# Patient Record
Sex: Female | Born: 1965 | Race: White | Hispanic: No | Marital: Single | Smoking: Never smoker
Health system: Southern US, Community
[De-identification: ages and names within clinical notes are randomized; demographics above are authoritative.]

## PROBLEM LIST (undated history)

## (undated) ENCOUNTER — Emergency Department (HOSPITAL_BASED_OUTPATIENT_CLINIC_OR_DEPARTMENT_OTHER): Admission: EM | Payer: Medicaid Other

## (undated) DIAGNOSIS — E669 Obesity, unspecified: Secondary | ICD-10-CM

## (undated) DIAGNOSIS — N390 Urinary tract infection, site not specified: Secondary | ICD-10-CM

## (undated) DIAGNOSIS — R87629 Unspecified abnormal cytological findings in specimens from vagina: Secondary | ICD-10-CM

## (undated) DIAGNOSIS — D649 Anemia, unspecified: Secondary | ICD-10-CM

## (undated) DIAGNOSIS — J189 Pneumonia, unspecified organism: Secondary | ICD-10-CM

## (undated) DIAGNOSIS — F99 Mental disorder, not otherwise specified: Secondary | ICD-10-CM

## (undated) DIAGNOSIS — C52 Malignant neoplasm of vagina: Secondary | ICD-10-CM

## (undated) DIAGNOSIS — T80219A Unspecified infection due to central venous catheter, initial encounter: Secondary | ICD-10-CM

## (undated) DIAGNOSIS — Z923 Personal history of irradiation: Secondary | ICD-10-CM

## (undated) DIAGNOSIS — F319 Bipolar disorder, unspecified: Secondary | ICD-10-CM

## (undated) DIAGNOSIS — F1010 Alcohol abuse, uncomplicated: Secondary | ICD-10-CM

## (undated) DIAGNOSIS — A4150 Gram-negative sepsis, unspecified: Secondary | ICD-10-CM

## (undated) DIAGNOSIS — D531 Other megaloblastic anemias, not elsewhere classified: Secondary | ICD-10-CM

## (undated) HISTORY — PX: ABDOMINAL HYSTERECTOMY: SHX81

## (undated) HISTORY — DX: Personal history of irradiation: Z92.3

## (undated) HISTORY — DX: Malignant neoplasm of vagina: C52

## (undated) HISTORY — PX: CHOLECYSTECTOMY: SHX55

---

## 2008-12-07 ENCOUNTER — Emergency Department (HOSPITAL_COMMUNITY): Admission: EM | Admit: 2008-12-07 | Discharge: 2008-12-07 | Payer: Self-pay | Admitting: Family Medicine

## 2009-03-06 ENCOUNTER — Emergency Department (HOSPITAL_COMMUNITY): Admission: EM | Admit: 2009-03-06 | Discharge: 2009-03-06 | Payer: Self-pay | Admitting: Emergency Medicine

## 2009-09-27 ENCOUNTER — Emergency Department (HOSPITAL_COMMUNITY): Admission: EM | Admit: 2009-09-27 | Discharge: 2009-09-27 | Payer: Self-pay | Admitting: Emergency Medicine

## 2009-09-28 ENCOUNTER — Inpatient Hospital Stay (HOSPITAL_COMMUNITY): Admission: RE | Admit: 2009-09-28 | Discharge: 2009-10-03 | Payer: Self-pay | Admitting: Psychiatry

## 2009-09-28 ENCOUNTER — Ambulatory Visit: Payer: Self-pay | Admitting: Psychiatry

## 2010-01-27 ENCOUNTER — Emergency Department (HOSPITAL_COMMUNITY): Admission: EM | Admit: 2010-01-27 | Discharge: 2010-01-27 | Payer: Self-pay | Admitting: Emergency Medicine

## 2010-04-11 ENCOUNTER — Encounter: Admission: RE | Admit: 2010-04-11 | Discharge: 2010-04-11 | Payer: Self-pay | Admitting: Family Medicine

## 2010-05-01 ENCOUNTER — Ambulatory Visit (HOSPITAL_COMMUNITY): Admission: RE | Admit: 2010-05-01 | Discharge: 2010-05-01 | Payer: Self-pay | Admitting: Obstetrics

## 2010-06-24 ENCOUNTER — Emergency Department (HOSPITAL_COMMUNITY): Admission: EM | Admit: 2010-06-24 | Discharge: 2010-06-25 | Payer: Self-pay | Admitting: Emergency Medicine

## 2010-07-19 ENCOUNTER — Emergency Department (HOSPITAL_COMMUNITY): Admission: EM | Admit: 2010-07-19 | Discharge: 2010-07-19 | Payer: Self-pay | Admitting: Emergency Medicine

## 2010-07-22 ENCOUNTER — Emergency Department (HOSPITAL_COMMUNITY): Admission: EM | Admit: 2010-07-22 | Discharge: 2010-07-22 | Payer: Self-pay | Admitting: Emergency Medicine

## 2010-08-31 ENCOUNTER — Emergency Department (HOSPITAL_COMMUNITY): Admission: EM | Admit: 2010-08-31 | Discharge: 2010-09-01 | Payer: Self-pay | Admitting: Emergency Medicine

## 2010-10-30 ENCOUNTER — Emergency Department (HOSPITAL_COMMUNITY)
Admission: EM | Admit: 2010-10-30 | Discharge: 2010-10-31 | Payer: Self-pay | Source: Home / Self Care | Admitting: Emergency Medicine

## 2010-11-05 LAB — URINALYSIS, ROUTINE W REFLEX MICROSCOPIC
Bilirubin Urine: NEGATIVE
Hgb urine dipstick: NEGATIVE
Ketones, ur: NEGATIVE mg/dL
Nitrite: POSITIVE — AB
Protein, ur: NEGATIVE mg/dL
Specific Gravity, Urine: 1.018 (ref 1.005–1.030)
Urine Glucose, Fasting: NEGATIVE mg/dL
Urobilinogen, UA: 1 mg/dL (ref 0.0–1.0)
pH: 7 (ref 5.0–8.0)

## 2010-11-05 LAB — CBC
HCT: 42.1 % (ref 36.0–46.0)
Hemoglobin: 14.1 g/dL (ref 12.0–15.0)
MCH: 30.2 pg (ref 26.0–34.0)
MCHC: 33.5 g/dL (ref 30.0–36.0)
MCV: 90.1 fL (ref 78.0–100.0)
Platelets: 228 10*3/uL (ref 150–400)
RBC: 4.67 MIL/uL (ref 3.87–5.11)
RDW: 14.6 % (ref 11.5–15.5)
WBC: 8.1 10*3/uL (ref 4.0–10.5)

## 2010-11-05 LAB — COMPREHENSIVE METABOLIC PANEL
ALT: 12 U/L (ref 0–35)
AST: 18 U/L (ref 0–37)
Albumin: 3.8 g/dL (ref 3.5–5.2)
Alkaline Phosphatase: 127 U/L — ABNORMAL HIGH (ref 39–117)
BUN: 9 mg/dL (ref 6–23)
CO2: 24 mEq/L (ref 19–32)
Calcium: 8.9 mg/dL (ref 8.4–10.5)
Chloride: 105 mEq/L (ref 96–112)
Creatinine, Ser: 0.8 mg/dL (ref 0.4–1.2)
GFR calc Af Amer: >60 mL/min (ref >60)
GFR calc non Af Amer: >60 mL/min (ref >60)
Glucose, Bld: 82 mg/dL (ref 70–99)
Potassium: 4.1 mEq/L (ref 3.5–5.1)
Sodium: 136 mEq/L (ref 135–145)
Total Bilirubin: 0.5 mg/dL (ref 0.3–1.2)
Total Protein: 7.1 g/dL (ref 6.0–8.3)

## 2010-11-05 LAB — DIFFERENTIAL
Basophils Absolute: 0 10*3/uL (ref 0.0–0.1)
Basophils Relative: 0 % (ref 0–1)
Eosinophils Absolute: 0 10*3/uL (ref 0.0–0.7)
Eosinophils Relative: 0 % (ref 0–5)
Lymphocytes Relative: 20 % (ref 12–46)
Lymphs Abs: 1.7 10*3/uL (ref 0.7–4.0)
Monocytes Absolute: 0.8 10*3/uL (ref 0.1–1.0)
Monocytes Relative: 10 % (ref 3–12)
Neutro Abs: 5.7 10*3/uL (ref 1.7–7.7)
Neutrophils Relative %: 70 % (ref 43–77)

## 2010-11-05 LAB — URINE MICROSCOPIC-ADD ON

## 2010-11-05 LAB — LIPASE, BLOOD: Lipase: 24 U/L (ref 11–59)

## 2010-11-05 LAB — PREGNANCY, URINE: Preg Test, Ur: NEGATIVE

## 2010-11-05 LAB — POCT PREGNANCY, URINE: Preg Test, Ur: NEGATIVE

## 2010-11-30 ENCOUNTER — Emergency Department (HOSPITAL_COMMUNITY)
Admission: EM | Admit: 2010-11-30 | Discharge: 2010-11-30 | Discharge status: Against Medical Advice | Payer: Medicaid Other | Attending: Emergency Medicine | Admitting: Emergency Medicine

## 2010-11-30 DIAGNOSIS — R197 Diarrhea, unspecified: Secondary | ICD-10-CM | POA: Insufficient documentation

## 2010-11-30 DIAGNOSIS — R1110 Vomiting, unspecified: Secondary | ICD-10-CM | POA: Insufficient documentation

## 2010-11-30 DIAGNOSIS — R109 Unspecified abdominal pain: Secondary | ICD-10-CM | POA: Insufficient documentation

## 2010-12-31 ENCOUNTER — Emergency Department (HOSPITAL_COMMUNITY)
Admission: EM | Admit: 2010-12-31 | Discharge: 2010-12-31 | Disposition: A | Discharge status: Home / Self Care | Payer: Medicaid Other | Attending: Emergency Medicine | Admitting: Emergency Medicine

## 2010-12-31 DIAGNOSIS — B372 Candidiasis of skin and nail: Secondary | ICD-10-CM | POA: Insufficient documentation

## 2010-12-31 DIAGNOSIS — R21 Rash and other nonspecific skin eruption: Secondary | ICD-10-CM | POA: Insufficient documentation

## 2011-01-01 LAB — HEPATIC FUNCTION PANEL
ALT: 11 U/L (ref 0–35)
AST: 16 U/L (ref 0–37)
Albumin: 3.4 g/dL — ABNORMAL LOW (ref 3.5–5.2)
Alkaline Phosphatase: 124 U/L — ABNORMAL HIGH (ref 39–117)
Bilirubin, Direct: 0.1 mg/dL (ref 0.0–0.3)
Indirect Bilirubin: 0.3 mg/dL (ref 0.3–0.9)
Total Bilirubin: 0.4 mg/dL (ref 0.3–1.2)
Total Protein: 6.6 g/dL (ref 6.0–8.3)

## 2011-01-01 LAB — URINALYSIS, ROUTINE W REFLEX MICROSCOPIC
Bilirubin Urine: NEGATIVE
Glucose, UA: NEGATIVE mg/dL
Hgb urine dipstick: NEGATIVE
Ketones, ur: NEGATIVE mg/dL
Nitrite: POSITIVE — AB
Protein, ur: NEGATIVE mg/dL
Specific Gravity, Urine: 1.029 (ref 1.005–1.030)
Urobilinogen, UA: 1 mg/dL (ref 0.0–1.0)
pH: 5.5 (ref 5.0–8.0)

## 2011-01-01 LAB — WET PREP, GENITAL
Trich, Wet Prep: NONE SEEN
Yeast Wet Prep HPF POC: NONE SEEN

## 2011-01-01 LAB — URINE CULTURE
Colony Count: >=100000
Culture  Setup Time: 201111121815

## 2011-01-01 LAB — CBC
HCT: 38.5 % (ref 36.0–46.0)
Hemoglobin: 12.7 g/dL (ref 12.0–15.0)
MCH: 28.9 pg (ref 26.0–34.0)
MCHC: 33 g/dL (ref 30.0–36.0)
MCV: 87.5 fL (ref 78.0–100.0)
Platelets: 220 10*3/uL (ref 150–400)
RBC: 4.4 MIL/uL (ref 3.87–5.11)
RDW: 14.3 % (ref 11.5–15.5)
WBC: 7.9 10*3/uL (ref 4.0–10.5)

## 2011-01-01 LAB — DIFFERENTIAL
Basophils Absolute: 0 10*3/uL (ref 0.0–0.1)
Basophils Relative: 0 % (ref 0–1)
Eosinophils Absolute: 0 10*3/uL (ref 0.0–0.7)
Eosinophils Relative: 0 % (ref 0–5)
Lymphocytes Relative: 20 % (ref 12–46)
Lymphs Abs: 1.6 10*3/uL (ref 0.7–4.0)
Monocytes Absolute: 0.7 10*3/uL (ref 0.1–1.0)
Monocytes Relative: 9 % (ref 3–12)
Neutro Abs: 5.6 10*3/uL (ref 1.7–7.7)
Neutrophils Relative %: 71 % (ref 43–77)

## 2011-01-01 LAB — BASIC METABOLIC PANEL
BUN: 8 mg/dL (ref 6–23)
CO2: 25 mEq/L (ref 19–32)
Calcium: 8.6 mg/dL (ref 8.4–10.5)
Chloride: 102 mEq/L (ref 96–112)
Creatinine, Ser: 0.85 mg/dL (ref 0.4–1.2)
GFR calc Af Amer: >60 mL/min (ref >60)
GFR calc non Af Amer: >60 mL/min (ref >60)
Glucose, Bld: 100 mg/dL — ABNORMAL HIGH (ref 70–99)
Potassium: 4.1 mEq/L (ref 3.5–5.1)
Sodium: 134 mEq/L — ABNORMAL LOW (ref 135–145)

## 2011-01-01 LAB — POCT PREGNANCY, URINE: Preg Test, Ur: NEGATIVE

## 2011-01-01 LAB — URINE MICROSCOPIC-ADD ON

## 2011-01-01 LAB — GC/CHLAMYDIA PROBE AMP, GENITAL
Chlamydia, DNA Probe: NEGATIVE
GC Probe Amp, Genital: NEGATIVE

## 2011-01-01 LAB — LIPASE, BLOOD: Lipase: 30 U/L (ref 11–59)

## 2011-01-03 LAB — URINALYSIS, ROUTINE W REFLEX MICROSCOPIC
Bilirubin Urine: NEGATIVE
Glucose, UA: NEGATIVE mg/dL
Ketones, ur: NEGATIVE mg/dL
Nitrite: POSITIVE — AB
Protein, ur: NEGATIVE mg/dL
Specific Gravity, Urine: 1.015 (ref 1.005–1.030)
Urobilinogen, UA: 1 mg/dL (ref 0.0–1.0)
pH: 6.5 (ref 5.0–8.0)

## 2011-01-03 LAB — URINE CULTURE
Colony Count: 85000
Culture  Setup Time: 201109300852

## 2011-01-03 LAB — URINE MICROSCOPIC-ADD ON

## 2011-01-03 LAB — POCT PREGNANCY, URINE: Preg Test, Ur: NEGATIVE

## 2011-01-03 LAB — GLUCOSE, CAPILLARY: Glucose-Capillary: 84 mg/dL (ref 70–99)

## 2011-01-09 LAB — URINALYSIS, ROUTINE W REFLEX MICROSCOPIC
Bilirubin Urine: NEGATIVE
Glucose, UA: NEGATIVE mg/dL
Hgb urine dipstick: NEGATIVE
Ketones, ur: NEGATIVE mg/dL
Nitrite: NEGATIVE
Protein, ur: NEGATIVE mg/dL
Specific Gravity, Urine: 1.024 (ref 1.005–1.030)
Urobilinogen, UA: 1 mg/dL (ref 0.0–1.0)
pH: 7 (ref 5.0–8.0)

## 2011-01-09 LAB — URINE MICROSCOPIC-ADD ON

## 2011-01-09 LAB — POCT PREGNANCY, URINE: Preg Test, Ur: NEGATIVE

## 2011-01-22 LAB — DIFFERENTIAL
Basophils Absolute: 0 10*3/uL (ref 0.0–0.1)
Basophils Relative: 0 % (ref 0–1)
Eosinophils Absolute: 0 10*3/uL (ref 0.0–0.7)
Eosinophils Relative: 0 % (ref 0–5)
Lymphocytes Relative: 24 % (ref 12–46)
Lymphs Abs: 1.9 10*3/uL (ref 0.7–4.0)
Monocytes Absolute: 0.7 10*3/uL (ref 0.1–1.0)
Monocytes Relative: 9 % (ref 3–12)
Neutro Abs: 5.1 10*3/uL (ref 1.7–7.7)
Neutrophils Relative %: 67 % (ref 43–77)

## 2011-01-22 LAB — URINE MICROSCOPIC-ADD ON

## 2011-01-22 LAB — URINE CULTURE
Colony Count: 60000
Special Requests: NEGATIVE

## 2011-01-22 LAB — COMPREHENSIVE METABOLIC PANEL
ALT: 11 U/L (ref 0–35)
AST: 13 U/L (ref 0–37)
Albumin: 3.8 g/dL (ref 3.5–5.2)
Alkaline Phosphatase: 108 U/L (ref 39–117)
BUN: 11 mg/dL (ref 6–23)
CO2: 25 mEq/L (ref 19–32)
Calcium: 8.6 mg/dL (ref 8.4–10.5)
Chloride: 102 mEq/L (ref 96–112)
Creatinine, Ser: 0.69 mg/dL (ref 0.4–1.2)
GFR calc Af Amer: >60 mL/min (ref >60)
GFR calc non Af Amer: >60 mL/min (ref >60)
Glucose, Bld: 114 mg/dL — ABNORMAL HIGH (ref 70–99)
Potassium: 3.5 mEq/L (ref 3.5–5.1)
Sodium: 134 mEq/L — ABNORMAL LOW (ref 135–145)
Total Bilirubin: 0.9 mg/dL (ref 0.3–1.2)
Total Protein: 7.5 g/dL (ref 6.0–8.3)

## 2011-01-22 LAB — URINALYSIS, ROUTINE W REFLEX MICROSCOPIC
Glucose, UA: NEGATIVE mg/dL
Hgb urine dipstick: NEGATIVE
Ketones, ur: NEGATIVE mg/dL
Nitrite: NEGATIVE
Protein, ur: NEGATIVE mg/dL
Specific Gravity, Urine: 1.03 (ref 1.005–1.030)
Urobilinogen, UA: 1 mg/dL (ref 0.0–1.0)
pH: 6 (ref 5.0–8.0)

## 2011-01-22 LAB — URINALYSIS, DIPSTICK ONLY
Bilirubin Urine: NEGATIVE
Glucose, UA: NEGATIVE mg/dL
Ketones, ur: NEGATIVE mg/dL
Nitrite: NEGATIVE
Protein, ur: NEGATIVE mg/dL
Specific Gravity, Urine: 1.031 — ABNORMAL HIGH (ref 1.005–1.030)
Urobilinogen, UA: 1 mg/dL (ref 0.0–1.0)
pH: 6 (ref 5.0–8.0)

## 2011-01-22 LAB — RAPID URINE DRUG SCREEN, HOSP PERFORMED
Amphetamines: NOT DETECTED
Barbiturates: NOT DETECTED
Benzodiazepines: POSITIVE — AB
Cocaine: NOT DETECTED
Opiates: NOT DETECTED
Tetrahydrocannabinol: NOT DETECTED

## 2011-01-22 LAB — CBC
HCT: 42.1 % (ref 36.0–46.0)
Hemoglobin: 14.5 g/dL (ref 12.0–15.0)
MCHC: 34.4 g/dL (ref 30.0–36.0)
MCV: 86.6 fL (ref 78.0–100.0)
Platelets: 234 10*3/uL (ref 150–400)
RBC: 4.86 MIL/uL (ref 3.87–5.11)
RDW: 13.6 % (ref 11.5–15.5)
WBC: 7.7 10*3/uL (ref 4.0–10.5)

## 2011-01-22 LAB — PREGNANCY, URINE: Preg Test, Ur: NEGATIVE

## 2011-01-22 LAB — ETHANOL: Alcohol, Ethyl (B): <5 mg/dL (ref 0–10)

## 2011-02-05 LAB — CBC
HCT: 41.2 % (ref 36.0–46.0)
Hemoglobin: 13.9 g/dL (ref 12.0–15.0)
MCHC: 33.7 g/dL (ref 30.0–36.0)
MCV: 90.8 fL (ref 78.0–100.0)
Platelets: 226 10*3/uL (ref 150–400)
RBC: 4.54 MIL/uL (ref 3.87–5.11)
RDW: 14.7 % (ref 11.5–15.5)
WBC: 8.4 10*3/uL (ref 4.0–10.5)

## 2011-02-05 LAB — DIFFERENTIAL
Basophils Absolute: 0 10*3/uL (ref 0.0–0.1)
Basophils Relative: 1 % (ref 0–1)
Eosinophils Absolute: 0 10*3/uL (ref 0.0–0.7)
Eosinophils Relative: 0 % (ref 0–5)
Lymphocytes Relative: 14 % (ref 12–46)
Lymphs Abs: 1.2 10*3/uL (ref 0.7–4.0)
Monocytes Absolute: 0.5 10*3/uL (ref 0.1–1.0)
Monocytes Relative: 6 % (ref 3–12)
Neutro Abs: 6.6 10*3/uL (ref 1.7–7.7)
Neutrophils Relative %: 79 % — ABNORMAL HIGH (ref 43–77)

## 2011-02-05 LAB — POCT I-STAT, CHEM 8
BUN: 16 mg/dL (ref 6–23)
Calcium, Ion: 1.17 mmol/L (ref 1.12–1.32)
Chloride: 108 mEq/L (ref 96–112)
Creatinine, Ser: 0.6 mg/dL (ref 0.4–1.2)
Glucose, Bld: 92 mg/dL (ref 70–99)
HCT: 44 % (ref 36.0–46.0)
Hemoglobin: 15 g/dL (ref 12.0–15.0)
Potassium: 4.2 mEq/L (ref 3.5–5.1)
Sodium: 139 mEq/L (ref 135–145)
TCO2: 24 mmol/L (ref 0–100)

## 2011-03-22 ENCOUNTER — Emergency Department (HOSPITAL_COMMUNITY)
Admission: EM | Admit: 2011-03-22 | Discharge: 2011-03-22 | Disposition: A | Discharge status: Home / Self Care | Payer: Medicaid Other | Attending: Emergency Medicine | Admitting: Emergency Medicine

## 2011-03-22 DIAGNOSIS — N390 Urinary tract infection, site not specified: Secondary | ICD-10-CM | POA: Insufficient documentation

## 2011-03-22 DIAGNOSIS — F319 Bipolar disorder, unspecified: Secondary | ICD-10-CM | POA: Insufficient documentation

## 2011-03-22 DIAGNOSIS — M545 Low back pain, unspecified: Secondary | ICD-10-CM | POA: Insufficient documentation

## 2011-03-22 DIAGNOSIS — R350 Frequency of micturition: Secondary | ICD-10-CM | POA: Insufficient documentation

## 2011-03-22 LAB — URINALYSIS, ROUTINE W REFLEX MICROSCOPIC
Bilirubin Urine: NEGATIVE
Glucose, UA: NEGATIVE mg/dL
Ketones, ur: NEGATIVE mg/dL
Nitrite: NEGATIVE
Protein, ur: NEGATIVE mg/dL
Specific Gravity, Urine: 1.025 (ref 1.005–1.030)
Urobilinogen, UA: 1 mg/dL (ref 0.0–1.0)
pH: 6 (ref 5.0–8.0)

## 2011-03-22 LAB — GLUCOSE, CAPILLARY: Glucose-Capillary: 101 mg/dL — ABNORMAL HIGH (ref 70–99)

## 2011-03-22 LAB — URINE MICROSCOPIC-ADD ON

## 2011-03-22 LAB — POCT PREGNANCY, URINE: Preg Test, Ur: NEGATIVE

## 2011-04-02 ENCOUNTER — Other Ambulatory Visit (HOSPITAL_COMMUNITY): Payer: Self-pay | Admitting: Obstetrics

## 2011-04-02 DIAGNOSIS — Z1231 Encounter for screening mammogram for malignant neoplasm of breast: Secondary | ICD-10-CM

## 2011-05-08 ENCOUNTER — Ambulatory Visit (HOSPITAL_COMMUNITY): Payer: Medicaid Other

## 2011-06-22 ENCOUNTER — Emergency Department (HOSPITAL_COMMUNITY)
Admission: EM | Admit: 2011-06-22 | Discharge: 2011-06-22 | Disposition: A | Discharge status: Home / Self Care | Payer: Medicaid Other | Attending: Emergency Medicine | Admitting: Emergency Medicine

## 2011-06-22 DIAGNOSIS — IMO0002 Reserved for concepts with insufficient information to code with codable children: Secondary | ICD-10-CM | POA: Insufficient documentation

## 2011-06-22 DIAGNOSIS — X58XXXA Exposure to other specified factors, initial encounter: Secondary | ICD-10-CM | POA: Insufficient documentation

## 2011-06-22 DIAGNOSIS — M545 Low back pain, unspecified: Secondary | ICD-10-CM | POA: Insufficient documentation

## 2011-06-22 DIAGNOSIS — F411 Generalized anxiety disorder: Secondary | ICD-10-CM | POA: Insufficient documentation

## 2011-07-20 ENCOUNTER — Inpatient Hospital Stay (EMERGENCY_DEPARTMENT_HOSPITAL)
Admission: AD | Admit: 2011-07-20 | Discharge: 2011-07-20 | Disposition: A | Discharge status: Home / Self Care | Payer: Medicaid Other | Source: Ambulatory Visit | Attending: Obstetrics & Gynecology | Admitting: Obstetrics & Gynecology

## 2011-07-20 ENCOUNTER — Encounter (HOSPITAL_COMMUNITY): Payer: Self-pay | Admitting: *Deleted

## 2011-07-20 DIAGNOSIS — N390 Urinary tract infection, site not specified: Secondary | ICD-10-CM

## 2011-07-20 DIAGNOSIS — R109 Unspecified abdominal pain: Secondary | ICD-10-CM | POA: Insufficient documentation

## 2011-07-20 DIAGNOSIS — M549 Dorsalgia, unspecified: Secondary | ICD-10-CM | POA: Insufficient documentation

## 2011-07-20 HISTORY — DX: Bipolar disorder, unspecified: F31.9

## 2011-07-20 HISTORY — DX: Mental disorder, not otherwise specified: F99

## 2011-07-20 LAB — URINALYSIS, ROUTINE W REFLEX MICROSCOPIC
Bilirubin Urine: NEGATIVE
Glucose, UA: NEGATIVE mg/dL
Ketones, ur: NEGATIVE mg/dL
Nitrite: POSITIVE — AB
Protein, ur: NEGATIVE mg/dL
Specific Gravity, Urine: >1.03 — ABNORMAL HIGH (ref 1.005–1.030)
Urobilinogen, UA: 1 mg/dL (ref 0.0–1.0)
pH: 6 (ref 5.0–8.0)

## 2011-07-20 LAB — URINE MICROSCOPIC-ADD ON

## 2011-07-20 LAB — POCT PREGNANCY, URINE: Preg Test, Ur: NEGATIVE

## 2011-07-20 MED ORDER — SULFAMETHOXAZOLE-TMP DS 800-160 MG PO TABS
1.0000 | ORAL_TABLET | Freq: Once | ORAL | Status: AC
  Administered 2011-07-20: 1 via ORAL
  Filled 2011-07-20: qty 1

## 2011-07-20 MED ORDER — ACETAMINOPHEN 325 MG PO TABS
650.0000 mg | ORAL_TABLET | Freq: Once | ORAL | Status: AC
  Administered 2011-07-20: 650 mg via ORAL
  Filled 2011-07-20: qty 2

## 2011-07-20 MED ORDER — SULFAMETHOXAZOLE-TMP DS 800-160 MG PO TABS: 1.0000 | ORAL_TABLET | Freq: Two times a day (BID) | ORAL | Status: AC

## 2011-07-20 NOTE — Progress Notes (Signed)
Pt states, " I started having pain in my low to mid back and low abdomen and I go to the bathroom more that 15-20 times per night. I also have vaginal tenderness and haven't had a period this month."

## 2011-07-20 NOTE — Progress Notes (Signed)
Written and verbal d/c instructions given and understanding voiced. 

## 2011-07-20 NOTE — Progress Notes (Signed)
Pain in bil lower back, lower abd, and funny feeling where urine comes out for 1.5wks. Crampy, constant pain. LMP middle of August.

## 2011-07-20 NOTE — ED Notes (Signed)
Pt tol crackers and liquids well.

## 2011-07-20 NOTE — ED Provider Notes (Signed)
History     Chief Complaint  Patient presents with  . Back Pain  . Abdominal Pain  . Vaginal Pain   HPI Jordan [name] 45 y.o. comes to [facility] tonight with abdominal pain and wondering if she is pregnant as she has not had menses in Sept.  Denies any problems with vaginal discharge.  No vaginal bleeding.  Denies any dysuria, but states she has had UTIs previously.   OB History    Grav Para Term Preterm Abortions TAB SAB Ect Mult Living   1         1      Past Medical History  Diagnosis Date  . Mental disorder   . Bipolar 1 disorder     Past Surgical History  Procedure Date  . No past surgeries     No family history on file.  History  Substance Use Topics  . Smoking status: Never Smoker   . Smokeless tobacco: Not on file  . Alcohol Use: No    Allergies:  Allergies  Allergen Reactions  . Penicillins Swelling    Swelling of lips    Prescriptions prior to admission  Medication Sig Dispense Refill  . lamoTRIgine (LAMICTAL) 25 MG tablet Take 50 mg by mouth every morning.        Marland Kitchen PARoxetine (PAXIL) 30 MG tablet Take 30 mg by mouth every morning.          Review of Systems  Gastrointestinal: Positive for abdominal pain. Negative for nausea and vomiting.  Genitourinary: Negative for dysuria, urgency and frequency.  Musculoskeletal: Negative for back pain.   Physical Exam   Blood pressure 123/91, pulse 91, temperature 99.3 F (37.4 C), temperature source Oral, resp. rate 20, height 5\' 8"  (1.727 m), weight 342 lb 2 oz (155.187 kg), last menstrual period 05/28/2011.  Physical Exam  Nursing note and vitals reviewed. Constitutional: She is oriented to person, place, and time. She appears well-developed and well-nourished.       Morbid obesity  HENT:  Head: Normocephalic.  Eyes: EOM are normal.  Neck: Neck supple.  GI: Soft. There is tenderness. There is no rebound and no guarding.  Genitourinary:       Declines a pelvic exam  Musculoskeletal: Normal range of  motion.  Neurological: She is alert and oriented to person, place, and time.  Skin: Skin is warm and dry.  Psychiatric: She has a normal mood and affect.    MAU Course  Procedures  MDM Results for orders placed during the hospital encounter of 07/20/11 (from the past 24 hour(s))  URINALYSIS, ROUTINE W REFLEX MICROSCOPIC     Status: Abnormal   Collection Time   07/20/11  8:30 PM      Component Value Range   Color, Urine YELLOW  YELLOW    Appearance CLEAR  CLEAR    Specific Gravity, Urine >1.030 (*) 1.005 - 1.030    pH 6.0  5.0 - 8.0    Glucose, UA NEGATIVE  NEGATIVE (mg/dL)   Hgb urine dipstick SMALL (*) NEGATIVE    Bilirubin Urine NEGATIVE  NEGATIVE    Ketones, ur NEGATIVE  NEGATIVE (mg/dL)   Protein, ur NEGATIVE  NEGATIVE (mg/dL)   Urobilinogen, UA 1.0  0.0 - 1.0 (mg/dL)   Nitrite POSITIVE (*) NEGATIVE    Leukocytes, UA SMALL (*) NEGATIVE   URINE MICROSCOPIC-ADD ON     Status: Abnormal   Collection Time   07/20/11  8:30 PM      Component Value Range  Squamous Epithelial / LPF FEW (*) RARE    WBC, UA 11-20  <3 (WBC/hpf)   RBC / HPF 0-2  <3 (RBC/hpf)   Bacteria, UA FEW (*) RARE    Urine-Other MUCOUS PRESENT    POCT PREGNANCY, URINE     Status: Normal   Collection Time   07/20/11  8:41 PM      Component Value Range   Preg Test, Ur NEGATIVE       Assessment and Plan  Urinary tract infection  Plan One dose of Septra DS given here Increase fluid intake See your doctor for follow up.  Client knows location of doctor but not the doctor's name. Pregnancy test is negative today.  BURLESON,TERRI 07/20/2011, 9:31 PM   Nolene Bernheim, NP 07/20/11 2222

## 2011-07-21 ENCOUNTER — Observation Stay (HOSPITAL_COMMUNITY)
Admission: EM | Admit: 2011-07-21 | Discharge: 2011-07-21 | Disposition: A | Discharge status: Home / Self Care | Payer: Medicaid Other | Attending: Emergency Medicine | Admitting: Emergency Medicine

## 2011-07-21 DIAGNOSIS — N390 Urinary tract infection, site not specified: Secondary | ICD-10-CM | POA: Insufficient documentation

## 2011-07-21 DIAGNOSIS — R220 Localized swelling, mass and lump, head: Principal | ICD-10-CM | POA: Insufficient documentation

## 2011-07-21 DIAGNOSIS — T370X5A Adverse effect of sulfonamides, initial encounter: Secondary | ICD-10-CM | POA: Insufficient documentation

## 2011-07-21 DIAGNOSIS — Y92009 Unspecified place in unspecified non-institutional (private) residence as the place of occurrence of the external cause: Secondary | ICD-10-CM | POA: Insufficient documentation

## 2011-07-21 DIAGNOSIS — R221 Localized swelling, mass and lump, neck: Secondary | ICD-10-CM | POA: Insufficient documentation

## 2011-07-21 DIAGNOSIS — R51 Headache: Secondary | ICD-10-CM | POA: Insufficient documentation

## 2011-07-21 LAB — URINALYSIS, ROUTINE W REFLEX MICROSCOPIC
Bilirubin Urine: NEGATIVE
Glucose, UA: NEGATIVE mg/dL
Hgb urine dipstick: NEGATIVE
Ketones, ur: NEGATIVE mg/dL
Nitrite: POSITIVE — AB
Protein, ur: NEGATIVE mg/dL
Specific Gravity, Urine: 1.02 (ref 1.005–1.030)
Urobilinogen, UA: 2 mg/dL — ABNORMAL HIGH (ref 0.0–1.0)
pH: 6.5 (ref 5.0–8.0)

## 2011-07-21 LAB — URINE MICROSCOPIC-ADD ON

## 2011-08-31 ENCOUNTER — Emergency Department (HOSPITAL_COMMUNITY)
Admission: EM | Admit: 2011-08-31 | Discharge: 2011-08-31 | Disposition: A | Discharge status: Home / Self Care | Payer: Medicaid Other | Attending: Emergency Medicine | Admitting: Emergency Medicine

## 2011-08-31 ENCOUNTER — Encounter (HOSPITAL_COMMUNITY): Payer: Self-pay

## 2011-08-31 DIAGNOSIS — A499 Bacterial infection, unspecified: Secondary | ICD-10-CM | POA: Insufficient documentation

## 2011-08-31 DIAGNOSIS — M545 Low back pain, unspecified: Secondary | ICD-10-CM | POA: Insufficient documentation

## 2011-08-31 DIAGNOSIS — N760 Acute vaginitis: Secondary | ICD-10-CM | POA: Insufficient documentation

## 2011-08-31 DIAGNOSIS — B9689 Other specified bacterial agents as the cause of diseases classified elsewhere: Secondary | ICD-10-CM | POA: Insufficient documentation

## 2011-08-31 LAB — WET PREP, GENITAL
Trich, Wet Prep: NONE SEEN
Yeast Wet Prep HPF POC: NONE SEEN

## 2011-08-31 LAB — URINALYSIS, ROUTINE W REFLEX MICROSCOPIC
Glucose, UA: NEGATIVE mg/dL
Ketones, ur: NEGATIVE mg/dL
Nitrite: NEGATIVE
Protein, ur: 30 mg/dL — AB
Specific Gravity, Urine: 1.038 — ABNORMAL HIGH (ref 1.005–1.030)
Urobilinogen, UA: 1 mg/dL (ref 0.0–1.0)
pH: 5.5 (ref 5.0–8.0)

## 2011-08-31 LAB — GLUCOSE, CAPILLARY: Glucose-Capillary: 87 mg/dL (ref 70–99)

## 2011-08-31 LAB — URINE MICROSCOPIC-ADD ON

## 2011-08-31 LAB — POCT PREGNANCY, URINE: Preg Test, Ur: NEGATIVE

## 2011-08-31 MED ORDER — METRONIDAZOLE 500 MG PO TABS
500.0000 mg | ORAL_TABLET | Freq: Once | ORAL | Status: AC
  Administered 2011-08-31: 500 mg via ORAL
  Filled 2011-08-31: qty 1

## 2011-08-31 MED ORDER — METRONIDAZOLE 500 MG PO TABS: 500.0000 mg | ORAL_TABLET | Freq: Two times a day (BID) | ORAL | Status: AC

## 2011-08-31 NOTE — ED Notes (Signed)
Pt has no discharge but has noticed an unusual odor in her vaginal area, and itching----x 4 months.  She has a PCP but did not go there to be seen.  "I thought it would go away."  She arrived by ambulance from [place].

## 2011-08-31 NOTE — ED Notes (Signed)
Pt presents with [facility] in no acute distress.  Reports vaginal itching and burning for 4 months.  Denies abdominal pain and discharge. HX of yeast infections that when present she gets depressed.  Pt on scene reports chest pain sternal only hurts to breathe and with palpation

## 2011-08-31 NOTE — ED Notes (Signed)
Family at bedside. 

## 2011-08-31 NOTE — ED Provider Notes (Signed)
History     CSN: [id number] Arrival date & time: 08/31/2011  5:45 PM   First MD Initiated Contact with Patient 08/31/11 1914      Chief Complaint  Patient presents with  . Vaginal Itching    burning     (Consider location/radiation/quality/duration/timing/severity/associated sxs/prior treatment) Patient is a 45 y.o. female presenting with vaginal itching. The history is provided by the patient.  Vaginal Itching  Patient states that she's been having vaginal itching and burning as well as a vaginal odor for the last 4 months. She states that she's had this before and she uses prayer. Symptoms are always gotten better with fair, but they have not gotten better this time. She's also been having pain in her mid to lower back which also has not responded to the prayer. She denies any vaginal discharge or bleeding. Last menses was in mid September. She denies using any kind of contraception. She rates her pain at 5/10. Nothing makes her symptoms better, nothing makes them worse. She has not tried any other treatments for her pain or further vaginal itching or burning. She does relate that she will use seen at [facility] about a month ago and given an antibiotic which caused facial swelling and she had to be seen at [facility] the next day. She does not remember what the antibiotic was.  Past Medical History  Diagnosis Date  . Mental disorder   . Bipolar 1 disorder   . Bipolar 1 disorder   . Yeast infection of the vagina     Past Surgical History  Procedure Date  . No past surgeries     No family history on file.  History  Substance Use Topics  . Smoking status: Never Smoker   . Smokeless tobacco: Not on file  . Alcohol Use: No    OB History    Grav Para Term Preterm Abortions TAB SAB Ect Mult Living   1         1      Review of Systems  All other systems reviewed and are negative.    Allergies  Penicillins  Home Medications   Current Outpatient Rx    Name Route Sig Dispense Refill  . LAMOTRIGINE 25 MG PO TABS Oral Take 50 mg by mouth every morning.      Marland Kitchen PAROXETINE HCL 30 MG PO TABS Oral Take 30 mg by mouth every morning.        BP 122/78  Pulse 80  Temp(Src) 97.7 F (36.5 C) (Oral)  Resp 21  SpO2 97%  Physical Exam  Nursing note and vitals reviewed.  45 year old female resting comfortably and in no acute distress. She is moderately obese. Vital signs are normal. Head is normocephalic and atraumatic. PERRLA, EOMI. Neck is supple without adenopathy. Lungs are clear without rales wheezes or rhonchi. Heart regular rate and rhythm without murmur. Abdomen is obese soft and nontender with peristalsis present. Back there is moderate tenderness to the mid lumbar area without paralumbar spasm. There is a negative straight leg raise. Pelvic: Normal external female genitalia, cervix is normal, there is a moderate amount of a white to slightly yellowish vaginal discharge present, as no cervical motion tenderness, no adnexal masses or tenderness, is that this is normal size and position. Extremities have no cyanosis or edema. Neurologic: Mental status is normal, cranial nerves are intact, there is no focal motor or sensory deficits. Psychiatric: No abnormalities of mood or affect.  ED Course  Procedures (  including critical care time) Results for orders placed during the hospital encounter of 08/31/11  WET PREP, GENITAL      Component Value Range   Yeast, Wet Prep NONE SEEN  NONE SEEN    Trich, Wet Prep NONE SEEN  NONE SEEN    Clue Cells, Wet Prep FEW (*) NONE SEEN    WBC, Wet Prep HPF POC MANY (*) NONE SEEN   URINALYSIS, ROUTINE W REFLEX MICROSCOPIC      Component Value Range   Color, Urine AMBER (*) YELLOW    Appearance TURBID (*) CLEAR    Specific Gravity, Urine 1.038 (*) 1.005 - 1.030    pH 5.5  5.0 - 8.0    Glucose, UA NEGATIVE  NEGATIVE (mg/dL)   Hgb urine dipstick TRACE (*) NEGATIVE    Bilirubin Urine SMALL (*) NEGATIVE    Ketones,  ur NEGATIVE  NEGATIVE (mg/dL)   Protein, ur 30 (*) NEGATIVE (mg/dL)   Urobilinogen, UA 1.0  0.0 - 1.0 (mg/dL)   Nitrite NEGATIVE  NEGATIVE    Leukocytes, UA SMALL (*) NEGATIVE   GLUCOSE, CAPILLARY      Component Value Range   Glucose-Capillary 87  70 - 99 (mg/dL)  POCT PREGNANCY, URINE      Component Value Range   Preg Test, Ur NEGATIVE    URINE MICROSCOPIC-ADD ON      Component Value Range   Squamous Epithelial / LPF MANY (*) RARE    WBC, UA TOO NUMEROUS TO COUNT  <3 (WBC/hpf)   Bacteria, UA MANY (*) RARE    Urine-Other MUCOUS PRESENT     In spite of only a small number of clue cells present, the patient's symptoms are strongly suggestive of bacterial vaginosis and we'll treat empirically with metronidazole    MDM  The back pain, probably musculoskeletal. Vaginal itching and odor most likely bacterial vaginosis.        Dione Booze, MD 08/31/11 2219

## 2011-08-31 NOTE — ED Notes (Signed)
Pt provided a urine specimen upon arrival to ED

## 2011-09-01 LAB — RPR: RPR Ser Ql: NONREACTIVE

## 2011-09-02 LAB — GC/CHLAMYDIA PROBE AMP, GENITAL
Chlamydia, DNA Probe: NEGATIVE
GC Probe Amp, Genital: NEGATIVE

## 2011-09-21 ENCOUNTER — Encounter (HOSPITAL_COMMUNITY): Payer: Self-pay | Admitting: *Deleted

## 2011-09-21 ENCOUNTER — Emergency Department (HOSPITAL_COMMUNITY)
Admission: EM | Admit: 2011-09-21 | Discharge: 2011-09-22 | Disposition: A | Discharge status: Home / Self Care | Payer: Medicaid Other | Attending: Emergency Medicine | Admitting: Emergency Medicine

## 2011-09-21 DIAGNOSIS — N760 Acute vaginitis: Secondary | ICD-10-CM | POA: Insufficient documentation

## 2011-09-21 DIAGNOSIS — T148XXA Other injury of unspecified body region, initial encounter: Secondary | ICD-10-CM | POA: Insufficient documentation

## 2011-09-21 DIAGNOSIS — X58XXXA Exposure to other specified factors, initial encounter: Secondary | ICD-10-CM | POA: Insufficient documentation

## 2011-09-21 DIAGNOSIS — B9689 Other specified bacterial agents as the cause of diseases classified elsewhere: Secondary | ICD-10-CM | POA: Insufficient documentation

## 2011-09-21 DIAGNOSIS — A499 Bacterial infection, unspecified: Secondary | ICD-10-CM | POA: Insufficient documentation

## 2011-09-21 LAB — URINE MICROSCOPIC-ADD ON

## 2011-09-21 LAB — WET PREP, GENITAL
Trich, Wet Prep: NONE SEEN
Yeast Wet Prep HPF POC: NONE SEEN

## 2011-09-21 LAB — URINALYSIS, ROUTINE W REFLEX MICROSCOPIC
Glucose, UA: NEGATIVE mg/dL
Nitrite: NEGATIVE
Protein, ur: 30 mg/dL — AB
Specific Gravity, Urine: 1.03 (ref 1.005–1.030)
Urobilinogen, UA: 1 mg/dL (ref 0.0–1.0)
pH: 5.5 (ref 5.0–8.0)

## 2011-09-21 LAB — POCT PREGNANCY, URINE: Preg Test, Ur: NEGATIVE

## 2011-09-21 MED ORDER — HYDROCODONE-ACETAMINOPHEN 5-325 MG PO TABS
1.0000 | ORAL_TABLET | Freq: Once | ORAL | Status: AC
  Administered 2011-09-22: 1 via ORAL
  Filled 2011-09-21: qty 1

## 2011-09-21 MED ORDER — IBUPROFEN 800 MG PO TABS
800.0000 mg | ORAL_TABLET | Freq: Once | ORAL | Status: AC
  Administered 2011-09-22: 800 mg via ORAL
  Filled 2011-09-21: qty 1

## 2011-09-21 NOTE — ED Notes (Signed)
U*rine collected waiting for order to be placed

## 2011-09-21 NOTE — ED Notes (Signed)
[place] with c/o vaginal itching on inside and outside, for 7-8 months, states she shaved the area today to help take care of the itching "I thought it would fix it "

## 2011-09-22 MED ORDER — METRONIDAZOLE 500 MG PO TABS: 500.0000 mg | ORAL_TABLET | Freq: Two times a day (BID) | ORAL | Status: DC

## 2011-09-22 MED ORDER — METRONIDAZOLE 500 MG PO TABS: 500.0000 mg | ORAL_TABLET | Freq: Two times a day (BID) | ORAL | Status: AC

## 2011-09-22 NOTE — ED Provider Notes (Signed)
History     CSN: [id number] Arrival date & time: 09/21/2011  6:14 PM   First MD Initiated Contact with Patient 09/21/11 2140      Chief Complaint  Patient presents with  . Vaginal Itching     HPI: Reports vaginal itching and odor that she has had for 8 months. Admits to unprotected intercourse w/ her boyfriend. Denies lower abdominal pain, fever, nausea, vomiting or diarrhea. Also reports   Past Medical History  Diagnosis Date  . Mental disorder   . Bipolar 1 disorder   . Bipolar 1 disorder   . Yeast infection of the vagina     Past Surgical History  Procedure Date  . No past surgeries     No family history on file.  History  Substance Use Topics  . Smoking status: Never Smoker   . Smokeless tobacco: Not on file  . Alcohol Use: No    OB History    Grav Para Term Preterm Abortions TAB SAB Ect Mult Living   1         1      Review of Systems  Constitutional: Negative.   HENT: Negative.   Eyes: Negative.   Respiratory: Negative.   Cardiovascular: Negative.   Gastrointestinal: Negative.   Genitourinary: Negative.   Musculoskeletal: Negative.   Skin: Negative.   Neurological: Negative.   Hematological: Negative.   Psychiatric/Behavioral: Negative.     Allergies  Sulfa antibiotics and Tramadol  Home Medications   Current Outpatient Rx  Name Route Sig Dispense Refill  . CITALOPRAM HYDROBROMIDE 20 MG PO TABS Oral Take 20 mg by mouth daily.      . IBUPROFEN 800 MG PO TABS Oral Take 800 mg by mouth every 8 (eight) hours as needed. Menstrual pain     . LAMOTRIGINE 25 MG PO TABS Oral Take 50 mg by mouth every morning.        BP 164/88  Pulse 96  Temp(Src) 99.1 F (37.3 C) (Oral)  Resp 20  Wt 344 lb 12.8 oz (156.4 kg)  SpO2 93%  Physical Exam  Constitutional: She is oriented to person, place, and time. She appears well-developed and well-nourished.  HENT:  Head: Normocephalic and atraumatic.  Eyes: Conjunctivae are normal.  Neck: Normal range of  motion.  Cardiovascular: Normal rate and regular rhythm.   Pulmonary/Chest: Effort normal and breath sounds normal.  Genitourinary: Vagina normal.       Moderate amount vaginal bleeding. Vaginal odor noted. No CMT or other abnormalities appreciated during pelvic exam.  Musculoskeletal: Normal range of motion.       Arms: Neurological: She is alert and oriented to person, place, and time. She has normal reflexes.  Skin: Skin is warm and dry.  Psychiatric: She has a normal mood and affect.    ED Course  Procedures Findings, impression and d/c plan discussed w/ pt who is agreeable.  Labs Reviewed  URINALYSIS, ROUTINE W REFLEX MICROSCOPIC - Abnormal; Notable for the following:    Color, Urine AMBER (*) BIOCHEMICALS MAY BE AFFECTED BY COLOR   APPearance CLOUDY (*)    Hgb urine dipstick LARGE (*)    Bilirubin Urine SMALL (*)    Ketones, ur TRACE (*)    Protein, ur 30 (*)    Leukocytes, UA SMALL (*)    All other components within normal limits  WET PREP, GENITAL - Abnormal; Notable for the following:    Clue Cells, Wet Prep RARE (*)    WBC, Wet Prep  HPF POC MODERATE (*)    All other components within normal limits  URINE MICROSCOPIC-ADD ON - Abnormal; Notable for the following:    Squamous Epithelial / LPF MANY (*)    Bacteria, UA FEW (*)    All other components within normal limits  POCT PREGNANCY, URINE  POCT PREGNANCY, URINE  GC/CHLAMYDIA PROBE AMP, GENITAL   No results found.   No diagnosis found.    MDM  Bacterial Vaginosis Muscle strain        Leanne Chang, NP 09/22/11 0025

## 2011-09-22 NOTE — ED Notes (Signed)
Pelvic exam done per catherine

## 2011-09-22 NOTE — ED Provider Notes (Signed)
Medical screening examination/treatment/procedure(s) were performed by non-physician practitioner and as supervising physician I was immediately available for consultation/collaboration.  Holton Sidman R Tishana Clinkenbeard, MD 09/22/11 0047 

## 2011-09-23 LAB — GC/CHLAMYDIA PROBE AMP, GENITAL
Chlamydia, DNA Probe: NEGATIVE
GC Probe Amp, Genital: NEGATIVE

## 2011-11-07 ENCOUNTER — Encounter (HOSPITAL_COMMUNITY): Payer: Self-pay | Admitting: *Deleted

## 2011-11-07 ENCOUNTER — Emergency Department (HOSPITAL_COMMUNITY)
Admission: EM | Admit: 2011-11-07 | Discharge: 2011-11-07 | Disposition: A | Discharge status: Home / Self Care | Payer: Medicaid Other | Attending: Emergency Medicine | Admitting: Emergency Medicine

## 2011-11-07 ENCOUNTER — Emergency Department (HOSPITAL_COMMUNITY): Payer: Medicaid Other

## 2011-11-07 DIAGNOSIS — F319 Bipolar disorder, unspecified: Secondary | ICD-10-CM | POA: Insufficient documentation

## 2011-11-07 DIAGNOSIS — F489 Nonpsychotic mental disorder, unspecified: Secondary | ICD-10-CM | POA: Insufficient documentation

## 2011-11-07 DIAGNOSIS — R296 Repeated falls: Secondary | ICD-10-CM | POA: Insufficient documentation

## 2011-11-07 DIAGNOSIS — S90129A Contusion of unspecified lesser toe(s) without damage to nail, initial encounter: Secondary | ICD-10-CM | POA: Insufficient documentation

## 2011-11-07 DIAGNOSIS — M79609 Pain in unspecified limb: Secondary | ICD-10-CM | POA: Insufficient documentation

## 2011-11-07 MED ORDER — IBUPROFEN 800 MG PO TABS
800.0000 mg | ORAL_TABLET | Freq: Once | ORAL | Status: AC
  Administered 2011-11-07: 800 mg via ORAL
  Filled 2011-11-07: qty 1

## 2011-11-07 MED ORDER — IBUPROFEN 800 MG PO TABS: 800.0000 mg | ORAL_TABLET | Freq: Three times a day (TID) | ORAL | Status: AC

## 2011-11-07 NOTE — ED Provider Notes (Signed)
History     CSN: [id number]  Arrival date & time 11/07/11  1919   First MD Initiated Contact with Patient 11/07/11 2026      Chief Complaint  Patient presents with  . Fall  . Toe Pain    (Consider location/radiation/quality/duration/timing/severity/associated sxs/prior treatment) Patient is a 46 y.o. female presenting with fall and toe pain. The history is provided by the patient.  Fall The fall occurred while walking. Pertinent negatives include no numbness and no headaches.  Toe Pain Pertinent negatives include no headaches.  Pt fell through floor while walking injuring the 5th digit of the R foot. No deformity. C/o pain with ROM and bruising. No head, neck injury. No numbness or weakness  Past Medical History  Diagnosis Date  . Mental disorder   . Bipolar 1 disorder   . Bipolar 1 disorder   . Yeast infection of the vagina     Past Surgical History  Procedure Date  . No past surgeries     History reviewed. No pertinent family history.  History  Substance Use Topics  . Smoking status: Never Smoker   . Smokeless tobacco: Not on file  . Alcohol Use: No    OB History    Grav Para Term Preterm Abortions TAB SAB Ect Mult Living   1         1      Review of Systems  HENT: Positive for neck pain.   Musculoskeletal: Positive for joint swelling.  Neurological: Negative for weakness, numbness and headaches.    Allergies  Sulfa antibiotics and Tramadol  Home Medications   Current Outpatient Rx  Name Route Sig Dispense Refill  . CITALOPRAM HYDROBROMIDE 20 MG PO TABS Oral Take 20 mg by mouth daily.      . IBUPROFEN 800 MG PO TABS Oral Take 800 mg by mouth every 8 (eight) hours as needed. Menstrual pain     . LAMOTRIGINE 25 MG PO TABS Oral Take 50 mg by mouth every morning.      . IBUPROFEN 800 MG PO TABS Oral Take 1 tablet (800 mg total) by mouth 3 (three) times daily. 21 tablet 0    BP 122/61  Pulse 86  Temp(Src) 97.5 F (36.4 C) (Oral)  Resp 20  SpO2  94%  LMP 10/17/2011  Physical Exam  Constitutional: She is oriented to person, place, and time. She appears well-developed and well-nourished. No distress.  HENT:  Head: Normocephalic and atraumatic.  Neck: Normal range of motion.  Pulmonary/Chest: Effort normal.  Abdominal: Soft.  Musculoskeletal:       Mild edema and bruising to 5 digit of R foot without deformity  Neurological: She is alert and oriented to person, place, and time.       Moves all ext, no gross deficits    ED Course  Procedures (including critical care time)  Labs Reviewed - No data to display Dg Toe 5th Right  11/07/2011  *RADIOLOGY REPORT*  Clinical Data: Right fifth toe injury.  RIGHT FIFTH TOE  Comparison:  None.  Findings:  There is no evidence of fracture or dislocation.  There is no evidence of arthropathy or other focal bone abnormality. Soft tissues are unremarkable.  IMPRESSION: Negative.  Original Report Authenticated By: Reola Calkins, M.D.     1. Toe contusion       MDM           Loren Racer, MD 11/07/11 2106

## 2011-11-07 NOTE — ED Notes (Signed)
Ortho to bedside.  Pt given soda and crackers with motrin.

## 2011-11-07 NOTE — ED Notes (Addendum)
Fell thru b/r floor at ~ 1130. Bilateral legs went thru floor. C/o R little toe pain. Swelling started ~ 30 minutes ago. rates pain 10/10. No meds PTA.

## 2012-02-26 ENCOUNTER — Emergency Department (HOSPITAL_COMMUNITY)
Admission: EM | Admit: 2012-02-26 | Discharge: 2012-02-26 | Disposition: A | Discharge status: Home / Self Care | Payer: Medicaid Other | Attending: Emergency Medicine | Admitting: Emergency Medicine

## 2012-02-26 ENCOUNTER — Encounter (HOSPITAL_COMMUNITY): Payer: Self-pay | Admitting: *Deleted

## 2012-02-26 DIAGNOSIS — N390 Urinary tract infection, site not specified: Secondary | ICD-10-CM | POA: Insufficient documentation

## 2012-02-26 DIAGNOSIS — R109 Unspecified abdominal pain: Secondary | ICD-10-CM | POA: Insufficient documentation

## 2012-02-26 DIAGNOSIS — R10819 Abdominal tenderness, unspecified site: Secondary | ICD-10-CM | POA: Insufficient documentation

## 2012-02-26 LAB — CBC
HCT: 37.7 % (ref 36.0–46.0)
Hemoglobin: 12.8 g/dL (ref 12.0–15.0)
MCH: 31.6 pg (ref 26.0–34.0)
MCHC: 34 g/dL (ref 30.0–36.0)
MCV: 93.1 fL (ref 78.0–100.0)
Platelets: 195 10*3/uL (ref 150–400)
RBC: 4.05 MIL/uL (ref 3.87–5.11)
RDW: 15.4 % (ref 11.5–15.5)
WBC: 5.4 10*3/uL (ref 4.0–10.5)

## 2012-02-26 LAB — URINALYSIS, ROUTINE W REFLEX MICROSCOPIC
Bilirubin Urine: NEGATIVE
Glucose, UA: NEGATIVE mg/dL
Ketones, ur: NEGATIVE mg/dL
Nitrite: POSITIVE — AB
Protein, ur: 30 mg/dL — AB
Specific Gravity, Urine: 1.016 (ref 1.005–1.030)
Urobilinogen, UA: 1 mg/dL (ref 0.0–1.0)
pH: 5.5 (ref 5.0–8.0)

## 2012-02-26 LAB — POCT I-STAT, CHEM 8
BUN: 12 mg/dL (ref 6–23)
Calcium, Ion: 1.2 mmol/L (ref 1.12–1.32)
Chloride: 106 mEq/L (ref 96–112)
Creatinine, Ser: 0.8 mg/dL (ref 0.50–1.10)
Glucose, Bld: 79 mg/dL (ref 70–99)
HCT: 36 % (ref 36.0–46.0)
Hemoglobin: 12.2 g/dL (ref 12.0–15.0)
Potassium: 3.8 mEq/L (ref 3.5–5.1)
Sodium: 141 mEq/L (ref 135–145)
TCO2: 27 mmol/L (ref 0–100)

## 2012-02-26 LAB — URINE MICROSCOPIC-ADD ON

## 2012-02-26 MED ORDER — PHENAZOPYRIDINE HCL 200 MG PO TABS: 200.0000 mg | ORAL_TABLET | Freq: Three times a day (TID) | ORAL | Status: DC

## 2012-02-26 MED ORDER — CEFUROXIME AXETIL 250 MG PO TABS: 250.0000 mg | ORAL_TABLET | Freq: Two times a day (BID) | ORAL | Status: AC

## 2012-02-26 MED ORDER — PHENAZOPYRIDINE HCL 200 MG PO TABS: 200.0000 mg | ORAL_TABLET | Freq: Three times a day (TID) | ORAL | Status: AC

## 2012-02-26 MED ORDER — KETOROLAC TROMETHAMINE 30 MG/ML IJ SOLN
30.0000 mg | Freq: Once | INTRAMUSCULAR | Status: AC
  Administered 2012-02-26: 30 mg via INTRAVENOUS
  Filled 2012-02-26: qty 1

## 2012-02-26 MED ORDER — DEXTROSE 5 % IV SOLN
1.0000 g | INTRAVENOUS | Status: DC
  Administered 2012-02-26: 1 g via INTRAVENOUS
  Filled 2012-02-26: qty 10

## 2012-02-26 NOTE — Discharge Instructions (Signed)

## 2012-02-26 NOTE — ED Notes (Signed)
Rocephin medication was completed.

## 2012-02-26 NOTE — ED Provider Notes (Signed)
History     CSN: [id number]  Arrival date & time 02/26/12  1813   First MD Initiated Contact with Patient 02/26/12 2007      Chief Complaint  Patient presents with  . Abdominal Pain  . Extremity Pain    HPI The patient presents to the emergency room with complaints of body aches, flank pain and abdominal pain. The patient states the symptoms started a few days ago. She has been urinating very frequently. She states the pain is primarily in the right flank. Nothing seems to make it better or worse. She has not had any nausea, vomiting or diarrhea. She denies any fevers or cough. She states the pain is severe at times it radiates down towards her lower abdomen. Past Medical History  Diagnosis Date  . Mental disorder   . Bipolar 1 disorder   . Bipolar 1 disorder   . Yeast infection of the vagina     Past Surgical History  Procedure Date  . No past surgeries     History reviewed. No pertinent family history.  History  Substance Use Topics  . Smoking status: Never Smoker   . Smokeless tobacco: Not on file  . Alcohol Use: No    OB History    Grav Para Term Preterm Abortions TAB SAB Ect Mult Living   1         1      Review of Systems  All other systems reviewed and are negative.    Allergies  Sulfa antibiotics and Tramadol  Home Medications   Current Outpatient Rx  Name Route Sig Dispense Refill  . CITALOPRAM HYDROBROMIDE 20 MG PO TABS Oral Take 20 mg by mouth daily.      . IBUPROFEN 800 MG PO TABS Oral Take 800 mg by mouth every 8 (eight) hours as needed. Menstrual pain     . LAMOTRIGINE 25 MG PO TABS Oral Take 50 mg by mouth every morning.        BP 122/80  Pulse 87  Temp(Src) 98.1 F (36.7 C) (Oral)  Resp 16  SpO2 97%  Physical Exam  Nursing note and vitals reviewed. Constitutional: She appears well-developed and well-nourished. No distress.  HENT:  Head: Normocephalic and atraumatic.  Right Ear: External ear normal.  Left Ear: External ear  normal.  Eyes: Conjunctivae are normal. Right eye exhibits no discharge. Left eye exhibits no discharge. No scleral icterus.  Neck: Neck supple. No tracheal deviation present.  Cardiovascular: Normal rate, regular rhythm and intact distal pulses.   Pulmonary/Chest: Effort normal and breath sounds normal. No stridor. No respiratory distress. She has no wheezes. She has no rales.  Abdominal: Soft. Bowel sounds are normal. She exhibits no distension. There is no tenderness. There is CVA tenderness (right-sided). There is no rebound and no guarding.  Musculoskeletal: She exhibits no edema and no tenderness.  Neurological: She is alert. She has normal strength. No sensory deficit. Cranial nerve deficit:  no gross defecits noted. She exhibits normal muscle tone. She displays no seizure activity. Coordination normal.  Skin: Skin is warm and dry. No rash noted.  Psychiatric: She has a normal mood and affect.    ED Course  Procedures (including critical care time)  Labs Reviewed  URINALYSIS, ROUTINE W REFLEX MICROSCOPIC - Abnormal; Notable for the following:    APPearance TURBID (*)    Hgb urine dipstick TRACE (*)    Protein, ur 30 (*)    Nitrite POSITIVE (*)    Leukocytes,  UA LARGE (*)    All other components within normal limits  URINE MICROSCOPIC-ADD ON - Abnormal; Notable for the following:    Squamous Epithelial / LPF MANY (*)    Bacteria, UA FEW (*)    All other components within normal limits  CBC  POCT I-STAT, CHEM 8   No results found.       MDM  The patient has had a urinary tract infection. I suspect her flank pain is associated with early signs of an upper urinary tract infection. She is not febrile and does not have any vomiting. I doubt acute pyelonephritis at this time. Because of the flank pain I will extend her course of antibiotics beyond the treatment for a simple UTI        Celene Kras, MD 02/26/12 2241

## 2012-02-26 NOTE — ED Notes (Signed)
Pt states since Sunday she has had pain from head to toe, states abdominal pain, lower back pain, extremity pain (states unable to walk due to discomfort) reports no relief from pain with OTC. Denies n/v/d.

## 2012-02-26 NOTE — ED Notes (Signed)
Pt stated that she has been having right lower back pain starting on Sunday. Pain begin to radiate to right lower stomach. Per pt, pain is a dull aching pain. Pt also complaining of increased in frequency of urination since Sunday. She stated that she has not had urgency or pain with urination. Will continue to monitor.

## 2012-02-26 NOTE — ED Notes (Signed)
Pt is ambulatory without any difficulty in triage.

## 2012-03-16 ENCOUNTER — Encounter (HOSPITAL_COMMUNITY): Payer: Self-pay | Admitting: Emergency Medicine

## 2012-03-16 ENCOUNTER — Emergency Department (HOSPITAL_COMMUNITY)
Admission: EM | Admit: 2012-03-16 | Discharge: 2012-03-16 | Disposition: A | Discharge status: Home / Self Care | Payer: Medicaid Other | Attending: Emergency Medicine | Admitting: Emergency Medicine

## 2012-03-16 ENCOUNTER — Emergency Department (HOSPITAL_COMMUNITY): Payer: Medicaid Other

## 2012-03-16 DIAGNOSIS — R0602 Shortness of breath: Secondary | ICD-10-CM | POA: Insufficient documentation

## 2012-03-16 DIAGNOSIS — B9789 Other viral agents as the cause of diseases classified elsewhere: Secondary | ICD-10-CM | POA: Insufficient documentation

## 2012-03-16 DIAGNOSIS — R059 Cough, unspecified: Secondary | ICD-10-CM | POA: Insufficient documentation

## 2012-03-16 DIAGNOSIS — B349 Viral infection, unspecified: Secondary | ICD-10-CM

## 2012-03-16 DIAGNOSIS — R079 Chest pain, unspecified: Secondary | ICD-10-CM | POA: Insufficient documentation

## 2012-03-16 DIAGNOSIS — R05 Cough: Secondary | ICD-10-CM | POA: Insufficient documentation

## 2012-03-16 DIAGNOSIS — R109 Unspecified abdominal pain: Secondary | ICD-10-CM | POA: Insufficient documentation

## 2012-03-16 LAB — BASIC METABOLIC PANEL
BUN: 11 mg/dL (ref 6–23)
CO2: 24 mEq/L (ref 19–32)
Calcium: 8.9 mg/dL (ref 8.4–10.5)
Chloride: 103 mEq/L (ref 96–112)
Creatinine, Ser: 0.78 mg/dL (ref 0.50–1.10)
GFR calc Af Amer: >90 mL/min (ref >90)
GFR calc non Af Amer: >90 mL/min (ref >90)
Glucose, Bld: 98 mg/dL (ref 70–99)
Potassium: 4.1 mEq/L (ref 3.5–5.1)
Sodium: 138 mEq/L (ref 135–145)

## 2012-03-16 LAB — CBC
HCT: 39.6 % (ref 36.0–46.0)
Hemoglobin: 13.7 g/dL (ref 12.0–15.0)
MCH: 32.2 pg (ref 26.0–34.0)
MCHC: 34.6 g/dL (ref 30.0–36.0)
MCV: 93 fL (ref 78.0–100.0)
Platelets: 218 10*3/uL (ref 150–400)
RBC: 4.26 MIL/uL (ref 3.87–5.11)
RDW: 15.3 % (ref 11.5–15.5)
WBC: 7.7 10*3/uL (ref 4.0–10.5)

## 2012-03-16 LAB — POCT I-STAT TROPONIN I: Troponin i, poc: 0.01 ng/mL (ref 0.00–0.08)

## 2012-03-16 LAB — DIFFERENTIAL
Basophils Absolute: 0 10*3/uL (ref 0.0–0.1)
Basophils Relative: 0 % (ref 0–1)
Eosinophils Absolute: 0 10*3/uL (ref 0.0–0.7)
Eosinophils Relative: 0 % (ref 0–5)
Lymphocytes Relative: 25 % (ref 12–46)
Lymphs Abs: 1.9 10*3/uL (ref 0.7–4.0)
Monocytes Absolute: 0.5 10*3/uL (ref 0.1–1.0)
Monocytes Relative: 7 % (ref 3–12)
Neutro Abs: 5.3 10*3/uL (ref 1.7–7.7)
Neutrophils Relative %: 68 % (ref 43–77)

## 2012-03-16 MED ORDER — FEXOFENADINE-PSEUDOEPHED ER 60-120 MG PO TB12: 1.0000 | ORAL_TABLET | Freq: Two times a day (BID) | ORAL | Status: DC

## 2012-03-16 NOTE — Discharge Instructions (Signed)
Antibiotic Nonuse  Your caregiver felt that the infection or problem was not one that would be helped with an antibiotic. Infections may be caused by viruses or bacteria. Only a caregiver can tell which one of these is the likely cause of an illness. A cold is the most common cause of infection in both adults and children. A cold is a virus. Antibiotic treatment will have no effect on a viral infection. Viruses can lead to many lost days of work caring for sick children and many missed days of school. Children may catch as many as 10 "colds" or "flus" per year during which they can be tearful, cranky, and uncomfortable. The goal of treating a virus is aimed at keeping the ill person comfortable. Antibiotics are medications used to help the body fight bacterial infections. There are relatively few types of bacteria that cause infections but there are hundreds of viruses. While both viruses and bacteria cause infection they are very different types of germs. A viral infection will typically go away by itself within 7 to 10 days. Bacterial infections may spread or get worse without antibiotic treatment. Examples of bacterial infections are:  Sore throats (like strep throat or tonsillitis).   Infection in the lung (pneumonia).   Ear and skin infections.  Examples of viral infections are:  Colds or flus.   Most coughs and bronchitis.   Sore throats not caused by Strep.   Runny noses.  It is often best not to take an antibiotic when a viral infection is the cause of the problem. Antibiotics can kill off the helpful bacteria that we have inside our body and allow harmful bacteria to start growing. Antibiotics can cause side effects such as allergies, nausea, and diarrhea without helping to improve the symptoms of the viral infection. Additionally, repeated uses of antibiotics can cause bacteria inside of our body to become resistant. That resistance can be passed onto harmful bacterial. The next time  you have an infection it may be harder to treat if antibiotics are used when they are not needed. Not treating with antibiotics allows our own immune system to develop and take care of infections more efficiently. Also, antibiotics will work better for [name] when they are prescribed for bacterial infections. Treatments for a child that is ill may include:  Give extra fluids throughout the day to stay hydrated.   Get plenty of rest.   Only give your child over-the-counter or prescription medicines for pain, discomfort, or fever as directed by your caregiver.   The use of a cool mist humidifier may help stuffy noses.   Cold medications if suggested by your caregiver.  Your caregiver may decide to start you on an antibiotic if:  The problem you were seen for today continues for a longer length of time than expected.   You develop a secondary bacterial infection.  SEEK MEDICAL CARE IF:  Fever lasts longer than 5 days.   Symptoms continue to get worse after 5 to 7 days or become severe.   Difficulty in breathing develops.   Signs of dehydration develop (poor drinking, rare urinating, dark colored urine).   Changes in behavior or worsening tiredness (listlessness or lethargy).  Document Released: 12/16/2001 Document Revised: 09/26/2011 Document Reviewed: 06/14/2009 [facility] Patient Information 2012 [place], [place].Viral Infections A virus is a type of germ. Viruses can cause:  Minor sore throats.   Aches and pains.   Headaches.   Runny nose.   Rashes.   Watery eyes.   Tiredness.  Coughs.   Loss of appetite.   Feeling sick to your stomach (nausea).   Throwing up (vomiting).   Watery poop (diarrhea).  HOME CARE   Only take medicines as told by your doctor.   Drink enough water and fluids to keep your pee (urine) clear or pale yellow. Sports drinks are a good choice.   Get plenty of rest and eat healthy. Soups and broths with crackers or rice are fine.  GET HELP  RIGHT AWAY IF:   You have a very bad headache.   You have shortness of breath.   You have chest pain or neck pain.   You have an unusual rash.   You cannot stop throwing up.   You have watery poop that does not stop.   You cannot keep fluids down.   You or your child has a temperature by mouth above 102 F (38.9 C), not controlled by medicine.   Your baby is older than 3 months with a rectal temperature of 102 F (38.9 C) or higher.   Your baby is 75 months old or younger with a rectal temperature of 100.4 F (38 C) or higher.  MAKE SURE YOU:   Understand these instructions.   Will watch this condition.   Will get help right away if you are not doing well or get worse.  Document Released: 09/19/2008 Document Revised: 09/26/2011 Document Reviewed: 02/12/2011 [facility] Patient Information 2012 [place], [place].  RESOURCE GUIDE  Dental Problems  Patients with Medicaid: [facility] [phone] [address]                                           [phone] [address] Phone:  [phone]                                                  Phone:  [phone]  If unable to pay or uninsured, contact:  [facility] or [facility]. to become qualified for the adult dental clinic.  Chronic Pain Problems Contact [facility]  [phone] Patients need to be referred by their primary care doctor.  Insufficient Money for Medicine Contact United Way:  call "211" or [facility] [phone].  No Primary Care Doctor Call [facility]  [phone] Other agencies that provide inexpensive medical care    [facility]  [phone]    [facility]  [phone]    [facility]  [phone]    [facility]  [phone]    [facility]anned [facility]  [phone]    [facility]  [phone]  Psychological Services [facility] Behavioral Health  [phone] [facility] Services   [phone] [facility] Mental Health   [phone] (emergency services [phone])  Substance Abuse Resources [facility]  [phone] [facility] [phone] [facility] [place] [phone] Floydene Flock [phone] Residential & Outpatient Substance Abuse Program  [phone]  Abuse/Neglect [facility] Child Abuse Hotline 562-861-7335 [facility] Child Abuse Hotline 984-281-0699 (After Hours)  Emergency Shelter [facility] (918)755-6949  [facility] [place] [phone] [employer] Services [phone]  MRSA Hotline #:   (289)340-1405    [facility] Resources  [facility]     [employer]                          [facility] Dept. [address] [place]                       [address]      [address]  Blondell Reveal Phone:  [phone]                                   Phone:  [phone]                 Phone:  [phone]  [facility] Mental Health Phone:  [phone]  [facility] Child Abuse Hotline [phone]) 235-2485 [phone] (After Hours)

## 2012-03-16 NOTE — ED Notes (Signed)
EDP at bedside  

## 2012-03-16 NOTE — ED Notes (Addendum)
Pt stated that she came to the ED because she has been having CP when coughing, intermittent SOB, and generalized abdominal pain for couple days. Pt stated that she has been having a non-productive cough and bilateral hearing deficit x 2 weeks. No radiation in chest pain or abdominal pain. Pt stated that pain increases with coughing an taking deep breaths.Pt stated that she also has been increased in urination, urgency, and vaginal itching. No respiratory or cardiac distress noted. Will continue to monitor.

## 2012-03-16 NOTE — ED Notes (Signed)
Patient complaining of multiple symptoms: cold symptoms, difficulty hearing bilaterally, non-productive cough, chest pain, and abdominal pain for the past two weeks.

## 2012-03-21 NOTE — ED Provider Notes (Signed)
History    46 year old female with chest pain. Pain is in the center of her chest when  coughing. Her cough is nonproductive. Has been going on for about 2 weeks. Progressively getting worse. No fevers or chills. Mild shortness of breath coughing. No unusual leg pain or swelling. Denies history of blood clot. No sick contacts.  CSN: [id number]  Arrival date & time 03/16/12  2033   First MD Initiated Contact with Patient 03/16/12 2129      Chief Complaint  Patient presents with  . Chest Pain  . Abdominal Pain    (Consider location/radiation/quality/duration/timing/severity/associated sxs/prior treatment) HPI  Past Medical History  Diagnosis Date  . Mental disorder   . Bipolar 1 disorder   . Bipolar 1 disorder   . Yeast infection of the vagina     Past Surgical History  Procedure Date  . No past surgeries     History reviewed. No pertinent family history.  History  Substance Use Topics  . Smoking status: Never Smoker   . Smokeless tobacco: Not on file  . Alcohol Use: No    OB History    Grav Para Term Preterm Abortions TAB SAB Ect Mult Living   1         1      Review of Systems   Review of symptoms negative unless otherwise noted in HPI.   Allergies  Sulfa antibiotics and Tramadol  Home Medications   Current Outpatient Rx  Name Route Sig Dispense Refill  . CITALOPRAM HYDROBROMIDE 20 MG PO TABS Oral Take 20 mg by mouth daily.      . IBUPROFEN 800 MG PO TABS Oral Take 800 mg by mouth every 8 (eight) hours as needed. Menstrual pain     . LAMOTRIGINE 25 MG PO TABS Oral Take 50 mg by mouth every morning.      Marland Kitchen FEXOFENADINE-PSEUDOEPHED ER 60-120 MG PO TB12 Oral Take 1 tablet by mouth every 12 (twelve) hours. 30 tablet 0    BP 97/79  Pulse 91  Temp(Src) 98.1 F (36.7 C) (Oral)  Resp 18  SpO2 96%  LMP 03/02/2012  Physical Exam  Nursing note and vitals reviewed. Constitutional: No distress.       Laying in bed. No acute distress. Obese.  HENT:    Head: Normocephalic and atraumatic.  Mouth/Throat: Oropharynx is clear and moist. No oropharyngeal exudate.  Eyes: Conjunctivae are normal. Right eye exhibits no discharge. Left eye exhibits no discharge. No scleral icterus.  Neck: Normal range of motion. Neck supple.  Cardiovascular: Normal rate, regular rhythm and normal heart sounds.  Exam reveals no gallop and no friction rub.   No murmur heard. Pulmonary/Chest: Effort normal and breath sounds normal. No respiratory distress.  Abdominal: Soft. She exhibits no distension. There is no tenderness.  Musculoskeletal: She exhibits no edema and no tenderness.  Lymphadenopathy:    She has no cervical adenopathy.  Neurological: She is alert.  Skin: Skin is warm. She is not diaphoretic.  Psychiatric: She has a normal mood and affect. Her behavior is normal. Thought content normal.    ED Course  Procedures (including critical care time)   Labs Reviewed  CBC  DIFFERENTIAL  BASIC METABOLIC PANEL  POCT I-STAT TROPONIN I  LAB REPORT - SCANNED   No results found.  Dg Chest 2 View  03/16/2012  *RADIOLOGY REPORT*  Clinical Data: Chest pain, shortness of breath, and cough for 2 weeks.  CHEST - 2 VIEW  Comparison: 04/11/2010  Findings:  Normal heart size and pulmonary vascularity.  No focal airspace consolidation in the lungs.  No blunting of costophrenic angles.  Mediastinal contours are intact.  No pneumothorax. Surgical clips in the right upper quadrant.  No significant change since previous study.  IMPRESSION: No evidence of active pulmonary disease.  Original Report Authenticated By: Marlon Pel, M.D.   EKG:  Rhythm: Sinus tachycardia Rate: 100 Axis: Normal Intervals: Normal ST segments: Normal   1. Viral illness       MDM  46 year old female with chest pain. Atypical for ACS. EKG with no concerning changes. Workup unremarkable. Doubt ACS. Consider infectious with cough and other URI symptoms. Suspect that she has a viral  illness. Doubt pneumonia. Chest x-ray is clear. Patient is afebrile and hemodynamically stable. Oxygen saturations were good on room air. Consider pulmonary embolism but doubt. Plan symptomatic treatment. Outpatient followup. Return precautions were discussed.        Raeford Razor, MD 03/21/12 [phone]

## 2012-05-02 ENCOUNTER — Encounter (HOSPITAL_COMMUNITY): Payer: Self-pay | Admitting: *Deleted

## 2012-05-02 ENCOUNTER — Emergency Department (HOSPITAL_COMMUNITY)
Admission: EM | Admit: 2012-05-02 | Discharge: 2012-05-02 | Disposition: A | Discharge status: Home / Self Care | Payer: Medicaid Other | Attending: Emergency Medicine | Admitting: Emergency Medicine

## 2012-05-02 ENCOUNTER — Emergency Department (HOSPITAL_COMMUNITY): Payer: Medicaid Other

## 2012-05-02 DIAGNOSIS — J40 Bronchitis, not specified as acute or chronic: Secondary | ICD-10-CM | POA: Insufficient documentation

## 2012-05-02 DIAGNOSIS — F319 Bipolar disorder, unspecified: Secondary | ICD-10-CM | POA: Insufficient documentation

## 2012-05-02 MED ORDER — ALBUTEROL SULFATE HFA 108 (90 BASE) MCG/ACT IN AERS
2.0000 | INHALATION_SPRAY | RESPIRATORY_TRACT | Status: DC | PRN
  Administered 2012-05-02: 2 via RESPIRATORY_TRACT
  Filled 2012-05-02: qty 6.7

## 2012-05-02 NOTE — ED Notes (Signed)
Pt reports having hoarseness, productive cough, sore throat x 3 months.

## 2012-05-02 NOTE — ED Provider Notes (Signed)
History  Scribed for Ethelda Chick, MD, the patient was seen in room TR08C/TR08C. This chart was scribed by Candelaria Stagers. The patient's care started at 3:00 PM   CSN: [id number]  Arrival date & time 05/02/12  1442   None     Chief Complaint  Patient presents with  . Sore Throat    The history is provided by the patient.   Jordan [name] is a 46 y.o. female who presents to the Emergency Department complaining of SOB that started about three months ago and has worsened.  She states that she is also experiencing sore throat, productive cough, and hoarseness.  She denies fever.  Pt has h/o bipolar.   Cough is productive of yellowish sputum.  There are no other associated systemic symptoms, She has had no fever/chills. She is a nonsmoker. No difficulty swallowing or breathing Past Medical History  Diagnosis Date  . Mental disorder   . Bipolar 1 disorder   . Bipolar 1 disorder   . Yeast infection of the vagina     Past Surgical History  Procedure Date  . No past surgeries     History reviewed. No pertinent family history.  History  Substance Use Topics  . Smoking status: Never Smoker   . Smokeless tobacco: Not on file  . Alcohol Use: No    OB History    Grav Para Term Preterm Abortions TAB SAB Ect Mult Living   1         1      Review of Systems  Constitutional: Negative for fever.  HENT: Positive for sore throat and voice change.   Respiratory: Positive for cough and shortness of breath.   All other systems reviewed and are negative.    Allergies  Sulfa antibiotics and Tramadol  Home Medications   Current Outpatient Rx  Name Route Sig Dispense Refill  . CITALOPRAM HYDROBROMIDE 20 MG PO TABS Oral Take 20 mg by mouth daily.      . IBUPROFEN 800 MG PO TABS Oral Take 800 mg by mouth every 8 (eight) hours as needed. Menstrual pain     . LAMOTRIGINE 25 MG PO TABS Oral Take 50 mg by mouth every morning.        BP 122/95  Pulse 104  Temp 98.5 F (36.9 C)  (Oral)  Resp 22  Ht 5\' 8"  (1.727 m)  Wt 300 lb (136.079 kg)  BMI 45.61 kg/m2  SpO2 95%  LMP 04/08/2012  Physical Exam  Nursing note and vitals reviewed. Constitutional: She is oriented to person, place, and time. She appears well-developed and well-nourished. No distress.  HENT:  Head: Normocephalic and atraumatic.       Throat clear.  Voice hoarse.  Pulmonary/Chest: Breath sounds normal. She has no wheezes. She has no rales.  Musculoskeletal: Normal range of motion.  Neurological: She is alert and oriented to person, place, and time.  Skin: Skin is warm and dry. She is not diaphoretic.  Psychiatric: She has a normal mood and affect. Her behavior is normal.   CV- RRR, no murmurs/rubs/gallops Extremities- no pedal edema, no cyanosis or clubbing Mouth- MMM, OP clear, no ertyhema or exudate  ED Course  Procedures  DIAGNOSTIC STUDIES: Oxygen Saturation is 95% on room air, normal by my interpretation.    COORDINATION OF CARE:     Labs Reviewed - No data to display Dg Chest 2 View  05/02/2012  *RADIOLOGY REPORT*  Clinical Data: Short of breath.  Chest pain.  Cough.  Sore throat.  CHEST - 2 VIEW  Comparison:  03/16/2012  Findings:  The heart size and mediastinal contours are within normal limits.  Both lungs are clear.  The visualized skeletal structures are unremarkable.  IMPRESSION: No active cardiopulmonary disease.  Original Report Authenticated By: Danae Orleans, M.D.     1. Bronchitis       MDM  Pt presenting with productive cough and sore throat with coughing- symptoms ongoing x 3 months.  C/w bronchitis.  Pt given albuterol inhaler and instructed on its use.  CXR reassuring.  Pt discharged with strict return precautions.  Upon discharge patient tearful and asking if she could be admitted to the hospital to be checked for menopause.  [name] that there is no indication for her to be admitted at this time and if symptoms worsened she would need  outpatient followup.  She verbally agreed to this plan.   I personally performed the services described in this documentation, which was scribed in my presence. The recorded information has been reviewed and considered.          Ethelda Chick, MD 05/02/12 [phone]

## 2012-05-02 NOTE — ED Notes (Signed)
Pt returns from xray

## 2012-05-30 ENCOUNTER — Inpatient Hospital Stay (HOSPITAL_COMMUNITY)
Admission: AD | Admit: 2012-05-30 | Discharge: 2012-05-30 | Disposition: A | Discharge status: Home / Self Care | Payer: Medicaid Other | Source: Ambulatory Visit | Attending: Obstetrics & Gynecology | Admitting: Obstetrics & Gynecology

## 2012-05-30 ENCOUNTER — Encounter (HOSPITAL_COMMUNITY): Payer: Self-pay | Admitting: *Deleted

## 2012-05-30 DIAGNOSIS — N925 Other specified irregular menstruation: Secondary | ICD-10-CM | POA: Insufficient documentation

## 2012-05-30 DIAGNOSIS — N938 Other specified abnormal uterine and vaginal bleeding: Secondary | ICD-10-CM

## 2012-05-30 DIAGNOSIS — N949 Unspecified condition associated with female genital organs and menstrual cycle: Secondary | ICD-10-CM | POA: Insufficient documentation

## 2012-05-30 DIAGNOSIS — F319 Bipolar disorder, unspecified: Secondary | ICD-10-CM | POA: Insufficient documentation

## 2012-05-30 NOTE — MAU Note (Signed)
LMP started in June and has not stopped. When to primary care doctor and they told her she was premenopausal. She been having bleeding and breast tenderness. Has a appt with a gynecologist on 8/27. Patient states this has been going on too long and wanted a second opinion.

## 2012-05-30 NOTE — MAU Provider Note (Signed)
History     CSN: [id number]  Arrival date and time: 05/30/12 2004   First Provider Initiated Contact with Patient 05/30/12 2106      Chief Complaint  Patient presents with  . Vaginal Bleeding   HPI This is a 46 y.o. female who presents via EMS with c/o heavy vaginal bleeding since her period started in June. States fills maxipads in 1-2 hrs.  No cramping. Never had irregular periods in her whole life before this. No c/o dizziness or malaise. Saw her family doctor and he told her it was probably "premenstrual premenopause".  States her periods are the only thing that make her feel like a woman and the thought of them stopping sends her into a panic.  She does not want to go into menopause. She wants a second opinion and a blood test to "know for sure".  She thinks this means her menopause is starting soon.  She has an appt with an OB/GYN later this month, but does not want to wait that long to find out. States has had some breast tenderness. Told RN she thought this might be a miscarriage, but tells me it is impossible to be pregnant, because her partner had a vasectomy.  Last baby was 16 years ago. Has never had an abnormal period.  OB History    Grav Para Term Preterm Abortions TAB SAB Ect Mult Living   1 1 1       1       Past Medical History  Diagnosis Date  . Mental disorder   . Bipolar 1 disorder   . Bipolar 1 disorder   . Yeast infection of the vagina   . Bipolar disorder     Past Surgical History  Procedure Date  . No past surgeries     History reviewed. No pertinent family history.  History  Substance Use Topics  . Smoking status: Never Smoker   . Smokeless tobacco: Not on file  . Alcohol Use: No    Allergies:  Allergies  Allergen Reactions  . Sulfa Antibiotics Swelling  . Tramadol Anaphylaxis    Prescriptions prior to admission  Medication Sig Dispense Refill  . citalopram (CELEXA) 20 MG tablet Take 20 mg by mouth daily.        Marland Kitchen lamoTRIgine  (LAMICTAL) 25 MG tablet Take 50 mg by mouth every morning.          ROS As in HPI  Physical Exam   Blood pressure 147/93, pulse 104, temperature 98.6 F (37 C), temperature source Oral, resp. rate 18, height 5\' 8"  (1.727 m), weight 350 lb (158.759 kg), last menstrual period 04/08/2012.  Physical Exam  Constitutional: She is oriented to person, place, and time. She appears well-developed and well-nourished. She appears distressed (upset over menopause).  Cardiovascular: Normal rate, regular rhythm and normal heart sounds.   Respiratory: Effort normal and breath sounds normal. No respiratory distress. She has no wheezes. She has no rales.  GI: Soft. She exhibits no distension and no mass. There is no tenderness. There is no rebound and no guarding.  Genitourinary:       Refuses pelvic exam  Musculoskeletal: Normal range of motion.  Neurological: She is alert and oriented to person, place, and time.       Rushed speech  Skin: Skin is warm and dry.  Psychiatric: She has a normal mood and affect.    MAU Course  Procedures  MDM Discussed perimenopause and menopause in great detail.  This required  several repeated explanations.  Offered full workup including CBC, pelvic exam and [name].  Initially wanted [name] and CBC.  Then wanted only CBC.  Then told nurse she just wanted to go home with no workup.  Discussed treatments for heavy bleeding and she refuses. She states she in no way wants to stop the bleeding.   Assessment and Plan  A:  Dysfunctional Uterine Bleeding      Probably related to anovulation or PCOS.     Morbid obesity      Bipolar       P:  Offered evaluation and treatment      Refuses exam and evaluation/testing      Discharged home.  Wynelle Bourgeois 05/30/2012, 9:30 PM

## 2012-05-31 NOTE — MAU Provider Note (Signed)
Medical Screening exam and patient care preformed by advanced practice provider.  Agree with the above management.  

## 2012-06-12 ENCOUNTER — Encounter: Payer: Medicaid Other | Admitting: Family Medicine

## 2012-06-20 ENCOUNTER — Encounter (HOSPITAL_COMMUNITY): Payer: Self-pay | Admitting: *Deleted

## 2012-06-20 ENCOUNTER — Emergency Department (HOSPITAL_COMMUNITY)
Admission: EM | Admit: 2012-06-20 | Discharge: 2012-06-21 | Disposition: A | Discharge status: Home / Self Care | Payer: Medicaid Other | Attending: Emergency Medicine | Admitting: Emergency Medicine

## 2012-06-20 DIAGNOSIS — N390 Urinary tract infection, site not specified: Secondary | ICD-10-CM | POA: Insufficient documentation

## 2012-06-20 DIAGNOSIS — Z882 Allergy status to sulfonamides status: Secondary | ICD-10-CM | POA: Insufficient documentation

## 2012-06-20 DIAGNOSIS — Z888 Allergy status to other drugs, medicaments and biological substances status: Secondary | ICD-10-CM | POA: Insufficient documentation

## 2012-06-20 DIAGNOSIS — F319 Bipolar disorder, unspecified: Secondary | ICD-10-CM | POA: Insufficient documentation

## 2012-06-20 LAB — WET PREP, GENITAL
Trich, Wet Prep: NONE SEEN
Yeast Wet Prep HPF POC: NONE SEEN

## 2012-06-20 LAB — URINALYSIS, ROUTINE W REFLEX MICROSCOPIC
Bilirubin Urine: NEGATIVE
Glucose, UA: NEGATIVE mg/dL
Ketones, ur: NEGATIVE mg/dL
Nitrite: POSITIVE — AB
Protein, ur: NEGATIVE mg/dL
Specific Gravity, Urine: 1.024 (ref 1.005–1.030)
Urobilinogen, UA: 1 mg/dL (ref 0.0–1.0)
pH: 5.5 (ref 5.0–8.0)

## 2012-06-20 LAB — URINE MICROSCOPIC-ADD ON

## 2012-06-20 LAB — POCT PREGNANCY, URINE: Preg Test, Ur: NEGATIVE

## 2012-06-20 MED ORDER — CIPROFLOXACIN HCL 500 MG PO TABS
500.0000 mg | ORAL_TABLET | Freq: Once | ORAL | Status: AC
  Administered 2012-06-21: 500 mg via ORAL
  Filled 2012-06-20: qty 1

## 2012-06-20 NOTE — ED Provider Notes (Signed)
History     CSN: [id number]  Arrival date & time 06/20/12  1432   First MD Initiated Contact with Patient 06/20/12 2239      Chief Complaint  Patient presents with  . Back Pain  . Vaginal Discharge    dark dirty brown color with odor    (Consider location/radiation/quality/duration/timing/severity/associated sxs/prior treatment) Patient is a 46 y.o. female presenting with back pain and vaginal discharge. The history is provided by the patient. No language interpreter was used.  Back Pain  This is a new problem. The current episode started yesterday. The problem occurs daily. The problem has been gradually worsening. The pain is associated with no known injury. The quality of the pain is described as aching and burning. The pain is at a severity of 4/10. The pain is mild. Associated symptoms include dysuria. Pertinent negatives include no fever, no abdominal pain, no bowel incontinence, no bladder incontinence and no pelvic pain. She has tried nothing for the symptoms. Risk factors include obesity.  Vaginal Discharge Associated symptoms include nausea. Pertinent negatives include no abdominal pain, fever, rash, swollen glands or vomiting. Nothing aggravates the symptoms. She has tried nothing for the symptoms.    Past Medical History  Diagnosis Date  . Mental disorder   . Bipolar 1 disorder   . Bipolar 1 disorder   . Yeast infection of the vagina   . Bipolar disorder     Past Surgical History  Procedure Date  . No past surgeries     No family history on file.  History  Substance Use Topics  . Smoking status: Never Smoker   . Smokeless tobacco: Not on file  . Alcohol Use: No    OB History    Grav Para Term Preterm Abortions TAB SAB Ect Mult Living   1 1 1       1       Review of Systems  Constitutional: Negative.  Negative for fever.  HENT: Negative.   Eyes: Negative.   Respiratory: Negative.   Cardiovascular: Negative.   Gastrointestinal: Positive for nausea.  Negative for vomiting, abdominal pain and bowel incontinence.  Genitourinary: Positive for dysuria, urgency, frequency and vaginal discharge. Negative for bladder incontinence, hematuria, flank pain, decreased urine volume, difficulty urinating and pelvic pain.  Musculoskeletal: Positive for back pain.  Skin: Negative for rash.  Neurological: Negative.   Psychiatric/Behavioral: Negative.   All other systems reviewed and are negative.    Allergies  Sulfa antibiotics and Tramadol  Home Medications   Current Outpatient Rx  Name Route Sig Dispense Refill  . CITALOPRAM HYDROBROMIDE 20 MG PO TABS Oral Take 20 mg by mouth daily.      . IBUPROFEN 800 MG PO TABS Oral Take 800 mg by mouth every 8 (eight) hours as needed. For pain    . LAMOTRIGINE 25 MG PO TABS Oral Take 50 mg by mouth every morning.        BP 142/91  Pulse 91  Temp 97.7 F (36.5 C) (Oral)  Resp 16  SpO2 100%  Physical Exam  Nursing note and vitals reviewed. Constitutional: She is oriented to person, place, and time. She appears well-developed and well-nourished.  HENT:  Head: Normocephalic and atraumatic.  Eyes: Conjunctivae and EOM are normal. Pupils are equal, round, and reactive to light.  Neck: Normal range of motion. Neck supple.  Cardiovascular: Normal rate.   Pulmonary/Chest: Effort normal and breath sounds normal. No respiratory distress.  Abdominal: Soft.  Genitourinary: There is breast swelling,  tenderness and discharge. No breast bleeding. There is tenderness on the right labia. There is no rash, lesion or injury on the right labia. There is tenderness on the left labia. There is no rash, lesion or injury on the left labia. There is erythema and tenderness around the vagina. No bleeding around the vagina. No foreign body around the vagina. No signs of injury around the vagina. Vaginal discharge found.  Musculoskeletal: Normal range of motion. She exhibits no edema and no tenderness.  Neurological: She is  alert and oriented to person, place, and time. She has normal reflexes.  Skin: Skin is warm and dry.  Psychiatric: She has a normal mood and affect.    ED Course  Pelvic exam Date/Time: 06/20/2012 11:44 PM Performed by: Remi Haggard Authorized by: Remi Haggard Consent: Verbal consent obtained. Risks and benefits: risks, benefits and alternatives were discussed Consent given by: patient Patient understanding: patient states understanding of the procedure being performed Patient identity confirmed: verbally with patient, arm band and provided demographic data Time out: Immediately prior to procedure a "time out" was called to verify the correct patient, procedure, equipment, support staff and site/side marked as required. Preparation: Patient was prepped and draped in the usual sterile fashion. Patient tolerance: Patient tolerated the procedure well with no immediate complications.  Pelvic exam Date/Time: 06/20/2012 11:39 AM Performed by: Remi Haggard Authorized by: Remi Haggard Consent: Verbal consent obtained. Written consent not obtained. Risks and benefits: risks, benefits and alternatives were discussed Consent given by: patient Patient understanding: patient states understanding of the procedure being performed Patient identity confirmed: verbally with patient, arm band, provided demographic data and hospital-assigned identification number Time out: Immediately prior to procedure a "time out" was called to verify the correct patient, procedure, equipment, support staff and site/side marked as required. Patient tolerance: Patient tolerated the procedure well with no immediate complications.   (including critical care time)  Labs Reviewed  URINALYSIS, ROUTINE W REFLEX MICROSCOPIC - Abnormal; Notable for the following:    APPearance CLOUDY (*)     Hgb urine dipstick MODERATE (*)     Nitrite POSITIVE (*)     Leukocytes, UA LARGE (*)     All other components within normal  limits  URINE MICROSCOPIC-ADD ON - Abnormal; Notable for the following:    Squamous Epithelial / LPF MANY (*)     Bacteria, UA MANY (*)     All other components within normal limits  POCT PREGNANCY, URINE  GC/CHLAMYDIA PROBE AMP, GENITAL  WET PREP, GENITAL   No results found.   No diagnosis found.    MDM  Dysuria and vaginal discharge with itching x 2 days.  +UTI, + Bacterial Vaginosis.  Cipro started in ER.  No fever, nausea and vomiting.  Mild R cva tenderness.  She will return for severe pain n/v or high fever.  Rx for cipro and flagyl. Follow up with gyn or pcp of choice.    Labs Reviewed  URINALYSIS, ROUTINE W REFLEX MICROSCOPIC - Abnormal; Notable for the following:    APPearance CLOUDY (*)     Hgb urine dipstick MODERATE (*)     Nitrite POSITIVE (*)     Leukocytes, UA LARGE (*)     All other components within normal limits  URINE MICROSCOPIC-ADD ON - Abnormal; Notable for the following:    Squamous Epithelial / LPF MANY (*)     Bacteria, UA MANY (*)     All other components within normal limits  WET PREP, GENITAL -  Abnormal; Notable for the following:    Clue Cells Wet Prep HPF POC FEW (*)     WBC, Wet Prep HPF POC FEW (*)     All other components within normal limits  POCT PREGNANCY, URINE  GC/CHLAMYDIA PROBE AMP, GENITAL          Remi Haggard, NP 06/22/12 1142

## 2012-06-20 NOTE — ED Notes (Signed)
Pt is here with mid back pain.  Pt has inner thigh burning itching between legs.  Pt has vaginal odor and dark brown vaginal discharge.  LMP-  Mid month

## 2012-06-21 MED ORDER — CIPROFLOXACIN HCL 500 MG PO TABS: 500.0000 mg | ORAL_TABLET | Freq: Two times a day (BID) | ORAL | Status: AC

## 2012-06-21 MED ORDER — IBUPROFEN 800 MG PO TABS: 600.0000 mg | ORAL_TABLET | Freq: Three times a day (TID) | ORAL | Status: AC

## 2012-06-21 MED ORDER — ONDANSETRON HCL 4 MG PO TABS: 4.0000 mg | ORAL_TABLET | Freq: Four times a day (QID) | ORAL | Status: AC

## 2012-06-21 MED ORDER — METRONIDAZOLE 500 MG PO TABS: 500.0000 mg | ORAL_TABLET | Freq: Two times a day (BID) | ORAL | Status: AC

## 2012-06-21 NOTE — ED Notes (Signed)
Patient was explained discharge instructions and she had no questions.  Patient is not in pain and is alert and oriented x4. Patient has family coming to get her to transport her home.

## 2012-06-23 LAB — GC/CHLAMYDIA PROBE AMP, GENITAL
Chlamydia, DNA Probe: NEGATIVE
GC Probe Amp, Genital: NEGATIVE

## 2012-06-29 ENCOUNTER — Ambulatory Visit (INDEPENDENT_AMBULATORY_CARE_PROVIDER_SITE_OTHER): Payer: Medicaid Other | Admitting: Family Medicine

## 2012-06-29 ENCOUNTER — Other Ambulatory Visit (HOSPITAL_COMMUNITY)
Admission: RE | Admit: 2012-06-29 | Discharge: 2012-06-29 | Disposition: A | Discharge status: Home / Self Care | Payer: Medicaid Other | Source: Ambulatory Visit | Attending: Obstetrics and Gynecology | Admitting: Obstetrics and Gynecology

## 2012-06-29 ENCOUNTER — Encounter: Payer: Self-pay | Admitting: Family Medicine

## 2012-06-29 VITALS — BP 147/93 | HR 95 | Temp 97.4°F | Ht 68.0 in | Wt 354.6 lb

## 2012-06-29 DIAGNOSIS — R87810 Cervical high risk human papillomavirus (HPV) DNA test positive: Secondary | ICD-10-CM | POA: Insufficient documentation

## 2012-06-29 DIAGNOSIS — Z01419 Encounter for gynecological examination (general) (routine) without abnormal findings: Secondary | ICD-10-CM | POA: Insufficient documentation

## 2012-06-29 DIAGNOSIS — F319 Bipolar disorder, unspecified: Secondary | ICD-10-CM

## 2012-06-29 DIAGNOSIS — N949 Unspecified condition associated with female genital organs and menstrual cycle: Secondary | ICD-10-CM

## 2012-06-29 DIAGNOSIS — N898 Other specified noninflammatory disorders of vagina: Secondary | ICD-10-CM

## 2012-06-29 DIAGNOSIS — N920 Excessive and frequent menstruation with regular cycle: Secondary | ICD-10-CM

## 2012-06-29 DIAGNOSIS — Z01812 Encounter for preprocedural laboratory examination: Secondary | ICD-10-CM

## 2012-06-29 LAB — CBC
HCT: 37.1 % (ref 36.0–46.0)
Hemoglobin: 12.7 g/dL (ref 12.0–15.0)
MCH: 31.6 pg (ref 26.0–34.0)
MCHC: 34.2 g/dL (ref 30.0–36.0)
MCV: 92.3 fL (ref 78.0–100.0)
Platelets: 238 10*3/uL (ref 150–400)
RBC: 4.02 MIL/uL (ref 3.87–5.11)
RDW: 15.4 % (ref 11.5–15.5)
WBC: 6.4 10*3/uL (ref 4.0–10.5)

## 2012-06-29 LAB — POCT PREGNANCY, URINE: Preg Test, Ur: NEGATIVE

## 2012-06-29 NOTE — Progress Notes (Signed)
Subjective:    Patient ID: Jordan [name], female    DOB: [birth year], 46 y.o.   MRN: [id number]  HPI Pt is a 46 y.o. G1P1001 (only child born 16 yrs ago) with recent change in menses. Until July, regular periods lasting 4 to 7 days, heavy. Mid-July, period started and continued with heavy daily bleeding until Mid-August without a break. Pt states she was soaking a pad several times a day, changing every time she voided. Last period before that was mid-June and normal. She is no longer spotting or bleeding. She does not use birth control and hasn't for over 16 years. No history of abnormal paps that she remembers. Does not recall when her last pap smear was. Was recently treated for UTI and BV and states vaginal odor is back after finishing metronidazole.   Gets her medical care from [facility].   PMH:  Bipolar disorder PSH:  No surgeries. Fam Hx: Dad - cancer - 32s. No DM, HTN, CAD or other cancers. Soc Hx: Never smoked, no alcohol or other drugs.   Review of Systems  Constitutional: Negative for fever, chills, activity change, appetite change and fatigue.  Respiratory: Negative for cough, chest tightness, shortness of breath and wheezing.   Cardiovascular: Negative for chest pain.  Gastrointestinal: Negative for abdominal distention.  Genitourinary: Positive for menstrual problem. Negative for dysuria, urgency, vaginal discharge (odor), difficulty urinating and pelvic pain.  Neurological: Negative for dizziness.       Objective:   Physical Exam  Constitutional: She is oriented to person, place, and time. She appears well-developed and well-nourished. No distress.       Obese  HENT:  Head: Normocephalic and atraumatic.  Eyes: Conjunctivae and EOM are normal.  Neck: Normal range of motion.  Cardiovascular: Normal rate and regular rhythm.   Pulmonary/Chest: Effort normal. No respiratory distress.  Abdominal: She exhibits no distension and no mass. There is no tenderness. There is  no rebound and no guarding.  Genitourinary:       Normal vagina. Minimal watery discharge. Normal cervix, no evident lesions. Uterus normal size, non-fixed, non-tender, no palpable masses. No CMT.  No adnexal tenderness/masses.  Musculoskeletal: She exhibits no edema and no tenderness.  Neurological: She is alert and oriented to person, place, and time.  Skin: Skin is warm and dry.  Psychiatric: She has a normal mood and affect.   PROCEDURES Pap smear and wet prep obtained.  Endometrial Biopsy - Attempted Patient given informed consent, signed copy in the chart, time out was performed. Appropriate time out taken. The patient was placed in the lithotomy position and the cervix brought into view with sterile speculum. Wet prep and Pap smear obtained first. Portio of cervix cleansed x 2 with betadine swabs.  A tenaculum was placed in the anterior lip of the cervix.  The uterus was sounded for depth of 6.5 cm. Attempt to introduce pipelle was unsuccessful after several attempts. Uterus was very anteflexed/anteverted and angle of cervix plus redundant vaginal tissue and moderate bleeding made exam very difficult. Due to patient discomfort, further attempts were discontinued. Tenaculum was removed. Minimal bleeding noted from tenaculum sites. All equipment was removed and accounted for.  The patient received 800 mg of ibuprofen following the procedure.  Patient given post procedure instructions.     Assessment & Plan:  46 y.o. G1P1001 with recent menorrhagia. Since EMB unsuccessful, will get pelvic/transvaginal sono, [facility]. Follow up pap smear Pt to keep menstrual log F/U in 2 weeks for results and  make decision at that time regarding need for endometrial tissue.

## 2012-06-29 NOTE — Patient Instructions (Signed)
You may have bleeding and cramping following your procedure. Take ibuprofen (motrin) for cramping - no more than 2400 mg in 24 hours). If your bleeding is more than a period, please call the clinic.   Perimenopause Perimenopause is the time when your body begins to move into the menopause (no menstrual period for 12 straight months). It is a natural process. Perimenopause can begin 2 to 8 years before the menopause and usually lasts for one year after the menopause. During this time, your ovaries may or may not produce an egg. The ovaries vary in their production of estrogen and progesterone hormones each month. This can cause irregular menstrual periods, difficulty in getting pregnant, vaginal bleeding between periods and uncomfortable symptoms. CAUSES  Irregular production of the ovarian hormones, estrogen and progesterone, and not ovulating every month.   Other causes include:   Tumor of the pituitary gland in the brain.   Medical disease that affects the ovaries.   Radiation treatment.   Chemotherapy.   Unknown causes.   Heavy smoking and excessive alcohol intake can bring on perimenopause sooner.  SYMPTOMS   Hot flashes.   Night sweats.   Irregular menstrual periods.   Decrease sex drive.   Vaginal dryness.   Headaches.   Mood swings.   Depression.   Memory problems.   Irritability.   Tiredness.   Weight gain.   Trouble getting pregnant.   The beginning of losing bone cells (osteoporosis).   The beginning of hardening of the arteries (atherosclerosis).  DIAGNOSIS  Your caregiver will make a diagnosis by analyzing your age, menstrual history and your symptoms. They will do a physical exam noting any changes in your body, especially your female organs. Female hormone tests may or may not be helpful depending on the amount and when you produce the female hormones. However, other hormone tests may be helpful (ex. thyroid hormone) to rule out other  problems. TREATMENT  The decision to treat during the perimenopause should be made by you and your caregiver depending on how the symptoms are affecting you and your life style. There are various treatments available such as:  Treating individual symptoms with a specific medication for that symptom (ex. tranquilizer for depression).   Herbal medications that can help specific symptoms.   Counseling.   Group therapy.   No treatment.  HOME CARE INSTRUCTIONS   Before seeing your caregiver, make a list of your menstrual periods (when the occur, how heavy they are, how long between periods and how long they last), your symptoms and when they started.   Take the medication as recommended by your caregiver.   Sleep and rest.   Exercise.   Eat a diet that contains calcium (good for your bones) and soy (acts like estrogen hormone).   Do not smoke.   Avoid alcoholic beverages.   Taking vitamin E may help in certain cases.   Take calcium and vitamin D supplements to help prevent bone loss.   Group therapy is sometimes helpful.   Acupuncture may help in some cases.  SEEK MEDICAL CARE IF:   You have any of the above and want to know if it is perimenopause.   You want advice and treatment for any of your symptoms mentioned above.   You need a referral to a specialist (gynecologist, psychiatrist or psychologist).  SEEK IMMEDIATE MEDICAL CARE IF:   You have vaginal bleeding.   Your period lasts longer than 8 days.   You periods are recurring sooner than  21 days.   You have bleeding after intercourse.   You have severe depression.   You have pain when you urinate.   You have severe headaches.   You develop vision problems.  Document Released: 11/14/2004 Document Revised: 09/26/2011 Document Reviewed: 08/04/2008 [facility] Patient Information 2012 [place], [place].

## 2012-06-30 LAB — WET PREP, GENITAL
Clue Cells Wet Prep HPF POC: NONE SEEN
Trich, Wet Prep: NONE SEEN
WBC, Wet Prep HPF POC: NONE SEEN
Yeast Wet Prep HPF POC: NONE SEEN

## 2012-06-30 LAB — TSH: TSH: 2.012 u[IU]/mL (ref 0.350–4.500)

## 2012-06-30 LAB — FOLLICLE STIMULATING HORMONE: FSH: 24.9 m[IU]/mL

## 2012-07-03 ENCOUNTER — Telehealth: Payer: Self-pay | Admitting: *Deleted

## 2012-07-03 NOTE — Telephone Encounter (Signed)
Message copied by Jordan [name] on Fri Jul 03, 2012 10:29 AM ------      Message from: FERRY, [place]      Created: Fri Jul 03, 2012  1:23 AM       Pt needs to be scheduled for colposcopy. I would like to schedule this with me if possible.      Truitt Merle, MD

## 2012-07-06 ENCOUNTER — Ambulatory Visit (HOSPITAL_COMMUNITY)
Admission: RE | Admit: 2012-07-06 | Discharge: 2012-07-06 | Disposition: A | Discharge status: Home / Self Care | Payer: Medicaid Other | Source: Ambulatory Visit | Attending: Obstetrics and Gynecology | Admitting: Obstetrics and Gynecology

## 2012-07-06 DIAGNOSIS — E669 Obesity, unspecified: Secondary | ICD-10-CM | POA: Insufficient documentation

## 2012-07-06 DIAGNOSIS — N920 Excessive and frequent menstruation with regular cycle: Secondary | ICD-10-CM

## 2012-07-08 NOTE — Telephone Encounter (Signed)
Called pt and was unable to leave message due to "VM not set up yet".

## 2012-07-09 NOTE — Telephone Encounter (Signed)
Pt informed of her colpo appt. Procedure explained to patient and she voiced no further questions.

## 2012-07-17 ENCOUNTER — Telehealth: Payer: Self-pay | Admitting: Medical

## 2012-07-17 NOTE — Telephone Encounter (Signed)
Patient called and spoke with Erie Noe stating that she does not have a phone number for the nurse to return her call. She was transferred to me to discuss the results of her recent labs and the need for colposcopy at her next visit. I explained to the patient that all of her blood work was normal. The only abnormal test that she had was her pap smear. I explained the level of dysplasia on her pap and what would be done with the colposcopy next week. The patient asked me to explain the pap smear results to her boyfriend and gave permission for me to speak with him. I explained the pap smear results and the need for colposcopy. Both the patient and her boyfriend voiced understanding and had no further questions at this time.

## 2012-07-22 ENCOUNTER — Encounter: Payer: Medicaid Other | Admitting: Obstetrics & Gynecology

## 2012-07-22 ENCOUNTER — Other Ambulatory Visit (HOSPITAL_COMMUNITY)
Admission: RE | Admit: 2012-07-22 | Discharge: 2012-07-22 | Disposition: A | Discharge status: Home / Self Care | Payer: Medicaid Other | Source: Ambulatory Visit | Attending: Obstetrics & Gynecology | Admitting: Obstetrics & Gynecology

## 2012-07-22 ENCOUNTER — Encounter: Payer: Self-pay | Admitting: Obstetrics & Gynecology

## 2012-07-22 ENCOUNTER — Ambulatory Visit: Payer: Medicaid Other | Admitting: Family Medicine

## 2012-07-22 ENCOUNTER — Ambulatory Visit (INDEPENDENT_AMBULATORY_CARE_PROVIDER_SITE_OTHER): Payer: Medicaid Other | Admitting: Obstetrics & Gynecology

## 2012-07-22 VITALS — BP 160/101 | HR 88 | Temp 97.1°F | Resp 12 | Ht 68.0 in | Wt 352.3 lb

## 2012-07-22 DIAGNOSIS — IMO0002 Reserved for concepts with insufficient information to code with codable children: Secondary | ICD-10-CM

## 2012-07-22 DIAGNOSIS — N879 Dysplasia of cervix uteri, unspecified: Secondary | ICD-10-CM | POA: Insufficient documentation

## 2012-07-22 DIAGNOSIS — R87612 Low grade squamous intraepithelial lesion on cytologic smear of cervix (LGSIL): Secondary | ICD-10-CM

## 2012-07-22 LAB — POCT PREGNANCY, URINE: Preg Test, Ur: NEGATIVE

## 2012-07-22 NOTE — Patient Instructions (Signed)
Colposcopy Care After Colposcopy is a procedure in which a special tool is used to magnify the surface of the cervix. A tissue sample (biopsy) may also be taken. This sample will be looked at for cervical cancer or other problems. After the test:  You may have some cramping.  Lie down for a few minutes if you feel lightheaded.   You may have some bleeding which should stop in a few days. HOME CARE  Do not have sex or use tampons for 2 to 3 days or as told.  Only take medicine as told by your doctor.  Continue to take your birth control pills as usual. Finding out the results of your test Ask when your test results will be ready. Make sure you get your test results. GET HELP RIGHT AWAY IF:  You are bleeding a lot or are passing blood clots.  You develop a fever of 102 F (38.9 C) or higher.  You have abnormal vaginal discharge.  You have cramps that do not go away with medicine.  You feel lightheaded, dizzy, or pass out (faint). MAKE SURE YOU:   Understand these instructions.  Will watch your condition.  Will get help right away if you are not doing well or get worse. Document Released: 03/25/2008 Document Revised: 12/30/2011 Document Reviewed: 03/25/2008 [employer] Patient Information 2013 [employer].  

## 2012-07-22 NOTE — Progress Notes (Signed)
LGSIL pap on 06/29/12, positive HRHPV  Patient given informed consent, signed copy in the chart, time out was performed.  Placed in lithotomy position. Cervix viewed with speculum and colposcope after application of acetic acid. It was difficult to visualize cervix because patient was uncooperative during the exam, and cervix was tilted posteriorly.  Had to place a tenaculum on the anterior lip to visualize entire cervix.   Colposcopy adequate? yes [place] lesions?No visible lesions, no mosaicism, no punctation and no abnormal vasculature ECC specimen obtained and sent to pathology.  Patient was given post procedure instructions.  Will follow up pathology and manage accordingly.

## 2012-07-27 ENCOUNTER — Telehealth: Payer: Self-pay

## 2012-07-27 ENCOUNTER — Encounter: Payer: Self-pay | Admitting: Obstetrics & Gynecology

## 2012-07-27 NOTE — Telephone Encounter (Signed)
Pt returned call and I informed her that her blood tests are normal and her [name] continues to show that she has cysts that pt is already aware of.  Pt asked about her dx of adenomyosis and if it is something that she will have to live with.  I informed pt that unless she comes to a resolution with a provider about the dx and/or if it gives her problems or it could possibly go away after menopause from reading then she will have that dx.  Pt stated understanding and had no further questions.

## 2012-07-27 NOTE — Telephone Encounter (Signed)
Pt called and wanted test results.  Called pt @ [phone] and was unable to leave message due "voicemail box not set up yet" Called [phone] and left message with "friend" to return call to the clinics.

## 2012-08-12 ENCOUNTER — Ambulatory Visit: Payer: Medicaid Other | Admitting: Obstetrics & Gynecology

## 2012-08-22 ENCOUNTER — Encounter (HOSPITAL_COMMUNITY): Payer: Self-pay | Admitting: Emergency Medicine

## 2012-08-22 ENCOUNTER — Emergency Department (HOSPITAL_COMMUNITY)
Admission: EM | Admit: 2012-08-22 | Discharge: 2012-08-22 | Disposition: A | Discharge status: Home / Self Care | Payer: Medicaid Other | Attending: Emergency Medicine | Admitting: Emergency Medicine

## 2012-08-22 DIAGNOSIS — F319 Bipolar disorder, unspecified: Secondary | ICD-10-CM | POA: Insufficient documentation

## 2012-08-22 DIAGNOSIS — N760 Acute vaginitis: Secondary | ICD-10-CM

## 2012-08-22 DIAGNOSIS — N390 Urinary tract infection, site not specified: Secondary | ICD-10-CM | POA: Insufficient documentation

## 2012-08-22 DIAGNOSIS — N898 Other specified noninflammatory disorders of vagina: Secondary | ICD-10-CM | POA: Insufficient documentation

## 2012-08-22 DIAGNOSIS — B9689 Other specified bacterial agents as the cause of diseases classified elsewhere: Secondary | ICD-10-CM

## 2012-08-22 LAB — URINALYSIS, ROUTINE W REFLEX MICROSCOPIC
Bilirubin Urine: NEGATIVE
Glucose, UA: NEGATIVE mg/dL
Ketones, ur: NEGATIVE mg/dL
Nitrite: NEGATIVE
Protein, ur: 30 mg/dL — AB
Specific Gravity, Urine: 1.024 (ref 1.005–1.030)
Urobilinogen, UA: 1 mg/dL (ref 0.0–1.0)
pH: 6 (ref 5.0–8.0)

## 2012-08-22 LAB — PREGNANCY, URINE: Preg Test, Ur: NEGATIVE

## 2012-08-22 LAB — URINE MICROSCOPIC-ADD ON

## 2012-08-22 LAB — WET PREP, GENITAL
Trich, Wet Prep: NONE SEEN
Yeast Wet Prep HPF POC: NONE SEEN

## 2012-08-22 MED ORDER — CIPROFLOXACIN HCL 500 MG PO TABS
500.0000 mg | ORAL_TABLET | Freq: Once | ORAL | Status: AC
  Administered 2012-08-22: 500 mg via ORAL
  Filled 2012-08-22: qty 1

## 2012-08-22 MED ORDER — METRONIDAZOLE 500 MG PO TABS: 500.0000 mg | ORAL_TABLET | Freq: Two times a day (BID) | ORAL | Status: DC

## 2012-08-22 MED ORDER — CIPROFLOXACIN HCL 500 MG PO TABS: 500.0000 mg | ORAL_TABLET | Freq: Two times a day (BID) | ORAL | Status: DC

## 2012-08-22 NOTE — ED Provider Notes (Signed)
History     CSN: [id number]  Arrival date & time 08/22/12  1321   First MD Initiated Contact with Patient 08/22/12 1443      Chief Complaint  Patient presents with  . Vaginal Itching  . Vaginal Odor     (Consider location/radiation/quality/duration/timing/severity/associated sxs/prior treatment) HPI Comments: Pt state that she is having burning with urination and some back pain:pt states that she always has back pain:pt states that she has white discharge with an odor:pt states that her last period was 2 months ago and she doctor told her that she is going thru menopause  Patient is a 46 y.o. female presenting with vaginal itching. The history is provided by the patient. No language interpreter was used.  Vaginal Itching This is a new problem. The current episode started 1 to 4 weeks ago. The problem occurs constantly. The problem has been unchanged. Pertinent negatives include no abdominal pain, fever, nausea or vomiting. Nothing aggravates the symptoms.    Past Medical History  Diagnosis Date  . Mental disorder   . Bipolar 1 disorder   . Bipolar 1 disorder   . Yeast infection of the vagina   . Bipolar disorder     Past Surgical History  Procedure Date  . No past surgeries     No family history on file.  History  Substance Use Topics  . Smoking status: Never Smoker   . Smokeless tobacco: Never Used  . Alcohol Use: No    OB History    Grav Para Term Preterm Abortions TAB SAB Ect Mult Living   1 1 1       1       Review of Systems  Constitutional: Negative for fever.  Respiratory: Negative.   Cardiovascular: Negative.   Gastrointestinal: Negative.  Negative for nausea, vomiting and abdominal pain.  Genitourinary: Positive for vaginal discharge.  Neurological: Negative.     Allergies  Sulfa antibiotics and Tramadol  Home Medications   Current Outpatient Rx  Name Route Sig Dispense Refill  . CITALOPRAM HYDROBROMIDE 20 MG PO TABS Oral Take 20 mg by mouth  every morning.     . IBUPROFEN 800 MG PO TABS Oral Take 800 mg by mouth every 8 (eight) hours as needed. For pain    . LAMOTRIGINE 25 MG PO TABS Oral Take 50 mg by mouth every morning.        BP 150/95  Pulse 91  Temp 98.2 F (36.8 C) (Oral)  Resp 16  SpO2 97%  LMP 04/20/2012  Physical Exam  Nursing note and vitals reviewed. Constitutional: She is oriented to person, place, and time. She appears well-developed and well-nourished.  Cardiovascular: Normal rate and regular rhythm.   Pulmonary/Chest: Effort normal and breath sounds normal.  Abdominal: Soft. Bowel sounds are normal. There is no tenderness. There is no CVA tenderness.  Genitourinary:       Pt has mild amount of vaginal bleeding with odor to the discharge  Neurological: She is alert and oriented to person, place, and time.  Skin: Skin is warm and dry.  Psychiatric: She has a normal mood and affect.    ED Course  Procedures (including critical care time)  Labs Reviewed  URINALYSIS, ROUTINE W REFLEX MICROSCOPIC - Abnormal; Notable for the following:    APPearance CLOUDY (*)     Hgb urine dipstick LARGE (*)     Protein, ur 30 (*)     Leukocytes, UA LARGE (*)     All other components  within normal limits  URINE MICROSCOPIC-ADD ON - Abnormal; Notable for the following:    Bacteria, UA MANY (*)     All other components within normal limits  WET PREP, GENITAL - Abnormal; Notable for the following:    Clue Cells Wet Prep HPF POC FEW (*)     WBC, Wet Prep HPF POC FEW (*)     All other components within normal limits  PREGNANCY, URINE  URINE CULTURE  GC/CHLAMYDIA PROBE AMP, GENITAL   No results found.   1. UTI (lower urinary tract infection)   2. BV (bacterial vaginosis)       MDM  Pt abdomen not acute:will treat from simple uti and bv:pt is not pregnant        Teressa Lower, NP 08/22/12 1920

## 2012-08-22 NOTE — ED Notes (Signed)
Pt states that she is having vaginal itching and odor x 1 month.  States that she "would go to her doctor but they're busy".

## 2012-08-22 NOTE — ED Notes (Signed)
Pt arrives by [facility] c/o vaginal odor and itching x's 1 month. Pt has been evaluated by Ob/gyn but has not been given prescriptions. Denies pain or burning.

## 2012-08-23 NOTE — ED Provider Notes (Signed)
Medical screening examination/treatment/procedure(s) were performed by non-physician practitioner and as supervising physician I was immediately available for consultation/collaboration.   Jovon Winterhalter H Keigo Whalley, MD 08/23/12 0654 

## 2012-08-24 LAB — GC/CHLAMYDIA PROBE AMP, GENITAL
Chlamydia, DNA Probe: NEGATIVE
GC Probe Amp, Genital: NEGATIVE

## 2012-08-25 LAB — URINE CULTURE: Colony Count: >=100000

## 2012-08-26 NOTE — ED Notes (Signed)
+  Urine. Patient treated with Cipro. Sensitive to same. Per protocol MD. °

## 2012-09-21 ENCOUNTER — Encounter (HOSPITAL_COMMUNITY): Payer: Self-pay | Admitting: Emergency Medicine

## 2012-09-21 ENCOUNTER — Emergency Department (HOSPITAL_COMMUNITY)
Admission: EM | Admit: 2012-09-21 | Discharge: 2012-09-22 | Disposition: A | Discharge status: Home / Self Care | Payer: Medicaid Other | Attending: Emergency Medicine | Admitting: Emergency Medicine

## 2012-09-21 DIAGNOSIS — Z8659 Personal history of other mental and behavioral disorders: Secondary | ICD-10-CM | POA: Insufficient documentation

## 2012-09-21 DIAGNOSIS — Z3202 Encounter for pregnancy test, result negative: Secondary | ICD-10-CM | POA: Insufficient documentation

## 2012-09-21 DIAGNOSIS — F319 Bipolar disorder, unspecified: Secondary | ICD-10-CM | POA: Insufficient documentation

## 2012-09-21 DIAGNOSIS — Z79899 Other long term (current) drug therapy: Secondary | ICD-10-CM | POA: Insufficient documentation

## 2012-09-21 DIAGNOSIS — M5430 Sciatica, unspecified side: Secondary | ICD-10-CM | POA: Insufficient documentation

## 2012-09-21 DIAGNOSIS — Z8742 Personal history of other diseases of the female genital tract: Secondary | ICD-10-CM | POA: Insufficient documentation

## 2012-09-21 LAB — CBC WITH DIFFERENTIAL/PLATELET
Basophils Absolute: 0 10*3/uL (ref 0.0–0.1)
Basophils Relative: 0 % (ref 0–1)
Eosinophils Absolute: 0 10*3/uL (ref 0.0–0.7)
Eosinophils Relative: 0 % (ref 0–5)
HCT: 37.6 % (ref 36.0–46.0)
Hemoglobin: 12.7 g/dL (ref 12.0–15.0)
Lymphocytes Relative: 22 % (ref 12–46)
Lymphs Abs: 1.5 10*3/uL (ref 0.7–4.0)
MCH: 32.1 pg (ref 26.0–34.0)
MCHC: 33.8 g/dL (ref 30.0–36.0)
MCV: 94.9 fL (ref 78.0–100.0)
Monocytes Absolute: 0.5 10*3/uL (ref 0.1–1.0)
Monocytes Relative: 8 % (ref 3–12)
Neutro Abs: 4.6 10*3/uL (ref 1.7–7.7)
Neutrophils Relative %: 70 % (ref 43–77)
Platelets: 227 10*3/uL (ref 150–400)
RBC: 3.96 MIL/uL (ref 3.87–5.11)
RDW: 16.4 % — ABNORMAL HIGH (ref 11.5–15.5)
WBC: 6.6 10*3/uL (ref 4.0–10.5)

## 2012-09-21 LAB — URINALYSIS, MICROSCOPIC ONLY
Glucose, UA: NEGATIVE mg/dL
Nitrite: POSITIVE — AB
Protein, ur: NEGATIVE mg/dL
Specific Gravity, Urine: 1.03 (ref 1.005–1.030)
Urobilinogen, UA: 1 mg/dL (ref 0.0–1.0)
pH: 5.5 (ref 5.0–8.0)

## 2012-09-21 LAB — COMPREHENSIVE METABOLIC PANEL
ALT: 14 U/L (ref 0–35)
AST: 18 U/L (ref 0–37)
Albumin: 3.6 g/dL (ref 3.5–5.2)
Alkaline Phosphatase: 110 U/L (ref 39–117)
BUN: 13 mg/dL (ref 6–23)
CO2: 27 mEq/L (ref 19–32)
Calcium: 9.1 mg/dL (ref 8.4–10.5)
Chloride: 101 mEq/L (ref 96–112)
Creatinine, Ser: 0.87 mg/dL (ref 0.50–1.10)
GFR calc Af Amer: >90 mL/min (ref >90)
GFR calc non Af Amer: 80 mL/min — ABNORMAL LOW (ref >90)
Glucose, Bld: 88 mg/dL (ref 70–99)
Potassium: 4.1 mEq/L (ref 3.5–5.1)
Sodium: 136 mEq/L (ref 135–145)
Total Bilirubin: 0.5 mg/dL (ref 0.3–1.2)
Total Protein: 6.9 g/dL (ref 6.0–8.3)

## 2012-09-21 LAB — PREGNANCY, URINE: Preg Test, Ur: NEGATIVE

## 2012-09-21 NOTE — ED Notes (Signed)
[id number]<YN> Expected date:09/21/12<BR> Expected time: 8:18 PM<BR> Means of arrival:Ambulance<BR> Comments:<BR> Flank pain

## 2012-09-21 NOTE — ED Notes (Signed)
Pt c/o flank pain with foul odor urine.pt c/o of pain in lower back for 6 months.pt has elevated temp.

## 2012-09-22 MED ORDER — FENTANYL CITRATE 0.05 MG/ML IJ SOLN
100.0000 ug | Freq: Once | INTRAMUSCULAR | Status: AC
  Administered 2012-09-22: 100 ug via INTRAVENOUS
  Filled 2012-09-22: qty 2

## 2012-09-22 MED ORDER — KETOROLAC TROMETHAMINE 30 MG/ML IJ SOLN
30.0000 mg | Freq: Once | INTRAMUSCULAR | Status: AC
  Administered 2012-09-22: 30 mg via INTRAVENOUS
  Filled 2012-09-22: qty 1

## 2012-09-22 MED ORDER — IBUPROFEN 600 MG PO TABS: 600.0000 mg | ORAL_TABLET | Freq: Four times a day (QID) | ORAL | Status: DC | PRN

## 2012-09-22 MED ORDER — HYDROCODONE-ACETAMINOPHEN 5-325 MG PO TABS: 2.0000 | ORAL_TABLET | ORAL | Status: DC | PRN

## 2012-09-22 MED ORDER — ONDANSETRON HCL 4 MG/2ML IJ SOLN
4.0000 mg | Freq: Once | INTRAMUSCULAR | Status: AC
  Administered 2012-09-22: 4 mg via INTRAVENOUS
  Filled 2012-09-22: qty 2

## 2012-09-22 MED ORDER — CYCLOBENZAPRINE HCL 10 MG PO TABS: 10.0000 mg | ORAL_TABLET | Freq: Two times a day (BID) | ORAL | Status: DC | PRN

## 2012-09-22 NOTE — ED Provider Notes (Signed)
History     CSN: [id number]  Arrival date & time 09/21/12  2029   First MD Initiated Contact with Patient 09/21/12 2357      Chief Complaint  Patient presents with  . Flank Pain    Patient is a 46 y.o. female presenting with flank pain.  Flank Pain   Patient with chief complaint of left-sided low back pain which radiates into left buttock and down just past left knee.  Patient states pain is been there for last 6 months been getting worse.  Pain is worse when she moves or stands for long period time.  Pain sometimes associated with numbness.  Patient denies any urinary or fecal incontinence. Past Medical History  Diagnosis Date  . Mental disorder   . Bipolar 1 disorder   . Bipolar 1 disorder   . Yeast infection of the vagina   . Bipolar disorder     Past Surgical History  Procedure Date  . No past surgeries     No family history on file.  History  Substance Use Topics  . Smoking status: Never Smoker   . Smokeless tobacco: Never Used  . Alcohol Use: No    OB History    Grav Para Term Preterm Abortions TAB SAB Ect Mult Living   1 1 1       1       Review of Systems  Genitourinary: Positive for flank pain.  All other systems reviewed and are negative.    Allergies  Sulfa antibiotics and Tramadol  Home Medications   Current Outpatient Rx  Name  Route  Sig  Dispense  Refill  . CITALOPRAM HYDROBROMIDE 20 MG PO TABS   Oral   Take 20 mg by mouth every morning.          . IBUPROFEN 800 MG PO TABS   Oral   Take 800 mg by mouth every 8 (eight) hours as needed. For pain         . LAMOTRIGINE 25 MG PO TABS   Oral   Take 50 mg by mouth every morning.           . CYCLOBENZAPRINE HCL 10 MG PO TABS   Oral   Take 1 tablet (10 mg total) by mouth 2 (two) times daily as needed for muscle spasms.   20 tablet   0   . HYDROCODONE-ACETAMINOPHEN 5-325 MG PO TABS   Oral   Take 2 tablets by mouth every 4 (four) hours as needed for pain.   10 tablet   0   .  IBUPROFEN 600 MG PO TABS   Oral   Take 1 tablet (600 mg total) by mouth every 6 (six) hours as needed for pain.   30 tablet   0     BP 134/57  Pulse 83  Temp 97.7 F (36.5 C) (Oral)  Resp 18  SpO2 98%  LMP 09/13/2012  Physical Exam  Nursing note and vitals reviewed. Constitutional: She is oriented to person, place, and time. She appears well-developed and well-nourished. No distress.         Patient has morbid obesity with a weight of approximately 340 pounds.  HENT:  Head: Normocephalic and atraumatic.  Eyes: Pupils are equal, round, and reactive to light.  Neck: Normal range of motion.  Cardiovascular: Normal rate and intact distal pulses.   Pulmonary/Chest: No respiratory distress.  Abdominal: Normal appearance. She exhibits no distension.  Musculoskeletal: Normal range of motion.  Neurological: She is  alert and oriented to person, place, and time. No cranial nerve deficit.  Skin: Skin is warm and dry. No rash noted.  Psychiatric: She has a normal mood and affect. Her behavior is normal.    ED Course  Procedures (including critical care time)  Labs Reviewed  URINALYSIS, MICROSCOPIC ONLY - Abnormal; Notable for the following:    Color, Urine AMBER (*)  BIOCHEMICALS MAY BE AFFECTED BY COLOR   APPearance CLOUDY (*)     Hgb urine dipstick SMALL (*)     Bilirubin Urine SMALL (*)     Ketones, ur TRACE (*)     Nitrite POSITIVE (*)     Leukocytes, UA LARGE (*)     Bacteria, UA MANY (*)     Squamous Epithelial / LPF MANY (*)     All other components within normal limits  CBC WITH DIFFERENTIAL - Abnormal; Notable for the following:    RDW 16.4 (*)     All other components within normal limits  COMPREHENSIVE METABOLIC PANEL - Abnormal; Notable for the following:    GFR calc non Af Amer 80 (*)     All other components within normal limits  PREGNANCY, URINE  URINE CULTURE   No results found.   1. Sciatica   2. Morbid obesity       MDM  Discussed with the  patient the need for significant weight loss in order for the symptoms to improve.        Nelia Shi, MD 09/22/12 [facility]

## 2012-09-23 LAB — URINE CULTURE: Colony Count: >=100000

## 2012-09-24 NOTE — ED Notes (Signed)
+   Urine Chart sent to EDP office for review. 

## 2012-09-27 ENCOUNTER — Telehealth (HOSPITAL_COMMUNITY): Payer: Self-pay | Admitting: Emergency Medicine

## 2012-09-27 NOTE — ED Notes (Signed)
Chart returned from EDP office. Prescribed Cipro 500 mg PO BID x 5 days. #10. Prescribed by Vrinda Pickering NP. °

## 2012-10-02 NOTE — ED Notes (Signed)
Unable to contact via phone ,Letter sent to [facility] address. 

## 2013-01-07 ENCOUNTER — Emergency Department (HOSPITAL_COMMUNITY)
Admission: EM | Admit: 2013-01-07 | Discharge: 2013-01-07 | Disposition: A | Discharge status: Home / Self Care | Payer: Medicaid Other | Attending: Emergency Medicine | Admitting: Emergency Medicine

## 2013-01-07 ENCOUNTER — Encounter (HOSPITAL_COMMUNITY): Payer: Self-pay | Admitting: Emergency Medicine

## 2013-01-07 DIAGNOSIS — R42 Dizziness and giddiness: Secondary | ICD-10-CM

## 2013-01-07 DIAGNOSIS — F319 Bipolar disorder, unspecified: Secondary | ICD-10-CM | POA: Insufficient documentation

## 2013-01-07 DIAGNOSIS — M791 Myalgia, unspecified site: Secondary | ICD-10-CM

## 2013-01-07 DIAGNOSIS — R110 Nausea: Secondary | ICD-10-CM | POA: Insufficient documentation

## 2013-01-07 DIAGNOSIS — Z8619 Personal history of other infectious and parasitic diseases: Secondary | ICD-10-CM | POA: Insufficient documentation

## 2013-01-07 DIAGNOSIS — Z7982 Long term (current) use of aspirin: Secondary | ICD-10-CM | POA: Insufficient documentation

## 2013-01-07 DIAGNOSIS — Z79899 Other long term (current) drug therapy: Secondary | ICD-10-CM | POA: Insufficient documentation

## 2013-01-07 DIAGNOSIS — IMO0001 Reserved for inherently not codable concepts without codable children: Secondary | ICD-10-CM | POA: Insufficient documentation

## 2013-01-07 DIAGNOSIS — Z3202 Encounter for pregnancy test, result negative: Secondary | ICD-10-CM | POA: Insufficient documentation

## 2013-01-07 LAB — URINALYSIS, ROUTINE W REFLEX MICROSCOPIC
Bilirubin Urine: NEGATIVE
Glucose, UA: NEGATIVE mg/dL
Hgb urine dipstick: NEGATIVE
Ketones, ur: NEGATIVE mg/dL
Leukocytes, UA: NEGATIVE
Nitrite: NEGATIVE
Protein, ur: NEGATIVE mg/dL
Specific Gravity, Urine: 1.027 (ref 1.005–1.030)
Urobilinogen, UA: 1 mg/dL (ref 0.0–1.0)
pH: 7.5 (ref 5.0–8.0)

## 2013-01-07 LAB — POCT I-STAT, CHEM 8
BUN: 13 mg/dL (ref 6–23)
Calcium, Ion: 1.14 mmol/L (ref 1.12–1.23)
Chloride: 105 mEq/L (ref 96–112)
Creatinine, Ser: 0.8 mg/dL (ref 0.50–1.10)
Glucose, Bld: 98 mg/dL (ref 70–99)
HCT: 42 % (ref 36.0–46.0)
Hemoglobin: 14.3 g/dL (ref 12.0–15.0)
Potassium: 4.1 mEq/L (ref 3.5–5.1)
Sodium: 140 mEq/L (ref 135–145)
TCO2: 26 mmol/L (ref 0–100)

## 2013-01-07 LAB — PREGNANCY, URINE: Preg Test, Ur: NEGATIVE

## 2013-01-07 MED ORDER — SODIUM CHLORIDE 0.9 % IV BOLUS (SEPSIS)
1000.0000 mL | Freq: Once | INTRAVENOUS | Status: AC
  Administered 2013-01-07: 1000 mL via INTRAVENOUS

## 2013-01-07 MED ORDER — OXYCODONE-ACETAMINOPHEN 5-325 MG PO TABS
1.0000 | ORAL_TABLET | Freq: Once | ORAL | Status: AC
  Administered 2013-01-07: 1 via ORAL
  Filled 2013-01-07: qty 1

## 2013-01-07 MED ORDER — IBUPROFEN 600 MG PO TABS: 600.0000 mg | ORAL_TABLET | Freq: Four times a day (QID) | ORAL | Status: DC | PRN

## 2013-01-07 MED ORDER — ONDANSETRON 8 MG PO TBDP
8.0000 mg | ORAL_TABLET | ORAL | Status: AC
  Administered 2013-01-07: 8 mg via ORAL
  Filled 2013-01-07: qty 1

## 2013-01-07 NOTE — ED Notes (Signed)
States that she has a headache and hurts all over. States that she is nausea

## 2013-01-07 NOTE — ED Notes (Signed)
Voiced understanding of instructions given 

## 2013-01-07 NOTE — ED Notes (Signed)
Per EMS--pt had no specific complaint. States that she has generalized pain and unable to describe. States that she has anxiety, dizziness, and a host of other complaints. No vomiting on route.

## 2013-01-07 NOTE — ED Notes (Signed)
[id number]<AV> Expected date:<BR> Expected time:<BR> Means of arrival:<BR> Comments:<BR> nauesa

## 2013-01-07 NOTE — Progress Notes (Signed)
Pt confirms pcp is [facility] Hassan [facility] updated       

## 2013-01-07 NOTE — ED Provider Notes (Signed)
History     CSN: [id number]  Arrival date & time 01/07/13  1020   First MD Initiated Contact with Patient 01/07/13 1122      Chief Complaint  Patient presents with  . Headache  . Nausea    (Consider location/radiation/quality/duration/timing/severity/associated sxs/prior treatment) HPI Pt presents with c/o body aches, headache as well as dizziness.  She states symptoms began yesterday and she felt worse this morning.  .  She states she also feels nauseated but is unable to vomit.  Denies diarrhea or abdomial pain.  No fever/chills.  No known sick contacts.  Has not had any treatment prior to arrival. There are no other associated systemic symptoms, there are no other alleviating or modifying factors.   Past Medical History  Diagnosis Date  . Mental disorder   . Bipolar 1 disorder   . Bipolar 1 disorder   . Yeast infection of the vagina   . Bipolar disorder     Past Surgical History  Procedure Laterality Date  . No past surgeries      No family history on file.  History  Substance Use Topics  . Smoking status: Never Smoker   . Smokeless tobacco: Never Used  . Alcohol Use: No    OB History   Grav Para Term Preterm Abortions TAB SAB Ect Mult Living   1 1 1       1       Review of Systems ROS reviewed and all otherwise negative except for mentioned in HPI  Allergies  Sulfa antibiotics and Tramadol  Home Medications   Current Outpatient Rx  Name  Route  Sig  Dispense  Refill  . aspirin 325 MG tablet   Oral   Take 325 mg by mouth every 6 (six) hours as needed for pain.         . citalopram (CELEXA) 20 MG tablet   Oral   Take 20 mg by mouth every morning.          . lamoTRIgine (LAMICTAL) 25 MG tablet   Oral   Take 50 mg by mouth every morning.           Marland Kitchen ibuprofen (ADVIL,MOTRIN) 600 MG tablet   Oral   Take 1 tablet (600 mg total) by mouth every 6 (six) hours as needed for pain.   30 tablet   0     BP 134/98  Pulse 91  Temp(Src) 99.4 F  (37.4 C)  Resp 20  SpO2 93%  LMP 12/27/2012 Vitals reviewed Physical Exam Physical Examination: General appearance - alert, well appearing, and in no distress Mental status - alert, oriented to person, place, and time Eyes - no scleral icterus, no conjunctival injection Mouth - mucous membranes moist, pharynx normal without lesions Neck - supple, no significant adenopathy Chest - clear to auscultation, no wheezes, rales or rhonchi, symmetric air entry Heart - normal rate, regular rhythm, normal S1, S2, no murmurs, rubs, clicks or gallops Abdomen - soft, nontender, nondistended, no masses or organomegaly Neurological - alert, oriented, normal speech, no focal findings Extremities - peripheral pulses normal, no pedal edema, no clubbing or cyanosis Skin - normal coloration and turgor, no rashes  ED Course  Procedures (including critical care time)  Labs Reviewed  URINALYSIS, ROUTINE W REFLEX MICROSCOPIC - Abnormal; Notable for the following:    APPearance CLOUDY (*)    All other components within normal limits  PREGNANCY, URINE  POCT I-STAT, CHEM 8   No results found.  1. Lightheadedness   2. Myalgia       MDM  Pt presenting with c/o diffuse body aches, headache and nausea.  Pt appears overall nontoxic and well hydrated.  She was treated with zofran, given IV hydration, pain treated.  Suspect viral illness, low suspicion for any acute emergent condition that requires further intervention in the ED.  Discharged with strict return precautions.  Pt agreeable with plan.        Ethelda Chick, MD 01/08/13 [phone]

## 2013-01-10 ENCOUNTER — Encounter (HOSPITAL_COMMUNITY): Payer: Self-pay

## 2013-01-10 ENCOUNTER — Emergency Department (HOSPITAL_COMMUNITY)
Admission: EM | Admit: 2013-01-10 | Discharge: 2013-01-10 | Disposition: A | Discharge status: Home / Self Care | Payer: Medicaid Other | Attending: Emergency Medicine | Admitting: Emergency Medicine

## 2013-01-10 DIAGNOSIS — N390 Urinary tract infection, site not specified: Secondary | ICD-10-CM | POA: Insufficient documentation

## 2013-01-10 DIAGNOSIS — F489 Nonpsychotic mental disorder, unspecified: Secondary | ICD-10-CM | POA: Insufficient documentation

## 2013-01-10 DIAGNOSIS — Z79899 Other long term (current) drug therapy: Secondary | ICD-10-CM | POA: Insufficient documentation

## 2013-01-10 DIAGNOSIS — R197 Diarrhea, unspecified: Secondary | ICD-10-CM | POA: Insufficient documentation

## 2013-01-10 DIAGNOSIS — Z3202 Encounter for pregnancy test, result negative: Secondary | ICD-10-CM | POA: Insufficient documentation

## 2013-01-10 DIAGNOSIS — Z8742 Personal history of other diseases of the female genital tract: Secondary | ICD-10-CM | POA: Insufficient documentation

## 2013-01-10 DIAGNOSIS — F319 Bipolar disorder, unspecified: Secondary | ICD-10-CM | POA: Insufficient documentation

## 2013-01-10 LAB — URINALYSIS, ROUTINE W REFLEX MICROSCOPIC
Glucose, UA: NEGATIVE mg/dL
Hgb urine dipstick: NEGATIVE
Ketones, ur: NEGATIVE mg/dL
Nitrite: POSITIVE — AB
Protein, ur: 30 mg/dL — AB
Specific Gravity, Urine: 1.034 — ABNORMAL HIGH (ref 1.005–1.030)
Urobilinogen, UA: 1 mg/dL (ref 0.0–1.0)
pH: 6 (ref 5.0–8.0)

## 2013-01-10 LAB — CBC WITH DIFFERENTIAL/PLATELET
Basophils Absolute: 0 10*3/uL (ref 0.0–0.1)
Basophils Relative: 0 % (ref 0–1)
Eosinophils Absolute: 0 10*3/uL (ref 0.0–0.7)
Eosinophils Relative: 0 % (ref 0–5)
HCT: 39.1 % (ref 36.0–46.0)
Hemoglobin: 13.3 g/dL (ref 12.0–15.0)
Lymphocytes Relative: 38 % (ref 12–46)
Lymphs Abs: 1.3 10*3/uL (ref 0.7–4.0)
MCH: 33.8 pg (ref 26.0–34.0)
MCHC: 34 g/dL (ref 30.0–36.0)
MCV: 99.5 fL (ref 78.0–100.0)
Monocytes Absolute: 0.3 10*3/uL (ref 0.1–1.0)
Monocytes Relative: 8 % (ref 3–12)
Neutro Abs: 1.9 10*3/uL (ref 1.7–7.7)
Neutrophils Relative %: 54 % (ref 43–77)
Platelets: 199 10*3/uL (ref 150–400)
RBC: 3.93 MIL/uL (ref 3.87–5.11)
RDW: 14.6 % (ref 11.5–15.5)
WBC: 3.5 10*3/uL — ABNORMAL LOW (ref 4.0–10.5)

## 2013-01-10 LAB — BASIC METABOLIC PANEL
BUN: 8 mg/dL (ref 6–23)
CO2: 26 mEq/L (ref 19–32)
Calcium: 8.5 mg/dL (ref 8.4–10.5)
Chloride: 106 mEq/L (ref 96–112)
Creatinine, Ser: 0.71 mg/dL (ref 0.50–1.10)
GFR calc Af Amer: >90 mL/min (ref >90)
GFR calc non Af Amer: >90 mL/min (ref >90)
Glucose, Bld: 83 mg/dL (ref 70–99)
Potassium: 3.2 mEq/L — ABNORMAL LOW (ref 3.5–5.1)
Sodium: 141 mEq/L (ref 135–145)

## 2013-01-10 LAB — POCT PREGNANCY, URINE: Preg Test, Ur: NEGATIVE

## 2013-01-10 LAB — URINE MICROSCOPIC-ADD ON

## 2013-01-10 MED ORDER — NITROFURANTOIN MONOHYD MACRO 100 MG PO CAPS: 100.0000 mg | ORAL_CAPSULE | Freq: Once | ORAL | Status: DC

## 2013-01-10 MED ORDER — NITROFURANTOIN MONOHYD MACRO 100 MG PO CAPS
100.0000 mg | ORAL_CAPSULE | Freq: Once | ORAL | Status: AC
  Administered 2013-01-10: 100 mg via ORAL
  Filled 2013-01-10: qty 1

## 2013-01-10 MED ORDER — LOPERAMIDE HCL 2 MG PO CAPS: 2.0000 mg | ORAL_CAPSULE | ORAL | Status: DC | PRN

## 2013-01-10 MED ORDER — LOPERAMIDE HCL 2 MG PO CAPS
4.0000 mg | ORAL_CAPSULE | Freq: Once | ORAL | Status: AC
  Administered 2013-01-10: 4 mg via ORAL
  Filled 2013-01-10: qty 2

## 2013-01-10 MED ORDER — DICYCLOMINE HCL 10 MG PO CAPS: 10.0000 mg | ORAL_CAPSULE | Freq: Three times a day (TID) | ORAL | Status: DC

## 2013-01-10 MED ORDER — POTASSIUM CHLORIDE CRYS ER 20 MEQ PO TBCR
40.0000 meq | EXTENDED_RELEASE_TABLET | Freq: Once | ORAL | Status: AC
  Administered 2013-01-10: 40 meq via ORAL
  Filled 2013-01-10: qty 2

## 2013-01-10 MED ORDER — DICYCLOMINE HCL 10 MG PO CAPS
10.0000 mg | ORAL_CAPSULE | Freq: Once | ORAL | Status: AC
Start: 2013-01-10 — End: 2013-01-10
  Administered 2013-01-10: 10 mg via ORAL
  Filled 2013-01-10: qty 1

## 2013-01-10 NOTE — ED Notes (Signed)
Pt presents with NAD- Pt seen at [facility] 01/07/13 for something different.  Diarrhea began on Friday. Pt reports diarrhea began on Friday.  Multiple episodes since.

## 2013-01-10 NOTE — ED Notes (Signed)
NP at bedside at this time speaking to pt

## 2013-01-10 NOTE — ED Provider Notes (Signed)
History     CSN: [id number]  Arrival date & time 01/10/13  1752   First MD Initiated Contact with Patient 01/10/13 1954      Chief Complaint  Patient presents with  . Diarrhea    (Consider location/radiation/quality/duration/timing/severity/associated sxs/prior treatment) HPI Comments: Patient is until 3 days of loose, watery stools, associated with cramping, no nausea, or vomiting.  She was seen one day prior to that in the emergency department for nausea, and dizziness.  At that time.  Her urine tested negative for infection.  She states she has severe cramping just prior to bowel movement, which is her major concern for evaluation.  In the emergency department today.  Has not taken any medication for her symptoms except homes after the fact  Patient is a 47 y.o. female presenting with diarrhea. The history is provided by the patient.  Diarrhea Quality:  Watery Severity:  Mild Onset quality:  Sudden Duration:  3 days Timing:  Intermittent Progression:  Unchanged Relieved by:  Nothing Worsened by:  Nothing tried Associated symptoms: abdominal pain   Associated symptoms: no chills, no fever, no headaches and no vomiting   Abdominal pain:    Location:  Suprapubic   Quality:  Cramping   Severity:  Mild   Onset quality:  Sudden   Duration:  3 days   Timing:  Intermittent   Progression:  Unchanged   Chronicity:  New   Past Medical History  Diagnosis Date  . Mental disorder   . Bipolar 1 disorder   . Bipolar 1 disorder   . Yeast infection of the vagina   . Bipolar disorder     Past Surgical History  Procedure Laterality Date  . No past surgeries      No family history on file.  History  Substance Use Topics  . Smoking status: Never Smoker   . Smokeless tobacco: Never Used  . Alcohol Use: No    OB History   Grav Para Term Preterm Abortions TAB SAB Ect Mult Living   1 1 1       1       Review of Systems  Constitutional: Negative for fever and chills.   Gastrointestinal: Positive for abdominal pain and diarrhea. Negative for nausea, vomiting and constipation.  Genitourinary: Negative for dysuria.  Musculoskeletal: Negative for back pain.  Skin: Negative for rash.  Neurological: Negative for dizziness, weakness and headaches.  All other systems reviewed and are negative.    Allergies  Sulfa antibiotics and Tramadol  Home Medications   Current Outpatient Rx  Name  Route  Sig  Dispense  Refill  . citalopram (CELEXA) 20 MG tablet   Oral   Take 20 mg by mouth every morning.          Marland Kitchen ibuprofen (ADVIL,MOTRIN) 800 MG tablet   Oral   Take 800 mg by mouth every 8 (eight) hours as needed for pain.         Marland Kitchen lamoTRIgine (LAMICTAL) 25 MG tablet   Oral   Take 50 mg by mouth every morning.           . dicyclomine (BENTYL) 10 MG capsule   Oral   Take 1 capsule (10 mg total) by mouth 3 (three) times daily before meals.   9 capsule   0   . loperamide (IMODIUM) 2 MG capsule   Oral   Take 1 capsule (2 mg total) by mouth as needed for diarrhea or loose stools (after loose BM).  20 capsule   0   . nitrofurantoin, macrocrystal-monohydrate, (MACROBID) 100 MG capsule   Oral   Take 1 capsule (100 mg total) by mouth once.   13 capsule   0     BP 130/84  Pulse 79  Temp(Src) 98.1 F (36.7 C) (Oral)  Resp 18  Ht 5\' 8"  (1.727 m)  Wt 360 lb (163.295 kg)  BMI 54.75 kg/m2  SpO2 100%  LMP 12/27/2012  Physical Exam  Nursing note and vitals reviewed. Constitutional: She appears well-developed and well-nourished.  HENT:  Head: Normocephalic.  Eyes: Pupils are equal, round, and reactive to light.  Neck: Normal range of motion.  Cardiovascular: Normal rate.   Pulmonary/Chest: Effort normal.  Abdominal: Soft. She exhibits no distension and no mass. There is no tenderness.  Musculoskeletal: Normal range of motion.  Neurological: She is alert.  Skin: No erythema.    ED Course  Procedures (including critical care  time)  Labs Reviewed  BASIC METABOLIC PANEL - Abnormal; Notable for the following:    Potassium 3.2 (*)    All other components within normal limits  CBC WITH DIFFERENTIAL - Abnormal; Notable for the following:    WBC 3.5 (*)    All other components within normal limits  URINALYSIS, ROUTINE W REFLEX MICROSCOPIC - Abnormal; Notable for the following:    Color, Urine AMBER (*)    APPearance CLOUDY (*)    Specific Gravity, Urine 1.034 (*)    Bilirubin Urine SMALL (*)    Protein, ur 30 (*)    Nitrite POSITIVE (*)    Leukocytes, UA LARGE (*)    All other components within normal limits  URINE MICROSCOPIC-ADD ON - Abnormal; Notable for the following:    Squamous Epithelial / LPF MANY (*)    Bacteria, UA MANY (*)    All other components within normal limits  URINE CULTURE  POCT PREGNANCY, URINE   No results found.   1. Diarrhea   2. UTI (lower urinary tract infection)       MDM  Patient's potassium is slightly low, at 3.1.  I will supplement his in the emergency department, and gave her a list of potassium rich foods to follow, since she's only having one to 2 loose bowel movements a, day.  We'll treat her with over-the-counter I note, Imodium, we'll start to resume in the emergency department.  She also has a, UTI.  We'll start treatment for that and encouraged her to followup with her primary care physician next week         Arman Filter, NP 01/11/13 [phone]

## 2013-01-10 NOTE — ED Notes (Signed)
Pt has no other complaints

## 2013-01-12 NOTE — ED Provider Notes (Signed)
Medical screening examination/treatment/procedure(s) were performed by non-physician practitioner and as supervising physician I was immediately available for consultation/collaboration.  Donnetta Hutching, MD 01/12/13 1047

## 2013-01-13 LAB — URINE CULTURE: Colony Count: >=100000

## 2013-01-14 ENCOUNTER — Telehealth (HOSPITAL_COMMUNITY): Payer: Self-pay | Admitting: Emergency Medicine

## 2013-01-14 NOTE — ED Notes (Signed)
Positive urnc- treated per protocol. No further follow up at this time.  

## 2013-05-03 ENCOUNTER — Encounter (HOSPITAL_COMMUNITY): Payer: Self-pay

## 2013-05-03 ENCOUNTER — Emergency Department (HOSPITAL_COMMUNITY)
Admission: EM | Admit: 2013-05-03 | Discharge: 2013-05-03 | Disposition: A | Discharge status: Home / Self Care | Payer: Medicaid Other | Attending: Emergency Medicine | Admitting: Emergency Medicine

## 2013-05-03 DIAGNOSIS — N390 Urinary tract infection, site not specified: Secondary | ICD-10-CM

## 2013-05-03 DIAGNOSIS — R109 Unspecified abdominal pain: Secondary | ICD-10-CM

## 2013-05-03 DIAGNOSIS — R42 Dizziness and giddiness: Secondary | ICD-10-CM | POA: Insufficient documentation

## 2013-05-03 DIAGNOSIS — R197 Diarrhea, unspecified: Secondary | ICD-10-CM

## 2013-05-03 DIAGNOSIS — Z3202 Encounter for pregnancy test, result negative: Secondary | ICD-10-CM | POA: Insufficient documentation

## 2013-05-03 DIAGNOSIS — F319 Bipolar disorder, unspecified: Secondary | ICD-10-CM | POA: Insufficient documentation

## 2013-05-03 DIAGNOSIS — Z79899 Other long term (current) drug therapy: Secondary | ICD-10-CM | POA: Insufficient documentation

## 2013-05-03 LAB — CBC
HCT: 36.6 % (ref 36.0–46.0)
Hemoglobin: 12.7 g/dL (ref 12.0–15.0)
MCH: 34 pg (ref 26.0–34.0)
MCHC: 34.7 g/dL (ref 30.0–36.0)
MCV: 97.9 fL (ref 78.0–100.0)
Platelets: 175 10*3/uL (ref 150–400)
RBC: 3.74 MIL/uL — ABNORMAL LOW (ref 3.87–5.11)
RDW: 17.2 % — ABNORMAL HIGH (ref 11.5–15.5)
WBC: 5.4 10*3/uL (ref 4.0–10.5)

## 2013-05-03 LAB — URINALYSIS, ROUTINE W REFLEX MICROSCOPIC
Bilirubin Urine: NEGATIVE
Glucose, UA: NEGATIVE mg/dL
Hgb urine dipstick: NEGATIVE
Ketones, ur: NEGATIVE mg/dL
Nitrite: POSITIVE — AB
Protein, ur: NEGATIVE mg/dL
Specific Gravity, Urine: 1.025 (ref 1.005–1.030)
Urobilinogen, UA: 1 mg/dL (ref 0.0–1.0)
pH: 6 (ref 5.0–8.0)

## 2013-05-03 LAB — COMPREHENSIVE METABOLIC PANEL WITH GFR
ALT: 17 U/L (ref 0–35)
AST: 19 U/L (ref 0–37)
Albumin: 3.2 g/dL — ABNORMAL LOW (ref 3.5–5.2)
Alkaline Phosphatase: 101 U/L (ref 39–117)
BUN: 8 mg/dL (ref 6–23)
CO2: 26 meq/L (ref 19–32)
Calcium: 8.7 mg/dL (ref 8.4–10.5)
Chloride: 106 meq/L (ref 96–112)
Creatinine, Ser: 0.7 mg/dL (ref 0.50–1.10)
GFR calc Af Amer: >90 mL/min
GFR calc non Af Amer: >90 mL/min
Glucose, Bld: 90 mg/dL (ref 70–99)
Potassium: 4 meq/L (ref 3.5–5.1)
Sodium: 140 meq/L (ref 135–145)
Total Bilirubin: 0.4 mg/dL (ref 0.3–1.2)
Total Protein: 6.3 g/dL (ref 6.0–8.3)

## 2013-05-03 LAB — URINE MICROSCOPIC-ADD ON

## 2013-05-03 LAB — PREGNANCY, URINE: Preg Test, Ur: NEGATIVE

## 2013-05-03 LAB — LIPASE, BLOOD: Lipase: 24 U/L (ref 11–59)

## 2013-05-03 MED ORDER — NAPROXEN 500 MG PO TABS
500.0000 mg | ORAL_TABLET | Freq: Two times a day (BID) | ORAL | Status: DC
Start: 2013-05-03 — End: 2013-05-27

## 2013-05-03 MED ORDER — CIPROFLOXACIN HCL 500 MG PO TABS: 500.0000 mg | ORAL_TABLET | Freq: Two times a day (BID) | ORAL | Status: DC

## 2013-05-03 MED ORDER — KETOROLAC TROMETHAMINE 30 MG/ML IJ SOLN
30.0000 mg | Freq: Once | INTRAMUSCULAR | Status: AC
  Administered 2013-05-03: 30 mg via INTRAVENOUS
  Filled 2013-05-03: qty 1

## 2013-05-03 MED ORDER — DICYCLOMINE HCL 10 MG PO CAPS
10.0000 mg | ORAL_CAPSULE | Freq: Once | ORAL | Status: AC
  Administered 2013-05-03: 10 mg via ORAL
  Filled 2013-05-03: qty 1

## 2013-05-03 NOTE — ED Notes (Signed)
Abdominal pain in all quadrants and diarrhea x2 months, states been taken ibuprofen with no relief. Pt in no distress

## 2013-05-03 NOTE — ED Provider Notes (Signed)
History    CSN: [id number] Arrival date & time 05/03/13  1807  First MD Initiated Contact with Patient 05/03/13 1808     Chief Complaint  Patient presents with  . Abdominal Pain  . Diarrhea   (Consider location/radiation/quality/duration/timing/severity/associated sxs/prior Treatment) HPI Comments: 47 yo female with bipolar has had intermittent abd pain for 2 months, acidity feeling, similar to previous.  At times worse after eating. Worse with bipolar meds.  Sharp intermittent pain supraumbilical.  Pt has gb.  No fevers.  Pt trying bentel, minimal help.  No urinary changes. Pt frustrated by pain.    Patient is a 47 y.o. female presenting with abdominal pain and diarrhea. The history is provided by the patient.  Abdominal Pain This is a recurrent problem. Associated symptoms include abdominal pain. Pertinent negatives include no chest pain, no headaches and no shortness of breath.  Diarrhea Associated symptoms: abdominal pain   Associated symptoms: no chills, no fever, no headaches and no vomiting    Past Medical History  Diagnosis Date  . Mental disorder   . Bipolar 1 disorder   . Bipolar 1 disorder   . Yeast infection of the vagina   . Bipolar disorder    Past Surgical History  Procedure Laterality Date  . No past surgeries     No family history on file. History  Substance Use Topics  . Smoking status: Never Smoker   . Smokeless tobacco: Never Used  . Alcohol Use: No   OB History   Grav Para Term Preterm Abortions TAB SAB Ect Mult Living   1 1 1       1      Review of Systems  Constitutional: Negative for fever and chills.  HENT: Negative for neck pain and neck stiffness.   Eyes: Negative for visual disturbance.  Respiratory: Negative for shortness of breath.   Cardiovascular: Negative for chest pain.  Gastrointestinal: Positive for abdominal pain and diarrhea. Negative for nausea and vomiting.  Genitourinary: Negative for dysuria and flank pain.   Musculoskeletal: Negative for back pain.  Skin: Negative for rash.  Neurological: Positive for light-headedness. Negative for headaches.    Allergies  Sulfa antibiotics and Tramadol  Home Medications   Current Outpatient Rx  Name  Route  Sig  Dispense  Refill  . citalopram (CELEXA) 20 MG tablet   Oral   Take 20 mg by mouth every morning.          Marland Kitchen EXPIRED: dicyclomine (BENTYL) 10 MG capsule   Oral   Take 1 capsule (10 mg total) by mouth 3 (three) times daily before meals.   9 capsule   0   . ibuprofen (ADVIL,MOTRIN) 800 MG tablet   Oral   Take 800 mg by mouth every 8 (eight) hours as needed for pain.         Marland Kitchen lamoTRIgine (LAMICTAL) 25 MG tablet   Oral   Take 50 mg by mouth every morning.           . loperamide (IMODIUM) 2 MG capsule   Oral   Take 1 capsule (2 mg total) by mouth as needed for diarrhea or loose stools (after loose BM).   20 capsule   0   . nitrofurantoin, macrocrystal-monohydrate, (MACROBID) 100 MG capsule   Oral   Take 1 capsule (100 mg total) by mouth once.   13 capsule   0    BP 103/76  Pulse 99  Temp(Src) 98.8 F (37.1 C) (Oral)  Resp  20  SpO2 95% Physical Exam  Nursing note and vitals reviewed. Constitutional: She is oriented to person, place, and time. She appears well-developed and well-nourished.  HENT:  Head: Normocephalic and atraumatic.  Eyes: Conjunctivae are normal. Right eye exhibits no discharge. Left eye exhibits no discharge.  Neck: Normal range of motion. Neck supple. No tracheal deviation present.  Cardiovascular: Normal rate and regular rhythm.   Pulmonary/Chest: Effort normal and breath sounds normal.  Abdominal: Soft. She exhibits no distension. There is tenderness (central/ epig). There is no guarding.  Musculoskeletal: She exhibits no edema.  Neurological: She is alert and oriented to person, place, and time.  Skin: Skin is warm. No rash noted.  Psychiatric: She has a normal mood and affect.    ED Course   Procedures (including critical care time) Labs Reviewed - No data to display No results found. No diagnosis found.  MDM  Well appearing. Benign exam.  Return instructions given.  UTI with possible enteritis vs gerd. No RLQ pain. PO abx and home Labs Reviewed  URINALYSIS, ROUTINE W REFLEX MICROSCOPIC - Abnormal; Notable for the following:    Color, Urine AMBER (*)    APPearance CLOUDY (*)    Nitrite POSITIVE (*)    Leukocytes, UA LARGE (*)    All other components within normal limits  COMPREHENSIVE METABOLIC PANEL - Abnormal; Notable for the following:    Albumin 3.2 (*)    All other components within normal limits  CBC - Abnormal; Notable for the following:    RBC 3.74 (*)    RDW 17.2 (*)    All other components within normal limits  URINE MICROSCOPIC-ADD ON - Abnormal; Notable for the following:    Squamous Epithelial / LPF FEW (*)    Bacteria, UA MANY (*)    Casts HYALINE CASTS (*)    All other components within normal limits  URINE CULTURE  PREGNANCY, URINE  LIPASE, BLOOD   DC  Enid Skeens, MD 05/03/13 2010

## 2013-05-03 NOTE — ED Notes (Signed)
Pt is getting blood drawn but will go to restroom after to get urine sample.

## 2013-05-03 NOTE — ED Notes (Signed)
Bed:WA22<BR> Expected date:<BR> Expected time:<BR> Means of arrival:<BR> Comments:<BR> ems °

## 2013-05-06 LAB — URINE CULTURE: Colony Count: >=100000

## 2013-05-07 ENCOUNTER — Telehealth (HOSPITAL_COMMUNITY): Payer: Self-pay | Admitting: Emergency Medicine

## 2013-05-07 NOTE — ED Notes (Signed)
Post ED Visit - Positive Culture Follow-up  Culture report reviewed by antimicrobial stewardship pharmacist: [x]  Wes Dulaney, Pharm.D., BCPS []  Celedonio Miyamoto, Pharm.D., BCPS []  Georgina Pillion, [address].D., BCPS []  [place], [address].D., BCPS, AAHIVP []  Estella Husk, Pharm.D., BCPS, AAHIVP  Positive urine culture Treated with cipro, organism sensitive to the same and no further patient follow-up is required at this time.  Ashley Jacobs 05/07/2013, 9:55 AM

## 2013-05-27 ENCOUNTER — Encounter (HOSPITAL_COMMUNITY): Payer: Self-pay | Admitting: Adult Health

## 2013-05-27 ENCOUNTER — Emergency Department (HOSPITAL_COMMUNITY): Payer: Medicaid Other

## 2013-05-27 ENCOUNTER — Emergency Department (HOSPITAL_COMMUNITY)
Admission: EM | Admit: 2013-05-27 | Discharge: 2013-05-28 | Disposition: A | Discharge status: Home / Self Care | Payer: Medicaid Other | Attending: Emergency Medicine | Admitting: Emergency Medicine

## 2013-05-27 DIAGNOSIS — L02619 Cutaneous abscess of unspecified foot: Secondary | ICD-10-CM | POA: Insufficient documentation

## 2013-05-27 DIAGNOSIS — L03119 Cellulitis of unspecified part of limb: Secondary | ICD-10-CM | POA: Insufficient documentation

## 2013-05-27 DIAGNOSIS — Z8659 Personal history of other mental and behavioral disorders: Secondary | ICD-10-CM | POA: Insufficient documentation

## 2013-05-27 DIAGNOSIS — L0291 Cutaneous abscess, unspecified: Secondary | ICD-10-CM

## 2013-05-27 DIAGNOSIS — Z8619 Personal history of other infectious and parasitic diseases: Secondary | ICD-10-CM | POA: Insufficient documentation

## 2013-05-27 NOTE — ED Provider Notes (Signed)
CSN: [id number]     Arrival date & time 05/27/13  1954 History     None    Chief Complaint  Patient presents with  . Foot Pain   (Consider location/radiation/quality/duration/timing/severity/associated sxs/prior Treatment) HPI History provided by pt.   Pt presents w/ pain on plantar surface of left foot. Level 5 caveat applies d/t mental illness.  Noticed a bubble 2 weeks ago, her husband scraped it with a knife 3-4 days ago and pulled out something black that looked like glass or metal, and the pain has since increased.  Aggravated by bearing weight.  No drainage or surrounding skin changes.  No associated fever.  Does not recall a trauma.  Past Medical History  Diagnosis Date  . Mental disorder   . Bipolar 1 disorder   . Bipolar 1 disorder   . Yeast infection of the vagina   . Bipolar disorder    Past Surgical History  Procedure Laterality Date  . No past surgeries     History reviewed. No pertinent family history. History  Substance Use Topics  . Smoking status: Never Smoker   . Smokeless tobacco: Never Used  . Alcohol Use: No   OB History   Grav Para Term Preterm Abortions TAB SAB Ect Mult Living   1 1 1       1      Review of Systems  All other systems reviewed and are negative.    Allergies  Sulfa antibiotics and Tramadol  Home Medications  No current outpatient prescriptions on file. BP 121/98  Pulse 103  Temp(Src) 98.3 F (36.8 C) (Oral)  Resp 16  SpO2 95%  LMP 05/12/2013 Physical Exam  Nursing note and vitals reviewed. Constitutional: She is oriented to person, place, and time. She appears well-developed and well-nourished. No distress.  HENT:  Head: Normocephalic and atraumatic.  Eyes:  Normal appearance  Neck: Normal range of motion.  Pulmonary/Chest: Effort normal.  Musculoskeletal: Normal range of motion.  Plantar surface of left mid-foot w/ a soft, fluctuant, tender lesion, approx 1.5cm in diameter.  Appears to be a small abscess.  No  surrounding cellulitis.   Neurological: She is alert and oriented to person, place, and time.  Psychiatric: She has a normal mood and affect. Her behavior is normal.  Anxious appearing    ED Course   Procedures (including critical care time) INCISION AND DRAINAGE Performed by: Ruby Cola E Consent: Verbal consent obtained. Risks and benefits: risks, benefits and alternatives were discussed Type: abscess  Body area: plantar surface L foot  Anesthesia: none  Needle aspiration  Comlexity: simple  Drainage: purulent  Drainage amount: moderate  Packing material: none Patient tolerance: Patient tolerated the procedure well with no immediate complications.    Labs Reviewed - No data to display Dg Foot Complete Left  05/27/2013   *RADIOLOGY REPORT*  Clinical Data: Puncture wound, possible foreign body  LEFT FOOT - COMPLETE 3+ VIEW  Comparison: None.  Findings: Normal alignment.  No fracture.  Mild soft tissue swelling on the lateral view.  No radiopaque foreign body. Preserved joint spaces.  IMPRESSION: Soft tissue swelling.   Original Report Authenticated By: Judie Petit. Shick, M.D.   1. Abscess     MDM  [phone] F w/ bipolar disorder presents w/ reportedly non-traumatic lesion of plantar surface of L mid-foot.  Pt extremely anxious.  Appears to have a small abscess w/out surrounding cellulitis.  Visualized w/ bedside US and no obvious subcutaneous fluid collection.  Xray ordered because husband said  he pulled what appeared to be piece of glass out of same location with his knife 3-4 days ago.  No foreign body visualized.  Needle aspiration of abscess performed w/ drainage of mod amt of pus.  Ortho tech provided her with a cam walker, pt received one vicodin and d/c'd home w/ 15 more.  Return precautions discussed.  12:45 AM   Otilio Miu, PA-C 05/28/13 [phone]

## 2013-05-27 NOTE — ED Notes (Signed)
Presents with left plantar foot pain. Small induration/callous to left planter foot. Pain began one week ago.

## 2013-05-28 MED ORDER — HYDROCODONE-ACETAMINOPHEN 5-325 MG PO TABS
1.0000 | ORAL_TABLET | Freq: Once | ORAL | Status: AC
  Administered 2013-05-28: 1 via ORAL
  Filled 2013-05-28: qty 1

## 2013-05-28 MED ORDER — HYDROCODONE-ACETAMINOPHEN 5-325 MG PO TABS: 1.0000 | ORAL_TABLET | ORAL | Status: DC | PRN

## 2013-05-28 NOTE — Progress Notes (Signed)
Orthopedic Tech Progress Note Patient Details:  Jordan [name] [birth year] [id number]  Ortho Devices Type of Ortho Device: CAM walker   Jordan [name] 05/28/2013, 12:53 AM

## 2013-06-01 NOTE — ED Provider Notes (Signed)
Medical screening examination/treatment/procedure(s) were performed by non-physician practitioner and as supervising physician I was immediately available for consultation/collaboration.  Kinaya Hilliker, MD 06/01/13 1629 

## 2013-09-04 ENCOUNTER — Emergency Department (HOSPITAL_COMMUNITY)
Admission: EM | Admit: 2013-09-04 | Discharge: 2013-09-04 | Disposition: A | Discharge status: Home / Self Care | Payer: Medicaid Other | Attending: Emergency Medicine | Admitting: Emergency Medicine

## 2013-09-04 ENCOUNTER — Encounter (HOSPITAL_COMMUNITY): Payer: Self-pay | Admitting: Emergency Medicine

## 2013-09-04 DIAGNOSIS — Z8659 Personal history of other mental and behavioral disorders: Secondary | ICD-10-CM | POA: Insufficient documentation

## 2013-09-04 DIAGNOSIS — L089 Local infection of the skin and subcutaneous tissue, unspecified: Secondary | ICD-10-CM | POA: Insufficient documentation

## 2013-09-04 DIAGNOSIS — E669 Obesity, unspecified: Secondary | ICD-10-CM | POA: Insufficient documentation

## 2013-09-04 DIAGNOSIS — Z8619 Personal history of other infectious and parasitic diseases: Secondary | ICD-10-CM | POA: Insufficient documentation

## 2013-09-04 MED ORDER — CEPHALEXIN 500 MG PO CAPS: 500.0000 mg | ORAL_CAPSULE | Freq: Four times a day (QID) | ORAL | Status: DC

## 2013-09-04 NOTE — ED Provider Notes (Signed)
CSN: [id number]     Arrival date & time 09/04/13  1734 History  This chart was scribed for non-physician practitioner, Teressa Lower, FNP,working with Candyce Churn, MD, by Karle Plumber, ED Scribe.  This patient was seen in room TR11C/TR11C and the patient's care was started at 5:49 PM.  Chief Complaint  Patient presents with  . Abscess   The history is provided by the patient. No language interpreter was used.   HPI Comments:  Jordan [name] is a 47 y.o. female who presents to the Emergency Department complaining of an abscess to the back of her left leg onset one month. Pt states her boyfriend has been trying to manipulate it and trying to open it without success. She denies drainage.   Past Medical History  Diagnosis Date  . Mental disorder   . Bipolar 1 disorder   . Bipolar 1 disorder   . Yeast infection of the vagina   . Bipolar disorder    Past Surgical History  Procedure Laterality Date  . No past surgeries     History reviewed. No pertinent family history. History  Substance Use Topics  . Smoking status: Never Smoker   . Smokeless tobacco: Never Used  . Alcohol Use: No   OB History   Grav Para Term Preterm Abortions TAB SAB Ect Mult Living   1 1 1       1      Review of Systems  Skin:       Abscess to back of left leg.   All other systems reviewed and are negative.    Allergies  Sulfa antibiotics and Tramadol  Home Medications   Current Outpatient Rx  Name  Route  Sig  Dispense  Refill  . HYDROcodone-acetaminophen (NORCO/VICODIN) 5-325 MG per tablet   Oral   Take 1 tablet by mouth every 4 (four) hours as needed for pain.   15 tablet   0    Triage Vitals: BP 116/79  Pulse 83  Temp(Src) 98.5 F (36.9 C) (Oral)  Resp 20  Wt 345 lb 3 oz (156.576 kg)  SpO2 96% Physical Exam  Nursing note and vitals reviewed. Constitutional: She is oriented to person, place, and time. She appears well-developed and well-nourished. No distress.  Obese   HENT:  Head: Normocephalic and atraumatic.  Eyes: Conjunctivae are normal. No scleral icterus.  Neck: Neck supple.  Cardiovascular: Normal rate and intact distal pulses.   Pulmonary/Chest: Effort normal. No stridor. No respiratory distress.  Abdominal: Normal appearance. She exhibits no distension.  Neurological: She is alert and oriented to person, place, and time.  Skin: Skin is warm and dry. No rash noted. There is erythema.  Small, round red area with no fluctuance or firmness with a scab. No drainage noted.  Psychiatric: She has a normal mood and affect. Her behavior is normal.    ED Course  Procedures (including critical care time) DIAGNOSTIC STUDIES: Oxygen Saturation is 96% on RA, adequate by my interpretation.   COORDINATION OF CARE: 5:52 PM- Will give prescription for antibiotic. Pt verbalizes understanding and agrees to plan.  Medications - No data to display  Labs Review Labs Reviewed - No data to display Imaging Review No results found.  EKG Interpretation   None       MDM   1. Skin infection    No definite abscess noted:will place on antibiotics as pt has had boyfriend digging with a needle: and the area is red  I personally performed the services  described in this documentation, which was scribed in my presence. The recorded information has been reviewed and is accurate.   Teressa Lower, NP 09/04/13 1758

## 2013-09-04 NOTE — ED Notes (Signed)
Pt reports an abscess to her posterior Left thigh x7-10 days.

## 2013-09-05 NOTE — ED Provider Notes (Signed)
Medical screening examination/treatment/procedure(s) were performed by non-physician practitioner and as supervising physician I was immediately available for consultation/collaboration.  EKG Interpretation   None         Candyce Churn, MD 09/05/13 1115

## 2013-12-31 ENCOUNTER — Encounter (HOSPITAL_COMMUNITY): Payer: Self-pay | Admitting: Emergency Medicine

## 2013-12-31 ENCOUNTER — Emergency Department (HOSPITAL_COMMUNITY): Payer: Medicaid Other

## 2013-12-31 DIAGNOSIS — N390 Urinary tract infection, site not specified: Secondary | ICD-10-CM | POA: Insufficient documentation

## 2013-12-31 DIAGNOSIS — Z8619 Personal history of other infectious and parasitic diseases: Secondary | ICD-10-CM | POA: Insufficient documentation

## 2013-12-31 DIAGNOSIS — E669 Obesity, unspecified: Secondary | ICD-10-CM | POA: Insufficient documentation

## 2013-12-31 DIAGNOSIS — R059 Cough, unspecified: Secondary | ICD-10-CM | POA: Insufficient documentation

## 2013-12-31 DIAGNOSIS — R350 Frequency of micturition: Secondary | ICD-10-CM | POA: Insufficient documentation

## 2013-12-31 DIAGNOSIS — R110 Nausea: Secondary | ICD-10-CM | POA: Insufficient documentation

## 2013-12-31 DIAGNOSIS — R05 Cough: Secondary | ICD-10-CM | POA: Insufficient documentation

## 2013-12-31 DIAGNOSIS — R1011 Right upper quadrant pain: Secondary | ICD-10-CM | POA: Insufficient documentation

## 2013-12-31 DIAGNOSIS — R12 Heartburn: Secondary | ICD-10-CM | POA: Insufficient documentation

## 2013-12-31 DIAGNOSIS — Z8659 Personal history of other mental and behavioral disorders: Secondary | ICD-10-CM | POA: Insufficient documentation

## 2013-12-31 LAB — COMPREHENSIVE METABOLIC PANEL
ALT: 36 U/L — ABNORMAL HIGH (ref 0–35)
AST: 34 U/L (ref 0–37)
Albumin: 3.8 g/dL (ref 3.5–5.2)
Alkaline Phosphatase: 97 U/L (ref 39–117)
BUN: 9 mg/dL (ref 6–23)
CO2: 23 mEq/L (ref 19–32)
Calcium: 8.6 mg/dL (ref 8.4–10.5)
Chloride: 104 mEq/L (ref 96–112)
Creatinine, Ser: 0.69 mg/dL (ref 0.50–1.10)
GFR calc Af Amer: >90 mL/min (ref >90)
GFR calc non Af Amer: >90 mL/min (ref >90)
Glucose, Bld: 92 mg/dL (ref 70–99)
Potassium: 3.4 mEq/L — ABNORMAL LOW (ref 3.7–5.3)
Sodium: 140 mEq/L (ref 137–147)
Total Bilirubin: 1.1 mg/dL (ref 0.3–1.2)
Total Protein: 6.4 g/dL (ref 6.0–8.3)

## 2013-12-31 LAB — CBC WITH DIFFERENTIAL/PLATELET
Basophils Absolute: 0 10*3/uL (ref 0.0–0.1)
Basophils Relative: 0 % (ref 0–1)
Eosinophils Absolute: 0 10*3/uL (ref 0.0–0.7)
Eosinophils Relative: 0 % (ref 0–5)
HCT: 30.7 % — ABNORMAL LOW (ref 36.0–46.0)
Hemoglobin: 11.1 g/dL — ABNORMAL LOW (ref 12.0–15.0)
Lymphocytes Relative: 32 % (ref 12–46)
Lymphs Abs: 1.4 10*3/uL (ref 0.7–4.0)
MCH: 42 pg — ABNORMAL HIGH (ref 26.0–34.0)
MCHC: 36.2 g/dL — ABNORMAL HIGH (ref 30.0–36.0)
MCV: 116.3 fL — ABNORMAL HIGH (ref 78.0–100.0)
Monocytes Absolute: 0.2 10*3/uL (ref 0.1–1.0)
Monocytes Relative: 4 % (ref 3–12)
Neutro Abs: 2.7 10*3/uL (ref 1.7–7.7)
Neutrophils Relative %: 64 % (ref 43–77)
Platelets: 149 10*3/uL — ABNORMAL LOW (ref 150–400)
RBC: 2.64 MIL/uL — ABNORMAL LOW (ref 3.87–5.11)
RDW: 13.7 % (ref 11.5–15.5)
WBC: 4.3 10*3/uL (ref 4.0–10.5)

## 2013-12-31 LAB — URINALYSIS, ROUTINE W REFLEX MICROSCOPIC
Glucose, UA: NEGATIVE mg/dL
Hgb urine dipstick: NEGATIVE
Ketones, ur: 15 mg/dL — AB
Nitrite: POSITIVE — AB
Protein, ur: NEGATIVE mg/dL
Specific Gravity, Urine: 1.023 (ref 1.005–1.030)
Urobilinogen, UA: 4 mg/dL — ABNORMAL HIGH (ref 0.0–1.0)
pH: 6 (ref 5.0–8.0)

## 2013-12-31 LAB — URINE MICROSCOPIC-ADD ON

## 2013-12-31 LAB — LIPASE, BLOOD: Lipase: 20 U/L (ref 11–59)

## 2013-12-31 NOTE — ED Notes (Signed)
Wait time given 

## 2013-12-31 NOTE — ED Notes (Signed)
Pt. reports persistent exertional dyspnea  for several weeks with occasional dry cough and nausea , pt. also stated generalized acid reflux for several days .

## 2014-01-01 ENCOUNTER — Emergency Department (HOSPITAL_COMMUNITY)
Admission: EM | Admit: 2014-01-01 | Discharge: 2014-01-01 | Disposition: A | Discharge status: Home / Self Care | Payer: Medicaid Other | Attending: Emergency Medicine | Admitting: Emergency Medicine

## 2014-01-01 ENCOUNTER — Encounter (HOSPITAL_COMMUNITY): Payer: Self-pay | Admitting: Emergency Medicine

## 2014-01-01 ENCOUNTER — Emergency Department (HOSPITAL_COMMUNITY): Payer: Medicaid Other

## 2014-01-01 DIAGNOSIS — N390 Urinary tract infection, site not specified: Secondary | ICD-10-CM

## 2014-01-01 DIAGNOSIS — R059 Cough, unspecified: Secondary | ICD-10-CM

## 2014-01-01 DIAGNOSIS — R05 Cough: Secondary | ICD-10-CM

## 2014-01-01 HISTORY — DX: Obesity, unspecified: E66.9

## 2014-01-01 MED ORDER — CEPHALEXIN 500 MG PO CAPS: 500.0000 mg | ORAL_CAPSULE | Freq: Four times a day (QID) | ORAL | Status: DC

## 2014-01-01 MED ORDER — HYDROCODONE-ACETAMINOPHEN 5-325 MG PO TABS
2.0000 | ORAL_TABLET | Freq: Once | ORAL | Status: AC
  Administered 2014-01-01: 2 via ORAL
  Filled 2014-01-01: qty 2

## 2014-01-01 MED ORDER — ONDANSETRON 4 MG PO TBDP
8.0000 mg | ORAL_TABLET | Freq: Once | ORAL | Status: AC
  Administered 2014-01-01: 8 mg via ORAL
  Filled 2014-01-01: qty 2

## 2014-01-01 NOTE — ED Provider Notes (Signed)
CSN: [id number]     Arrival date & time 12/31/13  2039 History   First MD Initiated Contact with Patient 01/01/14 [phone]     Chief Complaint  Patient presents with  . Shortness of Breath   HPI  History provided by the patient. The patient is a 48 year old female with history of bipolar disorder, obesity who presents with complaints of continued cough and shortness of breath feeling. She also complains of some heartburn and lower abdomen burning. Symptoms have been present for the past week or more. She has not been using any medications to help with the symptoms. They're associated with slight nausea. Denies any recent travel. No known sick contacts. Denies any associated fever, chills or sweats. No diarrhea or constipation. She denies any urinary frequency, dysuria or hematuria.    Past Medical History  Diagnosis Date  . Mental disorder   . Bipolar 1 disorder   . Bipolar 1 disorder   . Yeast infection of the vagina   . Bipolar disorder   . Obesity    Past Surgical History  Procedure Laterality Date  . No past surgeries    . Cholecystectomy     No family history on file. History  Substance Use Topics  . Smoking status: Never Smoker   . Smokeless tobacco: Never Used  . Alcohol Use: No   OB History   Grav Para Term Preterm Abortions TAB SAB Ect Mult Living   1 1 1       1      Review of Systems  Constitutional: Positive for chills and appetite change. Negative for fever.  Respiratory: Positive for shortness of breath. Negative for cough.   Cardiovascular: Negative for chest pain.  Gastrointestinal: Positive for nausea and abdominal pain. Negative for vomiting, diarrhea, constipation and blood in stool.  Genitourinary: Positive for frequency. Negative for dysuria, hematuria, flank pain, vaginal bleeding and vaginal discharge.  All other systems reviewed and are negative.      Allergies  Sulfa antibiotics and Tramadol  Home Medications   Current Outpatient Rx  Name   Route  Sig  Dispense  Refill  . HYDROcodone-acetaminophen (NORCO/VICODIN) 5-325 MG per tablet   Oral   Take 1 tablet by mouth every 4 (four) hours as needed for pain.   15 tablet   0    BP 102/60  Pulse 94  Temp(Src) 98.7 F (37.1 C) (Oral)  Resp 14  SpO2 99% Physical Exam  Nursing note and vitals reviewed. Constitutional: She is oriented to person, place, and time. She appears well-developed and well-nourished. No distress.  HENT:  Head: Normocephalic.  Cardiovascular: Normal rate and regular rhythm.   Pulmonary/Chest: Effort normal and breath sounds normal. No respiratory distress. She has no wheezes. She has no rales.  Abdominal: Soft. Bowel sounds are normal. She exhibits no distension. There is tenderness in the right upper quadrant and suprapubic area. There is positive Murphy's sign. There is no rigidity, no rebound, no guarding, no CVA tenderness and no tenderness at McBurney's point.  Obese.  Musculoskeletal: Normal range of motion.  Neurological: She is alert and oriented to person, place, and time.  Skin: Skin is warm and dry. No rash noted.  Psychiatric: She has a normal mood and affect. Her behavior is normal.    ED Course  Procedures   DIAGNOSTIC STUDIES: Oxygen Saturation is 99 on RA.    COORDINATION OF CARE:  Nursing notes reviewed. Vital signs reviewed. Initial pt interview and examination performed.   12:51  AM-patient seen and evaluated. She appears well with no respirations and O2 sats. The lungs are clear. Chest x-ray reviewed without any signs of pneumonia or other concerning cause of her cough and shortness of breath. Patient does have tenderness in the right upper quadrant. Lab test to show signs of significant UTI. This may describe her suprapubic pain and burning.   At this time we'll give a prescription for Keflex for UTI and recommend over-the-counter cough and cold medicine for her cough symptoms. Patient advised to followup with PCP and  agrees.  Treatment plan initiated: Medications  ondansetron (ZOFRAN-ODT) disintegrating tablet 8 mg (not administered)  HYDROcodone-acetaminophen (NORCO/VICODIN) 5-325 MG per tablet 2 tablet (not administered)    Results for orders placed during the hospital encounter of 01/01/14  CBC WITH DIFFERENTIAL      Result Value Ref Range   WBC 4.3  4.0 - 10.5 K/uL   RBC 2.64 (*) 3.87 - 5.11 MIL/uL   Hemoglobin 11.1 (*) 12.0 - 15.0 g/dL   HCT 30.7 (*) 36.0 - 46.0 %   MCV 116.3 (*) 78.0 - 100.0 fL   MCH 42.0 (*) 26.0 - 34.0 pg   MCHC 36.2 (*) 30.0 - 36.0 g/dL   RDW 13.7  11.5 - 15.5 %   Platelets 149 (*) 150 - 400 K/uL   Neutrophils Relative % 64  43 - 77 %   Lymphocytes Relative 32  12 - 46 %   Monocytes Relative 4  3 - 12 %   Eosinophils Relative 0  0 - 5 %   Basophils Relative 0  0 - 1 %   Neutro Abs 2.7  1.7 - 7.7 K/uL   Lymphs Abs 1.4  0.7 - 4.0 K/uL   Monocytes Absolute 0.2  0.1 - 1.0 K/uL   Eosinophils Absolute 0.0  0.0 - 0.7 K/uL   Basophils Absolute 0.0  0.0 - 0.1 K/uL   RBC Morphology POLYCHROMASIA PRESENT     Smear Review PLATELET CLUMPS NOTED ON SMEAR    COMPREHENSIVE METABOLIC PANEL      Result Value Ref Range   Sodium 140  137 - 147 mEq/L   Potassium 3.4 (*) 3.7 - 5.3 mEq/L   Chloride 104  96 - 112 mEq/L   CO2 23  19 - 32 mEq/L   Glucose, Bld 92  70 - 99 mg/dL   BUN 9  6 - 23 mg/dL   Creatinine, Ser 0.69  0.50 - 1.10 mg/dL   Calcium 8.6  8.4 - 10.5 mg/dL   Total Protein 6.4  6.0 - 8.3 g/dL   Albumin 3.8  3.5 - 5.2 g/dL   AST 34  0 - 37 U/L   ALT 36 (*) 0 - 35 U/L   Alkaline Phosphatase 97  39 - 117 U/L   Total Bilirubin 1.1  0.3 - 1.2 mg/dL   GFR calc non Af Amer >90  >90 mL/min   GFR calc Af Amer >90  >90 mL/min  LIPASE, BLOOD      Result Value Ref Range   Lipase 20  11 - 59 U/L  URINALYSIS, ROUTINE W REFLEX MICROSCOPIC      Result Value Ref Range   Color, Urine AMBER (*) YELLOW   APPearance TURBID (*) CLEAR   Specific Gravity, Urine 1.023  1.005 - 1.030    pH 6.0  5.0 - 8.0   Glucose, UA NEGATIVE  NEGATIVE mg/dL   Hgb urine dipstick NEGATIVE  NEGATIVE   Bilirubin Urine SMALL (*)  NEGATIVE   Ketones, ur 15 (*) NEGATIVE mg/dL   Protein, ur NEGATIVE  NEGATIVE mg/dL   Urobilinogen, UA 4.0 (*) 0.0 - 1.0 mg/dL   Nitrite POSITIVE (*) NEGATIVE   Leukocytes, UA LARGE (*) NEGATIVE  URINE MICROSCOPIC-ADD ON      Result Value Ref Range   Squamous Epithelial / LPF MANY (*) RARE   WBC, UA 21-50  <3 WBC/hpf   Bacteria, UA MANY (*) RARE   Urine-Other MUCOUS PRESENT        Imaging Review Dg Chest 2 View  12/31/2013   CLINICAL DATA:  Shortness of Breath  EXAM: CHEST  2 VIEW  COMPARISON:  May 02, 2012  FINDINGS: There is minimal atelectatic change in the right base. Lungs elsewhere are clear. Heart size and pulmonary vascularity are normal. No adenopathy. No bone lesions.  IMPRESSION: Mild right base atelectasis.  No edema or consolidation.   Electronically Signed   By: Lowella Grip M.D.   On: 12/31/2013 21:42   US Abdomen Limited  01/01/2014   CLINICAL DATA:  Shortness of breath. Clinical concern for gallstones. Previous cholecystectomy.  EXAM: US ABDOMEN LIMITED - RIGHT UPPER QUADRANT  COMPARISON:  Abdomen CT dated 10/31/2010.  FINDINGS: Gallbladder:  Surgically absent.  Common bile duct:  Diameter: 2.2 mm  Liver:  Diffusely echogenic and mildly enlarged.  IMPRESSION: 1. Diffusely echogenic liver, most likely due to hepatic steatosis. Cirrhosis could also produce this appearance. 2. Status post cholecystectomy.   Electronically Signed   By: Enrique Sack M.D.   On: 01/01/2014 02:46     MDM   Final diagnoses:  UTI (lower urinary tract infection)  Cough       Martie Lee, PA-C 01/01/14 2222

## 2014-01-01 NOTE — Discharge Instructions (Signed)
Please follow up with a primary care provider for continued evaluation and treatment of your infection and symptoms.     Urinary Tract Infection Urinary tract infections (UTIs) can develop anywhere along your urinary tract. Your urinary tract is your body's drainage system for removing wastes and extra water. Your urinary tract includes two kidneys, two ureters, a bladder, and a urethra. Your kidneys are a pair of bean-shaped organs. Each kidney is about the size of your fist. They are located below your ribs, one on each side of your spine. CAUSES Infections are caused by microbes, which are microscopic organisms, including fungi, viruses, and bacteria. These organisms are so small that they can only be seen through a microscope. Bacteria are the microbes that most commonly cause UTIs. SYMPTOMS  Symptoms of UTIs may vary by age and gender of the patient and by the location of the infection. Symptoms in young women typically include a frequent and intense urge to urinate and a painful, burning feeling in the bladder or urethra during urination. Older women and men are more likely to be tired, shaky, and weak and have muscle aches and abdominal pain. A fever may mean the infection is in your kidneys. Other symptoms of a kidney infection include pain in your back or sides below the ribs, nausea, and vomiting. DIAGNOSIS To diagnose a UTI, your caregiver will ask you about your symptoms. Your caregiver also will ask to provide a urine sample. The urine sample will be tested for bacteria and white blood cells. White blood cells are made by your body to help fight infection. TREATMENT  Typically, UTIs can be treated with medication. Because most UTIs are caused by a bacterial infection, they usually can be treated with the use of antibiotics. The choice of antibiotic and length of treatment depend on your symptoms and the type of bacteria causing your infection. HOME CARE INSTRUCTIONS  If you were  prescribed antibiotics, take them exactly as your caregiver instructs you. Finish the medication even if you feel better after you have only taken some of the medication.  Drink enough water and fluids to keep your urine clear or pale yellow.  Avoid caffeine, tea, and carbonated beverages. They tend to irritate your bladder.  Empty your bladder often. Avoid holding urine for long periods of time.  Empty your bladder before and after sexual intercourse.  After a bowel movement, women should cleanse from front to back. Use each tissue only once. SEEK MEDICAL CARE IF:   You have back pain.  You develop a fever.  Your symptoms do not begin to resolve within 3 days. SEEK IMMEDIATE MEDICAL CARE IF:   You have severe back pain or lower abdominal pain.  You develop chills.  You have nausea or vomiting.  You have continued burning or discomfort with urination. MAKE SURE YOU:   Understand these instructions.  Will watch your condition.  Will get help right away if you are not doing well or get worse. Document Released: 07/17/2005 Document Revised: 04/07/2012 Document Reviewed: 11/15/2011 [facility] Patient Information 2014 [place].

## 2014-01-02 NOTE — ED Provider Notes (Signed)
Medical screening examination/treatment/procedure(s) were performed by non-physician practitioner and as supervising physician I was immediately available for consultation/collaboration.   Liberti Appleton, MD 01/02/14 0427 

## 2014-05-10 ENCOUNTER — Emergency Department (HOSPITAL_COMMUNITY)
Admission: EM | Admit: 2014-05-10 | Discharge: 2014-05-10 | Disposition: A | Discharge status: Home / Self Care | Payer: Medicaid Other | Attending: Emergency Medicine | Admitting: Emergency Medicine

## 2014-05-10 ENCOUNTER — Encounter (HOSPITAL_COMMUNITY): Payer: Self-pay | Admitting: Emergency Medicine

## 2014-05-10 ENCOUNTER — Emergency Department (HOSPITAL_COMMUNITY): Payer: Medicaid Other

## 2014-05-10 DIAGNOSIS — R071 Chest pain on breathing: Secondary | ICD-10-CM | POA: Insufficient documentation

## 2014-05-10 DIAGNOSIS — IMO0002 Reserved for concepts with insufficient information to code with codable children: Secondary | ICD-10-CM | POA: Diagnosis not present

## 2014-05-10 DIAGNOSIS — J159 Unspecified bacterial pneumonia: Secondary | ICD-10-CM | POA: Diagnosis not present

## 2014-05-10 DIAGNOSIS — E669 Obesity, unspecified: Secondary | ICD-10-CM | POA: Diagnosis not present

## 2014-05-10 DIAGNOSIS — J189 Pneumonia, unspecified organism: Secondary | ICD-10-CM

## 2014-05-10 DIAGNOSIS — F319 Bipolar disorder, unspecified: Secondary | ICD-10-CM | POA: Diagnosis not present

## 2014-05-10 DIAGNOSIS — Z79899 Other long term (current) drug therapy: Secondary | ICD-10-CM | POA: Diagnosis not present

## 2014-05-10 DIAGNOSIS — Z8619 Personal history of other infectious and parasitic diseases: Secondary | ICD-10-CM | POA: Insufficient documentation

## 2014-05-10 DIAGNOSIS — N390 Urinary tract infection, site not specified: Secondary | ICD-10-CM | POA: Insufficient documentation

## 2014-05-10 DIAGNOSIS — R0602 Shortness of breath: Secondary | ICD-10-CM | POA: Diagnosis present

## 2014-05-10 LAB — BASIC METABOLIC PANEL
Anion gap: 16 — ABNORMAL HIGH (ref 5–15)
BUN: 11 mg/dL (ref 6–23)
CO2: 24 mEq/L (ref 19–32)
Calcium: 8.8 mg/dL (ref 8.4–10.5)
Chloride: 98 mEq/L (ref 96–112)
Creatinine, Ser: 0.72 mg/dL (ref 0.50–1.10)
GFR calc Af Amer: >90 mL/min (ref >90)
GFR calc non Af Amer: >90 mL/min (ref >90)
Glucose, Bld: 116 mg/dL — ABNORMAL HIGH (ref 70–99)
Potassium: 4.2 mEq/L (ref 3.7–5.3)
Sodium: 138 mEq/L (ref 137–147)

## 2014-05-10 LAB — PRO B NATRIURETIC PEPTIDE: Pro B Natriuretic peptide (BNP): 173.6 pg/mL — ABNORMAL HIGH (ref 0–125)

## 2014-05-10 LAB — URINE MICROSCOPIC-ADD ON

## 2014-05-10 LAB — URINALYSIS, ROUTINE W REFLEX MICROSCOPIC
Bilirubin Urine: NEGATIVE
Glucose, UA: NEGATIVE mg/dL
Hgb urine dipstick: NEGATIVE
Ketones, ur: NEGATIVE mg/dL
Nitrite: POSITIVE — AB
Protein, ur: NEGATIVE mg/dL
Specific Gravity, Urine: 1.018 (ref 1.005–1.030)
Urobilinogen, UA: 1 mg/dL (ref 0.0–1.0)
pH: 6 (ref 5.0–8.0)

## 2014-05-10 LAB — CBC
HCT: 35.2 % — ABNORMAL LOW (ref 36.0–46.0)
Hemoglobin: 12.2 g/dL (ref 12.0–15.0)
MCH: 40.4 pg — ABNORMAL HIGH (ref 26.0–34.0)
MCHC: 34.7 g/dL (ref 30.0–36.0)
MCV: 116.6 fL — ABNORMAL HIGH (ref 78.0–100.0)
Platelets: 188 10*3/uL (ref 150–400)
RBC: 3.02 MIL/uL — ABNORMAL LOW (ref 3.87–5.11)
RDW: 13.9 % (ref 11.5–15.5)
WBC: 9.3 10*3/uL (ref 4.0–10.5)

## 2014-05-10 LAB — I-STAT TROPONIN, ED: Troponin i, poc: 0 ng/mL (ref 0.00–0.08)

## 2014-05-10 MED ORDER — ONDANSETRON HCL 4 MG/2ML IJ SOLN: 4.0000 mg | Freq: Once | INTRAMUSCULAR | Status: DC

## 2014-05-10 MED ORDER — HYDROCODONE-ACETAMINOPHEN 5-325 MG PO TABS: 1.0000 | ORAL_TABLET | ORAL | Status: DC | PRN

## 2014-05-10 MED ORDER — ONDANSETRON 4 MG PO TBDP: 4.0000 mg | ORAL_TABLET | Freq: Three times a day (TID) | ORAL | Status: DC | PRN

## 2014-05-10 MED ORDER — LEVOFLOXACIN 750 MG PO TABS
750.0000 mg | ORAL_TABLET | Freq: Once | ORAL | Status: AC
  Administered 2014-05-10: 750 mg via ORAL
  Filled 2014-05-10: qty 1

## 2014-05-10 MED ORDER — KETOROLAC TROMETHAMINE 30 MG/ML IJ SOLN: 30.0000 mg | Freq: Once | INTRAMUSCULAR | Status: DC

## 2014-05-10 MED ORDER — KETOROLAC TROMETHAMINE 60 MG/2ML IM SOLN
60.0000 mg | Freq: Once | INTRAMUSCULAR | Status: AC
  Administered 2014-05-10: 60 mg via INTRAMUSCULAR
  Filled 2014-05-10: qty 2

## 2014-05-10 MED ORDER — ASPIRIN 325 MG PO TABS
325.0000 mg | ORAL_TABLET | ORAL | Status: AC
  Administered 2014-05-10: 325 mg via ORAL
  Filled 2014-05-10: qty 1

## 2014-05-10 MED ORDER — LEVOFLOXACIN 500 MG PO TABS: 500.0000 mg | ORAL_TABLET | Freq: Every day | ORAL | Status: DC

## 2014-05-10 MED ORDER — ONDANSETRON 4 MG PO TBDP
4.0000 mg | ORAL_TABLET | Freq: Once | ORAL | Status: AC
  Administered 2014-05-10: 4 mg via ORAL
  Filled 2014-05-10: qty 1

## 2014-05-10 MED ORDER — NITROGLYCERIN 0.4 MG SL SUBL: 0.4000 mg | SUBLINGUAL_TABLET | SUBLINGUAL | Status: DC | PRN

## 2014-05-10 NOTE — ED Notes (Signed)
IV Team unsuccessful in IV attempt.  2nd IV Team person paged for IV access.

## 2014-05-10 NOTE — Discharge Instructions (Signed)
Pneumonia, Adult °Pneumonia is an infection of the lungs.  °CAUSES °Pneumonia may be caused by bacteria or a virus. Usually, these infections are caused by breathing infectious particles into the lungs (respiratory tract). °SYMPTOMS  °· Cough. °· Fever. °· Chest pain. °· Increased rate of breathing. °· Wheezing. °· Mucus production. °DIAGNOSIS  °If you have the common symptoms of pneumonia, your caregiver will typically confirm the diagnosis with a chest X-ray. The X-ray will show an abnormality in the lung (pulmonary infiltrate) if you have pneumonia. Other tests of your blood, urine, or sputum may be done to find the specific cause of your pneumonia. Your caregiver may also do tests (blood gases or pulse oximetry) to see how well your lungs are working. °TREATMENT  °Some forms of pneumonia may be spread to other people when you cough or sneeze. You may be asked to wear a mask before and during your exam. Pneumonia that is caused by bacteria is treated with antibiotic medicine. Pneumonia that is caused by the influenza virus may be treated with an antiviral medicine. Most other viral infections must run their course. These infections will not respond to antibiotics.  °PREVENTION °A pneumococcal shot (vaccine) is available to prevent a common bacterial cause of pneumonia. This is usually suggested for: °· People over 65 years old. °· Patients on chemotherapy. °· People with chronic lung problems, such as bronchitis or emphysema. °· People with immune system problems. °If you are over 65 or have a high risk condition, you may receive the pneumococcal vaccine if you have not received it before. In some countries, a routine influenza vaccine is also recommended. This vaccine can help prevent some cases of pneumonia. You may be offered the influenza vaccine as part of your care. °If you smoke, it is time to quit. You may receive instructions on how to stop smoking. Your caregiver can provide medicines and counseling to  help you quit. °HOME CARE INSTRUCTIONS  °· Cough suppressants may be used if you are losing too much rest. However, coughing protects you by clearing your lungs. You should avoid using cough suppressants if you can. °· Your caregiver may have prescribed medicine if he or she thinks your pneumonia is caused by a bacteria or influenza. Finish your medicine even if you start to feel better. °· Your caregiver may also prescribe an expectorant. This loosens the mucus to be coughed up. °· Only take over-the-counter or prescription medicines for pain, discomfort, or fever as directed by your caregiver. °· Do not smoke. Smoking is a common cause of bronchitis and can contribute to pneumonia. If you are a smoker and continue to smoke, your cough may last several weeks after your pneumonia has cleared. °· A cold steam vaporizer or humidifier in your room or home may help loosen mucus. °· Coughing is often worse at night. Sleeping in a semi-upright position in a recliner or using a couple pillows under your head will help with this. °· Get rest as you feel it is needed. Your body will usually let you know when you need to rest. °SEEK IMMEDIATE MEDICAL CARE IF:  °· Your illness becomes worse. This is especially true if you are elderly or weakened from any other disease. °· You cannot control your cough with suppressants and are losing sleep. °· You begin coughing up blood. °· You develop pain which is getting worse or is uncontrolled with medicines. °· You have a fever. °· Any of the symptoms which initially brought you in for treatment   are getting worse rather than better.  You develop shortness of breath or chest pain. MAKE SURE YOU:   Understand these instructions.  Will watch your condition.  Will get help right away if you are not doing well or get worse. Document Released: 10/07/2005 Document Revised: 12/30/2011 Document Reviewed: 12/27/2010 [facility] Patient Information 2015 [place], [place]. This information is  not intended to replace advice given to you by your health care provider. Make sure you discuss any questions you have with your health care provider.  Urinary Tract Infection Urinary tract infections (UTIs) can develop anywhere along your urinary tract. Your urinary tract is your body's drainage system for removing wastes and extra water. Your urinary tract includes two kidneys, two ureters, a bladder, and a urethra. Your kidneys are a pair of bean-shaped organs. Each kidney is about the size of your fist. They are located below your ribs, one on each side of your spine. CAUSES Infections are caused by microbes, which are microscopic organisms, including fungi, viruses, and bacteria. These organisms are so small that they can only be seen through a microscope. Bacteria are the microbes that most commonly cause UTIs. SYMPTOMS  Symptoms of UTIs may vary by age and gender of the patient and by the location of the infection. Symptoms in young women typically include a frequent and intense urge to urinate and a painful, burning feeling in the bladder or urethra during urination. Older women and men are more likely to be tired, shaky, and weak and have muscle aches and abdominal pain. A fever may mean the infection is in your kidneys. Other symptoms of a kidney infection include pain in your back or sides below the ribs, nausea, and vomiting. DIAGNOSIS To diagnose a UTI, your caregiver will ask you about your symptoms. Your caregiver also will ask to provide a urine sample. The urine sample will be tested for bacteria and white blood cells. White blood cells are made by your body to help fight infection. TREATMENT  Typically, UTIs can be treated with medication. Because most UTIs are caused by a bacterial infection, they usually can be treated with the use of antibiotics. The choice of antibiotic and length of treatment depend on your symptoms and the type of bacteria causing your infection. HOME CARE  INSTRUCTIONS  If you were prescribed antibiotics, take them exactly as your caregiver instructs you. Finish the medication even if you feel better after you have only taken some of the medication.  Drink enough water and fluids to keep your urine clear or pale yellow.  Avoid caffeine, tea, and carbonated beverages. They tend to irritate your bladder.  Empty your bladder often. Avoid holding urine for long periods of time.  Empty your bladder before and after sexual intercourse.  After a bowel movement, women should cleanse from front to back. Use each tissue only once. SEEK MEDICAL CARE IF:   You have back pain.  You develop a fever.  Your symptoms do not begin to resolve within 3 days. SEEK IMMEDIATE MEDICAL CARE IF:   You have severe back pain or lower abdominal pain.  You develop chills.  You have nausea or vomiting.  You have continued burning or discomfort with urination. MAKE SURE YOU:   Understand these instructions.  Will watch your condition.  Will get help right away if you are not doing well or get worse. Document Released: 07/17/2005 Document Revised: 04/07/2012 Document Reviewed: 11/15/2011 [facility] Patient Information 2015 [place], [place]. This information is not intended to replace  advice given to you by your health care provider. Make sure you discuss any questions you have with your health care provider.

## 2014-05-10 NOTE — ED Notes (Signed)
IV Team phoned and responded to call for IV start.

## 2014-05-10 NOTE — ED Notes (Signed)
Pt alert and oriented at discharge.  Pt provided a wheelchair at discharge to the waiting room.  Pt verbalized understanding of discharge instructions.  Family to drive pt home.

## 2014-05-10 NOTE — ED Notes (Signed)
Sob and Right sided cp hurts with palpation suddenly this am, talking last night. 12 lead unremarkable, NSR.  92% ra, 100% NRB no cardiovascular history.  Also c/o nausea.  Denies recent surgeries, long travel.  118/72, 97/58, 90 bpm, 100% NRB,

## 2014-05-10 NOTE — ED Notes (Signed)
DR. Jeneen Rinks at bedside.    IV Team at bedside.

## 2014-05-11 NOTE — ED Provider Notes (Signed)
CSN: [id number]     Arrival date & time 05/10/14  0957 History   First MD Initiated Contact with Patient 05/10/14 1040     Chief Complaint  Patient presents with  . Shortness of Breath  . Chest Pain      HPI  Patient presents with c/o right anterior, inferior pleuretic chest pain, and cough.  Sx for 2 days, worse today. No fever, but + chills. Mild nausea.  No V/D. No PE risks. No cardiac history.   Past Medical History  Diagnosis Date  . Mental disorder   . Bipolar 1 disorder   . Bipolar 1 disorder   . Yeast infection of the vagina   . Bipolar disorder   . Obesity    Past Surgical History  Procedure Laterality Date  . No past surgeries    . Cholecystectomy     No family history on file. History  Substance Use Topics  . Smoking status: Never Smoker   . Smokeless tobacco: Never Used  . Alcohol Use: No   OB History   Grav Para Term Preterm Abortions TAB SAB Ect Mult Living   1 1 1       1      Review of Systems  Constitutional: Negative for fever, chills, diaphoresis, appetite change and fatigue.  HENT: Negative for mouth sores, sore throat and trouble swallowing.   Eyes: Negative for visual disturbance.  Respiratory: Positive for cough. Negative for chest tightness, shortness of breath and wheezing.   Cardiovascular: Positive for chest pain.  Gastrointestinal: Negative for nausea, vomiting, abdominal pain, diarrhea and abdominal distention.  Endocrine: Negative for polydipsia, polyphagia and polyuria.  Genitourinary: Negative for dysuria, frequency and hematuria.  Musculoskeletal: Negative for gait problem.  Skin: Negative for color change, pallor and rash.  Neurological: Negative for dizziness, syncope, light-headedness and headaches.  Hematological: Does not bruise/bleed easily.  Psychiatric/Behavioral: Negative for behavioral problems and confusion.      Allergies  Sulfa antibiotics and Tramadol  Home Medications   Prior to Admission medications    Medication Sig Start Date End Date Taking? Authorizing Provider  citalopram (CELEXA) 20 MG tablet Take 20 mg by mouth daily.   Yes Historical Provider, MD  lamoTRIgine (LAMICTAL) 25 MG tablet Take 50 mg by mouth daily.   Yes Historical Provider, MD  triamcinolone ointment (KENALOG) 0.1 % Apply 1 application topically 2 (two) times daily. 04/20/14  Yes Historical Provider, MD  HYDROcodone-acetaminophen (NORCO/VICODIN) 5-325 MG per tablet Take 1 tablet by mouth every 4 (four) hours as needed. 05/10/14   Tanna Furry, MD  levofloxacin (LEVAQUIN) 500 MG tablet Take 1 tablet (500 mg total) by mouth daily. 05/10/14   Tanna Furry, MD  ondansetron (ZOFRAN ODT) 4 MG disintegrating tablet Take 1 tablet (4 mg total) by mouth every 8 (eight) hours as needed for nausea. 05/10/14   Tanna Furry, MD   BP 93/65  Pulse 97  Temp(Src) 99 F (37.2 C) (Oral)  Resp 24  Ht 5\' 8"  (1.727 m)  Wt 350 lb (158.759 kg)  BMI 53.23 kg/m2  SpO2 100% Physical Exam  Constitutional: She is oriented to person, place, and time. She appears well-developed and well-nourished. No distress.  No acute distress  HENT:  Head: Normocephalic.  Eyes: Conjunctivae are normal. Pupils are equal, round, and reactive to light. No scleral icterus.  Neck: Normal range of motion. Neck supple. No thyromegaly present.  Cardiovascular: Normal rate and regular rhythm.  Exam reveals no gallop and no friction rub.  No murmur heard. Pulmonary/Chest: Effort normal and breath sounds normal. No respiratory distress. She has no wheezes. She has no rales.  Abdominal: Soft. Bowel sounds are normal. She exhibits no distension. There is no tenderness. There is no rebound.  Musculoskeletal: Normal range of motion.  Neurological: She is alert and oriented to person, place, and time.  Skin: Skin is warm and dry. No rash noted.  Psychiatric: She has a normal mood and affect. Her behavior is normal.    ED Course  Procedures (including critical care time) Labs  Review Labs Reviewed  CBC - Abnormal; Notable for the following:    RBC 3.02 (*)    HCT 35.2 (*)    MCV 116.6 (*)    MCH 40.4 (*)    All other components within normal limits  BASIC METABOLIC PANEL - Abnormal; Notable for the following:    Glucose, Bld 116 (*)    Anion gap 16 (*)    All other components within normal limits  PRO B NATRIURETIC PEPTIDE - Abnormal; Notable for the following:    Pro B Natriuretic peptide (BNP) 173.6 (*)    All other components within normal limits  URINALYSIS, ROUTINE W REFLEX MICROSCOPIC - Abnormal; Notable for the following:    APPearance CLOUDY (*)    Nitrite POSITIVE (*)    Leukocytes, UA SMALL (*)    All other components within normal limits  URINE MICROSCOPIC-ADD ON - Abnormal; Notable for the following:    Squamous Epithelial / LPF FEW (*)    Bacteria, UA MANY (*)    All other components within normal limits  URINE CULTURE  I-STAT TROPOININ, ED    Imaging Review Dg Chest Port 1 View  05/10/2014   CLINICAL DATA:  Shortness of breath, chest pain  EXAM: PORTABLE CHEST - 1 VIEW  COMPARISON:  12/31/2013  FINDINGS: There is right lower lobe airspace disease. There is no other focal parenchymal opacity. There is no pleural effusion or pneumothorax. The heart and mediastinal contours are unremarkable.  The osseous structures are unremarkable.  IMPRESSION: Right lower lobe pneumonia. Follow up radiography following completion of adequate medical therapy is recommended.   Electronically Signed   By: Kathreen Devoid   On: 05/10/2014 10:26     EKG Interpretation None      MDM   Final diagnoses:  Community acquired pneumonia  UTI (lower urinary tract infection)   Patient was feeling improved. Still complaining of some pleuritic pain. Blood pressure 120/85. Pulse ox 97 on room air. Given by mouth Levaquin in the emergency room. Discharged home. Naproxen, Zofran, Levaquin, Vicodin avoid activity. Rest. Return to emergency with acute changes or any  worsening.    Tanna Furry, MD 05/11/14 1000

## 2014-05-13 LAB — URINE CULTURE: Colony Count: >=100000

## 2014-05-14 ENCOUNTER — Telehealth (HOSPITAL_BASED_OUTPATIENT_CLINIC_OR_DEPARTMENT_OTHER): Payer: Self-pay | Admitting: Emergency Medicine

## 2014-05-14 NOTE — Telephone Encounter (Signed)
Post ED Visit - Positive Culture Follow-up: Successful Patient Follow-Up  Culture assessed and recommendations reviewed by: []  [place], Pharm.D., BCPS []  Heide Guile, Pharm.D., BCPS []  Alycia Rossetti, Pharm.D., BCPS []  [place], Pharm.D., BCPS, AAHIVP [x]  Legrand Como, Pharm.D., BCPS, AAHIVP  Positive urine culture  []  Patient discharged without antimicrobial prescription and treatment is now indicated [x]  Organism is intermediate to prescribed ED discharge antimicrobial []  Patient with positive blood cultures  Changes discussed with ED provider: Baron Sane PA-C New antibiotic prescription: If symptomatic, Macrobid 100 mg PO BID x 7 days    [place], Rex Kras 05/14/2014, 12:06 PM

## 2014-05-14 NOTE — Progress Notes (Signed)
ED Antimicrobial Stewardship Positive Culture Follow Up   Jordan [name] is an 48 y.o. female who presented to [facility] on 05/10/2014 with a chief complaint of  Chief Complaint  Patient presents with  . Shortness of Breath  . Chest Pain    Recent Results (from the past 720 hour(s))  URINE CULTURE     Status: None   Collection Time    05/10/14  1:12 PM      Result Value Ref Range Status   Specimen Description URINE, RANDOM   Final   Special Requests NONE   Final   Culture  Setup Time     Final   Value: 05/10/2014 17:16     Performed at [facility] Count     Final   Value: >=100,000 COLONIES/ML     Performed at [facility]   Culture     Final   Value: ESCHERICHIA COLI     KLEBSIELLA PNEUMONIAE     Note: Confirmed Extended Spectrum Beta-Lactamase Producer (ESBL)     Performed at [facility]   Report Status 05/13/2014 FINAL   Final   Organism ID, Bacteria ESCHERICHIA COLI   Final   Organism ID, Bacteria KLEBSIELLA PNEUMONIAE   Final    [x]  Treated with Levaquin, organism resistant to prescribed antimicrobial  Flow manager will contact patient for a symptom check. If she has urinary symptoms, start Macrobid 100mg  PO BIC x 7 days.  She should also finish her course of Levaquin for pneumonia.  ED Provider: Baron Sane, PA-C   Norva Riffle 05/14/2014, 11:01 AM Infectious Diseases Pharmacist Phone# [phone]

## 2014-05-16 ENCOUNTER — Emergency Department (HOSPITAL_COMMUNITY)
Admission: EM | Admit: 2014-05-16 | Discharge: 2014-05-16 | Disposition: A | Discharge status: Home / Self Care | Payer: Medicaid Other | Attending: Emergency Medicine | Admitting: Emergency Medicine

## 2014-05-16 ENCOUNTER — Emergency Department (HOSPITAL_COMMUNITY): Payer: Medicaid Other

## 2014-05-16 ENCOUNTER — Encounter (HOSPITAL_COMMUNITY): Payer: Self-pay | Admitting: Emergency Medicine

## 2014-05-16 DIAGNOSIS — F319 Bipolar disorder, unspecified: Secondary | ICD-10-CM | POA: Insufficient documentation

## 2014-05-16 DIAGNOSIS — R079 Chest pain, unspecified: Secondary | ICD-10-CM | POA: Insufficient documentation

## 2014-05-16 DIAGNOSIS — R42 Dizziness and giddiness: Secondary | ICD-10-CM | POA: Insufficient documentation

## 2014-05-16 DIAGNOSIS — Z792 Long term (current) use of antibiotics: Secondary | ICD-10-CM | POA: Diagnosis not present

## 2014-05-16 DIAGNOSIS — Z8619 Personal history of other infectious and parasitic diseases: Secondary | ICD-10-CM | POA: Insufficient documentation

## 2014-05-16 DIAGNOSIS — R05 Cough: Secondary | ICD-10-CM

## 2014-05-16 DIAGNOSIS — R0789 Other chest pain: Secondary | ICD-10-CM

## 2014-05-16 DIAGNOSIS — E669 Obesity, unspecified: Secondary | ICD-10-CM | POA: Diagnosis not present

## 2014-05-16 DIAGNOSIS — Z79899 Other long term (current) drug therapy: Secondary | ICD-10-CM | POA: Diagnosis not present

## 2014-05-16 DIAGNOSIS — R059 Cough, unspecified: Secondary | ICD-10-CM | POA: Diagnosis not present

## 2014-05-16 DIAGNOSIS — R0602 Shortness of breath: Secondary | ICD-10-CM | POA: Diagnosis not present

## 2014-05-16 LAB — CBC
HCT: 34.8 % — ABNORMAL LOW (ref 36.0–46.0)
Hemoglobin: 12.4 g/dL (ref 12.0–15.0)
MCH: 40.9 pg — ABNORMAL HIGH (ref 26.0–34.0)
MCHC: 35.6 g/dL (ref 30.0–36.0)
MCV: 114.9 fL — ABNORMAL HIGH (ref 78.0–100.0)
Platelets: 182 10*3/uL (ref 150–400)
RBC: 3.03 MIL/uL — ABNORMAL LOW (ref 3.87–5.11)
RDW: 14.2 % (ref 11.5–15.5)
WBC: 5.1 10*3/uL (ref 4.0–10.5)

## 2014-05-16 LAB — COMPREHENSIVE METABOLIC PANEL
ALT: 81 U/L — ABNORMAL HIGH (ref 0–35)
AST: 115 U/L — ABNORMAL HIGH (ref 0–37)
Albumin: 3.9 g/dL (ref 3.5–5.2)
Alkaline Phosphatase: 107 U/L (ref 39–117)
Anion gap: 14 (ref 5–15)
BUN: 15 mg/dL (ref 6–23)
CO2: 23 mEq/L (ref 19–32)
Calcium: 9.4 mg/dL (ref 8.4–10.5)
Chloride: 102 mEq/L (ref 96–112)
Creatinine, Ser: 0.77 mg/dL (ref 0.50–1.10)
GFR calc Af Amer: >90 mL/min (ref >90)
GFR calc non Af Amer: >90 mL/min (ref >90)
Glucose, Bld: 108 mg/dL — ABNORMAL HIGH (ref 70–99)
Potassium: 4.1 mEq/L (ref 3.7–5.3)
Sodium: 139 mEq/L (ref 137–147)
Total Bilirubin: 1.1 mg/dL (ref 0.3–1.2)
Total Protein: 7.3 g/dL (ref 6.0–8.3)

## 2014-05-16 LAB — I-STAT TROPONIN, ED: Troponin i, poc: 0.01 ng/mL (ref 0.00–0.08)

## 2014-05-16 MED ORDER — HYDROMORPHONE HCL PF 1 MG/ML IJ SOLN
1.0000 mg | Freq: Once | INTRAMUSCULAR | Status: AC
  Administered 2014-05-16: 1 mg via INTRAVENOUS
  Filled 2014-05-16: qty 1

## 2014-05-16 MED ORDER — SODIUM CHLORIDE 0.9 % IV BOLUS (SEPSIS)
1000.0000 mL | INTRAVENOUS | Status: AC
  Administered 2014-05-16: 1000 mL via INTRAVENOUS

## 2014-05-16 MED ORDER — IBUPROFEN 800 MG PO TABS: 800.0000 mg | ORAL_TABLET | Freq: Three times a day (TID) | ORAL | Status: DC

## 2014-05-16 NOTE — Discharge Instructions (Signed)

## 2014-05-16 NOTE — ED Notes (Signed)
Pt states that she was given pain meds and no antibiotics at discharge for her PNA last week. States that she finished the pain meds and pain is back.

## 2014-05-16 NOTE — ED Provider Notes (Signed)
CSN: [id number]     Arrival date & time 05/16/14  1811 History   First MD Initiated Contact with Patient 05/16/14 2040     Chief Complaint  Patient presents with  . Chest Pain  . Dizziness     (Consider location/radiation/quality/duration/timing/severity/associated sxs/prior Treatment) Patient is a 48 y.o. female presenting with chest pain and dizziness. The history is provided by the patient.  Chest Pain Pain location:  R chest Pain quality: aching   Pain radiates to:  Does not radiate Pain radiates to the back: no   Pain severity:  Moderate Onset quality:  Gradual Timing:  Constant Progression:  Unchanged Chronicity:  New Context: breathing and at rest   Relieved by:  Nothing Worsened by:  Nothing tried Ineffective treatments: vicodin. Associated symptoms: cough and shortness of breath   Associated symptoms: no abdominal pain, no back pain, no dizziness, no fatigue, no fever, no headache, no nausea and not vomiting   Cough:    Cough characteristics:  Non-productive Dizziness Associated symptoms: chest pain and shortness of breath   Associated symptoms: no diarrhea, no headaches, no nausea and no vomiting     Past Medical History  Diagnosis Date  . Mental disorder   . Bipolar 1 disorder   . Bipolar 1 disorder   . Yeast infection of the vagina   . Bipolar disorder   . Obesity    Past Surgical History  Procedure Laterality Date  . No past surgeries    . Cholecystectomy     No family history on file. History  Substance Use Topics  . Smoking status: Never Smoker   . Smokeless tobacco: Never Used  . Alcohol Use: No   OB History   Grav Para Term Preterm Abortions TAB SAB Ect Mult Living   1 1 1       1      Review of Systems  Constitutional: Negative for fever and fatigue.  HENT: Negative for congestion and drooling.   Eyes: Negative for pain.  Respiratory: Positive for cough and shortness of breath.   Cardiovascular: Positive for chest pain.   Gastrointestinal: Negative for nausea, vomiting, abdominal pain and diarrhea.  Genitourinary: Negative for dysuria and hematuria.  Musculoskeletal: Negative for back pain, gait problem and neck pain.  Skin: Negative for color change.  Neurological: Positive for light-headedness. Negative for dizziness and headaches.  Hematological: Negative for adenopathy.  Psychiatric/Behavioral: Negative for behavioral problems.  All other systems reviewed and are negative.     Allergies  Sulfa antibiotics and Tramadol  Home Medications   Prior to Admission medications   Medication Sig Start Date End Date Taking? Authorizing Provider  aspirin 325 MG tablet Take 650 mg by mouth every 6 (six) hours as needed (pain).   Yes Historical Provider, MD  citalopram (CELEXA) 20 MG tablet Take 20 mg by mouth daily.   Yes Historical Provider, MD  lamoTRIgine (LAMICTAL) 25 MG tablet Take 50 mg by mouth daily.   Yes Historical Provider, MD  nitrofurantoin (MACRODANTIN) 100 MG capsule Take 200 mg by mouth 2 (two) times daily. Take for 7 days picked up from pharmacy at 05/16/14   Yes Historical Provider, MD  ondansetron (ZOFRAN ODT) 4 MG disintegrating tablet Take 1 tablet (4 mg total) by mouth every 8 (eight) hours as needed for nausea. 05/10/14  Yes Tanna Furry, MD   BP 124/76  Pulse 92  Temp(Src) 97.8 F (36.6 C) (Oral)  Resp 18  SpO2 96%  LMP 05/02/2014 Physical Exam  Nursing note and vitals reviewed. Constitutional: She is oriented to person, place, and time. She appears well-developed and well-nourished.  HENT:  Head: Normocephalic.  Mouth/Throat: Oropharynx is clear and moist. No oropharyngeal exudate.  Eyes: Conjunctivae and EOM are normal. Pupils are equal, round, and reactive to light.  Neck: Normal range of motion. Neck supple.  Cardiovascular: Normal rate, regular rhythm, normal heart sounds and intact distal pulses.  Exam reveals no gallop and no friction rub.   No murmur  heard. Pulmonary/Chest: Effort normal and breath sounds normal. No respiratory distress. She has no wheezes.  Abdominal: Soft. Bowel sounds are normal. There is no tenderness. There is no rebound and no guarding.  Musculoskeletal: Normal range of motion. She exhibits no edema and no tenderness.  Neurological: She is alert and oriented to person, place, and time.  Skin: Skin is warm and dry.  Psychiatric: She has a normal mood and affect. Her behavior is normal.    ED Course  Procedures (including critical care time) Labs Review Labs Reviewed  CBC - Abnormal; Notable for the following:    RBC 3.03 (*)    HCT 34.8 (*)    MCV 114.9 (*)    MCH 40.9 (*)    All other components within normal limits  COMPREHENSIVE METABOLIC PANEL - Abnormal; Notable for the following:    Glucose, Bld 108 (*)    AST 115 (*)    ALT 81 (*)    All other components within normal limits  I-STAT TROPOININ, ED    Imaging Review Dg Chest 2 View  05/16/2014   CLINICAL DATA:  Chest pain and dizziness.  EXAM: CHEST  2 VIEW  COMPARISON:  Single view of the chest 05/10/2014 and 12/31/2013.  FINDINGS: The patient has a small right pleural effusion and mild basilar atelectasis. The lungs are otherwise clear. Heart size is normal. No pulmonary edema.  IMPRESSION: Small right pleural effusion with associated mild basilar atelectasis.   Electronically Signed   By: Inge Rise M.D.   On: 05/16/2014 20:19     EKG Interpretation None      Date: 05/16/2014  Rate: 116  Rhythm: sinus tachycardia  QRS Axis: normal  Intervals: normal  ST/T Wave abnormalities: normal  Conduction Disutrbances:none  Narrative Interpretation: No significant changes since last ecg  Old EKG Reviewed: unchanged    MDM   Final diagnoses:  Other chest pain  Cough  Lightheadedness    9:10 PM 48 y.o. female with a recent visit for right lower lobe pneumonia who presents with ongoing   lightheadedness with standing. she was sent home  on Vicodin, Zofran, naproxen, and Levaquin. She is finished antibiotics but continues to have some right-sided constant chest pain. She also has some lightheadedness with ambulation. She has some mild shortness of breath as well. She is afebrile and vital signs are unremarkable here. Labs are thus far noncontributory and chest x-ray shows a small right pleural effusion. Will get pain control and IV fluid bolus. No RF's for blood clots. Likely resolving pna w/ small pleural eff on right base seen on CXR.   11:07 PM: Labs/imaging interpreted/reviewed and otherwise non-contrib. Pain likely assoc w/ resolving pna Low risk for MACE per HEART score. Pt requested ibuprofen although I offered narcotics.  I have discussed the diagnosis/risks/treatment options with the patient and believe the pt to be eligible for discharge home to follow-up with her pcp this week. We also discussed returning to the ED immediately if new or worsening sx  occur. We discussed the sx which are most concerning (e.g., worsening pain, sob, fever) that necessitate immediate return. Medications administered to the patient during their visit and any new prescriptions provided to the patient are listed below.  Medications given during this visit Medications  sodium chloride 0.9 % bolus 1,000 mL (0 mLs Intravenous Stopped 05/16/14 2259)  HYDROmorphone (DILAUDID) injection 1 mg (1 mg Intravenous Given 05/16/14 2139)    New Prescriptions   IBUPROFEN (ADVIL,MOTRIN) 800 MG TABLET    Take 1 tablet (800 mg total) by mouth 3 (three) times daily.     Blanchard Kelch, MD 05/16/14 [phone]

## 2014-05-16 NOTE — ED Notes (Signed)
Pt states that she was treated for pneumonia on 05/06/2014; was given pain medication, which did not work, but she took it anyway. Pain is as intense as it was 10 days ago. NAD noted.

## 2014-06-25 ENCOUNTER — Encounter (HOSPITAL_COMMUNITY): Payer: Self-pay | Admitting: Emergency Medicine

## 2014-06-25 DIAGNOSIS — N760 Acute vaginitis: Secondary | ICD-10-CM | POA: Insufficient documentation

## 2014-06-25 DIAGNOSIS — F319 Bipolar disorder, unspecified: Secondary | ICD-10-CM | POA: Diagnosis not present

## 2014-06-25 DIAGNOSIS — Z8619 Personal history of other infectious and parasitic diseases: Secondary | ICD-10-CM | POA: Insufficient documentation

## 2014-06-25 DIAGNOSIS — Z3202 Encounter for pregnancy test, result negative: Secondary | ICD-10-CM | POA: Insufficient documentation

## 2014-06-25 DIAGNOSIS — N390 Urinary tract infection, site not specified: Secondary | ICD-10-CM | POA: Diagnosis not present

## 2014-06-25 DIAGNOSIS — E669 Obesity, unspecified: Secondary | ICD-10-CM | POA: Diagnosis not present

## 2014-06-25 DIAGNOSIS — Z79899 Other long term (current) drug therapy: Secondary | ICD-10-CM | POA: Diagnosis not present

## 2014-06-25 DIAGNOSIS — N898 Other specified noninflammatory disorders of vagina: Secondary | ICD-10-CM | POA: Diagnosis present

## 2014-06-25 LAB — CBC WITH DIFFERENTIAL/PLATELET
Basophils Absolute: 0 10*3/uL (ref 0.0–0.1)
Basophils Relative: 0 % (ref 0–1)
Eosinophils Absolute: 0 10*3/uL (ref 0.0–0.7)
Eosinophils Relative: 0 % (ref 0–5)
HCT: 29.7 % — ABNORMAL LOW (ref 36.0–46.0)
Hemoglobin: 10.7 g/dL — ABNORMAL LOW (ref 12.0–15.0)
Lymphocytes Relative: 38 % (ref 12–46)
Lymphs Abs: 1.2 10*3/uL (ref 0.7–4.0)
MCH: 40.8 pg — ABNORMAL HIGH (ref 26.0–34.0)
MCHC: 36 g/dL (ref 30.0–36.0)
MCV: 113.4 fL — ABNORMAL HIGH (ref 78.0–100.0)
Monocytes Absolute: 0.1 10*3/uL (ref 0.1–1.0)
Monocytes Relative: 4 % (ref 3–12)
Neutro Abs: 1.8 10*3/uL (ref 1.7–7.7)
Neutrophils Relative %: 58 % (ref 43–77)
Platelets: 147 10*3/uL — ABNORMAL LOW (ref 150–400)
RBC: 2.62 MIL/uL — ABNORMAL LOW (ref 3.87–5.11)
RDW: 14.2 % (ref 11.5–15.5)
WBC: 3.1 10*3/uL — ABNORMAL LOW (ref 4.0–10.5)

## 2014-06-25 LAB — COMPREHENSIVE METABOLIC PANEL
ALT: 65 U/L — ABNORMAL HIGH (ref 0–35)
AST: 75 U/L — ABNORMAL HIGH (ref 0–37)
Albumin: 3.7 g/dL (ref 3.5–5.2)
Alkaline Phosphatase: 99 U/L (ref 39–117)
Anion gap: 12 (ref 5–15)
BUN: 13 mg/dL (ref 6–23)
CO2: 24 mEq/L (ref 19–32)
Calcium: 8.6 mg/dL (ref 8.4–10.5)
Chloride: 101 mEq/L (ref 96–112)
Creatinine, Ser: 0.87 mg/dL (ref 0.50–1.10)
GFR calc Af Amer: >90 mL/min (ref >90)
GFR calc non Af Amer: 79 mL/min — ABNORMAL LOW (ref >90)
Glucose, Bld: 94 mg/dL (ref 70–99)
Potassium: 4.2 mEq/L (ref 3.7–5.3)
Sodium: 137 mEq/L (ref 137–147)
Total Bilirubin: 1.3 mg/dL — ABNORMAL HIGH (ref 0.3–1.2)
Total Protein: 6.6 g/dL (ref 6.0–8.3)

## 2014-06-25 LAB — LIPASE, BLOOD: Lipase: 25 U/L (ref 11–59)

## 2014-06-25 NOTE — ED Notes (Signed)
The pt has had a vaginal discharge for 2 months.  She is going through menopause.  She has had some back pain .  No temp

## 2014-06-26 ENCOUNTER — Emergency Department (HOSPITAL_COMMUNITY)
Admission: EM | Admit: 2014-06-26 | Discharge: 2014-06-26 | Disposition: A | Discharge status: Home / Self Care | Payer: Medicaid Other | Attending: Emergency Medicine | Admitting: Emergency Medicine

## 2014-06-26 DIAGNOSIS — N898 Other specified noninflammatory disorders of vagina: Secondary | ICD-10-CM

## 2014-06-26 DIAGNOSIS — N390 Urinary tract infection, site not specified: Secondary | ICD-10-CM

## 2014-06-26 DIAGNOSIS — N760 Acute vaginitis: Secondary | ICD-10-CM

## 2014-06-26 DIAGNOSIS — Z711 Person with feared health complaint in whom no diagnosis is made: Secondary | ICD-10-CM

## 2014-06-26 LAB — WET PREP, GENITAL
Trich, Wet Prep: NONE SEEN
Yeast Wet Prep HPF POC: NONE SEEN

## 2014-06-26 LAB — URINALYSIS, ROUTINE W REFLEX MICROSCOPIC
Glucose, UA: NEGATIVE mg/dL
Hgb urine dipstick: NEGATIVE
Ketones, ur: 15 mg/dL — AB
Nitrite: POSITIVE — AB
Protein, ur: NEGATIVE mg/dL
Specific Gravity, Urine: 1.024 (ref 1.005–1.030)
Urobilinogen, UA: 1 mg/dL (ref 0.0–1.0)
pH: 5.5 (ref 5.0–8.0)

## 2014-06-26 LAB — URINE MICROSCOPIC-ADD ON

## 2014-06-26 LAB — PREGNANCY, URINE: Preg Test, Ur: NEGATIVE

## 2014-06-26 MED ORDER — CEPHALEXIN 500 MG PO CAPS: 500.0000 mg | ORAL_CAPSULE | Freq: Four times a day (QID) | ORAL | Status: DC

## 2014-06-26 MED ORDER — CEFTRIAXONE SODIUM 1 G IJ SOLR
1.0000 g | Freq: Once | INTRAMUSCULAR | Status: AC
  Administered 2014-06-26: 1 g via INTRAMUSCULAR
  Filled 2014-06-26: qty 10

## 2014-06-26 MED ORDER — HYDROCODONE-ACETAMINOPHEN 5-325 MG PO TABS
2.0000 | ORAL_TABLET | Freq: Once | ORAL | Status: AC
  Administered 2014-06-26: 2 via ORAL
  Filled 2014-06-26: qty 2

## 2014-06-26 MED ORDER — STERILE WATER FOR INJECTION IJ SOLN
INTRAMUSCULAR | Status: AC
  Administered 2014-06-26: 10 mL
  Filled 2014-06-26: qty 10

## 2014-06-26 MED ORDER — AZITHROMYCIN 250 MG PO TABS
1000.0000 mg | ORAL_TABLET | Freq: Once | ORAL | Status: AC
  Administered 2014-06-26: 1000 mg via ORAL
  Filled 2014-06-26: qty 4

## 2014-06-26 NOTE — ED Provider Notes (Signed)
Medical screening examination/treatment/procedure(s) were performed by non-physician practitioner and as supervising physician I was immediately available for consultation/collaboration.   EKG Interpretation None        Julianne Rice, MD 06/26/14 [phone]

## 2014-06-26 NOTE — ED Provider Notes (Signed)
CSN: [id number]     Arrival date & time 06/25/14  2145 History   First MD Initiated Contact with Patient 06/26/14 0023     Chief Complaint  Patient presents with  . Vaginal Discharge     (Consider location/radiation/quality/duration/timing/severity/associated sxs/prior Treatment) The history is provided by the patient and medical records. No language interpreter was used.    Jordan [name] is a 48 y.o. female  with a hx of mental disorder, Bipolar disorder presents to the Emergency Department complaining of gradual, persistent, progressively worsening vaginal itching, odor and discharge onset 2 mos ago. Associated symptoms include feeling fatigued with low back pain.  Nothing makes it better and nothing makes it worse.  Pt denies fever, chills, headache, neck pain, chest pain, SOB, abd pain, N/V/D, weakness, dizziness.  Pt reports recurrent UTIs and pyelonephritis in the past, but never any kidney stones.  She also reports years infections in the past.  Pt is sexually active with 1 female partner.  Pt reports she is going through menopause and LMP was 4 mos ago.      Past Medical History  Diagnosis Date  . Mental disorder   . Bipolar 1 disorder   . Bipolar 1 disorder   . Yeast infection of the vagina   . Bipolar disorder   . Obesity    Past Surgical History  Procedure Laterality Date  . No past surgeries    . Cholecystectomy     No family history on file. History  Substance Use Topics  . Smoking status: Never Smoker   . Smokeless tobacco: Never Used  . Alcohol Use: No   OB History   Grav Para Term Preterm Abortions TAB SAB Ect Mult Living   1 1 1       1      Review of Systems  Constitutional: Negative for fever, diaphoresis, appetite change, fatigue and unexpected weight change.  HENT: Negative for mouth sores.   Eyes: Negative for visual disturbance.  Respiratory: Negative for cough, chest tightness, shortness of breath and wheezing.   Cardiovascular: Negative for chest  pain.  Gastrointestinal: Negative for nausea, vomiting, abdominal pain, diarrhea and constipation.  Endocrine: Negative for polydipsia, polyphagia and polyuria.  Genitourinary: Positive for vaginal discharge. Negative for dysuria, urgency, frequency and hematuria.       Vaginal itching Foul smelling urine  Musculoskeletal: Negative for back pain and neck stiffness.  Skin: Negative for rash.  Allergic/Immunologic: Negative for immunocompromised state.  Neurological: Negative for syncope, light-headedness and headaches.  Hematological: Does not bruise/bleed easily.  Psychiatric/Behavioral: Negative for sleep disturbance. The patient is not nervous/anxious.       Allergies  Sulfa antibiotics and Tramadol  Home Medications   Prior to Admission medications   Medication Sig Start Date End Date Taking? Authorizing Provider  citalopram (CELEXA) 20 MG tablet Take 20 mg by mouth daily.   Yes Historical Provider, MD  lamoTRIgine (LAMICTAL) 25 MG tablet Take 50 mg by mouth daily.   Yes Historical Provider, MD  cephALEXin (KEFLEX) 500 MG capsule Take 1 capsule (500 mg total) by mouth 4 (four) times daily. 06/26/14   Miroslava Santellan, PA-C   BP 112/69  Pulse 84  Temp(Src) 98.5 F (36.9 C) (Oral)  Resp 20  SpO2 97%  LMP 05/25/2014 Physical Exam  Nursing note and vitals reviewed. Constitutional: She appears well-developed and well-nourished. No distress.  Awake, alert, nontoxic appearance Obese  HENT:  Head: Normocephalic and atraumatic.  Mouth/Throat: Oropharynx is clear and moist. No oropharyngeal  exudate.  Eyes: Conjunctivae are normal. No scleral icterus.  Neck: Normal range of motion. Neck supple.  Full ROM without pain  Cardiovascular: Normal rate, regular rhythm, normal heart sounds and intact distal pulses.   No murmur heard. Pulmonary/Chest: Effort normal and breath sounds normal. No respiratory distress. She has no wheezes.  Equal chest expansion  Abdominal: Soft. Bowel  sounds are normal. She exhibits no distension and no mass. There is no tenderness. There is no rebound, no guarding and no CVA tenderness. Hernia confirmed negative in the right inguinal area and confirmed negative in the left inguinal area.  No CVA tenderness  Genitourinary: Uterus normal. No labial fusion. There is no rash, tenderness or lesion on the right labia. There is no rash, tenderness or lesion on the left labia. Uterus is not deviated, not enlarged, not fixed and not tender. Cervix exhibits friability. Cervix exhibits no motion tenderness and no discharge. Right adnexum displays no mass, no tenderness and no fullness. Left adnexum displays no mass, no tenderness and no fullness. No erythema, tenderness or bleeding around the vagina. No foreign body around the vagina. No signs of injury around the vagina. Vaginal discharge ( large amount, yellow, malodourous) found.  Cervix is friable No CMT  Musculoskeletal: Normal range of motion. She exhibits no edema.  Full range of motion of the T-spine and L-spine No tenderness to palpation of the spinous processes of the T-spine or L-spine Mild tenderness to palpation of the left paraspinous muscles of the L-spine  Lymphadenopathy:    She has no cervical adenopathy.       Right: No inguinal adenopathy present.       Left: No inguinal adenopathy present.  Neurological: She is alert. She has normal reflexes. GCS eye subscore is 4. GCS verbal subscore is 5. GCS motor subscore is 6.  Speech is clear and goal oriented, follows commands Normal 5/5 strength in upper and lower extremities bilaterally including dorsiflexion and plantar flexion, strong and equal grip strength Sensation normal to light and sharp touch Moves extremities without ataxia, coordination intact Normal gait Normal balance   Skin: Skin is warm and dry. No rash noted. She is not diaphoretic. No erythema.  Psychiatric: She has a normal mood and affect. Her behavior is normal.     ED Course  Procedures (including critical care time) Labs Review Labs Reviewed  WET PREP, GENITAL - Abnormal; Notable for the following:    Clue Cells Wet Prep HPF POC FEW (*)    WBC, Wet Prep HPF POC TOO NUMEROUS TO COUNT (*)    All other components within normal limits  CBC WITH DIFFERENTIAL - Abnormal; Notable for the following:    WBC 3.1 (*)    RBC 2.62 (*)    Hemoglobin 10.7 (*)    HCT 29.7 (*)    MCV 113.4 (*)    MCH 40.8 (*)    Platelets 147 (*)    All other components within normal limits  COMPREHENSIVE METABOLIC PANEL - Abnormal; Notable for the following:    AST 75 (*)    ALT 65 (*)    Total Bilirubin 1.3 (*)    GFR calc non Af Amer 79 (*)    All other components within normal limits  URINALYSIS, ROUTINE W REFLEX MICROSCOPIC - Abnormal; Notable for the following:    Color, Urine AMBER (*)    APPearance CLOUDY (*)    Bilirubin Urine SMALL (*)    Ketones, ur 15 (*)    Nitrite  POSITIVE (*)    Leukocytes, UA LARGE (*)    All other components within normal limits  URINE MICROSCOPIC-ADD ON - Abnormal; Notable for the following:    Squamous Epithelial / LPF FEW (*)    Bacteria, UA MANY (*)    All other components within normal limits  GC/CHLAMYDIA PROBE AMP  URINE CULTURE  LIPASE, BLOOD  PREGNANCY, URINE    Imaging Review No results found.   EKG Interpretation None      MDM   Final diagnoses:  Vaginal discharge  UTI (lower urinary tract infection)  Vaginitis  Concern about STD in female without diagnosis    Jordan [name] presents with vaginal itching, foul smelling urine and generalized fatigue for the last 2 mos.  Pt without fever or tachycardia.  No abd pain, CVA tenderness, CMT on exam.  UA with evidence of UTI, but no evidence of AKI.  Patient with mild pancytopenia from last visit however question possible lab error. Hemoglobin 10.7 today.  Patient denies shortness of breath, chest pain, but mild anemia may be the source of her fatigue. Patient  reports she has a primary care physician and can followup next week. She denies hematochezia, melena or other sources of bleeding.  Pelvic exam with yellow frothy discharge and friable cervix. No cervical motion tenderness. Doubt PID.   Record review shows that in July patient had urinary tract infection which grew Escherichia coli and was susceptible to Rocephin. Will give IM Rocephin here in the emergency department and discharged home with Keflex.  1:29 AM Wet Prep with TNTC WBCs.  Will treat with Azithro and rocephin to cover for STDs.  Gonorrhea and Chlamydia cultures in addition to urine culture pending.  Pt is to f/u with her PCP in 3 days for repeat labwork to assess for anemia.  Pt also to f/u with her OB/GYN for further evaluation of her vaginal discharge.    I have personally reviewed patient's vitals, nursing note and any pertinent labs or imaging.  I performed an undressed physical exam.    At this time, it has been determined that no acute conditions requiring further emergency intervention. The patient/guardian have been advised of the diagnosis and plan. I reviewed all labs and imaging including any potential incidental findings. We have discussed signs and symptoms that warrant return to the ED, such as fevers, chills, intractable vomiting or other concerning factors.  Patient/guardian has voiced understanding and agreed to follow-up with the PCP or specialist in 3 days.  Vital signs are stable at discharge.   BP 112/69  Pulse 84  Temp(Src) 98.5 F (36.9 C) (Oral)  Resp 20  SpO2 97%  LMP 05/25/2014        Abigail Butts, PA-C 06/26/14 0145

## 2014-06-26 NOTE — Discharge Instructions (Signed)
1. Medications: keflex, usual home medications 2. Treatment: rest, drink plenty of fluids, take medications until completed 3. Follow Up: Please followup with your primary doctor and OB/GYN in 3 days for discussion of your diagnoses and further evaluation after today's visit;    Urinary Tract Infection Urinary tract infections (UTIs) can develop anywhere along your urinary tract. Your urinary tract is your body's drainage system for removing wastes and extra water. Your urinary tract includes two kidneys, two ureters, a bladder, and a urethra. Your kidneys are a pair of bean-shaped organs. Each kidney is about the size of your fist. They are located below your ribs, one on each side of your spine. CAUSES Infections are caused by microbes, which are microscopic organisms, including fungi, viruses, and bacteria. These organisms are so small that they can only be seen through a microscope. Bacteria are the microbes that most commonly cause UTIs. SYMPTOMS  Symptoms of UTIs may vary by age and gender of the patient and by the location of the infection. Symptoms in young women typically include a frequent and intense urge to urinate and a painful, burning feeling in the bladder or urethra during urination. Older women and men are more likely to be tired, shaky, and weak and have muscle aches and abdominal pain. A fever may mean the infection is in your kidneys. Other symptoms of a kidney infection include pain in your back or sides below the ribs, nausea, and vomiting. DIAGNOSIS To diagnose a UTI, your caregiver will ask you about your symptoms. Your caregiver also will ask to provide a urine sample. The urine sample will be tested for bacteria and white blood cells. White blood cells are made by your body to help fight infection. TREATMENT  Typically, UTIs can be treated with medication. Because most UTIs are caused by a bacterial infection, they usually can be treated with the use of antibiotics. The choice  of antibiotic and length of treatment depend on your symptoms and the type of bacteria causing your infection. HOME CARE INSTRUCTIONS  If you were prescribed antibiotics, take them exactly as your caregiver instructs you. Finish the medication even if you feel better after you have only taken some of the medication.  Drink enough water and fluids to keep your urine clear or pale yellow.  Avoid caffeine, tea, and carbonated beverages. They tend to irritate your bladder.  Empty your bladder often. Avoid holding urine for long periods of time.  Empty your bladder before and after sexual intercourse.  After a bowel movement, women should cleanse from front to back. Use each tissue only once. SEEK MEDICAL CARE IF:   You have back pain.  You develop a fever.  Your symptoms do not begin to resolve within 3 days. SEEK IMMEDIATE MEDICAL CARE IF:   You have severe back pain or lower abdominal pain.  You develop chills.  You have nausea or vomiting.  You have continued burning or discomfort with urination. MAKE SURE YOU:   Understand these instructions.  Will watch your condition.  Will get help right away if you are not doing well or get worse. Document Released: 07/17/2005 Document Revised: 04/07/2012 Document Reviewed: 11/15/2011 [facility] Patient Information 2015 [place], [place]. This information is not intended to replace advice given to you by your health care provider. Make sure you discuss any questions you have with your health care provider.  Vaginitis Vaginitis is an inflammation of the vagina. It is most often caused by a change in the normal balance of  the bacteria and yeast that live in the vagina. This change in balance causes an overgrowth of certain bacteria or yeast, which causes the inflammation. There are different types of vaginitis, but the most common types are:  Bacterial vaginosis.  Yeast infection (candidiasis).  Trichomoniasis vaginitis. This is a  sexually transmitted infection (STI).  Viral vaginitis.  Atropic vaginitis.  Allergic vaginitis. CAUSES  The cause depends on the type of vaginitis. Vaginitis can be caused by:  Bacteria (bacterial vaginosis).  Yeast (yeast infection).  A parasite (trichomoniasis vaginitis)  A virus (viral vaginitis).  Low hormone levels (atrophic vaginitis). Low hormone levels can occur during pregnancy, breastfeeding, or after menopause.  Irritants, such as bubble baths, scented tampons, and feminine sprays (allergic vaginitis). Other factors can change the normal balance of the yeast and bacteria that live in the vagina. These include:  Antibiotic medicines.  Poor hygiene.  Diaphragms, vaginal sponges, spermicides, birth control pills, and intrauterine devices (IUD).  Sexual intercourse.  Infection.  Uncontrolled diabetes.  A weakened immune system. SYMPTOMS  Symptoms can vary depending on the cause of the vaginitis. Common symptoms include:  Abnormal vaginal discharge.  The discharge is white, gray, or yellow with bacterial vaginosis.  The discharge is thick, white, and cheesy with a yeast infection.  The discharge is frothy and yellow or greenish with trichomoniasis.  A bad vaginal odor.  The odor is fishy with bacterial vaginosis.  Vaginal itching, pain, or swelling.  Painful intercourse.  Pain or burning when urinating. Sometimes, there are no symptoms. TREATMENT  Treatment will vary depending on the type of infection.   Bacterial vaginosis and trichomoniasis are often treated with antibiotic creams or pills.  Yeast infections are often treated with antifungal medicines, such as vaginal creams or suppositories.  Viral vaginitis has no cure, but symptoms can be treated with medicines that relieve discomfort. Your sexual partner should be treated as well.  Atrophic vaginitis may be treated with an estrogen cream, pill, suppository, or vaginal ring. If vaginal  dryness occurs, lubricants and moisturizing creams may help. You may be told to avoid scented soaps, sprays, or douches.  Allergic vaginitis treatment involves quitting the use of the product that is causing the problem. Vaginal creams can be used to treat the symptoms. HOME CARE INSTRUCTIONS   Take all medicines as directed by your caregiver.  Keep your genital area clean and dry. Avoid soap and only rinse the area with water.  Avoid douching. It can remove the healthy bacteria in the vagina.  Do not use tampons or have sexual intercourse until your vaginitis has been treated. Use sanitary pads while you have vaginitis.  Wipe from front to back. This avoids the spread of bacteria from the rectum to the vagina.  Let air reach your genital area.  Wear cotton underwear to decrease moisture buildup.  Avoid wearing underwear while you sleep until your vaginitis is gone.  Avoid tight pants and underwear or nylons without a cotton panel.  Take off wet clothing (especially bathing suits) as soon as possible.  Use mild, non-scented products. Avoid using irritants, such as:  Scented feminine sprays.  Fabric softeners.  Scented detergents.  Scented tampons.  Scented soaps or bubble baths.  Practice safe sex and use condoms. Condoms may prevent the spread of trichomoniasis and viral vaginitis. SEEK MEDICAL CARE IF:   You have abdominal pain.  You have a fever or persistent symptoms for more than 2-3 days.  You have a fever and your symptoms suddenly  get worse. Document Released: 08/04/2007 Document Revised: 07/01/2012 Document Reviewed: 03/19/2012 [facility] Patient Information 2015 [place], [place]. This information is not intended to replace advice given to you by your health care provider. Make sure you discuss any questions you have with your health care provider.

## 2014-06-28 ENCOUNTER — Telehealth (HOSPITAL_COMMUNITY): Payer: Self-pay

## 2014-06-28 LAB — GC/CHLAMYDIA PROBE AMP
CT Probe RNA: NEGATIVE
GC Probe RNA: NEGATIVE

## 2014-06-29 ENCOUNTER — Telehealth: Payer: Self-pay | Admitting: *Deleted

## 2014-06-29 NOTE — Telephone Encounter (Signed)
Jordan [name] called and left a message she went to [facility] and they did tests- wants results of those tests.  Per chart review see Jordan [name] ER nurse called patient to give results and told pt to call us re: pap results. Per chart review last pap was 2013.  Called Jordan [name] and left message we are returning her call- please call our office .

## 2014-06-29 NOTE — Telephone Encounter (Signed)
Called patient back and she states she called [facility] and asked for her pap results but they wouldn't give them to her and told her to call us. Discussed with patient that a speculum exam was not a pap smear and that she can only have a pap smear done in the doctors office, not an emergency room and that her last pap smear was in 2013 with [name]. Told patient that we cannot give her the results since they were not done here in our office and to call the ER back and ask for test results from Sunday and to not say anything about a pap smear but that she wants test results. Patient verbalized understanding and had no other questions

## 2014-06-30 LAB — URINE CULTURE: Colony Count: >=100000

## 2014-07-01 ENCOUNTER — Telehealth (HOSPITAL_COMMUNITY): Payer: Self-pay

## 2014-07-01 NOTE — ED Notes (Signed)
Post ED Visit - Positive Culture Follow-up  Culture report reviewed by antimicrobial stewardship pharmacist: []  Wes [place], Pharm.D., BCPS [x]  Heide Guile, Pharm.D., BCPS []  Alycia Rossetti, Pharm.D., BCPS []  [place], Pharm.D., BCPS, AAHIVP []  Legrand Como, Pharm.D., BCPS, AAHIVP []  Carly Sabat, Pharm.D. []  Elenor Quinones, Pharm.D.  Positive urine culture Treated with cephalexin, organism sensitive to the same and no further patient follow-up is required at this time.  Ileene Musa 07/01/2014, 5:47 PM

## 2014-07-09 ENCOUNTER — Encounter (HOSPITAL_COMMUNITY): Payer: Self-pay | Admitting: Emergency Medicine

## 2014-07-09 ENCOUNTER — Emergency Department (HOSPITAL_COMMUNITY)
Admission: EM | Admit: 2014-07-09 | Discharge: 2014-07-09 | Disposition: A | Discharge status: Home / Self Care | Payer: Medicaid Other | Attending: Emergency Medicine | Admitting: Emergency Medicine

## 2014-07-09 DIAGNOSIS — Z8619 Personal history of other infectious and parasitic diseases: Secondary | ICD-10-CM | POA: Insufficient documentation

## 2014-07-09 DIAGNOSIS — Z792 Long term (current) use of antibiotics: Secondary | ICD-10-CM | POA: Diagnosis not present

## 2014-07-09 DIAGNOSIS — N751 Abscess of Bartholin's gland: Secondary | ICD-10-CM | POA: Insufficient documentation

## 2014-07-09 DIAGNOSIS — R000 Tachycardia, unspecified: Secondary | ICD-10-CM | POA: Insufficient documentation

## 2014-07-09 DIAGNOSIS — F319 Bipolar disorder, unspecified: Secondary | ICD-10-CM | POA: Insufficient documentation

## 2014-07-09 DIAGNOSIS — R1909 Other intra-abdominal and pelvic swelling, mass and lump: Secondary | ICD-10-CM | POA: Diagnosis present

## 2014-07-09 DIAGNOSIS — E669 Obesity, unspecified: Secondary | ICD-10-CM | POA: Diagnosis not present

## 2014-07-09 DIAGNOSIS — Z79899 Other long term (current) drug therapy: Secondary | ICD-10-CM | POA: Insufficient documentation

## 2014-07-09 MED ORDER — LIDOCAINE HCL (PF) 1 % IJ SOLN
5.0000 mL | Freq: Once | INTRAMUSCULAR | Status: AC
  Administered 2014-07-09: 5 mL via INTRADERMAL
  Filled 2014-07-09: qty 5

## 2014-07-09 NOTE — ED Notes (Addendum)
Soreness on rt. Side of groin. Describes as a reddened area on the outside, lateral side of vagina. Significant other tried to pop it last night. Taking antibiotics for recent uti.

## 2014-07-09 NOTE — ED Provider Notes (Signed)
CSN: [id number]     Arrival date & time 07/09/14  1329 History   First MD Initiated Contact with Patient 07/09/14 1532     Chief Complaint  Patient presents with  . Groin Swelling     (Consider location/radiation/quality/duration/timing/severity/associated sxs/prior Treatment) The history is provided by the patient and medical records. No language interpreter was used.    Jordan [name] is a 48 y.o. female  with a hx of developmental delay, obesity, bipolar disorder presents to the Emergency Department complaining of gradual, persistent, progressively worsening "lump on her vagina" onset yesterday. Associated symptoms include pain and swelling at the site. Pt reports her boyfriend tried to "pop it" by squeezing it, without success.  Pt reports hx of pilonidal abscess with surgical excision but no other recurrent abscesses.  No treatments PTA were attempted.  Nothing makes it better and palpation makes it worse.  Pt denies fever, chills, chest pain no shortness of breath, abdominal pain, nausea, vomiting, dysuria, hematuria.    Patient was seen on 06/26/2012 for vaginal discharge and diagnosed with urinary tract infection and vaginal infection. Patient treated with Rocephin and azithromycin for concern of possible STD however her creatinine, get cultures returned negative. Urine culture grew Escherichia coli sensitive to cephalexin which she was prescribed at discharge.  Patient reports that her dysuria, vaginal itching and vaginal discharge have all resolved.  Past Medical History  Diagnosis Date  . Mental disorder   . Bipolar 1 disorder   . Bipolar 1 disorder   . Yeast infection of the vagina   . Bipolar disorder   . Obesity    Past Surgical History  Procedure Laterality Date  . No past surgeries    . Cholecystectomy     History reviewed. No pertinent family history. History  Substance Use Topics  . Smoking status: Never Smoker   . Smokeless tobacco: Never Used  . Alcohol Use: No     OB History   Grav Para Term Preterm Abortions TAB SAB Ect Mult Living   1 1 1       1      Review of Systems  Constitutional: Negative for fever, diaphoresis, appetite change, fatigue and unexpected weight change.  HENT: Negative for mouth sores.   Eyes: Negative for visual disturbance.  Respiratory: Negative for cough, chest tightness, shortness of breath and wheezing.   Cardiovascular: Negative for chest pain.  Gastrointestinal: Negative for nausea, vomiting, abdominal pain, diarrhea and constipation.  Endocrine: Negative for polydipsia, polyphagia and polyuria.  Genitourinary: Positive for vaginal pain. Negative for dysuria, urgency, frequency and hematuria.  Musculoskeletal: Negative for back pain and neck stiffness.  Skin: Negative for rash.  Allergic/Immunologic: Negative for immunocompromised state.  Neurological: Negative for syncope, light-headedness and headaches.  Hematological: Does not bruise/bleed easily.  Psychiatric/Behavioral: Negative for sleep disturbance. The patient is not nervous/anxious.       Allergies  Sulfa antibiotics and Tramadol  Home Medications   Prior to Admission medications   Medication Sig Start Date End Date Taking? Authorizing Provider  cephALEXin (KEFLEX) 500 MG capsule Take 500 mg by mouth 4 (four) times daily.   Yes Historical Provider, MD  citalopram (CELEXA) 20 MG tablet Take 20 mg by mouth daily.   Yes Historical Provider, MD  lamoTRIgine (LAMICTAL) 25 MG tablet Take 50 mg by mouth daily.   Yes Historical Provider, MD   BP 151/94  Pulse 102  Temp(Src) 98.3 F (36.8 C) (Oral)  Resp 18  SpO2 97%  LMP 05/25/2014 Physical Exam  Nursing note and vitals reviewed. Constitutional: She appears well-developed and well-nourished. No distress.  Awake, alert, nontoxic appearance  HENT:  Head: Normocephalic and atraumatic.  Mouth/Throat: Oropharynx is clear and moist. No oropharyngeal exudate.  Eyes: Conjunctivae are normal. No scleral  icterus.  Neck: Normal range of motion. Neck supple.  Cardiovascular: Regular rhythm, normal heart sounds and intact distal pulses.   No murmur heard. Mild tachycardia  Pulmonary/Chest: Effort normal and breath sounds normal. No respiratory distress. She has no wheezes.  Equal chest expansion  Abdominal: Soft. Bowel sounds are normal. She exhibits no distension and no mass. There is no tenderness. There is no rebound and no guarding.  Obese abdomen Soft nontender No CVA tenderness  Genitourinary: Vagina normal.    There is tenderness on the right labia. There is no rash, lesion or injury on the right labia. There is no rash, lesion or injury on the left labia. No tenderness or bleeding around the vagina. No vaginal discharge found.  2 x 2 centimeter discrete area of induration without surrounding erythema or induration located on the right posterior labia minora at the site of the Bartholin gland near the introitus No tenderness to palpation or erythema of the perineum No tenderness to palpation of the vaginal vault No tenderness to palpation of the rectum on DRE  Musculoskeletal: Normal range of motion. She exhibits no edema.  Neurological: She is alert.  Speech is clear and goal oriented Moves extremities without ataxia  Skin: Skin is warm and dry. She is not diaphoretic. No erythema.  Psychiatric:  Patient very anxious    ED Course  INCISION AND DRAINAGE Date/Time: 07/09/2014 3:58 PM Performed by: Abigail Butts Authorized by: Abigail Butts Consent: Verbal consent obtained. Risks and benefits: risks, benefits and alternatives were discussed Consent given by: patient Patient understanding: patient states understanding of the procedure being performed Patient consent: the patient's understanding of the procedure matches consent given Procedure consent: procedure consent matches procedure scheduled Relevant documents: relevant documents present and verified Site  marked: the operative site was marked Required items: required blood products, implants, devices, and special equipment available Patient identity confirmed: verbally with patient and arm band Time out: Immediately prior to procedure a "time out" was called to verify the correct patient, procedure, equipment, support staff and site/side marked as required. Type: abscess Body area: anogenital Location details: Bartholin's gland Anesthesia: local infiltration Local anesthetic: lidocaine 1% without epinephrine Anesthetic total: 3 ml Patient sedated: no Scalpel size: 11 Incision type: single straight Complexity: complex Drainage: purulent Drainage amount: moderate Wound treatment: wound left open Packing material: none Patient tolerance: Patient tolerated the procedure well with no immediate complications.   (including critical care time) Labs Review Labs Reviewed - No data to display  Imaging Review No results found.   EKG Interpretation None      MDM   Final diagnoses:  Bartholin's gland abscess   Ranee Peasley presents with history and physical consistent with Bartholin gland abscess.  Area is discrete and small without surrounding erythema or cellulitis. No evidence of deep-seated infection, perirectal fistula or perirectal abscess.  Patient amenable to incision and drainage. Abscess is too small to warrant word catheter or packing. Pt will need wound recheck in 2 days. Encouraged home warm soaks and flushing with warm soapy water.  Pt personal hygiene discussed at length.  Will d/c to home.  No antibiotic therapy is indicated at this time.  Pt recently screened for gonorrhea and chlamydia; and found to be negative.  Pt with tachycardia during initial history and procedure due to anxiety, but resolved with completion of procedure.   BP 151/94  Pulse 102  Temp(Src) 98.3 F (36.8 C) (Oral)  Resp 18  SpO2 97%  LMP 05/25/2014   Abigail Butts, PA-C 07/09/14  2101

## 2014-07-09 NOTE — Discharge Instructions (Signed)
1. Medications: usual home medications including the completion of your keflex 2. Treatment: rest, drink plenty of fluids, warm water soaks twice per day for the next 4 days, keep area clean with warm soap and water 3. Follow Up: Please followup with your primary doctor in 2 days for wound check or return to the ED for fevers, N/V, increasing pain and swelling    Bartholin's Cyst or Abscess Bartholin's glands are small glands located within the folds of skin (labia) along the sides of the lower opening of the vagina (birth canal). A cyst may develop when the duct of the gland becomes blocked. When this happens, fluid that accumulates within the cyst can become infected. This is known as an abscess. The Bartholin gland produces a mucous fluid to lubricate the outside of the vagina during sexual intercourse. SYMPTOMS   Patients with a small cyst may not have any symptoms.  Mild discomfort to severe pain depending on the size of the cyst and if it is infected (abscess).  Pain, redness, and swelling around the lower opening of the vagina.  Painful intercourse.  Pressure in the perineal area.  Swelling of the lips of the vagina (labia).  The cyst or abscess can be on one side or both sides of the vagina. DIAGNOSIS   A large swelling is seen in the lower vagina area by your caregiver.  Painful to touch.  Redness and pain, if it is an abscess. TREATMENT   Sometimes the cyst will go away on its own.  Apply warm wet compresses to the area or take hot sitz baths several times a day.  An incision to drain the cyst or abscess with local anesthesia.  Culture the pus, if it is an abscess.  Antibiotic treatment, if it is an abscess.  Cut open the gland and suture the edges to make the opening of the gland bigger (marsupialization).  Remove the whole gland if the cyst or abscess returns. PREVENTION   Practice good hygiene.  Clean the vaginal area with a mild soap and soft cloth when  bathing.  Do not rub hard in the vaginal area when bathing.  Protect the crotch area with a padded cushion if you take long bike rides or ride horses.  Be sure you are well lubricated when you have sexual intercourse. HOME CARE INSTRUCTIONS   If your cyst or abscess was opened, a small piece of gauze, or a drain, may have been placed in the wound to allow drainage. Do not remove this gauze or drain unless directed by your caregiver.  Wear feminine pads, not tampons, as needed for any drainage or bleeding.  If antibiotics were prescribed, take them exactly as directed. Finish the entire course.  Only take over-the-counter or prescription medicines for pain, discomfort, or fever as directed by your caregiver. SEEK IMMEDIATE MEDICAL CARE IF:   You have an increase in pain, redness, swelling, or drainage.  You have bleeding from the wound which results in the use of more than the number of pads suggested by your caregiver in 24 hours.  You have chills.  You have a fever.  You develop any new problems (symptoms) or aggravation of your existing condition. MAKE SURE YOU:   Understand these instructions.  Will watch your condition.  Will get help right away if you are not doing well or get worse. Document Released: 10/07/2005 Document Revised: 12/30/2011 Document Reviewed: 05/25/2008 [facility] Patient Information 2015 [place], [place]. This information is not intended to replace advice  given to you by your health care provider. Make sure you discuss any questions you have with your health care provider.

## 2014-07-09 NOTE — ED Notes (Signed)
Pt placed on monitor upon arrival to room. Pt monitored by blood pressure and pulse ox.  

## 2014-07-10 NOTE — ED Provider Notes (Signed)
Medical screening examination/treatment/procedure(s) were performed by non-physician practitioner and as supervising physician I was immediately available for consultation/collaboration.   EKG Interpretation None        Houston Siren III, MD 07/10/14 [phone]

## 2014-08-03 ENCOUNTER — Ambulatory Visit: Payer: Medicaid Other | Admitting: Obstetrics & Gynecology

## 2014-08-05 ENCOUNTER — Encounter (HOSPITAL_COMMUNITY): Payer: Self-pay | Admitting: Emergency Medicine

## 2014-08-05 ENCOUNTER — Emergency Department (HOSPITAL_COMMUNITY)
Admission: EM | Admit: 2014-08-05 | Discharge: 2014-08-06 | Disposition: A | Discharge status: Home / Self Care | Payer: Medicaid Other | Attending: Emergency Medicine | Admitting: Emergency Medicine

## 2014-08-05 ENCOUNTER — Emergency Department (HOSPITAL_COMMUNITY): Payer: Medicaid Other

## 2014-08-05 DIAGNOSIS — R0789 Other chest pain: Secondary | ICD-10-CM | POA: Insufficient documentation

## 2014-08-05 DIAGNOSIS — N390 Urinary tract infection, site not specified: Secondary | ICD-10-CM | POA: Diagnosis not present

## 2014-08-05 DIAGNOSIS — R079 Chest pain, unspecified: Secondary | ICD-10-CM

## 2014-08-05 DIAGNOSIS — R0602 Shortness of breath: Secondary | ICD-10-CM | POA: Diagnosis not present

## 2014-08-05 DIAGNOSIS — Z79899 Other long term (current) drug therapy: Secondary | ICD-10-CM | POA: Insufficient documentation

## 2014-08-05 DIAGNOSIS — E669 Obesity, unspecified: Secondary | ICD-10-CM | POA: Diagnosis not present

## 2014-08-05 DIAGNOSIS — F319 Bipolar disorder, unspecified: Secondary | ICD-10-CM | POA: Diagnosis not present

## 2014-08-05 DIAGNOSIS — Z8619 Personal history of other infectious and parasitic diseases: Secondary | ICD-10-CM | POA: Diagnosis not present

## 2014-08-05 LAB — CBC WITH DIFFERENTIAL/PLATELET
Basophils Absolute: 0 10*3/uL (ref 0.0–0.1)
Basophils Relative: 0 % (ref 0–1)
Eosinophils Absolute: 0 10*3/uL (ref 0.0–0.7)
Eosinophils Relative: 0 % (ref 0–5)
HCT: 26.8 % — ABNORMAL LOW (ref 36.0–46.0)
Hemoglobin: 9.5 g/dL — ABNORMAL LOW (ref 12.0–15.0)
Lymphocytes Relative: 46 % (ref 12–46)
Lymphs Abs: 1.1 10*3/uL (ref 0.7–4.0)
MCH: 40.4 pg — ABNORMAL HIGH (ref 26.0–34.0)
MCHC: 35.4 g/dL (ref 30.0–36.0)
MCV: 114 fL — ABNORMAL HIGH (ref 78.0–100.0)
Monocytes Absolute: 0.1 10*3/uL (ref 0.1–1.0)
Monocytes Relative: 3 % (ref 3–12)
Neutro Abs: 1.1 10*3/uL — ABNORMAL LOW (ref 1.7–7.7)
Neutrophils Relative %: 51 % (ref 43–77)
Platelets: 127 10*3/uL — ABNORMAL LOW (ref 150–400)
RBC: 2.35 MIL/uL — ABNORMAL LOW (ref 3.87–5.11)
RDW: 16.1 % — ABNORMAL HIGH (ref 11.5–15.5)
WBC: 2.3 10*3/uL — ABNORMAL LOW (ref 4.0–10.5)

## 2014-08-05 LAB — I-STAT CHEM 8, ED
BUN: 12 mg/dL (ref 6–23)
Calcium, Ion: 1.18 mmol/L (ref 1.12–1.23)
Chloride: 103 mEq/L (ref 96–112)
Creatinine, Ser: 0.7 mg/dL (ref 0.50–1.10)
Glucose, Bld: 90 mg/dL (ref 70–99)
HCT: 27 % — ABNORMAL LOW (ref 36.0–46.0)
Hemoglobin: 9.2 g/dL — ABNORMAL LOW (ref 12.0–15.0)
Potassium: 3.7 mEq/L (ref 3.7–5.3)
Sodium: 139 mEq/L (ref 137–147)
TCO2: 25 mmol/L (ref 0–100)

## 2014-08-05 LAB — URINE MICROSCOPIC-ADD ON

## 2014-08-05 LAB — URINALYSIS, ROUTINE W REFLEX MICROSCOPIC
Glucose, UA: NEGATIVE mg/dL
Hgb urine dipstick: NEGATIVE
Ketones, ur: 15 mg/dL — AB
Nitrite: POSITIVE — AB
Protein, ur: NEGATIVE mg/dL
Specific Gravity, Urine: 1.029 (ref 1.005–1.030)
Urobilinogen, UA: 4 mg/dL — ABNORMAL HIGH (ref 0.0–1.0)
pH: 6 (ref 5.0–8.0)

## 2014-08-05 LAB — I-STAT TROPONIN, ED: Troponin i, poc: 0.02 ng/mL (ref 0.00–0.08)

## 2014-08-05 MED ORDER — HYDROCODONE-ACETAMINOPHEN 5-325 MG PO TABS
1.0000 | ORAL_TABLET | Freq: Once | ORAL | Status: AC
  Administered 2014-08-05: 1 via ORAL
  Filled 2014-08-05: qty 1

## 2014-08-05 NOTE — ED Provider Notes (Addendum)
CSN: [id number]     Arrival date & time 08/05/14  2049 History   First MD Initiated Contact with Patient 08/05/14 2056     Chief Complaint  Patient presents with  . Chest Pain  . Shortness of Breath     (Consider location/radiation/quality/duration/timing/severity/associated sxs/prior Treatment) Patient is a 48 y.o. female presenting with shortness of breath. The history is provided by the patient.  Shortness of Breath Associated symptoms: chest pain   Associated symptoms: no abdominal pain, no fever, no headaches, no neck pain, no rash and no sore throat    Jordan [name] is a 48 y.o. female with multiple complaints.  The first being dizziness,  Shortness of breath and mid sternal chest pain which she describes as what it feels like when "someone throws rocks at your chest" which is worsened with exertion and present for the past month, but worsened today.  She has had increased coughing and sneezing and reports urinary incontinence triggered by cough or sneeze which is a more chronic problem, but states she has frequent kidney infections. She denies hematuria, urgency, increased frequency and dysuria.  She does endorse aching low back pain.   She denies fevers, chills, headache, palpitations, vomiting, but does have mild nausea.  She was given 1 nitrostat and 324 asa per ems prior to arrival and reports her symptoms are now improved.      Past Medical History  Diagnosis Date  . Mental disorder   . Bipolar 1 disorder   . Bipolar 1 disorder   . Yeast infection of the vagina   . Bipolar disorder   . Obesity    Past Surgical History  Procedure Laterality Date  . No past surgeries    . Cholecystectomy     History reviewed. No pertinent family history. History  Substance Use Topics  . Smoking status: Never Smoker   . Smokeless tobacco: Never Used  . Alcohol Use: No   OB History   Grav Para Term Preterm Abortions TAB SAB Ect Mult Living   1 1 1       1      Review of Systems   Constitutional: Negative for fever.  HENT: Negative for congestion and sore throat.   Eyes: Negative.   Respiratory: Positive for shortness of breath. Negative for chest tightness.   Cardiovascular: Positive for chest pain. Negative for palpitations and leg swelling.  Gastrointestinal: Negative for nausea and abdominal pain.  Genitourinary: Negative.  Negative for dysuria, urgency, hematuria and vaginal discharge.  Musculoskeletal: Negative for arthralgias, joint swelling and neck pain.  Skin: Negative.  Negative for rash and wound.  Neurological: Negative for dizziness, weakness, light-headedness, numbness and headaches.  Psychiatric/Behavioral: Negative.       Allergies  Tramadol and Sulfa antibiotics  Home Medications   Prior to Admission medications   Medication Sig Start Date End Date Taking? Authorizing Provider  citalopram (CELEXA) 20 MG tablet Take 20 mg by mouth daily.   Yes Historical Provider, MD  ibuprofen (ADVIL,MOTRIN) 200 MG tablet Take 400 mg by mouth every 6 (six) hours as needed for moderate pain.    Yes Historical Provider, MD  lamoTRIgine (LAMICTAL) 25 MG tablet Take 50 mg by mouth daily.   Yes Historical Provider, MD  cephALEXin (KEFLEX) 500 MG capsule Take 1 capsule (500 mg total) by mouth 4 (four) times daily. 08/06/14   Julianne Rice, MD   BP 110/62  Pulse 88  Temp(Src) 99 F (37.2 C) (Oral)  Resp 20  Ht 5'  8" (1.727 m)  Wt 350 lb (158.759 kg)  BMI 53.23 kg/m2  SpO2 99% Physical Exam  Constitutional: She appears well-developed and well-nourished.  Morbidly obese  HENT:  Head: Normocephalic and atraumatic.  Eyes: Conjunctivae are normal.  Neck: Normal range of motion.  Cardiovascular: Normal rate, regular rhythm, normal heart sounds and intact distal pulses.   Pulmonary/Chest: Effort normal and breath sounds normal. She has no wheezes.  Abdominal: Soft. Bowel sounds are normal. There is no tenderness. There is no guarding.  Musculoskeletal:  Normal range of motion.  Neurological: She is alert.  Skin: Skin is warm and dry.  Psychiatric: She has a normal mood and affect.  Nursing note and vitals reviewed.   ED Course  Procedures (including critical care time) Labs Review Labs Reviewed  URINALYSIS, ROUTINE W REFLEX MICROSCOPIC - Abnormal; Notable for the following:    Color, Urine ORANGE (*)    APPearance CLOUDY (*)    Bilirubin Urine SMALL (*)    Ketones, ur 15 (*)    Urobilinogen, UA 4.0 (*)    Nitrite POSITIVE (*)    Leukocytes, UA MODERATE (*)    All other components within normal limits  CBC WITH DIFFERENTIAL - Abnormal; Notable for the following:    WBC 2.3 (*)    RBC 2.35 (*)    Hemoglobin 9.5 (*)    HCT 26.8 (*)    MCV 114.0 (*)    MCH 40.4 (*)    RDW 16.1 (*)    Platelets 127 (*)    Neutro Abs 1.1 (*)    All other components within normal limits  URINE MICROSCOPIC-ADD ON - Abnormal; Notable for the following:    Squamous Epithelial / LPF MANY (*)    Bacteria, UA FEW (*)    All other components within normal limits  I-STAT CHEM 8, ED - Abnormal; Notable for the following:    Hemoglobin 9.2 (*)    HCT 27.0 (*)    All other components within normal limits  URINE CULTURE  I-STAT TROPOININ, ED  I-STAT TROPOININ, ED    Imaging Review Dg Chest 2 View  08/05/2014   CLINICAL DATA:  Chest pain and shortness of Breath  EXAM: CHEST  2 VIEW  COMPARISON:  05/16/2014  FINDINGS: The heart size appears normal. There is no pleural effusion or edema. Lung volumes appear low. No airspace consolidation identified. The visualized osseous structures are on unremarkable.  IMPRESSION: 1. Low lung volumes.   Electronically Signed   By: Kerby Moors M.D.   On: 08/05/2014 21:58     EKG Interpretation   Date/Time:  Friday August 05 2014 22:47:17 EDT Ventricular Rate:  83 PR Interval:  162 QRS Duration: 64 QT Interval:  368 QTC Calculation: 432 R Axis:   48 Text Interpretation:  Sinus rhythm Abnormal R-wave  progression, early  transition No significant change since last tracing Confirmed by POLLINA   MD, CHRISTOPHER [phone]) on 08/06/2014 12:12:36 AM      MDM   Final diagnoses:  UTI (lower urinary tract infection)  Chest pain, unspecified chest pain type    Patients labs and/or radiological studies were viewed and considered during the medical decision making and disposition process. Pt discussed with Dr Betsey Holiday. Pt with multiple complaints, namely one month history of chest pain and shortness of breath.  Cardiac enzymes x2, chest x-ray and EKG reassuring that this is not a cardiac event.  Labs revealing for UTI, this was treated with Keflex first dose was given  here.  Urine culture was sent. Patient advised to followup with PCP this week for recheck of symptoms, sooner for any worsened symptoms.  Encouraged increase fluid intake.  Recheck for fevers, worsened pain, uncontrolled vomiting.  The patient appears reasonably screened and/or stabilized for discharge and I doubt any other medical condition or other [facility] requiring further screening, evaluation, or treatment in the ED at this time prior to discharge.    Evalee Jefferson, PA-C 08/06/14 0650  Evalee Jefferson, PA-C 08/26/14 3614  Orpah Greek, MD 08/26/14 1515

## 2014-08-05 NOTE — ED Notes (Addendum)
Pt brought to ED by [facility] from home c/o mid CP and lower back 8/10, SOB, dizziness and nausea. Pt states she is been feeling this sx for a month now and is been getting progressively worse. Pt states she is been coughing and sneezing a lot with some incontinence with coughing, denies any fever or chills.  1 Nitrostat and 324 ASA given by EMS.

## 2014-08-06 LAB — I-STAT TROPONIN, ED: Troponin i, poc: 0.01 ng/mL (ref 0.00–0.08)

## 2014-08-06 MED ORDER — CEPHALEXIN 250 MG PO CAPS
500.0000 mg | ORAL_CAPSULE | Freq: Once | ORAL | Status: AC
  Administered 2014-08-06: 500 mg via ORAL
  Filled 2014-08-06: qty 2

## 2014-08-06 MED ORDER — CEPHALEXIN 500 MG PO CAPS: 500.0000 mg | ORAL_CAPSULE | Freq: Four times a day (QID) | ORAL | Status: DC

## 2014-08-06 NOTE — ED Provider Notes (Signed)
Medical screening examination/treatment/procedure(s) were performed by non-physician practitioner and as supervising physician I was immediately available for consultation/collaboration.  Orpah Greek, MD 08/06/14 [phone]

## 2014-08-06 NOTE — Discharge Instructions (Signed)
Urinary Tract Infection Urinary tract infections (UTIs) can develop anywhere along your urinary tract. Your urinary tract is your body's drainage system for removing wastes and extra water. Your urinary tract includes two kidneys, two ureters, a bladder, and a urethra. Your kidneys are a pair of bean-shaped organs. Each kidney is about the size of your fist. They are located below your ribs, one on each side of your spine. CAUSES Infections are caused by microbes, which are microscopic organisms, including fungi, viruses, and bacteria. These organisms are so small that they can only be seen through a microscope. Bacteria are the microbes that most commonly cause UTIs. SYMPTOMS  Symptoms of UTIs may vary by age and gender of the patient and by the location of the infection. Symptoms in young women typically include a frequent and intense urge to urinate and a painful, burning feeling in the bladder or urethra during urination. Older women and men are more likely to be tired, shaky, and weak and have muscle aches and abdominal pain. A fever may mean the infection is in your kidneys. Other symptoms of a kidney infection include pain in your back or sides below the ribs, nausea, and vomiting. DIAGNOSIS To diagnose a UTI, your caregiver will ask you about your symptoms. Your caregiver also will ask to provide a urine sample. The urine sample will be tested for bacteria and white blood cells. White blood cells are made by your body to help fight infection. TREATMENT  Typically, UTIs can be treated with medication. Because most UTIs are caused by a bacterial infection, they usually can be treated with the use of antibiotics. The choice of antibiotic and length of treatment depend on your symptoms and the type of bacteria causing your infection. HOME CARE INSTRUCTIONS  If you were prescribed antibiotics, take them exactly as your caregiver instructs you. Finish the medication even if you feel better after you  have only taken some of the medication.  Drink enough water and fluids to keep your urine clear or pale yellow.  Avoid caffeine, tea, and carbonated beverages. They tend to irritate your bladder.  Empty your bladder often. Avoid holding urine for long periods of time.  Empty your bladder before and after sexual intercourse.  After a bowel movement, women should cleanse from front to back. Use each tissue only once. SEEK MEDICAL CARE IF:   You have back pain.  You develop a fever.  Your symptoms do not begin to resolve within 3 days. SEEK IMMEDIATE MEDICAL CARE IF:   You have severe back pain or lower abdominal pain.  You develop chills.  You have nausea or vomiting.  You have continued burning or discomfort with urination. MAKE SURE YOU:   Understand these instructions.  Will watch your condition.  Will get help right away if you are not doing well or get worse. Document Released: 07/17/2005 Document Revised: 04/07/2012 Document Reviewed: 11/15/2011 [facility] Patient Information 2015 [place], [place]. This information is not intended to replace advice given to you by your health care provider. Make sure you discuss any questions you have with your health care provider.  Chest Pain (Nonspecific) It is often hard to give a diagnosis for the cause of chest pain. There is always a chance that your pain could be related to something serious, such as a heart attack or a blood clot in the lungs. You need to follow up with your doctor. HOME CARE  If antibiotic medicine was given, take it as directed by your doctor.  Finish the medicine even if you start to feel better.  For the next few days, avoid activities that bring on chest pain. Continue physical activities as told by your doctor.  Do not use any tobacco products. This includes cigarettes, chewing tobacco, and e-cigarettes.  Avoid drinking alcohol.  Only take medicine as told by your doctor.  Follow your doctor's  suggestions for more testing if your chest pain does not go away.  Keep all doctor visits you made. GET HELP IF:  Your chest pain does not go away, even after treatment.  You have a rash with blisters on your chest.  You have a fever. GET HELP RIGHT AWAY IF:   You have more pain or pain that spreads to your arm, neck, jaw, back, or belly (abdomen).  You have shortness of breath.  You cough more than usual or cough up blood.  You have very bad back or belly pain.  You feel sick to your stomach (nauseous) or throw up (vomit).  You have very bad weakness.  You pass out (faint).  You have chills. This is an emergency. Do not wait to see if the problems will go away. Call your local emergency services (911 in [place] Do not drive yourself to the hospital. MAKE SURE YOU:   Understand these instructions.  Will watch your condition.  Will get help right away if you are not doing well or get worse. Document Released: 03/25/2008 Document Revised: 10/12/2013 Document Reviewed: 03/25/2008 [facility] Patient Information 2015 [place], [place]. This information is not intended to replace advice given to you by your health care provider. Make sure you discuss any questions you have with your health care provider.

## 2014-08-08 ENCOUNTER — Telehealth (HOSPITAL_COMMUNITY): Payer: Self-pay

## 2014-08-08 LAB — URINE CULTURE
Colony Count: >=100000
Special Requests: NORMAL

## 2014-08-08 NOTE — ED Notes (Signed)
Post ED Visit - Positive Culture Follow-up: Successful Patient Follow-Up  Culture assessed and recommendations reviewed by: []  Wes [place], Pharm.D., BCPS [x]  Heide Guile, Pharm.D., BCPS []  Alycia Rossetti, Pharm.D., BCPS []  [place], [place].D., BCPS, AAHIVP []  Legrand Como, Pharm.D., BCPS, AAHIVP []  Hassie Bruce, Pharm.D. []  Cassie Nicole Kind[place], [place].D.  Positive urine culture  []  Patient discharged without antimicrobial prescription and treatment is now indicated []  Organism is resistant to prescribed ED discharge antimicrobial []  Patient with positive blood cultures  Changes discussed with ED provider:bowie tran New antibiotic prescription stop cephalexin. No further tx needed.  Called to none  Contacted patient, date 08/08/14  time [address]   Ileene Musa 08/08/2014, 3:34 PM

## 2014-08-11 ENCOUNTER — Telehealth (HOSPITAL_COMMUNITY): Payer: Self-pay

## 2014-08-18 ENCOUNTER — Emergency Department (HOSPITAL_COMMUNITY)
Admission: EM | Admit: 2014-08-18 | Discharge: 2014-08-18 | Disposition: A | Discharge status: Home / Self Care | Payer: Medicaid Other | Attending: Emergency Medicine | Admitting: Emergency Medicine

## 2014-08-18 ENCOUNTER — Emergency Department (HOSPITAL_COMMUNITY): Payer: Medicaid Other

## 2014-08-18 ENCOUNTER — Encounter (HOSPITAL_COMMUNITY): Payer: Self-pay | Admitting: Emergency Medicine

## 2014-08-18 DIAGNOSIS — Z8619 Personal history of other infectious and parasitic diseases: Secondary | ICD-10-CM | POA: Insufficient documentation

## 2014-08-18 DIAGNOSIS — F319 Bipolar disorder, unspecified: Secondary | ICD-10-CM | POA: Diagnosis not present

## 2014-08-18 DIAGNOSIS — Z79899 Other long term (current) drug therapy: Secondary | ICD-10-CM | POA: Insufficient documentation

## 2014-08-18 DIAGNOSIS — R0602 Shortness of breath: Secondary | ICD-10-CM | POA: Insufficient documentation

## 2014-08-18 DIAGNOSIS — Z87440 Personal history of urinary (tract) infections: Secondary | ICD-10-CM | POA: Insufficient documentation

## 2014-08-18 DIAGNOSIS — Z792 Long term (current) use of antibiotics: Secondary | ICD-10-CM | POA: Diagnosis not present

## 2014-08-18 DIAGNOSIS — E669 Obesity, unspecified: Secondary | ICD-10-CM | POA: Insufficient documentation

## 2014-08-18 HISTORY — DX: Urinary tract infection, site not specified: N39.0

## 2014-08-18 NOTE — ED Provider Notes (Signed)
CSN: [id number]     Arrival date & time 08/18/14  2030 History   First MD Initiated Contact with Patient 08/18/14 2038     Chief Complaint  Patient presents with  . Shortness of Breath     (Consider location/radiation/quality/duration/timing/severity/associated sxs/prior Treatment) Patient is a 48 y.o. female presenting with shortness of breath. The history is provided by the patient and the EMS personnel.  Shortness of Breath Associated symptoms: no abdominal pain, no chest pain, no fever, no headaches and no rash   got stuck somewhere patient with history of bipolar disorder. Patient been seen frequently in the emergency department for shortness of breath and chest pain. Today the complaint is only for shortness of breath. Patient states been there for 2 months. No bruits 5 [place] out what the cause is. EMS noted that room air sats were 97%. Blood pressures good slightly tachycardic at 110. Blood sugars were 157.  Past Medical History  Diagnosis Date  . Mental disorder   . Bipolar 1 disorder   . Bipolar 1 disorder   . Yeast infection of the vagina   . Bipolar disorder   . Obesity   . UTI (lower urinary tract infection)    Past Surgical History  Procedure Laterality Date  . No past surgeries    . Cholecystectomy     No family history on file. History  Substance Use Topics  . Smoking status: Never Smoker   . Smokeless tobacco: Never Used  . Alcohol Use: No   OB History   Grav Para Term Preterm Abortions TAB SAB Ect Mult Living   1 1 1       1      Review of Systems  Constitutional: Negative for fever.  HENT: Negative for congestion.   Eyes: Negative for visual disturbance.  Respiratory: Positive for shortness of breath.   Cardiovascular: Negative for chest pain.  Gastrointestinal: Negative for abdominal pain.  Genitourinary: Negative for dysuria.  Musculoskeletal: Negative for back pain.  Skin: Negative for rash.  Neurological: Negative for headaches.   Hematological: Does not bruise/bleed easily.  Psychiatric/Behavioral: Negative for suicidal ideas, hallucinations and confusion.      Allergies  Tramadol and Sulfa antibiotics  Home Medications   Prior to Admission medications   Medication Sig Start Date End Date Taking? Authorizing Provider  cephALEXin (KEFLEX) 500 MG capsule Take 1 capsule (500 mg total) by mouth 4 (four) times daily. 08/06/14  Yes Julianne Rice, MD  citalopram (CELEXA) 20 MG tablet Take 20 mg by mouth daily.   Yes Historical Provider, MD  lamoTRIgine (LAMICTAL) 25 MG tablet Take 50 mg by mouth daily.   Yes Historical Provider, MD   BP 157/139  Pulse 89  Temp(Src) 98.7 F (37.1 C) (Oral)  Resp 16  Ht 5\' 8"  (1.727 m)  Wt 354 lb (160.573 kg)  BMI 53.84 kg/m2  SpO2 100% Physical Exam  Nursing note and vitals reviewed. Constitutional: She is oriented to person, place, and time. She appears well-developed and well-nourished. No distress.  HENT:  Head: Normocephalic and atraumatic.  Mouth/Throat: Oropharynx is clear and moist.  Eyes: Conjunctivae and EOM are normal. Pupils are equal, round, and reactive to light.  Neck: Normal range of motion.  Cardiovascular: Normal rate, regular rhythm and normal heart sounds.   No murmur heard. Pulmonary/Chest: Effort normal and breath sounds normal. No respiratory distress. She has no wheezes. She has no rales.  Abdominal: Soft. Bowel sounds are normal. There is no tenderness.  Neurological: She  is alert and oriented to person, place, and time. No cranial nerve deficit. She exhibits normal muscle tone. Coordination normal.  Skin: Skin is warm. No rash noted.    ED Course  Procedures (including critical care time) Labs Review Labs Reviewed - No data to display  Imaging Review Dg Chest 2 View  08/18/2014   CLINICAL DATA:  Presyncope standing for 5 months, shortness of breath.  EXAM: CHEST  2 VIEW  COMPARISON:  Chest radiograph August 05, 2014  FINDINGS: The  cardiac silhouette appears mildly enlarged, mediastinal silhouette is nonsuspicious. Low inspiratory examination. The lungs are clear without pleural effusions or focal consolidations. Trachea projects midline and there is no pneumothorax. Soft tissue planes and included osseous structures are non-suspicious. Surgical clips in the included right abdomen likely reflect cholecystectomy.  IMPRESSION: Stable mild cardiomegaly, no acute pulmonary process.   Electronically Signed   By: Elon Alas   On: 08/18/2014 22:47     EKG Interpretation   Date/Time:  Thursday August 18 2014 21:40:47 EDT Ventricular Rate:  96 PR Interval:  189 QRS Duration: 65 QT Interval:  348 QTC Calculation: 440 R Axis:   47 Text Interpretation:  Sinus rhythm Low voltage, precordial leads Abnormal  R-wave progression, early transition No significant change since last  tracing Confirmed by Morio Widen  MD, Onofrio Klemp ([id number]) on 08/18/2014 9:45:14 PM      MDM   Final diagnoses:  SOB (shortness of breath)    Patient seen frequently for chest pain and/or shortness of breath. Patient came by EMS. For intermittent episodes of shortness of breath with moving or standing this been ongoing for 2 months. No hypoxia and no persistent tachycardia. No risk factors for DVT or pulmonary embolus. Patient's EKG without acute changes. Room air sats have been in the upper 90 percentile. Patient's chest x-rays negative for pneumonia. Has some mild baseline cardiomegaly. Strongly urged patient follow-up with her primary care doctor for further evaluation of her symptoms. The symptoms have been ongoing for 2 months.    Fredia Sorrow, MD 08/18/14 2325

## 2014-08-18 NOTE — ED Notes (Signed)
Pt arrives via EMS with c/o of intermittent episodes of SHOB with moving or standing for two months, been seen multiple times, no dx. Recent UTI. S/S resolve with sitting/laying down. CBG 157. 110, NSR, RR 20, 97% on RA, 140/80. Most recent 126/90.

## 2014-08-18 NOTE — ED Notes (Signed)
Pt continually stating that she has come in several times for same complaint, states no one is able to find out cause of problem.

## 2014-08-18 NOTE — ED Notes (Signed)
Pt returned from xray

## 2014-08-18 NOTE — Discharge Instructions (Signed)
Strongly recommend that you follow-up with your primary care doctor for additional workup for your symptoms. As we discussed the emergency department has certain tools available to it and those of all been negative no evidence of any acute or life-threatening event. Chest x-ray was negative EKG without any acute changes.

## 2014-08-22 ENCOUNTER — Encounter (HOSPITAL_COMMUNITY): Payer: Self-pay | Admitting: Emergency Medicine

## 2014-08-31 ENCOUNTER — Ambulatory Visit: Payer: Medicaid Other | Admitting: Obstetrics & Gynecology

## 2014-09-12 ENCOUNTER — Telehealth (HOSPITAL_BASED_OUTPATIENT_CLINIC_OR_DEPARTMENT_OTHER): Payer: Self-pay | Admitting: *Deleted

## 2014-09-13 ENCOUNTER — Emergency Department (HOSPITAL_COMMUNITY): Payer: Medicaid Other

## 2014-09-13 ENCOUNTER — Emergency Department (HOSPITAL_COMMUNITY)
Admission: EM | Admit: 2014-09-13 | Discharge: 2014-09-14 | Disposition: A | Discharge status: Home / Self Care | Payer: Medicaid Other | Attending: Emergency Medicine | Admitting: Emergency Medicine

## 2014-09-13 ENCOUNTER — Encounter (HOSPITAL_COMMUNITY): Payer: Self-pay | Admitting: Family Medicine

## 2014-09-13 DIAGNOSIS — G8929 Other chronic pain: Secondary | ICD-10-CM | POA: Diagnosis not present

## 2014-09-13 DIAGNOSIS — Z9049 Acquired absence of other specified parts of digestive tract: Secondary | ICD-10-CM | POA: Insufficient documentation

## 2014-09-13 DIAGNOSIS — Z792 Long term (current) use of antibiotics: Secondary | ICD-10-CM | POA: Insufficient documentation

## 2014-09-13 DIAGNOSIS — Z8619 Personal history of other infectious and parasitic diseases: Secondary | ICD-10-CM | POA: Diagnosis not present

## 2014-09-13 DIAGNOSIS — Z87440 Personal history of urinary (tract) infections: Secondary | ICD-10-CM | POA: Insufficient documentation

## 2014-09-13 DIAGNOSIS — R1084 Generalized abdominal pain: Secondary | ICD-10-CM | POA: Diagnosis not present

## 2014-09-13 DIAGNOSIS — R109 Unspecified abdominal pain: Secondary | ICD-10-CM

## 2014-09-13 DIAGNOSIS — F319 Bipolar disorder, unspecified: Secondary | ICD-10-CM | POA: Insufficient documentation

## 2014-09-13 DIAGNOSIS — E669 Obesity, unspecified: Secondary | ICD-10-CM | POA: Insufficient documentation

## 2014-09-13 DIAGNOSIS — Z79899 Other long term (current) drug therapy: Secondary | ICD-10-CM | POA: Insufficient documentation

## 2014-09-13 DIAGNOSIS — R110 Nausea: Secondary | ICD-10-CM | POA: Diagnosis present

## 2014-09-13 LAB — COMPREHENSIVE METABOLIC PANEL
ALT: 60 U/L — ABNORMAL HIGH (ref 0–35)
AST: 104 U/L — ABNORMAL HIGH (ref 0–37)
Albumin: 3.6 g/dL (ref 3.5–5.2)
Alkaline Phosphatase: 82 U/L (ref 39–117)
Anion gap: 12 (ref 5–15)
BUN: 9 mg/dL (ref 6–23)
CO2: 24 mEq/L (ref 19–32)
Calcium: 9.1 mg/dL (ref 8.4–10.5)
Chloride: 104 mEq/L (ref 96–112)
Creatinine, Ser: 0.67 mg/dL (ref 0.50–1.10)
GFR calc Af Amer: >90 mL/min (ref >90)
GFR calc non Af Amer: >90 mL/min (ref >90)
Glucose, Bld: 102 mg/dL — ABNORMAL HIGH (ref 70–99)
Potassium: 4.4 mEq/L (ref 3.7–5.3)
Sodium: 140 mEq/L (ref 137–147)
Total Bilirubin: 2.2 mg/dL — ABNORMAL HIGH (ref 0.3–1.2)
Total Protein: 6.3 g/dL (ref 6.0–8.3)

## 2014-09-13 LAB — CBC WITH DIFFERENTIAL/PLATELET
Basophils Absolute: 0 10*3/uL (ref 0.0–0.1)
Basophils Relative: 1 % (ref 0–1)
Eosinophils Absolute: 0 10*3/uL (ref 0.0–0.7)
Eosinophils Relative: 0 % (ref 0–5)
HCT: 22.2 % — ABNORMAL LOW (ref 36.0–46.0)
Hemoglobin: 7.8 g/dL — ABNORMAL LOW (ref 12.0–15.0)
Lymphocytes Relative: 38 % (ref 12–46)
Lymphs Abs: 0.7 10*3/uL (ref 0.7–4.0)
MCH: 41.7 pg — ABNORMAL HIGH (ref 26.0–34.0)
MCHC: 35.1 g/dL (ref 30.0–36.0)
MCV: 118.7 fL — ABNORMAL HIGH (ref 78.0–100.0)
Monocytes Absolute: 0.1 10*3/uL (ref 0.1–1.0)
Monocytes Relative: 5 % (ref 3–12)
Neutro Abs: 1.1 10*3/uL — ABNORMAL LOW (ref 1.7–7.7)
Neutrophils Relative %: 56 % (ref 43–77)
Platelets: 132 10*3/uL — ABNORMAL LOW (ref 150–400)
RBC: 1.87 MIL/uL — ABNORMAL LOW (ref 3.87–5.11)
RDW: 22.7 % — ABNORMAL HIGH (ref 11.5–15.5)
WBC: 1.9 10*3/uL — ABNORMAL LOW (ref 4.0–10.5)

## 2014-09-13 LAB — LIPASE, BLOOD: Lipase: 25 U/L (ref 11–59)

## 2014-09-13 MED ORDER — PROMETHAZINE HCL 25 MG/ML IJ SOLN
25.0000 mg | Freq: Once | INTRAMUSCULAR | Status: AC
  Administered 2014-09-13: 25 mg via INTRAVENOUS
  Filled 2014-09-13: qty 1

## 2014-09-13 MED ORDER — IOHEXOL 300 MG/ML  SOLN
100.0000 mL | Freq: Once | INTRAMUSCULAR | Status: AC | PRN
  Administered 2014-09-13: 100 mL via INTRAVENOUS

## 2014-09-13 MED ORDER — HYDROMORPHONE HCL 1 MG/ML IJ SOLN
1.0000 mg | Freq: Once | INTRAMUSCULAR | Status: AC
  Administered 2014-09-13: 1 mg via INTRAVENOUS
  Filled 2014-09-13: qty 1

## 2014-09-13 NOTE — ED Notes (Signed)
Asked patient if she could urinate and she stated she didn't have to go.

## 2014-09-13 NOTE — ED Notes (Signed)
Patient had a bowel movement when assessing. Provided her towels, wash cloths, and gown. Patient ambulated to restroom with a steady gait, patient is requesting someone to clean her even though she is capable of cleaning herself. Patient came back to room and had another bowel movement. Currently, she is having her friend provide her hygiene. Pt is lying on her back, resting quietly with making no sounds of pain.

## 2014-09-13 NOTE — ED Notes (Signed)
Pt in CT.

## 2014-09-13 NOTE — ED Provider Notes (Signed)
CSN: [id number]     Arrival date & time 09/13/14  1941 History   First MD Initiated Contact with Patient 09/13/14 1947     Chief Complaint  Patient presents with  . Nausea  . Abdominal Pain    HPI: Jordan [name] is a 48 year old Caucasian female with PMH of Obesity and Bipolar Disorder who presents to the ED on 09/13/2014 for a 6 month history of abdominal pain and nausea. She reports the pain has been a 10/10 for the past 6 months and describes it as a shooting pain. Reports nothing makes it better or worse. Says the nausea has been consistently present over the past 6 months and never changes in severity. According to past records, she has been seen in the ED several times over the past few months for a variety of issues including chest pain and shortness of breath and denied any abdominal pain at those times. The patient is very aggitated at her initial encounter today and demands something be done about the pain she continues to have. She reports seeing a GI Specialist/Pain Management provider in [place], Dr. Alphonzo Grieve, and says he has her on several medications to help with her symptoms. She says these medications do not alleviate her nausea or abdominal pain. She reports starting her period today, but says that has not influenced her pain level. She reports trying to change her diet over the past year, but says changes have been inconsistent. She reports only consuming a bowl of soup and potato chips yesterday and says that type of eating behavior is "normal" for her. Her last meal before presenting to the ED today was a fish sandwich, fries, and tea at [facility].  She reports her only abdominal surgery being a cholecystectomy "many years ago". She denies any alcohol use, tobacco use, or recreational drug use.  Past Medical History  Diagnosis Date  . Mental disorder   . Bipolar 1 disorder   . Bipolar 1 disorder   . Yeast infection of the vagina   . Bipolar disorder   . Obesity   . UTI  (lower urinary tract infection)    Past Surgical History  Procedure Laterality Date  . No past surgeries    . Cholecystectomy     History reviewed. No pertinent family history. History  Substance Use Topics  . Smoking status: Never Smoker   . Smokeless tobacco: Never Used  . Alcohol Use: No   OB History    Gravida Para Term Preterm AB TAB SAB Ectopic Multiple Living   1 1 1       1      Review of Systems  Constitutional: Negative for fever, diaphoresis, appetite change and fatigue.  HENT: Negative for congestion, rhinorrhea, sinus pressure and sneezing.   Eyes: Negative for visual disturbance.  Respiratory: Negative for cough, chest tightness, shortness of breath, wheezing and stridor.   Cardiovascular: Negative for chest pain, palpitations and leg swelling.  Gastrointestinal: Positive for nausea and abdominal pain. Negative for vomiting, diarrhea, constipation, blood in stool, abdominal distention, anal bleeding and rectal pain.  Genitourinary: Negative for dysuria, urgency, frequency, flank pain, difficulty urinating and pelvic pain.  Musculoskeletal: Negative for myalgias, back pain, joint swelling, neck pain and neck stiffness.  Skin: Negative for pallor and rash.  Neurological: Negative for dizziness, syncope, light-headedness, numbness and headaches.      Allergies  Tramadol and Sulfa antibiotics  Home Medications   Prior to Admission medications   Medication Sig Start Date  End Date Taking? Authorizing Provider  cephALEXin (KEFLEX) 500 MG capsule Take 1 capsule (500 mg total) by mouth 4 (four) times daily. 08/06/14   Julianne Rice, MD  citalopram (CELEXA) 20 MG tablet Take 20 mg by mouth daily.    Historical Provider, MD  lamoTRIgine (LAMICTAL) 25 MG tablet Take 50 mg by mouth daily.    Historical Provider, MD   BP 152/91 mmHg  Pulse 104  Temp(Src) 98.7 F (37.1 C) (Oral)  Resp 18  SpO2 99%  LMP 09/13/2014 Physical Exam  Constitutional: She is oriented to  person, place, and time. She appears well-developed and well-nourished. No distress.  HENT:  Head: Normocephalic and atraumatic.  Mouth/Throat: Oropharynx is clear and moist. No oropharyngeal exudate.  Eyes: Conjunctivae are normal. Pupils are equal, round, and reactive to light.  Neck: Normal range of motion. Neck supple. No tracheal deviation present. No thyromegaly present.  Cardiovascular: Normal rate, regular rhythm, normal heart sounds and intact distal pulses.  Exam reveals no gallop and no friction rub.   No murmur heard. Pulmonary/Chest: Effort normal and breath sounds normal. No stridor. No respiratory distress. She has no wheezes. She has no rales. She exhibits no tenderness.  Abdominal: Soft. Bowel sounds are normal. There is no hepatosplenomegaly. There is tenderness. There is no rigidity, no rebound, no guarding, no CVA tenderness, no tenderness at McBurney's point and negative Murphy's sign.  Diffuse tenderness throughout entire abdomen, but slightly worse in RUQ and RLQ.  Musculoskeletal: She exhibits no edema or tenderness.  Lymphadenopathy:    She has no cervical adenopathy.  Neurological: She is alert and oriented to person, place, and time.  Skin: Skin is warm and dry. No rash noted. She is not diaphoretic. No erythema. No pallor.  Nursing note and vitals reviewed.   ED Course  Procedures (including critical care time) Labs Review Labs Reviewed  URINE CULTURE  COMPREHENSIVE METABOLIC PANEL  LIPASE, BLOOD  CBC WITH DIFFERENTIAL  URINALYSIS, ROUTINE W REFLEX MICROSCOPIC   Return here as needed.  She is feeling better at this time.  Told to increase her fluid intake, rest as much possible.  Also advised to call her primary care doctor, to inform them she was here    Brent General, PA-C 09/14/14 0045  Johnna Acosta, MD 09/14/14 2352

## 2014-09-13 NOTE — ED Notes (Signed)
When entering the room, pt resting quietly, watching television.

## 2014-09-13 NOTE — ED Notes (Signed)
Per EMS, patient has been nausea for 6 months, constantly. Patient agrees that she has been nausea for 6 months but today has been worse. Patient states she has took her regular prescribed medication.

## 2014-09-14 LAB — URINALYSIS, ROUTINE W REFLEX MICROSCOPIC
Glucose, UA: NEGATIVE mg/dL
Ketones, ur: NEGATIVE mg/dL
Nitrite: POSITIVE — AB
Protein, ur: NEGATIVE mg/dL
Specific Gravity, Urine: 1.044 — ABNORMAL HIGH (ref 1.005–1.030)
Urobilinogen, UA: 4 mg/dL — ABNORMAL HIGH (ref 0.0–1.0)
pH: 6 (ref 5.0–8.0)

## 2014-09-14 LAB — URINE MICROSCOPIC-ADD ON

## 2014-09-14 LAB — POC OCCULT BLOOD, ED: Fecal Occult Bld: NEGATIVE

## 2014-09-14 MED ORDER — PROMETHAZINE HCL 25 MG PO TABS: 25.0000 mg | ORAL_TABLET | Freq: Three times a day (TID) | ORAL | Status: DC | PRN

## 2014-09-14 MED ORDER — HYDROCODONE-ACETAMINOPHEN 5-325 MG PO TABS: 1.0000 | ORAL_TABLET | Freq: Four times a day (QID) | ORAL | Status: DC | PRN

## 2014-09-14 NOTE — Discharge Instructions (Signed)
Return here as needed. Follow up with your doctor. °

## 2014-09-17 LAB — URINE CULTURE: Colony Count: >=100000

## 2014-09-18 NOTE — Progress Notes (Signed)
ED Antimicrobial Stewardship Positive Culture Follow Up   Jordan [name] is an 48 y.o. female who presented to [facility] on 09/13/2014 with a chief complaint of  Chief Complaint  Patient presents with  . Nausea  . Abdominal Pain    Recent Results (from the past 720 hour(s))  Urine culture     Status: None   Collection Time: 09/13/14 11:08 PM  Result Value Ref Range Status   Specimen Description URINE, CLEAN CATCH  Final   Special Requests NONE  Final   Culture  Setup Time   Final    09/14/2014 04:36 Performed at [place]   Final    >=100,000 COLONIES/ML Performed at [facility]    Culture   Final    ESCHERICHIA COLI Performed at [facility]    Report Status 09/17/2014 FINAL  Final   Organism ID, Bacteria ESCHERICHIA COLI  Final      Susceptibility   Escherichia coli - MIC*    AMPICILLIN >=32 RESISTANT Resistant     CEFAZOLIN <=4 SENSITIVE Sensitive     CEFTRIAXONE <=1 SENSITIVE Sensitive     CIPROFLOXACIN <=0.25 SENSITIVE Sensitive     GENTAMICIN <=1 SENSITIVE Sensitive     LEVOFLOXACIN <=0.12 SENSITIVE Sensitive     NITROFURANTOIN <=16 SENSITIVE Sensitive     TOBRAMYCIN <=1 SENSITIVE Sensitive     TRIMETH/SULFA <=20 SENSITIVE Sensitive     PIP/TAZO <=4 SENSITIVE Sensitive     * ESCHERICHIA COLI     [x]  Patient discharged originally without antimicrobial agent and treatment is now indicated  48 yo who presented with N/V issue that has been ongoing for a while. Her UA showed nitrite and some WBC. She has a hx UTI. Culture came back with e.coli  New antibiotic prescription:   Keflex 500mg  PO TID x 7days  ED Provider: [place] Sous[place], [place]   Onnie Boer, PharmD Pager: [phone] Infectious Diseases Pharmacist Phone# [phone]

## 2014-09-19 ENCOUNTER — Telehealth (HOSPITAL_COMMUNITY): Payer: Self-pay

## 2014-09-19 NOTE — ED Notes (Signed)
Post ED Visit - Positive Culture Follow-up: Successful Patient Follow-Up  Culture assessed and recommendations reviewed by: []  Wes [place], Pharm.D., BCPS []  Heide Guile, Pharm.D., BCPS []  Alycia Rossetti, Pharm.D., BCPS []  [place], [place].D., BCPS, AAHIVP []  Legrand Como, Pharm.D., BCPS, AAHIVP []  Hassie Bruce, Pharm.D. []  Milus Glazier, Pharm.D.  Positive urine culture  [x]  Patient discharged without antimicrobial prescription and treatment is now indicated []  Organism is resistant to prescribed ED discharge antimicrobial []  Patient with positive blood cultures  Changes discussed with ED provider: courtney Forcucci PA New antibiotic prescription Keflex 500mg  po TID x 7 days Called to unk  Contacted patient, date 11/30  time 8:19a   Ileene Musa 09/19/2014, 8:18 AM

## 2014-09-20 ENCOUNTER — Emergency Department (HOSPITAL_COMMUNITY)
Admission: EM | Admit: 2014-09-20 | Discharge: 2014-09-21 | Disposition: A | Discharge status: Home / Self Care | Payer: Medicaid Other | Attending: Emergency Medicine | Admitting: Emergency Medicine

## 2014-09-20 ENCOUNTER — Encounter (HOSPITAL_COMMUNITY): Payer: Self-pay | Admitting: Nurse Practitioner

## 2014-09-20 DIAGNOSIS — N390 Urinary tract infection, site not specified: Secondary | ICD-10-CM | POA: Diagnosis not present

## 2014-09-20 DIAGNOSIS — Z8619 Personal history of other infectious and parasitic diseases: Secondary | ICD-10-CM | POA: Insufficient documentation

## 2014-09-20 DIAGNOSIS — I959 Hypotension, unspecified: Secondary | ICD-10-CM | POA: Insufficient documentation

## 2014-09-20 DIAGNOSIS — F319 Bipolar disorder, unspecified: Secondary | ICD-10-CM | POA: Diagnosis not present

## 2014-09-20 DIAGNOSIS — Z79899 Other long term (current) drug therapy: Secondary | ICD-10-CM | POA: Diagnosis not present

## 2014-09-20 DIAGNOSIS — R55 Syncope and collapse: Secondary | ICD-10-CM | POA: Diagnosis not present

## 2014-09-20 DIAGNOSIS — R42 Dizziness and giddiness: Secondary | ICD-10-CM | POA: Diagnosis present

## 2014-09-20 DIAGNOSIS — E669 Obesity, unspecified: Secondary | ICD-10-CM | POA: Insufficient documentation

## 2014-09-20 LAB — I-STAT TROPONIN, ED: Troponin i, poc: 0 ng/mL (ref 0.00–0.08)

## 2014-09-20 LAB — COMPREHENSIVE METABOLIC PANEL
ALT: 36 U/L — ABNORMAL HIGH (ref 0–35)
AST: 62 U/L — ABNORMAL HIGH (ref 0–37)
Albumin: 3.5 g/dL (ref 3.5–5.2)
Alkaline Phosphatase: 84 U/L (ref 39–117)
Anion gap: 14 (ref 5–15)
BUN: 13 mg/dL (ref 6–23)
CO2: 22 mEq/L (ref 19–32)
Calcium: 9.3 mg/dL (ref 8.4–10.5)
Chloride: 105 mEq/L (ref 96–112)
Creatinine, Ser: 0.68 mg/dL (ref 0.50–1.10)
GFR calc Af Amer: >90 mL/min (ref >90)
GFR calc non Af Amer: >90 mL/min (ref >90)
Glucose, Bld: 109 mg/dL — ABNORMAL HIGH (ref 70–99)
Potassium: 4.2 mEq/L (ref 3.7–5.3)
Sodium: 141 mEq/L (ref 137–147)
Total Bilirubin: 1.8 mg/dL — ABNORMAL HIGH (ref 0.3–1.2)
Total Protein: 6.3 g/dL (ref 6.0–8.3)

## 2014-09-20 LAB — CBC WITH DIFFERENTIAL/PLATELET
Basophils Absolute: 0 10*3/uL (ref 0.0–0.1)
Basophils Relative: 0 % (ref 0–1)
Eosinophils Absolute: 0 10*3/uL (ref 0.0–0.7)
Eosinophils Relative: 0 % (ref 0–5)
HCT: 24.7 % — ABNORMAL LOW (ref 36.0–46.0)
Hemoglobin: 8.3 g/dL — ABNORMAL LOW (ref 12.0–15.0)
Lymphocytes Relative: 28 % (ref 12–46)
Lymphs Abs: 0.8 10*3/uL (ref 0.7–4.0)
MCH: 42.1 pg — ABNORMAL HIGH (ref 26.0–34.0)
MCHC: 33.6 g/dL (ref 30.0–36.0)
MCV: 125.4 fL — ABNORMAL HIGH (ref 78.0–100.0)
Monocytes Absolute: 0.1 10*3/uL (ref 0.1–1.0)
Monocytes Relative: 5 % (ref 3–12)
Neutro Abs: 1.9 10*3/uL (ref 1.7–7.7)
Neutrophils Relative %: 67 % (ref 43–77)
Platelets: 161 10*3/uL (ref 150–400)
RBC: 1.97 MIL/uL — ABNORMAL LOW (ref 3.87–5.11)
RDW: 22.2 % — ABNORMAL HIGH (ref 11.5–15.5)
WBC: 2.8 10*3/uL — ABNORMAL LOW (ref 4.0–10.5)

## 2014-09-20 LAB — URINE MICROSCOPIC-ADD ON

## 2014-09-20 LAB — URINALYSIS, ROUTINE W REFLEX MICROSCOPIC
Glucose, UA: NEGATIVE mg/dL
Ketones, ur: NEGATIVE mg/dL
Nitrite: NEGATIVE
Protein, ur: NEGATIVE mg/dL
Specific Gravity, Urine: 1.021 (ref 1.005–1.030)
Urobilinogen, UA: 2 mg/dL — ABNORMAL HIGH (ref 0.0–1.0)
pH: 5.5 (ref 5.0–8.0)

## 2014-09-20 MED ORDER — CEPHALEXIN 500 MG PO CAPS: 500.0000 mg | ORAL_CAPSULE | Freq: Four times a day (QID) | ORAL | Status: DC

## 2014-09-20 MED ORDER — CEPHALEXIN 500 MG PO CAPS
500.0000 mg | ORAL_CAPSULE | Freq: Once | ORAL | Status: AC
Start: 2014-09-20 — End: 2014-09-20
  Administered 2014-09-20: 500 mg via ORAL
  Filled 2014-09-20: qty 1

## 2014-09-20 MED ORDER — SODIUM CHLORIDE 0.9 % IV BOLUS (SEPSIS)
1000.0000 mL | INTRAVENOUS | Status: AC
Start: 2014-09-20 — End: 2014-09-21
  Administered 2014-09-20: 1000 mL via INTRAVENOUS

## 2014-09-20 NOTE — ED Notes (Signed)
Pt presents with c/o of lightheadedness, dizziness and fatigue, sen today by her PCP, told she has low Hbg, remarks on possibility of transfusion. C/o of abdominal pain 10/10 secondary to abdominal cyst. Pt recently seen for the same, mildly agitated, states she is "bipolar."

## 2014-09-20 NOTE — ED Provider Notes (Signed)
CSN: [id number]     Arrival date & time 09/20/14  2010 History   First MD Initiated Contact with Patient 09/20/14 2039     No chief complaint on file.  (Consider location/radiation/quality/duration/timing/severity/associated sxs/prior Treatment) HPI  Jordan [name] is a 48 yo female presenting with report of recurrent light-headedness and near-syncopal episodes.  She states tonight they were eating dinner at a restaurant and when she stood up she felt lightheaded and passed out.  She started to return quickly to her baseline after lying on the floor.  She reports for 7 months she has been dealing with this light-headedness that is worse when stands up.  She was seen by her primary care provider today, who wants to set her up for a blood transfuison. Her SO reports sometimes he notices black stools but she is unaware of her stool color.  She reports some nausea today and 2 episodes of diarrhea.  She reports shortness of breath at times also.  She denies chest pain,  fevers, chills or vomiting.    Past Medical History  Diagnosis Date  . Mental disorder   . Bipolar 1 disorder   . Bipolar 1 disorder   . Yeast infection of the vagina   . Bipolar disorder   . Obesity   . UTI (lower urinary tract infection)    Past Surgical History  Procedure Laterality Date  . No past surgeries    . Cholecystectomy     History reviewed. No pertinent family history. History  Substance Use Topics  . Smoking status: Never Smoker   . Smokeless tobacco: Never Used  . Alcohol Use: No   OB History    Gravida Para Term Preterm AB TAB SAB Ectopic Multiple Living   1 1 1       1      Review of Systems  Constitutional: Negative for fever and chills.  HENT: Negative for sore throat.   Eyes: Negative for visual disturbance.  Respiratory: Negative for cough and shortness of breath.   Cardiovascular: Negative for chest pain and leg swelling.  Gastrointestinal: Positive for nausea and abdominal pain. Negative for  vomiting and diarrhea.  Genitourinary: Negative for dysuria.  Musculoskeletal: Negative for myalgias.  Skin: Negative for rash.  Neurological: Positive for syncope and light-headedness. Negative for weakness, numbness and headaches.    Allergies  Tramadol and Sulfa antibiotics  Home Medications   Prior to Admission medications   Medication Sig Start Date End Date Taking? Authorizing Provider  citalopram (CELEXA) 20 MG tablet Take 20 mg by mouth daily.   Yes Historical Provider, MD  gemfibrozil (LOPID) 600 MG tablet Take 600 mg by mouth 2 (two) times daily before a meal.   Yes Historical Provider, MD  HYDROcodone-acetaminophen (NORCO/VICODIN) 5-325 MG per tablet Take 1 tablet by mouth every 6 (six) hours as needed for moderate pain. 09/14/14  Yes Resa Miner Lawyer, PA-C  ibuprofen (ADVIL,MOTRIN) 800 MG tablet Take 800 mg by mouth every 8 (eight) hours as needed for moderate pain.   Yes Historical Provider, MD  Iron-FA-B Cmp-C-Biot-Probiotic (FUSION PLUS PO) Take 1 capsule by mouth daily.   Yes Historical Provider, MD  lamoTRIgine (LAMICTAL) 100 MG tablet Take 100 mg by mouth daily.   Yes Historical Provider, MD  lamoTRIgine (LAMICTAL) 25 MG tablet Take 50 mg by mouth daily.   Yes Historical Provider, MD  omeprazole (PRILOSEC) 20 MG capsule Take 20 mg by mouth daily.   Yes Historical Provider, MD  promethazine (PHENERGAN) 25 MG tablet  Take 1 tablet (25 mg total) by mouth every 8 (eight) hours as needed for nausea or vomiting. 09/14/14  Yes Resa Miner Lawyer, PA-C  cephALEXin (KEFLEX) 500 MG capsule Take 1 capsule (500 mg total) by mouth 4 (four) times daily. Patient not taking: Reported on 09/20/2014 08/06/14   Julianne Rice, MD   BP 120/61 mmHg  Pulse 98  Temp(Src) 97.7 F (36.5 C) (Oral)  Resp 24  Ht 5\' 5"  (1.651 m)  Wt 354 lb (160.573 kg)  BMI 58.91 kg/m2  SpO2 95%  LMP 09/13/2014 Physical Exam  Constitutional: She is oriented to person, place, and time. She appears  well-developed and well-nourished. No distress.  HENT:  Head: Normocephalic and atraumatic.  Mouth/Throat: Oropharynx is clear and moist. No oropharyngeal exudate.  Eyes: Conjunctivae are normal.  Neck: Neck supple. No thyromegaly present.  Cardiovascular: Normal rate, regular rhythm and intact distal pulses.  Exam reveals no gallop and no friction rub.   No murmur heard. Pulmonary/Chest: Effort normal and breath sounds normal. No respiratory distress. She has no wheezes. She has no rales. She exhibits no tenderness.  Abdominal: Soft. She exhibits no distension and no mass. There is tenderness in the suprapubic area. There is no rigidity, no rebound, no guarding, no CVA tenderness, no tenderness at McBurney's point and negative Murphy's sign.    Musculoskeletal: She exhibits no tenderness.  Lymphadenopathy:    She has no cervical adenopathy.  Neurological: She is alert and oriented to person, place, and time. She has normal strength. No cranial nerve deficit or sensory deficit. Coordination normal. GCS eye subscore is 4. GCS verbal subscore is 5. GCS motor subscore is 6.  Cranial nerves 2-12 intact  Skin: Skin is warm and dry. No rash noted. She is not diaphoretic.  Psychiatric: She has a normal mood and affect.  Nursing note and vitals reviewed.   ED Course  Procedures (including critical care time) Labs Review Labs Reviewed  CBC WITH DIFFERENTIAL - Abnormal; Notable for the following:    WBC 2.8 (*)    RBC 1.97 (*)    Hemoglobin 8.3 (*)    HCT 24.7 (*)    MCV 125.4 (*)    MCH 42.1 (*)    RDW 22.2 (*)    All other components within normal limits  COMPREHENSIVE METABOLIC PANEL - Abnormal; Notable for the following:    Glucose, Bld 109 (*)    AST 62 (*)    ALT 36 (*)    Total Bilirubin 1.8 (*)    All other components within normal limits  URINALYSIS, ROUTINE W REFLEX MICROSCOPIC - Abnormal; Notable for the following:    Color, Urine AMBER (*)    APPearance TURBID (*)    Hgb  urine dipstick TRACE (*)    Bilirubin Urine MODERATE (*)    Urobilinogen, UA 2.0 (*)    Leukocytes, UA LARGE (*)    All other components within normal limits  URINE MICROSCOPIC-ADD ON - Abnormal; Notable for the following:    Squamous Epithelial / LPF FEW (*)    Bacteria, UA MANY (*)    All other components within normal limits  URINE CULTURE  I-STAT TROPOININ, ED    Imaging Review No results found.   EKG Interpretation None      MDM   Final diagnoses:  Near syncope  UTI (lower urinary tract infection)   48 yo with with recurrent report of syncopal episodes with position change. Pt is being evaluated by her PCP for the  same. Pt had episode to night at dinner.  Pt is mildly hypotensive in ED, which improved after NS bolus. CBC, CMP, Lipase, improved from last analysis.  UA with large leukocytes too numerous WBC  Pt has been diagnosed with a UTI. Pt is afebrile, no CVA tenderness, normotensive, and denies N/V. Pt to be dc home with antibiotics and instructions to follow up with PCP if symptoms persist.   Filed Vitals:   09/20/14 2300 09/20/14 2315 09/20/14 2330 09/20/14 2345  BP: 105/63  112/71   Pulse: 80 77 80 78  Temp:      TempSrc:      Resp: 24 17 20 19   Height:      Weight:      SpO2: 97% 97% 97% 100%   Meds given in ED:  Medications  sodium chloride 0.9 % bolus 1,000 mL (0 mLs Intravenous Stopped 09/21/14 0003)  cephALEXin (KEFLEX) capsule 500 mg (500 mg Oral Given 09/20/14 2346)    Discharge Medication List as of 09/20/2014 11:50 PM    START taking these medications   Details  !! cephALEXin (KEFLEX) 500 MG capsule Take 1 capsule (500 mg total) by mouth 4 (four) times daily., Starting 09/20/2014, Until Discontinued, Print     !! - Potential duplicate medications found. Please discuss with provider.         Britt Bottom, NP 09/21/14 [place], MD 09/21/14 1406

## 2014-09-20 NOTE — ED Notes (Signed)
Bed: WA08 Expected date:  Expected time:  Means of arrival:  Comments: EMS, 86 F, Near syncope/Low Hemoglobin

## 2014-09-20 NOTE — Discharge Instructions (Signed)
Please follow the directions provided.  Be sure to follow-up with Dr. Alphonzo Grieve regarding your symptoms.  Take your antibiotic until it is all gone to treat your urinary tract infection.  Be sure to change your position slowly and drink plenty of fluid.  Don't hesitate to return for new, worsening or concerning symptoms.    SEEK IMMEDIATE MEDICAL CARE IF:  You have severe back pain or lower abdominal pain.  You develop chills.  You have nausea or vomiting.  You have continued burning or discomfort with urination.

## 2014-09-21 ENCOUNTER — Other Ambulatory Visit (HOSPITAL_COMMUNITY): Payer: Self-pay | Admitting: *Deleted

## 2014-09-22 ENCOUNTER — Ambulatory Visit (HOSPITAL_COMMUNITY)
Admission: RE | Admit: 2014-09-22 | Discharge: 2014-09-22 | Disposition: A | Discharge status: Home / Self Care | Payer: Medicaid Other | Source: Ambulatory Visit | Attending: Specialist | Admitting: Specialist

## 2014-09-22 DIAGNOSIS — D649 Anemia, unspecified: Secondary | ICD-10-CM | POA: Insufficient documentation

## 2014-09-22 LAB — PREPARE RBC (CROSSMATCH)

## 2014-09-22 LAB — ABO/RH: ABO/RH(D): A NEG

## 2014-09-22 MED ORDER — SODIUM CHLORIDE 0.9 % IV SOLN: Freq: Once | INTRAVENOUS | Status: DC

## 2014-09-23 ENCOUNTER — Telehealth: Payer: Self-pay | Admitting: *Deleted

## 2014-09-23 LAB — TYPE AND SCREEN
ABO/RH(D): A NEG
Antibody Screen: NEGATIVE
Unit division: 0
Unit division: 0

## 2014-09-23 LAB — URINE CULTURE
Colony Count: >=100000
Special Requests: NORMAL

## 2014-09-25 ENCOUNTER — Telehealth (HOSPITAL_COMMUNITY): Payer: Self-pay

## 2014-09-25 NOTE — Telephone Encounter (Signed)
Post ED Visit - Positive Culture Follow-up  Culture report reviewed by antimicrobial stewardship pharmacist: []  Wes Phil Campbell, Pharm.D., BCPS [x]  Heide Guile, Pharm.D., BCPS []  Alycia Rossetti, Pharm.D., BCPS []  Turner, [place].D., BCPS, AAHIVP []  Legrand Como, Pharm.D., BCPS, AAHIVP []  Elicia Lamp, Pharm.D.  Positive Urine culture, >/= 100,000 colonies -> E Coli Treated with Cephalexin, organism sensitive to the same and no further patient follow-up is required at this time.  Dortha Kern 09/25/2014, 7:59 PM

## 2014-10-22 ENCOUNTER — Encounter (HOSPITAL_COMMUNITY): Payer: Self-pay | Admitting: Emergency Medicine

## 2014-10-22 ENCOUNTER — Emergency Department (HOSPITAL_COMMUNITY)
Admission: EM | Admit: 2014-10-22 | Discharge: 2014-10-23 | Disposition: A | Discharge status: Home / Self Care | Payer: Medicaid Other | Attending: Emergency Medicine | Admitting: Emergency Medicine

## 2014-10-22 DIAGNOSIS — Z8619 Personal history of other infectious and parasitic diseases: Secondary | ICD-10-CM | POA: Insufficient documentation

## 2014-10-22 DIAGNOSIS — Z79899 Other long term (current) drug therapy: Secondary | ICD-10-CM | POA: Diagnosis not present

## 2014-10-22 DIAGNOSIS — Z792 Long term (current) use of antibiotics: Secondary | ICD-10-CM | POA: Diagnosis not present

## 2014-10-22 DIAGNOSIS — F319 Bipolar disorder, unspecified: Secondary | ICD-10-CM | POA: Diagnosis not present

## 2014-10-22 DIAGNOSIS — Z23 Encounter for immunization: Secondary | ICD-10-CM | POA: Diagnosis not present

## 2014-10-22 DIAGNOSIS — M65052 Abscess of tendon sheath, left thigh: Secondary | ICD-10-CM | POA: Diagnosis not present

## 2014-10-22 DIAGNOSIS — Z87440 Personal history of urinary (tract) infections: Secondary | ICD-10-CM | POA: Insufficient documentation

## 2014-10-22 DIAGNOSIS — L02416 Cutaneous abscess of left lower limb: Secondary | ICD-10-CM

## 2014-10-22 MED ORDER — TETANUS-DIPHTH-ACELL PERTUSSIS 5-2.5-18.5 LF-MCG/0.5 IM SUSP
0.5000 mL | Freq: Once | INTRAMUSCULAR | Status: AC
  Administered 2014-10-23: 0.5 mL via INTRAMUSCULAR
  Filled 2014-10-22: qty 0.5

## 2014-10-22 MED ORDER — LIDOCAINE-EPINEPHRINE 2 %-1:100000 IJ SOLN
10.0000 mL | Freq: Once | INTRAMUSCULAR | Status: AC
Start: 2014-10-23 — End: 2014-10-23
  Administered 2014-10-23: 10 mL via INTRADERMAL
  Filled 2014-10-22: qty 1

## 2014-10-22 NOTE — ED Provider Notes (Signed)
CSN: [id number]     Arrival date & time 10/22/14  2214 History   First MD Initiated Contact with Patient 10/22/14 2338     Chief Complaint  Patient presents with  . Abscess     (Consider location/radiation/quality/duration/timing/severity/associated sxs/prior Treatment) HPI   49 year old obese female with history of bipolar, recurrent abscesses who presents to the ER via EMS from home complaining of an abscess to the thigh. Patient states she noticed a nodule to her left medial thigh which is painful for the past 2 days. States that her boyfriend tried to pop it but it has gotten worse. She complained of sharp throbbing pain, 8 out of 10, nonradiating. She has had abscess in the past. She is not up-to-date with her tetanus. She denies any fever, numbness, or rash. She request for treatment.   Past Medical History  Diagnosis Date  . Mental disorder   . Bipolar 1 disorder   . Bipolar 1 disorder   . Yeast infection of the vagina   . Bipolar disorder   . Obesity   . UTI (lower urinary tract infection)    Past Surgical History  Procedure Laterality Date  . No past surgeries    . Cholecystectomy     No family history on file. History  Substance Use Topics  . Smoking status: Never Smoker   . Smokeless tobacco: Never Used  . Alcohol Use: No   OB History    Gravida Para Term Preterm AB TAB SAB Ectopic Multiple Living   1 1 1       1      Review of Systems  Constitutional: Negative for fever.  Skin: Positive for rash.      Allergies  Tramadol and Sulfa antibiotics  Home Medications   Prior to Admission medications   Medication Sig Start Date End Date Taking? Authorizing Provider  cephALEXin (KEFLEX) 500 MG capsule Take 1 capsule (500 mg total) by mouth 4 (four) times daily. Patient not taking: Reported on 09/20/2014 08/06/14   Julianne Rice, MD  cephALEXin (KEFLEX) 500 MG capsule Take 1 capsule (500 mg total) by mouth 4 (four) times daily. 09/20/14   Britt Bottom,  NP  citalopram (CELEXA) 20 MG tablet Take 20 mg by mouth daily.    Historical Provider, MD  gemfibrozil (LOPID) 600 MG tablet Take 600 mg by mouth 2 (two) times daily before a meal.    Historical Provider, MD  HYDROcodone-acetaminophen (NORCO/VICODIN) 5-325 MG per tablet Take 1 tablet by mouth every 6 (six) hours as needed for moderate pain. 09/14/14   Resa Miner Lawyer, PA-C  ibuprofen (ADVIL,MOTRIN) 800 MG tablet Take 800 mg by mouth every 8 (eight) hours as needed for moderate pain.    Historical Provider, MD  Iron-FA-B Cmp-C-Biot-Probiotic (FUSION PLUS PO) Take 1 capsule by mouth daily.    Historical Provider, MD  lamoTRIgine (LAMICTAL) 100 MG tablet Take 100 mg by mouth daily.    Historical Provider, MD  lamoTRIgine (LAMICTAL) 25 MG tablet Take 50 mg by mouth daily.    Historical Provider, MD  omeprazole (PRILOSEC) 20 MG capsule Take 20 mg by mouth daily.    Historical Provider, MD  promethazine (PHENERGAN) 25 MG tablet Take 1 tablet (25 mg total) by mouth every 8 (eight) hours as needed for nausea or vomiting. 09/14/14   Resa Miner Lawyer, PA-C   BP 129/81 mmHg  Pulse 91  Temp(Src) 98.4 F (36.9 C) (Oral)  Resp 18  Wt 320 lb (145.151 kg)  SpO2 97%  Physical Exam  Constitutional: She appears well-developed and well-nourished. No distress.  Morbidly obese Caucasian female appears to be in no acute distress.  HENT:  Head: Atraumatic.  Eyes: Conjunctivae are normal.  Neck: Neck supple.  Neurological: She is alert.  Skin: No rash noted.  Left medial thigh with area of induration and fluctuance the size of a quarter, tender to palpation with no surrounding erythema.  Psychiatric: She has a normal mood and affect.  Nursing note and vitals reviewed.   ED Course  Procedures (including critical care time)  11:59 PM Patient with a cutaneous abscess to her left medial thigh which may benefit from I&D. She is not up-to-date with her tetanus, tetanus given.  INCISION AND  DRAINAGE Performed by: Domenic Moras Consent: Verbal consent obtained. Risks and benefits: risks, benefits and alternatives were discussed Type: abscess  Body area: L medial thigh  Anesthesia: local infiltration  Incision was made with a scalpel.  Local anesthetic: lidocaine 2% w epinephrine  Anesthetic total: 2 ml  Complexity: complex Blunt dissection to break up loculations  Drainage: purulent  Drainage amount: minimal  Packing material: none  Patient tolerance: Patient tolerated the procedure well with no immediate complications.     Labs Review Labs Reviewed - No data to display  Imaging Review No results found.   EKG Interpretation None      MDM   Final diagnoses:  Abscess of left thigh    BP 129/81 mmHg  Pulse 91  Temp(Src) 98.4 F (36.9 C) (Oral)  Resp 18  Wt 320 lb (145.151 kg)  SpO2 97%     Domenic Moras, PA-C 10/23/14 0023  Kalman Drape, MD 10/23/14 [phone]

## 2014-10-22 NOTE — ED Notes (Signed)
Per EMS: Pt from home c/o abscess to inner thigh.  States that "her boyfriend tried to pop it but she made him stop because she was worried it would get infected"

## 2014-10-23 MED ORDER — HYDROCODONE-ACETAMINOPHEN 5-325 MG PO TABS: 1.0000 | ORAL_TABLET | Freq: Four times a day (QID) | ORAL | Status: DC | PRN

## 2014-10-23 NOTE — Discharge Instructions (Signed)

## 2015-01-04 ENCOUNTER — Inpatient Hospital Stay (HOSPITAL_COMMUNITY): Payer: Medicaid Other

## 2015-01-04 ENCOUNTER — Inpatient Hospital Stay (HOSPITAL_COMMUNITY)
Admission: EM | Admit: 2015-01-04 | Discharge: 2015-01-07 | DRG: 809 | Disposition: A | Discharge status: Home / Self Care | Payer: Medicaid Other | Attending: Internal Medicine | Admitting: Internal Medicine

## 2015-01-04 ENCOUNTER — Encounter (HOSPITAL_COMMUNITY): Payer: Self-pay | Admitting: Emergency Medicine

## 2015-01-04 DIAGNOSIS — R1084 Generalized abdominal pain: Secondary | ICD-10-CM

## 2015-01-04 DIAGNOSIS — R109 Unspecified abdominal pain: Secondary | ICD-10-CM | POA: Insufficient documentation

## 2015-01-04 DIAGNOSIS — R7989 Other specified abnormal findings of blood chemistry: Secondary | ICD-10-CM | POA: Diagnosis not present

## 2015-01-04 DIAGNOSIS — R42 Dizziness and giddiness: Secondary | ICD-10-CM | POA: Diagnosis present

## 2015-01-04 DIAGNOSIS — N390 Urinary tract infection, site not specified: Secondary | ICD-10-CM | POA: Diagnosis present

## 2015-01-04 DIAGNOSIS — W19XXXA Unspecified fall, initial encounter: Secondary | ICD-10-CM | POA: Diagnosis present

## 2015-01-04 DIAGNOSIS — Z9049 Acquired absence of other specified parts of digestive tract: Secondary | ICD-10-CM | POA: Diagnosis present

## 2015-01-04 DIAGNOSIS — R0602 Shortness of breath: Secondary | ICD-10-CM

## 2015-01-04 DIAGNOSIS — Z6841 Body Mass Index (BMI) 40.0 and over, adult: Secondary | ICD-10-CM

## 2015-01-04 DIAGNOSIS — R748 Abnormal levels of other serum enzymes: Secondary | ICD-10-CM

## 2015-01-04 DIAGNOSIS — K760 Fatty (change of) liver, not elsewhere classified: Secondary | ICD-10-CM | POA: Diagnosis present

## 2015-01-04 DIAGNOSIS — D61818 Other pancytopenia: Principal | ICD-10-CM | POA: Diagnosis present

## 2015-01-04 DIAGNOSIS — Z888 Allergy status to other drugs, medicaments and biological substances status: Secondary | ICD-10-CM

## 2015-01-04 DIAGNOSIS — Z882 Allergy status to sulfonamides status: Secondary | ICD-10-CM | POA: Diagnosis not present

## 2015-01-04 DIAGNOSIS — F319 Bipolar disorder, unspecified: Secondary | ICD-10-CM | POA: Diagnosis present

## 2015-01-04 DIAGNOSIS — I959 Hypotension, unspecified: Secondary | ICD-10-CM | POA: Diagnosis present

## 2015-01-04 DIAGNOSIS — D7589 Other specified diseases of blood and blood-forming organs: Secondary | ICD-10-CM | POA: Diagnosis present

## 2015-01-04 HISTORY — DX: Alcohol abuse, uncomplicated: F10.10

## 2015-01-04 LAB — COMPREHENSIVE METABOLIC PANEL
ALT: 69 U/L — ABNORMAL HIGH (ref 0–35)
AST: 85 U/L — ABNORMAL HIGH (ref 0–37)
Albumin: 4 g/dL (ref 3.5–5.2)
Alkaline Phosphatase: 89 U/L (ref 39–117)
Anion gap: 9 (ref 5–15)
BUN: 10 mg/dL (ref 6–23)
CO2: 24 mmol/L (ref 19–32)
Calcium: 9.2 mg/dL (ref 8.4–10.5)
Chloride: 105 mmol/L (ref 96–112)
Creatinine, Ser: 0.76 mg/dL (ref 0.50–1.10)
GFR calc Af Amer: >90 mL/min (ref >90)
GFR calc non Af Amer: >90 mL/min (ref >90)
Glucose, Bld: 95 mg/dL (ref 70–99)
Potassium: 3.6 mmol/L (ref 3.5–5.1)
Sodium: 138 mmol/L (ref 135–145)
Total Bilirubin: 2.3 mg/dL — ABNORMAL HIGH (ref 0.3–1.2)
Total Protein: 6.4 g/dL (ref 6.0–8.3)

## 2015-01-04 LAB — URINALYSIS, ROUTINE W REFLEX MICROSCOPIC
Glucose, UA: NEGATIVE mg/dL
Ketones, ur: 15 mg/dL — AB
Nitrite: POSITIVE — AB
Protein, ur: 30 mg/dL — AB
Specific Gravity, Urine: 1.03 (ref 1.005–1.030)
Urobilinogen, UA: >8 mg/dL — ABNORMAL HIGH (ref 0.0–1.0)
pH: 6 (ref 5.0–8.0)

## 2015-01-04 LAB — URINE MICROSCOPIC-ADD ON

## 2015-01-04 LAB — CBC WITH DIFFERENTIAL/PLATELET
Basophils Absolute: 0 10*3/uL (ref 0.0–0.1)
Basophils Relative: 0 % (ref 0–1)
Eosinophils Absolute: 0 10*3/uL (ref 0.0–0.7)
Eosinophils Relative: 0 % (ref 0–5)
HCT: 29.9 % — ABNORMAL LOW (ref 36.0–46.0)
Hemoglobin: 10.6 g/dL — ABNORMAL LOW (ref 12.0–15.0)
Lymphocytes Relative: 47 % — ABNORMAL HIGH (ref 12–46)
Lymphs Abs: 1 10*3/uL (ref 0.7–4.0)
MCH: 39.3 pg — ABNORMAL HIGH (ref 26.0–34.0)
MCHC: 35.5 g/dL (ref 30.0–36.0)
MCV: 110.7 fL — ABNORMAL HIGH (ref 78.0–100.0)
Monocytes Absolute: 0.1 10*3/uL (ref 0.1–1.0)
Monocytes Relative: 3 % (ref 3–12)
Neutro Abs: 1.1 10*3/uL — ABNORMAL LOW (ref 1.7–7.7)
Neutrophils Relative %: 50 % (ref 43–77)
Platelets: 111 10*3/uL — ABNORMAL LOW (ref 150–400)
RBC: 2.7 MIL/uL — ABNORMAL LOW (ref 3.87–5.11)
RDW: 14.8 % (ref 11.5–15.5)
WBC: 2.2 10*3/uL — ABNORMAL LOW (ref 4.0–10.5)

## 2015-01-04 LAB — PROTIME-INR
INR: 1.01 (ref 0.00–1.49)
Prothrombin Time: 13.4 seconds (ref 11.6–15.2)

## 2015-01-04 LAB — POC OCCULT BLOOD, ED: Fecal Occult Bld: NEGATIVE

## 2015-01-04 LAB — TYPE AND SCREEN
ABO/RH(D): A NEG
Antibody Screen: NEGATIVE

## 2015-01-04 LAB — I-STAT CG4 LACTIC ACID, ED: Lactic Acid, Venous: 2.9 mmol/L (ref 0.5–2.0)

## 2015-01-04 MED ORDER — DEXTROSE 5 % IV SOLN
1.0000 g | Freq: Once | INTRAVENOUS | Status: AC
  Administered 2015-01-04: 1 g via INTRAVENOUS
  Filled 2015-01-04: qty 10

## 2015-01-04 MED ORDER — SODIUM CHLORIDE 0.9 % IV BOLUS (SEPSIS)
1000.0000 mL | Freq: Once | INTRAVENOUS | Status: AC
  Administered 2015-01-04: 1000 mL via INTRAVENOUS

## 2015-01-04 MED ORDER — PANTOPRAZOLE SODIUM 40 MG IV SOLR
40.0000 mg | Freq: Once | INTRAVENOUS | Status: AC
  Administered 2015-01-04: 40 mg via INTRAVENOUS
  Filled 2015-01-04: qty 40

## 2015-01-04 MED ORDER — DEXTROSE 5 % IV SOLN
1.0000 g | INTRAVENOUS | Status: DC
  Administered 2015-01-05 – 2015-01-06 (×2): 1 g via INTRAVENOUS
  Filled 2015-01-04 (×6): qty 10

## 2015-01-04 NOTE — H&P (Signed)
Date: 01/04/2015               Patient Name:  Jordan [name] MRN: [id number]  DOB: October 03, 1966 Age / Sex: 49 y.o., female   PCP: Pcp Not In System         Medical Service: Internal Medicine Teaching Service         Attending Physician: Dr. Madilyn Fireman, MD    First Contact: Dr. Luan Moore  Pager: [phone]  Second Contact: Dr. Joni Reining Pager: [phone]       After Hours (After 5p/  First Contact Pager: [phone]  weekends / holidays): Second Contact Pager: [phone]   Chief Complaint: Dizziness  History of Present Illness: Jordan [name] is a 49 year old female with morbid obesity, bipolar disorder who presents with dizziness.  For the last 6 months, she reports worsening dizziness that's precipitated by activity, like getting up from when she is sitting or getting into the bathtub. During this interval, she also reports associated dark stools, lower abdominal pain bilaterally, shortness of breath, and pallor but no nausea, vomiting, diarrhea. She reports she went to go see her regular doctor today, Dr. Lillia Corporal, at which point she reports she collapsed on the floor though recalls entire incident. When the ambulance arrived to take her to the hospital, she complained that she could not get up. She reports having similar symptoms before after which she received 1 unit of blood and made her feel much better; she also had a prior endoscopy and colonoscopy though cannot remember they were done nor any abnormal findings. She also recalls being told by Dr. Alphonzo Grieve that she had an ovarian cyst that was found after he applied a cream on her  abdomen and was wondering if she could be checked for that as she thinks her symptoms may be related to that. She is unaware of her family history as both of her parents are dead and she no longer speaks with her siblings. She denies any history of tobacco use and most recent use of alcohol being 2 beers about several weeks ago. She is currently on  disability given her bipolar disorder and lives with her boyfriend of 9 years at home though they are not sexually active as she has lost her sex drive with menopause but no prior history of STI's. In the ED, she was found to have lactate 2.9 and and given 2 L normal saline bolus. She was also given Protonix 40 mg IV for prior history of GI bleed though FOBT was negative.   Meds: No current facility-administered medications for this encounter.   Current Outpatient Prescriptions  Medication Sig Dispense Refill  . citalopram (CELEXA) 20 MG tablet Take 20 mg by mouth daily.    Marland Kitchen estrogens, conjugated, (PREMARIN) 0.625 MG tablet Take 0.625 mg by mouth daily. Take daily for 21 days then do not take for 7 days.    Marland Kitchen gemfibrozil (LOPID) 600 MG tablet Take 600 mg by mouth 2 (two) times daily before a meal.    . ibuprofen (ADVIL,MOTRIN) 800 MG tablet Take 800 mg by mouth every 8 (eight) hours as needed for moderate pain.    . Iron-FA-B Cmp-C-Biot-Probiotic (FUSION PLUS PO) Take 1 capsule by mouth daily.    Marland Kitchen lamoTRIgine (LAMICTAL) 100 MG tablet Take 100 mg by mouth daily. Pt takes a total of 150 mg    . lamoTRIgine (LAMICTAL) 25 MG tablet Take 50 mg by mouth daily. Pt takes a total of 150 mg    .  omeprazole (PRILOSEC) 20 MG capsule Take 20 mg by mouth daily.    . cephALEXin (KEFLEX) 500 MG capsule Take 1 capsule (500 mg total) by mouth 4 (four) times daily. (Patient not taking: Reported on 09/20/2014) 40 capsule 0  . HYDROcodone-acetaminophen (NORCO/VICODIN) 5-325 MG per tablet Take 1 tablet by mouth every 6 (six) hours as needed for moderate pain. 8 tablet 0    Allergies: Allergies as of 01/04/2015 - Review Complete 01/04/2015  Allergen Reaction Noted  . Tramadol Anaphylaxis, Shortness Of Breath, and Swelling 08/31/2011  . Sulfa antibiotics Swelling 08/31/2011   Past Medical History  Diagnosis Date  . Mental disorder   . Bipolar 1 disorder   . Bipolar 1 disorder   . Yeast infection of the  vagina   . Bipolar disorder   . Obesity   . UTI (lower urinary tract infection)    Past Surgical History  Procedure Laterality Date  . No past surgeries    . Cholecystectomy     No family history on file. History   Social History  . Marital Status: Single    Spouse Name: N/A  . Number of Children: N/A  . Years of Education: N/A   Occupational History  . Not on file.   Social History Main Topics  . Smoking status: Never Smoker   . Smokeless tobacco: Never Used  . Alcohol Use: No  . Drug Use: No  . Sexual Activity: Yes    Birth Control/ Protection: None   Other Topics Concern  . Not on file   Social History Narrative    Review of Systems: Review of Systems  Constitutional: Positive for malaise/fatigue. Negative for fever and chills.  HENT: Negative for sore throat.   Respiratory: Positive for shortness of breath. Negative for cough.   Cardiovascular: Negative for leg swelling.  Gastrointestinal: Positive for abdominal pain and melena. Negative for nausea, vomiting, diarrhea and blood in stool.  Genitourinary: Positive for frequency. Negative for dysuria, hematuria and flank pain.       Foul-smelling urine  Neurological: Positive for tingling (legs bilaterally).     Physical Exam: Blood pressure 111/52, pulse 89, temperature 98.9 F (37.2 C), temperature source Oral, resp. rate 17, height 5\' 8"  (1.727 m), weight 350 lb (158.759 kg), SpO2 96 %. General: Obese Caucasian female, NAD HEENT: PERRL, EOMI, no scleral icterus, oropharynx clear, poor dentition Cardiac: RRR, no rubs, murmurs or gallops the auscultation limited by large body habitus Pulm: clear to auscultation bilaterally though poor airflow bilaterally likely from poor effort Abd: soft, nontender though patient reports pain with mild palpation but no facial grimace, nondistended, BS present Ext: warm and well perfused, no pedal or tibial edema Neuro: CN II-XII intact, 5/5 upper and lower extremity  strength, 2+ grip strength, finger to nose & rapid alternating movements intact   Lab results: Basic Metabolic Panel:  Recent Labs  01/04/15 1609  NA 138  K 3.6  CL 105  CO2 24  GLUCOSE 95  BUN 10  CREATININE 0.76  CALCIUM 9.2   Liver Function Tests:  Recent Labs  01/04/15 1609  AST 85*  ALT 69*  ALKPHOS 89  BILITOT 2.3*  PROT 6.4  ALBUMIN 4.0   CBC:  Recent Labs  01/04/15 1609  WBC 2.2*  NEUTROABS 1.1*  HGB 10.6*  HCT 29.9*  MCV 110.7*  PLT 111*   Coagulation:  Recent Labs  01/04/15 1609  LABPROT 13.4  INR 1.01    Urinalysis:  Recent Labs  01/04/15  [place] 1.030  PHURINE 6.0  GLUCOSEU NEGATIVE  HGBUR SMALL*  BILIRUBINUR MODERATE*  KETONESUR 15*  PROTEINUR 30*  UROBILINOGEN >8.0*  NITRITE POSITIVE*  LEUKOCYTESUR LARGE*    Imaging results:  Dg Chest 2 View  01/04/2015   CLINICAL DATA:  Chest pain and dyspnea  EXAM: CHEST  2 VIEW  COMPARISON:  08/18/2014  FINDINGS: The heart size and mediastinal contours are within normal limits. Both lungs are clear. The visualized skeletal structures are unremarkable.  IMPRESSION: No active cardiopulmonary disease.   Electronically Signed   By: Andreas Newport M.D.   On: 01/04/2015 22:37     Assessment & Plan by Problem:  Dizziness: Given her prior history of GI bleeding and blood transfusion, another bleed is certainly suspect however she does have pancytopenia which raises suspicion for a nutritional deficiency or bone marrow process. Hemoglobin 10.6 on admission, baseline 10-12, though has been macrocytic since September with MCV greater than 100. WBCs 2.2 with absolute neutrophil count 1.1 and has been seen on prior lab work though appears to be intermittent. Platelets 111, is also low. Lamictal is not known to typically cause suppression of multiple cell lines. Iron tablets were listed on her home medication list though she reports not being on any supplementation. Also  unclear why she has had a prior upper endoscopy and colonoscopy. She does not have any known family history from which we can consider the possibility of a bone marrow process or chronic GI disease that might point towards malabsorption. No HIV test on file either though she does not report high risk sexual behavior. Also unknown history of illicit drug use and questionable history of substance use which could certainly cause nutritional deficiency. Other things to consider also include CVA, though she does not have any focal neurologic deficits on exam, or thyroid disorder though last TSH 06/29/2012, 2.012, was reassuring. Chest x-ray without acute infiltrate. She was hemodynamically stable at the time of interview. -Check lactate -Check head CT -Check UDS and serum ethanol -Check HIV & hepatitis panel -Check anemia panel, haptoglobin, LDH  -Follow blood cultures x 2 collected in the ED -Check TSH -Check orthostatics followed by 1L NS bolus with thiamine, folate, and multivitamins -Contact her PCP tomorrow for further information  Abnormal LFTs: Total bilirubin 2.3 with AST 85 and ALT 69. Possibly contributory to her anemia though unclear. Abdominal CT 09/13/2014 notable for calcified granuloma identified within the liver and prior cholecystectomy but no splenomegaly. -Check fractionated bilirubin  UTI: Consistent with the report of symptoms and UA findings of pyuria. -Give ceftriaxone 1 g IV 3 days -Check urine culture  Bipolar disorder: Per chart review, she was hospitalized in January 2011. Her medications include Celexa 20 mg and Lamictal 150 mg though patient cannot confirm dose documented for either medication. -Continue home medications  Morbid obesity: Check A1c  #FEN:  -Diet: Heart healthy  #DVT prophylaxis: Heparin  #CODE STATUS: FULL CODE -Defer to boyfriend Roseanne Reno [phone] if patients lacks decision-making capacity -Confirmed with patient on  admission    Dispo: Disposition is deferred at this time, awaiting improvement of current medical problems.   The patient does have a current PCP (No Pcp Per Patient) and does not need an [facility] hospital follow-up appointment after discharge.  The patient does not know have transportation limitations that hinder transportation to clinic appointments.  Signed: Riccardo Dubin, MD 01/04/2015, 9:13 PM

## 2015-01-04 NOTE — ED Notes (Signed)
Attempted iv x 2

## 2015-01-04 NOTE — ED Notes (Signed)
Phlebotomy called again regarding labs.

## 2015-01-04 NOTE — ED Provider Notes (Signed)
CSN: [id number]     Arrival date & time 01/04/15  1549 History   First MD Initiated Contact with Patient 01/04/15 1549     Chief Complaint  Patient presents with  . Weakness     (Consider location/radiation/quality/duration/timing/severity/associated sxs/prior Treatment) Patient is a 49 y.o. female presenting with hematochezia. The history is provided by the patient.  Rectal Bleeding Quality:  Black and tarry Amount:  Scant Duration:  3 months Timing:  Constant Progression:  Unchanged Chronicity:  Recurrent Similar prior episodes: yes   Relieved by:  None tried Worsened by:  Nothing tried Ineffective treatments:  None tried Associated symptoms: abdominal pain and light-headedness   Associated symptoms: no hematemesis, no loss of consciousness, no recent illness and no vomiting   Abdominal pain:    Location:  Epigastric   Quality:  Aching   Severity:  Mild   Onset quality:  Gradual Risk factors: no anticoagulant use and no NSAID use     Past Medical History  Diagnosis Date  . Mental disorder   . Bipolar 1 disorder   . Bipolar 1 disorder   . Yeast infection of the vagina   . Bipolar disorder   . Obesity   . UTI (lower urinary tract infection)    Past Surgical History  Procedure Laterality Date  . No past surgeries    . Cholecystectomy     No family history on file. History  Substance Use Topics  . Smoking status: Never Smoker   . Smokeless tobacco: Never Used  . Alcohol Use: No   OB History    Gravida Para Term Preterm AB TAB SAB Ectopic Multiple Living   1 1 1       1      Review of Systems  Respiratory: Negative for cough, chest tightness and shortness of breath.   Cardiovascular: Negative for chest pain.  Gastrointestinal: Positive for abdominal pain, blood in stool and hematochezia. Negative for nausea, vomiting, diarrhea and hematemesis.  Genitourinary: Negative for dysuria and difficulty urinating.  Neurological: Positive for light-headedness.  Negative for loss of consciousness.  All other systems reviewed and are negative.     Allergies  Tramadol and Sulfa antibiotics  Home Medications   Prior to Admission medications   Medication Sig Start Date End Date Taking? Authorizing Provider  citalopram (CELEXA) 20 MG tablet Take 20 mg by mouth daily.   Yes Historical Provider, MD  estrogens, conjugated, (PREMARIN) 0.625 MG tablet Take 0.625 mg by mouth daily. Take daily for 21 days then do not take for 7 days.   Yes Historical Provider, MD  gemfibrozil (LOPID) 600 MG tablet Take 600 mg by mouth 2 (two) times daily before a meal.   Yes Historical Provider, MD  ibuprofen (ADVIL,MOTRIN) 800 MG tablet Take 800 mg by mouth every 8 (eight) hours as needed for moderate pain.   Yes Historical Provider, MD  Iron-FA-B Cmp-C-Biot-Probiotic (FUSION PLUS PO) Take 1 capsule by mouth daily.   Yes Historical Provider, MD  lamoTRIgine (LAMICTAL) 100 MG tablet Take 100 mg by mouth daily. Pt takes a total of 150 mg   Yes Historical Provider, MD  lamoTRIgine (LAMICTAL) 25 MG tablet Take 50 mg by mouth daily. Pt takes a total of 150 mg   Yes Historical Provider, MD  omeprazole (PRILOSEC) 20 MG capsule Take 20 mg by mouth daily.   Yes Historical Provider, MD  cephALEXin (KEFLEX) 500 MG capsule Take 1 capsule (500 mg total) by mouth 4 (four) times daily. Patient not  taking: Reported on 09/20/2014 08/06/14   Julianne Rice, MD  HYDROcodone-acetaminophen (NORCO/VICODIN) 5-325 MG per tablet Take 1 tablet by mouth every 6 (six) hours as needed for moderate pain. 10/23/14   Domenic Moras, PA-C   BP 95/77 mmHg  Pulse 88  Temp(Src) 98.9 F (37.2 C) (Oral)  Resp 18  Ht 5\' 8"  (1.727 m)  Wt 350 lb (158.759 kg)  BMI 53.23 kg/m2  SpO2 96%  LMP 09/13/2014 Physical Exam  Constitutional: She appears well-developed and well-nourished. No distress.  HENT:  Head: Normocephalic and atraumatic.  Mouth/Throat: No oropharyngeal exudate.  No sublingual pallor  Eyes:  Pupils are equal, round, and reactive to light. No scleral icterus.  Conjunctiva pink  Neck: Normal range of motion. Neck supple.  Cardiovascular: Normal rate, regular rhythm, normal heart sounds and intact distal pulses.  Exam reveals no gallop and no friction rub.   No murmur heard. Pulmonary/Chest: Effort normal and breath sounds normal. No respiratory distress. She has no wheezes. She has no rales.  Abdominal: Soft. Bowel sounds are normal. She exhibits no distension. There is tenderness (mild, epigastric).  obese  Musculoskeletal: Normal range of motion. She exhibits no edema or tenderness.  Lymphadenopathy:    She has no cervical adenopathy.  Skin: Skin is warm and dry. She is not diaphoretic. No pallor.    ED Course  Procedures (including critical care time) Labs Review Labs Reviewed  CBC WITH DIFFERENTIAL/PLATELET - Abnormal; Notable for the following:    WBC 2.2 (*)    RBC 2.70 (*)    Hemoglobin 10.6 (*)    HCT 29.9 (*)    MCV 110.7 (*)    MCH 39.3 (*)    Platelets 111 (*)    Lymphocytes Relative 47 (*)    Neutro Abs 1.1 (*)    All other components within normal limits  COMPREHENSIVE METABOLIC PANEL - Abnormal; Notable for the following:    AST 85 (*)    ALT 69 (*)    Total Bilirubin 2.3 (*)    All other components within normal limits  URINALYSIS, ROUTINE W REFLEX MICROSCOPIC - Abnormal; Notable for the following:    Color, Urine ORANGE (*)    APPearance CLOUDY (*)    Hgb urine dipstick SMALL (*)    Bilirubin Urine MODERATE (*)    Ketones, ur 15 (*)    Protein, ur 30 (*)    Urobilinogen, UA >8.0 (*)    Nitrite POSITIVE (*)    Leukocytes, UA LARGE (*)    All other components within normal limits  URINE MICROSCOPIC-ADD ON - Abnormal; Notable for the following:    Squamous Epithelial / LPF FEW (*)    Bacteria, UA MANY (*)    All other components within normal limits  I-STAT CG4 LACTIC ACID, ED - Abnormal; Notable for the following:    Lactic Acid, Venous 2.90  (*)    All other components within normal limits  CULTURE, BLOOD (ROUTINE X 2)  CULTURE, BLOOD (ROUTINE X 2)  URINE CULTURE  PROTIME-INR  LACTIC ACID, PLASMA  LACTIC ACID, PLASMA  URINE RAPID DRUG SCREEN (HOSP PERFORMED)  ETHANOL  POC OCCULT BLOOD, ED  I-STAT CG4 LACTIC ACID, ED  TYPE AND SCREEN    Imaging Review Dg Chest 2 View  01/04/2015   CLINICAL DATA:  Chest pain and dyspnea  EXAM: CHEST  2 VIEW  COMPARISON:  08/18/2014  FINDINGS: The heart size and mediastinal contours are within normal limits. Both lungs are clear. The visualized  skeletal structures are unremarkable.  IMPRESSION: No active cardiopulmonary disease.   Electronically Signed   By: Andreas Newport M.D.   On: 01/04/2015 22:37     EKG Interpretation None      MDM   Final diagnoses:  UTI (lower urinary tract infection)   49 year old female presents with symptoms concerning for slow upper GI bleed. Inflammatory stools for 3 months as well as increasing shortness of breath, lightheadedness, weakness. She was seen at her PCP today where systolic blood pressure was noted in the 80s and she was transferred here for further assessment.  On arrival blood pressure soft at 540 systolic with a heart rate of 110. She does have delayed cap refill in her extremities and concern for possible slow upper GI bleed. Protonix given as well as fluid bolus. Labs including coags obtained and pointed here Hemoccult sent. No melena noted on my rectal exam.  9:00 PM Hemoccult negative. Blood count stable. Lactic acid slightly high at 2.9. Additionally, urinalysis suggestive of urinary tract infection which the patient suspected. Tube of Rocephin. She'll be admitted to medicine. Blood pressure significantly improved with third liter of fluid.  Larence Penning, MD 01/04/15 [place], MD 01/06/15 [phone]

## 2015-01-04 NOTE — ED Notes (Signed)
Per ems- pt from doc. Office for evaluation of possible GI bleed. Pt having increased weakness over past 3 months. Pt has had blood transfusion before for this but never followed up with GI. Vs stable.

## 2015-01-04 NOTE — ED Notes (Addendum)
CT tech sts pt was unable to lay down for CT scan. Pt sts the bed was too small.

## 2015-01-04 NOTE — ED Notes (Signed)
Phlebotomy called regarding labs.  

## 2015-01-04 NOTE — ED Notes (Signed)
Pt to CT at this time.

## 2015-01-05 ENCOUNTER — Inpatient Hospital Stay (HOSPITAL_COMMUNITY): Payer: Medicaid Other

## 2015-01-05 ENCOUNTER — Encounter (HOSPITAL_COMMUNITY): Payer: Self-pay | Admitting: Radiology

## 2015-01-05 DIAGNOSIS — B9620 Unspecified Escherichia coli [E. coli] as the cause of diseases classified elsewhere: Secondary | ICD-10-CM | POA: Insufficient documentation

## 2015-01-05 DIAGNOSIS — R42 Dizziness and giddiness: Secondary | ICD-10-CM | POA: Insufficient documentation

## 2015-01-05 DIAGNOSIS — D61818 Other pancytopenia: Principal | ICD-10-CM

## 2015-01-05 DIAGNOSIS — N390 Urinary tract infection, site not specified: Secondary | ICD-10-CM

## 2015-01-05 LAB — COMPREHENSIVE METABOLIC PANEL
ALT: 56 U/L — ABNORMAL HIGH (ref 0–35)
AST: 70 U/L — ABNORMAL HIGH (ref 0–37)
Albumin: 3.4 g/dL — ABNORMAL LOW (ref 3.5–5.2)
Alkaline Phosphatase: 71 U/L (ref 39–117)
Anion gap: 7 (ref 5–15)
BUN: 9 mg/dL (ref 6–23)
CO2: 22 mmol/L (ref 19–32)
Calcium: 8.4 mg/dL (ref 8.4–10.5)
Chloride: 110 mmol/L (ref 96–112)
Creatinine, Ser: 0.59 mg/dL (ref 0.50–1.10)
GFR calc Af Amer: >90 mL/min (ref >90)
GFR calc non Af Amer: >90 mL/min (ref >90)
Glucose, Bld: 93 mg/dL (ref 70–99)
Potassium: 3.7 mmol/L (ref 3.5–5.1)
Sodium: 139 mmol/L (ref 135–145)
Total Bilirubin: 1.9 mg/dL — ABNORMAL HIGH (ref 0.3–1.2)
Total Protein: 5.5 g/dL — ABNORMAL LOW (ref 6.0–8.3)

## 2015-01-05 LAB — RAPID URINE DRUG SCREEN, HOSP PERFORMED
Amphetamines: NOT DETECTED
Barbiturates: NOT DETECTED
Benzodiazepines: NOT DETECTED
Cocaine: NOT DETECTED
Opiates: NOT DETECTED
Tetrahydrocannabinol: NOT DETECTED

## 2015-01-05 LAB — CBC
HCT: 24.7 % — ABNORMAL LOW (ref 36.0–46.0)
Hemoglobin: 8.7 g/dL — ABNORMAL LOW (ref 12.0–15.0)
MCH: 39 pg — ABNORMAL HIGH (ref 26.0–34.0)
MCHC: 35.2 g/dL (ref 30.0–36.0)
MCV: 110.8 fL — ABNORMAL HIGH (ref 78.0–100.0)
Platelets: 106 10*3/uL — ABNORMAL LOW (ref 150–400)
RBC: 2.23 MIL/uL — ABNORMAL LOW (ref 3.87–5.11)
RDW: 15.1 % (ref 11.5–15.5)
WBC: 1.9 10*3/uL — ABNORMAL LOW (ref 4.0–10.5)

## 2015-01-05 LAB — FOLATE: Folate: >20 ng/mL

## 2015-01-05 LAB — TSH: TSH: 2.462 u[IU]/mL (ref 0.350–4.500)

## 2015-01-05 LAB — VITAMIN B12: Vitamin B-12: 347 pg/mL (ref 211–911)

## 2015-01-05 LAB — FERRITIN: Ferritin: 618 ng/mL — ABNORMAL HIGH (ref 10–291)

## 2015-01-05 LAB — IRON AND TIBC
Iron: 120 ug/dL (ref 42–145)
Saturation Ratios: 63 % — ABNORMAL HIGH (ref 20–55)
TIBC: 192 ug/dL — ABNORMAL LOW (ref 250–470)
UIBC: 72 ug/dL — ABNORMAL LOW (ref 125–400)

## 2015-01-05 LAB — TECHNOLOGIST SMEAR REVIEW: Tech Review: DECREASED

## 2015-01-05 LAB — HEPATITIS PANEL, ACUTE
HCV Ab: NEGATIVE
Hep A IgM: NONREACTIVE
Hep B C IgM: NONREACTIVE
Hepatitis B Surface Ag: NEGATIVE

## 2015-01-05 LAB — PROTIME-INR
INR: 1.1 (ref 0.00–1.49)
Prothrombin Time: 14.3 seconds (ref 11.6–15.2)

## 2015-01-05 LAB — RETICULOCYTES
RBC.: 2.23 MIL/uL — ABNORMAL LOW (ref 3.87–5.11)
Retic Count, Absolute: 22.3 10*3/uL (ref 19.0–186.0)
Retic Ct Pct: 1 % (ref 0.4–3.1)

## 2015-01-05 LAB — BILIRUBIN, FRACTIONATED(TOT/DIR/INDIR)
Bilirubin, Direct: 0.5 mg/dL (ref 0.0–0.5)
Indirect Bilirubin: 1.3 mg/dL — ABNORMAL HIGH (ref 0.3–0.9)
Total Bilirubin: 1.8 mg/dL — ABNORMAL HIGH (ref 0.3–1.2)

## 2015-01-05 LAB — SAVE SMEAR

## 2015-01-05 LAB — LACTATE DEHYDROGENASE: LDH: 712 U/L — ABNORMAL HIGH (ref 94–250)

## 2015-01-05 LAB — I-STAT CG4 LACTIC ACID, ED: Lactic Acid, Venous: 1.87 mmol/L (ref 0.5–2.0)

## 2015-01-05 LAB — LACTIC ACID, PLASMA: Lactic Acid, Venous: 1.4 mmol/L (ref 0.5–2.0)

## 2015-01-05 LAB — MRSA PCR SCREENING: MRSA by PCR: NEGATIVE

## 2015-01-05 MED ORDER — CITALOPRAM HYDROBROMIDE 20 MG PO TABS
20.0000 mg | ORAL_TABLET | Freq: Every day | ORAL | Status: DC
  Administered 2015-01-05 – 2015-01-07 (×3): 20 mg via ORAL
  Filled 2015-01-05: qty 2
  Filled 2015-01-05 (×2): qty 1

## 2015-01-05 MED ORDER — PANTOPRAZOLE SODIUM 40 MG PO TBEC
40.0000 mg | DELAYED_RELEASE_TABLET | Freq: Every day | ORAL | Status: DC
  Administered 2015-01-05 – 2015-01-07 (×3): 40 mg via ORAL
  Filled 2015-01-05 (×3): qty 1

## 2015-01-05 MED ORDER — THIAMINE HCL 100 MG/ML IJ SOLN
Freq: Once | INTRAVENOUS | Status: AC
  Administered 2015-01-05: 04:00:00 via INTRAVENOUS
  Filled 2015-01-05: qty 1000

## 2015-01-05 MED ORDER — LAMOTRIGINE 100 MG PO TABS
150.0000 mg | ORAL_TABLET | Freq: Every day | ORAL | Status: DC
  Administered 2015-01-06 – 2015-01-07 (×2): 150 mg via ORAL
  Filled 2015-01-05 (×2): qty 2

## 2015-01-05 MED ORDER — LAMOTRIGINE 100 MG PO TABS
50.0000 mg | ORAL_TABLET | Freq: Every day | ORAL | Status: DC
  Administered 2015-01-05: 50 mg via ORAL
  Filled 2015-01-05: qty 1

## 2015-01-05 MED ORDER — SODIUM CHLORIDE 0.9 % IJ SOLN
3.0000 mL | Freq: Two times a day (BID) | INTRAMUSCULAR | Status: DC
  Administered 2015-01-06 (×2): 3 mL via INTRAVENOUS
  Filled 2015-01-05: qty 3

## 2015-01-05 MED ORDER — LAMOTRIGINE 100 MG PO TABS
100.0000 mg | ORAL_TABLET | Freq: Every day | ORAL | Status: DC
  Administered 2015-01-05: 100 mg via ORAL
  Filled 2015-01-05: qty 1

## 2015-01-05 MED ORDER — HEPARIN SODIUM (PORCINE) 5000 UNIT/ML IJ SOLN
5000.0000 [IU] | Freq: Three times a day (TID) | INTRAMUSCULAR | Status: DC
  Administered 2015-01-05 – 2015-01-06 (×2): 5000 [IU] via SUBCUTANEOUS
  Filled 2015-01-05 (×3): qty 1

## 2015-01-05 NOTE — Consult Note (Signed)
Referring MD: Dr. Madilyn Fireman; Dr. Luan Moore  PCP:  [facility] In [facility]   hematology consultation  Reason for Referral: Unexplained pancytopenia   Chief Complaint  Patient presents with  . Weakness    HPI:  49 year old woman who is been in overall excellent health without any major medical or surgical problems except for bipolar disorder diagnosed approximately 9 years ago. She has been on Lamictal and Celexa possibly since diagnosis. She was told she was anemic about 4 months ago. Available records reveal a hemoglobin of 7.8 with MCV 119 on 09/13/2014. She did receive a blood transfusion. Lab recorded on 05/16/2014: Hemoglobin 12.4, hematocrit 35, MCV 115, white count 5100, no differential, platelets 182,000. White count first noted to be decreased on 06/25/2014 at 3100. Hemoglobin 10.7,   58 neutrophils, 38 lymphocytes, 4 monocytes, platelets 147,000. On 08/05/2014 platelets first noted to be down at 127,000. Further decrease in white count 2300, and hemoglobin 9.5, with MCV 114. On admission yesterday 01/04/2015 hemoglobin 10.6, hematocrit 29.9, MCV 111, white count 2200, 50% neutrophils, 47 lymphocytes, 3 monocytes, platelet count 111,000.  She has had generalized weakness, lightheadedness, and dyspnea on exertion. She reports no signs of bleeding and denies epistaxis, hematochezia, melena, or hematuria. She has not started any new medications. She has never been exposed to organic chemicals or therapeutic radiation. She denies any prior history of hepatitis, yellow jaundice, or mononucleosis. She has no signs or symptoms of a collagen vascular disorder and denies polyarthralgia, skin rash, or hair loss. Despite what is recorded in the history by another examiner, she denies alcohol or tobacco use. She denies any recreational drug use. She does report increasing lower abdominal pain. A CT scan of the abdomen and pelvis done back in November 2015 showed calcified granulomas in the  liver, changes of prior cholecystectomy, normal-appearing spleen, no lymphadenopathy, uterus and adnexa appeared normal.   Past Medical History  Diagnosis Date  . Mental disorder   . Bipolar 1 disorder   . Bipolar 1 disorder   . Yeast infection of the vagina   . Bipolar disorder   . Obesity   . UTI (lower urinary tract infection)       . Shortness of breath dyspnea   : She denies hypertension, MI, ulcers, asthma, emphysema, tuberculosis, thyroid disease, kidney stones, seizure, stroke. No inflammatory arthritis.   Past Surgical History  Procedure Laterality Date  . Cholecystectomy    :  . cefTRIAXone (ROCEPHIN)  IV  1 g Intravenous Q24H  . citalopram  20 mg Oral Daily  . heparin  5,000 Units Subcutaneous 3 times per day  . [START ON 01/06/2015] lamoTRIgine  150 mg Oral Daily  . pantoprazole  40 mg Oral Daily  . sodium chloride  3 mL Intravenous Q12H  :  Allergies  Allergen Reactions  . Tramadol Anaphylaxis, Shortness Of Breath and Swelling  . Sulfa Antibiotics Swelling  :   family history.: Parents are deceased. She is estranged from her siblings. She knows nothing of their health histories. She has a daughter from her first husband who is 84 years old and living in [place]   History   Social History  . Marital Status: Single. She has a steady boyfriend of 9 years who accompanies her today.     Spouse Name: N/A  . Number of Children:  healthy daughter from her previous marriage aged 29 living in [place].   . Years of Education: N/A   Occupational History  .  she does  not work. She is on permanent disability due to her behavioral health issues.    Social History Main Topics  . Smoking status: Never Smoker   . Smokeless tobacco: Never Used  . Alcohol Use: No  . Drug Use: No  . Sexual Activity: Yes    Birth Control/ Protection: None   Other Topics Concern  . Not on file   Social History Narrative  :  ROS: Eyes: No change in vision. No diplopia Throat: No  sore throat Neck: Resp: No cough. Positive dyspnea on exertion   Cardio: Denies ischemic type chest pain, applications, or chest pressure GI: No dysphagia. Diffuse abdominal pain worse in the lower quadrants chronic for the last 4 months. No hematochezia or melena Extremities:  Lymph nodes: No swollen glands Neurologic:  Denies paresthesias Skin: . Denies any skin rash Genitourinary: Denies hematuria; she is perimenopausal. Last menstrual period was 4 months ago.   Vitals: Filed Vitals:   01/05/15 1538  BP: 147/73  Pulse: 58  Temp: 98.7 F (37.1 C)  Resp:     PHYSICAL EXAM: General appearance: A morbidly obese Caucasian woman HEENT: Pharynx no erythema or exudate. Tongue is well papillated. Lymph Nodes: No cervical, supraclavicular, or axillary adenopathy Resp: Coarse rales both lungs one half the way up. Resonant to percussion throughout Cardio: Regular rhythm no murmur no gallop no rub Vascular: Extremities warm, well-perfused no cyanosis Breasts: GI: Abdomen is soft, obese, diffusely tender lower quadrants greater than upper quadrants. No gross organomegaly or abdominal mass GU: Extremities: No edema. No calf tenderness Neurologic: She is alert and oriented, concentration is good and she is able to spell the word house backwards with ease. PERRLA. Full extraocular movements. Motor strength 5 over 5. Reflexes 1+ symmetric at the biceps absent symmetric at the knees. Skin: No rash or ecchymosis.  Labs:   Recent Labs  01/04/15 1609 01/05/15 1040  WBC 2.2* 1.9*  HGB 10.6* 8.7*  HCT 29.9* 24.7*  PLT 111* 106*    Recent Labs  01/04/15 1609 01/05/15 1306  NA 138 139  K 3.6 3.7  CL 105 110  CO2 24 22  GLUCOSE 95 93  BUN 10 9  CREATININE 0.76 0.59  CALCIUM 9.2 8.4  LDH: 712 Total bilirubin 1.8, indirect 1.3, direct 0.5 Reticulocyte count 1% SGOT 85 SGPT 69 total protein 6.4: Phosphatase 89 albumin 4.0 initial bilirubin 2. 3 repeat 1.8   Blood smear review:   Dimorphic population of red cells with some large macrocytes but not the typical macro ovalocytes seen with B12 deficiency. No polychromasia. No spherocytes. No schistocytes. Rare teardrop red cell. Total white count is decreased. Neutrophils appear mature in granulation and lobation. No multilobed neutrophils. No early myeloid cells. No blasts. Lymphocytes are mature. Rare benign reactive lymphocyte. Platelets decreased approximately 7 per high-power field.  Images Studies/Results:  Dg Chest 2 View  01/04/2015   CLINICAL DATA:  Chest pain and dyspnea  EXAM: CHEST  2 VIEW  COMPARISON:  08/18/2014  FINDINGS: The heart size and mediastinal contours are within normal limits. Both lungs are clear. The visualized skeletal structures are unremarkable.  IMPRESSION: No active cardiopulmonary disease.   Electronically Signed   By: Andreas Newport M.D.   On: 01/04/2015 22:37   Ct Head Wo Contrast  01/05/2015   CLINICAL DATA:  Dizziness and increased weakness for 3 months. History of bipolar disorder, dizziness.  EXAM: CT HEAD WITHOUT CONTRAST  TECHNIQUE: Contiguous axial images were obtained from the base of the skull  through the vertex without intravenous contrast.  COMPARISON:  None.  FINDINGS: The ventricles and sulci are normal. No intraparenchymal hemorrhage, mass effect nor midline shift. No acute large vascular territory infarcts.  No abnormal extra-axial fluid collections. Basal cisterns are patent. Mild calcific atherosclerosis of the carotid siphon.  No skull fracture. The included ocular globes and orbital contents are non-suspicious. Mild paranasal sinus mucosal thickening without air-fluid levels. Trace RIGHT mastoid effusion. Absent maxillary teeth.  IMPRESSION: No acute intracranial process; mild calcific atherosclerosis.   Electronically Signed   By: Elon Alas   On: 01/05/2015 02:58        Assessment: Active Problems:   Bipolar disorder   Hypotension   UTI (urinary tract  infection)  Impression: Pancytopenia of 8 months duration Prominent macrocytosis but no polychromasia, or increased reticulocytes. Mild elevation of indirect bilirubin with disproportionate elevation of serum LDH.  Differential at this time includes: Pernicious anemia: although typical changes in the neutrophils are not seen on current smear  Toxic effects of Lamictal on the bone marrow: Uncommon side effect of this drug but reported pancytopenia in less than 1% of cases  Primary bone marrow dysfunction. This would include but would not be limited to myelodysplastic syndrome or leukemia. 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 Review of the blood film is bland at this time with no immature cells present. This is encouraging but does not completely exclude a underlying bone marrow disease.  A chronic infectious process such as hepatitis or HIV could also present like this. She does have mild transaminase and bilirubin elevations and diffuse abdominal tenderness. Except for granulomas, liver appeared normal on CT abdomen and pelvis done in November 2015.   Recommendation: B20, folic acid levels, hepatitis profile, HIV testing, all ordered and in progress. If they return negative, she will need a bone marrow aspiration and biopsy. In view of her morbid obesity, and request for sedation, this would best be done by our interventional radiologists. Probable need for bone marrow biopsy discussed with the patient and her boyfriend. She agrees to have the procedure done. We may need to stop her Lamictal and substitute with an agent in a different class.  Aarushi Hemric M 01/05/2015, 6:14 PM

## 2015-01-05 NOTE — ED Notes (Signed)
Gave pt sandwich and coca cola, per RN

## 2015-01-05 NOTE — Progress Notes (Signed)
Interval HPI (after speaking to PCP.)   History of Present Illness: Jordan [name] is a 49 year old female with morbid obesity, bipolar disorder who presents with dizziness, abdominal pain and decreased appetite. For the last 6 months, she reports worsening positional dizziness, worsened on standing up. During this interval, she also reports black stools, stabbing 10/10 lower abdominal pain bilaterally, low appetite due to the abdominal pain, shortness of breath, and pallor but no nausea, vomiting, diarrhea. Her PCP was been concerned for a GI bleed and has referred her to GI, but she has not followed up with them.  Approximately 3 months ago she got a blood transfusion and her dizziness and appetite improved, but these improvements did not last and she has had a recurrence of her symptoms for the past 2-3 months. She reports she collapsed on the floor at her home yesterday when she got up to go see her primary care physician, although she recalls the entire incident. I spoke with the PCP's office and they confirmed that Jordan [name] had come in to their office yesterday for a psychiatric medication visit, was very pale and lightheaded in their office with a systolic BP in the 87'F. They called the ambulance to take her to the hospital and during transfer she fell and hit her head and shoulder on the stretcher.  Historically, she had an endoscopy and colonoscopy in her 20's for gas and severe abdominal pain and recalls that she had "something" removed laparoscopically, but she does not recall any specifics. She is unaware of her family history as both of her parents are dead and she no longer speaks with her siblings. She denies any history of tobacco use and most recent use of alcohol being 2 beers about several weeks ago. She is not sexually active as she has lost her sex drive with menopause 3 months ago (PCP believes her menopause was actually over a year ago,) and has no prior history of STI's. She also recalls  being told by Dr. Alphonzo Grieve that she had an ovarian cyst that was found after he applied a cream on her abdomen and was wondering if her symptoms may be related to that.  Meds: Current Facility-Administered Medications  Medication Dose Route Frequency Provider Last Rate Last Dose  . cefTRIAXone (ROCEPHIN) 1 g in dextrose 5 % 50 mL IVPB  1 g Intravenous Q24H Madilyn Fireman, MD      . citalopram (CELEXA) tablet 20 mg  20 mg Oral Daily Otho Bellows, MD      . heparin injection 5,000 Units  5,000 Units Subcutaneous 3 times per day Otho Bellows, MD      . lamoTRIgine (LAMICTAL) tablet 100 mg  100 mg Oral Daily Otho Bellows, MD      . lamoTRIgine (LAMICTAL) tablet 50 mg  50 mg Oral Daily Otho Bellows, MD      . pantoprazole (PROTONIX) EC tablet 40 mg  40 mg Oral Daily Otho Bellows, MD      . sodium chloride 0.9 % injection 3 mL  3 mL Intravenous Q12H Otho Bellows, MD       Current Outpatient Prescriptions  Medication Sig Dispense Refill  . citalopram (CELEXA) 20 MG tablet Take 20 mg by mouth daily.    Marland Kitchen estrogens, conjugated, (PREMARIN) 0.625 MG tablet Take 0.625 mg by mouth daily. Take daily for 21 days then do not take for 7 days.    Marland Kitchen gemfibrozil (LOPID) 600 MG tablet Take 600 mg  by mouth 2 (two) times daily before a meal.    . ibuprofen (ADVIL,MOTRIN) 800 MG tablet Take 800 mg by mouth every 8 (eight) hours as needed for moderate pain.    . Iron-FA-B Cmp-C-Biot-Probiotic (FUSION PLUS PO) Take 1 capsule by mouth daily.    Marland Kitchen lamoTRIgine (LAMICTAL) 100 MG tablet Take 100 mg by mouth daily. Pt takes a total of 150 mg    . lamoTRIgine (LAMICTAL) 25 MG tablet Take 50 mg by mouth daily. Pt takes a total of 150 mg    . omeprazole (PRILOSEC) 20 MG capsule Take 20 mg by mouth daily.    . cephALEXin (KEFLEX) 500 MG capsule Take 1 capsule (500 mg total) by mouth 4 (four) times daily. (Patient not taking: Reported on 09/20/2014) 40 capsule 0  . HYDROcodone-acetaminophen (NORCO/VICODIN)  5-325 MG per tablet Take 1 tablet by mouth every 6 (six) hours as needed for moderate pain. 8 tablet 0    Allergies: Allergies as of 01/04/2015 - Review Complete 01/04/2015  Allergen Reaction Noted  . Tramadol Anaphylaxis, Shortness Of Breath, and Swelling 08/31/2011  . Sulfa antibiotics Swelling 08/31/2011   Past Medical History  Diagnosis Date  . Mental disorder   . Bipolar 1 disorder   . Bipolar 1 disorder   . Yeast infection of the vagina   . Bipolar disorder   . Obesity   . UTI (lower urinary tract infection)    Past Surgical History  Procedure Laterality Date  . No past surgeries    . Cholecystectomy     Family history:  Unclear given that parents are dead and she does not speak with her siblings. History of alcoholism in father, maternal grandfather and brother.   Social history:  She is currently on disability given her bipolar disorder and lives with her boyfriend of 9 years. She used to drink occasionally but has cut back tremendously for the past 8 years given her family history. No tobacco or illicit drug use. Her level of education is unclear though her PCP reports that she is unable to read.   Review of Systems: Constitutional: Positive for malaise/fatigue. Negative for fever and chills.  HENT: Negative for sore throat.  Respiratory: Positive for shortness of breath. Negative for cough.  Cardiovascular: Negative for leg swelling.  Gastrointestinal: Positive for abdominal pain and melena. Negative for nausea, vomiting, diarrhea and blood in stool.  Genitourinary: Positive for frequency and foul smelling urine. Negative for dysuria, hematuria and flank pain.  Neurological: Positive for tingling in both legs.   Physical Exam: Blood pressure 144/106, pulse 115, temperature 98.9 F (37.2 C), temperature source Oral, resp. rate 21, height 5' 8"  (1.727 m), weight 158.759 kg (350 lb), last menstrual period 09/13/2014, SpO2 100 %. General: Obese Caucasian female,  NAD HEENT: PERRL, EOMI, no scleral icterus, oropharynx clear, poor dentition Cardiac: RRR, no rubs, murmurs or gallops the auscultation limited by large body habitus Pulm: clear to auscultation bilaterally though poor airflow bilaterally likely from poor effort Abd: soft, nontender though patient reports pain with mild palpation but no facial grimace, nondistended, BS present Ext: warm and well perfused, no pedal or tibial edema Neuro: CN II-XII intact, 5/5 upper and lower extremity strength, 2+ grip strength, finger to nose & rapid alternating movements intact  Lab results: Recent Results (from the past 2160 hour(s))  Urinalysis, Routine w reflex microscopic     Status: Abnormal   Collection Time: 01/04/15  4:00 PM  Result Value Ref Range   Color, Urine  ORANGE (A) YELLOW    Comment: BIOCHEMICALS MAY BE AFFECTED BY COLOR   APPearance CLOUDY (A) CLEAR   Specific Gravity, Urine 1.030 1.005 - 1.030   pH 6.0 5.0 - 8.0   Glucose, UA NEGATIVE NEGATIVE mg/dL   Hgb urine dipstick SMALL (A) NEGATIVE   Bilirubin Urine MODERATE (A) NEGATIVE   Ketones, ur 15 (A) NEGATIVE mg/dL   Protein, ur 30 (A) NEGATIVE mg/dL   Urobilinogen, UA >8.0 (H) 0.0 - 1.0 mg/dL   Nitrite POSITIVE (A) NEGATIVE   Leukocytes, UA LARGE (A) NEGATIVE  Urine microscopic-add on     Status: Abnormal   Collection Time: 01/04/15  4:00 PM  Result Value Ref Range   Squamous Epithelial / LPF FEW (A) RARE   WBC, UA TOO NUMEROUS TO COUNT <3 WBC/hpf   RBC / HPF 0-2 <3 RBC/hpf   Bacteria, UA MANY (A) RARE   Urine-Other MUCOUS PRESENT   CBC with Differential     Status: Abnormal   Collection Time: 01/04/15  4:09 PM  Result Value Ref Range   WBC 2.2 (L) 4.0 - 10.5 K/uL   RBC 2.70 (L) 3.87 - 5.11 MIL/uL   Hemoglobin 10.6 (L) 12.0 - 15.0 g/dL   HCT 29.9 (L) 36.0 - 46.0 %   MCV 110.7 (H) 78.0 - 100.0 fL   MCH 39.3 (H) 26.0 - 34.0 pg   MCHC 35.5 30.0 - 36.0 g/dL   RDW 14.8 11.5 - 15.5 %   Platelets 111 (L) 150 - 400 K/uL     Comment: REPEATED TO VERIFY SPECIMEN CHECKED FOR CLOTS PLATELET COUNT CONFIRMED BY SMEAR    Neutrophils Relative % 50 43 - 77 %   Lymphocytes Relative 47 (H) 12 - 46 %   Monocytes Relative 3 3 - 12 %   Eosinophils Relative 0 0 - 5 %   Basophils Relative 0 0 - 1 %   Neutro Abs 1.1 (L) 1.7 - 7.7 K/uL   Lymphs Abs 1.0 0.7 - 4.0 K/uL   Monocytes Absolute 0.1 0.1 - 1.0 K/uL   Eosinophils Absolute 0.0 0.0 - 0.7 K/uL   Basophils Absolute 0.0 0.0 - 0.1 K/uL   RBC Morphology ELLIPTOCYTES     Comment: OVAL MACROCYTES  Protime-INR     Status: None   Collection Time: 01/04/15  4:09 PM  Result Value Ref Range   Prothrombin Time 13.4 11.6 - 15.2 seconds   INR 1.01 0.00 - 1.49  Comprehensive metabolic panel     Status: Abnormal   Collection Time: 01/04/15  4:09 PM  Result Value Ref Range   Sodium 138 135 - 145 mmol/L   Potassium 3.6 3.5 - 5.1 mmol/L   Chloride 105 96 - 112 mmol/L   CO2 24 19 - 32 mmol/L   Glucose, Bld 95 70 - 99 mg/dL   BUN 10 6 - 23 mg/dL   Creatinine, Ser 0.76 0.50 - 1.10 mg/dL   Calcium 9.2 8.4 - 10.5 mg/dL   Total Protein 6.4 6.0 - 8.3 g/dL   Albumin 4.0 3.5 - 5.2 g/dL   AST 85 (H) 0 - 37 U/L   ALT 69 (H) 0 - 35 U/L   Alkaline Phosphatase 89 39 - 117 U/L   Total Bilirubin 2.3 (H) 0.3 - 1.2 mg/dL   GFR calc non Af Amer >90 >90 mL/min   GFR calc Af Amer >90 >90 mL/min    Comment: (NOTE) The eGFR has been calculated using the CKD EPI equation. This calculation  has not been validated in all clinical situations. eGFR's persistently <90 mL/min signify possible Chronic Kidney Disease.    Anion gap 9 5 - 15  Type and screen     Status: None   Collection Time: 01/04/15  4:16 PM  Result Value Ref Range   ABO/RH(D) A NEG    Antibody Screen NEG    Sample Expiration 01/07/2015   Urine rapid drug screen (hosp performed)     Status: None   Collection Time: 01/04/15  4:25 PM  Result Value Ref Range   Opiates NONE DETECTED NONE DETECTED   Cocaine NONE DETECTED NONE  DETECTED   Benzodiazepines NONE DETECTED NONE DETECTED   Amphetamines NONE DETECTED NONE DETECTED   Tetrahydrocannabinol NONE DETECTED NONE DETECTED   Barbiturates NONE DETECTED NONE DETECTED    Comment:        DRUG SCREEN FOR MEDICAL PURPOSES ONLY.  IF CONFIRMATION IS NEEDED FOR ANY PURPOSE, NOTIFY LAB WITHIN 5 DAYS.        LOWEST DETECTABLE LIMITS FOR URINE DRUG SCREEN Drug Class       Cutoff (ng/mL) Amphetamine      1000 Barbiturate      200 Benzodiazepine   160 Tricyclics       737 Opiates          300 Cocaine          300 THC              50   POC occult blood, ED     Status: None   Collection Time: 01/04/15  5:35 PM  Result Value Ref Range   Fecal Occult Bld NEGATIVE NEGATIVE  I-Stat CG4 Lactic Acid, ED     Status: Abnormal   Collection Time: 01/04/15  8:59 PM  Result Value Ref Range   Lactic Acid, Venous 2.90 (HH) 0.5 - 2.0 mmol/L   Comment NOTIFIED PHYSICIAN   I-Stat CG4 Lactic Acid, ED     Status: None   Collection Time: 01/04/15 11:11 PM  Result Value Ref Range   Lactic Acid, Venous 1.87 0.5 - 2.0 mmol/L  Lactic acid, plasma     Status: None   Collection Time: 01/04/15 11:48 PM  Result Value Ref Range   Lactic Acid, Venous 1.4 0.5 - 2.0 mmol/L  CBC     Status: Abnormal (Preliminary result)   Collection Time: 01/05/15 10:40 AM  Result Value Ref Range   WBC 1.9 (L) 4.0 - 10.5 K/uL    Comment: REPEATED TO VERIFY   RBC 2.23 (L) 3.87 - 5.11 MIL/uL   Hemoglobin 8.7 (L) 12.0 - 15.0 g/dL    Comment: SPECIMEN CHECKED FOR CLOTS REPEATED TO VERIFY    HCT 24.7 (L) 36.0 - 46.0 %   MCV 110.8 (H) 78.0 - 100.0 fL    Comment: CONSISTENT WITH PREVIOUS RESULT   MCH 39.0 (H) 26.0 - 34.0 pg   MCHC 35.2 30.0 - 36.0 g/dL   RDW 15.1 11.5 - 15.5 %   Platelets PENDING 150 - 400 K/uL  Reticulocytes     Status: Abnormal   Collection Time: 01/05/15 10:40 AM  Result Value Ref Range   Retic Ct Pct 1.0 0.4 - 3.1 %   RBC. 2.23 (L) 3.87 - 5.11 MIL/uL   Retic Count, Manual 22.3  19.0 - 186.0 K/uL     Imaging results:  Dg Chest 2 View  01/04/2015   CLINICAL DATA:  Chest pain and dyspnea  EXAM: CHEST  2 VIEW  COMPARISON:  08/18/2014  FINDINGS: The heart size and mediastinal contours are within normal limits. Both lungs are clear. The visualized skeletal structures are unremarkable.  IMPRESSION: No active cardiopulmonary disease.   Electronically Signed   By: Andreas Newport M.D.   On: 01/04/2015 22:37   Ct Head Wo Contrast  01/05/2015   CLINICAL DATA:  Dizziness and increased weakness for 3 months. History of bipolar disorder, dizziness.  EXAM: CT HEAD WITHOUT CONTRAST  TECHNIQUE: Contiguous axial images were obtained from the base of the skull through the vertex without intravenous contrast.  COMPARISON:  None.  FINDINGS: The ventricles and sulci are normal. No intraparenchymal hemorrhage, mass effect nor midline shift. No acute large vascular territory infarcts.  No abnormal extra-axial fluid collections. Basal cisterns are patent. Mild calcific atherosclerosis of the carotid siphon.  No skull fracture. The included ocular globes and orbital contents are non-suspicious. Mild paranasal sinus mucosal thickening without air-fluid levels. Trace RIGHT mastoid effusion. Absent maxillary teeth.  IMPRESSION: No acute intracranial process; mild calcific atherosclerosis.   Electronically Signed   By: Elon Alas   On: 01/05/2015 02:58   Assessment & Plan by Problem: Active Problems:   Bipolar disorder   Hypotension   UTI (urinary tract infection)  Assessment & Plan by Problem:  Dizziness: Hemoglobin 10.6 on admission with an MCV of 110, WBCs 2.2 with absolute neutrophil count 1.1, platelets 111. Given her prior history of melena and abdominal pain,  GI bleeding is likely in spite of the negative FOB as documented by her PCP. Given the pancytopenia, other possible etiology includes nutritional deficiency (folate, B12, zinc) or bone marrow process (malignancy, infection such  as parvovirus B19.) Other things to consider also include CVA, though she does not have any focal neurologic deficits on exam, or thyroid disorder though last TSH of 2.012 in 2013 is reassuring.  -Check lactate -Check head CT -Check UDS and serum ethanol -Check HIV & hepatitis panel -Check anemia panel, haptoglobin, LDH  -Follow blood cultures x 2 collected in the ED -Check TSH -Check orthostatics followed by 1L NS bolus with thiamine, folate, and multivitamins  Abnormal LFTs: Total bilirubin 2.3 with AST 85 and ALT 69. Possibly contributory to her anemia though unclear. Abdominal CT 09/13/2014 notable for calcified granuloma identified within the liver and prior cholecystectomy but no splenomegaly. -Check fractionated bilirubin  Dysuria: Consistent with UTI given the symptoms of dysuria, foul smelling urine and UA findings of pyuria. -Give ceftriaxone 1 g IV 3 days -Check urine culture  Bipolar disorder: Patient states that her mood is good, with ups and downs that do not affect her ability to live her life. Per chart review, patient was hospitalized in January 2011. Her medications include Celexa 20 mg and Lamictal 150 mg. Her PCP is managing her psychiatric medications and communicated to [name] that patient's Lamictal has consistently been sub therapeutic but she has not allowed them to increase it or discontinue it because she states that it helps her. -Continue home medications  Morbid obesity: Check A1c  #FEN:  -Diet: Heart healthy  #DVT prophylaxis: Heparin  #CODE STATUS: FULL CODE

## 2015-01-05 NOTE — ED Notes (Signed)
Pt transported back to CT 

## 2015-01-05 NOTE — Progress Notes (Signed)
PT Cancellation Note  Patient Details Name: Jordan [name] MRN: [id number] DOB: 11-02-1965   Cancelled Treatment:    Reason Eval/Treat Not Completed: Patient declined, no reason specified.  Gets too dizzy to get up, pt not willing to risk getting up. 01/05/2015  Donnella Sham, [place] [phone]  (pager)   Everest Hacking, Tessie Fass 01/05/2015, 4:52 PM

## 2015-01-05 NOTE — Progress Notes (Signed)
Subjective:  Patient reporting some concerns about an ovarian cyst that has not yet been worked up for her during this hospitalization. She states that she has had symptoms of dizziness and lightheadedness for several months ago that had been alleviated by a prior transfusion. Patient is stating that she is still dizzy and this has not improved since her initial evaluation.  Objective: Vital signs in last 24 hours: Filed Vitals:   01/05/15 1100 01/05/15 1130 01/05/15 1202 01/05/15 1237  BP: 99/74 101/45  117/51  Pulse: 92 89  99  Temp:   98.5 F (36.9 C) 98.4 F (36.9 C)  TempSrc:   Oral Oral  Resp: 22 20    Height:      Weight:      SpO2: 98% 96%  99%   Weight change:   Intake/Output Summary (Last 24 hours) at 01/05/15 1339 Last data filed at 01/05/15 0335  Gross per 24 hour  Intake   3050 ml  Output    100 ml  Net   2950 ml    General: resting in bed, in no acute distress HEENT: PERRL, EOMI, no scleral icterus Cardiac: RRR, no rubs, murmurs or gallops Pulm: clear to auscultation bilaterally, moving normal volumes of air Abd: soft, nontender, nondistended, BS present Ext: warm and well perfused, no pedal edema Neuro: alert and oriented X3, cranial nerves II-XII grossly intact Skin: no rashes or lesions noted Psych: appropriate affect  Lab Results: Basic Metabolic Panel:  Recent Labs Lab 01/04/15 1609  NA 138  K 3.6  CL 105  CO2 24  GLUCOSE 95  BUN 10  CREATININE 0.76  CALCIUM 9.2   Liver Function Tests:  Recent Labs Lab 01/04/15 1609 01/05/15 1040  AST 85*  --   ALT 69*  --   ALKPHOS 89  --   BILITOT 2.3* 1.8*  PROT 6.4  --   ALBUMIN 4.0  --    No results for input(s): LIPASE, AMYLASE in the last 168 hours. No results for input(s): AMMONIA in the last 168 hours. CBC:  Recent Labs Lab 01/04/15 1609 01/05/15 1040  WBC 2.2* 1.9*  NEUTROABS 1.1*  --   HGB 10.6* 8.7*  HCT 29.9* 24.7*  MCV 110.7* 110.8*  PLT 111* 106*   Cardiac  Enzymes: No results for input(s): CKTOTAL, CKMB, CKMBINDEX, TROPONINI in the last 168 hours. BNP: No results for input(s): PROBNP in the last 168 hours. D-Dimer: No results for input(s): DDIMER in the last 168 hours. CBG: No results for input(s): GLUCAP in the last 168 hours. Hemoglobin A1C: No results for input(s): HGBA1C in the last 168 hours. Fasting Lipid Panel: No results for input(s): CHOL, HDL, LDLCALC, TRIG, CHOLHDL, LDLDIRECT in the last 168 hours. Thyroid Function Tests:  Recent Labs Lab 01/05/15 1040  TSH 2.462   Coagulation:  Recent Labs Lab 01/04/15 1609 01/05/15 1040  LABPROT 13.4 14.3  INR 1.01 1.10   Anemia Panel:  Recent Labs Lab 01/05/15 1040  RETICCTPCT 1.0   Urine Drug Screen: Drugs of Abuse     Component Value Date/Time   LABOPIA NONE DETECTED 01/04/2015 1625   COCAINSCRNUR NONE DETECTED 01/04/2015 1625   LABBENZ NONE DETECTED 01/04/2015 1625   AMPHETMU NONE DETECTED 01/04/2015 1625   THCU NONE DETECTED 01/04/2015 1625   LABBARB NONE DETECTED 01/04/2015 1625    Alcohol Level: No results for input(s): ETH in the last 168 hours. Urinalysis:  Recent Labs Lab 01/04/15 1600  COLORURINE ORANGE*  LABSPEC 1.030  PHURINE 6.0  GLUCOSEU NEGATIVE  HGBUR SMALL*  BILIRUBINUR MODERATE*  KETONESUR 15*  PROTEINUR 30*  UROBILINOGEN >8.0*  NITRITE POSITIVE*  LEUKOCYTESUR LARGE*   Micro Results: No results found for this or any previous visit (from the past 240 hour(s)). Studies/Results: Dg Chest 2 View  01/04/2015   CLINICAL DATA:  Chest pain and dyspnea  EXAM: CHEST  2 VIEW  COMPARISON:  08/18/2014  FINDINGS: The heart size and mediastinal contours are within normal limits. Both lungs are clear. The visualized skeletal structures are unremarkable.  IMPRESSION: No active cardiopulmonary disease.   Electronically Signed   By: Andreas Newport M.D.   On: 01/04/2015 22:37   Ct Head Wo Contrast  01/05/2015   CLINICAL DATA:  Dizziness and  increased weakness for 3 months. History of bipolar disorder, dizziness.  EXAM: CT HEAD WITHOUT CONTRAST  TECHNIQUE: Contiguous axial images were obtained from the base of the skull through the vertex without intravenous contrast.  COMPARISON:  None.  FINDINGS: The ventricles and sulci are normal. No intraparenchymal hemorrhage, mass effect nor midline shift. No acute large vascular territory infarcts.  No abnormal extra-axial fluid collections. Basal cisterns are patent. Mild calcific atherosclerosis of the carotid siphon.  No skull fracture. The included ocular globes and orbital contents are non-suspicious. Mild paranasal sinus mucosal thickening without air-fluid levels. Trace RIGHT mastoid effusion. Absent maxillary teeth.  IMPRESSION: No acute intracranial process; mild calcific atherosclerosis.   Electronically Signed   By: Elon Alas   On: 01/05/2015 02:58   Medications: I have reviewed the patient's current medications. Scheduled Meds: . cefTRIAXone (ROCEPHIN)  IV  1 g Intravenous Q24H  . citalopram  20 mg Oral Daily  . heparin  5,000 Units Subcutaneous 3 times per day  . [START ON 01/06/2015] lamoTRIgine  150 mg Oral Daily  . pantoprazole  40 mg Oral Daily  . sodium chloride  3 mL Intravenous Q12H   Continuous Infusions:   PRN Meds:. Assessment/Plan: Active Problems:   Bipolar disorder   Hypotension   UTI (urinary tract infection)  Pancytopenia: Patient presenting with low WBC, hemoglobin, and platelets. They have continued to trend down since admission, now with a hemoglobin of 8.7, white count of 1.9, and platelets of 106. Notably, anemia is macrocytic. Cell lines have been down since around September 2015. Patient has had some associated symptoms some mild symptoms of dizziness. Communication with PCP reveals that she was at some suspicion for GI bleeding. However, her pancytopenia in the setting of macrocytosis seems to make this less likely. Review of peripheral smear showing  pancytopenia with macrocytosis without any concerning morphological features. No evidence of hemolysis on peripheral smear. However, LDH is elevated at 710 with an indirect hyperbilirubinemia. Further workup of a primary bone marrow process is warranted. -No indications for transfusion at this point. -Consult hematology/oncology (Dr. Beryle Beams). Patient is aware of this consultation.  -Scheduled for bone marrow biopsy tomorrow with interventional radiology -HIV, vitamin B 12, folate, ferritin, iron, haptoglobin, hepatitis panel pending -Follow-up blood cultures collected in the emergency department.  Abnormal LFTs: Liver enzymes slightly trended down now with an AST of 70 and ALT of 56. Total bilirubin remains elevated secondary to indirect hyperbilirubinemia. The time course of elevation in these enzymes are roughly correlated to those of patient's noted pancytopenia in the chart. -Hepatitis panel pending -Workup for malignant infiltration as above.  UTI: Patient reporting dysuria with consistent urinalysis findings.  -Continue ceftriaxone 1 g IV date 2 of 3. -Urine culture pending  Bipolar disorder: Per chart review, she was hospitalized in January 2011. Her medications include Celexa 20 mg and Lamictal 150 mg though patient cannot confirm dose documented for either medication. -Continue home medications  Morbid obesity: -Hemoglobin A1c pending  #FEN:  -Diet: Heart healthy  #DVT prophylaxis: Heparin  #CODE STATUS: FULL CODE -Defer to boyfriend Roseanne Reno [phone] if patients lacks decision-making capacity -Confirmed with patient on admission  Dispo: Disposition is deferred at this time, awaiting improvement of current medical problems.   The patient does have a current PCP  and does not need an [facility] hospital follow-up appointment after discharge.  The patient does not know have transportation limitations that hinder transportation to clinic appointments.   LOS: 1 day     Services Needed at time of discharge: Y = Yes, Blank = No PT:   OT:   RN:   Equipment:   Other:    Luan Moore, MD 01/05/2015, 1:39 PM

## 2015-01-05 NOTE — Progress Notes (Signed)
UR Completed Jerremy Maione Graves-Bigelow, RN,BSN [phone]  

## 2015-01-06 ENCOUNTER — Encounter (HOSPITAL_COMMUNITY): Payer: Self-pay

## 2015-01-06 ENCOUNTER — Inpatient Hospital Stay (HOSPITAL_COMMUNITY): Payer: Medicaid Other

## 2015-01-06 DIAGNOSIS — N390 Urinary tract infection, site not specified: Secondary | ICD-10-CM

## 2015-01-06 DIAGNOSIS — R109 Unspecified abdominal pain: Secondary | ICD-10-CM

## 2015-01-06 DIAGNOSIS — R7989 Other specified abnormal findings of blood chemistry: Secondary | ICD-10-CM

## 2015-01-06 DIAGNOSIS — I959 Hypotension, unspecified: Secondary | ICD-10-CM

## 2015-01-06 DIAGNOSIS — D61818 Other pancytopenia: Secondary | ICD-10-CM | POA: Insufficient documentation

## 2015-01-06 LAB — COMPREHENSIVE METABOLIC PANEL
ALT: 69 U/L — ABNORMAL HIGH (ref 0–35)
AST: 96 U/L — ABNORMAL HIGH (ref 0–37)
Albumin: 3.8 g/dL (ref 3.5–5.2)
Alkaline Phosphatase: 85 U/L (ref 39–117)
Anion gap: 4 — ABNORMAL LOW (ref 5–15)
BUN: 6 mg/dL (ref 6–23)
CO2: 25 mmol/L (ref 19–32)
Calcium: 8.5 mg/dL (ref 8.4–10.5)
Chloride: 109 mmol/L (ref 96–112)
Creatinine, Ser: 0.57 mg/dL (ref 0.50–1.10)
GFR calc Af Amer: >90 mL/min (ref >90)
GFR calc non Af Amer: >90 mL/min (ref >90)
Glucose, Bld: 83 mg/dL (ref 70–99)
Potassium: 3.9 mmol/L (ref 3.5–5.1)
Sodium: 138 mmol/L (ref 135–145)
Total Bilirubin: 1.9 mg/dL — ABNORMAL HIGH (ref 0.3–1.2)
Total Protein: 6.1 g/dL (ref 6.0–8.3)

## 2015-01-06 LAB — CBC
HCT: 26.6 % — ABNORMAL LOW (ref 36.0–46.0)
Hemoglobin: 9.5 g/dL — ABNORMAL LOW (ref 12.0–15.0)
MCH: 39.3 pg — ABNORMAL HIGH (ref 26.0–34.0)
MCHC: 35.7 g/dL (ref 30.0–36.0)
MCV: 109.9 fL — ABNORMAL HIGH (ref 78.0–100.0)
Platelets: 100 10*3/uL — ABNORMAL LOW (ref 150–400)
RBC: 2.42 MIL/uL — ABNORMAL LOW (ref 3.87–5.11)
RDW: 14.9 % (ref 11.5–15.5)
WBC: 2 10*3/uL — ABNORMAL LOW (ref 4.0–10.5)

## 2015-01-06 LAB — HAPTOGLOBIN: Haptoglobin: <10 mg/dL — ABNORMAL LOW (ref 34–200)

## 2015-01-06 LAB — LIPASE, BLOOD: Lipase: 20 U/L (ref 11–59)

## 2015-01-06 LAB — HEMOGLOBIN A1C
Hgb A1c MFr Bld: 5.4 % (ref 4.8–5.6)
Mean Plasma Glucose: 108 mg/dL

## 2015-01-06 MED ORDER — IOHEXOL 300 MG/ML  SOLN
25.0000 mL | INTRAMUSCULAR | Status: AC
  Administered 2015-01-06: 25 mL via ORAL

## 2015-01-06 MED ORDER — IOHEXOL 300 MG/ML  SOLN
100.0000 mL | Freq: Once | INTRAMUSCULAR | Status: AC | PRN
  Administered 2015-01-06: 100 mL via INTRAVENOUS

## 2015-01-06 MED ORDER — HEPARIN SODIUM (PORCINE) 5000 UNIT/ML IJ SOLN
5000.0000 [IU] | Freq: Three times a day (TID) | INTRAMUSCULAR | Status: DC
  Administered 2015-01-06 – 2015-01-07 (×2): 5000 [IU] via SUBCUTANEOUS
  Filled 2015-01-06 (×2): qty 1

## 2015-01-06 NOTE — Progress Notes (Signed)
Pt repeatedly asks to leave AMA, MD on call paged.

## 2015-01-06 NOTE — Discharge Summary (Signed)
Name: Jordan [name] MRN: [id number] DOB: [birth year] 49 y.o. PCP: Pcp Not In System  Date of Admission: 01/04/2015  3:49 PM Date of Discharge: 01/07/2015 Attending Physician: Dr. Madilyn Fireman  Discharge Diagnosis: 1. Dizziness, abdominal pain and pancytopenia   Principal Problem:   Other pancytopenia Active Problems:   Bipolar disorder   Hypotension   UTI (urinary tract infection)   Pancytopenia   Abdominal pain  Discharge Medications:   Medication List    STOP taking these medications        cephALEXin 500 MG capsule  Commonly known as:  KEFLEX     HYDROcodone-acetaminophen 5-325 MG per tablet  Commonly known as:  NORCO/VICODIN      TAKE these medications        citalopram 20 MG tablet  Commonly known as:  CELEXA  Take 20 mg by mouth daily.     estrogens (conjugated) 0.625 MG tablet  Commonly known as:  PREMARIN  Take 0.625 mg by mouth daily. Take daily for 21 days then do not take for 7 days.     FUSION PLUS PO  Take 1 capsule by mouth daily.     gemfibrozil 600 MG tablet  Commonly known as:  LOPID  Take 600 mg by mouth 2 (two) times daily before a meal.     ibuprofen 800 MG tablet  Commonly known as:  ADVIL,MOTRIN  Take 800 mg by mouth every 8 (eight) hours as needed for moderate pain.     lamoTRIgine 25 MG tablet  Commonly known as:  LAMICTAL  Take 50 mg by mouth daily. Pt takes a total of 150 mg     lamoTRIgine 100 MG tablet  Commonly known as:  LAMICTAL  Take 100 mg by mouth daily. Pt takes a total of 150 mg     omeprazole 20 MG capsule  Commonly known as:  PRILOSEC  Take 20 mg by mouth daily.        Disposition and follow-up:   Jordan [name] was discharged from [facility] in Stable condition.  At the hospital follow up visit please address:  1.  Dizziness, pancytopenia: We consulted Hematology-Oncology and were recommended to perform a bone marrow biopsy, which will have to be performed in the outpatient setting. We  will try to make an appointment on 01/09/15 as we were not able to do so (policy against making appointment while patient is an inpatient).   Abdominal pain:  We recommend a f/u outpatient GI appointment if persistent symptoms. Negative CT.   2.  Labs / imaging needed at time of follow-up: bone marrow biopsy   3.  Pending labs/ test needing follow-up: none  Follow-up Appointments: Follow-up Information    Follow up with Lillia Corporal, MD. Schedule an appointment as soon as possible for a visit in 1 week.   Specialty:  Specialist   Why:  for hosptial follow up   Contact information:   [address] [place] [address] [phone]       Discharge Instructions: Discharge Instructions    Call MD for:  severe uncontrolled pain    Complete by:  As directed      Diet - low sodium heart healthy    Complete by:  As directed      Discharge instructions    Complete by:  As directed   You will need a bone marrow biopsy.  We will try to help facilitate this and call you on Monday.  Increase activity slowly    Complete by:  As directed            Consultations: Treatment Team:  Annia Belt, MD  Procedures Performed:  Dg Chest 2 View  01/04/2015   CLINICAL DATA:  Chest pain and dyspnea  EXAM: CHEST  2 VIEW  COMPARISON:  08/18/2014  FINDINGS: The heart size and mediastinal contours are within normal limits. Both lungs are clear. The visualized skeletal structures are unremarkable.  IMPRESSION: No active cardiopulmonary disease.   Electronically Signed   By: Andreas Newport M.D.   On: 01/04/2015 22:37   Ct Head Wo Contrast  01/05/2015   CLINICAL DATA:  Dizziness and increased weakness for 3 months. History of bipolar disorder, dizziness.  EXAM: CT HEAD WITHOUT CONTRAST  TECHNIQUE: Contiguous axial images were obtained from the base of the skull through the vertex without intravenous contrast.  COMPARISON:  None.  FINDINGS: The ventricles and sulci are  normal. No intraparenchymal hemorrhage, mass effect nor midline shift. No acute large vascular territory infarcts.  No abnormal extra-axial fluid collections. Basal cisterns are patent. Mild calcific atherosclerosis of the carotid siphon.  No skull fracture. The included ocular globes and orbital contents are non-suspicious. Mild paranasal sinus mucosal thickening without air-fluid levels. Trace RIGHT mastoid effusion. Absent maxillary teeth.  IMPRESSION: No acute intracranial process; mild calcific atherosclerosis.   Electronically Signed   By: Elon Alas   On: 01/05/2015 02:58   Ct Abdomen Pelvis W Contrast  01/06/2015   CLINICAL DATA:  48yof, abd pain, loose stool, and pre bx. Past medical hx includes; Mental disorder; Bipolar 1 disorder; Bipolar 1 disorder; Yeast infection of the vagina; UTI (lower urinary tract infection); Alcohol abuse; Shortness of breath  EXAM: CT ABDOMEN AND PELVIS WITH CONTRAST  TECHNIQUE: Multidetector CT imaging of the abdomen and pelvis was performed using the standard protocol following bolus administration of intravenous contrast.  CONTRAST:  137m OMNIPAQUE IOHEXOL 300 MG/ML  SOLN  COMPARISON:  09/13/2014  FINDINGS: 5.5 mm nodule in the posterior right lower lobe, stable. No other lung base nodules. Linear opacity at both lung bases is consistent with scarring and/or atelectasis. There is mild chronic bronchial wall thickening in the lower lobes.  Small calcifications at the dome of the liver, stable. Mild diffuse fatty infiltration of the liver. Liver otherwise unremarkable.  Spleen is mildly enlarged measuring 15.6 cm x 5.2 cm x 13 cm. It is stable. No splenic mass or focal lesion.  Status post cholecystectomy.  No bile duct dilation.  Normal pancreas.  No adrenal masses.  Stable 15 mm upper pole right renal cyst. No other renal masses or lesions. No convincing stones. No hydronephrosis. Ureters are normal course and in caliber. Bladder is decompressed but otherwise  unremarkable.  Uterus and adnexa are unremarkable.  No pathologically enlarged lymph nodes.  No ascites.  Colon and small bowel are unremarkable.  Normal appendix visualized.  There are disk degenerative changes at L5-S1. No osteoblastic or osteolytic lesions.  IMPRESSION: 1. No acute findings in the abdomen or pelvis. 2. 5.5 mm right lower lobe lung nodule. This is stable from the most recent prior study. It measures 1 mm larger and transverse dimension than it did in January 2012. This minimal degree of change strongly supports a benign etiology 3. Hepatic steatosis similar to the most recent prior study. Stable mild splenomegaly. 4. Stable right upper pole renal cyst. Stable changes from cholecystectomy.   Electronically Signed   By: DLajean Manes  M.D.   On: 01/06/2015 15:56   Admission HPI: Jordan [name] is a 49 year old female with morbid obesity, bipolar disorder who presents with dizziness. For the last 6 months, she reports worsening dizziness that's precipitated by activity, like getting up from when she is sitting or getting into the bathtub. During this interval, she also reports associated dark stools, lower abdominal pain bilaterally, shortness of breath, and pallor but no nausea, vomiting, diarrhea. She reports she went to go see her regular doctor today, Dr. Lillia Corporal, at which point she reports she collapsed on the floor though recalls entire incident. When the ambulance arrived to take her to the hospital, she complained that she could not get up. She reports having similar symptoms before after which she received 1 unit of blood and made her feel much better; she also had a prior endoscopy and colonoscopy though cannot remember they were done nor any abnormal findings. She also recalls being told by Dr. Alphonzo Grieve that she had an ovarian cyst that was found after he applied a cream on her abdomen and was wondering if she could be checked for that as she thinks her symptoms may be related to  that. Historically, she had an endoscopy and colonoscopy in her 20's for gas and severe abdominal pain and recalls that she had "something" removed laparoscopically, but she does not recall any specifics. She is unaware of her family history as both of her parents are dead and she no longer speaks with her siblings. She denies any history of tobacco use and most recent use of alcohol being 2 beers about several weeks ago. She is currently on disability given her bipolar disorder and lives with her boyfriend of 9 years at home though they are not sexually active as she has lost her sex drive with menopause but no prior history of STI's. In the ED, she was found to have lactate 2.9 and and given 2 L normal saline bolus. She was also given Protonix 40 mg IV for prior history of GI bleed though FOBT was negative.  Hospital Course by problem list: Principal Problem:   Other pancytopenia Active Problems:   Bipolar disorder   Hypotension   UTI (urinary tract infection)   Pancytopenia   Abdominal pain   Dizziness, pancytopenia: Patient presented with hemoglobin 10.6 with an MCV of 110, WBCs 2.2 with absolute neutrophil count 1.1, platelets 111. Cell lines have been down since around September 2015. Patient has had some associated symptoms some mild symptoms of dizziness. Communication with PCP reveals that she was at some suspicion for GI bleeding. B61, folic acid levels were normal. Hepatitis A, B, C and HIV were negative. Haptoglobin low at <10, LDL elevated at 710 with an indirect hyperbilirubinemia, high ferritin of 618 with a decreased TIBC of 192. Blood smear showed stable with prominent macrocytosis but no polychromasia, or increased reticulocytes. The low haptoglobin, high indirect bilirubin and high lactate are suggestive of a hemolytic process, but the low reticulocytes suggest a bone marrow process. Another consideration was toxic effects of Lamictal on the bone marrow, which can cause pancytopenia in  less than 1% of cases--consider discontinuing Lamictal as an outpatient. CT head peformed due to the fall in PCP office showed no acute intracranial process. We consulted Hematology-Oncology and were recommended to perform a bone marrow biopsy, which will have to be performed in the outpatient setting.   Abdominal pain: Patient continued to complain of >10/10 diffuse abdominal pain and dysgeusia. A CT abdomen and pelvis  with PO contrast showed no findings to explain abdominal pain. No ovarian cysts noted (patient reporting prior history of this). We recommend a f/u outpatient GI appointment.  Nonalcoholic fatty liver disease: Patient had a mild transaminitis with an AST of 96 ALT 69. Abdominal CT with evidence of hepatic steatosis point to nonalcoholic fatty liver disease as the etiology.  Dysuria: Patient's presentation was consistent with UTI given the symptoms of dysuria, foul smelling urine and UA findings of pyuria. We administered ceftriaxone 1g IV 3 days.   Bipolar disorder: Patient stated that her mood has been good, with ups and downs that do not affect her ability to live her life. Per chart review, patient was hospitalized in January 2011. We continued her home Celexa 20 mg and Lamictal 150 mg.   Morbid obesity: Hemoglobin A1c on admission was 5.4.   Discharge Vitals:   BP 122/68 mmHg  Pulse 87  Temp(Src) 98.7 F (37.1 C) (Oral)  Resp 20  Ht 5' 8"  (1.727 m)  Wt 318 lb 8 oz (144.471 kg)  BMI 48.44 kg/m2  SpO2 98%  LMP 09/13/2014  Discharge Labs:  No results found for this or any previous visit (from the past 24 hour(s)).  Signed: Luan Moore, MD 01/08/2015, 8:25 AM    Services Ordered on Discharge: none Equipment Ordered on Discharge: none

## 2015-01-06 NOTE — Progress Notes (Signed)
CSW (Clinical Education officer, museum) asked to review pt for SNF placement. CSW looked at pt chart, spoke with [facility], and MD. At this time, CSW unable to find placement for pt today and unknown if CSW will be able to find placement in general. Pt payer source, mental health issues, and current medical condition make pt poor candidate for finding placement. CSW did discuss options with supervisor and best plan will be for pt to dc home if medically stable for dc. MD and RNCM aware. CSW signing off.  [place], [place]

## 2015-01-06 NOTE — Progress Notes (Signed)
Chief Complaint: Chief Complaint  Patient presents with  . Weakness  pancytopenia  Referring Physician(s): Dr Beryle Beams  History of Present Illness: Jordan [name] is a 49 y.o. female   Pt with sxs of weakness; fatigue; dizziness x 6 mo Worsening recent weeks Work up reveals pancytopenia and Dr Beryle Beams has recommended bone marrow bx Now scheduled for 3/21 in CT Pt received Hep inj this am   Past Medical History  Diagnosis Date  . Mental disorder   . Bipolar 1 disorder   . Bipolar 1 disorder   . Yeast infection of the vagina   . Bipolar disorder   . Obesity   . UTI (lower urinary tract infection)   . Alcohol abuse   . Shortness of breath dyspnea     Past Surgical History  Procedure Laterality Date  . Cholecystectomy      Allergies: Tramadol and Sulfa antibiotics  Medications: Prior to Admission medications   Medication Sig Start Date End Date Taking? Authorizing Provider  citalopram (CELEXA) 20 MG tablet Take 20 mg by mouth daily.   Yes Historical Provider, MD  estrogens, conjugated, (PREMARIN) 0.625 MG tablet Take 0.625 mg by mouth daily. Take daily for 21 days then do not take for 7 days.   Yes Historical Provider, MD  gemfibrozil (LOPID) 600 MG tablet Take 600 mg by mouth 2 (two) times daily before a meal.   Yes Historical Provider, MD  ibuprofen (ADVIL,MOTRIN) 800 MG tablet Take 800 mg by mouth every 8 (eight) hours as needed for moderate pain.   Yes Historical Provider, MD  Iron-FA-B Cmp-C-Biot-Probiotic (FUSION PLUS PO) Take 1 capsule by mouth daily.   Yes Historical Provider, MD  lamoTRIgine (LAMICTAL) 100 MG tablet Take 100 mg by mouth daily. Pt takes a total of 150 mg   Yes Historical Provider, MD  lamoTRIgine (LAMICTAL) 25 MG tablet Take 50 mg by mouth daily. Pt takes a total of 150 mg   Yes Historical Provider, MD  omeprazole (PRILOSEC) 20 MG capsule Take 20 mg by mouth daily.   Yes Historical Provider, MD  cephALEXin (KEFLEX) 500 MG capsule  Take 1 capsule (500 mg total) by mouth 4 (four) times daily. Patient not taking: Reported on 09/20/2014 08/06/14   Julianne Rice, MD  HYDROcodone-acetaminophen (NORCO/VICODIN) 5-325 MG per tablet Take 1 tablet by mouth every 6 (six) hours as needed for moderate pain. 10/23/14   Domenic Moras, PA-C     History reviewed. No pertinent family history.  History   Social History  . Marital Status: Single    Spouse Name: N/A  . Number of Children: N/A  . Years of Education: N/A   Social History Main Topics  . Smoking status: Never Smoker   . Smokeless tobacco: Never Used  . Alcohol Use: No  . Drug Use: No  . Sexual Activity: Yes    Birth Control/ Protection: None   Other Topics Concern  . None   Social History Narrative    Review of Systems: A 12 point ROS discussed and pertinent positives are indicated in the HPI above.  All other systems are negative.  Review of Systems  Constitutional: Positive for appetite change and fatigue. Negative for fever, activity change and unexpected weight change.  Respiratory: Negative for cough.   Gastrointestinal: Negative for abdominal pain.  Genitourinary: Negative for difficulty urinating.  Neurological: Positive for dizziness, weakness and light-headedness.  Psychiatric/Behavioral: Negative for behavioral problems and confusion.    Vital Signs: BP 118/89 mmHg  Pulse  82  Temp(Src) 98.3 F (36.8 C) (Oral)  Resp 20  Ht 5\' 8"  (1.727 m)  Wt 145.196 kg (320 lb 1.6 oz)  BMI 48.68 kg/m2  SpO2 99%  LMP 09/13/2014  Physical Exam  Constitutional: She is oriented to person, place, and time. She appears well-nourished.  Obese   Cardiovascular: Normal rate, regular rhythm and normal heart sounds.   No murmur heard. Pulmonary/Chest: Effort normal. She has wheezes.  Abdominal: Soft. Bowel sounds are normal. There is no tenderness.  Musculoskeletal: Normal range of motion.  Neurological: She is alert and oriented to person, place, and time.    Skin: Skin is warm and dry.  Psychiatric: She has a normal mood and affect. Her behavior is normal. Judgment and thought content normal.  Nursing note and vitals reviewed.   Mallampati Score:  MD Evaluation Airway: WNL Heart: WNL Abdomen: WNL Chest/ Lungs: WNL ASA  Classification: 2 Mallampati/Airway Score: Two  Imaging: Dg Chest 2 View  01/04/2015   CLINICAL DATA:  Chest pain and dyspnea  EXAM: CHEST  2 VIEW  COMPARISON:  08/18/2014  FINDINGS: The heart size and mediastinal contours are within normal limits. Both lungs are clear. The visualized skeletal structures are unremarkable.  IMPRESSION: No active cardiopulmonary disease.   Electronically Signed   By: Andreas Newport M.D.   On: 01/04/2015 22:37   Ct Head Wo Contrast  01/05/2015   CLINICAL DATA:  Dizziness and increased weakness for 3 months. History of bipolar disorder, dizziness.  EXAM: CT HEAD WITHOUT CONTRAST  TECHNIQUE: Contiguous axial images were obtained from the base of the skull through the vertex without intravenous contrast.  COMPARISON:  None.  FINDINGS: The ventricles and sulci are normal. No intraparenchymal hemorrhage, mass effect nor midline shift. No acute large vascular territory infarcts.  No abnormal extra-axial fluid collections. Basal cisterns are patent. Mild calcific atherosclerosis of the carotid siphon.  No skull fracture. The included ocular globes and orbital contents are non-suspicious. Mild paranasal sinus mucosal thickening without air-fluid levels. Trace RIGHT mastoid effusion. Absent maxillary teeth.  IMPRESSION: No acute intracranial process; mild calcific atherosclerosis.   Electronically Signed   By: Elon Alas   On: 01/05/2015 02:58    Labs:  CBC:  Recent Labs  09/20/14 2147 01/04/15 1609 01/05/15 1040 01/06/15 0410  WBC 2.8* 2.2* 1.9* 2.0*  HGB 8.3* 10.6* 8.7* 9.5*  HCT 24.7* 29.9* 24.7* 26.6*  PLT 161 111* 106* 100*    COAGS:  Recent Labs  01/04/15 1609  01/05/15 1040  INR 1.01 1.10    BMP:  Recent Labs  09/20/14 2147 01/04/15 1609 01/05/15 1306 01/06/15 0410  NA 141 138 139 138  K 4.2 3.6 3.7 3.9  CL 105 105 110 109  CO2 22 24 22 25   GLUCOSE 109* 95 93 83  BUN 13 10 9 6   CALCIUM 9.3 9.2 8.4 8.5  CREATININE 0.68 0.76 0.59 0.57  GFRNONAA >90 >90 >90 >90  GFRAA >90 >90 >90 >90    LIVER FUNCTION TESTS:  Recent Labs  09/20/14 2147 01/04/15 1609 01/05/15 1040 01/05/15 1306 01/06/15 0410  BILITOT 1.8* 2.3* 1.8* 1.9* 1.9*  AST 62* 85*  --  70* 96*  ALT 36* 69*  --  56* 69*  ALKPHOS 84 89  --  71 85  PROT 6.3 6.4  --  5.5* 6.1  ALBUMIN 3.5 4.0  --  3.4* 3.8    TUMOR MARKERS: No results for input(s): AFPTM, CEA, CA199, CHROMGRNA in the last 8760  hours.  Assessment and Plan:  Pancytopenia For BM bx in CT 3/21 Will hold Hep inj 3/21 Risks and Benefits discussed with the patient including, but not limited to bleeding, infection, damage to adjacent structures or low yield requiring additional tests. All of the patient's questions were answered, patient is agreeable to proceed. Consent signed and in chart.   Thank you for this interesting consult.  I greatly enjoyed meeting Jordan [name] and look forward to participating in their care.  Signed: Zhana Jeangilles A 01/06/2015, 11:30 AM   I spent a total of 20 Minutes  in face to face in clinical consultation, greater than 50% of which was counseling/coordinating care for BM Bx

## 2015-01-06 NOTE — Progress Notes (Signed)
 Subjective:  Patient is reporting no acute overnight but states some slight worsening of her dizziness and abdominal pain. She states that this is inhibiting her activities except difficult for her to sit up. She also states that her abdominal pain is diffuse and greater than 10 out of 10 which is stable from admission. Patient is asking for food to eat.  Objective: Vital signs in last 24 hours: Filed Vitals:   01/05/15 1959 01/06/15 0016 01/06/15 0506 01/06/15 0743  BP: 108/60 117/45 103/63 118/89  Pulse: 90 93 85 82  Temp: 97.7 F (36.5 C) 99 F (37.2 C) 98.3 F (36.8 C) 98.3 F (36.8 C)  TempSrc: Oral Oral Oral Oral  Resp: 18 18 18 20  Height:      Weight:   320 lb 1.6 oz (145.196 kg)   SpO2: 100% 96% 100% 99%   Weight change: -29 lb 14.4 oz (-13.563 kg) No intake or output data in the 24 hours ending 01/06/15 0837  General: resting in bed, in no acute distress HEENT: PERRL, EOMI, no scleral icterus Cardiac: RRR, no rubs, murmurs or gallops Pulm: clear to auscultation bilaterally, moving normal volumes of air Abd: Mild tenderness to palpation, mild guarding, no grimace, nondistended, BS present Ext: warm and well perfused, no pedal edema Neuro: alert and oriented X3, cranial nerves II-XII grossly intact Skin: no rashes or lesions noted Psych: appropriate affect  Lab Results: Basic Metabolic Panel:  Recent Labs Lab 01/05/15 1306 01/06/15 0410  NA 139 138  K 3.7 3.9  CL 110 109  CO2 22 25  GLUCOSE 93 83  BUN 9 6  CREATININE 0.59 0.57  CALCIUM 8.4 8.5   Liver Function Tests:  Recent Labs Lab 01/05/15 1306 01/06/15 0410  AST 70* 96*  ALT 56* 69*  ALKPHOS 71 85  BILITOT 1.9* 1.9*  PROT 5.5* 6.1  ALBUMIN 3.4* 3.8   No results for input(s): LIPASE, AMYLASE in the last 168 hours. No results for input(s): AMMONIA in the last 168 hours. CBC:  Recent Labs Lab 01/04/15 1609 01/05/15 1040 01/06/15 0410  WBC 2.2* 1.9* 2.0*  NEUTROABS 1.1*  --   --     HGB 10.6* 8.7* 9.5*  HCT 29.9* 24.7* 26.6*  MCV 110.7* 110.8* 109.9*  PLT 111* 106* 100*   Cardiac Enzymes: No results for input(s): CKTOTAL, CKMB, CKMBINDEX, TROPONINI in the last 168 hours. BNP: No results for input(s): PROBNP in the last 168 hours. D-Dimer: No results for input(s): DDIMER in the last 168 hours. CBG: No results for input(s): GLUCAP in the last 168 hours. Hemoglobin A1C:  Recent Labs Lab 01/05/15 1040  HGBA1C 5.4   Fasting Lipid Panel: No results for input(s): CHOL, HDL, LDLCALC, TRIG, CHOLHDL, LDLDIRECT in the last 168 hours. Thyroid Function Tests:  Recent Labs Lab 01/05/15 1040  TSH 2.462   Coagulation:  Recent Labs Lab 01/04/15 1609 01/05/15 1040  LABPROT 13.4 14.3  INR 1.01 1.10   Anemia Panel:  Recent Labs Lab 01/05/15 1040  VITAMINB12 347  FOLATE >20.0  FERRITIN 618*  TIBC 192*  IRON 120  RETICCTPCT 1.0   Urine Drug Screen: Drugs of Abuse     Component Value Date/Time   LABOPIA NONE DETECTED 01/04/2015 1625   COCAINSCRNUR NONE DETECTED 01/04/2015 1625   LABBENZ NONE DETECTED 01/04/2015 1625   AMPHETMU NONE DETECTED 01/04/2015 1625   THCU NONE DETECTED 01/04/2015 1625   LABBARB NONE DETECTED 01/04/2015 1625    Alcohol Level: No results for input(s):   ETH in the last 168 hours. Urinalysis:  Recent Labs Lab 01/04/15 1600  COLORURINE ORANGE*  LABSPEC 1.030  PHURINE 6.0  GLUCOSEU NEGATIVE  HGBUR SMALL*  BILIRUBINUR MODERATE*  KETONESUR 15*  PROTEINUR 30*  UROBILINOGEN >8.0*  NITRITE POSITIVE*  LEUKOCYTESUR LARGE*   Micro Results: Recent Results (from the past 240 hour(s))  MRSA PCR Screening     Status: None   Collection Time: 01/05/15 12:33 PM  Result Value Ref Range Status   MRSA by PCR NEGATIVE NEGATIVE Final    Comment:        The GeneXpert MRSA Assay (FDA approved for NASAL specimens only), is one component of a comprehensive MRSA colonization surveillance program. It is not intended to diagnose  MRSA infection nor to guide or monitor treatment for MRSA infections.    Studies/Results: Dg Chest 2 View  01/04/2015   CLINICAL DATA:  Chest pain and dyspnea  EXAM: CHEST  2 VIEW  COMPARISON:  08/18/2014  FINDINGS: The heart size and mediastinal contours are within normal limits. Both lungs are clear. The visualized skeletal structures are unremarkable.  IMPRESSION: No active cardiopulmonary disease.   Electronically Signed   By: Andreas Newport M.D.   On: 01/04/2015 22:37   Ct Head Wo Contrast  01/05/2015   CLINICAL DATA:  Dizziness and increased weakness for 3 months. History of bipolar disorder, dizziness.  EXAM: CT HEAD WITHOUT CONTRAST  TECHNIQUE: Contiguous axial images were obtained from the base of the skull through the vertex without intravenous contrast.  COMPARISON:  None.  FINDINGS: The ventricles and sulci are normal. No intraparenchymal hemorrhage, mass effect nor midline shift. No acute large vascular territory infarcts.  No abnormal extra-axial fluid collections. Basal cisterns are patent. Mild calcific atherosclerosis of the carotid siphon.  No skull fracture. The included ocular globes and orbital contents are non-suspicious. Mild paranasal sinus mucosal thickening without air-fluid levels. Trace RIGHT mastoid effusion. Absent maxillary teeth.  IMPRESSION: No acute intracranial process; mild calcific atherosclerosis.   Electronically Signed   By: Elon Alas   On: 01/05/2015 02:58   Medications: I have reviewed the patient's current medications. Scheduled Meds: . cefTRIAXone (ROCEPHIN)  IV  1 g Intravenous Q24H  . citalopram  20 mg Oral Daily  . heparin  5,000 Units Subcutaneous 3 times per day  . lamoTRIgine  150 mg Oral Daily  . pantoprazole  40 mg Oral Daily  . sodium chloride  3 mL Intravenous Q12H   Continuous Infusions:   PRN Meds:. Assessment/Plan: Principal Problem:   Other pancytopenia Active Problems:   Bipolar disorder   Hypotension   UTI (urinary  tract infection)  Pancytopenia: Patient still reporting persistent dizziness. Overall, patient's blood counts have remained stable. Hemoglobin stable at 9.5 from 8.7. White count stable at 2.0 from 1.9. Platelets stable at 100 from 106. It seems likely that there is some intra-marrow hemolytic process. LDH is elevated with a depressed haptoglobin with no significant signs of hemolysis on peripheral smear. Otherwise, anemia workup thus far has been largely negative. Vitamin B-12 within normal limits. Hepatitis panel within normal limits. Folate within normal limits. Iron within normal limits. -Appreciate consultation from Dr. Beryle Beams of hematology oncology  -I have touched base with interventional radiology. Unable to schedule bone marrow biopsy for today. Next availability would be on Monday. Will consider options of outpatient workup. Interventional radiology to evaluate patient today in the case that patient stays inpatient over the weekend. -Consider discontinuation of Lamictal given extant though very rare incidence  of pancytopenia (less than 1%). -HIV antibody still pending.  Abdominal pain: Patient reporting severe abdominal pain throughout her abdomen that has slightly gotten worse such her initial presentation. Physical exam is reassuring. Patient has a normal CT scan from November 2015. Patient does report some history of ovarian cysts. Patient has had this sort of presentation before, last noted in December 2015 and was treated for a urinary tract infection at that time. -Lipase -CT abdomen and pelvis  Abnormal LFTs: AST and ALT overall stable at 96 and 69 respectively. There is a elevation in bilirubin although fractionation from yesterday showing predominance of unconjugated bilirubin. Hepatitis panel negative. There is no evidence of hepatic steatosis from CT abdomen from November 2015. -Workup for malignant infiltration as above.  UTI: Patient reporting dysuria with consistent  urinalysis findings.  -Continue ceftriaxone 1 g IV day 3 of 3. -Urine culture pending  Bipolar disorder: Per chart review, she was hospitalized in January 2011. Her medications include Celexa 20 mg and Lamictal 150 mg though patient cannot confirm dose documented for either medication. -Continue home medications for now, though consider discontinuation of Lamictal.  Morbid obesity: Hemoglobin A1c of 5.4.   #FEN:  -Diet: Heart healthy  #DVT prophylaxis: Heparin  #CODE STATUS: FULL CODE -Defer to boyfriend Roseanne Reno [phone] if patients lacks decision-making capacity -Confirmed with patient on admission  Dispo: Disposition is deferred at this time, awaiting improvement of current medical problems.   The patient does have a current PCP  and does not need an [facility] hospital follow-up appointment after discharge.  The patient does not know have transportation limitations that hinder transportation to clinic appointments.   LOS: 2 days   Services Needed at time of discharge: Y = Yes, Blank = No PT:   OT:   RN:   Equipment:   Other:    Luan Moore, MD 01/06/2015, 8:37 AM

## 2015-01-06 NOTE — Evaluation (Signed)
Physical Therapy Evaluation Patient Details Name: Jordan [name] MRN: [id number] DOB: 1965/12/06 Today's Date: 01/06/2015   History of Present Illness  Jordan [name] is a 49 year old female with morbid obesity, bipolar disorder who presents with dizziness, abdominal pain and decreased appetite. For the last 6 months, she reports worsening positional dizziness, worsened on standing up. During this interval, she also reports black stools, stabbing 10/10 lower abdominal pain bilaterally, low appetite due to the abdominal pain, shortness of breath, and pallor but no nausea, vomiting, diarrhea. Her PCP was been concerned for a GI bleed and has referred her to GI, but she has not followed up with them. Approximately 3 months ago she got a blood transfusion and her dizziness and appetite improved, but these improvements did not last and she has had a recurrence of her symptoms for the past 2-3 months. She reports she collapsed on the floor at her home yesterday when she got up to go see her primary care physician, although she recalls the entire incident. I spoke with the PCP's office and they confirmed that Jordan [name] had come in to their office yesterday for a psychiatric medication visit, was very pale and lightheaded in their office with a systolic BP in the 00'T. They called the ambulance to take her to the hospital and during transfer she fell and hit her head and shoulder on the stretcher.   Clinical Impression  Pt admitted with dizziness and abdominal pain.  This therapist has tried on 2 occasions to get patient to participate with therapies.  After extensive discussion along with MD assist, pt agreed to allow PT to assess dizziness to determine if her problem was positional vertigo.   However,today pt unable to cooperate with testing from an anxiety/behavioral standpoint.  Maybe another vestibular- trained therapist can try again on Monday to see if she can participate, otherwise I don't think we have  anything to offer.    Follow Up Recommendations      Equipment Recommendations  None recommended by PT    Recommendations for Other Services       Precautions / Restrictions Precautions Precautions: Fall      Mobility  Bed Mobility Overal bed mobility: Needs Assistance Bed Mobility: Supine to Sit;Sit to Supine     Supine to sit: Min guard Sit to supine: Min guard   General bed mobility comments: pt moves with extreme caution and assisting or trying to help her relax during the mobility makes no apparent difference to her overall level of anxiety.  Transfers Overall transfer level: Needs assistance Equipment used: 1 person hand held assist Transfers: Sit to/from Stand Sit to Stand: Min assist         General transfer comment: pt is easily assisted, but she becomes disproportionately more anxious (and dizzier) as she stays up.  Person assisting unable to reduce her anxieties  Ambulation/Gait Ambulation/Gait assistance: Min assist Ambulation Distance (Feet): 20 Feet Assistive device: 1 person hand held assist Gait Pattern/deviations: Step-through pattern     General Gait Details: mildly unsteady  Stairs            Wheelchair Mobility    Modified Rankin (Stroke Patients Only)       Balance Overall balance assessment: Needs assistance Sitting-balance support: No upper extremity supported Sitting balance-Leahy Scale: Fair Sitting balance - Comments: pt able to maintain balance without assist but is too anxious to try for long   Standing balance support: No upper extremity supported Standing balance-Leahy Scale: Poor  Pertinent Vitals/Pain Pain Assessment: Faces Faces Pain Scale: No hurt    Home Living Family/patient expects to be discharged to:: Private residence Living Arrangements: Non-relatives/Friends           Home Layout: One level        Prior Function           Comments: normally  able to get up to Jordan [name] or toilet without assist, but now perseverates on  falling due to dizziness that now prevents her from mobilizing only when she must and with assist     Hand Dominance        Extremity/Trunk Assessment               Lower Extremity Assessment: Overall WFL for tasks assessed;Generalized weakness         Communication   Communication: No difficulties  Cognition     Overall Cognitive Status: History of cognitive impairments - at baseline                      General Comments General comments (skin integrity, edema, etc.): Per her description of dizziness today, pt sounds like her source could be due to positional vertigo.  MD and therapist got pt to try test for BPPV, but when testing actually began, pt unable to tolerate either Hallpike or modified Hallpike.  Pt can not let herself complete the testing if it means dizziness might occur.  If she remains on acute until monday, we may try again.    Exercises        Assessment/Plan    PT Assessment Patient needs continued PT services  PT Diagnosis Generalized weakness;Other (comment) (dizziness/?vertigo)   PT Problem List Decreased strength;Decreased activity tolerance;Decreased balance;Decreased cognition;Decreased knowledge of precautions;Other (comment) (?vertigo)  PT Treatment Interventions Gait training;Functional mobility training;Therapeutic activities;Other (comment) (vestibular testing)   PT Goals (Current goals can be found in the Care Plan section) Acute Rehab PT Goals Patient Stated Goal: non stated PT Goal Formulation: With patient Time For Goal Achievement: 01/13/15 Potential to Achieve Goals: Fair    Frequency Min 2X/week   Barriers to discharge        Co-evaluation               End of Session   Activity Tolerance: Other (comment) (Behaviorally, pt unable to participate in vertigo testing) Patient left: in bed;with call bell/phone within reach;with  family/visitor present Nurse Communication: Mobility status         Time: 1410-1440 PT Time Calculation (min) (ACUTE ONLY): 30 min   Charges:   PT Evaluation $Initial PT Evaluation Tier I: 1 Procedure PT Treatments $Therapeutic Activity: 8-22 mins   PT G Codes:        Jordan [name], Jordan [name] 01/06/2015, 4:52 PM 01/06/2015  Donnella Sham, [place] [phone]  (pager)

## 2015-01-06 NOTE — Progress Notes (Signed)
Subjective: Jordan [name] had no acute events overnight but states her dizziness and abdominal pain are now worse. She is now too dizzy to sit up, whereas yesterday the dizziness was only bothersome when she stood up. She describes the dizziness as feeling weak and seeing the room spin, and states that the spinning is worse when she turns her head sharply to either side. She states that her abdominal pain is worse than yesterday, all throughout her stomach and "way more than 10/10." She is still unable to eat because "everything tastes like crap," but still has a good appetite, as she repeatedly asked when she would have her bone marrow biopsy so that she could try to eat.   Objective: Vital signs in last 24 hours: Filed Vitals:   01/05/15 1959 01/06/15 0016 01/06/15 0506 01/06/15 0743  BP: 108/60 117/45 103/63 118/89  Pulse: 90 93 85 82  Temp: 97.7 F (36.5 C) 99 F (37.2 C) 98.3 F (36.8 C) 98.3 F (36.8 C)  TempSrc: Oral Oral Oral Oral  Resp: 18 18 18 20   Height:      Weight:   145.196 kg (320 lb 1.6 oz)   SpO2: 100% 96% 100% 99%   Weight change: -13.563 kg (-29 lb 14.4 oz) No intake or output data in the 24 hours ending 01/06/15 0845 Gen: tired-appearing, pale and anxious CV: RRR Pulm: CTAB, patient got very dizzy when she sat up Abd: tender in all quadrants, nondistended, normoactive bowel sounds   Lab Results:  CBC Latest Ref Rng 01/06/2015 01/05/2015 01/04/2015  WBC 4.0 - 10.5 K/uL 2.0(L) 1.9(L) 2.2(L)  Hemoglobin 12.0 - 15.0 g/dL 9.5(L) 8.7(L) 10.6(L)  Hematocrit 36.0 - 46.0 % 26.6(L) 24.7(L) 29.9(L)  Platelets 150 - 400 K/uL 100(L) 106(L) 111(L)   CMP Latest Ref Rng 01/06/2015 01/05/2015 01/05/2015  Glucose 70 - 99 mg/dL 83 93 -  BUN 6 - 23 mg/dL 6 9 -  Creatinine 0.50 - 1.10 mg/dL 0.57 0.59 -  Sodium 135 - 145 mmol/L 138 139 -  Potassium 3.5 - 5.1 mmol/L 3.9 3.7 -  Chloride 96 - 112 mmol/L 109 110 -  CO2 19 - 32 mmol/L 25 22 -  Calcium 8.4 - 10.5 mg/dL 8.5 8.4 -  Total  Protein 6.0 - 8.3 g/dL 6.1 5.5(L) -  Total Bilirubin 0.3 - 1.2 mg/dL 1.9(H) 1.9(H) 1.8(H)  Alkaline Phos 39 - 117 U/L 85 71 -  AST 0 - 37 U/L 96(H) 70(H) -  ALT 0 - 35 U/L 69(H) 56(H) -   Iron/TIBC/Ferritin/ %Sat    Component Value Date/Time   IRON 120 01/05/2015 1040   TIBC 192* 01/05/2015 1040   FERRITIN 618* 01/05/2015 1040   IRONPCTSAT 63* 01/05/2015 1040   Negative Hep AS, B, C, MRSA B12 347 Folate >20 LDH 712 Haptoglobin <10  Studies/Results: Dg Chest 2 View  01/04/2015   CLINICAL DATA:  Chest pain and dyspnea  EXAM: CHEST  2 VIEW  COMPARISON:  08/18/2014  FINDINGS: The heart size and mediastinal contours are within normal limits. Both lungs are clear. The visualized skeletal structures are unremarkable.  IMPRESSION: No active cardiopulmonary disease.   Electronically Signed   By: Andreas Newport M.D.   On: 01/04/2015 22:37   Ct Head Wo Contrast  01/05/2015   CLINICAL DATA:  Dizziness and increased weakness for 3 months. History of bipolar disorder, dizziness.  EXAM: CT HEAD WITHOUT CONTRAST  TECHNIQUE: Contiguous axial images were obtained from the base of the skull through the vertex  without intravenous contrast.  COMPARISON:  None.  FINDINGS: The ventricles and sulci are normal. No intraparenchymal hemorrhage, mass effect nor midline shift. No acute large vascular territory infarcts.  No abnormal extra-axial fluid collections. Basal cisterns are patent. Mild calcific atherosclerosis of the carotid siphon.  No skull fracture. The included ocular globes and orbital contents are non-suspicious. Mild paranasal sinus mucosal thickening without air-fluid levels. Trace RIGHT mastoid effusion. Absent maxillary teeth.  IMPRESSION: No acute intracranial process; mild calcific atherosclerosis.   Electronically Signed   By: Elon Alas   On: 01/05/2015 02:58   Medications:  Scheduled Meds: . cefTRIAXone (ROCEPHIN)  IV  1 g Intravenous Q24H  . citalopram  20 mg Oral Daily  .  heparin  5,000 Units Subcutaneous 3 times per day  . lamoTRIgine  150 mg Oral Daily  . pantoprazole  40 mg Oral Daily  . sodium chloride  3 mL Intravenous Q12H   Continuous Infusions:  PRN Meds:. Assessment/Plan: Principal Problem:   Other pancytopenia Active Problems:   Bipolar disorder   Hypotension   UTI (urinary tract infection)  Dizziness, pancytopenia: Worse than yesterday in spite of having received IV fluids and having normal blood pressures overnight. Blood counts stable with prominent macrocytosis but no polychromasia, or increased reticulocytes. The low haptoglobin, high indirect bilirubin and high lactate are suggestive of a hemolytic process, but the low reticulocytes suggest a bone marrow process. - IR unable to do bone marrow biopsy today, will schedule BM biopsy as an outpatient  Abdominal pain: continued >10/10 diffuse abdominal pain and dysgeusia.  - CT abdomen and pelvis with PO contrast  - outpatient GI appointment  - will speak to patient regarding obtaining records from her gynecologist  Abnormal LFTs: AST 96 ALT 69 (slightly increased from yesterday.: Total bilirubin 1.8, indirect bili 1.3, direct bili 0.8. Possibly contributory to her anemia though unclear. Abdominal CT 09/13/2014 notable for calcified granuloma identified within the liver and prior cholecystectomy but no splenomegaly. - Continue to monitor, f/u today's CT   Dysuria: Consistent with UTI given the symptoms of dysuria, foul smelling urine and UA findings of pyuria. -S/p ceftriaxone 1 g IV 3 days  Bipolar disorder: Patient states that her mood is good, with ups and downs that do not affect her ability to live her life. Per chart review, patient was hospitalized in January 2011. Her medications include Celexa 20 mg and Lamictal 150 mg. Her PCP is managing her psychiatric medications and communicated to [name] that patient's Lamictal has consistently been sub therapeutic but she has not allowed them to  increase it or discontinue it because she states that it helps her. -Continue home medications  Morbid obesity: Check A1c  #FEN:  -Diet: Heart healthy  #DVT prophylaxis: Heparin  #CODE STATUS: FULL CODE  Dispo: Likely discharge today after CT with outpatient f/u with GI and outpatient bone marrow biopsy   This is a Medical Student Note.  The care of the patient was discussed with Dr. Raelene Bott and the assessment and plan formulated with their assistance.  Please see their attached note for official documentation of the daily encounter.   LOS: 2 days   Kathi Simpers, Med Student 01/06/2015, 8:45 AM

## 2015-01-06 NOTE — Progress Notes (Signed)
On-call MDs went to go see patient in her room. She was very concerned about the results of her tests. She also felt that she was not ready to go home given her dizziness and symptoms that she has been experiencing for the last 7 months. She is also distraught that her regular doctor has been working her up without any answers, and she is very tired of waiting. She feels that she leaves though that she may not get her bone marrow biopsy and time. She is also concerned about the cost incurred by staying longer than she needs to as she has Medicaid and is on disability.   Her boyfriend Roseanne Reno later joined [name] in the room and also attempted to allay her anxieties as did we. It appear that she may have been somewhat confused as she was told by different people that she couldn't stay in the hospital but then also couldn't go home. We explained to her the findings of her imaging studies today which were otherwise unremarkable and reiterated that the decision for her discharge would be left to the day team. We encouraged her to stay for their decision.

## 2015-01-06 NOTE — Evaluation (Signed)
Occupational Therapy Evaluation Patient Details Name: Jordan [name] MRN: [id number] DOB: 11/14/1965 Today's Date: 01/06/2015    History of Present Illness Pt is a 49 y.o. Female with PMH morbid obesity, bipolar disorder admitted 01/04/15 with c/o dizziness, dark stool, lower abdominal pain, and SOB. Pt collapsed to floor at PCP office and was brought to the ED via EMS.    Clinical Impression   PTA pt lived at home with her boyfriend and reports that she was independent with mobility to BSC/toilet but required assistance for ADLs. Pt is currently limited by dizziness and has not been mobile as she has been self-limiting due to dizziness. Noted that PT attempted hallpike dix and modified hallpike without success. Pt laid flat in supine and performed roll test for BPPV with positive mild nystagmus to left side. PT/OT will attempt Epley Maneuver as available to attempt to adjust for BPPV. Feel that pt would be a fall risk if she were to go home at this time and would recommend another night in the hospital. Pt's partner has a pacemaker and would be unable to physically assist pt if she were to fall.     Follow Up Recommendations  Supervision/Assistance - 24 hour;No OT follow up    Equipment Recommendations  None recommended by OT    Recommendations for Other Services       Precautions / Restrictions Precautions Precautions: Fall Restrictions Weight Bearing Restrictions: No      Mobility Bed Mobility Overal bed mobility: Needs Assistance Bed Mobility: Rolling;Supine to Sit;Sit to Supine Rolling: Supervision   Supine to sit: Min guard Sit to supine: Min guard   General bed mobility comments: Pt moves slowly and with caution. Grabs bedrails if bed is moved too quickly.   Transfers Overall transfer level: Needs assistance Equipment used: 1 person hand held assist Transfers: Sit to/from Stand Sit to Stand: Min assist         General transfer comment: pt is easily assisted, but  she becomes disproportionately more anxious (and dizzier) as she stays up.  Person assisting unable to reduce her anxieties    Balance Overall balance assessment: Needs assistance Sitting-balance support: No upper extremity supported Sitting balance-Leahy Scale: Fair Sitting balance - Comments: pt able to maintain balance without assist but is too anxious to try for long   Standing balance support: No upper extremity supported Standing balance-Leahy Scale: Poor                              ADL Overall ADL's : Needs assistance/impaired Eating/Feeding: Independent;Bed level   Grooming: Set up;Bed level   Upper Body Bathing: Set up;Sitting   Lower Body Bathing: Maximal assistance;Sit to/from stand   Upper Body Dressing : Set up;Sitting   Lower Body Dressing: Total assistance;+2 for safety/equipment;Sit to/from stand   Toilet Transfer: Minimal assistance;+2 for safety/equipment;Stand-pivot             General ADL Comments: Pt dizzy and with increased anxiety. Provided therapeutic use of self to calm pt. Discussed possible vertigo and she reports PT was unable to perform "those tests" (hallpike dix or modified). Pt laid flat in bed and had mild positive nystagmus on left side with roll test.      Vision Vision Assessment?: No apparent visual deficits          Pertinent Vitals/Pain Pain Assessment: No/denies pain Faces Pain Scale: No hurt     Hand Dominance  Extremity/Trunk Assessment Upper Extremity Assessment Upper Extremity Assessment: Overall WFL for tasks assessed   Lower Extremity Assessment Lower Extremity Assessment: Defer to PT evaluation   Cervical / Trunk Assessment Cervical / Trunk Assessment: Normal   Communication Communication Communication: No difficulties   Cognition Arousal/Alertness: Awake/alert Behavior During Therapy: WFL for tasks assessed/performed;Anxious Overall Cognitive Status: History of cognitive impairments - at  baseline                                Home Living Family/patient expects to be discharged to:: Private residence Living Arrangements: Spouse/significant other (boyfriend of 9 years)           Home Layout: One level                          Prior Functioning/Environment          Comments: Pt is normally able to get up to [facility] without assistance but is perseverating on fear of falling due to dizziness.     OT Diagnosis: Other (comment);Generalized weakness (dizziness)   OT Problem List: Decreased activity tolerance;Impaired balance (sitting and/or standing);Other (comment);Obesity (dizziness)   OT Treatment/Interventions: Self-care/ADL training;Therapeutic exercise;Energy conservation;DME and/or AE instruction;Therapeutic activities;Patient/family education;Balance training    OT Goals(Current goals can be found in the care plan section) Acute Rehab OT Goals Patient Stated Goal: to find out what's wrong with me OT Goal Formulation: With patient Time For Goal Achievement: 01/20/15 Potential to Achieve Goals: Good ADL Goals Pt Will Perform Grooming: with supervision;standing Pt Will Transfer to Toilet: with supervision;ambulating Pt Will Perform Toileting - Clothing Manipulation and hygiene: with supervision;sit to/from stand  OT Frequency: Min 2X/week   Barriers to D/C:    Pt is a fall risk due to dizziness. Her partner has a pacemaker and due to her size, he is unable to assist her off the floor.           End of Session Nurse Communication: Other (comment) (pt appears to be positive for BPPV)  Activity Tolerance: Patient tolerated treatment well Patient left: in bed;with call bell/phone within reach   Time: 1700-1732 OT Time Calculation (min): 32 min Charges:  OT General Charges $OT Visit: 1 Procedure OT Evaluation $Initial OT Evaluation Tier I: 1 Procedure OT Treatments $Self Care/Home Management : 8-22 mins G-Codes:    Villa Herb  M 01/31/15, 6:08 PM  Cyndie Chime, OTR/L Occupational Therapist [phone] (pager)

## 2015-01-07 LAB — COMPREHENSIVE METABOLIC PANEL
ALT: 70 U/L — ABNORMAL HIGH (ref 0–35)
AST: 88 U/L — ABNORMAL HIGH (ref 0–37)
Albumin: 3.6 g/dL (ref 3.5–5.2)
Alkaline Phosphatase: 81 U/L (ref 39–117)
Anion gap: 6 (ref 5–15)
BUN: <5 mg/dL — ABNORMAL LOW (ref 6–23)
CO2: 23 mmol/L (ref 19–32)
Calcium: 8.9 mg/dL (ref 8.4–10.5)
Chloride: 110 mmol/L (ref 96–112)
Creatinine, Ser: 0.58 mg/dL (ref 0.50–1.10)
GFR calc Af Amer: >90 mL/min (ref >90)
GFR calc non Af Amer: >90 mL/min (ref >90)
Glucose, Bld: 86 mg/dL (ref 70–99)
Potassium: 3.5 mmol/L (ref 3.5–5.1)
Sodium: 139 mmol/L (ref 135–145)
Total Bilirubin: 1.6 mg/dL — ABNORMAL HIGH (ref 0.3–1.2)
Total Protein: 5.9 g/dL — ABNORMAL LOW (ref 6.0–8.3)

## 2015-01-07 LAB — CBC
HCT: 25.1 % — ABNORMAL LOW (ref 36.0–46.0)
Hemoglobin: 8.9 g/dL — ABNORMAL LOW (ref 12.0–15.0)
MCH: 38.5 pg — ABNORMAL HIGH (ref 26.0–34.0)
MCHC: 35.5 g/dL (ref 30.0–36.0)
MCV: 108.7 fL — ABNORMAL HIGH (ref 78.0–100.0)
Platelets: 101 10*3/uL — ABNORMAL LOW (ref 150–400)
RBC: 2.31 MIL/uL — ABNORMAL LOW (ref 3.87–5.11)
RDW: 15.2 % (ref 11.5–15.5)
WBC: 2.1 10*3/uL — ABNORMAL LOW (ref 4.0–10.5)

## 2015-01-07 NOTE — Progress Notes (Signed)
Pt now states she wishes to remain in the hospital until the biopsy can be completed on Monday.

## 2015-01-07 NOTE — Progress Notes (Signed)
Pt has fallen asleep, will continue to monitor closely. AMA papers have been withheld at this time.

## 2015-01-07 NOTE — Progress Notes (Addendum)
Subjective: Overnight patient apparently wanted to leave AMA due to uncomfortable bed then later decided against that.  Today she is very concerned about her tests results especially the renal cyst.  In addition she is concerned that she will be discharged home while she still has some dizziness Objective: Vital signs in last 24 hours: Filed Vitals:   01/06/15 0743 01/06/15 1313 01/06/15 2028 01/07/15 0440  BP: 118/89 105/68 149/87 134/65  Pulse: 82 84 89 85  Temp: 98.3 F (36.8 C) 98.4 F (36.9 C) 98.4 F (36.9 C) 97.8 F (36.6 C)  TempSrc: Oral Oral Oral Oral  Resp: _0 Height:      Weight:    318 lb 8 oz (144.471 kg)  SpO2: 99% 100% 100% 96%   Weight change: -1 lb 9.6 oz (-0.726 kg)  Intake/Output Summary (Last 24 hours) at 01/07/15 0919 Last data filed at 01/07/15 0424  Gross per 24 hour  Intake      0 ml  Output    625 ml  Net   -625 ml   General: Obese, resting in bed HEENT: no scleral icterus Cardiac: RRR, no rubs, murmurs or gallops Pulm: bibasilar crackles Abd: soft, nontender, nondistended, BS present Ext: warm and well perfused, no pedal edema  Lab Results: Basic Metabolic Panel:  Recent Labs Lab 01/06/15 0410 01/07/15 0438  NA 138 139  K 3.9 3.5  CL 109 110  CO2 25 23  GLUCOSE 83 86  BUN 6 <5*  CREATININE 0.57 0.58  CALCIUM 8.5 8.9   Liver Function Tests:  Recent Labs Lab 01/06/15 0410 01/07/15 0438  AST 96* 88*  ALT 69* 70*  ALKPHOS 85 81  BILITOT 1.9* 1.6*  PROT 6.1 5.9*  ALBUMIN 3.8 3.6    Recent Labs Lab 01/06/15 1237  LIPASE 20   No results for input(s): AMMONIA in the last 168 hours. CBC:  Recent Labs Lab 01/04/15 1609  01/06/15 0410 01/07/15 0438  WBC 2.2*  < > 2.0* 2.1*  NEUTROABS 1.1*  --   --   --   HGB 10.6*  < > 9.5* 8.9*  HCT 29.9*  < > 26.6* 25.1*  MCV 110.7*  < > 109.9* 108.7*  PLT 111*  < > 100* 101*  < > = values in this interval not displayed. Hemoglobin A1C:  Recent Labs Lab  01/05/15 1040  HGBA1C 5.4   Thyroid Function Tests:  Recent Labs Lab 01/05/15 1040  TSH 2.462   Coagulation:  Recent Labs Lab 01/04/15 1609 01/05/15 1040  LABPROT 13.4 14.3  INR 1.01 1.10   Anemia Panel:  Recent Labs Lab 01/05/15 1040  VITAMINB12 347  FOLATE >20.0  FERRITIN 618*  TIBC 192*  IRON 120  RETICCTPCT 1.0   Urine Drug Screen: Drugs of Abuse     Component Value Date/Time   LABOPIA NONE DETECTED 01/04/2015 1625   COCAINSCRNUR NONE DETECTED 01/04/2015 1625   LABBENZ NONE DETECTED 01/04/2015 1625   AMPHETMU NONE DETECTED 01/04/2015 1625   THCU NONE DETECTED 01/04/2015 1625   LABBARB NONE DETECTED 01/04/2015 1625    Alcohol Level: No results for input(s): ETH in the last 168 hours. Urinalysis:  Recent Labs Lab 01/04/15 1600  COLORURINE ORANGE*  LABSPEC 1.030  PHURINE 6.0  GLUCOSEU NEGATIVE  HGBUR SMALL*  BILIRUBINUR MODERATE*  KETONESUR 15*  PROTEINUR 30*  UROBILINOGEN >8.0*  NITRITE POSITIVE*  LEUKOCYTESUR LARGE*   Misc. Labs: HIV ab>> pending  Micro Results: Recent Results (from the  past 240 hour(s))  Blood culture (routine x 2)     Status: None (Preliminary result)   Collection Time: 01/04/15  8:38 PM  Result Value Ref Range Status   Specimen Description BLOOD RIGHT HAND  Final   Special Requests BOTTLES DRAWN AEROBIC ONLY 3CC  Final   Culture   Final           BLOOD CULTURE RECEIVED NO GROWTH TO DATE CULTURE WILL BE HELD FOR 5 DAYS BEFORE ISSUING A FINAL NEGATIVE REPORT Note: Culture results may be compromised due to an inadequate volume of blood received in culture bottles. Performed at [facility]    Report Status PENDING  Incomplete  Blood culture (routine x 2)     Status: None (Preliminary result)   Collection Time: 01/04/15  8:45 PM  Result Value Ref Range Status   Specimen Description BLOOD RIGHT WRIST  Final   Special Requests [facility] EACH  Final   Culture   Final           BLOOD CULTURE RECEIVED NO GROWTH TO  DATE CULTURE WILL BE HELD FOR 5 DAYS BEFORE ISSUING A FINAL NEGATIVE REPORT Performed at [facility]    Report Status PENDING  Incomplete  MRSA PCR Screening     Status: None   Collection Time: 01/05/15 12:33 PM  Result Value Ref Range Status   MRSA by PCR NEGATIVE NEGATIVE Final    Comment:        The GeneXpert MRSA Assay (FDA approved for NASAL specimens only), is one component of a comprehensive MRSA colonization surveillance program. It is not intended to diagnose MRSA infection nor to guide or monitor treatment for MRSA infections.    Studies/Results: Ct Abdomen Pelvis W Contrast  01/06/2015   CLINICAL DATA:  48yof, abd pain, loose stool, and pre bx. Past medical hx includes; Mental disorder; Bipolar 1 disorder; Bipolar 1 disorder; Yeast infection of the vagina; UTI (lower urinary tract infection); Alcohol abuse; Shortness of breath  EXAM: CT ABDOMEN AND PELVIS WITH CONTRAST  TECHNIQUE: Multidetector CT imaging of the abdomen and pelvis was performed using the standard protocol following bolus administration of intravenous contrast.  CONTRAST:  129m OMNIPAQUE IOHEXOL 300 MG/ML  SOLN  COMPARISON:  09/13/2014  FINDINGS: 5.5 mm nodule in the posterior right lower lobe, stable. No other lung base nodules. Linear opacity at both lung bases is consistent with scarring and/or atelectasis. There is mild chronic bronchial wall thickening in the lower lobes.  Small calcifications at the dome of the liver, stable. Mild diffuse fatty infiltration of the liver. Liver otherwise unremarkable.  Spleen is mildly enlarged measuring 15.6 cm x 5.2 cm x 13 cm. It is stable. No splenic mass or focal lesion.  Status post cholecystectomy.  No bile duct dilation.  Normal pancreas.  No adrenal masses.  Stable 15 mm upper pole right renal cyst. No other renal masses or lesions. No convincing stones. No hydronephrosis. Ureters are normal course and in caliber. Bladder is decompressed but otherwise  unremarkable.  Uterus and adnexa are unremarkable.  No pathologically enlarged lymph nodes.  No ascites.  Colon and small bowel are unremarkable.  Normal appendix visualized.  There are disk degenerative changes at L5-S1. No osteoblastic or osteolytic lesions.  IMPRESSION: 1. No acute findings in the abdomen or pelvis. 2. 5.5 mm right lower lobe lung nodule. This is stable from the most recent prior study. It measures 1 mm larger and transverse dimension than it did in January 2012.  This minimal degree of change strongly supports a benign etiology 3. Hepatic steatosis similar to the most recent prior study. Stable mild splenomegaly. 4. Stable right upper pole renal cyst. Stable changes from cholecystectomy.   Electronically Signed   By: Lajean Manes M.D.   On: 01/06/2015 15:56   Medications: I have reviewed the patient's current medications. Scheduled Meds: . cefTRIAXone (ROCEPHIN)  IV  1 g Intravenous Q24H  . citalopram  20 mg Oral Daily  . heparin  5,000 Units Subcutaneous 3 times per day  . lamoTRIgine  150 mg Oral Daily  . pantoprazole  40 mg Oral Daily  . sodium chloride  3 mL Intravenous Q12H   Continuous Infusions:  PRN Meds:. Assessment/Plan: Principal Problem:   Other pancytopenia Active Problems:   Bipolar disorder   Hypotension   UTI (urinary tract infection)   Pancytopenia   Abdominal pain Pancytopenia:  -DDx includes infectious (HIV still pending), medication (Lamictal), and bone marrow (lymphoma) -Will plan on bone marrow biopsy Monday with IR, although it is feasible that she could do this as an outpatient she feels that it is unsafe for her to leave at this time, (per OT note yesterday they agree that it may be a safety risk) -If bone marrow bx is normal will d/c lamictal -HIV antibody still pending.  Abdominal pain: Patient continues to report pain but has a very benign abdominal exam.  Her CT of abd/pelvis is very reassuring, and lipase was negative. -  Monitor.  Abnormal LFTs: AST and ALT overall stable at 96 and 69 respectively. There is a elevation in bilirubin although fractionation from yesterday showing predominance of unconjugated bilirubin. Hepatitis panel negative. There is mild diffuse steatosis of liver on abdominal CT which could explain her AST and ALT elevations -Workup for malignant infiltration as above.  UTI: Completed 3 day course of ceftriaxone.  Bipolar disorder: Per chart review, she was hospitalized in January 2011. Her medications include Celexa 20 mg and Lamictal 150 mg though patient cannot confirm dose documented for either medication. -Continue home medications for now, though consider discontinuation of Lamictal pending results of bone marrow biopsy  Morbid obesity: Hemoglobin A1c of 5.4.   Dizziness: Possible BPPV, CT of head was negative, orthostatics negative, OT did attempt to preform Dix hallpike but was unsuccessful but did not a mild positive nystagmus to the left, Eply maneuver was attempted.   #FEN:  -Diet: Heart healthy  #DVT prophylaxis: Heparin  #CODE STATUS: FULL CODE -Defer to boyfriend Roseanne Reno [phone] if patients lacks decision-making capacity -Confirmed with patient on admission  Dispo: Disposition is deferred at this time, will plan on BM biopsy monday  The patient does have a current PCP (Pcp Not In System) and does not need an [facility] hospital follow-up appointment after discharge.  The patient does not have transportation limitations that hinder transportation to clinic appointments.  .Services Needed at time of discharge: Y = Yes, Blank = No PT:   OT:   RN:   Equipment:   Other:     LOS: 3 days   Lucious Groves, DO 01/07/2015, 9:19 AM

## 2015-01-07 NOTE — Progress Notes (Signed)
Patient ID: Jordan [name], female   DOB: [birth year], 49 y.o.   MRN: [id number] Medicine attending: I personally interviewed and examined this patient today together with resident physician Dr. Joni Reining and I concur with his evaluation and management plan. I had a lengthy discussion with this patient in the company of her boyfriend. She is under evaluation for unexplained pancytopenia. She has persistent complaints of dizziness, lightheadedness, and abdominal discomfort. Evaluation to date has revealed no acute findings on the CT scan of the brain, CT of the abdomen and pelvis. There is borderline splenomegaly which may be a function of her body habitus. U82, folic acid levels, are normal. 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 Due to scheduling issues this cannot be done until Monday morning. The patient has a bipolar disorder. She is in an extreme state of anxiety. Although further evaluation could be done in the outpatient setting in the average person, due to her behavioral health issues I feel it is to her benefit to keep her in the hospital until we do the bone marrow.biopsy and get a diagnosis and treatment plan.

## 2015-01-07 NOTE — Progress Notes (Signed)
IMTS progress note  Called by nursing that patient wants to go home. Came to room and had 20 min discussion with patient and boyfriend, once again going over her tests results and future plan.  She is adamant that she wants to go home and she agrees to follow up for a IR bone marrow biopsy.  I discussed with her that this would be reasonable and that she may go home but that she needs the bone marrow biopsy.  Will discharge patient today.  She will follow up with IR radiology will try to facilitate this on Monday.  Lucious Groves, DO

## 2015-01-07 NOTE — Progress Notes (Signed)
OT Cancellation Note  Patient Details Name: Jordan [name] MRN: [id number] DOB: 1965-11-09   Cancelled Treatment:    Reason Eval/Treat Not Completed: Patient declined, no reason specified. OT presented this morning and pt was perseverative on new finding of "bubble" on her kidney and is concerned that MD does not plan to surgically remove or monitor. Pt also perseverative on financial issues and medicaid payment for hospital stay and treatments. OT politely interrupted pt's monologue and deferred financial questions to CSW/CM and explained to pt that she came to attempt the Epley maneuver to address dizziness at which point pt stated "I don't want to do it." OT again explained maneuver and how it may help with dizziness and pt again stated that she did not want to do it and does not want OT. She reports frustration that "the doctors are telling me to get up but they are not seeing how I do when I move around" and OT explained that the doctors defer to therapy sessions to determine pt safety with mobility. Pt with anxiety and again stated she did not want to work with OT. Acute OT to sign off at this time. OT can be re-ordered in the future if MD feels that pt will benefit. Thank you for this referral.   Su Ley, OTR/L Occupational Therapist [phone] (pager)  01/07/2015, 10:57 AM

## 2015-01-07 NOTE — Progress Notes (Signed)
Pt continue to request to leave AMA stating "the bed is too uncomfortable", "I don't care if I have anymore tests", "I'm stubborn and I will not fall". Pt states she will take cab home. Pt states" she does not care if she has the biopsy done". Pt advised that leaving the hospital would be against medical advice that was given by MD Posey Pronto and associates at 9pm.

## 2015-01-07 NOTE — Discharge Instructions (Signed)
We have found that you blood counts are low.  We need to get a bone marrow biopsy to find out why.  You will need to come back to the hospital for this procedure.

## 2015-01-09 ENCOUNTER — Other Ambulatory Visit: Payer: Self-pay | Admitting: Internal Medicine

## 2015-01-09 DIAGNOSIS — D61818 Other pancytopenia: Secondary | ICD-10-CM

## 2015-01-09 LAB — HIV ANTIBODY (ROUTINE TESTING W REFLEX)

## 2015-01-11 LAB — CULTURE, BLOOD (ROUTINE X 2)
Culture: NO GROWTH
Culture: NO GROWTH

## 2015-01-11 NOTE — Progress Notes (Signed)
Addendum to previous discharge note from the following hospitalization:   Name: Maddix Kliewer MRN: [id number] DOB: [birth year] 49 y.o. PCP: Dr. Alphonzo Grieve  Date of Admission: 01/04/2015 3:49 PM Date of Discharge: 01/07/2015 Attending Physician: Dr. Madilyn Fireman  An HIV test was performed as one was not in the system and this specimen was lost by the lab.

## 2015-01-23 ENCOUNTER — Other Ambulatory Visit: Payer: Self-pay | Admitting: Radiology

## 2015-01-24 ENCOUNTER — Encounter (HOSPITAL_COMMUNITY): Payer: Self-pay

## 2015-01-24 ENCOUNTER — Ambulatory Visit (HOSPITAL_COMMUNITY)
Admission: RE | Admit: 2015-01-24 | Discharge: 2015-01-24 | Disposition: A | Discharge status: Home / Self Care | Payer: Medicaid Other | Source: Ambulatory Visit | Attending: Internal Medicine | Admitting: Internal Medicine

## 2015-01-24 DIAGNOSIS — E669 Obesity, unspecified: Secondary | ICD-10-CM | POA: Insufficient documentation

## 2015-01-24 DIAGNOSIS — Z5309 Procedure and treatment not carried out because of other contraindication: Secondary | ICD-10-CM | POA: Diagnosis not present

## 2015-01-24 DIAGNOSIS — F319 Bipolar disorder, unspecified: Secondary | ICD-10-CM | POA: Diagnosis not present

## 2015-01-24 DIAGNOSIS — D61818 Other pancytopenia: Secondary | ICD-10-CM

## 2015-01-24 DIAGNOSIS — Z9049 Acquired absence of other specified parts of digestive tract: Secondary | ICD-10-CM | POA: Insufficient documentation

## 2015-01-24 DIAGNOSIS — Z79899 Other long term (current) drug therapy: Secondary | ICD-10-CM | POA: Insufficient documentation

## 2015-01-24 LAB — PROTIME-INR
INR: 1.04 (ref 0.00–1.49)
Prothrombin Time: 13.8 seconds (ref 11.6–15.2)

## 2015-01-24 LAB — CBC
HCT: 30.7 % — ABNORMAL LOW (ref 36.0–46.0)
Hemoglobin: 10.1 g/dL — ABNORMAL LOW (ref 12.0–15.0)
MCH: 35.8 pg — ABNORMAL HIGH (ref 26.0–34.0)
MCHC: 32.9 g/dL (ref 30.0–36.0)
MCV: 108.9 fL — ABNORMAL HIGH (ref 78.0–100.0)
Platelets: 162 10*3/uL (ref 150–400)
RBC: 2.82 MIL/uL — ABNORMAL LOW (ref 3.87–5.11)
RDW: 21.3 % — ABNORMAL HIGH (ref 11.5–15.5)
WBC: 4.9 10*3/uL (ref 4.0–10.5)

## 2015-01-24 LAB — APTT: aPTT: 33 seconds (ref 24–37)

## 2015-01-24 MED ORDER — FENTANYL CITRATE 0.05 MG/ML IJ SOLN
INTRAMUSCULAR | Status: AC
  Filled 2015-01-24: qty 4

## 2015-01-24 MED ORDER — FENTANYL CITRATE 0.05 MG/ML IJ SOLN
50.0000 ug | Freq: Once | INTRAMUSCULAR | Status: AC
  Administered 2015-01-24: 50 ug via INTRAVENOUS

## 2015-01-24 MED ORDER — MIDAZOLAM HCL 2 MG/2ML IJ SOLN
1.0000 mg | Freq: Once | INTRAMUSCULAR | Status: AC
  Administered 2015-01-24: 1 mg via INTRAVENOUS

## 2015-01-24 MED ORDER — MIDAZOLAM HCL 2 MG/2ML IJ SOLN
INTRAMUSCULAR | Status: AC
  Filled 2015-01-24: qty 6

## 2015-01-24 MED ORDER — SODIUM CHLORIDE 0.9 % IV SOLN
INTRAVENOUS | Status: DC
  Administered 2015-01-24: 08:00:00 via INTRAVENOUS

## 2015-01-24 NOTE — H&P (Signed)
Chief Complaint: "I'm having a bone marrow biopsy"  Referring Physician(s): Ngo,Lawrence  History of Present Illness: Jordan [name] is a 49 y.o. female with history of unexplained pancytopenia who presents today for CT guided bone marrow biopsy for further evaluation.   Past Medical History  Diagnosis Date  . Mental disorder   . Bipolar 1 disorder   . Bipolar 1 disorder   . Yeast infection of the vagina   . Bipolar disorder   . Obesity   . UTI (lower urinary tract infection)   . Alcohol abuse   . Shortness of breath dyspnea     Past Surgical History  Procedure Laterality Date  . Cholecystectomy      Allergies: Tramadol and Sulfa antibiotics  Medications: Prior to Admission medications   Medication Sig Start Date End Date Taking? Authorizing Provider  citalopram (CELEXA) 20 MG tablet Take 20 mg by mouth every morning.    Yes Historical Provider, MD  estrogens, conjugated, (PREMARIN) 0.625 MG tablet Take 0.625 mg by mouth daily. Take daily for 21 days then do not take for 7 days.   Yes Historical Provider, MD  gemfibrozil (LOPID) 600 MG tablet Take 600 mg by mouth 2 (two) times daily before a meal.   Yes Historical Provider, MD  lamoTRIgine (LAMICTAL) 100 MG tablet Take 100 mg by mouth every morning. Pt takes a total of 150 mg   Yes Historical Provider, MD  lamoTRIgine (LAMICTAL) 25 MG tablet Take 50 mg by mouth every morning. Pt takes a total of 150 mg   Yes Historical Provider, MD  ibuprofen (ADVIL,MOTRIN) 800 MG tablet Take 800 mg by mouth every 8 (eight) hours as needed for moderate pain.    Historical Provider, MD    History reviewed. No pertinent family history.  History   Social History  . Marital Status: Single    Spouse Name: N/A  . Number of Children: N/A  . Years of Education: N/A   Social History Main Topics  . Smoking status: Never Smoker   . Smokeless tobacco: Never Used  . Alcohol Use: No  . Drug Use: No  . Sexual Activity: Yes    Birth  Control/ Protection: None   Other Topics Concern  . None   Social History Narrative     Review of Systems  Constitutional: Positive for appetite change, fatigue and unexpected weight change. Negative for fever and chills.  Respiratory: Negative for cough and shortness of breath.   Cardiovascular: Negative for chest pain.  Gastrointestinal: Positive for abdominal pain. Negative for nausea, vomiting and blood in stool.  Genitourinary: Negative for dysuria and hematuria.  Musculoskeletal: Negative for back pain.  Neurological: Negative for headaches.  Hematological: Does not bruise/bleed easily.  Psychiatric/Behavioral: The patient is nervous/anxious.     Vital Signs: BP 141/83 mmHg  Pulse 101  Temp(Src) 98.5 F (36.9 C) (Oral)  Resp 18  SpO2 99%  LMP 09/13/2014  Physical Exam  Constitutional: She is oriented to person, place, and time. She appears well-developed and well-nourished.  Cardiovascular: Regular rhythm.   Sl tachy  Pulmonary/Chest: Effort normal.  Few bibasilar crackles  Abdominal: Soft. Bowel sounds are normal. There is tenderness.  Morbidly obese  Musculoskeletal: Normal range of motion.  Neurological: She is alert and oriented to person, place, and time.    Imaging: Dg Chest 2 View  01/04/2015   CLINICAL DATA:  Chest pain and dyspnea  EXAM: CHEST  2 VIEW  COMPARISON:  08/18/2014  FINDINGS: The heart  size and mediastinal contours are within normal limits. Both lungs are clear. The visualized skeletal structures are unremarkable.  IMPRESSION: No active cardiopulmonary disease.   Electronically Signed   By: Andreas Newport M.D.   On: 01/04/2015 22:37   Ct Head Wo Contrast  01/05/2015   CLINICAL DATA:  Dizziness and increased weakness for 3 months. History of bipolar disorder, dizziness.  EXAM: CT HEAD WITHOUT CONTRAST  TECHNIQUE: Contiguous axial images were obtained from the base of the skull through the vertex without intravenous contrast.  COMPARISON:   None.  FINDINGS: The ventricles and sulci are normal. No intraparenchymal hemorrhage, mass effect nor midline shift. No acute large vascular territory infarcts.  No abnormal extra-axial fluid collections. Basal cisterns are patent. Mild calcific atherosclerosis of the carotid siphon.  No skull fracture. The included ocular globes and orbital contents are non-suspicious. Mild paranasal sinus mucosal thickening without air-fluid levels. Trace RIGHT mastoid effusion. Absent maxillary teeth.  IMPRESSION: No acute intracranial process; mild calcific atherosclerosis.   Electronically Signed   By: Elon Alas   On: 01/05/2015 02:58   Ct Abdomen Pelvis W Contrast  01/06/2015   CLINICAL DATA:  48yof, abd pain, loose stool, and pre bx. Past medical hx includes; Mental disorder; Bipolar 1 disorder; Bipolar 1 disorder; Yeast infection of the vagina; UTI (lower urinary tract infection); Alcohol abuse; Shortness of breath  EXAM: CT ABDOMEN AND PELVIS WITH CONTRAST  TECHNIQUE: Multidetector CT imaging of the abdomen and pelvis was performed using the standard protocol following bolus administration of intravenous contrast.  CONTRAST:  157m OMNIPAQUE IOHEXOL 300 MG/ML  SOLN  COMPARISON:  09/13/2014  FINDINGS: 5.5 mm nodule in the posterior right lower lobe, stable. No other lung base nodules. Linear opacity at both lung bases is consistent with scarring and/or atelectasis. There is mild chronic bronchial wall thickening in the lower lobes.  Small calcifications at the dome of the liver, stable. Mild diffuse fatty infiltration of the liver. Liver otherwise unremarkable.  Spleen is mildly enlarged measuring 15.6 cm x 5.2 cm x 13 cm. It is stable. No splenic mass or focal lesion.  Status post cholecystectomy.  No bile duct dilation.  Normal pancreas.  No adrenal masses.  Stable 15 mm upper pole right renal cyst. No other renal masses or lesions. No convincing stones. No hydronephrosis. Ureters are normal course and in  caliber. Bladder is decompressed but otherwise unremarkable.  Uterus and adnexa are unremarkable.  No pathologically enlarged lymph nodes.  No ascites.  Colon and small bowel are unremarkable.  Normal appendix visualized.  There are disk degenerative changes at L5-S1. No osteoblastic or osteolytic lesions.  IMPRESSION: 1. No acute findings in the abdomen or pelvis. 2. 5.5 mm right lower lobe lung nodule. This is stable from the most recent prior study. It measures 1 mm larger and transverse dimension than it did in January 2012. This minimal degree of change strongly supports a benign etiology 3. Hepatic steatosis similar to the most recent prior study. Stable mild splenomegaly. 4. Stable right upper pole renal cyst. Stable changes from cholecystectomy.   Electronically Signed   By: DLajean ManesM.D.   On: 01/06/2015 15:56    Labs:  CBC:  Recent Labs  01/04/15 1609 01/05/15 1040 01/06/15 0410 01/07/15 0438  WBC 2.2* 1.9* 2.0* 2.1*  HGB 10.6* 8.7* 9.5* 8.9*  HCT 29.9* 24.7* 26.6* 25.1*  PLT 111* 106* 100* 101*    COAGS:  Recent Labs  01/04/15 1609 01/05/15 1040  INR 1.01  1.10    BMP:  Recent Labs  01/04/15 1609 01/05/15 1306 01/06/15 0410 01/07/15 0438  NA 138 139 138 139  K 3.6 3.7 3.9 3.5  CL 105 110 109 110  CO2 _0 GLUCOSE 95 93 83 86  BUN _1 <5*  CALCIUM 9.2 8.4 8.5 8.9  CREATININE 0.76 0.59 0.57 0.58  GFRNONAA >90 >90 >90 >90  GFRAA >90 >90 >90 >90    LIVER FUNCTION TESTS:  Recent Labs  01/04/15 1609 01/05/15 1040 01/05/15 1306 01/06/15 0410 01/07/15 0438  BILITOT 2.3* 1.8* 1.9* 1.9* 1.6*  AST 85*  --  70* 96* 88*  ALT 69*  --  56* 69* 70*  ALKPHOS 89  --  71 85 81  PROT 6.4  --  5.5* 6.1 5.9*  ALBUMIN 4.0  --  3.4* 3.8 3.6    TUMOR MARKERS: No results for input(s): AFPTM, CEA, CA199, CHROMGRNA in the last 8760 hours.  Assessment and Plan: Jordan [name] is a 49 y.o. female with history of unexplained pancytopenia who presents  today for CT guided bone marrow biopsy for further evaluation.Risks and benefits discussed with the patient/boyfriend including, but not limited to bleeding, infection, damage to adjacent structures or low yield requiring additional tests. All of the patient's questions were answered, patient is agreeable to proceed. Consent signed and in chart.    Signed: Autumn Messing 01/24/2015, 8:24 AM   I spent a total of 20 minutes face to face in clinical consultation, greater than 50% of which was counseling/coordinating care for CT guided bone marrow biopsy

## 2015-01-24 NOTE — Progress Notes (Signed)
Patient aware of plan to be d/c home now and office will call to inform patient of future plans. Patient stated best number to call her is [phone]. Home with boyfriend.

## 2015-01-24 NOTE — Progress Notes (Signed)
Patient has been d/c per IR MD. Patient and boyfriend awaiting transportation home. RN has assisted patient to restroom and back. Has had a drink and snack. Aware of conscious sedation d/c instructions. Will be d/c to home as soon as ride arrives.

## 2015-01-24 NOTE — Procedures (Signed)
Pt unable to lie prone or lateral decubitus on CT scanner table. She refuses to proceed with the Bm bx  Can consider rescheduling with anesthesia if medically necessary  Unable to reach referring docs this morning.  Will d/c pt and keeping trying to contact ordering staff

## 2015-01-24 NOTE — Discharge Instructions (Signed)
Conscious Sedation, Adult, Care After °Refer to this sheet in the next few weeks. These instructions provide you with information on caring for yourself after your procedure. Your health care provider may also give you more specific instructions. Your treatment has been planned according to current medical practices, but problems sometimes occur. Call your health care provider if you have any problems or questions after your procedure. °WHAT TO EXPECT AFTER THE PROCEDURE  °After your procedure: °· You may feel sleepy, clumsy, and have poor balance for several hours. °· Vomiting may occur if you eat too soon after the procedure. °HOME CARE INSTRUCTIONS °· Do not participate in any activities where you could become injured for at least 24 hours. Do not: °¨ Drive. °¨ Swim. °¨ Ride a bicycle. °¨ Operate heavy machinery. °¨ Cook. °¨ Use power tools. °¨ Climb ladders. °¨ Work from a high place. °· Do not make important decisions or sign legal documents until you are improved. °· If you vomit, drink water, juice, or soup when you can drink without vomiting. Make sure you have little or no nausea before eating solid foods. °· Only take over-the-counter or prescription medicines for pain, discomfort, or fever as directed by your health care provider. °· Make sure you and your family fully understand everything about the medicines given to you, including what side effects may occur. °· You should not drink alcohol, take sleeping pills, or take medicines that cause drowsiness for at least 24 hours. °· If you smoke, do not smoke without supervision. °· If you are feeling better, you may resume normal activities 24 hours after you were sedated. °· Keep all appointments with your health care provider. °SEEK MEDICAL CARE IF: °· Your skin is pale or bluish in color. °· You continue to feel nauseous or vomit. °· Your pain is getting worse and is not helped by medicine. °· You have bleeding or swelling. °· You are still sleepy or  feeling clumsy after 24 hours. °SEEK IMMEDIATE MEDICAL CARE IF: °· You develop a rash. °· You have difficulty breathing. °· You develop any type of allergic problem. °· You have a fever. °MAKE SURE YOU: °· Understand these instructions. °· Will watch your condition. °· Will get help right away if you are not doing well or get worse. °Document Released: 07/28/2013 Document Reviewed: 07/28/2013 °[employer]® Patient Information ©2015 [employer]. This information is not intended to replace advice given to you by your health care provider. Make sure you discuss any questions you have with your health care provider. °  °

## 2015-01-25 ENCOUNTER — Other Ambulatory Visit: Payer: Self-pay | Admitting: Radiology

## 2015-01-25 NOTE — Progress Notes (Signed)
Unable to reach pt by phone. Left pre-op instructions on voicemail. Instructed pt to be NPO after MN tonight, instructed her to take Celexa and Lamictal with small sip of water, instructed pt to arrive at 8:30 AM to the [facility]. Also informed her that she would need a responsible adult with her for 24 hours after the procedure and that she wasn't allowed to drive, drink alcohol, operate machinery and sign legal papers.

## 2015-01-26 ENCOUNTER — Encounter (HOSPITAL_COMMUNITY): Payer: Self-pay | Admitting: Anesthesiology

## 2015-01-26 ENCOUNTER — Inpatient Hospital Stay (HOSPITAL_COMMUNITY)
Admission: RE | Admit: 2015-01-26 | Payer: Medicaid Other | Source: Ambulatory Visit | Admitting: Interventional Radiology

## 2015-01-26 ENCOUNTER — Encounter (HOSPITAL_COMMUNITY): Admission: RE | Payer: Self-pay | Source: Ambulatory Visit

## 2015-01-26 ENCOUNTER — Ambulatory Visit (HOSPITAL_COMMUNITY): Admission: RE | Admit: 2015-01-26 | Payer: Medicaid Other | Source: Ambulatory Visit

## 2015-01-26 SURGERY — RADIOLOGY WITH ANESTHESIA
Anesthesia: General

## 2015-01-31 ENCOUNTER — Emergency Department (HOSPITAL_COMMUNITY): Payer: Medicaid Other

## 2015-01-31 ENCOUNTER — Inpatient Hospital Stay (HOSPITAL_COMMUNITY)
Admission: EM | Admit: 2015-01-31 | Discharge: 2015-01-31 | DRG: 690 | Disposition: A | Discharge status: Home / Self Care | Payer: Medicaid Other | Attending: Internal Medicine | Admitting: Internal Medicine

## 2015-01-31 ENCOUNTER — Encounter (HOSPITAL_COMMUNITY): Payer: Self-pay | Admitting: Emergency Medicine

## 2015-01-31 DIAGNOSIS — D61818 Other pancytopenia: Secondary | ICD-10-CM | POA: Diagnosis present

## 2015-01-31 DIAGNOSIS — N390 Urinary tract infection, site not specified: Secondary | ICD-10-CM | POA: Diagnosis not present

## 2015-01-31 DIAGNOSIS — Z79899 Other long term (current) drug therapy: Secondary | ICD-10-CM

## 2015-01-31 DIAGNOSIS — R531 Weakness: Secondary | ICD-10-CM | POA: Diagnosis present

## 2015-01-31 DIAGNOSIS — F319 Bipolar disorder, unspecified: Secondary | ICD-10-CM | POA: Diagnosis present

## 2015-01-31 DIAGNOSIS — R109 Unspecified abdominal pain: Secondary | ICD-10-CM

## 2015-01-31 DIAGNOSIS — B9620 Unspecified Escherichia coli [E. coli] as the cause of diseases classified elsewhere: Secondary | ICD-10-CM | POA: Diagnosis present

## 2015-01-31 DIAGNOSIS — G8929 Other chronic pain: Secondary | ICD-10-CM

## 2015-01-31 DIAGNOSIS — R5383 Other fatigue: Secondary | ICD-10-CM

## 2015-01-31 HISTORY — DX: Anemia, unspecified: D64.9

## 2015-01-31 LAB — CBC WITH DIFFERENTIAL/PLATELET
Basophils Absolute: 0 10*3/uL (ref 0.0–0.1)
Basophils Relative: 0 % (ref 0–1)
Eosinophils Absolute: 0 10*3/uL (ref 0.0–0.7)
Eosinophils Relative: 0 % (ref 0–5)
HCT: 31.6 % — ABNORMAL LOW (ref 36.0–46.0)
Hemoglobin: 10.5 g/dL — ABNORMAL LOW (ref 12.0–15.0)
Lymphocytes Relative: 24 % (ref 12–46)
Lymphs Abs: 0.9 10*3/uL (ref 0.7–4.0)
MCH: 35.6 pg — ABNORMAL HIGH (ref 26.0–34.0)
MCHC: 33.2 g/dL (ref 30.0–36.0)
MCV: 107.1 fL — ABNORMAL HIGH (ref 78.0–100.0)
Monocytes Absolute: 0.1 10*3/uL (ref 0.1–1.0)
Monocytes Relative: 3 % (ref 3–12)
Neutro Abs: 2.7 10*3/uL (ref 1.7–7.7)
Neutrophils Relative %: 73 % (ref 43–77)
Platelets: 116 10*3/uL — ABNORMAL LOW (ref 150–400)
RBC: 2.95 MIL/uL — ABNORMAL LOW (ref 3.87–5.11)
RDW: 23.4 % — ABNORMAL HIGH (ref 11.5–15.5)
WBC: 3.7 10*3/uL — ABNORMAL LOW (ref 4.0–10.5)

## 2015-01-31 LAB — COMPREHENSIVE METABOLIC PANEL
ALT: 51 U/L — ABNORMAL HIGH (ref 0–35)
AST: 66 U/L — ABNORMAL HIGH (ref 0–37)
Albumin: 4.1 g/dL (ref 3.5–5.2)
Alkaline Phosphatase: 86 U/L (ref 39–117)
Anion gap: 9 (ref 5–15)
BUN: 14 mg/dL (ref 6–23)
CO2: 24 mmol/L (ref 19–32)
Calcium: 8.8 mg/dL (ref 8.4–10.5)
Chloride: 110 mmol/L (ref 96–112)
Creatinine, Ser: 0.62 mg/dL (ref 0.50–1.10)
GFR calc Af Amer: >90 mL/min (ref >90)
GFR calc non Af Amer: >90 mL/min (ref >90)
Glucose, Bld: 90 mg/dL (ref 70–99)
Potassium: 4.1 mmol/L (ref 3.5–5.1)
Sodium: 143 mmol/L (ref 135–145)
Total Bilirubin: 3.3 mg/dL — ABNORMAL HIGH (ref 0.3–1.2)
Total Protein: 6.8 g/dL (ref 6.0–8.3)

## 2015-01-31 LAB — RAPID HIV SCREEN (HIV 1/2 AB+AG)
HIV 1/2 Antibodies: NONREACTIVE
HIV-1 P24 Antigen - HIV24: NONREACTIVE

## 2015-01-31 LAB — URINALYSIS, ROUTINE W REFLEX MICROSCOPIC
Glucose, UA: NEGATIVE mg/dL
Ketones, ur: NEGATIVE mg/dL
Nitrite: POSITIVE — AB
Protein, ur: NEGATIVE mg/dL
Specific Gravity, Urine: 1.021 (ref 1.005–1.030)
Urobilinogen, UA: >8 mg/dL — ABNORMAL HIGH (ref 0.0–1.0)
pH: 6 (ref 5.0–8.0)

## 2015-01-31 LAB — PREGNANCY, URINE: Preg Test, Ur: NEGATIVE

## 2015-01-31 LAB — URINE MICROSCOPIC-ADD ON

## 2015-01-31 MED ORDER — DEXTROSE 5 % IV SOLN
1.0000 g | Freq: Once | INTRAVENOUS | Status: AC
  Administered 2015-01-31: 1 g via INTRAVENOUS
  Filled 2015-01-31: qty 10

## 2015-01-31 MED ORDER — LEVOFLOXACIN IN D5W 750 MG/150ML IV SOLN
750.0000 mg | Freq: Once | INTRAVENOUS | Status: AC
  Administered 2015-01-31: 750 mg via INTRAVENOUS
  Filled 2015-01-31: qty 150

## 2015-01-31 NOTE — ED Notes (Signed)
Patient transported to X-ray 

## 2015-01-31 NOTE — ED Notes (Signed)
PA and nurse in room working with patient.  I will collect labs when they finish.

## 2015-01-31 NOTE — H&P (Signed)
[facility] History and Physical  [name] VEH:[id number] DOB: 1966-04-03 DOA: 01/31/2015  Referring physician: Abigail Butts, PA PCP: Pcp Not In System   Chief Complaint: UTI  HPI: Jordan [name] is a 49 y.o. female presents from her PCP for evalaution. She states taht she has been sick for the last 7 months with dizziness and has been weak. Apparently she was supposed to have a workup done for pancytopenia several times with a bone marrow biopsy done which she did not get done. She states that she was afraid of getting it done. Patient was attempted to be contacted but she did not get the procedure done. Now she presents with a UTI. She states that she has a strong odor no burning noted. She states that she cannot ambulate. She has weakness and is not even able to get off the commode without help. She states that she need PT and OT evaluation. She states that her regular doctor told her she might have leukemia. She states that she also has bipolar disorder and this does not help her.    Review of Systems:  Patient complete 12 point ROS done and is unremarkable other than what is noted in HPI  Past Medical History  Diagnosis Date  . Mental disorder   . Bipolar 1 disorder   . Bipolar 1 disorder   . Yeast infection of the vagina   . Bipolar disorder   . Obesity   . UTI (lower urinary tract infection)   . Alcohol abuse   . Shortness of breath dyspnea   . Anemia    Past Surgical History  Procedure Laterality Date  . Cholecystectomy     Social History:  reports that she has never smoked. She has never used smokeless tobacco. She reports that she does not drink alcohol or use illicit drugs.  Allergies  Allergen Reactions  . Tramadol Anaphylaxis, Shortness Of Breath and Swelling  . Sulfa Antibiotics Swelling    History reviewed. No pertinent family history.   Prior to Admission medications   Medication Sig Start Date End Date Taking? Authorizing Provider    citalopram (CELEXA) 20 MG tablet Take 20 mg by mouth every morning.    Yes Historical Provider, MD  estrogens, conjugated, (PREMARIN) 0.625 MG tablet Take 0.625 mg by mouth daily. Take daily for 21 days then do not take for 7 days.   Yes Historical Provider, MD  gemfibrozil (LOPID) 600 MG tablet Take 600 mg by mouth 2 (two) times daily before a meal.   Yes Historical Provider, MD  lamoTRIgine (LAMICTAL) 100 MG tablet Take 100 mg by mouth every morning. Pt takes a total of 150 mg   Yes Historical Provider, MD  lamoTRIgine (LAMICTAL) 25 MG tablet Take 50 mg by mouth every morning. Pt takes a total of 150 mg   Yes Historical Provider, MD  ibuprofen (ADVIL,MOTRIN) 800 MG tablet Take 800 mg by mouth every 8 (eight) hours as needed for moderate pain.    Historical Provider, MD   Physical Exam: Filed Vitals:   01/31/15 1550 01/31/15 1645 01/31/15 1800 01/31/15 1807  BP: 130/89   104/63  Pulse: 100 91 84 94  Temp: 99.6 F (37.6 C)     TempSrc: Oral     Resp: 20 20 20 16   SpO2: 94% 97% 95% 97%    Wt Readings from Last 3 Encounters:  01/07/15 144.471 kg (318 lb 8 oz)  10/22/14 145.151 kg (320 lb)  08/18/14 160.573 kg (354 lb)  General:  Appears anxious but comfortable Eyes: PERRL, normal lids, irises & conjunctiva ENT: grossly normal hearing, lips & tongue Neck: no LAD, masses or thyromegaly Cardiovascular: RRR, no m/r/g. No LE edema. Respiratory: CTA bilaterally, no w/r/r. Normal respiratory effort. Abdomen: soft, ntnd Skin: no rash or induration seen on limited exam Musculoskeletal: grossly normal tone BUE/BLE Psychiatric: affect is somewhat inappropriate Neurologic: grossly non-focal.          Labs on Admission:  Basic Metabolic Panel:  Recent Labs Lab 01/31/15 1700  NA 143  K 4.1  CL 110  CO2 24  GLUCOSE 90  BUN 14  CREATININE 0.62  CALCIUM 8.8   Liver Function Tests:  Recent Labs Lab 01/31/15 1700  AST 66*  ALT 51*  ALKPHOS 86  BILITOT 3.3*  PROT 6.8   ALBUMIN 4.1   No results for input(s): LIPASE, AMYLASE in the last 168 hours. No results for input(s): AMMONIA in the last 168 hours. CBC:  Recent Labs Lab 01/31/15 1700  WBC 3.7*  NEUTROABS 2.7  HGB 10.5*  HCT 31.6*  MCV 107.1*  PLT 116*   Cardiac Enzymes: No results for input(s): CKTOTAL, CKMB, CKMBINDEX, TROPONINI in the last 168 hours.  BNP (last 3 results) No results for input(s): BNP in the last 8760 hours.  ProBNP (last 3 results)  Recent Labs  05/10/14 1047  PROBNP 173.6*    CBG: No results for input(s): GLUCAP in the last 168 hours.  Radiological Exams on Admission: Dg Chest 2 View  01/31/2015   CLINICAL DATA:  Leukemia and fatigue.  EXAM: CHEST  2 VIEW  COMPARISON:  January 04, 2015  FINDINGS: There is no edema or consolidation. Heart size and pulmonary vascularity are normal. No adenopathy. No bone lesions.  IMPRESSION: No edema or consolidation.   Electronically Signed   By: Lowella Grip III M.D.   On: 01/31/2015 16:38      Assessment/Plan Active Problems:   UTI (lower urinary tract infection)   Pancytopenia   1. UTI -has had recurring episodes -will send urine cultures -will start on IV levaquin -hydrate as tolerated  2. Pancytopenia -has basically refused workup as an outpatient now is willing to get the workup done -remote history of ETOH abuse states that she does not drink presently -will need BM Biopsy and hematology consult  3. Abnormal LFTs -will get abdominal ultraosund -monitor labs  4. Elevated MCV -will get hematology evaluation  5. Bipolar Disorder -continue with home medications   Code Status: Full Code (must indicate code status--if unknown or must be presumed, indicate so) DVT Prophylaxis:TEDS Family Communication: Boyfriend (indicate person spoken with, if applicable, with phone number if by telephone) Disposition Plan: Home (indicate anticipated LOS)  Time spent: 49mn  KHAN,SAADAT A [facility] Pager [phone]

## 2015-01-31 NOTE — ED Provider Notes (Signed)
CSN: [id number]     Arrival date & time 01/31/15  1548 History   First MD Initiated Contact with Patient 01/31/15 1604     Chief Complaint  Patient presents with  . Fatigue     (Consider location/radiation/quality/duration/timing/severity/associated sxs/prior Treatment) The history is provided by the patient and medical records. No language interpreter was used.     Jordan [name] is a 49 y.o. female  with a hx of Bipolar disorder, anxiety, obesity, pancytopenia peninf leukemia/lymphoma work-up presents to the Emergency Department from her PCP office complaining of gradual, persistent, progressively worsening generalized weakness onset 7 mos ago.  Pt reports that she is too weak to walk and requires assistance with toileting, dressing and mobility.   Associated symptoms include fatigue, decreased appetite and DOE.  Pt denies fever, chills, headache, neck pain, chest pain, syncope, dysuria. No aggravating or alleviating factors.  Pt also c/o generalized abd pain for which she has had multiple work-ups including a CT scan on 01/06/15 without acute findings.    Record review shows that pt was admitted in March 2016 for UTI and hypotension. At that time her pancytopenia was a concern, but she left AMA prior to CT guided bone marrow biopsy.  Pt was then scheduled for the outpatient procedure on 01/26/15, but was unable/unwilling to lay flat for the procedure.     Past Medical History  Diagnosis Date  . Mental disorder   . Bipolar 1 disorder   . Bipolar 1 disorder   . Yeast infection of the vagina   . Bipolar disorder   . Obesity   . UTI (lower urinary tract infection)   . Alcohol abuse   . Shortness of breath dyspnea   . Anemia    Past Surgical History  Procedure Laterality Date  . Cholecystectomy     History reviewed. No pertinent family history. History  Substance Use Topics  . Smoking status: Never Smoker   . Smokeless tobacco: Never Used  . Alcohol Use: No   OB History    Gravida Para Term Preterm AB TAB SAB Ectopic Multiple Living   1 1 1       1      Review of Systems  Constitutional: Negative for fever, diaphoresis, appetite change, fatigue and unexpected weight change.  HENT: Negative for mouth sores.   Eyes: Negative for visual disturbance.  Respiratory: Negative for cough, chest tightness, shortness of breath and wheezing.   Cardiovascular: Negative for chest pain.  Gastrointestinal: Negative for nausea, vomiting, abdominal pain, diarrhea and constipation.  Endocrine: Negative for polydipsia, polyphagia and polyuria.  Genitourinary: Negative for dysuria, urgency, frequency and hematuria.  Musculoskeletal: Negative for back pain and neck stiffness.  Skin: Negative for rash.  Allergic/Immunologic: Negative for immunocompromised state.  Neurological: Positive for weakness. Negative for syncope, light-headedness and headaches.  Hematological: Does not bruise/bleed easily.  Psychiatric/Behavioral: Negative for sleep disturbance. The patient is not nervous/anxious.       Allergies  Tramadol and Sulfa antibiotics  Home Medications   Prior to Admission medications   Medication Sig Start Date End Date Taking? Authorizing Provider  citalopram (CELEXA) 20 MG tablet Take 20 mg by mouth every morning.    Yes Historical Provider, MD  estrogens, conjugated, (PREMARIN) 0.625 MG tablet Take 0.625 mg by mouth daily. Take daily for 21 days then do not take for 7 days.   Yes Historical Provider, MD  gemfibrozil (LOPID) 600 MG tablet Take 600 mg by mouth 2 (two) times daily before a meal.  Yes Historical Provider, MD  lamoTRIgine (LAMICTAL) 100 MG tablet Take 100 mg by mouth every morning. Pt takes a total of 150 mg   Yes Historical Provider, MD  lamoTRIgine (LAMICTAL) 25 MG tablet Take 50 mg by mouth every morning. Pt takes a total of 150 mg   Yes Historical Provider, MD  ibuprofen (ADVIL,MOTRIN) 800 MG tablet Take 800 mg by mouth every 8 (eight) hours as needed  for moderate pain.    Historical Provider, MD   BP 104/63 mmHg  Pulse 94  Temp(Src) 99.6 F (37.6 C) (Oral)  Resp 16  SpO2 97%  LMP 09/13/2014 Physical Exam  Constitutional: She appears well-developed and well-nourished. No distress.  Awake, alert, nontoxic appearance Obese  HENT:  Head: Normocephalic and atraumatic.  Mouth/Throat: Oropharynx is clear and moist. No oropharyngeal exudate.  Eyes: Conjunctivae are normal. No scleral icterus.  Neck: Normal range of motion. Neck supple.  Cardiovascular: Regular rhythm, normal heart sounds and intact distal pulses.  Tachycardia present.   Pulmonary/Chest: Effort normal and breath sounds normal. No respiratory distress. She has no wheezes.  Equal chest expansion  Abdominal: Soft. Bowel sounds are normal. She exhibits no mass. There is no tenderness. There is no rebound and no guarding.  Soft and nontender Obese No CVA tenderness Small area of ecchymosis noted to the abd  Musculoskeletal: Normal range of motion. She exhibits no edema.  Neurological: She is alert.  Speech is clear and goal oriented Moves extremities without ataxia  Skin: Skin is warm and dry. No rash noted. She is not diaphoretic. No erythema.  Psychiatric: She has a normal mood and affect.  Nursing note and vitals reviewed.   ED Course  Procedures (including critical care time) Labs Review Labs Reviewed  CBC WITH DIFFERENTIAL/PLATELET - Abnormal; Notable for the following:    WBC 3.7 (*)    RBC 2.95 (*)    Hemoglobin 10.5 (*)    HCT 31.6 (*)    MCV 107.1 (*)    MCH 35.6 (*)    RDW 23.4 (*)    Platelets 116 (*)    All other components within normal limits  COMPREHENSIVE METABOLIC PANEL - Abnormal; Notable for the following:    AST 66 (*)    ALT 51 (*)    Total Bilirubin 3.3 (*)    All other components within normal limits  URINALYSIS, ROUTINE W REFLEX MICROSCOPIC - Abnormal; Notable for the following:    Color, Urine ORANGE (*)    APPearance CLOUDY (*)     Hgb urine dipstick MODERATE (*)    Bilirubin Urine MODERATE (*)    Urobilinogen, UA >8.0 (*)    Nitrite POSITIVE (*)    Leukocytes, UA LARGE (*)    All other components within normal limits  URINE MICROSCOPIC-ADD ON - Abnormal; Notable for the following:    Squamous Epithelial / LPF FEW (*)    Bacteria, UA MANY (*)    Crystals TRIPLE PHOSPHATE CRYSTALS (*)    All other components within normal limits  URINE CULTURE  RAPID HIV SCREEN (HIV 1/2 AB+AG)  PREGNANCY, URINE    Imaging Review Dg Chest 2 View  01/31/2015   CLINICAL DATA:  Leukemia and fatigue.  EXAM: CHEST  2 VIEW  COMPARISON:  January 04, 2015  FINDINGS: There is no edema or consolidation. Heart size and pulmonary vascularity are normal. No adenopathy. No bone lesions.  IMPRESSION: No edema or consolidation.   Electronically Signed   By: Lowella Grip III M.D.  On: 01/31/2015 16:38     EKG Interpretation None      MDM   Final diagnoses:  Fatigue  Urinary tract infection without hematuria, site unspecified  Pancytopenia  Chronic abdominal pain    4:53 PM Multiple attempts to contact Pt's PCP at [facility] without success.  Pt is unable to tell me why she was sent to the ED.    7:13 PM  Patient persists with pancytopenia, white blood cell count 3.7, hemoglobin 10.5 and platelets 116. Persistently elevated AST and ALTs at baseline. HIV which was not assessed at last admission is negative. Urinalysis shows persistent urinary tract infection. No culture was obtained during the last admission however previous culture from December 2015 shows colonization with Escherichia coli and susceptibility to ceftriaxone. Patient was previously treated with 3 days of ceftriaxone without resolution of her symptoms.  Pt initially given ceftriaxone here in the ED, but levaquin was added as previous 3 day course was not successful.     Pt reports she is unable to walk at home.  She will need PT/OT assessment as well as home  health.  Plan from last admission was bone marrow biopsy.  If negative, plan for d/c of lamictal.  Will pursue admission for treatment of UTI and bone marrow biopsy.  Marland Kitchen     7:38 PM Discussed with Dr. Chancy Milroy who will admit.    BP 104/63 mmHg  Pulse 94  Temp(Src) 99.6 F (37.6 C) (Oral)  Resp 16  SpO2 97%  LMP 09/13/2014   Jarrett Soho Khalessi Blough, PA-C 01/31/15 1938  Davonna Belling, MD 02/01/15 [phone]

## 2015-01-31 NOTE — Progress Notes (Addendum)
EDCM went to speak to patient at bedside, however, patient went to unit. Patient admitted with Pancytopenia. Per chart review patient was admitted to hospital on 03/16 to 03/19 with similar symptoms and findings.  Patient was not able to complete CT guided bone marrow biopsy as outpatient.  Patient listed as having Medicaid insurance.  Patient requesting bedside commode and wheelchair upon discharge as she cannot ambulate at home.  PT, OT consult ordered.

## 2015-01-31 NOTE — ED Notes (Signed)
Pt reports generalized weakness for past few months. Denies acute onset. No n/v/d. Pt complains of abd pain.

## 2015-01-31 NOTE — ED Notes (Signed)
Bed: JJ94 Expected date:  Expected time:  Means of arrival:  Comments: EMS- weakness, leukemia?

## 2015-01-31 NOTE — ED Notes (Signed)
Admitting MD at bedside w pt

## 2015-01-31 NOTE — ED Notes (Signed)
Per EMS. Pt complaining of generalized weakness. Went to PCP and they called EMS for weakness. Pt being worked up for leukemia at PCP.

## 2015-01-31 NOTE — ED Notes (Signed)
Pt was being transported upstairs by Pension scheme manager, when arrived at elevators pt started screaming, transport RN called house coverage to assist with getting pt on elevator, when arrived to 5th floor pt refused to get off elevator stating she was leaving AMA, pt signed [place] paperwork, brought back down to ED, IV taken out, pt taken out in wheelchair, spouse w/ pt and taking her home, Admitting MD notified pt was leaving, Admitting MD spoke with spouse about situation.

## 2015-02-03 LAB — URINE CULTURE: Colony Count: >=100000

## 2015-02-04 NOTE — Progress Notes (Signed)
ED Antimicrobial Stewardship Positive Culture Follow Up   Jordan [name] is an 49 y.o. female who presented to [facility] on 01/31/2015 with a chief complaint of  Chief Complaint  Patient presents with  . Fatigue    Recent Results (from the past 720 hour(s))  MRSA PCR Screening     Status: None   Collection Time: 01/05/15 12:33 PM  Result Value Ref Range Status   MRSA by PCR NEGATIVE NEGATIVE Final    Comment:        The GeneXpert MRSA Assay (FDA approved for NASAL specimens only), is one component of a comprehensive MRSA colonization surveillance program. It is not intended to diagnose MRSA infection nor to guide or monitor treatment for MRSA infections.   Urine culture     Status: None   Collection Time: 01/31/15  4:57 PM  Result Value Ref Range Status   Specimen Description URINE, CLEAN CATCH  Final   Special Requests NONE  Final   Colony Count   Final    >=100,000 COLONIES/ML Performed at [facility]    Culture   Final    ESCHERICHIA COLI Note: Confirmed Extended Spectrum Beta-Lactamase Producer (ESBL) Performed at [facility]    Report Status 02/03/2015 FINAL  Final   Organism ID, Bacteria ESCHERICHIA COLI  Final      Susceptibility   Escherichia coli - MIC*    AMPICILLIN >=32 RESISTANT Resistant     CEFAZOLIN >=64 RESISTANT Resistant     CEFTRIAXONE RESISTANT      CIPROFLOXACIN >=4 RESISTANT Resistant     GENTAMICIN <=1 SENSITIVE Sensitive     LEVOFLOXACIN >=8 RESISTANT Resistant     NITROFURANTOIN <=16 SENSITIVE Sensitive     TOBRAMYCIN >=16 RESISTANT Resistant     TRIMETH/SULFA >=320 RESISTANT Resistant     IMIPENEM <=0.25 SENSITIVE Sensitive     PIP/TAZO 8 SENSITIVE Sensitive     * ESCHERICHIA COLI    [x]  Patient discharged originally without antimicrobial agent and treatment is now indicated   C/o UTI. Hx UTI. Grew back ESBL ecoli that is still sens to nitrofurantoin. Renal function wnl.   New antibiotic prescription:   Macrobid  100mg  PO BID x 10 days  ED Provider: Clemens Catholic, PA  Onnie Boer, PharmD Pager: [phone] Infectious Diseases Pharmacist Phone# [phone]

## 2015-02-05 ENCOUNTER — Telehealth: Payer: Self-pay | Admitting: Emergency Medicine

## 2015-02-06 ENCOUNTER — Telehealth (HOSPITAL_COMMUNITY): Payer: Self-pay

## 2015-02-06 NOTE — ED Notes (Signed)
Unable to reach by telephone. Letter sent to address on record. Pt will need macrobid 100mg  po bid x 10 days when she returns call per Britt Bottom NP

## 2015-02-18 ENCOUNTER — Inpatient Hospital Stay (HOSPITAL_COMMUNITY): Payer: Medicaid Other

## 2015-02-18 ENCOUNTER — Encounter (HOSPITAL_COMMUNITY): Payer: Self-pay | Admitting: Emergency Medicine

## 2015-02-18 ENCOUNTER — Inpatient Hospital Stay (HOSPITAL_COMMUNITY)
Admission: EM | Admit: 2015-02-18 | Discharge: 2015-02-23 | DRG: 872 | Disposition: A | Discharge status: Home Health | Payer: Medicaid Other | Attending: Internal Medicine | Admitting: Internal Medicine

## 2015-02-18 ENCOUNTER — Emergency Department (HOSPITAL_COMMUNITY): Payer: Medicaid Other

## 2015-02-18 DIAGNOSIS — R000 Tachycardia, unspecified: Secondary | ICD-10-CM | POA: Diagnosis present

## 2015-02-18 DIAGNOSIS — E538 Deficiency of other specified B group vitamins: Secondary | ICD-10-CM | POA: Diagnosis present

## 2015-02-18 DIAGNOSIS — D61818 Other pancytopenia: Secondary | ICD-10-CM | POA: Diagnosis present

## 2015-02-18 DIAGNOSIS — N390 Urinary tract infection, site not specified: Secondary | ICD-10-CM | POA: Diagnosis not present

## 2015-02-18 DIAGNOSIS — E876 Hypokalemia: Secondary | ICD-10-CM | POA: Diagnosis present

## 2015-02-18 DIAGNOSIS — Z6841 Body Mass Index (BMI) 40.0 and over, adult: Secondary | ICD-10-CM | POA: Diagnosis not present

## 2015-02-18 DIAGNOSIS — F1021 Alcohol dependence, in remission: Secondary | ICD-10-CM | POA: Diagnosis present

## 2015-02-18 DIAGNOSIS — F40240 Claustrophobia: Secondary | ICD-10-CM | POA: Diagnosis present

## 2015-02-18 DIAGNOSIS — D531 Other megaloblastic anemias, not elsewhere classified: Secondary | ICD-10-CM | POA: Diagnosis not present

## 2015-02-18 DIAGNOSIS — A4151 Sepsis due to Escherichia coli [E. coli]: Secondary | ICD-10-CM | POA: Diagnosis present

## 2015-02-18 DIAGNOSIS — R072 Precordial pain: Secondary | ICD-10-CM | POA: Diagnosis not present

## 2015-02-18 DIAGNOSIS — R079 Chest pain, unspecified: Secondary | ICD-10-CM | POA: Diagnosis present

## 2015-02-18 DIAGNOSIS — F411 Generalized anxiety disorder: Secondary | ICD-10-CM | POA: Diagnosis present

## 2015-02-18 DIAGNOSIS — R740 Nonspecific elevation of levels of transaminase and lactic acid dehydrogenase [LDH]: Secondary | ICD-10-CM | POA: Diagnosis present

## 2015-02-18 DIAGNOSIS — Z1624 Resistance to multiple antibiotics: Secondary | ICD-10-CM | POA: Diagnosis present

## 2015-02-18 DIAGNOSIS — R0602 Shortness of breath: Secondary | ICD-10-CM | POA: Diagnosis present

## 2015-02-18 DIAGNOSIS — F3110 Bipolar disorder, current episode manic without psychotic features, unspecified: Secondary | ICD-10-CM | POA: Diagnosis not present

## 2015-02-18 DIAGNOSIS — F319 Bipolar disorder, unspecified: Secondary | ICD-10-CM | POA: Diagnosis not present

## 2015-02-18 DIAGNOSIS — A4150 Gram-negative sepsis, unspecified: Secondary | ICD-10-CM

## 2015-02-18 DIAGNOSIS — F310 Bipolar disorder, current episode hypomanic: Secondary | ICD-10-CM | POA: Diagnosis not present

## 2015-02-18 DIAGNOSIS — F3113 Bipolar disorder, current episode manic without psychotic features, severe: Secondary | ICD-10-CM | POA: Diagnosis not present

## 2015-02-18 DIAGNOSIS — R945 Abnormal results of liver function studies: Secondary | ICD-10-CM

## 2015-02-18 DIAGNOSIS — R7401 Elevation of levels of liver transaminase levels: Secondary | ICD-10-CM | POA: Diagnosis present

## 2015-02-18 DIAGNOSIS — B9620 Unspecified Escherichia coli [E. coli] as the cause of diseases classified elsewhere: Secondary | ICD-10-CM | POA: Diagnosis present

## 2015-02-18 DIAGNOSIS — R7989 Other specified abnormal findings of blood chemistry: Secondary | ICD-10-CM

## 2015-02-18 HISTORY — DX: Gram-negative sepsis, unspecified: A41.50

## 2015-02-18 HISTORY — DX: Other megaloblastic anemias, not elsewhere classified: D53.1

## 2015-02-18 LAB — COMPREHENSIVE METABOLIC PANEL
ALT: 44 U/L — ABNORMAL HIGH (ref 0–35)
AST: 74 U/L — ABNORMAL HIGH (ref 0–37)
Albumin: 3.9 g/dL (ref 3.5–5.2)
Alkaline Phosphatase: 84 U/L (ref 39–117)
Anion gap: 12 (ref 5–15)
BUN: 10 mg/dL (ref 6–23)
CO2: 24 mmol/L (ref 19–32)
Calcium: 9.3 mg/dL (ref 8.4–10.5)
Chloride: 107 mmol/L (ref 96–112)
Creatinine, Ser: 0.75 mg/dL (ref 0.50–1.10)
GFR calc Af Amer: >90 mL/min (ref >90)
GFR calc non Af Amer: >90 mL/min (ref >90)
Glucose, Bld: 93 mg/dL (ref 70–99)
Potassium: 3.5 mmol/L (ref 3.5–5.1)
Sodium: 143 mmol/L (ref 135–145)
Total Bilirubin: 3.6 mg/dL — ABNORMAL HIGH (ref 0.3–1.2)
Total Protein: 6.5 g/dL (ref 6.0–8.3)

## 2015-02-18 LAB — CBC WITH DIFFERENTIAL/PLATELET
Basophils Absolute: 0 10*3/uL (ref 0.0–0.1)
Basophils Relative: 0 % (ref 0–1)
Eosinophils Absolute: 0 10*3/uL (ref 0.0–0.7)
Eosinophils Relative: 0 % (ref 0–5)
HCT: 27.5 % — ABNORMAL LOW (ref 36.0–46.0)
Hemoglobin: 9.4 g/dL — ABNORMAL LOW (ref 12.0–15.0)
Lymphocytes Relative: 38 % (ref 12–46)
Lymphs Abs: 0.6 10*3/uL — ABNORMAL LOW (ref 0.7–4.0)
MCH: 35.2 pg — ABNORMAL HIGH (ref 26.0–34.0)
MCHC: 34.2 g/dL (ref 30.0–36.0)
MCV: 103 fL — ABNORMAL HIGH (ref 78.0–100.0)
Monocytes Absolute: 0 10*3/uL — ABNORMAL LOW (ref 0.1–1.0)
Monocytes Relative: 3 % (ref 3–12)
Neutro Abs: 0.9 10*3/uL — ABNORMAL LOW (ref 1.7–7.7)
Neutrophils Relative %: 59 % (ref 43–77)
Platelets: 89 10*3/uL — ABNORMAL LOW (ref 150–400)
RBC: 2.67 MIL/uL — ABNORMAL LOW (ref 3.87–5.11)
RDW: 26.1 % — ABNORMAL HIGH (ref 11.5–15.5)
WBC: 1.5 10*3/uL — ABNORMAL LOW (ref 4.0–10.5)

## 2015-02-18 LAB — URINALYSIS, ROUTINE W REFLEX MICROSCOPIC
Glucose, UA: NEGATIVE mg/dL
Hgb urine dipstick: NEGATIVE
Ketones, ur: 15 mg/dL — AB
Nitrite: POSITIVE — AB
Protein, ur: 30 mg/dL — AB
Specific Gravity, Urine: 1.024 (ref 1.005–1.030)
Urobilinogen, UA: 4 mg/dL — ABNORMAL HIGH (ref 0.0–1.0)
pH: 6 (ref 5.0–8.0)

## 2015-02-18 LAB — LACTIC ACID, PLASMA
Lactic Acid, Venous: 1.2 mmol/L (ref 0.5–2.0)
Lactic Acid, Venous: 1.9 mmol/L (ref 0.5–2.0)

## 2015-02-18 LAB — TROPONIN I
Troponin I: <0.03 ng/mL (ref <0.031)
Troponin I: <0.03 ng/mL (ref <0.031)
Troponin I: <0.03 ng/mL (ref <0.031)

## 2015-02-18 LAB — PROTIME-INR
INR: 1.04 (ref 0.00–1.49)
Prothrombin Time: 13.7 seconds (ref 11.6–15.2)

## 2015-02-18 LAB — AMMONIA: Ammonia: 30 umol/L (ref 11–32)

## 2015-02-18 LAB — APTT: aPTT: 30 seconds (ref 24–37)

## 2015-02-18 LAB — URINE MICROSCOPIC-ADD ON

## 2015-02-18 LAB — PROCALCITONIN: Procalcitonin: 0.13 ng/mL

## 2015-02-18 LAB — GLUCOSE, CAPILLARY: Glucose-Capillary: 99 mg/dL (ref 70–99)

## 2015-02-18 LAB — MRSA PCR SCREENING: MRSA by PCR: NEGATIVE

## 2015-02-18 MED ORDER — ADULT MULTIVITAMIN W/MINERALS CH
1.0000 | ORAL_TABLET | Freq: Every day | ORAL | Status: DC
  Administered 2015-02-18 – 2015-02-23 (×6): 1 via ORAL
  Filled 2015-02-18 (×6): qty 1

## 2015-02-18 MED ORDER — CITALOPRAM HYDROBROMIDE 20 MG PO TABS
20.0000 mg | ORAL_TABLET | Freq: Every morning | ORAL | Status: DC
  Administered 2015-02-19 – 2015-02-20 (×2): 20 mg via ORAL
  Filled 2015-02-18 (×2): qty 1

## 2015-02-18 MED ORDER — ALUM & MAG HYDROXIDE-SIMETH 200-200-20 MG/5ML PO SUSP: 30.0000 mL | Freq: Four times a day (QID) | ORAL | Status: DC | PRN

## 2015-02-18 MED ORDER — ONDANSETRON HCL 4 MG/2ML IJ SOLN: 4.0000 mg | Freq: Four times a day (QID) | INTRAMUSCULAR | Status: DC | PRN

## 2015-02-18 MED ORDER — SODIUM CHLORIDE 0.9 % IV SOLN
500.0000 mg | Freq: Four times a day (QID) | INTRAVENOUS | Status: DC
  Administered 2015-02-18 – 2015-02-23 (×20): 500 mg via INTRAVENOUS
  Filled 2015-02-18 (×24): qty 500

## 2015-02-18 MED ORDER — LORAZEPAM 2 MG/ML IJ SOLN: 1.0000 mg | Freq: Four times a day (QID) | INTRAMUSCULAR | Status: DC | PRN

## 2015-02-18 MED ORDER — SODIUM CHLORIDE 0.9 % IJ SOLN
10.0000 mL | Freq: Two times a day (BID) | INTRAMUSCULAR | Status: DC
  Administered 2015-02-18 – 2015-02-19 (×2): 10 mL
  Administered 2015-02-19: 20 mL
  Administered 2015-02-20 – 2015-02-22 (×5): 10 mL
  Administered 2015-02-23: 20 mL

## 2015-02-18 MED ORDER — SODIUM CHLORIDE 0.9 % IV SOLN
INTRAVENOUS | Status: DC
  Administered 2015-02-18 – 2015-02-19 (×2): via INTRAVENOUS

## 2015-02-18 MED ORDER — LORAZEPAM 2 MG/ML IJ SOLN
1.0000 mg | Freq: Once | INTRAMUSCULAR | Status: AC
  Administered 2015-02-18: 1 mg via INTRAVENOUS
  Filled 2015-02-18: qty 1

## 2015-02-18 MED ORDER — ENOXAPARIN SODIUM 80 MG/0.8ML ~~LOC~~ SOLN
70.0000 mg | SUBCUTANEOUS | Status: DC
  Administered 2015-02-18: 70 mg via SUBCUTANEOUS
  Filled 2015-02-18 (×2): qty 0.8

## 2015-02-18 MED ORDER — LAMOTRIGINE 100 MG PO TABS
100.0000 mg | ORAL_TABLET | Freq: Every morning | ORAL | Status: DC
  Administered 2015-02-19 – 2015-02-20 (×2): 100 mg via ORAL
  Filled 2015-02-18 (×2): qty 1

## 2015-02-18 MED ORDER — THIAMINE HCL 100 MG/ML IJ SOLN
100.0000 mg | Freq: Every day | INTRAMUSCULAR | Status: DC
  Filled 2015-02-18 (×2): qty 1

## 2015-02-18 MED ORDER — SODIUM CHLORIDE 0.9 % IJ SOLN
3.0000 mL | Freq: Two times a day (BID) | INTRAMUSCULAR | Status: DC
  Administered 2015-02-18 – 2015-02-23 (×6): 3 mL via INTRAVENOUS

## 2015-02-18 MED ORDER — FOLIC ACID 1 MG PO TABS
1.0000 mg | ORAL_TABLET | Freq: Every day | ORAL | Status: DC
  Administered 2015-02-19 – 2015-02-23 (×5): 1 mg via ORAL
  Filled 2015-02-18 (×5): qty 1

## 2015-02-18 MED ORDER — ALPRAZOLAM 0.25 MG PO TABS
0.2500 mg | ORAL_TABLET | Freq: Three times a day (TID) | ORAL | Status: DC | PRN
Start: 2015-02-18 — End: 2015-02-20
  Administered 2015-02-18 – 2015-02-19 (×2): 0.25 mg via ORAL
  Filled 2015-02-18 (×2): qty 1

## 2015-02-18 MED ORDER — SODIUM CHLORIDE 0.9 % IV BOLUS (SEPSIS)
1000.0000 mL | INTRAVENOUS | Status: AC
  Administered 2015-02-18 (×3): 1000 mL via INTRAVENOUS

## 2015-02-18 MED ORDER — SODIUM CHLORIDE 0.9 % IJ SOLN: 10.0000 mL | INTRAMUSCULAR | Status: DC | PRN

## 2015-02-18 MED ORDER — LORAZEPAM 1 MG PO TABS: 1.0000 mg | ORAL_TABLET | Freq: Four times a day (QID) | ORAL | Status: DC | PRN

## 2015-02-18 MED ORDER — ACETAMINOPHEN 325 MG PO TABS
650.0000 mg | ORAL_TABLET | Freq: Four times a day (QID) | ORAL | Status: DC | PRN
  Administered 2015-02-18 – 2015-02-22 (×3): 650 mg via ORAL
  Filled 2015-02-18 (×3): qty 2

## 2015-02-18 MED ORDER — ONDANSETRON HCL 4 MG PO TABS: 4.0000 mg | ORAL_TABLET | Freq: Four times a day (QID) | ORAL | Status: DC | PRN

## 2015-02-18 MED ORDER — ACETAMINOPHEN 650 MG RE SUPP: 650.0000 mg | Freq: Four times a day (QID) | RECTAL | Status: DC | PRN

## 2015-02-18 MED ORDER — SODIUM CHLORIDE 0.9 % IV BOLUS (SEPSIS)
500.0000 mL | INTRAVENOUS | Status: AC
  Administered 2015-02-18: 500 mL via INTRAVENOUS

## 2015-02-18 MED ORDER — LITHIUM CITRATE 300 MG/5 ML PO SYRP
300.0000 mg | Freq: Three times a day (TID) | ORAL | Status: DC
  Administered 2015-02-18 – 2015-02-20 (×6): 300 mg via ORAL
  Filled 2015-02-18 (×8): qty 5

## 2015-02-18 MED ORDER — VITAMIN B-1 100 MG PO TABS
100.0000 mg | ORAL_TABLET | Freq: Every day | ORAL | Status: DC
  Administered 2015-02-18 – 2015-02-23 (×6): 100 mg via ORAL
  Filled 2015-02-18 (×6): qty 1

## 2015-02-18 NOTE — ED Notes (Signed)
Unable to Gain IV access with [name]

## 2015-02-18 NOTE — ED Provider Notes (Signed)
CSN: [id number]     Arrival date & time 02/18/15  0725 History   First MD Initiated Contact with Patient 02/18/15 [phone]     Chief Complaint  Patient presents with  . Abdominal Pain    lower  . Chest Pain  . Shortness of Breath     HPI Per EMS- pt seen multiple times for lower abdominal pain and weakness and leaves AMA. Pt got into bathtub and felt too weak to get out and would not cooperate with EMS. EMS had to use straps to get her out. Pt also reports chest pain and shortness of breathe with anxiety. No IV access. Pt arrives with no clothes on. Husband has pt belongings.  Past Medical History  Diagnosis Date  . Mental disorder   . Bipolar 1 disorder   . Bipolar 1 disorder   . Yeast infection of the vagina   . Bipolar disorder   . Obesity   . UTI (lower urinary tract infection)   . Alcohol abuse   . Shortness of breath dyspnea   . Anemia    Past Surgical History  Procedure Laterality Date  . Cholecystectomy     History reviewed. No pertinent family history. History  Substance Use Topics  . Smoking status: Never Smoker   . Smokeless tobacco: Never Used  . Alcohol Use: No   OB History    Gravida Para Term Preterm AB TAB SAB Ectopic Multiple Living   1 1 1       1      Review of Systems  Unable to perform ROS     Allergies  Tramadol and Sulfa antibiotics  Home Medications   Prior to Admission medications   Medication Sig Start Date End Date Taking? Authorizing Provider  citalopram (CELEXA) 20 MG tablet Take 20 mg by mouth every morning.    Yes Historical Provider, MD  estrogens, conjugated, (PREMARIN) 0.625 MG tablet Take 0.625 mg by mouth daily. Take daily for 21 days then do not take for 7 days.   Yes Historical Provider, MD  gemfibrozil (LOPID) 600 MG tablet Take 600 mg by mouth 2 (two) times daily before a meal.   Yes Historical Provider, MD  lamoTRIgine (LAMICTAL) 100 MG tablet Take 100 mg by mouth every morning.    Yes Historical Provider, MD  LITHIUM PO  Take by mouth.   Yes Historical Provider, MD   BP 93/68 mmHg  Pulse 104  Temp(Src) 98.7 F (37.1 C) (Oral)  Resp 20  Ht 5\' 8"  (1.727 m)  Wt 318 lb (144.244 kg)  BMI 48.36 kg/m2  SpO2 100%  LMP 09/13/2014 Physical Exam  Constitutional: She appears well-developed and well-nourished. No distress.  HENT:  Head: Normocephalic and atraumatic.  Eyes: Pupils are equal, round, and reactive to light.  Neck: Normal range of motion.  Cardiovascular: Normal rate and intact distal pulses.   Pulmonary/Chest: No respiratory distress.  Abdominal: Normal appearance. She exhibits no distension. There is tenderness in the right upper quadrant and epigastric area.  Musculoskeletal: Normal range of motion.  Neurological: She is alert. No cranial nerve deficit.  Skin: Skin is warm and dry. No rash noted.  Psychiatric: Her mood appears anxious. Her speech is rapid and/or pressured.  Nursing note and vitals reviewed.   ED Course  Procedures (including critical care time)   Labs Review Labs Reviewed  CBC WITH DIFFERENTIAL/PLATELET - Abnormal; Notable for the following:    WBC 1.5 (*)    RBC 2.67 (*)  Hemoglobin 9.4 (*)    HCT 27.5 (*)    MCV 103.0 (*)    MCH 35.2 (*)    RDW 26.1 (*)    Platelets 89 (*)    Neutro Abs 0.9 (*)    Lymphs Abs 0.6 (*)    Monocytes Absolute 0.0 (*)    All other components within normal limits  COMPREHENSIVE METABOLIC PANEL - Abnormal; Notable for the following:    AST 74 (*)    ALT 44 (*)    Total Bilirubin 3.6 (*)    All other components within normal limits  URINALYSIS, ROUTINE W REFLEX MICROSCOPIC - Abnormal; Notable for the following:    Color, Urine ORANGE (*)    APPearance TURBID (*)    Bilirubin Urine MODERATE (*)    Ketones, ur 15 (*)    Protein, ur 30 (*)    Urobilinogen, UA 4.0 (*)    Nitrite POSITIVE (*)    Leukocytes, UA LARGE (*)    All other components within normal limits  LITHIUM LEVEL - Abnormal; Notable for the following:    Lithium  Lvl 0.27 (*)    All other components within normal limits  COMPREHENSIVE METABOLIC PANEL - Abnormal; Notable for the following:    Potassium 3.4 (*)    Chloride 112 (*)    Calcium 7.9 (*)    Total Protein 5.1 (*)    Albumin 3.1 (*)    AST 46 (*)    Total Bilirubin 2.5 (*)    All other components within normal limits  CBC - Abnormal; Notable for the following:    WBC 1.3 (*)    RBC 2.10 (*)    Hemoglobin 7.4 (*)    HCT 21.9 (*)    MCV 104.3 (*)    MCH 35.2 (*)    RDW 26.9 (*)    Platelets 75 (*)    All other components within normal limits  CULTURE, BLOOD (ROUTINE X 2)  CULTURE, BLOOD (ROUTINE X 2)  MRSA PCR SCREENING  URINE CULTURE  CLOSTRIDIUM DIFFICILE BY PCR  TROPONIN I  AMMONIA  LACTIC ACID, PLASMA  LACTIC ACID, PLASMA  PROCALCITONIN  PROTIME-INR  APTT  URINE MICROSCOPIC-ADD ON  TROPONIN I  TROPONIN I  TROPONIN I  GLUCOSE, CAPILLARY  HAPTOGLOBIN    Imaging Review US Abdomen Complete  02/18/2015   CLINICAL DATA:  Abnormal LFTs.  Alcohol abuse.  Cholecystectomy.  EXAM: ULTRASOUND ABDOMEN COMPLETE  COMPARISON:  CT of the abdomen and pelvis 01/06/2015  FINDINGS: Gallbladder: Status post cholecystectomy.  Common bile duct: Diameter: 4.1 mm  Liver: The liver is echogenic. There is attenuation of the ultrasound wave, poor visualization of the internal hepatic architecture, and loss of definition of the diaphragm. No focal lesions identified.  IVC: No abnormality visualized.  Pancreas: Limited visualization. Visualized portion is normal in appearance.  Spleen: 13.0 cm in length.  Splenic volume is 493.0 cubic cm.  Right Kidney: Length: 11.5 cm. Upper pole cyst is 1.4 x 0.9 x 1.3 cm. No hydronephrosis.  Left Kidney: Length: 10.9 cm . Echogenicity within normal limits. No mass or hydronephrosis visualized.  Abdominal aorta: Visualized portion is not aneurysmal.  Other findings: None.  IMPRESSION: 1. Status postcholecystectomy. 2. Fatty liver. 3. Splenomegaly.   Electronically  Signed   By: Nolon Nations M.D.   On: 02/18/2015 13:22   Dg Chest Portable 1 View  02/18/2015   CLINICAL DATA:  One day history of chest pain and shortness of breath  EXAM: PORTABLE  CHEST - 1 VIEW  COMPARISON:  January 31, 2015  FINDINGS: Lungs are clear. Heart size and pulmonary vascularity are normal. No adenopathy. No bone lesions. No pneumothorax.  IMPRESSION: No abnormality noted.   Electronically Signed   By: Lowella Grip III M.D.   On: 02/18/2015 08:06     EKG Interpretation   Date/Time:  Saturday February 18 2015 07:40:38 EDT Ventricular Rate:  96 PR Interval:  165 QRS Duration: 87 QT Interval:  359 QTC Calculation: 454 R Axis:   55 Text Interpretation:  Sinus rhythm Abnormal R-wave progression, early  transition Confirmed by Tatyana Biber  MD, Caio Devera ([id number]) on 02/18/2015 7:50:52 AM      MDM   Final diagnoses:  Abnormal LFTs        Leonard Schwartz, MD 02/19/15 1056

## 2015-02-18 NOTE — ED Notes (Signed)
Phlebotomy unable to obtain labs 

## 2015-02-18 NOTE — H&P (Signed)
[facility] History and Physical                                                                                    Nicoletta Hush, is a 49 y.o. female  MRN: [id number]   DOB - 1966-09-22  Admit Date - 02/18/2015  Outpatient Primary MD for the patient is Pcp Not In System  Referring MD: Dr. Loletta Specter  With History of -  Past Medical History  Diagnosis Date  . Mental disorder   . Bipolar 1 disorder   . Bipolar 1 disorder   . Yeast infection of the vagina   . Bipolar disorder   . Obesity   . UTI (lower urinary tract infection)   . Alcohol abuse   . Shortness of breath dyspnea   . Anemia       Past Surgical History  Procedure Laterality Date  . Cholecystectomy      in for   Chief Complaint  Patient presents with  . Abdominal Pain    lower  . Chest Pain  . Shortness of Breath     HPI This is a 29 female patient with significant past medical history of bipolar disorder with past history of alcoholism although she quit 7 years ago. She appears to have initially been diagnosed with pancytopenia mid March 2016. Patient reports she has been anemic with vitamin B12 deficiency since she delivered one of her children. Patient has been scheduled on multiple occasions to undergo bone marrow biopsy but either reports she could not tolerate laying down for the procedure was fearful of the procedure and never showed up for outpatient appointment. She has been evaluated by Dr. 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 In review of his notes the patient's platelets began to trend down October 2015 platelet count of 127,000 with a white count of 2300 and hemoglobin of 9. This patient was last evaluated our facility on 01/31/15 minute with a diagnosis of UTI. She left AMA before final culture results were returned and was not prescribed antibiotics and did not follow-up as an outpatient to obtain antibiotics or further treatment. Subsequently that urine culture  grew out ultra drug-resistant Escherichia coli sensitive primarily to Macrodantin and imipenem. Patient presents today with reports of sharp diffuse chest pain that has been constant in nature for greater than 2 weeks, generalized weakness and suprapubic pain. According to the patient's boyfriend who accompanies the patient she got into the bathtub and felt too weak to get out and would not cooperate with EMS to get out of the tub. EMS had to straps to get her out. During my evaluation of the patient she confirmed the above symptoms and reported severe lightheadedness and weakness when up walking or moving around which improved somewhat after seated position. He also endorsed poor appetite without any nausea vomiting or diarrhea. She is also reporting suprapubic abdominal pain. She denies blood in urine or stool. In describing her chest pain she describes this as sharp and knifelike in quality and involves the entire chest and has been constant for 2 weeks. She remains focused on her bipolar disorder and repeatedly asked me questions  regarding which floor she would be on stating she has a fear of heights. She did agree to remain in the hospital to complete the workup for all of her medical problems identified. When reviewing her medications she states she's been out of lithium for at least one week.  In the ER was afebrile and her blood pressure in a seated position has ranged from 97/40-113/77. She has been somewhat tachycardic with heart rates up to 110. She maintains room air saturations of 100%. Electrolyte panel is unremarkable except for mildly elevated AST and ALT but within patient's previously known baseline. Her total bilirubin continues to rise currently is 3.6 from a baseline of around 1.6-1.8. This is consistent with her known diagnosis of hemolysis with haptoglobin less than 10 during previous admission and elevated LDH. Her WBC has also decreased although isn't a typical range for her currently is  2500 with absolute neutrophil count of 0.9., hemoglobin stable 9.4, platelets are lower than normal for her 89,000.   Review of Systems   In addition to the HPI above,  No Fever-chills or other constitutional symptoms No Headache, changes with Vision or hearing, new weakness, tingling, numbness in any extremity, complains of lightheadedness with walking and activity No problems swallowing food or Liquids, indigestion/reflux No Cough or Shortness of Breath, palpitations, orthopnea or DOE No N/V; no melena or hematochezia, no dark tarry stools, Bowel movements are regular, No hematuria or flank pain No new skin rashes, lesions, masses or bruises, No new joints pains-aches No recent weight gain or loss No polyuria, polydypsia or polyphagia,  *A full 10 point Review of Systems was done, except as stated above, all other Review of Systems were negative.  Social History History  Substance Use Topics  . Smoking status: Never Smoker   . Smokeless tobacco: Never Used  . Alcohol Use:  quit 7 years ago     Resides at: Private residence  Lives with: Boyfriend  Ambulatory status: Ambulatory without assistive devices prior to admission   Family History Father, deceased-unknown type cancer Brother, issues with drug abuse and unknown psychiatric illness   Prior to Admission medications   Medication Sig Start Date End Date Taking? Authorizing Provider  citalopram (CELEXA) 20 MG tablet Take 20 mg by mouth every morning.    Yes Historical Provider, MD  estrogens, conjugated, (PREMARIN) 0.625 MG tablet Take 0.625 mg by mouth daily. Take daily for 21 days then do not take for 7 days.   Yes Historical Provider, MD  gemfibrozil (LOPID) 600 MG tablet Take 600 mg by mouth 2 (two) times daily before a meal.   Yes Historical Provider, MD  lamoTRIgine (LAMICTAL) 100 MG tablet Take 100 mg by mouth every morning.    Yes Historical Provider, MD  LITHIUM PO Take by mouth.   Yes Historical Provider, MD     Allergies  Allergen Reactions  . Tramadol Anaphylaxis, Shortness Of Breath and Swelling  . Sulfa Antibiotics Swelling    Physical Exam  Vitals  Blood pressure 104/64, pulse 92, temperature 98.1 F (36.7 C), temperature source Oral, resp. rate 19, height _0  (1.727 m), weight 318 lb (144.244 kg), last menstrual period 09/13/2014, SpO2 100 %.   General:  In no acute distress, appears healthy and well nourished, anxious, morbidly obese  Psych: Flat and anxious affect, Denies Suicidal or Homicidal ideations, Awake Alert, Oriented X 3, no apparent short term memory deficits, very focused on issue such as what floor her assigned room will be on an  concerned that Medicaid may only pay for one day of her hospitalization, does not appear to have full insight regarding the current seriousness of her medical problems and need for hospitalization  Neuro:   No focal neurological deficits, CN II through XII intact, Strength 5/5 all 4 extremities, Sensation intact all 4 extremities.  ENT:  Ears and Eyes appear Normal, Conjunctivae clear, PER. Moist oral mucosa without erythema or exudates.  Neck:  Supple, No lymphadenopathy appreciated  Respiratory:  Symmetrical chest wall movement, Good air movement bilaterally, CTAB. Room Air  Cardiac:  RRR, No Murmurs, no LE edema noted, no JVD, No carotid bruits, peripheral pulses palpable at 2+  Abdomen:  Positive bowel sounds, Soft, somewhat tender over suprapubic region, Non distended,  No masses appreciated, no obvious hepatosplenomegaly  Skin:  No Cyanosis, Normal Skin Turgor, No Skin Rash or Bruise.  Extremities: Symmetrical without obvious trauma or injury,  no effusions.  Data Review  CBC  Recent Labs Lab 02/18/15 0840  WBC 1.5*  HGB 9.4*  HCT 27.5*  PLT 89*  MCV 103.0*  MCH 35.2*  MCHC 34.2  RDW 26.1*  LYMPHSABS 0.6*  MONOABS 0.0*  EOSABS 0.0  BASOSABS 0.0    Chemistries   Recent Labs Lab 02/18/15 0840  NA 143  K 3.5   CL 107  CO2 24  GLUCOSE 93  BUN 10  CREATININE 0.75  CALCIUM 9.3  AST 74*  ALT 44*  ALKPHOS 84  BILITOT 3.6*    estimated creatinine clearance is 133.2 mL/min (by C-G formula based on Cr of 0.75).  No results for input(s): TSH, T4TOTAL, T3FREE, THYROIDAB in the last 72 hours.  Invalid input(s): FREET3  Coagulation profile No results for input(s): INR, PROTIME in the last 168 hours.  No results for input(s): DDIMER in the last 72 hours.  Cardiac Enzymes  Recent Labs Lab 02/18/15 0840  TROPONINI <0.03    Invalid input(s): POCBNP  Urinalysis    Component Value Date/Time   COLORURINE ORANGE* 01/31/2015 1657   APPEARANCEUR CLOUDY* 01/31/2015 1657   LABSPEC 1.021 01/31/2015 1657   PHURINE 6.0 01/31/2015 1657   GLUCOSEU NEGATIVE 01/31/2015 1657   HGBUR MODERATE* 01/31/2015 1657   BILIRUBINUR MODERATE* 01/31/2015 1657   KETONESUR NEGATIVE 01/31/2015 1657   PROTEINUR NEGATIVE 01/31/2015 1657   UROBILINOGEN >8.0* 01/31/2015 1657   NITRITE POSITIVE* 01/31/2015 1657   LEUKOCYTESUR LARGE* 01/31/2015 1657    Imaging results:   Dg Chest 2 View  01/31/2015   CLINICAL DATA:  Leukemia and fatigue.  EXAM: CHEST  2 VIEW  COMPARISON:  January 04, 2015  FINDINGS: There is no edema or consolidation. Heart size and pulmonary vascularity are normal. No adenopathy. No bone lesions.  IMPRESSION: No edema or consolidation.   Electronically Signed   By: Lowella Grip III M.D.   On: 01/31/2015 16:38   Dg Chest Portable 1 View  02/18/2015   CLINICAL DATA:  One day history of chest pain and shortness of breath  EXAM: PORTABLE CHEST - 1 VIEW  COMPARISON:  January 31, 2015  FINDINGS: Lungs are clear. Heart size and pulmonary vascularity are normal. No adenopathy. No bone lesions. No pneumothorax.  IMPRESSION: No abnormality noted.   Electronically Signed   By: Lowella Grip III M.D.   On: 02/18/2015 08:06     EKG: (Independently reviewed) sinus rhythm without any acute ischemic  changes   Assessment & Plan  Principal Problem:   Suspected evolving Sepsis due to Gram negative bacteria (MDR E  Coli) -Admit to stepdown -Suspect patient's lightheadedness may be reflective of orthostatic hypotension in setting of sepsis -Has known previously untreated multidrug-resistant urinary tract infection as likely source -Check blood cultures, Procalcitonin, repeat urinalysis and culture -IV fluid hydration -Check lactic acid and follow  Active Problems:   MDR E. coli UTI -See above -After repeat cultures obtained we'll begin empiric imipenem based on previous urine culture -Patient has recurrent UTIs and according to the boyfriend typically is only given a short course of antibiotics so recommend at least a 14 day course with current antibiotic therapy    Pancytopenia/transaminitis -Has been previously worked up and evaluated by Dr. 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 -Needs to have bone marrow aspiration done; have placed order in Epic for this but likely will need to recover from UTI and sepsis before can pursue -Patient does not follow up as an outpatient for this test so needs to be done prior to discharge    Chest pain -Very atypical in nature with normal EKG and normal troponin in setting of constant chest pain for 2 weeks -Suspect likely musculoskeletal etiology although could be reflective of symptomatic anemia or GI issues    Bipolar disorder -Continue all home medications -Patient has not been taking lithium for 1 week stating she has needed refill -resume this admission -Check lithium level in a.m.    DVT Prophylaxis: Lovenox  Family Communication: Boyfriend at bedside     Code Status: Full code  Condition:  Guarded  Discharge disposition: Anticipate discharge back to home with boyfriend; may benefit from psych evaluation this admission  Time spent in minutes : 60   Pritesh Sobecki L. ANP on 02/18/2015  at 11:41 AM  Between 7am to 7pm - Pager - [phone]  After 7pm go to [email or web address] - password [email or web address]  And look for the night coverage person covering me after hours  [facility]

## 2015-02-18 NOTE — Progress Notes (Signed)
ANTIBIOTIC CONSULT NOTE - INITIAL  Pharmacy Consult for primaxin Indication: sepsis, h/o untreated ESBL UTI  Allergies  Allergen Reactions  . Tramadol Anaphylaxis, Shortness Of Breath and Swelling  . Sulfa Antibiotics Swelling    Patient Measurements: Height: 5\' 8"  (172.7 cm) Weight: (!) 318 lb (144.244 kg) IBW/kg (Calculated) : 63.9   Vital Signs: Temp: 98.1 F (36.7 C) (04/30 0733) Temp Source: Oral (04/30 0733) BP: 104/64 mmHg (04/30 1100) Pulse Rate: 92 (04/30 1100) Intake/Output from previous day:   Intake/Output from this shift:    Labs:  Recent Labs  02/18/15 0840  WBC 1.5*  HGB 9.4*  PLT 89*  CREATININE 0.75   Estimated Creatinine Clearance: 133.2 mL/min (by C-G formula based on Cr of 0.75). No results for input(s): VANCOTROUGH, VANCOPEAK, VANCORANDOM, GENTTROUGH, GENTPEAK, GENTRANDOM, TOBRATROUGH, TOBRAPEAK, TOBRARND, AMIKACINPEAK, AMIKACINTROU, AMIKACIN in the last 72 hours.   Microbiology: Recent Results (from the past 720 hour(s))  Urine culture     Status: None   Collection Time: 01/31/15  4:57 PM  Result Value Ref Range Status   Specimen Description URINE, CLEAN CATCH  Final   Special Requests NONE  Final   Colony Count   Final    >=100,000 COLONIES/ML Performed at [facility]    Culture   Final    ESCHERICHIA COLI Note: Confirmed Extended Spectrum Beta-Lactamase Producer (ESBL) Performed at [facility]    Report Status 02/03/2015 FINAL  Final   Organism ID, Bacteria ESCHERICHIA COLI  Final      Susceptibility   Escherichia coli - MIC*    AMPICILLIN >=32 RESISTANT Resistant     CEFAZOLIN >=64 RESISTANT Resistant     CEFTRIAXONE RESISTANT      CIPROFLOXACIN >=4 RESISTANT Resistant     GENTAMICIN <=1 SENSITIVE Sensitive     LEVOFLOXACIN >=8 RESISTANT Resistant     NITROFURANTOIN <=16 SENSITIVE Sensitive     TOBRAMYCIN >=16 RESISTANT Resistant     TRIMETH/SULFA >=320 RESISTANT Resistant     IMIPENEM <=0.25 SENSITIVE  Sensitive     PIP/TAZO 8 SENSITIVE Sensitive     * ESCHERICHIA COLI    Medical History: Past Medical History  Diagnosis Date  . Mental disorder   . Bipolar 1 disorder   . Bipolar 1 disorder   . Yeast infection of the vagina   . Bipolar disorder   . Obesity   . UTI (lower urinary tract infection)   . Alcohol abuse   . Shortness of breath dyspnea   . Anemia     Medications:  See medication history Assessment: 49 yo lady who presents with UTI, r/o sepsis to start primaxin.  Her CrCl >100.   Goal of Therapy:  Eradication of infection  Plan:  Primaxin 500 mg IV q6 hours Monitor renal function, cultures and clinical course  Thanks for allowing pharmacy to be a part of this patient's care.  Excell Seltzer, PharmD Clinical Pharmacist, [phone]  02/18/2015,11:41 AM

## 2015-02-18 NOTE — ED Notes (Addendum)
4,261ml Fluids weight based for sepsis protocol. 1st bolus in process

## 2015-02-18 NOTE — Progress Notes (Signed)
Peripherally Inserted Central Catheter/Midline Placement  The IV Nurse has discussed with the patient and/or persons authorized to consent for the patient, the purpose of this procedure and the potential benefits and risks involved with this procedure.  The benefits include less needle sticks, lab draws from the catheter and patient may be discharged home with the catheter.  Risks include, but not limited to, infection, bleeding, blood clot (thrombus formation), and puncture of an artery; nerve damage and irregular heat beat.  Alternatives to this procedure were also discussed.  PICC/Midline Placement Documentation  PICC / Midline Double Lumen 56/31/49 PICC Right Basilic 38 cm 0 cm (Active)  Indication for Insertion or Continuance of Line Limited venous access - need for IV therapy >5 days (PICC only) 02/18/2015  4:47 PM  Exposed Catheter (cm) 0 cm 02/18/2015  4:47 PM  Site Assessment Clean;Dry;Intact 02/18/2015  4:47 PM  Lumen #1 Status Flushed;Saline locked;Blood return noted 02/18/2015  4:47 PM  Lumen #2 Status Flushed;Saline locked;Blood return noted 02/18/2015  4:47 PM  Dressing Type Transparent 02/18/2015  4:47 PM  Dressing Status Clean;Dry;Intact;Antimicrobial disc in place 02/18/2015  4:47 PM  Line Care Connections checked and tightened 02/18/2015  4:47 PM  Line Adjustment (NICU/IV Team Only) No 02/18/2015  4:47 PM  Dressing Intervention New dressing 02/18/2015  4:47 PM  Dressing Change Due 02/25/15 02/18/2015  4:47 PM       Rolena Infante 02/18/2015, 4:47 PM

## 2015-02-18 NOTE — ED Notes (Signed)
Per EMS- pt seen multiple times for lower abdominal pain and weakness and leaves AMA. Pt got into bathtub and felt too weak to get out and would not cooperate with EMS. EMS had to use straps to get her out. Pt also reports chest pain and shortness of breathe with anxiety. No IV access. Pt arrives with no clothes on. Husband has pt belongings.

## 2015-02-18 NOTE — ED Notes (Signed)
Patient transported to ultrasound.

## 2015-02-19 ENCOUNTER — Encounter (HOSPITAL_COMMUNITY): Payer: Self-pay | Admitting: Radiology

## 2015-02-19 DIAGNOSIS — R079 Chest pain, unspecified: Secondary | ICD-10-CM

## 2015-02-19 DIAGNOSIS — F3110 Bipolar disorder, current episode manic without psychotic features, unspecified: Secondary | ICD-10-CM

## 2015-02-19 LAB — COMPREHENSIVE METABOLIC PANEL
ALT: 32 U/L (ref 14–54)
AST: 46 U/L — ABNORMAL HIGH (ref 15–41)
Albumin: 3.1 g/dL — ABNORMAL LOW (ref 3.5–5.0)
Alkaline Phosphatase: 66 U/L (ref 38–126)
Anion gap: 6 (ref 5–15)
BUN: 8 mg/dL (ref 6–20)
CO2: 22 mmol/L (ref 22–32)
Calcium: 7.9 mg/dL — ABNORMAL LOW (ref 8.9–10.3)
Chloride: 112 mmol/L — ABNORMAL HIGH (ref 101–111)
Creatinine, Ser: 0.5 mg/dL (ref 0.44–1.00)
GFR calc Af Amer: >60 mL/min (ref >60)
GFR calc non Af Amer: >60 mL/min (ref >60)
Glucose, Bld: 89 mg/dL (ref 70–99)
Potassium: 3.4 mmol/L — ABNORMAL LOW (ref 3.5–5.1)
Sodium: 140 mmol/L (ref 135–145)
Total Bilirubin: 2.5 mg/dL — ABNORMAL HIGH (ref 0.3–1.2)
Total Protein: 5.1 g/dL — ABNORMAL LOW (ref 6.5–8.1)

## 2015-02-19 LAB — CBC
HCT: 21.9 % — ABNORMAL LOW (ref 36.0–46.0)
Hemoglobin: 7.4 g/dL — ABNORMAL LOW (ref 12.0–15.0)
MCH: 35.2 pg — ABNORMAL HIGH (ref 26.0–34.0)
MCHC: 33.8 g/dL (ref 30.0–36.0)
MCV: 104.3 fL — ABNORMAL HIGH (ref 78.0–100.0)
Platelets: 75 10*3/uL — ABNORMAL LOW (ref 150–400)
RBC: 2.1 MIL/uL — ABNORMAL LOW (ref 3.87–5.11)
RDW: 26.9 % — ABNORMAL HIGH (ref 11.5–15.5)
WBC: 1.3 10*3/uL — CL (ref 4.0–10.5)

## 2015-02-19 LAB — LITHIUM LEVEL: Lithium Lvl: 0.27 mmol/L — ABNORMAL LOW (ref 0.60–1.20)

## 2015-02-19 LAB — HAPTOGLOBIN: Haptoglobin: <10 mg/dL — ABNORMAL LOW (ref 34–200)

## 2015-02-19 LAB — TROPONIN I: Troponin I: <0.03 ng/mL (ref <0.031)

## 2015-02-19 LAB — CLOSTRIDIUM DIFFICILE BY PCR: Toxigenic C. Difficile by PCR: NEGATIVE

## 2015-02-19 MED ORDER — POTASSIUM CHLORIDE CRYS ER 20 MEQ PO TBCR
40.0000 meq | EXTENDED_RELEASE_TABLET | Freq: Once | ORAL | Status: AC
  Administered 2015-02-19: 40 meq via ORAL
  Filled 2015-02-19: qty 2

## 2015-02-19 NOTE — H&P (Signed)
Chief Complaint: Chief Complaint  Patient presents with  . Abdominal Pain    lower  . Chest Pain  . Shortness of Breath    Referring Physician(s): [facility]  History of Present Illness: Jordan [name] is a 49 y.o. female with a UTI on antibiotics-symptoms improving, afebrile, Blood Cx 4/30 no growth. Patient with Pancytopenia and IR received request for image guided bone marrow biopsy. She has previously been seen by Dr. Beryle Beams for this. She also c/o chest pain, DOE and lightheadedness, cardiac workup negative so far. The patient denies any history of sleep apnea or chronic oxygen use. She has previously tolerated anesthesia without complications. She is very afraid of heights and is concerned about the height of the CT scanner during the procedure.    Past Medical History  Diagnosis Date  . Mental disorder   . Bipolar 1 disorder   . Bipolar 1 disorder   . Yeast infection of the vagina   . Bipolar disorder   . Obesity   . UTI (lower urinary tract infection)   . Alcohol abuse   . Shortness of breath dyspnea   . Anemia     Past Surgical History  Procedure Laterality Date  . Cholecystectomy      Allergies: Tramadol and Sulfa antibiotics  Medications: Prior to Admission medications   Medication Sig Start Date End Date Taking? Authorizing Provider  citalopram (CELEXA) 20 MG tablet Take 20 mg by mouth every morning.    Yes Historical Provider, MD  estrogens, conjugated, (PREMARIN) 0.625 MG tablet Take 0.625 mg by mouth daily. Take daily for 21 days then do not take for 7 days.   Yes Historical Provider, MD  gemfibrozil (LOPID) 600 MG tablet Take 600 mg by mouth 2 (two) times daily before a meal.   Yes Historical Provider, MD  lamoTRIgine (LAMICTAL) 100 MG tablet Take 100 mg by mouth every morning.    Yes Historical Provider, MD  LITHIUM PO Take by mouth.   Yes Historical Provider, MD     History reviewed. No pertinent family history.  History   Social History  .  Marital Status: Single    Spouse Name: N/A  . Number of Children: N/A  . Years of Education: N/A   Social History Main Topics  . Smoking status: Never Smoker   . Smokeless tobacco: Never Used  . Alcohol Use: No  . Drug Use: No  . Sexual Activity: Yes    Birth Control/ Protection: None   Other Topics Concern  . None   Social History Narrative    Review of Systems: A 12 point ROS discussed and pertinent positives are indicated in the HPI above.  All other systems are negative.  Review of Systems  Vital Signs: BP 93/68 mmHg  Pulse 104  Temp(Src) 98.7 F (37.1 C) (Oral)  Resp 20  Ht 5' 8"  (1.727 m)  Wt 318 lb (144.244 kg)  BMI 48.36 kg/m2  SpO2 100%  LMP 09/13/2014  Physical Exam  Constitutional: She is oriented to person, place, and time. No distress.  HENT:  Head: Normocephalic and atraumatic.  Cardiovascular: Exam reveals no gallop and no friction rub.   No murmur heard. Tachycardic  Pulmonary/Chest: Effort normal and breath sounds normal. No respiratory distress. She has no wheezes. She has no rales.  Abdominal: Soft. Bowel sounds are normal. She exhibits no distension. There is no tenderness.  Neurological: She is alert and oriented to person, place, and time.  Skin: She is not  diaphoretic.    Mallampati Score:  MD Evaluation Airway: WNL Heart: WNL Abdomen: WNL Chest/ Lungs: WNL ASA  Classification: 3 Mallampati/Airway Score: Two  Imaging: Dg Chest 2 View  01/31/2015   CLINICAL DATA:  Leukemia and fatigue.  EXAM: CHEST  2 VIEW  COMPARISON:  January 04, 2015  FINDINGS: There is no edema or consolidation. Heart size and pulmonary vascularity are normal. No adenopathy. No bone lesions.  IMPRESSION: No edema or consolidation.   Electronically Signed   By: Lowella Grip III M.D.   On: 01/31/2015 16:38   US Abdomen Complete  02/18/2015   CLINICAL DATA:  Abnormal LFTs.  Alcohol abuse.  Cholecystectomy.  EXAM: ULTRASOUND ABDOMEN COMPLETE  COMPARISON:  CT of  the abdomen and pelvis 01/06/2015  FINDINGS: Gallbladder: Status post cholecystectomy.  Common bile duct: Diameter: 4.1 mm  Liver: The liver is echogenic. There is attenuation of the ultrasound wave, poor visualization of the internal hepatic architecture, and loss of definition of the diaphragm. No focal lesions identified.  IVC: No abnormality visualized.  Pancreas: Limited visualization. Visualized portion is normal in appearance.  Spleen: 13.0 cm in length.  Splenic volume is 493.0 cubic cm.  Right Kidney: Length: 11.5 cm. Upper pole cyst is 1.4 x 0.9 x 1.3 cm. No hydronephrosis.  Left Kidney: Length: 10.9 cm . Echogenicity within normal limits. No mass or hydronephrosis visualized.  Abdominal aorta: Visualized portion is not aneurysmal.  Other findings: None.  IMPRESSION: 1. Status postcholecystectomy. 2. Fatty liver. 3. Splenomegaly.   Electronically Signed   By: Nolon Nations M.D.   On: 02/18/2015 13:22   Dg Chest Portable 1 View  02/18/2015   CLINICAL DATA:  One day history of chest pain and shortness of breath  EXAM: PORTABLE CHEST - 1 VIEW  COMPARISON:  January 31, 2015  FINDINGS: Lungs are clear. Heart size and pulmonary vascularity are normal. No adenopathy. No bone lesions. No pneumothorax.  IMPRESSION: No abnormality noted.   Electronically Signed   By: Lowella Grip III M.D.   On: 02/18/2015 08:06    Labs:  CBC:  Recent Labs  01/24/15 0810 01/31/15 1700 02/18/15 0840 02/19/15 0533  WBC 4.9 3.7* 1.5* 1.3*  HGB 10.1* 10.5* 9.4* 7.4*  HCT 30.7* 31.6* 27.5* 21.9*  PLT 162 116* 89* 75*    COAGS:  Recent Labs  01/04/15 1609 01/05/15 1040 01/24/15 0810 02/18/15 1334  INR 1.01 1.10 1.04 1.04  APTT  --   --  33 30    BMP:  Recent Labs  01/07/15 0438 01/31/15 1700 02/18/15 0840 02/19/15 0533  NA 139 143 143 140  K 3.5 4.1 3.5 3.4*  CL 110 110 107 112*  CO2 23 24 24 22   GLUCOSE 86 90 93 89  BUN <5* 14 10 8   CALCIUM 8.9 8.8 9.3 7.9*  CREATININE 0.58 0.62 0.75  0.50  GFRNONAA >90 >90 >90 >60  GFRAA >90 >90 >90 >60    LIVER FUNCTION TESTS:  Recent Labs  01/07/15 0438 01/31/15 1700 02/18/15 0840 02/19/15 0533  BILITOT 1.6* 3.3* 3.6* 2.5*  AST 88* 66* 74* 46*  ALT 70* 51* 44* 32  ALKPHOS 81 86 84 66  PROT 5.9* 6.8 6.5 5.1*  ALBUMIN 3.6 4.1 3.9 3.1*   Assessment and Plan: UTI on antibiotics-symptoms improving, afebrile, Blood Cx 4/30 no growth Pancytopenia Request for image guided bone marrow biopsy with moderate sedation The patient will be NPO, no blood thinners taken, labs and vitals have been reviewed. Risks  and Benefits discussed with the patient including, but not limited to bleeding, infection, damage to adjacent structures or low yield requiring additional tests. All of the patient's questions were answered, patient is agreeable to proceed. Consent signed and in chart. Bipolar   Thank you for this interesting consult.  I greatly enjoyed meeting Persia Lintner and look forward to participating in their care.  SignedHedy Jacob 02/19/2015, 11:59 AM   I spent a total of 20 Minutes in face to face in clinical consultation, greater than 50% of which was counseling/coordinating care for pancytopenia

## 2015-02-19 NOTE — Progress Notes (Addendum)
PATIENT DETAILS Name: Jordan [name] Age: 49 y.o. Sex: female Date of Birth: Oct 17, 1966 Admit Date: 02/18/2015 Admitting Physician Annita Brod, MD PCP:Pcp Not In System  Subjective: Still continues to complain of weakness, fatigue and dizziness. No further chest pain.  Assessment/Plan: Principal Problem:   Sepsis due to Gram negative bacteria (MDR E Coli): Recent urine culture positive for ESBL Escherichia coli, continue imipenem. Await repeat urine culture, blood culture on 4/13 negative so far. Remains afebrile. However is leukopenic.  Active Problems:   ESBL Escherichia coli UTI: Last urine culture on 4/12 positive for ESBL Escherichia coli, however patient left AGAINST MEDICAL ADVICE. Continue imipenem, await repeat urine culture.    Pancytopenia: HIV, hepatitis profile negative. Vitamin B12 312, folate levels within normal limits. Because of anxiety/sedation issue she refused a bone marrow biopsy as an outpatient, she is now able to proceed with one-interventional radiology has been consulted. Will schedule with hematologist tomorrow. Hemoglobin down to 7.4, suspect this is secondary to IV fluid dilution, will type and screen, and transfuse if less than 7.    Chest pain: Very atypical, cardiac enzymes negative. Doubt further workup required at this time.    Bipolar disorder: Continue with Lamictal, lithium and Celexa. Personally reviewed prior pathology consultation-Lamictal can very rarely account for pancytopenia. We'll continue cautiously for now, if no other causes of pancytopenia felt to be evident-Lamictal may need to be discontinued.     Hypokalemia: Replete and recheck     Morbid obesity: Counseled.  Disposition: Remain inpatient  Antimicrobial agents  See below  Anti-infectives    Start     Dose/Rate Route Frequency Ordered Stop   02/18/15 1200  imipenem-cilastatin (PRIMAXIN) 500 mg in sodium chloride 0.9 % 100 mL IVPB     500 mg 200 mL/hr  over 30 Minutes Intravenous 4 times per day 02/18/15 1144        DVT Prophylaxis: Stop Prophylactic Lovenox-bone marrow biopsy tomorrow-and thrombocytopenia as well.   Code Status: Full code   Family Communication Boyfriend at bedside  Procedures: None  CONSULTS:  Interventional radiology  Time spent 40 minutes-which includes 50% of the time with face-to-face with patient/ family and coordinating care/reviewing notes from prior admission related to the above assessment and plan.  MEDICATIONS: Scheduled Meds: . citalopram  20 mg Oral q morning - 16X  . folic acid  1 mg Oral Daily  . imipenem-cilastatin  500 mg Intravenous 4 times per day  . lamoTRIgine  100 mg Oral q morning - 10a  . lithium citrate  300 mg Oral TID  . multivitamin with minerals  1 tablet Oral Daily  . sodium chloride  10-40 mL Intracatheter Q12H  . sodium chloride  3 mL Intravenous Q12H  . thiamine  100 mg Oral Daily   Or  . thiamine  100 mg Intravenous Daily   Continuous Infusions:  PRN Meds:.acetaminophen **OR** acetaminophen, ALPRAZolam, alum & mag hydroxide-simeth, ondansetron **OR** ondansetron (ZOFRAN) IV, sodium chloride    PHYSICAL EXAM: Vital signs in last 24 hours: Filed Vitals:   02/19/15 0230 02/19/15 0431 02/19/15 0909 02/19/15 1313  BP: 99/49 96/77 93/68  83/59  Pulse: 94 96 104 97  Temp:  98.3 F (36.8 C) 98.7 F (37.1 C) 98.6 F (37 C)  TempSrc:  Oral Oral Oral  Resp: 14 16 20 19   Height:      Weight:      SpO2: 96% 100% 100%  99%    Weight change:  Filed Weights   02/18/15 0733  Weight: 144.244 kg (318 lb)   Body mass index is 48.36 kg/(m^2).   Gen Exam: Awake and alert with clear speech.   Neck: Supple, No JVD.   Chest: B/L Clear.   CVS: S1 S2 Regular, no murmurs.  Abdomen: soft, BS +, non tender, non distended.  Extremities: no edema, lower extremities warm to touch. Neurologic: Non Focal.   Skin: No Rash.   Wounds: N/A.    Intake/Output from previous  day:  Intake/Output Summary (Last 24 hours) at 02/19/15 1325 Last data filed at 02/19/15 1300  Gross per 24 hour  Intake   2320 ml  Output    450 ml  Net   1870 ml     LAB RESULTS: CBC  Recent Labs Lab 02/18/15 0840 02/19/15 0533  WBC 1.5* 1.3*  HGB 9.4* 7.4*  HCT 27.5* 21.9*  PLT 89* 75*  MCV 103.0* 104.3*  MCH 35.2* 35.2*  MCHC 34.2 33.8  RDW 26.1* 26.9*  LYMPHSABS 0.6*  --   MONOABS 0.0*  --   EOSABS 0.0  --   BASOSABS 0.0  --     Chemistries   Recent Labs Lab 02/18/15 0840 02/19/15 0533  NA 143 140  K 3.5 3.4*  CL 107 112*  CO2 24 22  GLUCOSE 93 89  BUN 10 8  CREATININE 0.75 0.50  CALCIUM 9.3 7.9*    CBG:  Recent Labs Lab 02/18/15 1712  GLUCAP 99    GFR Estimated Creatinine Clearance: 133.2 mL/min (by C-G formula based on Cr of 0.5).  Coagulation profile  Recent Labs Lab 02/18/15 1334  INR 1.04    Cardiac Enzymes  Recent Labs Lab 02/18/15 1655 02/18/15 2149 02/19/15 0534  TROPONINI <0.03 <0.03 <0.03    Invalid input(s): POCBNP No results for input(s): DDIMER in the last 72 hours. No results for input(s): HGBA1C in the last 72 hours. No results for input(s): CHOL, HDL, LDLCALC, TRIG, CHOLHDL, LDLDIRECT in the last 72 hours. No results for input(s): TSH, T4TOTAL, T3FREE, THYROIDAB in the last 72 hours.  Invalid input(s): FREET3 No results for input(s): VITAMINB12, FOLATE, FERRITIN, TIBC, IRON, RETICCTPCT in the last 72 hours. No results for input(s): LIPASE, AMYLASE in the last 72 hours.  Urine Studies No results for input(s): UHGB, CRYS in the last 72 hours.  Invalid input(s): UACOL, UAPR, USPG, UPH, UTP, UGL, UKET, UBIL, UNIT, UROB, ULEU, UEPI, UWBC, URBC, UBAC, CAST, UCOM, BILUA  MICROBIOLOGY: Recent Results (from the past 240 hour(s))  Culture, blood (routine x 2)     Status: None (Preliminary result)   Collection Time: 02/18/15  1:30 PM  Result Value Ref Range Status   Specimen Description BLOOD RIGHT ARM  Final    Special Requests   Final    BOTTLES DRAWN AEROBIC AND ANAEROBIC 10CC BLUE 5CC RED   Culture   Final           BLOOD CULTURE RECEIVED NO GROWTH TO DATE CULTURE WILL BE HELD FOR 5 DAYS BEFORE ISSUING A FINAL NEGATIVE REPORT Performed at [facility]    Report Status PENDING  Incomplete  Culture, blood (routine x 2)     Status: None (Preliminary result)   Collection Time: 02/18/15  1:34 PM  Result Value Ref Range Status   Specimen Description BLOOD RIGHT HAND  Final   Special Requests BOTTLES DRAWN AEROBIC ONLY 10CC  Final   Culture   Final  BLOOD CULTURE RECEIVED NO GROWTH TO DATE CULTURE WILL BE HELD FOR 5 DAYS BEFORE ISSUING A FINAL NEGATIVE REPORT Performed at [facility]    Report Status PENDING  Incomplete  MRSA PCR Screening     Status: None   Collection Time: 02/18/15  2:18 PM  Result Value Ref Range Status   MRSA by PCR NEGATIVE NEGATIVE Final    Comment:        The GeneXpert MRSA Assay (FDA approved for NASAL specimens only), is one component of a comprehensive MRSA colonization surveillance program. It is not intended to diagnose MRSA infection nor to guide or monitor treatment for MRSA infections.     RADIOLOGY STUDIES/RESULTS: Dg Chest 2 View  01/31/2015   CLINICAL DATA:  Leukemia and fatigue.  EXAM: CHEST  2 VIEW  COMPARISON:  January 04, 2015  FINDINGS: There is no edema or consolidation. Heart size and pulmonary vascularity are normal. No adenopathy. No bone lesions.  IMPRESSION: No edema or consolidation.   Electronically Signed   By: Lowella Grip III M.D.   On: 01/31/2015 16:38   US Abdomen Complete  02/18/2015   CLINICAL DATA:  Abnormal LFTs.  Alcohol abuse.  Cholecystectomy.  EXAM: ULTRASOUND ABDOMEN COMPLETE  COMPARISON:  CT of the abdomen and pelvis 01/06/2015  FINDINGS: Gallbladder: Status post cholecystectomy.  Common bile duct: Diameter: 4.1 mm  Liver: The liver is echogenic. There is attenuation of the ultrasound wave, poor  visualization of the internal hepatic architecture, and loss of definition of the diaphragm. No focal lesions identified.  IVC: No abnormality visualized.  Pancreas: Limited visualization. Visualized portion is normal in appearance.  Spleen: 13.0 cm in length.  Splenic volume is 493.0 cubic cm.  Right Kidney: Length: 11.5 cm. Upper pole cyst is 1.4 x 0.9 x 1.3 cm. No hydronephrosis.  Left Kidney: Length: 10.9 cm . Echogenicity within normal limits. No mass or hydronephrosis visualized.  Abdominal aorta: Visualized portion is not aneurysmal.  Other findings: None.  IMPRESSION: 1. Status postcholecystectomy. 2. Fatty liver. 3. Splenomegaly.   Electronically Signed   By: Nolon Nations M.D.   On: 02/18/2015 13:22   Dg Chest Portable 1 View  02/18/2015   CLINICAL DATA:  One day history of chest pain and shortness of breath  EXAM: PORTABLE CHEST - 1 VIEW  COMPARISON:  January 31, 2015  FINDINGS: Lungs are clear. Heart size and pulmonary vascularity are normal. No adenopathy. No bone lesions. No pneumothorax.  IMPRESSION: No abnormality noted.   Electronically Signed   By: Lowella Grip III M.D.   On: 02/18/2015 08:06    Oren Binet, MD  [facility] Pager:[phone]  If 7PM-7AM, please contact night-coverage [email or web address] Password [email or web address] 02/19/2015, 1:25 PM   LOS: 1 day

## 2015-02-20 ENCOUNTER — Inpatient Hospital Stay (HOSPITAL_COMMUNITY): Payer: Medicaid Other

## 2015-02-20 DIAGNOSIS — F310 Bipolar disorder, current episode hypomanic: Secondary | ICD-10-CM

## 2015-02-20 DIAGNOSIS — F3113 Bipolar disorder, current episode manic without psychotic features, severe: Secondary | ICD-10-CM

## 2015-02-20 DIAGNOSIS — F40240 Claustrophobia: Secondary | ICD-10-CM | POA: Diagnosis present

## 2015-02-20 DIAGNOSIS — F411 Generalized anxiety disorder: Secondary | ICD-10-CM | POA: Diagnosis present

## 2015-02-20 LAB — URINE CULTURE: Colony Count: >=100000

## 2015-02-20 LAB — HEPATIC FUNCTION PANEL
ALT: 28 U/L (ref 14–54)
AST: 40 U/L (ref 15–41)
Albumin: 3.1 g/dL — ABNORMAL LOW (ref 3.5–5.0)
Alkaline Phosphatase: 60 U/L (ref 38–126)
Bilirubin, Direct: 0.6 mg/dL — ABNORMAL HIGH (ref 0.1–0.5)
Indirect Bilirubin: 1.4 mg/dL — ABNORMAL HIGH (ref 0.3–0.9)
Total Bilirubin: 2 mg/dL — ABNORMAL HIGH (ref 0.3–1.2)
Total Protein: 4.9 g/dL — ABNORMAL LOW (ref 6.5–8.1)

## 2015-02-20 LAB — BASIC METABOLIC PANEL
Anion gap: 6 (ref 5–15)
BUN: <5 mg/dL — ABNORMAL LOW (ref 6–20)
CO2: 23 mmol/L (ref 22–32)
Calcium: 8.4 mg/dL — ABNORMAL LOW (ref 8.9–10.3)
Chloride: 111 mmol/L (ref 101–111)
Creatinine, Ser: 0.57 mg/dL (ref 0.44–1.00)
GFR calc Af Amer: >60 mL/min (ref >60)
GFR calc non Af Amer: >60 mL/min (ref >60)
Glucose, Bld: 90 mg/dL (ref 70–99)
Potassium: 3.7 mmol/L (ref 3.5–5.1)
Sodium: 140 mmol/L (ref 135–145)

## 2015-02-20 LAB — CBC
HCT: 20.5 % — ABNORMAL LOW (ref 36.0–46.0)
Hemoglobin: 7 g/dL — ABNORMAL LOW (ref 12.0–15.0)
MCH: 35.9 pg — ABNORMAL HIGH (ref 26.0–34.0)
MCHC: 34.1 g/dL (ref 30.0–36.0)
MCV: 105.1 fL — ABNORMAL HIGH (ref 78.0–100.0)
Platelets: 90 10*3/uL — ABNORMAL LOW (ref 150–400)
RBC: 1.95 MIL/uL — ABNORMAL LOW (ref 3.87–5.11)
RDW: 26.9 % — ABNORMAL HIGH (ref 11.5–15.5)
WBC: 1.3 10*3/uL — CL (ref 4.0–10.5)

## 2015-02-20 LAB — GLUCOSE, CAPILLARY: Glucose-Capillary: 91 mg/dL (ref 70–99)

## 2015-02-20 LAB — BONE MARROW EXAM

## 2015-02-20 LAB — PATHOLOGIST SMEAR REVIEW

## 2015-02-20 MED ORDER — MIDAZOLAM HCL 2 MG/2ML IJ SOLN
INTRAMUSCULAR | Status: AC | PRN
  Administered 2015-02-20: 0.5 mg via INTRAVENOUS
  Administered 2015-02-20: 1 mg via INTRAVENOUS
  Administered 2015-02-20: 0.5 mg via INTRAVENOUS

## 2015-02-20 MED ORDER — ALPRAZOLAM 0.5 MG PO TABS
0.5000 mg | ORAL_TABLET | Freq: Three times a day (TID) | ORAL | Status: DC | PRN
  Administered 2015-02-21: 0.5 mg via ORAL
  Filled 2015-02-20 (×2): qty 1

## 2015-02-20 MED ORDER — QUETIAPINE FUMARATE 50 MG PO TABS
50.0000 mg | ORAL_TABLET | Freq: Three times a day (TID) | ORAL | Status: DC
  Administered 2015-02-20 – 2015-02-21 (×3): 50 mg via ORAL
  Filled 2015-02-20 (×3): qty 1

## 2015-02-20 MED ORDER — LIDOCAINE HCL 1 % IJ SOLN
INTRAMUSCULAR | Status: AC
  Filled 2015-02-20: qty 20

## 2015-02-20 MED ORDER — PAROXETINE HCL ER 25 MG PO TB24
25.0000 mg | ORAL_TABLET | Freq: Every day | ORAL | Status: DC
  Administered 2015-02-20 – 2015-02-23 (×4): 25 mg via ORAL
  Filled 2015-02-20 (×4): qty 1

## 2015-02-20 MED ORDER — FENTANYL CITRATE (PF) 100 MCG/2ML IJ SOLN
INTRAMUSCULAR | Status: AC
  Filled 2015-02-20: qty 4

## 2015-02-20 MED ORDER — MIDAZOLAM HCL 2 MG/2ML IJ SOLN
INTRAMUSCULAR | Status: AC
Start: 2015-02-20 — End: 2015-02-20
  Filled 2015-02-20: qty 4

## 2015-02-20 MED ORDER — FENTANYL CITRATE (PF) 100 MCG/2ML IJ SOLN
INTRAMUSCULAR | Status: AC | PRN
  Administered 2015-02-20 (×2): 50 ug via INTRAVENOUS

## 2015-02-20 NOTE — Progress Notes (Addendum)
PATIENT DETAILS Name: Jordan [name] Age: 49 y.o. Sex: female Date of Birth: 1966/06/07 Admit Date: 02/18/2015 Admitting Physician Annita Brod, MD PCP:Pcp Not In System  Subjective: Still continues to complain of weakness, fatigue and dizziness. No fever-very anxious.  Assessment/Plan: Principal Problem:   Sepsis due to Gram negative bacteria (MDR E Coli): Recent urine culture positive for ESBL Escherichia coli, continue imipenem.Repeat urine culture 4/30 prelim-gram neg rods , blood culture on 4/30 negative so far. Remains afebrile-however continues to be leukopenic.Continue Primaxin.  Active Problems:   ESBL Escherichia coli UTI: Last urine culture on 4/12 positive for ESBL Escherichia coli, however patient left AGAINST MEDICAL ADVICE. Continue imipenem,Repeat urine culture 4/30 prelim-gram neg rods    Pancytopenia: HIV, hepatitis profile negative. Vitamin B12 312, folate levels within normal limits. Because of anxiety/sedation issue she refused a bone marrow biopsy as an outpatient, she finally agreed and underwent a Bone marrow bx on 02/20/15. Spoke with Dr Beryle Beams over the phone today, who will provide more recommendations once bone marrow bx results are available. In the meantime, follow CBC-transfuse if Hb <7. Since some suspicion for medication (psych meds) induced pancytopenia, have consulted Psychiatry to see if we can adjust medications.  .    Chest pain: Very atypical, cardiac enzymes negative. Doubt further workup required at this time.    Bipolar disorder: Continue with Lamictal, lithium and Celexa. Personally reviewed prior hematology consultation-Lamictal can very rarely account for pancytopenia. have consulted Psychiatry to see if we can adjust medications.     Hypokalemia: Resolved, check periodically     Morbid obesity: Counseled.  Disposition: Remain inpatient-transfer to med surg  Antimicrobial agents  See below  Anti-infectives    Start     Dose/Rate Route Frequency Ordered Stop   02/18/15 1200  imipenem-cilastatin (PRIMAXIN) 500 mg in sodium chloride 0.9 % 100 mL IVPB     500 mg 200 mL/hr over 30 Minutes Intravenous 4 times per day 02/18/15 1144        DVT Prophylaxis: SCD's   Code Status: Full code   Family Communication Boyfriend at bedside  Procedures: Bone Marrow bx 5/2  CONSULTS:  Interventional radiology   Hematology-Dr Granfortuna  Time spent 25 minutes-which includes 50% of the time with face-to-face with patient/ family and coordinating care with Hematology  related to the above assessment and plan.  MEDICATIONS: Scheduled Meds: . citalopram  20 mg Oral q morning - 10a  . fentaNYL      . folic acid  1 mg Oral Daily  . imipenem-cilastatin  500 mg Intravenous 4 times per day  . lamoTRIgine  100 mg Oral q morning - 10a  . lidocaine      . lithium citrate  300 mg Oral TID  . midazolam      . multivitamin with minerals  1 tablet Oral Daily  . sodium chloride  10-40 mL Intracatheter Q12H  . sodium chloride  3 mL Intravenous Q12H  . thiamine  100 mg Oral Daily   Or  . thiamine  100 mg Intravenous Daily   Continuous Infusions:  PRN Meds:.acetaminophen **OR** acetaminophen, ALPRAZolam, alum & mag hydroxide-simeth, ondansetron **OR** ondansetron (ZOFRAN) IV, sodium chloride    PHYSICAL EXAM: Vital signs in last 24 hours: Filed Vitals:   02/20/15 1030 02/20/15 1035 02/20/15 1040 02/20/15 1057  BP: 117/68 118/65 111/63 100/57  Pulse: 94 96 100 96  Temp:  TempSrc:      Resp: 21 19 26 21   Height:      Weight:      SpO2: 100% 100% 100% 100%    Weight change: 1.356 kg (2 lb 15.8 oz) Filed Weights   02/18/15 0733 02/20/15 0346  Weight: 144.244 kg (318 lb) 145.6 kg (320 lb 15.8 oz)   Body mass index is 48.82 kg/(m^2).   Gen Exam: Awake and alert with clear speech.  Anxious Neck: Supple, No JVD.   Chest: B/L Clear.  No rales CVS: S1 S2 Regular, no murmurs.  Abdomen: soft, BS  +, non tender, non distended.  Extremities: no edema, lower extremities warm to touch. Neurologic: Non Focal.   Skin: No Rash.   Wounds: N/A.    Intake/Output from previous day:  Intake/Output Summary (Last 24 hours) at 02/20/15 1129 Last data filed at 02/20/15 0825  Gross per 24 hour  Intake    300 ml  Output    600 ml  Net   -300 ml     LAB RESULTS: CBC  Recent Labs Lab 02/18/15 0840 02/19/15 0533 02/20/15 0525  WBC 1.5* 1.3* 1.3*  HGB 9.4* 7.4* 7.0*  HCT 27.5* 21.9* 20.5*  PLT 89* 75* 90*  MCV 103.0* 104.3* 105.1*  MCH 35.2* 35.2* 35.9*  MCHC 34.2 33.8 34.1  RDW 26.1* 26.9* 26.9*  LYMPHSABS 0.6*  --   --   MONOABS 0.0*  --   --   EOSABS 0.0  --   --   BASOSABS 0.0  --   --     Chemistries   Recent Labs Lab 02/18/15 0840 02/19/15 0533 02/20/15 0525  NA 143 140 140  K 3.5 3.4* 3.7  CL 107 112* 111  CO2 24 22 23   GLUCOSE 93 89 90  BUN 10 8 <5*  CREATININE 0.75 0.50 0.57  CALCIUM 9.3 7.9* 8.4*    CBG:  Recent Labs Lab 02/18/15 1712  GLUCAP 99    GFR Estimated Creatinine Clearance: 134 mL/min (by C-G formula based on Cr of 0.57).  Coagulation profile  Recent Labs Lab 02/18/15 1334  INR 1.04    Cardiac Enzymes  Recent Labs Lab 02/18/15 1655 02/18/15 2149 02/19/15 0534  TROPONINI <0.03 <0.03 <0.03    Invalid input(s): POCBNP No results for input(s): DDIMER in the last 72 hours. No results for input(s): HGBA1C in the last 72 hours. No results for input(s): CHOL, HDL, LDLCALC, TRIG, CHOLHDL, LDLDIRECT in the last 72 hours. No results for input(s): TSH, T4TOTAL, T3FREE, THYROIDAB in the last 72 hours.  Invalid input(s): FREET3 No results for input(s): VITAMINB12, FOLATE, FERRITIN, TIBC, IRON, RETICCTPCT in the last 72 hours. No results for input(s): LIPASE, AMYLASE in the last 72 hours.  Urine Studies No results for input(s): UHGB, CRYS in the last 72 hours.  Invalid input(s): UACOL, UAPR, USPG, UPH, UTP, UGL, UKET, UBIL,  UNIT, UROB, ULEU, UEPI, UWBC, URBC, UBAC, CAST, UCOM, BILUA  MICROBIOLOGY: Recent Results (from the past 240 hour(s))  Urine culture     Status: None (Preliminary result)   Collection Time: 02/18/15 12:08 PM  Result Value Ref Range Status   Specimen Description URINE, CLEAN CATCH  Final   Special Requests NONE  Final   Colony Count   Final    >=100,000 COLONIES/ML Performed at [place] Performed at [facility]    Report Status PENDING  Incomplete  Culture, blood (  routine x 2)     Status: None (Preliminary result)   Collection Time: 02/18/15  1:30 PM  Result Value Ref Range Status   Specimen Description BLOOD RIGHT ARM  Final   Special Requests   Final    BOTTLES DRAWN AEROBIC AND ANAEROBIC 10CC BLUE 5CC RED   Culture   Final           BLOOD CULTURE RECEIVED NO GROWTH TO DATE CULTURE WILL BE HELD FOR 5 DAYS BEFORE ISSUING A FINAL NEGATIVE REPORT Performed at [facility]    Report Status PENDING  Incomplete  Culture, blood (routine x 2)     Status: None (Preliminary result)   Collection Time: 02/18/15  1:34 PM  Result Value Ref Range Status   Specimen Description BLOOD RIGHT HAND  Final   Special Requests BOTTLES DRAWN AEROBIC ONLY 10CC  Final   Culture   Final           BLOOD CULTURE RECEIVED NO GROWTH TO DATE CULTURE WILL BE HELD FOR 5 DAYS BEFORE ISSUING A FINAL NEGATIVE REPORT Performed at [facility]    Report Status PENDING  Incomplete  MRSA PCR Screening     Status: None   Collection Time: 02/18/15  2:18 PM  Result Value Ref Range Status   MRSA by PCR NEGATIVE NEGATIVE Final    Comment:        The GeneXpert MRSA Assay (FDA approved for NASAL specimens only), is one component of a comprehensive MRSA colonization surveillance program. It is not intended to diagnose MRSA infection nor to guide or monitor treatment for MRSA infections.   Clostridium Difficile by PCR     Status: None    Collection Time: 02/19/15 10:18 AM  Result Value Ref Range Status   C difficile by pcr NEGATIVE NEGATIVE Final    RADIOLOGY STUDIES/RESULTS: Dg Chest 2 View  01/31/2015   CLINICAL DATA:  Leukemia and fatigue.  EXAM: CHEST  2 VIEW  COMPARISON:  January 04, 2015  FINDINGS: There is no edema or consolidation. Heart size and pulmonary vascularity are normal. No adenopathy. No bone lesions.  IMPRESSION: No edema or consolidation.   Electronically Signed   By: Lowella Grip III M.D.   On: 01/31/2015 16:38   US Abdomen Complete  02/18/2015   CLINICAL DATA:  Abnormal LFTs.  Alcohol abuse.  Cholecystectomy.  EXAM: ULTRASOUND ABDOMEN COMPLETE  COMPARISON:  CT of the abdomen and pelvis 01/06/2015  FINDINGS: Gallbladder: Status post cholecystectomy.  Common bile duct: Diameter: 4.1 mm  Liver: The liver is echogenic. There is attenuation of the ultrasound wave, poor visualization of the internal hepatic architecture, and loss of definition of the diaphragm. No focal lesions identified.  IVC: No abnormality visualized.  Pancreas: Limited visualization. Visualized portion is normal in appearance.  Spleen: 13.0 cm in length.  Splenic volume is 493.0 cubic cm.  Right Kidney: Length: 11.5 cm. Upper pole cyst is 1.4 x 0.9 x 1.3 cm. No hydronephrosis.  Left Kidney: Length: 10.9 cm . Echogenicity within normal limits. No mass or hydronephrosis visualized.  Abdominal aorta: Visualized portion is not aneurysmal.  Other findings: None.  IMPRESSION: 1. Status postcholecystectomy. 2. Fatty liver. 3. Splenomegaly.   Electronically Signed   By: Nolon Nations M.D.   On: 02/18/2015 13:22   Dg Chest Portable 1 View  02/18/2015   CLINICAL DATA:  One day history of chest pain and shortness of breath  EXAM: PORTABLE CHEST - 1 VIEW  COMPARISON:  January 31, 2015  FINDINGS: Lungs are clear. Heart size and pulmonary vascularity are normal. No adenopathy. No bone lesions. No pneumothorax.  IMPRESSION: No abnormality noted.    Electronically Signed   By: Lowella Grip III M.D.   On: 02/18/2015 08:06    Oren Binet, MD  [facility] Pager:[phone]  If 7PM-7AM, please contact night-coverage [email or web address] Password [email or web address] 02/20/2015, 11:29 AM   LOS: 2 days
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
## 2015-02-20 NOTE — Procedures (Signed)
BM aspirate/core R iliac No comp

## 2015-02-20 NOTE — Progress Notes (Signed)
ANTIBIOTIC CONSULT NOTE - INITIAL  Pharmacy Consult for primaxin Indication: sepsis, h/o untreated ESBL UTI  Allergies  Allergen Reactions  . Tramadol Anaphylaxis, Shortness Of Breath and Swelling  . Sulfa Antibiotics Swelling    Patient Measurements: Height: 5\' 8"  (172.7 cm) Weight: (!) 320 lb 15.8 oz (145.6 kg) IBW/kg (Calculated) : 63.9   Vital Signs: Temp: 98 F (36.7 C) (05/02 0346) Temp Source: Oral (05/02 0346) BP: 112/70 mmHg (05/02 0346) Pulse Rate: 97 (05/02 0346) Intake/Output from previous day: 05/01 0701 - 05/02 0700 In: 560 [P.O.:240; I.V.:20; IV Piggyback:300] Out: 525 [Urine:525] Intake/Output from this shift:    Labs:  Recent Labs  02/18/15 0840 02/19/15 0533 02/20/15 0525  WBC 1.5* 1.3* 1.3*  HGB 9.4* 7.4* 7.0*  PLT 89* 75* 90*  CREATININE 0.75 0.50 0.57   Estimated Creatinine Clearance: 134 mL/min (by C-G formula based on Cr of 0.57). No results for input(s): VANCOTROUGH, VANCOPEAK, VANCORANDOM, GENTTROUGH, GENTPEAK, GENTRANDOM, TOBRATROUGH, TOBRAPEAK, TOBRARND, AMIKACINPEAK, AMIKACINTROU, AMIKACIN in the last 72 hours.   Microbiology: Recent Results (from the past 720 hour(s))  Urine culture     Status: None   Collection Time: 01/31/15  4:57 PM  Result Value Ref Range Status   Specimen Description URINE, CLEAN CATCH  Final   Special Requests NONE  Final   Colony Count   Final    >=100,000 COLONIES/ML Performed at [facility]    Culture   Final    ESCHERICHIA COLI Note: Confirmed Extended Spectrum Beta-Lactamase Producer (ESBL) Performed at [facility]    Report Status 02/03/2015 FINAL  Final   Organism ID, Bacteria ESCHERICHIA COLI  Final      Susceptibility   Escherichia coli - MIC*    AMPICILLIN >=32 RESISTANT Resistant     CEFAZOLIN >=64 RESISTANT Resistant     CEFTRIAXONE RESISTANT      CIPROFLOXACIN >=4 RESISTANT Resistant     GENTAMICIN <=1 SENSITIVE Sensitive     LEVOFLOXACIN >=8 RESISTANT Resistant     NITROFURANTOIN <=16 SENSITIVE Sensitive     TOBRAMYCIN >=16 RESISTANT Resistant     TRIMETH/SULFA >=320 RESISTANT Resistant     IMIPENEM <=0.25 SENSITIVE Sensitive     PIP/TAZO 8 SENSITIVE Sensitive     * ESCHERICHIA COLI  Urine culture     Status: None (Preliminary result)   Collection Time: 02/18/15 12:08 PM  Result Value Ref Range Status   Specimen Description URINE, CLEAN CATCH  Final   Special Requests NONE  Final   Colony Count   Final    >=100,000 COLONIES/ML Performed at [facility]    Culture   Final    ESCHERICHIA COLI Performed at [facility]    Report Status PENDING  Incomplete  Culture, blood (routine x 2)     Status: None (Preliminary result)   Collection Time: 02/18/15  1:30 PM  Result Value Ref Range Status   Specimen Description BLOOD RIGHT ARM  Final   Special Requests   Final    BOTTLES DRAWN AEROBIC AND ANAEROBIC 10CC BLUE 5CC RED   Culture   Final           BLOOD CULTURE RECEIVED NO GROWTH TO DATE CULTURE WILL BE HELD FOR 5 DAYS BEFORE ISSUING A FINAL NEGATIVE REPORT Performed at [facility]    Report Status PENDING  Incomplete  Culture, blood (routine x 2)     Status: None (Preliminary result)   Collection Time: 02/18/15  1:34 PM  Result Value  Ref Range Status   Specimen Description BLOOD RIGHT HAND  Final   Special Requests BOTTLES DRAWN AEROBIC ONLY 10CC  Final   Culture   Final           BLOOD CULTURE RECEIVED NO GROWTH TO DATE CULTURE WILL BE HELD FOR 5 DAYS BEFORE ISSUING A FINAL NEGATIVE REPORT Performed at [facility]    Report Status PENDING  Incomplete  MRSA PCR Screening     Status: None   Collection Time: 02/18/15  2:18 PM  Result Value Ref Range Status   MRSA by PCR NEGATIVE NEGATIVE Final    Comment:        The GeneXpert MRSA Assay (FDA approved for NASAL specimens only), is one component of a comprehensive MRSA colonization surveillance program. It is not intended to diagnose MRSA infection  nor to guide or monitor treatment for MRSA infections.   Clostridium Difficile by PCR     Status: None   Collection Time: 02/19/15 10:18 AM  Result Value Ref Range Status   C difficile by pcr NEGATIVE NEGATIVE Final    Medical History: Past Medical History  Diagnosis Date  . Mental disorder   . Bipolar 1 disorder   . Bipolar 1 disorder   . Yeast infection of the vagina   . Bipolar disorder   . Obesity   . UTI (lower urinary tract infection)   . Alcohol abuse   . Shortness of breath dyspnea   . Anemia     Medications:  Scheduled:  . citalopram  20 mg Oral q morning - 32N  . folic acid  1 mg Oral Daily  . imipenem-cilastatin  500 mg Intravenous 4 times per day  . lamoTRIgine  100 mg Oral q morning - 10a  . lithium citrate  300 mg Oral TID  . multivitamin with minerals  1 tablet Oral Daily  . sodium chloride  10-40 mL Intracatheter Q12H  . sodium chloride  3 mL Intravenous Q12H  . thiamine  100 mg Oral Daily   Or  . thiamine  100 mg Intravenous Daily    Assessment: 49 yo female with E coli UTI (history ESBL) on primaxin. WBC= 1.3, afebrile, SCr= 0.57 and CrCl >100. Culture sensitivities pending.   4/30 primaxin>>  5/1 C.diff > neg 4/30 BCx x 2 > ngtd 4/30 UCx > E coli  Plan:  -Continue imipenem 565m gIV q6h -Will follow culture sensitivities and renal function  Hildred Laser, Pharm D 02/20/2015 8:08 AM

## 2015-02-21 DIAGNOSIS — F319 Bipolar disorder, unspecified: Secondary | ICD-10-CM

## 2015-02-21 LAB — CBC WITH DIFFERENTIAL/PLATELET
Basophils Absolute: 0 10*3/uL (ref 0.0–0.1)
Basophils Relative: 0 % (ref 0–1)
Eosinophils Absolute: 0 10*3/uL (ref 0.0–0.7)
Eosinophils Relative: 0 % (ref 0–5)
HCT: 19.8 % — ABNORMAL LOW (ref 36.0–46.0)
Hemoglobin: 6.8 g/dL — CL (ref 12.0–15.0)
Lymphocytes Relative: 60 % — ABNORMAL HIGH (ref 12–46)
Lymphs Abs: 0.7 10*3/uL (ref 0.7–4.0)
MCH: 35.2 pg — ABNORMAL HIGH (ref 26.0–34.0)
MCHC: 34.3 g/dL (ref 30.0–36.0)
MCV: 102.6 fL — ABNORMAL HIGH (ref 78.0–100.0)
Monocytes Absolute: 0 10*3/uL — ABNORMAL LOW (ref 0.1–1.0)
Monocytes Relative: 2 % — ABNORMAL LOW (ref 3–12)
Neutro Abs: 0.4 10*3/uL — ABNORMAL LOW (ref 1.7–7.7)
Neutrophils Relative %: 38 % — ABNORMAL LOW (ref 43–77)
Platelets: 68 10*3/uL — ABNORMAL LOW (ref 150–400)
RBC: 1.93 MIL/uL — ABNORMAL LOW (ref 3.87–5.11)
RDW: 26.8 % — ABNORMAL HIGH (ref 11.5–15.5)
WBC: 1.1 10*3/uL — CL (ref 4.0–10.5)

## 2015-02-21 LAB — CBC
HCT: 22.6 % — ABNORMAL LOW (ref 36.0–46.0)
Hemoglobin: 7.8 g/dL — ABNORMAL LOW (ref 12.0–15.0)
MCH: 33.8 pg (ref 26.0–34.0)
MCHC: 34.5 g/dL (ref 30.0–36.0)
MCV: 97.8 fL (ref 78.0–100.0)
Platelets: 70 10*3/uL — ABNORMAL LOW (ref 150–400)
RBC: 2.31 MIL/uL — ABNORMAL LOW (ref 3.87–5.11)
WBC: 1.5 10*3/uL — ABNORMAL LOW (ref 4.0–10.5)

## 2015-02-21 LAB — PREPARE RBC (CROSSMATCH)

## 2015-02-21 MED ORDER — DIPHENHYDRAMINE HCL 50 MG/ML IJ SOLN: 25.0000 mg | Freq: Once | INTRAMUSCULAR | Status: DC

## 2015-02-21 MED ORDER — SODIUM CHLORIDE 0.9 % IV SOLN: Freq: Once | INTRAVENOUS | Status: DC

## 2015-02-21 MED ORDER — FUROSEMIDE 10 MG/ML IJ SOLN
20.0000 mg | Freq: Once | INTRAMUSCULAR | Status: AC
  Administered 2015-02-21: 20 mg via INTRAVENOUS
  Filled 2015-02-21: qty 2

## 2015-02-21 MED ORDER — QUETIAPINE FUMARATE 50 MG PO TABS
50.0000 mg | ORAL_TABLET | Freq: Two times a day (BID) | ORAL | Status: DC
  Administered 2015-02-22: 50 mg via ORAL
  Filled 2015-02-21: qty 1

## 2015-02-21 MED ORDER — CYANOCOBALAMIN 1000 MCG/ML IJ SOLN
1.0000 mg | Freq: Every day | INTRAMUSCULAR | Status: DC
  Administered 2015-02-21 – 2015-02-22 (×2): 1000 ug via INTRAMUSCULAR
  Filled 2015-02-21 (×3): qty 1

## 2015-02-21 MED ORDER — ACETAMINOPHEN 325 MG PO TABS: 650.0000 mg | ORAL_TABLET | Freq: Once | ORAL | Status: DC

## 2015-02-21 NOTE — Progress Notes (Signed)
Patient ID: [name], female   DOB: [birth year], 49 y.o.   MRN: [id number] Hematology: 49 year old woman on chronic medications to control bipolar disorder who I saw in consultation at time of recent admission 01/05/2015 for further evaluation of pancytopenia. Please see my consult note for full details. Initial CBC with hemoglobin 10.6 which fell to 8.7 overnight, MCV 111, white count 2200 with 50% neutrophils, 47 lymphocytes, 3 monocytes, and platelet count 111,000. Reticulocyte count 1% inappropriately low for that degree of anemia. LDH high at 712. Haptoglobin less than 10. Total bilirubin 1.8, 0.5 direct,  1.3 indirect. B12 347. Folic acid greater than 20. TSH 2.46. Ferritin 618. Hepatitis A, B, C, HIV negative. CT scan abdomen and pelvis without lymphadenopathy or organomegaly. No adenopathy on exam. Review of the peripheral blood showed a dimorphic population of red cells with macrocytes mixed with normal sized cells. No polychromasia. No spherocytes. No schistocytes. Rare teardrop cell. Total white cell count decreased but neutrophils appeared mature. No multilobed neutrophils. No early myeloid cells or blasts. Mature lymphocytes. Mild decrease in platelets but normal morphology. 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 This would include B12 deficiency, myelodysplastic syndrome, leukemia/lymphoma.  Bone marrow aspiration and biopsy done yesterday with assistance of interventional radiology. Preliminary findings show advanced megaloblastic changes but no increased blasts excluding acute leukemia. Complete report to follow including chromosome analysis.  She has been on long-term Celexa and Lamictal. I would consider that these are causing a toxic effect on her bone marrow despite the "normal" B12 level.  Recommendation: While we are waiting for the final results on the bone marrow and chromosome analysis, which may take another 1-2 weeks to  return, I do not think it would harm her to begin an empiric trial of parenteral B12 1 mg daily 1 week, then 1 mg weekly 1 month, then 1 mg monthly. These recommendations may change based on final results of the bone marrow. I have not had a chance to discuss these preliminary results with the patient yet.  I would like to wait until we have final results to avoid any anxiety or confusion in this fragile patient.
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
## 2015-02-21 NOTE — Progress Notes (Signed)
CRITICAL VALUE ALERT  Critical value received:  hemoglobin  Date of notification:  02/21/2015  Time of notification:  7218  Critical value read back: yes  Nurse who received alert:  Aggie Cosier RN  MD notified (1st page):  Lamar Blinks NP  Time of first page:  [phone]  MD notified (2nd page):  Time of second page:  Responding MD:  Lamar Blinks NP  Time MD responded:  [phone]   Orders placed to obtain blood consent form and transfuse RBCs

## 2015-02-21 NOTE — Progress Notes (Signed)
PATIENT DETAILS Name: Jordan [name] Age: 49 y.o. Sex: female Date of Birth: [birth year] Admit Date: 02/18/2015 Admitting Physician Annita Brod, MD PCP:Pcp Not In System  Subjective: Very anxious this am. Still complains of fatigue  Assessment/Plan: Principal Problem:   Sepsis due to Gram negative bacteria (MDR E Coli): Recent urine culture positive for ESBL Escherichia coli, continue imipenem.Repeat urine culture 4/30 positive for ESBL Ecoli , blood culture on 4/30 negative so far. Remains afebrile-however continues to be leukopenic. Sepsis pathophysiology has resolved.   Active Problems:   ESBL Escherichia coli UTI: Last urine culture on 4/12 positive for ESBL Escherichia coli, however patient left AGAINST MEDICAL ADVICE. Continue imipenem,Repeat urine culture 4/30 also positive for ESBL E Coli. Will aim for 7-10 days of treatment     Pancytopenia: HIV, hepatitis profile negative. Vitamin B12 borderline, folate levels within normal limits. Because of anxiety/sedation issue she refused a bone marrow biopsy as an outpatient, she finally agreed and underwent a Bone marrow bx on 02/20/15.  Dr Beryle Beams reviewed bone marrow biopsy, which showed advanced megaloblastic changes. Recommendations are to start parenteral vitamin B12 supplementation. Await official bone marrow biopsy report. However since hemoglobin down to 6.8, will go and transfuse 1 unit of PRBC. No role for Neupogen per Dr Beryle Beams. Since some suspicion for psychiatric medication induced pancytopenia,consulted psychiatry and made major changes to her medications-see below    Chest pain: Very atypical, cardiac enzymes negative. Doubt further workup required at this time.    Bipolar disorder: Since some suspicion for psychiatric medication induced pancytopenia,consulted psychiatry and made major changes to her medications-Lamictal, lithium and Celexa have been discontinued-patient now has been started on  Seroquel, Paxil.continues to have anxiety-Will follow      Hypokalemia: Resolved, check periodically     Morbid obesity: Counseled.  Disposition: Remain inpatient-home when pancytopenia better.   Antimicrobial agents  See below  Anti-infectives    Start     Dose/Rate Route Frequency Ordered Stop   02/18/15 1200  imipenem-cilastatin (PRIMAXIN) 500 mg in sodium chloride 0.9 % 100 mL IVPB     500 mg 200 mL/hr over 30 Minutes Intravenous 4 times per day 02/18/15 1144        DVT Prophylaxis: SCD's   Code Status: Full code   Family Communication Boyfriend at bedside  Procedures: Bone Marrow bx 5/2  CONSULTS:  Interventional radiology   Hematology-Dr Granfortuna  Time spent 25 minutes-which includes 50% of the time with face-to-face with patient/ family and coordinating care with Hematology  related to the above assessment and plan.  MEDICATIONS: Scheduled Meds: . sodium chloride   Intravenous Once  . sodium chloride   Intravenous Once  . acetaminophen  650 mg Oral Once  . cyanocobalamin  1 mg Intramuscular Daily  . diphenhydrAMINE  25 mg Intravenous Once  . folic acid  1 mg Oral Daily  . imipenem-cilastatin  500 mg Intravenous 4 times per day  . multivitamin with minerals  1 tablet Oral Daily  . PARoxetine  25 mg Oral Daily  . QUEtiapine  50 mg Oral TID  . sodium chloride  10-40 mL Intracatheter Q12H  . sodium chloride  3 mL Intravenous Q12H  . thiamine  100 mg Oral Daily   Continuous Infusions:  PRN Meds:.acetaminophen **OR** acetaminophen, ALPRAZolam, alum & mag hydroxide-simeth, ondansetron **OR** ondansetron (ZOFRAN) IV, sodium chloride    PHYSICAL EXAM: Vital signs in last 24 hours: [employer]  Vitals:   02/21/15 0925 02/21/15 1054 02/21/15 1125 02/21/15 1253  BP: 105/80 121/71    Pulse: 91 96    Temp: 98.2 F (36.8 C) 98.5 F (36.9 C) 98.2 F (36.8 C) 98.9 F (37.2 C)  TempSrc: Oral Oral Oral Oral  Resp: 18 22    Height:      Weight:      SpO2:  91% 94%      Weight change: -1.6 kg (-3 lb 8.4 oz) Filed Weights   02/18/15 0733 02/20/15 0346 02/21/15 0400  Weight: 144.244 kg (318 lb) 145.6 kg (320 lb 15.8 oz) 144 kg (317 lb 7.4 oz)   Body mass index is 48.28 kg/(m^2).   Gen Exam: Awake and alert with clear speech. Remains anxious Neck: Supple, No JVD.   Chest: B/L Clear.  No rhonchi CVS: S1 S2 Regular, no murmurs.  Abdomen: soft, BS +, non tender, non distended.  Extremities: no edema, lower extremities warm to touch. Neurologic: Non Focal.   Skin: No Rash.   Wounds: N/A.    Intake/Output from previous day:  Intake/Output Summary (Last 24 hours) at 02/21/15 1416 Last data filed at 02/21/15 1053  Gross per 24 hour  Intake    463 ml  Output   1075 ml  Net   -612 ml     LAB RESULTS: CBC  Recent Labs Lab 02/18/15 0840 02/19/15 0533 02/20/15 0525 02/21/15 0410  WBC 1.5* 1.3* 1.3* 1.1*  HGB 9.4* 7.4* 7.0* 6.8*  HCT 27.5* 21.9* 20.5* 19.8*  PLT 89* 75* 90* 68*  MCV 103.0* 104.3* 105.1* 102.6*  MCH 35.2* 35.2* 35.9* 35.2*  MCHC 34.2 33.8 34.1 34.3  RDW 26.1* 26.9* 26.9* 26.8*  LYMPHSABS 0.6*  --   --  0.7  MONOABS 0.0*  --   --  0.0*  EOSABS 0.0  --   --  0.0  BASOSABS 0.0  --   --  0.0    Chemistries   Recent Labs Lab 02/18/15 0840 02/19/15 0533 02/20/15 0525  NA 143 140 140  K 3.5 3.4* 3.7  CL 107 112* 111  CO2 24 22 23   GLUCOSE 93 89 90  BUN 10 8 <5*  CREATININE 0.75 0.50 0.57  CALCIUM 9.3 7.9* 8.4*    CBG:  Recent Labs Lab 02/18/15 1712 02/20/15 2115  GLUCAP 99 91    GFR Estimated Creatinine Clearance: 133 mL/min (by C-G formula based on Cr of 0.57).  Coagulation profile  Recent Labs Lab 02/18/15 1334  INR 1.04    Cardiac Enzymes  Recent Labs Lab 02/18/15 1655 02/18/15 2149 02/19/15 0534  TROPONINI <0.03 <0.03 <0.03    Invalid input(s): POCBNP No results for input(s): DDIMER in the last 72 hours. No results for input(s): HGBA1C in the last 72 hours. No results  for input(s): CHOL, HDL, LDLCALC, TRIG, CHOLHDL, LDLDIRECT in the last 72 hours. No results for input(s): TSH, T4TOTAL, T3FREE, THYROIDAB in the last 72 hours.  Invalid input(s): FREET3 No results for input(s): VITAMINB12, FOLATE, FERRITIN, TIBC, IRON, RETICCTPCT in the last 72 hours. No results for input(s): LIPASE, AMYLASE in the last 72 hours.  Urine Studies No results for input(s): UHGB, CRYS in the last 72 hours.  Invalid input(s): UACOL, UAPR, USPG, UPH, UTP, UGL, UKET, UBIL, UNIT, UROB, ULEU, UEPI, UWBC, URBC, UBAC, CAST, UCOM, BILUA  MICROBIOLOGY: Recent Results (from the past 240 hour(s))  Urine culture     Status: None   Collection Time: 02/18/15 12:08 PM  Result Value  Ref Range Status   Specimen Description URINE, CLEAN CATCH  Final   Special Requests NONE  Final   Colony Count   Final    >=100,000 COLONIES/ML Performed at [facility]    Culture   Final    ESCHERICHIA COLI Note: Confirmed Extended Spectrum Beta-Lactamase Producer (ESBL) Note: CRITICAL RESULT CALLED TO, READ BACK BY AND VERIFIED WITH: WHITNEY 5/2 AT 1605 BY DUNNJ Performed at [facility]    Report Status 02/20/2015 FINAL  Final   Organism ID, Bacteria ESCHERICHIA COLI  Final      Susceptibility   Escherichia coli - MIC*    AMPICILLIN >=32 RESISTANT Resistant     CEFAZOLIN >=64 RESISTANT Resistant     CEFTRIAXONE >=64 RESISTANT Resistant     CIPROFLOXACIN >=4 RESISTANT Resistant     GENTAMICIN <=1 SENSITIVE Sensitive     LEVOFLOXACIN >=8 RESISTANT Resistant     NITROFURANTOIN <=16 SENSITIVE Sensitive     TOBRAMYCIN >=16 RESISTANT Resistant     TRIMETH/SULFA >=320 RESISTANT Resistant     IMIPENEM <=0.25 SENSITIVE Sensitive     PIP/TAZO 8 SENSITIVE Sensitive     * ESCHERICHIA COLI  Culture, blood (routine x 2)     Status: None (Preliminary result)   Collection Time: 02/18/15  1:30 PM  Result Value Ref Range Status   Specimen Description BLOOD RIGHT ARM  Final   Special  Requests   Final    BOTTLES DRAWN AEROBIC AND ANAEROBIC 10CC BLUE 5CC RED   Culture   Final           BLOOD CULTURE RECEIVED NO GROWTH TO DATE CULTURE WILL BE HELD FOR 5 DAYS BEFORE ISSUING A FINAL NEGATIVE REPORT Performed at [facility]    Report Status PENDING  Incomplete  Culture, blood (routine x 2)     Status: None (Preliminary result)   Collection Time: 02/18/15  1:34 PM  Result Value Ref Range Status   Specimen Description BLOOD RIGHT HAND  Final   Special Requests BOTTLES DRAWN AEROBIC ONLY 10CC  Final   Culture   Final           BLOOD CULTURE RECEIVED NO GROWTH TO DATE CULTURE WILL BE HELD FOR 5 DAYS BEFORE ISSUING A FINAL NEGATIVE REPORT Performed at [facility]    Report Status PENDING  Incomplete  MRSA PCR Screening     Status: None   Collection Time: 02/18/15  2:18 PM  Result Value Ref Range Status   MRSA by PCR NEGATIVE NEGATIVE Final    Comment:        The GeneXpert MRSA Assay (FDA approved for NASAL specimens only), is one component of a comprehensive MRSA colonization surveillance program. It is not intended to diagnose MRSA infection nor to guide or monitor treatment for MRSA infections.   Clostridium Difficile by PCR     Status: None   Collection Time: 02/19/15 10:18 AM  Result Value Ref Range Status   C difficile by pcr NEGATIVE NEGATIVE Final    RADIOLOGY STUDIES/RESULTS: Dg Chest 2 View  01/31/2015   CLINICAL DATA:  Leukemia and fatigue.  EXAM: CHEST  2 VIEW  COMPARISON:  January 04, 2015  FINDINGS: There is no edema or consolidation. Heart size and pulmonary vascularity are normal. No adenopathy. No bone lesions.  IMPRESSION: No edema or consolidation.   Electronically Signed   By: Lowella Grip III M.D.   On: 01/31/2015 16:38   US Abdomen Complete  02/18/2015  CLINICAL DATA:  Abnormal LFTs.  Alcohol abuse.  Cholecystectomy.  EXAM: ULTRASOUND ABDOMEN COMPLETE  COMPARISON:  CT of the abdomen and pelvis 01/06/2015  FINDINGS:  Gallbladder: Status post cholecystectomy.  Common bile duct: Diameter: 4.1 mm  Liver: The liver is echogenic. There is attenuation of the ultrasound wave, poor visualization of the internal hepatic architecture, and loss of definition of the diaphragm. No focal lesions identified.  IVC: No abnormality visualized.  Pancreas: Limited visualization. Visualized portion is normal in appearance.  Spleen: 13.0 cm in length.  Splenic volume is 493.0 cubic cm.  Right Kidney: Length: 11.5 cm. Upper pole cyst is 1.4 x 0.9 x 1.3 cm. No hydronephrosis.  Left Kidney: Length: 10.9 cm . Echogenicity within normal limits. No mass or hydronephrosis visualized.  Abdominal aorta: Visualized portion is not aneurysmal.  Other findings: None.  IMPRESSION: 1. Status postcholecystectomy. 2. Fatty liver. 3. Splenomegaly.   Electronically Signed   By: Nolon Nations M.D.   On: 02/18/2015 13:22   Ct Biopsy  02/20/2015   CLINICAL DATA:  Pancytopenia  EXAM: CT-GUIDED BIOPSY BONE MARROW ASPIRATE AND CORE.  MEDICATIONS AND MEDICAL HISTORY: Versed 2 mg, Fentanyl 100 mcg.  Additional Medications: None.  ANESTHESIA/SEDATION: Moderate sedation time: 34 minutes  PROCEDURE: The procedure, risks, benefits, and alternatives were explained to the patient. Questions regarding the procedure were encouraged and answered. The patient understands and consents to the procedure.  The back was prepped with Betadine in a sterile fashion, and a sterile drape was applied covering the operative field. A sterile gown and sterile gloves were used for the procedure.  Under CT guidance, an 11 gauge needle was inserted into the right iliac crest via posterior approach. Aspirates and a core were obtained. Final imaging was performed.  Patient tolerated the procedure well without complication. Vital sign monitoring by nursing staff during the procedure will continue as patient is in the special procedures unit for post procedure observation.  FINDINGS: The images  document guide needle placement within the right iliac bone. Post biopsy images demonstrate no hemorrhage.  COMPLICATIONS: None  IMPRESSION: Successful CT-guided bone marrow aspirate and core.   Electronically Signed   By: Marybelle Killings M.D.   On: 02/20/2015 14:34   Dg Chest Portable 1 View  02/18/2015   CLINICAL DATA:  One day history of chest pain and shortness of breath  EXAM: PORTABLE CHEST - 1 VIEW  COMPARISON:  January 31, 2015  FINDINGS: Lungs are clear. Heart size and pulmonary vascularity are normal. No adenopathy. No bone lesions. No pneumothorax.  IMPRESSION: No abnormality noted.   Electronically Signed   By: Lowella Grip III M.D.   On: 02/18/2015 08:06    Oren Binet, MD  [facility] Pager:[phone]  If 7PM-7AM, please contact night-coverage [email or web address] Password [email or web address] 02/21/2015, 2:16 PM   LOS: 3 days
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
## 2015-02-22 ENCOUNTER — Encounter (HOSPITAL_COMMUNITY): Payer: Self-pay | Admitting: Oncology

## 2015-02-22 DIAGNOSIS — D531 Other megaloblastic anemias, not elsewhere classified: Secondary | ICD-10-CM

## 2015-02-22 HISTORY — DX: Other megaloblastic anemias, not elsewhere classified: D53.1

## 2015-02-22 LAB — CBC WITH DIFFERENTIAL/PLATELET
Basophils Absolute: 0 10*3/uL (ref 0.0–0.1)
Basophils Relative: 0 % (ref 0–1)
Eosinophils Absolute: 0 10*3/uL (ref 0.0–0.7)
Eosinophils Relative: 0 % (ref 0–5)
HCT: 22.5 % — ABNORMAL LOW (ref 36.0–46.0)
Hemoglobin: 7.7 g/dL — ABNORMAL LOW (ref 12.0–15.0)
Lymphocytes Relative: 52 % — ABNORMAL HIGH (ref 12–46)
Lymphs Abs: 0.7 10*3/uL (ref 0.7–4.0)
MCH: 34.1 pg — ABNORMAL HIGH (ref 26.0–34.0)
MCHC: 34.2 g/dL (ref 30.0–36.0)
MCV: 99.6 fL (ref 78.0–100.0)
Monocytes Absolute: 0 10*3/uL — ABNORMAL LOW (ref 0.1–1.0)
Monocytes Relative: 3 % (ref 3–12)
Neutro Abs: 0.5 10*3/uL — ABNORMAL LOW (ref 1.7–7.7)
Neutrophils Relative %: 45 % (ref 43–77)
Platelets: 68 10*3/uL — ABNORMAL LOW (ref 150–400)
RBC: 2.26 MIL/uL — ABNORMAL LOW (ref 3.87–5.11)
WBC: 1.2 10*3/uL — CL (ref 4.0–10.5)

## 2015-02-22 LAB — PATHOLOGIST SMEAR REVIEW

## 2015-02-22 MED ORDER — QUETIAPINE FUMARATE 50 MG PO TABS
100.0000 mg | ORAL_TABLET | Freq: Every day | ORAL | Status: DC
  Administered 2015-02-22: 100 mg via ORAL
  Filled 2015-02-22: qty 2

## 2015-02-22 MED ORDER — CYANOCOBALAMIN 1000 MCG/ML IJ SOLN
1.0000 mg | Freq: Every day | INTRAMUSCULAR | Status: DC
  Administered 2015-02-23: 1000 ug via SUBCUTANEOUS
  Filled 2015-02-22: qty 1

## 2015-02-22 NOTE — Evaluation (Addendum)
Physical Therapy Evaluation Patient Details Name: Jordan [name] MRN: [id number] DOB: 1966-06-10 Today's Date: 02/22/2015   History of Present Illness  Pt is a 49 y.o. Female with PMH morbid obesity, bipolar disorder and anemia. She was admitted with UTI and sepsis due to Gram negative bacteria (MDR E Coli).  Clinical Impression  Pt admitted with above diagnosis. Pt currently with functional limitations due to the deficits listed below (see PT Problem List). Mobility limited by dizziness and anxiety. Min guard assist needed for sit to stand. Pt unable to progress past bedside static standing due to fear of falling. Pt encouraged to sit EOB for meals or transfer to recliner with nursing assist. Pt will benefit from skilled PT to increase their independence and safety with mobility to allow discharge to the venue listed below.  Pt's desire is to return home with her boyfriend. Recommending RW, BSC, and w/c. Pt has concerns regarding insurance coverage for equipment.     Follow Up Recommendations Home health PT;Supervision/Assistance - 24 hour    Equipment Recommendations  Rolling walker with 5" wheels;Wheelchair (measurements PT);Wheelchair cushion (measurements PT);3in1 (PT)    Recommendations for Other Services       Precautions / Restrictions Precautions Precautions: Fall Restrictions Weight Bearing Restrictions: No      Mobility  Bed Mobility Overal bed mobility: Needs Assistance Bed Mobility: Supine to Sit;Sit to Supine     Supine to sit: Supervision Sit to supine: Supervision   General bed mobility comments: verbal cues/encouragement needed to complete  Transfers Overall transfer level: Needs assistance Equipment used: None Transfers: Sit to/from Stand Sit to stand: Min guard A (x 4 trials)         General transfer comment: Pt very fearful of falling. Stance appeared steady with controled sit back to EOB; however, pt reporting significant dizziness and inability to  transfer to chair or teke steps.  Ambulation/Gait                Stairs            Wheelchair Mobility    Modified Rankin (Stroke Patients Only)       Balance Overall balance assessment: Needs assistance Sitting-balance support: No upper extremity supported;Feet supported Sitting balance-Leahy Scale: Good     Standing balance support: No upper extremity supported   Standing balance comment: Good static standing balance                             Pertinent Vitals/Pain Pain Assessment: No/denies pain    Home Living Family/patient expects to be discharged to:: Other (Comment) [employer] ) Living Arrangements: Spouse/significant other                    Prior Function Level of Independence: Independent         Comments: Pt is normally able to get up to [facility] without assistance but is perseverating on fear of falling due to dizziness.      Hand Dominance        Extremity/Trunk Assessment   Upper Extremity Assessment: Generalized weakness           Lower Extremity Assessment: Generalized weakness      Cervical / Trunk Assessment: Normal  Communication   Communication: No difficulties  Cognition Arousal/Alertness: Awake/alert Behavior During Therapy: Anxious Overall Cognitive Status: History of cognitive impairments - at baseline  General Comments      Exercises        Assessment/Plan    PT Assessment Patient needs continued PT services  PT Diagnosis Difficulty walking;Generalized weakness   PT Problem List Decreased strength;Decreased activity tolerance;Decreased mobility;Decreased cognition  PT Treatment Interventions DME instruction;Gait training;Functional mobility training;Therapeutic activities;Therapeutic exercise;Patient/family education;Balance training;Wheelchair mobility training   PT Goals (Current goals can be found in the Care Plan section) Acute Rehab PT Goals Patient Stated  Goal: home with her boyfriend PT Goal Formulation: With patient Time For Goal Achievement: 03/08/15 Potential to Achieve Goals: Good    Frequency Min 3X/week   Barriers to discharge        Co-evaluation               End of Session   Activity Tolerance: Treatment limited secondary to medical complications (Comment) (anxiety) Patient left: in bed;with family/visitor present;with call bell/phone within reach Nurse Communication: Mobility status         Time: 5465-6812 PT Time Calculation (min) (ACUTE ONLY): 25 min   Charges:   PT Evaluation $Initial PT Evaluation Tier I: 1 Procedure PT Treatments $Therapeutic Activity: 8-22 mins   PT G Codes:        Lorriane Shire 02/22/2015, 10:26 AM

## 2015-02-22 NOTE — Care Management Note (Addendum)
Case Management Note  Patient Details  Name: Jordan [name] MRN: [id number] Date of Birth: 08/31/1966  Subjective/Objective:   Pt is admitted with bipolar disorder, abdominal pain, shortness of breath, and chest pain   Action/Plan:  CM will continue to monitor for disposition needs.  CM contacted MD to ask for recommendations of PT orders   Expected Discharge Date:  02/24/15               Expected Discharge Plan:  [place]  In-House Referral:     Discharge planning Services  CM Consult  Post Acute Care Choice:    Choice offered to:  Patient  DME Arranged:  Walker rolling, 3-N-1, Estate manager/land agent) DME Agency:  [place]:  PT, RN [place] Agency:  [place]  Status of Service:  In process, will continue to follow  Medicare Important Message Given:  No Date Medicare IM Given:    Medicare IM give by:    Date Additional Medicare IM Given:    Additional Medicare Important Message give by:     If discussed at [place] of Stay Meetings, dates discussed:    Additional Comments: 02/22/15;  CM requested MD to write orders as deemed necessary per PT recommendations.  CM offered choice to pt regarding HH.  Pt chose [facility] for both [facility] and DME.  CM verified home address and phone number in EPIC with patient.  CM contacted both [place] and [facility], both referrals were accepted, however per pt insurance pt does not qualify for PT (comunicated with MD).  Per pt and boyfriend;  boyfriend will provide 24 hour supervision as recommended by PT.  CM requested MD to write orders for PT recommendations, MD acknowledged request and will place orders.  CM offered choice to pt CM contacted Pt PCP office MD Jinny Blossom [phone], CM made pt appointment with MD Tamela Oddi 02/28/15 at 2:45pm.  Pt has seen this physisican before in additon to MD [place] for [facility] Medication Assistance.  Per  physician office; pt can receive blood draws as deemed necessary at the office.   Office requested that prior to discharge, discharge summary faxed to office that details required follow ups including blood draws, fax with attention to Tamela Oddi [phone], CM placed sticky note under physician comments in Epic regarding fax.  CM informed pt of scheduled appointment.  Place appointment information on AVS.  No additional CM needs at this time.    Maryclare Labrador, RN 02/22/2015, 11:03 AM

## 2015-02-22 NOTE — Progress Notes (Signed)
Pt able to sit on side of bed for 30 minutes without difficulty, unable to transfer to chair due to anxiety about falling, stated she would try later, resting quietly in bed in sitting position at present will continue to monitor closely.

## 2015-02-22 NOTE — Progress Notes (Signed)
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  I would continue B12 for now.  Follow up chromosome studies as outpatient. Can call Susette Racer at [phone] to schedule appointment in my Heme clinic But can see primary care in interim to check blood counts.  It will take a few weeks to see response to B12 & to get chromosomes back.

## 2015-02-22 NOTE — Progress Notes (Signed)
Patient ID: Jordan [name], female   DOB: [birth year], 49 y.o.   MRN: [id number] Hematology Clinically stable. I would not give further blood transfusions. Continue B12; monitor response Reticulocytes should start to come up at 3 weeks - Hemoglobin rise will follow. I suspect Lamictal was the offending drug. Status discussed with patient and boyfriend. Her PCP should be able to continue the B12 shots after discharge.
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
## 2015-02-22 NOTE — Progress Notes (Signed)
PATIENT DETAILS Name: Jordan [name] Age: 49 y.o. Sex: female Date of Birth: 05-19-1966 Admit Date: 02/18/2015 Admitting Physician Annita Brod, MD PCP:Pcp Not In System  Subjective: Patient continues to be very anxious and worried.    Assessment/Plan: Principal Problem:  Sepsis due to ESBL Escherichia coli UTI: Recent urine culture positive for ESBL Escherichia coli, continue imipenem.Repeat urine culture 4/30 positive for ESBL Ecoli , blood culture on 4/30 negative so far. Remains afebrile-however continues to be leukopenic with a wbc count of 1.2 today(02/22/15).    Active Problems:  Pancytopenia: HIV, hepatitis profile negative. Vitamin B12 borderline, folate levels within normal limits. Because of anxiety/sedation issue she refused a bone marrow biopsy as an outpatient, she finally agreed and underwent a Bone marrow bx on 02/20/15. Dr Beryle Beams reviewed bone marrow biopsy, which showed advanced megaloblastic changes. Recommendations are to start parenteral vitamin B12 supplementation. Await official bone marrow biopsy report.Has been transfused 1 unit of PRBC so far. No role for Neupogen per Dr Beryle Beams.  Hemoglobin is 7.7 today(02/22/15).  Since some suspicion for psychiatric medication induced pancytopenia,consulted psychiatry and made major changes to her medications-see below.  Continue with current plan of B12 shots and psychiatric changes allowing time for marrow stimulation.  Follow.     Chest pain: Very atypical, cardiac enzymes negative. No further workup needed at this time, patient complains of no chest pain or related symptoms.     Bipolar disorder: Since some suspicion for psychiatric medication induced pancytopenia,consulted psychiatry and made major changes to her medications-Lamictal, lithium and Celexa have been discontinued-patient now has been started on Seroquel, Paxil.  Patient continues to have anxiety, but this is to be expected in her current  hospitalized state.  Follow, and give new meds time to be effective.     Hypokalemia: Resolved.  Potassium of 3.7 on 5/2/20216.  Monitor periodically.    Morbid Obesity:  Patient has been counseled on the issue.  Hopefully she will be more active upon discharge.     Disposition: Remain inpatient until blood counts normalize.    Antimicrobial agents  See below  Anti-infectives    Start     Dose/Rate Route Frequency Ordered Stop   02/18/15 1200  imipenem-cilastatin (PRIMAXIN) 500 mg in sodium chloride 0.9 % 100 mL IVPB     500 mg 200 mL/hr over 30 Minutes Intravenous 4 times per day 02/18/15 1144        DVT Prophylaxis:  SCD's  Code Status: Full code   Family Communication Boyfriend was at bedside, and plan was discussed with him and patient.    Procedures: Bone marrow biopsy on 5/2 B12 shots  CONSULTS:  hematology and  psychiatry  Time spent 20 minutes-which includes 50% of the time with face-to-face with patient/ family and coordinating care related to the above assessment and plan.  MEDICATIONS: Scheduled Meds: . sodium chloride   Intravenous Once  . sodium chloride   Intravenous Once  . acetaminophen  650 mg Oral Once  . cyanocobalamin  1 mg Intramuscular Daily  . diphenhydrAMINE  25 mg Intravenous Once  . folic acid  1 mg Oral Daily  . imipenem-cilastatin  500 mg Intravenous 4 times per day  . multivitamin with minerals  1 tablet Oral Daily  . PARoxetine  25 mg Oral Daily  . QUEtiapine  50 mg Oral BID  . sodium chloride  10-40 mL Intracatheter Q12H  . sodium chloride  3 mL Intravenous Q12H  . thiamine  100 mg Oral Daily   Continuous Infusions:  PRN Meds:.acetaminophen **OR** acetaminophen, ALPRAZolam, alum & mag hydroxide-simeth, ondansetron **OR** ondansetron (ZOFRAN) IV, sodium chloride    PHYSICAL EXAM: Vital signs in last 24 hours: Filed Vitals:   02/21/15 1545 02/21/15 2000 02/22/15 0000 02/22/15 0500  BP: 108/56 107/77 98/52 106/60  Pulse:  96 96 88 90  Temp: 98.2 F (36.8 C) 98.1 F (36.7 C) 97.9 F (36.6 C) 99 F (37.2 C)  TempSrc: Oral Oral Oral Oral  Resp: _0 Height:      Weight:    141.205 kg (311 lb 4.8 oz)  SpO2: 100% 98% 98% 98%    Weight change: -2.795 kg (-6 lb 2.6 oz) Filed Weights   02/20/15 0346 02/21/15 0400 02/22/15 0500  Weight: 145.6 kg (320 lb 15.8 oz) 144 kg (317 lb 7.4 oz) 141.205 kg (311 lb 4.8 oz)   Body mass index is 47.34 kg/(m^2).   Gen Exam: Sitting up in bed alert and oriented.  She is still very anxious.  Neck: Supple, No JVD.   Chest: B/L Clear.  BL air movement.   CVS: S1 S2 Regular, no murmurs.  Abdomen: soft, BS +, non tender, non distended. No ascites. Extremities: no edema, lower extremities warm to touch Neurologic: Non Focal.   Skin: No visible rash or lesions.  Wounds: None   Intake/Output from previous day:  Intake/Output Summary (Last 24 hours) at 02/22/15 1035 Last data filed at 02/22/15 0511  Gross per 24 hour  Intake    900 ml  Output    600 ml  Net    300 ml     LAB RESULTS: CBC  Recent Labs Lab 02/18/15 0840 02/19/15 0533 02/20/15 0525 02/21/15 0410 02/21/15 1440 02/22/15 0500  WBC 1.5* 1.3* 1.3* 1.1* 1.5* 1.2*  HGB 9.4* 7.4* 7.0* 6.8* 7.8* 7.7*  HCT 27.5* 21.9* 20.5* 19.8* 22.6* 22.5*  PLT 89* 75* 90* 68* 70* 68*  MCV 103.0* 104.3* 105.1* 102.6* 97.8 99.6  MCH 35.2* 35.2* 35.9* 35.2* 33.8 34.1*  MCHC 34.2 33.8 34.1 34.3 34.5 34.2  RDW 26.1* 26.9* 26.9* 26.8* NOT CALCULATED NOT CALCULATED  LYMPHSABS 0.6*  --   --  0.7  --  0.7  MONOABS 0.0*  --   --  0.0*  --  0.0*  EOSABS 0.0  --   --  0.0  --  0.0  BASOSABS 0.0  --   --  0.0  --  0.0    Chemistries   Recent Labs Lab 02/18/15 0840 02/19/15 0533 02/20/15 0525  NA 143 140 140  K 3.5 3.4* 3.7  CL 107 112* 111  CO2 _1 GLUCOSE 93 89 90  BUN 10 8 <5*  CREATININE 0.75 0.50 0.57  CALCIUM 9.3 7.9* 8.4*    CBG:  Recent Labs Lab 02/18/15 1712 02/20/15 2115  GLUCAP  99 91    GFR Estimated Creatinine Clearance: 131.5 mL/min (by C-G formula based on Cr of 0.57).  Coagulation profile  Recent Labs Lab 02/18/15 1334  INR 1.04    Cardiac Enzymes  Recent Labs Lab 02/18/15 1655 02/18/15 2149 02/19/15 0534  TROPONINI <0.03 <0.03 <0.03    Invalid input(s): POCBNP No results for input(s): DDIMER in the last 72 hours. No results for input(s): HGBA1C in the last 72 hours. No results for input(s): CHOL, HDL, LDLCALC, TRIG, CHOLHDL, LDLDIRECT in the last 72 hours. No results for  input(s): TSH, T4TOTAL, T3FREE, THYROIDAB in the last 72 hours.  Invalid input(s): FREET3 No results for input(s): VITAMINB12, FOLATE, FERRITIN, TIBC, IRON, RETICCTPCT in the last 72 hours. No results for input(s): LIPASE, AMYLASE in the last 72 hours.  Urine Studies No results for input(s): UHGB, CRYS in the last 72 hours.  Invalid input(s): UACOL, UAPR, USPG, UPH, UTP, UGL, UKET, UBIL, UNIT, UROB, ULEU, UEPI, UWBC, URBC, UBAC, CAST, UCOM, BILUA  MICROBIOLOGY: Recent Results (from the past 240 hour(s))  Urine culture     Status: None   Collection Time: 02/18/15 12:08 PM  Result Value Ref Range Status   Specimen Description URINE, CLEAN CATCH  Final   Special Requests NONE  Final   Colony Count   Final    >=100,000 COLONIES/ML Performed at [facility]    Culture   Final    ESCHERICHIA COLI Note: Confirmed Extended Spectrum Beta-Lactamase Producer (ESBL) Note: CRITICAL RESULT CALLED TO, READ BACK BY AND VERIFIED WITH: WHITNEY 5/2 AT 1605 BY DUNNJ Performed at [facility]    Report Status 02/20/2015 FINAL  Final   Organism ID, Bacteria ESCHERICHIA COLI  Final      Susceptibility   Escherichia coli - MIC*    AMPICILLIN >=32 RESISTANT Resistant     CEFAZOLIN >=64 RESISTANT Resistant     CEFTRIAXONE >=64 RESISTANT Resistant     CIPROFLOXACIN >=4 RESISTANT Resistant     GENTAMICIN <=1 SENSITIVE Sensitive     LEVOFLOXACIN >=8 RESISTANT  Resistant     NITROFURANTOIN <=16 SENSITIVE Sensitive     TOBRAMYCIN >=16 RESISTANT Resistant     TRIMETH/SULFA >=320 RESISTANT Resistant     IMIPENEM <=0.25 SENSITIVE Sensitive     PIP/TAZO 8 SENSITIVE Sensitive     * ESCHERICHIA COLI  Culture, blood (routine x 2)     Status: None (Preliminary result)   Collection Time: 02/18/15  1:30 PM  Result Value Ref Range Status   Specimen Description BLOOD RIGHT ARM  Final   Special Requests   Final    BOTTLES DRAWN AEROBIC AND ANAEROBIC 10CC BLUE 5CC RED   Culture   Final           BLOOD CULTURE RECEIVED NO GROWTH TO DATE CULTURE WILL BE HELD FOR 5 DAYS BEFORE ISSUING A FINAL NEGATIVE REPORT Performed at [facility]    Report Status PENDING  Incomplete  Culture, blood (routine x 2)     Status: None (Preliminary result)   Collection Time: 02/18/15  1:34 PM  Result Value Ref Range Status   Specimen Description BLOOD RIGHT HAND  Final   Special Requests BOTTLES DRAWN AEROBIC ONLY 10CC  Final   Culture   Final    GRAM POSITIVE RODS Note: Gram Stain Report Called to,Read Back By and Verified With: [facility] K@ 0258 ON [id number] BY [facility] Performed at [facility]    Report Status PENDING  Incomplete  MRSA PCR Screening     Status: None   Collection Time: 02/18/15  2:18 PM  Result Value Ref Range Status   MRSA by PCR NEGATIVE NEGATIVE Final    Comment:        The GeneXpert MRSA Assay (FDA approved for NASAL specimens only), is one component of a comprehensive MRSA colonization surveillance program. It is not intended to diagnose MRSA infection nor to guide or monitor treatment for MRSA infections.   Clostridium Difficile by PCR     Status: None   Collection Time: 02/19/15 10:18 AM  Result Value Ref Range Status   C difficile by pcr NEGATIVE NEGATIVE Final    RADIOLOGY STUDIES/RESULTS: Dg Chest 2 View  01/31/2015   CLINICAL DATA:  Leukemia and fatigue.  EXAM: CHEST  2 VIEW  COMPARISON:  January 04, 2015  FINDINGS: There  is no edema or consolidation. Heart size and pulmonary vascularity are normal. No adenopathy. No bone lesions.  IMPRESSION: No edema or consolidation.   Electronically Signed   By: Lowella Grip III M.D.   On: 01/31/2015 16:38   US Abdomen Complete  02/18/2015   CLINICAL DATA:  Abnormal LFTs.  Alcohol abuse.  Cholecystectomy.  EXAM: ULTRASOUND ABDOMEN COMPLETE  COMPARISON:  CT of the abdomen and pelvis 01/06/2015  FINDINGS: Gallbladder: Status post cholecystectomy.  Common bile duct: Diameter: 4.1 mm  Liver: The liver is echogenic. There is attenuation of the ultrasound wave, poor visualization of the internal hepatic architecture, and loss of definition of the diaphragm. No focal lesions identified.  IVC: No abnormality visualized.  Pancreas: Limited visualization. Visualized portion is normal in appearance.  Spleen: 13.0 cm in length.  Splenic volume is 493.0 cubic cm.  Right Kidney: Length: 11.5 cm. Upper pole cyst is 1.4 x 0.9 x 1.3 cm. No hydronephrosis.  Left Kidney: Length: 10.9 cm . Echogenicity within normal limits. No mass or hydronephrosis visualized.  Abdominal aorta: Visualized portion is not aneurysmal.  Other findings: None.  IMPRESSION: 1. Status postcholecystectomy. 2. Fatty liver. 3. Splenomegaly.   Electronically Signed   By: Nolon Nations M.D.   On: 02/18/2015 13:22   Ct Biopsy  02/20/2015   CLINICAL DATA:  Pancytopenia  EXAM: CT-GUIDED BIOPSY BONE MARROW ASPIRATE AND CORE.  MEDICATIONS AND MEDICAL HISTORY: Versed 2 mg, Fentanyl 100 mcg.  Additional Medications: None.  ANESTHESIA/SEDATION: Moderate sedation time: 34 minutes  PROCEDURE: The procedure, risks, benefits, and alternatives were explained to the patient. Questions regarding the procedure were encouraged and answered. The patient understands and consents to the procedure.  The back was prepped with Betadine in a sterile fashion, and a sterile drape was applied covering the operative field. A sterile gown and sterile gloves  were used for the procedure.  Under CT guidance, an 11 gauge needle was inserted into the right iliac crest via posterior approach. Aspirates and a core were obtained. Final imaging was performed.  Patient tolerated the procedure well without complication. Vital sign monitoring by nursing staff during the procedure will continue as patient is in the special procedures unit for post procedure observation.  FINDINGS: The images document guide needle placement within the right iliac bone. Post biopsy images demonstrate no hemorrhage.  COMPLICATIONS: None  IMPRESSION: Successful CT-guided bone marrow aspirate and core.   Electronically Signed   By: Marybelle Killings M.D.   On: 02/20/2015 14:34   Dg Chest Portable 1 View  02/18/2015   CLINICAL DATA:  One day history of chest pain and shortness of breath  EXAM: PORTABLE CHEST - 1 VIEW  COMPARISON:  January 31, 2015  FINDINGS: Lungs are clear. Heart size and pulmonary vascularity are normal. No adenopathy. No bone lesions. No pneumothorax.  IMPRESSION: No abnormality noted.   Electronically Signed   By: Lowella Grip III M.D.   On: 02/18/2015 08:06    Marlou Starks, [place] PA-S  [facility] Pager:[phone]  If 7PM-7AM, please contact night-coverage [email or web address] Password [email or web address] 02/22/2015, 10:35 AM   LOS: 4 days   Attending Patient was seen, examined,treatment plan was discussed with the  [facility]  Provider.  I have directly reviewed the clinical findings, lab, imaging studies and management of this patient in detail. I have made the necessary changes to the above noted documentation, and agree with the documentation, as recorded by the [facility] Provider.   Doing well, appreciate hematology assistance-started on vitamin B12 supplementation. CBC still shows pancytopenia but seems to have stabilized. Suspect if things continue to be stable-should be able to be discharged on 5/5. We'll try and schedule patient for follow-up with  hematology.  Nena Alexander MD [facility].

## 2015-02-23 DIAGNOSIS — D531 Other megaloblastic anemias, not elsewhere classified: Secondary | ICD-10-CM

## 2015-02-23 LAB — CBC WITH DIFFERENTIAL/PLATELET
Band Neutrophils: 0 % (ref 0–10)
Basophils Absolute: 0 10*3/uL (ref 0.0–0.1)
Basophils Relative: 0 % (ref 0–1)
Blasts: 0 %
Eosinophils Absolute: 0 10*3/uL (ref 0.0–0.7)
Eosinophils Relative: 0 % (ref 0–5)
HCT: 21.4 % — ABNORMAL LOW (ref 36.0–46.0)
Hemoglobin: 7.3 g/dL — ABNORMAL LOW (ref 12.0–15.0)
Lymphocytes Relative: 0 % — ABNORMAL LOW (ref 12–46)
Lymphs Abs: 0 10*3/uL — ABNORMAL LOW (ref 0.7–4.0)
MCH: 34.1 pg — ABNORMAL HIGH (ref 26.0–34.0)
MCHC: 34.1 g/dL (ref 30.0–36.0)
MCV: 100 fL (ref 78.0–100.0)
Metamyelocytes Relative: 0 %
Monocytes Absolute: 0 10*3/uL — ABNORMAL LOW (ref 0.1–1.0)
Monocytes Relative: 0 % — ABNORMAL LOW (ref 3–12)
Myelocytes: 0 %
Neutro Abs: 0 10*3/uL — ABNORMAL LOW (ref 1.7–7.7)
Neutrophils Relative %: 0 % — ABNORMAL LOW (ref 43–77)
Other: 0 %
Platelets: 74 10*3/uL — ABNORMAL LOW (ref 150–400)
Promyelocytes Absolute: 0 %
RBC: 2.14 MIL/uL — ABNORMAL LOW (ref 3.87–5.11)
WBC: 1.3 10*3/uL — CL (ref 4.0–10.5)

## 2015-02-23 LAB — TYPE AND SCREEN
ABO/RH(D): A NEG
Antibody Screen: NEGATIVE
Unit division: 0
Unit division: 0

## 2015-02-23 MED ORDER — QUETIAPINE FUMARATE 100 MG PO TABS: 100.0000 mg | ORAL_TABLET | Freq: Every day | ORAL | Status: DC

## 2015-02-23 MED ORDER — THIAMINE HCL 100 MG PO TABS: 100.0000 mg | ORAL_TABLET | Freq: Every day | ORAL | Status: DC

## 2015-02-23 MED ORDER — CYANOCOBALAMIN 1000 MCG/ML IJ SOLN: INTRAMUSCULAR | Status: DC

## 2015-02-23 MED ORDER — FOLIC ACID 1 MG PO TABS: 1.0000 mg | ORAL_TABLET | Freq: Every day | ORAL | Status: DC

## 2015-02-23 MED ORDER — PAROXETINE HCL ER 25 MG PO TB24
25.0000 mg | ORAL_TABLET | Freq: Every day | ORAL | Status: DC
Start: 2015-02-23 — End: 2015-12-28

## 2015-02-23 MED ORDER — ALPRAZOLAM 0.5 MG PO TABS: 0.5000 mg | ORAL_TABLET | Freq: Three times a day (TID) | ORAL | Status: DC | PRN

## 2015-02-23 NOTE — Progress Notes (Signed)
Physical Therapy Treatment Patient Details Name: Jordan [name] MRN: [id number] DOB: [birth year] Today's Date: 02/23/2015    History of Present Illness Pt is a 49 y.o. Female with PMH morbid obesity, bipolar disorder and anemia. She was admitted with UTI and sepsis due to Gram negative bacteria (MDR E Coli).    PT Comments    Patient finally able to pivot with walker from recliner to bed. Took increased time and encouragement due to fear/anxiety and complaints of dizziness.  She plans to d/c today back to hotel, so discussed with case manager and equipment rep for pt to get wheelchair at hospital to allow entry into hotel room.  Also educated no follow up PT coverage so to direct questions about mobility to [facility].  Follow Up Recommendations  Supervision/Assistance - 24 hour;No PT follow up     Equipment Recommendations  Rolling walker with 5" wheels;Wheelchair (measurements PT);Wheelchair cushion (measurements PT);3in1 (PT)    Recommendations for Other Services       Precautions / Restrictions Precautions Precautions: Fall Precaution Comments: anxiety Restrictions Weight Bearing Restrictions: No    Mobility  Bed Mobility           Sit to supine: HOB elevated;Modified independent (Device/Increase time)   General bed mobility comments: would not return to flat bed, asked to elevate HOB  Transfers Overall transfer level: Needs assistance Equipment used: Rolling walker (2 wheeled) Transfers: Sit to/from [facility] Sit to Stand: Min assist;+2 safety/equipment Stand pivot transfers: Min assist;+2 physical assistance       General transfer comment: increased time, multiple attempt with rocking back to chair, pt in recliner lower surface, did not want assist, but finally up with assist of two to walker, to pivot to chair, pt placing her arm over my shoulder rather than using walker.  Continued c/o dizziness and difficulty with rising from low surface of  recliner  Ambulation/Gait                 Stairs            Wheelchair Mobility    Modified Rankin (Stroke Patients Only)       Balance     Sitting balance-Leahy Scale: Good Sitting balance - Comments: fatigued quickly trying to put shoes on sitting leaning over, needed assist to get shoes on                            Cognition Arousal/Alertness: Awake/alert Behavior During Therapy: Anxious Overall Cognitive Status: History of cognitive impairments - at baseline                      Exercises      General Comments General comments (skin integrity, edema, etc.): attempted at first to get pt to wheelchair to show boyfriend how to bump chair up on step for entry into hotel room, but pt only finally agreed to go to bed due to having to lay flat to get PICC line out; therefore demonstrated with empty wheelchair how to bump wheelchair up one step      Pertinent Vitals/Pain Pain Assessment: No/denies pain    Home Living                      Prior Function            PT Goals (current goals can now be found in the care plan section) Progress towards PT goals: Not  progressing toward goals - comment (due to anxiety)    Frequency  Min 3X/week    PT Plan Discharge plan needs to be updated    Co-evaluation             End of Session   Activity Tolerance: Other (comment) (limited by anxiety) Patient left: with call bell/phone within reach;in bed;with family/visitor present     Time: 1120-1203 PT Time Calculation (min) (ACUTE ONLY): 43 min  Charges:  $Therapeutic Activity: 8-22 mins $Self Care/Home Management: 23-37                    G Codes:      [name],Jordan 02/28/2015, 12:39 PM  Jordan [name], [place] Feb 28, 2015

## 2015-02-23 NOTE — Care Management Note (Signed)
CARE MANAGEMENT NOTE 02/23/2015  Patient:  Jordan [name], Jordan [name]   Account Number:  [id number]  Date Initiated:  02/22/2015  Documentation initiated by:  Elenor Quinones  Subjective/Objective Assessment:   Pt adimited with bipolar disorder, abdominal pain, shortness of breath and chest pain     Action/Plan:   CM will continue to monitor for disposition needs.  CM contacted MD to ask for PT recommendation orders   Anticipated DC Date:  02/23/2015   Anticipated DC Plan:  [place]  CM consult      Choice offered to / List presented to:  C-1 Patient   DME arranged  [place]  3-N-1  [place]  IV PUMP/EQUIPMENT      DME agency  [place] arranged  HH-1 RN      [place].   Status of service:  In process, will continue to follow Medicare Important Message given?  NO (If response is "NO", the following Medicare IM given date fields will be blank) Date Medicare IM given:   Medicare IM given by:   Date Additional Medicare IM given:   Additional Medicare IM given by:    Discharge Disposition:  [place]  Per UR Regulation:    If discussed at Long Length of Stay Meetings, dates discussed:    Comments:  02/22/14 Elenor Quinones RN BSN [phone] . CM was informed by MD that pt will no longer go home on IV antibotics.  Per pt request; wheelchair will be delivered to pts room prior to discharge today.  CM contacted [place] and informed that IV realted referral is no longer valid.  No additonal CM needs at this time.   02/23/15 Elenor Quinones RN BSN [phone].  MD informed CM that pt would discharge possible today   with IV antibotics.  CM contacted [facility] and provided inital referral, inital referral was accepted, [facility] awiaitng MD orders in epic to complete IV equipment order.    02/22/15 Elenor Quinones RN BSN [phone].  CM requested MD to write orders as deemed necessary per PT recommendations.  CM offered choice to pt regarding HH.  Pt chose [facility] for both [facility] and DME.  CM verified home address and phone number in EPIC with patient.  CM contacted both [place] and [place] [facility], both referrals were accepted, however per pt insurance pt does not qualify for PT (comunicated with MD).  Per pt and boyfriend;  boyfriend will provide 24 hour supervision as recommended by PT.  CM requested MD to write orders for PT recommendations, MD acknowledged request and will place orders. CM offered choice to pt CM contacted Pt PCP office MD Jinny Blossom [phone], CM made pt appointment with MD Tamela Oddi 02/28/15 at 2:45pm.  Pt has seen this physisican before in additon to MD [place] for [facility] Medication Assistance.  Per physician office; pt can receive blood draws as deemed necessary at the office.   Office requested that prior to discharge, discharge summary faxed to office that details required follow ups including blood draws, fax with attention to Tamela Oddi [phone], CM placed sticky note under physician comments in Epic regarding fax.  CM informed pt of scheduled appointment.  Place appointment information on AVS.  No additional CM needs at this time.

## 2015-02-23 NOTE — Discharge Summary (Signed)
PATIENT DETAILS Name: Jordan [name] Age: 49 y.o. Sex: female Date of Birth: 02-Aug-1966 MRN: [id number]. Admitting Physician: Annita Brod, MD CNO:[id number] N., FNP  Admit Date: 02/18/2015 Discharge date: 02/23/2015  Recommendations for Outpatient Follow-up:  1. Check CBC in 1 week. 2. Psych medications have been adjusted-see below.  PRIMARY DISCHARGE DIAGNOSIS:  Principal Problem:   Bipolar disorder Active Problems:   MDR E. coli UTI   Pancytopenia   Sepsis due to Gram negative bacteria (MDR E Coli)   Transaminitis   Chest pain   Sepsis   Generalized anxiety disorder   Claustrophobia   Megaloblastic anemia      PAST MEDICAL HISTORY: Past Medical History  Diagnosis Date  . Mental disorder   . Bipolar 1 disorder   . Bipolar 1 disorder   . Yeast infection of the vagina   . Bipolar disorder   . Obesity   . UTI (lower urinary tract infection)   . Alcohol abuse   . Shortness of breath dyspnea   . Anemia   . 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 Qty: 30 tablet, Refills: 0    cyanocobalamin (,VITAMIN B-12,) 1000 MCG/ML injection Inject subcutaneously q Daily till 02/27/15,then, Inject subcutaneously q weekly for 4 weeks from 02/28/15, Then from 03/29/15-inject subcutaneously q monthly and stay on it Qty: 30 mL, Refills: 0    folic acid (FOLVITE) 1 MG tablet Take 1 tablet (1 mg total) by mouth daily. Qty: 30 tablet, Refills: 0    PARoxetine (PAXIL-CR) 25 MG 24 hr tablet Take 1 tablet (25 mg total) by mouth daily. Qty: 30 tablet, Refills: 0    QUEtiapine (SEROQUEL) 100 MG tablet Take 1 tablet (100 mg total) by mouth at bedtime. Qty: 30 tablet, Refills: 0    thiamine 100 MG tablet Take 1 tablet (100 mg total) by mouth  daily. Qty: 30 tablet, Refills: 0      STOP taking these medications     citalopram (CELEXA) 20 MG tablet      estrogens, conjugated, (PREMARIN) 0.625 MG tablet      gemfibrozil (LOPID) 600 MG tablet      lamoTRIgine (LAMICTAL) 100 MG tablet      LITHIUM PO         ALLERGIES:   Allergies  Allergen Reactions  . Tramadol Anaphylaxis, Shortness Of Breath and Swelling  . Sulfa Antibiotics Swelling  . Citalopram     Possible cause of pancytopenia  . Lamotrigine     Possible cause of pancytopenia    BRIEF HPI:  See H&P, Labs, Consult and Test reports for all details in brief, patient is a 28 female patient with significant past medical history of bipolar disorder with past history of alcoholism although she quit 7 years ago, pancytopenia admitted for evaluation of fatigue, Shortness of breath and lower abd pain.  CONSULTATIONS:   hematology/oncology and IR  PERTINENT RADIOLOGIC STUDIES: Dg Chest 2 View  01/31/2015   CLINICAL DATA:  Leukemia and fatigue.  EXAM: CHEST  2 VIEW  COMPARISON:  January 04, 2015  FINDINGS: There is no edema or consolidation. Heart size and pulmonary vascularity are normal. No adenopathy. No bone lesions.  IMPRESSION: No edema or consolidation.   Electronically Signed   By:  Lowella Grip III M.D.   On: 01/31/2015 16:38   US Abdomen Complete  02/18/2015   CLINICAL DATA:  Abnormal LFTs.  Alcohol abuse.  Cholecystectomy.  EXAM: ULTRASOUND ABDOMEN COMPLETE  COMPARISON:  CT of the abdomen and pelvis 01/06/2015  FINDINGS: Gallbladder: Status post cholecystectomy.  Common bile duct: Diameter: 4.1 mm  Liver: The liver is echogenic. There is attenuation of the ultrasound wave, poor visualization of the internal hepatic architecture, and loss of definition of the diaphragm. No focal lesions identified.  IVC: No abnormality visualized.  Pancreas: Limited visualization. Visualized portion is normal in appearance.  Spleen: 13.0 cm in length.  Splenic volume is 493.0  cubic cm.  Right Kidney: Length: 11.5 cm. Upper pole cyst is 1.4 x 0.9 x 1.3 cm. No hydronephrosis.  Left Kidney: Length: 10.9 cm . Echogenicity within normal limits. No mass or hydronephrosis visualized.  Abdominal aorta: Visualized portion is not aneurysmal.  Other findings: None.  IMPRESSION: 1. Status postcholecystectomy. 2. Fatty liver. 3. Splenomegaly.   Electronically Signed   By: Nolon Nations M.D.   On: 02/18/2015 13:22   Ct Biopsy  02/20/2015   CLINICAL DATA:  Pancytopenia  EXAM: CT-GUIDED BIOPSY BONE MARROW ASPIRATE AND CORE.  MEDICATIONS AND MEDICAL HISTORY: Versed 2 mg, Fentanyl 100 mcg.  Additional Medications: None.  ANESTHESIA/SEDATION: Moderate sedation time: 34 minutes  PROCEDURE: The procedure, risks, benefits, and alternatives were explained to the patient. Questions regarding the procedure were encouraged and answered. The patient understands and consents to the procedure.  The back was prepped with Betadine in a sterile fashion, and a sterile drape was applied covering the operative field. A sterile gown and sterile gloves were used for the procedure.  Under CT guidance, an 11 gauge needle was inserted into the right iliac crest via posterior approach. Aspirates and a core were obtained. Final imaging was performed.  Patient tolerated the procedure well without complication. Vital sign monitoring by nursing staff during the procedure will continue as patient is in the special procedures unit for post procedure observation.  FINDINGS: The images document guide needle placement within the right iliac bone. Post biopsy images demonstrate no hemorrhage.  COMPLICATIONS: None  IMPRESSION: Successful CT-guided bone marrow aspirate and core.   Electronically Signed   By: Marybelle Killings M.D.   On: 02/20/2015 14:34   Dg Chest Portable 1 View  02/18/2015   CLINICAL DATA:  One day history of chest pain and shortness of breath  EXAM: PORTABLE CHEST - 1 VIEW  COMPARISON:  January 31, 2015  FINDINGS:  Lungs are clear. Heart size and pulmonary vascularity are normal. No adenopathy. No bone lesions. No pneumothorax.  IMPRESSION: No abnormality noted.   Electronically Signed   By: Lowella Grip III M.D.   On: 02/18/2015 08:06     PERTINENT LAB RESULTS: CBC:  Recent Labs  02/22/15 0500 02/23/15 0525  WBC 1.2* 1.3*  HGB 7.7* 7.3*  HCT 22.5* 21.4*  PLT 68* 74*   CMET CMP     Component Value Date/Time   NA 140 02/20/2015 0525   K 3.7 02/20/2015 0525   CL 111 02/20/2015 0525   CO2 23 02/20/2015 0525   GLUCOSE 90 02/20/2015 0525   BUN <5* 02/20/2015 0525   CREATININE 0.57 02/20/2015 0525   CALCIUM 8.4* 02/20/2015 0525   PROT 4.9* 02/20/2015 0525   ALBUMIN 3.1* 02/20/2015 0525   AST 40 02/20/2015 0525   ALT 28 02/20/2015 0525   ALKPHOS 60 02/20/2015 0525  BILITOT 2.0* 02/20/2015 0525   GFRNONAA >60 02/20/2015 0525   GFRAA >60 02/20/2015 0525    GFR Estimated Creatinine Clearance: 132.1 mL/min (by C-G formula based on Cr of 0.57). No results for input(s): LIPASE, AMYLASE in the last 72 hours. No results for input(s): CKTOTAL, CKMB, CKMBINDEX, TROPONINI in the last 72 hours. Invalid input(s): POCBNP No results for input(s): DDIMER in the last 72 hours. No results for input(s): HGBA1C in the last 72 hours. No results for input(s): CHOL, HDL, LDLCALC, TRIG, CHOLHDL, LDLDIRECT in the last 72 hours. No results for input(s): TSH, T4TOTAL, T3FREE, THYROIDAB in the last 72 hours.  Invalid input(s): FREET3 No results for input(s): VITAMINB12, FOLATE, FERRITIN, TIBC, IRON, RETICCTPCT in the last 72 hours. Coags: No results for input(s): INR in the last 72 hours.  Invalid input(s): PT Microbiology: Recent Results (from the past 240 hour(s))  Urine culture     Status: None   Collection Time: 02/18/15 12:08 PM  Result Value Ref Range Status   Specimen Description URINE, CLEAN CATCH  Final   Special Requests NONE  Final   Colony Count   Final    >=100,000  COLONIES/ML Performed at [facility]    Culture   Final    ESCHERICHIA COLI Note: Confirmed Extended Spectrum Beta-Lactamase Producer (ESBL) Note: CRITICAL RESULT CALLED TO, READ BACK BY AND VERIFIED WITH: WHITNEY 5/2 AT 1605 BY DUNNJ Performed at [facility]    Report Status 02/20/2015 FINAL  Final   Organism ID, Bacteria ESCHERICHIA COLI  Final      Susceptibility   Escherichia coli - MIC*    AMPICILLIN >=32 RESISTANT Resistant     CEFAZOLIN >=64 RESISTANT Resistant     CEFTRIAXONE >=64 RESISTANT Resistant     CIPROFLOXACIN >=4 RESISTANT Resistant     GENTAMICIN <=1 SENSITIVE Sensitive     LEVOFLOXACIN >=8 RESISTANT Resistant     NITROFURANTOIN <=16 SENSITIVE Sensitive     TOBRAMYCIN >=16 RESISTANT Resistant     TRIMETH/SULFA >=320 RESISTANT Resistant     IMIPENEM <=0.25 SENSITIVE Sensitive     PIP/TAZO 8 SENSITIVE Sensitive     * ESCHERICHIA COLI  Culture, blood (routine x 2)     Status: None (Preliminary result)   Collection Time: 02/18/15  1:30 PM  Result Value Ref Range Status   Specimen Description BLOOD RIGHT ARM  Final   Special Requests   Final    BOTTLES DRAWN AEROBIC AND ANAEROBIC 10CC BLUE 5CC RED   Culture   Final           BLOOD CULTURE RECEIVED NO GROWTH TO DATE CULTURE WILL BE HELD FOR 5 DAYS BEFORE ISSUING A FINAL NEGATIVE REPORT Performed at [facility]    Report Status PENDING  Incomplete  Culture, blood (routine x 2)     Status: None (Preliminary result)   Collection Time: 02/18/15  1:34 PM  Result Value Ref Range Status   Specimen Description BLOOD RIGHT HAND  Final   Special Requests BOTTLES DRAWN AEROBIC ONLY 10CC  Final   Culture   Final    GRAM POSITIVE RODS Note: Gram Stain Report Called to,Read Back By and Verified With: [facility] K@ [id number] ON [id number] BY [facility] Performed at [facility]    Report Status PENDING  Incomplete  MRSA PCR Screening     Status: None   Collection Time: 02/18/15  2:18 PM  Result Value  Ref Range Status   MRSA by PCR NEGATIVE NEGATIVE Final  Comment:        The GeneXpert MRSA Assay (FDA approved for NASAL specimens only), is one component of a comprehensive MRSA colonization surveillance program. It is not intended to diagnose MRSA infection nor to guide or monitor treatment for MRSA infections.   Clostridium Difficile by PCR     Status: None   Collection Time: 02/19/15 10:18 AM  Result Value Ref Range Status   C difficile by pcr NEGATIVE NEGATIVE Final     BRIEF HOSPITAL COURSE:  Sepsis due to ESBL Escherichia coli UTI: Recent urine culture positive for ESBL Escherichia coli, continue imipenem.Repeat urine culture 4/30 positive for ESBL Ecoli , blood culture on 4/30 negative so far. This MD spoke with Dr Megan Salon of the [facility] service over the phone, who recommended no further Abx treatment on discharge.   Active Problems:  Pancytopenia: HIV, hepatitis profile negative. Vitamin B12 borderline, folate levels within normal limits. Because of anxiety/sedation issue she refused a bone marrow biopsy as an outpatient, she finally agreed and underwent a Bone marrow bx on 02/20/15. Dr Beryle Beams reviewed bone marrow biopsy, which showed advanced megaloblastic changes. Recommendations are to continue parenteral vitamin B12 supplementation. Official bone marrow biopsy report still pending at time of discharge.Has been transfused 1 unit of PRBC so far. No role for Neupogen per Dr Beryle Beams.  Since some suspicion for psychiatric medication induced pancytopenia,consulted psychiatry and made major changes to her medications-see below. Continue with current plan of B12 shots, have contacted Dr Azucena Freed office, they will contact patient for a follow up appointment. Patient will need very close monitoring of CBC as outpatient.    Chest pain: Very atypical, cardiac enzymes negative. No further workup needed at this time, patient complains of no chest pain or related symptoms.     Bipolar disorder: Since some suspicion for psychiatric medication induced pancytopenia,consulted psychiatry and made major changes to her medications-Lamictal, lithium and Celexa have been discontinued-patient now has been started on Seroquel, Paxil. Patient has been asked to follow up with her psychiatrist on discharge.    Hypokalemia: Resolved.    Morbid Obesity: Patient has been counseled on the issue.     TODAY-DAY OF DISCHARGE:  Subjective:   Jaslyne Beeck today has no headache,no chest/ abdominal pain,no new weakness tingling or numbness, feels much better wants to go home today.   Objective:   Blood pressure 112/95, pulse 97, temperature 98.5 F (36.9 C), temperature source Oral, resp. rate 18, height 5' 8"  (1.727 m), weight 142.112 kg (313 lb 4.8 oz), last menstrual period 09/13/2014, SpO2 100 %.  Intake/Output Summary (Last 24 hours) at 02/23/15 0959 Last data filed at 02/22/15 2000  Gross per 24 hour  Intake      0 ml  Output    200 ml  Net   -200 ml   Filed Weights   02/21/15 0400 02/22/15 0500 02/23/15 0402  Weight: 144 kg (317 lb 7.4 oz) 141.205 kg (311 lb 4.8 oz) 142.112 kg (313 lb 4.8 oz)    Exam Awake Alert, Oriented *3, No new F.N deficits, Normal affect [place].AT,PERRAL Supple Neck,No JVD, No cervical lymphadenopathy appriciated.  Symmetrical Chest wall movement, Good air movement bilaterally, CTAB RRR,No Gallops,Rubs or new Murmurs, No Parasternal Heave +ve B.Sounds, Abd Soft, Non tender, No organomegaly appriciated, No rebound -guarding or rigidity. No Cyanosis, Clubbing or edema, No new Rash or bruise  DISCHARGE CONDITION: Stable  DISPOSITION: Home with home health services  DISCHARGE INSTRUCTIONS:    Activity:  As tolerated with Full  fall precautions use walker/cane & assistance as needed  Diet recommendation: Heart Healthy diet  Discharge Instructions    Call MD for:  difficulty breathing, headache or visual disturbances    Complete  by:  As directed      Call MD for:  severe uncontrolled pain    Complete by:  As directed      Diet general    Complete by:  As directed      Increase activity slowly    Complete by:  As directed            Follow-up Information    Follow up with Dr Tamela Oddi with [facility]. Go on 02/28/2015.   Why:  Follow Up Appointment 02/27/14 at 2:45 pm   Contact information:   Dr, Tamela Oddi [address] [place] [address] phone: [phone] fax: [phone]      Follow up with [facility].   Why:  Medical Equipment; Conservation officer, nature, manual wheelchair and cushion   Contact information:   [place] [address] [phone]       Follow up with [facility].   Why:  Registered Nurse   Contact information:   [address] [place] [address] [phone]       Follow up with Annia Belt, MD.   Specialty:  Oncology   Why:  office will call you with a appointment.    Contact information:   [place] [address] [phone]       Total Time spent on discharge equals  45 minutes.  SignedOren Binet 02/23/2015 9:59 AM

## 2015-02-23 NOTE — Progress Notes (Signed)
Pt able to transfer to chair with walker and standby assist, needs small amount of coaching, eating breakfast with no complaints of discomfort or anxiety at present.
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
## 2015-02-23 NOTE — Progress Notes (Signed)
Pt discharged via wheelchair, condition stable, accompanied by boyfriend, discharge education complete.

## 2015-02-23 NOTE — Care Management Note (Signed)
CARE MANAGEMENT NOTE 02/23/2015  Patient:  Jordan [name], Jordan [name]   Account Number:  [id number]  Date Initiated:  02/22/2015  Documentation initiated by:  Elenor Quinones  Subjective/Objective Assessment:   Pt adimited with bipolar disorder, abdominal pain, shortness of breath and chest pain     Action/Plan:   CM will continue to monitor for disposition needs.  CM contacted MD to ask for PT recommendation orders   Anticipated DC Date:  02/23/2015   Anticipated DC Plan:  [place]  CM consult      Choice offered to / List presented to:  C-1 Patient   DME arranged  [place]  3-N-1  [place]  IV PUMP/EQUIPMENT      DME agency  [place] arranged  HH-1 RN      [place].   Status of service:  In process, will continue to follow Medicare Important Message given?  NO (If response is "NO", the following Medicare IM given date fields will be blank) Date Medicare IM given:   Medicare IM given by:   Date Additional Medicare IM given:   Additional Medicare IM given by:    Discharge Disposition:  [place]  Per UR Regulation:    If discussed at Long Length of Stay Meetings, dates discussed:    Comments:  02/23/15 Elenor Quinones RN BSN [phone].  MD informed CM that pt would discharge possible today   with IV antibotics.  CM contacted [facility] and provided inital referral, inital referral was accepted, [facility] awiaitng MD orders in epic to complete IV equipment order.    02/22/15 Elenor Quinones RN BSN [phone].  CM requested MD to write orders as deemed necessary per PT recommendations.  CM offered choice to pt regarding HH.  Pt chose [facility] for both [facility] and DME.  CM verified home address and phone number in EPIC with patient.  CM contacted both [facility], both  referrals were accepted, however per pt insurance pt does not qualify for PT (comunicated with MD).  Per pt and boyfriend;  boyfriend will provide 24 hour supervision as recommended by PT.  CM requested MD to write orders for PT recommendations, MD acknowledged request and will place orders. CM offered choice to pt CM contacted Pt PCP office MD Jinny Blossom [phone], CM made pt appointment with MD Tamela Oddi 02/28/15 at 2:45pm.  Pt has seen this physisican before in additon to MD [place] for [facility] Medication Assistance.  Per physician office; pt can receive blood draws as deemed necessary at the office.   Office requested that prior to discharge, discharge summary faxed to office that details required follow ups including blood draws, fax with attention to Tamela Oddi [phone], CM placed sticky note under physician comments in Epic regarding fax.  CM informed pt of scheduled appointment.  Place appointment information on AVS.  No additional CM needs at this time.

## 2015-02-23 NOTE — Progress Notes (Signed)
Pt and boyfriend educated on administration of B12 injection sub Q, with return demonstration, pt and boyfriend verbalized understanding of administration and universal precautions.

## 2015-02-24 LAB — CULTURE, BLOOD (ROUTINE X 2): Culture: NO GROWTH

## 2015-02-28 LAB — CHROMOSOME ANALYSIS, BONE MARROW

## 2015-03-02 ENCOUNTER — Other Ambulatory Visit (HOSPITAL_COMMUNITY): Payer: Self-pay

## 2015-03-02 ENCOUNTER — Inpatient Hospital Stay (HOSPITAL_COMMUNITY)
Admission: EM | Admit: 2015-03-02 | Discharge: 2015-03-05 | DRG: 194 | Discharge status: Against Medical Advice | Payer: Medicaid Other | Attending: Internal Medicine | Admitting: Internal Medicine

## 2015-03-02 ENCOUNTER — Encounter (HOSPITAL_COMMUNITY): Payer: Self-pay

## 2015-03-02 ENCOUNTER — Observation Stay (HOSPITAL_COMMUNITY): Payer: Medicaid Other

## 2015-03-02 DIAGNOSIS — B9629 Other Escherichia coli [E. coli] as the cause of diseases classified elsewhere: Secondary | ICD-10-CM | POA: Diagnosis present

## 2015-03-02 DIAGNOSIS — R197 Diarrhea, unspecified: Secondary | ICD-10-CM

## 2015-03-02 DIAGNOSIS — Z9049 Acquired absence of other specified parts of digestive tract: Secondary | ICD-10-CM | POA: Diagnosis present

## 2015-03-02 DIAGNOSIS — R531 Weakness: Secondary | ICD-10-CM

## 2015-03-02 DIAGNOSIS — Y95 Nosocomial condition: Secondary | ICD-10-CM | POA: Diagnosis present

## 2015-03-02 DIAGNOSIS — R0602 Shortness of breath: Secondary | ICD-10-CM

## 2015-03-02 DIAGNOSIS — E876 Hypokalemia: Secondary | ICD-10-CM | POA: Diagnosis present

## 2015-03-02 DIAGNOSIS — Z09 Encounter for follow-up examination after completed treatment for conditions other than malignant neoplasm: Secondary | ICD-10-CM

## 2015-03-02 DIAGNOSIS — E538 Deficiency of other specified B group vitamins: Secondary | ICD-10-CM | POA: Diagnosis present

## 2015-03-02 DIAGNOSIS — N390 Urinary tract infection, site not specified: Secondary | ICD-10-CM | POA: Diagnosis present

## 2015-03-02 DIAGNOSIS — F319 Bipolar disorder, unspecified: Secondary | ICD-10-CM | POA: Diagnosis present

## 2015-03-02 DIAGNOSIS — Z79899 Other long term (current) drug therapy: Secondary | ICD-10-CM

## 2015-03-02 DIAGNOSIS — J189 Pneumonia, unspecified organism: Principal | ICD-10-CM | POA: Diagnosis present

## 2015-03-02 LAB — URINALYSIS, ROUTINE W REFLEX MICROSCOPIC
Glucose, UA: NEGATIVE mg/dL
Ketones, ur: 15 mg/dL — AB
Nitrite: POSITIVE — AB
Protein, ur: 30 mg/dL — AB
Specific Gravity, Urine: 1.021 (ref 1.005–1.030)
Urobilinogen, UA: 4 mg/dL — ABNORMAL HIGH (ref 0.0–1.0)
pH: 6 (ref 5.0–8.0)

## 2015-03-02 LAB — COMPREHENSIVE METABOLIC PANEL
ALT: 31 U/L (ref 14–54)
AST: 104 U/L — ABNORMAL HIGH (ref 15–41)
Albumin: 3.5 g/dL (ref 3.5–5.0)
Alkaline Phosphatase: 93 U/L (ref 38–126)
Anion gap: 11 (ref 5–15)
BUN: 9 mg/dL (ref 6–20)
CO2: 23 mmol/L (ref 22–32)
Calcium: 8.6 mg/dL — ABNORMAL LOW (ref 8.9–10.3)
Chloride: 105 mmol/L (ref 101–111)
Creatinine, Ser: 0.48 mg/dL (ref 0.44–1.00)
GFR calc Af Amer: >60 mL/min (ref >60)
GFR calc non Af Amer: >60 mL/min (ref >60)
Glucose, Bld: 95 mg/dL (ref 65–99)
Potassium: 4.6 mmol/L (ref 3.5–5.1)
Sodium: 139 mmol/L (ref 135–145)
Total Bilirubin: 4.4 mg/dL — ABNORMAL HIGH (ref 0.3–1.2)
Total Protein: 5.8 g/dL — ABNORMAL LOW (ref 6.5–8.1)

## 2015-03-02 LAB — CBC WITH DIFFERENTIAL/PLATELET
Basophils Absolute: 0 10*3/uL (ref 0.0–0.1)
Basophils Relative: 0 % (ref 0–1)
Eosinophils Absolute: 0.1 10*3/uL (ref 0.0–0.7)
Eosinophils Relative: 1 % (ref 0–5)
HCT: 35 % — ABNORMAL LOW (ref 36.0–46.0)
Hemoglobin: 11.4 g/dL — ABNORMAL LOW (ref 12.0–15.0)
Lymphocytes Relative: 18 % (ref 12–46)
Lymphs Abs: 1.1 10*3/uL (ref 0.7–4.0)
MCH: 32.4 pg (ref 26.0–34.0)
MCHC: 32.6 g/dL (ref 30.0–36.0)
MCV: 99.4 fL (ref 78.0–100.0)
Monocytes Absolute: 0.4 10*3/uL (ref 0.1–1.0)
Monocytes Relative: 6 % (ref 3–12)
Neutro Abs: 4.7 10*3/uL (ref 1.7–7.7)
Neutrophils Relative %: 75 % (ref 43–77)
RBC: 3.52 MIL/uL — ABNORMAL LOW (ref 3.87–5.11)
RDW: 26.2 % — ABNORMAL HIGH (ref 11.5–15.5)
WBC: 6.3 10*3/uL (ref 4.0–10.5)

## 2015-03-02 LAB — URINE MICROSCOPIC-ADD ON

## 2015-03-02 LAB — LIPASE, BLOOD: Lipase: 17 U/L — ABNORMAL LOW (ref 22–51)

## 2015-03-02 MED ORDER — SODIUM CHLORIDE 0.9 % IV SOLN
INTRAVENOUS | Status: DC
  Administered 2015-03-02: via INTRAVENOUS

## 2015-03-02 NOTE — ED Notes (Signed)
Per EMS, Pt from home c/o weakness, abdominal pain, lightheadedness x 9 months. Pt has been treated multiple times for the same. Two weeks ago she was dx with UT two weeks ago and [facility] ED. No changes since. Denies N/V, SOB. Pt was on bedside commode when EMS arrived and refused pants. Pt has hx of bipolar.

## 2015-03-02 NOTE — ED Notes (Signed)
Unable to collect labs at this time patient would not sit back and was yelling.

## 2015-03-02 NOTE — ED Provider Notes (Signed)
CSN: [id number]     Arrival date & time 03/02/15  1734 History   First MD Initiated Contact with Patient 03/02/15 1921     Chief Complaint  Patient presents with  . Weakness  . Abdominal Pain     (Consider location/radiation/quality/duration/timing/severity/associated sxs/prior Treatment) HPI  Pt presenting with c/o generalized weakness, nausea, diarrhea.  She states that since being discharged from the hospital last week- she has been having worsening weakness.  She states she felt improved when on the antibiotics in the hospital.  She has also been having watery diarrhea- she states she has not been able to eat, because each time she eats she has watery diarrhea.  No blood in stool.  No fever.  No vomiting.  Per chart review she was diagnosed with ESBL UTI on urine culture 4/30- was treated with invanz and per ID no home abx were recommended. She also had bone marrow biopsy showing megalobastic anemia- pt has been taking B12 injections.  Her lamictal was discontinued due to concern for affect on blood counts.  There are no other associated systemic symptoms, there are no other alleviating or modifying factors.   Past Medical History  Diagnosis Date  . Mental disorder   . Bipolar 1 disorder   . Bipolar 1 disorder   . Yeast infection of the vagina   . Bipolar disorder   . Obesity   . UTI (lower urinary tract infection)   . Alcohol abuse   . Shortness of breath dyspnea   . Anemia   . 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 Cholecystectomy     History reviewed. No pertinent family history. History  Substance Use Topics  . Smoking status: Never Smoker   . Smokeless tobacco: Never Used  . Alcohol Use: No   OB History    Gravida Para Term Preterm AB TAB SAB Ectopic Multiple Living   1 1 1       1      Review of Systems  ROS reviewed and all otherwise negative except for mentioned in HPI   Allergies   Tramadol; Sulfa antibiotics; Citalopram; and Lamotrigine  Home Medications   Prior to Admission medications   Medication Sig Start Date End Date Taking? Authorizing Provider  ALPRAZolam Duanne Moron) 0.5 MG tablet Take 1 tablet (0.5 mg total) by mouth 3 (three) times daily as needed for anxiety. 02/23/15  Yes Shanker Kristeen Mans, MD  folic acid (FOLVITE) 1 MG tablet Take 1 tablet (1 mg total) by mouth daily. 02/23/15  Yes Shanker Kristeen Mans, MD  PARoxetine (PAXIL-CR) 25 MG 24 hr tablet Take 1 tablet (25 mg total) by mouth daily. 02/23/15  Yes Shanker Kristeen Mans, MD  QUEtiapine (SEROQUEL) 100 MG tablet Take 1 tablet (100 mg total) by mouth at bedtime. 02/23/15  Yes Shanker Kristeen Mans, MD  thiamine 100 MG tablet Take 1 tablet (100 mg total) by mouth daily. 02/23/15  Yes Shanker Kristeen Mans, MD  cyanocobalamin (,VITAMIN B-12,) 1000 MCG/ML injection Inject subcutaneously q Daily till 02/27/15,then, Inject subcutaneously q weekly for 4 weeks from 02/28/15, Then from 03/29/15-inject subcutaneously q monthly and stay on it 02/23/15   Jonetta Osgood, MD   BP 91/61 mmHg  Pulse 87  Temp(Src) 98.7 F (37.1 C) (Oral)  Resp 20  SpO2 97%  LMP 09/13/2014  Vitals reviewed Physical Exam  Physical Examination: General appearance - alert, well appearing, and in no distress Mental  status - alert, oriented to person, place, and time Eyes - no conjunctival injection no scleral icterus Mouth - mucous membranes moist, pharynx normal without lesions Chest - clear to auscultation, no wheezes, rales or rhonchi, symmetric air entry Heart - normal rate, regular rhythm, normal S1, S2, no murmurs, rubs, clicks or gallops Abdomen - soft, nontender, nondistended, no masses or organomegaly, nabs Extremities - peripheral pulses normal, no pedal edema, no clubbing or cyanosis Skin - normal coloration and turgor, no rashes Psych- labile affect, cooperative, speaking loudly at times  ED Course  Procedures (including critical care  time)  10:45 PM d/w triad, Dr. Hal Hope for admission pt to go to med/surg bed.  He recommends holding on antibiotics for now and await urine culture.   Labs Review Labs Reviewed  COMPREHENSIVE METABOLIC PANEL - Abnormal; Notable for the following:    Calcium 8.6 (*)    Total Protein 5.8 (*)    AST 104 (*)    Total Bilirubin 4.4 (*)    All other components within normal limits  LIPASE, BLOOD - Abnormal; Notable for the following:    Lipase 17 (*)    All other components within normal limits  URINALYSIS, ROUTINE W REFLEX MICROSCOPIC - Abnormal; Notable for the following:    Color, Urine ORANGE (*)    APPearance TURBID (*)    Hgb urine dipstick MODERATE (*)    Bilirubin Urine MODERATE (*)    Ketones, ur 15 (*)    Protein, ur 30 (*)    Urobilinogen, UA 4.0 (*)    Nitrite POSITIVE (*)    Leukocytes, UA LARGE (*)    All other components within normal limits  URINE MICROSCOPIC-ADD ON - Abnormal; Notable for the following:    Squamous Epithelial / LPF FEW (*)    Bacteria, UA MANY (*)    All other components within normal limits  CBC WITH DIFFERENTIAL/PLATELET - Abnormal; Notable for the following:    RBC 3.52 (*)    Hemoglobin 11.4 (*)    HCT 35.0 (*)    RDW 26.2 (*)    All other components within normal limits  STOOL CULTURE  URINE CULTURE  CBC WITH DIFFERENTIAL/PLATELET  GI PATHOGEN PANEL BY PCR, STOOL    Imaging Review No results found.   EKG Interpretation None      MDM   Final diagnoses:  UTI (lower urinary tract infection)  Weakness  Diarrhea    Pt presenting with c/o continued weakness, decreased appetite, diarrhea.  Per prior records she was treated with invanz for ESBL UTI.  She also had bone marrow biopsy and was found to have megaloblastic anemia.  She is on B12 injections.  Per ID she was not to go home on antibiotics.  In the ED, her hemoglobin is improved.  Urinalysis shows TNTC WBCs, nitrite positive, many bacteria.  Urine culture sent.  Stool  culture and cdif ordered, but patient has not had diarrhea in the ED to obtain sample.  D/w triad about UTI.  Dr. Hal Hope recommended holding abx for now and awaiting urine culture.  Pt to be admitted to med/surg bed.      Alfonzo Beers, MD 03/02/15 [phone]

## 2015-03-03 ENCOUNTER — Inpatient Hospital Stay (HOSPITAL_COMMUNITY): Payer: Medicaid Other

## 2015-03-03 ENCOUNTER — Encounter (HOSPITAL_COMMUNITY): Payer: Self-pay | Admitting: Internal Medicine

## 2015-03-03 DIAGNOSIS — Y95 Nosocomial condition: Secondary | ICD-10-CM | POA: Diagnosis present

## 2015-03-03 DIAGNOSIS — E876 Hypokalemia: Secondary | ICD-10-CM | POA: Diagnosis present

## 2015-03-03 DIAGNOSIS — R531 Weakness: Secondary | ICD-10-CM | POA: Diagnosis present

## 2015-03-03 DIAGNOSIS — F319 Bipolar disorder, unspecified: Secondary | ICD-10-CM | POA: Diagnosis present

## 2015-03-03 DIAGNOSIS — R197 Diarrhea, unspecified: Secondary | ICD-10-CM

## 2015-03-03 DIAGNOSIS — J189 Pneumonia, unspecified organism: Principal | ICD-10-CM

## 2015-03-03 DIAGNOSIS — Z9049 Acquired absence of other specified parts of digestive tract: Secondary | ICD-10-CM | POA: Diagnosis present

## 2015-03-03 DIAGNOSIS — E538 Deficiency of other specified B group vitamins: Secondary | ICD-10-CM | POA: Diagnosis present

## 2015-03-03 DIAGNOSIS — R0600 Dyspnea, unspecified: Secondary | ICD-10-CM

## 2015-03-03 DIAGNOSIS — Z79899 Other long term (current) drug therapy: Secondary | ICD-10-CM | POA: Diagnosis not present

## 2015-03-03 DIAGNOSIS — N390 Urinary tract infection, site not specified: Secondary | ICD-10-CM | POA: Diagnosis present

## 2015-03-03 DIAGNOSIS — B9629 Other Escherichia coli [E. coli] as the cause of diseases classified elsewhere: Secondary | ICD-10-CM | POA: Diagnosis present

## 2015-03-03 LAB — COMPREHENSIVE METABOLIC PANEL
ALT: 23 U/L (ref 14–54)
AST: 62 U/L — ABNORMAL HIGH (ref 15–41)
Albumin: 3 g/dL — ABNORMAL LOW (ref 3.5–5.0)
Alkaline Phosphatase: 76 U/L (ref 38–126)
Anion gap: 9 (ref 5–15)
BUN: 9 mg/dL (ref 6–20)
CO2: 24 mmol/L (ref 22–32)
Calcium: 8.1 mg/dL — ABNORMAL LOW (ref 8.9–10.3)
Chloride: 106 mmol/L (ref 101–111)
Creatinine, Ser: 0.56 mg/dL (ref 0.44–1.00)
GFR calc Af Amer: >60 mL/min (ref >60)
GFR calc non Af Amer: >60 mL/min (ref >60)
Glucose, Bld: 96 mg/dL (ref 65–99)
Potassium: 3.1 mmol/L — ABNORMAL LOW (ref 3.5–5.1)
Sodium: 139 mmol/L (ref 135–145)
Total Bilirubin: 3.7 mg/dL — ABNORMAL HIGH (ref 0.3–1.2)
Total Protein: 5 g/dL — ABNORMAL LOW (ref 6.5–8.1)

## 2015-03-03 LAB — CBC
HCT: 28.8 % — ABNORMAL LOW (ref 36.0–46.0)
Hemoglobin: 9 g/dL — ABNORMAL LOW (ref 12.0–15.0)
MCH: 31.9 pg (ref 26.0–34.0)
MCHC: 31.3 g/dL (ref 30.0–36.0)
MCV: 102.1 fL — ABNORMAL HIGH (ref 78.0–100.0)
Platelets: 278 10*3/uL (ref 150–400)
RBC: 2.82 MIL/uL — ABNORMAL LOW (ref 3.87–5.11)
RDW: 25.6 % — ABNORMAL HIGH (ref 11.5–15.5)
WBC: 5.3 10*3/uL (ref 4.0–10.5)

## 2015-03-03 LAB — HIV ANTIBODY (ROUTINE TESTING W REFLEX): HIV Screen 4th Generation wRfx: NONREACTIVE

## 2015-03-03 LAB — BRAIN NATRIURETIC PEPTIDE: B Natriuretic Peptide: 31.7 pg/mL (ref 0.0–100.0)

## 2015-03-03 LAB — STREP PNEUMONIAE URINARY ANTIGEN: Strep Pneumo Urinary Antigen: NEGATIVE

## 2015-03-03 LAB — MAGNESIUM: Magnesium: 2 mg/dL (ref 1.7–2.4)

## 2015-03-03 LAB — CLOSTRIDIUM DIFFICILE BY PCR: Toxigenic C. Difficile by PCR: NEGATIVE

## 2015-03-03 LAB — PROCALCITONIN: Procalcitonin: 0.18 ng/mL

## 2015-03-03 LAB — TROPONIN I: Troponin I: <0.03 ng/mL (ref <0.031)

## 2015-03-03 LAB — TSH: TSH: 2.829 u[IU]/mL (ref 0.350–4.500)

## 2015-03-03 LAB — D-DIMER, QUANTITATIVE: D-Dimer, Quant: 0.42 ug/mL-FEU (ref 0.00–0.48)

## 2015-03-03 MED ORDER — CEFEPIME HCL 2 G IJ SOLR
2.0000 g | Freq: Three times a day (TID) | INTRAMUSCULAR | Status: DC
  Administered 2015-03-03 – 2015-03-05 (×7): 2 g via INTRAVENOUS
  Filled 2015-03-03 (×8): qty 2

## 2015-03-03 MED ORDER — DEXTROSE 5 % IV SOLN
2.0000 g | Freq: Once | INTRAVENOUS | Status: AC
  Administered 2015-03-03: 2 g via INTRAVENOUS
  Filled 2015-03-03: qty 2

## 2015-03-03 MED ORDER — FOLIC ACID 1 MG PO TABS
1.0000 mg | ORAL_TABLET | Freq: Every day | ORAL | Status: DC
  Administered 2015-03-03 – 2015-03-05 (×3): 1 mg via ORAL
  Filled 2015-03-03 (×3): qty 1

## 2015-03-03 MED ORDER — PAROXETINE HCL ER 25 MG PO TB24
25.0000 mg | ORAL_TABLET | Freq: Every day | ORAL | Status: DC
  Administered 2015-03-03 – 2015-03-05 (×3): 25 mg via ORAL
  Filled 2015-03-03 (×3): qty 1

## 2015-03-03 MED ORDER — VITAMIN B-1 100 MG PO TABS
100.0000 mg | ORAL_TABLET | Freq: Every day | ORAL | Status: DC
  Administered 2015-03-03 – 2015-03-05 (×3): 100 mg via ORAL
  Filled 2015-03-03 (×3): qty 1

## 2015-03-03 MED ORDER — ACETAMINOPHEN 650 MG RE SUPP: 650.0000 mg | Freq: Four times a day (QID) | RECTAL | Status: DC | PRN

## 2015-03-03 MED ORDER — ONDANSETRON HCL 4 MG PO TABS: 4.0000 mg | ORAL_TABLET | Freq: Four times a day (QID) | ORAL | Status: DC | PRN

## 2015-03-03 MED ORDER — VANCOMYCIN HCL 10 G IV SOLR
1500.0000 mg | Freq: Once | INTRAVENOUS | Status: AC
  Administered 2015-03-03: 1500 mg via INTRAVENOUS
  Filled 2015-03-03: qty 1500

## 2015-03-03 MED ORDER — SODIUM CHLORIDE 0.9 % IJ SOLN
3.0000 mL | Freq: Two times a day (BID) | INTRAMUSCULAR | Status: DC
  Administered 2015-03-03 (×3): 3 mL via INTRAVENOUS

## 2015-03-03 MED ORDER — ACETAMINOPHEN 325 MG PO TABS: 650.0000 mg | ORAL_TABLET | Freq: Four times a day (QID) | ORAL | Status: DC | PRN

## 2015-03-03 MED ORDER — ALPRAZOLAM 0.5 MG PO TABS
0.5000 mg | ORAL_TABLET | Freq: Three times a day (TID) | ORAL | Status: DC | PRN
  Administered 2015-03-04: 0.5 mg via ORAL
  Filled 2015-03-03: qty 1

## 2015-03-03 MED ORDER — QUETIAPINE FUMARATE 100 MG PO TABS
100.0000 mg | ORAL_TABLET | Freq: Every day | ORAL | Status: DC
  Administered 2015-03-03 – 2015-03-04 (×3): 100 mg via ORAL
  Filled 2015-03-03 (×4): qty 1

## 2015-03-03 MED ORDER — MAGIC MOUTHWASH W/LIDOCAINE
10.0000 mL | Freq: Four times a day (QID) | ORAL | Status: DC | PRN
  Administered 2015-03-03 – 2015-03-04 (×2): 10 mL via ORAL
  Filled 2015-03-03 (×4): qty 10

## 2015-03-03 MED ORDER — POTASSIUM CHLORIDE CRYS ER 20 MEQ PO TBCR
40.0000 meq | EXTENDED_RELEASE_TABLET | Freq: Once | ORAL | Status: AC
  Administered 2015-03-03: 40 meq via ORAL
  Filled 2015-03-03: qty 2

## 2015-03-03 MED ORDER — CYANOCOBALAMIN 1000 MCG/ML IJ SOLN: 1000.0000 ug | INTRAMUSCULAR | Status: DC

## 2015-03-03 MED ORDER — VANCOMYCIN HCL 10 G IV SOLR
1250.0000 mg | Freq: Three times a day (TID) | INTRAVENOUS | Status: DC
  Administered 2015-03-03 – 2015-03-05 (×7): 1250 mg via INTRAVENOUS
  Filled 2015-03-03 (×8): qty 1250

## 2015-03-03 MED ORDER — ONDANSETRON HCL 4 MG/2ML IJ SOLN: 4.0000 mg | Freq: Four times a day (QID) | INTRAMUSCULAR | Status: DC | PRN

## 2015-03-03 NOTE — Progress Notes (Signed)
ANTIBIOTIC CONSULT NOTE - INITIAL  Pharmacy Consult for Vancomycin, cefepime  Indication: HCAP  Allergies  Allergen Reactions  . Tramadol Anaphylaxis, Shortness Of Breath and Swelling  . Sulfa Antibiotics Swelling  . Citalopram     Possible cause of pancytopenia  . Lamotrigine     Possible cause of pancytopenia    Patient Measurements:   Adjusted Body Weight:   Vital Signs: Temp: 98.4 F (36.9 C) (05/13 0028) Temp Source: Oral (05/13 0028) BP: 116/79 mmHg (05/13 0028) Pulse Rate: 84 (05/13 0028) Intake/Output from previous day:   Intake/Output from this shift:    Labs:  Recent Labs  03/02/15 1932 03/02/15 2044  WBC  --  6.3  HGB  --  11.4*  PLT  --  REPEATED TO VERIFY  CREATININE 0.48  --    Estimated Creatinine Clearance: 132.1 mL/min (by C-G formula based on Cr of 0.48). No results for input(s): VANCOTROUGH, VANCOPEAK, VANCORANDOM, GENTTROUGH, GENTPEAK, GENTRANDOM, TOBRATROUGH, TOBRAPEAK, TOBRARND, AMIKACINPEAK, AMIKACINTROU, AMIKACIN in the last 72 hours.   Microbiology: Recent Results (from the past 720 hour(s))  Urine culture     Status: None   Collection Time: 02/18/15 12:08 PM  Result Value Ref Range Status   Specimen Description URINE, CLEAN CATCH  Final   Special Requests NONE  Final   Colony Count   Final    >=100,000 COLONIES/ML Performed at [facility]    Culture   Final    ESCHERICHIA COLI Note: Confirmed Extended Spectrum Beta-Lactamase Producer (ESBL) Note: CRITICAL RESULT CALLED TO, READ BACK BY AND VERIFIED WITH: WHITNEY 5/2 AT 1605 BY DUNNJ Performed at [facility]    Report Status 02/20/2015 FINAL  Final   Organism ID, Bacteria ESCHERICHIA COLI  Final      Susceptibility   Escherichia coli - MIC*    AMPICILLIN >=32 RESISTANT Resistant     CEFAZOLIN >=64 RESISTANT Resistant     CEFTRIAXONE >=64 RESISTANT Resistant     CIPROFLOXACIN >=4 RESISTANT Resistant     GENTAMICIN <=1 SENSITIVE Sensitive     LEVOFLOXACIN  >=8 RESISTANT Resistant     NITROFURANTOIN <=16 SENSITIVE Sensitive     TOBRAMYCIN >=16 RESISTANT Resistant     TRIMETH/SULFA >=320 RESISTANT Resistant     IMIPENEM <=0.25 SENSITIVE Sensitive     PIP/TAZO 8 SENSITIVE Sensitive     * ESCHERICHIA COLI  Culture, blood (routine x 2)     Status: None   Collection Time: 02/18/15  1:30 PM  Result Value Ref Range Status   Specimen Description BLOOD RIGHT ARM  Final   Special Requests   Final    BOTTLES DRAWN AEROBIC AND ANAEROBIC 10CC BLUE 5CC RED   Culture   Final    NO GROWTH 5 DAYS Performed at [facility]    Report Status 02/24/2015 FINAL  Final  Culture, blood (routine x 2)     Status: None   Collection Time: 02/18/15  1:34 PM  Result Value Ref Range Status   Specimen Description BLOOD RIGHT HAND  Final   Special Requests BOTTLES DRAWN AEROBIC ONLY 10CC  Final   Culture   Final    DIPHTHEROIDS(CORYNEBACTERIUM SPECIES) Note: Standardized susceptibility testing for this organism is not available. Note: Gram Stain Report Called to,Read Back By and Verified With: [facility] K@ 1537 ON [id number] BY [facility] Performed at [facility]    Report Status 02/24/2015 FINAL  Final  MRSA PCR Screening     Status: None   Collection Time:  02/18/15  2:18 PM  Result Value Ref Range Status   MRSA by PCR NEGATIVE NEGATIVE Final    Comment:        The GeneXpert MRSA Assay (FDA approved for NASAL specimens only), is one component of a comprehensive MRSA colonization surveillance program. It is not intended to diagnose MRSA infection nor to guide or monitor treatment for MRSA infections.   Clostridium Difficile by PCR     Status: None   Collection Time: 02/19/15 10:18 AM  Result Value Ref Range Status   C difficile by pcr NEGATIVE NEGATIVE Final    Medical History: Past Medical History  Diagnosis Date  . Mental disorder   . Bipolar 1 disorder   . Bipolar 1 disorder   . Yeast infection of the vagina   . Bipolar disorder   .  Obesity   . UTI (lower urinary tract infection)   . Alcohol abuse   . Shortness of breath dyspnea   . Anemia   . 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  Recent ESBL UTI noted (4/30).  First dose of antibiotics already given.    Goal of Therapy:  Vancomycin trough level 15-20 mcg/ml  Cefepime dosed based on patient weight and renal function   Plan:  Measure antibiotic drug levels at steady state Follow up culture results Vancomycin 1250mg  iv q8hr--next dose at 1000  Cefepime 2gm iv q8hr   Tyler Deis, Shea Stakes Crowford 03/03/2015,4:59 AM

## 2015-03-03 NOTE — H&P (Signed)
[facility] History and Physical  Jordan [name] WEX:[id number] DOB: [birth year] DOA: 03/02/2015  Referring physician: Dr. Canary Brim. PCP: [name], FNP  Specialists: Dr. Beryle Beams. Oncologist.  Chief Complaint: Weakness.  HPI: Jordan [name] is a 49 y.o. female with history of B-12 deficiency and bipolar disorder who was recently admitted for sepsis secondary to ESBL UTI and at that time patient was found to have significant pancytopenia and was diagnosed with B-12 deficiency and has been on B-12 shots at this time present to the ER because of weakness. Patient states that he she's been feeling intensely weak dizzy. Denies any loss of consciousness. On exam patient is also found to have mild shortness of breath. Chest x-ray shows infiltrates concerning for pneumonia with effusion. Patient has been admitted for further management.   Review of Systems: As presented in the history of presenting illness, rest negative.  Past Medical History  Diagnosis Date  . Mental disorder   . Bipolar 1 disorder   . Bipolar 1 disorder   . Yeast infection of the vagina   . Bipolar disorder   . Obesity   . UTI (lower urinary tract infection)   . Alcohol abuse   . Shortness of breath dyspnea   . Anemia   . 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 Cholecystectomy     Social History:  reports that she has never smoked. She has never used smokeless tobacco. She reports that she does not drink alcohol or use illicit drugs. Where does patient live home. Can patient participate in ADLs? Yes.  Allergies  Allergen Reactions  . Tramadol Anaphylaxis, Shortness Of Breath and Swelling  . Sulfa Antibiotics Swelling  . Citalopram     Possible cause of pancytopenia  . Lamotrigine     Possible cause of pancytopenia    Family History: History reviewed. No pertinent family history. grandmother had diabetes mellitus.   Prior  to Admission medications   Medication Sig Start Date End Date Taking? Authorizing Provider  ALPRAZolam Duanne Moron) 0.5 MG tablet Take 1 tablet (0.5 mg total) by mouth 3 (three) times daily as needed for anxiety. 02/23/15  Yes Shanker Kristeen Mans, MD  folic acid (FOLVITE) 1 MG tablet Take 1 tablet (1 mg total) by mouth daily. 02/23/15  Yes Shanker Kristeen Mans, MD  PARoxetine (PAXIL-CR) 25 MG 24 hr tablet Take 1 tablet (25 mg total) by mouth daily. 02/23/15  Yes Shanker Kristeen Mans, MD  QUEtiapine (SEROQUEL) 100 MG tablet Take 1 tablet (100 mg total) by mouth at bedtime. 02/23/15  Yes Shanker Kristeen Mans, MD  thiamine 100 MG tablet Take 1 tablet (100 mg total) by mouth daily. 02/23/15  Yes Shanker Kristeen Mans, MD  cyanocobalamin (,VITAMIN B-12,) 1000 MCG/ML injection Inject subcutaneously q Daily till 02/27/15,then, Inject subcutaneously q weekly for 4 weeks from 02/28/15, Then from 03/29/15-inject subcutaneously q monthly and stay on it 02/23/15   Jonetta Osgood, MD    Physical Exam: Filed Vitals:   03/02/15 2007 03/02/15 2208 03/02/15 2300 03/03/15 0028  BP: 109/71 91/61 124/84 116/79  Pulse: 88 87 94 84  Temp:   98.4 F (36.9 C) 98.4 F (36.9 C)  TempSrc:   Oral Oral  Resp: 18 20 20 20   SpO2: 97% 97% 96% 95%     General:  Well-developed and nourished.  Eyes: Anicteric no pallor.  ENT: No discharge from the ears eyes nose and mouth.  Neck: No mass  felt.  Cardiovascular: S1 and S2 heard.  Respiratory: No rhonchi or crepitations.  Abdomen: Soft nontender bowel sounds present.  Skin: No rash.  Musculoskeletal: No edema.  Psychiatric: Appears normal.  Neurologic: Alert awake oriented to time place and person. Moves all extremities.  Labs on Admission:  Basic Metabolic Panel:  Recent Labs Lab 03/02/15 1932  NA 139  K 4.6  CL 105  CO2 23  GLUCOSE 95  BUN 9  CREATININE 0.48  CALCIUM 8.6*   Liver Function Tests:  Recent Labs Lab 03/02/15 1932  AST 104*  ALT 31  ALKPHOS 93  BILITOT  4.4*  PROT 5.8*  ALBUMIN 3.5    Recent Labs Lab 03/02/15 1932  LIPASE 17*   No results for input(s): AMMONIA in the last 168 hours. CBC:  Recent Labs Lab 03/02/15 2044  WBC 6.3  NEUTROABS 4.7  HGB 11.4*  HCT 35.0*  MCV 99.4  PLT REPEATED TO VERIFY   Cardiac Enzymes: No results for input(s): CKTOTAL, CKMB, CKMBINDEX, TROPONINI in the last 168 hours.  BNP (last 3 results) No results for input(s): BNP in the last 8760 hours.  ProBNP (last 3 results)  Recent Labs  05/10/14 1047  PROBNP 173.6*    CBG: No results for input(s): GLUCAP in the last 168 hours.  Radiological Exams on Admission: Dg Chest Port 1 View  03/02/2015   CLINICAL DATA:  Generalized weakness, nausea, diarrhea. Patient was discharged from the hospital last week with worsening weakness. Watery diarrhea. Shortness of breath.  EXAM: PORTABLE CHEST - 1 VIEW  COMPARISON:  02/18/2015  FINDINGS: Linear infiltration and volume loss in the right lung base. Possible small right pleural effusion. Changes may indicate pneumonia. Left lung is clear. Borderline heart size and pulmonary vascularity are probably normal for technique. Mediastinal contours appear intact. No pneumothorax.  IMPRESSION: Infiltration in the right lung base with probable small right pleural effusion. Changes likely to represent pneumonia.   Electronically Signed   By: Lucienne Capers M.D.   On: 03/02/2015 23:58     Assessment/Plan Principal Problem:   Pneumonia Active Problems:   Bipolar disorder   UTI (lower urinary tract infection)   Weakness   Diarrhea   1. Pneumonia - patient has been placed on vancomycin and cefepime for healthcare associated pneumonia. Patient's chest x-ray of which I have personally reviewed also shows some effusion. Check BNP and 2-D echo. 2. Weakness probably from deconditioning - get physical therapy consult check orthostatics. 3. Recent UTI - UA shows features concerning for UTI still. Check urine  cultures. 4. Diarrhea - patient had one episode of diarrhea yesterday. Check stool for C. difficile and cultures. 5. B-12 deficiency - patient will be on vitamin B-12 shots every week as patient has just recently finished daily shots. Closely follow CBC. 6. Pancytopenia with hemoglobin at this time improving secondary to B-12 injection. 7. Bipolar disorder - patient's medications were recently changed. Continue present medications.   DVT ProphylaxisSCDs. Patient has significant thrombocytopenia.  Code Status: Full code.  Family Communication: Discussed with patient.  Disposition Plan: Admit to inpatient. Likely stay 2 days.    Harrel Ferrone N. [facility] Pager [phone].  If 7PM-7AM, please contact night-coverage [email or web address] Password [email or web address] 03/03/2015, 12:54 AM

## 2015-03-03 NOTE — Progress Notes (Signed)
49 year old lady admitted earlier today for generalized weakness was found to have health care associated pneumonia>  She was started on IV vancomycin and IV cefepime.  Subjective: Feeling dizzy on standing.  Exam: She is alert and afebrile , does not appear to be in any distress.  CVS s1s2,  Lungs clear, no wheezing heard Abdomen: soft non tender non distended bowel sounds heard.  Extremities: trace pedal edema.  Continue with antibiotics and monitor.   Jeoffrey Massed Alonza Knisley,MD [phone]

## 2015-03-03 NOTE — Progress Notes (Signed)
PT Cancellation Note / Screen  Patient Details Name: Jordan [name] MRN: [id number] DOB: June 21, 1966   Cancelled Treatment:    Reason Eval/Treat Not Completed: PT screened, no needs identified, will sign off  Spoke with pt at bedside.  She is familiar with PT from recent admission to [facility].  She reports she has been using her w/c and only concern/request would be for a ramp to be built.  Pt declines any PT needs.  CM made aware of ramp request and CM or CSW to provide ramp resources.  PT to sign off.   Joann Kulpa,KATHrine E 03/03/2015, 11:35 AM Carmelia Bake, PT, DPT 03/03/2015 Pager: [phone]

## 2015-03-03 NOTE — Progress Notes (Signed)
Refused orthostatic v/s

## 2015-03-03 NOTE — Care Management Note (Signed)
Case Management Note  Patient Details  Name: Jordan [name] MRN: [id number] Date of Birth: Dec 16, 1965  Subjective/Objective:     49 yo female admitted with pna               Action/Plan:  Patient stated that she lives with her boyfriend and does follow up with her PCP. She stated that she has the wc, walker, and 3N1 that she received last hospitalization. She requested information about a ramp so this CM provided the patient with resources (that were provided by CSW). Patient stated that her plan is to return home with her boyfriend upon discharge. No other needs. Expected Discharge Date:                  Expected Discharge Plan:  Home/Self Care  In-House Referral:  Clinical Social Work  Discharge planning Services  CM Consult  Post Acute Care Choice:  NA Choice offered to:  NA  DME Arranged:    DME Agency:     HH Arranged:    [place] Agency:     Status of Service:  Completed, signed off  Medicare Important Message Given:    Date Medicare IM Given:    Medicare IM give by:    Date Additional Medicare IM Given:    Additional Medicare Important Message give by:     If discussed at [place] of Stay Meetings, dates discussed:    Additional Comments:  Scot Dock, RN 03/03/2015, 1:25 PM

## 2015-03-03 NOTE — Progress Notes (Signed)
  Echocardiogram 2D Echocardiogram has been performed.  Jordan [name] 03/03/2015, 8:54 AM

## 2015-03-04 DIAGNOSIS — F319 Bipolar disorder, unspecified: Secondary | ICD-10-CM

## 2015-03-04 DIAGNOSIS — N390 Urinary tract infection, site not specified: Secondary | ICD-10-CM

## 2015-03-04 LAB — BASIC METABOLIC PANEL
Anion gap: 8 (ref 5–15)
BUN: 6 mg/dL (ref 6–20)
CO2: 23 mmol/L (ref 22–32)
Calcium: 8.1 mg/dL — ABNORMAL LOW (ref 8.9–10.3)
Chloride: 111 mmol/L (ref 101–111)
Creatinine, Ser: 0.47 mg/dL (ref 0.44–1.00)
GFR calc Af Amer: >60 mL/min (ref >60)
GFR calc non Af Amer: >60 mL/min (ref >60)
Glucose, Bld: 84 mg/dL (ref 65–99)
Potassium: 3.4 mmol/L — ABNORMAL LOW (ref 3.5–5.1)
Sodium: 142 mmol/L (ref 135–145)

## 2015-03-04 LAB — MAGNESIUM: Magnesium: 2 mg/dL (ref 1.7–2.4)

## 2015-03-04 MED ORDER — NYSTATIN 100000 UNIT/ML MT SUSP
5.0000 mL | Freq: Four times a day (QID) | OROMUCOSAL | Status: DC
  Administered 2015-03-04 – 2015-03-05 (×5): 500000 [IU] via ORAL
  Filled 2015-03-04 (×6): qty 5

## 2015-03-04 MED ORDER — POTASSIUM CHLORIDE CRYS ER 20 MEQ PO TBCR
40.0000 meq | EXTENDED_RELEASE_TABLET | Freq: Two times a day (BID) | ORAL | Status: AC
  Administered 2015-03-04 (×2): 40 meq via ORAL
  Filled 2015-03-04 (×2): qty 2

## 2015-03-04 NOTE — Progress Notes (Signed)
[facility] PROGRESS NOTE  Jordan [name] QIH:[id number] DOB: Dec 11, 1965 DOA: 03/02/2015 PCP: [name], FNP  Assessment/Plan: 1. Health care associated pneumonia and UTI: - on broad spectrum antibiotics, IV vancomycin and IV cefepime. Urine cultures and blood cultures are pending.   Weakness probably from deconditioned. Refusing PT eval for now.   Diarrhea; resolved. c diff PCR is negative.    Bipolar: resume home meds.   Hypokalemia: replete as needed.      Code Status: full code.  Family Communication: none atbedside Disposition Plan: pending.    Consultants:  none  Procedures:  none  Antibiotics:  IV vancomycin   IV cefepime  HPI/Subjective: Requesting beef broth, reports she doesn't want to get out of bed.   Objective: Filed Vitals:   03/04/15 0613  BP: 120/62  Pulse: 78  Temp: 98.4 F (36.9 C)  Resp: 16    Intake/Output Summary (Last 24 hours) at 03/04/15 1828 Last data filed at 03/04/15 1633  Gross per 24 hour  Intake   1610 ml  Output    775 ml  Net    835 ml   Filed Weights    Exam:   General:  Alert afebrile comfortable  Cardiovascular: s1s2  Respiratory: clear to auscultation, no wheezing  Abdomen: soft non tender non distended   Musculoskeletal: trace pedal edema.   Data Reviewed: Basic Metabolic Panel:  Recent Labs Lab 03/02/15 1932 03/03/15 0030 03/03/15 0500 03/04/15 0515  NA 139  --  139 142  K 4.6  --  3.1* 3.4*  CL 105  --  106 111  CO2 23  --  24 23  GLUCOSE 95  --  96 84  BUN 9  --  9 6  CREATININE 0.48  --  0.56 0.47  CALCIUM 8.6*  --  8.1* 8.1*  MG  --  2.0  --  2.0   Liver Function Tests:  Recent Labs Lab 03/02/15 1932 03/03/15 0500  AST 104* 62*  ALT 31 23  ALKPHOS 93 76  BILITOT 4.4* 3.7*  PROT 5.8* 5.0*  ALBUMIN 3.5 3.0*    Recent Labs Lab 03/02/15 1932  LIPASE 17*   No results for input(s): AMMONIA in the last 168 hours. CBC:  Recent Labs Lab 03/02/15 2044  03/03/15 0500  WBC 6.3 5.3  NEUTROABS 4.7  --   HGB 11.4* 9.0*  HCT 35.0* 28.8*  MCV 99.4 102.1*  PLT REPEATED TO VERIFY 278   Cardiac Enzymes:  Recent Labs Lab 03/03/15 0028  TROPONINI <0.03   BNP (last 3 results)  Recent Labs  03/03/15 0028  BNP 31.7    ProBNP (last 3 results)  Recent Labs  05/10/14 1047  PROBNP 173.6*    CBG: No results for input(s): GLUCAP in the last 168 hours.  Recent Results (from the past 240 hour(s))  Urine culture     Status: None (Preliminary result)   Collection Time: 03/02/15  8:02 PM  Result Value Ref Range Status   Specimen Description URINE, CLEAN CATCH  Final   Special Requests NONE  Final   Colony Count   Final    >=100,000 COLONIES/ML Performed at [facility]    Culture   Final    ESCHERICHIA COLI Performed at [facility]    Report Status PENDING  Incomplete  Clostridium Difficile by PCR     Status: None   Collection Time: 03/03/15  1:00 PM  Result Value Ref Range Status   C difficile by pcr  NEGATIVE NEGATIVE Final     Studies: Dg Chest Port 1 View  03/02/2015   CLINICAL DATA:  Generalized weakness, nausea, diarrhea. Patient was discharged from the hospital last week with worsening weakness. Watery diarrhea. Shortness of breath.  EXAM: PORTABLE CHEST - 1 VIEW  COMPARISON:  02/18/2015  FINDINGS: Linear infiltration and volume loss in the right lung base. Possible small right pleural effusion. Changes may indicate pneumonia. Left lung is clear. Borderline heart size and pulmonary vascularity are probably normal for technique. Mediastinal contours appear intact. No pneumothorax.  IMPRESSION: Infiltration in the right lung base with probable small right pleural effusion. Changes likely to represent pneumonia.   Electronically Signed   By: Lucienne Capers M.D.   On: 03/02/2015 23:58    Scheduled Meds: . ceFEPime (MAXIPIME) IV  2 g Intravenous 3 times per day  . [START ON 03/07/2015] cyanocobalamin  1,000  mcg Subcutaneous Q7 days  . folic acid  1 mg Oral Daily  . nystatin  5 mL Oral QID  . PARoxetine  25 mg Oral Daily  . potassium chloride  40 mEq Oral BID  . QUEtiapine  100 mg Oral QHS  . sodium chloride  3 mL Intravenous Q12H  . thiamine  100 mg Oral Daily  . vancomycin  1,250 mg Intravenous Q8H   Continuous Infusions:   Principal Problem:   Pneumonia Active Problems:   Bipolar disorder   UTI (lower urinary tract infection)   Weakness   Diarrhea    Time spent: 35 minutes.     [facility] Pager [phone] . If 7PM-7AM, please contact night-coverage at [email or web address], password [facility] 03/04/2015, 6:28 PM  LOS: 1 day

## 2015-03-05 ENCOUNTER — Inpatient Hospital Stay (HOSPITAL_COMMUNITY): Payer: Medicaid Other

## 2015-03-05 LAB — URINE CULTURE: Colony Count: >=100000

## 2015-03-05 LAB — PROCALCITONIN: Procalcitonin: 0.11 ng/mL

## 2015-03-05 MED ORDER — SODIUM CHLORIDE 0.9 % IV SOLN
500.0000 mg | Freq: Four times a day (QID) | INTRAVENOUS | Status: DC
  Administered 2015-03-05 (×2): 500 mg via INTRAVENOUS
  Filled 2015-03-05 (×4): qty 500

## 2015-03-05 NOTE — Progress Notes (Signed)
ANTIBIOTIC CONSULT NOTE - INITIAL  Pharmacy Consult for Primaxin Indication: ESBL E.Coli UTI  Allergies  Allergen Reactions  . Tramadol Anaphylaxis, Shortness Of Breath and Swelling  . Sulfa Antibiotics Swelling  . Citalopram     Possible cause of pancytopenia  . Lamotrigine     Possible cause of pancytopenia    Patient Measurements: Weight:  (Pt refuses at this time. Lift needed.) Adjusted Body Weight:   Vital Signs: Temp: 97.3 F (36.3 C) (05/15 0602) Temp Source: Oral (05/15 0602) BP: 109/64 mmHg (05/15 0602) Pulse Rate: 66 (05/15 0602) Intake/Output from previous day: 05/14 0701 - 05/15 0700 In: 1970 [P.O.:120; IV Piggyback:1850] Out: 1100 [Urine:1100] Intake/Output from this shift:    Labs:  Recent Labs  03/02/15 1932 03/02/15 2044 03/03/15 0500 03/04/15 0515  WBC  --  6.3 5.3  --   HGB  --  11.4* 9.0*  --   PLT  --  REPEATED TO VERIFY 278  --   CREATININE 0.48  --  0.56 0.47   Estimated Creatinine Clearance: 132.1 mL/min (by C-G formula based on Cr of 0.47). No results for input(s): VANCOTROUGH, VANCOPEAK, VANCORANDOM, GENTTROUGH, GENTPEAK, GENTRANDOM, TOBRATROUGH, TOBRAPEAK, TOBRARND, AMIKACINPEAK, AMIKACINTROU, AMIKACIN in the last 72 hours.   Microbiology: Recent Results (from the past 720 hour(s))  Urine culture     Status: None   Collection Time: 02/18/15 12:08 PM  Result Value Ref Range Status   Specimen Description URINE, CLEAN CATCH  Final   Special Requests NONE  Final   Colony Count   Final    >=100,000 COLONIES/ML Performed at [facility]    Culture   Final    ESCHERICHIA COLI Note: Confirmed Extended Spectrum Beta-Lactamase Producer (ESBL) Note: CRITICAL RESULT CALLED TO, READ BACK BY AND VERIFIED WITH: WHITNEY 5/2 AT 1605 BY DUNNJ Performed at [facility]    Report Status 02/20/2015 FINAL  Final   Organism ID, Bacteria ESCHERICHIA COLI  Final      Susceptibility   Escherichia coli - MIC*    AMPICILLIN >=32  RESISTANT Resistant     CEFAZOLIN >=64 RESISTANT Resistant     CEFTRIAXONE >=64 RESISTANT Resistant     CIPROFLOXACIN >=4 RESISTANT Resistant     GENTAMICIN <=1 SENSITIVE Sensitive     LEVOFLOXACIN >=8 RESISTANT Resistant     NITROFURANTOIN <=16 SENSITIVE Sensitive     TOBRAMYCIN >=16 RESISTANT Resistant     TRIMETH/SULFA >=320 RESISTANT Resistant     IMIPENEM <=0.25 SENSITIVE Sensitive     PIP/TAZO 8 SENSITIVE Sensitive     * ESCHERICHIA COLI  Culture, blood (routine x 2)     Status: None   Collection Time: 02/18/15  1:30 PM  Result Value Ref Range Status   Specimen Description BLOOD RIGHT ARM  Final   Special Requests   Final    BOTTLES DRAWN AEROBIC AND ANAEROBIC 10CC BLUE 5CC RED   Culture   Final    NO GROWTH 5 DAYS Performed at [facility]    Report Status 02/24/2015 FINAL  Final  Culture, blood (routine x 2)     Status: None   Collection Time: 02/18/15  1:34 PM  Result Value Ref Range Status   Specimen Description BLOOD RIGHT HAND  Final   Special Requests BOTTLES DRAWN AEROBIC ONLY 10CC  Final   Culture   Final    DIPHTHEROIDS(CORYNEBACTERIUM SPECIES) Note: Standardized susceptibility testing for this organism is not available. Note: Gram Stain Report Called to,Read Back By and Verified  With: [facility] K@ 1537 ON [id number] BY [facility] Performed at [facility]    Report Status 02/24/2015 FINAL  Final  MRSA PCR Screening     Status: None   Collection Time: 02/18/15  2:18 PM  Result Value Ref Range Status   MRSA by PCR NEGATIVE NEGATIVE Final    Comment:        The GeneXpert MRSA Assay (FDA approved for NASAL specimens only), is one component of a comprehensive MRSA colonization surveillance program. It is not intended to diagnose MRSA infection nor to guide or monitor treatment for MRSA infections.   Clostridium Difficile by PCR     Status: None   Collection Time: 02/19/15 10:18 AM  Result Value Ref Range Status   C difficile by pcr NEGATIVE  NEGATIVE Final  Urine culture     Status: None   Collection Time: 03/02/15  8:02 PM  Result Value Ref Range Status   Specimen Description URINE, CLEAN CATCH  Final   Special Requests NONE  Final   Colony Count   Final    >=100,000 COLONIES/ML Performed at [facility]    Culture   Final    ESCHERICHIA COLI Note: Confirmed Extended Spectrum Beta-Lactamase Producer (ESBL) Performed at [facility]    Report Status 03/05/2015 FINAL  Final   Organism ID, Bacteria ESCHERICHIA COLI  Final      Susceptibility   Escherichia coli - MIC*    AMPICILLIN >=32 RESISTANT Resistant     CEFAZOLIN >=64 RESISTANT Resistant     CEFTRIAXONE RESISTANT      CIPROFLOXACIN >=4 RESISTANT Resistant     GENTAMICIN <=1 SENSITIVE Sensitive     LEVOFLOXACIN >=8 RESISTANT Resistant     NITROFURANTOIN <=16 SENSITIVE Sensitive     TOBRAMYCIN >=16 RESISTANT Resistant     TRIMETH/SULFA >=320 RESISTANT Resistant     IMIPENEM <=0.25 SENSITIVE Sensitive     PIP/TAZO 8 SENSITIVE Sensitive     * ESCHERICHIA COLI  Clostridium Difficile by PCR     Status: None   Collection Time: 03/03/15  1:00 PM  Result Value Ref Range Status   C difficile by pcr NEGATIVE NEGATIVE Final    Medical History: Past Medical History  Diagnosis Date  . Mental disorder   . Bipolar 1 disorder   . Bipolar 1 disorder   . Yeast infection of the vagina   . Bipolar disorder   . Obesity   . UTI (lower urinary tract infection)   . Alcohol abuse   . Shortness of breath dyspnea   . Anemia   . 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 Antibiotics changed today to Primaxin to cover for ESBL E.Coli UTI.  No blood cultures obtained on admission.  Note pt recently hospitalized 4/30 - 5/5 and treated for urosepsis with same ESBL organism during admission, no antibiotics continued outpatient per ID recommendations.   Pt currently afebrile. WBC WNL on 5/13.  Renal fxn > 100.   5/13 >> Vancomycin >> 5/15 5/13 >> Cefepime >> 5/15 5/15 >> Primaxin >>  5/12 urine: >100K ESBL E.Coli  5/13 Cdiff neg 5/13 stool: sent  Goal of Therapy:  Eradication of infection  Plan:  Start primaxin 500mg  IV q6h F/u renal function, clinical course  Ralene Bathe, PharmD, BCPS 03/05/2015, 11:24 AM  Pager: [phone]

## 2015-03-05 NOTE — Progress Notes (Signed)
[facility] PROGRESS NOTE  Jordan [name] CWC:[id number] DOB: Feb 27, 1966 DOA: 03/02/2015 PCP: [name], FNP  Assessment/Plan: 1. Health care associated pneumonia and  Symptomatic UTI: - on broad spectrum antibiotics, IV vancomycin and IV cefepime. Urine cultures grew ESBL E coli sensitive to primaxin, . D/c vancomycin and cefepime and continue primaxin to complete the course. Will consult ID for recommendations. Get CXR today.  Weakness probably from deconditioned. Evaluation by PT pending.   Diarrhea; resolved. c diff PCR is negative.    Bipolar: resume home meds.   Hypokalemia: replete as needed. Repeat in am.   Anemia: drop of hemoglobin 2gms from admission, no bpr. Repeat CBC in am to check hemoglobin.      Code Status: full code.  Family Communication: none atbedside Disposition Plan: pending, possibly to SNF tomorrow.    Consultants:  none  Procedures:  none  Antibiotics:  IV vancomycin   IV cefepime  HPI/Subjective: Reports her dizziness improved.  Objective: Filed Vitals:   03/05/15 0602  BP: 109/64  Pulse: 66  Temp: 97.3 F (36.3 C)  Resp: 18    Intake/Output Summary (Last 24 hours) at 03/05/15 1055 Last data filed at 03/05/15 0555  Gross per 24 hour  Intake   1970 ml  Output   1100 ml  Net    870 ml   Filed Weights    Exam:   General:  Alert afebrile comfortable  Cardiovascular: s1s2  Respiratory: clear to auscultation, no wheezing  Abdomen: soft non tender non distended   Musculoskeletal: trace pedal edema.   Data Reviewed: Basic Metabolic Panel:  Recent Labs Lab 03/02/15 1932 03/03/15 0030 03/03/15 0500 03/04/15 0515  NA 139  --  139 142  K 4.6  --  3.1* 3.4*  CL 105  --  106 111  CO2 23  --  24 23  GLUCOSE 95  --  96 84  BUN 9  --  9 6  CREATININE 0.48  --  0.56 0.47  CALCIUM 8.6*  --  8.1* 8.1*  MG  --  2.0  --  2.0   Liver Function Tests:  Recent Labs Lab 03/02/15 1932 03/03/15 0500  AST  104* 62*  ALT 31 23  ALKPHOS 93 76  BILITOT 4.4* 3.7*  PROT 5.8* 5.0*  ALBUMIN 3.5 3.0*    Recent Labs Lab 03/02/15 1932  LIPASE 17*   No results for input(s): AMMONIA in the last 168 hours. CBC:  Recent Labs Lab 03/02/15 2044 03/03/15 0500  WBC 6.3 5.3  NEUTROABS 4.7  --   HGB 11.4* 9.0*  HCT 35.0* 28.8*  MCV 99.4 102.1*  PLT REPEATED TO VERIFY 278   Cardiac Enzymes:  Recent Labs Lab 03/03/15 0028  TROPONINI <0.03   BNP (last 3 results)  Recent Labs  03/03/15 0028  BNP 31.7    ProBNP (last 3 results)  Recent Labs  05/10/14 1047  PROBNP 173.6*    CBG: No results for input(s): GLUCAP in the last 168 hours.  Recent Results (from the past 240 hour(s))  Urine culture     Status: None   Collection Time: 03/02/15  8:02 PM  Result Value Ref Range Status   Specimen Description URINE, CLEAN CATCH  Final   Special Requests NONE  Final   Colony Count   Final    >=100,000 COLONIES/ML Performed at [facility]    Culture   Final    ESCHERICHIA COLI Note: Confirmed Extended Spectrum Beta-Lactamase Producer (ESBL) Performed at  [facility]    Report Status 03/05/2015 FINAL  Final   Organism ID, Bacteria ESCHERICHIA COLI  Final      Susceptibility   Escherichia coli - MIC*    AMPICILLIN >=32 RESISTANT Resistant     CEFAZOLIN >=64 RESISTANT Resistant     CEFTRIAXONE RESISTANT      CIPROFLOXACIN >=4 RESISTANT Resistant     GENTAMICIN <=1 SENSITIVE Sensitive     LEVOFLOXACIN >=8 RESISTANT Resistant     NITROFURANTOIN <=16 SENSITIVE Sensitive     TOBRAMYCIN >=16 RESISTANT Resistant     TRIMETH/SULFA >=320 RESISTANT Resistant     IMIPENEM <=0.25 SENSITIVE Sensitive     PIP/TAZO 8 SENSITIVE Sensitive     * ESCHERICHIA COLI  Clostridium Difficile by PCR     Status: None   Collection Time: 03/03/15  1:00 PM  Result Value Ref Range Status   C difficile by pcr NEGATIVE NEGATIVE Final     Studies: No results found.  Scheduled Meds: .  ceFEPime (MAXIPIME) IV  2 g Intravenous 3 times per day  . [START ON 03/07/2015] cyanocobalamin  1,000 mcg Subcutaneous Q7 days  . folic acid  1 mg Oral Daily  . nystatin  5 mL Oral QID  . PARoxetine  25 mg Oral Daily  . QUEtiapine  100 mg Oral QHS  . sodium chloride  3 mL Intravenous Q12H  . thiamine  100 mg Oral Daily  . vancomycin  1,250 mg Intravenous Q8H   Continuous Infusions:   Principal Problem:   Pneumonia Active Problems:   Bipolar disorder   UTI (lower urinary tract infection)   Weakness   Diarrhea    Time spent: 35 minutes.     [facility] Pager [phone] . If 7PM-7AM, please contact night-coverage at [email or web address], password [facility] 03/05/2015, 10:55 AM  LOS: 2 days

## 2015-03-05 NOTE — Progress Notes (Signed)
Pt expressed her frustration and wanting to go home again this evening.  Staff explained the risk of leaving AMA, and she understood.  Dr on call and [facility] notified, and AMA paperwork signed and placed in chart.

## 2015-03-06 LAB — LEGIONELLA ANTIGEN, URINE

## 2015-03-06 NOTE — Discharge Summary (Signed)
49 year old lady admitted for health care associated pneumonia and ESBL UTI, was initially started on broad spectrum antibiotics, later on transitioned to primaxin. But patient left AMA on the evening of 5/15 without any antibiotics.   Hosie Poisson, MD [phone]

## 2015-03-07 LAB — GI PATHOGEN PANEL BY PCR, STOOL
C difficile toxin A/B: NOT DETECTED
Campylobacter by PCR: NOT DETECTED
Cryptosporidium by PCR: NOT DETECTED
E coli (ETEC) LT/ST: NOT DETECTED
E coli (STEC): NOT DETECTED
E coli 0157 by PCR: NOT DETECTED
G lamblia by PCR: NOT DETECTED
Norovirus GI/GII: NOT DETECTED
Rotavirus A by PCR: NOT DETECTED
Salmonella by PCR: NOT DETECTED
Shigella by PCR: NOT DETECTED

## 2015-03-14 ENCOUNTER — Encounter (HOSPITAL_COMMUNITY): Payer: Self-pay

## 2015-03-16 ENCOUNTER — Emergency Department (HOSPITAL_COMMUNITY): Payer: Medicaid Other

## 2015-03-16 ENCOUNTER — Encounter (HOSPITAL_COMMUNITY): Payer: Self-pay | Admitting: *Deleted

## 2015-03-16 ENCOUNTER — Inpatient Hospital Stay (HOSPITAL_COMMUNITY)
Admission: EM | Admit: 2015-03-16 | Discharge: 2015-03-17 | DRG: 690 | Discharge status: Against Medical Advice | Payer: Medicaid Other | Attending: Internal Medicine | Admitting: Internal Medicine

## 2015-03-16 DIAGNOSIS — E669 Obesity, unspecified: Secondary | ICD-10-CM | POA: Diagnosis present

## 2015-03-16 DIAGNOSIS — Z87440 Personal history of urinary (tract) infections: Secondary | ICD-10-CM

## 2015-03-16 DIAGNOSIS — Z6841 Body Mass Index (BMI) 40.0 and over, adult: Secondary | ICD-10-CM

## 2015-03-16 DIAGNOSIS — R42 Dizziness and giddiness: Secondary | ICD-10-CM

## 2015-03-16 DIAGNOSIS — B9620 Unspecified Escherichia coli [E. coli] as the cause of diseases classified elsewhere: Secondary | ICD-10-CM | POA: Diagnosis present

## 2015-03-16 DIAGNOSIS — Z79899 Other long term (current) drug therapy: Secondary | ICD-10-CM

## 2015-03-16 DIAGNOSIS — R531 Weakness: Secondary | ICD-10-CM

## 2015-03-16 DIAGNOSIS — N390 Urinary tract infection, site not specified: Principal | ICD-10-CM

## 2015-03-16 DIAGNOSIS — F419 Anxiety disorder, unspecified: Secondary | ICD-10-CM | POA: Diagnosis present

## 2015-03-16 DIAGNOSIS — E538 Deficiency of other specified B group vitamins: Secondary | ICD-10-CM | POA: Diagnosis present

## 2015-03-16 DIAGNOSIS — A499 Bacterial infection, unspecified: Secondary | ICD-10-CM

## 2015-03-16 DIAGNOSIS — Z1612 Extended spectrum beta lactamase (ESBL) resistance: Secondary | ICD-10-CM | POA: Diagnosis present

## 2015-03-16 DIAGNOSIS — F319 Bipolar disorder, unspecified: Secondary | ICD-10-CM | POA: Diagnosis present

## 2015-03-16 DIAGNOSIS — J189 Pneumonia, unspecified organism: Secondary | ICD-10-CM | POA: Diagnosis present

## 2015-03-16 LAB — URINALYSIS, ROUTINE W REFLEX MICROSCOPIC
Glucose, UA: NEGATIVE mg/dL
Hgb urine dipstick: NEGATIVE
Ketones, ur: NEGATIVE mg/dL
Nitrite: POSITIVE — AB
Protein, ur: NEGATIVE mg/dL
Specific Gravity, Urine: 1.02 (ref 1.005–1.030)
Urobilinogen, UA: 1 mg/dL (ref 0.0–1.0)
pH: 6 (ref 5.0–8.0)

## 2015-03-16 LAB — URINE MICROSCOPIC-ADD ON

## 2015-03-16 LAB — I-STAT TROPONIN, ED: Troponin i, poc: 0 ng/mL (ref 0.00–0.08)

## 2015-03-16 LAB — BASIC METABOLIC PANEL
Anion gap: 10 (ref 5–15)
BUN: 6 mg/dL (ref 6–20)
CO2: 22 mmol/L (ref 22–32)
Calcium: 8.8 mg/dL — ABNORMAL LOW (ref 8.9–10.3)
Chloride: 107 mmol/L (ref 101–111)
Creatinine, Ser: 0.54 mg/dL (ref 0.44–1.00)
GFR calc Af Amer: >60 mL/min (ref >60)
GFR calc non Af Amer: >60 mL/min (ref >60)
Glucose, Bld: 109 mg/dL — ABNORMAL HIGH (ref 65–99)
Potassium: 4.1 mmol/L (ref 3.5–5.1)
Sodium: 139 mmol/L (ref 135–145)

## 2015-03-16 LAB — CBC
HCT: 41.2 % (ref 36.0–46.0)
Hemoglobin: 13 g/dL (ref 12.0–15.0)
MCH: 30.4 pg (ref 26.0–34.0)
MCHC: 31.6 g/dL (ref 30.0–36.0)
MCV: 96.5 fL (ref 78.0–100.0)
Platelets: 208 10*3/uL (ref 150–400)
RBC: 4.27 MIL/uL (ref 3.87–5.11)
RDW: 20.4 % — ABNORMAL HIGH (ref 11.5–15.5)
WBC: 4.9 10*3/uL (ref 4.0–10.5)

## 2015-03-16 NOTE — ED Notes (Signed)
Pt states that she has had dizziness and weakness for 10 months; pt has been seen in the past for the same thing but doesn't feel like she has been told the truth; pt states that she has been told multiple things and is unsure what is going on

## 2015-03-16 NOTE — ED Notes (Signed)
Bed: WTR5 Expected date:  Expected time:  Means of arrival:  Comments: 

## 2015-03-17 DIAGNOSIS — A499 Bacterial infection, unspecified: Secondary | ICD-10-CM | POA: Diagnosis not present

## 2015-03-17 DIAGNOSIS — R42 Dizziness and giddiness: Secondary | ICD-10-CM | POA: Diagnosis not present

## 2015-03-17 DIAGNOSIS — F310 Bipolar disorder, current episode hypomanic: Secondary | ICD-10-CM | POA: Diagnosis not present

## 2015-03-17 DIAGNOSIS — F419 Anxiety disorder, unspecified: Secondary | ICD-10-CM

## 2015-03-17 DIAGNOSIS — J189 Pneumonia, unspecified organism: Secondary | ICD-10-CM

## 2015-03-17 DIAGNOSIS — Z1612 Extended spectrum beta lactamase (ESBL) resistance: Secondary | ICD-10-CM

## 2015-03-17 DIAGNOSIS — F319 Bipolar disorder, unspecified: Secondary | ICD-10-CM | POA: Diagnosis present

## 2015-03-17 DIAGNOSIS — R531 Weakness: Secondary | ICD-10-CM | POA: Diagnosis present

## 2015-03-17 DIAGNOSIS — Z79899 Other long term (current) drug therapy: Secondary | ICD-10-CM | POA: Diagnosis not present

## 2015-03-17 DIAGNOSIS — E669 Obesity, unspecified: Secondary | ICD-10-CM | POA: Diagnosis present

## 2015-03-17 DIAGNOSIS — N390 Urinary tract infection, site not specified: Secondary | ICD-10-CM | POA: Diagnosis not present

## 2015-03-17 DIAGNOSIS — Z87440 Personal history of urinary (tract) infections: Secondary | ICD-10-CM | POA: Diagnosis not present

## 2015-03-17 DIAGNOSIS — F1910 Other psychoactive substance abuse, uncomplicated: Secondary | ICD-10-CM | POA: Diagnosis not present

## 2015-03-17 DIAGNOSIS — E538 Deficiency of other specified B group vitamins: Secondary | ICD-10-CM | POA: Diagnosis present

## 2015-03-17 DIAGNOSIS — B9620 Unspecified Escherichia coli [E. coli] as the cause of diseases classified elsewhere: Secondary | ICD-10-CM | POA: Diagnosis present

## 2015-03-17 DIAGNOSIS — Z6841 Body Mass Index (BMI) 40.0 and over, adult: Secondary | ICD-10-CM | POA: Diagnosis not present

## 2015-03-17 HISTORY — DX: Pneumonia, unspecified organism: J18.9

## 2015-03-17 LAB — I-STAT CG4 LACTIC ACID, ED: Lactic Acid, Venous: 1.78 mmol/L (ref 0.5–2.0)

## 2015-03-17 LAB — GLUCOSE, CAPILLARY: Glucose-Capillary: 99 mg/dL (ref 65–99)

## 2015-03-17 MED ORDER — ACETAMINOPHEN 325 MG PO TABS: 650.0000 mg | ORAL_TABLET | Freq: Four times a day (QID) | ORAL | Status: DC | PRN

## 2015-03-17 MED ORDER — SODIUM CHLORIDE 0.9 % IV BOLUS (SEPSIS)
1000.0000 mL | Freq: Once | INTRAVENOUS | Status: AC
  Administered 2015-03-17: 1000 mL via INTRAVENOUS

## 2015-03-17 MED ORDER — SODIUM CHLORIDE 0.9 % IJ SOLN: 3.0000 mL | Freq: Two times a day (BID) | INTRAMUSCULAR | Status: DC

## 2015-03-17 MED ORDER — SODIUM CHLORIDE 0.9 % IV SOLN
500.0000 mg | Freq: Four times a day (QID) | INTRAVENOUS | Status: DC
  Administered 2015-03-17 (×3): 500 mg via INTRAVENOUS
  Filled 2015-03-17 (×4): qty 500

## 2015-03-17 MED ORDER — VITAMIN B-1 100 MG PO TABS
100.0000 mg | ORAL_TABLET | Freq: Every day | ORAL | Status: DC
  Administered 2015-03-17: 100 mg via ORAL
  Filled 2015-03-17: qty 1

## 2015-03-17 MED ORDER — QUETIAPINE FUMARATE 200 MG PO TABS: 200.0000 mg | ORAL_TABLET | Freq: Every day | ORAL | Status: DC

## 2015-03-17 MED ORDER — QUETIAPINE FUMARATE 100 MG PO TABS
100.0000 mg | ORAL_TABLET | Freq: Every day | ORAL | Status: DC
  Filled 2015-03-17: qty 1

## 2015-03-17 MED ORDER — ONDANSETRON HCL 4 MG/2ML IJ SOLN: 4.0000 mg | Freq: Four times a day (QID) | INTRAMUSCULAR | Status: DC | PRN

## 2015-03-17 MED ORDER — ENOXAPARIN SODIUM 40 MG/0.4ML ~~LOC~~ SOLN
40.0000 mg | SUBCUTANEOUS | Status: DC
  Administered 2015-03-17: 40 mg via SUBCUTANEOUS
  Filled 2015-03-17: qty 0.4

## 2015-03-17 MED ORDER — ACETAMINOPHEN 650 MG RE SUPP: 650.0000 mg | Freq: Four times a day (QID) | RECTAL | Status: DC | PRN

## 2015-03-17 MED ORDER — FOLIC ACID 1 MG PO TABS
1.0000 mg | ORAL_TABLET | Freq: Every day | ORAL | Status: DC
  Administered 2015-03-17: 1 mg via ORAL
  Filled 2015-03-17: qty 1

## 2015-03-17 MED ORDER — ONDANSETRON HCL 4 MG PO TABS: 4.0000 mg | ORAL_TABLET | Freq: Four times a day (QID) | ORAL | Status: DC | PRN

## 2015-03-17 MED ORDER — ALPRAZOLAM 0.5 MG PO TABS: 0.5000 mg | ORAL_TABLET | Freq: Three times a day (TID) | ORAL | Status: DC | PRN

## 2015-03-17 MED ORDER — SODIUM CHLORIDE 0.9 % IV SOLN
INTRAVENOUS | Status: DC
  Administered 2015-03-17 (×2): via INTRAVENOUS

## 2015-03-17 MED ORDER — LORAZEPAM 2 MG/ML IJ SOLN: 1.0000 mg | Freq: Once | INTRAMUSCULAR | Status: DC | PRN

## 2015-03-17 MED ORDER — PAROXETINE HCL ER 25 MG PO TB24
25.0000 mg | ORAL_TABLET | Freq: Every day | ORAL | Status: DC
  Administered 2015-03-17: 25 mg via ORAL
  Filled 2015-03-17: qty 1

## 2015-03-17 NOTE — Progress Notes (Signed)
PT Cancellation Note  Patient Details Name: Seirra Kos MRN: [id number] DOB: [birth year]   Cancelled Treatment:    Reason Eval/Treat Not Completed: Patient declined, (states she has to take baby steps, that she only walks a very short distance PTA,. will check back later as schedule allows.)   Claretha Cooper 03/17/2015, 9:51 AM Tresa Endo PT [phone]

## 2015-03-17 NOTE — Progress Notes (Signed)
ANTIBIOTIC CONSULT NOTE - INITIAL  Pharmacy Consult for Primaxin Indication: ESBL infection  Allergies  Allergen Reactions  . Tramadol Anaphylaxis, Shortness Of Breath and Swelling  . Sulfa Antibiotics Swelling  . Citalopram     Possible cause of pancytopenia  . Lamotrigine     Possible cause of pancytopenia    Patient Measurements: Weight: (!) 313 lb 0.9 oz (142 kg) Adjusted Body Weight:   Vital Signs: Temp: 98.3 F (36.8 C) (05/26 2008) Temp Source: Oral (05/26 2008) BP: 111/56 mmHg (05/27 0009) Pulse Rate: 111 (05/27 0009) Intake/Output from previous day:   Intake/Output from this shift:    Labs:  Recent Labs  03/16/15 2102  WBC 4.9  HGB 13.0  PLT 208  CREATININE 0.54   Estimated Creatinine Clearance: 131.9 mL/min (by C-G formula based on Cr of 0.54). No results for input(s): VANCOTROUGH, VANCOPEAK, VANCORANDOM, GENTTROUGH, GENTPEAK, GENTRANDOM, TOBRATROUGH, TOBRAPEAK, TOBRARND, AMIKACINPEAK, AMIKACINTROU, AMIKACIN in the last 72 hours.   Microbiology: Recent Results (from the past 720 hour(s))  Urine culture     Status: None   Collection Time: 02/18/15 12:08 PM  Result Value Ref Range Status   Specimen Description URINE, CLEAN CATCH  Final   Special Requests NONE  Final   Colony Count   Final    >=100,000 COLONIES/ML Performed at [facility]    Culture   Final    ESCHERICHIA COLI Note: Confirmed Extended Spectrum Beta-Lactamase Producer (ESBL) Note: CRITICAL RESULT CALLED TO, READ BACK BY AND VERIFIED WITH: WHITNEY 5/2 AT 1605 BY DUNNJ Performed at [facility]    Report Status 02/20/2015 FINAL  Final   Organism ID, Bacteria ESCHERICHIA COLI  Final      Susceptibility   Escherichia coli - MIC*    AMPICILLIN >=32 RESISTANT Resistant     CEFAZOLIN >=64 RESISTANT Resistant     CEFTRIAXONE >=64 RESISTANT Resistant     CIPROFLOXACIN >=4 RESISTANT Resistant     GENTAMICIN <=1 SENSITIVE Sensitive     LEVOFLOXACIN >=8 RESISTANT  Resistant     NITROFURANTOIN <=16 SENSITIVE Sensitive     TOBRAMYCIN >=16 RESISTANT Resistant     TRIMETH/SULFA >=320 RESISTANT Resistant     IMIPENEM <=0.25 SENSITIVE Sensitive     PIP/TAZO 8 SENSITIVE Sensitive     * ESCHERICHIA COLI  Culture, blood (routine x 2)     Status: None   Collection Time: 02/18/15  1:30 PM  Result Value Ref Range Status   Specimen Description BLOOD RIGHT ARM  Final   Special Requests   Final    BOTTLES DRAWN AEROBIC AND ANAEROBIC 10CC BLUE 5CC RED   Culture   Final    NO GROWTH 5 DAYS Performed at [facility]    Report Status 02/24/2015 FINAL  Final  Culture, blood (routine x 2)     Status: None   Collection Time: 02/18/15  1:34 PM  Result Value Ref Range Status   Specimen Description BLOOD RIGHT HAND  Final   Special Requests BOTTLES DRAWN AEROBIC ONLY 10CC  Final   Culture   Final    DIPHTHEROIDS(CORYNEBACTERIUM SPECIES) Note: Standardized susceptibility testing for this organism is not available. Note: Gram Stain Report Called to,Read Back By and Verified With: [facility] K@ 1537 ON [id number] BY [facility] Performed at [facility]    Report Status 02/24/2015 FINAL  Final  MRSA PCR Screening     Status: None   Collection Time: 02/18/15  2:18 PM  Result Value Ref Range Status  MRSA by PCR NEGATIVE NEGATIVE Final    Comment:        The GeneXpert MRSA Assay (FDA approved for NASAL specimens only), is one component of a comprehensive MRSA colonization surveillance program. It is not intended to diagnose MRSA infection nor to guide or monitor treatment for MRSA infections.   Clostridium Difficile by PCR     Status: None   Collection Time: 02/19/15 10:18 AM  Result Value Ref Range Status   C difficile by pcr NEGATIVE NEGATIVE Final  Urine culture     Status: None   Collection Time: 03/02/15  8:02 PM  Result Value Ref Range Status   Specimen Description URINE, CLEAN CATCH  Final   Special Requests NONE  Final   Colony Count    Final    >=100,000 COLONIES/ML Performed at [facility]    Culture   Final    ESCHERICHIA COLI Note: Confirmed Extended Spectrum Beta-Lactamase Producer (ESBL) Performed at [facility]    Report Status 03/05/2015 FINAL  Final   Organism ID, Bacteria ESCHERICHIA COLI  Final      Susceptibility   Escherichia coli - MIC*    AMPICILLIN >=32 RESISTANT Resistant     CEFAZOLIN >=64 RESISTANT Resistant     CEFTRIAXONE RESISTANT      CIPROFLOXACIN >=4 RESISTANT Resistant     GENTAMICIN <=1 SENSITIVE Sensitive     LEVOFLOXACIN >=8 RESISTANT Resistant     NITROFURANTOIN <=16 SENSITIVE Sensitive     TOBRAMYCIN >=16 RESISTANT Resistant     TRIMETH/SULFA >=320 RESISTANT Resistant     IMIPENEM <=0.25 SENSITIVE Sensitive     PIP/TAZO 8 SENSITIVE Sensitive     * ESCHERICHIA COLI  Clostridium Difficile by PCR     Status: None   Collection Time: 03/03/15  1:00 PM  Result Value Ref Range Status   C difficile by pcr NEGATIVE NEGATIVE Final    Medical History: Past Medical History  Diagnosis Date  . Mental disorder   . Bipolar 1 disorder   . Bipolar 1 disorder   . Yeast infection of the vagina   . Bipolar disorder   . Obesity   . UTI (lower urinary tract infection)   . Alcohol abuse   . Shortness of breath dyspnea   . Anemia   . 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  Started therapy in May 2016 but left AMA before complete.  Goal of Therapy:  Primaxin dosed based on patient weight and renal function   Plan:  Follow up culture results  Primaxin 500mg  iv q6hr  Tyler Deis, Shea Stakes [place] 03/17/2015,2:47 AM

## 2015-03-17 NOTE — Progress Notes (Signed)
[facility]  Patient Status: Active (receiving services up to time of hospitalization)  [facility] is providing the following services: RN and MSW  If patient discharges after hours, please call [phone].   Jordan [name] 03/17/2015, 9:51 AM

## 2015-03-17 NOTE — Consult Note (Addendum)
    [place] [facility]     Reason for Consult: ?UTI    Referring Physician: Dr. Erlinda Hong  Principal Problem:   UTI (lower urinary tract infection) Active Problems:   Bipolar disorder   Dizziness   ESBL (extended spectrum beta-lactamase) producing bacteria infection   CAP (community acquired pneumonia)   . enoxaparin (LOVENOX) injection  40 mg Subcutaneous Q24H  . folic acid  1 mg Oral Daily  . imipenem-cilastatin  500 mg Intravenous 4 times per day  . PARoxetine  25 mg Oral Daily  . QUEtiapine  100 mg Oral QHS  . sodium chloride  3 mL Intravenous Q12H  . thiamine  100 mg Oral Daily    Recommendations: Stop antibiotics   Assessment: She has no dysuria, increase WBC or fever or other urinary symptoms.  No indication for treatment.  Ongoing CNS symptoms related to ?anxiety, ? Medications.     Antibiotics: imipenem  HPI: Jordan [name] is a 49 y.o. female with bipolar dz, multiple recent hospitalizations for orthostasis, pancytopenia, weakness and abdominal pain of no identified etiology.  She has been treated for presumed UTIs mulitple times though symptoms typically have been dizziness and 'smell' in her urine without identified UTI symptoms.  She has received many courses of antibiotics and now had a UA done with WBCs and urine culture with ESBL E coli.  Currently she also has no dysuria, pyuria, leukocytosis or fever but was started on imipenem for presumed UTI.  History is difficult due to somewhat pressured speech and continuous concern with her ongoing symptoms, pt continually voicing her frustration.  Also with noted pancytopenia in March possibly due to Lamictal, now resolved.    Review of Systems: Review of systems not obtained due to patient factors.  Past Medical History  Diagnosis Date  . Mental disorder   . Bipolar 1 disorder   . Bipolar 1 disorder   . Yeast infection of the vagina   . Bipolar disorder   . Obesity   . UTI (lower urinary tract  infection)   . Alcohol abuse   . Shortness of breath dyspnea   . Anemia   . 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 Smoking status: Never Smoker   . Smokeless tobacco: Never Used  . Alcohol Use: No    No family history on file. Allergies  Allergen Reactions  . Tramadol Anaphylaxis, Shortness Of Breath and Swelling  . Sulfa Antibiotics Swelling  . Citalopram     Possible cause of pancytopenia  . Lamotrigine     Possible cause of pancytopenia    OBJECTIVE: Blood pressure 100/59, pulse 88, temperature 98.3 F (36.8 C), temperature source Oral, resp. rate 20, height 5\' 8"  (1.727 m), weight 306 lb 7 oz (139 kg), last menstrual period 09/13/2014, SpO2 97 %. General: awake, agitated, nad Skin:  No rashes Lungs: CTA Abdomen: soft   Microbiology: No results found for this or any previous visit (from the past 240 hour(s)).  Scharlene Gloss, [place] [facility] [email or web address] [phone] pager  [phone] cell 03/17/2015, 3:25 PM

## 2015-03-17 NOTE — Progress Notes (Signed)
Performed orthostatic vital signs again per patient request Laying - 113/67 HR - 85 Sitting - 100/61 hr - 95 staning - 135/102 hr- 102 Unable to perform standing for 3 min, d/t patient not being able to tolerate standing (c/o weakness, shakiness)  Soren Lazarz M. Brigitte Pulse, RN

## 2015-03-17 NOTE — Progress Notes (Addendum)
Patient is seen and examined, patient is very anxious, does have pressed speech and tangential thought process. Patient reported has been feeling dizzy for the last 75month, denies tinnitus, no room spinning. She has orthostatic hypotension on admission, better with ivf. Will check am cortisol level. Continue abx.  b12 deficiency could also explained the dizziness, a/p dialy b12 injection, now on weekly injection. Cbc improved compare to before.  lft elevation, imaging does showed fatty liver, will get fractionated bili in am.   she reported that she has been having ongoing ab pain, urine odor for the last 10 month.  Explained to her b12 deficiency could contribute to weakness and dizzy, she need to continue to b12 supplement and need to see dr gBeryle Beamsfor final bone marrow biopsy result. Her Ct of head is negative. She will get physical therapy eval here as well. Also explained to patient that her urine bacteria could be colonization, infectious disease recommend d/c abx for now. Patient does not accept these answered, crying and very anxious.   Patient is very update about not able to finding out the "answers" why she is weak and dizzy, she want to go over what's being done and why repeatedly but is not satisfied with the answers.  Psych consulted for anxiety and mood instability.  Formal infectious disease consulted for further recommendations.  Prolonged encounter  From 2pm to 2:40pm.

## 2015-03-17 NOTE — H&P (Signed)
[facility] History and Physical  Patient: Jordan [name]  MRN: [id number]  DOB: 1966-10-08  DOS: the patient was seen and examined on 03/17/2015 PCP: [name], FNP  Referring physician: Dr. Kathrynn Humble Chief Complaint: Dizziness  HPI: Jordan [name] is a 49 y.o. female with Past medical history of bipolar disorder, obesity, recurrent UTI. The patient is presenting with numbness of dizziness. The dizziness has been ongoing for last 10 months. The patient was recently admitted for sepsis secondary to ESBL UTI and she left AMA. She was also found to be having B-12 deficiency. Patient mentions that her dizziness is primarily when she is trying to walk around. Denies any fever or chills denies any nausea or vomiting. Denies any increased urinary frequency or diarrhea. Denies any vertigo. Denies any blurring of the vision. The chest pain or shortness of breath, palpitation or abdominal pain.  The patient is coming from home. And at her baseline independent for most of her ADL.  Review of Systems: as mentioned in the history of present illness.  A comprehensive review of the other systems is negative.  Past Medical History  Diagnosis Date  . Mental disorder   . Bipolar 1 disorder   . Bipolar 1 disorder   . Yeast infection of the vagina   . Bipolar disorder   . Obesity   . UTI (lower urinary tract infection)   . Alcohol abuse   . Shortness of breath dyspnea   . Anemia   . 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 Cholecystectomy     Social History:  reports that she has never smoked. She has never used smokeless tobacco. She reports that she does not drink alcohol or use illicit drugs.  Allergies  Allergen Reactions  . Tramadol Anaphylaxis, Shortness Of Breath and Swelling  . Sulfa Antibiotics Swelling  . Citalopram     Possible cause of pancytopenia  . Lamotrigine     Possible cause of  pancytopenia    No family history on file.  Prior to Admission medications   Medication Sig Start Date End Date Taking? Authorizing Provider  ALPRAZolam Duanne Moron) 0.5 MG tablet Take 1 tablet (0.5 mg total) by mouth 3 (three) times daily as needed for anxiety. 02/23/15  Yes Shanker Kristeen Mans, MD  cyanocobalamin (,VITAMIN B-12,) 1000 MCG/ML injection Inject subcutaneously q Daily till 02/27/15,then, Inject subcutaneously q weekly for 4 weeks from 02/28/15, Then from 03/29/15-inject subcutaneously q monthly and stay on it 02/23/15  Yes Shanker Kristeen Mans, MD  folic acid (FOLVITE) 1 MG tablet Take 1 tablet (1 mg total) by mouth daily. 02/23/15  Yes Shanker Kristeen Mans, MD  PARoxetine (PAXIL-CR) 25 MG 24 hr tablet Take 1 tablet (25 mg total) by mouth daily. 02/23/15  Yes Shanker Kristeen Mans, MD  QUEtiapine (SEROQUEL) 100 MG tablet Take 1 tablet (100 mg total) by mouth at bedtime. 02/23/15  Yes Shanker Kristeen Mans, MD  thiamine 100 MG tablet Take 1 tablet (100 mg total) by mouth daily. 02/23/15  Yes Shanker Kristeen Mans, MD  Thiamine HCl (VITAMIN B-1) 250 MG tablet Take 250 mg by mouth daily.   Yes Historical Provider, MD    Physical Exam: Filed Vitals:   03/17/15 0009 03/17/15 0200 03/17/15 0341 03/17/15 0415  BP: 111/56  113/82 100/59  Pulse: 111  87 88  Temp:    98.3 F (36.8 C)  TempSrc:      Resp: 14  16  20  Height:    5\' 8"  (1.727 m)  Weight:  142 kg (313 lb 0.9 oz)  139 kg (306 lb 7 oz)  SpO2: 100%  95% 97%    General: Alert, Awake and Oriented to Time, Place and Person. Appear in mild distress Eyes: PERRL ENT: Oral Mucosa clear moist. Neck: no JVD Cardiovascular: S1 and S2 Present, no Murmur, Peripheral Pulses Present Respiratory: Bilateral Air entry equal and Decreased,  Clear to Auscultation, no Crackles, no wheezes Abdomen: Bowel Sound present, Soft and non tender Skin: no Rash Extremities: Bilateral Pedal edema, no calf tenderness Neurologic: Grossly no focal neuro deficit.  Labs on Admission:    CBC:  Recent Labs Lab 03/16/15 2102  WBC 4.9  HGB 13.0  HCT 41.2  MCV 96.5  PLT 208    CMP     Component Value Date/Time   NA 139 03/16/2015 2102   K 4.1 03/16/2015 2102   CL 107 03/16/2015 2102   CO2 22 03/16/2015 2102   GLUCOSE 109* 03/16/2015 2102   BUN 6 03/16/2015 2102   CREATININE 0.54 03/16/2015 2102   CALCIUM 8.8* 03/16/2015 2102   PROT 5.0* 03/03/2015 0500   ALBUMIN 3.0* 03/03/2015 0500   AST 62* 03/03/2015 0500   ALT 23 03/03/2015 0500   ALKPHOS 76 03/03/2015 0500   BILITOT 3.7* 03/03/2015 0500   GFRNONAA >60 03/16/2015 2102   GFRAA >60 03/16/2015 2102    No results for input(s): LIPASE, AMYLASE in the last 168 hours.  No results for input(s): CKTOTAL, CKMB, CKMBINDEX, TROPONINI in the last 168 hours. BNP (last 3 results)  Recent Labs  03/03/15 0028  BNP 31.7    ProBNP (last 3 results)  Recent Labs  05/10/14 1047  PROBNP 173.6*     Radiological Exams on Admission: Dg Chest 2 View  03/16/2015   CLINICAL DATA:  Dizziness and weakness for the past 10 months.  EXAM: CHEST  2 VIEW  COMPARISON:  03/05/2015.  FINDINGS: Poor inspiration. Mild right basilar atelectasis with improvement. Clear left lung. Normal sized heart. Mild diffuse peribronchial thickening. Unremarkable bones. Cholecystectomy clips.  IMPRESSION: 1. Poor inspiration with mild right basal atelectasis with improvement. 2. Mild bronchitic changes with progression.   Electronically Signed   By: Claudie Revering M.D.   On: 03/16/2015 20:54   EKG: Independently reviewed. sinus tachycardia.  Assessment/Plan Principal Problem:   UTI (lower urinary tract infection) Active Problems:   Bipolar disorder   Dizziness   ESBL (extended spectrum beta-lactamase) producing bacteria infection   CAP (community acquired pneumonia)   1. UTI (lower urinary tract infection) The patient is presenting with complaints of dizziness and lightheadedness. She has history of ESBL UTI. Which has not been  treated completely secondary to her leaving AMA. Currently she will be treated with Primaxin. We need to discuss with ID about continuation of the antibodies as it appears to be a colonizer rather than an acute infection.  2. Bipolar disorder. Continue home medications. Many of the home medications can be responsible for her recurrent dizziness.  3. Recent healthcare pneumonia. The patient was hospitalized and was started on broad-spectrum antibodies. The patient left AMA. Currently the patient is back again on Primaxin. Continue monitoring. Cultures remain negative.  4. Dizziness. Probably orthostatic. Etiology unclear. Next and probably poor oral intake. Continue IV hydration. Recheck orthostatic vitals.  5. B-12 deficiency. Patient has received because recently. Continue monitoring.  Advance goals of care discussion: Full code   DVT Prophylaxis: subcutaneous Heparin Nutrition:  Regular diet  Disposition: Admitted as inpatient, telemetry unit.  Author: Berle Mull, MD [facility] Pager: [phone] 03/17/2015  If 7PM-7AM, please contact night-coverage [email or web address] Password [email or web address]

## 2015-03-17 NOTE — ED Notes (Signed)
Pt declining antianxiety medication at this time.

## 2015-03-17 NOTE — Hospital Discharge Follow-Up (Signed)
Transitional Care Clinic:  This Transitional Care Coordinator spoke with Dessa Phi, RN Case Manager who indicated patient may benefit from close follow-up at the [facility] Met with patient at bedside to discuss the post-discharge services and medical management provided at the [facility].  Patient not interested in the [facility]. She indicates she has a PCP at [facility], and she has walk-in access at clinic and is able to obtain needed post-discharge follow-up.  Updated Dessa Phi, RN on conversation with patient.

## 2015-03-17 NOTE — Progress Notes (Signed)
Patient expressing desire to sign out AMA because she states that she won't have a ride tomorrow.  Explained risk of leaving AMA including the fact that insurance will not pay for medical expenses.  Notified MD of patient's desire to leave.

## 2015-03-17 NOTE — ED Provider Notes (Signed)
CSN: [id number]     Arrival date & time 03/16/15  2006 History   First MD Initiated Contact with Patient 03/17/15 0103     Chief Complaint  Patient presents with  . Weakness     (Consider location/radiation/quality/duration/timing/severity/associated sxs/prior Treatment) HPI Comments: Pt w/ hx of bipolar disorder comes in with cc of weakness. She has hx of ESBL UTI and was admitted for sepsis from ESBL and HCAP recently. She left AMA whilst getting iv antibiotics - because she just got very nervous and had no idea what was going on. Pt comes in to the ER because of worsening weakness. Reports dizziness as well. No fevers at home. Pt has persistent cough, productive. No chills, no fevers, no abd pain. + dib, no current chest pain.   ROS 10 Systems reviewed and are negative for acute change except as noted in the HPI.     Patient is a 49 y.o. female presenting with weakness. The history is provided by the patient.  Weakness    Past Medical History  Diagnosis Date  . Mental disorder   . Bipolar 1 disorder   . Bipolar 1 disorder   . Yeast infection of the vagina   . Bipolar disorder   . Obesity   . UTI (lower urinary tract infection)   . Alcohol abuse   . Shortness of breath dyspnea   . Anemia   . 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 Cholecystectomy     No family history on file. History  Substance Use Topics  . Smoking status: Never Smoker   . Smokeless tobacco: Never Used  . Alcohol Use: No   OB History    Gravida Para Term Preterm AB TAB SAB Ectopic Multiple Living   1 1 1       1      Review of Systems  Respiratory: Positive for cough.   Neurological: Positive for weakness.  All other systems reviewed and are negative.     Allergies  Tramadol; Sulfa antibiotics; Citalopram; and Lamotrigine  Home Medications   Prior to Admission medications   Medication Sig Start Date End  Date Taking? Authorizing Provider  ALPRAZolam Duanne Moron) 0.5 MG tablet Take 1 tablet (0.5 mg total) by mouth 3 (three) times daily as needed for anxiety. 02/23/15  Yes Shanker Kristeen Mans, MD  cyanocobalamin (,VITAMIN B-12,) 1000 MCG/ML injection Inject subcutaneously q Daily till 02/27/15,then, Inject subcutaneously q weekly for 4 weeks from 02/28/15, Then from 03/29/15-inject subcutaneously q monthly and stay on it 02/23/15  Yes Shanker Kristeen Mans, MD  folic acid (FOLVITE) 1 MG tablet Take 1 tablet (1 mg total) by mouth daily. 02/23/15  Yes Shanker Kristeen Mans, MD  PARoxetine (PAXIL-CR) 25 MG 24 hr tablet Take 1 tablet (25 mg total) by mouth daily. 02/23/15  Yes Shanker Kristeen Mans, MD  QUEtiapine (SEROQUEL) 100 MG tablet Take 1 tablet (100 mg total) by mouth at bedtime. 02/23/15  Yes Shanker Kristeen Mans, MD  thiamine 100 MG tablet Take 1 tablet (100 mg total) by mouth daily. 02/23/15  Yes Shanker Kristeen Mans, MD  Thiamine HCl (VITAMIN B-1) 250 MG tablet Take 250 mg by mouth daily.   Yes Historical Provider, MD   BP 111/56 mmHg  Pulse 111  Temp(Src) 98.3 F (36.8 C) (Oral)  Resp 14  Wt 313 lb 0.9 oz (142 kg)  SpO2 100%  LMP 09/13/2014 Physical Exam  Constitutional: She is  oriented to person, place, and time. She appears well-developed and well-nourished.  HENT:  Head: Normocephalic and atraumatic.  Eyes: EOM are normal. Pupils are equal, round, and reactive to light.  Neck: Neck supple.  Cardiovascular: Regular rhythm and normal heart sounds.   Pulmonary/Chest: Effort normal. No respiratory distress. She has rales.  Diffuse bibasilar rales with some R sided rhonchi  Abdominal: Soft. She exhibits no distension. There is no tenderness. There is no rebound and no guarding.  Neurological: She is alert and oriented to person, place, and time.  Skin: Skin is warm and dry.  Nursing note and vitals reviewed.   ED Course  Procedures (including critical care time) Labs Review Labs Reviewed  CBC - Abnormal; Notable  for the following:    RDW 20.4 (*)    All other components within normal limits  BASIC METABOLIC PANEL - Abnormal; Notable for the following:    Glucose, Bld 109 (*)    Calcium 8.8 (*)    All other components within normal limits  URINALYSIS, ROUTINE W REFLEX MICROSCOPIC (NOT AT [facility]) - Abnormal; Notable for the following:    Color, Urine AMBER (*)    APPearance CLOUDY (*)    Bilirubin Urine SMALL (*)    Nitrite POSITIVE (*)    Leukocytes, UA MODERATE (*)    All other components within normal limits  URINE MICROSCOPIC-ADD ON - Abnormal; Notable for the following:    Squamous Epithelial / LPF FEW (*)    Bacteria, UA MANY (*)    All other components within normal limits  CULTURE, BLOOD (ROUTINE X 2)  CULTURE, BLOOD (ROUTINE X 2)  I-STAT TROPOININ, ED  I-STAT CG4 LACTIC ACID, ED    Imaging Review Dg Chest 2 View  03/16/2015   CLINICAL DATA:  Dizziness and weakness for the past 10 months.  EXAM: CHEST  2 VIEW  COMPARISON:  03/05/2015.  FINDINGS: Poor inspiration. Mild right basilar atelectasis with improvement. Clear left lung. Normal sized heart. Mild diffuse peribronchial thickening. Unremarkable bones. Cholecystectomy clips.  IMPRESSION: 1. Poor inspiration with mild right basal atelectasis with improvement. 2. Mild bronchitic changes with progression.   Electronically Signed   By: Claudie Revering M.D.   On: 03/16/2015 20:54     EKG Interpretation   Date/Time:  Thursday Mar 16 2015 20:17:45 EDT Ventricular Rate:  108 PR Interval:  139 QRS Duration: 79 QT Interval:  353 QTC Calculation: 473 R Axis:   58 Text Interpretation:  Sinus tachycardia Abnormal R-wave progression, early  transition No significant change since last tracing Confirmed by KNAPP   MD-J, JON ([id number]) on 03/16/2015 8:30:15 PM      MDM   Final diagnoses:  ESBL (extended spectrum beta-lactamase) producing bacteria infection  UTI (lower urinary tract infection)    Pt with hx of ESBL UTI who left AMA in midst  of her ivab w/o any oral meds comes in with cc of weakness. She has + UTI. She is tachycardic - no other SIRs criteria right now. CXR is clear -pt has HCAP hx, but she has persistent cough and has some rhonchus breath sounds. Pt also has bipolar, and really cant explain me why she left AMA in first place with a reasonable explanation. Will get blood cultures, start antibiotics and admit.     Varney Biles, MD 03/17/15 [phone]

## 2015-03-17 NOTE — Care Management Note (Signed)
Case Management Note  Patient Details  Name: Jordan [name] MRN: [id number] Date of Birth: 05-26-1966  Subjective/Objective: 49 y/o f admitted w/UTI.Readmit 5/12-5/15/16.From home w/[facility].CSW cons.[facility] will screen.                    Action/Plan: continue to monitor progress & d/c needs.   Expected Discharge Date:                  Expected Discharge Plan:  [place]  In-House Referral:  Clinical Social Work  Discharge planning Services  CM Consult  Post Acute Care Choice:   (Active w/[facility]-HHRN/HH social worker-[facility] states patient's home is unsafe;bed bugs.) Choice offered to:     DME Arranged:    DME Agency:     HH Arranged:    HH Agency:     Status of Service:  In process, will continue to follow  Medicare Important Message Given:    Date Medicare IM Given:    Medicare IM give by:    Date Additional Medicare IM Given:    Additional Medicare Important Message give by:     If discussed at [place] of Stay Meetings, dates discussed:    Additional Comments:  Dessa Phi, RN 03/17/2015, 4:04 PM

## 2015-03-17 NOTE — Consult Note (Signed)
[name] Face-to-Face Psychiatry Consult   Reason for Consult:  Bipolar disorder medication management Referring Physician:  DR. Xu Patient Identification: Jordan [name] MRN:  [id number] Principal Diagnosis: UTI (lower urinary tract infection) Diagnosis:   Patient Active Problem List   Diagnosis Date Noted  . ESBL (extended spectrum beta-lactamase) producing bacteria infection [A49.9, Z16.12] 03/17/2015  . CAP (community acquired pneumonia) [J18.9] 03/17/2015  . Pneumonia [J18.9] 03/03/2015  . Diarrhea [R19.7] 03/03/2015  . UTI (lower urinary tract infection) [N39.0] 03/02/2015  . Weakness [R53.1] 03/02/2015  . 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 Generalized anxiety disorder [F41.1] 02/20/2015  . Claustrophobia [F40.240] 02/20/2015  . Sepsis due to Gram negative bacteria (MDR E Coli) [A41.50] 02/18/2015  . Transaminitis [R74.0] 02/18/2015  . Chest pain [R07.9] 02/18/2015  . Sepsis [A41.9] 02/18/2015  . Pancytopenia [D61.818]   . Abdominal pain [R10.9]   . Other pancytopenia [D61.818] 01/05/2015  . Dizziness [R42]   . MDR E. coli UTI [N39.0, B96.20]   . Hypotension [I95.9] 01/04/2015  . UTI (urinary tract infection) [N39.0] 01/04/2015  . Bipolar disorder [F31.9] 06/29/2012  . LGSIL (low grade squamous intraepithelial lesion) on Pap smear [R89.6] 06/29/2012    Total Time spent with patient: 1 hour  Subjective:   Jordan [name] is a 49 y.o. female patient admitted with dizziness and UTI x 10 months and partially treatment. She has B-12 def and happy to receive medication while in the hospital. Psychiatric consultation requested for assessment of mental health status and medication management of bipolar disorder. Patient is hesitant to talk to this provider because she has hard time to understand the line of work a Optometrist will do instead of explaining. She has more concrete thought process. She stated that " I have learning disorder and had special education and can not read  well".   She has periods of talking herself, low self esteem and questions why herself " Why am I a looser and stupid". She satisfied with the response her BF gives to her that "It is an act of God" and she has no control on it etc. She has distractibility, tangential and circumstantial and mild pressure speech. She has no grandiosity, agitation and aggression. She has no suicide or homicide ideation, intention or plans.   Past psych history: She has been diagnosed with bipolar disorder and has been on medication treatment from Dr. Hoy Register, at [facility]. She has been happy with the service.  Social history: She lives with her BF, Merry Proud who has been supportive to each other. She has daughter who was graduated and working part time. Patient says she believes in tough love, says no to her when asked financial support to pay for deposit for apartment.  Medical history:  Jordan [name] is a 49 y.o. female with Past medical history of bipolar disorder, obesity, recurrent UTI.The patient is presenting with numbness of dizziness. The dizziness has been ongoing for last 10 months.The patient was recently admitted for sepsis secondary to ESBL UTI and she left AMA. She was also found to be having B-12 deficiency. Patient mentions that her dizziness is primarily when she is trying to walk around.  HPI Elements:   Location:  bipolar . Quality:  fair. Severity:  unable to care for medical condition. Timing:  frequent UTI. Duration:  ten months. Context:  psychocial stresses..  Past Medical History:  Past Medical History  Diagnosis Date  . Mental disorder   . Bipolar 1 disorder   . Bipolar 1 disorder   .  Yeast infection of the vagina   . Bipolar disorder   . Obesity   . UTI (lower urinary tract infection)   . Alcohol abuse   . Shortness of breath dyspnea   . Anemia   . 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  Cholecystectomy     Family History: No family history on file. Social History:  History  Alcohol Use No     History  Drug Use No    History   Social History  . Marital Status: Single    Spouse Name: N/A  . Number of Children: N/A  . Years of Education: N/A   Social History Main Topics  . Smoking status: Never Smoker   . Smokeless tobacco: Never Used  . Alcohol Use: No  . Drug Use: No  . Sexual Activity: Yes    Birth Control/ Protection: None   Other Topics Concern  . None   Social History Narrative   Additional Social History:                          Allergies:   Allergies  Allergen Reactions  . Tramadol Anaphylaxis, Shortness Of Breath and Swelling  . Sulfa Antibiotics Swelling  . Citalopram     Possible cause of pancytopenia  . Lamotrigine     Possible cause of pancytopenia    Labs:  Results for orders placed or performed during the hospital encounter of 03/16/15 (from the past 48 hour(s))  I-stat troponin, ED  (not at [facility], [facility])     Status: None   Collection Time: 03/16/15  8:53 PM  Result Value Ref Range   Troponin i, poc 0.00 0.00 - 0.08 ng/mL   Comment 3            Comment: Due to the release kinetics of cTnI, a negative result within the first hours of the onset of symptoms does not rule out myocardial infarction with certainty. If myocardial infarction is still suspected, repeat the test at appropriate intervals.   Urinalysis, Routine w reflex microscopic     Status: Abnormal   Collection Time: 03/16/15  8:57 PM  Result Value Ref Range   Color, Urine AMBER (A) YELLOW    Comment: BIOCHEMICALS MAY BE AFFECTED BY COLOR   APPearance CLOUDY (A) CLEAR   Specific Gravity, Urine 1.020 1.005 - 1.030   pH 6.0 5.0 - 8.0   Glucose, UA NEGATIVE NEGATIVE mg/dL   Hgb urine dipstick NEGATIVE NEGATIVE   Bilirubin Urine SMALL (A) NEGATIVE   Ketones, ur NEGATIVE NEGATIVE mg/dL   Protein, ur NEGATIVE NEGATIVE mg/dL   Urobilinogen, UA 1.0 0.0 -  1.0 mg/dL   Nitrite POSITIVE (A) NEGATIVE   Leukocytes, UA MODERATE (A) NEGATIVE  Urine microscopic-add on     Status: Abnormal   Collection Time: 03/16/15  8:57 PM  Result Value Ref Range   Squamous Epithelial / LPF FEW (A) RARE   WBC, UA 21-50 <3 WBC/hpf   Bacteria, UA MANY (A) RARE  CBC     Status: Abnormal   Collection Time: 03/16/15  9:02 PM  Result Value Ref Range   WBC 4.9 4.0 - 10.5 K/uL   RBC 4.27 3.87 - 5.11 MIL/uL   Hemoglobin 13.0 12.0 - 15.0 g/dL   HCT 41.2 36.0 - 46.0 %   MCV 96.5 78.0 - 100.0 fL   MCH 30.4 26.0 -  34.0 pg   MCHC 31.6 30.0 - 36.0 g/dL   RDW 20.4 (H) 11.5 - 15.5 %   Platelets 208 150 - 400 K/uL  Basic metabolic panel     Status: Abnormal   Collection Time: 03/16/15  9:02 PM  Result Value Ref Range   Sodium 139 135 - 145 mmol/L   Potassium 4.1 3.5 - 5.1 mmol/L   Chloride 107 101 - 111 mmol/L   CO2 22 22 - 32 mmol/L   Glucose, Bld 109 (H) 65 - 99 mg/dL   BUN 6 6 - 20 mg/dL   Creatinine, Ser 0.54 0.44 - 1.00 mg/dL   Calcium 8.8 (L) 8.9 - 10.3 mg/dL   GFR calc non Af Amer >60 >60 mL/min   GFR calc Af Amer >60 >60 mL/min    Comment: (NOTE) The eGFR has been calculated using the CKD EPI equation. This calculation has not been validated in all clinical situations. eGFR's persistently <60 mL/min signify possible Chronic Kidney Disease.    Anion gap 10 5 - 15  I-Stat CG4 Lactic Acid, ED     Status: None   Collection Time: 03/17/15  3:46 AM  Result Value Ref Range   Lactic Acid, Venous 1.78 0.5 - 2.0 mmol/L  Glucose, capillary     Status: None   Collection Time: 03/17/15  9:16 AM  Result Value Ref Range   Glucose-Capillary 99 65 - 99 mg/dL    Vitals: Blood pressure 100/59, pulse 88, temperature 98.3 F (36.8 C), temperature source Oral, resp. rate 20, height 5' 8"  (1.727 m), weight 139 kg (306 lb 7 oz), last menstrual period 09/13/2014, SpO2 97 %.  Risk to Self: Is patient at risk for suicide?: No Risk to Others:   Prior Inpatient Therapy:    Prior Outpatient Therapy:    Current Facility-Administered Medications  Medication Dose Route Frequency Provider Last Rate Last Dose  . 0.9 %  sodium chloride infusion   Intravenous Continuous Lavina Hamman, MD 100 mL/hr at 03/17/15 0544    . acetaminophen (TYLENOL) tablet 650 mg  650 mg Oral Q6H PRN Lavina Hamman, MD       Or  . acetaminophen (TYLENOL) suppository 650 mg  650 mg Rectal Q6H PRN Lavina Hamman, MD      . ALPRAZolam Duanne Moron) tablet 0.5 mg  0.5 mg Oral TID PRN Lavina Hamman, MD      . enoxaparin (LOVENOX) injection 40 mg  40 mg Subcutaneous Q24H Lavina Hamman, MD   40 mg at 03/17/15 0848  . folic acid (FOLVITE) tablet 1 mg  1 mg Oral Daily Lavina Hamman, MD   1 mg at 03/17/15 0847  . ondansetron (ZOFRAN) tablet 4 mg  4 mg Oral Q6H PRN Lavina Hamman, MD       Or  . ondansetron [facility]) injection 4 mg  4 mg Intravenous Q6H PRN Lavina Hamman, MD      . PARoxetine (PAXIL-CR) 24 hr tablet 25 mg  25 mg Oral Daily Lavina Hamman, MD   25 mg at 03/17/15 0847  . QUEtiapine (SEROQUEL) tablet 100 mg  100 mg Oral QHS Lavina Hamman, MD      . sodium chloride 0.9 % injection 3 mL  3 mL Intravenous Q12H Lavina Hamman, MD   3 mL at 03/17/15 0850  . thiamine (VITAMIN B-1) tablet 100 mg  100 mg Oral Daily Lavina Hamman, MD   100 mg at 03/17/15 [phone]  Musculoskeletal: Strength & Muscle Tone: decreased Gait & Station: unable to stand Patient leans: N/A  Psychiatric Specialty Exam: Physical Exam as per history and physical  ROS generalized weakness, numbness, dizziness and mild nausea. Denies any fever or chills denies any nausea or vomiting. Denies any increased urinary frequency or diarrhea. Denies any vertigo. Denies any blurring of the vision. The chest pain or shortness of breath, palpitation or abdominal pain.  Review of Systems: as mentioned in the history of present illness.  A comprehensive review of the other systems is negative   Blood pressure 100/59, pulse 88,  temperature 98.3 F (36.8 C), temperature source Oral, resp. rate 20, height 5' 8"  (1.727 m), weight 139 kg (306 lb 7 oz), last menstrual period 09/13/2014, SpO2 97 %.Body mass index is 46.6 kg/(m^2).  General Appearance: Guarded  Eye Contact::  Fair  Speech:  Pressured  Volume:  Normal  Mood:  Anxious, Depressed and Worthless  Affect:  Constricted and Depressed  Thought Process:  Circumstantial, Loose and Tangential  Orientation:  Full (Time, Place, and Person)  Thought Content:  Rumination  Suicidal Thoughts:  No  Homicidal Thoughts:  No  Memory:  Immediate;   Fair Recent;   Fair  Judgement:  Fair  Insight:  Fair  Psychomotor Activity:  Decreased  Concentration:  Fair  Recall:  [employer] [place]  Language: Fair  Akathisia:  Negative  Handed:  Right  AIMS (if indicated):     Assets:  Desire for Improvement Housing Intimacy Leisure Time Resilience Social Support  ADL's:  Impaired  Cognition: Impaired,  Mild  Sleep:      Medical Decision Making: Review of Psycho-Social Stressors (1), Review or order clinical lab tests (1), Established Problem, Worsening (2), New Problem, with no additional work-up planned (3), Review or order medicine tests (1), Review of Medication Regimen & Side Effects (2) and Review of New Medication or Change in Dosage (2)  Treatment Plan Summary: patient presented with history of bipolar disorder, learning disorder and with poor insight, judgment and impulse control. She has been in and out of hospital visit and signing off [facility] without completing her treatment for UTI. She has concrete thought process, distractibility, rumination, perseverance, tangential and circumstantial and pressured  Speech. She has no suicide or homicide ideations.  Daily contact with patient to assess and evaluate symptoms and progress in treatment and Medication management  Plan: Patient has no safety concerns  Bipolar disorder with hypomania: Seroquel 200 mg at bed  time  Depression: Paxil CR 25 mg  Daily  Anxiety: Alprazolam 0.5 mg TID - does not like to change  Substance abuse : denied active substance abuse  Patient does not meet criteria for psychiatric inpatient admission. Supportive therapy provided about ongoing stressors.   Appreciate psychiatric consultation and sign off at this time Please contact [phone] or [phone] if needs further assistance   Disposition: Pt will be referred to out patient medication management when medically stable.    Shyam Dawson,JANARDHAHA R. 03/17/2015 3:58 PM

## 2015-03-19 LAB — URINE CULTURE: Colony Count: >=100000

## 2015-03-23 LAB — CULTURE, BLOOD (ROUTINE X 2)
Culture: NO GROWTH
Culture: NO GROWTH

## 2015-04-10 ENCOUNTER — Emergency Department (HOSPITAL_COMMUNITY)
Admission: EM | Admit: 2015-04-10 | Discharge: 2015-04-11 | Disposition: A | Discharge status: Home / Self Care | Payer: Medicaid Other | Attending: Emergency Medicine | Admitting: Emergency Medicine

## 2015-04-10 ENCOUNTER — Encounter (HOSPITAL_COMMUNITY): Payer: Self-pay | Admitting: Emergency Medicine

## 2015-04-10 ENCOUNTER — Emergency Department (HOSPITAL_COMMUNITY): Payer: Medicaid Other

## 2015-04-10 DIAGNOSIS — E669 Obesity, unspecified: Secondary | ICD-10-CM | POA: Diagnosis not present

## 2015-04-10 DIAGNOSIS — Z8619 Personal history of other infectious and parasitic diseases: Secondary | ICD-10-CM | POA: Diagnosis not present

## 2015-04-10 DIAGNOSIS — F99 Mental disorder, not otherwise specified: Secondary | ICD-10-CM | POA: Diagnosis not present

## 2015-04-10 DIAGNOSIS — D649 Anemia, unspecified: Secondary | ICD-10-CM | POA: Insufficient documentation

## 2015-04-10 DIAGNOSIS — N898 Other specified noninflammatory disorders of vagina: Secondary | ICD-10-CM | POA: Diagnosis not present

## 2015-04-10 DIAGNOSIS — F419 Anxiety disorder, unspecified: Secondary | ICD-10-CM | POA: Diagnosis not present

## 2015-04-10 DIAGNOSIS — Z79899 Other long term (current) drug therapy: Secondary | ICD-10-CM | POA: Insufficient documentation

## 2015-04-10 DIAGNOSIS — F319 Bipolar disorder, unspecified: Secondary | ICD-10-CM | POA: Diagnosis not present

## 2015-04-10 DIAGNOSIS — N390 Urinary tract infection, site not specified: Secondary | ICD-10-CM

## 2015-04-10 DIAGNOSIS — J209 Acute bronchitis, unspecified: Secondary | ICD-10-CM | POA: Insufficient documentation

## 2015-04-10 DIAGNOSIS — R079 Chest pain, unspecified: Secondary | ICD-10-CM | POA: Diagnosis present

## 2015-04-10 DIAGNOSIS — J40 Bronchitis, not specified as acute or chronic: Secondary | ICD-10-CM

## 2015-04-10 LAB — URINALYSIS, ROUTINE W REFLEX MICROSCOPIC
Bilirubin Urine: NEGATIVE
Glucose, UA: NEGATIVE mg/dL
Hgb urine dipstick: NEGATIVE
Ketones, ur: NEGATIVE mg/dL
Nitrite: NEGATIVE
Protein, ur: NEGATIVE mg/dL
Specific Gravity, Urine: 1.021 (ref 1.005–1.030)
Urobilinogen, UA: 1 mg/dL (ref 0.0–1.0)
pH: 6 (ref 5.0–8.0)

## 2015-04-10 LAB — WET PREP, GENITAL
Clue Cells Wet Prep HPF POC: NONE SEEN
Trich, Wet Prep: NONE SEEN
Yeast Wet Prep HPF POC: NONE SEEN

## 2015-04-10 LAB — URINE MICROSCOPIC-ADD ON

## 2015-04-10 MED ORDER — CEPHALEXIN 500 MG PO CAPS
500.0000 mg | ORAL_CAPSULE | Freq: Four times a day (QID) | ORAL | Status: DC
Start: 2015-04-10 — End: 2015-05-12

## 2015-04-10 MED ORDER — PREDNISONE 20 MG PO TABS: 40.0000 mg | ORAL_TABLET | Freq: Every day | ORAL | Status: DC

## 2015-04-10 MED ORDER — LORAZEPAM 1 MG PO TABS
2.0000 mg | ORAL_TABLET | Freq: Once | ORAL | Status: AC
  Administered 2015-04-10: 2 mg via ORAL
  Filled 2015-04-10: qty 2

## 2015-04-10 MED ORDER — ALBUTEROL SULFATE HFA 108 (90 BASE) MCG/ACT IN AERS
2.0000 | INHALATION_SPRAY | RESPIRATORY_TRACT | Status: DC
  Administered 2015-04-10: 2 via RESPIRATORY_TRACT
  Filled 2015-04-10: qty 6.7

## 2015-04-10 MED ORDER — PREDNISONE 20 MG PO TABS
60.0000 mg | ORAL_TABLET | Freq: Once | ORAL | Status: AC
  Administered 2015-04-10: 60 mg via ORAL
  Filled 2015-04-10: qty 3

## 2015-04-10 NOTE — Discharge Instructions (Signed)
Acute Bronchitis Bronchitis is inflammation of the airways that extend from the windpipe into the lungs (bronchi). The inflammation often causes mucus to develop. This leads to a cough, which is the most common symptom of bronchitis.  In acute bronchitis, the condition usually develops suddenly and goes away over time, usually in a couple weeks. Smoking, allergies, and asthma can make bronchitis worse. Repeated episodes of bronchitis may cause further lung problems.  CAUSES Acute bronchitis is most often caused by the same virus that causes a cold. The virus can spread from person to person (contagious) through coughing, sneezing, and touching contaminated objects. SIGNS AND SYMPTOMS   Cough.   Fever.   Coughing up mucus.   Body aches.   Chest congestion.   Chills.   Shortness of breath.   Sore throat.  DIAGNOSIS  Acute bronchitis is usually diagnosed through a physical exam. Your health care provider will also ask you questions about your medical history. Tests, such as chest X-rays, are sometimes done to rule out other conditions.  TREATMENT  Acute bronchitis usually goes away in a couple weeks. Oftentimes, no medical treatment is necessary. Medicines are sometimes given for relief of fever or cough. Antibiotic medicines are usually not needed but may be prescribed in certain situations. In some cases, an inhaler may be recommended to help reduce shortness of breath and control the cough. A cool mist vaporizer may also be used to help thin bronchial secretions and make it easier to clear the chest.  HOME CARE INSTRUCTIONS  Get plenty of rest.   Drink enough fluids to keep your urine clear or pale yellow (unless you have a medical condition that requires fluid restriction). Increasing fluids may help thin your respiratory secretions (sputum) and reduce chest congestion, and it will prevent dehydration.   Take medicines only as directed by your health care provider.  If  you were prescribed an antibiotic medicine, finish it all even if you start to feel better.  Avoid smoking and secondhand smoke. Exposure to cigarette smoke or irritating chemicals will make bronchitis worse. If you are a smoker, consider using nicotine gum or skin patches to help control withdrawal symptoms. Quitting smoking will help your lungs heal faster.   Reduce the chances of another bout of acute bronchitis by washing your hands frequently, avoiding people with cold symptoms, and trying not to touch your hands to your mouth, nose, or eyes.   Keep all follow-up visits as directed by your health care provider.  SEEK MEDICAL CARE IF: Your symptoms do not improve after 1 week of treatment.  SEEK IMMEDIATE MEDICAL CARE IF:  You develop an increased fever or chills.   You have chest pain.   You have severe shortness of breath.  You have bloody sputum.   You develop dehydration.  You faint or repeatedly feel like you are going to pass out.  You develop repeated vomiting.  You develop a severe headache. MAKE SURE YOU:   Understand these instructions.  Will watch your condition.  Will get help right away if you are not doing well or get worse. Document Released: 11/14/2004 Document Revised: 02/21/2014 Document Reviewed: 03/30/2013 [facility] Patient Information 2015 [place], [place]. This information is not intended to replace advice given to you by your health care provider. Make sure you discuss any questions you have with your health care provider. Urinary Tract Infection Urinary tract infections (UTIs) can develop anywhere along your urinary tract. Your urinary tract is your body's drainage system for removing  wastes and extra water. Your urinary tract includes two kidneys, two ureters, a bladder, and a urethra. Your kidneys are a pair of bean-shaped organs. Each kidney is about the size of your fist. They are located below your ribs, one on each side of your  spine. CAUSES Infections are caused by microbes, which are microscopic organisms, including fungi, viruses, and bacteria. These organisms are so small that they can only be seen through a microscope. Bacteria are the microbes that most commonly cause UTIs. SYMPTOMS  Symptoms of UTIs may vary by age and gender of the patient and by the location of the infection. Symptoms in young women typically include a frequent and intense urge to urinate and a painful, burning feeling in the bladder or urethra during urination. Older women and men are more likely to be tired, shaky, and weak and have muscle aches and abdominal pain. A fever may mean the infection is in your kidneys. Other symptoms of a kidney infection include pain in your back or sides below the ribs, nausea, and vomiting. DIAGNOSIS To diagnose a UTI, your caregiver will ask you about your symptoms. Your caregiver also will ask to provide a urine sample. The urine sample will be tested for bacteria and white blood cells. White blood cells are made by your body to help fight infection. TREATMENT  Typically, UTIs can be treated with medication. Because most UTIs are caused by a bacterial infection, they usually can be treated with the use of antibiotics. The choice of antibiotic and length of treatment depend on your symptoms and the type of bacteria causing your infection. HOME CARE INSTRUCTIONS  If you were prescribed antibiotics, take them exactly as your caregiver instructs you. Finish the medication even if you feel better after you have only taken some of the medication.  Drink enough water and fluids to keep your urine clear or pale yellow.  Avoid caffeine, tea, and carbonated beverages. They tend to irritate your bladder.  Empty your bladder often. Avoid holding urine for long periods of time.  Empty your bladder before and after sexual intercourse.  After a bowel movement, women should cleanse from front to back. Use each tissue only  once. SEEK MEDICAL CARE IF:   You have back pain.  You develop a fever.  Your symptoms do not begin to resolve within 3 days. SEEK IMMEDIATE MEDICAL CARE IF:   You have severe back pain or lower abdominal pain.  You develop chills.  You have nausea or vomiting.  You have continued burning or discomfort with urination. MAKE SURE YOU:   Understand these instructions.  Will watch your condition.  Will get help right away if you are not doing well or get worse. Document Released: 07/17/2005 Document Revised: 04/07/2012 Document Reviewed: 11/15/2011 [facility] Patient Information 2015 [place], [place]. This information is not intended to replace advice given to you by your health care provider. Make sure you discuss any questions you have with your health care provider.

## 2015-04-10 NOTE — ED Provider Notes (Signed)
CSN: [id number]     Arrival date & time 04/10/15  2115 History   First MD Initiated Contact with Patient 04/10/15 2126     No chief complaint on file.    (Consider location/radiation/quality/duration/timing/severity/associated sxs/prior Treatment) HPI Comments: Patient complaining of 5 months of chronic right-sided chest pain is worse with movement and without anginal qualities. Denies any CHF quality to his symptoms. Patient recently seen by her physician and diagnosed with pneumonia and placed metabolic. Denies any recent fever, vomiting, diarrhea. No sick. Nursing to be. Pain is sharp and is relieved when she is still. Denies any pleuritic component. No leg pain or swelling. Patient has a history of bipolar disorder and does so more anxious. Stenosis secondary complaint of having vaginal discharge 5 months. No bleeding noted. Denies any dysuria. No treatment used prior to arrival.  The history is provided by the patient.    Past Medical History  Diagnosis Date  . Mental disorder   . Bipolar 1 disorder   . Bipolar 1 disorder   . Yeast infection of the vagina   . Bipolar disorder   . Obesity   . UTI (lower urinary tract infection)   . Alcohol abuse   . Shortness of breath dyspnea   . Anemia   . 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 Cholecystectomy     No family history on file. History  Substance Use Topics  . Smoking status: Never Smoker   . Smokeless tobacco: Never Used  . Alcohol Use: No   OB History    Gravida Para Term Preterm AB TAB SAB Ectopic Multiple Living   1 1 1       1      Review of Systems  All other systems reviewed and are negative.     Allergies  Tramadol; Sulfa antibiotics; Citalopram; and Lamotrigine  Home Medications   Prior to Admission medications   Medication Sig Start Date End Date Taking? Authorizing Provider  ALPRAZolam Duanne Moron) 0.5 MG tablet Take 1 tablet (0.5  mg total) by mouth 3 (three) times daily as needed for anxiety. 02/23/15   Shanker Kristeen Mans, MD  cyanocobalamin (,VITAMIN B-12,) 1000 MCG/ML injection Inject subcutaneously q Daily till 02/27/15,then, Inject subcutaneously q weekly for 4 weeks from 02/28/15, Then from 03/29/15-inject subcutaneously q monthly and stay on it 02/23/15   Shanker Kristeen Mans, MD  folic acid (FOLVITE) 1 MG tablet Take 1 tablet (1 mg total) by mouth daily. 02/23/15   Shanker Kristeen Mans, MD  PARoxetine (PAXIL-CR) 25 MG 24 hr tablet Take 1 tablet (25 mg total) by mouth daily. 02/23/15   Shanker Kristeen Mans, MD  QUEtiapine (SEROQUEL) 100 MG tablet Take 1 tablet (100 mg total) by mouth at bedtime. 02/23/15   Shanker Kristeen Mans, MD  thiamine 100 MG tablet Take 1 tablet (100 mg total) by mouth daily. 02/23/15   Shanker Kristeen Mans, MD  Thiamine HCl (VITAMIN B-1) 250 MG tablet Take 250 mg by mouth daily.    Historical Provider, MD   BP 105/68 mmHg  Pulse 102  Temp(Src) 98.4 F (36.9 C) (Oral)  Resp 19  SpO2 97%  LMP 09/13/2014 Physical Exam  Constitutional: She is oriented to person, place, and time. She appears well-developed and well-nourished.  Non-toxic appearance. No distress.  HENT:  Head: Normocephalic and atraumatic.  Eyes: Conjunctivae, EOM and lids are normal. Pupils are equal, round, and reactive to light.  Neck:  Normal range of motion. Neck supple. No tracheal deviation present. No thyroid mass present.  Cardiovascular: Normal rate, regular rhythm and normal heart sounds.  Exam reveals no gallop.   No murmur heard. Pulmonary/Chest: Effort normal and breath sounds normal. No stridor. No respiratory distress. She has no decreased breath sounds. She has no wheezes. She has no rhonchi. She has no rales. She exhibits tenderness and bony tenderness. She exhibits no crepitus.  Abdominal: Soft. Normal appearance and bowel sounds are normal. She exhibits no distension. There is no tenderness. There is no rebound and no CVA tenderness.   Genitourinary: There is no rash on the right labia. There is no rash on the left labia. Vaginal discharge found.  Musculoskeletal: Normal range of motion. She exhibits no edema or tenderness.  Neurological: She is alert and oriented to person, place, and time. She has normal strength. No cranial nerve deficit or sensory deficit. GCS eye subscore is 4. GCS verbal subscore is 5. GCS motor subscore is 6.  Skin: Skin is warm and dry. No abrasion and no rash noted.  Psychiatric: Her speech is normal and behavior is normal. Her mood appears anxious.  Nursing note and vitals reviewed.   ED Course  Procedures (including critical care time) Labs Review Labs Reviewed  WET PREP, GENITAL - Abnormal; Notable for the following:    WBC, Wet Prep HPF POC TOO NUMEROUS TO COUNT (*)    All other components within normal limits  URINE CULTURE  URINALYSIS, ROUTINE W REFLEX MICROSCOPIC (NOT AT [facility])  GC/CHLAMYDIA PROBE AMP ([place]) NOT AT [facility]    Imaging Review Dg Chest 2 View  04/10/2015   CLINICAL DATA:  Chest pain for 1 day  EXAM: CHEST  2 VIEW  COMPARISON:  03/16/2015  FINDINGS: Mild peribronchial thickening. Heart and mediastinal contours are within normal limits. No focal opacities or effusions. No acute bony abnormality.  IMPRESSION: Mild bronchitic changes.   Electronically Signed   By: Rolm Baptise M.D.   On: 04/10/2015 22:13     EKG Interpretation   Date/Time:  Monday April 10 2015 21:30:26 EDT Ventricular Rate:  113 PR Interval:  163 QRS Duration: 80 QT Interval:  355 QTC Calculation: 487 R Axis:   55 Text Interpretation:  Sinus tachycardia Abnormal R-wave progression, early  transition Borderline prolonged QT interval No significant change since  last tracing Confirmed by Kadir Azucena  MD, Emry Tobin ([id number]) on 04/10/2015 10:49:55  PM      MDM   Final diagnoses:  Chest pain    Patient to be treated for UTI and bronchitis.  Lacretia Leigh, MD 04/10/15 2300

## 2015-04-10 NOTE — ED Notes (Signed)
Pt c/o chest pain with cough x's 5-6 months.  Also c/o foul smelling vag. Discharge with itching

## 2015-04-11 LAB — GC/CHLAMYDIA PROBE AMP (~~LOC~~) NOT AT ARMC
Chlamydia: NEGATIVE
Neisseria Gonorrhea: NEGATIVE

## 2015-04-13 LAB — URINE CULTURE: Culture: >=100000

## 2015-04-14 NOTE — Progress Notes (Signed)
ED Antimicrobial Stewardship Positive Culture Follow Up   Jordan [name] is an 49 y.o. female who presented to [facility] on 04/10/2015 with a chief complaint of  Chief Complaint  Patient presents with  . Chest Pain    Recent Results (from the past 720 hour(s))  Culture, Urine     Status: None   Collection Time: 03/16/15  9:07 PM  Result Value Ref Range Status   Specimen Description URINE, RANDOM  Final   Special Requests NONE  Final   Colony Count   Final    >=100,000 COLONIES/ML Performed at [facility]    Culture   Final    ESCHERICHIA COLI Note: Confirmed Extended Spectrum Beta-Lactamase Producer (ESBL) Performed at [facility]    Report Status 03/19/2015 FINAL  Final   Organism ID, Bacteria ESCHERICHIA COLI  Final      Susceptibility   Escherichia coli - MIC*    AMPICILLIN >=32 RESISTANT Resistant     CEFAZOLIN >=64 RESISTANT Resistant     CEFTRIAXONE >=64 RESISTANT Resistant     CIPROFLOXACIN >=4 RESISTANT Resistant     GENTAMICIN <=1 SENSITIVE Sensitive     LEVOFLOXACIN >=8 RESISTANT Resistant     NITROFURANTOIN <=16 SENSITIVE Sensitive     TOBRAMYCIN >=16 RESISTANT Resistant     TRIMETH/SULFA >=320 RESISTANT Resistant     IMIPENEM <=0.25 SENSITIVE Sensitive     PIP/TAZO 8 SENSITIVE Sensitive     * ESCHERICHIA COLI  Blood culture (routine x 2)     Status: None   Collection Time: 03/17/15  3:36 AM  Result Value Ref Range Status   Specimen Description BLOOD RIGHT ANTECUBITAL  Final   Special Requests BOTTLES DRAWN AEROBIC AND ANAEROBIC 7ML  Final   Culture   Final    NO GROWTH 5 DAYS Performed at [facility]    Report Status 03/23/2015 FINAL  Final  Blood culture (routine x 2)     Status: None   Collection Time: 03/17/15  3:36 AM  Result Value Ref Range Status   Specimen Description BLOOD LEFT ANTECUBITAL  Final   Special Requests   Final    BOTTLES DRAWN AEROBIC AND ANAEROBIC 5ML ANA 8ML AER   Culture   Final    NO GROWTH 5  DAYS Performed at [facility]    Report Status 03/23/2015 FINAL  Final  Wet prep, genital     Status: Abnormal   Collection Time: 04/10/15  9:55 PM  Result Value Ref Range Status   Yeast Wet Prep HPF POC NONE SEEN NONE SEEN Final   Trich, Wet Prep NONE SEEN NONE SEEN Final   Clue Cells Wet Prep HPF POC NONE SEEN NONE SEEN Final   WBC, Wet Prep HPF POC TOO NUMEROUS TO COUNT (A) NONE SEEN Final  Urine culture     Status: None   Collection Time: 04/10/15  9:58 PM  Result Value Ref Range Status   Specimen Description URINE, CLEAN CATCH  Final   Special Requests NONE  Final   Culture   Final    >=100,000 COLONIES/mL ESCHERICHIA COLI Confirmed Extended Spectrum Beta-Lactamase Producer (ESBL)    Report Status 04/13/2015 FINAL  Final   Organism ID, Bacteria ESCHERICHIA COLI  Final      Susceptibility   Escherichia coli - MIC*    AMPICILLIN >=32 RESISTANT Resistant     CEFAZOLIN >=64 RESISTANT Resistant     CEFTRIAXONE 8 RESISTANT Resistant     CIPROFLOXACIN >=4 RESISTANT  Resistant     GENTAMICIN <=1 SENSITIVE Sensitive     IMIPENEM <=0.25 SENSITIVE Sensitive     NITROFURANTOIN <=16 SENSITIVE Sensitive     TRIMETH/SULFA >=320 RESISTANT Resistant     AMPICILLIN/SULBACTAM >=32 RESISTANT Resistant     PIP/TAZO 8 SENSITIVE Sensitive     * >=100,000 COLONIES/mL ESCHERICHIA COLI    [x]  Treated with Cephalexin, organism resistant to prescribed antimicrobial   New antibiotic prescription: Macrobid 100mg  PO BID x 7 days.  She should continue Keflex for her bronchitis.  ED Provider: Comer Locket, PA-C   Norva Riffle 04/14/2015, 11:37 AM Infectious Diseases Pharmacist Phone# [phone]

## 2015-04-15 ENCOUNTER — Telehealth: Payer: Self-pay | Admitting: Emergency Medicine

## 2015-04-16 ENCOUNTER — Telehealth: Payer: Self-pay | Admitting: Emergency Medicine

## 2015-04-16 ENCOUNTER — Emergency Department (HOSPITAL_COMMUNITY)
Admission: EM | Admit: 2015-04-16 | Discharge: 2015-04-16 | Disposition: A | Discharge status: Home / Self Care | Payer: Medicaid Other | Attending: Emergency Medicine | Admitting: Emergency Medicine

## 2015-04-16 ENCOUNTER — Emergency Department (HOSPITAL_COMMUNITY): Payer: Medicaid Other

## 2015-04-16 ENCOUNTER — Encounter (HOSPITAL_COMMUNITY): Payer: Self-pay | Admitting: Emergency Medicine

## 2015-04-16 DIAGNOSIS — D531 Other megaloblastic anemias, not elsewhere classified: Secondary | ICD-10-CM | POA: Diagnosis not present

## 2015-04-16 DIAGNOSIS — Z792 Long term (current) use of antibiotics: Secondary | ICD-10-CM | POA: Insufficient documentation

## 2015-04-16 DIAGNOSIS — F99 Mental disorder, not otherwise specified: Secondary | ICD-10-CM | POA: Diagnosis not present

## 2015-04-16 DIAGNOSIS — Z8619 Personal history of other infectious and parasitic diseases: Secondary | ICD-10-CM | POA: Insufficient documentation

## 2015-04-16 DIAGNOSIS — J40 Bronchitis, not specified as acute or chronic: Secondary | ICD-10-CM | POA: Diagnosis not present

## 2015-04-16 DIAGNOSIS — F319 Bipolar disorder, unspecified: Secondary | ICD-10-CM | POA: Insufficient documentation

## 2015-04-16 DIAGNOSIS — Z87440 Personal history of urinary (tract) infections: Secondary | ICD-10-CM | POA: Insufficient documentation

## 2015-04-16 DIAGNOSIS — Z79899 Other long term (current) drug therapy: Secondary | ICD-10-CM | POA: Diagnosis not present

## 2015-04-16 DIAGNOSIS — R05 Cough: Secondary | ICD-10-CM

## 2015-04-16 DIAGNOSIS — E669 Obesity, unspecified: Secondary | ICD-10-CM | POA: Diagnosis not present

## 2015-04-16 DIAGNOSIS — R059 Cough, unspecified: Secondary | ICD-10-CM

## 2015-04-16 MED ORDER — SODIUM CHLORIDE 0.9 % IV BOLUS (SEPSIS): 500.0000 mL | Freq: Once | INTRAVENOUS | Status: DC

## 2015-04-16 NOTE — ED Notes (Signed)
Pt also complaining of abd pain and difficulty walking due to fatigue- these symptoms have been persistent for the past 12 months according to the patient

## 2015-04-16 NOTE — ED Notes (Signed)
Pt taken to radiology

## 2015-04-16 NOTE — ED Notes (Addendum)
Walked PT using a 2 x person; Assisted PT to door way of room. PT feeling dizzy and unstable, used wheelchair to take PT to restroom. Stayed with PT while in restroom ([facility]). Assisted PT back to room using wheelchair and then back into PT bed.

## 2015-04-16 NOTE — Discharge Instructions (Signed)
Cough, Adult  A cough is a reflex. It helps you clear your throat and airways. A cough can help heal your body. A cough can last 2 or 3 weeks (acute) or may last more than 8 weeks (chronic). Some common causes of a cough can include an infection, allergy, or a cold. HOME CARE  Only take medicine as told by your doctor.  If given, take your medicines (antibiotics) as told. Finish them even if you start to feel better.  Use a cold steam vaporizer or humidifier in your home. This can help loosen thick spit (secretions).  Sleep so you are almost sitting up (semi-upright). Use pillows to do this. This helps reduce coughing.  Rest as needed.  Stop smoking if you smoke. GET HELP RIGHT AWAY IF:  You have yellowish-white fluid (pus) in your thick spit.  Your cough gets worse.  Your medicine does not reduce coughing, and you are losing sleep.  You cough up blood.  You have trouble breathing.  Your pain gets worse and medicine does not help.  You have a fever. MAKE SURE YOU:   Understand these instructions.  Will watch your condition.  Will get help right away if you are not doing well or get worse. Document Released: 06/20/2011 Document Revised: 02/21/2014 Document Reviewed: 06/20/2011 [facility] Patient Information 2015 [place], [place]. This information is not intended to replace advice given to you by your health care provider. Make sure you discuss any questions you have with your health care provider.  Acute Bronchitis Bronchitis is inflammation of the airways that extend from the windpipe into the lungs (bronchi). The inflammation often causes mucus to develop. This leads to a cough, which is the most common symptom of bronchitis.  In acute bronchitis, the condition usually develops suddenly and goes away over time, usually in a couple weeks. Smoking, allergies, and asthma can make bronchitis worse. Repeated episodes of bronchitis may cause further lung problems.  CAUSES Acute  bronchitis is most often caused by the same virus that causes a cold. The virus can spread from person to person (contagious) through coughing, sneezing, and touching contaminated objects. SIGNS AND SYMPTOMS   Cough.   Fever.   Coughing up mucus.   Body aches.   Chest congestion.   Chills.   Shortness of breath.   Sore throat.  DIAGNOSIS  Acute bronchitis is usually diagnosed through a physical exam. Your health care provider will also ask you questions about your medical history. Tests, such as chest X-rays, are sometimes done to rule out other conditions.  TREATMENT  Acute bronchitis usually goes away in a couple weeks. Oftentimes, no medical treatment is necessary. Medicines are sometimes given for relief of fever or cough. Antibiotic medicines are usually not needed but may be prescribed in certain situations. In some cases, an inhaler may be recommended to help reduce shortness of breath and control the cough. A cool mist vaporizer may also be used to help thin bronchial secretions and make it easier to clear the chest.  HOME CARE INSTRUCTIONS  Get plenty of rest.   Drink enough fluids to keep your urine clear or pale yellow (unless you have a medical condition that requires fluid restriction). Increasing fluids may help thin your respiratory secretions (sputum) and reduce chest congestion, and it will prevent dehydration.   Take medicines only as directed by your health care provider.  If you were prescribed an antibiotic medicine, finish it all even if you start to feel better.  Avoid smoking  and secondhand smoke. Exposure to cigarette smoke or irritating chemicals will make bronchitis worse. If you are a smoker, consider using nicotine gum or skin patches to help control withdrawal symptoms. Quitting smoking will help your lungs heal faster.   Reduce the chances of another bout of acute bronchitis by washing your hands frequently, avoiding people with cold  symptoms, and trying not to touch your hands to your mouth, nose, or eyes.   Keep all follow-up visits as directed by your health care provider.  SEEK MEDICAL CARE IF: Your symptoms do not improve after 1 week of treatment.  SEEK IMMEDIATE MEDICAL CARE IF:  You develop an increased fever or chills.   You have chest pain.   You have severe shortness of breath.  You have bloody sputum.   You develop dehydration.  You faint or repeatedly feel like you are going to pass out.  You develop repeated vomiting.  You develop a severe headache. MAKE SURE YOU:   Understand these instructions.  Will watch your condition.  Will get help right away if you are not doing well or get worse. Document Released: 11/14/2004 Document Revised: 02/21/2014 Document Reviewed: 03/30/2013 [facility] Patient Information 2015 [place], [place]. This information is not intended to replace advice given to you by your health care provider. Make sure you discuss any questions you have with your health care provider.

## 2015-04-16 NOTE — ED Provider Notes (Signed)
CSN: [id number]     Arrival date & time 04/16/15  1845 History   First MD Initiated Contact with Patient 04/16/15 1855     Chief Complaint  Patient presents with  . Cough     (Consider location/radiation/quality/duration/timing/severity/associated sxs/prior Treatment) Patient is a 49 y.o. female presenting with cough.  Cough Cough characteristics:  Non-productive Severity:  Moderate Onset quality:  Gradual Duration: 1 yr. Timing:  Constant Progression:  Waxing and waning Chronicity:  Chronic Smoker: yes   Context: not animal exposure, not exposure to allergens, not occupational exposure, not sick contacts, not smoke exposure, not upper respiratory infection, not weather changes and not with activity   Relieved by:  Nothing Worsened by:  Nothing tried Ineffective treatments:  Beta-agonist inhaler Associated symptoms: no chest pain, no chills, no diaphoresis, no ear fullness, no ear pain, no eye discharge, no fever, no headaches, no myalgias, no rash, no rhinorrhea, no shortness of breath, no sinus congestion, no sore throat, no weight loss and no wheezing     Past Medical History  Diagnosis Date  . Mental disorder   . Bipolar 1 disorder   . Bipolar 1 disorder   . Yeast infection of the vagina   . Bipolar disorder   . Obesity   . UTI (lower urinary tract infection)   . Alcohol abuse   . Shortness of breath dyspnea   . Anemia   . 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 Cholecystectomy     History reviewed. No pertinent family history. History  Substance Use Topics  . Smoking status: Never Smoker   . Smokeless tobacco: Never Used  . Alcohol Use: No   OB History    Gravida Para Term Preterm AB TAB SAB Ectopic Multiple Living   1 1 1       1      Review of Systems  Constitutional: Negative for fever, chills, weight loss, diaphoresis, appetite change and fatigue.  HENT: Negative for congestion,  ear pain, facial swelling, mouth sores, rhinorrhea and sore throat.   Eyes: Negative for discharge and visual disturbance.  Respiratory: Positive for cough. Negative for chest tightness, shortness of breath and wheezing.   Cardiovascular: Negative for chest pain and palpitations.  Gastrointestinal: Negative for nausea, vomiting, abdominal pain, diarrhea and blood in stool.  Endocrine: Negative for cold intolerance and heat intolerance.  Genitourinary: Negative for frequency, decreased urine volume and difficulty urinating.  Musculoskeletal: Negative for myalgias, back pain and neck stiffness.  Skin: Negative for rash.  Neurological: Negative for dizziness, weakness, light-headedness and headaches.      Allergies  Tramadol; Sulfa antibiotics; Citalopram; and Lamotrigine  Home Medications   Prior to Admission medications   Medication Sig Start Date End Date Taking? Authorizing Provider  ALPRAZolam Duanne Moron) 0.5 MG tablet Take 1 tablet (0.5 mg total) by mouth 3 (three) times daily as needed for anxiety. 02/23/15   Shanker Kristeen Mans, MD  cephALEXin (KEFLEX) 500 MG capsule Take 1 capsule (500 mg total) by mouth 4 (four) times daily. 04/10/15   Lacretia Leigh, MD  cyanocobalamin (,VITAMIN B-12,) 1000 MCG/ML injection Inject subcutaneously q Daily till 02/27/15,then, Inject subcutaneously q weekly for 4 weeks from 02/28/15, Then from 03/29/15-inject subcutaneously q monthly and stay on it 02/23/15   Shanker Kristeen Mans, MD  folic acid (FOLVITE) 1 MG tablet Take 1 tablet (1 mg total) by mouth daily. 02/23/15   Shanker Kristeen Mans, MD  PARoxetine (PAXIL-CR) 25 MG 24 hr tablet Take 1 tablet (25 mg total) by mouth daily. 02/23/15   Shanker Kristeen Mans, MD  predniSONE (DELTASONE) 20 MG tablet Take 2 tablets (40 mg total) by mouth daily. 04/10/15   Lacretia Leigh, MD  QUEtiapine (SEROQUEL) 100 MG tablet Take 1 tablet (100 mg total) by mouth at bedtime. 02/23/15   Shanker Kristeen Mans, MD  thiamine 100 MG tablet Take 1 tablet  (100 mg total) by mouth daily. 02/23/15   Shanker Kristeen Mans, MD  Thiamine HCl (VITAMIN B-1) 250 MG tablet Take 250 mg by mouth daily.    Historical Provider, MD   BP 147/79 mmHg  Pulse 114  Temp(Src) 97.9 F (36.6 C) (Oral)  Resp 22  Ht 5\' 8"  (1.727 m)  Wt 305 lb (138.347 kg)  BMI 46.39 kg/m2  SpO2 99%  LMP 09/13/2014 Physical Exam  Constitutional: She is oriented to person, place, and time. She appears well-developed and well-nourished. No distress.  HENT:  Head: Normocephalic and atraumatic.  Right Ear: External ear normal.  Left Ear: External ear normal.  Nose: Nose normal.  Eyes: Conjunctivae and EOM are normal. Pupils are equal, round, and reactive to light. Right eye exhibits no discharge. Left eye exhibits no discharge. No scleral icterus.  Neck: Normal range of motion. Neck supple.  Cardiovascular: Normal rate, regular rhythm and normal heart sounds.  Exam reveals no gallop and no friction rub.   No murmur heard. Pulmonary/Chest: Effort normal and breath sounds normal. No stridor. No respiratory distress. She has no wheezes.  Abdominal: Soft. She exhibits no distension. There is no tenderness.  Musculoskeletal: She exhibits no edema or tenderness.  Neurological: She is alert and oriented to person, place, and time.  Skin: Skin is warm and dry. No rash noted. She is not diaphoretic. No erythema.  Psychiatric: She has a normal mood and affect.    ED Course  Procedures (including critical care time) Labs Review Labs Reviewed - No data to display  Imaging Review Dg Chest 2 View  04/16/2015   CLINICAL DATA:  Cough.  EXAM: CHEST  2 VIEW  COMPARISON:  04/10/2015  FINDINGS: Lung volumes are low. Progressive bronchial thickening. The cardiomediastinal contours are normal. Pulmonary vasculature is normal. No consolidation, pleural effusion, or pneumothorax. No acute osseous abnormalities are seen.  IMPRESSION: Progressive bronchial thickening, suspect bronchitis.   Electronically  Signed   By: Jeb Levering M.D.   On: 04/16/2015 20:47     EKG Interpretation None      MDM   49 year old female with a past medical history listed above presents with chronic cough for one year seen here earlier this week for the same diagnosed with bronchitis. No changes in her symptoms. No fevers. Chest x-ray with no evidence of superimposed pneumonia. Exam without evidence of volume overload. Chest x-ray without pulmonary edema. Low concern for CHF exacerbation.   Patient is stable for discharge and given strict return precautions. Patient follow-up with her PCP as scheduled for this week. Patient also instructed to follow-up with her psychiatrist for a scheduled appointment this Wednesday.  She was seen in conjunction with Dr. Ashok Cordia.  Final diagnoses:  Cough  Bronchitis        Addison Lank, MD 04/16/15 5701  Lajean Saver, MD 04/16/15 [phone]

## 2015-04-16 NOTE — ED Notes (Signed)
Taken to restroom

## 2015-04-16 NOTE — ED Notes (Signed)
Pt complaining of coughing and wheezing for the past 12 months. Pt has albuterol inhaler but states it does not help. Pt states she has been offered placement in a nursing facility but has declined, but at this time may be more open to it. Pt states she has trouble walking. BP 774 systolic, HR 142 CBG 395

## 2015-04-18 ENCOUNTER — Telehealth (HOSPITAL_BASED_OUTPATIENT_CLINIC_OR_DEPARTMENT_OTHER): Payer: Self-pay | Admitting: Emergency Medicine

## 2015-04-19 DIAGNOSIS — F411 Generalized anxiety disorder: Secondary | ICD-10-CM | POA: Diagnosis not present

## 2015-04-19 DIAGNOSIS — Z2239 Carrier of other specified bacterial diseases: Secondary | ICD-10-CM

## 2015-04-19 DIAGNOSIS — R42 Dizziness and giddiness: Secondary | ICD-10-CM | POA: Diagnosis not present

## 2015-04-19 NOTE — Discharge Summary (Signed)
Discharge Summary  Jordan [name] OAC:[id number] DOB: June 17, 1966  PCP: [name], FNP  Admit date: 03/16/2015 Discharge date: 04/19/2015  Time spent: <89mns  Recommendations for Outpatient Follow-up:  1. Patient signed out [place] 8pm on 5/27  Discharge Diagnoses:  Active Hospital Problems   Diagnosis Date Noted  . Bipolar disorder 06/29/2012  . ESBL (extended spectrum beta-lactamase) producing bacteria infection 03/17/2015  . CAP (community acquired pneumonia) 03/17/2015  . UTI (lower urinary tract infection) 03/02/2015  . Dizziness     Resolved Hospital Problems   Diagnosis Date Noted Date Resolved  No resolved problems to display.    Discharge Condition: stable  Diet recommendation: heart healthy/carb modified  Filed Weights   03/17/15 0200 03/17/15 0415  Weight: 142 kg (313 lb 0.9 oz) 139 kg (306 lb 7 oz)    History of present illness:  RRaysa [name] a 49y.o. female with Past medical history of bipolar disorder, obesity, recurrent UTI. The patient is presenting with numbness of dizziness. The dizziness has been ongoing for last 10 months. The patient was recently admitted for sepsis secondary to ESBL UTI and she left AMA. She was also found to be having B-12 deficiency. Patient mentions that her dizziness is primarily when she is trying to walk around. Denies any fever or chills denies any nausea or vomiting. Denies any increased urinary frequency or diarrhea. Denies any vertigo. Denies any blurring of the vision. The chest pain or shortness of breath, palpitation or abdominal pain.  The patient is coming from home. And at her baseline independent for most of her ADL  Hospital Course:  Principal Problem:   Bipolar disorder Active Problems:   Dizziness   UTI (lower urinary tract infection)   ESBL (extended spectrum beta-lactamase) producing bacteria infection   CAP (community acquired pneumonia)  ESBL colonization The patient is presenting  with complaints of dizziness and lightheadedness, reported this has been chronic for the last year. Denies urinary symptom..Vernia Bufftreated with primaxin initially, Infectious disease consulted, recommend d/c abx.  As this likely colonization rather than an acute infection.  2. Bipolar disorder. Continue home medications. Many of the home medications can be responsible for her recurrent dizziness. Psychiatry consulted, appreciate input.  3. Recent healthcare pneumonia. The patient was hospitalized and was started on broad-spectrum antibodies. The patient left AMA.(h/o signing out AMA multiple times) Currently the patient is back again on Primaxin. Continue monitoring. Cultures remain negative.  4. Dizziness. Chronic, unclear etiology +orthostatic on admission, improved with hydration Etiology unclear. Next and probably poor oral intake. AM cortisol level ordered, however, patient signed out AMA.   5. B-12 deficiency. Patient has received because recently. Continue monitoring.  Advance goals of care discussion: Full code    Procedures:  none  Consultations:  Infectious disease  psychiatry  Discharge Exam: BP 100/59 mmHg  Pulse 88  Temp(Src) 98.3 F (36.8 C) (Oral)  Resp 20  Ht 5' 8"  (1.727 m)  Wt 139 kg (306 lb 7 oz)  BMI 46.60 kg/m2  SpO2 97%  LMP 09/13/2014  General: obese, anxious Cardiovascular: rrr Respiratory: CTABL  Discharge Instructions You were cared for by a hospitalist during your hospital stay. If you have any questions about your discharge medications or the care you received while you were in the hospital after you are discharged, you can call the unit and asked to speak with the hospitalist on call if the hospitalist that took care of you is not available. Once you are discharged, your primary care  physician will handle any further medical issues. Please note that NO REFILLS for any discharge medications will be authorized once you are  discharged, as it is imperative that you return to your primary care physician (or establish a relationship with a primary care physician if you do not have one) for your aftercare needs so that they can reassess your need for medications and monitor your lab values.     Medication List    ASK your doctor about these medications        ALPRAZolam 0.5 MG tablet  Commonly known as:  XANAX  Take 1 tablet (0.5 mg total) by mouth 3 (three) times daily as needed for anxiety.     cyanocobalamin 1000 MCG/ML injection  Commonly known as:  (VITAMIN B-12)  Inject subcutaneously q Daily till 02/27/15,then, Inject subcutaneously q weekly for 4 weeks from 02/28/15, Then from 03/29/15-inject subcutaneously q monthly and stay on it     folic acid 1 MG tablet  Commonly known as:  FOLVITE  Take 1 tablet (1 mg total) by mouth daily.     PARoxetine 25 MG 24 hr tablet  Commonly known as:  PAXIL-CR  Take 1 tablet (25 mg total) by mouth daily.     QUEtiapine 100 MG tablet  Commonly known as:  SEROQUEL  Take 1 tablet (100 mg total) by mouth at bedtime.     vitamin B-1 250 MG tablet  Take 250 mg by mouth 2 (two) times daily.     thiamine 100 MG tablet  Take 1 tablet (100 mg total) by mouth daily.       Allergies  Allergen Reactions  . Tramadol Anaphylaxis, Shortness Of Breath and Swelling  . Sulfa Antibiotics Swelling  . Citalopram     Possible cause of pancytopenia  . Lamotrigine     Possible cause of pancytopenia       Follow-up Information    Follow up with Annia Belt, MD In 3 weeks.   Specialty:  Oncology   Why:  cytopenia, f/u with bone marrow biopsy   Contact information:   [place] [id number] [phone]        The results of significant diagnostics from this hospitalization (including imaging, microbiology, ancillary and laboratory) are listed below for reference.    Significant Diagnostic Studies: Dg Chest 2 View  04/16/2015   CLINICAL DATA:   Cough.  EXAM: CHEST  2 VIEW  COMPARISON:  04/10/2015  FINDINGS: Lung volumes are low. Progressive bronchial thickening. The cardiomediastinal contours are normal. Pulmonary vasculature is normal. No consolidation, pleural effusion, or pneumothorax. No acute osseous abnormalities are seen.  IMPRESSION: Progressive bronchial thickening, suspect bronchitis.   Electronically Signed   By: Jeb Levering M.D.   On: 04/16/2015 20:47   Dg Chest 2 View  04/10/2015   CLINICAL DATA:  Chest pain for 1 day  EXAM: CHEST  2 VIEW  COMPARISON:  03/16/2015  FINDINGS: Mild peribronchial thickening. Heart and mediastinal contours are within normal limits. No focal opacities or effusions. No acute bony abnormality.  IMPRESSION: Mild bronchitic changes.   Electronically Signed   By: Rolm Baptise M.D.   On: 04/10/2015 22:13    Microbiology: Recent Results (from the past 240 hour(s))  Wet prep, genital     Status: Abnormal   Collection Time: 04/10/15  9:55 PM  Result Value Ref Range Status   Yeast Wet Prep HPF POC NONE SEEN NONE SEEN Final   Trich, Wet Prep NONE  SEEN NONE SEEN Final   Clue Cells Wet Prep HPF POC NONE SEEN NONE SEEN Final   WBC, Wet Prep HPF POC TOO NUMEROUS TO COUNT (A) NONE SEEN Final  Urine culture     Status: None   Collection Time: 04/10/15  9:58 PM  Result Value Ref Range Status   Specimen Description URINE, CLEAN CATCH  Final   Special Requests NONE  Final   Culture   Final    >=100,000 COLONIES/mL ESCHERICHIA COLI Confirmed Extended Spectrum Beta-Lactamase Producer (ESBL)    Report Status 04/13/2015 FINAL  Final   Organism ID, Bacteria ESCHERICHIA COLI  Final      Susceptibility   Escherichia coli - MIC*    AMPICILLIN >=32 RESISTANT Resistant     CEFAZOLIN >=64 RESISTANT Resistant     CEFTRIAXONE 8 RESISTANT Resistant     CIPROFLOXACIN >=4 RESISTANT Resistant     GENTAMICIN <=1 SENSITIVE Sensitive     IMIPENEM <=0.25 SENSITIVE Sensitive     NITROFURANTOIN <=16 SENSITIVE Sensitive      TRIMETH/SULFA >=320 RESISTANT Resistant     AMPICILLIN/SULBACTAM >=32 RESISTANT Resistant     PIP/TAZO 8 SENSITIVE Sensitive     * >=100,000 COLONIES/mL ESCHERICHIA COLI     Labs: Basic Metabolic Panel: No results for input(s): NA, K, CL, CO2, GLUCOSE, BUN, CREATININE, CALCIUM, MG, PHOS in the last 168 hours. Liver Function Tests: No results for input(s): AST, ALT, ALKPHOS, BILITOT, PROT, ALBUMIN in the last 168 hours. No results for input(s): LIPASE, AMYLASE in the last 168 hours. No results for input(s): AMMONIA in the last 168 hours. CBC: No results for input(s): WBC, NEUTROABS, HGB, HCT, MCV, PLT in the last 168 hours. Cardiac Enzymes: No results for input(s): CKTOTAL, CKMB, CKMBINDEX, TROPONINI in the last 168 hours. BNP: BNP (last 3 results)  Recent Labs  03/03/15 0028  BNP 31.7    ProBNP (last 3 results)  Recent Labs  05/10/14 1047  PROBNP 173.6*    CBG: No results for input(s): GLUCAP in the last 168 hours.     SignedFlorencia Reasons MD, PhD  [facility] 04/19/2015, 2:03 PM

## 2015-04-20 ENCOUNTER — Telehealth (HOSPITAL_BASED_OUTPATIENT_CLINIC_OR_DEPARTMENT_OTHER): Payer: Self-pay | Admitting: Emergency Medicine

## 2015-04-20 NOTE — Telephone Encounter (Signed)
Post ED Visit - Positive Culture Follow-up: Successful Patient Follow-Up  Culture assessed and recommendations reviewed by: []  Heide Guile, Pharm.D., BCPS-AQ ID []  Alycia Rossetti, Pharm.D., BCPS []  [place], Pharm.D., BCPS, AAHIVP [x]  Legrand Como, Pharm.D., BCPS, AAHIVP []  Tegan Magsam, Pharm.D. []  Milus Glazier, Pharm.D.  Positive urine culture E. Coli  [x]  Patient discharged without antimicrobial prescription and treatment is now indicated []  Organism is resistant to prescribed ED discharge antimicrobial []  Patient with positive blood cultures  Changes discussed with ED provider: Jaquita Folds PA New antibiotic prescription continue Cephalexin, start Macrobid 100mg  po bid x 7 days for UTI Called to [place] [phone]  Contacted patient, 04/20/15 1015   Jordan [name] 04/20/2015, 10:13 AM

## 2015-04-21 ENCOUNTER — Emergency Department (HOSPITAL_COMMUNITY)
Admission: EM | Admit: 2015-04-21 | Discharge: 2015-04-22 | Disposition: A | Discharge status: Home / Self Care | Payer: Medicaid Other | Attending: Emergency Medicine | Admitting: Emergency Medicine

## 2015-04-21 ENCOUNTER — Emergency Department (HOSPITAL_COMMUNITY): Payer: Medicaid Other

## 2015-04-21 ENCOUNTER — Encounter (HOSPITAL_COMMUNITY): Payer: Self-pay | Admitting: Emergency Medicine

## 2015-04-21 DIAGNOSIS — R112 Nausea with vomiting, unspecified: Secondary | ICD-10-CM

## 2015-04-21 DIAGNOSIS — E669 Obesity, unspecified: Secondary | ICD-10-CM | POA: Diagnosis not present

## 2015-04-21 DIAGNOSIS — R7989 Other specified abnormal findings of blood chemistry: Secondary | ICD-10-CM | POA: Diagnosis not present

## 2015-04-21 DIAGNOSIS — Z79899 Other long term (current) drug therapy: Secondary | ICD-10-CM | POA: Insufficient documentation

## 2015-04-21 DIAGNOSIS — R945 Abnormal results of liver function studies: Secondary | ICD-10-CM

## 2015-04-21 DIAGNOSIS — D649 Anemia, unspecified: Secondary | ICD-10-CM | POA: Insufficient documentation

## 2015-04-21 DIAGNOSIS — Z8619 Personal history of other infectious and parasitic diseases: Secondary | ICD-10-CM | POA: Diagnosis not present

## 2015-04-21 DIAGNOSIS — R197 Diarrhea, unspecified: Secondary | ICD-10-CM | POA: Insufficient documentation

## 2015-04-21 DIAGNOSIS — N390 Urinary tract infection, site not specified: Secondary | ICD-10-CM | POA: Diagnosis not present

## 2015-04-21 DIAGNOSIS — R079 Chest pain, unspecified: Secondary | ICD-10-CM | POA: Diagnosis not present

## 2015-04-21 DIAGNOSIS — R000 Tachycardia, unspecified: Secondary | ICD-10-CM | POA: Diagnosis not present

## 2015-04-21 DIAGNOSIS — F329 Major depressive disorder, single episode, unspecified: Secondary | ICD-10-CM | POA: Insufficient documentation

## 2015-04-21 LAB — CBC WITH DIFFERENTIAL/PLATELET
Basophils Absolute: 0 10*3/uL (ref 0.0–0.1)
Basophils Relative: 0 % (ref 0–1)
Eosinophils Absolute: 0 10*3/uL (ref 0.0–0.7)
Eosinophils Relative: 0 % (ref 0–5)
HCT: 43 % (ref 36.0–46.0)
Hemoglobin: 14.5 g/dL (ref 12.0–15.0)
Lymphocytes Relative: 8 % — ABNORMAL LOW (ref 12–46)
Lymphs Abs: 1 10*3/uL (ref 0.7–4.0)
MCH: 30 pg (ref 26.0–34.0)
MCHC: 33.7 g/dL (ref 30.0–36.0)
MCV: 88.8 fL (ref 78.0–100.0)
Monocytes Absolute: 1.1 10*3/uL — ABNORMAL HIGH (ref 0.1–1.0)
Monocytes Relative: 8 % (ref 3–12)
Neutro Abs: 11.1 10*3/uL — ABNORMAL HIGH (ref 1.7–7.7)
Neutrophils Relative %: 84 % — ABNORMAL HIGH (ref 43–77)
Platelets: 215 10*3/uL (ref 150–400)
RBC: 4.84 MIL/uL (ref 3.87–5.11)
RDW: 14.7 % (ref 11.5–15.5)
WBC: 13.2 10*3/uL — ABNORMAL HIGH (ref 4.0–10.5)

## 2015-04-21 LAB — I-STAT TROPONIN, ED: Troponin i, poc: 0.01 ng/mL (ref 0.00–0.08)

## 2015-04-21 LAB — COMPREHENSIVE METABOLIC PANEL
ALT: 173 U/L — ABNORMAL HIGH (ref 14–54)
AST: 221 U/L — ABNORMAL HIGH (ref 15–41)
Albumin: 4 g/dL (ref 3.5–5.0)
Alkaline Phosphatase: 136 U/L — ABNORMAL HIGH (ref 38–126)
Anion gap: 11 (ref 5–15)
BUN: 6 mg/dL (ref 6–20)
CO2: 23 mmol/L (ref 22–32)
Calcium: 9.1 mg/dL (ref 8.9–10.3)
Chloride: 106 mmol/L (ref 101–111)
Creatinine, Ser: 0.75 mg/dL (ref 0.44–1.00)
GFR calc Af Amer: >60 mL/min (ref >60)
GFR calc non Af Amer: >60 mL/min (ref >60)
Glucose, Bld: 116 mg/dL — ABNORMAL HIGH (ref 65–99)
Potassium: 3.9 mmol/L (ref 3.5–5.1)
Sodium: 140 mmol/L (ref 135–145)
Total Bilirubin: 1.3 mg/dL — ABNORMAL HIGH (ref 0.3–1.2)
Total Protein: 6.6 g/dL (ref 6.5–8.1)

## 2015-04-21 LAB — LIPASE, BLOOD: Lipase: 26 U/L (ref 22–51)

## 2015-04-21 MED ORDER — SODIUM CHLORIDE 0.9 % IV BOLUS (SEPSIS)
1000.0000 mL | Freq: Once | INTRAVENOUS | Status: AC
  Administered 2015-04-22: 1000 mL via INTRAVENOUS

## 2015-04-21 MED ORDER — PROCHLORPERAZINE EDISYLATE 5 MG/ML IJ SOLN
10.0000 mg | Freq: Once | INTRAMUSCULAR | Status: DC
  Filled 2015-04-21: qty 2

## 2015-04-21 MED ORDER — PROCHLORPERAZINE EDISYLATE 5 MG/ML IJ SOLN: 10.0000 mg | Freq: Once | INTRAMUSCULAR | Status: DC

## 2015-04-21 MED ORDER — NITROFURANTOIN MONOHYD MACRO 100 MG PO CAPS
100.0000 mg | ORAL_CAPSULE | Freq: Once | ORAL | Status: AC
  Administered 2015-04-21: 100 mg via ORAL
  Filled 2015-04-21: qty 1

## 2015-04-21 MED ORDER — ONDANSETRON HCL 4 MG/2ML IJ SOLN: 4.0000 mg | Freq: Once | INTRAMUSCULAR | Status: DC

## 2015-04-21 NOTE — ED Notes (Signed)
Patient seen here on Sunday for same complaints, patient continues with nausea and vomiting and diarrhea per patient.  Patient has spoke with PCP on Wednesday that stated she had normal labs.

## 2015-04-21 NOTE — ED Provider Notes (Signed)
CSN: [id number]     Arrival date & time 04/21/15  2158 History   First MD Initiated Contact with Patient 04/21/15 2203     Chief Complaint  Patient presents with  . Nausea  . Emesis  . Diarrhea     (Consider location/radiation/quality/duration/timing/severity/associated sxs/prior Treatment) HPI   Blood pressure 153/74, pulse 129, resp. rate 16, last menstrual period 09/13/2014, SpO2 99 %.  Jordan [name] is a 49 y.o. female with past medical history significant for bipolar, obesity, alcohol abuse and planning of nonbloody, non-melanotic diarrhea over the course of 3 weeks with intermittent episodes of nonbloody, nonbilious, no coffee-ground emesis. She reports chest pain, abdominal pain, headache, subjective fever, chills. She's being treated for a urinary tract infection with Keflex, she states that she's been compliant with this medication. States that they told her to start [place] after she finished the course of Keflex. Chart review shows that culture and sensitivity shows resistance to cephalosporins, sensitivity to Macrobid/zosyn. Her psychiatrist changed her anti-psychotic medication 2 Latuda 2 days ago she states that this is not working. She denies any suicidal ideation, homicidal ideation, auditory or visual hallucinations, alcohol or drug use.  She was also complaining of difficulty walking, chart review showed this is not a new issue for her. Her boyfriend states that she is scheduled to start PT next week.   Past Medical History  Diagnosis Date  . Mental disorder   . Bipolar 1 disorder   . Bipolar 1 disorder   . Yeast infection of the vagina   . Bipolar disorder   . Obesity   . UTI (lower urinary tract infection)   . Alcohol abuse   . Shortness of breath dyspnea   . Anemia   . 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 Cholecystectomy     No family history on file. History  Substance Use Topics   . Smoking status: Never Smoker   . Smokeless tobacco: Never Used  . Alcohol Use: No   OB History    Gravida Para Term Preterm AB TAB SAB Ectopic Multiple Living   1 1 1       1      Review of Systems  10 systems reviewed and found to be negative, except as noted in the HPI.   Allergies  Tramadol; Sulfa antibiotics; Citalopram; and Lamotrigine  Home Medications   Prior to Admission medications   Medication Sig Start Date End Date Taking? Authorizing Provider  albuterol (PROVENTIL HFA;VENTOLIN HFA) 108 (90 BASE) MCG/ACT inhaler Inhale 1 puff into the lungs every 6 (six) hours as needed for wheezing or shortness of breath.   Yes Historical Provider, MD  ALPRAZolam Duanne Moron) 0.5 MG tablet Take 1 tablet (0.5 mg total) by mouth 3 (three) times daily as needed for anxiety. Patient taking differently: Take 0.5 mg by mouth 3 (three) times daily.  02/23/15  Yes Shanker Kristeen Mans, MD  cephALEXin (KEFLEX) 500 MG capsule Take 1 capsule (500 mg total) by mouth 4 (four) times daily. Patient taking differently: Take 500 mg by mouth 4 (four) times daily. 7 day course started 04/11/2015 04/10/15  Yes Lacretia Leigh, MD  clotrimazole-betamethasone (LOTRISONE) cream Apply 1 application topically 2 (two) times daily.   Yes Historical Provider, MD  cyanocobalamin (,VITAMIN B-12,) 1000 MCG/ML injection Inject subcutaneously q Daily till 02/27/15,then, Inject subcutaneously q weekly for 4 weeks from 02/28/15, Then from 03/29/15-inject subcutaneously q monthly and stay on it  Patient taking differently: Inject 1,000 mcg into the skin every 30 (thirty) days. Next injection due 04/28/15 02/23/15  Yes Shanker Kristeen Mans, MD  folic acid (FOLVITE) 1 MG tablet Take 1 tablet (1 mg total) by mouth daily. 02/23/15  Yes Shanker Kristeen Mans, MD  Lurasidone HCl (LATUDA) 20 MG TABS Take 1 tablet by mouth daily.   Yes Historical Provider, MD  Thiamine HCl (VITAMIN B-1) 250 MG tablet Take 250 mg by mouth 2 (two) times daily.    Yes Historical  Provider, MD  ondansetron (ZOFRAN) 4 MG tablet Take 1 tablet (4 mg total) by mouth every 6 (six) hours. 04/22/15   Aldene Hendon, PA-C  PARoxetine (PAXIL-CR) 25 MG 24 hr tablet Take 1 tablet (25 mg total) by mouth daily. Patient not taking: Reported on 04/21/2015 02/23/15   Jonetta Osgood, MD  QUEtiapine (SEROQUEL) 100 MG tablet Take 1 tablet (100 mg total) by mouth at bedtime. Patient not taking: Reported on 04/16/2015 02/23/15   Jonetta Osgood, MD  thiamine 100 MG tablet Take 1 tablet (100 mg total) by mouth daily. Patient not taking: Reported on 04/16/2015 02/23/15   Jonetta Osgood, MD   BP 117/69 mmHg  Pulse 118  Temp(Src) 98.9 F (37.2 C) (Oral)  Resp 22  SpO2 93%  LMP 09/13/2014 Physical Exam  Constitutional: She is oriented to person, place, and time. She appears well-developed and well-nourished. No distress.  HENT:  Head: Normocephalic.  Mouth/Throat: Oropharynx is clear and moist.  Eyes: Conjunctivae and EOM are normal. Pupils are equal, round, and reactive to light.  Neck: Normal range of motion.  Cardiovascular: Regular rhythm and intact distal pulses.   tachy  Pulmonary/Chest: Effort normal and breath sounds normal. No stridor. No respiratory distress. She has no wheezes. She has no rales. She exhibits no tenderness.  Abdominal: Soft. Bowel sounds are normal. She exhibits no distension and no mass. There is tenderness. There is no rebound and no guarding.  Diffusely tender to light palpation of all quadrants  Genitourinary:  No CVA tenderness to palpation bilaterally.  Musculoskeletal: Normal range of motion. She exhibits no edema or tenderness.  No calf asymmetry, superficial collaterals, palpable cords, edema, Homans sign negative bilaterally.    Neurological: She is alert and oriented to person, place, and time.  Psychiatric:  Flat affect, labile mood  Nursing note and vitals reviewed.   ED Course  Procedures (including critical care time) Labs Review Labs  Reviewed  CBC WITH DIFFERENTIAL/PLATELET - Abnormal; Notable for the following:    WBC 13.2 (*)    Neutrophils Relative % 84 (*)    Neutro Abs 11.1 (*)    Lymphocytes Relative 8 (*)    Monocytes Absolute 1.1 (*)    All other components within normal limits  COMPREHENSIVE METABOLIC PANEL - Abnormal; Notable for the following:    Glucose, Bld 116 (*)    AST 221 (*)    ALT 173 (*)    Alkaline Phosphatase 136 (*)    Total Bilirubin 1.3 (*)    All other components within normal limits  URINALYSIS, ROUTINE W REFLEX MICROSCOPIC (NOT AT [facility]) - Abnormal; Notable for the following:    Color, Urine AMBER (*)    APPearance CLOUDY (*)    Bilirubin Urine SMALL (*)    Leukocytes, UA LARGE (*)    All other components within normal limits  URINE MICROSCOPIC-ADD ON - Abnormal; Notable for the following:    Squamous Epithelial / LPF MANY (*)  Bacteria, UA MANY (*)    All other components within normal limits  CLOSTRIDIUM DIFFICILE BY PCR (NOT AT [facility])  LIPASE, BLOOD  HEPATITIS PANEL, ACUTE  I-STAT TROPOININ, ED    Imaging Review Dg Chest 2 View  04/21/2015   CLINICAL DATA:  49 year old female with nausea and vomiting and diarrhea.  EXAM: CHEST  2 VIEW  COMPARISON:  Radiograph dated 04/16/2015  FINDINGS: The heart size and mediastinal contours are within normal limits. Both lungs are clear. The visualized skeletal structures are unremarkable.  IMPRESSION: No focal consolidation.   No interval change.   Electronically Signed   By: Anner Crete M.D.   On: 04/21/2015 22:55   US Abdomen Limited Ruq  04/22/2015   CLINICAL DATA:  Chest pain, nausea, vomiting, elevated liver function tests. Onset 5 days ago.  EXAM: US ABDOMEN LIMITED - RIGHT UPPER QUADRANT  COMPARISON:  None.  FINDINGS: Gallbladder:  Prior cholecystectomy  Common bile duct:  Diameter: 4 mm, normal  Liver:  There is generalized echogenicity of the liver without focal lesion. This may represent fatty infiltration.  IMPRESSION: Increased  echogenicity of the liver suggesting fatty infiltration. No focal liver lesions. No acute biliary abnormalities are evident.   Electronically Signed   By: Andreas Newport M.D.   On: 04/22/2015 00:33     EKG Interpretation   Date/Time:  Friday April 21 2015 23:32:26 EDT Ventricular Rate:  121 PR Interval:  150 QRS Duration: 78 QT Interval:  329 QTC Calculation: 467 R Axis:   46 Text Interpretation:  Sinus tachycardia Low voltage, precordial leads  Abnormal R-wave progression, early transition No significant change was  found Confirmed by [facility]  MD, TREY ([id number]) on 04/22/2015 12:21:43 AM      MDM   Final diagnoses:  Chest pain  Elevated LFTs  Nausea vomiting and diarrhea  UTI (lower urinary tract infection)    Filed Vitals:   04/21/15 2334 04/21/15 2345 04/22/15 0045 04/22/15 0115  BP: 128/67 113/72 117/69 111/87  Pulse: 124 120 118 112  Temp:      TempSrc:      Resp: 20 26 22 29   SpO2: 99% 98% 93% 96%    Medications  prochlorperazine (COMPAZINE) injection 10 mg (10 mg Intravenous Given 04/22/15 0010)  sodium chloride 0.9 % bolus 1,000 mL (0 mLs Intravenous Stopped 04/22/15 0112)  nitrofurantoin (macrocrystal-monohydrate) (MACROBID) capsule 100 mg (100 mg Oral Given 04/21/15 2338)  HYDROmorphone (DILAUDID) injection 0.5 mg (0.5 mg Intravenous Given 04/22/15 0036)    Jordan [name] is a pleasant 49 y.o. female presenting with notable complaints including chest pain, nausea vomiting, diarrhea. EKG shows a sinus tachycardia. Troponin is negative. Physical exam is not consistent with DVT.  Urinalysis is highly contaminated with many bacteria and large leukocytes. She has a known UTI, she was being treated with Keflex, prior culture shows sensitivity to Macrobid. There was some confusion over how to take the antibiotics, I've instructed her not to take the Keflex but transition directly to the [facility], she is given a dose in the ED. She has a mild leukocytosis of 13.2, no CVA tenderness  to palpation, her abdominal exam is diffusely tender with no focal tenderness, no peritoneal signs. Chemistry shows transaminitis with elevated alkaline phosphatase and slightly bumped total bilirubin, will obtain right upper quadrant ultrasound and order hepatitis panel. I've explained to the patient that the hepatitis panel must be followed up by her primary care physician. Patient verbalized her understanding. Repeat abdominal exam are nonsurgical.  R upper quadrant ultrasound shows fatty liver, no acute biliary abnormalities. I've advised the patient that she will need to follow with gastroenterology for further managment. Advised the patient that she should not drink alcohol or take any Tylenol. Patient verbalized her understanding. Repeat abdominal exam remains nonsurgical. Her heart rate is improving with fluids. Advised her to push fluids at home. Pt is tolerating by mouth fluids. Attempted to obtain stool sample to send for C. difficile culture, but patient is unable to produce sample. I have advised the patient that she can take home the hat and a specimen container and return it to the ED for further testing.  Evaluation does not show pathology that would require ongoing emergent intervention or inpatient treatment. Pt is hemodynamically stable and mentating appropriately. Discussed findings and plan with patient/guardian, who agrees with care plan. All questions answered. Return precautions discussed and outpatient follow up given.   New Prescriptions   ONDANSETRON (ZOFRAN) 4 MG TABLET    Take 1 tablet (4 mg total) by mouth every 6 (six) hours.         Monico Blitz, PA-C 04/22/15 0136  Serita Grit, MD 04/24/15 [phone]

## 2015-04-22 ENCOUNTER — Telehealth (HOSPITAL_COMMUNITY): Payer: Self-pay

## 2015-04-22 ENCOUNTER — Emergency Department (HOSPITAL_COMMUNITY): Payer: Medicaid Other

## 2015-04-22 LAB — URINALYSIS, ROUTINE W REFLEX MICROSCOPIC
Glucose, UA: NEGATIVE mg/dL
Hgb urine dipstick: NEGATIVE
Ketones, ur: NEGATIVE mg/dL
Nitrite: NEGATIVE
Protein, ur: NEGATIVE mg/dL
Specific Gravity, Urine: 1.022 (ref 1.005–1.030)
Urobilinogen, UA: 1 mg/dL (ref 0.0–1.0)
pH: 5 (ref 5.0–8.0)

## 2015-04-22 LAB — URINE MICROSCOPIC-ADD ON

## 2015-04-22 MED ORDER — HYDROMORPHONE HCL 1 MG/ML IJ SOLN
0.5000 mg | Freq: Once | INTRAMUSCULAR | Status: AC
  Administered 2015-04-22: 0.5 mg via INTRAVENOUS
  Filled 2015-04-22: qty 1

## 2015-04-22 MED ORDER — PROCHLORPERAZINE EDISYLATE 5 MG/ML IJ SOLN
10.0000 mg | Freq: Four times a day (QID) | INTRAMUSCULAR | Status: DC | PRN
  Administered 2015-04-22: 10 mg via INTRAVENOUS
  Filled 2015-04-22: qty 2

## 2015-04-22 MED ORDER — ONDANSETRON HCL 4 MG PO TABS: 4.0000 mg | ORAL_TABLET | Freq: Four times a day (QID) | ORAL | Status: DC

## 2015-04-22 NOTE — ED Notes (Signed)
To ultrasound

## 2015-04-22 NOTE — Telephone Encounter (Signed)
Pt calling unsure what Rx medication is and what its for.  Explained Zofran is for nausea.

## 2015-04-22 NOTE — Discharge Instructions (Signed)
Not drink any alcohol or take any acetaminophen (Tylenol)  You can stop taking the Keflex and start taking Macrobid for your urinary tract infection. New  There were some tests that were ordered in the emergency department will not resolve for several days, your primary care doctor, Dr. Ouida Sills must request these results. Please let Dr. Ouida Sills know that you were seen in the emergency room, he must obtain records. You should see him in the next 3-5 days for a checkup.  Please follow with your primary care doctor in the next 2 days for a check-up. They must obtain records for further management.   Do not hesitate to return to the Emergency Department for any new, worsening or concerning symptoms.

## 2015-04-23 LAB — HEPATITIS PANEL, ACUTE
HCV Ab: <0.1 s/co ratio (ref 0.0–0.9)
Hep A IgM: NEGATIVE
Hep B C IgM: NEGATIVE
Hepatitis B Surface Ag: NEGATIVE

## 2015-04-30 ENCOUNTER — Encounter (HOSPITAL_COMMUNITY): Payer: Self-pay | Admitting: Emergency Medicine

## 2015-04-30 ENCOUNTER — Emergency Department (HOSPITAL_COMMUNITY): Payer: Medicaid Other

## 2015-04-30 ENCOUNTER — Emergency Department (HOSPITAL_COMMUNITY)
Admission: EM | Admit: 2015-04-30 | Discharge: 2015-05-01 | Disposition: A | Discharge status: Home / Self Care | Payer: Medicaid Other | Attending: Emergency Medicine | Admitting: Emergency Medicine

## 2015-04-30 DIAGNOSIS — Z79899 Other long term (current) drug therapy: Secondary | ICD-10-CM | POA: Insufficient documentation

## 2015-04-30 DIAGNOSIS — R109 Unspecified abdominal pain: Secondary | ICD-10-CM | POA: Diagnosis present

## 2015-04-30 DIAGNOSIS — D531 Other megaloblastic anemias, not elsewhere classified: Secondary | ICD-10-CM | POA: Insufficient documentation

## 2015-04-30 DIAGNOSIS — Z87440 Personal history of urinary (tract) infections: Secondary | ICD-10-CM | POA: Diagnosis not present

## 2015-04-30 DIAGNOSIS — E669 Obesity, unspecified: Secondary | ICD-10-CM | POA: Insufficient documentation

## 2015-04-30 DIAGNOSIS — Z8619 Personal history of other infectious and parasitic diseases: Secondary | ICD-10-CM | POA: Diagnosis not present

## 2015-04-30 DIAGNOSIS — F319 Bipolar disorder, unspecified: Secondary | ICD-10-CM | POA: Diagnosis not present

## 2015-04-30 DIAGNOSIS — K5901 Slow transit constipation: Secondary | ICD-10-CM | POA: Insufficient documentation

## 2015-04-30 LAB — COMPREHENSIVE METABOLIC PANEL
ALT: 98 U/L — ABNORMAL HIGH (ref 14–54)
AST: 152 U/L — ABNORMAL HIGH (ref 15–41)
Albumin: 3.8 g/dL (ref 3.5–5.0)
Alkaline Phosphatase: 124 U/L (ref 38–126)
Anion gap: 10 (ref 5–15)
BUN: 5 mg/dL — ABNORMAL LOW (ref 6–20)
CO2: 22 mmol/L (ref 22–32)
Calcium: 9.1 mg/dL (ref 8.9–10.3)
Chloride: 107 mmol/L (ref 101–111)
Creatinine, Ser: 0.68 mg/dL (ref 0.44–1.00)
GFR calc Af Amer: >60 mL/min (ref >60)
GFR calc non Af Amer: >60 mL/min (ref >60)
Glucose, Bld: 109 mg/dL — ABNORMAL HIGH (ref 65–99)
Potassium: 3.7 mmol/L (ref 3.5–5.1)
Sodium: 139 mmol/L (ref 135–145)
Total Bilirubin: 1.2 mg/dL (ref 0.3–1.2)
Total Protein: 6.3 g/dL — ABNORMAL LOW (ref 6.5–8.1)

## 2015-04-30 LAB — CBC WITH DIFFERENTIAL/PLATELET
Basophils Absolute: 0 K/uL (ref 0.0–0.1)
Basophils Relative: 0 % (ref 0–1)
Eosinophils Absolute: 0 K/uL (ref 0.0–0.7)
Eosinophils Relative: 0 % (ref 0–5)
HCT: 45.7 % (ref 36.0–46.0)
Hemoglobin: 15.4 g/dL — ABNORMAL HIGH (ref 12.0–15.0)
Lymphocytes Relative: 29 % (ref 12–46)
Lymphs Abs: 1.7 K/uL (ref 0.7–4.0)
MCH: 29.9 pg (ref 26.0–34.0)
MCHC: 33.7 g/dL (ref 30.0–36.0)
MCV: 88.7 fL (ref 78.0–100.0)
Monocytes Absolute: 0.4 K/uL (ref 0.1–1.0)
Monocytes Relative: 8 % (ref 3–12)
Neutro Abs: 3.8 K/uL (ref 1.7–7.7)
Neutrophils Relative %: 63 % (ref 43–77)
Platelets: 199 K/uL (ref 150–400)
RBC: 5.15 MIL/uL — ABNORMAL HIGH (ref 3.87–5.11)
RDW: 14 % (ref 11.5–15.5)
WBC: 5.9 K/uL (ref 4.0–10.5)

## 2015-04-30 LAB — URINALYSIS, ROUTINE W REFLEX MICROSCOPIC
Glucose, UA: NEGATIVE mg/dL
Ketones, ur: 15 mg/dL — AB
Nitrite: POSITIVE — AB
Protein, ur: NEGATIVE mg/dL
Specific Gravity, Urine: 1.023 (ref 1.005–1.030)
Urobilinogen, UA: 1 mg/dL (ref 0.0–1.0)
pH: 5 (ref 5.0–8.0)

## 2015-04-30 LAB — URINE MICROSCOPIC-ADD ON

## 2015-04-30 LAB — LIPASE, BLOOD: Lipase: 30 U/L (ref 22–51)

## 2015-04-30 MED ORDER — MAGNESIUM CITRATE PO SOLN: 1.0000 | Freq: Once | ORAL | Status: DC

## 2015-04-30 MED ORDER — LACTULOSE 10 GM/15ML PO SOLN: 10.0000 g | Freq: Two times a day (BID) | ORAL | Status: DC | PRN

## 2015-04-30 MED ORDER — DOCUSATE SODIUM 100 MG PO CAPS
100.0000 mg | ORAL_CAPSULE | Freq: Two times a day (BID) | ORAL | Status: DC
Start: 2015-04-30 — End: 2015-05-12

## 2015-04-30 NOTE — ED Notes (Signed)
C/O upper and lower abdominal pain for 2 days.  No nausea or vomiting.  Also c/o bil leg pain for last three days.  Also c/o dizzy when standing up.  States i was recently treated for a UTI but I am still having symptoms of one.

## 2015-04-30 NOTE — Discharge Instructions (Signed)
Abdominal Pain Many things can cause abdominal pain. Usually, abdominal pain is not caused by a disease and will improve without treatment. It can often be observed and treated at home. Your health care provider will do a physical exam and possibly order blood tests and X-rays to help determine the seriousness of your pain. However, in many cases, more time must pass before a clear cause of the pain can be found. Before that point, your health care provider may not know if you need more testing or further treatment. HOME CARE INSTRUCTIONS  Monitor your abdominal pain for any changes. The following actions may help to alleviate any discomfort you are experiencing:  Only take over-the-counter or prescription medicines as directed by your health care provider.  Do not take laxatives unless directed to do so by your health care provider.  Try a clear liquid diet (broth, tea, or water) as directed by your health care provider. Slowly move to a bland diet as tolerated. SEEK MEDICAL CARE IF:  You have unexplained abdominal pain.  You have abdominal pain associated with nausea or diarrhea.  You have pain when you urinate or have a bowel movement.  You experience abdominal pain that wakes you in the night.  You have abdominal pain that is worsened or improved by eating food.  You have abdominal pain that is worsened with eating fatty foods.  You have a fever. SEEK IMMEDIATE MEDICAL CARE IF:   Your pain does not go away within 2 hours.  You keep throwing up (vomiting).  Your pain is felt only in portions of the abdomen, such as the right side or the left lower portion of the abdomen.  You pass bloody or black tarry stools. MAKE SURE YOU:  Understand these instructions.   Will watch your condition.   Will get help right away if you are not doing well or get worse.  Document Released: 07/17/2005 Document Revised: 10/12/2013 Document Reviewed: 06/16/2013 [facility] Patient Information  2015 [place], [place]. This information is not intended to replace advice given to you by your health care provider. Make sure you discuss any questions you have with your health care provider.  Constipation Constipation is when a person:  Poops (has a bowel movement) less than 3 times a week.  Has a hard time pooping.  Has poop that is dry, hard, or bigger than normal. HOME CARE   Eat foods with a lot of fiber in them. This includes fruits, vegetables, beans, and whole grains such as brown rice.  Avoid fatty foods and foods with a lot of sugar. This includes french fries, hamburgers, cookies, candy, and soda.  If you are not getting enough fiber from food, take products with added fiber in them (supplements).  Drink enough fluid to keep your pee (urine) clear or pale yellow.  Exercise on a regular basis, or as told by your doctor.  Go to the restroom when you feel like you need to poop. Do not hold it.  Only take medicine as told by your doctor. Do not take medicines that help you poop (laxatives) without talking to your doctor first. GET HELP RIGHT AWAY IF:   You have bright red blood in your poop (stool).  Your constipation lasts more than 4 days or gets worse.  You have belly (abdominal) or butt (rectal) pain.  You have thin poop (as thin as a pencil).  You lose weight, and it cannot be explained. MAKE SURE YOU:   Understand these instructions.  Will  watch your condition.  Will get help right away if you are not doing well or get worse. Document Released: 03/25/2008 Document Revised: 10/12/2013 Document Reviewed: 07/19/2013 [facility] Patient Information 2015 [place], [place]. This information is not intended to replace advice given to you by your health care provider. Make sure you discuss any questions you have with your health care provider.

## 2015-04-30 NOTE — ED Provider Notes (Addendum)
CSN: [id number]     Arrival date & time 04/30/15  2124 History   First MD Initiated Contact with Patient 04/30/15 2125     Chief Complaint  Patient presents with  . Abdominal Pain     (Consider location/radiation/quality/duration/timing/severity/associated sxs/prior Treatment) HPI Comments: Patient presents to the ER for evaluation of abdominal pain. Patient reports that she has been having pain across her abdomen for the last 2 days. She has felt constipated, has not had a bowel movement. There is no nausea or vomiting. She feels like she is dizzy, especially when she stands up. Was recently treated for urinary tract infection but still experiencing foul-smelling urine.  Patient is a 49 y.o. female presenting with abdominal pain.  Abdominal Pain   Past Medical History  Diagnosis Date  . Mental disorder   . Bipolar 1 disorder   . Bipolar 1 disorder   . Yeast infection of the vagina   . Bipolar disorder   . Obesity   . UTI (lower urinary tract infection)   . Alcohol abuse   . Shortness of breath dyspnea   . Anemia   . 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 Cholecystectomy     No family history on file. History  Substance Use Topics  . Smoking status: Never Smoker   . Smokeless tobacco: Never Used  . Alcohol Use: No   OB History    Gravida Para Term Preterm AB TAB SAB Ectopic Multiple Living   1 1 1       1      Review of Systems  Gastrointestinal: Positive for abdominal pain.      Allergies  Tramadol; Sulfa antibiotics; Citalopram; and Lamotrigine  Home Medications   Prior to Admission medications   Medication Sig Start Date End Date Taking? Authorizing Provider  albuterol (PROVENTIL HFA;VENTOLIN HFA) 108 (90 BASE) MCG/ACT inhaler Inhale 1 puff into the lungs every 6 (six) hours as needed for wheezing or shortness of breath.    Historical Provider, MD  ALPRAZolam Duanne Moron) 0.5 MG tablet  Take 1 tablet (0.5 mg total) by mouth 3 (three) times daily as needed for anxiety. Patient taking differently: Take 0.5 mg by mouth 3 (three) times daily.  02/23/15   Shanker Kristeen Mans, MD  cephALEXin (KEFLEX) 500 MG capsule Take 1 capsule (500 mg total) by mouth 4 (four) times daily. Patient taking differently: Take 500 mg by mouth 4 (four) times daily. 7 day course started 04/11/2015 04/10/15   Lacretia Leigh, MD  clotrimazole-betamethasone (LOTRISONE) cream Apply 1 application topically 2 (two) times daily.    Historical Provider, MD  cyanocobalamin (,VITAMIN B-12,) 1000 MCG/ML injection Inject subcutaneously q Daily till 02/27/15,then, Inject subcutaneously q weekly for 4 weeks from 02/28/15, Then from 03/29/15-inject subcutaneously q monthly and stay on it Patient taking differently: Inject 1,000 mcg into the skin every 30 (thirty) days. Next injection due 04/28/15 02/23/15   Jonetta Osgood, MD  folic acid (FOLVITE) 1 MG tablet Take 1 tablet (1 mg total) by mouth daily. 02/23/15   Shanker Kristeen Mans, MD  Lurasidone HCl (LATUDA) 20 MG TABS Take 1 tablet by mouth daily.    Historical Provider, MD  ondansetron (ZOFRAN) 4 MG tablet Take 1 tablet (4 mg total) by mouth every 6 (six) hours. 04/22/15   Nicole Pisciotta, PA-C  PARoxetine (PAXIL-CR) 25 MG 24 hr tablet Take 1 tablet (25 mg total) by mouth daily. Patient not  taking: Reported on 04/21/2015 02/23/15   Jonetta Osgood, MD  QUEtiapine (SEROQUEL) 100 MG tablet Take 1 tablet (100 mg total) by mouth at bedtime. Patient not taking: Reported on 04/16/2015 02/23/15   Jonetta Osgood, MD  thiamine 100 MG tablet Take 1 tablet (100 mg total) by mouth daily. Patient not taking: Reported on 04/16/2015 02/23/15   Jonetta Osgood, MD  Thiamine HCl (VITAMIN B-1) 250 MG tablet Take 250 mg by mouth 2 (two) times daily.     Historical Provider, MD   BP 115/66 mmHg  Pulse 106  Temp(Src) 97.5 F (36.4 C) (Oral)  Resp 18  Ht 5\' 8"  (1.727 m)  Wt 312 lb (141.522 kg)  BMI  47.45 kg/m2  LMP 09/13/2014 Physical Exam  ED Course  Procedures (including critical care time) Labs Review Labs Reviewed  CBC WITH DIFFERENTIAL/PLATELET - Abnormal; Notable for the following:    RBC 5.15 (*)    Hemoglobin 15.4 (*)    All other components within normal limits  COMPREHENSIVE METABOLIC PANEL - Abnormal; Notable for the following:    Glucose, Bld 109 (*)    BUN 5 (*)    Total Protein 6.3 (*)    AST 152 (*)    ALT 98 (*)    All other components within normal limits  URINE CULTURE  LIPASE, BLOOD  URINALYSIS, ROUTINE W REFLEX MICROSCOPIC (NOT AT [facility])    Imaging Review Dg Abd Acute W/chest  04/30/2015   CLINICAL DATA:  49 year old female with lower abdominal pain  EXAM: DG ABDOMEN ACUTE W/ 1V CHEST  COMPARISON:  Chest radiograph dated 04/21/2015  FINDINGS: There is no evidence of dilated bowel loops or free intraperitoneal air. No radiopaque calculi or other significant radiographic abnormality is seen. Heart size and mediastinal contours are within normal limits. Both lungs are clear. Cholecystectomy clips. Moderate stool throughout the colon.  IMPRESSION: Negative abdominal radiographs.  No acute cardiopulmonary disease.   Electronically Signed   By: Anner Crete M.D.   On: 04/30/2015 22:59     EKG Interpretation   Date/Time:  Sunday April 30 2015 21:30:53 EDT Ventricular Rate:  97 PR Interval:  142 QRS Duration: 69 QT Interval:  337 QTC Calculation: 428 R Axis:   50 Text Interpretation:  Sinus rhythm Abnormal R-wave progression, early  transition No significant change since last tracing Confirmed by POLLINA   MD, CHRISTOPHER [phone]) on 04/30/2015 9:59:40 PM      MDM   Final diagnoses:  Abdominal pain  constipation  Presents to the emergency per for abdominal pain. Patient has been seen multiple times for this complaint in the past. She was seen one week ago and had ultrasound performed because her LFTs were abnormal. Ultrasound didn't show any acute  abnormality. Patient now feels like she is experiencing constipation. She reports that she has not had a bowel movement for 2 days. X-ray does not show any sign of obstruction but there is moderate stool throughout the colon. Patient will be treated for constipation, follow-up with PCP.   Orpah Greek, MD 04/30/15 6203  Orpah Greek, MD 05/06/15 0730

## 2015-05-02 LAB — URINE CULTURE: Culture: >=100000

## 2015-05-03 ENCOUNTER — Telehealth: Payer: Self-pay | Admitting: Emergency Medicine

## 2015-05-03 NOTE — Telephone Encounter (Signed)
Post ED Visit - Positive Culture Follow-up  Culture report reviewed by antimicrobial stewardship pharmacist: []  Wes Hurley, Pharm.D., BCPS [x]  Heide Guile, Pharm.D., BCPS []  Alycia Rossetti, Pharm.D., BCPS []  [place], Pharm.D., BCPS, AAHIVP []  Legrand Como, Pharm.D., BCPS, AAHIVP []  Isac Sarna, Pharm.D., BCPS  Positive Urine culture No further patient follow-up is required at this time.  Ernesta Amble 05/03/2015, 5:05 PM

## 2015-05-10 ENCOUNTER — Inpatient Hospital Stay (HOSPITAL_COMMUNITY)
Admission: EM | Admit: 2015-05-10 | Discharge: 2015-05-12 | DRG: 690 | Disposition: A | Discharge status: Home Health | Payer: Medicaid Other | Attending: Internal Medicine | Admitting: Internal Medicine

## 2015-05-10 ENCOUNTER — Encounter (HOSPITAL_COMMUNITY): Payer: Self-pay | Admitting: Emergency Medicine

## 2015-05-10 DIAGNOSIS — B9620 Unspecified Escherichia coli [E. coli] as the cause of diseases classified elsewhere: Secondary | ICD-10-CM | POA: Diagnosis present

## 2015-05-10 DIAGNOSIS — A499 Bacterial infection, unspecified: Secondary | ICD-10-CM | POA: Diagnosis present

## 2015-05-10 DIAGNOSIS — Z1612 Extended spectrum beta lactamase (ESBL) resistance: Secondary | ICD-10-CM | POA: Diagnosis present

## 2015-05-10 DIAGNOSIS — Z79899 Other long term (current) drug therapy: Secondary | ICD-10-CM

## 2015-05-10 DIAGNOSIS — Z7982 Long term (current) use of aspirin: Secondary | ICD-10-CM

## 2015-05-10 DIAGNOSIS — R109 Unspecified abdominal pain: Secondary | ICD-10-CM | POA: Diagnosis present

## 2015-05-10 DIAGNOSIS — Z6841 Body Mass Index (BMI) 40.0 and over, adult: Secondary | ICD-10-CM

## 2015-05-10 DIAGNOSIS — Z885 Allergy status to narcotic agent status: Secondary | ICD-10-CM

## 2015-05-10 DIAGNOSIS — G8929 Other chronic pain: Secondary | ICD-10-CM | POA: Diagnosis present

## 2015-05-10 DIAGNOSIS — Z9049 Acquired absence of other specified parts of digestive tract: Secondary | ICD-10-CM | POA: Diagnosis present

## 2015-05-10 DIAGNOSIS — F319 Bipolar disorder, unspecified: Secondary | ICD-10-CM | POA: Diagnosis present

## 2015-05-10 DIAGNOSIS — M545 Low back pain: Secondary | ICD-10-CM | POA: Diagnosis present

## 2015-05-10 DIAGNOSIS — K5900 Constipation, unspecified: Secondary | ICD-10-CM

## 2015-05-10 DIAGNOSIS — Z888 Allergy status to other drugs, medicaments and biological substances status: Secondary | ICD-10-CM

## 2015-05-10 DIAGNOSIS — E669 Obesity, unspecified: Secondary | ICD-10-CM | POA: Diagnosis present

## 2015-05-10 DIAGNOSIS — Z882 Allergy status to sulfonamides status: Secondary | ICD-10-CM

## 2015-05-10 DIAGNOSIS — N390 Urinary tract infection, site not specified: Principal | ICD-10-CM | POA: Diagnosis present

## 2015-05-10 LAB — COMPREHENSIVE METABOLIC PANEL
ALT: 69 U/L — ABNORMAL HIGH (ref 14–54)
AST: 94 U/L — ABNORMAL HIGH (ref 15–41)
Albumin: 4.3 g/dL (ref 3.5–5.0)
Alkaline Phosphatase: 123 U/L (ref 38–126)
Anion gap: 9 (ref 5–15)
BUN: 10 mg/dL (ref 6–20)
CO2: 24 mmol/L (ref 22–32)
Calcium: 9.4 mg/dL (ref 8.9–10.3)
Chloride: 109 mmol/L (ref 101–111)
Creatinine, Ser: 0.6 mg/dL (ref 0.44–1.00)
GFR calc Af Amer: >60 mL/min (ref >60)
GFR calc non Af Amer: >60 mL/min (ref >60)
Glucose, Bld: 113 mg/dL — ABNORMAL HIGH (ref 65–99)
Potassium: 4 mmol/L (ref 3.5–5.1)
Sodium: 142 mmol/L (ref 135–145)
Total Bilirubin: 1.1 mg/dL (ref 0.3–1.2)
Total Protein: 7.2 g/dL (ref 6.5–8.1)

## 2015-05-10 LAB — URINALYSIS, ROUTINE W REFLEX MICROSCOPIC
Glucose, UA: NEGATIVE mg/dL
Ketones, ur: NEGATIVE mg/dL
Nitrite: POSITIVE — AB
Protein, ur: 30 mg/dL — AB
Specific Gravity, Urine: 1.025 (ref 1.005–1.030)
Urobilinogen, UA: 1 mg/dL (ref 0.0–1.0)
pH: 5.5 (ref 5.0–8.0)

## 2015-05-10 LAB — CBC
HCT: 45.9 % (ref 36.0–46.0)
Hemoglobin: 15.5 g/dL — ABNORMAL HIGH (ref 12.0–15.0)
MCH: 29.5 pg (ref 26.0–34.0)
MCHC: 33.8 g/dL (ref 30.0–36.0)
MCV: 87.3 fL (ref 78.0–100.0)
Platelets: 183 10*3/uL (ref 150–400)
RBC: 5.26 MIL/uL — ABNORMAL HIGH (ref 3.87–5.11)
RDW: 13.7 % (ref 11.5–15.5)
WBC: 6.1 10*3/uL (ref 4.0–10.5)

## 2015-05-10 LAB — URINE MICROSCOPIC-ADD ON

## 2015-05-10 LAB — LIPASE, BLOOD: Lipase: 27 U/L (ref 22–51)

## 2015-05-10 MED ORDER — SODIUM CHLORIDE 0.9 % IV BOLUS (SEPSIS)
1000.0000 mL | Freq: Once | INTRAVENOUS | Status: AC
  Administered 2015-05-11: 1000 mL via INTRAVENOUS

## 2015-05-10 NOTE — ED Provider Notes (Signed)
CSN: [id number]     Arrival date & time 05/10/15  2126 History   First MD Initiated Contact with Patient 05/10/15 2249     Chief Complaint  Patient presents with  . Abdominal Pain     (Consider location/radiation/quality/duration/timing/severity/associated sxs/prior Treatment) HPI   Blood pressure 136/87, pulse 105, temperature 98 F (36.7 C), temperature source Oral, resp. rate 18, last menstrual period 09/13/2014, SpO2 94 %.  Jordan [name] is a 49 y.o. female complaining of bilateral lower abdominal pain which she's had for several weeks, no pain medication taken prior to arrival. Patient denies dysuria, hematuria, fever, chills, nausea, vomiting she reports that she has had looser than normal stool it is non-melanotic,  nonbloody. 2 loose stools today. Patient states that she has been taking antibiotics for a UTI but she does not remember the name. As per her boyfriend she stood up to meet the ambulance on the way to the ED and she had a bowel movement on herself, states that she was aware that she was having a bowel movement. She does have chronic low back pain however she states that it's at its typical level. She denies fever, chills, history of IV drug use  Past Medical History  Diagnosis Date  . Mental disorder   . Bipolar 1 disorder   . Bipolar 1 disorder   . Yeast infection of the vagina   . Bipolar disorder   . Obesity   . UTI (lower urinary tract infection)   . Alcohol abuse   . Shortness of breath dyspnea   . Anemia   . 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 Cholecystectomy     No family history on file. History  Substance Use Topics  . Smoking status: Never Smoker   . Smokeless tobacco: Never Used  . Alcohol Use: No   OB History    Gravida Para Term Preterm AB TAB SAB Ectopic Multiple Living   1 1 1       1      Review of Systems  10 systems reviewed and found to be negative, except  as noted in the HPI.   Allergies  Tramadol; Sulfa antibiotics; Citalopram; and Lamotrigine  Home Medications   Prior to Admission medications   Medication Sig Start Date End Date Taking? Authorizing Provider  albuterol (PROVENTIL HFA;VENTOLIN HFA) 108 (90 BASE) MCG/ACT inhaler Inhale 1 puff into the lungs every 6 (six) hours as needed for wheezing or shortness of breath.   Yes Historical Provider, MD  aspirin 325 MG tablet Take 325 mg by mouth daily as needed for mild pain.   Yes Historical Provider, MD  cyanocobalamin (,VITAMIN B-12,) 1000 MCG/ML injection Inject subcutaneously q Daily till 02/27/15,then, Inject subcutaneously q weekly for 4 weeks from 02/28/15, Then from 03/29/15-inject subcutaneously q monthly and stay on it Patient taking differently: Inject 1,000 mcg into the skin every 30 (thirty) days. Next injection due 04/28/15 02/23/15  Yes Shanker Kristeen Mans, MD  folic acid (FOLVITE) 1 MG tablet Take 1 tablet (1 mg total) by mouth daily. 02/23/15  Yes Shanker Kristeen Mans, MD  Lurasidone HCl (LATUDA) 20 MG TABS Take 1 tablet by mouth daily.   Yes Historical Provider, MD  ondansetron (ZOFRAN) 4 MG tablet Take 1 tablet (4 mg total) by mouth every 6 (six) hours. 04/22/15  Yes Basel Defalco, PA-C  Thiamine HCl (VITAMIN B-1) 250 MG tablet Take 250 mg by mouth daily.  Yes Historical Provider, MD  ALPRAZolam Duanne Moron) 0.5 MG tablet Take 1 tablet (0.5 mg total) by mouth 3 (three) times daily as needed for anxiety. Patient not taking: Reported on 05/10/2015 02/23/15   Jonetta Osgood, MD  cephALEXin (KEFLEX) 500 MG capsule Take 1 capsule (500 mg total) by mouth 4 (four) times daily. Patient not taking: Reported on 05/10/2015 04/10/15   Lacretia Leigh, MD  docusate sodium (COLACE) 100 MG capsule Take 1 capsule (100 mg total) by mouth every 12 (twelve) hours. Patient not taking: Reported on 05/10/2015 04/30/15   Orpah Greek, MD  lactulose (CHRONULAC) 10 GM/15ML solution Take 15 mLs (10 g total) by  mouth 2 (two) times daily as needed for mild constipation or moderate constipation. Patient not taking: Reported on 05/10/2015 04/30/15   Orpah Greek, MD  magnesium citrate SOLN Take 296 mLs (1 Bottle total) by mouth once. Patient not taking: Reported on 05/10/2015 04/30/15   Orpah Greek, MD  PARoxetine (PAXIL-CR) 25 MG 24 hr tablet Take 1 tablet (25 mg total) by mouth daily. Patient not taking: Reported on 04/21/2015 02/23/15   Jonetta Osgood, MD  QUEtiapine (SEROQUEL) 100 MG tablet Take 1 tablet (100 mg total) by mouth at bedtime. Patient not taking: Reported on 04/16/2015 02/23/15   Jonetta Osgood, MD  thiamine 100 MG tablet Take 1 tablet (100 mg total) by mouth daily. Patient not taking: Reported on 04/16/2015 02/23/15   Jonetta Osgood, MD   BP 136/87 mmHg  Pulse 105  Temp(Src) 98 F (36.7 C) (Oral)  Resp 18  SpO2 94%  LMP 09/13/2014 Physical Exam  Constitutional: She is oriented to person, place, and time. She appears well-developed and well-nourished. No distress.  Obese  HENT:  Head: Normocephalic.  Mouth/Throat: Oropharynx is clear and moist.  Eyes: Conjunctivae and EOM are normal. Pupils are equal, round, and reactive to light.  Neck: Normal range of motion.  Cardiovascular: Regular rhythm and intact distal pulses.   Mild tachycardia  Pulmonary/Chest: Effort normal and breath sounds normal. No stridor. No respiratory distress. She has no wheezes. She has no rales. She exhibits no tenderness.  Abdominal: Soft. Bowel sounds are normal. She exhibits no distension and no mass. There is no tenderness. There is no rebound and no guarding.  Genitourinary:  No CVAT b/l  Musculoskeletal: Normal range of motion. She exhibits no edema.  Neurological: She is alert and oriented to person, place, and time.  Skin: Rash noted.  Candida in between the folds of her pannus  Psychiatric: She has a normal mood and affect.  Flat affect  Nursing note and vitals  reviewed.   ED Course  Procedures (including critical care time) Labs Review Labs Reviewed  COMPREHENSIVE METABOLIC PANEL - Abnormal; Notable for the following:    Glucose, Bld 113 (*)    AST 94 (*)    ALT 69 (*)    All other components within normal limits  CBC - Abnormal; Notable for the following:    RBC 5.26 (*)    Hemoglobin 15.5 (*)    All other components within normal limits  URINALYSIS, ROUTINE W REFLEX MICROSCOPIC (NOT AT [facility]) - Abnormal; Notable for the following:    Color, Urine AMBER (*)    APPearance TURBID (*)    Hgb urine dipstick MODERATE (*)    Bilirubin Urine SMALL (*)    Protein, ur 30 (*)    Nitrite POSITIVE (*)    Leukocytes, UA LARGE (*)  All other components within normal limits  URINE MICROSCOPIC-ADD ON - Abnormal; Notable for the following:    Bacteria, UA MANY (*)    All other components within normal limits  LIPASE, BLOOD    Imaging Review No results found.   EKG Interpretation None      MDM   Final diagnoses:  UTI (lower urinary tract infection)    Filed Vitals:   05/10/15 2131 05/10/15 2132 05/10/15 2347  BP:  103/56 136/87  Pulse:  115 105  Temp:  98.1 F (36.7 C) 98 F (36.7 C)  TempSrc:  Oral Oral  Resp:  20 18  SpO2: 95% 93% 94%    Medications  sodium chloride 0.9 % bolus 1,000 mL (not administered)  imipenem-cilastatin (PRIMAXIN) 500 mg in sodium chloride 0.9 % 100 mL IVPB (not administered)    Jordan [name] is a pleasant 49 y.o. female presenting with lower abdominal pain, she has candidiasis enough. Her pannus, states that the pain is not in the skin but deep inside the abdomen. However my abdominal exam is benign, she is tachycardic which she's been on her multiple visits to the ED in the past. Blood work is unremarkable however her urinalysis is concerning for infection, patient has a history of ESBL UTI with sepsis, she will need admission. Case discussed with [facility] hospitalist  who will put him holding  orders.   Monico Blitz, PA-C 05/11/15 0022  Wandra Arthurs, MD 05/11/15 [phone]

## 2015-05-10 NOTE — ED Notes (Addendum)
Pt from motel via [facility] c/o generalized chronic abdominal pain. Pt seen PCP today and was told if not better come to ED. Pt had episode of bowel incontinence. Pt was wheeled in wheelchair to restroom and provided with new pants and clothes to clean self.

## 2015-05-11 ENCOUNTER — Inpatient Hospital Stay (HOSPITAL_COMMUNITY): Payer: Medicaid Other

## 2015-05-11 DIAGNOSIS — Z885 Allergy status to narcotic agent status: Secondary | ICD-10-CM | POA: Diagnosis not present

## 2015-05-11 DIAGNOSIS — G8929 Other chronic pain: Secondary | ICD-10-CM | POA: Diagnosis present

## 2015-05-11 DIAGNOSIS — Z9049 Acquired absence of other specified parts of digestive tract: Secondary | ICD-10-CM | POA: Diagnosis present

## 2015-05-11 DIAGNOSIS — N390 Urinary tract infection, site not specified: Secondary | ICD-10-CM | POA: Diagnosis present

## 2015-05-11 DIAGNOSIS — Z79899 Other long term (current) drug therapy: Secondary | ICD-10-CM | POA: Diagnosis not present

## 2015-05-11 DIAGNOSIS — R1030 Lower abdominal pain, unspecified: Secondary | ICD-10-CM | POA: Diagnosis present

## 2015-05-11 DIAGNOSIS — Z882 Allergy status to sulfonamides status: Secondary | ICD-10-CM | POA: Diagnosis not present

## 2015-05-11 DIAGNOSIS — K5909 Other constipation: Secondary | ICD-10-CM | POA: Diagnosis not present

## 2015-05-11 DIAGNOSIS — M545 Low back pain: Secondary | ICD-10-CM | POA: Diagnosis present

## 2015-05-11 DIAGNOSIS — Z888 Allergy status to other drugs, medicaments and biological substances status: Secondary | ICD-10-CM | POA: Diagnosis not present

## 2015-05-11 DIAGNOSIS — Z1612 Extended spectrum beta lactamase (ESBL) resistance: Secondary | ICD-10-CM | POA: Diagnosis present

## 2015-05-11 DIAGNOSIS — F319 Bipolar disorder, unspecified: Secondary | ICD-10-CM | POA: Diagnosis present

## 2015-05-11 DIAGNOSIS — E669 Obesity, unspecified: Secondary | ICD-10-CM | POA: Diagnosis present

## 2015-05-11 DIAGNOSIS — K5900 Constipation, unspecified: Secondary | ICD-10-CM | POA: Diagnosis present

## 2015-05-11 DIAGNOSIS — A499 Bacterial infection, unspecified: Secondary | ICD-10-CM | POA: Diagnosis not present

## 2015-05-11 DIAGNOSIS — Z6841 Body Mass Index (BMI) 40.0 and over, adult: Secondary | ICD-10-CM | POA: Diagnosis not present

## 2015-05-11 DIAGNOSIS — Z7982 Long term (current) use of aspirin: Secondary | ICD-10-CM | POA: Diagnosis not present

## 2015-05-11 DIAGNOSIS — B9620 Unspecified Escherichia coli [E. coli] as the cause of diseases classified elsewhere: Secondary | ICD-10-CM | POA: Diagnosis present

## 2015-05-11 LAB — CBC
HCT: 41.7 % (ref 36.0–46.0)
Hemoglobin: 13.8 g/dL (ref 12.0–15.0)
MCH: 28.9 pg (ref 26.0–34.0)
MCHC: 33.1 g/dL (ref 30.0–36.0)
MCV: 87.2 fL (ref 78.0–100.0)
Platelets: 146 10*3/uL — ABNORMAL LOW (ref 150–400)
RBC: 4.78 MIL/uL (ref 3.87–5.11)
RDW: 13.6 % (ref 11.5–15.5)
WBC: 5.1 10*3/uL (ref 4.0–10.5)

## 2015-05-11 LAB — BASIC METABOLIC PANEL
Anion gap: 7 (ref 5–15)
BUN: 10 mg/dL (ref 6–20)
CO2: 27 mmol/L (ref 22–32)
Calcium: 9.2 mg/dL (ref 8.9–10.3)
Chloride: 108 mmol/L (ref 101–111)
Creatinine, Ser: 0.76 mg/dL (ref 0.44–1.00)
GFR calc Af Amer: >60 mL/min (ref >60)
GFR calc non Af Amer: >60 mL/min (ref >60)
Glucose, Bld: 103 mg/dL — ABNORMAL HIGH (ref 65–99)
Potassium: 4.3 mmol/L (ref 3.5–5.1)
Sodium: 142 mmol/L (ref 135–145)

## 2015-05-11 MED ORDER — LURASIDONE HCL 40 MG PO TABS
20.0000 mg | ORAL_TABLET | Freq: Every day | ORAL | Status: DC
  Administered 2015-05-11 – 2015-05-12 (×2): 20 mg via ORAL
  Filled 2015-05-11 (×2): qty 1

## 2015-05-11 MED ORDER — SODIUM CHLORIDE 0.9 % IV SOLN: 250.0000 mL | INTRAVENOUS | Status: DC | PRN

## 2015-05-11 MED ORDER — PEG-KCL-NACL-NASULF-NA ASC-C 100 G PO SOLR: 1.0000 | Freq: Once | ORAL | Status: DC

## 2015-05-11 MED ORDER — ENOXAPARIN SODIUM 60 MG/0.6ML ~~LOC~~ SOLN
60.0000 mg | SUBCUTANEOUS | Status: DC
  Administered 2015-05-11 – 2015-05-12 (×2): 60 mg via SUBCUTANEOUS
  Filled 2015-05-11 (×2): qty 0.6

## 2015-05-11 MED ORDER — FOLIC ACID 1 MG PO TABS
1.0000 mg | ORAL_TABLET | Freq: Every day | ORAL | Status: DC
  Administered 2015-05-11 – 2015-05-12 (×2): 1 mg via ORAL
  Filled 2015-05-11 (×2): qty 1

## 2015-05-11 MED ORDER — ACETAMINOPHEN 325 MG PO TABS
650.0000 mg | ORAL_TABLET | Freq: Four times a day (QID) | ORAL | Status: DC | PRN
  Administered 2015-05-11: 650 mg via ORAL
  Filled 2015-05-11: qty 2

## 2015-05-11 MED ORDER — SODIUM CHLORIDE 0.9 % IV SOLN
500.0000 mg | Freq: Four times a day (QID) | INTRAVENOUS | Status: DC
Start: 2015-05-11 — End: 2015-05-12
  Administered 2015-05-11 – 2015-05-12 (×6): 500 mg via INTRAVENOUS
  Filled 2015-05-11 (×7): qty 500

## 2015-05-11 MED ORDER — ONDANSETRON HCL 4 MG/2ML IJ SOLN: 4.0000 mg | Freq: Four times a day (QID) | INTRAMUSCULAR | Status: DC | PRN

## 2015-05-11 MED ORDER — PEG-KCL-NACL-NASULF-NA ASC-C 100 G PO SOLR: 0.5000 | Freq: Once | ORAL | Status: AC | PRN

## 2015-05-11 MED ORDER — ASPIRIN 325 MG PO TABS
325.0000 mg | ORAL_TABLET | Freq: Every day | ORAL | Status: DC | PRN
  Administered 2015-05-11: 325 mg via ORAL
  Filled 2015-05-11 (×2): qty 1

## 2015-05-11 MED ORDER — SODIUM CHLORIDE 0.9 % IV SOLN
500.0000 mg | Freq: Once | INTRAVENOUS | Status: AC
  Administered 2015-05-11: 500 mg via INTRAVENOUS
  Filled 2015-05-11: qty 500

## 2015-05-11 MED ORDER — SODIUM CHLORIDE 0.9 % IJ SOLN
3.0000 mL | Freq: Two times a day (BID) | INTRAMUSCULAR | Status: DC
  Administered 2015-05-11 – 2015-05-12 (×3): 3 mL via INTRAVENOUS

## 2015-05-11 MED ORDER — ONDANSETRON HCL 4 MG PO TABS: 4.0000 mg | ORAL_TABLET | Freq: Four times a day (QID) | ORAL | Status: DC | PRN

## 2015-05-11 MED ORDER — SODIUM CHLORIDE 0.9 % IJ SOLN: 3.0000 mL | INTRAMUSCULAR | Status: DC | PRN

## 2015-05-11 MED ORDER — VITAMIN B-1 100 MG PO TABS
250.0000 mg | ORAL_TABLET | Freq: Every day | ORAL | Status: DC
  Administered 2015-05-11 – 2015-05-12 (×2): 250 mg via ORAL
  Filled 2015-05-11 (×2): qty 1

## 2015-05-11 MED ORDER — POLYETHYLENE GLYCOL 3350 17 G PO PACK
17.0000 g | PACK | Freq: Every day | ORAL | Status: DC | PRN
Start: 2015-05-11 — End: 2015-05-12
  Administered 2015-05-11: 17 g via ORAL
  Filled 2015-05-11: qty 1

## 2015-05-11 MED ORDER — PEG-KCL-NACL-NASULF-NA ASC-C 100 G PO SOLR
0.5000 | Freq: Once | ORAL | Status: AC
  Administered 2015-05-11: 100 g via ORAL
  Filled 2015-05-11: qty 1

## 2015-05-11 MED ORDER — ACETAMINOPHEN 650 MG RE SUPP: 650.0000 mg | Freq: Four times a day (QID) | RECTAL | Status: DC | PRN

## 2015-05-11 NOTE — Care Management Note (Signed)
Case Management Note  Patient Details  Name: Jordan [name] MRN: [id number] Date of Birth: 10/04/1966  Subjective/Objective:              E.coli uti with failed outpt treatment      Action/Plan: home  Expected Discharge Date:       [id number]           Expected Discharge Plan:  Home/Self Care  In-House Referral:  NA  Discharge planning Services  CM Consult  Post Acute Care Choice:  NA Choice offered to:  NA  DME Arranged:  N/A DME Agency:  NA  HH Arranged:  NA HH Agency:  NA  Status of Service:  In process, will continue to follow  Medicare Important Message Given:    Date Medicare IM Given:    Medicare IM give by:    Date Additional Medicare IM Given:    Additional Medicare Important Message give by:     If discussed at [facility] [facility] of Stay Meetings, dates discussed:    Additional Comments:  Leeroy Cha, RN 05/11/2015, 2:24 PM

## 2015-05-11 NOTE — H&P (Signed)
[facility]  History and Physical Gavriel Holzhauer L. Ardeth Perfect, MD Pager [phone] (if 7P to 7A, page night hospitalist on [email or web address] Brecheen IFO:[id number] DOB: 12-25-1965 DOA: 05/10/2015  Referring physician: EDP  PCP: [name], FNP   Chief Complaint: abdominal pain  HPI:  Pt w/ hx of ESBL UTIs presents w/ abdominal pain. She is found to have UTI on U/A . Given her prior culture data, she needs broad IV coverage w/ abx until culture returns. [facility] asked to admit   Chart Review:  ED notes and labs   Review of Systems:  abd pain present  Past Medical History  Diagnosis Date  . Mental disorder   . Bipolar 1 disorder   . Bipolar 1 disorder   . Yeast infection of the vagina   . Bipolar disorder   . Obesity   . UTI (lower urinary tract infection)   . Alcohol abuse   . Shortness of breath dyspnea   . Anemia   . 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 Cholecystectomy      Social History:  reports that she has never smoked. She has never used smokeless tobacco. She reports that she does not drink alcohol or use illicit drugs.  Allergies  Allergen Reactions  . Tramadol Anaphylaxis, Shortness Of Breath and Swelling  . Sulfa Antibiotics Swelling  . Citalopram     Possible cause of pancytopenia  . Lamotrigine     Possible cause of pancytopenia    No family history on file.   Prior to Admission medications   Medication Sig Start Date End Date Taking? Authorizing Provider  albuterol (PROVENTIL HFA;VENTOLIN HFA) 108 (90 BASE) MCG/ACT inhaler Inhale 1 puff into the lungs every 6 (six) hours as needed for wheezing or shortness of breath.   Yes Historical Provider, MD  aspirin 325 MG tablet Take 325 mg by mouth daily as needed for mild pain.   Yes Historical Provider, MD  cyanocobalamin (,VITAMIN B-12,) 1000 MCG/ML injection Inject subcutaneously q Daily till 02/27/15,then, Inject subcutaneously  q weekly for 4 weeks from 02/28/15, Then from 03/29/15-inject subcutaneously q monthly and stay on it Patient taking differently: Inject 1,000 mcg into the skin every 30 (thirty) days. Next injection due 04/28/15 02/23/15  Yes Shanker Kristeen Mans, MD  folic acid (FOLVITE) 1 MG tablet Take 1 tablet (1 mg total) by mouth daily. 02/23/15  Yes Shanker Kristeen Mans, MD  Lurasidone HCl (LATUDA) 20 MG TABS Take 1 tablet by mouth daily.   Yes Historical Provider, MD  ondansetron (ZOFRAN) 4 MG tablet Take 1 tablet (4 mg total) by mouth every 6 (six) hours. 04/22/15  Yes Nicole Pisciotta, PA-C  Thiamine HCl (VITAMIN B-1) 250 MG tablet Take 250 mg by mouth daily.    Yes Historical Provider, MD  ALPRAZolam Duanne Moron) 0.5 MG tablet Take 1 tablet (0.5 mg total) by mouth 3 (three) times daily as needed for anxiety. Patient not taking: Reported on 05/10/2015 02/23/15   Jonetta Osgood, MD  cephALEXin (KEFLEX) 500 MG capsule Take 1 capsule (500 mg total) by mouth 4 (four) times daily. Patient not taking: Reported on 05/10/2015 04/10/15   Lacretia Leigh, MD  docusate sodium (COLACE) 100 MG capsule Take 1 capsule (100 mg total) by mouth every 12 (twelve) hours. Patient not taking: Reported on 05/10/2015 04/30/15   Orpah Greek, MD  lactulose (CHRONULAC) 10 GM/15ML solution Take 15 mLs (10 g total) by  mouth 2 (two) times daily as needed for mild constipation or moderate constipation. Patient not taking: Reported on 05/10/2015 04/30/15   Orpah Greek, MD  magnesium citrate SOLN Take 296 mLs (1 Bottle total) by mouth once. Patient not taking: Reported on 05/10/2015 04/30/15   Orpah Greek, MD  PARoxetine (PAXIL-CR) 25 MG 24 hr tablet Take 1 tablet (25 mg total) by mouth daily. Patient not taking: Reported on 04/21/2015 02/23/15   Jonetta Osgood, MD  QUEtiapine (SEROQUEL) 100 MG tablet Take 1 tablet (100 mg total) by mouth at bedtime. Patient not taking: Reported on 04/16/2015 02/23/15   Jonetta Osgood, MD  thiamine 100  MG tablet Take 1 tablet (100 mg total) by mouth daily. Patient not taking: Reported on 04/16/2015 02/23/15   Jonetta Osgood, MD   Physical Exam: Filed Vitals:   05/10/15 2131 05/10/15 2132 05/10/15 2347  BP:  103/56 136/87  Pulse:  115 105  Temp:  98.1 F (36.7 C) 98 F (36.7 C)  TempSrc:  Oral Oral  Resp:  20 18  SpO2: 95% 93% 94%     General:  WF in NAD   HEENT: anicteric, MMM  Cardiovascular: RRR, noMRG   Respiratory: ctab, wheezes diffusely in exp phase   Abdomen: soft, tender to palpation suprapubic region   Skin: warm,dry   Musculoskeletal: no focal deficits  Psychiatric:no signs of mania oranxiety   Neurologic: no focal deficits  Wt Readings from Last 3 Encounters:  04/30/15 312 lb (141.522 kg)  04/16/15 305 lb (138.347 kg)  03/17/15 306 lb 7 oz (139 kg)    Labs on Admission:  Basic Metabolic Panel:  Recent Labs Lab 05/10/15 2206  NA 142  K 4.0  CL 109  CO2 24  GLUCOSE 113*  BUN 10  CREATININE 0.60  CALCIUM 9.4    Liver Function Tests:  Recent Labs Lab 05/10/15 2206  AST 94*  ALT 69*  ALKPHOS 123  BILITOT 1.1  PROT 7.2  ALBUMIN 4.3    Recent Labs Lab 05/10/15 2206  LIPASE 27   No results for input(s): AMMONIA in the last 168 hours.  CBC:  Recent Labs Lab 05/10/15 2206  WBC 6.1  HGB 15.5*  HCT 45.9  MCV 87.3  PLT 183    Cardiac Enzymes: No results for input(s): CKTOTAL, CKMB, CKMBINDEX, TROPONINI in the last 168 hours.  Troponin (Point of Care Test) No results for input(s): TROPIPOC in the last 72 hours.  BNP (last 3 results) No results for input(s): PROBNP in the last 8760 hours.  CBG: No results for input(s): GLUCAP in the last 168 hours.   Radiological Exams on Admission: No results found.     Active Problems:   UTI (lower urinary tract infection)   Assessment/Plan 1. UTI - covering w/ -penum abx given hx of ESBL . Await culture data   Code Status: full  Family Communication: none Disposition  Plan/Anticipated LOS: 3 days   Time spent: 30  minutes  Velna Hatchet, MD  Internal Medicine Pager [phone] If 7PM-7AM, please contact night-coverage at [email or web address], password [facility] 05/11/2015, 12:41 AM

## 2015-05-11 NOTE — Progress Notes (Signed)
Date:  May 11, 2015 U.R. performed for needs and level of care. Will continue to follow for Case Management needs.  Rhonda Davis, RN, BSN, CCM   [phone] 

## 2015-05-11 NOTE — Progress Notes (Signed)
[facility] PROGRESS NOTE  Jordan [name] ZOX:[id number] DOB: [birth year] DOA: 05/10/2015 PCP: [name], FNP  Assessment/Plan: 1. UTI with hx ESBL ecoli 1. Pt is continued on primaxin 2. Recent UTI with ESBL ecoli, repeat urine cx pending 2. Constipation 1. Stool noted on abd xray 2. Will give cathartic 3. Bipolar 1. Seems stable 4. Abd pain 1. Suspect secondary to combination of UTI with constipation 5. DVT prophylaxis 1. Lovenox subQ  Code Status: Full Family Communication: Pt in room (indicate person spoken with, relationship, and if by phone, the number) Disposition Plan: Pending   Consultants:    Procedures:    Antibiotics:  primaxin 7/20>>> (indicate start date, and stop date if known)  HPI/Subjective: Still reports generalized abd pain  Objective: Filed Vitals:   05/11/15 0145 05/11/15 0507 05/11/15 1415 05/11/15 1513  BP: 117/82 125/74 125/101 156/86  Pulse: 104  100 102  Temp: 98.4 F (36.9 C) 98.1 F (36.7 C) 98 F (36.7 C)   TempSrc: Oral Oral Oral   Resp: 20 20 20 20   Height: 5' 8"  (1.727 m)     Weight: 138.665 kg (305 lb 11.2 oz)     SpO2: 100% 95% 96% 96%    Intake/Output Summary (Last 24 hours) at 05/11/15 1539 Last data filed at 05/11/15 1347  Gross per 24 hour  Intake    600 ml  Output      0 ml  Net    600 ml   Filed Weights   05/11/15 0145  Weight: 138.665 kg (305 lb 11.2 oz)    Exam:   General:  Awake, in nad  Cardiovascular: regular, s1, s2  Respiratory: normal resp effort, no wheezing  Abdomen: generally distended, decreased BS, generally tender w/o rebound  Musculoskeletal: perfused, no clubbing, no cyanosis   Data Reviewed: Basic Metabolic Panel:  Recent Labs Lab 05/10/15 2206 05/11/15 0457  NA 142 142  K 4.0 4.3  CL 109 108  CO2 24 27  GLUCOSE 113* 103*  BUN 10 10  CREATININE 0.60 0.76  CALCIUM 9.4 9.2   Liver Function Tests:  Recent Labs Lab 05/10/15 2206  AST 94*  ALT 69*   ALKPHOS 123  BILITOT 1.1  PROT 7.2  ALBUMIN 4.3    Recent Labs Lab 05/10/15 2206  LIPASE 27   No results for input(s): AMMONIA in the last 168 hours. CBC:  Recent Labs Lab 05/10/15 2206 05/11/15 0457  WBC 6.1 5.1  HGB 15.5* 13.8  HCT 45.9 41.7  MCV 87.3 87.2  PLT 183 146*   Cardiac Enzymes: No results for input(s): CKTOTAL, CKMB, CKMBINDEX, TROPONINI in the last 168 hours. BNP (last 3 results)  Recent Labs  03/03/15 0028  BNP 31.7    ProBNP (last 3 results) No results for input(s): PROBNP in the last 8760 hours.  CBG: No results for input(s): GLUCAP in the last 168 hours.  No results found for this or any previous visit (from the past 240 hour(s)).   Studies: Dg Abd Portable 1v  05/11/2015   CLINICAL DATA:  Lower abdominal pain for 2 days. Loose stools. Current UTI.  EXAM: PORTABLE ABDOMEN - 1 VIEW  COMPARISON:  04/30/2015  FINDINGS: Normal bowel gas pattern. Status postcholecystectomy. No evidence of renal or ureteral stones. Soft tissues are otherwise unremarkable.  No significant bony abnormality.  IMPRESSION: 1. No acute findings. No evidence of bowel obstruction or generalized adynamic ileus. No change from prior study.   Electronically Signed   By: Lajean Manes  M.D.   On: 05/11/2015 15:06    Scheduled Meds: . enoxaparin (LOVENOX) injection  60 mg Subcutaneous Q24H  . folic acid  1 mg Oral Daily  . imipenem-cilastatin  500 mg Intravenous 4 times per day  . lurasidone  20 mg Oral Daily  . peg 3350 powder  0.5 kit Oral Once  . sodium chloride  3 mL Intravenous Q12H  . vitamin B-1  250 mg Oral Daily   Continuous Infusions:   Active Problems:   Bipolar disorder   Abdominal pain   UTI (lower urinary tract infection)   ESBL (extended spectrum beta-lactamase) producing bacteria infection   Aubriana Ravelo, [facility] Pager [phone]. If 7PM-7AM, please contact night-coverage at [email or web address], password [facility] 05/11/2015, 3:39 PM  LOS: 0 days

## 2015-05-11 NOTE — Progress Notes (Signed)
ANTIBIOTIC CONSULT NOTE - INITIAL  Pharmacy Consult for Primaxin Indication: UTI, hx of ESBL UTI  Allergies  Allergen Reactions  . Tramadol Anaphylaxis, Shortness Of Breath and Swelling  . Sulfa Antibiotics Swelling  . Citalopram     Possible cause of pancytopenia  . Lamotrigine     Possible cause of pancytopenia    Patient Measurements:   Adjusted Body Weight:   Vital Signs: Temp: 98 F (36.7 C) (07/20 2347) Temp Source: Oral (07/20 2347) BP: 136/87 mmHg (07/20 2347) Pulse Rate: 105 (07/20 2347) Intake/Output from previous day:   Intake/Output from this shift:    Labs:  Recent Labs  05/10/15 2206  WBC 6.1  HGB 15.5*  PLT 183  CREATININE 0.60   Estimated Creatinine Clearance: 131.6 mL/min (by C-G formula based on Cr of 0.6). No results for input(s): VANCOTROUGH, VANCOPEAK, VANCORANDOM, GENTTROUGH, GENTPEAK, GENTRANDOM, TOBRATROUGH, TOBRAPEAK, TOBRARND, AMIKACINPEAK, AMIKACINTROU, AMIKACIN in the last 72 hours.   Microbiology: Recent Results (from the past 720 hour(s))  Urine culture     Status: None   Collection Time: 04/30/15 10:45 PM  Result Value Ref Range Status   Specimen Description URINE, CLEAN CATCH  Final   Special Requests NONE  Final   Culture   Final    >=100,000 COLONIES/mL ESCHERICHIA COLI Confirmed Extended Spectrum Beta-Lactamase Producer (ESBL)    Report Status 05/02/2015 FINAL  Final   Organism ID, Bacteria ESCHERICHIA COLI  Final      Susceptibility   Escherichia coli - MIC*    AMPICILLIN >=32 RESISTANT Resistant     CEFAZOLIN >=64 RESISTANT Resistant     CEFTRIAXONE >=64 RESISTANT Resistant     CIPROFLOXACIN >=4 RESISTANT Resistant     GENTAMICIN <=1 SENSITIVE Sensitive     IMIPENEM <=0.25 SENSITIVE Sensitive     NITROFURANTOIN <=16 SENSITIVE Sensitive     TRIMETH/SULFA >=320 RESISTANT Resistant     AMPICILLIN/SULBACTAM >=32 RESISTANT Resistant     PIP/TAZO 16 SENSITIVE Sensitive     * >=100,000 COLONIES/mL ESCHERICHIA COLI     Medical History: Past Medical History  Diagnosis Date  . Mental disorder   . Bipolar 1 disorder   . Bipolar 1 disorder   . Yeast infection of the vagina   . Bipolar disorder   . Obesity   . UTI (lower urinary tract infection)   . Alcohol abuse   . Shortness of breath dyspnea   . Anemia   . 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  MD wants primaxin until cultures return.  First dose of antibiotics already given.  Goal of Therapy:  Primaxin dosed based on patient weight and renal function   Plan:  Follow up culture results  Primaxin 500mg  iv q6hr  Jordan [name], Jordan [name] 05/11/2015,2:22 AM

## 2015-05-12 DIAGNOSIS — A499 Bacterial infection, unspecified: Secondary | ICD-10-CM

## 2015-05-12 DIAGNOSIS — N390 Urinary tract infection, site not specified: Principal | ICD-10-CM

## 2015-05-12 DIAGNOSIS — K5900 Constipation, unspecified: Secondary | ICD-10-CM

## 2015-05-12 DIAGNOSIS — K5909 Other constipation: Secondary | ICD-10-CM

## 2015-05-12 DIAGNOSIS — Z1612 Extended spectrum beta lactamase (ESBL) resistance: Secondary | ICD-10-CM

## 2015-05-12 MED ORDER — HEPARIN SOD (PORK) LOCK FLUSH 100 UNIT/ML IV SOLN
250.0000 [IU] | INTRAVENOUS | Status: AC | PRN
  Administered 2015-05-12: 250 [IU]

## 2015-05-12 MED ORDER — SODIUM CHLORIDE 0.9 % IJ SOLN
10.0000 mL | INTRAMUSCULAR | Status: DC | PRN
  Administered 2015-05-12: 10 mL
  Filled 2015-05-12: qty 40

## 2015-05-12 MED ORDER — SODIUM CHLORIDE 0.9 % IV SOLN: 500.0000 mg | Freq: Four times a day (QID) | INTRAVENOUS | Status: DC

## 2015-05-12 MED ORDER — POLYETHYLENE GLYCOL 3350 17 G PO PACK: 17.0000 g | PACK | Freq: Every day | ORAL | Status: DC | PRN

## 2015-05-12 MED ORDER — DOCUSATE SODIUM 100 MG PO CAPS: 100.0000 mg | ORAL_CAPSULE | Freq: Two times a day (BID) | ORAL | Status: DC

## 2015-05-12 NOTE — Progress Notes (Signed)
Patient left hospital with belongings via w/c and taxi. Virginia Rochester, RN

## 2015-05-12 NOTE — Care Management Note (Signed)
Case Management Note  Patient Details  Name: Jordan [name] MRN: [id number] Date of Birth: [birth year]  Subjective/Objective:                 Sepsis, pna   Action/Plan: home with iv abx through [facility]   Expected Discharge Date:                  Expected Discharge Plan:  [name]  In-House Referral:  NA  Discharge planning Services  CM Consult  Post Acute Care Choice:  NA Choice offered to:  NA  DME Arranged:  N/A DME Agency:  NA  HH Arranged:  NA HH Agency:  NA  Status of Service:  In process, will continue to follow  Medicare Important Message Given:    Date Medicare IM Given:    Medicare IM give by:    Date Additional Medicare IM Given:    Additional Medicare Important Message give by:     If discussed at [place] of Stay Meetings, dates discussed:    Additional Comments:  Leeroy Cha, RN 05/12/2015, 11:47 AM

## 2015-05-12 NOTE — Progress Notes (Signed)
Peripherally Inserted Central Catheter/Midline Placement  The IV Nurse has discussed with the patient and/or persons authorized to consent for the patient, the purpose of this procedure and the potential benefits and risks involved with this procedure.  The benefits include less needle sticks, lab draws from the catheter and patient may be discharged home with the catheter.  Risks include, but not limited to, infection, bleeding, blood clot (thrombus formation), and puncture of an artery; nerve damage and irregular heat beat.  Alternatives to this procedure were also discussed.  PICC/Midline Placement Documentation  PICC / Midline Single Lumen 05/12/15 PICC Right Cephalic 43 cm 2 cm (Active)  Indication for Insertion or Continuance of Line Home intravenous therapies (PICC only) 05/12/2015  3:00 PM  Exposed Catheter (cm) 2 cm 05/12/2015  3:00 PM  Dressing Change Due 05/19/15 05/12/2015  3:00 PM       Sharday, Michl Horton 05/12/2015, 3:36 PM

## 2015-05-12 NOTE — Progress Notes (Signed)
Date:  May 12, 2015 [facility] notified of iv home abx need for discharge.  Velva Harman, RN, BSN, [place]   [phone]

## 2015-05-12 NOTE — Discharge Summary (Signed)
Physician Discharge Summary  Jordan [name] VZC:[id number] DOB: October 09, 1966 DOA: 05/10/2015  PCP: [name], FNP  Admit date: 05/10/2015 Discharge date: 05/12/2015  Time spent: 20 minutes  Recommendations for Outpatient Follow-up:  1. Follow up with PCP in 1-2 weeks 2. Please remove PICC after completing IV antibiotic (anticipated through 7/29)  Discharge Diagnoses:  Principal Problem:   ESBL (extended spectrum beta-lactamase) producing bacteria infection Active Problems:   Bipolar disorder   Abdominal pain   UTI (lower urinary tract infection)   Constipation   Discharge Condition: Improved  Diet recommendation: Regular  Filed Weights   05/11/15 0145  Weight: 138.665 kg (305 lb 11.2 oz)    History of present illness:  Please see admit h and p from 7/21 for details. Briefly, pt presented with complaints of generalized abd pain in the setting of recently diagnosed ESBL ecoli UTI. The patient was admitted for further work up.  Hospital Course:  1. Patient recently found to have UTI with hx ESBL ecoli 1. While admitted the patient was continued on primaxin 2. The patient remained afebrile, without leukocytosis, and medically stable 3. Discussed with pharmacy. A PICC was placed and the patient will complete 7 days of imipenem on discharge 2. Constipation 1. Large amounts of stool noted on abd xray per my own read 2. The patient responded very well to osmotic cathartic with multiple stools overnight 3. Symptoms improved 3. Bipolar 1. Seems stable 4. Abd pain 1. Suspect secondary to combination of UTI with marked constipation 5. DVT prophylaxis 1. Lovenox subQ while admitted  Discharge Exam: Filed Vitals:   05/11/15 1415 05/11/15 1513 05/11/15 2101 05/12/15 0651  BP: 125/101 156/86 121/94 135/82  Pulse: 100 102 122 105  Temp: 98 F (36.7 C)  98.4 F (36.9 C) 97.9 F (36.6 C)  TempSrc: Oral  Oral Oral  Resp: 20 20 20 20   Height:      Weight:      SpO2: 96%  96% 96% 99%    General: Awake, in nad Cardiovascular: regular, s1, s2 Respiratory: normal resp effort, no wheezing  Discharge Instructions     Medication List    STOP taking these medications        ALPRAZolam 0.5 MG tablet  Commonly known as:  XANAX     cephALEXin 500 MG capsule  Commonly known as:  KEFLEX     lactulose 10 GM/15ML solution  Commonly known as:  CHRONULAC     magnesium citrate Soln     QUEtiapine 100 MG tablet  Commonly known as:  SEROQUEL      TAKE these medications        albuterol 108 (90 BASE) MCG/ACT inhaler  Commonly known as:  PROVENTIL HFA;VENTOLIN HFA  Inhale 1 puff into the lungs every 6 (six) hours as needed for wheezing or shortness of breath.     aspirin 325 MG tablet  Take 325 mg by mouth daily as needed for mild pain.     cyanocobalamin 1000 MCG/ML injection  Commonly known as:  (VITAMIN B-12)  Inject subcutaneously q Daily till 02/27/15,then, Inject subcutaneously q weekly for 4 weeks from 02/28/15, Then from 03/29/15-inject subcutaneously q monthly and stay on it     docusate sodium 100 MG capsule  Commonly known as:  COLACE  Take 1 capsule (100 mg total) by mouth every 12 (twelve) hours.     folic acid 1 MG tablet  Commonly known as:  FOLVITE  Take 1 tablet (1 mg total) by mouth daily.  imipenem-cilastatin 500 mg in sodium chloride 0.9 % 100 mL  Inject 500 mg into the vein every 6 (six) hours.     LATUDA 20 MG Tabs  Generic drug:  Lurasidone HCl  Take 1 tablet by mouth daily.     ondansetron 4 MG tablet  Commonly known as:  ZOFRAN  Take 1 tablet (4 mg total) by mouth every 6 (six) hours.     PARoxetine 25 MG 24 hr tablet  Commonly known as:  PAXIL-CR  Take 1 tablet (25 mg total) by mouth daily.     polyethylene glycol packet  Commonly known as:  MIRALAX / GLYCOLAX  Take 17 g by mouth daily as needed for mild constipation.     vitamin B-1 250 MG tablet  Take 250 mg by mouth daily.       Allergies  Allergen  Reactions  . Tramadol Anaphylaxis, Shortness Of Breath and Swelling  . Sulfa Antibiotics Swelling  . Citalopram     Possible cause of pancytopenia  . Lamotrigine     Possible cause of pancytopenia      The results of significant diagnostics from this hospitalization (including imaging, microbiology, ancillary and laboratory) are listed below for reference.    Significant Diagnostic Studies: Dg Chest 2 View  04/21/2015   CLINICAL DATA:  49 year old female with nausea and vomiting and diarrhea.  EXAM: CHEST  2 VIEW  COMPARISON:  Radiograph dated 04/16/2015  FINDINGS: The heart size and mediastinal contours are within normal limits. Both lungs are clear. The visualized skeletal structures are unremarkable.  IMPRESSION: No focal consolidation.   No interval change.   Electronically Signed   By: Anner Crete M.D.   On: 04/21/2015 22:55   Dg Chest 2 View  04/16/2015   CLINICAL DATA:  Cough.  EXAM: CHEST  2 VIEW  COMPARISON:  04/10/2015  FINDINGS: Lung volumes are low. Progressive bronchial thickening. The cardiomediastinal contours are normal. Pulmonary vasculature is normal. No consolidation, pleural effusion, or pneumothorax. No acute osseous abnormalities are seen.  IMPRESSION: Progressive bronchial thickening, suspect bronchitis.   Electronically Signed   By: Jeb Levering M.D.   On: 04/16/2015 20:47   Dg Abd Acute W/chest  04/30/2015   CLINICAL DATA:  49 year old female with lower abdominal pain  EXAM: DG ABDOMEN ACUTE W/ 1V CHEST  COMPARISON:  Chest radiograph dated 04/21/2015  FINDINGS: There is no evidence of dilated bowel loops or free intraperitoneal air. No radiopaque calculi or other significant radiographic abnormality is seen. Heart size and mediastinal contours are within normal limits. Both lungs are clear. Cholecystectomy clips. Moderate stool throughout the colon.  IMPRESSION: Negative abdominal radiographs.  No acute cardiopulmonary disease.   Electronically Signed   By: Anner Crete M.D.   On: 04/30/2015 22:59   Dg Abd Portable 1v  05/11/2015   CLINICAL DATA:  Lower abdominal pain for 2 days. Loose stools. Current UTI.  EXAM: PORTABLE ABDOMEN - 1 VIEW  COMPARISON:  04/30/2015  FINDINGS: Normal bowel gas pattern. Status postcholecystectomy. No evidence of renal or ureteral stones. Soft tissues are otherwise unremarkable.  No significant bony abnormality.  IMPRESSION: 1. No acute findings. No evidence of bowel obstruction or generalized adynamic ileus. No change from prior study.   Electronically Signed   By: Lajean Manes M.D.   On: 05/11/2015 15:06   US Abdomen Limited Ruq  04/22/2015   CLINICAL DATA:  Chest pain, nausea, vomiting, elevated liver function tests. Onset 5 days ago.  EXAM: US ABDOMEN  LIMITED - RIGHT UPPER QUADRANT  COMPARISON:  None.  FINDINGS: Gallbladder:  Prior cholecystectomy  Common bile duct:  Diameter: 4 mm, normal  Liver:  There is generalized echogenicity of the liver without focal lesion. This may represent fatty infiltration.  IMPRESSION: Increased echogenicity of the liver suggesting fatty infiltration. No focal liver lesions. No acute biliary abnormalities are evident.   Electronically Signed   By: Andreas Newport M.D.   On: 04/22/2015 00:33    Microbiology: No results found for this or any previous visit (from the past 240 hour(s)).   Labs: Basic Metabolic Panel:  Recent Labs Lab 05/10/15 2206 05/11/15 0457  NA 142 142  K 4.0 4.3  CL 109 108  CO2 24 27  GLUCOSE 113* 103*  BUN 10 10  CREATININE 0.60 0.76  CALCIUM 9.4 9.2   Liver Function Tests:  Recent Labs Lab 05/10/15 2206  AST 94*  ALT 69*  ALKPHOS 123  BILITOT 1.1  PROT 7.2  ALBUMIN 4.3    Recent Labs Lab 05/10/15 2206  LIPASE 27   No results for input(s): AMMONIA in the last 168 hours. CBC:  Recent Labs Lab 05/10/15 2206 05/11/15 0457  WBC 6.1 5.1  HGB 15.5* 13.8  HCT 45.9 41.7  MCV 87.3 87.2  PLT 183 146*   Cardiac Enzymes: No results for  input(s): CKTOTAL, CKMB, CKMBINDEX, TROPONINI in the last 168 hours. BNP: BNP (last 3 results)  Recent Labs  03/03/15 0028  BNP 31.7    ProBNP (last 3 results) No results for input(s): PROBNP in the last 8760 hours.  CBG: No results for input(s): GLUCAP in the last 168 hours.  Signed:  Ai Sonnenfeld, Orpah Melter  [facility] 05/12/2015, 9:43 AM

## 2015-05-14 LAB — URINE CULTURE: Culture: >=100000

## 2015-05-16 LAB — CULTURE, BLOOD (ROUTINE X 2)
Culture: NO GROWTH
Culture: NO GROWTH

## 2015-05-17 ENCOUNTER — Encounter (HOSPITAL_COMMUNITY): Payer: Self-pay

## 2015-05-17 ENCOUNTER — Inpatient Hospital Stay (HOSPITAL_COMMUNITY)
Admission: EM | Admit: 2015-05-17 | Discharge: 2015-05-19 | DRG: 315 | Disposition: A | Discharge status: Home Health | Payer: Medicaid Other | Attending: Internal Medicine | Admitting: Internal Medicine

## 2015-05-17 DIAGNOSIS — T80212A Local infection due to central venous catheter, initial encounter: Principal | ICD-10-CM | POA: Diagnosis present

## 2015-05-17 DIAGNOSIS — F310 Bipolar disorder, current episode hypomanic: Secondary | ICD-10-CM | POA: Diagnosis not present

## 2015-05-17 DIAGNOSIS — T80219A Unspecified infection due to central venous catheter, initial encounter: Secondary | ICD-10-CM | POA: Diagnosis not present

## 2015-05-17 DIAGNOSIS — Z1612 Extended spectrum beta lactamase (ESBL) resistance: Secondary | ICD-10-CM | POA: Diagnosis present

## 2015-05-17 DIAGNOSIS — A499 Bacterial infection, unspecified: Secondary | ICD-10-CM | POA: Diagnosis present

## 2015-05-17 DIAGNOSIS — Z888 Allergy status to other drugs, medicaments and biological substances status: Secondary | ICD-10-CM

## 2015-05-17 DIAGNOSIS — L03113 Cellulitis of right upper limb: Secondary | ICD-10-CM | POA: Diagnosis not present

## 2015-05-17 DIAGNOSIS — Z79899 Other long term (current) drug therapy: Secondary | ICD-10-CM

## 2015-05-17 DIAGNOSIS — N390 Urinary tract infection, site not specified: Secondary | ICD-10-CM | POA: Diagnosis present

## 2015-05-17 DIAGNOSIS — Z6841 Body Mass Index (BMI) 40.0 and over, adult: Secondary | ICD-10-CM

## 2015-05-17 DIAGNOSIS — Z809 Family history of malignant neoplasm, unspecified: Secondary | ICD-10-CM

## 2015-05-17 DIAGNOSIS — Z885 Allergy status to narcotic agent status: Secondary | ICD-10-CM

## 2015-05-17 DIAGNOSIS — E669 Obesity, unspecified: Secondary | ICD-10-CM | POA: Diagnosis present

## 2015-05-17 DIAGNOSIS — F319 Bipolar disorder, unspecified: Secondary | ICD-10-CM | POA: Diagnosis present

## 2015-05-17 DIAGNOSIS — Z7982 Long term (current) use of aspirin: Secondary | ICD-10-CM

## 2015-05-17 DIAGNOSIS — Z882 Allergy status to sulfonamides status: Secondary | ICD-10-CM

## 2015-05-17 DIAGNOSIS — Z9049 Acquired absence of other specified parts of digestive tract: Secondary | ICD-10-CM | POA: Diagnosis present

## 2015-05-17 HISTORY — DX: Unspecified infection due to central venous catheter, initial encounter: T80.219A

## 2015-05-17 LAB — CBC WITH DIFFERENTIAL/PLATELET
Basophils Absolute: 0 10*3/uL (ref 0.0–0.1)
Basophils Relative: 0 % (ref 0–1)
Eosinophils Absolute: 0 10*3/uL (ref 0.0–0.7)
Eosinophils Relative: 0 % (ref 0–5)
HCT: 44.4 % (ref 36.0–46.0)
Hemoglobin: 15 g/dL (ref 12.0–15.0)
Lymphocytes Relative: 29 % (ref 12–46)
Lymphs Abs: 1.7 10*3/uL (ref 0.7–4.0)
MCH: 29 pg (ref 26.0–34.0)
MCHC: 33.8 g/dL (ref 30.0–36.0)
MCV: 85.7 fL (ref 78.0–100.0)
Monocytes Absolute: 0.6 10*3/uL (ref 0.1–1.0)
Monocytes Relative: 11 % (ref 3–12)
Neutro Abs: 3.7 10*3/uL (ref 1.7–7.7)
Neutrophils Relative %: 60 % (ref 43–77)
Platelets: 166 10*3/uL (ref 150–400)
RBC: 5.18 MIL/uL — ABNORMAL HIGH (ref 3.87–5.11)
RDW: 13.4 % (ref 11.5–15.5)
WBC: 6 10*3/uL (ref 4.0–10.5)

## 2015-05-17 LAB — BASIC METABOLIC PANEL
Anion gap: 9 (ref 5–15)
BUN: 8 mg/dL (ref 6–20)
CO2: 26 mmol/L (ref 22–32)
Calcium: 9.5 mg/dL (ref 8.9–10.3)
Chloride: 105 mmol/L (ref 101–111)
Creatinine, Ser: 0.57 mg/dL (ref 0.44–1.00)
GFR calc Af Amer: >60 mL/min (ref >60)
GFR calc non Af Amer: >60 mL/min (ref >60)
Glucose, Bld: 92 mg/dL (ref 65–99)
Potassium: 3.7 mmol/L (ref 3.5–5.1)
Sodium: 140 mmol/L (ref 135–145)

## 2015-05-17 MED ORDER — SODIUM CHLORIDE 0.9 % IV SOLN
500.0000 mg | Freq: Four times a day (QID) | INTRAVENOUS | Status: DC
  Administered 2015-05-17 – 2015-05-19 (×7): 500 mg via INTRAVENOUS
  Filled 2015-05-17 (×9): qty 500

## 2015-05-17 MED ORDER — CLINDAMYCIN PHOSPHATE 600 MG/50ML IV SOLN
600.0000 mg | Freq: Once | INTRAVENOUS | Status: AC
  Administered 2015-05-18: 600 mg via INTRAVENOUS
  Filled 2015-05-17: qty 50

## 2015-05-17 NOTE — ED Notes (Signed)
Patient had a right upper arm PICC line inserted 6 days ago for IV antibiotics for a UTI.  Patient c/o pain and redness and swelling to the right upper arm that she noted today.

## 2015-05-17 NOTE — ED Notes (Signed)
IV team at bedside. PICC line to be pulled per MD. IV nurse will try to get peripheral IV.

## 2015-05-17 NOTE — ED Provider Notes (Signed)
CSN: [id number]     Arrival date & time 05/17/15  1835 History   First MD Initiated Contact with Patient 05/17/15 2032     Chief Complaint  Patient presents with  . Vascular Access Problem     (Consider location/radiation/quality/duration/timing/severity/associated sxs/prior Treatment) Patient is a 49 y.o. female presenting with rash.  Rash Location: right arm. Quality: painful and redness   Pain details:    Quality:  Hot   Severity:  Severe   Onset quality:  Gradual   Duration:  1 day   Timing:  Constant   Progression:  Worsening Timing:  Constant Progression:  Worsening Chronicity:  New Context comment:  Right arm PICC for abx for ESBL E coli UTI Relieved by:  Nothing Worsened by:  Contact Associated symptoms: no fever, no nausea and not vomiting     Past Medical History  Diagnosis Date  . Mental disorder   . Bipolar 1 disorder   . Bipolar 1 disorder   . Yeast infection of the vagina   . Bipolar disorder   . Obesity   . UTI (lower urinary tract infection)   . Alcohol abuse   . Shortness of breath dyspnea   . Anemia   . 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 Cholecystectomy     History reviewed. No pertinent family history. History  Substance Use Topics  . Smoking status: Never Smoker   . Smokeless tobacco: Never Used  . Alcohol Use: No   OB History    Gravida Para Term Preterm AB TAB SAB Ectopic Multiple Living   1 1 1       1      Review of Systems  Constitutional: Negative for fever.  Gastrointestinal: Negative for nausea and vomiting.  Skin: Positive for rash.  All other systems reviewed and are negative.     Allergies  Tramadol; Sulfa antibiotics; Citalopram; and Lamotrigine  Home Medications   Prior to Admission medications   Medication Sig Start Date End Date Taking? Authorizing Provider  albuterol (PROVENTIL HFA;VENTOLIN HFA) 108 (90 BASE) MCG/ACT inhaler  Inhale 1 puff into the lungs every 6 (six) hours as needed for wheezing or shortness of breath.   Yes Historical Provider, MD  aspirin 325 MG tablet Take 325 mg by mouth daily as needed for mild pain.   Yes Historical Provider, MD  clotrimazole-betamethasone (LOTRISONE) cream Apply 1 application topically 2 (two) times daily. 04/19/15  Yes Historical Provider, MD  cyanocobalamin (,VITAMIN B-12,) 1000 MCG/ML injection Inject subcutaneously q Daily till 02/27/15,then, Inject subcutaneously q weekly for 4 weeks from 02/28/15, Then from 03/29/15-inject subcutaneously q monthly and stay on it Patient taking differently: Inject 1,000 mcg into the skin every 30 (thirty) days. Next injection due 04/28/15 02/23/15  Yes Shanker Kristeen Mans, MD  folic acid (FOLVITE) 1 MG tablet Take 1 tablet (1 mg total) by mouth daily. 02/23/15  Yes Shanker Kristeen Mans, MD  imipenem-cilastatin 500 mg in sodium chloride 0.9 % 100 mL Inject 500 mg into the vein every 6 (six) hours. 05/12/15  Yes Donne Hazel, MD  lurasidone (LATUDA) 40 MG TABS tablet Take 40 mg by mouth daily with breakfast.   Yes Historical Provider, MD  ondansetron (ZOFRAN) 4 MG tablet Take 1 tablet (4 mg total) by mouth every 6 (six) hours. 04/22/15  Yes Nicole Pisciotta, PA-C  Thiamine HCl (VITAMIN B-1) 250 MG tablet Take 250 mg by mouth daily.  Yes Historical Provider, MD  docusate sodium (COLACE) 100 MG capsule Take 1 capsule (100 mg total) by mouth every 12 (twelve) hours. Patient not taking: Reported on 05/17/2015 05/12/15   Donne Hazel, MD  PARoxetine (PAXIL-CR) 25 MG 24 hr tablet Take 1 tablet (25 mg total) by mouth daily. Patient not taking: Reported on 04/21/2015 02/23/15   Jonetta Osgood, MD  polyethylene glycol [facility] / Floria Raveling) packet Take 17 g by mouth daily as needed for mild constipation. Patient not taking: Reported on 05/17/2015 05/12/15   Donne Hazel, MD   BP 137/91 mmHg  Pulse 98  Temp(Src) 97.6 F (36.4 C) (Oral)  Resp 18  Ht 5\' 8"  (1.727 m)   Wt 308 lb (139.708 kg)  BMI 46.84 kg/m2  SpO2 95%  LMP 09/13/2014 Physical Exam  Constitutional: She is oriented to person, place, and time. She appears well-developed and well-nourished. No distress.  HENT:  Head: Normocephalic and atraumatic.  Eyes: Conjunctivae are normal. No scleral icterus.  Neck: Neck supple.  Cardiovascular: Normal rate and intact distal pulses.   Pulmonary/Chest: Effort normal. No stridor. No respiratory distress.  Abdominal: Normal appearance. She exhibits no distension.  Musculoskeletal:       Arms: Neurological: She is alert and oriented to person, place, and time.  Skin: Skin is warm and dry. No rash noted.  Psychiatric: She has a normal mood and affect. Her behavior is normal.  Nursing note and vitals reviewed.   ED Course  Procedures (including critical care time) Labs Review Labs Reviewed  CBC WITH DIFFERENTIAL/PLATELET - Abnormal; Notable for the following:    RBC 5.18 (*)    All other components within normal limits  CULTURE, BLOOD (SINGLE)  CATH TIP CULTURE  BASIC METABOLIC PANEL    Imaging Review No results found.   EKG Interpretation None      MDM   Final diagnoses:  Cellulitis of right upper extremity    Cellulitis associated with PICC line.  Line was pulled by IV team.  Clindamycin for cellulitis.  She is also receiving IV imipenem-cilastin due to ESBL E Coli UTI.  She needs another 6+ doses of this.  She will need admission for IV abx until further arrangements can be made.      Serita Grit, MD 05/17/15 [phone]

## 2015-05-17 NOTE — H&P (Addendum)
[facility] Admission History and Physical       [name] STM:[id number] DOB: September 20, 1966 DOA: 05/17/2015  Referring physician: EDP PCP: [name], FNP  Specialists:   Chief Complaint: Redness Right ARM  HPI: Jordan [name] is a 49 y.o. female with a history of Bipolar Disorder, and recent diagnosis of ESBL E.coli placed on IV Primaxin via PICC line x 7 days ( from 07/22) who presents to the ED with complaints of increased redensss at the site of her [name] since the AM.  She denies any fevers or chills.    In the ED, the PICC line was removed and cultured and she was placed on IV Clindamycin rx.      Review of Systems:  Constitutional: No Weight Loss, No Weight Gain, Night Sweats, Fevers, Chills, Dizziness, Light Headedness, Fatigue, or Generalized Weakness HEENT: No Headaches, Difficulty Swallowing,Tooth/Dental Problems,Sore Throat,  No Sneezing, Rhinitis, Ear Ache, Nasal Congestion, or Post Nasal Drip,  Cardio-vascular:  No Chest pain, Orthopnea, PND, Edema in Lower Extremities, Anasarca, Dizziness, Palpitations  Resp: No Dyspnea, No DOE, No Productive Cough, No Non-Productive Cough, No Hemoptysis, No Wheezing.    GI: No Heartburn, Indigestion, Abdominal Pain, Nausea, Vomiting, Diarrhea, Constipation, Hematemesis, Hematochezia, Melena, Change in Bowel Habits,  Loss of Appetite  GU: No Dysuria, No Change in Color of Urine, No Urgency or Urinary Frequency, No Flank pain.  Musculoskeletal:  No Joint Pain or Swelling, No Decreased Range of Motion, No Back Pain.  Neurologic: No Syncope, No Seizures, Muscle Weakness, Paresthesia, Vision Disturbance or Loss, No Diplopia, No Vertigo, No Difficulty Walking,  Skin: Redness Right Upper ARM,  Psych: No Change in Mood or Affect, No Depression or Anxiety, No Memory loss, No Confusion, or Hallucinations   Past Medical History  Diagnosis Date  . Mental disorder   . Bipolar 1 disorder   . Bipolar 1 disorder   . Yeast  infection of the vagina   . Bipolar disorder   . Obesity   . UTI (lower urinary tract infection)   . Alcohol abuse   . Shortness of breath dyspnea   . Anemia   . 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 Cholecystectomy        Prior to Admission medications   Medication Sig Start Date End Date Taking? Authorizing Provider  albuterol (PROVENTIL HFA;VENTOLIN HFA) 108 (90 BASE) MCG/ACT inhaler Inhale 1 puff into the lungs every 6 (six) hours as needed for wheezing or shortness of breath.   Yes Historical Provider, MD  aspirin 325 MG tablet Take 325 mg by mouth daily as needed for mild pain.   Yes Historical Provider, MD  clotrimazole-betamethasone (LOTRISONE) cream Apply 1 application topically 2 (two) times daily. 04/19/15  Yes Historical Provider, MD  cyanocobalamin (,VITAMIN B-12,) 1000 MCG/ML injection Inject subcutaneously q Daily till 02/27/15,then, Inject subcutaneously q weekly for 4 weeks from 02/28/15, Then from 03/29/15-inject subcutaneously q monthly and stay on it Patient taking differently: Inject 1,000 mcg into the skin every 30 (thirty) days. Next injection due 04/28/15 02/23/15  Yes Shanker Kristeen Mans, MD  folic acid (FOLVITE) 1 MG tablet Take 1 tablet (1 mg total) by mouth daily. 02/23/15  Yes Shanker Kristeen Mans, MD  imipenem-cilastatin 500 mg in sodium chloride 0.9 % 100 mL Inject 500 mg into the vein every 6 (six) hours. 05/12/15  Yes Donne Hazel, MD  lurasidone (LATUDA) 40 MG TABS tablet  Take 40 mg by mouth daily with breakfast.   Yes Historical Provider, MD  ondansetron (ZOFRAN) 4 MG tablet Take 1 tablet (4 mg total) by mouth every 6 (six) hours. 04/22/15  Yes Nicole Pisciotta, PA-C  Thiamine HCl (VITAMIN B-1) 250 MG tablet Take 250 mg by mouth daily.    Yes Historical Provider, MD  docusate sodium (COLACE) 100 MG capsule Take 1 capsule (100 mg total) by mouth every 12 (twelve) hours. Patient not taking:  Reported on 05/17/2015 05/12/15   Donne Hazel, MD  PARoxetine (PAXIL-CR) 25 MG 24 hr tablet Take 1 tablet (25 mg total) by mouth daily. Patient not taking: Reported on 04/21/2015 02/23/15   Jonetta Osgood, MD  polyethylene glycol [facility] / Floria Raveling) packet Take 17 g by mouth daily as needed for mild constipation. Patient not taking: Reported on 05/17/2015 05/12/15   Donne Hazel, MD     Allergies  Allergen Reactions  . Tramadol Anaphylaxis, Shortness Of Breath and Swelling  . Sulfa Antibiotics Swelling  . Citalopram     Possible cause of pancytopenia  . Lamotrigine     Possible cause of pancytopenia    Social History:  reports that she has never smoked. She has never used smokeless tobacco. She reports that she does not drink alcohol or use illicit drugs.     Family History:     Father had Cancer Unknown Type   Physical Exam:  GEN:  Pleasant Obese 49 y.o. Caucasian female examined and in no acute distress; cooperative with exam Filed Vitals:   05/17/15 1905 05/17/15 2121 05/17/15 2247  BP: 128/87 137/91 114/64  Pulse: 117 98 99  Temp: 97.9 F (36.6 C) 97.6 F (36.4 C) 97.8 F (36.6 C)  TempSrc: Oral Oral Oral  Resp: 18 18 20   Height: 5\' 8"  (1.727 m)    Weight: 139.708 kg (308 lb)    SpO2: 96% 95% 98%   Blood pressure 114/64, pulse 99, temperature 97.8 F (36.6 C), temperature source Oral, resp. rate 20, height 5\' 8"  (1.727 m), weight 139.708 kg (308 lb), last menstrual period 09/13/2014, SpO2 98 %. PSYCH: She is alert and oriented x4; does not appear anxious does not appear depressed; affect is normal HEENT: Normocephalic and Atraumatic, Mucous membranes pink; PERRLA; EOM intact; Fundi:  Benign;  No scleral icterus, Nares: Patent, Oropharynx: Clear, Fair Dentition,    Neck:  FROM, No Cervical Lymphadenopathy nor Thyromegaly or Carotid Bruit; No JVD; Breasts:: Not examined CHEST WALL: No tenderness CHEST: Normal respiration, clear to auscultation bilaterally HEART:  Regular rate and rhythm; no murmurs rubs or gallops BACK: No kyphosis or scoliosis; No CVA tenderness ABDOMEN: Positive Bowel Sounds, Obese, Soft Non-Tender, No Rebound or Guarding; No Masses, No Organomegaly, No Pannus; No Intertriginous candida. Rectal Exam: Not done EXTREMITIES: No Cyanosis, Clubbing, or Edema; No Ulcerations. Genitalia: not examined PULSES: 2+ and symmetric SKIN: Normal hydration no rash or ulceration CNS:  Alert and Oriented x 4, No Focal Deficits Vascular: pulses palpable throughout    Labs on Admission:  Basic Metabolic Panel:  Recent Labs Lab 05/11/15 0457 05/17/15 2232  NA 142 140  K 4.3 3.7  CL 108 105  CO2 27 26  GLUCOSE 103* 92  BUN 10 8  CREATININE 0.76 0.57  CALCIUM 9.2 9.5   Liver Function Tests: No results for input(s): AST, ALT, ALKPHOS, BILITOT, PROT, ALBUMIN in the last 168 hours. No results for input(s): LIPASE, AMYLASE in the last 168 hours. No results for input(s): AMMONIA  in the last 168 hours. CBC:  Recent Labs Lab 05/11/15 0457 05/17/15 2232  WBC 5.1 6.0  NEUTROABS  --  3.7  HGB 13.8 15.0  HCT 41.7 44.4  MCV 87.2 85.7  PLT 146* 166   Cardiac Enzymes: No results for input(s): CKTOTAL, CKMB, CKMBINDEX, TROPONINI in the last 168 hours.  BNP (last 3 results)  Recent Labs  03/03/15 0028  BNP 31.7    ProBNP (last 3 results) No results for input(s): PROBNP in the last 8760 hours.  CBG: No results for input(s): GLUCAP in the last 168 hours.  Radiological Exams on Admission: No results found.   EKG: Independently reviewed.    Assessment/Plan:   49 y.o. female with  Principal Problem:   1.     Cellulitis- of Right Arm due to PICC Line Infection   IV Clindamycin ordered        Active Problems:   2.    PICC line infection   PICC line Removed and Culture of Tip Sent   IV Clindamycin added     3.    ESBL (extended spectrum beta-lactamase) producing bacteria infection   IV Primaxin     4.    Bipolar  disorder   Continue Latuda and Paxil Rx     5.    DVT Prophylaxis   Lovenox          Code Status:     FULL CODE        Family Communication:   Significant Other at Bedside    Disposition Plan:    Inpatient Status        Time spent:  [place] [facility] Pager [phone]   If [place] Please Contact the Day Rounding Team MD for [facility]  If 7PM-7AM, Please Contact Night-Floor Coverage  [email or web address] Password [email or web address] 05/17/2015, 11:52 PM     ADDENDUM:   Patient was seen and examined on 05/17/2015

## 2015-05-17 NOTE — ED Notes (Signed)
Per EMS, pt brought in due to her PICC line being infected. PICC line site warm and red.

## 2015-05-18 DIAGNOSIS — A499 Bacterial infection, unspecified: Secondary | ICD-10-CM | POA: Diagnosis not present

## 2015-05-18 DIAGNOSIS — Z888 Allergy status to other drugs, medicaments and biological substances status: Secondary | ICD-10-CM | POA: Diagnosis not present

## 2015-05-18 DIAGNOSIS — Z1612 Extended spectrum beta lactamase (ESBL) resistance: Secondary | ICD-10-CM | POA: Diagnosis present

## 2015-05-18 DIAGNOSIS — Z885 Allergy status to narcotic agent status: Secondary | ICD-10-CM | POA: Diagnosis not present

## 2015-05-18 DIAGNOSIS — T80212A Local infection due to central venous catheter, initial encounter: Secondary | ICD-10-CM | POA: Diagnosis present

## 2015-05-18 DIAGNOSIS — Z882 Allergy status to sulfonamides status: Secondary | ICD-10-CM | POA: Diagnosis not present

## 2015-05-18 DIAGNOSIS — Z6841 Body Mass Index (BMI) 40.0 and over, adult: Secondary | ICD-10-CM | POA: Diagnosis not present

## 2015-05-18 DIAGNOSIS — Z9049 Acquired absence of other specified parts of digestive tract: Secondary | ICD-10-CM | POA: Diagnosis present

## 2015-05-18 DIAGNOSIS — E669 Obesity, unspecified: Secondary | ICD-10-CM | POA: Diagnosis present

## 2015-05-18 DIAGNOSIS — Z79899 Other long term (current) drug therapy: Secondary | ICD-10-CM | POA: Diagnosis not present

## 2015-05-18 DIAGNOSIS — L03113 Cellulitis of right upper limb: Secondary | ICD-10-CM | POA: Diagnosis present

## 2015-05-18 DIAGNOSIS — Z7982 Long term (current) use of aspirin: Secondary | ICD-10-CM | POA: Diagnosis not present

## 2015-05-18 DIAGNOSIS — M7989 Other specified soft tissue disorders: Secondary | ICD-10-CM | POA: Diagnosis present

## 2015-05-18 DIAGNOSIS — F310 Bipolar disorder, current episode hypomanic: Secondary | ICD-10-CM | POA: Diagnosis not present

## 2015-05-18 DIAGNOSIS — T80219D Unspecified infection due to central venous catheter, subsequent encounter: Secondary | ICD-10-CM | POA: Diagnosis not present

## 2015-05-18 DIAGNOSIS — N390 Urinary tract infection, site not specified: Secondary | ICD-10-CM | POA: Diagnosis present

## 2015-05-18 DIAGNOSIS — Z809 Family history of malignant neoplasm, unspecified: Secondary | ICD-10-CM | POA: Diagnosis not present

## 2015-05-18 DIAGNOSIS — F319 Bipolar disorder, unspecified: Secondary | ICD-10-CM | POA: Diagnosis present

## 2015-05-18 LAB — CBC
HCT: 41.6 % (ref 36.0–46.0)
Hemoglobin: 13.8 g/dL (ref 12.0–15.0)
MCH: 28.6 pg (ref 26.0–34.0)
MCHC: 33.2 g/dL (ref 30.0–36.0)
MCV: 86.3 fL (ref 78.0–100.0)
Platelets: 150 10*3/uL (ref 150–400)
RBC: 4.82 MIL/uL (ref 3.87–5.11)
RDW: 13.2 % (ref 11.5–15.5)
WBC: 5.5 10*3/uL (ref 4.0–10.5)

## 2015-05-18 LAB — BASIC METABOLIC PANEL
Anion gap: 8 (ref 5–15)
BUN: 8 mg/dL (ref 6–20)
CO2: 27 mmol/L (ref 22–32)
Calcium: 9.1 mg/dL (ref 8.9–10.3)
Chloride: 108 mmol/L (ref 101–111)
Creatinine, Ser: 0.59 mg/dL (ref 0.44–1.00)
GFR calc Af Amer: >60 mL/min (ref >60)
GFR calc non Af Amer: >60 mL/min (ref >60)
Glucose, Bld: 94 mg/dL (ref 65–99)
Potassium: 4 mmol/L (ref 3.5–5.1)
Sodium: 143 mmol/L (ref 135–145)

## 2015-05-18 LAB — MRSA PCR SCREENING: MRSA by PCR: NEGATIVE

## 2015-05-18 MED ORDER — SACCHAROMYCES BOULARDII 250 MG PO CAPS
250.0000 mg | ORAL_CAPSULE | Freq: Two times a day (BID) | ORAL | Status: DC
  Administered 2015-05-18 – 2015-05-19 (×4): 250 mg via ORAL
  Filled 2015-05-18 (×5): qty 1

## 2015-05-18 MED ORDER — ONDANSETRON HCL 4 MG/2ML IJ SOLN: 4.0000 mg | Freq: Four times a day (QID) | INTRAMUSCULAR | Status: DC | PRN

## 2015-05-18 MED ORDER — OXYCODONE HCL 5 MG PO TABS: 5.0000 mg | ORAL_TABLET | ORAL | Status: DC | PRN

## 2015-05-18 MED ORDER — ASPIRIN 325 MG PO TABS
325.0000 mg | ORAL_TABLET | Freq: Every day | ORAL | Status: DC | PRN
  Filled 2015-05-18: qty 1

## 2015-05-18 MED ORDER — VITAMIN B-1 50 MG PO TABS
250.0000 mg | ORAL_TABLET | Freq: Every day | ORAL | Status: DC
  Administered 2015-05-18 – 2015-05-19 (×2): 250 mg via ORAL
  Filled 2015-05-18 (×2): qty 1

## 2015-05-18 MED ORDER — PAROXETINE HCL ER 25 MG PO TB24
25.0000 mg | ORAL_TABLET | Freq: Every day | ORAL | Status: DC
  Administered 2015-05-18 – 2015-05-19 (×2): 25 mg via ORAL
  Filled 2015-05-18 (×2): qty 1

## 2015-05-18 MED ORDER — CLINDAMYCIN PHOSPHATE 600 MG/50ML IV SOLN
600.0000 mg | Freq: Three times a day (TID) | INTRAVENOUS | Status: DC
  Administered 2015-05-18 – 2015-05-19 (×5): 600 mg via INTRAVENOUS
  Filled 2015-05-18 (×7): qty 50

## 2015-05-18 MED ORDER — ENOXAPARIN SODIUM 30 MG/0.3ML ~~LOC~~ SOLN: 30.0000 mg | SUBCUTANEOUS | Status: DC

## 2015-05-18 MED ORDER — DOCUSATE SODIUM 100 MG PO CAPS
100.0000 mg | ORAL_CAPSULE | Freq: Two times a day (BID) | ORAL | Status: DC
  Administered 2015-05-19: 100 mg via ORAL
  Filled 2015-05-18 (×4): qty 1

## 2015-05-18 MED ORDER — FOLIC ACID 1 MG PO TABS
1.0000 mg | ORAL_TABLET | Freq: Every day | ORAL | Status: DC
  Administered 2015-05-18 – 2015-05-19 (×2): 1 mg via ORAL
  Filled 2015-05-18 (×2): qty 1

## 2015-05-18 MED ORDER — ACETAMINOPHEN 325 MG PO TABS: 650.0000 mg | ORAL_TABLET | Freq: Four times a day (QID) | ORAL | Status: DC | PRN

## 2015-05-18 MED ORDER — SODIUM CHLORIDE 0.9 % IV SOLN
INTRAVENOUS | Status: DC
  Administered 2015-05-18 (×2): via INTRAVENOUS

## 2015-05-18 MED ORDER — ENOXAPARIN SODIUM 80 MG/0.8ML ~~LOC~~ SOLN
70.0000 mg | Freq: Every day | SUBCUTANEOUS | Status: DC
  Administered 2015-05-18 – 2015-05-19 (×2): 70 mg via SUBCUTANEOUS
  Filled 2015-05-18 (×2): qty 0.8

## 2015-05-18 MED ORDER — HYDROMORPHONE HCL 1 MG/ML IJ SOLN: 0.5000 mg | INTRAMUSCULAR | Status: DC | PRN

## 2015-05-18 MED ORDER — ONDANSETRON HCL 4 MG PO TABS: 4.0000 mg | ORAL_TABLET | Freq: Four times a day (QID) | ORAL | Status: DC | PRN

## 2015-05-18 MED ORDER — ACETAMINOPHEN 650 MG RE SUPP: 650.0000 mg | Freq: Four times a day (QID) | RECTAL | Status: DC | PRN

## 2015-05-18 MED ORDER — LURASIDONE HCL 40 MG PO TABS
40.0000 mg | ORAL_TABLET | Freq: Every day | ORAL | Status: DC
  Administered 2015-05-18 – 2015-05-19 (×2): 40 mg via ORAL
  Filled 2015-05-18 (×3): qty 1

## 2015-05-18 MED ORDER — POLYETHYLENE GLYCOL 3350 17 G PO PACK: 17.0000 g | PACK | Freq: Every day | ORAL | Status: DC | PRN

## 2015-05-18 MED ORDER — ALUM & MAG HYDROXIDE-SIMETH 200-200-20 MG/5ML PO SUSP: 30.0000 mL | Freq: Four times a day (QID) | ORAL | Status: DC | PRN

## 2015-05-18 NOTE — Progress Notes (Signed)
Rx Brief Lovenox note  Wt=139 kg CrCl=130 ml/min BMI=46 Rx adjusted Lovenox to 70mg  daily in pt with BMI>30  Thanks Dorrene German 05/18/2015 1:24 AM

## 2015-05-18 NOTE — Care Management Note (Signed)
Case Management Note  Patient Details  Name: Jordan [name] MRN: [id number] Date of Birth: [birth year]  Subjective/Objective:                  Cellulitis of R arm  Action/Plan:  Discharge planning Expected Discharge Date:  05/20/15               Expected Discharge Plan:  [place]  In-House Referral:     Discharge planning Services  CM Consult  Post Acute Care Choice:  Resumption of Svcs/PTA Provider Choice offered to:     DME Arranged:    DME Agency:     HH Arranged:  RN [place] Agency:  [place]  Status of Service:  In process, will continue to follow  Medicare Important Message Given:    Date Medicare IM Given:    Medicare IM give by:    Date Additional Medicare IM Given:    Additional Medicare Important Message give by:     If discussed at [place] of Stay Meetings, dates discussed:    Additional Comments: CM notes pt active with [facility] for IV ABX; [facility] rep Kristen aware and monitoring.  CM will continue to monitor for disposition.   Dellie Catholic, RN 05/18/2015, 3:05 PM

## 2015-05-18 NOTE — Progress Notes (Signed)
[facility] PROGRESS NOTE  Jordan [name] UKG:[id number] DOB: September 06, 1966 DOA: 05/17/2015 PCP: [name], FNP  Brief Summary  Jordan [name] is a 49 y.o. female with a history of Bipolar Disorder, and recent diagnosis of ESBL E.coli placed on IV Primaxin via PICC line x 7 days ( from 07/22) who presented to the ED with redness at the site of her PICC line. She denied fevers. In the ED, the PICC line was removed and cultured and she was placed on IV Clindamycin rx.  Assessment/Plan  Cellulitis, due to possible PICC line infection, versus thrombophlebitis  -  Warm compresses -  Continue very Janett Kamath course of clindamycin -  Start florastor -  F/u PICC line culture  ESBL UTI -  Continue primaxim through tomorrow, then plan to discharge tomorrow afternoon  Bipolar disorder, stable, continue latuda and paxil  Obesity -  F/u with PCP regarding weight loss counseling  Diet:  regular Access:  PIV IVF:  off Proph:  lovenox  Code Status: full Family Communication: patient and her BF Disposition Plan:  Home tomorrow   Consultants:  none  Procedures:  PICC line removal 7/27  Antibiotics:  primaxin (continued from home 7/22 >> )  clinda 7/27 >   HPI/Subjective:  States her arm is less red and swollen.  She is feeling well and wants to go home.      Objective: Filed Vitals:   05/18/15 0035 05/18/15 0558 05/18/15 1001 05/18/15 1438  BP: 127/84 104/74 118/81 120/71  Pulse: 100 86 84 97  Temp: 98 F (36.7 C) 97.6 F (36.4 C) 98.8 F (37.1 C) 98.3 F (36.8 C)  TempSrc: Oral Oral Oral Oral  Resp: 18 18 18 18   Height:      Weight:      SpO2: 100% 100% 97% 97%    Intake/Output Summary (Last 24 hours) at 05/18/15 1445 Last data filed at 05/18/15 1439  Gross per 24 hour  Intake 1288.25 ml  Output    150 ml  Net 1138.25 ml   Filed Weights   05/17/15 1905  Weight: 139.708 kg (308 lb)   Body mass index is 46.84 kg/(m^2).  Exam:   General:  Obese  female, No acute distress  HEENT:  NCAT, MMM  Cardiovascular:  RRR, nl S1, S2 no mrg, 2+ pulses, warm extremities  Respiratory:  CTAB, no increased WOB  Abdomen:   NABS, soft, NT/ND  MSK:   Normal tone and bulk, no LEE Neuro:  Grossly intact Right arm with PICC line removed, mild light pink injection in 15cm span around PICC line insertion site which appears to have receded from previously drawn line  Data Reviewed: Basic Metabolic Panel:  Recent Labs Lab 05/17/15 2232 05/18/15 0623  NA 140 143  K 3.7 4.0  CL 105 108  CO2 26 27  GLUCOSE 92 94  BUN 8 8  CREATININE 0.57 0.59  CALCIUM 9.5 9.1   Liver Function Tests: No results for input(s): AST, ALT, ALKPHOS, BILITOT, PROT, ALBUMIN in the last 168 hours. No results for input(s): LIPASE, AMYLASE in the last 168 hours. No results for input(s): AMMONIA in the last 168 hours. CBC:  Recent Labs Lab 05/17/15 2232 05/18/15 0623  WBC 6.0 5.5  NEUTROABS 3.7  --   HGB 15.0 13.8  HCT 44.4 41.6  MCV 85.7 86.3  PLT 166 150    Recent Results (from the past 240 hour(s))  Urine culture     Status: None   Collection Time: 05/10/15  10:52 PM  Result Value Ref Range Status   Specimen Description URINE, RANDOM  Final   Special Requests NONE  Final   Culture   Final    >=100,000 COLONIES/mL ESCHERICHIA COLI Confirmed Extended Spectrum Beta-Lactamase Producer (ESBL) Performed at [facility]    Report Status 05/14/2015 FINAL  Final   Organism ID, Bacteria ESCHERICHIA COLI  Final      Susceptibility   Escherichia coli - MIC*    AMPICILLIN >=32 RESISTANT Resistant     CEFAZOLIN >=64 RESISTANT Resistant     CEFTRIAXONE 8 RESISTANT Resistant     CIPROFLOXACIN >=4 RESISTANT Resistant     GENTAMICIN <=1 SENSITIVE Sensitive     IMIPENEM <=0.25 SENSITIVE Sensitive     NITROFURANTOIN <=16 SENSITIVE Sensitive     TRIMETH/SULFA >=320 RESISTANT Resistant     AMPICILLIN/SULBACTAM >=32 RESISTANT Resistant     PIP/TAZO 8  SENSITIVE Sensitive     * >=100,000 COLONIES/mL ESCHERICHIA COLI  Blood culture (routine x 2)     Status: None   Collection Time: 05/11/15 12:12 AM  Result Value Ref Range Status   Specimen Description BLOOD RIGHT WRIST  Final   Special Requests BOTTLES DRAWN AEROBIC AND ANAEROBIC 2CC  Final   Culture   Final    NO GROWTH 5 DAYS Performed at [facility]    Report Status 05/16/2015 FINAL  Final  Blood culture (routine x 2)     Status: None   Collection Time: 05/11/15 12:21 AM  Result Value Ref Range Status   Specimen Description BLOOD LEFT HAND  Final   Special Requests BOTTLES DRAWN AEROBIC AND ANAEROBIC 5ML  Final   Culture   Final    NO GROWTH 5 DAYS Performed at [facility]    Report Status 05/16/2015 FINAL  Final  MRSA PCR Screening     Status: None   Collection Time: 05/18/15  1:44 AM  Result Value Ref Range Status   MRSA by PCR NEGATIVE NEGATIVE Final    Comment:        The GeneXpert MRSA Assay (FDA approved for NASAL specimens only), is one component of a comprehensive MRSA colonization surveillance program. It is not intended to diagnose MRSA infection nor to guide or monitor treatment for MRSA infections.      Studies: No results found.  Scheduled Meds: . clindamycin (CLEOCIN) IV  600 mg Intravenous 3 times per day  . docusate sodium  100 mg Oral Q12H  . enoxaparin (LOVENOX) injection  70 mg Subcutaneous Daily  . folic acid  1 mg Oral Daily  . imipenem-cilastatin (PRIMAXIN) 500 MG IV (MINIBAG PLUS)  500 mg Intravenous 4 times per day  . lurasidone  40 mg Oral Q breakfast  . PARoxetine  25 mg Oral Daily  . saccharomyces boulardii  250 mg Oral BID  . vitamin B-1  250 mg Oral Daily   Continuous Infusions: . sodium chloride 75 mL/hr at 05/18/15 1146    Principal Problem:   Cellulitis Active Problems:   Bipolar disorder   ESBL (extended spectrum beta-lactamase) producing bacteria infection   PICC line infection    Time spent: 30  min    Sasan Wilkie, [place] [facility] Pager [phone]. If 7PM-7AM, please contact night-coverage at [email or web address], password [facility] 05/18/2015, 2:45 PM  LOS: 0 days

## 2015-05-18 NOTE — Progress Notes (Signed)
[facility]  Patient Status: Active pt from [facility] on 05/12/15   [facility] is providing the following services: HHRN and Home Infusion Pharm[facility] team for home IV ABX.  [name] will follow Jordan [name] while here and support DC needs as deemed safe and appropriate in collaboration with the [facility] and hospital team. If patient discharges after hours, please call [phone].   [name] 05/18/2015, 9:55 AM

## 2015-05-19 MED ORDER — SACCHAROMYCES BOULARDII 250 MG PO CAPS: 250.0000 mg | ORAL_CAPSULE | Freq: Two times a day (BID) | ORAL | Status: DC

## 2015-05-19 NOTE — Discharge Summary (Signed)
Physician Discharge Summary  Adiya Selmer QBH:[id number] DOB: 01/30/1966 DOA: 05/17/2015  PCP: [name], FNP  Admit date: 05/17/2015 Discharge date: 05/19/2015  Recommendations for Outpatient Follow-up:  1. F/u with primary care doctor in 5 days for reevaluation of arm.  Please follow-up on final report of blood culture and catheter tip culture which are pending at the time of discharge.    Discharge Diagnoses:  Principal Problem:   Cellulitis Active Problems:   Bipolar disorder   ESBL (extended spectrum beta-lactamase) producing bacteria infection   PICC line infection   Discharge Condition: stable, improved  Diet recommendation:  Healthy heart  Wt Readings from Last 3 Encounters:  05/17/15 139.708 kg (308 lb)  05/11/15 138.665 kg (305 lb 11.2 oz)  04/30/15 141.522 kg (312 lb)    History of present illness:  Jordan [name] is a 49 y.o. female with a history of Bipolar Disorder, and recent diagnosis of ESBL E.coli placed on IV Primaxin via PICC line x 7 days ( from 07/22) who presented to the ED with redness at the site of her PICC line. She denied fevers. In the ED, the PICC line was removed and cultured and she was placed on IV Clindamycin rx.  Hospital Course:   Cellulitis, due to possible PICC line infection, versus thrombophlebitis.  Her PICC line was removed and the catheter tip cultured. Her culture is no growth to date as are her blood cultures. She was started on IV clindamycin and had quick improvement in erythema of her arm. I suspect that she probably had some thrombophlebitis which improved after removal of her PICC line. She was given 3 days of IV clindamycin and then her antibiotics were stopped. She was given a prescription for floor story to reduce her chance of C. difficile colitis.  ESBL UTI, completed Primaxin course in hospital.  Bipolar disorder, stable, continue latuda and paxil  Obesity, f/u with PCP regarding weight loss  counseling  Consultants:  none  Procedures:  PICC line removal 7/27  Antibiotics:  primaxin (continued from home 7/22 >> )  clinda 7/27 > 7/29  Discharge Exam: Filed Vitals:   05/19/15 1424  BP: 131/64  Pulse: 90  Temp: 98.2 F (36.8 C)  Resp: 18   Filed Vitals:   05/18/15 1826 05/18/15 2100 05/19/15 0536 05/19/15 1424  BP: 132/69 138/77 122/65 131/64  Pulse: 95 89 100 90  Temp: 98.4 F (36.9 C) 98.1 F (36.7 C) 98.1 F (36.7 C) 98.2 F (36.8 C)  TempSrc: Oral Oral Oral Oral  Resp: 18 18 20 18   Height:      Weight:      SpO2: 95% 94%  94%     General: Obese female, No acute distress  HEENT: NCAT, MMM  Cardiovascular: RRR, nl S1, S2 no mrg, 2+ pulses, warm extremities  Respiratory: CTAB, no increased WOB  Abdomen: NABS, soft, NT/ND  MSK: Normal tone and bulk, no LEE  Neuro: Grossly intact Right arm with PICC line removed, no erythema or induration or discharge  Discharge Instructions      Discharge Instructions    Call MD for:  difficulty breathing, headache or visual disturbances    Complete by:  As directed      Call MD for:  extreme fatigue    Complete by:  As directed      Call MD for:  hives    Complete by:  As directed      Call MD for:  persistant dizziness or light-headedness  Complete by:  As directed      Call MD for:  persistant nausea and vomiting    Complete by:  As directed      Call MD for:  redness, tenderness, or signs of infection (pain, swelling, redness, odor or green/yellow discharge around incision site)    Complete by:  As directed      Call MD for:  severe uncontrolled pain    Complete by:  As directed      Call MD for:  temperature >100.4    Complete by:  As directed      Diet - low sodium heart healthy    Complete by:  As directed      Discharge instructions    Complete by:  As directed   You were hospitalized with possible skin infection of your right arm.  You were given IV antibiotics for your skin  infection and you completed your course of antibiotics for your urinary tract infection.  Please keep a close eye on your arm and if you notice any signs of worsening infection, please talk to your primary care doctor.  You have been on several antibiotics which increase your risk of infectious diarrhea.  If you develop watery diarrhea any time in the next three months, please seek immediate medical attention to be tested for infectious diarrhea which needs to be treated with special antibiotics.     Increase activity slowly    Complete by:  As directed             Medication List    STOP taking these medications        imipenem-cilastatin 500 mg in sodium chloride 0.9 % 100 mL      TAKE these medications        albuterol 108 (90 BASE) MCG/ACT inhaler  Commonly known as:  PROVENTIL HFA;VENTOLIN HFA  Inhale 1 puff into the lungs every 6 (six) hours as needed for wheezing or shortness of breath.     aspirin 325 MG tablet  Take 325 mg by mouth daily as needed for mild pain.     clotrimazole-betamethasone cream  Commonly known as:  LOTRISONE  Apply 1 application topically 2 (two) times daily.     cyanocobalamin 1000 MCG/ML injection  Commonly known as:  (VITAMIN B-12)  Inject subcutaneously q Daily till 02/27/15,then, Inject subcutaneously q weekly for 4 weeks from 02/28/15, Then from 03/29/15-inject subcutaneously q monthly and stay on it     docusate sodium 100 MG capsule  Commonly known as:  COLACE  Take 1 capsule (100 mg total) by mouth every 12 (twelve) hours.     folic acid 1 MG tablet  Commonly known as:  FOLVITE  Take 1 tablet (1 mg total) by mouth daily.     lurasidone 40 MG Tabs tablet  Commonly known as:  LATUDA  Take 40 mg by mouth daily with breakfast.     ondansetron 4 MG tablet  Commonly known as:  ZOFRAN  Take 1 tablet (4 mg total) by mouth every 6 (six) hours.     PARoxetine 25 MG 24 hr tablet  Commonly known as:  PAXIL-CR  Take 1 tablet (25 mg total) by  mouth daily.     polyethylene glycol packet  Commonly known as:  MIRALAX / GLYCOLAX  Take 17 g by mouth daily as needed for mild constipation.     saccharomyces boulardii 250 MG capsule  Commonly known as:  FLORASTOR  Take 1 capsule (250  mg total) by mouth 2 (two) times daily.     vitamin B-1 250 MG tablet  Take 250 mg by mouth daily.       Follow-up Information    Follow up with [name], FNP. Schedule an appointment as soon as possible for a visit in 5 days.   Specialty:  Nurse Practitioner   Contact information:   [address] [place] [address] [phone]        The results of significant diagnostics from this hospitalization (including imaging, microbiology, ancillary and laboratory) are listed below for reference.    Significant Diagnostic Studies: Dg Chest 2 View  04/21/2015   CLINICAL DATA:  49 year old female with nausea and vomiting and diarrhea.  EXAM: CHEST  2 VIEW  COMPARISON:  Radiograph dated 04/16/2015  FINDINGS: The heart size and mediastinal contours are within normal limits. Both lungs are clear. The visualized skeletal structures are unremarkable.  IMPRESSION: No focal consolidation.   No interval change.   Electronically Signed   By: Anner Crete M.D.   On: 04/21/2015 22:55   Dg Abd Acute W/chest  04/30/2015   CLINICAL DATA:  49 year old female with lower abdominal pain  EXAM: DG ABDOMEN ACUTE W/ 1V CHEST  COMPARISON:  Chest radiograph dated 04/21/2015  FINDINGS: There is no evidence of dilated bowel loops or free intraperitoneal air. No radiopaque calculi or other significant radiographic abnormality is seen. Heart size and mediastinal contours are within normal limits. Both lungs are clear. Cholecystectomy clips. Moderate stool throughout the colon.  IMPRESSION: Negative abdominal radiographs.  No acute cardiopulmonary disease.   Electronically Signed   By: Anner Crete M.D.   On: 04/30/2015 22:59   Dg Abd Portable  1v  05/11/2015   CLINICAL DATA:  Lower abdominal pain for 2 days. Loose stools. Current UTI.  EXAM: PORTABLE ABDOMEN - 1 VIEW  COMPARISON:  04/30/2015  FINDINGS: Normal bowel gas pattern. Status postcholecystectomy. No evidence of renal or ureteral stones. Soft tissues are otherwise unremarkable.  No significant bony abnormality.  IMPRESSION: 1. No acute findings. No evidence of bowel obstruction or generalized adynamic ileus. No change from prior study.   Electronically Signed   By: Lajean Manes M.D.   On: 05/11/2015 15:06   US Abdomen Limited Ruq  04/22/2015   CLINICAL DATA:  Chest pain, nausea, vomiting, elevated liver function tests. Onset 5 days ago.  EXAM: US ABDOMEN LIMITED - RIGHT UPPER QUADRANT  COMPARISON:  None.  FINDINGS: Gallbladder:  Prior cholecystectomy  Common bile duct:  Diameter: 4 mm, normal  Liver:  There is generalized echogenicity of the liver without focal lesion. This may represent fatty infiltration.  IMPRESSION: Increased echogenicity of the liver suggesting fatty infiltration. No focal liver lesions. No acute biliary abnormalities are evident.   Electronically Signed   By: Andreas Newport M.D.   On: 04/22/2015 00:33    Microbiology: Recent Results (from the past 240 hour(s))  Urine culture     Status: None   Collection Time: 05/10/15 10:52 PM  Result Value Ref Range Status   Specimen Description URINE, RANDOM  Final   Special Requests NONE  Final   Culture   Final    >=100,000 COLONIES/mL ESCHERICHIA COLI Confirmed Extended Spectrum Beta-Lactamase Producer (ESBL) Performed at [facility]    Report Status 05/14/2015 FINAL  Final   Organism ID, Bacteria ESCHERICHIA COLI  Final      Susceptibility   Escherichia coli - MIC*    AMPICILLIN >=  32 RESISTANT Resistant     CEFAZOLIN >=64 RESISTANT Resistant     CEFTRIAXONE 8 RESISTANT Resistant     CIPROFLOXACIN >=4 RESISTANT Resistant     GENTAMICIN <=1 SENSITIVE Sensitive     IMIPENEM <=0.25 SENSITIVE  Sensitive     NITROFURANTOIN <=16 SENSITIVE Sensitive     TRIMETH/SULFA >=320 RESISTANT Resistant     AMPICILLIN/SULBACTAM >=32 RESISTANT Resistant     PIP/TAZO 8 SENSITIVE Sensitive     * >=100,000 COLONIES/mL ESCHERICHIA COLI  Blood culture (routine x 2)     Status: None   Collection Time: 05/11/15 12:12 AM  Result Value Ref Range Status   Specimen Description BLOOD RIGHT WRIST  Final   Special Requests BOTTLES DRAWN AEROBIC AND ANAEROBIC 2CC  Final   Culture   Final    NO GROWTH 5 DAYS Performed at [facility]    Report Status 05/16/2015 FINAL  Final  Blood culture (routine x 2)     Status: None   Collection Time: 05/11/15 12:21 AM  Result Value Ref Range Status   Specimen Description BLOOD LEFT HAND  Final   Special Requests BOTTLES DRAWN AEROBIC AND ANAEROBIC 5ML  Final   Culture   Final    NO GROWTH 5 DAYS Performed at [facility]    Report Status 05/16/2015 FINAL  Final  Cath Tip Culture     Status: None (Preliminary result)   Collection Time: 05/17/15 10:30 PM  Result Value Ref Range Status   Specimen Description CATH TIP RIGHT ARM PICCLINE  Final   Special Requests Normal  Final   Culture   Final    NO GROWTH 1 DAY Performed at [facility]    Report Status PENDING  Incomplete  Culture, blood (single)     Status: None (Preliminary result)   Collection Time: 05/17/15 10:31 PM  Result Value Ref Range Status   Specimen Description BLOOD RIGHT UPPERARM  Final   Special Requests BOTTLES DRAWN AEROBIC AND ANAEROBIC 10CC  Final   Culture   Final    NO GROWTH 1 DAY Performed at [facility]    Report Status PENDING  Incomplete  MRSA PCR Screening     Status: None   Collection Time: 05/18/15  1:44 AM  Result Value Ref Range Status   MRSA by PCR NEGATIVE NEGATIVE Final    Comment:        The GeneXpert MRSA Assay (FDA approved for NASAL specimens only), is one component of a comprehensive MRSA colonization surveillance program. It is  not intended to diagnose MRSA infection nor to guide or monitor treatment for MRSA infections.      Labs: Basic Metabolic Panel:  Recent Labs Lab 05/17/15 2232 05/18/15 0623  NA 140 143  K 3.7 4.0  CL 105 108  CO2 26 27  GLUCOSE 92 94  BUN 8 8  CREATININE 0.57 0.59  CALCIUM 9.5 9.1   Liver Function Tests: No results for input(s): AST, ALT, ALKPHOS, BILITOT, PROT, ALBUMIN in the last 168 hours. No results for input(s): LIPASE, AMYLASE in the last 168 hours. No results for input(s): AMMONIA in the last 168 hours. CBC:  Recent Labs Lab 05/17/15 2232 05/18/15 0623  WBC 6.0 5.5  NEUTROABS 3.7  --   HGB 15.0 13.8  HCT 44.4 41.6  MCV 85.7 86.3  PLT 166 150   Cardiac Enzymes: No results for input(s): CKTOTAL, CKMB, CKMBINDEX, TROPONINI in the last 168 hours. BNP: BNP (last 3 results)  Recent Labs  03/03/15 0028  BNP 31.7    ProBNP (last 3 results) No results for input(s): PROBNP in the last 8760 hours.  CBG: No results for input(s): GLUCAP in the last 168 hours.  Time coordinating discharge: 35 minutes  Signed:  Shareese Macha  [facility] 05/19/2015, 3:59 PM

## 2015-05-19 NOTE — Progress Notes (Signed)
Clinical Social Work  CSW received referral to offer cab voucher. CSW met with patient who had friend at bedside. Patient reports friend has money for cab to take them home. CSW left cab voucher with RN if needed.  Jordan [name], Jordan [name] [phone]

## 2015-05-20 LAB — CATH TIP CULTURE
Culture: NO GROWTH
Special Requests: NORMAL

## 2015-05-23 LAB — CULTURE, BLOOD (SINGLE): Culture: NO GROWTH

## 2015-06-11 ENCOUNTER — Emergency Department (HOSPITAL_COMMUNITY)
Admission: EM | Admit: 2015-06-11 | Discharge: 2015-06-11 | Disposition: A | Discharge status: Other Acute Inpatient Hospital | Payer: Medicaid Other

## 2015-06-11 NOTE — ED Notes (Signed)
Patient expressed desire not to be seen and requested to be placed in pickup circle to await a ride home

## 2015-06-11 NOTE — ED Notes (Signed)
Patient was called to be triage. Patient informed staff that she no longer wish to be seen. I informed her that she was welcome to come back at any time.

## 2015-06-14 ENCOUNTER — Emergency Department (HOSPITAL_COMMUNITY)
Admission: EM | Admit: 2015-06-14 | Discharge: 2015-06-14 | Disposition: A | Discharge status: Home / Self Care | Payer: Medicaid Other | Attending: Emergency Medicine | Admitting: Emergency Medicine

## 2015-06-14 ENCOUNTER — Encounter (HOSPITAL_COMMUNITY): Payer: Self-pay

## 2015-06-14 DIAGNOSIS — Z7982 Long term (current) use of aspirin: Secondary | ICD-10-CM | POA: Diagnosis not present

## 2015-06-14 DIAGNOSIS — D649 Anemia, unspecified: Secondary | ICD-10-CM | POA: Insufficient documentation

## 2015-06-14 DIAGNOSIS — Z7952 Long term (current) use of systemic steroids: Secondary | ICD-10-CM | POA: Diagnosis not present

## 2015-06-14 DIAGNOSIS — F319 Bipolar disorder, unspecified: Secondary | ICD-10-CM | POA: Diagnosis not present

## 2015-06-14 DIAGNOSIS — Z8619 Personal history of other infectious and parasitic diseases: Secondary | ICD-10-CM | POA: Insufficient documentation

## 2015-06-14 DIAGNOSIS — Z79899 Other long term (current) drug therapy: Secondary | ICD-10-CM | POA: Insufficient documentation

## 2015-06-14 DIAGNOSIS — N390 Urinary tract infection, site not specified: Secondary | ICD-10-CM | POA: Diagnosis not present

## 2015-06-14 DIAGNOSIS — E669 Obesity, unspecified: Secondary | ICD-10-CM | POA: Insufficient documentation

## 2015-06-14 DIAGNOSIS — M545 Low back pain: Secondary | ICD-10-CM | POA: Diagnosis present

## 2015-06-14 LAB — URINALYSIS, ROUTINE W REFLEX MICROSCOPIC
Glucose, UA: NEGATIVE mg/dL
Hgb urine dipstick: NEGATIVE
Ketones, ur: NEGATIVE mg/dL
Nitrite: POSITIVE — AB
Protein, ur: 30 mg/dL — AB
Specific Gravity, Urine: 1.037 — ABNORMAL HIGH (ref 1.005–1.030)
Urobilinogen, UA: 1 mg/dL (ref 0.0–1.0)
pH: 5.5 (ref 5.0–8.0)

## 2015-06-14 LAB — URINE MICROSCOPIC-ADD ON

## 2015-06-14 MED ORDER — NITROFURANTOIN MACROCRYSTAL 100 MG PO CAPS
100.0000 mg | ORAL_CAPSULE | Freq: Four times a day (QID) | ORAL | Status: DC
  Administered 2015-06-14: 100 mg via ORAL
  Filled 2015-06-14 (×2): qty 1

## 2015-06-14 MED ORDER — NITROFURANTOIN MACROCRYSTAL 100 MG PO CAPS: 100.0000 mg | ORAL_CAPSULE | Freq: Four times a day (QID) | ORAL | Status: DC

## 2015-06-14 MED ORDER — PHENAZOPYRIDINE HCL 200 MG PO TABS: 200.0000 mg | ORAL_TABLET | Freq: Three times a day (TID) | ORAL | Status: DC

## 2015-06-14 MED ORDER — CEFTRIAXONE SODIUM 1 G IJ SOLR: 1.0000 g | INTRAMUSCULAR | Status: DC

## 2015-06-14 MED ORDER — PHENAZOPYRIDINE HCL 200 MG PO TABS
200.0000 mg | ORAL_TABLET | Freq: Three times a day (TID) | ORAL | Status: DC
  Administered 2015-06-14: 200 mg via ORAL
  Filled 2015-06-14: qty 1

## 2015-06-14 MED ORDER — NITROFURANTOIN MACROCRYSTAL 100 MG PO CAPS
100.0000 mg | ORAL_CAPSULE | Freq: Two times a day (BID) | ORAL | Status: DC
Start: 2015-06-15 — End: 2015-12-28

## 2015-06-14 NOTE — ED Notes (Signed)
Patient is resting comfortably. 

## 2015-06-14 NOTE — ED Provider Notes (Signed)
CSN: [id number]     Arrival date & time 06/14/15  1933 History  This chart was scribed for Junius Creamer, NP, working with Virgel Manifold, MD by Steva Colder, ED Scribe. The patient was seen in room WTR9/WTR9 at [place].    Chief Complaint  Patient presents with  . Back Pain    The history is provided by the patient. No language interpreter was used.    HPI Comments: Geraline Halberstadt is a 49 y.o. female with a PMHx of UTI, who presents to the Emergency Department complaining of mid-low back pain onset 2.5 weeks. Pt notes that she informed her PCP about her symptoms 2 weeks ago when the symptoms started and had nothing completed for it. Pt was laying and eating when the pain occurred. Pt was informed that she should come in tomorrow to try and be seen by her PCP for her symptoms. Pt has had back pain in the past that was caused by a UTI that was in her blood stream. Pt was admitted to the hospital for her symptoms and had her pic line taken out last month. Pt does not walk due to there being pain in her legs and she takes OTC pain medications for her legs. She states that she is having associated symptoms of mild hematuria, and nocturia x 3 episodes nightly. She states that she has tried ASA and excedrin, tylenol with no relief for her symptoms. Pt denies fever, chills, n/v/d, dysuria, frequency, and any other symptoms. Pt notes that she no longer has periods due to her going through menopause.    Past Medical History  Diagnosis Date  . Mental disorder   . Bipolar 1 disorder   . Bipolar 1 disorder   . Yeast infection of the vagina   . Bipolar disorder   . Obesity   . UTI (lower urinary tract infection)   . Alcohol abuse   . Shortness of breath dyspnea   . Anemia   . 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 Cholecystectomy     History reviewed. No pertinent family history. Social History  Substance Use Topics  .  Smoking status: Never Smoker   . Smokeless tobacco: Never Used  . Alcohol Use: No   OB History    Gravida Para Term Preterm AB TAB SAB Ectopic Multiple Living   1 1 1       1      Review of Systems  Constitutional: Negative for fever and chills.  Gastrointestinal: Negative for nausea, vomiting and diarrhea.  Genitourinary: Positive for hematuria. Negative for dysuria and frequency.       Nocturia  Musculoskeletal: Positive for back pain.      Allergies  Tramadol; Sulfa antibiotics; Citalopram; and Lamotrigine  Home Medications   Prior to Admission medications   Medication Sig Start Date End Date Taking? Authorizing Provider  albuterol (PROVENTIL HFA;VENTOLIN HFA) 108 (90 BASE) MCG/ACT inhaler Inhale 1 puff into the lungs every 6 (six) hours as needed for wheezing or shortness of breath.    Historical Provider, MD  aspirin 325 MG tablet Take 325 mg by mouth daily as needed for mild pain.    Historical Provider, MD  clotrimazole-betamethasone (LOTRISONE) cream Apply 1 application topically 2 (two) times daily. 04/19/15   Historical Provider, MD  cyanocobalamin (,VITAMIN B-12,) 1000 MCG/ML injection Inject subcutaneously q Daily till 02/27/15,then, Inject subcutaneously q weekly for 4 weeks from 02/28/15, Then  from 03/29/15-inject subcutaneously q monthly and stay on it Patient taking differently: Inject 1,000 mcg into the skin every 30 (thirty) days. Next injection due 04/28/15 02/23/15   Jonetta Osgood, MD  docusate sodium (COLACE) 100 MG capsule Take 1 capsule (100 mg total) by mouth every 12 (twelve) hours. Patient not taking: Reported on 05/17/2015 05/12/15   Donne Hazel, MD  folic acid (FOLVITE) 1 MG tablet Take 1 tablet (1 mg total) by mouth daily. 02/23/15   Shanker Kristeen Mans, MD  lurasidone (LATUDA) 40 MG TABS tablet Take 40 mg by mouth daily with breakfast.    Historical Provider, MD  nitrofurantoin (MACRODANTIN) 100 MG capsule Take 1 capsule (100 mg total) by mouth 2 (two) times  daily. 06/15/15   Junius Creamer, NP  ondansetron (ZOFRAN) 4 MG tablet Take 1 tablet (4 mg total) by mouth every 6 (six) hours. 04/22/15   Nicole Pisciotta, PA-C  PARoxetine (PAXIL-CR) 25 MG 24 hr tablet Take 1 tablet (25 mg total) by mouth daily. Patient not taking: Reported on 04/21/2015 02/23/15   Jonetta Osgood, MD  phenazopyridine (PYRIDIUM) 200 MG tablet Take 1 tablet (200 mg total) by mouth 3 (three) times daily with meals. 06/15/15   Junius Creamer, NP  polyethylene glycol (MIRALAX / GLYCOLAX) packet Take 17 g by mouth daily as needed for mild constipation. Patient not taking: Reported on 05/17/2015 05/12/15   Donne Hazel, MD  saccharomyces boulardii (FLORASTOR) 250 MG capsule Take 1 capsule (250 mg total) by mouth 2 (two) times daily. 05/19/15   Janece Canterbury, MD  Thiamine HCl (VITAMIN B-1) 250 MG tablet Take 250 mg by mouth daily.     Historical Provider, MD   BP 118/72 mmHg  Pulse 91  Temp(Src) 97.9 F (36.6 C) (Oral)  Resp 18  SpO2 99%  LMP 09/13/2014 Physical Exam  Constitutional: She is oriented to person, place, and time. She appears well-developed and well-nourished. No distress.  HENT:  Head: Normocephalic and atraumatic.  Eyes: EOM are normal.  Neck: Neck supple. No tracheal deviation present.  Cardiovascular: Normal rate.   Pulmonary/Chest: Effort normal. No respiratory distress.  Genitourinary:  Urinary frequency.  Musculoskeletal: Normal range of motion.  Midline low back pain for 2 weeks.  Neurological: She is alert and oriented to person, place, and time.  Skin: Skin is warm and dry.  Psychiatric: She has a normal mood and affect. Her behavior is normal.  Nursing note and vitals reviewed.   ED Course  Procedures (including critical care time) DIAGNOSTIC STUDIES: Oxygen Saturation is 99% on RA, nl by my interpretation.    COORDINATION OF CARE: 8:18 PM Discussed treatment plan with pt at bedside and pt agreed to plan.   Labs Review Labs Reviewed  URINALYSIS,  ROUTINE W REFLEX MICROSCOPIC (NOT AT [facility]) - Abnormal; Notable for the following:    Color, Urine ORANGE (*)    APPearance CLOUDY (*)    Specific Gravity, Urine 1.037 (*)    Bilirubin Urine SMALL (*)    Protein, ur 30 (*)    Nitrite POSITIVE (*)    Leukocytes, UA SMALL (*)    All other components within normal limits  URINE MICROSCOPIC-ADD ON - Abnormal; Notable for the following:    Squamous Epithelial / LPF MANY (*)    Bacteria, UA MANY (*)    All other components within normal limits    Imaging Review No results found. I have personally reviewed and evaluated these images and lab results as part  of my medical decision-making.   EKG Interpretation None     Agents urine shows that she does have a urinary tract infection.  I checked her last urine culture from July 20th 2016 and she is resistant to many antibiotic-coated is sensitive to [employer], Macrodantin, which he has been placed on for 2 weeks as well as Pyridium for comfort and follow-up with her primary care physician in 2 weeks MDM   Final diagnoses:  UTI (lower urinary tract infection)      I personally performed the services described in this documentation, which was scribed in my presence. The recorded information has been reviewed and is accurate.   Junius Creamer, NP 06/14/15 [id number]  Virgel Manifold, MD 06/15/15 [phone]

## 2015-06-14 NOTE — ED Notes (Signed)
Pt complains of lower back pain for two weeks, her boyfriend tells RN that she can't get up to urinate but she's using the bedpan and it's really dark

## 2015-06-14 NOTE — Discharge Instructions (Signed)
Your urine shows that you have a urinary tract infection.  You havebeen started on Antivert by attic that should cover it.  I checked her last culture from July for test of cure in approximately 10 days

## 2015-06-19 ENCOUNTER — Encounter (HOSPITAL_COMMUNITY): Payer: Self-pay | Admitting: Emergency Medicine

## 2015-06-19 ENCOUNTER — Emergency Department (HOSPITAL_COMMUNITY): Payer: Medicaid Other

## 2015-06-19 ENCOUNTER — Emergency Department (HOSPITAL_COMMUNITY)
Admission: EM | Admit: 2015-06-19 | Discharge: 2015-06-19 | Disposition: A | Discharge status: Home / Self Care | Payer: Medicaid Other | Attending: Emergency Medicine | Admitting: Emergency Medicine

## 2015-06-19 DIAGNOSIS — Y998 Other external cause status: Secondary | ICD-10-CM | POA: Diagnosis not present

## 2015-06-19 DIAGNOSIS — W1839XA Other fall on same level, initial encounter: Secondary | ICD-10-CM | POA: Insufficient documentation

## 2015-06-19 DIAGNOSIS — F319 Bipolar disorder, unspecified: Secondary | ICD-10-CM | POA: Insufficient documentation

## 2015-06-19 DIAGNOSIS — Z8619 Personal history of other infectious and parasitic diseases: Secondary | ICD-10-CM | POA: Insufficient documentation

## 2015-06-19 DIAGNOSIS — D649 Anemia, unspecified: Secondary | ICD-10-CM | POA: Diagnosis not present

## 2015-06-19 DIAGNOSIS — Z79899 Other long term (current) drug therapy: Secondary | ICD-10-CM | POA: Insufficient documentation

## 2015-06-19 DIAGNOSIS — S99921A Unspecified injury of right foot, initial encounter: Secondary | ICD-10-CM | POA: Diagnosis present

## 2015-06-19 DIAGNOSIS — Z7952 Long term (current) use of systemic steroids: Secondary | ICD-10-CM | POA: Insufficient documentation

## 2015-06-19 DIAGNOSIS — Z7982 Long term (current) use of aspirin: Secondary | ICD-10-CM | POA: Diagnosis not present

## 2015-06-19 DIAGNOSIS — Y9301 Activity, walking, marching and hiking: Secondary | ICD-10-CM | POA: Insufficient documentation

## 2015-06-19 DIAGNOSIS — Z87440 Personal history of urinary (tract) infections: Secondary | ICD-10-CM | POA: Diagnosis not present

## 2015-06-19 DIAGNOSIS — E669 Obesity, unspecified: Secondary | ICD-10-CM | POA: Diagnosis not present

## 2015-06-19 DIAGNOSIS — Y92002 Bathroom of unspecified non-institutional (private) residence single-family (private) house as the place of occurrence of the external cause: Secondary | ICD-10-CM | POA: Diagnosis not present

## 2015-06-19 MED ORDER — ACETAMINOPHEN 325 MG PO TABS
650.0000 mg | ORAL_TABLET | Freq: Once | ORAL | Status: AC
  Administered 2015-06-19: 650 mg via ORAL
  Filled 2015-06-19: qty 2

## 2015-06-19 NOTE — ED Notes (Signed)
Patient arrived via EMS from [facility]hotel)  Patient fell off of toilet around 11 am today.  She denies hitting head or LOC.  Patient states she has pain in upper pinky toe of right foot.  Patient describes the pain as sharp and stabbing.  Pain scale is 10.  Pain is non-radiating.  Patient complains of no back pain. No deformity noted in right foot.

## 2015-06-19 NOTE — ED Notes (Signed)
Patient transported to X-ray 

## 2015-06-19 NOTE — Discharge Instructions (Signed)
-   Ice foot and use tylenol or ibuprofen for pain control - Wear walker boot when putting weight on foot. You may take it off to shower/bathe.  - Use wheelchair as needed for getting around your home tonight - Pick up walker from medical supply store tomorrow - Call Dr. Tamera Punt at [facility] to schedule an appointment - Return to ED with increasing severity of foot pain, increased swelling or increased redness.

## 2015-06-19 NOTE — ED Notes (Signed)
AVS explained in detail. Knows to follow up with orthopedics ASAP. Knows to pick up walker from supply store tomorrow/take ibuprofen and tylenol. Given a bedpan for home use. No other c/c.

## 2015-06-19 NOTE — ED Provider Notes (Signed)
CSN: [id number]     Arrival date & time 06/19/15  1626 History   First MD Initiated Contact with Patient 06/19/15 1702     Chief Complaint  Patient presents with  . Toe Pain  . Fall    HPI  Jordan [name] is a 49 year old female with PMHx of bipolar and obesity presenting with right 5th toe injury. Pt was walking to the bathroom when she missed the step up into the room and fell. Endorses severe generalized right foot pain and swelling. At baseline, pt is largely non-ambulatory. States she uses a wheelchair to get around or her boyfriend has to help support her as she walks. States that she is starting physical therapy soon to regain mobility and has a walker ordered that she picks up tomorrow. Denies hitting head or LOC. Denies other injuries sustained in the fall. Denies headaches, chest pain, SOB, abdominal pain, nausea or diarrhea.   Past Medical History  Diagnosis Date  . Mental disorder   . Bipolar 1 disorder   . Bipolar 1 disorder   . Yeast infection of the vagina   . Bipolar disorder   . Obesity   . UTI (lower urinary tract infection)   . Alcohol abuse   . Shortness of breath dyspnea   . Anemia   . 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 Cholecystectomy     No family history on file. Social History  Substance Use Topics  . Smoking status: Never Smoker   . Smokeless tobacco: Never Used  . Alcohol Use: No   OB History    Gravida Para Term Preterm AB TAB SAB Ectopic Multiple Living   1 1 1       1      Review of Systems  Constitutional: Negative for fever.  Respiratory: Negative for shortness of breath.   Cardiovascular: Negative for chest pain.  Gastrointestinal: Negative for nausea, vomiting and abdominal pain.  Musculoskeletal: Positive for joint swelling, arthralgias and gait problem.  Skin: Negative for wound.  Neurological: Negative for headaches.      Allergies  Tramadol; Sulfa  antibiotics; Citalopram; and Lamotrigine  Home Medications   Prior to Admission medications   Medication Sig Start Date End Date Taking? Authorizing Provider  albuterol (PROVENTIL HFA;VENTOLIN HFA) 108 (90 BASE) MCG/ACT inhaler Inhale 1 puff into the lungs every 6 (six) hours as needed for wheezing or shortness of breath.    Historical Provider, MD  aspirin 325 MG tablet Take 325 mg by mouth daily as needed for mild pain.    Historical Provider, MD  clotrimazole-betamethasone (LOTRISONE) cream Apply 1 application topically 2 (two) times daily. 04/19/15   Historical Provider, MD  cyanocobalamin (,VITAMIN B-12,) 1000 MCG/ML injection Inject subcutaneously q Daily till 02/27/15,then, Inject subcutaneously q weekly for 4 weeks from 02/28/15, Then from 03/29/15-inject subcutaneously q monthly and stay on it Patient taking differently: Inject 1,000 mcg into the skin every 30 (thirty) days. Next injection due 04/28/15 02/23/15   Jonetta Osgood, MD  docusate sodium (COLACE) 100 MG capsule Take 1 capsule (100 mg total) by mouth every 12 (twelve) hours. Patient not taking: Reported on 05/17/2015 05/12/15   Donne Hazel, MD  folic acid (FOLVITE) 1 MG tablet Take 1 tablet (1 mg total) by mouth daily. 02/23/15   Shanker Kristeen Mans, MD  lurasidone (LATUDA) 40 MG TABS tablet Take 40 mg by mouth daily with breakfast.  Historical Provider, MD  nitrofurantoin (MACRODANTIN) 100 MG capsule Take 1 capsule (100 mg total) by mouth 2 (two) times daily. 06/15/15   Junius Creamer, NP  ondansetron (ZOFRAN) 4 MG tablet Take 1 tablet (4 mg total) by mouth every 6 (six) hours. 04/22/15   Nicole Pisciotta, PA-C  PARoxetine (PAXIL-CR) 25 MG 24 hr tablet Take 1 tablet (25 mg total) by mouth daily. Patient not taking: Reported on 04/21/2015 02/23/15   Jonetta Osgood, MD  phenazopyridine (PYRIDIUM) 200 MG tablet Take 1 tablet (200 mg total) by mouth 3 (three) times daily with meals. 06/15/15   Junius Creamer, NP  polyethylene glycol (MIRALAX /  GLYCOLAX) packet Take 17 g by mouth daily as needed for mild constipation. Patient not taking: Reported on 05/17/2015 05/12/15   Donne Hazel, MD  saccharomyces boulardii (FLORASTOR) 250 MG capsule Take 1 capsule (250 mg total) by mouth 2 (two) times daily. 05/19/15   Janece Canterbury, MD  Thiamine HCl (VITAMIN B-1) 250 MG tablet Take 250 mg by mouth daily.     Historical Provider, MD   BP 123/93 mmHg  Pulse 104  Temp(Src) 98.1 F (36.7 C) (Oral)  Resp 19  Ht 5\' 8"  (1.727 m)  Wt 308 lb (139.708 kg)  BMI 46.84 kg/m2  SpO2 97%  LMP 09/13/2014 Physical Exam  Constitutional: She is oriented to person, place, and time. She appears well-developed and well-nourished. No distress.  HENT:  Head: Normocephalic and atraumatic.  Neck: Normal range of motion.  Cardiovascular: Normal rate, regular rhythm and normal heart sounds.   Cap refill < 3 Pedal pulses palpable  Pulmonary/Chest: Effort normal and breath sounds normal. No respiratory distress. She has no wheezes.  Musculoskeletal:  Pt able to move toes. No visible deformity of foot. Generalized TTP of right foot. No point tenderness over metatarsal heads. Minimal swelling of right lateral foot.   Neurological: She is alert and oriented to person, place, and time.  Sensation intact over right foot and toes. Pt would not test strength of ankle due to complaints of pain  Skin: Skin is warm and dry.  Psychiatric: She has a normal mood and affect. Her behavior is normal.  Vitals reviewed.   ED Course  Procedures (including critical care time) Labs Review Labs Reviewed - No data to display  Imaging Review Dg Foot Complete Right  06/19/2015   CLINICAL DATA:  Status post fall, lateral foot pain  EXAM: RIGHT FOOT COMPLETE - 3+ VIEW  COMPARISON:  5th toe radiographs dated 11/07/2011  FINDINGS: Subtle cortical irregularity involving the 3rd through 5th metacarpal heads, worrisome for nondisplaced fracture.  The joint spaces are preserved.   Associated soft tissue swelling.  IMPRESSION: Suspected subtle nondisplaced fractures involving the 3rd through 5th metacarpal heads. Correlate for point tenderness.   Electronically Signed   By: Julian Hy M.D.   On: 06/19/2015 18:12   I have personally reviewed and evaluated these images and lab results as part of my medical decision-making.   EKG Interpretation None     5:30 PM - Pt noted to be tachycardic to 135 during interview. Given pain medicine and rechecked pulse, brought down to 104. Will recheck pulse again before discharge.   MDM   Final diagnoses:  Foot injury, right, initial encounter   Right foot injury Pt presenting after a fall with right foot pain. Pt localizes pain to right pinky toe. VSS. Nontoxic. On exam, generalized right foot tenderness from ankle to toes present. No point tenderness. Xray  shows suspected subtle nondisplaced fractures of 3rd-5th metatarsal heads. Will put pt in cam boot and have her follow up with [facility] in 1 week. Pt uses wheelchair at home for mobility and is picking up a walker tomorrow. Advised to keep boot on at all times unless bathing. Pt agrees. Return precautions discussed with pt at length and given in discharge paperwork.     Josephina Gip, PA-C 06/20/15 [place], MD 06/20/15 2230

## 2015-06-19 NOTE — Progress Notes (Addendum)
Patient noted to have seven admissions and seven ED visits within the last six months.  EDCM spoke to patient at bedside.  Patient confirms her pcp is located at the [facility].  Patient reports she is being seen by a counselor at the [facility] clinic for her psychiatric issues.  Patient reports she receives assistance with her psychiatric medications and her "regular" medications at the clinic.  Patient reports she is no longer being seen by [facility] for IV antibiotics.  Patient no longer has a PICC line.  Patient reports she is receiving an antibiotic for a UTI currently but cannot tell [facility] which one it is.  Patient reports she is to begin PT in outpatient setting on Wednesday.  Patient not able to tell [facility] where she is going to start physical therapy.  Patient reports she has the phone number for Medicaid transport and will be calling them for transportation to PT.  EDCM reviewed dme with patient.  Patient reports she has received the wheelchair, and the bedside commode, but she has not received a walker.  Patient also reports she needs a shower chair.  EDCM called [facility] and spoke to [place] at 1811pm who reports they are unable to locate the order for the walker, but see that a wheelchair, commode and bath seat were delivered to the patient.  Lilia Pro reports tub seat was delivered in June.  EDCM discussed with EDPA and placed order in for wide rolling walker.  [facility] provided patient with contact information for [facility] and wrote reminder to call her pcp to inform her of address and phone number of where she will be receiving PT.  Patient confirms she is staying at [facility] and gave phone number [phone] for contact.  Patient's boyfriend not at bedside but patient reports he is still a good support to her.  Patient thankful for services.  No further EDCM needs at this time.  06/19/2015 A. Adelita Hone RNCM [id number]  [facility] faxed order for rolling walker to [facility] with confirmation of receipt.

## 2015-06-21 NOTE — Progress Notes (Signed)
EDCM called patient at phone number [phone] for follow up regarding walker without success.  EDCM left vm with phone number for call back.  No further EDCM needs at this time.

## 2015-06-23 NOTE — Progress Notes (Signed)
EDCM called and spoke to [place] of [facility] who confirms that patient's walker was delivered and patient did receive it.

## 2015-06-26 ENCOUNTER — Emergency Department (HOSPITAL_COMMUNITY): Payer: Medicaid Other

## 2015-06-26 ENCOUNTER — Emergency Department (HOSPITAL_COMMUNITY)
Admission: EM | Admit: 2015-06-26 | Discharge: 2015-06-26 | Disposition: A | Discharge status: Home / Self Care | Payer: Medicaid Other | Attending: Emergency Medicine | Admitting: Emergency Medicine

## 2015-06-26 ENCOUNTER — Encounter (HOSPITAL_COMMUNITY): Payer: Self-pay | Admitting: *Deleted

## 2015-06-26 DIAGNOSIS — D649 Anemia, unspecified: Secondary | ICD-10-CM | POA: Diagnosis not present

## 2015-06-26 DIAGNOSIS — Z8619 Personal history of other infectious and parasitic diseases: Secondary | ICD-10-CM | POA: Insufficient documentation

## 2015-06-26 DIAGNOSIS — Z7982 Long term (current) use of aspirin: Secondary | ICD-10-CM | POA: Diagnosis not present

## 2015-06-26 DIAGNOSIS — Z79899 Other long term (current) drug therapy: Secondary | ICD-10-CM | POA: Diagnosis not present

## 2015-06-26 DIAGNOSIS — S92901D Unspecified fracture of right foot, subsequent encounter for fracture with routine healing: Secondary | ICD-10-CM

## 2015-06-26 DIAGNOSIS — Z87440 Personal history of urinary (tract) infections: Secondary | ICD-10-CM | POA: Diagnosis not present

## 2015-06-26 DIAGNOSIS — S92334D Nondisplaced fracture of third metatarsal bone, right foot, subsequent encounter for fracture with routine healing: Secondary | ICD-10-CM | POA: Diagnosis not present

## 2015-06-26 DIAGNOSIS — M25571 Pain in right ankle and joints of right foot: Secondary | ICD-10-CM | POA: Diagnosis present

## 2015-06-26 DIAGNOSIS — S92344D Nondisplaced fracture of fourth metatarsal bone, right foot, subsequent encounter for fracture with routine healing: Secondary | ICD-10-CM | POA: Diagnosis not present

## 2015-06-26 DIAGNOSIS — F319 Bipolar disorder, unspecified: Secondary | ICD-10-CM | POA: Insufficient documentation

## 2015-06-26 DIAGNOSIS — E669 Obesity, unspecified: Secondary | ICD-10-CM | POA: Insufficient documentation

## 2015-06-26 DIAGNOSIS — X58XXXD Exposure to other specified factors, subsequent encounter: Secondary | ICD-10-CM | POA: Insufficient documentation

## 2015-06-26 DIAGNOSIS — S92354D Nondisplaced fracture of fifth metatarsal bone, right foot, subsequent encounter for fracture with routine healing: Secondary | ICD-10-CM | POA: Insufficient documentation

## 2015-06-26 MED ORDER — HYDROCODONE-ACETAMINOPHEN 5-325 MG PO TABS: 1.0000 | ORAL_TABLET | Freq: Four times a day (QID) | ORAL | Status: DC | PRN

## 2015-06-26 MED ORDER — IBUPROFEN 200 MG PO TABS
600.0000 mg | ORAL_TABLET | Freq: Once | ORAL | Status: AC
  Administered 2015-06-26: 600 mg via ORAL
  Filled 2015-06-26: qty 3

## 2015-06-26 NOTE — ED Provider Notes (Signed)
CSN: [id number]     Arrival date & time 06/26/15  1601 History  This chart was scribed for Jeannett Senior, PA-C, working with Merrily Pew, MD by Starleen Arms, ED Scribe. This patient was seen in room WTR7/WTR7 and the patient's care was started at 5:24 PM.   Chief Complaint  Patient presents with  . Ankle Injury    right   The history is provided by the patient. No language interpreter was used.   HPI Comments: Jordan [name] is a 49 y.o. female who presents to the Emergency Department complaining of constant, unchanged right ankle pain and swelling after a twisting injury 4 days ago.  Patient was seen in the ED after the injury where imaging of the right ankle showed a "suspected subtle nondisplaced fractures of 3rd-5th metatarsal heads."  She was given a CAM Boot, which she has worn intermittently, and told to f/u with [facility] in 1 week.  She was seen by her PCP 3 days ago who suggested she elevate the foot, which she has done intermittently.  She has been bearing weight and ambulating without the CAM Boot.  She has used Tylenol, ibuprofen with minimal relief.    Past Medical History  Diagnosis Date  . Mental disorder   . Bipolar 1 disorder   . Bipolar 1 disorder   . Yeast infection of the vagina   . Bipolar disorder   . Obesity   . UTI (lower urinary tract infection)   . Alcohol abuse   . Shortness of breath dyspnea   . Anemia   . 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 Cholecystectomy     No family history on file. Social History  Substance Use Topics  . Smoking status: Never Smoker   . Smokeless tobacco: Never Used  . Alcohol Use: No   OB History    Gravida Para Term Preterm AB TAB SAB Ectopic Multiple Living   1 1 1       1      Review of Systems  Constitutional: Negative for fever.  Musculoskeletal: Positive for joint swelling and arthralgias.      Allergies  Tramadol;  Sulfa antibiotics; Citalopram; and Lamotrigine  Home Medications   Prior to Admission medications   Medication Sig Start Date End Date Taking? Authorizing Provider  albuterol (PROVENTIL HFA;VENTOLIN HFA) 108 (90 BASE) MCG/ACT inhaler Inhale 1 puff into the lungs every 6 (six) hours as needed for wheezing or shortness of breath.    Historical Provider, MD  aspirin 325 MG tablet Take 325 mg by mouth daily as needed for mild pain.    Historical Provider, MD  clotrimazole-betamethasone (LOTRISONE) cream Apply 1 application topically 2 (two) times daily. 04/19/15   Historical Provider, MD  cyanocobalamin (,VITAMIN B-12,) 1000 MCG/ML injection Inject subcutaneously q Daily till 02/27/15,then, Inject subcutaneously q weekly for 4 weeks from 02/28/15, Then from 03/29/15-inject subcutaneously q monthly and stay on it Patient taking differently: Inject 1,000 mcg into the skin every 30 (thirty) days. Next injection due 04/28/15 02/23/15   Jonetta Osgood, MD  docusate sodium (COLACE) 100 MG capsule Take 1 capsule (100 mg total) by mouth every 12 (twelve) hours. Patient not taking: Reported on 05/17/2015 05/12/15   Donne Hazel, MD  folic acid (FOLVITE) 1 MG tablet Take 1 tablet (1 mg total) by mouth daily. 02/23/15   Shanker Kristeen Mans, MD  lurasidone (LATUDA) 40 MG TABS tablet  Take 40 mg by mouth daily with breakfast.    Historical Provider, MD  nitrofurantoin (MACRODANTIN) 100 MG capsule Take 1 capsule (100 mg total) by mouth 2 (two) times daily. 06/15/15   Junius Creamer, NP  ondansetron (ZOFRAN) 4 MG tablet Take 1 tablet (4 mg total) by mouth every 6 (six) hours. 04/22/15   Nicole Pisciotta, PA-C  PARoxetine (PAXIL-CR) 25 MG 24 hr tablet Take 1 tablet (25 mg total) by mouth daily. Patient not taking: Reported on 04/21/2015 02/23/15   Jonetta Osgood, MD  phenazopyridine (PYRIDIUM) 200 MG tablet Take 1 tablet (200 mg total) by mouth 3 (three) times daily with meals. 06/15/15   Junius Creamer, NP  polyethylene glycol (MIRALAX  / GLYCOLAX) packet Take 17 g by mouth daily as needed for mild constipation. Patient not taking: Reported on 05/17/2015 05/12/15   Donne Hazel, MD  saccharomyces boulardii (FLORASTOR) 250 MG capsule Take 1 capsule (250 mg total) by mouth 2 (two) times daily. 05/19/15   Janece Canterbury, MD  Thiamine HCl (VITAMIN B-1) 250 MG tablet Take 250 mg by mouth daily.     Historical Provider, MD   BP 146/87 mmHg  Pulse 94  Temp(Src) 98.6 F (37 C) (Oral)  Resp 22  SpO2 98%  LMP 09/13/2014 Physical Exam  Constitutional: She is oriented to person, place, and time. She appears well-developed and well-nourished. No distress.  HENT:  Head: Normocephalic and atraumatic.  Eyes: Conjunctivae and EOM are normal.  Neck: Neck supple. No tracheal deviation present.  Cardiovascular: Normal rate.   Pulmonary/Chest: Effort normal. No respiratory distress.  Musculoskeletal: Normal range of motion.  Swelling noted to the dorsal right foot and over plantar surface of the foot. Tenderness to palpation over third, fourth, fifth metatarsals. Dorsal pedal pulse intact. Cap refill less than 2 seconds distally to the toes. Patient is able to wiggle her toes. Ankles unremarkable.  Neurological: She is alert and oriented to person, place, and time.  Skin: Skin is warm and dry.  Psychiatric: She has a normal mood and affect. Her behavior is normal.  Nursing note and vitals reviewed.   ED Course  Procedures (including critical care time)  DIAGNOSTIC STUDIES: Oxygen Saturation is 98% on RA, normal by my interpretation.    COORDINATION OF CARE:  5:32 PM Discussed treatment plan with patient at bedside.  Patient acknowledges and agrees with plan.    Labs Review Labs Reviewed - No data to display  Imaging Review No results found. I have personally reviewed and evaluated these images and lab results as part of my medical decision-making.   EKG Interpretation None      MDM   Final diagnoses:  Foot fracture,  right, with routine healing, subsequent encounter    Patient emergency department for persistent pain to the right foot, injury 5 days ago, x-ray that time showing possible nondisplaced fractures of the third, fourth, fifth metatarsals. Patient does not have her boot on the images provided, stating that they gave her one for wrong foot. Patient also is not using her crutches or walker, walking on the foot. I will re-x-ray her foot to make sure that there is no changes to the fracture.  X-rays show persistent nondisplaced fracture to the foot. I've instructed her to wear her boo tat all times. Nonweightbearing. Please follow with orthopedic specialist.  Filed Vitals:   06/26/15 1609 06/26/15 1855  BP: 146/87 129/86  Pulse: 94 91  Temp: 98.6 F (37 C)   TempSrc: Oral  Resp: 22 18  SpO2: 98% 96%    I personally performed the services described in this documentation, which was scribed in my presence. The recorded information has been reviewed and is accurate.   Jeannett Senior, PA-C 06/26/15 1944  Merrily Pew, MD 06/27/15 1414

## 2015-06-26 NOTE — ED Notes (Signed)
Pt will use a wheelchair for transport to car. She is at baseline, a&ox4. Questions concerns denied r/t dc. Pt made aware it is imperative she f/u with referral orthopedic

## 2015-06-26 NOTE — Discharge Instructions (Signed)
Your foot is broken. Please wear a boot at all times. Keep it elevated. Ice several times a day. Please use a walker, and no bearing weight on the foot. Please follow-up with a orthopedic specialist.  Metatarsal Fracture, Undisplaced A metatarsal fracture is a break in the bone(s) of the foot. These are the bones of the foot that connect your toes to the bones of the ankle. DIAGNOSIS  The diagnoses of these fractures are usually made with X-rays. If there are problems in the forefoot and x-rays are normal a later bone scan will usually make the diagnosis.  TREATMENT AND HOME CARE INSTRUCTIONS  Treatment may or may not include a cast or walking shoe. When casts are needed the use is usually for short periods of time so as not to slow down healing with muscle wasting (atrophy).  Activities should be stopped until further advised by your caregiver.  Wear shoes with adequate shock absorbing capabilities and stiff soles.  Alternative exercise may be undertaken while waiting for healing. These may include bicycling and swimming, or as your caregiver suggests.  It is important to keep all follow-up visits or specialty referrals. The failure to keep these appointments could result in improper bone healing and chronic pain or disability.  Warning: Do not drive a car or operate a motor vehicle until your caregiver specifically tells you it is safe to do so. IF YOU DO NOT HAVE A CAST OR SPLINT:  You may walk on your injured foot as tolerated or advised.  Do not put any weight on your injured foot for as long as directed by your caregiver. Slowly increase the amount of time you walk on the foot as the pain allows or as advised.  Use crutches until you can bear weight without pain. A gradual increase in weight bearing may help.  Apply ice to the injury for 15-20 minutes each hour while awake for the first 2 days. Put the ice in a plastic bag and place a towel between the bag of ice and your  skin.  Only take over-the-counter or prescription medicines for pain, discomfort, or fever as directed by your caregiver. SEEK IMMEDIATE MEDICAL CARE IF:   Your cast gets damaged or breaks.  You have continued severe pain or more swelling than you did before the cast was put on, or the pain is not controlled with medications.  Your skin or nails below the injury turn blue or grey, or feel cold or numb.  There is a bad smell, or new stains or pus-like (purulent) drainage coming from the cast. MAKE SURE YOU:   Understand these instructions.  Will watch your condition.  Will get help right away if you are not doing well or get worse. Document Released: 06/29/2002 Document Revised: 12/30/2011 Document Reviewed: 05/20/2008 [facility] Patient Information 2015 [place], [place]. This information is not intended to replace advice given to you by your health care provider. Make sure you discuss any questions you have with your health care provider.

## 2015-06-26 NOTE — ED Notes (Signed)
PA at bedside.

## 2015-06-26 NOTE — ED Notes (Addendum)
Pt reports injuring right ankle on this past Thursday. Pt seen in ED at that time, xrays completed, "xray didn't show anything". Today lateral right ankle edema, pt reports pain at site . She is unable to put weight on affected extremity.Pt is a&ox4, SBP 130 palpated, 18, 98%ra

## 2015-07-01 ENCOUNTER — Inpatient Hospital Stay (HOSPITAL_COMMUNITY)
Admission: EM | Admit: 2015-07-01 | Discharge: 2015-07-02 | DRG: 690 | Discharge status: Against Medical Advice | Payer: Medicaid Other | Attending: Family Medicine | Admitting: Family Medicine

## 2015-07-01 ENCOUNTER — Encounter (HOSPITAL_COMMUNITY): Payer: Self-pay | Admitting: Emergency Medicine

## 2015-07-01 ENCOUNTER — Inpatient Hospital Stay (HOSPITAL_COMMUNITY): Payer: Medicaid Other

## 2015-07-01 DIAGNOSIS — F310 Bipolar disorder, current episode hypomanic: Secondary | ICD-10-CM

## 2015-07-01 DIAGNOSIS — F319 Bipolar disorder, unspecified: Secondary | ICD-10-CM | POA: Diagnosis present

## 2015-07-01 DIAGNOSIS — Z885 Allergy status to narcotic agent status: Secondary | ICD-10-CM

## 2015-07-01 DIAGNOSIS — Z1612 Extended spectrum beta lactamase (ESBL) resistance: Secondary | ICD-10-CM

## 2015-07-01 DIAGNOSIS — Z888 Allergy status to other drugs, medicaments and biological substances status: Secondary | ICD-10-CM

## 2015-07-01 DIAGNOSIS — N390 Urinary tract infection, site not specified: Secondary | ICD-10-CM | POA: Diagnosis not present

## 2015-07-01 DIAGNOSIS — R42 Dizziness and giddiness: Secondary | ICD-10-CM

## 2015-07-01 DIAGNOSIS — R0600 Dyspnea, unspecified: Secondary | ICD-10-CM

## 2015-07-01 DIAGNOSIS — Z79899 Other long term (current) drug therapy: Secondary | ICD-10-CM

## 2015-07-01 DIAGNOSIS — Z87440 Personal history of urinary (tract) infections: Secondary | ICD-10-CM

## 2015-07-01 DIAGNOSIS — I519 Heart disease, unspecified: Secondary | ICD-10-CM | POA: Diagnosis not present

## 2015-07-01 DIAGNOSIS — D531 Other megaloblastic anemias, not elsewhere classified: Secondary | ICD-10-CM | POA: Diagnosis present

## 2015-07-01 DIAGNOSIS — Z79891 Long term (current) use of opiate analgesic: Secondary | ICD-10-CM | POA: Diagnosis not present

## 2015-07-01 DIAGNOSIS — W1811XA Fall from or off toilet without subsequent striking against object, initial encounter: Secondary | ICD-10-CM | POA: Diagnosis present

## 2015-07-01 DIAGNOSIS — A499 Bacterial infection, unspecified: Secondary | ICD-10-CM

## 2015-07-01 DIAGNOSIS — Y92002 Bathroom of unspecified non-institutional (private) residence single-family (private) house as the place of occurrence of the external cause: Secondary | ICD-10-CM

## 2015-07-01 DIAGNOSIS — I5189 Other ill-defined heart diseases: Secondary | ICD-10-CM

## 2015-07-01 DIAGNOSIS — Z882 Allergy status to sulfonamides status: Secondary | ICD-10-CM

## 2015-07-01 DIAGNOSIS — Z809 Family history of malignant neoplasm, unspecified: Secondary | ICD-10-CM

## 2015-07-01 DIAGNOSIS — E669 Obesity, unspecified: Secondary | ICD-10-CM | POA: Diagnosis present

## 2015-07-01 DIAGNOSIS — Z7982 Long term (current) use of aspirin: Secondary | ICD-10-CM | POA: Diagnosis not present

## 2015-07-01 DIAGNOSIS — I5030 Unspecified diastolic (congestive) heart failure: Secondary | ICD-10-CM | POA: Diagnosis present

## 2015-07-01 LAB — CBC
HCT: 39.2 % (ref 36.0–46.0)
Hemoglobin: 13.4 g/dL (ref 12.0–15.0)
MCH: 29.7 pg (ref 26.0–34.0)
MCHC: 34.2 g/dL (ref 30.0–36.0)
MCV: 86.9 fL (ref 78.0–100.0)
Platelets: 165 10*3/uL (ref 150–400)
RBC: 4.51 MIL/uL (ref 3.87–5.11)
RDW: 13.4 % (ref 11.5–15.5)
WBC: 6.8 10*3/uL (ref 4.0–10.5)

## 2015-07-01 LAB — URINE MICROSCOPIC-ADD ON

## 2015-07-01 LAB — URINALYSIS, ROUTINE W REFLEX MICROSCOPIC
Glucose, UA: NEGATIVE mg/dL
Ketones, ur: >80 mg/dL — AB
Nitrite: POSITIVE — AB
Protein, ur: 100 mg/dL — AB
Specific Gravity, Urine: 1.023 (ref 1.005–1.030)
Urobilinogen, UA: 1 mg/dL (ref 0.0–1.0)
pH: 6.5 (ref 5.0–8.0)

## 2015-07-01 LAB — I-STAT CHEM 8, ED
BUN: 11 mg/dL (ref 6–20)
Calcium, Ion: 1.21 mmol/L (ref 1.12–1.23)
Chloride: 103 mmol/L (ref 101–111)
Creatinine, Ser: 0.8 mg/dL (ref 0.44–1.00)
Glucose, Bld: 93 mg/dL (ref 65–99)
HCT: 45 % (ref 36.0–46.0)
Hemoglobin: 15.3 g/dL — ABNORMAL HIGH (ref 12.0–15.0)
Potassium: 3.9 mmol/L (ref 3.5–5.1)
Sodium: 141 mmol/L (ref 135–145)
TCO2: 24 mmol/L (ref 0–100)

## 2015-07-01 LAB — CREATININE, SERUM
Creatinine, Ser: 0.69 mg/dL (ref 0.44–1.00)
GFR calc Af Amer: >60 mL/min (ref >60)
GFR calc non Af Amer: >60 mL/min (ref >60)

## 2015-07-01 LAB — BRAIN NATRIURETIC PEPTIDE: B Natriuretic Peptide: 16 pg/mL (ref 0.0–100.0)

## 2015-07-01 MED ORDER — HYDROCODONE-ACETAMINOPHEN 5-325 MG PO TABS: 1.0000 | ORAL_TABLET | Freq: Four times a day (QID) | ORAL | Status: DC | PRN

## 2015-07-01 MED ORDER — ACETAMINOPHEN 650 MG RE SUPP: 650.0000 mg | Freq: Four times a day (QID) | RECTAL | Status: DC | PRN

## 2015-07-01 MED ORDER — CEPHALEXIN 500 MG PO CAPS
500.0000 mg | ORAL_CAPSULE | Freq: Once | ORAL | Status: AC
  Administered 2015-07-01: 500 mg via ORAL
  Filled 2015-07-01: qty 1

## 2015-07-01 MED ORDER — POLYETHYLENE GLYCOL 3350 17 G PO PACK: 17.0000 g | PACK | Freq: Every day | ORAL | Status: DC | PRN

## 2015-07-01 MED ORDER — ACETAMINOPHEN 325 MG PO TABS: 650.0000 mg | ORAL_TABLET | Freq: Four times a day (QID) | ORAL | Status: DC | PRN

## 2015-07-01 MED ORDER — SODIUM CHLORIDE 0.9 % IV BOLUS (SEPSIS): 1000.0000 mL | Freq: Once | INTRAVENOUS | Status: DC

## 2015-07-01 MED ORDER — FOLIC ACID 1 MG PO TABS
1.0000 mg | ORAL_TABLET | Freq: Every day | ORAL | Status: DC
  Administered 2015-07-02: 1 mg via ORAL
  Filled 2015-07-01: qty 1

## 2015-07-01 MED ORDER — ONDANSETRON HCL 4 MG PO TABS: 4.0000 mg | ORAL_TABLET | Freq: Four times a day (QID) | ORAL | Status: DC | PRN

## 2015-07-01 MED ORDER — ASPIRIN 325 MG PO TABS
325.0000 mg | ORAL_TABLET | Freq: Every day | ORAL | Status: DC | PRN
  Filled 2015-07-01: qty 1

## 2015-07-01 MED ORDER — VITAMIN B-1 50 MG PO TABS
250.0000 mg | ORAL_TABLET | Freq: Every day | ORAL | Status: DC
  Administered 2015-07-02: 250 mg via ORAL
  Filled 2015-07-01: qty 1

## 2015-07-01 MED ORDER — ONDANSETRON HCL 4 MG/2ML IJ SOLN: 4.0000 mg | Freq: Four times a day (QID) | INTRAMUSCULAR | Status: DC | PRN

## 2015-07-01 MED ORDER — PAROXETINE HCL ER 25 MG PO TB24
25.0000 mg | ORAL_TABLET | Freq: Every day | ORAL | Status: DC
  Administered 2015-07-02 (×2): 25 mg via ORAL
  Filled 2015-07-01 (×2): qty 1

## 2015-07-01 MED ORDER — DOCUSATE SODIUM 100 MG PO CAPS
100.0000 mg | ORAL_CAPSULE | Freq: Two times a day (BID) | ORAL | Status: DC
  Administered 2015-07-02: 100 mg via ORAL
  Filled 2015-07-01 (×2): qty 1

## 2015-07-01 MED ORDER — SODIUM CHLORIDE 0.9 % IV SOLN
500.0000 mg | Freq: Four times a day (QID) | INTRAVENOUS | Status: DC
  Administered 2015-07-02 (×3): 500 mg via INTRAVENOUS
  Filled 2015-07-01 (×5): qty 500

## 2015-07-01 MED ORDER — SODIUM CHLORIDE 0.9 % IV SOLN
500.0000 mg | Freq: Once | INTRAVENOUS | Status: DC
  Administered 2015-07-01: 500 mg via INTRAVENOUS
  Filled 2015-07-01: qty 500

## 2015-07-01 MED ORDER — SACCHAROMYCES BOULARDII 250 MG PO CAPS
250.0000 mg | ORAL_CAPSULE | Freq: Two times a day (BID) | ORAL | Status: DC
  Administered 2015-07-02 (×2): 250 mg via ORAL
  Filled 2015-07-01 (×3): qty 1

## 2015-07-01 MED ORDER — SODIUM CHLORIDE 0.9 % IV SOLN: INTRAVENOUS | Status: DC

## 2015-07-01 MED ORDER — ENOXAPARIN SODIUM 40 MG/0.4ML ~~LOC~~ SOLN: 40.0000 mg | SUBCUTANEOUS | Status: DC

## 2015-07-01 MED ORDER — LURASIDONE HCL 40 MG PO TABS
40.0000 mg | ORAL_TABLET | Freq: Every day | ORAL | Status: DC
  Administered 2015-07-02: 40 mg via ORAL
  Filled 2015-07-01 (×2): qty 1

## 2015-07-01 MED ORDER — VITAMIN B-1 50 MG PO TABS: 250.0000 mg | ORAL_TABLET | Freq: Every day | ORAL | Status: DC

## 2015-07-01 MED ORDER — ENOXAPARIN SODIUM 80 MG/0.8ML ~~LOC~~ SOLN
70.0000 mg | SUBCUTANEOUS | Status: DC
  Administered 2015-07-02: 70 mg via SUBCUTANEOUS
  Filled 2015-07-01 (×2): qty 0.8

## 2015-07-01 NOTE — ED Notes (Signed)
Delay on lab draw, pt not in room 

## 2015-07-01 NOTE — Progress Notes (Signed)
ANTIBIOTIC CONSULT NOTE - INITIAL  Pharmacy Consult for primaxin Indication: ESBL infection  Allergies  Allergen Reactions  . Tramadol Anaphylaxis, Shortness Of Breath and Swelling  . Sulfa Antibiotics Swelling  . Citalopram     Possible cause of pancytopenia  . Lamotrigine     Possible cause of pancytopenia    Patient Measurements:   Weight: 139.7kg  Vital Signs: BP: 157/93 mmHg (09/10 1443) Pulse Rate: 109 (09/10 1443) Intake/Output from previous day:   Intake/Output from this shift:    Labs:  Recent Labs  07/01/15 1341  HGB 15.3*  CREATININE 0.80   Estimated Creatinine Clearance: 129.3 mL/min (by C-G formula based on Cr of 0.8). No results for input(s): VANCOTROUGH, VANCOPEAK, VANCORANDOM, GENTTROUGH, GENTPEAK, GENTRANDOM, TOBRATROUGH, TOBRAPEAK, TOBRARND, AMIKACINPEAK, AMIKACINTROU, AMIKACIN in the last 72 hours.   Microbiology: No results found for this or any previous visit (from the past 720 hour(s)).  Medical History: Past Medical History  Diagnosis Date  . Mental disorder   . Bipolar 1 disorder   . Bipolar 1 disorder   . Yeast infection of the vagina   . Bipolar disorder   . Obesity   . UTI (lower urinary tract infection)   . Alcohol abuse   . Shortness of breath dyspnea   . Anemia   . 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  Today came to the hospital by EMS after patient fell off her toilet in the bathroom. Patient did not lose consciousness or strike her head.  In the ED she was found to have abnormal UA, started on Primaxin as she has a history of ESBL in the past.  Goal of Therapy:  primaxin per renal function  Plan:  primaxin  500mg  IV q6h Follow cultures, clinical course  Dolly Rias RPh 07/01/2015, 5:28 PM Pager [phone]

## 2015-07-01 NOTE — ED Notes (Signed)
Pt used bedpan. Urine at bedside.

## 2015-07-01 NOTE — ED Notes (Addendum)
PER- EMS pt picked up from home c/o fall in bathroom, "slid of toilet" at around [place]. Reports taking codeine for l foot pain. Pt has hx of l foot pain.  After taking codeine pt started feeling lethargic, drowsy, and "out of it'.  Denies head injury, deformities, LOC, or n/v.  No overdose.  Arrived to ed alert and oriented x4 per baseline. Pt states symptoms due to taking mediation on an empty stomach

## 2015-07-01 NOTE — ED Notes (Signed)
Bed: [facility] Expected date:  Expected time:  Means of arrival:  Comments: 

## 2015-07-01 NOTE — H&P (Signed)
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 Today came to the hospital by EMS after patient fell off her toilet in the bathroom. Patient did not lose consciousness or strike her head. Patient says that she was dizzy but could not get up and had to crawl to the living room. She denies any pain at this time. No chest pain, endorses mild shortness of breath on exertion. No nausea vomiting or diarrhea. No constipation. No dysuria urgency or frequency of urination but says that the "urine is smelly". In the ED she was found to have abnormal UA, started on Primaxin as she has a history of ESBL in the past.  Allergies:   Allergies  Allergen Reactions  . Tramadol Anaphylaxis, Shortness Of Breath and Swelling  . Sulfa Antibiotics Swelling  . Citalopram     Possible cause of pancytopenia  . Lamotrigine     Possible cause of pancytopenia      Past Medical History  Diagnosis Date  . Mental disorder   . Bipolar 1 disorder   . Bipolar 1 disorder   . Yeast infection of the vagina   . Bipolar disorder   . Obesity   . UTI (lower urinary tract infection)   . Alcohol abuse   . Shortness of breath dyspnea   . Anemia   . 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 Cholecystectomy      Prior to Admission medications   Medication Sig Start Date End Date Taking? Authorizing Provider  aspirin 325 MG tablet Take 325 mg by mouth daily as needed for mild pain.   Yes Historical Provider, MD  clotrimazole-betamethasone (LOTRISONE) cream Apply 1 application topically 2 (two) times daily as needed  (irritation).  04/19/15  Yes Historical Provider, MD  cyanocobalamin (,VITAMIN B-12,) 1000 MCG/ML injection Inject subcutaneously q Daily till 02/27/15,then, Inject subcutaneously q weekly for 4 weeks from 02/28/15, Then from 03/29/15-inject subcutaneously q monthly and stay on it Patient taking differently: Inject 1,000 mcg into the skin every 30 (thirty) days. Next injection due 06/29/15 02/23/15  Yes Shanker Kristeen Mans, MD  folic acid (FOLVITE) 1 MG tablet Take 1 tablet (1 mg total) by mouth daily. 02/23/15  Yes Shanker Kristeen Mans, MD  HYDROcodone-acetaminophen (NORCO) 5-325 MG per tablet Take 1 tablet by mouth every 6 (six) hours as needed. Patient taking differently: Take 1 tablet by mouth every 6 (six) hours as needed for moderate pain.  06/26/15  Yes Tatyana Kirichenko, PA-C  lurasidone (LATUDA) 40 MG TABS tablet Take 40 mg by mouth daily with breakfast.   Yes Historical Provider, MD  phenazopyridine (PYRIDIUM) 200 MG tablet Take 1 tablet (200 mg total) by mouth 3 (three) times daily with meals. 06/15/15  Yes Junius Creamer, NP  Thiamine HCl (VITAMIN B-1) 250 MG tablet Take 250 mg by mouth daily.    Yes Historical Provider, MD  docusate sodium (COLACE) 100 MG capsule Take 1 capsule (100 mg total) by mouth every 12 (twelve) hours. Patient not taking: Reported on 05/17/2015 05/12/15   Donne Hazel, MD  nitrofurantoin (MACRODANTIN) 100 MG capsule Take 1 capsule (100 mg total) by  mouth 2 (two) times daily. Patient not taking: Reported on 06/26/2015 06/15/15   Junius Creamer, NP  ondansetron (ZOFRAN) 4 MG tablet Take 1 tablet (4 mg total) by mouth every 6 (six) hours. Patient not taking: Reported on 06/26/2015 04/22/15   Elmyra Ricks Pisciotta, PA-C  PARoxetine (PAXIL-CR) 25 MG 24 hr tablet Take 1 tablet (25 mg total) by mouth daily. Patient not taking: Reported on 04/21/2015 02/23/15   Jonetta Osgood, MD  polyethylene glycol [facility] / Floria Raveling) packet Take 17 g by mouth daily as needed for mild constipation. Patient not taking:  Reported on 05/17/2015 05/12/15   Donne Hazel, MD  saccharomyces boulardii (FLORASTOR) 250 MG capsule Take 1 capsule (250 mg total) by mouth 2 (two) times daily. Patient not taking: Reported on 06/26/2015 05/19/15   Janece Canterbury, MD    Social History:  reports that she has never smoked. She has never used smokeless tobacco. She reports that she does not drink alcohol or use illicit drugs.  Family history Patient's father had cancer  All the positives are listed in BOLD  Review of Systems:  HEENT: Headache, blurred vision, runny nose, sore throat, dizziness Neck: Hypothyroidism, hyperthyroidism,,lymphadenopathy Chest : Shortness of breath, history of COPD, Asthma Heart : Chest pain, history of coronary arterey disease GI:  Nausea, vomiting, diarrhea, constipation, GERD GU: Dysuria, urgency, frequency of urination, hematuria Neuro: Stroke, seizures, syncope Psych: Depression, anxiety, hallucinations, bipolar disorder   Physical Exam: Blood pressure 157/93, pulse 109, resp. rate 18, last menstrual period 09/13/2014, SpO2 95 %. Constitutional:   Patient is a well-developed and well-nourished female* in no acute distress and cooperative with exam. Head: Normocephalic and atraumatic Mouth: Mucus membranes moist Eyes: PERRL, EOMI, conjunctivae normal Neck: Supple, No Thyromegaly Cardiovascular: RRR, S1 normal, S2 normal Pulmonary/Chest: Bibasilar crackles Abdominal: Soft. Non-tender, non-distended, bowel sounds are normal, no masses, organomegaly, or guarding present.  Neurological: A&O x3, Strength is normal and symmetric bilaterally, cranial nerve II-XII are grossly intact, no focal motor deficit, sensory intact to light touch bilaterally.  Extremities : No Cyanosis, Clubbing or Edema  Labs on Admission:  Basic Metabolic Panel:  Recent Labs Lab 07/01/15 1341  NA 141  K 3.9  CL 103  GLUCOSE 93  BUN 11  CREATININE 0.80   CBC:  Recent Labs Lab 07/01/15 1341  HGB 15.3*    HCT 45.0   Cardiac Enzymes: No results for input(s): CKTOTAL, CKMB, CKMBINDEX, TROPONINI in the last 168 hours.  BNP (last 3 results)  Recent Labs  03/03/15 0028  BNP 31.7     Assessment/Plan Active Problems:   Bipolar disorder   Dizziness   UTI (lower urinary tract infection)   ESBL (extended spectrum beta-lactamase) producing bacteria infection   Urinary tract infection   Diastolic dysfunction  UTI Patient has history of ESBL UTI, will empirically start on Primaxin performs a consultation. Follow urine culture results.  Diastolic heart failure Patient has bibasilar crackles, not requiring oxygen. Will not give IV fluids at this time as it exacerbated the chronic diastolic heart failure. Echocardiogram from May 2016 showed grade 2 diastolic dysfunction Will obtain chest x-ray, and will start by mouth Lasix if it shows pulmonary edema.  Bipolar disorder Continue Latuda, Paxil  Fall Patient felt dizzy and fell denies loss of consciousness Will get PT OT evaluation  DVT prophylaxis Lovenox  Code status: Full code  Family discussion: No family at bedside, patient lives with boyfriend who is also admitted to the hospital.   Time Spent on Admission:  60 min  LAMA,GAGAN S [facility] Pager: [phone] 07/01/2015, 4:34 PM  If 7PM-7AM, please contact night-coverage  [email or web address]  Password [email or web address]

## 2015-07-01 NOTE — ED Notes (Signed)
Bed: [facility]PT4 Expected date: 07/01/15 Expected time: 10:28 AM Means of arrival: Ambulance Comments: Pt sleepy from taking Ibuprofen this morning

## 2015-07-01 NOTE — ED Provider Notes (Signed)
CSN: [id number]     Arrival date & time 07/01/15  1028 History   First MD Initiated Contact with Patient 07/01/15 1048     Chief Complaint  Patient presents with  . Fall  . Fatigue     (Consider location/radiation/quality/duration/timing/severity/associated sxs/prior Treatment) HPI Comments: 49 year old female with history of bipolar disorder, alcohol abuse, recurrent UTI including ESBL who p/w fall. At 8 AM, the patient slid off of her toilet and fell in her bathroom. She did not lose consciousness or strike her head. She states that she took codeine and began feeling drowsy and "out of it." She was brought in by EMS. Patient currently denies any pain. No nausea/vomiting, urinary symptoms, fevers, diarrhea, or change in appetite. She has noticed some decreased urination today. She denies any injuries from the fall.  Patient is a 49 y.o. female presenting with fall. The history is provided by the patient.  Fall    Past Medical History  Diagnosis Date  . Mental disorder   . Bipolar 1 disorder   . Bipolar 1 disorder   . Yeast infection of the vagina   . Bipolar disorder   . Obesity   . UTI (lower urinary tract infection)   . Alcohol abuse   . Shortness of breath dyspnea   . Anemia   . 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 Cholecystectomy     History reviewed. No pertinent family history. Social History  Substance Use Topics  . Smoking status: Never Smoker   . Smokeless tobacco: Never Used  . Alcohol Use: No   OB History    Gravida Para Term Preterm AB TAB SAB Ectopic Multiple Living   1 1 1       1      Review of Systems  10 Systems reviewed and are negative for acute change except as noted in the HPI.   Allergies  Tramadol; Sulfa antibiotics; Citalopram; and Lamotrigine  Home Medications   Prior to Admission medications   Medication Sig Start Date End Date Taking? Authorizing Provider   aspirin 325 MG tablet Take 325 mg by mouth daily as needed for mild pain.   Yes Historical Provider, MD  clotrimazole-betamethasone (LOTRISONE) cream Apply 1 application topically 2 (two) times daily as needed (irritation).  04/19/15  Yes Historical Provider, MD  cyanocobalamin (,VITAMIN B-12,) 1000 MCG/ML injection Inject subcutaneously q Daily till 02/27/15,then, Inject subcutaneously q weekly for 4 weeks from 02/28/15, Then from 03/29/15-inject subcutaneously q monthly and stay on it Patient taking differently: Inject 1,000 mcg into the skin every 30 (thirty) days. Next injection due 06/29/15 02/23/15  Yes Shanker Kristeen Mans, MD  folic acid (FOLVITE) 1 MG tablet Take 1 tablet (1 mg total) by mouth daily. 02/23/15  Yes Shanker Kristeen Mans, MD  HYDROcodone-acetaminophen (NORCO) 5-325 MG per tablet Take 1 tablet by mouth every 6 (six) hours as needed. Patient taking differently: Take 1 tablet by mouth every 6 (six) hours as needed for moderate pain.  06/26/15  Yes Tatyana Kirichenko, PA-C  lurasidone (LATUDA) 40 MG TABS tablet Take 40 mg by mouth daily with breakfast.   Yes Historical Provider, MD  phenazopyridine (PYRIDIUM) 200 MG tablet Take 1 tablet (200 mg total) by mouth 3 (three) times daily with meals. 06/15/15  Yes Junius Creamer, NP  Thiamine HCl (VITAMIN B-1) 250 MG tablet Take 250 mg by mouth daily.    Yes Historical Provider, MD  docusate  sodium (COLACE) 100 MG capsule Take 1 capsule (100 mg total) by mouth every 12 (twelve) hours. Patient not taking: Reported on 05/17/2015 05/12/15   Donne Hazel, MD  nitrofurantoin (MACRODANTIN) 100 MG capsule Take 1 capsule (100 mg total) by mouth 2 (two) times daily. Patient not taking: Reported on 06/26/2015 06/15/15   Junius Creamer, NP  ondansetron (ZOFRAN) 4 MG tablet Take 1 tablet (4 mg total) by mouth every 6 (six) hours. Patient not taking: Reported on 06/26/2015 04/22/15   Elmyra Ricks Pisciotta, PA-C  PARoxetine (PAXIL-CR) 25 MG 24 hr tablet Take 1 tablet (25 mg total) by  mouth daily. Patient not taking: Reported on 04/21/2015 02/23/15   Jonetta Osgood, MD  polyethylene glycol [facility] / Floria Raveling) packet Take 17 g by mouth daily as needed for mild constipation. Patient not taking: Reported on 05/17/2015 05/12/15   Donne Hazel, MD  saccharomyces boulardii (FLORASTOR) 250 MG capsule Take 1 capsule (250 mg total) by mouth 2 (two) times daily. Patient not taking: Reported on 06/26/2015 05/19/15   Janece Canterbury, MD   BP 157/93 mmHg  Pulse 109  Resp 18  SpO2 95%  LMP 09/13/2014 Physical Exam  Constitutional: She is oriented to person, place, and time.  Obese female sitting up in bed, malodorous smell in room, NAD  HENT:  Head: Normocephalic and atraumatic.  Mouth/Throat: Oropharynx is clear and moist.  Moist mucous membranes  Eyes: Conjunctivae are normal. Pupils are equal, round, and reactive to light.  Neck: Neck supple.  Cardiovascular: Normal rate, regular rhythm and normal heart sounds.   No murmur heard. Pulmonary/Chest: Effort normal and breath sounds normal.  Abdominal: Soft. Bowel sounds are normal. She exhibits no distension. There is no tenderness.  Musculoskeletal:  Mild b/l LE edema  Neurological: She is alert and oriented to person, place, and time.  Fluent speech  Skin: Skin is warm and dry.  Psychiatric:  Flat affect, pauses for long periods during conversation  Nursing note and vitals reviewed.   ED Course  Procedures (including critical care time) Labs Review Labs Reviewed  URINALYSIS, ROUTINE W REFLEX MICROSCOPIC (NOT AT [facility]) - Abnormal; Notable for the following:    Color, Urine ORANGE (*)    APPearance TURBID (*)    Hgb urine dipstick MODERATE (*)    Bilirubin Urine SMALL (*)    Ketones, ur >80 (*)    Protein, ur 100 (*)    Nitrite POSITIVE (*)    Leukocytes, UA LARGE (*)    All other components within normal limits  URINE MICROSCOPIC-ADD ON - Abnormal; Notable for the following:    Bacteria, UA MANY (*)    All other  components within normal limits  I-STAT CHEM 8, ED - Abnormal; Notable for the following:    Hemoglobin 15.3 (*)    All other components within normal limits  URINE CULTURE    Imaging Review No results found. I have personally reviewed and evaluated these lab results as part of my medical decision-making.   EKG Interpretation None     Medications  sodium chloride 0.9 % bolus 1,000 mL (not administered)  imipenem-cilastatin (PRIMAXIN) 500 mg in sodium chloride 0.9 % 100 mL IVPB (not administered)  cephALEXin (KEFLEX) capsule 500 mg (500 mg Oral Given 07/01/15 0600)    MDM   Final diagnoses:  None  UTI fall H/o ESBL UTI H/o bipolar disorder  49 year old female with history of recurrent UTIs with ESBL cultures and history of sepsis secondary to UTI who presents  with fall this morning in her bathroom. On arrival by EMS, the patient is awake, alert, and in no acute distress. Vital signs unremarkable. No evidence of trauma on exam. She denies striking her head or suffering any injuries. No abdominal tenderness on exam. Without any signs of injury and no complaints of pain on exam, I do not feel that she needs any imaging at this time. Obtained above lab work which shows evidence of urinary tract infection with nitrites, large amount of wbc's and rbc's, and many bacteria. Because of the patient's history of ESBL UTI, she will require IV antibiotics based on my review of her recent cultures. Urine also suggests mild dehydration with ketones and protein. Gave the patient an IV fluid bolus as well as imipenem. The patient will require admission to general medicine for IV antibiotic therapy for her urinary tract infection.  Sharlett Iles, MD 07/01/15 [phone]

## 2015-07-01 NOTE — ED Notes (Signed)
MD at bedside. 

## 2015-07-01 NOTE — ED Notes (Signed)
Pt ate a sandwich and drank some Coke.

## 2015-07-02 LAB — CBC
HCT: 38.9 % (ref 36.0–46.0)
Hemoglobin: 13.3 g/dL (ref 12.0–15.0)
MCH: 29.7 pg (ref 26.0–34.0)
MCHC: 34.2 g/dL (ref 30.0–36.0)
MCV: 86.8 fL (ref 78.0–100.0)
Platelets: 165 10*3/uL (ref 150–400)
RBC: 4.48 MIL/uL (ref 3.87–5.11)
RDW: 13.5 % (ref 11.5–15.5)
WBC: 4.5 10*3/uL (ref 4.0–10.5)

## 2015-07-02 LAB — COMPREHENSIVE METABOLIC PANEL
ALT: 23 U/L (ref 14–54)
AST: 27 U/L (ref 15–41)
Albumin: 3.6 g/dL (ref 3.5–5.0)
Alkaline Phosphatase: 92 U/L (ref 38–126)
Anion gap: 8 (ref 5–15)
BUN: 12 mg/dL (ref 6–20)
CO2: 24 mmol/L (ref 22–32)
Calcium: 9 mg/dL (ref 8.9–10.3)
Chloride: 108 mmol/L (ref 101–111)
Creatinine, Ser: 0.63 mg/dL (ref 0.44–1.00)
GFR calc Af Amer: >60 mL/min (ref >60)
GFR calc non Af Amer: >60 mL/min (ref >60)
Glucose, Bld: 95 mg/dL (ref 65–99)
Potassium: 3.3 mmol/L — ABNORMAL LOW (ref 3.5–5.1)
Sodium: 140 mmol/L (ref 135–145)
Total Bilirubin: 1.9 mg/dL — ABNORMAL HIGH (ref 0.3–1.2)
Total Protein: 6.1 g/dL — ABNORMAL LOW (ref 6.5–8.1)

## 2015-07-02 MED ORDER — POTASSIUM CHLORIDE CRYS ER 20 MEQ PO TBCR
40.0000 meq | EXTENDED_RELEASE_TABLET | Freq: Once | ORAL | Status: AC
  Administered 2015-07-02: 40 meq via ORAL
  Filled 2015-07-02: qty 2

## 2015-07-02 NOTE — Progress Notes (Signed)
Patient demanding to leave AMA.  Boyfriend spoke with me on the phone per patient request, and he reports he is sending a cab for patient.  Patient educated and understands that it is not advised for her to leave AMA.  Physician spoke with her this morning explaining the risks of leaving AMA, and nurse spoke with her this morning and now.  Patient alert and oriented, and states she understands the risks of leaving AMA.  Physician notified of patient leaving AMA at this time.

## 2015-07-02 NOTE — Progress Notes (Signed)
[facility] PROGRESS NOTE  Jordan [name] YNW:[id number] DOB: [birth year] DOA: 07/01/2015 PCP: [name], FNP  Assessment/Plan: *UTI Patient has history of ESBL UTI, empirically started on Primaxin  Follow urine culture results.  Diastolic heart failure Patient has bibasilar crackles, not requiring oxygen. Will not give IV fluids at this time as it can exacerbate the chronic diastolic heart failure. Echocardiogram from May 2016 showed grade 2 diastolic dysfunction Chest x-ray is clear. 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 Today came to the hospital by EMS after patient fell off her toilet in the bathroom. Patient did not lose consciousness or strike her head. Patient says that she was dizzy but could not get up and had to crawl to the living room. She denies any pain at this time. No chest pain, endorses mild shortness of breath on exertion. No nausea vomiting or diarrhea. No constipation. No dysuria urgency or frequency of urination but says that the "urine is smelly".  This morning patient says that she wants to go home. Denies dysuria. Urine culture is still pending  Objective: Filed Vitals:   07/02/15 1347  BP: 134/70  Pulse: 99  Temp: 98.7 F (37.1 C)  Resp: 18    Intake/Output Summary (Last 24 hours) at 07/02/15 1437 Last data filed at 07/01/15 2315  Gross per 24  hour  Intake    360 ml  Output      0 ml  Net    360 ml   Filed Weights   07/01/15 1715  Weight: 145.151 kg (320 lb)    Exam:   General:  Appears in no acute distress  Cardiovascular: S1-S2 regular  Respiratory: Clear to auscultation bilaterally  Abdomen: Soft, nontender, no organomegaly  Musculoskeletal: No edema of the lower extremities   Data Reviewed: Basic Metabolic Panel:  Recent Labs Lab 07/01/15 1341 07/01/15 2005 07/02/15 0554  NA 141  --  140  K 3.9  --  3.3*  CL 103  --  108  CO2  --   --  24  GLUCOSE 93  --  95  BUN 11  --  12  CREATININE 0.80 0.69 0.63  CALCIUM  --   --  9.0   Liver Function Tests:  Recent Labs Lab 07/02/15 0554  AST 27  ALT 23  ALKPHOS 92  BILITOT 1.9*  PROT 6.1*  ALBUMIN 3.6   No results for input(s): LIPASE, AMYLASE in the last 168 hours. No results for input(s): AMMONIA in the last 168 hours. CBC:  Recent Labs Lab 07/01/15 1341 07/01/15 2005 07/02/15 0554  WBC  --  6.8 4.5  HGB 15.3* 13.4 13.3  HCT 45.0 39.2 38.9  MCV  --  86.9 86.8  PLT  --  165 165   Cardiac Enzymes: No results for input(s): CKTOTAL, CKMB, CKMBINDEX, TROPONINI in the last 168 hours. BNP (last 3 results)  Recent Labs  03/03/15 0028 07/01/15 2005  BNP 31.7 16.0    ProBNP (last  3 results) No results for input(s): PROBNP in the last 8760 hours.  CBG: No results for input(s): GLUCAP in the last 168 hours.  No results found for this or any previous visit (from the past 240 hour(s)).   Studies: Dg Chest 2 View  07/01/2015   CLINICAL DATA:  Dyspnea for 3 months.  EXAM: CHEST  2 VIEW  COMPARISON:  April 30, 2015.  FINDINGS: The heart size and mediastinal contours are within normal limits. Both lungs are clear. No pneumothorax or pleural effusion is noted. The visualized skeletal structures are unremarkable.  IMPRESSION: No active cardiopulmonary disease.   Electronically Signed   By: Marijo Conception, M.D.   On: 07/01/2015 17:56     Scheduled Meds: . docusate sodium  100 mg Oral BID  . enoxaparin (LOVENOX) injection  70 mg Subcutaneous Q24H  . folic acid  1 mg Oral Daily  . imipenem-cilastatin  500 mg Intravenous 4 times per day  . lurasidone  40 mg Oral Q breakfast  . PARoxetine  25 mg Oral Daily  . saccharomyces boulardii  250 mg Oral BID  . thiamine  250 mg Oral Daily   Continuous Infusions: . sodium chloride      Active Problems:   Bipolar disorder   Dizziness   UTI (lower urinary tract infection)   ESBL (extended spectrum beta-lactamase) producing bacteria infection   Urinary tract infection   Diastolic dysfunction    Time spent: 25 min    [facility] Pager [phone]*. If 7PM-7AM, please contact night-coverage at [email or web address], password [facility] 07/02/2015, 2:37 PM  LOS: 1 day

## 2015-07-05 LAB — URINE CULTURE: Culture: >=100000

## 2015-07-13 ENCOUNTER — Encounter (HOSPITAL_COMMUNITY): Payer: Self-pay | Admitting: *Deleted

## 2015-07-13 ENCOUNTER — Emergency Department (HOSPITAL_COMMUNITY)
Admission: EM | Admit: 2015-07-13 | Discharge: 2015-07-13 | Discharge status: Against Medical Advice | Payer: Medicaid Other | Attending: Emergency Medicine | Admitting: Emergency Medicine

## 2015-07-13 DIAGNOSIS — R350 Frequency of micturition: Secondary | ICD-10-CM | POA: Diagnosis not present

## 2015-07-13 DIAGNOSIS — L02416 Cutaneous abscess of left lower limb: Secondary | ICD-10-CM | POA: Insufficient documentation

## 2015-07-13 DIAGNOSIS — E669 Obesity, unspecified: Secondary | ICD-10-CM | POA: Insufficient documentation

## 2015-07-13 LAB — URINALYSIS, ROUTINE W REFLEX MICROSCOPIC
Glucose, UA: NEGATIVE mg/dL
Ketones, ur: NEGATIVE mg/dL
Nitrite: POSITIVE — AB
Protein, ur: 100 mg/dL — AB
Specific Gravity, Urine: 1.027 (ref 1.005–1.030)
Urobilinogen, UA: 1 mg/dL (ref 0.0–1.0)
pH: 6 (ref 5.0–8.0)

## 2015-07-13 LAB — URINE MICROSCOPIC-ADD ON

## 2015-07-13 LAB — POC URINE PREG, ED: Preg Test, Ur: NEGATIVE

## 2015-07-13 NOTE — ED Notes (Signed)
Patient states that she has abscess to left inner thigh x two weeks. Patient also reports urinary frequency.

## 2015-07-14 ENCOUNTER — Emergency Department (HOSPITAL_COMMUNITY)
Admission: EM | Admit: 2015-07-14 | Discharge: 2015-07-14 | Disposition: A | Discharge status: Home / Self Care | Payer: Medicaid Other | Attending: Emergency Medicine | Admitting: Emergency Medicine

## 2015-07-14 ENCOUNTER — Encounter (HOSPITAL_COMMUNITY): Payer: Self-pay | Admitting: *Deleted

## 2015-07-14 DIAGNOSIS — E669 Obesity, unspecified: Secondary | ICD-10-CM | POA: Diagnosis not present

## 2015-07-14 DIAGNOSIS — F319 Bipolar disorder, unspecified: Secondary | ICD-10-CM | POA: Diagnosis not present

## 2015-07-14 DIAGNOSIS — Z79899 Other long term (current) drug therapy: Secondary | ICD-10-CM | POA: Insufficient documentation

## 2015-07-14 DIAGNOSIS — Z8742 Personal history of other diseases of the female genital tract: Secondary | ICD-10-CM | POA: Insufficient documentation

## 2015-07-14 DIAGNOSIS — Z791 Long term (current) use of non-steroidal anti-inflammatories (NSAID): Secondary | ICD-10-CM | POA: Diagnosis not present

## 2015-07-14 DIAGNOSIS — D649 Anemia, unspecified: Secondary | ICD-10-CM | POA: Diagnosis not present

## 2015-07-14 DIAGNOSIS — L089 Local infection of the skin and subcutaneous tissue, unspecified: Secondary | ICD-10-CM | POA: Diagnosis present

## 2015-07-14 DIAGNOSIS — Z7982 Long term (current) use of aspirin: Secondary | ICD-10-CM | POA: Diagnosis not present

## 2015-07-14 DIAGNOSIS — Z87440 Personal history of urinary (tract) infections: Secondary | ICD-10-CM | POA: Diagnosis not present

## 2015-07-14 DIAGNOSIS — M65052 Abscess of tendon sheath, left thigh: Secondary | ICD-10-CM | POA: Diagnosis not present

## 2015-07-14 DIAGNOSIS — L0291 Cutaneous abscess, unspecified: Secondary | ICD-10-CM

## 2015-07-14 NOTE — Discharge Instructions (Signed)

## 2015-07-14 NOTE — ED Notes (Addendum)
Pt states that she has had a boil to her left inner thigh for several weeks. Pt denies fevers/chills. Pt states that she has had drainage and reddness from area. Pt also reports strong odor to urine. Pt was seen last night but left. Urine sample was sent and resulted.

## 2015-07-14 NOTE — ED Provider Notes (Signed)
CSN: [id number]     Arrival date & time 07/14/15  1041 History   First MD Initiated Contact with Patient 07/14/15 1052     Chief Complaint  Patient presents with  . Recurrent Skin Infections  . Urinary Tract Infection     (Consider location/radiation/quality/duration/timing/severity/associated sxs/prior Treatment) Patient is a 49 y.o. female presenting with general illness.  Illness Location:  L medial thigh Quality:  Sharp pain, draining Severity:  Moderate Onset quality:  Gradual Duration:  2 weeks Timing:  Constant Progression:  Unchanged Chronicity:  Recurrent Context:  Had a sore spot with pus, opened with needle Relieved by:  Nothing Worsened by:  Palpation, pressure Associated symptoms: no abdominal pain, no diarrhea, no fatigue, no fever, no nausea and no vomiting     Past Medical History  Diagnosis Date  . Mental disorder   . Bipolar 1 disorder   . Bipolar 1 disorder   . Yeast infection of the vagina   . Bipolar disorder   . Obesity   . UTI (lower urinary tract infection)   . Alcohol abuse   . Shortness of breath dyspnea   . Anemia   . 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 Cholecystectomy     No family history on file. Social History  Substance Use Topics  . Smoking status: Never Smoker   . Smokeless tobacco: Never Used  . Alcohol Use: No   OB History    Gravida Para Term Preterm AB TAB SAB Ectopic Multiple Living   1 1 1       1      Review of Systems  Constitutional: Negative for fever and fatigue.  Gastrointestinal: Negative for nausea, vomiting, abdominal pain and diarrhea.  All other systems reviewed and are negative.     Allergies  Tramadol; Sulfa antibiotics; Citalopram; and Lamotrigine  Home Medications   Prior to Admission medications   Medication Sig Start Date End Date Taking? Authorizing Provider  ALPRAZolam Duanne Moron) 0.5 MG tablet Take 0.5 mg by mouth 3  (three) times daily as needed for anxiety.   Yes Historical Provider, MD  aspirin 325 MG tablet Take 325 mg by mouth daily as needed for mild pain.   Yes Historical Provider, MD  clotrimazole-betamethasone (LOTRISONE) cream Apply 1 application topically 2 (two) times daily as needed (irritation).  04/19/15  Yes Historical Provider, MD  cyanocobalamin (,VITAMIN B-12,) 1000 MCG/ML injection Inject subcutaneously q Daily till 02/27/15,then, Inject subcutaneously q weekly for 4 weeks from 02/28/15, Then from 03/29/15-inject subcutaneously q monthly and stay on it Patient taking differently: Inject 1,000 mcg into the skin every 30 (thirty) days. Next injection due 06/29/15 02/23/15  Yes Shanker Kristeen Mans, MD  folic acid (FOLVITE) 1 MG tablet Take 1 tablet (1 mg total) by mouth daily. 02/23/15  Yes Shanker Kristeen Mans, MD  lurasidone (LATUDA) 40 MG TABS tablet Take 40 mg by mouth daily with breakfast.   Yes Historical Provider, MD  meloxicam (MOBIC) 15 MG tablet Take 15 mg by mouth daily.   Yes Historical Provider, MD  ondansetron (ZOFRAN) 4 MG tablet Take 1 tablet (4 mg total) by mouth every 6 (six) hours. 04/22/15  Yes Nicole Pisciotta, PA-C  polyethylene glycol (MIRALAX / GLYCOLAX) packet Take 17 g by mouth daily as needed for mild constipation. 05/12/15  Yes Donne Hazel, MD  Thiamine HCl (VITAMIN B-1) 250 MG tablet Take 250 mg by mouth daily.  Yes Historical Provider, MD  docusate sodium (COLACE) 100 MG capsule Take 1 capsule (100 mg total) by mouth every 12 (twelve) hours. Patient not taking: Reported on 05/17/2015 05/12/15   Donne Hazel, MD  HYDROcodone-acetaminophen [facility]) 5-325 MG per tablet Take 1 tablet by mouth every 6 (six) hours as needed. Patient not taking: Reported on 07/14/2015 06/26/15   Jeannett Senior, PA-C  nitrofurantoin (MACRODANTIN) 100 MG capsule Take 1 capsule (100 mg total) by mouth 2 (two) times daily. Patient not taking: Reported on 06/26/2015 06/15/15   Junius Creamer, NP  PARoxetine  (PAXIL-CR) 25 MG 24 hr tablet Take 1 tablet (25 mg total) by mouth daily. Patient not taking: Reported on 04/21/2015 02/23/15   Jonetta Osgood, MD  phenazopyridine (PYRIDIUM) 200 MG tablet Take 1 tablet (200 mg total) by mouth 3 (three) times daily with meals. Patient not taking: Reported on 07/14/2015 06/15/15   Junius Creamer, NP  saccharomyces boulardii (FLORASTOR) 250 MG capsule Take 1 capsule (250 mg total) by mouth 2 (two) times daily. Patient not taking: Reported on 06/26/2015 05/19/15   Janece Canterbury, MD   BP 141/78 mmHg  Pulse 97  Temp(Src) 97.9 F (36.6 C) (Oral)  Resp 16  SpO2 97%  LMP 09/13/2014 Physical Exam  Constitutional: She is oriented to person, place, and time. She appears well-developed and well-nourished.  HENT:  Head: Normocephalic and atraumatic.  Right Ear: External ear normal.  Left Ear: External ear normal.  Eyes: Conjunctivae and EOM are normal. Pupils are equal, round, and reactive to light.  Neck: Normal range of motion. Neck supple.  Cardiovascular: Normal rate, regular rhythm, normal heart sounds and intact distal pulses.   Pulmonary/Chest: Effort normal and breath sounds normal.  Abdominal: Soft. Bowel sounds are normal. There is no tenderness.  Musculoskeletal: Normal range of motion.  Neurological: She is alert and oriented to person, place, and time.  Skin: Skin is warm and dry.  3 cm wound with likely underlying abscess cavity, no expressable purulence or fluctuance.  No surrounding erythema  Vitals reviewed.   ED Course  Procedures (including critical care time) Labs Review Labs Reviewed - No data to display  Imaging Review No results found. I have personally reviewed and evaluated these images and lab results as part of my medical decision-making.   EKG Interpretation None      MDM   Final diagnoses:  Abscess    49 y.o. female with pertinent PMH of recurrent ESBL UTI, obesity, bipolar 1 presents with concern for L thigh wound.   History consistent with abscess which the pt opened with a needle and per her report expressed purulence.  No systemic symptoms.  No dysuria or change in chronic urinary symptoms.  Physical exam as above.  Pt has more than sufficient tract for more drainage.  DC home in stable condition.    I have reviewed all laboratory and imaging studies if ordered as above  1. Abscess         Debby Freiberg, MD 07/14/15 1141

## 2015-07-15 ENCOUNTER — Emergency Department (HOSPITAL_COMMUNITY)
Admission: EM | Admit: 2015-07-15 | Discharge: 2015-07-16 | Disposition: A | Discharge status: Home / Self Care | Payer: Medicaid Other | Attending: Emergency Medicine | Admitting: Emergency Medicine

## 2015-07-15 ENCOUNTER — Encounter (HOSPITAL_COMMUNITY): Payer: Self-pay | Admitting: Emergency Medicine

## 2015-07-15 DIAGNOSIS — E669 Obesity, unspecified: Secondary | ICD-10-CM | POA: Insufficient documentation

## 2015-07-15 DIAGNOSIS — Z7982 Long term (current) use of aspirin: Secondary | ICD-10-CM | POA: Diagnosis not present

## 2015-07-15 DIAGNOSIS — N390 Urinary tract infection, site not specified: Secondary | ICD-10-CM

## 2015-07-15 DIAGNOSIS — D531 Other megaloblastic anemias, not elsewhere classified: Secondary | ICD-10-CM | POA: Diagnosis not present

## 2015-07-15 DIAGNOSIS — Z79899 Other long term (current) drug therapy: Secondary | ICD-10-CM | POA: Diagnosis not present

## 2015-07-15 DIAGNOSIS — Z8619 Personal history of other infectious and parasitic diseases: Secondary | ICD-10-CM | POA: Insufficient documentation

## 2015-07-15 DIAGNOSIS — Z791 Long term (current) use of non-steroidal anti-inflammatories (NSAID): Secondary | ICD-10-CM | POA: Insufficient documentation

## 2015-07-15 DIAGNOSIS — F319 Bipolar disorder, unspecified: Secondary | ICD-10-CM | POA: Diagnosis not present

## 2015-07-15 DIAGNOSIS — N72 Inflammatory disease of cervix uteri: Secondary | ICD-10-CM | POA: Diagnosis not present

## 2015-07-15 DIAGNOSIS — R102 Pelvic and perineal pain: Secondary | ICD-10-CM | POA: Diagnosis present

## 2015-07-15 LAB — URINALYSIS, ROUTINE W REFLEX MICROSCOPIC
Glucose, UA: NEGATIVE mg/dL
Ketones, ur: NEGATIVE mg/dL
Nitrite: POSITIVE — AB
Protein, ur: 30 mg/dL — AB
Specific Gravity, Urine: 1.026 (ref 1.005–1.030)
Urobilinogen, UA: 1 mg/dL (ref 0.0–1.0)
pH: 6 (ref 5.0–8.0)

## 2015-07-15 LAB — URINE MICROSCOPIC-ADD ON

## 2015-07-15 LAB — WET PREP, GENITAL
Clue Cells Wet Prep HPF POC: NONE SEEN
Trich, Wet Prep: NONE SEEN
Yeast Wet Prep HPF POC: NONE SEEN

## 2015-07-15 MED ORDER — CEPHALEXIN 500 MG PO CAPS: 500.0000 mg | ORAL_CAPSULE | Freq: Two times a day (BID) | ORAL | Status: DC

## 2015-07-15 MED ORDER — AZITHROMYCIN 250 MG PO TABS
1000.0000 mg | ORAL_TABLET | Freq: Once | ORAL | Status: AC
  Administered 2015-07-15: 1000 mg via ORAL
  Filled 2015-07-15: qty 4

## 2015-07-15 MED ORDER — LIDOCAINE HCL (PF) 1 % IJ SOLN
INTRAMUSCULAR | Status: AC
  Administered 2015-07-15: 2.1 mL
  Filled 2015-07-15: qty 5

## 2015-07-15 MED ORDER — CEFTRIAXONE SODIUM 250 MG IJ SOLR
250.0000 mg | Freq: Once | INTRAMUSCULAR | Status: AC
  Administered 2015-07-15: 250 mg via INTRAMUSCULAR
  Filled 2015-07-15: qty 250

## 2015-07-15 NOTE — ED Notes (Signed)
Per EMS pt comes from home for vaginal pain x 4 weeks without vaginal bleeding. Pt was seen at [facility] yesterday for abscess on inner thigh.  Pt states that pain is different than yesterday.

## 2015-07-15 NOTE — Discharge Instructions (Signed)
Cervicitis Cervicitis is a soreness and swelling (inflammation) of the cervix. Your cervix is located at the bottom of your uterus. It opens up to the vagina. CAUSES   Sexually transmitted infections (STIs).   Allergic reaction.   Medicines or birth control devices that are put in the vagina.   Injury to the cervix.   Bacterial infections.  RISK FACTORS You are at greater risk if you:  Have unprotected sexual intercourse.  Have sexual intercourse with many partners.  Began sexual intercourse at an early age.  Have a history of STIs. SYMPTOMS  There may be no symptoms. If symptoms occur, they may include:   Gray, white, yellow, or bad-smelling vaginal discharge.   Pain or itching of the area outside the vagina.   Painful sexual intercourse.   Lower abdominal or lower back pain, especially during intercourse.   Frequent urination.   Abnormal vaginal bleeding between periods, after sexual intercourse, or after menopause.   Pressure or a heavy feeling in the pelvis.  DIAGNOSIS  Diagnosis is made after a pelvic exam. Other tests may include:   Examination of any discharge under a microscope (wet prep).   A Pap test.  TREATMENT  Treatment will depend on the cause of cervicitis. If it is caused by an STI, both you and your partner will need to be treated. Antibiotic medicines will be given.  HOME CARE INSTRUCTIONS   Do not have sexual intercourse until your health care provider says it is okay.   Do not have sexual intercourse until your partner has been treated, if your cervicitis is caused by an STI.   Take your antibiotics as directed. Finish them even if you start to feel better.  SEEK MEDICAL CARE IF:  Your symptoms come back.   You have a fever.  MAKE SURE YOU:   Understand these instructions.  Will watch your condition.  Will get help right away if you are not doing well or get worse. Document Released: 10/07/2005 Document Revised:  10/12/2013 Document Reviewed: 03/31/2013 [facility] Patient Information 2015 [place], [place]. This information is not intended to replace advice given to you by your health care provider. Make sure you discuss any questions you have with your health care provider.  Urinary Tract Infection A urinary tract infection (UTI) can occur any place along the urinary tract. The tract includes the kidneys, ureters, bladder, and urethra. A type of germ called bacteria often causes a UTI. UTIs are often helped with antibiotic medicine.  HOME CARE   If given, take antibiotics as told by your doctor. Finish them even if you start to feel better.  Drink enough fluids to keep your pee (urine) clear or pale yellow.  Avoid tea, drinks with caffeine, and bubbly (carbonated) drinks.  Pee often. Avoid holding your pee in for a long time.  Pee before and after having sex (intercourse).  Wipe from front to back after you poop (bowel movement) if you are a woman. Use each tissue only once. GET HELP RIGHT AWAY IF:   You have back pain.  You have lower belly (abdominal) pain.  You have chills.  You feel sick to your stomach (nauseous).  You throw up (vomit).  Your burning or discomfort with peeing does not go away.  You have a fever.  Your symptoms are not better in 3 days. MAKE SURE YOU:   Understand these instructions.  Will watch your condition.  Will get help right away if you are not doing well or get  worse. Document Released: 03/25/2008 Document Revised: 07/01/2012 Document Reviewed: 05/07/2012 [facility] Patient Information 2015 [place], [place]. This information is not intended to replace advice given to you by your health care provider. Make sure you discuss any questions you have with your health care provider.

## 2015-07-15 NOTE — ED Provider Notes (Signed)
CSN: [id number]     Arrival date & time 07/15/15  1828 History   First MD Initiated Contact with Patient 07/15/15 2225     Chief Complaint  Patient presents with  . Vaginal Pain     (Consider location/radiation/quality/duration/timing/severity/associated sxs/prior Treatment) HPI Comments: Pt comes in with c/o vaginal pain and malodarous discharge. Pt denies dysuria. She states that she has had a std in the past but it has been several years. Denies fever or abdominal pain. She states that she was seen yesterday for an abscess in there groin that is draining  The history is provided by the patient. No language interpreter was used.    Past Medical History  Diagnosis Date  . Mental disorder   . Bipolar 1 disorder   . Bipolar 1 disorder   . Yeast infection of the vagina   . Bipolar disorder   . Obesity   . UTI (lower urinary tract infection)   . Alcohol abuse   . Shortness of breath dyspnea   . Anemia   . 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 Cholecystectomy     No family history on file. Social History  Substance Use Topics  . Smoking status: Never Smoker   . Smokeless tobacco: Never Used  . Alcohol Use: No   OB History    Gravida Para Term Preterm AB TAB SAB Ectopic Multiple Living   1 1 1       1      Review of Systems  All other systems reviewed and are negative.     Allergies  Tramadol; Sulfa antibiotics; Citalopram; and Lamotrigine  Home Medications   Prior to Admission medications   Medication Sig Start Date End Date Taking? Authorizing Provider  ALPRAZolam Duanne Moron) 0.5 MG tablet Take 0.5 mg by mouth 3 (three) times daily as needed for anxiety.    Historical Provider, MD  aspirin 325 MG tablet Take 325 mg by mouth daily as needed for mild pain.    Historical Provider, MD  clotrimazole-betamethasone (LOTRISONE) cream Apply 1 application topically 2 (two) times daily as needed  (irritation).  04/19/15   Historical Provider, MD  cyanocobalamin (,VITAMIN B-12,) 1000 MCG/ML injection Inject subcutaneously q Daily till 02/27/15,then, Inject subcutaneously q weekly for 4 weeks from 02/28/15, Then from 03/29/15-inject subcutaneously q monthly and stay on it Patient taking differently: Inject 1,000 mcg into the skin every 30 (thirty) days. Next injection due 06/29/15 02/23/15   Jonetta Osgood, MD  docusate sodium (COLACE) 100 MG capsule Take 1 capsule (100 mg total) by mouth every 12 (twelve) hours. Patient not taking: Reported on 05/17/2015 05/12/15   Donne Hazel, MD  folic acid (FOLVITE) 1 MG tablet Take 1 tablet (1 mg total) by mouth daily. 02/23/15   Shanker Kristeen Mans, MD  HYDROcodone-acetaminophen (NORCO) 5-325 MG per tablet Take 1 tablet by mouth every 6 (six) hours as needed. Patient not taking: Reported on 07/14/2015 06/26/15   Tatyana Kirichenko, PA-C  lurasidone (LATUDA) 40 MG TABS tablet Take 40 mg by mouth daily with breakfast.    Historical Provider, MD  meloxicam (MOBIC) 15 MG tablet Take 15 mg by mouth daily.    Historical Provider, MD  nitrofurantoin (MACRODANTIN) 100 MG capsule Take 1 capsule (100 mg total) by mouth 2 (two) times daily. Patient not taking: Reported on 06/26/2015 06/15/15   Junius Creamer, NP  ondansetron (ZOFRAN) 4 MG tablet Take 1 tablet (  4 mg total) by mouth every 6 (six) hours. 04/22/15   Nicole Pisciotta, PA-C  PARoxetine (PAXIL-CR) 25 MG 24 hr tablet Take 1 tablet (25 mg total) by mouth daily. Patient not taking: Reported on 04/21/2015 02/23/15   Jonetta Osgood, MD  phenazopyridine (PYRIDIUM) 200 MG tablet Take 1 tablet (200 mg total) by mouth 3 (three) times daily with meals. Patient not taking: Reported on 07/14/2015 06/15/15   Junius Creamer, NP  polyethylene glycol [facility] / GLYCOLAX) packet Take 17 g by mouth daily as needed for mild constipation. 05/12/15   Donne Hazel, MD  saccharomyces boulardii (FLORASTOR) 250 MG capsule Take 1 capsule (250 mg total)  by mouth 2 (two) times daily. Patient not taking: Reported on 06/26/2015 05/19/15   Janece Canterbury, MD  Thiamine HCl (VITAMIN B-1) 250 MG tablet Take 250 mg by mouth daily.     Historical Provider, MD   BP 116/73 mmHg  Pulse 89  Temp(Src) 97.7 F (36.5 C) (Oral)  Resp 22  SpO2 100%  LMP 09/13/2014 Physical Exam  Constitutional: She is oriented to person, place, and time. She appears well-developed and well-nourished.  Cardiovascular: Normal rate.   Pulmonary/Chest: Effort normal and breath sounds normal.  Abdominal: Soft. Bowel sounds are normal.  Genitourinary:  Yellow discharge. No cmt  Musculoskeletal: Normal range of motion.  Neurological: She is alert and oriented to person, place, and time.  Skin: Skin is warm and dry.  Psychiatric: She has a normal mood and affect.  Nursing note and vitals reviewed.   ED Course  Procedures (including critical care time) Labs Review Labs Reviewed  WET PREP, GENITAL - Abnormal; Notable for the following:    WBC, Wet Prep HPF POC TOO NUMEROUS TO COUNT (*)    All other components within normal limits  URINALYSIS, ROUTINE W REFLEX MICROSCOPIC (NOT AT [facility]) - Abnormal; Notable for the following:    Color, Urine AMBER (*)    APPearance CLOUDY (*)    Hgb urine dipstick LARGE (*)    Bilirubin Urine SMALL (*)    Protein, ur 30 (*)    Nitrite POSITIVE (*)    Leukocytes, UA MODERATE (*)    All other components within normal limits  URINE MICROSCOPIC-ADD ON - Abnormal; Notable for the following:    Squamous Epithelial / LPF FEW (*)    Bacteria, UA MANY (*)    All other components within normal limits  URINE CULTURE  GC/CHLAMYDIA PROBE AMP ([place]) NOT AT [facility]    Imaging Review No results found. I have personally reviewed and evaluated these images and lab results as part of my medical decision-making.   EKG Interpretation None      MDM   Final diagnoses:  Cervicitis  UTI (lower urinary tract infection)    Std culture  sent. Pt treatment here. uti noted. Culture sent. Will treat with keflex   Glendell Docker, NP 07/15/15 [place], MD 07/16/15 [phone]

## 2015-07-17 LAB — GC/CHLAMYDIA PROBE AMP (~~LOC~~) NOT AT ARMC
Chlamydia: NEGATIVE
Neisseria Gonorrhea: NEGATIVE

## 2015-07-20 LAB — URINE CULTURE: Culture: >=100000

## 2015-07-21 ENCOUNTER — Telehealth (HOSPITAL_COMMUNITY): Payer: Self-pay

## 2015-07-21 NOTE — Telephone Encounter (Signed)
Post ED Visit - Positive Culture Follow-up  Culture report reviewed by antimicrobial stewardship pharmacist:  []  Heide Guile, Pharm.D., BCPS []  Alycia Rossetti, Pharm.D., BCPS []  [place], Pharm.D., BCPS, AAHIVP []  Legrand Como, Pharm.D., BCPS, AAHIVP [x]  [employer], Pharm.D. []  Milus Glazier, [place].D.  Positive urine culture, >/= 100,000 colonies -> Proteus Mirabilis & 50,000 colonies -> E Coli Treated with Cephalexin Chart reviewed by E. [place] PA "No further treatment needed"  Dortha Kern 07/21/2015, 11:27 AM

## 2015-08-02 ENCOUNTER — Emergency Department (HOSPITAL_COMMUNITY)
Admission: EM | Admit: 2015-08-02 | Discharge: 2015-08-02 | Disposition: A | Discharge status: Home / Self Care | Payer: Medicaid Other | Attending: Emergency Medicine | Admitting: Emergency Medicine

## 2015-08-02 ENCOUNTER — Encounter (HOSPITAL_COMMUNITY): Payer: Self-pay | Admitting: Emergency Medicine

## 2015-08-02 DIAGNOSIS — Z792 Long term (current) use of antibiotics: Secondary | ICD-10-CM | POA: Diagnosis not present

## 2015-08-02 DIAGNOSIS — R531 Weakness: Secondary | ICD-10-CM | POA: Diagnosis present

## 2015-08-02 DIAGNOSIS — R42 Dizziness and giddiness: Secondary | ICD-10-CM | POA: Diagnosis not present

## 2015-08-02 DIAGNOSIS — Z87440 Personal history of urinary (tract) infections: Secondary | ICD-10-CM | POA: Insufficient documentation

## 2015-08-02 DIAGNOSIS — N3000 Acute cystitis without hematuria: Secondary | ICD-10-CM | POA: Insufficient documentation

## 2015-08-02 DIAGNOSIS — Z79899 Other long term (current) drug therapy: Secondary | ICD-10-CM | POA: Insufficient documentation

## 2015-08-02 DIAGNOSIS — F319 Bipolar disorder, unspecified: Secondary | ICD-10-CM | POA: Diagnosis not present

## 2015-08-02 DIAGNOSIS — R5383 Other fatigue: Secondary | ICD-10-CM | POA: Diagnosis not present

## 2015-08-02 DIAGNOSIS — D531 Other megaloblastic anemias, not elsewhere classified: Secondary | ICD-10-CM | POA: Diagnosis not present

## 2015-08-02 DIAGNOSIS — Z8619 Personal history of other infectious and parasitic diseases: Secondary | ICD-10-CM | POA: Insufficient documentation

## 2015-08-02 DIAGNOSIS — E669 Obesity, unspecified: Secondary | ICD-10-CM | POA: Diagnosis not present

## 2015-08-02 LAB — BASIC METABOLIC PANEL
Anion gap: 10 (ref 5–15)
BUN: 12 mg/dL (ref 6–20)
CO2: 22 mmol/L (ref 22–32)
Calcium: 9.2 mg/dL (ref 8.9–10.3)
Chloride: 109 mmol/L (ref 101–111)
Creatinine, Ser: 0.86 mg/dL (ref 0.44–1.00)
GFR calc Af Amer: >60 mL/min (ref >60)
GFR calc non Af Amer: >60 mL/min (ref >60)
Glucose, Bld: 137 mg/dL — ABNORMAL HIGH (ref 65–99)
Potassium: 3.5 mmol/L (ref 3.5–5.1)
Sodium: 141 mmol/L (ref 135–145)

## 2015-08-02 LAB — CBC WITH DIFFERENTIAL/PLATELET
Basophils Absolute: 0 10*3/uL (ref 0.0–0.1)
Basophils Relative: 0 %
Eosinophils Absolute: 0 10*3/uL (ref 0.0–0.7)
Eosinophils Relative: 0 %
HCT: 43.6 % (ref 36.0–46.0)
Hemoglobin: 15.3 g/dL — ABNORMAL HIGH (ref 12.0–15.0)
Lymphocytes Relative: 19 %
Lymphs Abs: 1.7 10*3/uL (ref 0.7–4.0)
MCH: 30.7 pg (ref 26.0–34.0)
MCHC: 35.1 g/dL (ref 30.0–36.0)
MCV: 87.4 fL (ref 78.0–100.0)
Monocytes Absolute: 1 10*3/uL (ref 0.1–1.0)
Monocytes Relative: 11 %
Neutro Abs: 6.3 10*3/uL (ref 1.7–7.7)
Neutrophils Relative %: 70 %
Platelets: 145 10*3/uL — ABNORMAL LOW (ref 150–400)
RBC: 4.99 MIL/uL (ref 3.87–5.11)
RDW: 13.3 % (ref 11.5–15.5)
WBC: 9 10*3/uL (ref 4.0–10.5)

## 2015-08-02 LAB — POC URINE PREG, ED: Preg Test, Ur: NEGATIVE

## 2015-08-02 LAB — URINE MICROSCOPIC-ADD ON

## 2015-08-02 LAB — URINALYSIS, ROUTINE W REFLEX MICROSCOPIC
Glucose, UA: NEGATIVE mg/dL
Ketones, ur: 15 mg/dL — AB
Nitrite: POSITIVE — AB
Protein, ur: 30 mg/dL — AB
Specific Gravity, Urine: 1.026 (ref 1.005–1.030)
Urobilinogen, UA: 1 mg/dL (ref 0.0–1.0)
pH: 5.5 (ref 5.0–8.0)

## 2015-08-02 MED ORDER — NITROFURANTOIN MONOHYD MACRO 100 MG PO CAPS: 100.0000 mg | ORAL_CAPSULE | Freq: Two times a day (BID) | ORAL | Status: DC

## 2015-08-02 MED ORDER — SODIUM CHLORIDE 0.9 % IV BOLUS (SEPSIS): 1000.0000 mL | Freq: Once | INTRAVENOUS | Status: DC

## 2015-08-02 MED ORDER — NITROFURANTOIN MONOHYD MACRO 100 MG PO CAPS
100.0000 mg | ORAL_CAPSULE | Freq: Once | ORAL | Status: AC
Start: 2015-08-02 — End: 2015-08-02
  Administered 2015-08-02: 100 mg via ORAL
  Filled 2015-08-02: qty 1

## 2015-08-02 NOTE — Discharge Instructions (Signed)

## 2015-08-02 NOTE — ED Notes (Signed)
Pt ambulated while holding on to visitor approx 40 steps before stopping stating she was too dizzy to continue.  Pt took steady steps with staff standby assist.

## 2015-08-02 NOTE — ED Provider Notes (Signed)
CSN: [id number]     Arrival date & time 08/02/15  1625 History   First MD Initiated Contact with Patient 08/02/15 1638     No chief complaint on file.  Jordan [name] is a 49 y.o. female with a past medical history significant for bipolar disorder and alcohol abuse who presents with a 2 day history of lightheadedness, fatigue, and change in urine color. The patient reports that she has not had any fevers, chills, chest pain, shortness of breath, cough, nausea, vomiting, constipation, diarrhea, dysuria. The patient reports that her urine has looked somewhat darker than normal. The patient does report a history of urinary tract infections. The patient is here with her boyfriend and she says she does not have any other complaints today. The patient denies any numbness, tingling, weakness in any extremity. The patient denies any headaches, vision changes, or head injuries. The patient's significant other reports that she does not drink many fluids and is to drink sodas. He thinks she may be dehydrated.   (Consider location/radiation/quality/duration/timing/severity/associated sxs/prior Treatment) Patient is a 49 y.o. female presenting with dizziness. The history is provided by the patient and medical records. No language interpreter was used.  Dizziness Quality:  Lightheadedness Severity:  Moderate Onset quality:  Gradual Duration:  3 days Timing:  Constant Progression:  Waxing and waning Chronicity:  Recurrent Context: not with loss of consciousness   Relieved by:  Nothing Worsened by:  Nothing Ineffective treatments:  None tried Associated symptoms: weakness (generalized)   Associated symptoms: no blood in stool, no chest pain, no diarrhea, no headaches, no nausea, no palpitations, no shortness of breath, no syncope and no vomiting   Weakness:    Severity:  Mild   Duration:  2 days   Onset quality:  Gradual   Timing:  Constant   Progression:  Waxing and waning   Chronicity:  New Risk  factors: no hx of stroke     Past Medical History  Diagnosis Date  . Mental disorder   . Bipolar 1 disorder   . Bipolar 1 disorder   . Yeast infection of the vagina   . Bipolar disorder   . Obesity   . UTI (lower urinary tract infection)   . Alcohol abuse   . Shortness of breath dyspnea   . Anemia   . 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 Cholecystectomy     No family history on file. Social History  Substance Use Topics  . Smoking status: Never Smoker   . Smokeless tobacco: Never Used  . Alcohol Use: No   OB History    Gravida Para Term Preterm AB TAB SAB Ectopic Multiple Living   1 1 1       1      Review of Systems  Constitutional: Negative for fever and chills.  HENT: Negative for congestion.   Respiratory: Negative for chest tightness, shortness of breath and wheezing.   Cardiovascular: Negative for chest pain, palpitations and syncope.  Gastrointestinal: Negative for nausea, vomiting, diarrhea and blood in stool.  Genitourinary: Negative for dysuria (Change in urine color), urgency, flank pain, decreased urine volume, vaginal bleeding, vaginal discharge and pelvic pain.  Musculoskeletal: Negative for back pain, neck pain and neck stiffness.  Skin: Negative for rash and wound.  Neurological: Positive for dizziness, weakness (generalized) and light-headedness. Negative for syncope, numbness and headaches.  Psychiatric/Behavioral: Negative for agitation.  All other systems reviewed and are  negative.     Allergies  Tramadol; Sulfa antibiotics; Citalopram; and Lamotrigine  Home Medications   Prior to Admission medications   Medication Sig Start Date End Date Taking? Authorizing Provider  ALPRAZolam Duanne Moron) 0.5 MG tablet Take 0.5 mg by mouth 3 (three) times daily as needed for anxiety.    Historical Provider, MD  aspirin 325 MG tablet Take 325 mg by mouth daily as needed for mild pain.     Historical Provider, MD  cephALEXin (KEFLEX) 500 MG capsule Take 1 capsule (500 mg total) by mouth 2 (two) times daily. 07/15/15   Glendell Docker, NP  clotrimazole-betamethasone (LOTRISONE) cream Apply 1 application topically 2 (two) times daily as needed (irritation).  04/19/15   Historical Provider, MD  cyanocobalamin (,VITAMIN B-12,) 1000 MCG/ML injection Inject subcutaneously q Daily till 02/27/15,then, Inject subcutaneously q weekly for 4 weeks from 02/28/15, Then from 03/29/15-inject subcutaneously q monthly and stay on it Patient taking differently: Inject 1,000 mcg into the skin every 30 (thirty) days.  02/23/15   Shanker Kristeen Mans, MD  docusate sodium (COLACE) 100 MG capsule Take 1 capsule (100 mg total) by mouth every 12 (twelve) hours. Patient not taking: Reported on 05/17/2015 05/12/15   Donne Hazel, MD  folic acid (FOLVITE) 1 MG tablet Take 1 tablet (1 mg total) by mouth daily. 02/23/15   Shanker Kristeen Mans, MD  HYDROcodone-acetaminophen (NORCO) 5-325 MG per tablet Take 1 tablet by mouth every 6 (six) hours as needed. Patient not taking: Reported on 07/14/2015 06/26/15   Tatyana Kirichenko, PA-C  lurasidone (LATUDA) 40 MG TABS tablet Take 40 mg by mouth daily with breakfast.    Historical Provider, MD  meloxicam (MOBIC) 15 MG tablet Take 15 mg by mouth daily.    Historical Provider, MD  nitrofurantoin (MACRODANTIN) 100 MG capsule Take 1 capsule (100 mg total) by mouth 2 (two) times daily. Patient not taking: Reported on 06/26/2015 06/15/15   Junius Creamer, NP  ondansetron (ZOFRAN) 4 MG tablet Take 1 tablet (4 mg total) by mouth every 6 (six) hours. 04/22/15   Nicole Pisciotta, PA-C  PARoxetine (PAXIL-CR) 25 MG 24 hr tablet Take 1 tablet (25 mg total) by mouth daily. Patient not taking: Reported on 04/21/2015 02/23/15   Jonetta Osgood, MD  phenazopyridine (PYRIDIUM) 200 MG tablet Take 1 tablet (200 mg total) by mouth 3 (three) times daily with meals. Patient not taking: Reported on 07/14/2015 06/15/15    Junius Creamer, NP  polyethylene glycol [facility] / GLYCOLAX) packet Take 17 g by mouth daily as needed for mild constipation. 05/12/15   Donne Hazel, MD  saccharomyces boulardii (FLORASTOR) 250 MG capsule Take 1 capsule (250 mg total) by mouth 2 (two) times daily. Patient not taking: Reported on 06/26/2015 05/19/15   Janece Canterbury, MD  Thiamine HCl (VITAMIN B-1) 250 MG tablet Take 250 mg by mouth daily.     Historical Provider, MD   LMP 09/13/2014 Physical Exam  Constitutional: She is oriented to person, place, and time. She appears well-developed and well-nourished. No distress.  HENT:  Head: Normocephalic and atraumatic.  Mouth/Throat: No oropharyngeal exudate.  Eyes: Conjunctivae and EOM are normal. Pupils are equal, round, and reactive to light.  Neck: Normal range of motion.  Cardiovascular: Normal rate, regular rhythm, normal heart sounds and intact distal pulses.   No murmur heard. Pulmonary/Chest: Effort normal. No stridor. No respiratory distress. She has no wheezes. She exhibits no tenderness.  Abdominal: Soft. She exhibits no distension. There is no tenderness. There  is no rebound.  Musculoskeletal: She exhibits no tenderness.  Neurological: She is alert and oriented to person, place, and time. She displays normal reflexes. No cranial nerve deficit. She exhibits normal muscle tone. Coordination normal.  Skin: Skin is warm. She is not diaphoretic. No erythema. No pallor.  Nursing note and vitals reviewed.   ED Course  Procedures (including critical care time) Labs Review Labs Reviewed  URINALYSIS, ROUTINE W REFLEX MICROSCOPIC (NOT AT [facility]) - Abnormal; Notable for the following:    Color, Urine ORANGE (*)    APPearance TURBID (*)    Hgb urine dipstick LARGE (*)    Bilirubin Urine SMALL (*)    Ketones, ur 15 (*)    Protein, ur 30 (*)    Nitrite POSITIVE (*)    Leukocytes, UA LARGE (*)    All other components within normal limits  CBC WITH DIFFERENTIAL/PLATELET - Abnormal;  Notable for the following:    Hemoglobin 15.3 (*)    Platelets 145 (*)    All other components within normal limits  BASIC METABOLIC PANEL - Abnormal; Notable for the following:    Glucose, Bld 137 (*)    All other components within normal limits  URINE MICROSCOPIC-ADD ON - Abnormal; Notable for the following:    Squamous Epithelial / LPF FEW (*)    Bacteria, UA MANY (*)    All other components within normal limits  URINE CULTURE  POC URINE PREG, ED    Imaging Review No results found. I have personally reviewed and evaluated these images and lab results as part of my medical decision-making.   EKG Interpretation   Date/Time:  Wednesday August 02 2015 17:29:40 EDT Ventricular Rate:  106 PR Interval:  170 QRS Duration: 70 QT Interval:  327 QTC Calculation: 434 R Axis:   58 Text Interpretation:  Sinus tachycardia No significant change since last  tracing Confirmed by LIU MD, Hinton Dyer [id number]) on 08/02/2015 5:38:26 PM      MDM   Almadelia Looman is a 49 y.o. female with a past medical history significant for bipolar disorder and alcohol abuse who presents with a 2 day history of lightheadedness, fatigue, and change in urine color. The patient's significant other reports that the patient may be dehydrated as she does not drink many fluids. Given the patient's reported lightheadedness and fatigue and urine change, suspect possible urinary tract infection. The patient says she does not feel that this is going on currently however she understands our concerns for this. The patient also agrees that she may not have been is hydrated.  The patient will have laboratory testing performed as well as rehydration with IV fluids. The patient's exam was grossly unremarkable for any abnormalities on neurologic exam, abdominal exam, or lung exam.  The patient's EKG did not show any acute abnormality aside from sinus tachycardia. There is no finding suggestive of ischemia. The patient was not having any  chest pain. The patient down here to have an arrhythmia which would've caused her lightheadedness.   The patient's urinalysis revealed a urinary tract infection. There were many bacteria and nitrites and leukocytes. The patient likely does not have systemic infection as her white blood cell count is nonelevated. The patient's BMP was unremarkable.  The patient was given a dose of Macrobid while in the emergency department and will be given a prescription for discharge. The patient felt better after by mouth liquids and food in the ED. The patient a longer felt lightheaded.  The patient was given  instructions to see her PCP in the next several days for follow-up from her ED visit with diagnosis of urinary tract infection. The patient was given return precautions for signs or symptoms of pyelonephritis or worsening infection. The patient and her boyfriend voiced understanding the plan of care, the patient had no other questions, concerns, or complaints and the patient was discharged in good condition.  This patient was seen with Dr. Oleta Mouse, emergency medicine attending.   Final diagnoses:  Acute cystitis without hematuria      Antony Blackbird, MD 08/03/15 2549  Forde Dandy, MD 08/03/15 0130

## 2015-08-02 NOTE — ED Notes (Signed)
Pt from home via [facility] with c/o weakness and dizziness while walking x 2 days.  Pt in NAD, A&O.

## 2015-08-03 ENCOUNTER — Telehealth (HOSPITAL_COMMUNITY): Payer: Self-pay

## 2015-08-03 NOTE — Telephone Encounter (Signed)
Pharmacy calling for [facility] # of prescribing Resident C. Tegeler.  Unable to provide DEA # for him.  Provided pharmacy with supervising MD name Dr Tyrone Schimke, DEA # and NPI #.

## 2015-08-04 LAB — URINE CULTURE

## 2015-08-06 ENCOUNTER — Encounter (HOSPITAL_COMMUNITY): Payer: Self-pay | Admitting: Emergency Medicine

## 2015-08-06 ENCOUNTER — Emergency Department (HOSPITAL_COMMUNITY)
Admission: EM | Admit: 2015-08-06 | Discharge: 2015-08-06 | Disposition: A | Discharge status: Home / Self Care | Payer: Medicaid Other | Attending: Emergency Medicine | Admitting: Emergency Medicine

## 2015-08-06 DIAGNOSIS — N390 Urinary tract infection, site not specified: Secondary | ICD-10-CM | POA: Diagnosis not present

## 2015-08-06 DIAGNOSIS — Z7982 Long term (current) use of aspirin: Secondary | ICD-10-CM | POA: Insufficient documentation

## 2015-08-06 DIAGNOSIS — F419 Anxiety disorder, unspecified: Secondary | ICD-10-CM | POA: Diagnosis not present

## 2015-08-06 DIAGNOSIS — Z8619 Personal history of other infectious and parasitic diseases: Secondary | ICD-10-CM | POA: Diagnosis not present

## 2015-08-06 DIAGNOSIS — D649 Anemia, unspecified: Secondary | ICD-10-CM | POA: Diagnosis not present

## 2015-08-06 DIAGNOSIS — R531 Weakness: Secondary | ICD-10-CM | POA: Insufficient documentation

## 2015-08-06 DIAGNOSIS — Z791 Long term (current) use of non-steroidal anti-inflammatories (NSAID): Secondary | ICD-10-CM | POA: Insufficient documentation

## 2015-08-06 DIAGNOSIS — R000 Tachycardia, unspecified: Secondary | ICD-10-CM | POA: Insufficient documentation

## 2015-08-06 DIAGNOSIS — R42 Dizziness and giddiness: Secondary | ICD-10-CM | POA: Insufficient documentation

## 2015-08-06 DIAGNOSIS — Z792 Long term (current) use of antibiotics: Secondary | ICD-10-CM | POA: Diagnosis not present

## 2015-08-06 DIAGNOSIS — E669 Obesity, unspecified: Secondary | ICD-10-CM | POA: Insufficient documentation

## 2015-08-06 DIAGNOSIS — Z79899 Other long term (current) drug therapy: Secondary | ICD-10-CM | POA: Diagnosis not present

## 2015-08-06 DIAGNOSIS — R110 Nausea: Secondary | ICD-10-CM | POA: Diagnosis present

## 2015-08-06 DIAGNOSIS — F319 Bipolar disorder, unspecified: Secondary | ICD-10-CM | POA: Diagnosis not present

## 2015-08-06 LAB — CBC
HCT: 44.7 % (ref 36.0–46.0)
Hemoglobin: 15.2 g/dL — ABNORMAL HIGH (ref 12.0–15.0)
MCH: 30.4 pg (ref 26.0–34.0)
MCHC: 34 g/dL (ref 30.0–36.0)
MCV: 89.4 fL (ref 78.0–100.0)
Platelets: 163 10*3/uL (ref 150–400)
RBC: 5 MIL/uL (ref 3.87–5.11)
RDW: 13.3 % (ref 11.5–15.5)
WBC: 5.3 10*3/uL (ref 4.0–10.5)

## 2015-08-06 LAB — COMPREHENSIVE METABOLIC PANEL
ALT: 18 U/L (ref 14–54)
AST: 23 U/L (ref 15–41)
Albumin: 3.9 g/dL (ref 3.5–5.0)
Alkaline Phosphatase: 92 U/L (ref 38–126)
Anion gap: 8 (ref 5–15)
BUN: 14 mg/dL (ref 6–20)
CO2: 23 mmol/L (ref 22–32)
Calcium: 9.2 mg/dL (ref 8.9–10.3)
Chloride: 113 mmol/L — ABNORMAL HIGH (ref 101–111)
Creatinine, Ser: 0.72 mg/dL (ref 0.44–1.00)
GFR calc Af Amer: >60 mL/min (ref >60)
GFR calc non Af Amer: >60 mL/min (ref >60)
Glucose, Bld: 114 mg/dL — ABNORMAL HIGH (ref 65–99)
Potassium: 3.6 mmol/L (ref 3.5–5.1)
Sodium: 144 mmol/L (ref 135–145)
Total Bilirubin: 2.1 mg/dL — ABNORMAL HIGH (ref 0.3–1.2)
Total Protein: 7.1 g/dL (ref 6.5–8.1)

## 2015-08-06 LAB — URINALYSIS, ROUTINE W REFLEX MICROSCOPIC
Glucose, UA: NEGATIVE mg/dL
Ketones, ur: 15 mg/dL — AB
Nitrite: POSITIVE — AB
Protein, ur: 30 mg/dL — AB
Specific Gravity, Urine: 1.028 (ref 1.005–1.030)
Urobilinogen, UA: 1 mg/dL (ref 0.0–1.0)
pH: 5.5 (ref 5.0–8.0)

## 2015-08-06 LAB — URINE MICROSCOPIC-ADD ON

## 2015-08-06 LAB — LIPASE, BLOOD: Lipase: 23 U/L (ref 22–51)

## 2015-08-06 MED ORDER — CEPHALEXIN 500 MG PO CAPS: 500.0000 mg | ORAL_CAPSULE | Freq: Four times a day (QID) | ORAL | Status: DC

## 2015-08-06 MED ORDER — METOCLOPRAMIDE HCL 5 MG/ML IJ SOLN
5.0000 mg | Freq: Once | INTRAMUSCULAR | Status: AC
  Administered 2015-08-06: 5 mg via INTRAVENOUS
  Filled 2015-08-06: qty 2

## 2015-08-06 MED ORDER — SODIUM CHLORIDE 0.9 % IV SOLN
INTRAVENOUS | Status: DC
  Administered 2015-08-06: 20:00:00 via INTRAVENOUS

## 2015-08-06 MED ORDER — DEXTROSE 5 % IV SOLN
1.0000 g | INTRAVENOUS | Status: DC
  Administered 2015-08-06: 1 g via INTRAVENOUS
  Filled 2015-08-06: qty 10

## 2015-08-06 MED ORDER — LORAZEPAM 2 MG/ML IJ SOLN
1.0000 mg | Freq: Once | INTRAMUSCULAR | Status: AC
  Administered 2015-08-06: 1 mg via INTRAVENOUS
  Filled 2015-08-06: qty 1

## 2015-08-06 MED ORDER — ONDANSETRON HCL 4 MG/2ML IJ SOLN
4.0000 mg | Freq: Once | INTRAMUSCULAR | Status: AC
  Administered 2015-08-06: 4 mg via INTRAVENOUS
  Filled 2015-08-06: qty 2

## 2015-08-06 MED ORDER — DIPHENHYDRAMINE HCL 50 MG/ML IJ SOLN
25.0000 mg | Freq: Once | INTRAMUSCULAR | Status: AC
  Administered 2015-08-06: 25 mg via INTRAVENOUS
  Filled 2015-08-06: qty 1

## 2015-08-06 MED ORDER — SODIUM CHLORIDE 0.9 % IV BOLUS (SEPSIS)
1000.0000 mL | Freq: Once | INTRAVENOUS | Status: AC
  Administered 2015-08-06: 1000 mL via INTRAVENOUS

## 2015-08-06 NOTE — ED Notes (Addendum)
Pt began taking Fluconazole, Benztropine, and xanax on 08/02/15, since then has been experiencing nausea, dizziness, and weakness. Per EMS, denies CP, SOB, vomiting/diarrhea.  Pt states she was diagnosed with a UTI 1 month prior, given prescription medication, states she's been taking the medication and says she's still taking it today. Believes she still has a UTI. Husband states since she's been on her new medication she's had occasional difficulty with speech. Patient able to speak clearly in full sentences during triage.

## 2015-08-06 NOTE — Discharge Instructions (Signed)
Continue on the Macrobid as directed. Follow-up with your doctor this weeks they can check your urine culture and adjust your antibiotics as needed  Urinary Tract Infection Urinary tract infections (UTIs) can develop anywhere along your urinary tract. Your urinary tract is your body's drainage system for removing wastes and extra water. Your urinary tract includes two kidneys, two ureters, a bladder, and a urethra. Your kidneys are a pair of bean-shaped organs. Each kidney is about the size of your fist. They are located below your ribs, one on each side of your spine. CAUSES Infections are caused by microbes, which are microscopic organisms, including fungi, viruses, and bacteria. These organisms are so small that they can only be seen through a microscope. Bacteria are the microbes that most commonly cause UTIs. SYMPTOMS  Symptoms of UTIs may vary by age and gender of the patient and by the location of the infection. Symptoms in young women typically include a frequent and intense urge to urinate and a painful, burning feeling in the bladder or urethra during urination. Older women and men are more likely to be tired, shaky, and weak and have muscle aches and abdominal pain. A fever may mean the infection is in your kidneys. Other symptoms of a kidney infection include pain in your back or sides below the ribs, nausea, and vomiting. DIAGNOSIS To diagnose a UTI, your caregiver will ask you about your symptoms. Your caregiver will also ask you to provide a urine sample. The urine sample will be tested for bacteria and white blood cells. White blood cells are made by your body to help fight infection. TREATMENT  Typically, UTIs can be treated with medication. Because most UTIs are caused by a bacterial infection, they usually can be treated with the use of antibiotics. The choice of antibiotic and length of treatment depend on your symptoms and the type of bacteria causing your infection. HOME CARE  INSTRUCTIONS  If you were prescribed antibiotics, take them exactly as your caregiver instructs you. Finish the medication even if you feel better after you have only taken some of the medication.  Drink enough water and fluids to keep your urine clear or pale yellow.  Avoid caffeine, tea, and carbonated beverages. They tend to irritate your bladder.  Empty your bladder often. Avoid holding urine for long periods of time.  Empty your bladder before and after sexual intercourse.  After a bowel movement, women should cleanse from front to back. Use each tissue only once. SEEK MEDICAL CARE IF:   You have back pain.  You develop a fever.  Your symptoms do not begin to resolve within 3 days. SEEK IMMEDIATE MEDICAL CARE IF:   You have severe back pain or lower abdominal pain.  You develop chills.  You have nausea or vomiting.  You have continued burning or discomfort with urination. MAKE SURE YOU:   Understand these instructions.  Will watch your condition.  Will get help right away if you are not doing well or get worse.   This information is not intended to replace advice given to you by your health care provider. Make sure you discuss any questions you have with your health care provider.   Document Released: 07/17/2005 Document Revised: 06/28/2015 Document Reviewed: 11/15/2011 Elsevier Interactive Patient Education [employer].

## 2015-08-06 NOTE — ED Notes (Signed)
Pt is resting comfortably in bed with a sandwich, applesauce, and Sprite. No acute distress.

## 2015-08-06 NOTE — ED Provider Notes (Signed)
CSN: [id number]     Arrival date & time 08/06/15  1810 History   First MD Initiated Contact with Patient 08/06/15 1912     Chief Complaint  Patient presents with  . Dizziness  . Nausea  . Weakness     (Consider location/radiation/quality/duration/timing/severity/associated sxs/prior Treatment) HPI Comments: Patient here complaining of nausea, dizziness, weakness after she takes her medications. Was seen here 4 days ago for similar symptoms treated for UTI. States that when she takes her Xanax as he becomes dizzy and lightheaded. Denies any syncope or near-syncope. No associated chest pain, shortness of breath, diaphoresis. Denies any vomiting or diarrhea. Does endorse some anxiety. Has had multiple visits here in the past 6 months for a total of 19. Denies any fever. Symptoms persistent and nothing makes them better.  Patient is a 49 y.o. female presenting with dizziness and weakness. The history is provided by the patient and the spouse.  Dizziness Associated symptoms: weakness   Weakness    Past Medical History  Diagnosis Date  . Mental disorder   . Bipolar 1 disorder ([place])   . Bipolar 1 disorder ([place])   . Yeast infection of the vagina   . Bipolar disorder ([place])   . Obesity   . UTI (lower urinary tract infection)   . Alcohol abuse   . Shortness of breath dyspnea   . Anemia   . 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 Cholecystectomy     History reviewed. No pertinent family history. Social History  Substance Use Topics  . Smoking status: Never Smoker   . Smokeless tobacco: Never Used  . Alcohol Use: No   OB History    Gravida Para Term Preterm AB TAB SAB Ectopic Multiple Living   1 1 1       1      Review of Systems  Neurological: Positive for dizziness and weakness.  All other systems reviewed and are negative.     Allergies  Tramadol; Sulfa antibiotics; Citalopram; and  Lamotrigine  Home Medications   Prior to Admission medications   Medication Sig Start Date End Date Taking? Authorizing Provider  ALPRAZolam Duanne Moron) 0.5 MG tablet Take 0.5 mg by mouth 3 (three) times daily as needed for anxiety.    Historical Provider, MD  aspirin 325 MG tablet Take 325 mg by mouth daily as needed for mild pain.    Historical Provider, MD  cephALEXin (KEFLEX) 500 MG capsule Take 1 capsule (500 mg total) by mouth 2 (two) times daily. 07/15/15   Glendell Docker, NP  clotrimazole-betamethasone (LOTRISONE) cream Apply 1 application topically 2 (two) times daily as needed (irritation).  04/19/15   Historical Provider, MD  cyanocobalamin (,VITAMIN B-12,) 1000 MCG/ML injection Inject subcutaneously q Daily till 02/27/15,then, Inject subcutaneously q weekly for 4 weeks from 02/28/15, Then from 03/29/15-inject subcutaneously q monthly and stay on it Patient taking differently: Inject 1,000 mcg into the skin every 30 (thirty) days.  02/23/15   Shanker Kristeen Mans, MD  docusate sodium (COLACE) 100 MG capsule Take 1 capsule (100 mg total) by mouth every 12 (twelve) hours. Patient not taking: Reported on 05/17/2015 05/12/15   Donne Hazel, MD  folic acid (FOLVITE) 1 MG tablet Take 1 tablet (1 mg total) by mouth daily. 02/23/15   Shanker Kristeen Mans, MD  HYDROcodone-acetaminophen (NORCO) 5-325 MG per tablet Take 1 tablet by mouth every 6 (six) hours as needed. Patient not taking:  Reported on 07/14/2015 06/26/15   Tatyana Kirichenko, PA-C  lurasidone (LATUDA) 40 MG TABS tablet Take 40 mg by mouth daily with breakfast.    Historical Provider, MD  meloxicam (MOBIC) 15 MG tablet Take 15 mg by mouth daily.    Historical Provider, MD  nitrofurantoin (MACRODANTIN) 100 MG capsule Take 1 capsule (100 mg total) by mouth 2 (two) times daily. Patient not taking: Reported on 06/26/2015 06/15/15   Junius Creamer, NP  nitrofurantoin, macrocrystal-monohydrate, (MACROBID) 100 MG capsule Take 1 capsule (100 mg total) by mouth 2  (two) times daily. 08/02/15   Antony Blackbird, MD  ondansetron (ZOFRAN) 4 MG tablet Take 1 tablet (4 mg total) by mouth every 6 (six) hours. 04/22/15   Nicole Pisciotta, PA-C  PARoxetine (PAXIL-CR) 25 MG 24 hr tablet Take 1 tablet (25 mg total) by mouth daily. Patient not taking: Reported on 04/21/2015 02/23/15   Jonetta Osgood, MD  phenazopyridine (PYRIDIUM) 200 MG tablet Take 1 tablet (200 mg total) by mouth 3 (three) times daily with meals. Patient not taking: Reported on 07/14/2015 06/15/15   Junius Creamer, NP  polyethylene glycol [facility] / GLYCOLAX) packet Take 17 g by mouth daily as needed for mild constipation. 05/12/15   Donne Hazel, MD  saccharomyces boulardii (FLORASTOR) 250 MG capsule Take 1 capsule (250 mg total) by mouth 2 (two) times daily. Patient not taking: Reported on 06/26/2015 05/19/15   Janece Canterbury, MD  Thiamine HCl (VITAMIN B-1) 250 MG tablet Take 250 mg by mouth daily.     Historical Provider, MD   BP 107/58 mmHg  Pulse 111  Temp(Src) 98.1 F (36.7 C) (Oral)  Resp 20  SpO2 93%  LMP 09/13/2014 Physical Exam  Constitutional: She is oriented to person, place, and time. She appears well-developed and well-nourished.  Non-toxic appearance. No distress.  HENT:  Head: Normocephalic and atraumatic.  Eyes: Conjunctivae, EOM and lids are normal. Pupils are equal, round, and reactive to light.  Neck: Normal range of motion. Neck supple. No tracheal deviation present. No thyroid mass present.  Cardiovascular: Regular rhythm and normal heart sounds.  Tachycardia present.  Exam reveals no gallop.   No murmur heard. Pulmonary/Chest: Effort normal and breath sounds normal. No stridor. No respiratory distress. She has no decreased breath sounds. She has no wheezes. She has no rhonchi. She has no rales.  Abdominal: Soft. Normal appearance and bowel sounds are normal. She exhibits no distension. There is no tenderness. There is no rebound and no CVA tenderness.  Musculoskeletal: Normal  range of motion. She exhibits no edema or tenderness.  Neurological: She is alert and oriented to person, place, and time. She has normal strength. No cranial nerve deficit or sensory deficit. GCS eye subscore is 4. GCS verbal subscore is 5. GCS motor subscore is 6.  Skin: Skin is warm and dry. No abrasion and no rash noted.  Psychiatric: She has a normal mood and affect. Her speech is normal and behavior is normal.  Nursing note and vitals reviewed.   ED Course  Procedures (including critical care time) Labs Review Labs Reviewed  CBC  URINALYSIS, ROUTINE W REFLEX MICROSCOPIC (NOT AT [facility])  COMPREHENSIVE METABOLIC PANEL  LIPASE, BLOOD    Imaging Review No results found. I have personally reviewed and evaluated these images and lab results as part of my medical decision-making.   EKG Interpretation None      MDM   Final diagnoses:  None    Pt given rocephin and pain meds, repeat  urine cx obtained due to it being contaminated, pt states will compliance with macrobid, will add keflex and pt will f/u her pcp    Lacretia Leigh, MD 08/06/15 2217

## 2015-08-08 LAB — URINE CULTURE

## 2015-08-10 ENCOUNTER — Encounter (HOSPITAL_COMMUNITY): Payer: Self-pay

## 2015-08-10 ENCOUNTER — Emergency Department (HOSPITAL_COMMUNITY)
Admission: EM | Admit: 2015-08-10 | Discharge: 2015-08-11 | Disposition: A | Discharge status: Home / Self Care | Payer: Medicaid Other | Attending: Emergency Medicine | Admitting: Emergency Medicine

## 2015-08-10 DIAGNOSIS — E669 Obesity, unspecified: Secondary | ICD-10-CM | POA: Diagnosis not present

## 2015-08-10 DIAGNOSIS — Y998 Other external cause status: Secondary | ICD-10-CM | POA: Diagnosis not present

## 2015-08-10 DIAGNOSIS — Y9301 Activity, walking, marching and hiking: Secondary | ICD-10-CM | POA: Insufficient documentation

## 2015-08-10 DIAGNOSIS — Z79899 Other long term (current) drug therapy: Secondary | ICD-10-CM | POA: Insufficient documentation

## 2015-08-10 DIAGNOSIS — Z791 Long term (current) use of non-steroidal anti-inflammatories (NSAID): Secondary | ICD-10-CM | POA: Diagnosis not present

## 2015-08-10 DIAGNOSIS — F319 Bipolar disorder, unspecified: Secondary | ICD-10-CM | POA: Diagnosis not present

## 2015-08-10 DIAGNOSIS — W19XXXA Unspecified fall, initial encounter: Secondary | ICD-10-CM

## 2015-08-10 DIAGNOSIS — M545 Low back pain, unspecified: Secondary | ICD-10-CM

## 2015-08-10 DIAGNOSIS — Z87440 Personal history of urinary (tract) infections: Secondary | ICD-10-CM | POA: Insufficient documentation

## 2015-08-10 DIAGNOSIS — Z8619 Personal history of other infectious and parasitic diseases: Secondary | ICD-10-CM | POA: Diagnosis not present

## 2015-08-10 DIAGNOSIS — W501XXA Accidental kick by another person, initial encounter: Secondary | ICD-10-CM | POA: Diagnosis not present

## 2015-08-10 DIAGNOSIS — S3992XA Unspecified injury of lower back, initial encounter: Secondary | ICD-10-CM | POA: Insufficient documentation

## 2015-08-10 DIAGNOSIS — R42 Dizziness and giddiness: Secondary | ICD-10-CM | POA: Diagnosis not present

## 2015-08-10 DIAGNOSIS — D649 Anemia, unspecified: Secondary | ICD-10-CM | POA: Insufficient documentation

## 2015-08-10 DIAGNOSIS — Y92231 Patient bathroom in hospital as the place of occurrence of the external cause: Secondary | ICD-10-CM | POA: Diagnosis not present

## 2015-08-10 DIAGNOSIS — Z792 Long term (current) use of antibiotics: Secondary | ICD-10-CM | POA: Insufficient documentation

## 2015-08-10 LAB — URINALYSIS, ROUTINE W REFLEX MICROSCOPIC
Glucose, UA: NEGATIVE mg/dL
Hgb urine dipstick: NEGATIVE
Ketones, ur: 15 mg/dL — AB
Nitrite: NEGATIVE
Protein, ur: NEGATIVE mg/dL
Specific Gravity, Urine: 1.033 — ABNORMAL HIGH (ref 1.005–1.030)
Urobilinogen, UA: 1 mg/dL (ref 0.0–1.0)
pH: 6 (ref 5.0–8.0)

## 2015-08-10 LAB — CBC
HCT: 44.6 % (ref 36.0–46.0)
Hemoglobin: 16 g/dL — ABNORMAL HIGH (ref 12.0–15.0)
MCH: 30.8 pg (ref 26.0–34.0)
MCHC: 35.9 g/dL (ref 30.0–36.0)
MCV: 85.8 fL (ref 78.0–100.0)
Platelets: 189 10*3/uL (ref 150–400)
RBC: 5.2 MIL/uL — ABNORMAL HIGH (ref 3.87–5.11)
RDW: 12.8 % (ref 11.5–15.5)
WBC: 6.7 10*3/uL (ref 4.0–10.5)

## 2015-08-10 LAB — URINE MICROSCOPIC-ADD ON

## 2015-08-10 LAB — BASIC METABOLIC PANEL
Anion gap: 9 (ref 5–15)
BUN: 11 mg/dL (ref 6–20)
CO2: 21 mmol/L — ABNORMAL LOW (ref 22–32)
Calcium: 9.5 mg/dL (ref 8.9–10.3)
Chloride: 112 mmol/L — ABNORMAL HIGH (ref 101–111)
Creatinine, Ser: 0.6 mg/dL (ref 0.44–1.00)
GFR calc Af Amer: >60 mL/min (ref >60)
GFR calc non Af Amer: >60 mL/min (ref >60)
Glucose, Bld: 105 mg/dL — ABNORMAL HIGH (ref 65–99)
Potassium: 3.7 mmol/L (ref 3.5–5.1)
Sodium: 142 mmol/L (ref 135–145)

## 2015-08-10 MED ORDER — SODIUM CHLORIDE 0.9 % IV BOLUS (SEPSIS): 500.0000 mL | Freq: Once | INTRAVENOUS | Status: DC

## 2015-08-10 NOTE — ED Notes (Signed)
Per EMS-has been on antibiotic for UTI-was treated on the 16th-patient is on a lot benzo's-patient states she is "dizzy"

## 2015-08-10 NOTE — Discharge Instructions (Signed)
Dizziness Dizziness is a common problem. It is a feeling of unsteadiness or light-headedness. You may feel like you are about to faint. Dizziness can lead to injury if you stumble or fall. Anyone can become dizzy, but dizziness is more common in older adults. This condition can be caused by a number of things, including medicines, dehydration, or illness. HOME CARE INSTRUCTIONS Taking these steps may help with your condition: Eating and Drinking  Drink enough fluid to keep your urine clear or pale yellow. This helps to keep you from becoming dehydrated. Try to drink more clear fluids, such as water.  Do not drink alcohol.  Limit your caffeine intake if directed by your health care provider.  Limit your salt intake if directed by your health care provider. Activity  Avoid making quick movements.  Rise slowly from chairs and steady yourself until you feel okay.  In the morning, first sit up on the side of the bed. When you feel okay, stand slowly while you hold onto something until you know that your balance is fine.  Move your legs often if you need to stand in one place for a long time. Tighten and relax your muscles in your legs while you are standing.  Do not drive or operate heavy machinery if you feel dizzy.  Avoid bending down if you feel dizzy. Place items in your home so that they are easy for you to reach without leaning over. Lifestyle  Do not use any tobacco products, including cigarettes, chewing tobacco, or electronic cigarettes. If you need help quitting, ask your health care provider.  Try to reduce your stress level, such as with yoga or meditation. Talk with your health care provider if you need help. General Instructions  Watch your dizziness for any changes.  Take medicines only as directed by your health care provider. Talk with your health care provider if you think that your dizziness is caused by a medicine that you are taking.  Tell a friend or a family  member that you are feeling dizzy. If he or she notices any changes in your behavior, have this person call your health care provider.  Keep all follow-up visits as directed by your health care provider. This is important. SEEK MEDICAL CARE IF:  Your dizziness does not go away.  Your dizziness or light-headedness gets worse.  You feel nauseous.  You have reduced hearing.  You have new symptoms.  You are unsteady on your feet or you feel like the room is spinning. SEEK IMMEDIATE MEDICAL CARE IF:  You vomit or have diarrhea and are unable to eat or drink anything.  You have problems talking, walking, swallowing, or using your arms, hands, or legs.  You feel generally weak.  You are not thinking clearly or you have trouble forming sentences. It may take a friend or family member to notice this.  You have chest pain, abdominal pain, shortness of breath, or sweating.  Your vision changes.  You notice any bleeding.  You have a headache.  You have neck pain or a stiff neck.  You have a fever.   This information is not intended to replace advice given to you by your health care provider. Make sure you discuss any questions you have with your health care provider.   Document Released: 04/02/2001 Document Revised: 02/21/2015 Document Reviewed: 10/03/2014 Elsevier Interactive Patient Education 2016 Elsevier Inc.  Back Pain, Adult Back pain is very common in adults.The cause of back pain is rarely dangerous and  the pain often gets better over time.The cause of your back pain may not be known. Some common causes of back pain include:  Strain of the muscles or ligaments supporting the spine.  Wear and tear (degeneration) of the spinal disks.  Arthritis.  Direct injury to the back. For many people, back pain may return. Since back pain is rarely dangerous, most people can learn to manage this condition on their own. HOME CARE INSTRUCTIONS Watch your back pain for any  changes. The following actions may help to lessen any discomfort you are feeling:  Remain active. It is stressful on your back to sit or stand in one place for long periods of time. Do not sit, drive, or stand in one place for more than 30 minutes at a time. Take short walks on even surfaces as soon as you are able.Try to increase the length of time you walk each day.  Exercise regularly as directed by your health care provider. Exercise helps your back heal faster. It also helps avoid future injury by keeping your muscles strong and flexible.  Do not stay in bed.Resting more than 1-2 days can delay your recovery.  Pay attention to your body when you bend and lift. The most comfortable positions are those that put less stress on your recovering back. Always use proper lifting techniques, including:  Bending your knees.  Keeping the load close to your body.  Avoiding twisting.  Find a comfortable position to sleep. Use a firm mattress and lie on your side with your knees slightly bent. If you lie on your back, put a pillow under your knees.  Avoid feeling anxious or stressed.Stress increases muscle tension and can worsen back pain.It is important to recognize when you are anxious or stressed and learn ways to manage it, such as with exercise.  Take medicines only as directed by your health care provider. Over-the-counter medicines to reduce pain and inflammation are often the most helpful.Your health care provider may prescribe muscle relaxant drugs.These medicines help dull your pain so you can more quickly return to your normal activities and healthy exercise.  Apply ice to the injured area:  Put ice in a plastic bag.  Place a towel between your skin and the bag.  Leave the ice on for 20 minutes, 2-3 times a day for the first 2-3 days. After that, ice and heat may be alternated to reduce pain and spasms.  Maintain a healthy weight. Excess weight puts extra stress on your back and  makes it difficult to maintain good posture. SEEK MEDICAL CARE IF:  You have pain that is not relieved with rest or medicine.  You have increasing pain going down into the legs or buttocks.  You have pain that does not improve in one week.  You have night pain.  You lose weight.  You have a fever or chills. SEEK IMMEDIATE MEDICAL CARE IF:   You develop new bowel or bladder control problems.  You have unusual weakness or numbness in your arms or legs.  You develop nausea or vomiting.  You develop abdominal pain.  You feel faint.   This information is not intended to replace advice given to you by your health care provider. Make sure you discuss any questions you have with your health care provider.   Document Released: 10/07/2005 Document Revised: 10/28/2014 Document Reviewed: 02/08/2014 Elsevier Interactive Patient Education [employer].

## 2015-08-10 NOTE — ED Notes (Signed)
Pt c/o dizziness since being diagnosed w/ a uti x 4 days ago and frequent falls/unsteady gait x 6 months.  Denies pain.  Pt reports being seen by PCP x 1 week regarding gait and Pt's husband reports "they just upped her pills."
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
## 2015-08-10 NOTE — Progress Notes (Signed)
Patient has been noted to have visited the ED 14 times within the last six months with six admissions.  Patient recently admitted for dizziness/uti on 09/20 but patient left AMA.  Patient has a rolling walker at home.  Patient has supportive boyfriend at home.  Patient with psychiatric history of bipolar disorder.  Per prior conversation with patient, she receives out patient psychiatric services through her pcp office.  Patient's pcp located at [facility].  Patient has Medicaid [place] access insurance.  No further EDCM needs at this time.

## 2015-08-11 NOTE — ED Notes (Addendum)
Pt is a difficult IV stick. Delay in administering fluids

## 2015-08-14 ENCOUNTER — Emergency Department (HOSPITAL_COMMUNITY)
Admission: EM | Admit: 2015-08-14 | Discharge: 2015-08-15 | Disposition: A | Discharge status: Home / Self Care | Payer: Medicaid Other | Attending: Emergency Medicine | Admitting: Emergency Medicine

## 2015-08-14 ENCOUNTER — Emergency Department (HOSPITAL_COMMUNITY): Payer: Medicaid Other

## 2015-08-14 ENCOUNTER — Encounter (HOSPITAL_COMMUNITY): Payer: Self-pay | Admitting: Vascular Surgery

## 2015-08-14 DIAGNOSIS — Z87440 Personal history of urinary (tract) infections: Secondary | ICD-10-CM | POA: Diagnosis not present

## 2015-08-14 DIAGNOSIS — T43595A Adverse effect of other antipsychotics and neuroleptics, initial encounter: Secondary | ICD-10-CM | POA: Diagnosis not present

## 2015-08-14 DIAGNOSIS — Z79899 Other long term (current) drug therapy: Secondary | ICD-10-CM | POA: Insufficient documentation

## 2015-08-14 DIAGNOSIS — Z791 Long term (current) use of non-steroidal anti-inflammatories (NSAID): Secondary | ICD-10-CM | POA: Insufficient documentation

## 2015-08-14 DIAGNOSIS — Z8619 Personal history of other infectious and parasitic diseases: Secondary | ICD-10-CM | POA: Insufficient documentation

## 2015-08-14 DIAGNOSIS — F319 Bipolar disorder, unspecified: Secondary | ICD-10-CM | POA: Insufficient documentation

## 2015-08-14 DIAGNOSIS — T50905A Adverse effect of unspecified drugs, medicaments and biological substances, initial encounter: Secondary | ICD-10-CM

## 2015-08-14 DIAGNOSIS — D649 Anemia, unspecified: Secondary | ICD-10-CM | POA: Insufficient documentation

## 2015-08-14 DIAGNOSIS — R4781 Slurred speech: Secondary | ICD-10-CM | POA: Insufficient documentation

## 2015-08-14 DIAGNOSIS — E669 Obesity, unspecified: Secondary | ICD-10-CM | POA: Diagnosis not present

## 2015-08-14 DIAGNOSIS — R42 Dizziness and giddiness: Secondary | ICD-10-CM | POA: Diagnosis present

## 2015-08-14 DIAGNOSIS — Z792 Long term (current) use of antibiotics: Secondary | ICD-10-CM | POA: Insufficient documentation

## 2015-08-14 DIAGNOSIS — I951 Orthostatic hypotension: Secondary | ICD-10-CM

## 2015-08-14 LAB — URINE MICROSCOPIC-ADD ON

## 2015-08-14 LAB — URINALYSIS, ROUTINE W REFLEX MICROSCOPIC
Glucose, UA: NEGATIVE mg/dL
Hgb urine dipstick: NEGATIVE
Ketones, ur: 15 mg/dL — AB
Nitrite: NEGATIVE
Protein, ur: 30 mg/dL — AB
Specific Gravity, Urine: 1.024 (ref 1.005–1.030)
Urobilinogen, UA: 1 mg/dL (ref 0.0–1.0)
pH: 6 (ref 5.0–8.0)

## 2015-08-14 LAB — BASIC METABOLIC PANEL
Anion gap: 9 (ref 5–15)
BUN: 6 mg/dL (ref 6–20)
CO2: 22 mmol/L (ref 22–32)
Calcium: 9.5 mg/dL (ref 8.9–10.3)
Chloride: 112 mmol/L — ABNORMAL HIGH (ref 101–111)
Creatinine, Ser: 0.62 mg/dL (ref 0.44–1.00)
GFR calc Af Amer: >60 mL/min (ref >60)
GFR calc non Af Amer: >60 mL/min (ref >60)
Glucose, Bld: 117 mg/dL — ABNORMAL HIGH (ref 65–99)
Potassium: 3.2 mmol/L — ABNORMAL LOW (ref 3.5–5.1)
Sodium: 143 mmol/L (ref 135–145)

## 2015-08-14 LAB — CBC
HCT: 43.7 % (ref 36.0–46.0)
Hemoglobin: 15.3 g/dL — ABNORMAL HIGH (ref 12.0–15.0)
MCH: 30.2 pg (ref 26.0–34.0)
MCHC: 35 g/dL (ref 30.0–36.0)
MCV: 86.4 fL (ref 78.0–100.0)
Platelets: 185 10*3/uL (ref 150–400)
RBC: 5.06 MIL/uL (ref 3.87–5.11)
RDW: 13.1 % (ref 11.5–15.5)
WBC: 6.4 10*3/uL (ref 4.0–10.5)

## 2015-08-14 MED ORDER — POTASSIUM CHLORIDE CRYS ER 20 MEQ PO TBCR
40.0000 meq | EXTENDED_RELEASE_TABLET | Freq: Once | ORAL | Status: AC
  Administered 2015-08-15: 40 meq via ORAL
  Filled 2015-08-14: qty 2

## 2015-08-14 NOTE — ED Notes (Signed)
Patient transported to X-ray 

## 2015-08-14 NOTE — ED Provider Notes (Signed)
CSN: [id number]   Arrival date & time 08/14/15 1804  History  By signing my name below, I, Altamease Oiler, attest that this documentation has been prepared under the direction and in the presence of Scarleth Brame, MD. Electronically Signed: Altamease Oiler, ED Scribe. 08/14/2015. 11:28 PM.  Chief Complaint  Patient presents with  . Dizziness    HPI Patient is a 49 y.o. female presenting with dizziness. The history is provided by the patient and the spouse. No language interpreter was used.  Dizziness Quality:  Room spinning Severity:  Moderate Onset quality:  Gradual Timing:  Intermittent Progression:  Unchanged Chronicity:  Recurrent Relieved by:  Nothing Worsened by:  Nothing Associated symptoms: no chest pain, no diarrhea, no nausea, no tinnitus, no vomiting and no weakness   Risk factors: multiple medications    Chanci Ojala is a 49 y.o. female with history of bipolar disorder,anemia, and alcohol abuse who presents to the Emergency Department complaining of intermittent room-spinning dizziness with onset weeks ago. The room seems to be spinning counter-clockwise. She associates the sensation with her bipolar medication. 4 days ago the pt stopped all medication except 40 mg of Latuda. She and her husband understand that [place] must be tapered down and they were instructed to lower the dose to 20 mg but have not been cutting the tablets in half. She went to her doctor's office today but left because she was not satisfied with the service. Associated symptoms include intermittent slurred speech. Pt denies tinnitus, nausea, vomiting, diarrhea. No recent alcohol use.   Past Medical History  Diagnosis Date  . Mental disorder   . Bipolar 1 disorder ([place])   . Bipolar 1 disorder ([place])   . Yeast infection of the vagina   . Bipolar disorder ([place])   . Obesity   . UTI (lower urinary tract infection)   . Alcohol abuse   . Shortness of breath dyspnea   . Anemia   . 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 Cholecystectomy      History reviewed. No pertinent family history.  Social History  Substance Use Topics  . Smoking status: Never Smoker   . Smokeless tobacco: Never Used  . Alcohol Use: No     Review of Systems  Constitutional: Negative for fever and chills.  HENT: Negative for tinnitus.   Cardiovascular: Negative for chest pain.  Gastrointestinal: Negative for nausea, vomiting and diarrhea.  Neurological: Positive for dizziness and speech difficulty. Negative for weakness.  All other systems reviewed and are negative.  Home Medications   Prior to Admission medications   Medication Sig Start Date End Date Taking? Authorizing Provider  ALPRAZolam Duanne Moron) 0.5 MG tablet Take 0.5 mg by mouth 3 (three) times daily.     Historical Provider, MD  aspirin 325 MG tablet Take 325 mg by mouth daily as needed for mild pain.    Historical Provider, MD  benztropine (COGENTIN) 1 MG tablet Take 1 mg by mouth 2 (two) times daily.    Historical Provider, MD  cephALEXin (KEFLEX) 500 MG capsule Take 1 capsule (500 mg total) by mouth 4 (four) times daily. 08/06/15   Lacretia Leigh, MD  cyanocobalamin (,VITAMIN B-12,) 1000 MCG/ML injection Inject subcutaneously q Daily till 02/27/15,then, Inject subcutaneously q weekly for 4 weeks from 02/28/15, Then from 03/29/15-inject subcutaneously q monthly and stay on it Patient taking differently: Inject 1,000 mcg into the skin every 30 (thirty) days.  02/23/15   Shanker Kristeen Mans, MD  docusate sodium (COLACE) 100 MG capsule Take 1 capsule (100 mg total) by mouth every 12 (twelve) hours. Patient not taking: Reported on 05/17/2015 05/12/15   Donne Hazel, MD  fluconazole (DIFLUCAN) 100 MG tablet Take 100 mg by mouth daily.    Historical Provider, MD  folic acid (FOLVITE) 1 MG tablet Take 1 tablet (1 mg total) by mouth daily. 02/23/15   Shanker Kristeen Mans, MD   HYDROcodone-acetaminophen (NORCO) 5-325 MG per tablet Take 1 tablet by mouth every 6 (six) hours as needed. Patient not taking: Reported on 07/14/2015 06/26/15   Tatyana Kirichenko, PA-C  lurasidone (LATUDA) 40 MG TABS tablet Take 40 mg by mouth daily with breakfast.    Historical Provider, MD  meloxicam (MOBIC) 15 MG tablet Take 15 mg by mouth daily.    Historical Provider, MD  nitrofurantoin (MACRODANTIN) 100 MG capsule Take 1 capsule (100 mg total) by mouth 2 (two) times daily. Patient not taking: Reported on 06/26/2015 06/15/15   Junius Creamer, NP  nitrofurantoin, macrocrystal-monohydrate, (MACROBID) 100 MG capsule Take 1 capsule (100 mg total) by mouth 2 (two) times daily. Patient not taking: Reported on 08/10/2015 08/02/15   Antony Blackbird, MD  ondansetron (ZOFRAN) 4 MG tablet Take 1 tablet (4 mg total) by mouth every 6 (six) hours. Patient not taking: Reported on 08/06/2015 04/22/15   Elmyra Ricks Pisciotta, PA-C  PARoxetine (PAXIL-CR) 25 MG 24 hr tablet Take 1 tablet (25 mg total) by mouth daily. Patient not taking: Reported on 04/21/2015 02/23/15   Jonetta Osgood, MD  phenazopyridine (PYRIDIUM) 200 MG tablet Take 1 tablet (200 mg total) by mouth 3 (three) times daily with meals. Patient not taking: Reported on 07/14/2015 06/15/15   Junius Creamer, NP  polyethylene glycol [facility] / GLYCOLAX) packet Take 17 g by mouth daily as needed for mild constipation. Patient not taking: Reported on 08/06/2015 05/12/15   Donne Hazel, MD  saccharomyces boulardii (FLORASTOR) 250 MG capsule Take 1 capsule (250 mg total) by mouth 2 (two) times daily. Patient not taking: Reported on 06/26/2015 05/19/15   Janece Canterbury, MD  Thiamine HCl (VITAMIN B-1) 250 MG tablet Take 250 mg by mouth daily.     Historical Provider, MD    Allergies  Tramadol; Sulfa antibiotics; Citalopram; and Lamotrigine  Triage Vitals: BP 123/82 mmHg  Pulse 97  Temp(Src) 98.4 F (36.9 C) (Oral)  Resp 16  Ht 5\' 8"  (1.727 m)  Wt 320 lb (145.151 kg)   BMI 48.67 kg/m2  SpO2 94%  LMP 09/13/2014  Physical Exam  Constitutional: She is oriented to person, place, and time. She appears well-developed and well-nourished.  HENT:  Head: Normocephalic and atraumatic.  Moist mucous membranes No exudate Scarring and retraction of bilateral TMs  Eyes: EOM are normal. Pupils are equal, round, and reactive to light.  Neck: Normal range of motion.  Trachea midline  Cardiovascular: Normal rate and regular rhythm.   Pulmonary/Chest: Effort normal and breath sounds normal.  Abdominal: Soft. Bowel sounds are normal. She exhibits no mass. There is no tenderness. There is no rebound and no guarding.  Musculoskeletal: Normal range of motion.  Neurological: She is alert and oriented to person, place, and time. She has normal reflexes.  Cranial nerves 2-12 intact 5/5 strength X  No clonus No tremor noted  Skin: Skin is warm and dry.  Psychiatric: She has a normal mood and affect. Her behavior is normal.  Nursing note and vitals reviewed.  ED Course  Procedures   DIAGNOSTIC STUDIES: Oxygen Saturation is 94% on RA, adequate by my interpretation.    COORDINATION OF CARE: 11:26 PM Discussed treatment plan which includes lab work and EKG with pt at bedside and pt agreed to plan.  Labs Reviewed  BASIC METABOLIC PANEL - Abnormal; Notable for the following:    Potassium 3.2 (*)    Chloride 112 (*)    Glucose, Bld 117 (*)    All other components within normal limits  CBC - Abnormal; Notable for the following:    Hemoglobin 15.3 (*)    All other components within normal limits  URINALYSIS, ROUTINE W REFLEX MICROSCOPIC (NOT AT [facility]) - Abnormal; Notable for the following:    Color, Urine AMBER (*)    APPearance CLOUDY (*)    Bilirubin Urine SMALL (*)    Ketones, ur 15 (*)    Protein, ur 30 (*)    Leukocytes, UA MODERATE (*)    All other components within normal limits  URINE MICROSCOPIC-ADD ON - Abnormal; Notable for the following:     Squamous Epithelial / LPF MANY (*)    Bacteria, UA FEW (*)    All other components within normal limits  URINE CULTURE  CBG MONITORING, ED    Imaging Review No results found.  I personally reviewed and evaluated these images and lab results as a part of my medical decision-making.   EKG Interpretation  Date/Time:  Monday August 14 2015 18:50:13 EDT Ventricular Rate:  94 PR Interval:  166 QRS Duration: 72 QT Interval:  358 QTC Calculation: 447 R Axis:   52 Text Interpretation:  Normal sinus rhythm Confirmed by [facility]  MD, Ebany Bowermaster ([id number]) on 08/14/2015 11:10:36 PM    MDM   Final diagnoses:  None    Orthostasis, feels better post IVF,  Cut back on your latuda as directed and follow up with the prescriber this week  I personally performed the services described in this documentation, which was scribed in my presence. The recorded information has been reviewed and is accurate.     Lang Zingg, MD 08/15/15 0400

## 2015-08-14 NOTE — ED Notes (Signed)
Pt reports to the ED for eval of AMS. She was taken off of every thing except for Latuda because he thought one of her medications made her dizzy. She has been taking Latuda and Keflex for her UTI however she is still having the dizziness and also some new psych complaints. She will be sitting there and she will start moving her head and shaking her arms. No arm drift, no facial asymmetry, grips equal. Pt A&Ox4, resp e/u, and skin warm and dry.

## 2015-08-15 ENCOUNTER — Encounter (HOSPITAL_COMMUNITY): Payer: Self-pay

## 2015-08-15 ENCOUNTER — Emergency Department (HOSPITAL_COMMUNITY): Payer: Medicaid Other

## 2015-08-15 MED ORDER — SODIUM CHLORIDE 0.9 % IV BOLUS (SEPSIS)
1000.0000 mL | Freq: Once | INTRAVENOUS | Status: AC
  Administered 2015-08-15: 1000 mL via INTRAVENOUS

## 2015-08-15 NOTE — ED Notes (Signed)
IV team at bedside 

## 2015-08-15 NOTE — ED Notes (Signed)
Reviewed discharge instructions with patient.  Also discussed her medication causing her CC today - per MD, the Latuda should have been cut in half to taper down the dose and begin on another medicine.  This RN provided patient and significant other with a pill splitter and instructed both on how to use it to cut the medicine in half for the correct dosage.  Patient and SO verbalized understanding.

## 2015-08-16 LAB — URINE CULTURE: Culture: 60000

## 2015-08-17 ENCOUNTER — Emergency Department (HOSPITAL_COMMUNITY)
Admission: EM | Admit: 2015-08-17 | Discharge: 2015-08-17 | Disposition: A | Discharge status: Home / Self Care | Payer: Medicaid Other | Attending: Emergency Medicine | Admitting: Emergency Medicine

## 2015-08-17 ENCOUNTER — Encounter (HOSPITAL_COMMUNITY): Payer: Self-pay | Admitting: Emergency Medicine

## 2015-08-17 ENCOUNTER — Telehealth (HOSPITAL_COMMUNITY): Payer: Self-pay

## 2015-08-17 DIAGNOSIS — F419 Anxiety disorder, unspecified: Secondary | ICD-10-CM | POA: Insufficient documentation

## 2015-08-17 DIAGNOSIS — Z87440 Personal history of urinary (tract) infections: Secondary | ICD-10-CM | POA: Diagnosis not present

## 2015-08-17 DIAGNOSIS — F319 Bipolar disorder, unspecified: Secondary | ICD-10-CM | POA: Insufficient documentation

## 2015-08-17 DIAGNOSIS — Z792 Long term (current) use of antibiotics: Secondary | ICD-10-CM | POA: Insufficient documentation

## 2015-08-17 DIAGNOSIS — D531 Other megaloblastic anemias, not elsewhere classified: Secondary | ICD-10-CM | POA: Insufficient documentation

## 2015-08-17 DIAGNOSIS — R42 Dizziness and giddiness: Secondary | ICD-10-CM | POA: Diagnosis present

## 2015-08-17 DIAGNOSIS — Z8619 Personal history of other infectious and parasitic diseases: Secondary | ICD-10-CM | POA: Insufficient documentation

## 2015-08-17 DIAGNOSIS — Z79899 Other long term (current) drug therapy: Secondary | ICD-10-CM | POA: Insufficient documentation

## 2015-08-17 DIAGNOSIS — E669 Obesity, unspecified: Secondary | ICD-10-CM | POA: Diagnosis not present

## 2015-08-17 MED ORDER — LORAZEPAM 2 MG/ML IJ SOLN
1.0000 mg | Freq: Once | INTRAMUSCULAR | Status: AC
  Administered 2015-08-17: 1 mg via INTRAMUSCULAR
  Filled 2015-08-17: qty 1

## 2015-08-17 NOTE — ED Provider Notes (Signed)
CSN: [id number]     Arrival date & time 08/17/15  1741 History   First MD Initiated Contact with Patient 08/17/15 1936     Chief Complaint  Patient presents with  . Referral  . Dizziness     (Consider location/radiation/quality/duration/timing/severity/associated sxs/prior Treatment) HPI   Patient has had dizziness for many months with multiple workups, and seems to be here because she thinks her bipolar is out of control. She has episodes of voluntary shaking that bothers her boyrfriend. No fevers, no unresponsiveness. No post ictal state. No other obvious red flags. No SI/HI or AVH.  Past Medical History  Diagnosis Date  . Mental disorder   . Bipolar 1 disorder ([place])   . Bipolar 1 disorder ([place])   . Yeast infection of the vagina   . Bipolar disorder ([place])   . Obesity   . UTI (lower urinary tract infection)   . Alcohol abuse   . Shortness of breath dyspnea   . Anemia   . 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 Cholecystectomy     History reviewed. No pertinent family history. Social History  Substance Use Topics  . Smoking status: Never Smoker   . Smokeless tobacco: Never Used  . Alcohol Use: No   OB History    Gravida Para Term Preterm AB TAB SAB Ectopic Multiple Living   1 1 1       1      Review of Systems  Constitutional: Negative for fever.  HENT: Negative for congestion and facial swelling.   Eyes: Negative for discharge and redness.  Respiratory: Negative for cough and shortness of breath.   Cardiovascular: Negative for chest pain.  Gastrointestinal: Negative for abdominal pain and abdominal distention.  Endocrine: Negative for polydipsia.  Genitourinary: Negative for dysuria.  Musculoskeletal: Negative for back pain.  Skin: Negative for wound.  Neurological: Negative for headaches.      Allergies  Tramadol; Sulfa antibiotics; Citalopram; and Lamotrigine  Home Medications    Prior to Admission medications   Medication Sig Start Date End Date Taking? Authorizing Provider  cephALEXin (KEFLEX) 500 MG capsule Take 1 capsule (500 mg total) by mouth 4 (four) times daily. 08/06/15  Yes Lacretia Leigh, MD  cyanocobalamin (,VITAMIN B-12,) 1000 MCG/ML injection Inject subcutaneously q Daily till 02/27/15,then, Inject subcutaneously q weekly for 4 weeks from 02/28/15, Then from 03/29/15-inject subcutaneously q monthly and stay on it Patient taking differently: Inject 1,000 mcg into the skin every 30 (thirty) days.  02/23/15  Yes Shanker Kristeen Mans, MD  lurasidone (LATUDA) 40 MG TABS tablet Take 20 mg by mouth daily with breakfast.    Yes Historical Provider, MD  docusate sodium (COLACE) 100 MG capsule Take 1 capsule (100 mg total) by mouth every 12 (twelve) hours. Patient not taking: Reported on 05/17/2015 05/12/15   Donne Hazel, MD  folic acid (FOLVITE) 1 MG tablet Take 1 tablet (1 mg total) by mouth daily. Patient not taking: Reported on 08/17/2015 02/23/15   Jonetta Osgood, MD  HYDROcodone-acetaminophen (NORCO) 5-325 MG per tablet Take 1 tablet by mouth every 6 (six) hours as needed. Patient not taking: Reported on 07/14/2015 06/26/15   Jeannett Senior, PA-C  nitrofurantoin (MACRODANTIN) 100 MG capsule Take 1 capsule (100 mg total) by mouth 2 (two) times daily. Patient not taking: Reported on 06/26/2015 06/15/15   Junius Creamer, NP  nitrofurantoin, macrocrystal-monohydrate, (MACROBID) 100 MG capsule Take 1 capsule (100  mg total) by mouth 2 (two) times daily. Patient not taking: Reported on 08/10/2015 08/02/15   Antony Blackbird, MD  ondansetron (ZOFRAN) 4 MG tablet Take 1 tablet (4 mg total) by mouth every 6 (six) hours. Patient not taking: Reported on 08/06/2015 04/22/15   Elmyra Ricks Pisciotta, PA-C  PARoxetine (PAXIL-CR) 25 MG 24 hr tablet Take 1 tablet (25 mg total) by mouth daily. Patient not taking: Reported on 04/21/2015 02/23/15   Jonetta Osgood, MD  phenazopyridine (PYRIDIUM) 200 MG  tablet Take 1 tablet (200 mg total) by mouth 3 (three) times daily with meals. Patient not taking: Reported on 07/14/2015 06/15/15   Junius Creamer, NP  polyethylene glycol [facility] / GLYCOLAX) packet Take 17 g by mouth daily as needed for mild constipation. Patient not taking: Reported on 08/06/2015 05/12/15   Donne Hazel, MD  saccharomyces boulardii (FLORASTOR) 250 MG capsule Take 1 capsule (250 mg total) by mouth 2 (two) times daily. Patient not taking: Reported on 06/26/2015 05/19/15   Janece Canterbury, MD   BP 154/82 mmHg  Pulse 87  Temp(Src) 97.7 F (36.5 C) (Oral)  Resp 18  SpO2 94%  LMP 09/13/2014 Physical Exam  Constitutional: She appears well-developed and well-nourished.  HENT:  Head: Normocephalic and atraumatic.  Neck: Normal range of motion.  Cardiovascular: Normal rate and regular rhythm.   Pulmonary/Chest: No stridor. No respiratory distress.  Abdominal: She exhibits no distension.  Neurological: She is alert.  Skin: Skin is warm and dry.  Psychiatric: Her mood appears anxious. Her speech is not rapid and/or pressured, not delayed, not tangential and not slurred. She is not actively hallucinating. Thought content is not paranoid. She does not express impulsivity. She expresses no homicidal and no suicidal ideation.  Nursing note and vitals reviewed.   ED Course  Procedures (including critical care time) Labs Review Labs Reviewed - No data to display  Imaging Review No results found. I have personally reviewed and evaluated these images and lab results as part of my medical decision-making.   EKG Interpretation   Date/Time:  Thursday August 17 2015 18:08:24 EDT Ventricular Rate:  97 PR Interval:  157 QRS Duration: 70 QT Interval:  349 QTC Calculation: 443 R Axis:   56 Text Interpretation:  Sinus rhythm Confirmed by Drema Eddington MD, Corene Cornea [phone])  on 08/17/2015 8:19:05 PM      MDM   Final diagnoses:  Bipolar 1 disorder ([place])   49 yo F here with concerns of  worsening bipolar and wanting different medications. No e/o decompensation on my exam. No SI/HI or AVH worsening. Seems her shaking episodes, one of which I witnessed, were mostly voluntary. Will not make any medication changes at this time and defer to outpatient team. Dizziness is prolonged problem for patient. No new neuro/infectious/cardiac symptoms to prompt workup for this chronic problem.   I have personally and contemperaneously reviewed labs and imaging and used in my decision making as above.   A medical screening exam was performed and I feel the patient has had an appropriate workup for their chief complaint at this time and likelihood of emergent condition existing is low. They have been counseled on decision, discharge, follow up and which symptoms necessitate immediate return to the emergency department. They or their family verbally stated understanding and agreement with plan and discharged in stable condition.      Merrily Pew, MD 08/18/15 1113

## 2015-08-17 NOTE — Progress Notes (Signed)
ED Antimicrobial Stewardship Positive Culture Follow Up   Jordan [name] is an 49 y.o. female who presented to [facility] on 08/14/2015 with a chief complaint of  Chief Complaint  Patient presents with  . Dizziness    Recent Results (from the past 720 hour(s))  Urine culture     Status: None   Collection Time: 08/02/15  7:25 PM  Result Value Ref Range Status   Specimen Description URINE, CLEAN CATCH  Final   Special Requests NONE  Final   Culture MULTIPLE SPECIES PRESENT, SUGGEST RECOLLECTION  Final   Report Status 08/04/2015 FINAL  Final  Urine culture     Status: None   Collection Time: 08/06/15  9:30 PM  Result Value Ref Range Status   Specimen Description URINE, CLEAN CATCH  Final   Special Requests NONE  Final   Culture   Final    MULTIPLE SPECIES PRESENT, SUGGEST RECOLLECTION Performed at [facility]    Report Status 08/08/2015 FINAL  Final  Urine culture     Status: None   Collection Time: 08/14/15  7:50 PM  Result Value Ref Range Status   Specimen Description URINE, CLEAN CATCH  Final   Special Requests ADDED 0148 08/15/15  Final   Culture   Final    60,000 COLONIES/ml DIPHTHEROIDS(CORYNEBACTERIUM SPECIES) Standardized susceptibility testing for this organism is not available.    Report Status 08/16/2015 FINAL  Final   [x]  Patient discharged originally without antimicrobial agent   New antibiotic prescription: none  ED Provider: Ottie Glazier, PA-C  Assessment: Pt presenting to ED with dizziness. UA bacteria few, leuko mod, squamous many. WBC wnl. UCx now growing diphtheroids. Likely skin flora. Plan no treatment.  Darl Pikes, PharmD Clinical Pharmacist- Resident Pager: [phone]  Darl Pikes 08/17/2015, 9:07 AM Infectious Diseases Pharmacist Phone# [phone]

## 2015-08-17 NOTE — ED Notes (Signed)
Pt states that she is not satisfied with the mental health care that she is receiving and wants a referral to [facility]. Pt is also c/o intermittent dizziness x 1 year. Alert and oriented.

## 2015-08-17 NOTE — Telephone Encounter (Signed)
Post ED Visit - Positive Culture Follow-up  Culture report reviewed by antimicrobial stewardship pharmacist:  []  Heide Guile, Pharm.D., BCPS []  Alycia Rossetti, Pharm.D., BCPS []  [place], Pharm.D., BCPS, AAHIVP []  Legrand Como, Pharm.D., BCPS, AAHIVP []  [place], Pharm.D. [x]  Joyice Faster. Pharm D Milus Glazier, Pharm.D.  Positive urine culture  no further patient follow-up is required at this time per [employer] PA.  Ileene Musa 08/17/2015, 9:58 AM

## 2015-08-19 ENCOUNTER — Encounter (HOSPITAL_COMMUNITY): Payer: Self-pay | Admitting: Nurse Practitioner

## 2015-08-19 ENCOUNTER — Emergency Department (HOSPITAL_COMMUNITY)
Admission: EM | Admit: 2015-08-19 | Discharge: 2015-08-19 | Disposition: A | Discharge status: Home / Self Care | Payer: Medicaid Other | Attending: Emergency Medicine | Admitting: Emergency Medicine

## 2015-08-19 DIAGNOSIS — Z87898 Personal history of other specified conditions: Secondary | ICD-10-CM

## 2015-08-19 DIAGNOSIS — E669 Obesity, unspecified: Secondary | ICD-10-CM | POA: Diagnosis not present

## 2015-08-19 DIAGNOSIS — F319 Bipolar disorder, unspecified: Secondary | ICD-10-CM | POA: Insufficient documentation

## 2015-08-19 DIAGNOSIS — D649 Anemia, unspecified: Secondary | ICD-10-CM | POA: Diagnosis not present

## 2015-08-19 DIAGNOSIS — Z792 Long term (current) use of antibiotics: Secondary | ICD-10-CM | POA: Diagnosis not present

## 2015-08-19 DIAGNOSIS — R42 Dizziness and giddiness: Secondary | ICD-10-CM | POA: Insufficient documentation

## 2015-08-19 DIAGNOSIS — Z8619 Personal history of other infectious and parasitic diseases: Secondary | ICD-10-CM | POA: Diagnosis not present

## 2015-08-19 DIAGNOSIS — Z79899 Other long term (current) drug therapy: Secondary | ICD-10-CM | POA: Diagnosis not present

## 2015-08-19 DIAGNOSIS — Z87440 Personal history of urinary (tract) infections: Secondary | ICD-10-CM | POA: Insufficient documentation

## 2015-08-19 NOTE — ED Notes (Addendum)
Pt states she doesn't want to take her Bipolar meds because they make her feel dizzy.  States she has been taking the medication for a year. She did not take it today because she did not want to be dizzy.  She told her doctor and he reduced the dose but it still made her dizzy.  She denies SI, HI, substance use. She is A&Ox4, resp e/u, denies pain

## 2015-08-19 NOTE — ED Provider Notes (Signed)
CSN: [id number]     Arrival date & time 08/19/15  1704 History   First MD Initiated Contact with Patient 08/19/15 1839     Chief Complaint  Patient presents with  . Dizziness     (Consider location/radiation/quality/duration/timing/severity/associated sxs/prior Treatment) HPI   49 year old female with history of bipolar, alcohol abuse, presenting to the ED with complaints of dizziness. Patient has had recurrent dizziness for nearly a year with multiple work up for same. She believes is related to her psych medication. She has not been taking her bipolar med "Latuda" today because it worsened symptoms. She denies SI/HI/auditory or visual hallucination, or substance abuse. She does not feel comfortable with her psychiatrist. She felt she is not receiving adequate care. This time she denies any headache, hearing changes, tinnitus, dysuria, or having any pain. Patient is frustrated at her persistent dizziness and stated "I just want to be normal again".  Past Medical History  Diagnosis Date  . Mental disorder   . Bipolar 1 disorder ([place])   . Bipolar 1 disorder ([place])   . Yeast infection of the vagina   . Bipolar disorder ([place])   . Obesity   . UTI (lower urinary tract infection)   . Alcohol abuse   . Shortness of breath dyspnea   . Anemia   . 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 Cholecystectomy     History reviewed. No pertinent family history. Social History  Substance Use Topics  . Smoking status: Never Smoker   . Smokeless tobacco: Never Used  . Alcohol Use: No   OB History    Gravida Para Term Preterm AB TAB SAB Ectopic Multiple Living   1 1 1       1      Review of Systems  All other systems reviewed and are negative.     Allergies  Tramadol; Sulfa antibiotics; Citalopram; and Lamotrigine  Home Medications   Prior to Admission medications   Medication Sig Start Date End Date Taking?  Authorizing Provider  cephALEXin (KEFLEX) 500 MG capsule Take 1 capsule (500 mg total) by mouth 4 (four) times daily. 08/06/15   Lacretia Leigh, MD  cyanocobalamin (,VITAMIN B-12,) 1000 MCG/ML injection Inject subcutaneously q Daily till 02/27/15,then, Inject subcutaneously q weekly for 4 weeks from 02/28/15, Then from 03/29/15-inject subcutaneously q monthly and stay on it Patient taking differently: Inject 1,000 mcg into the skin every 30 (thirty) days.  02/23/15   Shanker Kristeen Mans, MD  docusate sodium (COLACE) 100 MG capsule Take 1 capsule (100 mg total) by mouth every 12 (twelve) hours. Patient not taking: Reported on 05/17/2015 05/12/15   Donne Hazel, MD  folic acid (FOLVITE) 1 MG tablet Take 1 tablet (1 mg total) by mouth daily. Patient not taking: Reported on 08/17/2015 02/23/15   Jonetta Osgood, MD  HYDROcodone-acetaminophen (NORCO) 5-325 MG per tablet Take 1 tablet by mouth every 6 (six) hours as needed. Patient not taking: Reported on 07/14/2015 06/26/15   Tatyana Kirichenko, PA-C  lurasidone (LATUDA) 40 MG TABS tablet Take 20 mg by mouth daily with breakfast.     Historical Provider, MD  nitrofurantoin (MACRODANTIN) 100 MG capsule Take 1 capsule (100 mg total) by mouth 2 (two) times daily. Patient not taking: Reported on 06/26/2015 06/15/15   Junius Creamer, NP  nitrofurantoin, macrocrystal-monohydrate, (MACROBID) 100 MG capsule Take 1 capsule (100 mg total) by mouth 2 (two) times daily. Patient not taking:  Reported on 08/10/2015 08/02/15   Antony Blackbird, MD  ondansetron (ZOFRAN) 4 MG tablet Take 1 tablet (4 mg total) by mouth every 6 (six) hours. Patient not taking: Reported on 08/06/2015 04/22/15   Elmyra Ricks Pisciotta, PA-C  PARoxetine (PAXIL-CR) 25 MG 24 hr tablet Take 1 tablet (25 mg total) by mouth daily. Patient not taking: Reported on 04/21/2015 02/23/15   Jonetta Osgood, MD  phenazopyridine (PYRIDIUM) 200 MG tablet Take 1 tablet (200 mg total) by mouth 3 (three) times daily with meals. Patient  not taking: Reported on 07/14/2015 06/15/15   Junius Creamer, NP  polyethylene glycol [facility] / GLYCOLAX) packet Take 17 g by mouth daily as needed for mild constipation. Patient not taking: Reported on 08/06/2015 05/12/15   Donne Hazel, MD  saccharomyces boulardii (FLORASTOR) 250 MG capsule Take 1 capsule (250 mg total) by mouth 2 (two) times daily. Patient not taking: Reported on 06/26/2015 05/19/15   Janece Canterbury, MD   BP 132/94 mmHg  Pulse 100  Temp(Src) 98 F (36.7 C) (Oral)  Resp 18  SpO2 96%  LMP 09/13/2014 Physical Exam  Constitutional: She appears well-developed and well-nourished. No distress.  HENT:  Head: Atraumatic.  Right Ear: External ear normal.  Left Ear: External ear normal.  Eyes: Conjunctivae are normal.  Neck: Neck supple.  Neurological: She is alert. GCS eye subscore is 4. GCS verbal subscore is 5. GCS motor subscore is 6.  Able to ambulate  Skin: No rash noted.  Psychiatric: Her affect is angry. Her speech is rapid and/or pressured. She is agitated. Thought content is not paranoid. She expresses no homicidal and no suicidal ideation.  Nursing note and vitals reviewed.   ED Course  Procedures (including critical care time)  Patient here with persistent dizziness for a year, which she relates to her psychiatric medication. She has had extensive workup for this in the past. She was seen in the ED 2 days ago for the exact same symptoms. I agree the patient does not have any acute emergent condition that would require additional workup. I strongly encouraged patient to follow-up with her psychiatrist for further care. I also offer patient a list of resources if she would like to find other provider.    MDM   Final diagnoses:  H/O dizziness    BP 132/94 mmHg  Pulse 100  Temp(Src) 98 F (36.7 C) (Oral)  Resp 18  SpO2 96%  LMP 09/13/2014     Domenic Moras, PA-C 08/19/15 1907  Carmin Muskrat, MD 08/19/15 2355

## 2015-08-19 NOTE — Discharge Instructions (Signed)
Vertigo Vertigo means that you feel like you are moving when you are not. Vertigo can also make you feel like things around you are moving when they are not. This feeling can come and go at any time. Vertigo often goes away on its own. HOME CARE  Avoid making fast movements.  Avoid driving.  Avoid using heavy machinery.  Avoid doing any task or activity that might cause danger to you or other people if you would have a vertigo attack while you are doing it.  Sit down right away if you feel dizzy or have trouble with your balance.  Take over-the-counter and prescription medicines only as told by your doctor.  Follow instructions from your doctor about which positions or movements you should avoid.  Drink enough fluid to keep your pee (urine) clear or pale yellow.  Keep all follow-up visits as told by your doctor. This is important. GET HELP IF:  Medicine does not help your vertigo.  You have a fever.  Your problems get worse or you have new symptoms.  Your family or friends see changes in your behavior.  You feel sick to your stomach (nauseous) or you throw up (vomit).  You have a "pins and needles" feeling or you are numb in part of your body. GET HELP RIGHT AWAY IF:  You have trouble moving or talking.  You are always dizzy.  You pass out (faint).  You get very bad headaches.  You feel weak or have trouble using your hands, arms, or legs.  You have changes in your hearing.  You have changes in your seeing (vision).  You get a stiff neck.  Bright light starts to bother you.   This information is not intended to replace advice given to you by your health care provider. Make sure you discuss any questions you have with your health care provider.   Document Released: 07/16/2008 Document Revised: 06/28/2015 Document Reviewed: 01/30/2015 Elsevier Interactive Patient Education 2016 [employer].   Emergency Department Resource Guide 1) Find a Doctor and Pay Out  of Pocket Although you won't have to find out who is covered by your insurance plan, it is a good idea to ask around and get recommendations. You will then need to call the office and see if the doctor you have chosen will accept you as a new patient and what types of options they offer for patients who are self-pay. Some doctors offer discounts or will set up payment plans for their patients who do not have insurance, but you will need to ask so you aren't surprised when you get to your appointment.  2) Contact Your Local Health Department Not all health departments have doctors that can see patients for sick visits, but many do, so it is worth a call to see if yours does. If you don't know where your local health department is, you can check in your phone book. The CDC also has a tool to help you locate your state's health department, and many state websites also have listings of all of their local health departments.  3) Find a [facility] If your illness is not likely to be very severe or complicated, you may want to try a walk in clinic. These are popping up all over the country in pharmacies, drugstores, and shopping centers. They're usually staffed by nurse practitioners or physician assistants that have been trained to treat common illnesses and complaints. They're usually fairly quick and inexpensive. However, if you have serious medical  issues or chronic medical problems, these are probably not your best option.  No Primary Care Doctor: - Call Health Connect at  [phone] - they can help you locate a primary care doctor that  accepts your insurance, provides certain services, etc. - Physician Referral Service- [phone]  Chronic Pain Problems: Organization         Address  Phone   Notes  [facility]  [phone] Patients need to be referred by their primary care doctor.   Medication Assistance: Organization         Address  Phone   Notes  [facility] [address]  Medication [facility] [address] [place], [place] [address] [phone] --Must be a resident of [facility] -- Must have NO insurance coverage whatsoever (no Medicaid/ Medicare, etc.) -- The pt. MUST have a primary care doctor that directs their care regularly and follows them in the community   MedAssist  [phone]   [employer]  [phone]    Agencies that provide inexpensive medical care: Organization         Address  Phone   Notes  [place]  [phone]   [facility]    [phone]   [facility] [place], [place] [address] [phone]   [place] [address], [place] [phone]   Planned Parenthood    [phone]   [facility]    [phone]   [place] and [place] [address], [place] Phone:  [phone], Fax:  [phone] Hours of Operation:  9 am - 6 pm, M-F.  Also accepts Medicaid/Medicare and self-pay.  [facility] for [place] [address], [address], [place] Phone: [phone], Fax: [phone]. Hours of Operation:  8:30 am - 5:30 pm, M-F.  Also accepts Medicaid and self-pay.  [facility] [address] [place] Phone: [phone]   [place], [place], [place] [phone], Ext. [phone] Mondays & Thursdays: 7-9 AM.  First 15 patients are seen on a first come, first serve basis.    [place] Providers:  Organization         Address  Phone   Notes  [facility] [address], [place] [phone] Also accepts self-pay patients.  [facility] [address], [place]  [phone]   [place], [address], [place] [phone]   [facility] [address] [place] [phone]   Lucianne Lei [address], [place]   [phone] Only accepts [place] Access [place] patients after they have their name applied to their card.   Self-Pay (no insurance) in [facility]:  Organization         Address  Phone   Notes  Sickle Cell Patients, [facility] [place] [phone]   [facility] [place] [facility] [phone]   [facility] [place]  [place], [address], [place] [phone]   [facility]/Dr. Osei-Bonsu  [address] [place] or [address], [place] [phone] Phone number for both [place] and [place] locations is the  same.  Urgent Medical and [facility] [address] [place] [phone]   [facility] [address] [place] or [address] [phone]   [facility] [address], [place] [phone], phone; [phone], fax Sees patients 1st and 3rd Saturday of every month.  Must not qualify for public or private insurance (i.e. Medicaid, Medicare, [place] Health Choice, Veterans' Benefits)  Household income should be no more than 200% of the poverty level The clinic cannot treat you if you are pregnant or think you are pregnant  Sexually transmitted diseases are not treated at the clinic.    Dental Care: Organization         Address  Phone  Notes  [facility] Department of [facility] [phone] Accepts children up to age 74 who are enrolled in [place] or [place]; pregnant women with a Medicaid card; and children who have applied for Medicaid or [place] Health Choice, but were declined, whose parents can pay a reduced fee at time of service.  [facility] Department of [facility]  [address], [place]  [phone] Accepts children up to age 93 who are enrolled in [place] or [place]; pregnant women with a Medicaid card; and children who have applied for Medicaid or  Health Choice, but were declined, whose parents can pay a reduced fee at time of service.  [place] Adult [facility] Access PROGRAM  [place] [phone] Patients are seen by appointment only. Walk-ins are not accepted. [place] will see patients 55 years of age and older. Monday - Tuesday (8am-5pm) Most Wednesdays (8:30-5pm) $30 per visit, cash only  [facility] Adult Dental Access PROGRAM  [address], [facility] [phone] Patients are seen by appointment only. Walk-ins are not accepted. [place] will see patients 80 years of age and older. One Wednesday Evening (Monthly: Volunteer Based).  $30 per visit, cash only  [place]  [phone] for adults; Children under age 53, call Graduate Pediatric Dentistry at [phone]. Children aged 66-14, please call [phone] to request a pediatric application.  Dental services are provided in all areas of dental care including fillings, crowns and bridges, complete and partial dentures, implants, gum treatment, root canals, and extractions. Preventive care is also provided. Treatment is provided to both adults and children. Patients are selected via a lottery and there is often a waiting list.   [facility] [address], [place]  [phone] [email or web address]   [facility] [address] [place], [place] [phone], Ext. [phone] Second and Fourth Thursday of each month, opens at 6:30 AM; Clinic ends at 9 AM.  Patients are seen on a first-come first-served basis, and a limited number are seen during each clinic.   [facility]  [address] Hillard Danker [place], [place] [phone]   Eligibility Requirements You must have lived in [place], [place], or [place]  counties for at least the last three months.   You cannot be eligible for state or federal sponsored [employer], including [employer], [place], or [employer].   You generally cannot be eligible for healthcare insurance through your employer.    How to apply: Eligibility screenings are held every Tuesday and Wednesday afternoon from 1:00 pm until 4:00 pm. You do not need an appointment for  the interview!  [facility] [address] [place], [place]   [place]  [place] Department  [place]  [phone]    Behavioral Health Resources in the Community: Intensive Outpatient Programs Organization         Address  Phone  Notes  [place]. [address] [place], [place] [phone]   [facility] [address], [place], [place]   ADS: Alcohol & Drug Svcs [address], [place], [place]   [place] [address],  [place], [place] or [phone]   Substance Abuse Resources Organization         Address  Phone  Notes  Alcohol and Drug Services  [phone]   [place]  [phone]   [facility] [place]   Chinita Pester  [phone]   Residential & Outpatient Substance Abuse Program  [phone]   Psychological Services Organization         Address  Phone  Notes  [facility]  [place]  [phone]   [place] [address] [place] or [phone]    Mobile Crisis Teams Organization         Address  Phone  Notes  [facility], Mobile Crisis Care Unit  [phone]   [facility]  [address] [place], [place]   [place] Levels [address], [place] [phone]    Self-Help/Support Groups Organization         Address  Phone             Notes  [place]. of [place] - variety of support groups  [place] Call for more information  Narcotics Anonymous (NA), [facility] [address], [employer] [place]  2 meetings at this location   Special educational needs teacher         Address  Phone  Notes  [facility] Residential Treatment [place],    [place]  [phone]   [facility]  [address], [place] [address]38[address], [place], [place]   [place], [place] [phone] Admissions: 8am-3pm M-F  Incentives Substance [place] [address],    [place], [place] [phone]   [facility] [address] [place], [place], [place]   [facility] [address],  [place], [place]   [facility] - Intensive Outpatient [address] Kristeen [address], [place], [place]   [facility] [address]  [place], [place] [phone] or [phone]   [facility] Services (RTS) [address] [place], [place] Accepts Medicaid  [facility] [address]  [place] [phone] Substance Abuse/Addiction Treatment   [facility] Organization         Address  Phone  Notes  [facility]  [phone]   Domenic Schwab, PhD [address] Arlis Port[place], [place]   [phone] or [phone]   [place] [place], [place] [phone]   [place] [address], [place], [place] [phone] Insurance/Medicaid/sponsorship through [employer] and Families [address], [address]  [place], [place] [phone] [place], [place] [phone]    Dr. Adele Schilder  [phone]   [facility] Dept. 1) [address], [place] 2) [place] 3)  [place] [address], [place] [phone]  [phone]   [place] [phone] or [phone] (After Hours)

## 2015-08-19 NOTE — ED Notes (Signed)
Patient left at this time with all belongings. 

## 2015-10-27 ENCOUNTER — Other Ambulatory Visit: Payer: Self-pay | Admitting: Specialist

## 2015-10-27 DIAGNOSIS — N644 Mastodynia: Secondary | ICD-10-CM

## 2015-11-01 ENCOUNTER — Ambulatory Visit
Admission: RE | Admit: 2015-11-01 | Discharge: 2015-11-01 | Disposition: A | Discharge status: Home / Self Care | Payer: Medicaid Other | Source: Ambulatory Visit | Attending: Specialist | Admitting: Specialist

## 2015-11-01 DIAGNOSIS — N644 Mastodynia: Secondary | ICD-10-CM

## 2015-11-14 ENCOUNTER — Encounter (HOSPITAL_COMMUNITY): Payer: Self-pay

## 2015-11-14 ENCOUNTER — Emergency Department (HOSPITAL_COMMUNITY)
Admission: EM | Admit: 2015-11-14 | Discharge: 2015-11-14 | Disposition: A | Discharge status: Home / Self Care | Payer: Medicaid Other | Attending: Emergency Medicine | Admitting: Emergency Medicine

## 2015-11-14 DIAGNOSIS — F319 Bipolar disorder, unspecified: Secondary | ICD-10-CM | POA: Diagnosis not present

## 2015-11-14 DIAGNOSIS — Z79899 Other long term (current) drug therapy: Secondary | ICD-10-CM | POA: Insufficient documentation

## 2015-11-14 DIAGNOSIS — Z87440 Personal history of urinary (tract) infections: Secondary | ICD-10-CM | POA: Diagnosis not present

## 2015-11-14 DIAGNOSIS — E669 Obesity, unspecified: Secondary | ICD-10-CM | POA: Diagnosis not present

## 2015-11-14 DIAGNOSIS — D649 Anemia, unspecified: Secondary | ICD-10-CM | POA: Insufficient documentation

## 2015-11-14 DIAGNOSIS — N644 Mastodynia: Secondary | ICD-10-CM | POA: Diagnosis present

## 2015-11-14 DIAGNOSIS — G8929 Other chronic pain: Secondary | ICD-10-CM | POA: Diagnosis not present

## 2015-11-14 DIAGNOSIS — Z8619 Personal history of other infectious and parasitic diseases: Secondary | ICD-10-CM | POA: Insufficient documentation

## 2015-11-14 DIAGNOSIS — Z792 Long term (current) use of antibiotics: Secondary | ICD-10-CM | POA: Diagnosis not present

## 2015-11-14 NOTE — Discharge Instructions (Signed)
Breast Tenderness Breast tenderness is a common problem for women of all ages. Breast tenderness may cause mild discomfort to severe pain. It has a variety of causes. Your health care provider will find out the likely cause of your breast tenderness by examining your breasts, asking you about symptoms, and ordering some tests. Breast tenderness usually does not mean you have breast cancer. HOME CARE INSTRUCTIONS  Breast tenderness often can be handled at home. You can try:  Getting fitted for a new bra that provides more support, especially during exercise.  Wearing a more supportive bra or sports bra while sleeping when your breasts are very tender.  If you have a breast injury, apply ice to the area:  Put ice in a plastic bag.  Place a towel between your skin and the bag.  Leave the ice on for 20 minutes, 2-3 times a day.  If your breasts are too full of milk as a result of breastfeeding, try:  Expressing milk either by hand or with a breast pump.  Applying a warm compress to the breasts for relief.  Taking over-the-counter pain relievers, if approved by your health care provider.  Taking other medicines that your health care provider prescribes. These may include antibiotic medicines or birth control pills. Over the long term, your breast tenderness might be eased if you:  Cut down on caffeine.  Reduce the amount of fat in your diet. Keep a log of the days and times when your breasts are most tender. This will help you and your health care provider find the cause of the tenderness and how to relieve it. Also, learn how to do breast exams at home. This will help you notice if you have an unusual growth or lump that could cause tenderness. SEEK MEDICAL CARE IF:   Any part of your breast is hard, red, and hot to the touch. This could be a sign of infection.  Fluid is coming out of your nipples (and you are not breastfeeding). Especially watch for blood or pus.  You have a fever  as well as breast tenderness.  You have a new or painful lump in your breast that remains after your menstrual period ends.  You have tried to take care of the pain at home, but it has not gone away.  Your breast pain is getting worse, or the pain is making it hard to do the things you usually do during your day.   This information is not intended to replace advice given to you by your health care provider. Make sure you discuss any questions you have with your health care provider.   Document Released: 09/19/2008 Document Revised: 06/09/2013 Document Reviewed: 05/06/2013 Elsevier Interactive Patient Education 2016 [place] A mammogram is an X-ray of the breasts that is done to check for changes that are not normal. This test can screen for and find any changes that may suggest breast cancer. This test can also help to find other changes and variations in the breast. BEFORE THE PROCEDURE  Have this test done about 1-2 weeks after your period. This is usually when your breasts are the least tender.  If you are visiting a new doctor or clinic, send any past mammogram images to your new doctor's office.  Wash your breasts and under your arms the day of the test.  Do not use deodorants, perfumes, lotions, or powders on the day of the test.  Take off any jewelry from your neck.  Wear  clothes that you can change into and out of easily. PROCEDURE  You will undress from the waist up. You will put on a gown.  You will stand in front of the X-ray machine.  Each breast will be placed between two plastic or glass plates. The plates will press down on your breast for a few seconds. Try to stay as relaxed as possible. This does not cause any harm to your breasts. Any discomfort you feel will be very brief.  X-rays will be taken from different angles of each breast. The procedure may vary among doctors and hospitals.  AFTER THE PROCEDURE  The mammogram will be looked at by a  specialist (radiologist).  You may need to do certain parts of the test again. This depends on the quality of the images.  Ask when your test results will be ready. Make sure you get your test results.  You may go back to your normal activities.   This information is not intended to replace advice given to you by your health care provider. Make sure you discuss any questions you have with your health care provider.   Document Released: 01/03/2009 Document Revised: 09/26/2011 Document Reviewed: 12/16/2014 Elsevier Interactive Patient Education [employer].

## 2015-11-14 NOTE — ED Notes (Signed)
Bed: [id number] Expected date:  Expected time:  Means of arrival:  Comments: EMS 50 yo female breast tenderness/mamogram 3 weeks ago-WNL

## 2015-11-14 NOTE — ED Notes (Addendum)
Pt transported by [facility] from home. Per EMS - patient c/o bilateral breast pain x "long time".  Patient had mammogram 3wks ago that was negative - patient reports she does not believe results.  Patient is currently going through menopause.  Patient denies chest pain.    Upon interviewing patient, patient states her breasts have been hurting for the past six months.  Patient reports her recent mammogram was negative but she still believes something is wrong with her breasts.  Patient denies other complaints.

## 2015-11-14 NOTE — ED Provider Notes (Signed)
CSN: [id number]     Arrival date & time 11/14/15  2001 History  By signing my name below, I, Randa Evens, attest that this documentation has been prepared under the direction and in the presence of Delos Haring, PA-C. Electronically Signed: Randa Evens, ED Scribe. 11/14/2015. 8:20 PM.    Chief Complaint  Patient presents with  . Breast Pain    The history is provided by the patient. No language interpreter was used.   HPI Comments: Jordan [name] is a 50 y.o. female brought in by ambulance, who presents to the Emergency Department complaining of chronic bilateral breast pain onset 1-2, she also says 6 months ago at one point. Pt reports normal mammogram 3 weeks prior with no abnormalities found.  Pt reports that she is currently going through menopause having mood swings, hot flashes, no more period. She denies having menstrual cycle in the past 1 year. Her primary care doctor sent her to the breast center for the mammogram but she  Doesn't believe the results because she does not feel that the technician really cared about what she was doing. The patients says she would like to find the disease and if its curable then cure it. She does have hx of bipolar type 1 and mental disorder. Hx of alcohol abuse and here with her spouse. She comes to the ER today to have the mammogram repeated or some xrays. No new symptoms or worsening pain.  PCP: [name], FNP  Jordan [name] is a 50 y.o.  female  ROS: The patient denies diaphoresis, fever, headache, weakness (general or focal), confusion, change of vision,  dysphagia, aphagia, shortness of breath,  abdominal pains, nausea, vomiting, diarrhea, lower extremity swelling, rash, neck pain, chest pain   Past Medical History  Diagnosis Date  . Mental disorder   . Bipolar 1 disorder ([place])   . Bipolar 1 disorder ([place])   . Yeast infection of the vagina   . Bipolar disorder ([place])   . Obesity   . UTI (lower urinary tract infection)   .  Alcohol abuse   . Shortness of breath dyspnea   . Anemia   . 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 Cholecystectomy     No family history on file. Social History  Substance Use Topics  . Smoking status: Never Smoker   . Smokeless tobacco: Never Used  . Alcohol Use: No   OB History    Gravida Para Term Preterm AB TAB SAB Ectopic Multiple Living   1 1 1       1      Review of Systems  Cardiovascular: Positive for chest pain (breast pain).  All other systems reviewed and are negative.    Allergies  Citalopram; Lamotrigine; Tramadol; and Sulfa antibiotics  Home Medications   Prior to Admission medications   Medication Sig Start Date End Date Taking? Authorizing Provider  cephALEXin (KEFLEX) 500 MG capsule Take 1 capsule (500 mg total) by mouth 4 (four) times daily. 08/06/15   Lacretia Leigh, MD  cyanocobalamin (,VITAMIN B-12,) 1000 MCG/ML injection Inject subcutaneously q Daily till 02/27/15,then, Inject subcutaneously q weekly for 4 weeks from 02/28/15, Then from 03/29/15-inject subcutaneously q monthly and stay on it Patient taking differently: Inject 1,000 mcg into the skin every 30 (thirty) days.  02/23/15   Shanker Kristeen Mans, MD  docusate sodium (COLACE) 100 MG capsule Take 1 capsule (100 mg total) by mouth every 12 (  twelve) hours. Patient not taking: Reported on 05/17/2015 05/12/15   Donne Hazel, MD  folic acid (FOLVITE) 1 MG tablet Take 1 tablet (1 mg total) by mouth daily. Patient not taking: Reported on 08/17/2015 02/23/15   Jonetta Osgood, MD  HYDROcodone-acetaminophen (NORCO) 5-325 MG per tablet Take 1 tablet by mouth every 6 (six) hours as needed. Patient not taking: Reported on 07/14/2015 06/26/15   Tatyana Kirichenko, PA-C  lurasidone (LATUDA) 40 MG TABS tablet Take 20 mg by mouth daily with breakfast.     Historical Provider, MD  nitrofurantoin (MACRODANTIN) 100 MG capsule Take 1 capsule (100 mg  total) by mouth 2 (two) times daily. Patient not taking: Reported on 06/26/2015 06/15/15   Junius Creamer, NP  nitrofurantoin, macrocrystal-monohydrate, (MACROBID) 100 MG capsule Take 1 capsule (100 mg total) by mouth 2 (two) times daily. Patient not taking: Reported on 08/10/2015 08/02/15   Antony Blackbird, MD  ondansetron (ZOFRAN) 4 MG tablet Take 1 tablet (4 mg total) by mouth every 6 (six) hours. Patient not taking: Reported on 08/06/2015 04/22/15   Elmyra Ricks Pisciotta, PA-C  PARoxetine (PAXIL-CR) 25 MG 24 hr tablet Take 1 tablet (25 mg total) by mouth daily. Patient not taking: Reported on 04/21/2015 02/23/15   Jonetta Osgood, MD  phenazopyridine (PYRIDIUM) 200 MG tablet Take 1 tablet (200 mg total) by mouth 3 (three) times daily with meals. Patient not taking: Reported on 07/14/2015 06/15/15   Junius Creamer, NP  polyethylene glycol [facility] / GLYCOLAX) packet Take 17 g by mouth daily as needed for mild constipation. Patient not taking: Reported on 08/06/2015 05/12/15   Donne Hazel, MD  saccharomyces boulardii (FLORASTOR) 250 MG capsule Take 1 capsule (250 mg total) by mouth 2 (two) times daily. Patient not taking: Reported on 06/26/2015 05/19/15   Janece Canterbury, MD   BP 129/78 mmHg  Pulse 86  Temp(Src) 97.6 F (36.4 C) (Oral)  Resp 18  Ht 5\' 8"  (1.727 m)  Wt 148.78 kg  BMI 49.88 kg/m2  SpO2 98%  LMP 09/13/2014   Physical Exam  Constitutional: She is oriented to person, place, and time. She appears well-developed and well-nourished. No distress.  HENT:  Head: Normocephalic and atraumatic.  Eyes: Conjunctivae and EOM are normal.  Neck: Neck supple. No tracheal deviation present.  Cardiovascular: Normal rate.   Pulmonary/Chest: Effort normal. No respiratory distress. Right breast exhibits tenderness. Right breast exhibits no inverted nipple, no mass, no nipple discharge and no skin change. Left breast exhibits tenderness. Left breast exhibits no inverted nipple, no mass, no nipple discharge and no  skin change. Breasts are symmetrical.  Pendulous breasts. Tender to palpation. No associated cellulitis  Musculoskeletal: Normal range of motion.  Neurological: She is alert and oriented to person, place, and time.  Skin: Skin is warm and dry.  Psychiatric: She has a normal mood and affect. Her behavior is normal.  Nursing note and vitals reviewed.   ED Course  Procedures (including critical care time) DIAGNOSTIC STUDIES: Oxygen Saturation is 98% on RA, normal by my interpretation.    COORDINATION OF CARE: 8:26 PM-Discussed treatment plan with pt at bedside and pt agreed to plan.     Labs Review Labs Reviewed - No data to display  Imaging Review No results found. RECOMMENDATION: Bilateral screening mammogram in 1 year is recommended peer  I have discussed the findings and recommendations with the patient. Results were also provided in writing at the conclusion of the visit. If applicable, a reminder letter will be  sent to the patient regarding the next appointment.  BI-RADS CATEGORY 1: Negative.   Electronically Signed  By: Lillia Mountain M.D.  On: 11/01/2015 13:09   EKG Interpretation None      MDM   Final diagnoses:  Pain in breast    We do not have access to a mammogram machine at this time of night from the ER. The patient had a normal mammogram on 11/01/15. I offered her a referral to a different health care facility for a repeat evaluation that she would need to schedule the appointment for. Patient would like xrays but I told her that this would not show her breast tissue. We discussed pain control ideas to help with the tenderness, especially a well supporting bra.   I personally performed the services described in this documentation, which was scribed in my presence. The recorded information has been reviewed and is accurate.      Delos H[facility], PA-C 11/14/15 [place], DO 11/14/15 2202

## 2015-12-15 ENCOUNTER — Inpatient Hospital Stay (HOSPITAL_COMMUNITY)
Admission: AD | Admit: 2015-12-15 | Discharge: 2015-12-15 | Disposition: A | Discharge status: Home / Self Care | Payer: Medicaid Other | Source: Ambulatory Visit | Attending: Family Medicine | Admitting: Family Medicine

## 2015-12-15 ENCOUNTER — Encounter (HOSPITAL_COMMUNITY): Payer: Self-pay | Admitting: *Deleted

## 2015-12-15 DIAGNOSIS — R109 Unspecified abdominal pain: Secondary | ICD-10-CM | POA: Diagnosis not present

## 2015-12-15 DIAGNOSIS — N950 Postmenopausal bleeding: Secondary | ICD-10-CM | POA: Diagnosis not present

## 2015-12-15 DIAGNOSIS — N939 Abnormal uterine and vaginal bleeding, unspecified: Secondary | ICD-10-CM

## 2015-12-15 LAB — URINALYSIS, ROUTINE W REFLEX MICROSCOPIC
Bilirubin Urine: NEGATIVE
Glucose, UA: NEGATIVE mg/dL
Ketones, ur: 15 mg/dL — AB
Nitrite: POSITIVE — AB
Protein, ur: 100 mg/dL — AB
Specific Gravity, Urine: 1.025 (ref 1.005–1.030)
pH: 6.5 (ref 5.0–8.0)

## 2015-12-15 LAB — CBC
HCT: 41.4 % (ref 36.0–46.0)
Hemoglobin: 14.6 g/dL (ref 12.0–15.0)
MCH: 30.6 pg (ref 26.0–34.0)
MCHC: 35.3 g/dL (ref 30.0–36.0)
MCV: 86.8 fL (ref 78.0–100.0)
Platelets: 207 10*3/uL (ref 150–400)
RBC: 4.77 MIL/uL (ref 3.87–5.11)
RDW: 13.1 % (ref 11.5–15.5)
WBC: 7.4 10*3/uL (ref 4.0–10.5)

## 2015-12-15 LAB — URINE MICROSCOPIC-ADD ON

## 2015-12-15 LAB — POCT PREGNANCY, URINE: Preg Test, Ur: NEGATIVE

## 2015-12-15 MED ORDER — KETOROLAC TROMETHAMINE 60 MG/2ML IM SOLN
60.0000 mg | Freq: Once | INTRAMUSCULAR | Status: AC
  Administered 2015-12-15: 60 mg via INTRAMUSCULAR
  Filled 2015-12-15: qty 2

## 2015-12-15 MED ORDER — IBUPROFEN 600 MG PO TABS: 600.0000 mg | ORAL_TABLET | Freq: Four times a day (QID) | ORAL | Status: DC | PRN

## 2015-12-15 MED ORDER — MEGESTROL ACETATE 40 MG PO TABS: 40.0000 mg | ORAL_TABLET | Freq: Two times a day (BID) | ORAL | Status: DC

## 2015-12-15 NOTE — Discharge Instructions (Signed)

## 2015-12-15 NOTE — MAU Provider Note (Signed)
History     CSN: [id number]  Arrival date and time: 12/15/15 2024   First Provider Initiated Contact with Patient 12/15/15 2154      Chief Complaint  Patient presents with  . Vaginal Bleeding  . Abdominal Cramping   HPI   Ms.Jordan [name] is a 50 y.o. female G1P1001 presenting to MAU with heavy vaginal bleeding and abdominal cramping. The vaginal bleeding started on February 14th. And she saw her PCP who put her on medication to stop the bleeding. The medication worked. And then started again a few days after she stopped the medication. Her last normal period was 09/13/14  Her PCP is at [facility]; she was seen there twice in the last 2 weeks. She had an US done this past week and was told that US showed an "inflamed vagina". She was referred to the [facility] and has an appointment for an endometrial biopsy on March 9th.   Her abdominal pain is constant and is cramp like. The pain is located in both sides of her lower abdomen. Her bleeding is described as heavy, changing "lots" of pads throughout the day.    OB History    Gravida Para Term Preterm AB TAB SAB Ectopic Multiple Living   1 1 1       1       Past Medical History  Diagnosis Date  . Mental disorder   . Bipolar 1 disorder ([place])   . Bipolar 1 disorder ([place])   . Yeast infection of the vagina   . Bipolar disorder ([place])   . Obesity   . UTI (lower urinary tract infection)   . Alcohol abuse   . Shortness of breath dyspnea   . Anemia   . 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 Cholecystectomy      No family history on file.  Social History  Substance Use Topics  . Smoking status: Never Smoker   . Smokeless tobacco: Never Used  . Alcohol Use: No    Allergies:  Allergies  Allergen Reactions  . Citalopram Other (See Comments)    Possible cause of pancytopenia  . Lamotrigine Other (See Comments)    Possible cause of pancytopenia  .  Tramadol Anaphylaxis, Shortness Of Breath and Swelling  . Sulfa Antibiotics Swelling    Prescriptions prior to admission  Medication Sig Dispense Refill Last Dose  . cephALEXin (KEFLEX) 500 MG capsule Take 1 capsule (500 mg total) by mouth 4 (four) times daily. 28 capsule 0 08/19/2015 at Unknown time  . cyanocobalamin (,VITAMIN B-12,) 1000 MCG/ML injection Inject subcutaneously q Daily till 02/27/15,then, Inject subcutaneously q weekly for 4 weeks from 02/28/15, Then from 03/29/15-inject subcutaneously q monthly and stay on it (Patient taking differently: Inject 1,000 mcg into the skin every 30 (thirty) days. ) 30 mL 0 Past Month at Unknown time  . docusate sodium (COLACE) 100 MG capsule Take 1 capsule (100 mg total) by mouth every 12 (twelve) hours. (Patient not taking: Reported on 05/17/2015) 60 capsule 0 Not Taking at Unknown time  . folic acid (FOLVITE) 1 MG tablet Take 1 tablet (1 mg total) by mouth daily. (Patient not taking: Reported on 08/17/2015) 30 tablet 0 Completed Course at Unknown time  . HYDROcodone-acetaminophen (NORCO) 5-325 MG per tablet Take 1 tablet by mouth every 6 (six) hours as needed. (Patient not taking: Reported on 07/14/2015) 10 tablet 0 Not Taking at Unknown time  .  lurasidone (LATUDA) 40 MG TABS tablet Take 20 mg by mouth daily with breakfast.    08/18/2015 at Unknown time  . nitrofurantoin (MACRODANTIN) 100 MG capsule Take 1 capsule (100 mg total) by mouth 2 (two) times daily. (Patient not taking: Reported on 06/26/2015) 13 capsule 0 Completed Course at Unknown time  . nitrofurantoin, macrocrystal-monohydrate, (MACROBID) 100 MG capsule Take 1 capsule (100 mg total) by mouth 2 (two) times daily. (Patient not taking: Reported on 08/10/2015) 10 capsule 0 Completed Course at Unknown time  . ondansetron (ZOFRAN) 4 MG tablet Take 1 tablet (4 mg total) by mouth every 6 (six) hours. (Patient not taking: Reported on 08/06/2015) 12 tablet 0 Completed Course at Unknown time  . PARoxetine  (PAXIL-CR) 25 MG 24 hr tablet Take 1 tablet (25 mg total) by mouth daily. (Patient not taking: Reported on 04/21/2015) 30 tablet 0 Not Taking at Unknown time  . phenazopyridine (PYRIDIUM) 200 MG tablet Take 1 tablet (200 mg total) by mouth 3 (three) times daily with meals. (Patient not taking: Reported on 07/14/2015) 10 tablet 0 Completed Course at Unknown time  . polyethylene glycol (MIRALAX / GLYCOLAX) packet Take 17 g by mouth daily as needed for mild constipation. (Patient not taking: Reported on 08/06/2015) 14 each 0 Not Taking at Unknown time  . saccharomyces boulardii (FLORASTOR) 250 MG capsule Take 1 capsule (250 mg total) by mouth 2 (two) times daily. (Patient not taking: Reported on 06/26/2015) 60 capsule 2 Not Taking at Unknown time   Results for orders placed or performed during the hospital encounter of 12/15/15 (from the past 48 hour(s))  CBC     Status: None   Collection Time: 12/15/15  9:21 PM  Result Value Ref Range   WBC 7.4 4.0 - 10.5 K/uL    Comment: WHITE COUNT CONFIRMED ON SMEAR   RBC 4.77 3.87 - 5.11 MIL/uL   Hemoglobin 14.6 12.0 - 15.0 g/dL   HCT 41.4 36.0 - 46.0 %   MCV 86.8 78.0 - 100.0 fL   MCH 30.6 26.0 - 34.0 pg   MCHC 35.3 30.0 - 36.0 g/dL   RDW 13.1 11.5 - 15.5 %   Platelets 207 150 - 400 K/uL  Urinalysis, Routine w reflex microscopic (not at [facility])     Status: Abnormal   Collection Time: 12/15/15  9:40 PM  Result Value Ref Range   Color, Urine BROWN (A) YELLOW    Comment: BIOCHEMICALS MAY BE AFFECTED BY COLOR   APPearance TURBID (A) CLEAR   Specific Gravity, Urine 1.025 1.005 - 1.030   pH 6.5 5.0 - 8.0   Glucose, UA NEGATIVE NEGATIVE mg/dL   Hgb urine dipstick LARGE (A) NEGATIVE   Bilirubin Urine NEGATIVE NEGATIVE   Ketones, ur 15 (A) NEGATIVE mg/dL   Protein, ur 100 (A) NEGATIVE mg/dL   Nitrite POSITIVE (A) NEGATIVE   Leukocytes, UA TRACE (A) NEGATIVE  Urine microscopic-add on     Status: Abnormal   Collection Time: 12/15/15  9:40 PM  Result Value Ref  Range   Squamous Epithelial / LPF 0-5 (A) NONE SEEN   WBC, UA 0-5 0 - 5 WBC/hpf   RBC / HPF TOO NUMEROUS TO COUNT 0 - 5 RBC/hpf   Bacteria, UA RARE (A) NONE SEEN  Pregnancy, urine POC     Status: None   Collection Time: 12/15/15  9:51 PM  Result Value Ref Range   Preg Test, Ur NEGATIVE NEGATIVE    Comment:        THE  SENSITIVITY OF THIS METHODOLOGY IS >24 mIU/mL     Review of Systems  Gastrointestinal: Positive for abdominal pain.  Genitourinary: Negative for dysuria, urgency, frequency, hematuria and flank pain.  Neurological: Negative for dizziness and weakness.   Physical Exam   Blood pressure 150/89, pulse 87, temperature 98.4 F (36.9 C), temperature source Oral, resp. rate 18, height 5\' 8"  (1.727 m), weight 333 lb (151.048 kg), last menstrual period 09/13/2014.  Physical Exam  Constitutional: She is oriented to person, place, and time. She appears well-developed and well-nourished. No distress.  HENT:  Head: Normocephalic.  Eyes: Pupils are equal, round, and reactive to light.  Respiratory: Effort normal.  Genitourinary:  Speculum exam: Vagina - Small amount of dark red blood, no odor Cervix - + active blood oozing from the cervix  Bimanual exam: Cervix closed Uterus tender, normal size Adnexa non tender, no masses bilaterally Chaperone present for exam.  Neurological: She is alert and oriented to person, place, and time.  Skin: Skin is warm. She is not diaphoretic.  Psychiatric: Her speech is delayed.    MAU Course  Procedures  None  MDM  CBC UA Toradol 60 mg IM   Assessment and Plan   A:  1. Abnormal vaginal bleeding   2. Post-menopausal bleeding   3. Abdominal cramping     P:  Discharge home in stable condition Bleeding precautions RX: Megace, ibuprofen  Keep your appointment in the [facility].  Return to MAU if symptoms worsen    Lezlie Lye, NP 12/15/2015 10:12 PM

## 2015-12-15 NOTE — MAU Note (Signed)
Patient states she started bleeding last Monday, saw MD, [name] ordered and done last week at [facility] per patient and partner. Got results from [name] yesterday "got something swollen on the inside and they need to check it to make sure its not cancer." Patient states her GYN is [facility] but states it has been a long time since since she has been. Patient states she is changing pads 5 times/day. States she is having cramping and rating it ">10"

## 2015-12-28 ENCOUNTER — Ambulatory Visit (INDEPENDENT_AMBULATORY_CARE_PROVIDER_SITE_OTHER): Payer: Medicaid Other | Admitting: Obstetrics and Gynecology

## 2015-12-28 ENCOUNTER — Other Ambulatory Visit: Payer: Self-pay | Admitting: General Practice

## 2015-12-28 ENCOUNTER — Encounter: Payer: Self-pay | Admitting: Obstetrics and Gynecology

## 2015-12-28 VITALS — BP 126/89 | HR 92 | Temp 98.2°F | Ht 68.0 in | Wt 336.0 lb

## 2015-12-28 DIAGNOSIS — N898 Other specified noninflammatory disorders of vagina: Secondary | ICD-10-CM

## 2015-12-28 DIAGNOSIS — N950 Postmenopausal bleeding: Secondary | ICD-10-CM | POA: Diagnosis present

## 2015-12-28 DIAGNOSIS — N882 Stricture and stenosis of cervix uteri: Secondary | ICD-10-CM | POA: Diagnosis not present

## 2015-12-28 LAB — POCT PREGNANCY, URINE: Preg Test, Ur: NEGATIVE

## 2015-12-28 MED ORDER — MISOPROSTOL 200 MCG PO TABS: ORAL_TABLET | ORAL | Status: DC

## 2015-12-28 MED ORDER — MEGESTROL ACETATE 40 MG PO TABS: 40.0000 mg | ORAL_TABLET | Freq: Every day | ORAL | Status: DC

## 2015-12-28 MED ORDER — MISOPROSTOL 200 MCG PO TABS
ORAL_TABLET | ORAL | Status: DC
Start: 2015-12-28 — End: 2015-12-30

## 2015-12-28 NOTE — Addendum Note (Signed)
Addended by: Shelly Coss on: 12/28/2015 04:53 PM   Modules accepted: Orders

## 2015-12-28 NOTE — Progress Notes (Signed)
Patient ID: Jordan [name], female   DOB: October 04, 1966, 50 y.o.   MRN: [id number] 50 yo G1P1 presenting today for the evaluation of DUB. Patient reports her last menstrual cycle occurrying 10 months ago. She reports experiencing a period in mid February which lasted for over 2 weeks. She described the vaginal bleeding as heavy, often soiling her clothes. She was seen in MAU on 2/24 and was prescribed Megace. She completed her prescription and reports no further episodes of vaginal bleeding. Patient is very concerned that she may have cancer. Patient also reports a foul odor and would like that to be evaluated as well. She denies any abnormal vaginal discharge.  Past Medical History  Diagnosis Date  . Mental disorder   . Bipolar 1 disorder ([place])   . Bipolar 1 disorder ([place])   . Yeast infection of the vagina   . Bipolar disorder ([place])   . Obesity   . UTI (lower urinary tract infection)   . Alcohol abuse   . Shortness of breath dyspnea   . Anemia   . 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 Cholecystectomy     No family history on file. Social History  Substance Use Topics  . Smoking status: Never Smoker   . Smokeless tobacco: Never Used  . Alcohol Use: No   ROS See pertinent in HPI  Blood pressure 126/89, pulse 92, temperature 98.2 F (36.8 C), temperature source Oral, height 5\' 8"  (1.727 m), weight 336 lb (152.409 kg), last menstrual period 12/05/2015. GENERAL: Well-developed, well-nourished female in no acute distress.  ABDOMEN: Soft, nontender, nondistended. No organomegaly. Obese PELVIC: Normal external female genitalia. Vagina is pink and rugated.  Normal discharge. Normal appearing cervix. Bimanual exam limited secondary to body habitus. No adnexal mass or tenderness. EXTREMITIES: No cyanosis, clubbing, or edema, 2+ distal pulses.    A/P 50 yo with DUB - Discussed the benefits of endometrial  biopsy ENDOMETRIAL BIOPSY     The indications for endometrial biopsy were reviewed.   Risks of the biopsy including cramping, bleeding, infection, uterine perforation, inadequate specimen and need for additional procedures  were discussed. The patient states she understands and agrees to undergo procedure today. Consent was signed. Time out was performed. Urine HCG was negative. A sterile speculum was placed in the patient's vagina and the cervix was prepped with Betadine. A single-toothed tenaculum was placed on the anterior lip of the cervix to stabilize it. Due to a stenotic os it was not possible to dilate the cervix to perform the endometrial biopsy. The patient will be scheduled for a D&C with hysteroscopy and will be premedicated with cytotec  - Rx megace provided in the meantime - Risks benefits and alternatives were reviewed with the patient including but not limited to risks of bleeding, infection, uterine perforation and damage to adjacent organs. Patient verbalized understanding

## 2015-12-28 NOTE — Patient Instructions (Signed)
Dilation and Curettage or Vacuum Curettage, Care After Refer to this sheet in the next few weeks. These instructions provide you with information on caring for yourself after your procedure. Your health care provider may also give you more specific instructions. Your treatment has been planned according to current medical practices, but problems sometimes occur. Call your health care provider if you have any problems or questions after your procedure. WHAT TO EXPECT AFTER THE PROCEDURE After your procedure, it is typical to have light cramping and bleeding. This may last for 2 days to 2 weeks after the procedure. HOME CARE INSTRUCTIONS   Do not drive for 24 hours.  Wait 1 week before returning to strenuous activities.  Take your temperature 2 times a day for 4 days and write it down. Provide these temperatures to your health care provider if you develop a fever.  Avoid long periods of standing.  Avoid heavy lifting, pushing, or pulling. Do not lift anything heavier than 10 pounds (4.5 kg).  Limit stair climbing to once or twice a day.  Take rest periods often.  You may resume your usual diet.  Drink enough fluids to keep your urine clear or pale yellow.  Your usual bowel function should return. If you have constipation, you may:  Take a mild laxative with permission from your health care provider.  Add fruit and bran to your diet.  Drink more fluids.  Take showers instead of baths until your health care provider gives you permission to take baths.  Do not go swimming or use a hot tub until your health care provider approves.  Try to have someone with you or available to you the first 24-48 hours, especially if you were given a general anesthetic.  Do not douche, use tampons, or have sex (intercourse) for 2 weeks after the procedure.  Only take over-the-counter or prescription medicines as directed by your health care provider. Do not take aspirin. It can cause  bleeding.  Follow up with your health care provider as directed. SEEK MEDICAL CARE IF:   You have increasing cramps or pain that is not relieved with medicine.  You have abdominal pain that does not seem to be related to the same area of earlier cramping and pain.  You have bad smelling vaginal discharge.  You have a rash.  You are having problems with any medicine. SEEK IMMEDIATE MEDICAL CARE IF:   You have bleeding that is heavier than a normal menstrual period.  You have a fever.  You have chest pain.  You have shortness of breath.  You feel dizzy or feel like fainting.  You pass out.  You have pain in your shoulder strap area.  You have heavy vaginal bleeding with or without blood clots. MAKE SURE YOU:   Understand these instructions.  Will watch your condition.  Will get help right away if you are not doing well or get worse.   This information is not intended to replace advice given to you by your health care provider. Make sure you discuss any questions you have with your health care provider.   Document Released: 10/04/2000 Document Revised: 10/12/2013 Document Reviewed: 05/06/2013 Elsevier Interactive Patient Education 2016 [employer]  

## 2015-12-29 ENCOUNTER — Telehealth: Payer: Self-pay | Admitting: *Deleted

## 2015-12-29 ENCOUNTER — Telehealth: Payer: Self-pay | Admitting: Obstetrics and Gynecology

## 2015-12-29 ENCOUNTER — Encounter (HOSPITAL_COMMUNITY): Payer: Self-pay | Admitting: *Deleted

## 2015-12-29 LAB — WET PREP, GENITAL
Trich, Wet Prep: NONE SEEN
Yeast Wet Prep HPF POC: NONE SEEN

## 2015-12-29 NOTE — Telephone Encounter (Signed)
Want to get her test results from yesterday  You can also reach her on [phone]

## 2015-12-29 NOTE — Telephone Encounter (Signed)
Vicente Males from [facility] called and left a message yesterday afternoon wanting clarification on cytotec quantity/instructions.  Discussed with Dr. Elly Modena and called [facility] and clarified order should say cytotec dispense 4 tablets, take all 4 tablets at once night before surgery.

## 2015-12-30 ENCOUNTER — Encounter (HOSPITAL_COMMUNITY): Payer: Self-pay | Admitting: *Deleted

## 2015-12-30 ENCOUNTER — Inpatient Hospital Stay (HOSPITAL_COMMUNITY)
Admission: AD | Admit: 2015-12-30 | Discharge: 2015-12-30 | Disposition: A | Discharge status: Home / Self Care | Payer: Medicaid Other | Source: Ambulatory Visit | Attending: Obstetrics & Gynecology | Admitting: Obstetrics & Gynecology

## 2015-12-30 DIAGNOSIS — A499 Bacterial infection, unspecified: Secondary | ICD-10-CM

## 2015-12-30 DIAGNOSIS — N3000 Acute cystitis without hematuria: Secondary | ICD-10-CM

## 2015-12-30 DIAGNOSIS — B9689 Other specified bacterial agents as the cause of diseases classified elsewhere: Secondary | ICD-10-CM

## 2015-12-30 DIAGNOSIS — Z882 Allergy status to sulfonamides status: Secondary | ICD-10-CM | POA: Diagnosis not present

## 2015-12-30 DIAGNOSIS — Z888 Allergy status to other drugs, medicaments and biological substances status: Secondary | ICD-10-CM | POA: Insufficient documentation

## 2015-12-30 DIAGNOSIS — N760 Acute vaginitis: Secondary | ICD-10-CM | POA: Diagnosis not present

## 2015-12-30 DIAGNOSIS — N882 Stricture and stenosis of cervix uteri: Secondary | ICD-10-CM | POA: Insufficient documentation

## 2015-12-30 DIAGNOSIS — N898 Other specified noninflammatory disorders of vagina: Secondary | ICD-10-CM | POA: Diagnosis present

## 2015-12-30 DIAGNOSIS — N939 Abnormal uterine and vaginal bleeding, unspecified: Secondary | ICD-10-CM

## 2015-12-30 DIAGNOSIS — E669 Obesity, unspecified: Secondary | ICD-10-CM | POA: Diagnosis not present

## 2015-12-30 DIAGNOSIS — Z79899 Other long term (current) drug therapy: Secondary | ICD-10-CM | POA: Diagnosis not present

## 2015-12-30 DIAGNOSIS — N390 Urinary tract infection, site not specified: Secondary | ICD-10-CM | POA: Diagnosis not present

## 2015-12-30 LAB — URINE MICROSCOPIC-ADD ON

## 2015-12-30 LAB — URINALYSIS, ROUTINE W REFLEX MICROSCOPIC
Bilirubin Urine: NEGATIVE
Glucose, UA: NEGATIVE mg/dL
Ketones, ur: NEGATIVE mg/dL
Nitrite: POSITIVE — AB
Protein, ur: NEGATIVE mg/dL
Specific Gravity, Urine: 1.02 (ref 1.005–1.030)
pH: 6 (ref 5.0–8.0)

## 2015-12-30 MED ORDER — CIPROFLOXACIN HCL 250 MG PO TABS: 250.0000 mg | ORAL_TABLET | Freq: Two times a day (BID) | ORAL | Status: DC

## 2015-12-30 MED ORDER — MISOPROSTOL 200 MCG PO TABS: 800.0000 ug | ORAL_TABLET | Freq: Once | ORAL | Status: DC

## 2015-12-30 NOTE — Discharge Instructions (Signed)
Asymptomatic Bacteriuria, Female Asymptomatic bacteriuria is the presence of a large number of bacteria in your urine without the usual symptoms of burning or frequent urination. The following conditions increase the risk of asymptomatic bacteriuria:  Diabetes mellitus.  Advanced age.  Pregnancy in the first trimester.  Kidney stones.  Kidney transplants.  Leaky kidney tube valve in young children (reflux). Treatment for this condition is not needed in most people and can lead to other problems such as too much yeast and growth of resistant bacteria. However, some people, such as pregnant women, do need treatment to prevent kidney infection. Asymptomatic bacteriuria in pregnancy is also associated with fetal growth restriction, premature labor, and newborn death. HOME CARE INSTRUCTIONS Monitor your condition for any changes. The following actions may help to relieve any discomfort you are feeling:  Drink enough water and fluids to keep your urine clear or pale yellow. Go to the bathroom more often to keep your bladder empty.  Keep the area around your vagina and rectum clean. Wipe yourself from front to back after urinating. SEEK IMMEDIATE MEDICAL CARE IF:  You develop signs of an infection such as:  Burning with urination.  Frequency of voiding.  Back pain.  Fever.  You have blood in the urine.  You develop a fever. MAKE SURE YOU:  Understand these instructions.  Will watch your condition.  Will get help right away if you are not doing well or get worse.   This information is not intended to replace advice given to you by your health care provider. Make sure you discuss any questions you have with your health care provider.   Document Released: 10/07/2005 Document Revised: 10/28/2014 Document Reviewed: 03/29/2013 Elsevier Interactive Patient Education 2016 Elsevier Inc. Bacterial Vaginosis Bacterial vaginosis is an infection of the vagina. It happens when too many  germs (bacteria) grow in the vagina. Having this infection puts you at risk for getting other infections from sex. Treating this infection can help lower your risk for other infections, such as:   Chlamydia.  Gonorrhea.  HIV.  Herpes. HOME CARE  Take your medicine as told by your doctor.  Finish your medicine even if you start to feel better.  Tell your sex partner that you have an infection. They should see their doctor for treatment.  During treatment:  Avoid sex or use condoms correctly.  Do not douche.  Do not drink alcohol unless your doctor tells you it is ok.  Do not breastfeed unless your doctor tells you it is ok. GET HELP IF:  You are not getting better after 3 days of treatment.  You have more grey fluid (discharge) coming from your vagina than before.  You have more pain than before.  You have a fever. MAKE SURE YOU:   Understand these instructions.  Will watch your condition.  Will get help right away if you are not doing well or get worse.   This information is not intended to replace advice given to you by your health care provider. Make sure you discuss any questions you have with your health care provider.   Document Released: 07/16/2008 Document Revised: 10/28/2014 Document Reviewed: 05/19/2013 Elsevier Interactive Patient Education [employer].

## 2015-12-30 NOTE — MAU Provider Note (Signed)
History     CSN: [id number]  Arrival date and time: 12/30/15 1752   First Provider Initiated Contact with Patient 12/30/15 1842      Chief Complaint  Patient presents with  . Vaginal Discharge   HPI  [name] is a 50 y.o. G1P1001 who presents to MAU today with complaint of vaginal odor. The patient was seen in [place] last week. She has been diagnosed with AUB and they were unable to perform endometrial biopsy due to cervical stenosis. The patient is to be scheduled for endometrial biopsy again soon with pre-operative cytotec. The patient states that the Rx for Cytotec was not sent correctly and she needs to me to give it to her with the correct instructions today. She also has Rx for Flagyl, but stopped taking it because she hadn't noted improvement. She denies UTI symptoms, fever or vaginal bleeding today. She also has Rx for Megace, but is unsure if she needs to keep taking that. Basically, the patient has no new complaints today, but was unclear of instructions from her office visit.   OB History    Gravida Para Term Preterm AB TAB SAB Ectopic Multiple Living   1 1 1       1       Past Medical History  Diagnosis Date  . Mental disorder   . Bipolar 1 disorder ([place])   . Bipolar 1 disorder ([place])   . Yeast infection of the vagina   . Bipolar disorder ([place])   . Obesity   . UTI (lower urinary tract infection)   . Alcohol abuse   . Shortness of breath dyspnea   . Anemia   . 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 Cholecystectomy      History reviewed. No pertinent family history.  Social History  Substance Use Topics  . Smoking status: Never Smoker   . Smokeless tobacco: Never Used  . Alcohol Use: No    Allergies:  Allergies  Allergen Reactions  . Citalopram Other (See Comments)    Possible cause of pancytopenia.  . Lamotrigine Other (See Comments)    Possible cause of pancytopenia.   . Tramadol Anaphylaxis, Shortness Of Breath and Swelling  . Sulfa Antibiotics Swelling    Prescriptions prior to admission  Medication Sig Dispense Refill Last Dose  . cyanocobalamin (,VITAMIN B-12,) 1000 MCG/ML injection Inject 1,000 mcg into the muscle every 30 (thirty) days. Pt gets on the 8th of every month.   11/29/2015 at unknown   . megestrol (MEGACE) 40 MG tablet Take 40 mg by mouth 2 (two) times daily.   12/30/2015 at Unknown time  . metroNIDAZOLE (FLAGYL) 500 MG tablet Take 500 mg by mouth 2 (two) times daily.   12/30/2015 at Unknown time  . misoprostol (CYTOTEC) 200 MCG tablet Take 800 mcg by mouth once. Pt is to take the night before surgery.       Review of Systems  Constitutional: Negative for fever and malaise/fatigue.  Gastrointestinal: Negative for nausea, vomiting, abdominal pain, diarrhea and constipation.  Genitourinary: Negative for dysuria, urgency and frequency.       Neg - vaginal bleeding + vaginal discharge   Physical Exam   Blood pressure 117/56, pulse 114, temperature 97.8 F (36.6 C), temperature source Oral, last menstrual period 12/05/2015.  Physical Exam  Nursing note and vitals reviewed. Constitutional: She is oriented to person, place, and time. She appears well-developed and  well-nourished. No distress.  HENT:  Head: Normocephalic and atraumatic.  Cardiovascular: Normal rate.   Respiratory: Effort normal.  GI: Soft. She exhibits no distension and no mass. There is no tenderness. There is no rebound and no guarding.  Neurological: She is alert and oriented to person, place, and time.  Skin: Skin is warm and dry. No erythema.  Psychiatric: She has a normal mood and affect.   Results for orders placed or performed during the hospital encounter of 12/30/15 (from the past 24 hour(s))  Urinalysis, Routine w reflex microscopic (not at [facility])     Status: Abnormal   Collection Time: 12/30/15  6:00 PM  Result Value Ref Range   Color, Urine YELLOW YELLOW    APPearance CLEAR CLEAR   Specific Gravity, Urine 1.020 1.005 - 1.030   pH 6.0 5.0 - 8.0   Glucose, UA NEGATIVE NEGATIVE mg/dL   Hgb urine dipstick SMALL (A) NEGATIVE   Bilirubin Urine NEGATIVE NEGATIVE   Ketones, ur NEGATIVE NEGATIVE mg/dL   Protein, ur NEGATIVE NEGATIVE mg/dL   Nitrite POSITIVE (A) NEGATIVE   Leukocytes, UA TRACE (A) NEGATIVE  Urine microscopic-add on     Status: Abnormal   Collection Time: 12/30/15  6:00 PM  Result Value Ref Range   Squamous Epithelial / LPF 0-5 (A) NONE SEEN   WBC, UA 6-30 0 - 5 WBC/hpf   RBC / HPF 0-5 0 - 5 RBC/hpf   Bacteria, UA FEW (A) NONE SEEN    MAU Course  Procedures None  MDM Reviewed labs from this weeks visit in [place] [facility] today All patient questions answered at this time.  Urine culture pending Assessment and Plan  A: UTI AUB BV  P: Discharge home Rx for Cipro given to patient Rx for Cytotec given again, with appropriate instruction at the patient's request Bleeding precautions discussed Patient advised to follow-up with [facility] as scheduled Patient may return to MAU as needed or if her condition were to change or worsen   Luvenia Redden, PA-C  12/30/2015, 6:42 PM

## 2015-12-30 NOTE — MAU Note (Signed)
Pt reports she is having an odor and discharge for about 3 weeks. Went to office on THursday and she told the doctor but she did nothing about it according to pt.

## 2015-12-30 NOTE — MAU Note (Signed)
Pt was prescribed metronidazole last month. Still has pills from a weeks dose. Stopped taling them because "they were not working."  [employer] prep from this week is positive for clue cells.

## 2016-01-01 ENCOUNTER — Telehealth: Payer: Self-pay

## 2016-01-01 LAB — URINE CULTURE

## 2016-01-01 NOTE — Telephone Encounter (Signed)
Zylynn also left a  Voicemail message early this am. I called Jordan [name] back and gave her the results she requested which was her wet prep. She is already taking flagyl and I instructed her to complete all of that. She also wanted to review when her surgery is and I did that. I also answered her questions about BV and UTI.

## 2016-01-04 ENCOUNTER — Encounter (HOSPITAL_COMMUNITY): Payer: Self-pay | Admitting: *Deleted

## 2016-01-04 ENCOUNTER — Inpatient Hospital Stay (HOSPITAL_COMMUNITY): Payer: Medicaid Other

## 2016-01-04 ENCOUNTER — Inpatient Hospital Stay (HOSPITAL_COMMUNITY)
Admission: AD | Admit: 2016-01-04 | Discharge: 2016-01-04 | Disposition: A | Discharge status: Home / Self Care | Payer: Medicaid Other | Source: Ambulatory Visit | Attending: Obstetrics and Gynecology | Admitting: Obstetrics and Gynecology

## 2016-01-04 DIAGNOSIS — R102 Pelvic and perineal pain: Secondary | ICD-10-CM | POA: Insufficient documentation

## 2016-01-04 LAB — URINALYSIS, ROUTINE W REFLEX MICROSCOPIC
Bilirubin Urine: NEGATIVE
Glucose, UA: NEGATIVE mg/dL
Ketones, ur: 15 mg/dL — AB
Nitrite: POSITIVE — AB
Protein, ur: NEGATIVE mg/dL
Specific Gravity, Urine: 1.025 (ref 1.005–1.030)
pH: 5.5 (ref 5.0–8.0)

## 2016-01-04 LAB — WET PREP, GENITAL
Clue Cells Wet Prep HPF POC: NONE SEEN
Sperm: NONE SEEN
Trich, Wet Prep: NONE SEEN
Yeast Wet Prep HPF POC: NONE SEEN

## 2016-01-04 LAB — URINE MICROSCOPIC-ADD ON

## 2016-01-04 MED ORDER — IBUPROFEN 800 MG PO TABS: 800.0000 mg | ORAL_TABLET | Freq: Three times a day (TID) | ORAL | Status: DC | PRN

## 2016-01-04 MED ORDER — KETOROLAC TROMETHAMINE 60 MG/2ML IM SOLN
60.0000 mg | Freq: Once | INTRAMUSCULAR | Status: AC
  Administered 2016-01-04: 60 mg via INTRAMUSCULAR
  Filled 2016-01-04: qty 2

## 2016-01-04 NOTE — MAU Provider Note (Signed)
History     CSN: [id number]  Arrival date and time: 01/04/16 2027   First Provider Initiated Contact with Patient 01/04/16 2109      Chief Complaint  Patient presents with  . Pelvic Pain   Pelvic Pain The patient's primary symptoms include pelvic pain. This is a new problem. Episode onset: 2 weeks  The problem occurs constantly. The problem has been gradually worsening. Pain severity now: 10/10  The problem affects both sides. She is not pregnant. Associated symptoms include abdominal pain. Pertinent negatives include no chills, constipation, diarrhea, dysuria, fever, frequency, nausea, urgency or vomiting. Nothing aggravates the symptoms. She has tried nothing for the symptoms. She is sexually active. It is unknown whether or not her partner has an STD. She uses nothing for contraception. Her menstrual history has been irregular (perimenopausal ).    Past Medical History  Diagnosis Date  . Mental disorder   . Bipolar 1 disorder ([place])   . Bipolar 1 disorder ([place])   . Yeast infection of the vagina   . Bipolar disorder ([place])   . Obesity   . UTI (lower urinary tract infection)   . Alcohol abuse   . Shortness of breath dyspnea   . Anemia   . 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 Cholecystectomy      History reviewed. No pertinent family history.  Social History  Substance Use Topics  . Smoking status: Never Smoker   . Smokeless tobacco: Never Used  . Alcohol Use: No    Allergies:  Allergies  Allergen Reactions  . Citalopram Other (See Comments)    Possible cause of pancytopenia.  . Lamotrigine Other (See Comments)    Possible cause of pancytopenia.  . Tramadol Anaphylaxis, Shortness Of Breath and Swelling  . Sulfa Antibiotics Swelling    Prescriptions prior to admission  Medication Sig Dispense Refill Last Dose  . ciprofloxacin (CIPRO) 250 MG tablet Take 1 tablet (250 mg total) by mouth  every 12 (twelve) hours. 10 tablet 0   . cyanocobalamin (,VITAMIN B-12,) 1000 MCG/ML injection Inject 1,000 mcg into the muscle every 30 (thirty) days. Pt gets on the 8th of every month.   11/29/2015 at unknown   . metroNIDAZOLE (FLAGYL) 500 MG tablet Take 500 mg by mouth 2 (two) times daily.   12/30/2015 at Unknown time  . misoprostol (CYTOTEC) 200 MCG tablet Take 4 tablets (800 mcg total) by mouth once. Pt is to take the night before surgery. 4 tablet 0     Review of Systems  Constitutional: Negative for fever and chills.  Gastrointestinal: Positive for abdominal pain. Negative for nausea, vomiting, diarrhea and constipation.  Genitourinary: Positive for pelvic pain. Negative for dysuria, urgency and frequency.   Physical Exam   Blood pressure 119/83, pulse 97, temperature 97.8 F (36.6 C), temperature source Oral, resp. rate 20, height 5\' 8"  (1.727 m), weight 151.955 kg (335 lb), last menstrual period 12/05/2015.  Physical Exam  Nursing note and vitals reviewed. Constitutional: She is oriented to person, place, and time. She appears well-developed and well-nourished. No distress.  HENT:  Head: Normocephalic.  Cardiovascular: Normal rate.   Respiratory: Effort normal.  GI: Soft. There is no tenderness. There is no rebound.  Neurological: She is alert and oriented to person, place, and time.  Skin: Skin is warm and dry.  Psychiatric: She has a normal mood and affect.   Results for orders placed or  performed during the hospital encounter of 01/04/16 (from the past 24 hour(s))  Urinalysis, Routine w reflex microscopic (not at [facility])     Status: Abnormal   Collection Time: 01/04/16  8:42 PM  Result Value Ref Range   Color, Urine YELLOW YELLOW   APPearance CLEAR CLEAR   Specific Gravity, Urine 1.025 1.005 - 1.030   pH 5.5 5.0 - 8.0   Glucose, UA NEGATIVE NEGATIVE mg/dL   Hgb urine dipstick TRACE (A) NEGATIVE   Bilirubin Urine NEGATIVE NEGATIVE   Ketones, ur 15 (A) NEGATIVE mg/dL    Protein, ur NEGATIVE NEGATIVE mg/dL   Nitrite POSITIVE (A) NEGATIVE   Leukocytes, UA SMALL (A) NEGATIVE  Urine microscopic-add on     Status: Abnormal   Collection Time: 01/04/16  8:42 PM  Result Value Ref Range   Squamous Epithelial / LPF 0-5 (A) NONE SEEN   WBC, UA 0-5 0 - 5 WBC/hpf   RBC / HPF 0-5 0 - 5 RBC/hpf   Bacteria, UA FEW (A) NONE SEEN  Wet prep, genital     Status: Abnormal   Collection Time: 01/04/16  9:15 PM  Result Value Ref Range   Yeast Wet Prep HPF POC NONE SEEN NONE SEEN   Trich, Wet Prep NONE SEEN NONE SEEN   Clue Cells Wet Prep HPF POC NONE SEEN NONE SEEN   WBC, Wet Prep HPF POC FEW (A) NONE SEEN   Sperm NONE SEEN    US Pelvis Complete  01/04/2016  CLINICAL DATA:  Menopausal bleeding. Plan for hysteroscopy in April. EXAM: TRANSABDOMINAL AND TRANSVAGINAL ULTRASOUND OF PELVIS DOPPLER ULTRASOUND OF OVARIES TECHNIQUE: Both transabdominal and transvaginal ultrasound examinations of the pelvis were performed. Transabdominal technique was performed for global imaging of the pelvis including uterus, ovaries, adnexal regions, and pelvic cul-de-sac. It was necessary to proceed with endovaginal exam following the transabdominal exam to visualize the endometrium. Color and duplex Doppler ultrasound was utilized to evaluate blood flow to the ovaries. COMPARISON:  12/18/2015 sonography report reviewedin Care everywhere FINDINGS: Uterus Measurements: 9 x 5 x 6 cm. No fibroids or other mass visualized. Endometrium Thickness: 12 mm. Endometrium is mildly inhomogeneous. Small and simple sub endometrial cysts are occasionally seen. No focal mass lesion identified. Right ovary Measurements: 21 x 20 x 18 mm. Normal appearance/no adnexal mass. Left ovary Measurements: 27 x 19 x 28 mm. Normal appearance/no adnexal mass. Pulsed Doppler evaluation of left ovary demonstrates normal low-resistance arterial and venous waveforms. Right ovarian Doppler was challenging due to positioning. Right arterial  waveforms appear high resistance but are low amplitude - fortunately right ovarian venous flow is documented and there is no morphologic changes of torsion. Other findings No abnormal free fluid. IMPRESSION: 1. No acute finding. 2. 12 mm endometrial thickness. Reportedly hysteroscopy has already been scheduled. 3. Sub endometrial cysts compatible with adenomyosis. Electronically Signed   By: Monte Fantasia M.D.   On: 01/04/2016 23:07    MAU Course  Procedures  MDM Patient reports that her pain is now 0/10 after toradol.   Assessment and Plan   1. Pelvic pain in female    DC home Comfort measures reviewed  RX: ibuprofen 800mg  PRN #[id number]  Return to MAU as needed FU with GYN as planned  Follow-up Information    Follow up with [facility].   Specialty:  Obstetrics and Gynecology   Why:  As needed   Contact information:   [address] [place] [phone]  Mathis Bud 01/04/2016, 9:10 PM

## 2016-01-04 NOTE — Discharge Instructions (Signed)

## 2016-01-04 NOTE — MAU Note (Signed)
Patient says she has been cramping over two weeks.

## 2016-01-05 LAB — GC/CHLAMYDIA PROBE AMP (~~LOC~~) NOT AT ARMC
Chlamydia: NEGATIVE
Neisseria Gonorrhea: NEGATIVE

## 2016-01-18 ENCOUNTER — Telehealth: Payer: Self-pay

## 2016-01-18 MED ORDER — METRONIDAZOLE 500 MG PO TABS: 500.0000 mg | ORAL_TABLET | Freq: Two times a day (BID) | ORAL | Status: DC

## 2016-01-18 NOTE — Telephone Encounter (Signed)
Pt called and stated that she is having a fishy odor with discharge. I explained to the pt that she may have BV. I explained to the patient what it is.  I informed pt to please take antibiotic, Flagyl, twice a day for seven days.  Pt stated understanding.

## 2016-01-18 NOTE — Addendum Note (Signed)
Addended by: Michel Harrow on: 01/18/2016 12:15 PM   Modules accepted: Orders

## 2016-01-18 NOTE — Telephone Encounter (Signed)
Patient called has had discharge with a fishy odor for about a week her [facility] [address] in [place]. She would like to speak to someone about it if all possible today, thanks.

## 2016-01-21 NOTE — Anesthesia Preprocedure Evaluation (Addendum)
Anesthesia Evaluation  Patient identified by MRN, date of birth, ID band Patient awake    Reviewed: Allergy & Precautions, NPO status , Patient's Chart, lab work & pertinent test results  Airway Mallampati: II   Neck ROM: Full    Dental  (+) Edentulous Upper, Poor Dentition   Pulmonary neg pulmonary ROS,    breath sounds clear to auscultation       Cardiovascular negative cardio ROS   Rhythm:Regular  ECHO 02/2015 EF 65% valves normal,  Grade II diastolic dysfunction   Neuro/Psych Anxiety Depression Bipolar Disorder negative neurological ROS  negative psych ROS   GI/Hepatic negative GI ROS, Neg liver ROS,   Endo/Other  Morbid obesityBMI 50  Renal/GU negative Renal ROS  negative genitourinary   Musculoskeletal negative musculoskeletal ROS (+)   Abdominal (+) + obese,   Peds negative pediatric ROS (+)  Hematology negative hematology ROS (+)   Anesthesia Other Findings   Reproductive/Obstetrics negative OB ROS                            Anesthesia Physical Anesthesia Plan  ASA: III  Anesthesia Plan: General   Post-op Pain Management:    Induction: Intravenous  Airway Management Planned: Video Laryngoscope Planned and Oral ETT  Additional Equipment:   Intra-op Plan:   Post-operative Plan: Extubation in OR  Informed Consent: I have reviewed the patients History and Physical, chart, labs and discussed the procedure including the risks, benefits and alternatives for the proposed anesthesia with the patient or authorized representative who has indicated his/her understanding and acceptance.     Plan Discussed with:   Anesthesia Plan Comments:         Anesthesia Quick Evaluation

## 2016-01-24 NOTE — H&P (Signed)
Lurena Sluyter is an 50 y.o. female G1P1 presenting today for scheduled D&C with hysteroscopy. Patient has been amenorrheic for 10 months and experienced a period lasing 2 weeks. An endometrial biopsy was attempted in the office but failed due to cervical stenosis. Patient has been medically managed with Megace. She denies any further episodes of vaginal bleeding and has not been using the Megace thus far.  Pertinent Gynecological History: Menses: oligomenorrheic Bleeding: dysfunctional uterine bleeding Contraception: none DES exposure: denies Blood transfusions: none Sexually transmitted diseases: no past history Previous GYN Procedures: none  Last mammogram: normal Date: 10/2015 OB History: G1, P1   Menstrual History: Patient's last menstrual period was 12/05/2015.    Past Medical History  Diagnosis Date  . Mental disorder   . Bipolar 1 disorder ([place])   . Bipolar 1 disorder ([place])   . Yeast infection of the vagina   . Bipolar disorder ([place])   . Obesity   . UTI (lower urinary tract infection)   . Alcohol abuse   . Shortness of breath dyspnea   . Anemia   . 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 Cholecystectomy      History reviewed. No pertinent family history.  Social History:  reports that she has never smoked. She has never used smokeless tobacco. She reports that she does not drink alcohol or use illicit drugs.  Allergies:  Allergies  Allergen Reactions  . Citalopram Other (See Comments)    Possible cause of pancytopenia.  . Lamotrigine Other (See Comments)    Possible cause of pancytopenia.  . Sulfa Antibiotics Anaphylaxis  . Tramadol Anaphylaxis, Shortness Of Breath and Swelling    Prescriptions prior to admission  Medication Sig Dispense Refill Last Dose  . acetaminophen (TYLENOL) 500 MG tablet Take 1,000 mg by mouth every 6 (six) hours as needed for moderate pain.   Past Week at  Unknown time  . misoprostol (CYTOTEC) 200 MCG tablet Take 4 tablets (800 mcg total) by mouth once. Pt is to take the night before surgery. 4 tablet 0 01/24/2016 at 2200  . ibuprofen (ADVIL,MOTRIN) 800 MG tablet Take 1 tablet (800 mg total) by mouth every 8 (eight) hours as needed. (Patient not taking: Reported on 01/23/2016) 30 tablet 0 Not Taking at Unknown time  . metroNIDAZOLE (FLAGYL) 500 MG tablet Take 1 tablet (500 mg total) by mouth 2 (two) times daily. (Patient not taking: Reported on 01/23/2016) 14 tablet 0 Completed Course at Unknown time    Review of Systems  All other systems reviewed and are negative.   Pulse 100, temperature 98.2 F (36.8 C), temperature source Oral, resp. rate 20, height 5\' 8"  (1.727 m), weight 346 lb (156.945 kg), last menstrual period 12/05/2015, SpO2 100 %. Physical Exam GENERAL: Well-developed, well-nourished female in no acute distress.  HEENT: Normocephalic, atraumatic. Sclerae anicteric.  NECK: Supple. Normal thyroid.  LUNGS: Clear to auscultation bilaterally.  HEART: Regular rate and rhythm. ABDOMEN: Soft, nontender, nondistended. Obese PELVIC: Deferred to OR EXTREMITIES: No cyanosis, clubbing, or edema, 2+ distal pulses.  Results for orders placed or performed during the hospital encounter of 01/25/16 (from the past 24 hour(s))  CBC     Status: Abnormal   Collection Time: 01/25/16  8:26 AM  Result Value Ref Range   WBC 8.7 4.0 - 10.5 K/uL   RBC 5.24 (H) 3.87 - 5.11 MIL/uL   Hemoglobin 15.7 (H) 12.0 - 15.0 g/dL  HCT 44.9 36.0 - 46.0 %   MCV 85.7 78.0 - 100.0 fL   MCH 30.0 26.0 - 34.0 pg   MCHC 35.0 30.0 - 36.0 g/dL   RDW 13.4 11.5 - 15.5 %   Platelets 212 150 - 400 K/uL  Pregnancy, urine     Status: None   Collection Time: 01/25/16  8:47 AM  Result Value Ref Range   Preg Test, Ur NEGATIVE NEGATIVE    No results found.  Assessment/Plan: 50 yo with DUB here for D&C hysteroscopy - Risks, benefits and alternatives were explained including  but not limited to risks of bleeding, infection, uterine perforation and damage to adjacent organs. Patient verbalized understanding and all questions were answered  Jessenia Filippone 01/25/2016, 9:16 AM

## 2016-01-25 ENCOUNTER — Ambulatory Visit (HOSPITAL_COMMUNITY)
Admission: RE | Admit: 2016-01-25 | Discharge: 2016-01-25 | Disposition: A | Discharge status: Home / Self Care | Payer: Medicaid Other | Source: Ambulatory Visit | Attending: Obstetrics and Gynecology | Admitting: Obstetrics and Gynecology

## 2016-01-25 ENCOUNTER — Ambulatory Visit (HOSPITAL_COMMUNITY): Payer: Medicaid Other | Admitting: Anesthesiology

## 2016-01-25 ENCOUNTER — Encounter (HOSPITAL_COMMUNITY): Payer: Self-pay | Admitting: Anesthesiology

## 2016-01-25 ENCOUNTER — Encounter (HOSPITAL_COMMUNITY)
Admission: RE | Disposition: A | Discharge status: Home / Self Care | Payer: Self-pay | Source: Ambulatory Visit | Attending: Obstetrics and Gynecology

## 2016-01-25 DIAGNOSIS — F419 Anxiety disorder, unspecified: Secondary | ICD-10-CM | POA: Diagnosis not present

## 2016-01-25 DIAGNOSIS — N938 Other specified abnormal uterine and vaginal bleeding: Secondary | ICD-10-CM | POA: Diagnosis present

## 2016-01-25 DIAGNOSIS — Z9049 Acquired absence of other specified parts of digestive tract: Secondary | ICD-10-CM | POA: Diagnosis not present

## 2016-01-25 DIAGNOSIS — Z79899 Other long term (current) drug therapy: Secondary | ICD-10-CM | POA: Insufficient documentation

## 2016-01-25 DIAGNOSIS — Z888 Allergy status to other drugs, medicaments and biological substances status: Secondary | ICD-10-CM | POA: Insufficient documentation

## 2016-01-25 DIAGNOSIS — Z886 Allergy status to analgesic agent status: Secondary | ICD-10-CM | POA: Insufficient documentation

## 2016-01-25 DIAGNOSIS — F319 Bipolar disorder, unspecified: Secondary | ICD-10-CM | POA: Insufficient documentation

## 2016-01-25 DIAGNOSIS — Z882 Allergy status to sulfonamides status: Secondary | ICD-10-CM | POA: Diagnosis not present

## 2016-01-25 DIAGNOSIS — Z6841 Body Mass Index (BMI) 40.0 and over, adult: Secondary | ICD-10-CM | POA: Insufficient documentation

## 2016-01-25 DIAGNOSIS — N840 Polyp of corpus uteri: Secondary | ICD-10-CM | POA: Diagnosis not present

## 2016-01-25 HISTORY — PX: HYSTEROSCOPY WITH D & C: SHX1775

## 2016-01-25 LAB — CBC
HCT: 44.9 % (ref 36.0–46.0)
Hemoglobin: 15.7 g/dL — ABNORMAL HIGH (ref 12.0–15.0)
MCH: 30 pg (ref 26.0–34.0)
MCHC: 35 g/dL (ref 30.0–36.0)
MCV: 85.7 fL (ref 78.0–100.0)
Platelets: 212 10*3/uL (ref 150–400)
RBC: 5.24 MIL/uL — ABNORMAL HIGH (ref 3.87–5.11)
RDW: 13.4 % (ref 11.5–15.5)
WBC: 8.7 10*3/uL (ref 4.0–10.5)

## 2016-01-25 LAB — PREGNANCY, URINE: Preg Test, Ur: NEGATIVE

## 2016-01-25 SURGERY — DILATATION AND CURETTAGE /HYSTEROSCOPY
Anesthesia: General | Site: Vagina

## 2016-01-25 MED ORDER — PROPOFOL 10 MG/ML IV BOLUS
INTRAVENOUS | Status: AC
  Filled 2016-01-25: qty 20

## 2016-01-25 MED ORDER — CHLOROPROCAINE HCL 1 % IJ SOLN
INTRAMUSCULAR | Status: DC | PRN
  Administered 2016-01-25: 10 mL

## 2016-01-25 MED ORDER — OXYCODONE-ACETAMINOPHEN 5-325 MG PO TABS: 1.0000 | ORAL_TABLET | ORAL | Status: DC | PRN

## 2016-01-25 MED ORDER — FENTANYL CITRATE (PF) 100 MCG/2ML IJ SOLN: 100.0000 ug | Freq: Once | INTRAMUSCULAR | Status: DC

## 2016-01-25 MED ORDER — IBUPROFEN 600 MG PO TABS: 600.0000 mg | ORAL_TABLET | Freq: Four times a day (QID) | ORAL | Status: DC | PRN

## 2016-01-25 MED ORDER — DEXAMETHASONE SODIUM PHOSPHATE 10 MG/ML IJ SOLN
INTRAMUSCULAR | Status: AC
  Filled 2016-01-25: qty 1

## 2016-01-25 MED ORDER — MIDAZOLAM HCL 2 MG/2ML IJ SOLN
INTRAMUSCULAR | Status: AC
  Filled 2016-01-25: qty 2

## 2016-01-25 MED ORDER — DEXAMETHASONE SODIUM PHOSPHATE 4 MG/ML IJ SOLN
INTRAMUSCULAR | Status: DC | PRN
  Administered 2016-01-25: 10 mg via INTRAVENOUS

## 2016-01-25 MED ORDER — FENTANYL CITRATE (PF) 100 MCG/2ML IJ SOLN
INTRAMUSCULAR | Status: AC
  Filled 2016-01-25: qty 2

## 2016-01-25 MED ORDER — SUCCINYLCHOLINE CHLORIDE 20 MG/ML IJ SOLN
INTRAMUSCULAR | Status: DC | PRN
  Administered 2016-01-25: 120 mg via INTRAVENOUS

## 2016-01-25 MED ORDER — ONDANSETRON HCL 4 MG/2ML IJ SOLN
INTRAMUSCULAR | Status: AC
  Filled 2016-01-25: qty 2

## 2016-01-25 MED ORDER — KETOROLAC TROMETHAMINE 30 MG/ML IJ SOLN
INTRAMUSCULAR | Status: DC | PRN
  Administered 2016-01-25: 30 mg via INTRAVENOUS

## 2016-01-25 MED ORDER — SUCCINYLCHOLINE CHLORIDE 20 MG/ML IJ SOLN
INTRAMUSCULAR | Status: AC
  Filled 2016-01-25: qty 1

## 2016-01-25 MED ORDER — MIDAZOLAM HCL 5 MG/5ML IJ SOLN
INTRAMUSCULAR | Status: DC | PRN
  Administered 2016-01-25: 2 mg via INTRAVENOUS

## 2016-01-25 MED ORDER — ONDANSETRON HCL 4 MG/2ML IJ SOLN
INTRAMUSCULAR | Status: DC | PRN
Start: 2016-01-25 — End: 2016-01-25
  Administered 2016-01-25: 4 mg via INTRAVENOUS

## 2016-01-25 MED ORDER — SCOPOLAMINE 1 MG/3DAYS TD PT72: 1.0000 | MEDICATED_PATCH | Freq: Once | TRANSDERMAL | Status: DC

## 2016-01-25 MED ORDER — PROPOFOL 10 MG/ML IV BOLUS
INTRAVENOUS | Status: DC | PRN
  Administered 2016-01-25: 200 mg via INTRAVENOUS

## 2016-01-25 MED ORDER — LIDOCAINE HCL (CARDIAC) 20 MG/ML IV SOLN
INTRAVENOUS | Status: AC
  Filled 2016-01-25: qty 5

## 2016-01-25 MED ORDER — LACTATED RINGERS IV SOLN
INTRAVENOUS | Status: DC
  Administered 2016-01-25 (×2): via INTRAVENOUS

## 2016-01-25 MED ORDER — KETOROLAC TROMETHAMINE 30 MG/ML IJ SOLN
INTRAMUSCULAR | Status: AC
  Filled 2016-01-25: qty 1

## 2016-01-25 MED ORDER — MEPERIDINE HCL 25 MG/ML IJ SOLN: 6.2500 mg | INTRAMUSCULAR | Status: DC | PRN

## 2016-01-25 MED ORDER — FENTANYL CITRATE (PF) 100 MCG/2ML IJ SOLN
INTRAMUSCULAR | Status: DC | PRN
  Administered 2016-01-25 (×2): 50 ug via INTRAVENOUS

## 2016-01-25 MED ORDER — LIDOCAINE HCL (CARDIAC) 20 MG/ML IV SOLN
INTRAVENOUS | Status: DC | PRN
  Administered 2016-01-25: 100 mg via INTRAVENOUS

## 2016-01-25 MED ORDER — ONDANSETRON HCL 4 MG/2ML IJ SOLN: 4.0000 mg | Freq: Four times a day (QID) | INTRAMUSCULAR | Status: DC

## 2016-01-25 MED ORDER — FENTANYL CITRATE (PF) 100 MCG/2ML IJ SOLN
INTRAMUSCULAR | Status: AC
  Administered 2016-01-25: 100 ug
  Filled 2016-01-25: qty 2

## 2016-01-25 MED ORDER — DOCUSATE SODIUM 100 MG PO CAPS: 100.0000 mg | ORAL_CAPSULE | Freq: Two times a day (BID) | ORAL | Status: DC | PRN

## 2016-01-25 MED ORDER — CHLOROPROCAINE HCL 1 % IJ SOLN
INTRAMUSCULAR | Status: AC
  Filled 2016-01-25: qty 30

## 2016-01-25 SURGICAL SUPPLY — 15 items
CATH ROBINSON RED A/P 16FR (CATHETERS) ×2 IMPLANT
CONTAINER PREFILL 10% NBF 60ML (FORM) IMPLANT
ELECTRODE RT ANGLE VERSAPOINT (CUTTING LOOP) IMPLANT
GLOVE BIOGEL PI IND STRL 6.5 (GLOVE) ×2 IMPLANT
GLOVE BIOGEL PI IND STRL 7.0 (GLOVE) ×1 IMPLANT
GLOVE BIOGEL PI INDICATOR 6.5 (GLOVE) ×2
GLOVE BIOGEL PI INDICATOR 7.0 (GLOVE) ×1
GLOVE SURG SS PI 6.0 STRL IVOR (GLOVE) ×2 IMPLANT
GOWN STRL REUS W/TWL LRG LVL3 (GOWN DISPOSABLE) ×6 IMPLANT
PACK VAGINAL MINOR WOMEN LF (CUSTOM PROCEDURE TRAY) ×2 IMPLANT
PAD OB MATERNITY 4.3X12.25 (PERSONAL CARE ITEMS) ×2 IMPLANT
TOWEL OR 17X24 6PK STRL BLUE (TOWEL DISPOSABLE) ×4 IMPLANT
TUBING AQUILEX INFLOW (TUBING) ×2 IMPLANT
TUBING AQUILEX OUTFLOW (TUBING) ×2 IMPLANT
WATER STERILE IRR 1000ML POUR (IV SOLUTION) IMPLANT

## 2016-01-25 NOTE — Discharge Instructions (Signed)
No Ibuprofen Products (Advil, Motrin, Aleve) until 4 pm  Hysteroscopy, Care After  Refer to this sheet in the next few weeks. These instructions provide you with information on caring for yourself after your procedure. Your health care provider may also give you more specific instructions. Your treatment has been planned according to current medical practices, but problems sometimes occur. Call your health care provider if you have any problems or questions after your procedure.  WHAT TO EXPECT AFTER THE PROCEDURE After your procedure, it is typical to have the following:  You may have some cramping. This normally lasts for a couple days.  You may have bleeding. This can vary from light spotting for a few days to menstrual-like bleeding for 3-7 days. HOME CARE INSTRUCTIONS  Rest for the first 1-2 days after the procedure.  Only take over-the-counter or prescription medicines as directed by your health care provider. Do not take aspirin. It can increase the chances of bleeding.  Take showers instead of baths for 2 weeks or as directed by your health care provider.  Do not drive for 24 hours or as directed.  Do not drink alcohol while taking pain medicine.  Do not use tampons, douche, or have sexual intercourse for 2 weeks or until your health care provider says it is okay.  Take your temperature twice a day for 4-5 days. Write it down each time.  Follow your health care provider's advice about diet, exercise, and lifting.  If you develop constipation, you may:  Take a mild laxative if your health care provider approves.  Add bran foods to your diet.  Drink enough fluids to keep your urine clear or pale yellow.  Try to have someone with you or available to you for the first 24-48 hours, especially if you were given a general anesthetic.  Follow up with your health care provider as directed. SEEK MEDICAL CARE IF:  You feel dizzy or lightheaded.  You feel sick to your stomach  (nauseous).  You have abnormal vaginal discharge.  You have a rash.  You have pain that is not controlled with medicine. SEEK IMMEDIATE MEDICAL CARE IF:  You have bleeding that is heavier than a normal menstrual period.  You have a fever.  You have increasing cramps or pain, not controlled with medicine.  You have new belly (abdominal) pain.  You pass out.  You have pain in the tops of your shoulders (shoulder strap areas).  You have shortness of breath.   This information is not intended to replace advice given to you by your health care provider. Make sure you discuss any questions you have with your health care provider.   Document Released: 07/28/2013 Document Reviewed: 07/28/2013 Elsevier Interactive Patient Education [employer].

## 2016-01-25 NOTE — Transfer of Care (Signed)
Immediate Anesthesia Transfer of Care Note  Patient: Jordan [name]  Procedure(s) Performed: Procedure(s): DILATATION AND CURETTAGE /HYSTEROSCOPY (N/A)  Patient Location: PACU  Anesthesia Type:General  Level of Consciousness: awake, alert  and oriented  Airway & Oxygen Therapy: Patient Spontanous Breathing and Patient connected to nasal cannula oxygen  Post-op Assessment: Report given to RN and Post -op Vital signs reviewed and stable  Post vital signs: Reviewed and stable  Last Vitals:  Filed Vitals:   01/25/16 0831  Pulse: 100  Temp: 36.8 C  Resp: 20    Complications: No apparent anesthesia complications

## 2016-01-25 NOTE — Op Note (Signed)
PREOPERATIVE DIAGNOSIS:  Abnormal uterine bleeding. POSTOPERATIVE DIAGNOSIS: The same PROCEDURE: Hysteroscopy, Dilation and Curettage. SURGEON:  Dr. Mora Bellman   INDICATIONS: 50 y.o. G1P1001  here for scheduled surgery for abnormal uterine bleeding.   Risks of surgery were discussed with the patient including but not limited to: bleeding which may require transfusion; infection which may require antibiotics; injury to uterus or surrounding organs; intrauterine scarring which may impair future fertility; need for additional procedures including laparotomy or laparoscopy; and other postoperative/anesthesia complications. Written informed consent was obtained.    FINDINGS:  A 8 week size uterus.  Diffuse proliferative endometrium.  Normal ostia bilaterally.  ANESTHESIA:   General, paracervical block. INTRAVENOUS FLUIDS:  1500 ml of LR FLUID DEFICITS:  55 ml of normal saline ESTIMATED BLOOD LOSS:  Less than 20 ml SPECIMENS: Endometrial curettings sent to pathology COMPLICATIONS:  None immediate.  PROCEDURE DETAILS:  The patient received intravenous antibiotics while in the preoperative area.  She was then taken to the operating room where general anesthesia was administered and was found to be adequate.  After an adequate timeout was performed, she was placed in the dorsal lithotomy position and examined; then prepped and draped in the sterile manner.   Her bladder was catheterized for an unmeasured amount of clear, yellow urine. A speculum was then placed in the patient's vagina and a single tooth tenaculum was applied to the anterior lip of the cervix.   A paracervical block using 10 ml of 0.5% Marcaine was administered.  The cervix was sounded to 8 cm and dilated manually with Hagar dilators to accommodate the 5 mm diagnostic hysteroscope.  Once the cervix was dilated, the hysteroscope was inserted under direct visualization using normal saline as a suspension medium.  The uterine cavity was  carefully examined, both ostia were recognized, and diffusely proliferative endometrium was noted.   After further careful visualization of the uterine cavity, the hysteroscope was removed under direct visualization.  A sharp curettage was then performed to obtain a moderate amount of endometrial curettings.  The tenaculum was removed from the anterior lip of the cervix and the vaginal speculum was removed after noting good hemostasis.  The patient tolerated the procedure well and was taken to the recovery area awake, extubated and in stable condition.

## 2016-01-25 NOTE — Anesthesia Procedure Notes (Signed)
Procedure Name: Intubation Date/Time: 01/25/2016 9:43 AM Performed by: Riki Sheer Pre-anesthesia Checklist: Emergency Drugs available, Patient identified, Suction available, Patient being monitored and Timeout performed Patient Re-evaluated:Patient Re-evaluated prior to inductionOxygen Delivery Method: Circle system utilized Preoxygenation: Pre-oxygenation with 100% oxygen Intubation Type: IV induction Ventilation: Mask ventilation without difficulty Laryngoscope Size: Miller and 2 Grade View: Grade I Tube type: Oral Tube size: 7.0 mm Number of attempts: 1 Airway Equipment and Method: Stylet Placement Confirmation: ETT inserted through vocal cords under direct vision,  positive ETCO2,  CO2 detector and breath sounds checked- equal and bilateral Secured at: 21 cm Tube secured with: Tape Dental Injury: Teeth and Oropharynx as per pre-operative assessment

## 2016-01-25 NOTE — Anesthesia Postprocedure Evaluation (Signed)
Anesthesia Post Note  Patient: Jordan [name]  Procedure(s) Performed: Procedure(s) (LRB): DILATATION AND CURETTAGE /HYSTEROSCOPY (N/A)  Patient location during evaluation: PACU Anesthesia Type: General Level of consciousness: awake and alert Pain management: pain level controlled Vital Signs Assessment: post-procedure vital signs reviewed and stable Respiratory status: spontaneous breathing, nonlabored ventilation, respiratory function stable and patient connected to nasal cannula oxygen Cardiovascular status: blood pressure returned to baseline and stable Postop Assessment: no signs of nausea or vomiting Anesthetic complications: no    Last Vitals:  Filed Vitals:   01/25/16 1045 01/25/16 1100  BP: 125/88 121/90  Pulse: 99 99  Temp:    Resp: 18 20    Last Pain:  Filed Vitals:   01/25/16 1102  PainSc: 4                  Alexis Frock

## 2016-01-26 ENCOUNTER — Encounter (HOSPITAL_COMMUNITY): Payer: Self-pay | Admitting: Obstetrics and Gynecology

## 2016-01-27 ENCOUNTER — Inpatient Hospital Stay (HOSPITAL_COMMUNITY)
Admission: AD | Admit: 2016-01-27 | Discharge: 2016-01-27 | Disposition: A | Discharge status: Home / Self Care | Payer: Medicaid Other | Source: Ambulatory Visit | Attending: Obstetrics & Gynecology | Admitting: Obstetrics & Gynecology

## 2016-01-27 ENCOUNTER — Encounter (HOSPITAL_COMMUNITY): Payer: Self-pay | Admitting: *Deleted

## 2016-01-27 DIAGNOSIS — Z79899 Other long term (current) drug therapy: Secondary | ICD-10-CM | POA: Insufficient documentation

## 2016-01-27 DIAGNOSIS — R102 Pelvic and perineal pain: Secondary | ICD-10-CM

## 2016-01-27 DIAGNOSIS — F319 Bipolar disorder, unspecified: Secondary | ICD-10-CM | POA: Diagnosis not present

## 2016-01-27 LAB — URINALYSIS, ROUTINE W REFLEX MICROSCOPIC
Bilirubin Urine: NEGATIVE
Glucose, UA: NEGATIVE mg/dL
Ketones, ur: NEGATIVE mg/dL
Nitrite: POSITIVE — AB
Protein, ur: NEGATIVE mg/dL
Specific Gravity, Urine: 1.025 (ref 1.005–1.030)
pH: 5.5 (ref 5.0–8.0)

## 2016-01-27 LAB — URINE MICROSCOPIC-ADD ON

## 2016-01-27 MED ORDER — NYSTATIN-TRIAMCINOLONE 100000-0.1 UNIT/GM-% EX CREA
TOPICAL_CREAM | Freq: Two times a day (BID) | CUTANEOUS | Status: DC
  Administered 2016-01-27: 20:00:00 via TOPICAL
  Filled 2016-01-27: qty 15

## 2016-01-27 NOTE — MAU Note (Signed)
Pt states she had a D&C on Thursday and pt states she had not pain until today.  Pt states the pain is in her vagina.  Pt states she did not take any of her pain pills today because she called the on-call nurse and they told her to be evaluated.  Pt states she is no longer bleeding.

## 2016-01-27 NOTE — MAU Provider Note (Signed)
History     CSN: [id number]  Arrival date and time: 01/27/16 1807   First Provider Initiated Contact with Patient 01/27/16 1858      Chief Complaint  Patient presents with  . Pelvic Pain   HPIpt is not pregnant and is 2 days s/p D&C.  Pt had D&C for AUB- pathology pending. Pt states her bleeding has subsided, but she has pain at the opening of her vagina. Pt is not having cramping. Pt denies pain with urination, constipation, diarrhea or fever/chills. Pt has not taken pain med today  RN note:  Expand All Collapse All   Pt states she had a D&C on Thursday and pt states she had not pain until today. Pt states the pain is in her vagina. Pt states she did not take any of her pain pills today because she called the on-call nurse and they told her to be evaluated. Pt states she is no longer bleeding       Past Medical History  Diagnosis Date  . Mental disorder   . Bipolar 1 disorder ([place])   . Bipolar 1 disorder ([place])   . Yeast infection of the vagina   . Bipolar disorder ([place])   . Obesity   . UTI (lower urinary tract infection)   . Alcohol abuse   . Shortness of breath dyspnea   . Anemia   . 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 Cholecystectomy    . Hysteroscopy w/d&c N/A 01/25/2016    Procedure: DILATATION AND CURETTAGE /HYSTEROSCOPY;  Surgeon: Mora Bellman, MD;  Location: [facility];  Service: Gynecology;  Laterality: N/A;    History reviewed. No pertinent family history.  Social History  Substance Use Topics  . Smoking status: Never Smoker   . Smokeless tobacco: Never Used  . Alcohol Use: No    Allergies:  Allergies  Allergen Reactions  . Citalopram Other (See Comments)    Possible cause of pancytopenia.  . Lamotrigine Other (See Comments)    Possible cause of pancytopenia.  . Sulfa Antibiotics Anaphylaxis  . Tramadol Anaphylaxis, Shortness Of Breath and Swelling    Prescriptions  prior to admission  Medication Sig Dispense Refill Last Dose  . docusate sodium (COLACE) 100 MG capsule Take 1 capsule (100 mg total) by mouth 2 (two) times daily as needed. 30 capsule 2   . ibuprofen (ADVIL,MOTRIN) 600 MG tablet Take 1 tablet (600 mg total) by mouth every 6 (six) hours as needed. 30 tablet 1   . oxyCODONE-acetaminophen (PERCOCET) 5-325 MG tablet Take 1-2 tablets by mouth every 4 (four) hours as needed for severe pain. 30 tablet 0     Review of Systems  Gastrointestinal: Negative for nausea, vomiting, abdominal pain, diarrhea and constipation.  Genitourinary: Negative for dysuria, urgency and frequency.   Physical Exam   Blood pressure 123/76, pulse 105, temperature 97.7 F (36.5 C), temperature source Oral, resp. rate 18, last menstrual period 12/05/2015.  Physical Exam  Nursing note and vitals reviewed. Constitutional: She is oriented to person, place, and time. She appears well-developed and well-nourished. No distress.  HENT:  Head: Normocephalic.  Eyes: Pupils are equal, round, and reactive to light.  Neck: Normal range of motion.  Cardiovascular: Normal rate.   Respiratory: Effort normal.  GI: Soft. She exhibits no distension. There is no tenderness. There is no rebound and no guarding.  protrubent  Genitourinary:  External vagina without erythema, lesions or  abnormality noted; small amount of redness at introitus which is mildly tender to touch and pt states that is the discomfort she is feeling. Small amount of brown discharge in vault  Musculoskeletal: Normal range of motion.  Neurological: She is alert and oriented to person, place, and time.  Skin: Skin is warm and dry.  Psychiatric: She has a normal mood and affect.    MAU Course  Procedures Results for orders placed or performed during the hospital encounter of 01/27/16 (from the past 24 hour(s))  Urinalysis, Routine w reflex microscopic (not at [facility])     Status: Abnormal   Collection Time:  01/27/16  6:15 PM  Result Value Ref Range   Color, Urine YELLOW YELLOW   APPearance HAZY (A) CLEAR   Specific Gravity, Urine 1.025 1.005 - 1.030   pH 5.5 5.0 - 8.0   Glucose, UA NEGATIVE NEGATIVE mg/dL   Hgb urine dipstick LARGE (A) NEGATIVE   Bilirubin Urine NEGATIVE NEGATIVE   Ketones, ur NEGATIVE NEGATIVE mg/dL   Protein, ur NEGATIVE NEGATIVE mg/dL   Nitrite POSITIVE (A) NEGATIVE   Leukocytes, UA MODERATE (A) NEGATIVE  Urine microscopic-add on     Status: Abnormal   Collection Time: 01/27/16  6:15 PM  Result Value Ref Range   Squamous Epithelial / LPF 0-5 (A) NONE SEEN   WBC, UA TOO NUMEROUS TO COUNT 0 - 5 WBC/hpf   RBC / HPF 6-30 0 - 5 RBC/hpf   Bacteria, UA MANY (A) NONE SEEN  review of previous cultures reviewed resistant to most meds- proteous and E. Coli identified sensitive to Macrobid mycolog cream ordered in MAU to be put on irritated areas at introitus with relief of discomfort   Assessment and Plan  Vaginal pain- Mycology prescribed in MAU- apply BID as needed for discomfort ?UTI- culture pending- pt asymptomatic and pt prefers to wait until culture results for any treatment F/u with Dr. Elly Modena as scheduled- return sooner for increase in pain or bleeding  Jordan [name] 01/27/2016, 7:02 PM

## 2016-01-30 LAB — CULTURE, OB URINE: Culture: >=100000 — AB

## 2016-01-31 ENCOUNTER — Telehealth: Payer: Self-pay | Admitting: General Practice

## 2016-01-31 ENCOUNTER — Other Ambulatory Visit: Payer: Self-pay | Admitting: Certified Nurse Midwife

## 2016-01-31 DIAGNOSIS — N390 Urinary tract infection, site not specified: Secondary | ICD-10-CM

## 2016-01-31 MED ORDER — NITROFURANTOIN MONOHYD MACRO 100 MG PO CAPS: 100.0000 mg | ORAL_CAPSULE | Freq: Two times a day (BID) | ORAL | Status: DC

## 2016-01-31 NOTE — Telephone Encounter (Signed)
Per Dr Harolyn Rutherford, patient's pathology from surgery was negative. Called patient, no answer- left message stating we are trying to reach you with non urgent results, please call us back at the clinics

## 2016-02-01 ENCOUNTER — Emergency Department (HOSPITAL_COMMUNITY): Payer: Medicaid Other

## 2016-02-01 ENCOUNTER — Encounter (HOSPITAL_COMMUNITY): Payer: Self-pay | Admitting: Emergency Medicine

## 2016-02-01 ENCOUNTER — Emergency Department (HOSPITAL_COMMUNITY)
Admission: EM | Admit: 2016-02-01 | Discharge: 2016-02-01 | Disposition: A | Discharge status: Home / Self Care | Payer: Medicaid Other | Attending: Emergency Medicine | Admitting: Emergency Medicine

## 2016-02-01 DIAGNOSIS — E669 Obesity, unspecified: Secondary | ICD-10-CM | POA: Diagnosis not present

## 2016-02-01 DIAGNOSIS — Z792 Long term (current) use of antibiotics: Secondary | ICD-10-CM | POA: Diagnosis not present

## 2016-02-01 DIAGNOSIS — Z862 Personal history of diseases of the blood and blood-forming organs and certain disorders involving the immune mechanism: Secondary | ICD-10-CM | POA: Diagnosis not present

## 2016-02-01 DIAGNOSIS — N390 Urinary tract infection, site not specified: Secondary | ICD-10-CM | POA: Diagnosis not present

## 2016-02-01 DIAGNOSIS — Z8659 Personal history of other mental and behavioral disorders: Secondary | ICD-10-CM | POA: Insufficient documentation

## 2016-02-01 DIAGNOSIS — Z3202 Encounter for pregnancy test, result negative: Secondary | ICD-10-CM | POA: Diagnosis not present

## 2016-02-01 DIAGNOSIS — R109 Unspecified abdominal pain: Secondary | ICD-10-CM | POA: Diagnosis present

## 2016-02-01 LAB — COMPREHENSIVE METABOLIC PANEL
ALT: 20 U/L (ref 14–54)
AST: 19 U/L (ref 15–41)
Albumin: 3.9 g/dL (ref 3.5–5.0)
Alkaline Phosphatase: 102 U/L (ref 38–126)
Anion gap: 10 (ref 5–15)
BUN: 12 mg/dL (ref 6–20)
CO2: 25 mmol/L (ref 22–32)
Calcium: 9.1 mg/dL (ref 8.9–10.3)
Chloride: 106 mmol/L (ref 101–111)
Creatinine, Ser: 0.72 mg/dL (ref 0.44–1.00)
GFR calc Af Amer: >60 mL/min (ref >60)
GFR calc non Af Amer: >60 mL/min (ref >60)
Glucose, Bld: 109 mg/dL — ABNORMAL HIGH (ref 65–99)
Potassium: 3.8 mmol/L (ref 3.5–5.1)
Sodium: 141 mmol/L (ref 135–145)
Total Bilirubin: 1 mg/dL (ref 0.3–1.2)
Total Protein: 7.1 g/dL (ref 6.5–8.1)

## 2016-02-01 LAB — CBC
HCT: 44.6 % (ref 36.0–46.0)
Hemoglobin: 15.3 g/dL — ABNORMAL HIGH (ref 12.0–15.0)
MCH: 29.9 pg (ref 26.0–34.0)
MCHC: 34.3 g/dL (ref 30.0–36.0)
MCV: 87.3 fL (ref 78.0–100.0)
Platelets: 227 10*3/uL (ref 150–400)
RBC: 5.11 MIL/uL (ref 3.87–5.11)
RDW: 12.9 % (ref 11.5–15.5)
WBC: 9.3 10*3/uL (ref 4.0–10.5)

## 2016-02-01 LAB — URINALYSIS, ROUTINE W REFLEX MICROSCOPIC
Bilirubin Urine: NEGATIVE
Glucose, UA: NEGATIVE mg/dL
Ketones, ur: NEGATIVE mg/dL
Nitrite: POSITIVE — AB
Protein, ur: NEGATIVE mg/dL
Specific Gravity, Urine: 1.018 (ref 1.005–1.030)
pH: 6 (ref 5.0–8.0)

## 2016-02-01 LAB — I-STAT BETA HCG BLOOD, ED (MC, WL, AP ONLY): I-stat hCG, quantitative: <5 m[IU]/mL (ref <5)

## 2016-02-01 LAB — URINE MICROSCOPIC-ADD ON

## 2016-02-01 LAB — LIPASE, BLOOD: Lipase: 32 U/L (ref 11–51)

## 2016-02-01 MED ORDER — CEPHALEXIN 500 MG PO CAPS: 500.0000 mg | ORAL_CAPSULE | Freq: Four times a day (QID) | ORAL | Status: DC

## 2016-02-01 NOTE — ED Notes (Signed)
Pt is asking when her tests are getting resulted, or when is the doctor going to see her again.  Reason for delay explained to pt.  She states that she is hungry and is thirsty.  Made her aware that this nurse will ask the EDP if she can have something by mouth.

## 2016-02-01 NOTE — ED Notes (Signed)
Jordan [name] Resides EDP okayed for pt to have something to drink.

## 2016-02-01 NOTE — ED Notes (Signed)
Pt ambulated to the BR with steady gait.   

## 2016-02-01 NOTE — Discharge Instructions (Signed)

## 2016-02-01 NOTE — ED Notes (Signed)
Pt reports RUQ pain x 2 days.  Reports having a D&C Thursday.  Denies n/v at this time.

## 2016-02-01 NOTE — ED Provider Notes (Signed)
CSN: [id number]     Arrival date & time 02/01/16  1356 History   First MD Initiated Contact with Patient 02/01/16 1616     Chief Complaint  Patient presents with  . Abdominal Pain     (Consider location/radiation/quality/duration/timing/severity/associated sxs/prior Treatment) Patient is a 50 y.o. female presenting with abdominal pain. The history is provided by the patient.  Abdominal Pain Pain location:  R flank Pain quality: sharp   Pain radiates to:  Does not radiate Pain severity:  Mild Onset quality:  Sudden Duration:  1 day Timing:  Intermittent Progression:  Waxing and waning Relieved by:  Nothing Worsened by:  Nothing tried Ineffective treatments:  None tried Associated symptoms: no diarrhea, no fever, no hematuria, no melena and no nausea   Risk factors: obesity     Past Medical History  Diagnosis Date  . Mental disorder   . Bipolar 1 disorder ([place])   . Bipolar 1 disorder ([place])   . Yeast infection of the vagina   . Bipolar disorder ([place])   . Obesity   . UTI (lower urinary tract infection)   . Alcohol abuse   . Shortness of breath dyspnea   . Anemia   . 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 Cholecystectomy    . Hysteroscopy w/d&c N/A 01/25/2016    Procedure: DILATATION AND CURETTAGE /HYSTEROSCOPY;  Surgeon: Mora Bellman, MD;  Location: [facility];  Service: Gynecology;  Laterality: N/A;   History reviewed. No pertinent family history. Social History  Substance Use Topics  . Smoking status: Never Smoker   . Smokeless tobacco: Never Used  . Alcohol Use: No   OB History    Gravida Para Term Preterm AB TAB SAB Ectopic Multiple Living   1 1 1       1      Review of Systems  Constitutional: Negative for fever.  Gastrointestinal: Positive for abdominal pain. Negative for nausea, diarrhea and melena.  Genitourinary: Negative for hematuria.  All other systems reviewed and are  negative.     Allergies  Citalopram; Lamotrigine; Sulfa antibiotics; and Tramadol  Home Medications   Prior to Admission medications   Medication Sig Start Date End Date Taking? Authorizing Provider  docusate sodium (COLACE) 100 MG capsule Take 1 capsule (100 mg total) by mouth 2 (two) times daily as needed. Patient not taking: Reported on 01/27/2016 01/25/16   Mora Bellman, MD  ibuprofen (ADVIL,MOTRIN) 600 MG tablet Take 1 tablet (600 mg total) by mouth every 6 (six) hours as needed. Patient not taking: Reported on 01/27/2016 01/25/16   Mora Bellman, MD  nitrofurantoin, macrocrystal-monohydrate, (MACROBID) 100 MG capsule Take 1 capsule (100 mg total) by mouth 2 (two) times daily. 01/31/16   Lori A Clemmons, CNM  oxyCODONE-acetaminophen (PERCOCET) 5-325 MG tablet Take 1-2 tablets by mouth every 4 (four) hours as needed for severe pain. Patient not taking: Reported on 01/27/2016 01/25/16   Peggy Constant, MD   BP 135/89 mmHg  Pulse 102  Temp(Src) 98.1 F (36.7 C) (Oral)  Resp 18  SpO2 100%  LMP 12/05/2015 (LMP Unknown) Physical Exam  Constitutional: She is oriented to person, place, and time. She appears well-developed and well-nourished.  Non-toxic appearance. No distress.  HENT:  Head: Normocephalic and atraumatic.  Eyes: Conjunctivae, EOM and lids are normal. Pupils are equal, round, and reactive to light.  Neck: Normal range of motion. Neck supple. No tracheal deviation present. No thyroid mass  present.  Cardiovascular: Normal rate, regular rhythm and normal heart sounds.  Exam reveals no gallop.   No murmur heard. Pulmonary/Chest: Effort normal and breath sounds normal. No stridor. No respiratory distress. She has no decreased breath sounds. She has no wheezes. She has no rhonchi. She has no rales.  Abdominal: Soft. Normal appearance and bowel sounds are normal. She exhibits no distension. There is no tenderness. There is no rebound and no CVA tenderness.  Musculoskeletal: Normal  range of motion. She exhibits no edema or tenderness.  Neurological: She is alert and oriented to person, place, and time. She has normal strength. No cranial nerve deficit or sensory deficit. GCS eye subscore is 4. GCS verbal subscore is 5. GCS motor subscore is 6.  Skin: Skin is warm and dry. No abrasion and no rash noted.  Psychiatric: She has a normal mood and affect. Her speech is normal and behavior is normal.  Nursing note and vitals reviewed.   ED Course  Procedures (including critical care time) Labs Review Labs Reviewed  COMPREHENSIVE METABOLIC PANEL - Abnormal; Notable for the following:    Glucose, Bld 109 (*)    All other components within normal limits  CBC - Abnormal; Notable for the following:    Hemoglobin 15.3 (*)    All other components within normal limits  URINALYSIS, ROUTINE W REFLEX MICROSCOPIC (NOT AT [facility]) - Abnormal; Notable for the following:    APPearance CLOUDY (*)    Hgb urine dipstick TRACE (*)    Nitrite POSITIVE (*)    Leukocytes, UA SMALL (*)    All other components within normal limits  URINE MICROSCOPIC-ADD ON - Abnormal; Notable for the following:    Squamous Epithelial / LPF 6-30 (*)    Bacteria, UA MANY (*)    All other components within normal limits  LIPASE, BLOOD  I-STAT BETA HCG BLOOD, ED ([facility], [facility], [facility] ONLY)    Imaging Review No results found. I have personally reviewed and evaluated these images and lab results as part of my medical decision-making.   EKG Interpretation None      MDM   Final diagnoses:  None    Patient with likely UTI will be treated for that.    Lacretia Leigh, MD 02/01/16 Bosie Helper

## 2016-02-01 NOTE — ED Notes (Addendum)
Per EMS, from home c/o lower right sided abdominal pain x 2 days. Did have a D&C last Thursday, but she's worried about her appendix. Denies N/V/D, fever chills

## 2016-02-01 NOTE — ED Notes (Signed)
Patient transported to CT 

## 2016-02-05 ENCOUNTER — Telehealth: Payer: Self-pay | Admitting: Obstetrics and Gynecology

## 2016-02-05 NOTE — Telephone Encounter (Signed)
Calling for test results ?

## 2016-02-05 NOTE — Telephone Encounter (Signed)
Called pt and LM that this is our second attempt but wanted to inform you that your results are normal.  If she has any questions to please give the office a call.

## 2016-02-09 ENCOUNTER — Emergency Department (HOSPITAL_COMMUNITY)
Admission: EM | Admit: 2016-02-09 | Discharge: 2016-02-10 | Disposition: A | Discharge status: Home / Self Care | Payer: Medicaid Other | Attending: Emergency Medicine | Admitting: Emergency Medicine

## 2016-02-09 ENCOUNTER — Emergency Department (HOSPITAL_COMMUNITY): Payer: Medicaid Other

## 2016-02-09 ENCOUNTER — Encounter (HOSPITAL_COMMUNITY): Payer: Self-pay | Admitting: Emergency Medicine

## 2016-02-09 DIAGNOSIS — E669 Obesity, unspecified: Secondary | ICD-10-CM | POA: Diagnosis not present

## 2016-02-09 DIAGNOSIS — E860 Dehydration: Secondary | ICD-10-CM | POA: Diagnosis not present

## 2016-02-09 DIAGNOSIS — R072 Precordial pain: Secondary | ICD-10-CM | POA: Diagnosis not present

## 2016-02-09 DIAGNOSIS — Z79899 Other long term (current) drug therapy: Secondary | ICD-10-CM | POA: Diagnosis not present

## 2016-02-09 DIAGNOSIS — Z87440 Personal history of urinary (tract) infections: Secondary | ICD-10-CM | POA: Diagnosis not present

## 2016-02-09 DIAGNOSIS — Z8619 Personal history of other infectious and parasitic diseases: Secondary | ICD-10-CM | POA: Diagnosis not present

## 2016-02-09 DIAGNOSIS — Z862 Personal history of diseases of the blood and blood-forming organs and certain disorders involving the immune mechanism: Secondary | ICD-10-CM | POA: Insufficient documentation

## 2016-02-09 DIAGNOSIS — R079 Chest pain, unspecified: Secondary | ICD-10-CM | POA: Diagnosis present

## 2016-02-09 DIAGNOSIS — R0789 Other chest pain: Secondary | ICD-10-CM | POA: Diagnosis not present

## 2016-02-09 DIAGNOSIS — Z8659 Personal history of other mental and behavioral disorders: Secondary | ICD-10-CM | POA: Diagnosis not present

## 2016-02-09 LAB — CBC WITH DIFFERENTIAL/PLATELET
Basophils Absolute: 0 10*3/uL (ref 0.0–0.1)
Basophils Relative: 0 %
Eosinophils Absolute: 0 10*3/uL (ref 0.0–0.7)
Eosinophils Relative: 0 %
HCT: 46.1 % — ABNORMAL HIGH (ref 36.0–46.0)
Hemoglobin: 15.5 g/dL — ABNORMAL HIGH (ref 12.0–15.0)
Lymphocytes Relative: 7 %
Lymphs Abs: 0.7 10*3/uL (ref 0.7–4.0)
MCH: 29.4 pg (ref 26.0–34.0)
MCHC: 33.6 g/dL (ref 30.0–36.0)
MCV: 87.5 fL (ref 78.0–100.0)
Monocytes Absolute: 0.7 10*3/uL (ref 0.1–1.0)
Monocytes Relative: 7 %
Neutro Abs: 8.8 10*3/uL — ABNORMAL HIGH (ref 1.7–7.7)
Neutrophils Relative %: 86 %
Platelets: 187 10*3/uL (ref 150–400)
RBC: 5.27 MIL/uL — ABNORMAL HIGH (ref 3.87–5.11)
RDW: 12.8 % (ref 11.5–15.5)
WBC: 10.1 10*3/uL (ref 4.0–10.5)

## 2016-02-09 LAB — COMPREHENSIVE METABOLIC PANEL
ALT: 14 U/L (ref 14–54)
AST: 20 U/L (ref 15–41)
Albumin: 3.4 g/dL — ABNORMAL LOW (ref 3.5–5.0)
Alkaline Phosphatase: 110 U/L (ref 38–126)
Anion gap: 12 (ref 5–15)
BUN: 10 mg/dL (ref 6–20)
CO2: 21 mmol/L — ABNORMAL LOW (ref 22–32)
Calcium: 8.8 mg/dL — ABNORMAL LOW (ref 8.9–10.3)
Chloride: 104 mmol/L (ref 101–111)
Creatinine, Ser: 0.82 mg/dL (ref 0.44–1.00)
GFR calc Af Amer: >60 mL/min (ref >60)
GFR calc non Af Amer: >60 mL/min (ref >60)
Glucose, Bld: 111 mg/dL — ABNORMAL HIGH (ref 65–99)
Potassium: 4.7 mmol/L (ref 3.5–5.1)
Sodium: 137 mmol/L (ref 135–145)
Total Bilirubin: 1.9 mg/dL — ABNORMAL HIGH (ref 0.3–1.2)
Total Protein: 6.2 g/dL — ABNORMAL LOW (ref 6.5–8.1)

## 2016-02-09 LAB — I-STAT CG4 LACTIC ACID, ED: Lactic Acid, Venous: 1.47 mmol/L (ref 0.5–2.0)

## 2016-02-09 LAB — I-STAT TROPONIN, ED: Troponin i, poc: 0 ng/mL (ref 0.00–0.08)

## 2016-02-09 LAB — LIPASE, BLOOD: Lipase: 30 U/L (ref 11–51)

## 2016-02-09 LAB — BRAIN NATRIURETIC PEPTIDE: B Natriuretic Peptide: 24.5 pg/mL (ref 0.0–100.0)

## 2016-02-09 MED ORDER — SODIUM CHLORIDE 0.9 % IV BOLUS (SEPSIS)
1000.0000 mL | Freq: Once | INTRAVENOUS | Status: AC
  Administered 2016-02-09: 1000 mL via INTRAVENOUS

## 2016-02-09 MED ORDER — ONDANSETRON HCL 4 MG/2ML IJ SOLN
4.0000 mg | Freq: Once | INTRAMUSCULAR | Status: AC
  Administered 2016-02-09: 4 mg via INTRAVENOUS
  Filled 2016-02-09: qty 2

## 2016-02-09 MED ORDER — FENTANYL CITRATE (PF) 100 MCG/2ML IJ SOLN
50.0000 ug | Freq: Once | INTRAMUSCULAR | Status: AC
  Administered 2016-02-09: 50 ug via INTRAVENOUS
  Filled 2016-02-09: qty 2

## 2016-02-09 NOTE — ED Provider Notes (Signed)
CSN: [id number]     Arrival date & time 02/09/16  2139 History   First MD Initiated Contact with Patient 02/09/16 2146     Chief Complaint  Patient presents with  . Chest Pain     (Consider location/radiation/quality/duration/timing/severity/associated sxs/prior Treatment) Patient is a 50 y.o. female presenting with chest pain.  Chest Pain Pain location:  L chest and substernal area Pain quality: aching, pressure and sharp   Pain radiates to:  Does not radiate Pain radiates to the back: no   Pain severity:  Moderate Onset quality:  Sudden Duration:  1 day Timing:  Constant Progression:  Worsening Chronicity:  New Context: at rest   Relieved by:  Nothing Worsened by:  Deep breathing and movement Ineffective treatments:  None tried Associated symptoms: cough and shortness of breath   Associated symptoms: no abdominal pain, no fatigue, no fever, no headache, no nausea, no syncope and no weakness     Past Medical History  Diagnosis Date  . Mental disorder   . Bipolar 1 disorder ([place])   . Bipolar 1 disorder ([place])   . Yeast infection of the vagina   . Bipolar disorder ([place])   . Obesity   . UTI (lower urinary tract infection)   . Alcohol abuse   . Shortness of breath dyspnea   . Anemia   . 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 Cholecystectomy    . Hysteroscopy w/d&c N/A 01/25/2016    Procedure: DILATATION AND CURETTAGE /HYSTEROSCOPY;  Surgeon: Mora Bellman, MD;  Location: [facility];  Service: Gynecology;  Laterality: N/A;   No family history on file. Social History  Substance Use Topics  . Smoking status: Never Smoker   . Smokeless tobacco: Never Used  . Alcohol Use: No   OB History    Gravida Para Term Preterm AB TAB SAB Ectopic Multiple Living   1 1 1       1      Review of Systems  Constitutional: Negative for fever and fatigue.  HENT: Negative for congestion and rhinorrhea.   Eyes:  Negative for visual disturbance.  Respiratory: Positive for cough and shortness of breath.   Cardiovascular: Positive for chest pain. Negative for leg swelling and syncope.  Gastrointestinal: Negative for nausea and abdominal pain.  Genitourinary: Negative for dysuria and flank pain.  Musculoskeletal: Negative for neck pain and neck stiffness.  Skin: Negative for rash.  Allergic/Immunologic: Negative for immunocompromised state.  Neurological: Negative for syncope, weakness and headaches.      Allergies  Citalopram; Lamotrigine; Sulfa antibiotics; and Tramadol  Home Medications   Prior to Admission medications   Medication Sig Start Date End Date Taking? Authorizing Provider  nitrofurantoin, macrocrystal-monohydrate, (MACROBID) 100 MG capsule Take 1 capsule (100 mg total) by mouth 2 (two) times daily. 01/31/16  Yes Lori A Clemmons, CNM  cephALEXin (KEFLEX) 500 MG capsule Take 1 capsule (500 mg total) by mouth 4 (four) times daily. Patient not taking: Reported on 02/09/2016 02/01/16   Lacretia Leigh, MD  docusate sodium (COLACE) 100 MG capsule Take 1 capsule (100 mg total) by mouth 2 (two) times daily as needed. Patient not taking: Reported on 01/27/2016 01/25/16   Mora Bellman, MD  ibuprofen (ADVIL,MOTRIN) 600 MG tablet Take 1 tablet (600 mg total) by mouth every 6 (six) hours as needed. Patient not taking: Reported on 01/27/2016 01/25/16   Mora Bellman, MD  oxyCODONE-acetaminophen (PERCOCET) 5-325 MG tablet Take  1-2 tablets by mouth every 4 (four) hours as needed for severe pain. Patient not taking: Reported on 01/27/2016 01/25/16   Peggy Constant, MD   BP 128/99 mmHg  Pulse 99  Temp(Src) 99.6 F (37.6 C) (Oral)  Resp 17  SpO2 100% Physical Exam  Constitutional: She is oriented to person, place, and time. She appears well-developed and well-nourished. No distress.  HENT:  Head: Normocephalic.  Mouth/Throat: No oropharyngeal exudate.  Eyes: Pupils are equal, round, and reactive to light.   Neck: Normal range of motion. Neck supple.  Cardiovascular: Regular rhythm, normal heart sounds and intact distal pulses.  Tachycardia present.  Exam reveals no friction rub.   No murmur heard. Pulmonary/Chest: Effort normal and breath sounds normal. No respiratory distress. She has no wheezes. She has no rales.  Abdominal: Soft. She exhibits no distension. There is no tenderness.  Musculoskeletal: She exhibits no edema.  Neurological: She is alert and oriented to person, place, and time.  Skin: Skin is warm. No rash noted.  Nursing note and vitals reviewed.   ED Course  Procedures (including critical care time) Labs Review Labs Reviewed  CBC WITH DIFFERENTIAL/PLATELET - Abnormal; Notable for the following:    RBC 5.27 (*)    Hemoglobin 15.5 (*)    HCT 46.1 (*)    Neutro Abs 8.8 (*)    All other components within normal limits  COMPREHENSIVE METABOLIC PANEL - Abnormal; Notable for the following:    CO2 21 (*)    Glucose, Bld 111 (*)    Calcium 8.8 (*)    Total Protein 6.2 (*)    Albumin 3.4 (*)    Total Bilirubin 1.9 (*)    All other components within normal limits  LIPASE, BLOOD  BRAIN NATRIURETIC PEPTIDE  I-STAT CG4 LACTIC ACID, ED  I-STAT TROPOININ, ED  I-STAT CG4 LACTIC ACID, ED  Randolm Idol, ED    Imaging Review Dg Chest 2 View  02/09/2016  CLINICAL DATA:  Left-sided chest pain beginning today. EXAM: CHEST  2 VIEW COMPARISON:  08/14/2015 FINDINGS: Shallow inspiration with elevation of the right hemidiaphragm. Normal heart size and pulmonary vascularity. No focal airspace disease or consolidation in the lungs. No blunting of costophrenic angles. No pneumothorax. Mediastinal contours appear intact. IMPRESSION: No active cardiopulmonary disease. Electronically Signed   By: Lucienne Capers M.D.   On: 02/09/2016 23:21   I have personally reviewed and evaluated these images and lab results as part of my medical decision-making.   EKG Interpretation   Date/Time:   Friday February 09 2016 21:44:40 EDT Ventricular Rate:  127 PR Interval:  153 QRS Duration: 76 QT Interval:  296 QTC Calculation: 430 R Axis:   58 Text Interpretation:  Sinus tachycardia Low voltage, precordial leads  Abnormal R-wave progression, early transition Sinus tachycardia Abnormal  ekg Confirmed by Carmin Muskrat  MD [phone]) on 02/09/2016 9:48:40 PM      MDM   50 year old female with past medical history of bipolar disorder, cognitive disorder, alcohol abuse, who presents with acute onset of epigastric and left-sided chest pain with mild pleuritic component. On arrival, patient is notably anxious and tachycardic but otherwise hemodynamically stable. Examination is as above. At this time, suspect likely a strong anxiety component versus musculoskeletal chest wall pain. The patient has been sleeping in different positions per her report. The pain is also reproducible on exam. However, the patient has recently been on hormone therapy for vaginal bleeding and must consider PE in the setting of persistent tachycardia. Will  obtain CTA PE for further assessment. Regarding her chest pain as well, she has a low risk heart score and EKG is nonischemic. Will obtain delta troponin. Otherwise, no abdominal tenderness and CMP and lipase showed normal LFTs as well as lipase. She has a mildly elevated bilirubin but no right upper quadrant tenderness to palpation to suggest acute cholecystitis as well as normal white blood cell count. She denies any nausea or vomiting. Will follow delta troponin and CT PE with disposition accordingly. Dr. Claudine Mouton to follow-up results at time of sign out.  Clinical Impression: 1. Precordial pain   2. Chest wall pain   3. Dehydration     Disposition: Pending  Condition: Good  Pt seen in conjunction with Dr. Dillard Essex, MD 02/10/16 1201  Carmin Muskrat, MD 02/11/16 2052

## 2016-02-09 NOTE — ED Notes (Signed)
Patient comes from Home states she started having  substernal  chest Pressure at 2100. Denise any radiation. Patient denies any SOB, N/V . Patient Alert and oriented x4. Patient given 324 ASA by EMS> Patient was Dx with UTI by MD and has been on antibiotic.

## 2016-02-10 ENCOUNTER — Encounter (HOSPITAL_COMMUNITY): Payer: Self-pay

## 2016-02-10 ENCOUNTER — Emergency Department (HOSPITAL_COMMUNITY): Payer: Medicaid Other

## 2016-02-10 LAB — I-STAT CG4 LACTIC ACID, ED: Lactic Acid, Venous: 0.79 mmol/L (ref 0.5–2.0)

## 2016-02-10 LAB — I-STAT TROPONIN, ED: Troponin i, poc: 0 ng/mL (ref 0.00–0.08)

## 2016-02-10 MED ORDER — NAPROXEN 375 MG PO TABS
375.0000 mg | ORAL_TABLET | Freq: Two times a day (BID) | ORAL | Status: DC | PRN
Start: 2016-02-10 — End: 2016-03-05

## 2016-02-10 MED ORDER — SODIUM CHLORIDE 0.9 % IV BOLUS (SEPSIS): 1000.0000 mL | Freq: Once | INTRAVENOUS | Status: DC

## 2016-02-10 MED ORDER — IOPAMIDOL (ISOVUE-370) INJECTION 76%
INTRAVENOUS | Status: AC
  Administered 2016-02-10: 100 mL
  Filled 2016-02-10: qty 100

## 2016-02-10 MED ORDER — NAPROXEN 375 MG PO TABS: 375.0000 mg | ORAL_TABLET | Freq: Two times a day (BID) | ORAL | Status: DC

## 2016-02-10 NOTE — ED Notes (Signed)
Patient left without discharge instructions.

## 2016-02-10 NOTE — ED Notes (Signed)
Pt requesting something to drink due to the IV making her lips dry. OK per RN Rod Holler pt given water to drink

## 2016-02-10 NOTE — ED Notes (Signed)
This RN attempted an IV 20 gage for CT angio and was unsuccessful

## 2016-02-10 NOTE — Discharge Instructions (Signed)
-   Drink at least 6-8 glasses of water per day for the next several days - Follow up with your primary care provider in the next days to discuss your chest pain and further workup.  There is a 6 mm noncalcified nodule in the right lung base. Non-contrast chest CT at 6-12 months is recommended. If the nodule is stable at time of repeat CT, then future CT at 18-24 months (from today's scan) is considered optional for low-risk patients, but is recommended for high-risk patients  Chest Wall Pain Chest wall pain is pain in or around the bones and muscles of your chest. Sometimes, an injury causes this pain. Sometimes, the cause may not be known. This pain may take several weeks or longer to get better. HOME CARE INSTRUCTIONS  Pay attention to any changes in your symptoms. Take these actions to help with your pain:   Rest as told by your health care provider.   Avoid activities that cause pain. These include any activities that use your chest muscles or your abdominal and side muscles to lift heavy items.   If directed, apply ice to the painful area:  Put ice in a plastic bag.  Place a towel between your skin and the bag.  Leave the ice on for 20 minutes, 2-3 times per day.  Take over-the-counter and prescription medicines only as told by your health care provider.  Do not use tobacco products, including cigarettes, chewing tobacco, and e-cigarettes. If you need help quitting, ask your health care provider.  Keep all follow-up visits as told by your health care provider. This is important. SEEK MEDICAL CARE IF:  You have a fever.  Your chest pain becomes worse.  You have new symptoms. SEEK IMMEDIATE MEDICAL CARE IF:  You have nausea or vomiting.  You feel sweaty or light-headed.  You have a cough with phlegm (sputum) or you cough up blood.  You develop shortness of breath.   This information is not intended to replace advice given to you by your health care provider. Make  sure you discuss any questions you have with your health care provider.   Document Released: 10/07/2005 Document Revised: 06/28/2015 Document Reviewed: 01/02/2015 Elsevier Interactive Patient Education [employer].

## 2016-02-15 ENCOUNTER — Encounter: Payer: Self-pay | Admitting: Obstetrics and Gynecology

## 2016-02-15 ENCOUNTER — Ambulatory Visit (INDEPENDENT_AMBULATORY_CARE_PROVIDER_SITE_OTHER): Payer: Medicaid Other | Admitting: Obstetrics and Gynecology

## 2016-02-15 VITALS — BP 125/95 | HR 100 | Wt 345.3 lb

## 2016-02-15 DIAGNOSIS — Z9889 Other specified postprocedural states: Secondary | ICD-10-CM

## 2016-02-15 NOTE — Progress Notes (Signed)
Patient ID: Jordan [name], female   DOB: August 17, 1966, 50 y.o.   MRN: [id number] 50 yo here for post op check. Patient underwent a D&C with hysteroscopy on 01/25/2016 for the evaluation of DUB. Patient reports feeling well since the procedure. She has experienced some light spotting a few days following the procedure  Past Medical History  Diagnosis Date  . Mental disorder   . Bipolar 1 disorder ([place])   . Bipolar 1 disorder ([place])   . Yeast infection of the vagina   . Bipolar disorder ([place])   . Obesity   . UTI (lower urinary tract infection)   . Alcohol abuse   . Shortness of breath dyspnea   . Anemia   . 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 Cholecystectomy    . Hysteroscopy w/d&c N/A 01/25/2016    Procedure: DILATATION AND CURETTAGE /HYSTEROSCOPY;  Surgeon: Mora Bellman, MD;  Location: [facility];  Service: Gynecology;  Laterality: N/A;   No family history on file. Social History  Substance Use Topics  . Smoking status: Never Smoker   . Smokeless tobacco: Never Used  . Alcohol Use: No   ROS See pertinent in HPI  Blood pressure 125/95, pulse 100, weight 345 lb 4.8 oz (156.627 kg). GENERAL: Well-developed, well-nourished female in no acute distress.  ABDOMEN: Soft, nontender, nondistended. Obese PELVIC: Normal external female genitalia. Vagina is pink and rugated.  Normal discharge. Normal appearing cervix. Uterus is normal in size. No adnexal mass or tenderness. EXTREMITIES: No cyanosis, clubbing, or edema, 2+ distal pulses.  A/P 50 yo here for post op check - Results of benign pathology reviewed with the patient - patient advised to keep a menstrual calendar and to return for any further episodes of DUB - RTC prn

## 2016-02-20 ENCOUNTER — Ambulatory Visit: Payer: Self-pay | Admitting: Advanced Practice Midwife

## 2016-02-20 ENCOUNTER — Encounter (HOSPITAL_COMMUNITY): Payer: Self-pay | Admitting: *Deleted

## 2016-02-20 ENCOUNTER — Inpatient Hospital Stay (HOSPITAL_COMMUNITY)
Admission: AD | Admit: 2016-02-20 | Discharge: 2016-02-20 | Disposition: A | Discharge status: Home / Self Care | Payer: Medicaid Other | Source: Ambulatory Visit | Attending: Family Medicine | Admitting: Family Medicine

## 2016-02-20 DIAGNOSIS — E669 Obesity, unspecified: Secondary | ICD-10-CM | POA: Insufficient documentation

## 2016-02-20 DIAGNOSIS — N939 Abnormal uterine and vaginal bleeding, unspecified: Secondary | ICD-10-CM | POA: Diagnosis not present

## 2016-02-20 DIAGNOSIS — N390 Urinary tract infection, site not specified: Secondary | ICD-10-CM | POA: Insufficient documentation

## 2016-02-20 DIAGNOSIS — F319 Bipolar disorder, unspecified: Secondary | ICD-10-CM | POA: Diagnosis not present

## 2016-02-20 DIAGNOSIS — N3000 Acute cystitis without hematuria: Secondary | ICD-10-CM | POA: Diagnosis not present

## 2016-02-20 LAB — URINALYSIS, ROUTINE W REFLEX MICROSCOPIC
Glucose, UA: 100 mg/dL — AB
Ketones, ur: 15 mg/dL — AB
Nitrite: POSITIVE — AB
Protein, ur: 100 mg/dL — AB
Specific Gravity, Urine: 1.02 (ref 1.005–1.030)
pH: 6.5 (ref 5.0–8.0)

## 2016-02-20 LAB — WET PREP, GENITAL
Clue Cells Wet Prep HPF POC: NONE SEEN
Sperm: NONE SEEN
Trich, Wet Prep: NONE SEEN
Yeast Wet Prep HPF POC: NONE SEEN

## 2016-02-20 LAB — URINE MICROSCOPIC-ADD ON

## 2016-02-20 LAB — POCT PREGNANCY, URINE: Preg Test, Ur: NEGATIVE

## 2016-02-20 MED ORDER — MEGESTROL ACETATE 20 MG PO TABS
40.0000 mg | ORAL_TABLET | Freq: Two times a day (BID) | ORAL | Status: DC
Start: 2016-02-20 — End: 2016-03-05

## 2016-02-20 MED ORDER — CIPROFLOXACIN HCL 250 MG PO TABS: 250.0000 mg | ORAL_TABLET | Freq: Two times a day (BID) | ORAL | Status: DC

## 2016-02-20 NOTE — Discharge Instructions (Signed)
Abnormal Uterine Bleeding Abnormal uterine bleeding means bleeding from the vagina that is not your normal menstrual period. This can be:  Bleeding or spotting between periods.  Bleeding after sex (sexual intercourse).  Bleeding that is heavier or more than normal.  Periods that last longer than usual.  Bleeding after menopause. There are many problems that may cause this. Treatment will depend on the cause of the bleeding. Any kind of bleeding that is not normal should be reviewed by your doctor.  HOME CARE Watch your condition for any changes. These actions may lessen any discomfort you are having:  Do not use tampons or douches as told by your doctor.  Change your pads often. You should get regular pelvic exams and Pap tests. Keep all appointments for tests as told by your doctor. GET HELP IF:  You are bleeding for more than 1 week.  You feel dizzy at times. GET HELP RIGHT AWAY IF:   You pass out.  You have to change pads every 15 to 30 minutes.  You have belly pain.  You have a fever.  You become sweaty or weak.  You are passing large blood clots from the vagina.  You feel sick to your stomach (nauseous) and throw up (vomit). MAKE SURE YOU:  Understand these instructions.  Will watch your condition.  Will get help right away if you are not doing well or get worse.   This information is not intended to replace advice given to you by your health care provider. Make sure you discuss any questions you have with your health care provider.   Document Released: 08/04/2009 Document Revised: 10/12/2013 Document Reviewed: 05/06/2013 Elsevier Interactive Patient Education 2016 [employer]. Asymptomatic Bacteriuria, Female Asymptomatic bacteriuria is the presence of a large number of bacteria in your urine without the usual symptoms of burning or frequent urination. The following conditions increase the risk of asymptomatic bacteriuria:  Diabetes mellitus.  Advanced  age.  Pregnancy in the first trimester.  Kidney stones.  Kidney transplants.  Leaky kidney tube valve in young children (reflux). Treatment for this condition is not needed in most people and can lead to other problems such as too much yeast and growth of resistant bacteria. However, some people, such as pregnant women, do need treatment to prevent kidney infection. Asymptomatic bacteriuria in pregnancy is also associated with fetal growth restriction, premature labor, and newborn death. HOME CARE INSTRUCTIONS Monitor your condition for any changes. The following actions may help to relieve any discomfort you are feeling:  Drink enough water and fluids to keep your urine clear or pale yellow. Go to the bathroom more often to keep your bladder empty.  Keep the area around your vagina and rectum clean. Wipe yourself from front to back after urinating. SEEK IMMEDIATE MEDICAL CARE IF:  You develop signs of an infection such as:  Burning with urination.  Frequency of voiding.  Back pain.  Fever.  You have blood in the urine.  You develop a fever. MAKE SURE YOU:  Understand these instructions.  Will watch your condition.  Will get help right away if you are not doing well or get worse.   This information is not intended to replace advice given to you by your health care provider. Make sure you discuss any questions you have with your health care provider.   Document Released: 10/07/2005 Document Revised: 10/28/2014 Document Reviewed: 03/29/2013 Elsevier Interactive Patient Education [employer].

## 2016-02-20 NOTE — MAU Note (Signed)
Has a strong odor, had surgery 4/6, d/c and ?ovarian cyst- checked for cancer.  Ever since surgery, she had noted a horrible d/c. Is going through menopause, had gone over 16months without a period prior to surgery.   Since the surgery has had a small amt of spotting.  Went to gyn last wk, was told everything was fine and she was healing well.

## 2016-02-20 NOTE — MAU Provider Note (Signed)
History     CSN: [id number]  Arrival date and time: 02/20/16 1816   First Provider Initiated Contact with Patient 02/20/16 1944      Chief Complaint  Patient presents with  . Vaginal Discharge   HPI [name] is a 50 y.o. G1P1001 who presents to MAU today with complaint of vaginal odor. The patient had hysteroscopy and D&C on 4/6 and has had continued bleeding since surgery. She states initially the bleeding was still heavy and now just spotting. She denies abdominal pain, fever, weakness, dizziness or fatigue. She also denies UTI symptoms.   OB History    Gravida Para Term Preterm AB TAB SAB Ectopic Multiple Living   1 1 1       1       Past Medical History  Diagnosis Date  . Mental disorder   . Bipolar 1 disorder ([place])   . Bipolar 1 disorder ([place])   . Yeast infection of the vagina   . Bipolar disorder ([place])   . Obesity   . UTI (lower urinary tract infection)   . Alcohol abuse   . Shortness of breath dyspnea   . Anemia   . 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 Cholecystectomy    . Hysteroscopy w/d&c N/A 01/25/2016    Procedure: DILATATION AND CURETTAGE /HYSTEROSCOPY;  Surgeon: Mora Bellman, MD;  Location: [facility];  Service: Gynecology;  Laterality: N/A;    No family history on file.  Social History  Substance Use Topics  . Smoking status: Never Smoker   . Smokeless tobacco: Never Used  . Alcohol Use: No    Allergies:  Allergies  Allergen Reactions  . Citalopram Other (See Comments)    Possible cause of pancytopenia.  . Lamotrigine Other (See Comments)    Possible cause of pancytopenia.  . Sulfa Antibiotics Anaphylaxis  . Tramadol Anaphylaxis, Shortness Of Breath and Swelling    No prescriptions prior to admission    Review of Systems  Constitutional: Negative for fever and malaise/fatigue.  Gastrointestinal: Negative for nausea, vomiting, abdominal pain, diarrhea and  constipation.  Genitourinary: Negative for dysuria, urgency and frequency.       + vaginal bleeding, odor   Physical Exam   Blood pressure 118/97, pulse 115, temperature 97.8 F (36.6 C), temperature source Oral, resp. rate 18.  Physical Exam  Nursing note and vitals reviewed. Constitutional: She is oriented to person, place, and time. She appears well-developed and well-nourished. No distress.  HENT:  Head: Normocephalic and atraumatic.  Cardiovascular: Normal rate.   Respiratory: Effort normal.  GI: Soft. She exhibits no distension and no mass. There is no tenderness. There is no rebound and no guarding.  Genitourinary: There is bleeding (small amount of blood noted) in the vagina.  Neurological: She is alert and oriented to person, place, and time.  Skin: Skin is warm and dry. No erythema.  Psychiatric: She has a normal mood and affect.    Results for orders placed or performed during the hospital encounter of 02/20/16 (from the past 24 hour(s))  Urinalysis, Routine w reflex microscopic (not at [facility])     Status: Abnormal   Collection Time: 02/20/16  6:55 PM  Result Value Ref Range   Color, Urine AMBER (A) YELLOW   APPearance CLOUDY (A) CLEAR   Specific Gravity, Urine 1.020 1.005 - 1.030   pH 6.5 5.0 - 8.0   Glucose, UA 100 (A)  NEGATIVE mg/dL   Hgb urine dipstick LARGE (A) NEGATIVE   Bilirubin Urine MODERATE (A) NEGATIVE   Ketones, ur 15 (A) NEGATIVE mg/dL   Protein, ur 100 (A) NEGATIVE mg/dL   Nitrite POSITIVE (A) NEGATIVE   Leukocytes, UA LARGE (A) NEGATIVE  Urine microscopic-add on     Status: Abnormal   Collection Time: 02/20/16  6:55 PM  Result Value Ref Range   Squamous Epithelial / LPF 6-30 (A) NONE SEEN   WBC, UA 6-30 0 - 5 WBC/hpf   RBC / HPF 0-5 0 - 5 RBC/hpf   Bacteria, UA MANY (A) NONE SEEN  Pregnancy, urine POC     Status: None   Collection Time: 02/20/16  7:05 PM  Result Value Ref Range   Preg Test, Ur NEGATIVE NEGATIVE  Wet prep, genital     Status:  Abnormal   Collection Time: 02/20/16  7:55 PM  Result Value Ref Range   Yeast Wet Prep HPF POC NONE SEEN NONE SEEN   Trich, Wet Prep NONE SEEN NONE SEEN   Clue Cells Wet Prep HPF POC NONE SEEN NONE SEEN   WBC, Wet Prep HPF POC MODERATE (A) NONE SEEN   Sperm NONE SEEN     MAU Course  Procedures None  MDM UPT -negative UA, wet prep and GC/Chlamydia today  Urine culture pending Wet prep negative, odor presumed to be a combination of continued vaginal bleeding and UTI/patient body habitus Assessment and Plan  A: UTI  Vaginal bleeding, Peri-menopausal  P: Discharge home Rx for Cipro and Megace given to patient  Bleeding precautions discussed Patient advised to follow-up with [facility] if symptoms persist or worsen Patient may return to MAU as needed or if her condition were to change or worsen   Luvenia Redden, PA-C  02/21/2016, 1:12 AM

## 2016-02-21 LAB — GC/CHLAMYDIA PROBE AMP (~~LOC~~) NOT AT ARMC
Chlamydia: NEGATIVE
Neisseria Gonorrhea: NEGATIVE

## 2016-02-22 ENCOUNTER — Ambulatory Visit: Payer: Self-pay | Admitting: Obstetrics and Gynecology

## 2016-02-22 LAB — URINE CULTURE

## 2016-02-26 ENCOUNTER — Ambulatory Visit: Payer: Self-pay | Admitting: Obstetrics & Gynecology

## 2016-03-05 ENCOUNTER — Emergency Department (HOSPITAL_COMMUNITY)
Admission: EM | Admit: 2016-03-05 | Discharge: 2016-03-05 | Disposition: A | Discharge status: Home / Self Care | Payer: Medicaid Other | Attending: Emergency Medicine | Admitting: Emergency Medicine

## 2016-03-05 DIAGNOSIS — Z792 Long term (current) use of antibiotics: Secondary | ICD-10-CM | POA: Insufficient documentation

## 2016-03-05 DIAGNOSIS — F319 Bipolar disorder, unspecified: Secondary | ICD-10-CM | POA: Diagnosis not present

## 2016-03-05 DIAGNOSIS — R109 Unspecified abdominal pain: Secondary | ICD-10-CM | POA: Insufficient documentation

## 2016-03-05 DIAGNOSIS — Z791 Long term (current) use of non-steroidal anti-inflammatories (NSAID): Secondary | ICD-10-CM | POA: Insufficient documentation

## 2016-03-05 DIAGNOSIS — R197 Diarrhea, unspecified: Secondary | ICD-10-CM | POA: Diagnosis not present

## 2016-03-05 DIAGNOSIS — E669 Obesity, unspecified: Secondary | ICD-10-CM | POA: Insufficient documentation

## 2016-03-05 DIAGNOSIS — Z79899 Other long term (current) drug therapy: Secondary | ICD-10-CM | POA: Diagnosis not present

## 2016-03-05 LAB — URINALYSIS, ROUTINE W REFLEX MICROSCOPIC
Bilirubin Urine: NEGATIVE
Glucose, UA: NEGATIVE mg/dL
Ketones, ur: NEGATIVE mg/dL
Nitrite: NEGATIVE
Protein, ur: NEGATIVE mg/dL
Specific Gravity, Urine: 1.03 (ref 1.005–1.030)
pH: 5.5 (ref 5.0–8.0)

## 2016-03-05 LAB — URINE MICROSCOPIC-ADD ON

## 2016-03-05 LAB — COMPREHENSIVE METABOLIC PANEL
ALT: 13 U/L — ABNORMAL LOW (ref 14–54)
AST: 17 U/L (ref 15–41)
Albumin: 3.9 g/dL (ref 3.5–5.0)
Alkaline Phosphatase: 117 U/L (ref 38–126)
Anion gap: 8 (ref 5–15)
BUN: 8 mg/dL (ref 6–20)
CO2: 21 mmol/L — ABNORMAL LOW (ref 22–32)
Calcium: 8.5 mg/dL — ABNORMAL LOW (ref 8.9–10.3)
Chloride: 114 mmol/L — ABNORMAL HIGH (ref 101–111)
Creatinine, Ser: 0.73 mg/dL (ref 0.44–1.00)
GFR calc Af Amer: >60 mL/min (ref >60)
GFR calc non Af Amer: >60 mL/min (ref >60)
Glucose, Bld: 109 mg/dL — ABNORMAL HIGH (ref 65–99)
Potassium: 3.5 mmol/L (ref 3.5–5.1)
Sodium: 143 mmol/L (ref 135–145)
Total Bilirubin: 0.6 mg/dL (ref 0.3–1.2)
Total Protein: 6.6 g/dL (ref 6.5–8.1)

## 2016-03-05 LAB — LIPASE, BLOOD: Lipase: 26 U/L (ref 11–51)

## 2016-03-05 LAB — CBC
HCT: 43.8 % (ref 36.0–46.0)
Hemoglobin: 15.3 g/dL — ABNORMAL HIGH (ref 12.0–15.0)
MCH: 30.2 pg (ref 26.0–34.0)
MCHC: 34.9 g/dL (ref 30.0–36.0)
MCV: 86.6 fL (ref 78.0–100.0)
Platelets: 174 10*3/uL (ref 150–400)
RBC: 5.06 MIL/uL (ref 3.87–5.11)
RDW: 13.4 % (ref 11.5–15.5)
WBC: 7.3 10*3/uL (ref 4.0–10.5)

## 2016-03-05 LAB — I-STAT BETA HCG BLOOD, ED (MC, WL, AP ONLY): I-stat hCG, quantitative: <5 m[IU]/mL (ref <5)

## 2016-03-05 MED ORDER — DICYCLOMINE HCL 10 MG PO CAPS
10.0000 mg | ORAL_CAPSULE | Freq: Once | ORAL | Status: AC
  Administered 2016-03-05: 10 mg via ORAL
  Filled 2016-03-05: qty 1

## 2016-03-05 MED ORDER — LOPERAMIDE HCL 2 MG PO CAPS: 2.0000 mg | ORAL_CAPSULE | Freq: Four times a day (QID) | ORAL | Status: DC | PRN

## 2016-03-05 MED ORDER — BENZONATATE 100 MG PO CAPS: 100.0000 mg | ORAL_CAPSULE | Freq: Three times a day (TID) | ORAL | Status: DC

## 2016-03-05 MED ORDER — LOPERAMIDE HCL 2 MG PO CAPS
4.0000 mg | ORAL_CAPSULE | Freq: Once | ORAL | Status: AC
  Administered 2016-03-05: 4 mg via ORAL
  Filled 2016-03-05: qty 2

## 2016-03-05 MED ORDER — DICYCLOMINE HCL 20 MG PO TABS: 20.0000 mg | ORAL_TABLET | Freq: Two times a day (BID) | ORAL | Status: DC

## 2016-03-05 NOTE — ED Provider Notes (Signed)
CSN: [id number]     Arrival date & time 03/05/16  1929 History   First MD Initiated Contact with Patient 03/05/16 2029     Chief Complaint  Patient presents with  . Abdominal Pain     (Consider location/radiation/quality/duration/timing/severity/associated sxs/prior Treatment) HPI Comments: Patient presents with complaint of bilateral mid-abdominal pain over the past 2 days with associated nonbloody, watery diarrhea. Abdominal pain is cramping in nature. It does not radiate. No associated fever, nausea or vomiting. No urinary symptoms. Patient states that she had an endometrial biopsy recently and continues to have some mild spotting from this. She has taken Pepto-Bismol without relief. No history of colonoscopy. No abdominal surgeries. No heavy NSAID use or alcohol use. No suspicious food or water exposures. No travel recently. Onset of symptoms acute. Course is constant. Nothing makes symptoms better or worse.  Patient is a 50 y.o. female presenting with abdominal pain. The history is provided by the patient.  Abdominal Pain Associated symptoms: diarrhea   Associated symptoms: no chest pain, no cough, no dysuria, no fever, no nausea, no sore throat and no vomiting     Past Medical History  Diagnosis Date  . Mental disorder   . Bipolar 1 disorder ([place])   . Bipolar 1 disorder ([place])   . Yeast infection of the vagina   . Bipolar disorder ([place])   . Obesity   . UTI (lower urinary tract infection)   . Alcohol abuse   . Shortness of breath dyspnea   . Anemia   . 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 Cholecystectomy    . Hysteroscopy w/d&c N/A 01/25/2016    Procedure: DILATATION AND CURETTAGE /HYSTEROSCOPY;  Surgeon: Mora Bellman, MD;  Location: [facility];  Service: Gynecology;  Laterality: N/A;   No family history on file. Social History  Substance Use Topics  . Smoking status: Never Smoker   . Smokeless  tobacco: Never Used  . Alcohol Use: No   OB History    Gravida Para Term Preterm AB TAB SAB Ectopic Multiple Living   1 1 1       1      Review of Systems  Constitutional: Negative for fever.  HENT: Negative for rhinorrhea and sore throat.   Eyes: Negative for redness.  Respiratory: Negative for cough.   Cardiovascular: Negative for chest pain.  Gastrointestinal: Positive for abdominal pain and diarrhea. Negative for nausea, vomiting and blood in stool.  Genitourinary: Negative for dysuria.  Musculoskeletal: Negative for myalgias.  Skin: Negative for rash.  Neurological: Negative for headaches.      Allergies  Citalopram; Lamotrigine; Sulfa antibiotics; and Tramadol  Home Medications   Prior to Admission medications   Medication Sig Start Date End Date Taking? Authorizing Provider  acetaminophen (TYLENOL) 500 MG tablet Take 1,000 mg by mouth every 6 (six) hours as needed for moderate pain.   Yes Historical Provider, MD  ciprofloxacin (CIPRO) 250 MG tablet Take 1 tablet (250 mg total) by mouth every 12 (twelve) hours. Patient not taking: Reported on 03/05/2016 02/20/16   Luvenia Redden, PA-C  megestrol (MEGACE) 20 MG tablet Take 2 tablets (40 mg total) by mouth 2 (two) times daily. Patient not taking: Reported on 03/05/2016 02/20/16   Luvenia Redden, PA-C  naproxen (NAPROSYN) 375 MG tablet Take 1 tablet (375 mg total) by mouth 2 (two) times daily as needed for moderate pain. Take with meals. Patient not taking:  Reported on 03/05/2016 02/10/16   Duffy Bruce, MD   BP 148/98 mmHg  Pulse 110  Temp(Src) 98.2 F (36.8 C) (Oral)  Resp 18  SpO2 93% Physical Exam  Constitutional: She appears well-developed and well-nourished.  HENT:  Head: Normocephalic and atraumatic.  Eyes: Conjunctivae are normal. Right eye exhibits no discharge. Left eye exhibits no discharge.  Neck: Normal range of motion. Neck supple.  Cardiovascular: Normal rate, regular rhythm and normal heart sounds.    Pulmonary/Chest: Effort normal and breath sounds normal.  Abdominal: Soft. Bowel sounds are normal. There is tenderness (Mild, mid abdomen lateral to the umbilicus bilaterally). There is no rebound and no guarding.  Neurological: She is alert.  Skin: Skin is warm and dry.  Psychiatric: She has a normal mood and affect.  Nursing note and vitals reviewed.   ED Course  Procedures (including critical care time) Labs Review Labs Reviewed  COMPREHENSIVE METABOLIC PANEL - Abnormal; Notable for the following:    Chloride 114 (*)    CO2 21 (*)    Glucose, Bld 109 (*)    Calcium 8.5 (*)    ALT 13 (*)    All other components within normal limits  CBC - Abnormal; Notable for the following:    Hemoglobin 15.3 (*)    All other components within normal limits  URINALYSIS, ROUTINE W REFLEX MICROSCOPIC (NOT AT [facility]) - Abnormal; Notable for the following:    Color, Urine AMBER (*)    APPearance CLOUDY (*)    Hgb urine dipstick LARGE (*)    Leukocytes, UA SMALL (*)    All other components within normal limits  URINE MICROSCOPIC-ADD ON - Abnormal; Notable for the following:    Squamous Epithelial / LPF 6-30 (*)    Bacteria, UA FEW (*)    All other components within normal limits  LIPASE, BLOOD  I-STAT BETA HCG BLOOD, ED ([facility], [facility], [facility] ONLY)    9:03 PM Patient seen and examined.   Vital signs reviewed and are as follows: BP 148/98 mmHg  Pulse 110  Temp(Src) 98.2 F (36.8 C) (Oral)  Resp 18  SpO2 93%  Reviewed findings with patient and husband at bedside. Exam is relatively benign. At this point, would not advise CT or further imaging. Would treat her diarrhea symptoms and abdominal cramps. Encouraged PCP follow-up for recheck in the next 48 hours.  The patient was urged to return to the Emergency Department immediately with worsening of current symptoms, worsening abdominal pain, persistent vomiting, blood noted in stools, fever, or any other concerns. The patient verbalized understanding.    Patient given Sprite to drink at her request.   MDM   Final diagnoses:  Abdominal cramping  Diarrhea, unspecified type   Patient with 2 days of abdominal cramping pain and diarrhea. No fevers or vomiting. Exam is relatively benign. I reassuring. Patient does have hematuria which is ongoing, likely contaminant from vaginal bleeding which is being evaluated. Patient does not have any other irritative urinary tract infection symptoms. No signs of obstruction. Vitals are stable, pre-existing tachycardia noted that appears to be patient's baseline. no fever. No signs of dehydration, patient is tolerating PO's. Lungs are clear and no signs suggestive of PNA. Low concern for appendicitis, cholecystitis, pancreatitis, ruptured viscus, UTI, kidney stone, aortic dissection, aortic aneurysm or other emergent abdominal etiology. Supportive therapy indicated with return if symptoms worsen.       Carlisle Cater, PA-C 03/05/16 2139  Davonna Belling, MD 03/06/16 1105

## 2016-03-05 NOTE — ED Notes (Signed)
Pt states she has had abdominal pain x 2 days. Diarrhea. Alert and oriented.

## 2016-03-05 NOTE — Discharge Instructions (Signed)
Please read and follow all provided instructions.  Your diagnoses today include:  1. Abdominal cramping   2. Diarrhea, unspecified type     Tests performed today include:  Blood counts and electrolytes  Blood tests to check liver and kidney function  Blood tests to check pancreas function  Urine test to look for infection and pregnancy (in women)  Vital signs. See below for your results today.   Medications prescribed:   Bentyl - medication for intestinal cramps and spasms  Take any prescribed medications only as directed.  Home care instructions:   Follow any educational materials contained in this packet.  Follow-up instructions: Please follow-up with your primary care provider in the next 2 days for further evaluation of your symptoms.    Return instructions:  SEEK IMMEDIATE MEDICAL ATTENTION IF:  The pain does not go away or becomes severe   A temperature above 101F develops   Repeated vomiting occurs (multiple episodes)   The pain becomes localized to portions of the abdomen. The right side could possibly be appendicitis. In an adult, the left lower portion of the abdomen could be colitis or diverticulitis.   Blood is being passed in stools or vomit (bright red or black tarry stools)   You develop chest pain, difficulty breathing, dizziness or fainting, or become confused, poorly responsive, or inconsolable (young children)  If you have any other emergent concerns regarding your health  Additional Information: Abdominal (belly) pain can be caused by many things. Your caregiver performed an examination and possibly ordered blood/urine tests and imaging (CT scan, x-rays, ultrasound). Many cases can be observed and treated at home after initial evaluation in the emergency department. Even though you are being discharged home, abdominal pain can be unpredictable. Therefore, you need a repeated exam if your pain does not resolve, returns, or worsens. Most patients  with abdominal pain don't have to be admitted to the hospital or have surgery, but serious problems like appendicitis and gallbladder attacks can start out as nonspecific pain. Many abdominal conditions cannot be diagnosed in one visit, so follow-up evaluations are very important.  Your vital signs today were: BP 148/98 mmHg   Pulse 110   Temp(Src) 98.2 F (36.8 C) (Oral)   Resp 18   SpO2 93% If your blood pressure (bp) was elevated above 135/85 this visit, please have this repeated by your doctor within one month. --------------

## 2016-03-09 ENCOUNTER — Inpatient Hospital Stay (HOSPITAL_COMMUNITY)
Admission: AD | Admit: 2016-03-09 | Discharge: 2016-03-09 | Disposition: A | Discharge status: Home / Self Care | Payer: Medicaid Other | Source: Ambulatory Visit | Attending: Obstetrics & Gynecology | Admitting: Obstetrics & Gynecology

## 2016-03-09 ENCOUNTER — Encounter (HOSPITAL_COMMUNITY): Payer: Self-pay | Admitting: *Deleted

## 2016-03-09 DIAGNOSIS — Z888 Allergy status to other drugs, medicaments and biological substances status: Secondary | ICD-10-CM | POA: Diagnosis not present

## 2016-03-09 DIAGNOSIS — N898 Other specified noninflammatory disorders of vagina: Secondary | ICD-10-CM | POA: Diagnosis present

## 2016-03-09 DIAGNOSIS — Z87440 Personal history of urinary (tract) infections: Secondary | ICD-10-CM | POA: Insufficient documentation

## 2016-03-09 DIAGNOSIS — B9689 Other specified bacterial agents as the cause of diseases classified elsewhere: Secondary | ICD-10-CM | POA: Insufficient documentation

## 2016-03-09 DIAGNOSIS — Z882 Allergy status to sulfonamides status: Secondary | ICD-10-CM | POA: Diagnosis not present

## 2016-03-09 DIAGNOSIS — N760 Acute vaginitis: Secondary | ICD-10-CM | POA: Diagnosis not present

## 2016-03-09 DIAGNOSIS — A499 Bacterial infection, unspecified: Secondary | ICD-10-CM | POA: Diagnosis not present

## 2016-03-09 LAB — WET PREP, GENITAL
Sperm: NONE SEEN
Trich, Wet Prep: NONE SEEN
Yeast Wet Prep HPF POC: NONE SEEN

## 2016-03-09 LAB — URINALYSIS, ROUTINE W REFLEX MICROSCOPIC
Bilirubin Urine: NEGATIVE
Glucose, UA: NEGATIVE mg/dL
Ketones, ur: NEGATIVE mg/dL
Nitrite: POSITIVE — AB
Protein, ur: NEGATIVE mg/dL
Specific Gravity, Urine: 1.025 (ref 1.005–1.030)
pH: 5.5 (ref 5.0–8.0)

## 2016-03-09 LAB — URINE MICROSCOPIC-ADD ON

## 2016-03-09 LAB — POCT PREGNANCY, URINE: Preg Test, Ur: NEGATIVE

## 2016-03-09 MED ORDER — METRONIDAZOLE 500 MG PO TABS: 500.0000 mg | ORAL_TABLET | Freq: Two times a day (BID) | ORAL | Status: DC

## 2016-03-09 MED ORDER — METRONIDAZOLE 500 MG PO TABS
500.0000 mg | ORAL_TABLET | Freq: Two times a day (BID) | ORAL | Status: DC
Start: 2016-03-09 — End: 2016-03-09

## 2016-03-09 NOTE — MAU Note (Signed)
Pt had a D&C and an endometrial biopsy on April 6th.  She is here today because of a strong vaginal odor.  No cramping or other complaints.

## 2016-03-09 NOTE — MAU Provider Note (Signed)
History     CSN: [id number]  Arrival date and time: 03/09/16 1712   First Provider Initiated Contact with Patient 03/09/16 1747      Chief Complaint  Patient presents with  . vaginal odor    HPI   Ms.Gloretta Pfuhl is a 50 y.o. female G1P1001 here with vaginal odor X 5 days.  She is a patient of the [facility]; they are unaware of this issue.  She has been putting bleach in her bath water at home due to the odor she is experiencing.   OB History    Gravida Para Term Preterm AB TAB SAB Ectopic Multiple Living   1 1 1       1       Past Medical History  Diagnosis Date  . Mental disorder   . Bipolar 1 disorder ([place])   . Bipolar 1 disorder ([place])   . Yeast infection of the vagina   . Bipolar disorder ([place])   . Obesity   . UTI (lower urinary tract infection)   . Alcohol abuse   . Shortness of breath dyspnea   . Anemia   . 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 Cholecystectomy    . Hysteroscopy w/d&c N/A 01/25/2016    Procedure: DILATATION AND CURETTAGE /HYSTEROSCOPY;  Surgeon: Mora Bellman, MD;  Location: [facility];  Service: Gynecology;  Laterality: N/A;    History reviewed. No pertinent family history.  Social History  Substance Use Topics  . Smoking status: Never Smoker   . Smokeless tobacco: Never Used  . Alcohol Use: No    Allergies:  Allergies  Allergen Reactions  . Citalopram Other (See Comments)    Possible cause of pancytopenia.  . Lamotrigine Other (See Comments)    Possible cause of pancytopenia.  . Sulfa Antibiotics Anaphylaxis  . Tramadol Anaphylaxis, Shortness Of Breath and Swelling    Prescriptions prior to admission  Medication Sig Dispense Refill Last Dose  . acetaminophen (TYLENOL) 500 MG tablet Take 1,000 mg by mouth every 6 (six) hours as needed for moderate pain.   Past Week at Unknown time  . benzonatate (TESSALON) 100 MG capsule Take 1 capsule (100 mg total) by mouth  every 8 (eight) hours. (Patient not taking: Reported on 03/09/2016) 15 capsule 0   . dicyclomine (BENTYL) 20 MG tablet Take 1 tablet (20 mg total) by mouth 2 (two) times daily. (Patient not taking: Reported on 03/09/2016) 20 tablet 0   . loperamide (IMODIUM) 2 MG capsule Take 1 capsule (2 mg total) by mouth 4 (four) times daily as needed for diarrhea or loose stools. (Patient not taking: Reported on 03/09/2016) 12 capsule 0    Results for orders placed or performed during the hospital encounter of 03/09/16 (from the past 48 hour(s))  Urinalysis, Routine w reflex microscopic (not at [facility])     Status: Abnormal   Collection Time: 03/09/16  5:12 PM  Result Value Ref Range   Color, Urine YELLOW YELLOW   APPearance CLEAR CLEAR   Specific Gravity, Urine 1.025 1.005 - 1.030   pH 5.5 5.0 - 8.0   Glucose, UA NEGATIVE NEGATIVE mg/dL   Hgb urine dipstick LARGE (A) NEGATIVE   Bilirubin Urine NEGATIVE NEGATIVE   Ketones, ur NEGATIVE NEGATIVE mg/dL   Protein, ur NEGATIVE NEGATIVE mg/dL   Nitrite POSITIVE (A) NEGATIVE   Leukocytes, UA SMALL (A) NEGATIVE  Urine microscopic-add on     Status:  Abnormal   Collection Time: 03/09/16  5:12 PM  Result Value Ref Range   Squamous Epithelial / LPF 6-30 (A) NONE SEEN   WBC, UA 6-30 0 - 5 WBC/hpf   RBC / HPF 0-5 0 - 5 RBC/hpf   Bacteria, UA MANY (A) NONE SEEN   Urine-Other MUCOUS PRESENT   Pregnancy, urine POC     Status: None   Collection Time: 03/09/16  5:25 PM  Result Value Ref Range   Preg Test, Ur NEGATIVE NEGATIVE    Comment:        THE SENSITIVITY OF THIS METHODOLOGY IS >24 mIU/mL   Wet prep, genital     Status: Abnormal   Collection Time: 03/09/16  5:59 PM  Result Value Ref Range   Yeast Wet Prep HPF POC NONE SEEN NONE SEEN   Trich, Wet Prep NONE SEEN NONE SEEN   Clue Cells Wet Prep HPF POC PRESENT (A) NONE SEEN   WBC, Wet Prep HPF POC FEW (A) NONE SEEN    Comment: FEW BACTERIA SEEN   Sperm NONE SEEN     Review of Systems  Constitutional:  Negative for fever and chills.  Gastrointestinal: Negative for nausea, vomiting, abdominal pain and diarrhea.  Genitourinary: Negative for dysuria, urgency, frequency, hematuria and flank pain.   Physical Exam   Blood pressure 127/86, pulse 120, temperature 98.7 F (37.1 C), temperature source Oral, resp. rate 18.  Physical Exam  Constitutional: She is oriented to person, place, and time. She appears well-developed and well-nourished. No distress.  HENT:  Head: Normocephalic.  Eyes: Pupils are equal, round, and reactive to light.  Respiratory: Effort normal.  Genitourinary:  Wet prep collected by RN   Musculoskeletal: Normal range of motion.  Neurological: She is alert and oriented to person, place, and time.  Skin: Skin is warm. She is not diaphoretic.  Psychiatric: Her speech is delayed. She is slowed.    MAU Course  Procedures  None  MDM  Wet prep  UA Urine culture: patient denies UTI symptoms; declines treatment despite UA result.  Assessment and Plan   A:  1. BV (bacterial vaginosis)     P:  Discharge home in stable condition Rx: Flagyl- no alcohol  Return to MAU for emergencies    Lezlie Lye, NP 03/09/2016 7:19 PM

## 2016-03-09 NOTE — Discharge Instructions (Signed)
Bacterial Vaginosis °Bacterial vaginosis is a vaginal infection that occurs when the normal balance of bacteria in the vagina is disrupted. It results from an overgrowth of certain bacteria. This is the most common vaginal infection in women of childbearing age. Treatment is important to prevent complications, especially in pregnant women, as it can cause a premature delivery. °CAUSES  °Bacterial vaginosis is caused by an increase in harmful bacteria that are normally present in smaller amounts in the vagina. Several different kinds of bacteria can cause bacterial vaginosis. However, the reason that the condition develops is not fully understood. °RISK FACTORS °Certain activities or behaviors can put you at an increased risk of developing bacterial vaginosis, including: °· Having a new sex partner or multiple sex partners. °· Douching. °· Using an intrauterine device (IUD) for contraception. °Women do not get bacterial vaginosis from toilet seats, bedding, swimming pools, or contact with objects around them. °SIGNS AND SYMPTOMS  °Some women with bacterial vaginosis have no signs or symptoms. Common symptoms include: °· Grey vaginal discharge. °· A fishlike odor with discharge, especially after sexual intercourse. °· Itching or burning of the vagina and vulva. °· Burning or pain with urination. °DIAGNOSIS  °Your health care provider will take a medical history and examine the vagina for signs of bacterial vaginosis. A sample of vaginal fluid may be taken. Your health care provider will look at this sample under a microscope to check for bacteria and abnormal cells. A vaginal pH test may also be done.  °TREATMENT  °Bacterial vaginosis may be treated with antibiotic medicines. These may be given in the form of a pill or a vaginal cream. A second round of antibiotics may be prescribed if the condition comes back after treatment. Because bacterial vaginosis increases your risk for sexually transmitted diseases, getting  treated can help reduce your risk for chlamydia, gonorrhea, HIV, and herpes. °HOME CARE INSTRUCTIONS  °· Only take over-the-counter or prescription medicines as directed by your health care provider. °· If antibiotic medicine was prescribed, take it as directed. Make sure you finish it even if you start to feel better. °· Tell all sexual partners that you have a vaginal infection. They should see their health care provider and be treated if they have problems, such as a mild rash or itching. °· During treatment, it is important that you follow these instructions: °¨ Avoid sexual activity or use condoms correctly. °¨ Do not douche. °¨ Avoid alcohol as directed by your health care provider. °¨ Avoid breastfeeding as directed by your health care provider. °SEEK MEDICAL CARE IF:  °· Your symptoms are not improving after 3 days of treatment. °· You have increased discharge or pain. °· You have a fever. °MAKE SURE YOU:  °· Understand these instructions. °· Will watch your condition. °· Will get help right away if you are not doing well or get worse. °FOR MORE INFORMATION  °Centers for Disease Control and Prevention, Division of STD Prevention: www.cdc.gov/std °American Sexual Health Association (ASHA): www.ashastd.org  °  °This information is not intended to replace advice given to you by your health care provider. Make sure you discuss any questions you have with your health care provider. °  °Document Released: 10/07/2005 Document Revised: 10/28/2014 Document Reviewed: 05/19/2013 °Elsevier Interactive Patient Education ©2016 Elsevier Inc. ° °

## 2016-03-12 LAB — URINE CULTURE: Culture: >=100000 — AB

## 2016-03-27 ENCOUNTER — Other Ambulatory Visit: Payer: Self-pay

## 2016-03-27 ENCOUNTER — Encounter: Payer: Self-pay | Admitting: Obstetrics & Gynecology

## 2016-03-27 ENCOUNTER — Ambulatory Visit (INDEPENDENT_AMBULATORY_CARE_PROVIDER_SITE_OTHER): Payer: Medicaid Other | Admitting: Obstetrics & Gynecology

## 2016-03-27 VITALS — BP 137/84 | HR 93 | Ht 68.0 in | Wt 350.5 lb

## 2016-03-27 DIAGNOSIS — B9689 Other specified bacterial agents as the cause of diseases classified elsewhere: Secondary | ICD-10-CM | POA: Insufficient documentation

## 2016-03-27 DIAGNOSIS — N760 Acute vaginitis: Secondary | ICD-10-CM

## 2016-03-27 DIAGNOSIS — N390 Urinary tract infection, site not specified: Secondary | ICD-10-CM

## 2016-03-27 DIAGNOSIS — A499 Bacterial infection, unspecified: Secondary | ICD-10-CM | POA: Diagnosis not present

## 2016-03-27 DIAGNOSIS — N938 Other specified abnormal uterine and vaginal bleeding: Secondary | ICD-10-CM | POA: Diagnosis present

## 2016-03-27 LAB — POCT URINALYSIS DIP (DEVICE)
Bilirubin Urine: NEGATIVE
Glucose, UA: NEGATIVE mg/dL
Ketones, ur: NEGATIVE mg/dL
Nitrite: POSITIVE — AB
Protein, ur: NEGATIVE mg/dL
Specific Gravity, Urine: 1.025 (ref 1.005–1.030)
Urobilinogen, UA: 1 mg/dL (ref 0.0–1.0)
pH: 6 (ref 5.0–8.0)

## 2016-03-27 MED ORDER — FOSFOMYCIN TROMETHAMINE 3 G PO PACK: 3.0000 g | PACK | Freq: Once | ORAL | Status: DC

## 2016-03-27 MED ORDER — METRONIDAZOLE 500 MG PO TABS: 500.0000 mg | ORAL_TABLET | Freq: Two times a day (BID) | ORAL | Status: DC

## 2016-03-27 NOTE — Progress Notes (Signed)
Patient ID: Jordan [name], female   DOB: 04/11/1966, 50 y.o.   MRN: [id number]  Chief Complaint  Patient presents with  . Urinary Tract Infection  . Vaginitis    vaginal odor    HPI Jordan [name] is a 50 y.o. female.  Patient was diagnosed with UTI with multiple resistance and BV and she has not received treatment. UTI sx and vaginal discharge and odor persist.  HPI  Past Medical History  Diagnosis Date  . Mental disorder   . Bipolar 1 disorder ([place])   . Bipolar 1 disorder ([place])   . Yeast infection of the vagina   . Bipolar disorder ([place])   . Obesity   . UTI (lower urinary tract infection)   . Alcohol abuse   . Shortness of breath dyspnea   . Anemia   . 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 Cholecystectomy    . Hysteroscopy w/d&c N/A 01/25/2016    Procedure: DILATATION AND CURETTAGE /HYSTEROSCOPY;  Surgeon: Mora Bellman, MD;  Location: [facility];  Service: Gynecology;  Laterality: N/A;    No family history on file.  Social History Social History  Substance Use Topics  . Smoking status: Never Smoker   . Smokeless tobacco: Never Used  . Alcohol Use: No    Allergies  Allergen Reactions  . Citalopram Other (See Comments)    Possible cause of pancytopenia.  . Lamotrigine Other (See Comments)    Possible cause of pancytopenia.  . Sulfa Antibiotics Anaphylaxis  . Tramadol Anaphylaxis, Shortness Of Breath and Swelling    Current Outpatient Prescriptions  Medication Sig Dispense Refill  . acetaminophen (TYLENOL) 500 MG tablet Take 1,000 mg by mouth every 6 (six) hours as needed for moderate pain. Reported on 03/27/2016    . benzonatate (TESSALON) 100 MG capsule Take 1 capsule (100 mg total) by mouth every 8 (eight) hours. (Patient not taking: Reported on 03/09/2016) 15 capsule 0  . dicyclomine (BENTYL) 20 MG tablet Take 1 tablet (20 mg total) by mouth 2 (two) times daily. (Patient not taking: Reported  on 03/09/2016) 20 tablet 0  . fosfomycin (MONUROL) 3 g PACK Take 3 g by mouth once. 1 g 0  . loperamide (IMODIUM) 2 MG capsule Take 1 capsule (2 mg total) by mouth 4 (four) times daily as needed for diarrhea or loose stools. (Patient not taking: Reported on 03/09/2016) 12 capsule 0  . metroNIDAZOLE (FLAGYL) 500 MG tablet Take 1 tablet (500 mg total) by mouth 2 (two) times daily. 14 tablet 0   No current facility-administered medications for this visit.    Review of Systems Review of Systems  Constitutional: Negative.   Gastrointestinal: Negative.   Genitourinary: Positive for dysuria, frequency and vaginal discharge.  Skin: Negative.     Blood pressure 137/84, pulse 93, height 5\' 8"  (1.727 m), weight 350 lb 8 oz (158.986 kg).  Physical Exam Physical Exam  Constitutional: She appears well-developed. No distress.  Pulmonary/Chest: Effort normal.  Genitourinary:  Pelvic deferred  Psychiatric: She has a normal mood and affect. Her behavior is normal.    Data Reviewed Urine culture and wet prep Notes in chart Assessment    BV   UTI  Plan   Monurol . Flagyl Dx and her questions were answered       ARNOLD,JAMES 03/27/2016, 3:28 PM

## 2016-03-28 LAB — FOLLICLE STIMULATING HORMONE: FSH: 9.3 m[IU]/mL

## 2016-04-01 ENCOUNTER — Emergency Department (HOSPITAL_COMMUNITY)
Admission: EM | Admit: 2016-04-01 | Discharge: 2016-04-01 | Disposition: A | Discharge status: Home / Self Care | Payer: Medicaid Other | Attending: Emergency Medicine | Admitting: Emergency Medicine

## 2016-04-01 ENCOUNTER — Encounter (HOSPITAL_COMMUNITY): Payer: Self-pay | Admitting: *Deleted

## 2016-04-01 ENCOUNTER — Telehealth: Payer: Self-pay | Admitting: General Practice

## 2016-04-01 DIAGNOSIS — N760 Acute vaginitis: Secondary | ICD-10-CM

## 2016-04-01 DIAGNOSIS — F319 Bipolar disorder, unspecified: Secondary | ICD-10-CM | POA: Insufficient documentation

## 2016-04-01 DIAGNOSIS — Z79899 Other long term (current) drug therapy: Secondary | ICD-10-CM | POA: Diagnosis not present

## 2016-04-01 DIAGNOSIS — B9689 Other specified bacterial agents as the cause of diseases classified elsewhere: Secondary | ICD-10-CM

## 2016-04-01 DIAGNOSIS — M545 Low back pain: Secondary | ICD-10-CM | POA: Diagnosis present

## 2016-04-01 LAB — URINE MICROSCOPIC-ADD ON

## 2016-04-01 LAB — URINALYSIS, ROUTINE W REFLEX MICROSCOPIC
Bilirubin Urine: NEGATIVE
Glucose, UA: NEGATIVE mg/dL
Ketones, ur: NEGATIVE mg/dL
Nitrite: NEGATIVE
Protein, ur: NEGATIVE mg/dL
Specific Gravity, Urine: 1.029 (ref 1.005–1.030)
pH: 5.5 (ref 5.0–8.0)

## 2016-04-01 MED ORDER — MORPHINE SULFATE (PF) 2 MG/ML IV SOLN
2.0000 mg | Freq: Once | INTRAVENOUS | Status: AC
  Administered 2016-04-01: 2 mg via INTRAVENOUS
  Filled 2016-04-01: qty 1

## 2016-04-01 MED ORDER — METRONIDAZOLE 500 MG PO TABS: 500.0000 mg | ORAL_TABLET | Freq: Once | ORAL | Status: DC

## 2016-04-01 MED ORDER — METRONIDAZOLE 500 MG PO TABS
500.0000 mg | ORAL_TABLET | Freq: Once | ORAL | Status: AC
  Administered 2016-04-01: 500 mg via ORAL
  Filled 2016-04-01: qty 1

## 2016-04-01 NOTE — Telephone Encounter (Signed)
Pt informed of results.

## 2016-04-01 NOTE — ED Notes (Signed)
Pt reports lower back and vaginal odor for about 1 week. Pt says she was recently treated for UTI by her obgyn. Denies vaginal discharge or urinary symptoms

## 2016-04-01 NOTE — Telephone Encounter (Signed)
Patient called into front office requesting results. Patient's call was connected. Patient placed me on hold & then phone disconnected.

## 2016-04-01 NOTE — Discharge Instructions (Signed)
Bacterial Vaginosis °Bacterial vaginosis is a vaginal infection that occurs when the normal balance of bacteria in the vagina is disrupted. It results from an overgrowth of certain bacteria. This is the most common vaginal infection in women of childbearing age. Treatment is important to prevent complications, especially in pregnant women, as it can cause a premature delivery. °CAUSES  °Bacterial vaginosis is caused by an increase in harmful bacteria that are normally present in smaller amounts in the vagina. Several different kinds of bacteria can cause bacterial vaginosis. However, the reason that the condition develops is not fully understood. °RISK FACTORS °Certain activities or behaviors can put you at an increased risk of developing bacterial vaginosis, including: °· Having a new sex partner or multiple sex partners. °· Douching. °· Using an intrauterine device (IUD) for contraception. °Women do not get bacterial vaginosis from toilet seats, bedding, swimming pools, or contact with objects around them. °SIGNS AND SYMPTOMS  °Some women with bacterial vaginosis have no signs or symptoms. Common symptoms include: °· Grey vaginal discharge. °· A fishlike odor with discharge, especially after sexual intercourse. °· Itching or burning of the vagina and vulva. °· Burning or pain with urination. °DIAGNOSIS  °Your health care provider will take a medical history and examine the vagina for signs of bacterial vaginosis. A sample of vaginal fluid may be taken. Your health care provider will look at this sample under a microscope to check for bacteria and abnormal cells. A vaginal pH test may also be done.  °TREATMENT  °Bacterial vaginosis may be treated with antibiotic medicines. These may be given in the form of a pill or a vaginal cream. A second round of antibiotics may be prescribed if the condition comes back after treatment. Because bacterial vaginosis increases your risk for sexually transmitted diseases, getting  treated can help reduce your risk for chlamydia, gonorrhea, HIV, and herpes. °HOME CARE INSTRUCTIONS  °· Only take over-the-counter or prescription medicines as directed by your health care provider. °· If antibiotic medicine was prescribed, take it as directed. Make sure you finish it even if you start to feel better. °· Tell all sexual partners that you have a vaginal infection. They should see their health care provider and be treated if they have problems, such as a mild rash or itching. °· During treatment, it is important that you follow these instructions: °¨ Avoid sexual activity or use condoms correctly. °¨ Do not douche. °¨ Avoid alcohol as directed by your health care provider. °¨ Avoid breastfeeding as directed by your health care provider. °SEEK MEDICAL CARE IF:  °· Your symptoms are not improving after 3 days of treatment. °· You have increased discharge or pain. °· You have a fever. °MAKE SURE YOU:  °· Understand these instructions. °· Will watch your condition. °· Will get help right away if you are not doing well or get worse. °FOR MORE INFORMATION  °Centers for Disease Control and Prevention, Division of STD Prevention: www.cdc.gov/std °American Sexual Health Association (ASHA): www.ashastd.org  °  °This information is not intended to replace advice given to you by your health care provider. Make sure you discuss any questions you have with your health care provider. °  °Document Released: 10/07/2005 Document Revised: 10/28/2014 Document Reviewed: 05/19/2013 °Elsevier Interactive Patient Education ©2016 Elsevier Inc. ° °

## 2016-04-04 NOTE — ED Provider Notes (Signed)
CSN: [id number]     Arrival date & time 04/01/16  2010 History   First MD Initiated Contact with Patient 04/01/16 2211     Chief Complaint  Patient presents with  . Back Pain      HPI Pt reports lower back and vaginal odor for about 1 week. Pt says she was recently treated for UTI by her obgyn.  Patient has significant vaginal discharge with strong smelling odor.  Patient denies fever chills.  Has no significant lower abdominal pain.  No vomiting..  Patient states they were supposed to call her in a prescription for Flagyl but they did not.  She is not had Flagyl. Past Medical History  Diagnosis Date  . Mental disorder   . Bipolar 1 disorder ([place])   . Bipolar 1 disorder ([place])   . Yeast infection of the vagina   . Bipolar disorder ([place])   . Obesity   . UTI (lower urinary tract infection)   . Alcohol abuse   . Shortness of breath dyspnea   . Anemia   . 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 Cholecystectomy    . Hysteroscopy w/d&c N/A 01/25/2016    Procedure: DILATATION AND CURETTAGE /HYSTEROSCOPY;  Surgeon: Mora Bellman, MD;  Location: [facility];  Service: Gynecology;  Laterality: N/A;   No family history on file. Social History  Substance Use Topics  . Smoking status: Never Smoker   . Smokeless tobacco: Never Used  . Alcohol Use: No   OB History    Gravida Para Term Preterm AB TAB SAB Ectopic Multiple Living   1 1 1       1      Review of Systems  All other systems reviewed and are negative  Allergies  Citalopram; Lamotrigine; Sulfa antibiotics; and Tramadol  Home Medications   Prior to Admission medications   Medication Sig Start Date End Date Taking? Authorizing Provider  acetaminophen (TYLENOL) 500 MG tablet Take 1,000 mg by mouth every 6 (six) hours as needed for moderate pain. Reported on 03/27/2016   Yes Historical Provider, MD  terbinafine (LAMISIL) 250 MG tablet Take 250 mg by mouth  daily. 03/20/16  Yes Historical Provider, MD  benzonatate (TESSALON) 100 MG capsule Take 1 capsule (100 mg total) by mouth every 8 (eight) hours. Patient not taking: Reported on 03/09/2016 03/05/16   Carlisle Cater, PA-C  dicyclomine (BENTYL) 20 MG tablet Take 1 tablet (20 mg total) by mouth 2 (two) times daily. Patient not taking: Reported on 03/09/2016 03/05/16   Carlisle Cater, PA-C  fosfomycin (MONUROL) 3 g PACK Take 3 g by mouth once. Patient not taking: Reported on 04/01/2016 03/27/16   Woodroe Mode, MD  loperamide (IMODIUM) 2 MG capsule Take 1 capsule (2 mg total) by mouth 4 (four) times daily as needed for diarrhea or loose stools. Patient not taking: Reported on 03/09/2016 03/05/16   Carlisle Cater, PA-C  metroNIDAZOLE (FLAGYL) 500 MG tablet Take 1 tablet (500 mg total) by mouth once. 04/01/16   Leonard Schwartz, MD   BP 131/75 mmHg  Pulse 99  Temp(Src) 98.5 F (36.9 C) (Oral)  Resp 18  SpO2 100%  LMP  (LMP Unknown) Physical Exam Physical Exam  Nursing note and vitals reviewed. Constitutional: She is oriented to person, place, and time. She appears well-developed and well-nourished. No distress.  HENT:  Head: Normocephalic and atraumatic.  Eyes: Pupils are equal, round, and reactive to light.  Neck: Normal range of motion.  Cardiovascular: Normal rate and intact distal pulses.   Pulmonary/Chest: No respiratory distress.  Abdominal: Normal appearance. She exhibits no distension.  Musculoskeletal: Normal range of motion.  Neurological: She is alert and oriented to person, place, and time. No cranial nerve deficit.  Skin: Skin is warm and dry. No rash noted.  Psychiatric: She has a normal mood and affect. Her behavior is normal.   ED Course  Procedures (including critical care time) Medications  morphine 2 MG/ML injection 2 mg (2 mg Intravenous Given 04/01/16 2301)  metroNIDAZOLE (FLAGYL) tablet 500 mg (500 mg Oral Given 04/01/16 2300)    Labs Review Labs Reviewed  URINALYSIS, ROUTINE  W REFLEX MICROSCOPIC (NOT AT [facility]) - Abnormal; Notable for the following:    Color, Urine AMBER (*)    APPearance CLOUDY (*)    Hgb urine dipstick LARGE (*)    Leukocytes, UA MODERATE (*)    All other components within normal limits  URINE MICROSCOPIC-ADD ON - Abnormal; Notable for the following:    Squamous Epithelial / LPF 6-30 (*)    Bacteria, UA FEW (*)    All other components within normal limits       MDM   Final diagnoses:  BV (bacterial vaginosis)        Leonard Schwartz, MD 04/04/16 1610

## 2016-04-11 ENCOUNTER — Ambulatory Visit (INDEPENDENT_AMBULATORY_CARE_PROVIDER_SITE_OTHER): Payer: Medicaid Other | Admitting: Obstetrics and Gynecology

## 2016-04-11 ENCOUNTER — Other Ambulatory Visit (HOSPITAL_COMMUNITY)
Admission: RE | Admit: 2016-04-11 | Discharge: 2016-04-11 | Disposition: A | Discharge status: Home / Self Care | Payer: Medicaid Other | Source: Ambulatory Visit | Attending: Obstetrics and Gynecology | Admitting: Obstetrics and Gynecology

## 2016-04-11 ENCOUNTER — Encounter: Payer: Self-pay | Admitting: Obstetrics and Gynecology

## 2016-04-11 VITALS — BP 140/80 | HR 76 | Ht 68.0 in | Wt 356.0 lb

## 2016-04-11 DIAGNOSIS — Z1151 Encounter for screening for human papillomavirus (HPV): Secondary | ICD-10-CM | POA: Diagnosis not present

## 2016-04-11 DIAGNOSIS — Z01411 Encounter for gynecological examination (general) (routine) with abnormal findings: Secondary | ICD-10-CM | POA: Insufficient documentation

## 2016-04-11 DIAGNOSIS — Z124 Encounter for screening for malignant neoplasm of cervix: Secondary | ICD-10-CM | POA: Diagnosis not present

## 2016-04-11 DIAGNOSIS — N760 Acute vaginitis: Secondary | ICD-10-CM

## 2016-04-11 LAB — POCT URINALYSIS DIP (DEVICE)
Bilirubin Urine: NEGATIVE
Glucose, UA: NEGATIVE mg/dL
Nitrite: POSITIVE — AB
Protein, ur: NEGATIVE mg/dL
Specific Gravity, Urine: 1.025 (ref 1.005–1.030)
Urobilinogen, UA: 0.2 mg/dL (ref 0.0–1.0)
pH: 6.5 (ref 5.0–8.0)

## 2016-04-11 NOTE — Progress Notes (Signed)
50 yo G1P1 presenting today for the evaluation of a vaginal odor. Patient reports the odor being present for the past few weeks. She reports the presence of a non-pruritic discharge. She was recently treated for a UTI and BV. Patient is otherwise without complaints. She has not experienced a period thus far.   Past Medical History  Diagnosis Date  . Mental disorder   . Bipolar 1 disorder ([place])   . Bipolar 1 disorder ([place])   . Yeast infection of the vagina   . Bipolar disorder ([place])   . Obesity   . UTI (lower urinary tract infection)   . Alcohol abuse   . Shortness of breath dyspnea   . Anemia   . 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 Cholecystectomy    . Hysteroscopy w/d&c N/A 01/25/2016    Procedure: DILATATION AND CURETTAGE /HYSTEROSCOPY;  Surgeon: Mora Bellman, MD;  Location: [facility];  Service: Gynecology;  Laterality: N/A;   No family history on file. Social History  Substance Use Topics  . Smoking status: Never Smoker   . Smokeless tobacco: Never Used  . Alcohol Use: No   ROS See pertinent in HPI  Blood pressure 140/80, pulse 76, height 5\' 8"  (1.727 m), weight 356 lb (161.481 kg). GENERAL: Well-developed, well-nourished female in no acute distress.  ABDOMEN: Soft, nontender, nondistended. Obese PELVIC: Normal external female genitalia. Vagina is pink and rugated.  Normal discharge. Normal appearing cervix. Uterus is normal in size. No adnexal mass or tenderness. EXTREMITIES: No cyanosis, clubbing, or edema, 2+ distal pulses.   A/P  50 year old with vaginitis - Wet prep collected - Urine culture collected - Pap smear done today as she had not had a pap smear. Patient had a normal mammogram 10/2015 - Patient will be contacted with any abnormal results - RTC prn

## 2016-04-12 ENCOUNTER — Telehealth: Payer: Self-pay

## 2016-04-12 LAB — WET PREP, GENITAL
Trich, Wet Prep: NONE SEEN
Yeast Wet Prep HPF POC: NONE SEEN

## 2016-04-12 LAB — CYTOLOGY - PAP

## 2016-04-12 NOTE — Telephone Encounter (Signed)
Pt would like to know her urine results TODAY. Please call her!

## 2016-04-12 NOTE — Telephone Encounter (Signed)
Pt would like to know her results of her urine culture TODAY.

## 2016-04-14 LAB — URINE CULTURE: Colony Count: >=100000

## 2016-04-15 ENCOUNTER — Encounter: Payer: Self-pay | Admitting: Obstetrics and Gynecology

## 2016-04-15 ENCOUNTER — Telehealth: Payer: Self-pay

## 2016-04-15 DIAGNOSIS — R87613 High grade squamous intraepithelial lesion on cytologic smear of cervix (HGSIL): Secondary | ICD-10-CM | POA: Insufficient documentation

## 2016-04-15 MED ORDER — AMOXICILLIN-POT CLAVULANATE 875-125 MG PO TABS: 1.0000 | ORAL_TABLET | Freq: Two times a day (BID) | ORAL | Status: DC

## 2016-04-15 MED ORDER — METRONIDAZOLE 500 MG PO TABS: 500.0000 mg | ORAL_TABLET | Freq: Two times a day (BID) | ORAL | Status: DC

## 2016-04-15 NOTE — Addendum Note (Signed)
Addended by: Mora Bellman on: 04/15/2016 10:28 AM   Modules accepted: Orders

## 2016-04-15 NOTE — Telephone Encounter (Signed)
Called pt to inform her of her test results that we got back. Pt did not answer but I left a message stating that we have her results and that if she could call us back we will give her further information.

## 2016-04-16 NOTE — Telephone Encounter (Signed)
Pt has been informed of lab results. 

## 2016-04-22 ENCOUNTER — Ambulatory Visit: Payer: Self-pay | Admitting: Obstetrics and Gynecology

## 2016-04-27 ENCOUNTER — Emergency Department (HOSPITAL_COMMUNITY)
Admission: EM | Admit: 2016-04-27 | Discharge: 2016-04-27 | Disposition: A | Discharge status: Home / Self Care | Payer: Medicaid Other | Attending: Emergency Medicine | Admitting: Emergency Medicine

## 2016-04-27 ENCOUNTER — Encounter (HOSPITAL_COMMUNITY): Payer: Self-pay | Admitting: Emergency Medicine

## 2016-04-27 DIAGNOSIS — I10 Essential (primary) hypertension: Secondary | ICD-10-CM | POA: Diagnosis not present

## 2016-04-27 DIAGNOSIS — N390 Urinary tract infection, site not specified: Secondary | ICD-10-CM | POA: Diagnosis not present

## 2016-04-27 DIAGNOSIS — F319 Bipolar disorder, unspecified: Secondary | ICD-10-CM | POA: Insufficient documentation

## 2016-04-27 DIAGNOSIS — M549 Dorsalgia, unspecified: Secondary | ICD-10-CM | POA: Diagnosis present

## 2016-04-27 LAB — CBC WITH DIFFERENTIAL/PLATELET
Basophils Absolute: 0 10*3/uL (ref 0.0–0.1)
Basophils Relative: 0 %
Eosinophils Absolute: 0 10*3/uL (ref 0.0–0.7)
Eosinophils Relative: 0 %
HCT: 43.6 % (ref 36.0–46.0)
Hemoglobin: 14.7 g/dL (ref 12.0–15.0)
Lymphocytes Relative: 22 %
Lymphs Abs: 1.6 10*3/uL (ref 0.7–4.0)
MCH: 29.7 pg (ref 26.0–34.0)
MCHC: 33.7 g/dL (ref 30.0–36.0)
MCV: 88.1 fL (ref 78.0–100.0)
Monocytes Absolute: 0.8 10*3/uL (ref 0.1–1.0)
Monocytes Relative: 12 %
Neutro Abs: 4.7 10*3/uL (ref 1.7–7.7)
Neutrophils Relative %: 66 %
Platelets: 211 10*3/uL (ref 150–400)
RBC: 4.95 MIL/uL (ref 3.87–5.11)
RDW: 13.2 % (ref 11.5–15.5)
WBC: 7.1 10*3/uL (ref 4.0–10.5)

## 2016-04-27 LAB — URINALYSIS, ROUTINE W REFLEX MICROSCOPIC
Bilirubin Urine: NEGATIVE
Glucose, UA: NEGATIVE mg/dL
Ketones, ur: NEGATIVE mg/dL
Nitrite: POSITIVE — AB
Protein, ur: NEGATIVE mg/dL
Specific Gravity, Urine: 1.025 (ref 1.005–1.030)
pH: 6 (ref 5.0–8.0)

## 2016-04-27 LAB — URINE MICROSCOPIC-ADD ON

## 2016-04-27 LAB — BASIC METABOLIC PANEL
Anion gap: 6 (ref 5–15)
BUN: 13 mg/dL (ref 6–20)
CO2: 24 mmol/L (ref 22–32)
Calcium: 8.7 mg/dL — ABNORMAL LOW (ref 8.9–10.3)
Chloride: 105 mmol/L (ref 101–111)
Creatinine, Ser: 0.63 mg/dL (ref 0.44–1.00)
GFR calc Af Amer: >60 mL/min (ref >60)
GFR calc non Af Amer: >60 mL/min (ref >60)
Glucose, Bld: 111 mg/dL — ABNORMAL HIGH (ref 65–99)
Potassium: 4 mmol/L (ref 3.5–5.1)
Sodium: 135 mmol/L (ref 135–145)

## 2016-04-27 MED ORDER — NAPROXEN 375 MG PO TABS: 375.0000 mg | ORAL_TABLET | Freq: Two times a day (BID) | ORAL | Status: DC

## 2016-04-27 MED ORDER — CIPROFLOXACIN HCL 500 MG PO TABS: 500.0000 mg | ORAL_TABLET | Freq: Two times a day (BID) | ORAL | Status: DC

## 2016-04-27 MED ORDER — CYCLOBENZAPRINE HCL 5 MG PO TABS: 5.0000 mg | ORAL_TABLET | Freq: Three times a day (TID) | ORAL | Status: DC

## 2016-04-27 MED ORDER — CEFTRIAXONE SODIUM 1 G IJ SOLR
1.0000 g | Freq: Once | INTRAMUSCULAR | Status: AC
  Administered 2016-04-27: 1 g via INTRAMUSCULAR
  Filled 2016-04-27: qty 10

## 2016-04-27 NOTE — ED Notes (Addendum)
Pt from home via EMS with complaints of back pain x 2 months. Pt states that she has been seen for this recently. Pt is ambulatory per EMS. Pt states last time she was seen for this it was a UTI. Pt denies burning with urination, but states her urine is malodorous

## 2016-04-27 NOTE — ED Provider Notes (Signed)
CSN: [id number]     Arrival date & time 04/27/16  1655 History  By signing my name below, I, [facility] attest that this documentation has been prepared under the direction and in the presence of [name], PA-C. Electronically Signed: Virgel Bouquet, ED Scribe. 04/27/2016. 7:36 PM.   Chief Complaint  Patient presents with  . Back Pain    The history is provided by the patient. No language interpreter was used.   HPI Comments: Jordan [name] is a 50 y.o. female who presents to the Emergency Department complaining of constant, moderate, unchanging mid-back pain onset 2 months ago. Pt states that she was seen by her GYN for this same complaint 1 month ago where she was diagnosed with a UTI and prescribed antibiotics. She reports associated malodorous urine. She notes that she finished this prescription and took ibuprofen without relief. She notes similar symptoms in the past when the UTI spread to her bloodstream and she was admitted to the hospital. Denies recent sexual activity. Denies dysuria, fevers, nausea, and vomiting.   Past Medical History  Diagnosis Date  . Mental disorder   . Bipolar 1 disorder ([place])   . Bipolar 1 disorder ([place])   . Yeast infection of the vagina   . Bipolar disorder ([place])   . Obesity   . UTI (lower urinary tract infection)   . Alcohol abuse   . Shortness of breath dyspnea   . Anemia   . 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 Cholecystectomy    . Hysteroscopy w/d&c N/A 01/25/2016    Procedure: DILATATION AND CURETTAGE /HYSTEROSCOPY;  Surgeon: Mora Bellman, MD;  Location: [facility];  Service: Gynecology;  Laterality: N/A;   No family history on file. Social History  Substance Use Topics  . Smoking status: Never Smoker   . Smokeless tobacco: Never Used  . Alcohol Use: No   OB History    Gravida Para Term Preterm AB TAB SAB Ectopic Multiple Living   1 1 1       1       Review of Systems  Constitutional: Negative for fever.  Gastrointestinal: Negative for nausea and vomiting.  Genitourinary: Negative for dysuria.  Musculoskeletal: Positive for back pain.      Allergies  Citalopram; Lamotrigine; Sulfa antibiotics; and Tramadol  Home Medications   Prior to Admission medications   Medication Sig Start Date End Date Taking? Authorizing Provider  acetaminophen (TYLENOL) 500 MG tablet Take 1,000 mg by mouth every 6 (six) hours as needed for moderate pain. Reported on 04/11/2016    Historical Provider, MD  amoxicillin-clavulanate (AUGMENTIN) 875-125 MG tablet Take 1 tablet by mouth 2 (two) times daily. 04/15/16   Peggy Constant, MD  benzonatate (TESSALON) 100 MG capsule Take 1 capsule (100 mg total) by mouth every 8 (eight) hours. Patient not taking: Reported on 03/09/2016 03/05/16   Carlisle Cater, PA-C  ciprofloxacin (CIPRO) 500 MG tablet Take 1 tablet (500 mg total) by mouth 2 (two) times daily. 04/27/16   [name], PA-C  cyclobenzaprine (FLEXERIL) 5 MG tablet Take 1 tablet (5 mg total) by mouth 3 (three) times daily. 04/27/16   [name], PA-C  dicyclomine (BENTYL) 20 MG tablet Take 1 tablet (20 mg total) by mouth 2 (two) times daily. Patient not taking: Reported on 03/09/2016 03/05/16   Carlisle Cater, PA-C  fosfomycin (MONUROL) 3 g PACK Take 3 g by mouth once. Patient not taking: Reported  on 04/01/2016 03/27/16   Woodroe Mode, MD  loperamide (IMODIUM) 2 MG capsule Take 1 capsule (2 mg total) by mouth 4 (four) times daily as needed for diarrhea or loose stools. Patient not taking: Reported on 03/09/2016 03/05/16   Carlisle Cater, PA-C  metroNIDAZOLE (FLAGYL) 500 MG tablet Take 1 tablet (500 mg total) by mouth once. Patient not taking: Reported on 04/11/2016 04/01/16   Leonard Schwartz, MD  metroNIDAZOLE (FLAGYL) 500 MG tablet Take 1 tablet (500 mg total) by mouth 2 (two) times daily. 04/15/16   Peggy Constant, MD  naproxen (NAPROSYN) 375 MG tablet Take 1  tablet (375 mg total) by mouth 2 (two) times daily. 04/27/16   [name], PA-C  terbinafine (LAMISIL) 250 MG tablet Take 250 mg by mouth daily. 03/20/16   Historical Provider, MD   BP 130/97 mmHg  Pulse 91  Temp(Src) 98.3 F (36.8 C) (Oral)  Resp 18  SpO2 95%  LMP  (LMP Unknown) Physical Exam  Constitutional: She is oriented to person, place, and time. She appears well-developed and well-nourished. No distress.  HENT:  Head: Normocephalic and atraumatic.  Eyes: Conjunctivae are normal.  Neck: Normal range of motion.  Cardiovascular: Normal rate.   Pulmonary/Chest: Effort normal. No respiratory distress.  Abdominal: There is no CVA tenderness.  No CVA tenderness.  Musculoskeletal: Normal range of motion.  No muscular pain, paraspinal tenderness, or midline spinal tenderness.  Neurological: She is alert and oriented to person, place, and time.  Skin: Skin is warm and dry.  Psychiatric: She has a normal mood and affect. Her behavior is normal.  Nursing note and vitals reviewed.   ED Course  Procedures   DIAGNOSTIC STUDIES: Oxygen Saturation is 96% on RA, normal by my interpretation.    COORDINATION OF CARE: 6:37 PM Discussed results of urinalysis. Will order labs and Rocephin. Will prescribe Cipro, Flexeril, and Naprosyn. Discussed treatment plan with pt at bedside and pt agreed to plan.   Labs Review Labs Reviewed  URINALYSIS, ROUTINE W REFLEX MICROSCOPIC (NOT AT [facility]) - Abnormal; Notable for the following:    APPearance CLOUDY (*)    Hgb urine dipstick TRACE (*)    Nitrite POSITIVE (*)    Leukocytes, UA LARGE (*)    All other components within normal limits  URINE MICROSCOPIC-ADD ON - Abnormal; Notable for the following:    Squamous Epithelial / LPF 6-30 (*)    Bacteria, UA MANY (*)    All other components within normal limits  BASIC METABOLIC PANEL - Abnormal; Notable for the following:    Glucose, Bld 111 (*)    Calcium 8.7 (*)    All other components within  normal limits  URINE CULTURE  CBC WITH DIFFERENTIAL/PLATELET    I have personally reviewed and evaluated these lab results as part of my medical decision-making.    MDM   Final diagnoses:  UTI (lower urinary tract infection)  Essential hypertension    Pt diagnosed with a UTI. Pt is afebrile, tachycardia, hypotension, or other signs of serious infection.  Pt to be dc home with antibiotics and instructions to follow up with PCP if symptoms persist. Discussed return precautions. Pt appears safe for discharge.  I personally performed the services described in this documentation, which was scribed in my presence. The recorded information has been reviewed and is accurate.       [name], PA-C 04/27/16 2130  Davonna Belling, MD 04/27/16 [phone]

## 2016-04-27 NOTE — Discharge Instructions (Signed)

## 2016-05-01 LAB — URINE CULTURE: Culture: >=100000 — AB

## 2016-05-02 ENCOUNTER — Telehealth: Payer: Self-pay | Admitting: General Practice

## 2016-05-02 ENCOUNTER — Encounter (HOSPITAL_COMMUNITY): Payer: Self-pay

## 2016-05-02 ENCOUNTER — Telehealth (HOSPITAL_COMMUNITY): Payer: Self-pay

## 2016-05-02 ENCOUNTER — Inpatient Hospital Stay (HOSPITAL_COMMUNITY)
Admission: AD | Admit: 2016-05-02 | Discharge: 2016-05-02 | Disposition: A | Discharge status: Home / Self Care | Payer: Medicaid Other | Source: Ambulatory Visit | Attending: Obstetrics & Gynecology | Admitting: Obstetrics & Gynecology

## 2016-05-02 DIAGNOSIS — B9620 Unspecified Escherichia coli [E. coli] as the cause of diseases classified elsewhere: Secondary | ICD-10-CM | POA: Diagnosis not present

## 2016-05-02 DIAGNOSIS — R0602 Shortness of breath: Secondary | ICD-10-CM | POA: Insufficient documentation

## 2016-05-02 DIAGNOSIS — N390 Urinary tract infection, site not specified: Secondary | ICD-10-CM | POA: Diagnosis not present

## 2016-05-02 DIAGNOSIS — E669 Obesity, unspecified: Secondary | ICD-10-CM | POA: Diagnosis not present

## 2016-05-02 DIAGNOSIS — D649 Anemia, unspecified: Secondary | ICD-10-CM | POA: Insufficient documentation

## 2016-05-02 DIAGNOSIS — F1010 Alcohol abuse, uncomplicated: Secondary | ICD-10-CM | POA: Diagnosis not present

## 2016-05-02 DIAGNOSIS — M545 Low back pain, unspecified: Secondary | ICD-10-CM

## 2016-05-02 DIAGNOSIS — B373 Candidiasis of vulva and vagina: Secondary | ICD-10-CM | POA: Diagnosis not present

## 2016-05-02 DIAGNOSIS — F319 Bipolar disorder, unspecified: Secondary | ICD-10-CM | POA: Insufficient documentation

## 2016-05-02 LAB — URINALYSIS, ROUTINE W REFLEX MICROSCOPIC
Bilirubin Urine: NEGATIVE
Glucose, UA: NEGATIVE mg/dL
Hgb urine dipstick: NEGATIVE
Ketones, ur: NEGATIVE mg/dL
Nitrite: NEGATIVE
Protein, ur: NEGATIVE mg/dL
Specific Gravity, Urine: 1.02 (ref 1.005–1.030)
pH: 5.5 (ref 5.0–8.0)

## 2016-05-02 LAB — URINE MICROSCOPIC-ADD ON

## 2016-05-02 LAB — POCT PREGNANCY, URINE: Preg Test, Ur: NEGATIVE

## 2016-05-02 MED ORDER — KETOROLAC TROMETHAMINE 60 MG/2ML IM SOLN
60.0000 mg | Freq: Once | INTRAMUSCULAR | Status: AC
  Administered 2016-05-02: 60 mg via INTRAMUSCULAR
  Filled 2016-05-02: qty 2

## 2016-05-02 NOTE — Telephone Encounter (Signed)
Post ED Visit - Positive Culture Follow-up: Successful Patient Follow-Up  Culture assessed and recommendations reviewed by: []  Elenor Quinones, Pharm.D. []  Heide Guile, Pharm.D., BCPS []  Parks Neptune, Pharm.D. [x]  Alycia Rossetti, Pharm.D., BCPS []  [place], [place].D., BCPS, AAHIVP []  Legrand Como, Pharm.D., BCPS, AAHIVP []  Milus Glazier, Pharm.D. []  Stephens November, Pharm.D.  Positive urine culture  []  Patient discharged without antimicrobial prescription and treatment is now indicated [x]  Organism is resistant to prescribed ED discharge antimicrobial []  Patient with positive blood cultures  Changes discussed with ED provider: chris lawyer New antibiotic prescription fosfomycin 3 grams every 48 hours x 3. Once completed follow up with OBGYN for repeat urine culture  No answer. Letter sent to address on record.   Ileene Musa 05/02/2016, 10:00 AM

## 2016-05-02 NOTE — Discharge Instructions (Signed)

## 2016-05-02 NOTE — MAU Note (Signed)
Pt reports she has had back pain for several months. Was treates for a UTI 2-3 weeks ago and given pain medication and antibitics. Pain medicine is not working for her back pain

## 2016-05-02 NOTE — MAU Provider Note (Signed)
History     CSN: [id number]  Arrival date and time: 05/02/16 1752   First Provider Initiated Contact with Patient 05/02/16 2050      Chief Complaint  Patient presents with  . Back Pain   HPI Comments: Jordan [name] is a 50 y.o. G1P1001 who presents today with back pain. She was seen in the ED on 7/8 and started on abx for UTI. She was called today to change the abx because the strain of E. Coli was not sensitive to what she was started on. She did not get the message and has not been to pick up the new rx. She states that tylenol helps her pain better than anything else she has been given for her pain.   Back Pain This is a new problem. The current episode started 1 to 4 weeks ago. The problem occurs constantly. The problem is unchanged. The pain is present in the lumbar spine. The quality of the pain is described as aching. The pain does not radiate. The pain is at a severity of 10/10. Pertinent negatives include no abdominal pain, dysuria or fever. Risk factors include obesity. She has tried analgesics for the symptoms.    Past Medical History  Diagnosis Date  . Mental disorder   . Bipolar 1 disorder ([place])   . Bipolar 1 disorder ([place])   . Yeast infection of the vagina   . Bipolar disorder ([place])   . Obesity   . UTI (lower urinary tract infection)   . Alcohol abuse   . Shortness of breath dyspnea   . Anemia   . 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 Cholecystectomy    . Hysteroscopy w/d&c N/A 01/25/2016    Procedure: DILATATION AND CURETTAGE /HYSTEROSCOPY;  Surgeon: Mora Bellman, MD;  Location: [facility];  Service: Gynecology;  Laterality: N/A;    No family history on file.  Social History  Substance Use Topics  . Smoking status: Never Smoker   . Smokeless tobacco: Never Used  . Alcohol Use: No    Allergies:  Allergies  Allergen Reactions  . Citalopram Other (See Comments)    Possible cause  of pancytopenia.  . Lamotrigine Other (See Comments)    Possible cause of pancytopenia.  . Sulfa Antibiotics Anaphylaxis  . Tramadol Anaphylaxis, Shortness Of Breath and Swelling    Prescriptions prior to admission  Medication Sig Dispense Refill Last Dose  . acetaminophen (TYLENOL) 500 MG tablet Take 1,000 mg by mouth every 6 (six) hours as needed for moderate pain. Reported on 04/11/2016   Not Taking  . amoxicillin-clavulanate (AUGMENTIN) 875-125 MG tablet Take 1 tablet by mouth 2 (two) times daily. 14 tablet 0   . benzonatate (TESSALON) 100 MG capsule Take 1 capsule (100 mg total) by mouth every 8 (eight) hours. (Patient not taking: Reported on 03/09/2016) 15 capsule 0 Not Taking  . ciprofloxacin (CIPRO) 500 MG tablet Take 1 tablet (500 mg total) by mouth 2 (two) times daily. 14 tablet 0   . cyclobenzaprine (FLEXERIL) 5 MG tablet Take 1 tablet (5 mg total) by mouth 3 (three) times daily. 20 tablet 0   . dicyclomine (BENTYL) 20 MG tablet Take 1 tablet (20 mg total) by mouth 2 (two) times daily. (Patient not taking: Reported on 03/09/2016) 20 tablet 0 Not Taking  . fosfomycin (MONUROL) 3 g PACK Take 3 g by mouth once. (Patient not taking: Reported on 04/01/2016) 1 g  0 Not Taking  . loperamide (IMODIUM) 2 MG capsule Take 1 capsule (2 mg total) by mouth 4 (four) times daily as needed for diarrhea or loose stools. (Patient not taking: Reported on 03/09/2016) 12 capsule 0 Not Taking  . metroNIDAZOLE (FLAGYL) 500 MG tablet Take 1 tablet (500 mg total) by mouth once. (Patient not taking: Reported on 04/11/2016) 14 tablet 0 Not Taking  . metroNIDAZOLE (FLAGYL) 500 MG tablet Take 1 tablet (500 mg total) by mouth 2 (two) times daily. 14 tablet 0   . naproxen (NAPROSYN) 375 MG tablet Take 1 tablet (375 mg total) by mouth 2 (two) times daily. 20 tablet 0   . terbinafine (LAMISIL) 250 MG tablet Take 250 mg by mouth daily.  1 Taking    Review of Systems  Constitutional: Negative for fever and chills.   Gastrointestinal: Negative for nausea, vomiting, abdominal pain, diarrhea and constipation.  Genitourinary: Negative for dysuria, urgency and frequency.  Musculoskeletal: Positive for back pain.   Physical Exam   Blood pressure 122/59, pulse 90, temperature 98.7 F (37.1 C), resp. rate 18.  Physical Exam  Nursing note and vitals reviewed. Constitutional: She is oriented to person, place, and time. She appears well-developed and well-nourished. No distress.  HENT:  Head: Normocephalic.  Cardiovascular: Normal rate.   Respiratory: Effort normal.  GI: Soft. There is no tenderness. There is no rebound.  Genitourinary:  No CVA tenderness.   Neurological: She is alert and oriented to person, place, and time.  Skin: Skin is warm and dry.  Psychiatric: She has a normal mood and affect.    MAU Course  Procedures  MDM Patient has had toradol for pain she reports that her pain is better at this time.   Assessment and Plan   1. UTI (lower urinary tract infection)   2. Bilateral low back pain without sciatica    DC home Comfort measures reviewed  RX: none, pick up new antibiotic that was sent to pharmacy.    Follow-up Information    Follow up with [facility].   Why:  If symptoms worsen   Contact information:   [place] [address] [phone]         Mathis Bud 05/02/2016, 8:52 PM

## 2016-05-02 NOTE — Telephone Encounter (Signed)
Patient called and left message stating she is returning our call. Patient states her back is really hurting. Per chart review, patient needs colpo for abnormal pap appt is 8/4. Called patient & phone went to voicemail. Left message to call us back as we are trying to return your phone & to let you know about an upcoming appt.

## 2016-05-07 NOTE — Telephone Encounter (Signed)
Patient has been informed of colposcopy and appointment.

## 2016-05-08 ENCOUNTER — Telehealth: Payer: Self-pay | Admitting: *Deleted

## 2016-05-08 NOTE — Telephone Encounter (Signed)
Tayva called and left another message this am stating she wants [name] to explain again to her about what she is getting done next month.   I called Tayzia and explained to her what colposcopy is and why she needs it. She also asked me to explain to her boyfriend which I did. She voices understanding.

## 2016-05-08 NOTE — Telephone Encounter (Signed)
Pt left message stating that a nurse had called her and explained "what they would be doing on the 4th."  Pt requested a call back with repeat explanation because she does not understand.

## 2016-05-09 ENCOUNTER — Emergency Department (HOSPITAL_COMMUNITY): Payer: Medicaid Other

## 2016-05-09 ENCOUNTER — Encounter (HOSPITAL_COMMUNITY): Payer: Self-pay

## 2016-05-09 ENCOUNTER — Emergency Department (HOSPITAL_COMMUNITY)
Admission: EM | Admit: 2016-05-09 | Discharge: 2016-05-09 | Disposition: A | Discharge status: Home / Self Care | Payer: Medicaid Other | Attending: Emergency Medicine | Admitting: Emergency Medicine

## 2016-05-09 DIAGNOSIS — M545 Low back pain, unspecified: Secondary | ICD-10-CM

## 2016-05-09 DIAGNOSIS — Z792 Long term (current) use of antibiotics: Secondary | ICD-10-CM | POA: Diagnosis not present

## 2016-05-09 DIAGNOSIS — M4306 Spondylolysis, lumbar region: Secondary | ICD-10-CM | POA: Insufficient documentation

## 2016-05-09 DIAGNOSIS — F319 Bipolar disorder, unspecified: Secondary | ICD-10-CM | POA: Diagnosis not present

## 2016-05-09 DIAGNOSIS — M47816 Spondylosis without myelopathy or radiculopathy, lumbar region: Secondary | ICD-10-CM

## 2016-05-09 DIAGNOSIS — Z79899 Other long term (current) drug therapy: Secondary | ICD-10-CM | POA: Insufficient documentation

## 2016-05-09 LAB — URINALYSIS, ROUTINE W REFLEX MICROSCOPIC
Bilirubin Urine: NEGATIVE
Glucose, UA: NEGATIVE mg/dL
Ketones, ur: NEGATIVE mg/dL
Nitrite: NEGATIVE
Protein, ur: NEGATIVE mg/dL
Specific Gravity, Urine: 1.034 — ABNORMAL HIGH (ref 1.005–1.030)
pH: 6 (ref 5.0–8.0)

## 2016-05-09 LAB — URINE MICROSCOPIC-ADD ON

## 2016-05-09 MED ORDER — NAPROXEN 500 MG PO TABS: 500.0000 mg | ORAL_TABLET | Freq: Two times a day (BID) | ORAL | Status: DC

## 2016-05-09 MED ORDER — CYCLOBENZAPRINE HCL 10 MG PO TABS: 10.0000 mg | ORAL_TABLET | Freq: Two times a day (BID) | ORAL | Status: DC | PRN

## 2016-05-09 NOTE — ED Provider Notes (Signed)
CSN: [id number]     Arrival date & time 05/09/16  1859 History  By signing my name below, I, Jordan [name], attest that this documentation has been prepared under the direction and in the presence of [name], PA-C. Electronically Signed: Georgette [name], ED Scribe. 05/09/2016. 8:11 PM.   Chief Complaint  Patient presents with  . Back Pain   The history is provided by the patient. No language interpreter was used.    HPI Comments: Jordan [name] is a 50 y.o. female with h/o obesity who presents to the Emergency Department by EMS complaining of sudden onset, constant, moderate, unchanging back pain onset three months ago. Pt woke up this morning with worsening pain. Pt was seen in the ED on 04/27/16 and again at [facility] on 05/02/16 and presented with a UTI and discharged with an antibiotic with no relief. Pt still presents with malodorous urine just as she did in the past two visits but states that she isn't here for it and "just here for her back pain". She denies any injury. Pt denies urinary and bowel incontinence, paresthesia and numbness.   Past Medical History  Diagnosis Date  . Mental disorder   . Bipolar 1 disorder ([place])   . Bipolar 1 disorder ([place])   . Yeast infection of the vagina   . Bipolar disorder ([place])   . Obesity   . UTI (lower urinary tract infection)   . Alcohol abuse   . Shortness of breath dyspnea   . Anemia   . 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 Cholecystectomy    . Hysteroscopy w/d&c N/A 01/25/2016    Procedure: DILATATION AND CURETTAGE /HYSTEROSCOPY;  Surgeon: Mora Bellman, MD;  Location: [facility];  Service: Gynecology;  Laterality: N/A;   No family history on file. Social History  Substance Use Topics  . Smoking status: Never Smoker   . Smokeless tobacco: Never Used  . Alcohol Use: No   OB History    Gravida Para Term Preterm AB TAB SAB Ectopic Multiple Living   1 1 1       1      Review  of Systems  Constitutional: Negative for fever.  Genitourinary: Negative for dysuria.  Musculoskeletal: Positive for back pain.  Neurological: Negative for numbness.      Allergies  Citalopram; Lamotrigine; Sulfa antibiotics; and Tramadol  Home Medications   Prior to Admission medications   Medication Sig Start Date End Date Taking? Authorizing Provider  acetaminophen (TYLENOL) 500 MG tablet Take 1,000 mg by mouth every 6 (six) hours as needed for moderate pain. Reported on 04/11/2016    Historical Provider, MD  amoxicillin-clavulanate (AUGMENTIN) 875-125 MG tablet Take 1 tablet by mouth 2 (two) times daily. 04/15/16   Peggy Constant, MD  benzonatate (TESSALON) 100 MG capsule Take 1 capsule (100 mg total) by mouth every 8 (eight) hours. Patient not taking: Reported on 03/09/2016 03/05/16   Carlisle Cater, PA-C  ciprofloxacin (CIPRO) 500 MG tablet Take 1 tablet (500 mg total) by mouth 2 (two) times daily. 04/27/16   [name], PA-C  cyclobenzaprine (FLEXERIL) 5 MG tablet Take 1 tablet (5 mg total) by mouth 3 (three) times daily. 04/27/16   [name], PA-C  dicyclomine (BENTYL) 20 MG tablet Take 1 tablet (20 mg total) by mouth 2 (two) times daily. Patient not taking: Reported on 03/09/2016 03/05/16   Carlisle Cater, PA-C  fosfomycin (MONUROL) 3 g PACK Take  3 g by mouth once. Patient not taking: Reported on 04/01/2016 03/27/16   Woodroe Mode, MD  loperamide (IMODIUM) 2 MG capsule Take 1 capsule (2 mg total) by mouth 4 (four) times daily as needed for diarrhea or loose stools. Patient not taking: Reported on 03/09/2016 03/05/16   Carlisle Cater, PA-C  metroNIDAZOLE (FLAGYL) 500 MG tablet Take 1 tablet (500 mg total) by mouth once. Patient not taking: Reported on 04/11/2016 04/01/16   Leonard Schwartz, MD  metroNIDAZOLE (FLAGYL) 500 MG tablet Take 1 tablet (500 mg total) by mouth 2 (two) times daily. 04/15/16   Peggy Constant, MD  naproxen (NAPROSYN) 375 MG tablet Take 1 tablet (375 mg total) by mouth  2 (two) times daily. 04/27/16   [name], PA-C  terbinafine (LAMISIL) 250 MG tablet Take 250 mg by mouth daily. 03/20/16   Historical Provider, MD   BP 139/97 mmHg  Pulse 103  Temp(Src) 98.6 F (37 C) (Oral)  Resp 21  SpO2 95%  LMP  (LMP Unknown) Physical Exam  Constitutional: She appears well-developed and well-nourished.  HENT:  Head: Normocephalic.  Eyes: Conjunctivae are normal.  Cardiovascular: Normal rate.   Pulmonary/Chest: Effort normal. No respiratory distress.  Abdominal: She exhibits no distension.  Musculoskeletal: Normal range of motion. She exhibits tenderness.  Full ROM. Tender to palpation on lumbar paraspinal muscles. No weakness. Normal reflexes.  Neurological: She is alert.  Skin: Skin is warm and dry.  Psychiatric: She has a normal mood and affect. Her behavior is normal.  Nursing note and vitals reviewed.   ED Course  Procedures  DIAGNOSTIC STUDIES: Oxygen Saturation is 95% on RA, adequate by my interpretation.    COORDINATION OF CARE: 8:10 PM Discussed treatment plan with pt at bedside which includes x-ray and pt agreed to plan.  Labs Review Labs Reviewed  URINALYSIS, ROUTINE W REFLEX MICROSCOPIC (NOT AT [facility]) - Abnormal; Notable for the following:    APPearance CLOUDY (*)    Specific Gravity, Urine 1.034 (*)    Hgb urine dipstick TRACE (*)    Leukocytes, UA SMALL (*)    All other components within normal limits  URINE MICROSCOPIC-ADD ON - Abnormal; Notable for the following:    Squamous Epithelial / LPF 0-5 (*)    Bacteria, UA FEW (*)    All other components within normal limits  URINE CULTURE    Imaging Review Dg Lumbar Spine Complete  05/09/2016  CLINICAL DATA:  Progressive low back pain for 3 months. No known injury. EXAM: LUMBAR SPINE - COMPLETE 4+ VIEW COMPARISON:  CT on 02/01/2016 FINDINGS: There is no evidence of lumbar spine fracture. Alignment is normal. Mild to moderate degenerative disc disease and facet DJD again seen  bilaterally at L5-S1. Other intervertebral disc spaces are maintained. No other bone lesions identified. IMPRESSION: No acute findings.  Degenerative spondylosis, as described above. Electronically Signed   By: Earle Gell M.D.   On: 05/09/2016 20:40   I have personally reviewed and evaluated these images and lab results as part of my medical decision-making.   EKG Interpretation None      MDM   Final diagnoses:  Lumbar spondylosis, unspecified spinal osteoarthritis  Bilateral low back pain without sciatica    UA equivocal- sent for culture Patient with back pain.  No neurological deficits and normal neuro exam.  Patient can walk but states is painful.  No loss of bowel or bladder control.  No concern for cauda equina.  No fever, night sweats, weight loss, h/o  cancer, IVDU.  RICE protocol and pain medicine indicated and discussed with patient.     I personally performed the services described in this documentation, which was scribed in my presence. The recorded information has been reviewed and is accurate.      [name], PA-C 05/09/16 2211   Lacretia Leigh, MD 05/13/16 [phone]

## 2016-05-09 NOTE — ED Notes (Signed)
Pt presents via EMS with c/o back pain for the past 3 months. Pt reports she woke up with the pain, no trauma. Pt was seen for same recently and discharged with a muscle relaxer with no relief. Ambulatory on scene.

## 2016-05-09 NOTE — ED Notes (Signed)
PT DISCHARGED. INSTRUCTIONS AND PRESCRIPTIONS GIVEN. AAOX4. PT IN NO APPARENT DISTRESS. THE OPPORTUNITY TO ASK QUESTIONS WAS PROVIDED. 

## 2016-05-09 NOTE — Discharge Instructions (Signed)
Your xray shows some mild arthritis.  Your urine has been sent for culture, but there is no sign of infection at this point. Please see your primary care doctor for higher level pain control. I have increased your medication levels to a higher dose. Practice some of the exercises below to help develop a stronger core and alleviate back pain.  Back Pain, Adult Back pain is very common in adults.The cause of back pain is rarely dangerous and the pain often gets better over time.The cause of your back pain may not be known. Some common causes of back pain include: 1. Strain of the muscles or ligaments supporting the spine. 2. Wear and tear (degeneration) of the spinal disks. 3. Arthritis. 4. Direct injury to the back. For many people, back pain may return. Since back pain is rarely dangerous, most people can learn to manage this condition on their own. HOME CARE INSTRUCTIONS Watch your back pain for any changes. The following actions may help to lessen any discomfort you are feeling: 1. Remain active. It is stressful on your back to sit or stand in one place for long periods of time. Do not sit, drive, or stand in one place for more than 30 minutes at a time. Take short walks on even surfaces as soon as you are able.Try to increase the length of time you walk each day. 2. Exercise regularly as directed by your health care provider. Exercise helps your back heal faster. It also helps avoid future injury by keeping your muscles strong and flexible. 3. Do not stay in bed.Resting more than 1-2 days can delay your recovery. 4. Pay attention to your body when you bend and lift. The most comfortable positions are those that put less stress on your recovering back. Always use proper lifting techniques, including: 1. Bending your knees. 2. Keeping the load close to your body. 3. Avoiding twisting. 5. Find a comfortable position to sleep. Use a firm mattress and lie on your side with your knees slightly  bent. If you lie on your back, put a pillow under your knees. 6. Avoid feeling anxious or stressed.Stress increases muscle tension and can worsen back pain.It is important to recognize when you are anxious or stressed and learn ways to manage it, such as with exercise. 7. Take medicines only as directed by your health care provider. Over-the-counter medicines to reduce pain and inflammation are often the most helpful.Your health care provider may prescribe muscle relaxant drugs.These medicines help dull your pain so you can more quickly return to your normal activities and healthy exercise. 8. Apply ice to the injured area: 1. Put ice in a plastic bag. 2. Place a towel between your skin and the bag. 3. Leave the ice on for 20 minutes, 2-3 times a day for the first 2-3 days. After that, ice and heat may be alternated to reduce pain and spasms. 9. Maintain a healthy weight. Excess weight puts extra stress on your back and makes it difficult to maintain good posture. SEEK MEDICAL CARE IF: 1. You have pain that is not relieved with rest or medicine. 2. You have increasing pain going down into the legs or buttocks. 3. You have pain that does not improve in one week. 4. You have night pain. 5. You lose weight. 6. You have a fever or chills. SEEK IMMEDIATE MEDICAL CARE IF:  1. You develop new bowel or bladder control problems. 2. You have unusual weakness or numbness in your arms or legs. 3.  You develop nausea or vomiting. 4. You develop abdominal pain. 5. You feel faint.   This information is not intended to replace advice given to you by your health care provider. Make sure you discuss any questions you have with your health care provider.   Document Released: 10/07/2005 Document Revised: 10/28/2014 Document Reviewed: 02/08/2014 Elsevier Interactive Patient Education 2016 [employer] Back Exercises The following exercises strengthen the muscles that help to support the back. They also  help to keep the lower back flexible. Doing these exercises can help to prevent back pain or lessen existing pain. If you have back pain or discomfort, try doing these exercises 2-3 times each day or as told by your health care provider. When the pain goes away, do them once each day, but increase the number of times that you repeat the steps for each exercise (do more repetitions). If you do not have back pain or discomfort, do these exercises once each day or as told by your health care provider. EXERCISES Single Knee to Chest Repeat these steps 3-5 times for each leg: 5. Lie on your back on a firm bed or the floor with your legs extended. 6. Bring one knee to your chest. Your other leg should stay extended and in contact with the floor. 7. Hold your knee in place by grabbing your knee or thigh. 8. Pull on your knee until you feel a gentle stretch in your lower back. 9. Hold the stretch for 10-30 seconds. 10. Slowly release and straighten your leg. Pelvic Tilt Repeat these steps 5-10 times: 10. Lie on your back on a firm bed or the floor with your legs extended. Tucker your knees so they are pointing toward the ceiling and your feet are flat on the floor. 12. Tighten your lower abdominal muscles to press your lower back against the floor. This motion will tilt your pelvis so your tailbone points up toward the ceiling instead of pointing to your feet or the floor. 13. With gentle tension and even breathing, hold this position for 5-10 seconds. Cat-Cow Repeat these steps until your lower back becomes more flexible: 7. Get into a hands-and-knees position on a firm surface. Keep your hands under your shoulders, and keep your knees under your hips. You may place padding under your knees for comfort. 8. Let your head hang down, and point your tailbone toward the floor so your lower back becomes rounded like the back of a cat. 9. Hold this position for 5 seconds. 10. Slowly lift your head and  point your tailbone up toward the ceiling so your back forms a sagging arch like the back of a cow. 11. Hold this position for 5 seconds. Press-Ups Repeat these steps 5-10 times: 6. Lie on your abdomen (face-down) on the floor. 7. Place your palms near your head, about shoulder-width apart. 8. While you keep your back as relaxed as possible and keep your hips on the floor, slowly straighten your arms to raise the top half of your body and lift your shoulders. Do not use your back muscles to raise your upper torso. You may adjust the placement of your hands to make yourself more comfortable. 9. Hold this position for 5 seconds while you keep your back relaxed. 10. Slowly return to lying flat on the floor. Bridges Repeat these steps 10 times: 1. Lie on your back on a firm surface. 2. Bend your knees so they are pointing toward the ceiling and your feet are flat on the  floor. 3. Tighten your buttocks muscles and lift your buttocks off of the floor until your waist is at almost the same height as your knees. You should feel the muscles working in your buttocks and the back of your thighs. If you do not feel these muscles, slide your feet 1-2 inches farther away from your buttocks. 4. Hold this position for 3-5 seconds. 5. Slowly lower your hips to the starting position, and allow your buttocks muscles to relax completely. If this exercise is too easy, try doing it with your arms crossed over your chest. Abdominal Crunches Repeat these steps 5-10 times: 1. Lie on your back on a firm bed or the floor with your legs extended. 2. Bend your knees so they are pointing toward the ceiling and your feet are flat on the floor. 3. Cross your arms over your chest. 4. Tip your chin slightly toward your chest without bending your neck. 5. Tighten your abdominal muscles and slowly raise your trunk (torso) high enough to lift your shoulder blades a tiny bit off of the floor. Avoid raising your torso higher than  that, because it can put too much stress on your low back and it does not help to strengthen your abdominal muscles. 6. Slowly return to your starting position. Back Lifts Repeat these steps 5-10 times: 1. Lie on your abdomen (face-down) with your arms at your sides, and rest your forehead on the floor. 2. Tighten the muscles in your legs and your buttocks. 3. Slowly lift your chest off of the floor while you keep your hips pressed to the floor. Keep the back of your head in line with the curve in your back. Your eyes should be looking at the floor. 4. Hold this position for 3-5 seconds. 5. Slowly return to your starting position. SEEK MEDICAL CARE IF:  Your back pain or discomfort gets much worse when you do an exercise.  Your back pain or discomfort does not lessen within 2 hours after you exercise. If you have any of these problems, stop doing these exercises right away. Do not do them again unless your health care provider says that you can. SEEK IMMEDIATE MEDICAL CARE IF:  You develop sudden, severe back pain. If this happens, stop doing the exercises right away. Do not do them again unless your health care provider says that you can.   This information is not intended to replace advice given to you by your health care provider. Make sure you discuss any questions you have with your health care provider.   Document Released: 11/14/2004 Document Revised: 06/28/2015 Document Reviewed: 12/01/2014 Elsevier Interactive Patient Education [employer].

## 2016-05-11 LAB — URINE CULTURE

## 2016-05-24 ENCOUNTER — Encounter: Payer: Self-pay | Admitting: Obstetrics & Gynecology

## 2016-05-24 ENCOUNTER — Ambulatory Visit (INDEPENDENT_AMBULATORY_CARE_PROVIDER_SITE_OTHER): Payer: Medicaid Other | Admitting: Obstetrics & Gynecology

## 2016-05-24 ENCOUNTER — Ambulatory Visit (INDEPENDENT_AMBULATORY_CARE_PROVIDER_SITE_OTHER): Payer: Medicaid Other | Admitting: Clinical

## 2016-05-24 VITALS — BP 132/108 | HR 109

## 2016-05-24 DIAGNOSIS — F411 Generalized anxiety disorder: Secondary | ICD-10-CM

## 2016-05-24 DIAGNOSIS — R300 Dysuria: Secondary | ICD-10-CM | POA: Diagnosis present

## 2016-05-24 DIAGNOSIS — R87613 High grade squamous intraepithelial lesion on cytologic smear of cervix (HGSIL): Secondary | ICD-10-CM

## 2016-05-24 LAB — POCT PREGNANCY, URINE: Preg Test, Ur: NEGATIVE

## 2016-05-24 NOTE — Progress Notes (Signed)
  ASSESSMENT: Pt currently experiencing Generalized anxiety disorder. Pt needs to f/u with PCP and [facility]. Pt would benefit from brief therapeutic interventions. Stage of Change: precontemplative  PLAN: 1. F/U with behavioral health clinician as needed 2. Psychiatric Medications: none 3. Behavioral recommendations:   -Consider f/u with [facility] after finding out results of surgery  SUBJECTIVE: Pt. referred by Clovia Cuff, MD,  for symptoms of depression and anxiety Pt. reports the following symptoms/concerns: Pt states that her primary concern today is feeling irritated and anxious over uncertainty about upcoming surgery. Pt is scared that she may have cancer, and came in today to talk it out before finding out the results; talking aloud with another female helps to cope with the anxiety. Duration of problem: GAD for over one year, increase in anxiety, one day Severity: severe   OBJECTIVE: Orientation & Cognition: Oriented x3. Thought processes normal and appropriate to situation. Mood: irritable Affect: appropriate Appearance: appropriate Risk of harm to self or others: no known risk of harm to self or others, no SI, simply scared of possible cancer, as she associates cancer with death Substance use: "absolutely not" Assessments administered: PHQ9: 22/ GAD7: 13  Diagnosis: Generalized anxiety disorder CPT Code: F41.1  -------------------------------------------- Other(s) present in the room:  Common-law husband for first 5 minutes only  Time spent with patient in exam room: 60 minutes 10:30-11:30am  Depression screen PHQ 2/9 05/24/2016  Decreased Interest 0  Down, Depressed, Hopeless 3  PHQ - 2 Score 3  Altered sleeping 2  Tired, decreased energy 3  Change in appetite 3  Feeling bad or failure about yourself  3  Trouble concentrating 3  Moving slowly or fidgety/restless 2  Suicidal thoughts 3  PHQ-9 Score 22   GAD 7 : Generalized Anxiety Score 05/24/2016  Nervous, Anxious, on Edge 0   Control/stop worrying 2  Worry too much - different things 2  Trouble relaxing 3  Restless 0  Easily annoyed or irritable 3  Afraid - awful might happen 3  Total GAD 7 Score 13

## 2016-05-24 NOTE — Progress Notes (Signed)
   Subjective:    Patient ID: Jordan [name], female    DOB: September 11, 1966, 50 y.o.   MRN: [id number]  HPI 50 yo DW P1 (14 yo daughter in [place]) here for a colpo due to a HGSIL pap last month. In 2013  she had a LGSIL pap and a colpo was done with a negative ECC. She did not get any pap smears in the interim.   Review of Systems     Objective:   Physical Exam  Morbidly obese WF, anxious and somewhat fixated on worries about death from cervical cancer Her colpo was inadequate as there was an acetowhite lesion that was in the cervical os and extending upwards to the point that I could not see the entire lesion.      Assessment & Plan:  Inadequate colpo with HGSIL pap I sent [name] an email to schedule a CKC. She has requested a UC&S so this was sent

## 2016-05-26 LAB — URINE CULTURE
Colony Count: >=100000
Organism ID, Bacteria: 10000

## 2016-05-27 ENCOUNTER — Emergency Department (HOSPITAL_COMMUNITY)
Admission: EM | Admit: 2016-05-27 | Discharge: 2016-05-27 | Disposition: A | Discharge status: Home / Self Care | Payer: Medicaid Other | Attending: Emergency Medicine | Admitting: Emergency Medicine

## 2016-05-27 ENCOUNTER — Encounter (HOSPITAL_COMMUNITY): Payer: Self-pay | Admitting: Emergency Medicine

## 2016-05-27 DIAGNOSIS — M545 Low back pain, unspecified: Secondary | ICD-10-CM

## 2016-05-27 DIAGNOSIS — G8929 Other chronic pain: Secondary | ICD-10-CM | POA: Insufficient documentation

## 2016-05-27 NOTE — ED Triage Notes (Signed)
Pt in from home with c/o sharp generalized back pain x 41mo's. Pt denies pain with urination, denies n/v/d. Pt states she's been taking Tylenol and OTC meds with no pain relief.

## 2016-05-27 NOTE — ED Provider Notes (Signed)
[facility] DEPT Provider Note   CSN: [id number] Arrival date & time: 05/27/16  0802  First Provider Contact:  None       History   Chief Complaint Chief Complaint  Patient presents with  . Back Pain    HPI Jordan [name] is a 50 y.o. female.   Back Pain   This is a chronic problem. The current episode started more than 1 week ago. The problem occurs constantly. Progression since onset: waxes and wanes. The pain is associated with no known injury. The pain is present in the lumbar spine. Quality: sharp. The pain does not radiate. The pain is severe. The symptoms are aggravated by bending and certain positions (standing for long periods of time). The pain is the same all the time. Stiffness is present: no significant stiffness. Pertinent negatives include no chest pain, no fever, no numbness, no weight loss, no abdominal pain, no abdominal swelling, no bowel incontinence, no perianal numbness, no bladder incontinence, no dysuria, no leg pain, no paresthesias, no paresis, no tingling and no weakness. She has tried NSAIDs for the symptoms. The treatment provided mild (response with NSAIDS waxes and wanes) relief. Risk factors include obesity, lack of exercise, a sedentary lifestyle and menopause (no steroid use, not pregnant, not immunocompromised).    Past Medical History:  Diagnosis Date  . Alcohol abuse   . Anemia   . Bipolar 1 disorder ([place])   . Bipolar 1 disorder ([place])   . Bipolar disorder ([place])   . 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 Mental disorder   . Obesity   . Shortness of breath dyspnea   . UTI (lower urinary tract infection)   . Yeast infection of the vagina     Patient Active Problem List   Diagnosis Date Noted  . High grade squamous intraepithelial cervical dysplasia 05/24/2016  . HSIL (high grade squamous intraepithelial lesion) on Pap smear of cervix 04/15/2016  . BV (bacterial vaginosis) 03/27/2016  . UTI (lower urinary tract  infection) 01/31/2016  . DUB (dysfunctional uterine bleeding)   . Diastolic dysfunction [id number]  . Cellulitis 05/17/2015  . PICC line infection 05/17/2015  . Constipation 05/12/2015  . ESBL (extended spectrum beta-lactamase) producing bacteria infection 03/17/2015  . CAP (community acquired pneumonia) 03/17/2015  . Pneumonia 03/03/2015  . Diarrhea 03/03/2015  . Weakness 03/02/2015  . 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 Generalized anxiety disorder 02/20/2015  . Claustrophobia 02/20/2015  . Sepsis due to Gram negative bacteria (MDR E Coli) 02/18/2015  . Transaminitis 02/18/2015  . Chest pain 02/18/2015  . Sepsis ([place]) 02/18/2015  . Abdominal pain   . Dizziness   . Hypotension 01/04/2015  . Bipolar disorder ([place]) 06/29/2012  . LGSIL (low grade squamous intraepithelial lesion) on Pap smear 06/29/2012    Past Surgical History:  Procedure Laterality Date  . CHOLECYSTECTOMY    . HYSTEROSCOPY W/D&C N/A 01/25/2016   Procedure: DILATATION AND CURETTAGE /HYSTEROSCOPY;  Surgeon: Mora Bellman, MD;  Location: [facility];  Service: Gynecology;  Laterality: N/A;    OB History    Gravida Para Term Preterm AB Living   1 1 1     1    SAB TAB Ectopic Multiple Live Births           1       Home Medications    Prior to Admission medications   Medication Sig Start Date End Date Taking? Authorizing Provider  acetaminophen (TYLENOL) 500 MG tablet Take 1,000 mg by mouth every 6 (  six) hours as needed for moderate pain. Reported on 04/11/2016    Historical Provider, MD  amoxicillin-clavulanate (AUGMENTIN) 875-125 MG tablet Take 1 tablet by mouth 2 (two) times daily. Patient not taking: Reported on 05/24/2016 04/15/16   Mora Bellman, MD  benzonatate (TESSALON) 100 MG capsule Take 1 capsule (100 mg total) by mouth every 8 (eight) hours. Patient not taking: Reported on 03/09/2016 03/05/16   Carlisle Cater, PA-C  ciprofloxacin (CIPRO) 500 MG tablet Take 1 tablet (500 mg total) by mouth 2 (two) times  daily. Patient not taking: Reported on 05/24/2016 04/27/16   [name], PA-C  cyclobenzaprine (FLEXERIL) 10 MG tablet Take 1 tablet (10 mg total) by mouth 2 (two) times daily as needed for muscle spasms. 05/09/16   [name], PA-C  dicyclomine (BENTYL) 20 MG tablet Take 1 tablet (20 mg total) by mouth 2 (two) times daily. Patient not taking: Reported on 03/09/2016 03/05/16   Carlisle Cater, PA-C  fosfomycin (MONUROL) 3 g PACK Take 3 g by mouth once. Patient not taking: Reported on 04/01/2016 03/27/16   Woodroe Mode, MD  loperamide (IMODIUM) 2 MG capsule Take 1 capsule (2 mg total) by mouth 4 (four) times daily as needed for diarrhea or loose stools. Patient not taking: Reported on 03/09/2016 03/05/16   Carlisle Cater, PA-C  metroNIDAZOLE (FLAGYL) 500 MG tablet Take 1 tablet (500 mg total) by mouth once. Patient not taking: Reported on 04/11/2016 04/01/16   Leonard Schwartz, MD  metroNIDAZOLE (FLAGYL) 500 MG tablet Take 1 tablet (500 mg total) by mouth 2 (two) times daily. Patient not taking: Reported on 05/24/2016 04/15/16   Mora Bellman, MD  naproxen (NAPROSYN) 500 MG tablet Take 1 tablet (500 mg total) by mouth 2 (two) times daily with a meal. 05/09/16   [name], PA-C  terbinafine (LAMISIL) 250 MG tablet Take 250 mg by mouth daily. 03/20/16   Historical Provider, MD    Family History No family history on file.  Social History Social History  Substance Use Topics  . Smoking status: Never Smoker  . Smokeless tobacco: Never Used  . Alcohol use No     Allergies   Citalopram; Lamotrigine; Sulfa antibiotics; and Tramadol   Review of Systems Review of Systems  Constitutional: Negative for fever and weight loss.  HENT: Negative.   Eyes: Negative.   Respiratory: Negative for cough and shortness of breath.   Cardiovascular: Negative for chest pain.  Gastrointestinal: Negative for abdominal pain and bowel incontinence.  Genitourinary: Negative for bladder incontinence and dysuria.    Musculoskeletal: Positive for back pain.  Skin: Negative.   Neurological: Negative for tingling, weakness, numbness and paresthesias.     Physical Exam Updated Vital Signs LMP  (LMP Unknown)   Physical Exam  Constitutional: She is oriented to person, place, and time. She appears well-developed and well-nourished. She appears distressed (mild).  HENT:  Head: Normocephalic and atraumatic.  Eyes: Conjunctivae are normal. Right eye exhibits no discharge. Left eye exhibits no discharge. No scleral icterus.  Neck: Normal range of motion. Neck supple. No tracheal deviation present.  Cardiovascular: Normal rate, regular rhythm, normal heart sounds and intact distal pulses.   No murmur heard. Pulmonary/Chest: Effort normal and breath sounds normal. No stridor. No respiratory distress. She has no wheezes. She has no rales.  Abdominal: Soft. Bowel sounds are normal. She exhibits no distension. There is no tenderness. There is no rebound and no guarding.  Musculoskeletal: She exhibits tenderness (diffuse Low back bilat tenderness). She exhibits no  edema or deformity.  Neurological: She is alert and oriented to person, place, and time. She has normal strength. No sensory deficit. GCS eye subscore is 4. GCS verbal subscore is 5. GCS motor subscore is 6.  Skin: Skin is warm and dry. Capillary refill takes less than 2 seconds. She is not diaphoretic.  Psychiatric: She has a normal mood and affect. Her behavior is normal. Judgment and thought content normal.  Nursing note and vitals reviewed.    ED Treatments / Results  Labs (all labs ordered are listed, but only abnormal results are displayed) Labs Reviewed - No data to display  EKG  EKG Interpretation None       Radiology No results found.  Procedures Procedures (including critical care time)  Medications Ordered in ED Medications - No data to display   Initial Impression / Assessment and Plan / ED Course  I have reviewed the  triage vital signs and the nursing notes.  Pertinent labs & imaging results that were available during my care of the patient were reviewed by me and considered in my medical decision making (see chart for details).  Clinical Course   50 year old female with past medical history is chronic low back pain, seen frequently for similar complaint, presents to the emergency department for a new episode of this complaint. Patient denies any new exacerbating or alleviating factors. Patient does not have any red flags including her age, no trauma, no diabetes, no steroid use, no immunodeficiency and no IV drug use. Denies fevers or chills. Patient has negative straight leg test. Patient denies any symptoms associated with cauda equina. Patient reports she was diagnosed with osteoarthritis in the past. Does not have significant midline tenderness to palpation; equal throughout. Patient denies any urinary incontinence, dysuria, frequency which makes urinary tract infection unlikely.  Discussed at length with patient that narcotics or muscle relaxants would not resolve problem and that patient would need physical therapy and should focus on a weight loss to alleviate her systems. Patient stated she would discuss this with her new PCP. Also discouraged current regimen of Tylenol use which equates to about 4.5 g daily, especially in the setting of her alcohol use. Patient stated understanding and is in agreement with plan. Patient and vital signs stable at discharge. Usual and customary return precautions for back pain were discussed.  Final Clinical Impressions(s) / ED Diagnoses   Final diagnoses:  Bilateral low back pain without sciatica    New Prescriptions Discharge Medication List as of 05/27/2016  8:51 AM       Darlina Rumpf, MD 05/30/16 [id number]    Carmin Muskrat, MD 06/03/16 [phone]

## 2016-05-28 ENCOUNTER — Emergency Department (HOSPITAL_COMMUNITY)
Admission: EM | Admit: 2016-05-28 | Discharge: 2016-05-28 | Disposition: A | Discharge status: Home / Self Care | Payer: Medicaid Other | Attending: Emergency Medicine | Admitting: Emergency Medicine

## 2016-05-28 ENCOUNTER — Encounter (HOSPITAL_COMMUNITY): Payer: Self-pay | Admitting: Vascular Surgery

## 2016-05-28 ENCOUNTER — Telehealth: Payer: Self-pay | Admitting: *Deleted

## 2016-05-28 DIAGNOSIS — M545 Low back pain: Secondary | ICD-10-CM | POA: Diagnosis not present

## 2016-05-28 DIAGNOSIS — M549 Dorsalgia, unspecified: Secondary | ICD-10-CM

## 2016-05-28 DIAGNOSIS — G8929 Other chronic pain: Secondary | ICD-10-CM

## 2016-05-28 MED ORDER — METHOCARBAMOL 500 MG PO TABS: 500.0000 mg | ORAL_TABLET | Freq: Two times a day (BID) | ORAL | 0 refills | Status: DC

## 2016-05-28 MED ORDER — DICLOFENAC SODIUM 50 MG PO TBEC: 50.0000 mg | DELAYED_RELEASE_TABLET | Freq: Two times a day (BID) | ORAL | 0 refills | Status: DC

## 2016-05-28 MED ORDER — KETOROLAC TROMETHAMINE 60 MG/2ML IM SOLN
60.0000 mg | Freq: Once | INTRAMUSCULAR | Status: AC
  Administered 2016-05-28: 60 mg via INTRAMUSCULAR
  Filled 2016-05-28: qty 2

## 2016-05-28 MED ORDER — METHOCARBAMOL 500 MG PO TABS
750.0000 mg | ORAL_TABLET | Freq: Once | ORAL | Status: AC
  Administered 2016-05-28: 750 mg via ORAL
  Filled 2016-05-28: qty 2

## 2016-05-28 NOTE — ED Triage Notes (Signed)
Pt reports to the ED for eval of low back pain x 2-3 months. Was seen here yesterday. She is ambulatory. Pt was d/c and told to f/u with PT but she was unable to get with them. Denies any numbness, tingling, paralysis, or bowel or bladder changes.

## 2016-05-28 NOTE — Telephone Encounter (Signed)
Pt called and stated that she was supposed to be getting a call about having her surgery scheduled. She has not heard anything yet and would appreciate a call back.

## 2016-05-28 NOTE — ED Provider Notes (Signed)
[facility] DEPT Provider Note   CSN: [id number] Arrival date & time: 05/28/16  [id number]  First Provider Contact:  None    By signing my name below, I, Higinio Plan, attest that this documentation has been prepared under the direction and in the presence of non-physician practitioner, Debroah Baller, NP. Electronically Signed: Higinio Plan, Scribe. 05/28/2016. 7:58 PM.  History   Chief Complaint Chief Complaint  Patient presents with  . Back Pain   The history is provided by the patient. No language interpreter was used.   HPI Comments: Jordan [name] is a 50 y.o. female with PMHx of mental disorder, UTI and BV, who presents to the Emergency Department complaining of gradually worsening, constant, 10/10, back pain that began 2 months ago and worsened today. Pt reports she has visited the ED multiple times for similar symptoms in which she was told she had arthritis and prescribed naproxen and flexeril on 7/20 with no relief. She states she has also taken tylenol with no relief. Pt states she visited the ED last night for bilateral lower back pain and was told she needed to begin physical therapy for her chronic back pain. Pt also notes a hx of multiple UTIs; she states associated malodorous urine now in the ED. She saw her GYN a few days ago and awaiting results. She denies urinary frequency, dysuria and bilateral knee pain. She notes her primary mode of footwear is flip-flops.  Past Medical History:  Diagnosis Date  . Alcohol abuse   . Anemia   . Bipolar 1 disorder ([place])   . Bipolar 1 disorder ([place])   . Bipolar disorder ([place])   . 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 Mental disorder   . Obesity   . Shortness of breath dyspnea   . UTI (lower urinary tract infection)   . Yeast infection of the vagina    Patient Active Problem List   Diagnosis Date Noted  . High grade squamous intraepithelial cervical dysplasia 05/24/2016  . HSIL (high grade squamous intraepithelial  lesion) on Pap smear of cervix 04/15/2016  . BV (bacterial vaginosis) 03/27/2016  . UTI (lower urinary tract infection) 01/31/2016  . DUB (dysfunctional uterine bleeding)   . Diastolic dysfunction [id number]  . Cellulitis 05/17/2015  . PICC line infection 05/17/2015  . Constipation 05/12/2015  . ESBL (extended spectrum beta-lactamase) producing bacteria infection 03/17/2015  . CAP (community acquired pneumonia) 03/17/2015  . Pneumonia 03/03/2015  . Diarrhea 03/03/2015  . Weakness 03/02/2015  . 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 Generalized anxiety disorder 02/20/2015  . Claustrophobia 02/20/2015  . Sepsis due to Gram negative bacteria (MDR E Coli) 02/18/2015  . Transaminitis 02/18/2015  . Chest pain 02/18/2015  . Sepsis ([place]) 02/18/2015  . Abdominal pain   . Dizziness   . Hypotension 01/04/2015  . Bipolar disorder ([place]) 06/29/2012  . LGSIL (low grade squamous intraepithelial lesion) on Pap smear 06/29/2012   Past Surgical History:  Procedure Laterality Date  . CHOLECYSTECTOMY    . HYSTEROSCOPY W/D&C N/A 01/25/2016   Procedure: DILATATION AND CURETTAGE /HYSTEROSCOPY;  Surgeon: Mora Bellman, MD;  Location: [facility];  Service: Gynecology;  Laterality: N/A;   OB History    Gravida Para Term Preterm AB Living   1 1 1     1    SAB TAB Ectopic Multiple Live Births           1     Home Medications    Prior to Admission medications   Medication Sig Start  Date End Date Taking? Authorizing Provider  acetaminophen (TYLENOL) 500 MG tablet Take 1,000 mg by mouth every 6 (six) hours as needed for moderate pain. Reported on 04/11/2016    Historical Provider, MD  amoxicillin-clavulanate (AUGMENTIN) 875-125 MG tablet Take 1 tablet by mouth 2 (two) times daily. Patient not taking: Reported on 05/24/2016 04/15/16   Mora Bellman, MD  benzonatate (TESSALON) 100 MG capsule Take 1 capsule (100 mg total) by mouth every 8 (eight) hours. Patient not taking: Reported on 03/09/2016 03/05/16   Carlisle Cater, PA-C  ciprofloxacin (CIPRO) 500 MG tablet Take 1 tablet (500 mg total) by mouth 2 (two) times daily. Patient not taking: Reported on 05/24/2016 04/27/16   [name], PA-C  cyclobenzaprine (FLEXERIL) 10 MG tablet Take 1 tablet (10 mg total) by mouth 2 (two) times daily as needed for muscle spasms. 05/09/16   [name], PA-C  diclofenac (VOLTAREN) 50 MG EC tablet Take 1 tablet (50 mg total) by mouth 2 (two) times daily. 05/28/16   Shia Delaine Bunnie Pion, NP  dicyclomine (BENTYL) 20 MG tablet Take 1 tablet (20 mg total) by mouth 2 (two) times daily. Patient not taking: Reported on 03/09/2016 03/05/16   Carlisle Cater, PA-C  fosfomycin (MONUROL) 3 g PACK Take 3 g by mouth once. Patient not taking: Reported on 04/01/2016 03/27/16   Woodroe Mode, MD  loperamide (IMODIUM) 2 MG capsule Take 1 capsule (2 mg total) by mouth 4 (four) times daily as needed for diarrhea or loose stools. Patient not taking: Reported on 03/09/2016 03/05/16   Carlisle Cater, PA-C  methocarbamol (ROBAXIN) 500 MG tablet Take 1 tablet (500 mg total) by mouth 2 (two) times daily. 05/28/16   Beyonca Wisz Bunnie Pion, NP  metroNIDAZOLE (FLAGYL) 500 MG tablet Take 1 tablet (500 mg total) by mouth once. Patient not taking: Reported on 04/11/2016 04/01/16   Leonard Schwartz, MD  metroNIDAZOLE (FLAGYL) 500 MG tablet Take 1 tablet (500 mg total) by mouth 2 (two) times daily. Patient not taking: Reported on 05/24/2016 04/15/16   Mora Bellman, MD  naproxen (NAPROSYN) 500 MG tablet Take 1 tablet (500 mg total) by mouth 2 (two) times daily with a meal. 05/09/16   [name], PA-C  terbinafine (LAMISIL) 250 MG tablet Take 250 mg by mouth daily. 03/20/16   Historical Provider, MD   Family History No family history on file.  Social History Social History  Substance Use Topics  . Smoking status: Never Smoker  . Smokeless tobacco: Never Used  . Alcohol use No   Allergies   Citalopram; Lamotrigine; Sulfa antibiotics; and Tramadol  Review of Systems Review of  Systems  Genitourinary: Negative for dysuria and frequency.  Musculoskeletal: Positive for back pain.   Physical Exam Updated Vital Signs BP (!) 169/119 (BP Location: Right Arm)   Pulse 114   Temp 98.2 F (36.8 C) (Oral)   Resp 16   LMP  (LMP Unknown)   SpO2 97%   Physical Exam  Constitutional: She is oriented to person, place, and time. No distress.  Morbidly obese. Weight 374.4 pounds  HENT:  Head: Normocephalic and atraumatic.  Eyes: Conjunctivae are normal. Pupils are equal, round, and reactive to light. Right eye exhibits no discharge. Left eye exhibits no discharge. No scleral icterus.  Neck: Normal range of motion. No JVD present. No tracheal deviation present.  Pulmonary/Chest: Effort normal. No stridor.  Abdominal: Soft. Bowel sounds are normal. There is no tenderness.  Musculoskeletal:  No cervical spine or thoracic spine tenderness  Bilateral lumbar tenderness; no tenderness over the actual lumbar spine  Steady gait; no foot drop  Neurological: She is alert and oriented to person, place, and time. Coordination normal.  Distal pulses intact; adequate circulation Grips are equal bilaterally; radial pulses 2+ Reflexes are symmetric   Psychiatric: She has a normal mood and affect. Her behavior is normal. Judgment and thought content normal.  Nursing note and vitals reviewed.  ED Treatments / Results  Labs (all labs ordered are listed, but only abnormal results are displayed) Labs Reviewed - No data to display  Radiology No results found.  Procedures Procedures  DIAGNOSTIC STUDIES:  Oxygen Saturation is 97% on RA, normal by my interpretation.    COORDINATION OF CARE:  7:56 PM Discussed treatment plan with pt at bedside and pt agreed to plan. Pt has been advised to follow-up with an orthopedic specialist.   Medications Ordered in ED Medications  methocarbamol (ROBAXIN) tablet 750 mg (750 mg Oral Given 05/28/16 2007)  ketorolac (TORADOL) injection 60 mg (60 mg  Intramuscular Given 05/28/16 2007)    Initial Impression / Assessment and Plan / ED Course  I have reviewed the triage vital signs and the nursing notes.   Clinical Course  Robaxin and Toradol given while in the ED and patient reports significant relief.  Discussed with the patient clinical findings and plan of care and all questioned fully answered. She f/u with ortho or her PCP. She will return here as needed.     I personally performed the services described in this documentation, which was scribed in my presence. The recorded information has been reviewed and is accurate.   Final Clinical Impressions(s) / ED Diagnoses   Final diagnoses:  Chronic back pain   Patient with back pain.  No neurological deficits on exam.  Patient is ambulatory.  No loss of bowel or bladder control.  No concern for cauda equina.  No fever, night sweats, weight loss, h/o cancer, IVDA, no recent procedure to back. No urinary symptoms suggestive of UTI.  Supportive care and return precaution discussed. Appears safe for discharge at this time. Follow up as indicated in discharge paperwork.   New Prescriptions Discharge Medication List as of 05/28/2016  9:02 PM    START taking these medications   Details  diclofenac (VOLTAREN) 50 MG EC tablet Take 1 tablet (50 mg total) by mouth 2 (two) times daily., Starting Tue 05/28/2016, Print    methocarbamol (ROBAXIN) 500 MG tablet Take 1 tablet (500 mg total) by mouth 2 (two) times daily., Starting Tue 05/28/2016, [address] [place], NP 05/28/16 [id number]    Julianne Rice, MD 05/30/16 [phone]

## 2016-05-28 NOTE — Discharge Instructions (Signed)
Call Dr. Debroah Loop office for follow up for your back pain.

## 2016-05-29 ENCOUNTER — Telehealth: Payer: Self-pay | Admitting: *Deleted

## 2016-05-29 NOTE — Telephone Encounter (Signed)
Jordan [name] called this afternoon and states she is supposed to be scheduled for surgery- wants [name] to call her. Per chart review has spoken with a nurse in our office this afternoon and was told we do not schedule the surgery- but the office that does schedule it will get in touch with her once it is scheduled.   I called [name], OB/GYN office who schedules the surgery and asked her to call patient and talk with her. She states she will call her today. ( but surgery isn't scheduled yet, but she is working on it).

## 2016-05-29 NOTE — Telephone Encounter (Signed)
Spoke with patient regarding her concerns. Advised her that surgery schedulers will call her.

## 2016-05-30 ENCOUNTER — Encounter (HOSPITAL_COMMUNITY): Payer: Self-pay | Admitting: *Deleted

## 2016-06-01 ENCOUNTER — Emergency Department (HOSPITAL_COMMUNITY)
Admission: EM | Admit: 2016-06-01 | Discharge: 2016-06-01 | Disposition: A | Discharge status: Home / Self Care | Payer: Medicaid Other | Attending: Emergency Medicine | Admitting: Emergency Medicine

## 2016-06-01 ENCOUNTER — Encounter (HOSPITAL_COMMUNITY): Payer: Self-pay | Admitting: *Deleted

## 2016-06-01 DIAGNOSIS — F319 Bipolar disorder, unspecified: Secondary | ICD-10-CM | POA: Diagnosis not present

## 2016-06-01 DIAGNOSIS — N898 Other specified noninflammatory disorders of vagina: Secondary | ICD-10-CM | POA: Diagnosis present

## 2016-06-01 DIAGNOSIS — N760 Acute vaginitis: Secondary | ICD-10-CM | POA: Diagnosis not present

## 2016-06-01 LAB — PREGNANCY, URINE: Preg Test, Ur: NEGATIVE

## 2016-06-01 LAB — WET PREP, GENITAL
Clue Cells Wet Prep HPF POC: NONE SEEN
Sperm: NONE SEEN
Trich, Wet Prep: NONE SEEN
Yeast Wet Prep HPF POC: NONE SEEN

## 2016-06-01 MED ORDER — AZITHROMYCIN 250 MG PO TABS
1000.0000 mg | ORAL_TABLET | Freq: Once | ORAL | Status: AC
  Administered 2016-06-01: 1000 mg via ORAL
  Filled 2016-06-01: qty 4

## 2016-06-01 MED ORDER — CEFTRIAXONE SODIUM 250 MG IJ SOLR
250.0000 mg | Freq: Once | INTRAMUSCULAR | Status: AC
  Administered 2016-06-01: 250 mg via INTRAMUSCULAR
  Filled 2016-06-01: qty 250

## 2016-06-01 MED ORDER — STERILE WATER FOR INJECTION IJ SOLN
INTRAMUSCULAR | Status: AC
  Filled 2016-06-01: qty 10

## 2016-06-01 NOTE — Discharge Instructions (Signed)
It was our pleasure to provide your ER care today - we hope that you feel better.  We sent swabs/cultures to the lab, the results of which will be back in 2 days time.  We did treat you with 2 antibiotics in the ER today.   Keep the skin in area of groin creases, hips, perineal area very clean.  Wash area with soap and water 2x/day.  Follow up with your doctor in 1 weeks time for recheck if symptoms fail to improve/resolve.  Return to ER if worse, new symptoms, fevers, severe abdominal pain, other concern.

## 2016-06-01 NOTE — ED Triage Notes (Signed)
Pt reports vaginal odor to a month and abdominal pain last night. Pt denies pain at this time. Pt denies n/v/v, constipation, vaginal itching, vaginal bleeding/discharge, denies dysuria.

## 2016-06-01 NOTE — ED Provider Notes (Addendum)
[facility] DEPT Provider Note   CSN: [id number] Arrival date & time: 06/01/16  1656  First Provider Contact:  None       History   Chief Complaint Chief Complaint  Patient presents with  . Other    Vaginal odor    HPI Jordan [name] is a 50 y.o. female.  Patient c/o vaginal odor, ?mild discharge, for the past 2 months. No acute or abrupt change today. No dysuria. No abdominal pain. No flank or back pain. No known std exposure. No recent change in meds or new meds. No fever or chills. No faintness or dizziness. Symptoms persistent, constant since onset, without specific exacerbating or alleviating factors.       The history is provided by the patient and the spouse.    Past Medical History:  Diagnosis Date  . Alcohol abuse   . Anemia   . Bipolar 1 disorder ([place])   . Bipolar 1 disorder ([place])   . Bipolar disorder ([place])   . 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 Mental disorder   . Obesity   . Shortness of breath dyspnea   . UTI (lower urinary tract infection)   . Yeast infection of the vagina     Patient Active Problem List   Diagnosis Date Noted  . High grade squamous intraepithelial cervical dysplasia 05/24/2016  . HSIL (high grade squamous intraepithelial lesion) on Pap smear of cervix 04/15/2016  . BV (bacterial vaginosis) 03/27/2016  . UTI (lower urinary tract infection) 01/31/2016  . DUB (dysfunctional uterine bleeding)   . Diastolic dysfunction [id number]  . Cellulitis 05/17/2015  . PICC line infection 05/17/2015  . Constipation 05/12/2015  . ESBL (extended spectrum beta-lactamase) producing bacteria infection 03/17/2015  . CAP (community acquired pneumonia) 03/17/2015  . Pneumonia 03/03/2015  . Diarrhea 03/03/2015  . Weakness 03/02/2015  . 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 Generalized anxiety disorder 02/20/2015  . Claustrophobia 02/20/2015  . Sepsis due to Gram negative bacteria (MDR E Coli) 02/18/2015  . Transaminitis  02/18/2015  . Chest pain 02/18/2015  . Sepsis ([place]) 02/18/2015  . Abdominal pain   . Dizziness   . Hypotension 01/04/2015  . Bipolar disorder ([place]) 06/29/2012  . LGSIL (low grade squamous intraepithelial lesion) on Pap smear 06/29/2012    Past Surgical History:  Procedure Laterality Date  . CHOLECYSTECTOMY    . HYSTEROSCOPY W/D&C N/A 01/25/2016   Procedure: DILATATION AND CURETTAGE /HYSTEROSCOPY;  Surgeon: Mora Bellman, MD;  Location: [facility];  Service: Gynecology;  Laterality: N/A;    OB History    Gravida Para Term Preterm AB Living   1 1 1     1    SAB TAB Ectopic Multiple Live Births           1       Home Medications    Prior to Admission medications   Medication Sig Start Date End Date Taking? Authorizing Provider  acetaminophen (TYLENOL) 500 MG tablet Take 1,000 mg by mouth every 6 (six) hours as needed for moderate pain. Reported on 04/11/2016    Historical Provider, MD  amoxicillin-clavulanate (AUGMENTIN) 875-125 MG tablet Take 1 tablet by mouth 2 (two) times daily. Patient not taking: Reported on 05/24/2016 04/15/16   Mora Bellman, MD  benzonatate (TESSALON) 100 MG capsule Take 1 capsule (100 mg total) by mouth every 8 (eight) hours. Patient not taking: Reported on 03/09/2016 03/05/16   Carlisle Cater, PA-C  ciprofloxacin (CIPRO) 500 MG tablet Take 1 tablet (500 mg total) by  mouth 2 (two) times daily. Patient not taking: Reported on 05/24/2016 04/27/16   [name], PA-C  cyclobenzaprine (FLEXERIL) 10 MG tablet Take 1 tablet (10 mg total) by mouth 2 (two) times daily as needed for muscle spasms. 05/09/16   [name], PA-C  diclofenac (VOLTAREN) 50 MG EC tablet Take 1 tablet (50 mg total) by mouth 2 (two) times daily. 05/28/16   Hope Bunnie Pion, NP  dicyclomine (BENTYL) 20 MG tablet Take 1 tablet (20 mg total) by mouth 2 (two) times daily. Patient not taking: Reported on 03/09/2016 03/05/16   Carlisle Cater, PA-C  fosfomycin (MONUROL) 3 g PACK Take 3 g by mouth once. Patient  not taking: Reported on 04/01/2016 03/27/16   Woodroe Mode, MD  loperamide (IMODIUM) 2 MG capsule Take 1 capsule (2 mg total) by mouth 4 (four) times daily as needed for diarrhea or loose stools. Patient not taking: Reported on 03/09/2016 03/05/16   Carlisle Cater, PA-C  methocarbamol (ROBAXIN) 500 MG tablet Take 1 tablet (500 mg total) by mouth 2 (two) times daily. 05/28/16   Hope Bunnie Pion, NP  metroNIDAZOLE (FLAGYL) 500 MG tablet Take 1 tablet (500 mg total) by mouth once. Patient not taking: Reported on 04/11/2016 04/01/16   Leonard Schwartz, MD  metroNIDAZOLE (FLAGYL) 500 MG tablet Take 1 tablet (500 mg total) by mouth 2 (two) times daily. Patient not taking: Reported on 05/24/2016 04/15/16   Mora Bellman, MD  naproxen (NAPROSYN) 500 MG tablet Take 1 tablet (500 mg total) by mouth 2 (two) times daily with a meal. 05/09/16   [name], PA-C  terbinafine (LAMISIL) 250 MG tablet Take 250 mg by mouth daily. 03/20/16   Historical Provider, MD    Family History History reviewed. No pertinent family history.  Social History Social History  Substance Use Topics  . Smoking status: Never Smoker  . Smokeless tobacco: Never Used  . Alcohol use No     Allergies   Citalopram; Lamotrigine; Sulfa antibiotics; and Tramadol   Review of Systems Review of Systems  Constitutional: Negative for chills and fever.  HENT: Negative for sore throat.   Eyes: Negative for redness.  Respiratory: Negative for shortness of breath.   Cardiovascular: Negative for chest pain.  Gastrointestinal: Negative for abdominal pain, diarrhea and vomiting.  Genitourinary: Positive for vaginal discharge. Negative for flank pain and vaginal bleeding.  Musculoskeletal: Negative for back pain and neck pain.  Skin: Negative for rash.  Neurological: Negative for headaches.  Hematological: Does not bruise/bleed easily.  Psychiatric/Behavioral: Negative for confusion.     Physical Exam Updated Vital Signs BP 135/89 (BP Location:  Right Arm)   Pulse 109   Temp 98.5 F (36.9 C) (Oral)   Resp 20   Ht 5\' 8"  (1.727 m)   Wt (!) 161.5 kg   LMP  (LMP Unknown)   SpO2 97%   BMI 54.13 kg/m   Physical Exam  Constitutional: She appears well-developed and well-nourished. No distress.  Eyes: Conjunctivae are normal. No scleral icterus.  Neck: Neck supple. No tracheal deviation present.  Cardiovascular: Normal rate, regular rhythm, normal heart sounds and intact distal pulses.   Pulmonary/Chest: Effort normal and breath sounds normal. No respiratory distress.  Abdominal: Soft. Normal appearance and bowel sounds are normal. She exhibits no distension. There is no tenderness.  Genitourinary:  Genitourinary Comments: No cva tenderness. Mild-mod whitish yellow malodorous vaginal discharge. No cmt. No adx masses/tenderness.   Musculoskeletal: She exhibits no edema.  Neurological: She is alert.  Skin:  Skin is warm and dry. No rash noted. She is not diaphoretic.  Psychiatric: She has a normal mood and affect.  Nursing note and vitals reviewed.    ED Treatments / Results  Labs (all labs ordered are listed, but only abnormal results are displayed) Results for orders placed or performed during the hospital encounter of 06/01/16  Wet prep, genital  Result Value Ref Range   Yeast Wet Prep HPF POC NONE SEEN NONE SEEN   Trich, Wet Prep NONE SEEN NONE SEEN   Clue Cells Wet Prep HPF POC NONE SEEN NONE SEEN   WBC, Wet Prep HPF POC MANY (A) NONE SEEN   Sperm NONE SEEN   Pregnancy, urine  Result Value Ref Range   Preg Test, Ur NEGATIVE NEGATIVE     EKG  EKG Interpretation None       Radiology No results found.  Procedures Procedures (including critical care time)  Medications Ordered in ED Medications - No data to display   Initial Impression / Assessment and Plan / ED Course  I have reviewed the triage vital signs and the nursing notes.  Pertinent labs & imaging results that were available during my care of  the patient were reviewed by me and considered in my medical decision making (see chart for details).  Clinical Course    Pelvic exam, swabs sent.   Reviewed nursing notes and prior charts for additional history.   Patient w d/c from cervix, no cmt, no abd pain or tenderness. Afeb.  Will rx rocephin and zithromax in ED.   Wet prep otherwise neg.  Rec outpatient gyn follow up.     Final Clinical Impressions(s) / ED Diagnoses   Final diagnoses:  None    New Prescriptions New Prescriptions   No medications on file         Lajean Saver, MD 06/01/16 2014

## 2016-06-03 LAB — GC/CHLAMYDIA PROBE AMP (~~LOC~~) NOT AT ARMC
Chlamydia: NEGATIVE
Neisseria Gonorrhea: NEGATIVE

## 2016-06-07 ENCOUNTER — Encounter (HOSPITAL_COMMUNITY): Payer: Self-pay | Admitting: *Deleted

## 2016-06-26 ENCOUNTER — Inpatient Hospital Stay (HOSPITAL_COMMUNITY)
Admission: AD | Admit: 2016-06-26 | Discharge: 2016-06-26 | Disposition: A | Discharge status: Home / Self Care | Payer: Medicaid Other | Source: Ambulatory Visit | Attending: Obstetrics & Gynecology | Admitting: Obstetrics & Gynecology

## 2016-06-26 ENCOUNTER — Encounter (HOSPITAL_COMMUNITY): Payer: Self-pay

## 2016-06-26 DIAGNOSIS — N898 Other specified noninflammatory disorders of vagina: Secondary | ICD-10-CM | POA: Insufficient documentation

## 2016-06-26 DIAGNOSIS — N390 Urinary tract infection, site not specified: Secondary | ICD-10-CM | POA: Diagnosis not present

## 2016-06-26 DIAGNOSIS — Z79899 Other long term (current) drug therapy: Secondary | ICD-10-CM | POA: Diagnosis not present

## 2016-06-26 LAB — WET PREP, GENITAL
Clue Cells Wet Prep HPF POC: NONE SEEN
Sperm: NONE SEEN
Trich, Wet Prep: NONE SEEN
Yeast Wet Prep HPF POC: NONE SEEN

## 2016-06-26 LAB — URINE MICROSCOPIC-ADD ON

## 2016-06-26 LAB — URINALYSIS, ROUTINE W REFLEX MICROSCOPIC
Bilirubin Urine: NEGATIVE
Glucose, UA: NEGATIVE mg/dL
Ketones, ur: NEGATIVE mg/dL
Nitrite: POSITIVE — AB
Protein, ur: NEGATIVE mg/dL
Specific Gravity, Urine: 1.02 (ref 1.005–1.030)
pH: 6 (ref 5.0–8.0)

## 2016-06-26 MED ORDER — NITROFURANTOIN MONOHYD MACRO 100 MG PO CAPS: 100.0000 mg | ORAL_CAPSULE | Freq: Two times a day (BID) | ORAL | 0 refills | Status: DC

## 2016-06-26 MED ORDER — NITROFURANTOIN MONOHYD MACRO 100 MG PO CAPS
100.0000 mg | ORAL_CAPSULE | Freq: Once | ORAL | Status: AC
  Administered 2016-06-26: 100 mg via ORAL
  Filled 2016-06-26: qty 1

## 2016-06-26 NOTE — Discharge Instructions (Signed)

## 2016-06-26 NOTE — MAU Provider Note (Signed)
Chief Complaint:  Vaginal odor   First Provider Initiated Contact with Patient 06/26/16 1945      HPI: Jordan [name] is a 50 y.o. G1P1001 who presents to maternity admissions reporting Vaginal Odor.  Worried she has an infection.  States "they always tell me it's nothing".  States "it smells like something died in there".  Has a cone biopsy scheduled for Monday for abnormal pap (could not tolerate colposcopy). She reports no vaginal bleeding, vaginal itching/burning, urinary symptoms, h/a, dizziness, n/v, or fever/chills.    Has multiple other questions and comments which are not related to her primary complaint. Such as having to use a bedpan to urinate at night due to not being able to get out of bed, worries about sexual drive with menopause, wanting to know how long menopause lasts.   Vaginal Discharge  The patient's primary symptoms include a genital odor and vaginal discharge. The patient's pertinent negatives include no genital itching, genital lesions, genital rash or pelvic pain. This is a recurrent problem. The current episode started in the past 7 days. The problem occurs constantly. The problem has been unchanged. The patient is experiencing no pain. She is not pregnant. Pertinent negatives include no abdominal pain, back pain, constipation, diarrhea, dysuria, fever, nausea or vomiting. The vaginal discharge was clear. There has been no bleeding. She has not been passing clots. She has not been passing tissue. Nothing aggravates the symptoms. She has tried nothing for the symptoms. She is sexually active. No, her partner does not have an STD. She uses nothing for contraception. She is postmenopausal.   RN Note:   Pt presents to MAU with a vaginal odor. Reports no discharge or urinary symptoms. Has a biopsy scheduled for Monday.        Past Medical History: Past Medical History:  Diagnosis Date  . Alcohol abuse   . Anemia   . Bipolar 1 disorder ([facility])    No medications currently   . Bipolar 1 disorder ([place])   . Bipolar disorder ([place])   . 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 Mental disorder   . Obesity   . SVD (spontaneous vaginal delivery)    x 1  . UTI (lower urinary tract infection)   . Yeast infection of the vagina     Past obstetric history: OB History  Gravida Para Term Preterm AB Living  1 1 1     1   SAB TAB Ectopic Multiple Live Births          1    # Outcome Date GA Lbr Len/2nd Weight Sex Delivery Anes PTL Lv  1 Term 05/17/96 [redacted]w[redacted]d   F Vag-Spont EPI N LIV      Past Surgical History: Past Surgical History:  Procedure Laterality Date  . CHOLECYSTECTOMY    . HYSTEROSCOPY W/D&C N/A 01/25/2016   Procedure: DILATATION AND CURETTAGE /HYSTEROSCOPY;  Surgeon: Mora Bellman, MD;  Location: [facility];  Service: Gynecology;  Laterality: N/A;    Family History: History reviewed. No pertinent family history.  Social History: Social History  Substance Use Topics  . Smoking status: Never Smoker  . Smokeless tobacco: Never Used  . Alcohol use No    Allergies:  Allergies  Allergen Reactions  . Citalopram Other (See Comments)    Possible cause of pancytopenia. Swelling of tongue, face and throat  . Lamotrigine Other (See Comments)    Possible cause of pancytopenia. Swelling of face, throat and tongue  . Sulfa Antibiotics Anaphylaxis  .  Tramadol Anaphylaxis, Shortness Of Breath and Swelling    Meds:  Prescriptions Prior to Admission  Medication Sig Dispense Refill Last Dose  . acetaminophen (TYLENOL) 500 MG tablet Take 1,000 mg by mouth every 6 (six) hours as needed for moderate pain. Reported on 04/11/2016   06/25/2016 at Unknown time  . amoxicillin-clavulanate (AUGMENTIN) 875-125 MG tablet Take 1 tablet by mouth 2 (two) times daily. (Patient not taking: Reported on 05/24/2016) 14 tablet 0 Completed Course at Unknown time  . benzonatate (TESSALON) 100 MG capsule Take 1 capsule (100 mg total) by mouth every 8 (eight) hours.  (Patient not taking: Reported on 03/09/2016) 15 capsule 0 Not Taking at Unknown time  . ciprofloxacin (CIPRO) 500 MG tablet Take 1 tablet (500 mg total) by mouth 2 (two) times daily. (Patient not taking: Reported on 05/24/2016) 14 tablet 0 Completed Course at Unknown time  . cyclobenzaprine (FLEXERIL) 10 MG tablet Take 1 tablet (10 mg total) by mouth 2 (two) times daily as needed for muscle spasms. (Patient not taking: Reported on 06/19/2016) 20 tablet 0 Not Taking at Unknown time  . diclofenac (VOLTAREN) 50 MG EC tablet Take 1 tablet (50 mg total) by mouth 2 (two) times daily. (Patient not taking: Reported on 06/19/2016) 15 tablet 0 Not Taking at Unknown time  . dicyclomine (BENTYL) 20 MG tablet Take 1 tablet (20 mg total) by mouth 2 (two) times daily. (Patient not taking: Reported on 03/09/2016) 20 tablet 0 Not Taking at Unknown time  . fosfomycin (MONUROL) 3 g PACK Take 3 g by mouth once. (Patient not taking: Reported on 04/01/2016) 1 g 0 Not Taking at Unknown time  . loperamide (IMODIUM) 2 MG capsule Take 1 capsule (2 mg total) by mouth 4 (four) times daily as needed for diarrhea or loose stools. (Patient not taking: Reported on 03/09/2016) 12 capsule 0 Not Taking at Unknown time  . methocarbamol (ROBAXIN) 500 MG tablet Take 1 tablet (500 mg total) by mouth 2 (two) times daily. (Patient not taking: Reported on 06/19/2016) 20 tablet 0 Not Taking at Unknown time  . metroNIDAZOLE (FLAGYL) 500 MG tablet Take 1 tablet (500 mg total) by mouth once. (Patient not taking: Reported on 04/11/2016) 14 tablet 0 Completed Course at Unknown time  . metroNIDAZOLE (FLAGYL) 500 MG tablet Take 1 tablet (500 mg total) by mouth 2 (two) times daily. (Patient not taking: Reported on 05/24/2016) 14 tablet 0 Completed Course at Unknown time  . naproxen (NAPROSYN) 500 MG tablet Take 1 tablet (500 mg total) by mouth 2 (two) times daily with a meal. (Patient not taking: Reported on 06/19/2016) 30 tablet 0 Not Taking at Unknown time    I  have reviewed patient's Past Medical Hx, Surgical Hx, Family Hx, Social Hx, medications and allergies.  ROS:  Review of Systems  Constitutional: Negative for fever.  Gastrointestinal: Negative for abdominal pain, constipation, diarrhea, nausea and vomiting.  Genitourinary: Positive for vaginal discharge. Negative for difficulty urinating, dysuria and pelvic pain.  Musculoskeletal: Negative for back pain.   Other systems negative     Physical Exam  Patient Vitals for the past 24 hrs:  BP Temp Temp src Pulse Resp Height Weight  06/26/16 1938 124/65 - - - - - -  06/26/16 1937 156/87 98.4 F (36.9 C) Oral 99 20 5\' 8"  (1.727 m) (!) 360 lb (163.3 kg)   Constitutional: Well-developed, well-nourished female in no acute distress.  Cardiovascular: normal rate and rhythm Respiratory: normal effort, no distress. GI: Abd soft, non-tender.  Nondistended.  No rebound, No guarding.   MS: Extremities nontender, no edema, normal ROM Neurologic: Alert and oriented x 4.   Grossly nonfocal. GU: Neg CVAT. Skin:  Warm and Dry Psych:  Affect appropriate.  PELVIC EXAM: Scant white creamy discharge, vaginal walls and external genitalia normal     Labs: Results for orders placed or performed during the hospital encounter of 06/26/16 (from the past 24 hour(s))  Urinalysis, Routine w reflex microscopic (not at [facility])     Status: Abnormal   Collection Time: 06/26/16  7:28 PM  Result Value Ref Range   Color, Urine YELLOW YELLOW   APPearance CLEAR CLEAR   Specific Gravity, Urine 1.020 1.005 - 1.030   pH 6.0 5.0 - 8.0   Glucose, UA NEGATIVE NEGATIVE mg/dL   Hgb urine dipstick SMALL (A) NEGATIVE   Bilirubin Urine NEGATIVE NEGATIVE   Ketones, ur NEGATIVE NEGATIVE mg/dL   Protein, ur NEGATIVE NEGATIVE mg/dL   Nitrite POSITIVE (A) NEGATIVE   Leukocytes, UA LARGE (A) NEGATIVE  Urine microscopic-add on     Status: Abnormal   Collection Time: 06/26/16  7:28 PM  Result Value Ref Range   Squamous  Epithelial / LPF 0-5 (A) NONE SEEN   WBC, UA 6-30 0 - 5 WBC/hpf   RBC / HPF 6-30 0 - 5 RBC/hpf   Bacteria, UA FEW (A) NONE SEEN  Wet prep, genital     Status: Abnormal   Collection Time: 06/26/16  7:55 PM  Result Value Ref Range   Yeast Wet Prep HPF POC NONE SEEN NONE SEEN   Trich, Wet Prep NONE SEEN NONE SEEN   Clue Cells Wet Prep HPF POC NONE SEEN NONE SEEN   WBC, Wet Prep HPF POC MODERATE (A) NONE SEEN   Sperm NONE SEEN        Imaging: none  MAU Course/MDM: I have ordered labs as follows: Wet prep and UA Imaging ordered: none Results reviewed.   Wet prep negative but apparent UTI. WIll treat with Macrobid since she is allergic to Septra and I want to optimize compliance with treatment. Will give first dose here then d/c home. Reassured should be well treated in time for her surgery on Monday (scheduled for Cold knife cone)  Pt stable at time of discharge.  Assessment: Vaginal Discharge, normal wet prep Probable urinary tract infection  Plan: Discharge home Recommend Take meds as prescribed, push fluids Rx sent for Macrobid for UTI   Encouraged to return here or to other Urgent Care/ED if she develops worsening of symptoms, increase in pain, fever, or other concerning symptoms.   Hansel Feinstein CNM, MSN Certified Nurse-Midwife 06/26/2016 8:02 PM

## 2016-06-26 NOTE — MAU Note (Signed)
Urine sent to lab 

## 2016-06-26 NOTE — MAU Note (Signed)
Pt presents to MAU with a vaginal odor. Reports no discharge or urinary symptoms. Has a biopsy scheduled for Monday.

## 2016-06-27 NOTE — Consult Note (Signed)
Noted in pt's chart pt needed to be on contact precautions c each admission for extended spectrum based lactamase. Called ID RN Revonda Standard at [phone] and Marlowe Kays stated that pt DOES need to be on contact precautions for this "highly resistant to antibiotics" strain.  Chassisty S. RN made aware of this for Monday's surgery on 07/01/16.

## 2016-07-01 ENCOUNTER — Ambulatory Visit (HOSPITAL_COMMUNITY)
Admission: RE | Admit: 2016-07-01 | Discharge: 2016-07-01 | Disposition: A | Discharge status: Home / Self Care | Payer: Medicaid Other | Source: Ambulatory Visit | Attending: Obstetrics & Gynecology | Admitting: Obstetrics & Gynecology

## 2016-07-01 ENCOUNTER — Ambulatory Visit (HOSPITAL_COMMUNITY): Payer: Medicaid Other | Admitting: Anesthesiology

## 2016-07-01 ENCOUNTER — Encounter (HOSPITAL_COMMUNITY)
Admission: RE | Disposition: A | Discharge status: Home / Self Care | Payer: Self-pay | Source: Ambulatory Visit | Attending: Obstetrics & Gynecology

## 2016-07-01 ENCOUNTER — Encounter (HOSPITAL_COMMUNITY): Payer: Self-pay | Admitting: Anesthesiology

## 2016-07-01 DIAGNOSIS — Z6841 Body Mass Index (BMI) 40.0 and over, adult: Secondary | ICD-10-CM | POA: Diagnosis not present

## 2016-07-01 DIAGNOSIS — D069 Carcinoma in situ of cervix, unspecified: Secondary | ICD-10-CM | POA: Diagnosis not present

## 2016-07-01 DIAGNOSIS — R87613 High grade squamous intraepithelial lesion on cytologic smear of cervix (HGSIL): Secondary | ICD-10-CM

## 2016-07-01 HISTORY — PX: CERVICAL CONIZATION W/BX: SHX1330

## 2016-07-01 LAB — CBC
HCT: 44.3 % (ref 36.0–46.0)
Hemoglobin: 15.7 g/dL — ABNORMAL HIGH (ref 12.0–15.0)
MCH: 30.4 pg (ref 26.0–34.0)
MCHC: 35.4 g/dL (ref 30.0–36.0)
MCV: 85.9 fL (ref 78.0–100.0)
Platelets: 202 10*3/uL (ref 150–400)
RBC: 5.16 MIL/uL — ABNORMAL HIGH (ref 3.87–5.11)
RDW: 13.4 % (ref 11.5–15.5)
WBC: 6.3 10*3/uL (ref 4.0–10.5)

## 2016-07-01 SURGERY — CONE BIOPSY, CERVIX
Anesthesia: General | Site: Vagina

## 2016-07-01 MED ORDER — SCOPOLAMINE 1 MG/3DAYS TD PT72
MEDICATED_PATCH | TRANSDERMAL | Status: AC
  Administered 2016-07-01: 1.5 mg via TRANSDERMAL
  Filled 2016-07-01: qty 1

## 2016-07-01 MED ORDER — MIDAZOLAM HCL 5 MG/5ML IJ SOLN
INTRAMUSCULAR | Status: DC | PRN
  Administered 2016-07-01: 2 mg via INTRAVENOUS

## 2016-07-01 MED ORDER — DEXAMETHASONE SODIUM PHOSPHATE 10 MG/ML IJ SOLN
INTRAMUSCULAR | Status: AC
  Filled 2016-07-01: qty 1

## 2016-07-01 MED ORDER — ACETIC ACID 4% SOLUTION
Status: DC | PRN
  Administered 2016-07-01: 1 via TOPICAL

## 2016-07-01 MED ORDER — IODINE STRONG (LUGOLS) 5 % PO SOLN
ORAL | Status: DC | PRN
  Administered 2016-07-01: 0.1 mL

## 2016-07-01 MED ORDER — FENTANYL CITRATE (PF) 100 MCG/2ML IJ SOLN
25.0000 ug | INTRAMUSCULAR | Status: DC | PRN
  Administered 2016-07-01: 50 ug via INTRAVENOUS
  Administered 2016-07-01: 25 ug via INTRAVENOUS
  Administered 2016-07-01: 50 ug via INTRAVENOUS
  Administered 2016-07-01: 25 ug via INTRAVENOUS

## 2016-07-01 MED ORDER — IBUPROFEN 600 MG PO TABS: 600.0000 mg | ORAL_TABLET | Freq: Four times a day (QID) | ORAL | 0 refills | Status: DC | PRN

## 2016-07-01 MED ORDER — MEPERIDINE HCL 25 MG/ML IJ SOLN: 6.2500 mg | INTRAMUSCULAR | Status: DC | PRN

## 2016-07-01 MED ORDER — FENTANYL CITRATE (PF) 100 MCG/2ML IJ SOLN
INTRAMUSCULAR | Status: AC
  Administered 2016-07-01: 50 ug via INTRAVENOUS
  Filled 2016-07-01: qty 2

## 2016-07-01 MED ORDER — MIDAZOLAM HCL 2 MG/2ML IJ SOLN
INTRAMUSCULAR | Status: AC
  Filled 2016-07-01: qty 2

## 2016-07-01 MED ORDER — LACTATED RINGERS IV SOLN: INTRAVENOUS | Status: DC

## 2016-07-01 MED ORDER — OXYCODONE-ACETAMINOPHEN 5-325 MG PO TABS: 2.0000 | ORAL_TABLET | ORAL | 0 refills | Status: DC | PRN

## 2016-07-01 MED ORDER — KETOROLAC TROMETHAMINE 30 MG/ML IJ SOLN
INTRAMUSCULAR | Status: AC
  Filled 2016-07-01: qty 1

## 2016-07-01 MED ORDER — IODINE STRONG (LUGOLS) 5 % PO SOLN
ORAL | Status: AC
Start: 2016-07-01 — End: 2016-07-01
  Filled 2016-07-01: qty 1

## 2016-07-01 MED ORDER — PROPOFOL 10 MG/ML IV BOLUS
INTRAVENOUS | Status: DC | PRN
  Administered 2016-07-01: 200 mg via INTRAVENOUS

## 2016-07-01 MED ORDER — LIDOCAINE HCL (CARDIAC) 20 MG/ML IV SOLN
INTRAVENOUS | Status: DC | PRN
  Administered 2016-07-01: 100 mg via INTRAVENOUS

## 2016-07-01 MED ORDER — DEXTROSE-NACL 5-0.9 % IV SOLN: INTRAVENOUS | Status: DC

## 2016-07-01 MED ORDER — FENTANYL CITRATE (PF) 100 MCG/2ML IJ SOLN
INTRAMUSCULAR | Status: DC | PRN
  Administered 2016-07-01 (×2): 50 ug via INTRAVENOUS
  Administered 2016-07-01: 100 ug via INTRAVENOUS

## 2016-07-01 MED ORDER — FENTANYL CITRATE (PF) 100 MCG/2ML IJ SOLN
INTRAMUSCULAR | Status: AC
  Filled 2016-07-01: qty 2

## 2016-07-01 MED ORDER — DEXAMETHASONE SODIUM PHOSPHATE 4 MG/ML IJ SOLN
INTRAMUSCULAR | Status: DC | PRN
  Administered 2016-07-01: 10 mg via INTRAVENOUS

## 2016-07-01 MED ORDER — LACTATED RINGERS IV SOLN
INTRAVENOUS | Status: DC
  Administered 2016-07-01: 12:00:00 via INTRAVENOUS

## 2016-07-01 MED ORDER — FERRIC SUBSULFATE 259 MG/GM EX SOLN
CUTANEOUS | Status: AC
  Filled 2016-07-01: qty 8

## 2016-07-01 MED ORDER — SCOPOLAMINE 1 MG/3DAYS TD PT72
1.0000 | MEDICATED_PATCH | Freq: Once | TRANSDERMAL | Status: DC
  Administered 2016-07-01: 1.5 mg via TRANSDERMAL

## 2016-07-01 MED ORDER — METOCLOPRAMIDE HCL 5 MG/ML IJ SOLN: 10.0000 mg | Freq: Once | INTRAMUSCULAR | Status: DC | PRN

## 2016-07-01 MED ORDER — MONSELS FERRIC SUBSULFATE EX SOLN
CUTANEOUS | Status: DC | PRN
  Administered 2016-07-01: 1 via TOPICAL

## 2016-07-01 MED ORDER — LIDOCAINE HCL (CARDIAC) 20 MG/ML IV SOLN
INTRAVENOUS | Status: AC
  Filled 2016-07-01: qty 5

## 2016-07-01 MED ORDER — PROPOFOL 10 MG/ML IV BOLUS
INTRAVENOUS | Status: AC
Start: 2016-07-01 — End: 2016-07-01
  Filled 2016-07-01: qty 20

## 2016-07-01 MED ORDER — KETOROLAC TROMETHAMINE 30 MG/ML IJ SOLN
INTRAMUSCULAR | Status: DC | PRN
  Administered 2016-07-01: 30 mg via INTRAVENOUS

## 2016-07-01 MED ORDER — LIDOCAINE HCL 1 % IJ SOLN
INTRAMUSCULAR | Status: AC
  Filled 2016-07-01: qty 20

## 2016-07-01 MED ORDER — ACETIC ACID 5 % SOLN
Status: AC
  Filled 2016-07-01: qty 500

## 2016-07-01 MED ORDER — ONDANSETRON HCL 4 MG/2ML IJ SOLN
INTRAMUSCULAR | Status: AC
  Filled 2016-07-01: qty 2

## 2016-07-01 MED ORDER — ONDANSETRON HCL 4 MG/2ML IJ SOLN
INTRAMUSCULAR | Status: DC | PRN
  Administered 2016-07-01: 4 mg via INTRAVENOUS

## 2016-07-01 MED FILL — Ferric Subsulfate (Bulk) Soln: Qty: 100 | Status: AC

## 2016-07-01 SURGICAL SUPPLY — 30 items
BLADE SURG 11 STRL SS (BLADE) ×2 IMPLANT
CLOTH BEACON ORANGE TIMEOUT ST (SAFETY) ×2 IMPLANT
CONTAINER PREFILL 10% NBF 60ML (FORM) ×2 IMPLANT
COUNTER NEEDLE 1200 MAGNETIC (NEEDLE) ×2 IMPLANT
DILATOR CANAL MILEX (MISCELLANEOUS) IMPLANT
ELECT LLETZ BALL 5MM DISP (ELECTRODE) ×2 IMPLANT
ELECT REM PT RETURN 9FT ADLT (ELECTROSURGICAL) ×2
ELECTRODE REM PT RTRN 9FT ADLT (ELECTROSURGICAL) ×1 IMPLANT
GLOVE BIO SURGEON STRL SZ 6.5 (GLOVE) ×2 IMPLANT
GLOVE BIOGEL PI IND STRL 6 (GLOVE) ×1 IMPLANT
GLOVE BIOGEL PI IND STRL 7.0 (GLOVE) ×1 IMPLANT
GLOVE BIOGEL PI INDICATOR 6 (GLOVE) ×1
GLOVE BIOGEL PI INDICATOR 7.0 (GLOVE) ×1
GOWN STRL REUS W/TWL LRG LVL3 (GOWN DISPOSABLE) ×4 IMPLANT
NEEDLE SPNL 18GX3.5 QUINCKE PK (NEEDLE) ×2 IMPLANT
PACK VAGINAL MINOR WOMEN LF (CUSTOM PROCEDURE TRAY) ×2 IMPLANT
PAD OB MATERNITY 4.3X12.25 (PERSONAL CARE ITEMS) ×2 IMPLANT
PENCIL BUTTON HOLSTER BLD 10FT (ELECTRODE) ×2 IMPLANT
SCOPETTES 8  STERILE (MISCELLANEOUS) ×1
SCOPETTES 8 STERILE (MISCELLANEOUS) ×1 IMPLANT
SPONGE SURGIFOAM ABS GEL 12-7 (HEMOSTASIS) IMPLANT
SUT CHROMIC 0 CT 1 (SUTURE) IMPLANT
SUT VIC AB 0 CT1 18XCR BRD8 (SUTURE) ×1 IMPLANT
SUT VIC AB 0 CT1 27 (SUTURE)
SUT VIC AB 0 CT1 27XBRD ANBCTR (SUTURE) IMPLANT
SUT VIC AB 0 CT1 8-18 (SUTURE) ×1
TOWEL OR 17X24 6PK STRL BLUE (TOWEL DISPOSABLE) ×2 IMPLANT
TUBING NON-CON 1/4 X 20 CONN (TUBING) IMPLANT
WATER STERILE IRR 1000ML POUR (IV SOLUTION) ×2 IMPLANT
YANKAUER SUCT BULB TIP NO VENT (SUCTIONS) IMPLANT

## 2016-07-01 NOTE — Discharge Instructions (Signed)
Post Anesthesia Home Care Instructions  Activity: Get plenty of rest for the remainder of the day. A responsible adult should stay with you for 24 hours following the procedure.  For the next 24 hours, DO NOT: -Drive a car -Paediatric nurse -Drink alcoholic beverages -Take any medication unless instructed by your physician -Make any legal decisions or sign important papers.  Meals: Start with liquid foods such as gelatin or soup. Progress to regular foods as tolerated. Avoid greasy, spicy, heavy foods. If nausea and/or vomiting occur, drink only clear liquids until the nausea and/or vomiting subsides. Call your physician if vomiting continues.  Special Instructions/Symptoms: Your throat may feel dry or sore from the anesthesia or the breathing tube placed in your throat during surgery. If this causes discomfort, gargle with warm salt water. The discomfort should disappear within 24 hours.  If you had a scopolamine patch placed behind your ear for the management of post- operative nausea and/or vomiting:  1. The medication in the patch is effective for 72 hours, after which it should be removed.  Wrap patch in a tissue and discard in the trash. Wash hands thoroughly with soap and water. 2. You may remove the patch earlier than 72 hours if you experience unpleasant side effects which may include dry mouth, dizziness or visual disturbances. 3. Avoid touching the patch. Wash your hands with soap and water after contact with the patch.   Conization of the Cervix Cervical conization is the cutting (excision) of a cone-shaped portion of the cervix. The procedure is performed through the vagina in either your health care provider's office or an operating room. This procedure is usually done when there is abnormal bleeding from the cervix. It can also be done to evaluate an abnormal Pap test or if an abnormality is seen on the cervix during an exam. The tissue is then examined to see if there are  precancerous cells or cancer present.  Conization of the cervix is not done during a menstrual period or pregnancy.  LET [facility] CARE PROVIDER KNOW ABOUT:  Any allergies you have.   All medicines you are taking, including vitamins, herbs, eye drops, creams, and over-the-counter medicines.   Previous problems you or members of your family have had with the use of anesthetics.   Any blood disorders you have.   Previous surgeries you have had.   Medical conditions you have.   Your smoking habits.   The possibility of being pregnant.  RISKS AND COMPLICATIONS  Generally, conization of the cervix is a safe procedure. However, as with any procedure, complications can occur. Possible complications include:  Heavy bleeding several days or weeks after the procedure. Light bleeding or spotting after the procedure is normal.  Infection (rare).  Damage to the cervix or surrounding organs (uncommon).   Problems with the anesthesia.   Increased risk of preterm labor in future pregnancies. BEFORE THE PROCEDURE  Do not eat or drink anything for 6-8 hours before the procedure.   Do not take aspirin or blood thinners for at least a week before the procedure or as directed by your health care provider.   Arrange for someone to take you home after the procedure.  PROCEDURE There are three different methods to perform conization of the cervix. These include:   The cold knife method. In this method a small cone-shaped sample of tissue is cut out with a knife (scalpel) from the cervical canal and the transformation zone (where the normal cells end and the  abnormal cells begin).   The LEEP method. In this method a small cone-shaped sample of tissue is cut out with a thin wire that can burn (cauterize) the cervical tissue with an electrical current.   Laser treatment. In this method a small cone-shaped sample of tissue is cut out and then cauterized with a laser beam to prevent  bleeding.  The procedure will be performed as follows:   Depending on the method, you will either be given a medicine to make you sleep (general anesthetic) or a numbing medicine (local anesthetic). A medicine that numbs the cervix (cervical block) may be given.   A lubricated device called a speculum will be inserted into the vagina to spread open the walls of the vagina. This will help your health care provider see the inside of the vagina and cervix better.   The tissue from the cervix will be removed and examined.   The results of the procedure will help your health care provider decide if further treatment is necessary. They will also help your health care provider decide on the best treatment if your results are abnormal. AFTER THE PROCEDURE  If you had a general anesthetic, you may be groggy for 2-3 hours after the procedure.   If you had a local anesthetic, you will rest at the clinic or hospital until you are stable and feel ready to go home.   Recovery may take up to 3 weeks.   You may have some cramping for about 1 week.   You may have bloody discharge or light bleeding for 1-2 weeks.   You may have black discharge coming from the vagina. This is from the paste used on the cervix to prevent bleeding. This is normal discharge.    This information is not intended to replace advice given to you by your health care provider. Make sure you discuss any questions you have with your health care provider.   Document Released: 07/17/2005 Document Revised: 10/12/2013 Document Reviewed: 04/02/2013 Elsevier Interactive Patient Education [employer].

## 2016-07-01 NOTE — H&P (Signed)
Jordan [name] is an 50 y.o. female who presents for CKC due to inadequate colposcopy. Pt with known H/o abnormal pap smear in the pass, normal colpo, no follow up. Pap smear of July HGSIL, but inadequate colpo. Further evaluation recommended via CKC.   Pertinent Gynecological History: Menses: post-menopausal Contraception: none DES exposure: denies Blood transfusions: none Sexually transmitted diseases: no past history Previous GYN Procedures: colpo  Last mammogram: unknown Last pap: abnormal: HGSIL Date: July 2017   Menstrual History: Menarche age: 38 No LMP recorded (lmp unknown). Patient is postmenopausal.    Past Medical History:  Diagnosis Date  . Alcohol abuse   . Anemia   . Bipolar 1 disorder (HCC)    No medications currently  . Bipolar 1 disorder ([place])   . Bipolar disorder ([place])   . 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 Mental disorder   . Obesity   . SVD (spontaneous vaginal delivery)    x 1  . UTI (lower urinary tract infection)   . Yeast infection of the vagina     Past Surgical History:  Procedure Laterality Date  . CHOLECYSTECTOMY    . HYSTEROSCOPY W/D&C N/A 01/25/2016   Procedure: DILATATION AND CURETTAGE /HYSTEROSCOPY;  Surgeon: Mora Bellman, MD;  Location: [facility];  Service: Gynecology;  Laterality: N/A;    History reviewed. No pertinent family history.  Social History:  reports that she has never smoked. She has never used smokeless tobacco. She reports that she does not drink alcohol or use drugs.  Allergies:  Allergies  Allergen Reactions  . Citalopram Other (See Comments)    Possible cause of pancytopenia. Swelling of tongue, face and throat  . Lamotrigine Other (See Comments)    Possible cause of pancytopenia. Swelling of face, throat and tongue  . Sulfa Antibiotics Anaphylaxis  . Tramadol Anaphylaxis, Shortness Of Breath and Swelling    Prescriptions Prior to Admission  Medication Sig Dispense Refill Last Dose  .  acetaminophen (TYLENOL) 500 MG tablet Take 1,000 mg by mouth every 6 (six) hours as needed for moderate pain. Reported on 04/11/2016   Past Week at Unknown time  . nitrofurantoin, macrocrystal-monohydrate, (MACROBID) 100 MG capsule Take 1 capsule (100 mg total) by mouth 2 (two) times daily. 14 capsule 0 Past Week at Unknown time  . benzonatate (TESSALON) 100 MG capsule Take 1 capsule (100 mg total) by mouth every 8 (eight) hours. (Patient not taking: Reported on 03/09/2016) 15 capsule 0 Not Taking at Unknown time  . cyclobenzaprine (FLEXERIL) 10 MG tablet Take 1 tablet (10 mg total) by mouth 2 (two) times daily as needed for muscle spasms. (Patient not taking: Reported on 06/19/2016) 20 tablet 0 Not Taking at Unknown time  . dicyclomine (BENTYL) 20 MG tablet Take 1 tablet (20 mg total) by mouth 2 (two) times daily. (Patient not taking: Reported on 03/09/2016) 20 tablet 0 Not Taking at Unknown time  . fosfomycin (MONUROL) 3 g PACK Take 3 g by mouth once. (Patient not taking: Reported on 04/01/2016) 1 g 0 Not Taking at Unknown time  . loperamide (IMODIUM) 2 MG capsule Take 1 capsule (2 mg total) by mouth 4 (four) times daily as needed for diarrhea or loose stools. (Patient not taking: Reported on 03/09/2016) 12 capsule 0 Not Taking at Unknown time  . naproxen (NAPROSYN) 500 MG tablet Take 1 tablet (500 mg total) by mouth 2 (two) times daily with a meal. (Patient not taking: Reported on 06/19/2016) 30 tablet 0 Not Taking at  Unknown time    Review of Systems  Constitutional: Negative.   Respiratory: Negative.   Cardiovascular: Negative.   Gastrointestinal: Negative.     Blood pressure (!) 141/93, pulse 95, temperature 98.1 F (36.7 C), temperature source Oral, resp. rate 20, height 5\' 8"  (1.727 m), weight (!) 163.3 kg (360 lb), SpO2 99 %. Physical Exam  Constitutional: She appears well-developed and well-nourished.  Cardiovascular: Normal rate and regular rhythm.   Respiratory: Effort normal and breath  sounds normal.  GI: Soft. Bowel sounds are normal.    No results found for this or any previous visit (from the past 24 hour(s)).  No results found.  Assessment/Plan: Abnormal pap smear  CKC reviewed with pt and boyfriend. R/B and post op care reviewed. Pt verbalized understanding and agrees to proceed.   Chancy Milroy 07/01/2016, 12:18 PM

## 2016-07-01 NOTE — Anesthesia Procedure Notes (Signed)
Procedure Name: LMA Insertion Date/Time: 07/01/2016 1:07 PM Performed by: Riki Sheer Pre-anesthesia Checklist: Patient identified, Emergency Drugs available, Suction available, Timeout performed and Patient being monitored Patient Re-evaluated:Patient Re-evaluated prior to inductionOxygen Delivery Method: Circle system utilized Preoxygenation: Pre-oxygenation with 100% oxygen Intubation Type: IV induction Ventilation: Mask ventilation without difficulty LMA: LMA inserted LMA Size: 4.0 Number of attempts: 1 Placement Confirmation: positive ETCO2,  CO2 detector and breath sounds checked- equal and bilateral Tube secured with: Tape Dental Injury: Teeth and Oropharynx as per pre-operative assessment

## 2016-07-01 NOTE — Anesthesia Preprocedure Evaluation (Addendum)
Anesthesia Evaluation  Patient identified by MRN, date of birth, ID band Patient awake    Reviewed: Allergy & Precautions, NPO status , Patient's Chart, lab work & pertinent test results  Airway Mallampati: II  TM Distance: >3 FB Neck ROM: Full    Dental no notable dental hx.    Pulmonary neg pulmonary ROS,    Pulmonary exam normal breath sounds clear to auscultation       Cardiovascular negative cardio ROS Normal cardiovascular exam Rhythm:Regular Rate:Normal     Neuro/Psych PSYCHIATRIC DISORDERS Bipolar Disorder negative neurological ROS     GI/Hepatic negative GI ROS, Neg liver ROS,   Endo/Other  Morbid obesity  Renal/GU negative Renal ROS  negative genitourinary   Musculoskeletal negative musculoskeletal ROS (+)   Abdominal   Peds negative pediatric ROS (+)  Hematology negative hematology ROS (+)   Anesthesia Other Findings   Reproductive/Obstetrics negative OB ROS                            Anesthesia Physical Anesthesia Plan  ASA: III  Anesthesia Plan: General   Post-op Pain Management:    Induction: Intravenous  Airway Management Planned: LMA  Additional Equipment:   Intra-op Plan:   Post-operative Plan: Extubation in OR  Informed Consent: I have reviewed the patients History and Physical, chart, labs and discussed the procedure including the risks, benefits and alternatives for the proposed anesthesia with the patient or authorized representative who has indicated his/her understanding and acceptance.   Dental advisory given  Plan Discussed with: CRNA  Anesthesia Plan Comments:         Anesthesia Quick Evaluation

## 2016-07-01 NOTE — Transfer of Care (Signed)
Immediate Anesthesia Transfer of Care Note  Patient: Jordan [name]  Procedure(s) Performed: Procedure(s): CONIZATION CERVIX WITH BIOPSY (N/A)  Patient Location: PACU  Anesthesia Type:General  Level of Consciousness: awake, alert  and oriented  Airway & Oxygen Therapy: Patient Spontanous Breathing and Patient connected to nasal cannula oxygen  Post-op Assessment: Report given to RN and Post -op Vital signs reviewed and stable  Post vital signs: Reviewed and stable  Last Vitals:  Vitals:   07/01/16 1140  BP: (!) 141/93  Pulse: 95  Resp: 20  Temp: 36.7 C    Last Pain:  Vitals:   07/01/16 1140  TempSrc: Oral      Patients Stated Pain Goal: 3 ([id number])  Complications: No apparent anesthesia complications

## 2016-07-01 NOTE — Op Note (Signed)
Preoperative diagnosis: Abnormal pap smear  Postoperative diagnosis: Same  Procedure: Cervical conization  Surgeon:  Chancy Milroy, M.D.  Anesthesia: GETA - Reginal Lutes, MD  Findings: Abnormal up take of Lugol's at 8 o'clock  Estimated blood loss: 50 cc  Specimens: Cervical conization  Reason for procedure: Jordan [name] [id number] HGSIL on pap smear this past July. Colposcopy Bx return as normal. Due to descriptive further eval with CKC recommended  Procedure: Patient was taken to the operating room where  analgesia was administered.She was prepped and draped in the usual sterile fashion. A timeout was performed. The patient had SCDs in place. The patient was in dorsal lithotomy.  Deaver's were placed exposing the cervix. Anterior lip of the cervix was grasped with single tooth tenaculum. Uterus was sound to 9 cm. Acetic acid and Lugol's was applied and above findings were noted.  A 0 Vicryl suture on a CT-1 was used to put stay sutures in cervix from 10-8 and from 2-4 o'clock.  An 11 blade was used to  Stab incisions at 12,3, 6 and 9 o'clock positions. Incisions were then connected. Specimen was removed unfortunately in pieces. ECC was obtained. Conization bed was the cauterized with Bovie. Site was then closed with 0 Vicrly in a Sturmdorf fashion. Interrupted sutures of the same were place at the 3 and 9 o'clock positions. take a 3-4 cm conization of cervix. Hemostasis was noted. Monsel's solution was applied. All sutures were cut.  All instrument, needle and lap counts were correct x 2. The patient was taken to recovery in stable condition.  Jordan L. Rip Harbour, MD

## 2016-07-01 NOTE — Anesthesia Postprocedure Evaluation (Signed)
Anesthesia Post Note  Patient: Jordan [name]  Procedure(s) Performed: Procedure(s) (LRB): CONIZATION CERVIX WITH BIOPSY (N/A)  Patient location during evaluation: PACU Anesthesia Type: General Level of consciousness: awake and alert Pain management: pain level controlled Vital Signs Assessment: post-procedure vital signs reviewed and stable Respiratory status: spontaneous breathing, nonlabored ventilation, respiratory function stable and patient connected to nasal cannula oxygen Cardiovascular status: blood pressure returned to baseline and stable Postop Assessment: no signs of nausea or vomiting Anesthetic complications: no     Last Vitals:  Vitals:   07/01/16 1430 07/01/16 1437  BP: (!) 141/93   Pulse: 78 83  Resp: 16 (!) 22  Temp:      Last Pain:  Vitals:   07/01/16 1437  TempSrc:   PainSc: 9    Pain Goal: Patients Stated Pain Goal: 0 (07/01/16 1408)               Reginal Lutes

## 2016-07-02 ENCOUNTER — Encounter (HOSPITAL_COMMUNITY): Payer: Self-pay | Admitting: Obstetrics and Gynecology

## 2016-07-03 ENCOUNTER — Encounter (HOSPITAL_COMMUNITY): Payer: Self-pay | Admitting: *Deleted

## 2016-07-03 ENCOUNTER — Inpatient Hospital Stay (HOSPITAL_COMMUNITY)
Admission: AD | Admit: 2016-07-03 | Discharge: 2016-07-03 | Disposition: A | Discharge status: Home / Self Care | Payer: Medicaid Other | Source: Ambulatory Visit | Attending: Obstetrics and Gynecology | Admitting: Obstetrics and Gynecology

## 2016-07-03 DIAGNOSIS — F319 Bipolar disorder, unspecified: Secondary | ICD-10-CM | POA: Insufficient documentation

## 2016-07-03 DIAGNOSIS — N9089 Other specified noninflammatory disorders of vulva and perineum: Secondary | ICD-10-CM

## 2016-07-03 DIAGNOSIS — L293 Anogenital pruritus, unspecified: Secondary | ICD-10-CM | POA: Diagnosis present

## 2016-07-03 DIAGNOSIS — D069 Carcinoma in situ of cervix, unspecified: Secondary | ICD-10-CM | POA: Insufficient documentation

## 2016-07-03 DIAGNOSIS — N898 Other specified noninflammatory disorders of vagina: Secondary | ICD-10-CM | POA: Insufficient documentation

## 2016-07-03 HISTORY — DX: Unspecified abnormal cytological findings in specimens from vagina: R87.629

## 2016-07-03 LAB — URINE MICROSCOPIC-ADD ON

## 2016-07-03 LAB — URINALYSIS, ROUTINE W REFLEX MICROSCOPIC
Bilirubin Urine: NEGATIVE
Glucose, UA: NEGATIVE mg/dL
Ketones, ur: NEGATIVE mg/dL
Nitrite: POSITIVE — AB
Protein, ur: NEGATIVE mg/dL
Specific Gravity, Urine: 1.025 (ref 1.005–1.030)
pH: 6 (ref 5.0–8.0)

## 2016-07-03 MED ORDER — MICONAZOLE NITRATE 2 % EX CREA: 1.0000 "application " | TOPICAL_CREAM | Freq: Two times a day (BID) | CUTANEOUS | 0 refills | Status: DC

## 2016-07-03 NOTE — MAU Note (Signed)
Pt states she had outpatient surgery on Monday to see if she has cervical cancer. Pt states since then she has had bad vaginal pain, itching, and vaginal burning.Pt denies vaginal bleeding. Pt states she has vaginal odor. Pt denies vaginal discharge.

## 2016-07-03 NOTE — MAU Provider Note (Addendum)
History     CSN: [id number]  Arrival date and time: 07/03/16 1745   None     Chief Complaint  Patient presents with  . vaginal burning  . Vaginal Itching  . Vaginal Pain   Vaginal Itching  Pertinent negatives include no chills, fever, nausea or vomiting.  Vaginal Pain  Pertinent negatives include no chills, fever, nausea or vomiting.   50 yr female well known to this facility,with a history of frequent gyn concerns, now 2 days s/p CKC presents complaining of not feeling well down there, irritated. 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 Of note patient had pap in 2013,suggesting a high grade lesion, then 2017 with no interval followup documented in EPIC.    Past Medical History:  Diagnosis Date  . Alcohol abuse   . Anemia   . Bipolar 1 disorder (HCC)    No medications currently  . 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 Mental disorder   . Obesity   . SVD (spontaneous vaginal delivery)    x 1  . UTI (lower urinary tract infection)   . Vaginal Pap smear, abnormal   . Yeast infection of the vagina     Past Surgical History:  Procedure Laterality Date  . CERVICAL CONIZATION W/BX N/A 07/01/2016   Procedure: CONIZATION CERVIX WITH BIOPSY;  Surgeon: Chancy Milroy, MD;  Location: [facility];  Service: Gynecology;  Laterality: N/A;  . CHOLECYSTECTOMY    . HYSTEROSCOPY W/D&C N/A 01/25/2016   Procedure: DILATATION AND CURETTAGE /HYSTEROSCOPY;  Surgeon: Mora Bellman, MD;  Location: [facility];  Service: Gynecology;  Laterality: N/A;    History reviewed. No pertinent family history.  Social History  Substance Use Topics  . Smoking status: Never Smoker  . Smokeless tobacco: Never Used  . Alcohol use No    Allergies:  Allergies  Allergen Reactions  . Citalopram Other (See Comments)    Possible cause of pancytopenia. Swelling of tongue, face and throat  . Lamotrigine Other (See  Comments)    Possible cause of pancytopenia. Swelling of face, throat and tongue  . Sulfa Antibiotics Anaphylaxis  . Tramadol Anaphylaxis, Shortness Of Breath and Swelling    Prescriptions Prior to Admission  Medication Sig Dispense Refill Last Dose  . acetaminophen (TYLENOL) 500 MG tablet Take 1,000 mg by mouth every 6 (six) hours as needed for moderate pain. Reported on 04/11/2016   Past Week at Unknown time  . benzonatate (TESSALON) 100 MG capsule Take 1 capsule (100 mg total) by mouth every 8 (eight) hours. (Patient not taking: Reported on 03/09/2016) 15 capsule 0 Not Taking at Unknown time  . cyclobenzaprine (FLEXERIL) 10 MG tablet Take 1 tablet (10 mg total) by mouth 2 (two) times daily as needed for muscle spasms. (Patient not taking: Reported on 06/19/2016) 20 tablet 0 Not Taking at Unknown time  . dicyclomine (BENTYL) 20 MG tablet Take 1 tablet (20 mg total) by mouth 2 (two) times daily. (Patient not taking: Reported on 03/09/2016) 20 tablet 0 Not Taking at Unknown time  . fosfomycin (MONUROL) 3 g PACK Take 3 g by mouth once. (Patient not taking: Reported on 04/01/2016) 1 g 0 Not Taking at Unknown time  . ibuprofen (ADVIL,MOTRIN) 600 MG tablet Take 1 tablet (600 mg total) by mouth every 6 (six) hours as needed for moderate pain. 30 tablet 0   . loperamide (IMODIUM) 2 MG capsule Take 1 capsule (2 mg total) by mouth  4 (four) times daily as needed for diarrhea or loose stools. (Patient not taking: Reported on 03/09/2016) 12 capsule 0 Not Taking at Unknown time  . naproxen (NAPROSYN) 500 MG tablet Take 1 tablet (500 mg total) by mouth 2 (two) times daily with a meal. (Patient not taking: Reported on 06/19/2016) 30 tablet 0 Not Taking at Unknown time  . nitrofurantoin, macrocrystal-monohydrate, (MACROBID) 100 MG capsule Take 1 capsule (100 mg total) by mouth 2 (two) times daily. 14 capsule 0 Past Week at Unknown time  . oxyCODONE-acetaminophen (PERCOCET/ROXICET) 5-325 MG tablet Take 2 tablets by mouth  every 4 (four) hours as needed for severe pain. 15 tablet 0     Review of Systems  Constitutional: Negative for chills and fever.  Gastrointestinal: Negative for heartburn, nausea and vomiting.  Genitourinary: Positive for vaginal pain.  Psychiatric/Behavioral: The patient is nervous/anxious.    Physical Exam   Blood pressure 105/67, pulse 86, temperature 98.2 F (36.8 C), temperature source Oral, resp. rate 16, height 5\' 8"  (1.727 m), weight (!) 163.3 kg (360 lb), SpO2 95 %.  Physical Exam  Constitutional:  Morbidly obese  HENT:  Head: Normocephalic.  Eyes: Pupils are equal, round, and reactive to light.  Respiratory: Effort normal.  GI: Soft.  Exam limited by obesity  Genitourinary: Vagina normal. Cervix exhibits discharge.    evidence of recent conization , no active bleeding . Monsels still present and breaking down , most of monsels able to be removed with swabs. Vulvar discomfort from irritation by Monsels. 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  Plan increased vulvar washing to remove Monsels/discharge as it breaks down 2.Monistat to protect vulvar skin 3. Lengthy effort made to explain CIS dx to patient, who had many questions , answered as well as possible. 4 weight loss importance emphasized. Rx    Maelle Sheaffer V 07/03/2016, 7:29 PM

## 2016-07-03 NOTE — Discharge Instructions (Signed)

## 2016-07-04 ENCOUNTER — Other Ambulatory Visit: Payer: Self-pay | Admitting: Student

## 2016-07-04 ENCOUNTER — Telehealth: Payer: Self-pay

## 2016-07-04 DIAGNOSIS — R102 Pelvic and perineal pain: Secondary | ICD-10-CM

## 2016-07-04 NOTE — Telephone Encounter (Signed)
Patient called stating she was in a lot of pain since having surgery a couple days ago. Patient was seen in the ER and was giving monistat 7 for yeast. Patient stated she can not afford the medication and would have to wait until tomorrow to pick it up due to money. I advised patient to make sure her vagina was clean and to wear cotton panties if possible to help with wetness. Patient voice understanding at this time.

## 2016-07-06 LAB — URINE CULTURE: Culture: >=100000 — AB

## 2016-07-11 ENCOUNTER — Encounter (HOSPITAL_COMMUNITY): Payer: Self-pay | Admitting: *Deleted

## 2016-07-11 ENCOUNTER — Inpatient Hospital Stay (HOSPITAL_COMMUNITY)
Admission: AD | Admit: 2016-07-11 | Discharge: 2016-07-11 | Disposition: A | Discharge status: Home / Self Care | Payer: Medicaid Other | Source: Ambulatory Visit | Attending: Obstetrics and Gynecology | Admitting: Obstetrics and Gynecology

## 2016-07-11 DIAGNOSIS — N760 Acute vaginitis: Secondary | ICD-10-CM | POA: Insufficient documentation

## 2016-07-11 DIAGNOSIS — B9689 Other specified bacterial agents as the cause of diseases classified elsewhere: Secondary | ICD-10-CM | POA: Diagnosis not present

## 2016-07-11 DIAGNOSIS — N898 Other specified noninflammatory disorders of vagina: Secondary | ICD-10-CM | POA: Diagnosis present

## 2016-07-11 LAB — URINALYSIS, ROUTINE W REFLEX MICROSCOPIC
Bilirubin Urine: NEGATIVE
Glucose, UA: NEGATIVE mg/dL
Ketones, ur: NEGATIVE mg/dL
Nitrite: POSITIVE — AB
Protein, ur: NEGATIVE mg/dL
Specific Gravity, Urine: 1.025 (ref 1.005–1.030)
pH: 5.5 (ref 5.0–8.0)

## 2016-07-11 LAB — WET PREP, GENITAL
Clue Cells Wet Prep HPF POC: NONE SEEN
Sperm: NONE SEEN
Trich, Wet Prep: NONE SEEN
Yeast Wet Prep HPF POC: NONE SEEN

## 2016-07-11 LAB — URINE MICROSCOPIC-ADD ON

## 2016-07-11 LAB — POCT PREGNANCY, URINE: Preg Test, Ur: NEGATIVE

## 2016-07-11 MED ORDER — DOXYCYCLINE HYCLATE 100 MG PO CAPS: 100.0000 mg | ORAL_CAPSULE | Freq: Two times a day (BID) | ORAL | 0 refills | Status: DC

## 2016-07-11 NOTE — MAU Note (Signed)
Pt c/o vaginal odor x 3 months. Had a conization on 07/01/16. Still has odor . Stated she has tried to call office for appointment and they finally told her to come to MAU. Vag bleeding stopped ;ast week but stillc/o odor.

## 2016-07-11 NOTE — Discharge Instructions (Signed)
Bacterial Vaginosis °Bacterial vaginosis is a vaginal infection that occurs when the normal balance of bacteria in the vagina is disrupted. It results from an overgrowth of certain bacteria. This is the most common vaginal infection in women of childbearing age. Treatment is important to prevent complications, especially in pregnant women, as it can cause a premature delivery. °CAUSES  °Bacterial vaginosis is caused by an increase in harmful bacteria that are normally present in smaller amounts in the vagina. Several different kinds of bacteria can cause bacterial vaginosis. However, the reason that the condition develops is not fully understood. °RISK FACTORS °Certain activities or behaviors can put you at an increased risk of developing bacterial vaginosis, including: °· Having a new sex partner or multiple sex partners. °· Douching. °· Using an intrauterine device (IUD) for contraception. °Women do not get bacterial vaginosis from toilet seats, bedding, swimming pools, or contact with objects around them. °SIGNS AND SYMPTOMS  °Some women with bacterial vaginosis have no signs or symptoms. Common symptoms include: °· Grey vaginal discharge. °· A fishlike odor with discharge, especially after sexual intercourse. °· Itching or burning of the vagina and vulva. °· Burning or pain with urination. °DIAGNOSIS  °Your health care provider will take a medical history and examine the vagina for signs of bacterial vaginosis. A sample of vaginal fluid may be taken. Your health care provider will look at this sample under a microscope to check for bacteria and abnormal cells. A vaginal pH test may also be done.  °TREATMENT  °Bacterial vaginosis may be treated with antibiotic medicines. These may be given in the form of a pill or a vaginal cream. A second round of antibiotics may be prescribed if the condition comes back after treatment. Because bacterial vaginosis increases your risk for sexually transmitted diseases, getting  treated can help reduce your risk for chlamydia, gonorrhea, HIV, and herpes. °HOME CARE INSTRUCTIONS  °· Only take over-the-counter or prescription medicines as directed by your health care provider. °· If antibiotic medicine was prescribed, take it as directed. Make sure you finish it even if you start to feel better. °· Tell all sexual partners that you have a vaginal infection. They should see their health care provider and be treated if they have problems, such as a mild rash or itching. °· During treatment, it is important that you follow these instructions: °¨ Avoid sexual activity or use condoms correctly. °¨ Do not douche. °¨ Avoid alcohol as directed by your health care provider. °¨ Avoid breastfeeding as directed by your health care provider. °SEEK MEDICAL CARE IF:  °· Your symptoms are not improving after 3 days of treatment. °· You have increased discharge or pain. °· You have a fever. °MAKE SURE YOU:  °· Understand these instructions. °· Will watch your condition. °· Will get help right away if you are not doing well or get worse. °FOR MORE INFORMATION  °Centers for Disease Control and Prevention, Division of STD Prevention: www.cdc.gov/std °American Sexual Health Association (ASHA): www.ashastd.org  °  °This information is not intended to replace advice given to you by your health care provider. Make sure you discuss any questions you have with your health care provider. °  °Document Released: 10/07/2005 Document Revised: 10/28/2014 Document Reviewed: 05/19/2013 °Elsevier Interactive Patient Education ©2016 Elsevier Inc. ° °

## 2016-07-11 NOTE — MAU Provider Note (Signed)
History     CSN: [id number]  Arrival date and time: 07/11/16 1720   None     Chief Complaint  Patient presents with  . Vaginitis   HPI: Jordan [name] presents with c/o vaginal discharge with odor. She had a CKC on 07/01/16.     Past Medical History:  Diagnosis Date  . Alcohol abuse   . Anemia   . Bipolar 1 disorder (HCC)    No medications currently  . 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 Mental disorder   . Obesity   . SVD (spontaneous vaginal delivery)    x 1  . UTI (lower urinary tract infection)   . Vaginal Pap smear, abnormal   . Yeast infection of the vagina     Past Surgical History:  Procedure Laterality Date  . CERVICAL CONIZATION W/BX N/A 07/01/2016   Procedure: CONIZATION CERVIX WITH BIOPSY;  Surgeon: Chancy Milroy, MD;  Location: [facility];  Service: Gynecology;  Laterality: N/A;  . CHOLECYSTECTOMY    . HYSTEROSCOPY W/D&C N/A 01/25/2016   Procedure: DILATATION AND CURETTAGE /HYSTEROSCOPY;  Surgeon: Mora Bellman, MD;  Location: [facility];  Service: Gynecology;  Laterality: N/A;    History reviewed. No pertinent family history.  Social History  Substance Use Topics  . Smoking status: Never Smoker  . Smokeless tobacco: Never Used  . Alcohol use No    Allergies:  Allergies  Allergen Reactions  . Citalopram Other (See Comments)    Possible cause of pancytopenia. Swelling of tongue, face and throat  . Lamotrigine Other (See Comments)    Possible cause of pancytopenia. Swelling of face, throat and tongue  . Sulfa Antibiotics Anaphylaxis  . Tramadol Anaphylaxis, Shortness Of Breath and Swelling    Prescriptions Prior to Admission  Medication Sig Dispense Refill Last Dose  . acetaminophen (TYLENOL) 500 MG tablet Take 1,000 mg by mouth every 6 (six) hours as needed for moderate pain. Reported on 04/11/2016   Past Week at Unknown time  . benzonatate (TESSALON) 100 MG capsule Take 1 capsule (100 mg total) by mouth every 8 (eight) hours.  (Patient not taking: Reported on 03/09/2016) 15 capsule 0 Not Taking at Unknown time  . cyclobenzaprine (FLEXERIL) 10 MG tablet Take 1 tablet (10 mg total) by mouth 2 (two) times daily as needed for muscle spasms. (Patient not taking: Reported on 06/19/2016) 20 tablet 0 Not Taking at Unknown time  . dicyclomine (BENTYL) 20 MG tablet Take 1 tablet (20 mg total) by mouth 2 (two) times daily. (Patient not taking: Reported on 03/09/2016) 20 tablet 0 Not Taking at Unknown time  . fosfomycin (MONUROL) 3 g PACK Take 3 g by mouth once. (Patient not taking: Reported on 04/01/2016) 1 g 0 Not Taking at Unknown time  . ibuprofen (ADVIL,MOTRIN) 600 MG tablet Take 1 tablet (600 mg total) by mouth every 6 (six) hours as needed for moderate pain. (Patient not taking: Reported on 07/03/2016) 30 tablet 0 Not Taking at Unknown time  . loperamide (IMODIUM) 2 MG capsule Take 1 capsule (2 mg total) by mouth 4 (four) times daily as needed for diarrhea or loose stools. (Patient not taking: Reported on 03/09/2016) 12 capsule 0 Not Taking at Unknown time  . miconazole (MICOTIN) 2 % cream Apply 1 application topically 2 (two) times daily. May apply to external vaginal tissues (Patient not taking: Reported on 07/11/2016) 28.35 g 0 Not Taking at Unknown time  . naproxen (NAPROSYN) 500 MG tablet Take 1 tablet (500  mg total) by mouth 2 (two) times daily with a meal. (Patient not taking: Reported on 06/19/2016) 30 tablet 0 Not Taking at Unknown time  . nitrofurantoin, macrocrystal-monohydrate, (MACROBID) 100 MG capsule Take 1 capsule (100 mg total) by mouth 2 (two) times daily. (Patient not taking: Reported on 07/03/2016) 14 capsule 0 Not Taking at Unknown time    ROS Physical Exam   Blood pressure 127/82, pulse 83, temperature 98.2 F (36.8 C), temperature source Oral, resp. rate 18.  Physical Exam  Cardiovascular: Normal rate and regular rhythm.   Respiratory: Effort normal and breath sounds normal.  Genitourinary:  Genitourinary  Comments: Cervix healing well, some yellowish d/c and spotting noted    MAU Course  Procedures Wet prep and GC/C obtained Wet prep + for WBC's o/w negative    Assessment and Plan  Vaginitis Post OP CKC  Chancy Milroy 07/11/2016, 7:43 PM

## 2016-07-12 ENCOUNTER — Telehealth: Payer: Self-pay | Admitting: *Deleted

## 2016-07-12 NOTE — Telephone Encounter (Signed)
Jordan [name] called 07/11/16 afternoon and left a message she has had a vaginal odor for the last 3 months and wants to know what to do.    Per chart review she came into MAU 07/11/16 p.m  For vaginal odor.

## 2016-07-15 LAB — GC/CHLAMYDIA PROBE AMP (~~LOC~~) NOT AT ARMC
Chlamydia: NEGATIVE
Neisseria Gonorrhea: NEGATIVE

## 2016-07-21 ENCOUNTER — Emergency Department (HOSPITAL_COMMUNITY)
Admission: EM | Admit: 2016-07-21 | Discharge: 2016-07-21 | Disposition: A | Discharge status: Home / Self Care | Payer: Medicaid Other | Attending: Emergency Medicine | Admitting: Emergency Medicine

## 2016-07-21 DIAGNOSIS — N3000 Acute cystitis without hematuria: Secondary | ICD-10-CM | POA: Diagnosis not present

## 2016-07-21 DIAGNOSIS — M545 Low back pain, unspecified: Secondary | ICD-10-CM

## 2016-07-21 LAB — URINE MICROSCOPIC-ADD ON

## 2016-07-21 LAB — URINALYSIS, ROUTINE W REFLEX MICROSCOPIC
Bilirubin Urine: NEGATIVE
Glucose, UA: NEGATIVE mg/dL
Hgb urine dipstick: NEGATIVE
Ketones, ur: NEGATIVE mg/dL
Nitrite: POSITIVE — AB
Protein, ur: NEGATIVE mg/dL
Specific Gravity, Urine: 1.024 (ref 1.005–1.030)
pH: 5.5 (ref 5.0–8.0)

## 2016-07-21 LAB — PREGNANCY, URINE: Preg Test, Ur: NEGATIVE

## 2016-07-21 MED ORDER — CYCLOBENZAPRINE HCL 10 MG PO TABS: 10.0000 mg | ORAL_TABLET | Freq: Two times a day (BID) | ORAL | 0 refills | Status: DC | PRN

## 2016-07-21 MED ORDER — CIPROFLOXACIN HCL 500 MG PO TABS: 500.0000 mg | ORAL_TABLET | Freq: Two times a day (BID) | ORAL | 0 refills | Status: DC

## 2016-07-21 NOTE — Discharge Instructions (Signed)
Flexeril as prescribed as needed for your low back pain.  Cipro as prescribed.  Please follow-up with your GYN doctor for further evaluation.

## 2016-07-21 NOTE — ED Triage Notes (Signed)
Pt dissatisfied with Dr Estanislado Pandy assessment. Manuela Schwartz RN and Probation officer went to speak with pt and tried to explain to pt the reason she would benefit from following up with GYN. She has had this complaint for 3 months with multiple exams and meds, verified by pt. She feels every time she comes in the EDP/PA should provide another pelvic exam. It has been explained we are starting with testing her urine 1st then proceed from there. She is denying understanding after explained by Charlean Sanfilippo and Probation officer.

## 2016-07-21 NOTE — ED Notes (Signed)
Bed: [id number] Expected date:  Expected time:  Means of arrival:  Comments: EMS 50 yo female chronic back pain x 3 months

## 2016-07-21 NOTE — ED Provider Notes (Signed)
[facility] DEPT Provider Note   CSN: [id number] Arrival date & time: 07/21/16  [id number]     History   Chief Complaint Chief Complaint  Patient presents with  . Back Pain    HPI Jordan [name] is a 50 y.o. female.  Patient is a 50 year old female with history of bipolar, obesity, and recent cervical biopsy. She presents with complaints of a 3 month history of low back pain and "vaginal odor". She denies any injury or trauma. She denies any radiation into her legs. She denies any bowel or bladder complaints. Her back pain is worse with bending, walking, and palpation.  She has a [facility] of [facility] She has been seen there 3 times with these same vaginal complaints in the past month. She has had 3 pelvic examinations along with multiple tests performed, however the medication she has been given have not helped. She tells me her symptoms have been present for the past 3 months and presents here today for evaluation of this.      Past Medical History:  Diagnosis Date  . Alcohol abuse   . Anemia   . Bipolar 1 disorder (HCC)    No medications currently  . 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 Mental disorder   . Obesity   . SVD (spontaneous vaginal delivery)    x 1  . UTI (lower urinary tract infection)   . Vaginal Pap smear, abnormal   . Yeast infection of the vagina     Patient Active Problem List   Diagnosis Date Noted  . High grade squamous intraepithelial cervical dysplasia 05/24/2016  . HSIL (high grade squamous intraepithelial lesion) on Pap smear of cervix 04/15/2016  . BV (bacterial vaginosis) 03/27/2016  . UTI (lower urinary tract infection) 01/31/2016  . DUB (dysfunctional uterine bleeding)   . Diastolic dysfunction [id number]  . Cellulitis 05/17/2015  . PICC line infection 05/17/2015  . Constipation 05/12/2015  . ESBL (extended spectrum beta-lactamase) producing bacteria infection 03/17/2015  .  CAP (community acquired pneumonia) 03/17/2015  . Pneumonia 03/03/2015  . Diarrhea 03/03/2015  . Weakness 03/02/2015  . 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 Generalized anxiety disorder 02/20/2015  . Claustrophobia 02/20/2015  . Sepsis due to Gram negative bacteria (MDR E Coli) 02/18/2015  . Transaminitis 02/18/2015  . Chest pain 02/18/2015  . Sepsis ([place]) 02/18/2015  . Abdominal pain   . Dizziness   . Hypotension 01/04/2015  . Bipolar disorder ([place]) 06/29/2012  . LGSIL (low grade squamous intraepithelial lesion) on Pap smear 06/29/2012    Past Surgical History:  Procedure Laterality Date  . CERVICAL CONIZATION W/BX N/A 07/01/2016   Procedure: CONIZATION CERVIX WITH BIOPSY;  Surgeon: Chancy Milroy, MD;  Location: [facility];  Service: Gynecology;  Laterality: N/A;  . CHOLECYSTECTOMY    . HYSTEROSCOPY W/D&C N/A 01/25/2016   Procedure: DILATATION AND CURETTAGE /HYSTEROSCOPY;  Surgeon: Mora Bellman, MD;  Location: [facility];  Service: Gynecology;  Laterality: N/A;    OB History    Gravida Para Term Preterm AB Living   1 1 1     1    SAB TAB Ectopic Multiple Live Births           1       Home Medications    Prior to Admission medications   Medication Sig Start Date End Date Taking? Authorizing Provider  acetaminophen (TYLENOL) 500 MG tablet Take 1,000 mg by mouth every 6 (six) hours as needed for  moderate pain. Reported on 04/11/2016    Historical Provider, MD  doxycycline (VIBRAMYCIN) 100 MG capsule Take 1 capsule (100 mg total) by mouth 2 (two) times daily. 07/11/16   Chancy Milroy, MD  miconazole (MICOTIN) 2 % cream Apply 1 application topically 2 (two) times daily. May apply to external vaginal tissues Patient not taking: Reported on 07/11/2016 07/03/16   Jonnie Kind, MD    Family History No family history on file.  Social History Social History  Substance Use Topics  . Smoking status: Never Smoker  . Smokeless tobacco: Never Used  . Alcohol use No     Allergies     Citalopram; Lamotrigine; Sulfa antibiotics; and Tramadol   Review of Systems Review of Systems  All other systems reviewed and are negative.    Physical Exam Updated Vital Signs BP 154/77 (BP Location: Left Arm)   Pulse 91   Temp 98.1 F (36.7 C) (Oral)   Resp 18   LMP  (LMP Unknown)   SpO2 100%   Physical Exam  Constitutional: She is oriented to person, place, and time. She appears well-developed and well-nourished. No distress.  HENT:  Head: Normocephalic and atraumatic.  Neck: Normal range of motion. Neck supple.  Cardiovascular: Normal rate and regular rhythm.  Exam reveals no gallop and no friction rub.   No murmur heard. Pulmonary/Chest: Effort normal and breath sounds normal. No respiratory distress. She has no wheezes.  Abdominal: Soft. Bowel sounds are normal. She exhibits no distension. There is no tenderness.  Musculoskeletal: Normal range of motion.  There is tenderness to palpation of the soft tissues of the lumbar region. There is no bony tenderness or step-off.  Neurological: She is alert and oriented to person, place, and time.  Strength is 5 out of 5 in both lower extremities. She is able to move and ambulate without difficulty. DTRs are difficult to elicit secondary to obesity, but appear to be trace and symmetrical.  Skin: Skin is warm and dry. She is not diaphoretic.  Nursing note and vitals reviewed.    ED Treatments / Results  Labs (all labs ordered are listed, but only abnormal results are displayed) Labs Reviewed  URINALYSIS, ROUTINE W REFLEX MICROSCOPIC (NOT AT [facility])  PREGNANCY, URINE  GC/CHLAMYDIA PROBE AMP ([place]) NOT AT [facility]    EKG  EKG Interpretation None       Radiology No results found.  Procedures Procedures (including critical care time)  Medications Ordered in ED Medications - No data to display   Initial Impression / Assessment and Plan / ED Course  I have reviewed the triage vital signs and the nursing  notes.  Pertinent labs & imaging results that were available during my care of the patient were reviewed by me and considered in my medical decision making (see chart for details).  Clinical Course    Patient presents here by EMS for evaluation of a 3 month history of vaginal odor and also a several month history of back discomfort. Her low back pain appears very musculoskeletal in nature. Her reflexes and strength are symmetrical and there are no bowel or bladder complaints that would suggest an emergent etiology. She will be given Flexeril which she can take for this. I feel as though no emergent imaging is indicated.  She is an established patient at [facility]. She has had 3 pelvic examinations performed there in the past month for workup of similar complaints.. She has had wet preps and tests for GC/chlamydia  each time she was there. I believe it to be redundant to perform this yet again, and see no indication to repeat these tests for a fourth time. She is in no distress and I see no indication for further workup or admission.. She does have evidence for a urinary tract infection which will be treated with antibiotics.  Final Clinical Impressions(s) / ED Diagnoses   Final diagnoses:  None    New Prescriptions New Prescriptions   No medications on file     Veryl Speak, MD 07/21/16 0840

## 2016-07-21 NOTE — ED Notes (Signed)
Per EMS- Chronic back pain and strong vaginal odor for 3 months. Recently had a biopsy for uterine cancer.

## 2016-07-27 ENCOUNTER — Inpatient Hospital Stay (HOSPITAL_COMMUNITY)
Admission: AD | Admit: 2016-07-27 | Discharge: 2016-07-27 | Disposition: A | Discharge status: Home / Self Care | Payer: Medicaid Other | Source: Ambulatory Visit | Attending: Obstetrics and Gynecology | Admitting: Obstetrics and Gynecology

## 2016-07-27 ENCOUNTER — Encounter (HOSPITAL_COMMUNITY): Payer: Self-pay | Admitting: *Deleted

## 2016-07-27 ENCOUNTER — Encounter: Payer: Self-pay | Admitting: Obstetrics and Gynecology

## 2016-07-27 DIAGNOSIS — N644 Mastodynia: Secondary | ICD-10-CM | POA: Diagnosis not present

## 2016-07-27 DIAGNOSIS — N6019 Diffuse cystic mastopathy of unspecified breast: Secondary | ICD-10-CM | POA: Diagnosis not present

## 2016-07-27 DIAGNOSIS — N6011 Diffuse cystic mastopathy of right breast: Secondary | ICD-10-CM

## 2016-07-27 DIAGNOSIS — N6310 Unspecified lump in the right breast, unspecified quadrant: Secondary | ICD-10-CM | POA: Insufficient documentation

## 2016-07-27 DIAGNOSIS — N6012 Diffuse cystic mastopathy of left breast: Secondary | ICD-10-CM

## 2016-07-27 HISTORY — DX: Gram-negative sepsis, unspecified: A41.50

## 2016-07-27 HISTORY — DX: Unspecified infection due to central venous catheter, initial encounter: T80.219A

## 2016-07-27 HISTORY — DX: Pneumonia, unspecified organism: J18.9

## 2016-07-27 MED ORDER — IBUPROFEN 600 MG PO TABS: 600.0000 mg | ORAL_TABLET | Freq: Four times a day (QID) | ORAL | 0 refills | Status: DC | PRN

## 2016-07-27 NOTE — MAU Provider Note (Signed)
Chief Complaint: Breast Pain   None     SUBJECTIVE HPI: Jordan [name] is a 50 y.o. G1P1001 who presents to maternity admissions reporting pain in her right breast x 2-3 days, worsening today. She reports she took 3 baby aspirin at home today which helped but not very much. She also reports taking Tylenol which does not help.  She recently had a conization of her cervix for dysplasia by Dr Rip Harbour and has a follow up appointment with him on 10/12.  She denies family history of breast cancer. Her recent pain is not associated with any other symptoms. She describes the pain as constant aching pain in the upper outer portion of her right breast extending over to behind the nipple. Touching the breast or moving her right arm makes the pain worse.  Heat and the aspirin make it better but do not take the pain away.  She reports her last mammogram was probably 10 years ago. She denies abdominal pain, vaginal bleeding, vaginal itching/burning, urinary symptoms, h/a, dizziness, n/v, or fever/chills.     HPI  Past Medical History:  Diagnosis Date  . Alcohol abuse   . Anemia   . Bipolar 1 disorder (HCC)    No medications currently  . 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 Mental disorder   . Obesity   . SVD (spontaneous vaginal delivery)    x 1  . UTI (lower urinary tract infection)   . Vaginal Pap smear, abnormal   . Yeast infection of the vagina    Past Surgical History:  Procedure Laterality Date  . CERVICAL CONIZATION W/BX N/A 07/01/2016   Procedure: CONIZATION CERVIX WITH BIOPSY;  Surgeon: Chancy Milroy, MD;  Location: [facility];  Service: Gynecology;  Laterality: N/A;  . CHOLECYSTECTOMY    . HYSTEROSCOPY W/D&C N/A 01/25/2016   Procedure: DILATATION AND CURETTAGE /HYSTEROSCOPY;  Surgeon: Mora Bellman, MD;  Location: [facility];  Service: Gynecology;  Laterality: N/A;   Social History   Social History  . Marital status: Soil scientist    Spouse name: N/A  . Number of  children: N/A  . Years of education: N/A   Occupational History  . Not on file.   Social History Main Topics  . Smoking status: Never Smoker  . Smokeless tobacco: Never Used  . Alcohol use No  . Drug use: No  . Sexual activity: Yes    Birth control/ protection: Post-menopausal     Comment: perimenopausal   Other Topics Concern  . Not on file   Social History Narrative  . No narrative on file   No current facility-administered medications on file prior to encounter.    Current Outpatient Prescriptions on File Prior to Encounter  Medication Sig Dispense Refill  . acetaminophen (TYLENOL) 500 MG tablet Take 1,000 mg by mouth every 6 (six) hours as needed for moderate pain. Reported on 04/11/2016    . ciprofloxacin (CIPRO) 500 MG tablet Take 1 tablet (500 mg total) by mouth every 12 (twelve) hours. 10 tablet 0  . cyclobenzaprine (FLEXERIL) 10 MG tablet Take 1 tablet (10 mg total) by mouth 2 (two) times daily as needed for muscle spasms. 15 tablet 0  . doxycycline (VIBRAMYCIN) 100 MG capsule Take 1 capsule (100 mg total) by mouth 2 (two) times daily. 14 capsule 0  . miconazole (MICOTIN) 2 % cream Apply 1 application topically 2 (two) times daily. May apply to external vaginal tissues (Patient not taking: Reported on 07/11/2016) 28.35 g 0  Allergies  Allergen Reactions  . Citalopram Other (See Comments)    Possible cause of pancytopenia. Swelling of tongue, face and throat  . Lamotrigine Other (See Comments)    Possible cause of pancytopenia. Swelling of face, throat and tongue  . Sulfa Antibiotics Anaphylaxis  . Tramadol Anaphylaxis, Shortness Of Breath and Swelling    ROS:  Review of Systems  Constitutional: Negative for chills, fatigue and fever.  HENT: Negative for sinus pressure.   Eyes: Negative for photophobia.  Respiratory: Negative for shortness of breath.   Cardiovascular: Negative for chest pain.  Gastrointestinal: Negative for constipation, diarrhea, nausea and  vomiting.  Genitourinary: Negative for difficulty urinating, dysuria, flank pain, frequency, pelvic pain, vaginal bleeding, vaginal discharge and vaginal pain.  Musculoskeletal: Negative for neck pain.       Pain of right breast at rest and with movement  Neurological: Negative for dizziness, weakness and headaches.  Psychiatric/Behavioral: Negative.     I have reviewed patient's Past Medical Hx, Surgical Hx, Family Hx, Social Hx, medications and allergies.   Physical Exam  Patient Vitals for the past 24 hrs:  BP Temp Temp src Pulse Resp SpO2  07/27/16 1505 143/97 98.1 F (36.7 C) Oral 101 18 98 %    VS reviewed, nursing note reviewed,  Constitutional: well developed, well nourished, no distress HEENT: normocephalic CV: normal rate Pulm/chest wall: normal effort Breast Exam:  right breast normal but with multiple smooth, soft, round, small (0.5cm or less) masses palpable, no skin or nipple changes or axillary nodes, left breast normal with similar smooth, round, soft masses and no skin or nipple changes or axillary nodes Abdomen: soft Neuro: alert and oriented x 3 Skin: warm, dry Psych: affect normal Pelvic exam: Deferred   LAB RESULTS  IMAGING No results found.  MAU Management/MDM: Breast exam findings benign with fibrocystic breast changes of both breasts.  Tenderness of both breasts on exam.  Reassurance provided to pt and her husband but outpatient diagnostic mammogram ordered at [facility] for further evaluation. Pt to f/u with Dr Rip Harbour as scheduled. Pt stable at time of discharge.  ASSESSMENT 1. Breast pain, right   2. Fibrocystic breast changes of both breasts     PLAN Discharge home   Medication List    TAKE these medications   acetaminophen 500 MG tablet Commonly known as:  TYLENOL Take 1,000 mg by mouth every 6 (six) hours as needed for moderate pain. Reported on 04/11/2016   ciprofloxacin 500 MG tablet Commonly known as:  CIPRO Take 1 tablet (500 mg  total) by mouth every 12 (twelve) hours.   cyclobenzaprine 10 MG tablet Commonly known as:  FLEXERIL Take 1 tablet (10 mg total) by mouth 2 (two) times daily as needed for muscle spasms.   doxycycline 100 MG capsule Commonly known as:  VIBRAMYCIN Take 1 capsule (100 mg total) by mouth 2 (two) times daily.   ibuprofen 600 MG tablet Commonly known as:  ADVIL,MOTRIN Take 1 tablet (600 mg total) by mouth every 6 (six) hours as needed.   miconazole 2 % cream Commonly known as:  MICOTIN Apply 1 application topically 2 (two) times daily. May apply to external vaginal tissues      Follow-up Information    [facility] .   Specialty:  Diagnostic Radiology Why:  [facility] will call you with a mammogram appointment or call the number listed.  Contact information: [place] [address] [phone]  Chancy Milroy, MD .   Specialty:  Obstetrics and Gynecology Why:  As scheduled for Gyn follow up Contact information: [place] [address] [place] Certified Nurse-Midwife 07/27/2016  4:30 PM

## 2016-07-27 NOTE — MAU Note (Signed)
Pt states her right breast has been hurting since yesterday.  Pt states it feels like someone hit her breast really hard but no one has.

## 2016-07-27 NOTE — Discharge Instructions (Signed)
Fibrocystic Breast Changes °Fibrocystic breast changes occur when breast ducts become blocked, causing painful, fluid-filled lumps (cysts) to form in the breast. This is a common condition that is noncancerous (benign). It occurs when women go through hormonal changes during their menstrual cycle. Fibrocystic breast changes can affect one or both breasts. °CAUSES  °The exact cause of fibrocystic breast changes is not known, but it may be related to the female hormones estrogen and progesterone. Family traits that get passed from parent to child (genetics) may also be a factor in some cases. °SIGNS AND SYMPTOMS  °· Tenderness, mild discomfort, or pain.   °· Swelling.   °· Rope-like feeling when touching the breast.   °· Lumpy breast, one or both sides.   °· Changes in breast size, especially before (larger) and after (smaller) the menstrual period.   °· Green or dark brown nipple discharge (not blood).   °Symptoms are usually worse before menstrual periods start and get better toward the end of the menstrual period.  °DIAGNOSIS  °To make a diagnosis, your health care provider will ask you questions and perform a physical exam of your breasts. The health care provider may recommend other tests that can examine inside your breasts, such as: °· A breast X-ray (mammogram).   °· Ultrasonography.  °· An MRI.   °If something more than fibrocystic breast changes is suspected, your health care provider may take a breast tissue sample (breast biopsy) to examine. °TREATMENT  °Often, treatment is not needed. Your health care provider may recommend over-the-counter pain relievers to help lessen pain or discomfort caused by the fibrocystic breast changes. You may also be asked to change your diet to limit or stop eating foods or drinking beverages that contain caffeine. Foods and beverages that contain caffeine include chocolate, soda, coffee, and tea. Reducing sugar and fat in your diet may also help. Your health care provider  may also recommend: °· Fine needle aspiration to remove fluid from a cyst that is causing pain.   °· Surgery to remove a large, persistent, and tender cyst. °HOME CARE INSTRUCTIONS  °· Examine your breasts after every menstrual period. If you do not have menstrual periods, check your breasts the first day of every month. Feel for changes, such as more tenderness, a new growth, a change in breast size, or a change in a lump that has always been there.   °· Only take over-the-counter or prescription medicine as directed by your health care provider.   °· Wear a well-fitted support or sports bra, especially when exercising.   °· Decrease or avoid caffeine, fat, and sugar in your diet as directed by your health care provider.   °SEEK MEDICAL CARE IF:  °· You have fluid leaking (discharge) from your nipples, especially bloody discharge.   °· You have new lumps or bumps in the breast.   °· Your breast or breasts become enlarged, red, and painful.   °· You have areas of your breast that pucker in.   °· Your nipples appear flat or indented.   °  °This information is not intended to replace advice given to you by your health care provider. Make sure you discuss any questions you have with your health care provider. °  °Document Released: 07/24/2006 Document Revised: 06/28/2015 Document Reviewed: 03/28/2013 °Elsevier Interactive Patient Education ©2016 [employer] ° ° °

## 2016-08-01 ENCOUNTER — Encounter: Payer: Self-pay | Admitting: Obstetrics and Gynecology

## 2016-08-01 ENCOUNTER — Ambulatory Visit (INDEPENDENT_AMBULATORY_CARE_PROVIDER_SITE_OTHER): Payer: Medicaid Other | Admitting: Obstetrics and Gynecology

## 2016-08-01 VITALS — BP 155/68 | HR 72 | Wt 348.5 lb

## 2016-08-01 DIAGNOSIS — Z1239 Encounter for other screening for malignant neoplasm of breast: Secondary | ICD-10-CM

## 2016-08-01 DIAGNOSIS — Z1231 Encounter for screening mammogram for malignant neoplasm of breast: Secondary | ICD-10-CM

## 2016-08-09 NOTE — Progress Notes (Signed)
Subjective:     Jordan [name] is a 50 y.o. female who presents to the clinic 4 weeks status post CKC for HGSIL and inadequate colposcopy . Eating a regular diet without difficulty. Bowel movements are normal. The patient is not having any pain.  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 Reviewed with pt and husband    Review of Systems Pertinent items are noted in HPI.    Objective:    BP (!) 155/68   Pulse 72   Wt (!) 348 lb 8 oz (158.1 kg)   LMP  (LMP Unknown)   BMI 52.99 kg/m  General:  alert obese  Abdomen: soft, bowel sounds active, non-tender  Pelvic, N l EGBUS, cervix healing well     Assessment:    Doing well postoperatively. Operative findings again reviewed. Pathology report discussed.   Management options discussed. Pt is a poor surgerical candidate. Recommend following with pap smears q 4 months x 1 year. If any pap smears returns as abnormal reevaluation for surgery would be indicated after work up. Pt and husband verbalized understanding and agree to POC. Plan:    1. Continue any current medications. 2. Wound care discussed. 3. Activity restrictions: none 4. Anticipated return to work: not applicable. 5. Follow up: 3 months for repeat pap smear

## 2016-08-17 ENCOUNTER — Inpatient Hospital Stay (HOSPITAL_COMMUNITY)
Admission: AD | Admit: 2016-08-17 | Discharge: 2016-08-17 | Disposition: A | Discharge status: Home / Self Care | Payer: Medicaid Other | Source: Ambulatory Visit | Attending: Obstetrics & Gynecology | Admitting: Obstetrics & Gynecology

## 2016-08-17 ENCOUNTER — Encounter (HOSPITAL_COMMUNITY): Payer: Self-pay | Admitting: *Deleted

## 2016-08-17 DIAGNOSIS — Z01419 Encounter for gynecological examination (general) (routine) without abnormal findings: Secondary | ICD-10-CM

## 2016-08-17 DIAGNOSIS — N760 Acute vaginitis: Secondary | ICD-10-CM | POA: Diagnosis not present

## 2016-08-17 DIAGNOSIS — Z3202 Encounter for pregnancy test, result negative: Secondary | ICD-10-CM | POA: Diagnosis not present

## 2016-08-17 LAB — URINALYSIS, ROUTINE W REFLEX MICROSCOPIC
Bilirubin Urine: NEGATIVE
Glucose, UA: NEGATIVE mg/dL
Ketones, ur: NEGATIVE mg/dL
Nitrite: POSITIVE — AB
Protein, ur: NEGATIVE mg/dL
Specific Gravity, Urine: 1.025 (ref 1.005–1.030)
pH: 5.5 (ref 5.0–8.0)

## 2016-08-17 LAB — URINE MICROSCOPIC-ADD ON

## 2016-08-17 LAB — WET PREP, GENITAL
Clue Cells Wet Prep HPF POC: NONE SEEN
Sperm: NONE SEEN
Trich, Wet Prep: NONE SEEN
Yeast Wet Prep HPF POC: NONE SEEN

## 2016-08-17 LAB — POCT PREGNANCY, URINE: Preg Test, Ur: NEGATIVE

## 2016-08-17 NOTE — MAU Provider Note (Signed)
History     CSN: [id number]  Arrival date and time: 08/17/16 1651   First Provider Initiated Contact with Patient 08/17/16 1741      Chief Complaint  Patient presents with  . Vaginitis   HPI Jordan [name] 50 y.o.  Comes to MAU as she has vaginal odor.  Has questions about douching.  She and her partner have come several times to MAU with the same complaint.   OB History    Gravida Para Term Preterm AB Living   1 1 1     1    SAB TAB Ectopic Multiple Live Births           1      Past Medical History:  Diagnosis Date  . Alcohol abuse   . Anemia   . Bipolar 1 disorder (HCC)    No medications currently  . CAP (community acquired pneumonia) 03/17/2015  . 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 Mental disorder   . Obesity   . PICC line infection 05/17/2015  . Sepsis due to Gram negative bacteria (MDR E Coli) 02/18/2015  . SVD (spontaneous vaginal delivery)    x 1  . UTI (lower urinary tract infection)   . Vaginal Pap smear, abnormal   . Yeast infection of the vagina     Past Surgical History:  Procedure Laterality Date  . CERVICAL CONIZATION W/BX N/A 07/01/2016   Procedure: CONIZATION CERVIX WITH BIOPSY;  Surgeon: Chancy Milroy, MD;  Location: [facility];  Service: Gynecology;  Laterality: N/A;  . CHOLECYSTECTOMY    . HYSTEROSCOPY W/D&C N/A 01/25/2016   Procedure: DILATATION AND CURETTAGE /HYSTEROSCOPY;  Surgeon: Mora Bellman, MD;  Location: [facility];  Service: Gynecology;  Laterality: N/A;    No family history on file.  Social History  Substance Use Topics  . Smoking status: Never Smoker  . Smokeless tobacco: Never Used  . Alcohol use No    Allergies:  Allergies  Allergen Reactions  . Citalopram Other (See Comments)    Possible cause of pancytopenia. Swelling of tongue, face and throat  . Lamotrigine Other (See Comments)    Possible cause of pancytopenia. Swelling of face, throat and tongue  . Sulfa Antibiotics Anaphylaxis  . Tramadol  Anaphylaxis, Shortness Of Breath and Swelling    Prescriptions Prior to Admission  Medication Sig Dispense Refill Last Dose  . acetaminophen (TYLENOL) 500 MG tablet Take 1,000 mg by mouth every 6 (six) hours as needed for moderate pain. Reported on 04/11/2016   Past Week at Unknown time  . ciprofloxacin (CIPRO) 500 MG tablet Take 1 tablet (500 mg total) by mouth every 12 (twelve) hours. 10 tablet 0   . cyclobenzaprine (FLEXERIL) 10 MG tablet Take 1 tablet (10 mg total) by mouth 2 (two) times daily as needed for muscle spasms. 15 tablet 0   . doxycycline (VIBRAMYCIN) 100 MG capsule Take 1 capsule (100 mg total) by mouth 2 (two) times daily. 14 capsule 0   . ibuprofen (ADVIL,MOTRIN) 600 MG tablet Take 1 tablet (600 mg total) by mouth every 6 (six) hours as needed. 30 tablet 0   . miconazole (MICOTIN) 2 % cream Apply 1 application topically 2 (two) times daily. May apply to external vaginal tissues (Patient not taking: Reported on 07/11/2016) 28.35 g 0 Not Taking at Unknown time    Review of Systems  Constitutional: Negative for fever.  Gastrointestinal: Negative for abdominal pain, nausea and vomiting.  Genitourinary: Negative for dysuria.  Vaginal odor   Physical Exam   Blood pressure 130/71, pulse 92, temperature 98.1 F (36.7 C), resp. rate 18.  Physical Exam  Nursing note and vitals reviewed. Constitutional: She is oriented to person, place, and time. She appears well-developed and well-nourished.  Morbidly obese Questionable developmental delay  HENT:  Head: Normocephalic.  Eyes: EOM are normal.  Neck: Neck supple.  GI: Soft. There is no tenderness.  Genitourinary:  Genitourinary Comments: Speculum exam: Vulva - lots of deep skin folds - unable to determine if odor is actually vaginal vs from body habitus  Vagina - Small amount of pale yellow discharge Cervix - No contact bleeding, difficult to fully visualize Bimanual exam deferred as client is not having  pain. GC/Chlam, wet prep done Chaperone present for exam.   Musculoskeletal: Normal range of motion.  Neurological: She is alert and oriented to person, place, and time.  Skin: Skin is warm and dry.  Psychiatric: She has a normal mood and affect.    MAU Course  Procedures Results for orders placed or performed during the hospital encounter of 08/17/16 (from the past 24 hour(s))  Urinalysis, Routine w reflex microscopic (not at [facility])     Status: Abnormal   Collection Time: 08/17/16  5:00 PM  Result Value Ref Range   Color, Urine YELLOW YELLOW   APPearance HAZY (A) CLEAR   Specific Gravity, Urine 1.025 1.005 - 1.030   pH 5.5 5.0 - 8.0   Glucose, UA NEGATIVE NEGATIVE mg/dL   Hgb urine dipstick TRACE (A) NEGATIVE   Bilirubin Urine NEGATIVE NEGATIVE   Ketones, ur NEGATIVE NEGATIVE mg/dL   Protein, ur NEGATIVE NEGATIVE mg/dL   Nitrite POSITIVE (A) NEGATIVE   Leukocytes, UA SMALL (A) NEGATIVE  Urine microscopic-add on     Status: Abnormal   Collection Time: 08/17/16  5:00 PM  Result Value Ref Range   Squamous Epithelial / LPF 0-5 (A) NONE SEEN   WBC, UA 6-30 0 - 5 WBC/hpf   RBC / HPF 0-5 0 - 5 RBC/hpf   Bacteria, UA MANY (A) NONE SEEN  Pregnancy, urine POC     Status: None   Collection Time: 08/17/16  5:08 PM  Result Value Ref Range   Preg Test, Ur NEGATIVE NEGATIVE  Wet prep, genital     Status: Abnormal   Collection Time: 08/17/16  5:40 PM  Result Value Ref Range   Yeast Wet Prep HPF POC NONE SEEN NONE SEEN   Trich, Wet Prep NONE SEEN NONE SEEN   Clue Cells Wet Prep HPF POC NONE SEEN NONE SEEN   WBC, Wet Prep HPF POC FEW (A) NONE SEEN   Sperm NONE SEEN     MDM Urine culture ordered. Peribottles given to help with perineal hygiene as client is complaining of odor, but labs do not support diagnosis of BV.  Suspect client has odor related to body habitus.  Encouraged good cleaning of deep body folds.  Assessment and Plan  Morbid obesity No infection found today Labs  pending.  Plan Follow up in [facility] clinic for further evaluation.  Need regular visits to clinic to avoid coming to MAU for non emergency problems. Use peribottles for cleansing. No douching.  Jordan [name] L Zohair Epp 08/17/2016, 5:42 PM

## 2016-08-17 NOTE — MAU Note (Signed)
Pt reports she has had a vaginal odor for 7 months that has not gotten any better . Has gone to GYN doctor and they tell her there is nothing wrong. Denies pain or cramping

## 2016-08-19 LAB — GC/CHLAMYDIA PROBE AMP (~~LOC~~) NOT AT ARMC
Chlamydia: NEGATIVE
Neisseria Gonorrhea: NEGATIVE

## 2016-08-20 LAB — URINE CULTURE: Culture: >=100000 — AB

## 2016-08-21 ENCOUNTER — Telehealth: Payer: Self-pay | Admitting: Certified Nurse Midwife

## 2016-08-21 MED ORDER — CEPHALEXIN 500 MG PO CAPS: 500.0000 mg | ORAL_CAPSULE | Freq: Two times a day (BID) | ORAL | 0 refills | Status: AC

## 2016-08-21 NOTE — Telephone Encounter (Signed)
Notified pt of +UTI and Rx sent to pharmacy.

## 2016-09-16 ENCOUNTER — Encounter (HOSPITAL_COMMUNITY): Payer: Self-pay | Admitting: Emergency Medicine

## 2016-09-16 ENCOUNTER — Emergency Department (HOSPITAL_COMMUNITY)
Admission: EM | Admit: 2016-09-16 | Discharge: 2016-09-16 | Disposition: A | Discharge status: Home / Self Care | Payer: Medicaid Other | Attending: Dermatology | Admitting: Dermatology

## 2016-09-16 DIAGNOSIS — M545 Low back pain: Secondary | ICD-10-CM | POA: Diagnosis not present

## 2016-09-16 DIAGNOSIS — Z5321 Procedure and treatment not carried out due to patient leaving prior to being seen by health care provider: Secondary | ICD-10-CM | POA: Insufficient documentation

## 2016-09-16 NOTE — ED Triage Notes (Signed)
Pt absent from waiting room . Pt name call x 2.

## 2016-09-16 NOTE — ED Notes (Signed)
Unable to find pt in waiting room. 

## 2016-09-16 NOTE — ED Triage Notes (Addendum)
Pt to ED via [facility] for lower back pain x 2 months..  Pt's husband is a pt in the ED and pt told EMS that she called them because she needed a ride to the hospital to see her husband.  Pt informed triage RN that she does have chronic back pain but called 911 to give her a ride to the hospital. Pt anxious on arrival and is asking if she is going to be arrested.

## 2016-09-20 ENCOUNTER — Encounter (HOSPITAL_COMMUNITY): Payer: Self-pay | Admitting: *Deleted

## 2016-09-20 ENCOUNTER — Encounter (HOSPITAL_COMMUNITY): Payer: Self-pay | Admitting: Emergency Medicine

## 2016-09-20 ENCOUNTER — Inpatient Hospital Stay (EMERGENCY_DEPARTMENT_HOSPITAL)
Admission: AD | Admit: 2016-09-20 | Discharge: 2016-09-20 | Disposition: A | Discharge status: Home / Self Care | Payer: Medicaid Other | Source: Ambulatory Visit | Attending: Obstetrics & Gynecology | Admitting: Obstetrics & Gynecology

## 2016-09-20 DIAGNOSIS — N390 Urinary tract infection, site not specified: Secondary | ICD-10-CM | POA: Insufficient documentation

## 2016-09-20 DIAGNOSIS — R1030 Lower abdominal pain, unspecified: Secondary | ICD-10-CM | POA: Insufficient documentation

## 2016-09-20 DIAGNOSIS — N3000 Acute cystitis without hematuria: Secondary | ICD-10-CM | POA: Diagnosis not present

## 2016-09-20 DIAGNOSIS — R197 Diarrhea, unspecified: Secondary | ICD-10-CM | POA: Diagnosis not present

## 2016-09-20 DIAGNOSIS — Z3202 Encounter for pregnancy test, result negative: Secondary | ICD-10-CM

## 2016-09-20 DIAGNOSIS — T3695XD Adverse effect of unspecified systemic antibiotic, subsequent encounter: Secondary | ICD-10-CM | POA: Diagnosis not present

## 2016-09-20 LAB — URINALYSIS, ROUTINE W REFLEX MICROSCOPIC
Glucose, UA: NEGATIVE mg/dL
Ketones, ur: 15 mg/dL — AB
Nitrite: POSITIVE — AB
Protein, ur: NEGATIVE mg/dL
Specific Gravity, Urine: 1.025 (ref 1.005–1.030)
pH: 6 (ref 5.0–8.0)

## 2016-09-20 LAB — CBC WITH DIFFERENTIAL/PLATELET
Basophils Absolute: 0 10*3/uL (ref 0.0–0.1)
Basophils Relative: 0 %
Eosinophils Absolute: 0.1 10*3/uL (ref 0.0–0.7)
Eosinophils Relative: 1 %
HCT: 45.8 % (ref 36.0–46.0)
Hemoglobin: 16.4 g/dL — ABNORMAL HIGH (ref 12.0–15.0)
Lymphocytes Relative: 20 %
Lymphs Abs: 1.9 10*3/uL (ref 0.7–4.0)
MCH: 30.8 pg (ref 26.0–34.0)
MCHC: 35.8 g/dL (ref 30.0–36.0)
MCV: 86.1 fL (ref 78.0–100.0)
Monocytes Absolute: 0.5 10*3/uL (ref 0.1–1.0)
Monocytes Relative: 5 %
Neutro Abs: 6.8 10*3/uL (ref 1.7–7.7)
Neutrophils Relative %: 74 %
Platelets: 205 10*3/uL (ref 150–400)
RBC: 5.32 MIL/uL — ABNORMAL HIGH (ref 3.87–5.11)
RDW: 13.2 % (ref 11.5–15.5)
WBC: 9.2 10*3/uL (ref 4.0–10.5)

## 2016-09-20 LAB — POCT PREGNANCY, URINE: Preg Test, Ur: NEGATIVE

## 2016-09-20 LAB — URINE MICROSCOPIC-ADD ON

## 2016-09-20 MED ORDER — CIPROFLOXACIN HCL 500 MG PO TABS: 500.0000 mg | ORAL_TABLET | Freq: Two times a day (BID) | ORAL | 0 refills | Status: DC

## 2016-09-20 NOTE — Progress Notes (Signed)
Julie Wenzel PA in earlier to discuss d/c plan. Written and verbal d/c instructions given and understanding voiced. 

## 2016-09-20 NOTE — MAU Provider Note (Signed)
History     CSN: [id number]  Arrival date and time: 09/20/16 [id number]   First Provider Initiated Contact with Patient 09/20/16 2005      Chief Complaint  Patient presents with  . Back Pain   HPI [name] is a 50 y.o. [id number] who presents to MAU today with complaint of dark urine with odor. The patient states that she continues to be diagnosed with UTIs and takes the medication and then the odor doesn't go away. She states that she completes the medications each time. She was last treated with Keflex on 08/21/16 and still has the odor. She is concerned that her urine is always very dark. She states that she only drinks soda and never drinks water. She also states chronic low back pain. She denies flank pain, abdominal pain, dysuria, hematuria, fever or vaginal bleeding today. She is concerned because she became septic in the past due to a UTI.   OB History    Gravida Para Term Preterm AB Living   1 1 1     1    SAB TAB Ectopic Multiple Live Births           1      Past Medical History:  Diagnosis Date  . Alcohol abuse   . Anemia   . Bipolar 1 disorder (HCC)    No medications currently  . CAP (community acquired pneumonia) 03/17/2015  . 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 Mental disorder   . Obesity   . PICC line infection 05/17/2015  . Sepsis due to Gram negative bacteria (MDR E Coli) 02/18/2015  . SVD (spontaneous vaginal delivery)    x 1  . UTI (lower urinary tract infection)   . Vaginal Pap smear, abnormal   . Yeast infection of the vagina     Past Surgical History:  Procedure Laterality Date  . CERVICAL CONIZATION W/BX N/A 07/01/2016   Procedure: CONIZATION CERVIX WITH BIOPSY;  Surgeon: Chancy Milroy, MD;  Location: [facility];  Service: Gynecology;  Laterality: N/A;  . CHOLECYSTECTOMY    . HYSTEROSCOPY W/D&C N/A 01/25/2016   Procedure: DILATATION AND CURETTAGE /HYSTEROSCOPY;  Surgeon: Mora Bellman, MD;  Location: [facility];  Service:  Gynecology;  Laterality: N/A;    History reviewed. No pertinent family history.  Social History  Substance Use Topics  . Smoking status: Never Smoker  . Smokeless tobacco: Never Used  . Alcohol use No    Allergies:  Allergies  Allergen Reactions  . Citalopram Other (See Comments)    Possible cause of pancytopenia. Swelling of tongue, face and throat  . Lamotrigine Other (See Comments)    Possible cause of pancytopenia. Swelling of face, throat and tongue  . Sulfa Antibiotics Anaphylaxis  . Tramadol Anaphylaxis, Shortness Of Breath and Swelling    Prescriptions Prior to Admission  Medication Sig Dispense Refill Last Dose  . ciprofloxacin (CIPRO) 500 MG tablet Take 1 tablet (500 mg total) by mouth every 12 (twelve) hours. (Patient not taking: Reported on 09/20/2016) 10 tablet 0 Not Taking at Unknown time  . cyclobenzaprine (FLEXERIL) 10 MG tablet Take 1 tablet (10 mg total) by mouth 2 (two) times daily as needed for muscle spasms. (Patient not taking: Reported on 09/20/2016) 15 tablet 0 Not Taking at Unknown time  . doxycycline (VIBRAMYCIN) 100 MG capsule Take 1 capsule (100 mg total) by mouth 2 (two) times daily. (Patient not taking: Reported on 09/20/2016) 14 capsule 0 Not Taking at Unknown time  .  ibuprofen (ADVIL,MOTRIN) 600 MG tablet Take 1 tablet (600 mg total) by mouth every 6 (six) hours as needed. (Patient not taking: Reported on 09/20/2016) 30 tablet 0 Not Taking at Unknown time  . miconazole (MICOTIN) 2 % cream Apply 1 application topically 2 (two) times daily. May apply to external vaginal tissues (Patient not taking: Reported on 07/11/2016) 28.35 g 0 Not Taking at Unknown time    Review of Systems  Constitutional: Negative for fever and malaise/fatigue.  Gastrointestinal: Negative for abdominal pain, constipation, diarrhea, nausea and vomiting.       + loose stools  Genitourinary: Negative for dysuria, flank pain, frequency, hematuria and urgency.       Neg - vaginal  bleeding   Physical Exam   Blood pressure 139/90, pulse 99, temperature 97.8 F (36.6 C), resp. rate 18, height 5\' 8"  (1.727 m), weight (!) 366 lb (166 kg), SpO2 97 %.  Physical Exam  Nursing note and vitals reviewed. Constitutional: She is oriented to person, place, and time. She appears well-developed and well-nourished. No distress.  HENT:  Head: Normocephalic and atraumatic.  Cardiovascular: Normal rate.   Respiratory: Effort normal.  GI: Soft. She exhibits no distension and no mass. There is tenderness (mild LLQ tenderness to palpation). There is no rebound, no guarding and no CVA tenderness.  Neurological: She is alert and oriented to person, place, and time.  Skin: Skin is warm and dry. No erythema.  Psychiatric: She has a normal mood and affect.    Results for orders placed or performed during the hospital encounter of 09/20/16 (from the past 24 hour(s))  Urinalysis, Routine w reflex microscopic (not at [facility])     Status: Abnormal   Collection Time: 09/20/16  7:39 PM  Result Value Ref Range   Color, Urine YELLOW YELLOW   APPearance HAZY (A) CLEAR   Specific Gravity, Urine 1.025 1.005 - 1.030   pH 6.0 5.0 - 8.0   Glucose, UA NEGATIVE NEGATIVE mg/dL   Hgb urine dipstick MODERATE (A) NEGATIVE   Bilirubin Urine SMALL (A) NEGATIVE   Ketones, ur 15 (A) NEGATIVE mg/dL   Protein, ur NEGATIVE NEGATIVE mg/dL   Nitrite POSITIVE (A) NEGATIVE   Leukocytes, UA MODERATE (A) NEGATIVE  Urine microscopic-add on     Status: Abnormal   Collection Time: 09/20/16  7:39 PM  Result Value Ref Range   Squamous Epithelial / LPF 6-30 (A) NONE SEEN   WBC, UA 6-30 0 - 5 WBC/hpf   RBC / HPF 0-5 0 - 5 RBC/hpf   Bacteria, UA MANY (A) NONE SEEN  Pregnancy, urine POC     Status: None   Collection Time: 09/20/16  7:46 PM  Result Value Ref Range   Preg Test, Ur NEGATIVE NEGATIVE  CBC with Differential/Platelet     Status: Abnormal   Collection Time: 09/20/16  9:04 PM  Result Value Ref Range   WBC  9.2 4.0 - 10.5 K/uL   RBC 5.32 (H) 3.87 - 5.11 MIL/uL   Hemoglobin 16.4 (H) 12.0 - 15.0 g/dL   HCT 45.8 36.0 - 46.0 %   MCV 86.1 78.0 - 100.0 fL   MCH 30.8 26.0 - 34.0 pg   MCHC 35.8 30.0 - 36.0 g/dL   RDW 13.2 11.5 - 15.5 %   Platelets 205 150 - 400 K/uL   Neutrophils Relative % 74 %   Neutro Abs 6.8 1.7 - 7.7 K/uL   Lymphocytes Relative 20 %   Lymphs Abs 1.9 0.7 - 4.0 K/uL  Monocytes Relative 5 %   Monocytes Absolute 0.5 0.1 - 1.0 K/uL   Eosinophils Relative 1 %   Eosinophils Absolute 0.1 0.0 - 0.7 K/uL   Basophils Relative 0 %   Basophils Absolute 0.0 0.0 - 0.1 K/uL    MAU Course  Procedures None  MDM UPT - negative UA shows evidence of UTI Urine culture and CBC ordered  Assessment and Plan  A: UTI  P: Discharge home Rx for Cipro sent to patient's pharmacy  Warning signs for worsening condition discussed Patient advised to follow-up with [facility] as scheduled for routine GYN care Patient may return to MAU as needed or if her condition were to change or worsen, however since patient is not pregnant advised to go to [facility] or [facility] for further evaluation and management    Luvenia Redden, PA-C  09/20/2016, 9:30 PM

## 2016-09-20 NOTE — MAU Note (Signed)
Pt requested to speak with Tomi Bamberger PA before d/c. PA aware and will speak with pt

## 2016-09-20 NOTE — MAU Note (Signed)
Pain when I pee for over a yr. Strong vag odor. Every time I come in they tell me nothing is wrong and I know there is. Pain in lower back for 2 months. Some vag d/c that is yellow.

## 2016-09-20 NOTE — Discharge Instructions (Signed)
Urinary Tract Infection, Adult Introduction A urinary tract infection (UTI) is an infection of any part of the urinary tract. The urinary tract includes the:  Kidneys.  Ureters.  Bladder.  Urethra. These organs make, store, and get rid of pee (urine) in the body. Follow these instructions at home:  Take over-the-counter and prescription medicines only as told by your doctor.  If you were prescribed an antibiotic medicine, take it as told by your doctor. Do not stop taking the antibiotic even if you start to feel better.  Avoid the following drinks:  Alcohol.  Caffeine.  Tea.  Carbonated drinks.  Drink enough fluid to keep your pee clear or pale yellow.  Keep all follow-up visits as told by your doctor. This is important.  Make sure to:  Empty your bladder often and completely. Do not to hold pee for long periods of time.  Empty your bladder before and after sex.  Wipe from front to back after a bowel movement if you are female. Use each tissue one time when you wipe. Contact a doctor if:  You have back pain.  You have a fever.  You feel sick to your stomach (nauseous).  You throw up (vomit).  Your symptoms do not get better after 3 days.  Your symptoms go away and then come back. Get help right away if:  You have very bad back pain.  You have very bad lower belly (abdominal) pain.  You are throwing up and cannot keep down any medicines or water. This information is not intended to replace advice given to you by your health care provider. Make sure you discuss any questions you have with your health care provider. Document Released: 03/25/2008 Document Revised: 03/14/2016 Document Reviewed: 08/28/2015  2017 Elsevier  

## 2016-09-20 NOTE — ED Triage Notes (Signed)
Pt states her stools are green and loose and smells like mold  Pt states it started 2 days ago  Pt states she was seen earlier tonight at [facility] and they told her she had to come here because they could not help her with her diarrhea issue

## 2016-09-21 ENCOUNTER — Emergency Department (HOSPITAL_COMMUNITY): Payer: Medicaid Other

## 2016-09-21 ENCOUNTER — Emergency Department (HOSPITAL_COMMUNITY)
Admission: EM | Admit: 2016-09-21 | Discharge: 2016-09-21 | Disposition: A | Discharge status: Home / Self Care | Payer: Medicaid Other | Attending: Emergency Medicine | Admitting: Emergency Medicine

## 2016-09-21 ENCOUNTER — Encounter (HOSPITAL_COMMUNITY): Payer: Self-pay | Admitting: Radiology

## 2016-09-21 ENCOUNTER — Inpatient Hospital Stay (EMERGENCY_DEPARTMENT_HOSPITAL)
Admission: AD | Admit: 2016-09-21 | Discharge: 2016-09-21 | Disposition: A | Discharge status: Home / Self Care | Payer: Medicaid Other | Source: Ambulatory Visit | Attending: Obstetrics & Gynecology | Admitting: Obstetrics & Gynecology

## 2016-09-21 DIAGNOSIS — T368X5A Adverse effect of other systemic antibiotics, initial encounter: Secondary | ICD-10-CM

## 2016-09-21 DIAGNOSIS — R197 Diarrhea, unspecified: Secondary | ICD-10-CM

## 2016-09-21 DIAGNOSIS — T3695XD Adverse effect of unspecified systemic antibiotic, subsequent encounter: Secondary | ICD-10-CM | POA: Diagnosis not present

## 2016-09-21 DIAGNOSIS — R109 Unspecified abdominal pain: Secondary | ICD-10-CM

## 2016-09-21 LAB — CBC WITH DIFFERENTIAL/PLATELET
Basophils Absolute: 0 10*3/uL (ref 0.0–0.1)
Basophils Relative: 0 %
Eosinophils Absolute: 0 10*3/uL (ref 0.0–0.7)
Eosinophils Relative: 0 %
HCT: 43.7 % (ref 36.0–46.0)
Hemoglobin: 15.2 g/dL — ABNORMAL HIGH (ref 12.0–15.0)
Lymphocytes Relative: 19 %
Lymphs Abs: 1.6 10*3/uL (ref 0.7–4.0)
MCH: 30.3 pg (ref 26.0–34.0)
MCHC: 34.8 g/dL (ref 30.0–36.0)
MCV: 87.1 fL (ref 78.0–100.0)
Monocytes Absolute: 0.9 10*3/uL (ref 0.1–1.0)
Monocytes Relative: 10 %
Neutro Abs: 5.9 10*3/uL (ref 1.7–7.7)
Neutrophils Relative %: 71 %
Platelets: 217 10*3/uL (ref 150–400)
RBC: 5.02 MIL/uL (ref 3.87–5.11)
RDW: 13.2 % (ref 11.5–15.5)
WBC: 8.4 10*3/uL (ref 4.0–10.5)

## 2016-09-21 LAB — COMPREHENSIVE METABOLIC PANEL
ALT: 38 U/L (ref 14–54)
AST: 37 U/L (ref 15–41)
Albumin: 4.4 g/dL (ref 3.5–5.0)
Alkaline Phosphatase: 109 U/L (ref 38–126)
Anion gap: 11 (ref 5–15)
BUN: 12 mg/dL (ref 6–20)
CO2: 23 mmol/L (ref 22–32)
Calcium: 9 mg/dL (ref 8.9–10.3)
Chloride: 107 mmol/L (ref 101–111)
Creatinine, Ser: 0.76 mg/dL (ref 0.44–1.00)
GFR calc Af Amer: >60 mL/min (ref >60)
GFR calc non Af Amer: >60 mL/min (ref >60)
Glucose, Bld: 94 mg/dL (ref 65–99)
Potassium: 3.6 mmol/L (ref 3.5–5.1)
Sodium: 141 mmol/L (ref 135–145)
Total Bilirubin: 1.8 mg/dL — ABNORMAL HIGH (ref 0.3–1.2)
Total Protein: 7.2 g/dL (ref 6.5–8.1)

## 2016-09-21 LAB — LIPASE, BLOOD: Lipase: 20 U/L (ref 11–51)

## 2016-09-21 MED ORDER — IOPAMIDOL (ISOVUE-300) INJECTION 61%
100.0000 mL | Freq: Once | INTRAVENOUS | Status: AC | PRN
  Administered 2016-09-21: 100 mL via INTRAVENOUS

## 2016-09-21 MED ORDER — SODIUM CHLORIDE 0.9 % IV BOLUS (SEPSIS)
1000.0000 mL | Freq: Once | INTRAVENOUS | Status: AC
  Administered 2016-09-21: 1000 mL via INTRAVENOUS

## 2016-09-21 MED ORDER — DIPHENHYDRAMINE HCL 25 MG PO CAPS
25.0000 mg | ORAL_CAPSULE | Freq: Once | ORAL | Status: AC
  Administered 2016-09-21: 25 mg via ORAL
  Filled 2016-09-21: qty 1

## 2016-09-21 MED ORDER — FENTANYL CITRATE (PF) 100 MCG/2ML IJ SOLN
50.0000 ug | INTRAMUSCULAR | Status: DC | PRN
  Administered 2016-09-21: 50 ug via INTRAVENOUS

## 2016-09-21 MED ORDER — NITROFURANTOIN MONOHYD MACRO 100 MG PO CAPS: 100.0000 mg | ORAL_CAPSULE | Freq: Two times a day (BID) | ORAL | 0 refills | Status: DC

## 2016-09-21 MED ORDER — DIPHENHYDRAMINE HCL 25 MG PO CAPS: 25.0000 mg | ORAL_CAPSULE | Freq: Once | ORAL | 0 refills | Status: DC

## 2016-09-21 NOTE — MAU Provider Note (Signed)
History   pt in to triage states was given cipro yesterday for UTI and now is having a reaction with her lips itching. No rash, no difficulty breathing.   CSN: [id number]  Arrival date & time 09/21/16  1749   None     No chief complaint on file.   HPI  Past Medical History:  Diagnosis Date  . Alcohol abuse   . Anemia   . Bipolar 1 disorder (HCC)    No medications currently  . CAP (community acquired pneumonia) 03/17/2015  . 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 Mental disorder   . Obesity   . PICC line infection 05/17/2015  . Sepsis due to Gram negative bacteria (MDR E Coli) 02/18/2015  . SVD (spontaneous vaginal delivery)    x 1  . UTI (lower urinary tract infection)   . Vaginal Pap smear, abnormal   . Yeast infection of the vagina     Past Surgical History:  Procedure Laterality Date  . CERVICAL CONIZATION W/BX N/A 07/01/2016   Procedure: CONIZATION CERVIX WITH BIOPSY;  Surgeon: Chancy Milroy, MD;  Location: [facility];  Service: Gynecology;  Laterality: N/A;  . CHOLECYSTECTOMY    . HYSTEROSCOPY W/D&C N/A 01/25/2016   Procedure: DILATATION AND CURETTAGE /HYSTEROSCOPY;  Surgeon: Mora Bellman, MD;  Location: [facility];  Service: Gynecology;  Laterality: N/A;    No family history on file.  Social History  Substance Use Topics  . Smoking status: Never Smoker  . Smokeless tobacco: Never Used  . Alcohol use No    OB History    Gravida Para Term Preterm AB Living   1 1 1     1    SAB TAB Ectopic Multiple Live Births           1      Review of Systems  Constitutional: Negative.   HENT: Negative.        Lips itching  Eyes: Negative.   Respiratory: Negative.   Cardiovascular: Negative.   Gastrointestinal: Negative.   Endocrine: Negative.   Genitourinary: Negative.   Musculoskeletal: Negative.   Skin: Negative.   Allergic/Immunologic: Negative.   Neurological: Negative.   Hematological: Negative.   Psychiatric/Behavioral: Negative.      Allergies  Citalopram; Lamotrigine; Sulfa antibiotics; and Tramadol  Home Medications   Current Outpatient Rx  . Order #: [id number] Class: Normal  . Order #: [id number] Class: Normal    BP 121/93   Temp 97.6 F (36.4 C) (Oral)   Resp 18   LMP  (LMP Unknown)   SpO2 98%   Physical Exam  Constitutional: She is oriented to person, place, and time. She appears well-developed and well-nourished.  HENT:  Head: Normocephalic.  Cardiovascular: Normal rate and regular rhythm.   Pulmonary/Chest: Effort normal and breath sounds normal.  Abdominal: Soft.  Neurological: She is alert and oriented to person, place, and time. She has normal reflexes.  Skin: Skin is warm and dry.  Psychiatric: She has a normal mood and affect. Her behavior is normal. Judgment and thought content normal.    MAU Course  Procedures (including critical care time)  Labs Reviewed - No data to display Ct Abdomen Pelvis W Contrast  Result Date: 09/21/2016 CLINICAL DATA:  Acute onset of loose stools.  Initial encounter. EXAM: CT ABDOMEN AND PELVIS WITH CONTRAST TECHNIQUE: Multidetector CT imaging of the abdomen and pelvis was performed using the standard protocol following bolus administration of intravenous contrast. CONTRAST:  139mL ISOVUE-300 IOPAMIDOL (ISOVUE-300) INJECTION 61%  COMPARISON:  CT of the abdomen and pelvis performed 02/01/2016, and pelvic ultrasound performed 01/04/2016 FINDINGS: Lower chest: The visualized lung bases are grossly clear. The visualized portions of the mediastinum are unremarkable. Hepatobiliary: Calcified granulomata are noted within the liver. The liver is otherwise unremarkable. The patient is status post cholecystectomy, with a clip noted at the gallbladder fossa. The common bile duct remains normal in caliber. Pancreas: The pancreas is within normal limits. Spleen: The spleen is unremarkable in appearance. Adrenals/Urinary Tract: The adrenal glands are unremarkable in appearance. The  kidneys are within normal limits A small right renal cyst is noted. The kidneys are otherwise unremarkable. There is no evidence of hydronephrosis. No renal or ureteral stones are identified. No perinephric stranding is seen. Stomach/Bowel: The stomach is unremarkable in appearance. The small bowel is within normal limits. The appendix is normal in caliber, without evidence of appendicitis. The colon is largely decompressed and unremarkable in appearance. Vascular/Lymphatic: The abdominal aorta is unremarkable in appearance. The inferior vena cava is grossly unremarkable. No retroperitoneal lymphadenopathy is seen. No pelvic sidewall lymphadenopathy is identified. Reproductive: The bladder is relatively decompressed and not well assessed. The uterus is unremarkable. The ovaries are relatively symmetric. No suspicious adnexal masses are seen. Other: No additional soft tissue abnormalities are seen. Musculoskeletal: No acute osseous abnormalities are identified. The visualized musculature is unremarkable in appearance. IMPRESSION: 1. No acute abnormality seen to explain the patient's symptoms. 2. Small right renal cyst noted. Electronically Signed   By: Garald Balding M.D.   On: 09/21/2016 05:52     1. Antibiotics causing adverse effect in therapeutic use, subsequent encounter       MDM  Benadryl for itching, will stop cipro, start macrobid. D/c home

## 2016-09-21 NOTE — ED Provider Notes (Signed)
[facility] DEPT Provider Note   CSN: [id number] Arrival date & time: 09/20/16  2256  By signing my name below, I, Gwenlyn Fudge, attest that this documentation has been prepared under the direction and in the presence of Elnora [name], MD. Electronically Signed: Gwenlyn Fudge, ED Scribe. 09/21/16. 2:24 AM.   History   Chief Complaint Chief Complaint  Patient presents with  . Diarrhea   Pt reports gradual onset, episodic bright green stools.  No hx of diverticulitis, no Abdominal PSHx She reports associated pain with bowel movement, decreased appetite, diarrhea, abdominal pain (when having a bowel movement). She states stools smell like mold. She denies blood in stool No recent international travel. No new foods.   The history is provided by the patient. No language interpreter was used.  Diarrhea   This is a new problem. The current episode started 2 days ago. The problem has not changed since onset.The stool consistency is described as watery. There has been no fever. Associated symptoms include abdominal pain. Her past medical history does not include recent abdominal surgery.    Past Medical History:  Diagnosis Date  . Alcohol abuse   . Anemia   . Bipolar 1 disorder (HCC)    No medications currently  . CAP (community acquired pneumonia) 03/17/2015  . 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 Mental disorder   . Obesity   . PICC line infection 05/17/2015  . Sepsis due to Gram negative bacteria (MDR E Coli) 02/18/2015  . SVD (spontaneous vaginal delivery)    x 1  . UTI (lower urinary tract infection)   . Vaginal Pap smear, abnormal   . Yeast infection of the vagina     Patient Active Problem List   Diagnosis Date Noted  . Painful lumpy right breast 07/27/2016  . HSIL (high grade squamous intraepithelial lesion) on Pap smear of cervix 04/15/2016  . DUB (dysfunctional uterine bleeding)   . Diastolic dysfunction [id number]  . Constipation 05/12/2015    . ESBL (extended spectrum beta-lactamase) producing bacteria infection 03/17/2015  . Diarrhea 03/03/2015  . Weakness 03/02/2015  . 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 Generalized anxiety disorder 02/20/2015  . Claustrophobia 02/20/2015  . Transaminitis 02/18/2015  . Chest pain 02/18/2015  . Abdominal pain   . Dizziness   . Hypotension 01/04/2015  . Bipolar disorder ([place]) 06/29/2012    Past Surgical History:  Procedure Laterality Date  . CERVICAL CONIZATION W/BX N/A 07/01/2016   Procedure: CONIZATION CERVIX WITH BIOPSY;  Surgeon: Chancy Milroy, MD;  Location: [facility];  Service: Gynecology;  Laterality: N/A;  . CHOLECYSTECTOMY    . HYSTEROSCOPY W/D&C N/A 01/25/2016   Procedure: DILATATION AND CURETTAGE /HYSTEROSCOPY;  Surgeon: Mora Bellman, MD;  Location: [facility];  Service: Gynecology;  Laterality: N/A;    OB History    Gravida Para Term Preterm AB Living   1 1 1     1    SAB TAB Ectopic Multiple Live Births           1       Home Medications    Prior to Admission medications   Medication Sig Start Date End Date Taking? Authorizing Provider  ciprofloxacin (CIPRO) 500 MG tablet Take 1 tablet (500 mg total) by mouth 2 (two) times daily. 09/20/16   Luvenia Redden, PA-C  ibuprofen (ADVIL,MOTRIN) 600 MG tablet Take 1 tablet (600 mg total) by mouth every 6 (six) hours as needed. Patient not taking: Reported on 09/20/2016 07/27/16   [name]  A Leftwich-[name], CNM    Family History History reviewed. No pertinent family history.  Social History Social History  Substance Use Topics  . Smoking status: Never Smoker  . Smokeless tobacco: Never Used  . Alcohol use No     Allergies   Citalopram; Lamotrigine; Sulfa antibiotics; and Tramadol   Review of Systems Review of Systems  Gastrointestinal: Positive for abdominal pain and diarrhea.  All other systems reviewed and are negative.    Physical Exam Updated Vital Signs BP (!) 180/127 (BP Location: Right Arm)   Pulse 112    Temp 97.7 F (36.5 C) (Oral)   Resp 20   LMP  (LMP Unknown)   SpO2 95%   Physical Exam  Constitutional: She is oriented to person, place, and time. She appears well-developed and well-nourished. She is active. No distress.  HENT:  Head: Normocephalic and atraumatic.  Eyes: Conjunctivae are normal.  Cardiovascular: Normal rate.   Pulmonary/Chest: Effort normal. No respiratory distress.  Abdominal: There is tenderness.  Musculoskeletal: Normal range of motion.  Neurological: She is alert and oriented to person, place, and time.  Skin: Skin is warm and dry.  Psychiatric: She has a normal mood and affect. Her behavior is normal.  Nursing note and vitals reviewed.    ED Treatments / Results  DIAGNOSTIC STUDIES: Oxygen Saturation is 95% on RA, adequate by my interpretation.    COORDINATION OF CARE: 2:24 AM Discussed treatment plan with pt at bedside which includes blood work and CT Scan and pt agreed to plan.  Labs (all labs ordered are listed, but only abnormal results are displayed) Labs Reviewed - No data to display  EKG  EKG Interpretation None       Radiology No results found.  Procedures Procedures (including critical care time)  Medications Ordered in ED Medications - No data to display   Initial Impression / Assessment and Plan / ED Course  I have reviewed the triage vital signs and the nursing notes.  Pertinent labs & imaging results that were available during my care of the patient were reviewed by me and considered in my medical decision making (see chart for details).  Clinical Course    Patient presents with worsening lower abdominal pain and diarrhea. Discussed likely toxin/viral mediated versus colitis/diverticulitis. Patient worsening symptoms and preferred for imaging at this time instead of recheck in 48 hours. CT scan no acute findings. Vitals and labs stable unremarkable. Patient stable for outpatient follow. Results and differential diagnosis  were discussed with the patient/parent/guardian. Xrays were independently reviewed by myself.  Close follow up outpatient was discussed, comfortable with the plan.   Medications  fentaNYL (SUBLIMAZE) injection 50 mcg (50 mcg Intravenous Given 09/21/16 0349)  sodium chloride 0.9 % bolus 1,000 mL (1,000 mLs Intravenous New Bag/Given 09/21/16 0346)  iopamidol (ISOVUE-300) 61 % injection 100 mL (100 mLs Intravenous Contrast Given 09/21/16 0529)    Vitals:   09/20/16 2327 09/21/16 0229  BP: (!) 180/127 135/73  Pulse: 112 96  Resp: 20 19  Temp: 97.7 F (36.5 C)   TempSrc: Oral   SpO2: 95% 100%    Final diagnoses:  Diarrhea, unspecified type  Abdominal cramping    Final Clinical Impressions(s) / ED Diagnoses   Final diagnoses:  None    New Prescriptions New Prescriptions   No medications on file     Elnora [name], MD 09/21/16 [phone]

## 2016-09-21 NOTE — Discharge Instructions (Signed)
If you were given medicines take as directed.  If you are on coumadin or contraceptives realize their levels and effectiveness is altered by many different medicines.  If you have any reaction (rash, tongues swelling, other) to the medicines stop taking and see a physician.    If your blood pressure was elevated in the ER make sure you follow up for management with a primary doctor or return for chest pain, shortness of breath or stroke symptoms.  Please follow up as directed and return to the ER or see a physician for new or worsening symptoms.  Thank you. Vitals:   09/20/16 2327 09/21/16 0229  BP: (!) 180/127 135/73  Pulse: 112 96  Resp: 20 19  Temp: 97.7 F (36.5 C)   TempSrc: Oral   SpO2: 95% 100%

## 2016-09-21 NOTE — MAU Note (Signed)
Patient was given Cipro took first dose this afternoon, lips started itching, no shortness of breath.

## 2016-09-21 NOTE — Discharge Instructions (Signed)
Drug Rash Introduction A drug rash is a change in the color or texture of the skin that is caused by a drug. It can develop minutes, hours, or days after the person takes the drug. What are the causes? This condition is usually caused by a drug allergy. It can also be caused by exposure to sunlight after taking a drug that makes the skin sensitive to light. Drugs that commonly cause rashes include:  Penicillin.  Antibiotic medicines.  Medicines that treat seizures.  Medicines that treat cancer (chemotherapy).  Aspirin and other nonsteroidal anti-inflammatory drugs (NSAIDs).  Injectable dyes that contain iodine.  Insulin. What are the signs or symptoms? Symptoms of this condition include:  Redness.  Tiny bumps.  Peeling.  Itching.  Itchy welts (hives).  Swelling. The rash may appear on a small area of skin or all over the body. How is this diagnosed? To diagnose the condition, your health care provider will do a physical exam. He or she may also order tests to find out which drug caused the rash. Tests to find the cause of a rash include:  Skin tests.  Blood tests.  Drug challenge. For this test, you stop taking all of the drugs that you do not need to take, and then you start taking them again by adding back one of the drugs at a time. How is this treated? A drug rash may be treated with medicines, including:  Antihistamines. These may be given to relieve itching.  An NSAID. This may be given to reduce swelling and treat pain.  A steroid drug. This may be given to reduce swelling. The rash usually goes away when the person stops taking the drug that caused it. Follow these instructions at home:  Take medicines only as directed by your health care provider.  Let all of your health care providers know about any drug reactions you have had in the past.  If you have hives, take a cool shower or use a cool compress to relieve itchiness. Contact a health care  provider if:  You have a fever.  Your rash is not going away.  Your rash gets worse.  Your rash comes back.  You have wheezing or coughing. Get help right away if:  You start to have breathing problems.  You start to have shortness of breath.  You face or throat starts to swell.  You have severe weakness with dizziness or fainting.  You have chest pain. This information is not intended to replace advice given to you by your health care provider. Make sure you discuss any questions you have with your health care provider. Document Released: 11/14/2004 Document Revised: 03/14/2016 Document Reviewed: 08/03/2014  2017 Elsevier

## 2016-09-22 ENCOUNTER — Encounter (HOSPITAL_COMMUNITY): Payer: Self-pay | Admitting: Emergency Medicine

## 2016-09-22 ENCOUNTER — Emergency Department (HOSPITAL_COMMUNITY)
Admission: EM | Admit: 2016-09-22 | Discharge: 2016-09-22 | Disposition: A | Discharge status: Home / Self Care | Payer: Medicaid Other | Attending: Emergency Medicine | Admitting: Emergency Medicine

## 2016-09-22 DIAGNOSIS — N390 Urinary tract infection, site not specified: Secondary | ICD-10-CM | POA: Diagnosis not present

## 2016-09-22 DIAGNOSIS — Z79899 Other long term (current) drug therapy: Secondary | ICD-10-CM | POA: Insufficient documentation

## 2016-09-22 DIAGNOSIS — R197 Diarrhea, unspecified: Secondary | ICD-10-CM | POA: Insufficient documentation

## 2016-09-22 LAB — COMPREHENSIVE METABOLIC PANEL
ALT: 38 U/L (ref 14–54)
AST: 37 U/L (ref 15–41)
Albumin: 4.5 g/dL (ref 3.5–5.0)
Alkaline Phosphatase: 103 U/L (ref 38–126)
Anion gap: 9 (ref 5–15)
BUN: 10 mg/dL (ref 6–20)
CO2: 26 mmol/L (ref 22–32)
Calcium: 9 mg/dL (ref 8.9–10.3)
Chloride: 106 mmol/L (ref 101–111)
Creatinine, Ser: 0.84 mg/dL (ref 0.44–1.00)
GFR calc Af Amer: >60 mL/min (ref >60)
GFR calc non Af Amer: >60 mL/min (ref >60)
Glucose, Bld: 97 mg/dL (ref 65–99)
Potassium: 3.3 mmol/L — ABNORMAL LOW (ref 3.5–5.1)
Sodium: 141 mmol/L (ref 135–145)
Total Bilirubin: 1.6 mg/dL — ABNORMAL HIGH (ref 0.3–1.2)
Total Protein: 7.2 g/dL (ref 6.5–8.1)

## 2016-09-22 LAB — CBC WITH DIFFERENTIAL/PLATELET
Basophils Absolute: 0 10*3/uL (ref 0.0–0.1)
Basophils Relative: 0 %
Eosinophils Absolute: 0 10*3/uL (ref 0.0–0.7)
Eosinophils Relative: 0 %
HCT: 43.6 % (ref 36.0–46.0)
Hemoglobin: 15 g/dL (ref 12.0–15.0)
Lymphocytes Relative: 23 %
Lymphs Abs: 1.5 10*3/uL (ref 0.7–4.0)
MCH: 30.1 pg (ref 26.0–34.0)
MCHC: 34.4 g/dL (ref 30.0–36.0)
MCV: 87.6 fL (ref 78.0–100.0)
Monocytes Absolute: 0.7 10*3/uL (ref 0.1–1.0)
Monocytes Relative: 11 %
Neutro Abs: 4.4 10*3/uL (ref 1.7–7.7)
Neutrophils Relative %: 66 %
Platelets: 205 10*3/uL (ref 150–400)
RBC: 4.98 MIL/uL (ref 3.87–5.11)
RDW: 13.3 % (ref 11.5–15.5)
WBC: 6.6 10*3/uL (ref 4.0–10.5)

## 2016-09-22 LAB — URINE MICROSCOPIC-ADD ON

## 2016-09-22 LAB — URINALYSIS, ROUTINE W REFLEX MICROSCOPIC
Glucose, UA: NEGATIVE mg/dL
Ketones, ur: NEGATIVE mg/dL
Nitrite: POSITIVE — AB
Protein, ur: 30 mg/dL — AB
Specific Gravity, Urine: 1.039 — ABNORMAL HIGH (ref 1.005–1.030)
pH: 6 (ref 5.0–8.0)

## 2016-09-22 MED ORDER — LOPERAMIDE HCL 2 MG PO CAPS: 2.0000 mg | ORAL_CAPSULE | Freq: Four times a day (QID) | ORAL | 0 refills | Status: DC | PRN

## 2016-09-22 NOTE — ED Triage Notes (Signed)
Patient c/o abd pain and diarrhea for couple weeks. Patient states that she has been seen here for same symptoms.

## 2016-09-22 NOTE — ED Notes (Signed)
Pt has not had any diarrhea during ED stay. MD informed

## 2016-09-22 NOTE — ED Provider Notes (Signed)
[facility] DEPT Provider Note   CSN: [id number] Arrival date & time: 09/22/16  1533     History   Chief Complaint Chief Complaint  Patient presents with  . Diarrhea    HPI Vyonne Narro is a 50 y.o. female.  HPI  Patient presents with concern of diarrhea, lower abdominal pain. Symptoms began 2 days ago. Prior to that she had no changes in activity, travel, medication, diet. Since onset she has had crampy lower abdominal pain, multiple episodes of green malodorous diarrhea. She was seen here 2 days ago, had labs, CT scan. Only notable abnormality was evidence for urinary tract infection. Patient started antibiotics, but after developing paresthesia in her lips, saw her primary care physician and was switched to [place]. She has no urinary complaints currently. She also denies fever, chills, confusion, disorientation, chest pain, dyspnea. No relief with Pepto-Bismol.   Past Medical History:  Diagnosis Date  . Alcohol abuse   . Anemia   . Bipolar 1 disorder (HCC)    No medications currently  . CAP (community acquired pneumonia) 03/17/2015  . 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 Mental disorder   . Obesity   . PICC line infection 05/17/2015  . Sepsis due to Gram negative bacteria (MDR E Coli) 02/18/2015  . SVD (spontaneous vaginal delivery)    x 1  . UTI (lower urinary tract infection)   . Vaginal Pap smear, abnormal   . Yeast infection of the vagina     Patient Active Problem List   Diagnosis Date Noted  . Painful lumpy right breast 07/27/2016  . HSIL (high grade squamous intraepithelial lesion) on Pap smear of cervix 04/15/2016  . DUB (dysfunctional uterine bleeding)   . Diastolic dysfunction [id number]  . Constipation 05/12/2015  . ESBL (extended spectrum beta-lactamase) producing bacteria infection 03/17/2015  . Diarrhea 03/03/2015  . Weakness 03/02/2015  . 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 Generalized anxiety disorder  02/20/2015  . Claustrophobia 02/20/2015  . Transaminitis 02/18/2015  . Chest pain 02/18/2015  . Abdominal pain   . Dizziness   . Hypotension 01/04/2015  . Bipolar disorder ([place]) 06/29/2012    Past Surgical History:  Procedure Laterality Date  . CERVICAL CONIZATION W/BX N/A 07/01/2016   Procedure: CONIZATION CERVIX WITH BIOPSY;  Surgeon: Chancy Milroy, MD;  Location: [facility];  Service: Gynecology;  Laterality: N/A;  . CHOLECYSTECTOMY    . HYSTEROSCOPY W/D&C N/A 01/25/2016   Procedure: DILATATION AND CURETTAGE /HYSTEROSCOPY;  Surgeon: Mora Bellman, MD;  Location: [facility];  Service: Gynecology;  Laterality: N/A;    OB History    Gravida Para Term Preterm AB Living   1 1 1     1    SAB TAB Ectopic Multiple Live Births           1       Home Medications    Prior to Admission medications   Medication Sig Start Date End Date Taking? Authorizing Provider  diphenhydrAMINE (BENADRYL) 25 mg capsule Take 1 capsule (25 mg total) by mouth once. 09/21/16 09/21/16  Keitha Butte, CNM  ibuprofen (ADVIL,MOTRIN) 600 MG tablet Take 1 tablet (600 mg total) by mouth every 6 (six) hours as needed. Patient not taking: Reported on 09/21/2016 07/27/16   Kathie Dike Leftwich-[name], CNM  nitrofurantoin, macrocrystal-monohydrate, (MACROBID) 100 MG capsule Take 1 capsule (100 mg total) by mouth 2 (two) times daily. 09/21/16   Keitha Butte, CNM    Family History No family history on file.  Social History Social History  Substance Use Topics  . Smoking status: Never Smoker  . Smokeless tobacco: Never Used  . Alcohol use No     Allergies   Citalopram; Lamotrigine; Sulfa antibiotics; and Tramadol   Review of Systems Review of Systems  Constitutional:       Per HPI, otherwise negative  HENT:       Per HPI, otherwise negative  Respiratory:       Per HPI, otherwise negative  Cardiovascular:       Per HPI, otherwise negative  Gastrointestinal: Positive for abdominal pain, diarrhea and nausea.  Negative for vomiting.  Endocrine:       Negative aside from HPI  Genitourinary:       Neg aside from HPI   Musculoskeletal:       Per HPI, otherwise negative  Skin: Negative.   Neurological: Negative for syncope.     Physical Exam Updated Vital Signs BP (!) 159/104 (BP Location: Right Arm)   Pulse 88   Temp 98.8 F (37.1 C) (Oral)   Resp 18   LMP  (LMP Unknown)   SpO2 98%   Physical Exam  Constitutional: She is oriented to person, place, and time. She appears well-developed and well-nourished. No distress.  HENT:  Head: Normocephalic and atraumatic.  Eyes: Conjunctivae and EOM are normal.  Cardiovascular: Normal rate and regular rhythm.   Pulmonary/Chest: Effort normal and breath sounds normal. No stridor. No respiratory distress.  Abdominal: She exhibits no distension.  Mild tenderness to palpation throughout the lower abdomen bilaterally, no peritoneal findings  Musculoskeletal: She exhibits no edema.  Neurological: She is alert and oriented to person, place, and time. No cranial nerve deficit.  Skin: Skin is warm and dry.  Psychiatric: She has a normal mood and affect.  Nursing note and vitals reviewed.    ED Treatments / Results  Labs (all labs ordered are listed, but only abnormal results are displayed) Labs Reviewed  C DIFFICILE QUICK SCREEN W PCR REFLEX  COMPREHENSIVE METABOLIC PANEL  CBC WITH DIFFERENTIAL/PLATELET  URINALYSIS, ROUTINE W REFLEX MICROSCOPIC (NOT AT [facility])     Radiology Ct Abdomen Pelvis W Contrast  Result Date: 09/21/2016 CLINICAL DATA:  Acute onset of loose stools.  Initial encounter. EXAM: CT ABDOMEN AND PELVIS WITH CONTRAST TECHNIQUE: Multidetector CT imaging of the abdomen and pelvis was performed using the standard protocol following bolus administration of intravenous contrast. CONTRAST:  160mL ISOVUE-300 IOPAMIDOL (ISOVUE-300) INJECTION 61% COMPARISON:  CT of the abdomen and pelvis performed 02/01/2016, and pelvic ultrasound  performed 01/04/2016 FINDINGS: Lower chest: The visualized lung bases are grossly clear. The visualized portions of the mediastinum are unremarkable. Hepatobiliary: Calcified granulomata are noted within the liver. The liver is otherwise unremarkable. The patient is status post cholecystectomy, with a clip noted at the gallbladder fossa. The common bile duct remains normal in caliber. Pancreas: The pancreas is within normal limits. Spleen: The spleen is unremarkable in appearance. Adrenals/Urinary Tract: The adrenal glands are unremarkable in appearance. The kidneys are within normal limits A small right renal cyst is noted. The kidneys are otherwise unremarkable. There is no evidence of hydronephrosis. No renal or ureteral stones are identified. No perinephric stranding is seen. Stomach/Bowel: The stomach is unremarkable in appearance. The small bowel is within normal limits. The appendix is normal in caliber, without evidence of appendicitis. The colon is largely decompressed and unremarkable in appearance. Vascular/Lymphatic: The abdominal aorta is unremarkable in appearance. The inferior vena cava is grossly unremarkable. No retroperitoneal  lymphadenopathy is seen. No pelvic sidewall lymphadenopathy is identified. Reproductive: The bladder is relatively decompressed and not well assessed. The uterus is unremarkable. The ovaries are relatively symmetric. No suspicious adnexal masses are seen. Other: No additional soft tissue abnormalities are seen. Musculoskeletal: No acute osseous abnormalities are identified. The visualized musculature is unremarkable in appearance. IMPRESSION: 1. No acute abnormality seen to explain the patient's symptoms. 2. Small right renal cyst noted. Electronically Signed   By: Garald Balding M.D.   On: 09/21/2016 05:52   CT scan from 2 days ago, labs reviewed, essentially unremarkable.  Procedures Procedures (including critical care time)  Medications Ordered in ED Medications -  No data to display   Initial Impression / Assessment and Plan / ED Course  I have reviewed the triage vital signs and the nursing notes.  Pertinent labs & imaging results that were available during my care of the patient were reviewed by me and considered in my medical decision making (see chart for details).  Clinical Course     9:08 PM After-hours of monitoring, patient has had no additional bowel movements, low suspicion for acute C. difficile or other diarrheal illness. Vital signs are unremarkable. Labs reviewed with patient, who has no ongoing complaints, including no abdominal pain. I discussed the importance of taking her new antibiotic, the opportunity to use Imodium if diarrhea recurs, and the importance of following up with primary care. With reassuring CT scan from 2 days ago, labs today, no evidence for distress, nor ongoing pain, patient is appropriate for discharge with low suspicion for peritonitis or other acute new pathology beyond urinary tract infection.   Final Clinical Impressions(s) / ED Diagnoses   Final diagnoses:  Diarrhea, unspecified type  Urinary tract infection without hematuria, site unspecified    New Prescriptions New Prescriptions   LOPERAMIDE (IMODIUM) 2 MG CAPSULE    Take 1 capsule (2 mg total) by mouth 4 (four) times daily as needed for diarrhea or loose stools.     Carmin Muskrat, MD 09/22/16 2109

## 2016-09-22 NOTE — ED Notes (Signed)
c-diff stool sample not collected. Patient had no BM since here in ED. Patient states she feels much better and is ready to go home.

## 2016-09-22 NOTE — ED Notes (Signed)
Patient finishing up meal that was provided, states she is feeling great. Patient continues to be unable to provide stool sample. Informed her that MD was notified, and she is waiting on updated on dc.

## 2016-09-22 NOTE — Discharge Instructions (Signed)
As discussed, your evaluation today has been largely reassuring.  But, it is important that you monitor your condition carefully, and do not hesitate to return to the ED if you develop new, or concerning changes in your condition. ? ?Otherwise, please follow-up with your physician for appropriate ongoing care. ? ?

## 2016-09-23 ENCOUNTER — Other Ambulatory Visit: Payer: Self-pay | Admitting: Medical

## 2016-09-23 DIAGNOSIS — N390 Urinary tract infection, site not specified: Secondary | ICD-10-CM

## 2016-09-23 LAB — URINE CULTURE: Culture: >=100000 — AB

## 2016-09-27 IMAGING — CR DG CHEST 2V
1 series · 1 of 1 positions shown · non-contrast
Comparison: 05/16/2014

CLINICAL DATA: Chest pain and shortness of Breath

EXAM:
CHEST  2 VIEW

[w chest lat *]
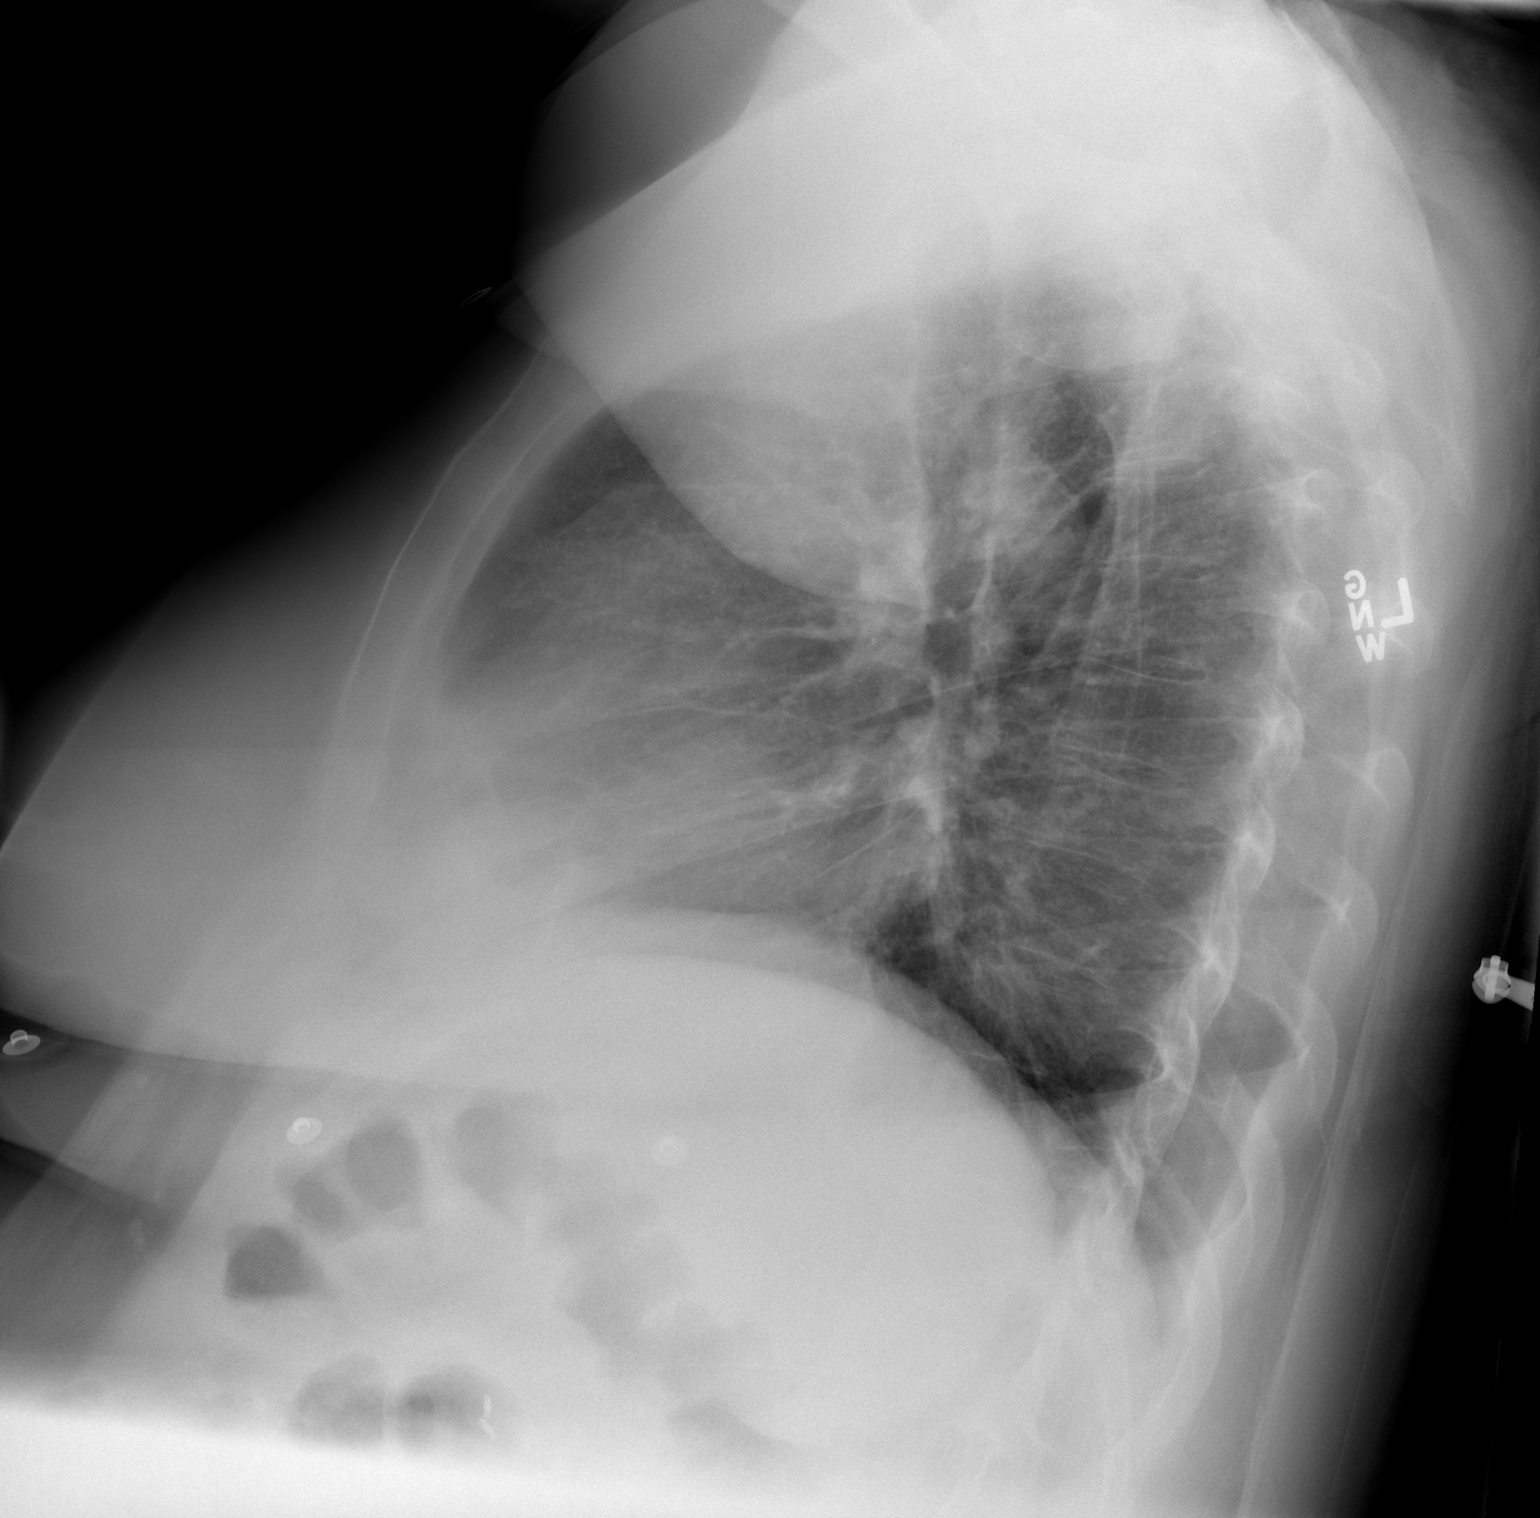

[1 of 1 positions shown; findings below may reference images not displayed]

FINDINGS: The heart size appears normal. There is no pleural effusion or
edema. Lung volumes appear low. No airspace consolidation
identified. The visualized osseous structures are on unremarkable.
IMPRESSION: 1. Low lung volumes.

## 2016-09-30 ENCOUNTER — Telehealth: Payer: Self-pay | Admitting: General Practice

## 2016-09-30 NOTE — Telephone Encounter (Signed)
Per Kerry Hough, patient needs referral to urology for recurrent UTIs. Scheduled appt with [facility] 1/25 @ 930 with Dr Karsten Ro. Called and informed patient that her gynecologist has recommended this appt due to her recurent UTIs. Provided office phone number and patient asked if the appt had already been made and I stated yes in January and then patient hung up the phone to call [facility]

## 2016-10-29 ENCOUNTER — Telehealth: Payer: Self-pay

## 2016-10-29 NOTE — Telephone Encounter (Signed)
Pt called and stated that she has a strong odor and wants to know what to do?

## 2016-10-29 NOTE — Telephone Encounter (Signed)
Patient called into front office stating she is having a vaginal odor like a blood smell. Patient denies discharge, pain, itching, or irritation. Told patient I was uncertain what could be causing the odor and that she may wait until her urology appt or she can come in for a urine culture. Patient verbalized understanding and states she would like to come in Friday morning at 9am. Patient had no other questions

## 2016-11-01 ENCOUNTER — Ambulatory Visit: Payer: Self-pay

## 2016-11-04 ENCOUNTER — Ambulatory Visit: Payer: Self-pay

## 2016-11-05 ENCOUNTER — Ambulatory Visit: Payer: Self-pay

## 2016-11-08 ENCOUNTER — Ambulatory Visit: Payer: Self-pay

## 2016-11-20 ENCOUNTER — Telehealth: Payer: Self-pay | Admitting: *Deleted

## 2016-11-20 DIAGNOSIS — B9689 Other specified bacterial agents as the cause of diseases classified elsewhere: Secondary | ICD-10-CM

## 2016-11-20 DIAGNOSIS — N760 Acute vaginitis: Secondary | ICD-10-CM

## 2016-11-20 MED ORDER — METRONIDAZOLE 500 MG PO TABS: 500.0000 mg | ORAL_TABLET | Freq: Two times a day (BID) | ORAL | 0 refills | Status: DC

## 2016-11-20 NOTE — Telephone Encounter (Signed)
Returned patient's call. She is concerned about a "strong bloody odor" from her vagina. Does report yellow discharge as well. No bleeding or pain. She has an appointment Feb 21 but does not feel she can wait that long. I offered to have her come in for a nurse visit where we could do a swab but she has transportation issues and has to schedule medicaid transport. Per protocol I sent in flagyl for presumed bacterial vaginosis. Let patient know that she can try this first and if there is no relief that it could be addressed at her upcoming visit. Patient voiced understanding.

## 2016-11-20 NOTE — Telephone Encounter (Signed)
Pt left message yesterday stating that she is having a "strong bloody odor down there". She requests a call back.

## 2016-11-21 NOTE — Telephone Encounter (Signed)
Error

## 2016-11-25 ENCOUNTER — Ambulatory Visit: Payer: Self-pay | Admitting: Obstetrics and Gynecology

## 2016-12-04 ENCOUNTER — Ambulatory Visit: Payer: Self-pay

## 2016-12-11 ENCOUNTER — Ambulatory Visit (INDEPENDENT_AMBULATORY_CARE_PROVIDER_SITE_OTHER): Payer: Medicaid Other | Admitting: Obstetrics and Gynecology

## 2016-12-11 ENCOUNTER — Encounter: Payer: Self-pay | Admitting: Obstetrics and Gynecology

## 2016-12-11 ENCOUNTER — Other Ambulatory Visit (HOSPITAL_COMMUNITY)
Admission: RE | Admit: 2016-12-11 | Discharge: 2016-12-11 | Disposition: A | Discharge status: Home / Self Care | Payer: Medicaid Other | Source: Ambulatory Visit | Attending: Obstetrics and Gynecology | Admitting: Obstetrics and Gynecology

## 2016-12-11 VITALS — BP 131/94 | HR 100 | Wt 363.5 lb

## 2016-12-11 DIAGNOSIS — Z87898 Personal history of other specified conditions: Secondary | ICD-10-CM | POA: Diagnosis not present

## 2016-12-11 DIAGNOSIS — Z1151 Encounter for screening for human papillomavirus (HPV): Secondary | ICD-10-CM | POA: Diagnosis not present

## 2016-12-11 DIAGNOSIS — Z01411 Encounter for gynecological examination (general) (routine) with abnormal findings: Secondary | ICD-10-CM | POA: Diagnosis present

## 2016-12-11 DIAGNOSIS — Z8742 Personal history of other diseases of the female genital tract: Secondary | ICD-10-CM

## 2016-12-11 NOTE — Patient Instructions (Signed)
Menopause Menopause is the normal time of life when menstrual periods stop completely. Menopause is complete when you have missed 12 consecutive menstrual periods. It usually occurs between the ages of 48 years and 55 years. Very rarely does a woman develop menopause before the age of 40 years. At menopause, your ovaries stop producing the female hormones estrogen and progesterone. This can cause undesirable symptoms and also affect your health. Sometimes the symptoms may occur 4-5 years before the menopause begins. There is no relationship between menopause and:  Oral contraceptives.  Number of children you had.  Race.  The age your menstrual periods started (menarche).  Heavy smokers and very thin women may develop menopause earlier in life. What are the causes?  The ovaries stop producing the female hormones estrogen and progesterone. Other causes include:  Surgery to remove both ovaries.  The ovaries stop functioning for no known reason.  Tumors of the pituitary gland in the brain.  Medical disease that affects the ovaries and hormone production.  Radiation treatment to the abdomen or pelvis.  Chemotherapy that affects the ovaries.  What are the signs or symptoms?  Hot flashes.  Night sweats.  Decrease in sex drive.  Vaginal dryness and thinning of the vagina causing painful intercourse.  Dryness of the skin and developing wrinkles.  Headaches.  Tiredness.  Irritability.  Memory problems.  Weight gain.  Bladder infections.  Hair growth of the face and chest.  Infertility. More serious symptoms include:  Loss of bone (osteoporosis) causing breaks (fractures).  Depression.  Hardening and narrowing of the arteries (atherosclerosis) causing heart attacks and strokes.  How is this diagnosed?  When the menstrual periods have stopped for 12 straight months.  Physical exam.  Hormone studies of the blood. How is this treated? There are many treatment  choices and nearly as many questions about them. The decisions to treat or not to treat menopausal changes is an individual choice made with your health care provider. Your health care provider can discuss the treatments with you. Together, you can decide which treatment will work best for you. Your treatment choices may include:  Hormone therapy (estrogen and progesterone).  Non-hormonal medicines.  Treating the individual symptoms with medicine (for example antidepressants for depression).  Herbal medicines that may help specific symptoms.  Counseling by a psychiatrist or psychologist.  Group therapy.  Lifestyle changes including: ? Eating healthy. ? Regular exercise. ? Limiting caffeine and alcohol. ? Stress management and meditation.  No treatment.  Follow these instructions at home:  Take the medicine your health care provider gives you as directed.  Get plenty of sleep and rest.  Exercise regularly.  Eat a diet that contains calcium (good for the bones) and soy products (acts like estrogen hormone).  Avoid alcoholic beverages.  Do not smoke.  If you have hot flashes, dress in layers.  Take supplements, calcium, and vitamin D to strengthen bones.  You can use over-the-counter lubricants or moisturizers for vaginal dryness.  Group therapy is sometimes very helpful.  Acupuncture may be helpful in some cases. Contact a health care provider if:  You are not sure you are in menopause.  You are having menopausal symptoms and need advice and treatment.  You are still having menstrual periods after age 55 years.  You have pain with intercourse.  Menopause is complete (no menstrual period for 12 months) and you develop vaginal bleeding.  You need a referral to a specialist (gynecologist, psychiatrist, or psychologist) for treatment. Get help right   away if:  You have severe depression.  You have excessive vaginal bleeding.  You fell and think you have a  broken bone.  You have pain when you urinate.  You develop leg or chest pain.  You have a fast pounding heart beat (palpitations).  You have severe headaches.  You develop vision problems.  You feel a lump in your breast.  You have abdominal pain or severe indigestion. This information is not intended to replace advice given to you by your health care provider. Make sure you discuss any questions you have with your health care provider. Document Released: 12/28/2003 Document Revised: 03/14/2016 Document Reviewed: 05/06/2013 Elsevier Interactive Patient Education  2017 [employer]vier Inc[employer]  

## 2016-12-11 NOTE — Progress Notes (Signed)
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 She has seen Urology for her recurrent UTI's. She is on Macrobid prophylactic and Premarin cream She has had not cycle for 6 months, having menopausal Sx. Vaginal d/c with odor as well. Pain with intercourse.  PE AF VSS GU Nl EGBUS, mild vagina atrophy notes, brown discharge noted, cervix friable, no discrete lesion noted  A/P HGSIL        Menopausal Sx  Repeat pap completed today. Will schedule repeat in 4 months. Will evaluate discharge with pap smear. Discussed menopausal Sx and treatment options. R/B of hormonal and non hormonal reviewed. She is undecided at this time. She will check insurance coverage in the mean time.

## 2016-12-13 ENCOUNTER — Ambulatory Visit
Admission: RE | Admit: 2016-12-13 | Discharge: 2016-12-13 | Disposition: A | Discharge status: Home / Self Care | Payer: Medicaid Other | Source: Ambulatory Visit | Attending: Obstetrics and Gynecology | Admitting: Obstetrics and Gynecology

## 2016-12-13 DIAGNOSIS — Z1239 Encounter for other screening for malignant neoplasm of breast: Secondary | ICD-10-CM

## 2016-12-16 LAB — CYTOLOGY - PAP
Diagnosis: HIGH — AB
HPV: DETECTED

## 2016-12-17 ENCOUNTER — Telehealth: Payer: Self-pay | Admitting: *Deleted

## 2016-12-17 NOTE — Telephone Encounter (Signed)
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  Will forward to Dr. Rip Harbour. Needs colposcopy

## 2016-12-18 NOTE — Telephone Encounter (Signed)
Pt called office requesting Pap results. I advised her of the results and that Colposcopy will be needed. After a lengthy discussion of the procedure and implication of abnormal Pap, pt voiced understanding. She will be contacted with an appt by our scheduling staff. She stated that a message can be left on her voice mail if she does not answer.

## 2017-01-03 ENCOUNTER — Encounter: Payer: Self-pay | Admitting: Obstetrics and Gynecology

## 2017-01-03 ENCOUNTER — Ambulatory Visit (INDEPENDENT_AMBULATORY_CARE_PROVIDER_SITE_OTHER): Payer: Medicaid Other | Admitting: Obstetrics and Gynecology

## 2017-01-03 VITALS — BP 112/83 | HR 96 | Ht 68.0 in | Wt 363.3 lb

## 2017-01-03 DIAGNOSIS — R87613 High grade squamous intraepithelial lesion on cytologic smear of cervix (HGSIL): Secondary | ICD-10-CM

## 2017-01-03 NOTE — Progress Notes (Signed)
Pt here to discuss last pap smear results. Pt with H/O HGSIL, inadequate colop. Underwent CKC returned as HGSIL/CIS. Pt desired to follow with pap smears. First pap smear last month returned as HGSIL  Treatment options reviewed with pt. Including repeat colop and treatment as per colpo results ot proceed to hysterectomy.   R/B of each option reviewed. Following discussion, pt has elected to proceed with hysterectomy. Pt has had TSVD x 1 @ 6#.  PE AF VSS Morbid obese female  Lungs clear Heart RRR Abd soft + BS obese GU nl EGBUS, atrophic vaginal mucosa, some stenos to vaginal canal, uterus small mobile exam limited by pt habitus  A/P Abnormal pap smear  Will proceed with  TVH/BS. I think this is the best approach for the pt. Her size and vaginal stenos does present a degree of difficult. Discussed with pt possibility of having to convert to laparoscopy and or abdominal approach. She verbalized understanding.  R/B/Post OP care reviewed with pt. Will schedule.

## 2017-01-03 NOTE — Patient Instructions (Signed)
Vaginal Hysterectomy A vaginal hysterectomy is a procedure to remove all or part of the uterus through a small incision in the vagina. In this procedure, your health care provider may remove your entire uterus, including the lower end (cervix). You may need a vaginal hysterectomy to treat:  Uterine fibroids.  A condition that causes the lining of the uterus to grow in other areas (endometriosis).  Problems with pelvic support.  Cancer of the cervix, ovaries, uterus, or tissue that lines the uterus (endometrium).  Excessive (dysfunctional) uterine bleeding. When removing your uterus, your health care provider may also remove the organs that produce eggs (ovaries) and the tubes that carry eggs to your uterus (fallopian tubes). After a vaginal hysterectomy, you will no longer be able to have a baby. You will also no longer get your menstrual period. Tell a health care provider about:  Any allergies you have.  All medicines you are taking, including vitamins, herbs, eye drops, creams, and over-the-counter medicines.  Any problems you or family members have had with anesthetic medicines.  Any blood disorders you have.  Any surgeries you have had.  Any medical conditions you have.  Whether you are pregnant or may be pregnant. What are the risks? Generally, this is a safe procedure. However, problems may occur, including:  Bleeding.  Infection.  A blood clot that forms in your leg and travels to your lungs (pulmonary embolism).  Damage to surrounding organs.  Pain during sex. What happens before the procedure?  Ask your health care provider what organs will be removed during surgery.  Ask your health care provider about:  Changing or stopping your regular medicines. This is especially important if you are taking diabetes medicines or blood thinners.  Taking medicines such as aspirin and ibuprofen. These medicines can thin your blood. Do not take these medicines before your  procedure if your health care provider instructs you not to.  Follow instructions from your health care provider about eating or drinking restrictions.  Do not use any tobacco products, such as cigarettes, chewing tobacco, and e-cigarettes. If you need help quitting, ask your health care provider.  Plan to have someone take you home after discharge from the hospital. What happens during the procedure?  To reduce your risk of infection:  Your health care team will wash or sanitize their hands.  Your skin will be washed with soap.  An IV tube will be inserted into one of your veins.  You may be given antibiotic medicine to help prevent infection.  You will be given one or more of the following:  A medicine to help you relax (sedative).  A medicine to numb the area (local anesthetic).  A medicine to make you fall asleep (general anesthetic).  A medicine that is injected into an area of your body to numb everything beyond the injection site (regional anesthetic).  Your surgeon will make an incision in your vagina.  Your surgeon will locate and remove all or part of your uterus.  Your ovaries and fallopian tubes may be removed at the same time.  The incision will be closed with stitches (sutures) that dissolve over time. The procedure may vary among health care providers and hospitals. What happens after the procedure?  Your blood pressure, heart rate, breathing rate, and blood oxygen level will be monitored often until the medicines you were given have worn off.  You will be encouraged to get up and walk around after a few hours to help prevent complications.  You may have IV tubes in place for a few days.  You will be given pain medicine as needed.  Do not drive for 24 hours if you were given a sedative. This information is not intended to replace advice given to you by your health care provider. Make sure you discuss any questions you have with your health care  provider. Document Released: 01/29/2016 Document Revised: 03/14/2016 Document Reviewed: 10/22/2015 Elsevier Interactive Patient Education  2017 [employer].

## 2017-01-06 ENCOUNTER — Telehealth: Payer: Self-pay | Admitting: *Deleted

## 2017-01-06 NOTE — Telephone Encounter (Signed)
Anaira left a message this am stating she came in last week to see gynecologist and he said she would need surgery. Wants to know if we will set up surgery or someone calling her? Wants a call back.

## 2017-01-08 ENCOUNTER — Encounter (HOSPITAL_COMMUNITY): Payer: Self-pay

## 2017-01-08 NOTE — Telephone Encounter (Signed)
I called Jordan [name] and we discussed she would get a letter telling her when her surgery is scheduled and any instructions including if she has preop appointments. ; but she may not get the letter for up to  A month from now as it takes time to get it scheduled. She voices understanding.

## 2017-02-20 ENCOUNTER — Other Ambulatory Visit: Payer: Self-pay | Admitting: Obstetrics and Gynecology

## 2017-02-24 NOTE — Patient Instructions (Signed)
Your procedure is scheduled on:  Tuesday, Mar 11, 2017   Enter through the [employer] [facility] at:  1:45 PM  Pick up the phone at the desk and dial [phone].  Call this number if you have problems the morning of surgery: [phone].  Remember: Do NOT eat food:  After 7:15 AM day of surgery  Do NOT drink clear liquids after:  11:15 AM day of surgery  Take these medicines the morning of surgery with a SIP OF WATER:  None  Stop ALL herbal medications at this time  Do NOT smoke the day of surgery.  Do NOT wear jewelry (body piercing), metal hair clips/bobby pins, make-up, or nail polish. Do NOT wear lotions, powders, or perfumes.  You may wear deodorant. Do NOT shave for 48 hours prior to surgery. Do NOT bring valuables to the hospital. Contacts, dentures, or bridgework may not be worn into surgery.  Leave suitcase in car.  After surgery it may be brought to your room.  For patients admitted to the hospital, checkout time is 11:00 AM the day of discharge.  Bring a copy of your healthcare power of attorney and living will documents.

## 2017-02-25 ENCOUNTER — Other Ambulatory Visit: Payer: Self-pay

## 2017-02-25 ENCOUNTER — Encounter (HOSPITAL_COMMUNITY): Payer: Self-pay

## 2017-02-25 ENCOUNTER — Encounter (HOSPITAL_COMMUNITY)
Admission: RE | Admit: 2017-02-25 | Discharge: 2017-02-25 | Disposition: A | Discharge status: Home / Self Care | Payer: Medicaid Other | Source: Ambulatory Visit | Attending: Obstetrics and Gynecology | Admitting: Obstetrics and Gynecology

## 2017-02-25 DIAGNOSIS — G4730 Sleep apnea, unspecified: Secondary | ICD-10-CM | POA: Insufficient documentation

## 2017-02-25 DIAGNOSIS — Z01810 Encounter for preprocedural cardiovascular examination: Secondary | ICD-10-CM | POA: Diagnosis not present

## 2017-02-25 DIAGNOSIS — Z01812 Encounter for preprocedural laboratory examination: Secondary | ICD-10-CM | POA: Diagnosis present

## 2017-02-25 LAB — CBC
HCT: 45.3 % (ref 36.0–46.0)
Hemoglobin: 15.9 g/dL — ABNORMAL HIGH (ref 12.0–15.0)
MCH: 31.4 pg (ref 26.0–34.0)
MCHC: 35.1 g/dL (ref 30.0–36.0)
MCV: 89.3 fL (ref 78.0–100.0)
Platelets: 201 10*3/uL (ref 150–400)
RBC: 5.07 MIL/uL (ref 3.87–5.11)
RDW: 14 % (ref 11.5–15.5)
WBC: 7.3 10*3/uL (ref 4.0–10.5)

## 2017-02-25 LAB — ABO/RH: ABO/RH(D): A NEG

## 2017-02-25 LAB — TYPE AND SCREEN
ABO/RH(D): A NEG
Antibody Screen: NEGATIVE

## 2017-02-25 LAB — BASIC METABOLIC PANEL
Anion gap: 10 (ref 5–15)
BUN: 10 mg/dL (ref 6–20)
CO2: 24 mmol/L (ref 22–32)
Calcium: 9.1 mg/dL (ref 8.9–10.3)
Chloride: 105 mmol/L (ref 101–111)
Creatinine, Ser: 0.83 mg/dL (ref 0.44–1.00)
GFR calc Af Amer: >60 mL/min (ref >60)
GFR calc non Af Amer: >60 mL/min (ref >60)
Glucose, Bld: 99 mg/dL (ref 65–99)
Potassium: 4 mmol/L (ref 3.5–5.1)
Sodium: 139 mmol/L (ref 135–145)

## 2017-03-11 ENCOUNTER — Ambulatory Visit (HOSPITAL_COMMUNITY): Payer: Medicaid Other | Admitting: Anesthesiology

## 2017-03-11 ENCOUNTER — Encounter: Payer: Self-pay | Admitting: Obstetrics and Gynecology

## 2017-03-11 ENCOUNTER — Observation Stay (HOSPITAL_COMMUNITY)
Admission: RE | Admit: 2017-03-11 | Discharge: 2017-03-12 | Disposition: A | Discharge status: Home / Self Care | Payer: Medicaid Other | Source: Ambulatory Visit | Attending: Obstetrics and Gynecology | Admitting: Obstetrics and Gynecology

## 2017-03-11 ENCOUNTER — Encounter (HOSPITAL_COMMUNITY)
Admission: RE | Disposition: A | Discharge status: Home / Self Care | Payer: Self-pay | Source: Ambulatory Visit | Attending: Obstetrics and Gynecology

## 2017-03-11 DIAGNOSIS — D060 Carcinoma in situ of endocervix: Principal | ICD-10-CM | POA: Insufficient documentation

## 2017-03-11 DIAGNOSIS — F319 Bipolar disorder, unspecified: Secondary | ICD-10-CM | POA: Diagnosis not present

## 2017-03-11 DIAGNOSIS — N800 Endometriosis of uterus: Secondary | ICD-10-CM | POA: Diagnosis not present

## 2017-03-11 DIAGNOSIS — E669 Obesity, unspecified: Secondary | ICD-10-CM | POA: Diagnosis not present

## 2017-03-11 DIAGNOSIS — D069 Carcinoma in situ of cervix, unspecified: Secondary | ICD-10-CM | POA: Diagnosis not present

## 2017-03-11 DIAGNOSIS — Z9889 Other specified postprocedural states: Secondary | ICD-10-CM

## 2017-03-11 HISTORY — PX: VAGINAL HYSTERECTOMY: SHX2639

## 2017-03-11 LAB — PREGNANCY, URINE: Preg Test, Ur: NEGATIVE

## 2017-03-11 LAB — HEMOGLOBIN AND HEMATOCRIT, BLOOD
HCT: 44.9 % (ref 36.0–46.0)
Hemoglobin: 15.1 g/dL — ABNORMAL HIGH (ref 12.0–15.0)

## 2017-03-11 SURGERY — HYSTERECTOMY, VAGINAL
Anesthesia: General | Site: Vagina

## 2017-03-11 MED ORDER — MIDAZOLAM HCL 2 MG/2ML IJ SOLN
INTRAMUSCULAR | Status: DC | PRN
  Administered 2017-03-11: 2 mg via INTRAVENOUS

## 2017-03-11 MED ORDER — ONDANSETRON HCL 4 MG/2ML IJ SOLN: 4.0000 mg | Freq: Four times a day (QID) | INTRAMUSCULAR | Status: DC | PRN

## 2017-03-11 MED ORDER — ROCURONIUM BROMIDE 100 MG/10ML IV SOLN
INTRAVENOUS | Status: DC | PRN
  Administered 2017-03-11 (×4): 10 mg via INTRAVENOUS
  Administered 2017-03-11: 40 mg via INTRAVENOUS

## 2017-03-11 MED ORDER — ONDANSETRON HCL 4 MG/2ML IJ SOLN
INTRAMUSCULAR | Status: DC | PRN
  Administered 2017-03-11: 4 mg via INTRAVENOUS

## 2017-03-11 MED ORDER — KETOROLAC TROMETHAMINE 30 MG/ML IJ SOLN
INTRAMUSCULAR | Status: DC | PRN
  Administered 2017-03-11: 30 mg via INTRAVENOUS

## 2017-03-11 MED ORDER — SUGAMMADEX SODIUM 200 MG/2ML IV SOLN
INTRAVENOUS | Status: AC
  Filled 2017-03-11: qty 2

## 2017-03-11 MED ORDER — DOCUSATE SODIUM 100 MG PO CAPS
100.0000 mg | ORAL_CAPSULE | Freq: Two times a day (BID) | ORAL | Status: DC
  Administered 2017-03-11: 100 mg via ORAL
  Filled 2017-03-11: qty 1

## 2017-03-11 MED ORDER — SCOPOLAMINE 1 MG/3DAYS TD PT72
1.0000 | MEDICATED_PATCH | Freq: Once | TRANSDERMAL | Status: DC
  Administered 2017-03-11: 1.5 mg via TRANSDERMAL

## 2017-03-11 MED ORDER — MIDAZOLAM HCL 2 MG/2ML IJ SOLN
INTRAMUSCULAR | Status: AC
  Filled 2017-03-11: qty 2

## 2017-03-11 MED ORDER — FENTANYL CITRATE (PF) 100 MCG/2ML IJ SOLN
INTRAMUSCULAR | Status: AC
  Filled 2017-03-11: qty 2

## 2017-03-11 MED ORDER — ROCURONIUM BROMIDE 100 MG/10ML IV SOLN
INTRAVENOUS | Status: AC
  Filled 2017-03-11: qty 1

## 2017-03-11 MED ORDER — ONDANSETRON HCL 4 MG/2ML IJ SOLN
INTRAMUSCULAR | Status: AC
  Filled 2017-03-11: qty 2

## 2017-03-11 MED ORDER — PROPOFOL 10 MG/ML IV BOLUS
INTRAVENOUS | Status: DC | PRN
  Administered 2017-03-11: 200 mg via INTRAVENOUS

## 2017-03-11 MED ORDER — ONDANSETRON HCL 4 MG PO TABS: 8.0000 mg | ORAL_TABLET | Freq: Once | ORAL | Status: DC

## 2017-03-11 MED ORDER — FENTANYL CITRATE (PF) 100 MCG/2ML IJ SOLN
INTRAMUSCULAR | Status: AC
  Administered 2017-03-11: 50 ug via INTRAVENOUS
  Filled 2017-03-11: qty 2

## 2017-03-11 MED ORDER — SODIUM CHLORIDE 0.9 % IV SOLN
8.0000 mg | Freq: Once | INTRAVENOUS | Status: DC | PRN
  Filled 2017-03-11: qty 4

## 2017-03-11 MED ORDER — LACTATED RINGERS IV SOLN
INTRAVENOUS | Status: DC
  Administered 2017-03-11: 21:00:00 via INTRAVENOUS

## 2017-03-11 MED ORDER — DEXTROSE 5 % IV SOLN
3.0000 g | INTRAVENOUS | Status: AC
  Administered 2017-03-11: 3 g via INTRAVENOUS
  Filled 2017-03-11: qty 3000

## 2017-03-11 MED ORDER — SUGAMMADEX SODIUM 200 MG/2ML IV SOLN
INTRAVENOUS | Status: AC
Start: 2017-03-11 — End: 2017-03-11
  Filled 2017-03-11: qty 2

## 2017-03-11 MED ORDER — 0.9 % SODIUM CHLORIDE (POUR BTL) OPTIME
TOPICAL | Status: DC | PRN
  Administered 2017-03-11: 1000 mL

## 2017-03-11 MED ORDER — LIDOCAINE HCL (CARDIAC) 20 MG/ML IV SOLN
INTRAVENOUS | Status: AC
  Filled 2017-03-11: qty 5

## 2017-03-11 MED ORDER — LIDOCAINE HCL (CARDIAC) 20 MG/ML IV SOLN
INTRAVENOUS | Status: DC | PRN
  Administered 2017-03-11: 80 mg via INTRAVENOUS

## 2017-03-11 MED ORDER — FENTANYL CITRATE (PF) 100 MCG/2ML IJ SOLN
INTRAMUSCULAR | Status: DC | PRN
  Administered 2017-03-11: 50 ug via INTRAVENOUS
  Administered 2017-03-11 (×2): 25 ug via INTRAVENOUS
  Administered 2017-03-11: 50 ug via INTRAVENOUS
  Administered 2017-03-11: 25 ug via INTRAVENOUS
  Administered 2017-03-11: 100 ug via INTRAVENOUS
  Administered 2017-03-11: 50 ug via INTRAVENOUS
  Administered 2017-03-11: 100 ug via INTRAVENOUS
  Administered 2017-03-11 (×2): 50 ug via INTRAVENOUS
  Administered 2017-03-11: 25 ug via INTRAVENOUS

## 2017-03-11 MED ORDER — OXYCODONE-ACETAMINOPHEN 5-325 MG PO TABS
1.0000 | ORAL_TABLET | ORAL | Status: DC | PRN
  Administered 2017-03-12: 2 via ORAL
  Filled 2017-03-11: qty 2

## 2017-03-11 MED ORDER — KETOROLAC TROMETHAMINE 30 MG/ML IJ SOLN
INTRAMUSCULAR | Status: AC
  Filled 2017-03-11: qty 1

## 2017-03-11 MED ORDER — SOD CITRATE-CITRIC ACID 500-334 MG/5ML PO SOLN
30.0000 mL | ORAL | Status: AC
  Administered 2017-03-11: 30 mL via ORAL

## 2017-03-11 MED ORDER — SUGAMMADEX SODIUM 200 MG/2ML IV SOLN
INTRAVENOUS | Status: DC | PRN
  Administered 2017-03-11: 200 mg via INTRAVENOUS

## 2017-03-11 MED ORDER — IBUPROFEN 800 MG PO TABS
800.0000 mg | ORAL_TABLET | Freq: Three times a day (TID) | ORAL | Status: DC
  Administered 2017-03-11 – 2017-03-12 (×2): 800 mg via ORAL
  Filled 2017-03-11 (×2): qty 1

## 2017-03-11 MED ORDER — SCOPOLAMINE 1 MG/3DAYS TD PT72
MEDICATED_PATCH | TRANSDERMAL | Status: AC
  Administered 2017-03-11: 1.5 mg via TRANSDERMAL
  Filled 2017-03-11: qty 1

## 2017-03-11 MED ORDER — SOD CITRATE-CITRIC ACID 500-334 MG/5ML PO SOLN
ORAL | Status: AC
  Filled 2017-03-11: qty 15

## 2017-03-11 MED ORDER — LACTATED RINGERS IV SOLN
INTRAVENOUS | Status: DC
  Administered 2017-03-11 (×2): via INTRAVENOUS

## 2017-03-11 MED ORDER — ONDANSETRON HCL 4 MG PO TABS: 4.0000 mg | ORAL_TABLET | Freq: Four times a day (QID) | ORAL | Status: DC | PRN

## 2017-03-11 MED ORDER — LACTATED RINGERS IV SOLN
INTRAVENOUS | Status: DC
  Administered 2017-03-11 (×2): via INTRAVENOUS

## 2017-03-11 MED ORDER — PROPOFOL 10 MG/ML IV BOLUS
INTRAVENOUS | Status: AC
  Filled 2017-03-11: qty 20

## 2017-03-11 MED ORDER — FENTANYL CITRATE (PF) 250 MCG/5ML IJ SOLN
INTRAMUSCULAR | Status: AC
  Filled 2017-03-11: qty 5

## 2017-03-11 MED ORDER — SIMETHICONE 80 MG PO CHEW: 80.0000 mg | CHEWABLE_TABLET | Freq: Four times a day (QID) | ORAL | Status: DC | PRN

## 2017-03-11 MED ORDER — ZOLPIDEM TARTRATE 5 MG PO TABS: 5.0000 mg | ORAL_TABLET | Freq: Every evening | ORAL | Status: DC | PRN

## 2017-03-11 MED ORDER — LORAZEPAM 2 MG/ML IJ SOLN
1.0000 mg | Freq: Four times a day (QID) | INTRAMUSCULAR | Status: DC | PRN
  Administered 2017-03-11: 1 mg via INTRAVENOUS
  Filled 2017-03-11: qty 0.5

## 2017-03-11 MED ORDER — HYDROMORPHONE HCL 1 MG/ML IJ SOLN
0.2000 mg | INTRAMUSCULAR | Status: DC | PRN
  Administered 2017-03-11 – 2017-03-12 (×2): 0.6 mg via INTRAVENOUS
  Filled 2017-03-11 (×2): qty 1

## 2017-03-11 MED ORDER — FENTANYL CITRATE (PF) 100 MCG/2ML IJ SOLN
25.0000 ug | INTRAMUSCULAR | Status: DC | PRN
  Administered 2017-03-11 (×4): 50 ug via INTRAVENOUS

## 2017-03-11 MED ORDER — ONDANSETRON HCL 4 MG/2ML IJ SOLN
INTRAMUSCULAR | Status: AC
  Administered 2017-03-11: 8 mg
  Filled 2017-03-11: qty 4

## 2017-03-11 MED ORDER — DEXAMETHASONE SODIUM PHOSPHATE 10 MG/ML IJ SOLN
INTRAMUSCULAR | Status: AC
  Filled 2017-03-11: qty 1

## 2017-03-11 MED ORDER — KETOROLAC TROMETHAMINE 30 MG/ML IJ SOLN
30.0000 mg | Freq: Four times a day (QID) | INTRAMUSCULAR | Status: DC
  Administered 2017-03-12: 30 mg via INTRAVENOUS
  Filled 2017-03-11: qty 1

## 2017-03-11 MED ORDER — DEXAMETHASONE SODIUM PHOSPHATE 10 MG/ML IJ SOLN
INTRAMUSCULAR | Status: DC | PRN
  Administered 2017-03-11: 10 mg via INTRAVENOUS

## 2017-03-11 SURGICAL SUPPLY — 29 items
BLADE SURG 10 STRL SS (BLADE) ×2 IMPLANT
CANISTER SUCT 3000ML PPV (MISCELLANEOUS) ×2 IMPLANT
CLOTH BEACON ORANGE TIMEOUT ST (SAFETY) ×2 IMPLANT
CONT PATH 16OZ SNAP LID 3702 (MISCELLANEOUS) ×2 IMPLANT
COVER LIGHT HANDLE  1/PK (MISCELLANEOUS) ×1
COVER LIGHT HANDLE 1/PK (MISCELLANEOUS) ×1 IMPLANT
DECANTER SPIKE VIAL GLASS SM (MISCELLANEOUS) IMPLANT
ELECT BLADE 6.5 EXT (BLADE) ×2 IMPLANT
GLOVE BIO SURGEON STRL SZ7.5 (GLOVE) ×2 IMPLANT
GLOVE BIO SURGEON STRL SZ8 (GLOVE) ×2 IMPLANT
GLOVE BIOGEL PI IND STRL 6.5 (GLOVE) ×1 IMPLANT
GLOVE BIOGEL PI IND STRL 7.0 (GLOVE) ×2 IMPLANT
GLOVE BIOGEL PI INDICATOR 6.5 (GLOVE) ×1
GLOVE BIOGEL PI INDICATOR 7.0 (GLOVE) ×2
GOWN STRL REUS W/TWL LRG LVL3 (GOWN DISPOSABLE) ×6 IMPLANT
GOWN STRL REUS W/TWL XL LVL3 (GOWN DISPOSABLE) ×2 IMPLANT
NS IRRIG 1000ML POUR BTL (IV SOLUTION) ×2 IMPLANT
PACK TRENDGUARD 600 HYBRD PROC (MISCELLANEOUS) ×1 IMPLANT
PACK VAGINAL WOMENS (CUSTOM PROCEDURE TRAY) ×2 IMPLANT
PAD OB MATERNITY 4.3X12.25 (PERSONAL CARE ITEMS) ×2 IMPLANT
SUT VIC AB 2-0 CT1 18 (SUTURE) IMPLANT
SUT VIC AB 2-0 CT1 27 (SUTURE) ×1
SUT VIC AB 2-0 CT1 TAPERPNT 27 (SUTURE) ×1 IMPLANT
SUT VIC AB PLUS 45CM 1-MO-4 (SUTURE) ×6 IMPLANT
SUT VICRYL 0 ENDOLOOP (SUTURE) IMPLANT
SUT VICRYL 1 TIES 12X18 (SUTURE) ×4 IMPLANT
TOWEL OR 17X24 6PK STRL BLUE (TOWEL DISPOSABLE) ×4 IMPLANT
TRAY FOLEY CATH SILVER 14FR (SET/KITS/TRAYS/PACK) ×2 IMPLANT
TRENDGUARD 600 HYBRID PROC PK (MISCELLANEOUS) ×2

## 2017-03-11 NOTE — Progress Notes (Signed)
Patient ID: Jordan [name], female   DOB: 1966-10-08, 51 y.o.   MRN: [id number] DOS TVH  Pt without complaints except for some cramps. Tolerating diet.  PE AF  VSS Good UOP Lungs clear Heart RRR Abd soft  GU foley, no bleeding  Ext SCD's  H & &  15.9/45.3  ------ 15.1/44.9  A/P Stable Continue with post op care

## 2017-03-11 NOTE — Transfer of Care (Signed)
Immediate Anesthesia Transfer of Care Note  Patient: Jordan [name]  Procedure(s) Performed: Procedure(s): HYSTERECTOMY VAGINAL WITH MORCELLATION (N/A)  Patient Location: PACU  Anesthesia Type:General  Level of Consciousness: sedated  Airway & Oxygen Therapy: Patient Spontanous Breathing and Patient connected to nasal cannula oxygen  Post-op Assessment: Report given to RN  Post vital signs: Reviewed and stable  Last Vitals:  Vitals:   03/11/17 1233  BP: 136/90  Pulse: (!) 120  Resp: 18  Temp: 36.7 C    Last Pain:  Vitals:   03/11/17 1233  TempSrc: Oral      Patients Stated Pain Goal: 3 (97/35/32 9924)  Complications: No apparent anesthesia complications

## 2017-03-11 NOTE — Op Note (Signed)
Jordan [name] PROCEDURE DATE: 03/11/2017  PREOPERATIVE DIAGNOSIS:  Abnormal pap smear, HGSIL/CIS POSTOPERATIVE DIAGNOSIS:  SAA SURGEON:   Chancy Milroy M.D., FACOG ASSISTANT: Aletha Halim, M.D. OPERATION:  Total Vaginal hysterectomy  ANESTHESIA:  General endotracheal.  INDICATIONS: The patient is a 51 y.o. G1P1001 with history of HGSIL/CIS. The patient made a decision to undergo definite surgical treatment. On the preoperative visit, the risks, benefits, indications, and alternatives of the procedure were reviewed with the patient.  On the day of surgery, the risks of surgery were again discussed with the patient including but not limited to: bleeding which may require transfusion or reoperation; infection which may require antibiotics; injury to bowel, bladder, ureters or other surrounding organs; need for additional procedures; thromboembolic phenomenon, incisional problems and other postoperative/anesthesia complications. Written informed consent was obtained.    OPERATIVE FINDINGS: A 8 week size uterus with normal tubes and ovaries bilaterally.  ESTIMATED BLOOD LOSS: 600 ml FLUIDS:  1600 ml of Lactated Ringers URINE OUTPUT:  100 ml of clear yellow urine. SPECIMENS:  Uterus and cervix sent to pathology COMPLICATIONS:  None immediate.  DESCRIPTION OF PROCEDURE:  The patient received intravenous antibiotics and had sequential compression devices applied to her lower extremities while in the preoperative area.  She was then taken to the operating room where general anesthesia was administered and was found to be adequate.  She was placed in the dorsal lithotomy position, and was prepped and draped in a sterile manner.  The patients bladder was drained with a foley and was left indwelling. After an adequate timeout was performed, attention was turned to her pelvis.  A weighted speculum was then placed in the vagina, and the anterior and posterior lips of the cervix were grasped with tenaculum.    The cervix was then circumferentially incised, and the bladder was dissected off the pubocervical fascia without complication.  Th posterior cul-de-sac was entered sharply without difficulty. A suture was placed on the posterior vagina.  A long weighted speculum was inserted into the posterior cul-de-sac.  A Zeplin clamp was then used to clamp the uterosacral ligaments on either side.  They were then cut and sutured ligated with 0 Vicryl, and the ligated uterosacral ligaments were transfixed to the posterior lateral vaginal epithelium to further support the vagina and provide hemostasis.The anterior peritoneum was then enter sharply, enter anterior was verified by presence of bowel. Deaver was replaced anterior.  The cardinal ligaments were then clamped, cut and ligated. The uterine vessels and broad ligaments were then serially clamped with the Zeplin clamps, cut, and suture ligated on both sides.  Excellent hemostasis was noted at this point.  Due to the size of the uterus and to provide better exposure, it was morcellated using a coring technique.  The uterus was then delivered via the posterior cul-de-sac, and the final pedicle involving the uterine ovarian were clamped with the Zeplin clamps, transected, and the uterus was delivered and sent to pathology. These pedicles were secured with #1 Vicryl in a free tie fashion and then with 0 Vicryl in a Bonney fashion.  After completion of the hysterectomy, it was elected not to proceed with the salpingectomy due to poor visualization.  All pedicles from the uterosacral ligament to the cornua were examined hemostasis was confirmed. The peritoneum was the closed in a purse string fashion with a 2/0 Vicryl. The vaginal cuff was closed with 2/0 Vicryl.  The patient tolerated the procedure well.  All instruments, needles, and sponge counts were correct x  2. The patient was taken to the recovery room in stable condition with IV fluids running and clear urine in the Foley  bag.

## 2017-03-11 NOTE — Anesthesia Procedure Notes (Signed)
Procedure Name: Intubation Date/Time: 03/11/2017 1:42 PM Performed by: Casimer Lanius A Pre-anesthesia Checklist: Patient identified, Emergency Drugs available, Suction available and Patient being monitored Patient Re-evaluated:Patient Re-evaluated prior to inductionOxygen Delivery Method: Circle system utilized and Simple face mask Preoxygenation: Pre-oxygenation with 100% oxygen Intubation Type: IV induction and Cricoid Pressure applied Ventilation: Mask ventilation without difficulty Laryngoscope Size: Mac and 3 Grade View: Grade II Tube type: Oral Tube size: 7.0 mm Number of attempts: 1 Airway Equipment and Method: Stylet Placement Confirmation: ETT inserted through vocal cords under direct vision,  positive ETCO2 and breath sounds checked- equal and bilateral Secured at: 21 (right lip) cm Tube secured with: Tape Dental Injury: Teeth and Oropharynx as per pre-operative assessment

## 2017-03-11 NOTE — Anesthesia Preprocedure Evaluation (Signed)
Anesthesia Evaluation  Patient identified by MRN, date of birth, ID band Patient awake    Reviewed: Allergy & Precautions, H&P , Patient's Chart, lab work & pertinent test results, reviewed documented beta blocker date and time   Airway Mallampati: II  TM Distance: >3 FB Neck ROM: full    Dental no notable dental hx.    Pulmonary    Pulmonary exam normal breath sounds clear to auscultation       Cardiovascular  Rhythm:regular Rate:Normal     Neuro/Psych    GI/Hepatic   Endo/Other    Renal/GU      Musculoskeletal   Abdominal   Peds  Hematology   Anesthesia Other Findings Good EF on echo MO  Reproductive/Obstetrics                             Anesthesia Physical Anesthesia Plan  ASA: III  Anesthesia Plan: General   Post-op Pain Management:    Induction: Intravenous  Airway Management Planned: Oral ETT  Additional Equipment:   Intra-op Plan:   Post-operative Plan: Extubation in OR  Informed Consent: I have reviewed the patients History and Physical, chart, labs and discussed the procedure including the risks, benefits and alternatives for the proposed anesthesia with the patient or authorized representative who has indicated his/her understanding and acceptance.   Dental Advisory Given  Plan Discussed with: CRNA and Surgeon  Anesthesia Plan Comments: (  )        Anesthesia Quick Evaluation

## 2017-03-11 NOTE — H&P (Signed)
Jordan [name] is an 51 y.o. female G1P1001 who is being admitted today for TVH/BS secondary to abnormal pap smears.  Pt had pap smear last year returning as HGSIL. Colposcopy was unsatisfactory and she underwent CKC. Results returned as HGSIL/CIS with negative ECC. She at that time elected to follow with pap smears. First pap smear after CKC in Feb returned as HGSIL/CIS. Management options were reviewed with pt and husband. Following discussion pt elected to proceed with define therapy in form of TVH/BS. R/B and post op care were reviewed. Pt has had some DUB in the past w/u included benign EMBX 4/17. She reports no cycle now for over 10 months. She does reports some menopausal Sx, however.     Pertinent Gynecological History: Menses: post-menopausal Bleeding: NA Contraception: none DES exposure: denies Blood transfusions: none Sexually transmitted diseases: no past history Previous GYN Procedures: D & C, CKC  Last mammogram: normal Date: 2018 Last pap: abnormal: HGSIL/CIS Date: 2/18 OB History: G1, P1 TSVD @ 6 #   Menstrual History: Menarche age: 40 No LMP recorded (lmp unknown). Patient is postmenopausal.    Past Medical History:  Diagnosis Date  . Alcohol abuse   . Anemia    patient denies  . Bipolar 1 disorder (HCC)    No medications currently  . CAP (community acquired pneumonia) 03/17/2015  . 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 Mental disorder   . Obesity   . PICC line infection 05/17/2015  . Sepsis due to Gram negative bacteria (MDR E Coli) 02/18/2015  . UTI (lower urinary tract infection)   . Vaginal Pap smear, abnormal     Past Surgical History:  Procedure Laterality Date  . CERVICAL CONIZATION W/BX N/A 07/01/2016   Procedure: CONIZATION CERVIX WITH BIOPSY;  Surgeon: Chancy Milroy, MD;  Location: [facility];  Service: Gynecology;  Laterality: N/A;  . CHOLECYSTECTOMY    . HYSTEROSCOPY W/D&C N/A 01/25/2016   Procedure: DILATATION AND CURETTAGE  /HYSTEROSCOPY;  Surgeon: Mora Bellman, MD;  Location: [facility];  Service: Gynecology;  Laterality: N/A;    Family History  Problem Relation Age of Onset  . Breast cancer Neg Hx     Social History:  reports that she has never smoked. She has never used smokeless tobacco. She reports that she does not drink alcohol or use drugs.  Allergies:  Allergies  Allergen Reactions  . Citalopram Other (See Comments)    Possible cause of pancytopenia. Swelling of tongue, face and throat  . Lamotrigine Other (See Comments)    Possible cause of pancytopenia. Swelling of face, throat and tongue  . Sulfa Antibiotics Anaphylaxis  . Tramadol Anaphylaxis, Shortness Of Breath and Swelling    No prescriptions prior to admission.    Review of Systems  Constitutional: Negative.   Respiratory: Negative.   Cardiovascular: Negative.   Gastrointestinal: Negative.   Genitourinary: Negative.     There were no vitals taken for this visit. Physical Exam  Constitutional: She appears well-developed and well-nourished.  Cardiovascular: Normal rate, regular rhythm and normal heart sounds.   Respiratory: Effort normal and breath sounds normal.  GI: Soft. Bowel sounds are normal.  Genitourinary:  Genitourinary Comments: Deferred to OR    No results found for this or any previous visit (from the past 24 hour(s)).  No results found.  Assessment/Plan: Abnormal pap smears  As noted above pt with H/O abnormal pap smear inspite of CKC. She now desires define therapy. TVH/BS has been reviewed. R/B/post op care  reviewed. Pt has verbalized understanding and agrees to proced.  Chancy Milroy 03/11/2017, 10:15 AM

## 2017-03-12 ENCOUNTER — Encounter (HOSPITAL_COMMUNITY): Payer: Self-pay

## 2017-03-12 DIAGNOSIS — D060 Carcinoma in situ of endocervix: Secondary | ICD-10-CM | POA: Diagnosis not present

## 2017-03-12 LAB — BASIC METABOLIC PANEL
Anion gap: 6 (ref 5–15)
BUN: 11 mg/dL (ref 6–20)
CO2: 27 mmol/L (ref 22–32)
Calcium: 8.5 mg/dL — ABNORMAL LOW (ref 8.9–10.3)
Chloride: 104 mmol/L (ref 101–111)
Creatinine, Ser: 0.77 mg/dL (ref 0.44–1.00)
GFR calc Af Amer: >60 mL/min (ref >60)
GFR calc non Af Amer: >60 mL/min (ref >60)
Glucose, Bld: 124 mg/dL — ABNORMAL HIGH (ref 65–99)
Potassium: 4.1 mmol/L (ref 3.5–5.1)
Sodium: 137 mmol/L (ref 135–145)

## 2017-03-12 LAB — CBC
HCT: 38.4 % (ref 36.0–46.0)
Hemoglobin: 12.9 g/dL (ref 12.0–15.0)
MCH: 30.7 pg (ref 26.0–34.0)
MCHC: 33.6 g/dL (ref 30.0–36.0)
MCV: 91.4 fL (ref 78.0–100.0)
Platelets: 229 10*3/uL (ref 150–400)
RBC: 4.2 MIL/uL (ref 3.87–5.11)
RDW: 14.2 % (ref 11.5–15.5)
WBC: 12.1 10*3/uL — ABNORMAL HIGH (ref 4.0–10.5)

## 2017-03-12 MED ORDER — DOCUSATE SODIUM 100 MG PO CAPS: 100.0000 mg | ORAL_CAPSULE | Freq: Two times a day (BID) | ORAL | 0 refills | Status: DC

## 2017-03-12 MED ORDER — IBUPROFEN 800 MG PO TABS: 800.0000 mg | ORAL_TABLET | Freq: Three times a day (TID) | ORAL | 0 refills | Status: DC

## 2017-03-12 MED ORDER — OXYCODONE-ACETAMINOPHEN 5-325 MG PO TABS: 1.0000 | ORAL_TABLET | ORAL | 0 refills | Status: DC | PRN

## 2017-03-12 NOTE — Discharge Instructions (Signed)
Vaginal Hysterectomy, Care After °Refer to this sheet in the next few weeks. These instructions provide you with information about caring for yourself after your procedure. Your health care provider may also give you more specific instructions. Your treatment has been planned according to current medical practices, but problems sometimes occur. Call your health care provider if you have any problems or questions after your procedure. °What can I expect after the procedure? °After the procedure, it is common to have: °· Pain. °· Soreness and numbness in your incision areas. °· Vaginal bleeding and discharge. °· Constipation. °· Temporary problems emptying the bladder. °· Feelings of sadness or other emotions. °Follow these instructions at home: °Medicines  °· Take over-the-counter and prescription medicines only as told by your health care provider. °· If you were prescribed an antibiotic medicine, take it as told by your health care provider. Do not stop taking the antibiotic even if you start to feel better. °· Do not drive or operate heavy machinery while taking prescription pain medicine. °Activity  °· Return to your normal activities as told by your health care provider. Ask your health care provider what activities are safe for you. °· Get regular exercise as told by your health care provider. You may be told to take short walks every day and go farther each time. °· Do not lift anything that is heavier than 10 lb (4.5 kg). °General instructions  ° °· Do not put anything in your vagina for 6 weeks after your surgery or as told by your health care provider. This includes tampons and douches. °· Do not have sex until your health care provider says you can. °· Do not take baths, swim, or use a hot tub until your health care provider approves. °· Drink enough fluid to keep your urine clear or pale yellow. °· Do not drive for 24 hours if you were given a sedative. °· Keep all follow-up visits as told by your health  care provider. This is important. °Contact a health care provider if: °· Your pain medicine is not helping. °· You have a fever. °· You have redness, swelling, or pain at your incision site. °· You have blood, pus, or a bad-smelling discharge from your vagina. °· You continue to have difficulty urinating. °Get help right away if: °· You have severe abdominal or back pain. °· You have heavy bleeding from your vagina. °· You have chest pain or shortness of breath. °This information is not intended to replace advice given to you by your health care provider. Make sure you discuss any questions you have with your health care provider. °Document Released: 01/29/2016 Document Revised: 03/14/2016 Document Reviewed: 10/22/2015 °Elsevier Interactive Patient Education © 2017 Elsevier Inc. ° °

## 2017-03-12 NOTE — Anesthesia Postprocedure Evaluation (Signed)
Anesthesia Post Note  Patient: Jordan [name]  Procedure(s) Performed: Procedure(s) (LRB): HYSTERECTOMY VAGINAL WITH MORCELLATION (N/A)  Patient location during evaluation: [facility] Anesthesia Type: General Level of consciousness: awake Pain management: satisfactory to patient Vital Signs Assessment: post-procedure vital signs reviewed and stable Respiratory status: spontaneous breathing Cardiovascular status: stable Anesthetic complications: no        Last Vitals:  Vitals:   03/12/17 0000 03/12/17 0324  BP: 137/69 135/78  Pulse: 82 71  Resp:  16  Temp: 36.7 C 36.8 C    Last Pain:  Vitals:   03/12/17 0523  TempSrc:   PainSc: 5    Pain Goal: Patients Stated Pain Goal: 3 (03/12/17 0523)               Casimer Lanius

## 2017-03-12 NOTE — Discharge Summary (Signed)
Physician Discharge Summary  Patient ID: Jordan [name] MRN: [id number] DOB/AGE: [birth year] 51 y.o.  Admit date: 03/11/2017 Discharge date: 03/12/2017  Admission Diagnoses: Abnormal pap smear HGSIL/CIS  Discharge Diagnoses:  Active Problems:   Post-operative state   Discharged Condition: good  Hospital Course: Pt was admitted for [facility] for the above indications. See OP note for additional. Pt's post op course was unremarkable. She progressed to ambulating and voiding without problems. + Flatus. Tolerating diet. Good oral pain control. Pt felt amendable for discharge home.   Consults: None  Significant Diagnostic Studies: labs:   Treatments: surgery: TVH  Discharge Exam: Blood pressure 135/78, pulse 71, temperature 98.3 F (36.8 C), temperature source Oral, resp. rate 16, SpO2 93 %. Lungs clear Heart RRR Abd soft + BS obese GU no bleeding Ext non tender  Disposition: 01-Home or Self Care  Discharge Instructions    Call MD for:  difficulty breathing, headache or visual disturbances    Complete by:  As directed    Call MD for:  extreme fatigue    Complete by:  As directed    Call MD for:  hives    Complete by:  As directed    Call MD for:  persistant dizziness or light-headedness    Complete by:  As directed    Call MD for:  persistant nausea and vomiting    Complete by:  As directed    Call MD for:  redness, tenderness, or signs of infection (pain, swelling, redness, odor or green/yellow discharge around incision site)    Complete by:  As directed    Call MD for:  severe uncontrolled pain    Complete by:  As directed    Call MD for:  temperature >100.4    Complete by:  As directed    Diet general    Complete by:  As directed    Increase activity slowly    Complete by:  As directed      Allergies as of 03/12/2017      Reactions   Citalopram Other (See Comments)   Possible cause of pancytopenia. Swelling of tongue, face and throat   Lamotrigine Other (See Comments)   Possible cause of pancytopenia. Swelling of face, throat and tongue   Sulfa Antibiotics Anaphylaxis   Tramadol Anaphylaxis, Shortness Of Breath, Swelling      Medication List    TAKE these medications   docusate sodium 100 MG capsule Commonly known as:  COLACE Take 1 capsule (100 mg total) by mouth 2 (two) times daily.   ibuprofen 800 MG tablet Commonly known as:  ADVIL,MOTRIN Take 1 tablet (800 mg total) by mouth 3 (three) times daily.   oxyCODONE-acetaminophen 5-325 MG tablet Commonly known as:  PERCOCET/ROXICET Take 1-2 tablets by mouth every 4 (four) hours as needed (moderate to severe pain (when tolerating fluids)).      Follow-up Information    [facility]. Schedule an appointment as soon as possible for a visit in 4 week(s).   Contact information: [address] [place] [phone]          Signed: Chancy Milroy 03/12/2017, 7:36 AM

## 2017-03-12 NOTE — Progress Notes (Signed)
Pt had d/c teaching read to her  And she signed states she could not read it  Partner  With pt    Medicaid  Ride came to get pt  And take home

## 2017-03-13 ENCOUNTER — Telehealth: Payer: Self-pay

## 2017-03-13 ENCOUNTER — Inpatient Hospital Stay (HOSPITAL_COMMUNITY)
Admission: AD | Admit: 2017-03-13 | Discharge: 2017-03-13 | Disposition: A | Discharge status: Home / Self Care | Payer: Medicaid Other | Source: Ambulatory Visit | Attending: Obstetrics and Gynecology | Admitting: Obstetrics and Gynecology

## 2017-03-13 ENCOUNTER — Encounter (HOSPITAL_COMMUNITY): Payer: Self-pay | Admitting: Obstetrics and Gynecology

## 2017-03-13 DIAGNOSIS — M545 Low back pain: Secondary | ICD-10-CM | POA: Insufficient documentation

## 2017-03-13 DIAGNOSIS — F1010 Alcohol abuse, uncomplicated: Secondary | ICD-10-CM | POA: Insufficient documentation

## 2017-03-13 DIAGNOSIS — M79652 Pain in left thigh: Secondary | ICD-10-CM | POA: Insufficient documentation

## 2017-03-13 DIAGNOSIS — D649 Anemia, unspecified: Secondary | ICD-10-CM | POA: Diagnosis not present

## 2017-03-13 DIAGNOSIS — R109 Unspecified abdominal pain: Secondary | ICD-10-CM | POA: Diagnosis present

## 2017-03-13 DIAGNOSIS — Z87440 Personal history of urinary (tract) infections: Secondary | ICD-10-CM | POA: Diagnosis not present

## 2017-03-13 DIAGNOSIS — E669 Obesity, unspecified: Secondary | ICD-10-CM | POA: Diagnosis not present

## 2017-03-13 DIAGNOSIS — M791 Myalgia, unspecified site: Secondary | ICD-10-CM

## 2017-03-13 DIAGNOSIS — F319 Bipolar disorder, unspecified: Secondary | ICD-10-CM | POA: Diagnosis not present

## 2017-03-13 NOTE — Discharge Instructions (Signed)
Muscle Pain, Adult Muscle pain (myalgia) may be mild or severe. In most cases, the pain lasts only a short time and it goes away without treatment. It is normal to feel some muscle pain after starting a workout program. Muscles that have not been used often will be sore at first. Muscle pain may also be caused by many other things, including:  Overuse or muscle strain, especially if you are not in shape. This is the most common cause of muscle pain.  Injury.  Bruises.  Viruses, such as the flu.  Infectious diseases.  A chronic condition that causes muscle tenderness, fatigue, and headache (fibromyalgia).  A condition, such as lupus, in which the bodys disease-fighting system attacks other organs in the body (autoimmune or rheumatologic diseases).  Certain drugs, including ACE inhibitors and statins. To diagnose the cause of your muscle pain, your health care provider will do a physical exam and ask questions about the pain and when it began. If you have not had muscle pain for very long, your health care provider may want to wait before doing much testing. If your muscle pain has lasted a long time, your health care provider may want to run tests right away. In some cases, this may include tests to rule out certain conditions or illnesses. Treatment for muscle pain depends on the cause. Home care is often enough to relieve muscle pain. Your health care provider may also prescribe anti-inflammatory medicine. Follow these instructions at home: Activity   If overuse is causing your muscle pain:  Slow down your activities until the pain goes away.  Do regular, gentle exercises if you are not usually active.  Warm up before exercising. Stretch before and after exercising. This can help lower the risk of muscle pain.  Do not continue working out if the pain is very bad. Bad pain could mean that you have injured a muscle. Managing pain and discomfort    If directed, apply ice to the  sore muscle:  Put ice in a plastic bag.  Place a towel between your skin and the bag.  Leave the ice on for 20 minutes, 2-3 times a day.  You may also alternate between applying ice and applying heat as told by your health care provider. To apply heat, use the heat source that your health care provider recommends, such as a moist heat pack or a heating pad.  Place a towel between your skin and the heat source.  Leave the heat on for 20-30 minutes.  Remove the heat if your skin turns bright red. This is especially important if you are unable to feel pain, heat, or cold. You may have a greater risk of getting burned. Medicines   Take over-the-counter and prescription medicines only as told by your health care provider.  Do not drive or use heavy machinery while taking prescription pain medicine. Contact a health care provider if:  Your muscle pain gets worse and medicines do not help.  You have muscle pain that lasts longer than 3 days.  You have a rash or fever along with muscle pain.  You have muscle pain after a tick bite.  You have muscle pain while working out, even though you are in good physical condition.  You have redness, soreness, or swelling along with muscle pain.  You have muscle pain after starting a new medicine or changing the dose of a medicine. Get help right away if:  You have trouble breathing.  You have trouble swallowing.  You have muscle pain along with a stiff neck, fever, and vomiting.  You have severe muscle weakness or cannot move part of your body. This information is not intended to replace advice given to you by your health care provider. Make sure you discuss any questions you have with your health care provider. Document Released: 08/29/2006 Document Revised: 04/26/2016 Document Reviewed: 02/27/2016 Elsevier Interactive Patient Education  2017 Elsevier Inc.   Musculoskeletal Pain Musculoskeletal pain is muscle and bone aches and pains.  This pain can occur in any part of the body. Follow these instructions at home:  Only take medicines for pain, discomfort, or fever as told by your health care provider.  You may continue all activities unless the activities cause more pain. When the pain lessens, slowly resume normal activities. Gradually increase the intensity and duration of the activities or exercise.  During periods of severe pain, bed rest may be helpful. Lie or sit in any position that is comfortable, but get out of bed and walk around at least every several hours.  If directed, put ice on the injured area.  Put ice in a plastic bag.  Place a towel between your skin and the bag.  Leave the ice on for 20 minutes, 2-3 times a day. Contact a health care provider if:  Your pain is getting worse.  Your pain is not relieved with medicines.  You lose function in the area of the pain if the pain is in your arms, legs, or neck. This information is not intended to replace advice given to you by your health care provider. Make sure you discuss any questions you have with your health care provider. Document Released: 10/07/2005 Document Revised: 03/19/2016 Document Reviewed: 06/11/2013 Elsevier Interactive Patient Education  2017 [employer].

## 2017-03-13 NOTE — MAU Provider Note (Signed)
History     CSN: [id number]  Arrival date and time: 03/13/17 1507   First Provider Initiated Contact with Patient 03/13/17 1530      Chief Complaint  Patient presents with  . Abdominal Pain   HPI   51 yo POD # 2 from a TVH. Discharged yesterday. Pt reports some low back and upper left thigh pain today. She has take Carolynn Serve which has helped some but just got her Percocet filled. She denies any N/V/or vaginal bleeding. Tolerating diet. + Flatus. Ambulating in home without problems. Voiding without problems   Past Medical History:  Diagnosis Date  . Alcohol abuse   . Anemia    patient denies  . Bipolar 1 disorder (HCC)    No medications currently  . CAP (community acquired pneumonia) 03/17/2015  . 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 Mental disorder   . Obesity   . PICC line infection 05/17/2015  . Sepsis due to Gram negative bacteria (MDR E Coli) 02/18/2015  . UTI (lower urinary tract infection)   . Vaginal Pap smear, abnormal     Past Surgical History:  Procedure Laterality Date  . CERVICAL CONIZATION W/BX N/A 07/01/2016   Procedure: CONIZATION CERVIX WITH BIOPSY;  Surgeon: Chancy Milroy, MD;  Location: [facility];  Service: Gynecology;  Laterality: N/A;  . CHOLECYSTECTOMY    . HYSTEROSCOPY W/D&C N/A 01/25/2016   Procedure: DILATATION AND CURETTAGE /HYSTEROSCOPY;  Surgeon: Mora Bellman, MD;  Location: [facility];  Service: Gynecology;  Laterality: N/A;  . VAGINAL HYSTERECTOMY N/A 03/11/2017   Procedure: HYSTERECTOMY VAGINAL WITH MORCELLATION;  Surgeon: Chancy Milroy, MD;  Location: [facility];  Service: Gynecology;  Laterality: N/A;    Family History  Problem Relation Age of Onset  . Breast cancer Neg Hx     Social History  Substance Use Topics  . Smoking status: Never Smoker  . Smokeless tobacco: Never Used  . Alcohol use No    Allergies:  Allergies  Allergen Reactions  . Citalopram Other (See Comments)    Possible cause of pancytopenia.  Swelling of tongue, face and throat  . Lamotrigine Other (See Comments)    Possible cause of pancytopenia. Swelling of face, throat and tongue  . Sulfa Antibiotics Anaphylaxis  . Tramadol Anaphylaxis, Shortness Of Breath and Swelling    Prescriptions Prior to Admission  Medication Sig Dispense Refill Last Dose  . docusate sodium (COLACE) 100 MG capsule Take 1 capsule (100 mg total) by mouth 2 (two) times daily. 10 capsule 0   . ibuprofen (ADVIL,MOTRIN) 800 MG tablet Take 1 tablet (800 mg total) by mouth 3 (three) times daily. 30 tablet 0   . oxyCODONE-acetaminophen (PERCOCET/ROXICET) 5-325 MG tablet Take 1-2 tablets by mouth every 4 (four) hours as needed (moderate to severe pain (when tolerating fluids)). 30 tablet 0     Review of Systems  Respiratory: Negative.   Cardiovascular: Negative.   Gastrointestinal: Negative.   Genitourinary: Negative.    Physical Exam   Blood pressure (!) 148/75, pulse 75, temperature 97.7 F (36.5 C), temperature source Oral, resp. rate 16.  Physical Exam  Constitutional: She appears well-developed and well-nourished.  Cardiovascular: Normal rate, regular rhythm and normal heart sounds.   Respiratory: Effort normal and breath sounds normal.  GI: Soft. Bowel sounds are normal.  Musculoskeletal:  Back: no CVA tenderness, paraspinal muscle tenderness  Legs: trace edema, no Homan's, min tenderness upper aspect of left thigh    MAU Course  Procedures None  Assessment and Plan  POD # 2 TVH Back and leg pain, suspect musculoskeletal pain  Do not think DVT, exam and history not consistent. Pt instructed to alternate between ice/heat, continue with Motrin and use Percocet as needed. Keep Post op in 4 weeks. Call office or return to MAU for any questions or concerns.  Chancy Milroy 03/13/2017, 3:38 PM

## 2017-03-13 NOTE — MAU Note (Signed)
Patient had a hysterectomy on the 22nd. Patient was discharged yesterday. Patient is not taking her narcotic pain meds because she is afraid of the side effects.

## 2017-03-13 NOTE — MAU Note (Signed)
Urine in lab 

## 2017-03-13 NOTE — Telephone Encounter (Signed)
Received message for call a nurse concerning patient calling stating she was in a lot pain since having her surgery on 03/11/2017. She reports having severe pain in her vaginal area and lower leg pain. I have [facility] Patient reports having no transportation to come. I advised her to call 911 and have them pick her up since she is having some much pain.

## 2017-03-29 ENCOUNTER — Encounter (HOSPITAL_COMMUNITY): Payer: Self-pay

## 2017-03-29 ENCOUNTER — Inpatient Hospital Stay (HOSPITAL_COMMUNITY)
Admission: AD | Admit: 2017-03-29 | Discharge: 2017-03-29 | Disposition: A | Discharge status: Home / Self Care | Payer: Medicaid Other | Source: Ambulatory Visit | Attending: Obstetrics & Gynecology | Admitting: Obstetrics & Gynecology

## 2017-03-29 DIAGNOSIS — Z888 Allergy status to other drugs, medicaments and biological substances status: Secondary | ICD-10-CM | POA: Diagnosis not present

## 2017-03-29 DIAGNOSIS — N898 Other specified noninflammatory disorders of vagina: Secondary | ICD-10-CM | POA: Insufficient documentation

## 2017-03-29 DIAGNOSIS — Z9889 Other specified postprocedural states: Secondary | ICD-10-CM

## 2017-03-29 DIAGNOSIS — Z90710 Acquired absence of both cervix and uterus: Secondary | ICD-10-CM | POA: Insufficient documentation

## 2017-03-29 DIAGNOSIS — Z9049 Acquired absence of other specified parts of digestive tract: Secondary | ICD-10-CM | POA: Diagnosis not present

## 2017-03-29 LAB — CBC
HCT: 42.2 % (ref 36.0–46.0)
Hemoglobin: 14.6 g/dL (ref 12.0–15.0)
MCH: 31.7 pg (ref 26.0–34.0)
MCHC: 34.6 g/dL (ref 30.0–36.0)
MCV: 91.5 fL (ref 78.0–100.0)
Platelets: 201 10*3/uL (ref 150–400)
RBC: 4.61 MIL/uL (ref 3.87–5.11)
RDW: 14.4 % (ref 11.5–15.5)
WBC: 8.4 10*3/uL (ref 4.0–10.5)

## 2017-03-29 LAB — URINALYSIS, ROUTINE W REFLEX MICROSCOPIC
Bilirubin Urine: NEGATIVE
Glucose, UA: NEGATIVE mg/dL
Ketones, ur: NEGATIVE mg/dL
Nitrite: POSITIVE — AB
Protein, ur: NEGATIVE mg/dL
Specific Gravity, Urine: 1.02 (ref 1.005–1.030)
pH: 5.5 (ref 5.0–8.0)

## 2017-03-29 LAB — URINALYSIS, MICROSCOPIC (REFLEX)

## 2017-03-29 LAB — WET PREP, GENITAL
Clue Cells Wet Prep HPF POC: NONE SEEN
Sperm: NONE SEEN
Trich, Wet Prep: NONE SEEN
Yeast Wet Prep HPF POC: NONE SEEN

## 2017-03-29 NOTE — MAU Provider Note (Signed)
History     CSN: [id number]  Arrival date and time: 03/29/17 1715   None     Chief Complaint  Patient presents with  . vaginal odor   51 y.o. Female 18 days post [facility] here with vaginal odor x5 days. Reports odor smells like "bloody urine". No VB or discharge. No pain. No fever. She had nothing in the vagina.   Past Medical History:  Diagnosis Date  . Alcohol abuse   . Anemia    patient denies  . Bipolar 1 disorder (HCC)    No medications currently  . CAP (community acquired pneumonia) 03/17/2015  . 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 Mental disorder   . Obesity   . PICC line infection 05/17/2015  . Sepsis due to Gram negative bacteria (MDR E Coli) 02/18/2015  . UTI (lower urinary tract infection)   . Vaginal Pap smear, abnormal     Past Surgical History:  Procedure Laterality Date  . CERVICAL CONIZATION W/BX N/A 07/01/2016   Procedure: CONIZATION CERVIX WITH BIOPSY;  Surgeon: Chancy Milroy, MD;  Location: [facility];  Service: Gynecology;  Laterality: N/A;  . CHOLECYSTECTOMY    . HYSTEROSCOPY W/D&C N/A 01/25/2016   Procedure: DILATATION AND CURETTAGE /HYSTEROSCOPY;  Surgeon: Mora Bellman, MD;  Location: [facility];  Service: Gynecology;  Laterality: N/A;  . VAGINAL HYSTERECTOMY N/A 03/11/2017   Procedure: HYSTERECTOMY VAGINAL WITH MORCELLATION;  Surgeon: Chancy Milroy, MD;  Location: [facility];  Service: Gynecology;  Laterality: N/A;    Family History  Problem Relation Age of Onset  . Breast cancer Neg Hx     Social History  Substance Use Topics  . Smoking status: Never Smoker  . Smokeless tobacco: Never Used  . Alcohol use No    Allergies:  Allergies  Allergen Reactions  . Citalopram Other (See Comments)    Possible cause of pancytopenia. Swelling of tongue, face and throat  . Lamotrigine Other (See Comments)    Possible cause of pancytopenia. Swelling of face, throat and tongue  . Sulfa Antibiotics Anaphylaxis  . Tramadol Anaphylaxis,  Shortness Of Breath and Swelling    Prescriptions Prior to Admission  Medication Sig Dispense Refill Last Dose  . docusate sodium (COLACE) 100 MG capsule Take 1 capsule (100 mg total) by mouth 2 (two) times daily. 10 capsule 0   . ibuprofen (ADVIL,MOTRIN) 800 MG tablet Take 1 tablet (800 mg total) by mouth 3 (three) times daily. 30 tablet 0   . oxyCODONE-acetaminophen (PERCOCET/ROXICET) 5-325 MG tablet Take 1-2 tablets by mouth every 4 (four) hours as needed (moderate to severe pain (when tolerating fluids)). 30 tablet 0     Review of Systems  Constitutional: Negative for chills and fever.  Gastrointestinal: Negative for abdominal pain.  Genitourinary: Negative for difficulty urinating, dysuria, frequency, hematuria, urgency, vaginal bleeding, vaginal discharge and vaginal pain.   Physical Exam   Blood pressure (!) 152/98, pulse 99, temperature 98.1 F (36.7 C), temperature source Oral, resp. rate 18, height 5\' 8"  (1.727 m), weight (!) 166.1 kg (366 lb 4 oz), SpO2 96 %.  Physical Exam  Nursing note and vitals reviewed. Constitutional: She is oriented to person, place, and time. She appears well-developed and well-nourished. No distress.  HENT:  Head: Normocephalic and atraumatic.  Neck: Normal range of motion.  Respiratory: Effort normal.  Genitourinary:  Genitourinary Comments: External: no lesions or erythema Vagina: rugated, pink, moist, vaginal cuff well approximated, suture present, no erythema, scant yellow drainage from left  cuff corner, no malodor    Musculoskeletal: Normal range of motion.  Neurological: She is alert and oriented to person, place, and time.  Skin: Skin is warm and dry.  Psychiatric: She has a normal mood and affect.   Results for orders placed or performed during the hospital encounter of 03/29/17 (from the past 24 hour(s))  Urinalysis, Routine w reflex microscopic     Status: Abnormal   Collection Time: 03/29/17  5:30 PM  Result Value Ref Range    Color, Urine YELLOW YELLOW   APPearance HAZY (A) CLEAR   Specific Gravity, Urine 1.020 1.005 - 1.030   pH 5.5 5.0 - 8.0   Glucose, UA NEGATIVE NEGATIVE mg/dL   Hgb urine dipstick LARGE (A) NEGATIVE   Bilirubin Urine NEGATIVE NEGATIVE   Ketones, ur NEGATIVE NEGATIVE mg/dL   Protein, ur NEGATIVE NEGATIVE mg/dL   Nitrite POSITIVE (A) NEGATIVE   Leukocytes, UA LARGE (A) NEGATIVE  Urinalysis, Microscopic (reflex)     Status: Abnormal   Collection Time: 03/29/17  5:30 PM  Result Value Ref Range   RBC / HPF 0-5 0 - 5 RBC/hpf   WBC, UA 6-30 0 - 5 WBC/hpf   Bacteria, UA MANY (A) NONE SEEN   Squamous Epithelial / LPF 0-5 (A) NONE SEEN  Wet prep, genital     Status: Abnormal   Collection Time: 03/29/17  6:10 PM  Result Value Ref Range   Yeast Wet Prep HPF POC NONE SEEN NONE SEEN   Trich, Wet Prep NONE SEEN NONE SEEN   Clue Cells Wet Prep HPF POC NONE SEEN NONE SEEN   WBC, Wet Prep HPF POC FEW (A) NONE SEEN   Sperm NONE SEEN   CBC     Status: None   Collection Time: 03/29/17  6:28 PM  Result Value Ref Range   WBC 8.4 4.0 - 10.5 K/uL   RBC 4.61 3.87 - 5.11 MIL/uL   Hemoglobin 14.6 12.0 - 15.0 g/dL   HCT 42.2 36.0 - 46.0 %   MCV 91.5 78.0 - 100.0 fL   MCH 31.7 26.0 - 34.0 pg   MCHC 34.6 30.0 - 36.0 g/dL   RDW 14.4 11.5 - 15.5 %   Platelets 201 150 - 400 K/uL   MAU Course  Procedures  MDM Labs ordered and reviewed. No evidence of infection. Odor likely nml d/t healing cuff. UA with many bacteria and WBC, no urinary sx, will send UC. Stable for discharge home.  Assessment and Plan   1. Post-operative state   2. Vaginal odor    Discharge home Follow up with Dr. Rip Harbour Return precautions  Allergies as of 03/29/2017      Reactions   Citalopram Other (See Comments)   Possible cause of pancytopenia. Swelling of tongue, face and throat   Lamotrigine Other (See Comments)   Possible cause of pancytopenia. Swelling of face, throat and tongue   Sulfa Antibiotics Anaphylaxis   Tramadol  Anaphylaxis, Shortness Of Breath, Swelling      Medication List    TAKE these medications   docusate sodium 100 MG capsule Commonly known as:  COLACE Take 1 capsule (100 mg total) by mouth 2 (two) times daily.   ibuprofen 800 MG tablet Commonly known as:  ADVIL,MOTRIN Take 1 tablet (800 mg total) by mouth 3 (three) times daily.   oxyCODONE-acetaminophen 5-325 MG tablet Commonly known as:  PERCOCET/ROXICET Take 1-2 tablets by mouth every 4 (four) hours as needed (moderate to severe pain (when tolerating fluids)).  Julianne Handler, CNM 03/29/2017, 6:14 PM

## 2017-03-29 NOTE — Discharge Instructions (Signed)
Vaginal Hysterectomy, Care After °Refer to this sheet in the next few weeks. These instructions provide you with information about caring for yourself after your procedure. Your health care provider may also give you more specific instructions. Your treatment has been planned according to current medical practices, but problems sometimes occur. Call your health care provider if you have any problems or questions after your procedure. °What can I expect after the procedure? °After the procedure, it is common to have: °· Pain. °· Soreness and numbness in your incision areas. °· Vaginal bleeding and discharge. °· Constipation. °· Temporary problems emptying the bladder. °· Feelings of sadness or other emotions. ° °Follow these instructions at home: °Medicines °· Take over-the-counter and prescription medicines only as told by your health care provider. °· If you were prescribed an antibiotic medicine, take it as told by your health care provider. Do not stop taking the antibiotic even if you start to feel better. °· Do not drive or operate heavy machinery while taking prescription pain medicine. °Activity °· Return to your normal activities as told by your health care provider. Ask your health care provider what activities are safe for you. °· Get regular exercise as told by your health care provider. You may be told to take short walks every day and go farther each time. °· Do not lift anything that is heavier than 10 lb (4.5 kg). °General instructions ° °· Do not put anything in your vagina for 6 weeks after your surgery or as told by your health care provider. This includes tampons and douches. °· Do not have sex until your health care provider says you can. °· Do not take baths, swim, or use a hot tub until your health care provider approves. °· Drink enough fluid to keep your urine clear or pale yellow. °· Do not drive for 24 hours if you were given a sedative. °· Keep all follow-up visits as told by your health  care provider. This is important. °Contact a health care provider if: °· Your pain medicine is not helping. °· You have a fever. °· You have redness, swelling, or pain at your incision site. °· You have blood, pus, or a bad-smelling discharge from your vagina. °· You continue to have difficulty urinating. °Get help right away if: °· You have severe abdominal or back pain. °· You have heavy bleeding from your vagina. °· You have chest pain or shortness of breath. °This information is not intended to replace advice given to you by your health care provider. Make sure you discuss any questions you have with your health care provider. °Document Released: 01/29/2016 Document Revised: 03/14/2016 Document Reviewed: 10/22/2015 °Elsevier Interactive Patient Education © 2018 [employer] ° °

## 2017-03-29 NOTE — MAU Note (Signed)
Pt states she had a hysterectomy last month at [facility] hospital. Pt states since Monday she has had a bloody discharge with a bad odor.

## 2017-04-01 LAB — URINE CULTURE: Culture: >=100000 — AB

## 2017-04-02 ENCOUNTER — Telehealth: Payer: Self-pay | Admitting: Certified Nurse Midwife

## 2017-04-02 NOTE — Telephone Encounter (Signed)
Notified pt of +UTI and need for abx. She reports being seen at her PCP yesterday and they prescribed med for UTI. I verified the abx were indeed for UTI and she confirmed. Instructed pt to finish all abx and f/u with PCP as needed.

## 2017-04-08 ENCOUNTER — Encounter (HOSPITAL_COMMUNITY): Payer: Self-pay | Admitting: Emergency Medicine

## 2017-04-08 ENCOUNTER — Emergency Department (HOSPITAL_COMMUNITY)
Admission: EM | Admit: 2017-04-08 | Discharge: 2017-04-08 | Disposition: A | Discharge status: Home / Self Care | Payer: Medicaid Other | Attending: Emergency Medicine | Admitting: Emergency Medicine

## 2017-04-08 DIAGNOSIS — Z5321 Procedure and treatment not carried out due to patient leaving prior to being seen by health care provider: Secondary | ICD-10-CM | POA: Diagnosis not present

## 2017-04-08 DIAGNOSIS — N390 Urinary tract infection, site not specified: Secondary | ICD-10-CM | POA: Diagnosis not present

## 2017-04-08 LAB — URINALYSIS, ROUTINE W REFLEX MICROSCOPIC
Bilirubin Urine: NEGATIVE
Glucose, UA: NEGATIVE mg/dL
Ketones, ur: NEGATIVE mg/dL
Nitrite: POSITIVE — AB
Protein, ur: NEGATIVE mg/dL
Specific Gravity, Urine: 1.018 (ref 1.005–1.030)
pH: 5 (ref 5.0–8.0)

## 2017-04-08 NOTE — ED Triage Notes (Addendum)
Per [facility] pt from home c/o lumbar back pain x 2 months. Pt has been at primary doctor for same thing and was diagnosed with UTI and given antibiotics about a month ago. Pt states pain is not relieved. Rates pain 10/10. Pt states nothing has changed in the past 2 months, states she just wants answers.

## 2017-04-08 NOTE — ED Notes (Signed)
Pt called  No response from lobby  Registration clerk states pt left

## 2017-04-11 ENCOUNTER — Encounter: Payer: Self-pay | Admitting: Obstetrics and Gynecology

## 2017-04-11 ENCOUNTER — Ambulatory Visit (INDEPENDENT_AMBULATORY_CARE_PROVIDER_SITE_OTHER): Payer: Medicaid Other | Admitting: Obstetrics and Gynecology

## 2017-04-11 VITALS — BP 112/77 | HR 72 | Wt 363.6 lb

## 2017-04-11 DIAGNOSIS — Z9889 Other specified postprocedural states: Secondary | ICD-10-CM

## 2017-04-11 NOTE — Progress Notes (Signed)
Post OP visit  Pt here for post op visit from [facility]. She is doing well, no complaints today Recently treated for UTI by PCP Denies any bowel or bladder dysfunction otherwise  Pathology reviewed  PE AF VSS Lungs clear Heart RRR Abd soft + BS obese GU nl EGBUS, cuff healing well, some vaginal discharge, suture still resolving  A/P Post Op visit  Doing well. Return to nl ADL's. F/U with PCP as need in regards to UTI. F/U with me PRN or in 1 yr for yearly GYN exam

## 2017-04-11 NOTE — Patient Instructions (Signed)
Vaginal Hysterectomy, Care After °Refer to this sheet in the next few weeks. These instructions provide you with information about caring for yourself after your procedure. Your health care provider may also give you more specific instructions. Your treatment has been planned according to current medical practices, but problems sometimes occur. Call your health care provider if you have any problems or questions after your procedure. °What can I expect after the procedure? °After the procedure, it is common to have: °· Pain. °· Soreness and numbness in your incision areas. °· Vaginal bleeding and discharge. °· Constipation. °· Temporary problems emptying the bladder. °· Feelings of sadness or other emotions. ° °Follow these instructions at home: °Medicines °· Take over-the-counter and prescription medicines only as told by your health care provider. °· If you were prescribed an antibiotic medicine, take it as told by your health care provider. Do not stop taking the antibiotic even if you start to feel better. °· Do not drive or operate heavy machinery while taking prescription pain medicine. °Activity °· Return to your normal activities as told by your health care provider. Ask your health care provider what activities are safe for you. °· Get regular exercise as told by your health care provider. You may be told to take short walks every day and go farther each time. °· Do not lift anything that is heavier than 10 lb (4.5 kg). °General instructions ° °· Do not put anything in your vagina for 6 weeks after your surgery or as told by your health care provider. This includes tampons and douches. °· Do not have sex until your health care provider says you can. °· Do not take baths, swim, or use a hot tub until your health care provider approves. °· Drink enough fluid to keep your urine clear or pale yellow. °· Do not drive for 24 hours if you were given a sedative. °· Keep all follow-up visits as told by your health  care provider. This is important. °Contact a health care provider if: °· Your pain medicine is not helping. °· You have a fever. °· You have redness, swelling, or pain at your incision site. °· You have blood, pus, or a bad-smelling discharge from your vagina. °· You continue to have difficulty urinating. °Get help right away if: °· You have severe abdominal or back pain. °· You have heavy bleeding from your vagina. °· You have chest pain or shortness of breath. °This information is not intended to replace advice given to you by your health care provider. Make sure you discuss any questions you have with your health care provider. °Document Released: 01/29/2016 Document Revised: 03/14/2016 Document Reviewed: 10/22/2015 °Elsevier Interactive Patient Education © 2018 Elsevier Inc. ° °

## 2017-04-12 ENCOUNTER — Emergency Department (HOSPITAL_COMMUNITY): Payer: Medicaid Other

## 2017-04-12 ENCOUNTER — Emergency Department (HOSPITAL_COMMUNITY)
Admission: EM | Admit: 2017-04-12 | Discharge: 2017-04-12 | Disposition: A | Discharge status: Home / Self Care | Payer: Medicaid Other | Attending: Emergency Medicine | Admitting: Emergency Medicine

## 2017-04-12 ENCOUNTER — Encounter (HOSPITAL_COMMUNITY): Payer: Self-pay | Admitting: *Deleted

## 2017-04-12 DIAGNOSIS — Z87440 Personal history of urinary (tract) infections: Secondary | ICD-10-CM | POA: Diagnosis not present

## 2017-04-12 DIAGNOSIS — M5441 Lumbago with sciatica, right side: Secondary | ICD-10-CM | POA: Diagnosis not present

## 2017-04-12 DIAGNOSIS — M545 Low back pain: Secondary | ICD-10-CM | POA: Diagnosis present

## 2017-04-12 DIAGNOSIS — N390 Urinary tract infection, site not specified: Secondary | ICD-10-CM | POA: Diagnosis not present

## 2017-04-12 DIAGNOSIS — M5442 Lumbago with sciatica, left side: Secondary | ICD-10-CM | POA: Insufficient documentation

## 2017-04-12 LAB — URINALYSIS, ROUTINE W REFLEX MICROSCOPIC
Bilirubin Urine: NEGATIVE
Glucose, UA: NEGATIVE mg/dL
Ketones, ur: NEGATIVE mg/dL
Nitrite: POSITIVE — AB
Protein, ur: NEGATIVE mg/dL
Specific Gravity, Urine: 1.013 (ref 1.005–1.030)
pH: 6 (ref 5.0–8.0)

## 2017-04-12 LAB — I-STAT CREATININE, ED: Creatinine, Ser: 0.8 mg/dL (ref 0.44–1.00)

## 2017-04-12 MED ORDER — NAPROXEN 375 MG PO TABS: 375.0000 mg | ORAL_TABLET | Freq: Two times a day (BID) | ORAL | 0 refills | Status: DC

## 2017-04-12 MED ORDER — CYCLOBENZAPRINE HCL 10 MG PO TABS: 10.0000 mg | ORAL_TABLET | Freq: Three times a day (TID) | ORAL | 0 refills | Status: DC | PRN

## 2017-04-12 MED ORDER — CEPHALEXIN 500 MG PO CAPS: 500.0000 mg | ORAL_CAPSULE | Freq: Two times a day (BID) | ORAL | 0 refills | Status: DC

## 2017-04-12 NOTE — ED Notes (Signed)
Patient transported to X-ray 

## 2017-04-12 NOTE — ED Triage Notes (Signed)
To ED via [facility] for eval for treatment of back pain x 2 months. No injury noted.

## 2017-04-12 NOTE — Discharge Instructions (Signed)
Your urine does show signs of infection. Although you have no symptoms. I have cultured her urine. It is important for him to follow up with urology for these recurrent urinary tract infections. Take the keflex for UTI.Your x-ray showed chronic changes in your back. Please take the Naproxen as prescribed for pain. Do not take any additional NSAIDs including Motrin, Aleve, Ibuprofen, Advil. Please the the flexeril for muscle relaxation. This medication will make you drowsy so avoid situation that could place you in danger. Make sure you follow-up with the orthopedist concerning her back. Return to the ED if you develop any worsening symptoms including fever, nausea, vomiting, loss of bowel or bladder, unable to urinate, numbness in her groin.

## 2017-04-12 NOTE — ED Provider Notes (Signed)
[place] DEPT Provider Note    By signing my name below, I, Bea Graff, attest that this documentation has been prepared under the direction and in the presence of Ocie Cornfield, PA-C. Electronically Signed: Bea Graff, ED Scribe. 04/12/17. 3:56 PM.    History   Chief Complaint Chief Complaint  Patient presents with  . Back Pain   The history is provided by the patient and medical records. No language interpreter was used.    HPI Comments:  Jordan [name] is a morbidly obese 51 y.o. female, brought in by [facility], who presents to the Emergency Department complaining of low back pain that began approximately two months ago. Pt reports she was treated for a UTI last week but denies ever having any urinary symptoms. States that she has been treated several times for recurrent UTIs without any symptoms. Has seen a urologist "many many many years ago". She has taken Tylenol, Ibuprofen 800 mg and Percocet for pain with no significant relief. Sitting or standing for long periods of time increases the pain. She denies alleviating factors. She denies fever, chills, nausea, vomiting, bruising, wounds, numbness, tingling or weakness of the lower extremities, urinary symptoms, bowel or bladder incontinence, saddle anesthesia. She denies any trauma, injury or fall. She denies h/o back surgeries. Her PCP is at the [facility] clinic. She reports h/o chronic UTI. Patient did have hysterectomy last month after having abnormal Pap smear.   Past Medical History:  Diagnosis Date  . Alcohol abuse   . Anemia    patient denies  . Bipolar 1 disorder (HCC)    No medications currently  . CAP (community acquired pneumonia) 03/17/2015  . 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 Mental disorder   . Obesity   . PICC line infection 05/17/2015  . Sepsis due to Gram negative bacteria (MDR E Coli) 02/18/2015  . UTI (lower urinary tract infection)   . Vaginal Pap smear, abnormal      Patient Active Problem List   Diagnosis Date Noted  . Post-operative state 03/11/2017  . Painful lumpy right breast 07/27/2016  . Diastolic dysfunction 24/26/8341  . Constipation 05/12/2015  . ESBL (extended spectrum beta-lactamase) producing bacteria infection 03/17/2015  . Diarrhea 03/03/2015  . Weakness 03/02/2015  . 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 Generalized anxiety disorder 02/20/2015  . Claustrophobia 02/20/2015  . Transaminitis 02/18/2015  . Chest pain 02/18/2015  . Abdominal pain   . Dizziness   . Hypotension 01/04/2015  . Bipolar disorder ([place]) 06/29/2012    Past Surgical History:  Procedure Laterality Date  . CERVICAL CONIZATION W/BX N/A 07/01/2016   Procedure: CONIZATION CERVIX WITH BIOPSY;  Surgeon: Chancy Milroy, MD;  Location: [facility];  Service: Gynecology;  Laterality: N/A;  . CHOLECYSTECTOMY    . HYSTEROSCOPY W/D&C N/A 01/25/2016   Procedure: DILATATION AND CURETTAGE /HYSTEROSCOPY;  Surgeon: Mora Bellman, MD;  Location: [facility];  Service: Gynecology;  Laterality: N/A;  . VAGINAL HYSTERECTOMY N/A 03/11/2017   Procedure: HYSTERECTOMY VAGINAL WITH MORCELLATION;  Surgeon: Chancy Milroy, MD;  Location: [facility];  Service: Gynecology;  Laterality: N/A;    OB History    Gravida Para Term Preterm AB Living   1 1 1     1    SAB TAB Ectopic Multiple Live Births           1       Home Medications    Prior to Admission medications   Medication Sig Start Date End Date Taking? Authorizing Provider  docusate sodium (COLACE) 100 MG capsule Take 1 capsule (100 mg total) by mouth 2 (two) times daily. Patient not taking: Reported on 04/11/2017 03/12/17   Chancy Milroy, MD  ibuprofen (ADVIL,MOTRIN) 800 MG tablet Take 1 tablet (800 mg total) by mouth 3 (three) times daily. Patient not taking: Reported on 04/11/2017 03/12/17   Chancy Milroy, MD  oxyCODONE-acetaminophen (PERCOCET/ROXICET) 5-325 MG tablet Take 1-2 tablets by mouth every 4 (four) hours as needed  (moderate to severe pain (when tolerating fluids)). Patient not taking: Reported on 04/11/2017 03/12/17   Chancy Milroy, MD    Family History Family History  Problem Relation Age of Onset  . Breast cancer Neg Hx     Social History Social History  Substance Use Topics  . Smoking status: Never Smoker  . Smokeless tobacco: Never Used  . Alcohol use No     Allergies   Citalopram; Lamotrigine; Sulfa antibiotics; and Tramadol   Review of Systems Review of Systems  Constitutional: Negative for chills and fever.  Gastrointestinal:       No bowel or bladder incontinence  Genitourinary: Negative for difficulty urinating, dysuria, frequency, hematuria and urgency.  Musculoskeletal: Positive for back pain.  Skin: Negative for color change and wound.  Neurological: Negative for weakness and numbness.     Physical Exam Updated Vital Signs BP (!) 142/104 (BP Location: Right Arm)   Pulse 87   Temp 98.1 F (36.7 C) (Oral)   Resp 20   LMP  (LMP Unknown) Comment: negative pregnancy test result 09-20-16  SpO2 97%   Physical Exam  Constitutional: She is oriented to person, place, and time. She appears well-developed and well-nourished. No distress.  HENT:  Head: Normocephalic and atraumatic.  Neck: Normal range of motion.  Cardiovascular: Normal rate.   Pulmonary/Chest: Effort normal.  Abdominal:  Morbidly Obese female. No CVA tenderness.  Musculoskeletal: Normal range of motion. She exhibits tenderness.  No midline T spine or L spine tenderness. No deformities or step offs noted. Full ROM. Pelvis is stable.  Patient with bilateral lumbar paraspinal tenderness. Radiates to bilateral buttocks. Positive straight leg raise test bilaterally.   Neurological: She is alert and oriented to person, place, and time.  Strength 5 out of 5 in lower extremities. Patellar reflexes are normal. Sensation intact to sharp/dull. Ambulatory with normal gait  Skin: Skin is warm and dry. Capillary  refill takes less than 2 seconds.  Psychiatric: She has a normal mood and affect. Her behavior is normal.  Nursing note and vitals reviewed.    ED Treatments / Results  DIAGNOSTIC STUDIES: Oxygen Saturation is 97% on RA, normal by my interpretation.   COORDINATION OF CARE: 2:35 PM- Will order labs, urinalysis and imaging. Pt verbalizes understanding and agrees to plan.  Medications - No data to display  Labs (all labs ordered are listed, but only abnormal results are displayed) Labs Reviewed  URINE CULTURE - Abnormal; Notable for the following:       Result Value   Culture MULTIPLE SPECIES PRESENT, SUGGEST RECOLLECTION (*)    All other components within normal limits  URINALYSIS, ROUTINE W REFLEX MICROSCOPIC - Abnormal; Notable for the following:    APPearance CLOUDY (*)    Hgb urine dipstick LARGE (*)    Nitrite POSITIVE (*)    Leukocytes, UA LARGE (*)    Bacteria, UA FEW (*)    Squamous Epithelial / LPF 6-30 (*)    All other components within normal limits  I-STAT CREATININE, ED  EKG  EKG Interpretation None       Radiology Dg Lumbar Spine Complete  Result Date: 04/12/2017 CLINICAL DATA:  Low back pain for 2 months. EXAM: LUMBAR SPINE - COMPLETE 4+ VIEW COMPARISON:  Radiographs of the lumbar spine dated 05/09/2016 FINDINGS: There is slight chronic narrowing of the L5-S1 disc space the remainder of the lumbar spine appears normal. No change since the prior study. IMPRESSION: Chronic degenerative disc disease at L5-S1.  No acute abnormality. Electronically Signed   By: Lorriane Shire M.D.   On: 04/12/2017 15:52    Procedures Procedures (including critical care time)  Medications Ordered in ED Medications - No data to display   Initial Impression / Assessment and Plan / ED Course  I have reviewed the triage vital signs and the nursing notes.  Pertinent labs & imaging results that were available during my care of the patient were reviewed by me and considered  in my medical decision making (see chart for details).     Patient with back pain.Has been treated for recurrent UTIs. Urine does show signs of infection however patient has been on several antibiotics. She has no urinary symptoms. Feel patient is a chronic carrier and has asymptomatic bacteriuria. We'll culture urine. We will refer her to urology for special intervention. Have treated with Keflex. Do not feel patient's symptoms are due to acute pyelonephritis. She is afebrile. No neurological deficits and normal neuro exam. Patient is ambulatory.  No loss of bowel or bladder control. No concern for cauda equina.  No fever, night sweats, weight loss, h/o cancer, IVDA, no recent procedure to back. X-ray shows no acute findings. Patient's symptoms likely due to body habitus first sciatic pain. Will treat with anti-inflammatories and muscle relaxers. Supportive care and return precaution discussed. Appears safe for discharge at this time. Follow up as indicated in discharge paperwork. Dicussed pt with Dr. Oleta Mouse who is agreeable to the above plan.    Final Clinical Impressions(s) / ED Diagnoses   Final diagnoses:  Acute midline low back pain with bilateral sciatica    New Prescriptions Discharge Medication List as of 04/12/2017  4:16 PM    START taking these medications   Details  cephALEXin (KEFLEX) 500 MG capsule Take 1 capsule (500 mg total) by mouth 2 (two) times daily., Starting Sat 04/12/2017, Print    cyclobenzaprine (FLEXERIL) 10 MG tablet Take 1 tablet (10 mg total) by mouth 3 (three) times daily as needed for muscle spasms., Starting Sat 04/12/2017, Print    naproxen (NAPROSYN) 375 MG tablet Take 1 tablet (375 mg total) by mouth 2 (two) times daily., Starting Sat 04/12/2017, Print        I personally performed the services described in this documentation, which was scribed in my presence. The recorded information has been reviewed and is accurate.      Doristine Devoid,  PA-C 04/13/17 1731    Forde Dandy, MD 04/13/17 1901

## 2017-04-13 LAB — URINE CULTURE

## 2017-04-17 ENCOUNTER — Other Ambulatory Visit: Payer: Self-pay | Admitting: Sports Medicine

## 2017-04-17 DIAGNOSIS — M545 Low back pain: Secondary | ICD-10-CM

## 2017-04-20 ENCOUNTER — Emergency Department (HOSPITAL_COMMUNITY)
Admission: EM | Admit: 2017-04-20 | Discharge: 2017-04-20 | Disposition: A | Discharge status: Home / Self Care | Payer: Medicaid Other | Attending: Emergency Medicine | Admitting: Emergency Medicine

## 2017-04-20 ENCOUNTER — Encounter (HOSPITAL_COMMUNITY): Payer: Self-pay

## 2017-04-20 DIAGNOSIS — M545 Low back pain, unspecified: Secondary | ICD-10-CM

## 2017-04-20 DIAGNOSIS — G8929 Other chronic pain: Secondary | ICD-10-CM

## 2017-04-20 MED ORDER — METHOCARBAMOL 500 MG PO TABS
500.0000 mg | ORAL_TABLET | Freq: Once | ORAL | Status: AC
  Administered 2017-04-20: 500 mg via ORAL
  Filled 2017-04-20: qty 1

## 2017-04-20 MED ORDER — IBUPROFEN 800 MG PO TABS
800.0000 mg | ORAL_TABLET | Freq: Once | ORAL | Status: AC
  Administered 2017-04-20: 800 mg via ORAL
  Filled 2017-04-20: qty 1

## 2017-04-20 NOTE — ED Provider Notes (Signed)
[facility] DEPT Provider Note   CSN: [id number] Arrival date & time: 04/20/17  1724     History   Chief Complaint Chief Complaint  Patient presents with  . Back Pain    HPI Jordan [name] is a 51 y.o. female presenting with chronic low back pain without sciatica. She explains that she's been having pain for 1 month she was seen in the emergency department here for the same and is scheduled for an MRI in a week. She had a follow-up with orthopedics. She is currently taking ibuprofen and Flexeril. She is unsure if she is taking Percocet. When asked if she had an allergy she was unclear. Patient reports chronic recurrent UTIs and just finished treatment.She denies any new symptoms today. This is exactly the same pain she's been having this past month. No new injuries or fall.  No unexplained weight loss, malignancy, fever, chills, loss of bowel or bladder function, history of IV drug use or other symptoms.  HPI  Past Medical History:  Diagnosis Date  . Alcohol abuse   . Anemia    patient denies  . Bipolar 1 disorder (HCC)    No medications currently  . CAP (community acquired pneumonia) 03/17/2015  . 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 Mental disorder   . Obesity   . PICC line infection 05/17/2015  . Sepsis due to Gram negative bacteria (MDR E Coli) 02/18/2015  . UTI (lower urinary tract infection)   . Vaginal Pap smear, abnormal     Patient Active Problem List   Diagnosis Date Noted  . Post-operative state 03/11/2017  . Painful lumpy right breast 07/27/2016  . Diastolic dysfunction 16/96/7893  . Constipation 05/12/2015  . ESBL (extended spectrum beta-lactamase) producing bacteria infection 03/17/2015  . Diarrhea 03/03/2015  . Weakness 03/02/2015  . 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 Generalized anxiety disorder 02/20/2015  . Claustrophobia 02/20/2015  . Transaminitis 02/18/2015  . Chest pain 02/18/2015  . Abdominal pain   . Dizziness   .  Hypotension 01/04/2015  . Bipolar disorder ([place]) 06/29/2012    Past Surgical History:  Procedure Laterality Date  . CERVICAL CONIZATION W/BX N/A 07/01/2016   Procedure: CONIZATION CERVIX WITH BIOPSY;  Surgeon: Chancy Milroy, MD;  Location: [facility];  Service: Gynecology;  Laterality: N/A;  . CHOLECYSTECTOMY    . HYSTEROSCOPY W/D&C N/A 01/25/2016   Procedure: DILATATION AND CURETTAGE /HYSTEROSCOPY;  Surgeon: Mora Bellman, MD;  Location: [facility];  Service: Gynecology;  Laterality: N/A;  . VAGINAL HYSTERECTOMY N/A 03/11/2017   Procedure: HYSTERECTOMY VAGINAL WITH MORCELLATION;  Surgeon: Chancy Milroy, MD;  Location: [facility];  Service: Gynecology;  Laterality: N/A;    OB History    Gravida Para Term Preterm AB Living   1 1 1     1    SAB TAB Ectopic Multiple Live Births           1       Home Medications    Prior to Admission medications   Medication Sig Start Date End Date Taking? Authorizing Provider  acetaminophen (TYLENOL) 500 MG tablet Take 1,000 mg by mouth every 6 (six) hours as needed for moderate pain.   Yes [provider]  ibuprofen (ADVIL,MOTRIN) 800 MG tablet Take 1 tablet (800 mg total) by mouth 3 (three) times daily. 03/12/17  Yes Chancy Milroy, MD  oxyCODONE-acetaminophen (PERCOCET/ROXICET) 5-325 MG tablet Take 1-2 tablets by mouth every 4 (four) hours as needed (moderate to severe pain (when tolerating  fluids)). Patient taking differently: Take 1 tablet by mouth every 4 (four) hours as needed (moderate to severe pain (when tolerating fluids)).  03/12/17  Yes Chancy Milroy, MD  cephALEXin (KEFLEX) 500 MG capsule Take 1 capsule (500 mg total) by mouth 2 (two) times daily. Patient not taking: Reported on 04/20/2017 04/12/17   Ocie Cornfield T, PA-C  cyclobenzaprine (FLEXERIL) 10 MG tablet Take 1 tablet (10 mg total) by mouth 3 (three) times daily as needed for muscle spasms. Patient not taking: Reported on 04/20/2017 04/12/17   Ocie Cornfield T, PA-C  docusate  sodium (COLACE) 100 MG capsule Take 1 capsule (100 mg total) by mouth 2 (two) times daily. Patient not taking: Reported on 04/11/2017 03/12/17   Chancy Milroy, MD  naproxen (NAPROSYN) 375 MG tablet Take 1 tablet (375 mg total) by mouth 2 (two) times daily. Patient not taking: Reported on 04/20/2017 04/12/17   Doristine Devoid, PA-C    Family History Family History  Problem Relation Age of Onset  . Breast cancer Neg Hx     Social History Social History  Substance Use Topics  . Smoking status: Never Smoker  . Smokeless tobacco: Never Used  . Alcohol use No     Allergies   Citalopram; Lamotrigine; Sulfa antibiotics; and Tramadol   Review of Systems Review of Systems  Constitutional: Negative for appetite change, chills, diaphoresis, fatigue, fever and unexpected weight change.  HENT: Negative for ear pain and sore throat.   Eyes: Negative for pain and visual disturbance.  Respiratory: Negative for cough, shortness of breath, wheezing and stridor.   Cardiovascular: Negative for chest pain, palpitations and leg swelling.  Gastrointestinal: Negative for abdominal pain, nausea and vomiting.  Genitourinary: Negative for difficulty urinating, dysuria, flank pain, hematuria, pelvic pain, vaginal bleeding and vaginal discharge.       Chronic urinary incontinence. No new loss of bladder function. Patient reports chronic strong odor to her urine and recurrent UTIs. She just finished treatment for UTI denies any dysuria at this time.  Musculoskeletal: Positive for back pain and myalgias. Negative for arthralgias, gait problem and joint swelling.  Skin: Negative for color change, pallor and rash.  Neurological: Negative for seizures, syncope, weakness and numbness.       No saddle paresthesia      Physical Exam Updated Vital Signs BP 108/88   Pulse 95   Temp 98 F (36.7 C) (Oral)   Resp 18   Ht 5\' 8"  (1.727 m)   Wt (!) 164.9 kg (363 lb 8.6 oz)   LMP  (LMP Unknown) Comment:  negative pregnancy test result 09-20-16  SpO2 99%   BMI 55.28 kg/m   Physical Exam  Constitutional: She appears well-developed and well-nourished. No distress.  Afebrile, nontoxic-appearing, lying comfortably in bed in no acute distress.  HENT:  Head: Normocephalic and atraumatic.  Eyes: Conjunctivae and EOM are normal.  Neck: Normal range of motion. Neck supple.  Cardiovascular: Normal rate, regular rhythm, normal heart sounds and intact distal pulses.   No murmur heard. Pulmonary/Chest: Effort normal and breath sounds normal. No respiratory distress. She has no wheezes.  Abdominal: She exhibits no distension.  Musculoskeletal: Normal range of motion. She exhibits tenderness. She exhibits no edema or deformity.  Tenderness palpation of entire lumbar spine and paraspinal muscles.  Patient is able to ambulate with normal stance and gait. Neurovascularly intact distally. 5 out of 5 strength to resisted flexion of the hips, flexion/extension of the knee, plantar flexion/dorsiflexion bilaterally. Strong dorsalis  pedis pulses.  Neurological: She is alert. No sensory deficit.  Skin: Skin is warm and dry. No rash noted. She is not diaphoretic. No erythema. No pallor.  Psychiatric: She has a normal mood and affect.  Nursing note and vitals reviewed.  ED Treatments / Results  Labs (all labs ordered are listed, but only abnormal results are displayed) Labs Reviewed - No data to display  EKG  EKG Interpretation None       Radiology No results found.  Procedures Procedures (including critical care time)  Medications Ordered in ED Medications  methocarbamol (ROBAXIN) tablet 500 mg (500 mg Oral Given 04/20/17 1933)  ibuprofen (ADVIL,MOTRIN) tablet 800 mg (800 mg Oral Given 04/20/17 1933)     Initial Impression / Assessment and Plan / ED Course  I have reviewed the triage vital signs and the nursing notes.  Pertinent labs & imaging results that were available during my care of the  patient were reviewed by me and considered in my medical decision making (see chart for details).    Patient presenting with chronic low back pain without sciatica. She was seen in the emergency Department complaining films revealed chronic degenerative disc disease. She was referred to orthopedics and MRI was ordered and scheduled for this next Sunday.  Patient denies any new symptoms at this time since the same pain she has been having for a month. Patient is well-appearing and ambulating without difficulties. Neurovascularly intact. No red flags  Will treat patient's pain and reassess On reassessment, patient reported significant improvement.  Patient presents with lower back pain.  No gross neurological deficits and normal neuro exam.  Patient has no gait abnormality or concern for cauda equina.  No loss of bowel or bladder control, fever, night sweats, weight loss, h/o malignancy, or IVDU.  RICE protocol and pain medications indicated and discussed with patient.   Discharge home with orthopedic follow-up as scheduled previously. Urge patient to maintain her appointment for MRI on the eighth.  The difference between x-ray imaging an MRI to patient and she understands.  Discussed strict return precautions and advised to return to the emergency department if experiencing any new or worsening symptoms. Instructions were understood and patient agreed with discharge plan.  Final Clinical Impressions(s) / ED Diagnoses   Final diagnoses:  Chronic midline low back pain without sciatica    New Prescriptions Discharge Medication List as of 04/20/2017  8:56 PM       Emeline General, PA-C 04/20/17 2121    Jola Schmidt, MD 04/20/17 2308

## 2017-04-20 NOTE — ED Triage Notes (Signed)
Pt reports middle back pain ongoing for over a month now. She denies any other problems. She denies having any chest pain despite the initial complaint stating "Chest pain" by registration. She denies having or have had any recent chest pain. She states her only complaint right now is the back pain.

## 2017-04-20 NOTE — ED Notes (Signed)
Pt states took 800mg  ibuprofen at 0800 today. Pt does have oxycodone at home but does not take regularly.

## 2017-04-20 NOTE — Discharge Instructions (Signed)
As discussed, continue taking your medication as prescribed. Follow-up with orthopedic regarding your MRI.  Go to your scheduled MRI on Sunday.  Return if you experience worsening symptoms, numbness, weakness or loss of bowel or bladder function or any other new concerning symptoms in the meantime.

## 2017-04-20 NOTE — ED Notes (Signed)
Pt states she is feeling much better and that she would like to go home

## 2017-04-25 ENCOUNTER — Emergency Department (HOSPITAL_COMMUNITY)
Admission: EM | Admit: 2017-04-25 | Discharge: 2017-04-25 | Disposition: A | Discharge status: Home / Self Care | Payer: Medicaid Other | Attending: Emergency Medicine | Admitting: Emergency Medicine

## 2017-04-25 ENCOUNTER — Encounter (HOSPITAL_COMMUNITY): Payer: Self-pay

## 2017-04-25 ENCOUNTER — Emergency Department (HOSPITAL_COMMUNITY): Payer: Medicaid Other

## 2017-04-25 DIAGNOSIS — J188 Other pneumonia, unspecified organism: Secondary | ICD-10-CM | POA: Insufficient documentation

## 2017-04-25 DIAGNOSIS — R05 Cough: Secondary | ICD-10-CM | POA: Diagnosis present

## 2017-04-25 DIAGNOSIS — J189 Pneumonia, unspecified organism: Secondary | ICD-10-CM

## 2017-04-25 MED ORDER — AZITHROMYCIN 250 MG PO TABS
500.0000 mg | ORAL_TABLET | Freq: Once | ORAL | Status: AC
  Administered 2017-04-25: 500 mg via ORAL
  Filled 2017-04-25: qty 2

## 2017-04-25 MED ORDER — CEFTRIAXONE SODIUM 1 G IJ SOLR
1.0000 g | Freq: Once | INTRAMUSCULAR | Status: AC
  Administered 2017-04-25: 1 g via INTRAMUSCULAR
  Filled 2017-04-25: qty 10

## 2017-04-25 MED ORDER — LIDOCAINE HCL (PF) 1 % IJ SOLN
INTRAMUSCULAR | Status: AC
  Administered 2017-04-25: 2 mL via INTRADERMAL
  Filled 2017-04-25: qty 5

## 2017-04-25 MED ORDER — AZITHROMYCIN 250 MG PO TABS: 250.0000 mg | ORAL_TABLET | Freq: Every day | ORAL | 0 refills | Status: DC

## 2017-04-25 MED ORDER — OXYCODONE-ACETAMINOPHEN 5-325 MG PO TABS
2.0000 | ORAL_TABLET | Freq: Once | ORAL | Status: AC
  Administered 2017-04-25: 2 via ORAL
  Filled 2017-04-25: qty 2

## 2017-04-25 MED ORDER — HYDROMORPHONE HCL 1 MG/ML IJ SOLN
1.0000 mg | Freq: Once | INTRAMUSCULAR | Status: AC
  Administered 2017-04-25: 1 mg via INTRAMUSCULAR
  Filled 2017-04-25: qty 1

## 2017-04-25 MED ORDER — LIDOCAINE HCL (PF) 1 % IJ SOLN
2.0000 mL | Freq: Once | INTRAMUSCULAR | Status: AC
  Administered 2017-04-25: 2 mL via INTRADERMAL

## 2017-04-25 NOTE — ED Provider Notes (Signed)
[facility] DEPT Provider Note   CSN: [id number] Arrival date & time: 04/25/17  4665     History   Chief Complaint Chief Complaint  Patient presents with  . Cough    HPI Jordan [name] is a 51 y.o. female.  The history is provided by the patient. No language interpreter was used.  Cough  This is a new problem. The current episode started more than 2 days ago. The problem occurs constantly. The problem has been gradually worsening. The cough is productive of sputum. The maximum temperature recorded prior to her arrival was 100 to 100.9 F. Associated symptoms include shortness of breath. She has tried nothing for the symptoms. The treatment provided moderate relief. She is not a smoker. Her past medical history is significant for bronchitis.  Pt complains of coughing and congestion.  Pt reports she has pain in her back.  Pt has chronic back pain but this area is higher, seems worse with coughing.  Past Medical History:  Diagnosis Date  . Alcohol abuse   . Anemia    patient denies  . Bipolar 1 disorder (HCC)    No medications currently  . CAP (community acquired pneumonia) 03/17/2015  . 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 Mental disorder   . Obesity   . PICC line infection 05/17/2015  . Sepsis due to Gram negative bacteria (MDR E Coli) 02/18/2015  . UTI (lower urinary tract infection)   . Vaginal Pap smear, abnormal     Patient Active Problem List   Diagnosis Date Noted  . Post-operative state 03/11/2017  . Painful lumpy right breast 07/27/2016  . Diastolic dysfunction 99/35/7017  . Constipation 05/12/2015  . ESBL (extended spectrum beta-lactamase) producing bacteria infection 03/17/2015  . Diarrhea 03/03/2015  . Weakness 03/02/2015  . 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 Generalized anxiety disorder 02/20/2015  . Claustrophobia 02/20/2015  . Transaminitis 02/18/2015  . Chest pain 02/18/2015  . Abdominal pain   . Dizziness   . Hypotension  01/04/2015  . Bipolar disorder ([place]) 06/29/2012    Past Surgical History:  Procedure Laterality Date  . CERVICAL CONIZATION W/BX N/A 07/01/2016   Procedure: CONIZATION CERVIX WITH BIOPSY;  Surgeon: Chancy Milroy, MD;  Location: [facility];  Service: Gynecology;  Laterality: N/A;  . CHOLECYSTECTOMY    . HYSTEROSCOPY W/D&C N/A 01/25/2016   Procedure: DILATATION AND CURETTAGE /HYSTEROSCOPY;  Surgeon: Mora Bellman, MD;  Location: [facility];  Service: Gynecology;  Laterality: N/A;  . VAGINAL HYSTERECTOMY N/A 03/11/2017   Procedure: HYSTERECTOMY VAGINAL WITH MORCELLATION;  Surgeon: Chancy Milroy, MD;  Location: [facility];  Service: Gynecology;  Laterality: N/A;    OB History    Gravida Para Term Preterm AB Living   1 1 1     1    SAB TAB Ectopic Multiple Live Births           1       Home Medications    Prior to Admission medications   Medication Sig Start Date End Date Taking? Authorizing Provider  acetaminophen (TYLENOL) 500 MG tablet Take 1,000 mg by mouth every 6 (six) hours as needed for moderate pain.    [provider]  azithromycin (ZITHROMAX) 250 MG tablet Take 1 tablet (250 mg total) by mouth daily. Take first 2 tablets together, then 1 every day until finished. 04/25/17   Fransico Meadow, PA-C  cephALEXin (KEFLEX) 500 MG capsule Take 1 capsule (500 mg total) by mouth 2 (two) times daily. Patient  not taking: Reported on 04/20/2017 04/12/17   Ocie Cornfield T, PA-C  cyclobenzaprine (FLEXERIL) 10 MG tablet Take 1 tablet (10 mg total) by mouth 3 (three) times daily as needed for muscle spasms. Patient not taking: Reported on 04/20/2017 04/12/17   Ocie Cornfield T, PA-C  docusate sodium (COLACE) 100 MG capsule Take 1 capsule (100 mg total) by mouth 2 (two) times daily. Patient not taking: Reported on 04/11/2017 03/12/17   Chancy Milroy, MD  ibuprofen (ADVIL,MOTRIN) 800 MG tablet Take 1 tablet (800 mg total) by mouth 3 (three) times daily. 03/12/17   Chancy Milroy, MD  naproxen  (NAPROSYN) 375 MG tablet Take 1 tablet (375 mg total) by mouth 2 (two) times daily. Patient not taking: Reported on 04/20/2017 04/12/17   Ocie Cornfield T, PA-C  oxyCODONE-acetaminophen (PERCOCET/ROXICET) 5-325 MG tablet Take 1-2 tablets by mouth every 4 (four) hours as needed (moderate to severe pain (when tolerating fluids)). Patient taking differently: Take 1 tablet by mouth every 4 (four) hours as needed (moderate to severe pain (when tolerating fluids)).  03/12/17   Chancy Milroy, MD    Family History Family History  Problem Relation Age of Onset  . Breast cancer Neg Hx     Social History Social History  Substance Use Topics  . Smoking status: Never Smoker  . Smokeless tobacco: Never Used  . Alcohol use No     Allergies   Citalopram; Lamotrigine; Sulfa antibiotics; and Tramadol   Review of Systems Review of Systems  Respiratory: Positive for cough and shortness of breath.   All other systems reviewed and are negative.    Physical Exam Updated Vital Signs BP (!) 123/98   Pulse 81   Temp 97.8 F (36.6 C) (Oral)   Resp (!) 23   Ht 5\' 8"  (1.727 m)   Wt (!) 158.8 kg (350 lb)   LMP  (LMP Unknown) Comment: negative pregnancy test result 09-20-16  SpO2 97%   BMI 53.22 kg/m   Physical Exam  Constitutional: She appears well-developed and well-nourished. No distress.  HENT:  Head: Normocephalic and atraumatic.  Eyes: Conjunctivae are normal.  Neck: Neck supple.  Cardiovascular: Normal rate and regular rhythm.   No murmur heard. Pulmonary/Chest: Effort normal and breath sounds normal. No respiratory distress.  Abdominal: Soft. There is no tenderness.  Musculoskeletal: Normal range of motion. She exhibits no edema.  Neurological: She is alert.  Skin: Skin is warm and dry.  Psychiatric: She has a normal mood and affect.  Nursing note and vitals reviewed.    ED Treatments / Results  Labs (all labs ordered are listed, but only abnormal results are  displayed) Labs Reviewed - No data to display  EKG  EKG Interpretation None       Radiology Dg Chest 2 View  Result Date: 04/25/2017 CLINICAL DATA:  Productive cough, pain in lower right side of chest, hx of CAP. EXAM: CHEST  2 VIEW COMPARISON:  None. FINDINGS: Right basilar atelectasis or infiltrate. Left lung is clear. Heart is normal size. No effusion or acute bony abnormality. IMPRESSION: Right base atelectasis or infiltrate. Electronically Signed   By: Rolm Baptise M.D.   On: 04/25/2017 10:49    Procedures Procedures (including critical care time)  Medications Ordered in ED Medications  oxyCODONE-acetaminophen (PERCOCET/ROXICET) 5-325 MG per tablet 2 tablet (2 tablets Oral Given 04/25/17 1021)  cefTRIAXone (ROCEPHIN) injection 1 g (1 g Intramuscular Given 04/25/17 1208)  azithromycin (ZITHROMAX) tablet 500 mg (500 mg Oral Given 04/25/17  1207)  HYDROmorphone (DILAUDID) injection 1 mg (1 mg Intramuscular Given 04/25/17 1208)  lidocaine (PF) (XYLOCAINE) 1 % injection 2 mL (2 mLs Intradermal Given 04/25/17 1211)     Initial Impression / Assessment and Plan / ED Course  I have reviewed the triage vital signs and the nursing notes.  Pertinent labs & imaging results that were available during my care of the patient were reviewed by me and considered in my medical decision making (see chart for details).     No relief with percocet.  Pt given injection of Rocephin 1 gram IM.  Dilaudid 1 mg IM.  Zithromax 500mg  po.   Pt counseled on pneumonia.    Final Clinical Impressions(s) / ED Diagnoses   Final diagnoses:  Community acquired pneumonia, unspecified laterality    New Prescriptions Discharge Medication List as of 04/25/2017 12:05 PM    START taking these medications   Details  azithromycin (ZITHROMAX) 250 MG tablet Take 1 tablet (250 mg total) by mouth daily. Take first 2 tablets together, then 1 every day until finished., Starting Fri 04/25/2017, Print      An After Visit Summary  was printed and given to the patient.   Fransico Meadow, [place] 04/25/17 1431    Carmin Muskrat, MD 04/25/17 [phone]

## 2017-04-25 NOTE — Discharge Instructions (Signed)
See your Physician for recheck next week  °

## 2017-04-25 NOTE — ED Triage Notes (Signed)
Pt from home with cough and back pain over one week

## 2017-04-27 ENCOUNTER — Ambulatory Visit
Admission: RE | Admit: 2017-04-27 | Discharge: 2017-04-27 | Disposition: A | Discharge status: Home / Self Care | Payer: Medicaid Other | Source: Ambulatory Visit | Attending: Sports Medicine | Admitting: Sports Medicine

## 2017-04-27 DIAGNOSIS — M545 Low back pain: Secondary | ICD-10-CM

## 2017-04-28 ENCOUNTER — Emergency Department (HOSPITAL_COMMUNITY): Payer: Medicaid Other

## 2017-04-28 ENCOUNTER — Emergency Department (HOSPITAL_COMMUNITY)
Admission: EM | Admit: 2017-04-28 | Discharge: 2017-04-28 | Disposition: A | Discharge status: Home / Self Care | Payer: Medicaid Other | Attending: Emergency Medicine | Admitting: Emergency Medicine

## 2017-04-28 ENCOUNTER — Encounter (HOSPITAL_COMMUNITY): Payer: Self-pay

## 2017-04-28 DIAGNOSIS — R079 Chest pain, unspecified: Secondary | ICD-10-CM | POA: Diagnosis present

## 2017-04-28 DIAGNOSIS — Z79899 Other long term (current) drug therapy: Secondary | ICD-10-CM | POA: Diagnosis not present

## 2017-04-28 DIAGNOSIS — J45909 Unspecified asthma, uncomplicated: Secondary | ICD-10-CM | POA: Insufficient documentation

## 2017-04-28 DIAGNOSIS — J40 Bronchitis, not specified as acute or chronic: Secondary | ICD-10-CM

## 2017-04-28 LAB — CBC
HCT: 43.7 % (ref 36.0–46.0)
Hemoglobin: 14.8 g/dL (ref 12.0–15.0)
MCH: 31.3 pg (ref 26.0–34.0)
MCHC: 33.9 g/dL (ref 30.0–36.0)
MCV: 92.4 fL (ref 78.0–100.0)
Platelets: 201 10*3/uL (ref 150–400)
RBC: 4.73 MIL/uL (ref 3.87–5.11)
RDW: 13.8 % (ref 11.5–15.5)
WBC: 6.7 10*3/uL (ref 4.0–10.5)

## 2017-04-28 LAB — D-DIMER, QUANTITATIVE: D-Dimer, Quant: 0.95 ug/mL-FEU — ABNORMAL HIGH (ref 0.00–0.50)

## 2017-04-28 LAB — BASIC METABOLIC PANEL
Anion gap: 9 (ref 5–15)
BUN: 10 mg/dL (ref 6–20)
CO2: 24 mmol/L (ref 22–32)
Calcium: 8.9 mg/dL (ref 8.9–10.3)
Chloride: 106 mmol/L (ref 101–111)
Creatinine, Ser: 0.78 mg/dL (ref 0.44–1.00)
GFR calc Af Amer: >60 mL/min (ref >60)
GFR calc non Af Amer: >60 mL/min (ref >60)
Glucose, Bld: 95 mg/dL (ref 65–99)
Potassium: 3.8 mmol/L (ref 3.5–5.1)
Sodium: 139 mmol/L (ref 135–145)

## 2017-04-28 LAB — I-STAT TROPONIN, ED: Troponin i, poc: 0 ng/mL (ref 0.00–0.08)

## 2017-04-28 MED ORDER — AEROCHAMBER PLUS FLO-VU MEDIUM MISC
1.0000 | Freq: Once | Status: AC
  Administered 2017-04-28: 1
  Filled 2017-04-28: qty 1

## 2017-04-28 MED ORDER — IPRATROPIUM-ALBUTEROL 0.5-2.5 (3) MG/3ML IN SOLN
3.0000 mL | Freq: Once | RESPIRATORY_TRACT | Status: AC
  Administered 2017-04-28: 3 mL via RESPIRATORY_TRACT
  Filled 2017-04-28: qty 3

## 2017-04-28 MED ORDER — KETOROLAC TROMETHAMINE 30 MG/ML IJ SOLN
15.0000 mg | Freq: Once | INTRAMUSCULAR | Status: AC
  Administered 2017-04-28: 15 mg via INTRAVENOUS
  Filled 2017-04-28: qty 1

## 2017-04-28 MED ORDER — SODIUM CHLORIDE 0.9 % IV BOLUS (SEPSIS)
1000.0000 mL | Freq: Once | INTRAVENOUS | Status: AC
  Administered 2017-04-28: 1000 mL via INTRAVENOUS

## 2017-04-28 MED ORDER — METHYLPREDNISOLONE SODIUM SUCC 125 MG IJ SOLR
125.0000 mg | Freq: Once | INTRAMUSCULAR | Status: AC
  Administered 2017-04-28: 125 mg via INTRAVENOUS
  Filled 2017-04-28: qty 2

## 2017-04-28 MED ORDER — IOPAMIDOL (ISOVUE-370) INJECTION 76%
INTRAVENOUS | Status: AC
  Administered 2017-04-28: 100 mL
  Filled 2017-04-28: qty 100

## 2017-04-28 MED ORDER — SODIUM CHLORIDE 0.9 % IV BOLUS (SEPSIS)
1000.0000 mL | Freq: Once | INTRAVENOUS | Status: DC
Start: 2017-04-28 — End: 2017-04-28

## 2017-04-28 MED ORDER — PREDNISONE 10 MG PO TABS: 20.0000 mg | ORAL_TABLET | Freq: Two times a day (BID) | ORAL | 0 refills | Status: AC

## 2017-04-28 MED ORDER — ALBUTEROL SULFATE HFA 108 (90 BASE) MCG/ACT IN AERS
2.0000 | INHALATION_SPRAY | Freq: Once | RESPIRATORY_TRACT | Status: AC
  Administered 2017-04-28: 2 via RESPIRATORY_TRACT
  Filled 2017-04-28: qty 6.7

## 2017-04-28 NOTE — ED Triage Notes (Signed)
Pt brought in by EMS due to having chest pain this morning. Pt c/o of chronic back pain. Per pt, pt was seen here recently for cough and states "i was diagnosed with pneumonia." Pt a&ox4.

## 2017-04-28 NOTE — ED Provider Notes (Signed)
[facility] DEPT Provider Note   CSN: [id number] Arrival date & time: 04/28/17  0903     History   Chief Complaint Chief Complaint  Patient presents with  . Chest Pain    HPI Jordan [name] is a 51 y.o. female.  HPI    Jordan [name] is a 51 y.o. female, with a history of Bipolar, presenting to the ED with productive cough with yellow sputum for the last 2 weeks. Patient was seen in the ED 3 days ago diagnosed with CAP, and prescribed azithromycin. States she has been compliant with this medication with the latest dose this morning. Patient also endorses chest pain intermittently during this time, especially with coughing. She returns today because of continued symptoms. Chest pain is worse upon waking in the morning, rated 7/10, central, "Feels like someone beat the hell out of me" type of soreness, nonradiating, worse with coughing.   Endorses a productive cough with yellow sputum starting two weeks ago. Has chronic SOB, but not worse today. Also talks about back pain that she states is chronic.  Denies N/V/D, syncope, dizziness/lightheadedness, fever/chills, or any other complaints.  Had a partial hysterectomy 2 months ago. Has been compliant with Azithro, has two piills left. Patient states she has an appointment with her PCP tomorrow.   Past Medical History:  Diagnosis Date  . Alcohol abuse   . Anemia    patient denies  . Bipolar 1 disorder (HCC)    No medications currently  . CAP (community acquired pneumonia) 03/17/2015  . 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 Mental disorder   . Obesity   . PICC line infection 05/17/2015  . Sepsis due to Gram negative bacteria (MDR E Coli) 02/18/2015  . UTI (lower urinary tract infection)   . Vaginal Pap smear, abnormal     Patient Active Problem List   Diagnosis Date Noted  . Post-operative state 03/11/2017  . Painful lumpy right breast 07/27/2016  . Diastolic dysfunction 93/73/4287  . Constipation  05/12/2015  . ESBL (extended spectrum beta-lactamase) producing bacteria infection 03/17/2015  . Diarrhea 03/03/2015  . Weakness 03/02/2015  . 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 Generalized anxiety disorder 02/20/2015  . Claustrophobia 02/20/2015  . Transaminitis 02/18/2015  . Chest pain 02/18/2015  . Abdominal pain   . Dizziness   . Hypotension 01/04/2015  . Bipolar disorder ([place]) 06/29/2012    Past Surgical History:  Procedure Laterality Date  . CERVICAL CONIZATION W/BX N/A 07/01/2016   Procedure: CONIZATION CERVIX WITH BIOPSY;  Surgeon: Chancy Milroy, MD;  Location: [facility];  Service: Gynecology;  Laterality: N/A;  . CHOLECYSTECTOMY    . HYSTEROSCOPY W/D&C N/A 01/25/2016   Procedure: DILATATION AND CURETTAGE /HYSTEROSCOPY;  Surgeon: Mora Bellman, MD;  Location: [facility];  Service: Gynecology;  Laterality: N/A;  . VAGINAL HYSTERECTOMY N/A 03/11/2017   Procedure: HYSTERECTOMY VAGINAL WITH MORCELLATION;  Surgeon: Chancy Milroy, MD;  Location: [facility];  Service: Gynecology;  Laterality: N/A;    OB History    Gravida Para Term Preterm AB Living   1 1 1     1    SAB TAB Ectopic Multiple Live Births           1       Home Medications    Prior to Admission medications   Medication Sig Start Date End Date Taking? Authorizing Provider  acetaminophen (TYLENOL) 500 MG tablet Take 1,000 mg by mouth every 6 (six) hours as needed for moderate pain.  Yes [provider]  azithromycin (ZITHROMAX) 250 MG tablet Take 1 tablet (250 mg total) by mouth daily. Take first 2 tablets together, then 1 every day until finished. 04/25/17  Yes Caryl Ada K, PA-C  ibuprofen (ADVIL,MOTRIN) 800 MG tablet Take 1 tablet (800 mg total) by mouth 3 (three) times daily. Patient taking differently: Take 800 mg by mouth every 8 (eight) hours as needed for moderate pain.  03/12/17  Yes Chancy Milroy, MD  naproxen (NAPROSYN) 375 MG tablet Take 1 tablet (375 mg total) by mouth 2 (two) times daily.  04/12/17  Yes Doristine Devoid, PA-C  predniSONE (DELTASONE) 10 MG tablet Take 2 tablets (20 mg total) by mouth 2 (two) times daily with a meal. 04/28/17 05/03/17  Mase Dhondt, Helane Gunther, PA-C    Family History Family History  Problem Relation Age of Onset  . Breast cancer Neg Hx     Social History Social History  Substance Use Topics  . Smoking status: Never Smoker  . Smokeless tobacco: Never Used  . Alcohol use No     Allergies   Citalopram; Lamotrigine; Sulfa antibiotics; and Tramadol   Review of Systems Review of Systems  Constitutional: Negative for chills, diaphoresis and fever.  Respiratory: Positive for cough and shortness of breath (occasional).   Cardiovascular: Positive for chest pain (with coughing). Negative for leg swelling.  Gastrointestinal: Negative for abdominal pain, diarrhea, nausea and vomiting.  Neurological: Negative for dizziness, syncope, weakness, light-headedness and headaches.  All other systems reviewed and are negative.    Physical Exam Updated Vital Signs BP (!) 133/96   Pulse (!) 101   Temp 98.9 F (37.2 C) (Oral)   Resp (!) 22   Ht 5\' 8"  (1.727 m)   Wt (!) 158.8 kg (350 lb)   LMP  (LMP Unknown) Comment: negative pregnancy test result 09-20-16  SpO2 97%   BMI 53.22 kg/m   Physical Exam  Constitutional: She appears well-developed and well-nourished. No distress.  HENT:  Head: Normocephalic and atraumatic.  Eyes: Conjunctivae are normal.  Neck: Neck supple.  Cardiovascular: Regular rhythm, normal heart sounds and intact distal pulses.  Tachycardia present.   Pulmonary/Chest: Effort normal. She has wheezes. She has rhonchi.  No noted increased work of breathing. Faint, lower lobe wheezes and rhonchi bilaterally.  Abdominal: Soft. There is no tenderness. There is no guarding.  Musculoskeletal: She exhibits no edema.  Lymphadenopathy:    She has no cervical adenopathy.  Neurological: She is alert.  Skin: Skin is warm and dry. She is not  diaphoretic.  Psychiatric: She has a normal mood and affect. Her behavior is normal.  Nursing note and vitals reviewed.    ED Treatments / Results  Labs (all labs ordered are listed, but only abnormal results are displayed) Labs Reviewed  D-DIMER, QUANTITATIVE (NOT AT [facility]) - Abnormal; Notable for the following:       Result Value   D-Dimer, Quant 0.95 (*)    All other components within normal limits  BASIC METABOLIC PANEL  CBC  I-STAT TROPOININ, ED    EKG  EKG Interpretation  Date/Time:  Monday April 28 2017 09:11:49 EDT Ventricular Rate:  95 PR Interval:    QRS Duration: 71 QT Interval:  341 QTC Calculation: 429 R Axis:   58 Text Interpretation:  Sinus rhythm Abnormal R-wave progression, early transition Confirmed by Virgel Manifold [phone]) on 04/28/2017 2:45:44 PM       Radiology Dg Chest 2 View  Result Date: 04/28/2017 CLINICAL DATA:  Chest pain, shortness of breath for 2 days EXAM: CHEST  2 VIEW COMPARISON:  04/25/2017 FINDINGS: Mild elevation of the right hemidiaphragm with right base atelectasis or infiltrate, similar to prior study. Left lung is clear. No effusions or acute bony abnormality. IMPRESSION: Right base atelectasis or infiltrate, unchanged. Electronically Signed   By: Rolm Baptise M.D.   On: 04/28/2017 09:51   Ct Angio Chest Pe W And/or Wo Contrast  Result Date: 04/28/2017 CLINICAL DATA:  Cough and back pain. Shortness of breath, tachycardia, and elevated D-dimer. EXAM: CT ANGIOGRAPHY CHEST WITH CONTRAST TECHNIQUE: Multidetector CT imaging of the chest was performed using the standard protocol during bolus administration of intravenous contrast. Multiplanar CT image reconstructions and MIPs were obtained to evaluate the vascular anatomy. CONTRAST:  100 cc Isovue 370 COMPARISON:  02/10/2016 FINDINGS: Cardiovascular: The heart size is normal. No pericardial effusion. Coronary artery calcification is noted. Atherosclerotic calcification is noted in the wall of the  thoracic aorta. No thoracic aortic aneurysm. Bolus timing suboptimal and assessment a lower lungs is hindered by breathing motion during image acquisition. Within these limitations, there is no large central pulmonary embolus in the main pulmonary outflow tract or either main pulmonary artery. No lobar pulmonary embolic disease. Segmental and subsegmental pulmonary arteries are less well evaluated, specially to the lower lobes and distal embolism in these branches could be obscured. Mediastinum/Nodes: No mediastinal lymphadenopathy. There is no hilar lymphadenopathy. The esophagus has normal imaging features. There is no axillary lymphadenopathy. Lungs/Pleura: Diffuse bronchial wall thickening is noted in both lungs. Areas of small airway impaction are seen in the lower lobes bilaterally. 6 mm posterior right lower lobe pulmonary nodule is stable. No focal airspace consolidation. Scattered areas of mosaic attenuation within lung parenchyma may be related to small airways disease. No pulmonary edema or pleural effusion. Upper Abdomen: Calcified granulomata noted in the liver Musculoskeletal: Bone windows reveal no worrisome lytic or sclerotic osseous lesions. Review of the MIP images confirms the above findings. IMPRESSION: 1. No large central pulmonary embolus. Assessment of segmental and subsegmental pulmonary arteries to the lower lobes may not be reliable due to bolus timing and motion artifact. 2. Diffuse bronchial wall thickening with scattered areas of small airway impaction in the lower lobes bilaterally, potentially from mucus impaction. Given bilateral involvement, aspiration considered less likely. 3. Scattered areas mosaic attenuation in the lungs bilaterally. This appearance can be seen in cases of small airways disease. 4. Stable 6 mm right lower lobe pulmonary nodule since the exam from 1 year ago. CT at 12 months (from today's scan) is considered optional for low-risk patients, but is recommended for  high-risk patients. This recommendation follows the consensus statement: Guidelines for Management of Incidental Pulmonary Nodules Detected on CT Images: From the Fleischner Society 2017; Radiology 2017; 284:228-243. Aortic Atherosclerois (ICD10-170.0) Electronically Signed   By: Misty Stanley M.D.   On: 04/28/2017 15:15   Mr Lumbar Spine Wo Contrast  Result Date: 04/27/2017 CLINICAL DATA:  Unspecified low back pain, chronic and worsening. No radicular symptoms. EXAM: MRI LUMBAR SPINE WITHOUT CONTRAST TECHNIQUE: Multiplanar, multisequence MR imaging of the lumbar spine was performed. No intravenous contrast was administered. COMPARISON:  Radiography 04/12/2017 FINDINGS: Segmentation:  Standard. Alignment:  Physiologic. Vertebrae: No fracture, evidence of discitis, or aggressive bone lesion. Incidental L1 hemangioma Conus medullaris: Extends to the L1-2 disc level and appears normal. Paraspinal and other soft tissues: Incidental renal cysts. Disc levels: L3-4: Minimal foraminal disc bulging.  No impingement L4-5: Shallow central disc protrusion without  impingement. Negative facets. L5-S1: Moderate degenerative disc narrowing with bulging and a small down turning protrusion minimally deforming the ventral thecal sac. No noted impingement. Negative facets. IMPRESSION: 1. No stenosis noted throughout the lumbar spine. 2. L5-S1 moderate disc degeneration. 3. L4-5 tiny central disc protrusion. Electronically Signed   By: Monte Fantasia M.D.   On: 04/27/2017 10:10    Procedures Procedures (including critical care time)  Medications Ordered in ED Medications  albuterol (PROVENTIL HFA;VENTOLIN HFA) 108 (90 Base) MCG/ACT inhaler 2 puff (not administered)  AEROCHAMBER PLUS FLO-VU MEDIUM MISC 1 each (not administered)  ketorolac (TORADOL) 30 MG/ML injection 15 mg (15 mg Intravenous Given 04/28/17 1208)  ipratropium-albuterol (DUONEB) 0.5-2.5 (3) MG/3ML nebulizer solution 3 mL (3 mLs Nebulization Given 04/28/17 1206)    sodium chloride 0.9 % bolus 1,000 mL (0 mLs Intravenous Stopped 04/28/17 1625)  iopamidol (ISOVUE-370) 76 % injection (100 mLs  Contrast Given 04/28/17 1347)  methylPREDNISolone sodium succinate (SOLU-MEDROL) 125 mg/2 mL injection 125 mg (125 mg Intravenous Given 04/28/17 1535)     Initial Impression / Assessment and Plan / ED Course  I have reviewed the triage vital signs and the nursing notes.  Pertinent labs & imaging results that were available during my care of the patient were reviewed by me and considered in my medical decision making (see chart for details).  Clinical Course as of Apr 28 1646  Mon Apr 28, 2017  1300 Patient voices improvement in her cough and breathing. Denies chest pain. Pulse about 106. Lung sounds are more clear with wheezing localized in lower right. SPO2 97% on RA.  [SJ]    Clinical Course User Index [SJ] Breshae Belcher C, PA-C     Patient presents with continued cough. Patient is nontoxic appearing with no increased work of breathing. Low suspicion for ACS. HEART score is 2, indicating low risk for a cardiac event. PERC positive. Wells criteria score is 3, indicating moderate risk for PE.  Positive d-dimer. CT shows no PE, but does show bronchitis changes. Patient voiced significant improvement with treatment here in the ED. She was able to ambulate without significant change in SPO2. I observed the patient during ambulation. The noted drop in SPO2 was fleeting and was not seen again. I believe it was an erroneous value.  PCP follow-up. Given an albuterol inhaler with spacer prior to discharge. The patient was given instructions for home care as well as return precautions. Patient voices understanding of these instructions, accepts the plan, and is comfortable with discharge.  Findings and plan of care discussed with Virgel Manifold, MD.   Vitals:   04/28/17 0915 04/28/17 0916 04/28/17 1000 04/28/17 1009  BP: (!) 133/96  110/77   Pulse: (!) 101   (!) 102  Resp:  (!) 22   (!) 24  Temp:  98.9 F (37.2 C)    TempSrc:  Oral    SpO2: 97%   98%  Weight:  (!) 158.8 kg (350 lb)    Height:  5\' 8"  (1.727 m)     Vitals:   04/28/17 1000 04/28/17 1009 04/28/17 1156 04/28/17 1206  BP: 110/77  (!) 129/91   Pulse:  (!) 102 93   Resp:  (!) 24 18   Temp:      TempSrc:      SpO2:  98% 99% 97%  Weight:      Height:         Final Clinical Impressions(s) / ED Diagnoses   Final diagnoses:  Bronchitis  New Prescriptions New Prescriptions   PREDNISONE (DELTASONE) 10 MG TABLET    Take 2 tablets (20 mg total) by mouth 2 (two) times daily with a meal.     Lorayne Bender, PA-C 04/28/17 1648    Lorayne Bender, PA-C 04/28/17 1650    Virgel Manifold, MD 04/29/17 0930

## 2017-04-28 NOTE — ED Notes (Signed)
Per CT; pt rt AC IV "blew." CT not obtained at this time. Will attempt PIV for CT scan.

## 2017-04-28 NOTE — ED Notes (Signed)
Ambulated pt in the hallway while checking pulse ox. Pt O2 started at 99%. While ambulating dropped to 83% and then picked back up to 99% before getting back to her room. PA was notified.

## 2017-04-28 NOTE — Discharge Instructions (Signed)
There were no signs of pneumonia on CT scan today. There were signs of possible bronchitis. Continue taking the azithromycin until gone. Take the prednisone, as prescribed, until gone. Use the albuterol inhaler as needed for minor shortness breath. Ibuprofen, naproxen, or Tylenol for pain. Follow up with your primary care provider as planned tomorrow. Return to the ED for worsening symptoms.

## 2017-05-02 NOTE — Addendum Note (Signed)
Addendum  created 05/02/17 1306 by Lyndle Herrlich, MD   Sign clinical note

## 2017-05-02 NOTE — Anesthesia Postprocedure Evaluation (Signed)
Anesthesia Post Note  Patient: Jordan [name]  Procedure(s) Performed: Procedure(s) (LRB): HYSTERECTOMY VAGINAL WITH MORCELLATION (N/A)     Anesthesia Post Evaluation  Last Vitals:  Vitals:   03/12/17 0324 03/12/17 0739  BP: 135/78 (!) 106/57  Pulse: 71 76  Resp: 16 16  Temp: 36.8 C 36.6 C    Last Pain:  Vitals:   03/12/17 0824  TempSrc:   PainSc: 3                  Ketsia Linebaugh EDWARD

## 2017-05-05 ENCOUNTER — Emergency Department (HOSPITAL_COMMUNITY)
Admission: EM | Admit: 2017-05-05 | Discharge: 2017-05-05 | Disposition: A | Discharge status: Home / Self Care | Payer: Medicaid Other | Attending: Emergency Medicine | Admitting: Emergency Medicine

## 2017-05-05 ENCOUNTER — Emergency Department (HOSPITAL_COMMUNITY): Payer: Medicaid Other

## 2017-05-05 DIAGNOSIS — I5030 Unspecified diastolic (congestive) heart failure: Secondary | ICD-10-CM | POA: Insufficient documentation

## 2017-05-05 DIAGNOSIS — J3089 Other allergic rhinitis: Secondary | ICD-10-CM | POA: Insufficient documentation

## 2017-05-05 DIAGNOSIS — Z79899 Other long term (current) drug therapy: Secondary | ICD-10-CM | POA: Diagnosis not present

## 2017-05-05 DIAGNOSIS — R05 Cough: Secondary | ICD-10-CM | POA: Diagnosis present

## 2017-05-05 DIAGNOSIS — J00 Acute nasopharyngitis [common cold]: Secondary | ICD-10-CM | POA: Insufficient documentation

## 2017-05-05 MED ORDER — BENZONATATE 100 MG PO CAPS: 100.0000 mg | ORAL_CAPSULE | Freq: Three times a day (TID) | ORAL | 0 refills | Status: DC

## 2017-05-05 MED ORDER — CETIRIZINE HCL 10 MG PO TABS: 10.0000 mg | ORAL_TABLET | Freq: Every day | ORAL | 1 refills | Status: DC

## 2017-05-05 MED ORDER — FLUTICASONE PROPIONATE 50 MCG/ACT NA SUSP: 2.0000 | Freq: Every day | NASAL | 0 refills | Status: DC

## 2017-05-05 MED ORDER — ALBUTEROL SULFATE HFA 108 (90 BASE) MCG/ACT IN AERS
2.0000 | INHALATION_SPRAY | Freq: Once | RESPIRATORY_TRACT | Status: AC
  Administered 2017-05-05: 2 via RESPIRATORY_TRACT
  Filled 2017-05-05: qty 6.7

## 2017-05-05 NOTE — ED Notes (Signed)
Pt to xray

## 2017-05-05 NOTE — ED Notes (Signed)
Per EMS-states she could have gone to PCP but did not want to wait till 11 am

## 2017-05-05 NOTE — ED Triage Notes (Signed)
Pt states that she has had bronchitis (dx two weeks ago) and is having continuing cough and rib pain from coughing. Alert and oriented.

## 2017-05-05 NOTE — ED Provider Notes (Signed)
[facility] DEPT Provider Note   CSN: [id number] Arrival date & time: 05/05/17  1037  By signing my name below, I, Jordan [name], attest that this documentation has been prepared under the direction and in the presence of non-physician practitioner, Jordan Pean, PA-C. Electronically Signed: Theresia [name], ED Scribe. 05/05/17. 1:09 PM.  History   Chief Complaint Chief Complaint  Patient presents with  . Bronchitis   The history is provided by the patient. No language interpreter was used.   HPI Comments: Jordan [name] is a 51 y.o. female who presents to the Emergency Department complaining of a persistent, moderate productive cough onset 2 weeks ago. Pt has been seen in the ED twice in the last 2 weeks and treated for Bronchitis and PNA. Pt has finished her medications and still is not feeling better. Pt reports associated postnasal drip, wheezing, sneezing  and ear pressure. No hx of tobacco use. No hx of asthma or COPD. Pt denies fever or chest pain, or any other complaints at this time.  Past Medical History:  Diagnosis Date  . Alcohol abuse   . Anemia    patient denies  . Bipolar 1 disorder (HCC)    No medications currently  . CAP (community acquired pneumonia) 03/17/2015  . 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 Mental disorder   . Obesity   . PICC line infection 05/17/2015  . Sepsis due to Gram negative bacteria (MDR E Coli) 02/18/2015  . UTI (lower urinary tract infection)   . Vaginal Pap smear, abnormal     Patient Active Problem List   Diagnosis Date Noted  . Post-operative state 03/11/2017  . Painful lumpy right breast 07/27/2016  . Diastolic dysfunction 67/34/1937  . Constipation 05/12/2015  . ESBL (extended spectrum beta-lactamase) producing bacteria infection 03/17/2015  . Diarrhea 03/03/2015  . Weakness 03/02/2015  . 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 Generalized anxiety disorder 02/20/2015  . Claustrophobia 02/20/2015  .  Transaminitis 02/18/2015  . Chest pain 02/18/2015  . Abdominal pain   . Dizziness   . Hypotension 01/04/2015  . Bipolar disorder ([place]) 06/29/2012    Past Surgical History:  Procedure Laterality Date  . CERVICAL CONIZATION W/BX N/A 07/01/2016   Procedure: CONIZATION CERVIX WITH BIOPSY;  Surgeon: Chancy Milroy, MD;  Location: [facility];  Service: Gynecology;  Laterality: N/A;  . CHOLECYSTECTOMY    . HYSTEROSCOPY W/D&C N/A 01/25/2016   Procedure: DILATATION AND CURETTAGE /HYSTEROSCOPY;  Surgeon: Mora Bellman, MD;  Location: [facility];  Service: Gynecology;  Laterality: N/A;  . VAGINAL HYSTERECTOMY N/A 03/11/2017   Procedure: HYSTERECTOMY VAGINAL WITH MORCELLATION;  Surgeon: Chancy Milroy, MD;  Location: [facility];  Service: Gynecology;  Laterality: N/A;    OB History    Gravida Para Term Preterm AB Living   1 1 1     1    SAB TAB Ectopic Multiple Live Births           1       Home Medications    Prior to Admission medications   Medication Sig Start Date End Date Taking? Authorizing Provider  acetaminophen (TYLENOL) 500 MG tablet Take 1,000 mg by mouth every 6 (six) hours as needed for moderate pain.    [provider]  azithromycin (ZITHROMAX) 250 MG tablet Take 1 tablet (250 mg total) by mouth daily. Take first 2 tablets together, then 1 every day until finished. 04/25/17   Fransico Meadow, PA-C  benzonatate (TESSALON) 100 MG capsule Take 1 capsule (  100 mg total) by mouth every 8 (eight) hours. 05/05/17   Jordan Pean, PA-C  cetirizine (ZYRTEC ALLERGY) 10 MG tablet Take 1 tablet (10 mg total) by mouth daily. 05/05/17   Jordan Pean, PA-C  fluticasone (FLONASE) 50 MCG/ACT nasal spray Place 2 sprays into both nostrils daily. 05/05/17   Jordan Pean, PA-C  ibuprofen (ADVIL,MOTRIN) 800 MG tablet Take 1 tablet (800 mg total) by mouth 3 (three) times daily. Patient taking differently: Take 800 mg by mouth every 8 (eight) hours as needed for moderate pain.  03/12/17   Chancy Milroy, MD  naproxen (NAPROSYN) 375 MG tablet Take 1 tablet (375 mg total) by mouth 2 (two) times daily. 04/12/17   Doristine Devoid, PA-C    Family History Family History  Problem Relation Age of Onset  . Breast cancer Neg Hx     Social History Social History  Substance Use Topics  . Smoking status: Never Smoker  . Smokeless tobacco: Never Used  . Alcohol use No     Allergies   Citalopram; Lamotrigine; Sulfa antibiotics; and Tramadol   Review of Systems Review of Systems  Constitutional: Negative for fever.  HENT: Positive for congestion, ear pain, postnasal drip, rhinorrhea and sneezing. Negative for trouble swallowing.   Eyes: Negative for visual disturbance.  Respiratory: Positive for cough, shortness of breath and wheezing.   Cardiovascular: Negative for chest pain.  Skin: Negative for rash.  Neurological: Negative for light-headedness.     Physical Exam Updated Vital Signs BP (!) 126/99 (BP Location: Right Arm)   Pulse (!) 103   Temp 98 F (36.7 C) (Oral)   Resp 18   LMP  (LMP Unknown) Comment: negative pregnancy test result 09-20-16  SpO2 94%   Physical Exam  Constitutional: She appears well-developed and well-nourished. No distress.  Nontoxic appearing. Obese female.  HENT:  Head: Normocephalic and atraumatic.  Right Ear: External ear normal.  Left Ear: External ear normal.  Mouth/Throat: Oropharynx is clear and moist. No posterior oropharyngeal erythema.  Clear middle ear effusion without erythematous TMs. Rhinorrhea and boggy nasal turbinates bilaterally.  Throat is clear.  Eyes: Pupils are equal, round, and reactive to light. Conjunctivae are normal. Right eye exhibits no discharge. Left eye exhibits no discharge.  Neck: Normal range of motion. Neck supple. No JVD present.  Cardiovascular: Normal rate, regular rhythm, normal heart sounds and intact distal pulses.   HR 88.   Pulmonary/Chest: Effort normal and breath sounds normal. No stridor. No  respiratory distress. She has no wheezes. She has no rales.  Lungs are clear to ascultation bilaterally. Symmetric chest expansion bilaterally. No increased work of breathing. No rales or rhonchi.  No wheezing noted.   Abdominal: Soft. There is no tenderness.  Lymphadenopathy:    She has no cervical adenopathy.  Neurological: She is alert. Coordination normal.  Skin: Skin is warm and dry. Capillary refill takes less than 2 seconds. No rash noted. She is not diaphoretic. No erythema. No pallor.  Psychiatric: She has a normal mood and affect. Her behavior is normal.  Nursing note and vitals reviewed.    ED Treatments / Results  DIAGNOSTIC STUDIES: Oxygen Saturation is 94% on RA, adequate by my interpretation.   COORDINATION OF CARE: 1:05 PM-Discussed next steps with pt including treatment with a nasal spray, cough medicine, and albuterol inhaler. Pt verbalized understanding and is agreeable with the plan.   Labs (all labs ordered are listed, but only abnormal results are displayed) Labs Reviewed - No  data to display  EKG  EKG Interpretation None       Radiology Dg Chest 2 View  Result Date: 05/05/2017 CLINICAL DATA:  Cough, bronchitis EXAM: CHEST  2 VIEW COMPARISON:  CTA chest dated 04/28/2017 FINDINGS: Lungs are essentially clear.  No focal consolidation No pleural effusion or pneumothorax. The heart is normal in size. Visualized osseous structures are within normal limits. IMPRESSION: No evidence of acute cardiopulmonary disease. Electronically Signed   By: Julian Hy M.D.   On: 05/05/2017 12:06    Procedures Procedures (including critical care time)  Medications Ordered in ED Medications  albuterol (PROVENTIL HFA;VENTOLIN HFA) 108 (90 Base) MCG/ACT inhaler 2 puff (not administered)     Initial Impression / Assessment and Plan / ED Course  I have reviewed the triage vital signs and the nursing notes.  Pertinent labs & imaging results that were available during  my care of the patient were reviewed by me and considered in my medical decision making (see chart for details).     This is a 51 y.o. female who presents to the Emergency Department complaining of a persistent, moderate productive cough onset 2 weeks ago. Pt has been seen in the ED twice in the last 2 weeks and treated for Bronchitis and PNA. Pt has finished her medications and still is not feeling better. Pt reports associated postnasal drip, wheezing, sneezing  and ear pressure. No hx of tobacco use. No hx of asthma or COPD. She denies any fevers or chest pain. On exam patient is afebrile nontoxic appearing. She is evidence of an upper respiratory infection of boggy nasal turbinates, rhinorrhea and clear middle ear effusion bilaterally. Lungs are clear to auscultation bilaterally. I hear no wheezing on my exam. Patient is reports she's had some wheezing. She does request albuterol inhaler. Will provide her one but she notes having some wheezing. Chest x-ray is clear. No evidence of pneumonia. I see the patient had CT angiography of her chest to rule out a PE 7 days ago. It was negative for PE. Patient appears to have an upper respiratory infection that has been ongoing and causing her cough. She is lots of postnasal drip and rhinorrhea. I believe this is why she is having worsening cough while lying down at night. We'll start her on medications for an upper respiratory infection including Flonase, Zyrtec and Tessalon Perles. I discussed return precautions. I advised the patient to follow-up with their primary care provider this week. I advised the patient to return to the emergency department with new or worsening symptoms or new concerns. The patient verbalized understanding and agreement with plan.       Final Clinical Impressions(s) / ED Diagnoses   Final diagnoses:  Acute nasopharyngitis  Seasonal allergic rhinitis due to other allergic trigger    New Prescriptions New Prescriptions    BENZONATATE (TESSALON) 100 MG CAPSULE    Take 1 capsule (100 mg total) by mouth every 8 (eight) hours.   CETIRIZINE (ZYRTEC ALLERGY) 10 MG TABLET    Take 1 tablet (10 mg total) by mouth daily.   FLUTICASONE (FLONASE) 50 MCG/ACT NASAL SPRAY    Place 2 sprays into both nostrils daily.   I personally performed the services described in this documentation, which was scribed in my presence. The recorded information has been reviewed and is accurate.      Jordan Pean, PA-C 05/05/17 1314    Dorie Rank, MD 05/05/17 [phone]

## 2017-05-26 ENCOUNTER — Inpatient Hospital Stay (HOSPITAL_COMMUNITY)
Admission: AD | Admit: 2017-05-26 | Discharge: 2017-05-26 | Disposition: A | Discharge status: Home / Self Care | Payer: Medicaid Other | Source: Ambulatory Visit | Attending: Obstetrics & Gynecology | Admitting: Obstetrics & Gynecology

## 2017-05-26 ENCOUNTER — Encounter (HOSPITAL_COMMUNITY): Payer: Self-pay | Admitting: *Deleted

## 2017-05-26 DIAGNOSIS — N3001 Acute cystitis with hematuria: Secondary | ICD-10-CM | POA: Insufficient documentation

## 2017-05-26 DIAGNOSIS — N898 Other specified noninflammatory disorders of vagina: Secondary | ICD-10-CM | POA: Diagnosis present

## 2017-05-26 LAB — URINALYSIS, ROUTINE W REFLEX MICROSCOPIC
Bilirubin Urine: NEGATIVE
Glucose, UA: NEGATIVE mg/dL
Ketones, ur: NEGATIVE mg/dL
Nitrite: POSITIVE — AB
Protein, ur: NEGATIVE mg/dL
Specific Gravity, Urine: 1.018 (ref 1.005–1.030)
pH: 5 (ref 5.0–8.0)

## 2017-05-26 LAB — WET PREP, GENITAL
Clue Cells Wet Prep HPF POC: NONE SEEN
Sperm: NONE SEEN
Trich, Wet Prep: NONE SEEN
Yeast Wet Prep HPF POC: NONE SEEN

## 2017-05-26 MED ORDER — NITROFURANTOIN MONOHYD MACRO 100 MG PO CAPS: 100.0000 mg | ORAL_CAPSULE | Freq: Two times a day (BID) | ORAL | 0 refills | Status: AC

## 2017-05-26 NOTE — MAU Note (Signed)
Pt C/O strong vaginal odor "for a good while."  Dx'd with UTI last month, urine was sent for a culture, didn't show anything, did not receive antibiotics.  Denies dysuria or vaginal discharge.

## 2017-05-27 LAB — HIV ANTIBODY (ROUTINE TESTING W REFLEX): HIV Screen 4th Generation wRfx: NONREACTIVE

## 2017-05-27 LAB — GC/CHLAMYDIA PROBE AMP (~~LOC~~) NOT AT ARMC
Chlamydia: NEGATIVE
Neisseria Gonorrhea: NEGATIVE

## 2017-05-29 LAB — URINE CULTURE
Culture: >=100000 — AB
Special Requests: NORMAL

## 2017-05-30 ENCOUNTER — Telehealth: Payer: Self-pay | Admitting: Advanced Practice Midwife

## 2017-05-30 NOTE — Telephone Encounter (Signed)
Opened in error

## 2017-05-30 NOTE — MAU Provider Note (Signed)
History     CSN: [id number]  Arrival date and time: 05/26/17 1831       Chief Complaint  Patient presents with  . vaginal odor   HPI  [name] is a 51 yo non-pregnant female presenting to MAU with complaints of persistent "vaginal odor for 12 years".  She reports she has had multiple pelvic exams, wet preps and other tests/treatments with no change.  She says it "smells really bad and I can't take it.  I want to know what is going on and how I can fix it?"  She reports being seen at multiple places many times for this same problem.  She most recently was seen by her PCP and was told she had a UTI, but would not be given abx Rx until UCx was resulted.  She was then called back by the same office and told that she did not have a UTI.  She is frustrated by this and wants to know if she has a UTI or not and why she keeps having this vaginal odor.   Past Medical History:  Diagnosis Date  . Alcohol abuse   . Anemia    patient denies  . Bipolar 1 disorder (HCC)    No medications currently  . CAP (community acquired pneumonia) 03/17/2015  . 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 Mental disorder   . Obesity   . PICC line infection 05/17/2015  . Sepsis due to Gram negative bacteria (MDR E Coli) 02/18/2015  . UTI (lower urinary tract infection)   . Vaginal Pap smear, abnormal     Past Surgical History:  Procedure Laterality Date  . CERVICAL CONIZATION W/BX N/A 07/01/2016   Procedure: CONIZATION CERVIX WITH BIOPSY;  Surgeon: Chancy Milroy, MD;  Location: [facility];  Service: Gynecology;  Laterality: N/A;  . CHOLECYSTECTOMY    . HYSTEROSCOPY W/D&C N/A 01/25/2016   Procedure: DILATATION AND CURETTAGE /HYSTEROSCOPY;  Surgeon: Mora Bellman, MD;  Location: [facility];  Service: Gynecology;  Laterality: N/A;  . VAGINAL HYSTERECTOMY N/A 03/11/2017   Procedure: HYSTERECTOMY VAGINAL WITH MORCELLATION;  Surgeon: Chancy Milroy, MD;  Location: [facility];  Service: Gynecology;   Laterality: N/A;    Family History  Problem Relation Age of Onset  . Alcohol abuse Father   . Cancer Father   . Breast cancer Neg Hx     Social History  Substance Use Topics  . Smoking status: Never Smoker  . Smokeless tobacco: Never Used  . Alcohol use No    Allergies:  Allergies  Allergen Reactions  . Citalopram Other (See Comments)    Possible cause of pancytopenia. Swelling of tongue, face and throat  . Lamotrigine Other (See Comments)    Possible cause of pancytopenia. Swelling of face, throat and tongue  . Sulfa Antibiotics Anaphylaxis  . Tramadol Anaphylaxis, Shortness Of Breath and Swelling    No prescriptions prior to admission.    Review of Systems  Constitutional: Negative.   HENT: Negative.   Eyes: Negative.   Respiratory: Negative.   Cardiovascular: Negative.   Gastrointestinal: Negative.   Endocrine: Negative.   Genitourinary: Negative for vaginal bleeding.       "strong vaginal odor"  Musculoskeletal: Negative.   Skin: Negative.   Allergic/Immunologic: Negative.   Neurological: Negative.   Hematological: Negative.   Psychiatric/Behavioral: Negative.    Physical Exam   Blood pressure 120/84, pulse (!) 125, temperature 98.3 F (36.8 C), temperature source Oral, resp. rate 16, height  5' 8"  (1.727 m), weight (!) 166.5 kg (367 lb).  Physical Exam  Constitutional: She is oriented to person, place, and time. She appears well-developed and well-nourished.  HENT:  Head: Normocephalic.  Eyes: Pupils are equal, round, and reactive to light.  Neck: Normal range of motion.  Cardiovascular: Normal rate, regular rhythm and normal heart sounds.   Respiratory: Effort normal and breath sounds normal.  GI: Soft. Bowel sounds are normal. There is no tenderness.  Genitourinary:  Genitourinary Comments: Uterus: non-tender, cx; smooth, pink, no lesions, scant amt of thick, white d/c, closed/long/firm, no CMT or friability, no adnexal tenderness; no abnormal  vaginal odor detected, but strong urine smell detected throughout exam  Musculoskeletal: Normal range of motion.  Neurological: She is alert and oriented to person, place, and time. She has normal reflexes.  Psychiatric: Her speech is normal. Judgment and thought content normal. Her affect is blunt. She is agitated and aggressive (can be overheard several times yelling at someone on the phone; using very loud tone with provider during assessment, and exam). Cognition and memory are normal.    MAU Course  Procedures  MDM CCUA Wet Prep GC/CT HIV UCx  Recent Results (from the past 2160 hour(s))  Pregnancy, urine     Status: None   Collection Time: 03/11/17 12:15 PM  Result Value Ref Range   Preg Test, Ur NEGATIVE NEGATIVE    Comment:        THE SENSITIVITY OF THIS METHODOLOGY IS >20 mIU/mL.   Hemoglobin and hematocrit, blood     Status: Abnormal   Collection Time: 03/11/17  6:04 PM  Result Value Ref Range   Hemoglobin 15.1 (H) 12.0 - 15.0 g/dL   HCT 44.9 36.0 - 46.0 %  CBC     Status: Abnormal   Collection Time: 03/12/17  5:39 AM  Result Value Ref Range   WBC 12.1 (H) 4.0 - 10.5 K/uL   RBC 4.20 3.87 - 5.11 MIL/uL   Hemoglobin 12.9 12.0 - 15.0 g/dL   HCT 38.4 36.0 - 46.0 %   MCV 91.4 78.0 - 100.0 fL   MCH 30.7 26.0 - 34.0 pg   MCHC 33.6 30.0 - 36.0 g/dL   RDW 14.2 11.5 - 15.5 %   Platelets 229 150 - 400 K/uL  Basic metabolic panel     Status: Abnormal   Collection Time: 03/12/17  5:39 AM  Result Value Ref Range   Sodium 137 135 - 145 mmol/L   Potassium 4.1 3.5 - 5.1 mmol/L   Chloride 104 101 - 111 mmol/L   CO2 27 22 - 32 mmol/L   Glucose, Bld 124 (H) 65 - 99 mg/dL   BUN 11 6 - 20 mg/dL   Creatinine, Ser 0.77 0.44 - 1.00 mg/dL   Calcium 8.5 (L) 8.9 - 10.3 mg/dL   GFR calc non Af Amer >60 >60 mL/min   GFR calc Af Amer >60 >60 mL/min    Comment: (NOTE) The eGFR has been calculated using the CKD EPI equation. This calculation has not been validated in all clinical  situations. eGFR's persistently <60 mL/min signify possible Chronic Kidney Disease.    Anion gap 6 5 - 15  Urinalysis, Routine w reflex microscopic     Status: Abnormal   Collection Time: 03/29/17  5:30 PM  Result Value Ref Range   Color, Urine YELLOW YELLOW   APPearance HAZY (A) CLEAR   Specific Gravity, Urine 1.020 1.005 - 1.030   pH 5.5 5.0 - 8.0  Glucose, UA NEGATIVE NEGATIVE mg/dL   Hgb urine dipstick LARGE (A) NEGATIVE   Bilirubin Urine NEGATIVE NEGATIVE   Ketones, ur NEGATIVE NEGATIVE mg/dL   Protein, ur NEGATIVE NEGATIVE mg/dL   Nitrite POSITIVE (A) NEGATIVE   Leukocytes, UA LARGE (A) NEGATIVE  Urinalysis, Microscopic (reflex)     Status: Abnormal   Collection Time: 03/29/17  5:30 PM  Result Value Ref Range   RBC / HPF 0-5 0 - 5 RBC/hpf   WBC, UA 6-30 0 - 5 WBC/hpf   Bacteria, UA MANY (A) NONE SEEN   Squamous Epithelial / LPF 0-5 (A) NONE SEEN  Urine Culture     Status: Abnormal   Collection Time: 03/29/17  5:30 PM  Result Value Ref Range   Specimen Description URINE, CLEAN CATCH    Special Requests NONE    Culture >=100,000 COLONIES/mL ESCHERICHIA COLI (A)    Report Status 04/01/2017 FINAL    Organism ID, Bacteria ESCHERICHIA COLI (A)       Susceptibility   Escherichia coli - MIC*    AMPICILLIN 4 SENSITIVE Sensitive     CEFAZOLIN <=4 SENSITIVE Sensitive     CEFTRIAXONE <=1 SENSITIVE Sensitive     CIPROFLOXACIN >=4 RESISTANT Resistant     GENTAMICIN <=1 SENSITIVE Sensitive     IMIPENEM <=0.25 SENSITIVE Sensitive     NITROFURANTOIN 64 INTERMEDIATE Intermediate     TRIMETH/SULFA >=320 RESISTANT Resistant     AMPICILLIN/SULBACTAM <=2 SENSITIVE Sensitive     PIP/TAZO <=4 SENSITIVE Sensitive     Extended ESBL NEGATIVE Sensitive     * >=100,000 COLONIES/mL ESCHERICHIA COLI  Wet prep, genital     Status: Abnormal   Collection Time: 03/29/17  6:10 PM  Result Value Ref Range   Yeast Wet Prep HPF POC NONE SEEN NONE SEEN   Trich, Wet Prep NONE SEEN NONE SEEN   Clue  Cells Wet Prep HPF POC NONE SEEN NONE SEEN   WBC, Wet Prep HPF POC FEW (A) NONE SEEN    Comment: FEW BACTERIA SEEN   Sperm NONE SEEN   CBC     Status: None   Collection Time: 03/29/17  6:28 PM  Result Value Ref Range   WBC 8.4 4.0 - 10.5 K/uL   RBC 4.61 3.87 - 5.11 MIL/uL   Hemoglobin 14.6 12.0 - 15.0 g/dL   HCT 42.2 36.0 - 46.0 %   MCV 91.5 78.0 - 100.0 fL   MCH 31.7 26.0 - 34.0 pg   MCHC 34.6 30.0 - 36.0 g/dL   RDW 14.4 11.5 - 15.5 %   Platelets 201 150 - 400 K/uL  Urinalysis, Routine w reflex microscopic- may I&O cath if menses     Status: Abnormal   Collection Time: 04/08/17  6:44 PM  Result Value Ref Range   Color, Urine YELLOW YELLOW   APPearance CLOUDY (A) CLEAR   Specific Gravity, Urine 1.018 1.005 - 1.030   pH 5.0 5.0 - 8.0   Glucose, UA NEGATIVE NEGATIVE mg/dL   Hgb urine dipstick MODERATE (A) NEGATIVE   Bilirubin Urine NEGATIVE NEGATIVE   Ketones, ur NEGATIVE NEGATIVE mg/dL   Protein, ur NEGATIVE NEGATIVE mg/dL   Nitrite POSITIVE (A) NEGATIVE   Leukocytes, UA LARGE (A) NEGATIVE   RBC / HPF 6-30 0 - 5 RBC/hpf   WBC, UA TOO NUMEROUS TO COUNT 0 - 5 WBC/hpf   Bacteria, UA MANY (A) NONE SEEN   Squamous Epithelial / LPF 6-30 (A) NONE SEEN   Mucous PRESENT  Urinalysis, Routine w reflex microscopic     Status: Abnormal   Collection Time: 04/12/17  2:29 PM  Result Value Ref Range   Color, Urine YELLOW YELLOW   APPearance CLOUDY (A) CLEAR   Specific Gravity, Urine 1.013 1.005 - 1.030   pH 6.0 5.0 - 8.0   Glucose, UA NEGATIVE NEGATIVE mg/dL   Hgb urine dipstick LARGE (A) NEGATIVE   Bilirubin Urine NEGATIVE NEGATIVE   Ketones, ur NEGATIVE NEGATIVE mg/dL   Protein, ur NEGATIVE NEGATIVE mg/dL   Nitrite POSITIVE (A) NEGATIVE   Leukocytes, UA LARGE (A) NEGATIVE   RBC / HPF 6-30 0 - 5 RBC/hpf   WBC, UA TOO NUMEROUS TO COUNT 0 - 5 WBC/hpf   Bacteria, UA FEW (A) NONE SEEN   Squamous Epithelial / LPF 6-30 (A) NONE SEEN   WBC Clumps PRESENT    Mucous PRESENT   Urine  culture     Status: Abnormal   Collection Time: 04/12/17  2:30 PM  Result Value Ref Range   Specimen Description URINE, RANDOM    Special Requests NONE    Culture MULTIPLE SPECIES PRESENT, SUGGEST RECOLLECTION (A)    Report Status 04/13/2017 FINAL   I-stat Creatinine, ED     Status: None   Collection Time: 04/12/17  3:05 PM  Result Value Ref Range   Creatinine, Ser 0.80 0.44 - 1.00 mg/dL  Basic metabolic panel     Status: None   Collection Time: 04/28/17  9:22 AM  Result Value Ref Range   Sodium 139 135 - 145 mmol/L   Potassium 3.8 3.5 - 5.1 mmol/L   Chloride 106 101 - 111 mmol/L   CO2 24 22 - 32 mmol/L   Glucose, Bld 95 65 - 99 mg/dL   BUN 10 6 - 20 mg/dL   Creatinine, Ser 0.78 0.44 - 1.00 mg/dL   Calcium 8.9 8.9 - 10.3 mg/dL   GFR calc non Af Amer >60 >60 mL/min   GFR calc Af Amer >60 >60 mL/min    Comment: (NOTE) The eGFR has been calculated using the CKD EPI equation. This calculation has not been validated in all clinical situations. eGFR's persistently <60 mL/min signify possible Chronic Kidney Disease.    Anion gap 9 5 - 15  CBC     Status: None   Collection Time: 04/28/17  9:22 AM  Result Value Ref Range   WBC 6.7 4.0 - 10.5 K/uL   RBC 4.73 3.87 - 5.11 MIL/uL   Hemoglobin 14.8 12.0 - 15.0 g/dL   HCT 43.7 36.0 - 46.0 %   MCV 92.4 78.0 - 100.0 fL   MCH 31.3 26.0 - 34.0 pg   MCHC 33.9 30.0 - 36.0 g/dL   RDW 13.8 11.5 - 15.5 %   Platelets 201 150 - 400 K/uL  D-dimer, quantitative     Status: Abnormal   Collection Time: 04/28/17  9:22 AM  Result Value Ref Range   D-Dimer, Quant 0.95 (H) 0.00 - 0.50 ug/mL-FEU    Comment: (NOTE) At the manufacturer cut-off of 0.50 ug/mL FEU, this assay has been documented to exclude PE with a sensitivity and negative predictive value of 97 to 99%.  At this time, this assay has not been approved by the FDA to exclude DVT/VTE. Results should be correlated with clinical presentation.   I-stat troponin, ED     Status: None    Collection Time: 04/28/17  9:33 AM  Result Value Ref Range   Troponin i, poc 0.00 0.00 -  0.08 ng/mL   Comment 3            Comment: Due to the release kinetics of cTnI, a negative result within the first hours of the onset of symptoms does not rule out myocardial infarction with certainty. If myocardial infarction is still suspected, repeat the test at appropriate intervals.   GC/Chlamydia probe amp ([place])not at [facility]     Status: None   Collection Time: 05/26/17 12:00 AM  Result Value Ref Range   Chlamydia Negative     Comment: Normal Reference Range - Negative   Neisseria gonorrhea Negative     Comment: Normal Reference Range - Negative  Urinalysis, Routine w reflex microscopic     Status: Abnormal   Collection Time: 05/26/17  7:02 PM  Result Value Ref Range   Color, Urine YELLOW YELLOW   APPearance HAZY (A) CLEAR   Specific Gravity, Urine 1.018 1.005 - 1.030   pH 5.0 5.0 - 8.0   Glucose, UA NEGATIVE NEGATIVE mg/dL   Hgb urine dipstick SMALL (A) NEGATIVE   Bilirubin Urine NEGATIVE NEGATIVE   Ketones, ur NEGATIVE NEGATIVE mg/dL   Protein, ur NEGATIVE NEGATIVE mg/dL   Nitrite POSITIVE (A) NEGATIVE   Leukocytes, UA TRACE (A) NEGATIVE   RBC / HPF 0-5 0 - 5 RBC/hpf   WBC, UA TOO NUMEROUS TO COUNT 0 - 5 WBC/hpf   Bacteria, UA MANY (A) NONE SEEN   Squamous Epithelial / LPF 0-5 (A) NONE SEEN   Mucous PRESENT   Urine Culture     Status: Abnormal   Collection Time: 05/26/17  7:02 PM  Result Value Ref Range   Specimen Description URINE, CLEAN CATCH    Special Requests Normal    Culture >=100,000 COLONIES/mL ESCHERICHIA COLI (A)    Report Status 05/29/2017 FINAL    Organism ID, Bacteria ESCHERICHIA COLI (A)       Susceptibility   Escherichia coli - MIC*    AMPICILLIN <=2 SENSITIVE Sensitive     CEFAZOLIN <=4 SENSITIVE Sensitive     CEFTRIAXONE <=1 SENSITIVE Sensitive     CIPROFLOXACIN <=0.25 SENSITIVE Sensitive     GENTAMICIN <=1 SENSITIVE Sensitive     IMIPENEM <=0.25  SENSITIVE Sensitive     NITROFURANTOIN <=16 SENSITIVE Sensitive     TRIMETH/SULFA <=20 SENSITIVE Sensitive     AMPICILLIN/SULBACTAM <=2 SENSITIVE Sensitive     PIP/TAZO <=4 SENSITIVE Sensitive     Extended ESBL NEGATIVE Sensitive     * >=100,000 COLONIES/mL ESCHERICHIA COLI  Wet prep, genital     Status: Abnormal   Collection Time: 05/26/17  9:30 PM  Result Value Ref Range   Yeast Wet Prep HPF POC NONE SEEN NONE SEEN   Trich, Wet Prep NONE SEEN NONE SEEN   Clue Cells Wet Prep HPF POC NONE SEEN NONE SEEN   WBC, Wet Prep HPF POC MANY (A) NONE SEEN    Comment: FEW BACTERIA SEEN   Sperm NONE SEEN   HIV antibody (routine testing) (NOT for [facility])     Status: None   Collection Time: 05/26/17  9:51 PM  Result Value Ref Range   HIV Screen 4th Generation wRfx Non Reactive Non Reactive    Comment: (NOTE) Performed At: [facility] [address] [place], [place] [id number] Lindon Romp MD [id number]    Assessment and Plan  Acute cystitis with hematuria - Rx for Macrobid 100 mg po BID x 7 days - UTI instructions given - Discussed will treat for UTI d/t  CCUA results, but will call if need to change abx after UCx results come back  - F/U with PCP  Discharge home Patient verbalized an understanding of the plan of care and agrees.  Laury Deep, [place], CNM 05/26/2017, 11:10 PM

## 2017-06-06 ENCOUNTER — Emergency Department (HOSPITAL_COMMUNITY): Payer: Medicaid Other

## 2017-06-06 ENCOUNTER — Emergency Department (HOSPITAL_COMMUNITY)
Admission: EM | Admit: 2017-06-06 | Discharge: 2017-06-06 | Disposition: A | Discharge status: Home / Self Care | Payer: Medicaid Other | Attending: Emergency Medicine | Admitting: Emergency Medicine

## 2017-06-06 ENCOUNTER — Encounter (HOSPITAL_COMMUNITY): Payer: Self-pay | Admitting: *Deleted

## 2017-06-06 DIAGNOSIS — Z5321 Procedure and treatment not carried out due to patient leaving prior to being seen by health care provider: Secondary | ICD-10-CM | POA: Diagnosis not present

## 2017-06-06 DIAGNOSIS — R079 Chest pain, unspecified: Secondary | ICD-10-CM | POA: Diagnosis not present

## 2017-06-06 DIAGNOSIS — R05 Cough: Secondary | ICD-10-CM | POA: Diagnosis not present

## 2017-06-06 LAB — BASIC METABOLIC PANEL
Anion gap: 10 (ref 5–15)
BUN: 9 mg/dL (ref 6–20)
CO2: 25 mmol/L (ref 22–32)
Calcium: 9.1 mg/dL (ref 8.9–10.3)
Chloride: 103 mmol/L (ref 101–111)
Creatinine, Ser: 0.83 mg/dL (ref 0.44–1.00)
GFR calc Af Amer: >60 mL/min (ref >60)
GFR calc non Af Amer: >60 mL/min (ref >60)
Glucose, Bld: 86 mg/dL (ref 65–99)
Potassium: 3.9 mmol/L (ref 3.5–5.1)
Sodium: 138 mmol/L (ref 135–145)

## 2017-06-06 LAB — CBC
HCT: 44.7 % (ref 36.0–46.0)
Hemoglobin: 15.3 g/dL — ABNORMAL HIGH (ref 12.0–15.0)
MCH: 31 pg (ref 26.0–34.0)
MCHC: 34.2 g/dL (ref 30.0–36.0)
MCV: 90.5 fL (ref 78.0–100.0)
Platelets: 223 10*3/uL (ref 150–400)
RBC: 4.94 MIL/uL (ref 3.87–5.11)
RDW: 13.8 % (ref 11.5–15.5)
WBC: 7.6 10*3/uL (ref 4.0–10.5)

## 2017-06-06 LAB — I-STAT TROPONIN, ED: Troponin i, poc: 0 ng/mL (ref 0.00–0.08)

## 2017-06-06 NOTE — ED Triage Notes (Signed)
Pt came in EMS for chest pain that developed suddenly and has cough.  Pt states she has had a chronic cough.  Pt took 324mg  of ASA at home.

## 2017-06-06 NOTE — ED Notes (Signed)
Patient left without speaking with Nurse First. Waited 4h 76m

## 2017-06-09 ENCOUNTER — Ambulatory Visit: Payer: Self-pay | Admitting: Registered"

## 2017-06-13 ENCOUNTER — Emergency Department (HOSPITAL_COMMUNITY): Payer: Medicaid Other

## 2017-06-13 ENCOUNTER — Encounter (HOSPITAL_COMMUNITY): Payer: Self-pay | Admitting: Emergency Medicine

## 2017-06-13 ENCOUNTER — Emergency Department (HOSPITAL_COMMUNITY)
Admission: EM | Admit: 2017-06-13 | Discharge: 2017-06-13 | Disposition: A | Discharge status: Home / Self Care | Payer: Medicaid Other | Attending: Emergency Medicine | Admitting: Emergency Medicine

## 2017-06-13 DIAGNOSIS — Z79899 Other long term (current) drug therapy: Secondary | ICD-10-CM | POA: Diagnosis not present

## 2017-06-13 DIAGNOSIS — R0781 Pleurodynia: Secondary | ICD-10-CM | POA: Diagnosis not present

## 2017-06-13 DIAGNOSIS — R05 Cough: Secondary | ICD-10-CM | POA: Insufficient documentation

## 2017-06-13 DIAGNOSIS — R059 Cough, unspecified: Secondary | ICD-10-CM

## 2017-06-13 LAB — BASIC METABOLIC PANEL
Anion gap: 10 (ref 5–15)
BUN: 9 mg/dL (ref 6–20)
CO2: 22 mmol/L (ref 22–32)
Calcium: 9 mg/dL (ref 8.9–10.3)
Chloride: 105 mmol/L (ref 101–111)
Creatinine, Ser: 0.82 mg/dL (ref 0.44–1.00)
GFR calc Af Amer: >60 mL/min (ref >60)
GFR calc non Af Amer: >60 mL/min (ref >60)
Glucose, Bld: 82 mg/dL (ref 65–99)
Potassium: 4.7 mmol/L (ref 3.5–5.1)
Sodium: 137 mmol/L (ref 135–145)

## 2017-06-13 LAB — CBC
HCT: 47 % — ABNORMAL HIGH (ref 36.0–46.0)
Hemoglobin: 15.7 g/dL — ABNORMAL HIGH (ref 12.0–15.0)
MCH: 30.7 pg (ref 26.0–34.0)
MCHC: 33.4 g/dL (ref 30.0–36.0)
MCV: 92 fL (ref 78.0–100.0)
Platelets: 132 10*3/uL — ABNORMAL LOW (ref 150–400)
RBC: 5.11 MIL/uL (ref 3.87–5.11)
RDW: 13.9 % (ref 11.5–15.5)
WBC: 6.6 10*3/uL (ref 4.0–10.5)

## 2017-06-13 LAB — I-STAT TROPONIN, ED: Troponin i, poc: 0 ng/mL (ref 0.00–0.08)

## 2017-06-13 MED ORDER — BENZONATATE 100 MG PO CAPS: 100.0000 mg | ORAL_CAPSULE | Freq: Three times a day (TID) | ORAL | 0 refills | Status: DC

## 2017-06-13 MED ORDER — ALBUTEROL SULFATE HFA 108 (90 BASE) MCG/ACT IN AERS: 1.0000 | INHALATION_SPRAY | Freq: Four times a day (QID) | RESPIRATORY_TRACT | 0 refills | Status: DC | PRN

## 2017-06-13 NOTE — ED Triage Notes (Signed)
Pt reports cough x4 months along with chest pain, states cough has been treated with multiple meds but still remains, states that chest pain began this am when she got up to get ready. Pt a/ox4, resp e/u, nad.

## 2017-06-13 NOTE — ED Provider Notes (Signed)
Emergency Department Provider Note   I have reviewed the triage vital signs and the nursing notes.   HISTORY  Chief Complaint Cough and Chest Pain   HPI Jordan [name] is a 51 y.o. female with PMH of Bipolar disorder, CAD, and obesity presents to the emergency department for evaluation of persistent cough and associated chest pain. Symptoms have been ongoing for the last 4 months. Patient states that she has been evaluated in the emergency department multiple times with similar symptoms. Her symptoms are worse at night. She has not started any new medications including blood pressure medication. She has tried inhalers, cough pills, and over-the-counter medications with no relief. She states that she was treated for bronchitis which made little to no difference. She has a primary care physician who told her she would no longer see her. Patient is waiting for a Medicaid card so she can reestablish with another primary care physician.No change in symptoms recently to bring the patient to the ED today. No radiation of pain.   Past Medical History:  Diagnosis Date  . Alcohol abuse   . Anemia    patient denies  . Bipolar 1 disorder (HCC)    No medications currently  . CAP (community acquired pneumonia) 03/17/2015  . 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 Mental disorder   . Obesity   . PICC line infection 05/17/2015  . Sepsis due to Gram negative bacteria (MDR E Coli) 02/18/2015  . UTI (lower urinary tract infection)   . Vaginal Pap smear, abnormal     Patient Active Problem List   Diagnosis Date Noted  . Post-operative state 03/11/2017  . Painful lumpy right breast 07/27/2016  . Urinary tract infection 07/01/2015  . Diastolic dysfunction 70/62/3762  . Constipation 05/12/2015  . ESBL (extended spectrum beta-lactamase) producing bacteria infection 03/17/2015  . Diarrhea 03/03/2015  . Weakness 03/02/2015  . 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 Generalized  anxiety disorder 02/20/2015  . Claustrophobia 02/20/2015  . Transaminitis 02/18/2015  . Chest pain 02/18/2015  . Abdominal pain   . Dizziness   . Hypotension 01/04/2015  . Bipolar disorder ([place]) 06/29/2012    Past Surgical History:  Procedure Laterality Date  . CERVICAL CONIZATION W/BX N/A 07/01/2016   Procedure: CONIZATION CERVIX WITH BIOPSY;  Surgeon: Chancy Milroy, MD;  Location: [facility];  Service: Gynecology;  Laterality: N/A;  . CHOLECYSTECTOMY    . HYSTEROSCOPY W/D&C N/A 01/25/2016   Procedure: DILATATION AND CURETTAGE /HYSTEROSCOPY;  Surgeon: Mora Bellman, MD;  Location: [facility];  Service: Gynecology;  Laterality: N/A;  . VAGINAL HYSTERECTOMY N/A 03/11/2017   Procedure: HYSTERECTOMY VAGINAL WITH MORCELLATION;  Surgeon: Chancy Milroy, MD;  Location: [facility];  Service: Gynecology;  Laterality: N/A;    Current Outpatient Rx  . Order #: [id number] Class: Historical Med  . Order #: [id number] Class: Print  . Order #: [id number] Class: Print  . Order #: [id number] Class: Print  . Order #: [id number] Class: Print  . Order #: [id number] Class: Print  . Order #: [id number] Class: Normal  . Order #: [id number] Class: Print    Allergies Citalopram; Lamotrigine; Sulfa antibiotics; and Tramadol  Family History  Problem Relation Age of Onset  . Alcohol abuse Father   . Cancer Father   . Breast cancer Neg Hx     Social History Social History  Substance Use Topics  . Smoking status: Never Smoker  . Smokeless tobacco: Never Used  . Alcohol use No    Review of Systems  Constitutional: No fever/chills  Eyes: No visual changes. ENT: No sore throat. Cardiovascular: Positive chest pain. Respiratory: Denies shortness of breath. Positive cough.  Gastrointestinal: No abdominal pain.  No nausea, no vomiting.  No diarrhea.  No constipation. Genitourinary: Negative for dysuria. Musculoskeletal: Negative for back pain. Skin: Negative for rash. Neurological: Negative for headaches, focal  weakness or numbness.  10-point ROS otherwise negative.  ____________________________________________   PHYSICAL EXAM:  VITAL SIGNS: ED Triage Vitals  Enc Vitals Group     BP 06/13/17 0955 (!) 138/116     Pulse Rate 06/13/17 0955 (!) 102     Resp 06/13/17 0955 18     Temp 06/13/17 0955 98 F (36.7 C)     Temp Source 06/13/17 0955 Oral     SpO2 06/13/17 0955 94 %     Pain Score 06/13/17 0953 10   Constitutional: Alert and oriented. Well appearing and in no acute distress.  Eyes: Conjunctivae are normal.  Head: Atraumatic. Nose: No congestion/rhinnorhea. Mouth/Throat: Mucous membranes are moist.  Neck: No stridor. Cardiovascular: Normal rate, regular rhythm. Good peripheral circulation. Grossly normal heart sounds.   Respiratory: Normal respiratory effort.  No retractions. Lungs slightly diminished throughout with no wheezing.  Gastrointestinal: Soft and nontender. No distention.  Musculoskeletal: No lower extremity tenderness nor edema. No gross deformities of extremities. Neurologic:  Normal speech and language. No gross focal neurologic deficits are appreciated.  Skin:  Skin is warm, dry and intact. No rash noted.  ____________________________________________   LABS (all labs ordered are listed, but only abnormal results are displayed)  Labs Reviewed  CBC - Abnormal; Notable for the following:       Result Value   Hemoglobin 15.7 (*)    HCT 47.0 (*)    Platelets 132 (*)    All other components within normal limits  BASIC METABOLIC PANEL  I-STAT TROPONIN, ED   ____________________________________________  EKG   EKG Interpretation  Date/Time:  Friday June 13 2017 09:57:45 EDT Ventricular Rate:  99 PR Interval:  138 QRS Duration: 74 QT Interval:  344 QTC Calculation: 441 R Axis:   67 Text Interpretation:  Normal sinus rhythm Normal ECG No STEMI.  Confirmed by Nanda Quinton [phone]) on 06/13/2017 1:12:09 PM        ____________________________________________  RADIOLOGY  Dg Chest 2 View  Result Date: 06/13/2017 CLINICAL DATA:  51 year old female with cough and shortness of breath. EXAM: CHEST  2 VIEW COMPARISON:  06/06/2017 FINDINGS: The heart size and mediastinal contours are within normal limits. Note is again made of bibasilar atelectasis. The lungs are otherwise clear. The visualized skeletal structures are unremarkable. IMPRESSION: Bibasilar atelectasis without active cardiopulmonary disease. Electronically Signed   By: Kristopher Oppenheim M.D.   On: 06/13/2017 10:45    ____________________________________________   PROCEDURES  Procedure(s) performed:   Procedures  None ____________________________________________   INITIAL IMPRESSION / ASSESSMENT AND PLAN / ED COURSE  Pertinent labs & imaging results that were available during my care of the patient were reviewed by me and considered in my medical decision making (see chart for details).  Patient presents to the emergency department for evaluation of chronic cough and chest pain. Chest pain only with coughing. Coughing worse at night. Patient with no acute distress. EKG is unremarkable. X-ray and lab work are negative. With 4 months of intermittent pain and pain only with coughing will not trend troponin. No indication for CT imaging of the chest. Plan for discharge with albuterol and tessalon.   At this time, I do not  feel there is any life-threatening condition present. I have reviewed and discussed all results (EKG, imaging, lab, urine as appropriate), exam findings with patient. I have reviewed nursing notes and appropriate previous records.  I feel the patient is safe to be discharged home without further emergent workup. Discussed usual and customary return precautions. Patient and family (if present) verbalize understanding and are comfortable with this plan.  Patient will follow-up with their primary care provider. If they do not  have a primary care provider, information for follow-up has been provided to them. All questions have been answered.  ____________________________________________  FINAL CLINICAL IMPRESSION(S) / ED DIAGNOSES  Final diagnoses:  Cough  Pleurodynia     MEDICATIONS GIVEN DURING THIS VISIT:  Medications - No data to display   NEW OUTPATIENT MEDICATIONS STARTED DURING THIS VISIT:  Discharge Medication List as of 06/13/2017  1:15 PM    START taking these medications   Details  albuterol (PROVENTIL HFA;VENTOLIN HFA) 108 (90 Base) MCG/ACT inhaler Inhale 1-2 puffs into the lungs every 6 (six) hours as needed for wheezing or shortness of breath., Starting Fri 06/13/2017, Print       Note:  This document was prepared using Dragon voice recognition software and may include unintentional dictation errors.  Nanda Quinton, MD Emergency Medicine   Naksh Radi, [facility], MD 06/13/17 [phone]

## 2017-06-13 NOTE — ED Notes (Signed)
Patient upset about being discharged and removing vital sign monitoring equipment.

## 2017-06-13 NOTE — Discharge Instructions (Signed)

## 2017-06-16 ENCOUNTER — Encounter (HOSPITAL_COMMUNITY): Payer: Self-pay | Admitting: *Deleted

## 2017-06-16 ENCOUNTER — Ambulatory Visit (HOSPITAL_COMMUNITY)
Admission: EM | Admit: 2017-06-16 | Discharge: 2017-06-16 | Disposition: A | Discharge status: Home / Self Care | Payer: Medicaid Other | Attending: Family Medicine | Admitting: Family Medicine

## 2017-06-16 ENCOUNTER — Ambulatory Visit: Payer: Self-pay | Admitting: Registered"

## 2017-06-16 DIAGNOSIS — J00 Acute nasopharyngitis [common cold]: Secondary | ICD-10-CM | POA: Diagnosis not present

## 2017-06-16 MED ORDER — IPRATROPIUM BROMIDE 0.06 % NA SOLN: 2.0000 | Freq: Four times a day (QID) | NASAL | 0 refills | Status: DC

## 2017-06-16 NOTE — ED Provider Notes (Signed)
[place]   [id number] 06/16/17 Arrival Time: 3716  ASSESSMENT & PLAN:  1. Acute nasopharyngitis     Meds ordered this encounter  Medications  . ipratropium (ATROVENT) 0.06 % nasal spray    Sig: Place 2 sprays into both nostrils 4 (four) times daily.    Dispense:  15 mL    Refill:  0    Order Specific Question:   Supervising Provider    Answer:   Sherlene Shams [[id number]]   Push po fluids, rest, tylenol and motrin otc prn as directed for fever, arthralgias, and myalgias.  Follow up prn if sx's continue or persist. Reviewed expectations re: course of current medical issues. Questions answered. Outlined signs and symptoms indicating need for more acute intervention. Patient verbalized understanding. After Visit Summary given.   SUBJECTIVE:  Jordan [name] is a 51 y.o. female who presents with complaint of sinus congestion cough and uri sx's  ROS: As per HPI.   OBJECTIVE:  Vitals:   06/16/17 1913  BP: 130/78  Pulse: 91  Resp: 19  Temp: 98.8 F (37.1 C)  TempSrc: Oral  SpO2: 97%     General appearance: alert; no distress Eyes: PERRLA; EOMI; conjunctiva normal HENT: normocephalic; atraumatic; TMs normal; nasal mucosa normal; oral mucosa normal Neck: supple Lungs: clear to auscultation bilaterally Heart: regular rate and rhythm Abdomen: soft, non-tender; bowel sounds normal; no masses or organomegaly; no guarding or rebound tenderness Back: no CVA tenderness Extremities: no cyanosis or edema; symmetrical with no gross deformities Skin: warm and dry Neurologic: normal gait; normal symmetric reflexes Psychological: alert and cooperative; normal mood and affect  Past Medical History:  Diagnosis Date  . Alcohol abuse   . Anemia    patient denies  . Bipolar 1 disorder (HCC)    No medications currently  . CAP (community acquired pneumonia) 03/17/2015  . 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 Mental disorder   . Obesity   . PICC  line infection 05/17/2015  . Sepsis due to Gram negative bacteria (MDR E Coli) 02/18/2015  . UTI (lower urinary tract infection)   . Vaginal Pap smear, abnormal      has a past medical history of Alcohol abuse; Anemia; Bipolar 1 disorder ([place]); CAP (community acquired pneumonia) (03/17/2015); Megaloblastic anemia (02/22/2015); Mental disorder; Obesity; PICC line infection (05/17/2015); Sepsis due to Gram negative bacteria (MDR E Coli) (02/18/2015); UTI (lower urinary tract infection); and Vaginal Pap smear, abnormal.  Results for orders placed or performed during the hospital encounter of 81/01/75  Basic metabolic panel  Result Value Ref Range   Sodium 137 135 - 145 mmol/L   Potassium 4.7 3.5 - 5.1 mmol/L   Chloride 105 101 - 111 mmol/L   CO2 22 22 - 32 mmol/L   Glucose, Bld 82 65 - 99 mg/dL   BUN 9 6 - 20 mg/dL   Creatinine, Ser 0.82 0.44 - 1.00 mg/dL   Calcium 9.0 8.9 - 10.3 mg/dL   GFR calc non Af Amer >60 >60 mL/min   GFR calc Af Amer >60 >60 mL/min   Anion gap 10 5 - 15  CBC  Result Value Ref Range   WBC 6.6 4.0 - 10.5 K/uL   RBC 5.11 3.87 - 5.11 MIL/uL   Hemoglobin 15.7 (H) 12.0 - 15.0 g/dL   HCT 47.0 (H) 36.0 - 46.0 %   MCV 92.0 78.0 - 100.0 fL   MCH 30.7 26.0 - 34.0 pg   MCHC 33.4 30.0 -  36.0 g/dL   RDW 13.9 11.5 - 15.5 %   Platelets 132 (L) 150 - 400 K/uL  I-stat troponin, ED  Result Value Ref Range   Troponin i, poc 0.00 0.00 - 0.08 ng/mL   Comment 3            Labs Reviewed - No data to display  Imaging: No results found.  Allergies  Allergen Reactions  . Citalopram Other (See Comments)    Possible cause of pancytopenia. Swelling of tongue, face and throat  . Lamotrigine Other (See Comments)    Possible cause of pancytopenia. Swelling of face, throat and tongue  . Sulfa Antibiotics Anaphylaxis  . Tramadol Anaphylaxis, Shortness Of Breath and Swelling    Family History  Problem Relation Age of Onset  . Alcohol abuse Father   . Cancer Father   . Breast  cancer Neg Hx    Past Surgical History:  Procedure Laterality Date  . CERVICAL CONIZATION W/BX N/A 07/01/2016   Procedure: CONIZATION CERVIX WITH BIOPSY;  Surgeon: Chancy Milroy, MD;  Location: [facility];  Service: Gynecology;  Laterality: N/A;  . CHOLECYSTECTOMY    . HYSTEROSCOPY W/D&C N/A 01/25/2016   Procedure: DILATATION AND CURETTAGE /HYSTEROSCOPY;  Surgeon: Mora Bellman, MD;  Location: [facility];  Service: Gynecology;  Laterality: N/A;  . VAGINAL HYSTERECTOMY N/A 03/11/2017   Procedure: HYSTERECTOMY VAGINAL WITH MORCELLATION;  Surgeon: Chancy Milroy, MD;  Location: [facility];  Service: Gynecology;  Laterality: N/A;         Lysbeth Penner, FNP 06/16/17 1946

## 2017-06-16 NOTE — ED Triage Notes (Signed)
Patient states she has been seen for this several times over the last couple months: reports cough, hoarse voice, phlegm, and chest cold. States was diagnosed with PNA and bronchitis.

## 2017-06-27 ENCOUNTER — Emergency Department (HOSPITAL_COMMUNITY)
Admission: EM | Admit: 2017-06-27 | Discharge: 2017-06-27 | Disposition: A | Discharge status: Home / Self Care | Payer: Medicaid Other | Attending: Emergency Medicine | Admitting: Emergency Medicine

## 2017-06-27 ENCOUNTER — Encounter (HOSPITAL_COMMUNITY): Payer: Self-pay

## 2017-06-27 DIAGNOSIS — M542 Cervicalgia: Secondary | ICD-10-CM | POA: Diagnosis present

## 2017-06-27 DIAGNOSIS — M436 Torticollis: Secondary | ICD-10-CM | POA: Insufficient documentation

## 2017-06-27 MED ORDER — IBUPROFEN 800 MG PO TABS: 800.0000 mg | ORAL_TABLET | Freq: Three times a day (TID) | ORAL | 0 refills | Status: DC | PRN

## 2017-06-27 MED ORDER — IBUPROFEN 800 MG PO TABS
800.0000 mg | ORAL_TABLET | Freq: Once | ORAL | Status: AC
  Administered 2017-06-27: 800 mg via ORAL
  Filled 2017-06-27: qty 1

## 2017-06-27 NOTE — ED Triage Notes (Signed)
Per EMS- Patient states she has had left neck pain x 2 days. Patient denies any injury or falls.

## 2017-06-27 NOTE — ED Provider Notes (Signed)
[facility] DEPT Provider Note   CSN: [id number] Arrival date & time: 06/27/17  0935     History   Chief Complaint Chief Complaint  Patient presents with  . Neck Pain    HPI Jordan [name] is a 51 y.o. female.  The history is provided by the patient. No language interpreter was used.   Jordan [name] is a 51 y.o. female who presents to the Emergency Department complaining of neck pain.  She reports left-sided posterior neck pain that started 2 days ago. No recent injuries. She has pain to the back side of her left neck that is sharp and stabbing in nature. It is worse with range of motion of her neck. She denies any fevers, shortness of breath, cough, numbness, weakness. No prior similar symptoms. She tried Tylenol at home in a hot shower with no significant improvement. She denies any tobacco, alcohol, drug use. Past Medical History:  Diagnosis Date  . Alcohol abuse   . Anemia    patient denies  . Bipolar 1 disorder (HCC)    No medications currently  . CAP (community acquired pneumonia) 03/17/2015  . 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 Mental disorder   . Obesity   . PICC line infection 05/17/2015  . Sepsis due to Gram negative bacteria (MDR E Coli) 02/18/2015  . UTI (lower urinary tract infection)   . Vaginal Pap smear, abnormal     Patient Active Problem List   Diagnosis Date Noted  . Post-operative state 03/11/2017  . Painful lumpy right breast 07/27/2016  . Urinary tract infection 07/01/2015  . Diastolic dysfunction 02/54/2706  . Constipation 05/12/2015  . ESBL (extended spectrum beta-lactamase) producing bacteria infection 03/17/2015  . Diarrhea 03/03/2015  . Weakness 03/02/2015  . 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 Generalized anxiety disorder 02/20/2015  . Claustrophobia 02/20/2015  . Transaminitis 02/18/2015  . Chest pain 02/18/2015  . Abdominal pain   . Dizziness   . Hypotension 01/04/2015  . Bipolar disorder ([place]) 06/29/2012     Past Surgical History:  Procedure Laterality Date  . CERVICAL CONIZATION W/BX N/A 07/01/2016   Procedure: CONIZATION CERVIX WITH BIOPSY;  Surgeon: Chancy Milroy, MD;  Location: [facility];  Service: Gynecology;  Laterality: N/A;  . CHOLECYSTECTOMY    . HYSTEROSCOPY W/D&C N/A 01/25/2016   Procedure: DILATATION AND CURETTAGE /HYSTEROSCOPY;  Surgeon: Mora Bellman, MD;  Location: [facility];  Service: Gynecology;  Laterality: N/A;  . VAGINAL HYSTERECTOMY N/A 03/11/2017   Procedure: HYSTERECTOMY VAGINAL WITH MORCELLATION;  Surgeon: Chancy Milroy, MD;  Location: [facility];  Service: Gynecology;  Laterality: N/A;    OB History    Gravida Para Term Preterm AB Living   1 1 1     1    SAB TAB Ectopic Multiple Live Births           1       Home Medications    Prior to Admission medications   Medication Sig Start Date End Date Taking? Authorizing Provider  acetaminophen (TYLENOL) 500 MG tablet Take 1,000 mg by mouth every 6 (six) hours as needed for moderate pain.   Yes [provider]  albuterol (PROVENTIL HFA;VENTOLIN HFA) 108 (90 Base) MCG/ACT inhaler Inhale 1-2 puffs into the lungs every 6 (six) hours as needed for wheezing or shortness of breath. Patient not taking: Reported on 06/27/2017 06/13/17   Long, Wonda Olds, MD  azithromycin (ZITHROMAX) 250 MG tablet Take 1 tablet (250 mg total) by mouth daily. Take first  2 tablets together, then 1 every day until finished. Patient not taking: Reported on 06/27/2017 04/25/17   Fransico Meadow, PA-C  benzonatate (TESSALON) 100 MG capsule Take 1 capsule (100 mg total) by mouth every 8 (eight) hours. Patient not taking: Reported on 06/27/2017 06/13/17   Margette Fast, MD  cetirizine (ZYRTEC ALLERGY) 10 MG tablet Take 1 tablet (10 mg total) by mouth daily. Patient not taking: Reported on 06/27/2017 05/05/17   Waynetta Pean, PA-C  fluticasone [facility]) 50 MCG/ACT nasal spray Place 2 sprays into both nostrils daily. Patient not taking: Reported on 06/27/2017  05/05/17   Waynetta Pean, PA-C  ibuprofen (ADVIL,MOTRIN) 800 MG tablet Take 1 tablet (800 mg total) by mouth every 8 (eight) hours as needed. 06/27/17   Quintella Reichert, MD  ipratropium (ATROVENT) 0.06 % nasal spray Place 2 sprays into both nostrils 4 (four) times daily. Patient not taking: Reported on 06/27/2017 06/16/17   Lysbeth Penner, FNP  naproxen (NAPROSYN) 375 MG tablet Take 1 tablet (375 mg total) by mouth 2 (two) times daily. Patient not taking: Reported on 06/27/2017 04/12/17   Doristine Devoid, PA-C    Family History Family History  Problem Relation Age of Onset  . Alcohol abuse Father   . Cancer Father   . Breast cancer Neg Hx     Social History Social History  Substance Use Topics  . Smoking status: Never Smoker  . Smokeless tobacco: Never Used  . Alcohol use No     Allergies   Citalopram; Lamotrigine; Sulfa antibiotics; and Tramadol   Review of Systems Review of Systems  All other systems reviewed and are negative.    Physical Exam Updated Vital Signs BP (!) 129/95 (BP Location: Left Arm)   Pulse 98   Temp 98.1 F (36.7 C) (Oral)   Resp 16   Ht 5\' 8"  (1.727 m)   Wt (!) 163.3 kg (360 lb)   LMP  (LMP Unknown) Comment: negative pregnancy test result 09-20-16  SpO2 96%   BMI 54.74 kg/m   Physical Exam  Constitutional: She is oriented to person, place, and time. She appears well-developed and well-nourished.  HENT:  Head: Normocephalic and atraumatic.  Mouth/Throat: Oropharynx is clear and moist.  Right TM is clear. Left TM with scarring, no erythema  Neck:  There is no bony tenderness in the cervical spine. There is soft tissue tenderness to the left posterior lateral neck. There is no scalp tenderness to palpation. No overlying rashes. No carotid bruits. No cervical adenopathy  Cardiovascular: Normal rate and regular rhythm.   No murmur heard. Pulmonary/Chest: Effort normal. No respiratory distress.  Occasional fine crackles  Abdominal: Soft.  There is no tenderness. There is no rebound and no guarding.  Musculoskeletal: She exhibits no edema or tenderness.  Neurological: She is alert and oriented to person, place, and time.  5 out of 5 strength in all 4 extremities. Sensation to light touch intact in all 4 extremities  Skin: Skin is warm and dry.  Psychiatric: She has a normal mood and affect. Her behavior is normal.  Nursing note and vitals reviewed.    ED Treatments / Results  Labs (all labs ordered are listed, but only abnormal results are displayed) Labs Reviewed - No data to display  EKG  EKG Interpretation None       Radiology No results found.  Procedures Procedures (including critical care time)  Medications Ordered in ED Medications  ibuprofen (ADVIL,MOTRIN) tablet 800 mg (800 mg Oral Given  06/27/17 1038)     Initial Impression / Assessment and Plan / ED Course  I have reviewed the triage vital signs and the nursing notes.  Pertinent labs & imaging results that were available during my care of the patient were reviewed by me and considered in my medical decision making (see chart for details).   she presents for evaluation of left posterior neck pain. There is no evidence of acute infectious process or acute injury. No evidence of shingles. She has good range of motion of the neck with no neurologic deficits.  Discussed with patient home care for torticollis. Discussed ibuprofen, may add Tylenol, warm compresses, activity as tolerated. Discussed outpatient follow-up and return precautions. Patient was noted to have fine crackles on her lung exam, prior imaging demonstrating evidence of possible lung disease. Discussed with patient importance of PCP follow-up, may benefit from having a pulmonologist. She has no respiratory symptoms in the emergency department.  Final Clinical Impressions(s) / ED Diagnoses   Final diagnoses:  Torticollis    New Prescriptions New Prescriptions   IBUPROFEN (ADVIL,MOTRIN)  800 MG TABLET    Take 1 tablet (800 mg total) by mouth every 8 (eight) hours as needed.     Quintella Reichert, MD 06/27/17 1050

## 2017-07-03 ENCOUNTER — Emergency Department (HOSPITAL_COMMUNITY)
Admission: EM | Admit: 2017-07-03 | Discharge: 2017-07-04 | Disposition: A | Discharge status: Home / Self Care | Payer: Medicaid Other | Attending: Emergency Medicine | Admitting: Emergency Medicine

## 2017-07-03 ENCOUNTER — Encounter (HOSPITAL_COMMUNITY): Payer: Self-pay | Admitting: Nurse Practitioner

## 2017-07-03 ENCOUNTER — Emergency Department (HOSPITAL_COMMUNITY): Payer: Medicaid Other

## 2017-07-03 DIAGNOSIS — W19XXXA Unspecified fall, initial encounter: Secondary | ICD-10-CM

## 2017-07-03 DIAGNOSIS — Y999 Unspecified external cause status: Secondary | ICD-10-CM | POA: Insufficient documentation

## 2017-07-03 DIAGNOSIS — S32020A Wedge compression fracture of second lumbar vertebra, initial encounter for closed fracture: Secondary | ICD-10-CM | POA: Insufficient documentation

## 2017-07-03 DIAGNOSIS — W010XXA Fall on same level from slipping, tripping and stumbling without subsequent striking against object, initial encounter: Secondary | ICD-10-CM | POA: Diagnosis not present

## 2017-07-03 DIAGNOSIS — S3992XA Unspecified injury of lower back, initial encounter: Secondary | ICD-10-CM | POA: Diagnosis present

## 2017-07-03 DIAGNOSIS — Y939 Activity, unspecified: Secondary | ICD-10-CM | POA: Insufficient documentation

## 2017-07-03 DIAGNOSIS — Y929 Unspecified place or not applicable: Secondary | ICD-10-CM | POA: Insufficient documentation

## 2017-07-03 MED ORDER — HYDROCODONE-ACETAMINOPHEN 5-325 MG PO TABS
1.0000 | ORAL_TABLET | Freq: Once | ORAL | Status: AC
  Administered 2017-07-03: 1 via ORAL
  Filled 2017-07-03: qty 1

## 2017-07-03 MED ORDER — ACETAMINOPHEN 500 MG PO TABS
1000.0000 mg | ORAL_TABLET | Freq: Once | ORAL | Status: AC
  Administered 2017-07-03: 1000 mg via ORAL
  Filled 2017-07-03: qty 2

## 2017-07-03 MED ORDER — IBUPROFEN 400 MG PO TABS
400.0000 mg | ORAL_TABLET | Freq: Once | ORAL | Status: AC
Start: 2017-07-03 — End: 2017-07-03
  Administered 2017-07-03: 400 mg via ORAL
  Filled 2017-07-03: qty 1

## 2017-07-03 MED ORDER — HYDROCODONE-ACETAMINOPHEN 5-325 MG PO TABS: 2.0000 | ORAL_TABLET | Freq: Four times a day (QID) | ORAL | 0 refills | Status: AC | PRN

## 2017-07-03 NOTE — ED Provider Notes (Signed)
[facility] DEPT Provider Note   CSN: [id number] Arrival date & time: 07/03/17  1904     History   Chief Complaint Chief Complaint  Patient presents with  . Fall    HPI Jordan [name] is a 51 y.o. female.  This is a 51 year old female with PMH of obesity, bipolar disorder, who presents after a fall at home in her apartment.  Patient states she was leaving her apartment and slipped on a plastic mat outside because it is raining and she fell onto her lower back.  She has sacral pain but denies any urinary incontinence, denies any lower extremity numbness or weakness.  Denies other injuries, denies LOC or head injury.   The history is provided by the patient and the EMS personnel.    Past Medical History:  Diagnosis Date  . Alcohol abuse   . Anemia    patient denies  . Bipolar 1 disorder (HCC)    No medications currently  . CAP (community acquired pneumonia) 03/17/2015  . 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 Mental disorder   . Obesity   . PICC line infection 05/17/2015  . Sepsis due to Gram negative bacteria (MDR E Coli) 02/18/2015  . UTI (lower urinary tract infection)   . Vaginal Pap smear, abnormal     Patient Active Problem List   Diagnosis Date Noted  . Post-operative state 03/11/2017  . Painful lumpy right breast 07/27/2016  . Urinary tract infection 07/01/2015  . Diastolic dysfunction 40/34/7425  . Constipation 05/12/2015  . ESBL (extended spectrum beta-lactamase) producing bacteria infection 03/17/2015  . Diarrhea 03/03/2015  . Weakness 03/02/2015  . 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 Generalized anxiety disorder 02/20/2015  . Claustrophobia 02/20/2015  . Transaminitis 02/18/2015  . Chest pain 02/18/2015  . Abdominal pain   . Dizziness   . Hypotension 01/04/2015  . Bipolar disorder ([name]) 06/29/2012    Past Surgical History:  Procedure Laterality Date  . CERVICAL CONIZATION W/BX N/A 07/01/2016   Procedure: CONIZATION CERVIX  WITH BIOPSY;  Surgeon: Chancy Milroy, MD;  Location: [facility];  Service: Gynecology;  Laterality: N/A;  . CHOLECYSTECTOMY    . HYSTEROSCOPY W/D&C N/A 01/25/2016   Procedure: DILATATION AND CURETTAGE /HYSTEROSCOPY;  Surgeon: Mora Bellman, MD;  Location: [facility];  Service: Gynecology;  Laterality: N/A;  . VAGINAL HYSTERECTOMY N/A 03/11/2017   Procedure: HYSTERECTOMY VAGINAL WITH MORCELLATION;  Surgeon: Chancy Milroy, MD;  Location: [facility];  Service: Gynecology;  Laterality: N/A;    OB History    Gravida Para Term Preterm AB Living   1 1 1     1    SAB TAB Ectopic Multiple Live Births           1       Home Medications    Prior to Admission medications   Medication Sig Start Date End Date Taking? Authorizing Provider  acetaminophen (TYLENOL) 500 MG tablet Take 1,000 mg by mouth every 6 (six) hours as needed for moderate pain.   Yes [provider]  ibuprofen (ADVIL,MOTRIN) 800 MG tablet Take 1 tablet (800 mg total) by mouth every 8 (eight) hours as needed. 06/27/17  Yes Quintella Reichert, MD  albuterol (PROVENTIL HFA;VENTOLIN HFA) 108 (90 Base) MCG/ACT inhaler Inhale 1-2 puffs into the lungs every 6 (six) hours as needed for wheezing or shortness of breath. Patient not taking: Reported on 07/03/2017 06/13/17   Margette Fast, MD  azithromycin (ZITHROMAX) 250 MG tablet Take 1 tablet (250 mg  total) by mouth daily. Take first 2 tablets together, then 1 every day until finished. Patient not taking: Reported on 06/27/2017 04/25/17   Fransico Meadow, PA-C  benzonatate (TESSALON) 100 MG capsule Take 1 capsule (100 mg total) by mouth every 8 (eight) hours. Patient not taking: Reported on 06/27/2017 06/13/17   Margette Fast, MD  cetirizine (ZYRTEC ALLERGY) 10 MG tablet Take 1 tablet (10 mg total) by mouth daily. Patient not taking: Reported on 06/27/2017 05/05/17   Waynetta Pean, PA-C  fluticasone [facility]) 50 MCG/ACT nasal spray Place 2 sprays into both nostrils daily. Patient not taking: Reported  on 06/27/2017 05/05/17   Waynetta Pean, PA-C  HYDROcodone-acetaminophen (NORCO/VICODIN) 5-325 MG tablet Take 2 tablets by mouth every 6 (six) hours as needed for moderate pain. 07/03/17 07/06/17  Aldona Lento, MD  ipratropium (ATROVENT) 0.06 % nasal spray Place 2 sprays into both nostrils 4 (four) times daily. Patient not taking: Reported on 06/27/2017 06/16/17   Lysbeth Penner, FNP  naproxen (NAPROSYN) 375 MG tablet Take 1 tablet (375 mg total) by mouth 2 (two) times daily. Patient not taking: Reported on 06/27/2017 04/12/17   Doristine Devoid, PA-C    Family History Family History  Problem Relation Age of Onset  . Alcohol abuse Father   . Cancer Father   . Breast cancer Neg Hx     Social History Social History  Substance Use Topics  . Smoking status: Never Smoker  . Smokeless tobacco: Never Used  . Alcohol use No     Allergies   Citalopram; Lamotrigine; Sulfa antibiotics; and Tramadol   Review of Systems Review of Systems  Constitutional: Negative for chills and fever.  HENT: Negative for ear pain and sore throat.   Eyes: Negative for pain and visual disturbance.  Respiratory: Negative for cough and shortness of breath.   Cardiovascular: Negative for chest pain and palpitations.  Gastrointestinal: Negative for abdominal pain and vomiting.  Genitourinary: Negative for dysuria and hematuria.  Musculoskeletal: Positive for back pain. Negative for arthralgias, joint swelling, myalgias, neck pain and neck stiffness.  Skin: Negative for color change and rash.  Neurological: Negative for seizures and syncope.  All other systems reviewed and are negative.    Physical Exam Updated Vital Signs BP 128/84 (BP Location: Right Arm)   Pulse 96   Temp 98.7 F (37.1 C)   Resp 18   Ht 5\' 8"  (1.727 m)   Wt (!) 163.3 kg (360 lb)   LMP  (LMP Unknown) Comment: negative pregnancy test result 09-20-16  SpO2 98%   BMI 54.74 kg/m   Physical Exam  Constitutional: She appears  well-nourished. No distress.  HENT:  Head: Normocephalic and atraumatic.  Eyes: Conjunctivae are normal.  Neck: Neck supple.  Cardiovascular: Normal rate and regular rhythm.   No murmur heard. Pulmonary/Chest: Effort normal and breath sounds normal. No respiratory distress.  Abdominal: Soft. There is no tenderness.  Genitourinary: Rectal exam shows no external hemorrhoid, no internal hemorrhoid, no fissure, no mass, no tenderness, anal tone normal and guaiac negative stool.  Musculoskeletal: She exhibits no edema.       Cervical back: She exhibits no tenderness and no pain.       Thoracic back: She exhibits no tenderness, no pain and no spasm.       Lumbar back: She exhibits tenderness, bony tenderness, pain and spasm. She exhibits no swelling and no deformity.  Neurological: She is alert. She has normal strength. She displays no tremor. No cranial  nerve deficit or sensory deficit. Coordination normal.  Skin: Skin is warm and dry. She is not diaphoretic.  Nursing note and vitals reviewed.   ED Treatments / Results  Labs (all labs ordered are listed, but only abnormal results are displayed) Labs Reviewed - No data to display  EKG  EKG Interpretation None       Radiology Dg Lumbar Spine 2-3 Views  Result Date: 07/03/2017 CLINICAL DATA:  Low back pain since a fall today. Initial encounter. EXAM: LUMBAR SPINE - 2-3 VIEW COMPARISON:  MRI lumbar spine 04/27/2017. FINDINGS: The patient has a mild superior endplate compression fracture of L2 which is new since the prior examinations. Vertebral body height loss is estimated at 10%. No bony retropulsion or involvement of the posterior elements is identified. Vertebral body height is otherwise maintained. Intervertebral disc space height is normal. IMPRESSION: Acute mild anterior endplate compression fracture of L2. No bony retropulsion or involvement of the posterior elements is seen. Electronically Signed   By: Inge Rise M.D.   On:  07/03/2017 20:22   Dg Pelvis 1-2 Views  Result Date: 07/03/2017 CLINICAL DATA:  Patient fell today, complaining of left hip and low back pain. EXAM: PELVIS - 1-2 VIEW COMPARISON:  09/21/2016 CT FINDINGS: Fine bony detail is slightly limited by patient body habitus. There is no evidence of an acute displaced pelvic fracture or diastasis. No pelvic bone lesions are seen. IMPRESSION: No acute pelvic or hip fracture is noted. Electronically Signed   By: Ashley Royalty M.D.   On: 07/03/2017 20:26    Procedures Procedures (including critical care time)  Medications Ordered in ED Medications  ibuprofen (ADVIL,MOTRIN) tablet 400 mg (400 mg Oral Given 07/03/17 1947)  acetaminophen (TYLENOL) tablet 1,000 mg (1,000 mg Oral Given 07/03/17 1947)  HYDROcodone-acetaminophen (NORCO/VICODIN) 5-325 MG per tablet 1 tablet (1 tablet Oral Given 07/03/17 2254)     Initial Impression / Assessment and Plan / ED Course  I have reviewed the triage vital signs and the nursing notes.  Pertinent labs & imaging results that were available during my care of the patient were reviewed by me and considered in my medical decision making (see chart for details).     This is a 51 year old female with PMH of obesity, bipolar disorder, who presents after a fall at home in her apartment.    Upon arrival patient stated she was in acute pain but fully verbal and able to slide off the stretcher onto the bed without difficulty.  She was lying on her side and able to move to her back without pain.  Exam as noted above with no focal neurological deficits.  No sciatic distribution of pain.  No fever, no rash, no obvious trauma to the lower back. Low concern at this time for spinal cord pathology given exam and history.  Lumbar x-ray performed and demonstrates acute L2 compression fracture with no bony retropulsion.  Patient reassessed and her neurological exam was unremarkable with no focal deficits.  Rectal tone normal. No CT  indicated at this time.   Patient given Ibuprofen and baclofen for pain while in the ED.  Rx written for Norco- patient received Norco in ED and had no reaction (history of Tramadol allergy). [place] database researched prior to Rx assignment.  Patient discharged with PCP and Spine follow up. All questions answered prior to discharge. Return precautions given.  Final Clinical Impressions(s) / ED Diagnoses   Final diagnoses:  Fall, initial encounter  Closed compression fracture of second lumbar  vertebra, initial encounter ([place])    New Prescriptions New Prescriptions   HYDROCODONE-ACETAMINOPHEN (NORCO/VICODIN) 5-325 MG TABLET    Take 2 tablets by mouth every 6 (six) hours as needed for moderate pain.     Aldona Lento, MD 07/03/17 [place]    Carmin Muskrat, MD 07/04/17 [phone]

## 2017-07-03 NOTE — ED Triage Notes (Signed)
Per EMS pt from home was walking up concrete wheelchair ramp and slipped and fell on lower back. Denies LOC or any other injuries. Pt denies numbness, tingling, pt sts tender back and pain worse with movement. Pt screaming when moving

## 2017-07-05 ENCOUNTER — Emergency Department (HOSPITAL_COMMUNITY): Payer: Medicaid Other

## 2017-07-05 ENCOUNTER — Emergency Department (HOSPITAL_COMMUNITY)
Admission: EM | Admit: 2017-07-05 | Discharge: 2017-07-05 | Disposition: A | Discharge status: Home / Self Care | Payer: Medicaid Other | Attending: Emergency Medicine | Admitting: Emergency Medicine

## 2017-07-05 DIAGNOSIS — S32020D Wedge compression fracture of second lumbar vertebra, subsequent encounter for fracture with routine healing: Secondary | ICD-10-CM | POA: Diagnosis not present

## 2017-07-05 DIAGNOSIS — Y929 Unspecified place or not applicable: Secondary | ICD-10-CM | POA: Diagnosis not present

## 2017-07-05 DIAGNOSIS — W19XXXA Unspecified fall, initial encounter: Secondary | ICD-10-CM | POA: Diagnosis not present

## 2017-07-05 DIAGNOSIS — Y92009 Unspecified place in unspecified non-institutional (private) residence as the place of occurrence of the external cause: Secondary | ICD-10-CM

## 2017-07-05 DIAGNOSIS — Y939 Activity, unspecified: Secondary | ICD-10-CM | POA: Insufficient documentation

## 2017-07-05 DIAGNOSIS — M545 Low back pain, unspecified: Secondary | ICD-10-CM

## 2017-07-05 DIAGNOSIS — Y999 Unspecified external cause status: Secondary | ICD-10-CM | POA: Diagnosis not present

## 2017-07-05 DIAGNOSIS — S3992XD Unspecified injury of lower back, subsequent encounter: Secondary | ICD-10-CM | POA: Diagnosis present

## 2017-07-05 DIAGNOSIS — W19XXXD Unspecified fall, subsequent encounter: Secondary | ICD-10-CM

## 2017-07-05 MED ORDER — HYDROMORPHONE HCL 4 MG PO TABS: 2.0000 mg | ORAL_TABLET | ORAL | 0 refills | Status: DC | PRN

## 2017-07-05 MED ORDER — OXYCODONE-ACETAMINOPHEN 5-325 MG PO TABS
2.0000 | ORAL_TABLET | Freq: Once | ORAL | Status: AC
  Administered 2017-07-05: 2 via ORAL
  Filled 2017-07-05: qty 2

## 2017-07-05 MED ORDER — HYDROMORPHONE HCL 2 MG PO TABS
2.0000 mg | ORAL_TABLET | Freq: Once | ORAL | Status: AC
  Administered 2017-07-05: 2 mg via ORAL
  Filled 2017-07-05: qty 1

## 2017-07-05 MED ORDER — HYDROMORPHONE HCL 1 MG/ML IJ SOLN
1.0000 mg | Freq: Once | INTRAMUSCULAR | Status: AC
  Administered 2017-07-05: 1 mg via INTRAMUSCULAR
  Filled 2017-07-05: qty 1

## 2017-07-05 MED ORDER — CYCLOBENZAPRINE HCL 10 MG PO TABS
10.0000 mg | ORAL_TABLET | Freq: Once | ORAL | Status: AC
  Administered 2017-07-05: 10 mg via ORAL
  Filled 2017-07-05: qty 1

## 2017-07-05 MED ORDER — CYCLOBENZAPRINE HCL 10 MG PO TABS: 10.0000 mg | ORAL_TABLET | Freq: Three times a day (TID) | ORAL | 0 refills | Status: DC | PRN

## 2017-07-05 NOTE — Discharge Instructions (Signed)
Apply ice several times a day. You may take ibuprofen and/or acetaminophen to get additional pain relief.

## 2017-07-05 NOTE — ED Provider Notes (Signed)
Pt concerned that she is still having pain.  Explained to patient that unfortunately she will continue to have pain from her injury but the pain medications should help alleviate but not eliminate the pain.  Went over medication dosages.  Pt was concerned that she was getting a 2 mg dilaudid tablet which is a lower mg than the 10 mg hydrocodone tablet, and since it was a lower mg it would not work.  We went over the differences in medications dosing and that the dilaudid was a stronger medication.  Pt has been able to walk at home with her walker.  Encouraged her to continue that.  She can also take ibuprofen or naprosyn.   Dorie Rank, MD 07/05/17 [phone]

## 2017-07-05 NOTE — ED Provider Notes (Signed)
[facility] DEPT Provider Note   CSN: [id number] Arrival date & time: 07/05/17  0411     History   Chief Complaint Chief Complaint  Patient presents with  . Tailbone Pain    injury seen at [facility] thursday     HPI Jordan [name] is a 51 y.o. female.  The history is provided by the patient.  She had been seen in the ED 2 days ago following a fall, diagnosed with fractured tailbone, sent home with prescription for hydrocodone-acetaminophen for pain. She states that she is not getting adequate relief of pain. She rates her pain at 10/10 and it does not improve even a little bit with taking the hydrocodone. She had another fall yesterday. She denies any weakness, numbness, tingling. She denies any bowel or bladder dysfunction. She denies saddle anesthesia.  Past Medical History:  Diagnosis Date  . Alcohol abuse   . Anemia    patient denies  . Bipolar 1 disorder (HCC)    No medications currently  . CAP (community acquired pneumonia) 03/17/2015  . 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 Mental disorder   . Obesity   . PICC line infection 05/17/2015  . Sepsis due to Gram negative bacteria (MDR E Coli) 02/18/2015  . UTI (lower urinary tract infection)   . Vaginal Pap smear, abnormal     Patient Active Problem List   Diagnosis Date Noted  . Post-operative state 03/11/2017  . Painful lumpy right breast 07/27/2016  . Urinary tract infection 07/01/2015  . Diastolic dysfunction 67/89/3810  . Constipation 05/12/2015  . ESBL (extended spectrum beta-lactamase) producing bacteria infection 03/17/2015  . Diarrhea 03/03/2015  . Weakness 03/02/2015  . 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 Generalized anxiety disorder 02/20/2015  . Claustrophobia 02/20/2015  . Transaminitis 02/18/2015  . Chest pain 02/18/2015  . Abdominal pain   . Dizziness   . Hypotension 01/04/2015  . Bipolar disorder ([place]) 06/29/2012    Past Surgical History:  Procedure Laterality Date    . CERVICAL CONIZATION W/BX N/A 07/01/2016   Procedure: CONIZATION CERVIX WITH BIOPSY;  Surgeon: Chancy Milroy, MD;  Location: [facility];  Service: Gynecology;  Laterality: N/A;  . CHOLECYSTECTOMY    . HYSTEROSCOPY W/D&C N/A 01/25/2016   Procedure: DILATATION AND CURETTAGE /HYSTEROSCOPY;  Surgeon: Mora Bellman, MD;  Location: [facility];  Service: Gynecology;  Laterality: N/A;  . VAGINAL HYSTERECTOMY N/A 03/11/2017   Procedure: HYSTERECTOMY VAGINAL WITH MORCELLATION;  Surgeon: Chancy Milroy, MD;  Location: [facility];  Service: Gynecology;  Laterality: N/A;    OB History    Gravida Para Term Preterm AB Living   1 1 1     1    SAB TAB Ectopic Multiple Live Births           1       Home Medications    Prior to Admission medications   Medication Sig Start Date End Date Taking? Authorizing Provider  acetaminophen (TYLENOL) 500 MG tablet Take 1,000 mg by mouth every 6 (six) hours as needed for moderate pain.    [provider]  albuterol (PROVENTIL HFA;VENTOLIN HFA) 108 (90 Base) MCG/ACT inhaler Inhale 1-2 puffs into the lungs every 6 (six) hours as needed for wheezing or shortness of breath. Patient not taking: Reported on 07/03/2017 06/13/17   Long, Wonda Olds, MD  azithromycin (ZITHROMAX) 250 MG tablet Take 1 tablet (250 mg total) by mouth daily. Take first 2 tablets together, then 1 every day until finished. Patient not  taking: Reported on 06/27/2017 04/25/17   Fransico Meadow, PA-C  benzonatate (TESSALON) 100 MG capsule Take 1 capsule (100 mg total) by mouth every 8 (eight) hours. Patient not taking: Reported on 06/27/2017 06/13/17   Margette Fast, MD  cetirizine (ZYRTEC ALLERGY) 10 MG tablet Take 1 tablet (10 mg total) by mouth daily. Patient not taking: Reported on 06/27/2017 05/05/17   Waynetta Pean, PA-C  fluticasone [facility]) 50 MCG/ACT nasal spray Place 2 sprays into both nostrils daily. Patient not taking: Reported on 06/27/2017 05/05/17   Waynetta Pean, PA-C  HYDROcodone-acetaminophen  (NORCO/VICODIN) 5-325 MG tablet Take 2 tablets by mouth every 6 (six) hours as needed for moderate pain. 07/03/17 07/06/17  Aldona Lento, MD  ibuprofen (ADVIL,MOTRIN) 800 MG tablet Take 1 tablet (800 mg total) by mouth every 8 (eight) hours as needed. 06/27/17   Quintella Reichert, MD  ipratropium (ATROVENT) 0.06 % nasal spray Place 2 sprays into both nostrils 4 (four) times daily. Patient not taking: Reported on 06/27/2017 06/16/17   Lysbeth Penner, FNP  naproxen (NAPROSYN) 375 MG tablet Take 1 tablet (375 mg total) by mouth 2 (two) times daily. Patient not taking: Reported on 06/27/2017 04/12/17   Doristine Devoid, PA-C    Family History Family History  Problem Relation Age of Onset  . Alcohol abuse Father   . Cancer Father   . Breast cancer Neg Hx     Social History Social History  Substance Use Topics  . Smoking status: Never Smoker  . Smokeless tobacco: Never Used  . Alcohol use No     Allergies   Citalopram; Lamotrigine; Sulfa antibiotics; and Tramadol   Review of Systems Review of Systems  All other systems reviewed and are negative.    Physical Exam Updated Vital Signs BP 135/87 (BP Location: Left Arm)   Pulse 87   Temp 98.4 F (36.9 C) (Oral)   Resp 18   LMP  (LMP Unknown) Comment: negative pregnancy test result 09-20-16  SpO2 96%   Physical Exam  Nursing note and vitals reviewed.  51 year old female, resting comfortably and in no acute distress. Vital signs are normal. Oxygen saturation is 18%, which is normal. Head is normocephalic and atraumatic. PERRLA, EOMI. Oropharynx is clear. Neck is nontender and supple without adenopathy or JVD. Back has diffuse tenderness throughout the lumbar spine and into the sacral region. No area of point tenderness identified. There is no CVA tenderness. Lungs are clear without rales, wheezes, or rhonchi. Chest is nontender. Heart has regular rate and rhythm without murmur. Abdomen is soft, flat, nontender without masses or  hepatosplenomegaly and peristalsis is normoactive. There is no tenderness to palpation over the anterior pelvic brim. Extremities have no cyanosis or edema, full range of motion is present. Skin is warm and dry without rash. Neurologic: Mental status is normal, cranial nerves are intact, there are no motor or sensory deficits.  ED Treatments / Results   Radiology Dg Lumbar Spine 2-3 Views  Result Date: 07/03/2017 CLINICAL DATA:  Low back pain since a fall today. Initial encounter. EXAM: LUMBAR SPINE - 2-3 VIEW COMPARISON:  MRI lumbar spine 04/27/2017. FINDINGS: The patient has a mild superior endplate compression fracture of L2 which is new since the prior examinations. Vertebral body height loss is estimated at 10%. No bony retropulsion or involvement of the posterior elements is identified. Vertebral body height is otherwise maintained. Intervertebral disc space height is normal. IMPRESSION: Acute mild anterior endplate compression fracture of L2. No bony  retropulsion or involvement of the posterior elements is seen. Electronically Signed   By: Inge Rise M.D.   On: 07/03/2017 20:22   Dg Pelvis 1-2 Views  Result Date: 07/03/2017 CLINICAL DATA:  Patient fell today, complaining of left hip and low back pain. EXAM: PELVIS - 1-2 VIEW COMPARISON:  09/21/2016 CT FINDINGS: Fine bony detail is slightly limited by patient body habitus. There is no evidence of an acute displaced pelvic fracture or diastasis. No pelvic bone lesions are seen. IMPRESSION: No acute pelvic or hip fracture is noted. Electronically Signed   By: Ashley Royalty M.D.   On: 07/03/2017 20:26    Procedures Procedures (including critical care time)  Medications Ordered in ED Medications - No data to display   Initial Impression / Assessment and Plan / ED Course  I have reviewed the triage vital signs and the nursing notes.  Pertinent imaging results that were available during my care of the patient were reviewed by me and  considered in my medical decision making (see chart for details).  Persistent back and sacral pain following fall. Old records are reviewed and x-rays were reviewed. There is minimal compression of L2 seen on prior x-ray. No clear evidence of sacral or coccygeal fracture. She is given a dose of oxycodone have acetaminophen in the ED to see if she gets better pain relief, and will also be given a dose of cyclobenzaprine. She'll be sent for CT of pelvis to look for evidence of sacral or coccygeal fracture.  CT showed no evidence of any other fracture. Unfortunately, patient did not get significant relief with above noted treatment. She'll be given a dose of hydromorphone orally. She is requesting a wheelchair for use at home and prescription will be given for this. She does have a walker available. Case is signed out to Dr. Tomi Bamberger to evaluate response to oral hydromorphone.  Final Clinical Impressions(s) / ED Diagnoses   Final diagnoses:  Fall at home, subsequent encounter  Compression fracture of second lumbar vertebra with routine healing  Acute midline low back pain without sciatica    New Prescriptions New Prescriptions   No medications on file     Delora Fuel, MD 25/42/70 [phone]

## 2017-07-05 NOTE — ED Notes (Signed)
Transport called to d/c patient home.

## 2017-07-05 NOTE — ED Notes (Signed)
Bed: SL75 Expected date:  Expected time:  Means of arrival:  Comments: 51 yo F  Pain r/t broken coccyx

## 2017-07-05 NOTE — ED Triage Notes (Signed)
Pt comes to ed via ems, c/o pelvic pain from fall on Thursday 13th.  Pt comes from home, v/s in normal ranges. Pain score 8 out 10. V/s on arrival 122/92, pulse 100, rr18.  pt's home med's not working.  C/o of mobility limitations and numbness in pelvic region.

## 2017-07-15 NOTE — Progress Notes (Deleted)
Patient ID: Jordan [name], female   DOB: Mar 19, 1966, 51 y.o.   MRN: [id number] After being seen in the ED/urgent care over the last several months.  Most recently she was seen on 07/03/2017 and 07/05/2017 for a fall she sustained at home.  No LOC.  Initial visit after the fall, she was brought in via EMS.  No LOC.  From ED note 07/03/2017:    Upon arrival patient stated she was in acute pain but fully verbal and able to slide off the stretcher onto the bed without difficulty.  She was lying on her side and able to move to her back without pain.  Exam as noted above with no focal neurological deficits.  No sciatic distribution of pain.  No fever, no rash, no obvious trauma to the lower back. Low concern at this time for spinal cord pathology given exam and history.  Lumbar x-ray performed and demonstrates acute L2 compression fracture with no bony retropulsion.  Patient reassessed and her neurological exam was unremarkable with no focal deficits.  Rectal tone normal. No CT indicated at this time.   Patient given Ibuprofen and baclofen for pain while in the ED.  Rx written for Norco- patient received Norco in ED and had no reaction (history of Tramadol allergy). [facility] database researched prior to Rx assignment.  Patient discharged with PCP and Spine follow up. All questions answered prior to discharge. Return precautions given.  She went back to the ED 07/05/2017 because her pain wasn't controlled.  From ED note: Persistent back and sacral pain following fall. Old records are reviewed and x-rays were reviewed. There is minimal compression of L2 seen on prior x-ray. No clear evidence of sacral or coccygeal fracture. She is given a dose of oxycodone have acetaminophen in the ED to see if she gets better pain relief, and will also be given a dose of cyclobenzaprine. She'll be sent for CT of pelvis to look for evidence of sacral or coccygeal fracture.  CT showed no evidence of any other  fracture. Unfortunately, patient did not get significant relief with above noted treatment. She'll be given a dose of hydromorphone orally. She is requesting a wheelchair for use at home and prescription will be given for this. She does have a walker available. Case is signed out to Dr. Tomi Bamberger to evaluate response to oral hydromorphone.

## 2017-07-16 ENCOUNTER — Inpatient Hospital Stay: Payer: Self-pay

## 2017-07-19 ENCOUNTER — Emergency Department (HOSPITAL_COMMUNITY)
Admission: EM | Admit: 2017-07-19 | Discharge: 2017-07-19 | Disposition: A | Discharge status: Home / Self Care | Payer: Medicaid Other | Attending: Emergency Medicine | Admitting: Emergency Medicine

## 2017-07-19 ENCOUNTER — Encounter (HOSPITAL_COMMUNITY): Payer: Self-pay | Admitting: *Deleted

## 2017-07-19 DIAGNOSIS — Z5321 Procedure and treatment not carried out due to patient leaving prior to being seen by health care provider: Secondary | ICD-10-CM | POA: Diagnosis not present

## 2017-07-19 DIAGNOSIS — R220 Localized swelling, mass and lump, head: Secondary | ICD-10-CM | POA: Diagnosis not present

## 2017-07-19 NOTE — ED Notes (Signed)
PT request for IV to come out. State she is leaving ride on their way due to wait time. This Probation officer remove PT IV

## 2017-07-19 NOTE — ED Triage Notes (Signed)
Pt started on Sulfa drug yesterdaythen started to have allergic reaction, it is listed that she is allergic to sulfa in chart, at present airway fine, IV 20 LAC 50mg  Benadryl given, slight swelling to lip,

## 2017-08-18 IMAGING — DX DG FOOT COMPLETE 3+V*R*
3 series · 3 of 3 positions shown · non-contrast
Comparison: 06/19/2015, 11/07/2011

CLINICAL DATA: Foot injury, pain and swelling after a twisting
injury 4 days ago.

EXAM:
RIGHT FOOT COMPLETE - 3+ VIEW

[foot obl]
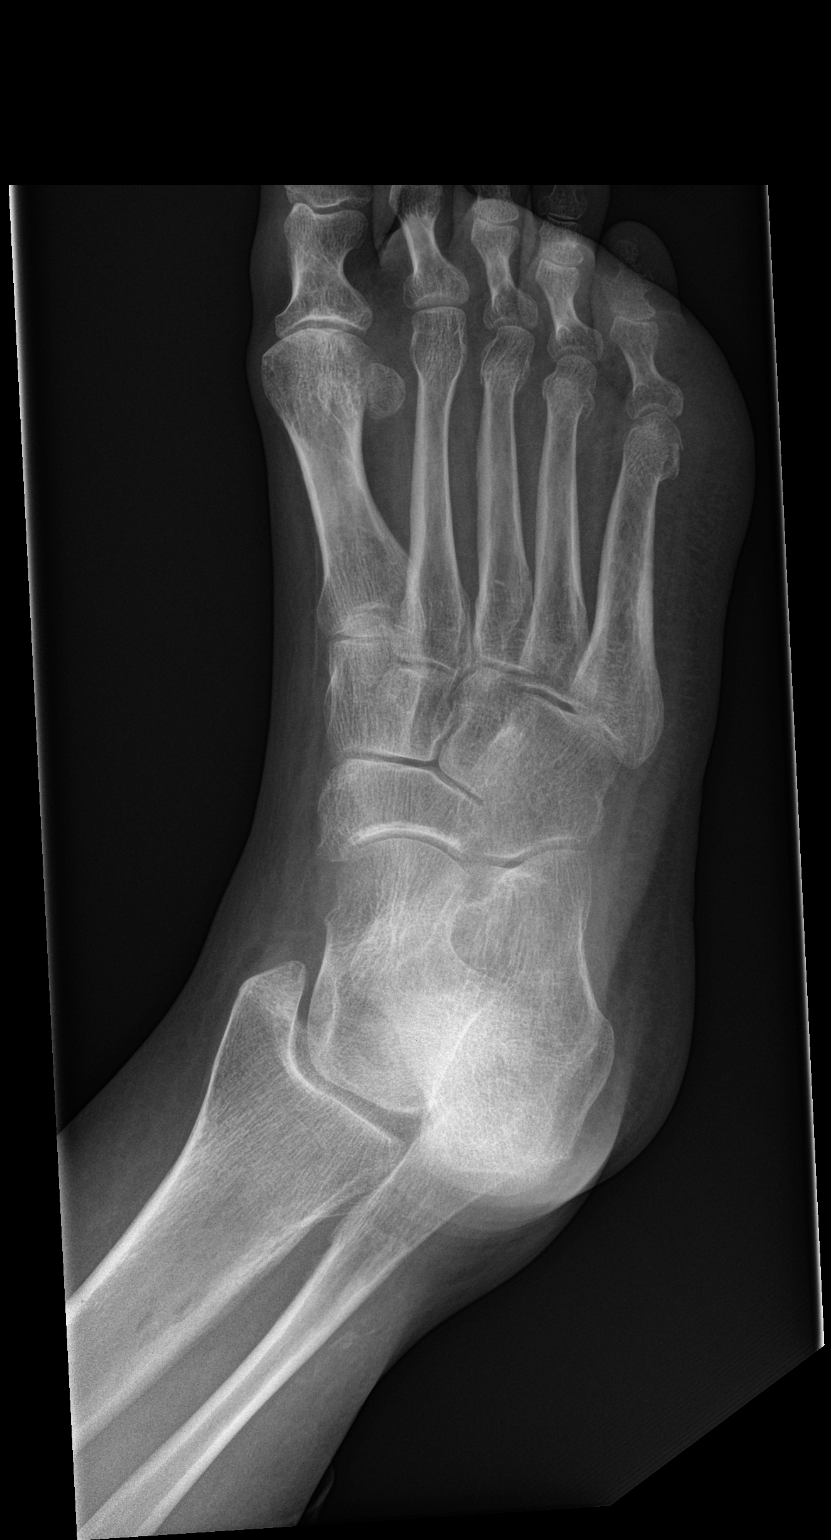

[foot lat]
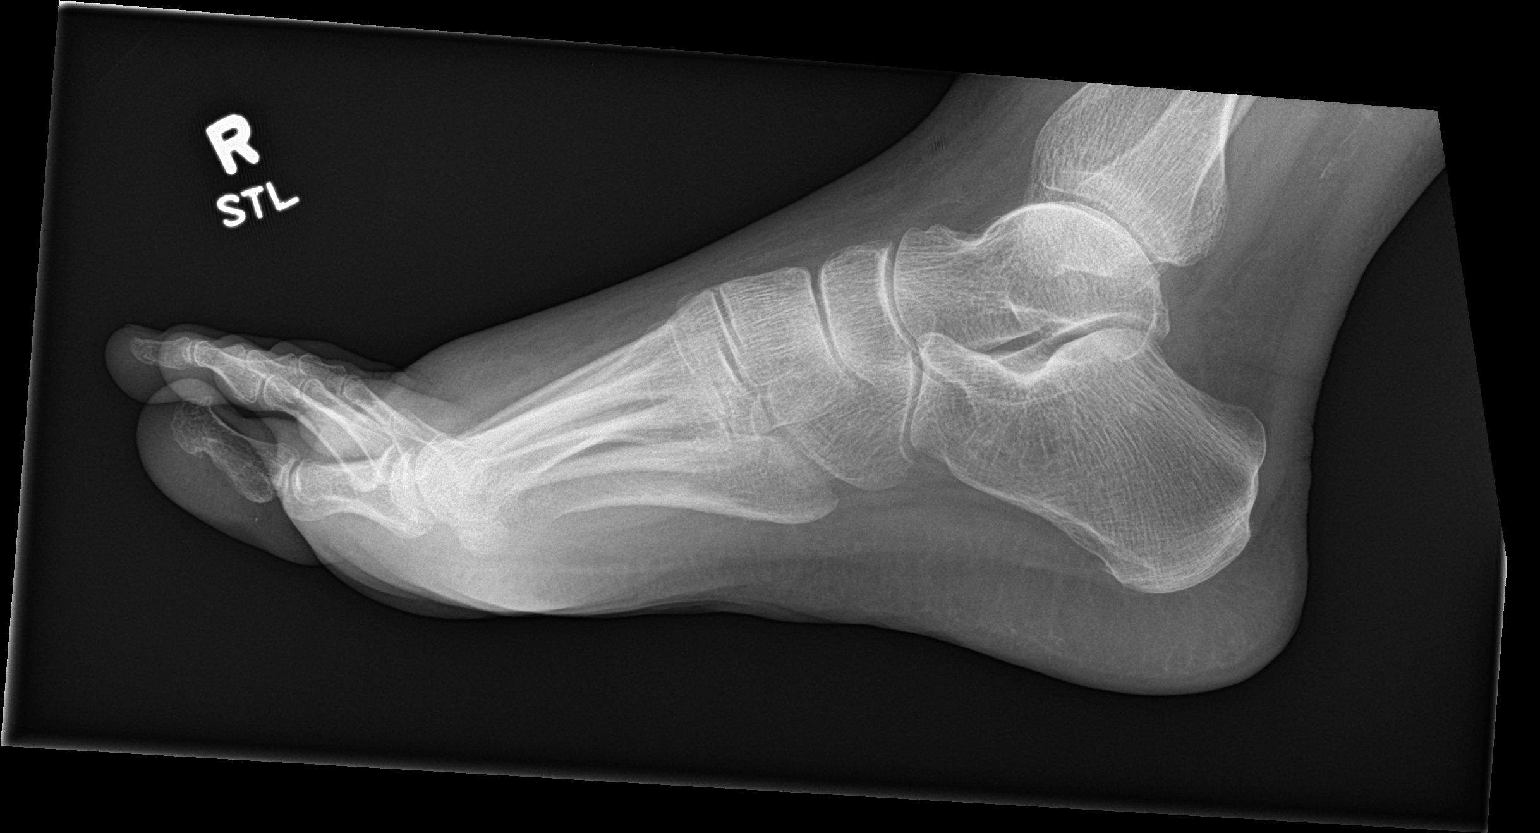

[foot ap]
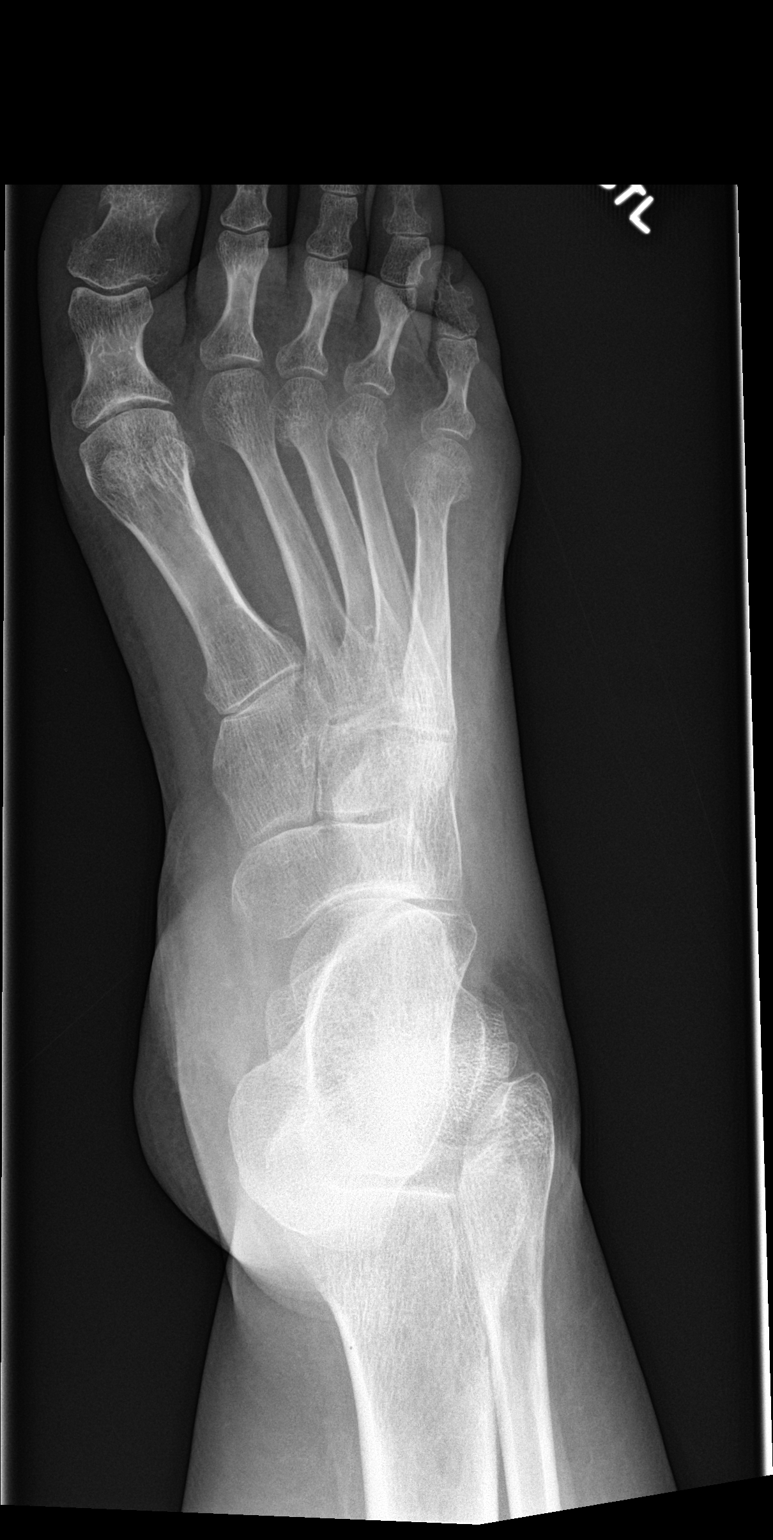

[3 of 3 positions shown; findings below may reference images not displayed]

FINDINGS: Re- demonstrated are nondisplaced fractures of the third, fourth and
fifth metatarsal necks. There is no other fracture or dislocation.
There is mild osteoarthritis of the first MTP joint. There is no
evidence of arthropathy or other focal bone abnormality. Soft
tissues are unremarkable.
IMPRESSION: Re- demonstrated are nondisplaced fractures of the third, fourth and
fifth right metatarsal necks.

## 2017-09-03 ENCOUNTER — Encounter: Payer: Self-pay | Admitting: Physical Therapy

## 2017-09-03 ENCOUNTER — Ambulatory Visit: Payer: Medicaid Other | Attending: Family Medicine | Admitting: Physical Therapy

## 2017-09-03 DIAGNOSIS — G8929 Other chronic pain: Secondary | ICD-10-CM | POA: Diagnosis present

## 2017-09-03 DIAGNOSIS — M6281 Muscle weakness (generalized): Secondary | ICD-10-CM | POA: Diagnosis present

## 2017-09-03 DIAGNOSIS — M545 Low back pain, unspecified: Secondary | ICD-10-CM

## 2017-09-03 DIAGNOSIS — R2689 Other abnormalities of gait and mobility: Secondary | ICD-10-CM | POA: Diagnosis present

## 2017-09-03 NOTE — Therapy (Signed)
[facility] [place], [place], [address] Phone: [phone]   Fax:  [phone]  Physical Therapy Evaluation  Patient Details  Name: Jordan [name] MRN: [id number] Date of Birth: 1966-01-02 Referring Provider: Elenore Paddy NP   Encounter Date: 09/03/2017  PT End of Session - 09/03/17 1634    Visit Number  1    Number of Visits  12 progressing to 12 after first authorization    Date for PT Re-Evaluation  10/01/17    Authorization Type  MCD: Re-assess / Re-submit to Medicaid after 4th visit.     PT Start Time  1546    PT Stop Time  1631    PT Time Calculation (min)  45 min    Activity Tolerance  Patient tolerated treatment well    Behavior During Therapy  WFL for tasks assessed/performed       Past Medical History:  Diagnosis Date  . Alcohol abuse   . Anemia    patient denies  . Bipolar 1 disorder (HCC)    No medications currently  . CAP (community acquired pneumonia) 03/17/2015  . 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 Mental disorder   . Obesity   . PICC line infection 05/17/2015  . Sepsis due to Gram negative bacteria (MDR E Coli) 02/18/2015  . UTI (lower urinary tract infection)   . Vaginal Pap smear, abnormal     Past Surgical History:  Procedure Laterality Date  . CHOLECYSTECTOMY      There were no vitals filed for this visit.   Subjective Assessment - 09/03/17 1555    Subjective  pt is a 51 y.o with CC low back pain that started about 3 months ago secondary to a fall when she was walking out her back door that occurred 3 months. pt reports pain stays in the low back, and radiates out to the L/R. since onset she reports fluctuation but overall staying about the same.     Limitations  Standing;Walking;House hold activities    How long can you sit comfortably?  30 min    How long can you stand comfortably?  with RW 30 - 60 min (without RW 30)    How long can you walk  comfortably?  with RW 30-60 min    Diagnostic tests  x-ray for low back    Patient Stated Goals  I want to walk again, be able to get into a regular car again,     Currently in Pain?  Yes    Pain Score  8  last took pain medication, 2PM, at workst 10/10    Pain Location  Back    Pain Orientation  Right;Left;Lower    Pain Descriptors / Indicators  Aching;Sore    Pain Type  Chronic pain    Pain Onset  More than a month ago    Pain Frequency  Intermittent    Aggravating Factors   prolonged walking/ standing/ sitting,    Pain Relieving Factors  medication, sitting up in bed, changing position    Effect of Pain on Daily Activities  limited endurance,          [facility] PT Assessment - 09/03/17 1600      Assessment   Medical Diagnosis  low back pain    Referring Provider  Elenore Paddy NP    Onset Date/Surgical Date  -- 3 months ago    Hand Dominance  Left    Next MD Visit  09/12/2017  Prior Therapy  no      Precautions   Precautions  None      Restrictions   Weight Bearing Restrictions  No      Balance Screen   Has the patient fallen in the past 6 months  Yes    How many times?  1    Has the patient had a decrease in activity level because of a fear of falling?   No    Is the patient reluctant to leave their home because of a fear of falling?   No      Home Environment   Living Environment  Private residence    Living Arrangements  Spouse/significant other    Type of [place] entrance;Stairs to enter ramped entrace in the back    Entrance Stairs-Number of Steps  3    Entrance Stairs-Rails  Can reach both    [place]  One level    [place] - 2 wheels;Wheelchair - Brewing technologist      Prior Function   Level of Independence  Independent with basic ADLs    Vocation  Unemployed;On disability    Leisure  watch TV, park, shopping      Cognition   Overall Cognitive Status  Within Functional Limits for tasks assessed       Posture/Postural Control   Posture/Postural Control  Postural limitations    Postural Limitations  Rounded Shoulders;Forward head      ROM / Strength   AROM / PROM / Strength  AROM;Strength      AROM   AROM Assessment Site  Hip;Knee;Lumbar    Right/Left Hip  Right;Left    Right/Left Knee  Right;Left    Lumbar Flexion  50    Lumbar Extension  10    Lumbar - Right Side Bend  8    Lumbar - Left Side Bend  8      Strength   Strength Assessment Site  Hip;Knee    Right/Left Hip  Right;Left    Right Hip Flexion  3+/5 pt demonstrate posterior trunk lean    Right Hip Extension  3+/5 in standing, bending over table    Right Hip ABduction  3+/5    Right Hip ADduction  3+/5    Left Hip Flexion  4-/5 with posterior trunk lean    Left Hip Extension  3+/5 in standing, bending over table    Left Hip ABduction  3+/5    Left Hip ADduction  3+/5    Right/Left Knee  Right;Left    Right Knee Flexion  4-/5    Right Knee Extension  3+/5    Left Knee Flexion  4-/5    Left Knee Extension  3+/5      Palpation   Palpation comment  TTP at bil L1-L5       Ambulation/Gait   Ambulation/Gait  Yes    Gait Pattern  Step-to pattern;Decreased stride length;Trendelenburg;Antalgic;Decreased trunk rotation;Trunk flexed with mid guard    Gait Comments  amb ~6 ft in room             Objective measurements completed on examination: See above findings.              PT Education - 09/03/17 1634    Education provided  Yes    Education Details  evaluation findings, POC, goals, HEP with proper form/ rationale, low back anatomy     [name](s) Educated  Patient    Methods  Explanation;Verbal cues;Handout    Comprehension  Verbalized understanding;Verbal cues required       PT Short Term Goals - 09/03/17 1931      PT SHORT TERM GOAL #1   Title  pt to be I with initial HEP    Baseline  no previous HEP    Time  3    Period  Weeks    Status  New    Target Date  09/24/17      PT SHORT TERM  GOAL #2   Title  pt to verbalize and demo proper posture with sitting/ standing and lifting activities to prevent and reduce low back pain     Baseline  no knowledge of proper posture or liflting mechanics.     Time  3    Period  Weeks    Status  New    Target Date  09/24/17      PT SHORT TERM GOAL #3   Title  pt to be able to stand and walk >/= 10 min LRAD with </= 6/10 pain to promote functional mobility therapeutic progression    Baseline  can walk 30 min with RW with 8-10/10 pain    Time  3    Period  Weeks    Status  New    Target Date  09/24/17        PT Long Term Goals - 09/03/17 1934      PT LONG TERM GOAL #1   Title  pt to increase overall trunk mobility by >/= 10 degrees in all planes to promote functional mobility required for ADLs    Baseline  flexion 50 degress, extension 10 degreees, and bil side bending 8 degrees    Time  6    Period  Weeks    Status  New    Target Date  10/15/17      PT LONG TERM GOAL #2   Title  increase bil LE strength to >/= 4/5 to promote hip stability and maximize safety with walking/ standing activities     Baseline  hip extension 3+/5, adduction/ abduction 3+/5 bil, R hp flexion 4-/5, L hip flexion 4-/5    Time  6    Period  Weeks    Status  New    Target Date  10/15/17      PT LONG TERM GOAL #3   Title  pt amb with LRAD for </= 30 min and be able to navigate >/= 6 steps with </= 3/10 pain to promote endurance and functional mobility required for ADLs    Baseline  walks / stands 30 with RW with 8-10/10 pan    Time  6    Period  Weeks    Status  New    Target Date  10/15/17      PT LONG TERM GOAL #4   Title  pt to be I with all HEP given as of last visit     Baseline  no previous HEP    Time  6    Period  Weeks    Status  New    Target Date  10/15/17             Plan - 09/03/17 1921    Clinical Impression Statement  pt presents to OPPT with CC of low back pain that startd 3 months ago due to a fall. she currently  utilizes a W/C to get around outside the house and uses  RW at home. Limited trunk mobility, and weakness in bil LE. She would benefit from physical therapy to improve trunk mobility, increase LE strength, maximze her mobility and function by addressing the deficits listed.     History and Personal Factors relevant to plan of care:  obesity, anemia, previous hx of limited mobility (using W/C)    Clinical Presentation  Evolving    Clinical Presentation due to:  limited trunk mobility, Low back pain, weakness,    Clinical Decision Making  Moderate    Rehab Potential  Good    PT Frequency  1x / week    PT Duration  3 weeks progressing to 2 x week for 6 weeks     PT Treatment/Interventions  ADLs/Self Care Home Management;Cryotherapy;Electrical Stimulation;Iontophoresis 4mg /ml Dexamethasone;Moist Heat;Ultrasound;Therapeutic exercise;Therapeutic activities;Patient/family education;Dry needling;Balance training;Neuromuscular re-education;Gait training;Manual techniques    PT Next Visit Plan  Review/ update HEP, core/ bil LE strengthening, pre-gait activities,     PT Home Exercise Plan  lower trunk rotation, seated marching with ADIM, seated clamshell, sit to stand with RW, ball squeezes    Consulted and Agree with Plan of Care  Patient       Patient will benefit from skilled therapeutic intervention in order to improve the following deficits and impairments:  Pain, Obesity, Decreased strength, Difficulty walking, Abnormal gait, Decreased endurance, Decreased balance, Decreased activity tolerance, Increased muscle spasms, Hypomobility, Postural dysfunction, Improper body mechanics  Visit Diagnosis: Chronic bilateral low back pain without sciatica  Muscle weakness (generalized)  Other abnormalities of gait and mobility     Problem List Patient Active Problem List   Diagnosis Date Noted  . Post-operative state 03/11/2017  . Painful lumpy right breast 07/27/2016  . Urinary tract infection  07/01/2015  . Diastolic dysfunction 88/89/1694  . Constipation 05/12/2015  . ESBL (extended spectrum beta-lactamase) producing bacteria infection 03/17/2015  . Diarrhea 03/03/2015  . Weakness 03/02/2015  . 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 Generalized anxiety disorder 02/20/2015  . Claustrophobia 02/20/2015  . Transaminitis 02/18/2015  . Chest pain 02/18/2015  . Abdominal pain   . Dizziness   . Hypotension 01/04/2015  . Bipolar disorder ([place]) 06/29/2012   Starr Lake PT, DPT, LAT, ATC  09/03/17  7:49 PM      [facility] [address] [place], [place], [address] Phone: [phone]   Fax:  [phone]  Name: Jerrika Ledlow MRN: [id number] Date of Birth: [birth year]

## 2017-09-15 ENCOUNTER — Ambulatory Visit: Payer: Medicaid Other | Admitting: Physical Therapy

## 2017-09-15 ENCOUNTER — Encounter: Payer: Self-pay | Admitting: Physical Therapy

## 2017-09-15 DIAGNOSIS — M6281 Muscle weakness (generalized): Secondary | ICD-10-CM

## 2017-09-15 DIAGNOSIS — G8929 Other chronic pain: Secondary | ICD-10-CM

## 2017-09-15 DIAGNOSIS — M545 Low back pain, unspecified: Secondary | ICD-10-CM

## 2017-09-15 DIAGNOSIS — R2689 Other abnormalities of gait and mobility: Secondary | ICD-10-CM

## 2017-09-15 NOTE — Therapy (Signed)
[place], [place], [address] Phone: [phone]   Fax:  [phone]  Physical Therapy Treatment  Patient Details  Name: Jordan [name] MRN: [id number] Date of Birth: 09-20-1966 Referring Provider: Elenore Paddy NP   Encounter Date: 09/15/2017  PT End of Session - 09/15/17 1508    Visit Number  2    Number of Visits  12    Date for PT Re-Evaluation  10/01/17    Authorization Type  MCD: Re-assess / Re-submit to Medicaid after 4th visit.     PT Start Time  1501    PT Stop Time  1546    PT Time Calculation (min)  45 min    Activity Tolerance  Patient tolerated treatment well       Past Medical History:  Diagnosis Date  . Alcohol abuse   . Anemia    patient denies  . Bipolar 1 disorder (HCC)    No medications currently  . CAP (community acquired pneumonia) 03/17/2015  . 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 Mental disorder   . Obesity   . PICC line infection 05/17/2015  . Sepsis due to Gram negative bacteria (MDR E Coli) 02/18/2015  . UTI (lower urinary tract infection)   . Vaginal Pap smear, abnormal     Past Surgical History:  Procedure Laterality Date  . CERVICAL CONIZATION W/BX N/A 07/01/2016   Procedure: CONIZATION CERVIX WITH BIOPSY;  Surgeon: Chancy Milroy, MD;  Location: [facility];  Service: Gynecology;  Laterality: N/A;  . CHOLECYSTECTOMY    . HYSTEROSCOPY W/D&C N/A 01/25/2016   Procedure: DILATATION AND CURETTAGE /HYSTEROSCOPY;  Surgeon: Mora Bellman, MD;  Location: [facility];  Service: Gynecology;  Laterality: N/A;  . VAGINAL HYSTERECTOMY N/A 03/11/2017   Procedure: HYSTERECTOMY VAGINAL WITH MORCELLATION;  Surgeon: Chancy Milroy, MD;  Location: [facility];  Service: Gynecology;  Laterality: N/A;    There were no vitals filed for this visit.  Subjective Assessment - 09/15/17 1503    Subjective  "I was doing some of the exercises but the HEP sheet got wet and I thew it  away. I feel like I am doing better today"     Currently in Pain?  Yes    Pain Score  7  last took pain meds at 8    Pain Location  Back    Pain Descriptors / Indicators  Aching    Pain Type  Chronic pain    Pain Onset  More than a month ago    Pain Frequency  Intermittent    Aggravating Factors   prolonged walking/ standing     Pain Relieving Factors  medication,                       [facility] Adult PT Treatment/Exercise - 09/15/17 1509      Lumbar Exercises: Stretches   Lower Trunk Rotation  -- 2 x 10    Pelvic Tilt  -- 1 x 10 holding for 2 sec    Quadruped Mid Back Stretch Limitations  sitting rolling out with hands on stool 2 x 30 sec hold      Lumbar Exercises: Seated   Sit to Stand  5 reps x 2 sets from elevated table    Other Seated Lumbar Exercises  horizontal lift/ chop 2 x 12 performed bil with green theraband      Knee/Hip Exercises: Standing   Gait Training  forward/ retrostepping  pre-gait exercises 2 x 10 bil with L/R foot leading      Knee/Hip Exercises: Seated   Ball Squeeze  1 x 10 with 3 sec hold    Clamshell with TheraBand  Green 2 x 15    Marching  Both;2 sets;10 reps green theraband          Balance Exercises - 09/15/17 1537      Balance Exercises: Standing   Standing Eyes Opened  Narrow base of support (BOS);Wide (BOA);4 reps;30 secs 2 times wide base, 2 x narrow base          PT Short Term Goals - 09/03/17 1931      PT SHORT TERM GOAL #1   Title  pt to be I with initial HEP    Baseline  no previous HEP    Time  3    Period  Weeks    Status  New    Target Date  09/24/17      PT SHORT TERM GOAL #2   Title  pt to verbalize and demo proper posture with sitting/ standing and lifting activities to prevent and reduce low back pain     Baseline  no knowledge of proper posture or liflting mechanics.     Time  3    Period  Weeks    Status  New    Target Date  09/24/17      PT SHORT TERM GOAL #3   Title  pt to be able to stand  and walk >/= 10 min LRAD with </= 6/10 pain to promote functional mobility therapeutic progression    Baseline  can walk 30 min with RW with 8-10/10 pain    Time  3    Period  Weeks    Status  New    Target Date  09/24/17        PT Long Term Goals - 09/03/17 1934      PT LONG TERM GOAL #1   Title  pt to increase overall trunk mobility by >/= 10 degrees in all planes to promote functional mobility required for ADLs    Baseline  flexion 50 degress, extension 10 degreees, and bil side bending 8 degrees    Time  6    Period  Weeks    Status  New    Target Date  10/15/17      PT LONG TERM GOAL #2   Title  increase bil LE strength to >/= 4/5 to promote hip stability and maximize safety with walking/ standing activities     Baseline  hip extension 3+/5, adduction/ abduction 3+/5 bil, R hp flexion 4-/5, L hip flexion 4-/5    Time  6    Period  Weeks    Status  New    Target Date  10/15/17      PT LONG TERM GOAL #3   Title  pt amb with LRAD for </= 30 min and be able to navigate >/= 6 steps with </= 3/10 pain to promote endurance and functional mobility required for ADLs    Baseline  walks / stands 30 with RW with 8-10/10 pan    Time  6    Period  Weeks    Status  New    Target Date  10/15/17      PT LONG TERM GOAL #4   Title  pt to be I with all HEP given as of last visit     Baseline  no previous HEP  Time  6    Period  Weeks    Status  New    Target Date  10/15/17            Plan - 09/15/17 1547    Clinical Impression Statement  pt reported she has been doing some of her HEP but reports continued pain inthe low back. reviewed previously provided HEP and reprinted exercises. She performed exercises today with minimal cues for proper form, but she fatigues quickly. She declined modalities and reported soreness but stated it was "good soreness.     PT Next Visit Plan  Review/ update HEP, core/ bil LE strengthening, pre-gait activities,     PT Home Exercise Plan  lower  trunk rotation, seated marching with ADIM, seated clamshell, sit to stand with RW, ball squeezes    Consulted and Agree with Plan of Care  Patient       Patient will benefit from skilled therapeutic intervention in order to improve the following deficits and impairments:  Pain, Obesity, Decreased strength, Difficulty walking, Abnormal gait, Decreased endurance, Decreased balance, Decreased activity tolerance, Increased muscle spasms, Hypomobility, Postural dysfunction, Improper body mechanics  Visit Diagnosis: Chronic bilateral low back pain without sciatica  Muscle weakness (generalized)  Other abnormalities of gait and mobility     Problem List Patient Active Problem List   Diagnosis Date Noted  . Post-operative state 03/11/2017  . Painful lumpy right breast 07/27/2016  . Urinary tract infection 07/01/2015  . Diastolic dysfunction 65/99/3570  . Constipation 05/12/2015  . ESBL (extended spectrum beta-lactamase) producing bacteria infection 03/17/2015  . Diarrhea 03/03/2015  . Weakness 03/02/2015  . 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 Generalized anxiety disorder 02/20/2015  . Claustrophobia 02/20/2015  . Transaminitis 02/18/2015  . Chest pain 02/18/2015  . Abdominal pain   . Dizziness   . Hypotension 01/04/2015  . Bipolar disorder ([place]) 06/29/2012   Starr Lake PT, DPT, LAT, ATC  09/15/17  3:58 PM      [facility] [address] [place], [place], [address] Phone: [phone]   Fax:  [phone]  Name: Satara Virella MRN: [id number] Date of Birth: 1966-08-13

## 2017-09-22 ENCOUNTER — Other Ambulatory Visit: Payer: Self-pay | Admitting: Family Medicine

## 2017-09-22 DIAGNOSIS — Z1231 Encounter for screening mammogram for malignant neoplasm of breast: Secondary | ICD-10-CM

## 2017-09-25 ENCOUNTER — Encounter: Payer: Self-pay | Admitting: Physical Therapy

## 2017-09-25 ENCOUNTER — Ambulatory Visit: Payer: Medicaid Other | Attending: Family Medicine | Admitting: Physical Therapy

## 2017-09-25 DIAGNOSIS — M6281 Muscle weakness (generalized): Secondary | ICD-10-CM | POA: Insufficient documentation

## 2017-09-25 DIAGNOSIS — G8929 Other chronic pain: Secondary | ICD-10-CM | POA: Insufficient documentation

## 2017-09-25 DIAGNOSIS — M545 Low back pain, unspecified: Secondary | ICD-10-CM

## 2017-09-25 DIAGNOSIS — R2689 Other abnormalities of gait and mobility: Secondary | ICD-10-CM | POA: Insufficient documentation

## 2017-09-25 NOTE — Therapy (Signed)
[place], [place], [address] Phone: [phone]   Fax:  [phone]  Physical Therapy Treatment  Patient Details  Name: Jordan [name] MRN: [id number] Date of Birth: 10-16-1966 Referring Provider: Elenore Paddy NP   Encounter Date: 09/25/2017  PT End of Session - 09/25/17 1540    Visit Number  3    Number of Visits  12    Date for PT Re-Evaluation  10/01/17    Authorization Type  MCD: Re-assess / Re-submit to Medicaid after 4th visit.     PT Start Time  1501    PT Stop Time  1544    PT Time Calculation (min)  43 min    Activity Tolerance  Patient tolerated treatment well    Behavior During Therapy  WFL for tasks assessed/performed       Past Medical History:  Diagnosis Date  . Alcohol abuse   . Anemia    patient denies  . Bipolar 1 disorder (HCC)    No medications currently  . CAP (community acquired pneumonia) 03/17/2015  . 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 Mental disorder   . Obesity   . PICC line infection 05/17/2015  . Sepsis due to Gram negative bacteria (MDR E Coli) 02/18/2015  . UTI (lower urinary tract infection)   . Vaginal Pap smear, abnormal     Past Surgical History:  Procedure Laterality Date  . CERVICAL CONIZATION W/BX N/A 07/01/2016   Procedure: CONIZATION CERVIX WITH BIOPSY;  Surgeon: Chancy Milroy, MD;  Location: [facility];  Service: Gynecology;  Laterality: N/A;  . CHOLECYSTECTOMY    . HYSTEROSCOPY W/D&C N/A 01/25/2016   Procedure: DILATATION AND CURETTAGE /HYSTEROSCOPY;  Surgeon: Mora Bellman, MD;  Location: [facility];  Service: Gynecology;  Laterality: N/A;  . VAGINAL HYSTERECTOMY N/A 03/11/2017   Procedure: HYSTERECTOMY VAGINAL WITH MORCELLATION;  Surgeon: Chancy Milroy, MD;  Location: [facility];  Service: Gynecology;  Laterality: N/A;    There were no vitals filed for this visit.  Subjective Assessment - 09/25/17 1507    Subjective  "my MD told  me my back isn't healed but they also let me go. I am still having pain at 2/10 today"     Currently in Pain?  Yes    Pain Score  2     Pain Location  Back    Pain Orientation  Right;Left;Lower    Pain Type  Chronic pain    Pain Onset  More than a month ago    Aggravating Factors   prolonged standing/ walking    Pain Relieving Factors  medication                      [facility] Adult PT Treatment/Exercise - 09/25/17 1516      Lumbar Exercises: Seated   Sit to Stand  10 reps x 2 sets using hands from thighs      Knee/Hip Exercises: Standing   Gait Training  walking 6 x in // with verbal cues for proper heel strike and toe off pattern., Latteral stepping 6 x in //  cues for proper heel strike or keeping toes pointed forward    Other Standing Knee Exercises  standing marching in // using green theraband 2 x 10  using green band for tactile feed back for proper height      Knee/Hip Exercises: Seated   Long Arc Quad  Strengthening;Both;2 sets;15 reps with green theraband  Hamstring Curl  2 sets;10 reps;Both;Strengthening          Balance Exercises - 09/25/17 1528      Balance Exercises: Standing   Standing Eyes Opened  Narrow base of support (BOS);Head turns;30 secs;2 reps    Standing Eyes Closed  2 reps;30 secs;Wide (BOA)    Tandem Stance  2 reps;30 secs;Eyes open with head turns          PT Short Term Goals - 09/03/17 1931      PT SHORT TERM GOAL #1   Title  pt to be I with initial HEP    Baseline  no previous HEP    Time  3    Period  Weeks    Status  New    Target Date  09/24/17      PT SHORT TERM GOAL #2   Title  pt to verbalize and demo proper posture with sitting/ standing and lifting activities to prevent and reduce low back pain     Baseline  no knowledge of proper posture or liflting mechanics.     Time  3    Period  Weeks    Status  New    Target Date  09/24/17      PT SHORT TERM GOAL #3   Title  pt to be able to stand and walk >/= 10 min  LRAD with </= 6/10 pain to promote functional mobility therapeutic progression    Baseline  can walk 30 min with RW with 8-10/10 pain    Time  3    Period  Weeks    Status  New    Target Date  09/24/17        PT Long Term Goals - 09/03/17 1934      PT LONG TERM GOAL #1   Title  pt to increase overall trunk mobility by >/= 10 degrees in all planes to promote functional mobility required for ADLs    Baseline  flexion 50 degress, extension 10 degreees, and bil side bending 8 degrees    Time  6    Period  Weeks    Status  New    Target Date  10/15/17      PT LONG TERM GOAL #2   Title  increase bil LE strength to >/= 4/5 to promote hip stability and maximize safety with walking/ standing activities     Baseline  hip extension 3+/5, adduction/ abduction 3+/5 bil, R hp flexion 4-/5, L hip flexion 4-/5    Time  6    Period  Weeks    Status  New    Target Date  10/15/17      PT LONG TERM GOAL #3   Title  pt amb with LRAD for </= 30 min and be able to navigate >/= 6 steps with </= 3/10 pain to promote endurance and functional mobility required for ADLs    Baseline  walks / stands 30 with RW with 8-10/10 pan    Time  6    Period  Weeks    Status  New    Target Date  10/15/17      PT LONG TERM GOAL #4   Title  pt to be I with all HEP given as of last visit     Baseline  no previous HEP    Time  6    Period  Weeks    Status  New    Target Date  10/15/17  Plan - 09/25/17 1642    Clinical Impression Statement  pt reports she is only having about 2/10 pain prior to staring PT. continued working on hip strength and core strength promoting standing endurance. she was able to perform gait training in // requiring minimal verbal cues and demonstration for proper form.     PT Treatment/Interventions  ADLs/Self Care Home Management;Cryotherapy;Electrical Stimulation;Iontophoresis 4mg /ml Dexamethasone;Moist Heat;Ultrasound;Therapeutic exercise;Therapeutic  activities;Patient/family education;Dry needling;Balance training;Neuromuscular re-education;Gait training;Manual techniques    PT Next Visit Plan  ERO for medicaid, core/ bil LE strengthening, pre-gait activities, in // gait training, practice maybe using RW    PT Home Exercise Plan  lower trunk rotation, seated marching with ADIM, seated clamshell, sit to stand with RW, ball squeezes    Consulted and Agree with Plan of Care  Patient       Patient will benefit from skilled therapeutic intervention in order to improve the following deficits and impairments:  Pain, Obesity, Decreased strength, Difficulty walking, Abnormal gait, Decreased endurance, Decreased balance, Decreased activity tolerance, Increased muscle spasms, Hypomobility, Postural dysfunction, Improper body mechanics  Visit Diagnosis: Chronic bilateral low back pain without sciatica  Muscle weakness (generalized)  Other abnormalities of gait and mobility     Problem List Patient Active Problem List   Diagnosis Date Noted  . Post-operative state 03/11/2017  . Painful lumpy right breast 07/27/2016  . Urinary tract infection 07/01/2015  . Diastolic dysfunction 85/46/2703  . Constipation 05/12/2015  . ESBL (extended spectrum beta-lactamase) producing bacteria infection 03/17/2015  . Diarrhea 03/03/2015  . Weakness 03/02/2015  . 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 Generalized anxiety disorder 02/20/2015  . Claustrophobia 02/20/2015  . Transaminitis 02/18/2015  . Chest pain 02/18/2015  . Abdominal pain   . Dizziness   . Hypotension 01/04/2015  . Bipolar disorder ([place]) 06/29/2012   Starr Lake PT, DPT, LAT, ATC  09/25/17  4:48 PM      [facility] [address] [place], [place], [address] Phone: [phone]   Fax:  [phone]  Name: Kayonna Lawniczak MRN: [id number] Date of Birth: 01-22-1966

## 2017-09-30 ENCOUNTER — Telehealth (HOSPITAL_COMMUNITY): Payer: Self-pay

## 2017-09-30 NOTE — Telephone Encounter (Signed)
[facility] and informed [place] that the pt would need an MRI. She only has a dg lumbar and Dr. Estanislado Pandy likes for the pt's to have MRI showing fractures. We can go ahead and schedule her consult since we are so booked up. Hopefully the MRI can get done before then. AW

## 2017-10-02 ENCOUNTER — Emergency Department (HOSPITAL_COMMUNITY)
Admission: EM | Admit: 2017-10-02 | Discharge: 2017-10-03 | Disposition: A | Discharge status: Home / Self Care | Payer: Medicaid Other | Attending: Emergency Medicine | Admitting: Emergency Medicine

## 2017-10-02 ENCOUNTER — Encounter: Payer: Self-pay | Admitting: Physical Therapy

## 2017-10-02 ENCOUNTER — Encounter (HOSPITAL_COMMUNITY): Payer: Self-pay

## 2017-10-02 ENCOUNTER — Ambulatory Visit: Payer: Medicaid Other | Admitting: Physical Therapy

## 2017-10-02 DIAGNOSIS — M6281 Muscle weakness (generalized): Secondary | ICD-10-CM

## 2017-10-02 DIAGNOSIS — M545 Low back pain, unspecified: Secondary | ICD-10-CM

## 2017-10-02 DIAGNOSIS — I5030 Unspecified diastolic (congestive) heart failure: Secondary | ICD-10-CM | POA: Diagnosis not present

## 2017-10-02 DIAGNOSIS — R197 Diarrhea, unspecified: Secondary | ICD-10-CM | POA: Diagnosis not present

## 2017-10-02 DIAGNOSIS — R2689 Other abnormalities of gait and mobility: Secondary | ICD-10-CM

## 2017-10-02 DIAGNOSIS — G8929 Other chronic pain: Secondary | ICD-10-CM

## 2017-10-02 DIAGNOSIS — Z79899 Other long term (current) drug therapy: Secondary | ICD-10-CM | POA: Insufficient documentation

## 2017-10-02 NOTE — ED Notes (Signed)
Bed: WA04 Expected date:  Expected time:  Means of arrival:  Comments: Ems 51 yo f  diarrhea

## 2017-10-02 NOTE — Therapy (Addendum)
[place], [place], [address] Phone: [phone]   Fax:  [phone]  Physical Therapy Treatment / Re-certification  Patient Details  Name: Jordan [name] MRN: [id number] Date of Birth: 09-23-1966 Referring Provider: Elenore Paddy NP   Encounter Date: 10/02/2017  PT End of Session - 10/02/17 1630    Visit Number  4    Number of Visits  12    Date for PT Re-Evaluation  11/13/17    Authorization Type  MCD: Re-assess / Re-submit to Medicaid after 4th visit.     PT Start Time  1500    PT Stop Time  1545    PT Time Calculation (min)  45 min    Activity Tolerance  Patient tolerated treatment well    Behavior During Therapy  WFL for tasks assessed/performed       Past Medical History:  Diagnosis Date  . Alcohol abuse   . Anemia    patient denies  . Bipolar 1 disorder (HCC)    No medications currently  . CAP (community acquired pneumonia) 03/17/2015  . 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 Mental disorder   . Obesity   . PICC line infection 05/17/2015  . Sepsis due to Gram negative bacteria (MDR E Coli) 02/18/2015  . UTI (lower urinary tract infection)   . Vaginal Pap smear, abnormal     Past Surgical History:  Procedure Laterality Date  . CERVICAL CONIZATION W/BX N/A 07/01/2016   Procedure: CONIZATION CERVIX WITH BIOPSY;  Surgeon: Chancy Milroy, MD;  Location: [facility];  Service: Gynecology;  Laterality: N/A;  . CHOLECYSTECTOMY    . HYSTEROSCOPY W/D&C N/A 01/25/2016   Procedure: DILATATION AND CURETTAGE /HYSTEROSCOPY;  Surgeon: Mora Bellman, MD;  Location: [facility];  Service: Gynecology;  Laterality: N/A;  . VAGINAL HYSTERECTOMY N/A 03/11/2017   Procedure: HYSTERECTOMY VAGINAL WITH MORCELLATION;  Surgeon: Chancy Milroy, MD;  Location: [facility];  Service: Gynecology;  Laterality: N/A;    There were no vitals filed for this visit.  Subjective Assessment - 10/02/17 1500    Subjective  Pt. currently reports no pain in low back and states "I'm doing ok". Pt. reports having used pain medication prior to visit.      Currently in Pain?  No/denies    Pain Score  0-No pain    Pain Location  Back    Pain Orientation  Right;Left    Pain Onset  More than a month ago       [facility] PT Assessment - 10/02/17 1501      AROM   Overall AROM Comments  18 degrees AROM Rt. hip extension; measured standing at counter for safety    AROM Assessment Site  Lumbar    Lumbar Flexion  60    Lumbar Extension  10    Lumbar - Right Side Bend  10    Lumbar - Left Side Bend  16      Strength   Right Hip Flexion  3+/5    Right Hip Extension  3+/5 tested in standing    Right Hip ABduction  4-/5    Right Hip ADduction  4-/5    Left Hip Flexion  3+/5    Left Hip Extension  3+/5 tested in standing     Left Hip ABduction  4-/5    Left Hip ADduction  4-/5    Right/Left Knee  Right;Left    Right Knee Flexion  4-/5  Right Knee Extension  4-/5    Left Knee Flexion  4/5    Left Knee Extension  4/5       [facility] Adult PT Treatment/Exercise - 10/02/17 1618      Lumbar Exercises: Seated   Other Seated Lumbar Exercises  horizontal lift/ chop; 2 sets; 10 reps; bilaterally; red theraband    Other Seated Lumbar Exercises  seated trunk rotation holding medicine ball; bilateral; 2 sets; 10 reps; 3 lbs. ball       Lumbar Exercises: Supine   Other Supine Lumbar Exercises  hips-knees 90-90, heels resting on physioball; maintaining drawing-in maneuver rolling physioball backwards then returning to start position; 10 reps; 1 set      Knee/Hip Exercises: Standing   Hip Abduction  Stengthening;Right;Left;2 sets;10 reps;Knee straight with 2.5 lbs. ankle weights, standing at chair for safety    Hip Extension  Stengthening;Right;Left;2 sets;10 reps;Knee straight with 2.5 lbs. ankle weights, standing at chair for safety      Knee/Hip Exercises: Seated   Ball Squeeze  ball squeeze in combination with  knee extension; bilateral; 2 sets; 10 reps each side     Marching  Strengthening;Both;1 set 30 seconds, combo with drawing in maneuver    Sit to Sand  2 sets;10 reps;with UE support 2nd set with red theraband around knees        PT Education - 10/02/17 1622    Education provided  Yes    Education Details  Pt. educated on updated HEP    Person(s) Educated  Patient    Methods  Explanation;Handout;Demonstration    Comprehension  Verbalized understanding;Returned demonstration       PT Short Term Goals - 10/02/17 1641      PT SHORT TERM GOAL #1   Title  pt to be I with initial HEP    Baseline  no previous HEP    Time  3    Period  Weeks    Status  Achieved      PT SHORT TERM GOAL #2   Title  pt to verbalize and demo proper posture with sitting/ standing and lifting activities to prevent and reduce low back pain     Baseline  pt. currnetly reports no pain, continues to require cues for proper posture     Time  3    Period  Weeks    Status  On-going      PT SHORT TERM GOAL #3   Title  pt to be able to stand and walk >/= 10 min LRAD with </= 6/10 pain to promote functional mobility therapeutic progression    Baseline  pt. able to complete therapuetic exercise in standing with frequent rest breaks    Time  3    Period  Weeks    Status  On-going       PT Long Term Goals - 10/02/17 1640      PT LONG TERM GOAL #1   Title  pt to increase overall trunk mobility by >/= 10 degrees in all planes to promote functional mobility required for ADLs    Baseline  flexion +10 degrees, extension same, bilateral SB + 2-8 degrees    Time  6    Period  Weeks    Status  On-going      PT LONG TERM GOAL #2   Title  increase bil LE strength to >/= 4/5 to promote hip stability and maximize safety with walking/ standing activities     Baseline  strength improved  to 4-/5 or remained same     Time  6    Period  Weeks    Status  On-going      PT LONG TERM GOAL #3   Title  pt amb with LRAD for </=  30 min and be able to navigate >/= 6 steps with </= 3/10 pain to promote endurance and functional mobility required for ADLs    Baseline  pt. able to completes therapeutic exercise in standing but requires frequent sitting rest breaks     Time  6    Period  Weeks    Status  On-going      PT LONG TERM GOAL #4   Title  pt to be I with all HEP given as of last visit     Baseline  pt. independent with intial HEP, HEP continues to progress    Time  6    Period  Weeks    Status  On-going        Plan - 10/02/17 1633    Clinical Impression Statement  Pt. reports to session today with no report of pain in low back. Lumbar ROM has progressed and LE strength has improved. However, pt. continues to demonstrate strength deficits and decreased activity tolerance. Pt. also continues to display unsteady gait pattern and is primarily using WC for locomotion. Pt. will benefit from continued PT to continue to address defects in strength and endurance, in addition to gait training for increased functional mobility and independence.     PT Frequency  1x / week    PT Duration  3 weeks progress to 2 x a week for 4 weeks    PT Treatment/Interventions  ADLs/Self Care Home Management;Cryotherapy;Electrical Stimulation;Iontophoresis 4mg /ml Dexamethasone;Moist Heat;Ultrasound;Therapeutic exercise;Therapeutic activities;Patient/family education;Dry needling;Balance training;Neuromuscular re-education;Gait training;Manual techniques    PT Next Visit Plan  ERO for medicaid, core/ bil LE strengthening, pre-gait activities, in // gait training, practice maybe using RW    PT Home Exercise Plan  lower trunk rotation, seated marching with ADIM, seated clamshell, sit to stand with RW, ball squeezes, standing hip abduction/extension, standing heel raise     Consulted and Agree with Plan of Care  Patient       Patient will benefit from skilled therapeutic intervention in order to improve the following deficits and impairments:   Pain, Obesity, Decreased strength, Difficulty walking, Abnormal gait, Decreased endurance, Decreased balance, Decreased activity tolerance, Increased muscle spasms, Hypomobility, Postural dysfunction, Improper body mechanics  Visit Diagnosis: Chronic bilateral low back pain without sciatica  Muscle weakness (generalized)  Other abnormalities of gait and mobility     Problem List Patient Active Problem List   Diagnosis Date Noted  . Post-operative state 03/11/2017  . Painful lumpy right breast 07/27/2016  . Urinary tract infection 07/01/2015  . Diastolic dysfunction 17/79/3903  . Constipation 05/12/2015  . ESBL (extended spectrum beta-lactamase) producing bacteria infection 03/17/2015  . Diarrhea 03/03/2015  . Weakness 03/02/2015  . 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 Generalized anxiety disorder 02/20/2015  . Claustrophobia 02/20/2015  . Transaminitis 02/18/2015  . Chest pain 02/18/2015  . Abdominal pain   . Dizziness   . Hypotension 01/04/2015  . Bipolar disorder ([place]) 06/29/2012    Eulis Manly, SPT 10/02/17 4:47 PM   [facility] [address] [place], [place], [address] Phone: [phone]   Fax:  [phone]  Name: Jordan [name] MRN: [id number] Date of Birth: Dec 04, 1965

## 2017-10-02 NOTE — ED Triage Notes (Signed)
Patient called EMS for complaints of diarrhea that started this evening around 17:00 after eating crab cakes.

## 2017-10-03 ENCOUNTER — Telehealth (HOSPITAL_COMMUNITY): Payer: Self-pay

## 2017-10-03 NOTE — ED Provider Notes (Signed)
[facility] DEPT Provider Note   CSN: [id number] Arrival date & time: 10/02/17  2158     History   Chief Complaint Chief Complaint  Patient presents with  . Diarrhea    HPI Jordan [name] is a 51 y.o. female.  The history is provided by the patient and medical records.  Diarrhea      51 year old female with history of alcohol abuse anemia, bipolar disorder, mental disorder, UTI, presenting to the ED with diarrhea.  Patient reports a few weeks ago she was started on antibiotics for UTI and developed diarrhea.  States she saw her primary care doctor who did a stool test and told her everything was normal.  States bowel movements got better and she was restarted on antibiotics again for another UTI.  States she has been taking them for 3 days now but has not had any diarrhea until tonight after eating crab cakes.  States her boyfriend bought them from a bargain meet been at the grocery store and they have been in the freezer for several weeks.  States that he took them tonight and they tested normal, however about 2 hours after eating them she developed some loose, watery diarrhea.  There is no melena or hematochezia.  No nausea or vomiting.  No fever or chills.  Patient took some Imodium at home but did not feel it was helping, however since arriving in the ED states she is now feeling a lot better.  Past Medical History:  Diagnosis Date  . Alcohol abuse   . Anemia    patient denies  . Bipolar 1 disorder (HCC)    No medications currently  . CAP (community acquired pneumonia) 03/17/2015  . 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 Mental disorder   . Obesity   . PICC line infection 05/17/2015  . Sepsis due to Gram negative bacteria (MDR E Coli) 02/18/2015  . UTI (lower urinary tract infection)   . Vaginal Pap smear, abnormal     Patient Active Problem List   Diagnosis Date Noted  . Post-operative state 03/11/2017  . Painful lumpy  right breast 07/27/2016  . Urinary tract infection 07/01/2015  . Diastolic dysfunction 26/37/8588  . Constipation 05/12/2015  . ESBL (extended spectrum beta-lactamase) producing bacteria infection 03/17/2015  . Diarrhea 03/03/2015  . Weakness 03/02/2015  . 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 Generalized anxiety disorder 02/20/2015  . Claustrophobia 02/20/2015  . Transaminitis 02/18/2015  . Chest pain 02/18/2015  . Abdominal pain   . Dizziness   . Hypotension 01/04/2015  . Bipolar disorder ([place]) 06/29/2012    Past Surgical History:  Procedure Laterality Date  . CERVICAL CONIZATION W/BX N/A 07/01/2016   Procedure: CONIZATION CERVIX WITH BIOPSY;  Surgeon: Chancy Milroy, MD;  Location: [facility];  Service: Gynecology;  Laterality: N/A;  . CHOLECYSTECTOMY    . HYSTEROSCOPY W/D&C N/A 01/25/2016   Procedure: DILATATION AND CURETTAGE /HYSTEROSCOPY;  Surgeon: Mora Bellman, MD;  Location: [facility];  Service: Gynecology;  Laterality: N/A;  . VAGINAL HYSTERECTOMY N/A 03/11/2017   Procedure: HYSTERECTOMY VAGINAL WITH MORCELLATION;  Surgeon: Chancy Milroy, MD;  Location: [facility];  Service: Gynecology;  Laterality: N/A;    OB History    Gravida Para Term Preterm AB Living   1 1 1     1    SAB TAB Ectopic Multiple Live Births           1       Home Medications  Prior to Admission medications   Medication Sig Start Date End Date Taking? Authorizing Provider  acetaminophen (TYLENOL) 500 MG tablet Take 1,000 mg by mouth every 6 (six) hours as needed for moderate pain.   Yes [provider]  levofloxacin (LEVAQUIN) 750 MG tablet Take 750 mg daily by mouth.   Yes [provider]  loperamide (IMODIUM A-D) 2 MG tablet Take 2 mg by mouth 4 (four) times daily as needed for diarrhea or loose stools.   Yes [provider]    Family History Family History  Problem Relation Age of Onset  . Alcohol abuse Father   . Cancer Father   . Breast cancer Neg Hx     Social  History Social History   Tobacco Use  . Smoking status: Never Smoker  . Smokeless tobacco: Never Used  Substance Use Topics  . Alcohol use: No  . Drug use: No     Allergies   Citalopram; Lamotrigine; Sulfa antibiotics; and Tramadol   Review of Systems Review of Systems  Gastrointestinal: Positive for diarrhea.  All other systems reviewed and are negative.    Physical Exam Updated Vital Signs BP 133/82 (BP Location: Left Arm)   Pulse 85   Temp 97.6 F (36.4 C) (Oral)   Resp 18   Ht 5\' 8"  (1.727 m)   Wt (!) 158.8 kg (350 lb)   LMP  (LMP Unknown) Comment: negative pregnancy test result 09-20-16  SpO2 99%   BMI 53.22 kg/m   Physical Exam  Constitutional: She is oriented to person, place, and time. She appears well-developed and well-nourished.  Obese, NAD  HENT:  Head: Normocephalic and atraumatic.  Mouth/Throat: Oropharynx is clear and moist.  Moist mucous membranes  Eyes: Conjunctivae and EOM are normal. Pupils are equal, round, and reactive to light.  Neck: Normal range of motion.  Cardiovascular: Normal rate, regular rhythm and normal heart sounds.  Pulmonary/Chest: Effort normal and breath sounds normal. No stridor. No respiratory distress.  Abdominal: Soft. Bowel sounds are normal. There is no tenderness. There is no rigidity, no rebound and no guarding.  Musculoskeletal: Normal range of motion.  Neurological: She is alert and oriented to person, place, and time.  Skin: Skin is warm and dry.  Psychiatric: She has a normal mood and affect.  Odd affect  Nursing note and vitals reviewed.    ED Treatments / Results  Labs (all labs ordered are listed, but only abnormal results are displayed) Labs Reviewed - No data to display  EKG  EKG Interpretation None       Radiology No results found.  Procedures Procedures (including critical care time)  Medications Ordered in ED Medications - No data to display   Initial Impression / Assessment and  Plan / ED Course  I have reviewed the triage vital signs and the nursing notes.  Pertinent labs & imaging results that were available during my care of the patient were reviewed by me and considered in my medical decision making (see chart for details).  51 year old female here with diarrhea.  Has been on antibiotics recently without issue, diarrhea started tonight after eating crab cakes.  She does admit boyfriend brought these from the discount meet been at the grocery store and they have been in the freezer for several weeks.  She is afebrile, nontoxic in appearance here.  Abdomen is soft and benign.  Mucous membranes are moist and she does not appear clinically dehydrated.  Reports initially no relief from Imodium, but now  feels like it is starting to work.  She has not had any diarrhea since arriving in the emergency department.  She has not had any nausea or vomiting.  Overall reports she feels improved.  Doubt significant food poisoning or other toxicity.  At this time, do not feel emergent lab work is needed.  Recommended that she continue her Imodium as needed, however do not exceed daily limit as this can cause a lot of constipation.  Close follow-up with PCP.  Discussed plan with patient, she acknowledged understanding and agreed with plan of care.  Return precautions given for new or worsening symptoms.  Final Clinical Impressions(s) / ED Diagnoses   Final diagnoses:  Diarrhea, unspecified type    ED Discharge Orders    None       Larene Pickett, PA-C 10/03/17 0150    Shanon Rosser, MD 10/03/17 [phone]

## 2017-10-03 NOTE — Discharge Instructions (Signed)
Continue your imodium. Follow-up with your primary care doctor. Return here for any new/acute changes.

## 2017-10-03 NOTE — Telephone Encounter (Signed)
Called Jordan [name] to inform her that Dr. Estanislado Pandy has looked at pt's recent imaging. We only have 2 dg lumbar from Sept. That show old L2 fracture. Per D, Pt would need a ct scan or bone scan to show recent fracture if MRI can't be done. AW

## 2017-10-29 ENCOUNTER — Ambulatory Visit: Payer: Medicaid Other | Attending: Family Medicine | Admitting: Physical Therapy

## 2017-10-29 ENCOUNTER — Encounter: Payer: Self-pay | Admitting: Physical Therapy

## 2017-10-29 DIAGNOSIS — M545 Low back pain, unspecified: Secondary | ICD-10-CM

## 2017-10-29 DIAGNOSIS — R2689 Other abnormalities of gait and mobility: Secondary | ICD-10-CM

## 2017-10-29 DIAGNOSIS — M6281 Muscle weakness (generalized): Secondary | ICD-10-CM

## 2017-10-29 DIAGNOSIS — G8929 Other chronic pain: Secondary | ICD-10-CM | POA: Diagnosis present

## 2017-10-29 NOTE — Therapy (Addendum)
[place], [place], [address] Phone: [phone]   Fax:  [phone]  Physical Therapy Treatment  Patient Details  Name: Jordan [name] MRN: [id number] Date of Birth: 12/26/1965 Referring Provider: Elenore Paddy NP   Encounter Date: 10/29/2017  PT End of Session - 10/29/17 1507    Visit Number  5    Number of Visits  12    Date for PT Re-Evaluation  11/13/17    Authorization Type  MCD: Re-assess / Re-submit to Medicaid after 8th visit.     PT Start Time  1501    PT Stop Time  0341    PT Time Calculation (min)  760 min    Activity Tolerance  Patient tolerated treatment well    Behavior During Therapy  WFL for tasks assessed/performed       Past Medical History:  Diagnosis Date  . Alcohol abuse   . Anemia    patient denies  . Bipolar 1 disorder (HCC)    No medications currently  . CAP (community acquired pneumonia) 03/17/2015  . 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 Mental disorder   . Obesity   . PICC line infection 05/17/2015  . Sepsis due to Gram negative bacteria (MDR E Coli) 02/18/2015  . UTI (lower urinary tract infection)   . Vaginal Pap smear, abnormal     Past Surgical History:  Procedure Laterality Date  . CERVICAL CONIZATION W/BX N/A 07/01/2016   Procedure: CONIZATION CERVIX WITH BIOPSY;  Surgeon: Chancy Milroy, MD;  Location: [facility];  Service: Gynecology;  Laterality: N/A;  . CHOLECYSTECTOMY    . HYSTEROSCOPY W/D&C N/A 01/25/2016   Procedure: DILATATION AND CURETTAGE /HYSTEROSCOPY;  Surgeon: Mora Bellman, MD;  Location: [facility];  Service: Gynecology;  Laterality: N/A;  . VAGINAL HYSTERECTOMY N/A 03/11/2017   Procedure: HYSTERECTOMY VAGINAL WITH MORCELLATION;  Surgeon: Chancy Milroy, MD;  Location: [facility];  Service: Gynecology;  Laterality: N/A;    There were no vitals filed for this visit.  Subjective Assessment - 10/29/17 1505    Subjective  "I've been  doing okay, I've a little sick. My back has been getting alittle better"     Currently in Pain?  Yes    Pain Location  Back    Pain Orientation  Right;Left    Pain Descriptors / Indicators  Aching    Pain Type  Chronic pain    Pain Onset  More than a month ago    Pain Frequency  Intermittent    Aggravating Factors   standing/ walking for long periods of time    Pain Relieving Factors  Medication, exercise                      [facility] Adult PT Treatment/Exercise - 10/29/17 1508      Lumbar Exercises: Stretches   Quadruped Mid Back Stretch Limitations  sitting walking hands out along //  2 x 30 sec hold      Lumbar Exercises: Standing   Other Standing Lumbar Exercises  pushing ball down into chair2 x 10 holding 5 sec  (verbal cues and demonstrating for proper form and to keep core tight)      Lumbar Exercises: Supine   Other Supine Lumbar Exercises  ab set 10 x 3 sec hold      Knee/Hip Exercises: Aerobic   Nustep  UE/LE L5 x 8 min      Knee/Hip Exercises: Standing  Hip Abduction  Stengthening;Right;Left;10 reps;Knee straight;2 sets 3#    Hip Extension  Stengthening;Right;Left;2 sets;10 reps;Knee straight 3#    Gait Training  walking 6 x in // with verbal cues for proper heel strike and toe off pattern., Latteral stepping 6 x in //       Knee/Hip Exercises: Seated   Long Arc Quad  4 sets;15 reps 3# bil     Marching  4 sets;15 reps 3#      Manual Therapy   Manual therapy comments  DTM along bil lumbar parapspinals          Balance Exercises - 10/29/17 1510      Balance Exercises: Standing   Standing Eyes Opened  2 reps;30 secs    Standing Eyes Closed  2 reps;30 secs    Tandem Stance  4 reps;30 secs          PT Short Term Goals - 10/02/17 1641      PT SHORT TERM GOAL #1   Title  pt to be I with initial HEP    Baseline  no previous HEP    Time  3    Period  Weeks    Status  Achieved      PT SHORT TERM GOAL #2   Title  pt to verbalize and demo  proper posture with sitting/ standing and lifting activities to prevent and reduce low back pain     Baseline  pt. currnetly reports no pain, continues to require cues for proper posture     Time  3    Period  Weeks    Status  On-going      PT SHORT TERM GOAL #3   Title  pt to be able to stand and walk >/= 10 min LRAD with </= 6/10 pain to promote functional mobility therapeutic progression    Baseline  pt. able to complete therapuetic exercise in standing with frequent rest breaks    Time  3    Period  Weeks    Status  On-going        PT Long Term Goals - 10/02/17 1640      PT LONG TERM GOAL #1   Title  pt to increase overall trunk mobility by >/= 10 degrees in all planes to promote functional mobility required for ADLs    Baseline  flexion +10 degrees, extension same, bilateral SB + 2-8 degrees    Time  6    Period  Weeks    Status  On-going      PT LONG TERM GOAL #2   Title  increase bil LE strength to >/= 4/5 to promote hip stability and maximize safety with walking/ standing activities     Baseline  strength improved to 4-/5 or remained same     Time  6    Period  Weeks    Status  On-going      PT LONG TERM GOAL #3   Title  pt amb with LRAD for </= 30 min and be able to navigate >/= 6 steps with </= 3/10 pain to promote endurance and functional mobility required for ADLs    Baseline  pt. able to completes therapeutic exercise in standing but requires frequent sitting rest breaks     Time  6    Period  Weeks    Status  On-going      PT LONG TERM GOAL #4   Title  pt to be I with all HEP given as  of last visit     Baseline  pt. independent with intial HEP, HEP continues to progress    Time  6    Period  Weeks    Status  On-going            Plan - 10/29/17 1547    Clinical Impression Statement  pt reported she is still having pain and stated she had "good" pain throughout session. Focused session on core and hip strengthening as well as walking in // to promote  proper form / efficency. Post session she reported decreased tightness in the low back.. Pt requires multiple verbal cues throughout session to slow down on exercise and for proper form.     PT Next Visit Plan  Core/ bil LE strengthening, pre-gait activities, in // gait training, practice bariatric RW    PT Home Exercise Plan  lower trunk rotation, seated marching with ADIM, seated clamshell, sit to stand with RW, ball squeezes, standing hip abduction/extension, standing heel raise     Consulted and Agree with Plan of Care  Patient       Patient will benefit from skilled therapeutic intervention in order to improve the following deficits and impairments:  Pain, Obesity, Decreased strength, Difficulty walking, Abnormal gait, Decreased endurance, Decreased balance, Decreased activity tolerance, Increased muscle spasms, Hypomobility, Postural dysfunction, Improper body mechanics  Visit Diagnosis: Chronic bilateral low back pain without sciatica  Muscle weakness (generalized)  Other abnormalities of gait and mobility     Problem List Patient Active Problem List   Diagnosis Date Noted  . Post-operative state 03/11/2017  . Painful lumpy right breast 07/27/2016  . Urinary tract infection 07/01/2015  . Diastolic dysfunction 66/44/0347  . Constipation 05/12/2015  . ESBL (extended spectrum beta-lactamase) producing bacteria infection 03/17/2015  . Diarrhea 03/03/2015  . Weakness 03/02/2015  . 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 Generalized anxiety disorder 02/20/2015  . Claustrophobia 02/20/2015  . Transaminitis 02/18/2015  . Chest pain 02/18/2015  . Abdominal pain   . Dizziness   . Hypotension 01/04/2015  . Bipolar disorder ([place]) 06/29/2012   Starr Lake PT, DPT, LAT, ATC  10/29/17  3:50 PM      [facility] [address] [place], [place], [address] Phone: [phone]   Fax:  [phone]  Name: Jordan [name] MRN:  [id number] Date of Birth: [birth year]

## 2017-11-05 ENCOUNTER — Encounter: Payer: Self-pay | Admitting: Physical Therapy

## 2017-11-05 ENCOUNTER — Ambulatory Visit: Payer: Medicaid Other | Admitting: Physical Therapy

## 2017-11-05 DIAGNOSIS — M545 Low back pain, unspecified: Secondary | ICD-10-CM

## 2017-11-05 DIAGNOSIS — R2689 Other abnormalities of gait and mobility: Secondary | ICD-10-CM

## 2017-11-05 DIAGNOSIS — M6281 Muscle weakness (generalized): Secondary | ICD-10-CM

## 2017-11-05 DIAGNOSIS — G8929 Other chronic pain: Secondary | ICD-10-CM

## 2017-11-05 NOTE — Patient Instructions (Signed)

## 2017-11-05 NOTE — Therapy (Signed)
[place], [place], [address] Phone: [phone]   Fax:  [phone]  Physical Therapy Treatment  Patient Details  Name: Jordan [name] MRN: [id number] Date of Birth: 1966-04-30 Referring Provider: Elenore Paddy NP   Encounter Date: 11/05/2017  PT End of Session - 11/05/17 1547    Visit Number  6    Number of Visits  12    Date for PT Re-Evaluation  11/13/17    Authorization Type  MCD: Re-assess / Re-submit to Medicaid after 8th visit.     PT Start Time  1500    PT Stop Time  1546    PT Time Calculation (min)  46 min    Activity Tolerance  Patient tolerated treatment well;Patient limited by pain    Behavior During Therapy  [facility] for tasks assessed/performed       Past Medical History:  Diagnosis Date  . Alcohol abuse   . Anemia    patient denies  . Bipolar 1 disorder (HCC)    No medications currently  . CAP (community acquired pneumonia) 03/17/2015  . 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 Mental disorder   . Obesity   . PICC line infection 05/17/2015  . Sepsis due to Gram negative bacteria (MDR E Coli) 02/18/2015  . UTI (lower urinary tract infection)   . Vaginal Pap smear, abnormal     Past Surgical History:  Procedure Laterality Date  . CERVICAL CONIZATION W/BX N/A 07/01/2016   Procedure: CONIZATION CERVIX WITH BIOPSY;  Surgeon: Chancy Milroy, MD;  Location: [facility];  Service: Gynecology;  Laterality: N/A;  . CHOLECYSTECTOMY    . HYSTEROSCOPY W/D&C N/A 01/25/2016   Procedure: DILATATION AND CURETTAGE /HYSTEROSCOPY;  Surgeon: Mora Bellman, MD;  Location: [facility];  Service: Gynecology;  Laterality: N/A;  . VAGINAL HYSTERECTOMY N/A 03/11/2017   Procedure: HYSTERECTOMY VAGINAL WITH MORCELLATION;  Surgeon: Chancy Milroy, MD;  Location: [facility];  Service: Gynecology;  Laterality: N/A;    There were no vitals filed for this visit.  Subjective Assessment - 11/05/17 1459    Subjective  "Ive been feeling very sore today, and it seems to fluctuate depending on activity"    Currently in Pain?  Yes    Pain Score  9     Pain Orientation  Right;Left    Pain Type  Chronic pain    Pain Onset  More than a month ago    Pain Frequency  Intermittent    Aggravating Factors   standing/ walking for long periods of time    Pain Relieving Factors  medication, exercise                      [facility] Adult PT Treatment/Exercise - 11/05/17 1505      Self-Care   Self-Care  Posture;Other Self-Care Comments    Posture  posture education and proper lifting mechanics     Other Self-Care Comments   log rolling techinque and how to perform which she required mulitple verbal cues and demonstration.       Lumbar Exercises: Stretches   Quadruped Mid Back Stretch Limitations  sitting walking hands out along //  2 x 30 sec hold      Lumbar Exercises: Aerobic   Stationary Bike  Nu-Step L6 x 8 min UE/LE      Knee/Hip Exercises: Supine   Other Supine Knee/Hip Exercises  pelvic tilt 2 x 10 in decompression position  Modalities   Modalities  Moist Heat      Moist Heat Therapy   Number Minutes Moist Heat  10 Minutes    Moist Heat Location  Lumbar Spine in decompression position. concurrent to self care posture      Manual Therapy   Manual Therapy  --    Manual therapy comments  DTM along bil lumbar parapspinals using tennis balls             PT Education - 11/05/17 1547    Education provided  Yes    Education Details  posture education and log rolling techniques    Person(s) Educated  Patient    Methods  Explanation;Verbal cues    Comprehension  Verbalized understanding;Verbal cues required       PT Short Term Goals - 10/02/17 1641      PT SHORT TERM GOAL #1   Title  pt to be I with initial HEP    Baseline  no previous HEP    Time  3    Period  Weeks    Status  Achieved      PT SHORT TERM GOAL #2   Title  pt to verbalize and demo proper posture  with sitting/ standing and lifting activities to prevent and reduce low back pain     Baseline  pt. currnetly reports no pain, continues to require cues for proper posture     Time  3    Period  Weeks    Status  On-going      PT SHORT TERM GOAL #3   Title  pt to be able to stand and walk >/= 10 min LRAD with </= 6/10 pain to promote functional mobility therapeutic progression    Baseline  pt. able to complete therapuetic exercise in standing with frequent rest breaks    Time  3    Period  Weeks    Status  On-going        PT Long Term Goals - 10/02/17 1640      PT LONG TERM GOAL #1   Title  pt to increase overall trunk mobility by >/= 10 degrees in all planes to promote functional mobility required for ADLs    Baseline  flexion +10 degrees, extension same, bilateral SB + 2-8 degrees    Time  6    Period  Weeks    Status  On-going      PT LONG TERM GOAL #2   Title  increase bil LE strength to >/= 4/5 to promote hip stability and maximize safety with walking/ standing activities     Baseline  strength improved to 4-/5 or remained same     Time  6    Period  Weeks    Status  On-going      PT LONG TERM GOAL #3   Title  pt amb with LRAD for </= 30 min and be able to navigate >/= 6 steps with </= 3/10 pain to promote endurance and functional mobility required for ADLs    Baseline  pt. able to completes therapeutic exercise in standing but requires frequent sitting rest breaks     Time  6    Period  Weeks    Status  On-going      PT LONG TERM GOAL #4   Title  pt to be I with all HEP given as of last visit     Baseline  pt. independent with intial HEP, HEP continues to progress  Time  6    Period  Weeks    Status  On-going            Plan - 11/05/17 1547    Clinical Impression Statement  pt reports today is a bad day and is having 9/10 pain today. utlized manual techniques to reduce lumbar paraspinals spasm, spent time educating how to maintain proper form to prevent  pain while pt layined in decompression postion on MHP. post session she reported pain dropped to 8/10. discussed with pt that if no progress is being made on reassessment in the next 1-2 visits we may need to refer back to her Md for further assessment.     PT Next Visit Plan  Core/ bil LE strengthening, pre-gait activities, in // gait training, practice bariatric RW    PT Home Exercise Plan  lower trunk rotation, seated marching with ADIM, seated clamshell, sit to stand with RW, ball squeezes, standing hip abduction/extension, standing heel raise     Consulted and Agree with Plan of Care  Patient       Patient will benefit from skilled therapeutic intervention in order to improve the following deficits and impairments:  Pain, Obesity, Decreased strength, Difficulty walking, Abnormal gait, Decreased endurance, Decreased balance, Decreased activity tolerance, Increased muscle spasms, Hypomobility, Postural dysfunction, Improper body mechanics  Visit Diagnosis: Chronic bilateral low back pain without sciatica  Muscle weakness (generalized)  Other abnormalities of gait and mobility     Problem List Patient Active Problem List   Diagnosis Date Noted  . Post-operative state 03/11/2017  . Painful lumpy right breast 07/27/2016  . Urinary tract infection 07/01/2015  . Diastolic dysfunction 84/53/6468  . Constipation 05/12/2015  . ESBL (extended spectrum beta-lactamase) producing bacteria infection 03/17/2015  . Diarrhea 03/03/2015  . Weakness 03/02/2015  . 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 Generalized anxiety disorder 02/20/2015  . Claustrophobia 02/20/2015  . Transaminitis 02/18/2015  . Chest pain 02/18/2015  . Abdominal pain   . Dizziness   . Hypotension 01/04/2015  . Bipolar disorder ([place]) 06/29/2012   Starr Lake PT, DPT, LAT, ATC  11/05/17  3:51 PM      [facility] [address] [place], [place], [address] Phone:  [phone]   Fax:  [phone]  Name: Jordan [name] MRN: [id number] Date of Birth: 05/02/1966

## 2017-11-12 ENCOUNTER — Encounter: Payer: Self-pay | Admitting: Physical Therapy

## 2017-11-12 ENCOUNTER — Ambulatory Visit: Payer: Medicaid Other | Admitting: Physical Therapy

## 2017-11-12 DIAGNOSIS — M6281 Muscle weakness (generalized): Secondary | ICD-10-CM

## 2017-11-12 DIAGNOSIS — M545 Low back pain, unspecified: Secondary | ICD-10-CM

## 2017-11-12 DIAGNOSIS — G8929 Other chronic pain: Secondary | ICD-10-CM

## 2017-11-12 DIAGNOSIS — R2689 Other abnormalities of gait and mobility: Secondary | ICD-10-CM

## 2017-11-12 NOTE — Therapy (Signed)
[place], [place], [address] Phone: [phone]   Fax:  [phone]  Physical Therapy Treatment / Re-certification   Patient Details  Name: Jordan [name] MRN: [id number] Date of Birth: January 22, 1966 Referring Provider: Elenore Paddy NP   Encounter Date: 11/12/2017  PT End of Session - 11/12/17 1544    Visit Number  7    Number of Visits  12    Date for PT Re-Evaluation  12/10/17    Authorization Type  MCD: Re-assess / Re-submit to Medicaid after 8th visit.     PT Start Time  1450    PT Stop Time  1540    PT Time Calculation (min)  50 min    Activity Tolerance  Patient tolerated treatment well    Behavior During Therapy  WFL for tasks assessed/performed       Past Medical History:  Diagnosis Date  . Alcohol abuse   . Anemia    patient denies  . Bipolar 1 disorder (HCC)    No medications currently  . CAP (community acquired pneumonia) 03/17/2015  . 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 Mental disorder   . Obesity   . PICC line infection 05/17/2015  . Sepsis due to Gram negative bacteria (MDR E Coli) 02/18/2015  . UTI (lower urinary tract infection)   . Vaginal Pap smear, abnormal     Past Surgical History:  Procedure Laterality Date  . CERVICAL CONIZATION W/BX N/A 07/01/2016   Procedure: CONIZATION CERVIX WITH BIOPSY;  Surgeon: Chancy Milroy, MD;  Location: [facility];  Service: Gynecology;  Laterality: N/A;  . CHOLECYSTECTOMY    . HYSTEROSCOPY W/D&C N/A 01/25/2016   Procedure: DILATATION AND CURETTAGE /HYSTEROSCOPY;  Surgeon: Mora Bellman, MD;  Location: [facility];  Service: Gynecology;  Laterality: N/A;  . VAGINAL HYSTERECTOMY N/A 03/11/2017   Procedure: HYSTERECTOMY VAGINAL WITH MORCELLATION;  Surgeon: Chancy Milroy, MD;  Location: [facility];  Service: Gynecology;  Laterality: N/A;    There were no vitals filed for this visit.  Subjective Assessment - 11/12/17 1453     Subjective  "I've been trying to sue the wall more to help with standing while I am in the shower , my back has been doing alittle better since the last session. she reports she has to take medication to calm the  pain down but overall the pain seems to stay the same"    Patient Stated Goals  I want to walk again, be able to get into a regular car again,     Currently in Pain?  Yes    Pain Score  0-No pain last took pain medication twice today with the most recent being 1Pm    Pain Orientation  Right;Left    Pain Onset  More than a month ago    Pain Frequency  Intermittent    Aggravating Factors   sleeping at night, moving around, standing and walking    Pain Relieving Factors  medication, hot showers          [facility] PT Assessment - 11/12/17 1509      AROM   Lumbar Flexion  50    Lumbar Extension  10    Lumbar - Right Side Bend  8    Lumbar - Left Side Bend  14      Strength   Right Hip Flexion  4-/5    Right Hip Extension  3+/5    Right Hip ABduction  4-/5    Right Hip ADduction  4-/5    Left Hip Flexion  4-/5    Left Hip Extension  3+/5    Left Hip ABduction  4-/5    Left Hip ADduction  4-/5    Right Knee Flexion  4-/5    Right Knee Extension  4-/5    Left Knee Flexion  4/5    Left Knee Extension  4/5      Palpation   Palpation comment  TTP at bil L1-L5 paraspinals                  [facility] Adult PT Treatment/Exercise - 11/12/17 0001      Knee/Hip Exercises: Seated   Long Arc Quad  4 sets;15 reps 4#    Marching  4 sets;15 reps 4#    Sit to [employer]  1 set;10 reps pt fatigues quickly with exercise             PT Education - 11/12/17 1543    Education provided  Yes    Education Details  reviewed previously provided HEP and benefits of continued exercsie. Discussed at length pt progress with Pt in regard to redcution of mobility compared to last measurements and expectations regarding treatment.     Person(s) Educated  Patient    Methods  Explanation;Verbal  cues;Demonstration    Comprehension  Verbalized understanding;Verbal cues required;Returned demonstration;Tactile cues required       PT Short Term Goals - 11/12/17 1519      PT SHORT TERM GOAL #1   Title  pt to be I with initial HEP    Time  3    Period  Weeks    Status  Achieved      PT SHORT TERM GOAL #2   Title  pt to verbalize and demo proper posture with sitting/ standing and lifting activities to prevent and reduce low back pain     Baseline  pt. currnetly reports no pain, continues to require cues for proper posture  throughout multiple positions.     Time  3    Period  Weeks    Status  Partially Met      PT SHORT TERM GOAL #3   Title  pt to be able to stand and walk >/= 10 min LRAD with </= 6/10 pain to promote functional mobility therapeutic progression    Time  3    Period  Weeks    Status  Not Met        PT Long Term Goals - 11/12/17 1521      PT LONG TERM GOAL #1   Title  pt to increase overall trunk mobility by >/= 10 degrees in all planes to promote functional mobility required for ADLs    Baseline  flexion +10 degrees, extension same, bilateral SB + 2-8 degrees    Time  6    Period  Weeks    Status  Not Met      PT LONG TERM GOAL #2   Title  increase bil LE strength to >/= 4/5 to promote hip stability and maximize safety with walking/ standing activities     Baseline  strength improved to 4-/5 or remained same     Time  6    Period  Weeks    Status  Not Met      PT LONG TERM GOAL #3   Title  pt amb with LRAD for </= 30 min and be able to navigate >/=  6 steps with </= 3/10 pain to promote endurance and functional mobility required for ADLs    Baseline  pt. able to completes therapeutic exercise in standing but requires frequent sitting rest breaks     Time  6    Period  Weeks    Status  Not Met      PT LONG TERM GOAL #4   Title  pt to be I with all HEP given as of last visit     Baseline  pt. independent with intial HEP, HEP continues to progress     Time  6    Period  Weeks    Status  Not Met            Plan - 11/12/17 1544    Clinical Impression Statement  pt reports today no pain which she reports is still controlled with medication, ROM assessment has regressed since previouls measures back to her inital evaluation findings except for L side bending which improved from 8 to 14 degrees since evaluation. She demonstrates some improvement in LE strength since evaluation from 3+/5 to 4-/5 in bil hip abductors / flexors but conitnues to demonstrate limited strength at 3+/ 5 in bil hip extensors. She has made minimal progress with goals due to continued pain and low back pain, pt is motivated to continue with PT reporting she feels like it is getting better and that she has been consistent with her HEP. She would benefit from continued treatment for 1 x a week for 4 weeks to continue working on reducing low back pain, improve bil LE strength,  increase walking/ standing tolerance and balance to promote safety and reduce falls.   if no progress is made at that time plan to discharged her from PT and refer back to her provided further assessment.     Rehab Potential  Good    PT Frequency  1x / week    PT Duration  4 weeks    PT Treatment/Interventions  ADLs/Self Care Home Management;Cryotherapy;Electrical Stimulation;Iontophoresis 8m/ml Dexamethasone;Moist Heat;Ultrasound;Therapeutic exercise;Therapeutic activities;Patient/family education;Dry needling;Balance training;Neuromuscular re-education;Gait training;Manual techniques    PT Next Visit Plan  Core/ bil LE strengthening, pre-gait activities, in // gait training, practice bariatric RW,    PT Home Exercise Plan  lower trunk rotation, seated marching with ADIM, seated clamshell, sit to stand with RW, ball squeezes, standing hip abduction/extension, standing heel raise     Consulted and Agree with Plan of Care  Patient       Patient will benefit from skilled therapeutic intervention in  order to improve the following deficits and impairments:  Pain, Obesity, Decreased strength, Difficulty walking, Abnormal gait, Decreased endurance, Decreased balance, Decreased activity tolerance, Increased muscle spasms, Hypomobility, Postural dysfunction, Improper body mechanics  Visit Diagnosis: Chronic bilateral low back pain without sciatica  Muscle weakness (generalized)  Other abnormalities of gait and mobility     Problem List Patient Active Problem List   Diagnosis Date Noted  . Post-operative state 03/11/2017  . Painful lumpy right breast 07/27/2016  . Urinary tract infection 07/01/2015  . Diastolic dysfunction 076/73/4193 . Constipation 05/12/2015  . ESBL (extended spectrum beta-lactamase) producing bacteria infection 03/17/2015  . Diarrhea 03/03/2015  . Weakness 03/02/2015  . 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 Generalized anxiety disorder 02/20/2015  . Claustrophobia 02/20/2015  . Transaminitis 02/18/2015  . Chest pain 02/18/2015  . Abdominal pain   . Dizziness   . Hypotension 01/04/2015  . Bipolar disorder ([place] 06/29/2012   KStarr LakePT, DPT,  LAT, ATC  11/12/17  3:53 PM      [facility] [address] [place], [place], [address] Phone: [phone]   Fax:  [phone]  Name: Jordan [name] MRN: [id number] Date of Birth: December 03, 1965

## 2017-12-03 ENCOUNTER — Ambulatory Visit: Payer: Medicaid Other | Admitting: Physical Therapy

## 2017-12-10 ENCOUNTER — Encounter: Payer: Self-pay | Admitting: Physical Therapy

## 2017-12-10 ENCOUNTER — Ambulatory Visit: Payer: Medicaid Other | Attending: Family Medicine | Admitting: Physical Therapy

## 2017-12-10 DIAGNOSIS — G8929 Other chronic pain: Secondary | ICD-10-CM | POA: Insufficient documentation

## 2017-12-10 DIAGNOSIS — M6281 Muscle weakness (generalized): Secondary | ICD-10-CM | POA: Diagnosis not present

## 2017-12-10 DIAGNOSIS — M545 Low back pain, unspecified: Secondary | ICD-10-CM

## 2017-12-10 DIAGNOSIS — R2689 Other abnormalities of gait and mobility: Secondary | ICD-10-CM | POA: Diagnosis present

## 2017-12-10 NOTE — Therapy (Addendum)
[place], [place], [address] Phone: [phone]   Fax:  [phone]  Physical Therapy Treatment / Discharge summary  Patient Details  Name: Jordan [name] MRN: [id number] Date of Birth: May 21, 1966 Referring Provider: Elenore Paddy NP   Encounter Date: 12/10/2017  PT End of Session - 12/10/17 1509    Visit Number  8    Number of Visits  12    Date for PT Re-Evaluation  01/07/18    PT Start Time  1501    PT Stop Time  1545    PT Time Calculation (min)  44 min    Activity Tolerance  Patient tolerated treatment well    Behavior During Therapy  [facility] for tasks assessed/performed       Past Medical History:  Diagnosis Date  . Alcohol abuse   . Anemia    patient denies  . Bipolar 1 disorder (HCC)    No medications currently  . CAP (community acquired pneumonia) 03/17/2015  . 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 Mental disorder   . Obesity   . PICC line infection 05/17/2015  . Sepsis due to Gram negative bacteria (MDR E Coli) 02/18/2015  . UTI (lower urinary tract infection)   . Vaginal Pap smear, abnormal     Past Surgical History:  Procedure Laterality Date  . CERVICAL CONIZATION W/BX N/A 07/01/2016   Procedure: CONIZATION CERVIX WITH BIOPSY;  Surgeon: Chancy Milroy, MD;  Location: [facility];  Service: Gynecology;  Laterality: N/A;  . CHOLECYSTECTOMY    . HYSTEROSCOPY W/D&C N/A 01/25/2016   Procedure: DILATATION AND CURETTAGE /HYSTEROSCOPY;  Surgeon: Mora Bellman, MD;  Location: [facility];  Service: Gynecology;  Laterality: N/A;  . VAGINAL HYSTERECTOMY N/A 03/11/2017   Procedure: HYSTERECTOMY VAGINAL WITH MORCELLATION;  Surgeon: Chancy Milroy, MD;  Location: [facility];  Service: Gynecology;  Laterality: N/A;    There were no vitals filed for this visit.  Subjective Assessment - 12/10/17 1506    Subjective  "I think I have been doing pretty good, I have stopped using the RW at  home and keep walking despite the pain/ soreness"    Currently in Pain?  Yes    Pain Score  1  max 4/10 in the back, last took pain meds at 9am    Pain Location  Back    Pain Orientation  Right;Left    Pain Descriptors / Indicators  Aching    Pain Type  Chronic pain    Pain Onset  More than a month ago    Pain Frequency  Intermittent    Pain Relieving Factors  medication         [facility] PT Assessment - 12/10/17 1510      AROM   Lumbar Flexion  70    Lumbar Extension  10    Lumbar - Right Side Bend  10    Lumbar - Left Side Bend  10      Strength   Right Hip Flexion  4-/5    Right Hip Extension  3+/5    Right Hip ABduction  4-/5    Right Hip ADduction  4-/5    Left Hip Flexion  4-/5    Left Hip Extension  3+/5    Left Hip ABduction  4-/5    Left Hip ADduction  4-/5    Right Knee Flexion  4-/5    Right Knee Extension  4-/5    Left Knee  Flexion  4/5    Left Knee Extension  4/5                  [facility] Adult PT Treatment/Exercise - 12/10/17 1515      Lumbar Exercises: Standing   Other Standing Lumbar Exercises  pushing ball down into chair2 x 10 holding 5 sec  (verbal cues and demonstrating for proper form and to keep core tight)      Lumbar Exercises: Seated   Other Seated Lumbar Exercises  seated pressing down with bil UE with pilates ring on RLE/LLE 2 x 10 holding 2 sec ea.       Knee/Hip Exercises: Standing   Heel Raises  2 sets;Both    Hip Extension  Stengthening;Right;Left;2 sets;10 reps;Knee straight with red theraband    Functional Squat  2 sets;10 reps in //    Gait Training  walking 6 x in // with verbal cues for proper heel strike and toe off pattern., Latteral stepping 6 x in //     Other Standing Knee Exercises  marching in // touching knees to bar for equal height.     Other Standing Knee Exercises  lateral band walks 4 x in // with red theraband      Knee/Hip Exercises: Seated   Long Arc Quad  4 sets;15 reps red theraband around the knees                PT Short Term Goals - 12/10/17 1547      PT SHORT TERM GOAL #1   Title  pt to be I with initial HEP    Time  3    Period  Weeks    Status  Achieved      PT SHORT TERM GOAL #2   Title  pt to verbalize and demo proper posture with sitting/ standing and lifting activities to prevent and reduce low back pain     Time  3    Period  Weeks    Status  Achieved      PT SHORT TERM GOAL #3   Title  pt to be able to stand and walk >/= 10 min LRAD with </= 6/10 pain to promote functional mobility therapeutic progression    Time  3    Period  Weeks    Status  Achieved        PT Long Term Goals - 12/10/17 1547      PT LONG TERM GOAL #1   Title  pt to increase overall trunk mobility by >/= 10 degrees in all planes to promote functional mobility required for ADLs    Baseline  UPDATED: flexion 70 degrees, extension 10 , R side bending/ L side bending 10 degrees,     Time  4    Period  Weeks    Status  On-going    Target Date  01/07/18      PT LONG TERM GOAL #2   Title  increase bil LE strength to >/= 4/5 to promote hip stability and maximize safety with walking/ standing activities     Baseline  UPDATED: strength to 4-/5 in bil LE    Time  4    Period  Weeks    Status  On-going    Target Date  01/07/18      PT LONG TERM GOAL #3   Title  pt amb with LRAD for </= 30 min and be able to navigate >/= 6 steps with </= 3/10 pain  to promote endurance and functional mobility required for ADLs    Baseline  UPDATED: reported able to walk without RW in home ~10 min but reports pain at  4/10    Time  4    Status  On-going    Target Date  01/07/18      PT LONG TERM GOAL #4   Title  pt to be I with all HEP given as of last visit     Baseline  UPDATED: pt. independent current HEP provided, and conitnue to progress as tolerated.     Time  4    Period  Weeks    Status  On-going    Target Date  01/07/18            Plan - 12/10/17 1547    Clinical Impression Statement   pt was only able to use 1 of the approved 4  visits inthe provided time frame of 11/19/2017 to 12/16/2017 due to transportation issues and scheduling conflicts. She has made progress increasing trunk flexion increasing to 70 degrees today. She reports she has been walking around the house without her RW and feels she is able to go longer but conintues to have pain. although she uses her WC when she is out in public. She met all STG's this visit and continues to progress with LTG improving walking with LRAD, and gradual progress with LE strengthening. She would benefit from continued physical therapy to promote gait training with RW for safety, increase hip strength for stability with walking/ standing and maximize endurnace, improve core strength to promote posture to facilitate healing inthe low back. Continued to discuss if no progress is made in that time then we will refer back to her MD.       PT Frequency  1x / week    PT Duration  4 weeks    PT Treatment/Interventions  ADLs/Self Care Home Management;Cryotherapy;Electrical Stimulation;Iontophoresis 23m/ml Dexamethasone;Moist Heat;Ultrasound;Therapeutic exercise;Therapeutic activities;Patient/family education;Dry needling;Balance training;Neuromuscular re-education;Gait training;Manual techniques    PT Next Visit Plan  Core/ bil LE strengthening, pre-gait activities, in // gait training, practice bariatric RW, did pt bring her RW to practice in clinic.     PT Home Exercise Plan  lower trunk rotation, seated marching with ADIM, seated clamshell, sit to stand with RW, ball squeezes, standing hip abduction/extension, standing heel raise , seated UE press down keeping core tight    Consulted and Agree with Plan of Care  Patient       Patient will benefit from skilled therapeutic intervention in order to improve the following deficits and impairments:  Pain, Obesity, Decreased strength, Difficulty walking, Abnormal gait, Decreased endurance, Decreased  balance, Decreased activity tolerance, Increased muscle spasms, Hypomobility, Postural dysfunction, Improper body mechanics  Visit Diagnosis: Muscle weakness (generalized)  Chronic bilateral low back pain without sciatica  Other abnormalities of gait and mobility     Problem List Patient Active Problem List   Diagnosis Date Noted  . Post-operative state 03/11/2017  . Painful lumpy right breast 07/27/2016  . Urinary tract infection 07/01/2015  . Diastolic dysfunction 047/82/9562 . Constipation 05/12/2015  . ESBL (extended spectrum beta-lactamase) producing bacteria infection 03/17/2015  . Diarrhea 03/03/2015  . Weakness 03/02/2015  . 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 Generalized anxiety disorder 02/20/2015  . Claustrophobia 02/20/2015  . Transaminitis 02/18/2015  . Chest pain 02/18/2015  . Abdominal pain   . Dizziness   . Hypotension 01/04/2015  . Bipolar disorder ([place] 06/29/2012   Jujuan Dugo PT, DPT, LAT,  ATC  12/10/17  4:00 PM      [facility] [address] [place], [place], [address] Phone: [phone]   Fax:  [phone]  Name: Breckin Zafar MRN: [id number] Date of Birth: 1966-03-03         PHYSICAL THERAPY DISCHARGE SUMMARY  Visits from Start of Care: 8  Current functional level related to goals / functional outcomes: See goals   Remaining deficits: Unknown due to pt no returning   Education / Equipment: HEP, theraband, posture,   Plan: Patient agrees to discharge.  Patient goals were partially met. Patient is being discharged due to not returning since the last visit.  ?????         Jomarion Mish PT, DPT, LAT, ATC  01/21/18  3:50 PM

## 2017-12-15 ENCOUNTER — Ambulatory Visit (HOSPITAL_COMMUNITY): Payer: Self-pay | Admitting: Psychiatry

## 2017-12-15 ENCOUNTER — Ambulatory Visit: Payer: Self-pay

## 2017-12-17 ENCOUNTER — Ambulatory Visit: Payer: Medicaid Other | Admitting: Physical Therapy

## 2017-12-24 ENCOUNTER — Ambulatory Visit: Payer: Medicaid Other | Attending: Family Medicine | Admitting: Physical Therapy

## 2017-12-31 ENCOUNTER — Inpatient Hospital Stay: Admission: RE | Admit: 2017-12-31 | Payer: Self-pay | Source: Ambulatory Visit

## 2018-02-08 ENCOUNTER — Emergency Department (HOSPITAL_COMMUNITY)
Admission: EM | Admit: 2018-02-08 | Discharge: 2018-02-08 | Disposition: A | Discharge status: Home / Self Care | Payer: Medicaid Other | Attending: Emergency Medicine | Admitting: Emergency Medicine

## 2018-02-08 ENCOUNTER — Encounter (HOSPITAL_COMMUNITY): Payer: Self-pay | Admitting: Emergency Medicine

## 2018-02-08 ENCOUNTER — Emergency Department (HOSPITAL_COMMUNITY): Payer: Medicaid Other

## 2018-02-08 ENCOUNTER — Other Ambulatory Visit: Payer: Self-pay

## 2018-02-08 DIAGNOSIS — F99 Mental disorder, not otherwise specified: Secondary | ICD-10-CM | POA: Insufficient documentation

## 2018-02-08 DIAGNOSIS — Z79899 Other long term (current) drug therapy: Secondary | ICD-10-CM | POA: Insufficient documentation

## 2018-02-08 DIAGNOSIS — J40 Bronchitis, not specified as acute or chronic: Secondary | ICD-10-CM

## 2018-02-08 DIAGNOSIS — J209 Acute bronchitis, unspecified: Secondary | ICD-10-CM | POA: Diagnosis not present

## 2018-02-08 DIAGNOSIS — R079 Chest pain, unspecified: Secondary | ICD-10-CM | POA: Diagnosis present

## 2018-02-08 LAB — BASIC METABOLIC PANEL
Anion gap: 10 (ref 5–15)
BUN: 7 mg/dL (ref 6–20)
CO2: 25 mmol/L (ref 22–32)
Calcium: 8.6 mg/dL — ABNORMAL LOW (ref 8.9–10.3)
Chloride: 105 mmol/L (ref 101–111)
Creatinine, Ser: 0.65 mg/dL (ref 0.44–1.00)
GFR calc Af Amer: >60 mL/min (ref >60)
GFR calc non Af Amer: >60 mL/min (ref >60)
Glucose, Bld: 107 mg/dL — ABNORMAL HIGH (ref 65–99)
Potassium: 3.5 mmol/L (ref 3.5–5.1)
Sodium: 140 mmol/L (ref 135–145)

## 2018-02-08 LAB — CBC
HCT: 40.2 % (ref 36.0–46.0)
Hemoglobin: 13.8 g/dL (ref 12.0–15.0)
MCH: 32 pg (ref 26.0–34.0)
MCHC: 34.3 g/dL (ref 30.0–36.0)
MCV: 93.3 fL (ref 78.0–100.0)
Platelets: 196 10*3/uL (ref 150–400)
RBC: 4.31 MIL/uL (ref 3.87–5.11)
RDW: 14.6 % (ref 11.5–15.5)
WBC: 7.2 10*3/uL (ref 4.0–10.5)

## 2018-02-08 LAB — BRAIN NATRIURETIC PEPTIDE: B Natriuretic Peptide: 19.3 pg/mL (ref 0.0–100.0)

## 2018-02-08 LAB — I-STAT TROPONIN, ED: Troponin i, poc: 0 ng/mL (ref 0.00–0.08)

## 2018-02-08 LAB — I-STAT BETA HCG BLOOD, ED (MC, WL, AP ONLY): I-stat hCG, quantitative: <5 m[IU]/mL (ref <5)

## 2018-02-08 MED ORDER — HYDROCODONE-ACETAMINOPHEN 5-325 MG PO TABS
2.0000 | ORAL_TABLET | Freq: Once | ORAL | Status: AC
  Administered 2018-02-08: 2 via ORAL
  Filled 2018-02-08: qty 2

## 2018-02-08 MED ORDER — AZITHROMYCIN 250 MG PO TABS: 250.0000 mg | ORAL_TABLET | Freq: Every day | ORAL | 0 refills | Status: DC

## 2018-02-08 MED ORDER — ALBUTEROL SULFATE HFA 108 (90 BASE) MCG/ACT IN AERS: 1.0000 | INHALATION_SPRAY | Freq: Four times a day (QID) | RESPIRATORY_TRACT | 0 refills | Status: DC | PRN

## 2018-02-08 MED ORDER — BENZONATATE 100 MG PO CAPS: 100.0000 mg | ORAL_CAPSULE | Freq: Three times a day (TID) | ORAL | 0 refills | Status: DC

## 2018-02-08 MED ORDER — ALBUTEROL SULFATE HFA 108 (90 BASE) MCG/ACT IN AERS
2.0000 | INHALATION_SPRAY | RESPIRATORY_TRACT | Status: DC
  Administered 2018-02-08: 2 via RESPIRATORY_TRACT
  Filled 2018-02-08: qty 6.7

## 2018-02-08 MED ORDER — AZITHROMYCIN 250 MG PO TABS
500.0000 mg | ORAL_TABLET | Freq: Once | ORAL | Status: AC
  Administered 2018-02-08: 500 mg via ORAL
  Filled 2018-02-08: qty 2

## 2018-02-08 NOTE — Discharge Instructions (Addendum)
See your Physician for recheck this week.   °

## 2018-02-08 NOTE — ED Provider Notes (Signed)
[facility] EMERGENCY DEPARTMENT Provider Note   CSN: [id number] Arrival date & time: 02/08/18  0825     History   Chief Complaint Chief Complaint  Patient presents with  . Chest Pain    HPI Jordan [name] is a 52 y.o. female.  The history is provided by the patient. No language interpreter was used.  Cough  This is a new problem. Episode onset: 3 weeks. The problem occurs constantly. The problem has been gradually worsening. The cough is productive of sputum. There has been no fever. Associated symptoms include chest pain. She has tried decongestants and cough syrup for the symptoms. She is not a smoker. Her past medical history is significant for pneumonia.  Pt complains of a cough for 3 weeks.  Pt reports she has had pain in her chest when she coughs for 2 weeks.  Pt reports the pain is with coughing.  Pt denies fever, no chills  Past Medical History:  Diagnosis Date  . Alcohol abuse   . Anemia    patient denies  . Bipolar 1 disorder (HCC)    No medications currently  . CAP (community acquired pneumonia) 03/17/2015  . 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 Mental disorder   . Obesity   . PICC line infection 05/17/2015  . Sepsis due to Gram negative bacteria (MDR E Coli) 02/18/2015  . UTI (lower urinary tract infection)   . Vaginal Pap smear, abnormal     Patient Active Problem List   Diagnosis Date Noted  . Post-operative state 03/11/2017  . Painful lumpy right breast 07/27/2016  . Urinary tract infection 07/01/2015  . Diastolic dysfunction 17/61/6073  . Constipation 05/12/2015  . ESBL (extended spectrum beta-lactamase) producing bacteria infection 03/17/2015  . Diarrhea 03/03/2015  . Weakness 03/02/2015  . 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 Generalized anxiety disorder 02/20/2015  . Claustrophobia 02/20/2015  . Transaminitis 02/18/2015  . Chest pain 02/18/2015  . Abdominal pain   . Dizziness   . Hypotension 01/04/2015    . Bipolar disorder ([place]) 06/29/2012    Past Surgical History:  Procedure Laterality Date  . CERVICAL CONIZATION W/BX N/A 07/01/2016   Procedure: CONIZATION CERVIX WITH BIOPSY;  Surgeon: Chancy Milroy, MD;  Location: [facility];  Service: Gynecology;  Laterality: N/A;  . CHOLECYSTECTOMY    . HYSTEROSCOPY W/D&C N/A 01/25/2016   Procedure: DILATATION AND CURETTAGE /HYSTEROSCOPY;  Surgeon: Mora Bellman, MD;  Location: [facility];  Service: Gynecology;  Laterality: N/A;  . VAGINAL HYSTERECTOMY N/A 03/11/2017   Procedure: HYSTERECTOMY VAGINAL WITH MORCELLATION;  Surgeon: Chancy Milroy, MD;  Location: [facility];  Service: Gynecology;  Laterality: N/A;     OB History    Gravida  1   Para  1   Term  1   Preterm      AB      Living  1     SAB      TAB      Ectopic      Multiple      Live Births  1            Home Medications    Prior to Admission medications   Medication Sig Start Date End Date Taking? Authorizing Provider  acetaminophen (TYLENOL) 500 MG tablet Take 1,000 mg by mouth every 6 (six) hours as needed for moderate pain.   Yes [provider]  guaifenesin (COUGH SYRUP) 100 MG/5ML syrup Take 200 mg by mouth 3 (three) times  daily as needed for cough.   Yes [provider]  loperamide (IMODIUM A-D) 2 MG tablet Take 2 mg by mouth 4 (four) times daily as needed for diarrhea or loose stools.   Yes [provider]  albuterol (PROVENTIL HFA;VENTOLIN HFA) 108 (90 Base) MCG/ACT inhaler Inhale 1-2 puffs into the lungs every 6 (six) hours as needed for wheezing or shortness of breath. 02/08/18   Fransico Meadow, PA-C  azithromycin (ZITHROMAX) 250 MG tablet Take 1 tablet (250 mg total) by mouth daily. Take first 2 tablets together, then 1 every day until finished. 02/08/18   Fransico Meadow, PA-C  benzonatate (TESSALON) 100 MG capsule Take 1 capsule (100 mg total) by mouth every 8 (eight) hours. 02/08/18   Fransico Meadow, PA-C    Family History Family  History  Problem Relation Age of Onset  . Alcohol abuse Father   . Cancer Father   . Breast cancer Neg Hx     Social History Social History   Tobacco Use  . Smoking status: Never Smoker  . Smokeless tobacco: Never Used  Substance Use Topics  . Alcohol use: No  . Drug use: No     Allergies   Citalopram; Lamotrigine; Sulfa antibiotics; and Tramadol   Review of Systems Review of Systems  Respiratory: Positive for cough.   Cardiovascular: Positive for chest pain.  All other systems reviewed and are negative.    Physical Exam Updated Vital Signs BP 110/78   Pulse 81   Temp (!) 97.5 F (36.4 C) (Oral)   Resp 16   Ht 5\' 8"  (1.727 m)   Wt (!) 158.8 kg (350 lb)   LMP  (LMP Unknown) Comment: negative pregnancy test result 09-20-16  SpO2 95%   BMI 53.22 kg/m   Physical Exam  Constitutional: She is oriented to person, place, and time. She appears well-developed and well-nourished.  HENT:  Head: Normocephalic.  Eyes: EOM are normal.  Neck: Normal range of motion.  Cardiovascular: Regular rhythm and normal pulses.  Pulmonary/Chest: Effort normal. She has rhonchi.  Abdominal: She exhibits no distension.  Musculoskeletal: Normal range of motion.       Right lower leg: Normal.  Neurological: She is alert and oriented to person, place, and time.  Skin: Skin is warm.  Psychiatric: She has a normal mood and affect.  Nursing note and vitals reviewed.    ED Treatments / Results  Labs (all labs ordered are listed, but only abnormal results are displayed) Labs Reviewed  BASIC METABOLIC PANEL - Abnormal; Notable for the following components:      Result Value   Glucose, Bld 107 (*)    Calcium 8.6 (*)    All other components within normal limits  CBC  BRAIN NATRIURETIC PEPTIDE  I-STAT TROPONIN, ED  I-STAT BETA HCG BLOOD, ED ([facility], [facility], [facility] ONLY)    EKG EKG Interpretation  Date/Time:  Sunday February 08 2018 08:31:01 EDT Ventricular Rate:  94 PR Interval:    QRS  Duration: 77 QT Interval:  349 QTC Calculation: 437 R Axis:   44 Text Interpretation:  Sinus rhythm Abnormal R-wave progression, early transition No significant change since last tracing Confirmed by Addison Lank [id number]) on 02/08/2018 9:36:20 AM   Radiology Dg Chest 2 View  Result Date: 02/08/2018 CLINICAL DATA:  Chest pain, shortness of breath and cough for the past 2 weeks. Dizziness. EXAM: CHEST - 2 VIEW COMPARISON:  06/13/2017. FINDINGS: Stable normal sized cardiac silhouette and prominent pulmonary vasculature and  interstitial markings. Slight increase in pleural thickening or fluid in the minor fissure. Interval small amount of linear density at the right lung base. No acute bony abnormality. IMPRESSION: 1. Interval mild increase in pleural thickening or fluid in the minor fissure. 2. Minimal right basilar atelectasis. 3. Stable chronic interstitial lung disease and mild pulmonary vascular congestion. Electronically Signed   By: Claudie Revering M.D.   On: 02/08/2018 09:25    Procedures Procedures (including critical care time)  Medications Ordered in ED Medications  albuterol (PROVENTIL HFA;VENTOLIN HFA) 108 (90 Base) MCG/ACT inhaler 2 puff (2 puffs Inhalation Given 02/08/18 1401)  HYDROcodone-acetaminophen (NORCO/VICODIN) 5-325 MG per tablet 2 tablet (2 tablets Oral Given 02/08/18 0915)  azithromycin (ZITHROMAX) tablet 500 mg (500 mg Oral Given 02/08/18 1401)     Initial Impression / Assessment and Plan / ED Course  I have reviewed the triage vital signs and the nursing notes.  Pertinent labs & imaging results that were available during my care of the patient were reviewed by me and considered in my medical decision making (see chart for details).  Clinical Course as of Feb 09 1407  Sun Feb 08, 2018  1311 Brain natriuretic peptide [LS]    Clinical Course User Index [LS] Fransico Meadow, [place]    MDM  ekg no acute troponin negative  BNP  Normal.   Pt reports no relief with  hydrocodone.   Chest xray reviewed.   I will treat pt with zithromax and albuterol.  Tessalon for cough.  Pt is advised to follow up with her MD for recheck in 2-3 days.   Final Clinical Impressions(s) / ED Diagnoses   Final diagnoses:  Bronchitis    ED Discharge Orders        Ordered    azithromycin (ZITHROMAX) 250 MG tablet  Daily     02/08/18 1349    benzonatate (TESSALON) 100 MG capsule  Every 8 hours     02/08/18 1349    albuterol (PROVENTIL HFA;VENTOLIN HFA) 108 (90 Base) MCG/ACT inhaler  Every 6 hours PRN     02/08/18 1349    An After Visit Summary was printed and given to the patient.    Fransico Meadow, [place] 02/08/18 1421    Fatima Blank, MD 02/08/18 2016

## 2018-02-08 NOTE — ED Triage Notes (Addendum)
Pt in from home via [facility] with c/o cp x 2wks. Pin described as central pressure, 10/10. Denies any fevers, endorses productive cough x 3 weeks. Has been taking cough syrup, "feels no better". Also c/o back pain x 3 mos. A&ox4, sats  97% on RA. Given 324mg  ASA PTA

## 2018-02-08 NOTE — ED Notes (Signed)
Patient transported to X-ray 

## 2018-02-08 NOTE — ED Notes (Signed)
ED Provider at bedside. 

## 2018-03-14 ENCOUNTER — Encounter (HOSPITAL_COMMUNITY): Payer: Self-pay | Admitting: Emergency Medicine

## 2018-03-14 ENCOUNTER — Emergency Department (HOSPITAL_COMMUNITY)
Admission: EM | Admit: 2018-03-14 | Discharge: 2018-03-14 | Disposition: A | Discharge status: Home / Self Care | Payer: Medicaid Other | Attending: Emergency Medicine | Admitting: Emergency Medicine

## 2018-03-14 ENCOUNTER — Emergency Department (HOSPITAL_COMMUNITY): Payer: Medicaid Other

## 2018-03-14 DIAGNOSIS — R079 Chest pain, unspecified: Secondary | ICD-10-CM | POA: Insufficient documentation

## 2018-03-14 LAB — CBC WITH DIFFERENTIAL/PLATELET
Abs Immature Granulocytes: 0 10*3/uL (ref 0.0–0.1)
Basophils Absolute: 0 10*3/uL (ref 0.0–0.1)
Basophils Relative: 0 %
Eosinophils Absolute: 0 10*3/uL (ref 0.0–0.7)
Eosinophils Relative: 0 %
HCT: 40.7 % (ref 36.0–46.0)
Hemoglobin: 13.7 g/dL (ref 12.0–15.0)
Immature Granulocytes: 0 %
Lymphocytes Relative: 25 %
Lymphs Abs: 1 10*3/uL (ref 0.7–4.0)
MCH: 32.4 pg (ref 26.0–34.0)
MCHC: 33.7 g/dL (ref 30.0–36.0)
MCV: 96.2 fL (ref 78.0–100.0)
Monocytes Absolute: 0.3 10*3/uL (ref 0.1–1.0)
Monocytes Relative: 6 %
Neutro Abs: 2.9 10*3/uL (ref 1.7–7.7)
Neutrophils Relative %: 69 %
Platelets: 144 10*3/uL — ABNORMAL LOW (ref 150–400)
RBC: 4.23 MIL/uL (ref 3.87–5.11)
RDW: 14.6 % (ref 11.5–15.5)
WBC: 4.2 10*3/uL (ref 4.0–10.5)

## 2018-03-14 LAB — COMPREHENSIVE METABOLIC PANEL
ALT: 26 U/L (ref 14–54)
AST: 29 U/L (ref 15–41)
Albumin: 3.4 g/dL — ABNORMAL LOW (ref 3.5–5.0)
Alkaline Phosphatase: 77 U/L (ref 38–126)
Anion gap: 9 (ref 5–15)
BUN: <5 mg/dL — ABNORMAL LOW (ref 6–20)
CO2: 25 mmol/L (ref 22–32)
Calcium: 8.8 mg/dL — ABNORMAL LOW (ref 8.9–10.3)
Chloride: 106 mmol/L (ref 101–111)
Creatinine, Ser: 0.68 mg/dL (ref 0.44–1.00)
GFR calc Af Amer: >60 mL/min (ref >60)
GFR calc non Af Amer: >60 mL/min (ref >60)
Glucose, Bld: 91 mg/dL (ref 65–99)
Potassium: 3.9 mmol/L (ref 3.5–5.1)
Sodium: 140 mmol/L (ref 135–145)
Total Bilirubin: 1.5 mg/dL — ABNORMAL HIGH (ref 0.3–1.2)
Total Protein: 5.9 g/dL — ABNORMAL LOW (ref 6.5–8.1)

## 2018-03-14 LAB — I-STAT TROPONIN, ED: Troponin i, poc: 0 ng/mL (ref 0.00–0.08)

## 2018-03-14 MED ORDER — KETOROLAC TROMETHAMINE 30 MG/ML IJ SOLN
30.0000 mg | Freq: Once | INTRAMUSCULAR | Status: AC
  Administered 2018-03-14: 30 mg via INTRAVENOUS
  Filled 2018-03-14: qty 1

## 2018-03-14 NOTE — ED Triage Notes (Signed)
Pt here from home with chest pain that started yesterday , pt received 2 nitro and 324mg  asa without relief

## 2018-03-14 NOTE — Discharge Instructions (Signed)
It was my pleasure taking care of you today!   You were seen in the Emergency Department today for chest pain.  As we have discussed, today?s blood work and imaging are normal, but you may require further testing.  Please call your primary care physician to schedule a follow up appointment to discuss your ER visit today.   Return to the Emergency Department if you experience any difficulty breathing, new or worsening symptoms or other symptoms that concern you.

## 2018-03-14 NOTE — ED Provider Notes (Signed)
[facility] EMERGENCY DEPARTMENT Provider Note   CSN: [id number] Arrival date & time: 03/14/18  6712     History   Chief Complaint Chief Complaint  Patient presents with  . Chest Pain    HPI Jordan [name] is a 52 y.o. female.  The history is provided by the patient and medical records. No language interpreter was used.  Chest Pain   Pertinent negatives include no palpitations.   Jordan [name] is a 52 y.o. female  with a PMH as listed below who presents to the Emergency Department complaining of nonradiating, constant chest pain across entire chest wall which began yesterday.  Patient states that she thought it was GI related, therefore she burped and had flatulence several times which did alleviate her symptoms.  She denies any associated shortness of breath, nausea, vomiting, abdominal pain, diaphoresis or back pain.  She took no medications at home.  She was given 324 ASA and nitro by EMS which did not provide her any relief.  She denies exertional component to her chest pain.  She reports that her husband has been very ill, in and out of the doctor's offices and hospital lately. She is primary care giver. This has caused her a great amount of stress.  She feels like she has a clot on her plate and that stress may be a major component to her symptoms today.  Patient denies history of hypertension, hyperlipidemia, heart disease or diabetes.  She denies family history of cardiac disease.  She is not a smoker.  No recent surgeries/immobilizations.  No long travel.  Not on hormones.  No history of DVT/PE/coagulopathy.    Past Medical History:  Diagnosis Date  . Alcohol abuse   . Anemia    patient denies  . Bipolar 1 disorder (HCC)    No medications currently  . CAP (community acquired pneumonia) 03/17/2015  . 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 Mental disorder   . Obesity   . PICC line infection 05/17/2015  . Sepsis due to Gram negative  bacteria (MDR E Coli) 02/18/2015  . UTI (lower urinary tract infection)   . Vaginal Pap smear, abnormal     Patient Active Problem List   Diagnosis Date Noted  . Post-operative state 03/11/2017  . Painful lumpy right breast 07/27/2016  . Urinary tract infection 07/01/2015  . Diastolic dysfunction 45/80/9983  . Constipation 05/12/2015  . ESBL (extended spectrum beta-lactamase) producing bacteria infection 03/17/2015  . Diarrhea 03/03/2015  . Weakness 03/02/2015  . 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 Generalized anxiety disorder 02/20/2015  . Claustrophobia 02/20/2015  . Transaminitis 02/18/2015  . Chest pain 02/18/2015  . Abdominal pain   . Dizziness   . Hypotension 01/04/2015  . Bipolar disorder ([place]) 06/29/2012    Past Surgical History:  Procedure Laterality Date  . CERVICAL CONIZATION W/BX N/A 07/01/2016   Procedure: CONIZATION CERVIX WITH BIOPSY;  Surgeon: Chancy Milroy, MD;  Location: [facility];  Service: Gynecology;  Laterality: N/A;  . CHOLECYSTECTOMY    . HYSTEROSCOPY W/D&C N/A 01/25/2016   Procedure: DILATATION AND CURETTAGE /HYSTEROSCOPY;  Surgeon: Mora Bellman, MD;  Location: [facility];  Service: Gynecology;  Laterality: N/A;  . VAGINAL HYSTERECTOMY N/A 03/11/2017   Procedure: HYSTERECTOMY VAGINAL WITH MORCELLATION;  Surgeon: Chancy Milroy, MD;  Location: [facility];  Service: Gynecology;  Laterality: N/A;     OB History    Gravida  1   Para  1   Term  1  Preterm      AB      Living  1     SAB      TAB      Ectopic      Multiple      Live Births  1            Home Medications    Prior to Admission medications   Medication Sig Start Date End Date Taking? Authorizing Provider  acetaminophen (TYLENOL) 500 MG tablet Take 1,000 mg by mouth every 6 (six) hours as needed for moderate pain.   Yes [provider]  albuterol (PROVENTIL HFA;VENTOLIN HFA) 108 (90 Base) MCG/ACT inhaler Inhale 1-2 puffs into the lungs every 6 (six) hours as needed  for wheezing or shortness of breath. Patient not taking: Reported on 03/14/2018 02/08/18   Fransico Meadow, PA-C  benzonatate (TESSALON) 100 MG capsule Take 1 capsule (100 mg total) by mouth every 8 (eight) hours. Patient not taking: Reported on 03/14/2018 02/08/18   Sidney Ace    Family History Family History  Problem Relation Age of Onset  . Alcohol abuse Father   . Cancer Father   . Breast cancer Neg Hx     Social History Social History   Tobacco Use  . Smoking status: Never Smoker  . Smokeless tobacco: Never Used  Substance Use Topics  . Alcohol use: No  . Drug use: No     Allergies   Citalopram; Lamotrigine; Sulfa antibiotics; and Tramadol   Review of Systems Review of Systems  Cardiovascular: Positive for chest pain. Negative for palpitations and leg swelling.  All other systems reviewed and are negative.    Physical Exam Updated Vital Signs BP (!) 119/93   Pulse 91   Temp 98.5 F (36.9 C) (Oral)   Resp 18   LMP  (LMP Unknown) Comment: negative pregnancy test result 09-20-16  SpO2 98%   Physical Exam  Constitutional: She is oriented to person, place, and time. She appears well-developed and well-nourished. No distress.  HENT:  Head: Normocephalic and atraumatic.  Cardiovascular: Normal rate, regular rhythm and normal heart sounds.  No murmur heard. Pulmonary/Chest: Effort normal and breath sounds normal. No respiratory distress. She has no wheezes. She has no rales. She exhibits tenderness.  Abdominal: Soft. She exhibits no distension. There is no tenderness.  Musculoskeletal:  No lower extremity edema or calf tenderness.  Neurological: She is alert and oriented to person, place, and time.  Skin: Skin is warm and dry.  Nursing note and vitals reviewed.    ED Treatments / Results  Labs (all labs ordered are listed, but only abnormal results are displayed) Labs Reviewed  CBC WITH DIFFERENTIAL/PLATELET - Abnormal; Notable for the following  components:      Result Value   Platelets 144 (*)    All other components within normal limits  COMPREHENSIVE METABOLIC PANEL - Abnormal; Notable for the following components:   BUN <5 (*)    Calcium 8.8 (*)    Total Protein 5.9 (*)    Albumin 3.4 (*)    Total Bilirubin 1.5 (*)    All other components within normal limits  I-STAT TROPONIN, ED    EKG EKG Interpretation  Date/Time:  Saturday Mar 14 2018 09:55:31 EDT Ventricular Rate:  98 PR Interval:    QRS Duration: 75 QT Interval:  338 QTC Calculation: 432 R Axis:   41 Text Interpretation:  Sinus rhythm Low voltage, precordial leads Abnormal R-wave progression, early transition  No significant change since last tracing Confirmed by Wandra Arthurs ([id number]) on 03/14/2018 10:37:08 AM   Radiology Dg Chest 2 View  Result Date: 03/14/2018 CLINICAL DATA:  Chest pain starting yesterday. EXAM: CHEST - 2 VIEW COMPARISON:  02/08/2018 FINDINGS: Lateral view degraded by patient arm position. Midline trachea. Normal heart size and mediastinal contours. Right hemidiaphragm elevation is mild. No pleural effusion or pneumothorax. Volume loss at the right lung base with scarring laterally. Resolved fluid in the right minor fissure with minimal fissural thickening remaining. IMPRESSION: No acute cardiopulmonary disease. Electronically Signed   By: Abigail Miyamoto M.D.   On: 03/14/2018 10:49    Procedures Procedures (including critical care time)  Medications Ordered in ED Medications  ketorolac (TORADOL) 30 MG/ML injection 30 mg (30 mg Intravenous Given 03/14/18 1104)     Initial Impression / Assessment and Plan / ED Course  I have reviewed the triage vital signs and the nursing notes.  Pertinent labs & imaging results that were available during my care of the patient were reviewed by me and considered in my medical decision making (see chart for details).    Jordan [name] is a 52 y.o. female who presents to ED for chest pain with no associated  symptoms which began yesterday. No exertional or pleuritic component. On exam, patient is afebrile, hemodynamically stable. Pain is reproducible with palpation to chest wall.   Labs reviewed and reassuring with negative troponin. CXR with no acute abnormalities.  EKG unchanged from previous.  Heart score low risk at 3. ACS unlikely. Low risk Well's - doubt PE.   Patient's symptoms unlikely to be of cardiac etiology. Labs and imaging reviewed again prior to discharge. Patient has been advised to return to the ED if development of any exertional chest pain, trouble breathing, new/worsening symptoms or for any additional concerns. Evaluation does not show pathology that would require ongoing emergent intervention or inpatient treatment. Encouraged to follow up with PCP.  Patient understands return precautions and follow up plan. All questions answered.   Final Clinical Impressions(s) / ED Diagnoses   Final diagnoses:  Chest pain    ED Discharge Orders    None       Braidon Chermak, Ozella Almond, PA-C 03/14/18 1201    Drenda Freeze, MD 03/14/18 [phone]

## 2018-03-14 NOTE — ED Notes (Signed)
Patient transported to X-ray 

## 2018-06-26 ENCOUNTER — Inpatient Hospital Stay (HOSPITAL_COMMUNITY)
Admission: AD | Admit: 2018-06-26 | Discharge: 2018-06-26 | Disposition: A | Discharge status: Home / Self Care | Payer: Medicaid Other | Source: Ambulatory Visit | Attending: Obstetrics and Gynecology | Admitting: Obstetrics and Gynecology

## 2018-06-26 ENCOUNTER — Encounter (HOSPITAL_COMMUNITY): Payer: Self-pay | Admitting: *Deleted

## 2018-06-26 DIAGNOSIS — N3000 Acute cystitis without hematuria: Secondary | ICD-10-CM | POA: Diagnosis not present

## 2018-06-26 DIAGNOSIS — E669 Obesity, unspecified: Secondary | ICD-10-CM | POA: Diagnosis not present

## 2018-06-26 DIAGNOSIS — Z87440 Personal history of urinary (tract) infections: Secondary | ICD-10-CM | POA: Diagnosis not present

## 2018-06-26 DIAGNOSIS — Z90710 Acquired absence of both cervix and uterus: Secondary | ICD-10-CM | POA: Diagnosis not present

## 2018-06-26 DIAGNOSIS — Z6841 Body Mass Index (BMI) 40.0 and over, adult: Secondary | ICD-10-CM | POA: Diagnosis not present

## 2018-06-26 DIAGNOSIS — Z888 Allergy status to other drugs, medicaments and biological substances status: Secondary | ICD-10-CM | POA: Insufficient documentation

## 2018-06-26 DIAGNOSIS — N898 Other specified noninflammatory disorders of vagina: Secondary | ICD-10-CM | POA: Diagnosis present

## 2018-06-26 DIAGNOSIS — Z882 Allergy status to sulfonamides status: Secondary | ICD-10-CM | POA: Insufficient documentation

## 2018-06-26 LAB — URINALYSIS, ROUTINE W REFLEX MICROSCOPIC
Bilirubin Urine: NEGATIVE
Glucose, UA: NEGATIVE mg/dL
Hgb urine dipstick: NEGATIVE
Ketones, ur: NEGATIVE mg/dL
Nitrite: POSITIVE — AB
Protein, ur: 30 mg/dL — AB
Specific Gravity, Urine: 1.029 (ref 1.005–1.030)
WBC, UA: >50 WBC/hpf — ABNORMAL HIGH (ref 0–5)
pH: 5 (ref 5.0–8.0)

## 2018-06-26 LAB — WET PREP, GENITAL
Clue Cells Wet Prep HPF POC: NONE SEEN
Sperm: NONE SEEN
Trich, Wet Prep: NONE SEEN
Yeast Wet Prep HPF POC: NONE SEEN

## 2018-06-26 MED ORDER — NITROFURANTOIN MONOHYD MACRO 100 MG PO CAPS: 100.0000 mg | ORAL_CAPSULE | Freq: Two times a day (BID) | ORAL | 0 refills | Status: DC

## 2018-06-26 NOTE — MAU Note (Signed)
Having vaginal odor for 6 months. Denies vaginal d/c, itching. No dysuria

## 2018-06-26 NOTE — MAU Provider Note (Signed)
History     CSN: [id number]  Arrival date and time: 06/26/18 2203   First Provider Initiated Contact with Patient 06/26/18 2329      Chief Complaint  Patient presents with  . vaginal odor   Jordan [name] is a 52 y.o. G1P1001 who presents today with vaginal odor x 6 months. She states that she has seen her PCP and they can never find a cause of the odor. She denies any VB or pain at this time.   Vaginal Discharge  The patient's primary symptoms include a genital odor and vaginal discharge. The patient's pertinent negatives include no pelvic pain. This is a new problem. Episode onset: 6 months ago. The problem occurs constantly. The problem has been unchanged. The patient is experiencing no pain. She is not pregnant. Pertinent negatives include no chills, diarrhea, dysuria, fever, frequency, nausea or vomiting. The vaginal discharge was normal. There has been no bleeding. Nothing aggravates the symptoms. She has tried nothing for the symptoms.    OB History    Gravida  1   Para  1   Term  1   Preterm      AB      Living  1     SAB      TAB      Ectopic      Multiple      Live Births  1           Past Medical History:  Diagnosis Date  . Alcohol abuse   . Anemia    patient denies  . Bipolar 1 disorder (HCC)    No medications currently  . CAP (community acquired pneumonia) 03/17/2015  . 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 Mental disorder   . Obesity   . PICC line infection 05/17/2015  . Sepsis due to Gram negative bacteria (MDR E Coli) 02/18/2015  . UTI (lower urinary tract infection)   . Vaginal Pap smear, abnormal     Past Surgical History:  Procedure Laterality Date  . CERVICAL CONIZATION W/BX N/A 07/01/2016   Procedure: CONIZATION CERVIX WITH BIOPSY;  Surgeon: Chancy Milroy, MD;  Location: [facility];  Service: Gynecology;  Laterality: N/A;  . CHOLECYSTECTOMY    . HYSTEROSCOPY W/D&C N/A 01/25/2016   Procedure: DILATATION AND  CURETTAGE /HYSTEROSCOPY;  Surgeon: Mora Bellman, MD;  Location: [facility];  Service: Gynecology;  Laterality: N/A;  . VAGINAL HYSTERECTOMY N/A 03/11/2017   Procedure: HYSTERECTOMY VAGINAL WITH MORCELLATION;  Surgeon: Chancy Milroy, MD;  Location: [facility];  Service: Gynecology;  Laterality: N/A;    Family History  Problem Relation Age of Onset  . Alcohol abuse Father   . Cancer Father   . Breast cancer Neg Hx     Social History   Tobacco Use  . Smoking status: Never Smoker  . Smokeless tobacco: Never Used  Substance Use Topics  . Alcohol use: No  . Drug use: No    Allergies:  Allergies  Allergen Reactions  . Citalopram Other (See Comments)    Possible cause of pancytopenia. Swelling of tongue, face and throat  . Lamotrigine Other (See Comments)    Possible cause of pancytopenia. Swelling of face, throat and tongue  . Sulfa Antibiotics Anaphylaxis  . Tramadol Anaphylaxis, Shortness Of Breath and Swelling    Medications Prior to Admission  Medication Sig Dispense Refill Last Dose  . acetaminophen (TYLENOL) 500 MG tablet Take 1,000 mg by mouth every 6 (six) hours as needed  for moderate pain.   Past Month at Unknown time  . albuterol (PROVENTIL HFA;VENTOLIN HFA) 108 (90 Base) MCG/ACT inhaler Inhale 1-2 puffs into the lungs every 6 (six) hours as needed for wheezing or shortness of breath. (Patient not taking: Reported on 03/14/2018) 1 Inhaler 0 Not Taking at Unknown time  . benzonatate (TESSALON) 100 MG capsule Take 1 capsule (100 mg total) by mouth every 8 (eight) hours. (Patient not taking: Reported on 03/14/2018) 21 capsule 0 Completed Course at Unknown time    Review of Systems  Constitutional: Negative for chills and fever.  Gastrointestinal: Negative for diarrhea, nausea and vomiting.  Genitourinary: Positive for vaginal discharge. Negative for dysuria, frequency, pelvic pain and vaginal bleeding.   Physical Exam   Blood pressure 135/74, pulse 84, temperature 97.9 F  (36.6 C), resp. rate 18, height 5\' 8"  (1.727 m), weight 133.8 kg.  Physical Exam  Nursing note and vitals reviewed. Constitutional: She is oriented to person, place, and time. She appears well-developed and well-nourished. No distress.  HENT:  Head: Normocephalic.  Cardiovascular: Normal rate.  Respiratory: Effort normal.  GI: Soft. There is no tenderness. There is no rebound.  Neurological: She is alert and oriented to person, place, and time.  Skin: Skin is warm and dry.  Psychiatric: She has a normal mood and affect.     Results for orders placed or performed during the hospital encounter of 06/26/18 (from the past 24 hour(s))  Wet prep, genital     Status: Abnormal   Collection Time: 06/26/18 10:54 PM  Result Value Ref Range   Yeast Wet Prep HPF POC NONE SEEN NONE SEEN   Trich, Wet Prep NONE SEEN NONE SEEN   Clue Cells Wet Prep HPF POC NONE SEEN NONE SEEN   WBC, Wet Prep HPF POC FEW (A) NONE SEEN   Sperm NONE SEEN   Urinalysis, Routine w reflex microscopic     Status: Abnormal   Collection Time: 06/26/18 10:55 PM  Result Value Ref Range   Color, Urine AMBER (A) YELLOW   APPearance CLOUDY (A) CLEAR   Specific Gravity, Urine 1.029 1.005 - 1.030   pH 5.0 5.0 - 8.0   Glucose, UA NEGATIVE NEGATIVE mg/dL   Hgb urine dipstick NEGATIVE NEGATIVE   Bilirubin Urine NEGATIVE NEGATIVE   Ketones, ur NEGATIVE NEGATIVE mg/dL   Protein, ur 30 (A) NEGATIVE mg/dL   Nitrite POSITIVE (A) NEGATIVE   Leukocytes, UA MODERATE (A) NEGATIVE   RBC / HPF 21-50 0 - 5 RBC/hpf   WBC, UA >50 (H) 0 - 5 WBC/hpf   Bacteria, UA MANY (A) NONE SEEN   Squamous Epithelial / LPF 11-20 0 - 5   WBC Clumps PRESENT    Mucus PRESENT    Ca Oxalate Crys, UA PRESENT     MAU Course  Procedures  MDM   Assessment and Plan   1. Acute cystitis without hematuria    DC home Comfort measures reviewed  RX: macrobid BID x 5 days  Return to MAU as needed   Follow-up Information    Department, [facility] Follow up.   Contact information: [place] [address] [phone]            [name] 06/26/2018, 11:29 PM

## 2018-06-26 NOTE — Discharge Instructions (Signed)
In late 2019, the [facility] will be moving to the [place] campus. At that time, the MAU (Maternity Admissions Unit), where you are being seen today, will no longer take care of non-pregnant patients. We strongly encourage you to find a doctor's office before that time, so that you can be seen with any GYN concerns, like vaginal discharge, urinary tract infection, etc.. in a timely manner. ° °In order to make an office visit more convenient, the [facility] will be offering evening hours with same-day appointments, walk-in appointments and scheduled appointments available during this time. ° °[facility] Hours: °Monday - 8am - 7:30 pm with walk-in between 4pm- 7:30 pm °Tuesday - 8 am - 5 pm (starting 01/20/18 we will be open late and accepting walk-ins from 4pm - 7:30pm) °Wednesday - 8 am - 5 pm (starting 04/22/18 we will be open late and accepting walk-ins from 4pm - 7:30pm) °Thursday 8 am - 5 pm (starting 07/23/18 we will be open late and accepting walk-ins from 4pm - 7:30pm) °Friday 8 am - 5 pm ° °For an appointment please call the [facility] at [phone] ° °For urgent needs, [facility] is also available for management of urgent GYN complaints such as vaginal discharge or urinary tract infections. ° ° ° ° ° °

## 2018-06-26 NOTE — MAU Note (Signed)
[name] CNM in Triage to discuss test results and d/c plan with pt. Pt then d/c home from Triage.

## 2018-06-26 NOTE — MAU Note (Signed)
Pt did vaginal self swabs and then back to lobby. Understands will wait for some results tonight and then will see provider in Triage. Agrees with [employer]

## 2018-06-28 LAB — URINE CULTURE

## 2018-06-29 LAB — GC/CHLAMYDIA PROBE AMP (~~LOC~~) NOT AT ARMC
Chlamydia: NEGATIVE
Neisseria Gonorrhea: NEGATIVE

## 2018-07-01 ENCOUNTER — Emergency Department (HOSPITAL_COMMUNITY): Payer: Medicaid Other

## 2018-07-01 ENCOUNTER — Emergency Department (HOSPITAL_COMMUNITY)
Admission: EM | Admit: 2018-07-01 | Discharge: 2018-07-01 | Disposition: A | Discharge status: Home / Self Care | Payer: Medicaid Other | Attending: Emergency Medicine | Admitting: Emergency Medicine

## 2018-07-01 ENCOUNTER — Other Ambulatory Visit: Payer: Self-pay

## 2018-07-01 ENCOUNTER — Encounter (HOSPITAL_COMMUNITY): Payer: Self-pay | Admitting: Emergency Medicine

## 2018-07-01 DIAGNOSIS — Z79899 Other long term (current) drug therapy: Secondary | ICD-10-CM | POA: Diagnosis not present

## 2018-07-01 DIAGNOSIS — R079 Chest pain, unspecified: Secondary | ICD-10-CM | POA: Insufficient documentation

## 2018-07-01 DIAGNOSIS — R0602 Shortness of breath: Secondary | ICD-10-CM | POA: Diagnosis present

## 2018-07-01 LAB — BASIC METABOLIC PANEL
Anion gap: 11 (ref 5–15)
BUN: 7 mg/dL (ref 6–20)
CO2: 27 mmol/L (ref 22–32)
Calcium: 9.9 mg/dL (ref 8.9–10.3)
Chloride: 108 mmol/L (ref 98–111)
Creatinine, Ser: 0.66 mg/dL (ref 0.44–1.00)
GFR calc Af Amer: >60 mL/min (ref >60)
GFR calc non Af Amer: >60 mL/min (ref >60)
Glucose, Bld: 89 mg/dL (ref 70–99)
Potassium: 3.8 mmol/L (ref 3.5–5.1)
Sodium: 146 mmol/L — ABNORMAL HIGH (ref 135–145)

## 2018-07-01 LAB — BRAIN NATRIURETIC PEPTIDE: B Natriuretic Peptide: 77.6 pg/mL (ref 0.0–100.0)

## 2018-07-01 LAB — CBC WITH DIFFERENTIAL/PLATELET
Basophils Absolute: 0 10*3/uL (ref 0.0–0.1)
Basophils Relative: 0 %
Eosinophils Absolute: 0 10*3/uL (ref 0.0–0.7)
Eosinophils Relative: 0 %
HCT: 46.5 % — ABNORMAL HIGH (ref 36.0–46.0)
Hemoglobin: 16.2 g/dL — ABNORMAL HIGH (ref 12.0–15.0)
Lymphocytes Relative: 14 %
Lymphs Abs: 0.8 10*3/uL (ref 0.7–4.0)
MCH: 33.1 pg (ref 26.0–34.0)
MCHC: 34.8 g/dL (ref 30.0–36.0)
MCV: 95.1 fL (ref 78.0–100.0)
Monocytes Absolute: 0.4 10*3/uL (ref 0.1–1.0)
Monocytes Relative: 7 %
Neutro Abs: 4.6 10*3/uL (ref 1.7–7.7)
Neutrophils Relative %: 79 %
Platelets: 168 10*3/uL (ref 150–400)
RBC: 4.89 MIL/uL (ref 3.87–5.11)
RDW: 14.1 % (ref 11.5–15.5)
WBC: 5.9 10*3/uL (ref 4.0–10.5)

## 2018-07-01 LAB — TROPONIN I: Troponin I: <0.03 ng/mL (ref <0.03)

## 2018-07-01 MED ORDER — IBUPROFEN 200 MG PO TABS
600.0000 mg | ORAL_TABLET | Freq: Once | ORAL | Status: AC
  Administered 2018-07-01: 600 mg via ORAL
  Filled 2018-07-01: qty 3

## 2018-07-01 NOTE — ED Notes (Signed)
Pt has been given a cup of chicken broth at this time per her request of being "cold".

## 2018-07-01 NOTE — ED Triage Notes (Signed)
Patient BIB [facility] from home for SOB, Chest pain, chest wall pain, and cough x1 week. Pt unable to take deep breath, coughs and doubles over in pain. Pt is very emotional, hx of bi-polar.

## 2018-07-01 NOTE — ED Notes (Signed)
Bed: RA74 Expected date:  Expected time:  Means of arrival:  Comments: EMS/uri

## 2018-07-01 NOTE — ED Provider Notes (Signed)
[facility] DEPT Provider Note   CSN: [id number] Arrival date & time: 07/01/18  0086     History   Chief Complaint Chief Complaint  Patient presents with  . Shortness of Breath  . Chest Pain    HPI Jordan [name] is a 52 y.o. female.  HPI   52 year old female with chest pain.  Onset about a week ago.  Pain is been constant/persistent since then.  Associated cough.  No shortness of breath.  Denies any change with cough or other exacerbating factors.  No fevers or chills.  No unusual leg pain or swelling.  Has been taking Tylenol with mild improvement.  Past Medical History:  Diagnosis Date  . Alcohol abuse   . Anemia    patient denies  . Bipolar 1 disorder (HCC)    No medications currently  . CAP (community acquired pneumonia) 03/17/2015  . 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 Mental disorder   . Obesity   . PICC line infection 05/17/2015  . Sepsis due to Gram negative bacteria (MDR E Coli) 02/18/2015  . UTI (lower urinary tract infection)   . Vaginal Pap smear, abnormal     Patient Active Problem List   Diagnosis Date Noted  . Post-operative state 03/11/2017  . Painful lumpy right breast 07/27/2016  . Urinary tract infection 07/01/2015  . Diastolic dysfunction 76/19/5093  . Constipation 05/12/2015  . ESBL (extended spectrum beta-lactamase) producing bacteria infection 03/17/2015  . Diarrhea 03/03/2015  . Weakness 03/02/2015  . 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 Generalized anxiety disorder 02/20/2015  . Claustrophobia 02/20/2015  . Transaminitis 02/18/2015  . Chest pain 02/18/2015  . Abdominal pain   . Dizziness   . Hypotension 01/04/2015  . Bipolar disorder ([place]) 06/29/2012    Past Surgical History:  Procedure Laterality Date  . CERVICAL CONIZATION W/BX N/A 07/01/2016   Procedure: CONIZATION CERVIX WITH BIOPSY;  Surgeon: Chancy Milroy, MD;  Location: [facility];  Service: Gynecology;  Laterality: N/A;  .  CHOLECYSTECTOMY    . HYSTEROSCOPY W/D&C N/A 01/25/2016   Procedure: DILATATION AND CURETTAGE /HYSTEROSCOPY;  Surgeon: Mora Bellman, MD;  Location: [facility];  Service: Gynecology;  Laterality: N/A;  . VAGINAL HYSTERECTOMY N/A 03/11/2017   Procedure: HYSTERECTOMY VAGINAL WITH MORCELLATION;  Surgeon: Chancy Milroy, MD;  Location: [facility];  Service: Gynecology;  Laterality: N/A;     OB History    Gravida  1   Para  1   Term  1   Preterm      AB      Living  1     SAB      TAB      Ectopic      Multiple      Live Births  1            Home Medications    Prior to Admission medications   Medication Sig Start Date End Date Taking? Authorizing Provider  acetaminophen (TYLENOL) 500 MG tablet Take 1,000 mg by mouth every 6 (six) hours as needed for moderate pain.   Yes [provider]    Family History Family History  Problem Relation Age of Onset  . Alcohol abuse Father   . Cancer Father   . Breast cancer Neg Hx     Social History Social History   Tobacco Use  . Smoking status: Never Smoker  . Smokeless tobacco: Never Used  Substance Use Topics  . Alcohol use: No  .  Drug use: No     Allergies   Citalopram; Lamotrigine; Sulfa antibiotics; and Tramadol   Review of Systems Review of Systems  All systems reviewed and negative, other than as noted in HPI.  Physical Exam Updated Vital Signs BP 127/86 (BP Location: Left Arm)   Pulse 100   Temp 97.9 F (36.6 C) (Oral)   Resp (!) 24   Ht 5\' 8"  (1.727 m)   Wt 90.7 kg   LMP  (LMP Unknown) Comment: negative pregnancy test result 09-20-16  SpO2 97%   BMI 30.41 kg/m   Physical Exam  Constitutional: She appears well-developed and well-nourished. No distress.  HENT:  Head: Normocephalic and atraumatic.  Eyes: Conjunctivae are normal. Right eye exhibits no discharge. Left eye exhibits no discharge.  Neck: Neck supple.  Cardiovascular: Normal rate, regular rhythm and normal heart sounds. Exam  reveals no gallop and no friction rub.  No murmur heard. Pulmonary/Chest: Effort normal and breath sounds normal. No respiratory distress.  Abdominal: Soft. She exhibits no distension. There is no tenderness.  Musculoskeletal: She exhibits no edema or tenderness.  Lower extremities symmetric as compared to each other. No calf tenderness. Negative Homan's. No palpable cords.   Neurological: She is alert.  Skin: Skin is warm and dry.  Psychiatric: She has a normal mood and affect. Her behavior is normal. Thought content normal.  Nursing note and vitals reviewed.    ED Treatments / Results  Labs (all labs ordered are listed, but only abnormal results are displayed) Labs Reviewed  CBC WITH DIFFERENTIAL/PLATELET - Abnormal; Notable for the following components:      Result Value   Hemoglobin 16.2 (*)    HCT 46.5 (*)    All other components within normal limits  BASIC METABOLIC PANEL - Abnormal; Notable for the following components:   Sodium 146 (*)    All other components within normal limits  BRAIN NATRIURETIC PEPTIDE  TROPONIN I    EKG EKG Interpretation  Date/Time:  Wednesday July 01 2018 08:46:15 EDT Ventricular Rate:  104 PR Interval:    QRS Duration: 74 QT Interval:  332 QTC Calculation: 437 R Axis:   58 Text Interpretation:  Sinus tachycardia Abnormal R-wave progression, early transition No significant change since last tracing Confirmed by Virgel Manifold [phone]) on 07/01/2018 9:27:28 AM   Radiology Dg Chest 2 View  Result Date: 07/01/2018 CLINICAL DATA:  Chest pain for 1 week. EXAM: CHEST - 2 VIEW COMPARISON:  None. FINDINGS: AP and lateral views of the chest show low volumes. The lungs are clear without focal pneumonia, edema, pneumothorax or pleural effusion. Interstitial markings are diffusely coarsened with chronic features. Chronic atelectasis or scarring at the right base is similar. Cardiopericardial silhouette is at upper limits of normal for size. The  visualized bony structures of the thorax are intact. Telemetry leads overlie the chest. IMPRESSION: Stable.  Right base scarring or atelectasis. Electronically Signed   By: Misty Stanley M.D.   On: 07/01/2018 10:17    Procedures Procedures (including critical care time)  Medications Ordered in ED Medications  ibuprofen (ADVIL,MOTRIN) tablet 600 mg (has no administration in time range)     Initial Impression / Assessment and Plan / ED Course  I have reviewed the triage vital signs and the nursing notes.  Pertinent labs & imaging results that were available during my care of the patient were reviewed by me and considered in my medical decision making (see chart for details).     52 year old female with  chest pain.  Atypical for ACS given constant duration for about a week.  She denies any specific exacerbating relieving factors.  She is afebrile.  She has no increased work of breathing.  She sounds clear on exam.  Chest x-ray without acute abnormality.  EKG does not appear sniffily change from prior.  Basic labs fairly unrevealing.  I doubt emergent process such as PE, dissection, etc.  I have reviewed the triage vital signs and the nursing notes. Prior records were reviewed for additional information.    Pertinent labs & imaging results that were available during my care of the patient were reviewed by me and considered in my medical decision making (see chart for details).   Final Clinical Impressions(s) / ED Diagnoses   Final diagnoses:  Nonspecific chest pain    ED Discharge Orders    None       Virgel Manifold, MD 07/01/18 1055

## 2018-07-08 ENCOUNTER — Emergency Department (HOSPITAL_COMMUNITY)
Admission: EM | Admit: 2018-07-08 | Discharge: 2018-07-08 | Disposition: A | Discharge status: Home / Self Care | Payer: Medicaid Other | Attending: Emergency Medicine | Admitting: Emergency Medicine

## 2018-07-08 ENCOUNTER — Other Ambulatory Visit: Payer: Self-pay

## 2018-07-08 ENCOUNTER — Encounter (HOSPITAL_COMMUNITY): Payer: Self-pay

## 2018-07-08 DIAGNOSIS — F319 Bipolar disorder, unspecified: Secondary | ICD-10-CM | POA: Diagnosis not present

## 2018-07-08 DIAGNOSIS — R197 Diarrhea, unspecified: Secondary | ICD-10-CM | POA: Diagnosis present

## 2018-07-08 LAB — C DIFFICILE QUICK SCREEN W PCR REFLEX
C Diff antigen: NEGATIVE
C Diff interpretation: NOT DETECTED
C Diff toxin: NEGATIVE

## 2018-07-08 LAB — OCCULT BLOOD X 1 CARD TO LAB, STOOL: Fecal Occult Bld: NEGATIVE

## 2018-07-08 MED ORDER — LOPERAMIDE HCL 2 MG PO CAPS
4.0000 mg | ORAL_CAPSULE | Freq: Once | ORAL | Status: AC
  Administered 2018-07-08: 4 mg via ORAL
  Filled 2018-07-08: qty 2

## 2018-07-08 MED ORDER — PROMETHAZINE HCL 25 MG RE SUPP: 25.0000 mg | Freq: Four times a day (QID) | RECTAL | 0 refills | Status: DC | PRN

## 2018-07-08 MED ORDER — LOPERAMIDE HCL 2 MG PO CAPS: 4.0000 mg | ORAL_CAPSULE | Freq: Four times a day (QID) | ORAL | 0 refills | Status: DC | PRN

## 2018-07-08 MED ORDER — FAMOTIDINE 20 MG PO TABS: 20.0000 mg | ORAL_TABLET | Freq: Two times a day (BID) | ORAL | 0 refills | Status: DC

## 2018-07-08 NOTE — Discharge Instructions (Addendum)
A stool test has been obtained in the emergency department.  Results will be ready in 1 to 2 days.  Continue to stay hydrated at home.  Keep liquids at your bedside.  Take Imodium as prescribed.  Follow-up with your family doctor for recheck within 2 to 4 days.

## 2018-07-08 NOTE — ED Triage Notes (Signed)
EMS reports from home diarrhea begining this morning, took antidiarrheal med without resolve. Denies nausea, vomiting.  BP 120/78 HR 94 RR 18

## 2018-07-08 NOTE — ED Notes (Signed)
Pt has had diarrhea multiple time since being in the department. RN made aware. Pt given bedside commode to use and multiple items to clean herself with. PT unhappy with this and request this NT to stay in room to "keep hme clean". I explained to Pt that I had other pt to help assist to restroom and I would keep a check on her for assistance.

## 2018-07-08 NOTE — ED Provider Notes (Signed)
[facility] DEPT Provider Note   CSN: [id number] Arrival date & time: 07/08/18  1049     History   Chief Complaint Chief Complaint  Patient presents with  . Diarrhea    HPI Jordan [name] is a 52 y.o. female.  HPI Patient has recurrent intermittent diarrhea.  Last time this occurred was over a month ago.  She started this morning having repeat episodes of stool.  No associated abdominal pain.  No associated vomiting.  No associated fever.  Patient reports he did start taking some medications she had leftover from prior episode of diarrhea.  This is not helping.  Patient denies any known history of C. difficile.  She reports that her doctor wanted her to get a colonoscopy but she has never done it because she refuses to do the colon prep at home.  She reports the only way she will ever do colonoscopy as if she gets admitted to the hospital to do it. Past Medical History:  Diagnosis Date  . Alcohol abuse   . Anemia    patient denies  . Bipolar 1 disorder (HCC)    No medications currently  . CAP (community acquired pneumonia) 03/17/2015  . 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 Mental disorder   . Obesity   . PICC line infection 05/17/2015  . Sepsis due to Gram negative bacteria (MDR E Coli) 02/18/2015  . UTI (lower urinary tract infection)   . Vaginal Pap smear, abnormal     Patient Active Problem List   Diagnosis Date Noted  . Post-operative state 03/11/2017  . Painful lumpy right breast 07/27/2016  . Urinary tract infection 07/01/2015  . Diastolic dysfunction 28/41/3244  . Constipation 05/12/2015  . ESBL (extended spectrum beta-lactamase) producing bacteria infection 03/17/2015  . Diarrhea 03/03/2015  . Weakness 03/02/2015  . 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 Generalized anxiety disorder 02/20/2015  . Claustrophobia 02/20/2015  . Transaminitis 02/18/2015  . Chest pain 02/18/2015  . Abdominal pain   . Dizziness   .  Hypotension 01/04/2015  . Bipolar disorder ([place]) 06/29/2012    Past Surgical History:  Procedure Laterality Date  . CERVICAL CONIZATION W/BX N/A 07/01/2016   Procedure: CONIZATION CERVIX WITH BIOPSY;  Surgeon: Chancy Milroy, MD;  Location: [facility];  Service: Gynecology;  Laterality: N/A;  . CHOLECYSTECTOMY    . HYSTEROSCOPY W/D&C N/A 01/25/2016   Procedure: DILATATION AND CURETTAGE /HYSTEROSCOPY;  Surgeon: Mora Bellman, MD;  Location: [facility];  Service: Gynecology;  Laterality: N/A;  . VAGINAL HYSTERECTOMY N/A 03/11/2017   Procedure: HYSTERECTOMY VAGINAL WITH MORCELLATION;  Surgeon: Chancy Milroy, MD;  Location: [facility];  Service: Gynecology;  Laterality: N/A;     OB History    Gravida  1   Para  1   Term  1   Preterm      AB      Living  1     SAB      TAB      Ectopic      Multiple      Live Births  1            Home Medications    Prior to Admission medications   Medication Sig Start Date End Date Taking? Authorizing Provider  acetaminophen (TYLENOL) 500 MG tablet Take 1,000 mg by mouth every 6 (six) hours as needed for moderate pain.   Yes [provider]  loperamide (IMODIUM) 2 MG capsule Take 2 capsules (4 mg  total) by mouth 4 (four) times daily as needed for diarrhea or loose stools. 07/08/18   Charlesetta Shanks, MD    Family History Family History  Problem Relation Age of Onset  . Alcohol abuse Father   . Cancer Father   . Breast cancer Neg Hx     Social History Social History   Tobacco Use  . Smoking status: Never Smoker  . Smokeless tobacco: Never Used  Substance Use Topics  . Alcohol use: No  . Drug use: No     Allergies   Citalopram; Lamotrigine; Sulfa antibiotics; and Tramadol   Review of Systems Review of Systems 10 Systems reviewed and are negative for acute change except as noted in the HPI.  Physical Exam Updated Vital Signs BP (!) 140/121   Pulse 96   Temp 97.7 F (36.5 C) (Oral)   Resp 16   LMP  (LMP  Unknown) Comment: negative pregnancy test result 09-20-16  SpO2 96%   Physical Exam  Constitutional: She is oriented to person, place, and time.  Patient is alert and nontoxic.  No distress.  Morbid obesity.  HENT:  Head: Normocephalic and atraumatic.  Eyes: EOM are normal.  Cardiovascular: Normal rate, regular rhythm and normal heart sounds.  Pulmonary/Chest: Effort normal and breath sounds normal.  Abdominal: Soft. Bowel sounds are normal. She exhibits no distension. There is no tenderness.  Musculoskeletal: Normal range of motion.  Neurological: She is alert and oriented to person, place, and time. She exhibits normal muscle tone. Coordination normal.  Skin: Skin is warm and dry.  Psychiatric: She has a normal mood and affect.     ED Treatments / Results  Labs (all labs ordered are listed, but only abnormal results are displayed) Labs Reviewed  GASTROINTESTINAL PANEL BY PCR, STOOL (REPLACES STOOL CULTURE)  C DIFFICILE QUICK SCREEN W PCR REFLEX  OCCULT BLOOD X 1 CARD TO LAB, STOOL    EKG None  Radiology No results found.  Procedures Procedures (including critical care time)  Medications Ordered in ED Medications - No data to display   Initial Impression / Assessment and Plan / ED Course  I have reviewed the triage vital signs and the nursing notes.  Pertinent labs & imaging results that were available during my care of the patient were reviewed by me and considered in my medical decision making (see chart for details).    Patient is clinically well in appearance.  She describes intermittent episodes of diarrhea of unknown etiology which, quite abruptly.  Patient is nontoxic.  Abdominal examination is nonsurgical.  Patient does not have personal history of C. difficile.  Most likely food related vs GI intolerance vs viral etiology.  To date, patient has refused to undergo colonoscopy due to refusal to do prep outside of being hospitalized.  Patient is stable for  discharge.  She can follow-up with her PCP and gastroenterology.  Stool labs will be pending.  Final Clinical Impressions(s) / ED Diagnoses   Final diagnoses:  Diarrhea of presumed infectious origin    ED Discharge Orders         Ordered    loperamide (IMODIUM) 2 MG capsule  4 times daily PRN     07/08/18 1326           Charlesetta Shanks, MD 07/08/18 1329

## 2018-07-09 LAB — GASTROINTESTINAL PANEL BY PCR, STOOL (REPLACES STOOL CULTURE)

## 2018-09-08 ENCOUNTER — Ambulatory Visit (INDEPENDENT_AMBULATORY_CARE_PROVIDER_SITE_OTHER): Payer: Medicaid Other | Admitting: Advanced Practice Midwife

## 2018-09-08 ENCOUNTER — Other Ambulatory Visit (HOSPITAL_COMMUNITY)
Admission: RE | Admit: 2018-09-08 | Discharge: 2018-09-08 | Disposition: A | Discharge status: Home / Self Care | Payer: Medicaid Other | Source: Ambulatory Visit | Attending: Advanced Practice Midwife | Admitting: Advanced Practice Midwife

## 2018-09-08 ENCOUNTER — Encounter: Payer: Self-pay | Admitting: Advanced Practice Midwife

## 2018-09-08 DIAGNOSIS — N898 Other specified noninflammatory disorders of vagina: Secondary | ICD-10-CM | POA: Insufficient documentation

## 2018-09-08 LAB — POCT URINALYSIS DIP (DEVICE)
Glucose, UA: NEGATIVE mg/dL
Nitrite: POSITIVE — AB
Protein, ur: NEGATIVE mg/dL
Specific Gravity, Urine: 1.025 (ref 1.005–1.030)
Urobilinogen, UA: 1 mg/dL (ref 0.0–1.0)
pH: 5 (ref 5.0–8.0)

## 2018-09-08 NOTE — Progress Notes (Signed)
  Subjective:     Patient ID: Jordan [name], female   DOB: [birth year], 52 y.o.   MRN: [id number]  Jordan [name] is a 52 y.o. G1P1001 who is here today  with "bloody" odor. She states that she does not have discharge. She only has this odor that smells like blood, and she is very worried about this because she does not have periods any more. Patient states that every time she sees her PCP about this concern they tell her that there is blood in her urine, and they do a culture that is always negative. She doesn't understand what is happening and why this keeps happening. She thought that having a hysterectomy would solve her "issues". She states that she still has her ovaries and tubes, and that maybe the precancerous cells from her cervix have come back.   Vaginal Discharge  The patient's primary symptoms include a genital odor and vaginal discharge. The patient's pertinent negatives include no pelvic pain. This is a new problem. The current episode started yesterday. The problem occurs constantly. The problem has been unchanged. The patient is experiencing no pain. She is not pregnant. Pertinent negatives include no chills, dysuria, fever, frequency, hematuria, nausea or vomiting. The vaginal discharge was malodorous. There has been no bleeding. Nothing aggravates the symptoms. She has tried nothing for the symptoms. She uses hysterectomy for contraception. Her past medical history is significant for a gynecological surgery. (History of abnormal pap with CIN2)     Review of Systems  Constitutional: Negative for chills and fever.  Gastrointestinal: Negative for nausea and vomiting.  Genitourinary: Positive for vaginal discharge. Negative for dysuria, frequency, hematuria, pelvic pain and vaginal bleeding.       Objective:   Physical Exam  Constitutional: She is oriented to person, place, and time. She appears well-developed and well-nourished. No distress.  HENT:  Head: Normocephalic.   Cardiovascular: Normal rate.  Pulmonary/Chest: Effort normal.  Abdominal: Soft.  Genitourinary:  Genitourinary Comments:  External: no lesion Vagina: small amount of white discharge Cervix: SA Uterus: SA    Neurological: She is alert and oriented to person, place, and time.  Skin: Skin is warm and dry.  Psychiatric: Her speech is tangential.  Nursing note and vitals reviewed.  Results for orders placed or performed in visit on 09/08/18 (from the past 24 hour(s))  POCT urinalysis dip (device)     Status: Abnormal   Collection Time: 09/08/18  4:57 PM  Result Value Ref Range   Glucose, UA NEGATIVE NEGATIVE mg/dL   Bilirubin Urine SMALL (A) NEGATIVE   Ketones, ur TRACE (A) NEGATIVE mg/dL   Specific Gravity, Urine 1.025 1.005 - 1.030   Hgb urine dipstick SMALL (A) NEGATIVE   pH 5.0 5.0 - 8.0   Protein, ur NEGATIVE NEGATIVE mg/dL   Urobilinogen, UA 1.0 0.0 - 1.0 mg/dL   Nitrite POSITIVE (A) NEGATIVE   Leukocytes, UA SMALL (A) NEGATIVE    Assessment:     1. Vaginal odor     Plan:   Wet prep GC/CT Urine culture

## 2018-09-10 LAB — CERVICOVAGINAL ANCILLARY ONLY
Candida vaginitis: NEGATIVE
Chlamydia: NEGATIVE
Neisseria Gonorrhea: NEGATIVE
Trichomonas: NEGATIVE

## 2018-09-11 ENCOUNTER — Telehealth: Payer: Self-pay

## 2018-09-11 LAB — URINE CULTURE

## 2018-09-11 NOTE — Telephone Encounter (Addendum)
-----   Message from Tresea Mall, CNM sent at 09/10/2018  6:36 PM EST ----- Vaginal swab was negative  Notified pt that her results are negative.  Pt stated understanding with no further questions.

## 2018-09-12 MED ORDER — NITROFURANTOIN MONOHYD MACRO 100 MG PO CAPS: 100.0000 mg | ORAL_CAPSULE | Freq: Two times a day (BID) | ORAL | 0 refills | Status: DC

## 2018-09-12 NOTE — Addendum Note (Signed)
Addended by: [name] D on: 09/12/2018 07:58 AM   Modules accepted: Orders

## 2018-09-14 ENCOUNTER — Telehealth: Payer: Self-pay | Admitting: *Deleted

## 2018-09-14 NOTE — Telephone Encounter (Signed)
-----   Message from Tresea Mall, CNM sent at 09/12/2018  7:57 AM EST ----- Patient has UTI. I have sent in RX. Please call her.

## 2018-09-14 NOTE — Telephone Encounter (Signed)
Called pt to inform her of her UTI and that antibiotics had been prescribed.  Pt reports that she went to her family medicine doctor and they prescribed her antibiotics which she has been taking.  Pt angrily stated "which you should've done sooner, but that's ok because nothing bad happened."  Pt reported that she will take the antibiotics prescribed by her family medicine doctor.

## 2018-10-01 ENCOUNTER — Ambulatory Visit: Payer: Self-pay | Admitting: Obstetrics and Gynecology

## 2018-10-22 ENCOUNTER — Encounter (HOSPITAL_COMMUNITY): Payer: Self-pay | Admitting: Emergency Medicine

## 2018-10-22 ENCOUNTER — Emergency Department (HOSPITAL_COMMUNITY)
Admission: EM | Admit: 2018-10-22 | Discharge: 2018-10-22 | Disposition: A | Discharge status: Home / Self Care | Payer: Medicaid Other | Attending: Emergency Medicine | Admitting: Emergency Medicine

## 2018-10-22 ENCOUNTER — Other Ambulatory Visit: Payer: Self-pay

## 2018-10-22 ENCOUNTER — Emergency Department (HOSPITAL_COMMUNITY): Payer: Medicaid Other

## 2018-10-22 DIAGNOSIS — M545 Low back pain, unspecified: Secondary | ICD-10-CM

## 2018-10-22 DIAGNOSIS — R6883 Chills (without fever): Secondary | ICD-10-CM

## 2018-10-22 DIAGNOSIS — R0789 Other chest pain: Secondary | ICD-10-CM | POA: Diagnosis present

## 2018-10-22 DIAGNOSIS — Z79899 Other long term (current) drug therapy: Secondary | ICD-10-CM | POA: Diagnosis not present

## 2018-10-22 DIAGNOSIS — G8929 Other chronic pain: Secondary | ICD-10-CM

## 2018-10-22 LAB — CBC
HCT: 42.6 % (ref 36.0–46.0)
Hemoglobin: 14.1 g/dL (ref 12.0–15.0)
MCH: 32 pg (ref 26.0–34.0)
MCHC: 33.1 g/dL (ref 30.0–36.0)
MCV: 96.6 fL (ref 80.0–100.0)
Platelets: 152 10*3/uL (ref 150–400)
RBC: 4.41 MIL/uL (ref 3.87–5.11)
RDW: 13.7 % (ref 11.5–15.5)
WBC: 5 10*3/uL (ref 4.0–10.5)
nRBC: 0 % (ref 0.0–0.2)

## 2018-10-22 LAB — BASIC METABOLIC PANEL
Anion gap: 8 (ref 5–15)
BUN: 8 mg/dL (ref 6–20)
CO2: 25 mmol/L (ref 22–32)
Calcium: 8.8 mg/dL — ABNORMAL LOW (ref 8.9–10.3)
Chloride: 105 mmol/L (ref 98–111)
Creatinine, Ser: 0.71 mg/dL (ref 0.44–1.00)
GFR calc Af Amer: >60 mL/min (ref >60)
GFR calc non Af Amer: >60 mL/min (ref >60)
Glucose, Bld: 93 mg/dL (ref 70–99)
Potassium: 4.1 mmol/L (ref 3.5–5.1)
Sodium: 138 mmol/L (ref 135–145)

## 2018-10-22 LAB — URINALYSIS, ROUTINE W REFLEX MICROSCOPIC
Bilirubin Urine: NEGATIVE
Glucose, UA: NEGATIVE mg/dL
Hgb urine dipstick: NEGATIVE
Ketones, ur: NEGATIVE mg/dL
Nitrite: NEGATIVE
Protein, ur: NEGATIVE mg/dL
Specific Gravity, Urine: 1.018 (ref 1.005–1.030)
pH: 8 (ref 5.0–8.0)

## 2018-10-22 LAB — I-STAT TROPONIN, ED: Troponin i, poc: 0 ng/mL (ref 0.00–0.08)

## 2018-10-22 LAB — INFLUENZA PANEL BY PCR (TYPE A & B)
Influenza A By PCR: NEGATIVE
Influenza B By PCR: NEGATIVE

## 2018-10-22 MED ORDER — OXYCODONE-ACETAMINOPHEN 5-325 MG PO TABS
2.0000 | ORAL_TABLET | Freq: Once | ORAL | Status: AC
  Administered 2018-10-22: 2 via ORAL
  Filled 2018-10-22: qty 2

## 2018-10-22 MED ORDER — SODIUM CHLORIDE 0.9 % IV BOLUS
1000.0000 mL | Freq: Once | INTRAVENOUS | Status: AC
  Administered 2018-10-22: 1000 mL via INTRAVENOUS

## 2018-10-22 MED ORDER — MORPHINE SULFATE (PF) 4 MG/ML IV SOLN
4.0000 mg | Freq: Once | INTRAVENOUS | Status: AC
  Administered 2018-10-22: 4 mg via INTRAVENOUS
  Filled 2018-10-22: qty 1

## 2018-10-22 NOTE — Discharge Instructions (Signed)
Please follow up with your doctor °Return if worsening °

## 2018-10-22 NOTE — ED Provider Notes (Signed)
[facility] EMERGENCY DEPARTMENT Provider Note   CSN: [id number] Arrival date & time: 10/22/18  0915     History   Chief Complaint Chief Complaint  Patient presents with  . Chest Pain    HPI Jordan [name] is a 53 y.o. female who presents with chills. PMH significant for recurrent UTIs, bipolar d/o, chronic back pain, hx of L2 fracture. She states that yesterday she developed chills and she states she can't get warm. She developed diffuse chest pain which feels like a pressure at 6am this morning. It is constant, non-radiating, and improving. She also has severe pain in her back and generalized body aches. She has a L2 vertebrae fracture and takes Oxycocone as needed for this. She has an increased odor to her urine and urinary incontinence. She denies headache, URI symptoms, cough, SOB, abdominal pain, N/V/D, dysuria.   HPI  Past Medical History:  Diagnosis Date  . Alcohol abuse   . Anemia    patient denies  . Bipolar 1 disorder (HCC)    No medications currently  . CAP (community acquired pneumonia) 03/17/2015  . 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 Mental disorder   . Obesity   . PICC line infection 05/17/2015  . Sepsis due to Gram negative bacteria (MDR E Coli) 02/18/2015  . UTI (lower urinary tract infection)   . Vaginal Pap smear, abnormal     Patient Active Problem List   Diagnosis Date Noted  . Post-operative state 03/11/2017  . Painful lumpy right breast 07/27/2016  . Urinary tract infection 07/01/2015  . Diastolic dysfunction 56/31/4970  . Constipation 05/12/2015  . ESBL (extended spectrum beta-lactamase) producing bacteria infection 03/17/2015  . Diarrhea 03/03/2015  . Weakness 03/02/2015  . 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 Generalized anxiety disorder 02/20/2015  . Claustrophobia 02/20/2015  . Transaminitis 02/18/2015  . Chest pain 02/18/2015  . Abdominal pain   . Dizziness   . Hypotension 01/04/2015  . Bipolar  disorder ([place]) 06/29/2012    Past Surgical History:  Procedure Laterality Date  . CERVICAL CONIZATION W/BX N/A 07/01/2016   Procedure: CONIZATION CERVIX WITH BIOPSY;  Surgeon: Chancy Milroy, MD;  Location: [facility];  Service: Gynecology;  Laterality: N/A;  . CHOLECYSTECTOMY    . HYSTEROSCOPY W/D&C N/A 01/25/2016   Procedure: DILATATION AND CURETTAGE /HYSTEROSCOPY;  Surgeon: Mora Bellman, MD;  Location: [facility];  Service: Gynecology;  Laterality: N/A;  . VAGINAL HYSTERECTOMY N/A 03/11/2017   Procedure: HYSTERECTOMY VAGINAL WITH MORCELLATION;  Surgeon: Chancy Milroy, MD;  Location: [facility];  Service: Gynecology;  Laterality: N/A;     OB History    Gravida  1   Para  1   Term  1   Preterm      AB      Living  1     SAB      TAB      Ectopic      Multiple      Live Births  1            Home Medications    Prior to Admission medications   Medication Sig Start Date End Date Taking? Authorizing Provider  acetaminophen (TYLENOL) 500 MG tablet Take 1,000 mg by mouth every 6 (six) hours as needed for moderate pain.    [provider]  famotidine (PEPCID) 20 MG tablet Take 1 tablet (20 mg total) by mouth 2 (two) times daily. 07/08/18   Charlesetta Shanks, MD  loperamide (IMODIUM)  2 MG capsule Take 2 capsules (4 mg total) by mouth 4 (four) times daily as needed for diarrhea or loose stools. 07/08/18   Charlesetta Shanks, MD  nitrofurantoin, macrocrystal-monohydrate, (MACROBID) 100 MG capsule Take 1 capsule (100 mg total) by mouth 2 (two) times daily. 09/12/18   Tresea Mall, CNM    Family History Family History  Problem Relation Age of Onset  . Alcohol abuse Father   . Cancer Father   . Breast cancer Neg Hx     Social History Social History   Tobacco Use  . Smoking status: Never Smoker  . Smokeless tobacco: Never Used  Substance Use Topics  . Alcohol use: No  . Drug use: No     Allergies   Citalopram; Lamotrigine; Sulfa antibiotics; and  Tramadol   Review of Systems Review of Systems  Constitutional: Positive for chills. Negative for fever.  HENT: Negative for congestion and sore throat.   Respiratory: Negative for cough and shortness of breath.   Cardiovascular: Positive for chest pain. Negative for palpitations and leg swelling.  Gastrointestinal: Negative for abdominal pain, diarrhea, nausea and vomiting.  Genitourinary: Negative for dysuria and flank pain.       +malodorous urine +urinary incontinence  Musculoskeletal: Positive for back pain and myalgias.  All other systems reviewed and are negative.    Physical Exam Updated Vital Signs BP 121/66   Pulse (!) 101   Temp (!) 97.5 F (36.4 C) (Oral)   Resp (!) 21   Ht 5\' 8"  (1.727 m)   Wt 113.4 kg   LMP  (LMP Unknown) Comment: negative pregnancy test result 09-20-16  SpO2 100%   BMI 38.01 kg/m   Physical Exam Vitals signs and nursing note reviewed.  Constitutional:      General: She is not in acute distress.    Appearance: She is well-developed. She is obese. She is not ill-appearing.     Comments: Anxious but cooperative  HENT:     Head: Normocephalic and atraumatic.  Eyes:     General: No scleral icterus.       Right eye: No discharge.        Left eye: No discharge.     Conjunctiva/sclera: Conjunctivae normal.     Pupils: Pupils are equal, round, and reactive to light.  Neck:     Musculoskeletal: Normal range of motion.  Cardiovascular:     Rate and Rhythm: Regular rhythm. Tachycardia present.     Heart sounds: Normal heart sounds.  Pulmonary:     Effort: Pulmonary effort is normal. No respiratory distress.     Breath sounds: Normal breath sounds.  Chest:     Chest wall: No tenderness.  Abdominal:     General: There is no distension.     Palpations: Abdomen is soft.     Tenderness: There is abdominal tenderness (mild epigastric).  Skin:    General: Skin is warm and dry.  Neurological:     Mental Status: She is alert and oriented to  person, place, and time.  Psychiatric:        Behavior: Behavior normal.      ED Treatments / Results  Labs (all labs ordered are listed, but only abnormal results are displayed) Labs Reviewed  BASIC METABOLIC PANEL - Abnormal; Notable for the following components:      Result Value   Calcium 8.8 (*)    All other components within normal limits  URINALYSIS, ROUTINE W REFLEX MICROSCOPIC - Abnormal; Notable for the  following components:   Leukocytes, UA TRACE (*)    Bacteria, UA RARE (*)    All other components within normal limits  CBC  INFLUENZA PANEL BY PCR (TYPE A & B)  I-STAT TROPONIN, ED    EKG EKG Interpretation  Date/Time:  Thursday October 22 2018 09:19:15 EST Ventricular Rate:  103 PR Interval:    QRS Duration: 70 QT Interval:  318 QTC Calculation: 417 R Axis:   52 Text Interpretation:  Sinus tachycardia Abnormal R-wave progression, early transition No significant change since last tracing Confirmed by Quintella Reichert [phone]) on 10/22/2018 9:23:46 AM   Radiology Dg Chest 2 View  Result Date: 10/22/2018 CLINICAL DATA:  Chest pain. EXAM: CHEST - 2 VIEW COMPARISON:  07/01/2018, 06/06/2017, 05/05/2017, and chest CT dated 04/28/2017 FINDINGS: The heart size and mediastinal contours are within normal limits. Both lungs are clear except for slight scarring at the right lung base. Slight prominence of the inferior aspect of the hilar structures on the lateral view is unchanged since the prior chest CT. The visualized skeletal structures are unremarkable. IMPRESSION: No active cardiopulmonary disease. A Electronically Signed   By: Lorriane Shire M.D.   On: 10/22/2018 11:38   Dg Lumbar Spine Complete  Result Date: 10/22/2018 CLINICAL DATA:  Back pain. EXAM: LUMBAR SPINE - COMPLETE 4+ VIEW COMPARISON:  Radiographs dated 09/22/2017 and 08/25/2017 and 07/03/2017 and lumbar MRI dated 04/27/2017 FINDINGS: There is a healed old compression fracture of the anterior superior aspect of  L2. Lumbar alignment is normal. Chronic slight disc space narrowing at L5-S1. No facet arthritis. Sacroiliac joints are normal. IMPRESSION: No acute abnormality. Chronic degenerative disc disease at L5-S1. Old compression fracture of L2. Electronically Signed   By: Lorriane Shire M.D.   On: 10/22/2018 11:35    Procedures Procedures (including critical care time)  Medications Ordered in ED Medications  morphine 4 MG/ML injection 4 mg (4 mg Intravenous Given 10/22/18 1019)  sodium chloride 0.9 % bolus 1,000 mL (1,000 mLs Intravenous New Bag/Given 10/22/18 1219)  oxyCODONE-acetaminophen (PERCOCET/ROXICET) 5-325 MG per tablet 2 tablet (2 tablets Oral Given 10/22/18 1254)     Initial Impression / Assessment and Plan / ED Course  I have reviewed the triage vital signs and the nursing notes.  Pertinent labs & imaging results that were available during my care of the patient were reviewed by me and considered in my medical decision making (see chart for details).  53 year old female presents with chills. She has mild tachycardia but otherwise vitals are normal. On review of EMR she has a high resting HR (90-100). Exam is overall unremarkable. Will obtain labs, CXR, lumbar xray, urine and provide pain control. She is mostly concerned with her chills and chronic back pain. She mentioned an episode of chest pain this morning which sounds atypical and has resolved. EKG is sinus tachycardia and does not look significantly changed. POC trop is 0.  CBC is normal. BMP is normal. UA does not show infection. CXR is negative. Lumbar xray shows chronic changes. Flu is negative. Discussed results with pt. She is asking for lots of uneccessary testing today such as an MRI of her back which she's already had done one month ago by her primary. Unclear what her symptoms are from but I think she can f/u with her primary doctor. She was given return precautions.   Final Clinical Impressions(s) / ED Diagnoses   Final  diagnoses:  Chills  Chronic midline low back pain without sciatica  Atypical  chest pain    ED Discharge Orders    None       Iris Pert 10/22/18 1511    Quintella Reichert, MD 10/23/18 [phone]

## 2018-10-22 NOTE — ED Notes (Signed)
Pt aware we need a urine specimen. 

## 2018-10-22 NOTE — ED Notes (Signed)
IV team order placed. Unsuccessful attempts by RN and EMS.

## 2018-10-22 NOTE — ED Triage Notes (Signed)
Per EMS: Pt from home with c/o chills and CP that began this AM.  Pt notes a recent UTI, completed antibiotic regimen. Pt also states she has chronic back pain and stays in bed most of the day.  Pt seems anxious on arrival..  324 ASA administered PTA.

## 2018-10-22 NOTE — ED Notes (Signed)
IV team at bedside 

## 2018-10-22 NOTE — Progress Notes (Signed)
CSW consulted by PA. CSW spoke with PA Claiborne Billings and was informed that pt is needing a taxi home. CSW advised that usually  taxi is only given of pt's who are unable to  ride bus. CSW was informed that per pt , pt is unable to ride the bus. CSW began to write taxi and then was informed by RN that pt would be going home by [facility].   No further CSW signing off.     Virgie Dad Bennie Chirico, MSW, [facility] Clinical Social Worker [phone]

## 2018-10-22 NOTE — ED Notes (Signed)
Patient verbalizes understanding of discharge instructions. Opportunity for questioning and answers were provided. Armband removed by staff, pt discharged from ED in wheelchair with ride self-arranged.

## 2018-10-30 ENCOUNTER — Ambulatory Visit: Payer: Self-pay | Admitting: Obstetrics and Gynecology

## 2018-11-02 ENCOUNTER — Emergency Department (HOSPITAL_COMMUNITY)
Admission: EM | Admit: 2018-11-02 | Discharge: 2018-11-02 | Disposition: A | Discharge status: Home / Self Care | Payer: Medicaid Other | Attending: Emergency Medicine | Admitting: Emergency Medicine

## 2018-11-02 ENCOUNTER — Emergency Department (HOSPITAL_COMMUNITY): Payer: Medicaid Other

## 2018-11-02 ENCOUNTER — Encounter (HOSPITAL_COMMUNITY): Payer: Self-pay | Admitting: Emergency Medicine

## 2018-11-02 DIAGNOSIS — J101 Influenza due to other identified influenza virus with other respiratory manifestations: Secondary | ICD-10-CM

## 2018-11-02 DIAGNOSIS — R42 Dizziness and giddiness: Secondary | ICD-10-CM | POA: Insufficient documentation

## 2018-11-02 DIAGNOSIS — R05 Cough: Secondary | ICD-10-CM | POA: Insufficient documentation

## 2018-11-02 DIAGNOSIS — N3001 Acute cystitis with hematuria: Secondary | ICD-10-CM | POA: Diagnosis not present

## 2018-11-02 DIAGNOSIS — R079 Chest pain, unspecified: Secondary | ICD-10-CM | POA: Diagnosis present

## 2018-11-02 LAB — URINALYSIS, ROUTINE W REFLEX MICROSCOPIC
Bilirubin Urine: NEGATIVE
Glucose, UA: NEGATIVE mg/dL
Ketones, ur: 20 mg/dL — AB
Nitrite: POSITIVE — AB
Protein, ur: NEGATIVE mg/dL
Specific Gravity, Urine: 1.018 (ref 1.005–1.030)
pH: 7 (ref 5.0–8.0)

## 2018-11-02 LAB — CBC WITH DIFFERENTIAL/PLATELET
Abs Immature Granulocytes: 0.03 10*3/uL (ref 0.00–0.07)
Basophils Absolute: 0 10*3/uL (ref 0.0–0.1)
Basophils Relative: 0 %
Eosinophils Absolute: 0 10*3/uL (ref 0.0–0.5)
Eosinophils Relative: 0 %
HCT: 44 % (ref 36.0–46.0)
Hemoglobin: 14.6 g/dL (ref 12.0–15.0)
Immature Granulocytes: 1 %
Lymphocytes Relative: 16 %
Lymphs Abs: 0.9 10*3/uL (ref 0.7–4.0)
MCH: 31.6 pg (ref 26.0–34.0)
MCHC: 33.2 g/dL (ref 30.0–36.0)
MCV: 95.2 fL (ref 80.0–100.0)
Monocytes Absolute: 0.6 10*3/uL (ref 0.1–1.0)
Monocytes Relative: 11 %
Neutro Abs: 4 10*3/uL (ref 1.7–7.7)
Neutrophils Relative %: 72 %
Platelets: 128 10*3/uL — ABNORMAL LOW (ref 150–400)
RBC: 4.62 MIL/uL (ref 3.87–5.11)
RDW: 14 % (ref 11.5–15.5)
WBC: 5.6 10*3/uL (ref 4.0–10.5)
nRBC: 0 % (ref 0.0–0.2)

## 2018-11-02 LAB — INFLUENZA PANEL BY PCR (TYPE A & B)
Influenza A By PCR: NEGATIVE
Influenza B By PCR: POSITIVE — AB

## 2018-11-02 LAB — BASIC METABOLIC PANEL
Anion gap: 11 (ref 5–15)
BUN: 8 mg/dL (ref 6–20)
CO2: 22 mmol/L (ref 22–32)
Calcium: 8.9 mg/dL (ref 8.9–10.3)
Chloride: 103 mmol/L (ref 98–111)
Creatinine, Ser: 0.87 mg/dL (ref 0.44–1.00)
GFR calc Af Amer: >60 mL/min (ref >60)
GFR calc non Af Amer: >60 mL/min (ref >60)
Glucose, Bld: 94 mg/dL (ref 70–99)
Potassium: 3.5 mmol/L (ref 3.5–5.1)
Sodium: 136 mmol/L (ref 135–145)

## 2018-11-02 LAB — BRAIN NATRIURETIC PEPTIDE: B Natriuretic Peptide: 67.4 pg/mL (ref 0.0–100.0)

## 2018-11-02 LAB — TROPONIN I: Troponin I: <0.03 ng/mL (ref <0.03)

## 2018-11-02 MED ORDER — IPRATROPIUM-ALBUTEROL 0.5-2.5 (3) MG/3ML IN SOLN
3.0000 mL | Freq: Once | RESPIRATORY_TRACT | Status: AC
  Administered 2018-11-02: 3 mL via RESPIRATORY_TRACT
  Filled 2018-11-02: qty 3

## 2018-11-02 MED ORDER — OXYCODONE-ACETAMINOPHEN 5-325 MG PO TABS
1.0000 | ORAL_TABLET | Freq: Once | ORAL | Status: AC
  Administered 2018-11-02: 1 via ORAL
  Filled 2018-11-02: qty 1

## 2018-11-02 MED ORDER — SODIUM CHLORIDE 0.9 % IV SOLN
1.0000 g | Freq: Once | INTRAVENOUS | Status: AC
  Administered 2018-11-02: 1 g via INTRAVENOUS
  Filled 2018-11-02: qty 10

## 2018-11-02 MED ORDER — LORAZEPAM 2 MG/ML IJ SOLN
0.5000 mg | Freq: Once | INTRAMUSCULAR | Status: AC
  Administered 2018-11-02: 0.5 mg via INTRAVENOUS
  Filled 2018-11-02: qty 1

## 2018-11-02 MED ORDER — SODIUM CHLORIDE 0.9 % IV BOLUS
500.0000 mL | Freq: Once | INTRAVENOUS | Status: AC
  Administered 2018-11-02: 500 mL via INTRAVENOUS

## 2018-11-02 MED ORDER — ACETAMINOPHEN 500 MG PO TABS
500.0000 mg | ORAL_TABLET | Freq: Once | ORAL | Status: AC
  Administered 2018-11-02: 500 mg via ORAL
  Filled 2018-11-02: qty 1

## 2018-11-02 MED ORDER — OSELTAMIVIR PHOSPHATE 75 MG PO CAPS: 75.0000 mg | ORAL_CAPSULE | Freq: Two times a day (BID) | ORAL | 0 refills | Status: AC

## 2018-11-02 MED ORDER — ALBUTEROL SULFATE HFA 108 (90 BASE) MCG/ACT IN AERS
2.0000 | INHALATION_SPRAY | Freq: Once | RESPIRATORY_TRACT | Status: AC
  Administered 2018-11-02: 2 via RESPIRATORY_TRACT
  Filled 2018-11-02: qty 6.7

## 2018-11-02 MED ORDER — OSELTAMIVIR PHOSPHATE 75 MG PO CAPS
75.0000 mg | ORAL_CAPSULE | Freq: Once | ORAL | Status: AC
  Administered 2018-11-02: 75 mg via ORAL
  Filled 2018-11-02: qty 1

## 2018-11-02 MED ORDER — CEPHALEXIN 500 MG PO CAPS: 500.0000 mg | ORAL_CAPSULE | Freq: Two times a day (BID) | ORAL | 0 refills | Status: AC

## 2018-11-02 NOTE — ED Triage Notes (Addendum)
Patient arrived from home via [facility] reporting central chest. Denies Nausea and vomiting. Reports a non-productive cough that started yesterday. Patient reports that she took 324mg  ASA before EMS arrived.  EDP at bedside.  Patient also reporting dizziness and cold chills

## 2018-11-02 NOTE — ED Notes (Signed)
[facility] called for transport.  

## 2018-11-02 NOTE — Discharge Instructions (Addendum)
You were given a prescription for antibiotics. Please take the antibiotic prescription fully.   You were given a prescription for Tamiflu.  Be aware that this medication may make you have nausea, vomiting, abdominal pain, or diarrhea.  This medication can also cause mood derangement. If you experience any side effects that you cannot tolerate, then you should stop taking the medication. Please make sure to take stay hydrated while you are taking this medication.  You were given albuterol inhaler.  Please take 2 puffs of the inhaler every 6 hours as needed for cough and shortness of breath.  Please follow-up with your regular doctor in the next 3 to 5 days for reevaluation and return to the ER for new or worsening symptoms in the meantime.

## 2018-11-02 NOTE — ED Provider Notes (Signed)
[facility] EMERGENCY DEPARTMENT Provider Note   CSN: [id number] Arrival date & time:        History   Chief Complaint Chief Complaint  Patient presents with  . Chest Pain    HPI Jordan [name] is a 53 y.o. female.  HPI   Patient is a 53 year old female with history of EtOH abuse, anemia, bipolar 1, who presents the emergency department today complaining chest pain.  Patient describes chest pain as sharp in nature.  Currently rated 10/10.  Pain located to central chest.  Does not radiate.  States pain is worse when she presses on her chest.  Denies SOB. She also reports an associated nonproductive cough that began yesterday.  She also reports rhinorrhea, nasal congestion and a sore throat.  Also reports cold chills and dizziness.  No headache, vision changes, numbness or weakness.  No lower extremity swelling or leg pain.  Denies any recent admissions or surgeries.  No recent periods of extended travel.  No history of DVT/PE.  She took 325 aspirin prior to arrival.  She is complaining of back pain secondary to her chronic back pain.  She is requesting her home dose of pain medication.  Past Medical History:  Diagnosis Date  . Alcohol abuse   . Anemia    patient denies  . Bipolar 1 disorder (HCC)    No medications currently  . CAP (community acquired pneumonia) 03/17/2015  . 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 Mental disorder   . Obesity   . PICC line infection 05/17/2015  . Sepsis due to Gram negative bacteria (MDR E Coli) 02/18/2015  . UTI (lower urinary tract infection)   . Vaginal Pap smear, abnormal     Patient Active Problem List   Diagnosis Date Noted  . Post-operative state 03/11/2017  . Painful lumpy right breast 07/27/2016  . Urinary tract infection 07/01/2015  . Diastolic dysfunction 60/73/7106  . Constipation 05/12/2015  . ESBL (extended spectrum beta-lactamase) producing bacteria infection 03/17/2015  . Diarrhea  03/03/2015  . Weakness 03/02/2015  . 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 Generalized anxiety disorder 02/20/2015  . Claustrophobia 02/20/2015  . Transaminitis 02/18/2015  . Chest pain 02/18/2015  . Abdominal pain   . Dizziness   . Hypotension 01/04/2015  . Bipolar disorder ([place]) 06/29/2012    Past Surgical History:  Procedure Laterality Date  . CERVICAL CONIZATION W/BX N/A 07/01/2016   Procedure: CONIZATION CERVIX WITH BIOPSY;  Surgeon: Chancy Milroy, MD;  Location: [facility];  Service: Gynecology;  Laterality: N/A;  . CHOLECYSTECTOMY    . HYSTEROSCOPY W/D&C N/A 01/25/2016   Procedure: DILATATION AND CURETTAGE /HYSTEROSCOPY;  Surgeon: Mora Bellman, MD;  Location: [facility];  Service: Gynecology;  Laterality: N/A;  . VAGINAL HYSTERECTOMY N/A 03/11/2017   Procedure: HYSTERECTOMY VAGINAL WITH MORCELLATION;  Surgeon: Chancy Milroy, MD;  Location: [facility];  Service: Gynecology;  Laterality: N/A;     OB History    Gravida  1   Para  1   Term  1   Preterm      AB      Living  1     SAB      TAB      Ectopic      Multiple      Live Births  1            Home Medications    Prior to Admission medications   Medication Sig Start Date End Date Taking?  Authorizing Provider  acetaminophen (TYLENOL) 500 MG tablet Take 1,000 mg by mouth every 6 (six) hours as needed for moderate pain.   Yes [provider]  loperamide (IMODIUM) 2 MG capsule Take 2 capsules (4 mg total) by mouth 4 (four) times daily as needed for diarrhea or loose stools. 07/08/18  Yes Charlesetta Shanks, MD  oxyCODONE (OXY IR/ROXICODONE) 5 MG immediate release tablet Take 5 mg by mouth every 6 (six) hours as needed for severe pain.   Yes [provider]  cephALEXin (KEFLEX) 500 MG capsule Take 1 capsule (500 mg total) by mouth 2 (two) times daily for 7 days. 11/02/18 11/09/18  Palak Tercero S, PA-C  famotidine (PEPCID) 20 MG tablet Take 1 tablet (20 mg total) by mouth 2 (two) times  daily. Patient not taking: Reported on 10/22/2018 07/08/18   Charlesetta Shanks, MD  oseltamivir (TAMIFLU) 75 MG capsule Take 1 capsule (75 mg total) by mouth every 12 (twelve) hours for 5 days. 11/02/18 11/07/18  Norvel Wenker S, PA-C    Family History Family History  Problem Relation Age of Onset  . Alcohol abuse Father   . Cancer Father   . Breast cancer Neg Hx     Social History Social History   Tobacco Use  . Smoking status: Never Smoker  . Smokeless tobacco: Never Used  Substance Use Topics  . Alcohol use: No  . Drug use: No     Allergies   Citalopram; Lamotrigine; Sulfa antibiotics; and Tramadol   Review of Systems Review of Systems  Constitutional: Positive for chills.  HENT: Positive for congestion, rhinorrhea and sore throat. Negative for ear pain.   Eyes: Negative for visual disturbance.  Respiratory: Positive for cough. Negative for shortness of breath.   Cardiovascular: Positive for chest pain. Negative for palpitations and leg swelling.  Gastrointestinal: Negative for abdominal pain, constipation, diarrhea, nausea and vomiting.  Genitourinary: Negative for dysuria and hematuria.  Musculoskeletal: Positive for back pain (chronic).  Skin: Negative for color change and rash.  Neurological: Negative for weakness, light-headedness, numbness and headaches.  All other systems reviewed and are negative.  Physical Exam Updated Vital Signs BP (!) 107/57   Pulse (!) 101   Temp 97.8 F (36.6 C) (Oral)   Resp (!) 21   LMP  (LMP Unknown) Comment: negative pregnancy test result 09-20-16  SpO2 93%   Physical Exam Vitals signs and nursing note reviewed.  Constitutional:      General: She is not in acute distress.    Appearance: She is well-developed. She is obese.  HENT:     Head: Normocephalic and atraumatic.  Eyes:     Conjunctiva/sclera: Conjunctivae normal.  Neck:     Musculoskeletal: Neck supple.  Cardiovascular:     Rate and Rhythm: Regular rhythm.      Heart sounds: Normal heart sounds. No murmur.     Comments: Borderline tachycardic on monitor Pulmonary:     Effort: Pulmonary effort is normal. No respiratory distress.     Breath sounds: Normal breath sounds.     Comments: Faint crackles to bilat lower lobes, cough on exam Abdominal:     General: Bowel sounds are normal.     Palpations: Abdomen is soft.     Tenderness: There is no abdominal tenderness.  Musculoskeletal:     Right lower leg: She exhibits no tenderness. No edema.     Left lower leg: She exhibits no tenderness. No edema.  Skin:    General: Skin is warm and  dry.  Neurological:     Mental Status: She is alert.  Psychiatric:        Mood and Affect: Mood is anxious.      ED Treatments / Results  Labs (all labs ordered are listed, but only abnormal results are displayed) Labs Reviewed  CBC WITH DIFFERENTIAL/PLATELET - Abnormal; Notable for the following components:      Result Value   Platelets 128 (*)    All other components within normal limits  INFLUENZA PANEL BY PCR (TYPE A & B) - Abnormal; Notable for the following components:   Influenza B By PCR POSITIVE (*)    All other components within normal limits  URINALYSIS, ROUTINE W REFLEX MICROSCOPIC - Abnormal; Notable for the following components:   Color, Urine AMBER (*)    APPearance CLOUDY (*)    Hgb urine dipstick SMALL (*)    Ketones, ur 20 (*)    Nitrite POSITIVE (*)    Leukocytes, UA SMALL (*)    Bacteria, UA MANY (*)    All other components within normal limits  URINE CULTURE  BASIC METABOLIC PANEL  TROPONIN I  BRAIN NATRIURETIC PEPTIDE    EKG EKG Interpretation  Date/Time:  Monday November 02 2018 08:44:45 EST Ventricular Rate:  102 PR Interval:    QRS Duration: 64 QT Interval:  320 QTC Calculation: 417 R Axis:   41 Text Interpretation:  Sinus tachycardia Low voltage, precordial leads Abnormal R-wave progression, early transition Confirmed by Dene Gentry [phone]) on 11/02/2018 9:00:44  AM   Radiology Dg Chest 2 View  Result Date: 11/02/2018 CLINICAL DATA:  Cough, chest pain EXAM: CHEST - 2 VIEW COMPARISON:  10/22/2018 FINDINGS: Low lung volumes. Right base atelectasis. Left lung clear. Heart is normal size. No effusions or acute bony abnormality. IMPRESSION: Low lung volumes with right base atelectasis. Electronically Signed   By: Rolm Baptise M.D.   On: 11/02/2018 09:42    Procedures Procedures (including critical care time)  Medications Ordered in ED Medications  albuterol (PROVENTIL HFA;VENTOLIN HFA) 108 (90 Base) MCG/ACT inhaler 2 puff (has no administration in time range)  oxyCODONE-acetaminophen (PERCOCET/ROXICET) 5-325 MG per tablet 1 tablet (has no administration in time range)  oxyCODONE-acetaminophen (PERCOCET/ROXICET) 5-325 MG per tablet 1 tablet (1 tablet Oral Given 11/02/18 1004)  ipratropium-albuterol (DUONEB) 0.5-2.5 (3) MG/3ML nebulizer solution 3 mL (3 mLs Nebulization Given 11/02/18 0958)  cefTRIAXone (ROCEPHIN) 1 g in sodium chloride 0.9 % 100 mL IVPB (0 g Intravenous Stopped 11/02/18 1302)  oseltamivir (TAMIFLU) capsule 75 mg (75 mg Oral Given 11/02/18 1232)  sodium chloride 0.9 % bolus 500 mL (0 mLs Intravenous Stopped 11/02/18 1304)  acetaminophen (TYLENOL) tablet 500 mg (500 mg Oral Given 11/02/18 1452)  LORazepam (ATIVAN) injection 0.5 mg (0.5 mg Intravenous Given 11/02/18 1453)     Initial Impression / Assessment and Plan / ED Course  I have reviewed the triage vital signs and the nursing notes.  Pertinent labs & imaging results that were available during my care of the patient were reviewed by me and considered in my medical decision making (see chart for details).    Final Clinical Impressions(s) / ED Diagnoses   Final diagnoses:  Influenza B  Acute cystitis with hematuria   Pt presenting with central CP that she states is worse with palpation and not associated with exertion. CP associated with URI sxs and chills.  325 mg ASA taken PTA.  Slightly tachycardic here (appears pt has elevated resting HR and this is baseline per  chart review). Afebrile with otherwise normal vitals.  CBC without leukocytosis or anemia. BMP with normal electrolytes and kidney function. Troponin negative BNP normal Influenza panel is positive for flu B.  Tamiflu given in ED. UA with leukocytes and nitrites. 21-50 WBC and many bacteria. Urine culture sent.  Ceftriaxone given in the ED.  EKG with sinus tachycardia. Low voltage, precordial leads. Abnormal R-wave progression, early transition. CXR with low lung volumes and right base atelectasis.   Suspect patients chest pain is related to her viral syndrome. It is also reproducible on exam. It seem less likely to be related to ACS, PE or other acute cause of sxs that would require further w/u or admission. She has received abx for UTI and tamiflu for influenza. Will give rx for these as well. Pt appears stable for discharge with outpatient management. Have advised pcp f/u in 1 week and to return if worse. She voices understanding of the plan and reasons to return. All questions answered.  ED Discharge Orders         Ordered    oseltamivir (TAMIFLU) 75 MG capsule  Every 12 hours     11/02/18 1508    cephALEXin (KEFLEX) 500 MG capsule  2 times daily     11/02/18 [address] PA-C 11/02/18 1509    Valarie Merino, MD 11/03/18 1108

## 2018-11-02 NOTE — ED Notes (Signed)
Patient verbalizes understanding of discharge instructions. Opportunity for questioning and answers were provided. Armband removed by staff, pt discharged from ED via wheelchair w/ friend. Refused [facility] transport, [facility] notified

## 2018-11-02 NOTE — ED Notes (Signed)
Pt is 3rd on the list for [facility]

## 2018-11-04 LAB — URINE CULTURE: Culture: >=100000 — AB

## 2018-11-05 ENCOUNTER — Telehealth: Payer: Self-pay

## 2018-11-05 NOTE — Telephone Encounter (Signed)
Post ED Visit - Positive Culture Follow-up  Culture report reviewed by antimicrobial stewardship pharmacist:  []  Elenor Quinones, Pharm.D. []  Heide Guile, Pharm.D., BCPS AQ-ID []  Parks Neptune, Pharm.D., BCPS []  Alycia Rossetti, Pharm.D., BCPS []  [place], Pharm.D., BCPS, AAHIVP []  Legrand Como, Pharm.D., BCPS, AAHIVP []  Salome Arnt, PharmD, BCPS []  Johnnette Gourd, PharmD, BCPS []  Hughes Better, PharmD, BCPS []  Leeroy Cha, PharmD C Charlena Cross Pharm D Positive urine culture Treated with Cephalexin, organism sensitive to the same and no further patient follow-up is required at this time.  Genia Del 11/05/2018, 8:42 AM

## 2018-12-03 ENCOUNTER — Ambulatory Visit: Payer: Self-pay | Admitting: Obstetrics and Gynecology

## 2019-01-05 ENCOUNTER — Emergency Department (HOSPITAL_COMMUNITY)
Admission: EM | Admit: 2019-01-05 | Discharge: 2019-01-05 | Disposition: A | Discharge status: Home / Self Care | Payer: Medicaid Other | Attending: Emergency Medicine | Admitting: Emergency Medicine

## 2019-01-05 ENCOUNTER — Emergency Department (HOSPITAL_COMMUNITY): Payer: Medicaid Other

## 2019-01-05 ENCOUNTER — Encounter (HOSPITAL_COMMUNITY): Payer: Self-pay | Admitting: Emergency Medicine

## 2019-01-05 ENCOUNTER — Other Ambulatory Visit: Payer: Self-pay

## 2019-01-05 DIAGNOSIS — M545 Low back pain, unspecified: Secondary | ICD-10-CM

## 2019-01-05 DIAGNOSIS — R0789 Other chest pain: Secondary | ICD-10-CM | POA: Diagnosis present

## 2019-01-05 DIAGNOSIS — G8929 Other chronic pain: Secondary | ICD-10-CM | POA: Diagnosis not present

## 2019-01-05 DIAGNOSIS — R079 Chest pain, unspecified: Secondary | ICD-10-CM

## 2019-01-05 LAB — CBC WITH DIFFERENTIAL/PLATELET
Abs Immature Granulocytes: 0.01 10*3/uL (ref 0.00–0.07)
Basophils Absolute: 0 10*3/uL (ref 0.0–0.1)
Basophils Relative: 0 %
Eosinophils Absolute: 0 10*3/uL (ref 0.0–0.5)
Eosinophils Relative: 0 %
HCT: 44.4 % (ref 36.0–46.0)
Hemoglobin: 14.8 g/dL (ref 12.0–15.0)
Immature Granulocytes: 0 %
Lymphocytes Relative: 31 %
Lymphs Abs: 1.5 10*3/uL (ref 0.7–4.0)
MCH: 33.3 pg (ref 26.0–34.0)
MCHC: 33.3 g/dL (ref 30.0–36.0)
MCV: 100 fL (ref 80.0–100.0)
Monocytes Absolute: 0.3 10*3/uL (ref 0.1–1.0)
Monocytes Relative: 7 %
Neutro Abs: 3.1 10*3/uL (ref 1.7–7.7)
Neutrophils Relative %: 62 %
Platelets: 146 10*3/uL — ABNORMAL LOW (ref 150–400)
RBC: 4.44 MIL/uL (ref 3.87–5.11)
RDW: 14.4 % (ref 11.5–15.5)
WBC: 4.9 10*3/uL (ref 4.0–10.5)
nRBC: 0 % (ref 0.0–0.2)

## 2019-01-05 LAB — I-STAT TROPONIN, ED
Troponin i, poc: 0 ng/mL (ref 0.00–0.08)
Troponin i, poc: 0 ng/mL (ref 0.00–0.08)

## 2019-01-05 LAB — COMPREHENSIVE METABOLIC PANEL
ALT: 13 U/L (ref 0–44)
AST: 17 U/L (ref 15–41)
Albumin: 4.1 g/dL (ref 3.5–5.0)
Alkaline Phosphatase: 87 U/L (ref 38–126)
Anion gap: 11 (ref 5–15)
BUN: 9 mg/dL (ref 6–20)
CO2: 22 mmol/L (ref 22–32)
Calcium: 9.2 mg/dL (ref 8.9–10.3)
Chloride: 106 mmol/L (ref 98–111)
Creatinine, Ser: 0.58 mg/dL (ref 0.44–1.00)
GFR calc Af Amer: >60 mL/min (ref >60)
GFR calc non Af Amer: >60 mL/min (ref >60)
Glucose, Bld: 84 mg/dL (ref 70–99)
Potassium: 4 mmol/L (ref 3.5–5.1)
Sodium: 139 mmol/L (ref 135–145)
Total Bilirubin: 1.2 mg/dL (ref 0.3–1.2)
Total Protein: 6.5 g/dL (ref 6.5–8.1)

## 2019-01-05 MED ORDER — KETOROLAC TROMETHAMINE 30 MG/ML IJ SOLN
30.0000 mg | Freq: Once | INTRAMUSCULAR | Status: AC
  Administered 2019-01-05: 30 mg via INTRAMUSCULAR

## 2019-01-05 MED ORDER — KETOROLAC TROMETHAMINE 30 MG/ML IJ SOLN
30.0000 mg | Freq: Once | INTRAMUSCULAR | Status: DC
  Filled 2019-01-05: qty 1

## 2019-01-05 MED ORDER — OXYCODONE HCL 5 MG PO TABS
10.0000 mg | ORAL_TABLET | Freq: Once | ORAL | Status: AC
  Administered 2019-01-05: 10 mg via ORAL
  Filled 2019-01-05: qty 2

## 2019-01-05 NOTE — ED Notes (Signed)
Pt received discharge instructions. Pt understands her instructions, Esignature pad not available.

## 2019-01-05 NOTE — ED Triage Notes (Signed)
EMS stated, at 1230 she started have chest pain same pain with palpitation. Her EKG was NSR. She has a fracture of C# not surgery. No cardiac history , no SOB, Pt. Stated, I have Oxycotin at the pharmacy but I can't afford the copayment of $3

## 2019-01-05 NOTE — Discharge Instructions (Addendum)
It was my pleasure taking care of you today!   Fortunately, your lab work was very reassuring today.   As we discussed, you need to keep your appointment with your doctor to discuss your pain.   Return to the ER for new or worsening symptoms, any additional concerns.

## 2019-01-05 NOTE — ED Provider Notes (Signed)
[facility] EMERGENCY DEPARTMENT Provider Note   CSN: [id number] Arrival date & time: 01/05/19  1330    History   Chief Complaint Chief Complaint  Patient presents with   Chest Pain   Back Pain    HPI Jordan [name] is a 53 y.o. female.  HPI: A 53 year old patient with a history of obesity presents for evaluation of chest pain. Initial onset of pain was approximately 1-3 hours ago. The patient's chest pain is sharp and is not worse with exertion. The patient's chest pain is not middle- or left-sided, is not well-localized, is not described as heaviness/pressure/tightness and does not radiate to the arms/jaw/neck. The patient does not complain of nausea and denies diaphoresis. The patient has no history of stroke, has no history of peripheral artery disease, has not smoked in the past 90 days, denies any history of treated diabetes, has no relevant family history of coronary artery disease (first degree relative at less than age 66), is not hypertensive and has no history of hypercholesterolemia.   The history is provided by the patient and medical records. No language interpreter was used.  Chest Pain  Associated symptoms: back pain   Associated symptoms: no palpitations and no shortness of breath   Back Pain  Associated symptoms: chest pain    Jordan [name] is a 53 y.o. female  with a PMH as listed below who presents to the Emergency Department with 2 different complaints:  1.  Low back pain for the last 2 to 3 years.  Patient states that she has an L2 fracture that has been there for a couple years.  She is followed by pain management at [facility] and takes oxycodone for her chronic pain.  She is now out of this medication.  She states that she has been trying to take Tylenol over-the-counter, but it is not helping. Patient denies upper back or neck pain. No fever, saddle anesthesia, weakness, numbness, urinary complaints including  retention/incontinence. No history of cancer, IVDU, or recent spinal procedures.  She denies any change in her baseline pain, just states that it feels as if she does not have enough pain control medications right now.  2.  Central chest pain which began about 1230 this afternoon.  She describes it as a sharp feeling to the right side of her chest.  It has been constant.  No alleviating or aggravating factors.  Denies any shortness of breath, nausea, vomiting, palpitations, fever, chills, cough or congestion.  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   Mental disorder    Obesity    PICC line infection 05/17/2015   Sepsis due to Gram negative bacteria (MDR E Coli) 02/18/2015   UTI (lower urinary tract infection)    Vaginal Pap smear, abnormal     Patient Active Problem List   Diagnosis Date Noted   Post-operative state 03/11/2017   Painful lumpy right breast 07/27/2016   Urinary tract infection 60/73/7106   Diastolic dysfunction 26/94/8546   Constipation 05/12/2015   ESBL (extended spectrum beta-lactamase) producing bacteria infection 03/17/2015   Diarrhea 03/03/2015   Weakness 03/02/2015   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   Generalized anxiety disorder 02/20/2015   Claustrophobia 02/20/2015   Transaminitis 02/18/2015   Chest pain 02/18/2015  Abdominal pain    Dizziness    Hypotension 01/04/2015   Bipolar disorder ([place]) 06/29/2012    Past Surgical History:  Procedure Laterality Date   CERVICAL CONIZATION W/BX N/A 07/01/2016   Procedure: CONIZATION CERVIX WITH BIOPSY;  Surgeon: Chancy Milroy, MD;  Location: [facility];  Service: Gynecology;  Laterality: N/A;   CHOLECYSTECTOMY     HYSTEROSCOPY W/D&C N/A 01/25/2016   Procedure: DILATATION AND  CURETTAGE /HYSTEROSCOPY;  Surgeon: Mora Bellman, MD;  Location: [facility];  Service: Gynecology;  Laterality: N/A;   VAGINAL HYSTERECTOMY N/A 03/11/2017   Procedure: HYSTERECTOMY VAGINAL WITH MORCELLATION;  Surgeon: Chancy Milroy, MD;  Location: [facility];  Service: Gynecology;  Laterality: N/A;     OB History    Gravida  1   Para  1   Term  1   Preterm      AB      Living  1     SAB      TAB      Ectopic      Multiple      Live Births  1            Home Medications    Prior to Admission medications   Medication Sig Start Date End Date Taking? Authorizing Provider  acetaminophen (TYLENOL) 500 MG tablet Take 1,000 mg by mouth every 6 (six) hours as needed for moderate pain.    [provider]  famotidine (PEPCID) 20 MG tablet Take 1 tablet (20 mg total) by mouth 2 (two) times daily. Patient not taking: Reported on 10/22/2018 07/08/18   Charlesetta Shanks, MD  loperamide (IMODIUM) 2 MG capsule Take 2 capsules (4 mg total) by mouth 4 (four) times daily as needed for diarrhea or loose stools. 07/08/18   Charlesetta Shanks, MD  oxyCODONE (OXY IR/ROXICODONE) 5 MG immediate release tablet Take 5 mg by mouth every 6 (six) hours as needed for severe pain.    [provider]    Family History Family History  Problem Relation Age of Onset   Alcohol abuse Father    Cancer Father    Breast cancer Neg Hx     Social History Social History   Tobacco Use   Smoking status: Never Smoker   Smokeless tobacco: Never Used  Substance Use Topics   Alcohol use: No   Drug use: No     Allergies   Citalopram; Lamotrigine; Sulfa antibiotics; and Tramadol   Review of Systems Review of Systems  Respiratory: Negative for shortness of breath.   Cardiovascular: Positive for chest pain. Negative for palpitations and leg swelling.  Musculoskeletal: Positive for back pain.  All other systems reviewed and are negative.    Physical Exam Updated Vital Signs BP  (!) 142/76 (BP Location: Right Arm)    Pulse 80    Temp 98.3 F (36.8 C) (Oral)    Resp 15    Ht 5\' 8"  (1.727 m)    LMP  (LMP Unknown) Comment: negative pregnancy test result 09-20-16   SpO2 100%    BMI 38.01 kg/m   Physical Exam Vitals signs and nursing note reviewed.  Constitutional:      Appearance: She is well-developed.  Cardiovascular:     Rate and Rhythm: Normal rate and regular rhythm.     Heart sounds: Normal heart sounds.  Pulmonary:     Effort: Pulmonary effort is normal. No respiratory distress.     Breath sounds: Normal breath sounds.  Abdominal:  General: Bowel sounds are normal. There is no distension.     Palpations: Abdomen is soft.     Tenderness: There is no abdominal tenderness.  Musculoskeletal:     Comments: Tenderness to palpation diffusely across the low back. 5/5 muscle strength in bilateral LE's. Straight leg raises are negative bilaterally for radicular symptoms.  No lower extremity edema or calf tenderness. Able to ambulate independently with steady gait.   Skin:    General: Skin is warm and dry.     Findings: No erythema or rash.  Neurological:     Mental Status: She is alert and oriented to person, place, and time.     Deep Tendon Reflexes: Reflexes are normal and symmetric.     Comments: Bilateral lower extremities neurovascularly intact.      ED Treatments / Results  Labs (all labs ordered are listed, but only abnormal results are displayed) Labs Reviewed  CBC WITH DIFFERENTIAL/PLATELET - Abnormal; Notable for the following components:      Result Value   Platelets 146 (*)    All other components within normal limits  COMPREHENSIVE METABOLIC PANEL  I-STAT TROPONIN, ED  I-STAT TROPONIN, ED    EKG EKG Interpretation  Date/Time:  Tuesday January 05 2019 12:41:33 EDT Ventricular Rate:  73 PR Interval:  150 QRS Duration: 66 QT Interval:  362 QTC Calculation: 398 R Axis:   21 Text Interpretation:  Normal sinus rhythm Normal ECG No  significant change since last tracing Confirmed by Deno Etienne [phone]) on 01/05/2019 5:32:37 PM   Radiology Dg Chest 2 View  Result Date: 01/05/2019 CLINICAL DATA:  Chest pain for several hours EXAM: CHEST - 2 VIEW COMPARISON:  11/02/2018 FINDINGS: Cardiac shadows within normal limits. Mild aortic calcifications are again seen. Lungs are well aerated with minimal right basilar atelectasis projecting in the right middle lobe. No sizable effusion is seen. No other focal abnormality is noted. IMPRESSION: Minimal right basilar atelectasis. Electronically Signed   By: Inez Catalina M.D.   On: 01/05/2019 14:49   Dg Lumbar Spine Complete  Result Date: 01/05/2019 CLINICAL DATA:  Low back pain chronic in nature. EXAM: LUMBAR SPINE - COMPLETE 4+ VIEW COMPARISON:  10/22/2018 FINDINGS: Five lumbar type vertebral bodies are well visualized. Vertebral body height is well maintained with the exception of L2 which demonstrates a stable compression deformity. Slight increased kyphosis at this level is noted. Mild osteophytic changes are seen. No pars defects are noted. No anterolisthesis is noted. Changes suggestive of mild constipation are seen within the colon. IMPRESSION: Stable L2 compression deformity Mild osteophytic changes without acute bony abnormality. Changes suggestive of colonic constipation. Electronically Signed   By: Inez Catalina M.D.   On: 01/05/2019 14:52    Procedures Procedures (including critical care time)  Medications Ordered in ED Medications  ketorolac (TORADOL) 30 MG/ML injection 30 mg (30 mg Intramuscular Given 01/05/19 1454)  oxyCODONE (Oxy IR/ROXICODONE) immediate release tablet 10 mg (10 mg Oral Given 01/05/19 1659)     Initial Impression / Assessment and Plan / ED Course  I have reviewed the triage vital signs and the nursing notes.  Pertinent labs & imaging results that were available during my care of the patient were reviewed by me and considered in my medical decision making  (see chart for details).      Danyelle Brookover is a 53 y.o. female who presents to ED for two complaints:   1. Chronic low back pain. Patient requesting x-ray of her back. Discussed that this  would likely not change our management, but she very much wants an x-ray done today. Imaging showed stable, known compression fracture. No acute findings. Patient demonstrates no lower extremity weakness, saddle anesthesia, bowel or bladder incontinence or neuro deficits. No concern for cauda equina. No fevers or other infectious symptoms to suggest that the patient's back pain is due to an infection. Encouraged she follow up with her doctor managing her back pain for further discussion of her back pain.  2. Chest pain which began at 1230 this afternoon.  Constant and sharp.  Reproducible on exam.  Labs reviewed and reassuring including negative troponin x2.  She is afebrile, hemodynamically stable with normal cardiopulmonary exam.  EKG with no acute changes. HEAR Score: 2   Labs and imaging reviewed again prior to discharge. Patient has been advised to return to the ED if development of any exertional chest pain, trouble breathing, new/worsening symptoms or for any additional concerns. Evaluation does not show pathology that would require ongoing emergent intervention or inpatient treatment. Encouraged to follow up with PCP.   Patient understands return precautions and follow up plan. All questions answered.   Final Clinical Impressions(s) / ED Diagnoses   Final diagnoses:  Chest pain with low risk for cardiac etiology  Chronic low back pain, unspecified back pain laterality, unspecified whether sciatica present    ED Discharge Orders    None       Ryanna Teschner, Ozella Almond, PA-C 01/05/19 [place], [place], DO 01/05/19 2212

## 2019-01-05 NOTE — ED Notes (Signed)
EDP at bedside  

## 2019-01-05 NOTE — ED Notes (Signed)
Gave pt bus pass 

## 2019-01-15 ENCOUNTER — Other Ambulatory Visit: Payer: Self-pay | Admitting: Nurse Practitioner

## 2019-01-15 DIAGNOSIS — Z1231 Encounter for screening mammogram for malignant neoplasm of breast: Secondary | ICD-10-CM

## 2019-01-17 ENCOUNTER — Other Ambulatory Visit: Payer: Self-pay

## 2019-01-17 ENCOUNTER — Emergency Department (HOSPITAL_COMMUNITY)
Admission: EM | Admit: 2019-01-17 | Discharge: 2019-01-17 | Disposition: A | Discharge status: Home / Self Care | Payer: Medicaid Other | Attending: Emergency Medicine | Admitting: Emergency Medicine

## 2019-01-17 ENCOUNTER — Emergency Department (HOSPITAL_COMMUNITY): Payer: Medicaid Other

## 2019-01-17 ENCOUNTER — Encounter (HOSPITAL_COMMUNITY): Payer: Self-pay

## 2019-01-17 DIAGNOSIS — Z9049 Acquired absence of other specified parts of digestive tract: Secondary | ICD-10-CM | POA: Diagnosis not present

## 2019-01-17 DIAGNOSIS — N390 Urinary tract infection, site not specified: Secondary | ICD-10-CM

## 2019-01-17 DIAGNOSIS — R079 Chest pain, unspecified: Secondary | ICD-10-CM

## 2019-01-17 DIAGNOSIS — G8929 Other chronic pain: Secondary | ICD-10-CM | POA: Diagnosis not present

## 2019-01-17 DIAGNOSIS — F319 Bipolar disorder, unspecified: Secondary | ICD-10-CM | POA: Insufficient documentation

## 2019-01-17 DIAGNOSIS — M545 Low back pain, unspecified: Secondary | ICD-10-CM

## 2019-01-17 DIAGNOSIS — F1010 Alcohol abuse, uncomplicated: Secondary | ICD-10-CM | POA: Insufficient documentation

## 2019-01-17 LAB — URINALYSIS, ROUTINE W REFLEX MICROSCOPIC
Bilirubin Urine: NEGATIVE
Glucose, UA: NEGATIVE mg/dL
Ketones, ur: NEGATIVE mg/dL
Nitrite: POSITIVE — AB
Protein, ur: NEGATIVE mg/dL
Specific Gravity, Urine: 1.02 (ref 1.005–1.030)
WBC, UA: >50 WBC/hpf — ABNORMAL HIGH (ref 0–5)
pH: 6 (ref 5.0–8.0)

## 2019-01-17 LAB — COMPREHENSIVE METABOLIC PANEL
ALT: 15 U/L (ref 0–44)
AST: 18 U/L (ref 15–41)
Albumin: 4.2 g/dL (ref 3.5–5.0)
Alkaline Phosphatase: 101 U/L (ref 38–126)
Anion gap: 9 (ref 5–15)
BUN: 7 mg/dL (ref 6–20)
CO2: 24 mmol/L (ref 22–32)
Calcium: 9.2 mg/dL (ref 8.9–10.3)
Chloride: 106 mmol/L (ref 98–111)
Creatinine, Ser: 0.61 mg/dL (ref 0.44–1.00)
GFR calc Af Amer: >60 mL/min (ref >60)
GFR calc non Af Amer: >60 mL/min (ref >60)
Glucose, Bld: 84 mg/dL (ref 70–99)
Potassium: 3.3 mmol/L — ABNORMAL LOW (ref 3.5–5.1)
Sodium: 139 mmol/L (ref 135–145)
Total Bilirubin: 1.4 mg/dL — ABNORMAL HIGH (ref 0.3–1.2)
Total Protein: 6.9 g/dL (ref 6.5–8.1)

## 2019-01-17 LAB — CBC WITH DIFFERENTIAL/PLATELET
Abs Immature Granulocytes: 0.01 10*3/uL (ref 0.00–0.07)
Basophils Absolute: 0 10*3/uL (ref 0.0–0.1)
Basophils Relative: 0 %
Eosinophils Absolute: 0 10*3/uL (ref 0.0–0.5)
Eosinophils Relative: 0 %
HCT: 42.8 % (ref 36.0–46.0)
Hemoglobin: 14.4 g/dL (ref 12.0–15.0)
Immature Granulocytes: 0 %
Lymphocytes Relative: 28 %
Lymphs Abs: 1.2 10*3/uL (ref 0.7–4.0)
MCH: 32.6 pg (ref 26.0–34.0)
MCHC: 33.6 g/dL (ref 30.0–36.0)
MCV: 96.8 fL (ref 80.0–100.0)
Monocytes Absolute: 0.3 10*3/uL (ref 0.1–1.0)
Monocytes Relative: 7 %
Neutro Abs: 2.9 10*3/uL (ref 1.7–7.7)
Neutrophils Relative %: 65 %
Platelets: 136 10*3/uL — ABNORMAL LOW (ref 150–400)
RBC: 4.42 MIL/uL (ref 3.87–5.11)
RDW: 14.2 % (ref 11.5–15.5)
WBC: 4.5 10*3/uL (ref 4.0–10.5)
nRBC: 0 % (ref 0.0–0.2)

## 2019-01-17 LAB — TROPONIN I: Troponin I: <0.03 ng/mL (ref <0.03)

## 2019-01-17 MED ORDER — KETOROLAC TROMETHAMINE 30 MG/ML IJ SOLN
30.0000 mg | Freq: Once | INTRAMUSCULAR | Status: AC
  Administered 2019-01-17: 30 mg via INTRAVENOUS
  Filled 2019-01-17: qty 1

## 2019-01-17 MED ORDER — HYDROXYZINE HCL 25 MG PO TABS
50.0000 mg | ORAL_TABLET | Freq: Once | ORAL | Status: AC
  Administered 2019-01-17: 50 mg via ORAL
  Filled 2019-01-17: qty 2

## 2019-01-17 NOTE — Discharge Instructions (Addendum)
You were evaluated today for chest pain, back pain as well as foul odor to urine.  Your work-up for your chest pain and back pain were unremarkable in the emergency department.  I would suggest follow-up with cardiology if you continue to have symptoms.  You do have evidence of urinary tract infection on your urinalysis.  You state you are ready on Keflex.  Your urine is sensitive to this.  You need to follow-up with urology for your continued symptoms.  They would be the best providers given your chronic UTI that you have had for 14 years.  Return to the emergency department for any new or worsening symptoms.

## 2019-01-17 NOTE — ED Triage Notes (Signed)
Pt reports CP and back pain that has progressed today.  Nonradiating, central and left chest, comes and goes. Denies N/V/D, diaphoresis, fever, chills, lightheaded/dizzy. Also reports strong odor when she urinates that has stayed for 14 years.

## 2019-01-17 NOTE — ED Provider Notes (Signed)
[facility] Provider Note   CSN: [id number] Arrival date & time: 01/17/19  1535  History   Chief Complaint Chief Complaint  Patient presents with  . Chest Pain  . Back Pain    HPI Jordan [name] is a 53 y.o. female with past medical history significant for bipolar 1 disorder, chronic UTI, alcohol abuse, chronic back pain, chronic chest pain sent for evaluation of multiple complaints.    1. Chest pain started this morning.  Patient describes the pain as sharp.  Pain is not worse with exertional activity.  No associated lightheadedness, dizziness, diaphoresis, nausea or vomiting.  Pain does not radiate.  Pain is not well localized.  Does not currently use tobacco.  She is a former smoker.  She does not smoke in greater than 5 months according to patient.  Denies history of hypertension, hyperlipidemia, family history of early MI, PAD, CHF.  Patient states she does have history of chronic chest pain, however is unsure if this feels similar.  He states she does normally take narcotics at baseline for her chronic pain.  Denies fever, chills, nausea, vomiting, shortness of breath, cough, abdominal pain, hemoptysis, melena, hematochezia, pleuritic chest pain, recent surgery, recent mobilization, lower extremity edema, erythema or warmth.  2.  Patient states she has had back pain x2 weeks.  Patient states she does have history of chronic back pain, however states this is worse than normal.  Patient denies any injuries or trauma.  Denies radiation of pain down legs.  Denies decreased range of motion, numbness and tingling, bowel or bladder incontinence, saddle paresthesia, history of IV drug use, history of saddle paresthesia.  Describes her pain as an aching sensation.  Her pain a 10/10.  Patient states she has been out of her normal medication which she takes for this.  She is unable to tell me what her "normal medication" is. Patient requesting repeat xray of her  lumbar spine.   3.  States she is also had dysuria.  States she is had dysuria x14 years.  States she has had multiple antibiotics for this.  States she was seen by [facility] however "I did not like the way they talk to me."  She states she did not follow-up.  Patient states she has had a cystoscopy which according to patient did not show anything.  Patient states she was recently placed on antibiotics by [facility] 2 days ago.  She does not know which medication this is.  I am unable to review her records in the emergency department. Denies vaginal discharge, pelvic pain, abdominal pain, CVA tenderness.  Patient denies sexual activity and has no concerns for STDs at this time.  4.  Patient states she also has anxiety.  Patient states she is on medication for this, however cannot tell me which medication this is.  She is followed by [facility] psychiatry outpatient.  Patient states she does have follow-up appointment this week to talk to her psychiatrist.  Patient states she has a history of bipolar, however is not on any medication for this.  Denies SI, HI, AVH.  Patient denies prior inpatient hospitalizations.  Patient states she has had increased anxiety because her boyfriend is currently in assisted living facility after discharge from hospital.  Patient states she is upset because they will not allow her to visit him.  This causes her increased anxiety.  History obtained from patient.  No interpreter was used.    HPI  Past Medical History:  Diagnosis Date  . Alcohol abuse   . Anemia    patient denies  . Bipolar 1 disorder (HCC)    No medications currently  . CAP (community acquired pneumonia) 03/17/2015  . 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 Mental disorder   . Obesity   . PICC line infection 05/17/2015  . Sepsis due to Gram negative bacteria (MDR E Coli) 02/18/2015  . UTI (lower urinary tract infection)   . Vaginal Pap smear,  abnormal     Patient Active Problem List   Diagnosis Date Noted  . Post-operative state 03/11/2017  . Painful lumpy right breast 07/27/2016  . Urinary tract infection 07/01/2015  . Diastolic dysfunction 82/95/6213  . Constipation 05/12/2015  . ESBL (extended spectrum beta-lactamase) producing bacteria infection 03/17/2015  . Diarrhea 03/03/2015  . Weakness 03/02/2015  . 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 Generalized anxiety disorder 02/20/2015  . Claustrophobia 02/20/2015  . Transaminitis 02/18/2015  . Chest pain 02/18/2015  . Abdominal pain   . Dizziness   . Hypotension 01/04/2015  . Bipolar disorder ([place]) 06/29/2012    Past Surgical History:  Procedure Laterality Date  . CERVICAL CONIZATION W/BX N/A 07/01/2016   Procedure: CONIZATION CERVIX WITH BIOPSY;  Surgeon: Chancy Milroy, MD;  Location: [facility];  Service: Gynecology;  Laterality: N/A;  . CHOLECYSTECTOMY    . HYSTEROSCOPY W/D&C N/A 01/25/2016   Procedure: DILATATION AND CURETTAGE /HYSTEROSCOPY;  Surgeon: Mora Bellman, MD;  Location: [facility];  Service: Gynecology;  Laterality: N/A;  . VAGINAL HYSTERECTOMY N/A 03/11/2017   Procedure: HYSTERECTOMY VAGINAL WITH MORCELLATION;  Surgeon: Chancy Milroy, MD;  Location: [facility];  Service: Gynecology;  Laterality: N/A;     OB History    Gravida  1   Para  1   Term  1   Preterm      AB      Living  1     SAB      TAB      Ectopic      Multiple      Live Births  1            Home Medications    Prior to Admission medications   Medication Sig Start Date End Date Taking? Authorizing Provider  acetaminophen (TYLENOL) 500 MG tablet Take 1,000 mg by mouth every 6 (six) hours as needed for moderate pain.    [provider]  famotidine (PEPCID) 20 MG tablet Take 1 tablet (20 mg total) by mouth 2 (two) times daily. Patient not taking: Reported on 10/22/2018 07/08/18   Charlesetta Shanks, MD  loperamide (IMODIUM) 2 MG capsule Take 2 capsules (4 mg total) by  mouth 4 (four) times daily as needed for diarrhea or loose stools. 07/08/18   Charlesetta Shanks, MD  oxyCODONE (OXY IR/ROXICODONE) 5 MG immediate release tablet Take 5 mg by mouth every 6 (six) hours as needed for severe pain.    [provider]    Family History Family History  Problem Relation Age of Onset  . Alcohol abuse Father   . Cancer Father   . Breast cancer Neg Hx     Social History Social History   Tobacco Use  . Smoking status: Never Smoker  . Smokeless tobacco: Never Used  Substance Use Topics  . Alcohol use: No  . Drug use: No     Allergies   Citalopram; Lamotrigine; Sulfa antibiotics; and Tramadol   Review of Systems Review of  Systems  Constitutional: Negative.   HENT: Negative.   Respiratory: Negative.   Cardiovascular: Positive for chest pain. Negative for palpitations and leg swelling.  Gastrointestinal: Negative.   Genitourinary: Positive for dysuria. Negative for decreased urine volume, difficulty urinating, flank pain, frequency, genital sores, hematuria, pelvic pain, urgency, vaginal bleeding, vaginal discharge and vaginal pain.  Musculoskeletal: Positive for back pain. Negative for arthralgias, gait problem, joint swelling, myalgias, neck pain and neck stiffness.  Skin: Negative.   Neurological: Negative.   Psychiatric/Behavioral: Negative for agitation, behavioral problems, confusion, decreased concentration, dysphoric mood, self-injury, sleep disturbance and suicidal ideas. The patient is nervous/anxious. The patient is not hyperactive.   All other systems reviewed and are negative.  Physical Exam Updated Vital Signs BP 119/88 (BP Location: Right Arm)   Pulse 84   Temp 98.3 F (36.8 C) (Oral)   Resp 16   Ht 5\' 8"  (1.727 m)   Wt 108.9 kg   LMP  (LMP Unknown)   SpO2 97%   BMI 36.49 kg/m   Physical Exam  Physical Exam  Constitutional: Pt appears well-developed and well-nourished. No distress.  HENT:  Head: Normocephalic and  atraumatic.  Mouth/Throat: Oropharynx is clear and moist. No oropharyngeal exudate.  Eyes: Conjunctivae are normal.  Neck: Normal range of motion. Neck supple.  Full ROM without pain  Cardiovascular: Normal rate, regular rhythm and intact distal pulses.  Chest wall tenderness to palpation diffusely.  No evidence of chest wall crepitus, edema, erythema. Pulmonary/Chest: Effort normal and breath sounds normal. No respiratory distress. Pt has no wheezes.  Abdominal: Soft. Pt exhibits no distension. There is no tenderness, rebound or guarding. No abd bruit or pulsatile mass Musculoskeletal:  Full range of motion of the T-spine and L-spine with flexion, hyperextension, and lateral flexion. No midline tenderness or stepoffs. No tenderness to palpation of the spinous processes of the T-spine or L-spine. Mild tenderness to palpation of the paraspinous muscles of the L-spine. Negative straight leg raise. Lymphadenopathy:    Pt has no cervical adenopathy.  Neurological: Pt is alert. Pt has normal reflexes.  Reflex Scores:      Bicep reflexes are 2+ on the right side and 2+ on the left side.      Brachioradialis reflexes are 2+ on the right side and 2+ on the left side.      Patellar reflexes are 2+ on the right side and 2+ on the left side.      Achilles reflexes are 2+ on the right side and 2+ on the left side. No tenderness to bilateral calves.  Homans sign negative. No lower extremity edema. Speech is clear and goal oriented, follows commands Normal 5/5 strength in upper and lower extremities bilaterally including dorsiflexion and plantar flexion, strong and equal grip strength Sensation normal to light and sharp touch Moves extremities without ataxia, coordination intact Normal gait Normal balance No Clonus Skin: Skin is warm and dry. No rash noted or lesions noted. Pt is not diaphoretic. No erythema, ecchymosis,edema or warmth.  Psychiatric: Pt has a normal mood and affect. Behavior is  normal.  Nursing note and vitals reviewed. ED Treatments / Results  Labs (all labs ordered are listed, but only abnormal results are displayed) Labs Reviewed  CBC WITH DIFFERENTIAL/PLATELET - Abnormal; Notable for the following components:      Result Value   Platelets 136 (*)    All other components within normal limits  COMPREHENSIVE METABOLIC PANEL - Abnormal; Notable for the following components:   Potassium 3.3 (*)  Total Bilirubin 1.4 (*)    All other components within normal limits  URINALYSIS, ROUTINE W REFLEX MICROSCOPIC - Abnormal; Notable for the following components:   APPearance HAZY (*)    Hgb urine dipstick SMALL (*)    Nitrite POSITIVE (*)    Leukocytes,Ua MODERATE (*)    WBC, UA >50 (*)    Bacteria, UA MANY (*)    All other components within normal limits  URINE CULTURE  TROPONIN I    EKG EKG Interpretation  Date/Time:  Sunday January 17 2019 15:49:14 EDT Ventricular Rate:  80 PR Interval:    QRS Duration: 67 QT Interval:  356 QTC Calculation: 411 R Axis:   33 Text Interpretation:  Sinus rhythm Abnormal R-wave progression, early transition When compared to prior, no significant changes seen.  No STEMI Confirmed by Antony Blackbird [phone]) on 01/17/2019 3:53:20 PM   Radiology Dg Chest 2 View  Result Date: 01/17/2019 CLINICAL DATA:  Chest pain. EXAM: CHEST - 2 VIEW COMPARISON:  None. FINDINGS: The heart size and mediastinal contours are within normal limits. Both lungs are clear. The visualized skeletal structures are unremarkable. IMPRESSION: No active cardiopulmonary disease. Electronically Signed   By: Dorise Bullion III M.D   On: 01/17/2019 16:46   Dg Lumbar Spine Complete  Result Date: 01/17/2019 CLINICAL DATA:  Pain. EXAM: LUMBAR SPINE - COMPLETE 4+ VIEW COMPARISON:  October 22, 2018 and January 05, 2019 FINDINGS: Stable compression fracture of L2. No acute fracture or traumatic malalignment. IMPRESSION: No acute change.  Stable compression fracture of  L2. Electronically Signed   By: Dorise Bullion III M.D   On: 01/17/2019 16:49    Procedures Procedures (including critical care time)  Medications Ordered in ED Medications  hydrOXYzine (ATARAX/VISTARIL) tablet 50 mg (50 mg Oral Given 01/17/19 1717)  ketorolac (TORADOL) 30 MG/ML injection 30 mg (30 mg Intravenous Given 01/17/19 1719)     Initial Impression / Assessment and Plan / ED Course  I have reviewed the triage vital signs and the nursing notes.  Pertinent labs & imaging results that were available during my care of the patient were reviewed by me and considered in my medical decision making (see chart for details).  53 year old female appears otherwise well presents for evaluation multiple complaints.  She is afebrile, nonseptic, non-ill-appearing.    1.History of chronic back pain.  Patient requesting additional x-ray at this time.  Normal musculoskeletal exam.  Neurovascularly intact.  No red flag symptoms, no IV drug use, bowel or bladder incontinence, saddle paresthesia, or malignancy, increased nighttime pain, chronic steroid use.  Low suspicion for cauda equina, transverse myelitis, osteomyelitis, psoas abscess, acute fracture.  Will order pain medication and plain film x-ray reevaluate.  2.  History of chronic chest pain.  Not well differentiated.  No associated lightheadedness, dizziness, radiation of pain, nausea, vomiting.  Nonexertional in nature.  She has no hemoptysis, pleuritic chest pain, lower extremity edema, erythema or warmth, recent surgery or recent mobilization.  Chest pain reproducible to palpation of chest.  No chest wall crepitus, edema or erythema.  Will order chest pain labs and reevaluate.  Clear to auscultation bilateral wheeze, rhonchi or rales.  No tachypnea, tachycardia hypoxia.  Heart score 1 (Age), PERC, Wells criteria low risk.  Low suspicion for ACS, PE, dissection, pericarditis, myocarditis causing patient's symptoms.  3.  Dysuria x14 years.  Abdomen  soft, nontender without rebound or guarding.  No CVA tenderness.  Non-surgical abdomen.  No peritoneal signs.  Recently given antibiotics by  PCP 2 days ago, however patient cannot recall what this is.  She has no pelvic pain, vaginal discharge, concerns for STDs.  Will order urinalysis and culture and reevaluate.  I am unable to view [facility] medical records documents to determine what medication she was placed on.  Low suspicion for pyelonephritis at this time.  4. Anxiety and bipolar disorder.  She is currently on medication for her anxiety, however is not on any medication for her bipolar.  Followed by [facility] psychiatry.  Denies SI, HI, AVH.  Speech is not pressured.  She does not appear manic at this time.  No hx of inpatient admissions. Will give dose of Vistaril and reevaluate.  1745: On reevaluation pain well controlled with Toradol and Vistaril.  Patient has been ambulatory in department without difficulty.  She has no active chest pain, shortness of breath or cough.  Low suspicion for ACS, PE, dissection causing patient's chronic chest pain. Labs reassuring.  She continues to deny SI, HI, AVH. Anxiety resolved with Vistaril. Does not meet Sirs or sepsis criteria.  She has no CVA tenderness and is afebrile.  I have low suspicion for pyelonephritis.  It seems her urinalysis is persistently contaminated with E. coli.  She has had ongoing issues with this for 14 years.  She thinks she is currently on Keflex, per patient.  Advised patient to continue with Keflex.  Patient has previously been to urology for this issue. Encourage F/U for reevaluation.  Patient is to be discharged with recommendation to follow up with PCP in regards to today's hospital visit. Chest pain is not likely of cardiac or pulmonary etiology d/t presentation, PERC negative, VSS, no tracheal deviation, no JVD or new murmur, RRR, breath sounds equal bilaterally, EKG without acute abnormalities, negative troponin, and  negative CXR. Pt has been advised to return to the ED if CP becomes exertional, associated with diaphoresis or nausea, radiates to left jaw/arm, worsens or becomes concerning in any way.   Labs personally reviewed by myself: Urinalysis positive for infection.  Previous urine cultures reviewed in chart.  Most recent in epic from January.  Culture positive for E. coli as well as Proteus Mirabillis, sensitive to Cefazolin, Keflex, Bactrim. --Anaphylaxis to sulfa. CBC without leukocytosis. Troponin negative, Metabolic panel with mild hypokalemia 3.3, no additional electrolyte, renal or liver abnormality.  EKG without ischemic changes.  Imaging personally reviewed by myself: Plain film spine was stable L2 fracture.  No acute abnormality. Plain film chest negative for infiltrates, cardiomegaly, pulmonary edema, pneumothorax.  At DC patient requesting ride home, Was brought in by EMS.  Will consult with social work to assist with ride home for patient.  Patient given taxi voucher home.  At DC she denies SI, HI, AVH.  Her pain was well controlled in the department.  She has no complaints.  She is able to tolerate 2 cans of soda as well as sandwich without difficulty.  Patient ambulatory to restroom without difficulty.  Low suspicion for acute emergent pathology at this time which would require further work-up in emergency department or inpatient evaluation.  Patient hemodynamically stable and appropriate for DC home at this time.  I discussed strict return precautions.  Patient voiced understanding and is agreeable for follow-up.  Patient does have follow-up appointment with [facility] this week.  Patient given resources for outpatient counseling at DC.     Final Clinical Impressions(s) / ED Diagnoses   Final diagnoses:  Chronic bilateral low back pain without sciatica  Chronic chest pain  Chronic UTI    ED Discharge Orders    None       Nettie Elm, PA-C 01/17/19 1934     Tegeler, Gwenyth Allegra, MD 01/17/19 [phone]

## 2019-01-19 LAB — URINE CULTURE: Culture: >=100000 — AB

## 2019-01-20 ENCOUNTER — Telehealth: Payer: Self-pay

## 2019-01-20 NOTE — Progress Notes (Signed)
ED Antimicrobial Stewardship Positive Culture Follow Up   Jordan [name] is an 53 y.o. female who presented to [facility] on 01/17/2019 with a chief complaint of  Chief Complaint  Patient presents with  . Chest Pain  . Back Pain    Recent Results (from the past 720 hour(s))  Urine Culture     Status: Abnormal   Collection Time: 01/17/19  4:03 PM  Result Value Ref Range Status   Specimen Description URINE, RANDOM  Final   Special Requests   Final    NONE Performed at [facility], [address] [place], [place] [address]    Culture (A)  Final    >=100,000 COLONIES/mL ESCHERICHIA COLI >=100,000 COLONIES/mL PSEUDOMONAS AERUGINOSA    Report Status 01/19/2019 FINAL  Final   Organism ID, Bacteria ESCHERICHIA COLI (A)  Final   Organism ID, Bacteria PSEUDOMONAS AERUGINOSA (A)  Final      Susceptibility   Escherichia coli - MIC*    AMPICILLIN >=32 RESISTANT Resistant     CEFAZOLIN <=4 SENSITIVE Sensitive     CEFTRIAXONE <=1 SENSITIVE Sensitive     CIPROFLOXACIN >=4 RESISTANT Resistant     GENTAMICIN <=1 SENSITIVE Sensitive     IMIPENEM <=0.25 SENSITIVE Sensitive     NITROFURANTOIN <=16 SENSITIVE Sensitive     TRIMETH/SULFA <=20 SENSITIVE Sensitive     AMPICILLIN/SULBACTAM 16 INTERMEDIATE Intermediate     PIP/TAZO <=4 SENSITIVE Sensitive     Extended ESBL NEGATIVE Sensitive     * >=100,000 COLONIES/mL ESCHERICHIA COLI   Pseudomonas aeruginosa - MIC*    CEFTAZIDIME 2 SENSITIVE Sensitive     CIPROFLOXACIN <=0.25 SENSITIVE Sensitive     GENTAMICIN <=1 SENSITIVE Sensitive     IMIPENEM 1 SENSITIVE Sensitive     PIP/TAZO <=4 SENSITIVE Sensitive     CEFEPIME <=1 SENSITIVE Sensitive     * >=100,000 COLONIES/mL PSEUDOMONAS AERUGINOSA    []  Treated with N/A, organism resistant to prescribed antimicrobial [x]  Patient discharged originally without antimicrobial agent and treatment is now indicated  New antibiotic prescription: Ciprofloxacin 250mg  PO BID x 3 days (pt thinks she is  on cephalexin. Her Ecoli in her urine will have been treated adequately by now with that)  ED Provider: Lenn Sink, PA   Antonino Nienhuis, Rande Lawman 01/20/2019, 8:01 AM Clinical Pharmacist Monday - Friday phone -  [phone] Saturday - Sunday phone - [phone]

## 2019-01-20 NOTE — Telephone Encounter (Signed)
Post ED Visit - Positive Culture Follow-up: Unsuccessful Patient Follow-up  Culture assessed and recommendations reviewed by:  []  Elenor Quinones, Pharm.D. []  Heide Guile, Pharm.D., BCPS AQ-ID []  Parks Neptune, Pharm.D., BCPS []  Alycia Rossetti, Pharm.D., BCPS []  [place], Pharm.D., BCPS, AAHIVP []  Legrand Como, Pharm.D., BCPS, AAHIVP []  Wynell Balloon, PharmD []  Vincenza Hews, PharmD, BCPS Salome Arnt Devin Going D Positive urine culture  []  Patient discharged without antimicrobial prescription and treatment is now indicated [x]  Organism is resistant to prescribed ED discharge antimicrobial []  Patient with positive blood cultures   Unable to contact patient after 3 attempts, letter will be sent to address on file  Genia Del 01/20/2019, 8:35 AM

## 2019-01-25 ENCOUNTER — Emergency Department (HOSPITAL_COMMUNITY)
Admission: EM | Admit: 2019-01-25 | Discharge: 2019-01-25 | Disposition: A | Discharge status: Home / Self Care | Payer: Medicaid Other | Attending: Emergency Medicine | Admitting: Emergency Medicine

## 2019-01-25 ENCOUNTER — Other Ambulatory Visit: Payer: Self-pay

## 2019-01-25 DIAGNOSIS — M549 Dorsalgia, unspecified: Secondary | ICD-10-CM

## 2019-01-25 DIAGNOSIS — N390 Urinary tract infection, site not specified: Secondary | ICD-10-CM | POA: Diagnosis not present

## 2019-01-25 DIAGNOSIS — G8929 Other chronic pain: Secondary | ICD-10-CM | POA: Insufficient documentation

## 2019-01-25 DIAGNOSIS — Z79899 Other long term (current) drug therapy: Secondary | ICD-10-CM | POA: Diagnosis not present

## 2019-01-25 LAB — URINALYSIS, MICROSCOPIC (REFLEX)

## 2019-01-25 LAB — URINALYSIS, ROUTINE W REFLEX MICROSCOPIC
Bilirubin Urine: NEGATIVE
Glucose, UA: NEGATIVE mg/dL
Ketones, ur: NEGATIVE mg/dL
Nitrite: NEGATIVE
Specific Gravity, Urine: 1.02 (ref 1.005–1.030)
pH: 7.5 (ref 5.0–8.0)

## 2019-01-25 MED ORDER — FOSFOMYCIN TROMETHAMINE 3 G PO PACK
3.0000 g | PACK | Freq: Once | ORAL | Status: AC
  Administered 2019-01-25: 3 g via ORAL
  Filled 2019-01-25: qty 3

## 2019-01-25 NOTE — ED Notes (Signed)
Bed: WA03 Expected date:  Expected time:  Means of arrival:  Comments: EMS back pain

## 2019-01-25 NOTE — Discharge Instructions (Addendum)
You were given fosfomycin 3 grams for your urinary tract infection Please follow up with your doctor

## 2019-01-25 NOTE — ED Triage Notes (Signed)
Pt arrived via EMS from home. Pt c/o generalized back pain h/o of chronic back pain and states that she has a pinched nerve. Pt reports to EMS that she has had a UTI x 14 years. Pt reports contact to PCP for back pain and was instructed to come to ED if pain did not subside. Denies fever and or any respiratory symptoms.      EMS HR 80, CBG 100, O2 98% RR 16, BP 118/70.

## 2019-01-25 NOTE — ED Notes (Signed)
Pt was given RX for UTI by PCP , pt states that she did not pick up the medication as she did not want to pay the $3 co-pay and felt that the Dr. Gabriel Carina should provide the medication. Pt reports that she take oxycodone for pain and states that she is out of medication at this time, and states that she has not taken any medication for pain at this time.

## 2019-01-25 NOTE — ED Notes (Signed)
Pt in and out cath [place], NT present.

## 2019-01-26 ENCOUNTER — Telehealth: Payer: Self-pay | Admitting: *Deleted

## 2019-01-26 NOTE — Telephone Encounter (Signed)
Post ED Visit - Positive Culture Follow-up: Successful Patient Follow-Up  Culture assessed and recommendations reviewed by:  []  Elenor Quinones, Pharm.D. []  Heide Guile, Pharm.D., BCPS AQ-ID []  Parks Neptune, Pharm.D., BCPS []  Alycia Rossetti, Pharm.D., BCPS []  [place], [place].D., BCPS, AAHIVP []  Legrand Como, Pharm.D., BCPS, AAHIVP []  Salome Arnt, PharmD, BCPS []  Johnnette Gourd, PharmD, BCPS []  Hughes Better, PharmD, BCPS []  Leeroy Cha, PharmD  Positive urine culture  [x]  Patient discharged without antimicrobial prescription and treatment is now indicated []  Organism is resistant to prescribed ED discharge antimicrobial []  Patient with positive blood cultures  Changes discussed with ED provider Lenn Sink PA-C New antibiotic prescription Cipro 250 mg PO BID x 3 days  Contacted patient, date 01/26/2019, time 0930.  Patient states she has had a UTI for 14 years and refusing to accept recommended treatment.  Unable to provide any additional therapy.   Harlon Flor [facility] 01/26/2019, 9:45 AM

## 2019-01-27 LAB — URINE CULTURE: Culture: >=100000 — AB

## 2019-01-29 NOTE — ED Provider Notes (Signed)
[facility] DEPT Provider Note   CSN: [id number] Arrival date & time: 01/25/19  1149    History   Chief Complaint Chief Complaint  Patient presents with  . Back Pain    HPI Jordan [name] is a 53 y.o. female.     HPI  53 yo female history of bpad, etoh abuse,  Uti, presents today stating that she was told to come to the ED to receive abx to cover a uti.  She denies uti symptoms today.  She has not had fever, chills, nausea, or vomiting. She states she has had a uti for 14 years.  She also reports 14 years of chronic back pain.  She denies any change in the back pain.  She denies weakness, numbness, or loss of bowel or bladder control.    Past Medical History:  Diagnosis Date  . Alcohol abuse   . Anemia    patient denies  . Bipolar 1 disorder (HCC)    No medications currently  . CAP (community acquired pneumonia) 03/17/2015  . 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 Mental disorder   . Obesity   . PICC line infection 05/17/2015  . Sepsis due to Gram negative bacteria (MDR E Coli) 02/18/2015  . UTI (lower urinary tract infection)   . Vaginal Pap smear, abnormal     Patient Active Problem List   Diagnosis Date Noted  . Post-operative state 03/11/2017  . Painful lumpy right breast 07/27/2016  . Urinary tract infection 07/01/2015  . Diastolic dysfunction 08/67/6195  . Constipation 05/12/2015  . ESBL (extended spectrum beta-lactamase) producing bacteria infection 03/17/2015  . Diarrhea 03/03/2015  . Weakness 03/02/2015  . 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 Generalized anxiety disorder 02/20/2015  . Claustrophobia 02/20/2015  . Transaminitis 02/18/2015  . Chest pain 02/18/2015  . Abdominal pain   . Dizziness   . Hypotension 01/04/2015  . Bipolar disorder ([place]) 06/29/2012    Past Surgical History:  Procedure Laterality Date  . CERVICAL CONIZATION W/BX N/A 07/01/2016   Procedure: CONIZATION CERVIX WITH BIOPSY;   Surgeon: Chancy Milroy, MD;  Location: [facility];  Service: Gynecology;  Laterality: N/A;  . CHOLECYSTECTOMY    . HYSTEROSCOPY W/D&C N/A 01/25/2016   Procedure: DILATATION AND CURETTAGE /HYSTEROSCOPY;  Surgeon: Mora Bellman, MD;  Location: [facility];  Service: Gynecology;  Laterality: N/A;  . VAGINAL HYSTERECTOMY N/A 03/11/2017   Procedure: HYSTERECTOMY VAGINAL WITH MORCELLATION;  Surgeon: Chancy Milroy, MD;  Location: [facility];  Service: Gynecology;  Laterality: N/A;     OB History    Gravida  1   Para  1   Term  1   Preterm      AB      Living  1     SAB      TAB      Ectopic      Multiple      Live Births  1            Home Medications    Prior to Admission medications   Medication Sig Start Date End Date Taking? Authorizing Provider  acetaminophen (TYLENOL) 500 MG tablet Take 1,000 mg by mouth every 6 (six) hours as needed for moderate pain.   Yes [provider]  fluconazole (DIFLUCAN) 150 MG tablet Take 150 mg by mouth once a week. 01/07/19  Yes [provider]  Oxycodone HCl 10 MG TABS Take 10 mg by mouth every 6 (six) hours as needed  for pain. 01/07/19  Yes [provider]  famotidine (PEPCID) 20 MG tablet Take 1 tablet (20 mg total) by mouth 2 (two) times daily. Patient not taking: Reported on 10/22/2018 07/08/18   Charlesetta Shanks, MD  loperamide (IMODIUM) 2 MG capsule Take 2 capsules (4 mg total) by mouth 4 (four) times daily as needed for diarrhea or loose stools. Patient not taking: Reported on 01/25/2019 07/08/18   Charlesetta Shanks, MD  metroNIDAZOLE (FLAGYL) 500 MG tablet Take 500 mg by mouth 2 (two) times daily. 5 day supply 01/15/19   [provider]  nitrofurantoin (MACRODANTIN) 100 MG capsule Take 100 mg by mouth 2 (two) times daily. 10 day supply 01/15/19   [provider]    Family History Family History  Problem Relation Age of Onset  . Alcohol abuse Father   . Cancer Father   . Breast cancer Neg Hx      Social History Social History   Tobacco Use  . Smoking status: Never Smoker  . Smokeless tobacco: Never Used  Substance Use Topics  . Alcohol use: No  . Drug use: No     Allergies   Ciprofloxacin; Citalopram; Lamotrigine; Sulfa antibiotics; and Tramadol   Review of Systems Review of Systems  All other systems reviewed and are negative.    Physical Exam Updated Vital Signs BP 126/68 (BP Location: Right Arm)   Pulse 80   Temp 98.3 F (36.8 C) (Oral)   Resp 16   LMP  (LMP Unknown)   SpO2 100%   Physical Exam Vitals signs reviewed.  Constitutional:      Appearance: Normal appearance. She is obese.  HENT:     Head: Normocephalic.     Right Ear: External ear normal.     Left Ear: External ear normal.     Nose: Nose normal.     Mouth/Throat:     Mouth: Mucous membranes are moist.  Eyes:     Pupils: Pupils are equal, round, and reactive to light.  Neck:     Musculoskeletal: Normal range of motion.  Cardiovascular:     Rate and Rhythm: Normal rate and regular rhythm.  Pulmonary:     Effort: Pulmonary effort is normal.  Abdominal:     General: Bowel sounds are normal. There is no distension.     Palpations: Abdomen is soft.     Tenderness: There is no abdominal tenderness.  Musculoskeletal: Normal range of motion.        General: No swelling or tenderness.  Skin:    General: Skin is warm and dry.     Capillary Refill: Capillary refill takes less than 2 seconds.  Neurological:     General: No focal deficit present.     Mental Status: She is alert and oriented to person, place, and time. Mental status is at baseline.  Psychiatric:        Mood and Affect: Mood normal.        Behavior: Behavior normal.      ED Treatments / Results  Labs (all labs ordered are listed, but only abnormal results are displayed) Labs Reviewed  URINE CULTURE - Abnormal; Notable for the following components:      Result Value   Culture   (*)    Value: >=100,000 COLONIES/mL  PSEUDOMONAS AERUGINOSA 40,000 COLONIES/mL KLEBSIELLA PNEUMONIAE    Organism ID, Bacteria KLEBSIELLA PNEUMONIAE (*)    Organism ID, Bacteria PSEUDOMONAS AERUGINOSA (*)    All other components within normal limits  URINALYSIS,  ROUTINE W REFLEX MICROSCOPIC - Abnormal; Notable for the following components:   APPearance CLOUDY (*)    Hgb urine dipstick TRACE (*)    Protein, ur TRACE (*)    Leukocytes,Ua SMALL (*)    All other components within normal limits  URINALYSIS, MICROSCOPIC (REFLEX) - Abnormal; Notable for the following components:   Bacteria, UA FEW (*)    All other components within normal limits    EKG None  Radiology No results found.  Procedures Procedures (including critical care time)  Medications Ordered in ED Medications  fosfomycin (MONUROL) packet 3 g (3 g Oral Given 01/25/19 1335)     Initial Impression / Assessment and Plan / ED Course  I have reviewed the triage vital signs and the nursing notes.  Pertinent labs & imaging results that were available during my care of the patient were reviewed by me and considered in my medical decision making (see chart for details).       Reviewed labs- most recent urine grew out pseudomonas and ecoli.  Patient treated here with fosfomycin.  Patient instructed regarding return precautions and need for follow up.   Final Clinical Impressions(s) / ED Diagnoses   Final diagnoses:  Urinary tract infection without hematuria, site unspecified  Chronic back pain, unspecified back location, unspecified back pain laterality    ED Discharge Orders    None       Pattricia Boss, MD 01/29/19 1400

## 2019-02-08 ENCOUNTER — Telehealth: Payer: Self-pay | Admitting: Family Medicine

## 2019-02-08 ENCOUNTER — Ambulatory Visit: Payer: Self-pay | Admitting: Obstetrics and Gynecology

## 2019-02-08 NOTE — Telephone Encounter (Signed)
The patient called in to have her annual rescheduled. Informed the patient due to the [place] restrictions the visit is postponed. She is being placed on the wait list. She does not have any urgent concerns she would like to speak with the nurse about at this time.

## 2019-02-09 ENCOUNTER — Telehealth: Payer: Self-pay | Admitting: Family Medicine

## 2019-02-09 ENCOUNTER — Telehealth: Payer: Self-pay | Admitting: Obstetrics and Gynecology

## 2019-02-09 NOTE — Telephone Encounter (Signed)
Patient is requesting a call back from the nurse today. She has questions about something hanging out.

## 2019-02-10 NOTE — Telephone Encounter (Signed)
Pt returned RN's call regarding pt's report of vaginal discharge and fishy odor.  She also report that she feels like there is a swelling on her labia.  She said she is unable to see it to be sure but and she was too embarrassed to go into detail with the description but she would like to see a provider regarding this.  She denies any pain at the area of swelling at this time.  Pt declines prescription for BV at this time, requesting a swab instead to ensure that she is being treated for the right thing.  Will send message to front office staff.

## 2019-02-10 NOTE — Telephone Encounter (Signed)
Returned pt's call regarding her discharge and "something between her legs"  Pt picked up the phone stated she couldn't talk now and hung up before I could advise her to call back when able.

## 2019-02-15 ENCOUNTER — Ambulatory Visit: Payer: Self-pay | Admitting: Advanced Practice Midwife

## 2019-02-17 ENCOUNTER — Telehealth: Payer: Self-pay | Admitting: Family Medicine

## 2019-02-17 NOTE — Telephone Encounter (Signed)
Spoke with patient about doing a self swab. She stated she could not do that, and needed someone to do it for her.

## 2019-02-17 NOTE — Telephone Encounter (Signed)
Returned pt's call.  Pt stated that she was unable to talk and pt ended the call before I could speak.  Will address pt concerns if she contacts the office at a later time.

## 2019-02-20 ENCOUNTER — Other Ambulatory Visit: Payer: Self-pay

## 2019-02-20 ENCOUNTER — Emergency Department (HOSPITAL_COMMUNITY)
Admission: EM | Admit: 2019-02-20 | Discharge: 2019-02-20 | Disposition: A | Discharge status: Home / Self Care | Payer: Medicaid Other | Attending: Emergency Medicine | Admitting: Emergency Medicine

## 2019-02-20 ENCOUNTER — Encounter (HOSPITAL_COMMUNITY): Payer: Self-pay | Admitting: Emergency Medicine

## 2019-02-20 DIAGNOSIS — R110 Nausea: Secondary | ICD-10-CM | POA: Diagnosis present

## 2019-02-20 DIAGNOSIS — N390 Urinary tract infection, site not specified: Secondary | ICD-10-CM | POA: Diagnosis not present

## 2019-02-20 DIAGNOSIS — R112 Nausea with vomiting, unspecified: Secondary | ICD-10-CM | POA: Diagnosis not present

## 2019-02-20 DIAGNOSIS — Z79899 Other long term (current) drug therapy: Secondary | ICD-10-CM | POA: Diagnosis not present

## 2019-02-20 LAB — COMPREHENSIVE METABOLIC PANEL
ALT: 15 U/L (ref 0–44)
AST: 20 U/L (ref 15–41)
Albumin: 4.2 g/dL (ref 3.5–5.0)
Alkaline Phosphatase: 85 U/L (ref 38–126)
Anion gap: 8 (ref 5–15)
BUN: 15 mg/dL (ref 6–20)
CO2: 24 mmol/L (ref 22–32)
Calcium: 8.9 mg/dL (ref 8.9–10.3)
Chloride: 108 mmol/L (ref 98–111)
Creatinine, Ser: 0.69 mg/dL (ref 0.44–1.00)
GFR calc Af Amer: >60 mL/min (ref >60)
GFR calc non Af Amer: >60 mL/min (ref >60)
Glucose, Bld: 84 mg/dL (ref 70–99)
Potassium: 3.9 mmol/L (ref 3.5–5.1)
Sodium: 140 mmol/L (ref 135–145)
Total Bilirubin: 0.8 mg/dL (ref 0.3–1.2)
Total Protein: 6.8 g/dL (ref 6.5–8.1)

## 2019-02-20 LAB — URINALYSIS, ROUTINE W REFLEX MICROSCOPIC
Bilirubin Urine: NEGATIVE
Glucose, UA: NEGATIVE mg/dL
Ketones, ur: NEGATIVE mg/dL
Leukocytes,Ua: NEGATIVE
Nitrite: NEGATIVE
Protein, ur: NEGATIVE mg/dL
Specific Gravity, Urine: 1.024 (ref 1.005–1.030)
pH: 5 (ref 5.0–8.0)

## 2019-02-20 LAB — CBC
HCT: 43.9 % (ref 36.0–46.0)
Hemoglobin: 14.5 g/dL (ref 12.0–15.0)
MCH: 33.6 pg (ref 26.0–34.0)
MCHC: 33 g/dL (ref 30.0–36.0)
MCV: 101.6 fL — ABNORMAL HIGH (ref 80.0–100.0)
Platelets: 203 10*3/uL (ref 150–400)
RBC: 4.32 MIL/uL (ref 3.87–5.11)
RDW: 14.1 % (ref 11.5–15.5)
WBC: 6.5 10*3/uL (ref 4.0–10.5)
nRBC: 0 % (ref 0.0–0.2)

## 2019-02-20 LAB — LIPASE, BLOOD: Lipase: 28 U/L (ref 11–51)

## 2019-02-20 MED ORDER — SODIUM CHLORIDE 0.9 % IV SOLN
1.0000 g | Freq: Once | INTRAVENOUS | Status: AC
  Administered 2019-02-20: 1 g via INTRAVENOUS
  Filled 2019-02-20: qty 10

## 2019-02-20 MED ORDER — ONDANSETRON 4 MG PO TBDP
4.0000 mg | ORAL_TABLET | Freq: Once | ORAL | Status: AC | PRN
  Administered 2019-02-20: 4 mg via ORAL
  Filled 2019-02-20: qty 1

## 2019-02-20 MED ORDER — CEPHALEXIN 500 MG PO CAPS: 500.0000 mg | ORAL_CAPSULE | Freq: Three times a day (TID) | ORAL | 0 refills | Status: DC

## 2019-02-20 MED ORDER — ONDANSETRON 4 MG PO TBDP: 4.0000 mg | ORAL_TABLET | Freq: Three times a day (TID) | ORAL | 0 refills | Status: DC | PRN

## 2019-02-20 MED ORDER — SODIUM CHLORIDE 0.9% FLUSH
3.0000 mL | Freq: Once | INTRAVENOUS | Status: AC
  Administered 2019-02-20: 3 mL via INTRAVENOUS

## 2019-02-20 NOTE — Discharge Instructions (Addendum)
We saw you in the ER for nausea, vomiting. We gave you the antibiotics in the ER.  We are sending you home with prescription for the UTI and also the nausea medication.  Given that your infection is recurrent, we advised that you follow-up with either a uro-gynecologist or urologist. The question to ask of the specialist is if there is any specific reason, such as anatomy, that is putting you at risk for having recurrent UTIs and what if anything you can do to prevent recurrent infection.

## 2019-02-20 NOTE — ED Triage Notes (Addendum)
Pt c/o nausea and vomiting x 2 days pt states r/t new antibiotic for UTI. New meds are doxycycline and metronidazole.

## 2019-02-20 NOTE — ED Provider Notes (Signed)
[facility] DEPT Provider Note   CSN: [id number] Arrival date & time: 02/20/19  0840    History   Chief Complaint Chief Complaint  Patient presents with  . Nausea  . Emesis    HPI Jordan [name] is a 53 y.o. female.     HPI  53 year old female with history of multiple UTIs, alcohol abuse comes in a chief complaint of nausea and vomiting.  Patient reports that she has been having very frequent UTIs over the last 14 years.  She was started on Doxy and Flagyl by her nurse practitioner for the UTI on Monday.  Patient states that she has taken the medicine for 2 days, but now she is having nausea with vomiting after each dose of medicine.  She denies any new back pain, flank pain and she also denies fevers or chills.  Past Medical History:  Diagnosis Date  . Alcohol abuse   . Anemia    patient denies  . Bipolar 1 disorder (HCC)    No medications currently  . CAP (community acquired pneumonia) 03/17/2015  . 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 Mental disorder   . Obesity   . PICC line infection 05/17/2015  . Sepsis due to Gram negative bacteria (MDR E Coli) 02/18/2015  . UTI (lower urinary tract infection)   . Vaginal Pap smear, abnormal     Patient Active Problem List   Diagnosis Date Noted  . Post-operative state 03/11/2017  . Painful lumpy right breast 07/27/2016  . Urinary tract infection 07/01/2015  . Diastolic dysfunction 53/61/4431  . Constipation 05/12/2015  . ESBL (extended spectrum beta-lactamase) producing bacteria infection 03/17/2015  . Diarrhea 03/03/2015  . Weakness 03/02/2015  . 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 Generalized anxiety disorder 02/20/2015  . Claustrophobia 02/20/2015  . Transaminitis 02/18/2015  . Chest pain 02/18/2015  . Abdominal pain   . Dizziness   . Hypotension 01/04/2015  . Bipolar disorder ([place]) 06/29/2012    Past Surgical History:  Procedure Laterality Date  . CERVICAL  CONIZATION W/BX N/A 07/01/2016   Procedure: CONIZATION CERVIX WITH BIOPSY;  Surgeon: Chancy Milroy, MD;  Location: [facility];  Service: Gynecology;  Laterality: N/A;  . CHOLECYSTECTOMY    . HYSTEROSCOPY W/D&C N/A 01/25/2016   Procedure: DILATATION AND CURETTAGE /HYSTEROSCOPY;  Surgeon: Mora Bellman, MD;  Location: [facility];  Service: Gynecology;  Laterality: N/A;  . VAGINAL HYSTERECTOMY N/A 03/11/2017   Procedure: HYSTERECTOMY VAGINAL WITH MORCELLATION;  Surgeon: Chancy Milroy, MD;  Location: [facility];  Service: Gynecology;  Laterality: N/A;     OB History    Gravida  1   Para  1   Term  1   Preterm      AB      Living  1     SAB      TAB      Ectopic      Multiple      Live Births  1            Home Medications    Prior to Admission medications   Medication Sig Start Date End Date Taking? Authorizing Provider  acetaminophen (TYLENOL) 500 MG tablet Take 1,000 mg by mouth every 6 (six) hours as needed for moderate pain.   Yes [provider]  Oxycodone HCl 10 MG TABS Take 10 mg by mouth every 6 (six) hours as needed for pain. 01/07/19  Yes [provider]  Vitamin D, Ergocalciferol, (DRISDOL) 1.25  MG (50000 UT) CAPS capsule Take 50,000 Units by mouth once a week. 02/01/19  Yes [provider]  cephALEXin (KEFLEX) 500 MG capsule Take 1 capsule (500 mg total) by mouth 3 (three) times daily. 02/20/19   Varney Biles, MD  famotidine (PEPCID) 20 MG tablet Take 1 tablet (20 mg total) by mouth 2 (two) times daily. Patient not taking: Reported on 10/22/2018 07/08/18   Charlesetta Shanks, MD  loperamide (IMODIUM) 2 MG capsule Take 2 capsules (4 mg total) by mouth 4 (four) times daily as needed for diarrhea or loose stools. Patient not taking: Reported on 01/25/2019 07/08/18   Charlesetta Shanks, MD  ondansetron (ZOFRAN ODT) 4 MG disintegrating tablet Take 1 tablet (4 mg total) by mouth every 8 (eight) hours as needed for nausea or vomiting. 02/20/19   Varney Biles,  MD    Family History Family History  Problem Relation Age of Onset  . Alcohol abuse Father   . Cancer Father   . Breast cancer Neg Hx     Social History Social History   Tobacco Use  . Smoking status: Never Smoker  . Smokeless tobacco: Never Used  Substance Use Topics  . Alcohol use: No  . Drug use: No     Allergies   Ciprofloxacin; Citalopram; Lamotrigine; Sulfa antibiotics; and Tramadol   Review of Systems Review of Systems  Constitutional: Positive for activity change.  Cardiovascular: Negative for chest pain.  Gastrointestinal: Positive for nausea and vomiting.  Genitourinary: Positive for frequency.  Allergic/Immunologic: Negative for immunocompromised state.     Physical Exam Updated Vital Signs BP 112/90 (BP Location: Left Arm)   Pulse 79   Temp 98 F (36.7 C) (Oral)   Resp 16   Ht 5\' 8"  (1.727 m)   Wt 104.3 kg   LMP  (LMP Unknown)   SpO2 100%   BMI 34.97 kg/m   Physical Exam Vitals signs and nursing note reviewed.  Constitutional:      Appearance: She is well-developed.  HENT:     Head: Normocephalic and atraumatic.  Neck:     Musculoskeletal: Normal range of motion and neck supple.  Cardiovascular:     Rate and Rhythm: Normal rate.  Pulmonary:     Effort: Pulmonary effort is normal.  Abdominal:     General: Bowel sounds are normal.     Tenderness: There is no abdominal tenderness.  Skin:    General: Skin is warm and dry.  Neurological:     Mental Status: She is alert and oriented to person, place, and time.      ED Treatments / Results  Labs (all labs ordered are listed, but only abnormal results are displayed) Labs Reviewed  CBC - Abnormal; Notable for the following components:      Result Value   MCV 101.6 (*)    All other components within normal limits  URINALYSIS, ROUTINE W REFLEX MICROSCOPIC - Abnormal; Notable for the following components:   Hgb urine dipstick SMALL (*)    Bacteria, UA RARE (*)    All other  components within normal limits  URINE CULTURE  LIPASE, BLOOD  COMPREHENSIVE METABOLIC PANEL    EKG None  Radiology No results found.  Procedures Procedures (including critical care time)  Medications Ordered in ED Medications  sodium chloride flush (NS) 0.9 % injection 3 mL (3 mLs Intravenous Given 02/20/19 1206)  ondansetron (ZOFRAN-ODT) disintegrating tablet 4 mg (4 mg Oral Given 02/20/19 0913)  cefTRIAXone (ROCEPHIN) 1 g in sodium chloride 0.9 %  100 mL IVPB (0 g Intravenous Stopped 02/20/19 1403)     Initial Impression / Assessment and Plan / ED Course  I have reviewed the triage vital signs and the nursing notes.  Pertinent labs & imaging results that were available during my care of the patient were reviewed by me and considered in my medical decision making (see chart for details).        Patient comes in a chief complaint of nausea and vomiting.  Patient reports that she was started on Doxy and Flagyl for UTI 2 days ago.  She states that her symptoms are typically worse after she takes the antibiotics.  She continues to have urinary frequency, but her dysuria has cleared.  She has no abdominal pain or diarrhea.  Patient reports that she has been having UTI constantly for the last 14 years and that the doctors have not been able to figure out why or actually treat her UTI either.  She has been very frustrated with her care thus far.  We will switch patient to Keflex.  Urine cultures in the past have revealed that she has grown E. coli, Klebsiella and Pseudomonas.  She has been susceptible to cephalosporin at all times.  We will make sure that Keflex is for him to 14 days in duration.  P.o. challenge initiated.  Labs ordered.  2:32 PM UA looks reassuring.  Culture sent.  Labs are all appearing within normal limits.  Patient is passed oral challenge. She will be discharged with a diagnosis of cystitis-pyelonephritis.  She will be advised to follow-up with urology for further  assessment on her recurrent UTI.  Final Clinical Impressions(s) / ED Diagnoses   Final diagnoses:  Lower urinary tract infectious disease  Nausea and vomiting, intractability of vomiting not specified, unspecified vomiting type    ED Discharge Orders         Ordered    cephALEXin (KEFLEX) 500 MG capsule  3 times daily     02/20/19 1359    ondansetron (ZOFRAN ODT) 4 MG disintegrating tablet  Every 8 hours PRN     02/20/19 1359           Varney Biles, MD 02/20/19 1433

## 2019-02-20 NOTE — ED Notes (Signed)
Patient given sandwich, ginger ale and chicken broth per request

## 2019-02-21 LAB — URINE CULTURE: Culture: <10000 — AB

## 2019-02-22 ENCOUNTER — Other Ambulatory Visit (HOSPITAL_COMMUNITY): Payer: Self-pay | Admitting: Family Medicine

## 2019-02-22 DIAGNOSIS — R079 Chest pain, unspecified: Secondary | ICD-10-CM

## 2019-03-06 ENCOUNTER — Ambulatory Visit (INDEPENDENT_AMBULATORY_CARE_PROVIDER_SITE_OTHER): Payer: Medicaid Other

## 2019-03-06 ENCOUNTER — Other Ambulatory Visit: Payer: Self-pay

## 2019-03-06 ENCOUNTER — Ambulatory Visit (HOSPITAL_COMMUNITY)
Admission: EM | Admit: 2019-03-06 | Discharge: 2019-03-06 | Disposition: A | Discharge status: Home / Self Care | Payer: Medicaid Other | Attending: Family Medicine | Admitting: Family Medicine

## 2019-03-06 ENCOUNTER — Encounter (HOSPITAL_COMMUNITY): Payer: Self-pay | Admitting: Emergency Medicine

## 2019-03-06 DIAGNOSIS — M5489 Other dorsalgia: Secondary | ICD-10-CM

## 2019-03-06 DIAGNOSIS — M549 Dorsalgia, unspecified: Secondary | ICD-10-CM

## 2019-03-06 DIAGNOSIS — W050XXA Fall from non-moving wheelchair, initial encounter: Secondary | ICD-10-CM | POA: Diagnosis not present

## 2019-03-06 DIAGNOSIS — W19XXXA Unspecified fall, initial encounter: Secondary | ICD-10-CM

## 2019-03-06 DIAGNOSIS — M546 Pain in thoracic spine: Secondary | ICD-10-CM

## 2019-03-06 MED ORDER — CYCLOBENZAPRINE HCL 5 MG PO TABS: 5.0000 mg | ORAL_TABLET | Freq: Three times a day (TID) | ORAL | 0 refills | Status: DC | PRN

## 2019-03-06 NOTE — ED Triage Notes (Signed)
Pt sts fall yesterday and now having increased back pain; pt with chronic back issues

## 2019-03-06 NOTE — ED Provider Notes (Signed)
[place]    CSN: [id number] Arrival date & time: 03/06/19  1313     History    Musculoskeletal Exam  Patient: Jordan [name] DOB: April 05, 1966  DOS: 03/06/2019  SUBJECTIVE:  Chief Complaint:   Chief Complaint  Patient presents with  . Fall    Jordan [name] is a 53 y.o.  female for evaluation and treatment of fall.   Onset:  3 d, fell off of wheel chair onto back Location: mid back, b/l Character:  aching and throbbing  Progression of issue:  is unchanged Associated symptoms: none; denies bruising, swelling Treatment: to date has been acetaminophen and her chronic oxy.   Neurovascular symptoms: no  ROS: Musculoskeletal/Extremities: +back pain  Past Medical History:  Diagnosis Date  . Alcohol abuse   . Anemia    patient denies  . Bipolar 1 disorder (HCC)    No medications currently  . CAP (community acquired pneumonia) 03/17/2015  . 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 Mental disorder   . Obesity   . PICC line infection 05/17/2015  . Sepsis due to Gram negative bacteria (MDR E Coli) 02/18/2015  . UTI (lower urinary tract infection)   . Vaginal Pap smear, abnormal     Objective: VITAL SIGNS: BP 126/83 (BP Location: Right Arm)   Pulse 73   Temp 98.1 F (36.7 C) (Oral)   Resp 18   LMP  (LMP Unknown)   SpO2 99%  Constitutional: Well formed, well developed. No acute distress. Cardiovascular: Brisk cap refill Thorax & Lungs: No accessory muscle use Musculoskeletal: T spine.   Tenderness to palpation: over lower thoracic parasp msc and midline Deformity: no Ecchymosis: no Neurologic: Normal sensory function. No focal deficits noted.  Psychiatric: Normal mood. Age appropriate judgment and insight. Alert & oriented x 3.     Final Clinical Impressions(s) / UC Diagnoses   Final diagnoses:  Fall, initial encounter   Pt asking for rx for new wheelchair and home health. Defer to pcp. XR results neg, await final read. Pt told  repeatedly that if things look normal, no call will be made.      Discharge Instructions     Ice/cold pack over area for 10-15 min twice daily.  Heat (pad or rice pillow in microwave) over affected area, 10-15 minutes twice daily.   Cont Tylenol.  If the radiologist sees anything on the X-ray that we missed, we will let you know. Otherwise, no news is good news.   Take Flexeril (cyclobenzaprine) 1-2 hours before planned bedtime. If it makes you drowsy, do not take during the day. You can try half a tab the following night.  Mid-Back Strain Rehab It is normal to feel mild stretching, pulling, tightness, or discomfort as you do these exercises, but you should stop right away if you feel sudden pain or your pain gets worse.   Stretching and range of motion exercises This exercise warms up your muscles and joints and improves the movement and flexibility of your back and shoulders. This exercise also help to relieve pain. Exercise A: Chest and spine stretch    1. Lie down on your back on a firm surface. 2. Roll a towel or a small blanket so it is about 4 inches (10 cm) in diameter. 3. Put the towel lengthwise under the middle of your back so it is under your spine, but not under your shoulder blades. 4. To increase the stretch, you may put your hands behind your head  and let your elbows fall to your sides. 5. Hold for 30 seconds. Repeat exercise 2 times. Complete this exercise 3 times per week.  Strengthening exercises These exercises build strength and endurance in your back and your shoulder blade muscles. Endurance is the ability to use your muscles for a long time, even after they get tired. Exercise B: Alternating arm and leg raises    1. Get on your hands and knees on a firm surface. If you are on a hard floor, you may want to use padding to cushion your knees, such as an exercise mat. 2. Line up your arms and legs. Your hands should be below your shoulders, and your knees  should be below your hips. 3. Lift your left leg behind you. At the same time, raise your right arm and straighten it in front of you. ? Do not lift your leg higher than your hip. ? Do not lift your arm higher than your shoulder. ? Keep your abdominal and back muscles tight. ? Keep your hips facing the ground. ? Do not arch your back. ? Keep your balance carefully, and do not hold your breath. 4. Hold for 3 seconds. 5. Slowly return to the starting position and repeat with your right leg and your left arm. Repeat 2 times. Complete this exercise 3 times per week. Exercise C: Straight arm rows (shoulder extension)     1. Stand with your feet shoulder width apart. 2. Secure an exercise band to a stable object in front of you so the band is at or above shoulder height. 3. Hold one end of the exercise band in each hand. 4. Straighten your elbows and lift your hands up to shoulder height. 5. Step back, away from the secured end of the exercise band, until the band stretches. 6. Squeeze your shoulder blades together and pull your hands down to the sides of your thighs. Stop when your hands are straight down by your sides. Do not let your hands go behind your body. 7. Hold for 3 seconds. 8. Slowly return to the starting position. Repeat 2 times. Complete this exercise 3 times per week. Exercise D: Shoulder external rotation, prone 1. Lie on your abdomen on a firm bed so your left / right forearm hangs over the edge of the bed and your upper arm is on the bed, straight out from your body. ? Your elbow should be bent. ? Your palm should be facing your feet. 2. If instructed, hold a 5 lb weight in your hand. 3. Squeeze your shoulder blade toward the middle of your back. Do not let your shoulder lift toward your ear. 4. Keep your elbow bent in an "L" shape (90 degrees) while you slowly move your forearm up toward the ceiling. Move your forearm up to the height of the bed, toward your head. ? Your  upper arm should not move. ? At the top of the movement, your palm should face the floor. 5. Hold for 3 seconds. 6. Slowly return to the starting position and relax your muscles. Repeat 3 times. Complete this exercise 3 times per week. Exercise E: Scapular retraction and external rotation, rowing    1. Sit in a stable chair without armrests, or stand. 2. Secure an exercise band to a stable object in front of you so it is at shoulder height. 3. Hold one end of the exercise band in each hand. 4. Bring your arms out straight in front of you. 5. Step back, away  from the secured end of the exercise band, until the band stretches. 6. Pull the band backward. As you do this, bend your elbows and squeeze your shoulder blades together, but avoid letting the rest of your body move. Do not let your shoulders lift up toward your ears. 7. Stop when your elbows are at your sides or slightly behind your body. 8. Hold for 5 seconds. 9. Slowly straighten your arms to return to the starting position. Repeat 2 times. Complete this exercise 3 times per week. Posture and body mechanics    Body mechanics refers to the movements and positions of your body while you do your daily activities. Posture is part of body mechanics. Good posture and healthy body mechanics can help to relieve stress in your body's tissues and joints. Good posture means that your spine is in its natural S-curve position (your spine is neutral), your shoulders are pulled back slightly, and your head is not tipped forward. The following are general guidelines for applying improved posture and body mechanics to your everyday activities. Standing     When standing, keep your spine neutral and your feet about hip-width apart. Keep a slight bend in your knees. Your ears, shoulders, and hips should line up.  When you do a task in which you lean forward while standing in one place for a long time, place one foot up on a stable object that is 2-4  inches (5-10 cm) high, such as a footstool. This helps keep your spine neutral. Sitting     When sitting, keep your spine neutral and keep your feet flat on the floor. Use a footrest, if necessary, and keep your thighs parallel to the floor. Avoid rounding your shoulders, and avoid tilting your head forward.  When working at a desk or a computer, keep your desk at a height where your hands are slightly lower than your elbows. Slide your chair under your desk so you are close enough to maintain good posture.  When working at a computer, place your monitor at a height where you are looking straight ahead and you do not have to tilt your head forward or downward to look at the screen. Resting    When lying down and resting, avoid positions that are most painful for you.  If you have pain with activities such as sitting, bending, stooping, or squatting (flexion-based activities), lie in a position in which your body does not bend very much. For example, avoid curling up on your side with your arms and knees near your chest (fetal position).  If you have pain with activities such as standing for a long time or reaching with your arms (extension-based activities), lie with your spine in a neutral position and bend your knees slightly. Try the following positions:  Lying on your side with a pillow between your knees.  Lying on your back with a pillow under your knees.   Lifting     When lifting objects, keep your feet at least shoulder-width apart and tighten your abdominal muscles.  Bend your knees and hips and keep your spine neutral. It is important to lift using the strength of your legs, not your back. Do not lock your knees straight out.  Always ask for help to lift heavy or awkward objects. Make sure you discuss any questions you have with your health care provider.     ED Prescriptions    Medication Sig Dispense Auth. Provider   cyclobenzaprine (FLEXERIL) 5 MG tablet Take 1  tablet (5 mg total) by mouth 3 (three) times daily as needed for muscle spasms. 20 tablet Shelda Pal, DO     Controlled Substance Prescriptions [place] Controlled Substance Registry consulted? Yes, I have consulted the [place] Controlled Substances Registry for this patient, and feel the risk/benefit ratio today is favorable for proceeding with this prescription for a controlled substance.   Shelda Pal, DO 03/06/19 1424

## 2019-03-06 NOTE — Discharge Instructions (Addendum)
Ice/cold pack over area for 10-15 min twice daily.  Heat (pad or rice pillow in microwave) over affected area, 10-15 minutes twice daily.   Cont Tylenol.  If the radiologist sees anything on the X-ray that we missed, we will let you know. Otherwise, no news is good news.   Take Flexeril (cyclobenzaprine) 1-2 hours before planned bedtime. If it makes you drowsy, do not take during the day. You can try half a tab the following night.  Mid-Back Strain Rehab It is normal to feel mild stretching, pulling, tightness, or discomfort as you do these exercises, but you should stop right away if you feel sudden pain or your pain gets worse.   Stretching and range of motion exercises This exercise warms up your muscles and joints and improves the movement and flexibility of your back and shoulders. This exercise also help to relieve pain. Exercise A: Chest and spine stretch    Lie down on your back on a firm surface. Roll a towel or a small blanket so it is about 4 inches (10 cm) in diameter. Put the towel lengthwise under the middle of your back so it is under your spine, but not under your shoulder blades. To increase the stretch, you may put your hands behind your head and let your elbows fall to your sides. Hold for 30 seconds. Repeat exercise 2 times. Complete this exercise 3 times per week.  Strengthening exercises These exercises build strength and endurance in your back and your shoulder blade muscles. Endurance is the ability to use your muscles for a long time, even after they get tired. Exercise B: Alternating arm and leg raises    Get on your hands and knees on a firm surface. If you are on a hard floor, you may want to use padding to cushion your knees, such as an exercise mat. Line up your arms and legs. Your hands should be below your shoulders, and your knees should be below your hips. Lift your left leg behind you. At the same time, raise your right arm and straighten it in front  of you. Do not lift your leg higher than your hip. Do not lift your arm higher than your shoulder. Keep your abdominal and back muscles tight. Keep your hips facing the ground. Do not arch your back. Keep your balance carefully, and do not hold your breath. Hold for 3 seconds. Slowly return to the starting position and repeat with your right leg and your left arm. Repeat 2 times. Complete this exercise 3 times per week. Exercise C: Straight arm rows (shoulder extension)     Stand with your feet shoulder width apart. Secure an exercise band to a stable object in front of you so the band is at or above shoulder height. Hold one end of the exercise band in each hand. Straighten your elbows and lift your hands up to shoulder height. Step back, away from the secured end of the exercise band, until the band stretches. Squeeze your shoulder blades together and pull your hands down to the sides of your thighs. Stop when your hands are straight down by your sides. Do not let your hands go behind your body. Hold for 3 seconds. Slowly return to the starting position. Repeat 2 times. Complete this exercise 3 times per week. Exercise D: Shoulder external rotation, prone Lie on your abdomen on a firm bed so your left / right forearm hangs over the edge of the bed and your upper arm is  on the bed, straight out from your body. Your elbow should be bent. Your palm should be facing your feet. If instructed, hold a 5 lb weight in your hand. Squeeze your shoulder blade toward the middle of your back. Do not let your shoulder lift toward your ear. Keep your elbow bent in an "L" shape (90 degrees) while you slowly move your forearm up toward the ceiling. Move your forearm up to the height of the bed, toward your head. Your upper arm should not move. At the top of the movement, your palm should face the floor. Hold for 3 seconds. Slowly return to the starting position and relax your muscles. Repeat 3  times. Complete this exercise 3 times per week. Exercise E: Scapular retraction and external rotation, rowing    Sit in a stable chair without armrests, or stand. Secure an exercise band to a stable object in front of you so it is at shoulder height. Hold one end of the exercise band in each hand. Bring your arms out straight in front of you. Step back, away from the secured end of the exercise band, until the band stretches. Pull the band backward. As you do this, bend your elbows and squeeze your shoulder blades together, but avoid letting the rest of your body move. Do not let your shoulders lift up toward your ears. Stop when your elbows are at your sides or slightly behind your body. Hold for 5 seconds. Slowly straighten your arms to return to the starting position. Repeat 2 times. Complete this exercise 3 times per week. Posture and body mechanics    Body mechanics refers to the movements and positions of your body while you do your daily activities. Posture is part of body mechanics. Good posture and healthy body mechanics can help to relieve stress in your body's tissues and joints. Good posture means that your spine is in its natural S-curve position (your spine is neutral), your shoulders are pulled back slightly, and your head is not tipped forward. The following are general guidelines for applying improved posture and body mechanics to your everyday activities. Standing    When standing, keep your spine neutral and your feet about hip-width apart. Keep a slight bend in your knees. Your ears, shoulders, and hips should line up. When you do a task in which you lean forward while standing in one place for a long time, place one foot up on a stable object that is 2-4 inches (5-10 cm) high, such as a footstool. This helps keep your spine neutral. Sitting    When sitting, keep your spine neutral and keep your feet flat on the floor. Use a footrest, if necessary, and keep your thighs  parallel to the floor. Avoid rounding your shoulders, and avoid tilting your head forward. When working at a desk or a computer, keep your desk at a height where your hands are slightly lower than your elbows. Slide your chair under your desk so you are close enough to maintain good posture. When working at a computer, place your monitor at a height where you are looking straight ahead and you do not have to tilt your head forward or downward to look at the screen. Resting    When lying down and resting, avoid positions that are most painful for you. If you have pain with activities such as sitting, bending, stooping, or squatting (flexion-based activities), lie in a position in which your body does not bend very much. For example, avoid curling  up on your side with your arms and knees near your chest (fetal position). If you have pain with activities such as standing for a long time or reaching with your arms (extension-based activities), lie with your spine in a neutral position and bend your knees slightly. Try the following positions: Lying on your side with a pillow between your knees. Lying on your back with a pillow under your knees.   Lifting    When lifting objects, keep your feet at least shoulder-width apart and tighten your abdominal muscles. Bend your knees and hips and keep your spine neutral. It is important to lift using the strength of your legs, not your back. Do not lock your knees straight out. Always ask for help to lift heavy or awkward objects. Make sure you discuss any questions you have with your health care provider.

## 2019-03-08 ENCOUNTER — Telehealth (HOSPITAL_COMMUNITY): Payer: Self-pay | Admitting: Emergency Medicine

## 2019-03-08 NOTE — Telephone Encounter (Signed)
Patient called asking about results, results given. All questions answered.

## 2019-03-24 ENCOUNTER — Ambulatory Visit (HOSPITAL_COMMUNITY): Payer: Medicaid Other

## 2019-04-01 ENCOUNTER — Ambulatory Visit
Admission: RE | Admit: 2019-04-01 | Discharge: 2019-04-01 | Disposition: A | Discharge status: Home / Self Care | Payer: Medicaid Other | Source: Ambulatory Visit | Attending: Nurse Practitioner | Admitting: Nurse Practitioner

## 2019-04-01 ENCOUNTER — Other Ambulatory Visit: Payer: Self-pay

## 2019-04-01 DIAGNOSIS — Z1231 Encounter for screening mammogram for malignant neoplasm of breast: Secondary | ICD-10-CM

## 2019-04-12 ENCOUNTER — Ambulatory Visit: Payer: Medicaid Other | Admitting: Obstetrics and Gynecology

## 2019-04-21 ENCOUNTER — Other Ambulatory Visit: Payer: Self-pay | Admitting: Obstetrics and Gynecology

## 2019-04-21 ENCOUNTER — Other Ambulatory Visit: Payer: Self-pay

## 2019-04-21 ENCOUNTER — Ambulatory Visit (INDEPENDENT_AMBULATORY_CARE_PROVIDER_SITE_OTHER): Payer: Medicaid Other | Admitting: Obstetrics and Gynecology

## 2019-04-21 ENCOUNTER — Other Ambulatory Visit (HOSPITAL_COMMUNITY)
Admission: RE | Admit: 2019-04-21 | Discharge: 2019-04-21 | Disposition: A | Discharge status: Home / Self Care | Payer: Medicaid Other | Source: Ambulatory Visit | Attending: Obstetrics and Gynecology | Admitting: Obstetrics and Gynecology

## 2019-04-21 ENCOUNTER — Encounter: Payer: Self-pay | Admitting: Obstetrics and Gynecology

## 2019-04-21 VITALS — BP 137/71 | HR 80 | Temp 98.0°F | Ht 68.0 in | Wt 273.8 lb

## 2019-04-21 DIAGNOSIS — N952 Postmenopausal atrophic vaginitis: Secondary | ICD-10-CM

## 2019-04-21 DIAGNOSIS — Z01419 Encounter for gynecological examination (general) (routine) without abnormal findings: Secondary | ICD-10-CM | POA: Diagnosis present

## 2019-04-21 DIAGNOSIS — Z0000 Encounter for general adult medical examination without abnormal findings: Secondary | ICD-10-CM | POA: Diagnosis not present

## 2019-04-21 MED ORDER — PREMARIN 0.625 MG/GM VA CREA: 1.0000 | TOPICAL_CREAM | VAGINAL | 12 refills | Status: DC

## 2019-04-21 NOTE — Patient Instructions (Signed)
Atrophic Vaginitis Atrophic vaginitis is a condition in which the tissues that line the vagina become dry and thin. This condition occurs in women who have stopped having their period. It is caused by a drop in a female hormone (estrogen). This hormone helps:  To keep the vagina moist.  To make a clear fluid. This clear fluid helps: ? To make the vagina ready for sex. ? To protect the vagina from infection. If the lining of the vagina is dry and thin, it may cause irritation, burning, or itchiness. It may also:  Make sex painful.  Make an exam of your vagina painful.  Cause bleeding.  Make you lose interest in sex.  Cause a burning feeling when you pee (urinate).  Cause a brown or yellow fluid to come from your vagina. Some women do not have symptoms. Follow these instructions at home: Medicines  Take over-the-counter and prescription medicines only as told by your doctor.  Do not use herbs or other medicines unless your doctor says it is okay.  Use medicines for for dryness. These include: ? Oils to make the vagina soft. ? Creams. ? Moisturizers. General instructions  Do not douche.  Do not use products that can make your vagina dry. These include: ? Scented sprays. ? Scented tampons. ? Scented soaps.  Sex can help increase blood flow and soften the tissue in the vagina. If it hurts to have sex: ? Tell your partner. ? Use products to make sex more comfortable. Use these only as told by your doctor. Contact a doctor if you:  Have discharge from the vagina that is different than usual.  Have a bad smell coming from your vagina.  Have new symptoms.  Do not get better.  Get worse. Summary  Atrophic vaginitis is a condition in which the lining of the vagina becomes dry and thin.  This condition affects women who have stopped having their periods.  Treatment may include using products that help make the vagina soft.  Call a doctor if do not get better with  treatment. This information is not intended to replace advice given to you by your health care provider. Make sure you discuss any questions you have with your health care provider. Document Released: 03/25/2008 Document Revised: 10/20/2017 Document Reviewed: 10/20/2017 [employer] Patient Education  2020 [employer]  

## 2019-04-26 ENCOUNTER — Encounter (HOSPITAL_COMMUNITY): Payer: Self-pay

## 2019-04-26 ENCOUNTER — Encounter (HOSPITAL_COMMUNITY): Admission: RE | Admit: 2019-04-26 | Payer: Medicaid Other | Source: Ambulatory Visit

## 2019-04-26 ENCOUNTER — Telehealth: Payer: Self-pay | Admitting: Obstetrics & Gynecology

## 2019-04-26 NOTE — Progress Notes (Signed)
Jordan [name] is a 53 y.o. G10P1001 female here for a routine annual gynecologic exam.  Current complaints: occ vaginal discharge.   She is a yr out from a TVH/BS. Not sexual active.    Gynecologic History No LMP recorded (lmp unknown). Patient has had a hysterectomy. Contraception: status post hysterectomy Last mammogram: 2020. Results were: normal  Obstetric History OB History  Gravida Para Term Preterm AB Living  1 1 1     1   SAB TAB Ectopic Multiple Live Births          1    # Outcome Date GA Lbr Len/2nd Weight Sex Delivery Anes PTL Lv  1 Term 05/17/96 [redacted]w[redacted]d   F Vag-Spont EPI N LIV    Past Medical History:  Diagnosis Date  . Alcohol abuse   . Anemia    patient denies  . Bipolar 1 disorder (HCC)    No medications currently  . CAP (community acquired pneumonia) 03/17/2015  . 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 Mental disorder   . Obesity   . PICC line infection 05/17/2015  . Sepsis due to Gram negative bacteria (MDR E Coli) 02/18/2015  . UTI (lower urinary tract infection)   . Vaginal Pap smear, abnormal     Past Surgical History:  Procedure Laterality Date  . CERVICAL CONIZATION W/BX N/A 07/01/2016   Procedure: CONIZATION CERVIX WITH BIOPSY;  Surgeon: Chancy Milroy, MD;  Location: [facility];  Service: Gynecology;  Laterality: N/A;  . CHOLECYSTECTOMY    . HYSTEROSCOPY W/D&C N/A 01/25/2016   Procedure: DILATATION AND CURETTAGE /HYSTEROSCOPY;  Surgeon: Mora Bellman, MD;  Location: [facility];  Service: Gynecology;  Laterality: N/A;  . VAGINAL HYSTERECTOMY N/A 03/11/2017   Procedure: HYSTERECTOMY VAGINAL WITH MORCELLATION;  Surgeon: Chancy Milroy, MD;  Location: [facility];  Service: Gynecology;  Laterality: N/A;    Current Outpatient Medications on File Prior to Visit  Medication Sig Dispense Refill  . acetaminophen (TYLENOL) 500 MG tablet Take 1,000 mg by mouth every 6 (six) hours as needed for moderate pain.    . Oxycodone HCl 10 MG TABS Take 10 mg by  mouth every 6 (six) hours as needed for pain.    . cyclobenzaprine (FLEXERIL) 5 MG tablet Take 1 tablet (5 mg total) by mouth 3 (three) times daily as needed for muscle spasms. (Patient not taking: Reported on 04/21/2019) 20 tablet 0  . Vitamin D, Ergocalciferol, (DRISDOL) 1.25 MG (50000 UT) CAPS capsule Take 50,000 Units by mouth once a week.     No current facility-administered medications on file prior to visit.     Allergies  Allergen Reactions  . Ciprofloxacin Swelling    Lips swell, tongue swells, face swells  . Citalopram Other (See Comments)    Possible cause of pancytopenia. Swelling of tongue, face and throat  . Lamotrigine Other (See Comments)    Possible cause of pancytopenia. Swelling of face, throat and tongue  . Sulfa Antibiotics Anaphylaxis  . Tramadol Anaphylaxis, Shortness Of Breath and Swelling    Social History   Socioeconomic History  . Marital status: Soil scientist    Spouse name: Not on file  . Number of children: Not on file  . Years of education: Not on file  . Highest education level: Not on file  Occupational History  . Not on file  Social Needs  . Financial resource strain: Not on file  . Food insecurity    Worry: Not on file  Inability: Not on file  . Transportation needs    Medical: Not on file    Non-medical: Not on file  Tobacco Use  . Smoking status: Never Smoker  . Smokeless tobacco: Never Used  Substance and Sexual Activity  . Alcohol use: No  . Drug use: No  . Sexual activity: Yes    Birth control/protection: Post-menopausal, Surgical    Comment: perimenopausal; no sex in years  Lifestyle  . Physical activity    Days per week: Not on file    Minutes per session: Not on file  . Stress: Not on file  Relationships  . Social Herbalist on phone: Not on file    Gets together: Not on file    Attends religious service: Not on file    Active member of club or organization: Not on file    Attends meetings of clubs or  organizations: Not on file    Relationship status: Not on file  . Intimate partner violence    Fear of current or ex partner: Not on file    Emotionally abused: Not on file    Physically abused: Not on file    Forced sexual activity: Not on file  Other Topics Concern  . Not on file  Social History Narrative  . Not on file    Family History  Problem Relation Age of Onset  . Alcohol abuse Father   . Cancer Father   . Breast cancer Neg Hx     The following portions of the patient's history were reviewed and updated as appropriate: allergies, current medications, past family history, past medical history, past social history, past surgical history and problem list.  Review of Systems Pertinent items are noted in HPI.   Objective:  BP 137/71 (BP Location: Left Arm)   Pulse 80   Temp 98 F (36.7 C)   Ht 5\' 8"  (1.727 m)   Wt 124.2 kg   LMP  (LMP Unknown)   BMI 41.63 kg/m  CONSTITUTIONAL: Well-developed, well-nourished female in no acute distress.  HENT:  Normocephalic, atraumatic, External right and left ear normal. Oropharynx is clear and moist EYES: Conjunctivae and EOM are normal. Pupils are equal, round, and reactive to light. No scleral icterus.  NECK: Normal range of motion, supple, no masses.  Normal thyroid.  SKIN: Skin is warm and dry. No rash noted. Not diaphoretic. No erythema. No pallor. Julian: Alert and oriented to person, place, and time. Normal reflexes, muscle tone coordination. No cranial nerve deficit noted. PSYCHIATRIC: Normal mood and affect. Normal behavior. Normal judgment and thought content. CARDIOVASCULAR: Normal heart rate noted, regular rhythm RESPIRATORY: Clear to auscultation bilaterally. Effort and breath sounds normal, no problems with respiration noted. BREASTS: Deffered. ABDOMEN: Soft, normal bowel sounds, no distention noted.  No tenderness, rebound or guarding.  PELVIC: Normal appearing external genitalia; vaginal mucosa atrophic cervix  and uterus surgerically absent, vaginal cuff smear obtained, no adnexal masses or tenderness  MUSCULOSKELETAL: Normal range of motion. No tenderness.  No cyanosis, clubbing, or edema.  2+ distal pulses.   Assessment:  Annual gynecologic examination with vaginal cuff smear Vaginal atrophy   Plan:  Discussed treatment options for vaginal atrophy. Will try vaginal Premarin cream. U/R/B reviewed.  Mammogram as per PCP Routine preventative health maintenance measures emphasized. Will follow up with the pt in 2 months to eval response to Premarin cream.  Please refer to After Visit Summary for other counseling recommendations.    Chancy Milroy, MD,  [place] Attending [place] for [employer], [place]

## 2019-04-26 NOTE — Telephone Encounter (Signed)
The patient called in to request a call back to go over test results with a nurse. She would like to know if we have the results.

## 2019-04-27 ENCOUNTER — Telehealth: Payer: Self-pay | Admitting: Family Medicine

## 2019-04-27 LAB — CYTOLOGY - PAP
Diagnosis: HIGH — AB
HPV 16/18/45 genotyping: POSITIVE — AB
HPV: DETECTED — AB

## 2019-04-27 NOTE — Telephone Encounter (Signed)
Called patient, no answer- left message stating we are trying to reach you to return your phone call. Dr Rip Harbour still has not reviewed your results yet, whenever he reviewed your results we will call and let you know. The lab is a little behind with the holiday so it may not be until later this week. You may call us back if you have other questions/concerns.

## 2019-04-27 NOTE — Telephone Encounter (Signed)
Patient called requesting her pap results. She would like a call back today from clinical staff about this.

## 2019-05-03 ENCOUNTER — Telehealth (INDEPENDENT_AMBULATORY_CARE_PROVIDER_SITE_OTHER): Payer: Medicaid Other | Admitting: Obstetrics & Gynecology

## 2019-05-03 DIAGNOSIS — R87613 High grade squamous intraepithelial lesion on cytologic smear of cervix (HGSIL): Secondary | ICD-10-CM

## 2019-05-03 NOTE — Telephone Encounter (Signed)
Dr Rip Harbour is out of the office until next week and is thus unable to review results. Had Dr Kennon Rounds review results who states patient needs colposcopy.   Called patient, no answer- left message stating we are trying to reach you to return your phone call please call us back.

## 2019-05-03 NOTE — Telephone Encounter (Signed)
The patient called in stating she would like her results and it is important that someone call her back today.

## 2019-05-04 ENCOUNTER — Encounter (HOSPITAL_COMMUNITY): Payer: Self-pay | Admitting: Emergency Medicine

## 2019-05-04 ENCOUNTER — Other Ambulatory Visit: Payer: Self-pay

## 2019-05-04 ENCOUNTER — Telehealth: Payer: Self-pay | Admitting: Family Medicine

## 2019-05-04 ENCOUNTER — Emergency Department (HOSPITAL_COMMUNITY): Payer: Medicaid Other

## 2019-05-04 ENCOUNTER — Emergency Department (HOSPITAL_COMMUNITY)
Admission: EM | Admit: 2019-05-04 | Discharge: 2019-05-04 | Discharge status: Against Medical Advice | Payer: Medicaid Other | Attending: Emergency Medicine | Admitting: Emergency Medicine

## 2019-05-04 DIAGNOSIS — Z5321 Procedure and treatment not carried out due to patient leaving prior to being seen by health care provider: Secondary | ICD-10-CM | POA: Diagnosis not present

## 2019-05-04 DIAGNOSIS — R0789 Other chest pain: Secondary | ICD-10-CM | POA: Diagnosis present

## 2019-05-04 LAB — CBC
HCT: 42.5 % (ref 36.0–46.0)
Hemoglobin: 14.3 g/dL (ref 12.0–15.0)
MCH: 31.4 pg (ref 26.0–34.0)
MCHC: 33.6 g/dL (ref 30.0–36.0)
MCV: 93.4 fL (ref 80.0–100.0)
Platelets: 183 10*3/uL (ref 150–400)
RBC: 4.55 MIL/uL (ref 3.87–5.11)
RDW: 12.2 % (ref 11.5–15.5)
WBC: 5.5 10*3/uL (ref 4.0–10.5)
nRBC: 0 % (ref 0.0–0.2)

## 2019-05-04 LAB — BASIC METABOLIC PANEL
Anion gap: 10 (ref 5–15)
BUN: 9 mg/dL (ref 6–20)
CO2: 24 mmol/L (ref 22–32)
Calcium: 9.1 mg/dL (ref 8.9–10.3)
Chloride: 105 mmol/L (ref 98–111)
Creatinine, Ser: 0.82 mg/dL (ref 0.44–1.00)
GFR calc Af Amer: >60 mL/min (ref >60)
GFR calc non Af Amer: >60 mL/min (ref >60)
Glucose, Bld: 90 mg/dL (ref 70–99)
Potassium: 4 mmol/L (ref 3.5–5.1)
Sodium: 139 mmol/L (ref 135–145)

## 2019-05-04 LAB — I-STAT BETA HCG BLOOD, ED (MC, WL, AP ONLY): I-stat hCG, quantitative: <5 m[IU]/mL (ref <5)

## 2019-05-04 LAB — TROPONIN I (HIGH SENSITIVITY): Troponin I (High Sensitivity): <2 ng/L (ref <18)

## 2019-05-04 MED ORDER — SODIUM CHLORIDE 0.9% FLUSH: 3.0000 mL | Freq: Once | INTRAVENOUS | Status: DC

## 2019-05-04 NOTE — ED Triage Notes (Signed)
Pt presents to ED  Via [facility] for right sided  CP  That began 1 hr ago. Pt took 1500 mg tylenol without relief, called EMS. Denies SOB, abd pain, dizziness.

## 2019-05-04 NOTE — Telephone Encounter (Signed)
Patient called in stating that she had a pap done on 7/1 and is wanting the results. Patient stated that someone had called her with the results but was not able to get to her phone. She would like someone to give her a call back with the results.

## 2019-05-04 NOTE — Telephone Encounter (Signed)
Patient called in stated that a nurse had tried to call her but she missed the call. Patient stated she would like to speak to the nurse that just called her. Morey Hummingbird was busy with another call and stated that the patient could wait on hold or she could call the patient back. Patient verbalized that Morey Hummingbird could call her back. Morey Hummingbird was notified about calling the patient back.

## 2019-05-04 NOTE — ED Notes (Signed)
Pt reports she wants to leave, this Rn encouraged the pt to stay but she refused. Armband removed, pt seen leaving the ED

## 2019-05-04 NOTE — Telephone Encounter (Signed)
Called patient, no answer- left message stating we are trying to reach you to return your phone call, please call us back

## 2019-05-05 ENCOUNTER — Telehealth: Payer: Self-pay | Admitting: Medical

## 2019-05-05 NOTE — Telephone Encounter (Addendum)
Returned pts call  with her results. She apologized for not answering phone yesterday.   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 It is recommended that she come in for a Colposcopy. Pt reported she thought she has had a complete Hysterectomy although Dr. Rip Harbour told her that her female anatomy was still there. Pt seemed to recall she has another Colposcopy and reports the procedure had to be done in the hospital as OP Surgery.   Pt was upset and we discussed HPV Is common and that only certain kinds typically cause cancer and the Colposcopy is to detect if pt has precancerous or cancerous lesions and what follow up is needed. She asked about treatments, we discussed sometimes it clears on its own and other times it requires a medication or removal of the lesions and that is what the Colposcopy will help determine.   Pt asked how she may have gotten the virus, discussed it is often sexually transmitted. Pt reports her partner is in a nursing home and she has not been sexually active in many years.  Pt would like to see Dr. Rip Harbour for Procedure. Discussed with her that I will send a message to the front office to get pt scheduled for procedure and she will get a call with the appointment time and date. Pt voiced understanding.

## 2019-05-05 NOTE — Telephone Encounter (Signed)
The patient stated she would like her results. Please call her back.

## 2019-05-17 ENCOUNTER — Telehealth: Payer: Self-pay | Admitting: Family Medicine

## 2019-05-17 NOTE — Telephone Encounter (Signed)
Patient stated she was given some vaginal cream, said when she use it she feel something hard down there, patient want a call back

## 2019-05-18 ENCOUNTER — Telehealth (INDEPENDENT_AMBULATORY_CARE_PROVIDER_SITE_OTHER): Payer: Medicaid Other | Admitting: Lactation Services

## 2019-05-18 ENCOUNTER — Telehealth (HOSPITAL_COMMUNITY): Payer: Self-pay | Admitting: Lactation Services

## 2019-05-18 DIAGNOSIS — Z712 Person consulting for explanation of examination or test findings: Secondary | ICD-10-CM

## 2019-05-18 DIAGNOSIS — R87629 Unspecified abnormal cytological findings in specimens from vagina: Secondary | ICD-10-CM

## 2019-05-18 NOTE — Telephone Encounter (Signed)
Pt called the office with concerns that when she inserts her Premarin cream with her applicator she notices something hard in the area. She is concerned with the hardened area. Pt reports she had a Hysterectomy previously.   Pt reports she has a UTI and has been treated for a UTI recently. She reports she is noting an odor to her vaginal area that she had had for several years. She denies any vaginal discharge. Pt reports that "it smells like something crawled up in there and died" She denies bleeding or pain to the area. She is following up with her GP for the UTI.   Pt aware that she has a Colposcopy scheduled for 8/7 @ 09:15 for Colposcopy.

## 2019-05-28 ENCOUNTER — Encounter: Payer: Self-pay | Admitting: Obstetrics and Gynecology

## 2019-05-28 ENCOUNTER — Other Ambulatory Visit (HOSPITAL_COMMUNITY)
Admission: RE | Admit: 2019-05-28 | Discharge: 2019-05-28 | Disposition: A | Discharge status: Home / Self Care | Payer: Medicaid Other | Source: Ambulatory Visit | Attending: Obstetrics and Gynecology | Admitting: Obstetrics and Gynecology

## 2019-05-28 ENCOUNTER — Ambulatory Visit (INDEPENDENT_AMBULATORY_CARE_PROVIDER_SITE_OTHER): Payer: Medicaid Other | Admitting: Obstetrics and Gynecology

## 2019-05-28 ENCOUNTER — Other Ambulatory Visit: Payer: Self-pay

## 2019-05-28 DIAGNOSIS — N893 Dysplasia of vagina, unspecified: Secondary | ICD-10-CM | POA: Diagnosis present

## 2019-05-28 DIAGNOSIS — C52 Malignant neoplasm of vagina: Secondary | ICD-10-CM | POA: Diagnosis present

## 2019-05-28 DIAGNOSIS — Z859 Personal history of malignant neoplasm, unspecified: Secondary | ICD-10-CM | POA: Insufficient documentation

## 2019-05-28 LAB — POCT PREGNANCY, URINE: Preg Test, Ur: NEGATIVE

## 2019-05-28 NOTE — Patient Instructions (Signed)
Colposcopy, Care After This sheet gives you information about how to care for yourself after your procedure. Your doctor may also give you more specific instructions. If you have problems or questions, contact your doctor. What can I expect after the procedure? If you did not have a tissue sample removed (did not have a biopsy), you may only have some spotting for a few days. You can go back to your normal activities. If you had a tissue sample removed, it is common to have:  Soreness and pain. This may last for a few days.  Light-headedness.  Mild bleeding from your vagina or dark-colored, grainy discharge from your vagina. This may last for a few days. You may need to wear a sanitary pad.  Spotting for at least 48 hours after the procedure. Follow these instructions at home:   Take over-the-counter and prescription medicines only as told by your doctor. Ask your doctor what medicines you can start taking again. This is very important if you take blood-thinning medicine.  Do not drive or use heavy machinery while taking prescription pain medicine.  For 3 days, or as long as your doctor tells you, avoid: ? Douching. ? Using tampons. ? Having sex.  If you use birth control (contraception), keep using it.  Limit activity for the first day after the procedure. Ask your doctor what activities are safe for you.  It is up to you to get the results of your procedure. Ask your doctor when your results will be ready.  Keep all follow-up visits as told by your doctor. This is important. Contact a doctor if:  You get a skin rash. Get help right away if:  You are bleeding a lot from your vagina. It is a lot of bleeding if you are using more than one pad an hour for 2 hours in a row.  You have clumps of blood (blood clots) coming from your vagina.  You have a fever.  You have chills  You have pain in your lower belly (pelvic area).  You have signs of infection, such as vaginal  discharge that is: ? Different than usual. ? Yellow. ? Bad-smelling.  You have very pain or cramps in your lower belly that do not get better with medicine.  You feel light-headed.  You feel dizzy.  You pass out (faint). Summary  If you did not have a tissue sample removed (did not have a biopsy), you may only have some spotting for a few days. You can go back to your normal activities.  If you had a tissue sample removed, it is common to have mild pain and spotting for 48 hours.  For 3 days, or as long as your doctor tells you, avoid douching, using tampons and having sex.  Get help right away if you have bleeding, very bad pain, or signs of infection. This information is not intended to replace advice given to you by your health care provider. Make sure you discuss any questions you have with your health care provider. Document Released: 03/25/2008 Document Revised: 09/19/2017 Document Reviewed: 06/26/2016 Elsevier Patient Education  2020 Elsevier Inc.  

## 2019-05-28 NOTE — Progress Notes (Signed)
    GYNECOLOGY CLINIC COLPOSCOPY PROCEDURE NOTE  53 y.o. G1P1001 here for colposcopy for VAIN 2-3 pap smear on 03/2019. Discussed role for HPV in vaginal dysplasia, need for surveillance.  Patient given informed consent, signed copy in the chart, time out was performed.  Placed in lithotomy position. Cervix viewed with speculum and colposcope after application of acetic acid.   Colposcopy adequate? Yes  Findings, vaginal atrophy noted, no visible lesions seen, min ACW changes noted after acetic acid applied. Bx obtained. Monsel's applied. Hemostasis noted. Pt tolerated procedure well. All specimens were labelled and sent to pathology.   Patient was given post procedure instructions.  Will follow up pathology and manage accordingly.  Routine preventative health maintenance measures emphasized.    Arlina Robes, MD, [place] Attending [place] for [place]

## 2019-05-28 NOTE — Addendum Note (Signed)
Addended by: Bethanne Ginger on: 05/28/2019 10:49 AM   Modules accepted: Orders

## 2019-06-01 ENCOUNTER — Telehealth: Payer: Self-pay | Admitting: Obstetrics and Gynecology

## 2019-06-01 ENCOUNTER — Telehealth: Payer: Self-pay | Admitting: Family Medicine

## 2019-06-01 ENCOUNTER — Encounter: Payer: Self-pay | Admitting: *Deleted

## 2019-06-01 NOTE — Telephone Encounter (Signed)
Requesting a call back from the clinical staff today.

## 2019-06-01 NOTE — Telephone Encounter (Signed)
Called patient and advised that her results for her colposcopy has not came in and that we will give her a call back by a provider for when they are available. Patient  verbalized understanding.

## 2019-06-01 NOTE — Telephone Encounter (Signed)
The patient called in stating she would like a call back with her results as they are not available in [place].

## 2019-06-04 ENCOUNTER — Telehealth: Payer: Self-pay

## 2019-06-04 NOTE — Telephone Encounter (Signed)
Pt reports that she spoke with Dr. Rip Harbour and he told her that she has cancer and her daughter wants to know what is going on.  I explained to pt that the appt that she will have scheduled with [name] will be able to answer all her concerns.  Pt stated that the Dr. Rip Harbour said that if she doesn't hear back from [name] office by Monday to please give the office a call.  I advised pt to do the same- wait until end of day Monday and if she does not hear back from them to please give our office a call back on Tuesday.  Pt verbalized understanding.   Mel Almond, RN 06/04/19

## 2019-06-07 ENCOUNTER — Telehealth: Payer: Self-pay | Admitting: General Practice

## 2019-06-07 DIAGNOSIS — C52 Malignant neoplasm of vagina: Secondary | ICD-10-CM

## 2019-06-07 NOTE — Telephone Encounter (Signed)
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 Thanks Legrand Como

## 2019-06-07 NOTE — Telephone Encounter (Signed)
Patient called into front office regarding appointment information with the specialist. Scheduled appt with [facility] 8/21 @ 830. Called patient and provided appt information. Patient verbalized understanding and asked if she had cancer. Told patient yes that is correct. She states before she just had precancer. Told patient that is correct but now she has cancer. Patient verbalized understanding and asked who the specialist was. Told patient it is a gynecologist who has received training/education in treating cancers for women. Patient verbalized understanding and asked what would happen at the appt. Told her they would talk to her about her results, what it means, and discuss treatment options. Patient verbalized understanding and had no other questions.

## 2019-06-11 ENCOUNTER — Encounter: Payer: Self-pay | Admitting: Gynecologic Oncology

## 2019-06-11 ENCOUNTER — Inpatient Hospital Stay: Payer: Medicaid Other | Attending: Gynecologic Oncology | Admitting: Gynecologic Oncology

## 2019-06-11 ENCOUNTER — Other Ambulatory Visit: Payer: Self-pay

## 2019-06-11 VITALS — BP 111/65 | HR 79 | Temp 98.2°F | Resp 18 | Ht 68.0 in | Wt 271.6 lb

## 2019-06-11 DIAGNOSIS — Z90710 Acquired absence of both cervix and uterus: Secondary | ICD-10-CM | POA: Diagnosis not present

## 2019-06-11 DIAGNOSIS — Z923 Personal history of irradiation: Secondary | ICD-10-CM | POA: Diagnosis not present

## 2019-06-11 DIAGNOSIS — F1010 Alcohol abuse, uncomplicated: Secondary | ICD-10-CM | POA: Insufficient documentation

## 2019-06-11 DIAGNOSIS — F603 Borderline personality disorder: Secondary | ICD-10-CM | POA: Diagnosis not present

## 2019-06-11 DIAGNOSIS — G893 Neoplasm related pain (acute) (chronic): Secondary | ICD-10-CM | POA: Diagnosis not present

## 2019-06-11 DIAGNOSIS — Z6841 Body Mass Index (BMI) 40.0 and over, adult: Secondary | ICD-10-CM | POA: Insufficient documentation

## 2019-06-11 DIAGNOSIS — C52 Malignant neoplasm of vagina: Secondary | ICD-10-CM | POA: Diagnosis present

## 2019-06-11 DIAGNOSIS — F319 Bipolar disorder, unspecified: Secondary | ICD-10-CM | POA: Diagnosis not present

## 2019-06-11 NOTE — Patient Instructions (Signed)
Dr Denman George has performed an examination. It showed abnormal discoloration at the top of the vagina measuring approximately 2 inches. There were no masses or growths seen or felt. 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  She is recommending a PET scan to look for obvious spread of the cancer elsewhere in the body.  She will see you back in the office after you have completed the scan to discuss the results and treatment plan.  Please have your daughter on speaker phone at that visit if you desire her to hear about your diagnosis and treatment plan.   Dr Serita Grit office can be called at [phone].

## 2019-06-11 NOTE — Progress Notes (Signed)
Consult Note: Gyn-Onc  Consult was requested by Dr. Rip Harbour for the evaluation of Jordan [name] 53 y.o. female  CC:  Chief Complaint  Patient presents with  . Squamous Cell Carcinoma    Vaginal    Assessment/Plan:  Jordan. Jordan [name]  is a 53 y.o.  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  We will obtain staging PET/CT. If confined to the vagina, options will include upper vaginectomy versus pelvic RT with vaginal brachytherapy.   HPI: Jordan [name] is a 53 year old P1 who is seen in consultation at the request of Dr Rip Harbour for squamous cell carcinoma of the vulva.  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  A follow-up Pap smear in 2018 showed H SIL.  She then underwent a vaginal hysterectomy which confirmed residual CIN-3 with negative margins at the hysterectomy specimen.  She underwent a Pap test for surveillance in April 21, 2019 which revealed the AIN 2-3, H SIL.  Subsequently a vaginal colposcopy was performed on May 28, 2019.  No visible lesions were seen.  There was minimal acetowhite changes noted after acetic acid was applied that was not noted which location.  Biopsies were obtained from the vagina.  Final pathology revealed poorly differentiated squamous cell carcinoma.  The tumor cells were positive for CK 5 6, p63, and P 16.  The patient has a history of bipolar disorder.  She has been treated with medical therapies for this.  She has a history of obesity with a BMI of 41 kg/m.  The patient is a chronic tobacco user.  She has a history of PICC line infection with sepsis due to gram-negative bacteria.  She has had one prior vaginal delivery.  She is currently not employed.   Current Meds:  Outpatient Encounter Medications as of 06/11/2019  Medication Sig  . acetaminophen (TYLENOL) 500 MG tablet Take 1,000 mg by mouth every 6 (six) hours as needed for moderate pain.  .  nitrofurantoin, macrocrystal-monohydrate, (MACROBID) 100 MG capsule Take 100 mg by mouth at bedtime.  . Oxycodone HCl 10 MG TABS Take 10 mg by mouth every 6 (six) hours as needed for pain.  Marland Kitchen conjugated estrogens (PREMARIN) vaginal cream Place 1 Applicatorful vaginally 2 (two) times a week. (Patient not taking: Reported on 06/11/2019)  . cyclobenzaprine (FLEXERIL) 5 MG tablet Take 1 tablet (5 mg total) by mouth 3 (three) times daily as needed for muscle spasms. (Patient not taking: Reported on 05/28/2019)  . Vitamin D, Ergocalciferol, (DRISDOL) 1.25 MG (50000 UT) CAPS capsule Take 50,000 Units by mouth once a week.   No facility-administered encounter medications on file as of 06/11/2019.     Allergy:  Allergies  Allergen Reactions  . Ciprofloxacin Swelling    Lips swell, tongue swells, face swells  . Citalopram Other (See Comments)    Possible cause of pancytopenia. Swelling of tongue, face and throat  . Lamotrigine Other (See Comments)    Possible cause of pancytopenia. Swelling of face, throat and tongue  . Sulfa Antibiotics Anaphylaxis  . Tramadol Anaphylaxis, Shortness Of Breath and Swelling    Social Hx:   Social History   Socioeconomic History  . Marital status: Soil scientist    Spouse name: Not on file  . Number of children: Not on file  . Years of education: Not on file  . Highest education level: Not on file  Occupational History  . Not  on file  Social Needs  . Financial resource strain: Not on file  . Food insecurity    Worry: Not on file    Inability: Not on file  . Transportation needs    Medical: Not on file    Non-medical: Not on file  Tobacco Use  . Smoking status: Never Smoker  . Smokeless tobacco: Never Used  Substance and Sexual Activity  . Alcohol use: No  . Drug use: No  . Sexual activity: Yes    Birth control/protection: Post-menopausal, Surgical    Comment: perimenopausal; no sex in years  Lifestyle  . Physical activity    Days per week: Not  on file    Minutes per session: Not on file  . Stress: Not on file  Relationships  . Social Herbalist on phone: Not on file    Gets together: Not on file    Attends religious service: Not on file    Active member of club or organization: Not on file    Attends meetings of clubs or organizations: Not on file    Relationship status: Not on file  . Intimate partner violence    Fear of current or ex partner: Not on file    Emotionally abused: Not on file    Physically abused: Not on file    Forced sexual activity: Not on file  Other Topics Concern  . Not on file  Social History Narrative  . Not on file    Past Surgical Hx:  Past Surgical History:  Procedure Laterality Date  . ABDOMINAL HYSTERECTOMY    . CERVICAL CONIZATION W/BX N/A 07/01/2016   Procedure: CONIZATION CERVIX WITH BIOPSY;  Surgeon: Chancy Milroy, MD;  Location: [facility];  Service: Gynecology;  Laterality: N/A;  . CHOLECYSTECTOMY    . HYSTEROSCOPY W/D&C N/A 01/25/2016   Procedure: DILATATION AND CURETTAGE /HYSTEROSCOPY;  Surgeon: Mora Bellman, MD;  Location: [facility];  Service: Gynecology;  Laterality: N/A;  . VAGINAL HYSTERECTOMY N/A 03/11/2017   Procedure: HYSTERECTOMY VAGINAL WITH MORCELLATION;  Surgeon: Chancy Milroy, MD;  Location: [facility];  Service: Gynecology;  Laterality: N/A;    Past Medical Hx:  Past Medical History:  Diagnosis Date  . Alcohol abuse   . Anemia    patient denies  . Bipolar 1 disorder (HCC)    No medications currently  . CAP (community acquired pneumonia) 03/17/2015  . 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 Mental disorder   . Obesity   . PICC line infection 05/17/2015  . Sepsis due to Gram negative bacteria (MDR E Coli) 02/18/2015  . UTI (lower urinary tract infection)   . Vaginal Pap smear, abnormal     Past Gynecological History:  See HPI No LMP recorded (lmp unknown). Patient has had a hysterectomy.  Family Hx:  Family History  Problem Relation  Age of Onset  . Alcohol abuse Father   . Cancer Father   . Breast cancer Neg Hx     Review of Systems:  Constitutional  Feels well,    ENT Normal appearing ears and nares bilaterally Skin/Breast  No rash, sores, jaundice, itching, dryness Cardiovascular  No chest pain, shortness of breath, or edema  Pulmonary  No cough or wheeze.  Gastro Intestinal  No nausea, vomitting, or diarrhoea. No bright red blood per rectum, no abdominal pain, change in bowel movement, or constipation.  Genito Urinary  No frequency, urgency, dysuria, no bleeding or discharge. Not sexually active Musculo  Skeletal  No myalgia, arthralgia, joint swelling or pain  Neurologic  No weakness, numbness, change in gait,  Psychology  No depression, anxiety, insomnia.   Vitals:  Blood pressure 111/65, pulse 79, temperature 98.2 F (36.8 C), temperature source Oral, resp. rate 18, height 5\' 8"  (1.727 m), weight 271 lb 9.6 oz (123.2 kg), SpO2 100 %.  Physical Exam: WD in NAD Neck  Supple NROM, without any enlargements.  Lymph Node Survey No cervical supraclavicular or inguinal adenopathy Cardiovascular  Pulse normal rate, regularity and rhythm. S1 and S2 normal.  Lungs  Clear to auscultation bilateraly, without wheezes/crackles/rhonchi. Good air movement.  Skin  No rash/lesions/breakdown  Psychiatry  Alert and oriented to person, place, and time  Abdomen  Normoactive bowel sounds, abdomen soft, non-tender and obese without evidence of hernia.  Back No CVA tenderness Genito Urinary  Vulva/vagina: Normal external female genitalia.  No lesions. No discharge or bleeding.  Bladder/urethra:  No lesions or masses, well supported bladder  Vagina: There is no gross exophytic lesions present.  At the left vaginal cuff there is some erythematous change which is magnified with application of acetic acid.  There is no exophytic lesion.  The vaginal mucosa is palpably normal.  There is no palpable infiltration into  the paravaginal tissues.  Cervix and uterus surgically absent.  Adnexa: no palpable masses. Rectal  No palpaple nodularity or parametrial/paravaginal induration or infiltration Extremities  No bilateral cyanosis, clubbing or edema.   Thereasa Solo, MD  06/11/2019, 9:33 AM

## 2019-06-14 ENCOUNTER — Other Ambulatory Visit: Payer: Self-pay | Admitting: Gynecologic Oncology

## 2019-06-14 DIAGNOSIS — D4989 Neoplasm of unspecified behavior of other specified sites: Secondary | ICD-10-CM

## 2019-06-14 NOTE — Progress Notes (Signed)
Peer to peer performed for PET scan.  Standard imaging needed first.  CT AP and MRI of pelvis ordered per [name].

## 2019-06-16 ENCOUNTER — Telehealth: Payer: Self-pay | Admitting: *Deleted

## 2019-06-16 NOTE — Telephone Encounter (Signed)
Called and spoke with the daughter, gave the appts for Friday. Explained to the daughter to have the patient call her the day of the appt

## 2019-06-17 ENCOUNTER — Telehealth: Payer: Self-pay | Admitting: *Deleted

## 2019-06-17 NOTE — Telephone Encounter (Signed)
LM for Jordan [name] to call back to disuss her abdominal pain.

## 2019-06-17 NOTE — Telephone Encounter (Signed)
Patient called back and was asking "Can I make an appointment to come in today to see [name] for the vineger test results from last week. I'm still waiting on them." Explained the [name] was in the OR today, but I would leave her a message with her request. Patient stated "but she is suppose to talk to [facility] about my tests." Explained that we are to talk with Medicaid this afternoon after [name] finished with the OR. Again explained that I would call her at 4pm to update her.

## 2019-06-17 NOTE — Telephone Encounter (Signed)
Patient called regarding her scans and authorization. Explained to the patient that the CT scan was just authorized and the MRI is still in MD review, but to keep going forward as if she will have the MRI. Patient stated "CT just called me and said they were going to cancel the test and I told them no." Explained that we just send the information for the authorization and the scan is still scheduled. Per the patient request I have called and spoke with the daughter and boyfriend. Explained to the boyfriend and daughter the above.   Called and spoke with [facility], explained that the scan is in MD review with [employer]. Scheduler stated  that she had placed a note with the above information. Scheduler stated that Delana Meyer will see the message and would be the one to call the patient to cancel. Ask that [facility] call our office first before canceling the scan. Scheduler stated that the patient would not be called today.

## 2019-06-17 NOTE — Telephone Encounter (Signed)
Patient called and stated "I have vaginal cancer and I am having cramping down there. Having pain too." Patient stated that she has no bleeding and her pain level was a 10. Patient was crying on the phone. Explained that the nurse will call her back

## 2019-06-17 NOTE — Telephone Encounter (Signed)
Late entry----06/14/2019 patient called multiple times on Monday regarding her PET scan. Patient wanting to know if the PET scan would be authorized. Explained the patient that the office was in the process of doing a peer to peer with the insurance company and call her back. Patient called multiple times in th day. Patient stated "Well I have cancer, I need the test to see if it spread. If this isn't approved I'm coming up there and going off." Peer to peer was denied, but able to schedule a CT and MRI scan. Scheduled appts and call the patient. Explained that the insurance would not pay for the PET scan, but [name] has scheduled a CT and MRI. Gave the patient the information for tests with date/time/place. Patient requested that we call her boyfriend and daughter. Per patient requests call made to the boyfriend and daughter. Spoke with the boyfriend and explained "we are in the process of getting a CT and MRI scan. Jahanna has the date and times of the appts for Friday. She wanted to let you know. When you she her ask her to show the appts, she wrote down the appts." Left a message for the daughter to call the office.   06/16/2019----Daughter called the office regarding her mother. Explained that we are in the process of getting a CT and MRI scan. Gave the daughter the date and times, along with the places/instructions. Ask that she call the patient on Thursday to remind her about the appts. Explained that I would also call her regarding the appts.    06/17/2019----Today the patient has called multiple times regarding her scans for tomorrow and if insurance has authorized them. Explained to the patient that we were in the process of getting the authorization and talking with the insurance company. Explained that I would call hr back by 4pm with an answer regarding the authorization.

## 2019-06-17 NOTE — Telephone Encounter (Signed)
Patient has called back twice leaving messages that she is having cramping and pain. Patient crying in the message. Message given to the desk RN

## 2019-06-18 ENCOUNTER — Ambulatory Visit (HOSPITAL_COMMUNITY)
Admission: RE | Admit: 2019-06-18 | Discharge: 2019-06-18 | Disposition: A | Discharge status: Home / Self Care | Payer: Medicaid Other | Source: Ambulatory Visit | Attending: Gynecologic Oncology | Admitting: Gynecologic Oncology

## 2019-06-18 ENCOUNTER — Other Ambulatory Visit: Payer: Self-pay

## 2019-06-18 ENCOUNTER — Ambulatory Visit: Admission: RE | Admit: 2019-06-18 | Payer: Medicaid Other | Source: Ambulatory Visit

## 2019-06-18 ENCOUNTER — Ambulatory Visit (HOSPITAL_COMMUNITY): Payer: Medicaid Other

## 2019-06-18 DIAGNOSIS — D4989 Neoplasm of unspecified behavior of other specified sites: Secondary | ICD-10-CM | POA: Diagnosis present

## 2019-06-18 MED ORDER — IOHEXOL 300 MG/ML  SOLN
30.0000 mL | Freq: Once | INTRAMUSCULAR | Status: AC | PRN
  Administered 2019-06-18: 30 mL via ORAL

## 2019-06-18 MED ORDER — IOPAMIDOL (ISOVUE-300) INJECTION 61%: 100.0000 mL | Freq: Once | INTRAVENOUS | Status: DC | PRN

## 2019-06-18 MED ORDER — SODIUM CHLORIDE (PF) 0.9 % IJ SOLN
INTRAMUSCULAR | Status: AC
  Filled 2019-06-18: qty 50

## 2019-06-18 MED ORDER — IOHEXOL 300 MG/ML  SOLN
100.0000 mL | Freq: Once | INTRAMUSCULAR | Status: AC | PRN
  Administered 2019-06-18: 100 mL via INTRAVENOUS

## 2019-06-21 ENCOUNTER — Encounter: Payer: Self-pay | Admitting: Gynecologic Oncology

## 2019-06-21 ENCOUNTER — Inpatient Hospital Stay (HOSPITAL_BASED_OUTPATIENT_CLINIC_OR_DEPARTMENT_OTHER): Payer: Medicaid Other | Admitting: Gynecologic Oncology

## 2019-06-21 ENCOUNTER — Other Ambulatory Visit: Payer: Self-pay

## 2019-06-21 VITALS — BP 127/61 | HR 83 | Temp 98.3°F | Resp 18

## 2019-06-21 DIAGNOSIS — G893 Neoplasm related pain (acute) (chronic): Secondary | ICD-10-CM

## 2019-06-21 DIAGNOSIS — C52 Malignant neoplasm of vagina: Secondary | ICD-10-CM | POA: Diagnosis not present

## 2019-06-21 NOTE — Progress Notes (Signed)
Follow-up Note: Gyn-Onc  Consult was requested by Dr. Rip Harbour for the evaluation of Jordan [name] 53 y.o. female  CC:  Chief Complaint  Patient presents with  . Vaginal Cancer    Assessment/Plan:  [name]  is a 53 y.o.  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  We will obtain an MRI to better characterize whether there is paracolpos/parametrial involvement.  If present, the recommended treatment course would be pelvic and vaginal radiation.  If negative for paravaginal disease, I would recommend either:  1/ surgical excision with upper vaginectomy. 2/ vaginal radiation.   Given her poorly controlled borderline personality disorder, and morbid obesity, and chronic pain disorder with poorly controlled pain, I have concerns about engaging in surgical treatment for this patient.  I will see her back after the MRI and discuss options further.  HPI: [name] is a 53 year old P1 who is seen in consultation at the request of Dr Rip Harbour for squamous cell carcinoma of the vulva.  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  A follow-up Pap smear in 2018 showed H SIL.  She then underwent a vaginal hysterectomy which confirmed residual CIN-3 with negative margins at the hysterectomy specimen.  She underwent a Pap test for surveillance in April 21, 2019 which revealed the AIN 2-3, H SIL.  Subsequently a vaginal colposcopy was performed on May 28, 2019.  No visible lesions were seen.  There was minimal acetowhite changes noted after acetic acid was applied that was not noted which location.  Biopsies were obtained from the vagina.  Final pathology revealed poorly differentiated squamous cell carcinoma.  The tumor cells were positive for CK 5 6, p63, and P 16.  The patient has a history of bipolar disorder.  She has been treated with medical therapies for this.  She has a history of  obesity with a BMI of 41 kg/m.  The patient is a chronic tobacco user.  She has a history of PICC line infection with sepsis due to gram-negative bacteria.  She has had one prior vaginal delivery.  She is currently not employed.  Interval Hx:   On June 18, 2019 she underwent staging CT scan of the chest abdomen and pelvis.  This revealed that she was status post hysterectomy with no evidence of metastatic disease in the abdomen or pelvis or chest.  There was a 6 mm right lower lobe that was grossly unchanged from a 2017 CT scan felt to be benign.  A compression fracture was seen at L2 unchanged from prior radiographs.  An MRI is pending.   Current Meds:  Outpatient Encounter Medications as of 06/21/2019  Medication Sig  . acetaminophen (TYLENOL) 500 MG tablet Take 1,000 mg by mouth every 6 (six) hours as needed for moderate pain.  . nitrofurantoin, macrocrystal-monohydrate, (MACROBID) 100 MG capsule Take 100 mg by mouth at bedtime.  . Oxycodone HCl 10 MG TABS Take 10 mg by mouth every 6 (six) hours as needed for pain.  . Vitamin D, Ergocalciferol, (DRISDOL) 1.25 MG (50000 UT) CAPS capsule Take 50,000 Units by mouth once a week.  . [DISCONTINUED] conjugated estrogens (PREMARIN) vaginal cream Place 1 Applicatorful vaginally 2 (two) times a week. (Patient not taking: Reported on 06/11/2019)  . [DISCONTINUED] cyclobenzaprine (FLEXERIL) 5 MG tablet Take 1 tablet (5 mg total) by mouth 3 (three) times daily as needed for muscle spasms. (Patient not taking:  Reported on 05/28/2019)   No facility-administered encounter medications on file as of 06/21/2019.     Allergy:  Allergies  Allergen Reactions  . Ciprofloxacin Swelling    Lips swell, tongue swells, face swells  . Citalopram Other (See Comments)    Possible cause of pancytopenia. Swelling of tongue, face and throat  . Lamotrigine Other (See Comments)    Possible cause of pancytopenia. Swelling of face, throat and tongue  . Sulfa Antibiotics  Anaphylaxis  . Tramadol Anaphylaxis, Shortness Of Breath and Swelling    Social Hx:   Social History   Socioeconomic History  . Marital status: Soil scientist    Spouse name: Not on file  . Number of children: Not on file  . Years of education: Not on file  . Highest education level: Not on file  Occupational History  . Not on file  Social Needs  . Financial resource strain: Not on file  . Food insecurity    Worry: Not on file    Inability: Not on file  . Transportation needs    Medical: Not on file    Non-medical: Not on file  Tobacco Use  . Smoking status: Never Smoker  . Smokeless tobacco: Never Used  Substance and Sexual Activity  . Alcohol use: No  . Drug use: No  . Sexual activity: Yes    Birth control/protection: Post-menopausal, Surgical    Comment: perimenopausal; no sex in years  Lifestyle  . Physical activity    Days per week: Not on file    Minutes per session: Not on file  . Stress: Not on file  Relationships  . Social Herbalist on phone: Not on file    Gets together: Not on file    Attends religious service: Not on file    Active member of club or organization: Not on file    Attends meetings of clubs or organizations: Not on file    Relationship status: Not on file  . Intimate partner violence    Fear of current or ex partner: Not on file    Emotionally abused: Not on file    Physically abused: Not on file    Forced sexual activity: Not on file  Other Topics Concern  . Not on file  Social History Narrative  . Not on file    Past Surgical Hx:  Past Surgical History:  Procedure Laterality Date  . ABDOMINAL HYSTERECTOMY    . CERVICAL CONIZATION W/BX N/A 07/01/2016   Procedure: CONIZATION CERVIX WITH BIOPSY;  Surgeon: Chancy Milroy, MD;  Location: [facility];  Service: Gynecology;  Laterality: N/A;  . CHOLECYSTECTOMY    . HYSTEROSCOPY W/D&C N/A 01/25/2016   Procedure: DILATATION AND CURETTAGE /HYSTEROSCOPY;  Surgeon: Mora Bellman,  MD;  Location: [facility];  Service: Gynecology;  Laterality: N/A;  . VAGINAL HYSTERECTOMY N/A 03/11/2017   Procedure: HYSTERECTOMY VAGINAL WITH MORCELLATION;  Surgeon: Chancy Milroy, MD;  Location: [facility];  Service: Gynecology;  Laterality: N/A;    Past Medical Hx:  Past Medical History:  Diagnosis Date  . Alcohol abuse   . Anemia    patient denies  . Bipolar 1 disorder (HCC)    No medications currently  . CAP (community acquired pneumonia) 03/17/2015  . 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 Mental disorder   . Obesity   . PICC line infection 05/17/2015  . Sepsis due to Gram negative bacteria (MDR E Coli) 02/18/2015  . Squamous cell  carcinoma of vagina ([place])   . UTI (lower urinary tract infection)   . Vaginal Pap smear, abnormal     Past Gynecological History:  See HPI No LMP recorded (lmp unknown). Patient has had a hysterectomy.  Family Hx:  Family History  Problem Relation Age of Onset  . Alcohol abuse Father   . Cancer Father   . Breast cancer Neg Hx     Review of Systems:  Constitutional  Feels well,    ENT Normal appearing ears and nares bilaterally Skin/Breast  No rash, sores, jaundice, itching, dryness Cardiovascular  No chest pain, shortness of breath, or edema  Pulmonary  No cough or wheeze.  Gastro Intestinal  No nausea, vomitting, or diarrhoea. No bright red blood per rectum, no abdominal pain, change in bowel movement, or constipation.  Genito Urinary  No frequency, urgency, dysuria, no bleeding or discharge. Not sexually active Musculo Skeletal  No myalgia, arthralgia, joint swelling or pain  Neurologic  No weakness, numbness, change in gait,  Psychology  No depression, anxiety, insomnia.   Vitals:  Blood pressure 127/61, pulse 83, temperature 98.3 F (36.8 C), temperature source Oral, resp. rate 18, SpO2 100 %.  Physical Exam: WD in NAD Neck  Supple NROM, without any enlargements.  Lymph Node Survey No cervical  supraclavicular or inguinal adenopathy Cardiovascular  Pulse normal rate, regularity and rhythm. S1 and S2 normal.  Lungs  Clear to auscultation bilateraly, without wheezes/crackles/rhonchi. Good air movement.  Skin  No rash/lesions/breakdown  Psychiatry  Alert and oriented to person, place, and time  Abdomen  Normoactive bowel sounds, abdomen soft, non-tender and obese without evidence of hernia.  Back No CVA tenderness Genito Urinary  Vulva/vagina: Normal external female genitalia.  No lesions. No discharge or bleeding.  Bladder/urethra:  No lesions or masses, well supported bladder  Vagina: There is no gross exophytic lesions present.  At the left vaginal cuff there is some erythematous change which is magnified with application of acetic acid.  There is no exophytic lesion.  The vaginal mucosa is palpably normal.  There is no palpable infiltration into the paravaginal tissues.  Cervix and uterus surgically absent.  Adnexa: no palpable masses. Rectal  No palpaple nodularity or parametrial/paravaginal induration or infiltration Extremities  No bilateral cyanosis, clubbing or edema.   Thereasa Solo, MD  06/21/2019, 4:59 PM

## 2019-06-21 NOTE — Patient Instructions (Signed)
Dr Denman George has ordered an MRI to check if this is a stage 1 cancer or a stage 2 cancer.  If it is a stage 2 cancer we will refer you for radiation.  If it is a stage 1 cancer we can offer you:  - surgery with removal of the top half of the vagina. Depending upon the results of this surgery, you may need radiation afterwards. Or  - radiation to the vagina.   She will see you back after your MRI to discuss the results and options.  The fax number to the office is: [phone]

## 2019-06-21 NOTE — Progress Notes (Signed)
Peer to peer performed for MRI of the pelvis.  Auth# [id number] valid for 30 calendar days.

## 2019-06-22 ENCOUNTER — Telehealth: Payer: Self-pay | Admitting: Obstetrics and Gynecology

## 2019-06-22 ENCOUNTER — Telehealth (INDEPENDENT_AMBULATORY_CARE_PROVIDER_SITE_OTHER): Payer: Medicaid Other | Admitting: Obstetrics and Gynecology

## 2019-06-22 ENCOUNTER — Telehealth: Payer: Self-pay | Admitting: *Deleted

## 2019-06-22 DIAGNOSIS — R87629 Unspecified abnormal cytological findings in specimens from vagina: Secondary | ICD-10-CM

## 2019-06-22 NOTE — Telephone Encounter (Signed)
Patient called and stated she received a registered letter regarding her CT. Can I bring it up there for you see. Patient bringing letter to office

## 2019-06-22 NOTE — Telephone Encounter (Signed)
Received a call from Ms. Bas wanting to know why she has an appointment with Dr Rip Harbour if she has CA. Requesting a call back.

## 2019-06-22 NOTE — Telephone Encounter (Signed)
Patient want a call, she is worried because she was told she had cancer and there was nothing that can save her. She is very upset and would like to talk with a nurse.

## 2019-06-23 ENCOUNTER — Ambulatory Visit: Payer: Medicaid Other | Admitting: Gynecologic Oncology

## 2019-06-23 NOTE — Telephone Encounter (Signed)
Called pt back in regards to her questions. Pt wants to know why she needs to see Dr. Rip Harbour. Discussed Dr. Rip Harbour still is her gynecologist and will work along with Oncologist to treat her cancer.   Pt reports she has an appt this weekend for MRI to discuss if has spread and how to treat her cancer. She seems to have a good understanding of what appointments she has coming up and what the suggested follow up is. Pt reports [name] is hoping to be able to treat with radiation.   Pt reports she plans to come in to see Dr. Rip Harbour at her scheduled appointment time.

## 2019-06-26 ENCOUNTER — Other Ambulatory Visit: Payer: Self-pay

## 2019-06-26 ENCOUNTER — Ambulatory Visit
Admission: RE | Admit: 2019-06-26 | Discharge: 2019-06-26 | Disposition: A | Discharge status: Home / Self Care | Payer: Medicaid Other | Source: Ambulatory Visit | Attending: Gynecologic Oncology | Admitting: Gynecologic Oncology

## 2019-06-26 DIAGNOSIS — D4989 Neoplasm of unspecified behavior of other specified sites: Secondary | ICD-10-CM

## 2019-06-26 MED ORDER — GADOBENATE DIMEGLUMINE 529 MG/ML IV SOLN
20.0000 mL | Freq: Once | INTRAVENOUS | Status: AC | PRN
  Administered 2019-06-26: 20 mL via INTRAVENOUS

## 2019-06-28 ENCOUNTER — Emergency Department (HOSPITAL_COMMUNITY): Payer: Medicaid Other

## 2019-06-28 ENCOUNTER — Other Ambulatory Visit: Payer: Self-pay

## 2019-06-28 ENCOUNTER — Emergency Department (HOSPITAL_COMMUNITY)
Admission: EM | Admit: 2019-06-28 | Discharge: 2019-06-28 | Disposition: A | Discharge status: Home / Self Care | Payer: Medicaid Other | Attending: Emergency Medicine | Admitting: Emergency Medicine

## 2019-06-28 ENCOUNTER — Encounter (HOSPITAL_COMMUNITY): Payer: Self-pay | Admitting: Emergency Medicine

## 2019-06-28 DIAGNOSIS — Y929 Unspecified place or not applicable: Secondary | ICD-10-CM | POA: Insufficient documentation

## 2019-06-28 DIAGNOSIS — W57XXXA Bitten or stung by nonvenomous insect and other nonvenomous arthropods, initial encounter: Secondary | ICD-10-CM | POA: Insufficient documentation

## 2019-06-28 DIAGNOSIS — M79674 Pain in right toe(s): Secondary | ICD-10-CM | POA: Diagnosis present

## 2019-06-28 DIAGNOSIS — S90464A Insect bite (nonvenomous), right lesser toe(s), initial encounter: Secondary | ICD-10-CM | POA: Diagnosis not present

## 2019-06-28 DIAGNOSIS — Z79899 Other long term (current) drug therapy: Secondary | ICD-10-CM | POA: Diagnosis not present

## 2019-06-28 DIAGNOSIS — Y999 Unspecified external cause status: Secondary | ICD-10-CM | POA: Insufficient documentation

## 2019-06-28 DIAGNOSIS — Y939 Activity, unspecified: Secondary | ICD-10-CM | POA: Diagnosis not present

## 2019-06-28 DIAGNOSIS — S30867A Insect bite (nonvenomous) of anus, initial encounter: Secondary | ICD-10-CM

## 2019-06-28 MED ORDER — CEPHALEXIN 500 MG PO CAPS: 500.0000 mg | ORAL_CAPSULE | Freq: Two times a day (BID) | ORAL | 0 refills | Status: AC

## 2019-06-28 MED ORDER — ACETAMINOPHEN 325 MG PO TABS
650.0000 mg | ORAL_TABLET | Freq: Once | ORAL | Status: AC
  Administered 2019-06-28: 650 mg via ORAL
  Filled 2019-06-28: qty 2

## 2019-06-28 NOTE — ED Provider Notes (Signed)
[facility] DEPT Provider Note   CSN: [id number] Arrival date & time: 06/28/19  1354     History   Chief Complaint Chief Complaint  Patient presents with  . Toe Pain    HPI Jordan [name] is a 53 y.o. female.     53 y.o female with a PMH of Anemia, Alcohol abuse, bipolar presents to the ED via EMS with a chief complaint of right fifth metatarsal pain x 3 days. Patient reports she believe she was bite by an insect, states there is itching along with swelling to it. She reports pain with ambulation, and tenderness to palpation. No alleviating factors, has not tried any medication for relieve in symptoms. She denies any fever, IVDU, or trauma.   The history is provided by the patient and medical records.    Past Medical History:  Diagnosis Date  . Alcohol abuse   . Anemia    patient denies  . Bipolar 1 disorder (HCC)    No medications currently  . CAP (community acquired pneumonia) 03/17/2015  . 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 Mental disorder   . Obesity   . PICC line infection 05/17/2015  . Sepsis due to Gram negative bacteria (MDR E Coli) 02/18/2015  . 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 UTI (lower urinary tract infection)   . Vaginal Pap smear, abnormal     Patient Active Problem List   Diagnosis Date Noted  . Morbid obesity with BMI of 40.0-44.9, adult ([place]) 06/11/2019  . 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 Visit for routine gyn exam 04/21/2019  . Vaginal atrophy 04/21/2019  . Painful lumpy right breast 07/27/2016  . Urinary tract infection 07/01/2015  . Diastolic dysfunction [id number]  . Constipation 05/12/2015  . ESBL (extended spectrum beta-lactamase) producing bacteria infection 03/17/2015  . Diarrhea 03/03/2015  . Weakness 03/02/2015  . 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 Generalized anxiety disorder 02/20/2015  . Claustrophobia 02/20/2015  . Transaminitis 02/18/2015  .  Chest pain 02/18/2015  . Abdominal pain   . Dizziness   . Hypotension 01/04/2015  . Bipolar disorder ([place]) 06/29/2012    Past Surgical History:  Procedure Laterality Date  . ABDOMINAL HYSTERECTOMY    . CERVICAL CONIZATION W/BX N/A 07/01/2016   Procedure: CONIZATION CERVIX WITH BIOPSY;  Surgeon: Chancy Milroy, MD;  Location: [facility];  Service: Gynecology;  Laterality: N/A;  . CHOLECYSTECTOMY    . HYSTEROSCOPY W/D&C N/A 01/25/2016   Procedure: DILATATION AND CURETTAGE /HYSTEROSCOPY;  Surgeon: Mora Bellman, MD;  Location: [facility];  Service: Gynecology;  Laterality: N/A;  . VAGINAL HYSTERECTOMY N/A 03/11/2017   Procedure: HYSTERECTOMY VAGINAL WITH MORCELLATION;  Surgeon: Chancy Milroy, MD;  Location: [facility];  Service: Gynecology;  Laterality: N/A;     OB History    Gravida  1   Para  1   Term  1   Preterm      AB      Living  1     SAB      TAB      Ectopic      Multiple      Live Births  1            Home Medications    Prior to Admission medications   Medication Sig Start Date End Date Taking? Authorizing Provider  acetaminophen (TYLENOL) 500 MG tablet Take 1,000 mg by mouth every 6 (six) hours as needed for moderate  pain.    [provider]  cephALEXin (KEFLEX) 500 MG capsule Take 1 capsule (500 mg total) by mouth 2 (two) times daily for 7 days. 06/28/19 07/05/19  Janeece Fitting, PA-C  nitrofurantoin, macrocrystal-monohydrate, (MACROBID) 100 MG capsule Take 100 mg by mouth at bedtime.    [provider]  Oxycodone HCl 10 MG TABS Take 10 mg by mouth every 6 (six) hours as needed for pain. 01/07/19   [provider]  Vitamin D, Ergocalciferol, (DRISDOL) 1.25 MG (50000 UT) CAPS capsule Take 50,000 Units by mouth once a week. 02/01/19   [provider]    Family History Family History  Problem Relation Age of Onset  . Alcohol abuse Father   . Cancer Father   . Breast cancer Neg Hx     Social History Social History    Tobacco Use  . Smoking status: Never Smoker  . Smokeless tobacco: Never Used  Substance Use Topics  . Alcohol use: No  . Drug use: No     Allergies   Ciprofloxacin, Citalopram, Lamotrigine, Sulfa antibiotics, and Tramadol   Review of Systems Review of Systems  Constitutional: Negative for fever.  Skin: Positive for wound.     Physical Exam Updated Vital Signs Temp 98.7 F (37.1 C) (Oral)   Resp 18   Ht 5\' 8"  (1.727 m)   Wt 123 kg   LMP  (LMP Unknown)   BMI 41.23 kg/m   Physical Exam Vitals signs and nursing note reviewed.  Constitutional:      Appearance: Normal appearance.  HENT:     Head: Normocephalic and atraumatic.     Mouth/Throat:     Mouth: Mucous membranes are dry.  Eyes:     Pupils: Pupils are equal, round, and reactive to light.  Neck:     Musculoskeletal: Normal range of motion and neck supple.  Cardiovascular:     Rate and Rhythm: Normal rate.     Pulses:          Dorsalis pedis pulses are 2+ on the left side.       Posterior tibial pulses are 2+ on the left side.  Pulmonary:     Effort: Pulmonary effort is normal.     Breath sounds: Normal breath sounds. No wheezing, rhonchi or rales.  Abdominal:     General: Abdomen is flat. There is no distension.     Tenderness: There is no abdominal tenderness. There is no right CVA tenderness.  Musculoskeletal:        General: Tenderness present.     Left foot: Normal range of motion.  Feet:     Left foot:     Skin integrity: Erythema present.     Comments: Erythema noted to right fifth digit, pinpoint while dot noted, likely bug bite in nature.  Neurological:     Mental Status: She is alert. Mental status is at baseline.      ED Treatments / Results  Labs (all labs ordered are listed, but only abnormal results are displayed) Labs Reviewed - No data to display  EKG None  Radiology Mr Pelvis W Wo Contrast  Result Date: 06/27/2019 CLINICAL DATA:  Newly diagnosed squamous cell carcinoma of  vagina. Previous hysterectomy. Staging. EXAM: MRI PELVIS WITHOUT AND WITH CONTRAST TECHNIQUE: Multiplanar multisequence MR imaging of the pelvis was performed both before and after administration of intravenous contrast. CONTRAST:  19mL MULTIHANCE GADOBENATE DIMEGLUMINE 529 MG/ML IV SOLN COMPARISON:  CT on 06/18/2019 FINDINGS: Lower Urinary Tract: No  urinary bladder or urethral abnormality identified. No evidence of ureteral dilatation. Bowel: Unremarkable appearance of rectum and other pelvic bowel loops. Vascular/Lymphatic: Unremarkable. No pathologically enlarged pelvic lymph nodes identified. Reproductive: -- Uterus: Previous hysterectomy. Susceptibility artifact is seen in the vaginal cuff from suture material, which limits evaluation. However, no focal soft tissue mass is seen involving the vaginal cuff, and no abnormal soft tissue density is seen in the parametrial regions. -- Right ovary: Normal postmenopausal appearance. No mass or inflammatory process identified. -- Left ovary: Normal postmenopausal appearance. No mass or inflammatory process identified. Other: No peritoneal thickening or abnormal free fluid. Musculoskeletal:  Unremarkable. IMPRESSION: Susceptibility artifact from vaginal cuff suture material limits evaluation, however no focal soft tissue mass is seen involving the vaginal cuff or parametrial soft tissues. No evidence of lymphadenopathy or other pelvic metastatic disease. Electronically Signed   By: Marlaine Hind M.D.   On: 06/27/2019 10:51    Procedures Procedures (including critical care time)  Medications Ordered in ED Medications  acetaminophen (TYLENOL) tablet 650 mg (has no administration in time range)     Initial Impression / Assessment and Plan / ED Course  I have reviewed the triage vital signs and the nursing notes.  Pertinent labs & imaging results that were available during my care of the patient were reviewed by me and considered in my medical decision making  (see chart for details).        Patient with an extensive PMH presents to the ED with a chief complaint of right pinky toe pain x 3 days. Reports swelling and pain with ambulation, has not tried any medication to help with her symptoms. Denies any fever, IVDU, or trauma.  During evaluation fifth digit appears erythematous, swollen but is neurovascularly intact. She is afebrile on today's visit. She denies any trauma, doubt osteomyelitis but will obtain xray to further evaluate patients condition. She is requesting drainage of her wound, advised her there is no fluctuance that requires draining at this time. Will place her on antibiotics, keflex to help with her infection.  Personally reviewed patient's x-ray, no fracture, dislocation, signs of osteomyelitis on my evaluation.  Will wait on final disposition via radiologist pending patient's discharge.  Portions of this note were generated with Lobby[employer]. Dictation errors may occur despite best attempts at proofreading.  Final Clinical Impressions(s) / ED Diagnoses   Final diagnoses:  Insect bite (nonvenomous) of anus, initial encounter    ED Discharge Orders         Ordered    cephALEXin (KEFLEX) 500 MG capsule  2 times daily     06/28/19 1423           Janeece Fitting, PA-C 06/28/19 1500    Carmin Muskrat, MD 06/28/19 [phone]

## 2019-06-28 NOTE — ED Triage Notes (Signed)
Patient presents with right 5th digit pain. She believes she was bitten by an insect 3 days ago. She reports a dull, burning sensation that increases with walking.   EMS reports vitals WNL.

## 2019-06-28 NOTE — Discharge Instructions (Addendum)
I have prescribed antibiotics to help treat your insect bite, please take 1 tablet twice a day for the next 7 days.  You may apply warm compress to help with your pain. Alternate aleve or ibuprofen to help with your symptoms.  If you experience fever, worsening symptoms please return to the ED.

## 2019-06-29 ENCOUNTER — Ambulatory Visit: Payer: Medicaid Other | Admitting: Gynecologic Oncology

## 2019-07-09 ENCOUNTER — Ambulatory Visit: Payer: Medicaid Other | Admitting: Gynecologic Oncology

## 2019-07-09 ENCOUNTER — Inpatient Hospital Stay: Payer: Medicaid Other | Attending: Gynecologic Oncology | Admitting: Gynecologic Oncology

## 2019-07-09 ENCOUNTER — Encounter: Payer: Self-pay | Admitting: Gynecologic Oncology

## 2019-07-09 ENCOUNTER — Other Ambulatory Visit: Payer: Self-pay

## 2019-07-09 ENCOUNTER — Other Ambulatory Visit: Payer: Self-pay | Admitting: Nurse Practitioner

## 2019-07-09 VITALS — BP 113/67 | HR 79 | Temp 98.0°F | Resp 16 | Ht 68.0 in | Wt 283.0 lb

## 2019-07-09 DIAGNOSIS — F603 Borderline personality disorder: Secondary | ICD-10-CM | POA: Diagnosis not present

## 2019-07-09 DIAGNOSIS — Z6841 Body Mass Index (BMI) 40.0 and over, adult: Secondary | ICD-10-CM | POA: Insufficient documentation

## 2019-07-09 DIAGNOSIS — Z1231 Encounter for screening mammogram for malignant neoplasm of breast: Secondary | ICD-10-CM

## 2019-07-09 DIAGNOSIS — F319 Bipolar disorder, unspecified: Secondary | ICD-10-CM | POA: Insufficient documentation

## 2019-07-09 DIAGNOSIS — Z90710 Acquired absence of both cervix and uterus: Secondary | ICD-10-CM | POA: Diagnosis not present

## 2019-07-09 DIAGNOSIS — C52 Malignant neoplasm of vagina: Secondary | ICD-10-CM

## 2019-07-09 DIAGNOSIS — G8929 Other chronic pain: Secondary | ICD-10-CM | POA: Diagnosis not present

## 2019-07-09 NOTE — Progress Notes (Signed)
Follow-up Note: Gyn-Onc  Consult was requested by Dr. Rip Harbour for the evaluation of Jordan [name] 53 y.o. female  CC:  Chief Complaint  Patient presents with  . Vaginal Cancer    Assessment/Plan:  Jordan. Jordan [name]  is a 53 y.o.  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 No obvious para-vaginal extension.  I reviewed the images from her MRI and the radiology report.  Given that there is no apparent para-vaginal extension, she may be a candidate for radical upper vaginectomy with SLN biopsy.  She understands with this approach there are risks associated with surgery (particularly healing complications, and damage to the bladder or rectum). There is also a risk of recurrence in the distal vagina. However, with this approach there is a possibility for avoidance of radiation.  Alternatively, we could administer primary radiation. However, with this approach, if it is not curative and she recurs, then she will require a total pelvic exenteration to control a recurrence and local resection will no longer be an option.  With her poorly controlled borderline personality disorder, and morbid obesity, and chronic pain disorder with poorly controlled pain, I have concerns about engaging in surgical treatment for this patient. However, she plans on staying on medical therapy perioperatively to stabilize her mood.   HPI: Jordan [name] is a 53 year old P1 who is seen in consultation at the request of Dr Rip Harbour for squamous cell carcinoma of the vulva.  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  A follow-up Pap smear in 2018 showed H SIL.  She then underwent a vaginal hysterectomy which confirmed residual CIN-3 with negative margins at the hysterectomy specimen.  She underwent a Pap test for surveillance in April 21, 2019 which revealed the AIN 2-3, H SIL.  Subsequently a vaginal colposcopy was performed on  May 28, 2019.  No visible lesions were seen.  There was minimal acetowhite changes noted after acetic acid was applied that was not noted which location.  Biopsies were obtained from the vagina.  Final pathology revealed poorly differentiated squamous cell carcinoma.  The tumor cells were positive for CK 5 6, p63, and P 16.  The patient has a history of bipolar disorder.  She has been treated with medical therapies for this.  She has a history of obesity with a BMI of 41 kg/m.  The patient is a chronic tobacco user.  She has a history of PICC line infection with sepsis due to gram-negative bacteria.  She has had one prior vaginal delivery.  She is currently not employed.   On June 18, 2019 she underwent staging CT scan of the chest abdomen and pelvis.  This revealed that she was status post hysterectomy with no evidence of metastatic disease in the abdomen or pelvis or chest.  There was a 6 mm right lower lobe that was grossly unchanged from a 2017 CT scan felt to be benign.  A compression fracture was seen at L2 unchanged from prior radiographs.  Interval Hx:  An MRI of the pelvis with IV contrast was performed on June 26, 2019.  It revealed susceptibility artifact in the vaginal cuff from suture material limiting evaluation.  However no focal soft tissue masses were seen involving the vaginal cuff and no abnormal soft tissue densities in the parametrial regions.  The right and left ovaries were grossly normal.   Current Meds:  Outpatient Encounter Medications as of  07/09/2019  Medication Sig  . acetaminophen (TYLENOL) 500 MG tablet Take 1,000 mg by mouth every 6 (six) hours as needed for moderate pain.  . Oxycodone HCl 10 MG TABS Take 10 mg by mouth every 6 (six) hours as needed for pain.  . Vitamin D, Ergocalciferol, (DRISDOL) 1.25 MG (50000 UT) CAPS capsule Take 50,000 Units by mouth once a week.  . [DISCONTINUED] nitrofurantoin, macrocrystal-monohydrate, (MACROBID) 100 MG capsule Take  100 mg by mouth at bedtime.   No facility-administered encounter medications on file as of 07/09/2019.     Allergy:  Allergies  Allergen Reactions  . Ciprofloxacin Swelling    Lips swell, tongue swells, face swells  . Citalopram Other (See Comments)    Possible cause of pancytopenia. Swelling of tongue, face and throat  . Lamotrigine Other (See Comments)    Possible cause of pancytopenia. Swelling of face, throat and tongue  . Sulfa Antibiotics Anaphylaxis  . Tramadol Anaphylaxis, Shortness Of Breath and Swelling    Social Hx:   Social History   Socioeconomic History  . Marital status: Soil scientist    Spouse name: Not on file  . Number of children: Not on file  . Years of education: Not on file  . Highest education level: Not on file  Occupational History  . Not on file  Social Needs  . Financial resource strain: Not on file  . Food insecurity    Worry: Not on file    Inability: Not on file  . Transportation needs    Medical: Not on file    Non-medical: Not on file  Tobacco Use  . Smoking status: Never Smoker  . Smokeless tobacco: Never Used  Substance and Sexual Activity  . Alcohol use: No  . Drug use: No  . Sexual activity: Yes    Birth control/protection: Post-menopausal, Surgical    Comment: perimenopausal; no sex in years  Lifestyle  . Physical activity    Days per week: Not on file    Minutes per session: Not on file  . Stress: Not on file  Relationships  . Social Herbalist on phone: Not on file    Gets together: Not on file    Attends religious service: Not on file    Active member of club or organization: Not on file    Attends meetings of clubs or organizations: Not on file    Relationship status: Not on file  . Intimate partner violence    Fear of current or ex partner: Not on file    Emotionally abused: Not on file    Physically abused: Not on file    Forced sexual activity: Not on file  Other Topics Concern  . Not on file   Social History Narrative  . Not on file    Past Surgical Hx:  Past Surgical History:  Procedure Laterality Date  . ABDOMINAL HYSTERECTOMY    . CERVICAL CONIZATION W/BX N/A 07/01/2016   Procedure: CONIZATION CERVIX WITH BIOPSY;  Surgeon: Chancy Milroy, MD;  Location: [facility];  Service: Gynecology;  Laterality: N/A;  . CHOLECYSTECTOMY    . HYSTEROSCOPY W/D&C N/A 01/25/2016   Procedure: DILATATION AND CURETTAGE /HYSTEROSCOPY;  Surgeon: Mora Bellman, MD;  Location: [facility];  Service: Gynecology;  Laterality: N/A;  . VAGINAL HYSTERECTOMY N/A 03/11/2017   Procedure: HYSTERECTOMY VAGINAL WITH MORCELLATION;  Surgeon: Chancy Milroy, MD;  Location: [facility];  Service: Gynecology;  Laterality: N/A;    Past Medical Hx:  Past  Medical History:  Diagnosis Date  . Alcohol abuse   . Anemia    patient denies  . Bipolar 1 disorder (HCC)    No medications currently  . CAP (community acquired pneumonia) 03/17/2015  . 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 Mental disorder   . Obesity   . PICC line infection 05/17/2015  . Sepsis due to Gram negative bacteria (MDR E Coli) 02/18/2015  . 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 UTI (lower urinary tract infection)   . Vaginal Pap smear, abnormal     Past Gynecological History:  See HPI No LMP recorded (lmp unknown). Patient has had a hysterectomy.  Family Hx:  Family History  Problem Relation Age of Onset  . Alcohol abuse Father   . Cancer Father   . Breast cancer Neg Hx     Review of Systems:  Constitutional  Feels well,    ENT Normal appearing ears and nares bilaterally Skin/Breast  No rash, sores, jaundice, itching, dryness Cardiovascular  No chest pain, shortness of breath, or edema  Pulmonary  No cough or wheeze.  Gastro Intestinal  No nausea, vomitting, or diarrhoea. No bright red blood per rectum, no abdominal pain, change in bowel movement, or constipation.  Genito Urinary  No frequency, urgency,  dysuria, no bleeding or discharge. Not sexually active Musculo Skeletal  No myalgia, arthralgia, joint swelling or pain  Neurologic  No weakness, numbness, change in gait,  Psychology  No depression, anxiety, insomnia.   Vitals:  Blood pressure 113/67, pulse 79, temperature 98 F (36.7 C), temperature source Oral, resp. rate 16, height 5\' 8"  (1.727 m), weight 283 lb (128.4 kg), SpO2 100 %.  Physical Exam: WD in NAD Neck  Supple NROM, without any enlargements.  Lymph Node Survey No cervical supraclavicular or inguinal adenopathy Cardiovascular  Pulse normal rate, regularity and rhythm. S1 and S2 normal.  Lungs  Clear to auscultation bilateraly, without wheezes/crackles/rhonchi. Good air movement.  Skin  No rash/lesions/breakdown  Psychiatry  Alert and oriented to person, place, and time  Abdomen  Normoactive bowel sounds, abdomen soft, non-tender and obese without evidence of hernia.  Back No CVA tenderness Genito Urinary  Vulva/vagina: Normal external female genitalia.  No lesions. No discharge or bleeding.  Bladder/urethra:  No lesions or masses, well supported bladder  Vagina: There is no gross exophytic lesions present.  At the left vaginal cuff there is some erythematous change which is magnified with application of acetic acid.  There is no exophytic lesion.  The vaginal mucosa is palpably normal.  There is no palpable infiltration into the paravaginal tissues.  Cervix and uterus surgically absent.  Adnexa: no palpable masses. Rectal  No palpaple nodularity or parametrial/paravaginal induration or infiltration Extremities  No bilateral cyanosis, clubbing or edema.   Thereasa Solo, MD  07/09/2019, 4:51 PM

## 2019-07-09 NOTE — H&P (View-Only) (Signed)
Follow-up Note: Gyn-Onc  Consult was requested by Dr. Rip Harbour for the evaluation of Jordan [name] 53 y.o. female  CC:  Chief Complaint  Patient presents with  . Vaginal Cancer    Assessment/Plan:  Jordan [name]  is a 53 y.o.  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 No obvious para-vaginal extension.  I reviewed the images from her MRI and the radiology report.  Given that there is no apparent para-vaginal extension, she may be a candidate for radical upper vaginectomy with SLN biopsy.  She understands with this approach there are risks associated with surgery (particularly healing complications, and damage to the bladder or rectum). There is also a risk of recurrence in the distal vagina. However, with this approach there is a possibility for avoidance of radiation.  Alternatively, we could administer primary radiation. However, with this approach, if it is not curative and she recurs, then she will require a total pelvic exenteration to control a recurrence and local resection will no longer be an option.  With her poorly controlled borderline personality disorder, and morbid obesity, and chronic pain disorder with poorly controlled pain, I have concerns about engaging in surgical treatment for this patient. However, she plans on staying on medical therapy perioperatively to stabilize her mood.   HPI: Jordan [name] is a 53 year old P1 who is seen in consultation at the request of Dr Rip Harbour for squamous cell carcinoma of the vulva.  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  A follow-up Pap smear in 2018 showed H SIL.  She then underwent a vaginal hysterectomy which confirmed residual CIN-3 with negative margins at the hysterectomy specimen.  She underwent a Pap test for surveillance in April 21, 2019 which revealed the AIN 2-3, H SIL.  Subsequently a vaginal colposcopy was performed on  May 28, 2019.  No visible lesions were seen.  There was minimal acetowhite changes noted after acetic acid was applied that was not noted which location.  Biopsies were obtained from the vagina.  Final pathology revealed poorly differentiated squamous cell carcinoma.  The tumor cells were positive for CK 5 6, p63, and P 16.  The patient has a history of bipolar disorder.  She has been treated with medical therapies for this.  She has a history of obesity with a BMI of 41 kg/m.  The patient is a chronic tobacco user.  She has a history of PICC line infection with sepsis due to gram-negative bacteria.  She has had one prior vaginal delivery.  She is currently not employed.   On June 18, 2019 she underwent staging CT scan of the chest abdomen and pelvis.  This revealed that she was status post hysterectomy with no evidence of metastatic disease in the abdomen or pelvis or chest.  There was a 6 mm right lower lobe that was grossly unchanged from a 2017 CT scan felt to be benign.  A compression fracture was seen at L2 unchanged from prior radiographs.  Interval Hx:  An MRI of the pelvis with IV contrast was performed on June 26, 2019.  It revealed susceptibility artifact in the vaginal cuff from suture material limiting evaluation.  However no focal soft tissue masses were seen involving the vaginal cuff and no abnormal soft tissue densities in the parametrial regions.  The right and left ovaries were grossly normal.   Current Meds:  Outpatient Encounter Medications as of  07/09/2019  Medication Sig  . acetaminophen (TYLENOL) 500 MG tablet Take 1,000 mg by mouth every 6 (six) hours as needed for moderate pain.  . Oxycodone HCl 10 MG TABS Take 10 mg by mouth every 6 (six) hours as needed for pain.  . Vitamin D, Ergocalciferol, (DRISDOL) 1.25 MG (50000 UT) CAPS capsule Take 50,000 Units by mouth once a week.  . [DISCONTINUED] nitrofurantoin, macrocrystal-monohydrate, (MACROBID) 100 MG capsule Take  100 mg by mouth at bedtime.   No facility-administered encounter medications on file as of 07/09/2019.     Allergy:  Allergies  Allergen Reactions  . Ciprofloxacin Swelling    Lips swell, tongue swells, face swells  . Citalopram Other (See Comments)    Possible cause of pancytopenia. Swelling of tongue, face and throat  . Lamotrigine Other (See Comments)    Possible cause of pancytopenia. Swelling of face, throat and tongue  . Sulfa Antibiotics Anaphylaxis  . Tramadol Anaphylaxis, Shortness Of Breath and Swelling    Social Hx:   Social History   Socioeconomic History  . Marital status: Soil scientist    Spouse name: Not on file  . Number of children: Not on file  . Years of education: Not on file  . Highest education level: Not on file  Occupational History  . Not on file  Social Needs  . Financial resource strain: Not on file  . Food insecurity    Worry: Not on file    Inability: Not on file  . Transportation needs    Medical: Not on file    Non-medical: Not on file  Tobacco Use  . Smoking status: Never Smoker  . Smokeless tobacco: Never Used  Substance and Sexual Activity  . Alcohol use: No  . Drug use: No  . Sexual activity: Yes    Birth control/protection: Post-menopausal, Surgical    Comment: perimenopausal; no sex in years  Lifestyle  . Physical activity    Days per week: Not on file    Minutes per session: Not on file  . Stress: Not on file  Relationships  . Social Herbalist on phone: Not on file    Gets together: Not on file    Attends religious service: Not on file    Active member of club or organization: Not on file    Attends meetings of clubs or organizations: Not on file    Relationship status: Not on file  . Intimate partner violence    Fear of current or ex partner: Not on file    Emotionally abused: Not on file    Physically abused: Not on file    Forced sexual activity: Not on file  Other Topics Concern  . Not on file   Social History Narrative  . Not on file    Past Surgical Hx:  Past Surgical History:  Procedure Laterality Date  . ABDOMINAL HYSTERECTOMY    . CERVICAL CONIZATION W/BX N/A 07/01/2016   Procedure: CONIZATION CERVIX WITH BIOPSY;  Surgeon: Chancy Milroy, MD;  Location: [facility];  Service: Gynecology;  Laterality: N/A;  . CHOLECYSTECTOMY    . HYSTEROSCOPY W/D&C N/A 01/25/2016   Procedure: DILATATION AND CURETTAGE /HYSTEROSCOPY;  Surgeon: Mora Bellman, MD;  Location: [facility];  Service: Gynecology;  Laterality: N/A;  . VAGINAL HYSTERECTOMY N/A 03/11/2017   Procedure: HYSTERECTOMY VAGINAL WITH MORCELLATION;  Surgeon: Chancy Milroy, MD;  Location: [facility];  Service: Gynecology;  Laterality: N/A;    Past Medical Hx:  Past  Medical History:  Diagnosis Date  . Alcohol abuse   . Anemia    patient denies  . Bipolar 1 disorder (HCC)    No medications currently  . CAP (community acquired pneumonia) 03/17/2015  . 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 Mental disorder   . Obesity   . PICC line infection 05/17/2015  . Sepsis due to Gram negative bacteria (MDR E Coli) 02/18/2015  . 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[place])   . UTI (lower urinary tract infection)   . Vaginal Pap smear, abnormal     Past Gynecological History:  See HPI No LMP recorded (lmp unknown). Patient has had a hysterectomy.  Family Hx:  Family History  Problem Relation Age of Onset  . Alcohol abuse Father   . Cancer Father   . Breast cancer Neg Hx     Review of Systems:  Constitutional  Feels well,    ENT Normal appearing ears and nares bilaterally Skin/Breast  No rash, sores, jaundice, itching, dryness Cardiovascular  No chest pain, shortness of breath, or edema  Pulmonary  No cough or wheeze.  Gastro Intestinal  No nausea, vomitting, or diarrhoea. No bright red blood per rectum, no abdominal pain, change in bowel movement, or constipation.  Genito Urinary  No frequency, urgency,  dysuria, no bleeding or discharge. Not sexually active Musculo Skeletal  No myalgia, arthralgia, joint swelling or pain  Neurologic  No weakness, numbness, change in gait,  Psychology  No depression, anxiety, insomnia.   Vitals:  Blood pressure 113/67, pulse 79, temperature 98 F (36.7 C), temperature source Oral, resp. rate 16, height 5\' 8"  (1.727 m), weight 283 lb (128.4 kg), SpO2 100 %.  Physical Exam: WD in NAD Neck  Supple NROM, without any enlargements.  Lymph Node Survey No cervical supraclavicular or inguinal adenopathy Cardiovascular  Pulse normal rate, regularity and rhythm. S1 and S2 normal.  Lungs  Clear to auscultation bilateraly, without wheezes/crackles/rhonchi. Good air movement.  Skin  No rash/lesions/breakdown  Psychiatry  Alert and oriented to person, place, and time  Abdomen  Normoactive bowel sounds, abdomen soft, non-tender and obese without evidence of hernia.  Back No CVA tenderness Genito Urinary  Vulva/vagina: Normal external female genitalia.  No lesions. No discharge or bleeding.  Bladder/urethra:  No lesions or masses, well supported bladder  Vagina: There is no gross exophytic lesions present.  At the left vaginal cuff there is some erythematous change which is magnified with application of acetic acid.  There is no exophytic lesion.  The vaginal mucosa is palpably normal.  There is no palpable infiltration into the paravaginal tissues.  Cervix and uterus surgically absent.  Adnexa: no palpable masses. Rectal  No palpaple nodularity or parametrial/paravaginal induration or infiltration Extremities  No bilateral cyanosis, clubbing or edema.   Thereasa Solo, MD  07/09/2019, 4:51 PM

## 2019-07-09 NOTE — Patient Instructions (Signed)
Preparing for your Surgery  Plan for surgery on July 22, 2019 with Dr. Everitt Amber at [facility] will be scheduled for a robotic assisted radical upper vaginectomy, lymph node sampling.   Pre-operative Testing -You will receive a phone call from presurgical testing at [facility] if you have not received a call already to arrange for a pre-operative testing appointment before your surgery.  This appointment normally occurs one to two weeks before your scheduled surgery.   -Bring your insurance card, copy of an advanced directive if applicable, medication list  -At that visit, you will be asked to sign a consent for a possible blood transfusion in case a transfusion becomes necessary during surgery.  The need for a blood transfusion is rare but having consent is a necessary part of your care.     -You should not be taking blood thinners or aspirin at least ten days prior to surgery unless instructed by your surgeon.  -Do not take supplements such as fish oil (omega 3), red yeast rice, tumeric before your surgery.  Day Before Surgery at [facility] will be asked to take in a light diet the day before surgery.  Avoid carbonated beverages.  You will be advised to have nothing to eat or drink after midnight the evening before.    Eat a light diet the day before surgery.  Examples including soups, broths, toast, yogurt, mashed potatoes.  Things to avoid include carbonated beverages (fizzy beverages), raw fruits and raw vegetables, or beans.   If your bowels are filled with gas, your surgeon will have difficulty visualizing your pelvic organs which increases your surgical risks.  Your role in recovery Your role is to become active as soon as directed by your doctor, while still giving yourself time to heal.  Rest when you feel tired. You will be asked to do the following in order to speed your recovery:  - Cough and breathe deeply. This helps toclear and expand your lungs and  can prevent pneumonia. You may be given a spirometer to practice deep breathing. A staff member will show you how to use the spirometer.  - Do mild physical activity. Walking or moving your legs help your circulation and body functions return to normal. A staff member will help you when you try to walk and will provide you with simple exercises. Do not try to get up or walk alone the first time.  - Actively manage your pain. Managing your pain lets you move in comfort. We will ask you to rate your pain on a scale of zero to 10. It is your responsibility to tell your doctor or nurse where and how much you hurt so your pain can be treated.  Special Considerations -If you are diabetic, you may be placed on insulin after surgery to have closer control over your blood sugars to promote healing and recovery.  This does not mean that you will be discharged on insulin.  If applicable, your oral antidiabetics will be resumed when you are tolerating a solid diet.  -Your final pathology results from surgery should be available around one week after surgery and the results will be relayed to you when available.  -Dr. Lahoma Crocker is the surgeon that assists your GYN Oncologist with surgery.  If you end up staying the night, the next day after your surgery you will either see [name] or Dr. Lahoma Crocker.  -FMLA forms can be faxed to [phone] and please allow 5-7  business days for completion.   Blood Transfusion Information WHAT IS A BLOOD TRANSFUSION? A transfusion is the replacement of blood or some of its parts. Blood is made up of multiple cells which provide different functions.  Red blood cells carry oxygen and are used for blood loss replacement.  White blood cells fight against infection.  Platelets control bleeding.  Plasma helps clot blood.  Other blood products are available for specialized needs, such as hemophilia or other clotting disorders. BEFORE THE TRANSFUSION  Who  gives blood for transfusions?   You may be able to donate blood to be used at a later date on yourself (autologous donation).  Relatives can be asked to donate blood. This is generally not any safer than if you have received blood from a stranger. The same precautions are taken to ensure safety when a relative's blood is donated.  Healthy volunteers who are fully evaluated to make sure their blood is safe. This is blood bank blood. Transfusion therapy is the safest it has ever been in the practice of medicine. Before blood is taken from a donor, a complete history is taken to make sure that person has no history of diseases nor engages in risky social behavior (examples are intravenous drug use or sexual activity with multiple partners). The donor's travel history is screened to minimize risk of transmitting infections, such as malaria. The donated blood is tested for signs of infectious diseases, such as HIV and hepatitis. The blood is then tested to be sure it is compatible with you in order to minimize the chance of a transfusion reaction. If you or a relative donates blood, this is often done in anticipation of surgery and is not appropriate for emergency situations. It takes many days to process the donated blood. RISKS AND COMPLICATIONS Although transfusion therapy is very safe and saves many lives, the main dangers of transfusion include:   Getting an infectious disease.  Developing a transfusion reaction. This is an allergic reaction to something in the blood you were given. Every precaution is taken to prevent this. The decision to have a blood transfusion has been considered carefully by your caregiver before blood is given. Blood is not given unless the benefits outweigh the risks.  AFTER SURGERY CONSIDERATIONS  07/09/2019  Return to work: 4-6 weeks if applicable  Activity: 1. Be up and out of the bed during the day.  Take a nap if needed.  You may walk up steps but be careful and  use the hand rail.  Stair climbing will tire you more than you think, you may need to stop part way and rest.   2. No lifting or straining for 6 weeks.  3. No driving for 1 week(s).  Do not drive if you are taking narcotic pain medicine.  4. Shower daily.  Use soap and water on your incision and pat dry; don't rub.  No tub baths until cleared by your surgeon.   5. No sexual activity and nothing in the vagina for 12 weeks.  6. You may experience a small amount of clear drainage from your incisions, which is normal.  If the drainage persists or increases, please call the office.  7. You may experience vaginal spotting after surgery or around the 6-8 week mark from surgery when the stitches at the top of the vagina begin to dissolve.  The spotting is normal but if you experience heavy bleeding, call our office.  8. Take Tylenol or ibuprofen first for pain and only  use stronger pain meds for severe pain not relieved by the Tylenol or Ibuprofen.  Monitor your Tylenol intake to a max of 4,000 mg a day.  Diet: 1. Low sodium Heart Healthy Diet is recommended.  2. It is safe to use a laxative, such as Miralax or Colace, if you have difficulty moving your bowels. You can take Sennakot at bedtime every evening to keep bowel movements regular and to prevent constipation.    Wound Care: 1. Keep clean and dry.  Shower daily.  Reasons to call the Doctor:  Fever - Oral temperature greater than 100.4 degrees Fahrenheit  Foul-smelling vaginal discharge  Difficulty urinating  Nausea and vomiting  Increased pain at the site of the incision that is unrelieved with pain medicine.  Difficulty breathing with or without chest pain  New calf pain especially if only on one side  Sudden, continuing increased vaginal bleeding with or without clots.   Contacts: For questions or concerns you should contact:  Dr. Everitt Amber at [phone]  Joylene John, NP at [phone]  After Hours: call  [phone] and have the GYN Oncologist paged/contacted

## 2019-07-12 ENCOUNTER — Telehealth: Payer: Self-pay | Admitting: *Deleted

## 2019-07-12 ENCOUNTER — Telehealth: Payer: Self-pay | Admitting: Oncology

## 2019-07-12 NOTE — Telephone Encounter (Signed)
Patient called regarding her surgery for 10/1 and when her pre op appt is. Explained that her surgery is 10/1 and her pre op will be next week.

## 2019-07-12 NOTE — Telephone Encounter (Signed)
Called Varney Biles with [facility] and requested the depth of invasion of the tumor on Accession: [id number] per [name].

## 2019-07-16 NOTE — Patient Instructions (Addendum)
DUE TO COVID-19 ONLY ONE VISITOR IS ALLOWED TO COME WITH YOU AND STAY IN THE WAITING ROOM ONLY DURING PRE OP AND PROCEDURE DAY OF SURGERY. THE 1 VISITOR MAY VISIT WITH YOU AFTER SURGERY IN YOUR PRIVATE ROOM DURING VISITING HOURS ONLY!  YOU NEED TO HAVE A COVID 19 TEST ON 07-22-19  @ 8:45 AM, THIS TEST MUST BE DONE BEFORE SURGERY,                Jordan [name]  07/16/2019   Your procedure is scheduled on: 07-22-19    Report to [facility] Main  Entrance    Report to Admitting at 8:45 AM     Call this number if you have problems the morning of surgery [phone]   Eat a light diet the day before surgery.  Examples including soups, broths, toast, yogurt, mashed potatoes.  Things to avoid include carbonated beverages (fizzy beverages), raw fruits and raw vegetables, or beans.   If your bowels are filled with gas, your surgeon will have difficulty visualizing your pelvic organs which increases your surgical risks.    Remember: NO SOLID FOOD AFTER MIDNIGHT THE NIGHT PRIOR TO SURGERY. NOTHING BY MOUTH EXCEPT CLEAR LIQUIDS UNTIL 8:45 AM .   CLEAR LIQUID DIET   Foods Allowed                                                                     Foods Excluded  Coffee and tea, regular and decaf                             liquids that you cannot  Plain Jell-O any favor except red or purple                                           see through such as: Fruit ices (not with fruit pulp)                                     milk, soups, orange juice  Iced Popsicles                                    All solid food                                   Cranberry, grape and apple juices Sports drinks like Gatorade Lightly seasoned clear broth or consume(fat free) Sugar, honey syrup   _____________________________________________________________________       Take these medicines the morning of surgery with A SIP OF WATER: Lurasidone (Latuda), and Oxy IR, (prn)  BRUSH YOUR TEETH MORNING  OF SURGERY AND RINSE YOUR MOUTH OUT, NO CHEWING GUM CANDY OR MINTS  You may not have any metal on your body including hair pins and              piercings     Do not wear jewelry, make-up, lotions, powders or perfumes, deodorant              Do not wear nail polish on your fingernails.  Do not shave  48 hours prior to surgery.     Do not bring valuables to the hospital. [place].  Contacts, dentures or bridgework may not be worn into surgery.  Leave suitcase in the car. After surgery it may be brought to your room.     Special Instructions: N/A              Please read over the following fact sheets you were given: _____________________________________________________________________             [facility] - Preparing for Surgery Before surgery, you can play an important role.  Because skin is not sterile, your skin needs to be as free of germs as possible.  You can reduce the number of germs on your skin by washing with CHG (chlorahexidine gluconate) soap before surgery.  CHG is an antiseptic cleaner which kills germs and bonds with the skin to continue killing germs even after washing. Please DO NOT use if you have an allergy to CHG or antibacterial soaps.  If your skin becomes reddened/irritated stop using the CHG and inform your nurse when you arrive at Short Stay. Do not shave (including legs and underarms) for at least 48 hours prior to the first CHG shower.  You may shave your face/neck. Please follow these instructions carefully:  1.  Shower with CHG Soap the night before surgery and the  morning of Surgery.  2.  If you choose to wash your hair, wash your hair first as usual with your  normal  shampoo.  3.  After you shampoo, rinse your hair and body thoroughly to remove the  shampoo.                           4.  Use CHG as you would any other liquid soap.  You can apply chg directly  to the  skin and wash                       Gently with a scrungie or clean washcloth.  5.  Apply the CHG Soap to your body ONLY FROM THE NECK DOWN.   Do not use on face/ open                           Wound or open sores. Avoid contact with eyes, ears mouth and genitals (private parts).                       Wash face,  Genitals (private parts) with your normal soap.             6.  Wash thoroughly, paying special attention to the area where your surgery  will be performed.  7.  Thoroughly rinse your body with warm water from the neck down.  8.  DO NOT shower/wash with your normal soap after using and rinsing off  the CHG Soap.  9.  Pat yourself dry with a clean towel.            10.  Wear clean pajamas.            11.  Place clean sheets on your bed the night of your first shower and do not  sleep with pets. Day of Surgery : Do not apply any lotions/deodorants the morning of surgery.  Please wear clean clothes to the hospital/surgery center.  FAILURE TO FOLLOW THESE INSTRUCTIONS MAY RESULT IN THE CANCELLATION OF YOUR SURGERY PATIENT SIGNATURE_________________________________  NURSE SIGNATURE__________________________________  ________________________________________________________________________   Adam Phenix  An incentive spirometer is a tool that can help keep your lungs clear and active. This tool measures how well you are filling your lungs with each breath. Taking long deep breaths may help reverse or decrease the chance of developing breathing (pulmonary) problems (especially infection) following:  A long period of time when you are unable to move or be active. BEFORE THE PROCEDURE   If the spirometer includes an indicator to show your best effort, your nurse or respiratory therapist will set it to a desired goal.  If possible, sit up straight or lean slightly forward. Try not to slouch.  Hold the incentive spirometer in an upright position. INSTRUCTIONS FOR  USE  1. Sit on the edge of your bed if possible, or sit up as far as you can in bed or on a chair. 2. Hold the incentive spirometer in an upright position. 3. Breathe out normally. 4. Place the mouthpiece in your mouth and seal your lips tightly around it. 5. Breathe in slowly and as deeply as possible, raising the piston or the ball toward the top of the column. 6. Hold your breath for 3-5 seconds or for as long as possible. Allow the piston or ball to fall to the bottom of the column. 7. Remove the mouthpiece from your mouth and breathe out normally. 8. Rest for a few seconds and repeat Steps 1 through 7 at least 10 times every 1-2 hours when you are awake. Take your time and take a few normal breaths between deep breaths. 9. The spirometer may include an indicator to show your best effort. Use the indicator as a goal to work toward during each repetition. 10. After each set of 10 deep breaths, practice coughing to be sure your lungs are clear. If you have an incision (the cut made at the time of surgery), support your incision when coughing by placing a pillow or rolled up towels firmly against it. Once you are able to get out of bed, walk around indoors and cough well. You may stop using the incentive spirometer when instructed by your caregiver.  RISKS AND COMPLICATIONS  Take your time so you do not get dizzy or light-headed.  If you are in pain, you may need to take or ask for pain medication before doing incentive spirometry. It is harder to take a deep breath if you are having pain. AFTER USE  Rest and breathe slowly and easily.  It can be helpful to keep track of a log of your progress. Your caregiver can provide you with a simple table to help with this. If you are using the spirometer at home, follow these instructions: [place] IF:   You are having difficultly using the spirometer.  You have trouble using the spirometer as often as instructed.  Your pain medication  is not giving enough relief while using the spirometer.  You  develop fever of 100.5 F (38.1 C) or higher. SEEK IMMEDIATE MEDICAL CARE IF:   You cough up bloody sputum that had not been present before.  You develop fever of 102 F (38.9 C) or greater.  You develop worsening pain at or near the incision site. MAKE SURE YOU:   Understand these instructions.  Will watch your condition.  Will get help right away if you are not doing well or get worse. Document Released: 02/17/2007 Document Revised: 12/30/2011 Document Reviewed: 04/20/2007 ExitCare Patient Information 2014 ExitCare, [place].   ________________________________________________________________________  WHAT IS A BLOOD TRANSFUSION? Blood Transfusion Information  A transfusion is the replacement of blood or some of its parts. Blood is made up of multiple cells which provide different functions.  Red blood cells carry oxygen and are used for blood loss replacement.  White blood cells fight against infection.  Platelets control bleeding.  Plasma helps clot blood.  Other blood products are available for specialized needs, such as hemophilia or other clotting disorders. BEFORE THE TRANSFUSION  Who gives blood for transfusions?   Healthy volunteers who are fully evaluated to make sure their blood is safe. This is blood bank blood. Transfusion therapy is the safest it has ever been in the practice of medicine. Before blood is taken from a donor, a complete history is taken to make sure that person has no history of diseases nor engages in risky social behavior (examples are intravenous drug use or sexual activity with multiple partners). The donor's travel history is screened to minimize risk of transmitting infections, such as malaria. The donated blood is tested for signs of infectious diseases, such as HIV and hepatitis. The blood is then tested to be sure it is compatible with you in order to minimize the chance of a  transfusion reaction. If you or a relative donates blood, this is often done in anticipation of surgery and is not appropriate for emergency situations. It takes many days to process the donated blood. RISKS AND COMPLICATIONS Although transfusion therapy is very safe and saves many lives, the main dangers of transfusion include:   Getting an infectious disease.  Developing a transfusion reaction. This is an allergic reaction to something in the blood you were given. Every precaution is taken to prevent this. The decision to have a blood transfusion has been considered carefully by your caregiver before blood is given. Blood is not given unless the benefits outweigh the risks. AFTER THE TRANSFUSION  Right after receiving a blood transfusion, you will usually feel much better and more energetic. This is especially true if your red blood cells have gotten low (anemic). The transfusion raises the level of the red blood cells which carry oxygen, and this usually causes an energy increase.  The nurse administering the transfusion will monitor you carefully for complications. HOME CARE INSTRUCTIONS  No special instructions are needed after a transfusion. You may find your energy is better. Speak with your caregiver about any limitations on activity for underlying diseases you may have. SEEK MEDICAL CARE IF:   Your condition is not improving after your transfusion.  You develop redness or irritation at the intravenous (IV) site. SEEK IMMEDIATE MEDICAL CARE IF:  Any of the following symptoms occur over the next 12 hours:  Shaking chills.  You have a temperature by mouth above 102 F (38.9 C), not controlled by medicine.  Chest, back, or muscle pain.  People around you feel you are not acting correctly or are confused.  Shortness of breath  or difficulty breathing.  Dizziness and fainting.  You get a rash or develop hives.  You have a decrease in urine output.  Your urine turns a dark  color or changes to pink, red, or brown. Any of the following symptoms occur over the next 10 days:  You have a temperature by mouth above 102 F (38.9 C), not controlled by medicine.  Shortness of breath.  Weakness after normal activity.  The white part of the eye turns yellow (jaundice).  You have a decrease in the amount of urine or are urinating less often.  Your urine turns a dark color or changes to pink, red, or brown. Document Released: 10/04/2000 Document Revised: 12/30/2011 Document Reviewed: 05/23/2008 [facility] Patient Information 2014 [place], [place].  _______________________________________________________________________

## 2019-07-19 ENCOUNTER — Other Ambulatory Visit: Payer: Self-pay | Admitting: Oncology

## 2019-07-19 ENCOUNTER — Encounter (HOSPITAL_COMMUNITY): Payer: Self-pay

## 2019-07-19 ENCOUNTER — Other Ambulatory Visit: Payer: Self-pay

## 2019-07-19 ENCOUNTER — Encounter (HOSPITAL_COMMUNITY)
Admission: RE | Admit: 2019-07-19 | Discharge: 2019-07-19 | Disposition: A | Discharge status: Home / Self Care | Payer: Medicaid Other | Source: Ambulatory Visit | Attending: Gynecologic Oncology | Admitting: Gynecologic Oncology

## 2019-07-19 DIAGNOSIS — C52 Malignant neoplasm of vagina: Secondary | ICD-10-CM | POA: Diagnosis not present

## 2019-07-19 DIAGNOSIS — Z01812 Encounter for preprocedural laboratory examination: Secondary | ICD-10-CM | POA: Diagnosis present

## 2019-07-19 LAB — CBC
HCT: 45.2 % (ref 36.0–46.0)
Hemoglobin: 15.1 g/dL — ABNORMAL HIGH (ref 12.0–15.0)
MCH: 31.3 pg (ref 26.0–34.0)
MCHC: 33.4 g/dL (ref 30.0–36.0)
MCV: 93.8 fL (ref 80.0–100.0)
Platelets: 147 10*3/uL — ABNORMAL LOW (ref 150–400)
RBC: 4.82 MIL/uL (ref 3.87–5.11)
RDW: 12.6 % (ref 11.5–15.5)
WBC: 3.6 10*3/uL — ABNORMAL LOW (ref 4.0–10.5)
nRBC: 0 % (ref 0.0–0.2)

## 2019-07-19 LAB — COMPREHENSIVE METABOLIC PANEL
ALT: 14 U/L (ref 0–44)
AST: 20 U/L (ref 15–41)
Albumin: 4.4 g/dL (ref 3.5–5.0)
Alkaline Phosphatase: 107 U/L (ref 38–126)
Anion gap: 9 (ref 5–15)
BUN: 11 mg/dL (ref 6–20)
CO2: 26 mmol/L (ref 22–32)
Calcium: 9.2 mg/dL (ref 8.9–10.3)
Chloride: 103 mmol/L (ref 98–111)
Creatinine, Ser: 0.68 mg/dL (ref 0.44–1.00)
GFR calc Af Amer: >60 mL/min (ref >60)
GFR calc non Af Amer: >60 mL/min (ref >60)
Glucose, Bld: 90 mg/dL (ref 70–99)
Potassium: 4.7 mmol/L (ref 3.5–5.1)
Sodium: 138 mmol/L (ref 135–145)
Total Bilirubin: 1.2 mg/dL (ref 0.3–1.2)
Total Protein: 7.2 g/dL (ref 6.5–8.1)

## 2019-07-19 LAB — URINALYSIS, ROUTINE W REFLEX MICROSCOPIC
Bilirubin Urine: NEGATIVE
Glucose, UA: NEGATIVE mg/dL
Ketones, ur: NEGATIVE mg/dL
Nitrite: POSITIVE — AB
Protein, ur: NEGATIVE mg/dL
Specific Gravity, Urine: 1.02 (ref 1.005–1.030)
pH: 5 (ref 5.0–8.0)

## 2019-07-19 LAB — ABO/RH: ABO/RH(D): A NEG

## 2019-07-19 NOTE — Progress Notes (Signed)
Gynecologic Oncology Multi-Disciplinary Disposition Conference Note  Date of the Conference: 07/19/2019  Patient Name: Jordan [name]  Referring Provider: Dr. Rip Harbour Primary GYN Oncologist: [name]  Stage/Disposition:  Stage I vaginal squamous cell carcinoma. Disposition is to surgery with radical upper vaginectomy.  Radiation post op if no clear margins.   This Multidisciplinary conference took place involving physicians from [facility], [place], Radiation Oncology, Pathology, Radiology along with the Gynecologic Oncology Nurse Practitioner and RN.  Comprehensive assessment of the patient's malignancy, staging, need for surgery, chemotherapy, radiation therapy, and need for further testing were reviewed. Supportive measures, both inpatient and following discharge were also discussed. The recommended plan of care is documented. Greater than 35 minutes were spent correlating and coordinating this patient's care.

## 2019-07-19 NOTE — Progress Notes (Signed)
Due to transportation issue (Per patient, [facility] refuses to transport to COVID-19 site), pt needs to be tested the day of surgery for COVID-19. Spoke to Santiago Glad in [employer], Okay for patient to be tested the day of surgery for COVID-19. Pt to report to [facility] 3 hours prior to surgery for testing and pre-surgical prep. Therefore,  patient to report to [facility] on 07-22-19 at 8:45 AM..Marland KitchenPatient verbalized understanding.

## 2019-07-19 NOTE — Progress Notes (Signed)
PCP - [name], NP Cardiologist -   Chest x-ray - 04-26-19  EKG - 04-26-19   Stress Test -  ECHO -  Cardiac Cath -   Sleep Study -  CPAP -   Fasting Blood Sugar -  Checks Blood Sugar _____ times a day  Blood Thinner Instructions: Aspirin Instructions: Last Dose:  Anesthesia review:   Patient denies shortness of breath, fever, cough and chest pain at PAT appointment   Patient verbalized understanding of instructions that were given to them at the PAT appointment. Patient was also instructed that they will need to review over the PAT instructions again at home before surgery.

## 2019-07-19 NOTE — Progress Notes (Signed)
UA results routed to Dr. Rossi for review 

## 2019-07-20 ENCOUNTER — Telehealth: Payer: Self-pay

## 2019-07-20 ENCOUNTER — Other Ambulatory Visit: Payer: Self-pay | Admitting: Gynecologic Oncology

## 2019-07-20 DIAGNOSIS — N3000 Acute cystitis without hematuria: Secondary | ICD-10-CM

## 2019-07-20 MED ORDER — NITROFURANTOIN MONOHYD MACRO 100 MG PO CAPS: 100.0000 mg | ORAL_CAPSULE | Freq: Two times a day (BID) | ORAL | 0 refills | Status: DC

## 2019-07-20 NOTE — Progress Notes (Signed)
Macrobid prescribed for suspected UTI. Culture pending.  Per Dr. Raeanne Gathers ahead and start macrobid.

## 2019-07-20 NOTE — Telephone Encounter (Signed)
I spoke with Jordan [name] today.  I let her know she has a UTI and that a prescription for macrobid was called to [facility] on [address].  I told her her to take one tablet twice a day.  She verbalized understanding.

## 2019-07-21 ENCOUNTER — Telehealth: Payer: Self-pay

## 2019-07-21 ENCOUNTER — Ambulatory Visit: Payer: Medicaid Other | Admitting: Obstetrics and Gynecology

## 2019-07-21 MED ORDER — DEXTROSE 5 % IV SOLN
3.0000 g | INTRAVENOUS | Status: AC
  Administered 2019-07-22: 3 g via INTRAVENOUS
  Filled 2019-07-21: qty 3

## 2019-07-21 NOTE — Telephone Encounter (Signed)
Jordan [name] stated that she is taking the nitrofurantoin as directed for her UTI. She stated that she did not have any questions regarding her pre op instructions.

## 2019-07-22 ENCOUNTER — Other Ambulatory Visit: Payer: Self-pay

## 2019-07-22 ENCOUNTER — Encounter (HOSPITAL_COMMUNITY): Payer: Self-pay | Admitting: *Deleted

## 2019-07-22 ENCOUNTER — Ambulatory Visit (HOSPITAL_COMMUNITY): Payer: Medicaid Other

## 2019-07-22 ENCOUNTER — Inpatient Hospital Stay (HOSPITAL_COMMUNITY)
Admission: RE | Admit: 2019-07-22 | Discharge: 2019-07-23 | DRG: 740 | Disposition: A | Discharge status: Home / Self Care | Payer: Medicaid Other | Attending: Gynecologic Oncology | Admitting: Gynecologic Oncology

## 2019-07-22 ENCOUNTER — Encounter (HOSPITAL_COMMUNITY)
Admission: RE | Disposition: A | Discharge status: Home / Self Care | Payer: Self-pay | Source: Home / Self Care | Attending: Gynecologic Oncology

## 2019-07-22 ENCOUNTER — Ambulatory Visit (HOSPITAL_COMMUNITY): Payer: Medicaid Other | Admitting: Physician Assistant

## 2019-07-22 DIAGNOSIS — Z90710 Acquired absence of both cervix and uterus: Secondary | ICD-10-CM

## 2019-07-22 DIAGNOSIS — F603 Borderline personality disorder: Secondary | ICD-10-CM | POA: Diagnosis present

## 2019-07-22 DIAGNOSIS — Z86001 Personal history of in-situ neoplasm of cervix uteri: Secondary | ICD-10-CM

## 2019-07-22 DIAGNOSIS — C519 Malignant neoplasm of vulva, unspecified: Secondary | ICD-10-CM | POA: Diagnosis present

## 2019-07-22 DIAGNOSIS — F319 Bipolar disorder, unspecified: Secondary | ICD-10-CM | POA: Diagnosis present

## 2019-07-22 DIAGNOSIS — Z8619 Personal history of other infectious and parasitic diseases: Secondary | ICD-10-CM

## 2019-07-22 DIAGNOSIS — F419 Anxiety disorder, unspecified: Secondary | ICD-10-CM | POA: Diagnosis present

## 2019-07-22 DIAGNOSIS — Z6841 Body Mass Index (BMI) 40.0 and over, adult: Secondary | ICD-10-CM

## 2019-07-22 DIAGNOSIS — Z881 Allergy status to other antibiotic agents status: Secondary | ICD-10-CM

## 2019-07-22 DIAGNOSIS — Z888 Allergy status to other drugs, medicaments and biological substances status: Secondary | ICD-10-CM

## 2019-07-22 DIAGNOSIS — C52 Malignant neoplasm of vagina: Secondary | ICD-10-CM | POA: Diagnosis present

## 2019-07-22 DIAGNOSIS — K660 Peritoneal adhesions (postprocedural) (postinfection): Secondary | ICD-10-CM | POA: Diagnosis present

## 2019-07-22 DIAGNOSIS — Z882 Allergy status to sulfonamides status: Secondary | ICD-10-CM

## 2019-07-22 DIAGNOSIS — F17200 Nicotine dependence, unspecified, uncomplicated: Secondary | ICD-10-CM | POA: Diagnosis present

## 2019-07-22 DIAGNOSIS — Z20828 Contact with and (suspected) exposure to other viral communicable diseases: Secondary | ICD-10-CM | POA: Diagnosis present

## 2019-07-22 DIAGNOSIS — G8929 Other chronic pain: Secondary | ICD-10-CM | POA: Diagnosis present

## 2019-07-22 DIAGNOSIS — N390 Urinary tract infection, site not specified: Secondary | ICD-10-CM | POA: Diagnosis present

## 2019-07-22 HISTORY — PX: ROBOT ASSISTED MYOMECTOMY: SHX5142

## 2019-07-22 HISTORY — PX: LYMPH NODE BIOPSY: SHX201

## 2019-07-22 LAB — SARS CORONAVIRUS 2 BY RT PCR (HOSPITAL ORDER, PERFORMED IN ~~LOC~~ HOSPITAL LAB): SARS Coronavirus 2: NEGATIVE

## 2019-07-22 LAB — TYPE AND SCREEN
ABO/RH(D): A NEG
Antibody Screen: NEGATIVE

## 2019-07-22 SURGERY — MYOMECTOMY, ROBOT-ASSISTED
Anesthesia: General

## 2019-07-22 MED ORDER — KETOROLAC TROMETHAMINE 30 MG/ML IJ SOLN
15.0000 mg | Freq: Once | INTRAMUSCULAR | Status: AC
  Administered 2019-07-22: 15 mg via INTRAVENOUS

## 2019-07-22 MED ORDER — DEXAMETHASONE SODIUM PHOSPHATE 4 MG/ML IJ SOLN: 4.0000 mg | INTRAMUSCULAR | Status: DC

## 2019-07-22 MED ORDER — ONDANSETRON HCL 4 MG/2ML IJ SOLN: 4.0000 mg | Freq: Four times a day (QID) | INTRAMUSCULAR | Status: DC | PRN

## 2019-07-22 MED ORDER — ACETIC ACID 5 % SOLN
1.0000 "application " | Freq: Once | Status: AC
  Administered 2019-07-22: 1 via TOPICAL
  Filled 2019-07-22: qty 500

## 2019-07-22 MED ORDER — ACETAMINOPHEN 500 MG PO TABS
1000.0000 mg | ORAL_TABLET | Freq: Two times a day (BID) | ORAL | Status: DC
  Administered 2019-07-22 – 2019-07-23 (×2): 1000 mg via ORAL
  Filled 2019-07-22 (×2): qty 2

## 2019-07-22 MED ORDER — OXYCODONE HCL 5 MG PO TABS: 5.0000 mg | ORAL_TABLET | Freq: Once | ORAL | Status: DC | PRN

## 2019-07-22 MED ORDER — ONDANSETRON HCL 4 MG/2ML IJ SOLN
INTRAMUSCULAR | Status: AC
  Filled 2019-07-22: qty 2

## 2019-07-22 MED ORDER — NITROFURANTOIN MONOHYD MACRO 100 MG PO CAPS
100.0000 mg | ORAL_CAPSULE | Freq: Two times a day (BID) | ORAL | Status: DC
  Administered 2019-07-22: 100 mg via ORAL
  Filled 2019-07-22 (×3): qty 1

## 2019-07-22 MED ORDER — OXYCODONE HCL 5 MG/5ML PO SOLN: 5.0000 mg | Freq: Once | ORAL | Status: DC | PRN

## 2019-07-22 MED ORDER — HYDROMORPHONE HCL 1 MG/ML IJ SOLN
INTRAMUSCULAR | Status: AC
  Administered 2019-07-22: 17:00:00 0.5 mg via INTRAVENOUS
  Filled 2019-07-22: qty 1

## 2019-07-22 MED ORDER — FENTANYL CITRATE (PF) 250 MCG/5ML IJ SOLN
INTRAMUSCULAR | Status: AC
  Filled 2019-07-22: qty 5

## 2019-07-22 MED ORDER — LIDOCAINE HCL (CARDIAC) PF 100 MG/5ML IV SOSY
PREFILLED_SYRINGE | INTRAVENOUS | Status: DC | PRN
  Administered 2019-07-22: 100 mg via INTRAVENOUS

## 2019-07-22 MED ORDER — LACTATED RINGERS IV SOLN
INTRAVENOUS | Status: DC
  Administered 2019-07-22 (×2): via INTRAVENOUS

## 2019-07-22 MED ORDER — SCOPOLAMINE 1 MG/3DAYS TD PT72
1.0000 | MEDICATED_PATCH | TRANSDERMAL | Status: DC
  Administered 2019-07-22: 11:00:00 1.5 mg via TRANSDERMAL
  Filled 2019-07-22: qty 1

## 2019-07-22 MED ORDER — PHENYLEPHRINE HCL (PRESSORS) 10 MG/ML IV SOLN
INTRAVENOUS | Status: DC | PRN
  Administered 2019-07-22: 80 ug via INTRAVENOUS

## 2019-07-22 MED ORDER — PROPOFOL 10 MG/ML IV BOLUS
INTRAVENOUS | Status: DC | PRN
  Administered 2019-07-22: 200 mg via INTRAVENOUS

## 2019-07-22 MED ORDER — FENTANYL CITRATE (PF) 250 MCG/5ML IJ SOLN
INTRAMUSCULAR | Status: DC | PRN
  Administered 2019-07-22: 50 ug via INTRAVENOUS
  Administered 2019-07-22: 100 ug via INTRAVENOUS
  Administered 2019-07-22 (×2): 50 ug via INTRAVENOUS

## 2019-07-22 MED ORDER — DEXAMETHASONE SODIUM PHOSPHATE 10 MG/ML IJ SOLN
INTRAMUSCULAR | Status: DC | PRN
  Administered 2019-07-22: 4 mg via INTRAVENOUS

## 2019-07-22 MED ORDER — BUPIVACAINE HCL (PF) 0.25 % IJ SOLN
INTRAMUSCULAR | Status: AC
  Filled 2019-07-22: qty 30

## 2019-07-22 MED ORDER — INDOCYANINE GREEN 25 MG IV SOLR
INTRAVENOUS | Status: DC | PRN
  Administered 2019-07-22: 2.5 mg

## 2019-07-22 MED ORDER — SUCCINYLCHOLINE CHLORIDE 200 MG/10ML IV SOSY
PREFILLED_SYRINGE | INTRAVENOUS | Status: AC
  Filled 2019-07-22: qty 10

## 2019-07-22 MED ORDER — OXYCODONE HCL 5 MG PO TABS
5.0000 mg | ORAL_TABLET | ORAL | Status: DC | PRN
  Administered 2019-07-22 – 2019-07-23 (×4): 5 mg via ORAL
  Filled 2019-07-22 (×4): qty 1

## 2019-07-22 MED ORDER — PROPOFOL 10 MG/ML IV BOLUS
INTRAVENOUS | Status: AC
  Filled 2019-07-22: qty 20

## 2019-07-22 MED ORDER — ROCURONIUM BROMIDE 10 MG/ML (PF) SYRINGE
PREFILLED_SYRINGE | INTRAVENOUS | Status: AC
  Filled 2019-07-22: qty 10

## 2019-07-22 MED ORDER — ONDANSETRON HCL 4 MG PO TABS: 4.0000 mg | ORAL_TABLET | Freq: Four times a day (QID) | ORAL | Status: DC | PRN

## 2019-07-22 MED ORDER — LIDOCAINE 20MG/ML (2%) 15 ML SYRINGE OPTIME
INTRAMUSCULAR | Status: DC | PRN
  Administered 2019-07-22: 1.5 mg/kg/h via INTRAVENOUS

## 2019-07-22 MED ORDER — MIDAZOLAM HCL 5 MG/5ML IJ SOLN
INTRAMUSCULAR | Status: DC | PRN
  Administered 2019-07-22: 2 mg via INTRAVENOUS

## 2019-07-22 MED ORDER — HYDROMORPHONE HCL 1 MG/ML IJ SOLN
INTRAMUSCULAR | Status: AC
  Administered 2019-07-22: 16:00:00 0.5 mg via INTRAVENOUS
  Filled 2019-07-22: qty 1

## 2019-07-22 MED ORDER — SCOPOLAMINE 1 MG/3DAYS TD PT72: 1.0000 | MEDICATED_PATCH | Freq: Once | TRANSDERMAL | Status: DC

## 2019-07-22 MED ORDER — ENOXAPARIN SODIUM 40 MG/0.4ML ~~LOC~~ SOLN
40.0000 mg | SUBCUTANEOUS | Status: AC
  Administered 2019-07-22: 11:00:00 40 mg via SUBCUTANEOUS
  Filled 2019-07-22: qty 0.4

## 2019-07-22 MED ORDER — SUGAMMADEX SODIUM 500 MG/5ML IV SOLN
INTRAVENOUS | Status: AC
  Filled 2019-07-22: qty 5

## 2019-07-22 MED ORDER — ROCURONIUM BROMIDE 100 MG/10ML IV SOLN
INTRAVENOUS | Status: DC | PRN
  Administered 2019-07-22: 20 mg via INTRAVENOUS
  Administered 2019-07-22 (×2): 10 mg via INTRAVENOUS
  Administered 2019-07-22 (×2): 20 mg via INTRAVENOUS
  Administered 2019-07-22: 60 mg via INTRAVENOUS

## 2019-07-22 MED ORDER — MIDAZOLAM HCL 2 MG/2ML IJ SOLN
INTRAMUSCULAR | Status: AC
  Filled 2019-07-22: qty 2

## 2019-07-22 MED ORDER — HYDROMORPHONE HCL 1 MG/ML IJ SOLN
0.5000 mg | INTRAMUSCULAR | Status: DC | PRN
  Administered 2019-07-22 – 2019-07-23 (×3): 0.5 mg via INTRAVENOUS
  Filled 2019-07-22 (×4): qty 0.5

## 2019-07-22 MED ORDER — ACETAMINOPHEN 500 MG PO TABS
1000.0000 mg | ORAL_TABLET | ORAL | Status: AC
  Administered 2019-07-22: 1000 mg via ORAL
  Filled 2019-07-22: qty 2

## 2019-07-22 MED ORDER — DEXAMETHASONE SODIUM PHOSPHATE 10 MG/ML IJ SOLN
INTRAMUSCULAR | Status: AC
  Filled 2019-07-22: qty 1

## 2019-07-22 MED ORDER — ACETAMINOPHEN 500 MG PO TABS: 1000.0000 mg | ORAL_TABLET | Freq: Once | ORAL | Status: DC

## 2019-07-22 MED ORDER — PROMETHAZINE HCL 25 MG/ML IJ SOLN: 6.2500 mg | INTRAMUSCULAR | Status: DC | PRN

## 2019-07-22 MED ORDER — LACTATED RINGERS IR SOLN
Status: DC | PRN
  Administered 2019-07-22: 1000 mL

## 2019-07-22 MED ORDER — SENNOSIDES-DOCUSATE SODIUM 8.6-50 MG PO TABS
2.0000 | ORAL_TABLET | Freq: Every day | ORAL | Status: DC
  Administered 2019-07-22: 21:00:00 2 via ORAL
  Filled 2019-07-22: qty 2

## 2019-07-22 MED ORDER — STERILE WATER FOR INJECTION IJ SOLN
INTRAMUSCULAR | Status: DC | PRN
  Administered 2019-07-22: 4 mL

## 2019-07-22 MED ORDER — GABAPENTIN 600 MG PO TABS
300.0000 mg | ORAL_TABLET | Freq: Three times a day (TID) | ORAL | Status: DC
  Filled 2019-07-22: qty 0.5

## 2019-07-22 MED ORDER — LURASIDONE HCL 20 MG PO TABS
20.0000 mg | ORAL_TABLET | Freq: Every day | ORAL | Status: DC
  Administered 2019-07-23: 20 mg via ORAL
  Filled 2019-07-22: qty 1

## 2019-07-22 MED ORDER — DICLOFENAC SODIUM 50 MG PO TBEC
50.0000 mg | DELAYED_RELEASE_TABLET | Freq: Four times a day (QID) | ORAL | Status: DC
  Administered 2019-07-22 – 2019-07-23 (×2): 50 mg via ORAL
  Filled 2019-07-22 (×6): qty 1

## 2019-07-22 MED ORDER — LIDOCAINE 2% (20 MG/ML) 5 ML SYRINGE
INTRAMUSCULAR | Status: AC
  Filled 2019-07-22: qty 5

## 2019-07-22 MED ORDER — KETOROLAC TROMETHAMINE 15 MG/ML IJ SOLN
INTRAMUSCULAR | Status: AC
  Filled 2019-07-22: qty 1

## 2019-07-22 MED ORDER — HYDROMORPHONE HCL 1 MG/ML IJ SOLN
0.2500 mg | INTRAMUSCULAR | Status: DC | PRN
  Administered 2019-07-22 (×4): 0.5 mg via INTRAVENOUS

## 2019-07-22 MED ORDER — KCL IN DEXTROSE-NACL 20-5-0.45 MEQ/L-%-% IV SOLN
INTRAVENOUS | Status: DC
  Administered 2019-07-22 – 2019-07-23 (×2): via INTRAVENOUS
  Filled 2019-07-22 (×2): qty 1000

## 2019-07-22 MED ORDER — PHENYLEPHRINE 40 MCG/ML (10ML) SYRINGE FOR IV PUSH (FOR BLOOD PRESSURE SUPPORT)
PREFILLED_SYRINGE | INTRAVENOUS | Status: AC
  Filled 2019-07-22: qty 10

## 2019-07-22 MED ORDER — BUPIVACAINE HCL 0.25 % IJ SOLN
INTRAMUSCULAR | Status: DC | PRN
  Administered 2019-07-22: 15 mL

## 2019-07-22 MED ORDER — LIDOCAINE HCL 2 % IJ SOLN
INTRAMUSCULAR | Status: AC
  Filled 2019-07-22: qty 20

## 2019-07-22 MED ORDER — ENOXAPARIN SODIUM 40 MG/0.4ML ~~LOC~~ SOLN
40.0000 mg | SUBCUTANEOUS | Status: DC
  Administered 2019-07-23: 40 mg via SUBCUTANEOUS
  Filled 2019-07-22: qty 0.4

## 2019-07-22 MED ORDER — SUGAMMADEX SODIUM 500 MG/5ML IV SOLN
INTRAVENOUS | Status: DC | PRN
  Administered 2019-07-22: 300 mg via INTRAVENOUS

## 2019-07-22 MED ORDER — GABAPENTIN 300 MG PO CAPS
300.0000 mg | ORAL_CAPSULE | Freq: Three times a day (TID) | ORAL | Status: DC
  Administered 2019-07-22 – 2019-07-23 (×2): 300 mg via ORAL
  Filled 2019-07-22 (×2): qty 1

## 2019-07-22 SURGICAL SUPPLY — 65 items
APPLICATOR SURGIFLO ENDO (HEMOSTASIS) IMPLANT
BAG LAPAROSCOPIC 12 15 PORT 16 (BASKET) IMPLANT
BAG RETRIEVAL 12/15 (BASKET)
BLADE SURG SZ10 CARB STEEL (BLADE) IMPLANT
CONT SPEC 4OZ CLIKSEAL STRL BL (MISCELLANEOUS) ×9 IMPLANT
COVER BACK TABLE 60X90IN (DRAPES) ×3 IMPLANT
COVER SURGICAL LIGHT HANDLE (MISCELLANEOUS) ×3 IMPLANT
COVER TIP SHEARS 8 DVNC (MISCELLANEOUS) ×2 IMPLANT
COVER TIP SHEARS 8MM DA VINCI (MISCELLANEOUS) ×1
COVER WAND RF STERILE (DRAPES) IMPLANT
DECANTER SPIKE VIAL GLASS SM (MISCELLANEOUS) ×6 IMPLANT
DERMABOND ADVANCED (GAUZE/BANDAGES/DRESSINGS) ×1
DERMABOND ADVANCED .7 DNX12 (GAUZE/BANDAGES/DRESSINGS) ×2 IMPLANT
DRAPE ARM DVNC X/XI (DISPOSABLE) ×8 IMPLANT
DRAPE COLUMN DVNC XI (DISPOSABLE) ×2 IMPLANT
DRAPE DA VINCI XI ARM (DISPOSABLE) ×4
DRAPE DA VINCI XI COLUMN (DISPOSABLE) ×1
DRAPE SHEET LG 3/4 BI-LAMINATE (DRAPES) ×3 IMPLANT
DRAPE SURG IRRIG POUCH 19X23 (DRAPES) ×3 IMPLANT
DRSG OPSITE POSTOP 4X6 (GAUZE/BANDAGES/DRESSINGS) IMPLANT
DRSG OPSITE POSTOP 4X8 (GAUZE/BANDAGES/DRESSINGS) IMPLANT
ELECT REM PT RETURN 15FT ADLT (MISCELLANEOUS) ×3 IMPLANT
GLOVE BIO SURGEON STRL SZ 6 (GLOVE) ×12 IMPLANT
GLOVE BIO SURGEON STRL SZ 6.5 (GLOVE) ×9 IMPLANT
GOWN STRL REUS W/ TWL LRG LVL3 (GOWN DISPOSABLE) ×10 IMPLANT
GOWN STRL REUS W/TWL LRG LVL3 (GOWN DISPOSABLE) ×5
HOLDER FOLEY CATH W/STRAP (MISCELLANEOUS) ×3 IMPLANT
IRRIG SUCT STRYKERFLOW 2 WTIP (MISCELLANEOUS) ×3
IRRIGATION SUCT STRKRFLW 2 WTP (MISCELLANEOUS) ×2 IMPLANT
KIT PROCEDURE DA VINCI SI (MISCELLANEOUS) ×1
KIT PROCEDURE DVNC SI (MISCELLANEOUS) ×2 IMPLANT
KIT TURNOVER KIT A (KITS) ×3 IMPLANT
MANIPULATOR UTERINE 4.5 ZUMI (MISCELLANEOUS) IMPLANT
NEEDLE HYPO 22GX1.5 SAFETY (NEEDLE) ×3 IMPLANT
NEEDLE SPNL 18GX3.5 QUINCKE PK (NEEDLE) ×3 IMPLANT
OBTURATOR OPTICAL STANDARD 8MM (TROCAR) ×1
OBTURATOR OPTICAL STND 8 DVNC (TROCAR) ×2
OBTURATOR OPTICALSTD 8 DVNC (TROCAR) ×2 IMPLANT
PACK ROBOT GYN CUSTOM WL (TRAY / TRAY PROCEDURE) ×3 IMPLANT
PAD POSITIONING PINK XL (MISCELLANEOUS) ×3 IMPLANT
PORT ACCESS TROCAR AIRSEAL 12 (TROCAR) ×2 IMPLANT
PORT ACCESS TROCAR AIRSEAL 5M (TROCAR) ×1
POUCH SPECIMEN RETRIEVAL 10MM (ENDOMECHANICALS) IMPLANT
SEAL CANN UNIV 5-8 DVNC XI (MISCELLANEOUS) ×6 IMPLANT
SEAL XI 5MM-8MM UNIVERSAL (MISCELLANEOUS) ×3
SET TRI-LUMEN FLTR TB AIRSEAL (TUBING) ×3 IMPLANT
SPONGE LAP 18X18 X RAY DECT (DISPOSABLE) IMPLANT
SURGIFLO W/THROMBIN 8M KIT (HEMOSTASIS) IMPLANT
SUT MNCRL AB 4-0 PS2 18 (SUTURE) IMPLANT
SUT PDS AB 1 TP1 96 (SUTURE) IMPLANT
SUT VIC AB 0 CT1 27 (SUTURE) ×1
SUT VIC AB 0 CT1 27XBRD ANTBC (SUTURE) ×2 IMPLANT
SUT VIC AB 2-0 CT1 27 (SUTURE)
SUT VIC AB 2-0 CT1 TAPERPNT 27 (SUTURE) IMPLANT
SUT VIC AB 3-0 SH 27 (SUTURE) ×2
SUT VIC AB 3-0 SH 27XBRD (SUTURE) ×4 IMPLANT
SUT VIC AB 4-0 PS2 18 (SUTURE) ×6 IMPLANT
SUT VICRYL 4-0 PS2 18IN ABS (SUTURE) ×3 IMPLANT
SYR 10ML LL (SYRINGE) ×3 IMPLANT
TOWEL OR NON WOVEN STRL DISP B (DISPOSABLE) ×3 IMPLANT
TRAP SPECIMEN MUCOUS 40CC (MISCELLANEOUS) IMPLANT
TRAY FOLEY MTR SLVR 16FR STAT (SET/KITS/TRAYS/PACK) ×3 IMPLANT
UNDERPAD 30X36 HEAVY ABSORB (UNDERPADS AND DIAPERS) ×6 IMPLANT
WATER STERILE IRR 1000ML POUR (IV SOLUTION) ×3 IMPLANT
YANKAUER SUCT BULB TIP 10FT TU (MISCELLANEOUS) IMPLANT

## 2019-07-22 NOTE — Interval H&P Note (Signed)
History and Physical Interval Note:  07/22/2019 10:53 AM  Jordan [name]  has presented today for surgery, with the diagnosis of vaginal cancer.  The various methods of treatment have been discussed with the patient and family. After consideration of risks, benefits and other options for treatment, the patient has consented to  Procedure(s): XI ROBOTIC ASSISTED LAPAROSCOPIC RADICAL UPPER VAGINECTOMY (N/A) LYMPH NODE BIOPSY (N/A) as a surgical intervention.  The patient's history has been reviewed, patient examined, no change in status, stable for surgery.  I have reviewed the patient's chart and labs.  Questions were answered to the patient's satisfaction.     Thereasa Solo

## 2019-07-22 NOTE — Anesthesia Procedure Notes (Signed)
Procedure Name: Intubation Date/Time: 07/22/2019 1:00 PM Performed by: Glory Buff, CRNA Pre-anesthesia Checklist: Patient identified, Emergency Drugs available, Suction available and Patient being monitored Patient Re-evaluated:Patient Re-evaluated prior to induction Oxygen Delivery Method: Circle system utilized Preoxygenation: Pre-oxygenation with 100% oxygen Induction Type: IV induction Ventilation: Mask ventilation without difficulty Laryngoscope Size: Miller and 2 Grade View: Grade I Tube type: Oral Tube size: 7.0 mm Number of attempts: 1 Airway Equipment and Method: Stylet and Oral airway Placement Confirmation: ETT inserted through vocal cords under direct vision,  positive ETCO2 and breath sounds checked- equal and bilateral Secured at: 20 cm Tube secured with: Tape Dental Injury: Teeth and Oropharynx as per pre-operative assessment  Comments: Intubation by Dr Daiva Huge.

## 2019-07-22 NOTE — Anesthesia Preprocedure Evaluation (Addendum)
Anesthesia Evaluation  Patient identified by MRN, date of birth, ID band Patient awake    Reviewed: Allergy & Precautions, NPO status , Patient's Chart, lab work & pertinent test results  History of Anesthesia Complications Negative for: history of anesthetic complications  Airway Mallampati: II  TM Distance: >3 FB Neck ROM: Full    Dental  (+) Upper Dentures   Pulmonary neg pulmonary ROS,    Pulmonary exam normal        Cardiovascular negative cardio ROS Normal cardiovascular exam     Neuro/Psych Anxiety Bipolar Disorder negative neurological ROS  negative psych ROS   GI/Hepatic negative GI ROS, Neg liver ROS,   Endo/Other  Morbid obesity  Renal/GU negative Renal ROS   Vaginal ca    Musculoskeletal negative musculoskeletal ROS (+)   Abdominal   Peds  Hematology Hgb 15.1, plts 147   Anesthesia Other Findings Day of surgery medications reviewed with patient.  Reproductive/Obstetrics negative OB ROS                            Anesthesia Physical Anesthesia Plan  ASA: III  Anesthesia Plan: General   Post-op Pain Management:    Induction: Intravenous  PONV Risk Score and Plan: 4 or greater and Treatment may vary due to age or medical condition, Ondansetron, Dexamethasone, Midazolam and Scopolamine patch - Pre-op  Airway Management Planned: Oral ETT  Additional Equipment: None  Intra-op Plan:   Post-operative Plan: Extubation in OR  Informed Consent: I have reviewed the patients History and Physical, chart, labs and discussed the procedure including the risks, benefits and alternatives for the proposed anesthesia with the patient or authorized representative who has indicated his/her understanding and acceptance.     Dental advisory given  Plan Discussed with: CRNA  Anesthesia Plan Comments:        Anesthesia Quick Evaluation

## 2019-07-22 NOTE — Anesthesia Postprocedure Evaluation (Signed)
Anesthesia Post Note  Patient: Jordan [name]  Procedure(s) Performed: XI ROBOTIC ASSISTED LAPAROSCOPIC RADICAL UPPER VAGINECTOMY, LEFT SALPINECTOMY, RIGHT SALPINGOOOPHERECTOMY (N/A ) LYMPH NODE BIOPSY (Bilateral )     Patient location during evaluation: PACU Anesthesia Type: General Level of consciousness: awake and alert and oriented Pain management: pain level controlled Vital Signs Assessment: post-procedure vital signs reviewed and stable Respiratory status: spontaneous breathing, nonlabored ventilation and respiratory function stable Cardiovascular status: blood pressure returned to baseline Postop Assessment: no apparent nausea or vomiting Anesthetic complications: no    Last Vitals:  Vitals:   07/22/19 1700 07/22/19 1728  BP: (!) 143/92 (!) 157/81  Pulse: 79 81  Resp: 15 18  Temp:  36.7 C  SpO2: 99% 100%    Last Pain:  Vitals:   07/22/19 1740  TempSrc:   PainSc: [place]

## 2019-07-22 NOTE — H&P (Deleted)
Jordan [name] is an 53 y.o. female.   Chief Complaint: Pelvic pain HPI: Jordan [name]  is a 53 y.o.  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  She presents w/worsening pelvic pain refractory to OTC medications. She is scheduled to undergo an upper vaginectomy tomorrow.  Past Medical History:  Diagnosis Date  . Alcohol abuse   . Anemia    patient denies  . Bipolar 1 disorder (HCC)    No medications currently  . CAP (community acquired pneumonia) 03/17/2015  . 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 Mental disorder   . Obesity   . PICC line infection 05/17/2015  . Sepsis due to Gram negative bacteria (MDR E Coli) 02/18/2015  . 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[place])   . UTI (lower urinary tract infection)   . Vaginal Pap smear, abnormal     Past Surgical History:  Procedure Laterality Date  . ABDOMINAL HYSTERECTOMY    . CERVICAL CONIZATION W/BX N/A 07/01/2016   Procedure: CONIZATION CERVIX WITH BIOPSY;  Surgeon: Chancy Milroy, MD;  Location: [facility];  Service: Gynecology;  Laterality: N/A;  . CHOLECYSTECTOMY    . HYSTEROSCOPY W/D&C N/A 01/25/2016   Procedure: DILATATION AND CURETTAGE /HYSTEROSCOPY;  Surgeon: Mora Bellman, MD;  Location: [facility];  Service: Gynecology;  Laterality: N/A;  . VAGINAL HYSTERECTOMY N/A 03/11/2017   Procedure: HYSTERECTOMY VAGINAL WITH MORCELLATION;  Surgeon: Chancy Milroy, MD;  Location: [facility];  Service: Gynecology;  Laterality: N/A;    Family History  Problem Relation Age of Onset  . Alcohol abuse Father   . Cancer Father   . Breast cancer Neg Hx    Social History:  reports that she has never smoked. She has never used smokeless tobacco. She reports that she does not drink alcohol or use drugs.  Allergies:  Allergies  Allergen Reactions  . Ciprofloxacin Swelling    Lips swell, tongue swells, face swells  . Citalopram Other (See Comments)    Possible cause of pancytopenia. Swelling of tongue, face  and throat  . Lamotrigine Other (See Comments)    Possible cause of pancytopenia. Swelling of face, throat and tongue  . Sulfa Antibiotics Anaphylaxis  . Tramadol Anaphylaxis, Shortness Of Breath and Swelling    Medications Prior to Admission  Medication Sig Dispense Refill  . acetaminophen (TYLENOL) 500 MG tablet Take 500-1,000 mg by mouth every 6 (six) hours as needed for moderate pain.     Marland Kitchen lurasidone (LATUDA) 20 MG TABS tablet Take 20 mg by mouth daily.    . Oxycodone HCl 10 MG TABS Take 10 mg by mouth 2 (two) times daily as needed (pain).     . Vitamin D, Ergocalciferol, (DRISDOL) 1.25 MG (50000 UT) CAPS capsule Take 50,000 Units by mouth once a week.    . nitrofurantoin, macrocrystal-monohydrate, (MACROBID) 100 MG capsule Take 1 capsule (100 mg total) by mouth 2 (two) times daily. 10 capsule 0  .ris  No results found for this or any previous visit (from the past 20 hour(s)). No results found.  Review of Systems  Constitutional: Negative.   Genitourinary:       Pelvic pain    There were no vitals taken for this visit. Physical Exam  Constitutional: She is oriented to person, place, and time. She appears well-developed. She appears distressed.  Neck: Neck supple.  Cardiovascular: Normal rate, regular rhythm and normal heart sounds.  Respiratory: Breath sounds normal.  GI: Soft. There is  no abdominal tenderness.  Musculoskeletal:        General: No edema.  Neurological: She is alert and oriented to person, place, and time.     Assessment/Plan Patient w/an early stage vaginal cancer w/pelvic pain.  Pain is out of the proportion to the low tumor burden; likely other etiology for her pain  >Admit for pain control preoperatively >Consider extended course of a medication w/efficacy in treating neuropathic pain postoperatively    Lahoma Crocker, MD 07/22/2019, 7:37 AM

## 2019-07-22 NOTE — Progress Notes (Addendum)
2 bags and wheelchair transported to floor 1524.

## 2019-07-22 NOTE — Transfer of Care (Signed)
Immediate Anesthesia Transfer of Care Note  Patient: Jordan [name]  Procedure(s) Performed: XI ROBOTIC ASSISTED LAPAROSCOPIC RADICAL UPPER VAGINECTOMY, LEFT SALPINECTOMY, RIGHT SALPINGOOOPHERECTOMY (N/A ) LYMPH NODE BIOPSY (Bilateral )  Patient Location: PACU  Anesthesia Type:General  Level of Consciousness: drowsy, patient cooperative and responds to stimulation  Airway & Oxygen Therapy: Patient Spontanous Breathing and Patient connected to face mask oxygen  Post-op Assessment: Report given to RN and Post -op Vital signs reviewed and stable  Post vital signs: Reviewed and stable  Last Vitals:  Vitals Value Taken Time  BP    Temp    Pulse    Resp 15 07/22/19 1608  SpO2    Vitals shown include unvalidated device data.  Last Pain:  Vitals:   07/22/19 1031  TempSrc:   PainSc: 0-No pain      Patients Stated Pain Goal: 3 ([id number])  Complications: No apparent anesthesia complications

## 2019-07-22 NOTE — Op Note (Signed)
OPERATIVE NOTE 07/22/19  Surgeon: Donaciano Eva   Assistants: Lahoma Crocker (an MD assistant was necessary for tissue manipulation, management of robotic instrumentation, retraction and positioning due to the complexity of the case and hospital policies).   Anesthesia: General endotracheal anesthesia  ASA Class: 3   Pre-operative Diagnosis: vaginal cancer, clinical stage I  Post-operative Diagnosis: same  Operation: Robotic-assisted radical vaginectomy with bilateral pelvic SLN biopsy, right salpingo-oophorectomy, left salpingectomy.   Surgeon: Donaciano Eva  Assistant Surgeon: Lahoma Crocker MD  Anesthesia: GET  Urine Output: 150cc  Operative Findings:  : no visible vaginal lesions.  Ovaries and fallopian tube adherent to the rectum and vaginal cuff.  Grossly normal-appearing ovaries but densely adherent.  No suspicious lymph nodes.  Clinical stage I disease.  Estimated Blood Loss:  less than 50 mL      Total IV Fluids: 800 ml         Specimens: Upper vagina with parametrium with marking stitch at 12:00 midline anterior distal vaginal margin, right obturator sentinel lymph node, right presacral sentinel lymph node, left external iliac sentinel lymph node, left fallopian tube, right ovary and tube.          Complications:  None; patient tolerated the procedure well.         Disposition: PACU - hemodynamically stable.  Procedure Details  The patient was seen in the Holding Room. The risks, benefits, complications, treatment options, and expected outcomes were discussed with the patient.  The patient concurred with the proposed plan, giving informed consent.  The site of surgery properly noted/marked. The patient was identified as Jordan [name] and the procedure verified as a Robotic-assisted radical vaginectomy and SLN biopsy. A Time Out was held and the above information confirmed.  After induction of anesthesia, the patient was draped and prepped in the  usual sterile manner. Pt was placed in supine position after anesthesia and draped and prepped in the usual sterile manner. The abdominal drape was placed after the CholoraPrep had been allowed to dry for 3 minutes.  Her arms were tucked to her side with all appropriate precautions.  The chest was secured to the table.  The patient was placed in the semi-lithotomy position in [place].  The perineum was prepped with Betadine.  Foley catheter was placed.  A speculum was inserted into the vagina and the vagina was visualized entirely.  4% acetic acid was placed within the vagina to attempt to visualize the carcinoma and this was not identified.  Representative injection circumferentially around the vaginal cuff and a 2 mm depth were made of 0.5 mg/mL of ICG a total of 5 cc.  An EEA sizer (large size) was placed vaginally to define the vaginal fornices.  A second time-out was performed.  OG tube placement was confirmed and to suction.    Next, a 5 mm skin incision was made 1 cm below the subcostal margin in the midclavicular line.  The 5 mm Optiview port and scope was used for direct entry.  Opening pressure was under 10 mm CO2.  The abdomen was insufflated and the findings were noted as above.   At this point and all points during the procedure, the patient's intra-abdominal pressure did not exceed 15 mmHg. Next, a 10 mm skin incision was made at the umbilicus and a right and left port was placed about 10 cm lateral to the robot port on the right and left side.  A fourth arm was placed in the left lower  quadrant 2 cm above and superior and medial to the anterior superior iliac spine.  All ports were placed under direct visualization.  The patient was placed in steep Trendelenburg.  Bowel was away into the upper abdomen.  The robot was docked in the normal manner.  There were dense adhesions to the vaginal cuff of the ovaries bilaterally which refused in the midline.  Sharp adhesio lysis was performed for 40  minutes to dissected the ovaries from each other and from the rectum posteriorly in the midline.  In the process of doing this there was bleeding from the right infundibulopelvic ligament which resulted in necessitating sacrificing the right tube and ovary.  The right and left peritoneum were opened parallel to the IP ligament to open the retroperitoneal spaces bilaterally. The SLN mapping was performed in bilateral pelvic basins. The para rectal and paravesical spaces were opened up. Lymphatic channels were identified travelling to the following visualized sentinel lymph node's: Right obturator, right presacral, left external iliac. These SLN's were separated from their surrounding lymphatic tissue, removed and sent for permanent pathology.  The bladder flap was developed in the midline with sharp and monopolar dissection.   The radical vaginectomy was begun by first skeletonizing the right uterine artery at its origin from the internal iliac artery by skeletonizing it 360 degrees and defining the parauterine web between the paravesical and pararectal spaces. The right ureter was skeletonized off of its attachments to the broad ligament and mobilized laterally. Using meticulous blunt and sparing monopolar dissection in short controlled bursts, the right ureter was untunnelled from under the residual right uterine artery. The anterior vessicouterine ligament was developed and the bladder flap was taken down to below the level of the tumor and below the rounded portion of the EEA sizer. This then definied the anterior bladder pillar. The uterine artery was bipolar fulgarated to seal it, then transected. The uterine vein was also sealed and resected. The lateral boundary of the parametrium was taken down with biploar and monopolar dissection to the level of the deep vaginal vein. The residual uterine vessels were retracted superior and medially over the ureter on the right. The ureter was untunnelled through to  its entry into the bladder with meticulous sharp dissection and short bursts of monopolar energy. The ureter was dissected off of its attachments to the anterior vagina. The anterior bladder pillar was skeletonized and sealed with bipolar energy while the ureter was deflected distally. The posterior bladder pillar was also dissected with monopolar scissors from the upper vagina.  The rectovaginal septum was entered posteriorally with sharp dissection and the rectum was dissected off of the vagina.  In the process of doing this on the right side there was unavoidably entry into the vagina.  The rectovaginal septal plane was reestablished and additional margin was made beyond this entry point.  A window was created in the broad ligament with care to mobilize the ureter laterally. This skeletonized the IP ligament which was sealed with bipolar energy and transected.The right uterosacral ligament was transected with bipolar and monopolar energy within the first third of the uterosacral ligament.  The hypogastric nerves were spared.. The right paravaginal tissues were tubularized around the vagina on the right at the inferior boundary of the dissection.  The left radical vaginectomy was then performed in the same manner as the right.    The colpotomy was made and the upper vagina, and parametrium were amputated and delivered through the vagina.  The right tube and ovary  was removed from the vaginal cuff as was the left fallopian tube which had been dissected free from the left ovary.  The left ovary remained in situ and appeared viable with viable blood supply.  Pedicles were inspected and excellent hemostasis was achieved.    The colpotomy at the vaginal cuff was closed with two 0-Vicryl on a CT1 needle in a running manner.  The anterior rectal wall was imbricated with 3-0 interrupted sutures as it had been thinned out during the dissection to develop the rectovaginal septum and to separate the densely adherent  ovaries from the anterior rectal wall.  There is no breach of the rectal wall and no colotomy.  The peritoneum of the posterior cul-de-sac was reapproximated to the posterior vaginal cuff to further imbricate the anterior rectal wall.  This was done with a running 0 Vicryl suture.  Irrigation was used and excellent hemostasis was achieved.  At this point in the procedure was completed.  Robotic instruments were removed under direct visulaization.  The robot was undocked. The 10 mm ports were closed with Vicryl on a UR-5 needle and the fascia was closed with 0 Vicryl on a UR-5 needle.  The skin was closed with 4-0 Vicryl in a subcuticular manner.  Dermabond was applied.  Sponge, lap and needle counts correct x 2.  The patient was taken to the recovery room in stable condition.  The vagina was swabbed with  minimal bleeding noted.   All instrument and needle counts were correct x  3.   The patient was transferred to the recovery room in a stable condition.  Donaciano Eva, MD

## 2019-07-23 ENCOUNTER — Encounter (HOSPITAL_COMMUNITY): Payer: Self-pay | Admitting: Gynecologic Oncology

## 2019-07-23 DIAGNOSIS — F603 Borderline personality disorder: Secondary | ICD-10-CM | POA: Diagnosis present

## 2019-07-23 DIAGNOSIS — F319 Bipolar disorder, unspecified: Secondary | ICD-10-CM | POA: Diagnosis present

## 2019-07-23 DIAGNOSIS — Z20828 Contact with and (suspected) exposure to other viral communicable diseases: Secondary | ICD-10-CM | POA: Diagnosis present

## 2019-07-23 DIAGNOSIS — F17200 Nicotine dependence, unspecified, uncomplicated: Secondary | ICD-10-CM | POA: Diagnosis present

## 2019-07-23 DIAGNOSIS — Z8619 Personal history of other infectious and parasitic diseases: Secondary | ICD-10-CM | POA: Diagnosis not present

## 2019-07-23 DIAGNOSIS — Z888 Allergy status to other drugs, medicaments and biological substances status: Secondary | ICD-10-CM | POA: Diagnosis not present

## 2019-07-23 DIAGNOSIS — G8929 Other chronic pain: Secondary | ICD-10-CM | POA: Diagnosis present

## 2019-07-23 DIAGNOSIS — Z90710 Acquired absence of both cervix and uterus: Secondary | ICD-10-CM | POA: Diagnosis not present

## 2019-07-23 DIAGNOSIS — C519 Malignant neoplasm of vulva, unspecified: Secondary | ICD-10-CM | POA: Diagnosis present

## 2019-07-23 DIAGNOSIS — C52 Malignant neoplasm of vagina: Secondary | ICD-10-CM | POA: Diagnosis present

## 2019-07-23 DIAGNOSIS — Z6841 Body Mass Index (BMI) 40.0 and over, adult: Secondary | ICD-10-CM | POA: Diagnosis not present

## 2019-07-23 DIAGNOSIS — Z86001 Personal history of in-situ neoplasm of cervix uteri: Secondary | ICD-10-CM | POA: Diagnosis not present

## 2019-07-23 DIAGNOSIS — F419 Anxiety disorder, unspecified: Secondary | ICD-10-CM | POA: Diagnosis present

## 2019-07-23 DIAGNOSIS — K660 Peritoneal adhesions (postprocedural) (postinfection): Secondary | ICD-10-CM | POA: Diagnosis present

## 2019-07-23 DIAGNOSIS — Z881 Allergy status to other antibiotic agents status: Secondary | ICD-10-CM | POA: Diagnosis not present

## 2019-07-23 DIAGNOSIS — Z882 Allergy status to sulfonamides status: Secondary | ICD-10-CM | POA: Diagnosis not present

## 2019-07-23 LAB — BASIC METABOLIC PANEL
Anion gap: 8 (ref 5–15)
BUN: 17 mg/dL (ref 6–20)
CO2: 23 mmol/L (ref 22–32)
Calcium: 8.8 mg/dL — ABNORMAL LOW (ref 8.9–10.3)
Chloride: 107 mmol/L (ref 98–111)
Creatinine, Ser: 0.79 mg/dL (ref 0.44–1.00)
GFR calc Af Amer: >60 mL/min (ref >60)
GFR calc non Af Amer: >60 mL/min (ref >60)
Glucose, Bld: 113 mg/dL — ABNORMAL HIGH (ref 70–99)
Potassium: 4.4 mmol/L (ref 3.5–5.1)
Sodium: 138 mmol/L (ref 135–145)

## 2019-07-23 LAB — URINE CULTURE: Culture: >=100000 — AB

## 2019-07-23 LAB — CBC
HCT: 40 % (ref 36.0–46.0)
Hemoglobin: 13 g/dL (ref 12.0–15.0)
MCH: 30.7 pg (ref 26.0–34.0)
MCHC: 32.5 g/dL (ref 30.0–36.0)
MCV: 94.3 fL (ref 80.0–100.0)
Platelets: 158 10*3/uL (ref 150–400)
RBC: 4.24 MIL/uL (ref 3.87–5.11)
RDW: 12.3 % (ref 11.5–15.5)
WBC: 6.7 10*3/uL (ref 4.0–10.5)
nRBC: 0 % (ref 0.0–0.2)

## 2019-07-23 MED ORDER — IBUPROFEN 800 MG PO TABS: 800.0000 mg | ORAL_TABLET | Freq: Three times a day (TID) | ORAL | 0 refills | Status: DC | PRN

## 2019-07-23 MED ORDER — OXYCODONE HCL 5 MG PO TABS: 5.0000 mg | ORAL_TABLET | ORAL | 0 refills | Status: DC | PRN

## 2019-07-23 MED ORDER — CEPHALEXIN 500 MG PO CAPS: 500.0000 mg | ORAL_CAPSULE | Freq: Four times a day (QID) | ORAL | 0 refills | Status: DC

## 2019-07-23 MED ORDER — SENNOSIDES-DOCUSATE SODIUM 8.6-50 MG PO TABS: 2.0000 | ORAL_TABLET | Freq: Every day | ORAL | 0 refills | Status: DC

## 2019-07-23 MED ORDER — CEPHALEXIN 500 MG PO CAPS
500.0000 mg | ORAL_CAPSULE | Freq: Four times a day (QID) | ORAL | Status: DC
  Administered 2019-07-23: 500 mg via ORAL
  Filled 2019-07-23: qty 1

## 2019-07-23 NOTE — Discharge Summary (Signed)
Physician Discharge Summary  Patient ID: Jordan [name] MRN: [id number] DOB/AGE: 1965/12/24 53 y.o.  Admit date: 07/22/2019 Discharge date: 07/23/2019  Admission Diagnoses: Vaginal cancer [facility])  Discharge Diagnoses:  Principal Problem:   Vaginal cancer ([place]) Active Problems:   Urinary tract infection   Morbid obesity with BMI of 40.0-44.9, adult [facility])   Discharged Condition: good  Hospital Course:  1/ patient was admitted on 07/22/19 for a robotic assisted radical vaginectomy and SLN pelvic lymph node biopsy for clinical stage I (microscopic) vaginal cancer. 2/ surgery was uncomplicated  3/ on postoperative day 1 the patient was meeting discharge criteria: tolerating PO, voiding urine, ambulating, pain well controlled on oral medications. She initially stated that she "could not walk" but had normal motor function in her legs and normal vital signs with a normal Hb on lab evaluation. I discussed with Areana that I believed her fear of walking was secondary to her mental health issues and her anxiety and that there was no objective reason why she could not walk or be discharged.  4/ new medications on discharge include oxycodone, ibuprofen, sennakot.   Consults: None  Significant Diagnostic Studies: labs:  CBC    Component Value Date/Time   WBC 6.7 07/23/2019 0305   RBC 4.24 07/23/2019 0305   HGB 13.0 07/23/2019 0305   HCT 40.0 07/23/2019 0305   PLT 158 07/23/2019 0305   MCV 94.3 07/23/2019 0305   MCH 30.7 07/23/2019 0305   MCHC 32.5 07/23/2019 0305   RDW 12.3 07/23/2019 0305   LYMPHSABS 1.2 01/17/2019 1652   MONOABS 0.3 01/17/2019 1652   EOSABS 0.0 01/17/2019 1652   BASOSABS 0.0 01/17/2019 1652   BMET    Component Value Date/Time   NA 138 07/23/2019 0305   K 4.4 07/23/2019 0305   CL 107 07/23/2019 0305   CO2 23 07/23/2019 0305   GLUCOSE 113 (H) 07/23/2019 0305   BUN 17 07/23/2019 0305   CREATININE 0.79 07/23/2019 0305   CALCIUM 8.8 (L) 07/23/2019 0305   GFRNONAA >60  07/23/2019 0305   GFRAA >60 07/23/2019 0305     Treatments: surgery: see above  Discharge Exam: Blood pressure 120/68, pulse 78, temperature 99.4 F (37.4 C), temperature source Oral, resp. rate 20, SpO2 97 %. General appearance: alert, cooperative and distracted GI: soft, non-tender; bowel sounds normal; no masses,  no organomegaly Incision/Wound: clean and in tact x 5  Disposition: Discharge disposition: 01-Home or Self Care       Discharge Instructions    (HEART FAILURE PATIENTS) Call MD:  Anytime you have any of the following symptoms: 1) 3 pound weight gain in 24 hours or 5 pounds in 1 week 2) shortness of breath, with or without a dry hacking cough 3) swelling in the hands, feet or stomach 4) if you have to sleep on extra pillows at night in order to breathe.   Complete by: As directed    Call MD for:  difficulty breathing, headache or visual disturbances   Complete by: As directed    Call MD for:  extreme fatigue   Complete by: As directed    Call MD for:  hives   Complete by: As directed    Call MD for:  persistant dizziness or light-headedness   Complete by: As directed    Call MD for:  persistant nausea and vomiting   Complete by: As directed    Call MD for:  redness, tenderness, or signs of infection (pain, swelling, redness, odor or green/yellow discharge around  incision site)   Complete by: As directed    Call MD for:  severe uncontrolled pain   Complete by: As directed    Call MD for:  temperature >100.4   Complete by: As directed    Diet - low sodium heart healthy   Complete by: As directed    Diet general   Complete by: As directed    Driving Restrictions   Complete by: As directed    No driving for 7 days or until off narcotic pain medication   Increase activity slowly   Complete by: As directed    Remove dressing in 24 hours   Complete by: As directed    Sexual Activity Restrictions   Complete by: As directed    No intercourse for 6 weeks      Allergies as of 07/23/2019      Reactions   Ciprofloxacin Swelling   Lips swell, tongue swells, face swells   Citalopram Other (See Comments)   Possible cause of pancytopenia. Swelling of tongue, face and throat   Lamotrigine Other (See Comments)   Possible cause of pancytopenia. Swelling of face, throat and tongue   Sulfa Antibiotics Anaphylaxis   Tramadol Anaphylaxis, Shortness Of Breath, Swelling      Medication List    TAKE these medications   acetaminophen 500 MG tablet Commonly known as: TYLENOL Take 500-1,000 mg by mouth every 6 (six) hours as needed for moderate pain.   ibuprofen 800 MG tablet Commonly known as: ADVIL Take 1 tablet (800 mg total) by mouth every 8 (eight) hours as needed.   Latuda 20 MG Tabs tablet Generic drug: lurasidone Take 20 mg by mouth daily.   nitrofurantoin (macrocrystal-monohydrate) 100 MG capsule Commonly known as: Macrobid Take 1 capsule (100 mg total) by mouth 2 (two) times daily.   Oxycodone HCl 10 MG Tabs Take 10 mg by mouth 2 (two) times daily as needed (pain). What changed: Another medication with the same name was added. Make sure you understand how and when to take each.   oxyCODONE 5 MG immediate release tablet Commonly known as: Oxy IR/ROXICODONE Take 1 tablet (5 mg total) by mouth every 4 (four) hours as needed for severe pain or breakthrough pain. What changed: You were already taking a medication with the same name, and this prescription was added. Make sure you understand how and when to take each.   senna-docusate 8.6-50 MG tablet Commonly known as: Senokot-S Take 2 tablets by mouth at bedtime.   Vitamin D (Ergocalciferol) 1.25 MG (50000 UT) Caps capsule Commonly known as: DRISDOL Take 50,000 Units by mouth once a week.        Signed: Thereasa Solo 07/23/2019, 8:50 AM

## 2019-07-23 NOTE — Progress Notes (Signed)
Patient was dangle and stood up for a minute, but stated she was dizzy and lightheaded.

## 2019-07-23 NOTE — Progress Notes (Signed)
Pt. Requested to go home via ambulance, informed that the ambulance would not be able to transport her personal wheelchair.  She then requested to be discharged to a nursing home, paged Joylene John NP, stated that she is not medically appropriate for discharge to nursing home.  Patient voided on herself, and walked a lap around nurses station. Attempted to discuss discharge instructions, patient not very receptive, she snatched the discharge papers from this nurses hand and threw them across the room" I cant read anyway."  Patient states, "Im going to go home and get in the bed, and lay there until I heal or die, whichever happens first."  Patient called wheelchair transportation to take her home.

## 2019-07-23 NOTE — Discharge Instructions (Signed)
07/23/2019  Return to work: 4 weeks  Activity: 1. Be up and out of the bed during the day.  Take a nap if needed.  You may walk up steps but be careful and use the hand rail.  Stair climbing will tire you more than you think, you may need to stop part way and rest.   2. No lifting or straining for 4 weeks. This means that you shouldn't lift things that require two hands to lift them. Anything lighter than that is safe to lift.  3. No driving for 1 weeks.  Do Not drive if you are taking narcotic pain medicine.  4. Shower daily.  Use soap and water on your incision and pat dry; don't rub.   5. No sexual activity and nothing in the vagina for 12 weeks.  Medications:  - Take ibuprofen and tylenol first line for pain control. Take these regularly (every 6 hours) to decrease the build up of pain.  - If necessary, for severe pain not relieved by ibuprofen, take oxycodone.  - YOU HAVE BEEN PRESCRIBED 10 TABLETS OF THIS FOR POSTOP PAIN. BY LAW DR Garry Heater REFILL THIS PRESCRIPTION UNLESS IT IS FOR A CONDITION OTHER THAN SURGERY. PLEASE DO NOT CONTACT THE OFFICE FOR REFILL REQUESTS OF OXYCODONE.   - While taking oxycodone you should take sennakot every night to reduce the likelihood of constipation. If this causes diarrhea, stop its use.  Diet: 1. Low sodium Heart Healthy Diet is recommended.  2. It is safe to use a laxative if you have difficulty moving your bowels.   Wound Care: 1. Keep clean and dry.  Shower daily.  Reasons to call the Doctor:   Fever - Oral temperature greater than 100.4 degrees Fahrenheit  Foul-smelling vaginal discharge  Difficulty urinating  Nausea and vomiting  Increased pain at the site of the incision that is unrelieved with pain medicine.  Difficulty breathing with or without chest pain  New calf pain especially if only on one side  Sudden, continuing increased vaginal bleeding with or without clots.   Follow-up: 1. See Everitt Amber in 3 weeks.  She will contact you with the results of the pathology evaluation of the tissue she removed in 1 week.   Contacts: For questions or concerns you should contact:  Dr. Everitt Amber at [phone] After hours and on week-ends call [phone] and ask to speak to the physician on call for Gynecologic Oncology

## 2019-07-26 ENCOUNTER — Telehealth: Payer: Self-pay | Admitting: *Deleted

## 2019-07-26 NOTE — Telephone Encounter (Signed)
Returned the patient's call and answered post op questions. Patient wanted to know if she had a phone or in person visit on 10/9. Explained that the appt was on the phone. Patient requested in person visit. Appt changed per patient request. Patient last asked if she could go out to the store. Explained that she can go out to the store, but to take it easy and make it short times out. Also reminded the patient to wear a mask.

## 2019-07-27 LAB — SURGICAL PATHOLOGY

## 2019-07-28 ENCOUNTER — Telehealth: Payer: Self-pay | Admitting: *Deleted

## 2019-07-28 NOTE — Telephone Encounter (Signed)
Returned the patient's call and explained that she can go out to the store. Reminded the patient to wear a mask and to take it easy, not to be out all day

## 2019-07-30 ENCOUNTER — Encounter

## 2019-07-30 ENCOUNTER — Encounter: Payer: Self-pay | Admitting: Gynecologic Oncology

## 2019-07-30 ENCOUNTER — Inpatient Hospital Stay: Payer: Medicaid Other | Attending: Gynecologic Oncology | Admitting: Gynecologic Oncology

## 2019-07-30 ENCOUNTER — Other Ambulatory Visit: Payer: Self-pay

## 2019-07-30 VITALS — BP 120/64 | HR 106 | Temp 98.7°F | Resp 20 | Ht 68.0 in | Wt 284.4 lb

## 2019-07-30 DIAGNOSIS — Z90722 Acquired absence of ovaries, bilateral: Secondary | ICD-10-CM

## 2019-07-30 DIAGNOSIS — F319 Bipolar disorder, unspecified: Secondary | ICD-10-CM | POA: Diagnosis not present

## 2019-07-30 DIAGNOSIS — E669 Obesity, unspecified: Secondary | ICD-10-CM | POA: Insufficient documentation

## 2019-07-30 DIAGNOSIS — Z7189 Other specified counseling: Secondary | ICD-10-CM | POA: Diagnosis not present

## 2019-07-30 DIAGNOSIS — C52 Malignant neoplasm of vagina: Secondary | ICD-10-CM | POA: Insufficient documentation

## 2019-07-30 DIAGNOSIS — Z6841 Body Mass Index (BMI) 40.0 and over, adult: Secondary | ICD-10-CM | POA: Diagnosis not present

## 2019-07-30 DIAGNOSIS — F17210 Nicotine dependence, cigarettes, uncomplicated: Secondary | ICD-10-CM | POA: Diagnosis not present

## 2019-07-30 DIAGNOSIS — Z90710 Acquired absence of both cervix and uterus: Secondary | ICD-10-CM | POA: Insufficient documentation

## 2019-07-30 MED ORDER — ACETAMINOPHEN 500 MG PO TABS: 500.0000 mg | ORAL_TABLET | Freq: Four times a day (QID) | ORAL | 1 refills | Status: DC | PRN

## 2019-07-30 MED ORDER — IBUPROFEN 800 MG PO TABS: 800.0000 mg | ORAL_TABLET | Freq: Three times a day (TID) | ORAL | 1 refills | Status: DC | PRN

## 2019-07-30 NOTE — Patient Instructions (Addendum)
Dr Denman George is recommending radiation (5 treatments to the vagina) to kill of the remaining cancer cells that were found at the edge of the specimen that she removed.  It was a stage I cancer. There is a high chance of cure with radiation.  However, without radiation, there is a very high chance it will regrow back and not be curable.   Dr Serita Grit office will set you up to see Dr Sondra Come, the radiation specialist to discuss further.  She will see you back in 2 weeks to check on your incisions.   You need to take Tylenol and ibuprofen alternating every six hours.  New prescriptions have been sent in.

## 2019-07-30 NOTE — Progress Notes (Signed)
Follow-up Note: Gyn-Onc  Consult was requested by Dr. Rip Harbour for the evaluation of Jordan [name] 53 y.o. female  CC:  Chief Complaint  Patient presents with  . Vaginal Cancer    Assessment/Plan:  Jordan [name]  is a 53 y.o.  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 Positive margins on specimen.  Today I discussed her pathology results.  I discussed that the lesion was microscopic, very small, and deep margins were negative, however the posterior margins were positive due to the nonvisible nature of this lesion.  For this reason I am recommending adjuvant radiation to the vaginal cuff to consolidate margin status.  She will return to see me in the office in 2 weeks to assess healing.  She looks very well today and appears comfortable.   HPI: Jordan [name] is a 53 year old P1 who is seen in consultation at the request of Dr Rip Harbour for squamous cell carcinoma of the vulva.  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  A follow-up Pap smear in 2018 showed H SIL.  She then underwent a vaginal hysterectomy which confirmed residual CIN-3 with negative margins at the hysterectomy specimen.  She underwent a Pap test for surveillance in April 21, 2019 which revealed the AIN 2-3, H SIL.  Subsequently a vaginal colposcopy was performed on May 28, 2019.  No visible lesions were seen.  There was minimal acetowhite changes noted after acetic acid was applied that was not noted which location.  Biopsies were obtained from the vagina.  Final pathology revealed poorly differentiated squamous cell carcinoma.  The tumor cells were positive for CK 5 6, p63, and P 16.  The patient has a history of bipolar disorder.  She has been treated with medical therapies for this.  She has a history of obesity with a BMI of 41 kg/m.  The patient is a chronic tobacco user.  She has a history of PICC  line infection with sepsis due to gram-negative bacteria.  She has had one prior vaginal delivery.  She is currently not employed.   On June 18, 2019 she underwent staging CT scan of the chest abdomen and pelvis.  This revealed that she was status post hysterectomy with no evidence of metastatic disease in the abdomen or pelvis or chest.  There was a 6 mm right lower lobe that was grossly unchanged from a 2017 CT scan felt to be benign.  A compression fracture was seen at L2 unchanged from prior radiographs.  An MRI of the pelvis with IV contrast was performed on June 26, 2019.  It revealed susceptibility artifact in the vaginal cuff from suture material limiting evaluation.  However no focal soft tissue masses were seen involving the vaginal cuff and no abnormal soft tissue densities in the parametrial regions.  The right and left ovaries were grossly normal.   Interval Hx:  On July 22, 2019 she underwent a robotic assisted radical upper vaginectomy with bilateral pelvic sentinel lymph node biopsy, right salpingo-oophorectomy, left salpingectomy.  Intraoperative findings were significant for no grossly visible vaginal lesions.  The ovaries and fallopian tubes were adherent to the rectum and vaginal cuff in the obliterated cul-de-sac.  The ovaries themselves are grossly normal-appearing but densely adherent with no suspicious lymph nodes.  There is clinical stage I disease, microscopic.  Postoperatively final pathology returned as FIGO stage Ia microscopic poorly differentiated squamous  cell carcinoma.  The tumor site was the posterior vaginal wall and measured 0.6 cm.  The posterior margin (the posterior vaginal cuff margin) was broadly positive for tumor cells.  The deep margin was negative.  There was 2.5 mm depth of invasion.  All sentinel lymph nodes were negative for metastases, as was the left fallopian tube and right tube and ovary.  Due to positive margin status she was recommended  adjuvant vaginal radiation postoperatively.   Current Meds:  Outpatient Encounter Medications as of 07/30/2019  Medication Sig  . acetaminophen (TYLENOL) 500 MG tablet Take 1-2 tablets (500-1,000 mg total) by mouth every 6 (six) hours as needed for moderate pain.  . cephALEXin (KEFLEX) 500 MG capsule Take 1 capsule (500 mg total) by mouth 4 (four) times daily.  Marland Kitchen ibuprofen (ADVIL) 800 MG tablet Take 1 tablet (800 mg total) by mouth every 8 (eight) hours as needed.  . lurasidone (LATUDA) 20 MG TABS tablet Take 20 mg by mouth daily.  Marland Kitchen oxyCODONE (OXY IR/ROXICODONE) 5 MG immediate release tablet Take 1 tablet (5 mg total) by mouth every 4 (four) hours as needed for severe pain or breakthrough pain.  . Oxycodone HCl 10 MG TABS Take 10 mg by mouth 2 (two) times daily as needed (pain).   Marland Kitchen senna-docusate (SENOKOT-S) 8.6-50 MG tablet Take 2 tablets by mouth at bedtime.  . Vitamin D, Ergocalciferol, (DRISDOL) 1.25 MG (50000 UT) CAPS capsule Take 50,000 Units by mouth once a week.  . [DISCONTINUED] acetaminophen (TYLENOL) 500 MG tablet Take 500-1,000 mg by mouth every 6 (six) hours as needed for moderate pain.   . [DISCONTINUED] ibuprofen (ADVIL) 800 MG tablet Take 1 tablet (800 mg total) by mouth every 8 (eight) hours as needed.   No facility-administered encounter medications on file as of 07/30/2019.     Allergy:  Allergies  Allergen Reactions  . Ciprofloxacin Swelling    Lips swell, tongue swells, face swells  . Citalopram Other (See Comments)    Possible cause of pancytopenia. Swelling of tongue, face and throat  . Lamotrigine Other (See Comments)    Possible cause of pancytopenia. Swelling of face, throat and tongue  . Sulfa Antibiotics Anaphylaxis  . Tramadol Anaphylaxis, Shortness Of Breath and Swelling    Social Hx:   Social History   Socioeconomic History  . Marital status: Soil scientist    Spouse name: Not on file  . Number of children: Not on file  . Years of education:  Not on file  . Highest education level: Not on file  Occupational History  . Not on file  Social Needs  . Financial resource strain: Not on file  . Food insecurity    Worry: Not on file    Inability: Not on file  . Transportation needs    Medical: Not on file    Non-medical: Not on file  Tobacco Use  . Smoking status: Never Smoker  . Smokeless tobacco: Never Used  Substance and Sexual Activity  . Alcohol use: No  . Drug use: No  . Sexual activity: Yes    Birth control/protection: Post-menopausal, Surgical    Comment: perimenopausal; no sex in years  Lifestyle  . Physical activity    Days per week: Not on file    Minutes per session: Not on file  . Stress: Not on file  Relationships  . Social Herbalist on phone: Not on file    Gets together: Not on file    Attends  religious service: Not on file    Active member of club or organization: Not on file    Attends meetings of clubs or organizations: Not on file    Relationship status: Not on file  . Intimate partner violence    Fear of current or ex partner: Not on file    Emotionally abused: Not on file    Physically abused: Not on file    Forced sexual activity: Not on file  Other Topics Concern  . Not on file  Social History Narrative  . Not on file    Past Surgical Hx:  Past Surgical History:  Procedure Laterality Date  . ABDOMINAL HYSTERECTOMY    . CERVICAL CONIZATION W/BX N/A 07/01/2016   Procedure: CONIZATION CERVIX WITH BIOPSY;  Surgeon: Chancy Milroy, MD;  Location: [facility];  Service: Gynecology;  Laterality: N/A;  . CHOLECYSTECTOMY    . HYSTEROSCOPY W/D&C N/A 01/25/2016   Procedure: DILATATION AND CURETTAGE /HYSTEROSCOPY;  Surgeon: Mora Bellman, MD;  Location: [facility];  Service: Gynecology;  Laterality: N/A;  . LYMPH NODE BIOPSY Bilateral 07/22/2019   Procedure: LYMPH NODE BIOPSY;  Surgeon: Everitt Amber, MD;  Location: [facility];  Service: Gynecology;  Laterality: Bilateral;  . ROBOT ASSISTED MYOMECTOMY  N/A 07/22/2019   Procedure: XI ROBOTIC ASSISTED LAPAROSCOPIC RADICAL UPPER VAGINECTOMY, LEFT SALPINECTOMY, RIGHT SALPINGOOOPHERECTOMY;  Surgeon: Everitt Amber, MD;  Location: [facility];  Service: Gynecology;  Laterality: N/A;  . VAGINAL HYSTERECTOMY N/A 03/11/2017   Procedure: HYSTERECTOMY VAGINAL WITH MORCELLATION;  Surgeon: Chancy Milroy, MD;  Location: [facility];  Service: Gynecology;  Laterality: N/A;    Past Medical Hx:  Past Medical History:  Diagnosis Date  . Alcohol abuse   . Anemia    patient denies  . Bipolar 1 disorder (HCC)    No medications currently  . CAP (community acquired pneumonia) 03/17/2015  . 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 Mental disorder   . Obesity   . PICC line infection 05/17/2015  . Sepsis due to Gram negative bacteria (MDR E Coli) 02/18/2015  . 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[place])   . UTI (lower urinary tract infection)   . Vaginal Pap smear, abnormal     Past Gynecological History:  See HPI No LMP recorded (lmp unknown). Patient has had a hysterectomy.  Family Hx:  Family History  Problem Relation Age of Onset  . Alcohol abuse Father   . Cancer Father   . Breast cancer Neg Hx     Review of Systems:  Constitutional  Feels well,    ENT Normal appearing ears and nares bilaterally Skin/Breast  No rash, sores, jaundice, itching, dryness Cardiovascular  No chest pain, shortness of breath, or edema  Pulmonary  No cough or wheeze.  Gastro Intestinal  No nausea, vomitting, or diarrhoea. No bright red blood per rectum, no abdominal pain, change in bowel movement, or constipation.  Genito Urinary  No frequency, urgency, dysuria, no bleeding or discharge. Not sexually active Musculo Skeletal  No myalgia, arthralgia, joint swelling or pain  Neurologic  No weakness, numbness, change in gait,  Psychology  No depression, anxiety, insomnia.   Vitals:  Blood pressure 120/64, pulse (!) 106, temperature 98.7 F (37.1 C),  temperature source Temporal, resp. rate 20, height 5\' 8"  (1.727 m), weight 284 lb 6 oz (129 kg), SpO2 99 %.  Physical Exam: General: appears in no distress.    30 minutes of direct face to face counseling time was spent with  the patient. This included discussion about prognosis, therapy recommendations and postoperative side effects and are beyond the scope of routine postoperative care.   Thereasa Solo, MD  07/30/2019, 5:07 PM

## 2019-08-02 ENCOUNTER — Telehealth: Payer: Self-pay | Admitting: *Deleted

## 2019-08-02 ENCOUNTER — Other Ambulatory Visit: Payer: Self-pay | Admitting: Oncology

## 2019-08-02 NOTE — Telephone Encounter (Signed)
Patient called and stated "I have an incision on the left top of my stomach and it's red and open. I placed a bandaid on it last night; took it off this morning and there was puss on it. It's not warm to the touch and when I push on it nothing comes out. I have no fever. What ado I need to do?" Message forwarded to [facility] APP

## 2019-08-02 NOTE — Progress Notes (Signed)
Gynecologic Oncology Multi-Disciplinary Disposition Conference Note  Date of the Conference: 08/02/2019  Patient Name: Jordan [name]  Referring Provider: Dr. Rip Harbour Primary GYN Oncologist: [name]  Stage/Disposition:  Stage 1 vaginal squamous cell carcinoma. Disposition is for vaginal brachytherapy.   This Multidisciplinary conference took place involving physicians from [place], [place], Radiation Oncology, Pathology, Radiology along with the Gynecologic Oncology Nurse Practitioner and RN.  Comprehensive assessment of the patient's malignancy, staging, need for surgery, chemotherapy, radiation therapy, and need for further testing were reviewed. Supportive measures, both inpatient and following discharge were also discussed. The recommended plan of care is documented. Greater than 35 minutes were spent correlating and coordinating this patient's care.

## 2019-08-02 NOTE — Telephone Encounter (Signed)
Told Ms Swisher that Joylene John, NP said to Clean are with soap and water and then apply a thin coat of neosporin ointment and then cover with a Band-Aid daily.  Verified radiation appointment for 08-04-19 for patient.

## 2019-08-04 ENCOUNTER — Encounter: Payer: Self-pay | Admitting: Radiation Oncology

## 2019-08-04 ENCOUNTER — Ambulatory Visit
Admission: RE | Admit: 2019-08-04 | Discharge: 2019-08-04 | Disposition: A | Discharge status: Home / Self Care | Payer: Medicaid Other | Source: Ambulatory Visit | Attending: Radiation Oncology | Admitting: Radiation Oncology

## 2019-08-04 ENCOUNTER — Other Ambulatory Visit: Payer: Self-pay

## 2019-08-04 VITALS — BP 129/84 | HR 81 | Temp 98.2°F | Resp 20 | Ht 68.0 in | Wt 282.4 lb

## 2019-08-04 DIAGNOSIS — Z90710 Acquired absence of both cervix and uterus: Secondary | ICD-10-CM | POA: Insufficient documentation

## 2019-08-04 DIAGNOSIS — F1010 Alcohol abuse, uncomplicated: Secondary | ICD-10-CM | POA: Insufficient documentation

## 2019-08-04 DIAGNOSIS — E669 Obesity, unspecified: Secondary | ICD-10-CM | POA: Insufficient documentation

## 2019-08-04 DIAGNOSIS — Z79899 Other long term (current) drug therapy: Secondary | ICD-10-CM | POA: Insufficient documentation

## 2019-08-04 DIAGNOSIS — C52 Malignant neoplasm of vagina: Secondary | ICD-10-CM | POA: Diagnosis present

## 2019-08-04 DIAGNOSIS — F418 Other specified anxiety disorders: Secondary | ICD-10-CM | POA: Insufficient documentation

## 2019-08-04 NOTE — Patient Instructions (Signed)
Coronavirus (COVID-19) Are you at risk?  Are you at risk for the Coronavirus (COVID-19)?  To be considered HIGH RISK for Coronavirus (COVID-19), you have to meet the following criteria:  . Traveled to [place], [place], [name], [place] or [place]; or in the [place] to [place], [place], [place], or [place]; and have fever, cough, and shortness of breath within the last 2 weeks of travel OR . Been in close contact with a person diagnosed with COVID-19 within the last 2 weeks and have fever, cough, and shortness of breath . IF YOU DO NOT MEET THESE CRITERIA, YOU ARE CONSIDERED LOW RISK FOR COVID-19.  What to do if you are HIGH RISK for COVID-19?  . If you are having a medical emergency, call 911. . Seek medical care right away. Before you go to a doctor's office, urgent care or emergency department, call ahead and tell them about your recent travel, contact with someone diagnosed with COVID-19, and your symptoms. You should receive instructions from your physician's office regarding next steps of care.  . When you arrive at healthcare provider, tell the healthcare staff immediately you have returned from visiting [place], [place], [place], [place] or [name]; or traveled in the [place] to [place], [place], [place], or [place]; in the last two weeks or you have been in close contact with a person diagnosed with COVID-19 in the last 2 weeks.   . Tell the health care staff about your symptoms: fever, cough and shortness of breath. . After you have been seen by a medical provider, you will be either: o Tested for (COVID-19) and discharged home on quarantine except to seek medical care if symptoms worsen, and asked to  - Stay home and avoid contact with others until you get your results (4-5 days)  - Avoid travel on public transportation if possible (such as bus, train, or airplane) or o Sent to the Emergency Department by EMS for evaluation, COVID-19 testing, and possible  admission depending on your condition and test results.  What to do if you are LOW RISK for COVID-19?  Reduce your risk of any infection by using the same precautions used for avoiding the common cold or flu:  . Wash your hands often with soap and warm water for at least 20 seconds.  If soap and water are not readily available, use an alcohol-based hand sanitizer with at least 60% alcohol.  . If coughing or sneezing, cover your mouth and nose by coughing or sneezing into the elbow areas of your shirt or coat, into a tissue or into your sleeve (not your hands). . Avoid shaking hands with others and consider head nods or verbal greetings only. . Avoid touching your eyes, nose, or mouth with unwashed hands.  . Avoid close contact with people who are sick. . Avoid places or events with large numbers of people in one location, like concerts or sporting events. . Carefully consider travel plans you have or are making. . If you are planning any travel outside or inside the [place], visit the CDC's Travelers' Health webpage for the latest health notices. . If you have some symptoms but not all symptoms, continue to monitor at home and seek medical attention if your symptoms worsen. . If you are having a medical emergency, call 911.   ADDITIONAL HEALTHCARE OPTIONS FOR PATIENTS  [facility] / e-Visit: [email or web address]         [facility]: [phone]  [facility]: [phone]                   [facility]: [phone]   

## 2019-08-04 NOTE — Progress Notes (Signed)
GYN Location of Tumor / Histology: stage I vaginal squamous cell carcinoma.  Jordan [name] presented with symptoms of: The patient's history began with a high-grade Pap smear in 2017 that resulted in cervical conization that revealed CIN-3.  A follow-up Pap smear in 2018 showed H SIL.  She then underwent a vaginal hysterectomy which confirmed residual CIN-3 with negative margins at the hysterectomy specimen.  She underwent a Pap test for surveillance in April 21, 2019 which revealed the AIN 2-3, H SIL.  Subsequently a vaginal colposcopy was performed on May 28, 2019.  No visible lesions were seen.  There was minimal acetowhite changes noted after acetic acid was applied that was not noted which location.  Biopsies were obtained from the vagina.  Final pathology revealed poorly differentiated squamous cell carcinoma.  The tumor cells were positive for CK 5 6, p63, and P 16.  The patient has a history of bipolar disorder.  She has been treated with medical therapies for this.  She has a history of obesity with a BMI of 41 kg/m.  The patient is a chronic tobacco user.  She has a history of PICC line infection with sepsis due to gram-negative bacteria.  Biopsies revealed:  SURGICAL PATHOLOGY   THIS IS AN ADDENDUM REPORT  CASE: [id number]  PATIENT: Jordan [name]  Surgical Pathology Report  Addendum   Reason for Addendum #1: Additional clinical/test information   Clinical History: vaginal cancer   DIAGNOSIS:   A. LYMPH NODE, RIGHT OBTURATOR SENTINEL, BIOPSY:  - There is no evidence of carcinoma in 1 of 1 lymph node (0/1).  - See comment.   B. LYMPH NODE, RIGHT PRESACRAL SENTINEL, BIOPSY:  There is no evidence of carcinoma in 1 of 1 lymph node (0/1).  - See comment.   C. LYMPH NODE, LEFT EXTERNAL ILIAC SENTINEL, BIOPSY:  - There is no evidence of carcinoma in 1 of 1 lymph node (0/1).  - See comment.   D. FALLOPIAN TUBE AND OVARY, RIGHT, SALPINGO-OOPHORECTOMY:  - Benign ovary and  fallopian tube.   E. FALLOPIAN TUBE, LEFT, SALPINGECTOMY:  - Benign fallopian tube.   F. VAGINA, UPPER WITH BILATERAL PARAMETRIUM, VAGINECTOMY:  - Invasive squamous cell carcinoma, poorly differentiated, spanning 0.6  cm.  - Carcinomais broadly present at the posterior surgical margin.  ADDENDUM:  The depth of invasion is at least 2.5 mm.  Past/Anticipated interventions by Gyn/Onc surgery, if any: 07/22/19: Operation: Robotic-assisted radical vaginectomy with bilateral pelvic SLN biopsy, right salpingo-oophorectomy, left salpingectomy.   Surgeon: Donaciano Eva  Past/Anticipated interventions by medical oncology, if any: None at this time.  Weight changes, if any:  Wt Readings from Last 3 Encounters:  08/04/19 282 lb 6 oz (128.1 kg)  07/30/19 284 lb 6 oz (129 kg)  07/19/19 284 lb 6 oz (129 kg)     Bowel/Bladder complaints, if any: Pt denies dysuria/hematuria. Pt denies vaginal bleeding/discharge. Pt denies rectal bleeding, diarrhea/constipation.   Nausea/Vomiting, if any: Pt denies abdominal bloating, N/V.  Pain issues, if any:  Pt reports pain in lower abdominal/pelvis area, rated 8/10. Pt reports [name] states it is from healing and can take several weeks.  SAFETY ISSUES:  Prior radiation? No  Pacemaker/ICD? No  Possible current pregnancy? No  Is the patient on methotrexate? No  Current Complaints / other details:  Pt presents today for initial consult with Dr. Sondra Come for Radiation Oncology.  BP 129/84 (BP Location: Left Arm, Patient Position: Sitting)   Pulse 81   Temp 98.2 F (  36.8 C) (Temporal)   Resp 20   Ht 5\' 8"  (1.727 m)   Wt 282 lb 6 oz (128.1 kg)   LMP  (LMP Unknown)   SpO2 99%   BMI 42.93 kg/m  Jordan Sousa, RN BSN

## 2019-08-04 NOTE — Progress Notes (Signed)
Radiation Oncology         [phone] ________________________________  Initial Outpatient Consultation  Name: Jordan [name] MRN: [id number]  Date: 08/04/2019  DOB: 1966/10/07  [id number], Jordan Schneiders, NP  Everitt Amber, MD   REFERRING PHYSICIAN: Everitt Amber, MD  DIAGNOSIS: The encounter diagnosis was Vaginal cancer [facility]).  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 female who is accompanied by her boyfriend via telephone. The patient has a history of cervical intraepithelial neoplasia in 2017 that prompted a vaginal hysterectomy in 02/2017. Pathology from the procedure showed residual CIN-3 with negative margins.   She presented to her OBGYN for routine exam on 04/21/2019. Pap smear performed that day revealed HPV 16 and high grade squamous intraepithelial lesion.  The patient underwent a biopsy on 05/28/2019 showing: poorly differentiated squamous cell carcinoma, tumor appears to extend to the tissue edge.  She was referred to [name] on 06/11/2019, who ordered PET scan and recommended upper vaginectomy versus pelvic radiation therapy with vaginal brachytherapy if disease is confined to the vagina.  Peer to peer performed on 06/14/2019 resulted in request for standard imaging prior to PET scan. Abdomen/pelvic CT and pelvis MRI were ordered. Performed on 06/18/2019, abdomen/pelvic CT showed: no evidence of metastatic disease.   Performed on 06/26/2019, pelvis MRI showed: susceptibility artifact from vaginal cuff suture material limits evaluation, however no focal soft tissue mass is seen involving the vaginal cuff or parametrial soft tissues; no evidence of lymphadenopathy or other pelvic metastatic disease.  The patient opted to proceed with radical upper vaginectomy with sentinel lymph node biopsy, right SO and left salpingectomy on 07/22/2019. Pathology from the procedure revealed: invasive squamous cell carcinoma, poorly  differentiated, spanning 0.6 cm; carcinoma broadly present at posterior surgical margin; negative lymph nodes (0/3).  In light of the positive margin, she has been kindly referred to me for consideration of adjuvant radiation to the vaginal cuff to consolidate margin status.  PREVIOUS RADIATION THERAPY: No  PAST MEDICAL HISTORY:  Past Medical History:  Diagnosis Date  . Alcohol abuse   . Anemia    patient denies  . Bipolar 1 disorder (HCC)    No medications currently  . CAP (community acquired pneumonia) 03/17/2015  . 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 Mental disorder   . Obesity   . PICC line infection 05/17/2015  . Sepsis due to Gram negative bacteria (MDR E Coli) 02/18/2015  . 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[place])   . UTI (lower urinary tract infection)   . Vaginal Pap smear, abnormal     PAST SURGICAL HISTORY: Past Surgical History:  Procedure Laterality Date  . ABDOMINAL HYSTERECTOMY    . CERVICAL CONIZATION W/BX N/A 07/01/2016   Procedure: CONIZATION CERVIX WITH BIOPSY;  Surgeon: Chancy Milroy, MD;  Location: [facility];  Service: Gynecology;  Laterality: N/A;  . CHOLECYSTECTOMY    . HYSTEROSCOPY W/D&C N/A 01/25/2016   Procedure: DILATATION AND CURETTAGE /HYSTEROSCOPY;  Surgeon: Mora Bellman, MD;  Location: [place] [facility];  Service: Gynecology;  Laterality: N/A;  . LYMPH NODE BIOPSY Bilateral 07/22/2019   Procedure: LYMPH NODE BIOPSY;  Surgeon: Everitt Amber, MD;  Location: [facility];  Service: Gynecology;  Laterality: Bilateral;  . ROBOT ASSISTED MYOMECTOMY N/A 07/22/2019   Procedure: XI ROBOTIC ASSISTED LAPAROSCOPIC RADICAL UPPER VAGINECTOMY, LEFT SALPINECTOMY, RIGHT SALPINGOOOPHERECTOMY;  Surgeon: Everitt Amber, MD;  Location: [facility];  Service: Gynecology;  Laterality: N/A;  .  VAGINAL HYSTERECTOMY N/A 03/11/2017   Procedure: HYSTERECTOMY VAGINAL WITH MORCELLATION;  Surgeon: Chancy Milroy, MD;  Location: [facility];  Service: Gynecology;  Laterality: N/A;     FAMILY HISTORY:  Family History  Problem Relation Age of Onset  . Alcohol abuse Father   . Cancer Father   . Breast cancer Neg Hx     SOCIAL HISTORY:  Social History   Tobacco Use  . Smoking status: Never Smoker  . Smokeless tobacco: Never Used  Substance Use Topics  . Alcohol use: No  . Drug use: No    ALLERGIES:  Allergies  Allergen Reactions  . Ciprofloxacin Swelling    Lips swell, tongue swells, face swells  . Citalopram Other (See Comments)    Possible cause of pancytopenia. Swelling of tongue, face and throat  . Lamotrigine Other (See Comments)    Possible cause of pancytopenia. Swelling of face, throat and tongue  . Sulfa Antibiotics Anaphylaxis  . Tramadol Anaphylaxis, Shortness Of Breath and Swelling    MEDICATIONS:  Current Outpatient Medications  Medication Sig Dispense Refill  . acetaminophen (TYLENOL) 500 MG tablet Take 1-2 tablets (500-1,000 mg total) by mouth every 6 (six) hours as needed for moderate pain. 30 tablet 1  . cephALEXin (KEFLEX) 500 MG capsule Take 1 capsule (500 mg total) by mouth 4 (four) times daily. 20 capsule 0  . ibuprofen (ADVIL) 800 MG tablet Take 1 tablet (800 mg total) by mouth every 8 (eight) hours as needed. 30 tablet 1  . lurasidone (LATUDA) 20 MG TABS tablet Take 20 mg by mouth daily.    Marland Kitchen oxyCODONE (OXY IR/ROXICODONE) 5 MG immediate release tablet Take 1 tablet (5 mg total) by mouth every 4 (four) hours as needed for severe pain or breakthrough pain. 10 tablet 0  . Oxycodone HCl 10 MG TABS Take 10 mg by mouth 2 (two) times daily as needed (pain).     Marland Kitchen senna-docusate (SENOKOT-S) 8.6-50 MG tablet Take 2 tablets by mouth at bedtime. 30 tablet 0  . Vitamin D, Ergocalciferol, (DRISDOL) 1.25 MG (50000 UT) CAPS capsule Take 50,000 Units by mouth once a week.     No current facility-administered medications for this encounter.     REVIEW OF SYSTEMS:  A 10+ POINT REVIEW OF SYSTEMS WAS OBTAINED including neurology, dermatology,  psychiatry, cardiac, respiratory, lymph, extremities, GI, GU, musculoskeletal, constitutional, reproductive, HEENT. She denies dysuria or hematuria, vaginal discharge or bleeding, rectal bleeding, diarrhea or constipation, abdominal bloating, and nausea or vomiting. She reports pain to her lower abdomen/pelvis area, rated 8/10. She notes she was told by [name] that it is from healing and can last several weeks.   PHYSICAL EXAM:  height is 5\' 8"  (1.727 m) and weight is 282 lb 6 oz (128.1 kg). Her temporal temperature is 98.2 F (36.8 C). Her blood pressure is 129/84 and her pulse is 81. Her respiration is 20 and oxygen saturation is 99%.   General: Alert and oriented, in no acute distress HEENT: Head is normocephalic. Extraocular movements are intact. Oropharynx is clear. Neck: Neck is supple, no palpable cervical or supraclavicular lymphadenopathy. Heart: Regular in rate and rhythm with no murmurs, rubs, or gallops. Chest: Clear to auscultation bilaterally, with no rhonchi, wheezes, or rales. Abdomen: Soft, nontender, nondistended, with no rigidity or guarding. Extremities: No cyanosis or edema. Lymphatics: see Neck Exam Skin: No concerning lesions. Musculoskeletal: symmetric strength and muscle tone throughout. Neurologic: Cranial nerves II through XII are grossly intact. No obvious focalities. Speech is  fluent. Coordination is intact. Psychiatric: Judgment and insight are intact. Affect is appropriate. Pelvic exam deferred in light of recent surgery.   ECOG = 1  0 - Asymptomatic (Fully active, able to carry on all predisease activities without restriction)  1 - Symptomatic but completely ambulatory (Restricted in physically strenuous activity but ambulatory and able to carry out work of a light or sedentary nature. For example, light housework, office work)  2 - Symptomatic, <50% in bed during the day (Ambulatory and capable of all self care but unable to carry out any work activities.  Up and about more than 50% of waking hours)  3 - Symptomatic, >50% in bed, but not bedbound (Capable of only limited self-care, confined to bed or chair 50% or more of waking hours)  4 - Bedbound (Completely disabled. Cannot carry on any self-care. Totally confined to bed or chair)  5 - Death   Eustace Pen MM, Creech RH, Tormey DC, et al. [phone]). "Toxicity and response criteria of the [facility]". [place] Oncol. 5 (6): 649-55  LABORATORY DATA:  Lab Results  Component Value Date   WBC 6.7 07/23/2019   HGB 13.0 07/23/2019   HCT 40.0 07/23/2019   MCV 94.3 07/23/2019   PLT 158 07/23/2019   NEUTROABS 2.9 01/17/2019   Lab Results  Component Value Date   NA 138 07/23/2019   K 4.4 07/23/2019   CL 107 07/23/2019   CO2 23 07/23/2019   GLUCOSE 113 (H) 07/23/2019   CREATININE 0.79 07/23/2019   CALCIUM 8.8 (L) 07/23/2019      RADIOGRAPHY: No results found.    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 I would therefore recommend vaginal brachytherapy as part of her postoperative treatment. Since the patient's lymph node evaluation showed no metastatic spread, I do not feel there is much benefit to adding external beam radiation therapy.   Today, I talked to the patient about the findings and work-up thus far.  We discussed the natural history of vaginal cancer and general treatment, highlighting the role of radiotherapy in the management.  We discussed the available radiation techniques, and focused on the details of logistics and delivery.  We reviewed the anticipated acute and late sequelae associated with radiation in this setting.  The patient was encouraged to ask questions that I answered to the best of my ability.  A patient consent form was discussed and signed.  We retained a copy for our records.  The patient  would like to proceed with radiation and will be scheduled for CT simulation.  PLAN: Patient will proceed with her vaginal brachytherapy approximately 6 weeks postop, assuming she has had adequate healing. Anticipate 5 brachytherapy procedures using iridium 192 is the high-dose-rate source. Patient will be meeting with [name] on 08/10/2019 for pelvic examination.      ------------------------------------------------  Blair Promise, PhD, MD  This document serves as a record of services personally performed by Gery Pray, MD. It was created on his behalf by Wilburn Mylar, a trained medical scribe. The creation of this record is based on the scribe's personal observations and the provider's statements to them. This document has been checked and approved by the attending provider.

## 2019-08-06 ENCOUNTER — Telehealth: Payer: Self-pay | Admitting: *Deleted

## 2019-08-06 NOTE — Telephone Encounter (Signed)
Called patient to inform of New [facility]DR [facility], spoke with patient and she is aware of these appts. 

## 2019-08-10 ENCOUNTER — Inpatient Hospital Stay (HOSPITAL_BASED_OUTPATIENT_CLINIC_OR_DEPARTMENT_OTHER): Payer: Medicaid Other | Admitting: Gynecologic Oncology

## 2019-08-10 ENCOUNTER — Encounter: Payer: Self-pay | Admitting: Gynecologic Oncology

## 2019-08-10 ENCOUNTER — Encounter: Payer: Self-pay | Admitting: Oncology

## 2019-08-10 ENCOUNTER — Other Ambulatory Visit: Payer: Self-pay

## 2019-08-10 VITALS — BP 124/80 | HR 82 | Temp 98.3°F | Resp 16 | Ht 68.0 in | Wt 280.0 lb

## 2019-08-10 DIAGNOSIS — C52 Malignant neoplasm of vagina: Secondary | ICD-10-CM | POA: Diagnosis not present

## 2019-08-10 MED ORDER — IBUPROFEN 800 MG PO TABS: 800.0000 mg | ORAL_TABLET | Freq: Three times a day (TID) | ORAL | 4 refills | Status: DC | PRN

## 2019-08-10 NOTE — Progress Notes (Signed)
  Oncology Nurse Navigator Documentation  Navigator Location: [facility] (08/10/19 1534)   )Navigator Encounter Type: Follow-up Appt (08/10/19 1534)                        Barriers/Navigation Needs: Coordination of Care;Education;Health Literacy (08/10/19 1534) Education: Preparing for Upcoming Surgery/ Treatment (08/10/19 1534) Interventions: Coordination of Care;Education (08/10/19 1534)   Coordination of Care: Appts (08/10/19 1534)        Acuity: Level 3-Moderate Needs (3-4 Barriers Identified) (08/10/19 1534)         Time Spent with Patient: 45 (08/10/19 1534)

## 2019-08-10 NOTE — Patient Instructions (Signed)
Dr Denman George is recommending radiation with Dr Sondra Come.  You are cleared to start this next week.  Dr Denman George has refilled your ibuprofen prescription. Do not take more than 2400mg  of ibuprofen in a 24 hour period.  Place nothing in the vagina for another 2 months (other than radiation treatments).   Dr Denman George will see you when you return from your radiation treatments.

## 2019-08-10 NOTE — Progress Notes (Signed)
Follow-up Note: Gyn-Onc  Consult was requested by Dr. Rip Harbour for the evaluation of Jordan [name] 53 y.o. female  CC:  Chief Complaint  Patient presents with  . Vaginal cancer [facility])    Post Op visit    Assessment/Plan:  Jordan [name]  is a 53 y.o.  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  Positive margins on specimen. Recommend vaginal brachytherapy.   She will return to see me after completing therapy for surveillance.   She is healing well and appears comfortable.   HPI: Jordan [name] is a 53 year old P1 who is seen in consultation at the request of Dr Rip Harbour for squamous cell carcinoma of the vulva.  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  A follow-up Pap smear in 2018 showed H SIL.  She then underwent a vaginal hysterectomy which confirmed residual CIN-3 with negative margins at the hysterectomy specimen.  She underwent a Pap test for surveillance in April 21, 2019 which revealed the AIN 2-3, H SIL.  Subsequently a vaginal colposcopy was performed on May 28, 2019.  No visible lesions were seen.  There was minimal acetowhite changes noted after acetic acid was applied that was not noted which location.  Biopsies were obtained from the vagina.  Final pathology revealed poorly differentiated squamous cell carcinoma.  The tumor cells were positive for CK 5 6, p63, and P 16.  The patient has a history of bipolar disorder.  She has been treated with medical therapies for this.  She has a history of obesity with a BMI of 41 kg/m.  The patient is a chronic tobacco user.  She has a history of PICC line infection with sepsis due to gram-negative bacteria.  She has had one prior vaginal delivery.  She is currently not employed.   On June 18, 2019 she underwent staging CT scan of the chest abdomen and pelvis.  This revealed that she was status post hysterectomy  with no evidence of metastatic disease in the abdomen or pelvis or chest.  There was a 6 mm right lower lobe that was grossly unchanged from a 2017 CT scan felt to be benign.  A compression fracture was seen at L2 unchanged from prior radiographs.  An MRI of the pelvis with IV contrast was performed on June 26, 2019.  It revealed susceptibility artifact in the vaginal cuff from suture material limiting evaluation.  However no focal soft tissue masses were seen involving the vaginal cuff and no abnormal soft tissue densities in the parametrial regions.  The right and left ovaries were grossly normal.   Interval Hx:  On July 22, 2019 she underwent a robotic assisted radical upper vaginectomy with bilateral pelvic sentinel lymph node biopsy, right salpingo-oophorectomy, left salpingectomy.  Intraoperative findings were significant for no grossly visible vaginal lesions.  The ovaries and fallopian tubes were adherent to the rectum and vaginal cuff in the obliterated cul-de-sac.  The ovaries themselves are grossly normal-appearing but densely adherent with no suspicious lymph nodes.  There is clinical stage I disease, microscopic.  Postoperatively final pathology returned as FIGO stage Ia microscopic poorly differentiated squamous cell carcinoma.  The tumor site was the posterior vaginal wall and measured 0.6 cm.  The posterior margin (the posterior vaginal cuff margin) was broadly positive for tumor cells.  The deep margin was negative.  There was 2.5 mm depth of invasion.  All sentinel lymph nodes were negative for metastases, as was the left fallopian tube and right tube and ovary.  Due to positive margin status she was recommended adjuvant vaginal radiation postoperatively.   Current Meds:  Outpatient Encounter Medications as of 08/10/2019  Medication Sig  . acetaminophen (TYLENOL) 500 MG tablet Take 1-2 tablets (500-1,000 mg total) by mouth every 6 (six) hours as needed for moderate pain.  .  cephALEXin (KEFLEX) 500 MG capsule Take 1 capsule (500 mg total) by mouth 4 (four) times daily.  Marland Kitchen ibuprofen (ADVIL) 800 MG tablet Take 1 tablet (800 mg total) by mouth every 8 (eight) hours as needed.  . lurasidone (LATUDA) 20 MG TABS tablet Take 20 mg by mouth daily.  Marland Kitchen oxyCODONE (OXY IR/ROXICODONE) 5 MG immediate release tablet Take 1 tablet (5 mg total) by mouth every 4 (four) hours as needed for severe pain or breakthrough pain.  . Oxycodone HCl 10 MG TABS Take 10 mg by mouth 2 (two) times daily as needed (pain).   Marland Kitchen senna-docusate (SENOKOT-S) 8.6-50 MG tablet Take 2 tablets by mouth at bedtime.  . Vitamin D, Ergocalciferol, (DRISDOL) 1.25 MG (50000 UT) CAPS capsule Take 50,000 Units by mouth once a week.  . [DISCONTINUED] ibuprofen (ADVIL) 800 MG tablet Take 1 tablet (800 mg total) by mouth every 8 (eight) hours as needed.   No facility-administered encounter medications on file as of 08/10/2019.     Allergy:  Allergies  Allergen Reactions  . Ciprofloxacin Swelling    Lips swell, tongue swells, face swells  . Citalopram Other (See Comments)    Possible cause of pancytopenia. Swelling of tongue, face and throat  . Lamotrigine Other (See Comments)    Possible cause of pancytopenia. Swelling of face, throat and tongue  . Sulfa Antibiotics Anaphylaxis  . Tramadol Anaphylaxis, Shortness Of Breath and Swelling    Social Hx:   Social History   Socioeconomic History  . Marital status: Soil scientist    Spouse name: Not on file  . Number of children: Not on file  . Years of education: Not on file  . Highest education level: Not on file  Occupational History  . Not on file  Social Needs  . Financial resource strain: Not on file  . Food insecurity    Worry: Not on file    Inability: Not on file  . Transportation needs    Medical: Not on file    Non-medical: Not on file  Tobacco Use  . Smoking status: Never Smoker  . Smokeless tobacco: Never Used  Substance and Sexual  Activity  . Alcohol use: No  . Drug use: No  . Sexual activity: Yes    Birth control/protection: Post-menopausal, Surgical    Comment: perimenopausal; no sex in years  Lifestyle  . Physical activity    Days per week: Not on file    Minutes per session: Not on file  . Stress: Not on file  Relationships  . Social Herbalist on phone: Not on file    Gets together: Not on file    Attends religious service: Not on file    Active member of club or organization: Not on file    Attends meetings of clubs or organizations: Not on file    Relationship status: Not on file  . Intimate partner violence    Fear of current or ex partner: Not on file    Emotionally abused: Not on file    Physically abused: Not  on file    Forced sexual activity: Not on file  Other Topics Concern  . Not on file  Social History Narrative  . Not on file    Past Surgical Hx:  Past Surgical History:  Procedure Laterality Date  . ABDOMINAL HYSTERECTOMY    . CERVICAL CONIZATION W/BX N/A 07/01/2016   Procedure: CONIZATION CERVIX WITH BIOPSY;  Surgeon: Chancy Milroy, MD;  Location: [facility];  Service: Gynecology;  Laterality: N/A;  . CHOLECYSTECTOMY    . HYSTEROSCOPY W/D&C N/A 01/25/2016   Procedure: DILATATION AND CURETTAGE /HYSTEROSCOPY;  Surgeon: Mora Bellman, MD;  Location: [facility];  Service: Gynecology;  Laterality: N/A;  . LYMPH NODE BIOPSY Bilateral 07/22/2019   Procedure: LYMPH NODE BIOPSY;  Surgeon: Everitt Amber, MD;  Location: [facility];  Service: Gynecology;  Laterality: Bilateral;  . ROBOT ASSISTED MYOMECTOMY N/A 07/22/2019   Procedure: XI ROBOTIC ASSISTED LAPAROSCOPIC RADICAL UPPER VAGINECTOMY, LEFT SALPINECTOMY, RIGHT SALPINGOOOPHERECTOMY;  Surgeon: Everitt Amber, MD;  Location: [facility];  Service: Gynecology;  Laterality: N/A;  . VAGINAL HYSTERECTOMY N/A 03/11/2017   Procedure: HYSTERECTOMY VAGINAL WITH MORCELLATION;  Surgeon: Chancy Milroy, MD;  Location: [place] [facility];  Service: Gynecology;  Laterality:  N/A;    Past Medical Hx:  Past Medical History:  Diagnosis Date  . Alcohol abuse   . Anemia    patient denies  . Bipolar 1 disorder (HCC)    No medications currently  . CAP (community acquired pneumonia) 03/17/2015  . 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 Mental disorder   . Obesity   . PICC line infection 05/17/2015  . Sepsis due to Gram negative bacteria (MDR E Coli) 02/18/2015  . 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[place])   . UTI (lower urinary tract infection)   . Vaginal Pap smear, abnormal     Past Gynecological History:  See HPI No LMP recorded (lmp unknown). Patient has had a hysterectomy.  Family Hx:  Family History  Problem Relation Age of Onset  . Alcohol abuse Father   . Cancer Father   . Breast cancer Neg Hx     Review of Systems:  Constitutional  Feels well,    ENT Normal appearing ears and nares bilaterally Skin/Breast  No rash, sores, jaundice, itching, dryness Cardiovascular  No chest pain, shortness of breath, or edema  Pulmonary  No cough or wheeze.  Gastro Intestinal  No nausea, vomitting, or diarrhoea. No bright red blood per rectum, no abdominal pain, change in bowel movement, or constipation.  Genito Urinary  No frequency, urgency, dysuria, or discharge. Not sexually active. + vaginal spotting.  Musculo Skeletal  No myalgia, arthralgia, joint swelling or pain  Neurologic  No weakness, numbness, change in gait,  Psychology  No depression, anxiety, insomnia.   Vitals:  Blood pressure 124/80, pulse 82, temperature 98.3 F (36.8 C), temperature source Temporal, resp. rate 16, height 5\' 8"  (1.727 m), weight 280 lb (127 kg).  Physical Exam: WD in NAD Neck  Supple NROM, without any enlargements.  Lymph Node Survey No cervical supraclavicular or inguinal adenopathy Cardiovascular  Pulse normal rate, regularity and rhythm. S1 and S2 normal.  Lungs  Clear to auscultation bilateraly, without wheezes/crackles/rhonchi. Good  air movement.  Skin  No rash/lesions/breakdown  Psychiatry  Alert and oriented to person, place, and time  Abdomen  Normoactive bowel sounds, abdomen soft, non-tender and obese without evidence of hernia. Well healed abdominal incisions.  Back No CVA tenderness Genito Urinary  Vulva/vagina: Normal external female genitalia.  No lesions. No discharge or bleeding.  Bladder/urethra:  No lesions or masses, well supported bladder  Vagina: vaginal cuff in tact, no lesions, no visible disease. Suture material present Rectal  deferred Extremities  No bilateral cyanosis, clubbing or edema.   Thereasa Solo, MD  08/10/2019, 3:11 PM

## 2019-08-11 ENCOUNTER — Telehealth: Payer: Self-pay | Admitting: *Deleted

## 2019-08-11 NOTE — Telephone Encounter (Signed)
RETURNED PATIENT'S PHONE CALL, LVM FOR A RETURN CALL 

## 2019-08-11 NOTE — Telephone Encounter (Signed)
Called patient to inform of New [facility] and cases being moved from to Nov. 19 to Nov. 3, spoke with patient and she is aware of these dates and times.

## 2019-08-23 ENCOUNTER — Telehealth: Payer: Self-pay | Admitting: *Deleted

## 2019-08-23 NOTE — Telephone Encounter (Signed)
Called patient to remind of HDR [place] for 08-24-19, spoke with patient and she is aware of these appts.

## 2019-08-24 ENCOUNTER — Ambulatory Visit
Admission: RE | Admit: 2019-08-24 | Discharge: 2019-08-24 | Disposition: A | Discharge status: Home / Self Care | Payer: Medicaid Other | Source: Ambulatory Visit | Attending: Radiation Oncology | Admitting: Radiation Oncology

## 2019-08-24 ENCOUNTER — Encounter: Payer: Self-pay | Admitting: Radiation Oncology

## 2019-08-24 ENCOUNTER — Other Ambulatory Visit: Payer: Self-pay

## 2019-08-24 VITALS — BP 108/87 | HR 78 | Temp 98.3°F | Resp 18 | Ht 68.0 in | Wt 280.5 lb

## 2019-08-24 DIAGNOSIS — C52 Malignant neoplasm of vagina: Secondary | ICD-10-CM

## 2019-08-24 DIAGNOSIS — Z79899 Other long term (current) drug therapy: Secondary | ICD-10-CM | POA: Diagnosis not present

## 2019-08-24 NOTE — Progress Notes (Signed)
  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 She will be treated with a 2.5 cm diameter segmented cylinder. This conforms to her anatomy without undue discomfort.  Vaginal brachytherapy procedure node: The patient was brought to the [place] suite. Identity was confirmed. All relevant records and images related to the planned course of therapy were reviewed. The patient freely provided informed written consent to proceed with treatment after reviewing the details related to the planned course of therapy. The consent form was witnessed and verified by the simulation staff. Then, the patient was set-up in a stable reproducible supine position for radiation therapy. Pelvic exam revealed the vaginal vault was noted to be intact. The patient's custom vaginal cylinder was placed in the proximal vagina. This was affixed to the CT/MR stabilization plate to prevent slippage. Patient tolerated the placement well.  Verification simulation note:  A fiducial marker was placed within the vaginal cylinder. An AP and lateral film was then obtained through the pelvis area. This documented accurate position of the vaginal cylinder for treatment.  HDR BRACHYTHERAPY TREATMENT  The remote afterloading device was affixed to the vaginal cylinder by catheter. Patient then proceeded to undergo her first high-dose-rate treatment directed at the proximal vagina. The patient was prescribed a dose of 6.0 gray to be delivered to the mucosal surface. Treatment length was 4.0 cm. Patient was treated with 1 channel using 9 dwell positions. Treatment time was 306.1 seconds. Iridium 192 was the  high-dose-rate source for treatment. The patient tolerated the treatment well. After completion of her therapy, a radiation survey was performed documenting return of the iridium source into the GammaMed safe.   PLAN: Patient will return next week for second high-dose-rate treatment ________________________________  Blair Promise, PhD, MD

## 2019-08-24 NOTE — Progress Notes (Signed)
  Radiation Oncology         [phone] ________________________________  Name: Bailie Courtney MRN: [id number]  Date: 08/24/2019  DOB: May 08, 1966  Vaginal Brachytherapy Procedure Note  CC: [name], NP [name], NP    ICD-10-CM   1. 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  The patient was recently examined by [name] and was felt to be ready to proceed with postoperative treatments given the positive posterior surgical margin.  ALLERGIES: is allergic to ciprofloxacin; citalopram; lamotrigine; sulfa antibiotics; and tramadol.  Meds: Current Outpatient Medications  Medication Sig Dispense Refill  . acetaminophen (TYLENOL) 500 MG tablet Take 1-2 tablets (500-1,000 mg total) by mouth every 6 (six) hours as needed for moderate pain. 30 tablet 1  . ibuprofen (ADVIL) 800 MG tablet Take 1 tablet (800 mg total) by mouth every 8 (eight) hours as needed. 30 tablet 4  . lurasidone (LATUDA) 20 MG TABS tablet Take 20 mg by mouth daily.    Marland Kitchen oxyCODONE (OXY IR/ROXICODONE) 5 MG immediate release tablet Take 1 tablet (5 mg total) by mouth every 4 (four) hours as needed for severe pain or breakthrough pain. 10 tablet 0  . Oxycodone HCl 10 MG TABS Take 10 mg by mouth 2 (two) times daily as needed (pain).     Marland Kitchen senna-docusate (SENOKOT-S) 8.6-50 MG tablet Take 2 tablets by mouth at bedtime. 30 tablet 0  . Vitamin D, Ergocalciferol, (DRISDOL) 1.25 MG (50000 UT) CAPS capsule Take 50,000 Units by mouth once a week.     No current facility-administered medications for this encounter.     Physical Findings: The patient is in no acute distress. Patient is alert and oriented.  height is 5\' 8"  (1.727 m) and weight is 280 lb 8 oz (127.2 kg). Her temporal temperature is 98.3 F (36.8 C). Her blood pressure is 108/87 and her pulse is 78. Her respiration is 18 and oxygen  saturation is 98%.   No palpable cervical, supraclavicular or axillary lymphoadenopathy. The heart has a regular rate and rhythm. The lungs are clear to auscultation. Abdomen soft and non-tender.  On pelvic examination the external genitalia were unremarkable. A speculum exam was performed.  Sutures were noted in the upper vaginal area.  Slight bleeding with examination.  No defects noted in the vaginal wall.  No pelvic masses appreciated on vaginal exam  Lab Findings: Lab Results  Component Value Date   WBC 6.7 07/23/2019   HGB 13.0 07/23/2019   HCT 40.0 07/23/2019   MCV 94.3 07/23/2019   PLT 158 07/23/2019    Radiographic Findings: No results found.  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  Patient was fitted for a vaginal cylinder. The patient will be treated with a 2.5 cm diameter cylinder with a treatment length of 4 cm. This distended the vaginal vault without undue discomfort. The patient tolerated the procedure well.  The patient was successfully fitted for a vaginal cylinder. The patient is appropriate to begin vaginal brachytherapy.   Plan: The patient will proceed with CT simulation and vaginal brachytherapy today.    _______________________________   Blair Promise, PhD, MD

## 2019-08-24 NOTE — Patient Instructions (Signed)
Coronavirus (COVID-19) Are you at risk?  Are you at risk for the Coronavirus (COVID-19)?  To be considered HIGH RISK for Coronavirus (COVID-19), you have to meet the following criteria:  . Traveled to [place], [place], [name], [place] or [place]; or in the [place] to [place], [place], [place], or [place]; and have fever, cough, and shortness of breath within the last 2 weeks of travel OR . Been in close contact with a person diagnosed with COVID-19 within the last 2 weeks and have fever, cough, and shortness of breath . IF YOU DO NOT MEET THESE CRITERIA, YOU ARE CONSIDERED LOW RISK FOR COVID-19.  What to do if you are HIGH RISK for COVID-19?  . If you are having a medical emergency, call 911. . Seek medical care right away. Before you go to a doctor's office, urgent care or emergency department, call ahead and tell them about your recent travel, contact with someone diagnosed with COVID-19, and your symptoms. You should receive instructions from your physician's office regarding next steps of care.  . When you arrive at healthcare provider, tell the healthcare staff immediately you have returned from visiting [place], [place], [place], [place] or [name]; or traveled in the [place] to [place], [place], [place], or [place]; in the last two weeks or you have been in close contact with a person diagnosed with COVID-19 in the last 2 weeks.   . Tell the health care staff about your symptoms: fever, cough and shortness of breath. . After you have been seen by a medical provider, you will be either: o Tested for (COVID-19) and discharged home on quarantine except to seek medical care if symptoms worsen, and asked to  - Stay home and avoid contact with others until you get your results (4-5 days)  - Avoid travel on public transportation if possible (such as bus, train, or airplane) or o Sent to the Emergency Department by EMS for evaluation, COVID-19 testing, and possible  admission depending on your condition and test results.  What to do if you are LOW RISK for COVID-19?  Reduce your risk of any infection by using the same precautions used for avoiding the common cold or flu:  . Wash your hands often with soap and warm water for at least 20 seconds.  If soap and water are not readily available, use an alcohol-based hand sanitizer with at least 60% alcohol.  . If coughing or sneezing, cover your mouth and nose by coughing or sneezing into the elbow areas of your shirt or coat, into a tissue or into your sleeve (not your hands). . Avoid shaking hands with others and consider head nods or verbal greetings only. . Avoid touching your eyes, nose, or mouth with unwashed hands.  . Avoid close contact with people who are sick. . Avoid places or events with large numbers of people in one location, like concerts or sporting events. . Carefully consider travel plans you have or are making. . If you are planning any travel outside or inside the [place], visit the CDC's Travelers' Health webpage for the latest health notices. . If you have some symptoms but not all symptoms, continue to monitor at home and seek medical attention if your symptoms worsen. . If you are having a medical emergency, call 911.   ADDITIONAL HEALTHCARE OPTIONS FOR PATIENTS  [facility] / e-Visit: [email or web address]         [facility]: [phone]  [facility]: [phone]                   [facility]: [phone]   

## 2019-08-24 NOTE — Progress Notes (Signed)
  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  Identity was confirmed.  All relevant records and images related to the planned course of therapy were reviewed.  The patient freely provided informed written consent to proceed with treatment after reviewing the details related to the planned course of therapy. The consent form was witnessed and verified by the simulation staff.  Then, the patient was set-up in a stable reproducible  supine position for radiation therapy.  CT images were obtained.  Surface markings were placed.  The CT images were loaded into the planning software.  Then the target and avoidance structures were contoured.  Treatment planning then occurred.  The radiation prescription was entered and confirmed.   I have requested : Brachytherapy Isodose Plan and Dosimetry Calculations to plan the radiation distribution.    PLAN:  The patient will receive 30 Gy in 6 fractions directed at the upper vaginal area.  The patient will be treated with a 2.5 cm segmented cylinder with a treatment length of 4 cm.  Prescription will be to a depth of 0.5 cm.  Iridium 192 will be the high-dose-rate source    ________________________________  Blair Promise, PhD, MD

## 2019-08-24 NOTE — Progress Notes (Signed)
Pt presents today for new [facility] HDR with Dr. Sondra Come. Pt denies c/o pain at this time. Pt denies dysuria/hematuria. Pt denies vaginal bleeding/discharge. Pt denies rectal bleeding, diarrhea/constipation. Pt denies abdominal bloating, N/V.  BP 108/87 (BP Location: Left Arm, Patient Position: Sitting)   Pulse 78   Temp 98.3 F (36.8 C) (Temporal)   Resp 18   Ht 5\' 8"  (1.727 m)   Wt 280 lb 8 oz (127.2 kg)   LMP  (LMP Unknown)   SpO2 98%   BMI 42.65 kg/m   Wt Readings from Last 3 Encounters:  08/24/19 280 lb 8 oz (127.2 kg)  08/10/19 280 lb (127 kg)  08/04/19 282 lb 6 oz (128.1 kg)   Loma Sousa, RN BSN

## 2019-08-27 ENCOUNTER — Telehealth: Payer: Self-pay

## 2019-08-27 ENCOUNTER — Telehealth: Payer: Self-pay | Admitting: *Deleted

## 2019-08-27 NOTE — Telephone Encounter (Signed)
Pt called to report mild irritation following initial [place] HDR treatment. Pt reports that her irritation and pain is being relieved by pain medications. Pt reports that she does not feel as though she has a UTI. Pt very concerned she will not be able to have her next HDR treatment. This RN attempted to reassure pt several times that she will be able to have her treatment, unless her symptoms would worsen but that these symptoms are normal and are being relieved. Pt ruminating and this RN strongly encouraged pt to ask Dr. Sondra Come her question on how the treatment works. Pt verbalized agreement. Loma Sousa, RN BSN

## 2019-08-27 NOTE — Telephone Encounter (Signed)
Called patient to remind of HDR Tx. for 08-30-19 @ 11 am lvm for a return call

## 2019-08-30 ENCOUNTER — Telehealth: Payer: Self-pay | Admitting: *Deleted

## 2019-08-30 ENCOUNTER — Ambulatory Visit
Admission: RE | Admit: 2019-08-30 | Discharge: 2019-08-30 | Disposition: A | Discharge status: Home / Self Care | Payer: Medicaid Other | Source: Ambulatory Visit | Attending: Radiation Oncology | Admitting: Radiation Oncology

## 2019-08-30 ENCOUNTER — Telehealth: Payer: Self-pay

## 2019-08-30 ENCOUNTER — Other Ambulatory Visit: Payer: Self-pay

## 2019-08-30 DIAGNOSIS — R300 Dysuria: Secondary | ICD-10-CM

## 2019-08-30 DIAGNOSIS — C52 Malignant neoplasm of vagina: Secondary | ICD-10-CM | POA: Diagnosis not present

## 2019-08-30 LAB — URINALYSIS, COMPLETE (UACMP) WITH MICROSCOPIC
Bilirubin Urine: NEGATIVE
Glucose, UA: NEGATIVE mg/dL
Ketones, ur: NEGATIVE mg/dL
Nitrite: POSITIVE — AB
Protein, ur: 30 mg/dL — AB
Specific Gravity, Urine: 1.031 — ABNORMAL HIGH (ref 1.005–1.030)
pH: 5 (ref 5.0–8.0)

## 2019-08-30 NOTE — Telephone Encounter (Signed)
Contacted pt and conveyed to her that while the UA was resulted, the urine culture would not be back for approximately 2 days. Conveyed to pt that this RN would contact pt once that lab had been resulted. Pt ruminating on future HDR treatments. This RN conveyed to pt that pt would be able to receive future HDR treatments. Loma Sousa, RN BSN

## 2019-08-30 NOTE — Telephone Encounter (Signed)
CALLED PATIENT TO INFORM THAT LAB RESULTS WOULD NOT BE IN UNTIL TOMORROW, PATIENT SAID THAT THE LAB STATED THAT THE RESULTS SHOULD BE IN TODAY, TOLD PATIENT THAT I WOULD INFORM JILL, RN FOR DR. Gery Pray

## 2019-08-30 NOTE — Progress Notes (Signed)
  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 She will be treated with a 2.5 cm diameter segmented cylinder. This conforms to her anatomy without undue discomfort.  Vaginal brachytherapy procedure node: The patient was brought to the [place] suite. Identity was confirmed. All relevant records and images related to the planned course of therapy were reviewed. The patient freely provided informed written consent to proceed with treatment after reviewing the details related to the planned course of therapy. The consent form was witnessed and verified by the simulation staff. Then, the patient was set-up in a stable reproducible supine position for radiation therapy. Pelvic exam revealed the vaginal vault was noted to be intact. The patient's custom vaginal cylinder was placed in the proximal vagina. This was affixed to the CT/MR stabilization plate to prevent slippage. Patient tolerated the placement well.  Verification simulation note:  A fiducial marker was placed within the vaginal cylinder. An AP and lateral film was then obtained through the pelvis area. This documented accurate position of the vaginal cylinder for treatment.  HDR BRACHYTHERAPY TREATMENT  The remote afterloading device was affixed to the vaginal cylinder by catheter. Patient then proceeded to undergo her second high-dose-rate treatment directed at the proximal vagina. The patient was prescribed a dose of 6.0 gray to be delivered to the mucosal surface. Treatment length was 4.0 cm. Patient was treated with 1 channel using 9 dwell positions. Treatment time was 323.70 seconds. Iridium 192 was the  high-dose-rate source for treatment. The patient tolerated the treatment well. After completion of her therapy, a radiation survey was performed documenting return of the iridium source into the [employer] safe.   PLAN: Patient will return next week for third high-dose-rate treatment ________________________________  Blair Promise, PhD, MD

## 2019-09-01 LAB — URINE CULTURE: Culture: >=100000 — AB

## 2019-09-02 ENCOUNTER — Other Ambulatory Visit: Payer: Self-pay | Admitting: Radiation Oncology

## 2019-09-02 ENCOUNTER — Telehealth: Payer: Self-pay

## 2019-09-02 ENCOUNTER — Telehealth: Payer: Self-pay | Admitting: *Deleted

## 2019-09-02 MED ORDER — NITROFURANTOIN MONOHYD MACRO 100 MG PO CAPS: 100.0000 mg | ORAL_CAPSULE | Freq: Two times a day (BID) | ORAL | 0 refills | Status: DC

## 2019-09-02 NOTE — Telephone Encounter (Signed)
Patient called and would like to be set up with come counseling for her cancer diagnosis. Message forwarded to [facility] and [place] social worker

## 2019-09-02 NOTE — Telephone Encounter (Signed)
Left detailed VM on pt's identified cell. Per Dr. Sondra Come, pt with UTI per urine culture. Antibiotic prescribed to pharmacy on record. Conveyed that pt is to take antibiotic one tab twice daily for one week. Conveyed to pt that UTI will not hinder any further [facility] HDR treatments. Loma Sousa, RN BSN

## 2019-09-03 NOTE — Progress Notes (Signed)
Spoke to the patient on 09/03/2019 via phone for a phone counseling intake session. Pt reported feeling overwhelmed and anxious about her cancer diagnosis. Pt stated she experiences nightmares related to her cancer dx. Pt stated she receives emotional support from some family and her boyfriend, but has felt isolated by friends since her dx. Pt stated she is hopeful and will celebrate when her tx is complete.      Next counseling session:  Tuesday 09/07/2019 at 12:30pm via phone  [name] [facility] Counseling Intern Voicemail:  [phone]

## 2019-09-07 ENCOUNTER — Telehealth: Payer: Self-pay | Admitting: *Deleted

## 2019-09-07 ENCOUNTER — Telehealth: Payer: Self-pay

## 2019-09-07 NOTE — Telephone Encounter (Signed)
RETURNED PATIENT'S PHONE CALL, SPOKE WITH PATIENT. ?

## 2019-09-07 NOTE — Telephone Encounter (Signed)
Called patient for scheduled phone counseling appointment.  Left Voicemail when pt did not answer. Counseling intern will follow-up with patient to reschedule for later today.    [name] [place] Counseling Intern Voicemail:  [phone]

## 2019-09-07 NOTE — Telephone Encounter (Signed)
Called patient to ask if she can come Thursday (09/09/19) @ 9:30 am for HDR Tx. Instead of 11:00 am, spoke with patient and she is aware of the change.

## 2019-09-08 ENCOUNTER — Telehealth: Payer: Self-pay | Admitting: *Deleted

## 2019-09-08 NOTE — Telephone Encounter (Signed)
CALLED PATIENT TO REMIND OF HDR TX. FOR 09-09-19 @ 9:30 AM, LVM FOR A RETURN CALL

## 2019-09-09 ENCOUNTER — Ambulatory Visit
Admission: RE | Admit: 2019-09-09 | Discharge: 2019-09-09 | Disposition: A | Discharge status: Home / Self Care | Payer: Medicaid Other | Source: Ambulatory Visit | Attending: Radiation Oncology | Admitting: Radiation Oncology

## 2019-09-09 ENCOUNTER — Ambulatory Visit: Payer: Medicaid Other | Admitting: Radiation Oncology

## 2019-09-09 ENCOUNTER — Ambulatory Visit: Payer: Self-pay | Admitting: Radiation Oncology

## 2019-09-09 ENCOUNTER — Other Ambulatory Visit: Payer: Self-pay

## 2019-09-09 DIAGNOSIS — C52 Malignant neoplasm of vagina: Secondary | ICD-10-CM | POA: Diagnosis not present

## 2019-09-09 NOTE — Progress Notes (Signed)
  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 She will be treated with a 2.5 cm diameter segmented cylinder. This conforms to her anatomy without undue discomfort.  Vaginal brachytherapy procedure node: The patient was brought to the [place] suite. Identity was confirmed. All relevant records and images related to the planned course of therapy were reviewed. The patient freely provided informed written consent to proceed with treatment after reviewing the details related to the planned course of therapy. The consent form was witnessed and verified by the simulation staff. Then, the patient was set-up in a stable reproducible supine position for radiation therapy. Pelvic exam revealed the vaginal vault was noted to be intact. The patient's custom vaginal cylinder was placed in the proximal vagina. This was affixed to the CT/MR stabilization plate to prevent slippage. Patient tolerated the placement well.  Verification simulation note:  A fiducial marker was placed within the vaginal cylinder. An AP and lateral film was then obtained through the pelvis area. This documented accurate position of the vaginal cylinder for treatment.  HDR BRACHYTHERAPY TREATMENT  The remote afterloading device was affixed to the vaginal cylinder by catheter. Patient then proceeded to undergo her third high-dose-rate treatment directed at the proximal vagina. The patient was prescribed a dose of 6.0 gray to be delivered to the mucosal surface. Treatment length was 4.0 cm. Patient was treated with 1 channel using 9 dwell positions. Treatment time was 355.8 seconds. Iridium 192 was the high-dose-rate  source for treatment. The patient tolerated the treatment well. After completion of her therapy, a radiation survey was performed documenting return of the iridium source into the GammaMed safe.   PLAN: Patient will return next week for her fourth high-dose-rate treatment ________________________________  Blair Promise, PhD, MD   This document serves as a record of services personally performed by Gery Pray, MD. It was created on his behalf by Wilburn Mylar, a trained medical scribe. The creation of this record is based on the scribe's personal observations and the provider's statements to them. This document has been checked and approved by the attending provider.

## 2019-09-13 ENCOUNTER — Telehealth: Payer: Self-pay | Admitting: *Deleted

## 2019-09-13 NOTE — Telephone Encounter (Signed)
CALLED PATIENT TO REMIND OF HDR TX. FOR 09-14-19 @ 9 AM, LVM FOR A RETURN CALL

## 2019-09-14 ENCOUNTER — Ambulatory Visit
Admission: RE | Admit: 2019-09-14 | Discharge: 2019-09-14 | Disposition: A | Discharge status: Home / Self Care | Payer: Medicaid Other | Source: Ambulatory Visit | Attending: Radiation Oncology | Admitting: Radiation Oncology

## 2019-09-14 ENCOUNTER — Other Ambulatory Visit: Payer: Self-pay

## 2019-09-14 DIAGNOSIS — C52 Malignant neoplasm of vagina: Secondary | ICD-10-CM | POA: Diagnosis not present

## 2019-09-14 NOTE — Progress Notes (Signed)
  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 She will be treated with a 2.5 cm diameter segmented cylinder. This conforms to her anatomy without undue discomfort.  Vaginal brachytherapy procedure node: The patient was brought to the [place] suite. Identity was confirmed. All relevant records and images related to the planned course of therapy were reviewed. The patient freely provided informed written consent to proceed with treatment after reviewing the details related to the planned course of therapy. The consent form was witnessed and verified by the simulation staff. Then, the patient was set-up in a stable reproducible supine position for radiation therapy. Pelvic exam revealed the vaginal vault was noted to be intact. The patient's custom vaginal cylinder was placed in the proximal vagina. This was affixed to the CT/MR stabilization plate to prevent slippage. Patient tolerated the placement well.  Verification simulation note:  A fiducial marker was placed within the vaginal cylinder. An AP and lateral film was then obtained through the pelvis area. This documented accurate position of the vaginal cylinder for treatment.  HDR BRACHYTHERAPY TREATMENT  The remote afterloading device was affixed to the vaginal cylinder by catheter. Patient then proceeded to undergo her fourth high-dose-rate treatment directed at the proximal vagina. The patient was prescribed a dose of 6.0 gray to be delivered to the mucosal surface. Treatment length was 4.0 cm. Patient was treated with 1 channel using 9 dwell positions. Treatment time was 372.8 seconds. Iridium 192 was the  high-dose-rate source for treatment. The patient tolerated the treatment well. After completion of her therapy, a radiation survey was performed documenting return of the iridium source into the GammaMed safe.   PLAN: Patient will return next week for her fifth high-dose-rate treatment ________________________________  Blair Promise, PhD, MD   This document serves as a record of services personally performed by Gery Pray, MD. It was created on his behalf by Wilburn Mylar, a trained medical scribe. The creation of this record is based on the scribe's personal observations and the provider's statements to them. This document has been checked and approved by the attending provider.

## 2019-09-17 ENCOUNTER — Telehealth: Payer: Self-pay

## 2019-09-17 NOTE — Telephone Encounter (Signed)
Pt called with c/o vaginal tingling.  I told her this was likely from her radiation therapy.   She states I called radiation and "they aren't open today".  I suggested she use cool compresses, ibuprofen and tylenol.  She verbalized understanding.

## 2019-09-20 ENCOUNTER — Other Ambulatory Visit: Payer: Self-pay | Admitting: Gynecologic Oncology

## 2019-09-20 ENCOUNTER — Telehealth: Payer: Self-pay | Admitting: *Deleted

## 2019-09-20 ENCOUNTER — Telehealth: Payer: Self-pay

## 2019-09-20 DIAGNOSIS — C52 Malignant neoplasm of vagina: Secondary | ICD-10-CM

## 2019-09-20 MED ORDER — IBUPROFEN 800 MG PO TABS: 800.0000 mg | ORAL_TABLET | Freq: Three times a day (TID) | ORAL | 4 refills | Status: DC | PRN

## 2019-09-20 NOTE — Progress Notes (Signed)
Spoke to patient on 09/20/2019 via phone.  Counseling intern provided therapeutic listening and space for patient to process her emotional response to her cancer experience. Pt reported distress about waiting six months to see her doctor after she completes radiation. Pt stated she is nervous that the cancer will come back and spread during the time between her last radiation and her 6 month follow-up appt and she fears that her health will be in jeopardy. Pt stated she is angry about not getting the inormation she was seeking to understand the rationale behind her treatment plan. Pt stated she will try one more time to ask a nurse on her medical team for clarification and specifically ask about the risk of waiting six months so she can better understand her level of safety during this period of waiting. Pt stated she is unable to shift to a more hopeful and positive outlook without the information she believes she needs to understand her tx plan and the state of her cancer post radiation.   [name] [facility] Counseling Intern Voicemail:  [phone]

## 2019-09-20 NOTE — Telephone Encounter (Signed)
Attempted to return pt's VM re: questions re: f/u after last HDR treatment on 09/22/19. Left detailed VM that pt will receive one month f/u appt at last treatment and will alternate f/u appts bewteen [name] and Dr. Sondra Come. This RN's direct number left for further questions/concerns. Loma Sousa, RN BSN

## 2019-09-20 NOTE — Telephone Encounter (Signed)
RETURNED PATIENT'S PHONE CALL, LVM FOR A RETURN CALL 

## 2019-09-21 ENCOUNTER — Telehealth: Payer: Self-pay | Admitting: *Deleted

## 2019-09-21 NOTE — Telephone Encounter (Signed)
Called patient to remind of HDR Tx. for 09-22-19 @ 10 am, spoke with patient and she is aware of this appt.

## 2019-09-22 ENCOUNTER — Ambulatory Visit
Admission: RE | Admit: 2019-09-22 | Discharge: 2019-09-22 | Disposition: A | Discharge status: Home / Self Care | Payer: Medicaid Other | Source: Ambulatory Visit | Attending: Radiation Oncology | Admitting: Radiation Oncology

## 2019-09-22 ENCOUNTER — Other Ambulatory Visit: Payer: Self-pay

## 2019-09-22 ENCOUNTER — Telehealth: Payer: Self-pay | Admitting: *Deleted

## 2019-09-22 ENCOUNTER — Encounter: Payer: Self-pay | Admitting: Radiation Oncology

## 2019-09-22 DIAGNOSIS — C52 Malignant neoplasm of vagina: Secondary | ICD-10-CM | POA: Diagnosis present

## 2019-09-22 NOTE — Telephone Encounter (Signed)
RETURNED PATIENT'S PHONE CALL, LVM FOR A RETURN CALL 

## 2019-09-22 NOTE — Progress Notes (Signed)
  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 She will be treated with a 2.5 cm diameter segmented cylinder. This conforms to her anatomy without undue discomfort.  Vaginal brachytherapy procedure node: The patient was brought to the [facility] [place] suite. Identity was confirmed. All relevant records and images related to the planned course of therapy were reviewed. The patient freely provided informed written consent to proceed with treatment after reviewing the details related to the planned course of therapy. The consent form was witnessed and verified by the simulation staff. Then, the patient was set-up in a stable reproducible supine position for radiation therapy. Pelvic exam revealed the vaginal vault was noted to be intact. The patient's custom vaginal cylinder was placed in the proximal vagina. This was affixed to the CT/MR stabilization plate to prevent slippage. Patient tolerated the placement well.  Verification simulation note:  A fiducial marker was placed within the vaginal cylinder. An AP and lateral film was then obtained through the pelvis area. This documented accurate position of the vaginal cylinder for treatment.  HDR BRACHYTHERAPY TREATMENT  The remote afterloading device was affixed to the vaginal cylinder by catheter. Patient then proceeded to undergo her fifth high-dose-rate treatment directed at the proximal vagina. The patient was prescribed a dose of 6.0 gray to be delivered to the mucosal surface. Treatment length was 4.0 cm. Patient was treated with 1 channel using 9 dwell positions. Treatment time was 401.9 seconds. Iridium 192 was the high-dose-rate  source for treatment. The patient tolerated the treatment well except for some discomfort when the cylinder reached the upper vaginal area.  After completion of her therapy, a radiation survey was performed documenting return of the iridium source into the [employer] safe.   PLAN: Patient has completed her postop radiation therapy.  She will be scheduled for routine follow-up in approximately a month. ________________________________    Blair Promise, PhD, MD  This document serves as a record of services personally performed by Gery Pray, MD. It was created on his behalf by Clerance Lav, a trained medical scribe. The creation of this record is based on the scribe's personal observations and the provider's statements to them. This document has been checked and approved by the attending provider.

## 2019-09-23 ENCOUNTER — Ambulatory Visit: Payer: Medicaid Other | Admitting: Radiation Oncology

## 2019-09-23 ENCOUNTER — Telehealth: Payer: Self-pay

## 2019-09-23 NOTE — Telephone Encounter (Signed)
Contacted pt and gave new appt date and time of 10/25/19 at 1430. Pt with circular thinking and consistently switching between which appt/time, Dr. Sondra Come in January vs [name] in April. Conveyed to pt that this RN was calling from Dr. Clabe Seal office and could not alter Dr. Serita Grit appt. Pt stated "oh ok I'm sorry". Pt then requesting a home health nurse to check on her after [place]. Conveyed to pt that procedure did not require much after care and that she should rest and take it easy. Pt then with pressured speech that she has "other appts and things to do and I don't have it as fortunate as SOME people do". This RN conveyed to pt that if she felt she needed a [place], that she would need to speak with her PCP as she did not meet criteria from a radiation standpoint. Pt stated, "oh well then I'm sorry for asking". Again, reiterated pt's f/u appt time/date with Dr. Sondra Come. Pt verbalized understanding. Loma Sousa, RN BSN

## 2019-09-27 ENCOUNTER — Ambulatory Visit: Payer: Medicaid Other | Admitting: Radiation Oncology

## 2019-09-27 NOTE — Progress Notes (Signed)
Spoke to patient on 09/27/19 for a counseling appointment via phone. Pt  stated she experienced significant pain and discomfort over the weekend after her final radiation treatment, but chose to "wait it out" at home instead of going to the ED. Pt stated that at times she has questioned if all the pain and effort was "worth it", but that she realizes wants to live and be cancer free and this was the only way. Pt stated she is grateful to have completed her tx and is hoping to feel better in the weeks to come. Pt reported she felt relieved that she got the information she was seeking from her doctor at her last radiation appt about what to expect in the next months. Pt recognized the inner strength and perseverance she demonstrated during her tx. Counseling intern reflected that completing cancer tx is a big accomplishment. Pt reported she continues to have thoughts about her cancer returning and counseling goal will be for client learn strategies to reduce the impact of these intrusive thoughts. Pt reported she is starting group therapy this week, but that she has some reservations about revealing personal information in a group setting. Counseling intern normalized her feelings and informed her that she gets to choose what she divulges to the group and that groups can be a powerful support system and growth opportunity for some people. Pt stated she is willing to give the group a try.   Next Scheduled Counseling Appt: Monday, October 04, 2019 at 1:00pm via phone  [name] Counseling Intern Voicemail:  [phone]

## 2019-09-30 ENCOUNTER — Ambulatory Visit: Payer: Medicaid Other | Admitting: Radiation Oncology

## 2019-09-30 ENCOUNTER — Telehealth: Payer: Self-pay

## 2019-09-30 ENCOUNTER — Telehealth: Payer: Self-pay | Admitting: Oncology

## 2019-09-30 ENCOUNTER — Other Ambulatory Visit: Payer: Self-pay

## 2019-09-30 ENCOUNTER — Encounter (HOSPITAL_COMMUNITY): Payer: Self-pay

## 2019-09-30 ENCOUNTER — Other Ambulatory Visit: Payer: Self-pay | Admitting: Radiation Oncology

## 2019-09-30 ENCOUNTER — Emergency Department (HOSPITAL_COMMUNITY)
Admission: EM | Admit: 2019-09-30 | Discharge: 2019-09-30 | Disposition: A | Discharge status: Home / Self Care | Payer: Medicaid Other | Attending: Emergency Medicine | Admitting: Emergency Medicine

## 2019-09-30 DIAGNOSIS — Z923 Personal history of irradiation: Secondary | ICD-10-CM | POA: Insufficient documentation

## 2019-09-30 DIAGNOSIS — N3000 Acute cystitis without hematuria: Secondary | ICD-10-CM | POA: Diagnosis not present

## 2019-09-30 DIAGNOSIS — C52 Malignant neoplasm of vagina: Secondary | ICD-10-CM

## 2019-09-30 DIAGNOSIS — R102 Pelvic and perineal pain: Secondary | ICD-10-CM | POA: Diagnosis present

## 2019-09-30 LAB — URINALYSIS, ROUTINE W REFLEX MICROSCOPIC
Bilirubin Urine: NEGATIVE
Glucose, UA: NEGATIVE mg/dL
Ketones, ur: NEGATIVE mg/dL
Nitrite: POSITIVE — AB
Protein, ur: >=300 mg/dL — AB
RBC / HPF: >50 RBC/hpf — ABNORMAL HIGH (ref 0–5)
Specific Gravity, Urine: 1.022 (ref 1.005–1.030)
WBC, UA: >50 WBC/hpf — ABNORMAL HIGH (ref 0–5)
pH: 6 (ref 5.0–8.0)

## 2019-09-30 MED ORDER — OXYCODONE-ACETAMINOPHEN 10-325 MG PO TABS: 1.0000 | ORAL_TABLET | ORAL | 0 refills | Status: DC | PRN

## 2019-09-30 MED ORDER — HYDROMORPHONE HCL 1 MG/ML IJ SOLN
1.0000 mg | Freq: Once | INTRAMUSCULAR | Status: AC
  Administered 2019-09-30: 1 mg via INTRAVENOUS
  Filled 2019-09-30: qty 1

## 2019-09-30 MED ORDER — CEPHALEXIN 250 MG PO CAPS: 250.0000 mg | ORAL_CAPSULE | Freq: Four times a day (QID) | ORAL | 0 refills | Status: AC

## 2019-09-30 MED ORDER — OXYCODONE HCL 5 MG PO TABS: 5.0000 mg | ORAL_TABLET | ORAL | 0 refills | Status: DC | PRN

## 2019-09-30 NOTE — Discharge Instructions (Addendum)
Your vaginal pain is likely related to you ongoing vaginal cancer. We are giving one dose of your home oxycodone until you can follow up. You also have a UTI. We are treating you with the antibiotic keflex. Please take it as prescribed. Come back to the ED if you develop worsening pain, fevers, chills, nausea, vomiting, or any other worrisome symptom.

## 2019-09-30 NOTE — Telephone Encounter (Signed)
Returning 2 VM from pt. VM both state that she is in ED, exam unremarkable, and wants to know what she should do. Attempted to contact pt. Left VM detailing that Dr. Sondra Come is out of office on Fridays and earliest appt with Dr. Sondra Come may be Tuesday. Encouraged pt to wait for instructions from ED provider. Encouraged pt to contact Dr. Serita Grit office if she would get home and pain would not be managed with any prescribed pain medications from ED. Loma Sousa, RN BSN

## 2019-09-30 NOTE — Telephone Encounter (Signed)
Called Jordan [name] and scheduled appointment to follow up with Dr. Sondra Come on 10/04/19 at 11:15.  She said she is going to arrange Medicaid transportation and will let us know if she needs to cancel.

## 2019-09-30 NOTE — ED Provider Notes (Signed)
[facility] DEPT Provider Note   CSN: [id number] Arrival date & time: 09/30/19  1043     History Chief Complaint  Patient presents with  . Vaginal Pain  . Medication Refill    Jordan [name] is a 53 y.o. female.  Patient with a past medical history of Alcohol abuse, Anemia, Bipolar 1 disorder ([place]), , Megaloblastic anemia , , Obesity, 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[place]), chronic UTI presents with acute on chronic vaginal pain and increased urinary frequency.  Patient states that she is on chronic opioids and ibuprofen for vaginal pain related to her cancer.  Patient is status post surgical resection 07/2019 and radiation therapy over the past month.  Patient's last radiation therapy was on 12/2.  Patient describes a change in pain sensation in intensity.  Patient says that the pain is like "needles" in the vagina.  Describes the intensity has much higher today.  Patient's last pain medication was ibuprofen this a.m.  Patient describes chronic UTI, has been on antibiotic prophylaxis in the past, however she discontinued due to "not working".  Says that she is followed with multiple specialist for this.  Patient denies any vaginal bleeding, leakage of fluids, abdominal pain, fevers, chills, dysuria, diarrhea, constipation.  The history is provided by the patient.       Past Medical History:  Diagnosis Date  . Alcohol abuse   . Anemia    patient denies  . Bipolar 1 disorder (HCC)    No medications currently  . CAP (community acquired pneumonia) 03/17/2015  . 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 Mental disorder   . Obesity   . PICC line infection 05/17/2015  . Sepsis due to Gram negative bacteria (MDR E Coli) 02/18/2015  . 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[place])   . UTI (lower urinary tract infection)   . Vaginal Pap smear, abnormal     Patient Active Problem List   Diagnosis Date Noted  . Vaginal cancer ([place]) 07/22/2019   . Morbid obesity with BMI of 40.0-44.9, adult ([place]) 06/11/2019  . 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[place]) 05/28/2019  . Visit for routine gyn exam 04/21/2019  . Vaginal atrophy 04/21/2019  . Painful lumpy right breast 07/27/2016  . Urinary tract infection 07/01/2015  . Diastolic dysfunction [id number]  . Constipation 05/12/2015  . ESBL (extended spectrum beta-lactamase) producing bacteria infection 03/17/2015  . Diarrhea 03/03/2015  . Weakness 03/02/2015  . 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 Generalized anxiety disorder 02/20/2015  . Claustrophobia 02/20/2015  . Transaminitis 02/18/2015  . Chest pain 02/18/2015  . Abdominal pain   . Dizziness   . Hypotension 01/04/2015  . Bipolar disorder ([place]) 06/29/2012    Past Surgical History:  Procedure Laterality Date  . ABDOMINAL HYSTERECTOMY    . CERVICAL CONIZATION W/BX N/A 07/01/2016   Procedure: CONIZATION CERVIX WITH BIOPSY;  Surgeon: Chancy Milroy, MD;  Location: [facility];  Service: Gynecology;  Laterality: N/A;  . CHOLECYSTECTOMY    . HYSTEROSCOPY W/D&C N/A 01/25/2016   Procedure: DILATATION AND CURETTAGE /HYSTEROSCOPY;  Surgeon: Mora Bellman, MD;  Location: [facility];  Service: Gynecology;  Laterality: N/A;  . LYMPH NODE BIOPSY Bilateral 07/22/2019   Procedure: LYMPH NODE BIOPSY;  Surgeon: Everitt Amber, MD;  Location: [facility];  Service: Gynecology;  Laterality: Bilateral;  . ROBOT ASSISTED MYOMECTOMY N/A 07/22/2019   Procedure: XI ROBOTIC ASSISTED LAPAROSCOPIC RADICAL UPPER VAGINECTOMY, LEFT SALPINECTOMY, RIGHT SALPINGOOOPHERECTOMY;  Surgeon: Everitt Amber, MD;  Location: [facility];  Service: Gynecology;  Laterality: N/A;  . VAGINAL HYSTERECTOMY N/A 03/11/2017   Procedure: HYSTERECTOMY VAGINAL WITH MORCELLATION;  Surgeon: Chancy Milroy, MD;  Location: [facility];  Service: Gynecology;  Laterality: N/A;     OB History    Gravida  1   Para  1   Term  1   Preterm      AB      Living  1     SAB      TAB      Ectopic       Multiple      Live Births  1           Family History  Problem Relation Age of Onset  . Alcohol abuse Father   . Cancer Father   . Breast cancer Neg Hx     Social History   Tobacco Use  . Smoking status: Never Smoker  . Smokeless tobacco: Never Used  Substance Use Topics  . Alcohol use: Not Currently  . Drug use: No    Home Medications Prior to Admission medications   Medication Sig Start Date End Date Taking? Authorizing Provider  acetaminophen (TYLENOL) 500 MG tablet Take 1-2 tablets (500-1,000 mg total) by mouth every 6 (six) hours as needed for moderate pain. 07/30/19   Cross, Lenna Sciara D, NP  cephALEXin (KEFLEX) 250 MG capsule Take 1 capsule (250 mg total) by mouth 4 (four) times daily for 10 days. 09/30/19 10/10/19  Bonnita Hollow, MD  ibuprofen (ADVIL) 800 MG tablet Take 1 tablet (800 mg total) by mouth every 8 (eight) hours as needed. 09/20/19   Everitt Amber, MD  lurasidone (LATUDA) 20 MG TABS tablet Take 20 mg by mouth daily.    [provider]  nitrofurantoin, macrocrystal-monohydrate, (MACROBID) 100 MG capsule Take 1 capsule (100 mg total) by mouth 2 (two) times daily. 09/02/19   Gery Pray, MD  oxyCODONE (OXY IR/ROXICODONE) 5 MG immediate release tablet Take 1 tablet (5 mg total) by mouth every 4 (four) hours as needed for severe pain or breakthrough pain. 07/23/19   Everitt Amber, MD  Oxycodone HCl 10 MG TABS Take 10 mg by mouth 2 (two) times daily as needed (pain).  01/07/19   [provider]  oxyCODONE-acetaminophen (PERCOCET) 10-325 MG tablet Take 1 tablet by mouth every 4 (four) hours as needed for up to 1 dose for pain. 09/30/19   Bonnita Hollow, MD  senna-docusate (SENOKOT-S) 8.6-50 MG tablet Take 2 tablets by mouth at bedtime. 07/23/19   Everitt Amber, MD  Vitamin D, Ergocalciferol, (DRISDOL) 1.25 MG (50000 UT) CAPS capsule Take 50,000 Units by mouth once a week. 02/01/19   [provider]    Allergies    Ciprofloxacin, Citalopram,  Lamotrigine, Sulfa antibiotics, and Tramadol  Review of Systems   Review of Systems As per HPI Physical Exam Updated Vital Signs BP (!) 145/82   Pulse 96   Temp 97.9 F (36.6 C) (Oral)   Resp 16   Ht 5\' 8"  (1.727 m)   Wt 113.4 kg   LMP  (LMP Unknown)   SpO2 99%   BMI 38.01 kg/m   Physical Exam Vitals reviewed. Exam conducted with a chaperone present.  Constitutional:      Appearance: She is normal weight.     Comments: Appears mildly uncomfortable  HENT:     Head: Normocephalic and atraumatic.     Nose: Nose normal.     Mouth/Throat:     Mouth: Mucous membranes  are moist.  Cardiovascular:     Rate and Rhythm: Normal rate and regular rhythm.  Pulmonary:     Effort: Pulmonary effort is normal.     Breath sounds: Normal breath sounds.  Abdominal:     General: Abdomen is flat.     Tenderness: There is no abdominal tenderness.     Hernia: There is no hernia in the left inguinal area or right inguinal area.  Genitourinary:    Pubic Area: No rash.      Vagina: No vaginal discharge.     Cervix: No cervical motion tenderness or discharge.     Uterus: Normal.      Adnexa: Right adnexa normal and left adnexa normal.  Musculoskeletal:        General: Normal range of motion.     Cervical back: Normal range of motion.     Right lower leg: No edema.     Left lower leg: No edema.  Skin:    General: Skin is warm and dry.  Neurological:     General: No focal deficit present.     Mental Status: She is alert and oriented to person, place, and time.  Psychiatric:        Mood and Affect: Mood normal.        Behavior: Behavior normal.     ED Results / Procedures / Treatments   Labs (all labs ordered are listed, but only abnormal results are displayed) Labs Reviewed  URINALYSIS, ROUTINE W REFLEX MICROSCOPIC - Abnormal; Notable for the following components:      Result Value   APPearance CLOUDY (*)    Hgb urine dipstick LARGE (*)    Protein, ur >=300 (*)    Nitrite  POSITIVE (*)    Leukocytes,Ua LARGE (*)    RBC / HPF >50 (*)    WBC, UA >50 (*)    Bacteria, UA MANY (*)    All other components within normal limits  URINE CULTURE    EKG None  Radiology No results found.  Procedures Procedures (including critical care time)  Medications Ordered in ED Medications  HYDROmorphone (DILAUDID) injection 1 mg (1 mg Intravenous Given 09/30/19 1435)    ED Course  I have reviewed the triage vital signs and the nursing notes.  Pertinent labs & imaging results that were available during my care of the patient were reviewed by me and considered in my medical decision making (see chart for details).    MDM Rules/Calculators/A&P     CHA2DS2/VAS Stroke Risk Points      N/A >= 2 Points: High Risk  1 - 1.99 Points: Medium Risk  0 Points: Low Risk    A final score could not be computed because of missing components.: Last  Change: N/A     This score determines the patient's risk of having a stroke if the  patient has atrial fibrillation.      This score is not applicable to this patient. Components are not  calculated.                   Patient with history of vaginal cancer status post surgery and radiation presents with acute on chronic vaginal pain.  Patient on chronic opioids for back pain. Unremarkable vaginal exam.  She is out of these.  Will give 1 dose of Dilaudid for pain management. Patient to follow up with Oncologist for further pain management.   Patient also found with UTI. Urine culture sent. Will treat empirically  with keflex. Patient to follow up with PCP if symptoms do not improve.        final Clinical Impression(s) / ED Diagnoses Final diagnoses:  Vaginal pain  Acute cystitis without hematuria    Rx / DC Orders ED Discharge Orders         Ordered    cephALEXin (KEFLEX) 250 MG capsule  4 times daily     09/30/19 1457    oxyCODONE-acetaminophen (PERCOCET) 10-325 MG tablet  Every 4 hours PRN     09/30/19 1457            Bonnita Hollow, MD 09/30/19 [place], [place], DO 09/30/19 1507

## 2019-09-30 NOTE — ED Notes (Signed)
Pt placed on bedpan at patient's request

## 2019-09-30 NOTE — Telephone Encounter (Signed)
Pt calling to report "needles up in there" re: vaginal cuff pain. Pt crying and shouting. Conveyed to pt that Dr. Sondra Come could examine her at 1300 in the clinic. Pt shouting that she does not have "a way there unless I go to the hospital", meaning the ED. Pt crying. Pt reports that Ibuprofen only relieves pain for 30 min at a time. Inquired if pt could use Oxycodone. Pt states that oxycodone "couldn't be filled until the 15th". Pt crying and shouting that she was in pain from "needles up in there". Speech unintelligible at times. Again, offered appt for exam at 1300. Pt declined due to no transportation. Encouraged pt to present to ED if she needed pain relief and exam. Pt repeatedly asking if Dr. Sondra Come could examine her in ED. Conveyed to pt that ED physician/provider would be the one to examine her. Pt shouting/crying unintelligible. Pt ended called. Loma Sousa, RN BSN

## 2019-09-30 NOTE — Telephone Encounter (Signed)
Pt calling from ED waiting room. Pt stating that wait is "about 2 hours. Should I go home or wait?". Conveyed to pt that she was extremely upset with pain approximately an hour ago and it would be best for pt to wait for evaluation. Pt states "ok, I'm sorry". Conveyed to pt that if she leaves the ED, she will lose her place in line for evaluation. Pt verbalized understanding. Loma Sousa, RN BSN

## 2019-09-30 NOTE — ED Triage Notes (Addendum)
Per EMS- Patient c/o vaginal pain and states she took her last radiation treatment on 09/23/19. Patient reports that she received pain meds (Ibuprofen and Oxycodone) Rx from her physician,but they are not working.  Patient states she ran out of Oxycodone last week and can not get a refill until 10/05/19. Patient states she is prescribed this by her PCP for back pain, but has been taking for vaginal pain.

## 2019-09-30 NOTE — ED Notes (Signed)
Discharge paperwork reviewed with pt, including antibiotic education.  Pt asked if she could have an ambulance take her home.  This RN informed pt that she did not qualify for PT[facility] transport home d/t being able to ambulate independently.  Pt verbalized understanding and reported that she would call to find a ride.

## 2019-10-01 ENCOUNTER — Telehealth: Payer: Self-pay | Admitting: Oncology

## 2019-10-01 ENCOUNTER — Telehealth: Payer: Self-pay | Admitting: *Deleted

## 2019-10-01 ENCOUNTER — Telehealth: Payer: Self-pay

## 2019-10-01 NOTE — Telephone Encounter (Signed)
Called Jordan [name] and advised her that Dr. Sondra [name] has sent in a prescription for pain medication to take as needed over the weekend.  She verbalized understanding but said she does not have transportation to the pharmacy. She is going to try to find someone to take her. She also does not have transportation for the appointment with Dr. Sondra [name] on Monday.  Discussed the transportation program and referred her to Jordan [name], Solicitor to be set up for the program.  Jordan [name] said she is feeling better today.  She has been taking ibuprofen which helps a little.

## 2019-10-01 NOTE — Telephone Encounter (Signed)
Returned patient's phone call, lvm for a return call 

## 2019-10-01 NOTE — Telephone Encounter (Signed)
Pt calling to ask if she could use heating pad. Conveyed to pt that this RN was not sure if it would help with pain but that she was allowed to use a heating pad cautiously. Pt verbalized understanding. Loma Sousa, RN BSN

## 2019-10-03 LAB — URINE CULTURE: Culture: >=100000 — AB

## 2019-10-04 ENCOUNTER — Other Ambulatory Visit: Payer: Self-pay

## 2019-10-04 ENCOUNTER — Encounter: Payer: Self-pay | Admitting: Radiation Oncology

## 2019-10-04 ENCOUNTER — Telehealth: Payer: Self-pay | Admitting: Emergency Medicine

## 2019-10-04 ENCOUNTER — Ambulatory Visit
Admission: RE | Admit: 2019-10-04 | Discharge: 2019-10-04 | Disposition: A | Discharge status: Home / Self Care | Payer: Medicaid Other | Source: Ambulatory Visit | Attending: Radiation Oncology | Admitting: Radiation Oncology

## 2019-10-04 ENCOUNTER — Ambulatory Visit: Payer: Medicaid Other | Admitting: Radiation Oncology

## 2019-10-04 VITALS — BP 120/95 | HR 78 | Temp 98.0°F | Resp 20 | Wt 287.2 lb

## 2019-10-04 DIAGNOSIS — C52 Malignant neoplasm of vagina: Secondary | ICD-10-CM | POA: Diagnosis not present

## 2019-10-04 NOTE — Telephone Encounter (Signed)
Post ED Visit - Positive Culture Follow-up  Culture report reviewed by antimicrobial stewardship pharmacist: [facility] Team []  Elenor Quinones, Pharm.D. []  Heide Guile, Pharm.D., BCPS AQ-ID []  Parks Neptune, Pharm.D., BCPS []  Alycia Rossetti, Pharm.D., BCPS []  [place], [place].D., BCPS, AAHIVP []  Legrand Como, Pharm.D., BCPS, AAHIVP []  Salome Arnt, PharmD, BCPS []  Johnnette Gourd, PharmD, BCPS []  Hughes Better, PharmD, BCPS []  Leeroy Cha, PharmD []  Laqueta Linden, PharmD, BCPS []  Albertina Parr, PharmD  [facility] Team []  Leodis Sias, PharmD []  Lindell Spar, PharmD [x]  Royetta Asal, PharmD []  Graylin Shiver, Rph []  Rema Fendt) Glennon Mac, PharmD []  Arlyn Dunning, PharmD []  Netta Cedars, PharmD []  Dia Sitter, PharmD []  Leone Haven, PharmD []  Gretta Arab, PharmD []  Theodis Shove, PharmD []  Peggyann Juba, PharmD []  Reuel Boom, PharmD   Positive urine culture Treated with cephalexin, organism sensitive to the same and no further patient follow-up is required at this time.  Hazle Nordmann 10/04/2019, 2:25 PM

## 2019-10-04 NOTE — Patient Instructions (Signed)
Coronavirus (COVID-19) Are you at risk?  Are you at risk for the Coronavirus (COVID-19)?  To be considered HIGH RISK for Coronavirus (COVID-19), you have to meet the following criteria:  . Traveled to [place], [place], [name], [place] or [place]; or in the [place] to [place], [place], [place], or [place]; and have fever, cough, and shortness of breath within the last 2 weeks of travel OR . Been in close contact with a person diagnosed with COVID-19 within the last 2 weeks and have fever, cough, and shortness of breath . IF YOU DO NOT MEET THESE CRITERIA, YOU ARE CONSIDERED LOW RISK FOR COVID-19.  What to do if you are HIGH RISK for COVID-19?  . If you are having a medical emergency, call 911. . Seek medical care right away. Before you go to a doctor's office, urgent care or emergency department, call ahead and tell them about your recent travel, contact with someone diagnosed with COVID-19, and your symptoms. You should receive instructions from your physician's office regarding next steps of care.  . When you arrive at healthcare provider, tell the healthcare staff immediately you have returned from visiting [place], [place], [place], [place] or [name]; or traveled in the [place] to [place], [place], [place], or [place]; in the last two weeks or you have been in close contact with a person diagnosed with COVID-19 in the last 2 weeks.   . Tell the health care staff about your symptoms: fever, cough and shortness of breath. . After you have been seen by a medical provider, you will be either: o Tested for (COVID-19) and discharged home on quarantine except to seek medical care if symptoms worsen, and asked to  - Stay home and avoid contact with others until you get your results (4-5 days)  - Avoid travel on public transportation if possible (such as bus, train, or airplane) or o Sent to the Emergency Department by EMS for evaluation, COVID-19 testing, and possible  admission depending on your condition and test results.  What to do if you are LOW RISK for COVID-19?  Reduce your risk of any infection by using the same precautions used for avoiding the common cold or flu:  . Wash your hands often with soap and warm water for at least 20 seconds.  If soap and water are not readily available, use an alcohol-based hand sanitizer with at least 60% alcohol.  . If coughing or sneezing, cover your mouth and nose by coughing or sneezing into the elbow areas of your shirt or coat, into a tissue or into your sleeve (not your hands). . Avoid shaking hands with others and consider head nods or verbal greetings only. . Avoid touching your eyes, nose, or mouth with unwashed hands.  . Avoid close contact with people who are sick. . Avoid places or events with large numbers of people in one location, like concerts or sporting events. . Carefully consider travel plans you have or are making. . If you are planning any travel outside or inside the [place], visit the CDC's Travelers' Health webpage for the latest health notices. . If you have some symptoms but not all symptoms, continue to monitor at home and seek medical attention if your symptoms worsen. . If you are having a medical emergency, call 911.   ADDITIONAL HEALTHCARE OPTIONS FOR PATIENTS  [facility] / e-Visit: [email or web address]         [facility]: [phone]  [place]   [facility]: [phone]                   [facility]: [phone]   

## 2019-10-04 NOTE — Progress Notes (Signed)
Pt presents today for a work in appt with Dr. Sondra Come. Pt reports pain in vaginal cuff rated 8/10. Pt denies dysuria/hematuria. Pt denies vaginal bleeding/discharge. Pt denies rectal bleeding, diarrhea/constipation. Pt denies abdominal bloating, N/V.   BP (!) 120/95 (BP Location: Left Arm, Patient Position: Sitting, Cuff Size: Large)   Pulse 78   Temp 98 F (36.7 C)   Resp 20   Wt 287 lb 3.2 oz (130.3 kg)   LMP  (LMP Unknown)   SpO2 98%   BMI 43.67 kg/m   Wt Readings from Last 3 Encounters:  10/04/19 287 lb 3.2 oz (130.3 kg)  09/30/19 250 lb (113.4 kg)  08/24/19 280 lb 8 oz (127.2 kg)   Loma Sousa, RN BSN

## 2019-10-04 NOTE — Progress Notes (Signed)
Counseling Intern spoke to patient via phone for a counseling session on 10/04/19.  Counseling intern provided therapeutic listening, empathy, compassion, and feedback. Pt reported going to the ED the previous week because of vaginal pain and discomfort. Pt reported that she has also been dealing with chronic bladder infections for the past 14 years, but that medication has never helped. Pt stated that learning the pain and discomfort was a part of the healing process was both a relief and frustrating. Pt stated she continues to question weather the pain and effort to treat her cancer was worth it, but she reminds herself that it was worth it in the end because "I want to live". Pt reported she attended a group therapy session last week and that she "liked it". Counseling intern normalized the patient's emotional responses to her cancer treatment and encouraged the pt to continue attending individual and group counseling to work toward her goals. Pt stated one goal is to decrease anxiety (specifically related to being in confined spaces) and hopes to one day take aride on an [employer] train. Future sessions will focus on strategies to cope with anxiety.  Next Counseling Session: Monday, October 11, 2019 at 1:00pm via phone  [name], [facility], [facility] Counseling Intern Voicemail:  [phone]

## 2019-10-04 NOTE — Progress Notes (Signed)
Radiation Oncology         [phone] ________________________________  Name: Jordan [name] MRN: [id number]  Date: 10/04/2019  DOB: Aug 21, 1966  Follow-Up Visit Note  CC: Jordan Huh, NP  Jordan Huh, NP    ICD-10-CM   1. 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  Radiation Treatment Dates: 08/24/2019 through 09/22/2019 Site Technique Total Dose (Gy) Dose per Fx (Gy) Completed Fx Beam Energies  Pelvis: Pelvis_vagina HDR-brachy 30/30 6 5/5 Ir-192    Narrative:  The patient returns today for unscheduled follow-up. Patient called clinic on 09/30/2019 and complained of "needles up in there" (vaginal cuff pain). Per nurse's note, patient stated that Ibuprofen only provided temporary relief and Oxycodone could not be filled for five more days. Patient was told that she could be evaluated in clinic later that afternoon. However, she declined this due to not having means of transportation. Instead, she was seen in the ED by Dr. Grandville Silos. Per his ED note, patient complained of progressively worsening vaginal pain and urinary frequency that was similar to prior UTI. Pelvic exam was unremarkable, patient had no abdominal tenderness, and was well-appearing and non-toxic. Pain was treated with some improvement. UA was consistent with UTI and treated empirically with Keflex. Patient was discharged.  On review of systems, patient reports the pain is better and does come and go. Patient denies hematuria or vaginal bleeding.  She denies any problems with diarrhea or abdominal bloating or rectal bleeding.  ALLERGIES:  is allergic to ciprofloxacin; citalopram; lamotrigine; sulfa antibiotics; and tramadol.  Meds: Current Outpatient Medications  Medication Sig Dispense Refill  . acetaminophen (TYLENOL) 500 MG tablet Take 1-2 tablets (500-1,000 mg total) by mouth every 6 (six) hours as  needed for moderate pain. 30 tablet 1  . cephALEXin (KEFLEX) 250 MG capsule Take 1 capsule (250 mg total) by mouth 4 (four) times daily for 10 days. 40 capsule 0  . ibuprofen (ADVIL) 800 MG tablet Take 1 tablet (800 mg total) by mouth every 8 (eight) hours as needed. 30 tablet 4  . lamoTRIgine (LAMICTAL) 100 MG tablet Take 100 mg by mouth daily.    Marland Kitchen lurasidone (LATUDA) 20 MG TABS tablet Take 20 mg by mouth daily.    . nitrofurantoin, macrocrystal-monohydrate, (MACROBID) 100 MG capsule Take 1 capsule (100 mg total) by mouth 2 (two) times daily. 14 capsule 0  . oxyCODONE (OXY IR/ROXICODONE) 5 MG immediate release tablet Take 1 tablet (5 mg total) by mouth every 4 (four) hours as needed for severe pain or breakthrough pain. 10 tablet 0  . Oxycodone HCl 10 MG TABS Take 10 mg by mouth 2 (two) times daily as needed (pain).     Marland Kitchen oxyCODONE-acetaminophen (PERCOCET) 10-325 MG tablet Take 1 tablet by mouth every 4 (four) hours as needed for up to 1 dose for pain. 1 tablet 0  . senna-docusate (SENOKOT-S) 8.6-50 MG tablet Take 2 tablets by mouth at bedtime. 30 tablet 0  . Vitamin D, Ergocalciferol, (DRISDOL) 1.25 MG (50000 UT) CAPS capsule Take 50,000 Units by mouth once a week.     No current facility-administered medications for this encounter.    Physical Findings: The patient is in no acute distress. Patient is alert and oriented.  weight is 287 lb 3.2 oz (130.3 kg). Her temperature is 98 F (36.7 C). Her blood pressure is 120/95 (abnormal) and her  pulse is 78. Her respiration is 20 and oxygen saturation is 98%. .  No significant changes. Lungs are clear to auscultation bilaterally. Heart has regular rate and rhythm. No palpable cervical, supraclavicular, or axillary adenopathy. Abdomen soft, non-tender, normal bowel sounds.  On pelvic examination the external genitalia were unremarkable. A speculum exam was performed. There are no mucosal lesions noted in the vaginal vault.  Mild erythema to the upper  vaginal mucosa consistent with radiation effect.  No signs of infection in the vaginal vault, no whitish discharge to suggest yeast infection. bimanual examination vaginal cuff/reconstructed vagina is intact.  Mild tenderness with palpation in the upper vaginal region.  Lab Findings: Lab Results  Component Value Date   WBC 6.7 07/23/2019   HGB 13.0 07/23/2019   HCT 40.0 07/23/2019   MCV 94.3 07/23/2019   PLT 158 07/23/2019    Radiographic Findings: No results found.  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  Patient has experienced discomfort in the upper vaginal area likely related to her recent radiation therapy completion.  To continue on pain medication as needed in the near future.  Plan: The patient will keep her already scheduled routine follow-up in January.  Is too early to initiate use of vaginal dilator particularly given the patient's symptoms.  ____________________________________   Blair Promise, PhD, MD  This document serves as a record of services personally performed by Gery Pray, MD. It was created on his behalf by Clerance Lav, a trained medical scribe. The creation of this record is based on the scribe's personal observations and the provider's statements to them. This document has been checked and approved by the attending provider.

## 2019-10-04 NOTE — Progress Notes (Incomplete)
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 Patient reported relief of irritation/pain throughout treatment. Patient did have a UA with cultures on 08/30/2019, which was significant for UTI. Patient was prescribed antibiotics.  Plan: The patient will follow-up with radiation oncology in one month.  ________________________________________________   Blair Promise, PhD, MD  This document serves as a record of services personally performed by Gery Pray, MD. It was created on his behalf by Clerance Lav, a trained medical scribe. The creation of this record is based on the scribe's personal observations and the provider's statements to them. This document has been checked and approved by the attending provider.

## 2019-10-05 ENCOUNTER — Telehealth: Payer: Self-pay

## 2019-10-05 NOTE — Telephone Encounter (Signed)
Dr Sondra Come had prescribe 5mg  oxycodone to get patient through until her regular Rx of 10 mg was due to be filled. Patient did not pick up the 5mg  and now wanted to pick up both. Had our rx D/C'd as patient did not need to have extra.

## 2019-10-06 ENCOUNTER — Telehealth: Payer: Self-pay | Admitting: Oncology

## 2019-10-06 NOTE — Telephone Encounter (Signed)
Jordan [name] called and asked if she should still take the Keflex that she was given in the ED.  She said her PCP did another urinalysis/culture yesterday.  Advised her to call her PCP and ask if she should start taking Keflex.  She also said that she has been having urinary frequency since finishing radiation and wants to know if it is a side effect of radiation.  Advised her that it could be from radiation and also from the UTI.  She verbalized understanding.

## 2019-10-07 ENCOUNTER — Ambulatory Visit: Payer: Medicaid Other | Admitting: Radiation Oncology

## 2019-10-14 ENCOUNTER — Ambulatory Visit: Payer: Medicaid Other | Admitting: Radiation Oncology

## 2019-10-25 ENCOUNTER — Other Ambulatory Visit: Payer: Self-pay

## 2019-10-25 ENCOUNTER — Encounter: Payer: Self-pay | Admitting: Radiation Oncology

## 2019-10-25 ENCOUNTER — Ambulatory Visit
Admission: RE | Admit: 2019-10-25 | Discharge: 2019-10-25 | Disposition: A | Discharge status: Home / Self Care | Payer: Medicaid Other | Source: Ambulatory Visit | Attending: Radiation Oncology | Admitting: Radiation Oncology

## 2019-10-25 VITALS — BP 117/65 | HR 80 | Temp 98.2°F | Resp 20 | Wt 290.4 lb

## 2019-10-25 DIAGNOSIS — Z79899 Other long term (current) drug therapy: Secondary | ICD-10-CM | POA: Diagnosis not present

## 2019-10-25 DIAGNOSIS — Z8589 Personal history of malignant neoplasm of other organs and systems: Secondary | ICD-10-CM | POA: Diagnosis present

## 2019-10-25 DIAGNOSIS — Z923 Personal history of irradiation: Secondary | ICD-10-CM | POA: Diagnosis not present

## 2019-10-25 DIAGNOSIS — C52 Malignant neoplasm of vagina: Secondary | ICD-10-CM

## 2019-10-25 NOTE — Progress Notes (Signed)
Radiation Oncology         [phone] ________________________________  Name: Jordan [name] MRN: [id number]  Date: 10/25/2019  DOB: 07/21/1966  Follow-Up Visit Note  CC: [name], NP  [name], NP  No diagnosis found.  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  Radiation Treatment Dates: 08/24/2019 through 09/22/2019 Site Technique Total Dose (Gy) Dose per Fx (Gy) Completed Fx Beam Energies  Pelvis: Pelvis_vagina HDR-brachy 30/30 6 5/5 Ir-192    Narrative:  The patient returns today routine follow-up. She was seen for an unscheduled follow-up on 10/04/2019, during which time she complained of vaginal pain and was being treated for a UTI. No significant interval history since that visit.   On review of systems, patient reports no further pain in the pelvis area. Patient denies vaginal bleeding but does report a "bloody Smell '' in the lower pelvis area and requesting exam  ALLERGIES:  is allergic to ciprofloxacin; citalopram; lamotrigine; sulfa antibiotics; and tramadol.  Meds: Current Outpatient Medications  Medication Sig Dispense Refill  . acetaminophen (TYLENOL) 500 MG tablet Take 1-2 tablets (500-1,000 mg total) by mouth every 6 (six) hours as needed for moderate pain. 30 tablet 1  . ibuprofen (ADVIL) 800 MG tablet Take 1 tablet (800 mg total) by mouth every 8 (eight) hours as needed. 30 tablet 4  . lamoTRIgine (LAMICTAL) 100 MG tablet Take 100 mg by mouth daily.    Marland Kitchen lurasidone (LATUDA) 20 MG TABS tablet Take 20 mg by mouth daily.    . nitrofurantoin, macrocrystal-monohydrate, (MACROBID) 100 MG capsule Take 1 capsule (100 mg total) by mouth 2 (two) times daily. 14 capsule 0  . oxyCODONE (OXY IR/ROXICODONE) 5 MG immediate release tablet Take 1 tablet (5 mg total) by mouth every 4 (four) hours as needed for severe pain or breakthrough pain. 10 tablet 0  . Oxycodone HCl 10 MG TABS  Take 10 mg by mouth 2 (two) times daily as needed (pain).     Marland Kitchen oxyCODONE-acetaminophen (PERCOCET) 10-325 MG tablet Take 1 tablet by mouth every 4 (four) hours as needed for up to 1 dose for pain. 1 tablet 0  . senna-docusate (SENOKOT-S) 8.6-50 MG tablet Take 2 tablets by mouth at bedtime. 30 tablet 0  . Vitamin D, Ergocalciferol, (DRISDOL) 1.25 MG (50000 UT) CAPS capsule Take 50,000 Units by mouth once a week.     No current facility-administered medications for this encounter.    Physical Findings: The patient is in no acute distress. Patient is alert and oriented.  vitals were not taken for this visit. .  No significant changes. Lungs are clear to auscultation bilaterally. Heart has regular rate and rhythm. No palpable cervical, supraclavicular, or axillary adenopathy. Abdomen soft, non-tender, normal bowel sounds.  On pelvic examination the external genitalia were unremarkable. A speculum exam was performed. There are no mucosal lesions noted in the vaginal vault. On bimanual examination there were no pelvic masses appreciated.  Upper vaginal surgical bed well-healed.  She continues to have some tenderness with palpation in the upper vaginal area  Lab Findings: Lab Results  Component Value Date   WBC 6.7 07/23/2019   HGB 13.0 07/23/2019   HCT 40.0 07/23/2019   MCV 94.3 07/23/2019   PLT 158 07/23/2019    Radiographic Findings: No results found.  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  No signs of recurrence on exam today  Plan: Patient will follow-up with radiation oncology in three months with [name] and radiation oncology in 6 months.  Today the patient was given a vaginal dilator and instructions on its use.  ____________________________________   Blair Promise, PhD, MD  This document serves as a record of services personally performed by Gery Pray, MD. It was created on  his behalf by Clerance Lav, a trained medical scribe. The creation of this record is based on the scribe's personal observations and the provider's statements to them. This document has been checked and approved by the attending provider.

## 2019-10-25 NOTE — Patient Instructions (Signed)
Home Care Instructions for the Insertion and Care of Your Vaginal Dilator  Why Do I Need a Vaginal Dilator?  Internal radiation therapy may cause scar tissue to form at the top of your vagina (vaginal cuff).  This may make vaginal examinations difficult in the future. You can prevent scar tissue from forming by using a vaginal dilator (a smooth plastic rod), and/or by having regular sexual intercourse.  If not using the dilator you should be having intercourse two or three times a week.  If you are unable to have intercourse, you should use your vaginal dilator.  You may have some spotting or bleeding from your dilator or intercourse the first few times. You may also have some discomfort. If discomfort occurs with intercourse, you and your partner may need to stop for a while and try again later.  How to Use Your Vaginal Dilator  - Wash the dilator with soap and water before and after each use. - Check the dilator to be sure it is smooth. Do not use the dilator if you find any roughspots. - Coat the dilator with K-Y Jelly, Astroglide, or Replens. Do not use Vaseline, baby oil, or other oil based lubricants. They are not water-soluble and can be irritating to the tissues in the vagina. - Lie on your back with your knees bent and legs apart. - Insert the rounded end of the dilator into your vagina as far as it will go without causing pain or discomfort. - Close your knees and slowly straighten your legs. - Keep the dilator in your vagina for about 10 to 15 minutes.  Please use 3 times a week, for example: Monday, Wednesday and Friday evenings. - Bend your knees, open your legs, and gently remove the dilator. - Gently cleanse the skin around the vaginal opening. - Wash the dilator after each use. -  It is important that you use the dilator routinely until instructed otherwise by your doctor.   Coronavirus (COVID-19) Are you at risk?  Are you at risk for the Coronavirus  (COVID-19)?  To be considered HIGH RISK for Coronavirus (COVID-19), you have to meet the following criteria:  . Traveled to [place], [place], [name], [place] or [place]; or in the [place] to [place], [place], [place], or [place]; and have fever, cough, and shortness of breath within the last 2 weeks of travel OR . Been in close contact with a person diagnosed with COVID-19 within the last 2 weeks and have fever, cough, and shortness of breath . IF YOU DO NOT MEET THESE CRITERIA, YOU ARE CONSIDERED LOW RISK FOR COVID-19.  What to do if you are HIGH RISK for COVID-19?  . If you are having a medical emergency, call 911. . Seek medical care right away. Before you go to a doctor's office, urgent care or emergency department, call ahead and tell them about your recent travel, contact with someone diagnosed with COVID-19, and your symptoms. You should receive instructions from your physician's office regarding next steps of care.  . When you arrive at healthcare provider, tell the healthcare staff immediately you have returned from visiting [place], [place], [place], [place] or [name]; or traveled in the [place] to [place], [place], [place], or [place]; in the last two weeks or you have been in close contact with a person diagnosed with COVID-19 in the last 2 weeks.   . Tell the health care staff about your symptoms: fever, cough and shortness of breath. .   After you have been seen by a medical provider, you will be either: o Tested for (COVID-19) and discharged home on quarantine except to seek medical care if symptoms worsen, and asked to  - Stay home and avoid contact with others until you get your results (4-5 days)  - Avoid travel on public transportation if possible (such as bus, train, or airplane) or o Sent to the Emergency Department by EMS for evaluation, COVID-19 testing, and possible admission depending on your condition and test results.  What to do if you are LOW  RISK for COVID-19?  Reduce your risk of any infection by using the same precautions used for avoiding the common cold or flu:  . Wash your hands often with soap and warm water for at least 20 seconds.  If soap and water are not readily available, use an alcohol-based hand sanitizer with at least 60% alcohol.  . If coughing or sneezing, cover your mouth and nose by coughing or sneezing into the elbow areas of your shirt or coat, into a tissue or into your sleeve (not your hands). . Avoid shaking hands with others and consider head nods or verbal greetings only. . Avoid touching your eyes, nose, or mouth with unwashed hands.  . Avoid close contact with people who are sick. . Avoid places or events with large numbers of people in one location, like concerts or sporting events. . Carefully consider travel plans you have or are making. . If you are planning any travel outside or inside the [place], visit the CDC's Travelers' Health webpage for the latest health notices. . If you have some symptoms but not all symptoms, continue to monitor at home and seek medical attention if your symptoms worsen. . If you are having a medical emergency, call 911.   ADDITIONAL HEALTHCARE OPTIONS FOR PATIENTS  [facility] / e-Visit: [email or web address]         [facility]: [phone]  [facility]: [phone]                   [facility]: [phone]   

## 2019-10-25 NOTE — Progress Notes (Signed)
Pt presents today for one month f/u with Dr. Sondra Come. Pt is aggressive, wanting a pelvic exam due to a "bloody smell down there". Conveyed to pt that this appt is for dilator teaching and pt needs to allow her tissue to heal after radiation. Attempted to convey to pt that the next pelvic exam will be with [name] in 2 months. Pt denies dysuria/hematuria. Pt denies vaginal bleeding/discharge. Pt denies rectal bleeding, diarrhea/constipation. Pt denies abdominal bloating, N/V.  BP 117/65 (BP Location: Right Arm, Patient Position: Sitting, Cuff Size: Large)   Pulse 80   Temp 98.2 F (36.8 C)   Resp 20   Wt 290 lb 6.4 oz (131.7 kg)   LMP  (LMP Unknown)   SpO2 97%   BMI 44.16 kg/m    Wt Readings from Last 3 Encounters:  10/25/19 290 lb 6.4 oz (131.7 kg)  10/04/19 287 lb 3.2 oz (130.3 kg)  09/30/19 250 lb (113.4 kg)    Home Care Instructions for the Insertion and Care of Your Vaginal Dilator  Why Do I Need a Vaginal Dilator?  Internal radiation therapy may cause scar tissue to form at the top of your vagina (vaginal cuff).  This may make vaginal examinations difficult in the future. You can prevent scar tissue from forming by using a vaginal dilator (a smooth plastic rod), and/or by having regular sexual intercourse.  If not using the dilator you should be having intercourse two or three times a week.  If you are unable to have intercourse, you should use your vaginal dilator.  You may have some spotting or bleeding from your dilator or intercourse the first few times. You may also have some discomfort. If discomfort occurs with intercourse, you and your partner may need to stop for a while and try again later.  How to Use Your Vaginal Dilator  - Wash the dilator with soap and water before and after each use. - Check the dilator to be sure it is smooth. Do not use the dilator if you find any roughspots. - Coat the dilator with K-Y Jelly, Astroglide, or Replens. Do not use  Vaseline, baby oil, or other oil based lubricants. They are not water-soluble and can be irritating to the tissues in the vagina. - Lie on your back with your knees bent and legs apart. - Insert the rounded end of the dilator into your vagina as far as it will go without causing pain or discomfort. - Close your knees and slowly straighten your legs. - Keep the dilator in your vagina for about 10 to 15 minutes.  Please use 3 times a week, for example: Monday, Wednesday and Friday evenings. [facility] your knees, open your legs, and gently remove the dilator. - Gently cleanse the skin around the vaginal opening. - Wash the dilator after each use. -  It is important that you use the dilator routinely until instructed otherwise by your doctor.   Loma Sousa, RN BSN

## 2019-10-26 ENCOUNTER — Telehealth: Payer: Self-pay | Admitting: *Deleted

## 2019-10-26 NOTE — Progress Notes (Signed)
Spoke to patient on 10/26/19 for a counseling appointment via phone. Counseling Intern provided space for pt to reflect on her cancer experience and on her current challenges and responded with empathy and compassion. 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 Pt stated that although she is still distressed by not seeing her boyfriend, she remains hopeful about the future of the Covid-19 pandemic. She reported feeling angry and frustrated by some people's disregard of the Covid-19 safety guidelines and restrictions. Pt reported she was alone for the holidays and that she accepted this on Christmas and remained calm, but she reported that she felt angry and sad on New Years Eve because she was not able to see her boyfriend. Counseling intern normalized patient's feelings and responses. Pt reported confusion about the re-application process for food stamps and medicaid, but stated she will call the state for clarification. Counseling intern plans to work with client on strategies for distress tolerance during the next session.   Next Counseling Appt: 11/02/19 at 2:00pm via phone  [name] [facility] Counseling Intern Voicemail:  [phone]

## 2019-10-26 NOTE — Telephone Encounter (Signed)
CALLED PATIENT TO ASK ABOUT ARRANGING FU FOR July 2021, PATIENT AGREED TO July 1 @ 10:15 AM, MAILED APPT. CARD

## 2019-10-28 ENCOUNTER — Ambulatory Visit: Payer: Self-pay | Admitting: Radiation Oncology

## 2019-11-08 NOTE — Progress Notes (Signed)
Spoke to patient on 11/08/19 for a telehealth counseling appointment via phone. Counseling intern provided therapeutic listening, emotional support, and normalization of feelings. Pt reported that she is relieved about finally re-certifying her food stamps. However, pt stated she remains frustrated by the difficulty of the re-certification process. Pt and counseling intern discussed strategies of letting go of frustration. Additionally, pt stated she has still not been able to talk with her ex-husband, but continues to believe that it is important that she has the opportunity to tell him how she feels. Pt and counseling intern discussed again the importance of her ability to have low expectations about how he might react and that she might consider the fact that she will never get to speak with him directly about this. Pt stated she is not sleeping well and reported having nightmares. Pt declined to provide details, but said that she tosses and turns during the night because of intrusive thoughts. Pt stated she had to go, so counseling intern will introduce thought restructuring during the next session as a strategy for when the pt notices these intrusive thoughts.  Next Counseling Appt: Monday, November 15, 2019 at 1:00pm  [name] [facility] Counseling Intern Voicemail:  [phone]

## 2019-11-15 NOTE — Progress Notes (Signed)
Spoke to patient on 11/15/19 for a telehealth counseling session via phone. Pt appeared agitated, but reported that speaking to counselors helps her "mellow out". 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 Pt and counselor discussed strategies to decrease anxiety when pt is exposed to heights such as taking slow deep breaths, grounding techniques, and supportive self-talk. Pt agreed that these strategies help and will continue to implement them as needed. Additionally, pt reported feeling angry about not having the opportunity to speak her mind to her "ex". Counselor and pt discussed how letting go of this desire or need for closure will allow her to take control of her reaction to the situation. Pt agreed to work on letting go by reminding herself she can let it go or by imagine closing a book or a door. Counseling intern noted the pt often voices her thought that "people treat me like I am stupid". Pt states she does not think this about herself, but knows she needs extra support at times. Pt stated she appreciates when people take the time to help and support her.    Next Counseling Session: Thursday, November 18, 2019 at noon via phone  [place] Counseling Intern Voicemail:  [phone]

## 2019-11-18 ENCOUNTER — Telehealth: Payer: Self-pay

## 2019-11-18 ENCOUNTER — Telehealth: Payer: Self-pay | Admitting: Oncology

## 2019-11-18 NOTE — Telephone Encounter (Signed)
Arnie called with questions on how to use the vaginal dilator.  She has not started using it yet and is afraid to insert it.  Discussed why she needs to use the dilator and to start with the smallest size first.  Went over instructions for use and that she does not need to insert it the full length.  She said she will start using it three times a week for 10 minutes at a time.   She also said she is losing pubic hair and is wondering if this was from radiation.  Advised her that patients do not usually lose hair with brachytherapy and that it should grow back.

## 2019-11-18 NOTE — Telephone Encounter (Signed)
Mailed a calendar with Dr. Serita Grit and Dr. Clabe Seal appointment date and time with address and phone numbers for each office as she needs this for her transportation as requested by Ms [facility]

## 2019-11-29 NOTE — Progress Notes (Signed)
Spoke to patient via phone on 11/29/19 for a scheduled counseling session. Counseling intern provided therapeutic listening, empathy, compassion, and normalization of feelings. Pt stated she is doing well, but still worries about her boyfriend's quality of care in the nursing home. Pt stated she is hopeful that the pandemic will end later this year and she looks forward to seeing her boyfriend again in person at that point. Pt reported that she will continue trying the yoga breathing exercise we discussed in the previous session, which aim to help her feel calm and relaxed. Pt stated she is going to group counseling and medication management later this week.   Next Counseling Session: Monday, December 06, 2019 at 1:00pm  [name] [facility] Counseling Intern Voicemail:  [phone]

## 2019-12-02 ENCOUNTER — Telehealth: Payer: Self-pay | Admitting: Oncology

## 2019-12-02 DIAGNOSIS — C52 Malignant neoplasm of vagina: Secondary | ICD-10-CM

## 2019-12-02 MED ORDER — IBUPROFEN 800 MG PO TABS: 800.0000 mg | ORAL_TABLET | Freq: Three times a day (TID) | ORAL | 0 refills | Status: DC | PRN

## 2019-12-02 NOTE — Telephone Encounter (Signed)
Called in another refill of ibuprofen to [facility] per Joylene John, NP.  Patient now has 2 refills available until she sees [name] in April.  Called [place] back and notified her of refill. She verbalized understanding and agreement.

## 2019-12-02 NOTE — Telephone Encounter (Signed)
Jordan [name] called asking for a refill on Ibuprofen 800 mg.  She said she is having aching/throbbing pain like she had after surgery so she is taking the ibuprofen twice a day, sometimes with oxycodone.  She is concerned about being able to get a refill of ibuprofen since she will not be seeing [name] until April.  She said she just picked up a refill and thinks she has one more available at the [facility].  [place] and she did refill the ibuprofen yesterday and has one more refill available.

## 2019-12-06 NOTE — Progress Notes (Signed)
Counseling intern spoke to patient on 12/06/19 via phone for a counseling session. CI provided therapeutic listening, empathy, and normalization of feelings. Pt reported not feeling well with cold-like symptoms, but denied SOB. Pt stated she had a difficult weekend because it was her 15th anniversary with her boyfriend, but she could not be with him because of Covid. She stated he still is residing in a nursing home and Covid-19 restrictions do not allow visitors. CI reflected that the pt felt sad and angry. Pt stated that she "tries to stay positive" in her thinking in the face of challenges and prays to God for strength. CI reminded pt to keep practicing her deep breathing exercise. Pt and CI decided to reschedule for later this week because pt was losing her voice.   Next Counseling Session: Friday, December 10, 2019 at 3:00pm via phone  [name] [facility] Counseling Intern Voicemail:  [phone]

## 2019-12-07 ENCOUNTER — Telehealth: Payer: Self-pay | Admitting: *Deleted

## 2019-12-07 NOTE — Telephone Encounter (Signed)
Patient called and stated "Dr Denman George said I don't need to go to the regular GYN anymore since nothing is left. But I called them last week about going through menopause. They said I need to talk to Dr Denman George. I have been going through this for ahile. I'm hot, I'm cold" Explained that I would talk with Dr Denman George and call her tomorrow. Patient is aware of her appt on 4/5.

## 2019-12-08 NOTE — Telephone Encounter (Signed)
LM for Ms Depies to call back to Dr. Serita Grit office on Friday 12-10-19 to discuss Dr. Serita Grit recommendations regarding her menopausal symptoms as the office may be closed tomorrow due to the inclement weather.

## 2019-12-09 NOTE — Telephone Encounter (Signed)
Told [name] that [name] does not recommend HRT due to her age. If her symptoms are too severe to [facility] could use a hormone patch but it it increases her risk of of stroke, heart attack,breast cancer if it is used for more than 5 years which is why it is not automatically prescribed. Told [name] that she can discuss this moore at her visit with [name] on 01-24-20. Pt verbalized understanding.

## 2019-12-10 NOTE — Progress Notes (Signed)
Counseling intern spoke to patient on 12/10/19 for counseling session via phone. Counseling Intern provided therapeutic listening, empathy, and normalization of feelings. Pt stated she is feeling physically better this week and saw her PCP for her cold. She stated she had to do a Covid-19 test, which she dislikes, but that she understands it is to be safe. Pt is contemplating if she will get the covid-19 vaccination, but plans to discuss it with her doctor in April. Pt stated she is worried about her boyfriend, who she said is suffering with Pneumonia. Pt reported that she continues to rely on God for strength and comfort and sees positive despite all the negative because of her faith. Pt reported that she is trying the breathing exercises and that they help her feel calm and relaxed.   [name] [place] Counseling Intern Voicemail:  [phone]

## 2019-12-17 NOTE — Progress Notes (Signed)
Spoke to pt on 2021 for a telehealth counseling session via phone. Counseling intern provided space for pt to open up about experiences and responded with empathy, compassion, and normalization of feelings. Pt was tearful reported worrying about her boyfriend because of his declining health. Pt feels angry with him for not focusing on his health throughout his life and for not fighting harder to get better. Pt stated she knows she is strong and is a survivor so she wishes she could take on her boyfriend's health problems and suffering. Pt also expressed sadness about her boyfriend's recent lack of openness about his emotional experience. However, pt does not feel like she can push him to open up because he is "dealing with so much already". Pt stated that her boyfriend was there for her when she had physical and mental health challenges in the past and now she wants to be there for him. Pt continues to look forward to the day she can visit her boyfriend in person in the nursing home. Pt reported she takes it day by day and that her faith in God gives her strength and comfort. Additionally, pt reported being shocked yesterday by her daughter's admission that she does not believe in God. Pt reported she wouldn't have gotten through her challenges in life without her faith and wants her daughter to have the same support. CI and pt discussed how it is hard when our children develop their own beliefs and ideas, but that we love them despite our differences.  Next Counseling Session: Monday, December 20, 2019 at 1:00 via phone  [place] Counseling Intern Voicemail:  [phone]

## 2019-12-20 NOTE — Progress Notes (Signed)
Counseling intern spoke to patient on 3/1/ 2021 for a telehealth counseling session via phone. CI provided space for pt to open up about experiences and responded with empathy, compassion, and normalization of feelings. Pt reported doing "OK" but said she is anxious about several situations. First, pt reported complications and frustration with getting her electric bill paid. Pt and CI discussed automatic thoughts and how we can work on recognizing these thoughts and find ways to intervene and improve our emotional response. CI and pt discussed pros and cons of waiting until Friday to call the electric company back to see if the issue has resolved by then. Pt also reported worrying about her partner but expressed gratitude that she was able to talk with him over the weekend. Pt reported feeling less anxious after our conversation. Pt asked to schedule a follow-up appointment for the following week.  Next Counseling Session: Monday, December 27, 2019 at 1:00pm via phone  Verlan Friends. [place] Counseling Intern Voicemail: [phone]

## 2019-12-27 NOTE — Progress Notes (Signed)
Spoke to pt on 2021 for a telehealth counseling session via phone. Counseling intern provided space for pt to open up about experiences and responded with empathy, compassion, and normalization of feelings. Pt reported feeling angry about the neglect she recalls as a child. She stated that if her parents had treated her better she would have made different decisions, but she reflected that this was not her fault. Pt and CI did an exercise to think about what the pt would say in her own eulogy and pt was able to identify many strengths and traits that she would include: her internal strength, her sense of self-worth, a problem solver, has a growth mindset, is  resilient, powerful, patient, determined,loyal, loving, and devout. Pt continues to grieve the separation from her life partner, but remains hopeful that someday soon the Covid-19 restrictions at nursing homes will change. We also discussed the pt's ability to recognize when she needs support and her appreciation of people in her life that have supported her. CI plans to continue to help the pt process her feelings about her family and work toward acceptance.   Next Counseling Appt: Thursday, December 30, 2019 at noon via phone  [place] Counseling Intern Voicemail:  [phone]

## 2020-01-07 NOTE — Progress Notes (Signed)
Spoke to pt on 01/07/2020 for a telehealth counseling session via phone. Counseling intern provided space for pt to open up about experiences and responded with empathy, compassion, and normalization of feelings.Pt presented as agitated.   Pt reported having a difficult week because of difficulty with a family member and discussed her decision not to communicate with her family of origin because she feels a decreased sense of self-worth and self-confidence when she interacts with them. Pt stated she considers her daughter and life partner her only "real family". Pt recognized that her compulion to fixate on the social media messages from a family member increased her distress. Pt and CI discussed how hard it is to forgive someone who has hurt [name] deeply even after decades have passed and how we can forgive someone we dislike. We also discussed ways to handle seeing upsetting social media posts in the future. Additionally, pt reported she is now allowed to visit the nursing home where her life partner lives, but they have decided not to see one another face-to-face for now until they feel Covid-19 is not a major threat to his health.   CI provided psychoeduction to help pt understand that she can't expect to have control other people's thuoghts, feelings, or behavior. Pt voiced understanding that she can only control her own reaction to other people's thoughts, feelings, and behaviors.   Next Counseling Session: Monday, January 10, 2020 at 3:00pm  [name] [facility] Counseling Intern Voicemail:  [phone]

## 2020-01-10 NOTE — Progress Notes (Signed)
Counseling intern spoke to pt on 01/10/2020 for a telehealth counseling session via phone. Counseling intern(CI) provided space for pt to open up about experiences and responded with empathy, compassion, and normalization of feelings. Pt presented to counseling with a   Pt reported feeling concerned by an upcoming sleep study because she was given little notice of this overnight test. Pt was conflicted about weather or not to go through with the sleep test at this time. Pt expressed being conflicted because she is concerned about her partner's comfort and well being at this time and wasn't sure if she would feel OK being out of pocket for this time during the test. Pt also reported feeling dizzy and light headed and was not sure if it was related to her stress, her UTI, or her problems sleeping. CI suggested she report the symptoms to her doctor, but also pointed out that the sleep test could possibly shed some light on these symptoms.    CI helped the pt use decision-making skills which integrate how you feel about various options to help the pt focus on making a decision that feels right to her.   Next Counseling Session: Monday, January 17, 2020 at 1:00pm via phone  [name] [facility] Counseling Intern Voicemail:  [phone]

## 2020-01-17 NOTE — Progress Notes (Signed)
Spoke to pt on 2021 for a telehealth counseling session via phone. Counseling intern provided space for pt to open up about experiences and responded with empathy, compassion, and normalization of feelings.   Pt reported she had her first Covid-19 vaccination over the weekend and had minimal side effects. She also reported she was able to attend a doctor appointment with her partner for the first time in over 6 months. She said it "felt good" to be able to hug him and she will plan to see him again once she is fully vaccinated. Pt stated she is considering getting a washer/dryer with her stimulus money, but also said she enjoys going to the laundromat because it gets her out of the house. Pt also reported she chose to go ahead with her sleep apnea appt last week, but did not have any information to share.   CI helped pt process emotions related to recent experiences. 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 Pt reported that she is disappointed and CI agreed that involuntary termination is difficult, but CI reminded her that she still has her counselor at [place] and that we will continue to meet weekly for the next month. Pt and CI decided to have a short session today because the pt is fatigued, possibly related to her Covid-19 vaccination. During follow-up sessions, CI will help pt determine if she needs a referral for counseling services after CI has departed.   [name] [place] Counseling Intern Voicemail:  [phone]

## 2020-01-24 ENCOUNTER — Inpatient Hospital Stay: Payer: Medicaid Other | Attending: Gynecologic Oncology | Admitting: Gynecologic Oncology

## 2020-01-24 ENCOUNTER — Encounter: Payer: Self-pay | Admitting: Gynecologic Oncology

## 2020-01-24 ENCOUNTER — Other Ambulatory Visit: Payer: Self-pay

## 2020-01-24 VITALS — BP 143/74 | HR 80 | Temp 98.3°F | Resp 18 | Ht 68.0 in | Wt 317.2 lb

## 2020-01-24 DIAGNOSIS — C52 Malignant neoplasm of vagina: Secondary | ICD-10-CM | POA: Diagnosis not present

## 2020-01-24 DIAGNOSIS — F319 Bipolar disorder, unspecified: Secondary | ICD-10-CM | POA: Insufficient documentation

## 2020-01-24 DIAGNOSIS — E8940 Asymptomatic postprocedural ovarian failure: Secondary | ICD-10-CM | POA: Diagnosis not present

## 2020-01-24 DIAGNOSIS — C519 Malignant neoplasm of vulva, unspecified: Secondary | ICD-10-CM | POA: Insufficient documentation

## 2020-01-24 MED ORDER — IBUPROFEN 800 MG PO TABS: 800.0000 mg | ORAL_TABLET | Freq: Three times a day (TID) | ORAL | 0 refills | Status: DC | PRN

## 2020-01-24 MED ORDER — ESTRADIOL 0.05 MG/24HR TD PTWK: 0.0500 mg | MEDICATED_PATCH | TRANSDERMAL | 12 refills | Status: DC

## 2020-01-24 NOTE — Progress Notes (Signed)
Follow-up Note: Gyn-Onc  Consult was requested by Dr. Rip Harbour for the evaluation of Jordan [name] 54 y.o. female  CC:  Chief Complaint  Patient presents with  . Vaginal Cancer    follow-up    Assessment/Plan:  [name]  is a 55 y.o.  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 S/p adjuvant vaginal brachytherapy for positive margins on specimen. Treatment completed December, 2021.  She will return to see me in 6 months and Dr Sondra Come in 3 months. We will continue 3 monthly surveillance for 2 years.  I recommend pap testing of the vagina annually in October.   HPI: [name] is a 54 year old P1 who is seen in consultation at the request of Dr Rip Harbour for squamous cell carcinoma of the vulva.  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  A follow-up Pap smear in 2018 showed H SIL.  She then underwent a vaginal hysterectomy which confirmed residual CIN-3 with negative margins at the hysterectomy specimen.  She underwent a Pap test for surveillance in April 21, 2019 which revealed the AIN 2-3, H SIL.  Subsequently a vaginal colposcopy was performed on May 28, 2019.  No visible lesions were seen.  There was minimal acetowhite changes noted after acetic acid was applied that was not noted which location.  Biopsies were obtained from the vagina.  Final pathology revealed poorly differentiated squamous cell carcinoma.  The tumor cells were positive for CK 5 6, p63, and P 16.  The patient has a history of bipolar disorder.  She has been treated with medical therapies for this.  She has a history of obesity with a BMI of 41 kg/m.  The patient is a chronic tobacco user.  She has a history of PICC line infection with sepsis due to gram-negative bacteria.  She has had one prior vaginal delivery.  She is currently not employed.  On June 18, 2019 she underwent  staging CT scan of the chest abdomen and pelvis.  This revealed that she was status post hysterectomy with no evidence of metastatic disease in the abdomen or pelvis or chest.  There was a 6 mm right lower lobe that was grossly unchanged from a 2017 CT scan felt to be benign.  A compression fracture was seen at L2 unchanged from prior radiographs.  An MRI of the pelvis with IV contrast was performed on June 26, 2019.  It revealed susceptibility artifact in the vaginal cuff from suture material limiting evaluation.  However no focal soft tissue masses were seen involving the vaginal cuff and no abnormal soft tissue densities in the parametrial regions.  The right and left ovaries were grossly normal.  On July 22, 2019 she underwent a robotic assisted radical upper vaginectomy with bilateral pelvic sentinel lymph node biopsy, right salpingo-oophorectomy, left salpingectomy.  Intraoperative findings were significant for no grossly visible vaginal lesions.  The ovaries and fallopian tubes were adherent to the rectum and vaginal cuff in the obliterated cul-de-sac.  The ovaries themselves are grossly normal-appearing but densely adherent with no suspicious lymph nodes.  There is clinical stage I disease, microscopic.  Postoperatively final pathology returned as FIGO stage Ia microscopic poorly differentiated squamous cell carcinoma.  The tumor site was the posterior vaginal wall and measured 0.6 cm.  The posterior margin (the posterior vaginal cuff margin) was broadly positive for tumor cells.  The deep margin was negative.  There was 2.5 mm depth of invasion.  All sentinel lymph nodes were negative for metastases, as was the left fallopian tube and right tube and ovary.  Due to positive margin status she was recommended adjuvant vaginal radiation postoperatively.  Interval Hx:  She completed adjuvant therapy with vaginal brachytherapy: Radiation Treatment Dates: 08/24/2019 through 09/22/2019 Site  Technique Total Dose (Gy) Dose per Fx (Gy) Completed Fx Beam Energies  Pelvis: Pelvis_vagina HDR-brachy 30/30 6 5/5 Ir-192   Since completing therapy she has done well with no specific complaints other than hot flashes.    Current Meds:  Outpatient Encounter Medications as of 01/24/2020  Medication Sig  . acetaminophen (TYLENOL) 500 MG tablet Take 1-2 tablets (500-1,000 mg total) by mouth every 6 (six) hours as needed for moderate pain.  Marland Kitchen ibuprofen (ADVIL) 800 MG tablet Take 1 tablet (800 mg total) by mouth every 8 (eight) hours as needed.  . lamoTRIgine (LAMICTAL) 100 MG tablet Take 100 mg by mouth daily.  Marland Kitchen lurasidone (LATUDA) 20 MG TABS tablet Take 20 mg by mouth daily.  . nitrofurantoin, macrocrystal-monohydrate, (MACROBID) 100 MG capsule Take 1 capsule (100 mg total) by mouth 2 (two) times daily.  Marland Kitchen oxyCODONE (OXY IR/ROXICODONE) 5 MG immediate release tablet Take 1 tablet (5 mg total) by mouth every 4 (four) hours as needed for severe pain or breakthrough pain.  . Oxycodone HCl 10 MG TABS Take 10 mg by mouth 2 (two) times daily as needed (pain).   Marland Kitchen oxyCODONE-acetaminophen (PERCOCET) 10-325 MG tablet Take 1 tablet by mouth every 4 (four) hours as needed for up to 1 dose for pain.  Marland Kitchen senna-docusate (SENOKOT-S) 8.6-50 MG tablet Take 2 tablets by mouth at bedtime.  . Vitamin D, Ergocalciferol, (DRISDOL) 1.25 MG (50000 UT) CAPS capsule Take 50,000 Units by mouth once a week.   No facility-administered encounter medications on file as of 01/24/2020.    Allergy:  Allergies  Allergen Reactions  . Ciprofloxacin Swelling    Lips swell, tongue swells, face swells  . Citalopram Other (See Comments)    Possible cause of pancytopenia. Swelling of tongue, face and throat  . Lamotrigine Other (See Comments)    Possible cause of pancytopenia. Swelling of face, throat and tongue  . Sulfa Antibiotics Anaphylaxis  . Tramadol Anaphylaxis, Shortness Of Breath and Swelling    Social Hx:   Social  History   Socioeconomic History  . Marital status: Soil scientist    Spouse name: Not on file  . Number of children: Not on file  . Years of education: Not on file  . Highest education level: Not on file  Occupational History  . Not on file  Tobacco Use  . Smoking status: Never Smoker  . Smokeless tobacco: Never Used  Substance and Sexual Activity  . Alcohol use: Not Currently  . Drug use: No  . Sexual activity: Yes    Birth control/protection: Post-menopausal, Surgical    Comment: perimenopausal; no sex in years  Other Topics Concern  . Not on file  Social History Narrative  . Not on file   Social Determinants of Health   Financial Resource Strain:   . Difficulty of Paying Living Expenses:   Food Insecurity:   . Worried About Charity fundraiser in the Last Year:   . Arboriculturist in the Last Year:   Transportation Needs:   . Film/video editor (Medical):   Marland Kitchen Lack of Transportation (Non-Medical):  Physical Activity:   . Days of Exercise per Week:   . Minutes of Exercise per Session:   Stress:   . Feeling of Stress :   Social Connections:   . Frequency of Communication with Friends and Family:   . Frequency of Social Gatherings with Friends and Family:   . Attends Religious Services:   . Active Member of Clubs or Organizations:   . Attends Archivist Meetings:   Marland Kitchen Marital Status:   Intimate Partner Violence:   . Fear of Current or Ex-Partner:   . Emotionally Abused:   Marland Kitchen Physically Abused:   . Sexually Abused:     Past Surgical Hx:  Past Surgical History:  Procedure Laterality Date  . ABDOMINAL HYSTERECTOMY    . CERVICAL CONIZATION W/BX N/A 07/01/2016   Procedure: CONIZATION CERVIX WITH BIOPSY;  Surgeon: Chancy Milroy, MD;  Location: [facility];  Service: Gynecology;  Laterality: N/A;  . CHOLECYSTECTOMY    . HYSTEROSCOPY WITH D & C N/A 01/25/2016   Procedure: DILATATION AND CURETTAGE /HYSTEROSCOPY;  Surgeon: Mora Bellman, MD;  Location: [facility];  Service: Gynecology;  Laterality: N/A;  . LYMPH NODE BIOPSY Bilateral 07/22/2019   Procedure: LYMPH NODE BIOPSY;  Surgeon: Everitt Amber, MD;  Location: [facility];  Service: Gynecology;  Laterality: Bilateral;  . ROBOT ASSISTED MYOMECTOMY N/A 07/22/2019   Procedure: XI ROBOTIC ASSISTED LAPAROSCOPIC RADICAL UPPER VAGINECTOMY, LEFT SALPINECTOMY, RIGHT SALPINGOOOPHERECTOMY;  Surgeon: Everitt Amber, MD;  Location: [facility];  Service: Gynecology;  Laterality: N/A;  . VAGINAL HYSTERECTOMY N/A 03/11/2017   Procedure: HYSTERECTOMY VAGINAL WITH MORCELLATION;  Surgeon: Chancy Milroy, MD;  Location: [facility];  Service: Gynecology;  Laterality: N/A;    Past Medical Hx:  Past Medical History:  Diagnosis Date  . Alcohol abuse   . Anemia    patient denies  . Bipolar 1 disorder (HCC)    No medications currently  . CAP (community acquired pneumonia) 03/17/2015  . 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 Mental disorder   . Obesity   . PICC line infection 05/17/2015  . Sepsis due to Gram negative bacteria (MDR E Coli) 02/18/2015  . 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[place])   . UTI (lower urinary tract infection)   . Vaginal Pap smear, abnormal     Past Gynecological History:  See HPI No LMP recorded (lmp unknown). Patient has had a hysterectomy.  Family Hx:  Family History  Problem Relation Age of Onset  . Alcohol abuse Father   . Cancer Father   . Breast cancer Neg Hx     Review of Systems:  Constitutional  Feels well,    ENT Normal appearing ears and nares bilaterally Skin/Breast  No rash, sores, jaundice, itching, dryness Cardiovascular  No chest pain, shortness of breath, or edema  Pulmonary  No cough or wheeze.  Gastro Intestinal  No nausea, vomitting, or diarrhoea. No bright red blood per rectum, no abdominal pain, change in bowel movement, or constipation.  Genito Urinary  No frequency, urgency, dysuria, or discharge. Not sexually active. + vaginal spotting.   Musculo Skeletal  No myalgia, arthralgia, joint swelling or pain  Neurologic  No weakness, numbness, change in gait,  Psychology  No depression, anxiety, insomnia.   Vitals:  There were no vitals taken for this visit.  Physical Exam: WD in NAD Neck  Supple NROM, without any enlargements.  Lymph Node Survey No cervical supraclavicular or inguinal adenopathy Cardiovascular  Pulse normal rate, regularity  and rhythm. S1 and S2 normal.  Lungs  Clear to auscultation bilateraly, without wheezes/crackles/rhonchi. Good air movement.  Skin  No rash/lesions/breakdown  Psychiatry  Alert and oriented to person, place, and time  Abdomen  Normoactive bowel sounds, abdomen soft, non-tender and obese without evidence of hernia. Well healed abdominal incisions.  Back No CVA tenderness Genito Urinary  Vulva/vagina: Normal external female genitalia.  No lesions. No discharge or bleeding.  Bladder/urethra:  No lesions or masses, well supported bladder  Vagina: vaginal cuff smooth, free of lesions or masses or blood. Rectal  deferred Extremities  No bilateral cyanosis, clubbing or edema.   Thereasa Solo, MD  01/24/2020, 2:27 PM

## 2020-01-24 NOTE — Patient Instructions (Signed)
Please notify Dr Denman George at phone number [phone] if you notice vaginal bleeding, new pelvic or abdominal pains, bloating, feeling full easy, or a change in bladder or bowel function.   Dr Denman George has prescribed estradiol patch to place on the skin once per week. Wear the patch for a full week before removing and replacing with a new patch. If you do not like the symptoms that the patch gives you, it is ok to remove and not replace. Estrogen patches increase your risk for breast cancer, blood clots, strokes and heart attacks. You should not use them for more than 5 years.  Dr Denman George has refilled your 800mg  Ibuprofen.  Please have Dr Clabe Seal office contact Dr Serita Grit office (at [phone]) in July after you see him to request an appointment with her for October.

## 2020-01-25 ENCOUNTER — Encounter: Payer: Self-pay | Admitting: Gynecologic Oncology

## 2020-01-31 ENCOUNTER — Telehealth: Payer: Self-pay | Admitting: Oncology

## 2020-01-31 NOTE — Progress Notes (Signed)
Counseling intern (CI) spoke to patient on 01/31/2020 for a telehealth counseling session via phone. Counseling intern provided space for pt to open up about experiences and responded with empathy, compassion, and normalization of feelings.   Pt reported her oncologist confirmed that her cancer is in remission. Pt reported she is relieved, but still working on adjusting her mindset.    CI helped pt process emotions related to recent and past events.   [name] [place] Counseling Intern Voicemail:  [phone]

## 2020-01-31 NOTE — Telephone Encounter (Signed)
Jordan [name] asked how long it takes to heal from radiation.  Advised her about 6 months but it can take longer.  She said she is hurting a lot more and having to take ibuprofen.  She is worried about getting a refill.  Advised her to call back the last week of April to request a refill.    She also asked about the menopause symptoms she is having.  She was given a prescription for an estradiol patch by [name] but she is hesitant to use it because of the risk of breast cancer.  Discussed that there are pros and cons to using the patch and that it is up to her.  She verbalized agreement.

## 2020-02-07 NOTE — Progress Notes (Signed)
Counseling intern (CI) spoke to patient on 02/07/20 for a telehealth counseling session via phone. Counseling intern provided space for pt to open up about experiences and responded with empathy, compassion, and normalization of feelings. Pt presented to counseling with a calm demeanor.   Pt reported she had a good weekend. She reflected about how she and her boyfriend met and recognized that she is grateful that they were friends first because they now have a deep understanding of one another. Pt became agitated when she recalled a recent incident with her boyfriend's nursing home, explaining that they are confused about his appointment schedule. Pt stated she feels proud because she took matters in her own hands and called his doctors to clear up the dates and times for the appoinments. Pt also recognized her own courage and patience to face and go through cancer treatment. Pt stated she's been thinking about not being able to talk to me and realized that she will miss our talks and is disappointed to no longer have this resource. CI and pt talked about the counseling relationship and about how she can remember what we discussed in counsling and use these ideas going forward. Pt voiced understanding, but reitteratited her disappointment. CI also confirmed that pt can continue to see her counselor at [facility] at the [facility] where she goes for her medication.   CI helped client process emotional responses to recent and past events.CI plans to have the termination session next week, will review with the pt what she is taking away from counseling.  Next Counseling Session: Monday, February 14, 2020 at 1:00pm via phone  [name] [facility] Counseling Intern Voicemail:  [phone]

## 2020-02-08 ENCOUNTER — Telehealth: Payer: Self-pay | Admitting: *Deleted

## 2020-02-08 ENCOUNTER — Telehealth: Payer: Self-pay | Admitting: Oncology

## 2020-02-08 NOTE — Telephone Encounter (Signed)
I spoke with Jordan [name] about her pain.. She reports that her pain is not different than the pain she has been having, but the pain is persisting despite taking ibuprofen. She describes the pain as a "pins and needles feeling" in her vagina. Pt denies vaginal bleeding or any other complaints.

## 2020-02-08 NOTE — Telephone Encounter (Signed)
I returned Ms. Harter's call after speaking with Heywood Iles NP. [name] recommended that the patient try Replens. Pt is aware that the medication is an over the counter medication that can be purchased at [facility], [facility] or [facility] per the Replens website. Pt is voice concerns about her ability to get out of the house due to her vagnial pain. I recommended ordering the medication online, or asking her daughter to help her do so, if she was unable to physically get to the store. Pt instructed to call back to the clinic if the replens does not help with her pain.

## 2020-02-08 NOTE — Telephone Encounter (Signed)
Patient called back to get the name of the medicine the nurse called her about. The medicine is Replens and had the patient write it down. Patient also asked for a refill on her ibuprofen. Patient stated "I have been taking the medicine more than I'm supposed too. I'm taking three to four a day, and I have half bottle left. I having pins and needles feeling. The pain is the worse it has ever been." Message forwarded to [facility] APP

## 2020-02-08 NOTE — Telephone Encounter (Signed)
Called Jordan [name] to discuss her message left for Dr. Clabe Seal nurse.  She said she is still having pain "like pins and needles" in her vaginal area.  She is wondering if she should try douching but is worried because she was told not to because of recurrent UTI's.  Advised her to hold off for now.  She is taking Ibuprofen 800 mg which helps and is worried about getting refills.  She asked if she can get 2-3 refills sent to [facility].

## 2020-02-09 ENCOUNTER — Telehealth: Payer: Self-pay | Admitting: Oncology

## 2020-02-09 ENCOUNTER — Other Ambulatory Visit: Payer: Self-pay | Admitting: Gynecologic Oncology

## 2020-02-09 ENCOUNTER — Telehealth: Payer: Self-pay

## 2020-02-09 DIAGNOSIS — R102 Pelvic and perineal pain: Secondary | ICD-10-CM

## 2020-02-09 MED ORDER — IBUPROFEN 600 MG PO TABS: 600.0000 mg | ORAL_TABLET | Freq: Four times a day (QID) | ORAL | 0 refills | Status: DC | PRN

## 2020-02-09 NOTE — Telephone Encounter (Signed)
Jordan [name] called and said she has a doctor's appointment at 2 today and is going to have Medicaid transportation bring her to pick up Replens.  She is wondering if she can have the refill of Ibuprofen sent so she can pick it up as well.

## 2020-02-09 NOTE — Progress Notes (Signed)
See RN note. Patient has called several times this am with concerns about a refill on ibuprofen.  She was advised yesterday to start using replens for her vaginal symptoms.  Refill sent in for patient.

## 2020-02-09 NOTE — Telephone Encounter (Signed)
Called Reyes back and let her know that M. Elinor Parkinson, NP has sent in a refill of Ibuprofen to [facility].  She verbalized understanding and agreement.

## 2020-02-09 NOTE — Telephone Encounter (Signed)
Patient called to clarify if she needs to continue taking ibuprofen along with using the cream suggested.  NP says to continue with ibuprofen but not to take more than 3 tablets per day and take with food.  Will inform patient.  Spoke with patient concerning above information.  She voiced understanding at this time.  However, she is requesting more refills on the ibuprofen.  She states that "[name] always gives me more than 1 refill". Will inform NP.

## 2020-02-10 ENCOUNTER — Telehealth: Payer: Self-pay | Admitting: *Deleted

## 2020-02-10 NOTE — Telephone Encounter (Signed)
Patient called back asking about refill of ibuprofen again.  She had spoke with rad onc yesterday as well as gyn onc and thinks her prescription was called in by rad onc Joylene John, NP sent in).  Pt asking to have [name] call in more refills for her.  She stated that [facility] did not have any for her.  Pharmacy was called and clarified that Rx was received and it had been picked up yesterday.   Let patient know that [name] is in the OR this morning and we would have to call her back.  Patient verbalized understanding at this time.

## 2020-02-10 NOTE — Telephone Encounter (Signed)
Patient called stated "I called yesterday about a refill on my ibuprofen. I wanted to check to see if Dr Denman George added refills to the prescription." Explained the ibuprofen was called in to the pharmacy with no refills. Patient asked "can you ask her for refills, because if not I will be calling everyday for medication." Explained that I would ask, but Dr Denman George is in the OR this morning and clinic this afternoon. Asked the patient if she has tried the Replens; patient stated "I did last night and it helped. But I can't use it today because I see the doctor today for this UTI. Can I use a heating pad or cold cloth down there to help with the pain." Explained that she can use both the heat and cold, but to rotate and only have on for no more than 15 minutes at a time. Explained that we would call her back this afternoon. Patient verbalized understanding

## 2020-02-11 ENCOUNTER — Telehealth: Payer: Self-pay | Admitting: Oncology

## 2020-02-11 NOTE — Telephone Encounter (Signed)
Called Jordan [name] and advised her that [name] has reviewed her requests for the ibuprofen refill and does not want to refill it due to the risk for a GI bleed.  Discussed the ibuprofen should be for short time use and also that her pain may be caused by her recurrent UTI.  She said she has started the medications given to her by her urologist yesterday.  She has not used the Replens again or tried the heating pad/cool cloth but is going to this afternoon.    Zully asked what she should do when she runs out of the Ibuprofen.  Advised her that her pain may be better then and she may not need to take it. Also discussed that she can try taking Tylenol.  She said that she has tried it but it does not help.  She said she is out of her oxycodone as well.  Again advised her that the medications given by the urologist may help and to give it some time.  She said she has a bottle and a half of the Ibuprofen let so we discussed that she has a while before she runs out. And that she can call if the pain continues.  Also left messages for [name], [facility] the [place] regarding assistance with her mental health issues.

## 2020-02-14 ENCOUNTER — Telehealth: Payer: Self-pay

## 2020-02-14 NOTE — Telephone Encounter (Signed)
Returned call to patient.  Per pt, She received her 2nd Covid vaccination at [employer].  Patient was confused as to which pharmacy she went to and had to search in her purse. Reason for call, pt inquiring if some swelling in her arm is normal.  Arm does not hurt.  Encouraged pt to try ice on her arm.  If arm swelling or other symptoms arise, advised pt to go see her primary care doctor.  Pt voiced understanding of above at this time.

## 2020-02-21 ENCOUNTER — Telehealth: Payer: Self-pay | Admitting: Oncology

## 2020-02-21 ENCOUNTER — Encounter: Payer: Self-pay | Admitting: General Practice

## 2020-02-21 NOTE — Telephone Encounter (Signed)
Johny called and would like to continue counseling with the next counseling intern if possible. Referred her to [facility], who said the next intern will start in August.  She will contact patient in the interim to see if she can help.  Advised Moshe that [name] will be calling her and she verbalized agreement.

## 2020-02-21 NOTE — Progress Notes (Signed)
[place] Note  Spoke with Sanya by phone per referral from Santiago Glad Hess/RN. Luby shared how sad she is that she missed getting to say goodbye to counseling intern [name], and that she really values having someone in addition to her community counselor to speak with regularly. Provided empathic listening, normalization of feelings, information about new counseling intern's availability starting in August, and introduction to Spiritual Care. In the meantime, Adejah and I plan to speak next Tuesday 5/11.    [place], [place], [facility] Pager [phone] Voicemail [phone]

## 2020-02-25 ENCOUNTER — Encounter: Payer: Self-pay | Admitting: Oncology

## 2020-02-25 NOTE — Progress Notes (Signed)
Jordan [name] called and said she is feeling anxious because her boyfriend is having surgery on Monday.  She said she misses talking with [name], Counseling Intern but has been in touch with [name] and will talk with her again on Tuesday.

## 2020-02-28 ENCOUNTER — Telehealth: Payer: Self-pay | Admitting: Oncology

## 2020-02-28 NOTE — Telephone Encounter (Signed)
Jordan [name] called and said she is having lots of anxiety because her boyfriend is having surgery today.  She is wondering if she can talk to [name], [place].  Called [name] and she will call [name].

## 2020-02-29 ENCOUNTER — Encounter: Payer: Self-pay | Admitting: General Practice

## 2020-02-29 NOTE — Progress Notes (Signed)
[facility] Spiritual Care Note  Spoke with Sumaiyah yesterday per referral from Santiago Glad Hess/RN for emotional support related to the distress of her partner's surgery taking place that day. Left follow-up voicemail of encouragement this morning, requesting callback as needed/desired.   [place], [place], [facility] Pager [phone] Voicemail [phone]

## 2020-03-01 ENCOUNTER — Encounter: Payer: Self-pay | Admitting: Oncology

## 2020-03-01 NOTE — Progress Notes (Signed)
[place] called and said her boyfriend's surgery went ok.  She said she has been able to talk to him once but he was groggy.  She is still scared that something may happen to him but is trying to keep busy and keep her mind off of it.  She said she enjoyed talking with LIsa, Chaplain.

## 2020-03-09 ENCOUNTER — Telehealth: Payer: Self-pay | Admitting: *Deleted

## 2020-03-09 NOTE — Telephone Encounter (Signed)
Called and scheduled the patient for a follow up appt for Oct. Mailed the patient a calendar

## 2020-03-13 ENCOUNTER — Telehealth: Payer: Self-pay | Admitting: Oncology

## 2020-03-13 NOTE — Telephone Encounter (Signed)
Jordan [name] called and said she is feeling better.  She said she went to her urologist at [facility] last week.  She had a "scope" done and there was red areas that looked like scratches.  She is wondering if this was from radiation.  Advised her that she should discuss this with Dr. Sondra Come at her next appointment.

## 2020-03-15 ENCOUNTER — Telehealth: Payer: Self-pay

## 2020-03-16 ENCOUNTER — Telehealth: Payer: Self-pay

## 2020-03-17 NOTE — Telephone Encounter (Signed)
Spoke with patient and advised her that Dr. Sondra Come was told that she wanted him to call her again today. Pt. reports she was confused earlier on the phone with him.    By Barbra Sarks

## 2020-03-22 NOTE — Telephone Encounter (Signed)
  03/16/2020 09:53 AM Phone (Incoming) Am…Lie, Dean (Self) [phone] (H)   Pt. called to verify her appt. in July. Advised it is 7/12 at 3 pm.    By De Burrs, RN

## 2020-04-03 ENCOUNTER — Ambulatory Visit: Payer: Medicaid Other

## 2020-04-09 ENCOUNTER — Emergency Department (HOSPITAL_COMMUNITY): Payer: Medicaid Other | Admitting: Certified Registered Nurse Anesthetist

## 2020-04-09 ENCOUNTER — Encounter (HOSPITAL_COMMUNITY): Payer: Self-pay

## 2020-04-09 ENCOUNTER — Inpatient Hospital Stay (HOSPITAL_COMMUNITY): Payer: Medicaid Other

## 2020-04-09 ENCOUNTER — Encounter (HOSPITAL_COMMUNITY)
Admission: EM | Disposition: A | Discharge status: Rehabilitation Facility | Payer: Self-pay | Source: Home / Self Care | Attending: Student

## 2020-04-09 ENCOUNTER — Emergency Department (HOSPITAL_COMMUNITY): Payer: Medicaid Other

## 2020-04-09 ENCOUNTER — Inpatient Hospital Stay (HOSPITAL_COMMUNITY)
Admission: EM | Admit: 2020-04-09 | Discharge: 2020-04-15 | DRG: 493 | Disposition: A | Discharge status: Rehabilitation Facility | Payer: Medicaid Other | Attending: Student | Admitting: Student

## 2020-04-09 DIAGNOSIS — W010XXA Fall on same level from slipping, tripping and stumbling without subsequent striking against object, initial encounter: Secondary | ICD-10-CM | POA: Diagnosis present

## 2020-04-09 DIAGNOSIS — D62 Acute posthemorrhagic anemia: Secondary | ICD-10-CM | POA: Diagnosis not present

## 2020-04-09 DIAGNOSIS — S82201S Unspecified fracture of shaft of right tibia, sequela: Secondary | ICD-10-CM | POA: Diagnosis not present

## 2020-04-09 DIAGNOSIS — E559 Vitamin D deficiency, unspecified: Secondary | ICD-10-CM | POA: Diagnosis not present

## 2020-04-09 DIAGNOSIS — Y92128 Other place in nursing home as the place of occurrence of the external cause: Secondary | ICD-10-CM | POA: Diagnosis not present

## 2020-04-09 DIAGNOSIS — S82291E Other fracture of shaft of right tibia, subsequent encounter for open fracture type I or II with routine healing: Secondary | ICD-10-CM | POA: Diagnosis not present

## 2020-04-09 DIAGNOSIS — S82251B Displaced comminuted fracture of shaft of right tibia, initial encounter for open fracture type I or II: Principal | ICD-10-CM | POA: Diagnosis present

## 2020-04-09 DIAGNOSIS — Z20822 Contact with and (suspected) exposure to covid-19: Secondary | ICD-10-CM | POA: Diagnosis present

## 2020-04-09 DIAGNOSIS — F411 Generalized anxiety disorder: Secondary | ICD-10-CM | POA: Diagnosis not present

## 2020-04-09 DIAGNOSIS — D72819 Decreased white blood cell count, unspecified: Secondary | ICD-10-CM | POA: Diagnosis not present

## 2020-04-09 DIAGNOSIS — S82401S Unspecified fracture of shaft of right fibula, sequela: Secondary | ICD-10-CM | POA: Diagnosis not present

## 2020-04-09 DIAGNOSIS — S82431B Displaced oblique fracture of shaft of right fibula, initial encounter for open fracture type I or II: Secondary | ICD-10-CM | POA: Diagnosis present

## 2020-04-09 DIAGNOSIS — S82402A Unspecified fracture of shaft of left fibula, initial encounter for closed fracture: Secondary | ICD-10-CM | POA: Diagnosis not present

## 2020-04-09 DIAGNOSIS — S8251XA Displaced fracture of medial malleolus of right tibia, initial encounter for closed fracture: Secondary | ICD-10-CM

## 2020-04-09 DIAGNOSIS — Z90710 Acquired absence of both cervix and uterus: Secondary | ICD-10-CM | POA: Diagnosis not present

## 2020-04-09 DIAGNOSIS — S82841A Displaced bimalleolar fracture of right lower leg, initial encounter for closed fracture: Secondary | ICD-10-CM | POA: Diagnosis present

## 2020-04-09 DIAGNOSIS — S82301B Unspecified fracture of lower end of right tibia, initial encounter for open fracture type I or II: Secondary | ICD-10-CM

## 2020-04-09 DIAGNOSIS — Z8544 Personal history of malignant neoplasm of other female genital organs: Secondary | ICD-10-CM

## 2020-04-09 DIAGNOSIS — N390 Urinary tract infection, site not specified: Secondary | ICD-10-CM | POA: Diagnosis not present

## 2020-04-09 DIAGNOSIS — G8918 Other acute postprocedural pain: Secondary | ICD-10-CM | POA: Diagnosis not present

## 2020-04-09 DIAGNOSIS — M25571 Pain in right ankle and joints of right foot: Secondary | ICD-10-CM | POA: Diagnosis present

## 2020-04-09 DIAGNOSIS — Z6841 Body Mass Index (BMI) 40.0 and over, adult: Secondary | ICD-10-CM | POA: Diagnosis not present

## 2020-04-09 DIAGNOSIS — W19XXXA Unspecified fall, initial encounter: Secondary | ICD-10-CM

## 2020-04-09 DIAGNOSIS — Z419 Encounter for procedure for purposes other than remedying health state, unspecified: Secondary | ICD-10-CM

## 2020-04-09 DIAGNOSIS — Z09 Encounter for follow-up examination after completed treatment for conditions other than malignant neoplasm: Secondary | ICD-10-CM

## 2020-04-09 DIAGNOSIS — S82202A Unspecified fracture of shaft of left tibia, initial encounter for closed fracture: Secondary | ICD-10-CM | POA: Diagnosis not present

## 2020-04-09 DIAGNOSIS — S82831B Other fracture of upper and lower end of right fibula, initial encounter for open fracture type I or II: Secondary | ICD-10-CM | POA: Diagnosis present

## 2020-04-09 DIAGNOSIS — G8929 Other chronic pain: Secondary | ICD-10-CM | POA: Diagnosis present

## 2020-04-09 DIAGNOSIS — F3170 Bipolar disorder, currently in remission, most recent episode unspecified: Secondary | ICD-10-CM | POA: Diagnosis not present

## 2020-04-09 DIAGNOSIS — F319 Bipolar disorder, unspecified: Secondary | ICD-10-CM | POA: Diagnosis present

## 2020-04-09 DIAGNOSIS — A499 Bacterial infection, unspecified: Secondary | ICD-10-CM | POA: Diagnosis not present

## 2020-04-09 DIAGNOSIS — T148XXA Other injury of unspecified body region, initial encounter: Secondary | ICD-10-CM

## 2020-04-09 HISTORY — PX: EXTERNAL FIXATION LEG: SHX1549

## 2020-04-09 LAB — COMPREHENSIVE METABOLIC PANEL
ALT: 12 U/L (ref 0–44)
AST: 16 U/L (ref 15–41)
Albumin: 4 g/dL (ref 3.5–5.0)
Alkaline Phosphatase: 118 U/L (ref 38–126)
Anion gap: 8 (ref 5–15)
BUN: 11 mg/dL (ref 6–20)
CO2: 25 mmol/L (ref 22–32)
Calcium: 8.9 mg/dL (ref 8.9–10.3)
Chloride: 106 mmol/L (ref 98–111)
Creatinine, Ser: 0.71 mg/dL (ref 0.44–1.00)
GFR calc Af Amer: >60 mL/min (ref >60)
GFR calc non Af Amer: >60 mL/min (ref >60)
Glucose, Bld: 115 mg/dL — ABNORMAL HIGH (ref 70–99)
Potassium: 4.5 mmol/L (ref 3.5–5.1)
Sodium: 139 mmol/L (ref 135–145)
Total Bilirubin: 1.1 mg/dL (ref 0.3–1.2)
Total Protein: 6.8 g/dL (ref 6.5–8.1)

## 2020-04-09 LAB — CREATININE, SERUM
Creatinine, Ser: 0.83 mg/dL (ref 0.44–1.00)
GFR calc Af Amer: >60 mL/min (ref >60)
GFR calc non Af Amer: >60 mL/min (ref >60)

## 2020-04-09 LAB — CBC WITH DIFFERENTIAL/PLATELET
Abs Immature Granulocytes: 0.03 10*3/uL (ref 0.00–0.07)
Basophils Absolute: 0 10*3/uL (ref 0.0–0.1)
Basophils Relative: 0 %
Eosinophils Absolute: 0 10*3/uL (ref 0.0–0.5)
Eosinophils Relative: 0 %
HCT: 40.8 % (ref 36.0–46.0)
Hemoglobin: 13.7 g/dL (ref 12.0–15.0)
Immature Granulocytes: 0 %
Lymphocytes Relative: 11 %
Lymphs Abs: 0.9 10*3/uL (ref 0.7–4.0)
MCH: 32.3 pg (ref 26.0–34.0)
MCHC: 33.6 g/dL (ref 30.0–36.0)
MCV: 96.2 fL (ref 80.0–100.0)
Monocytes Absolute: 0.5 10*3/uL (ref 0.1–1.0)
Monocytes Relative: 6 %
Neutro Abs: 6.4 10*3/uL (ref 1.7–7.7)
Neutrophils Relative %: 83 %
Platelets: 159 10*3/uL (ref 150–400)
RBC: 4.24 MIL/uL (ref 3.87–5.11)
RDW: 13.5 % (ref 11.5–15.5)
WBC: 7.8 10*3/uL (ref 4.0–10.5)
nRBC: 0 % (ref 0.0–0.2)

## 2020-04-09 LAB — CBC
HCT: 38.5 % (ref 36.0–46.0)
Hemoglobin: 13 g/dL (ref 12.0–15.0)
MCH: 32.4 pg (ref 26.0–34.0)
MCHC: 33.8 g/dL (ref 30.0–36.0)
MCV: 96 fL (ref 80.0–100.0)
Platelets: 163 10*3/uL (ref 150–400)
RBC: 4.01 MIL/uL (ref 3.87–5.11)
RDW: 13.3 % (ref 11.5–15.5)
WBC: 8.3 10*3/uL (ref 4.0–10.5)
nRBC: 0 % (ref 0.0–0.2)

## 2020-04-09 LAB — I-STAT BETA HCG BLOOD, ED (MC, WL, AP ONLY): I-stat hCG, quantitative: <5 m[IU]/mL (ref <5)

## 2020-04-09 LAB — SARS CORONAVIRUS 2 BY RT PCR (HOSPITAL ORDER, PERFORMED IN ~~LOC~~ HOSPITAL LAB): SARS Coronavirus 2: NEGATIVE

## 2020-04-09 LAB — SURGICAL PCR SCREEN
MRSA, PCR: NEGATIVE
Staphylococcus aureus: NEGATIVE

## 2020-04-09 SURGERY — EXTERNAL FIXATION, LOWER EXTREMITY
Anesthesia: General | Site: Leg Lower | Laterality: Right

## 2020-04-09 MED ORDER — KETAMINE HCL 50 MG/5ML IJ SOSY
PREFILLED_SYRINGE | INTRAMUSCULAR | Status: AC
  Filled 2020-04-09: qty 10

## 2020-04-09 MED ORDER — OXYBUTYNIN CHLORIDE ER 10 MG PO TB24
10.0000 mg | ORAL_TABLET | Freq: Every day | ORAL | Status: DC
  Administered 2020-04-09 – 2020-04-15 (×6): 10 mg via ORAL
  Filled 2020-04-09 (×7): qty 1

## 2020-04-09 MED ORDER — METOCLOPRAMIDE HCL 5 MG PO TABS: 5.0000 mg | ORAL_TABLET | Freq: Three times a day (TID) | ORAL | Status: DC | PRN

## 2020-04-09 MED ORDER — CEFAZOLIN SODIUM-DEXTROSE 2-4 GM/100ML-% IV SOLN: 2.0000 g | INTRAVENOUS | Status: DC

## 2020-04-09 MED ORDER — SODIUM CHLORIDE 0.9 % IR SOLN
Status: DC | PRN
  Administered 2020-04-09: 3000 mL

## 2020-04-09 MED ORDER — METHOCARBAMOL 500 MG PO TABS
ORAL_TABLET | ORAL | Status: AC
  Filled 2020-04-09: qty 1

## 2020-04-09 MED ORDER — VANCOMYCIN HCL 1000 MG IV SOLR
INTRAVENOUS | Status: DC | PRN
  Administered 2020-04-09: 1000 mg

## 2020-04-09 MED ORDER — FENTANYL CITRATE (PF) 100 MCG/2ML IJ SOLN
100.0000 ug | Freq: Once | INTRAMUSCULAR | Status: AC
  Administered 2020-04-09: 100 ug via INTRAVENOUS
  Filled 2020-04-09: qty 2

## 2020-04-09 MED ORDER — DEXAMETHASONE SODIUM PHOSPHATE 10 MG/ML IJ SOLN
INTRAMUSCULAR | Status: DC | PRN
  Administered 2020-04-09: 5 mg via INTRAVENOUS

## 2020-04-09 MED ORDER — POLYETHYLENE GLYCOL 3350 17 G PO PACK: 17.0000 g | PACK | Freq: Every day | ORAL | Status: DC | PRN

## 2020-04-09 MED ORDER — LIDOCAINE 2% (20 MG/ML) 5 ML SYRINGE
INTRAMUSCULAR | Status: DC | PRN
  Administered 2020-04-09: 100 mg via INTRAVENOUS

## 2020-04-09 MED ORDER — FENTANYL CITRATE (PF) 250 MCG/5ML IJ SOLN
INTRAMUSCULAR | Status: AC
  Filled 2020-04-09: qty 5

## 2020-04-09 MED ORDER — CEFAZOLIN SODIUM-DEXTROSE 2-4 GM/100ML-% IV SOLN
2.0000 g | Freq: Three times a day (TID) | INTRAVENOUS | Status: AC
  Administered 2020-04-09 – 2020-04-10 (×2): 2 g via INTRAVENOUS
  Administered 2020-04-10: 3 g via INTRAVENOUS
  Filled 2020-04-09 (×3): qty 100

## 2020-04-09 MED ORDER — OXYCODONE HCL 5 MG PO TABS
ORAL_TABLET | ORAL | Status: AC
  Administered 2020-04-09: 5 mg via ORAL
  Filled 2020-04-09: qty 1

## 2020-04-09 MED ORDER — SUGAMMADEX SODIUM 200 MG/2ML IV SOLN
INTRAVENOUS | Status: DC | PRN
  Administered 2020-04-09: 400 mg via INTRAVENOUS

## 2020-04-09 MED ORDER — ENOXAPARIN SODIUM 40 MG/0.4ML ~~LOC~~ SOLN
40.0000 mg | SUBCUTANEOUS | Status: DC
  Administered 2020-04-11 – 2020-04-15 (×6): 40 mg via SUBCUTANEOUS
  Filled 2020-04-09 (×6): qty 0.4

## 2020-04-09 MED ORDER — ONDANSETRON HCL 4 MG/2ML IJ SOLN
4.0000 mg | Freq: Four times a day (QID) | INTRAMUSCULAR | Status: DC | PRN
  Administered 2020-04-10: 4 mg via INTRAVENOUS

## 2020-04-09 MED ORDER — MAGNESIUM CITRATE PO SOLN: 1.0000 | Freq: Once | ORAL | Status: DC | PRN

## 2020-04-09 MED ORDER — ONDANSETRON HCL 4 MG/2ML IJ SOLN
4.0000 mg | Freq: Once | INTRAMUSCULAR | Status: AC
  Administered 2020-04-09: 4 mg via INTRAVENOUS
  Filled 2020-04-09: qty 2

## 2020-04-09 MED ORDER — DEXTROSE 5 % IV SOLN
INTRAVENOUS | Status: DC | PRN
  Administered 2020-04-09: 3 g via INTRAVENOUS

## 2020-04-09 MED ORDER — PROPOFOL 10 MG/ML IV BOLUS
INTRAVENOUS | Status: AC
  Filled 2020-04-09: qty 20

## 2020-04-09 MED ORDER — DOCUSATE SODIUM 100 MG PO CAPS
100.0000 mg | ORAL_CAPSULE | Freq: Two times a day (BID) | ORAL | Status: DC
  Administered 2020-04-09 – 2020-04-15 (×11): 100 mg via ORAL
  Filled 2020-04-09 (×11): qty 1

## 2020-04-09 MED ORDER — TOBRAMYCIN SULFATE 1.2 G IJ SOLR
INTRAMUSCULAR | Status: DC | PRN
  Administered 2020-04-09: 1.2 g

## 2020-04-09 MED ORDER — SUCCINYLCHOLINE CHLORIDE 200 MG/10ML IV SOSY
PREFILLED_SYRINGE | INTRAVENOUS | Status: AC
  Filled 2020-04-09: qty 10

## 2020-04-09 MED ORDER — CHLORHEXIDINE GLUCONATE 4 % EX LIQD
60.0000 mL | Freq: Once | CUTANEOUS | Status: DC
  Filled 2020-04-09: qty 60

## 2020-04-09 MED ORDER — ROCURONIUM BROMIDE 10 MG/ML (PF) SYRINGE
PREFILLED_SYRINGE | INTRAVENOUS | Status: DC | PRN
  Administered 2020-04-09: 60 mg via INTRAVENOUS

## 2020-04-09 MED ORDER — CEFAZOLIN SODIUM-DEXTROSE 1-4 GM/50ML-% IV SOLN
1.0000 g | Freq: Once | INTRAVENOUS | Status: AC
  Administered 2020-04-09: 1 g via INTRAVENOUS
  Filled 2020-04-09: qty 50

## 2020-04-09 MED ORDER — METHOCARBAMOL 1000 MG/10ML IJ SOLN
500.0000 mg | Freq: Four times a day (QID) | INTRAVENOUS | Status: DC | PRN
  Filled 2020-04-09: qty 5

## 2020-04-09 MED ORDER — OXYCODONE HCL 5 MG PO TABS
5.0000 mg | ORAL_TABLET | ORAL | Status: DC | PRN
  Administered 2020-04-09 – 2020-04-10 (×2): 10 mg via ORAL
  Filled 2020-04-09 (×2): qty 2

## 2020-04-09 MED ORDER — ACETAMINOPHEN 500 MG PO TABS
1000.0000 mg | ORAL_TABLET | Freq: Three times a day (TID) | ORAL | Status: AC
  Administered 2020-04-10 (×3): 1000 mg via ORAL
  Filled 2020-04-09 (×3): qty 2

## 2020-04-09 MED ORDER — LURASIDONE HCL 20 MG PO TABS
20.0000 mg | ORAL_TABLET | Freq: Every day | ORAL | Status: DC
  Administered 2020-04-09 – 2020-04-15 (×6): 20 mg via ORAL
  Filled 2020-04-09 (×7): qty 1

## 2020-04-09 MED ORDER — KETAMINE HCL 10 MG/ML IJ SOLN
INTRAMUSCULAR | Status: DC | PRN
  Administered 2020-04-09: 50 mg via INTRAVENOUS

## 2020-04-09 MED ORDER — MIDAZOLAM HCL 2 MG/2ML IJ SOLN
INTRAMUSCULAR | Status: AC
  Filled 2020-04-09: qty 2

## 2020-04-09 MED ORDER — MIDAZOLAM HCL 2 MG/2ML IJ SOLN
INTRAMUSCULAR | Status: DC | PRN
  Administered 2020-04-09: 2 mg via INTRAVENOUS

## 2020-04-09 MED ORDER — LACTATED RINGERS IV SOLN: INTRAVENOUS | Status: DC | PRN

## 2020-04-09 MED ORDER — OXCARBAZEPINE 150 MG PO TABS
75.0000 mg | ORAL_TABLET | Freq: Two times a day (BID) | ORAL | Status: DC
  Administered 2020-04-09 – 2020-04-15 (×11): 75 mg via ORAL
  Filled 2020-04-09 (×13): qty 0.5

## 2020-04-09 MED ORDER — FENTANYL CITRATE (PF) 250 MCG/5ML IJ SOLN
INTRAMUSCULAR | Status: DC | PRN
  Administered 2020-04-09: 50 ug via INTRAVENOUS
  Administered 2020-04-09: 150 ug via INTRAVENOUS

## 2020-04-09 MED ORDER — FENTANYL CITRATE (PF) 100 MCG/2ML IJ SOLN
INTRAMUSCULAR | Status: AC
  Filled 2020-04-09: qty 2

## 2020-04-09 MED ORDER — ONDANSETRON HCL 4 MG/2ML IJ SOLN
INTRAMUSCULAR | Status: DC | PRN
  Administered 2020-04-09: 4 mg via INTRAVENOUS

## 2020-04-09 MED ORDER — NAPROXEN 250 MG PO TABS
250.0000 mg | ORAL_TABLET | Freq: Two times a day (BID) | ORAL | Status: DC
  Administered 2020-04-09 – 2020-04-15 (×11): 250 mg via ORAL
  Filled 2020-04-09 (×11): qty 1

## 2020-04-09 MED ORDER — POVIDONE-IODINE 10 % EX SWAB
2.0000 "application " | Freq: Once | CUTANEOUS | Status: AC
  Administered 2020-04-09: 2 via TOPICAL

## 2020-04-09 MED ORDER — ROCURONIUM BROMIDE 10 MG/ML (PF) SYRINGE
PREFILLED_SYRINGE | INTRAVENOUS | Status: AC
  Filled 2020-04-09: qty 10

## 2020-04-09 MED ORDER — DEXMEDETOMIDINE HCL IN NACL 200 MCG/50ML IV SOLN
INTRAVENOUS | Status: DC | PRN
Start: 2020-04-09 — End: 2020-04-09
  Administered 2020-04-09 (×2): 20 ug via INTRAVENOUS

## 2020-04-09 MED ORDER — TOBRAMYCIN SULFATE 1.2 G IJ SOLR
INTRAMUSCULAR | Status: AC
  Filled 2020-04-09: qty 1.2

## 2020-04-09 MED ORDER — HYDROMORPHONE HCL 1 MG/ML IJ SOLN
INTRAMUSCULAR | Status: AC
  Administered 2020-04-09: 0.5 mg via INTRAVENOUS
  Filled 2020-04-09: qty 1

## 2020-04-09 MED ORDER — BISACODYL 5 MG PO TBEC: 5.0000 mg | DELAYED_RELEASE_TABLET | Freq: Every day | ORAL | Status: DC | PRN

## 2020-04-09 MED ORDER — TETANUS-DIPHTH-ACELL PERTUSSIS 5-2.5-18.5 LF-MCG/0.5 IM SUSP
0.5000 mL | Freq: Once | INTRAMUSCULAR | Status: AC
  Administered 2020-04-09: 0.5 mL via INTRAMUSCULAR
  Filled 2020-04-09: qty 0.5

## 2020-04-09 MED ORDER — PROPOFOL 10 MG/ML IV BOLUS
INTRAVENOUS | Status: DC | PRN
  Administered 2020-04-09: 160 mg via INTRAVENOUS

## 2020-04-09 MED ORDER — FENTANYL CITRATE (PF) 100 MCG/2ML IJ SOLN
50.0000 ug | Freq: Once | INTRAMUSCULAR | Status: AC
  Administered 2020-04-09: 50 ug via INTRAVENOUS
  Filled 2020-04-09: qty 2

## 2020-04-09 MED ORDER — LIDOCAINE 2% (20 MG/ML) 5 ML SYRINGE
INTRAMUSCULAR | Status: AC
  Filled 2020-04-09: qty 5

## 2020-04-09 MED ORDER — ONDANSETRON HCL 4 MG PO TABS: 4.0000 mg | ORAL_TABLET | Freq: Four times a day (QID) | ORAL | Status: DC | PRN

## 2020-04-09 MED ORDER — DIPHENHYDRAMINE HCL 12.5 MG/5ML PO ELIX: 12.5000 mg | ORAL_SOLUTION | ORAL | Status: DC | PRN

## 2020-04-09 MED ORDER — HYDROMORPHONE HCL 1 MG/ML IJ SOLN
0.5000 mg | INTRAMUSCULAR | Status: DC | PRN
  Administered 2020-04-09: 0.5 mg via INTRAVENOUS

## 2020-04-09 MED ORDER — CHLORHEXIDINE GLUCONATE 0.12 % MT SOLN
OROMUCOSAL | Status: AC
  Filled 2020-04-09: qty 15

## 2020-04-09 MED ORDER — SUCCINYLCHOLINE CHLORIDE 200 MG/10ML IV SOSY
PREFILLED_SYRINGE | INTRAVENOUS | Status: DC | PRN
  Administered 2020-04-09: 100 mg via INTRAVENOUS

## 2020-04-09 MED ORDER — ONDANSETRON HCL 4 MG/2ML IJ SOLN
INTRAMUSCULAR | Status: AC
  Filled 2020-04-09: qty 2

## 2020-04-09 MED ORDER — METHOCARBAMOL 500 MG PO TABS
500.0000 mg | ORAL_TABLET | Freq: Four times a day (QID) | ORAL | Status: DC | PRN
  Administered 2020-04-09 – 2020-04-15 (×14): 500 mg via ORAL
  Filled 2020-04-09 (×13): qty 1

## 2020-04-09 MED ORDER — PHENYLEPHRINE 40 MCG/ML (10ML) SYRINGE FOR IV PUSH (FOR BLOOD PRESSURE SUPPORT)
PREFILLED_SYRINGE | INTRAVENOUS | Status: AC
  Filled 2020-04-09: qty 10

## 2020-04-09 MED ORDER — METOCLOPRAMIDE HCL 5 MG/ML IJ SOLN: 5.0000 mg | Freq: Three times a day (TID) | INTRAMUSCULAR | Status: DC | PRN

## 2020-04-09 MED ORDER — VANCOMYCIN HCL 1000 MG IV SOLR
INTRAVENOUS | Status: AC
  Filled 2020-04-09: qty 1000

## 2020-04-09 MED ORDER — FENTANYL CITRATE (PF) 100 MCG/2ML IJ SOLN
50.0000 ug | Freq: Once | INTRAMUSCULAR | Status: AC
  Administered 2020-04-09: 50 ug via INTRAVENOUS

## 2020-04-09 SURGICAL SUPPLY — 50 items
BANDAGE ESMARK 6X9 LF (GAUZE/BANDAGES/DRESSINGS) ×1 IMPLANT
BAR GLASS FIBER EXFX 11X400 (EXFIX) ×2 IMPLANT
BAR GLASS FIBER EXFX 11X500 (EXFIX) ×2 IMPLANT
BNDG COHESIVE 4X5 TAN STRL (GAUZE/BANDAGES/DRESSINGS) ×2 IMPLANT
BNDG COHESIVE 6X5 TAN STRL LF (GAUZE/BANDAGES/DRESSINGS) ×2 IMPLANT
BNDG ELASTIC 4X5.8 VLCR STR LF (GAUZE/BANDAGES/DRESSINGS) ×2 IMPLANT
BNDG ELASTIC 6X5.8 VLCR STR LF (GAUZE/BANDAGES/DRESSINGS) ×2 IMPLANT
BNDG ESMARK 6X9 LF (GAUZE/BANDAGES/DRESSINGS) ×2
BNDG GAUZE ELAST 4 BULKY (GAUZE/BANDAGES/DRESSINGS) ×2 IMPLANT
BRUSH SCRUB EZ PLAIN DRY (MISCELLANEOUS) IMPLANT
CANISTER WOUND CARE 500ML ATS (WOUND CARE) ×2 IMPLANT
CAP PROTECTIVE TRANSFX 4.5X5MM (EXFIX) ×4 IMPLANT
CLAMP PIN 45MM 1 BAR (EXFIX) ×4 IMPLANT
COVER SURGICAL LIGHT HANDLE (MISCELLANEOUS) ×2 IMPLANT
COVER WAND RF STERILE (DRAPES) IMPLANT
DRAPE C-ARM 42X72 X-RAY (DRAPES) ×2 IMPLANT
DRAPE C-ARMOR (DRAPES) ×2 IMPLANT
DRAPE U-SHAPE 47X51 STRL (DRAPES) ×2 IMPLANT
ELECT REM PT RETURN 9FT ADLT (ELECTROSURGICAL) ×2
ELECTRODE REM PT RTRN 9FT ADLT (ELECTROSURGICAL) ×1 IMPLANT
GAUZE SPONGE 4X4 12PLY STRL (GAUZE/BANDAGES/DRESSINGS) IMPLANT
GAUZE XEROFORM 5X9 LF (GAUZE/BANDAGES/DRESSINGS) ×2 IMPLANT
GLOVE BIO SURGEON STRL SZ7.5 (GLOVE) ×2 IMPLANT
GLOVE BIO SURGEON STRL SZ8 (GLOVE) IMPLANT
GLOVE BIOGEL PI IND STRL 8 (GLOVE) ×1 IMPLANT
GLOVE BIOGEL PI INDICATOR 8 (GLOVE) ×1
GLOVE ECLIPSE 8.0 STRL XLNG CF (GLOVE) ×4 IMPLANT
GOWN STRL REUS W/ TWL LRG LVL3 (GOWN DISPOSABLE) ×5 IMPLANT
GOWN STRL REUS W/ TWL XL LVL3 (GOWN DISPOSABLE) ×1 IMPLANT
GOWN STRL REUS W/TWL LRG LVL3 (GOWN DISPOSABLE) ×5
GOWN STRL REUS W/TWL XL LVL3 (GOWN DISPOSABLE) ×1
HANDPIECE INTERPULSE COAX TIP (DISPOSABLE) ×1
KIT BASIN OR (CUSTOM PROCEDURE TRAY) ×2 IMPLANT
KIT PREVENA INCISION MGT 13 (CANNISTER) ×2 IMPLANT
KIT TURNOVER KIT B (KITS) ×2 IMPLANT
MANIFOLD NEPTUNE II (INSTRUMENTS) ×2 IMPLANT
NS IRRIG 1000ML POUR BTL (IV SOLUTION) ×2 IMPLANT
PACK ORTHO EXTREMITY (CUSTOM PROCEDURE TRAY) ×2 IMPLANT
PAD ARMBOARD 7.5X6 YLW CONV (MISCELLANEOUS) ×4 IMPLANT
PADDING CAST COTTON 6X4 STRL (CAST SUPPLIES) IMPLANT
PIN CLAMP 2BAR 75MM BLUE (EXFIX) ×2 IMPLANT
PIN HALF ORANGE 5X200X45MM (EXFIX) ×4 IMPLANT
PIN TRANSFIXING 5.0 (EXFIX) ×4 IMPLANT
POST 30 DEG ANGLED 11MM (EXFIX) ×4 IMPLANT
SET HNDPC FAN SPRY TIP SCT (DISPOSABLE) ×1 IMPLANT
SPONGE LAP 18X18 RF (DISPOSABLE) IMPLANT
STOCKINETTE IMPERVIOUS LG (DRAPES) IMPLANT
SUT ETHILON 2 0 FS 18 (SUTURE) ×4 IMPLANT
TOWEL GREEN STERILE (TOWEL DISPOSABLE) ×4 IMPLANT
UNDERPAD 30X36 HEAVY ABSORB (UNDERPADS AND DIAPERS) ×2 IMPLANT

## 2020-04-09 NOTE — ED Notes (Signed)
She is loaded onto stretcher by [facility] and leaves our facility at this time without incident. I also phone report to the charge nurse at [facility].D.

## 2020-04-09 NOTE — ED Notes (Signed)
Note: she had also received two grams of Ancef IV en route to hospital.

## 2020-04-09 NOTE — Consult Note (Signed)
ORTHOPAEDIC CONSULTATION  REQUESTING PHYSICIAN: Theotis Burrow  Chief Complaint: Right open tibia and fibula fracture  HPI: Jordan [name] is a 54 y.o. female with fall resulting in a right open tibia and fibula fracture.  She was seen in [place] [facility] and transferred to [facility] for definitive fixation.  She has multiple medical problems.  She has no help at home.  She ambulated without assistive device prior to the surgery.  Past Medical History:  Diagnosis Date  . Alcohol abuse   . Anemia    patient denies  . Bipolar 1 disorder (HCC)    No medications currently  . CAP (community acquired pneumonia) 03/17/2015  . 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 Mental disorder   . Obesity   . PICC line infection 05/17/2015  . Sepsis due to Gram negative bacteria (MDR E Coli) 02/18/2015  . 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 UTI (lower urinary tract infection)   . Vaginal Pap smear, abnormal    Past Surgical History:  Procedure Laterality Date  . ABDOMINAL HYSTERECTOMY    . CERVICAL CONIZATION W/BX N/A 07/01/2016   Procedure: CONIZATION CERVIX WITH BIOPSY;  Surgeon: Chancy Milroy, MD;  Location: [facility];  Service: Gynecology;  Laterality: N/A;  . CHOLECYSTECTOMY    . HYSTEROSCOPY WITH D & C N/A 01/25/2016   Procedure: DILATATION AND CURETTAGE /HYSTEROSCOPY;  Surgeon: Mora Bellman, MD;  Location: [facility];  Service: Gynecology;  Laterality: N/A;  . LYMPH NODE BIOPSY Bilateral 07/22/2019   Procedure: LYMPH NODE BIOPSY;  Surgeon: Everitt Amber, MD;  Location: [facility];  Service: Gynecology;  Laterality: Bilateral;  . ROBOT ASSISTED MYOMECTOMY N/A 07/22/2019   Procedure: XI ROBOTIC ASSISTED LAPAROSCOPIC RADICAL UPPER VAGINECTOMY, LEFT SALPINECTOMY, RIGHT SALPINGOOOPHERECTOMY;  Surgeon: Everitt Amber, MD;  Location: [facility];  Service: Gynecology;  Laterality: N/A;  . VAGINAL HYSTERECTOMY N/A 03/11/2017   Procedure: HYSTERECTOMY VAGINAL WITH MORCELLATION;  Surgeon: Chancy Milroy, MD;  Location: [facility];  Service: Gynecology;  Laterality: N/A;   Social History   Socioeconomic History  . Marital status: Soil scientist    Spouse name: Not on file  . Number of children: Not on file  . Years of education: Not on file  . Highest education level: Not on file  Occupational History  . Not on file  Tobacco Use  . Smoking status: Never Smoker  . Smokeless tobacco: Never Used  Vaping Use  . Vaping Use: Never used  Substance and Sexual Activity  . Alcohol use: Not Currently  . Drug use: No  . Sexual activity: Yes    Birth control/protection: Post-menopausal, Surgical    Comment: perimenopausal; no sex in years  Other Topics Concern  . Not on file  Social History Narrative  . Not on file   Social Determinants of Health   Financial Resource Strain:   . Difficulty of Paying Living Expenses:   Food Insecurity:   . Worried About Charity fundraiser in the Last Year:   . Arboriculturist in the Last Year:   Transportation Needs:   . Film/video editor (Medical):   Marland Kitchen Lack of Transportation (Non-Medical):   Physical Activity:   . Days of Exercise per Week:   . Minutes of Exercise per Session:   Stress:   . Feeling of Stress :   Social Connections:   . Frequency of Communication with Friends and Family:   . Frequency of Social Gatherings  with Friends and Family:   . Attends Religious Services:   . Active Member of Clubs or Organizations:   . Attends Archivist Meetings:   Marland Kitchen Marital Status:    Family History  Problem Relation Age of Onset  . Alcohol abuse Father   . Cancer Father   . Breast cancer Neg Hx    Allergies  Allergen Reactions  . Ciprofloxacin Swelling    Lips swell, tongue swells, face swells  . Citalopram Other (See Comments)    Possible cause of pancytopenia. Swelling of tongue, face and throat  . Lamotrigine Other (See Comments)    Possible cause of pancytopenia. Swelling of face, throat and tongue  . Sulfa  Antibiotics Anaphylaxis  . Tramadol Anaphylaxis, Shortness Of Breath and Swelling   Prior to Admission medications   Medication Sig Start Date End Date Taking? Authorizing Provider  acetaminophen (TYLENOL) 500 MG tablet Take 1-2 tablets (500-1,000 mg total) by mouth every 6 (six) hours as needed for moderate pain. 07/30/19  Yes Cross, Melissa D, NP  Ferrous Sulfate (IRON) 325 (65 Fe) MG TABS Take 1 tablet by mouth every other day. 02/03/20  Yes [provider]  ibuprofen (ADVIL) 600 MG tablet Take 1 tablet (600 mg total) by mouth every 6 (six) hours as needed for moderate pain. 02/09/20  Yes Cross, Melissa D, NP  lurasidone (LATUDA) 20 MG TABS tablet Take 20 mg by mouth daily.   Yes [provider]  OXcarbazepine (TRILEPTAL) 150 MG tablet Take 75 mg by mouth in the morning and at bedtime. 03/09/20  Yes [provider]  oxybutynin (DITROPAN-XL) 10 MG 24 hr tablet Take 10 mg by mouth daily. 03/31/20  Yes [provider]  Oxycodone HCl 10 MG TABS Take 10 mg by mouth 2 (two) times daily as needed (pain).  01/07/19  Yes [provider]  Vitamin D, Ergocalciferol, (DRISDOL) 1.25 MG (50000 UT) CAPS capsule Take 50,000 Units by mouth once a week. 02/01/19  Yes [provider]  estradiol (CLIMARA - DOSED IN MG/24 HR) 0.05 mg/24hr patch Place 1 patch (0.05 mg total) onto the skin once a week. 01/24/20   Everitt Amber, MD  oxyCODONE (OXY IR/ROXICODONE) 5 MG immediate release tablet Take 1 tablet (5 mg total) by mouth every 4 (four) hours as needed for severe pain or breakthrough pain. Patient not taking: Reported on 04/09/2020 09/30/19   Gery Pray, MD   DG Tibia/Fibula Right Port  Result Date: 04/09/2020 CLINICAL DATA:  Open fracture EXAM: PORTABLE RIGHT TIBIA AND FIBULA - 2 VIEW COMPARISON:  None. FINDINGS: Acute comminuted fracture of the distal tibial diaphysis with 1.4 cm of posterior displacement and varus angulation. Multiple comminuted tibial fracture  fragments, the 2 largest measuring up to 4.6 cm and 2.7 cm. There is also an acute fracture of the distal fibular diaphysis with 1.0 cm of lateral displacement and 0.5 cm of anterior displacement as well as a similar degree of varus angulation. There is an additional obliquely oriented nondisplaced fracture of the proximal fibular diaphysis. Alignment at the knee and ankle appear anatomic on large field-of-view image. There is prominent soft tissue swelling at the fracture site. IMPRESSION: 1. Acute comminuted angulated fractures of the distal tibial and fibular diaphyses as described above. 2. Nondisplaced obliquely oriented fracture of the proximal fibular diaphysis. Electronically Signed   By: Davina Poke D.O.   On: 04/09/2020 11:47   Family History Reviewed and non-contributory, no pertinent history of problems with bleeding or anesthesia  Review of Systems 14 system ROS conducted and negative except for that noted in HPI   OBJECTIVE  Vitals: Patient Vitals for the past 8 hrs:  BP Temp Temp src Pulse Resp SpO2  04/09/20 1315 104/79 -- -- 76 (!) 26 98 %  04/09/20 1231 (!) 132/44 -- -- 85 18 99 %  04/09/20 1226 (!) 150/136 -- -- 80 16 98 %  04/09/20 1221 -- -- -- 75 17 100 %  04/09/20 1216 -- -- -- 83 -- 98 %  04/09/20 1211 -- -- -- 80 -- 100 %  04/09/20 1201 (!) 130/97 -- -- 70 -- 98 %  04/09/20 1151 -- -- -- 74 -- 99 %  04/09/20 1146 134/82 -- -- 83 -- 96 %  04/09/20 1141 -- -- -- 71 -- 98 %  04/09/20 1131 (!) 124/96 -- -- 83 -- 100 %  04/09/20 1121 -- -- -- 76 -- 100 %  04/09/20 1116 124/79 -- -- 72 -- 98 %  04/09/20 1111 -- -- -- 72 -- 100 %  04/09/20 1101 101/76 -- -- 67 -- 100 %  04/09/20 1051 -- -- -- 79 -- 100 %  04/09/20 1046 117/81 -- -- 71 -- 99 %  04/09/20 1044 -- -- -- 66 -- 99 %  04/09/20 1041 -- -- -- 69 -- 100 %  04/09/20 1034 -- -- -- 70 -- 100 %  04/09/20 1031 (!) 119/100 -- -- 74 -- 100 %  04/09/20 1024 -- -- -- 74 -- 98 %  04/09/20 1021 -- -- -- 72  -- 99 %  04/09/20 1016 115/74 -- -- 67 -- 98 %  04/09/20 1014 (!) 130/98 -- -- 72 -- 99 %  04/09/20 1011 -- -- -- 65 -- 95 %  04/09/20 1004 -- -- -- 66 -- 95 %  04/09/20 1001 (!) 160/91 97.9 F (36.6 C) Oral 66 16 98 %  04/09/20 0954 -- -- -- 72 -- 97 %   General: Alert, no acute distress Cardiovascular: Warm extremities noted Respiratory: No cyanosis, no use of accessory musculature GI: No organomegaly, abdomen is soft and non-tender Skin: No lesions in the area of chief complaint other than those listed below in MSK exam.  Neurologic: Sensation intact distally save for the below mentioned MSK exam Psychiatric: Patient is competent for consent with normal mood and affect Lymphatic: No swelling obvious and reported other than the area involved in the exam below Extremities  Right lower extremity: Open wound with bandages in place and compressive dressing.  Ankle is clearly deformed.  Warm well-perfused toes with intact DP/PT pulses.  Intact GSC/EHL/TA.  Test Results Imaging Imaging demonstrates a complex comminuted distal tibia and fibula fracture that may enter the joint on one view.  Labs cbc Recent Labs    04/09/20 1036  WBC 7.8  HGB 13.7  HCT 40.8  PLT 159    Labs inflam No results for input(s): CRP in the last 72 hours.  Invalid input(s): ESR  Labs coag No results for input(s): INR, PTT in the last 72 hours.  Invalid input(s): PT  Recent Labs    04/09/20 1036  NA 139  K 4.5  CL 106  CO2 25  GLUCOSE 115*  BUN 11  CREATININE 0.71  CALCIUM 8.9     ASSESSMENT AND PLAN: 54 y.o. female with the following: Grade 2 open distal tibia and fibula fracture.  Patient requires emergent management.  We will plan for washout and exfix due to the complex  nature of her fracture.  We will consult one of my colleagues who specializes in distal tibial fracture management.  We will temporize the patient now and plan for inpatient management until she is stable for  surgery.  We will manage the patient inpatient and if she requires it we may get a hospitalist consult for further management.  All questions were answered.  We talked about specific risk including infection, need for further procedures, wound site infections and nonunion and malunion.  Patient elected proceed with surgery.

## 2020-04-09 NOTE — Anesthesia Procedure Notes (Signed)
Procedure Name: Intubation Date/Time: 04/09/2020 2:19 PM Performed by: Janace Litten, CRNA Pre-anesthesia Checklist: Patient identified, Emergency Drugs available, Suction available and Patient being monitored Patient Re-evaluated:Patient Re-evaluated prior to induction Oxygen Delivery Method: Circle System Utilized Preoxygenation: Pre-oxygenation with 100% oxygen Induction Type: IV induction and Rapid sequence Ventilation: Mask ventilation without difficulty Laryngoscope Size: Mac and 3 Grade View: Grade I Tube type: Oral Tube size: 7.0 mm Number of attempts: 1 Airway Equipment and Method: Stylet Placement Confirmation: ETT inserted through vocal cords under direct vision,  positive ETCO2 and breath sounds checked- equal and bilateral Secured at: 22 cm Tube secured with: Tape Dental Injury: Teeth and Oropharynx as per pre-operative assessment

## 2020-04-09 NOTE — H&P (View-Only) (Signed)
ORTHOPAEDIC CONSULTATION  REQUESTING PHYSICIAN: Theotis Burrow  Chief Complaint: Right open tibia and fibula fracture  HPI: Jordan [name] is a 54 y.o. female with fall resulting in a right open tibia and fibula fracture.  She was seen in [place] [facility] and transferred to [facility] for definitive fixation.  She has multiple medical problems.  She has no help at home.  She ambulated without assistive device prior to the surgery.  Past Medical History:  Diagnosis Date  . Alcohol abuse   . Anemia    patient denies  . Bipolar 1 disorder (HCC)    No medications currently  . CAP (community acquired pneumonia) 03/17/2015  . 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 Mental disorder   . Obesity   . PICC line infection 05/17/2015  . Sepsis due to Gram negative bacteria (MDR E Coli) 02/18/2015  . 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 UTI (lower urinary tract infection)   . Vaginal Pap smear, abnormal    Past Surgical History:  Procedure Laterality Date  . ABDOMINAL HYSTERECTOMY    . CERVICAL CONIZATION W/BX N/A 07/01/2016   Procedure: CONIZATION CERVIX WITH BIOPSY;  Surgeon: Chancy Milroy, MD;  Location: [facility];  Service: Gynecology;  Laterality: N/A;  . CHOLECYSTECTOMY    . HYSTEROSCOPY WITH D & C N/A 01/25/2016   Procedure: DILATATION AND CURETTAGE /HYSTEROSCOPY;  Surgeon: Mora Bellman, MD;  Location: [facility];  Service: Gynecology;  Laterality: N/A;  . LYMPH NODE BIOPSY Bilateral 07/22/2019   Procedure: LYMPH NODE BIOPSY;  Surgeon: Everitt Amber, MD;  Location: [facility];  Service: Gynecology;  Laterality: Bilateral;  . ROBOT ASSISTED MYOMECTOMY N/A 07/22/2019   Procedure: XI ROBOTIC ASSISTED LAPAROSCOPIC RADICAL UPPER VAGINECTOMY, LEFT SALPINECTOMY, RIGHT SALPINGOOOPHERECTOMY;  Surgeon: Everitt Amber, MD;  Location: [facility];  Service: Gynecology;  Laterality: N/A;  . VAGINAL HYSTERECTOMY N/A 03/11/2017   Procedure: HYSTERECTOMY VAGINAL WITH MORCELLATION;  Surgeon: Chancy Milroy, MD;  Location: [facility];  Service: Gynecology;  Laterality: N/A;   Social History   Socioeconomic History  . Marital status: Soil scientist    Spouse name: Not on file  . Number of children: Not on file  . Years of education: Not on file  . Highest education level: Not on file  Occupational History  . Not on file  Tobacco Use  . Smoking status: Never Smoker  . Smokeless tobacco: Never Used  Vaping Use  . Vaping Use: Never used  Substance and Sexual Activity  . Alcohol use: Not Currently  . Drug use: No  . Sexual activity: Yes    Birth control/protection: Post-menopausal, Surgical    Comment: perimenopausal; no sex in years  Other Topics Concern  . Not on file  Social History Narrative  . Not on file   Social Determinants of Health   Financial Resource Strain:   . Difficulty of Paying Living Expenses:   Food Insecurity:   . Worried About Charity fundraiser in the Last Year:   . Arboriculturist in the Last Year:   Transportation Needs:   . Film/video editor (Medical):   Marland Kitchen Lack of Transportation (Non-Medical):   Physical Activity:   . Days of Exercise per Week:   . Minutes of Exercise per Session:   Stress:   . Feeling of Stress :   Social Connections:   . Frequency of Communication with Friends and Family:   . Frequency of Social Gatherings  with Friends and Family:   . Attends Religious Services:   . Active Member of Clubs or Organizations:   . Attends Archivist Meetings:   Marland Kitchen Marital Status:    Family History  Problem Relation Age of Onset  . Alcohol abuse Father   . Cancer Father   . Breast cancer Neg Hx    Allergies  Allergen Reactions  . Ciprofloxacin Swelling    Lips swell, tongue swells, face swells  . Citalopram Other (See Comments)    Possible cause of pancytopenia. Swelling of tongue, face and throat  . Lamotrigine Other (See Comments)    Possible cause of pancytopenia. Swelling of face, throat and tongue  . Sulfa  Antibiotics Anaphylaxis  . Tramadol Anaphylaxis, Shortness Of Breath and Swelling   Prior to Admission medications   Medication Sig Start Date End Date Taking? Authorizing Provider  acetaminophen (TYLENOL) 500 MG tablet Take 1-2 tablets (500-1,000 mg total) by mouth every 6 (six) hours as needed for moderate pain. 07/30/19  Yes Cross, Melissa D, NP  Ferrous Sulfate (IRON) 325 (65 Fe) MG TABS Take 1 tablet by mouth every other day. 02/03/20  Yes [provider]  ibuprofen (ADVIL) 600 MG tablet Take 1 tablet (600 mg total) by mouth every 6 (six) hours as needed for moderate pain. 02/09/20  Yes Cross, Melissa D, NP  lurasidone (LATUDA) 20 MG TABS tablet Take 20 mg by mouth daily.   Yes [provider]  OXcarbazepine (TRILEPTAL) 150 MG tablet Take 75 mg by mouth in the morning and at bedtime. 03/09/20  Yes [provider]  oxybutynin (DITROPAN-XL) 10 MG 24 hr tablet Take 10 mg by mouth daily. 03/31/20  Yes [provider]  Oxycodone HCl 10 MG TABS Take 10 mg by mouth 2 (two) times daily as needed (pain).  01/07/19  Yes [provider]  Vitamin D, Ergocalciferol, (DRISDOL) 1.25 MG (50000 UT) CAPS capsule Take 50,000 Units by mouth once a week. 02/01/19  Yes [provider]  estradiol (CLIMARA - DOSED IN MG/24 HR) 0.05 mg/24hr patch Place 1 patch (0.05 mg total) onto the skin once a week. 01/24/20   Everitt Amber, MD  oxyCODONE (OXY IR/ROXICODONE) 5 MG immediate release tablet Take 1 tablet (5 mg total) by mouth every 4 (four) hours as needed for severe pain or breakthrough pain. Patient not taking: Reported on 04/09/2020 09/30/19   Gery Pray, MD   DG Tibia/Fibula Right Port  Result Date: 04/09/2020 CLINICAL DATA:  Open fracture EXAM: PORTABLE RIGHT TIBIA AND FIBULA - 2 VIEW COMPARISON:  None. FINDINGS: Acute comminuted fracture of the distal tibial diaphysis with 1.4 cm of posterior displacement and varus angulation. Multiple comminuted tibial fracture  fragments, the 2 largest measuring up to 4.6 cm and 2.7 cm. There is also an acute fracture of the distal fibular diaphysis with 1.0 cm of lateral displacement and 0.5 cm of anterior displacement as well as a similar degree of varus angulation. There is an additional obliquely oriented nondisplaced fracture of the proximal fibular diaphysis. Alignment at the knee and ankle appear anatomic on large field-of-view image. There is prominent soft tissue swelling at the fracture site. IMPRESSION: 1. Acute comminuted angulated fractures of the distal tibial and fibular diaphyses as described above. 2. Nondisplaced obliquely oriented fracture of the proximal fibular diaphysis. Electronically Signed   By: Davina Poke D.O.   On: 04/09/2020 11:47   Family History Reviewed and non-contributory, no pertinent history of problems with bleeding or anesthesia  Review of Systems 14 system ROS conducted and negative except for that noted in HPI   OBJECTIVE  Vitals: Patient Vitals for the past 8 hrs:  BP Temp Temp src Pulse Resp SpO2  04/09/20 1315 104/79 -- -- 76 (!) 26 98 %  04/09/20 1231 (!) 132/44 -- -- 85 18 99 %  04/09/20 1226 (!) 150/136 -- -- 80 16 98 %  04/09/20 1221 -- -- -- 75 17 100 %  04/09/20 1216 -- -- -- 83 -- 98 %  04/09/20 1211 -- -- -- 80 -- 100 %  04/09/20 1201 (!) 130/97 -- -- 70 -- 98 %  04/09/20 1151 -- -- -- 74 -- 99 %  04/09/20 1146 134/82 -- -- 83 -- 96 %  04/09/20 1141 -- -- -- 71 -- 98 %  04/09/20 1131 (!) 124/96 -- -- 83 -- 100 %  04/09/20 1121 -- -- -- 76 -- 100 %  04/09/20 1116 124/79 -- -- 72 -- 98 %  04/09/20 1111 -- -- -- 72 -- 100 %  04/09/20 1101 101/76 -- -- 67 -- 100 %  04/09/20 1051 -- -- -- 79 -- 100 %  04/09/20 1046 117/81 -- -- 71 -- 99 %  04/09/20 1044 -- -- -- 66 -- 99 %  04/09/20 1041 -- -- -- 69 -- 100 %  04/09/20 1034 -- -- -- 70 -- 100 %  04/09/20 1031 (!) 119/100 -- -- 74 -- 100 %  04/09/20 1024 -- -- -- 74 -- 98 %  04/09/20 1021 -- -- -- 72  -- 99 %  04/09/20 1016 115/74 -- -- 67 -- 98 %  04/09/20 1014 (!) 130/98 -- -- 72 -- 99 %  04/09/20 1011 -- -- -- 65 -- 95 %  04/09/20 1004 -- -- -- 66 -- 95 %  04/09/20 1001 (!) 160/91 97.9 F (36.6 C) Oral 66 16 98 %  04/09/20 0954 -- -- -- 72 -- 97 %   General: Alert, no acute distress Cardiovascular: Warm extremities noted Respiratory: No cyanosis, no use of accessory musculature GI: No organomegaly, abdomen is soft and non-tender Skin: No lesions in the area of chief complaint other than those listed below in MSK exam.  Neurologic: Sensation intact distally save for the below mentioned MSK exam Psychiatric: Patient is competent for consent with normal mood and affect Lymphatic: No swelling obvious and reported other than the area involved in the exam below Extremities  Right lower extremity: Open wound with bandages in place and compressive dressing.  Ankle is clearly deformed.  Warm well-perfused toes with intact DP/PT pulses.  Intact GSC/EHL/TA.  Test Results Imaging Imaging demonstrates a complex comminuted distal tibia and fibula fracture that may enter the joint on one view.  Labs cbc Recent Labs    04/09/20 1036  WBC 7.8  HGB 13.7  HCT 40.8  PLT 159    Labs inflam No results for input(s): CRP in the last 72 hours.  Invalid input(s): ESR  Labs coag No results for input(s): INR, PTT in the last 72 hours.  Invalid input(s): PT  Recent Labs    04/09/20 1036  NA 139  K 4.5  CL 106  CO2 25  GLUCOSE 115*  BUN 11  CREATININE 0.71  CALCIUM 8.9     ASSESSMENT AND PLAN: 54 y.o. female with the following: Grade 2 open distal tibia and fibula fracture.  Patient requires emergent management.  We will plan for washout and exfix due to the complex  nature of her fracture.  We will consult one of my colleagues who specializes in distal tibial fracture management.  We will temporize the patient now and plan for inpatient management until she is stable for  surgery.  We will manage the patient inpatient and if she requires it we may get a hospitalist consult for further management.  All questions were answered.  We talked about specific risk including infection, need for further procedures, wound site infections and nonunion and malunion.  Patient elected proceed with surgery.

## 2020-04-09 NOTE — Transfer of Care (Signed)
Immediate Anesthesia Transfer of Care Note  Patient: Jordan [name]  Procedure(s) Performed: EXTERNAL FIXATION LEG (Right Leg Lower)  Patient Location: PACU  Anesthesia Type:General  Level of Consciousness: awake, alert  and patient cooperative  Airway & Oxygen Therapy: Patient Spontanous Breathing  Post-op Assessment: Report given to RN and Post -op Vital signs reviewed and stable  Post vital signs: Reviewed and stable  Last Vitals:  Vitals Value Taken Time  BP 89/49 04/09/20 1528  Temp 36.6 C 04/09/20 1525  Pulse 61 04/09/20 1532  Resp 15 04/09/20 1532  SpO2 96 % 04/09/20 1532  Vitals shown include unvalidated device data.  Last Pain:  Vitals:   04/09/20 1525  TempSrc:   PainSc: 8       Patients Stated Pain Goal: 0 (00/34/91 7915)  Complications: No complications documented.

## 2020-04-09 NOTE — ED Notes (Signed)
Note/cms intact all toes bilat., including light touch.

## 2020-04-09 NOTE — Anesthesia Postprocedure Evaluation (Signed)
Anesthesia Post Note  Patient: Jordan [name]  Procedure(s) Performed: EXTERNAL FIXATION LEG (Right Leg Lower)     Patient location during evaluation: PACU Anesthesia Type: General Level of consciousness: awake and alert Pain management: pain level controlled Vital Signs Assessment: post-procedure vital signs reviewed and stable Respiratory status: spontaneous breathing, nonlabored ventilation, respiratory function stable and patient connected to nasal cannula oxygen Cardiovascular status: blood pressure returned to baseline and stable Postop Assessment: no apparent nausea or vomiting Anesthetic complications: no   No complications documented.  Last Vitals:  Vitals:   04/09/20 1852 04/09/20 2005  BP: 124/76 (!) 144/58  Pulse: 69 71  Resp: 14 16  Temp: 36.8 C 36.8 C  SpO2:  99%    Last Pain:  Vitals:   04/09/20 2145  TempSrc:   PainSc: [place]

## 2020-04-09 NOTE — ED Triage Notes (Signed)
As she was visiting her boyfriend, who currently resides at a local nursing home, she slipped on a lippery floor, thereby injuring her right distal tib. Fib. The distal tib. Fib. Appears to be an open fracture. She rec'd. 100 mcg of IV Fentanyl and 25 mcg of IV Ketamine en route to hospital. Her leg has a dressing and is cradled in a foam splint.

## 2020-04-09 NOTE — Progress Notes (Signed)
Ortho Trauma Note  I reviewed case with Dr. Griffin Basil. Will need formal ORIF vs IMN. Will make NPO past midnight and post tentatively for tomorrow pending soft tissue swelling. Ortho trauma consult to follow first thing tomorrow morning.  Shona Needles, MD [facility] [phone] (office) [email or web address]

## 2020-04-09 NOTE — Plan of Care (Signed)
  Problem: Education: Goal: Knowledge of General Education information will improve Description Including pain rating scale, medication(s)/side effects and non-pharmacologic comfort measures Outcome: Progressing   Problem: Health Behavior/Discharge Planning: Goal: Ability to manage health-related needs will improve Outcome: Progressing   

## 2020-04-09 NOTE — ED Provider Notes (Signed)
Pt transferred from [facility] ED for open tib/fib fx, Dr. Griffin Basil to see. On arrival, VSS, pt complaining of pain therefore ordered fentanyl. R leg in splint with bandage over distal open fracture site, foot warm and well perfused. Pt will be taken to OR for operative repair.    Jordan [name], Jordan Overland, MD 04/09/20 1319

## 2020-04-09 NOTE — Interval H&P Note (Signed)
History and Physical Interval Note:  04/09/2020 1:55 PM  Jordan [name]  has presented today for surgery, with the diagnosis of FALL.  The various methods of treatment have been discussed with the patient and family. After consideration of risks, benefits and other options for treatment, the patient has consented to  Procedure(s): EXTERNAL FIXATION LEG (N/A) as a surgical intervention.  The patient's history has been reviewed, patient examined, no change in status, stable for surgery.  I have reviewed the patient's chart and labs.  Questions were answered to the patient's satisfaction.     Hiram Gash

## 2020-04-09 NOTE — ED Provider Notes (Signed)
[place] DEPT Provider Note   CSN: [id number] Arrival date & time: 04/09/20  1751     History Chief Complaint  Patient presents with  . Fall  . Leg Injury    Jordan [name] is a 54 y.o. female with pertinent past medical history of bipolar 1, that presents via EMS for fall.  States that she was visiting her boyfriend in a nursing home and slipped on wet floors, states that she slipped and hit her leg coming down.  Right leg.  States that she heard a popping noise when she fell to the floor.  EMS brought her in and noticed a open fracture of distal tibia.  They gave her 100 mics of fentanyl, 25 mics of ketamine and 2 g of Ancef en route.  They were able to cradle and splint the leg.  Patient admits to extreme right leg pain, denies pain elsewhere.  Denies hitting her head, back or any other parts of her body.  Denies any LOC.  Is not on any blood thinners.  Has never had any injuries previous to that leg before.  Last time she had anything to eat or drink was 7 AM. No alcohol ob board.  HPI     Past Medical History:  Diagnosis Date  . Alcohol abuse   . Anemia    patient denies  . Bipolar 1 disorder (HCC)    No medications currently  . CAP (community acquired pneumonia) 03/17/2015  . 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 Mental disorder   . Obesity   . PICC line infection 05/17/2015  . Sepsis due to Gram negative bacteria (MDR E Coli) 02/18/2015  . 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 UTI (lower urinary tract infection)   . Vaginal Pap smear, abnormal     Patient Active Problem List   Diagnosis Date Noted  . Vaginal cancer ([place]) 07/22/2019  . Morbid obesity with BMI of 40.0-44.9, adult ([place]) 06/11/2019  . 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 Visit for routine gyn exam 04/21/2019  . Vaginal atrophy 04/21/2019  . Painful lumpy right breast 07/27/2016  . Urinary tract infection 07/01/2015  .  Diastolic dysfunction 02/58/5277  . Constipation 05/12/2015  . ESBL (extended spectrum beta-lactamase) producing bacteria infection 03/17/2015  . Diarrhea 03/03/2015  . Weakness 03/02/2015  . 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 Generalized anxiety disorder 02/20/2015  . Claustrophobia 02/20/2015  . Transaminitis 02/18/2015  . Chest pain 02/18/2015  . Abdominal pain   . Dizziness   . Hypotension 01/04/2015  . Bipolar disorder ([place]) 06/29/2012    Past Surgical History:  Procedure Laterality Date  . ABDOMINAL HYSTERECTOMY    . CERVICAL CONIZATION W/BX N/A 07/01/2016   Procedure: CONIZATION CERVIX WITH BIOPSY;  Surgeon: Chancy Milroy, MD;  Location: [facility];  Service: Gynecology;  Laterality: N/A;  . CHOLECYSTECTOMY    . HYSTEROSCOPY WITH D & C N/A 01/25/2016   Procedure: DILATATION AND CURETTAGE /HYSTEROSCOPY;  Surgeon: Mora Bellman, MD;  Location: [facility];  Service: Gynecology;  Laterality: N/A;  . LYMPH NODE BIOPSY Bilateral 07/22/2019   Procedure: LYMPH NODE BIOPSY;  Surgeon: Everitt Amber, MD;  Location: [facility];  Service: Gynecology;  Laterality: Bilateral;  . ROBOT ASSISTED MYOMECTOMY N/A 07/22/2019   Procedure: XI ROBOTIC ASSISTED LAPAROSCOPIC RADICAL UPPER VAGINECTOMY, LEFT SALPINECTOMY, RIGHT SALPINGOOOPHERECTOMY;  Surgeon: Everitt Amber, MD;  Location: [facility];  Service: Gynecology;  Laterality: N/A;  . VAGINAL HYSTERECTOMY N/A  03/11/2017   Procedure: HYSTERECTOMY VAGINAL WITH MORCELLATION;  Surgeon: Chancy Milroy, MD;  Location: [facility];  Service: Gynecology;  Laterality: N/A;     OB History    Gravida  1   Para  1   Term  1   Preterm      AB      Living  1     SAB      TAB      Ectopic      Multiple      Live Births  1           Family History  Problem Relation Age of Onset  . Alcohol abuse Father   . Cancer Father   . Breast cancer Neg Hx     Social History   Tobacco Use  . Smoking status: Never Smoker  . Smokeless tobacco: Never Used  Vaping  Use  . Vaping Use: Never used  Substance Use Topics  . Alcohol use: Not Currently  . Drug use: No    Home Medications Prior to Admission medications   Medication Sig Start Date End Date Taking? Authorizing Provider  acetaminophen (TYLENOL) 500 MG tablet Take 1-2 tablets (500-1,000 mg total) by mouth every 6 (six) hours as needed for moderate pain. 07/30/19  Yes Cross, Melissa D, NP  Ferrous Sulfate (IRON) 325 (65 Fe) MG TABS Take 1 tablet by mouth every other day. 02/03/20  Yes [provider]  ibuprofen (ADVIL) 600 MG tablet Take 1 tablet (600 mg total) by mouth every 6 (six) hours as needed for moderate pain. 02/09/20  Yes Cross, Melissa D, NP  lurasidone (LATUDA) 20 MG TABS tablet Take 20 mg by mouth daily.   Yes [provider]  OXcarbazepine (TRILEPTAL) 150 MG tablet Take 75 mg by mouth in the morning and at bedtime. 03/09/20  Yes [provider]  oxybutynin (DITROPAN-XL) 10 MG 24 hr tablet Take 10 mg by mouth daily. 03/31/20  Yes [provider]  Oxycodone HCl 10 MG TABS Take 10 mg by mouth 2 (two) times daily as needed (pain).  01/07/19  Yes [provider]  Vitamin D, Ergocalciferol, (DRISDOL) 1.25 MG (50000 UT) CAPS capsule Take 50,000 Units by mouth once a week. 02/01/19  Yes [provider]  estradiol (CLIMARA - DOSED IN MG/24 HR) 0.05 mg/24hr patch Place 1 patch (0.05 mg total) onto the skin once a week. 01/24/20   Everitt Amber, MD  oxyCODONE (OXY IR/ROXICODONE) 5 MG immediate release tablet Take 1 tablet (5 mg total) by mouth every 4 (four) hours as needed for severe pain or breakthrough pain. Patient not taking: Reported on 04/09/2020 09/30/19   Gery Pray, MD    Allergies    Ciprofloxacin, Citalopram, Lamotrigine, Sulfa antibiotics, and Tramadol  Review of Systems   Review of Systems  Constitutional: Negative for chills, diaphoresis, fatigue and fever.  HENT: Negative for congestion, sore throat and trouble swallowing.   Eyes:  Negative for pain and visual disturbance.  Respiratory: Negative for cough, shortness of breath and wheezing.   Cardiovascular: Negative for chest pain, palpitations and leg swelling.  Gastrointestinal: Negative for abdominal distention, abdominal pain, diarrhea, nausea and vomiting.  Genitourinary: Negative for difficulty urinating.  Musculoskeletal: Positive for arthralgias (Right leg pain). Negative for back pain, neck pain and neck stiffness.  Skin: Negative for pallor.  Neurological: Negative for dizziness, speech difficulty, weakness and headaches.  Psychiatric/Behavioral: Negative for confusion.    Physical Exam Updated Vital Signs BP Marland Kitchen)  132/44   Pulse 85   Temp 97.9 F (36.6 C) (Oral)   Resp 18   LMP  (LMP Unknown)   SpO2 99%   Physical Exam Constitutional:      General: She is not in acute distress.    Appearance: Normal appearance. She is not ill-appearing, toxic-appearing or diaphoretic.  HENT:     Head: Normocephalic and atraumatic.     Mouth/Throat:     Mouth: Mucous membranes are moist.     Pharynx: Oropharynx is clear.  Eyes:     General: No scleral icterus.    Extraocular Movements: Extraocular movements intact.     Pupils: Pupils are equal, round, and reactive to light.  Cardiovascular:     Rate and Rhythm: Normal rate and regular rhythm.     Pulses: Normal pulses.     Heart sounds: Normal heart sounds.  Pulmonary:     Effort: Pulmonary effort is normal. No respiratory distress.     Breath sounds: Normal breath sounds. No stridor. No wheezing, rhonchi or rales.  Chest:     Chest wall: No tenderness.  Abdominal:     General: Abdomen is flat. There is no distension.     Palpations: Abdomen is soft.     Tenderness: There is no abdominal tenderness. There is no guarding or rebound.  Musculoskeletal:        General: Tenderness, deformity and signs of injury present. No swelling. Normal range of motion.     Cervical back: Normal range of motion and neck  supple. No rigidity.     Right lower leg: No edema.     Left lower leg: No edema.     Comments: Patient with open right fracture, most likely fibula and tibia.  Bleeding is being controlled with direct pressure.  Patient in extreme pain over overlying fracture.  No overlying skin changes.  Doppler was used and PT pulses were 2+ and strong.  Foot is warm and pink.  Cap refill less than 2 seconds on right side.  Normal sensation throughout, is able to move all toes.  Left side without any abnormalities.  No tenderness to right knee or right hip.  Try to assess back, patient did not want to move for this.  Patient did not have any tenderness to areas of back that I assessed.  Skin:    General: Skin is warm and dry.     Capillary Refill: Capillary refill takes less than 2 seconds.     Coloration: Skin is not pale.  Neurological:     General: No focal deficit present.     Mental Status: She is alert and oriented to person, place, and time.  Psychiatric:        Mood and Affect: Mood normal.        Behavior: Behavior normal.     ED Results / Procedures / Treatments   Labs (all labs ordered are listed, but only abnormal results are displayed) Labs Reviewed  COMPREHENSIVE METABOLIC PANEL - Abnormal; Notable for the following components:      Result Value   Glucose, Bld 115 (*)    All other components within normal limits  SARS CORONAVIRUS 2 BY RT PCR (HOSPITAL ORDER, [facility])  CBC WITH DIFFERENTIAL/PLATELET  I-STAT BETA HCG BLOOD, ED ([facility], [facility], [facility] ONLY)  TYPE AND SCREEN    EKG None  Radiology DG Tibia/Fibula Right Port  Result Date: 04/09/2020 CLINICAL DATA:  Open fracture EXAM: PORTABLE RIGHT TIBIA  AND FIBULA - 2 VIEW COMPARISON:  None. FINDINGS: Acute comminuted fracture of the distal tibial diaphysis with 1.4 cm of posterior displacement and varus angulation. Multiple comminuted tibial fracture fragments, the 2 largest measuring up to 4.6 cm and 2.7 cm.  There is also an acute fracture of the distal fibular diaphysis with 1.0 cm of lateral displacement and 0.5 cm of anterior displacement as well as a similar degree of varus angulation. There is an additional obliquely oriented nondisplaced fracture of the proximal fibular diaphysis. Alignment at the knee and ankle appear anatomic on large field-of-view image. There is prominent soft tissue swelling at the fracture site. IMPRESSION: 1. Acute comminuted angulated fractures of the distal tibial and fibular diaphyses as described above. 2. Nondisplaced obliquely oriented fracture of the proximal fibular diaphysis. Electronically Signed   By: Davina Poke D.O.   On: 04/09/2020 11:47    Procedures Procedures (including critical care time)  Medications Ordered in ED Medications  ceFAZolin (ANCEF) IVPB 1 g/50 mL premix (1 g Intravenous New Bag/Given 04/09/20 1235)  fentaNYL (SUBLIMAZE) 100 MCG/2ML injection (has no administration in time range)  fentaNYL (SUBLIMAZE) injection 50 mcg (50 mcg Intravenous Given 04/09/20 1041)  ondansetron (ZOFRAN) injection 4 mg (4 mg Intravenous Given 04/09/20 1041)  Tdap (BOOSTRIX) injection 0.5 mL (0.5 mLs Intramuscular Given 04/09/20 1236)  fentaNYL (SUBLIMAZE) injection 50 mcg (50 mcg Intravenous Given 04/09/20 1239)    ED Course  I have reviewed the triage vital signs and the nursing notes.  Pertinent labs & imaging results that were available during my care of the patient were reviewed by me and considered in my medical decision making (see chart for details).    MDM Rules/Calculators/A&P                         Patient with open distal tib-fib fracture, will consult Ortho surgery at this time.  Patient was given 3 g of Ancef, tetanus, pain is being controlled with fentanyl.  Patient is neuro vascularly distally intact. Spoke to Dr. Griffin Basil, orthopedic surgery who accepts the patient, states that she will need emergent surgery today.  Patient to be transferred by  [facility].  He said he will admit the patient. Dr. Rex Kras at [facility] aware. Patient is stable at this moment, splint in place.  Patient remains to be distally neurovascularly intact.  Patient's pain being controlled by fentanyl.  I discussed this case with my attending physician who cosigned this note including patient's presenting symptoms, physical exam, and planned diagnostics and interventions. Attending physician stated agreement with plan or made changes to plan which were implemented.   Attending physician assessed patient at bedside.  Final Clinical Impression(s) / ED Diagnoses Final diagnoses:  Fall  Type I or II open fracture of distal end of right tibia, unspecified fracture morphology, initial encounter    Rx / DC Orders ED Discharge Orders    None       Alfredia Client, PA-C 04/09/20 1250    Malvin Johns, MD 04/09/20 1328    Malvin Johns, MD 04/09/20 1329

## 2020-04-09 NOTE — Op Note (Signed)
Orthopaedic Surgery Operative Note (CSN: [id number])  Jordan [name]  [birth year] Date of Surgery: 04/09/2020   Diagnoses:  RIGHT OPEN TIBIA AND FIBULA FRACTURE  Procedure: Right tibia and fibula external fixation Right open fracture incision and debridement   Operative Finding Successful completion of the planned procedure.  Patient's bone quality felt relatively poor and there was cortical bone loss with 2 small fragments about 1 x 3 cm in size that were completely stripped of soft tissue.  These were removed.  A careful debridement was performed.  Patient will need definitive fixation and 1 to 3 days likely with a orthopedic traumatologist or foot and ankle specialist.  Post-operative plan: The patient will be nonweightbearing in an external fixator with a Prevena dressing and KCI wound VAC.  The patient will be readmitted to the floor.  DVT prophylaxis Lovenox 40 mg/day until mobilizing and then consider transition in clinic to alternative medicines.   Pain control with PRN pain medication preferring oral medicines.  Follow up plan to be determined as the patient will require another surgery.  Open fracture protocol use it for antibiotics after surgery.  Post-Op Diagnosis: Same Surgeons:Primary: Hiram Gash, MD Assistants:Caroline McBane PA-C Location: [facility] OR ROOM 05 Anesthesia: General Antibiotics: 3 g Ancef with 1 g of local vancomycin and 1.2 g of tobramycin powder. Tourniquet time: * Missing tourniquet times found for documented tourniquets in log: [id number] * Estimated Blood Loss: Minimal Complications: None Specimens: None Implants: * No implants in log *  Indications for Surgery:   Jordan [name] is a 54 y.o. female with fall resulting in an open tibia and fibula fracture on the right.  We took the patient emergently to the operating room due to the comminuted nature of her open fracture.  Benefits and risks of operative and nonoperative management were discussed prior to surgery  with patient/guardian(s) and informed consent form was completed.  Specific risks including infection, need for additional surgery, nonunion, malunion, pin site issues, extensive rehab and nonweightbearing status amongst others   Procedure:   The patient was identified properly. Informed consent was obtained and the surgical site was marked. The patient was taken up to suite where general anesthesia was induced.  The patient was positioned supine on a regular bed.  The right tibia was prepped and draped in the usual sterile fashion.  Timeout was performed before the beginning of the case.  We began by extending the anterior lateral traumatic incision that was 2 cm in length.  We extended it proximally and distally taking care to avoid the area where the neurovascular bundle would lay.  We dissected bluntly through deep tissues after going through skin sharply.  We were able to identify the fracture fragments and is clearly an inside out type fracture with the proximal aspect of the tibia likely delivered through the incision.  We used a rondure and manual dissection to excisionally debride skin, deep tissue, stripped bone.  We irrigated with copious normal saline before proceeding to close the incision after placement of local vancomycin and tobramycin powder.  This point a delta frame was created with 2 half pins in the tibia achieving bicortical purchase.  We verified our position on fluoroscopy.  We then proceeded to the ankle and identified with fluoroscopy appropriate sites for 2 transfixion pins to control dorsiflexion plantarflexion of the foot as well as our reduction.  These were placed without issue in a percutaneous fashion using a hemostat to spread down to bone to avoid damage to surrounding  neurovascular structures.  We also performed these pins placement with a sleeve in place to protect the soft tissues.  Now that all pins were adequately positioned we used fluoroscopy to guide our  reduction and created a delta frame with a posterior kickstand to keep the foot off the bed.  Final fluoroscopic images demonstrated an appropriate reduction of the fracture.  We placed a Prevena wound VAC at the end of the case.  Jordan Chapel, PA-C, present and scrubbed throughout the case, critical for completion in a timely fashion, and for retraction, instrumentation, closure.

## 2020-04-09 NOTE — Anesthesia Preprocedure Evaluation (Signed)
Anesthesia Evaluation  Patient identified by MRN, date of birth, ID band Patient awake    Reviewed: Allergy & Precautions, H&P , NPO status , Patient's Chart, lab work & pertinent test results  Airway Mallampati: II   Neck ROM: full    Dental   Pulmonary    breath sounds clear to auscultation       Cardiovascular negative cardio ROS   Rhythm:regular Rate:Normal     Neuro/Psych PSYCHIATRIC DISORDERS Anxiety Bipolar Disorder    GI/Hepatic (+)     substance abuse  alcohol use,   Endo/Other    Renal/GU      Musculoskeletal   Abdominal   Peds  Hematology   Anesthesia Other Findings   Reproductive/Obstetrics                             Anesthesia Physical Anesthesia Plan  ASA: II  Anesthesia Plan: General   Post-op Pain Management:    Induction: Intravenous  PONV Risk Score and Plan: 3 and Ondansetron, Dexamethasone, Midazolam and Treatment may vary due to age or medical condition  Airway Management Planned: Oral ETT  Additional Equipment:   Intra-op Plan:   Post-operative Plan: Extubation in OR  Informed Consent: I have reviewed the patients History and Physical, chart, labs and discussed the procedure including the risks, benefits and alternatives for the proposed anesthesia with the patient or authorized representative who has indicated his/her understanding and acceptance.       Plan Discussed with: CRNA, Anesthesiologist and Surgeon  Anesthesia Plan Comments:         Anesthesia Quick Evaluation

## 2020-04-10 ENCOUNTER — Other Ambulatory Visit: Payer: Self-pay

## 2020-04-10 ENCOUNTER — Inpatient Hospital Stay (HOSPITAL_COMMUNITY): Payer: Medicaid Other | Admitting: Certified Registered"

## 2020-04-10 ENCOUNTER — Encounter (HOSPITAL_COMMUNITY)
Admission: EM | Disposition: A | Discharge status: Rehabilitation Facility | Payer: Self-pay | Source: Home / Self Care | Attending: Student

## 2020-04-10 ENCOUNTER — Inpatient Hospital Stay (HOSPITAL_COMMUNITY): Payer: Medicaid Other

## 2020-04-10 ENCOUNTER — Encounter (HOSPITAL_COMMUNITY): Payer: Self-pay | Admitting: Orthopaedic Surgery

## 2020-04-10 DIAGNOSIS — S8251XA Displaced fracture of medial malleolus of right tibia, initial encounter for closed fracture: Secondary | ICD-10-CM

## 2020-04-10 HISTORY — PX: OPEN REDUCTION INTERNAL FIXATION (ORIF) TIBIA/FIBULA FRACTURE: SHX5992

## 2020-04-10 LAB — CBC
HCT: 35.8 % — ABNORMAL LOW (ref 36.0–46.0)
Hemoglobin: 12.8 g/dL (ref 12.0–15.0)
MCH: 33.8 pg (ref 26.0–34.0)
MCHC: 35.8 g/dL (ref 30.0–36.0)
MCV: 94.5 fL (ref 80.0–100.0)
Platelets: 179 10*3/uL (ref 150–400)
RBC: 3.79 MIL/uL — ABNORMAL LOW (ref 3.87–5.11)
RDW: 13.2 % (ref 11.5–15.5)
WBC: 8.8 10*3/uL (ref 4.0–10.5)
nRBC: 0 % (ref 0.0–0.2)

## 2020-04-10 LAB — BASIC METABOLIC PANEL
Anion gap: 9 (ref 5–15)
BUN: 9 mg/dL (ref 6–20)
CO2: 24 mmol/L (ref 22–32)
Calcium: 8.8 mg/dL — ABNORMAL LOW (ref 8.9–10.3)
Chloride: 105 mmol/L (ref 98–111)
Creatinine, Ser: 0.81 mg/dL (ref 0.44–1.00)
GFR calc Af Amer: >60 mL/min (ref >60)
GFR calc non Af Amer: >60 mL/min (ref >60)
Glucose, Bld: 119 mg/dL — ABNORMAL HIGH (ref 70–99)
Potassium: 4.4 mmol/L (ref 3.5–5.1)
Sodium: 138 mmol/L (ref 135–145)

## 2020-04-10 SURGERY — OPEN REDUCTION INTERNAL FIXATION (ORIF) TIBIA/FIBULA FRACTURE
Anesthesia: General | Site: Leg Lower | Laterality: Right

## 2020-04-10 MED ORDER — PROPOFOL 10 MG/ML IV BOLUS
INTRAVENOUS | Status: DC | PRN
  Administered 2020-04-10: 180 mg via INTRAVENOUS

## 2020-04-10 MED ORDER — ONDANSETRON HCL 4 MG/2ML IJ SOLN
INTRAMUSCULAR | Status: AC
  Filled 2020-04-10: qty 2

## 2020-04-10 MED ORDER — LACTATED RINGERS IV SOLN: INTRAVENOUS | Status: DC

## 2020-04-10 MED ORDER — 0.9 % SODIUM CHLORIDE (POUR BTL) OPTIME
TOPICAL | Status: DC | PRN
  Administered 2020-04-10: 1000 mL

## 2020-04-10 MED ORDER — VANCOMYCIN HCL 1000 MG IV SOLR
INTRAVENOUS | Status: DC | PRN
  Administered 2020-04-10: 1000 mg via TOPICAL

## 2020-04-10 MED ORDER — DEXMEDETOMIDINE HCL 200 MCG/2ML IV SOLN
INTRAVENOUS | Status: DC | PRN
  Administered 2020-04-10 (×2): 20 ug via INTRAVENOUS

## 2020-04-10 MED ORDER — MEPERIDINE HCL 25 MG/ML IJ SOLN: 6.2500 mg | INTRAMUSCULAR | Status: DC | PRN

## 2020-04-10 MED ORDER — EPHEDRINE SULFATE-NACL 50-0.9 MG/10ML-% IV SOSY
PREFILLED_SYRINGE | INTRAVENOUS | Status: DC | PRN
  Administered 2020-04-10: 5 mg via INTRAVENOUS

## 2020-04-10 MED ORDER — TOBRAMYCIN SULFATE 1.2 G IJ SOLR
INTRAMUSCULAR | Status: AC
  Filled 2020-04-10: qty 1.2

## 2020-04-10 MED ORDER — DEXAMETHASONE SODIUM PHOSPHATE 10 MG/ML IJ SOLN
INTRAMUSCULAR | Status: AC
  Filled 2020-04-10: qty 1

## 2020-04-10 MED ORDER — HYDROMORPHONE HCL 1 MG/ML IJ SOLN
INTRAMUSCULAR | Status: AC
  Administered 2020-04-11: 1 mg via INTRAVENOUS
  Filled 2020-04-10: qty 1

## 2020-04-10 MED ORDER — SUGAMMADEX SODIUM 200 MG/2ML IV SOLN
INTRAVENOUS | Status: DC | PRN
  Administered 2020-04-10: 400 mg via INTRAVENOUS

## 2020-04-10 MED ORDER — FENTANYL CITRATE (PF) 100 MCG/2ML IJ SOLN
INTRAMUSCULAR | Status: DC | PRN
  Administered 2020-04-10 (×2): 50 ug via INTRAVENOUS
  Administered 2020-04-10: 100 ug via INTRAVENOUS
  Administered 2020-04-10: 50 ug via INTRAVENOUS

## 2020-04-10 MED ORDER — KETAMINE HCL 10 MG/ML IJ SOLN
INTRAMUSCULAR | Status: DC | PRN
Start: 2020-04-10 — End: 2020-04-10
  Administered 2020-04-10: 20 mg via INTRAVENOUS
  Administered 2020-04-10: 10 mg via INTRAVENOUS

## 2020-04-10 MED ORDER — DEXAMETHASONE SODIUM PHOSPHATE 10 MG/ML IJ SOLN
INTRAMUSCULAR | Status: DC | PRN
  Administered 2020-04-10: 8 mg via INTRAVENOUS

## 2020-04-10 MED ORDER — CEFAZOLIN SODIUM 1 G IJ SOLR
INTRAMUSCULAR | Status: AC
  Filled 2020-04-10: qty 10

## 2020-04-10 MED ORDER — MIDAZOLAM HCL 2 MG/2ML IJ SOLN
INTRAMUSCULAR | Status: AC
  Filled 2020-04-10: qty 2

## 2020-04-10 MED ORDER — HYDROMORPHONE HCL 1 MG/ML IJ SOLN
1.0000 mg | INTRAMUSCULAR | Status: DC | PRN
  Administered 2020-04-10 – 2020-04-14 (×6): 1 mg via INTRAVENOUS
  Filled 2020-04-10 (×8): qty 1

## 2020-04-10 MED ORDER — CEFAZOLIN SODIUM-DEXTROSE 2-4 GM/100ML-% IV SOLN
2.0000 g | Freq: Three times a day (TID) | INTRAVENOUS | Status: AC
  Administered 2020-04-10 – 2020-04-11 (×3): 2 g via INTRAVENOUS
  Filled 2020-04-10 (×3): qty 100

## 2020-04-10 MED ORDER — CHLORHEXIDINE GLUCONATE 0.12 % MT SOLN
15.0000 mL | Freq: Once | OROMUCOSAL | Status: AC
  Filled 2020-04-10: qty 15

## 2020-04-10 MED ORDER — LACTATED RINGERS IV SOLN: INTRAVENOUS | Status: DC | PRN

## 2020-04-10 MED ORDER — HYDROMORPHONE HCL 1 MG/ML IJ SOLN
INTRAMUSCULAR | Status: AC
  Filled 2020-04-10: qty 1

## 2020-04-10 MED ORDER — PHENYLEPHRINE 40 MCG/ML (10ML) SYRINGE FOR IV PUSH (FOR BLOOD PRESSURE SUPPORT)
PREFILLED_SYRINGE | INTRAVENOUS | Status: DC | PRN
  Administered 2020-04-10: 120 ug via INTRAVENOUS
  Administered 2020-04-10: 80 ug via INTRAVENOUS

## 2020-04-10 MED ORDER — ROCURONIUM BROMIDE 10 MG/ML (PF) SYRINGE
PREFILLED_SYRINGE | INTRAVENOUS | Status: DC | PRN
  Administered 2020-04-10 (×2): 10 mg via INTRAVENOUS
  Administered 2020-04-10: 60 mg via INTRAVENOUS

## 2020-04-10 MED ORDER — ONDANSETRON HCL 4 MG/2ML IJ SOLN: 4.0000 mg | Freq: Once | INTRAMUSCULAR | Status: DC | PRN

## 2020-04-10 MED ORDER — FENTANYL CITRATE (PF) 250 MCG/5ML IJ SOLN
INTRAMUSCULAR | Status: AC
  Filled 2020-04-10: qty 5

## 2020-04-10 MED ORDER — TOBRAMYCIN SULFATE 1.2 G IJ SOLR
INTRAMUSCULAR | Status: DC | PRN
  Administered 2020-04-10: 1.2 g via TOPICAL

## 2020-04-10 MED ORDER — MIDAZOLAM HCL 5 MG/5ML IJ SOLN
INTRAMUSCULAR | Status: DC | PRN
  Administered 2020-04-10: 2 mg via INTRAVENOUS

## 2020-04-10 MED ORDER — PHENYLEPHRINE HCL-NACL 10-0.9 MG/250ML-% IV SOLN
INTRAVENOUS | Status: DC | PRN
  Administered 2020-04-10: 60 ug/min via INTRAVENOUS

## 2020-04-10 MED ORDER — HYDROMORPHONE HCL 1 MG/ML IJ SOLN
0.2500 mg | INTRAMUSCULAR | Status: DC | PRN
  Administered 2020-04-10 (×4): 0.5 mg via INTRAVENOUS

## 2020-04-10 MED ORDER — LIDOCAINE 2% (20 MG/ML) 5 ML SYRINGE
INTRAMUSCULAR | Status: DC | PRN
  Administered 2020-04-10: 100 mg via INTRAVENOUS

## 2020-04-10 MED ORDER — VANCOMYCIN HCL 1000 MG IV SOLR
INTRAVENOUS | Status: AC
  Filled 2020-04-10: qty 1000

## 2020-04-10 MED ORDER — EPHEDRINE 5 MG/ML INJ
INTRAVENOUS | Status: AC
  Filled 2020-04-10: qty 10

## 2020-04-10 MED ORDER — PROPOFOL 10 MG/ML IV BOLUS
INTRAVENOUS | Status: AC
  Filled 2020-04-10: qty 20

## 2020-04-10 MED ORDER — OXYCODONE HCL 5 MG PO TABS
10.0000 mg | ORAL_TABLET | ORAL | Status: DC | PRN
  Administered 2020-04-10: 10 mg via ORAL
  Administered 2020-04-10 – 2020-04-11 (×3): 15 mg via ORAL
  Administered 2020-04-11: 10 mg via ORAL
  Administered 2020-04-11: 15 mg via ORAL
  Administered 2020-04-12: 10 mg via ORAL
  Administered 2020-04-12: 15 mg via ORAL
  Administered 2020-04-12: 10 mg via ORAL
  Administered 2020-04-12: 15 mg via ORAL
  Administered 2020-04-13: 10 mg via ORAL
  Administered 2020-04-13: 15 mg via ORAL
  Administered 2020-04-13 – 2020-04-14 (×2): 10 mg via ORAL
  Administered 2020-04-14 (×2): 15 mg via ORAL
  Administered 2020-04-15 (×3): 10 mg via ORAL
  Filled 2020-04-10: qty 3
  Filled 2020-04-10 (×3): qty 2
  Filled 2020-04-10 (×4): qty 3
  Filled 2020-04-10 (×2): qty 2
  Filled 2020-04-10: qty 3
  Filled 2020-04-10 (×2): qty 2
  Filled 2020-04-10 (×3): qty 3
  Filled 2020-04-10 (×4): qty 2

## 2020-04-10 MED ORDER — CHLORHEXIDINE GLUCONATE 0.12 % MT SOLN
OROMUCOSAL | Status: AC
  Administered 2020-04-10: 15 mL via OROMUCOSAL
  Filled 2020-04-10: qty 15

## 2020-04-10 MED ORDER — KETAMINE HCL 50 MG/5ML IJ SOSY
PREFILLED_SYRINGE | INTRAMUSCULAR | Status: AC
  Filled 2020-04-10: qty 5

## 2020-04-10 MED ORDER — FENTANYL CITRATE (PF) 100 MCG/2ML IJ SOLN
INTRAMUSCULAR | Status: AC
  Filled 2020-04-10: qty 2

## 2020-04-10 MED ORDER — ROCURONIUM BROMIDE 10 MG/ML (PF) SYRINGE
PREFILLED_SYRINGE | INTRAVENOUS | Status: AC
  Filled 2020-04-10: qty 10

## 2020-04-10 MED ORDER — LIDOCAINE 2% (20 MG/ML) 5 ML SYRINGE
INTRAMUSCULAR | Status: AC
  Filled 2020-04-10: qty 5

## 2020-04-10 SURGICAL SUPPLY — 69 items
ADAPTER GUIDE FAST 1.6 (MISCELLANEOUS) ×2 IMPLANT
BANDAGE ESMARK 6X9 LF (GAUZE/BANDAGES/DRESSINGS) ×1 IMPLANT
BIT DRILL 3.5X5.5 QC CALB (BIT) ×2 IMPLANT
BIT DRILL CALIBRATED 2.7 (BIT) ×2 IMPLANT
BNDG COHESIVE 4X5 TAN STRL (GAUZE/BANDAGES/DRESSINGS) ×2 IMPLANT
BNDG ELASTIC 4X5.8 VLCR STR LF (GAUZE/BANDAGES/DRESSINGS) IMPLANT
BNDG ELASTIC 6X10 VLCR STRL LF (GAUZE/BANDAGES/DRESSINGS) ×2 IMPLANT
BNDG ELASTIC 6X5.8 VLCR STR LF (GAUZE/BANDAGES/DRESSINGS) IMPLANT
BNDG ESMARK 6X9 LF (GAUZE/BANDAGES/DRESSINGS) ×2
BRUSH SCRUB EZ PLAIN DRY (MISCELLANEOUS) ×4 IMPLANT
CHLORAPREP W/TINT 26 (MISCELLANEOUS) ×2 IMPLANT
COVER SURGICAL LIGHT HANDLE (MISCELLANEOUS) ×2 IMPLANT
COVER WAND RF STERILE (DRAPES) ×2 IMPLANT
DRAPE C-ARM 42X72 X-RAY (DRAPES) ×2 IMPLANT
DRAPE C-ARMOR (DRAPES) ×2 IMPLANT
DRAPE ORTHO SPLIT 77X108 STRL (DRAPES) ×2
DRAPE SURG ORHT 6 SPLT 77X108 (DRAPES) ×2 IMPLANT
DRAPE U-SHAPE 47X51 STRL (DRAPES) ×2 IMPLANT
DRSG ADAPTIC 3X8 NADH LF (GAUZE/BANDAGES/DRESSINGS) IMPLANT
DRSG MEPITEL 4X7.2 (GAUZE/BANDAGES/DRESSINGS) ×2 IMPLANT
ELECT REM PT RETURN 9FT ADLT (ELECTROSURGICAL) ×2
ELECTRODE REM PT RTRN 9FT ADLT (ELECTROSURGICAL) ×1 IMPLANT
GAUZE SPONGE 4X4 12PLY STRL (GAUZE/BANDAGES/DRESSINGS) IMPLANT
GLOVE BIO SURGEON STRL SZ 6.5 (GLOVE) ×6 IMPLANT
GLOVE BIO SURGEON STRL SZ7.5 (GLOVE) ×6 IMPLANT
GLOVE BIOGEL PI IND STRL 6.5 (GLOVE) ×1 IMPLANT
GLOVE BIOGEL PI IND STRL 7.5 (GLOVE) ×1 IMPLANT
GLOVE BIOGEL PI INDICATOR 6.5 (GLOVE) ×1
GLOVE BIOGEL PI INDICATOR 7.5 (GLOVE) ×1
GOWN STRL REUS W/ TWL LRG LVL3 (GOWN DISPOSABLE) ×2 IMPLANT
GOWN STRL REUS W/TWL LRG LVL3 (GOWN DISPOSABLE) ×2
K-WIRE ACE 1.6X6 (WIRE) ×4
KIT PREVENA INCISION MGT20CM45 (CANNISTER) ×2 IMPLANT
KIT TURNOVER KIT B (KITS) ×2 IMPLANT
KWIRE ACE 1.6X6 (WIRE) ×2 IMPLANT
MANIFOLD NEPTUNE II (INSTRUMENTS) ×2 IMPLANT
NEEDLE HYPO 21X1.5 SAFETY (NEEDLE) IMPLANT
NEEDLE HYPO 25GX1X1/2 BEV (NEEDLE) ×2 IMPLANT
NS IRRIG 1000ML POUR BTL (IV SOLUTION) ×2 IMPLANT
PACK TOTAL JOINT (CUSTOM PROCEDURE TRAY) ×2 IMPLANT
PAD ARMBOARD 7.5X6 YLW CONV (MISCELLANEOUS) ×4 IMPLANT
PAD CAST 4YDX4 CTTN HI CHSV (CAST SUPPLIES) IMPLANT
PADDING CAST COTTON 4X4 STRL (CAST SUPPLIES)
PADDING CAST COTTON 6X4 STRL (CAST SUPPLIES) ×2 IMPLANT
PLATE 12H 226 RT MED DIST TIB (Plate) ×2 IMPLANT
SCREW CORTICAL LOW PROF 3.5X32 (Screw) ×2 IMPLANT
SCREW LOCK CORT STAR 3.5X36 (Screw) ×2 IMPLANT
SCREW LOCK CORT STAR 3.5X44 (Screw) ×2 IMPLANT
SCREW LOCK CORT STAR 3.5X46 (Screw) ×4 IMPLANT
SCREW LOW PROFILE 3.5X30MM TIS (Screw) ×4 IMPLANT
SCREW T15 LP CORT 3.5X38MM NS (Screw) ×2 IMPLANT
SCREW T15 LP CORT 3.5X46MM NS (Screw) ×2 IMPLANT
SCREW T15 MD 3.5X44MM NS (Screw) ×2 IMPLANT
SCREW T15 MD 3.5X52MM NS (Screw) ×2 IMPLANT
SPONGE LAP 18X18 RF (DISPOSABLE) IMPLANT
STAPLER VISISTAT 35W (STAPLE) ×2 IMPLANT
SUCTION FRAZIER HANDLE 10FR (MISCELLANEOUS) ×1
SUCTION TUBE FRAZIER 10FR DISP (MISCELLANEOUS) ×1 IMPLANT
SUT ETHILON 3 0 PS 1 (SUTURE) ×4 IMPLANT
SUT PROLENE 0 CT (SUTURE) IMPLANT
SUT VIC AB 0 CT1 27 (SUTURE) ×1
SUT VIC AB 0 CT1 27XBRD ANBCTR (SUTURE) ×1 IMPLANT
SUT VIC AB 2-0 CT1 27 (SUTURE) ×2
SUT VIC AB 2-0 CT1 TAPERPNT 27 (SUTURE) ×2 IMPLANT
SYR CONTROL 10ML LL (SYRINGE) ×2 IMPLANT
TOWEL GREEN STERILE (TOWEL DISPOSABLE) ×4 IMPLANT
TOWEL GREEN STERILE FF (TOWEL DISPOSABLE) ×2 IMPLANT
UNDERPAD 30X36 HEAVY ABSORB (UNDERPADS AND DIAPERS) ×2 IMPLANT
WATER STERILE IRR 1000ML POUR (IV SOLUTION) ×2 IMPLANT

## 2020-04-10 NOTE — Progress Notes (Signed)
Orthopedic Tech Progress Note Patient Details:  Jordan [name] Mar 29, 1966 [id number] OR RN called requesting a CAM WALKER. Dropped off at desk Ortho Devices Type of Ortho Device: CAM walker Ortho Device/Splint Interventions: Other (comment)   Post Interventions Patient Tolerated: Other (comment) Instructions Provided: Other (comment)   Janit Pagan 04/10/2020, 12:00 PM

## 2020-04-10 NOTE — Transfer of Care (Signed)
Immediate Anesthesia Transfer of Care Note  Patient: Jordan [name]  Procedure(s) Performed: OPEN REDUCTION INTERNAL FIXATION (ORIF) TIBIA/FIBULA FRACTURE (Right Leg Lower)  Patient Location: PACU  Anesthesia Type:General  Level of Consciousness: awake and patient cooperative  Airway & Oxygen Therapy: Patient Spontanous Breathing and Patient connected to face mask oxygen  Post-op Assessment: Report given to RN and Post -op Vital signs reviewed and stable  Post vital signs: Reviewed and stable  Last Vitals:  Vitals Value Taken Time  BP 115/72 04/10/20 1209  Temp    Pulse 72 04/10/20 1210  Resp 19 04/10/20 1210  SpO2 100 % 04/10/20 1210  Vitals shown include unvalidated device data.  Last Pain:  Vitals:   04/10/20 0800  TempSrc: Oral  PainSc:       Patients Stated Pain Goal: 0 (17/35/67 0141)  Complications: No complications documented.

## 2020-04-10 NOTE — Progress Notes (Signed)
   ORTHOPAEDIC PROGRESS NOTE  s/p Procedure(s): EXTERNAL FIXATION LEG on 04/10/2020  SUBJECTIVE: Reports some pain about operative site. No chest pain. No SOB. No nausea/vomiting. No other complaints.  OBJECTIVE: PE: Right lower extremity: ex fix in place. Pin sites clean and dry. Wound vac in place with good seal. No drainage noted in canister. Wiggles toes. Endorses distal sensation. Warm well perfused foot   Vitals:   04/10/20 0011 04/10/20 0325  BP: 138/62 114/86  Pulse: 69 81  Resp: 16 16  Temp: 98.4 F (36.9 C) 98.5 F (36.9 C)  SpO2: 100% 100%     ASSESSMENT: Jordan [name] is a 53 y.o. female doing well postoperatively.  PLAN: Weightbearing:  NWB RLE Insicional and dressing care: PRN Orthopedic device(s): Ex-fix VTE prophylaxis: SCDs. Lovenox Pain control: PRN pain medications Plan: Dr. Doreatha Martin was consulted for definitive fixation. Planning for ORIF of right ankle today.    Noemi Chapel, PA-C 04/10/2020

## 2020-04-10 NOTE — Progress Notes (Signed)
Pt seen in room  From PACU alert/oriented in no acute distress. Surgical site CDI. No complaints.

## 2020-04-10 NOTE — Anesthesia Preprocedure Evaluation (Addendum)
Anesthesia Evaluation  Patient identified by MRN, date of birth, ID band Patient awake    Reviewed: Allergy & Precautions, H&P , NPO status , Patient's Chart, lab work & pertinent test results  Airway Mallampati: II   Neck ROM: full    Dental  (+) Poor Dentition, Edentulous Upper, Missing,    Pulmonary pneumonia,    breath sounds clear to auscultation       Cardiovascular negative cardio ROS   Rhythm:regular Rate:Normal     Neuro/Psych PSYCHIATRIC DISORDERS Anxiety Bipolar Disorder Bipolarnegative neurological ROS     GI/Hepatic negative GI ROS, (+)     substance abuse  alcohol use,   Endo/Other  Morbid obesity  Renal/GU negative Renal ROS  negative genitourinary   Musculoskeletal Open Fx right tibia/fibula   Abdominal (+) + obese,   Peds  Hematology  (+) anemia ,   Anesthesia Other Findings   Reproductive/Obstetrics                            Anesthesia Physical  Anesthesia Plan  ASA: III  Anesthesia Plan: General   Post-op Pain Management:    Induction: Intravenous  PONV Risk Score and Plan: 3 and Ondansetron, Dexamethasone, Midazolam and Treatment may vary due to age or medical condition  Airway Management Planned: Oral ETT  Additional Equipment:   Intra-op Plan:   Post-operative Plan: Extubation in OR  Informed Consent: I have reviewed the patients History and Physical, chart, labs and discussed the procedure including the risks, benefits and alternatives for the proposed anesthesia with the patient or authorized representative who has indicated his/her understanding and acceptance.     Dental advisory given  Plan Discussed with: CRNA, Anesthesiologist and Surgeon  Anesthesia Plan Comments:        Anesthesia Quick Evaluation

## 2020-04-10 NOTE — Consult Note (Signed)
Orthopaedic Trauma Service (OTS) Consult   Patient ID: Jordan [name] MRN: [id number] DOB/AGE: 52-03-1966 52 y.o.  Reason for Consult:Right open distal tibia Referring Physician: Dr. Ophelia Charter, MD Raliegh Ip  HPI: Jordan [name] is an 54 y.o. female who is being seen in consultation at the request of Dr. Griffin Basil for evaluation of right open distal tibia fracture.  Patient has a significant history of bipolar that presented yesterday after a fall.  She was visiting her boyfriend in a nursing home when she slipped on wet floor.  She had immediate pain and deformity and open wound.  She presented to [facility] where she was subsequently transferred to [facility] for I&D and external fixation.  Dr. Griffin Basil felt that this was outside the scope of practice and recommended treatment by an orthopedic traumatologist.  Patient was seen and evaluated on 5 N. this morning.  Currently having some pain.  She takes baseline pain medications with oxycodone 10 mg once or twice a day for back pain and history of previous cancer.  She lives alone.  She states that she has no one really to help her.  She notes some numbness and tingling to the plantar aspect of her foot but otherwise she has no significant complaints.  Denies any injuries to any of her other extremities.  She states that ibuprofen helped a lot with her previous injuries and pain.  She is asking for some anti-inflammatories for this injury.  Past Medical History:  Diagnosis Date  . Alcohol abuse   . Anemia    patient denies  . Bipolar 1 disorder (HCC)    No medications currently  . CAP (community acquired pneumonia) 03/17/2015  . 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 Mental disorder   . Obesity   . PICC line infection 05/17/2015  . Sepsis due to Gram negative bacteria (MDR E Coli) 02/18/2015  . 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 UTI (lower urinary tract infection)   . Vaginal Pap smear, abnormal     Past  Surgical History:  Procedure Laterality Date  . ABDOMINAL HYSTERECTOMY    . CERVICAL CONIZATION W/BX N/A 07/01/2016   Procedure: CONIZATION CERVIX WITH BIOPSY;  Surgeon: Chancy Milroy, MD;  Location: [place] [facility];  Service: Gynecology;  Laterality: N/A;  . CHOLECYSTECTOMY    . EXTERNAL FIXATION LEG Right 04/09/2020   Procedure: EXTERNAL FIXATION LEG;  Surgeon: Hiram Gash, MD;  Location: [place];  Service: Orthopedics;  Laterality: Right;  . HYSTEROSCOPY WITH D & C N/A 01/25/2016   Procedure: DILATATION AND CURETTAGE /HYSTEROSCOPY;  Surgeon: Mora Bellman, MD;  Location: [place] [facility];  Service: Gynecology;  Laterality: N/A;  . LYMPH NODE BIOPSY Bilateral 07/22/2019   Procedure: LYMPH NODE BIOPSY;  Surgeon: Everitt Amber, MD;  Location: [facility];  Service: Gynecology;  Laterality: Bilateral;  . ROBOT ASSISTED MYOMECTOMY N/A 07/22/2019   Procedure: XI ROBOTIC ASSISTED LAPAROSCOPIC RADICAL UPPER VAGINECTOMY, LEFT SALPINECTOMY, RIGHT SALPINGOOOPHERECTOMY;  Surgeon: Everitt Amber, MD;  Location: [facility];  Service: Gynecology;  Laterality: N/A;  . VAGINAL HYSTERECTOMY N/A 03/11/2017   Procedure: HYSTERECTOMY VAGINAL WITH MORCELLATION;  Surgeon: Chancy Milroy, MD;  Location: [place] [facility];  Service: Gynecology;  Laterality: N/A;    Family History  Problem Relation Age of Onset  . Alcohol abuse Father   . Cancer Father   . Breast cancer Neg Hx     Social History:  reports that she has never smoked. She has never used smokeless  tobacco. She reports previous alcohol use. She reports that she does not use drugs.  Allergies:  Allergies  Allergen Reactions  . Ciprofloxacin Swelling    Lips swell, tongue swells, face swells  . Citalopram Other (See Comments)    Possible cause of pancytopenia. Swelling of tongue, face and throat  . Lamotrigine Other (See Comments)    Possible cause of pancytopenia. Swelling of face, throat and tongue  . Sulfa Antibiotics Anaphylaxis  . Tramadol Anaphylaxis, Shortness Of Breath and  Swelling    Medications:  No current facility-administered medications on file prior to encounter.   Current Outpatient Medications on File Prior to Encounter  Medication Sig Dispense Refill  . acetaminophen (TYLENOL) 500 MG tablet Take 1-2 tablets (500-1,000 mg total) by mouth every 6 (six) hours as needed for moderate pain. 30 tablet 1  . Ferrous Sulfate (IRON) 325 (65 Fe) MG TABS Take 1 tablet by mouth every other day.    . ibuprofen (ADVIL) 600 MG tablet Take 1 tablet (600 mg total) by mouth every 6 (six) hours as needed for moderate pain. 15 tablet 0  . lurasidone (LATUDA) 20 MG TABS tablet Take 20 mg by mouth daily.    . OXcarbazepine (TRILEPTAL) 150 MG tablet Take 75 mg by mouth in the morning and at bedtime.    Marland Kitchen oxybutynin (DITROPAN-XL) 10 MG 24 hr tablet Take 10 mg by mouth daily.    . Oxycodone HCl 10 MG TABS Take 10 mg by mouth 2 (two) times daily as needed (pain).     . Vitamin D, Ergocalciferol, (DRISDOL) 1.25 MG (50000 UT) CAPS capsule Take 50,000 Units by mouth once a week.    . estradiol (CLIMARA - DOSED IN MG/24 HR) 0.05 mg/24hr patch Place 1 patch (0.05 mg total) onto the skin once a week. 4 patch 12  . oxyCODONE (OXY IR/ROXICODONE) 5 MG immediate release tablet Take 1 tablet (5 mg total) by mouth every 4 (four) hours as needed for severe pain or breakthrough pain. (Patient not taking: Reported on 04/09/2020) 10 tablet 0    ROS: Constitutional: No fever or chills Vision: No changes in vision ENT: No difficulty swallowing CV: No chest pain Pulm: No SOB or wheezing GI: No nausea or vomiting GU: No urgency or inability to hold urine Skin: No poor wound healing Neurologic: No numbness or tingling Psychiatric: No depression or anxiety Heme: No bruising Allergic: No reaction to medications or food   Exam: Blood pressure 114/86, pulse 81, temperature 98.5 F (36.9 C), temperature source Oral, resp. rate 16, height 5\' 8"  (1.727 m), weight (!) 143.9 kg, SpO2 100  %. General: No acute distress Orientation: Awake alert and oriented x3 Mood and Affect: Cooperative and pleasant Gait: Within normal limits Coordination and balance: Within normal limits   Right lower extremity: Exfix is in place along with incisional wound VAC.  Swelling along distal medial tibia is benign with notable skin wrinkling.  She has intact sensation to the dorsum and plantar aspect of her foot.  She has a warm well-perfused foot with brisk cap refill.  She is able to actively dorsiflex and plantarflex her toes.  No deformity about the knee.  She is unable to tolerate any range of motion of her knee or hip secondary to pain in her ankle.  Diffuse tenderness about the right lower extremity.  Left lower extremity: Skin without lesions. No tenderness to palpation. Full painless ROM, full strength in each muscle groups without evidence of instability.   Medical  Decision Making: Data: Imaging: X-rays and CT scan of the right distal tibia are reviewed which shows a comminuted distal tibia and fibula fracture.  There is no intra-articular extension.  However there is some bone missing consistent with operative report dictating that there was some stripped cortical bone that was removed.  Labs:  Results for orders placed or performed during the hospital encounter of 04/09/20 (from the past 24 hour(s))  CBC with Differential     Status: None   Collection Time: 04/09/20 10:36 AM  Result Value Ref Range   WBC 7.8 4.0 - 10.5 K/uL   RBC 4.24 3.87 - 5.11 MIL/uL   Hemoglobin 13.7 12.0 - 15.0 g/dL   HCT 40.8 36 - 46 %   MCV 96.2 80.0 - 100.0 fL   MCH 32.3 26.0 - 34.0 pg   MCHC 33.6 30.0 - 36.0 g/dL   RDW 13.5 11.5 - 15.5 %   Platelets 159 150 - 400 K/uL   nRBC 0.0 0.0 - 0.2 %   Neutrophils Relative % 83 %   Neutro Abs 6.4 1.7 - 7.7 K/uL   Lymphocytes Relative 11 %   Lymphs Abs 0.9 0.7 - 4.0 K/uL   Monocytes Relative 6 %   Monocytes Absolute 0.5 0 - 1 K/uL   Eosinophils Relative 0 %    Eosinophils Absolute 0.0 0 - 0 K/uL   Basophils Relative 0 %   Basophils Absolute 0.0 0 - 0 K/uL   Immature Granulocytes 0 %   Abs Immature Granulocytes 0.03 0.00 - 0.07 K/uL  Comprehensive metabolic panel     Status: Abnormal   Collection Time: 04/09/20 10:36 AM  Result Value Ref Range   Sodium 139 135 - 145 mmol/L   Potassium 4.5 3.5 - 5.1 mmol/L   Chloride 106 98 - 111 mmol/L   CO2 25 22 - 32 mmol/L   Glucose, Bld 115 (H) 70 - 99 mg/dL   BUN 11 6 - 20 mg/dL   Creatinine, Ser 0.71 0.44 - 1.00 mg/dL   Calcium 8.9 8.9 - 10.3 mg/dL   Total Protein 6.8 6.5 - 8.1 g/dL   Albumin 4.0 3.5 - 5.0 g/dL   AST 16 15 - 41 U/L   ALT 12 0 - 44 U/L   Alkaline Phosphatase 118 38 - 126 U/L   Total Bilirubin 1.1 0.3 - 1.2 mg/dL   GFR calc non Af Amer >60 >60 mL/min   GFR calc Af Amer >60 >60 mL/min   Anion gap 8 5 - 15  SARS Coronavirus 2 by RT PCR (hospital order, performed in [place] hospital lab) Nasopharyngeal Nasopharyngeal Swab     Status: None   Collection Time: 04/09/20 10:37 AM   Specimen: Nasopharyngeal Swab  Result Value Ref Range   SARS Coronavirus 2 NEGATIVE NEGATIVE  I-Stat Beta hCG blood, ED ([facility], [facility], [facility] only)     Status: None   Collection Time: 04/09/20 12:55 PM  Result Value Ref Range   I-stat hCG, quantitative <5.0 <5 mIU/mL   Comment 3          Surgical pcr screen     Status: None   Collection Time: 04/09/20  1:22 PM   Specimen: Nasal Mucosa; Nasal Swab  Result Value Ref Range   MRSA, PCR NEGATIVE NEGATIVE   Staphylococcus aureus NEGATIVE NEGATIVE  CBC     Status: None   Collection Time: 04/09/20  6:22 PM  Result Value Ref Range   WBC 8.3 4.0 - 10.5  K/uL   RBC 4.01 3.87 - 5.11 MIL/uL   Hemoglobin 13.0 12.0 - 15.0 g/dL   HCT 38.5 36 - 46 %   MCV 96.0 80.0 - 100.0 fL   MCH 32.4 26.0 - 34.0 pg   MCHC 33.8 30.0 - 36.0 g/dL   RDW 13.3 11.5 - 15.5 %   Platelets 163 150 - 400 K/uL   nRBC 0.0 0.0 - 0.2 %  Creatinine, serum     Status: None   Collection Time:  04/09/20  6:22 PM  Result Value Ref Range   Creatinine, Ser 0.83 0.44 - 1.00 mg/dL   GFR calc non Af Amer >60 >60 mL/min   GFR calc Af Amer >60 >60 mL/min  Basic metabolic panel     Status: Abnormal   Collection Time: 04/10/20  3:52 AM  Result Value Ref Range   Sodium 138 135 - 145 mmol/L   Potassium 4.4 3.5 - 5.1 mmol/L   Chloride 105 98 - 111 mmol/L   CO2 24 22 - 32 mmol/L   Glucose, Bld 119 (H) 70 - 99 mg/dL   BUN 9 6 - 20 mg/dL   Creatinine, Ser 0.81 0.44 - 1.00 mg/dL   Calcium 8.8 (L) 8.9 - 10.3 mg/dL   GFR calc non Af Amer >60 >60 mL/min   GFR calc Af Amer >60 >60 mL/min   Anion gap 9 5 - 15  CBC     Status: Abnormal   Collection Time: 04/10/20  3:52 AM  Result Value Ref Range   WBC 8.8 4.0 - 10.5 K/uL   RBC 3.79 (L) 3.87 - 5.11 MIL/uL   Hemoglobin 12.8 12.0 - 15.0 g/dL   HCT 35.8 (L) 36 - 46 %   MCV 94.5 80.0 - 100.0 fL   MCH 33.8 26.0 - 34.0 pg   MCHC 35.8 30.0 - 36.0 g/dL   RDW 13.2 11.5 - 15.5 %   Platelets 179 150 - 400 K/uL   nRBC 0.0 0.0 - 0.2 %    Imaging or Labs ordered: None  Medical history and chart was reviewed and case discussed with medical provider.  Assessment/Plan: 54 year old female with a history of bipolar 1 status post I&D and external fixation for right open distal tibia and fibula fracture.  Patient requires formal open reduction internal fixation.  I feel that fixation using medial plate and screws is most appropriate.  I discussed risks and benefits with the patient.  Risks include but not limited to bleeding, infection, malunion, nonunion, hardware failure, hardware irritation, nerve or blood vessel injury, DVT, even the possibility anesthetic complications.  I feel that her swelling is appropriate to proceed with surgery today.  She is been placed on the surgery schedule.  Consent will be obtained.  She will need mobilization with physical therapy and Occupational Therapy postoperatively with possible skilled nursing facility placement versus  return home with home health.  Shona Needles, MD [facility] [phone] (office) [email or web address]

## 2020-04-10 NOTE — Anesthesia Procedure Notes (Signed)
Procedure Name: Intubation Date/Time: 04/10/2020 9:47 AM Performed by: Orlie Dakin, CRNA Pre-anesthesia Checklist: Patient identified, Emergency Drugs available, Suction available and Patient being monitored Patient Re-evaluated:Patient Re-evaluated prior to induction Oxygen Delivery Method: Circle system utilized Preoxygenation: Pre-oxygenation with 100% oxygen Induction Type: IV induction Ventilation: Mask ventilation without difficulty Laryngoscope Size: Miller and 3 Grade View: Grade I Tube type: Oral Tube size: 7.0 mm Number of attempts: 1 Airway Equipment and Method: Stylet Placement Confirmation: ETT inserted through vocal cords under direct vision,  positive ETCO2 and breath sounds checked- equal and bilateral Secured at: 21 cm Tube secured with: Tape Dental Injury: Teeth and Oropharynx as per pre-operative assessment  Comments: Oral airway placed gently after intubation.

## 2020-04-10 NOTE — Op Note (Signed)
Orthopaedic Surgery Operative Note (CSN: [id number] ) Date of Surgery: 04/10/2020  Admit Date: 04/09/2020   Diagnoses: Pre-Op Diagnoses: Right type II open tibial shaft and fibula fracture Right closed medial malleolus fracture   Post-Op Diagnosis: Same  Procedures: 1. CPT 27758-Open reduction internal fixation of right tibial shaft 2. CPT 11012-Irrigation and debridement of right open tibia fracture 3. CPT 27762-Closed treatment of right medial malleolus fracture 4. CPT 20694-Removal of external fixator right lower leg 5. CPT 97605-Incisional wound vac placement  Surgeons : Primary: Melvin Whiteford, Thomasene Lot, MD  Assistant: Izola Price, RNFA  Location: OR 3   Anesthesia:General  Antibiotics: Ancef 3 gm preop with 1 gm vancomycin powder and 1.2 gm tobramycin powder   Tourniquet time:None    Estimated Blood KHTX:774 mL  Complications:None   Specimens:None   Implants: Implant Name Type Inv. Item Serial No. Manufacturer Lot No. LRB No. Used Action  PLATE 12H 142 RT MED DIST TIB - [id number] Plate PLATE 43H 686 RT MED DIST TIB  [employer](ORTH,TRAU,BIO,SG)  Right 1 Implanted  SCREW T15 LP CORT 3.5X38MM NS - [id number] Screw SCREW T15 LP CORT 3.5X38MM NS  [employer](ORTH,TRAU,BIO,SG)  Right 1 Implanted  SCREW T15 LP CORT 3.5X46MM NS - [id number] Screw SCREW T15 LP CORT 3.5X46MM NS  [employer](ORTH,TRAU,BIO,SG)  Right 1 Implanted  SCREW T15 MD 3.5X44MM NS - [id number] Screw SCREW T15 MD 3.5X44MM NS  [employer](ORTH,TRAU,BIO,SG)  Right 1 Implanted  SCREW T15 MD 3.5X52MM NS - [id number] Screw SCREW T15 MD 3.5X52MM NS  [employer](ORTH,TRAU,BIO,SG)  Right 1 Implanted  SCREW LOCK CORT STAR 3.5X36 - [id number] Screw SCREW LOCK CORT STAR 3.5X36  [employer](ORTH,TRAU,BIO,SG)  Right 1 Implanted  SCREW LOCK CORT STAR 3.5X44 - [id number] Screw SCREW LOCK CORT STAR 3.5X44  [employer](ORTH,TRAU,BIO,SG)  Right 1 Implanted  SCREW LOCK CORT STAR 3.5X46 - [id number] Screw SCREW LOCK CORT STAR 3.5X46   [employer](ORTH,TRAU,BIO,SG)  Right 2 Implanted  SCREW LOW PROFILE 3.5X30MM TIS - [id number] Screw SCREW LOW PROFILE 3.5X30MM TIS  [employer](ORTH,TRAU,BIO,SG)  Right 2 Implanted  SCREW CORTICAL LOW PROF 3.5X32 - [id number] Screw SCREW CORTICAL LOW PROF 3.5X32  [employer](ORTH,TRAU,BIO,SG)  Right 1 Implanted     Indications for Surgery: 54 year old female who slipped and fell sustaining a right open tibial shaft and fibular shaft fracture.  She underwent irrigation and debridement with external fixation by Dr. Griffin Basil.  I was consulted for her complexity of injury and need for an orthopedic traumatologist.  I recommended proceeding with open reduction internal fixation of the tibia along with a removal of external fixation.  Risks and benefits were discussed with the patient.  Risks included but not limited to bleeding, infection, malunion, nonunion, hardware failure, hardware irritation, nerve and blood vessel injury, ankle stiffness ankle arthritis, DVT even the possibility anesthetic complications.  She agreed to proceed with surgery and consent was obtained.  Operative Findings: 1.  Open reduction internal fixation of right tibial shaft using [employer] ALPS distal medial tibial locking plate 2.  Repeat irrigation and debridement of right open tibial shaft fracture type II 3.  Closed treatment of right medial malleolus fracture with no instability noted about the ankle 4.  Removal of external fixation with debridement of external fixator pin sites with primary closure. 5.  Incisional wound VAC placement over the traumatic laceration  Procedure: The patient was identified in the preoperative holding area. Consent was confirmed with the patient and their family and all questions were answered. The operative extremity was marked  after confirmation with the patient. she was then brought back to the operating room by our anesthesia colleagues.  She was placed under general anesthetic and  carefully transferred over to a radiolucent flat top table.  A bump was placed under her operative hip.  The right lower extremity was prepped and draped in usual sterile fashion.  The external fixator was prepped within the field.  Timeout was performed to verify the patient, the procedure, and the extremities.  Preoperative antibiotics were dosed.  I for started out by removing the medial side of the external fixator.  I then reopened the traumatic laceration and I proceeded to perform excisional debridement with a knife and a 10 blade.  I was able to remove some fascia as well as use a curette to debride the intramedullary canal of the bone.  There was no contamination noted.  I then used a low pressure pulsatile lavage to thoroughly irrigate the bone ends in the wound with 3 L of normal saline.  Gloves and instruments were then changed.  I used fluoroscopic imaging to show the unstable nature of her injury.  I adjusted the external fixation to aligned both on the AP and lateral view.  I kept the lateral bar in place and remove the medial bar.  I then marked out an incision of a direct medial approach to the distal tibia.  I carried it down through skin and subcutaneous tissue.  I carefully protected the neurovascular bundle.  I then used a Cobb elevator to develop an interval between the skin and periosteum.  I then used a [employer] distal tibial medial plate and slid this subcutaneously along the medial border of the tibia.  I confirmed placement with fluoroscopy and held the distal portion of plate with a K wire.  I held the proximal portion of the plate with a percutaneously placed 1.6 mm K wire.  I confirmed adequate alignment and turned my attention to screw fixation.  A nonlocking screw was placed distally to bring the plate flush to bone.  Another nonlocking screw was placed distally as well.  I then returned to the tibial shaft and placed a nonlocking 3.5 millimeter screw into the tibial shaft  to bring the plate flush to the cortex.  I made sure that the alignment was still appropriate on AP and lateral fluoroscopic imaging.  Once I was pleased with the alignment and the fixation I then proceeded to place a locking screws in the distal segment.  A mixture of fixed angle locking and multidirectional locking were placed.  Excellent fixation was obtained.  I then placed nonlocking screws into the tibial shaft.  A total of 4 screws and all were placed into the tibial shaft to complete the construct.  The external fixator was then removed.  Final fluoroscopic imaging was obtained.  The incisions were copiously irrigated once more.  A gram of vancomycin powder 1.2 g of tobramycin powder were placed into the traumatic laceration in the open incision.  The skin was then closed with 2-0 Monocryl and 3-0 nylon.  The percutaneous sites were closed with 3-0 nylon.  The ex fix pin sites were closed last with 3-0 nylon.  A Prevena incisional VAC was placed over the traumatic laceration in the medial distal incision.  The remainder of the incisions were dressed with Mepitel, 4 x 4's and sterile cast padding.  An Ace wrap was placed and she was placed into a walking boot.  She was awoken from anesthesia  and taken to the PACU in stable condition.  Post Op Plan/Instructions: The patient will be nonweightbearing to the right lower extremity.  She will receive 24 hours of postoperative Ancef for open fracture prophylaxis.  She will continue receive Lovenox.  We will have her mobilize with physical and Occupational Therapy.  She will be placed in a boot and allow for gentle range of motion of the ankle and foot.  She should wear the boot for sleep as well as the majority of the day.  I was present and performed the entire surgery.  Katha Hamming, MD [facility]

## 2020-04-10 NOTE — Plan of Care (Signed)
  Problem: Education: Goal: Knowledge of General Education information will improve Description: Including pain rating scale, medication(s)/side effects and non-pharmacologic comfort measures Outcome: Progressing   Problem: Health Behavior/Discharge Planning: Goal: Ability to manage health-related needs will improve Outcome: Progressing   Problem: Clinical Measurements: Goal: Will remain free from infection Outcome: Progressing   Problem: Nutrition: Goal: Adequate nutrition will be maintained Outcome: Progressing   Problem: Coping: Goal: Level of anxiety will decrease Outcome: Progressing   Problem: Pain Managment: Goal: General experience of comfort will improve Outcome: Progressing   Problem: Safety: Goal: Ability to remain free from injury will improve Outcome: Progressing   

## 2020-04-10 NOTE — Anesthesia Postprocedure Evaluation (Signed)
Anesthesia Post Note  Patient: Jordan [name]  Procedure(s) Performed: OPEN REDUCTION INTERNAL FIXATION (ORIF) TIBIA/FIBULA FRACTURE (Right Leg Lower)     Patient location during evaluation: PACU Anesthesia Type: General Level of consciousness: awake and alert and oriented Pain management: pain level controlled Vital Signs Assessment: post-procedure vital signs reviewed and stable Respiratory status: spontaneous breathing, nonlabored ventilation, respiratory function stable and patient connected to nasal cannula oxygen Cardiovascular status: blood pressure returned to baseline and stable Postop Assessment: no apparent nausea or vomiting Anesthetic complications: no   No complications documented.  Last Vitals:  Vitals:   04/10/20 1209 04/10/20 1225  BP: 115/72 99/66  Pulse: 70 86  Resp: 17 18  Temp: (!) 36.3 C   SpO2: 92% 96%    Last Pain:  Vitals:   04/10/20 1209  TempSrc:   PainSc: 10-Worst pain ever                 Melaya Hoselton A.

## 2020-04-11 ENCOUNTER — Encounter (HOSPITAL_COMMUNITY): Payer: Self-pay | Admitting: Student

## 2020-04-11 ENCOUNTER — Telehealth: Payer: Self-pay | Admitting: Oncology

## 2020-04-11 LAB — CBC
HCT: 29.7 % — ABNORMAL LOW (ref 36.0–46.0)
Hemoglobin: 10.1 g/dL — ABNORMAL LOW (ref 12.0–15.0)
MCH: 32.3 pg (ref 26.0–34.0)
MCHC: 34 g/dL (ref 30.0–36.0)
MCV: 94.9 fL (ref 80.0–100.0)
Platelets: 168 10*3/uL (ref 150–400)
RBC: 3.13 MIL/uL — ABNORMAL LOW (ref 3.87–5.11)
RDW: 13.3 % (ref 11.5–15.5)
WBC: 7.4 10*3/uL (ref 4.0–10.5)
nRBC: 0 % (ref 0.0–0.2)

## 2020-04-11 MED ORDER — GABAPENTIN 100 MG PO CAPS
100.0000 mg | ORAL_CAPSULE | Freq: Three times a day (TID) | ORAL | Status: DC
  Administered 2020-04-11 – 2020-04-15 (×14): 100 mg via ORAL
  Filled 2020-04-11 (×14): qty 1

## 2020-04-11 NOTE — TOC Initial Note (Signed)
Transition of Care [facility]) - Initial/Assessment Note    Patient Details  Name: Jordan [name] MRN: [id number] Date of Birth: 04-02-1966  [facility]) CM/SW Contact:    Curlene Labrum, RN Phone Number: 04/11/2020, 11:50 AM  Clinical Narrative:                 Case management met with the patient - S/P Right tibia/fibula fracture - with CAM walker intact.  The patient is anxious and lives by herself with no family in the area other than a boyfriend who lives in the nursing home.  Waiting on PT/OT evaluation.  I plan to reach out to CIR about possible evaluation since the patient lives alone and has Medicaid.  Will continue to follow for discharge needs.  Expected Discharge Plan: [place] Barriers to Discharge: Continued Medical Work up, Unsafe home situation   Patient Goals and CMS Choice Patient states their goals for this hospitalization and ongoing recovery are:: Patient is anxious about her recovery - patient lives alone with no family in town other than a boyfriend who lives in the nursing home.      Expected Discharge Plan and Services Expected Discharge Plan: [place]   Discharge Planning Services: CM Consult Post Acute Care Choice:  (Waiting on PT/OT evaluation.) Living arrangements for the past 2 months: Apartment                                      Prior Living Arrangements/Services Living arrangements for the past 2 months: Apartment Lives with:: Self Patient language and need for interpreter reviewed:: Yes Do you feel safe going back to the place where you live?: No   Patient lives alone.  Need for Family Participation in Patient Care: Yes (Comment) Care giver support system in place?: No (comment)   Criminal Activity/Legal Involvement Pertinent to Current Situation/Hospitalization: No - Comment as needed  Activities of Daily Living Home Assistive Devices/Equipment: Eyeglasses ADL Screening  (condition at time of admission) Patient's cognitive ability adequate to safely complete daily activities?: Yes Is the patient deaf or have difficulty hearing?: No Does the patient have difficulty seeing, even when wearing glasses/contacts?: No Does the patient have difficulty concentrating, remembering, or making decisions?: No Patient able to express need for assistance with ADLs?: Yes Does the patient have difficulty dressing or bathing?: No Independently performs ADLs?: Yes (appropriate for developmental age) Does the patient have difficulty walking or climbing stairs?: Yes Weakness of Legs: Right Weakness of Arms/Hands: None  Permission Sought/Granted Permission sought to share information with : Case Manager Permission granted to share information with : Yes, Verbal Permission Granted              Emotional Assessment Appearance:: Appears stated age Attitude/Demeanor/Rapport: Apprehensive, Complaining Affect (typically observed): Accepting, Irritable, Frustrated Orientation: : Oriented to Self, Oriented to Place, Oriented to  Time, Oriented to Situation Alcohol / Substance Use: Not Applicable Psych Involvement: No (comment)  Admission diagnosis:  Fall [W19.XXXA] Type I or II open fracture of distal end of right tibia, unspecified fracture morphology, initial encounter [S82.301B] Open fracture of distal end of fibula and tibia, right, type I or II, initial encounter [S82.301B, S82.831B] Patient Active Problem List   Diagnosis Date Noted  . Closed fracture of medial malleolus of right ankle 04/10/2020  . Open fracture of distal end of fibula and tibia, right, type  I or II, initial encounter 04/09/2020  . Vaginal cancer ([place]) 07/22/2019  . Morbid obesity with BMI of 40.0-44.9, adult ([place]) 06/11/2019  . 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 Visit for routine gyn exam 04/21/2019  . Vaginal atrophy 04/21/2019  . Painful lumpy right breast 07/27/2016  . Urinary  tract infection 07/01/2015  . Diastolic dysfunction 35/70/1779  . Constipation 05/12/2015  . ESBL (extended spectrum beta-lactamase) producing bacteria infection 03/17/2015  . Diarrhea 03/03/2015  . Weakness 03/02/2015  . 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 Generalized anxiety disorder 02/20/2015  . Claustrophobia 02/20/2015  . Transaminitis 02/18/2015  . Chest pain 02/18/2015  . Abdominal pain   . Dizziness   . Hypotension 01/04/2015  . Bipolar disorder ([place]) 06/29/2012   PCP:  [name], NP Pharmacy:   [place], [address] [place] [address] Phone: [phone] Fax: [phone]     Social Determinants of Health (SDOH) Interventions    Readmission Risk Interventions Readmission Risk Prevention Plan 04/11/2020  Post Dischage Appt Complete  Medication Screening Complete  Transportation Screening Complete  Some recent data might be hidden

## 2020-04-11 NOTE — Progress Notes (Signed)
Inpatient Rehab Admissions Coordinator Note:   Per OT recommendations, pt was screened for CIR candidacy by Shann Medal, PT, DPT.  At this time we are recommending an inpatient rehab consult.  If pt would like to be considered, please place an IP Rehab MD consult order.    Shann Medal, PT, DPT [phone] 04/11/20 1:34 PM

## 2020-04-11 NOTE — Evaluation (Signed)
Physical Therapy Evaluation Patient Details Name: Jordan [name] MRN: [id number] DOB: 1966-04-07 Today's Date: 04/11/2020   History of Present Illness  Pt 54 y.o. female admitted on 04/09/20 post fall resulting in R tibia fibula fracture.  External fixation of R tibia and fibula fx on 04/09/20. ORIF R tibia, removal of external fixation and wound vac placement 04/10/20. PMH bipolar 1, cancer, anemia, ETOH use.  Clinical Impression  Pt presents with an overall decrease in functional mobility, increased fear, decreased balance, increased pain and generalized weakness secondary to above. PTA, pt lives in studio apartment alone, independent. Educ on on NWB precautions. Pt required consistent verbal and visual education on precautions during session and was unable to understand how to apply these precautions when walking. Pt reported she need to take "baby steps" and has a large fear of falling.Today, pt able to complete bed mobility and sit EOB, pt unable to transfer to chair with RW due to fear and disconnect with understanding NWB precautions. Educated pt on LE therex to complete in the bed to assist with LE strength and take baby steps before transferring. Pt agreeable to recommendations for rehab upon d/c. Pt would benefit from continued acute PT services to maximize functional mobility and independence prior to d/c to next venue of care.     Follow Up Recommendations CIR    Equipment Recommendations  Other (comment) (tbd)    Recommendations for Other Services Rehab consult     Precautions / Restrictions Precautions Precautions: Fall Restrictions Weight Bearing Restrictions: Yes RLE Weight Bearing: Non weight bearing      Mobility  Bed Mobility Overal bed mobility: Needs Assistance Bed Mobility: Supine to Sit;Sit to Supine     Supine to sit: Min assist;HOB elevated Sit to supine: Min assist;HOB elevated   General bed mobility comments: Pt required min(A) for LE assistance with bed  mobility and sitting EOB. Pt able to maintain balance sitting EOB without assistance.  Transfers Overall transfer level: Needs assistance Equipment used: Rolling walker (2 wheeled) Transfers: Sit to/from Stand Sit to Stand: Total assist;+2 physical assistance         General transfer comment: Attempted to stand x 5 pt unable to stand, pt able to rock forward and attempt to stand but reported increased fear of falling and feeling unsure about how to sit to stand without putting weight on LE.  Ambulation/Gait             General Gait Details: unable  Stairs            Wheelchair Mobility    Modified Rankin (Stroke Patients Only)       Balance Overall balance assessment: Needs assistance Sitting-balance support: Bilateral upper extremity supported;Feet supported Sitting balance-Leahy Scale: Good       Standing balance-Leahy Scale: Zero Standing balance comment: Pt currently unable to stand due to fear and difficulty with NWB precautions                             Pertinent Vitals/Pain Pain Assessment: Faces Faces Pain Scale: Hurts even more Pain Location: Pt reports increased pain with mobility in RLE Pain Descriptors / Indicators: Discomfort;Dull;Grimacing Pain Intervention(s): Limited activity within patient's tolerance;Monitored during session    Home Living Family/patient expects to be discharged to:: Inpatient rehab Living Arrangements: Alone Available Help at Discharge: Other (Comment) (none)                  Prior Function  Level of Independence: Independent               Hand Dominance        Extremity/Trunk Assessment   Upper Extremity Assessment Upper Extremity Assessment: Overall WFL for tasks assessed    Lower Extremity Assessment Lower Extremity Assessment: Generalized weakness;RLE deficits/detail;LLE deficits/detail RLE Deficits / Details: Pt able to activate hip and knee musculature without discomfort in  bed, taught pt bed mobility exercises. RLE: Unable to fully assess due to pain LLE Deficits / Details: Pt able to complete active ROM of ankle, knee and hip through functional activity of sitting EOB, rolling. Pt at least 3/5 mmt grossly    Cervical / Trunk Assessment Cervical / Trunk Assessment: Kyphotic  Communication   Communication: No difficulties  Cognition Arousal/Alertness: Awake/alert Behavior During Therapy: WFL for tasks assessed/performed Overall Cognitive Status: No family/caregiver present to determine baseline cognitive functioning Area of Impairment: Attention;Following commands;Safety/judgement;Awareness;Problem solving                   Current Attention Level: Focused   Following Commands: Follows multi-step commands inconsistently;Follows one step commands inconsistently Safety/Judgement: Decreased awareness of safety;Decreased awareness of deficits Awareness: Intellectual Problem Solving: Slow processing;Decreased initiation;Difficulty sequencing;Requires verbal cues;Requires tactile cues General Comments: Perseverating on fear of falling throughout session, throughout the session pt reported she could not understand how to stand without putting weight on LE despite verbal and visual demonstration, unsure of cog deficits at baseline (?), at end of session pt reported she does better with "baby steps".      General Comments General comments (skin integrity, edema, etc.): Pt skin integrity appeared WNL, majority of session was spent educating pt on precautions, reassurance with fear of falling and LE therex to complete in bed.    Exercises Total Joint Exercises Ankle Circles/Pumps: Left;5 reps;AROM;Supine Quad Sets: AROM;Both;5 reps;Supine Hip ABduction/ADduction: AROM;5 reps;Both;Supine   Assessment/Plan    PT Assessment Patient needs continued PT services  PT Problem List Decreased strength;Decreased mobility;Decreased safety awareness;Decreased range  of motion;Decreased coordination;Decreased knowledge of precautions;Decreased cognition;Decreased balance;Decreased knowledge of use of DME;Pain;Obesity       PT Treatment Interventions DME instruction;Therapeutic activities;Cognitive remediation;Gait training;Therapeutic exercise;Patient/family education;Stair training;Balance training;Functional mobility training;Neuromuscular re-education;Wheelchair mobility training    PT Goals (Current goals can be found in the Care Plan section)  Acute Rehab PT Goals Patient Stated Goal: go to rehab PT Goal Formulation: With patient Time For Goal Achievement: 04/25/20 Potential to Achieve Goals: Good    Frequency Min 5X/week   Barriers to discharge Inaccessible home environment;Decreased caregiver support      Co-evaluation               AM-PAC PT "6 Clicks" Mobility  Outcome Measure Help needed turning from your back to your side while in a flat bed without using bedrails?: None Help needed moving from lying on your back to sitting on the side of a flat bed without using bedrails?: A Little Help needed moving to and from a bed to a chair (including a wheelchair)?: Total Help needed standing up from a chair using your arms (e.g., wheelchair or bedside chair)?: Total Help needed to walk in hospital room?: Total Help needed climbing 3-5 steps with a railing? : Total 6 Click Score: 11    End of Session Equipment Utilized During Treatment: Gait belt Activity Tolerance: Patient limited by pain;Other (comment) (limited by fear) Patient left: in bed;with call bell/phone within reach;with bed alarm set Nurse Communication: Mobility status;Other (comment) (need for  new purwick) PT Visit Diagnosis: Unsteadiness on feet (R26.81);Other abnormalities of gait and mobility (R26.89);Difficulty in walking, not elsewhere classified (R26.2);Pain Pain - Right/Left: Right Pain - part of body: Leg    Time: 6484-7207 PT Time Calculation (min) (ACUTE  ONLY): 34 min   Charges:   PT Evaluation $PT Eval Moderate Complexity: 1 Mod PT Treatments $Therapeutic Activity: 8-22 mins        Rolland Porter SPT 04/11/2020   Rolland Porter 04/11/2020, 12:37 PM

## 2020-04-11 NOTE — Plan of Care (Signed)
  Problem: Education: Goal: Knowledge of General Education information will improve Description: Including pain rating scale, medication(s)/side effects and non-pharmacologic comfort measures Outcome: Progressing   Problem: Health Behavior/Discharge Planning: Goal: Ability to manage health-related needs will improve Outcome: Progressing   Problem: Clinical Measurements: Goal: Ability to maintain clinical measurements within normal limits will improve Outcome: Progressing Goal: Will remain free from infection Outcome: Progressing Goal: Diagnostic test results will improve Outcome: Progressing Goal: Respiratory complications will improve Outcome: Progressing Goal: Cardiovascular complication will be avoided Outcome: Progressing   Problem: Activity: Goal: Risk for activity intolerance will decrease Outcome: Progressing   Problem: Nutrition: Goal: Adequate nutrition will be maintained Outcome: Progressing   Problem: Coping: Goal: Level of anxiety will decrease Outcome: Progressing   Problem: Elimination: Goal: Will not experience complications related to bowel motility Outcome: Progressing Goal: Will not experience complications related to urinary retention Outcome: Progressing   Problem: Pain Managment: Goal: General experience of comfort will improve Outcome: Progressing   Problem: Safety: Goal: Ability to remain free from injury will improve Outcome: Progressing   Problem: Skin Integrity: Goal: Risk for impaired skin integrity will decrease Outcome: Progressing   Problem: Education: Goal: Knowledge of the prescribed therapeutic regimen will improve Outcome: Progressing   Problem: Activity: Goal: Ability to increase mobility will improve Outcome: Progressing   Problem: Physical Regulation: Goal: Postoperative complications will be avoided or minimized Outcome: Progressing   Problem: Pain Management: Goal: Pain level will decrease with appropriate  interventions Outcome: Progressing   Problem: Skin Integrity: Goal: Will show signs of wound healing Outcome: Progressing   

## 2020-04-11 NOTE — Progress Notes (Signed)
Orthopaedic Trauma Progress Note  S: Doing okay. Having some pain but meds and heating pads help. Having some pins and needles  O:  Vitals:   04/10/20 2359 04/11/20 0354  BP: 137/81 (!) 102/50  Pulse: 68 68  Resp: 20 18  Temp: 97.6 F (36.4 C) 98 F (36.7 C)  SpO2: 97% 93%    NAD RLE: Incisional wound vac with minimal output. ACE wrap in place. Wiggles toes, sensation intact  Imaging: Stable post op imaging  Labs:  Results for orders placed or performed during the hospital encounter of 04/09/20 (from the past 24 hour(s))  CBC     Status: Abnormal   Collection Time: 04/11/20  3:17 AM  Result Value Ref Range   WBC 7.4 4.0 - 10.5 K/uL   RBC 3.13 (L) 3.87 - 5.11 MIL/uL   Hemoglobin 10.1 (L) 12.0 - 15.0 g/dL   HCT 29.7 (L) 36 - 46 %   MCV 94.9 80.0 - 100.0 fL   MCH 32.3 26.0 - 34.0 pg   MCHC 34.0 30.0 - 36.0 g/dL   RDW 13.3 11.5 - 15.5 %   Platelets 168 150 - 400 K/uL   nRBC 0.0 0.0 - 0.2 %    Assessment: 54 year old female s/p fall  Injuries: Right type 2 open tibia fracture s/p I&D and ORIF  Weightbearing: NWB RLE  Insicional and dressing care: Incisional wound vac until Wed vs Thurs  Orthopedic device(s):Cam walking boot  CV/Blood loss:Acute blood loss anemia, Hgb 10.1, hemodynamically stable. No further CBC checks  Pain management: On chronic pain meds at home current regimen  1. Dilaudid 1 mg q 2hrs PRN 2. Robaxin 500 mg q 6 hrs PRN 3. Naprxen 250 mg BID 4. Oyxcodone 10-15 mg q 4 hrs PRN 5. Will add gabapentin  VTE prophylaxis: Lovenox 40 mg daily for 1 month  ID: Ancef for 24 hrs postop for open fracture prophylaxis  Foley/Lines: tolerating PO, KVO IV fluids, no foley  Medical co-morbidities: Bipolar-home med including lurasidone and oxcarbazepine  Impediments to Fracture Healing: Open fracture  Dispo: PT/OT eval, possible home with HH vs SNF  Follow - up plan: TBD   Shona Needles, MD [facility] [phone] (phone)

## 2020-04-11 NOTE — Telephone Encounter (Signed)
Jordan [name] called and said she fell on Sunday and broke her leg.  She is currently at [facility].  Advised her that I will call her back latter in the week to see how she is doing.

## 2020-04-11 NOTE — Evaluation (Signed)
Occupational Therapy Evaluation Patient Details Name: Jordan Jordan [name] MRN: [id number] DOB: 03-Mar-1966 Today's Date: 04/11/2020    History of Present Illness 54 y.o. female presenting after ground level fall resulting in a Jordan [name] open tib/fib fx. and medial malleolus fx. s/p ORIF with wound vac placement by Dr. Doreatha Martin on 6/21. PMHx significant for ETOH abuse and Bipolar disorder.   Clinical Impression   PTA, patient was living alone in a private residence and was independent with all BADLS/IADLs without use of device. Patient presenting below baseline level of function secondary to the above and decreased static/dynamic standing balance, difficulty maintaining RLE NWB precautions, perseveration on fear of falling, and difficulty ambulating. Patient currently requiring set-up assist grossly for UB BADLs and Max A grossly for LB BADLs and functional transfers. Patient did not progress past standing at EOB this date secondary to increased anxiety about falling despite OT education on safety precautions taken. Patient would benefit from continued skilled OT services to improve safety and independence with BADLs, functional mobility, and transfers. Patient would also benefit from post-acute CIR in prep for safe d/c to prior level of living.     Follow Up Recommendations  CIR    Equipment Recommendations  Other (comment) (Please defer to next level of care)    Recommendations for Other Services       Precautions / Restrictions Precautions Precautions: Fall Required Braces or Orthoses: Splint/Cast (Cam boot) Restrictions Weight Bearing Restrictions: Yes RLE Weight Bearing: Non weight bearing      Mobility Bed Mobility Overal bed mobility: Needs Assistance Bed Mobility: Supine to Sit;Sit to Supine     Supine to sit: Min assist;HOB elevated Sit to supine: Min assist;HOB elevated   General bed mobility comments: To advance RLE toward EOB  Transfers Overall transfer level: Needs  assistance Equipment used: Rolling walker (2 wheeled) Transfers: Sit to/from Stand Sit to Stand: Mod assist         General transfer comment: After demonstration and extensive verbal instruction, patient completed sit to stand from elevated EOB to RW with Mod A and cueing for hand placement.     Balance Overall balance assessment: Needs assistance Sitting-balance support: Feet supported Sitting balance-Leahy Scale: Good     Standing balance support: Bilateral upper extremity supported Standing balance-Leahy Scale: Poor Standing balance comment: Patient stood with cueing for adherence to RLE NWB. Heavy reliance on BUE.                            ADL either performed or assessed with clinical judgement   ADL Overall ADL's : Needs assistance/impaired                                             Vision Baseline Vision/History: No visual deficits;Wears glasses Wears Glasses: At all times Patient Visual Report: No change from baseline Vision Assessment?: No apparent visual deficits     Perception     Praxis      Pertinent Vitals/Pain Pain Assessment: 0-10 Pain Score: 4  Faces Pain Scale: Hurts even more Pain Location: RLE with movement Pain Descriptors / Indicators: Discomfort;Dull;Grimacing Pain Intervention(s): Limited activity within patient's tolerance     Hand Dominance Left   Extremity/Trunk Assessment Upper Extremity Assessment Upper Extremity Assessment: Overall WFL for tasks assessed   Lower Extremity Assessment Lower Extremity Assessment: Defer to PT evaluation  RLE Deficits / Details: Pt able to activate hip and knee musculature without discomfort in bed, taught pt bed mobility exercises. RLE: Unable to fully assess due to pain LLE Deficits / Details: Pt able to complete active ROM of ankle, knee and hip through functional activity of sitting EOB, rolling. Pt at least 3/5 mmt grossly   Cervical / Trunk Assessment Cervical /  Trunk Assessment: Kyphotic   Communication Communication Communication: No difficulties   Cognition Arousal/Alertness: Awake/alert Behavior During Therapy: WFL for tasks assessed/performed Overall Cognitive Status: No family/caregiver present to determine baseline cognitive functioning Area of Impairment: Attention;Following commands;Safety/judgement;Awareness;Problem solving                   Current Attention Level: Focused   Following Commands: Follows multi-step commands inconsistently;Follows one step commands inconsistently Safety/Judgement: Decreased awareness of safety;Decreased awareness of deficits Awareness: Intellectual Problem Solving: Slow processing;Decreased initiation;Difficulty sequencing;Requires verbal cues;Requires tactile cues General Comments: Perseverating on surgery and breaking her leg.    General Comments  Difficulty maintaining RLE NWB precautions    Exercises Total Joint Exercises Ankle Circles/Pumps: Left;5 reps;AROM;Supine Quad Sets: AROM;Both;5 reps;Supine Hip ABduction/ADduction: AROM;5 reps;Both;Supine   Shoulder Instructions      Home Living Family/patient expects to be discharged to:: Inpatient rehab Living Arrangements: Alone Available Help at Discharge: Other (Comment) (none)                                    Prior Functioning/Environment Level of Independence: Independent                 OT Problem List: Decreased activity tolerance;Impaired balance (sitting and/or standing);Decreased safety awareness;Decreased knowledge of use of DME or AE;Decreased knowledge of precautions;Obesity;Pain      OT Treatment/Interventions: Self-care/ADL training;Therapeutic exercise;Energy conservation;DME and/or AE instruction;Therapeutic activities;Patient/family education;Balance training    OT Goals(Current goals can be found in the care plan section) Acute Rehab OT Goals Patient Stated Goal: To get better. OT Goal  Formulation: With patient Time For Goal Achievement: 04/25/20 Potential to Achieve Goals: Good ADL Goals Pt Will Perform Grooming: with modified independence;standing Pt Will Perform Lower Body Dressing: with modified independence;sit to/from stand;with adaptive equipment Pt Will Transfer to Toilet: with modified independence;ambulating;bedside commode Pt Will Perform Toileting - Clothing Manipulation and hygiene: with modified independence;sit to/from stand Pt Will Perform Tub/Shower Transfer: Shower transfer;shower seat;rolling walker Additional ADL Goal #1: Patient will adhere to RLE NWB precautions during BADLs and functional transfers without cueing.  OT Frequency: Min 2X/week   Barriers to D/C: Decreased caregiver support          Co-evaluation              AM-PAC OT "6 Clicks" Daily Activity     Outcome Measure Help from another person eating meals?: None Help from another person taking care of personal grooming?: A Little Help from another person toileting, which includes using toliet, bedpan, or urinal?: A Lot Help from another person bathing (including washing, rinsing, drying)?: A Lot Help from another person to put on and taking off regular upper body clothing?: A Little Help from another person to put on and taking off regular lower body clothing?: A Lot 6 Click Score: 16   End of Session Equipment Utilized During Treatment: Gait belt;Rolling walker  Activity Tolerance: Other (comment) (Patient limited by anxiety ) Patient left: in bed;with call bell/phone within reach;with bed alarm set  OT Visit Diagnosis: Unsteadiness  on feet (R26.81);Muscle weakness (generalized) (M62.81);History of falling (Z91.81);Pain Pain - Right/Left: Left Pain - part of body: Leg                Time: 1243-1320 OT Time Calculation (min): 37 min Charges:  OT General Charges $OT Visit: 1 Visit OT Evaluation $OT Eval Moderate Complexity: 1 Mod OT Treatments $Self Care/Home  Management : 8-22 mins  Jordan Burr H. OTR/L Supplemental OT, Department of rehab services [phone]  Jordan Jordan [name] H. 04/11/2020, 1:43 PM

## 2020-04-12 NOTE — Progress Notes (Signed)
Physical Therapy Treatment Patient Details Name: Jordan [name] MRN: [id number] DOB: 12/29/1965 Today's Date: 04/12/2020    History of Present Illness 54 y.o. female presenting after ground level fall resulting in a R open tib/fib fx. and medial malleolus fx. s/p ORIF with wound vac placement by Dr. Doreatha Martin on 6/21. PMHx significant for ETOH abuse and Bipolar disorder.    PT Comments    Pt presented in bed at start of session. Pt required min(A) to scoot EOB with assistance for LE. Pt required increased verbal and tactile cues for education on NWB precautions, pt verbalized she understood precautions, pt unable to maintain during session despite PT efforts. Pt able to transfer from sit to stand and bed to chair with max(A) 1-2. Pt requested to "sit down" midway through transfer, this was not safe at the time and communicated to the patient. The patient stated "get your fucking hands off me", "do not push me" as transfer to chair was completed to maintain patient safety. Pt provided pain medication after session upon request. Will continue to follow acutely.     Follow Up Recommendations  CIR     Equipment Recommendations  Other (comment) (tbd)    Recommendations for Other Services       Precautions / Restrictions Precautions Precautions: Fall Precaution Comments: Pt struggles to maintain precautions despite extensive teaching and variety of modes of education Required Braces or Orthoses: Splint/Cast Restrictions Weight Bearing Restrictions: Yes RLE Weight Bearing: Non weight bearing    Mobility  Bed Mobility Overal bed mobility: Needs Assistance Bed Mobility: Supine to Sit;Sit to Supine     Supine to sit: Min assist;HOB elevated     General bed mobility comments: To advance RLE toward EOB  Transfers Overall transfer level: Needs assistance Equipment used: Rolling walker (2 wheeled) Transfers: Sit to/from [facility] Sit to Stand: Max assist;+2  safety/equipment;+2 physical assistance Stand pivot transfers: Max assist;+2 physical assistance;+2 safety/equipment       General transfer comment: Pt was educated on precautions throughout session, pt reported she understood precautions but could not demonstrate them with transfers despite PT educational attempts. Pt required max(A) for transfers due to fear and resistance to adherance NWB precautions.  Ambulation/Gait             General Gait Details: unable   Stairs             Wheelchair Mobility    Modified Rankin (Stroke Patients Only)       Balance Overall balance assessment: Needs assistance Sitting-balance support: Feet supported Sitting balance-Leahy Scale: Good     Standing balance support: Bilateral upper extremity supported Standing balance-Leahy Scale: Poor Standing balance comment: Patient stood with cueing for adherence to RLE NWB. Heavy reliance on BUE.                             Cognition Arousal/Alertness: Awake/alert Behavior During Therapy: WFL for tasks assessed/performed Overall Cognitive Status: No family/caregiver present to determine baseline cognitive functioning Area of Impairment: Attention;Following commands;Safety/judgement;Awareness;Problem solving                   Current Attention Level: Focused   Following Commands: Follows multi-step commands inconsistently;Follows one step commands inconsistently Safety/Judgement: Decreased awareness of safety;Decreased awareness of deficits Awareness: Intellectual Problem Solving: Slow processing;Decreased initiation;Difficulty sequencing;Requires verbal cues;Requires tactile cues General Comments: Continued to educate pt on precautions pt reported " I do not understand how to do that" despite  contiued education. Pt declined use of drop arm recliner. Pt throughout the session appeared frusterated then continued to repeatly state "i am sorry". During transfer pt wanted  to stop midway and sit down stating "get your fucking hands off me", "do not push me",  pt was unsafe to sit down and this was reiterated to her, pt was pivoted and safely assisted  to the chair.      Exercises      General Comments General comments (skin integrity, edema, etc.): Difficulty maintaining RLE NWB precautions      Pertinent Vitals/Pain Pain Assessment: Faces Faces Pain Scale: Hurts whole lot Pain Location: RLE with movement Pain Descriptors / Indicators: Discomfort;Dull;Grimacing Pain Intervention(s): Limited activity within patient's tolerance;Monitored during session;RN gave pain meds during session;Repositioned;Heat applied    Home Living                      Prior Function            PT Goals (current goals can now be found in the care plan section) Acute Rehab PT Goals Patient Stated Goal: To get better. PT Goal Formulation: With patient Time For Goal Achievement: 04/25/20 Potential to Achieve Goals: Good Progress towards PT goals: Progressing toward goals    Frequency    Min 5X/week      PT Plan Current plan remains appropriate    Co-evaluation              AM-PAC PT "6 Clicks" Mobility   Outcome Measure  Help needed turning from your back to your side while in a flat bed without using bedrails?: None Help needed moving from lying on your back to sitting on the side of a flat bed without using bedrails?: A Little Help needed moving to and from a bed to a chair (including a wheelchair)?: Total Help needed standing up from a chair using your arms (e.g., wheelchair or bedside chair)?: Total Help needed to walk in hospital room?: Total Help needed climbing 3-5 steps with a railing? : Total 6 Click Score: 11    End of Session Equipment Utilized During Treatment: Gait belt Activity Tolerance: Patient limited by pain;Other (comment) (cognition, fear) Patient left: in chair;with call bell/phone within reach;with chair alarm set Nurse  Communication: Mobility status;Other (comment);Patient requests pain meds;Need for lift equipment (need for new purwick) PT Visit Diagnosis: Unsteadiness on feet (R26.81);Other abnormalities of gait and mobility (R26.89);Difficulty in walking, not elsewhere classified (R26.2);Pain Pain - Right/Left: Right Pain - part of body: Leg     Time: 4128-7867 PT Time Calculation (min) (ACUTE ONLY): 32 min  Charges:  $Therapeutic Activity: 23-37 mins                     [employer] SPT 04/12/2020    Rolland Porter 04/12/2020, 12:16 PM

## 2020-04-12 NOTE — Progress Notes (Signed)
Physical Therapy Treatment Patient Details Name: Jordan [name] MRN: [id number] DOB: 04/16/1966 Today's Date: 04/12/2020    History of Present Illness 54 y.o. female presenting after ground level fall resulting in a R open tib/fib fx. and medial malleolus fx. s/p ORIF with wound vac placement by Dr. Doreatha Martin on 6/21. PMHx significant for ETOH abuse and Bipolar disorder.    PT Comments    Pt presents in chair, ready to transfer back to bed. Pt attempted to sit to stand to transfer to bed max(A)+2 physical assistance, but was unable to complete safely and sat back down for lift transfer. Pt lifted to bed with maxi move +2 for safety equipment. Pt would benefit from +3 assistance next session and co-treat to maximize mobility and safety. Will continue to follow acutely.   Follow Up Recommendations  CIR     Equipment Recommendations  Other (comment) (tbd)    Recommendations for Other Services       Precautions / Restrictions Precautions Precautions: Fall Precaution Comments: Pt struggles to maintain precautions despite extensive teaching and variety of modes of education Required Braces or Orthoses: Splint/Cast Restrictions Weight Bearing Restrictions: Yes RLE Weight Bearing: Non weight bearing    Mobility  Bed Mobility Overal bed mobility: Needs Assistance Bed Mobility: Rolling Rolling: Modified independent (Device/Increase time)   Supine to sit: Min assist;HOB elevated     General bed mobility comments: Pt rolled x2 to remove lift pad once in bed  Transfers Overall transfer level: Needs assistance Equipment used: Rolling walker (2 wheeled) Transfers: Sit to/from Stand Sit to Stand: Max assist;+2 physical assistance;+2 safety/equipment Stand pivot transfers: Max assist;+2 physical assistance;+2 safety/equipment       General transfer comment: Pt educated on precautions throughout session and prior to standing. Pt demonstrated increased awareness of precautions and  decreased awareness of safety with sit to stand. Pt more aware of precautions with sit to stand, pt unaware of safety due to wanting to transfer over to bed knowing that she was having trouble maintaining precautions. Used the maximove to transfer pt to bed to maintain safety and precautions.  Ambulation/Gait             General Gait Details: unable   Stairs             Wheelchair Mobility    Modified Rankin (Stroke Patients Only)       Balance Overall balance assessment: Needs assistance Sitting-balance support: Feet supported Sitting balance-Leahy Scale: Good     Standing balance support: Bilateral upper extremity supported Standing balance-Leahy Scale: Poor Standing balance comment: Patient stood with cueing for adherence to RLE NWB. Heavy reliance on BUE.                             Cognition Arousal/Alertness: Awake/alert Behavior During Therapy: WFL for tasks assessed/performed Overall Cognitive Status: No family/caregiver present to determine baseline cognitive functioning Area of Impairment: Attention;Following commands;Safety/judgement;Awareness;Problem solving                   Current Attention Level: Focused   Following Commands: Follows multi-step commands inconsistently;Follows one step commands inconsistently Safety/Judgement: Decreased awareness of safety;Decreased awareness of deficits Awareness: Intellectual Problem Solving: Slow processing;Decreased initiation;Difficulty sequencing;Requires verbal cues;Requires tactile cues General Comments: Attempted to lift pt to chair, pt reported she was "afraid of heights", "want to try standing to get to bed". Attempted to stand, in standing pt reported she was "putting weight through leg", "I still  want to try". Pt was educated that this was unsafe and asked to sit back down. Pt sat back down and was agreeable to lift to bed. Pt required verbal cues throughout session for precautions and  reassurance with transfers.      Exercises      General Comments General comments (skin integrity, edema, etc.): Pt continues to have difficulty maintaining RLE NWB precautions      Pertinent Vitals/Pain Pain Assessment: Faces Faces Pain Scale: Hurts whole lot Pain Location: RLE with movement Pain Descriptors / Indicators: Discomfort;Dull;Grimacing Pain Intervention(s): Limited activity within patient's tolerance;Monitored during session;Repositioned    Home Living                      Prior Function            PT Goals (current goals can now be found in the care plan section) Acute Rehab PT Goals Patient Stated Goal: To get better. PT Goal Formulation: With patient Time For Goal Achievement: 04/25/20 Potential to Achieve Goals: Good Progress towards PT goals: Progressing toward goals    Frequency    Min 5X/week      PT Plan Current plan remains appropriate    Co-evaluation              AM-PAC PT "6 Clicks" Mobility   Outcome Measure  Help needed turning from your back to your side while in a flat bed without using bedrails?: None Help needed moving from lying on your back to sitting on the side of a flat bed without using bedrails?: A Little Help needed moving to and from a bed to a chair (including a wheelchair)?: Total Help needed standing up from a chair using your arms (e.g., wheelchair or bedside chair)?: Total Help needed to walk in hospital room?: Total Help needed climbing 3-5 steps with a railing? : Total 6 Click Score: 11    End of Session Equipment Utilized During Treatment: Gait belt Activity Tolerance: Patient limited by pain;Other (comment) (fear) Patient left: in bed;with bed alarm set;with call bell/phone within reach Nurse Communication: Mobility status;Need for lift equipment;Patient requests pain meds PT Visit Diagnosis: Unsteadiness on feet (R26.81);Other abnormalities of gait and mobility (R26.89);Difficulty in walking,  not elsewhere classified (R26.2);Pain Pain - Right/Left: Right Pain - part of body: Leg     Time: 1310-1341 PT Time Calculation (min) (ACUTE ONLY): 31 min  Charges:  $Therapeutic Activity: 23-37 mins                     [employer] SPT 04/12/2020    Rolland Porter 04/12/2020, 3:51 PM

## 2020-04-12 NOTE — TOC CAGE-AID Note (Signed)
Transition of Care [facility]) - CAGE-AID Screening   Patient Details  Name: Jordan [name] MRN: [id number] Date of Birth: [birth year]  [facility] CM/SW Contact:    Emeterio Reeve, [place] Phone Number: 04/12/2020, 11:36 AM   Clinical Narrative:  Pt denied alcohol use and substance use.   CAGE-AID Screening:    Have You Ever Felt You Ought to Cut Down on Your Drinking or Drug Use?: No Have People Annoyed You By Critizing Your Drinking Or Drug Use?: No Have You Felt Bad Or Guilty About Your Drinking Or Drug Use?: No Have You Ever Had a Drink or Used Drugs First Thing In The Morning to STeady Your Nerves or to Get Rid of a Hangover?: No CAGE-AID Score: 0  Substance Abuse Education Offered: Yes  Substance abuse interventions: Patient Counseling  Emeterio Reeve, Latanya Presser, [place] Social Worker [phone]

## 2020-04-12 NOTE — Progress Notes (Signed)
Orthopaedic Trauma Progress Note  S: Working with therapy. PT recommending CIR.  O:  Vitals:   04/12/20 0529 04/12/20 1400  BP: (!) 118/58 125/60  Pulse: 67 69  Resp: 18 17  Temp: 98.1 F (36.7 C) 98.2 F (36.8 C)  SpO2: 100% 100%    NAD RLE: Incisional wound vac with minimal output. ACE wrap in place. Wiggles toes, sensation intact  Imaging: Stable post op imaging  Labs:  No results found for this or any previous visit (from the past 24 hour(s)).  Assessment: 54 year old female s/p fall  Injuries: Right type 2 open tibia fracture s/p I&D and ORIF  Weightbearing: NWB RLE  Insicional and dressing care: Incisional wound vac until Wed vs Thurs  Orthopedic device(s):Cam walking boot  CV/Blood loss:Acute blood loss anemia, hemodynamically stable. No further CBC checks  Pain management: On chronic pain meds at home current regimen  1. Dilaudid 1 mg q 2hrs PRN 2. Robaxin 500 mg q 6 hrs PRN 3. Naprxen 250 mg BID 4. Oyxcodone 10-15 mg q 4 hrs PRN 5. Gabapentin 100 mg TID  VTE prophylaxis: Lovenox 40 mg daily for 1 month  ID: Ancef for 24 hrs postop for open fracture prophylaxis  Foley/Lines: tolerating PO, KVO IV fluids, no foley  Medical co-morbidities: Bipolar-home med including lurasidone and oxcarbazepine  Impediments to Fracture Healing: Open fracture  Dispo: PT/OT eval, CIR consult  Follow - up plan: TBD   Shona Needles, MD [facility] [phone] (office) [email or web address]

## 2020-04-13 NOTE — Progress Notes (Signed)
Inpatient Rehab Admissions:  Inpatient Rehab Consult received.  I met with patient at the bedside for rehabilitation assessment and to discuss goals and expectations of an inpatient rehab admission.  She is on board for CIR, and showed excellent progress between therapy sessions yesterday and today.  Note that she would need to be modified independent, as she lives alone; I think that reasonable goals would be mod I at w/c level.  Her apartment is w/c accessible, since she has used a w/c in the past.  She is concerned about Medicaid coverage for CIR and I will look into this today.  Hopeful for admit in the next few days pending bed availability.   Signed: Shann Medal, PT, DPT Admissions Coordinator [phone] 04/13/20  3:22 PM

## 2020-04-13 NOTE — Progress Notes (Signed)
Orthopaedic Trauma Progress Note  S: Waiting on insurance for CIR. No issues  O:  Vitals:   04/13/20 0756 04/13/20 0757  BP: 102/68 102/68  Pulse: 100 100  Resp: 18 18  Temp: 98.3 F (36.8 C) 98.3 F (36.8 C)  SpO2: 97% 97%    NAD RLE: Dressing taken down. Incisions clean, dry and intact. Redressed. Neuro intact  Imaging: Stable post op imaging  Labs:  No results found for this or any previous visit (from the past 24 hour(s)).  Assessment: 54 year old female s/p fall  Injuries: Right type 2 open tibia fracture s/p I&D and ORIF  Weightbearing: NWB RLE  Insicional and dressing care: Dressing change PRN  Orthopedic device(s):Cam walking boot  CV/Blood loss:Acute blood loss anemia, hemodynamically stable. No further CBC checks  Pain management: On chronic pain meds at home current regimen  1. Dilaudid 1 mg q 2hrs PRN 2. Robaxin 500 mg q 6 hrs PRN 3. Naprxen 250 mg BID 4. Oyxcodone 10-15 mg q 4 hrs PRN 5. Gabapentin 100 mg TID  VTE prophylaxis: Lovenox 40 mg daily for 1 month  ID: Ancef for 24 hrs postop for open fracture prophylaxis  Foley/Lines: tolerating PO, KVO IV fluids, no foley  Medical co-morbidities: Bipolar-home med including lurasidone and oxcarbazepine  Impediments to Fracture Healing: Open fracture  Dispo: PT/OT eval, CIR consult  Follow - up plan: TBD   Shona Needles, MD [facility] [phone] (office) [email or web address]

## 2020-04-13 NOTE — Evaluation (Signed)
Occupational Therapy Evaluation Patient Details Name: Jordan [name] MRN: [id number] DOB: Mar 26, 1966 Today's Date: 04/13/2020    History of Present Illness 54 y.o. female presenting after ground level fall resulting in a R open tib/fib fx. and medial malleolus fx. s/p ORIF with wound vac placement by Dr. Doreatha Martin on 6/21. PMHx significant for ETOH abuse and Bipolar disorder.   Clinical Impression   Patient making steady progress toward established POC evident by decreased need for assistance with bed mobility, sit to stand transfers, and progression of mobility with patient completing stand-pivot transfer to recliner with Mod A +2 this date. Patient would continue to benefit from acute skilled OT services to maximize safety and independence with self-care tasks. Patient would also benefit from post-acute rehab in CIR.     Follow Up Recommendations  CIR    Equipment Recommendations  Other (comment) (Defer to next level of care)    Recommendations for Other Services       Precautions / Restrictions Precautions Precautions: Fall Precaution Comments: Pt struggles to maintain precautions despite extensive teaching and variety of modes of education Required Braces or Orthoses: Splint/Cast Splint/Cast: RLE Restrictions Weight Bearing Restrictions: Yes RLE Weight Bearing: Non weight bearing      Mobility Bed Mobility Overal bed mobility: Needs Assistance Bed Mobility: Supine to Sit     Supine to sit: HOB elevated;Min guard     General bed mobility comments: Patient very anxious about RLE requesting assistance although paitent is able to advanece limb to EOB with Min guard  Transfers Overall transfer level: Needs assistance Equipment used: Rolling walker (2 wheeled) Transfers: Sit to/from Stand Sit to Stand: Mod assist;+2 physical assistance;From elevated surface Stand pivot transfers: Mod assist;+2 physical assistance       General transfer comment: Sit to stand from elevated  EOB with Mod A +2 and cueing for hand placement. SPT to recliner with Mod A +2 and cues for proximity to chair.     Balance Overall balance assessment: Needs assistance Sitting-balance support: Feet supported Sitting balance-Leahy Scale: Good     Standing balance support: Bilateral upper extremity supported Standing balance-Leahy Scale: Poor Standing balance comment: Heavy reliance on BUE with RW                           ADL either performed or assessed with clinical judgement   ADL       Grooming: Set up;Wash/dry hands;Wash/dry face;Sitting   Upper Body Bathing: Set up;Sitting   Lower Body Bathing: Maximal assistance;+2 for physical assistance;Sit to/from stand   Upper Body Dressing : Set up;Sitting   Lower Body Dressing: Maximal assistance;+2 for physical assistance                 General ADL Comments: Patient declined washing front perineal area and buttocks in standing despite +2 assist.      Vision         Perception     Praxis      Pertinent Vitals/Pain Pain Assessment: 0-10 Pain Score: 10-Worst pain ever Pain Location: Back  Pain Descriptors / Indicators: Aching;Grimacing Pain Intervention(s): Repositioned;Limited activity within patient's tolerance     Hand Dominance     Extremity/Trunk Assessment             Communication     Cognition Arousal/Alertness: Awake/alert Behavior During Therapy: WFL for tasks assessed/performed Overall Cognitive Status: Within Functional Limits for tasks assessed Area of Impairment: Attention;Following commands;Safety/judgement;Awareness;Problem solving  Current Attention Level: Focused   Following Commands: Follows multi-step commands inconsistently;Follows one step commands inconsistently Safety/Judgement: Decreased awareness of safety;Decreased awareness of deficits Awareness: Intellectual Problem Solving: Slow processing;Decreased initiation;Difficulty  sequencing;Requires verbal cues;Requires tactile cues     General Comments  Difficulty maintaining RLE WB precautions. Patient with increased anxiety and perseveration about d/c plan.     Exercises     Shoulder Instructions      Home Living                                          Prior Functioning/Environment                   OT Problem List:        OT Treatment/Interventions:      OT Goals(Current goals can be found in the care plan section) Acute Rehab OT Goals Patient Stated Goal: To go to CIR. OT Goal Formulation: With patient Time For Goal Achievement: 04/25/20 Potential to Achieve Goals: Good ADL Goals Pt Will Perform Grooming: with modified independence;standing Pt Will Perform Lower Body Dressing: with modified independence;sit to/from stand;with adaptive equipment Pt Will Transfer to Toilet: with modified independence;ambulating;bedside commode Pt Will Perform Toileting - Clothing Manipulation and hygiene: with modified independence;sit to/from stand Pt Will Perform Tub/Shower Transfer: Shower transfer;shower seat;rolling walker Additional ADL Goal #1: Patient will adhere to RLE NWB precautions during BADLs and functional transfers without cueing.  OT Frequency: Min 2X/week   Barriers to D/C:            Co-evaluation PT/OT/SLP Co-Evaluation/Treatment: Yes Reason for Co-Treatment: To address functional/ADL transfers   OT goals addressed during session: ADL's and self-care      AM-PAC OT "6 Clicks" Daily Activity     Outcome Measure Help from another person eating meals?: None Help from another person taking care of personal grooming?: A Little Help from another person toileting, which includes using toliet, bedpan, or urinal?: A Lot Help from another person bathing (including washing, rinsing, drying)?: A Lot Help from another person to put on and taking off regular upper body clothing?: A Little Help from another person to put  on and taking off regular lower body clothing?: A Lot 6 Click Score: 16   End of Session Equipment Utilized During Treatment: Gait belt;Rolling walker Nurse Communication: Patient requests pain meds  Activity Tolerance: Patient limited by pain Patient left: in chair;with call bell/phone within reach;with chair alarm set  OT Visit Diagnosis: Unsteadiness on feet (R26.81);Muscle weakness (generalized) (M62.81);History of falling (Z91.81);Pain Pain - Right/Left: Right Pain - part of body: Leg (Low back at rest, RLE with movement)                Time: 4782-9562 OT Time Calculation (min): 22 min Charges:  OT General Charges $OT Visit: 1 Visit OT Treatments $Self Care/Home Management : 8-22 mins  Antonina Deziel H. OTR/L Supplemental OT, Department of rehab services [phone]  Sinead Hockman R H. 04/13/2020, 9:41 AM

## 2020-04-13 NOTE — Progress Notes (Signed)
Physical Therapy Treatment Patient Details Name: Jordan [name] MRN: [id number] DOB: 05-07-1966 Today's Date: 04/13/2020    History of Present Illness 54 y.o. female presenting after ground level fall resulting in a R open tib/fib fx. and medial malleolus fx. s/p ORIF with wound vac placement by Dr. Doreatha Martin on 6/21. PMHx significant for ETOH abuse and Bipolar disorder.    PT Comments    Pt presented in bed. Pt able to complete bed mobility min guard and sit EOB to wash her upper body with no assistance. Pt required mod(A)+2 for sit to stand and stand pivot transfer for safety and assist to maintain NWB precautions. Pt continued to have difficulty maintaining NWB precautions but was able to better maintain today than in past sessions. Pt was reeducated on precautions throughout the session using verbal, visual and tactile cues. Will continue to follow acutely.    Follow Up Recommendations  CIR     Equipment Recommendations  Other (comment)    Recommendations for Other Services       Precautions / Restrictions Precautions Precautions: Fall Precaution Comments: Pt struggles to maintain precautions despite extensive teaching and variety of modes of education Required Braces or Orthoses: Splint/Cast Splint/Cast: RLE Restrictions Weight Bearing Restrictions: Yes RLE Weight Bearing: Non weight bearing    Mobility  Bed Mobility Overal bed mobility: Needs Assistance Bed Mobility: Supine to Sit     Supine to sit: HOB elevated;Min guard     General bed mobility comments: Patient very anxious about RLE requesting assistance although paitent is able to advanece limb to EOB with Min guard  Transfers Overall transfer level: Needs assistance Equipment used: Rolling walker (2 wheeled) Transfers: Sit to/from Stand Sit to Stand: Mod assist;+2 physical assistance;From elevated surface Stand pivot transfers: Mod assist;+2 physical assistance       General transfer comment: Sit to stand  from elevated EOB with Mod A +2 and cueing for hand placement. SPT to recliner with Mod A +2 and cues for proximity to chair.   Ambulation/Gait                 Stairs             Wheelchair Mobility    Modified Rankin (Stroke Patients Only)       Balance Overall balance assessment: Needs assistance Sitting-balance support: Feet supported Sitting balance-Leahy Scale: Good Sitting balance - Comments: able clean upper body in sitting   Standing balance support: Bilateral upper extremity supported Standing balance-Leahy Scale: Poor Standing balance comment: Heavy reliance on BUE with RW                            Cognition Arousal/Alertness: Awake/alert Behavior During Therapy: WFL for tasks assessed/performed Overall Cognitive Status: Within Functional Limits for tasks assessed Area of Impairment: Attention;Following commands;Safety/judgement;Awareness;Problem solving                   Current Attention Level: Focused   Following Commands: Follows multi-step commands inconsistently;Follows one step commands inconsistently Safety/Judgement: Decreased awareness of safety;Decreased awareness of deficits Awareness: Intellectual Problem Solving: Slow processing;Decreased initiation;Difficulty sequencing;Requires verbal cues;Requires tactile cues General Comments: Required verbal and tactile cues to stay on task, redirect and maintain WB precautions. Pt reeducated on WB precautions and did a better job today of maintaining them.      Exercises Other Exercises Other Exercises: Incentive spirometery education and practice    General Comments General comments (skin integrity, edema, etc.): Pt  better able to maintain RLE WB precautions today with continued and extensive education. Pt still had difficulty maintaining RLE WB precautions. Pt with increased anxiety and perseveration about d/c plan.      Pertinent Vitals/Pain Pain Assessment:  Faces Faces Pain Scale: Hurts whole lot Pain Location: Back  Pain Descriptors / Indicators: Aching;Grimacing Pain Intervention(s): Limited activity within patient's tolerance;Repositioned    Home Living                      Prior Function            PT Goals (current goals can now be found in the care plan section) Acute Rehab PT Goals Patient Stated Goal: To go to CIR. PT Goal Formulation: With patient Time For Goal Achievement: 04/25/20 Potential to Achieve Goals: Good Progress towards PT goals: Progressing toward goals    Frequency    Min 5X/week      PT Plan Current plan remains appropriate    Co-evaluation PT/OT/SLP Co-Evaluation/Treatment: Yes Reason for Co-Treatment: Complexity of the patient's impairments (multi-system involvement);Necessary to address cognition/behavior during functional activity;To address functional/ADL transfers PT goals addressed during session: Mobility/safety with mobility        AM-PAC PT "6 Clicks" Mobility   Outcome Measure  Help needed turning from your back to your side while in a flat bed without using bedrails?: None Help needed moving from lying on your back to sitting on the side of a flat bed without using bedrails?: A Little Help needed moving to and from a bed to a chair (including a wheelchair)?: A Lot Help needed standing up from a chair using your arms (e.g., wheelchair or bedside chair)?: A Lot Help needed to walk in hospital room?: Total Help needed climbing 3-5 steps with a railing? : Total 6 Click Score: 13    End of Session Equipment Utilized During Treatment: Gait belt Activity Tolerance: Patient limited by pain;Other (comment) (anxiety with mobility) Patient left: with call bell/phone within reach;in chair;with chair alarm set Nurse Communication: Mobility status;Need for lift equipment;Patient requests pain meds PT Visit Diagnosis: Unsteadiness on feet (R26.81);Other abnormalities of gait and  mobility (R26.89);Difficulty in walking, not elsewhere classified (R26.2);Pain Pain - Right/Left: Right Pain - part of body: Leg     Time: 5638-9373 PT Time Calculation (min) (ACUTE ONLY): 27 min  Charges:  $Therapeutic Activity: 8-22 mins                     [employer] SPT 04/13/2020    Rolland Porter 04/13/2020, 1:30 PM

## 2020-04-13 NOTE — Plan of Care (Signed)
  Problem: Education: Goal: Knowledge of General Education information will improve Description: Including pain rating scale, medication(s)/side effects and non-pharmacologic comfort measures Outcome: Progressing   Problem: Health Behavior/Discharge Planning: Goal: Ability to manage health-related needs will improve Outcome: Progressing   Problem: Clinical Measurements: Goal: Ability to maintain clinical measurements within normal limits will improve Outcome: Progressing Goal: Will remain free from infection Outcome: Progressing Goal: Diagnostic test results will improve Outcome: Progressing Goal: Respiratory complications will improve Outcome: Progressing Goal: Cardiovascular complication will be avoided Outcome: Progressing   Problem: Activity: Goal: Risk for activity intolerance will decrease Outcome: Progressing   Problem: Nutrition: Goal: Adequate nutrition will be maintained Outcome: Progressing   Problem: Coping: Goal: Level of anxiety will decrease Outcome: Progressing   Problem: Elimination: Goal: Will not experience complications related to bowel motility Outcome: Progressing Goal: Will not experience complications related to urinary retention Outcome: Progressing   Problem: Pain Managment: Goal: General experience of comfort will improve Outcome: Progressing   Problem: Safety: Goal: Ability to remain free from injury will improve Outcome: Progressing   Problem: Skin Integrity: Goal: Risk for impaired skin integrity will decrease Outcome: Progressing   Problem: Education: Goal: Knowledge of the prescribed therapeutic regimen will improve Outcome: Progressing   Problem: Activity: Goal: Ability to increase mobility will improve Outcome: Progressing   Problem: Physical Regulation: Goal: Postoperative complications will be avoided or minimized Outcome: Progressing   Problem: Pain Management: Goal: Pain level will decrease with appropriate  interventions Outcome: Progressing   Problem: Skin Integrity: Goal: Will show signs of wound healing Outcome: Progressing   

## 2020-04-14 NOTE — Progress Notes (Signed)
Inpatient Rehab Admissions Coordinator:   I have a bed for this pt to admit to CIR on Saturday (6/26).  Dr. Doreatha Martin in agreement.  Rehab MD (Dr. Dagoberto Ligas) to assess pt and confirm admission on Saturday.  Floor RN can call CIR at [phone] for report after 12pm on Saturday.  I have let pt/family and case manager know.    Shann Medal, PT, DPT Admissions Coordinator [phone] 04/14/20  3:58 PM

## 2020-04-14 NOTE — Progress Notes (Signed)
Physical Therapy Treatment Patient Details Name: Jordan [name] MRN: [id number] DOB: 1966-06-24 Today's Date: 04/14/2020    History of Present Illness 54 y.o. female presenting after ground level fall resulting in a R open tib/fib fx. and medial malleolus fx. s/p ORIF with wound vac placement by Dr. Doreatha Martin on 6/21. PMHx significant for ETOH abuse and Bipolar disorder.    PT Comments    Pt supine in bed on arrival.  She is eager to mobilize but once she starts to mobilize she becomes increasingly anxious and is not receptive to corrections to maintain weight bearing and safety.  Continue to recommend aggressive CIR to maximize functional gains and improve strength so she can return home.     Follow Up Recommendations  CIR     Equipment Recommendations  Other (comment)    Recommendations for Other Services Rehab consult     Precautions / Restrictions Precautions Precautions: Fall Precaution Comments: Pt struggles to maintain precautions despite extensive teaching and variety of modes of education Required Braces or Orthoses: Splint/Cast Splint/Cast: RLE Restrictions Weight Bearing Restrictions: Yes RLE Weight Bearing: Non weight bearing (unable to maintain)    Mobility  Bed Mobility Overal bed mobility: Needs Assistance Bed Mobility: Supine to Sit     Supine to sit: Min assist;+2 for physical assistance     General bed mobility comments: Pt very anxious and required assistance to move RLE to edge of bed and to support back in seated position.  Pt get easily distracted and perserates on what she needs in the moment vs. focusing on functional task.  Transfers Overall transfer level: Needs assistance Equipment used: Rolling walker (2 wheeled) Transfers: Sit to/from Stand Sit to Stand: Mod assist;+2 physical assistance Stand pivot transfers: Mod assist;+2 physical assistance       General transfer comment: Increased time to build up courage to attempt transfer.  Pt  placing weight through R foot through out and refused assistance to maintain NWB.  Pt reports," Let me do it I know how." (Shuffling steps from bed to recliner.  Unable to progress to gt training due to innability to maintain weight bearing.)  Ambulation/Gait                 Stairs             Wheelchair Mobility    Modified Rankin (Stroke Patients Only)       Balance Overall balance assessment: Needs assistance Sitting-balance support: Feet supported Sitting balance-Leahy Scale: Good Sitting balance - Comments: able clean upper body in sitting   Standing balance support: Bilateral upper extremity supported Standing balance-Leahy Scale: Poor Standing balance comment: Heavy reliance on BUE with RW                            Cognition Arousal/Alertness: Awake/alert Behavior During Therapy: Anxious;Agitated Overall Cognitive Status: Impaired/Different from baseline Area of Impairment: Safety/judgement;Following commands;Problem solving                       Following Commands: Follows multi-step commands inconsistently;Follows one step commands inconsistently Safety/Judgement: Decreased awareness of safety;Decreased awareness of deficits   Problem Solving: Slow processing;Decreased initiation;Difficulty sequencing;Requires verbal cues;Requires tactile cues General Comments: Required verbal and tactile cues to stay on task, redirect and maintain WB precautions. Pt reeducated on WB precautions and became agitated reports," I know how to do this.  I have been doing this for three days!"  She continues to  place weight through R foot.      Exercises      General Comments        Pertinent Vitals/Pain Pain Assessment: 0-10 Pain Score: 10-Worst pain ever Pain Location: R LE Pain Descriptors / Indicators: Aching;Grimacing Pain Intervention(s): Repositioned    Home Living                      Prior Function            PT Goals  (current goals can now be found in the care plan section) Acute Rehab PT Goals Patient Stated Goal: To go to CIR. Potential to Achieve Goals: Good Progress towards PT goals: Progressing toward goals    Frequency    Min 5X/week      PT Plan Current plan remains appropriate    Co-evaluation              AM-PAC PT "6 Clicks" Mobility   Outcome Measure  Help needed turning from your back to your side while in a flat bed without using bedrails?: None Help needed moving from lying on your back to sitting on the side of a flat bed without using bedrails?: A Little Help needed moving to and from a bed to a chair (including a wheelchair)?: A Lot Help needed standing up from a chair using your arms (e.g., wheelchair or bedside chair)?: A Lot Help needed to walk in hospital room?: Total Help needed climbing 3-5 steps with a railing? : Total 6 Click Score: 13    End of Session Equipment Utilized During Treatment: Gait belt Activity Tolerance: Patient limited by pain;Other (comment) (anxiety and self limiting behavior continues to limit progress.) Patient left: in chair;with call bell/phone within reach;with chair alarm set Nurse Communication: Mobility status;Need for lift equipment;Patient requests pain meds (Informed nursing patient is agreeable to sit in recliner x 1 hour.) PT Visit Diagnosis: Unsteadiness on feet (R26.81);Other abnormalities of gait and mobility (R26.89);Difficulty in walking, not elsewhere classified (R26.2);Pain Pain - Right/Left: Right Pain - part of body: Leg     Time: 1643-5391 PT Time Calculation (min) (ACUTE ONLY): 18 min  Charges:  $Therapeutic Activity: 8-22 mins                     Erasmo Leventhal , PTA Acute Rehabilitation Services Pager [phone] Office [phone]     Nicoletta Hush Eli Hose 04/14/2020, 5:25 PM

## 2020-04-14 NOTE — PMR Pre-admission (Signed)
PMR Admission Coordinator Pre-Admission Assessment  Patient: Jordan [name] is an 54 y.o., female MRN: [id number] DOB: 02/03/1966 Height: 5' 8" (172.7 cm) Weight: (!) 143.9 kg  Insurance Information HMO:     PPO:      PCP:      IPA:      80/20:      OTHER:  PRIMARY: Medicaid      Policy#: [id number] p      Subscriber: pt CM Name:       Phone#:      Fax#:  Pre-Cert#: Coverage code [id number]      Employer: n/a Benefits:  Phone #:      Name:  Eff. Date: active as of 04/14/2020     Deduct:       Out of Pocket Max:       Life Max:  CIR:       SNF:  Outpatient:      Co-Pay:  Home Health:       Co-Pay:  DME:      Co-Pay:  Providers:  SECONDARY:       Policy#:      Phone#:   Development worker, community:       Phone#:   The Engineer, petroleum" for patients in Inpatient Rehabilitation Facilities with attached "Privacy Act [place] Records" was provided and verbally reviewed with: N/A  Emergency Contact Information Contact Information    Name Relation Home Work Mobile   [place] Significant other [phone]  [place], [name] Daughter [phone]  [phone]   Katha Hamming   [phone]      Current Medical History  Patient Admitting Diagnosis: R open ankle fx  History of Present Illness: Pt is a 54 y/o female with PMH of bipolar disorder, squamous cell carcinoma of vagina, and anemia admitted following a fall resulting in a right open tibia and fibula fracture.  She was initially seen at [facility] and transferred to [facility] for operative management on 04/09/20.  Prior to admission she ambulated without a device and lived independently.  Post op course complicated by ABLA.  Pt NWB on RLE.  Therapy evaluations were completed and pt was recommended for CIR.     Patient's medical record from [facility] has been reviewed by the rehabilitation admission coordinator and physician.  Past Medical History  Past Medical History:   Diagnosis Date  . Alcohol abuse   . Anemia    patient denies  . Bipolar 1 disorder (HCC)    No medications currently  . CAP (community acquired pneumonia) 03/17/2015  . 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 Mental disorder   . Obesity   . PICC line infection 05/17/2015  . Sepsis due to Gram negative bacteria (MDR E Coli) 02/18/2015  . 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[place])   . UTI (lower urinary tract infection)   . Vaginal Pap smear, abnormal     Family History   family history includes Alcohol abuse in her father; Cancer in her father.  Prior Rehab/Hospitalizations Has the patient had prior rehab or hospitalizations prior to admission? Yes  Has the patient had major surgery during 100 days prior to admission? Yes   Current Medications  Current Facility-Administered Medications:  .  bisacodyl (DULCOLAX) EC tablet 5 mg, 5 mg, Oral, Daily PRN, Haddix, Thomasene Lot, MD .  diphenhydrAMINE (BENADRYL) 12.5 MG/5ML elixir 12.5-25 mg, 12.5-25 mg, Oral, Q4H PRN, Haddix, Thomasene Lot, MD .  docusate sodium (COLACE)  capsule 100 mg, 100 mg, Oral, BID, Haddix, Thomasene Lot, MD, 100 mg at 04/14/20 0848 .  enoxaparin (LOVENOX) injection 40 mg, 40 mg, Subcutaneous, Q24H, Haddix, Thomasene Lot, MD, 40 mg at 04/14/20 0849 .  gabapentin (NEURONTIN) capsule 100 mg, 100 mg, Oral, TID, Haddix, Thomasene Lot, MD, 100 mg at 04/14/20 0848 .  HYDROmorphone (DILAUDID) injection 1 mg, 1 mg, Intravenous, Q2H PRN, Haddix, Thomasene Lot, MD, 1 mg at 04/14/20 1405 .  lactated ringers infusion, , Intravenous, Continuous, Haddix, Thomasene Lot, MD, Last Rate: 10 mL/hr at 04/10/20 0858, New Bag at 04/10/20 0858 .  lurasidone (LATUDA) tablet 20 mg, 20 mg, Oral, Daily, Haddix, Thomasene Lot, MD, 20 mg at 04/14/20 0848 .  magnesium citrate solution 1 Bottle, 1 Bottle, Oral, Once PRN, Haddix, Thomasene Lot, MD .  methocarbamol (ROBAXIN) tablet 500 mg, 500 mg, Oral, Q6H PRN, 500 mg at 04/13/20 0631 **OR** methocarbamol (ROBAXIN) 500 mg in  dextrose 5 % 50 mL IVPB, 500 mg, Intravenous, Q6H PRN, Haddix, Thomasene Lot, MD .  metoCLOPramide (REGLAN) tablet 5-10 mg, 5-10 mg, Oral, Q8H PRN **OR** metoCLOPramide (REGLAN) injection 5-10 mg, 5-10 mg, Intravenous, Q8H PRN, Haddix, Thomasene Lot, MD .  naproxen (NAPROSYN) tablet 250 mg, 250 mg, Oral, BID WC, Haddix, Thomasene Lot, MD, 250 mg at 04/14/20 0848 .  ondansetron (ZOFRAN) tablet 4 mg, 4 mg, Oral, Q6H PRN **OR** ondansetron (ZOFRAN) injection 4 mg, 4 mg, Intravenous, Q6H PRN, Haddix, Thomasene Lot, MD, 4 mg at 04/10/20 1127 .  OXcarbazepine (TRILEPTAL) tablet 75 mg, 75 mg, Oral, BID, Haddix, Thomasene Lot, MD, 75 mg at 04/14/20 0848 .  oxybutynin (DITROPAN-XL) 24 hr tablet 10 mg, 10 mg, Oral, Daily, Haddix, Thomasene Lot, MD, 10 mg at 04/14/20 0848 .  oxyCODONE (Oxy IR/ROXICODONE) immediate release tablet 10-15 mg, 10-15 mg, Oral, Q4H PRN, Haddix, Thomasene Lot, MD, 15 mg at 04/14/20 1040 .  polyethylene glycol (MIRALAX / GLYCOLAX) packet 17 g, 17 g, Oral, Daily PRN, Haddix, Thomasene Lot, MD  Patients Current Diet:  Diet Order            Diet regular Room service appropriate? Yes; Fluid consistency: Thin  Diet effective now                 Precautions / Restrictions Precautions Precautions: Fall Precaution Comments: Pt struggles to maintain precautions despite extensive teaching and variety of modes of education Restrictions Weight Bearing Restrictions: Yes RLE Weight Bearing: Non weight bearing   Has the patient had 2 or more falls or a fall with injury in the past year? Yes  Prior Activity Level Limited Community (1-2x/wk): used community transportation, did not go out much, used RW or w/c at baseline   Prior Functional Level Self Care: Did the patient need help bathing, dressing, using the toilet or eating? Independent  Indoor Mobility: Did the patient need assistance with walking from room to room (with or without device)? Independent  Stairs: Did the patient need assistance with internal or external stairs  (with or without device)? Independent  Functional Cognition: Did the patient need help planning regular tasks such as shopping or remembering to take medications? Independent  Home Assistive Devices / Equipment Home Assistive Devices/Equipment: Eyeglasses  Prior Device Use: Indicate devices/aids used by the patient prior to current illness, exacerbation or injury? None of the above  Current Functional Level Cognition  Overall Cognitive Status: Within Functional Limits for tasks assessed Current Attention Level: Focused Orientation Level: Oriented X4 Following Commands: Follows multi-step commands inconsistently, Follows  one step commands inconsistently Safety/Judgement: Decreased awareness of safety, Decreased awareness of deficits General Comments: Required verbal and tactile cues to stay on task, redirect and maintain WB precautions. Pt reeducated on WB precautions and did a better job today of maintaining them.    Extremity Assessment (includes Sensation/Coordination)  Upper Extremity Assessment: Overall WFL for tasks assessed  Lower Extremity Assessment: Defer to PT evaluation RLE Deficits / Details: Pt able to activate hip and knee musculature without discomfort in bed, taught pt bed mobility exercises. RLE: Unable to fully assess due to pain LLE Deficits / Details: Pt able to complete active ROM of ankle, knee and hip through functional activity of sitting EOB, rolling. Pt at least 3/5 mmt grossly    ADLs  Overall ADL's : Needs assistance/impaired Grooming: Set up, Wash/dry hands, Wash/dry face, Sitting Upper Body Bathing: Set up, Sitting Lower Body Bathing: Maximal assistance, +2 for physical assistance, Sit to/from stand Upper Body Dressing : Set up, Sitting Lower Body Dressing: Maximal assistance, +2 for physical assistance General ADL Comments: Patient declined washing front perineal area and buttocks in standing despite +2 assist.     Mobility  Overal bed mobility:  Needs Assistance Bed Mobility: Supine to Sit Rolling: Modified independent (Device/Increase time) Supine to sit: HOB elevated, Min guard Sit to supine: Min assist, HOB elevated General bed mobility comments: Patient very anxious about RLE requesting assistance although paitent is able to advanece limb to EOB with Min guard    Transfers  Overall transfer level: Needs assistance Equipment used: Rolling walker (2 wheeled) Transfer via Lift Equipment: Maximove Transfers: Sit to/from Stand Sit to Stand: Mod assist, +2 physical assistance, From elevated surface Stand pivot transfers: Mod assist, +2 physical assistance General transfer comment: Sit to stand from elevated EOB with Mod A +2 and cueing for hand placement. SPT to recliner with Mod A +2 and cues for proximity to chair.     Ambulation / Gait / Stairs / Wheelchair Mobility  Ambulation/Gait General Gait Details: unable    Posture / Balance Dynamic Sitting Balance Sitting balance - Comments: able clean upper body in sitting Balance Overall balance assessment: Needs assistance Sitting-balance support: Feet supported Sitting balance-Leahy Scale: Good Sitting balance - Comments: able clean upper body in sitting Standing balance support: Bilateral upper extremity supported Standing balance-Leahy Scale: Poor Standing balance comment: Heavy reliance on BUE with RW    Special needs/care consideration Behavioral consideration bipolar/anxiety and Designated visitor [place] (from acute therapy documentation) Living Arrangements: Alone Available Help at Discharge: Other (Comment) (none) Home Care Services: No  Discharge Living Setting Plans for Discharge Living Setting: Patient's home Type of Home at Discharge: Apartment Discharge Home Layout: One level Discharge Home Access: Level entry Discharge Bathroom Shower/Tub: Walk-in shower Discharge Bathroom Toilet: Standard Discharge Bathroom  Accessibility: Yes How Accessible: Accessible via wheelchair Does the patient have any problems obtaining your medications?: No  Social/Family/Support Systems Patient Roles: Partner Contact Information: partner, Merry Proud, is a permanent SNF resident Anticipated Caregiver: neighbor, Verdene Lennert, is applying for PCA status for patient Anticipated Caregiver's Contact Information: [phone] Ability/Limitations of Caregiver: min assist Caregiver Availability: 24/7 Discharge Plan Discussed with Primary Caregiver: Yes Is Caregiver In Agreement with Plan?: Yes Does Caregiver/Family have Issues with Lodging/Transportation while Pt is in Rehab?: No  Goals Patient/Family Goal for Rehab: PT/OT supervision to min assist, w/c level and limited ambulatory/transfer Expected length of stay: 14-18 days Pt/Family Agrees to Admission and willing to participate: Yes Program Orientation Provided &  Reviewed with Pt/Caregiver Including Roles  & Responsibilities: Yes  Barriers to Discharge: Insurance for SNF coverage, Lack of/limited family support  Decrease burden of Care through IP rehab admission: n/a  Possible need for SNF placement upon discharge: not anticipated  Patient Condition: I have reviewed medical records from [facility], spoken with CM, and patient. I met with patient at the bedside for inpatient rehabilitation assessment.  Patient will benefit from ongoing PT and OT, can actively participate in 3 hours of therapy a day 5 days of the week, and can make measurable gains during the admission.  Patient will also benefit from the coordinated team approach during an Inpatient Acute Rehabilitation admission.  The patient will receive intensive therapy as well as Rehabilitation physician, nursing, social worker, and care management interventions.  Due to bladder management, bowel management, safety, skin/wound care, disease management, medication administration, pain management and patient education  the patient requires 24 hour a day rehabilitation nursing.  The patient is currently mod assist +2 with mobility and basic ADLs.  Discharge setting and therapy post discharge at home with home health is anticipated.  Patient has agreed to participate in the Acute Inpatient Rehabilitation Program and will admit today.  Preadmission Screen Completed By:  Michel Santee, 04/14/2020 4:16 PM ______________________________________________________________________   Discussed status with Dr. Dagoberto Ligas on 04/14/20  at 4:27 PM  and received approval for admission today.  Admission Coordinator:  Michel Santee, PT, DPT time  12:00pm Date 04/14/2010 Assessment/Plan: Diagnosis: R ankle fracture 1. Does the need for close, 24 hr/day Medical supervision in concert with the patient's rehab needs make it unreasonable for this patient to be served in a less intensive setting? Yes 2. Co-Morbidities requiring supervision/potential complications: bipolar, anxiety- severe, R tib/fib fx s/p exfix and ORIF, NWB RLE- chronic back pain; constipation 3. Due to bladder management, bowel management, safety, skin/wound care, disease management, medication administration, pain management and patient education, does the patient require 24 hr/day rehab nursing? Yes 4. Does the patient require coordinated care of a physician, rehab nurse, PT, OT, and SLP to address physical and functional deficits in the context of the above medical diagnosis(es)? Yes Addressing deficits in the following areas: balance, endurance, locomotion, strength, transferring, bowel/bladder control, bathing, dressing, feeding, grooming and toileting 5. Can the patient actively participate in an intensive therapy program of at least 3 hrs of therapy 5 days a week? Yes 6. The potential for patient to make measurable gains while on inpatient rehab is fair 7. Anticipated functional outcomes upon discharge from inpatient rehab: modified independent and supervision  PT, modified independent and supervision OT, n/a SLP 8. Estimated rehab length of stay to reach the above functional goals is: 14-18 days 9. Anticipated discharge destination: Home 10. Overall Rehab/Functional Prognosis: fair   MD Signature:

## 2020-04-14 NOTE — Progress Notes (Signed)
Inpatient Rehab Admissions Coordinator:   Met with pt at bedside.  Confirmed her medicaid will cover CIR, and that she has adequate support at discharge (her neighbor, Verdene Lennert) if needed.  I do not have any beds for this patient through the weekend.  Will continue to follow for timing of rehab admit pending bed availability.   Shann Medal, PT, DPT Admissions Coordinator [phone] 04/14/20  12:47 PM

## 2020-04-14 NOTE — Plan of Care (Signed)

## 2020-04-14 NOTE — Progress Notes (Signed)
Orthopaedic Trauma Progress Note  S: No changes  O:  Vitals:   04/14/20 0449 04/14/20 0748  BP: 118/65 120/64  Pulse: 91 81  Resp: 16 17  Temp: 98.4 F (36.9 C) 97.6 F (36.4 C)  SpO2: 99% 97%    NAD RLE: No changes  Imaging: Stable post op imaging  Labs:  No results found for this or any previous visit (from the past 24 hour(s)).  Assessment: 54 year old female s/p fall  Injuries: Right type 2 open tibia fracture s/p I&D and ORIF  Weightbearing: NWB RLE  Insicional and dressing care: Dressing change PRN  Orthopedic device(s):Cam walking boot  CV/Blood loss:Acute blood loss anemia, hemodynamically stable. No further CBC checks  Pain management: On chronic pain meds at home current regimen  1. Dilaudid 1 mg q 2hrs PRN 2. Robaxin 500 mg q 6 hrs PRN 3. Naprxen 250 mg BID 4. Oyxcodone 10-15 mg q 4 hrs PRN 5. Gabapentin 100 mg TID  VTE prophylaxis: Lovenox 40 mg daily for 1 month  ID: Ancef for 24 hrs postop for open fracture prophylaxis  Foley/Lines: tolerating PO, KVO IV fluids, no foley  Medical co-morbidities: Bipolar-home med including lurasidone and oxcarbazepine  Impediments to Fracture Healing: Open fracture  Dispo: PT/OT eval, CIR consult-possible admission to CIR  Follow - up plan: TBD   Shona Needles, MD [facility] [phone] (office) [email or web address]

## 2020-04-14 NOTE — Progress Notes (Signed)
Patient  Mews score 2 at 2048. Pt is stable and RN recheck Vital signs and Mews score at 1.

## 2020-04-14 NOTE — Progress Notes (Signed)
   04/14/20 1505  Clinical Encounter Type  Visited With Patient  Visit Type Initial;Spiritual support  Referral From Chaplain  Consult/Referral To Chaplain  Spiritual Encounters  Spiritual Needs Emotional  Stress Factors  Patient Stress Factors Health changes;Loss of control  This chaplain responded to Chaplain-LL referral for Pt. spiritual care.  Upon arrival the Pt. described her pain as a 10. The chaplain observed the Pt. de-escalate to a place where she could participate in conversation with the chaplain. The chaplain learned the Pt. daughter, boyfriend, friends, and faith are very important to the Pt.  The chaplain heard the Pt.'s request to get rid of her anger from the accident in a positive way.  The Pt. agreed to try art.  The chaplain shared paper and water-color markers, with the Pt. RN-Randy's permission.  The chaplain will F/U with spiritual care on Monday.

## 2020-04-15 ENCOUNTER — Encounter (HOSPITAL_COMMUNITY): Payer: Self-pay | Admitting: Physical Medicine & Rehabilitation

## 2020-04-15 ENCOUNTER — Other Ambulatory Visit: Payer: Self-pay

## 2020-04-15 ENCOUNTER — Inpatient Hospital Stay (HOSPITAL_COMMUNITY)
Admission: RE | Admit: 2020-04-15 | Discharge: 2020-05-04 | DRG: 560 | Disposition: A | Discharge status: Skilled Nursing Facility | Payer: Medicaid Other | Source: Intra-hospital | Attending: Physical Medicine & Rehabilitation | Admitting: Physical Medicine & Rehabilitation

## 2020-04-15 DIAGNOSIS — Z90710 Acquired absence of both cervix and uterus: Secondary | ICD-10-CM

## 2020-04-15 DIAGNOSIS — Z6841 Body Mass Index (BMI) 40.0 and over, adult: Secondary | ICD-10-CM

## 2020-04-15 DIAGNOSIS — N390 Urinary tract infection, site not specified: Secondary | ICD-10-CM

## 2020-04-15 DIAGNOSIS — G8929 Other chronic pain: Secondary | ICD-10-CM | POA: Diagnosis present

## 2020-04-15 DIAGNOSIS — D72819 Decreased white blood cell count, unspecified: Secondary | ICD-10-CM | POA: Diagnosis not present

## 2020-04-15 DIAGNOSIS — D62 Acute posthemorrhagic anemia: Secondary | ICD-10-CM

## 2020-04-15 DIAGNOSIS — Z20822 Contact with and (suspected) exposure to covid-19: Secondary | ICD-10-CM | POA: Diagnosis present

## 2020-04-15 DIAGNOSIS — Z8544 Personal history of malignant neoplasm of other female genital organs: Secondary | ICD-10-CM

## 2020-04-15 DIAGNOSIS — G8918 Other acute postprocedural pain: Secondary | ICD-10-CM

## 2020-04-15 DIAGNOSIS — S8251XD Displaced fracture of medial malleolus of right tibia, subsequent encounter for closed fracture with routine healing: Secondary | ICD-10-CM

## 2020-04-15 DIAGNOSIS — D709 Neutropenia, unspecified: Secondary | ICD-10-CM | POA: Diagnosis present

## 2020-04-15 DIAGNOSIS — B9620 Unspecified Escherichia coli [E. coli] as the cause of diseases classified elsewhere: Secondary | ICD-10-CM | POA: Diagnosis present

## 2020-04-15 DIAGNOSIS — Z9049 Acquired absence of other specified parts of digestive tract: Secondary | ICD-10-CM | POA: Diagnosis not present

## 2020-04-15 DIAGNOSIS — F411 Generalized anxiety disorder: Secondary | ICD-10-CM

## 2020-04-15 DIAGNOSIS — S82201S Unspecified fracture of shaft of right tibia, sequela: Secondary | ICD-10-CM | POA: Diagnosis not present

## 2020-04-15 DIAGNOSIS — R197 Diarrhea, unspecified: Secondary | ICD-10-CM | POA: Diagnosis not present

## 2020-04-15 DIAGNOSIS — S82291E Other fracture of shaft of right tibia, subsequent encounter for open fracture type I or II with routine healing: Principal | ICD-10-CM

## 2020-04-15 DIAGNOSIS — F4310 Post-traumatic stress disorder, unspecified: Secondary | ICD-10-CM | POA: Diagnosis present

## 2020-04-15 DIAGNOSIS — B961 Klebsiella pneumoniae [K. pneumoniae] as the cause of diseases classified elsewhere: Secondary | ICD-10-CM | POA: Diagnosis present

## 2020-04-15 DIAGNOSIS — W010XXD Fall on same level from slipping, tripping and stumbling without subsequent striking against object, subsequent encounter: Secondary | ICD-10-CM | POA: Diagnosis present

## 2020-04-15 DIAGNOSIS — M7989 Other specified soft tissue disorders: Secondary | ICD-10-CM | POA: Diagnosis not present

## 2020-04-15 DIAGNOSIS — F3170 Bipolar disorder, currently in remission, most recent episode unspecified: Secondary | ICD-10-CM | POA: Diagnosis not present

## 2020-04-15 DIAGNOSIS — Z87440 Personal history of urinary (tract) infections: Secondary | ICD-10-CM | POA: Diagnosis not present

## 2020-04-15 DIAGNOSIS — S82202A Unspecified fracture of shaft of left tibia, initial encounter for closed fracture: Secondary | ICD-10-CM | POA: Diagnosis not present

## 2020-04-15 DIAGNOSIS — S82402A Unspecified fracture of shaft of left fibula, initial encounter for closed fracture: Secondary | ICD-10-CM | POA: Diagnosis not present

## 2020-04-15 DIAGNOSIS — F319 Bipolar disorder, unspecified: Secondary | ICD-10-CM | POA: Diagnosis present

## 2020-04-15 DIAGNOSIS — F1010 Alcohol abuse, uncomplicated: Secondary | ICD-10-CM | POA: Diagnosis present

## 2020-04-15 DIAGNOSIS — R509 Fever, unspecified: Secondary | ICD-10-CM | POA: Diagnosis not present

## 2020-04-15 DIAGNOSIS — S82401S Unspecified fracture of shaft of right fibula, sequela: Secondary | ICD-10-CM

## 2020-04-15 DIAGNOSIS — T148XXA Other injury of unspecified body region, initial encounter: Secondary | ICD-10-CM

## 2020-04-15 DIAGNOSIS — E559 Vitamin D deficiency, unspecified: Secondary | ICD-10-CM | POA: Diagnosis present

## 2020-04-15 MED ORDER — DOCUSATE SODIUM 100 MG PO CAPS: 100.0000 mg | ORAL_CAPSULE | Freq: Two times a day (BID) | ORAL | 0 refills | Status: DC

## 2020-04-15 MED ORDER — ALUM & MAG HYDROXIDE-SIMETH 200-200-20 MG/5ML PO SUSP: 30.0000 mL | ORAL | Status: DC | PRN

## 2020-04-15 MED ORDER — METHOCARBAMOL 500 MG PO TABS: 500.0000 mg | ORAL_TABLET | Freq: Four times a day (QID) | ORAL | Status: DC | PRN

## 2020-04-15 MED ORDER — POLYETHYLENE GLYCOL 3350 17 G PO PACK: 17.0000 g | PACK | Freq: Every day | ORAL | Status: DC | PRN

## 2020-04-15 MED ORDER — OXYBUTYNIN CHLORIDE ER 10 MG PO TB24
10.0000 mg | ORAL_TABLET | Freq: Every day | ORAL | Status: DC
  Administered 2020-04-16 – 2020-05-04 (×19): 10 mg via ORAL
  Filled 2020-04-15 (×19): qty 1

## 2020-04-15 MED ORDER — ONDANSETRON HCL 4 MG PO TABS: 4.0000 mg | ORAL_TABLET | Freq: Four times a day (QID) | ORAL | 0 refills | Status: DC | PRN

## 2020-04-15 MED ORDER — OXYCODONE HCL 5 MG PO TABS
10.0000 mg | ORAL_TABLET | ORAL | Status: DC | PRN
  Administered 2020-04-15 – 2020-04-17 (×7): 10 mg via ORAL
  Administered 2020-04-17 – 2020-04-25 (×37): 15 mg via ORAL
  Administered 2020-04-25: 10 mg via ORAL
  Administered 2020-04-25 – 2020-04-27 (×7): 15 mg via ORAL
  Administered 2020-04-28: 10 mg via ORAL
  Administered 2020-04-28: 15 mg via ORAL
  Administered 2020-04-28: 10 mg via ORAL
  Administered 2020-04-28 – 2020-05-04 (×20): 15 mg via ORAL
  Filled 2020-04-15 (×17): qty 3
  Filled 2020-04-15: qty 2
  Filled 2020-04-15 (×2): qty 3
  Filled 2020-04-15: qty 2
  Filled 2020-04-15 (×9): qty 3
  Filled 2020-04-15: qty 2
  Filled 2020-04-15 (×3): qty 3
  Filled 2020-04-15 (×2): qty 2
  Filled 2020-04-15 (×8): qty 3
  Filled 2020-04-15: qty 2
  Filled 2020-04-15 (×14): qty 3
  Filled 2020-04-15: qty 2
  Filled 2020-04-15 (×12): qty 3
  Filled 2020-04-15: qty 2
  Filled 2020-04-15 (×3): qty 3

## 2020-04-15 MED ORDER — ACETAMINOPHEN 325 MG PO TABS
325.0000 mg | ORAL_TABLET | ORAL | Status: DC | PRN
  Administered 2020-04-17 – 2020-05-04 (×19): 650 mg via ORAL
  Filled 2020-04-15 (×19): qty 2

## 2020-04-15 MED ORDER — GABAPENTIN 100 MG PO CAPS: 100.0000 mg | ORAL_CAPSULE | Freq: Three times a day (TID) | ORAL | Status: DC

## 2020-04-15 MED ORDER — PROCHLORPERAZINE EDISYLATE 10 MG/2ML IJ SOLN: 5.0000 mg | Freq: Four times a day (QID) | INTRAMUSCULAR | Status: DC | PRN

## 2020-04-15 MED ORDER — TRAZODONE HCL 50 MG PO TABS
25.0000 mg | ORAL_TABLET | Freq: Every evening | ORAL | Status: DC | PRN
  Filled 2020-04-15 (×4): qty 1

## 2020-04-15 MED ORDER — ENOXAPARIN SODIUM 40 MG/0.4ML ~~LOC~~ SOLN
40.0000 mg | SUBCUTANEOUS | Status: DC
  Administered 2020-04-16 – 2020-05-04 (×19): 40 mg via SUBCUTANEOUS
  Filled 2020-04-15 (×19): qty 0.4

## 2020-04-15 MED ORDER — DOCUSATE SODIUM 100 MG PO CAPS
100.0000 mg | ORAL_CAPSULE | Freq: Two times a day (BID) | ORAL | Status: DC
  Filled 2020-04-15 (×4): qty 1

## 2020-04-15 MED ORDER — OXCARBAZEPINE 150 MG PO TABS
75.0000 mg | ORAL_TABLET | Freq: Two times a day (BID) | ORAL | Status: DC
  Administered 2020-04-15 – 2020-05-04 (×38): 75 mg via ORAL
  Filled 2020-04-15 (×40): qty 0.5

## 2020-04-15 MED ORDER — NAPROXEN 250 MG PO TABS: 250.0000 mg | ORAL_TABLET | Freq: Two times a day (BID) | ORAL | Status: DC

## 2020-04-15 MED ORDER — BISACODYL 10 MG RE SUPP: 10.0000 mg | Freq: Every day | RECTAL | Status: DC | PRN

## 2020-04-15 MED ORDER — ENOXAPARIN SODIUM 40 MG/0.4ML ~~LOC~~ SOLN: 40.0000 mg | SUBCUTANEOUS | Status: DC

## 2020-04-15 MED ORDER — GABAPENTIN 100 MG PO CAPS
100.0000 mg | ORAL_CAPSULE | Freq: Three times a day (TID) | ORAL | Status: DC
  Administered 2020-04-15 – 2020-05-04 (×56): 100 mg via ORAL
  Filled 2020-04-15 (×55): qty 1

## 2020-04-15 MED ORDER — PROCHLORPERAZINE MALEATE 5 MG PO TABS
5.0000 mg | ORAL_TABLET | Freq: Four times a day (QID) | ORAL | Status: DC | PRN
  Administered 2020-04-22 – 2020-04-28 (×3): 10 mg via ORAL
  Filled 2020-04-15 (×3): qty 2

## 2020-04-15 MED ORDER — HYDROMORPHONE HCL 1 MG/ML IJ SOLN: 1.0000 mg | INTRAMUSCULAR | 0 refills | Status: DC | PRN

## 2020-04-15 MED ORDER — LURASIDONE HCL 20 MG PO TABS
20.0000 mg | ORAL_TABLET | Freq: Every day | ORAL | Status: DC
  Administered 2020-04-15 – 2020-05-04 (×20): 20 mg via ORAL
  Filled 2020-04-15 (×20): qty 1

## 2020-04-15 MED ORDER — PROCHLORPERAZINE 25 MG RE SUPP: 12.5000 mg | Freq: Four times a day (QID) | RECTAL | Status: DC | PRN

## 2020-04-15 MED ORDER — FLEET ENEMA 7-19 GM/118ML RE ENEM: 1.0000 | ENEMA | Freq: Once | RECTAL | Status: DC | PRN

## 2020-04-15 MED ORDER — DIPHENHYDRAMINE HCL 12.5 MG/5ML PO ELIX
12.5000 mg | ORAL_SOLUTION | Freq: Four times a day (QID) | ORAL | Status: DC | PRN
  Filled 2020-04-15: qty 10

## 2020-04-15 MED ORDER — OXYCODONE HCL 10 MG PO TABS: 10.0000 mg | ORAL_TABLET | ORAL | 0 refills | Status: DC | PRN

## 2020-04-15 MED ORDER — NAPROXEN 250 MG PO TABS
250.0000 mg | ORAL_TABLET | Freq: Two times a day (BID) | ORAL | Status: DC
  Administered 2020-04-15 – 2020-05-04 (×38): 250 mg via ORAL
  Filled 2020-04-15 (×39): qty 1

## 2020-04-15 MED ORDER — GUAIFENESIN-DM 100-10 MG/5ML PO SYRP: 5.0000 mL | ORAL_SOLUTION | Freq: Four times a day (QID) | ORAL | Status: DC | PRN

## 2020-04-15 NOTE — Progress Notes (Signed)
1545 Discharged pt to [facility] 09 via bed. Pt is A&O x4. Report was given to [facility].

## 2020-04-15 NOTE — Progress Notes (Signed)
Subjective: 5 Days Post-Op Procedure(s) (LRB): OPEN REDUCTION INTERNAL FIXATION (ORIF) TIBIA/FIBULA FRACTURE (Right) Patient reports pain as moderate.    Objective: Vital signs in last 24 hours: Temp:  [98.3 F (36.8 C)-99 F (37.2 C)] 99 F (37.2 C) (06/26 0822) Pulse Rate:  [92-107] 99 (06/26 0822) Resp:  [16-19] 17 (06/26 0822) BP: (99-125)/(67-92) 125/79 (06/26 0822) SpO2:  [92 %-100 %] 96 % (06/26 0822)  Intake/Output from previous day: 06/25 0701 - 06/26 0700 In: -  Out: 1100 [Urine:1100] Intake/Output this shift: No intake/output data recorded.  No results for input(s): HGB in the last 72 hours. No results for input(s): WBC, RBC, HCT, PLT in the last 72 hours. No results for input(s): NA, K, CL, CO2, BUN, CREATININE, GLUCOSE, CALCIUM in the last 72 hours. No results for input(s): LABPT, INR in the last 72 hours.  Neurovascular intact Sensation intact distally Incision: dressing C/D/I   Assessment/Plan: 5 Days Post-Op Procedure(s) (LRB): OPEN REDUCTION INTERNAL FIXATION (ORIF) TIBIA/FIBULA FRACTURE (Right)  Pain management: On chronic pain meds at home current regimen  1. Dilaudid 1 mg q 2hrs PRN 2. Robaxin 500 mg q 6 hrs PRN 3. Naprxen 250 mg BID 4. Oyxcodone 10-15 mg q 4 hrs PRN 5. Gabapentin 100 mg TID  VTE prophylaxis: Lovenox 40 mg daily for 1 month  ID: Ancef for 24 hrs postop for open fracture prophylaxis  Foley/Lines: tolerating PO, KVO IV fluids, no foley  Medical co-morbidities: Bipolar-home med including lurasidone and oxcarbazepine  Impediments to Fracture Healing: Open fracture  Dispo: PT/OT eval, admission to CIR  Follow - up plan: TBD  Chriss Czar 04/15/2020, 10:39 AM

## 2020-04-15 NOTE — Discharge Summary (Signed)
PATIENT ID: Jordan [name]        MRN:  [id number]          DOB/AGE: [birth year] / 54 y.o.    DISCHARGE SUMMARY  ADMISSION DATE:    04/09/2020 DISCHARGE DATE:   04/15/2020   ADMISSION DIAGNOSIS: Fall [W19.XXXA] Type I or II open fracture of distal end of right tibia, unspecified fracture morphology, initial encounter [S82.301B] Open fracture of distal end of fibula and tibia, right, type I or II, initial encounter [S82.301B, S82.831B]    DISCHARGE DIAGNOSIS:  Right open tibia/fibula fracture    ADDITIONAL DIAGNOSIS: Active Problems:   Open fracture of distal end of fibula and tibia, right, type I or II, initial encounter   Closed fracture of medial malleolus of right ankle  Past Medical History:  Diagnosis Date  . Alcohol abuse   . Anemia    patient denies  . Bipolar 1 disorder (HCC)    No medications currently  . CAP (community acquired pneumonia) 03/17/2015  . 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 Mental disorder   . Obesity   . PICC line infection 05/17/2015  . Sepsis due to Gram negative bacteria (MDR E Coli) 02/18/2015  . 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 UTI (lower urinary tract infection)   . Vaginal Pap smear, abnormal     PROCEDURE: Procedure(s): OPEN REDUCTION INTERNAL FIXATION (ORIF) TIBIA/FIBULA FRACTURE Right on 04/10/2020  CONSULTS: PT/OT/CIR Treatment Team:  Shona Needles, MD   HISTORY:  See H&P in chart  HOSPITAL COURSE:  Jordan [name] is a 53 y.o. admitted on 04/09/2020 and found to have a diagnosis of Right open tibia/fibula fracture.  After appropriate laboratory studies were obtained  they were taken to the operating room on 04/09/2020 and underwent  I&D with placement of external fixator Taken back to OR on 04/10/2020 for definitive fixation with  Procedure(s): OPEN REDUCTION INTERNAL FIXATION (ORIF) TIBIA/FIBULA FRACTURE  Right.   They were given perioperative antibiotics:  Anti-infectives (From admission, onward)    Start     Dose/Rate Route Frequency Ordered Stop   04/10/20 1400  ceFAZolin (ANCEF) IVPB 2g/100 mL premix        2 g 200 mL/hr over 30 Minutes Intravenous Every 8 hours 04/10/20 1347 04/11/20 0658   04/10/20 1033  tobramycin (NEBCIN) powder  Status:  Discontinued          As needed 04/10/20 1033 04/10/20 1218   04/10/20 1033  vancomycin (VANCOCIN) powder  Status:  Discontinued          As needed 04/10/20 1033 04/10/20 1218   04/10/20 0600  ceFAZolin (ANCEF) IVPB 2g/100 mL premix  Status:  Discontinued        2 g 200 mL/hr over 30 Minutes Intravenous On call to O.R. 04/09/20 1319 04/09/20 1729   04/09/20 1545  ceFAZolin (ANCEF) IVPB 2g/100 mL premix        2 g 200 mL/hr over 30 Minutes Intravenous Every 8 hours 04/09/20 1534 04/10/20 1005   04/09/20 1500  vancomycin (VANCOCIN) powder  Status:  Discontinued          As needed 04/09/20 1501 04/09/20 1526   04/09/20 1459  tobramycin (NEBCIN) powder  Status:  Discontinued          As needed 04/09/20 1500 04/09/20 1526   04/09/20 1145  ceFAZolin (ANCEF) IVPB 1 g/50 mL premix        1 g 100 mL/hr over 30 Minutes  Intravenous  Once 04/09/20 1134 04/09/20 1305    .  Tolerated the procedure well.  Placed with a foley intraoperatively.    Wound vac placed.  POD #1, allowed out of bed to a chair.  PT for ambulation and exercise program. Non weight bearing. Foley D/C'd in morning.  IV saline locked.  O2 discontionued.  Remaining hospital course: continued PT and ambulation. The remainder of the hospital course was dedicated to ambulation and strengthening.  Pain control, incisional vac d/c. The patient was discharged on 5 Days Post-Op in  Stable condition.  Blood products given:none  DIAGNOSTIC STUDIES: Recent vital signs:  Patient Vitals for the past 24 hrs:  BP Temp Temp src Pulse Resp SpO2  04/15/20 0822 125/79 99 F (37.2 C) Oral 99 17 96 %  04/15/20 0350 (!) 110/92 98.4 F (36.9 C) Oral 100 16 98 %  04/14/20 2138 125/71 98.9 F (37.2  C) -- (!) 107 19 98 %  04/14/20 2048 99/87 98.9 F (37.2 C) Oral (!) 105 16 92 %  04/14/20 1438 125/67 98.3 F (36.8 C) Oral 92 17 100 %       Recent laboratory studies: Recent Labs    04/09/20 1036 04/09/20 1822 04/10/20 0352 04/11/20 0317  WBC 7.8 8.3 8.8 7.4  HGB 13.7 13.0 12.8 10.1*  HCT 40.8 38.5 35.8* 29.7*  PLT 159 163 179 168   Recent Labs    04/09/20 1036 04/09/20 1822 04/10/20 0352  NA 139  --  138  K 4.5  --  4.4  CL 106  --  105  CO2 25  --  24  BUN 11  --  9  CREATININE 0.71 0.83 0.81  GLUCOSE 115*  --  119*  CALCIUM 8.9  --  8.8*   Lab Results  Component Value Date   INR 1.04 02/18/2015   INR 1.04 01/24/2015   INR 1.10 01/05/2015     Recent Radiographic Studies :  DG Tibia/Fibula Right  Result Date: 04/10/2020 CLINICAL DATA:  55 year old female with ORIF of the distal tibial fracture. EXAM: RIGHT TIBIA AND FIBULA - 2 VIEW; DG C-ARM 1-60 MIN COMPARISON:  Prior radiograph dated 04/09/2020. FINDINGS: Seven intraoperative fluoroscopic spot images provided. The total fluoroscopy time is 28.4 seconds. Comminuted and displaced fracture of the distal tibial diaphysis as well as displaced fracture of the distal fibula. Displaced fracture of the medial malleolus. Interval placement of a fixation sideplate and screw along the distal tibia. IMPRESSION: Intraoperative fluoroscopy for internal fixation of the distal tibia. Electronically Signed   By: Anner Crete M.D.   On: 04/10/2020 15:41   DG Tibia/Fibula Right  Result Date: 04/09/2020 CLINICAL DATA:  Fractures of the distal tibia and fibula. EXAM: RIGHT TIBIA AND FIBULA - 2 VIEW COMPARISON:  Tibia and fibula radiographs performed the same day. FINDINGS: Intraoperative fluoroscopy images demonstrate external fixation hardware with screws in the proximal tibia and calcaneus. The comminuted tibia and fibula fractures are in improved alignment. The hardware appears intact. IMPRESSION: Improved alignment of the  comminuted tibia and fibula fractures after external fixation. Electronically Signed   By: Zerita Boers M.D.   On: 04/09/2020 15:41   CT ANKLE RIGHT WO CONTRAST  Result Date: 04/09/2020 CLINICAL DATA:  Right ankle fracture, status post external fixator EXAM: CT OF THE RIGHT ANKLE WITHOUT CONTRAST TECHNIQUE: Multidetector CT imaging of the right ankle was performed according to the standard protocol. Multiplanar CT image reconstructions were also generated. COMPARISON:  None. FINDINGS: Bones/Joint/Cartilage There  is a comminuted mildly displaced fracture seen of the distal tibia with small fracture fragment seen displaced. There is also obliquely oriented comminuted fracture of the distal fibular shaft. There is a mildly displaced medial malleolar fracture is well. A nondisplaced fracture seen through the lateral talar dome extending anteriorly through the midbody. The ankle mortise appears to be slightly widened measuring 9 mm in diameter. Ligaments Suboptimally assessed by CT. Muscles and Tendons The muscles surrounding the ankle appear to be grossly intact without evidence of focal atrophy or tear. The visualized portions of the tendons are intact. The Achilles tendon is intact. Soft tissues External fixation seen along the hindfoot. There is diffuse subcutaneous edema surrounding the ankle. A moderate ankle joint effusion is noted. IMPRESSION: Comminuted mildly displaced fracture seen through the distal tibia and fibular shafts. Mildly displaced medial malleolar fracture with widening of the ankle mortise measuring 9 mm. Nondisplaced fracture seen through the anterolateral talar dome and midbody. Electronically Signed   By: Prudencio Pair M.D.   On: 04/09/2020 22:11   DG Tibia/Fibula Right Port  Result Date: 04/10/2020 CLINICAL DATA:  Status post surgical internal fixation of left tibial fracture. EXAM: PORTABLE RIGHT TIBIA AND FIBULA - 2 VIEW COMPARISON:  April 09, 2020. FINDINGS: Status post surgical  internal fixation of distal left tibial fracture. Good alignment of fracture components is noted. Stable appearance of right fibular fractures. IMPRESSION: Status post surgical internal fixation of distal left tibial fracture. Electronically Signed   By: Marijo Conception M.D.   On: 04/10/2020 15:17   DG Tibia/Fibula Right Port  Result Date: 04/09/2020 CLINICAL DATA:  Open fracture EXAM: PORTABLE RIGHT TIBIA AND FIBULA - 2 VIEW COMPARISON:  None. FINDINGS: Acute comminuted fracture of the distal tibial diaphysis with 1.4 cm of posterior displacement and varus angulation. Multiple comminuted tibial fracture fragments, the 2 largest measuring up to 4.6 cm and 2.7 cm. There is also an acute fracture of the distal fibular diaphysis with 1.0 cm of lateral displacement and 0.5 cm of anterior displacement as well as a similar degree of varus angulation. There is an additional obliquely oriented nondisplaced fracture of the proximal fibular diaphysis. Alignment at the knee and ankle appear anatomic on large field-of-view image. There is prominent soft tissue swelling at the fracture site. IMPRESSION: 1. Acute comminuted angulated fractures of the distal tibial and fibular diaphyses as described above. 2. Nondisplaced obliquely oriented fracture of the proximal fibular diaphysis. Electronically Signed   By: Davina Poke D.O.   On: 04/09/2020 11:47   DG Ankle Right Port  Result Date: 04/10/2020 CLINICAL DATA:  Status post surgical fixation of right tibial fracture. EXAM: PORTABLE RIGHT ANKLE - 2 VIEW COMPARISON:  April 09, 2020. FINDINGS: Status post surgical internal fixation of comminuted and displaced distal right tibial fracture. Good alignment of fracture components is noted. IMPRESSION: Status post surgical internal fixation of comminuted and displaced distal right tibial fracture. Electronically Signed   By: Marijo Conception M.D.   On: 04/10/2020 15:18   DG Ankle Right Port  Result Date: 04/09/2020 CLINICAL  DATA:  Postoperative evaluation. EXAM: PORTABLE RIGHT ANKLE - 2 VIEW COMPARISON:  None. FINDINGS: Acute fracture deformities are seen involving the distal shafts of the right tibia and right fibula. Gross anatomic alignment is seen. An additional mildly displaced fracture of the right lateral malleolus is noted. There is mild widening of the right ankle mortise without evidence of dislocation. There is no evidence of arthropathy or other focal bone abnormality.  Metallic density external fixation hardware is seen overlying the mid right foot and proximal portion of the right tibial shaft. Diffuse soft tissue swelling is seen. IMPRESSION: Status post external fixation of acute fractures of the distal right tibia and right fibula. Electronically Signed   By: Virgina Norfolk M.D.   On: 04/09/2020 15:58   DG C-Arm 1-60 Min  Result Date: 04/10/2020 CLINICAL DATA:  54 year old female with ORIF of the distal tibial fracture. EXAM: RIGHT TIBIA AND FIBULA - 2 VIEW; DG C-ARM 1-60 MIN COMPARISON:  Prior radiograph dated 04/09/2020. FINDINGS: Seven intraoperative fluoroscopic spot images provided. The total fluoroscopy time is 28.4 seconds. Comminuted and displaced fracture of the distal tibial diaphysis as well as displaced fracture of the distal fibula. Displaced fracture of the medial malleolus. Interval placement of a fixation sideplate and screw along the distal tibia. IMPRESSION: Intraoperative fluoroscopy for internal fixation of the distal tibia. Electronically Signed   By: Anner Crete M.D.   On: 04/10/2020 15:41   DG C-Arm 1-60 Min  Result Date: 04/09/2020 CLINICAL DATA:  Tibia and fibular fractures EXAM: DG C-ARM 1-60 MIN CONTRAST:  None FLUOROSCOPY TIME:  Fluoroscopy Time:  0 minutes 26 seconds Number of Acquired Spot Images: 0 COMPARISON:  Tibia and fibula radiographs performed the same day. FINDINGS: Intraoperative fluoroscopy images demonstrate external fixation hardware with screws in the proximal  tibia and calcaneus. The comminuted tibia and fibula fractures are in improved alignment. The hardware appears intact. IMPRESSION: Improved alignment of the comminuted tibia and fibula fractures after external fixation. Electronically Signed   By: Zerita Boers M.D.   On: 04/09/2020 15:42    DISCHARGE INSTRUCTIONS:   DISCHARGE MEDICATIONS:   Allergies as of 04/15/2020      Reactions   Ciprofloxacin Swelling   Lips swell, tongue swells, face swells   Citalopram Other (See Comments)   Possible cause of pancytopenia. Swelling of tongue, face and throat   Lamotrigine Other (See Comments)   Possible cause of pancytopenia. Swelling of face, throat and tongue   Sulfa Antibiotics Anaphylaxis   Tramadol Anaphylaxis, Shortness Of Breath, Swelling      Medication List    STOP taking these medications   estradiol 0.05 mg/24hr patch Commonly known as: CLIMARA - Dosed in mg/24 hr   ibuprofen 600 MG tablet Commonly known as: ADVIL     TAKE these medications   acetaminophen 500 MG tablet Commonly known as: TYLENOL Take 1-2 tablets (500-1,000 mg total) by mouth every 6 (six) hours as needed for moderate pain.   docusate sodium 100 MG capsule Commonly known as: COLACE Take 1 capsule (100 mg total) by mouth 2 (two) times daily.   enoxaparin 40 MG/0.4ML injection Commonly known as: LOVENOX Inject 0.4 mLs (40 mg total) into the skin daily. Start taking on: April 16, 2020   gabapentin 100 MG capsule Commonly known as: NEURONTIN Take 1 capsule (100 mg total) by mouth 3 (three) times daily.   HYDROmorphone 1 MG/ML injection Commonly known as: DILAUDID Inject 1 mL (1 mg total) into the vein every 2 (two) hours as needed for severe pain (pain score 7-10, uncontrolled pain with PO analgesic).   Iron 325 (65 Fe) MG Tabs Take 1 tablet by mouth every other day.   Latuda 20 MG Tabs tablet Generic drug: lurasidone Take 20 mg by mouth daily.   methocarbamol 500 MG tablet Commonly known as:  ROBAXIN Take 1 tablet (500 mg total) by mouth every 6 (six) hours as needed for muscle  spasms.   naproxen 250 MG tablet Commonly known as: NAPROSYN Take 1 tablet (250 mg total) by mouth 2 (two) times daily with a meal.   ondansetron 4 MG tablet Commonly known as: ZOFRAN Take 1 tablet (4 mg total) by mouth every 6 (six) hours as needed for nausea.   OXcarbazepine 150 MG tablet Commonly known as: TRILEPTAL Take 75 mg by mouth in the morning and at bedtime.   oxybutynin 10 MG 24 hr tablet Commonly known as: DITROPAN-XL Take 10 mg by mouth daily.   Oxycodone HCl 10 MG Tabs Take 1-1.5 tablets (10-15 mg total) by mouth every 4 (four) hours as needed for moderate pain (pain score 4-6). What changed:   how much to take  when to take this  reasons to take this  Another medication with the same name was removed. Continue taking this medication, and follow the directions you see here.   Vitamin D (Ergocalciferol) 1.25 MG (50000 UNIT) Caps capsule Commonly known as: DRISDOL Take 50,000 Units by mouth once a week.       FOLLOW UP VISIT:  [facility] TBD  DISPOSITION:   Inpatient Rehab   CONDITION:  Stable  Chriss Czar, PA-C  04/15/2020 10:52 AM

## 2020-04-15 NOTE — H&P (Addendum)
Physical Medicine and Rehabilitation Admission H&P    Chief Complaint  Patient presents with  . Functional deficits  . Left tib/fib fx and NWB LLE    HPI: Jordan [name] is a 54 year old female with history of bipolar disorder, Vaginal cancer, frequent UTIs, chronic back pain, ETOH abuse, morbid obesity who was admitted on 04/09/20 after a slipping on puddle of water with fall at her boyfriend's facility and sustained  acute comminuted fracture of distal tibial diaphysis with 1.4 cm displacement and multiple comminuted tibial fracture fragments and acute fracture of fibula diaphysis with 2 cm lateral and 0.5 cm anterior displacement, mildly displaced medial malleolus fracture and nondisplaced talar fracture.  She underwent I & D with external fixation of tib/fib fractures by Dr. Griffin Basil. Dr. Doreatha Martin consulted due to complexity of fracture and patient taken to OR for ORIF right tibial shaft, I & D open right tibia fracture and closer treatment of right medial malleolus Fx on 06/21. Post op NWB on RLE and incisional wound VAC to stay in place till next Wed/Thurs. Therapy ongoing and patient continues to be limited by NWB status and requires visual/verbal/tactile cues to maintain precautions, has anxiety and pain affecting functional status with difficulty following multi step commands. CIR recommended due to functional deficits.    Pt reports LBM 7 days ago- this is normal for her- she absolutely REFUSES any bowel meds of any kind- and has her whole life- doesn't want to have a BM now, but explained needs to have one before getting started in CIR. Having gas/passing gas, and very angry that we had to discuss "that subject".  Pain meds working for RLE pain- using ice pack currently.  Wants ice pack and Kpad orders.  Wound VAC was d/c'd Also says her bladder is weak- and needs purewick- explained we can do during night, but not during day- can do depends during day, which she did at home.       Review of Systems  Constitutional: Negative for chills and fever.  HENT: Negative for hearing loss and tinnitus.   Eyes: Negative for blurred vision and double vision.  Respiratory: Negative for cough and shortness of breath.   Cardiovascular: Negative for chest pain and palpitations.  Gastrointestinal: Positive for constipation. Negative for abdominal pain, heartburn and nausea.  Genitourinary: Positive for frequency and urgency.       Wears depends.   Musculoskeletal: Positive for joint pain (right ankle pain).  Skin: Negative for rash.  Neurological: Negative for dizziness, focal weakness and headaches.  Psychiatric/Behavioral: The patient has insomnia. The patient is not nervous/anxious.        Does not like being pushed--it makes her mad. Need to keep conversation/things have to be simple and give her time to let her process.   All other systems reviewed and are negative.    Past Medical History:  Diagnosis Date  . Alcohol abuse   . Anemia    patient denies  . Bipolar 1 disorder (HCC)    No medications currently  . CAP (community acquired pneumonia) 03/17/2015  . 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 Mental disorder   . Obesity   . PICC line infection 05/17/2015  . Sepsis due to Gram negative bacteria (MDR E Coli) 02/18/2015  . 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 UTI (lower urinary tract infection)   . Vaginal Pap smear, abnormal     Past Surgical History:  Procedure Laterality Date  .  ABDOMINAL HYSTERECTOMY    . CERVICAL CONIZATION W/BX N/A 07/01/2016   Procedure: CONIZATION CERVIX WITH BIOPSY;  Surgeon: Chancy Milroy, MD;  Location: [facility];  Service: Gynecology;  Laterality: N/A;  . CHOLECYSTECTOMY    . EXTERNAL FIXATION LEG Right 04/09/2020   Procedure: EXTERNAL FIXATION LEG;  Surgeon: Hiram Gash, MD;  Location: [facility];  Service: Orthopedics;  Laterality: Right;  . HYSTEROSCOPY WITH D & C N/A 01/25/2016   Procedure: DILATATION AND  CURETTAGE /HYSTEROSCOPY;  Surgeon: Mora Bellman, MD;  Location: [facility];  Service: Gynecology;  Laterality: N/A;  . LYMPH NODE BIOPSY Bilateral 07/22/2019   Procedure: LYMPH NODE BIOPSY;  Surgeon: Everitt Amber, MD;  Location: [facility];  Service: Gynecology;  Laterality: Bilateral;  . OPEN REDUCTION INTERNAL FIXATION (ORIF) TIBIA/FIBULA FRACTURE Right 04/10/2020   Procedure: OPEN REDUCTION INTERNAL FIXATION (ORIF) TIBIA/FIBULA FRACTURE;  Surgeon: Shona Needles, MD;  Location: [facility];  Service: Orthopedics;  Laterality: Right;  . ROBOT ASSISTED MYOMECTOMY N/A 07/22/2019   Procedure: XI ROBOTIC ASSISTED LAPAROSCOPIC RADICAL UPPER VAGINECTOMY, LEFT SALPINECTOMY, RIGHT SALPINGOOOPHERECTOMY;  Surgeon: Everitt Amber, MD;  Location: [facility];  Service: Gynecology;  Laterality: N/A;  . VAGINAL HYSTERECTOMY N/A 03/11/2017   Procedure: HYSTERECTOMY VAGINAL WITH MORCELLATION;  Surgeon: Chancy Milroy, MD;  Location: [facility];  Service: Gynecology;  Laterality: N/A;    Family History  Problem Relation Age of Onset  . Alcohol abuse Father   . Cancer Father   . Breast cancer Neg Hx     Social History: Lives alone was independent PTA. Her boyfriend has multiple health issues --gave up on himself and is at [facility]. Her neighbor and landlord are good friends/like family. She goes to facility daily. She  reports that she has never smoked. She has never used smokeless tobacco. She reports previous alcohol use--quit "100 yrs ago".  She reports that she does not use drugs.   Allergies  Allergen Reactions  . Ciprofloxacin Swelling    Lips swell, tongue swells, face swells  . Citalopram Other (See Comments)    Possible cause of pancytopenia. Swelling of tongue, face and throat  . Lamotrigine Other (See Comments)    Possible cause of pancytopenia. Swelling of face, throat and tongue  . Sulfa Antibiotics Anaphylaxis  . Tramadol Anaphylaxis, Shortness Of Breath and Swelling    Medications Prior to  Admission  Medication Sig Dispense Refill  . acetaminophen (TYLENOL) 500 MG tablet Take 1-2 tablets (500-1,000 mg total) by mouth every 6 (six) hours as needed for moderate pain. 30 tablet 1  . docusate sodium (COLACE) 100 MG capsule Take 1 capsule (100 mg total) by mouth 2 (two) times daily. 10 capsule 0  . [START ON 04/16/2020] enoxaparin (LOVENOX) 40 MG/0.4ML injection Inject 0.4 mLs (40 mg total) into the skin daily. 0 mL   . Ferrous Sulfate (IRON) 325 (65 Fe) MG TABS Take 1 tablet by mouth every other day.    . gabapentin (NEURONTIN) 100 MG capsule Take 1 capsule (100 mg total) by mouth 3 (three) times daily.    Marland Kitchen HYDROmorphone (DILAUDID) 1 MG/ML injection Inject 1 mL (1 mg total) into the vein every 2 (two) hours as needed for severe pain (pain score 7-10, uncontrolled pain with PO analgesic). 1 mL 0  . lurasidone (LATUDA) 20 MG TABS tablet Take 20 mg by mouth daily.    . methocarbamol (ROBAXIN) 500 MG tablet Take 1 tablet (500 mg total) by mouth every 6 (six) hours as needed  for muscle spasms.    . naproxen (NAPROSYN) 250 MG tablet Take 1 tablet (250 mg total) by mouth 2 (two) times daily with a meal.    . ondansetron (ZOFRAN) 4 MG tablet Take 1 tablet (4 mg total) by mouth every 6 (six) hours as needed for nausea. 20 tablet 0  . OXcarbazepine (TRILEPTAL) 150 MG tablet Take 75 mg by mouth in the morning and at bedtime.    Marland Kitchen oxybutynin (DITROPAN-XL) 10 MG 24 hr tablet Take 10 mg by mouth daily.    Marland Kitchen oxyCODONE 10 MG TABS Take 1-1.5 tablets (10-15 mg total) by mouth every 4 (four) hours as needed for moderate pain (pain score 4-6). 30 tablet 0  . Vitamin D, Ergocalciferol, (DRISDOL) 1.25 MG (50000 UT) CAPS capsule Take 50,000 Units by mouth once a week.      Drug Regimen Review  Drug regimen was reviewed and remains appropriate with no significant issues identified  Home: Home Living Family/patient expects to be discharged to:: Private residence Living Arrangements: Alone   Functional  History:    Functional Status:  Mobility:          ADL:    Cognition: Cognition Orientation Level: Oriented X4     Blood pressure 120/73, pulse (!) 107, temperature 98.6 F (37 C), temperature source Oral, resp. rate 18, height 5\' 8"  (1.727 m), weight (!) 147.6 kg, SpO2 99 %. Physical Exam Vitals and nursing note reviewed.  Constitutional:      General: She is not in acute distress.    Appearance: Normal appearance. She is obese. She is not toxic-appearing.     Comments: Morbidly obese. Any movement of RLE causes anxiety. Loud and explosive at times.  Got extremely angry per pt, because she had to discuss bowel movements- she said she can go "days-weeks- and wants to control it, -denies constipation feeling, NAD Refused to let me assess buttocks-"I don't have wounds anywhere near my buttocks".   HENT:     Head: Normocephalic and atraumatic.     Comments: Smile equal    Right Ear: External ear normal.     Left Ear: External ear normal.     Nose: Nose normal. No congestion.     Mouth/Throat:     Mouth: Mucous membranes are dry.     Pharynx: Oropharynx is clear. No oropharyngeal exudate.  Eyes:     General:        Right eye: No discharge.        Left eye: No discharge.     Extraocular Movements: Extraocular movements intact.  Cardiovascular:     Rate and Rhythm: Tachycardia present.     Comments: Tachycardic, regular rhythm- no M/R/G Pulmonary:     Comments: CTA B/L- no W/R/R- good air movement Abdominal:     Comments: Significantly distended vs protuberant- refuses deep palpation, but appears to be NT; hypoactive BS  Genitourinary:    Comments: purewick in place Musculoskeletal:     Cervical back: Normal range of motion and neck supple. No rigidity.     Comments: UEs B/L 5/5 in deltoid, biceps, triceps, grip and finger abd B/L LLE- 5/5 in HF, KE, KF, DF and PF RLE_ wouldn't let me test- but HF appears 2 to 2-/5, KE/KF 3-/5, and unable to test DF/PF due to CAM  boot  Skin:    General: Skin is warm and dry.     Comments: LUE IV- no infiltrate Bruising from prior IVs in arms Dressing on RLE- with  ACE wrap over kerlex- was able ot look at pic on phone of it dated yesterday- incisions intact- no drainage, no erythema except localized erythema around incisions  Neurological:     Mental Status: She is alert.     Comments: Warm, and can wiggle toes on RLE Sensation intact to light touch in all 4 extremities   Psychiatric:     Comments: Emotionally labile- got very angry 2 separate times and said couldn't keep discussing topic Also very anxious about coming to CIR- Stayed an additional 30 minutes attempting to assuage concerns, because pt asking same questions over and over- said she did that to get a different answer     No results found for this or any previous visit (from the past 48 hour(s)). No results found.     Medical Problem List and Plan: 1.  Impaired function secondary to RLE tib/fib fx- is NWB on RLE  -patient may  Shower if RLE is covered  -ELOS/Goals: 14-18 days- mod I to supervision 2.  Antithrombotics: -DVT/anticoagulation:  Pharmaceutical: Lovenox  -antiplatelet therapy: N/A 3. Pain Management: Was on oxycodone 10 mg--reports was taking 1-2 x day prn. Continue gabapentin tid, Naprosyn bid. Requires oxycodone every 4 hours prn.  Pain control remains an issues--question add OxyContin v/s po dilaudid. Will need to premedicate prior to therapy sessions.  -was extremely clear with pt to look at schedule and take meds before therapy- explained insurance will not pay for CIR if she doesn't do therapy- was already trying to say she wasn't sure about doing 3 hours/day; will add Kpad and ice prn 4. Mood: LCSW to follow for evaluation and support.   -antipsychotic agents: On Latuda 5. Neuropsych: This patient is capable of making decisions on her own behalf. 6. Skin/Wound Care: Routine pressure relief measures.  -needs dressing changes  daily and prn 7. Fluids/Electrolytes/Nutrition: Monitor I/O. Check lytes in am and on Wed 8. Bipolar disorder: On Trileptal and Latuda.  9. H/o Nocturia/frequent UTIs: On ditropan. -uses depends at home- will allow purewick at night, at least initiually per pt specific request- cannot do during day  10. Constipation: Has not had BM--does not want any laxatives  "wants to do on her own" -no BM in 7+ days- but since this is "normal" refuses all bowel meds  Ivan Anchors. Love, PA-C 04/15/2020    I have personally performed a face to face diagnostic evaluation of this patient and formulated the key components of the plan.  Additionally, I have personally reviewed laboratory data, imaging studies, as well as relevant notes and concur with the physician assistant's documentation above.   The patient's status has not changed from the original H&P.  Any changes in documentation from the acute care chart have been noted above.   I spent 1 hour 10 minutes with pt due to her wanting me to speak with her neighbor and her boyfriend on the phone to calm her/them down about plans- I did so, however total time spent on admission was 2 hours- 30 minutes reading chart, 70 minutes with pt, and 20 minutes typing up admission.   Courtney Heys, MD 04/15/2020

## 2020-04-15 NOTE — Progress Notes (Signed)
Pt was admitted to unit approx 1620. Transported via bed. A&O x 4. C/o pain to RLE. Cam boot in place. Reviewed safety plan and precautions with patient as well as therapy schedules. Incisions to RLE dry and intact. Call bell placed in reach. Pt got bed bath with new purewick placed per request. Will cont to monitor.   Erie Noe, RN

## 2020-04-16 ENCOUNTER — Inpatient Hospital Stay (HOSPITAL_COMMUNITY): Payer: Medicaid Other

## 2020-04-16 ENCOUNTER — Inpatient Hospital Stay (HOSPITAL_COMMUNITY): Payer: Medicaid Other | Admitting: Occupational Therapy

## 2020-04-16 LAB — CBC WITH DIFFERENTIAL/PLATELET
Abs Immature Granulocytes: 0.03 10*3/uL (ref 0.00–0.07)
Basophils Absolute: 0 10*3/uL (ref 0.0–0.1)
Basophils Relative: 0 %
Eosinophils Absolute: 0 10*3/uL (ref 0.0–0.5)
Eosinophils Relative: 0 %
HCT: 31 % — ABNORMAL LOW (ref 36.0–46.0)
Hemoglobin: 10.3 g/dL — ABNORMAL LOW (ref 12.0–15.0)
Immature Granulocytes: 1 %
Lymphocytes Relative: 18 %
Lymphs Abs: 0.9 10*3/uL (ref 0.7–4.0)
MCH: 32.7 pg (ref 26.0–34.0)
MCHC: 33.2 g/dL (ref 30.0–36.0)
MCV: 98.4 fL (ref 80.0–100.0)
Monocytes Absolute: 0.5 10*3/uL (ref 0.1–1.0)
Monocytes Relative: 10 %
Neutro Abs: 3.6 10*3/uL (ref 1.7–7.7)
Neutrophils Relative %: 71 %
Platelets: 191 10*3/uL (ref 150–400)
RBC: 3.15 MIL/uL — ABNORMAL LOW (ref 3.87–5.11)
RDW: 14.2 % (ref 11.5–15.5)
WBC: 5 10*3/uL (ref 4.0–10.5)
nRBC: 0 % (ref 0.0–0.2)

## 2020-04-16 LAB — COMPREHENSIVE METABOLIC PANEL
ALT: 49 U/L — ABNORMAL HIGH (ref 0–44)
AST: 32 U/L (ref 15–41)
Albumin: 2.9 g/dL — ABNORMAL LOW (ref 3.5–5.0)
Alkaline Phosphatase: 142 U/L — ABNORMAL HIGH (ref 38–126)
Anion gap: 8 (ref 5–15)
BUN: 10 mg/dL (ref 6–20)
CO2: 27 mmol/L (ref 22–32)
Calcium: 8.5 mg/dL — ABNORMAL LOW (ref 8.9–10.3)
Chloride: 100 mmol/L (ref 98–111)
Creatinine, Ser: 0.61 mg/dL (ref 0.44–1.00)
GFR calc Af Amer: >60 mL/min (ref >60)
GFR calc non Af Amer: >60 mL/min (ref >60)
Glucose, Bld: 120 mg/dL — ABNORMAL HIGH (ref 70–99)
Potassium: 4.6 mmol/L (ref 3.5–5.1)
Sodium: 135 mmol/L (ref 135–145)
Total Bilirubin: 0.9 mg/dL (ref 0.3–1.2)
Total Protein: 5.2 g/dL — ABNORMAL LOW (ref 6.5–8.1)

## 2020-04-16 NOTE — Progress Notes (Signed)
RN called into room due to pt adamant that she wants to use pure wick now. Explained to pt that [place] only supposed to be used at night if used at all. To help keep anxiety from going out the roof, purewick placed and explained to pt that tomorrow purewick will be removed in the morning prior to therapy and only placed at night time around 8pm. Pt acknowledged this was ok and she agreed. Erie Noe, RN

## 2020-04-16 NOTE — Plan of Care (Signed)
  Problem: RH Grooming Goal: LTG Patient will perform grooming w/assist,cues/equip (OT) Description: LTG: Patient will perform grooming with assist, with/without cues using equipment (OT) Flowsheets (Taken 04/16/2020 1611) LTG: Pt will perform grooming with assistance level of: Independent with assistive device    Problem: RH Bathing Goal: LTG Patient will bathe all body parts with assist levels (OT) Description: LTG: Patient will bathe all body parts with assist levels (OT) Flowsheets (Taken 04/16/2020 1611) LTG: Pt will perform bathing with assistance level/cueing: Set up assist    Problem: RH Dressing Goal: LTG Patient will perform upper body dressing (OT) Description: LTG Patient will perform upper body dressing with assist, with/without cues (OT). Flowsheets (Taken 04/16/2020 1611) LTG: Pt will perform upper body dressing with assistance level of: Independent with assistive device Goal: LTG Patient will perform lower body dressing w/assist (OT) Description: LTG: Patient will perform lower body dressing with assist, with/without cues in positioning using equipment (OT) Flowsheets (Taken 04/16/2020 1611) LTG: Pt will perform lower body dressing with assistance level of: Independent with assistive device   Problem: RH Toileting Goal: LTG Patient will perform toileting task (3/3 steps) with assistance level (OT) Description: LTG: Patient will perform toileting task (3/3 steps) with assistance level (OT)  Flowsheets (Taken 04/16/2020 1611) LTG: Pt will perform toileting task (3/3 steps) with assistance level: Independent with assistive device   Problem: RH Toilet Transfers Goal: LTG Patient will perform toilet transfers w/assist (OT) Description: LTG: Patient will perform toilet transfers with assist, with/without cues using equipment (OT) Flowsheets (Taken 04/16/2020 1611) LTG: Pt will perform toilet transfers with assistance level of: Independent with assistive device   Problem: RH  Tub/Shower Transfers Goal: LTG Patient will perform tub/shower transfers w/assist (OT) Description: LTG: Patient will perform tub/shower transfers with assist, with/without cues using equipment (OT) Flowsheets (Taken 04/16/2020 1611) LTG: Pt will perform tub/shower stall transfers with assistance level of: Set up assist

## 2020-04-16 NOTE — Evaluation (Signed)
Occupational Therapy Assessment and Plan  Patient Details  Name: Hasna Stefanik MRN: [id number] Date of Birth: 01-25-1966  OT Diagnosis: abnormal posture, acute pain, muscle weakness (generalized) and swelling of limb Rehab Potential: Rehab Potential (ACUTE ONLY): Good ELOS: 14-16 days   Today's Date: 04/16/2020 OT Individual Time: 1423-9532 OT Individual Time Calculation (min): 72 min     Hospital Problem: Active Problems:   Tibia/fibula fracture, left, closed, initial encounter   Past Medical History:  Past Medical History:  Diagnosis Date  . Alcohol abuse   . Anemia    patient denies  . Bipolar 1 disorder (HCC)    No medications currently  . CAP (community acquired pneumonia) 03/17/2015  . 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 Mental disorder   . Obesity   . PICC line infection 05/17/2015  . Sepsis due to Gram negative bacteria (MDR E Coli) 02/18/2015  . 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 UTI (lower urinary tract infection)   . Vaginal Pap smear, abnormal    Past Surgical History:  Past Surgical History:  Procedure Laterality Date  . ABDOMINAL HYSTERECTOMY    . CERVICAL CONIZATION W/BX N/A 07/01/2016   Procedure: CONIZATION CERVIX WITH BIOPSY;  Surgeon: Chancy Milroy, MD;  Location: [facility];  Service: Gynecology;  Laterality: N/A;  . CHOLECYSTECTOMY    . EXTERNAL FIXATION LEG Right 04/09/2020   Procedure: EXTERNAL FIXATION LEG;  Surgeon: Hiram Gash, MD;  Location: [facility];  Service: Orthopedics;  Laterality: Right;  . HYSTEROSCOPY WITH D & C N/A 01/25/2016   Procedure: DILATATION AND CURETTAGE /HYSTEROSCOPY;  Surgeon: Mora Bellman, MD;  Location: [facility];  Service: Gynecology;  Laterality: N/A;  . LYMPH NODE BIOPSY Bilateral 07/22/2019   Procedure: LYMPH NODE BIOPSY;  Surgeon: Everitt Amber, MD;  Location: [facility];  Service: Gynecology;  Laterality: Bilateral;  . OPEN REDUCTION INTERNAL FIXATION (ORIF) TIBIA/FIBULA FRACTURE Right 04/10/2020    Procedure: OPEN REDUCTION INTERNAL FIXATION (ORIF) TIBIA/FIBULA FRACTURE;  Surgeon: Shona Needles, MD;  Location: [facility];  Service: Orthopedics;  Laterality: Right;  . ROBOT ASSISTED MYOMECTOMY N/A 07/22/2019   Procedure: XI ROBOTIC ASSISTED LAPAROSCOPIC RADICAL UPPER VAGINECTOMY, LEFT SALPINECTOMY, RIGHT SALPINGOOOPHERECTOMY;  Surgeon: Everitt Amber, MD;  Location: [facility];  Service: Gynecology;  Laterality: N/A;  . VAGINAL HYSTERECTOMY N/A 03/11/2017   Procedure: HYSTERECTOMY VAGINAL WITH MORCELLATION;  Surgeon: Chancy Milroy, MD;  Location: [facility];  Service: Gynecology;  Laterality: N/A;    Assessment & Plan Clinical Impression: Zanyah Lentsch is a 54 year old female with history of bipolar disorder, Vaginal cancer, frequent UTIs, chronic back pain, ETOH abuse, morbid obesity who was admitted on 04/09/20 after a slipping on puddle of water with fall at her boyfriend's facility and sustained  acute comminuted fracture of distal tibial diaphysis with 1.4 cm displacement and multiple comminuted tibial fracture fragments and acute fracture of fibula diaphysis with 2 cm lateral and 0.5 cm anterior displacement, mildly displaced medial malleolus fracture and nondisplaced talar fracture.  She underwent I & D with external fixation of tib/fib fractures by Dr. Griffin Basil. Dr. Doreatha Martin consulted due to complexity of fracture and patient taken to OR for ORIF right tibial shaft, I & D open right tibia fracture and closer treatment of right medial malleolus Fx on 06/21. Post op NWB on RLE and incisional wound VAC to stay in place till next Wed/Thurs. Therapy ongoing and patient continues to be limited by NWB status and requires visual/verbal/tactile cues to  maintain precautions, has anxiety and pain affecting functional status with difficulty following multi step commands. CIR recommended due to functional deficits.    Patient currently requires min with basic self-care skills secondary to muscle weakness and pain,  decreased cardiorespiratoy endurance and difficulty maintaining precautions.  Prior to hospitalization, patient could complete BADLs with independent .  Patient will benefit from skilled intervention to increase independence with basic self-care skills prior to discharge home alone.  Anticipate patient will require intermittent supervision and follow up home health.  OT - End of Session Endurance Deficit: Yes OT Assessment Rehab Potential (ACUTE ONLY): Good OT Barriers to Discharge: Decreased caregiver support;Weight;Weight bearing restrictions OT Patient demonstrates impairments in the following area(s): Balance;Safety;Skin Integrity;Endurance;Motor;Pain OT Basic ADL's Functional Problem(s): Grooming;Bathing;Dressing;Toileting OT Advanced ADL's Functional Problem(s): Simple Meal Preparation OT Transfers Functional Problem(s): Toilet;Tub/Shower OT Additional Impairment(s): None OT Plan OT Intensity: Minimum of 1-2 x/day, 45 to 90 minutes OT Frequency: 5 out of 7 days OT Duration/Estimated Length of Stay: 14-16 days OT Treatment/Interventions: Balance/vestibular training;Discharge planning;Pain management;Self Care/advanced ADL retraining;Therapeutic Activities;UE/LE Coordination activities;Therapeutic Exercise;Patient/family education;Disease mangement/prevention;Functional mobility training;Community reintegration;DME/adaptive equipment instruction;Psychosocial support;UE/LE Strength taining/ROM;Wheelchair propulsion/positioning OT Self Feeding Anticipated Outcome(s): No goal OT Basic Self-Care Anticipated Outcome(s): Mod I OT Toileting Anticipated Outcome(s): Mod I OT Bathroom Transfers Anticipated Outcome(s): Supervision/setup-Mod I OT Recommendation Recommendations for Other Services: Neuropsych consult;Therapeutic Recreation consult Therapeutic Recreation Interventions: Other (comment) (anxiety/stress mgt) Patient destination: Home Follow Up Recommendations: Home health OT Equipment  Recommended: To be determined   Skilled Therapeutic Intervention Skilled OT session completed with focus on initial evaluation, education on OT role/POC, and establishment of patient-centered goals.   Pt greeted in bed, premedicated for pain and agreeable to tx. After OT donned CAM boot, Min A for guiding Rt LE towards EOB very carefully due to pain. While sitting, pt engaged in UB bathing/dressing tasks, oral care and grooming tasks with setup assistance. She was able to wash her Lt foot unassisted and shave Lt LE with Min A. Min A for transition back to bed to complete LB self care, pt able to lift bottom using both UEs to scoot up towards [place]. Rolling Rt>Lt completed with supervision while OT completed perihygiene in the back and brief change. Pt was able to assist in the front with HOB elevated. At end of tx provided pt with aromatherapy peace blend per request, as we discussed using aromatherapy interventions to help with feelings of anxiety. Pt appreciative. Left her with all needs within reach and bed alarm set.   OT Evaluation Precautions/Restrictions  Precautions Precautions: Fall Restrictions Weight Bearing Restrictions: Yes RLE Weight Bearing: Non weight bearing Pain Pain Assessment Pain Scale: 0-10 Pain Score: 6  Pain Type: Acute pain;Surgical pain Pain Location: Leg Pain Orientation: Right Pain Descriptors / Indicators: Sore;Aching Pain Frequency: Intermittent Pain Onset: On-going Pain Intervention(s): Medication (See eMAR) (oxycodone) Home Living/Prior Functioning Home Living Family/patient expects to be discharged to:: Private residence Living Arrangements: Alone Available Help at Discharge: Neighbor Verdene Lennert) Type of Home: Apartment Home Access: Level entry Home Layout: One level Bathroom Accessibility: Yes  Lives With: Alone IADL History Homemaking Responsibilities: Yes (pt reports she took care of all IADL responsibilities PTA as she lived alone, describes  herself as a "neat freak") Occupation: On disability Leisure and Hobbies: coloring Prior Function Level of Independence: Independent with basic ADLs, Independent with homemaking with ambulation Driving: No ADL ADL Eating: Not assessed Grooming: Setup Where Assessed-Grooming: Edge of bed Upper Body Bathing: Setup Where Assessed-Upper Body Bathing: Edge of bed  Lower Body Bathing: Minimal assistance Where Assessed-Lower Body Bathing: Edge of bed, Bed level Upper Body Dressing: Minimal assistance Where Assessed-Upper Body Dressing: Edge of bed Lower Body Dressing: Maximal assistance Where Assessed-Lower Body Dressing: Bed level Toileting: Not assessed Toilet Transfer: Not assessed Tub/Shower Transfer: Not assessed Vision Baseline Vision/History: Wears glasses Wears Glasses: At all times Patient Visual Report: No change from baseline Perception  Perception: Within Functional Limits Praxis Praxis: Intact Cognition Overall Cognitive Status: Within Functional Limits for tasks assessed Arousal/Alertness: Awake/alert Orientation Level: Person;Place;Situation Place: Oriented Situation: Oriented Year: 2021 Month: June Day of Week: Correct Immediate Memory Recall: Sock;Blue;Bed Memory Recall Sock: Without Cue Memory Recall Blue: Without Cue Memory Recall Bed: With Cue Behaviors: Other (comment) (anxious) Sensation Coordination Gross Motor Movements are Fluid and Coordinated: No Fine Motor Movements are Fluid and Coordinated: Yes Coordination and Movement Description: Affected by pain, body habitus, and functional limitations due to WB status Motor  Motor Motor: Other (comment) Motor - Skilled Clinical Observations: generalized weakness and decreased endurance Mobility    No OOB transfer attempted  Trunk/Postural Assessment  Cervical Assessment Cervical Assessment: Exceptions to [facility] (forward head) Thoracic Assessment Thoracic Assessment: Exceptions to [facility] (rounded  shoulders) Lumbar Assessment Lumbar Assessment: Exceptions to [facility] (posterior pelvic tilt) Postural Control Postural Control: Within Functional Limits  Balance Balance Balance Assessed: Yes Dynamic Sitting Balance Dynamic Sitting - Level of Assistance: 5: Stand by assistance (shaving Lt LE) Extremity/Trunk Assessment RUE Assessment RUE Assessment: Within Functional Limits LUE Assessment LUE Assessment: Within Functional Limits   Refer to Care Plan for Long Term Goals  Recommendations for other services: Neuropsych and Therapeutic Recreation  Other anxiety/stress mgt   Discharge Criteria: Patient will be discharged from OT if patient refuses treatment 3 consecutive times without medical reason, if treatment goals not met, if there is a change in medical status, if patient makes no progress towards goals or if patient is discharged from hospital.  The above assessment, treatment plan, treatment alternatives and goals were discussed and mutually agreed upon: by patient  Skeet Simmer 04/16/2020, 12:36 PM

## 2020-04-16 NOTE — Evaluation (Signed)
Physical Therapy Assessment and Plan  Patient Details  Name: Jordan [name] MRN: [id number] Date of Birth: [birth year]  PT Diagnosis: Abnormal posture, Abnormality of gait, Difficulty walking, Low back pain, Muscle weakness and Pain in R LE Rehab Potential: Good ELOS: 14-18 days   Today's Date: 04/16/2020 PT Individual Time: 0800-0900 and 1300-1400 PT Individual Time Calculation (min): 60 min and 60 min  Hospital Problem: Principal Problem:   Tibia/fibula fracture, left, closed, initial encounter Active Problems:   Bipolar disorder [facility])   Past Medical History:  Past Medical History:  Diagnosis Date  . Alcohol abuse   . Anemia    patient denies  . Bipolar 1 disorder (HCC)    No medications currently  . CAP (community acquired pneumonia) 03/17/2015  . 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 Mental disorder   . Obesity   . PICC line infection 05/17/2015  . Sepsis due to Gram negative bacteria (MDR E Coli) 02/18/2015  . 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 UTI (lower urinary tract infection)   . Vaginal Pap smear, abnormal    Past Surgical History:  Past Surgical History:  Procedure Laterality Date  . ABDOMINAL HYSTERECTOMY    . CERVICAL CONIZATION W/BX N/A 07/01/2016   Procedure: CONIZATION CERVIX WITH BIOPSY;  Surgeon: Chancy Milroy, MD;  Location: [facility];  Service: Gynecology;  Laterality: N/A;  . CHOLECYSTECTOMY    . EXTERNAL FIXATION LEG Right 04/09/2020   Procedure: EXTERNAL FIXATION LEG;  Surgeon: Hiram Gash, MD;  Location: [facility];  Service: Orthopedics;  Laterality: Right;  . HYSTEROSCOPY WITH D & C N/A 01/25/2016   Procedure: DILATATION AND CURETTAGE /HYSTEROSCOPY;  Surgeon: Mora Bellman, MD;  Location: [facility];  Service: Gynecology;  Laterality: N/A;  . LYMPH NODE BIOPSY Bilateral 07/22/2019   Procedure: LYMPH NODE BIOPSY;  Surgeon: Everitt Amber, MD;  Location: [facility];  Service: Gynecology;  Laterality: Bilateral;  . OPEN REDUCTION INTERNAL  FIXATION (ORIF) TIBIA/FIBULA FRACTURE Right 04/10/2020   Procedure: OPEN REDUCTION INTERNAL FIXATION (ORIF) TIBIA/FIBULA FRACTURE;  Surgeon: Shona Needles, MD;  Location: [facility];  Service: Orthopedics;  Laterality: Right;  . ROBOT ASSISTED MYOMECTOMY N/A 07/22/2019   Procedure: XI ROBOTIC ASSISTED LAPAROSCOPIC RADICAL UPPER VAGINECTOMY, LEFT SALPINECTOMY, RIGHT SALPINGOOOPHERECTOMY;  Surgeon: Everitt Amber, MD;  Location: [facility];  Service: Gynecology;  Laterality: N/A;  . VAGINAL HYSTERECTOMY N/A 03/11/2017   Procedure: HYSTERECTOMY VAGINAL WITH MORCELLATION;  Surgeon: Chancy Milroy, MD;  Location: [facility];  Service: Gynecology;  Laterality: N/A;    Assessment & Plan Clinical Impression: Patient is a 54 y.o. year old female with history of bipolar disorder, Vaginal cancer, frequent UTIs, chronic back pain, ETOH abuse, morbid obesity who was admitted on 04/09/20 after a slipping on puddle of water with fall at her boyfriend's facility and sustained  acute comminuted fracture of distal tibial diaphysis with 1.4 cm displacement and multiple comminuted tibial fracture fragments and acute fracture of fibula diaphysis with 2 cm lateral and 0.5 cm anterior displacement, mildly displaced medial malleolus fracture and nondisplaced talar fracture.  She underwent I & D with external fixation of tib/fib fractures by Dr. Griffin Basil. Dr. Doreatha Martin consulted due to complexity of fracture and patient taken to OR for ORIF right tibial shaft, I & D open right tibia fracture and closer treatment of right medial malleolus Fx on 06/21. Post op NWB on RLE and incisional wound VAC to stay in place till next Wed/Thurs. Therapy ongoing and patient continues  to be limited by NWB status and requires visual/verbal/tactile cues to maintain precautions, has anxiety and pain affecting functional status with difficulty following multi step commands. CIR recommended due to functional deficits.  Patient transferred to CIR on 04/15/2020 .   Patient  currently requires min +2 with mobility secondary to muscle weakness and muscle joint tightness, decreased cardiorespiratoy endurance, decreased problem solving and decreased safety awareness and decreased sitting balance, decreased standing balance, decreased postural control, decreased balance strategies and difficulty maintaining precautions.  Prior to hospitalization, patient was independent  with mobility and lived with Alone in a [place] home.  Home access is  Level entry (reports a "hump" to enter the back door).  Patient will benefit from skilled PT intervention to maximize safe functional mobility, minimize fall risk and decrease caregiver burden for planned discharge home with intermittent assist.  Anticipate patient will benefit from follow up [facility] at discharge.  PT - End of Session Activity Tolerance: Tolerates 30+ min activity with multiple rests Endurance Deficit: Yes PT Assessment Rehab Potential (ACUTE/IP ONLY): Good PT Barriers to Discharge: Home environment access/layout;Weight;Lack of/limited family support;Decreased caregiver support;Incontinence;Weight bearing restrictions;Behavior PT Barriers to Discharge Comments: Patient lives alone with minimal intermittent assistance from a neighbor available at d/c, patient will require bariatric equipment due to body habitus, need to determine if her home will accomidate this, R LE NWB, patient with poor compliance with weight bearing precautions, significant anxiety with mobility PT Patient demonstrates impairments in the following area(s): Balance;Behavior;Edema;Endurance;Motor;Nutrition;Pain;Safety;Sensory;Skin Integrity PT Transfers Functional Problem(s): Bed to Chair;Car;Furniture;Bed Mobility PT Locomotion Functional Problem(s): Ambulation;Wheelchair Mobility;Stairs PT Plan PT Intensity: Minimum of 1-2 x/day ,45 to 90 minutes PT Frequency: 5 out of 7 days PT Duration Estimated Length of Stay: 14-18 days PT Treatment/Interventions:  Ambulation/gait training;Community reintegration;DME/adaptive equipment instruction;Neuromuscular re-education;Psychosocial support;Stair training;UE/LE Strength taining/ROM;Wheelchair propulsion/positioning;Balance/vestibular training;Discharge planning;Functional electrical stimulation;Pain management;Skin care/wound management;Therapeutic Activities;UE/LE Coordination activities;Disease management/prevention;Functional mobility training;Patient/family education;Splinting/orthotics;Therapeutic Exercise PT Transfers Anticipated Outcome(s): mod I using LRAD PT Locomotion Anticipated Outcome(s): mod I using LRAD PT Recommendation Recommendations for Other Services: Neuropsych consult;Therapeutic Recreation consult Therapeutic Recreation Interventions: Stress management Follow Up Recommendations: Home health PT Patient destination: Home Equipment Details: patient has no equipment at this time, will determine DME need based on patient's progress  Skilled Therapeutic Intervention In addition to the PT evaluation below, the patient performed the following skilled PT interventions:  Session 1: Patient in bed with R CAM boot donned upon PT arrival. Patient alert and agreeable to PT session. Patient reported 10/10 R LE and hipflexor pain during session, RN made aware. PT provided repositioning, rest breaks, and distraction as pain interventions throughout session. Obtained 24"x18" w/c and bariatric RW for mobility during session.   Therapeutic Activity: Bed Mobility: Patient performed rolling R/L with supervision and supine to/from sit with min A for R LE management with use of bed rails and with HOB slightly elevated due to back pain and patient request. Patient unwilling to attempt mobility without use of bed rails due to fear of pain with movement. Removed purewick and donned brief with total A during bed mobility.   Transfers: Patient performed sit to/from stand x2 with min A using a bariatric RW.  Provided heavy cues and demonstration for technique to maintain R LE NWB. Patient unable to follow cues on both trials and placing weight through R LE. PT instructed patient to stop prior to coming to full stand both trials due to non adhearance to weight bearing precautions making transfers unsafe.  Attempted lateral scoot transfer from EOB>w/c.  Demonstrated technique for patient with R LE NWB. Patient unable to initiate transfer due to significant anxiety and request for a second person to assist due to fear of falling. When a second person was unable to join, patient declined attempting transfer at this time.   Patient in bed at end of session with breaks locked, bed alarm set, and all needs within reach. Patient required significant time for all mobility due to R LE pain, anxiety, and poor compliance to weight bearing precautions.  Session 2: Patient in bed upon PT arrival. Patient alert and agreeable to PT session. Patient reported 9/10 R LE pain during session, RN made aware. PT provided repositioning, rest breaks, and distraction as pain interventions throughout session.   Therapeutic Activity: Bed Mobility: Patient performed supine to/from sit with min A as above. Provided verbal cues for sequencing for lifting R LE and decreased use of bed rails to simulate home environment. Patient progressed to Axis for R LE management with cues.  Transfers: Patient performed slide board transfers bed<>w/c with min A +2 and total A for board placement. Provided cues for hand placement, board placement, R LE NWB and positioning, and head-hips relationship for proper technique and decreased assist with transfers. Patient required significant time and encouragement throughout due to anxiety with mobility and to ensure R LE NWB. Placed a 2" step under R LE during second transfer with improved adherence to precautions initially, then patient became very frustrated with step and stated that the positioning hurt her R  foot during the transfer. Will continue to problem solve strategies to maintain precautions during transfers.  Between transfers patient was transported total A to the gym for PT to demonstrate slide board transfers while maintaining weight bearing precautions. Allowing patient to ask questions throughout.   Wheelchair Mobility:  Patient propelled wheelchair 15 feet with supervision using B UEs. Distance limited by fatigue and low frustration tolerance with a novel task. Noted that the sides of the w/c were pressing in on the patient's hips, will obtain a larger w/c for improved fit during next session.   Patient in bed at end of session with breaks locked, bed alarm set, and all needs within reach.   Instructed pt in results of PT evaluation as detailed below, PT POC, rehab potential, rehab goals, and discharge recommendations. Additionally discussed CIR's policies regarding fall safety and use of chair alarm and/or quick release belt. Pt verbalized understanding and in agreement. Will update pt's family members as they become available.    PT Evaluation Precautions/Restrictions Precautions Precautions: Fall Precaution Comments: has anxiety with mobility Required Braces or Orthoses: Other Brace Other Brace: CAM boot to R LE Restrictions Weight Bearing Restrictions: Yes RLE Weight Bearing: Non weight bearing Home Living/Prior Functioning Home Living Available Help at Discharge: Neighbor;Available PRN/intermittently Verdene Lennert, intermittent assist) Type of Home: Apartment Home Access: Level entry (reports a "hump" to enter the back door) Home Layout: One level Bathroom Accessibility: Yes Additional Comments: Reports the apartment is w/c accessable, will need measurements due to increased w/c width for bariatric chair  Lives With: Alone Prior Function Level of Independence: Independent with basic ADLs;Independent with homemaking with ambulation;Independent with gait Driving:  No Vision/Perception  Perception Perception: Within Functional Limits Praxis Praxis: Intact  Cognition Overall Cognitive Status: Impaired/Different from baseline (due to anxiety with mobility and poor adhearance to precautions) Arousal/Alertness: Awake/alert Memory: Appears intact Immediate Memory Recall: Sock;Blue;Bed Memory Recall Sock: Without Cue Memory Recall Blue: Without Cue Memory Recall Bed: With Cue Awareness: Appears intact  Problem Solving: Impaired (limited by anxiety with mobility) Problem Solving Impairment: Functional basic Behaviors: Other (comment) (anxious) Safety/Judgment: Impaired Comments: unable to maintain R LE NWB despite heavy cues and demonstration througout evaluation Sensation Sensation Light Touch: Appears Intact Proprioception: Appears Intact Coordination Gross Motor Movements are Fluid and Coordinated: No Fine Motor Movements are Fluid and Coordinated: Yes Coordination and Movement Description: Affected by pain, body habitus, and functional limitations due to NWB status on R LE Motor  Motor Motor: Other (comment) Motor - Skilled Clinical Observations: generalized weakness, decreased endurance, R LE NWB  Mobility Bed Mobility Bed Mobility: Rolling Right;Rolling Left;Supine to Sit;Sit to Supine Rolling Right: Supervision/verbal cueing Rolling Left: Supervision/Verbal cueing Supine to Sit: Minimal Assistance - Patient > 75% Sit to Supine: Minimal Assistance - Patient > 75% Transfers Transfers: Sit to Stand;Stand to Sit Sit to Stand: Minimal Assistance - Patient > 75% (unable to maintain R LE NWB) Stand to Sit: Minimal Assistance - Patient > 75% (unable to maintain R LE NWB) Transfer (Assistive device): Rolling walker Locomotion  Gait Ambulation: No (unable to maintain R LE NWB in standing) Gait Gait: No Stairs / Additional Locomotion Stairs: No Wheelchair Mobility Wheelchair Mobility: Yes Wheelchair Assistance: Dentist: Both upper extremities Wheelchair Parts Management: Needs assistance Distance: 15 ft limited by decreased activity tolerance  Trunk/Postural Assessment  Cervical Assessment Cervical Assessment: Exceptions to [facility] (forward head) Thoracic Assessment Thoracic Assessment: Exceptions to [facility] (rounded shoulders) Lumbar Assessment Lumbar Assessment: Exceptions to [facility] (posterior pelvic tilt) Postural Control Postural Control: Deficits on evaluation (decreased by R LE NWB)  Balance Balance Balance Assessed: Yes Static Sitting Balance Static Sitting - Balance Support: No upper extremity supported;Feet unsupported Static Sitting - Level of Assistance: 7: Independent Dynamic Sitting Balance Dynamic Sitting - Balance Support: No upper extremity supported;Feet supported Dynamic Sitting - Level of Assistance: 5: Stand by assistance Dynamic Sitting - Balance Activities: Lateral lean/weight shifting;Forward lean/weight shifting;Reaching for objects Static Standing Balance Static Standing - Balance Support: Bilateral upper extremity supported;During functional activity Static Standing - Level of Assistance: 4: Min assist Static Standing - Comment/# of Minutes: <5 sec due to non-compliance with weight bearing precautions Extremity Assessment  RUE Assessment RUE Assessment: Within Functional Limits LUE Assessment LUE Assessment: Within Functional Limits RLE Assessment RLE Assessment: Exceptions to [facility] Active Range of Motion (AROM) Comments: hip flexion limited with pain at hip flexor, knee ROM WFL General Strength Comments: Grossly 2+/5 throughout limited by pain LLE Assessment LLE Assessment: Within Functional Limits Active Range of Motion (AROM) Comments: WFL General Strength Comments: Grossly in sititng 5/5 throughout    Refer to Care Plan for Long Term Goals  Recommendations for other services: Neuropsych and Therapeutic Recreation  Stress  management  Discharge Criteria: Patient will be discharged from PT if patient refuses treatment 3 consecutive times without medical reason, if treatment goals not met, if there is a change in medical status, if patient makes no progress towards goals or if patient is discharged from hospital.  The above assessment, treatment plan, treatment alternatives and goals were discussed and mutually agreed upon: by patient  Doreene Burke PT, DPT  04/16/2020, 3:51 PM

## 2020-04-16 NOTE — Progress Notes (Signed)
[place] PHYSICAL MEDICINE & REHABILITATION PROGRESS NOTE   Subjective/Complaints:  Pt denies constipation and feeling bad, even though has been 8 days since BM- says this is normal at home- refusing all bowel meds- said she did therapy this AM, but admits put pressure on RLE when is NWB.  Likes kpad.  Wants to have a BM "on own" without food/meds that would assist.    ROS:  Pt denies SOB, abd pain, CP, N/V/C/D, and vision changes   Objective:   No results found. Recent Labs    04/16/20 0522  WBC 5.0  HGB 10.3*  HCT 31.0*  PLT 191   Recent Labs    04/16/20 0522  NA 135  K 4.6  CL 100  CO2 27  GLUCOSE 120*  BUN 10  CREATININE 0.61  CALCIUM 8.5*    Intake/Output Summary (Last 24 hours) at 04/16/2020 1354 Last data filed at 04/16/2020 0754 Gross per 24 hour  Intake 720 ml  Output 1400 ml  Net -680 ml     Physical Exam: Vital Signs Blood pressure 95/61, pulse 96, temperature 98.1 F (36.7 C), temperature source Oral, resp. rate 18, height 5\' 8"  (1.727 m), weight (!) 147.6 kg, SpO2 95 %.  Constitutional:   Pt sitting up in bed; anxious, not appropriate, NAD    HENT: conjugate gaze- wearing eyeglasses.  Cardiovascular: borderline tachycardia- regular rhythm Pulmonary: CTA B/L- no W/R/R- good air movement Abdominal: pt is distended/protuberant- refuses palpation- hypoactive Genitourinary:    Comments: purewick in place- asked for it to be used only at night-  Musculoskeletal:     Cervical back: Normal range of motion and neck supple. No rigidity.     Comments: UEs B/L 5/5 in deltoid, biceps, triceps, grip and finger abd B/L LLE- 5/5 in HF, KE, KF, DF and PF RLE- wouldn't let me test- but HF appears 2 to 2-/5, KE/KF 3-/5, and unable to test DF/PF due to CAM boot  Skin:Bruising from prior IVs in arms Dressing on RLE- with  ACE wrap over kerlex- was able ot look at pic on phone of it dated yesterday- incisions intact- no drainage, no erythema except localized  erythema around incisions  Neurological:     Mental Status: She is alert.     Comments: Warm, and can wiggle toes on RLE Sensation intact to light touch in all 4 extremities Psychiatric: emotionally anxious/labile   Assessment/Plan: 1. Functional deficits secondary to RLE tib/fib fx s/p ex-fix and ORIF_ NWB on RLE which require 3+ hours per day of interdisciplinary therapy in a comprehensive inpatient rehab setting.  Physiatrist is providing close team supervision and 24 hour management of active medical problems listed below.  Physiatrist and rehab team continue to assess barriers to discharge/monitor patient progress toward functional and medical goals  Care Tool:  Bathing  Bathing activity did not occur:  (N/A) Body parts bathed by patient: Right arm, Left arm, Chest, Abdomen, Right upper leg, Left upper leg, Face, Front perineal area, Left lower leg   Body parts bathed by helper: Buttocks Body parts n/a: Right lower leg   Bathing assist Assist Level: Minimal Assistance - Patient > 75%     Upper Body Dressing/Undressing Upper body dressing Upper body dressing/undressing activity did not occur (including orthotics): N/A What is the patient wearing?: Hospital gown only    Upper body assist Assist Level: Minimal Assistance - Patient > 75%    Lower Body Dressing/Undressing Lower body dressing    Lower body dressing activity did  not occur: N/A What is the patient wearing?: Incontinence brief     Lower body assist Assist for lower body dressing: Total Assistance - Patient < 25%     Toileting Toileting Toileting Activity did not occur (Clothing management and hygiene only): N/A (no void or bm)  Toileting assist Assist for toileting: Total Assistance - Patient < 25%     Transfers Chair/bed transfer  Transfers assist  Chair/bed transfer activity did not occur: N/A        Locomotion Ambulation   Ambulation assist              Walk 10 feet  activity   Assist           Walk 50 feet activity   Assist           Walk 150 feet activity   Assist           Walk 10 feet on uneven surface  activity   Assist           Wheelchair     Assist               Wheelchair 50 feet with 2 turns activity    Assist            Wheelchair 150 feet activity     Assist          Blood pressure 95/61, pulse 96, temperature 98.1 F (36.7 C), temperature source Oral, resp. rate 18, height 5\' 8"  (1.727 m), weight (!) 147.6 kg, SpO2 95 %.  Medical Problem List and Plan: 1.  Impaired function secondary to RLE tib/fib fx- is NWB on RLE             -patient may  Shower if RLE is covered             -ELOS/Goals: 14-18 days- mod I to supervision 2.  Antithrombotics: -DVT/anticoagulation:  Pharmaceutical: Lovenox             -antiplatelet therapy: N/A 3. Pain Management: Was on oxycodone 10 mg--reports was taking 1-2 x day prn. Continue gabapentin tid, Naprosyn bid. Requires oxycodone every 4 hours prn.  Pain control remains an issues--question add OxyContin v/s po dilaudid. Will need to premedicate prior to therapy sessions.  -was extremely clear with pt to look at schedule and take meds before therapy- explained insurance will not pay for CIR if she doesn't do therapy- was already trying to say she wasn't sure about doing 3 hours/day; will add Kpad and ice prn  6/27- says kpad and meds working 4. Mood: LCSW to follow for evaluation and support.              -antipsychotic agents: On Latuda 5. Neuropsych: This patient is capable of making decisions on her own behalf. 6. Skin/Wound Care: Routine pressure relief measures.  -needs dressing changes daily and prn 7. Fluids/Electrolytes/Nutrition: Monitor I/O. Check lytes in am and on Wed 8. Bipolar disorder: On Trileptal and Latuda.  9. H/o Nocturia/frequent UTIs: On ditropan. -uses depends at home- will allow purewick at night, at least initiually per  pt specific request- cannot do during day  10. Constipation: Has not had BM--does not want any laxatives  "wants to do on her own" -no BM in 7+ days- but since this is "normal" refuses all bowel meds   6/27- no BM in 8+ days- "wants to do on own"  Spent 25 minutes on total care of pt today- 10 minutes on note;  15 minutes in discussion with pt, trying to compromise on bowels- concerned she could get bowel obstruction, but she isn't.  LOS: 1 days A FACE TO FACE EVALUATION WAS PERFORMED  Savon Bordonaro 04/16/2020, 1:54 PM

## 2020-04-16 NOTE — Progress Notes (Signed)
LBM 06/22. Refused scheduled colace. Explained rationale and need to have BM and oxy IR can cause constipation. Patient reports, "My body will have a BM when it's ready." CAM boot in place to RLE." PRN oxy IR 10mg 's given at 2016 and 0531, rates pain between 8-10. Purewick in place, output=1350cc's. Jordan [name] A

## 2020-04-17 ENCOUNTER — Inpatient Hospital Stay (HOSPITAL_COMMUNITY): Payer: Medicaid Other | Admitting: Occupational Therapy

## 2020-04-17 ENCOUNTER — Inpatient Hospital Stay (HOSPITAL_COMMUNITY): Payer: Medicaid Other

## 2020-04-17 DIAGNOSIS — G8918 Other acute postprocedural pain: Secondary | ICD-10-CM

## 2020-04-17 DIAGNOSIS — F319 Bipolar disorder, unspecified: Secondary | ICD-10-CM

## 2020-04-17 DIAGNOSIS — S82201S Unspecified fracture of shaft of right tibia, sequela: Secondary | ICD-10-CM

## 2020-04-17 DIAGNOSIS — S82401S Unspecified fracture of shaft of right fibula, sequela: Secondary | ICD-10-CM

## 2020-04-17 NOTE — Progress Notes (Signed)
Inpatient Rehabilitation Medication Review by a Pharmacist  A complete drug regimen review was completed for this patient to identify any potential clinically significant medication issues.  Clinically significant medication issues were identified:  no   Pharmacist comments:   Time spent performing this drug regimen review (minutes):  53min   Bertis Ruddy 04/17/2020 8:10 AM

## 2020-04-17 NOTE — Care Management (Signed)
Inpatient Rehabilitation Center Individual Statement of Services  Patient Name:  Jordan [name]  Date:  04/17/2020  Welcome to the [facility].  Our goal is to provide you with an individualized program based on your diagnosis and situation, designed to meet your specific needs.  With this comprehensive rehabilitation program, you will be expected to participate in at least 3 hours of rehabilitation therapies Monday-Friday, with modified therapy programming on the weekends.  Your rehabilitation program will include the following services:  Physical Therapy (PT), Occupational Therapy (OT), 24 hour per day rehabilitation nursing, Therapeutic Recreaction (TR), Psychology, Neuropsychology, Care Coordinator, Rehabilitation Medicine, Nutrition Services, Pharmacy Services and Other  Weekly team conferences will be held on Tuesdays to discuss your progress.  Your Inpatient Rehabilitation Care Coordinator will talk with you frequently to get your input and to update you on team discussions.  Team conferences with you and your family in attendance may also be held.  Expected length of stay: 14-18 days  Overall anticipated outcome: Independent with Assistive Device  Depending on your progress and recovery, your program may change. Your Inpatient Rehabilitation Care Coordinator will coordinate services and will keep you informed of any changes. Your Inpatient Rehabilitation Care Coordinator's name and contact numbers are listed  below.  The following services may also be recommended but are not provided by the [facility] will be made to provide these services after discharge if needed.  Arrangements include referral to agencies that provide these services.  Your insurance has been verified to be:  Medicaid  Your primary  doctor is:  [name]  Pertinent information will be shared with your doctor and your insurance company.  Inpatient Rehabilitation Care Coordinator:  Cathleen Corti [phone] or [phone]  Information discussed with and copy given to patient by: Rana Snare, 04/17/2020, 1:21 PM

## 2020-04-17 NOTE — Progress Notes (Signed)
Per nurse tech, pt called for bathroom. NT and another Nurse went in to preform a safe 2 person transfer to [facility]. Pt refused to transfer by scoot onto [facility] and stated she could hold it. When NT went back in to check on pt around 1445, pt stated she had urinary incontinence and BM with therapy at 1430.

## 2020-04-17 NOTE — Progress Notes (Signed)
Occupational Therapy Session Note  Patient Details  Name: Jordan [name] MRN: [id number] Date of Birth: Jul 25, 1966  Today's Date: 04/17/2020 OT Individual Time: 1430-1500 OT Individual Time Calculation (min): 30 min    Short Term Goals: Week 1:  OT Short Term Goal 1 (Week 1): Pt will complete a BSC or toilet transfer with 1 assist using LRAD while adhering to WB precautions OT Short Term Goal 2 (Week 1): Pt will complete 1 grooming task while sitting at the sink to increase OOB tolerance OT Short Term Goal 3 (Week 1): Pt will engage in 1 simulated IADL task w/c level with supervision assistance  Skilled Therapeutic Interventions/Progress Updates:    Pt received sitting in recliner with c/o pain 10/10 in her back and R LE- requesting return to bed for intervention. . Pt completed anterior/posterior scoot from recliner to the bed with assistance of one to hold RLE and OT providing mod A to scoot with use of chuck pad. Pt requested to use bedpan to pee, declining BSC transfer. Pt very anxious throughout session, room dimmed to reduce sensory input and cueing for deep breathing provided. Pt rolled R and L for bedpan placement with min A. Pt able to complete anterior hygiene with set up assist while supine. Pt then reported need to have a BM- stating she was incontinent. Pt had slight smear on washcloth, requiring total A for hygiene posteriorly. Pt was left supine with clean brief donned and clean bedsheets. Bed alarm set.   Therapy Documentation Precautions:  Precautions Precautions: Fall Precaution Comments: has anxiety with mobility Required Braces or Orthoses: Other Brace Other Brace: CAM boot to R LE Restrictions Weight Bearing Restrictions: Yes RLE Weight Bearing: Non weight bearing   Therapy/Group: Individual Therapy  Curtis Sites 04/17/2020, 6:48 AM

## 2020-04-17 NOTE — Progress Notes (Signed)
PVR this AM 77cc's.Patrici Ranks [name]

## 2020-04-17 NOTE — Progress Notes (Signed)
Physical Therapy Session Note  Patient Details  Name: Jordan [name] MRN: [id number] Date of Birth: [birth year]  Today's Date: 04/17/2020 PT Individual Time: 1005-1105 PT Individual Time Calculation (min): 60 min   Short Term Goals: Week 1:  PT Short Term Goal 1 (Week 1): Patient will perform bed mobility with supervision without use of bed rails. PT Short Term Goal 2 (Week 1): Patient will perform basic transfers using LRAD with min A while maintaining R LE NWB precautions. PT Short Term Goal 3 (Week 1): Patient will propel the w/c >50 ft with supervision.  Skilled Therapeutic Interventions/Progress Updates:     Patient in bed upon PT arrival. Patient alert and agreeable to PT session. Patient reported 10/10 R LE pain during session, RN made aware. PT provided repositioning, rest breaks, and distraction as pain interventions throughout session. Donned CAM boot on R LE with total A providing demonstration for patient throughout. Noted that CAM boot does not properly fit patient, dicussed with PA and MD, waiting for improved edema control before changing CAM boot.  Obtained alternate w/c's for patient, one only 1" wider and one 6-7" wider than the 24x18 that was in her room. Measured patient's hip width, 26" trochanter to trochanter. Neither chair would fit patient appropriately or promote functional w/c mobility. Discussed ordering a bariatric chair for patient with nursing secretary for improved w/c fit for mobility.   Therapeutic Activity: Bed Mobility: Patient performed supine to/from sit with min A for R LE management with heavy use of bed rails despite cues for decreased use of bed rails to simulate home set up. Provided verbal cues for slow, controlled movements to protect R LE and manage pain with mobility, patient tends to use momentum for movement due to body habitus. Patient sat EOB with distant supervision >10 min during session. Maintained R LE propped on heel of CAM boot only for NWB in  sitting. Patient performed scooting to the L x2 feet with min A and heavy cues for R foot placement to maintain NWB. Transfers: Patient attempted sit to/from stand x4 from an elevated bed using a bariatric RW with +2 assist. PT placed a foot behind patients R heel to promote knee extension and NWB on R LE with transfer. Patient unable to lift hips off the bed  During trials due to decreased L LE strength and patient with poor frustration tolerance and anxiety throughout. PT provided simple once step cues throughout and cued patient to take rest breaks and use diaphragmatic breathing between trials to manage anxiety and frustration.   Patient in bed with CAM boot doffed and R LE elevated at end of session with breaks locked, bed alarm set, and all needs within reach.    Therapy Documentation Precautions:  Precautions Precautions: Fall Precaution Comments: has anxiety with mobility Required Braces or Orthoses: Other Brace Other Brace: CAM boot to R LE Restrictions Weight Bearing Restrictions: Yes RLE Weight Bearing: Non weight bearing    Therapy/Group: Individual Therapy  Ulysee Fyock L Tibor Lemmons PT, DPT  04/17/2020, 12:35 PM

## 2020-04-17 NOTE — Progress Notes (Signed)
Inpatient Rehabilitation  Patient information reviewed and entered into eRehab system by Talin Feister M. Ettel Albergo, M.A., CCC/SLP, PPS Coordinator.  Information including medical coding, functional ability and quality indicators will be reviewed and updated through discharge.    

## 2020-04-17 NOTE — Progress Notes (Signed)
[facility] PHYSICAL MEDICINE & REHABILITATION PROGRESS NOTE   Subjective/Complaints:  Still having pain RLE. A lot of anxiety about falling again/flashbacks which has impacted her when she tries to move with therapy. C/o edema still RLE which affects cam walker fit  ROS: Patient denies fever, rash, sore throat, blurred vision, nausea, vomiting, diarrhea, cough, shortness of breath or chest pain   Objective:   No results found. Recent Labs    04/16/20 0522  WBC 5.0  HGB 10.3*  HCT 31.0*  PLT 191   Recent Labs    04/16/20 0522  NA 135  K 4.6  CL 100  CO2 27  GLUCOSE 120*  BUN 10  CREATININE 0.61  CALCIUM 8.5*    Intake/Output Summary (Last 24 hours) at 04/17/2020 1034 Last data filed at 04/17/2020 0600 Gross per 24 hour  Intake 942 ml  Output 200 ml  Net 742 ml     Physical Exam: Vital Signs Blood pressure 105/61, pulse 96, temperature 98.1 F (36.7 C), resp. rate 18, height 5\' 8"  (1.727 m), weight (!) 147.6 kg, SpO2 98 %.  Constitutional: No distress . Vital signs reviewed. HEENT: EOMI, oral membranes moist Neck: supple Cardiovascular: RRR without murmur. No JVD    Respiratory/Chest: CTA Bilaterally without wheezes or rales. Normal effort    GI/Abdomen: BS +, non-tender, non-distended Ext: no clubbing, cyanosis, or edema Psych: pleasant and cooperative  Musculoskeletal:     Cervical back: Normal range of motion and neck supple. No rigidity.   RLE: 2+ edema   Neuro:  Comments: UEs B/L 5/5 in deltoid, biceps, triceps, grip and finger abd B/L LLE- 5/5 in HF, KE, KF, DF and PF RLE-limited by pain. Is able to ADF/PF. Sensation intact Skin: numerous abrasions/incisions with suture in RLE   Psychiatric: emotionally anxious/labile, tangential   Assessment/Plan: 1. Functional deficits secondary to RLE tib/fib fx s/p ex-fix and ORIF_ NWB on RLE which require 3+ hours per day of interdisciplinary therapy in a comprehensive inpatient rehab setting.  Physiatrist  is providing close team supervision and 24 hour management of active medical problems listed below.  Physiatrist and rehab team continue to assess barriers to discharge/monitor patient progress toward functional and medical goals  Care Tool:  Bathing  Bathing activity did not occur:  (N/A) Body parts bathed by patient: Right arm, Left arm, Chest, Abdomen, Right upper leg, Left upper leg, Face, Front perineal area, Left lower leg   Body parts bathed by helper: Buttocks Body parts n/a: Right lower leg   Bathing assist Assist Level: Minimal Assistance - Patient > 75%     Upper Body Dressing/Undressing Upper body dressing Upper body dressing/undressing activity did not occur (including orthotics): N/A What is the patient wearing?: Pull over shirt    Upper body assist Assist Level: Set up assist    Lower Body Dressing/Undressing Lower body dressing    Lower body dressing activity did not occur: N/A What is the patient wearing?: Incontinence brief, Pants     Lower body assist Assist for lower body dressing: Maximal Assistance - Patient 25 - 49%     Toileting Toileting Toileting Activity did not occur (Clothing management and hygiene only): N/A (no void or bm)  Toileting assist Assist for toileting: Moderate Assistance - Patient 50 - 74%     Transfers Chair/bed transfer  Transfers assist  Chair/bed transfer activity did not occur: Safety/medical concerns (unable to maintain R LE NWB)        Locomotion Ambulation   Ambulation assist  Ambulation activity did not occur: Safety/medical concerns (unable to maintain R LE NWB in standing)          Walk 10 feet activity   Assist  Walk 10 feet activity did not occur: Safety/medical concerns        Walk 50 feet activity   Assist Walk 50 feet with 2 turns activity did not occur: Safety/medical concerns         Walk 150 feet activity   Assist Walk 150 feet activity did not occur: Safety/medical  concerns         Walk 10 feet on uneven surface  activity   Assist Walk 10 feet on uneven surfaces activity did not occur: Safety/medical concerns         Wheelchair     Assist Will patient use wheelchair at discharge?: Yes Type of Wheelchair: Manual    Wheelchair assist level: Supervision/Verbal cueing Max wheelchair distance: 15 ft    Wheelchair 50 feet with 2 turns activity    Assist    Wheelchair 50 feet with 2 turns activity did not occur: Safety/medical concerns (decreased activity tolerance)       Wheelchair 150 feet activity     Assist  Wheelchair 150 feet activity did not occur: Safety/medical concerns (decreased activity tolerance)       Blood pressure 105/61, pulse 96, temperature 98.1 F (36.7 C), resp. rate 18, height 5\' 8"  (1.727 m), weight (!) 147.6 kg, SpO2 98 %.  Medical Problem List and Plan: 1.  Impaired function secondary to RLE tib/fib fx after fall- is NWB on RLE             -patient may  Shower if RLE is covered             -ELOS/Goals: 14-18 days- mod I to supervision 2.  Antithrombotics: -DVT/anticoagulation:  Pharmaceutical: Lovenox             -antiplatelet therapy: N/A 3. Pain Management: prn oxycodone   -gabapentin 100mg  tid  -ice/heat, edmea control, reassurance 4. Mood: LCSW to follow for evaluation and support.              -antipsychotic agents: On Latuda  -will ask Dr. Sima Matas to see re: anxiety,? PTSD in setting of her baseline bipolar disorder   -continue trileptal and latuda per baseline 5. Neuropsych: This patient is capable of making decisions on her own behalf. 6. Skin/Wound Care: Routine pressure relief measures.    -changed to foam dressings to RLE  -ACE wrap and elevation for edema control.  7. Fluids/Electrolytes/Nutrition: I personally reviewed the patient's labs today.     9. H/o Nocturia/frequent UTIs: On ditropan.   -oob to void 10. Constipation: Has not had BM--does not want any laxatives   "wants to do on her own" -no BM in 7+ days- but since this is "normal" refuses all bowel meds   6/28 moved her bowels using "pickles". I told her that i'm ok with whatever she wants to use.   -will have miralax availabl prn     LOS: 2 days A FACE TO [place] 04/17/2020, 10:34 AM

## 2020-04-17 NOTE — Progress Notes (Signed)
This chaplain was present with the Pt. for F/U spiritual care per the Pt. request. The Pt. appropriately greeted the chaplain upon arrival. While practicing reflective listening the chaplain learned from the Pt., "therapy was challenging today but worth it to me." The chaplain observed the Pt. trying to focus her thoughts to handle the pain outside of what was being treated by pain medication. The chaplain will continue F/U spiritual care as needed.

## 2020-04-17 NOTE — Progress Notes (Signed)
Occupational Therapy Session Note  Patient Details  Name: Jordan [name] MRN: [id number] Date of Birth: 1966/07/18  Today's Date: 04/17/2020  Session 1 OT Individual Time: 4174-0814 OT Individual Time Calculation (min): 69 min   Session 2 OT Individual Time: 1115-1150 OT Individual Time Calculation (min): 35 min    Short Term Goals: Week 1:  OT Short Term Goal 1 (Week 1): Pt will complete a BSC or toilet transfer with 1 assist using LRAD while adhering to WB precautions OT Short Term Goal 2 (Week 1): Pt will complete 1 grooming task while sitting at the sink to increase OOB tolerance OT Short Term Goal 3 (Week 1): Pt will engage in 1 simulated IADL task w/c level with supervision assistance S Skilled Therapeutic Interventions/Progress Updates:  Session 1   Pt greeted semi-reclined in bed finishing breakfast. Pt seemed very anxious for therapy this morning. OT expressed the goals for this session including bathing/dressing tasks and explained role of OT. Pt getting anxious and worked up when explaining her home situation and that her neighbor will likely not actually help her at home. OT asked pt if we could dry to don clothes today, as it would make slideboard transfers easier to have pants on, pt reluctant, but agreeable. OT able to obtain XXXL paper scrubs for pt. MD entered room with pt having many questions for him. Discussed swelling of R LE with MD recommending removal of bulky dressing, application of foam bandages at incision sites, and then tight ace wrap for compression. OT completed this dressing change along with application of Ace wraps for compression. Pt yelling in pain when OT assists with lifting R LE for Ace wraps. Pt then completed bed level bathing and dressing. Pt was able to wash upper body with set-up A. She could reach peri-area, bring LLE up towards her, and even come into long sitting to help wash her feet. Pt then needed min A for rolling with OT assist to wash buttocks  thoroughly 2/2 body habitus. Pt able to come to long sitting again and help thread pants. She needed OT assist to get completely over R foot, but could then thread pants through LLE. Rolling to pull up pants with mod A, then pt reported feeling that pants were too tight and became anxious and started ripping them off. Pt came back into long sitting to don shirt with set-up A. OT elevated R LE with pillow and placed new ice pack for pain relief in preparation for upcoming PT session. Pt left semi-reclined in bed with alarm on, call bell in reach, and needs met.   Session 2 Pt greeted semi-reclined in bed and agreeable to OT treatment session. Tried to engage pt in [facility] transfer, but she became anxious and refused to try transfer. OT demonstrated AP transfer to pt. Pt was able to come into long sitting with supervision in earlier session, so OT felt AP transfer would be appropriate to try. OT demonstrated and reviewed AP transfer multiple times, step by step with pt to try to ease anxiety.Pt came to long sitting and began transfer. Pt yelling out multiple times due to pain, and needed OT assist to move R LE in the bed. Pt with difficulty initially offweighting hips for reciprocal scoot. Pt needed OT assist with chuck pad to help bring hips back into recliner chair. Pt left with LEs elevated on bed. Nurse tech informed of pt request to have sheets changed and educated on trying AP transfer back to bed. Pt stated  the AP transfer wasn't too bad and did not feel as anxious about it. Pt left with call bell in reach and needs met.  Therapy Documentation Precautions:  Precautions Precautions: Fall Precaution Comments: has anxiety with mobility Required Braces or Orthoses: Other Brace Other Brace: CAM boot to R LE Restrictions Weight Bearing Restrictions: Yes RLE Weight Bearing: Non weight bearing Pain:  no number given, pt stated high pain in her L leg and foot. OT repositioned pt for comfort.    Therapy/Group: Individual Therapy  Valma Cava 04/17/2020, 12:39 PM

## 2020-04-17 NOTE — Progress Notes (Signed)
PMR Admission Coordinator Pre-Admission Assessment  Patient: Jordan [name] is an 53 y.o., female MRN: [id number] DOB: 07/27/1966 Height: 5' 8" (172.7 cm) Weight: (!) 143.9 kg  Insurance Information HMO:     PPO:      PCP:      IPA:      80/20:      OTHER:  PRIMARY: Medicaid      Policy#: [id number]      Subscriber: pt CM Name:       Phone#:      Fax#:  Pre-Cert#: Coverage code [id number]      Employer: n/a Benefits:  Phone #:      Name:  Eff. Date: active as of 04/14/2020     Deduct:       Out of Pocket Max:       Life Max:  CIR:       SNF:  Outpatient:      Co-Pay:  Home Health:       Co-Pay:  DME:      Co-Pay:  Providers:  SECONDARY:       Policy#:      Phone#:   Financial Counselor:       Phone#:   The "Data Collection Information Summary" for patients in Inpatient Rehabilitation Facilities with attached "Privacy Act Statement-Health Care Records" was provided and verbally reviewed with: N/A  Emergency Contact Information Contact Information    Name Relation Home Work Mobile   Hall,Jeff Significant other [phone]  [phone]   Charon, [place] Daughter [phone]  [phone]   harding, jone Friend   [phone]      Current Medical History  Patient Admitting Diagnosis: R open ankle fx  History of Present Illness: Pt is a 53 y/o female with PMH of bipolar disorder, squamous cell carcinoma of vagina, and anemia admitted following a fall resulting in a right open tibia and fibula fracture.  She was initially seen at [facility] and transferred to [facility] for operative management on 04/09/20.  Prior to admission she ambulated without a device and lived independently.  Post op course complicated by ABLA.  Pt NWB on RLE.  Therapy evaluations were completed and pt was recommended for CIR.     Patient's medical record from [facility] has been reviewed by the rehabilitation admission coordinator and physician.  Past Medical History  Past Medical History:   Diagnosis Date  . Alcohol abuse   . Anemia    patient denies  . Bipolar 1 disorder (HCC)    No medications currently  . CAP (community acquired pneumonia) 03/17/2015  . 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 Mental disorder   . Obesity   . PICC line infection 05/17/2015  . Sepsis due to Gram negative bacteria (MDR E Coli) 02/18/2015  . 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HCC)   . UTI (lower urinary tract infection)   . Vaginal Pap smear, abnormal     Family History   family history includes Alcohol abuse in her father; Cancer in her father.  Prior Rehab/Hospitalizations Has the patient had prior rehab or hospitalizations prior to admission? Yes  Has the patient had major surgery during 100 days prior to admission? Yes   Current Medications  Current Facility-Administered Medications:  .  bisacodyl (DULCOLAX) EC tablet 5 mg, 5 mg, Oral, Daily PRN, Haddix, Kevin P, MD .  diphenhydrAMINE (BENADRYL) 12.5 MG/5ML elixir 12.5-25 mg, 12.5-25 mg, Oral, Q4H PRN, Haddix, Kevin P, MD .  docusate sodium (COLACE)   capsule 100 mg, 100 mg, Oral, BID, Haddix, Kevin P, MD, 100 mg at 04/14/20 0848 .  enoxaparin (LOVENOX) injection 40 mg, 40 mg, Subcutaneous, Q24H, Haddix, Kevin P, MD, 40 mg at 04/14/20 0849 .  gabapentin (NEURONTIN) capsule 100 mg, 100 mg, Oral, TID, Haddix, Kevin P, MD, 100 mg at 04/14/20 0848 .  HYDROmorphone (DILAUDID) injection 1 mg, 1 mg, Intravenous, Q2H PRN, Haddix, Kevin P, MD, 1 mg at 04/14/20 1405 .  lactated ringers infusion, , Intravenous, Continuous, Haddix, Kevin P, MD, Last Rate: 10 mL/hr at 04/10/20 0858, New Bag at 04/10/20 0858 .  lurasidone (LATUDA) tablet 20 mg, 20 mg, Oral, Daily, Haddix, Kevin P, MD, 20 mg at 04/14/20 0848 .  magnesium citrate solution 1 Bottle, 1 Bottle, Oral, Once PRN, Haddix, Kevin P, MD .  methocarbamol (ROBAXIN) tablet 500 mg, 500 mg, Oral, Q6H PRN, 500 mg at 04/13/20 0631 **OR** methocarbamol (ROBAXIN) 500 mg in  dextrose 5 % 50 mL IVPB, 500 mg, Intravenous, Q6H PRN, Haddix, Kevin P, MD .  metoCLOPramide (REGLAN) tablet 5-10 mg, 5-10 mg, Oral, Q8H PRN **OR** metoCLOPramide (REGLAN) injection 5-10 mg, 5-10 mg, Intravenous, Q8H PRN, Haddix, Kevin P, MD .  naproxen (NAPROSYN) tablet 250 mg, 250 mg, Oral, BID WC, Haddix, Kevin P, MD, 250 mg at 04/14/20 0848 .  ondansetron (ZOFRAN) tablet 4 mg, 4 mg, Oral, Q6H PRN **OR** ondansetron (ZOFRAN) injection 4 mg, 4 mg, Intravenous, Q6H PRN, Haddix, Kevin P, MD, 4 mg at 04/10/20 1127 .  OXcarbazepine (TRILEPTAL) tablet 75 mg, 75 mg, Oral, BID, Haddix, Kevin P, MD, 75 mg at 04/14/20 0848 .  oxybutynin (DITROPAN-XL) 24 hr tablet 10 mg, 10 mg, Oral, Daily, Haddix, Kevin P, MD, 10 mg at 04/14/20 0848 .  oxyCODONE (Oxy IR/ROXICODONE) immediate release tablet 10-15 mg, 10-15 mg, Oral, Q4H PRN, Haddix, Kevin P, MD, 15 mg at 04/14/20 1040 .  polyethylene glycol (MIRALAX / GLYCOLAX) packet 17 g, 17 g, Oral, Daily PRN, Haddix, Kevin P, MD  Patients Current Diet:  Diet Order            Diet regular Room service appropriate? Yes; Fluid consistency: Thin  Diet effective now                 Precautions / Restrictions Precautions Precautions: Fall Precaution Comments: Pt struggles to maintain precautions despite extensive teaching and variety of modes of education Restrictions Weight Bearing Restrictions: Yes RLE Weight Bearing: Non weight bearing   Has the patient had 2 or more falls or a fall with injury in the past year? Yes  Prior Activity Level Limited Community (1-2x/wk): used community transportation, did not go out much, used RW or w/c at baseline   Prior Functional Level Self Care: Did the patient need help bathing, dressing, using the toilet or eating? Independent  Indoor Mobility: Did the patient need assistance with walking from room to room (with or without device)? Independent  Stairs: Did the patient need assistance with internal or external stairs  (with or without device)? Independent  Functional Cognition: Did the patient need help planning regular tasks such as shopping or remembering to take medications? Independent  Home Assistive Devices / Equipment Home Assistive Devices/Equipment: Eyeglasses  Prior Device Use: Indicate devices/aids used by the patient prior to current illness, exacerbation or injury? None of the above  Current Functional Level Cognition  Overall Cognitive Status: Within Functional Limits for tasks assessed Current Attention Level: Focused Orientation Level: Oriented X4 Following Commands: Follows multi-step commands inconsistently, Follows   one step commands inconsistently Safety/Judgement: Decreased awareness of safety, Decreased awareness of deficits General Comments: Required verbal and tactile cues to stay on task, redirect and maintain WB precautions. Pt reeducated on WB precautions and did a better job today of maintaining them.    Extremity Assessment (includes Sensation/Coordination)  Upper Extremity Assessment: Overall WFL for tasks assessed  Lower Extremity Assessment: Defer to PT evaluation RLE Deficits / Details: Pt able to activate hip and knee musculature without discomfort in bed, taught pt bed mobility exercises. RLE: Unable to fully assess due to pain LLE Deficits / Details: Pt able to complete active ROM of ankle, knee and hip through functional activity of sitting EOB, rolling. Pt at least 3/5 mmt grossly    ADLs  Overall ADL's : Needs assistance/impaired Grooming: Set up, Wash/dry hands, Wash/dry face, Sitting Upper Body Bathing: Set up, Sitting Lower Body Bathing: Maximal assistance, +2 for physical assistance, Sit to/from stand Upper Body Dressing : Set up, Sitting Lower Body Dressing: Maximal assistance, +2 for physical assistance General ADL Comments: Patient declined washing front perineal area and buttocks in standing despite +2 assist.     Mobility  Overal bed mobility:  Needs Assistance Bed Mobility: Supine to Sit Rolling: Modified independent (Device/Increase time) Supine to sit: HOB elevated, Min guard Sit to supine: Min assist, HOB elevated General bed mobility comments: Patient very anxious about RLE requesting assistance although paitent is able to advanece limb to EOB with Min guard    Transfers  Overall transfer level: Needs assistance Equipment used: Rolling walker (2 wheeled) Transfer via Lift Equipment: Maximove Transfers: Sit to/from Stand Sit to Stand: Mod assist, +2 physical assistance, From elevated surface Stand pivot transfers: Mod assist, +2 physical assistance General transfer comment: Sit to stand from elevated EOB with Mod A +2 and cueing for hand placement. SPT to recliner with Mod A +2 and cues for proximity to chair.     Ambulation / Gait / Stairs / Wheelchair Mobility  Ambulation/Gait General Gait Details: unable    Posture / Balance Dynamic Sitting Balance Sitting balance - Comments: able clean upper body in sitting Balance Overall balance assessment: Needs assistance Sitting-balance support: Feet supported Sitting balance-Leahy Scale: Good Sitting balance - Comments: able clean upper body in sitting Standing balance support: Bilateral upper extremity supported Standing balance-Leahy Scale: Poor Standing balance comment: Heavy reliance on BUE with RW    Special needs/care consideration Behavioral consideration bipolar/anxiety and Designated visitor Veronica Crawford   Previous Home Environment (from acute therapy documentation) Living Arrangements: Alone Available Help at Discharge: Other (Comment) (none) Home Care Services: No  Discharge Living Setting Plans for Discharge Living Setting: Patient's home Type of Home at Discharge: Apartment Discharge Home Layout: One level Discharge Home Access: Level entry Discharge Bathroom Shower/Tub: Walk-in shower Discharge Bathroom Toilet: Standard Discharge Bathroom  Accessibility: Yes How Accessible: Accessible via wheelchair Does the patient have any problems obtaining your medications?: No  Social/Family/Support Systems Patient Roles: Partner Contact Information: partner, Jeff, is a permanent SNF resident Anticipated Caregiver: neighbor, Veronica, is applying for PCA status for patient Anticipated Caregiver's Contact Information: [phone] Ability/Limitations of Caregiver: min assist Caregiver Availability: 24/7 Discharge Plan Discussed with Primary Caregiver: Yes Is Caregiver In Agreement with Plan?: Yes Does Caregiver/Family have Issues with Lodging/Transportation while Pt is in Rehab?: No  Goals Patient/Family Goal for Rehab: PT/OT supervision to min assist, w/c level and limited ambulatory/transfer Expected length of stay: 14-18 days Pt/Family Agrees to Admission and willing to participate: Yes Program Orientation Provided &   Reviewed with Pt/Caregiver Including Roles  & Responsibilities: Yes  Barriers to Discharge: Insurance for SNF coverage, Lack of/limited family support  Decrease burden of Care through IP rehab admission: n/a  Possible need for SNF placement upon discharge: not anticipated  Patient Condition: I have reviewed medical records from [facility], spoken with CM, and patient. I met with patient at the bedside for inpatient rehabilitation assessment.  Patient will benefit from ongoing PT and OT, can actively participate in 3 hours of therapy a day 5 days of the week, and can make measurable gains during the admission.  Patient will also benefit from the coordinated team approach during an Inpatient Acute Rehabilitation admission.  The patient will receive intensive therapy as well as Rehabilitation physician, nursing, social worker, and care management interventions.  Due to bladder management, bowel management, safety, skin/wound care, disease management, medication administration, pain management and patient education  the patient requires 24 hour a day rehabilitation nursing.  The patient is currently mod assist +2 with mobility and basic ADLs.  Discharge setting and therapy post discharge at home with home health is anticipated.  Patient has agreed to participate in the Acute Inpatient Rehabilitation Program and will admit today.  Preadmission Screen Completed By:  Anina Schnake E Dequavius Kuhner, 04/14/2020 4:16 PM ______________________________________________________________________   Discussed status with Dr. Lovorn on 04/14/20  at 4:27 PM  and received approval for admission today.  Admission Coordinator:  Kwesi Sangha E Manpreet Strey, PT, DPT time  12:00pm Date 04/14/2010 Assessment/Plan: Diagnosis: R ankle fracture 1. Does the need for close, 24 hr/day Medical supervision in concert with the patient's rehab needs make it unreasonable for this patient to be served in a less intensive setting? Yes 2. Co-Morbidities requiring supervision/potential complications: bipolar, anxiety- severe, R tib/fib fx s/p exfix and ORIF, NWB RLE- chronic back pain; constipation 3. Due to bladder management, bowel management, safety, skin/wound care, disease management, medication administration, pain management and patient education, does the patient require 24 hr/day rehab nursing? Yes 4. Does the patient require coordinated care of a physician, rehab nurse, PT, OT, and SLP to address physical and functional deficits in the context of the above medical diagnosis(es)? Yes Addressing deficits in the following areas: balance, endurance, locomotion, strength, transferring, bowel/bladder control, bathing, dressing, feeding, grooming and toileting 5. Can the patient actively participate in an intensive therapy program of at least 3 hrs of therapy 5 days a week? Yes 6. The potential for patient to make measurable gains while on inpatient rehab is fair 7. Anticipated functional outcomes upon discharge from inpatient rehab: modified independent and supervision  PT, modified independent and supervision OT, n/a SLP 8. Estimated rehab length of stay to reach the above functional goals is: 14-18 days 9. Anticipated discharge destination: Home 10. Overall Rehab/Functional Prognosis: fair   MD Signature:  

## 2020-04-17 NOTE — Progress Notes (Signed)
+/-   sleep, didn't want to use purewick. Requesting bedpan or female urinal to void. Ice PRN to right ankle/foot. PRN oxy IR given at [place]. Continues to refuse scheduled colace and other laxatives. Patient continues to report her body will let her know when she needs to have a BM. Bowel sounds (+) x 4 quads. Patrici Ranks A

## 2020-04-18 ENCOUNTER — Inpatient Hospital Stay (HOSPITAL_COMMUNITY): Payer: Medicaid Other | Admitting: Physical Therapy

## 2020-04-18 ENCOUNTER — Inpatient Hospital Stay (HOSPITAL_COMMUNITY): Payer: Medicaid Other | Admitting: Occupational Therapy

## 2020-04-18 ENCOUNTER — Inpatient Hospital Stay (HOSPITAL_COMMUNITY): Payer: Medicaid Other

## 2020-04-18 MED ORDER — ALPRAZOLAM 0.25 MG PO TABS
0.2500 mg | ORAL_TABLET | Freq: Two times a day (BID) | ORAL | Status: DC
  Administered 2020-04-18 – 2020-04-21 (×6): 0.25 mg via ORAL
  Filled 2020-04-18 (×7): qty 1

## 2020-04-18 NOTE — Patient Care Conference (Signed)
Inpatient RehabilitationTeam Conference and Plan of Care Update Date: 04/18/2020   Time: 2:25 PM    Patient Name: Jordan [name]      Medical Record Number: [id number]  Date of Birth: [birth year] Sex: Female         Room/Bed: 4W09C/4W09C-01 Payor Info: Payor: MEDICAID [place] / Plan: MEDICAID  ACCESS / Product Type: *No Product type* /    Admit Date/Time:  04/15/2020  4:22 PM  Primary Diagnosis:  Tibia/fibula fracture, right, sequela  Patient Active Problem List   Diagnosis Date Noted  . Tibia/fibula fracture, right, sequela 04/15/2020  . Closed fracture of medial malleolus of right ankle 04/10/2020  . Open fracture of distal end of fibula and tibia, right, type I or II, initial encounter 04/09/2020  . Vaginal cancer ([place]) 07/22/2019  . Morbid obesity with BMI of 40.0-44.9, adult ([place]) 06/11/2019  . 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[place]) 05/28/2019  . Visit for routine gyn exam 04/21/2019  . Vaginal atrophy 04/21/2019  . Painful lumpy right breast 07/27/2016  . Urinary tract infection 07/01/2015  . Diastolic dysfunction 26/71/2458  . Constipation 05/12/2015  . ESBL (extended spectrum beta-lactamase) producing bacteria infection 03/17/2015  . Diarrhea 03/03/2015  . Weakness 03/02/2015  . 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 Generalized anxiety disorder 02/20/2015  . Claustrophobia 02/20/2015  . Transaminitis 02/18/2015  . Chest pain 02/18/2015  . Abdominal pain   . Dizziness   . Hypotension 01/04/2015  . Bipolar disorder ([place]) 06/29/2012    Expected Discharge Date: Expected Discharge Date: 05/03/20  Team Members Present: Physician leading conference: Dr. Alger Simons Care Coodinator Present: Loralee Pacas, LCSWA;Adonai Selsor Creig Hines, RN, BSN, [place] Nurse Present: Ellison Carwin, LPN PT Present: Apolinar Junes, PT OT Present: Cherylynn Ridges, OT PPS Coordinator present : Gunnar Fusi, SLP     Current Status/Progress Goal Weekly Team Focus  Bowel/Bladder    Incontinent of Bladder, continent of bowel, LBM  Maintain continence  QS/PRN address toileting needs Q 2 hrs and prn,   Swallow/Nutrition/ Hydration             ADL's   Mod A for AP transfer, Mod A  bed level LB ADLs, pt very self limiting with anxiety  Mod I/set-up A, pt with limited home support  transfer training, self-care retraining, activity tolerance, pain and anxiety management, OOB tolerance   Mobility   Min A bed mobility with bed rails, mod A of 1-2 for slide board and A/P transfers, difficulty maintaining R LE NWB with all mobility, very limited by pain/anxiety  mod I w/c level  Activity tolerance, strengthening/ROM, functional mobility, w/c mobility, pain and anxiety managment, adherence to precautions, patient education   Communication             Safety/Cognition/ Behavioral Observations            Pain   verbalize pain surgical area right Tib/Fib Fx, with PRN Q4 hrs pain  Pain < =3  QS/PRN assess   Skin   Surgical wound with no drainage, ace wrap/ form dressing intact, sutures intact  Prevent infection  Assess QS/PRN    Rehab Goals Patient on target to meet rehab goals: Yes *See Care Plan and progress notes for long and short-term goals.     Barriers to Discharge  Current Status/Progress Possible Resolutions Date Resolved   Nursing                  PT  Home environment access/layout;Decreased caregiver support;Lack of/limited family support;Weight bearing restrictions;Behavior  Patient  in need of bariatric equipment, will need to ensure there is space for this equipment in her apartment, R LE NWB with poor patient compliance, limited to no physical assist or supervision at d/c, patient with significant anxiety and pain with mobility              OT Decreased caregiver support;Weight;Weight bearing restrictions                SLP                Care Coordinator Decreased caregiver support;Insurance for SNF coverage;Lack of/limited family support;Weight bearing  restrictions              Discharge Planning/Teaching Needs:  D/c to home alone with PRN support from neighbor. Pt domestic partner is in a SNF.  Family education as recommended by therapy   Team Discussion:  Continent of bowel/bladder, with episodes incontinent bladder. Pain not controlled, limiting therapy. Pt requires lots of encouragement. Weight bearing precautions are limiting as pt is struggling with them. Pt min AP transfer. Requesting Neuropsych and PCS referral.   Revisions to Treatment Plan:  MD adding Xanax 0.25 mg BID    Medical Summary Current Status: right tib fib fx after fall. wounds clean, very anxious/bipolar, edema RLE Weekly Focus/Goal: anxiety mgt, wound care/edema control, orthotic mgt  Barriers to Discharge: Medical stability;Medication compliance   Possible Resolutions to Barriers: ongoing med mgt, neuropsych assessment/recs, pain mgt   Continued Need for Acute Rehabilitation Level of Care: The patient requires daily medical management by a physician with specialized training in physical medicine and rehabilitation for the following reasons: Direction of a multidisciplinary physical rehabilitation program to maximize functional independence : Yes Medical management of patient stability for increased activity during participation in an intensive rehabilitation regime.: Yes Analysis of laboratory values and/or radiology reports with any subsequent need for medication adjustment and/or medical intervention. : Yes   I attest that I was present, lead the team conference, and concur with the assessment and plan of the team.   Cristi Loron 04/18/2020, 2:25 PM

## 2020-04-18 NOTE — Progress Notes (Signed)
Patient ID: Jordan [name], female   DOB: 1966-01-04, 54 y.o.   MRN: [id number]  SW met with pt in room to provide udpates from team conference, and d/c date 7/14. SW informed pt there will continue to be updates if this changes. Pt discussed her friend being her aide and the forms that were sent to previous floor. SW indicated will check her chart to see if forms are present. SW to update pt if able to locate. SW reminded pt that an official referral will be sent to [employer] for [employer]   *SW was able to find [id number] forms in chart. Forms are the same as for [employer]SW to fax forms prior to discharge for PCS.   Loralee Pacas, MSW, [place] Office: [phone] Cell: [phone] Fax: [phone]

## 2020-04-18 NOTE — IPOC Note (Signed)
Overall Plan of Care [facility]) Patient Details Name: Jordan [name] MRN: [id number] DOB: 03-26-1966  Admitting Diagnosis: Tibia/fibula fracture, right, sequela  Hospital Problems: Principal Problem:   Tibia/fibula fracture, right, sequela Active Problems:   Bipolar disorder ([place])     Functional Problem List: Nursing Bladder, Bowel, Behavior, Edema, Endurance, Medication Management, Pain  PT Balance, Behavior, Edema, Endurance, Motor, Nutrition, Pain, Safety, Sensory, Skin Integrity  OT Balance, Safety, Skin Integrity, Endurance, Motor, Pain  SLP    TR         Basic ADL's: OT Grooming, Bathing, Dressing, Toileting     Advanced  ADL's: OT Simple Meal Preparation     Transfers: PT Bed to Chair, Car, Furniture, [employer], Metallurgist: PT Ambulation, Emergency planning/management officer, Stairs     Additional Impairments: OT None  SLP        TR      Anticipated Outcomes Item Anticipated Outcome  Self Feeding No goal  Swallowing      Basic self-care  Mod I  Toileting  Mod I   Bathroom Transfers Supervision/setup-Mod I  Bowel/Bladder  cont x 2, mod I assist  Transfers  mod I using LRAD  Locomotion  mod I using LRAD  Communication     Cognition     Pain  less than 4 on 0-10 scale  Safety/Judgment  supervision assist   Therapy Plan: PT Intensity: Minimum of 1-2 x/day ,45 to 90 minutes PT Frequency: 5 out of 7 days PT Duration Estimated Length of Stay: 14-18 days OT Intensity: Minimum of 1-2 x/day, 45 to 90 minutes OT Frequency: 5 out of 7 days OT Duration/Estimated Length of Stay: 14-16 days     Due to the current state of emergency, patients may not be receiving their 3-hours of Medicare-mandated therapy.   Team Interventions: Nursing Interventions Patient/Family Education, Pain Management, Bladder Management, Bowel Management, Skin Care/Wound Management, Discharge Planning  PT interventions Ambulation/gait training, Community  reintegration, DME/adaptive equipment instruction, Neuromuscular re-education, Psychosocial support, Stair training, UE/LE Strength taining/ROM, Wheelchair propulsion/positioning, Training and development officer, Discharge planning, Functional electrical stimulation, Pain management, Skin care/wound management, Therapeutic Activities, UE/LE Coordination activities, Disease management/prevention, Functional mobility training, Patient/family education, Splinting/orthotics, Therapeutic Exercise  OT Interventions Balance/vestibular training, Discharge planning, Pain management, Self Care/advanced ADL retraining, Therapeutic Activities, UE/LE Coordination activities, Therapeutic Exercise, Patient/family education, Disease mangement/prevention, Functional mobility training, Community reintegration, Engineer, drilling, Psychosocial support, UE/LE Strength taining/ROM, Wheelchair propulsion/positioning  SLP Interventions    TR Interventions    SW/CM Interventions Discharge Planning, Psychosocial Support, Patient/Family Education   Barriers to Discharge MD  Behavior  Nursing      PT Home environment access/layout, Decreased caregiver support, Lack of/limited family support, Weight bearing restrictions, Behavior Patient in need of bariatric equipment, will need to ensure there is space for this equipment in her apartment, R LE NWB with poor patient compliance, limited to no physical assist or supervision at d/c, patient with significant anxiety and pain with mobility  OT Decreased caregiver support, Weight, Weight bearing restrictions    SLP      SW Decreased caregiver support, Insurance for SNF coverage, Lack of/limited family support, Weight bearing restrictions     Team Discharge Planning: Destination: PT-Home ,OT- Home , SLP-  Projected Follow-up: PT-Home health PT, OT-  Home health OT, SLP-  Projected Equipment Needs: PT- , OT- To be determined, SLP-  Equipment Details: PT-patient has  no equipment at this time, will determine DME need based on patient's progress,  OT-  Patient/family involved in discharge planning: PT- Patient,  OT-Patient, SLP-   MD ELOS: 14-18 days Medical Rehab Prognosis:  Good Assessment: The patient has been admitted for CIR therapies with the diagnosis of right tib-fib comminuted fxs, hx of bipolar disorder. The team will be addressing functional mobility, strength, stamina, balance, safety, adaptive techniques and equipment, self-care, bowel and bladder mgt, patient and caregiver education, pain mgt, wb precautions, orthotics, wound care. Goals have been set at mod I with mobility and self-care. May need w/c ultimately. Anxiety is a big barrier at this point.   Due to the current state of emergency, patients may not be receiving their 3 hours per day of Medicare-mandated therapy.    Meredith Staggers, MD, FAAPMR      See Team Conference Notes for weekly updates to the plan of care

## 2020-04-18 NOTE — Progress Notes (Signed)
[facility] PHYSICAL MEDICINE & REHABILITATION PROGRESS NOTE   Subjective/Complaints:  Has a lot of questions still about RLE including fit of splint, pain, swelling, etc. Admits to ongoing anxiety and difficulty being able to stand without putting weight on RLE.   ROS: Patient denies fever, rash, sore throat, blurred vision, nausea, vomiting, diarrhea, cough, shortness of breath or chest pain .    Objective:   No results found. Recent Labs    04/16/20 0522  WBC 5.0  HGB 10.3*  HCT 31.0*  PLT 191   Recent Labs    04/16/20 0522  NA 135  K 4.6  CL 100  CO2 27  GLUCOSE 120*  BUN 10  CREATININE 0.61  CALCIUM 8.5*    Intake/Output Summary (Last 24 hours) at 04/18/2020 0928 Last data filed at 04/18/2020 2683 Gross per 24 hour  Intake 760 ml  Output 250 ml  Net 510 ml     Physical Exam: Vital Signs Blood pressure 107/81, pulse 94, temperature 98.5 F (36.9 C), resp. rate 17, height 5\' 8"  (1.727 m), weight (!) 147.6 kg, SpO2 99 %.  Constitutional: No distress . Vital signs reviewed. HEENT: EOMI, oral membranes moist Neck: supple Cardiovascular: RRR without murmur. No JVD    Respiratory/Chest: CTA Bilaterally without wheezes or rales. Normal effort    GI/Abdomen: BS +, non-tender, non-distended Ext: no clubbing, cyanosis, or edema Psych: anxious, tangential Musculoskeletal:     Cervical back: Normal range of motion and neck supple. No rigidity.   RLE: 2+ edema but leg wrapped appropriately.    Neuro:  Comments: UEs B/L 5/5 in deltoid, biceps, triceps, grip and finger abd B/L LLE- 5/5 in HF, KE, KF, DF and PF RLE-limited by pain. Is able to ADF/PF. Sensation intact Skin: numerous abrasions/incisions with sutures in RLE    Assessment/Plan: 1. Functional deficits secondary to RLE tib/fib fx s/p ex-fix and ORIF_ NWB on RLE which require 3+ hours per day of interdisciplinary therapy in a comprehensive inpatient rehab setting.  Physiatrist is providing close team  supervision and 24 hour management of active medical problems listed below.  Physiatrist and rehab team continue to assess barriers to discharge/monitor patient progress toward functional and medical goals  Care Tool:  Bathing  Bathing activity did not occur:  (N/A) Body parts bathed by patient: Right arm, Left arm, Chest, Abdomen, Right upper leg, Left upper leg, Face, Front perineal area, Left lower leg   Body parts bathed by helper: Buttocks Body parts n/a: Right lower leg   Bathing assist Assist Level: Minimal Assistance - Patient > 75%     Upper Body Dressing/Undressing Upper body dressing   What is the patient wearing?: Hospital gown only    Upper body assist Assist Level: Set up assist    Lower Body Dressing/Undressing Lower body dressing      What is the patient wearing?: Incontinence brief     Lower body assist Assist for lower body dressing: Maximal Assistance - Patient 25 - 49%     Toileting Toileting Toileting Activity did not occur (Clothing management and hygiene only): N/A (no void or bm)  Toileting assist Assist for toileting: Maximal Assistance - Patient 25 - 49%     Transfers Chair/bed transfer  Transfers assist  Chair/bed transfer activity did not occur: Safety/medical concerns (unable to maintain R LE NWB)        Locomotion Ambulation   Ambulation assist   Ambulation activity did not occur: Safety/medical concerns (unable to maintain R LE NWB  in standing)          Walk 10 feet activity   Assist  Walk 10 feet activity did not occur: Safety/medical concerns        Walk 50 feet activity   Assist Walk 50 feet with 2 turns activity did not occur: Safety/medical concerns         Walk 150 feet activity   Assist Walk 150 feet activity did not occur: Safety/medical concerns         Walk 10 feet on uneven surface  activity   Assist Walk 10 feet on uneven surfaces activity did not occur: Safety/medical concerns          Wheelchair     Assist Will patient use wheelchair at discharge?: Yes Type of Wheelchair: Manual    Wheelchair assist level: Supervision/Verbal cueing Max wheelchair distance: 15 ft    Wheelchair 50 feet with 2 turns activity    Assist    Wheelchair 50 feet with 2 turns activity did not occur:  (decreased activity tolerance)   Assist Level: Maximal Assistance - Patient 25 - 49%   Wheelchair 150 feet activity     Assist  Wheelchair 150 feet activity did not occur:  (decreased activity tolerance)   Assist Level: Total Assistance - Patient < 25%   Blood pressure 107/81, pulse 94, temperature 98.5 F (36.9 C), resp. rate 17, height 5\' 8"  (1.727 m), weight (!) 147.6 kg, SpO2 99 %.  Medical Problem List and Plan: 1.  Impaired function secondary to RLE tib/fib comminuted fxs after fall-ORIF 6/21 by Dr. Doreatha Martin is NWB on RLE             -patient may  Shower if RLE is covered             -ELOS/Goals: 14-18 days- mod I to supervision  -team conf today 2.  Antithrombotics: -DVT/anticoagulation:  Pharmaceutical: Lovenox             -antiplatelet therapy: N/A 3. Pain Management: prn oxycodone   -gabapentin 100mg  tid  -ice/heat, edmea control, reassurance 4. Mood: LCSW to follow for evaluation and support.              -antipsychotic agents: On Latuda  -have asked Dr. Sima Matas to see re: anxiety,? PTSD in setting of her baseline bipolar disorder   -continue trileptal and latuda per baseline   -added 0.125 xanax before am and pm therapies also 5. Neuropsych: This patient is capable of making decisions on her own behalf. 6. Skin/Wound Care: Routine pressure relief measures.    -changed to foam dressings to RLE  -ACE wrap and elevation for edema control.  7. Fluids/Electrolytes/Nutrition: encourage po.     9. H/o Nocturia/frequent UTIs: On ditropan.   -oob to void 10. Constipation: Has not had BM--does not want any laxatives  "wants to do on her own" -no BM in 7+  days- but since this is "normal" refuses all bowel meds   6/28 moved her bowels using "pickles". I told her that i'm ok with whatever she wants to use.   -will have miralax available prn     LOS: 3 days A FACE TO [place] 04/18/2020, 9:28 AM

## 2020-04-18 NOTE — Progress Notes (Signed)
Occupational Therapy Session Note  Patient Details  Name: Jordan [name] MRN: [id number] Date of Birth: 06/13/1966  Today's Date: 04/18/2020 OT Individual Time: 1115-1200 OT Individual Time Calculation (min): 45 min    Short Term Goals: Week 1:  OT Short Term Goal 1 (Week 1): Pt will complete a BSC or toilet transfer with 1 assist using LRAD while adhering to WB precautions OT Short Term Goal 2 (Week 1): Pt will complete 1 grooming task while sitting at the sink to increase OOB tolerance OT Short Term Goal 3 (Week 1): Pt will engage in 1 simulated IADL task w/c level with supervision assistance  Skilled Therapeutic Interventions/Progress Updates:    Patient in bed, alert, states that pain in her right LE is level "10"  Completed repositioning and provided ice pack.  Sponge bath completed bed level with set up and assistance for buttocks (right LE na).  She requires max A to donn clean brief.  Attempted using urinal with max A but unable to void at this time.  She donns clean hospital gown with set up.  Oral care completed with set up.  Rolling in bed both right and left using bed rail with CS.  Supine to sitting edge of bed min A for right LE.  She tolerates unsupported sitting with trunk and UB activity for 10 minutes.  Returned to supine position with min A for right LE.  She remained in bed at close of session with bed alarm set and call bell in hand.    Therapy Documentation Precautions:  Precautions Precautions: Fall Precaution Comments: has anxiety with mobility Required Braces or Orthoses: Other Brace Other Brace: CAM boot to R LE Restrictions Weight Bearing Restrictions: Yes RLE Weight Bearing: Non weight bearing   Therapy/Group: Individual Therapy  Carlos Levering 04/18/2020, 7:44 AM

## 2020-04-18 NOTE — Progress Notes (Signed)
Inpatient Rehabilitation Care Coordinator Assessment and Plan  Patient Details  Name: Jordan [name] MRN: [id number] Date of Birth: 1965/12/26  Today's Date: 04/18/2020  Problem List:  Patient Active Problem List   Diagnosis Date Noted  . Tibia/fibula fracture, right, sequela 04/15/2020  . Closed fracture of medial malleolus of right ankle 04/10/2020  . Open fracture of distal end of fibula and tibia, right, type I or II, initial encounter 04/09/2020  . Vaginal cancer ([place]) 07/22/2019  . Morbid obesity with BMI of 40.0-44.9, adult ([place]) 06/11/2019  . 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 Visit for routine gyn exam 04/21/2019  . Vaginal atrophy 04/21/2019  . Painful lumpy right breast 07/27/2016  . Urinary tract infection 07/01/2015  . Diastolic dysfunction 12/19/3141  . Constipation 05/12/2015  . ESBL (extended spectrum beta-lactamase) producing bacteria infection 03/17/2015  . Diarrhea 03/03/2015  . Weakness 03/02/2015  . 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 Generalized anxiety disorder 02/20/2015  . Claustrophobia 02/20/2015  . Transaminitis 02/18/2015  . Chest pain 02/18/2015  . Abdominal pain   . Dizziness   . Hypotension 01/04/2015  . Bipolar disorder ([place]) 06/29/2012   Past Medical History:  Past Medical History:  Diagnosis Date  . Alcohol abuse   . Anemia    patient denies  . Bipolar 1 disorder ([facility])    No medications currently  . CAP (community acquired pneumonia) 03/17/2015  . 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 Mental disorder   . Obesity   . PICC line infection 05/17/2015  . Sepsis due to Gram negative bacteria (MDR E Coli) 02/18/2015  . 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 UTI (lower urinary tract infection)   . Vaginal Pap smear, abnormal    Past Surgical History:  Past Surgical History:  Procedure Laterality Date  . ABDOMINAL HYSTERECTOMY    . CERVICAL CONIZATION W/BX N/A 07/01/2016   Procedure: CONIZATION  CERVIX WITH BIOPSY;  Surgeon: Chancy Milroy, MD;  Location: [facility];  Service: Gynecology;  Laterality: N/A;  . CHOLECYSTECTOMY    . EXTERNAL FIXATION LEG Right 04/09/2020   Procedure: EXTERNAL FIXATION LEG;  Surgeon: Hiram Gash, MD;  Location: [facility];  Service: Orthopedics;  Laterality: Right;  . HYSTEROSCOPY WITH D & C N/A 01/25/2016   Procedure: DILATATION AND CURETTAGE /HYSTEROSCOPY;  Surgeon: Mora Bellman, MD;  Location: [facility];  Service: Gynecology;  Laterality: N/A;  . LYMPH NODE BIOPSY Bilateral 07/22/2019   Procedure: LYMPH NODE BIOPSY;  Surgeon: Everitt Amber, MD;  Location: [facility];  Service: Gynecology;  Laterality: Bilateral;  . OPEN REDUCTION INTERNAL FIXATION (ORIF) TIBIA/FIBULA FRACTURE Right 04/10/2020   Procedure: OPEN REDUCTION INTERNAL FIXATION (ORIF) TIBIA/FIBULA FRACTURE;  Surgeon: Shona Needles, MD;  Location: [facility];  Service: Orthopedics;  Laterality: Right;  . ROBOT ASSISTED MYOMECTOMY N/A 07/22/2019   Procedure: XI ROBOTIC ASSISTED LAPAROSCOPIC RADICAL UPPER VAGINECTOMY, LEFT SALPINECTOMY, RIGHT SALPINGOOOPHERECTOMY;  Surgeon: Everitt Amber, MD;  Location: [facility];  Service: Gynecology;  Laterality: N/A;  . VAGINAL HYSTERECTOMY N/A 03/11/2017   Procedure: HYSTERECTOMY VAGINAL WITH MORCELLATION;  Surgeon: Chancy Milroy, MD;  Location: [facility];  Service: Gynecology;  Laterality: N/A;   Social History:  reports that she has never smoked. She has never used smokeless tobacco. She reports previous alcohol use. She reports that she does not use drugs.  Family / Support Systems Marital Status: Single Patient Roles: Partner Spouse/Significant Other: Merry Proud (lives in a SNF) Children: 1 adult dtr- lives in  [place] Other Supports: neighbor Anticipated Caregiver: self Ability/Limitations of Caregiver: N/A Caregiver Availability: Intermittent Family Dynamics: Pt lives alone with intermittent support from neighbor  Social History Preferred language: English Religion:  Christian Cultural Background: Pt is disabled due to mental health diagnosis and lives alone. Domestic partner lives in a SNF Education: high school Read: Yes Write: Yes Employment Status: Disabled Date Retired/Disabled/Unemployed: Tree surgeon Issues: Denies Guardian/Conservator: N/A   Abuse/Neglect Abuse/Neglect Assessment Can Be Completed: Yes Physical Abuse: Denies Verbal Abuse: Denies Sexual Abuse: Denies Exploitation of patient/patient's resources: Denies Self-Neglect: Denies  Emotional Status Pt's affect, behavior and adjustment status: Pt anxious during time of visit as focused on how she will manage when she discharges home. Recent Psychosocial Issues: Admits to current anxiety Psychiatric History: Hx of bipolar disorder; medication management at [facility] Substance Abuse History: Denies  Patient / Family Perceptions, Expectations & Goals Pt/Family understanding of illness & functional limitations: Pt has general understanding of care needs Premorbid pt/family roles/activities: Independent Anticipated changes in roles/activities/participation: Assistance with ADLs/IADLs  Recruitment consultant: None Premorbid Home Care/DME Agencies: None Transportation available at discharge: friends Resource referrals recommended: Neuropsychology  Discharge Planning Living Arrangements: Alone Support Systems: Friends/neighbors Type of Residence: Private residence Insurance Resources: [employer] (specify county) Museum/gallery curator Resources: [employer] Financial Screen Referred: No Living Expenses: Education officer, community Management: Patient Does the patient have any problems obtaining your medications?: No Care Coordinator Barriers to Discharge: Decreased caregiver support, Insurance for SNF coverage, Lack of/limited family support, Weight bearing restrictions Care Coordinator Anticipated Follow Up Needs: HH/OP Expected length of stay: 14-18  days  Clinical Impression SW met with pt in room at bedside to introduce self, explain role, and discuss discharge process. Pt does not have a HCPOA. States she has discussed with her landlord, and she is willing to be HCPOA if ever needed,. DME: cane/walker.  Rana Snare 04/18/2020, 12:35 PM

## 2020-04-18 NOTE — Progress Notes (Signed)
Occupational Therapy Session Note  Patient Details  Name: Jordan [name] MRN: [id number] Date of Birth: 1966-05-09  Today's Date: 04/18/2020  Session 1 OT Individual Time: 3785-8850 OT Individual Time Calculation (min): 45 min   Session 2 OT Individual Time: 1300-1400 OT Individual Time Calculation (min): 60 min    Short Term Goals: Week 1:  OT Short Term Goal 1 (Week 1): Pt will complete a BSC or toilet transfer with 1 assist using LRAD while adhering to WB precautions OT Short Term Goal 2 (Week 1): Pt will complete 1 grooming task while sitting at the sink to increase OOB tolerance OT Short Term Goal 3 (Week 1): Pt will engage in 1 simulated IADL task w/c level with supervision assistance  Skilled Therapeutic Interventions/Progress Updates:  Session 1   Pt greeted sitting in wc with LEs elevated on bed. Pt reported she had already been incontinent of BM and needed to get back to bed. OT discussed BSC transfer, but pt refused right now 2/2 already having gone. Pt completed AP transfer back to bed with OT assisting with R LE and holding chair. Pt with improved reciprocal scoot to scoot hips in and out of bed. Pt rolled with supervision to the R side, but she had not had a BM, only slight smear when cleaning. Pt able to complete front peri-care. Pt then reported need to have a BM. Pt stated she did not have time to try to get to [facility]. OT placed bed pan and pt with continent void of bowel and bladder, however bed pan spilled on the bed. Pt needed total A for posterior peri-care 2/2 body habitus and unable to reach behind. OT changed bed sheets using rolling method. Pt left semi-reclined in bed with needs met.   Session 2 Pt greeted semi-reclined in bed and agreeable to try [facility] transfer this afternoon. OT obtained wide drop arm BSC and brought into room for pt. Pt did not need to use the bathroom, but was agreeable to still practice. Pt completed AP transfer from bed to wide bariatric BSC with OT  assist for R LE management and assist to maintain position of BSC. OT educated on lateral leans for clothing management and peri-care when she does have to use the bathroom. OT encouraged pt to try this with nurse techs and nurse tech informed of how pt transferred.Pt transferred back onto bed in similar fashion with good reciprocal scooting. Pt came to sitting at EOB and completed there-ex. UB there-ex using green theraband 3 sets of 10, chest pull, overhead reach, and bicep curl. Pt with difficulty maintaining proper body mechanics and needed multiple demonstrations and hand over hand A at times. Pt then completed 3 sets of 10 knee extension and seated hip extension. Pt needed OT assist to achieve hip extension on R side. Pt returned to bed with min A to lift R LE into bed. Pt left with bed alarm on,k call bell in reach, and needs met.   Therapy Documentation Precautions:  Precautions Precautions: Fall Precaution Comments: has anxiety with mobility Required Braces or Orthoses: Other Brace Other Brace: CAM boot to R LE Restrictions Weight Bearing Restrictions: Yes RLE Weight Bearing: Non weight bearing Pain: Pain Assessment Pain Scale: 0-10 Pain Score: 10-Worst pain ever Faces Pain Scale: Hurts worst Pain Type: Acute pain;Surgical pain Pain Location: Foot Pain Orientation: Right Pain Descriptors / Indicators: Aching;Discomfort Pain Frequency: Constant Patients Stated Pain Goal: 3 Pain Intervention(s): Medication (See eMAR);Rest (heat) Multiple Pain Sites: No   Therapy/Group:  Individual Therapy  Valma Cava 04/18/2020, 2:02 PM

## 2020-04-18 NOTE — Plan of Care (Signed)
  Problem: RH BOWEL ELIMINATION Goal: RH STG MANAGE BOWEL WITH ASSISTANCE Description: STG Manage Bowel with mod I Assistance. Outcome: Progressing Goal: RH STG MANAGE BOWEL W/MEDICATION W/ASSISTANCE Description: STG Manage Bowel with Medication with min Assistance. Outcome: Progressing   Problem: RH BLADDER ELIMINATION Goal: RH STG MANAGE BLADDER WITH ASSISTANCE Description: STG Manage Bladder With mod I Assistance Outcome: Progressing

## 2020-04-18 NOTE — Progress Notes (Signed)
Physical Therapy Session Note  Patient Details  Name: Jordan [name] MRN: [id number] Date of Birth: 23-Jan-1966  Today's Date: 04/18/2020 PT Individual Time: 5732-2025 PT Individual Time Calculation (min): 32 min   Short Term Goals: Week 1:  PT Short Term Goal 1 (Week 1): Patient will perform bed mobility with supervision without use of bed rails. PT Short Term Goal 2 (Week 1): Patient will perform basic transfers using LRAD with min A while maintaining R LE NWB precautions. PT Short Term Goal 3 (Week 1): Patient will propel the w/c >50 ft with supervision.  Skilled Therapeutic Interventions/Progress Updates:     Patient in bed upon PT arrival. Patient alert and agreeable to PT session. Patient reported 10/10 R LE pain during session, RN made aware. PT provided repositioning, rest breaks, and distraction as pain interventions throughout session.   Patient reported posterior ankle pain from ACE wrap. Unwrapped ACE wrap and noted increased redness and irritation on skin over achilles tendon from ACE wrap bunching behind her ankle. RN made aware and suggested placement of foam dressing over area of irritation. Place foam dressing and re-applied 2-4" ACE wraps for edema control. Educated patient on importance of edema control and positioning in bed for edema and pain management throughout.   Therapeutic Exercise: Patient performed the following exercises during assessment and donning/doffing of ACE wraps with verbal and tactile cues for proper technique. -R SAQ 2x8 -R SLR 2x8 -B glut sets 2x10  Patient in bed with R LE elevated and ice applied at end of session with breaks locked, bed alarm set, and all needs within reach.    Therapy Documentation Precautions:  Precautions Precautions: Fall Precaution Comments: has anxiety with mobility Required Braces or Orthoses: Other Brace Other Brace: CAM boot to R LE Restrictions Weight Bearing Restrictions: Yes RLE Weight Bearing: Non weight  bearing    Therapy/Group: Individual Therapy  Leiah Giannotti L Terisha Losasso PT, DPT  04/18/2020, 5:49 PM

## 2020-04-18 NOTE — Progress Notes (Signed)
Physical Therapy Session Note  Patient Details  Name: Jordan [name] MRN: [id number] Date of Birth: Mar 08, 1966  Today's Date: 04/18/2020 PT Individual Time: 0805-0907 PT Individual Time Calculation (min): 62 min   Short Term Goals: Week 1:  PT Short Term Goal 1 (Week 1): Patient will perform bed mobility with supervision without use of bed rails. PT Short Term Goal 2 (Week 1): Patient will perform basic transfers using LRAD with min A while maintaining R LE NWB precautions. PT Short Term Goal 3 (Week 1): Patient will propel the w/c >50 ft with supervision.  Skilled Therapeutic Interventions/Progress Updates:   Pt received supine in bed and agreeable to therapy session. Pt wearing ACE wrap on R LE. Upon initiation of movement pt suddenly states "Oh no, I'm pooping" - she reports chronic incontinence of her bowels stating sometimes she can make it to the bathroom and other times she is incontinent in her clothes and has to go to the bathroom to clean up at home. Pt wearing purewick - reports this is for night time. Pt requires increased time to complete BM in brief prior to performing hygiene. Rolling R/L in bed using bedrails with supervision while therapist performed total assist LB clothing management and peri-care for cleanliness - required increased time to complete. RN in/out for medication administration as pt requests pain medicine prior to starting therapy. Donned R LE CAM boot max assist. Supine>sitting L EOB using bedrails with supervision. MD in/out for morning assessment - recommends continuing to use current R LE CAM brace with spacers to improve fit and continue with edema management via ACE wraps and elevation. Initiated R lateral scoot transfer EOB>w/c using transfer board with total assist for board placement, education on L lateral trunk lean to allow R hip elevation and place transfer board, initiated transfer using 4" step under L foot to improve floor-seat height but upon multiple  attempts at scooting pt demonstrating minimal to no movement of hips therefore pt requesting to transition to A/P transfer. Therapist readjusted equipment and placed w/c for transfer, using transfer board under w/c cushion seat to provide bridge between bed and w/c for pt safety. Pt performed posterior scoot into w/c with +2 assist stabilizing w/c and therapist assisting with R LE management to maintain NWB - pt requires significantly increased time to complete this with inability to push up sufficiently with UEs to clear hips from bed and minimal ability to weight shift R/L to perform sequential scooting - therapist provided mod assist at end of transfer to get hips fully back in w/c. Pt then reports she is becoming incontinent of bowels again - NT notified and Grayland Ormond, OT scheduled to assume care of pt immediately after this session and therapist educated pt/staff to wait for her assistance back to bed - left with needs in reach.   Therapy Documentation Precautions:  Precautions Precautions: Fall Precaution Comments: has anxiety with mobility Required Braces or Orthoses: Other Brace Other Brace: CAM boot to R LE Restrictions Weight Bearing Restrictions: Yes RLE Weight Bearing: Non weight bearing  Pain:   Reports 10/10 pain in R LE when RN inquired but otherwise does not demonstrate significant grimacing with mobility.    Therapy/Group: Individual Therapy  Tawana Scale, PT, DPT 04/18/2020, 7:50 AM

## 2020-04-19 ENCOUNTER — Inpatient Hospital Stay (HOSPITAL_COMMUNITY): Payer: Medicaid Other

## 2020-04-19 LAB — BASIC METABOLIC PANEL
Anion gap: 8 (ref 5–15)
BUN: 12 mg/dL (ref 6–20)
CO2: 27 mmol/L (ref 22–32)
Calcium: 8.7 mg/dL — ABNORMAL LOW (ref 8.9–10.3)
Chloride: 102 mmol/L (ref 98–111)
Creatinine, Ser: 0.68 mg/dL (ref 0.44–1.00)
GFR calc Af Amer: >60 mL/min (ref >60)
GFR calc non Af Amer: >60 mL/min (ref >60)
Glucose, Bld: 104 mg/dL — ABNORMAL HIGH (ref 70–99)
Potassium: 4.5 mmol/L (ref 3.5–5.1)
Sodium: 137 mmol/L (ref 135–145)

## 2020-04-19 LAB — CBC
HCT: 32.2 % — ABNORMAL LOW (ref 36.0–46.0)
Hemoglobin: 10.6 g/dL — ABNORMAL LOW (ref 12.0–15.0)
MCH: 32.7 pg (ref 26.0–34.0)
MCHC: 32.9 g/dL (ref 30.0–36.0)
MCV: 99.4 fL (ref 80.0–100.0)
Platelets: 204 10*3/uL (ref 150–400)
RBC: 3.24 MIL/uL — ABNORMAL LOW (ref 3.87–5.11)
RDW: 14.8 % (ref 11.5–15.5)
WBC: 4 10*3/uL (ref 4.0–10.5)
nRBC: 0 % (ref 0.0–0.2)

## 2020-04-19 LAB — URINALYSIS, ROUTINE W REFLEX MICROSCOPIC
Bilirubin Urine: NEGATIVE
Glucose, UA: NEGATIVE mg/dL
Ketones, ur: NEGATIVE mg/dL
Nitrite: POSITIVE — AB
Protein, ur: NEGATIVE mg/dL
Specific Gravity, Urine: 1.012 (ref 1.005–1.030)
WBC, UA: >50 WBC/hpf — ABNORMAL HIGH (ref 0–5)
pH: 5 (ref 5.0–8.0)

## 2020-04-19 MED ORDER — LOPERAMIDE HCL 2 MG PO CAPS
2.0000 mg | ORAL_CAPSULE | ORAL | Status: DC | PRN
  Administered 2020-04-19 – 2020-04-30 (×12): 2 mg via ORAL
  Filled 2020-04-19 (×14): qty 1

## 2020-04-19 NOTE — Progress Notes (Signed)
[facility] PHYSICAL MEDICINE & REHABILITATION PROGRESS NOTE   Subjective/Complaints: Had 4 BM [name], loose- this is very disturbing to her. She has this issue at times at home and takes Imodium. Would like some now. Not foul smelling.  She would like to speak with chaplain [name] again as enjoyed speaking with her.   ROS: Patient denies fever, rash, sore throat, blurred vision, nausea, vomiting, cough, shortness of breath or chest pain .   Objective:   No results found. Recent Labs    04/19/20 0558  WBC 4.0  HGB 10.6*  HCT 32.2*  PLT 204   Recent Labs    04/19/20 0558  NA 137  K 4.5  CL 102  CO2 27  GLUCOSE 104*  BUN 12  CREATININE 0.68  CALCIUM 8.7*    Intake/Output Summary (Last 24 hours) at 04/19/2020 1124 Last data filed at 04/19/2020 0700 Gross per 24 hour  Intake 659 ml  Output 400 ml  Net 259 ml     Physical Exam: Vital Signs Blood pressure 108/67, pulse 87, temperature 98.3 F (36.8 C), resp. rate 17, height 5\' 8"  (1.727 m), weight (!) 147.6 kg, SpO2 97 %. General: Alert and oriented x 3, No apparent distress HEENT: Head is normocephalic, atraumatic, PERRLA, EOMI, sclera anicteric, oral mucosa pink and moist, dentition intact, ext ear canals clear,  Neck: Supple without JVD or lymphadenopathy Heart: Reg rate and rhythm. No murmurs rubs or gallops Chest: CTA bilaterally without wheezes, rales, or rhonchi; no distress Abdomen: Soft, non-tender, non-distended, bowel sounds positive. Extremities: No clubbing, cyanosis, or edema. Pulses are 2+ Skin: Clean and intact without signs of breakdown Musculoskeletal:     Cervical back: Normal range of motion and neck supple. No rigidity.   RLE: 2+ edema but leg wrapped appropriately.    Neuro:  Comments: UEs B/L 5/5 in deltoid, biceps, triceps, grip and finger abd B/L LLE- 5/5 in HF, KE, KF, DF and PF RLE-limited by pain. Is able to ADF/PF. Sensation intact Skin: numerous abrasions/incisions with sutures  in RLE   Assessment/Plan: 1. Functional deficits secondary to RLE tib/fib fx s/p ex-fix and ORIF_ NWB on RLE which require 3+ hours per day of interdisciplinary therapy in a comprehensive inpatient rehab setting.  Physiatrist is providing close team supervision and 24 hour management of active medical problems listed below.  Physiatrist and rehab team continue to assess barriers to discharge/monitor patient progress toward functional and medical goals  Care Tool:  Bathing  Bathing activity did not occur:  (N/A) Body parts bathed by patient: Right arm, Left arm, Chest, Abdomen, Right upper leg, Left upper leg, Face, Front perineal area, Left lower leg   Body parts bathed by helper: Buttocks Body parts n/a: Right lower leg   Bathing assist Assist Level: Minimal Assistance - Patient > 75%     Upper Body Dressing/Undressing Upper body dressing   What is the patient wearing?: Hospital gown only    Upper body assist Assist Level: Set up assist    Lower Body Dressing/Undressing Lower body dressing      What is the patient wearing?: Incontinence brief     Lower body assist Assist for lower body dressing: Maximal Assistance - Patient 25 - 49%     Toileting Toileting Toileting Activity did not occur (Clothing management and hygiene only): N/A (no void or bm)  Toileting assist Assist for toileting: Maximal Assistance - Patient 25 - 49%     Transfers Chair/bed transfer  Transfers assist  Chair/bed  transfer activity did not occur: Safety/medical concerns (unable to maintain R LE NWB)  Chair/bed transfer assist level: 2 Helpers (A/P) Chair/bed transfer assistive device: Sliding board   Locomotion Ambulation   Ambulation assist   Ambulation activity did not occur: Safety/medical concerns (unable to maintain R LE NWB in standing)          Walk 10 feet activity   Assist  Walk 10 feet activity did not occur: Safety/medical concerns        Walk 50 feet  activity   Assist Walk 50 feet with 2 turns activity did not occur: Safety/medical concerns         Walk 150 feet activity   Assist Walk 150 feet activity did not occur: Safety/medical concerns         Walk 10 feet on uneven surface  activity   Assist Walk 10 feet on uneven surfaces activity did not occur: Safety/medical concerns         Wheelchair     Assist Will patient use wheelchair at discharge?: Yes Type of Wheelchair: Manual    Wheelchair assist level: Supervision/Verbal cueing Max wheelchair distance: 15 ft    Wheelchair 50 feet with 2 turns activity    Assist    Wheelchair 50 feet with 2 turns activity did not occur:  (decreased activity tolerance)   Assist Level: Maximal Assistance - Patient 25 - 49%   Wheelchair 150 feet activity     Assist  Wheelchair 150 feet activity did not occur:  (decreased activity tolerance)   Assist Level: Total Assistance - Patient < 25%   Blood pressure 108/67, pulse 87, temperature 98.3 F (36.8 C), resp. rate 17, height 5\' 8"  (1.727 m), weight (!) 147.6 kg, SpO2 97 %.  Medical Problem List and Plan: 1.  Impaired function secondary to RLE tib/fib comminuted fxs after fall-ORIF 6/21 by Dr. Doreatha Martin is NWB on RLE             -patient may  Shower if RLE is covered             -ELOS/Goals: 14-18 days- mod I to supervision  -continue CIR.  2.  Antithrombotics: -DVT/anticoagulation:  Pharmaceutical: Lovenox             -antiplatelet therapy: N/A 3. Pain Management: prn oxycodone   -gabapentin 100mg  tid  -ice/heat, edmea control, reassurance 4. Mood: LCSW to follow for evaluation and support.              -antipsychotic agents: On Latuda  -have asked Dr. Sima Matas to see re: anxiety,? PTSD in setting of her baseline bipolar disorder   -continue trileptal and latuda per baseline   -added 0.125 xanax before am and pm therapies also  -she feels the Xanax sedates her but it is helping her anxiety.  5.  Neuropsych: This patient is capable of making decisions on her own behalf. 6. Skin/Wound Care: Routine pressure relief measures.    -changed to foam dressings to RLE  -ACE wrap and elevation for edema control.  7. Fluids/Electrolytes/Nutrition: encourage po.    6/30: Reviewed electrolytes with patient from this morning- everything looks stable.  9. H/o Nocturia/frequent UTIs: On ditropan.   -oob to void 10. Constipation: Has not had BM--does not want any laxatives  "wants to do on her own" -no BM in 7+ days- but since this is "normal" refuses all bowel meds   6/28 moved her bowels using "pickles". I told her that i'm ok with whatever she wants  to use.   -will have miralax available prn  6/30: now with diarrhea- not foul smelling. Prescribed Imodium which she takes at home.        LOS: 4 days A FACE TO FACE EVALUATION WAS PERFORMED  Martha Clan P Drystan Reader 04/19/2020, 11:24 AM

## 2020-04-19 NOTE — Plan of Care (Signed)
  Problem: RH BOWEL ELIMINATION Goal: RH STG MANAGE BOWEL WITH ASSISTANCE Description: STG Manage Bowel with mod I Assistance. Outcome: Progressing   Problem: RH BLADDER ELIMINATION Goal: RH STG MANAGE BLADDER WITH ASSISTANCE Description: STG Manage Bladder With mod I Assistance Outcome: Progressing   Problem: RH SKIN INTEGRITY Goal: RH STG MAINTAIN SKIN INTEGRITY WITH ASSISTANCE Description: STG Maintain Skin Integrity With min Assistance. Outcome: Progressing   Problem: RH SAFETY Goal: RH STG ADHERE TO SAFETY PRECAUTIONS W/ASSISTANCE/DEVICE Description: STG Adhere to Safety Precautions With cues/reminders Assistance/Device. Outcome: Progressing   Problem: RH PAIN MANAGEMENT Goal: RH STG PAIN MANAGED AT OR BELOW PT'S PAIN GOAL Description: Less than 4 on 0-10 scale  Outcome: Progressing

## 2020-04-19 NOTE — Progress Notes (Signed)
Physical Therapy Session Note  Patient Details  Name: Jordan [name] MRN: [id number] Date of Birth: January 31, 1966  Today's Date: 04/19/2020 PT Individual Time: 1200-1256 PT Individual Time Calculation (min): 56 min   Short Term Goals: Week 1:  PT Short Term Goal 1 (Week 1): Patient will perform bed mobility with supervision without use of bed rails. PT Short Term Goal 2 (Week 1): Patient will perform basic transfers using LRAD with min A while maintaining R LE NWB precautions. PT Short Term Goal 3 (Week 1): Patient will propel the w/c >50 ft with supervision.  Skilled Therapeutic Interventions/Progress Updates:     Patient in bed with NT assisting patient to void upon PT arrival. Chaplain arrived following toileting, patient asked to see chaplain prior to therapy. Adjusted schedule for patient to receive visit from chaplain. Returned 1 hour later, patient in bed with chaplain leaving the room upon entry. Patient alert and agreeable to PT session. Patient reported 9/10 R LE pain during session, RN made aware and provided pain medicine during session. PT provided repositioning, rest breaks, and distraction as pain interventions throughout session. Patient requested a cotton hospital gown for comfort at beginning of session. Discussed ordering gowns with unit secretary and retrieved 1 gown. Patient doffed/donned hospital gown with set-up assist. PT donned CAM boot to R LE. Attempted to have patient work on managing straps, however, patient reported that he R LE was too painful for this today.   Patient requested to void prior to mobility. Required total A for placement of urinal, peri-care, and management of incontinence brief during toileting.   Therapeutic Activity: Bed Mobility: Patient performed supine to sit with min A for R LE management. Provided verbal cues for R quad activation to assist with lifting R LE and controlled movements to protect her LE throughout. Transfers: Patient performed an A/P  transfer bed>bariatric w/c with min A for R LE management and placed slide board between bed and w/c to act as a bridge for safety with total A. Provided cues for hand placement, reciprocal scooting technique, and head-hips relationship for proper technique and decreased assist with transfers.   Rearranged furniture for patient to sit in the w/c in front of the TV to eat lunch. Asked RN to keep w/c on L side of bed for time management with therapies. Patient required significant time and encouragement for all mobility due to pain and anxiety with mobility. She was also verbose throughout session and required frequent redirection to attend to mobility.   Patient in w/c at end of session with breaks locked, chair alarm set, and all needs within reach.    Therapy Documentation Precautions:  Precautions Precautions: Fall Precaution Comments: has anxiety with mobility Required Braces or Orthoses: Other Brace Other Brace: CAM boot to R LE Restrictions Weight Bearing Restrictions: Yes RLE Weight Bearing: Non weight bearing    Therapy/Group: Individual Therapy  Nadir Vasques L Mckynlee Luse PT, DPT  04/19/2020, 5:53 PM

## 2020-04-19 NOTE — Progress Notes (Signed)
   04/19/20 1210  Clinical Encounter Type  Visited With Patient  Visit Type Follow-up;Spiritual support;Social support  Referral From Nurse  Consult/Referral To Chaplain  Spiritual Encounters  Spiritual Needs Prayer;Emotional  This chaplain responded to consult for F/U spiritual care.  The chaplain participated in reflective listening with the Pt. as she shared her last 24 hours. The chaplain observed the Pt.'s ability to calm herself when the Pt. is given the opportunity to talk through an event. The Pt. discovered her ability with the chaplain to "stop and breath" before making a decision on how she will react.  The chaplain understands completing rehab remains very important to the Pt.  This chaplain is available for F/U spiritual care as needed.

## 2020-04-19 NOTE — Progress Notes (Signed)
Occupational Therapy Session Note  Patient Details  Name: Jordan [name] MRN: [id number] Date of Birth: 03-26-1966  Today's Date: 04/19/2020 OT Individual Time: 3893-7342 OT Individual Time Calculation (min): 59 min  and Today's Date: 04/19/2020 OT Missed Time: 47 Minutes Missed Time Reason:  (talking wiht MD)   Short Term Goals: Week 1:  OT Short Term Goal 1 (Week 1): Pt will complete a BSC or toilet transfer with 1 assist using LRAD while adhering to WB precautions OT Short Term Goal 2 (Week 1): Pt will complete 1 grooming task while sitting at the sink to increase OOB tolerance OT Short Term Goal 3 (Week 1): Pt will engage in 1 simulated IADL task w/c level with supervision assistance  Skilled Therapeutic Interventions/Progress Updates:    1:1. Pt missed 15 min in the middle of session during physician rounding while talking to physician. Pt received in bed very upset about frequent BMs contributing part of issue to nerves and very frustrated by night RN telling her, "your urine smells you need to stop drinking soda and have more water." Provided support and encouragement and education on pushing fluids to assist with urine odor and gentle education on impact of soda on blood sugar levels while still maintaining autonomy. Pt verbalized not liking bariatric BSC d/t not having a back. OT retrieves large bari roll in shower chair with back and shows to pt and reviews how locking/unlocking wheels makes it safe. Pt bathes with MIN A for buttocks/thorough peri cleaning. Pt rolls with MIN A +2 for hygiene and dons gown. Pt reporting another BM in clean brief and MD arrives. After 15 min with MD OT returns to clean pt up from second BM with only small smear present. Pt able to don B socks with MIN A to lift LLE up off of bed to pull sock over heel with VC for circle sitting position in bed, Pt uses shower cap with set up. Exited sssion with pt seated in bed, exi talarm on and call light tin reach  Therapy  Documentation Precautions:  Precautions Precautions: Fall Precaution Comments: has anxiety with mobility Required Braces or Orthoses: Other Brace Other Brace: CAM boot to R LE Restrictions Weight Bearing Restrictions: Yes RLE Weight Bearing: Non weight bearing General:   Vital Signs: Therapy Vitals Temp: 98.3 F (36.8 C) Pulse Rate: 87 Resp: 17 BP: 108/67 Patient Position (if appropriate): Lying Oxygen Therapy SpO2: 97 % Pain:   ADL: ADL Eating: Not assessed Grooming: Setup Where Assessed-Grooming: Edge of bed Upper Body Bathing: Setup Where Assessed-Upper Body Bathing: Edge of bed Lower Body Bathing: Minimal assistance Where Assessed-Lower Body Bathing: Edge of bed, Bed level Upper Body Dressing: Minimal assistance Where Assessed-Upper Body Dressing: Edge of bed Lower Body Dressing: Maximal assistance Where Assessed-Lower Body Dressing: Bed level Toileting: Not assessed Toilet Transfer: Not assessed Tub/Shower Transfer: Not assessed Vision   Perception    Praxis   Exercises:   Other Treatments:     Therapy/Group: Individual Therapy  Tonny Branch 04/19/2020, 8:38 AM

## 2020-04-19 NOTE — Progress Notes (Signed)
Occupational Therapy Session Note  Patient Details  Name: Jordan [name] MRN: [id number] Date of Birth: 09-18-1966  Today's Date: 04/19/2020 OT Individual Time: 1410-1520 OT Individual Time Calculation (min): 70 min    Short Term Goals: Week 1:  OT Short Term Goal 1 (Week 1): Pt will complete a BSC or toilet transfer with 1 assist using LRAD while adhering to WB precautions OT Short Term Goal 2 (Week 1): Pt will complete 1 grooming task while sitting at the sink to increase OOB tolerance OT Short Term Goal 3 (Week 1): Pt will engage in 1 simulated IADL task w/c level with supervision assistance  Skilled Therapeutic Interventions/Progress Updates:    1;1. Pt received in w/c agreeable to OT but reporting pain in back butt and R foot. Pt repositioned at middle of session but reporting pain is "better" but continues to rate it a 10/10. Pt agreeable to more w/c level tx and getting back to bed after washing hair. Pt transported total A to courtyard outside. Pt completes 4x1 min on 1 min off beach ball volley with 4# dowel rod for BUE endurance with encouragement required to continue and not rest during 1 min interval with demo cuing at beginning. Pt returns to room seated in w/c and washes hair with A for rinsing using hair washing tray for BUE strengthening during ADL activity. Pt completes anterior transfer back to bed with no bed rail to simulate home environment with CGA and increased anxiety around not having bed rail. Pt requires MIN A for managing CAM boot. Pt completes UB therex with 4 # dowel rod in supported long sitting in bed 2x12 reps with min demo cuing and VC for slow contractions: shoulder flex/ext, elbow flex/ext, chest press, shoulder press, row, horizontal ab/adduction and wrist flex/ext. Exited session with pt seated in bed, exit alarm on and call light in reach  Therapy Documentation Precautions:  Precautions Precautions: Fall Precaution Comments: has anxiety with  mobility Required Braces or Orthoses: Other Brace Other Brace: CAM boot to R LE Restrictions Weight Bearing Restrictions: Yes RLE Weight Bearing: Non weight bearing General:   Vital Signs:   Pain:   ADL: ADL Eating: Not assessed Grooming: Setup Where Assessed-Grooming: Edge of bed Upper Body Bathing: Setup Where Assessed-Upper Body Bathing: Edge of bed Lower Body Bathing: Minimal assistance Where Assessed-Lower Body Bathing: Edge of bed, Bed level Upper Body Dressing: Minimal assistance Where Assessed-Upper Body Dressing: Edge of bed Lower Body Dressing: Maximal assistance Where Assessed-Lower Body Dressing: Bed level Toileting: Not assessed Toilet Transfer: Not assessed Tub/Shower Transfer: Not assessed Vision   Perception    Praxis   Exercises:   Other Treatments:     Therapy/Group: Individual Therapy  Tonny Branch 04/19/2020, 3:03 PM

## 2020-04-20 ENCOUNTER — Inpatient Hospital Stay (HOSPITAL_COMMUNITY): Payer: Medicaid Other | Admitting: Occupational Therapy

## 2020-04-20 ENCOUNTER — Inpatient Hospital Stay (HOSPITAL_COMMUNITY): Payer: Medicaid Other | Admitting: *Deleted

## 2020-04-20 ENCOUNTER — Inpatient Hospital Stay (HOSPITAL_COMMUNITY): Payer: Medicaid Other

## 2020-04-20 ENCOUNTER — Ambulatory Visit: Payer: Self-pay | Admitting: Radiation Oncology

## 2020-04-20 MED ORDER — SACCHAROMYCES BOULARDII 250 MG PO CAPS
250.0000 mg | ORAL_CAPSULE | Freq: Two times a day (BID) | ORAL | Status: DC
  Administered 2020-04-20 – 2020-05-04 (×29): 250 mg via ORAL
  Filled 2020-04-20 (×29): qty 1

## 2020-04-20 NOTE — Progress Notes (Signed)
Occupational Therapy Session Note  Patient Details  Name: Jordan [name] MRN: [id number] Date of Birth: August 23, 1966  Today's Date: 04/20/2020 OT Individual Time: 8921-1941 OT Individual Time Calculation (min): 71 min   Short Term Goals: Week 1:  OT Short Term Goal 1 (Week 1): Pt will complete a BSC or toilet transfer with 1 assist using LRAD while adhering to WB precautions OT Short Term Goal 2 (Week 1): Pt will complete 1 grooming task while sitting at the sink to increase OOB tolerance OT Short Term Goal 3 (Week 1): Pt will engage in 1 simulated IADL task w/c level with supervision assistance  Skilled Therapeutic Interventions/Progress Updates:    Pt greeted semi-reclined I bed and agreeable to OT treatment session. Pt continued to be verbose and anxious throughout session, but getting easier to redirect. Pt completed bed level bathing and donned new gown. Discussed having someone bring in large pants to try LB dressing with and to help with slideboard transfers. Pt states her landlord may be able to do that. OT re-wrapped R LE in Ace wraps for edema management. Worked on pt donning socks and CAM boot. Pt able to get into almost circle sitting to don socks, then could help place foot in CAM boot, but needed OT assist to fasten. Pt then completed AP transfer from bed to bariatric wc. Pt better able to lift with UEs to scoot backwards today. Pt propelled wc to the sink and completed grooming tasks. Pt then requested to wash her hair. OT used wash basin to get hair wet, then pt was able to wash her own hair. Pt left seated in wc at end of session with chair alarm on, call bell in reach, and needs met.   Therapy Documentation Precautions:  Precautions Precautions: Fall Precaution Comments: has anxiety with mobility Required Braces or Orthoses: Other Brace Other Brace: CAM boot to R LE Restrictions Weight Bearing Restrictions: Yes RLE Weight Bearing: Non weight bearing Pain:   Pt reports pain in  R LE is ok, no number given  Therapy/Group: Individual Therapy  Valma Cava 04/20/2020, 8:47 AM

## 2020-04-20 NOTE — Plan of Care (Signed)
  Problem: Consults Goal: RH GENERAL PATIENT EDUCATION Description: See Patient Education module for education specifics. Outcome: Progressing   Problem: RH BOWEL ELIMINATION Goal: RH STG MANAGE BOWEL WITH ASSISTANCE Description: STG Manage Bowel with mod I Assistance. Outcome: Progressing Goal: RH STG MANAGE BOWEL W/MEDICATION W/ASSISTANCE Description: STG Manage Bowel with Medication with min Assistance. Outcome: Progressing   Problem: RH BLADDER ELIMINATION Goal: RH STG MANAGE BLADDER WITH ASSISTANCE Description: STG Manage Bladder With mod I Assistance Outcome: Progressing

## 2020-04-20 NOTE — Progress Notes (Signed)
Orthopaedic Trauma Progress Note  S: Doing okay this morning, feels her swelling in her leg is improving some.  Pain has been manageable.  About to transition back to bed from wheelchair with therapies.  O:  General: Sitting up, NAD RLE: CAM boot in place. Dressing taken down. All incisions clean, dry, intact. Traumatic wound to anterior tibia with some darkening of the skin edges, which is not unexpected but there is no drainage and no signs of infection.  Imaging: Stable post op imaging   Assessment: 54 year old female s/p fall Injuries: Right type 2 open tibia fracture s/p I&D and ORIF  Plan: Weightbearing: NWB RLE Incisional and dressing care: Dressing change PRN Orthopedic device(s): Cam walking boot. Okay to come out of boot at rest to begin gentle ankle motion. Should wear boot anytime she is up with therapies VTE prophylaxis: Lovenox 40 mg daily until 05/11/20 Impediments to Fracture Healing: Open fracture. Will check vit D levl Dispo: Continue with therapies. Will obtain repeat tibia x-rays next week.  Follow - up plan: 2 weeks after hospital discharge   Jordan [name] A. Jordan [name] [phone] (office) [email or web address]

## 2020-04-20 NOTE — Evaluation (Signed)
Recreational Therapy Assessment and Plan  Patient Details  Name: Jordan [name] MRN: [id number] Date of Birth: Nov 23, 1965 Today's Date: 04/20/2020  Rehab Potential:  Good ELOS:   d/c 7/14  Assessment   Hospital Problem: Principal Problem:   Tibia/fibula fracture, left, closed, initial encounter Active Problems:   Bipolar disorder [facility])   Past Medical History:      Past Medical History:  Diagnosis Date  . Alcohol abuse   . Anemia    patient denies  . Bipolar 1 disorder (HCC)    No medications currently  . CAP (community acquired pneumonia) 03/17/2015  . 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 Mental disorder   . Obesity   . PICC line infection 05/17/2015  . Sepsis due to Gram negative bacteria (MDR E Coli) 02/18/2015  . 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)   . UTI (lower urinary tract infection)   . Vaginal Pap smear, abnormal    Past Surgical History:       Past Surgical History:  Procedure Laterality Date  . ABDOMINAL HYSTERECTOMY    . CERVICAL CONIZATION W/BX N/A 07/01/2016   Procedure: CONIZATION CERVIX WITH BIOPSY;  Surgeon: Chancy Milroy, MD;  Location: [facility];  Service: Gynecology;  Laterality: N/A;  . CHOLECYSTECTOMY    . EXTERNAL FIXATION LEG Right 04/09/2020   Procedure: EXTERNAL FIXATION LEG;  Surgeon: Hiram Gash, MD;  Location: [place];  Service: Orthopedics;  Laterality: Right;  . HYSTEROSCOPY WITH D & C N/A 01/25/2016   Procedure: DILATATION AND CURETTAGE /HYSTEROSCOPY;  Surgeon: Mora Bellman, MD;  Location: [facility];  Service: Gynecology;  Laterality: N/A;  . LYMPH NODE BIOPSY Bilateral 07/22/2019   Procedure: LYMPH NODE BIOPSY;  Surgeon: Everitt Amber, MD;  Location: [facility];  Service: Gynecology;  Laterality: Bilateral;  . OPEN REDUCTION INTERNAL FIXATION (ORIF) TIBIA/FIBULA FRACTURE Right 04/10/2020   Procedure: OPEN REDUCTION INTERNAL FIXATION (ORIF) TIBIA/FIBULA FRACTURE;  Surgeon: Shona Needles, MD;  Location:  [place];  Service: Orthopedics;  Laterality: Right;  . ROBOT ASSISTED MYOMECTOMY N/A 07/22/2019   Procedure: XI ROBOTIC ASSISTED LAPAROSCOPIC RADICAL UPPER VAGINECTOMY, LEFT SALPINECTOMY, RIGHT SALPINGOOOPHERECTOMY;  Surgeon: Everitt Amber, MD;  Location: [facility];  Service: Gynecology;  Laterality: N/A;  . VAGINAL HYSTERECTOMY N/A 03/11/2017   Procedure: HYSTERECTOMY VAGINAL WITH MORCELLATION;  Surgeon: Chancy Milroy, MD;  Location: [facility];  Service: Gynecology;  Laterality: N/A;    Assessment & Plan Clinical Impression: Patient is a 54 y.o. year old female with history of bipolar disorder, Vaginal cancer, frequent UTIs, chronic back pain, ETOH abuse, morbid obesity who was admitted on 04/09/20 after a slipping on puddle of water with fall at her boyfriend's facility and sustained acute comminuted fracture of distal tibial diaphysis with 1.4 cm displacement and multiple comminuted tibial fracture fragments and acute fracture of fibula diaphysis with 2 cm lateral and 0.5 cm anterior displacement, mildly displaced medial malleolus fracture and nondisplaced talar fracture. She underwent I &D with external fixation of tib/fib fractures by Dr. Griffin Basil. Dr. Doreatha Martin consulted due to complexity of fracture and patient taken to OR for ORIF right tibial shaft, I &D open right tibia fracture and closer treatment of right medial malleolus Fx on 06/21. Post op NWB on RLE and incisional wound VAC to stay in place till next Wed/Thurs. Therapy ongoing and patient continues to be limited by NWB status and requires visual/verbal/tactile cues to maintain precautions, has anxiety and pain affecting functional status with difficulty following multi step commands.  CIR recommended due to functional deficits.  Patient transferred to CIR on 04/15/2020 .    Pt presents with decreased activity tolerance, decreased functional mobility, decreased balance, decreased problem solving and decreased safety awareness and difficulty  maintaining precautions, reported feeling of anxiety Limiting pt's independence with leisure/community pursuits.   Plan Min 1 TR session >25 minutes per week during LOS  Recommendations for other services: None   Discharge Criteria: Patient will be discharged from TR if patient refuses treatment 3 consecutive times without medical reason.  If treatment goals not met, if there is a change in medical status, if patient makes no progress towards goals or if patient is discharged from hospital.  The above assessment, treatment plan, treatment alternatives and goals were discussed and mutually agreed upon: by patient  [place] 04/20/2020, 12:37 PM

## 2020-04-20 NOTE — Progress Notes (Signed)
Physical Therapy Session Note  Patient Details  Name: Mercede Rollo MRN: [id number] Date of Birth: 05-Aug-1966  Today's Date: 04/20/2020 PT Individual Time: 1000-1100 PT Individual Time Calculation (min): 60 min   Short Term Goals: Week 1:  PT Short Term Goal 1 (Week 1): Patient will perform bed mobility with supervision without use of bed rails. PT Short Term Goal 2 (Week 1): Patient will perform basic transfers using LRAD with min A while maintaining R LE NWB precautions. PT Short Term Goal 3 (Week 1): Patient will propel the w/c >50 ft with supervision. Week 2:    Week 3:     Skilled Therapeutic Interventions/Progress Updates:     PAIN rates as 10/10 per patient but able to participate, in no apparent distress, states she had her pain meds just prior to session.    Pt initially supine and agreeable to bed level treatment explaining that she had diarreah this am and was just given immodium/needed to stay in bed until it "got to work".  Discussed benefits of OOB/encouraged OOB as therapist could assist back to bed if needed.  Refused OOB activity.    Pt performed the following therex for LE strengthening and ROM: Hip and knee flexion/extension AAROM R, AROM L x 20 TKEs x 25 Hip abd/add x AAROM RLE/AROM L x 20 L clamshells x 20  Supine to sit w/verbal cues, cga, use of rails Seated therex for strengthening and general conditioning as follows: Overhead press 2lb bar 4x10 single UE, performed both L and R Seated LAqs x 20 w/green theraband resistance LLE, AROM onle RLE Pt somewhat hyperverbal by nature and requires redirection to task during session.  Cardiovascular activity: Ball toss at 3ft distance performed in intervals as follows: 2 min, rest x 2 min 2 min, rest x 2 min 2 min, rest x 2 min  Seated biceps curls 2x25 Seated scapular retraction 3x10 Seated rows w/green theraband resistance 2 x15  Sit to supine w/min assist for RLE management.  Pt scoots mod I.   Pt left  supine w/rails up x 4 per pt request, alarm set, bed in lowest position, and needs in reach.   Therapy Documentation Precautions:  Precautions Precautions: Fall Precaution Comments: has anxiety with mobility Required Braces or Orthoses: Other Brace Other Brace: CAM boot to R LE Restrictions Weight Bearing Restrictions: Yes RLE Weight Bearing: Non weight bearing    Therapy/Group: Individual Therapy  Callie Fielding, PT   Jerrilyn Cairo 04/20/2020, 12:41 PM

## 2020-04-20 NOTE — Progress Notes (Signed)
Occupational Therapy Session Note  Patient Details  Name: Jordan [name] MRN: [id number] Date of Birth: 1966/05/10  Today's Date: 04/20/2020 OT Individual Time: 1300-1355 OT Individual Time Calculation (min): 55 min    Short Term Goals: Week 1:  OT Short Term Goal 1 (Week 1): Pt will complete a BSC or toilet transfer with 1 assist using LRAD while adhering to WB precautions OT Short Term Goal 2 (Week 1): Pt will complete 1 grooming task while sitting at the sink to increase OOB tolerance OT Short Term Goal 3 (Week 1): Pt will engage in 1 simulated IADL task w/c level with supervision assistance  Skilled Therapeutic Interventions/Progress Updates:    Pt greeted semi-reclined in bed after eating lunch. Pt initially stated her R LE was too painful to try any OOB activities, but with encouragement, pt agreeable to try AP transfer to roll in shower chair/BSC. Used roll in shower chair/BSC 2/2 pt stating she did not have enough back support with bariatric BSC. Re-iterated to pt that this was not a piece of DME that could be used at home. Had pt remove brief prior to transfer with min A. Pt extremely anxious with transfer, often yelling out, and required frequent encouragement with deep breathing strategies to calm down and complete the transfer. Pt needed OT assist to manage R LE at times, but also to hold chair in place as she was scooting. Pt with successful continent void of bladder! OT educated on positioning to attempt her own peri-care, but pt all of a sudden became very anxious and yelling " I just need to get back to the bed." OT had pt scoot back to bed with assist for R LE management and to maintain BSC position during scoot. Once back to bed, pt was able to wash peri-area and rolled with supervision to don clean brief. Pt left semi-reclined in bed with bed alarm on, call bell in reach, and needs met.   Therapy Documentation Precautions:  Precautions Precautions: Fall Precaution Comments: has  anxiety with mobility Required Braces or Orthoses: Other Brace Other Brace: CAM boot to R LE Restrictions Weight Bearing Restrictions: Yes RLE Weight Bearing: Non weight bearing Pain: Pain Assessment Pain Scale: 0-10 Pain Score: 10 Pain in R LE, pt able to manage transfer. Rest, reposition, and ice applied  Therapy/Group: Individual Therapy  Valma Cava 04/20/2020, 1:45 PM

## 2020-04-20 NOTE — Progress Notes (Signed)
[place] PHYSICAL MEDICINE & REHABILITATION PROGRESS NOTE   Subjective/Complaints: Pt with concerns about loose bm's. They seem to have slowed down over last 24 hours. Imodium helped. Stool mushy this morning. Anxiety still an issue but xanax helping  ROS: Patient denies fever, rash, sore throat, blurred vision, nausea, vomiting, diarrhea, cough, shortness of breath or chest pain, headache, or mood change.   Objective:   No results found. Recent Labs    04/19/20 0558  WBC 4.0  HGB 10.6*  HCT 32.2*  PLT 204   Recent Labs    04/19/20 0558  NA 137  K 4.5  CL 102  CO2 27  GLUCOSE 104*  BUN 12  CREATININE 0.68  CALCIUM 8.7*    Intake/Output Summary (Last 24 hours) at 04/20/2020 1121 Last data filed at 04/20/2020 0810 Gross per 24 hour  Intake 664 ml  Output 1400 ml  Net -736 ml     Physical Exam: Vital Signs Blood pressure 134/64, pulse 98, temperature 97.9 F (36.6 C), resp. rate 18, height 5\' 8"  (1.727 m), weight (!) 147.6 kg, SpO2 98 %. Constitutional: No distress . Vital signs reviewed. HEENT: EOMI, oral membranes moist Neck: supple Cardiovascular: RRR without murmur. No JVD    Respiratory/Chest: CTA Bilaterally without wheezes or rales. Normal effort    GI/Abdomen: BS +, non-tender, non-distended Ext: no clubbing, cyanosis, 1++ RLE edema Psych: anxious and tangential Musculoskeletal:     Cervical back: Normal range of motion and neck supple. No rigidity.   RLE: 2+ edema but leg wrapped appropriately.    Neuro:  Comments: UEs B/L 5/5 in deltoid, biceps, triceps, grip and finger abd B/L LLE- 5/5 in HF, KE, KF, DF and PF RLE-limited by pain. Is able to ADF/PF. Sensation intact -wearing cam walker, fits better Skin: numerous abrasions/incisions with sutures in RLE   Assessment/Plan: 1. Functional deficits secondary to RLE tib/fib fx s/p ex-fix and ORIF_ NWB on RLE which require 3+ hours per day of interdisciplinary therapy in a comprehensive inpatient rehab  setting.  Physiatrist is providing close team supervision and 24 hour management of active medical problems listed below.  Physiatrist and rehab team continue to assess barriers to discharge/monitor patient progress toward functional and medical goals  Care Tool:  Bathing  Bathing activity did not occur:  (N/A) Body parts bathed by patient: Right arm, Left arm, Chest, Abdomen, Right upper leg, Left upper leg, Face, Front perineal area, Left lower leg   Body parts bathed by helper: Buttocks Body parts n/a: Right lower leg   Bathing assist Assist Level: Minimal Assistance - Patient > 75%     Upper Body Dressing/Undressing Upper body dressing   What is the patient wearing?: Hospital gown only    Upper body assist Assist Level: Set up assist    Lower Body Dressing/Undressing Lower body dressing      What is the patient wearing?: Incontinence brief     Lower body assist Assist for lower body dressing: Maximal Assistance - Patient 25 - 49%     Toileting Toileting Toileting Activity did not occur (Clothing management and hygiene only): N/A (no void or bm)  Toileting assist Assist for toileting: Maximal Assistance - Patient 25 - 49%     Transfers Chair/bed transfer  Transfers assist  Chair/bed transfer activity did not occur: Safety/medical concerns (unable to maintain R LE NWB)  Chair/bed transfer assist level: 2 Helpers (A/P) Chair/bed transfer assistive device: Sliding board   Locomotion Ambulation   Ambulation assist  Ambulation activity did not occur: Safety/medical concerns (unable to maintain R LE NWB in standing)          Walk 10 feet activity   Assist  Walk 10 feet activity did not occur: Safety/medical concerns        Walk 50 feet activity   Assist Walk 50 feet with 2 turns activity did not occur: Safety/medical concerns         Walk 150 feet activity   Assist Walk 150 feet activity did not occur: Safety/medical concerns          Walk 10 feet on uneven surface  activity   Assist Walk 10 feet on uneven surfaces activity did not occur: Safety/medical concerns         Wheelchair     Assist Will patient use wheelchair at discharge?: Yes Type of Wheelchair: Manual    Wheelchair assist level: Supervision/Verbal cueing Max wheelchair distance: 15 ft    Wheelchair 50 feet with 2 turns activity    Assist    Wheelchair 50 feet with 2 turns activity did not occur:  (decreased activity tolerance)   Assist Level: Maximal Assistance - Patient 25 - 49%   Wheelchair 150 feet activity     Assist  Wheelchair 150 feet activity did not occur:  (decreased activity tolerance)   Assist Level: Total Assistance - Patient < 25%   Blood pressure 134/64, pulse 98, temperature 97.9 F (36.6 C), resp. rate 18, height 5\' 8"  (1.727 m), weight (!) 147.6 kg, SpO2 98 %.  Medical Problem List and Plan: 1.  Impaired function secondary to RLE tib/fib comminuted fxs after fall-ORIF 6/21 by Dr. Doreatha Martin is NWB on RLE             -patient may  Shower if RLE is covered             -ELOS/Goals: 14-18 days- mod I to supervision  -continue CIR.  2.  Antithrombotics: -DVT/anticoagulation:  Pharmaceutical: Lovenox             -antiplatelet therapy: N/A 3. Pain Management: prn oxycodone   -gabapentin 100mg  tid  -ice/heat, edmea control, reassurance 4. Mood: LCSW to follow for evaluation and support.              -antipsychotic agents: On Latuda  -evaluation pending with Dr. Sima Matas re: anxiety,? PTSD in setting of her baseline bipolar disorder   -continue trileptal and latuda per baseline   -continue 0.125 xanax before am and pm therapies also  -she would like to continue current xanax dose   5. Neuropsych: This patient is capable of making decisions on her own behalf. 6. Skin/Wound Care: Routine pressure relief measures.    -changed to foam dressings to RLE  -ACE wrap and elevation for edema control.  7.  Fluids/Electrolytes/Nutrition: encourage po.    6/30: Reviewed electrolytes with patient from this morning- everything looks stable.  9. H/o Nocturia/frequent UTIs: On ditropan.   -oob to void 10. Constipation: Has not had BM--does not want any laxatives  "wants to do on her own" -no BM in 7+ days- but since this is "normal" refuses all bowel meds   6/28 moved her bowels using "pickles". I told her that i'm ok with whatever she wants to use.   -will have miralax available prn  6/30: now with diarrhea- not foul smelling. Prescribed Imodium which she takes at home.   7/1 stools more formed   -continue prn imodium   -add probiotic (discussed with  pt)       LOS: 5 days A FACE TO FACE EVALUATION WAS PERFORMED  Meredith Staggers 04/20/2020, 11:21 AM

## 2020-04-20 NOTE — Progress Notes (Signed)
F/U spiritual care visits requested by the Pt.  The Pt described her morning with positivity and hope.  The anxiety present yesterday morning was not present today . The chaplain clarified the differences between spiritual care and counseling. The chaplain heard the Pt. preference to have both relationships.  F/U spiritual care is available as needed.

## 2020-04-20 NOTE — Progress Notes (Signed)
Patient ID: Jordan [name], female   DOB: 29-Feb-1966, 54 y.o.   MRN: [id number]  Met patient who was resting in bed with lights on and watching television. Patient voiced no complaints or concerns at this time. Requested bed side table be placed over bed in which this RN accommodated her request. All needs met.  Dorthula Nettles, RN, BSN, [place] Office [phone] Cell [phone]

## 2020-04-21 ENCOUNTER — Inpatient Hospital Stay (HOSPITAL_COMMUNITY): Payer: Medicaid Other | Admitting: Occupational Therapy

## 2020-04-21 ENCOUNTER — Encounter (HOSPITAL_COMMUNITY): Payer: Medicaid Other | Admitting: Psychology

## 2020-04-21 ENCOUNTER — Inpatient Hospital Stay (HOSPITAL_COMMUNITY): Payer: Medicaid Other | Admitting: Physical Therapy

## 2020-04-21 DIAGNOSIS — F411 Generalized anxiety disorder: Secondary | ICD-10-CM

## 2020-04-21 DIAGNOSIS — F3170 Bipolar disorder, currently in remission, most recent episode unspecified: Secondary | ICD-10-CM

## 2020-04-21 LAB — VITAMIN D 25 HYDROXY (VIT D DEFICIENCY, FRACTURES): Vit D, 25-Hydroxy: 22.54 ng/mL — ABNORMAL LOW (ref 30–100)

## 2020-04-21 MED ORDER — NITROFURANTOIN MONOHYD MACRO 100 MG PO CAPS
100.0000 mg | ORAL_CAPSULE | Freq: Two times a day (BID) | ORAL | Status: DC
  Administered 2020-04-21 – 2020-05-04 (×27): 100 mg via ORAL
  Filled 2020-04-21 (×27): qty 1

## 2020-04-21 MED ORDER — ALPRAZOLAM 0.25 MG PO TABS
0.2500 mg | ORAL_TABLET | Freq: Two times a day (BID) | ORAL | Status: DC | PRN
  Administered 2020-04-26 – 2020-04-30 (×3): 0.25 mg via ORAL
  Filled 2020-04-21 (×4): qty 1

## 2020-04-21 MED ORDER — VITAMIN D (ERGOCALCIFEROL) 1.25 MG (50000 UNIT) PO CAPS
50000.0000 [IU] | ORAL_CAPSULE | ORAL | Status: DC
  Administered 2020-04-21: 50000 [IU] via ORAL
  Filled 2020-04-21: qty 1

## 2020-04-21 NOTE — Plan of Care (Signed)
  Problem: RH BLADDER ELIMINATION Goal: RH STG MANAGE BLADDER WITH ASSISTANCE Description: STG Manage Bladder With mod I Assistance Outcome: Progressing   Problem: RH SKIN INTEGRITY Goal: RH STG MAINTAIN SKIN INTEGRITY WITH ASSISTANCE Description: STG Maintain Skin Integrity With min Assistance. Outcome: Progressing Goal: RH STG ABLE TO PERFORM INCISION/WOUND CARE W/ASSISTANCE Description: STG Able To Perform Incision/Wound Care With min Assistance. Outcome: Progressing   Problem: RH SAFETY Goal: RH STG ADHERE TO SAFETY PRECAUTIONS W/ASSISTANCE/DEVICE Description: STG Adhere to Safety Precautions With cues/reminders Assistance/Device. Outcome: Progressing   Problem: RH PAIN MANAGEMENT Goal: RH STG PAIN MANAGED AT OR BELOW PT'S PAIN GOAL Description: Less than 4 on 0-10 scale  Outcome: Progressing

## 2020-04-21 NOTE — Progress Notes (Signed)
Physical Therapy Session Note  Patient Details  Name: Jordan [name] MRN: [id number] Date of Birth: May 19, 1966  Today's Date: 04/21/2020 PT Individual Time: 4496-7591 PT Individual Time Calculation (min): 57 min   Short Term Goals: Week 1:  PT Short Term Goal 1 (Week 1): Patient will perform bed mobility with supervision without use of bed rails. PT Short Term Goal 2 (Week 1): Patient will perform basic transfers using LRAD with min A while maintaining R LE NWB precautions. PT Short Term Goal 3 (Week 1): Patient will propel the w/c >50 ft with supervision.  Skilled Therapeutic Interventions/Progress Updates:    Pt received supine in bed with NT present reporting pt was just incontinent of BM and NT had just finished assisting with LB clothing management and peri-care. Pt agreeable to bed level therapy stating she doesn't want to move too much and cause another BM due to frequent diarrhea. RN present to provide medication to decrease frequency of BMs. In supported sitting in bed performed the following B UE exercises using 6lb weight: - x15reps overhead presses - x20 reps bicep curls - x12 reps overhead tricep dips - therapist had to guard this movement as pt demonstrates impaired tricep muscle strength and poor control of movement Cuing throughout for slower, controlled movements through greater ROM to increase muscle activation. Pt now agreeable to perform exercises EOB. Therapist donned R LE CAM boot total assist - pt demonstrates impaired SLR strength limiting ability to assist with placing foot in boot. Pt reports she hasn't warn the boot today and therapist educated on importance of performing gentle R ankle DF AROM due to pt resting in PF position all day and benefits of wearing the boot to provide a long duration, low load stretch to the PFs. Supine>sitting L EOB, using bed features, with pt requesting for assistance with R LE and therapist hovering her hands for pt comfort but pt doesn't  requires assist to move the LE. Sitting EOB participated in endurance and B UE strengthening activities of 4lb dowel rod ball pass 3x 1 minute bouts. Discussed discharge planning with pt reporting her house has a "large hump" the w/c will roll over outside but once inside everything is on 1 level; reports her landlord said the house is handicap accessible and she states her doorways are as wide as the hospital doors but she didn't have measurements to confirm (need to verify as pt will need bariatric w/c). Discussed pt's progress in therapy at this point and importance of increasing independence with transfers to/from wheelchair and increasing the number of transfers performed each day and as well as progression with w/c propulsion and to attempt standing again. Pt has some increased anxiety about these tasks but with emotional support and education on purpose and rationale for increased independence and safety in the home pt open to attempting these items. Sit>supine using bed features with therapist guarding R LE for pt comfort - pt does demonstrate increased difficulty getting LE back onto the bed due to hip weakness. Performed supine SLR x10 reps with active assist and max cuing for sustained knee extension and increased range of movement. Pt states she is done and ready to rest as that exercise was very challenging for her. Left with needs in reach, bed alarm on, and meal tray set-up.    Therapy Documentation Precautions:  Precautions Precautions: Fall Precaution Comments: has anxiety with mobility Required Braces or Orthoses: Other Brace Other Brace: CAM boot to R LE Restrictions Weight Bearing Restrictions: Yes RLE  Weight Bearing: Non weight bearing  Pain: Reports pain as 7/10 during therapy session and pt aware of when next dose of medication due (after PT session ends) and pt using heating pad and ice packs for pain management as needed - therapist provided rest breaks, distraction, emotional  support, and repositioning for pain management.   Therapy/Group: Individual Therapy  Tawana Scale , PT, DPT, CSRS 04/21/2020, 3:38 PM

## 2020-04-21 NOTE — Consult Note (Signed)
Neuropsychological Consultation   Patient:   Jordan [name]   DOB:   1965-12-03  MR Number:  [id number]  Location:  [name] [id number] [place] [address] Dept: [place]: [phone]           Date of Service:   04/21/2020  Start Time:   10 AM End Time:   11 AM  Provider/Observer:  Ilean Skill, Psy.D.       Clinical Neuropsychologist       Billing Code/Service: [phone]  Chief Complaint:    Bernadett Milian is a 54 year old female with a history of bipolar disorder and severe anxiety, vaginal cancer, frequent UTIs, chronic back pain, alcohol abuse, morbid obesity.  The patient was admitted on 04/09/2020 after slipping on a puddle of water and/or residue from bug spray with fall at her boyfriend's facility which is a skilled nursing home.  Patient sustained acute communicated fracture of distal tibia diaphysis with 1.4 cm displacement and multiple communicated tibial fracture fragments and acute fracture of fibula diaphysis with 2 cm lateral and 0.5 cm anterior displacement, mildly displaced medial malleolus fracture and nondisplaced talar fracture.  Patient underwent I&D with external fixation of tib/fib fractures.  Postop nonweightbearing on right lower extremity and incisional wound VAC placed.  Patient continues to be limited by weightbearing status and requires regular and repeated cues to maintain precautions.  The patient has severe anxiety around transfers and other aspects of her care.  She has difficulty following multistep commands and has a very strong stress/anxiety reaction with acute exacerbation of pain.  Reason for Service:  Patient was referred for neuropsychological consultation due to coping and adjustment issues with recent tib-fib fracture and extended hospital stay with weightbearing precautions and significant anxiety that is acute on chronic.  Below is the HPI for the  current admission.  HPI: [name] is a 54 year old female with history of bipolar disorder, Vaginal cancer, frequent UTIs, chronic back pain, ETOH abuse, morbid obesity who was admitted on 04/09/20 after a slipping on puddle of water with fall at her boyfriend's facility and sustained  acute comminuted fracture of distal tibial diaphysis with 1.4 cm displacement and multiple comminuted tibial fracture fragments and acute fracture of fibula diaphysis with 2 cm lateral and 0.5 cm anterior displacement, mildly displaced medial malleolus fracture and nondisplaced talar fracture.  She underwent I & D with external fixation of tib/fib fractures by Dr. Griffin Basil. Dr. Doreatha Martin consulted due to complexity of fracture and patient taken to OR for ORIF right tibial shaft, I & D open right tibia fracture and closer treatment of right medial malleolus Fx on 06/21. Post op NWB on RLE and incisional wound VAC to stay in place till next Wed/Thurs. Therapy ongoing and patient continues to be limited by NWB status and requires visual/verbal/tactile cues to maintain precautions, has anxiety and pain affecting functional status with difficulty following multi step commands. CIR recommended due to functional deficits.   Current Status:  Upon entering the room the patient was sitting upright and bed and oriented complaining of significant pain after recently transferred.  The patient is very fixated and ruminating on issues related to how she was injured and the extent of her injuries as well as her pain.  She is also quite fixated on transfers and how they have been done effectively and whenever they are deviations to her care it is very difficult for her to adjust to  be flexible.  The patient has a long history of diagnosis of bipolar disorder and currently she does not appear to be in a active bipolar status of either manic or depressive symptomatology.  The patient is, however, very anxious which is also a common state for her.   She becomes increasingly agitated when she begins became fixated on various aspects and has little coping skills to adjust her calm herself down.  She has some strategies that she uses essentially backing away from the situation but there are very limited resources.  The patient's intellectual and cognitive abilities placed further limitations on her ability to understand and cope with changing dynamic situations.  This is exacerbated by her current hospital stay and she is ultimately rather confused about everything that is going on, fearful whenever she experiences pain that there is been  further injury to her leg.  In any event, the patient has a very concrete and rigid adaptive style with very little capacity for mental flexibility and coping.  Behavioral Observation: Jeryl Wilbourn  presents as a 54 y.o.-year-old Right Caucasian Female who appeared her stated age. her dress was Appropriate and she was Well Groomed and her manners were Appropriate to the situation.  her participation was indicative of Inattentive and Redirectable behaviors.  There were any physical disabilities noted.  she displayed an inappropriate level of cooperation and motivation because of internal preoccupations and anxiety.     Interactions:    Active Inattentive and Redirectable  Attention:   abnormal and attention span appeared shorter than expected for age  Memory:   within normal limits; recent and remote memory intact  Visuo-spatial:  not examined  Speech (Volume):  high  Speech:   normal; normal  Thought Process:  Circumstantial and Tangential  Though Content:  Rumination; not suicidal and not homicidal  Orientation:   person, place, time/date and situation  Judgment:   Poor  Planning:   Poor  Affect:    Anxious and Irritable  Mood:    Anxious  Insight:   Shallow  Intelligence:   low  Substance Use:  There is a documented history of alcohol abuse confirmed by the patient.    Medical  History:   Past Medical History:  Diagnosis Date  . Alcohol abuse   . Anemia    patient denies  . Bipolar 1 disorder (HCC)    No medications currently  . CAP (community acquired pneumonia) 03/17/2015  . 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 Mental disorder   . Obesity   . PICC line infection 05/17/2015  . Sepsis due to Gram negative bacteria (MDR E Coli) 02/18/2015  . 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 UTI (lower urinary tract infection)   . Vaginal Pap smear, abnormal         Abuse/Trauma History: The patient has had 2 separate events with cancer and treatment that have been very stressful and hard for her to cope with, her boyfriend has deteriorated recently is now in skilled nursing, and she most recently has taken a severe fall with tib-fib fracture.  All of these events have been very difficult for her to adapt and cope with.  Psychiatric History:  Patient has a prior history of diagnosis of bipolar disorder, severe anxiety and generic mental disorder.  The patient also appears to have intellectual and cognitive limitations that are probably lifelong.  Family Med/Psych History:  Family History  Problem Relation Age of Onset  . Alcohol  abuse Father   . Cancer Father   . Breast cancer Neg Hx    Impression/DX:  Britton Perkinson is a 54 year old female with a history of bipolar disorder and severe anxiety, vaginal cancer, frequent UTIs, chronic back pain, alcohol abuse, morbid obesity.  The patient was admitted on 04/09/2020 after slipping on a puddle of water and/or residue from bug spray with fall at her boyfriend's facility which is a skilled nursing home.  Patient sustained acute communicated fracture of distal tibia diaphysis with 1.4 cm displacement and multiple communicated tibial fracture fragments and acute fracture of fibula diaphysis with 2 cm lateral and 0.5 cm anterior displacement, mildly displaced medial malleolus fracture and nondisplaced talar  fracture.  Patient underwent I&D with external fixation of tib/fib fractures.  Postop nonweightbearing on right lower extremity and incisional wound VAC placed.  Patient continues to be limited by weightbearing status and requires regular and repeated cues to maintain precautions.  The patient has severe anxiety around transfers and other aspects of her care.  She has difficulty following multistep commands and has a very strong stress/anxiety reaction with acute exacerbation of pain.  Upon entering the room the patient was sitting upright and bed and oriented complaining of significant pain after recently transferred.  The patient is very fixated and ruminating on issues related to how she was injured and the extent of her injuries as well as her pain.  She is also quite fixated on transfers and how they have been done effectively and whenever they are deviations to her care it is very difficult for her to adjust to be flexible.  The patient has a long history of diagnosis of bipolar disorder and currently she does not appear to be in a active bipolar status of either manic or depressive symptomatology.  The patient is, however, very anxious which is also a common state for her.  She becomes increasingly agitated when she begins became fixated on various aspects and has little coping skills to adjust her calm herself down.  She has some strategies that she uses essentially backing away from the situation but there are very limited resources.  The patient's intellectual and cognitive abilities placed further limitations on her ability to understand and cope with changing dynamic situations.  This is exacerbated by her current hospital stay and she is ultimately rather confused about everything that is going on, fearful whenever she experiences pain that there is been  further injury to her leg.  In any event, the patient has a very concrete and rigid adaptive style with very little capacity for mental flexibility  and coping.  Disposition/Plan:  Today we worked on coping and adjustment issues with the patient.  The patient is very concrete and rigid in her thinking and keeping that in mind we will probably very helpful for therapy and nursing interventions needed.  Explaining the purpose of various procedures and the safety of the procedures would likely help alleviate some of the patient's anxiety.  She does not cope well when she experiences 1 strategy for therapy or nursing and experiences another strategy that may be equally appropriate.  Explaining the safety features of various strategies I had a time would likely be very helpful and reduce the overall time taken to complete a task.  The patient does not appear to be in an active bipolar state but her anxiety is severe and her recent experiences and pain symptoms are having a exacerbating impact on it.  I will follow up with the  patient first of next week.        Electronically Signed   _______________________ Ilean Skill, Psy.D.

## 2020-04-21 NOTE — Progress Notes (Signed)
Physical Therapy Session Note  Patient Details  Name: Jordan [name] MRN: [id number] Date of Birth: 09/04/1966  Today's Date: 04/21/2020 PT Individual Time: 9758-8325 PT Individual Time Calculation (min): 50 min   Short Term Goals: Week 1:  PT Short Term Goal 1 (Week 1): Patient will perform bed mobility with supervision without use of bed rails. PT Short Term Goal 2 (Week 1): Patient will perform basic transfers using LRAD with min A while maintaining R LE NWB precautions. PT Short Term Goal 3 (Week 1): Patient will propel the w/c >50 ft with supervision.  Skilled Therapeutic Interventions/Progress Updates:    Patient received in bed reporting 11/10 pain and appearing to be in moderate distress; she had been premedicated and requested adjustment of ACE wraps, this PT unwrapped and re-wrapped R LE with 3 ACE wraps with significant decrease in pain reported following adjustment of wrappings, however she remained pain limited during session. Otherwise focused on functional bed exercises including ankle pumps, quad sets, SAQs, SLRs, glute sets, supine hip abduction, bicep curls/shoulder flexion/scapular retraction/flies with green TB with Max cues for form and pacing of exercise. Left in bed with all needs met, bed alarm active this morning. 10 minutes of PT missed due to pain and fatigue.   Therapy Documentation Precautions:  Precautions Precautions: Fall Precaution Comments: has anxiety with mobility Required Braces or Orthoses: Other Brace Other Brace: CAM boot to R LE Restrictions Weight Bearing Restrictions: Yes RLE Weight Bearing: Non weight bearing General: PT Amount of Missed Time (min): 10 Minutes PT Missed Treatment Reason: Patient fatigue;Pain Vital Signs:  Pain: Pain Assessment Pain Scale: 0-10 Pain Score: 10-Worst pain ever Pain Type: Surgical pain Pain Location: Leg Pain Orientation: Right Pain Descriptors / Indicators: Aching;Sharp;Sore Pain Onset: On-going Patients  Stated Pain Goal: 3 Pain Intervention(s): Repositioned;Other (Comment) (premedicated, adjusted tightness of ace wraps) Multiple Pain Sites: No    Therapy/Group: Individual Therapy   Windell Norfolk, DPT, PN1   Supplemental Physical Therapist [place]    Pager [phone] Acute Rehab Office [phone]    04/21/2020, 12:25 PM

## 2020-04-21 NOTE — Progress Notes (Signed)
The chaplain practiced reflective listening as the Pt. chose to update the chaplain on the day's activities.During this time, the chaplain affirmed the Pt. choice to pause and breathe when her level of anxiety is uncomfortable.   By the end of the spiritual care visit the Pt. was able to carry on a conversation with the chaplain without stopping to focus on her breathing.  The chaplain will continue F/U spiritual care as needed.

## 2020-04-21 NOTE — Progress Notes (Signed)
Occupational Therapy Weekly Progress Note  Patient Details  Name: Jordan [name] MRN: [id number] Date of Birth: 09/29/1966  Beginning of progress report period: April 16, 2020 End of progress report period: April 21, 2020  Today's Date: 04/21/2020 OT Individual Time: 0832-0945 OT Individual Time Calculation (min): 73 min    Patient has met 3 of 3 short term goals.  Pt has made slow, but steady progress this week. Pt has been limited by severe anxiety, making new learning difficult for patient. Pt is the most comfortable with AP transfers, which she is doing now with assistance to hold the chair and manage her R LE. She is very apprehensive to try any other transfer and can be self-limiting at times. LB ADLs are done from bed level with mostly min A as pt is very flexible and can achieve long sitting and circle sitting with ease. Continue current POC.  Patient continues to demonstrate the following deficits: muscle weakness and decreased activity tolerance, pain, anxiety,  and decreased sitting balance, decreased standing balance, decreased balance strategies and difficulty maintaining precautions and therefore will continue to benefit from skilled OT intervention to enhance overall performance with BADL and Reduce care partner burden.  Patient progressing toward long term goals..  Continue plan of care.  OT Short Term Goals Week 1:  OT Short Term Goal 1 (Week 1): Pt will complete a BSC or toilet transfer with 1 assist using LRAD while adhering to WB precautions OT Short Term Goal 1 - Progress (Week 1): Met OT Short Term Goal 2 (Week 1): Pt will complete 1 grooming task while sitting at the sink to increase OOB tolerance OT Short Term Goal 2 - Progress (Week 1): Met OT Short Term Goal 3 (Week 1): Pt will engage in 1 simulated IADL task w/c level with supervision assistance OT Short Term Goal 3 - Progress (Week 1): Met Week 2:  OT Short Term Goal 1 (Week 2): Pt will complete 2 BSC transfers this  week with 1 person assist OT Short Term Goal 2 (Week 2): Pt will complete 1 step of toileting task OT Short Term Goal 3 (Week 2): Pt will assist with donning 25% of CAM boot  Skilled Therapeutic Interventions/Progress Updates:    Pt greeted semi-reclined in bed. Pt perseverating on nursing not knowing how to get her back in bed. OT found printout with pictures for AP transfer and gave one to patient and placed one on her bulletin board to help guide nursing with transfers. Pt then completed bed level bathing/dressing with set-up A overall and min A to wash buttocks. OT re-wrapped R LE for edema management. Pt very verbose throughout session and needed increased time and multiple cues to try to remain on task. Pt came into long circle sitting and was able to wash toes and don clean socks. Pt declined to get OOB at this time 2/2 pain. OT educated again on progression with transfers including BSC transfers. Pt continues to have a lot of anxiety surrounding BSC transfers, but will continue working on these. Discussed that the more she does the transfers, the less scary it will be and the less anxiety she will have. Pt left semi-reclined in bed with bed alarm on, call bell in reach, and needs met.   Therapy Documentation Precautions:  Precautions Precautions: Fall Precaution Comments: has anxiety with mobility Required Braces or Orthoses: Other Brace Other Brace: CAM boot to R LE Restrictions Weight Bearing Restrictions: Yes RLE Weight Bearing: Non weight bearing Pain: Pain   Assessment Pain Scale: 0-10 Pain Score: 6  Pain Type: Surgical pain Pain Location: Leg Pain Orientation: Right Pain Frequency: Constant Pain Intervention(s): Repositioned   Therapy/Group: Individual Therapy  Valma Cava 04/21/2020, 9:46 AM

## 2020-04-21 NOTE — Progress Notes (Addendum)
[facility] PHYSICAL MEDICINE & REHABILITATION PROGRESS NOTE   Subjective/Complaints: Up in bed. Boot off. In reasonable spirits. Asked about "infection in my urine". Happy that she's moving a litle better and that swelling is improved in RLE  ROS: Patient denies fever, rash, sore throat, blurred vision, nausea, vomiting, diarrhea, cough, shortness of breath or chest pain,   headache, or mood change.     Objective:   No results found. Recent Labs    04/19/20 0558  WBC 4.0  HGB 10.6*  HCT 32.2*  PLT 204   Recent Labs    04/19/20 0558  NA 137  K 4.5  CL 102  CO2 27  GLUCOSE 104*  BUN 12  CREATININE 0.68  CALCIUM 8.7*    Intake/Output Summary (Last 24 hours) at 04/21/2020 1138 Last data filed at 04/21/2020 0836 Gross per 24 hour  Intake 720 ml  Output 900 ml  Net -180 ml     Physical Exam: Vital Signs Blood pressure 114/74, pulse (!) 109, temperature 97.7 F (36.5 C), temperature source Oral, resp. rate 20, height 5\' 8"  (1.727 m), weight (!) 147.6 kg, SpO2 99 %. Constitutional: No distress . Vital signs reviewed. HEENT: EOMI, oral membranes moist Neck: supple Cardiovascular: RRR without murmur. No JVD    Respiratory/Chest: CTA Bilaterally without wheezes or rales. Normal effort    GI/Abdomen: BS +, non-tender, non-distended Ext: no clubbing, cyanosis, or edema Psych: pleasant but anxious Musculoskeletal:     Cervical back: Normal range of motion and neck supple. No rigidity.   RLE: 1++ edema, improving    Neuro:  Comments: UEs B/L 5/5 in deltoid, biceps, triceps, grip and finger abd B/L LLE- 5/5 in HF, KE, KF, DF and PF RLE-limited by pain. Is able to ADF/PF. Sensation seems normal Skin: numerous abrasions/incisions with sutures in RLE all CDI, most with foam dressings   Assessment/Plan: 1. Functional deficits secondary to RLE tib/fib fx s/p ex-fix and ORIF_ NWB on RLE which require 3+ hours per day of interdisciplinary therapy in a comprehensive inpatient  rehab setting.  Physiatrist is providing close team supervision and 24 hour management of active medical problems listed below.  Physiatrist and rehab team continue to assess barriers to discharge/monitor patient progress toward functional and medical goals  Care Tool:  Bathing  Bathing activity did not occur:  (N/A) Body parts bathed by patient: Right arm, Left arm, Chest, Abdomen, Right upper leg, Left upper leg, Face, Front perineal area, Left lower leg   Body parts bathed by helper: Buttocks Body parts n/a: Right lower leg   Bathing assist Assist Level: Minimal Assistance - Patient > 75%     Upper Body Dressing/Undressing Upper body dressing   What is the patient wearing?: Hospital gown only    Upper body assist Assist Level: Set up assist    Lower Body Dressing/Undressing Lower body dressing      What is the patient wearing?: Incontinence brief     Lower body assist Assist for lower body dressing: Moderate Assistance - Patient 50 - 74%     Toileting Toileting Toileting Activity did not occur (Clothing management and hygiene only): N/A (no void or bm)  Toileting assist Assist for toileting: Moderate Assistance - Patient 50 - 74%     Transfers Chair/bed transfer  Transfers assist  Chair/bed transfer activity did not occur: Safety/medical concerns (unable to maintain R LE NWB)  Chair/bed transfer assist level: 2 Helpers (A/P) Chair/bed transfer assistive device: Sliding board   Locomotion Ambulation  Ambulation assist   Ambulation activity did not occur: Safety/medical concerns (unable to maintain R LE NWB in standing)          Walk 10 feet activity   Assist  Walk 10 feet activity did not occur: Safety/medical concerns        Walk 50 feet activity   Assist Walk 50 feet with 2 turns activity did not occur: Safety/medical concerns         Walk 150 feet activity   Assist Walk 150 feet activity did not occur: Safety/medical concerns          Walk 10 feet on uneven surface  activity   Assist Walk 10 feet on uneven surfaces activity did not occur: Safety/medical concerns         Wheelchair     Assist Will patient use wheelchair at discharge?: Yes Type of Wheelchair: Manual    Wheelchair assist level: Supervision/Verbal cueing Max wheelchair distance: 15 ft    Wheelchair 50 feet with 2 turns activity    Assist    Wheelchair 50 feet with 2 turns activity did not occur:  (decreased activity tolerance)   Assist Level: Maximal Assistance - Patient 25 - 49%   Wheelchair 150 feet activity     Assist  Wheelchair 150 feet activity did not occur:  (decreased activity tolerance)   Assist Level: Total Assistance - Patient < 25%   Blood pressure 114/74, pulse (!) 109, temperature 97.7 F (36.5 C), temperature source Oral, resp. rate 20, height 5\' 8"  (1.727 m), weight (!) 147.6 kg, SpO2 99 %.  Medical Problem List and Plan: 1.  Impaired function secondary to RLE tib/fib comminuted fxs after fall-ORIF 6/21 by Dr. Doreatha Martin is NWB on RLE             -patient may  Shower if RLE is covered             -ELOS/Goals: 14-18 days- mod I to supervision  -continue CIR.  2.  Antithrombotics: -DVT/anticoagulation:  Pharmaceutical: Lovenox             -antiplatelet therapy: N/A 3. Pain Management: prn oxycodone   -gabapentin 100mg  tid  -ice/heat, edmea control, reassurance 4. Mood: LCSW to follow for evaluation and support.              -antipsychotic agents: On Latuda  -evaluation pending with Dr. Sima Matas re: anxiety,? PTSD in setting of her baseline bipolar disorder   -continue trileptal and latuda per baseline   7/2 -she feels that xanax helps with anxiety but also feels that it makes her sleepy. We decided to change it to prn for now  5. Neuropsych: This patient is capable of making decisions on her own behalf. 6. Skin/Wound Care: Routine pressure relief measures.    -changed to foam dressings to  RLE  -ACE wrap and elevation for edema control.  7. Fluids/Electrolytes/Nutrition: encourage po.     7/2 -vitamin D level ordered by ortho, low--resume 50k vit D Q week 9. H/o Nocturia/frequent UTIs: On ditropan.   -oob to void  -urine cx + 100K E Coli (ESBL)   -contact precautions   -sensitive to macrobid--->7 day rx 10. Constipation:    6/30: now with diarrhea- not foul smelling. Prescribed Imodium which she takes at home.   7/1-2 stools more formed   -continue prn imodium   -added probiotic        LOS: 6 days A FACE TO [place]  Meredith Staggers  04/21/2020, 11:38 AM

## 2020-04-22 ENCOUNTER — Inpatient Hospital Stay (HOSPITAL_COMMUNITY): Payer: Medicaid Other

## 2020-04-22 NOTE — Progress Notes (Signed)
[facility] PHYSICAL MEDICINE & REHABILITATION PROGRESS NOTE   Subjective/Complaints: Patient without new issues.  ROS: Patient denies chest pain shortness of breath nausea vomiting diarrhea constipation Objective:   No results found. No results for input(s): WBC, HGB, HCT, PLT in the last 72 hours. No results for input(s): NA, K, CL, CO2, GLUCOSE, BUN, CREATININE, CALCIUM in the last 72 hours.  Intake/Output Summary (Last 24 hours) at 04/22/2020 1230 Last data filed at 04/22/2020 1110 Gross per 24 hour  Intake 1160 ml  Output 500 ml  Net 660 ml     Physical Exam: Vital Signs Blood pressure (!) 102/54, pulse 88, temperature 97.7 F (36.5 C), resp. rate 19, height 5\' 8"  (1.727 m), weight (!) 147.6 kg, SpO2 100 %.  General: No acute distress Mood and affect are appropriate Heart: Regular rate and rhythm no rubs murmurs or extra sounds Lungs: Clear to auscultation, breathing unlabored, no rales or wheezes Abdomen: Positive bowel sounds, soft nontender to palpation, nondistended Extremities: No clubbing, cyanosis, or edema Skin: No evidence of breakdown, no evidence of rash   Neuro:  Comments: UEs B/L 5/5 in deltoid, biceps, triceps, grip and finger abd B/L LLE- 5/5 in HF, KE, KF, DF and PF RLE-limited by pain. Is able to ADF/PF. Sensation seems normal Skin: numerous abrasions/incisions with sutures in RLE all CDI, most with foam dressings   Assessment/Plan: 1. Functional deficits secondary to RLE tib/fib fx s/p ex-fix and ORIF_ NWB on RLE which require 3+ hours per day of interdisciplinary therapy in a comprehensive inpatient rehab setting.  Physiatrist is providing close team supervision and 24 hour management of active medical problems listed below.  Physiatrist and rehab team continue to assess barriers to discharge/monitor patient progress toward functional and medical goals  Care Tool:  Bathing  Bathing activity did not occur:  (N/A) Body parts bathed by patient:  Right arm, Left arm, Chest, Abdomen, Right upper leg, Left upper leg, Face, Front perineal area, Left lower leg   Body parts bathed by helper: Buttocks Body parts n/a: Right lower leg   Bathing assist Assist Level: Minimal Assistance - Patient > 75%     Upper Body Dressing/Undressing Upper body dressing   What is the patient wearing?: Hospital gown only    Upper body assist Assist Level: Set up assist    Lower Body Dressing/Undressing Lower body dressing      What is the patient wearing?: Incontinence brief     Lower body assist Assist for lower body dressing: Moderate Assistance - Patient 50 - 74%     Toileting Toileting Toileting Activity did not occur Landscape architect and hygiene only): N/A (no void or bm)  Toileting assist Assist for toileting: Minimal Assistance - Patient > 75%     Transfers Chair/bed transfer  Transfers assist  Chair/bed transfer activity did not occur: Safety/medical concerns (unable to maintain R LE NWB)  Chair/bed transfer assist level: 2 Helpers (A/P) Chair/bed transfer assistive device: Sliding board   Locomotion Ambulation   Ambulation assist   Ambulation activity did not occur: Safety/medical concerns (unable to maintain R LE NWB in standing)          Walk 10 feet activity   Assist  Walk 10 feet activity did not occur: Safety/medical concerns        Walk 50 feet activity   Assist Walk 50 feet with 2 turns activity did not occur: Safety/medical concerns         Walk 150 feet activity   Assist  Walk 150 feet activity did not occur: Safety/medical concerns         Walk 10 feet on uneven surface  activity   Assist Walk 10 feet on uneven surfaces activity did not occur: Safety/medical concerns         Wheelchair     Assist Will patient use wheelchair at discharge?: Yes Type of Wheelchair: Manual    Wheelchair assist level: Supervision/Verbal cueing Max wheelchair distance: 15 ft     Wheelchair 50 feet with 2 turns activity    Assist    Wheelchair 50 feet with 2 turns activity did not occur:  (decreased activity tolerance)   Assist Level: Maximal Assistance - Patient 25 - 49%   Wheelchair 150 feet activity     Assist  Wheelchair 150 feet activity did not occur:  (decreased activity tolerance)   Assist Level: Total Assistance - Patient < 25%   Blood pressure (!) 102/54, pulse 88, temperature 97.7 F (36.5 C), resp. rate 19, height 5\' 8"  (1.727 m), weight (!) 147.6 kg, SpO2 100 %.  Medical Problem List and Plan: 1.  Impaired function secondary to RLE tib/fib comminuted fxs after fall-ORIF 6/21 by Dr. Doreatha Martin is NWB on RLE             -patient may  Shower if RLE is covered             -ELOS/Goals: 14-18 days- mod I to supervision  -continue CIR.  2.  Antithrombotics: -DVT/anticoagulation:  Pharmaceutical: Lovenox             -antiplatelet therapy: N/A 3. Pain Management: prn oxycodone   -gabapentin 100mg  tid  -ice/heat, edmea control, reassurance 4. Mood: LCSW to follow for evaluation and support.              -antipsychotic agents: On Latuda  -evaluation pending with Dr. Sima Matas re: anxiety,? PTSD in setting of her baseline bipolar disorder   -continue trileptal and latuda per baseline   7/2 -she feels that xanax helps with anxiety but also feels that it makes her sleepy. We decided to change it to prn for now  No complaints of drowsiness today 5. Neuropsych: This patient is capable of making decisions on her own behalf. 6. Skin/Wound Care: Routine pressure relief measures.    -changed to foam dressings to RLE  -ACE wrap and elevation for edema control.  7. Fluids/Electrolytes/Nutrition: encourage po.     7/2 -vitamin D level ordered by ortho, low--resume 50k vit D Q week 9. H/o Nocturia/frequent UTIs: On ditropan.   -oob to void  -urine cx + 100K E Coli (ESBL)   -contact precautions   -sensitive to macrobid--->7 day rx 10. Constipation:     6/30: now with diarrhea- not foul smelling. Prescribed Imodium which she takes at home.   7/1-2 stools more formed   -continue prn imodium   -added probiotic        LOS: 7 days A FACE TO [place] E Shiv Shuey 04/22/2020, 12:30 PM

## 2020-04-22 NOTE — Plan of Care (Signed)
  Problem: RH BOWEL ELIMINATION Goal: RH STG MANAGE BOWEL WITH ASSISTANCE Description: STG Manage Bowel with mod I Assistance. Outcome: Progressing Goal: RH STG MANAGE BOWEL W/MEDICATION W/ASSISTANCE Description: STG Manage Bowel with Medication with min Assistance. Outcome: Progressing   Problem: RH BLADDER ELIMINATION Goal: RH STG MANAGE BLADDER WITH ASSISTANCE Description: STG Manage Bladder With mod I Assistance Outcome: Progressing   Problem: RH SKIN INTEGRITY Goal: RH STG MAINTAIN SKIN INTEGRITY WITH ASSISTANCE Description: STG Maintain Skin Integrity With min Assistance. Outcome: Progressing

## 2020-04-23 LAB — URINE CULTURE: Culture: >=100000 — AB

## 2020-04-23 NOTE — Progress Notes (Signed)
[facility] PHYSICAL MEDICINE & REHABILITATION PROGRESS NOTE   Subjective/Complaints: .  Patient needs to urinate but otherwise has no complaints.  ROS: Patient denies chest pain shortness of breath nausea vomiting diarrhea constipation Objective:   No results found. No results for input(s): WBC, HGB, HCT, PLT in the last 72 hours. No results for input(s): NA, K, CL, CO2, GLUCOSE, BUN, CREATININE, CALCIUM in the last 72 hours.  Intake/Output Summary (Last 24 hours) at 04/23/2020 1131 Last data filed at 04/23/2020 0747 Gross per 24 hour  Intake 840 ml  Output 775 ml  Net 65 ml     Physical Exam: Vital Signs Blood pressure 124/77, pulse 89, temperature 98.1 F (36.7 C), temperature source Oral, resp. rate 19, height 5\' 8"  (1.727 m), weight (!) 147.6 kg, SpO2 99 %.   General: No acute distress Mood and affect are appropriate Heart: Regular rate and rhythm no rubs murmurs or extra sounds Lungs: Clear to auscultation, breathing unlabored, no rales or wheezes Abdomen: Positive bowel sounds, soft nontender to palpation, nondistended Extremities: No clubbing, cyanosis, or edema Skin: No evidence of breakdown, no evidence of rash    Neuro:  Comments: UEs B/L 5/5 in deltoid, biceps, triceps, grip and finger abd B/L LLE- 5/5 in HF, KE, KF, DF and PF RLE-limited by pain. Is able to ADF/PF. Sensation seems normal Skin: numerous abrasions/incisions with sutures in RLE all CDI, most with foam dressings   Assessment/Plan: 1. Functional deficits secondary to RLE tib/fib fx s/p ex-fix and ORIF_ NWB on RLE which require 3+ hours per day of interdisciplinary therapy in a comprehensive inpatient rehab setting.  Physiatrist is providing close team supervision and 24 hour management of active medical problems listed below.  Physiatrist and rehab team continue to assess barriers to discharge/monitor patient progress toward functional and medical goals  Care Tool:  Bathing  Bathing activity  did not occur:  (N/A) Body parts bathed by patient: Right arm, Left arm, Chest, Abdomen, Right upper leg, Left upper leg, Face, Front perineal area, Left lower leg   Body parts bathed by helper: Buttocks Body parts n/a: Right lower leg   Bathing assist Assist Level: Minimal Assistance - Patient > 75%     Upper Body Dressing/Undressing Upper body dressing   What is the patient wearing?: Hospital gown only    Upper body assist Assist Level: Set up assist    Lower Body Dressing/Undressing Lower body dressing      What is the patient wearing?: Incontinence brief     Lower body assist Assist for lower body dressing: Moderate Assistance - Patient 50 - 74%     Toileting Toileting Toileting Activity did not occur Landscape architect and hygiene only): N/A (no void or bm)  Toileting assist Assist for toileting: Minimal Assistance - Patient > 75%     Transfers Chair/bed transfer  Transfers assist  Chair/bed transfer activity did not occur: Safety/medical concerns (unable to maintain R LE NWB)  Chair/bed transfer assist level: 2 Helpers (A/P) Chair/bed transfer assistive device: Sliding board   Locomotion Ambulation   Ambulation assist   Ambulation activity did not occur: Safety/medical concerns (unable to maintain R LE NWB in standing)          Walk 10 feet activity   Assist  Walk 10 feet activity did not occur: Safety/medical concerns        Walk 50 feet activity   Assist Walk 50 feet with 2 turns activity did not occur: Safety/medical concerns  Walk 150 feet activity   Assist Walk 150 feet activity did not occur: Safety/medical concerns         Walk 10 feet on uneven surface  activity   Assist Walk 10 feet on uneven surfaces activity did not occur: Safety/medical concerns         Wheelchair     Assist Will patient use wheelchair at discharge?: Yes Type of Wheelchair: Manual    Wheelchair assist level: Supervision/Verbal  cueing Max wheelchair distance: 15 ft    Wheelchair 50 feet with 2 turns activity    Assist    Wheelchair 50 feet with 2 turns activity did not occur:  (decreased activity tolerance)   Assist Level: Maximal Assistance - Patient 25 - 49%   Wheelchair 150 feet activity     Assist  Wheelchair 150 feet activity did not occur:  (decreased activity tolerance)   Assist Level: Total Assistance - Patient < 25%   Blood pressure 124/77, pulse 89, temperature 98.1 F (36.7 C), temperature source Oral, resp. rate 19, height 5\' 8"  (1.727 m), weight (!) 147.6 kg, SpO2 99 %.  Medical Problem List and Plan: 1.  Impaired function secondary to RLE tib/fib comminuted fxs after fall-ORIF 6/21 by Dr. Doreatha Martin is NWB on RLE             -patient may  Shower if RLE is covered             -ELOS/Goals: 14-18 days- mod I to supervision  -continue CIR PT, OT.  2.  Antithrombotics: -DVT/anticoagulation:  Pharmaceutical: Lovenox             -antiplatelet therapy: N/A 3. Pain Management: prn oxycodone   -gabapentin 100mg  tid  -ice/heat, edmea control, reassurance 4. Mood: LCSW to follow for evaluation and support.              -antipsychotic agents: On Latuda  -evaluation pending with Dr. Sima Matas re: anxiety,? PTSD in setting of her baseline bipolar disorder   -continue trileptal and latuda per baseline   7/2 -she feels that xanax helps with anxiety but also feels that it makes her sleepy. We decided to change it to prn for now  No complaints of drowsiness today 5. Neuropsych: This patient is capable of making decisions on her own behalf. 6. Skin/Wound Care: Routine pressure relief measures.    -changed to foam dressings to RLE  -ACE wrap and elevation for edema control.  7. Fluids/Electrolytes/Nutrition: encourage po.     7/2 -vitamin D level ordered by ortho, low--resume 50k vit D Q week 9. H/o Nocturia/frequent UTIs: On ditropan.   -oob to void  -urine cx + 100K E Coli (ESBL)   -contact  precautions   -sensitive to macrobid--->7 day rx, continent of urine 10. Constipation:    6/30: now with diarrhea- not foul smelling. Prescribed Imodium which she takes at home.   7/1-2 stools more formed   -continue prn imodium   -added probiotic   Incontinent of bowel with soft stools.     LOS: 8 days A FACE TO FACE EVALUATION WAS PERFORMED  Charlett Blake 04/23/2020, 11:31 AM

## 2020-04-23 NOTE — Plan of Care (Signed)
  Problem: Consults Goal: RH GENERAL PATIENT EDUCATION Description: See Patient Education module for education specifics. Outcome: Progressing   Problem: RH BOWEL ELIMINATION Goal: RH STG MANAGE BOWEL WITH ASSISTANCE Description: STG Manage Bowel with mod I Assistance. Outcome: Progressing Goal: RH STG MANAGE BOWEL W/MEDICATION W/ASSISTANCE Description: STG Manage Bowel with Medication with min Assistance. Outcome: Progressing   Problem: RH BLADDER ELIMINATION Goal: RH STG MANAGE BLADDER WITH ASSISTANCE Description: STG Manage Bladder With mod I Assistance Outcome: Progressing   Problem: RH SKIN INTEGRITY Goal: RH STG MAINTAIN SKIN INTEGRITY WITH ASSISTANCE Description: STG Maintain Skin Integrity With min Assistance. Outcome: Progressing Goal: RH STG ABLE TO PERFORM INCISION/WOUND CARE W/ASSISTANCE Description: STG Able To Perform Incision/Wound Care With min Assistance. Outcome: Progressing

## 2020-04-23 NOTE — Progress Notes (Signed)
Physical Therapy Weekly Progress Note  Patient Details  Name: Jordan [name] MRN: [id number] Date of Birth: 20-Feb-1966  Beginning of progress report period: April 16, 2020 End of progress report period: April 23, 2020  Today's Date: 04/23/2020   Patient has met 1 of 3 short term goals.  Pt has made slow, but steady progress this week. Pt has been limited by severe anxiety, making new learning difficult for patient. Pt is the most comfortable with AP transfers, which she is doing now with assistance to hold the chair and manage her R LE. She is very apprehensive to try any other transfer and can be self-limiting at times. She performs bed mobility with min A-supervision and has initiated w/c propulsion in a bariatric w/c. Continue current POC.  Patient continues to demonstrate the following deficits muscle weakness and muscle joint tightness, decreased cardiorespiratoy endurance and decreased sitting balance, decreased standing balance, decreased postural control, decreased balance strategies and difficulty maintaining precautions and therefore will continue to benefit from skilled PT intervention to increase functional independence with mobility.  Patient progressing toward long term goals..  Continue plan of care.  PT Short Term Goals Week 1:  PT Short Term Goal 1 (Week 1): Patient will perform bed mobility with supervision without use of bed rails. PT Short Term Goal 1 - Progress (Week 1): Progressing toward goal PT Short Term Goal 2 (Week 1): Patient will perform basic transfers using LRAD with min A while maintaining R LE NWB precautions. PT Short Term Goal 2 - Progress (Week 1): Met PT Short Term Goal 3 (Week 1): Patient will propel the w/c >50 ft with supervision. PT Short Term Goal 3 - Progress (Week 1): Progressing toward goal Week 2:  PT Short Term Goal 1 (Week 2): Patient will perform basic transfer with supervision for safety and set-up. PT Short Term Goal 2 (Week 2): Patient will  propell w/c 50 feet in simulated household space. PT Short Term Goal 3 (Week 2): Patient will perform bed mobility with supervision consistently with use of hospital bed functions.   Therapy Documentation Precautions:  Precautions Precautions: Fall Precaution Comments: has anxiety with mobility Required Braces or Orthoses: Other Brace Other Brace: CAM boot to R LE Restrictions Weight Bearing Restrictions: Yes RLE Weight Bearing: Non weight bearing   Cherie L Grunenberg PT, DPT  04/23/2020, 5:40 PM

## 2020-04-24 ENCOUNTER — Inpatient Hospital Stay (HOSPITAL_COMMUNITY): Payer: Medicaid Other | Admitting: Occupational Therapy

## 2020-04-24 ENCOUNTER — Inpatient Hospital Stay (HOSPITAL_COMMUNITY): Payer: Medicaid Other | Admitting: Physical Therapy

## 2020-04-24 NOTE — Plan of Care (Signed)
Goals downgraded 2/2 slow progress & decreased ability to maintain NWB RLE.   Problem: RH Balance Goal: LTG Patient will maintain dynamic standing balance (PT) Description: LTG:  Patient will maintain dynamic standing balance with assistance during mobility activities (PT) Flowsheets (Taken 04/24/2020 1230) LTG: Pt will maintain dynamic standing balance during mobility activities with:: (downgraded 2/2 slow progress) Minimal Assistance - Patient > 75% Note: downgraded 2/2 slow progress   Problem: Sit to Stand Goal: LTG:  Patient will perform sit to stand with assistance level (PT) Description: LTG:  Patient will perform sit to stand with assistance level (PT) Flowsheets (Taken 04/24/2020 1230) LTG: PT will perform sit to stand in preparation for functional mobility with assistance level: (downgraded 2/2 slow progress) Moderate Assistance - Patient 50 - 74% Note: downgraded 2/2 slow progress   Problem: RH Bed to Chair Transfers Goal: LTG Patient will perform bed/chair transfers w/assist (PT) Description: LTG: Patient will perform bed to chair transfers with assistance (PT). Flowsheets (Taken 04/24/2020 1230) LTG: Pt will perform Bed to Chair Transfers with assistance level: (downgraded 2/2 slow progress) Minimal Assistance - Patient > 75% Note: downgraded 2/2 slow progress

## 2020-04-24 NOTE — Plan of Care (Signed)
  Problem: Consults Goal: RH GENERAL PATIENT EDUCATION Description: See Patient Education module for education specifics. Outcome: Progressing   Problem: RH BOWEL ELIMINATION Goal: RH STG MANAGE BOWEL WITH ASSISTANCE Description: STG Manage Bowel with mod I Assistance. Outcome: Progressing Goal: RH STG MANAGE BOWEL W/MEDICATION W/ASSISTANCE Description: STG Manage Bowel with Medication with min Assistance. Outcome: Progressing   Problem: RH BLADDER ELIMINATION Goal: RH STG MANAGE BLADDER WITH ASSISTANCE Description: STG Manage Bladder With mod I Assistance Outcome: Progressing   Problem: RH SKIN INTEGRITY Goal: RH STG MAINTAIN SKIN INTEGRITY WITH ASSISTANCE Description: STG Maintain Skin Integrity With min Assistance. Outcome: Progressing Goal: RH STG ABLE TO PERFORM INCISION/WOUND CARE W/ASSISTANCE Description: STG Able To Perform Incision/Wound Care With min Assistance. Outcome: Progressing   Problem: RH SAFETY Goal: RH STG ADHERE TO SAFETY PRECAUTIONS W/ASSISTANCE/DEVICE Description: STG Adhere to Safety Precautions With cues/reminders Assistance/Device. Outcome: Progressing   Problem: RH PAIN MANAGEMENT Goal: RH STG PAIN MANAGED AT OR BELOW PT'S PAIN GOAL Description: Less than 4 on 0-10 scale  Outcome: Progressing

## 2020-04-24 NOTE — Progress Notes (Signed)
Physical Therapy Session Note  Patient Details  Name: Jordan [name] MRN: [id number] Date of Birth: 22-Mar-1966  Today's Date: 04/24/2020 PT Individual Time: 1052-1203 PT Individual Time Calculation (min): 71 min   Short Term Goals: Week 2:  PT Short Term Goal 1 (Week 2): Patient will perform basic transfer with supervision for safety and set-up. PT Short Term Goal 2 (Week 2): Patient will propell w/c 50 feet in simulated household space. PT Short Term Goal 3 (Week 2): Patient will perform bed mobility with supervision consistently with use of hospital bed functions.  Skilled Therapeutic Interventions/Progress Updates:  Pt received in bed & agreeable to tx. Therapist re-wrapped ace wrap on RLE in figure 8 pattern. Pt dons B non skid socks bed level with assistance for R sock. Pt unable to don cam boot, requiring max assist instead, but is able to doff boot from bed level. Pt rolls L<>R with hospital bed features & supervision, sit>supine with supervision, and supine>sit EOB with CGA for RLE. Pt attempts sit>stand at EOB but is unable to begin to clear buttocks from bed and appears to have anxiety over transfer & maintaining NWB RLE. Pt returns to bed and attempts A/P transfer bed>w/c at equal heights but is unable to complete after multiple attempts 2/2 RLE pain. Pt reports need to void & therapist provides total assist for managing female urinal & pt with continent void. Therapist provides assistance with donning clean brief. Spent extensive time discussing pt's home set up (handicap accessible studio apartment) & need for pt to be independent at home as her daughter lives in [place] and is unable to assist.  Therapist educates pt on recommendation of assistance at home based on pt's abilities during session today & after discussing pt's slow overall progress with functional mobility & pt is in agreement. This therapist will notify team of recommendations of assistance at d/c & goals to be modified to  reflect that. At end of session pt left in bed with alarm set & all needs in reach.   Therapy Documentation Precautions:  Precautions Precautions: Fall Precaution Comments: has anxiety with mobility Required Braces or Orthoses: Other Brace Other Brace: CAM boot to R LE Restrictions Weight Bearing Restrictions: Yes RLE Weight Bearing: Non weight bearing  Pain: C/o unrated RLE pain - nurse administered meds at beginning of session   Therapy/Group: Individual Therapy  [name] 04/24/2020, 12:23 PM

## 2020-04-24 NOTE — Progress Notes (Signed)
[place] PHYSICAL MEDICINE & REHABILITATION PROGRESS NOTE   Subjective/Complaints: Up in bed. Asked if she would be able to receive help at home. Right leg feels better although therapy reports she struggled with pain today  ROS: Patient denies fever, rash, sore throat, blurred vision, nausea, vomiting, diarrhea, cough, shortness of breath or chest pain,  headache, or mood change.   Objective:   No results found. No results for input(s): WBC, HGB, HCT, PLT in the last 72 hours. No results for input(s): NA, K, CL, CO2, GLUCOSE, BUN, CREATININE, CALCIUM in the last 72 hours.  Intake/Output Summary (Last 24 hours) at 04/24/2020 1339 Last data filed at 04/24/2020 1331 Gross per 24 hour  Intake 960 ml  Output 1700 ml  Net -740 ml     Physical Exam: Vital Signs Blood pressure (!) 118/48, pulse 96, temperature 98.4 F (36.9 C), temperature source Oral, resp. rate 20, height 5\' 8"  (1.727 m), weight (!) 147.6 kg, SpO2 99 %.   General: No acute distress Mood and affect are appropriate Heart: Regular rate and rhythm no rubs murmurs or extra sounds Lungs: Clear to auscultation, breathing unlabored, no rales or wheezes Abdomen: Positive bowel sounds, soft nontender to palpation, nondistended Extremities: No clubbing, cyanosis, or edema Skin: No evidence of breakdown, no evidence of rash    Neuro:  Comments: UEs B/L 5/5 in deltoid, biceps, triceps, grip and finger abd B/L LLE- 5/5 in HF, KE, KF, DF and PF RLE-limited by pain. Is able to ADF/PF. Sensation seems normal Skin: right leg wounds healing, dressed with foam  Assessment/Plan: 1. Functional deficits secondary to RLE tib/fib fx s/p ex-fix and ORIF_ NWB on RLE which require 3+ hours per day of interdisciplinary therapy in a comprehensive inpatient rehab setting.  Physiatrist is providing close team supervision and 24 hour management of active medical problems listed below.  Physiatrist and rehab team continue to assess barriers  to discharge/monitor patient progress toward functional and medical goals  Care Tool:  Bathing  Bathing activity did not occur:  (N/A) Body parts bathed by patient: Right arm, Left arm, Chest, Abdomen, Right upper leg, Left upper leg, Face, Front perineal area, Left lower leg   Body parts bathed by helper: Buttocks Body parts n/a: Right lower leg   Bathing assist Assist Level: Minimal Assistance - Patient > 75%     Upper Body Dressing/Undressing Upper body dressing   What is the patient wearing?: Hospital gown only    Upper body assist Assist Level: Set up assist    Lower Body Dressing/Undressing Lower body dressing      What is the patient wearing?: Incontinence brief     Lower body assist Assist for lower body dressing: Moderate Assistance - Patient 50 - 74%     Toileting Toileting Toileting Activity did not occur Landscape architect and hygiene only): N/A (no void or bm)  Toileting assist Assist for toileting: Minimal Assistance - Patient > 75%     Transfers Chair/bed transfer  Transfers assist  Chair/bed transfer activity did not occur: Safety/medical concerns (unable to maintain R LE NWB)  Chair/bed transfer assist level: 2 Helpers (A/P) Chair/bed transfer assistive device: Sliding board   Locomotion Ambulation   Ambulation assist   Ambulation activity did not occur: Safety/medical concerns (unable to maintain R LE NWB in standing)          Walk 10 feet activity   Assist  Walk 10 feet activity did not occur: Safety/medical concerns  Walk 50 feet activity   Assist Walk 50 feet with 2 turns activity did not occur: Safety/medical concerns         Walk 150 feet activity   Assist Walk 150 feet activity did not occur: Safety/medical concerns         Walk 10 feet on uneven surface  activity   Assist Walk 10 feet on uneven surfaces activity did not occur: Safety/medical concerns         Wheelchair     Assist Will  patient use wheelchair at discharge?: Yes Type of Wheelchair: Manual    Wheelchair assist level: Supervision/Verbal cueing Max wheelchair distance: 15 ft    Wheelchair 50 feet with 2 turns activity    Assist    Wheelchair 50 feet with 2 turns activity did not occur:  (decreased activity tolerance)   Assist Level: Maximal Assistance - Patient 25 - 49%   Wheelchair 150 feet activity     Assist  Wheelchair 150 feet activity did not occur:  (decreased activity tolerance)   Assist Level: Total Assistance - Patient < 25%   Blood pressure (!) 118/48, pulse 96, temperature 98.4 F (36.9 C), temperature source Oral, resp. rate 20, height 5\' 8"  (1.727 m), weight (!) 147.6 kg, SpO2 99 %.  Medical Problem List and Plan: 1.  Impaired function secondary to RLE tib/fib comminuted fxs after fall-ORIF 6/21 by Dr. Doreatha Martin is NWB on RLE             -patient may  Shower if RLE is covered             -ELOS/Goals: 14-18 days- mod I to supervision  -continue CIR PT, OT.   -will need some type of assistance at home. SW looking at options including family who might be able to be hired as aid? 2.  Antithrombotics: -DVT/anticoagulation:  Pharmaceutical: Lovenox             -antiplatelet therapy: N/A 3. Pain Management: prn oxycodone   -gabapentin 100mg  tid  -ice/heat, edmea control, reassurance 4. Mood: LCSW to follow for evaluation and support.              -antipsychotic agents: On Latuda  -evaluation pending with Dr. Sima Matas re: anxiety,? PTSD in setting of her baseline bipolar disorder   -continue trileptal and latuda per baseline   7/2 -she feels that xanax helps with anxiety but also feels that it makes her sleepy. We decided to change it to prn for now     -anxiety can be a barrier 5. Neuropsych: This patient is capable of making decisions on her own behalf. 6. Skin/Wound Care: Routine pressure relief measures.    -changed to foam dressings to RLE  -ACE wrap and elevation for  edema control.  7. Fluids/Electrolytes/Nutrition: encourage po.     7/2 -vitamin D level ordered by ortho, low--resumed 50k vit D Q week 9. H/o Nocturia/frequent UTIs: On ditropan.   -oob to void  -urine cx + 100K E Coli (ESBL)   -contact precautions   -sensitive to macrobid--->7 day rx, continent of urine 10. Constipation:    6/30: now with diarrhea- not foul smelling. Prescribed Imodium which she takes at home.   7/5 still incontinent at times, stool more formed    LOS: 9 days A FACE TO FACE EVALUATION WAS PERFORMED  Meredith Staggers 04/24/2020, 1:39 PM

## 2020-04-24 NOTE — NC FL2 (Signed)
[place] LEVEL OF CARE SCREENING TOOL     IDENTIFICATION  Patient Name: Jordan [name] Birthdate: 1966/06/09 Sex: female Admission Date (Current Location): 04/15/2020  [place] and [place] Number:  Jordan [name] [id number] [place] and Address:  [facility], [place] [address] [place],  [address]      Provider Number: [id number]  Attending Physician Name and Address:  Meredith Staggers, MD  Relative Name and Phone Number:       Current Level of Care: Hospital Recommended Level of Care: [place] Prior Approval Number:    Date Approved/Denied:   PASRR Number:    Discharge Plan: SNF    Current Diagnoses: Patient Active Problem List   Diagnosis Date Noted  . Tibia/fibula fracture, right, sequela 04/15/2020  . Closed fracture of medial malleolus of right ankle 04/10/2020  . Open fracture of distal end of fibula and tibia, right, type I or II, initial encounter 04/09/2020  . Vaginal cancer ([place]) 07/22/2019  . Morbid obesity with BMI of 40.0-44.9, adult ([place]) 06/11/2019  . 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 Visit for routine gyn exam 04/21/2019  . Vaginal atrophy 04/21/2019  . Painful lumpy right breast 07/27/2016  . Urinary tract infection 07/01/2015  . Diastolic dysfunction 38/32/9191  . Constipation 05/12/2015  . ESBL (extended spectrum beta-lactamase) producing bacteria infection 03/17/2015  . Diarrhea 03/03/2015  . Weakness 03/02/2015  . 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 Anxiety state 02/20/2015  . Claustrophobia 02/20/2015  . Transaminitis 02/18/2015  . Chest pain 02/18/2015  . Abdominal pain   . Dizziness   . Hypotension 01/04/2015  . Bipolar disorder ([place]) 06/29/2012    Orientation RESPIRATION BLADDER Height & Weight     Self, Time, Situation, Place  Normal Continent (urgency; uses purewick at night) Weight: (!) 325 lb 4.8 oz (147.6 kg) Height:  5\' 8"  (172.7 cm)  BEHAVIORAL  SYMPTOMS/MOOD NEUROLOGICAL BOWEL NUTRITION STATUS      Continent (urgency)    AMBULATORY STATUS COMMUNICATION OF NEEDS Skin   Limited Assist Verbally Other (Comment) (2 small places on bottom- mepilex and barrier cream)                       Personal Care Assistance Level of Assistance  Bathing, Dressing Bathing Assistance: Limited assistance         Functional Limitations Info             SPECIAL CARE FACTORS FREQUENCY  PT (By licensed PT), OT (By licensed OT)     PT Frequency: 5xs per week OT Frequency: 5xs per week            Contractures      Additional Factors Info                  Current Medications (04/24/2020):  This is the current hospital active medication list Current Facility-Administered Medications  Medication Dose Route Frequency Provider Last Rate Last Admin  . acetaminophen (TYLENOL) tablet 325-650 mg  325-650 mg Oral Q4H PRN Bary Leriche, PA-C   650 mg at 04/21/20 2117  . ALPRAZolam Duanne Moron) tablet 0.25 mg  0.25 mg Oral BID PRN Meredith Staggers, MD      . alum & mag hydroxide-simeth (MAALOX/MYLANTA) 200-200-20 MG/5ML suspension 30 mL  30 mL Oral Q4H PRN Love, Pamela S, PA-C      . bisacodyl (DULCOLAX) suppository 10 mg  10 mg Rectal Daily PRN Bary Leriche, PA-C      .  diphenhydrAMINE (BENADRYL) 12.5 MG/5ML elixir 12.5-25 mg  12.5-25 mg Oral Q6H PRN Love, Pamela S, PA-C      . enoxaparin (LOVENOX) injection 40 mg  40 mg Subcutaneous Q24H Love, Pamela S, PA-C   40 mg at 04/24/20 0809  . gabapentin (NEURONTIN) capsule 100 mg  100 mg Oral TID Love, Pamela S, PA-C   100 mg at 04/24/20 1352  . guaiFENesin-dextromethorphan (ROBITUSSIN DM) 100-10 MG/5ML syrup 5-10 mL  5-10 mL Oral Q6H PRN Love, Pamela S, PA-C      . loperamide (IMODIUM) capsule 2 mg  2 mg Oral PRN Raulkar, Clide Deutscher, MD   2 mg at 04/24/20 0810  . lurasidone (LATUDA) tablet 20 mg  20 mg Oral Daily Bary Leriche, PA-C   20 mg at 04/24/20 0810  . naproxen (NAPROSYN) tablet 250 mg   250 mg Oral BID WC Bary Leriche, PA-C   250 mg at 04/24/20 0810  . nitrofurantoin (macrocrystal-monohydrate) (MACROBID) capsule 100 mg  100 mg Oral Q12H Meredith Staggers, MD   100 mg at 04/24/20 0810  . OXcarbazepine (TRILEPTAL) tablet 75 mg  75 mg Oral BID Bary Leriche, PA-C   75 mg at 04/24/20 0810  . oxybutynin (DITROPAN-XL) 24 hr tablet 10 mg  10 mg Oral Daily Bary Leriche, PA-C   10 mg at 04/24/20 0810  . oxyCODONE (Oxy IR/ROXICODONE) immediate release tablet 10-15 mg  10-15 mg Oral Q4H PRN Bary Leriche, PA-C   15 mg at 04/24/20 1104  . polyethylene glycol (MIRALAX / GLYCOLAX) packet 17 g  17 g Oral Daily PRN Love, Pamela S, PA-C      . prochlorperazine (COMPAZINE) tablet 5-10 mg  5-10 mg Oral Q6H PRN Bary Leriche, PA-C   10 mg at 04/22/20 2319   Or  . prochlorperazine (COMPAZINE) injection 5-10 mg  5-10 mg Intramuscular Q6H PRN Love, Pamela S, PA-C       Or  . prochlorperazine (COMPAZINE) suppository 12.5 mg  12.5 mg Rectal Q6H PRN Love, Pamela S, PA-C      . saccharomyces boulardii (FLORASTOR) capsule 250 mg  250 mg Oral BID Meredith Staggers, MD   250 mg at 04/24/20 [phone]  . sodium phosphate (FLEET) 7-19 GM/118ML enema 1 enema  1 enema Rectal Once PRN Love, Pamela S, PA-C      . traZODone (DESYREL) tablet 25-50 mg  25-50 mg Oral QHS PRN Love, Ivan Anchors, PA-C      . Vitamin D (Ergocalciferol) (DRISDOL) capsule 50,000 Units  50,000 Units Oral Q7 days Meredith Staggers, MD   50,000 Units at 04/21/20 1414     Discharge Medications: Please see discharge summary for a list of discharge medications.  Relevant Imaging Results:  Relevant Lab Results:   Additional Information KA#:[id number]  Rana Snare, LCSW

## 2020-04-24 NOTE — Progress Notes (Signed)
Occupational Therapy Session Note  Patient Details  Name: Jordan [name] MRN: [id number] Date of Birth: April 21, 1966  Today's Date: 04/24/2020 OT Individual Time: 1448-1856 and 3149-7026 OT Individual Time Calculation (min): 73 min and 55 mins   Short Term Goals: Week 1:  OT Short Term Goal 1 (Week 1): Pt will complete a BSC or toilet transfer with 1 assist using LRAD while adhering to WB precautions OT Short Term Goal 1 - Progress (Week 1): Met OT Short Term Goal 2 (Week 1): Pt will complete 1 grooming task while sitting at the sink to increase OOB tolerance OT Short Term Goal 2 - Progress (Week 1): Met OT Short Term Goal 3 (Week 1): Pt will engage in 1 simulated IADL task w/c level with supervision assistance OT Short Term Goal 3 - Progress (Week 1): Met Week 2:  OT Short Term Goal 1 (Week 2): Pt will complete 2 BSC transfers this week with 1 person assist OT Short Term Goal 2 (Week 2): Pt will complete 1 step of toileting task OT Short Term Goal 3 (Week 2): Pt will assist with donning 25% of CAM boot  Skilled Therapeutic Interventions/Progress Updates:    Session 1: Upon entering the room, pt supine in bed with RN present giving medications. Pt reports having had recent diarrhea secondary to anxiety. She had assistance with brief change but requests to wash the rest of her body and is able to do so with set up A from bed level. OT discussing home set up and pt verbalizing she will be able to get wheelchair into bathroom to do self care tasks. OT recommended that if pt does not progress she will need to considering cleaning/dressing LB from bed level and then getting into wheelchair for UB self care and grooming at sink. Pt declines transfer secondary to concerns she will be incontinent again. Discussion of possible equipment needs based on home set up and plan for afternoon session. Pt remains in bed at end of session with call bell and all needed items within reach.   Session 2: Upon entering  the room, pt supine in bed with heating pad on R LE and pt reporting 10/10 pain. Pt is agreeable to therapy from bed level this session with focus on B UE HEP with use of level 3 theraband. Pt provided with paper handout of exercises and needing min cuing for proper technique. Pt performing 3 sets of 20 chest pulls, shoulder diagonals, shoulder elevation,bicep curls, and alternating punches with rest breaks as needed. OT continues to discuss home set up and pt reports, " I know I can't do this at home without help" and pt requesting to see if she could have rehab stay at SNF before returning home. Team notified. Call bell and all needed items within reach. Bed alarm activated.  Therapy Documentation Precautions:  Precautions Precautions: Fall Precaution Comments: has anxiety with mobility Required Braces or Orthoses: Other Brace Other Brace: CAM boot to R LE Restrictions Weight Bearing Restrictions: Yes RLE Weight Bearing: Non weight bearing  Pain: Pain Assessment Pain Scale: 0-10 Pain Score: 0-No pain ADL: ADL Eating: Not assessed Grooming: Setup Where Assessed-Grooming: Edge of bed Upper Body Bathing: Setup Where Assessed-Upper Body Bathing: Edge of bed Lower Body Bathing: Minimal assistance Where Assessed-Lower Body Bathing: Edge of bed, Bed level Upper Body Dressing: Minimal assistance Where Assessed-Upper Body Dressing: Edge of bed Lower Body Dressing: Maximal assistance Where Assessed-Lower Body Dressing: Bed level Toileting: Not assessed Toilet Transfer: Not assessed Tub/Shower Transfer:  Not assessed   Therapy/Group: Individual Therapy  Gypsy Decant 04/24/2020, 12:45 PM

## 2020-04-24 NOTE — Progress Notes (Signed)
Patient refuse SCD to left leg, states she doesn't need it, explain purpose and benefit of wearing the SCD, states she understand

## 2020-04-25 ENCOUNTER — Inpatient Hospital Stay (HOSPITAL_COMMUNITY): Payer: Medicaid Other

## 2020-04-25 ENCOUNTER — Inpatient Hospital Stay (HOSPITAL_COMMUNITY): Payer: Medicaid Other | Admitting: Occupational Therapy

## 2020-04-25 ENCOUNTER — Inpatient Hospital Stay (HOSPITAL_COMMUNITY): Payer: Medicaid Other | Admitting: Physical Therapy

## 2020-04-25 NOTE — Progress Notes (Signed)
Patient ID: Jordan [name], female   DOB: 02-11-1966, 54 y.o.   MRN: [id number]  SW faxed referral to [facility]. SW waiting on follow-up.   SW met with pt in room with friend/landord [name] to discuss SNF placement. Pt is amenable, but would like to know the cost of SNF first. SW discussed limited options with SNFs due to insurance. Pt is not interested in [facility]. SW will explore bed offer at [facility].  SW waiting on updates from [facility] on if bed offer remains.   Loralee Pacas, MSW, [place] Office: [phone] Cell: [phone] Fax: [phone]

## 2020-04-25 NOTE — Progress Notes (Signed)
Physical Therapy Session Note  Patient Details  Name: Jordan [name] MRN: [id number] Date of Birth: Feb 24, 1966  Today's Date: 04/25/2020 PT Individual Time: 1425-1503 PT Individual Time Calculation (min): 38 min   Short Term Goals: Week 2:  PT Short Term Goal 1 (Week 2): Patient will perform basic transfer with supervision for safety and set-up. PT Short Term Goal 2 (Week 2): Patient will propell w/c 50 feet in simulated household space. PT Short Term Goal 3 (Week 2): Patient will perform bed mobility with supervision consistently with use of hospital bed functions.  Skilled Therapeutic Interventions/Progress Updates:  Pt seen to make up missed time from this morning. Pt received in bed with nurse removing stitches & pt agreeable to tx. Nurse reports pt is premedicated & rest breaks provided PRN for RLE pain. Therapist re-wrapped RLE ace wrap in figure 8 pattern. Pt reports need to void so provided total assist for placing female urinal & pt with continent void. Provided pt with HEP handouts; educated pt on & pt performed the following: hip abduction squeezes, RLE hip abduction, RLE straight leg raise (AAROM), and long arc quads with cuing for technique. At end of session pt left in bed with alarm set, call bell & all needs in reach, & 4 rails up per pt request.  Therapy Documentation Precautions:  Precautions Precautions: Fall Precaution Comments: has anxiety with mobility Required Braces or Orthoses: Other Brace Other Brace: CAM boot to R LE Restrictions Weight Bearing Restrictions: Yes RLE Weight Bearing: Non weight bearing    Therapy/Group: [place] 04/25/2020, 3:14 PM

## 2020-04-25 NOTE — Progress Notes (Signed)
Sutures removed, patient tolerated well

## 2020-04-25 NOTE — Progress Notes (Signed)
[place] PHYSICAL MEDICINE & REHABILITATION PROGRESS NOTE   Subjective/Complaints: OT had just unwrapped right leg. Pt became upset when she saw swelling at foot (ACE was wrapped too tight justabove the area  ROS: Patient denies fever, rash, sore throat, blurred vision, nausea, vomiting, diarrhea, cough, shortness of breath or chest pain,     Objective:   No results found. No results for input(s): WBC, HGB, HCT, PLT in the last 72 hours. No results for input(s): NA, K, CL, CO2, GLUCOSE, BUN, CREATININE, CALCIUM in the last 72 hours.  Intake/Output Summary (Last 24 hours) at 04/25/2020 1140 Last data filed at 04/25/2020 0700 Gross per 24 hour  Intake 1300 ml  Output 750 ml  Net 550 ml     Physical Exam: Vital Signs Blood pressure 127/66, pulse 85, temperature (!) 100.4 F (38 C), temperature source Oral, resp. rate 18, height 5\' 8"  (1.727 m), weight (!) 147.6 kg, SpO2 99 %.   Constitutional: No distress . Vital signs reviewed. HEENT: EOMI, oral membranes moist Neck: supple Cardiovascular: RRR without murmur. No JVD    Respiratory/Chest: CTA Bilaterally without wheezes or rales. Normal effort    GI/Abdomen: BS +, non-tender, non-distended Ext: no clubbing, cyanosis, or edema Psych: pleasant and cooperative  Neuro:  Comments: UEs B/L 5/5 in deltoid, biceps, triceps, grip and finger abd B/L LLE- 5/5 in HF, KE, KF, DF and PF RLE-limited by pain. Is able to ADF/PF. Sensation seems normal Skin: right leg wounds all intact with sutures  Assessment/Plan: 1. Functional deficits secondary to RLE tib/fib fx s/p ex-fix and ORIF_ NWB on RLE which require 3+ hours per day of interdisciplinary therapy in a comprehensive inpatient rehab setting.  Physiatrist is providing close team supervision and 24 hour management of active medical problems listed below.  Physiatrist and rehab team continue to assess barriers to discharge/monitor patient progress toward functional and medical  goals  Care Tool:  Bathing  Bathing activity did not occur:  (N/A) Body parts bathed by patient: Right arm, Left arm, Chest, Abdomen, Right upper leg, Left upper leg, Face, Front perineal area, Left lower leg   Body parts bathed by helper: Buttocks Body parts n/a: Right lower leg   Bathing assist Assist Level: Minimal Assistance - Patient > 75%     Upper Body Dressing/Undressing Upper body dressing   What is the patient wearing?: Hospital gown only    Upper body assist Assist Level: Set up assist    Lower Body Dressing/Undressing Lower body dressing      What is the patient wearing?: Incontinence brief     Lower body assist Assist for lower body dressing: Moderate Assistance - Patient 50 - 74%     Toileting Toileting Toileting Activity did not occur Landscape architect and hygiene only): N/A (no void or bm)  Toileting assist Assist for toileting: Minimal Assistance - Patient > 75%     Transfers Chair/bed transfer  Transfers assist  Chair/bed transfer activity did not occur: Safety/medical concerns (unable to maintain R LE NWB)  Chair/bed transfer assist level: 2 Helpers (A/P) Chair/bed transfer assistive device: Sliding board   Locomotion Ambulation   Ambulation assist   Ambulation activity did not occur: Safety/medical concerns (unable to maintain R LE NWB in standing)          Walk 10 feet activity   Assist  Walk 10 feet activity did not occur: Safety/medical concerns        Walk 50 feet activity   Assist Walk 50 feet with  2 turns activity did not occur: Safety/medical concerns         Walk 150 feet activity   Assist Walk 150 feet activity did not occur: Safety/medical concerns         Walk 10 feet on uneven surface  activity   Assist Walk 10 feet on uneven surfaces activity did not occur: Safety/medical concerns         Wheelchair     Assist Will patient use wheelchair at discharge?: Yes Type of Wheelchair:  Manual    Wheelchair assist level: Supervision/Verbal cueing Max wheelchair distance: 15 ft    Wheelchair 50 feet with 2 turns activity    Assist    Wheelchair 50 feet with 2 turns activity did not occur:  (decreased activity tolerance)   Assist Level: Maximal Assistance - Patient 25 - 49%   Wheelchair 150 feet activity     Assist  Wheelchair 150 feet activity did not occur:  (decreased activity tolerance)   Assist Level: Total Assistance - Patient < 25%   Blood pressure 127/66, pulse 85, temperature (!) 100.4 F (38 C), temperature source Oral, resp. rate 18, height 5\' 8"  (1.727 m), weight (!) 147.6 kg, SpO2 99 %.  Medical Problem List and Plan: 1.  Impaired function secondary to RLE tib/fib comminuted fxs after fall-ORIF 6/21 by Dr. Doreatha Martin is NWB on RLE             -patient may  Shower if RLE is covered             -ELOS/Goals: 14-18 days- mod I to supervision  -continue CIR PT, OT.   -likely will need SNF given slow progress and insufficient supports 2.  Antithrombotics: -DVT/anticoagulation:  Pharmaceutical: Lovenox             -antiplatelet therapy: N/A 3. Pain Management: prn oxycodone   -gabapentin 100mg  tid  -ice/heat, edmea control, reassurance 4. Mood: LCSW to follow for evaluation and support.              -antipsychotic agents: On Latuda  -evaluation pending with Dr. Sima Matas re: anxiety,? PTSD in setting of her baseline bipolar disorder   -continue trileptal and latuda per baseline   7/6 anxiety remains a barrier 5. Neuropsych: This patient is capable of making decisions on her own behalf. 6. Skin/Wound Care: Routine pressure relief measures.    -changed to foam dressings to RLE  -ACE wrap and elevation for edema control.  7. Fluids/Electrolytes/Nutrition: encourage po.     7/2 -vitamin D level ordered by ortho, low--resumed 50k vit D Q week 9. H/o Nocturia/frequent UTIs: On ditropan.   -oob to void  -urine cx + 100K E Coli (ESBL),  klebsiella   -contact precautions   -sensitive to macrobid--->7 day rx, continent of urine   -low grade temp--recheck ucx 10. Constipation:    6/30: now with diarrhea- not foul smelling. Prescribed Imodium which she takes at home.   7/5 still incontinent at times, stool more formed    LOS: 10 days A FACE TO [place] 04/25/2020, 11:40 AM

## 2020-04-25 NOTE — Progress Notes (Signed)
Occupational Therapy Session Note  Patient Details  Name: Jordan [name] MRN: [id number] Date of Birth: 01/29/1966  Today's Date: 04/25/2020 OT Individual Time: 1300-1415 OT Individual Time Calculation (min): 75 min    Short Term Goals: Week 2:  OT Short Term Goal 1 (Week 2): Pt will complete 2 BSC transfers this week with 1 person assist OT Short Term Goal 2 (Week 2): Pt will complete 1 step of toileting task OT Short Term Goal 3 (Week 2): Pt will assist with donning 25% of CAM boot  Skilled Therapeutic Interventions/Progress Updates:    Pt received supine, with landlord/friend in room, helping pt to take care of paying rent, getting clothes, and discussing SNF stay. Further discussed SNF stay with pt to ease concerns and address questions. Pt reporting no pain at rest, declining to leave room. Pt transitioned to EOB with (S). Pt able to complete BUE strengthening circuit with 3 lb dowel while sitting unsupported. Pt completed 3x 20 repetitions with rest breaks between each set, performed to improve BUE strength needed for ADls and transfers. Pt agreeable to w/c transfer after much discussion and encouragement. Pt completed anterior/posterior transfer to her w/c with min A. Pt was agreeable to leave room in w/c for change of scenery for mood. Pt requested to stay away from windows 2/2 fear of heights and then after 5 min requested to return to the room. Pt transferred back to bed with a/p method, requiring assistance to lift RLE with heavy cam boot. Pt requested to remove cam boot several times but was agreeable to keep on with edu provided re purpose. Pt was left supine after rolling R/L x2 to get chuck pad positioned. NT attending to needs.   Therapy Documentation Precautions:  Precautions Precautions: Fall Precaution Comments: has anxiety with mobility Required Braces or Orthoses: Other Brace Other Brace: CAM boot to R LE Restrictions Weight Bearing Restrictions: Yes RLE Weight Bearing:  Non weight bearing   Therapy/Group: Individual Therapy  Curtis Sites 04/25/2020, 6:32 AM
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
## 2020-04-25 NOTE — Plan of Care (Signed)
  Problem: Consults Goal: RH GENERAL PATIENT EDUCATION Description: See Patient Education module for education specifics. Outcome: Progressing   Problem: RH BOWEL ELIMINATION Goal: RH STG MANAGE BOWEL WITH ASSISTANCE Description: STG Manage Bowel with mod I Assistance. Outcome: Progressing Goal: RH STG MANAGE BOWEL W/MEDICATION W/ASSISTANCE Description: STG Manage Bowel with Medication with min Assistance. Outcome: Progressing   Problem: RH BLADDER ELIMINATION Goal: RH STG MANAGE BLADDER WITH ASSISTANCE Description: STG Manage Bladder With mod I Assistance Outcome: Progressing   Problem: RH SKIN INTEGRITY Goal: RH STG MAINTAIN SKIN INTEGRITY WITH ASSISTANCE Description: STG Maintain Skin Integrity With min Assistance. Outcome: Progressing Goal: RH STG ABLE TO PERFORM INCISION/WOUND CARE W/ASSISTANCE Description: STG Able To Perform Incision/Wound Care With min Assistance. Outcome: Progressing   Problem: RH SAFETY Goal: RH STG ADHERE TO SAFETY PRECAUTIONS W/ASSISTANCE/DEVICE Description: STG Adhere to Safety Precautions With cues/reminders Assistance/Device. Outcome: Progressing   Problem: RH PAIN MANAGEMENT Goal: RH STG PAIN MANAGED AT OR BELOW PT'S PAIN GOAL Description: Less than 4 on 0-10 scale  Outcome: Progressing

## 2020-04-25 NOTE — Progress Notes (Signed)
Physical Therapy Session Note  Patient Details  Name: Jordan [name] MRN: [id number] Date of Birth: 07-Feb-1966  Today's Date: 04/25/2020 PT Individual Time: 6754-4920 PT Individual Time Calculation (min): 33 min   Short Term Goals: Week 2:  PT Short Term Goal 1 (Week 2): Patient will perform basic transfer with supervision for safety and set-up. PT Short Term Goal 2 (Week 2): Patient will propell w/c 50 feet in simulated household space. PT Short Term Goal 3 (Week 2): Patient will perform bed mobility with supervision consistently with use of hospital bed functions.  Skilled Therapeutic Interventions/Progress Updates:  Pt received in bed & agreeable to tx. Therapist dons B socks total assist for time management and cam boot total assist. Pt transfers supine>sitting EOB and therapist positions w/c in preparation for A/P transfer when pt notes need to have BM so pt returned to supine rolls L<>R with supervision & hospital bed features & therapist placed bed pan. Pt with continent BM on bed pan, requiring assistance for posterior peri hygiene, and encouragement to complete anterior peri hygiene with extra time given. Therapist assists with securing brief when pt reports need to have another BM. Therapist placed bed pan & pt left in bed with call bell in hand and instructed to notify nursing staff when finished; nurse also made aware. Pt voices frustration & stress over current situation with therapist providing encouragement.   Therapy Documentation Precautions:  Precautions Precautions: Fall Precaution Comments: has anxiety with mobility Required Braces or Orthoses: Other Brace Other Brace: CAM boot to R LE Restrictions Weight Bearing Restrictions: Yes RLE Weight Bearing: Non weight bearing General: PT Amount of Missed Time (min): 27 Minutes PT Missed Treatment Reason: Toileting  Pain: Pt reports 10/10 at times in RLE - rest breaks provided PRN and nurse administered meds at beginning of  session.    Therapy/Group: Individual Therapy  [name] 04/25/2020, 11:51 AM

## 2020-04-25 NOTE — Progress Notes (Signed)
Recollection of Urine culture  Sent to lab

## 2020-04-25 NOTE — Progress Notes (Signed)
Notified by lab urine culture need to be recollected, in gray top tube

## 2020-04-25 NOTE — Progress Notes (Signed)
Occupational Therapy Session Note  Patient Details  Name: Jordan [name] MRN: [id number] Date of Birth: [birth year]  Today's Date: 04/25/2020 OT Individual Time: 3567-0141 OT Individual Time Calculation (min): 70 min    Short Term Goals: Week 2:  OT Short Term Goal 1 (Week 2): Pt will complete 2 BSC transfers this week with 1 person assist OT Short Term Goal 2 (Week 2): Pt will complete 1 step of toileting task OT Short Term Goal 3 (Week 2): Pt will assist with donning 25% of CAM boot  Skilled Therapeutic Interventions/Progress Updates:    Upon entering the room, pt supine in bed with RN present and giving medications. Pt is agreeable to OT intervention with plan to wash again this session. Pt able to perform UB self care with set up A. Pt rolling L <> R with supervision and use of bed rail and is able to perform peri hygiene but needing assistance to wash buttocks. Pt unwilling to try herself this session. Clean brief donned with assist from therapist. Pt donning hospital gown with set up A. OT removed R LE ace wrap as physician coming into the room. Pt becoming very emotional over wrap being too tight and stitches being removed today. OT providing massage for edema management with R foot looking much better. Old dressings removed with only two being returned to skin and therapist re wrapping with ACE wrap for edema management. Pt remained in bed and continues to cry  With therapist attempting to redirect pt. Call bell and all needed items within reach upon exiting the room.  Therapy Documentation Precautions:  Precautions Precautions: Fall Precaution Comments: has anxiety with mobility Required Braces or Orthoses: Other Brace Other Brace: CAM boot to R LE Restrictions Weight Bearing Restrictions: Yes RLE Weight Bearing: Non weight bearing General: General PT Missed Treatment Reason: Toileting Vital Signs:  Pain: Pain Assessment Pain Scale: 0-10 Pain Score: 5  ADL: ADL Eating: Not  assessed Grooming: Setup Where Assessed-Grooming: Edge of bed Upper Body Bathing: Setup Where Assessed-Upper Body Bathing: Edge of bed Lower Body Bathing: Minimal assistance Where Assessed-Lower Body Bathing: Edge of bed, Bed level Upper Body Dressing: Minimal assistance Where Assessed-Upper Body Dressing: Edge of bed Lower Body Dressing: Maximal assistance Where Assessed-Lower Body Dressing: Bed level Toileting: Not assessed Toilet Transfer: Not assessed Tub/Shower Transfer: Not assessed   Therapy/Group: Individual Therapy  Gypsy Decant 04/25/2020, 12:44 PM

## 2020-04-26 ENCOUNTER — Inpatient Hospital Stay (HOSPITAL_COMMUNITY): Payer: Medicaid Other

## 2020-04-26 ENCOUNTER — Inpatient Hospital Stay (HOSPITAL_COMMUNITY): Payer: Medicaid Other | Admitting: Occupational Therapy

## 2020-04-26 ENCOUNTER — Inpatient Hospital Stay (HOSPITAL_COMMUNITY): Payer: Medicaid Other | Admitting: Physical Therapy

## 2020-04-26 DIAGNOSIS — M7989 Other specified soft tissue disorders: Secondary | ICD-10-CM

## 2020-04-26 DIAGNOSIS — R509 Fever, unspecified: Secondary | ICD-10-CM

## 2020-04-26 LAB — CBC
HCT: 33.2 % — ABNORMAL LOW (ref 36.0–46.0)
Hemoglobin: 10.6 g/dL — ABNORMAL LOW (ref 12.0–15.0)
MCH: 31.8 pg (ref 26.0–34.0)
MCHC: 31.9 g/dL (ref 30.0–36.0)
MCV: 99.7 fL (ref 80.0–100.0)
Platelets: 213 10*3/uL (ref 150–400)
RBC: 3.33 MIL/uL — ABNORMAL LOW (ref 3.87–5.11)
RDW: 14.6 % (ref 11.5–15.5)
WBC: 3.3 10*3/uL — ABNORMAL LOW (ref 4.0–10.5)
nRBC: 0 % (ref 0.0–0.2)

## 2020-04-26 LAB — BASIC METABOLIC PANEL
Anion gap: 9 (ref 5–15)
BUN: 18 mg/dL (ref 6–20)
CO2: 27 mmol/L (ref 22–32)
Calcium: 8.9 mg/dL (ref 8.9–10.3)
Chloride: 102 mmol/L (ref 98–111)
Creatinine, Ser: 0.79 mg/dL (ref 0.44–1.00)
GFR calc Af Amer: >60 mL/min (ref >60)
GFR calc non Af Amer: >60 mL/min (ref >60)
Glucose, Bld: 89 mg/dL (ref 70–99)
Potassium: 4.4 mmol/L (ref 3.5–5.1)
Sodium: 138 mmol/L (ref 135–145)

## 2020-04-26 MED ORDER — VITAMIN D 25 MCG (1000 UNIT) PO TABS
1000.0000 [IU] | ORAL_TABLET | Freq: Every day | ORAL | Status: DC
  Administered 2020-04-26 – 2020-05-04 (×9): 1000 [IU] via ORAL
  Filled 2020-04-26 (×9): qty 1

## 2020-04-26 NOTE — Progress Notes (Signed)
Occupational Therapy Session Note  Patient Details  Name: Jordan [name] MRN: [id number] Date of Birth: Jul 27, 1966  Today's Date: 04/26/2020 OT Individual Time: 4098-1191 OT Individual Time Calculation (min): 73 min    Short Term Goals: Week 2:  OT Short Term Goal 1 (Week 2): Pt will complete 2 BSC transfers this week with 1 person assist OT Short Term Goal 2 (Week 2): Pt will complete 1 step of toileting task OT Short Term Goal 3 (Week 2): Pt will assist with donning 25% of CAM boot  Skilled Therapeutic Interventions/Progress Updates:    Upon entering the room, pt supine in bed with staff finishing up x rays of R LE while therapist gathering needed items in room. Pt requesting to wash again this session and wanting to try some clothing a friend brought her. OT encouraged pt to dress in normal clothing for practice. OT recommended pt may like to wear longer dresses at home for ease with toileting/hygiene. Pt verbalized understanding. Pt bathing with set up A except needing assistance for buttocks and pt declines to attempt. Pt able to pull R LE into circle sitting in bed for hygiene on her own. OT encouraged pt to place female urinal when she reports urgency for toileting this session. She was able to place and hold but then did not void. Pt rolling L <> R with supervision and use of bed rails. Pt donning pull over dress with set up A this session. Pt remained in bed at end of session with call bell and all needed items within reach.   Therapy Documentation Precautions:  Precautions Precautions: Fall Precaution Comments: has anxiety with mobility Required Braces or Orthoses: Other Brace Other Brace: CAM boot to R LE Restrictions Weight Bearing Restrictions: Yes RLE Weight Bearing: Non weight bearing ADL: ADL Eating: Not assessed Grooming: Setup Where Assessed-Grooming: Edge of bed Upper Body Bathing: Setup Where Assessed-Upper Body Bathing: Edge of bed Lower Body Bathing: Minimal  assistance Where Assessed-Lower Body Bathing: Edge of bed, Bed level Upper Body Dressing: Minimal assistance Where Assessed-Upper Body Dressing: Edge of bed Lower Body Dressing: Maximal assistance Where Assessed-Lower Body Dressing: Bed level Toileting: Not assessed Toilet Transfer: Not assessed Tub/Shower Transfer: Not assessed   Therapy/Group: Individual Therapy  Gypsy Decant 04/26/2020, 12:56 PM

## 2020-04-26 NOTE — Plan of Care (Signed)
  Problem: RH BOWEL ELIMINATION Goal: RH STG MANAGE BOWEL WITH ASSISTANCE Description: STG Manage Bowel with mod I Assistance. Outcome: Progressing   Problem: RH BLADDER ELIMINATION Goal: RH STG MANAGE BLADDER WITH ASSISTANCE Description: STG Manage Bladder With mod I Assistance Outcome: Progressing   Problem: RH SKIN INTEGRITY Goal: RH STG MAINTAIN SKIN INTEGRITY WITH ASSISTANCE Description: STG Maintain Skin Integrity With min Assistance. Outcome: Progressing   Problem: RH SAFETY Goal: RH STG ADHERE TO SAFETY PRECAUTIONS W/ASSISTANCE/DEVICE Description: STG Adhere to Safety Precautions With cues/reminders Assistance/Device. Outcome: Progressing

## 2020-04-26 NOTE — Progress Notes (Signed)
Patient ID: Jordan [name], female   DOB: Feb 28, 1966, 54 y.o.   MRN: [id number]  SW spoke with [place]/State reviewer for [facility] to complete assessment for pt. SW met with pt in room and explained process. SW waiting on [id number]#. SW waiting on updates from [facility] on bed offer.    Loralee Pacas, MSW, [place] Office: [phone] Cell: [phone] Fax: [phone]
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
## 2020-04-26 NOTE — Progress Notes (Signed)
Physical Therapy Session Note  Patient Details  Name: Jordan [name] MRN: [id number] Date of Birth: August 13, 1966  Today's Date: 04/26/2020 PT Individual Time: 1050-1158 and 1406-1500 PT Individual Time Calculation (min): 68 min and 54 min  Short Term Goals: Week 2:  PT Short Term Goal 1 (Week 2): Patient will perform basic transfer with supervision for safety and set-up. PT Short Term Goal 2 (Week 2): Patient will propell w/c 50 feet in simulated household space. PT Short Term Goal 3 (Week 2): Patient will perform bed mobility with supervision consistently with use of hospital bed functions.  Skilled Therapeutic Interventions/Progress Updates:  Treatment 1: Pt received in bed with ortho PA present. Therapist along with PA educated pt on NWB and methods of attempting standing on LLE only. Therapist ace wrapped RLE after PA removed sutures. Pt willing to transfer OOB but requests 2nd person present to stabilize w/c. Pt completes A/P transfer bed<>w/c & requests therapist support RLE; pt is able to complete transfer with min assist overall with extra time given. Transported pt out of room for change of scenery but pt not willing to go into gym 2/2 open blinds & fear of heights. Pt requests to purchase items from vending machine & does so with therapist retrieving items from low height. Pt propels w/c ~20 ft with BUE & supervision with encouragement. At end of session pt assisted back to bed & encouraged to perform HEP during the day. Pt left in bed with alarm set, 4 rails up per pt request, and call bell & all needs in reach. Pain: "it's good now"  Treatment 2: Pt received in bed & agreeable to tx. Therapist donned cam boot total assist & pt transferred supine<>sit with supervision & hospital bed features. Pt requests 2nd person assist with STS attempt & NT does so. Pt attempts STS from EOB with RW with therapist holding RLE out to prevent pt from weight bearing through extremity. Pt is able to initiate  buttocks clearance from seat but unable to fully clear buttocks to achieve standing. Pt then engages in 4# weighted bar ball taps for 1 min 30 seconds + 1 minute for BUE strengthening & cardiopulmonary endurance training. Sitting EOB pt performs RLE long arc quads with 5 second hold, seated hip flexion, seated knee flexion with green theraband for resistance, and 4# weighted bar bicep curls all with cuing for proper technique from therapist. Pt returned to supine and therapist doffed cam boot total assist. Pt changed from dress to hospital gown. Pt left in bed with alarm set, 4 rails up 2/2 pt preference, and call bell/all needs in reach. Pain: 0/10 Pt reports she noticed some drainage from RLE today when nurse was checking her wound around lunch & pt requested ortho PA [name]) be notified - therapist sent her a private message re: issue through [facility].   Therapy Documentation Precautions:  Precautions Precautions: Fall Precaution Comments: has anxiety with mobility Required Braces or Orthoses: Other Brace Other Brace: CAM boot to R LE Restrictions Weight Bearing Restrictions: Yes RLE Weight Bearing: Non weight bearing      Therapy/Group: Individual Therapy  [name] 04/26/2020, 3:11 PM

## 2020-04-26 NOTE — Progress Notes (Signed)
Lower extremity venous has been completed.   Preliminary results in CV Proc.   Abram Sander 04/26/2020 1:31 PM

## 2020-04-26 NOTE — Progress Notes (Signed)
This chaplain was present for F/U spiritual care with the Pt.  The chaplain listened as the Pt. shared the events of the day and her upcoming medical decisions.  The chaplain recognizes the Pt. greets me when I arrive and then follows up with kind or connecting words.  F/U spiritual care is available as needed.

## 2020-04-26 NOTE — Progress Notes (Signed)
Orthopaedic Trauma Progress Note  S: Doing fairly well.  Swelling in right lower extremity improving.  Pain has been manageable.  No specific concerns or complaints.  About to work with physical therapy.  All sutures have been removed from right lower extremity  O:  General: Sitting up in bed, NAD RLE: CAM boot about to be placed on patient. Dressing  All incisions clean, dry, intact. Traumatic wound to anterior tibia with some darkening of the skin edges, which is not unexpected but there is no drainage and no signs of infection.  Imaging: Stable post op imaging   Assessment: 54 year old female s/p fall Injuries: Right type 2 open tibia fracture s/p I&D and ORIF  Plan: Weightbearing: NWB RLE Incisional and dressing care: Dressing change PRN Orthopedic device(s): Cam walking boot. Okay to come out of boot at rest to begin gentle ankle motion. Should wear boot anytime she is up with therapies VTE prophylaxis: Lovenox 40 mg daily until 05/11/20 Impediments to Fracture Healing: Open fracture. Vit D level 22, start on D3 1000 units daily Dispo: Continue with therapies. Repeat tibia/ankle imaging done today appears stable.  No signs of hardware failure loosening.  We will keep a close eye on RLE traumatic wound.  If any drainage is noted from the wound please contact OTS immediately Follow - up plan: 2 weeks after hospital discharge   Prisca Gearing A. [facility] [phone] (office) [email or web address]

## 2020-04-26 NOTE — Progress Notes (Signed)
[facility] PHYSICAL MEDICINE & REHABILITATION PROGRESS NOTE   Subjective/Complaints: No new issues overnight  Objective:   No results found. Recent Labs    04/26/20 0530  WBC 3.3*  HGB 10.6*  HCT 33.2*  PLT 213   Recent Labs    04/26/20 0530  NA 138  K 4.4  CL 102  CO2 27  GLUCOSE 89  BUN 18  CREATININE 0.79  CALCIUM 8.9    Intake/Output Summary (Last 24 hours) at 04/26/2020 0825 Last data filed at 04/26/2020 7510 Gross per 24 hour  Intake 540 ml  Output 1400 ml  Net -860 ml     Physical Exam: Vital Signs Blood pressure (!) 101/48, pulse 81, temperature 97.8 F (36.6 C), temperature source Oral, resp. rate 17, height 5\' 8"  (1.727 m), weight (!) 147.6 kg, SpO2 99 %.   Constitutional: No distress . Vital signs reviewed. HEENT: EOMI, oral membranes moist Neck: supple Cardiovascular: RRR without murmur. No JVD    Respiratory/Chest: CTA Bilaterally without wheezes or rales. Normal effort    GI/Abdomen: BS +, non-tender, non-distended Ext: no clubbing, cyanosis, or edema Psych: pleasant and anxious.  Neuro:  Comments: UEs B/L 5/5 in deltoid, biceps, triceps, grip and finger abd B/L LLE- 5/5 in HF, KE, KF, DF and PF RLE-limited by pain. Is able to ADF/PF. Sensation seems normal Skin: right leg wounds all intact    Assessment/Plan: 1. Functional deficits secondary to RLE tib/fib fx s/p ex-fix and ORIF_ NWB on RLE which require 3+ hours per day of interdisciplinary therapy in a comprehensive inpatient rehab setting.  Physiatrist is providing close team supervision and 24 hour management of active medical problems listed below.  Physiatrist and rehab team continue to assess barriers to discharge/monitor patient progress toward functional and medical goals  Care Tool:  Bathing  Bathing activity did not occur:  (N/A) Body parts bathed by patient: Right arm, Left arm, Chest, Abdomen, Right upper leg, Left upper leg, Face, Front perineal area, Left lower leg   Body  parts bathed by helper: Buttocks Body parts n/a: Right lower leg   Bathing assist Assist Level: Minimal Assistance - Patient > 75%     Upper Body Dressing/Undressing Upper body dressing   What is the patient wearing?: Hospital gown only    Upper body assist Assist Level: Set up assist    Lower Body Dressing/Undressing Lower body dressing      What is the patient wearing?: Incontinence brief     Lower body assist Assist for lower body dressing: Moderate Assistance - Patient 50 - 74%     Toileting Toileting Toileting Activity did not occur Landscape architect and hygiene only): N/A (no void or bm)  Toileting assist Assist for toileting: Minimal Assistance - Patient > 75%     Transfers Chair/bed transfer  Transfers assist  Chair/bed transfer activity did not occur: Safety/medical concerns (unable to maintain R LE NWB)  Chair/bed transfer assist level: 2 Helpers (A/P) Chair/bed transfer assistive device: Sliding board   Locomotion Ambulation   Ambulation assist   Ambulation activity did not occur: Safety/medical concerns (unable to maintain R LE NWB in standing)          Walk 10 feet activity   Assist  Walk 10 feet activity did not occur: Safety/medical concerns        Walk 50 feet activity   Assist Walk 50 feet with 2 turns activity did not occur: Safety/medical concerns         Walk 150  feet activity   Assist Walk 150 feet activity did not occur: Safety/medical concerns         Walk 10 feet on uneven surface  activity   Assist Walk 10 feet on uneven surfaces activity did not occur: Safety/medical concerns         Wheelchair     Assist Will patient use wheelchair at discharge?: Yes Type of Wheelchair: Manual    Wheelchair assist level: Supervision/Verbal cueing Max wheelchair distance: 15 ft    Wheelchair 50 feet with 2 turns activity    Assist    Wheelchair 50 feet with 2 turns activity did not occur:   (decreased activity tolerance)   Assist Level: Maximal Assistance - Patient 25 - 49%   Wheelchair 150 feet activity     Assist  Wheelchair 150 feet activity did not occur:  (decreased activity tolerance)   Assist Level: Total Assistance - Patient < 25%   Blood pressure (!) 101/48, pulse 81, temperature 97.8 F (36.6 C), temperature source Oral, resp. rate 17, height 5\' 8"  (1.727 m), weight (!) 147.6 kg, SpO2 99 %.  Medical Problem List and Plan: 1.  Impaired function secondary to RLE tib/fib comminuted fxs after fall-ORIF 6/21 by Dr. Doreatha Martin is NWB on RLE             -patient may  Shower if RLE is covered             -ELOS/Goals: 14-18 days- mod I to supervision  -continue CIR PT, OT.   -likely will need SNF given slow progress and insufficient supports 2.  Antithrombotics: -DVT/anticoagulation:  Pharmaceutical: Lovenox  -checking dopplers given low grade temp             -antiplatelet therapy: N/A 3. Pain Management: prn oxycodone   -gabapentin 100mg  tid  -ice/heat, edmea control, reassurance 4. Mood: LCSW to follow for evaluation and support.              -antipsychotic agents: On Latuda  -evaluation pending with Dr. Sima Matas re: anxiety,? PTSD in setting of her baseline bipolar disorder   -continue trileptal and latuda per baseline   7/6 anxiety remains a barrier 5. Neuropsych: This patient is capable of making decisions on her own behalf. 6. Skin/Wound Care: Routine pressure relief measures.    -changed to foam dressings to RLE  -ACE wrap and elevation for edema control.  7. Fluids/Electrolytes/Nutrition: encourage po.     7/2 -vitamin D level ordered by ortho, low--resumed 50k vit D Q week 9. H/o Nocturia/frequent UTIs: On ditropan.   -oob to void  -urine cx + 100K E Coli (ESBL), klebsiella   -contact precautions   -sensitive to macrobid--->7 day rx, continent of urine   -low grade temp--recheck of ucx sent 10. Constipation:    6/30: now with diarrhea- not foul  smelling. Prescribed Imodium which she takes at home.   7/6 moving bowels, more formed and continent   LOS: 11 days A FACE TO [place] 04/26/2020, 8:25 AM

## 2020-04-27 ENCOUNTER — Inpatient Hospital Stay (HOSPITAL_COMMUNITY): Payer: Medicaid Other

## 2020-04-27 ENCOUNTER — Inpatient Hospital Stay (HOSPITAL_COMMUNITY): Payer: Medicaid Other | Admitting: Occupational Therapy

## 2020-04-27 ENCOUNTER — Ambulatory Visit: Payer: Medicaid Other

## 2020-04-27 ENCOUNTER — Inpatient Hospital Stay (HOSPITAL_COMMUNITY): Payer: Medicaid Other | Admitting: *Deleted

## 2020-04-27 NOTE — Progress Notes (Signed)
Occupational Therapy Session Note  Patient Details  Name: Jordan [name] MRN: [id number] Date of Birth: 1966-03-11  Today's Date: 04/27/2020 OT Individual Time: 1735-6701 OT Individual Time Calculation (min): 54 min    Short Term Goals: Week 2:  OT Short Term Goal 1 (Week 2): Pt will complete 2 BSC transfers this week with 1 person assist OT Short Term Goal 2 (Week 2): Pt will complete 1 step of toileting task OT Short Term Goal 3 (Week 2): Pt will assist with donning 25% of CAM boot  Skilled Therapeutic Interventions/Progress Updates:    Upon entering the room, pt sitting up in bed with nurse tech present taking morning vitals which were all WNLs. Pt reports feeling well with no c/o pain and agreeable to OT intervention. OT providing assistance with changing foam dressing and wrapping R LE for edema management this session. No drainage noted from where stitches were removed. Pt bathing self with setup A and assist to wash buttocks only this session. Pt donning pull over dress with set up A and reports feeling much better when having on "normal" clothing. Pt does request to use female urinal this session which therapist encouraged pt to place herself. She was able to do so but then did not void. Pt appeared more anxious this session as she wanted to make sure she finished self care tasks before here telehealth visit with physician at 915. Pt requesting to remain in bed at end of session all needs within reach.   Therapy Documentation Precautions:  Precautions Precautions: Fall Precaution Comments: has anxiety with mobility Required Braces or Orthoses: Other Brace Other Brace: CAM boot to R LE Restrictions Weight Bearing Restrictions: Yes RLE Weight Bearing: Non weight bearing Vital Signs: Therapy Vitals Temp: 97.7 F (36.5 C) Temp Source: Oral Pulse Rate: 82 Resp: 16 BP: 117/65 Patient Position (if appropriate): Lying Oxygen Therapy SpO2: 100 % O2 Device: Room Air Pain: Pain  Assessment Pain Score: Asleep ADL: ADL Eating: Not assessed Grooming: Setup Where Assessed-Grooming: Edge of bed Upper Body Bathing: Setup Where Assessed-Upper Body Bathing: Edge of bed Lower Body Bathing: Minimal assistance Where Assessed-Lower Body Bathing: Edge of bed, Bed level Upper Body Dressing: Minimal assistance Where Assessed-Upper Body Dressing: Edge of bed Lower Body Dressing: Maximal assistance Where Assessed-Lower Body Dressing: Bed level Toileting: Not assessed Toilet Transfer: Not assessed Tub/Shower Transfer: Not assessed   Therapy/Group: Individual Therapy  Gypsy Decant 04/27/2020, 8:56 AM

## 2020-04-27 NOTE — Progress Notes (Signed)
[place] PHYSICAL MEDICINE & REHABILITATION PROGRESS NOTE   Subjective/Complaints: Pt denies new problems last night. RLE still painful but trying to work through it with therapies. Happy that she can have boot off in bed.    ROS: Patient denies fever, rash, sore throat, blurred vision, nausea, vomiting, diarrhea, cough, shortness of breath or chest pain,  headache, or mood change.    Objective:   DG Tibia/Fibula Right Port  Result Date: 04/26/2020 CLINICAL DATA:  Status post open reduction and internal fixation of right tibial and fibular fractures. EXAM: PORTABLE RIGHT TIBIA AND FIBULA - 2 VIEW COMPARISON:  April 10, 2020. FINDINGS: Status post surgical internal fixation of comminuted and displaced distal right tibial fracture. Good alignment of fracture components is noted. Continued presence of mildly displaced fractures involving the proximal and distal fibular shafts. IMPRESSION: Status post surgical internal fixation of comminuted and displaced distal right tibial fracture. Continued presence of mildly displaced fractures involving proximal and distal fibular shafts. Electronically Signed   By: Marijo Conception M.D.   On: 04/26/2020 09:05   DG Ankle Right Port  Result Date: 04/26/2020 CLINICAL DATA:  Status post right foreleg ORIF EXAM: PORTABLE RIGHT ANKLE - 2 VIEW COMPARISON:  Findings are correlated with two view radiograph the right foreleg completed concurrently FINDINGS: Three view radiograph right ankle demonstrates a a comminuted distal tibial metadiaphyseal fracture status post ORIF utilizing a medial cortical plate and multiple compression screws. There is approximately 5 mm override, 1 cortical width posterior displacement, and minimal anterior angulation of the distal fracture fragment. There is persistent minimal widening of the a lateral aspect of the tibiotalar joint space involving the a tibial plafond and lateral mortise. No acute fracture or dislocation. Residual screw holes  noted within the calcaneus. Vascular calcifications are noted. IMPRESSION: Right ankle ORIF as described above. Electronically Signed   By: Fidela Salisbury MD   On: 04/26/2020 09:13   VAS [name] LOWER EXTREMITY VENOUS (DVT)  Result Date: 04/26/2020  Lower Venous DVTStudy Indications: Fever, and Swelling.  Comparison Study: no prior Performing Technologist: Abram Sander RVS  Examination Guidelines: A complete evaluation includes B-mode imaging, spectral Doppler, color Doppler, and power Doppler as needed of all accessible portions of each vessel. Bilateral testing is considered an integral part of a complete examination. Limited examinations for reoccurring indications may be performed as noted. The reflux portion of the exam is performed with the patient in reverse Trendelenburg.  +---------+---------------+---------+-----------+----------+--------------+ RIGHT    CompressibilityPhasicitySpontaneityPropertiesThrombus Aging +---------+---------------+---------+-----------+----------+--------------+ CFV      Full           Yes      Yes                                 +---------+---------------+---------+-----------+----------+--------------+ SFJ      Full                                                        +---------+---------------+---------+-----------+----------+--------------+ FV Prox  Full                                                        +---------+---------------+---------+-----------+----------+--------------+  FV Mid   Full                                                        +---------+---------------+---------+-----------+----------+--------------+ FV DistalFull                                                        +---------+---------------+---------+-----------+----------+--------------+ PFV      Full                                                        +---------+---------------+---------+-----------+----------+--------------+ POP      Full            Yes      Yes                                 +---------+---------------+---------+-----------+----------+--------------+ PTV      Full                                                        +---------+---------------+---------+-----------+----------+--------------+ PERO                                                  Not visualized +---------+---------------+---------+-----------+----------+--------------+   +---------+---------------+---------+-----------+----------+--------------+ LEFT     CompressibilityPhasicitySpontaneityPropertiesThrombus Aging +---------+---------------+---------+-----------+----------+--------------+ CFV      Full           Yes      Yes                                 +---------+---------------+---------+-----------+----------+--------------+ SFJ      Full                                                        +---------+---------------+---------+-----------+----------+--------------+ FV Prox  Full                                                        +---------+---------------+---------+-----------+----------+--------------+ FV Mid   Full                                                        +---------+---------------+---------+-----------+----------+--------------+  FV Distal               Yes      Yes                                 +---------+---------------+---------+-----------+----------+--------------+ PFV      Full                                                        +---------+---------------+---------+-----------+----------+--------------+ POP      Full           Yes      Yes                                 +---------+---------------+---------+-----------+----------+--------------+ PTV      Full                                                        +---------+---------------+---------+-----------+----------+--------------+ PERO                                                  Not  visualized +---------+---------------+---------+-----------+----------+--------------+     Summary: BILATERAL: - No evidence of deep vein thrombosis seen in the lower extremities, bilaterally. -No evidence of popliteal cyst, bilaterally.   *See table(s) above for measurements and observations. Electronically signed by Harold Barban MD on 04/26/2020 at 5:10:09 PM.    Final    Recent Labs    04/26/20 0530  WBC 3.3*  HGB 10.6*  HCT 33.2*  PLT 213   Recent Labs    04/26/20 0530  NA 138  K 4.4  CL 102  CO2 27  GLUCOSE 89  BUN 18  CREATININE 0.79  CALCIUM 8.9    Intake/Output Summary (Last 24 hours) at 04/27/2020 0806 Last data filed at 04/26/2020 1900 Gross per 24 hour  Intake 600 ml  Output 200 ml  Net 400 ml     Physical Exam: Vital Signs Blood pressure 119/73, pulse 92, temperature 99.9 F (37.7 C), temperature source Oral, resp. rate 18, height 5\' 8"  (1.727 m), weight (!) 147.6 kg, SpO2 98 %.   Constitutional: No distress . Vital signs reviewed. HEENT: EOMI, oral membranes moist Neck: supple Cardiovascular: RRR without murmur. No JVD    Respiratory/Chest: CTA Bilaterally without wheezes or rales. Normal effort    GI/Abdomen: BS +, non-tender, non-distended Ext: no clubbing, cyanosis, or edema Psych: pleasant and anxious  Neuro:  Comments: UEs B/L 5/5 in deltoid, biceps, triceps, grip and finger abd B/L LLE- 5/5 in HF, KE, KF, DF and PF RLE-limited by pain. Is able to ADF/PF. Sensation seems normal Skin: right leg wounds all clean, traumatic wound appears clean with eschar.   Assessment/Plan: 1. Functional deficits secondary to RLE tib/fib fx s/p ex-fix and ORIF_ NWB on RLE which require 3+ hours per day of interdisciplinary therapy in a comprehensive inpatient rehab setting.  Physiatrist  is providing close team supervision and 24 hour management of active medical problems listed below.  Physiatrist and rehab team continue to assess barriers to discharge/monitor  patient progress toward functional and medical goals  Care Tool:  Bathing  Bathing activity did not occur:  (N/A) Body parts bathed by patient: Right arm, Left arm, Chest, Abdomen, Right upper leg, Left upper leg, Face, Front perineal area, Left lower leg, Right lower leg   Body parts bathed by helper: Buttocks Body parts n/a: Right lower leg   Bathing assist Assist Level: Minimal Assistance - Patient > 75%     Upper Body Dressing/Undressing Upper body dressing   What is the patient wearing?: Dress    Upper body assist Assist Level: Set up assist    Lower Body Dressing/Undressing Lower body dressing      What is the patient wearing?: Incontinence brief     Lower body assist Assist for lower body dressing: Moderate Assistance - Patient 50 - 74%     Toileting Toileting Toileting Activity did not occur Landscape architect and hygiene only): N/A (no void or bm)  Toileting assist Assist for toileting: Minimal Assistance - Patient > 75%     Transfers Chair/bed transfer  Transfers assist  Chair/bed transfer activity did not occur: Safety/medical concerns (unable to maintain R LE NWB)  Chair/bed transfer assist level: 2 Helpers (A/P method, min assist for RLE + 2nd person to stabilize w/c) Chair/bed transfer assistive device: Sliding board (connecting w/c & bed)   Locomotion Ambulation   Ambulation assist   Ambulation activity did not occur: Safety/medical concerns (unable to maintain R LE NWB in standing)          Walk 10 feet activity   Assist  Walk 10 feet activity did not occur: Safety/medical concerns        Walk 50 feet activity   Assist Walk 50 feet with 2 turns activity did not occur: Safety/medical concerns         Walk 150 feet activity   Assist Walk 150 feet activity did not occur: Safety/medical concerns         Walk 10 feet on uneven surface  activity   Assist Walk 10 feet on uneven surfaces activity did not occur:  Safety/medical concerns         Wheelchair     Assist Will patient use wheelchair at discharge?: Yes Type of Wheelchair: Manual    Wheelchair assist level: Supervision/Verbal cueing Max wheelchair distance: 20 ft    Wheelchair 50 feet with 2 turns activity    Assist    Wheelchair 50 feet with 2 turns activity did not occur:  (decreased activity tolerance)   Assist Level: Maximal Assistance - Patient 25 - 49%   Wheelchair 150 feet activity     Assist  Wheelchair 150 feet activity did not occur:  (decreased activity tolerance)   Assist Level: Total Assistance - Patient < 25%   Blood pressure 119/73, pulse 92, temperature 99.9 F (37.7 C), temperature source Oral, resp. rate 18, height 5\' 8"  (1.727 m), weight (!) 147.6 kg, SpO2 98 %.  Medical Problem List and Plan: 1.  Impaired function secondary to RLE tib/fib comminuted fxs after fall-ORIF 6/21 by Dr. Doreatha Martin is NWB on RLE             -patient may  Shower if RLE is covered             -ELOS/Goals: 14-18 days- mod I to supervision  -continue CIR  PT, OT.   -SNF pending 2.  Antithrombotics: -DVT/anticoagulation:  Pharmaceutical: Lovenox  -dopplers clear             -antiplatelet therapy: N/A 3. Pain Management: prn oxycodone   -gabapentin 100mg  tid  -ice/heat, edmea control, reassurance 4. Mood: LCSW to follow for evaluation and support.              -antipsychotic agents: On Latuda  -evaluation pending with Dr. Sima Matas re: anxiety,? PTSD in setting of her baseline bipolar disorder   -continue trileptal and latuda per baseline   7/6-8 anxiety remains a barrier at times 5. Neuropsych: This patient is capable of making decisions on her own behalf. 6. Skin/Wound Care: Routine pressure relief measures.    -changed to foam dressings to RLE  -ACE wrap and elevation for edema control.  7. Fluids/Electrolytes/Nutrition: encourage po.     7/2 -vitamin D level ordered by ortho, low--resumed 50k vit D Q week 9.  H/o Nocturia/frequent UTIs: On ditropan.   -oob to void  -urine cx + 100K E Coli (ESBL), klebsiella   -contact precautions   -sensitive to macrobid--->7 day rx, continent of urine   -low grade temp--recheck of ucx sent and pending    -RLE wounds all clean 10. Constipation:    6/30: now with diarrhea- not foul smelling. Prescribed Imodium which she takes at home.   7/6 moving bowels, more formed and continent   LOS: 12 days A FACE TO [place] 04/27/2020, 8:06 AM

## 2020-04-27 NOTE — Progress Notes (Signed)
Ortho Trauma  Wound appears stable. Pinpoint drainage on bandage. No significant complaints. Okay to leave wounds uncovered in bed with boot off but would dress wounds prior to putting boot on. Will check wounds early next week. Call if there is continued drainage.  Shona Needles, MD [facility] [phone] (office) [email or web address]

## 2020-04-27 NOTE — Progress Notes (Signed)
Physical Therapy Session Note  Patient Details  Name: Jordan [name] MRN: [id number] Date of Birth: 1966/02/17  Today's Date: 04/27/2020 PT Individual Time: 1335-1450 and 1100-1200 PT Individual Time Calculation (min): 75 min and 60 min  Short Term Goals: Week 1:  PT Short Term Goal 1 (Week 1): Patient will perform bed mobility with supervision without use of bed rails. PT Short Term Goal 1 - Progress (Week 1): Progressing toward goal PT Short Term Goal 2 (Week 1): Patient will perform basic transfers using LRAD with min A while maintaining R LE NWB precautions. PT Short Term Goal 2 - Progress (Week 1): Met PT Short Term Goal 3 (Week 1): Patient will propel the w/c >50 ft with supervision. PT Short Term Goal 3 - Progress (Week 1): Progressing toward goal Week 2:  PT Short Term Goal 1 (Week 2): Patient will perform basic transfer with supervision for safety and set-up. PT Short Term Goal 2 (Week 2): Patient will propell w/c 50 feet in simulated household space. PT Short Term Goal 3 (Week 2): Patient will perform bed mobility with supervision consistently with use of hospital bed functions. Week 3:     Skilled Therapeutic Interventions/Progress Updates:    AM session PAIN 4/10 R ankle w/therex, rest breaks as needed. Pt initially supine and states she can not work w/PT bc RLE dressings not in place.  Therapist offered to apply dressing and pt refused stating only the nurses could do this.  Nursing notified.   Pt then requested assistance to use urinal.  Set up assist w/positioining and holding urinal/pt continent of bladder, requires assist to manage brief.  Pt educated re: now clear for gentle ROM.  Pt then performed the following AROM to tolerance: Ankle DF/PF x15 Inversion/Eversion x 15 Drawing numbers 1-10 w/foot/ankle repeated x 2.    Nursing in to apply dressing, acewrap and application of boot performed by therapist. Pt then requests pain meds/nursing provided during  session. Supine to sit on EOB w/supervision, pt requested assist to manage RLE but encouraged to and performed without this assist without difficulty. wc set up and pt refused to transfer without nursing assisting w/RLE therapist stabilizing wc.  Pt then transfers to wc without additional assist, moves slowly but demonstrates clear ability to boost and clear when focused on task/exerting full effort.  Pt transported to hallway. Pt propells wc total distance of 71f w/mult rest breaks, occasional min assist on L due to difficulty reaching wheel.  Pt then returned to room and agreed to remain oob in wc until next session. Pt left oob in wc w/alarm belt set and needs in reach  However, as therapist left room overheard pt calling nursing stating she needed to get back in bed.    Pm session: 5-6/10 r ankle, treatment to tolerance, rest breaks as needed, repositioning as needed. Supine to sit w/supervision using rails. Pt states she cannot perform without assist to manage RLE.  Pt assured she can and encouraged to attempt.  Pt then performs post scoot bed to wc w/assist only to stabilize wc, additional time   Pt transported to hall outside gym for UE strengthening w/4.b bar 2x10 overhead raise Pt stated she felt anxious and requested to go to room  Encouraged pt to continue w/session.  Declined parallel bar standing trials again citing anxiety. Pt agreed to attempt UE ergometer if therapist would close all blinds in gym Pt set up on UE ergometer resistance at 2, performed x 5 min at RPE 5/10 then  stated she could see past the blinds and could not cope with being in gym any longer.   Pt transported to room.    Set up for transfer to bed by therapist.  Pt able to lift legs onto bed w/encouragement to do so, no assist needed.    Pt refused to attempt transfer without second person despite encouragment to attempt and assurance that she was physically capable.  NT in to guide RLE and pt transfers wc to  bed via ant approach w/ tactile guidance of RLE only.  Sit to supine using rails/hob somewhat elevated.  Pt scoots and rolls in bed w/ mod I.  Pt rested in supine for 3-4 min, received pain meds during this time. Supine to sit on edge of bed w/use of rails only.    Supine therex including 2x20 Hip abd/add TKEs Heel slides Glut sets  Supine to sit on edge mod I  Seated therex including 2x20 LAQs Marching R ankle drawing numbers 1-10 w/cues but pt rushes thru exercises w/poor form despite cues. Pt requested to change out of dress and into hospital gown.  Performs w/set up only while seated on edge of bed. Sit to supine mod I. Pt rills mod I for repositioning of chuck pad in bed.  Pt left supine w/rails up x 4, alarm set, bed in lowest position, and needs in reach.  Fuctional moblitiy training primarily limited by pt self limiting behavior.     Therapy Documentation Precautions:  Precautions Precautions: Fall Precaution Comments: has anxiety with mobility Required Braces or Orthoses: Other Brace Other Brace: CAM boot to R LE Restrictions Weight Bearing Restrictions: Yes RLE Weight Bearing: Non weight bearing   Therapy/Group: Individual Therapy  Callie Fielding, [place] 04/27/2020, 3:50 PM

## 2020-04-27 NOTE — Progress Notes (Signed)
Patient ID: Jordan [name], female   DOB: 25-Apr-1966, 54 y.o.   MRN: [id number]  SW spoke with [facility] [phone]) on bed offer. Pt likely to bed offer. SW waiting on updates on if there are any costs. SW waiting on follow-up.   *SW returned phone call to pt to provide updates on above. SW informed there will be updates provided once available.  Loralee Pacas, MSW, [place] Office: [phone] Cell: [phone] Fax: [phone]

## 2020-04-27 NOTE — Progress Notes (Signed)
Pt. spiritual care follow up.  The chaplain observed the Pt. less anxious and able to reflect on today's activities without emotion. The chaplain will explore with the Pt. what she finds the most helpful, outside of talking, when the Pt. finds her anxiety is growing. F/U spiritual care is available as needed.

## 2020-04-28 ENCOUNTER — Inpatient Hospital Stay (HOSPITAL_COMMUNITY): Payer: Medicaid Other

## 2020-04-28 ENCOUNTER — Ambulatory Visit (HOSPITAL_COMMUNITY): Payer: Medicaid Other | Admitting: *Deleted

## 2020-04-28 ENCOUNTER — Inpatient Hospital Stay (HOSPITAL_COMMUNITY): Payer: Medicaid Other | Admitting: Occupational Therapy

## 2020-04-28 ENCOUNTER — Inpatient Hospital Stay (HOSPITAL_COMMUNITY): Payer: Medicaid Other | Admitting: Physical Therapy

## 2020-04-28 DIAGNOSIS — N390 Urinary tract infection, site not specified: Secondary | ICD-10-CM

## 2020-04-28 DIAGNOSIS — A499 Bacterial infection, unspecified: Secondary | ICD-10-CM

## 2020-04-28 LAB — URINE CULTURE: Culture: >=100000 — AB

## 2020-04-28 MED ORDER — FOSFOMYCIN TROMETHAMINE 3 G PO PACK
3.0000 g | PACK | Freq: Once | ORAL | Status: AC
  Administered 2020-04-28: 3 g via ORAL
  Filled 2020-04-28: qty 3

## 2020-04-28 NOTE — Progress Notes (Signed)
Physical Therapy Session Note  Patient Details  Name: Jordan [name] MRN: [id number] Date of Birth: Apr 09, 1966  Today's Date: 04/28/2020 PT Individual Time: 1120-1214 and 1120-1214 PT Individual Time Calculation (min): 29 min and 54 min  Short Term Goals: Week 2:  PT Short Term Goal 1 (Week 2): Patient will perform basic transfer with supervision for safety and set-up. PT Short Term Goal 2 (Week 2): Patient will propell w/c 50 feet in simulated household space. PT Short Term Goal 3 (Week 2): Patient will perform bed mobility with supervision consistently with use of hospital bed functions.  Skilled Therapeutic Interventions/Progress Updates:  Treatment 1: Pt received in bed with cam boot doffed & agreeable to tx, reporting need to void but declining transferring to [facility]. Pt rolls L<>R with supervision and hospital bed features to allow PT to place bed pan total assist. Pt with continent void & PT assists with donning fresh brief, however no appropriate sized briefs available so donned smaller brief that was unable to snap on. Due to incorrect size brief pt declines attempting to stand or getting OOB to w/c. Pt requests to review HEP so pt performs the following: hip adduction squeezes, hip abduction, and straight leg raises, with questioning cues & min/mod cuing for technique overall. At end of session pt left in bed with alarm set & call bell, all needs in reach. Pain: 8/10 with exercises, rest breaks taken PRN  Treatment 2: Pt received in bed & agreeable to tx. Therapist wrapped RLE in ace wrap and donned/doffed cam boot total assist. Pt rolls and PT places bedpan as pt reports need to void but is unable once on bed pan. Pt transfers to sitting EOB & requires education for scooting hips backwards but instead pt leans back & pulls body across bed to w/c for A/P transfer. Pt is able to complete transfer with supervision +2 assist (2nd person stabilizing w/c per pt request). In hallway, pt propels w/c  5-7 ft max with BUE & supervision in multiple bouts with max encouragement. Back in room pt transfers w/c>bed with supervision +1 (PT stabilizing w/c per pt request). Pt educated on need to attempt challenging activities to progress with independence in functional mobility. Pt left in bed with alarm set, call bell & all needs in reach, 4 rails up per pt request, and set up with lunch tray.  Pain: 10/10 with activity - rest breaks provided & nurse administered pain meds during session, therapist educated on pursed lip breathing for distraction   Therapy Documentation Precautions:  Precautions Precautions: Fall Precaution Comments: has anxiety with mobility Required Braces or Orthoses: Other Brace Other Brace: CAM boot to R LE Restrictions Weight Bearing Restrictions: Yes RLE Weight Bearing: Non weight bearing    Therapy/Group: Individual Therapy  [name] 04/28/2020, 12:31 PM

## 2020-04-28 NOTE — Progress Notes (Signed)
Recreational Therapy Session Note  Patient Details  Name: Jordan [name] MRN: [id number] Date of Birth: June 09, 1966 Today's Date: 04/28/2020  Pain: no c/o Skilled Therapeutic Interventions/Progress Updates: Pt has been provided with activities for independent use in the room to assist with decreasing feelings of stress/anxiety.    [place] 04/28/2020, 12:28 PM

## 2020-04-28 NOTE — Progress Notes (Signed)
Occupational Therapy Session Note  Patient Details  Name: Jordan [name] MRN: [id number] Date of Birth: Oct 20, 1966  Today's Date: 04/28/2020 OT Individual Time: 7062-3762 OT Individual Time Calculation (min): 58 min    Short Term Goals: Week 2:  OT Short Term Goal 1 (Week 2): Pt will complete 2 BSC transfers this week with 1 person assist OT Short Term Goal 2 (Week 2): Pt will complete 1 step of toileting task OT Short Term Goal 3 (Week 2): Pt will assist with donning 25% of CAM boot  Skilled Therapeutic Interventions/Progress Updates:    Upon entering the room, pt sitting in circle position in bed. Pt requesting to wash self from bed level with set up A. Pt even washing R LE this session with encouragement although she was anxious about doing so. Pt rolling self in bed independently with use of bed rails for hygiene. Pt declines OOB activities secondary to fear of diarrhea this session. Pt donning dress with set up A. Pt performed grooming tasks of washing and combing hair with set up A for basin and needed items. Pt also brushing teeth with set up A. Call bell and all needed items within reach upon exiting the room.  Therapy Documentation Precautions:  Precautions Precautions: Fall Precaution Comments: has anxiety with mobility Required Braces or Orthoses: Other Brace Other Brace: CAM boot to R LE Restrictions Weight Bearing Restrictions: Yes RLE Weight Bearing: Non weight bearing General:   Vital Signs:   Pain: Pain Assessment Pain Scale: 0-10 Pain Score: 8  Pain Type: Surgical pain Pain Location: Leg Pain Orientation: Right Pain Descriptors / Indicators: Aching Pain Frequency: Constant Pain Onset: On-going Patients Stated Pain Goal: 4 Pain Intervention(s): Medication (See eMAR) (oxycodone) ADL: ADL Eating: Not assessed Grooming: Setup Where Assessed-Grooming: Edge of bed Upper Body Bathing: Setup Where Assessed-Upper Body Bathing: Edge of bed Lower Body Bathing:  Minimal assistance Where Assessed-Lower Body Bathing: Edge of bed, Bed level Upper Body Dressing: Minimal assistance Where Assessed-Upper Body Dressing: Edge of bed Lower Body Dressing: Maximal assistance Where Assessed-Lower Body Dressing: Bed level Toileting: Not assessed Toilet Transfer: Not assessed Tub/Shower Transfer: Not assessed Vision   Perception    Praxis   Exercises:   Other Treatments:     Therapy/Group: Individual Therapy  Gypsy Decant 04/28/2020, 10:26 AM

## 2020-04-28 NOTE — Progress Notes (Signed)
Pt. declined spiritual care visit today. Pt. is resting. Chaplain will F/U on Monday.

## 2020-04-28 NOTE — Progress Notes (Signed)
Physical Therapy Weekly Progress Note  Patient Details  Name: Jordan [name] MRN: [id number] Date of Birth: 1966/05/01  Beginning of progress report period: April 23, 2020 End of progress report period: April 28, 2020  Today's Date: 04/28/2020   Patient has met 2 of 3 short term goals.  Pt is making slow progress with functional mobility. Pt is able to propel w/c short distances with BUE & supervision, and today completed A/P transfer bed<>w/c with supervision and extra time. Pt is extremely anxious with all mobility tasks, requesting additional help than is needed sometimes (+2 for bed<>w/c transfers), and requests to return to bed during every session. Pt would benefit from continued skilled PT treatment to increase strength, endurance, and progress with independence with transfers. Pt will require extensive assist upon d/c 2/2 physical limitations.  Patient continues to demonstrate the following deficits muscle weakness, decreased cardiorespiratoy endurance, decreased awareness, decreased problem solving, decreased safety awareness and decreased memory, and decreased standing balance, decreased balance strategies and difficulty maintaining precautions and therefore will continue to benefit from skilled PT intervention to increase functional independence with mobility.  Patient not progressing toward long term goals.  See goal revision..  Plan of care revisions: standing goals & w/c mobility goals downgraded, bed<>w/c transfer upgraded 2/2 inconsistent, slow progress.  PT Short Term Goals Week 2:  PT Short Term Goal 1 (Week 2): Patient will perform basic transfer with supervision for safety and set-up. PT Short Term Goal 1 - Progress (Week 2): Met PT Short Term Goal 2 (Week 2): Patient will propell w/c 50 feet in simulated household space. PT Short Term Goal 2 - Progress (Week 2): Not met PT Short Term Goal 3 (Week 2): Patient will perform bed mobility with supervision consistently with use of  hospital bed functions. PT Short Term Goal 3 - Progress (Week 2): Met Week 3:  PT Short Term Goal 1 (Week 3): STG = LTG due to estimated d/c date.    Therapy Documentation Precautions:  Precautions Precautions: Fall Precaution Comments: has anxiety with mobility Required Braces or Orthoses: Other Brace Other Brace: CAM boot to R LE Restrictions Weight Bearing Restrictions: Yes RLE Weight Bearing: Non weight bearing   Therapy/Group: Individual Therapy  [name] 04/28/2020, 12:38 PM

## 2020-04-28 NOTE — Progress Notes (Signed)
Jordan [name] PHYSICAL MEDICINE & REHABILITATION PROGRESS NOTE   Subjective/Complaints: No new complaints. Remains somewhat fixated on swelling in RLE.   ROS: Patient denies fever, rash, sore throat, blurred vision, nausea, vomiting, diarrhea, cough, shortness of breath or chest pain, joint or back pain, headache, or mood change.    Objective:   VAS [name] LOWER EXTREMITY VENOUS (DVT)  Result Date: 04/26/2020  Lower Venous DVTStudy Indications: Fever, and Swelling.  Comparison Study: no prior Performing Technologist: Abram Sander RVS  Examination Guidelines: A complete evaluation includes B-mode imaging, spectral Doppler, color Doppler, and power Doppler as needed of all accessible portions of each vessel. Bilateral testing is considered an integral part of a complete examination. Limited examinations for reoccurring indications may be performed as noted. The reflux portion of the exam is performed with the patient in reverse Trendelenburg.  +---------+---------------+---------+-----------+----------+--------------+ RIGHT    CompressibilityPhasicitySpontaneityPropertiesThrombus Aging +---------+---------------+---------+-----------+----------+--------------+ CFV      Full           Yes      Yes                                 +---------+---------------+---------+-----------+----------+--------------+ SFJ      Full                                                        +---------+---------------+---------+-----------+----------+--------------+ FV Prox  Full                                                        +---------+---------------+---------+-----------+----------+--------------+ FV Mid   Full                                                        +---------+---------------+---------+-----------+----------+--------------+ FV DistalFull                                                        +---------+---------------+---------+-----------+----------+--------------+  PFV      Full                                                        +---------+---------------+---------+-----------+----------+--------------+ POP      Full           Yes      Yes                                 +---------+---------------+---------+-----------+----------+--------------+ PTV      Full                                                        +---------+---------------+---------+-----------+----------+--------------+  PERO                                                  Not visualized +---------+---------------+---------+-----------+----------+--------------+   +---------+---------------+---------+-----------+----------+--------------+ LEFT     CompressibilityPhasicitySpontaneityPropertiesThrombus Aging +---------+---------------+---------+-----------+----------+--------------+ CFV      Full           Yes      Yes                                 +---------+---------------+---------+-----------+----------+--------------+ SFJ      Full                                                        +---------+---------------+---------+-----------+----------+--------------+ FV Prox  Full                                                        +---------+---------------+---------+-----------+----------+--------------+ FV Mid   Full                                                        +---------+---------------+---------+-----------+----------+--------------+ FV Distal               Yes      Yes                                 +---------+---------------+---------+-----------+----------+--------------+ PFV      Full                                                        +---------+---------------+---------+-----------+----------+--------------+ POP      Full           Yes      Yes                                 +---------+---------------+---------+-----------+----------+--------------+ PTV      Full                                                         +---------+---------------+---------+-----------+----------+--------------+ PERO                                                  Not visualized +---------+---------------+---------+-----------+----------+--------------+  Summary: BILATERAL: - No evidence of deep vein thrombosis seen in the lower extremities, bilaterally. -No evidence of popliteal cyst, bilaterally.   *See table(s) above for measurements and observations. Electronically signed by Harold Barban MD on 04/26/2020 at 5:10:09 PM.    Final    Recent Labs    04/26/20 0530  WBC 3.3*  HGB 10.6*  HCT 33.2*  PLT 213   Recent Labs    04/26/20 0530  NA 138  K 4.4  CL 102  CO2 27  GLUCOSE 89  BUN 18  CREATININE 0.79  CALCIUM 8.9    Intake/Output Summary (Last 24 hours) at 04/28/2020 1102 Last data filed at 04/28/2020 0600 Gross per 24 hour  Intake --  Output 1200 ml  Net -1200 ml     Physical Exam: Vital Signs Blood pressure 113/61, pulse 92, temperature 99.8 F (37.7 C), temperature source Oral, resp. rate 17, height 5\' 8"  (1.727 m), weight (!) 147.6 kg, SpO2 100 %.   Constitutional: No distress . Vital signs reviewed. HEENT: EOMI, oral membranes moist Neck: supple Cardiovascular: RRR without murmur. No JVD    Respiratory/Chest: CTA Bilaterally without wheezes or rales. Normal effort    GI/Abdomen: BS +, non-tender, non-distended Ext: no clubbing, cyanosis, or 1+ RLE edema Psych: pleasant but anxious  Neuro:  Comments: UEs B/L 5/5 in deltoid, biceps, triceps, grip and finger abd B/L LLE- 5/5 in HF, KE, KF, DF and PF RLE-limited by pain. Is able to ADF/PF. Sensation seems normal Skin: right leg wounds all clean and dry.   Assessment/Plan: 1. Functional deficits secondary to RLE tib/fib fx s/p ex-fix and ORIF_ NWB on RLE which require 3+ hours per day of interdisciplinary therapy in a comprehensive inpatient rehab setting.  Physiatrist is providing close team supervision and 24  hour management of active medical problems listed below.  Physiatrist and rehab team continue to assess barriers to discharge/monitor patient progress toward functional and medical goals  Care Tool:  Bathing  Bathing activity did not occur:  (N/A) Body parts bathed by patient: Right arm, Left arm, Chest, Abdomen, Right upper leg, Left upper leg, Face, Front perineal area, Left lower leg, Right lower leg   Body parts bathed by helper: Buttocks Body parts n/a: Right lower leg   Bathing assist Assist Level: Minimal Assistance - Patient > 75%     Upper Body Dressing/Undressing Upper body dressing   What is the patient wearing?: Dress    Upper body assist Assist Level: Set up assist    Lower Body Dressing/Undressing Lower body dressing      What is the patient wearing?: Incontinence brief     Lower body assist Assist for lower body dressing: Moderate Assistance - Patient 50 - 74%     Toileting Toileting Toileting Activity did not occur Landscape architect and hygiene only): N/A (no void or bm)  Toileting assist Assist for toileting: Minimal Assistance - Patient > 75%     Transfers Chair/bed transfer  Transfers assist  Chair/bed transfer activity did not occur: Safety/medical concerns (unable to maintain R LE NWB)  Chair/bed transfer assist level: Set up assist Chair/bed transfer assistive device: Sliding board (not actually utilized for transfer, but placed under wc cushion and bed mattress (no gap/not visible)at pt insistence/pt feels safer w/this in place)   Locomotion Ambulation   Ambulation assist   Ambulation activity did not occur: Safety/medical concerns (unable to maintain R LE NWB in standing)          Walk 10  feet activity   Assist  Walk 10 feet activity did not occur: Safety/medical concerns        Walk 50 feet activity   Assist Walk 50 feet with 2 turns activity did not occur: Safety/medical concerns         Walk 150 feet  activity   Assist Walk 150 feet activity did not occur: Safety/medical concerns         Walk 10 feet on uneven surface  activity   Assist Walk 10 feet on uneven surfaces activity did not occur: Safety/medical concerns         Wheelchair     Assist Will patient use wheelchair at discharge?: Yes Type of Wheelchair: Manual    Wheelchair assist level: Minimal Assistance - Patient > 75% Max wheelchair distance: 60 (straight path)    Wheelchair 50 feet with 2 turns activity    Assist    Wheelchair 50 feet with 2 turns activity did not occur:  (decreased activity tolerance)   Assist Level: Maximal Assistance - Patient 25 - 49%   Wheelchair 150 feet activity     Assist  Wheelchair 150 feet activity did not occur:  (decreased activity tolerance)   Assist Level: Total Assistance - Patient < 25%   Blood pressure 113/61, pulse 92, temperature 99.8 F (37.7 C), temperature source Oral, resp. rate 17, height 5\' 8"  (1.727 m), weight (!) 147.6 kg, SpO2 100 %.  Medical Problem List and Plan: 1.  Impaired function secondary to RLE tib/fib comminuted fxs after fall-ORIF 6/21 by Dr. Doreatha Martin is NWB on RLE             -patient may  Shower if RLE is covered             -ELOS/Goals: 14-18 days- mod I to supervision  -continue CIR PT, OT.   -SNF pending 2.  Antithrombotics: -DVT/anticoagulation:  Pharmaceutical: Lovenox  -dopplers clear             -antiplatelet therapy: N/A 3. Pain Management: prn oxycodone   -gabapentin 100mg  tid  -ice/heat, edmea control, reassurance 4. Mood: LCSW to follow for evaluation and support.              -antipsychotic agents: On Latuda  -evaluation pending with Dr. Sima Matas re: anxiety,? PTSD in setting of her baseline bipolar disorder   -continue trileptal and latuda per baseline   7/9 anxiety/distraction remain barriers at times   -needs regular reinforcement and egosupport 5. Neuropsych: This patient is capable of making decisions  on her own behalf. 6. Skin/Wound Care: Routine pressure relief measures.    -all wounds CDI, ACE wrap to protect while in boot  -ACE wrap and elevation for edema control.  7. Fluids/Electrolytes/Nutrition: encourage po.     7/2 -vitamin D level ordered by ortho, low--resumed 50k vit D Q week 9. H/o Nocturia/frequent UTIs: On ditropan.   -oob to void  -urine cx + 100K E Coli (ESBL), klebsiella   -contact precautions   -sensitive to macrobid--->7 day rx, continent of urine   7/9 -low grade temp ongoing    -ucx 100k Klebsiella and now serratia    -continue macrobid and fosfomycin x 1 dose     -appreciate pharmacy assist with rx options 10. Constipation:    7/9 having formed to mushy stools  LOS: 13 days A FACE TO FACE EVALUATION WAS PERFORMED  Meredith Staggers 04/28/2020, 11:02 AM

## 2020-04-28 NOTE — Progress Notes (Signed)
Physical Therapy Session Note  Patient Details  Name: Jordan [name] MRN: [id number] Date of Birth: Sep 17, 1966  Today's Date: 04/28/2020 PT Individual Time: 1300-1345 PT Individual Time Calculation (min): 45 min   Short Term Goals: Week 1:  PT Short Term Goal 1 (Week 1): Patient will perform bed mobility with supervision without use of bed rails. PT Short Term Goal 1 - Progress (Week 1): Progressing toward goal PT Short Term Goal 2 (Week 1): Patient will perform basic transfers using LRAD with min A while maintaining R LE NWB precautions. PT Short Term Goal 2 - Progress (Week 1): Met PT Short Term Goal 3 (Week 1): Patient will propel the w/c >50 ft with supervision. PT Short Term Goal 3 - Progress (Week 1): Progressing toward goal Week 2:  PT Short Term Goal 1 (Week 2): Patient will perform basic transfer with supervision for safety and set-up. PT Short Term Goal 1 - Progress (Week 2): Met PT Short Term Goal 2 (Week 2): Patient will propell w/c 50 feet in simulated household space. PT Short Term Goal 2 - Progress (Week 2): Not met PT Short Term Goal 3 (Week 2): Patient will perform bed mobility with supervision consistently with use of hospital bed functions. PT Short Term Goal 3 - Progress (Week 2): Met Week 3:  PT Short Term Goal 1 (Week 3): STG = LTG due to estimated d/c date.  Skilled Therapeutic Interventions/Progress Updates:    PAIN Pt initially supine, refusing to get oob citing nausea.  Agreeable only to bed level therex despite encouragement. "If I get out of this bed I will throw up all over the place"  Therex as follows  to address core strength, R ankle rom (clear per ortho note 7/7) , general UE/LE strength: *pt requires max cues and supervision to complete full available AROM w/all therex    R ankle DFfPF x 20              Inv/eversion x 20 Ankle Alphabet for AROM  bilat LEs Hip abd/add x 20 Hip/knee flexion/extension 2 x 20  SLR x 15-20 2 sets Hip IR/ERx 20 LLE  clamshells 2x20 supine chest press w/4lb bar 2x20 Supine overhead raise w/4 lb bar 2x20 R ankle drawing 0-10 for multidirectional gentle ROM  Trunk rotation holding 4 lb bar x 20 Abdominal crunches w/hob elevated approx 25degrees 15 x 2sets  Therapist rewrapped RLE acewraps due to sagging.    At end of session. Pt left supine w/rails up x 4, alarm set, bed in lowest position, and needs in reach.  Pt continues to demonstrate self limiting behaviors   Therapy Documentation Precautions:  Precautions Precautions: Fall Precaution Comments: has anxiety with mobility Required Braces or Orthoses: Other Brace Other Brace: CAM boot to R LE Restrictions Weight Bearing Restrictions: Yes RLE Weight Bearing: Non weight bearing    Therapy/Group: Individual Therapy  Callie Fielding, [place] 04/28/2020, 4:21 PM

## 2020-04-28 NOTE — Plan of Care (Signed)
Goals modified 2/2 progress, see below.  Problem: RH Balance Goal: LTG Patient will maintain dynamic standing balance (PT) Description: LTG:  Patient will maintain dynamic standing balance with assistance during mobility activities (PT) Flowsheets (Taken 04/28/2020 1231) LTG: Pt will maintain dynamic standing balance during mobility activities with:: (downgrade 2/2 slow progress) Maximal Assistance - Patient 25 - 49% Note: downgrade 2/2 slow progress   Problem: Sit to Stand Goal: LTG:  Patient will perform sit to stand with assistance level (PT) Description: LTG:  Patient will perform sit to stand with assistance level (PT) Flowsheets (Taken 04/28/2020 1231) LTG: PT will perform sit to stand in preparation for functional mobility with assistance level: (downgrade 2/2 slow progress) Maximal Assistance - Patient 25 - 49% Note: downgrade 2/2 slow progress   Problem: RH Bed to Chair Transfers Goal: LTG Patient will perform bed/chair transfers w/assist (PT) Description: LTG: Patient will perform bed to chair transfers with assistance (PT). Flowsheets (Taken 04/28/2020 1231) LTG: Pt will perform Bed to Chair Transfers with assistance level: (upgrade 2/2 progress) Set up assist  Note: upgrade 2/2 progress   Problem: RH Wheelchair Mobility Goal: LTG Patient will propel w/c in controlled environment (PT) Description: LTG: Patient will propel wheelchair in controlled environment, # of feet with assist (PT) Flowsheets (Taken 04/28/2020 1231) LTG: Pt will propel w/c in controlled environ  assist needed:: Supervision/Verbal cueing LTG: Propel w/c distance in controlled environment: (downgrade 2/2 slow progress) 25 ft Goal: LTG Patient will propel w/c in home environment (PT) Description: LTG: Patient will propel wheelchair in home environment, # of feet with assistance (PT). Flowsheets (Taken 04/28/2020 1231) LTG: Pt will propel w/c in home environ  assist needed:: (downgrade 2/2 slow progress)  Supervision/Verbal cueing LTG: Propel w/c distance in home environment: 10 ft Note: downgrade 2/2 slow progress

## 2020-04-29 DIAGNOSIS — G8918 Other acute postprocedural pain: Secondary | ICD-10-CM

## 2020-04-29 DIAGNOSIS — D72819 Decreased white blood cell count, unspecified: Secondary | ICD-10-CM

## 2020-04-29 DIAGNOSIS — N390 Urinary tract infection, site not specified: Secondary | ICD-10-CM

## 2020-04-29 DIAGNOSIS — E559 Vitamin D deficiency, unspecified: Secondary | ICD-10-CM

## 2020-04-29 DIAGNOSIS — D62 Acute posthemorrhagic anemia: Secondary | ICD-10-CM

## 2020-04-29 NOTE — Progress Notes (Signed)
[place] PHYSICAL MEDICINE & REHABILITATION PROGRESS NOTE   Subjective/Complaints: Patient seen sitting up in bed this morning.  She states she slept well overnight.  She has questions regarding the type of physician I am as well as the type of physician Dr. Eda Keys is.  ROS: Denies CP, SOB, N/V/D  Objective:   No results found. No results for input(s): WBC, HGB, HCT, PLT in the last 72 hours. No results for input(s): NA, K, CL, CO2, GLUCOSE, BUN, CREATININE, CALCIUM in the last 72 hours.  Intake/Output Summary (Last 24 hours) at 04/29/2020 1410 Last data filed at 04/29/2020 0735 Gross per 24 hour  Intake 220 ml  Output 1075 ml  Net -855 ml     Physical Exam: Vital Signs Blood pressure 107/63, pulse 78, temperature (!) 97.5 F (36.4 C), temperature source Oral, resp. rate 18, height 5\' 8"  (1.727 m), weight (!) 147.6 kg, SpO2 99 %. Constitutional: No distress . Vital signs reviewed. HENT: Normocephalic.  Atraumatic. Eyes: EOMI. No discharge. Cardiovascular: No JVD.  RRR. Respiratory: Normal effort.  No stridor.  Bilateral clear to auscultation. GI: Non-distended.  BS +. Skin: Right foot with dressing C/C/I Psych: Flat. Musc: Right lower extremity edema. Neuro: Alert Motor: Bilateral upper extremities: 5/5 proximal distal Left lower extremity: 5/5 proximal distal Right lower extremity: Limited by pain proximally, ankle dorsiflexion 4/5  Assessment/Plan: 1. Functional deficits secondary to RLE tib/fib fx s/p ex-fix and ORIF_ NWB on RLE which require 3+ hours per day of interdisciplinary therapy in a comprehensive inpatient rehab setting.  Physiatrist is providing close team supervision and 24 hour management of active medical problems listed below.  Physiatrist and rehab team continue to assess barriers to discharge/monitor patient progress toward functional and medical goals  Care Tool:  Bathing  Bathing activity did not occur:  (N/A) Body parts bathed by patient:  Right arm, Left arm, Chest, Abdomen, Right upper leg, Left upper leg, Face, Front perineal area, Left lower leg, Right lower leg   Body parts bathed by helper: Buttocks Body parts n/a: Right lower leg   Bathing assist Assist Level: Minimal Assistance - Patient > 75%     Upper Body Dressing/Undressing Upper body dressing   What is the patient wearing?: Dress    Upper body assist Assist Level: Set up assist    Lower Body Dressing/Undressing Lower body dressing      What is the patient wearing?: Incontinence brief     Lower body assist Assist for lower body dressing: Moderate Assistance - Patient 50 - 74%     Toileting Toileting Toileting Activity did not occur Landscape architect and hygiene only): N/A (no void or bm)  Toileting assist Assist for toileting: Minimal Assistance - Patient > 75%     Transfers Chair/bed transfer  Transfers assist  Chair/bed transfer activity did not occur: Safety/medical concerns (unable to maintain R LE NWB)  Chair/bed transfer assist level: Supervision/Verbal cueing Chair/bed transfer assistive device: Armrests (A/P transfer)   Locomotion Ambulation   Ambulation assist   Ambulation activity did not occur: Safety/medical concerns (unable to maintain R LE NWB in standing)          Walk 10 feet activity   Assist  Walk 10 feet activity did not occur: Safety/medical concerns        Walk 50 feet activity   Assist Walk 50 feet with 2 turns activity did not occur: Safety/medical concerns         Walk 150 feet activity   Assist Walk  150 feet activity did not occur: Safety/medical concerns         Walk 10 feet on uneven surface  activity   Assist Walk 10 feet on uneven surfaces activity did not occur: Safety/medical concerns         Wheelchair     Assist Will patient use wheelchair at discharge?: Yes Type of Wheelchair: Manual    Wheelchair assist level: Minimal Assistance - Patient > 75% Max  wheelchair distance: 60 (straight path)    Wheelchair 50 feet with 2 turns activity    Assist    Wheelchair 50 feet with 2 turns activity did not occur:  (decreased activity tolerance)   Assist Level: Maximal Assistance - Patient 25 - 49%   Wheelchair 150 feet activity     Assist  Wheelchair 150 feet activity did not occur:  (decreased activity tolerance)   Assist Level: Total Assistance - Patient < 25%   Blood pressure 107/63, pulse 78, temperature (!) 97.5 F (36.4 C), temperature source Oral, resp. rate 18, height 5\' 8"  (1.727 m), weight (!) 147.6 kg, SpO2 99 %.  Medical Problem List and Plan: 1.  Impaired function secondary to RLE tib/fib comminuted fxs after fall-ORIF 6/21 by Dr. Doreatha Martin is NWB on RLE  Continue CIR, pending SNF  2.  Antithrombotics: -DVT/anticoagulation:  Pharmaceutical: Lovenox  -dopplers clear             -antiplatelet therapy: N/A 3. Pain Management: prn oxycodone   -gabapentin 100mg  tid  -ice/heat, edmea control, reassurance  Controlled with meds on 7/10 4. Mood: LCSW to follow for evaluation and support.              -antipsychotic agents: On Latuda  -evaluation pending with Dr. Sima Matas re: anxiety,? PTSD in setting of her baseline bipolar disorder   -continue trileptal and latuda per baseline   -needs regular reinforcement and egosupport 5. Neuropsych: This patient is capable of making decisions on her own behalf. 6. Skin/Wound Care: Routine pressure relief measures.    -ACE wrap and elevation for edema control.  7. Fluids/Electrolytes/Nutrition: encourage po.   9. H/o Nocturia/frequent UTIs: On ditropan.   -oob to void  -urine cx + 100K E Coli (ESBL), klebsiella   -contact precautions   -sensitive to Macrobid --->7 day rx, continent of urine   7/9 -low grade temp ongoing    -ucx 100k Klebsiella and now serratia    -continue macrobid and fosfomycin x 1 dose 10. Constipation:    Improving 11.  Vitamin D  deficiency  Supplement 12.  Leukopenia  WBC 3.3 on 7/7, labs ordered for Monday 13.  Acute blood loss anemia  Hemoglobin 10.6 on 7/7, labs ordered for Monday  LOS: 14 days A FACE TO FACE EVALUATION WAS PERFORMED  Ashleigh Arya Lorie Phenix 04/29/2020, 2:10 PM

## 2020-04-30 ENCOUNTER — Inpatient Hospital Stay (HOSPITAL_COMMUNITY): Payer: Medicaid Other | Admitting: Occupational Therapy

## 2020-04-30 ENCOUNTER — Inpatient Hospital Stay (HOSPITAL_COMMUNITY): Payer: Medicaid Other

## 2020-04-30 NOTE — Progress Notes (Signed)
[facility] PHYSICAL MEDICINE & REHABILITATION PROGRESS NOTE   Subjective/Complaints: Patient seen sitting up in bed this morning.  She states she slept well overnight.  She denies complaints.  ROS: Denies CP, SOB, N/V/D  Objective:   No results found. No results for input(s): WBC, HGB, HCT, PLT in the last 72 hours. No results for input(s): NA, K, CL, CO2, GLUCOSE, BUN, CREATININE, CALCIUM in the last 72 hours.  Intake/Output Summary (Last 24 hours) at 04/30/2020 1648 Last data filed at 04/30/2020 1625 Gross per 24 hour  Intake 600 ml  Output 925 ml  Net -325 ml     Physical Exam: Vital Signs Blood pressure 108/63, pulse 92, temperature 98.9 F (37.2 C), temperature source Oral, resp. rate 20, height 5\' 8"  (1.727 m), weight (!) 147.6 kg, SpO2 95 %. Constitutional: No distress . Vital signs reviewed. HENT: Normocephalic.  Atraumatic. Eyes: EOMI. No discharge. Cardiovascular: No JVD. Respiratory: Normal effort.  No stridor. GI: Non-distended. Skin: Right foot healing Psych: Flat Musc: Right distal lower extremity edema with tenderness Neuro:  Alert Motor: Bilateral upper extremities: 5/5 proximal distal Left lower extremity: 5/5 proximal distal Right lower extremity: Limited by pain proximally, ankle dorsiflexion 4/5, unchanged  Assessment/Plan: 1. Functional deficits secondary to RLE tib/fib fx s/p ex-fix and ORIF_ NWB on RLE which require 3+ hours per day of interdisciplinary therapy in a comprehensive inpatient rehab setting.  Physiatrist is providing close team supervision and 24 hour management of active medical problems listed below.  Physiatrist and rehab team continue to assess barriers to discharge/monitor patient progress toward functional and medical goals  Care Tool:  Bathing  Bathing activity did not occur:  (N/A) Body parts bathed by patient: Right arm, Left arm, Chest, Abdomen, Right upper leg, Left upper leg, Face, Left lower leg, Right lower leg    Body parts bathed by helper: Buttocks Body parts n/a: Front perineal area, Buttocks   Bathing assist Assist Level: Set up assist     Upper Body Dressing/Undressing Upper body dressing   What is the patient wearing?: Hospital gown only    Upper body assist Assist Level: Set up assist    Lower Body Dressing/Undressing Lower body dressing      What is the patient wearing?: Incontinence brief     Lower body assist Assist for lower body dressing: Moderate Assistance - Patient 50 - 74%     Toileting Toileting Toileting Activity did not occur Landscape architect and hygiene only): N/A (no void or bm)  Toileting assist Assist for toileting: Minimal Assistance - Patient > 75%     Transfers Chair/bed transfer  Transfers assist  Chair/bed transfer activity did not occur: Safety/medical concerns (unable to maintain R LE NWB)  Chair/bed transfer assist level: Supervision/Verbal cueing Chair/bed transfer assistive device: Armrests (A/P transfer)   Locomotion Ambulation   Ambulation assist   Ambulation activity did not occur: Safety/medical concerns (unable to maintain R LE NWB in standing)          Walk 10 feet activity   Assist  Walk 10 feet activity did not occur: Safety/medical concerns        Walk 50 feet activity   Assist Walk 50 feet with 2 turns activity did not occur: Safety/medical concerns         Walk 150 feet activity   Assist Walk 150 feet activity did not occur: Safety/medical concerns         Walk 10 feet on uneven surface  activity   Assist Walk  10 feet on uneven surfaces activity did not occur: Safety/medical concerns         Wheelchair     Assist Will patient use wheelchair at discharge?: Yes Type of Wheelchair: Manual    Wheelchair assist level: Minimal Assistance - Patient > 75% Max wheelchair distance: 60 (straight path)    Wheelchair 50 feet with 2 turns activity    Assist    Wheelchair 50 feet with 2  turns activity did not occur:  (decreased activity tolerance)   Assist Level: Maximal Assistance - Patient 25 - 49%   Wheelchair 150 feet activity     Assist  Wheelchair 150 feet activity did not occur:  (decreased activity tolerance)   Assist Level: Total Assistance - Patient < 25%   Blood pressure 108/63, pulse 92, temperature 98.9 F (37.2 C), temperature source Oral, resp. rate 20, height 5\' 8"  (1.727 m), weight (!) 147.6 kg, SpO2 95 %.  Medical Problem List and Plan: 1.  Impaired function secondary to RLE tib/fib comminuted fxs after fall-ORIF 6/21 by Dr. Doreatha Martin is NWB on RLE  Continue CIR, pending SNF  2.  Antithrombotics: -DVT/anticoagulation:  Pharmaceutical: Lovenox  -dopplers clear             -antiplatelet therapy: N/A 3. Pain Management: prn oxycodone   -gabapentin 100mg  tid  -ice/heat, edmea control, reassurance  Controlled with meds on 7/11 4. Mood: LCSW to follow for evaluation and support.              -antipsychotic agents: On Latuda  -evaluation pending with Dr. Sima Matas re: anxiety,? PTSD in setting of her baseline bipolar disorder   -continue trileptal and latuda per baseline   -needs regular reinforcement and egosupport 5. Neuropsych: This patient is capable of making decisions on her own behalf. 6. Skin/Wound Care: Routine pressure relief measures.    -ACE wrap and elevation for edema control.  7. Fluids/Electrolytes/Nutrition: encourage po.   9. H/o Nocturia/frequent UTIs: On ditropan.   -oob to void  -urine cx + 100K E Coli (ESBL), klebsiella   -contact precautions   -sensitive to Macrobid --->7 day rx, continent of urine       -ucx 100k Klebsiella and now serratia    -continue Macrobid and fosfomycin x 1 dose 10. Constipation:    Improving 11.  Vitamin D deficiency  Supplement 12.  Leukopenia  WBC 3.3 on 7/7, labs ordered for tomorrow 13.  Acute blood loss anemia  Hemoglobin 10.6 on 7/7, labs ordered for tomorrow  LOS: 15 days A FACE  TO FACE EVALUATION WAS PERFORMED  Zamora Colton Lorie Phenix 04/30/2020, 4:48 PM

## 2020-04-30 NOTE — Plan of Care (Signed)
  Problem: RH BLADDER ELIMINATION Goal: RH STG MANAGE BLADDER WITH ASSISTANCE Description: STG Manage Bladder With mod I Assistance Outcome: Not Progressing; incontinence at times

## 2020-04-30 NOTE — Progress Notes (Signed)
Physical Therapy Session Note  Patient Details  Name: Jordan [name] MRN: [id number] Date of Birth: 1966-08-14  Today's Date: 04/30/2020 PT Individual Time: 1025-1100 PT Individual Time Calculation (min): 35 min   Short Term Goals: Week 3:  PT Short Term Goal 1 (Week 3): STG = LTG due to estimated d/c date.  Skilled Therapeutic Interventions/Progress Updates:     Patient in bed on the phone with her pharmacy upon PT arrival. Patient required 10 min to complete her phone call. Patient reported 7-8/10 R LE pain during session, RN made aware. PT provided repositioning, rest breaks, and distraction as pain interventions throughout session. Patient with several concerns about follow-up with her pain management specialist next month due to being told if she missed this appointment she would not be able to get her pain medicine. Provided education on communication with providers and will pass along to rehab team to assist patient with management of this. Patient also with questions about her dressing on her R foot, patient with large foam dressing of incisions on R foot, appears to be compliant with MD orders in chart, differed further questions to RN at end of session.   Patient also expressed concerns about therapy. Stating that she feels she is making good progress, however does not feel as though she if getting this feedback from therapy. PT reviewed her progress at admission and current progress. Discussed barriers to progress including weight bearing restrictions, pain, body habitus, anxiety, poor frustration tolerance. Discussed goals for mobility this week and strategies to achieve them including calling for pain medicine at least 30-40 min prior to therapies for improved pain control, having a quiet room for 30 min prior to therapies when possible (no phone calls, TV, etc) and to perform relaxation techniques discussed during prior therapy sessions. Set goals for getting into the shower with OT using  bariatric transport shower chair and standing in // bars with PT. Patient has concerns about weight bearing through her R LE in standing due to decreased balance and increased body mass. PT demonstrated weight shift to L LE and forward weight shift to lift hips with stand for improved success without weight bearing on R LE. Patient requested not to get OOB during this session due to recent bowl incontinence. Reviewed bed level exercises, patient unable to recall any exercises performed prior. Patient performed L ankle pumps and gentle R AAROM DF/PF, B heel slides, B SLR, and B hip abduction 2x10 in supine. Patient able to teach back exercises to therapist after. Educated on performing exercises again today due to limited mobility this session. Patient in agreement. Patient in bed with bed alarm set and all need in reach at end of sess.    Therapy Documentation Precautions:  Precautions Precautions: Fall Precaution Comments: has anxiety with mobility Required Braces or Orthoses: Other Brace Other Brace: CAM boot to R LE Restrictions Weight Bearing Restrictions: Yes RLE Weight Bearing: Non weight bearing General: PT Amount of Missed Time (min): 10 Minutes PT Missed Treatment Reason: Other (Comment) (patient on phone)    Therapy/Group: Individual Therapy  Talea Manges L Greidy Sherard PT, DPT  04/30/2020, 12:39 PM

## 2020-04-30 NOTE — Progress Notes (Signed)
Occupational Therapy Session Note  Patient Details  Name: Jordan [name] MRN: [id number] Date of Birth: Sep 17, 1966  Today's Date: 04/30/2020 OT Individual Time: 6578-4696 OT Individual Time Calculation (min): 73 min   Short Term Goals: Week 2:  OT Short Term Goal 1 (Week 2): Pt will complete 2 BSC transfers this week with 1 person assist OT Short Term Goal 2 (Week 2): Pt will complete 1 step of toileting task OT Short Term Goal 3 (Week 2): Pt will assist with donning 25% of CAM boot  Skilled Therapeutic Interventions/Progress Updates:    Pt greeted in bed with NT present, just finished with perihygiene due to incontinence in brief. Asked pt if she wanted to shower today and she refused, stating: "I don't like that chair. It doesn't give me enough support for my back." Educated pt that the bariatric roll in shower chair was the safest and only feasible DME for shower transfers at this time. Pt opting to engage in bathing/dressing tasks from bedlevel, refusing OOB tx today. Set pt up for stated tasks while she sat in unsupported long sitting to work on trunk control. Pt issued a LH sponge because she c/o increased pain when bending forward to wash the Rt foot. Min A for safely shaving Rt LE around incision sites. Setup for oral care and grooming tasks. Throughout session, educated pt on holistic coping mgt strategies for anxiety, pain, and stress. Pt politely dismissing all education, stating she has her "own way" to relax. At end of session pt remained in bed with all needs within reach and bed alarm set.   Therapy Documentation Precautions:  Precautions Precautions: Fall Precaution Comments: has anxiety with mobility Required Braces or Orthoses: Other Brace Other Brace: CAM boot to R LE Restrictions Weight Bearing Restrictions: Yes RLE Weight Bearing: Non weight bearing Vital Signs: Therapy Vitals Temp: 98.9 F (37.2 C) Temp Source: Oral Pulse Rate: 92 Resp: 20 BP: 108/63 Patient  Position (if appropriate): Sitting Oxygen Therapy SpO2: 95 % O2 Device: Room Air Pain: Pt reported being premedicated for pain Pain Assessment Pain Scale: 0-10 Pain Score: 2  Pain Type: Surgical pain Pain Location: Leg Pain Orientation: Right Pain Descriptors / Indicators: Aching Pain Frequency: Constant Pain Onset: On-going Pain Intervention(s): Medication (See eMAR) ADL: ADL Eating: Not assessed Grooming: Setup Where Assessed-Grooming: Edge of bed Upper Body Bathing: Setup Where Assessed-Upper Body Bathing: Edge of bed Lower Body Bathing: Minimal assistance Where Assessed-Lower Body Bathing: Edge of bed, Bed level Upper Body Dressing: Minimal assistance Where Assessed-Upper Body Dressing: Edge of bed Lower Body Dressing: Maximal assistance Where Assessed-Lower Body Dressing: Bed level Toileting: Not assessed Toilet Transfer: Not assessed Tub/Shower Transfer: Not assessed :     Therapy/Group: Individual Therapy  Michaela A Hoffman 04/30/2020, 3:41 PM

## 2020-05-01 ENCOUNTER — Inpatient Hospital Stay (HOSPITAL_COMMUNITY): Payer: Medicaid Other | Admitting: Physical Therapy

## 2020-05-01 ENCOUNTER — Ambulatory Visit: Payer: Medicaid Other | Admitting: Radiation Oncology

## 2020-05-01 ENCOUNTER — Inpatient Hospital Stay (HOSPITAL_COMMUNITY): Payer: Medicaid Other | Admitting: Occupational Therapy

## 2020-05-01 NOTE — Plan of Care (Signed)
  Problem: RH Tub/Shower Transfers Goal: LTG Patient will perform tub/shower transfers w/assist (OT) Description: LTG: Patient will perform tub/shower transfers with assist, with/without cues using equipment (OT) Outcome: Not Applicable Flowsheets (Taken 05/01/2020 0825) LTG: Pt will perform tub/shower stall transfers with assistance level of: (dc goal 7/12) -- Note: Dc goal 7/12 due to lack of progress   Problem: RH Grooming Goal: LTG Patient will perform grooming w/assist,cues/equip (OT) Description: LTG: Patient will perform grooming with assist, with/without cues using equipment (OT) Flowsheets (Taken 05/01/2020 0825) LTG: Pt will perform grooming with assistance level of: Set up assist  Note: Goal downgraded 7/12 due to lack of progress-ESD   Problem: RH Bathing Goal: LTG Patient will bathe all body parts with assist levels (OT) Description: LTG: Patient will bathe all body parts with assist levels (OT) Flowsheets (Taken 05/01/2020 0825) LTG: Pt will perform bathing with assistance level/cueing: Minimal Assistance - Patient > 75% Note: Goal downgraded 7/12 due to lack of progress-ESD   Problem: RH Dressing Goal: LTG Patient will perform upper body dressing (OT) Description: LTG Patient will perform upper body dressing with assist, with/without cues (OT). Flowsheets (Taken 05/01/2020 0825) LTG: Pt will perform upper body dressing with assistance level of: Set up assist Note: Goal downgraded 7/12 due to lack of progress-ESD Goal: LTG Patient will perform lower body dressing w/assist (OT) Description: LTG: Patient will perform lower body dressing with assist, with/without cues in positioning using equipment (OT) Flowsheets (Taken 05/01/2020 0825) LTG: Pt will perform lower body dressing with assistance level of: Minimal Assistance - Patient > 75% Note: Goal downgraded 7/12 due to lack of progress-ESD   Problem: RH Toileting Goal: LTG Patient will perform toileting task (3/3 steps)  with assistance level (OT) Description: LTG: Patient will perform toileting task (3/3 steps) with assistance level (OT)  Flowsheets (Taken 05/01/2020 0825) LTG: Pt will perform toileting task (3/3 steps) with assistance level: Minimal Assistance - Patient > 75% Note: Goal downgraded 7/12 due to lack of progress-ESD   Problem: RH Toilet Transfers Goal: LTG Patient will perform toilet transfers w/assist (OT) Description: LTG: Patient will perform toilet transfers with assist, with/without cues using equipment (OT) Flowsheets (Taken 05/01/2020 0825) LTG: Pt will perform toilet transfers with assistance level of: Minimal Assistance - Patient > 75% Note: Goal downgraded 7/12 due to lack of progress-ESD

## 2020-05-01 NOTE — Progress Notes (Signed)
[name] PHYSICAL MEDICINE & REHABILITATION PROGRESS NOTE   Subjective/Complaints: Up in bed. Perseverates over wounds and sleeping medicine that she said "I dont want"  ROS: Patient denies fever, rash, sore throat, blurred vision, nausea, vomiting, diarrhea, cough, shortness of breath or chest pain,   headache, or mood change.    Objective:   No results found. No results for input(s): WBC, HGB, HCT, PLT in the last 72 hours. No results for input(s): NA, K, CL, CO2, GLUCOSE, BUN, CREATININE, CALCIUM in the last 72 hours.  Intake/Output Summary (Last 24 hours) at 05/01/2020 1128 Last data filed at 05/01/2020 0820 Gross per 24 hour  Intake 360 ml  Output 900 ml  Net -540 ml     Physical Exam: Vital Signs Blood pressure (!) 112/54, pulse 76, temperature 97.8 F (36.6 C), temperature source Oral, resp. rate 18, height 5\' 8"  (1.727 m), weight (!) 147.6 kg, SpO2 100 %. Constitutional: No distress . Vital signs reviewed. HEENT: EOMI, oral membranes moist Neck: supple Cardiovascular: RRR without murmur. No JVD    Respiratory/Chest: CTA Bilaterally without wheezes or rales. Normal effort    GI/Abdomen: BS +, non-tender, non-distended Ext: no clubbing, cyanosis, or edema Psych: pleasant and cooperative Musc: Right distal lower extremity edema much improved Neuro:  Alert Motor: Bilateral upper extremities: 5/5 proximal distal Left lower extremity: 5/5 proximal distal Right lower extremity: Limited by pain proximally, ankle dorsiflexion 4/5, unchanged Skin: all wounds RLE clean and dry  Assessment/Plan: 1. Functional deficits secondary to RLE tib/fib fx s/p ex-fix and ORIF_ NWB on RLE which require 3+ hours per day of interdisciplinary therapy in a comprehensive inpatient rehab setting.  Physiatrist is providing close team supervision and 24 hour management of active medical problems listed below.  Physiatrist and rehab team continue to assess barriers to discharge/monitor patient  progress toward functional and medical goals  Care Tool:  Bathing  Bathing activity did not occur:  (N/A) Body parts bathed by patient: Right arm, Left arm, Chest, Abdomen, Right upper leg, Left upper leg, Face, Left lower leg, Right lower leg   Body parts bathed by helper: Buttocks Body parts n/a: Front perineal area, Buttocks   Bathing assist Assist Level: Set up assist     Upper Body Dressing/Undressing Upper body dressing   What is the patient wearing?: Hospital gown only    Upper body assist Assist Level: Set up assist    Lower Body Dressing/Undressing Lower body dressing      What is the patient wearing?: Incontinence brief     Lower body assist Assist for lower body dressing: Moderate Assistance - Patient 50 - 74%     Toileting Toileting Toileting Activity did not occur Landscape architect and hygiene only): N/A (no void or bm)  Toileting assist Assist for toileting: Minimal Assistance - Patient > 75%     Transfers Chair/bed transfer  Transfers assist  Chair/bed transfer activity did not occur: Safety/medical concerns (unable to maintain R LE NWB)  Chair/bed transfer assist level: Supervision/Verbal cueing Chair/bed transfer assistive device: Armrests (A/P transfer)   Locomotion Ambulation   Ambulation assist   Ambulation activity did not occur: Safety/medical concerns (unable to maintain R LE NWB in standing)          Walk 10 feet activity   Assist  Walk 10 feet activity did not occur: Safety/medical concerns        Walk 50 feet activity   Assist Walk 50 feet with 2 turns activity did not occur: Safety/medical concerns  Walk 150 feet activity   Assist Walk 150 feet activity did not occur: Safety/medical concerns         Walk 10 feet on uneven surface  activity   Assist Walk 10 feet on uneven surfaces activity did not occur: Safety/medical concerns         Wheelchair     Assist Will patient use  wheelchair at discharge?: Yes Type of Wheelchair: Manual    Wheelchair assist level: Minimal Assistance - Patient > 75% Max wheelchair distance: 60 (straight path)    Wheelchair 50 feet with 2 turns activity    Assist    Wheelchair 50 feet with 2 turns activity did not occur:  (decreased activity tolerance)   Assist Level: Maximal Assistance - Patient 25 - 49%   Wheelchair 150 feet activity     Assist  Wheelchair 150 feet activity did not occur:  (decreased activity tolerance)   Assist Level: Total Assistance - Patient < 25%   Blood pressure (!) 112/54, pulse 76, temperature 97.8 F (36.6 C), temperature source Oral, resp. rate 18, height 5\' 8"  (1.727 m), weight (!) 147.6 kg, SpO2 100 %.  Medical Problem List and Plan: 1.  Impaired function secondary to RLE tib/fib comminuted fxs after fall-ORIF 6/21 by Dr. Doreatha Martin is NWB on RLE  Continue CIR, pending SNF  2.  Antithrombotics: -DVT/anticoagulation:  Pharmaceutical: Lovenox  -dopplers clear             -antiplatelet therapy: N/A 3. Pain Management: prn oxycodone   -gabapentin 100mg  tid  -ice/heat, edmea control, reassurance  Controlled with meds on 7/12 4. Mood: LCSW to follow for evaluation and support.              -antipsychotic agents: On Latuda  -evaluation pending with Dr. Sima Matas re: anxiety,? PTSD in setting of her baseline bipolar disorder   -continue trileptal and latuda per baseline   -needs regular reinforcement and egosupport 5. Neuropsych: This patient is capable of making decisions on her own behalf. 6. Skin/Wound Care: Routine pressure relief measures.    -ACE wrap and elevation for edema control.  7. Fluids/Electrolytes/Nutrition: encourage po.   9. H/o Nocturia/frequent UTIs: On ditropan.   -oob to void  -urine cx + 100K E Coli (ESBL), klebsiella   -contact precautions   -sensitive to Macrobid --->7 day rx, continent of urine       -ucx 100k Klebsiella and now serratia    -continue  Macrobid     -received fosfomycin x 1 dose 10. Constipation:    Improved 11.  Vitamin D deficiency  Supplement 12.  Leukopenia  WBC 3.3 on 7/7, labs pending for wednesday 13.  Acute blood loss anemia  Hemoglobin 10.6 on 7/7, labs pending for wednesday  LOS: 16 days A FACE TO [place] 05/01/2020, 11:28 AM

## 2020-05-01 NOTE — Progress Notes (Signed)
Patient ID: Jordan [name], female   DOB: [birth year], 54 y.o.   MRN: [id number]   SW spoke with Logan Bores to get updates on SNF details. Waiting on updates from Billing dept to determine if pt will have any costs. Will follow-up once there is more information.  Loralee Pacas, MSW, [place] Office: [phone] Cell: [phone] Fax: [phone]

## 2020-05-01 NOTE — Progress Notes (Signed)
Occupational Therapy Session Note  Patient Details  Name: Jennae Hakeem MRN: [id number] Date of Birth: 01/21/1966  Today's Date: 05/01/2020 OT Individual Time: 1300-1345 OT Individual Time Calculation (min): 45 min    Short Term Goals: Week 3:  OT Short Term Goal 1 (Week 3): Pt will complete 2 BSC transfers this week with 1 person assist OT Short Term Goal 2 (Week 3): Pt will complete 1 step of toileting task OT Short Term Goal 3 (Week 3): Pt will assist with donning 25% of CAM boot  Skilled Therapeutic Interventions/Progress Updates:    Pt greeted semi-reclined in bed and agreeable to OT treatment session. Pt declined to get OOB again this afternoon but agreeable to UB there-ex. Pt came to sitting EOB with supervision. OT wrapped R LE up to knee for better edema control. Pt completed UB there-ex seated EOB using level 3 green theraband. Pt completed 3 sets of 10 bicep curls, triceps press, seated row, and horizontal arm raises. Pt needed demonstration and guided A at times to maintain proper body mechanics within exercises. Pt returned to bed at end of session with supervision. Pt left semi-reclined in bed with bed alarm on, call bell in reach, and needs met.   Therapy Documentation Precautions:  Precautions Precautions: Fall Precaution Comments: has anxiety with mobility Required Braces or Orthoses: Other Brace Other Brace: CAM boot to R LE Restrictions Weight Bearing Restrictions: Yes RLE Weight Bearing: Non weight bearing Pain:  Pt reports pain in R ankle and back. OT repositioned for comfort, but pt still requesting pain meds and called to nurses station to request them.   Therapy/Group: Individual Therapy  Valma Cava 05/01/2020, 1:18 PM

## 2020-05-01 NOTE — Progress Notes (Addendum)
Physical Therapy Session Note  Patient Details  Name: Jordan [name] MRN: [id number] Date of Birth: 1966-10-03  Today's Date: 05/01/2020 PT Individual Time: 1019-1130 PT Individual Time Calculation (min): 71 min   Short Term Goals: Week 3:  PT Short Term Goal 1 (Week 3): STG = LTG due to estimated d/c date.  Skilled Therapeutic Interventions/Progress Updates:  Pt received in bed & agreeable to tx. Therapist dons/doffs RLE cam boot total assist. Pt requests to try to stand today and transfers to sitting EOB with mod I. Sit>stand with +2 assist & RW with therapist attempting to hold RLE off of ground & pt able to transfer to standing but unable to maintain NWB RLE & bears weight through foot. Pt quickly returns to sitting & reports 8/10 pain in RLE that decreases to 7/10 during session (pt is premedicated). Pt transfers bed>w/c via A/P method with encouragement to attempt task without physical assist & pt is able to complete transfer with extra time & supervision. Pt propels w/c room>nurses station with BUE & supervision for max of ~20 ft at a time. Pt tolerates sitting in gym with window blinds closed 2/2 fear of heights. Sitting in w/c, with 5# weighted bar pt performs BUE bicep curls, chest press, and overhead press with therapist providing instructional cuing for technique. Pt performs PNF diagonals with 2kg weighted ball with therapist providing instructional cuing for proper technique. Back in room pt requires assistance to elevate RLE onto bed but otherwise can perform A/P w/c>bed with supervision & extra time. At end of session pt left in bed with alarm set, 4 rails up (per pt request) & call bell & all needs in reach.   Therapy Documentation Precautions:  Precautions Precautions: Fall Precaution Comments: has anxiety with mobility Required Braces or Orthoses: Other Brace Other Brace: CAM boot to R LE Restrictions Weight Bearing Restrictions: Yes RLE Weight Bearing: Non weight  bearing   Therapy/Group: Individual Therapy  [name] 05/01/2020, 11:39 AM

## 2020-05-01 NOTE — Progress Notes (Signed)
Occupational Therapy Weekly Progress Note  Patient Details  Name: Jordan [name] MRN: [id number] Date of Birth: 1966-05-21  Beginning of progress report period: April 16, 2020 End of progress report period: May 01, 2020  Today's Date: 05/01/2020 OT Individual Time: 8182-9937 OT Individual Time Calculation (min): 72 min   Patient has met 0 of 3 short term goals.  Pt is making slow progress towards OT goals. Pt is very self limiting due to anxiety and can only be pushed so hard by therapy. Pt is progressing well with AP transfers that she is comfortable with, but has a hard time progressing to different transfers. Pt can also move better than what she thinks she can do, often requesting 2 person assist for AP transfer that she only needs min A or CGA of 1 person for. Pt's goals have been downgraded due to lack of progress towards mod I/supervision level goals.  Patient continues to demonstrate the following deficits: muscle weakness and decreased standing balance, decreased balance strategies and difficulty maintaining precautions and therefore will continue to benefit from skilled OT intervention to enhance overall performance with BADL and Reduce care partner burden.  Patient not progressing toward long term goals.  See goal revision..  Plan of care revisions: Goals downgraded to min A overall.  OT Short Term Goals Week 2:  OT Short Term Goal 1 (Week 2): Pt will complete 2 BSC transfers this week with 1 person assist OT Short Term Goal 1 - Progress (Week 2): Progressing toward goal OT Short Term Goal 2 (Week 2): Pt will complete 1 step of toileting task OT Short Term Goal 2 - Progress (Week 2): Progressing toward goal OT Short Term Goal 3 (Week 2): Pt will assist with donning 25% of CAM boot OT Short Term Goal 3 - Progress (Week 2): Progressing toward goal Week 3:  OT Short Term Goal 1 (Week 3): Pt will complete 2 BSC transfers this week with 1 person assist OT Short Term Goal 2 (Week 3): Pt  will complete 1 step of toileting task OT Short Term Goal 3 (Week 3): Pt will assist with donning 25% of CAM boot  Skilled Therapeutic Interventions/Progress Updates:    Pt greeted semi-reclined in bed and requesting to sponge bathe this am. OT asked pt if she was willing to try a shower, and she declined despite encouragement. Pt completed bed level sponge bath with set-up A and min A to wash buttocks thoroughly. Pt donned shirt and dressing with set-up  A and encouragement to try pulling shirt down behind back without assistance. Pt completed AP transfer from bed to bariatric wc with more than reasonable amount of time and min A. Pt with increasing anxiety during transfer, often yelling out because her leg hurt during transfer, but she did a better job managing her R LE today. Pt brought to the sink in wc for hair washing task. Pt able to hold wash basin while OT assisted with water management and rinsing hair. Pt completed further grooming tasks from wc at the sink. Pt then requested to return to bed and completed AP transfer back to bed with only OT assist to maintain wc position. Pt was able to scoot forward and manage her R LE at the same time. Pt left semi-reclined in bed with call bell in reach and needs met.    Therapy Documentation Precautions:  Precautions Precautions: Fall Precaution Comments: has anxiety with mobility Required Braces or Orthoses: Other Brace Other Brace: CAM boot to R LE  Restrictions Weight Bearing Restrictions: Yes RLE Weight Bearing: Non weight bearing Pain: Pain Assessment Pain Score: Asleep   Therapy/Group: Individual Therapy  Valma Cava 05/01/2020, 8:21 AM

## 2020-05-02 ENCOUNTER — Inpatient Hospital Stay (HOSPITAL_COMMUNITY): Payer: Medicaid Other | Admitting: Physical Therapy

## 2020-05-02 ENCOUNTER — Inpatient Hospital Stay (HOSPITAL_COMMUNITY): Payer: Medicaid Other | Admitting: Occupational Therapy

## 2020-05-02 ENCOUNTER — Inpatient Hospital Stay (HOSPITAL_COMMUNITY): Payer: Medicaid Other

## 2020-05-02 NOTE — Progress Notes (Signed)
Occupational Therapy Session Note  Patient Details  Name: Jordan [name] MRN: [id number] Date of Birth: [birth year]  Today's Date: 05/02/2020  Session 1 OT Individual Time: 3151-7616 OT Individual Time Calculation (min): 89 min   Session 2 OT Individual Time: 0737-1062 OT Individual Time Calculation (min): 69 min    Short Term Goals: Week 3:  OT Short Term Goal 1 (Week 3): Pt will complete 2 BSC transfers this week with 1 person assist OT Short Term Goal 2 (Week 3): Pt will complete 1 step of toileting task OT Short Term Goal 3 (Week 3): Pt will assist with donning 25% of CAM boot  Skilled Therapeutic Interventions/Progress Updates:    Session 1 Pt greeted semi-reclined in bed and agreeable to OT treatment session. Discussed showering this morning and pt agreeable as OT explained the process. Pt then reported need to urinate. Pt refused to get on [facility], even though we will be getting on roll in BSC/shower chair to get in the shower. Pt stated she could not wait. Pt rolled with supervision for bed pan placement. OT handed pt wash cloth and she was able to complete bed level peri-care with set-upA. Pt then completed AP transfer from bed to bariatric roll in shower chair/BSC. OT only had to hold the chair in place while pt was able to manage LLE and transfer. Min verbal cues for weight shifting with scooting. Pt less anxious today with this transfer than before. OT placed waterproof bag over R LE, then pushed roll in chair into bathroom. Pt able to bathe using LH sponge and wc cutout to wash peri-area and buttocks. Pt dried off in roll in shower chair, then completed AP transfer back to bed in similar fashion. OT fastened brief and had pt practice donning "underwear" using circle sitting In bed. Pt then rolled and needed OT assist to help advance brief past hips. Pt donned bra and dress with set-up A in bed. Pt left semi-reclined in bed with bed alarm on and needs met.    Session 2 Pt greeted  semi-reclined in bed. Pt reported need to urinate and felt she could not wait to get on [facility] despite continued encouragement from OT. Rolling with supervision for bed pan placement. Pt only able to void small amount of urine. Pt then rolled for bed pan removal in similar fashion. OT assistance to don R CAM boot. Pt then completed AP transfer from bed to bariatric wc with set-up A and wc hold from therapist. Worked on some wc propulsion in the room, then OT propelled wc to therapy gym for time management. OT ensured all blinds were closed in the gym as pt has a fear of heights. Pt completed 5 mins x2 on [employer] arm bike for UB there-ex. Pt returned to room at end of session and completed AP transfer back to bed in similar fashion as before. Pt left semi-reclined with boot on as pt has another PT session this afternoon. Bed alarm on and call bell in reach.    Therapy Documentation Precautions:  Precautions Precautions: Fall Precaution Comments: has anxiety with mobility Required Braces or Orthoses: Other Brace Other Brace: CAM boot to R LE Restrictions Weight Bearing Restrictions: Yes RLE Weight Bearing: Non weight bearing Pain: Pain Assessment Pain Scale: 0-10 Pain Score: 8  Pain Type: Acute pain Pain Location: Leg Pain Orientation: Right Pain Descriptors / Indicators: Aching Pain Onset: On-going Pain Intervention(s): Repositioned  Therapy/Group: Individual Therapy  Valma Cava 05/02/2020, 2:23 PM

## 2020-05-02 NOTE — Patient Care Conference (Signed)
Inpatient RehabilitationTeam Conference and Plan of Care Update Date: 05/02/2020   Time: 3:03 PM    Patient Name: Jordan [name]      Medical Record Number: [id number]  Date of Birth: 1966-08-20 Sex: Female         Room/Bed: 4W09C/4W09C-01 Payor Info: Payor: MEDICAID [place] / Plan: MEDICAID [place] ACCESS / Product Type: *No Product type* /    Admit Date/Time:  04/15/2020  4:22 PM  Primary Diagnosis:  Tibia/fibula fracture, right, sequela  Hospital Problems: Principal Problem:   Tibia/fibula fracture, right, sequela Active Problems:   Bipolar disorder ([place])   Anxiety state   Acute blood loss anemia   Leukopenia   Vitamin D deficiency   Acute lower UTI   Postoperative pain    Expected Discharge Date: Expected Discharge Date:  (SNF Placement)  Team Members Present: Physician leading conference: Dr. Alger Simons Care Coodinator Present: Loralee Pacas, LCSWA;Stacey Creig Hines, RN, BSN, [place] Nurse Present: Dwaine Gale, RN PT Present: Lavone Nian, PT OT Present: Cherylynn Ridges, OT SLP Present: Weston Anna, SLP PPS Coordinator present : Gunnar Fusi, SLP     Current Status/Progress Goal Weekly Team Focus  Bowel/Bladder   Patient is continent x2; last BM 7/11; UTI on Macrobid  Maintain continence  Assess every shift and as needed; Contact isolation precautions   Swallow/Nutrition/ Hydration             ADL's   Min A/supervision AP transfer to wc, has refused BSC transfer often, set-up UB ADLs, Min/mod A LB ADLs  Goals downgraded to min A overall  anxiety and pain management, dc planning, BSC transfers, self-care retraining   Mobility   mod I bed mobility, supervision A/P transfer bed<>w/c, max distance 60 ft w/c mobility with min assist, unable to stand & maintain weight bearing precautions  supervision w/c mobility, set up assist bed<>w/c transfer, mod I bed mobility,  d/c planning, transfers, strengthening, pt education, weight bearing precautions, w/c mobility    Communication             Safety/Cognition/ Behavioral Observations            Pain   RLE pain 8/10, prn given per [facility] and repositioning  pain <5  assess every shift and as needed; assess need for repositioning   Skin   Ace wrap/foam to surgical incision; daily dressing change  prevent infection  assess every shift and as needed     Team Discussion:  Discharge Planning/Teaching Needs:  Potential d/c to SNF pending if pt will have a financial responsibility.  Family education as recommended by therapy   Current Update:  none  Current Barriers to Discharge:  Weight bearing restrictions and anxiety.  Possible Resolutions to Barriers: Continue to work with pt on understanding level of weight bearing restrictions. Maintain anti-anxiety medication regimen.  Patient on target to meet rehab goals: yes, Continent B/B, skin healing. Pt progressing at her own pace and comfort zone. Pt is self limiting. Unable to maintain weight bearing precautions. Waiting on [facility] to call patient.  *See Care Plan and progress notes for long and short-term goals.   Revisions to Treatment Plan:  none    Medical Summary Current Status: right leg pain, anxiety issues. RX UTI. moving bowels, more formed Weekly Focus/Goal: pain mgt, rx of UTI  Barriers to Discharge: Behavior;Medical stability   Possible Resolutions to Barriers: rx UTI, wound care   Continued Need for Acute Rehabilitation Level of Care: The patient requires daily medical management by a physician  with specialized training in physical medicine and rehabilitation for the following reasons: Direction of a multidisciplinary physical rehabilitation program to maximize functional independence : Yes Medical management of patient stability for increased activity during participation in an intensive rehabilitation regime.: Yes Analysis of laboratory values and/or radiology reports with any subsequent need for medication adjustment and/or  medical intervention. : Yes   I attest that I was present, lead the team conference, and concur with the assessment and plan of the team.   Cristi Loron 05/02/2020, 3:03 PM

## 2020-05-02 NOTE — Progress Notes (Signed)
[place] PHYSICAL MEDICINE & REHABILITATION PROGRESS NOTE   Subjective/Complaints: No new issues. Bladder less frequent. Stools more formed. Pain still a barrier with mobility  ROS: Patient denies fever, rash, sore throat, blurred vision, nausea, vomiting, diarrhea, cough, shortness of breath or chest pain,   headache, or mood change.    Objective:   No results found. No results for input(s): WBC, HGB, HCT, PLT in the last 72 hours. No results for input(s): NA, K, CL, CO2, GLUCOSE, BUN, CREATININE, CALCIUM in the last 72 hours.  Intake/Output Summary (Last 24 hours) at 05/02/2020 1130 Last data filed at 05/02/2020 0550 Gross per 24 hour  Intake 120 ml  Output 2100 ml  Net -1980 ml     Physical Exam: Vital Signs Blood pressure 114/61, pulse 89, temperature 97.7 F (36.5 C), temperature source Oral, resp. rate 19, height 5\' 8"  (1.727 m), weight (!) 147.6 kg, SpO2 99 %. Constitutional: No distress . Vital signs reviewed. HEENT: EOMI, oral membranes moist Neck: supple Cardiovascular: RRR without murmur. No JVD    Respiratory/Chest: CTA Bilaterally without wheezes or rales. Normal effort    GI/Abdomen: BS +, non-tender, non-distended Ext: no clubbing, cyanosis, or edema Psych: pleasant and cooperative Musc: Right distal lower extremity edema much improved Neuro:  Alert Motor: Bilateral upper extremities: 5/5 proximal distal Left lower extremity: 5/5 proximal distal Right lower extremity: Limited by pain proximally, ankle dorsiflexion 4/5, unchanged Skin: all wounds RLE clean and dry, foam dressing on ONE wound  Assessment/Plan: 1. Functional deficits secondary to RLE tib/fib fx s/p ex-fix and ORIF_ NWB on RLE which require 3+ hours per day of interdisciplinary therapy in a comprehensive inpatient rehab setting.  Physiatrist is providing close team supervision and 24 hour management of active medical problems listed below.  Physiatrist and rehab team continue to assess  barriers to discharge/monitor patient progress toward functional and medical goals  Care Tool:  Bathing  Bathing activity did not occur:  (N/A) Body parts bathed by patient: Right arm, Left arm, Chest, Abdomen, Right upper leg, Left upper leg, Face, Left lower leg, Right lower leg   Body parts bathed by helper: Buttocks Body parts n/a: Front perineal area, Buttocks   Bathing assist Assist Level: Set up assist     Upper Body Dressing/Undressing Upper body dressing   What is the patient wearing?: Hospital gown only    Upper body assist Assist Level: Set up assist    Lower Body Dressing/Undressing Lower body dressing      What is the patient wearing?: Incontinence brief     Lower body assist Assist for lower body dressing: Moderate Assistance - Patient 50 - 74%     Toileting Toileting Toileting Activity did not occur Landscape architect and hygiene only): N/A (no void or bm)  Toileting assist Assist for toileting: Minimal Assistance - Patient > 75%     Transfers Chair/bed transfer  Transfers assist  Chair/bed transfer activity did not occur: Safety/medical concerns (unable to maintain R LE NWB)  Chair/bed transfer assist level: Supervision/Verbal cueing (A/P) Chair/bed transfer assistive device: Armrests   Locomotion Ambulation   Ambulation assist   Ambulation activity did not occur: Safety/medical concerns (unable to maintain R LE NWB in standing)          Walk 10 feet activity   Assist  Walk 10 feet activity did not occur: Safety/medical concerns        Walk 50 feet activity   Assist Walk 50 feet with 2 turns activity did not  occur: Safety/medical concerns         Walk 150 feet activity   Assist Walk 150 feet activity did not occur: Safety/medical concerns         Walk 10 feet on uneven surface  activity   Assist Walk 10 feet on uneven surfaces activity did not occur: Safety/medical concerns          Wheelchair     Assist Will patient use wheelchair at discharge?: Yes Type of Wheelchair: Manual    Wheelchair assist level: Supervision/Verbal cueing Max wheelchair distance: 20 ft    Wheelchair 50 feet with 2 turns activity    Assist    Wheelchair 50 feet with 2 turns activity did not occur:  (decreased activity tolerance)   Assist Level: Maximal Assistance - Patient 25 - 49%   Wheelchair 150 feet activity     Assist  Wheelchair 150 feet activity did not occur:  (decreased activity tolerance)   Assist Level: Total Assistance - Patient < 25%   Blood pressure 114/61, pulse 89, temperature 97.7 F (36.5 C), temperature source Oral, resp. rate 19, height 5\' 8"  (1.727 m), weight (!) 147.6 kg, SpO2 99 %.  Medical Problem List and Plan: 1.  Impaired function secondary to RLE tib/fib comminuted fxs after fall-ORIF 6/21 by Dr. Doreatha Martin is NWB on RLE  Continue CIR, pending SNF  2.  Antithrombotics: -DVT/anticoagulation:  Pharmaceutical: Lovenox  -dopplers clear             -antiplatelet therapy: N/A 3. Pain Management: prn oxycodone   -gabapentin 100mg  tid  -ice/heat, edmea control, reassurance  Controlled at rest  4. Mood: LCSW to follow for evaluation and support.              -antipsychotic agents: On Latuda  -evaluation pending with Dr. Sima Matas re: anxiety,? PTSD in setting of her baseline bipolar disorder   -continue trileptal and latuda per baseline   -needs regular reinforcement and egosupport 5. Neuropsych: This patient is capable of making decisions on her own behalf. 6. Skin/Wound Care: Routine pressure relief measures.    -ACE wrap and elevation for edema control.  7. Fluids/Electrolytes/Nutrition: encourage po.   9. H/o Nocturia/frequent UTIs: On ditropan.   -oob to void  -urine cx + 100K E Coli (ESBL), klebsiella   -contact precautions   -sensitive to Macrobid --->7 day rx, continent of urine       -ucx 100k Klebsiella and now  serratia    -continue Macrobid     -received fosfomycin x 1 dose 10. Constipation:    Improved 11.  Vitamin D deficiency  Supplement 12.  Leukopenia  WBC 3.3 on 7/7, labs pending for wednesday 13.  Acute blood loss anemia  Hemoglobin 10.6 on 7/7, labs pending for wednesday  LOS: 17 days A FACE TO [place] 05/02/2020, 11:30 AM

## 2020-05-02 NOTE — Progress Notes (Signed)
Occupational Therapy Discharge Summary  Patient Details  Name: Jordan [name] MRN: [id number] Date of Birth: 02-05-1966  Patient has met 6 of 6 long term goals due to improved activity tolerance, improved balance and ability to compensate for deficits.  Patient to discharge at [facility] Assist level.  Patient's care partner unavailable to provide the necessary physical assistance at discharge, therefore, pt to dc to SNF for continued rehabilitation.   Reasons goals not met: n/a  Recommendation:  Patient will benefit from ongoing skilled OT services in skilled nursing facility setting to continue to advance functional skills in the area of BADL, iADL and Reduce care partner burden.  Equipment: No equipment provided   Reasons for discharge: treatment goals met and discharge from hospital  Patient/family agrees with progress made and goals achieved: Yes  OT Discharge Precautions/Restrictions  Precautions Precautions: Fall Precaution Comments: has anxiety with mobility Required Braces or Orthoses: Other Brace Splint/Cast: RLE Other Brace: CAM boot to R LE Restrictions Weight Bearing Restrictions: Yes RLE Weight Bearing: Non weight bearing Pain Pain Assessment Pain Scale: 0-10 Pain Score: 8  Pain Type: Acute pain Pain Location: Leg Pain Orientation: Right Pain Descriptors / Indicators: Aching Pain Onset: On-going Pain Intervention(s): Repositioned ADL ADL Eating: Independent Grooming: Independent Where Assessed-Grooming: Edge of bed Upper Body Bathing: Setup Where Assessed-Upper Body Bathing: Edge of bed Lower Body Bathing: Minimal assistance Where Assessed-Lower Body Bathing: Edge of bed, Bed level Upper Body Dressing: Setup Where Assessed-Upper Body Dressing: Edge of bed Lower Body Dressing: Minimal assistance Where Assessed-Lower Body Dressing: Bed level Toileting: Minimal assistance Toilet Transfer: Close supervision, Directs care (Comment) (set-up ) Tub/Shower  Transfer: Not assessed Perception  Perception: Within Functional Limits Praxis Praxis: Intact Cognition Overall Cognitive Status: History of cognitive impairments - at baseline (history of anxiety and bipolar disorder) Arousal/Alertness: Awake/alert Orientation Level: Oriented X4 Memory: Appears intact Problem Solving: Impaired (limited by anxiety with mobility) Problem Solving Impairment: Functional basic Behaviors:  (anxious) Safety/Judgment: Impaired Sensation Sensation Light Touch: Appears Intact Proprioception: Appears Intact Coordination Gross Motor Movements are Fluid and Coordinated: No Fine Motor Movements are Fluid and Coordinated: Yes Coordination and Movement Description: Affected by pain, body habitus, and functional limitations due to NWB status on R LE Motor  Motor Motor: Other (comment) Motor - Discharge Observations: generalized weakness continued, improved since eval Mobility  Bed Mobility Bed Mobility: Rolling Right;Rolling Left;Supine to Sit;Sit to Supine Rolling Right: Supervision/verbal cueing Rolling Left: Supervision/Verbal cueing Supine to Sit: Supervision/Verbal cueing Sit to Supine: Supervision/Verbal cueing  Balance Static Sitting Balance Static Sitting - Balance Support: No upper extremity supported;Feet unsupported Static Sitting - Level of Assistance: 7: Independent Dynamic Sitting Balance Dynamic Sitting - Level of Assistance: 6: Modified independent (Device/Increase time) Static Standing Balance Static Standing - Balance Support: During functional activity Static Standing - Level of Assistance: 6: Modified independent (Device/Increase time) Extremity/Trunk Assessment RUE Assessment RUE Assessment: Within Functional Limits LUE Assessment LUE Assessment: Within Functional Limits   Daneen Schick Krystal Delduca 05/02/2020, 2:29 PM

## 2020-05-02 NOTE — Progress Notes (Signed)
Physical Therapy Discharge Summary  Patient Details  Name: Jordan [name] MRN: [id number] Date of Birth: [birth year]  Today's Date: 05/03/2020   Patient has met 6 of 7 long term goals due to improved activity tolerance, improved balance, improved postural control and increased strength.  Patient to discharge at a wheelchair level supervision for w/c mobility, set up assist for A/P transfers..   Patient's does not have any caregivers that can assist at d/c.  Reasons goals not met: Pt did not meet goal of Supervision for transfers as she can require up to min A for RLE management during A/P transfers OOB.  Recommendation:  Patient will benefit from ongoing skilled PT services in skilled nursing facility setting to continue to advance safe functional mobility, address ongoing impairments in transfers, w/c mobility, gait as able, and minimize fall risk.  Equipment: TBD in next venue of care (pt currently requires hospital bed & bariatric w/c)  Reasons for discharge: discharge from hospital  Patient/family agrees with progress made and goals achieved: Yes  PT Discharge Precautions/Restrictions Precautions Precautions: Fall Precaution Comments: has anxiety with mobility Required Braces or Orthoses: Other Brace Splint/Cast: RLE Other Brace: CAM boot to R LE Restrictions Weight Bearing Restrictions: Yes RLE Weight Bearing: Non weight bearing  Vision/Perception  Pt wears glasses at all times. No changes in baseline vision. Perception Perception: Within Functional Limits Praxis Praxis: Intact   Cognition Overall Cognitive Status: History of cognitive impairments - at baseline (history of anxiety and bipolar disorder) Arousal/Alertness: Awake/alert Orientation Level: Oriented X4 Memory: Appears intact Problem Solving: Impaired (limited by anxiety with mobility) Problem Solving Impairment: Functional basic Behaviors:  (anxious) Safety/Judgment: Impaired  Sensation Sensation Light  Touch: Appears Intact Proprioception: Appears Intact Coordination Gross Motor Movements are Fluid and Coordinated: No Fine Motor Movements are Fluid and Coordinated: Yes Coordination and Movement Description: Affected by pain, body habitus, and functional limitations due to NWB status on R LE  Motor  Motor Motor: Other (comment) Motor - Discharge Observations: generalized weakness continued, improved since eval   Mobility Bed Mobility Bed Mobility: Rolling Right;Rolling Left;Supine to Sit;Sit to Supine (hospital bed features) Rolling Right: Independent with assistive device Rolling Left: Independent with assistive device Supine to Sit: Independent with assistive device Sit to Supine: Independent with assistive device Transfers Transfers: Anterior-Posterior Transfer Sit to Stand: 2 Helpers (pt has attempted sit<>stand from EOB but requires max assist +2 & is unable to maintain NWB RLE) Stand to Sit: 2 Helpers (pt has attempted sit<>stand from EOB but requires max assist +2 & is unable to maintain NWB RLE) Anterior-Posterior Transfer: Set up assist (pt prefers to have slide board under w/c cushion to connect w/c to bed)  Locomotion  Gait Ambulation: No Gait Gait: No Stairs / Additional Locomotion Stairs: No Wheelchair Mobility Wheelchair Mobility: Yes Wheelchair Assistance: Chartered loss adjuster: Both upper extremities Wheelchair Parts Management: Needs assistance Distance: 30 ft   Trunk/Postural Assessment  Thoracic Assessment Thoracic Assessment: Exceptions to [facility] (rounded shoulders) Postural Control Postural Control: Deficits on evaluation Righting Reactions: delayed Protective Responses: delayed   Balance Balance Balance Assessed: Yes Static Sitting Balance Static Sitting - Balance Support: Feet supported;No upper extremity supported Static Sitting - Level of Assistance: 7: Independent Dynamic Sitting Balance Dynamic Sitting - Balance  Support: No upper extremity supported;Feet supported Dynamic Sitting - Level of Assistance: 6: Modified independent (Device/Increase time)  Extremity Assessment  RUE Assessment RUE Assessment: Within Functional Limits LUE Assessment LUE Assessment: Within Functional Limits RLE Assessment RLE Assessment: Exceptions to [facility]  Active Range of Motion (AROM) Comments: hip & knee flexion WFL, pt beginning to perform gentle ROM to R ankle to tolerance General Strength Comments: not formally tested 2/2 weight bearing restrictions LLE Assessment LLE Assessment: Within Functional Limits    Lavone Nian, PT, DPT 05/03/2020, 12:05 PM  [name] 05/03/2020, 12:05 PM

## 2020-05-02 NOTE — Plan of Care (Signed)
  Problem: Consults Goal: RH GENERAL PATIENT EDUCATION Description: See Patient Education module for education specifics. Outcome: Progressing   Problem: RH BOWEL ELIMINATION Goal: RH STG MANAGE BOWEL WITH ASSISTANCE Description: STG Manage Bowel with mod I Assistance. Outcome: Progressing Goal: RH STG MANAGE BOWEL W/MEDICATION W/ASSISTANCE Description: STG Manage Bowel with Medication with min Assistance. Outcome: Progressing   Problem: RH BLADDER ELIMINATION Goal: RH STG MANAGE BLADDER WITH ASSISTANCE Description: STG Manage Bladder With mod I Assistance Outcome: Progressing   Problem: RH SKIN INTEGRITY Goal: RH STG MAINTAIN SKIN INTEGRITY WITH ASSISTANCE Description: STG Maintain Skin Integrity With min Assistance. Outcome: Progressing Goal: RH STG ABLE TO PERFORM INCISION/WOUND CARE W/ASSISTANCE Description: STG Able To Perform Incision/Wound Care With min Assistance. Outcome: Progressing   Problem: RH SAFETY Goal: RH STG ADHERE TO SAFETY PRECAUTIONS W/ASSISTANCE/DEVICE Description: STG Adhere to Safety Precautions With cues/reminders Assistance/Device. Outcome: Progressing   Problem: RH PAIN MANAGEMENT Goal: RH STG PAIN MANAGED AT OR BELOW PT'S PAIN GOAL Description: Less than 4 on 0-10 scale  Outcome: Progressing

## 2020-05-02 NOTE — Progress Notes (Signed)
Physical Therapy Session Note  Patient Details  Name: Jordan [name] MRN: [id number] Date of Birth: 1966/09/01  Today's Date: 05/02/2020 PT Individual Time: 1441-1535 PT Individual Time Calculation (min): 54 min   Short Term Goals: Week 3:  PT Short Term Goal 1 (Week 3): STG = LTG due to estimated d/c date.  Skilled Therapeutic Interventions/Progress Updates:  Pt received in bed & agreeable to tx. Pt reports 9/10 pain in R ankle but is premedicated. Pt transfers bed<>w/c via A/P transfer with set up assist & extra time, as well as encouragement due to anxiety during transfer. Once in w/c, transported pt to hall by gym for time management. Pt propelled w/c between cones with BUE & supervision with max cuing to attend to cones & not hit them with pt demonstrating poor awareness during task. Back in room, pt returned to bed & doffed cam boot sitting EOB with assistance to remove 1 strap only, which is a great improvement today! Back in bed pt performs R ankle dorsiflexion/plantarflexion within tolerable range but at the end pt reports "it feels like it's pulling my leg" & increasing her pain so exercises ended. At end of session pt left in bed with alarm set & all needs in reach, 4 rails up per pt request.  Pt reporting she's willing to attempt to stand today but not without +2 assist & no +2 available.   Therapy Documentation Precautions:  Precautions Precautions: Fall Precaution Comments: has anxiety with mobility Required Braces or Orthoses: Other Brace Splint/Cast: RLE Other Brace: CAM boot to R LE Restrictions Weight Bearing Restrictions: Yes RLE Weight Bearing: Non weight bearing   Therapy/Group: Individual Therapy  [name] 05/02/2020, 4:06 PM

## 2020-05-02 NOTE — Progress Notes (Signed)
This chaplain was present with Pt. bedside for F/U spiritual care.  The Pt. shared with the chaplain she prefers afternoon visits. The Pt. valued the spiritual care relationship and explained the conversations are calming at the end of the day.  The Pt. shared with the chaplain examples of how she is encouraged from verbal positive reinforcement when she meets a challenge. The chaplain will F/U and practice reflective listening as the Pt. discerns her role in a relationship.

## 2020-05-02 NOTE — Progress Notes (Addendum)
Physical Therapy Session Note  Patient Details  Name: Jordan [name] MRN: [id number] Date of Birth: 21-Jun-1966  Today's Date: 05/02/2020 PT Individual Time: 1035-1100 PT Individual Time Calculation (min): 30 min   Short Term Goals: Week 3:  PT Short Term Goal 1 (Week 3): STG = LTG due to estimated d/c date.   Skilled Therapeutic Interventions/Progress Updates:     Patient in bed with Chaplain at bedside upon PT arrival. Requested a few minutes to finish discussion with the Chaplain prior to therapy. Patient missed 5 min of skilled PT, RN made aware. Will attempt to make-up missed time as able.  Upon PT return, patient alert and agreeable to PT session. Patient reported unrated R LE pain during session, RN made aware. PT provided repositioning, rest breaks, and distraction as pain interventions throughout session. Patient with questions about when she will be able to weight bear on her foot. No time frame provided per brief chart review, will reach out to lead therapist and MD to answer patient's question. Patient declined getting OOB due to fear of bladder incontinence while seated in w/c. Focused session on sitting balance and LE exercise seated EOB.  Therapeutic Activity: Bed Mobility: Patient performed supine to/from sit with mod I using hospital bed features. Provided verbal cues for reduced use of bed rails and elevating HOB to improve functional mobility, patient in agreement to work on this, but did not reduce use of bed features this session.  Therapeutic Exercise: Patient performed the following exercises seated EOB with B UE support for balance with verbal and tactile cues for proper technique. -B LAQ 2x15 -seated marching 2x15 -R gentle ankle rolls in pain free range -R DF/PF gentle AROM in pain free range  Patient in bed at end of session with breaks locked, bed alarm set, and all needs within reach.    Therapy Documentation Precautions:  Precautions Precautions:  Fall Precaution Comments: has anxiety with mobility Required Braces or Orthoses: Other Brace Splint/Cast: RLE Other Brace: CAM boot to R LE Restrictions Weight Bearing Restrictions: Yes RLE Weight Bearing: Non weight bearing    Therapy/Group: Individual Therapy  Kema Santaella L Margaux Engen PT, DPT  05/02/2020, 6:20 PM

## 2020-05-03 ENCOUNTER — Inpatient Hospital Stay (HOSPITAL_COMMUNITY): Payer: Medicaid Other | Admitting: Occupational Therapy

## 2020-05-03 ENCOUNTER — Inpatient Hospital Stay (HOSPITAL_COMMUNITY): Payer: Medicaid Other | Admitting: Physical Therapy

## 2020-05-03 LAB — CBC
HCT: 34.5 % — ABNORMAL LOW (ref 36.0–46.0)
Hemoglobin: 11 g/dL — ABNORMAL LOW (ref 12.0–15.0)
MCH: 31.8 pg (ref 26.0–34.0)
MCHC: 31.9 g/dL (ref 30.0–36.0)
MCV: 99.7 fL (ref 80.0–100.0)
Platelets: 212 10*3/uL (ref 150–400)
RBC: 3.46 MIL/uL — ABNORMAL LOW (ref 3.87–5.11)
RDW: 14.7 % (ref 11.5–15.5)
WBC: 3.8 10*3/uL — ABNORMAL LOW (ref 4.0–10.5)
nRBC: 0 % (ref 0.0–0.2)

## 2020-05-03 LAB — BASIC METABOLIC PANEL
Anion gap: 14 (ref 5–15)
BUN: 16 mg/dL (ref 6–20)
CO2: 23 mmol/L (ref 22–32)
Calcium: 8.8 mg/dL — ABNORMAL LOW (ref 8.9–10.3)
Chloride: 101 mmol/L (ref 98–111)
Creatinine, Ser: 0.6 mg/dL (ref 0.44–1.00)
GFR calc Af Amer: >60 mL/min (ref >60)
GFR calc non Af Amer: >60 mL/min (ref >60)
Glucose, Bld: 84 mg/dL (ref 70–99)
Potassium: 4.3 mmol/L (ref 3.5–5.1)
Sodium: 138 mmol/L (ref 135–145)

## 2020-05-03 LAB — SARS CORONAVIRUS 2 (TAT 6-24 HRS): SARS Coronavirus 2: NEGATIVE

## 2020-05-03 NOTE — Progress Notes (Signed)
Physical Therapy Session Note  Patient Details  Name: Jordan [name] MRN: [id number] Date of Birth: 11/22/1965  Today's Date: 05/03/2020 PT Individual Time: 2641-5830 PT Individual Time Calculation (min): 54 min   Short Term Goals: Week 3:  PT Short Term Goal 1 (Week 3): STG = LTG due to estimated d/c date.  Skilled Therapeutic Interventions/Progress Updates:  Pt received in bed & agreeable to tx. Pt dons cam boot with min assist & extra time but ultimately requires max assist to readjust straps for increased tightness. Pt requests to try to stand today and is willing to attempt with PT assist only.  Pt completes bed mobility with mod I & hospital bed features. Pt attempts sit>stand from EOB multiple times with PT holding RLE off of ground as much as possible but pt unable to generate enough strength to push up & clear buttocks from EOB. Pt reports need to void & when asked about BSC reports she won't make it there so returned supine & bed pan placed. Pt with continent void & therapist provides assistance for donning fresh brief. Pt transfers to sitting EOB & attempts to perform lateral scoots in preparation for slide board transfers with therapist intermittently providing assistance to keep RLE off of ground. Therapist provides cuing for hand placement, anterior weight shifting, and head/hips relationship but pt with great difficulty shifting weight forward then scooting along EOB. Pt relies heavily on bed rails and demonstrates improved ability to scoot. Pt returns to bed & doffs cam boot with supervision & extra time. Pt voices anxiety over d/c to new facility with PT providing encouragement. Pt left in bed with alarm set, call bell & all needs in reach, 4 rails up per pt request.   Therapy Documentation Precautions:  Precautions Precautions: Fall Precaution Comments: has anxiety with mobility Required Braces or Orthoses: Other Brace Splint/Cast: RLE Other Brace: CAM boot to R  LE Restrictions Weight Bearing Restrictions: Yes RLE Weight Bearing: Non weight bearing  Pain: 8/10 R ankle - pain meds administered during session  Therapy/Group: Individual Therapy  [name] 05/03/2020, 2:15 PM

## 2020-05-03 NOTE — Progress Notes (Signed)
Offered patient PRN bowel medication due to no bowel movement in 3 days. Patient refused, stating "I'll go on my own whenever I go."

## 2020-05-03 NOTE — Progress Notes (Signed)
Orthopaedic Trauma Progress Note  S: Doing well.  Swelling in right lower extremity improving.  Pain improving. Noted small amount of bloody drainage from RLE traumatic wound on Sunday, no issues since then. Awaiting acceptance to SNF  O:  General: Sitting up in bed, NAD RLE: All incisions clean, dry, intact. Traumatic wound to anterior tibia stable. No drainage. Ankle dorsiflexion/plantarflexion intact. Endorses sensation to light touch throughout extremity + DP pulse  Imaging: Stable post op imaging   Assessment: 54 year old female s/p fall Injuries: Right type 2 open tibia fracture s/p I&D and ORIF 04/10/20  Plan: Weightbearing: NWB RLE Incisional and dressing care: Okay to leave incision open to air. Wound recommend Large sock when boot in place. Orthopedic device(s): CAM walking boot. Okay to come out of boot at rest for ankle motion. Should wear boot anytime she is up with therapies VTE prophylaxis: Lovenox 40 mg daily until 05/11/20 Impediments to Fracture Healing: Open fracture. Vit D level 22, continueon D3 1000 units daily Dispo: Continue with therapies. If any drainage is noted from RLE wound please contact OTS immediately Follow - up plan: 2 weeks after hospital discharge   Aliza Moret A. Carmie [facility] [phone] (office) [email or web address]

## 2020-05-03 NOTE — Progress Notes (Signed)
Patient ID: Jordan [name], female   DOB: 05/21/1966, 53 y.o.   MRN: [id number]   05/02/2020-SW received updates from [facility] that accounts receivable will follow-up with pt about if there will be any costs with her placement.  SW met with pt in room who reported she did speak with them but was still unclear on if there would be any costs associated with placement. SW waiting on updates.  05/03/2020- SW met with pt in room for conference call with [facility] Admissions Coordinator to discuss placement process and terms/conditions of being in rehab for 30 days in which her therapies will be paid. Pt agreed to stay in SNF for 30 days. Potential d/c tomorrow pending if they are able to order a bed for patient. SW waiting on follow-up.     , MSW, LCSWA Office: [phone] Cell: [phone] Fax: [phone] 

## 2020-05-03 NOTE — Progress Notes (Signed)
Occupational Therapy Session Note  Patient Details  Name: Jordan [name] MRN: [id number] Date of Birth: [birth year]  Today's Date: 05/03/2020 OT Individual Time: 0700-0800 and 1030-1111 OT Individual Time Calculation (min): 60 min and 41 mins   Short Term Goals: Week 2:  OT Short Term Goal 1 (Week 2): Pt will complete 2 BSC transfers this week with 1 person assist OT Short Term Goal 1 - Progress (Week 2): Progressing toward goal OT Short Term Goal 2 (Week 2): Pt will complete 1 step of toileting task OT Short Term Goal 2 - Progress (Week 2): Progressing toward goal OT Short Term Goal 3 (Week 2): Pt will assist with donning 25% of CAM boot OT Short Term Goal 3 - Progress (Week 2): Progressing toward goal  Skilled Therapeutic Interventions/Progress Updates:    Session 1: Upon entering the room, pt seated in bed and eating breakfast. Pt agreeable to OT intervention. Pt bathing self from bed level with set up A this session secondary to time management. Pt able to wash B feet in circle sitting position. Pt donning pull over dress with set up A as well. Pt remained in seated position with call  Bell and all needed items within reach.   Session 2: Upon entering the room, pt seated in bed with no c/o pain. Pt donned B non slip socks and was able to donn/doff R CAM book with increased time this session. Pt sitting on EOB with supervision and able to demonstrate lateral scooting up bed with increased time and min cuing for technique. OT holding R LE to insure no weight being beared. OT discussed this was in preparation to try lateral slide board transfer and pt verbalized understanding. Pt performing sit >supine without assistance as well. Pt removed CAM boot independently and remained in bed as therapist exited the room. Call bell and all needed items within reach.   Therapy Documentation Precautions:  Precautions Precautions: Fall Precaution Comments: has anxiety with mobility Required Braces or  Orthoses: Other Brace Splint/Cast: RLE Other Brace: CAM boot to R LE Restrictions Weight Bearing Restrictions: Yes RLE Weight Bearing: Non weight bearing Vital Signs: Therapy Vitals Temp: 97.7 F (36.5 C) Temp Source: Axillary Pulse Rate: 80 Resp: 20 BP: 116/65 Patient Position (if appropriate): Lying Oxygen Therapy SpO2: 100 % O2 Device: Room Air ADL: ADL Eating: Independent Grooming: Independent Where Assessed-Grooming: Edge of bed Upper Body Bathing: Setup Where Assessed-Upper Body Bathing: Edge of bed Lower Body Bathing: Minimal assistance Where Assessed-Lower Body Bathing: Edge of bed, Bed level Upper Body Dressing: Setup Where Assessed-Upper Body Dressing: Edge of bed Lower Body Dressing: Minimal assistance Where Assessed-Lower Body Dressing: Bed level Toileting: Minimal assistance Toilet Transfer: Close supervision, Directs care (Comment) (set-up ) Tub/Shower Transfer: Not assessed   Therapy/Group: Individual Therapy  Gypsy Decant 05/03/2020, 8:52 AM

## 2020-05-03 NOTE — Discharge Summary (Signed)
Physician Discharge Summary  Patient ID: Jordan [name] MRN: [id number] DOB/AGE: February 01, 1966 54 y.o.  Admit date: 04/15/2020 Discharge date: 05/04/2020  Discharge Diagnoses:  Principal Problem:   Tibia/fibula fracture, right, sequela Active Problems:   Bipolar disorder (HCC)   Anxiety state   Acute blood loss anemia   Leukopenia   Vitamin D deficiency   Acute lower UTI   Postoperative pain   Discharged Condition: stable   Significant Diagnostic Studies: DG Tibia/Fibula Right  Result Date: 04/10/2020 CLINICAL DATA:  54 year old female with ORIF of the distal tibial fracture. EXAM: RIGHT TIBIA AND FIBULA - 2 VIEW; DG C-ARM 1-60 MIN COMPARISON:  Prior radiograph dated 04/09/2020. FINDINGS: Seven intraoperative fluoroscopic spot images provided. The total fluoroscopy time is 28.4 seconds. Comminuted and displaced fracture of the distal tibial diaphysis as well as displaced fracture of the distal fibula. Displaced fracture of the medial malleolus. Interval placement of a fixation sideplate and screw along the distal tibia. IMPRESSION: Intraoperative fluoroscopy for internal fixation of the distal tibia. Electronically Signed   By: Anner Crete M.D.   On: 04/10/2020 15:41   DG Tibia/Fibula Right  Result Date: 04/09/2020 CLINICAL DATA:  Fractures of the distal tibia and fibula. EXAM: RIGHT TIBIA AND FIBULA - 2 VIEW COMPARISON:  Tibia and fibula radiographs performed the same day. FINDINGS: Intraoperative fluoroscopy images demonstrate external fixation hardware with screws in the proximal tibia and calcaneus. The comminuted tibia and fibula fractures are in improved alignment. The hardware appears intact. IMPRESSION: Improved alignment of the comminuted tibia and fibula fractures after external fixation. Electronically Signed   By: Zerita Boers M.D.   On: 04/09/2020 15:41   CT ANKLE RIGHT WO CONTRAST  Result Date: 04/09/2020 CLINICAL DATA:  Right ankle fracture, status post external fixator  EXAM: CT OF THE RIGHT ANKLE WITHOUT CONTRAST TECHNIQUE: Multidetector CT imaging of the right ankle was performed according to the standard protocol. Multiplanar CT image reconstructions were also generated. COMPARISON:  None. FINDINGS: Bones/Joint/Cartilage There is a comminuted mildly displaced fracture seen of the distal tibia with small fracture fragment seen displaced. There is also obliquely oriented comminuted fracture of the distal fibular shaft. There is a mildly displaced medial malleolar fracture is well. A nondisplaced fracture seen through the lateral talar dome extending anteriorly through the midbody. The ankle mortise appears to be slightly widened measuring 9 mm in diameter. Ligaments Suboptimally assessed by CT. Muscles and Tendons The muscles surrounding the ankle appear to be grossly intact without evidence of focal atrophy or tear. The visualized portions of the tendons are intact. The Achilles tendon is intact. Soft tissues External fixation seen along the hindfoot. There is diffuse subcutaneous edema surrounding the ankle. A moderate ankle joint effusion is noted. IMPRESSION: Comminuted mildly displaced fracture seen through the distal tibia and fibular shafts. Mildly displaced medial malleolar fracture with widening of the ankle mortise measuring 9 mm. Nondisplaced fracture seen through the anterolateral talar dome and midbody. Electronically Signed   By: Prudencio Pair M.D.   On: 04/09/2020 22:11   DG Tibia/Fibula Right Port  Result Date: 04/26/2020 CLINICAL DATA:  Status post open reduction and internal fixation of right tibial and fibular fractures. EXAM: PORTABLE RIGHT TIBIA AND FIBULA - 2 VIEW COMPARISON:  April 10, 2020. FINDINGS: Status post surgical internal fixation of comminuted and displaced distal right tibial fracture. Good alignment of fracture components is noted. Continued presence of mildly displaced fractures involving the proximal and distal fibular shafts. IMPRESSION:  Status post surgical internal fixation of  comminuted and displaced distal right tibial fracture. Continued presence of mildly displaced fractures involving proximal and distal fibular shafts. Electronically Signed   By: Marijo Conception M.D.   On: 04/26/2020 09:05    DG Ankle Right Port  Result Date: 04/26/2020 CLINICAL DATA:  Status post right foreleg ORIF EXAM: PORTABLE RIGHT ANKLE - 2 VIEW COMPARISON:  Findings are correlated with two view radiograph the right foreleg completed concurrently FINDINGS: Three view radiograph right ankle demonstrates a a comminuted distal tibial metadiaphyseal fracture status post ORIF utilizing a medial cortical plate and multiple compression screws. There is approximately 5 mm override, 1 cortical width posterior displacement, and minimal anterior angulation of the distal fracture fragment. There is persistent minimal widening of the a lateral aspect of the tibiotalar joint space involving the a tibial plafond and lateral mortise. No acute fracture or dislocation. Residual screw holes noted within the calcaneus. Vascular calcifications are noted. IMPRESSION: Right ankle ORIF as described above. Electronically Signed   By: Fidela Salisbury MD   On: 04/26/2020 09:13    VAS [name] LOWER EXTREMITY VENOUS (DVT)  Result Date: 04/26/2020  Lower Venous DVTStudy Indications: Fever, and Swelling.  Comparison Study: no prior Performing Technologist: Abram Sander RVS  Examination Guidelines: A complete evaluation includes B-mode imaging, spectral Doppler, color Doppler, and power Doppler as needed of all accessible portions of each vessel. Bilateral testing is considered an integral part of a complete examination. Limited examinations for reoccurring indications may be performed as noted. The reflux portion of the exam is performed with the patient in reverse Trendelenburg.  +---------+---------------+---------+-----------+----------+--------------+ RIGHT     CompressibilityPhasicitySpontaneityPropertiesThrombus Aging +---------+---------------+---------+-----------+----------+--------------+ CFV      Full           Yes      Yes                                 +---------+---------------+---------+-----------+----------+--------------+ SFJ      Full                                                        +---------+---------------+---------+-----------+----------+--------------+ FV Prox  Full                                                        +---------+---------------+---------+-----------+----------+--------------+ FV Mid   Full                                                        +---------+---------------+---------+-----------+----------+--------------+ FV DistalFull                                                        +---------+---------------+---------+-----------+----------+--------------+ PFV      Full                                                        +---------+---------------+---------+-----------+----------+--------------+  POP      Full           Yes      Yes                                 +---------+---------------+---------+-----------+----------+--------------+ PTV      Full                                                        +---------+---------------+---------+-----------+----------+--------------+ PERO                                                  Not visualized +---------+---------------+---------+-----------+----------+--------------+   +---------+---------------+---------+-----------+----------+--------------+ LEFT     CompressibilityPhasicitySpontaneityPropertiesThrombus Aging +---------+---------------+---------+-----------+----------+--------------+ CFV      Full           Yes      Yes                                 +---------+---------------+---------+-----------+----------+--------------+ SFJ      Full                                                         +---------+---------------+---------+-----------+----------+--------------+ FV Prox  Full                                                        +---------+---------------+---------+-----------+----------+--------------+ FV Mid   Full                                                        +---------+---------------+---------+-----------+----------+--------------+ FV Distal               Yes      Yes                                 +---------+---------------+---------+-----------+----------+--------------+ PFV      Full                                                        +---------+---------------+---------+-----------+----------+--------------+ POP      Full           Yes      Yes                                 +---------+---------------+---------+-----------+----------+--------------+  PTV      Full                                                        +---------+---------------+---------+-----------+----------+--------------+ PERO                                                  Not visualized +---------+---------------+---------+-----------+----------+--------------+     Summary: BILATERAL: - No evidence of deep vein thrombosis seen in the lower extremities, bilaterally. -No evidence of popliteal cyst, bilaterally.   *See table(s) above for measurements and observations. Electronically signed by Harold Barban MD on 04/26/2020 at 5:10:09 PM.    Final     Labs:  Basic Metabolic Panel: BMP Latest Ref Rng & Units 05/03/2020 04/26/2020 04/19/2020  Glucose 70 - 99 mg/dL 84 89 104(H)  BUN 6 - 20 mg/dL 16 18 12   Creatinine 0.44 - 1.00 mg/dL 0.60 0.79 0.68  Sodium 135 - 145 mmol/L 138 138 137  Potassium 3.5 - 5.1 mmol/L 4.3 4.4 4.5  Chloride 98 - 111 mmol/L 101 102 102  CO2 22 - 32 mmol/L 23 27 27   Calcium 8.9 - 10.3 mg/dL 8.8(L) 8.9 8.7(L)    CBC: CBC Latest Ref Rng & Units 05/03/2020 04/26/2020 04/19/2020  WBC 4.0 - 10.5 K/uL 3.8(L) 3.3(L)  4.0  Hemoglobin 12.0 - 15.0 g/dL 11.0(L) 10.6(L) 10.6(L)  Hematocrit 36 - 46 % 34.5(L) 33.2(L) 32.2(L)  Platelets 150 - 400 K/uL 212 213 204    CBG: No results for input(s): GLUCAP in the last 168 hours.  Brief HPI:   Imunique Samad is a 54 y.o. female with history of bipolar disorder, vaginal cancer, frequent UTIs, chronic back pain, morbid obesity who was admitted on 04/09/2020 after slipping on a puddle of water at her boyfriend's nursing facility.  She sustained acute comminuted fracture of distal tibial diaphysis with 1.4 cm displacement, multiple comminuted tibial fracture fragments and acute fracture of fibular diaphysis with 2 cm lateral and 0.5 cm anterior displacement, mildly displaced medial malleolus fracture and nondisplaced talar fracture.  She underwent I&D with external fixation of tib-fib fractures by Dr. Salli Quarry.  Dr. Doreatha Martin was consulted due to complexity of fracture and patient taken to the OR for ORIF right tibial shaft, I&D open right tibia fracture and closed treatment of right medial malleolus fracture on 06/21.  Postop to be NWB RLE with incisional VAC for about a week.  Therapy was initiated and patient was noted to have limitations due to nonweightbearing status on RLE, she required verbal visual and tactile cues to maintain these precautions, had difficulty following multistep commands and had issues with anxiety and pain affecting overall mobility and ADLs.  CIR was recommended to functional deficits.    Hospital Course: Deniesha Stenglein was admitted to rehab 04/15/2020 for inpatient therapies to consist of PT and OT at least three hours five days a week. Past admission physiatrist, therapy team and rehab RN have worked together to provide customized collaborative inpatient rehab.  BLE Dopplers were done to rule out DVTs and were negative.  She was maintained on subcu Lovenox for DVT prophylaxis.  She continued to have high levels of anxiety affecting her functional status  and  low-dose Xanax was added in p.m. to help with participation in therapy.  Wound VAC was removed without difficulty and dressing was changed to foam dressing with cam boot.  Follow-up labs showed electrolytes and renal status to be within normal limits.  CBC shows neutropenia is stable and acute blood loss anemia is resolving.  PVRs were monitored briefly due to reports of nocturia and frequency and showed no evidence of retention.  She was found to have ESBL E. coli/Klebsiella UTI on 06/30 and was started on Macrobid for treatment.   She continued to have low-grade fever therefore urine culture was rechecked on 7/06 and showed Klebsiella and Serratia UTI. She was treated with 2-week course of Macrobid and fosfomycin x1 dose.  She has defervesced with treatment.  Issues with constipation has resolved with diet modification.  Ortho has followed for input and recommends monitoring wound closely and keep contact them immediately for any signs of drainage.    Her pain has been reasonably controlled on current regimen.  Her blood pressures were monitored on TID basis and have been stable.  Dr. Sima Matas (neuropsychologist) has worked with patient on coping strategies to help manage her severe anxiety.  Chaplain was also consulted for spiritual care and social support.  Rehab team has provided regular reinforcement to ego support throughout the stay.  Her anxiety levels are improving however she continues to have difficulty maintaining nonweightbearing status and does not have any social supports for assistance after discharge.  SNF was recommended for follow-up therapy    Rehab course: During patient's stay in rehab weekly team conferences were held to monitor patient's progress, set goals and discuss barriers to discharge. At admission, patient required min assist +2 with total assist for placement of SB for transfers as was unable to maintain NWB on RLE. She required min to max assist with basic ADL tasks. She   has had improvement in activity tolerance, balance, postural control as well as ability to compensate for deficits.  She requires min assist with ADL tasks.  She requires max +2 assist for sit to stand transfers and is unable to maintain NWB RLE. She is able to complete AP transfers with set up assist of sliding board.    Disposition: [place].   Diet: Regular.   Special Instructions: 1. Cleanse incision with soap and water. Pat dry. May leave incision open to air.Needs to wear a clean sock for padding when in CAM boot.  Contact Dr. Doreatha Martin immediately if any drainage noted from incision. 2. Lovenox to continue till 05/11/20 for DVT prophylaxis.  3. CAM boot has to be on if up with therapy. Ok to remove for rest or gentle ankle ROM. 4. No weight on right leg.     Allergies as of 05/04/2020      Reactions   Ciprofloxacin Swelling   Lips swell, tongue swells, face swells   Citalopram Other (See Comments)   Possible cause of pancytopenia. Swelling of tongue, face and throat   Lamotrigine Other (See Comments)   Possible cause of pancytopenia. Swelling of face, throat and tongue   Sulfa Antibiotics Anaphylaxis   Tramadol Anaphylaxis, Shortness Of Breath, Swelling      Medication List    STOP taking these medications   docusate sodium 100 MG capsule Commonly known as: COLACE   HYDROmorphone 1 MG/ML injection Commonly known as: DILAUDID   methocarbamol 500 MG tablet Commonly known as: ROBAXIN   Vitamin D (Ergocalciferol) 1.25 MG (50000 UNIT) Caps  capsule Commonly known as: DRISDOL     TAKE these medications   acetaminophen 325 MG tablet Commonly known as: TYLENOL Take 1-2 tablets (325-650 mg total) by mouth every 4 (four) hours as needed for mild pain. What changed:   medication strength  how much to take  when to take this  reasons to take this   ALPRAZolam 0.25 MG tablet--Rx# 6 pills Commonly known as: XANAX Take 1 tablet (0.25 mg total) by mouth 2  (two) times daily as needed for anxiety.   enoxaparin 40 MG/0.4ML injection Commonly known as: LOVENOX Inject 0.4 mLs (40 mg total) into the skin daily.   gabapentin 100 MG capsule Commonly known as: NEURONTIN Take 1 capsule (100 mg total) by mouth 3 (three) times daily.   Iron 325 (65 Fe) MG Tabs Take 1 tablet by mouth every other day.   Latuda 20 MG Tabs tablet Generic drug: lurasidone Take 20 mg by mouth daily.   naproxen 250 MG tablet Commonly known as: NAPROSYN Take 1 tablet (250 mg total) by mouth 2 (two) times daily with a meal.   ondansetron 4 MG tablet Commonly known as: ZOFRAN Take 1 tablet (4 mg total) by mouth every 6 (six) hours as needed for nausea.   OXcarbazepine 150 MG tablet--Rx#10 pills Commonly known as: TRILEPTAL Take 0.5 tablets (75 mg total) by mouth in the morning and at bedtime.   oxybutynin 10 MG 24 hr tablet Commonly known as: DITROPAN-XL Take 10 mg by mouth daily.   Oxycodone HCl 10 MG Tabs--Rx#20 pills Take 1-1.5 tablets (10-15 mg total) by mouth 4 (four) times daily as needed. What changed:   when to take this  reasons to take this   saccharomyces boulardii 250 MG capsule Commonly known as: FLORASTOR Take 1 capsule (250 mg total) by mouth 2 (two) times daily.   Vitamin D3 25 MCG tablet Commonly known as: Vitamin D Take 1 tablet (1,000 Units total) by mouth daily. Start taking on: May 05, 2020       Follow-up Information    Haddix, Thomasene Lot, MD. Schedule an appointment as soon as possible for a visit in 2 week(s).   Specialty: Orthopedic Surgery Why: for repeat x-rays right tibia Contact information: [place] [address] [phone]        Meredith Staggers, MD. Call.   Specialty: Physical Medicine and Rehabilitation Why: as needed Contact information: [address] [place] [address] [phone]        [name], [place]. Call.   Specialty: Nurse Practitioner Why: for follow up  after discharge from SNF. Contact information: [place] [address] [phone]               Signed: Bary Leriche 05/04/2020, 12:25 PM

## 2020-05-04 ENCOUNTER — Inpatient Hospital Stay (HOSPITAL_COMMUNITY): Payer: Medicaid Other | Admitting: Occupational Therapy

## 2020-05-04 ENCOUNTER — Inpatient Hospital Stay (HOSPITAL_COMMUNITY): Payer: Medicaid Other | Admitting: Physical Therapy

## 2020-05-04 ENCOUNTER — Inpatient Hospital Stay (HOSPITAL_COMMUNITY): Payer: Medicaid Other | Admitting: *Deleted

## 2020-05-04 MED ORDER — OXCARBAZEPINE 150 MG PO TABS: 75.0000 mg | ORAL_TABLET | Freq: Two times a day (BID) | ORAL | 0 refills | Status: DC

## 2020-05-04 MED ORDER — ACETAMINOPHEN 325 MG PO TABS: 325.0000 mg | ORAL_TABLET | ORAL | Status: DC | PRN

## 2020-05-04 MED ORDER — OXYCODONE HCL 10 MG PO TABS: 10.0000 mg | ORAL_TABLET | Freq: Four times a day (QID) | ORAL | 0 refills | Status: DC | PRN

## 2020-05-04 MED ORDER — LATUDA 20 MG PO TABS: 20.0000 mg | ORAL_TABLET | Freq: Every day | ORAL | 0 refills | Status: DC

## 2020-05-04 MED ORDER — SACCHAROMYCES BOULARDII 250 MG PO CAPS: 250.0000 mg | ORAL_CAPSULE | Freq: Two times a day (BID) | ORAL | Status: DC

## 2020-05-04 MED ORDER — ALPRAZOLAM 0.25 MG PO TABS: 0.2500 mg | ORAL_TABLET | Freq: Two times a day (BID) | ORAL | 0 refills | Status: DC | PRN

## 2020-05-04 MED ORDER — VITAMIN D3 25 MCG PO TABS: 1000.0000 [IU] | ORAL_TABLET | Freq: Every day | ORAL | Status: DC

## 2020-05-04 NOTE — Plan of Care (Signed)
  Problem: RH Grooming Goal: LTG Patient will perform grooming w/assist,cues/equip (OT) Description: LTG: Patient will perform grooming with assist, with/without cues using equipment (OT) Outcome: Completed/Met   Problem: RH Bathing Goal: LTG Patient will bathe all body parts with assist levels (OT) Description: LTG: Patient will bathe all body parts with assist levels (OT) Outcome: Completed/Met   Problem: RH Dressing Goal: LTG Patient will perform upper body dressing (OT) Description: LTG Patient will perform upper body dressing with assist, with/without cues (OT). Outcome: Completed/Met Goal: LTG Patient will perform lower body dressing w/assist (OT) Description: LTG: Patient will perform lower body dressing with assist, with/without cues in positioning using equipment (OT) Outcome: Completed/Met   Problem: RH Toileting Goal: LTG Patient will perform toileting task (3/3 steps) with assistance level (OT) Description: LTG: Patient will perform toileting task (3/3 steps) with assistance level (OT)  Outcome: Completed/Met   Problem: RH Toilet Transfers Goal: LTG Patient will perform toilet transfers w/assist (OT) Description: LTG: Patient will perform toilet transfers with assist, with/without cues using equipment (OT) Outcome: Completed/Met   

## 2020-05-04 NOTE — Progress Notes (Signed)
Physical Therapy Session Note  Patient Details  Name: Jordan [name] MRN: [id number] Date of Birth: [birth year]  Today's Date: 05/04/2020 PT Individual Time: 1100-1200 PT Individual Time Calculation (min): 60 min   Short Term Goals: Week 3:  PT Short Term Goal 1 (Week 3): STG = LTG due to estimated d/c date.  Skilled Therapeutic Interventions/Progress Updates:    Pt received seated in bed, agreeable to PT session. No complaints of pain at rest, does report onset of R ankle pain during session and requests pain medication from RN at end of session. Pt is at mod I level for bed mobility with use of hospital bed features. Pt agreeable to attempt standing this date. Sit to stand with min A x 2 from EOB to RW with max cueing to adhere to [place] on RLE. Pt is able to perform sit to stand x 3 reps. Pt reports urge to urinate, is setup A for use of urinal at bed level, setup A for pericare, max A to don new brief following toileting. Pt agreeable to get up to w/c this session. A/P transfer into w/c with min A for RLE management. Once seated in w/c pt is at Supervision level for w/c mobility x 30 ft with one turn before onset of fatigue. Assisted pt back to her room via w/c. A/P transfer back to bed with setup A. Pt left seated in bed with needs in reach, bed alarm in place at end of session.  Therapy Documentation Precautions:  Precautions Precautions: Fall Precaution Comments: has anxiety with mobility Required Braces or Orthoses: Other Brace Splint/Cast: RLE Other Brace: CAM boot to R LE Restrictions Weight Bearing Restrictions: Yes RLE Weight Bearing: Non weight bearing    Therapy/Group: Individual Therapy   Excell Seltzer, PT, DPT  05/04/2020, 12:47 PM

## 2020-05-04 NOTE — Progress Notes (Signed)
Recreational Therapy Discharge Summary Patient Details  Name: Jordan [name] MRN: [id number] Date of Birth: [birth year] Today's Date: 05/04/2020  Long term goals set: 1  Long term goals met: 1  Comments on progress toward goals: Pt is discharging today to SNF for continued therapies and 24 hour supervision/assistance at Mod I level for simple TR tasks bed level or seated w/c level.  TR sessions focused activity analysis identifying potential modifications, stress management/relaxation training.   Reasons for discharge: discharge from hospital  Patient/family agrees with progress made and goals achieved: Yes  Jordan [name] 05/04/2020, 1:51 PM

## 2020-05-04 NOTE — Progress Notes (Signed)
Patient being discharged to [facility]. Patient is being transferred out by [facility]. Patient is alert and ready to go. Patient is aware of transfer and all instructions. Patient being transported out via stretcher.

## 2020-05-04 NOTE — Progress Notes (Signed)
Report called to [place], nurse at [facility]. Patient will be going to room [place]. Gave VS, NWB status to RLE due to FX. Advised of contact precautions due to ESBL with antibiotic completion. Advised patient took medications whole and used a slide board for transfers. Paschal had no other questions at this time.

## 2020-05-04 NOTE — Progress Notes (Signed)
Occupational Therapy Session Note  Patient Details  Name: Jordan [name] MRN: [id number] Date of Birth: 1965/11/21  Today's Date: 05/04/2020 OT Individual Time: 7342-8768 OT Individual Time Calculation (min): 58 min   Short Term Goals: Week 3:  OT Short Term Goal 1 (Week 3): Pt will complete 2 BSC transfers this week with 1 person assist OT Short Term Goal 2 (Week 3): Pt will complete 1 step of toileting task OT Short Term Goal 3 (Week 3): Pt will assist with donning 25% of CAM boot  Skilled Therapeutic Interventions/Progress Updates:    Pt greeted semi-reclined in bed and agreeable to OT treatment session focused on bathing and dressing. OT set pt up for sponge bath at bed level. Pt was able to complete full bath including washing hair using wash cap with only min A for thoroughness to was buttocks. Pt able to reach peri-area and wash feet in circle sitting. Set-up A to don bra and dress. OT Ace wrapped R LE for edema management. Set-up A for grooming tasks. OT discussed pt progress during rehabilitation stay and further discussed goals for rehab in next venue of care. Pt left semi-reclined in bed with bed alarm on and needs met.   Therapy Documentation Precautions:  Precautions Precautions: Fall Precaution Comments: has anxiety with mobility Required Braces or Orthoses: Other Brace Splint/Cast: RLE Other Brace: CAM boot to R LE Restrictions Weight Bearing Restrictions: Yes RLE Weight Bearing: Non weight bearing Pain: Pain Assessment Pain Scale: 0-10 Pain Score: 4  Pain Type: Acute pain Pain Location: Leg Pain Orientation: Right Pain Descriptors / Indicators: Aching Pain Intervention(s): Repositioned  Therapy/Group: Individual Therapy  Valma Cava 05/04/2020, 9:51 AM

## 2020-05-04 NOTE — Progress Notes (Signed)
Patient ID: Jordan [name], female   DOB: Jul 06, 1966, 54 y.o.   MRN: [id number]   SW confirmed with [facility] pt will be accepted. Pt will have private room due to contact precautions (ESBL). Pt will go to Room #209E [facility]; nurse report [phone]. [facility] ambulance pick up at 1:30pm.   SW met with pt in room to discuss above. SW informed medical team.   Loralee Pacas, MSW, [place] Office: [phone] Cell: [phone] Fax: [phone]

## 2020-05-04 NOTE — Progress Notes (Signed)
[place] PHYSICAL MEDICINE & REHABILITATION PROGRESS NOTE   Subjective/Complaints: Nervous about whether SNF will be able to accept her.   ROS: Patient denies fever, rash, sore throat, blurred vision, nausea, vomiting, diarrhea, cough, shortness of breath or chest pain, joint or back pain, headache, or mood change.    Objective:   No results found. Recent Labs    05/03/20 0707  WBC 3.8*  HGB 11.0*  HCT 34.5*  PLT 212   Recent Labs    05/03/20 0707  NA 138  K 4.3  CL 101  CO2 23  GLUCOSE 84  BUN 16  CREATININE 0.60  CALCIUM 8.8*    Intake/Output Summary (Last 24 hours) at 05/04/2020 1112 Last data filed at 05/04/2020 0700 Gross per 24 hour  Intake 1100 ml  Output 1525 ml  Net -425 ml     Physical Exam: Vital Signs Blood pressure (!) 115/54, pulse 87, temperature 97.6 F (36.4 C), temperature source Oral, resp. rate 14, height 5\' 8"  (1.727 m), weight (!) 147.8 kg, SpO2 100 %. Constitutional: No distress . Vital signs reviewed. HEENT: EOMI, oral membranes moist Neck: supple Cardiovascular: RRR without murmur. No JVD    Respiratory/Chest: CTA Bilaterally without wheezes or rales. Normal effort    GI/Abdomen: BS +, non-tender, non-distended Ext: no clubbing, cyanosis, tr RLE edema Psych: pleasant but anxious Musc: Right distal lower extremity edema much improved Neuro:  Alert Motor: Bilateral upper extremities: 5/5 proximal distal Left lower extremity: 5/5 proximal distal Right lower extremity: Limited by pain proximally, ankle dorsiflexion 4/5, unchanged Skin: all wounds RLE clean and dry, foam dressing on ONE wound only  Assessment/Plan: 1. Functional deficits secondary to RLE tib/fib fx s/p ex-fix and ORIF_ NWB on RLE which require 3+ hours per day of interdisciplinary therapy in a comprehensive inpatient rehab setting.  Physiatrist is providing close team supervision and 24 hour management of active medical problems listed below.  Physiatrist and rehab  team continue to assess barriers to discharge/monitor patient progress toward functional and medical goals  Care Tool:  Bathing  Bathing activity did not occur:  (N/A) Body parts bathed by patient: Right arm, Left arm, Chest, Abdomen, Right upper leg, Left upper leg, Face, Left lower leg, Right lower leg, Front perineal area   Body parts bathed by helper: Buttocks Body parts n/a: Buttocks   Bathing assist Assist Level: Contact Guard/Touching assist     Upper Body Dressing/Undressing Upper body dressing   What is the patient wearing?: Dress, Bra    Upper body assist Assist Level: Set up assist    Lower Body Dressing/Undressing Lower body dressing      What is the patient wearing?: Underwear/pull up     Lower body assist Assist for lower body dressing: Minimal Assistance - Patient > 75%     Toileting Toileting Toileting Activity did not occur (Clothing management and hygiene only): N/A (no void or bm)  Toileting assist Assist for toileting: Minimal Assistance - Patient > 75%     Transfers Chair/bed transfer  Transfers assist  Chair/bed transfer activity did not occur: Safety/medical concerns (unable to maintain R LE NWB)  Chair/bed transfer assist level:  (A/P transfer) Chair/bed transfer assistive device: Armrests   Locomotion Ambulation   Ambulation assist   Ambulation activity did not occur: Safety/medical concerns (unable to maintain R LE NWB in standing)          Walk 10 feet activity   Assist  Walk 10 feet activity did not occur: Safety/medical concerns  Walk 50 feet activity   Assist Walk 50 feet with 2 turns activity did not occur: Safety/medical concerns         Walk 150 feet activity   Assist Walk 150 feet activity did not occur: Safety/medical concerns         Walk 10 feet on uneven surface  activity   Assist Walk 10 feet on uneven surfaces activity did not occur: Safety/medical concerns          Wheelchair     Assist Will patient use wheelchair at discharge?: Yes Type of Wheelchair: Manual    Wheelchair assist level: Supervision/Verbal cueing Max wheelchair distance: 20 ft    Wheelchair 50 feet with 2 turns activity    Assist    Wheelchair 50 feet with 2 turns activity did not occur:  (decreased activity tolerance)   Assist Level: Maximal Assistance - Patient 25 - 49%   Wheelchair 150 feet activity     Assist  Wheelchair 150 feet activity did not occur:  (decreased activity tolerance)   Assist Level: Total Assistance - Patient < 25%   Blood pressure (!) 115/54, pulse 87, temperature 97.6 F (36.4 C), temperature source Oral, resp. rate 14, height 5\' 8"  (1.727 m), weight (!) 147.8 kg, SpO2 100 %.  Medical Problem List and Plan: 1.  Impaired function secondary to RLE tib/fib comminuted fxs after fall-ORIF 6/21 by Dr. Doreatha Martin is NWB on RLE  DC to SNF today  -needs ortho follow up after discharge  -does NOT need to see [facility]  2.  Antithrombotics: -DVT/anticoagulation:  Pharmaceutical: Lovenox  -dopplers clear             -antiplatelet therapy: N/A 3. Pain Management: prn oxycodone   -gabapentin 100mg  tid  -ice/heat, edmea control, reassurance  Controlled at rest  4. Mood: LCSW to follow for evaluation and support.              -antipsychotic agents: On Latuda  -evaluation pending with Dr. Sima Matas re: anxiety,? PTSD in setting of her baseline bipolar disorder   -continue trileptal and latuda per baseline   -needs regular reinforcement and egosupport 5. Neuropsych: This patient is capable of making decisions on her own behalf. 6. Skin/Wound Care: Routine pressure relief measures.    -ACE wrap and elevation for edema control.  7. Fluids/Electrolytes/Nutrition: encourage po.  I personally reviewed the patient's labs today.   9. H/o Nocturia/frequent UTIs: On ditropan.   -oob to void  -urine cx + 100K E Coli (ESBL), klebsiella   -contact  precautions   -sensitive to Macrobid --->7 day rx, continent of urine       -ucx 100k Klebsiella and now serratia    -7 days Macrobid     - fosfomycin x 1 dose    -sx improved 10. Constipation:    Improved 11.  Vitamin D deficiency  Supplement 12.  Leukopenia  WBC 3.3 on 7/14,   13.  Acute blood loss anemia  Hemoglobin 11 7/14,   LOS: 19 days A FACE TO [place] 05/04/2020, 11:12 AM

## 2020-05-04 NOTE — Progress Notes (Signed)
Recreational Therapy Session Note  Patient Details  Name: Jordan [name] MRN: [id number] Date of Birth: March 21, 1966 Today's Date: 05/04/2020  Pain: no c/o Skilled Therapeutic Interventions/Progress Updates: Session focused on pt education including use of leisure time, activity modifications, importance of staying active, self advocacy, & relaxation strategies at SNF.    Encouraged pt to be involved in activities program offered by the facility as able.  Pt states she feels prepared for discharge.  Pt is Mod I with activities previously provided for in room use.  Therapy/Group: Individual Therapy  Curly Mackowski 05/04/2020, 1:45 PM

## 2020-05-05 ENCOUNTER — Telehealth: Payer: Self-pay | Admitting: Oncology

## 2020-05-05 NOTE — Telephone Encounter (Signed)
Jordan [name] called and said she has been discharged to a nursing home.  She is wondering if the Harrie Foreman, in the hospital can call her.  Advised her that I will send the request to the Chaplain in the cancer center to see if she can contact Dorian Pod.

## 2020-05-09 ENCOUNTER — Encounter: Payer: Self-pay | Admitting: General Practice

## 2020-05-09 NOTE — Progress Notes (Signed)
[place] Spiritual Care Note  Referred by Santiago Glad Hess/RN for spiritual/emotional support and encouragement related to Jordan [name]'s rehab following the broken leg. Jordan [name] reports good support and wisdom from her landlady, and that she herself is working to take her recovery one step at a time. A big success is working away from her earlier fear of falling as she practices standing. She reports being very motivated to walk again. We plan to follow up by phone in early August.   Chaplain [name], [place], [facility] Pager [phone] Voicemail [phone]

## 2020-05-23 ENCOUNTER — Encounter: Payer: Self-pay | Admitting: General Practice

## 2020-05-23 NOTE — Progress Notes (Signed)
[place] Spiritual Care Note  Reached Jordan [name] by phone for a brief follow-up call. She was delighted to share that she's gotten a positive ortho/PT update about her leg's healing process and not needing the wheelchair anymore. She plans to phone back at a more convenient time.   [place], [place], [facility] Pager [phone] Voicemail [phone]

## 2020-05-29 ENCOUNTER — Ambulatory Visit: Payer: Medicaid Other | Admitting: Radiation Oncology

## 2020-06-02 ENCOUNTER — Telehealth: Payer: Self-pay | Admitting: Oncology

## 2020-06-02 NOTE — Telephone Encounter (Signed)
Jordan [name] called and said she is going home today from the nursing home.  She just wanted to let us know.

## 2020-06-05 ENCOUNTER — Other Ambulatory Visit: Payer: Self-pay | Admitting: Nurse Practitioner

## 2020-06-05 DIAGNOSIS — Z1231 Encounter for screening mammogram for malignant neoplasm of breast: Secondary | ICD-10-CM

## 2020-06-06 ENCOUNTER — Encounter: Payer: Self-pay | Admitting: General Practice

## 2020-06-06 NOTE — Progress Notes (Signed)
[place] Spiritual Care Note  Received Shia's request for a chaplain call via Santiago Glad Hess/RN. Reached Jordan [name] by phone, celebrating with her that she has returned home from the nursing home and is able to be independent in her own space. Provided pastoral listening and reflection as she shared and processed recent frustrations and strong feelings. Normalized feelings, affirmed strengths, helped with refocusing her energy.   [place], [place], [facility] Pager [phone] Voicemail [phone]

## 2020-06-07 ENCOUNTER — Telehealth: Payer: Self-pay | Admitting: Oncology

## 2020-06-07 NOTE — Telephone Encounter (Signed)
Telia called and asked if there is a Social worker available now through the [employer]  Advised her that we will have a Barrister's clerk in 2 weeks and we have added her to the list for the counselor to call her as soon as she is available.

## 2020-06-12 ENCOUNTER — Telehealth: Payer: Self-pay | Admitting: Oncology

## 2020-06-12 NOTE — Telephone Encounter (Signed)
Jordan [name] called very upset that she got a call out of the blue that her youngest brother has liver disease and is in hospice.  She would like to talk to [name], [place].

## 2020-06-13 ENCOUNTER — Encounter: Payer: Self-pay | Admitting: General Practice

## 2020-06-13 NOTE — Progress Notes (Signed)
[place] Note  Following Amahia for emotional support particularly related to distress at her brother's illness and her family's lack of recognition of her own. Provided empathic, reflective listening and affirmation of strengths by phone and plan to follow up by phone in the next week per her request.   Suzette Battiest, [place], [facility] Pager [phone] Voicemail [phone]

## 2020-06-16 ENCOUNTER — Encounter: Payer: Self-pay | Admitting: General Practice

## 2020-06-16 NOTE — Patient Instructions (Signed)
[place] Spiritual Care Note  Attempted to reach Jordan [name] to follow up by phone per her request, but left voicemail encouraging callback.   [place], [place], [facility] Pager [phone] Voicemail [phone]

## 2020-06-19 ENCOUNTER — Encounter: Payer: Self-pay | Admitting: Radiation Oncology

## 2020-06-19 ENCOUNTER — Other Ambulatory Visit: Payer: Self-pay

## 2020-06-19 ENCOUNTER — Ambulatory Visit
Admission: RE | Admit: 2020-06-19 | Discharge: 2020-06-19 | Disposition: A | Discharge status: Home / Self Care | Payer: Medicaid Other | Source: Ambulatory Visit | Attending: Radiation Oncology | Admitting: Radiation Oncology

## 2020-06-19 ENCOUNTER — Telehealth: Payer: Self-pay | Admitting: *Deleted

## 2020-06-19 VITALS — BP 113/82 | HR 83 | Temp 98.4°F | Resp 18 | Ht 68.0 in

## 2020-06-19 DIAGNOSIS — Z7901 Long term (current) use of anticoagulants: Secondary | ICD-10-CM | POA: Diagnosis not present

## 2020-06-19 DIAGNOSIS — Z79899 Other long term (current) drug therapy: Secondary | ICD-10-CM | POA: Diagnosis not present

## 2020-06-19 DIAGNOSIS — C52 Malignant neoplasm of vagina: Secondary | ICD-10-CM

## 2020-06-19 DIAGNOSIS — Z8589 Personal history of malignant neoplasm of other organs and systems: Secondary | ICD-10-CM | POA: Diagnosis not present

## 2020-06-19 NOTE — Progress Notes (Signed)
Patient his here for a f/u visit with Dr. Sondra Come.  Patient denies pain, bleeding or bowel/bladder problems. Pt. fx her right foot in June.  BP 113/82 (BP Location: Left Arm, Patient Position: Sitting)   Pulse 83   Temp 98.4 F (36.9 C) (Oral)   Resp 18   Ht 5\' 8"  (1.727 m)   LMP  (LMP Unknown)   SpO2 98%   BMI 49.54 kg/m    Wt Readings from Last 3 Encounters:  05/03/20 (!) 325 lb 13.4 oz (147.8 kg)  04/09/20 (!) 317 lb 3.9 oz (143.9 kg)  01/24/20 (!) 317 lb 3.2 oz (143.9 kg)

## 2020-06-19 NOTE — Telephone Encounter (Signed)
Called patient to inform of fu appt. with [name] on 09-25-20 - arrival time- 12:30 pm, spoke with patient and she is aware of this appt.

## 2020-06-19 NOTE — Telephone Encounter (Signed)
Returned [name]'s call in rad one and moved patient's appt from October to December. Enid Derry will contact the patient

## 2020-06-19 NOTE — Progress Notes (Signed)
Radiation Oncology         [phone] ________________________________  Name: Jordan [name] MRN: [id number]  Date: 06/19/2020  DOB: 08/13/1966  Follow-Up Visit Note  CC: [name], NP  Everitt Amber, MD    ICD-10-CM   1. 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  Radiation Treatment Dates: 08/24/2019 through 09/22/2019 Site Technique Total Dose (Gy) Dose per Fx (Gy) Completed Fx Beam Energies  Pelvis: Pelvis_vagina HDR-brachy 30/30 6 5/5 Ir-192    Narrative:  The patient returns today routine follow-up. Since her last visit, she was seen by [name] on 01/24/2020. At that time, it was recommended that she remain on surveillance with follow-up in three-month intervals. She is to get a PAP smear in October.  Of note, the patient was seen in the ED on 04/09/2020 for open fracture of the distal end of the right tibia status post fall. She was admitted to the hospital and underwent a right tibia and fibula external fixation with right open fracture incision and debridement later that day (Dr. Griffin Basil). The following day, she underwent an open reduction internal fixation of right tibial shaft, irrigation and debridement of right open tibia fracture, closed treatment of right medial malleolus fracture, removal of external fixator right lower leg, and incisional wound vac placement by Dr. Doreatha Martin. The patient was stabilized and discharged to physical rehabilitation center on 04/15/2020 for inpatient PT and OT at least there hours a day for five days a week. After completing rehab, she was transferred to a skilled nursing facility on 05/04/2020 and was then discharged home from there on 06/02/2020.  On review of systems, she reports no complaints. She denies pelvic pain, vaginal bleeding, and changes in bowel/bladder  patterns.  She is ambulating at home with the assistance of a walker  ALLERGIES:  is allergic to ciprofloxacin, citalopram, lamotrigine, sulfa antibiotics, and tramadol.  Meds: Current Outpatient Medications  Medication Sig Dispense Refill  . acetaminophen (TYLENOL) 325 MG tablet Take 1-2 tablets (325-650 mg total) by mouth every 4 (four) hours as needed for mild pain.    Marland Kitchen ALPRAZolam (XANAX) 0.25 MG tablet Take 1 tablet (0.25 mg total) by mouth 2 (two) times daily as needed for anxiety. 6 tablet 0  . cholecalciferol (VITAMIN D) 25 MCG tablet Take 1 tablet (1,000 Units total) by mouth daily.    Marland Kitchen enoxaparin (LOVENOX) 40 MG/0.4ML injection Inject 0.4 mLs (40 mg total) into the skin daily. 0 mL   . Ferrous Sulfate (IRON) 325 (65 Fe) MG TABS Take 1 tablet by mouth every other day.    . gabapentin (NEURONTIN) 100 MG capsule Take 1 capsule (100 mg total) by mouth 3 (three) times daily.    Marland Kitchen lurasidone (LATUDA) 20 MG TABS tablet Take 1 tablet (20 mg total) by mouth daily. 5 tablet 0  . naproxen (NAPROSYN) 250 MG tablet Take 1 tablet (250 mg total) by mouth 2 (two) times daily with a meal.    . ondansetron (ZOFRAN) 4 MG tablet Take 1 tablet (4 mg total) by mouth every 6 (six) hours as needed for nausea. 20 tablet 0  . OXcarbazepine (TRILEPTAL) 150 MG tablet Take 0.5 tablets (75 mg total) by mouth in the morning and at bedtime. 10 tablet 0  . oxybutynin (DITROPAN-XL) 10 MG 24 hr  tablet Take 10 mg by mouth daily.    . Oxycodone HCl 10 MG TABS Take 1-1.5 tablets (10-15 mg total) by mouth 4 (four) times daily as needed. 20 tablet 0  . saccharomyces boulardii (FLORASTOR) 250 MG capsule Take 1 capsule (250 mg total) by mouth 2 (two) times daily.     No current facility-administered medications for this encounter.    Physical Findings: The patient is in no acute distress. Patient is alert and oriented.  height is 5\' 8"  (1.727 m). Her oral temperature is 98.4 F (36.9 C). Her blood pressure is 113/82 and  her pulse is 83. Her respiration is 18 and oxygen saturation is 98%. .  Lungs are clear to auscultation bilaterally. Heart has regular rate and rhythm. No palpable cervical, supraclavicular, or axillary adenopathy. Abdomen soft, non-tender, normal bowel sounds.  On pelvic examination the external genitalia were unremarkable. A speculum exam was performed. There are no mucosal lesions noted in the vaginal vault. On bimanual  examination there were no pelvic masses appreciated.   Lab Findings: Lab Results  Component Value Date   WBC 3.8 (L) 05/03/2020   HGB 11.0 (L) 05/03/2020   HCT 34.5 (L) 05/03/2020   MCV 99.7 05/03/2020   PLT 212 05/03/2020    Radiographic Findings: No results found.  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  Patient is not been using her vaginal dilator since admission to the hospital and nursing home.  She in addition is unsure where her equipment is located so we have given her additional dilators to use and discussed the importance of this part of her routine follow-up.  Plan: Patient will follow-up with [name] 3 months with pap smear planned and follow-up with radiation oncology in six months.  Total time spent in this encounter was 25 minutes which included reviewing the patient's most recent x-rays, ED visit, hospitalization, rehabilitation, physical examination, and documentation.  ____________________________________   Blair Promise, PhD, MD  This document serves as a record of services personally performed by Gery Pray, MD. It was created on his behalf by Clerance Lav, a trained medical scribe. The creation of this record is based on the scribe's personal observations and the provider's statements to them. This document has been checked and approved by the attending provider.

## 2020-06-28 ENCOUNTER — Ambulatory Visit: Payer: Medicaid Other

## 2020-06-29 ENCOUNTER — Ambulatory Visit: Payer: Self-pay | Admitting: *Deleted

## 2020-06-29 NOTE — Telephone Encounter (Signed)
Pt reports diarrhea x 2 days, 2 episodes yesterday, 1 today. States loose, not watery. Denies any abdominal pain, no fever. States is able to stay hydrated, urine light yellow. No nausea or vomiting. HAs been taking Pepto Bismol and Imodium. Advised covid testing, pt states will call PCP. Home care advise given, pt verbalizes understanding.   Reason for Disposition . MILD-MODERATE diarrhea (e.g., 1-6 times / day more than normal)  Answer Assessment - Initial Assessment Questions 1. DIARRHEA SEVERITY: "How bad is the diarrhea?" "How many extra stools have you had in the past 24 hours than normal?"    - NO DIARRHEA (SCALE 0)   - MILD (SCALE 1-3): Few loose or mushy BMs; increase of 1-3 stools over normal daily number of stools; mild increase in ostomy output.   -  MODERATE (SCALE 4-7): Increase of 4-6 stools daily over normal; moderate increase in ostomy output. * SEVERE (SCALE 8-10; OR 'WORST POSSIBLE'): Increase of 7 or more stools daily over normal; moderate increase in ostomy output; incontinence.    1 time today, yesterday 2 ties 2. ONSET: "When did the diarrhea begin?"      Yesterday 3. BM CONSISTENCY: "How loose or watery is the diarrhea?"      loose 4. VOMITING: "Are you also vomiting?" If Yes, ask: "How many times in the past 24 hours?"      no 5. ABDOMINAL PAIN: "Are you having any abdominal pain?" If Yes, ask: "What does it feel like?" (e.g., crampy, dull, intermittent, constant)      no 6. ABDOMINAL PAIN SEVERITY: If present, ask: "How bad is the pain?"  (e.g., Scale 1-10; mild, moderate, or severe)   - MILD (1-3): doesn't interfere with normal activities, abdomen soft and not tender to touch    - MODERATE (4-7): interferes with normal activities or awakens from sleep, tender to touch    - SEVERE (8-10): excruciating pain, doubled over, unable to do any normal activities       *NA 7. ORAL INTAKE: If vomiting, "Have you been able to drink liquids?" "How much fluids have you had in  the past 24 hours?"     *NA 8. HYDRATION: "Any signs of dehydration?" (e.g., dry mouth [not just dry lips], too weak to stand, dizziness, new weight loss) "When did you last urinate?"     no 10. ANTIBIOTIC USE: "Are you taking antibiotics now or have you taken antibiotics in the past 2 months?"       no 11. OTHER SYMPTOMS: "Do you have any other symptoms?" (e.g., fever, blood in stool)       no  Protocols used: DIARRHEA-A-AH

## 2020-07-04 ENCOUNTER — Telehealth: Payer: Self-pay | Admitting: *Deleted

## 2020-07-04 ENCOUNTER — Encounter: Payer: Self-pay | Admitting: General Practice

## 2020-07-04 ENCOUNTER — Ambulatory Visit: Payer: Medicaid Other

## 2020-07-04 NOTE — Telephone Encounter (Signed)
Patient called the office to check on her appt with Dr Denman George. Patient stated "Dr Clabe Seal office set up the appt with Dr Denman George. The card they gave me says 07/26/2020 at 1 pm. Is this the right appt." Explained that someone wrote down the wrong appt and the correct appt is 09/25/2020 at 1 pm. Moved the appt to 09/11/2020 at 2 pm. Patient wrote down the appt and request a calendar mailed to her home. Calendar mailed to her home. Patient also requested a reminded message closer to her appt. Explained that the office would mail another calendar the beginning of November.

## 2020-07-04 NOTE — Progress Notes (Signed)
[place] Spiritual Care Note  Received phone call from [place], who was doublechecking that she is on the new [employer] Counseling Intern's list. Confirmed that she is, advised about anticipated timing, and provided opportunity for her to share about her self-differentiation and boundary-setting work within her family system. Also affirmed that [place] remains available to her alongside counseling.   [place], [place], [facility] Pager [phone] Voicemail [phone]

## 2020-07-06 ENCOUNTER — Ambulatory Visit: Payer: Medicaid Other | Admitting: Physical Therapy

## 2020-07-10 ENCOUNTER — Telehealth: Payer: Self-pay

## 2020-07-10 NOTE — Telephone Encounter (Signed)
Scheduled counseling intake session for 9/24 at 12 pm. Patient requested a physical reminder be mailed to her for Medicaid purposes.

## 2020-07-11 ENCOUNTER — Ambulatory Visit: Payer: Medicaid Other | Admitting: Physical Therapy

## 2020-07-13 ENCOUNTER — Telehealth: Payer: Self-pay | Admitting: *Deleted

## 2020-07-13 NOTE — Telephone Encounter (Signed)
Spoke with Threasa Beards AD and patient is ok to come her appt tomorrow.

## 2020-07-13 NOTE — Telephone Encounter (Signed)
Patient called and stated " I have a sore throat, but no fever, cough or running nose. My doctor told me it was allergies. I have a counseling appt tomorrow. Can I still come?" Explained that I would find out and call her back

## 2020-07-14 NOTE — Progress Notes (Signed)
[place] Patient and [facility] Counseling Note    Patient was 15 minutes late to the session, but dived right into sharing information about her current frustrations. While client said "mental problems" were her main concern, she mostly shared about her recent challenges with family. Her feelings and situation regarding said family were the main topic of conversation. After this, the PDS and intake were done together. Patient was visibly upset and projected loudly her frustrations. Criss's perceived betrayal from her family has caused of lot of hurt and anger, which seems to be a current cause of distress. Other factors could be at play. Future sessions will explore the challenges Adison currently has and grow the therapeutic relationship.       [name] Counseling Intern

## 2020-07-17 ENCOUNTER — Encounter: Payer: Self-pay | Admitting: General Practice

## 2020-07-17 NOTE — Progress Notes (Signed)
[place] Spiritual Care Note  Received call from [place] to process recent family stressors. Provided pastoral presence, empathic listening, emotional support, and affirmation of strengths. Jordan [name] plans to call chaplain again as needed.   [place], [place], [facility] Pager [phone] Voicemail [phone]

## 2020-07-18 ENCOUNTER — Ambulatory Visit: Payer: Medicaid Other | Admitting: Physical Therapy

## 2020-07-21 ENCOUNTER — Encounter: Payer: Self-pay | Admitting: General Practice

## 2020-07-21 NOTE — Progress Notes (Signed)
[place] Spiritual Care Note  Received and returned voicemail from [place]. Reminded her of interim chaplain availability for support in October, while I am out on leave.   [place], [place], [facility] Pager [phone] Voicemail [phone]

## 2020-07-25 ENCOUNTER — Other Ambulatory Visit: Payer: Self-pay

## 2020-07-25 ENCOUNTER — Encounter: Payer: Self-pay | Admitting: Physical Therapy

## 2020-07-25 ENCOUNTER — Ambulatory Visit: Payer: Medicaid Other | Attending: Student | Admitting: Physical Therapy

## 2020-07-25 DIAGNOSIS — R600 Localized edema: Secondary | ICD-10-CM | POA: Diagnosis present

## 2020-07-25 DIAGNOSIS — M25571 Pain in right ankle and joints of right foot: Secondary | ICD-10-CM | POA: Diagnosis present

## 2020-07-25 DIAGNOSIS — Z8781 Personal history of (healed) traumatic fracture: Secondary | ICD-10-CM | POA: Insufficient documentation

## 2020-07-25 DIAGNOSIS — R262 Difficulty in walking, not elsewhere classified: Secondary | ICD-10-CM | POA: Insufficient documentation

## 2020-07-25 DIAGNOSIS — Z9889 Other specified postprocedural states: Secondary | ICD-10-CM | POA: Insufficient documentation

## 2020-07-25 NOTE — Addendum Note (Signed)
Addended by: Jonna Coup on: 07/25/2020 04:23 PM   Modules accepted: Orders

## 2020-07-25 NOTE — Therapy (Addendum)
[facility], [place], [address] Phone: [phone]   Fax:  [phone]  Physical Therapy Evaluation  Patient Details  Name: Jordan [name] MRN: [id number] Date of Birth: 1966-07-08 Referring Provider (PT): Delray Alt, PA-C (surgeon Katha Hamming, MD)   Encounter Date: 07/25/2020   PT End of Session - 07/25/20 1127    Visit Number 1    Date for PT Re-Evaluation 08/26/20    Authorization Type MCD- submitted 10/5    PT Start Time 1123    PT Stop Time 1203    PT Time Calculation (min) 40 min    Activity Tolerance Patient tolerated treatment well    Behavior During Therapy [facility] for tasks assessed/performed           Past Medical History:  Diagnosis Date  . Alcohol abuse   . Anemia    patient denies  . Bipolar 1 disorder (HCC)    No medications currently  . CAP (community acquired pneumonia) 03/17/2015  . 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 Mental disorder   . Obesity   . PICC line infection 05/17/2015  . Sepsis due to Gram negative bacteria (MDR E Coli) 02/18/2015  . 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 UTI (lower urinary tract infection)   . Vaginal Pap smear, abnormal     Past Surgical History:  Procedure Laterality Date  . ABDOMINAL HYSTERECTOMY    . CERVICAL CONIZATION W/BX N/A 07/01/2016   Procedure: CONIZATION CERVIX WITH BIOPSY;  Surgeon: Chancy Milroy, MD;  Location: [facility];  Service: Gynecology;  Laterality: N/A;  . CHOLECYSTECTOMY    . EXTERNAL FIXATION LEG Right 04/09/2020   Procedure: EXTERNAL FIXATION LEG;  Surgeon: Hiram Gash, MD;  Location: [facility];  Service: Orthopedics;  Laterality: Right;  . HYSTEROSCOPY WITH D & C N/A 01/25/2016   Procedure: DILATATION AND CURETTAGE /HYSTEROSCOPY;  Surgeon: Mora Bellman, MD;  Location: [facility];  Service: Gynecology;  Laterality: N/A;  . LYMPH NODE BIOPSY Bilateral 07/22/2019   Procedure: LYMPH NODE BIOPSY;   Surgeon: Everitt Amber, MD;  Location: [facility];  Service: Gynecology;  Laterality: Bilateral;  . OPEN REDUCTION INTERNAL FIXATION (ORIF) TIBIA/FIBULA FRACTURE Right 04/10/2020   Procedure: OPEN REDUCTION INTERNAL FIXATION (ORIF) TIBIA/FIBULA FRACTURE;  Surgeon: Shona Needles, MD;  Location: [facility];  Service: Orthopedics;  Laterality: Right;  . ROBOT ASSISTED MYOMECTOMY N/A 07/22/2019   Procedure: XI ROBOTIC ASSISTED LAPAROSCOPIC RADICAL UPPER VAGINECTOMY, LEFT SALPINECTOMY, RIGHT SALPINGOOOPHERECTOMY;  Surgeon: Everitt Amber, MD;  Location: [facility];  Service: Gynecology;  Laterality: N/A;  . VAGINAL HYSTERECTOMY N/A 03/11/2017   Procedure: HYSTERECTOMY VAGINAL WITH MORCELLATION;  Surgeon: Chancy Milroy, MD;  Location: [facility];  Service: Gynecology;  Laterality: N/A;    There were no vitals filed for this visit.    Subjective Assessment - 07/25/20 1133    Subjective Slipped and fell resulting in fracture and need for ORIF. Not wearing boot at home or using rollator. I will sit in rollator when my foot really hurts. My boyfriend lives in a nursing home and that is where I slipped but I cannot visit him right now due to pandemic.    How long can you walk comfortably? 10 min or so out of boot    Patient Stated Goals decrease pain, get out boot, walk without AD (PLOF)    Currently in Pain? Yes    Pain Score 3     Pain  Location Foot    Pain Orientation Right    Pain Descriptors / Indicators Aching;Sharp    Aggravating Factors  walking    Pain Relieving Factors rest, pain meds              [facility] PT Assessment - 07/25/20 0001      Assessment   Medical Diagnosis s/p Rt tib-fib ORIF    Referring Provider (PT) Delray Alt, PA-C   surgeon Katha Hamming, MD   Onset Date/Surgical Date 04/10/20    Hand Dominance Left    Prior Therapy only in hospital and nursing home      Precautions   Precautions None      Restrictions   Other Position/Activity Restrictions WBAT      Balance Screen   Has  the patient fallen in the past 6 months Yes    How many times? 1    Has the patient had a decrease in activity level because of a fear of falling?  Yes    Is the patient reluctant to leave their home because of a fear of falling?  No      Home Environment   Living Environment Private residence    Living Arrangements Alone    Additional Comments stairs in and out of home but use back door where there is just a big hump      Prior Function   Level of Independence Independent    Vocation On disability      Cognition   Overall Cognitive Status No family/caregiver present to determine baseline cognitive functioning      Observation/Other Assessments   Focus on Therapeutic Outcomes (FOTO)  n/a MCD      Observation/Other Assessments-Edema    Edema Circumferential      Circumferential Edema   Circumferential - Right 25    Circumferential - Left  22.5      Sensation   Additional Comments WFL      ROM / Strength   AROM / PROM / Strength AROM      AROM   Overall AROM Comments measured compared to "0"= 90 deg at rest    AROM Assessment Site Ankle    Right/Left Ankle Right    Right Ankle Dorsiflexion -2    Right Ankle Plantar Flexion 60      Ambulation/Gait   Gait Comments in boot with rollator, antalgic      High Level Balance   High Level Balance Comments NT at eval- new out of boot today                      Objective measurements completed on examination: See above findings.       [facility] PT Treatment/Exercise - 07/25/20 0001      Exercises   Exercises Ankle      Ankle Exercises: Stretches   Gastroc Stretch Limitations long sitting with strap      Ankle Exercises: Seated   Other Seated Ankle Exercises long sitting: PD/DF, INV/EVER                  PT Education - 07/25/20 1554    Education Details anatomy of condition, POC, HEP, exercise form/rationale    Person(s) Educated Patient    Methods Explanation;Demonstration;Tactile  cues;Verbal cues;Handout    Comprehension Verbalized understanding;Returned demonstration;Verbal cues required;Tactile cues required;Need further instruction            PT Short Term Goals - 07/25/20 1246  PT SHORT TERM GOAL #1   Title ankle DF to 5 deg    Baseline -2 at eval    Time 4    Period Weeks    Status New    Target Date 08/25/20      PT SHORT TERM GOAL #2   Title pt will demo full weight acceptance into Rt foot in static stance    Baseline partial weight bearing with need for UE assist on rollator    Time 4    Period Weeks    Status New    Target Date 08/25/20             PT Long Term Goals - 07/25/20 1248      PT LONG TERM GOAL #1   Title pt will be able to ambulate safely around the clinic without AD    Baseline full time use of rollator at eval    Time --   date TBD based off of authorization at re-eval     PT LONG TERM GOAL #2   Title pain with ADLs <=2/10    Baseline 3/10 at rest using boot and AD at eval      PT LONG TERM GOAL #3   Title pt will ambulate in the community using LRAD    Baseline rollator at eval, PLOF was without AD, will determine need for AD based on balance and safety      PT LONG TERM GOAL #4   Title pt will demo SLS comfortably for at least 5s without UE assist to demo full weight acceptance as well as balance control for safety    Baseline unable at eval                  Plan - 07/25/20 1237    Clinical Impression Statement Pt presents to PT with complaints of limited functional ambulation and right foot pain following ORIF in June. She has cancelled her evaluation 3 times and is just now presenting for her initial eval. She is still wearing her boot which I instructed her to move out of per referral. She was happy about no longer wearing her boot and we discussed the importance of ice and rest when she feels increased pain. Pt has significant edema in Rt lower leg compared to left. No s/s of infection and incision  sites are healing well. Pt will benefit from skilled PT to reach functional goals and return to PLOF.    Personal Factors and Comorbidities Fitness;Time since onset of injury/illness/exacerbation;Comorbidity 1;Education    Comorbidities mental disorder    Examination-Activity Limitations Sit;Squat;Stairs;Stand;Transfers;Locomotion Level    Examination-Participation Restrictions Community Activity;Other    Stability/Clinical Decision Making Evolving/Moderate complexity    Clinical Decision Making Moderate    Rehab Potential Good    PT Frequency 1x / week    PT Duration --   3 visits in first auth followed by 1/week 6 weeks   PT Treatment/Interventions ADLs/Self Care Home Management;Cryotherapy;Electrical Stimulation;Gait training;Neuromuscular re-education;Therapeutic exercise;Therapeutic activities;Functional mobility training;Stair training;Patient/family education;Manual techniques;Taping;Passive range of motion;Scar mobilization;Vasopneumatic Device    PT Next Visit Plan bike or nustep, no boot, standing weight shift, bands to ankle OKC    PT Home Exercise Plan [id number], ice 3xday 10-15 min    Consulted and Agree with Plan of Care Patient           Patient will benefit from skilled therapeutic intervention in order to improve the following deficits and impairments:  Abnormal gait, Decreased range of motion,  Difficulty walking, Decreased activity tolerance, Pain, Improper body mechanics, Impaired flexibility, Decreased strength  Visit Diagnosis: Status post open reduction with internal fixation (ORIF) of fracture of ankle - Plan: PT plan of care cert/re-cert, CANCELED: PT plan of care cert/re-cert  Pain in right ankle and joints of right foot - Plan: PT plan of care cert/re-cert, CANCELED: PT plan of care cert/re-cert  Difficulty in walking, not elsewhere classified - Plan: PT plan of care cert/re-cert, CANCELED: PT plan of care cert/re-cert  Localized edema - Plan: PT plan of care  cert/re-cert, CANCELED: PT plan of care cert/re-cert     Problem List Patient Active Problem List   Diagnosis Date Noted  . Acute blood loss anemia   . Leukopenia   . Vitamin D deficiency   . Acute lower UTI   . Postoperative pain   . Tibia/fibula fracture, right, sequela 04/15/2020  . Closed fracture of medial malleolus of right ankle 04/10/2020  . Open fracture of distal end of fibula and tibia, right, type I or II, initial encounter 04/09/2020  . Vaginal cancer ([place]) 07/22/2019  . Morbid obesity with BMI of 40.0-44.9, adult ([place]) 06/11/2019  . 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 Visit for routine gyn exam 04/21/2019  . Vaginal atrophy 04/21/2019  . Painful lumpy right breast 07/27/2016  . Urinary tract infection 07/01/2015  . Diastolic dysfunction 56/86/1683  . Constipation 05/12/2015  . ESBL (extended spectrum beta-lactamase) producing bacteria infection 03/17/2015  . Diarrhea 03/03/2015  . Weakness 03/02/2015  . 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 Anxiety state 02/20/2015  . Claustrophobia 02/20/2015  . Transaminitis 02/18/2015  . Chest pain 02/18/2015  . Abdominal pain   . Dizziness   . Hypotension 01/04/2015  . Bipolar disorder ([place]) 06/29/2012    Jordan [name] C. Jimmie Dattilio PT, DPT 07/25/20 4:22 PM   [facility] [address] [place], [place], [address] Phone: [phone]   Fax:  [phone]  Name: Jordan [name] MRN: [id number] Date of Birth: 20-Sep-1966

## 2020-07-28 NOTE — Progress Notes (Signed)
[place] Patient and [facility] Counseling Note   Session began late as nurse/caregiver took patient to another location. Session focused on the effects of mental illness, family concerns, and honesty vs lying. Patient described having both anxiety and bipolar disorder as "a bomb". Patient made lots of eye contact and her volume was loud. Patient is experiencing pain and distress because to her, "family comes first," but her family has not done that for her and now that they are reaching back out, she feels confused and upset. Next session will look at the benefits and challenges to reconnecting with her siblings and potentially explore distress tolerance techniques.        [name] Counseling Intern

## 2020-08-01 ENCOUNTER — Ambulatory Visit: Payer: Medicaid Other | Admitting: Gynecologic Oncology

## 2020-08-03 ENCOUNTER — Ambulatory Visit: Payer: Medicaid Other

## 2020-08-04 ENCOUNTER — Ambulatory Visit: Payer: Self-pay | Admitting: *Deleted

## 2020-08-04 NOTE — Telephone Encounter (Signed)
Patient is calling because she has not moved her bowels in 2 weeks. Patient was advised to reach out to her medical provider. Patient stated that she did not want to wait until Monday Called patient and patient c/o constipation and no BM x 2 weeks. Denies abdominal pain, vomiting. Reports normal pattern once or twice a day. Reviewed options to resolve constipation. Care advise given. Reviewed medications of possible causes of constipation. Patient verbalized understanding of care advise and to call back or go to [facility] or ED if symptoms worsen.   Reason for Disposition . Last bowel movement (BM) > 4 days ago  Answer Assessment - Initial Assessment Questions 1. STOOL PATTERN OR FREQUENCY: "How often do you pass bowel movements (BMs)?"  (Normal range: tid to q 3 days)  "When was the last BM passed?"       Once or twice a day LBM 2 weeks ago  2. STRAINING: "Do you have to strain to have a BM?"      No  3. RECTAL PAIN: "Does your rectum hurt when the stool comes out?" If Yes, ask: "Do you have hemorrhoids? How bad is the pain?"  (Scale 1-10; or mild, moderate, severe)     No  4. STOOL COMPOSITION: "Are the stools hard?"      No  5. BLOOD ON STOOLS: "Has there been any blood on the toilet tissue or on the surface of the BM?" If Yes, ask: "When was the last time?"      No  6. CHRONIC CONSTIPATION: "Is this a new problem for you?"  If no, ask: "How long have you had this problem?" (days, weeks, months)      No  7. CHANGES IN DIET OR HYDRATION: "Have there been any recent changes in your diet?" "How much fluids are you drinking consuming on a daily basis?"  "How much have you had to drink today?"     No drinks lots of water  8. MEDICATIONS: "Have you been taking any new medications?" "Are you taking any narcotic pain medications?" (e.g., Vicoden, Percocet, morphine, dilaudid)     Iron, oxycodone 9. LAXATIVES: "Have you been using any stool softeners, laxatives, or enemas?"  If yes, ask "What, how often,  and when was the last time?" No  10.ACTIVITY:  "How much walking do you do every day? on a daily basis?"  "Has your activity level decreased in the past week?"        na 11. CAUSE: "What do you think is causing the constipation?"        Does not know  12. OTHER SYMPTOMS: "Do you have any other symptoms?" (e.g., abdominal pain, bloating, fever, vomiting)       no 13. MEDICAL HISTORY: "Do you have a history of hemorrhoids, rectal fissures, or rectal surgery or rectal abscess?"         na 14. PREGNANCY: "Is there any chance you are pregnant?" "When was your last menstrual period?"       na  Protocols used: CONSTIPATION-A-AH

## 2020-08-04 NOTE — Progress Notes (Signed)
[place] Patient and [facility] Counseling Note   Patient arrived to session late, but let counselor know beforehand. During session, patient discussed her family, another counselor, medication, her experience and fall at her boyfriend's nursing home, some emotions and relationships. Patient was informed that she cannot see two counselor at once and counselor will seek advise from interim supervisor. Patient presented with an upset mood and a congruent affect. Patient was oriented times three and expressed no indication of SI/NSSI/HI. Patient does not feel heard in her life, which brings up anger and loneliness. This has also engendered a fear of losing the people she is closest to and their relationships. Additionally, because of her value of family, patient finds it hard to let family of origin go. Next session will further explore how counselor can assist patient and identifying her goals.      [name] Counseling Intern

## 2020-08-07 ENCOUNTER — Encounter: Payer: Self-pay | Admitting: Physical Therapy

## 2020-08-07 ENCOUNTER — Telehealth: Payer: Self-pay | Admitting: Oncology

## 2020-08-07 ENCOUNTER — Other Ambulatory Visit: Payer: Self-pay

## 2020-08-07 ENCOUNTER — Ambulatory Visit: Payer: Medicaid Other | Admitting: Physical Therapy

## 2020-08-07 DIAGNOSIS — M25571 Pain in right ankle and joints of right foot: Secondary | ICD-10-CM

## 2020-08-07 DIAGNOSIS — Z8781 Personal history of (healed) traumatic fracture: Secondary | ICD-10-CM

## 2020-08-07 DIAGNOSIS — R262 Difficulty in walking, not elsewhere classified: Secondary | ICD-10-CM

## 2020-08-07 DIAGNOSIS — Z9889 Other specified postprocedural states: Secondary | ICD-10-CM

## 2020-08-07 DIAGNOSIS — R600 Localized edema: Secondary | ICD-10-CM

## 2020-08-07 NOTE — Patient Instructions (Signed)
Access Code: [email or web address] URL: [email or web address] Date: 08/07/2020 Prepared by: Hilda Blades  Exercises Supine Single Leg Ankle Pumps - 3 x daily - 7 x weekly - 1 sets - 20 reps Supine Ankle Inversion Eversion AROM - 3 x daily - 7 x weekly - 1 sets - 20 reps Long Sitting Calf Stretch with Strap - 2 x daily - 7 x weekly - 2 sets - 30s hold

## 2020-08-07 NOTE — Telephone Encounter (Signed)
Jordan [name] called and wanted to talk about her family issues.  Actively listened and then discussed her upcoming appointments with [name] and Dr. Sondra Come.

## 2020-08-07 NOTE — Therapy (Signed)
[place], [place], [address] Phone: [phone]   Fax:  [phone]  Physical Therapy Treatment  Patient Details  Name: Shevonne Wolf MRN: [id number] Date of Birth: 06/03/1966 Referring Provider (PT): Delray Alt, PA-C   Encounter Date: 08/07/2020   PT End of Session - 08/07/20 1540    Visit Number 2    Number of Visits 4    Date for PT Re-Evaluation 08/26/20    Authorization Type MCD    Authorization Time Period 08/07/2020 - 08/27/2020    Authorization - Visit Number 1    Authorization - Number of Visits 3    PT Start Time 0938    PT Stop Time 1612    PT Time Calculation (min) 42 min    Activity Tolerance Patient tolerated treatment well    Behavior During Therapy [facility] for tasks assessed/performed           Past Medical History:  Diagnosis Date  . Alcohol abuse   . Anemia    patient denies  . Bipolar 1 disorder (HCC)    No medications currently  . CAP (community acquired pneumonia) 03/17/2015  . 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 Mental disorder   . Obesity   . PICC line infection 05/17/2015  . Sepsis due to Gram negative bacteria (MDR E Coli) 02/18/2015  . 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 UTI (lower urinary tract infection)   . Vaginal Pap smear, abnormal     Past Surgical History:  Procedure Laterality Date  . ABDOMINAL HYSTERECTOMY    . CERVICAL CONIZATION W/BX N/A 07/01/2016   Procedure: CONIZATION CERVIX WITH BIOPSY;  Surgeon: Chancy Milroy, MD;  Location: [facility];  Service: Gynecology;  Laterality: N/A;  . CHOLECYSTECTOMY    . EXTERNAL FIXATION LEG Right 04/09/2020   Procedure: EXTERNAL FIXATION LEG;  Surgeon: Hiram Gash, MD;  Location: [facility];  Service: Orthopedics;  Laterality: Right;  . HYSTEROSCOPY WITH D & C N/A 01/25/2016   Procedure: DILATATION AND CURETTAGE /HYSTEROSCOPY;  Surgeon: Mora Bellman, MD;  Location: [facility];  Service:  Gynecology;  Laterality: N/A;  . LYMPH NODE BIOPSY Bilateral 07/22/2019   Procedure: LYMPH NODE BIOPSY;  Surgeon: Everitt Amber, MD;  Location: [facility];  Service: Gynecology;  Laterality: Bilateral;  . OPEN REDUCTION INTERNAL FIXATION (ORIF) TIBIA/FIBULA FRACTURE Right 04/10/2020   Procedure: OPEN REDUCTION INTERNAL FIXATION (ORIF) TIBIA/FIBULA FRACTURE;  Surgeon: Shona Needles, MD;  Location: [facility];  Service: Orthopedics;  Laterality: Right;  . ROBOT ASSISTED MYOMECTOMY N/A 07/22/2019   Procedure: XI ROBOTIC ASSISTED LAPAROSCOPIC RADICAL UPPER VAGINECTOMY, LEFT SALPINECTOMY, RIGHT SALPINGOOOPHERECTOMY;  Surgeon: Everitt Amber, MD;  Location: [facility];  Service: Gynecology;  Laterality: N/A;  . VAGINAL HYSTERECTOMY N/A 03/11/2017   Procedure: HYSTERECTOMY VAGINAL WITH MORCELLATION;  Surgeon: Chancy Milroy, MD;  Location: [facility];  Service: Gynecology;  Laterality: N/A;    There were no vitals filed for this visit.   Subjective Assessment - 08/07/20 1536    Subjective Patient reports ankle hurts when she walks, she will sometimes walk without rollator at home. Once she is hurting, she will take her pain medication and lay down to rest until it feels better, then she will get up and walk some more. Patient states she knows her ankle will hurt the rest of her life even after bone is healed because her doctor told her.    Patient Stated Goals decrease pain,  get out boot, walk without AD (PLOF)    Currently in Pain? Yes    Pain Score 8     Pain Location Ankle    Pain Orientation Right    Pain Descriptors / Indicators Aching;Sharp    Pain Type Chronic pain;Surgical pain    Pain Onset More than a month ago    Pain Frequency Constant    Aggravating Factors  Walking, standing              [facility] PT Assessment - 08/07/20 0001      Assessment   Medical Diagnosis s/p Rt tib-fib ORIF    Referring Provider (PT) Delray Alt, PA-C    Onset Date/Surgical Date 04/10/20      AROM   Right Ankle  Dorsiflexion 8    Right Ankle Inversion 35    Right Ankle Eversion 20                         [facility] Adult PT Treatment/Exercise - 08/07/20 0001      Exercises   Exercises Ankle      Ankle Exercises: Stretches   Slant Board Stretch 2 reps;30 seconds      Ankle Exercises: Aerobic   Nustep L5 x 5 min with UE and LE for ankle motion and BLE endurance      Ankle Exercises: Standing   Other Standing Ankle Exercises Weight shifts: lateral 2 x 20      Ankle Exercises: Seated   Towel Crunch Limitations 2 x 20    Heel Raises 20 reps   2 sets   Toe Raise 20 reps   2 sets   BAPS Level 2;15 reps   2 sets   BAPS Limitations PF/DF, Inv/Ev, circles cw/ccw      Ankle Exercises: Supine   T-Band 4-way ankle with yellow 2 x 20 each                  PT Education - 08/07/20 1645    Education Details HEP, walking progression at home, ice/elevate and rest as needed    Person(s) Educated Patient    Methods Explanation;Verbal cues;Handout;Demonstration    Comprehension Verbalized understanding;Returned demonstration;Verbal cues required;Need further instruction            PT Short Term Goals - 07/25/20 1246      PT SHORT TERM GOAL #1   Title ankle DF to 5 deg    Baseline -2 at eval    Time 4    Period Weeks    Status New    Target Date 08/25/20      PT SHORT TERM GOAL #2   Title pt will demo full weight acceptance into Rt foot in static stance    Baseline partial weight bearing with need for UE assist on rollator    Time 4    Period Weeks    Status New    Target Date 08/25/20             PT Long Term Goals - 07/25/20 1248      PT LONG TERM GOAL #1   Title pt will be able to ambulate safely around the clinic without AD    Baseline full time use of rollator at eval    Time --   date TBD based off of authorization at re-eval     PT LONG TERM GOAL #2   Title pain with ADLs <=2/10    Baseline  3/10 at rest using boot and AD at eval      PT LONG TERM  GOAL #3   Title pt will ambulate in the community using LRAD    Baseline rollator at eval, PLOF was without AD, will determine need for AD based on balance and safety      PT LONG TERM GOAL #4   Title pt will demo SLS comfortably for at least 5s without UE assist to demo full weight acceptance as well as balance control for safety    Baseline unable at eval                 Plan - 08/07/20 1542    Clinical Impression Statement Patient tolerated therapy well with no adverse effects. Throughout therapy patient perseverated on that she would have life-long pain from her ankle surgery based on what her primary care doctor told her. She was able to tolerate progression in strengthening and mobility this visit, and she exhibits improved ankle motion. She did report constant high levels of pain but this did not limit therapy. No change made to HEP this visit, will update next visit based on patient's response to todays session. She would benefit from continued skilled PT to progress her motion and strength in order to reduce pain and improve walking ability to maximize function.    PT Treatment/Interventions ADLs/Self Care Home Management;Cryotherapy;Electrical Stimulation;Gait training;Neuromuscular re-education;Therapeutic exercise;Therapeutic activities;Functional mobility training;Stair training;Patient/family education;Manual techniques;Taping;Passive range of motion;Scar mobilization;Vasopneumatic Device    PT Next Visit Plan Progress ankle motion and strength, progress banded OKC strengthening, progress to standing strengthening PRN, gait training in // bars without AD    PT Home Exercise Plan [id number], ice and elevate 3x/day 10-15 min    Consulted and Agree with Plan of Care Patient           Patient will benefit from skilled therapeutic intervention in order to improve the following deficits and impairments:  Abnormal gait, Decreased range of motion, Difficulty walking, Decreased  activity tolerance, Pain, Improper body mechanics, Impaired flexibility, Decreased strength  Visit Diagnosis: Status post open reduction with internal fixation (ORIF) of fracture of ankle  Pain in right ankle and joints of right foot  Difficulty in walking, not elsewhere classified  Localized edema     Problem List Patient Active Problem List   Diagnosis Date Noted  . Acute blood loss anemia   . Leukopenia   . Vitamin D deficiency   . Acute lower UTI   . Postoperative pain   . Tibia/fibula fracture, right, sequela 04/15/2020  . Closed fracture of medial malleolus of right ankle 04/10/2020  . Open fracture of distal end of fibula and tibia, right, type I or II, initial encounter 04/09/2020  . Vaginal cancer ([place]) 07/22/2019  . Morbid obesity with BMI of 40.0-44.9, adult ([place]) 06/11/2019  . 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 Visit for routine gyn exam 04/21/2019  . Vaginal atrophy 04/21/2019  . Painful lumpy right breast 07/27/2016  . Urinary tract infection 07/01/2015  . Diastolic dysfunction 16/60/6301  . Constipation 05/12/2015  . ESBL (extended spectrum beta-lactamase) producing bacteria infection 03/17/2015  . Diarrhea 03/03/2015  . Weakness 03/02/2015  . 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 Anxiety state 02/20/2015  . Claustrophobia 02/20/2015  . Transaminitis 02/18/2015  . Chest pain 02/18/2015  . Abdominal pain   . Dizziness   . Hypotension 01/04/2015  . Bipolar disorder ([place]) 06/29/2012    Hilda Blades, PT, DPT, LAT, ATC 08/07/20  4:46 PM Phone: [phone] Fax: [facility] [address] [place], [place], [address] Phone: [phone]   Fax:  [phone]  Name: Timica Marcom MRN: [id number] Date of Birth: 1966/02/12

## 2020-08-10 ENCOUNTER — Telehealth: Payer: Self-pay | Admitting: Oncology

## 2020-08-10 ENCOUNTER — Telehealth: Payer: Self-pay | Admitting: *Deleted

## 2020-08-10 NOTE — Telephone Encounter (Addendum)
Jordan [name] called and said she started having vaginal pain like similar to when she finished radiation on Saturday.  She said it is worse with activity and is rating it at a 2/10 know.  She is taking oxycodone which she takes for back pain and it does help.  She denies having any vaginal bleeding or urinary symptoms.  She is currently taking an antibiotic for chronic UTI's.  She also reports having constipation and has not had a bowel movement in 10 days. Her daughter has brought her magnesium citrate  to try but she is scared to try it.  Discussed using Miralax but she does not have a way to get it.    She has also left a message for Dr. Serita Grit office.

## 2020-08-10 NOTE — Telephone Encounter (Signed)
Jordan [name] called back and said she has had small bowel movements since taking the mag citrate.  Advised her that she can take the other half of the bottle if she needs to.  She said she is worried that she will have to get up during the night and wants to wait until tomorrow afternoon.  Also advised her to go to the ER if she has severe pain during the night.  She verbalized understanding.

## 2020-08-10 NOTE — Telephone Encounter (Signed)
Called Jordan [name] back and advised her that the pain is most likely being caused from her constipation.  Advised her to try the mag citrate and to call back this afternoon with how she is feeling.

## 2020-08-10 NOTE — Telephone Encounter (Signed)
Patient called and stated "I have been having pain in my vagina since Saturday. I need to know if this is related to the cancer, or if the cancer has come back." Explained that the message w

## 2020-08-11 ENCOUNTER — Telehealth: Payer: Self-pay

## 2020-08-11 NOTE — Progress Notes (Signed)
[facility] Patient and [facility] Counseling Note   Session covered a variety of topics related to family, such as her boyfriend calling her brothers, her concerns about her daughter, and her boyfriend's jealousy. Patient often over thinks and worries about "what's next." Patient struggles figuring out how to deal with big life problems and does not have coping skills to deal with problems that cannot be changed. Additionally, patient's experience with bipolar disorder and menopause were discussed. Patient was oriented times three, showed no signs of ideation, and had a frustrated, angry mood. She did calm down as the session continued. Her affect was labile and exaggerated and she changed the subject several times. Patient thinks that she cannot solve the problems in her life, which makes her feel frustrated and angry, which often results in her not knowing what to do or yelling. Patient might benefit from coping skills and problem solving skills. Next session will check in about the suggestion of journaling and potentially explore other skills.    [name] Counseling Intern

## 2020-08-11 NOTE — Telephone Encounter (Signed)
Called to reschedule.  [name] Counseling Intern

## 2020-08-11 NOTE — Telephone Encounter (Signed)
Jordan [name] called and said she is feeling better today with less pain.  She did not have any more bowel movements last night and is going to try the second half of the mag citrate this afternoon after her appointments.

## 2020-08-13 ENCOUNTER — Emergency Department (HOSPITAL_COMMUNITY)
Admission: EM | Admit: 2020-08-13 | Discharge: 2020-08-13 | Disposition: A | Discharge status: Home / Self Care | Payer: Medicaid Other | Attending: Emergency Medicine | Admitting: Emergency Medicine

## 2020-08-13 DIAGNOSIS — R1030 Lower abdominal pain, unspecified: Secondary | ICD-10-CM | POA: Diagnosis not present

## 2020-08-13 DIAGNOSIS — K5900 Constipation, unspecified: Secondary | ICD-10-CM | POA: Diagnosis not present

## 2020-08-13 DIAGNOSIS — Z7901 Long term (current) use of anticoagulants: Secondary | ICD-10-CM | POA: Diagnosis not present

## 2020-08-13 DIAGNOSIS — N3001 Acute cystitis with hematuria: Secondary | ICD-10-CM

## 2020-08-13 DIAGNOSIS — K625 Hemorrhage of anus and rectum: Secondary | ICD-10-CM | POA: Diagnosis present

## 2020-08-13 LAB — URINALYSIS, ROUTINE W REFLEX MICROSCOPIC
Bacteria, UA: NONE SEEN
Bilirubin Urine: NEGATIVE
Glucose, UA: NEGATIVE mg/dL
Ketones, ur: 5 mg/dL — AB
Nitrite: NEGATIVE
Protein, ur: 100 mg/dL — AB
RBC / HPF: >50 RBC/hpf — ABNORMAL HIGH (ref 0–5)
Specific Gravity, Urine: 1.019 (ref 1.005–1.030)
WBC, UA: >50 WBC/hpf — ABNORMAL HIGH (ref 0–5)
pH: 6 (ref 5.0–8.0)

## 2020-08-13 LAB — COMPREHENSIVE METABOLIC PANEL
ALT: 17 U/L (ref 0–44)
AST: 23 U/L (ref 15–41)
Albumin: 4.3 g/dL (ref 3.5–5.0)
Alkaline Phosphatase: 120 U/L (ref 38–126)
Anion gap: 8 (ref 5–15)
BUN: 12 mg/dL (ref 6–20)
CO2: 26 mmol/L (ref 22–32)
Calcium: 9.4 mg/dL (ref 8.9–10.3)
Chloride: 106 mmol/L (ref 98–111)
Creatinine, Ser: 0.76 mg/dL (ref 0.44–1.00)
GFR, Estimated: >60 mL/min (ref >60)
Glucose, Bld: 105 mg/dL — ABNORMAL HIGH (ref 70–99)
Potassium: 4.4 mmol/L (ref 3.5–5.1)
Sodium: 140 mmol/L (ref 135–145)
Total Bilirubin: 1.3 mg/dL — ABNORMAL HIGH (ref 0.3–1.2)
Total Protein: 6.9 g/dL (ref 6.5–8.1)

## 2020-08-13 LAB — CBC WITH DIFFERENTIAL/PLATELET
Abs Immature Granulocytes: 0.02 10*3/uL (ref 0.00–0.07)
Basophils Absolute: 0 10*3/uL (ref 0.0–0.1)
Basophils Relative: 0 %
Eosinophils Absolute: 0 10*3/uL (ref 0.0–0.5)
Eosinophils Relative: 0 %
HCT: 43.9 % (ref 36.0–46.0)
Hemoglobin: 14.9 g/dL (ref 12.0–15.0)
Immature Granulocytes: 0 %
Lymphocytes Relative: 14 %
Lymphs Abs: 0.9 10*3/uL (ref 0.7–4.0)
MCH: 30.8 pg (ref 26.0–34.0)
MCHC: 33.9 g/dL (ref 30.0–36.0)
MCV: 90.7 fL (ref 80.0–100.0)
Monocytes Absolute: 0.4 10*3/uL (ref 0.1–1.0)
Monocytes Relative: 6 %
Neutro Abs: 4.7 10*3/uL (ref 1.7–7.7)
Neutrophils Relative %: 80 %
Platelets: 171 10*3/uL (ref 150–400)
RBC: 4.84 MIL/uL (ref 3.87–5.11)
RDW: 13.5 % (ref 11.5–15.5)
WBC: 6 10*3/uL (ref 4.0–10.5)
nRBC: 0 % (ref 0.0–0.2)

## 2020-08-13 LAB — POC OCCULT BLOOD, ED: Fecal Occult Bld: NEGATIVE

## 2020-08-13 MED ORDER — NITROFURANTOIN MONOHYD MACRO 100 MG PO CAPS
100.0000 mg | ORAL_CAPSULE | Freq: Once | ORAL | Status: DC
  Filled 2020-08-13: qty 1

## 2020-08-13 MED ORDER — FOSFOMYCIN TROMETHAMINE 3 G PO PACK
3.0000 g | PACK | Freq: Once | ORAL | Status: DC
  Filled 2020-08-13: qty 3

## 2020-08-13 MED ORDER — FENTANYL CITRATE (PF) 100 MCG/2ML IJ SOLN
100.0000 ug | Freq: Once | INTRAMUSCULAR | Status: AC
  Administered 2020-08-13: 100 ug via INTRAVENOUS
  Filled 2020-08-13: qty 2

## 2020-08-13 MED ORDER — ONDANSETRON HCL 4 MG/2ML IJ SOLN
4.0000 mg | Freq: Once | INTRAMUSCULAR | Status: AC
  Administered 2020-08-13: 4 mg via INTRAVENOUS
  Filled 2020-08-13: qty 2

## 2020-08-13 NOTE — ED Provider Notes (Signed)
[facility] DEPT Provider Note   CSN: [id number] Arrival date & time: 08/13/20  1442     History Chief Complaint  Patient presents with  . Rectal Bleeding    Jordan [name] is a 54 y.o. female.  Patient with history of vaginal cancer, previous surgery and hysterectomy, bipolar disorder presents the emergency department for evaluation of bright red blood per rectum and constipation.  Patient takes 50 mg oxycodone daily for chronic pain.  She states that she has been constipated for the past several weeks.  She took a laxative (magnesium citrate) yesterday with results.  Today patient had a bowel movement and noted bright red blood in the toilet bowl, prompting emergency department visit today.  She has also had lower abdominal pain and cramping.  Patient has history of frequent UTIs, previously on Macrobid prophylaxis, currently taking course of cephalexin after urologist diagnosed E. coli UTI on 07/27/2020.  Patient states that she continues to take the antibiotics.  She is not on anticoagulation.  She has had nausea but no vomiting.  No chest pain or shortness of breath.  No lightheadedness or syncope.  She is concerned that her cancer has come back. States lower abd pain is burning, reminding her of pain she had with previous radiation treatments.         Past Medical History:  Diagnosis Date  . Alcohol abuse   . Anemia    patient denies  . Bipolar 1 disorder (HCC)    No medications currently  . CAP (community acquired pneumonia) 03/17/2015  . 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 Mental disorder   . Obesity   . PICC line infection 05/17/2015  . Sepsis due to Gram negative bacteria (MDR E Coli) 02/18/2015  . 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 UTI (lower urinary tract infection)   . Vaginal Pap smear, abnormal     Patient Active Problem List   Diagnosis Date Noted  . Acute blood loss anemia   . Leukopenia   .  Vitamin D deficiency   . Acute lower UTI   . Postoperative pain   . Tibia/fibula fracture, right, sequela 04/15/2020  . Closed fracture of medial malleolus of right ankle 04/10/2020  . Open fracture of distal end of fibula and tibia, right, type I or II, initial encounter 04/09/2020  . Vaginal cancer ([place]) 07/22/2019  . Morbid obesity with BMI of 40.0-44.9, adult ([place]) 06/11/2019  . 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 Visit for routine gyn exam 04/21/2019  . Vaginal atrophy 04/21/2019  . Painful lumpy right breast 07/27/2016  . Urinary tract infection 07/01/2015  . Diastolic dysfunction 48/18/5631  . Constipation 05/12/2015  . ESBL (extended spectrum beta-lactamase) producing bacteria infection 03/17/2015  . Diarrhea 03/03/2015  . Weakness 03/02/2015  . 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 Anxiety state 02/20/2015  . Claustrophobia 02/20/2015  . Transaminitis 02/18/2015  . Chest pain 02/18/2015  . Abdominal pain   . Dizziness   . Hypotension 01/04/2015  . Bipolar disorder ([place]) 06/29/2012    Past Surgical History:  Procedure Laterality Date  . ABDOMINAL HYSTERECTOMY    . CERVICAL CONIZATION W/BX N/A 07/01/2016   Procedure: CONIZATION CERVIX WITH BIOPSY;  Surgeon: Chancy Milroy, MD;  Location: [facility];  Service: Gynecology;  Laterality: N/A;  . CHOLECYSTECTOMY    . EXTERNAL FIXATION LEG Right 04/09/2020   Procedure: EXTERNAL FIXATION LEG;  Surgeon: Hiram Gash, MD;  Location: [facility]  OR;  Service: Orthopedics;  Laterality: Right;  . HYSTEROSCOPY WITH D & C N/A 01/25/2016   Procedure: DILATATION AND CURETTAGE /HYSTEROSCOPY;  Surgeon: Mora Bellman, MD;  Location: [facility];  Service: Gynecology;  Laterality: N/A;  . LYMPH NODE BIOPSY Bilateral 07/22/2019   Procedure: LYMPH NODE BIOPSY;  Surgeon: Everitt Amber, MD;  Location: [facility];  Service: Gynecology;  Laterality: Bilateral;  . OPEN REDUCTION INTERNAL FIXATION (ORIF) TIBIA/FIBULA FRACTURE Right 04/10/2020   Procedure:  OPEN REDUCTION INTERNAL FIXATION (ORIF) TIBIA/FIBULA FRACTURE;  Surgeon: Shona Needles, MD;  Location: [facility];  Service: Orthopedics;  Laterality: Right;  . ROBOT ASSISTED MYOMECTOMY N/A 07/22/2019   Procedure: XI ROBOTIC ASSISTED LAPAROSCOPIC RADICAL UPPER VAGINECTOMY, LEFT SALPINECTOMY, RIGHT SALPINGOOOPHERECTOMY;  Surgeon: Everitt Amber, MD;  Location: [facility];  Service: Gynecology;  Laterality: N/A;  . VAGINAL HYSTERECTOMY N/A 03/11/2017   Procedure: HYSTERECTOMY VAGINAL WITH MORCELLATION;  Surgeon: Chancy Milroy, MD;  Location: [facility];  Service: Gynecology;  Laterality: N/A;     OB History    Gravida  1   Para  1   Term  1   Preterm      AB      Living  1     SAB      TAB      Ectopic      Multiple      Live Births  1           Family History  Problem Relation Age of Onset  . Alcohol abuse Father   . Cancer Father   . Breast cancer Neg Hx     Social History   Tobacco Use  . Smoking status: Never Smoker  . Smokeless tobacco: Never Used  Vaping Use  . Vaping Use: Never used  Substance Use Topics  . Alcohol use: Not Currently  . Drug use: No    Home Medications Prior to Admission medications   Medication Sig Start Date End Date Taking? Authorizing Provider  acetaminophen (TYLENOL) 325 MG tablet Take 1-2 tablets (325-650 mg total) by mouth every 4 (four) hours as needed for mild pain. 05/04/20   Love, Ivan Anchors, PA-C  ALPRAZolam (XANAX) 0.25 MG tablet Take 1 tablet (0.25 mg total) by mouth 2 (two) times daily as needed for anxiety. 05/04/20   Love, Ivan Anchors, PA-C  cholecalciferol (VITAMIN D) 25 MCG tablet Take 1 tablet (1,000 Units total) by mouth daily. 05/05/20   Love, Ivan Anchors, PA-C  enoxaparin (LOVENOX) 40 MG/0.4ML injection Inject 0.4 mLs (40 mg total) into the skin daily. Patient not taking: Reported on 07/25/2020 04/16/20   Chadwell, Vonna Kotyk, PA-C  Ferrous Sulfate (IRON) 325 (65 Fe) MG TABS Take 1 tablet by mouth every other day. 02/03/20   [provider]  gabapentin (NEURONTIN) 100 MG capsule Take 1 capsule (100 mg total) by mouth 3 (three) times daily. 04/15/20   Chadwell, Vonna Kotyk, PA-C  lurasidone (LATUDA) 20 MG TABS tablet Take 1 tablet (20 mg total) by mouth daily. 05/04/20   Love, Ivan Anchors, PA-C  naproxen (NAPROSYN) 250 MG tablet Take 1 tablet (250 mg total) by mouth 2 (two) times daily with a meal. 04/15/20   Chadwell, Vonna Kotyk, PA-C  ondansetron (ZOFRAN) 4 MG tablet Take 1 tablet (4 mg total) by mouth every 6 (six) hours as needed for nausea. 04/15/20   Chadwell, Vonna Kotyk, PA-C  OXcarbazepine (TRILEPTAL) 150 MG tablet Take 0.5 tablets (75 mg total) by mouth in the morning and at bedtime. Patient not taking:  Reported on 07/25/2020 05/04/20   Bary Leriche, PA-C  oxybutynin (DITROPAN-XL) 10 MG 24 hr tablet Take 10 mg by mouth daily. 03/31/20   [provider]  Oxycodone HCl 10 MG TABS Take 1-1.5 tablets (10-15 mg total) by mouth 4 (four) times daily as needed. 05/04/20   Love, Ivan Anchors, PA-C  saccharomyces boulardii (FLORASTOR) 250 MG capsule Take 1 capsule (250 mg total) by mouth 2 (two) times daily. Patient not taking: Reported on 07/25/2020 05/04/20   Bary Leriche, PA-C    Allergies    Ciprofloxacin, Citalopram, Lamotrigine, Sulfa antibiotics, and Tramadol  Review of Systems   Review of Systems  Constitutional: Negative for fever.  HENT: Negative for rhinorrhea and sore throat.   Eyes: Negative for redness.  Respiratory: Negative for cough.   Cardiovascular: Negative for chest pain.  Gastrointestinal: Positive for abdominal pain, blood in stool and nausea. Negative for diarrhea and vomiting.  Genitourinary: Positive for dysuria. Negative for frequency, hematuria and urgency.  Musculoskeletal: Negative for myalgias.  Skin: Negative for rash.  Neurological: Negative for headaches.    Physical Exam Updated Vital Signs BP (!) 153/101 (BP Location: Left Arm)   Pulse 90   Temp 98 F (36.7 C) (Oral)   Resp 16   Ht 5'  8" (1.727 m)   Wt (!) 154.2 kg   LMP  (LMP Unknown)   SpO2 99%   BMI 51.70 kg/m   Physical Exam Vitals and nursing note reviewed. Exam conducted with a chaperone present.  Constitutional:      General: She is not in acute distress.    Appearance: She is well-developed.  HENT:     Head: Normocephalic and atraumatic.     Right Ear: External ear normal.     Left Ear: External ear normal.     Nose: Nose normal.  Eyes:     Conjunctiva/sclera: Conjunctivae normal.  Cardiovascular:     Rate and Rhythm: Normal rate and regular rhythm.     Heart sounds: No murmur heard.   Pulmonary:     Effort: No respiratory distress.     Breath sounds: No wheezing, rhonchi or rales.  Abdominal:     Palpations: Abdomen is soft.     Tenderness: There is abdominal tenderness (suprapubic). There is no guarding or rebound.  Genitourinary:    Labia:        Right: No rash or lesion.        Left: No rash or lesion.      Rectum: Guaiac result negative.  Musculoskeletal:     Cervical back: Normal range of motion and neck supple.     Right lower leg: No edema.     Left lower leg: No edema.  Skin:    General: Skin is warm and dry.     Findings: No rash.  Neurological:     General: No focal deficit present.     Mental Status: She is alert. Mental status is at baseline.     Motor: No weakness.  Psychiatric:        Mood and Affect: Mood normal.     ED Results / Procedures / Treatments   Labs (all labs ordered are listed, but only abnormal results are displayed) Labs Reviewed  COMPREHENSIVE METABOLIC PANEL - Abnormal; Notable for the following components:      Result Value   Glucose, Bld 105 (*)    Total Bilirubin 1.3 (*)    All other components within normal limits  URINALYSIS,  ROUTINE W REFLEX MICROSCOPIC - Abnormal; Notable for the following components:   APPearance CLOUDY (*)    Hgb urine dipstick LARGE (*)    Ketones, ur 5 (*)    Protein, ur 100 (*)    Leukocytes,Ua LARGE (*)    RBC /  HPF >50 (*)    WBC, UA >50 (*)    All other components within normal limits  URINE CULTURE  CBC WITH DIFFERENTIAL/PLATELET  POC OCCULT BLOOD, ED    EKG None  Radiology No results found.  Procedures Procedures (including critical care time)  Medications Ordered in ED Medications  ondansetron (ZOFRAN) injection 4 mg (4 mg Intravenous Given 08/13/20 1539)  fentaNYL (SUBLIMAZE) injection 100 mcg (100 mcg Intravenous Given 08/13/20 1548)    ED Course  I have reviewed the triage vital signs and the nursing notes.  Pertinent labs & imaging results that were available during my care of the patient were reviewed by me and considered in my medical decision making (see chart for details).  Patient seen and examined. Work-up initiated. Medications ordered.  Pt is concerned she has recurrent cancer.   Vital signs reviewed and are as follows: BP (!) 153/101 (BP Location: Left Arm)   Pulse 90   Temp 98 F (36.7 C) (Oral)   Resp 16   Ht 5\' 8"  (1.727 m)   Wt (!) 154.2 kg   LMP  (LMP Unknown)   SpO2 99%   BMI 51.70 kg/m   6:22 PM patient updated on all results.  In regards to UTI: We will send culture.  Patient continues to take prescription of Keflex.  I reviewed culture results from [facility] and it appeared to be sensitive to cephalexin.  I have asked the patient to follow-up with her urologist to recheck culture results to determine if she needs a prolonged course of antibiotics or a change in therapy.  We discussed return to the emergency department with fever, vomiting.  In regards to bleeding: Hemoccult negative tonight.  I doubt GI source.  Patient possibly noted blood from urine and ongoing UTI.  Patient has lower abdominal tenderness, possibly due to UTI.  I discussed with patient, that her UTI needs to be successfully treated.  If she continues to have pain or bleeding, she should follow-up with her gynecologist oncologist for further evaluation.  Patient stable for  discharge to home at this time.  She is eating and drinking in her room.    MDM Rules/Calculators/A&P                          Patient with blood noted in toilet, lower abdominal pain.  She has had previous vaginal cancer.  Suspect that her symptoms are related to ongoing UTI.  Plan as above as patient is already on antibiotics for this.  Normal white blood cell count.  Do not suspect sepsis.  Patient evaluated for GI bleeding with Hemoccult which was negative.  Advise follow-up with urology to successfully treat UTI.  If she continues to have pain or bleeding, she may need gynecology oncology evaluation.   Final Clinical Impression(s) / ED Diagnoses Final diagnoses:  Lower abdominal pain    Rx / DC Orders ED Discharge Orders    None       Carlisle Cater, PA-C 08/13/20 Greer, Wenda Overland, MD 08/13/20 2100

## 2020-08-13 NOTE — ED Triage Notes (Signed)
Pt arrived via EMS. Bright red GI bleed started this morning. Patient took laxative, does not know which one. Took two oxycodone prescribed to her last year. She is also experiencing nausea. Experienced constipation for two weeks, that was relieved yesterday via laxatives.

## 2020-08-13 NOTE — Discharge Instructions (Signed)
Please read and follow all provided instructions.  Your diagnoses today include:  1. Lower abdominal pain     Tests performed today include:  Blood counts and electrolytes - were normal  Blood tests to check liver and kidney function - were normal  Urine test to look for infection  Vital signs. See below for your results today.   Medications prescribed:   Colace - stool softener  This medication can be found over-the-counter.   Continue colace for 2 weeks after your stools return to normal to prevent constipation.    Miralax - laxative  This medication can be found over-the-counter.   Take any prescribed medications only as directed.  Home care instructions:   Follow any educational materials contained in this packet.  Follow-up instructions: Please follow-up with your primary care provider in the next 3 days for further evaluation of your symptoms.    Return instructions:  SEEK IMMEDIATE MEDICAL ATTENTION IF:  The pain does not go away or becomes severe   A temperature above 101F develops   Repeated vomiting occurs (multiple episodes)   The pain becomes localized to portions of the abdomen. The right side could possibly be appendicitis. In an adult, the left lower portion of the abdomen could be colitis or diverticulitis.   Blood is being passed in stools or vomit (bright red or black tarry stools)   You develop chest pain, difficulty breathing, dizziness or fainting, or become confused, poorly responsive, or inconsolable (young children)  If you have any other emergent concerns regarding your health  Additional Information: Abdominal (belly) pain can be caused by many things. Your caregiver performed an examination and possibly ordered blood/urine tests and imaging (CT scan, x-rays, ultrasound). Many cases can be observed and treated at home after initial evaluation in the emergency department. Even though you are being discharged home, abdominal pain can be  unpredictable. Therefore, you need a repeated exam if your pain does not resolve, returns, or worsens. Most patients with abdominal pain don't have to be admitted to the hospital or have surgery, but serious problems like appendicitis and gallbladder attacks can start out as nonspecific pain. Many abdominal conditions cannot be diagnosed in one visit, so follow-up evaluations are very important.  Your vital signs today were: BP 138/83 (BP Location: Left Arm)    Pulse 82    Temp 98 F (36.7 C) (Oral)    Resp 18    Ht 5\' 8"  (1.727 m)    Wt (!) 154.2 kg    LMP  (LMP Unknown)    SpO2 99%    BMI 51.70 kg/m  If your blood pressure (bp) was elevated above 135/85 this visit, please have this repeated by your doctor within one month. --------------

## 2020-08-14 ENCOUNTER — Telehealth: Payer: Self-pay | Admitting: Oncology

## 2020-08-14 NOTE — Telephone Encounter (Addendum)
Jordan [name] called and said she went to the ER over the weekend for bleeding.  She thinks it is vaginal bleeding and is also having cramping like a period.  She said the pain is like after she finished radiation. She reports having a bowel movement on Saturday.  Also discussed that she does not think the blood was in her urine or from her rectum.  Advised her that I will check with Dr. Serita Grit office and call her back.

## 2020-08-14 NOTE — Telephone Encounter (Signed)
Called Jordan [name] back and discussed that [name] would like to wait for the urine culture to come back to see if a UTI is causing her pain/bleeding.  Advised her that I will call her when the culture is back.  Tanis said she has contacted her urologist and has an appointment for 08/17/20.

## 2020-08-15 ENCOUNTER — Encounter: Payer: Self-pay | Admitting: Physical Therapy

## 2020-08-15 ENCOUNTER — Telehealth: Payer: Self-pay

## 2020-08-15 ENCOUNTER — Telehealth: Payer: Self-pay | Admitting: Oncology

## 2020-08-15 ENCOUNTER — Ambulatory Visit: Payer: Medicaid Other | Admitting: Physical Therapy

## 2020-08-15 ENCOUNTER — Other Ambulatory Visit: Payer: Self-pay

## 2020-08-15 DIAGNOSIS — M25571 Pain in right ankle and joints of right foot: Secondary | ICD-10-CM

## 2020-08-15 DIAGNOSIS — R600 Localized edema: Secondary | ICD-10-CM

## 2020-08-15 DIAGNOSIS — Z8781 Personal history of (healed) traumatic fracture: Secondary | ICD-10-CM

## 2020-08-15 DIAGNOSIS — Z9889 Other specified postprocedural states: Secondary | ICD-10-CM

## 2020-08-15 DIAGNOSIS — R262 Difficulty in walking, not elsewhere classified: Secondary | ICD-10-CM

## 2020-08-15 NOTE — Telephone Encounter (Signed)
Left message after missed calls from [place].  [name] Counseling Intern

## 2020-08-15 NOTE — Therapy (Signed)
[place], [place], [address] Phone: [phone]   Fax:  [phone]  Physical Therapy Treatment  Patient Details  Name: Jordan [name] MRN: [id number] Date of Birth: [birth year] Referring Provider (PT): Delray Alt, PA-C   Encounter Date: 08/15/2020   PT End of Session - 08/15/20 1456    Visit Number 3    Number of Visits 4    Date for PT Re-Evaluation 08/26/20    Authorization Type MCD    Authorization Time Period 08/07/2020 - 08/27/2020    Authorization - Visit Number 2    Authorization - Number of Visits 3    PT Start Time 4008    PT Stop Time 1524    PT Time Calculation (min) 38 min    Activity Tolerance Patient tolerated treatment well    Behavior During Therapy [facility] for tasks assessed/performed           Past Medical History:  Diagnosis Date  . Alcohol abuse   . Anemia    patient denies  . Bipolar 1 disorder (HCC)    No medications currently  . CAP (community acquired pneumonia) 03/17/2015  . 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 Mental disorder   . Obesity   . PICC line infection 05/17/2015  . Sepsis due to Gram negative bacteria (MDR E Coli) 02/18/2015  . 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 UTI (lower urinary tract infection)   . Vaginal Pap smear, abnormal     Past Surgical History:  Procedure Laterality Date  . ABDOMINAL HYSTERECTOMY    . CERVICAL CONIZATION W/BX N/A 07/01/2016   Procedure: CONIZATION CERVIX WITH BIOPSY;  Surgeon: Chancy Milroy, MD;  Location: [facility];  Service: Gynecology;  Laterality: N/A;  . CHOLECYSTECTOMY    . EXTERNAL FIXATION LEG Right 04/09/2020   Procedure: EXTERNAL FIXATION LEG;  Surgeon: Hiram Gash, MD;  Location: [facility];  Service: Orthopedics;  Laterality: Right;  . HYSTEROSCOPY WITH D & C N/A 01/25/2016   Procedure: DILATATION AND CURETTAGE /HYSTEROSCOPY;  Surgeon: Mora Bellman, MD;  Location: [facility];  Service:  Gynecology;  Laterality: N/A;  . LYMPH NODE BIOPSY Bilateral 07/22/2019   Procedure: LYMPH NODE BIOPSY;  Surgeon: Everitt Amber, MD;  Location: [facility];  Service: Gynecology;  Laterality: Bilateral;  . OPEN REDUCTION INTERNAL FIXATION (ORIF) TIBIA/FIBULA FRACTURE Right 04/10/2020   Procedure: OPEN REDUCTION INTERNAL FIXATION (ORIF) TIBIA/FIBULA FRACTURE;  Surgeon: Shona Needles, MD;  Location: [facility];  Service: Orthopedics;  Laterality: Right;  . ROBOT ASSISTED MYOMECTOMY N/A 07/22/2019   Procedure: XI ROBOTIC ASSISTED LAPAROSCOPIC RADICAL UPPER VAGINECTOMY, LEFT SALPINECTOMY, RIGHT SALPINGOOOPHERECTOMY;  Surgeon: Everitt Amber, MD;  Location: [facility];  Service: Gynecology;  Laterality: N/A;  . VAGINAL HYSTERECTOMY N/A 03/11/2017   Procedure: HYSTERECTOMY VAGINAL WITH MORCELLATION;  Surgeon: Chancy Milroy, MD;  Location: [facility];  Service: Gynecology;  Laterality: N/A;    There were no vitals filed for this visit.   Subjective Assessment - 08/15/20 1452    Subjective Patient reports continued leg pain stating "my leg is broken." States she has good days and bad days, just depends.    Patient Stated Goals decrease pain, get out boot, walk without AD (PLOF)    Currently in Pain? Yes    Pain Score 3     Pain Orientation Right    Pain Descriptors / Indicators Aching;Sharp    Pain Type Acute pain    Pain  Onset More than a month ago    Pain Frequency Constant              [facility] PT Assessment - 08/15/20 0001      Assessment   Medical Diagnosis s/p Rt tib-fib ORIF    Referring Provider (PT) Delray Alt, PA-C    Onset Date/Surgical Date 04/10/20                         [facility] Adult PT Treatment/Exercise - 08/15/20 0001      Exercises   Exercises Ankle      Ankle Exercises: Stretches   Gastroc Stretch 2 reps;30 seconds    Gastroc Stretch Limitations standing at counter      Ankle Exercises: Aerobic   Nustep L5 x 5 min with UE and LE for ankle motion and BLE endurance        Ankle Exercises: Standing   Heel Raises 10 reps   2 sets     Ankle Exercises: Seated   Towel Crunch Limitations 2 x 20    Heel Raises 20 reps   2 sets   Toe Raise 20 reps   2 sets   Other Seated Ankle Exercises Banded PF, Inv, Ev with red 2 x 20, 1 x 20 for demonstration of HEP                  PT Education - 08/15/20 1455    Education Details HEP update    Person(s) Educated Patient    Methods Explanation;Demonstration;Verbal cues;Handout    Comprehension Verbalized understanding;Returned demonstration;Verbal cues required;Need further instruction            PT Short Term Goals - 07/25/20 1246      PT SHORT TERM GOAL #1   Title ankle DF to 5 deg    Baseline -2 at eval    Time 4    Period Weeks    Status New    Target Date 08/25/20      PT SHORT TERM GOAL #2   Title pt will demo full weight acceptance into Rt foot in static stance    Baseline partial weight bearing with need for UE assist on rollator    Time 4    Period Weeks    Status New    Target Date 08/25/20             PT Long Term Goals - 07/25/20 1248      PT LONG TERM GOAL #1   Title pt will be able to ambulate safely around the clinic without AD    Baseline full time use of rollator at eval    Time --   date TBD based off of authorization at re-eval     PT LONG TERM GOAL #2   Title pain with ADLs <=2/10    Baseline 3/10 at rest using boot and AD at eval      PT LONG TERM GOAL #3   Title pt will ambulate in the community using LRAD    Baseline rollator at eval, PLOF was without AD, will determine need for AD based on balance and safety      PT LONG TERM GOAL #4   Title pt will demo SLS comfortably for at least 5s without UE assist to demo full weight acceptance as well as balance control for safety    Baseline unable at eval  Plan - 08/15/20 1525    Clinical Impression Statement Patient tolerated therapy well with no adverse effects. Continued progression  for strengthening this visit with good tolerance. She does require consistent cueing to stay on task with exercises and needed assistance to figure out methods to modify exercises so she could perform them independently at home. She was able to demonstrate all exercises appropriately so updated HEP. Patient continues to express general fear of re-injuring leg by falling and states that she will continue to have pain even after the leg has healed. She would benefit from continued skilled PT to progress her motion and strength in order to reduce pain and improve walking ability to maximize function.    PT Treatment/Interventions ADLs/Self Care Home Management;Cryotherapy;Electrical Stimulation;Gait training;Neuromuscular re-education;Therapeutic exercise;Therapeutic activities;Functional mobility training;Stair training;Patient/family education;Manual techniques;Taping;Passive range of motion;Scar mobilization;Vasopneumatic Device    PT Next Visit Plan Progress ankle motion and strength, progress banded [place] strengthening, progress to standing strengthening PRN, gait training in // bars without AD    PT Home Exercise Plan [email or web address]    Consulted and Agree with Plan of Care Patient           Patient will benefit from skilled therapeutic intervention in order to improve the following deficits and impairments:  Abnormal gait, Decreased range of motion, Difficulty walking, Decreased activity tolerance, Pain, Improper body mechanics, Impaired flexibility, Decreased strength  Visit Diagnosis: Status post open reduction with internal fixation (ORIF) of fracture of ankle  Pain in right ankle and joints of right foot  Difficulty in walking, not elsewhere classified  Localized edema     Problem List Patient Active Problem List   Diagnosis Date Noted  . Acute blood loss anemia   . Leukopenia   . Vitamin D deficiency   . Acute lower UTI   . Postoperative pain   . Tibia/fibula fracture, right, sequela  04/15/2020  . Closed fracture of medial malleolus of right ankle 04/10/2020  . Open fracture of distal end of fibula and tibia, right, type I or II, initial encounter 04/09/2020  . Vaginal cancer ([place]) 07/22/2019  . Morbid obesity with BMI of 40.0-44.9, adult ([place]) 06/11/2019  . 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 Visit for routine gyn exam 04/21/2019  . Vaginal atrophy 04/21/2019  . Painful lumpy right breast 07/27/2016  . Urinary tract infection 07/01/2015  . Diastolic dysfunction 80/12/4915  . Constipation 05/12/2015  . ESBL (extended spectrum beta-lactamase) producing bacteria infection 03/17/2015  . Diarrhea 03/03/2015  . Weakness 03/02/2015  . 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 Anxiety state 02/20/2015  . Claustrophobia 02/20/2015  . Transaminitis 02/18/2015  . Chest pain 02/18/2015  . Abdominal pain   . Dizziness   . Hypotension 01/04/2015  . Bipolar disorder ([place]) 06/29/2012    Hilda Blades, PT, DPT, LAT, ATC 08/15/20  3:31 PM Phone: [phone] Fax: [facility] [address] [place], [place], [address] Phone: [phone]   Fax:  [phone]  Name: Jordan [name] MRN: [id number] Date of Birth: [birth year]

## 2020-08-15 NOTE — Telephone Encounter (Signed)
Called back again about another missed call.  [name] Counseling Intern

## 2020-08-15 NOTE — Patient Instructions (Signed)
Access Code: [email or web address] URL: [email or web address] Date: 08/15/2020 Prepared by: Hilda Blades  Exercises Supine Single Leg Ankle Pumps - 3 x daily - 7 x weekly - 1 sets - 20 reps Supine Ankle Inversion Eversion AROM - 3 x daily - 7 x weekly - 1 sets - 20 reps Long Sitting Calf Stretch with Strap - 2 x daily - 7 x weekly - 2 sets - 30s hold Seated Ankle Plantar Flexion with Resistance Loop - 1 x daily - 7 x weekly - 2 sets - 20 reps Seated Ankle Eversion with Resistance - 1 x daily - 7 x weekly - 2 sets - 20 reps Ankle Inversion with Resistance - 1 x daily - 7 x weekly - 2 sets - 20 reps Seated Heel Toe Raises - 1 x daily - 7 x weekly - 2 sets - 20 reps Heel rises with counter support - 1 x daily - 7 x weekly - 2 sets - 10 reps Standing Gastroc Stretch at Counter - 1-2 x daily - 7 x weekly - 2 sets - 20 seconds hold

## 2020-08-15 NOTE — Telephone Encounter (Signed)
Called Jordan [name] and let her know the urine culture is back per her request and that we will fax the results to her urologist at [facility] once the susceptibility is back.  Also discussed that she should see her urologist to see if the UTI is causing her pain and bleeding per Joylene John, NP.  [name] verbalized understanding.

## 2020-08-16 ENCOUNTER — Telehealth: Payer: Self-pay | Admitting: Oncology

## 2020-08-16 LAB — URINE CULTURE: Culture: >=100000 — AB

## 2020-08-16 NOTE — Telephone Encounter (Signed)
[name] (daughter) left a message about urine culture results. Called her back and was not able to leave a message.

## 2020-08-16 NOTE — Telephone Encounter (Signed)
[name] called back and was advised of the urine culture results and that they have been faxed to [place] urologist at [facility].

## 2020-08-16 NOTE — Telephone Encounter (Signed)
Jordan [name] called and requested for her urine culture results to be faxed to her urologist's office at [place] results to [phone].

## 2020-08-17 ENCOUNTER — Telehealth: Payer: Self-pay

## 2020-08-17 NOTE — Telephone Encounter (Signed)
NO treatment for UC ED 08/13/20 per Annia Belt D

## 2020-08-18 ENCOUNTER — Telehealth: Payer: Self-pay | Admitting: Oncology

## 2020-08-18 NOTE — Progress Notes (Signed)
[facility] Counseling Note   Session began with patient updating counselor about the medical scare from the past week and her experience with medical professionals since then. Patient described that these professionals did not listen to her or give her the answer she wanted. Patient also discussed her experience of her two rounds with cancer, her relationship with her half-sister, and her frustration with her family. Patient presented with an irritated and frustrated mood, and an exaggerated affect. Patient was oriented times three and showed no signs of SI/HI/NSSI. Patient feels like she is not listened to in many areas of life, so when medical professionals do this, patient is reminded of her family. The lack of desired support from her family of origin has left patient feeling angry. Because her family supported her brother, she thinks they should be doing the same for her and she feels betrayed that they did not. Behind her anger, patient is hurt that she is not being cared for the way her brother was and how she feels she deserves. In the next session, counselor will try to discuss patient's experience of not being listened to, understood, and supported.    [name] Counseling Intern

## 2020-08-18 NOTE — Telephone Encounter (Signed)
Told Jordan [name] that Jordan Cross,NP and [name] reviewed the office note from the urologist in care everywhere.  She will follow up with you at her appointment scheduled for 09-11-20.  She is currently taking Oxycodone 10 mg for back and leg about twice a day. She is able to use the medication qid if needed. She can try 1/2 to one tablet more in a day to see if this helps with her pain. Pt verbalized understanding.

## 2020-08-18 NOTE — Telephone Encounter (Signed)
Jordan [name] called and said she is having more pain like she did when she finished radiation.  She said her urologist recommended her to see [name] to evaluate for cancer recurrence.  Advised her that she has an appointment scheduled with [name] on 09/11/20.  She is wondering if [name] is in the office today.  Advised her she will need to call Dr. Serita Grit office to find that out. She verbalize understanding and will call Dr. Serita Grit office.

## 2020-08-21 ENCOUNTER — Telehealth: Payer: Self-pay | Admitting: Oncology

## 2020-08-21 ENCOUNTER — Other Ambulatory Visit: Payer: Self-pay

## 2020-08-21 ENCOUNTER — Ambulatory Visit
Admission: RE | Admit: 2020-08-21 | Discharge: 2020-08-21 | Disposition: A | Discharge status: Home / Self Care | Payer: Medicaid Other | Source: Ambulatory Visit | Attending: Nurse Practitioner | Admitting: Nurse Practitioner

## 2020-08-21 DIAGNOSIS — Z1231 Encounter for screening mammogram for malignant neoplasm of breast: Secondary | ICD-10-CM

## 2020-08-21 NOTE — Telephone Encounter (Signed)
Jordan [name] left 2 message this morning.  Called her back and she said she is doing ok today and has her mammogram scheduled for this afternoon.  Also advised her that I mailed her appointment print out to her on Friday.

## 2020-08-22 ENCOUNTER — Telehealth: Payer: Self-pay | Admitting: Oncology

## 2020-08-22 ENCOUNTER — Other Ambulatory Visit: Payer: Self-pay

## 2020-08-22 ENCOUNTER — Ambulatory Visit: Payer: Medicaid Other | Attending: Student | Admitting: Physical Therapy

## 2020-08-22 ENCOUNTER — Encounter: Payer: Self-pay | Admitting: Physical Therapy

## 2020-08-22 DIAGNOSIS — M25571 Pain in right ankle and joints of right foot: Secondary | ICD-10-CM

## 2020-08-22 DIAGNOSIS — R262 Difficulty in walking, not elsewhere classified: Secondary | ICD-10-CM | POA: Diagnosis present

## 2020-08-22 DIAGNOSIS — Z8781 Personal history of (healed) traumatic fracture: Secondary | ICD-10-CM | POA: Diagnosis present

## 2020-08-22 DIAGNOSIS — R600 Localized edema: Secondary | ICD-10-CM | POA: Diagnosis present

## 2020-08-22 DIAGNOSIS — Z9889 Other specified postprocedural states: Secondary | ICD-10-CM | POA: Insufficient documentation

## 2020-08-22 NOTE — Telephone Encounter (Signed)
Jordan [name] called and said her urologist has scheduled a CT scan at [facility] for 08/31/20.  She is concerned about it being approved by Medicaid. She continues to have pain and is seeing red blood occasionally when she wipes and also when she urinates.  She has  increased her pain medication but said it is not helping.  Advised her to call Dr. Serita Grit office about the pain and that they will review her CT results and work her in sooner than 11/22 if necessary.

## 2020-08-22 NOTE — Therapy (Signed)
[place], [place], [address] Phone: [phone]   Fax:  [phone]  Physical Therapy Treatment/ERO  Patient Details  Name: Jordan [name] MRN: [id number] Date of Birth: [birth year] Referring Provider (PT): Delray Alt, PA-C   Encounter Date: 08/22/2020   PT End of Session - 08/22/20 1551    Visit Number 4    Number of Visits 4    Date for PT Re-Evaluation 10/17/20    Authorization Type MCD- reauth submitted 11/2    Authorization Time Period 08/07/2020 - 08/27/2020    Authorization - Visit Number 3    Authorization - Number of Visits 3    PT Start Time 2992    PT Stop Time 1627    PT Time Calculation (min) 42 min    Activity Tolerance Patient tolerated treatment well    Behavior During Therapy [facility] for tasks assessed/performed           Past Medical History:  Diagnosis Date  . Alcohol abuse   . Anemia    patient denies  . Bipolar 1 disorder (HCC)    No medications currently  . CAP (community acquired pneumonia) 03/17/2015  . 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 Mental disorder   . Obesity   . PICC line infection 05/17/2015  . Sepsis due to Gram negative bacteria (MDR E Coli) 02/18/2015  . 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 UTI (lower urinary tract infection)   . Vaginal Pap smear, abnormal     Past Surgical History:  Procedure Laterality Date  . ABDOMINAL HYSTERECTOMY    . CERVICAL CONIZATION W/BX N/A 07/01/2016   Procedure: CONIZATION CERVIX WITH BIOPSY;  Surgeon: Chancy Milroy, MD;  Location: [facility];  Service: Gynecology;  Laterality: N/A;  . CHOLECYSTECTOMY    . EXTERNAL FIXATION LEG Right 04/09/2020   Procedure: EXTERNAL FIXATION LEG;  Surgeon: Hiram Gash, MD;  Location: [facility];  Service: Orthopedics;  Laterality: Right;  . HYSTEROSCOPY WITH D & C N/A 01/25/2016   Procedure: DILATATION AND CURETTAGE /HYSTEROSCOPY;  Surgeon: Mora Bellman, MD;   Location: [facility];  Service: Gynecology;  Laterality: N/A;  . LYMPH NODE BIOPSY Bilateral 07/22/2019   Procedure: LYMPH NODE BIOPSY;  Surgeon: Everitt Amber, MD;  Location: [facility];  Service: Gynecology;  Laterality: Bilateral;  . OPEN REDUCTION INTERNAL FIXATION (ORIF) TIBIA/FIBULA FRACTURE Right 04/10/2020   Procedure: OPEN REDUCTION INTERNAL FIXATION (ORIF) TIBIA/FIBULA FRACTURE;  Surgeon: Shona Needles, MD;  Location: [facility];  Service: Orthopedics;  Laterality: Right;  . ROBOT ASSISTED MYOMECTOMY N/A 07/22/2019   Procedure: XI ROBOTIC ASSISTED LAPAROSCOPIC RADICAL UPPER VAGINECTOMY, LEFT SALPINECTOMY, RIGHT SALPINGOOOPHERECTOMY;  Surgeon: Everitt Amber, MD;  Location: [facility];  Service: Gynecology;  Laterality: N/A;  . VAGINAL HYSTERECTOMY N/A 03/11/2017   Procedure: HYSTERECTOMY VAGINAL WITH MORCELLATION;  Surgeon: Chancy Milroy, MD;  Location: [facility];  Service: Gynecology;  Laterality: N/A;    There were no vitals filed for this visit.   Subjective Assessment - 08/22/20 1551    Subjective took pain meds at about 1 and then got up and moved around right away instead of letting it set up. No longer wearing boot. It still hurts. reports compliance with HEP.    Patient Stated Goals decrease pain, get out boot, walk without AD (PLOF)    Currently in Pain? Yes    Pain Score 9     Pain Location Leg    Pain  Orientation Right;Lower    Pain Descriptors / Indicators Aching;Sore    Aggravating Factors  somewhat worse with walking    Pain Relieving Factors pain meds              [facility] PT Assessment - 08/22/20 0001      Assessment   Medical Diagnosis s/p Rt tib-fib ORIF    Referring Provider (PT) Delray Alt, PA-C    Onset Date/Surgical Date 04/10/20    Hand Dominance Left      Precautions   Precautions None      Restrictions   Weight Bearing Restrictions No      Balance Screen   Has the patient fallen in the past 6 months No      [place]  residence    Living Arrangements Alone    Additional Comments stairs in and out of home but use back door where there is just a big hump      Prior Function   Level of Independence Independent    Vocation On disability      Observation/Other Assessments   Focus on Therapeutic Outcomes (FOTO)  n/a MCD      ROM / Strength   AROM / PROM / Strength PROM      PROM   PROM Assessment Site Ankle    Right/Left Ankle Right    Right Ankle Dorsiflexion 10      Palpation   Palpation comment good healing of incision sites, mild edema in lower leg, pt denies TTP       Ambulation/Gait   Gait Comments using rollator, antalgic, flat foot strike, in bil tennis shoes                         [facility] Adult PT Treatment/Exercise - 08/22/20 0001      Ankle Exercises: Supine   T-Band 4-way with red tband      Ankle Exercises: Seated   Other Seated Ankle Exercises towel slides- encouraging large ROM    Other Seated Ankle Exercises toe yoga                  PT Education - 08/22/20 1742    Education Details goals, POC, pain progression, rollator safety    Person(s) Educated Patient    Methods Explanation;Demonstration;Tactile cues;Verbal cues    Comprehension Verbalized understanding;Returned demonstration;Verbal cues required;Tactile cues required;Need further instruction            PT Short Term Goals - 08/22/20 1554      PT SHORT TERM GOAL #1   Title ankle DF to 5 deg      PT SHORT TERM GOAL #2   Title pt will demo full weight acceptance into Rt foot in static stance    Baseline able to demo full weight acceptance    Status Achieved             PT Long Term Goals - 08/22/20 1740      PT LONG TERM GOAL #1   Title pt will be able to ambulate safely around the clinic without AD    Baseline full time use of rollator at eval    Time 8   POC 6 weeks, incr time to accommodate MCD auth   Period Weeks    Status New    Target Date 10/17/20      PT LONG TERM  GOAL #2   Title pain with ADLs <=  2/10    Baseline 3/10 at rest using boot and AD at eval    Time 8    Period Weeks    Status New    Target Date 10/17/20      PT LONG TERM GOAL #3   Title pt will ambulate in the community using LRAD    Baseline rollator at eval, PLOF was without AD, will determine need for AD based on balance and safety    Time 8    Period Weeks    Status New    Target Date 10/17/20      PT LONG TERM GOAL #4   Title pt will demo SLS comfortably for at least 5s without UE assist to demo full weight acceptance as well as balance control for safety    Baseline able to accept weight on Rt LE today but unable to demo controlled SLS    Time 8    Period Weeks    Status New    Target Date 10/17/20                 Plan - 08/22/20 1737    Clinical Impression Statement Pt presents to PT for re-evaluation of Rt leg pain, weakness and difficulty walking. Ankle ROM is significantly improved from evaluation and is able to ambulate without boot. Pt reports 9/10 pain but does not show visual signs of pain with motion testing or palpation. Antalgic gait when ambulating but no visual signs of pain in expressions. She continues to say that she will always have pain because her PCP told her she will and we discussed how sensation can be different but overall she should not expect to be in life-limiting pain for the rest of her life. Assisted pt in contacting [facility] to obtain a new rollater as her current rollator has a broken wheel and is a trip hazzard. Pt will continue to benefit from skilled PT in order to achieve long term stated functional goals.    Personal Factors and Comorbidities Fitness;Time since onset of injury/illness/exacerbation;Comorbidity 1;Education    Comorbidities mental disorder    Examination-Activity Limitations Sit;Squat;Stairs;Stand;Transfers;Locomotion Level    Examination-Participation Restrictions Community Activity;Other    PT Frequency 2x /  week    PT Duration 6 weeks   within MCD auth dates   PT Treatment/Interventions ADLs/Self Care Home Management;Cryotherapy;Electrical Stimulation;Gait training;Neuromuscular re-education;Therapeutic exercise;Therapeutic activities;Functional mobility training;Stair training;Patient/family education;Manual techniques;Taping;Passive range of motion;Scar mobilization;Vasopneumatic Device    PT Next Visit Plan Progress ankle motion and strength, progress banded [place] strengthening, progress to standing strengthening PRN, gait training in // bars without AD    PT Home Exercise Plan [id number]    Consulted and Agree with Plan of Care Patient           Patient will benefit from skilled therapeutic intervention in order to improve the following deficits and impairments:  Abnormal gait, Decreased range of motion, Difficulty walking, Decreased activity tolerance, Pain, Improper body mechanics, Impaired flexibility, Decreased strength  Visit Diagnosis: Status post open reduction with internal fixation (ORIF) of fracture of ankle - Plan: PT plan of care cert/re-cert  Pain in right ankle and joints of right foot - Plan: PT plan of care cert/re-cert  Difficulty in walking, not elsewhere classified - Plan: PT plan of care cert/re-cert  Localized edema - Plan: PT plan of care cert/re-cert     Problem List Patient Active Problem List   Diagnosis Date Noted  . Acute blood loss anemia   . Leukopenia   .  Vitamin D deficiency   . Acute lower UTI   . Postoperative pain   . Tibia/fibula fracture, right, sequela 04/15/2020  . Closed fracture of medial malleolus of right ankle 04/10/2020  . Open fracture of distal end of fibula and tibia, right, type I or II, initial encounter 04/09/2020  . Vaginal cancer ([place]) 07/22/2019  . Morbid obesity with BMI of 40.0-44.9, adult ([place]) 06/11/2019  . 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 Visit for routine gyn exam 04/21/2019  . Vaginal atrophy  04/21/2019  . Painful lumpy right breast 07/27/2016  . Urinary tract infection 07/01/2015  . Diastolic dysfunction 90/24/0973  . Constipation 05/12/2015  . ESBL (extended spectrum beta-lactamase) producing bacteria infection 03/17/2015  . Diarrhea 03/03/2015  . Weakness 03/02/2015  . 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 Anxiety state 02/20/2015  . Claustrophobia 02/20/2015  . Transaminitis 02/18/2015  . Chest pain 02/18/2015  . Abdominal pain   . Dizziness   . Hypotension 01/04/2015  . Bipolar disorder ([place]) 06/29/2012    Harpreet Signore C. Yona Kosek PT, DPT 08/22/20 5:50 PM   [facility] [address] [place], [place], [address] Phone: [phone]   Fax:  [phone]  Name: Jordan [name] MRN: [id number] Date of Birth: August 26, 1966

## 2020-08-23 ENCOUNTER — Telehealth: Payer: Self-pay

## 2020-08-23 ENCOUNTER — Telehealth: Payer: Self-pay | Admitting: Oncology

## 2020-08-23 NOTE — Telephone Encounter (Signed)
Jordan [name] Nurse Navigator spoke with patient and gave Dr. Serita Grit recommendations as Jordan [name] had called her bout the pain.

## 2020-08-23 NOTE — Telephone Encounter (Signed)
Jordan [name] states that she has been having pain in her vagina for >2 weeks.  She has not had any vaginal bleeding at least in the last 3 days. She has had some blood on the tissue after urinating.  Pt is currently being treated for a UTI. She has a CT urogram scheduled for 08-31-20 at [facility]. She has a follow up with Dr. Amalia Hailey on 09-05-20 and [name] on 09-11-20 Pain is 10/10. Pain is sharp,throbbing, and constant. She has used the oxycodone 10 mg tablets twice in a day and it does not help the vaginal pain. Told Jordan [name] that if the pain is unbearable that she can go to the ED to be be evaluated. Will give this information to [name] and Joylene John, NP to review and call her back later as they are in the OR today.

## 2020-08-23 NOTE — Telephone Encounter (Signed)
Jordan [name] called and said the pain is really bad when she urinates or sits down.  Advised her that [name] would like to review the CT scan scheduled for 08/31/20 and that her appointment will be moved up if needed.  Discussed that she should go to the ER if her pain is unbearable and nothing is helping.  She said she would go to the ER but is worried she will have to wait in the waiting room sitting up which is very painful.  Encouraged her to try to go.

## 2020-08-25 ENCOUNTER — Telehealth: Payer: Self-pay | Admitting: *Deleted

## 2020-08-25 NOTE — Progress Notes (Signed)
[place] Patient and [facility] Counseling Note   Patient arrived to session ready to talk about her experience with some doctors in the past week, where their encounters make her feel stupid. Patient and counselor explored more around what comes up for her when people treat her that way and what she then thinks of them. Counselor reflected that when others treat her poorly, the patient feels she deserves it and patient agreed. Patient used a Product/process development scientist of being trapped alone in the dark, with some helpful voices calling out to her. Patient also discussed boyfriend, daughter and her worries about them. Lastly, the patient also told counselor about how her boyfriend and daughter do not get along. Patient was oriented times three in session and showed no signs of HI/SI/NSSI. The patient had an exaggerated, labile affect with hurt and angry mood. The patient is incredibly hurt because she feels others think she is stupid, which might mean that she is (in her mind). Patient might have a core belief of "I'm stupid," so whenever she comes across information that reinforces that thought, it triggers anger and hurt. Next session will continue to explore this thought, her close relationships and journaling.    [name] Counseling Intern

## 2020-08-25 NOTE — Telephone Encounter (Signed)
Per patient request, mailed calendar to the patient

## 2020-08-30 ENCOUNTER — Telehealth: Payer: Self-pay | Admitting: Oncology

## 2020-08-30 NOTE — Telephone Encounter (Signed)
Jordan [name] called twice.  She asked if she qualifies for the [employer],  She said it has been more than 6 months since her second vaccination. Advised her that she can contact her PCP to make sure she qualifies.

## 2020-09-01 ENCOUNTER — Other Ambulatory Visit: Payer: Self-pay | Admitting: Gynecologic Oncology

## 2020-09-01 ENCOUNTER — Telehealth: Payer: Self-pay

## 2020-09-01 ENCOUNTER — Ambulatory Visit
Admission: RE | Admit: 2020-09-01 | Discharge: 2020-09-01 | Disposition: A | Discharge status: Home / Self Care | Payer: Self-pay | Source: Ambulatory Visit | Attending: Gynecologic Oncology | Admitting: Gynecologic Oncology

## 2020-09-01 ENCOUNTER — Telehealth: Payer: Self-pay | Admitting: Oncology

## 2020-09-01 DIAGNOSIS — C52 Malignant neoplasm of vagina: Secondary | ICD-10-CM

## 2020-09-01 DIAGNOSIS — R591 Generalized enlarged lymph nodes: Secondary | ICD-10-CM

## 2020-09-01 NOTE — Telephone Encounter (Signed)
Called Jordan [name] and discussed her CT results from yesterday.  Advised her that [name] is ordering a PET scan to further evaluate the enlarged left pelvic lymph node that was seen on the CT.  Advised her that the PET scan has been scheduled for 09/15/20 and that we will reschedule Dr. Serita Grit follow up appointment after the scan.  I will give her an updated calender when she is here today for counseling.  She verbalized understanding and agreement.

## 2020-09-01 NOTE — Telephone Encounter (Signed)
Called to confirm today's appointment.   [name] Counseling Intern

## 2020-09-01 NOTE — Progress Notes (Signed)
Patient had recent CT imaging at [facility] that showed an enlarged pelvic lymph node.  Given history of vaginal cancer, plan for PET scan to evaluate this lymph node and for signs of metastatic disease.

## 2020-09-01 NOTE — Progress Notes (Signed)
Jordan [name] Patient and [facility] Counseling Note   Session began with patient informing counselor she was not seeing the counselor through her PCP again. The main topic for session was patient's potential cancer again. It is not confirmed yet, but she had a CT scan that did not look good. Patient described many feelings, such as anger and confusion, that were coming up for her. Patient is left with the question of "where do I go from here?" Counselor introduced discussion of values, which the patient found hard to narrow down to a few. Patient was oriented times three in session. She had an angry mood, with some hidden sadness. Her affect was labile and she showed no signs of SI/HI/NSSI. Patient checked or answered ehr phone multiple times during session. Patient spoke less than usual, but still a lot. She also cried when asking "Why me?" The possibility of cancer returning has thrown the patient for a loop and brought up anger, fear, sadness, worry, confusion, and more. This combination of feelings is a lot for the patient to process, so she has some emotional outbursts. Next session will focus on checking in on the cancer, discussions with her sister, and further emotional exploration.    [name] Counseling Intern

## 2020-09-04 ENCOUNTER — Telehealth: Payer: Self-pay | Admitting: Nurse Practitioner

## 2020-09-04 ENCOUNTER — Telehealth: Payer: Self-pay | Admitting: Oncology

## 2020-09-04 NOTE — Telephone Encounter (Signed)
Jordan [name] called and said Medicaid transportation is not available on 09/15/20 and is wondering if she can use [facility] transportation.  Advised her that I will arrange for the transportation and call her back once it is set up.

## 2020-09-04 NOTE — Telephone Encounter (Signed)
Left a message advising that transportation has been arranged for 09/15/20 appointment.

## 2020-09-05 ENCOUNTER — Telehealth: Payer: Self-pay

## 2020-09-05 NOTE — Telephone Encounter (Signed)
Patient called to discuss younger brother going back into nursing home. Discussed family stress and feelings. Patient was shouting and in distress. Counselor provided listening and reflection. Planned to discuss more on Friday.  [name] Counseling Intern

## 2020-09-06 ENCOUNTER — Encounter: Payer: Self-pay | Admitting: General Practice

## 2020-09-06 ENCOUNTER — Telehealth: Payer: Self-pay | Admitting: Oncology

## 2020-09-06 ENCOUNTER — Other Ambulatory Visit: Payer: Self-pay

## 2020-09-06 ENCOUNTER — Encounter: Payer: Self-pay | Admitting: Physical Therapy

## 2020-09-06 ENCOUNTER — Ambulatory Visit: Payer: Medicaid Other | Admitting: Physical Therapy

## 2020-09-06 DIAGNOSIS — Z9889 Other specified postprocedural states: Secondary | ICD-10-CM | POA: Diagnosis not present

## 2020-09-06 DIAGNOSIS — R600 Localized edema: Secondary | ICD-10-CM

## 2020-09-06 DIAGNOSIS — M25571 Pain in right ankle and joints of right foot: Secondary | ICD-10-CM

## 2020-09-06 DIAGNOSIS — R262 Difficulty in walking, not elsewhere classified: Secondary | ICD-10-CM

## 2020-09-06 DIAGNOSIS — Z8781 Personal history of (healed) traumatic fracture: Secondary | ICD-10-CM

## 2020-09-06 NOTE — Therapy (Signed)
[place], [place], [address] Phone: [phone]   Fax:  [phone]  Physical Therapy Treatment  Patient Details  Name: Jordan [name] MRN: [id number] Date of Birth: April 29, 1966 Referring Provider (PT): Delray Alt, PA-C   Encounter Date: 09/06/2020   PT End of Session - 09/06/20 1540    Visit Number 5    Number of Visits 16    Date for PT Re-Evaluation 10/17/20    Authorization Type MCD    Authorization Time Period 09/06/2020 = 10/17/2020    Authorization - Visit Number 1    Authorization - Number of Visits 12    PT Start Time 2952    PT Stop Time 1615    PT Time Calculation (min) 42 min    Activity Tolerance Patient tolerated treatment well    Behavior During Therapy [facility] for tasks assessed/performed           Past Medical History:  Diagnosis Date  . Alcohol abuse   . Anemia    patient denies  . Bipolar 1 disorder (HCC)    No medications currently  . CAP (community acquired pneumonia) 03/17/2015  . 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 Mental disorder   . Obesity   . PICC line infection 05/17/2015  . Sepsis due to Gram negative bacteria (MDR E Coli) 02/18/2015  . 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 UTI (lower urinary tract infection)   . Vaginal Pap smear, abnormal     Past Surgical History:  Procedure Laterality Date  . ABDOMINAL HYSTERECTOMY    . CERVICAL CONIZATION W/BX N/A 07/01/2016   Procedure: CONIZATION CERVIX WITH BIOPSY;  Surgeon: Chancy Milroy, MD;  Location: [facility];  Service: Gynecology;  Laterality: N/A;  . CHOLECYSTECTOMY    . EXTERNAL FIXATION LEG Right 04/09/2020   Procedure: EXTERNAL FIXATION LEG;  Surgeon: Hiram Gash, MD;  Location: [facility];  Service: Orthopedics;  Laterality: Right;  . HYSTEROSCOPY WITH D & C N/A 01/25/2016   Procedure: DILATATION AND CURETTAGE /HYSTEROSCOPY;  Surgeon: Mora Bellman, MD;  Location: [facility];  Service:  Gynecology;  Laterality: N/A;  . LYMPH NODE BIOPSY Bilateral 07/22/2019   Procedure: LYMPH NODE BIOPSY;  Surgeon: Everitt Amber, MD;  Location: [facility];  Service: Gynecology;  Laterality: Bilateral;  . OPEN REDUCTION INTERNAL FIXATION (ORIF) TIBIA/FIBULA FRACTURE Right 04/10/2020   Procedure: OPEN REDUCTION INTERNAL FIXATION (ORIF) TIBIA/FIBULA FRACTURE;  Surgeon: Shona Needles, MD;  Location: [facility];  Service: Orthopedics;  Laterality: Right;  . ROBOT ASSISTED MYOMECTOMY N/A 07/22/2019   Procedure: XI ROBOTIC ASSISTED LAPAROSCOPIC RADICAL UPPER VAGINECTOMY, LEFT SALPINECTOMY, RIGHT SALPINGOOOPHERECTOMY;  Surgeon: Everitt Amber, MD;  Location: [facility];  Service: Gynecology;  Laterality: N/A;  . VAGINAL HYSTERECTOMY N/A 03/11/2017   Procedure: HYSTERECTOMY VAGINAL WITH MORCELLATION;  Surgeon: Chancy Milroy, MD;  Location: [facility];  Service: Gynecology;  Laterality: N/A;    There were no vitals filed for this visit.   Subjective Assessment - 09/06/20 1538    Subjective Patient reports leg/ankle still hurts. She does the exercises at home.    Patient Stated Goals decrease pain, get out boot, walk without AD (PLOF)    Currently in Pain? Yes    Pain Score 9     Pain Location Leg    Pain Orientation Right;Lower    Pain Descriptors / Indicators Aching    Pain Type Chronic pain    Pain Onset More than  a month ago    Pain Frequency Constant              [facility] PT Assessment - 09/06/20 0001      Assessment   Medical Diagnosis s/p Rt tib-fib ORIF    Referring Provider (PT) Delray Alt, PA-C    Onset Date/Surgical Date 04/10/20      AROM   Right Ankle Dorsiflexion 10                         [facility] Adult PT Treatment/Exercise - 09/06/20 0001      Neuro Re-ed    Neuro Re-ed Details  Modified 1/2 tandem stance 2 x 20 sec each, 3/4 tandem x 20 sec each      Exercises   Exercises Ankle      Ankle Exercises: Stretches   Slant Board Stretch 3 reps;30 seconds      Ankle  Exercises: Aerobic   Nustep L5 x 5 min with UE and LE for ankle motion and BLE endurance      Ankle Exercises: Seated   Heel Raises 20 reps   2 sets   Toe Raise 20 reps   2 sets     Ankle Exercises: Supine   T-Band 4-way with red band 2 x 20 each   longsitting                 PT Education - 09/06/20 1539    Education Details HEP    Person(s) Educated Patient    Methods Explanation;Demonstration;Verbal cues    Comprehension Verbalized understanding;Returned demonstration;Need further instruction;Verbal cues required            PT Short Term Goals - 08/22/20 1554      PT SHORT TERM GOAL #1   Title ankle DF to 5 deg      PT SHORT TERM GOAL #2   Title pt will demo full weight acceptance into Rt foot in static stance    Baseline able to demo full weight acceptance    Status Achieved             PT Long Term Goals - 08/22/20 1740      PT LONG TERM GOAL #1   Title pt will be able to ambulate safely around the clinic without AD    Baseline full time use of rollator at eval    Time 8   POC 6 weeks, incr time to accommodate MCD auth   Period Weeks    Status New    Target Date 10/17/20      PT LONG TERM GOAL #2   Title pain with ADLs <=2/10    Baseline 3/10 at rest using boot and AD at eval    Time 8    Period Weeks    Status New    Target Date 10/17/20      PT LONG TERM GOAL #3   Title pt will ambulate in the community using LRAD    Baseline rollator at eval, PLOF was without AD, will determine need for AD based on balance and safety    Time 8    Period Weeks    Status New    Target Date 10/17/20      PT LONG TERM GOAL #4   Title pt will demo SLS comfortably for at least 5s without UE assist to demo full weight acceptance as well as balance control for safety    Baseline able  to accept weight on Rt LE today but unable to demo controlled SLS    Time 8    Period Weeks    Status New    Target Date 10/17/20                 Plan - 09/06/20 1541     Clinical Impression Statement Patient tolerated therapy well with no adverse effects. She continues to reports high pain levels but is able to participate in therapy without issues. Continued to work on ankle mobility and strength to improve walking ability. She would benefit from continued skilled PT to progress her motion and strength in order to reduce pain and improve walking ability to maximize function.    PT Treatment/Interventions ADLs/Self Care Home Management;Cryotherapy;Electrical Stimulation;Gait training;Neuromuscular re-education;Therapeutic exercise;Therapeutic activities;Functional mobility training;Stair training;Patient/family education;Manual techniques;Taping;Passive range of motion;Scar mobilization;Vasopneumatic Device    PT Next Visit Plan Progress ankle motion and strength, progress banded [place]C strengthening, progress to standing strengthening PRN, gait training in // bars without AD    PT Home Exercise Plan [id number]    Consulted and Agree with Plan of Care Patient           Patient will benefit from skilled therapeutic intervention in order to improve the following deficits and impairments:  Abnormal gait, Decreased range of motion, Difficulty walking, Decreased activity tolerance, Pain, Improper body mechanics, Impaired flexibility, Decreased strength  Visit Diagnosis: Status post open reduction with internal fixation (ORIF) of fracture of ankle  Pain in right ankle and joints of right foot  Difficulty in walking, not elsewhere classified  Localized edema     Problem List Patient Active Problem List   Diagnosis Date Noted  . Acute blood loss anemia   . Leukopenia   . Vitamin D deficiency   . Acute lower UTI   . Postoperative pain   . Tibia/fibula fracture, right, sequela 04/15/2020  . Closed fracture of medial malleolus of right ankle 04/10/2020  . Open fracture of distal end of fibula and tibia, right, type I or II, initial encounter 04/09/2020  .  Vaginal cancer ([place]) 07/22/2019  . Morbid obesity with BMI of 40.0-44.9, adult ([place]) 06/11/2019  . 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 Visit for routine gyn exam 04/21/2019  . Vaginal atrophy 04/21/2019  . Painful lumpy right breast 07/27/2016  . Urinary tract infection 07/01/2015  . Diastolic dysfunction 54/06/8118  . Constipation 05/12/2015  . ESBL (extended spectrum beta-lactamase) producing bacteria infection 03/17/2015  . Diarrhea 03/03/2015  . Weakness 03/02/2015  . 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 Anxiety state 02/20/2015  . Claustrophobia 02/20/2015  . Transaminitis 02/18/2015  . Chest pain 02/18/2015  . Abdominal pain   . Dizziness   . Hypotension 01/04/2015  . Bipolar disorder ([place]) 06/29/2012    Hilda Blades, PT, DPT, LAT, ATC 09/06/20  4:17 PM Phone: [phone] Fax: [facility] [address] [place], [place], [address] Phone: [phone]   Fax:  [phone]  Name: Jordan [name] MRN: [id number] Date of Birth: Apr 21, 1966

## 2020-09-06 NOTE — Telephone Encounter (Signed)
Jordan [name] had left a message to call her back today.  She said she had a lot of pain yesterday with her procedure at the Urologist.  She said it is better now.  He is recommending that she have a biopsy of her bladder and she is worried and frustrated because she does not have any one to stay with her after the procedure.  Advised her to try to arrange something with the facility where she will be having the procedure.

## 2020-09-06 NOTE — Patient Instructions (Signed)
Access Code: [email or web address] URL: [email or web address] Date: 09/06/2020 Prepared by: Hilda Blades  Exercises Supine Single Leg Ankle Pumps - 3 x daily - 7 x weekly - 1 sets - 20 reps Supine Ankle Inversion Eversion AROM - 3 x daily - 7 x weekly - 1 sets - 20 reps Long Sitting Calf Stretch with Strap - 2 x daily - 7 x weekly - 2 sets - 30s hold Seated Ankle Plantar Flexion with Resistance Loop - 1 x daily - 7 x weekly - 2 sets - 20 reps Seated Ankle Eversion with Resistance - 1 x daily - 7 x weekly - 2 sets - 20 reps Ankle Inversion with Resistance - 1 x daily - 7 x weekly - 2 sets - 20 reps Seated Heel Toe Raises - 1 x daily - 7 x weekly - 2 sets - 20 reps Heel rises with counter support - 1 x daily - 7 x weekly - 2 sets - 10 reps Standing Gastroc Stretch at Counter - 1-2 x daily - 7 x weekly - 2 sets - 20 seconds hold

## 2020-09-06 NOTE — Progress Notes (Signed)
[name] Spiritual Care Note  Received several calls from [place] and spoke by phone this morning. She is processing her feelings about her upcoming PET scan and worry that her recurrent UTI may actually be a return of her cancer. She is also processing her ongoing grief at lack of family acknowledgment of her suffering and health risks; she feels little support, except from her daughter [name] who contacts her regularly, helps her review her MyChart reports, and overall brings joy to her life.  Jordan [name] emphasizes that she very much appreciates when providers "take the time to explain things in a way that I can understand." This helps her feel safe, valued, and cared for, and helps her be an informed consumer of her healthcare. She particularly values how her [facility] care team takes time to listen and to explain.  Jordan [name] also verbalized self-awareness about her communication style: She notes that increasing her volume is a way of showing people how she is feeling and what she thinks of their behavior, and that "talking a lot" is her way of processing so that she can "come back down" from an emotionally escalated state.  Jordan [name] reaches out regularly by phone.   [place], [place], [facility] Pager [phone] Voicemail [phone]

## 2020-09-08 NOTE — Progress Notes (Signed)
[place] Patient and [place] Counseling Note    Session covered topics largely regarding doctors and family dynamics. Patient does not like or understand the way her siblings are caring for the their drug addict brother. Patient feels angry, mad, and frustrated, and wonders if doctors "even care." Counselor introduced I statements, which patient expressed interest in trying when doctors do not explain things on a level she can understand. Patient is also concerned about the possibility of cancer returning and described the experience as "hell." Patient was oriented times three and showed no signs of HI/SI/NSSI. Her mood was hurt and annoyed with a labile affect. Patient was calmer than usual, though. Patient is worried about her cancer possibly returning and is distressed about that alongside her family concerns. Additionally, patient thinks her doctors treat her like she is stupid, which makes her sad and react by lashing out. Next session will check in on her cancer status and continue to provide affirming care.     [name] Counseling Intern

## 2020-09-11 ENCOUNTER — Telehealth: Payer: Self-pay | Admitting: Oncology

## 2020-09-11 ENCOUNTER — Telehealth: Payer: Self-pay

## 2020-09-11 ENCOUNTER — Encounter: Payer: Medicaid Other | Admitting: Physical Therapy

## 2020-09-11 ENCOUNTER — Encounter: Payer: Self-pay | Admitting: General Practice

## 2020-09-11 ENCOUNTER — Ambulatory Visit: Payer: Medicaid Other | Admitting: Gynecologic Oncology

## 2020-09-11 NOTE — Telephone Encounter (Signed)
Called to try to reschedule for next week.  [name] Counseling Intern

## 2020-09-11 NOTE — Progress Notes (Signed)
[place] Note  Received phone call from [place] for emotional support as she processed her symptoms and fear about potential results of upcoming scan and biopsy. She reports significant pain and bleeding that are not responding to the UTI treatment she is taking. Will report to gyn navigator Santiago Glad Hess/RN.   Aliviya needed to end the call quickly, so plans to follow up later in the week.   [place], [place], [facility] Pager [phone] Voicemail [phone]

## 2020-09-11 NOTE — Telephone Encounter (Signed)
Jordan [name] called back and discussed taking Keflex and reviewed her PET scan appointment for Friday.

## 2020-09-11 NOTE — Telephone Encounter (Signed)
Told Jordan [name] that she needs to pick up the keflex that her urologist prescribed today for and organism that grew on the urine culture from her procedure on 11-16.21. There can be bleeding,pain, and cramping with UTI. She needs to take her pain medication if needed.  She will have the Pet scan on 09-15-20 and see [name] on 09-19-20 to discuss results.  The Pet scan is a piece of the puzzle to figure out if symptoms are from bladder issues or recurrence of cancer.  Pt verbalized understanding.

## 2020-09-11 NOTE — Telephone Encounter (Signed)
Gregory left 2 messages this morning.  Called her back and left a VM message that I was returning her calls.

## 2020-09-12 ENCOUNTER — Encounter: Payer: Self-pay | Admitting: General Practice

## 2020-09-12 ENCOUNTER — Telehealth: Payer: Self-pay

## 2020-09-12 NOTE — Telephone Encounter (Signed)
Called to check on patient again and try to get her rescheduled.    [name] Counseling Intern

## 2020-09-12 NOTE — Progress Notes (Signed)
[facility] Spiritual Care Note  Received and returned voicemail from [place].   Chaplain Dontrez Pettis, MDiv, [facility] Pager [phone] Voicemail [phone] 

## 2020-09-12 NOTE — Telephone Encounter (Signed)
Scheduled session for 12/6.  [name] Counseling Intern

## 2020-09-13 ENCOUNTER — Encounter: Payer: Self-pay | Admitting: General Practice

## 2020-09-13 NOTE — Patient Instructions (Signed)
[place] Spiritual Care Note  Met with Jordan [name] for a one-hour appointment to provide continuity of emotional support in Counseling Intern Jearl Klinefelter absence. Jordan [name] shared and processed family concerns as well as a theme that was new in our conversations, her strong will to live (fighting cancer again if she must) and goals that are important to her, such as getting to visit her daughter Jordan [name] in [place] and her landlord in [place]. 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  We made a follow-up appointment for Tuesday, December 7 at 2:30. Provided a letter with this information for her Medicaid/SCAT ride.   Jordan [name], [place], [facility] Pager [phone] Voicemail [phone]

## 2020-09-15 ENCOUNTER — Ambulatory Visit (HOSPITAL_COMMUNITY)
Admission: RE | Admit: 2020-09-15 | Discharge: 2020-09-15 | Disposition: A | Discharge status: Home / Self Care | Payer: Medicaid Other | Source: Ambulatory Visit | Attending: Gynecologic Oncology | Admitting: Gynecologic Oncology

## 2020-09-15 ENCOUNTER — Other Ambulatory Visit: Payer: Self-pay

## 2020-09-15 DIAGNOSIS — R591 Generalized enlarged lymph nodes: Secondary | ICD-10-CM | POA: Diagnosis present

## 2020-09-15 DIAGNOSIS — C52 Malignant neoplasm of vagina: Secondary | ICD-10-CM | POA: Insufficient documentation

## 2020-09-15 LAB — GLUCOSE, CAPILLARY: Glucose-Capillary: 86 mg/dL (ref 70–99)

## 2020-09-15 MED ORDER — FLUDEOXYGLUCOSE F - 18 (FDG) INJECTION
16.0000 | Freq: Once | INTRAVENOUS | Status: AC | PRN
  Administered 2020-09-15: 16 via INTRAVENOUS

## 2020-09-18 ENCOUNTER — Encounter: Payer: Medicaid Other | Admitting: Physical Therapy

## 2020-09-18 ENCOUNTER — Encounter: Payer: Self-pay | Admitting: General Practice

## 2020-09-18 NOTE — Progress Notes (Signed)
[place] Spiritual Care Note  Received call from [place] and provided empathic, reflective listening as she processed recent family concerns and her feelings about waiting to hear the PET scan results. She plans to phone again as needed.   [place], [place], [facility] Pager [phone] Voicemail [phone]

## 2020-09-18 NOTE — Progress Notes (Signed)
Sure thing!

## 2020-09-19 ENCOUNTER — Inpatient Hospital Stay: Payer: Medicaid Other | Attending: Gynecologic Oncology | Admitting: Gynecologic Oncology

## 2020-09-19 ENCOUNTER — Other Ambulatory Visit: Payer: Self-pay

## 2020-09-19 ENCOUNTER — Encounter: Payer: Self-pay | Admitting: Gynecologic Oncology

## 2020-09-19 ENCOUNTER — Telehealth: Payer: Self-pay | Admitting: Oncology

## 2020-09-19 VITALS — BP 115/76 | HR 92 | Temp 97.2°F | Resp 20 | Ht 68.0 in | Wt 288.0 lb

## 2020-09-19 DIAGNOSIS — Z79899 Other long term (current) drug therapy: Secondary | ICD-10-CM | POA: Diagnosis not present

## 2020-09-19 DIAGNOSIS — R102 Pelvic and perineal pain: Secondary | ICD-10-CM | POA: Insufficient documentation

## 2020-09-19 DIAGNOSIS — F319 Bipolar disorder, unspecified: Secondary | ICD-10-CM | POA: Insufficient documentation

## 2020-09-19 DIAGNOSIS — Z90721 Acquired absence of ovaries, unilateral: Secondary | ICD-10-CM | POA: Diagnosis not present

## 2020-09-19 DIAGNOSIS — C52 Malignant neoplasm of vagina: Secondary | ICD-10-CM | POA: Diagnosis present

## 2020-09-19 DIAGNOSIS — Z923 Personal history of irradiation: Secondary | ICD-10-CM | POA: Insufficient documentation

## 2020-09-19 DIAGNOSIS — Z90710 Acquired absence of both cervix and uterus: Secondary | ICD-10-CM | POA: Insufficient documentation

## 2020-09-19 MED ORDER — IBUPROFEN 800 MG PO TABS
800.0000 mg | ORAL_TABLET | Freq: Three times a day (TID) | ORAL | 0 refills | Status: DC | PRN
Start: 2020-09-19 — End: 2020-12-19

## 2020-09-19 NOTE — Patient Instructions (Signed)
There is no sign of vaginal cancer on today's examination.  The PET scan showed no cancer, with the left sided pelvic mass being your left ovary which is a normal finding.  [name] recommends that you return to see Dr. Sondra Come in 3 months as scheduled.  She recommends that you return to see her in 6 months. Please have Dr Clabe Seal office contact Dr Serita Grit office (at [phone]) in February after your appointment with him to request an appointment with Dr Denman George for May, 2022.  [name] has prescribed you ibuprofen tablets for your vaginal pain.  She cannot prescribe opiate pain medication chronically.

## 2020-09-19 NOTE — Telephone Encounter (Signed)
Jordan [name] has called 4 times today. She is concerned because she is going to have a biopsy/surgery scheduled with the urology specialist.  She is trying to work with the office because she doesn't have anyone to stay with her after the procedure.  Actively listened and provided support for finding a solution so that she can have surgery. She is aware of her appointment with [name] this afternoon.

## 2020-09-19 NOTE — Progress Notes (Signed)
Follow-up Note: Gyn-Onc  Consult was requested by Dr. Rip Harbour for the evaluation of Jordan [name] 54 y.o. female  CC:  Chief Complaint  Patient presents with  . Vaginal cancer [facility])    Assessment/Plan:  Jordan [name]  is a 54 y.o.  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 S/p adjuvant vaginal brachytherapy for positive margins on specimen. Treatment completed December, 2021.  MRI pelvis w/wo contrast in February, 2022.   She will return to see me in 6 months and Dr Sondra Come in 3 months. We will continue 3 monthly surveillance for 2 years.  I recommend pap testing of the vagina annually in October.   She should have a follow-up MRI of the pelvis to evaluate the left adnexa. This will be scheduled at her next follow-up with me.  HPI: Jordan [name] is a 53 year old P1 who is seen in consultation at the request of Dr Rip Harbour for squamous cell carcinoma of the vulva.  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  A follow-up Pap smear in 2018 showed H SIL.  She then underwent a vaginal hysterectomy which confirmed residual CIN-3 with negative margins at the hysterectomy specimen.  She underwent a Pap test for surveillance in April 21, 2019 which revealed the AIN 2-3, H SIL.  Subsequently a vaginal colposcopy was performed on May 28, 2019.  No visible lesions were seen.  There was minimal acetowhite changes noted after acetic acid was applied that was not noted which location.  Biopsies were obtained from the vagina.  Final pathology revealed poorly differentiated squamous cell carcinoma.  The tumor cells were positive for CK 5 6, p63, and P 16.  The patient has a history of bipolar disorder.   On June 18, 2019 she underwent staging CT scan of the chest abdomen and pelvis.  This revealed that she was status post hysterectomy with no evidence of metastatic  disease in the abdomen or pelvis or chest.  There was a 6 mm right lower lobe that was grossly unchanged from a 2017 CT scan felt to be benign.  A compression fracture was seen at L2 unchanged from prior radiographs.  An MRI of the pelvis with IV contrast was performed on June 26, 2019.  It revealed susceptibility artifact in the vaginal cuff from suture material limiting evaluation.  However no focal soft tissue masses were seen involving the vaginal cuff and no abnormal soft tissue densities in the parametrial regions.  The right and left ovaries were grossly normal.  On July 22, 2019 she underwent a robotic assisted radical upper vaginectomy with bilateral pelvic sentinel lymph node biopsy, right salpingo-oophorectomy, left salpingectomy.  Intraoperative findings were significant for no grossly visible vaginal lesions.  The ovaries and fallopian tubes were adherent to the rectum and vaginal cuff in the obliterated cul-de-sac.  The ovaries themselves are grossly normal-appearing but densely adherent with no suspicious lymph nodes.  There is clinical stage I disease, microscopic.  Postoperatively final pathology returned as FIGO stage Ia microscopic poorly differentiated squamous cell carcinoma.  The tumor site was the posterior vaginal wall and measured 0.6 cm.  The posterior margin (the posterior vaginal cuff margin) was broadly positive for tumor cells.  The deep margin was negative.  There was 2.5 mm depth of invasion.  All sentinel lymph nodes were negative for metastases, as was the left fallopian tube  and right tube and ovary.  Due to positive margin status she was recommended adjuvant vaginal radiation postoperatively.  She completed adjuvant therapy with vaginal brachytherapy: Radiation Treatment Dates: 08/24/2019 through 09/22/2019 Site Technique Total Dose (Gy) Dose per Fx (Gy) Completed Fx Beam Energies  Pelvis: Pelvis_vagina HDR-brachy 30/30 6 5/5 Ir-192   Interval Hx:  PET/CT on  09/15/20 showed soft tissue attenuating structure is noted within the left pelvis measuring 1.7 cm. This was favored to be a left ovary. Recommended MRI in 3 months for follow-up. No clear evidence for recurrence.   She had been seen at [facility] for recurrent UTI's. A CT abd/pelvis was ordered which showed findings concerning for an enlarged pelvic lymph node. Follow-up PET on 09/15/20 showed a soft tissue structure noted within the left pelvis measuring 1.7 cm.  This is consistent with the left ovary.  It was not concerning for metastatic adenopathy.  They recommended follow-up MRI to confirm stability.  She reported being planned to undergo a surgical procedure at [facility] for her recurrent urinary tract infections but was not able to communicate with me what the surgical procedure would be. She returned today with complaints of vaginal pain.   Current Meds:  Outpatient Encounter Medications as of 09/19/2020  Medication Sig  . acetaminophen (TYLENOL) 325 MG tablet Take 1-2 tablets (325-650 mg total) by mouth every 4 (four) hours as needed for mild pain.  Marland Kitchen ALPRAZolam (XANAX) 0.25 MG tablet Take 1 tablet (0.25 mg total) by mouth 2 (two) times daily as needed for anxiety.  . cholecalciferol (VITAMIN D) 25 MCG tablet Take 1 tablet (1,000 Units total) by mouth daily.  . Ferrous Sulfate (IRON) 325 (65 Fe) MG TABS Take 1 tablet by mouth every other day.  . gabapentin (NEURONTIN) 100 MG capsule Take 1 capsule (100 mg total) by mouth 3 (three) times daily.  Marland Kitchen lurasidone (LATUDA) 20 MG TABS tablet Take 1 tablet (20 mg total) by mouth daily.  . naproxen (NAPROSYN) 250 MG tablet Take 1 tablet (250 mg total) by mouth 2 (two) times daily with a meal.  . oxybutynin (DITROPAN-XL) 10 MG 24 hr tablet Take 10 mg by mouth daily.  . Oxycodone HCl 10 MG TABS Take 1-1.5 tablets (10-15 mg total) by mouth 4 (four) times daily as needed.  Marland Kitchen ibuprofen (ADVIL) 800 MG tablet Take 1 tablet (800 mg total) by mouth  every 8 (eight) hours as needed.  . ondansetron (ZOFRAN) 4 MG tablet Take 1 tablet (4 mg total) by mouth every 6 (six) hours as needed for nausea. (Patient not taking: Reported on 09/19/2020)  . phenazopyridine (PYRIDIUM) 200 MG tablet Take 1 tablet by mouth 3 (three) times daily as needed. (Patient not taking: Reported on 09/19/2020)  . [DISCONTINUED] enoxaparin (LOVENOX) 40 MG/0.4ML injection Inject 0.4 mLs (40 mg total) into the skin daily. (Patient not taking: Reported on 07/25/2020)  . [DISCONTINUED] OXcarbazepine (TRILEPTAL) 150 MG tablet Take 0.5 tablets (75 mg total) by mouth in the morning and at bedtime. (Patient not taking: Reported on 07/25/2020)  . [DISCONTINUED] saccharomyces boulardii (FLORASTOR) 250 MG capsule Take 1 capsule (250 mg total) by mouth 2 (two) times daily. (Patient not taking: Reported on 07/25/2020)   No facility-administered encounter medications on file as of 09/19/2020.    Allergy:  Allergies  Allergen Reactions  . Ciprofloxacin Swelling    Lips swell, tongue swells, face swells  . Citalopram Other (See Comments)    Possible cause of pancytopenia. Swelling of tongue, face and  throat  . Lamotrigine Other (See Comments)    Possible cause of pancytopenia. Swelling of face, throat and tongue  . Sulfa Antibiotics Anaphylaxis  . Tramadol Anaphylaxis, Shortness Of Breath and Swelling    Social Hx:   Social History   Socioeconomic History  . Marital status: Soil scientist    Spouse name: Not on file  . Number of children: Not on file  . Years of education: Not on file  . Highest education level: Not on file  Occupational History  . Not on file  Tobacco Use  . Smoking status: Never Smoker  . Smokeless tobacco: Never Used  Vaping Use  . Vaping Use: Never used  Substance and Sexual Activity  . Alcohol use: Not Currently  . Drug use: No  . Sexual activity: Yes    Birth control/protection: Post-menopausal, Surgical    Comment: perimenopausal; no sex in  years  Other Topics Concern  . Not on file  Social History Narrative  . Not on file   Social Determinants of Health   Financial Resource Strain:   . Difficulty of Paying Living Expenses: Not on file  Food Insecurity:   . Worried About Charity fundraiser in the Last Year: Not on file  . Ran Out of Food in the Last Year: Not on file  Transportation Needs:   . Lack of Transportation (Medical): Not on file  . Lack of Transportation (Non-Medical): Not on file  Physical Activity:   . Days of Exercise per Week: Not on file  . Minutes of Exercise per Session: Not on file  Stress:   . Feeling of Stress : Not on file  Social Connections:   . Frequency of Communication with Friends and Family: Not on file  . Frequency of Social Gatherings with Friends and Family: Not on file  . Attends Religious Services: Not on file  . Active Member of Clubs or Organizations: Not on file  . Attends Archivist Meetings: Not on file  . Marital Status: Not on file  Intimate Partner Violence:   . Fear of Current or Ex-Partner: Not on file  . Emotionally Abused: Not on file  . Physically Abused: Not on file  . Sexually Abused: Not on file    Past Surgical Hx:  Past Surgical History:  Procedure Laterality Date  . ABDOMINAL HYSTERECTOMY    . CERVICAL CONIZATION W/BX N/A 07/01/2016   Procedure: CONIZATION CERVIX WITH BIOPSY;  Surgeon: Chancy Milroy, MD;  Location: [facility];  Service: Gynecology;  Laterality: N/A;  . CHOLECYSTECTOMY    . EXTERNAL FIXATION LEG Right 04/09/2020   Procedure: EXTERNAL FIXATION LEG;  Surgeon: Hiram Gash, MD;  Location: [place];  Service: Orthopedics;  Laterality: Right;  . HYSTEROSCOPY WITH D & C N/A 01/25/2016   Procedure: DILATATION AND CURETTAGE /HYSTEROSCOPY;  Surgeon: Mora Bellman, MD;  Location: [facility];  Service: Gynecology;  Laterality: N/A;  . LYMPH NODE BIOPSY Bilateral 07/22/2019   Procedure: LYMPH NODE BIOPSY;  Surgeon: Everitt Amber, MD;  Location: [facility];   Service: Gynecology;  Laterality: Bilateral;  . OPEN REDUCTION INTERNAL FIXATION (ORIF) TIBIA/FIBULA FRACTURE Right 04/10/2020   Procedure: OPEN REDUCTION INTERNAL FIXATION (ORIF) TIBIA/FIBULA FRACTURE;  Surgeon: Shona Needles, MD;  Location: [place];  Service: Orthopedics;  Laterality: Right;  . ROBOT ASSISTED MYOMECTOMY N/A 07/22/2019   Procedure: XI ROBOTIC ASSISTED LAPAROSCOPIC RADICAL UPPER VAGINECTOMY, LEFT SALPINECTOMY, RIGHT SALPINGOOOPHERECTOMY;  Surgeon: Everitt Amber, MD;  Location[facility];  Service: Gynecology;  Laterality: N/A;  . VAGINAL HYSTERECTOMY N/A 03/11/2017   Procedure: HYSTERECTOMY VAGINAL WITH MORCELLATION;  Surgeon: Chancy Milroy, MD;  Location: [facility];  Service: Gynecology;  Laterality: N/A;    Past Medical Hx:  Past Medical History:  Diagnosis Date  . Alcohol abuse   . Anemia    patient denies  . Bipolar 1 disorder (HCC)    No medications currently  . CAP (community acquired pneumonia) 03/17/2015  . 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 Mental disorder   . Obesity   . PICC line infection 05/17/2015  . Sepsis due to Gram negative bacteria (MDR E Coli) 02/18/2015  . 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[place])   . UTI (lower urinary tract infection)   . Vaginal Pap smear, abnormal     Past Gynecological History:  See HPI No LMP recorded (lmp unknown). Patient has had a hysterectomy.  Family Hx:  Family History  Problem Relation Age of Onset  . Alcohol abuse Father   . Cancer Father   . Breast cancer Neg Hx     Review of Systems:  Constitutional  Feels well,    ENT Normal appearing ears and nares bilaterally Skin/Breast  No rash, sores, jaundice, itching, dryness Cardiovascular  No chest pain, shortness of breath, or edema  Pulmonary  No cough or wheeze.  Gastro Intestinal  No nausea, vomitting, or diarrhoea. No bright red blood per rectum, no abdominal pain, change in bowel movement, or constipation.  Genito Urinary  No  frequency, urgency, dysuria, or discharge. Not sexually active. + vaginal pain, no bleeding.  Musculo Skeletal  No myalgia, arthralgia, joint swelling or pain  Neurologic  No weakness, numbness, change in gait,  Psychology  No depression, anxiety, insomnia.   Vitals:  Blood pressure 115/76, pulse 92, temperature (!) 97.2 F (36.2 C), temperature source Tympanic, resp. rate 20, height 5\' 8"  (1.727 m), weight 288 lb (130.6 kg), SpO2 98 %.  Physical Exam: WD in NAD Neck  Supple NROM, without any enlargements.  Lymph Node Survey No cervical supraclavicular or inguinal adenopathy Cardiovascular  Pulse normal rate, regularity and rhythm. S1 and S2 normal.  Lungs  Clear to auscultation bilateraly, without wheezes/crackles/rhonchi. Good air movement.  Skin  No rash/lesions/breakdown  Psychiatry  Alert and oriented to person, place, and time  Abdomen  Normoactive bowel sounds, abdomen soft, non-tender and obese without evidence of hernia. Soft abdominal incisions.  Back No CVA tenderness Genito Urinary  Vulva/vagina: Normal external female genitalia.  No lesions. No discharge or bleeding.  Bladder/urethra:  No lesions or masses, well supported bladder  Vagina: vaginal cuff smooth, free of lesions or masses or blood.  Rectal  deferred Extremities  No bilateral cyanosis, clubbing or edema.   Thereasa Solo, MD  09/19/2020, 4:59 PM

## 2020-09-20 ENCOUNTER — Encounter: Payer: Self-pay | Admitting: General Practice

## 2020-09-20 ENCOUNTER — Ambulatory Visit: Payer: Medicaid Other | Attending: Student

## 2020-09-20 ENCOUNTER — Telehealth: Payer: Self-pay | Admitting: Oncology

## 2020-09-20 DIAGNOSIS — R600 Localized edema: Secondary | ICD-10-CM | POA: Insufficient documentation

## 2020-09-20 DIAGNOSIS — M25571 Pain in right ankle and joints of right foot: Secondary | ICD-10-CM | POA: Insufficient documentation

## 2020-09-20 DIAGNOSIS — Z9889 Other specified postprocedural states: Secondary | ICD-10-CM | POA: Diagnosis present

## 2020-09-20 DIAGNOSIS — Z8781 Personal history of (healed) traumatic fracture: Secondary | ICD-10-CM | POA: Insufficient documentation

## 2020-09-20 DIAGNOSIS — R262 Difficulty in walking, not elsewhere classified: Secondary | ICD-10-CM | POA: Insufficient documentation

## 2020-09-20 NOTE — Therapy (Signed)
[place], [place], [address] Phone: [phone]   Fax:  [phone]  Physical Therapy Treatment  Patient Details  Name: Jordan [name] MRN: [id number] Date of Birth: [birth year] Referring Provider (PT): Delray Alt, PA-C   Encounter Date: 09/20/2020   PT End of Session - 09/20/20 1422    Visit Number 6    Number of Visits 16    Date for PT Re-Evaluation 10/17/20    Authorization Type MCD    Authorization Time Period 09/06/2020 - 10/17/2020    Authorization - Visit Number 1    Authorization - Number of Visits 12    PT Start Time 6962    PT Stop Time 1507    PT Time Calculation (min) 42 min    Activity Tolerance Patient tolerated treatment well    Behavior During Therapy [facility] for tasks assessed/performed           Past Medical History:  Diagnosis Date  . Alcohol abuse   . Anemia    patient denies  . Bipolar 1 disorder (HCC)    No medications currently  . CAP (community acquired pneumonia) 03/17/2015  . 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 Mental disorder   . Obesity   . PICC line infection 05/17/2015  . Sepsis due to Gram negative bacteria (MDR E Coli) 02/18/2015  . 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[place])   . UTI (lower urinary tract infection)   . Vaginal Pap smear, abnormal     Past Surgical History:  Procedure Laterality Date  . ABDOMINAL HYSTERECTOMY    . CERVICAL CONIZATION W/BX N/A 07/01/2016   Procedure: CONIZATION CERVIX WITH BIOPSY;  Surgeon: Chancy Milroy, MD;  Location: [facility];  Service: Gynecology;  Laterality: N/A;  . CHOLECYSTECTOMY    . EXTERNAL FIXATION LEG Right 04/09/2020   Procedure: EXTERNAL FIXATION LEG;  Surgeon: Hiram Gash, MD;  Location: [place];  Service: Orthopedics;  Laterality: Right;  . HYSTEROSCOPY WITH D & C N/A 01/25/2016   Procedure: DILATATION AND CURETTAGE /HYSTEROSCOPY;  Surgeon: Mora Bellman, MD;  Location: [facility];  Service:  Gynecology;  Laterality: N/A;  . LYMPH NODE BIOPSY Bilateral 07/22/2019   Procedure: LYMPH NODE BIOPSY;  Surgeon: Everitt Amber, MD;  [facility];  Service: Gynecology;  Laterality: Bilateral;  . OPEN REDUCTION INTERNAL FIXATION (ORIF) TIBIA/FIBULA FRACTURE Right 04/10/2020   Procedure: OPEN REDUCTION INTERNAL FIXATION (ORIF) TIBIA/FIBULA FRACTURE;  Surgeon: Shona Needles, MD;  Location: [facility];  Service: Orthopedics;  Laterality: Right;  . ROBOT ASSISTED MYOMECTOMY N/A 07/22/2019   Procedure: XI ROBOTIC ASSISTED LAPAROSCOPIC RADICAL UPPER VAGINECTOMY, LEFT SALPINECTOMY, RIGHT SALPINGOOOPHERECTOMY;  Surgeon: Everitt Amber, MD;  [facility];  Service: Gynecology;  Laterality: N/A;  . VAGINAL HYSTERECTOMY N/A 03/11/2017   Procedure: HYSTERECTOMY VAGINAL WITH MORCELLATION;  Surgeon: Chancy Milroy, MD;  Location: [facility];  Service: Gynecology;  Laterality: N/A;    There were no vitals filed for this visit.   Subjective Assessment - 09/20/20 1410    Subjective "I'm hurting but that's not new. I've been really stressed the past few days. I have medical problems and it has been hurting down there." Pt verbalizes compliance with HEP. Pt reports that her new rollator arrived but the arm handle came off and screw was loose on the side. She states that she tried calling for a new one but was told to contact Medicaid, which she did not want to do. She  plans to call again to see if she can get her new rollator replaced.    How long can you walk comfortably? --    Patient Stated Goals decrease pain, get out boot, walk without AD (PLOF)    Currently in Pain? Yes    Pain Score 10-Worst pain ever    Pain Location Leg    Pain Orientation Right    Pain Descriptors / Indicators Aching    Pain Type Chronic pain    Pain Onset More than a month ago              [facility] PT Assessment - 09/20/20 0001      Assessment   Medical Diagnosis s/p Rt tib-fib ORIF    Referring Provider (PT) Delray Alt, PA-C     Onset Date/Surgical Date 04/10/20                         [facility] Adult PT Treatment/Exercise - 09/20/20 0001      Neuro Re-ed    Neuro Re-ed Details  1/2 tandem stance with RLE posterior to LLE x 30 sec; 3/4 tandem stance with RLE posterior to LLE x 30 sec      Ankle Exercises: Aerobic   Nustep L5 x 6 min with UE and LE for ankle motion and BLE endurance      Ankle Exercises: Standing   Heel Raises Other (comment);Both   25   Toe Raise Other (comment)   25     Additional Ankle Exercises DO NOT USE   Towel Crunch Limitations 2 x 20      Ankle Exercises: Stretches   Slant Board Stretch 4 reps;30 seconds      Ankle Exercises: Supine   T-Band 4-way with red band 2 x 20 each   seated at EOM with RLE propped on PT's LE     Ankle Exercises: Seated   Marble Pickup 10 marbles - pt able to pick up 5 marbles and place in cup within 5 minutes                  PT Education - 09/20/20 1512    Education Details Reviewed HEP and provided pt with new red theraband for band ankle exercises. Advised patient to attempt to contact [facility] again regarding rollator replacement. PT will assist pt in contacting if she is still unable to get in contact with someone about replacement. Several cues throughout session for pt to push off from stable surface and make sure rollator is locked prior to transfers.    Person(s) Educated Patient    Methods Explanation;Demonstration;Verbal cues    Comprehension Verbalized understanding;Returned demonstration;Verbal cues required;Need further instruction            PT Short Term Goals - 08/22/20 1554      PT SHORT TERM GOAL #1   Title ankle DF to 5 deg      PT SHORT TERM GOAL #2   Title pt will demo full weight acceptance into Rt foot in static stance    Baseline able to demo full weight acceptance    Status Achieved             PT Long Term Goals - 08/22/20 1740      PT LONG TERM GOAL #1   Title pt will be able  to ambulate safely around the clinic without AD    Baseline full time use of rollator at eval  Time 8   POC 6 weeks, incr time to accommodate MCD auth   Period Weeks    Status New    Target Date 10/17/20      PT LONG TERM GOAL #2   Title pain with ADLs <=2/10    Baseline 3/10 at rest using boot and AD at eval    Time 8    Period Weeks    Status New    Target Date 10/17/20      PT LONG TERM GOAL #3   Title pt will ambulate in the community using LRAD    Baseline rollator at eval, PLOF was without AD, will determine need for AD based on balance and safety    Time 8    Period Weeks    Status New    Target Date 10/17/20      PT LONG TERM GOAL #4   Title pt will demo SLS comfortably for at least 5s without UE assist to demo full weight acceptance as well as balance control for safety    Baseline able to accept weight on Rt LE today but unable to demo controlled SLS    Time 8    Period Weeks    Status New    Target Date 10/17/20                 Plan - 09/20/20 1423    Clinical Impression Statement Patient tolerated treatment session well with no adverse effects. Pt did not present with any visual signs of distress or pain during exercises or with palpation but confirms 10/10 throughout treatment session in R leg. Patient was able to perform heel raises and toe raises in standing with fair tolerance requiring seated rest breaks between sets.    Personal Factors and Comorbidities Fitness;Time since onset of injury/illness/exacerbation;Comorbidity 1;Education    Comorbidities mental disorder    Examination-Activity Limitations Sit;Squat;Stairs;Stand;Transfers;Locomotion Level    Examination-Participation Restrictions Community Activity;Other    Stability/Clinical Decision Making Evolving/Moderate complexity    Rehab Potential Good    PT Frequency 2x / week    PT Duration 6 weeks    PT Treatment/Interventions ADLs/Self Care Home Management;Cryotherapy;Electrical  Stimulation;Gait training;Neuromuscular re-education;Therapeutic exercise;Therapeutic activities;Functional mobility training;Stair training;Patient/family education;Manual techniques;Taping;Passive range of motion;Scar mobilization;Vasopneumatic Device    PT Next Visit Plan Progress ankle motion and strength, progress banded OKC strengthening, progress to standing strengthening PRN, gait training in // bars without AD    PT Home Exercise Plan [id number]    Consulted and Agree with Plan of Care Patient           Patient will benefit from skilled therapeutic intervention in order to improve the following deficits and impairments:  Abnormal gait, Decreased range of motion, Difficulty walking, Decreased activity tolerance, Pain, Improper body mechanics, Impaired flexibility, Decreased strength  Visit Diagnosis: Status post open reduction with internal fixation (ORIF) of fracture of ankle  Pain in right ankle and joints of right foot  Difficulty in walking, not elsewhere classified  Localized edema     Problem List Patient Active Problem List   Diagnosis Date Noted  . Acute blood loss anemia   . Leukopenia   . Vitamin D deficiency   . Acute lower UTI   . Postoperative pain   . Tibia/fibula fracture, right, sequela 04/15/2020  . Closed fracture of medial malleolus of right ankle 04/10/2020  . Open fracture of distal end of fibula and tibia, right, type I or II, initial encounter 04/09/2020  . Vaginal cancer ([place])  07/22/2019  . Morbid obesity with BMI of 40.0-44.9, adult ([place]) 06/11/2019  . 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[place]) 05/28/2019  . Visit for routine gyn exam 04/21/2019  . Vaginal atrophy 04/21/2019  . Painful lumpy right breast 07/27/2016  . Urinary tract infection 07/01/2015  . Diastolic dysfunction 67/22/7737  . Constipation 05/12/2015  . ESBL (extended spectrum beta-lactamase) producing bacteria infection 03/17/2015  . Diarrhea 03/03/2015  . Weakness 03/02/2015  .  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 Anxiety state 02/20/2015  . Claustrophobia 02/20/2015  . Transaminitis 02/18/2015  . Chest pain 02/18/2015  . Abdominal pain   . Dizziness   . Hypotension 01/04/2015  . Bipolar disorder ([place]) 06/29/2012    Haydee Monica, PT, DPT 09/20/20 3:25 PM  [facility] [address] [place], [place], [address] Phone: [phone]   Fax:  [phone]  Name: Jordan [name] MRN: [id number] Date of Birth: 24-Oct-1965

## 2020-09-20 NOTE — Telephone Encounter (Signed)
Returned Jordan [name]'s call and discussed that her surgery with the urologist is going to be scheduled in January pending insurance approval.  Also discussed the good results from her PET scan.

## 2020-09-20 NOTE — Progress Notes (Signed)
[facility] Spiritual Care Note  Received calls from [place] to share and celebrate the good news from her PET scan. Talking things through helps her process details and feelings. She will call again as needed.   [place], [place], [facility] Pager [phone] Voicemail [phone]

## 2020-09-21 ENCOUNTER — Encounter: Payer: Self-pay | Admitting: General Practice

## 2020-09-21 NOTE — Progress Notes (Signed)
[facility] Spiritual Care Note  Received and returned call from [place], rescheduling her upcoming [place] appointment to Tuesday 12/21 at [place], [place], [facility] Pager [phone] Voicemail [phone]

## 2020-09-22 ENCOUNTER — Encounter: Payer: Self-pay | Admitting: General Practice

## 2020-09-22 ENCOUNTER — Telehealth: Payer: Self-pay

## 2020-09-22 NOTE — Telephone Encounter (Signed)
Left VM to call on Monday to reschedule.   [name] Counseling Intern

## 2020-09-22 NOTE — Telephone Encounter (Signed)
Returned patient calls/VM to reschedule session.  [name] Counseling Intern

## 2020-09-22 NOTE — Telephone Encounter (Signed)
Patient called to discuss potentially moving session times.  Jordan [name] Counseling Intern

## 2020-09-22 NOTE — Progress Notes (Signed)
[place] Note  Assisted Lesta with scheduling a ride via [facility] transportation services for her appointment with Counseling Intern [name] at 2pm on Monday, December 6. Transportation will phone Jenita directly to inquire about wheelchair needs.   [place], [place], [facility] Pager [phone] Voicemail [phone]

## 2020-09-25 ENCOUNTER — Telehealth: Payer: Self-pay

## 2020-09-25 ENCOUNTER — Ambulatory Visit: Payer: Medicaid Other | Admitting: Gynecologic Oncology

## 2020-09-25 NOTE — Telephone Encounter (Signed)
Returned call to confirm today's appointment.  [name] Counseling Intern

## 2020-09-25 NOTE — Telephone Encounter (Signed)
Called to check in about transportation to appointment today. Left voicemail.  [name] Counseling Intern

## 2020-09-25 NOTE — Progress Notes (Signed)
[place] Patient and [facility] Counseling Note   Patient and counselor started session by processing this as the last session for the year and what it feels like when counseling relationships come to an end. Patient described her many frustrations at her [place], mistreatment by doctors, and her UTI situation. Patient identified feelings and sat with them for a moment. Patient described feeling alone and "scared out of her mind." In session, patient was oriented times three. She showed no signs of SI/HI/NSSI. Her mood was fearful, angry, sad, and happy at times, with a labile affect. Patient is scared because of her thoughts around worrying about the return of cancer/other medical problems, yet that is not what distresses her the most. Her family's lack of support brings up thoughts that they don't love her or that is not worthy of help, which makes her feel sad, betrayed, hurt, and angry. These feelings result in emotional outbursts. Next session scheduled for after counselor's break and will catch up and look ahead at goals.     [name] Counseling Intern

## 2020-09-26 ENCOUNTER — Other Ambulatory Visit: Payer: Self-pay

## 2020-09-26 ENCOUNTER — Ambulatory Visit: Payer: Medicaid Other

## 2020-09-26 DIAGNOSIS — R262 Difficulty in walking, not elsewhere classified: Secondary | ICD-10-CM

## 2020-09-26 DIAGNOSIS — Z8781 Personal history of (healed) traumatic fracture: Secondary | ICD-10-CM

## 2020-09-26 DIAGNOSIS — M25571 Pain in right ankle and joints of right foot: Secondary | ICD-10-CM

## 2020-09-26 DIAGNOSIS — Z9889 Other specified postprocedural states: Secondary | ICD-10-CM

## 2020-09-26 DIAGNOSIS — R600 Localized edema: Secondary | ICD-10-CM

## 2020-09-26 NOTE — Therapy (Signed)
[place], [place], [address] Phone: [phone]   Fax:  [phone]  Physical Therapy Treatment  Patient Details  Name: Jordan [name] MRN: [id number] Date of Birth: 03-07-1966 Referring Provider (PT): Delray Alt, PA-C   Encounter Date: 09/26/2020   PT End of Session - 09/26/20 1455    Visit Number 7    Number of Visits 16    Date for PT Re-Evaluation 10/17/20    Authorization Type MCD    Authorization Time Period 09/06/2020 - 10/17/2020    Authorization - Visit Number 1    Authorization - Number of Visits 12    PT Start Time 6812    PT Stop Time 1524    PT Time Calculation (min) 39 min    Equipment Utilized During Treatment Gait belt   gait belt for contact/min guard assist during gait training   Activity Tolerance Patient tolerated treatment well    Behavior During Therapy WFL for tasks assessed/performed           Past Medical History:  Diagnosis Date  . Alcohol abuse   . Anemia    patient denies  . Bipolar 1 disorder (HCC)    No medications currently  . CAP (community acquired pneumonia) 03/17/2015  . 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 Mental disorder   . Obesity   . PICC line infection 05/17/2015  . Sepsis due to Gram negative bacteria (MDR E Coli) 02/18/2015  . 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)   . UTI (lower urinary tract infection)   . Vaginal Pap smear, abnormal     Past Surgical History:  Procedure Laterality Date  . ABDOMINAL HYSTERECTOMY    . CERVICAL CONIZATION W/BX N/A 07/01/2016   Procedure: CONIZATION CERVIX WITH BIOPSY;  Surgeon: Chancy Milroy, MD;  Location: [facility];  Service: Gynecology;  Laterality: N/A;  . CHOLECYSTECTOMY    . EXTERNAL FIXATION LEG Right 04/09/2020   Procedure: EXTERNAL FIXATION LEG;  Surgeon: Hiram Gash, MD;  Location: [place];  Service: Orthopedics;  Laterality: Right;  . HYSTEROSCOPY WITH D & C N/A 01/25/2016    Procedure: DILATATION AND CURETTAGE /HYSTEROSCOPY;  Surgeon: Mora Bellman, MD;  Location: [facility];  Service: Gynecology;  Laterality: N/A;  . LYMPH NODE BIOPSY Bilateral 07/22/2019   Procedure: LYMPH NODE BIOPSY;  Surgeon: Everitt Amber, MD;  Location: [facility];  Service: Gynecology;  Laterality: Bilateral;  . OPEN REDUCTION INTERNAL FIXATION (ORIF) TIBIA/FIBULA FRACTURE Right 04/10/2020   Procedure: OPEN REDUCTION INTERNAL FIXATION (ORIF) TIBIA/FIBULA FRACTURE;  Surgeon: Shona Needles, MD;  Location: [facility];  Service: Orthopedics;  Laterality: Right;  . ROBOT ASSISTED MYOMECTOMY N/A 07/22/2019   Procedure: XI ROBOTIC ASSISTED LAPAROSCOPIC RADICAL UPPER VAGINECTOMY, LEFT SALPINECTOMY, RIGHT SALPINGOOOPHERECTOMY;  Surgeon: Everitt Amber, MD;  [facility];  Service: Gynecology;  Laterality: N/A;  . VAGINAL HYSTERECTOMY N/A 03/11/2017   Procedure: HYSTERECTOMY VAGINAL WITH MORCELLATION;  Surgeon: Chancy Milroy, MD;  Location: [facility];  Service: Gynecology;  Laterality: N/A;    There were no vitals filed for this visit.   Subjective Assessment - 09/26/20 1644    Subjective "I'm hurting, but it's the same pain. It is frustrating and makes me mad that I'm dealing with the other pain down there that I've had for 15 years and just get told to take medicine that's not helping like it should." Pt reports 2/10 pain in R lower leg today.    How  long can you walk comfortably? 10 min or so out of boot    Patient Stated Goals decrease pain, get out boot, walk without AD (PLOF)    Currently in Pain? Yes    Pain Score 2     Pain Location Leg    Pain Orientation Right;Lower    Pain Descriptors / Indicators Aching    Pain Type Chronic pain    Pain Onset More than a month ago              [facility] PT Assessment - 09/26/20 0001      Assessment   Medical Diagnosis s/p Rt tib-fib ORIF    Referring Provider (PT) Delray Alt, PA-C    Onset Date/Surgical Date 04/10/20                          [facility] Adult PT Treatment/Exercise - 09/26/20 0001      Transfers   Five time sit to stand comments  32.15 seconds with BUE assist for initial STS and not afterwards. Performed from low mat with rollator locked in front of pt for safety.      Ambulation/Gait   Ambulation/Gait Yes    Ambulation/Gait Assistance 4: Min guard    Ambulation Distance (Feet) 53 Feet   53 x 2 with 2 min seated rest break between gait trips   Gait Pattern Step-through pattern;Lateral trunk lean to right;Lateral trunk lean to left    Ambulation Surface Level;Indoor    Gait Comments Gait training also performed within parallell bars with no AD 4x back and forth within bars - pt used BUE for support within bars. Cues for heel-toe gait pattern and to avoid lateral trunk lean compensation as able (visual cue telling pt to look in mirror)      Neuro Re-ed    Neuro Re-ed Details  3/4 tandem standing with RLE posterior to LLE x 40 sec      Ankle Exercises: Aerobic   Nustep L4 x 6 min with UE and LE for ankle mobility and endurance      Additional Ankle Exercises DO NOT USE   Towel Crunch Limitations 30x      Ankle Exercises: Supine   T-Band 4-way with green band x 20 each   Pt at EOM with RLE propped on PT's leg     Ankle Exercises: Standing   Heel Raises Other (comment);Both   25; BUE at free motion as needed   Toe Raise 20 reps   BUE at freemotion as needed   Other Standing Ankle Exercises Alternating marches while standing on airex x 30 (15x each LE) with BUE support at freemotion as needed      Ankle Exercises: Seated   Towel Crunch Other (comment)   30                 PT Education - 09/26/20 1630    Education Details Reviewed HEP and provided pt with green theraband for band ankle exercises. Inquired about pt's situation with rollator replacement and contacting [facility] but pt has not been able to get a replacement yet. Cues provided throughout session for pt to  lock rollator pior to transfers and push off from stable surface when performing sit<>stand.    Person(s) Educated Patient    Methods Explanation;Demonstration;Verbal cues    Comprehension Verbalized understanding;Returned demonstration;Verbal cues required;Need further instruction            PT Short  Term Goals - 08/22/20 1554      PT SHORT TERM GOAL #1   Title ankle DF to 5 deg      PT SHORT TERM GOAL #2   Title pt will demo full weight acceptance into Rt foot in static stance    Baseline able to demo full weight acceptance    Status Achieved             PT Long Term Goals - 08/22/20 1740      PT LONG TERM GOAL #1   Title pt will be able to ambulate safely around the clinic without AD    Baseline full time use of rollator at eval    Time 8   POC 6 weeks, incr time to accommodate MCD auth   Period Weeks    Status New    Target Date 10/17/20      PT LONG TERM GOAL #2   Title pain with ADLs <=2/10    Baseline 3/10 at rest using boot and AD at eval    Time 8    Period Weeks    Status New    Target Date 10/17/20      PT LONG TERM GOAL #3   Title pt will ambulate in the community using LRAD    Baseline rollator at eval, PLOF was without AD, will determine need for AD based on balance and safety    Time 8    Period Weeks    Status New    Target Date 10/17/20      PT LONG TERM GOAL #4   Title pt will demo SLS comfortably for at least 5s without UE assist to demo full weight acceptance as well as balance control for safety    Baseline able to accept weight on Rt LE today but unable to demo controlled SLS    Time 8    Period Weeks    Status New    Target Date 10/17/20                 Plan - 09/26/20 1459    Clinical Impression Statement Patient tolerated physical therapy with no adverse effects or complaints of increased pain. She requires frequent seated rest breaks between interventions but was able to complete sets of exercises without breaks inbetween  today. Pt able to ambulate without AD in therapy gym but demonstrates unsteadiness and excessive lateral trunk lean to both sides while ambulating (leans towards stance leg). PT will incorporate glute med strengthening and continue gait training with/without AD for improved safety during ambulation. Pt will continue to benefit from skilled PT intervention to increase strength and endurance for improved tolerance with functional activities.    Personal Factors and Comorbidities Fitness;Time since onset of injury/illness/exacerbation;Comorbidity 1;Education    Comorbidities mental disorder    Examination-Activity Limitations Sit;Squat;Stairs;Stand;Transfers;Locomotion Level    Examination-Participation Restrictions Community Activity;Other    Stability/Clinical Decision Making Evolving/Moderate complexity    Rehab Potential Good    PT Frequency 2x / week    PT Duration 6 weeks    PT Treatment/Interventions ADLs/Self Care Home Management;Cryotherapy;Electrical Stimulation;Gait training;Neuromuscular re-education;Therapeutic exercise;Therapeutic activities;Functional mobility training;Stair training;Patient/family education;Manual techniques;Taping;Passive range of motion;Scar mobilization;Vasopneumatic Device    PT Next Visit Plan Ask pt for updates regarding rollator replacement. Progress ankle motion and strength, progress banded [place] strengthening, progress to standing strengthening PRN, gait training in // bars without AD, hip ABD/glute med strengthening    PT [place] and  Agree with Plan of Care Patient           Patient will benefit from skilled therapeutic intervention in order to improve the following deficits and impairments:  Abnormal gait, Decreased range of motion, Difficulty walking, Decreased activity tolerance, Pain, Improper body mechanics, Impaired flexibility, Decreased strength  Visit Diagnosis: Status post open reduction with internal fixation  (ORIF) of fracture of ankle  Pain in right ankle and joints of right foot  Difficulty in walking, not elsewhere classified  Localized edema     Problem List Patient Active Problem List   Diagnosis Date Noted  . Acute blood loss anemia   . Leukopenia   . Vitamin D deficiency   . Acute lower UTI   . Postoperative pain   . Tibia/fibula fracture, right, sequela 04/15/2020  . Closed fracture of medial malleolus of right ankle 04/10/2020  . Open fracture of distal end of fibula and tibia, right, type I or II, initial encounter 04/09/2020  . Vaginal cancer ([place]) 07/22/2019  . Morbid obesity with BMI of 40.0-44.9, adult ([place]) 06/11/2019  . 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 Visit for routine gyn exam 04/21/2019  . Vaginal atrophy 04/21/2019  . Painful lumpy right breast 07/27/2016  . Urinary tract infection 07/01/2015  . Diastolic dysfunction 17/40/8144  . Constipation 05/12/2015  . ESBL (extended spectrum beta-lactamase) producing bacteria infection 03/17/2015  . Diarrhea 03/03/2015  . Weakness 03/02/2015  . 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 Anxiety state 02/20/2015  . Claustrophobia 02/20/2015  . Transaminitis 02/18/2015  . Chest pain 02/18/2015  . Abdominal pain   . Dizziness   . Hypotension 01/04/2015  . Bipolar disorder ([place]) 06/29/2012     Haydee Monica, PT, DPT 09/26/20 4:49 PM  [facility] [address] [place], [place], [address] Phone: [phone]   Fax:  [phone]  Name: Jordan [name] MRN: [id number] Date of Birth: November 29, 1965

## 2020-09-27 ENCOUNTER — Telehealth: Payer: Self-pay | Admitting: *Deleted

## 2020-09-27 NOTE — Telephone Encounter (Signed)
Patient called and stated "I wanted to know if it is ok for me to get this new COVID shot." Explained that per [facility] APP patient can have the COVID booster shot; to just wait six months

## 2020-09-28 ENCOUNTER — Ambulatory Visit: Payer: Medicaid Other | Admitting: Physical Therapy

## 2020-09-28 ENCOUNTER — Encounter: Payer: Self-pay | Admitting: Physical Therapy

## 2020-09-28 ENCOUNTER — Other Ambulatory Visit: Payer: Self-pay

## 2020-09-28 DIAGNOSIS — R262 Difficulty in walking, not elsewhere classified: Secondary | ICD-10-CM

## 2020-09-28 DIAGNOSIS — Z8781 Personal history of (healed) traumatic fracture: Secondary | ICD-10-CM

## 2020-09-28 DIAGNOSIS — Z9889 Other specified postprocedural states: Secondary | ICD-10-CM

## 2020-09-28 DIAGNOSIS — M25571 Pain in right ankle and joints of right foot: Secondary | ICD-10-CM

## 2020-09-28 NOTE — Therapy (Signed)
[place], [place], [address] Phone: [phone]   Fax:  [phone]  Physical Therapy Treatment  Patient Details  Name: Jordan [name] MRN: [id number] Date of Birth: 05-29-1966 Referring Provider (PT): Delray Alt, PA-C   Encounter Date: 09/28/2020   PT End of Session - 09/28/20 1550    Visit Number 8    Number of Visits 16    Date for PT Re-Evaluation 10/17/20    Authorization Type MCD    Authorization Time Period 09/06/2020 - 10/17/2020    Authorization - Visit Number 2    Authorization - Number of Visits 12    PT Start Time 5993    PT Stop Time 1623    PT Time Calculation (min) 38 min    Activity Tolerance Patient tolerated treatment well    Behavior During Therapy [facility] for tasks assessed/performed           Past Medical History:  Diagnosis Date  . Alcohol abuse   . Anemia    patient denies  . Bipolar 1 disorder (HCC)    No medications currently  . CAP (community acquired pneumonia) 03/17/2015  . 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 Mental disorder   . Obesity   . PICC line infection 05/17/2015  . Sepsis due to Gram negative bacteria (MDR E Coli) 02/18/2015  . 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 UTI (lower urinary tract infection)   . Vaginal Pap smear, abnormal     Past Surgical History:  Procedure Laterality Date  . ABDOMINAL HYSTERECTOMY    . CERVICAL CONIZATION W/BX N/A 07/01/2016   Procedure: CONIZATION CERVIX WITH BIOPSY;  Surgeon: Chancy Milroy, MD;  Location: [facility];  Service: Gynecology;  Laterality: N/A;  . CHOLECYSTECTOMY    . EXTERNAL FIXATION LEG Right 04/09/2020   Procedure: EXTERNAL FIXATION LEG;  Surgeon: Hiram Gash, MD;  Location: [place];  Service: Orthopedics;  Laterality: Right;  . HYSTEROSCOPY WITH D & C N/A 01/25/2016   Procedure: DILATATION AND CURETTAGE /HYSTEROSCOPY;  Surgeon: Mora Bellman, MD;  Location: [facility];  Service:  Gynecology;  Laterality: N/A;  . LYMPH NODE BIOPSY Bilateral 07/22/2019   Procedure: LYMPH NODE BIOPSY;  Surgeon: Everitt Amber, MD;  [facility];  Service: Gynecology;  Laterality: Bilateral;  . OPEN REDUCTION INTERNAL FIXATION (ORIF) TIBIA/FIBULA FRACTURE Right 04/10/2020   Procedure: OPEN REDUCTION INTERNAL FIXATION (ORIF) TIBIA/FIBULA FRACTURE;  Surgeon: Shona Needles, MD;  Location: [place];  Service: Orthopedics;  Laterality: Right;  . ROBOT ASSISTED MYOMECTOMY N/A 07/22/2019   Procedure: XI ROBOTIC ASSISTED LAPAROSCOPIC RADICAL UPPER VAGINECTOMY, LEFT SALPINECTOMY, RIGHT SALPINGOOOPHERECTOMY;  Surgeon: Everitt Amber, MD;  [facility];  Service: Gynecology;  Laterality: N/A;  . VAGINAL HYSTERECTOMY N/A 03/11/2017   Procedure: HYSTERECTOMY VAGINAL WITH MORCELLATION;  Surgeon: Chancy Milroy, MD;  Location: [facility];  Service: Gynecology;  Laterality: N/A;    There were no vitals filed for this visit.   Subjective Assessment - 09/28/20 1549    Subjective My leg hurts. I took some pain medicine right before [employer] ride came and got me.    Patient Stated Goals decrease pain, get out boot, walk without AD (PLOF)    Currently in Pain? Yes    Pain Score 8     Pain Location Leg    Pain Orientation Right;Lower    Pain Descriptors / Indicators Sore    Aggravating Factors  weight bearing  Pain Relieving Factors rest                             [facility] Adult PT Treatment/Exercise - 09/28/20 0001      Ankle Exercises: Aerobic   Nustep L6 5 min LE only      Ankle Exercises: Standing   Side Shuffle (Round Trip) in parallel bars with high knees, cues to keep toes fwd, stepping wihtout hands on bar- cues for posture    Other Standing Ankle Exercises step ups 4" in parallel bars    Other Standing Ankle Exercises step taps 4" step in parallel bars, wide tandem weight shift                  PT Education - 09/28/20 1606    Education Details monitored rest breaks,  importance of weight bearing for bone healing, gait pattern    Person(s) Educated Patient    Methods Explanation    Comprehension Verbalized understanding;Need further instruction            PT Short Term Goals - 08/22/20 1554      PT SHORT TERM GOAL #1   Title ankle DF to 5 deg      PT SHORT TERM GOAL #2   Title pt will demo full weight acceptance into Rt foot in static stance    Baseline able to demo full weight acceptance    Status Achieved             PT Long Term Goals - 08/22/20 1740      PT LONG TERM GOAL #1   Title pt will be able to ambulate safely around the clinic without AD    Baseline full time use of rollator at eval    Time 8   POC 6 weeks, incr time to accommodate MCD auth   Period Weeks    Status New    Target Date 10/17/20      PT LONG TERM GOAL #2   Title pain with ADLs <=2/10    Baseline 3/10 at rest using boot and AD at eval    Time 8    Period Weeks    Status New    Target Date 10/17/20      PT LONG TERM GOAL #3   Title pt will ambulate in the community using LRAD    Baseline rollator at eval, PLOF was without AD, will determine need for AD based on balance and safety    Time 8    Period Weeks    Status New    Target Date 10/17/20      PT LONG TERM GOAL #4   Title pt will demo SLS comfortably for at least 5s without UE assist to demo full weight acceptance as well as balance control for safety    Baseline able to accept weight on Rt LE today but unable to demo controlled SLS    Time 8    Period Weeks    Status New    Target Date 10/17/20                 Plan - 09/28/20 1619    Clinical Impression Statement Practicied step ups on 4" step today using parallel bars. She would like to try the steps at her next appointment so she is able to navigate her steps at home.    PT Treatment/Interventions ADLs/Self Care Home Management;Cryotherapy;Electrical Stimulation;Gait training;Neuromuscular re-education;Therapeutic  exercise;Therapeutic activities;Functional mobility training;Stair training;Patient/family education;Manual techniques;Taping;Passive range of motion;Scar mobilization;Vasopneumatic Device    PT Next Visit Plan stairs, gait training without rollator    PT Home Exercise Plan [place] and Agree with Plan of Care Patient           Patient will benefit from skilled therapeutic intervention in order to improve the following deficits and impairments:  Abnormal gait,Decreased range of motion,Difficulty walking,Decreased activity tolerance,Pain,Improper body mechanics,Impaired flexibility,Decreased strength  Visit Diagnosis: Status post open reduction with internal fixation (ORIF) of fracture of ankle  Pain in right ankle and joints of right foot  Difficulty in walking, not elsewhere classified     Problem List Patient Active Problem List   Diagnosis Date Noted  . Acute blood loss anemia   . Leukopenia   . Vitamin D deficiency   . Acute lower UTI   . Postoperative pain   . Tibia/fibula fracture, right, sequela 04/15/2020  . Closed fracture of medial malleolus of right ankle 04/10/2020  . Open fracture of distal end of fibula and tibia, right, type I or II, initial encounter 04/09/2020  . Vaginal cancer ([place]) 07/22/2019  . Morbid obesity with BMI of 40.0-44.9, adult ([place]) 06/11/2019  . 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 Visit for routine gyn exam 04/21/2019  . Vaginal atrophy 04/21/2019  . Painful lumpy right breast 07/27/2016  . Urinary tract infection 07/01/2015  . Diastolic dysfunction 96/28/3662  . Constipation 05/12/2015  . ESBL (extended spectrum beta-lactamase) producing bacteria infection 03/17/2015  . Diarrhea 03/03/2015  . Weakness 03/02/2015  . 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 Anxiety state 02/20/2015  . Claustrophobia 02/20/2015  . Transaminitis 02/18/2015  . Chest pain 02/18/2015  . Abdominal pain   . Dizziness   . Hypotension  01/04/2015  . Bipolar disorder ([place]) 06/29/2012    Graycee Greeson C. Apollo Timothy PT, DPT 09/28/20 4:27 PM   [place] [facility] [address] [place], [place], [address] Phone: [phone]   Fax:  [phone]  Name: Jordan [name] MRN: [id number] Date of Birth: April 14, 1966

## 2020-09-29 ENCOUNTER — Encounter: Payer: Self-pay | Admitting: General Practice

## 2020-09-29 NOTE — Progress Notes (Signed)
Spiritual Care Note  Received and returned voicemail from [place].   [place], [place], [facility] Pager [phone] Voicemail [phone]

## 2020-10-02 ENCOUNTER — Other Ambulatory Visit: Payer: Self-pay

## 2020-10-02 ENCOUNTER — Ambulatory Visit: Payer: Medicaid Other

## 2020-10-02 DIAGNOSIS — Z9889 Other specified postprocedural states: Secondary | ICD-10-CM

## 2020-10-02 DIAGNOSIS — R262 Difficulty in walking, not elsewhere classified: Secondary | ICD-10-CM

## 2020-10-02 DIAGNOSIS — R600 Localized edema: Secondary | ICD-10-CM

## 2020-10-02 DIAGNOSIS — M25571 Pain in right ankle and joints of right foot: Secondary | ICD-10-CM

## 2020-10-02 DIAGNOSIS — Z8781 Personal history of (healed) traumatic fracture: Secondary | ICD-10-CM

## 2020-10-02 NOTE — Therapy (Signed)
[employer] [place], [place], [address] Phone: [phone]   Fax:  [phone]  Physical Therapy Treatment  Patient Details  Name: Jordan [name] MRN: [id number] Date of Birth: July 29, 1966 Referring Provider (PT): Delray Alt, PA-C   Encounter Date: 10/02/2020   PT End of Session - 10/02/20 1817    Visit Number 9    Number of Visits 16    Date for PT Re-Evaluation 10/17/20    Authorization Type MCD    Authorization Time Period 09/06/2020 - 10/17/2020    Authorization - Visit Number 3    Authorization - Number of Visits 12    PT Start Time 1500   pr arrived late   PT Stop Time 1529    PT Time Calculation (min) 29 min    Activity Tolerance Patient tolerated treatment well    Behavior During Therapy [facility] for tasks assessed/performed           Past Medical History:  Diagnosis Date  . Alcohol abuse   . Anemia    patient denies  . Bipolar 1 disorder (HCC)    No medications currently  . CAP (community acquired pneumonia) 03/17/2015  . 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 Mental disorder   . Obesity   . PICC line infection 05/17/2015  . Sepsis due to Gram negative bacteria (MDR E Coli) 02/18/2015  . 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 UTI (lower urinary tract infection)   . Vaginal Pap smear, abnormal     Past Surgical History:  Procedure Laterality Date  . ABDOMINAL HYSTERECTOMY    . CERVICAL CONIZATION W/BX N/A 07/01/2016   Procedure: CONIZATION CERVIX WITH BIOPSY;  Surgeon: Chancy Milroy, MD;  Location: [facility] [facility];  Service: Gynecology;  Laterality: N/A;  . CHOLECYSTECTOMY    . EXTERNAL FIXATION LEG Right 04/09/2020   Procedure: EXTERNAL FIXATION LEG;  Surgeon: Hiram Gash, MD;  Location: [place];  Service: Orthopedics;  Laterality: Right;  . HYSTEROSCOPY WITH D & C N/A 01/25/2016   Procedure: DILATATION AND CURETTAGE /HYSTEROSCOPY;  Surgeon: Mora Bellman, MD;  Location: [place]  [facility];  Service: Gynecology;  Laterality: N/A;  . LYMPH NODE BIOPSY Bilateral 07/22/2019   Procedure: LYMPH NODE BIOPSY;  Surgeon: Everitt Amber, MD;  Location: [facility];  Service: Gynecology;  Laterality: Bilateral;  . OPEN REDUCTION INTERNAL FIXATION (ORIF) TIBIA/FIBULA FRACTURE Right 04/10/2020   Procedure: OPEN REDUCTION INTERNAL FIXATION (ORIF) TIBIA/FIBULA FRACTURE;  Surgeon: Shona Needles, MD;  Location: [place];  Service: Orthopedics;  Laterality: Right;  . ROBOT ASSISTED MYOMECTOMY N/A 07/22/2019   Procedure: XI ROBOTIC ASSISTED LAPAROSCOPIC RADICAL UPPER VAGINECTOMY, LEFT SALPINECTOMY, RIGHT SALPINGOOOPHERECTOMY;  Surgeon: Everitt Amber, MD;  [facility];  Service: Gynecology;  Laterality: N/A;  . VAGINAL HYSTERECTOMY N/A 03/11/2017   Procedure: HYSTERECTOMY VAGINAL WITH MORCELLATION;  Surgeon: Chancy Milroy, MD;  Location: [facility];  Service: Gynecology;  Laterality: N/A;    There were no vitals filed for this visit.   Subjective Assessment - 10/02/20 1504    Subjective Pt reports having 10/10 pain in R leg but describes it as just really bad pain but not the worst pain ever.    How long can you walk comfortably? 10 min or so out of boot    Patient Stated Goals decrease pain, get out boot, walk without AD (PLOF)    Currently in Pain? Yes    Pain Score 10-Worst pain ever  Pain Location Leg    Pain Orientation Right    Pain Descriptors / Indicators Sore    Pain Type Chronic pain    Pain Onset More than a month ago              [facility] PT Assessment - 10/02/20 0001      Assessment   Medical Diagnosis s/p Rt tib-fib ORIF    Referring Provider (PT) Vivien Rota                         [facility] Adult PT Treatment/Exercise - 10/02/20 0001      Ankle Exercises: Aerobic   Nustep L6 x 5 min LE only      Ankle Exercises: Standing   Heel Raises Both;20 reps   in parallel bars   Other Standing Ankle Exercises Step ups 4" in parallel bars x 20 with frequent  breaks due to fatigue      Ankle Exercises: Seated   Other Seated Ankle Exercises Seated LAQ and alternating marches x 20 each                  PT Education - 10/02/20 1816    Education Details Cues for pt to lock rollator prior to transfers; monitored rest breaks between interventions/sets    Person(s) Educated Patient    Methods Explanation    Comprehension Verbalized understanding;Need further instruction            PT Short Term Goals - 08/22/20 1554      PT SHORT TERM GOAL #1   Title ankle DF to 5 deg      PT SHORT TERM GOAL #2   Title pt will demo full weight acceptance into Rt foot in static stance    Baseline able to demo full weight acceptance    Status Achieved             PT Long Term Goals - 08/22/20 1740      PT LONG TERM GOAL #1   Title pt will be able to ambulate safely around the clinic without AD    Baseline full time use of rollator at eval    Time 8   POC 6 weeks, incr time to accommodate MCD auth   Period Weeks    Status New    Target Date 10/17/20      PT LONG TERM GOAL #2   Title pain with ADLs <=2/10    Baseline 3/10 at rest using boot and AD at eval    Time 8    Period Weeks    Status New    Target Date 10/17/20      PT LONG TERM GOAL #3   Title pt will ambulate in the community using LRAD    Baseline rollator at eval, PLOF was without AD, will determine need for AD based on balance and safety    Time 8    Period Weeks    Status New    Target Date 10/17/20      PT LONG TERM GOAL #4   Title pt will demo SLS comfortably for at least 5s without UE assist to demo full weight acceptance as well as balance control for safety    Baseline able to accept weight on Rt LE today but unable to demo controlled SLS    Time 8    Period Weeks    Status New    Target Date 10/17/20  Plan - 10/02/20 1818    Clinical Impression Statement Patient reports arriving late to today's session due to transportation.  Treatment session limited due to pt arriving late. Continued practicing step ups on 4" steps today with frequent seated rest breaks after every few repetitions. She expresses wanting to practice stepping up onto 4" step more prior to progressing to stairs.    Personal Factors and Comorbidities Fitness;Time since onset of injury/illness/exacerbation;Comorbidity 1;Education    Comorbidities mental disorder    Examination-Activity Limitations Sit;Squat;Stairs;Stand;Transfers;Locomotion Level    Stability/Clinical Decision Making Evolving/Moderate complexity    Rehab Potential Good    PT Frequency 2x / week    PT Duration 6 weeks    PT Treatment/Interventions ADLs/Self Care Home Management;Cryotherapy;Electrical Stimulation;Gait training;Neuromuscular re-education;Therapeutic exercise;Therapeutic activities;Functional mobility training;Stair training;Patient/family education;Manual techniques;Taping;Passive range of motion;Scar mobilization;Vasopneumatic Device    PT Next Visit Plan stairs, gait training without rollator    PT Home Exercise Plan [place] and Agree with Plan of Care Patient           Patient will benefit from skilled therapeutic intervention in order to improve the following deficits and impairments:  Abnormal gait,Decreased range of motion,Difficulty walking,Decreased activity tolerance,Pain,Improper body mechanics,Impaired flexibility,Decreased strength  Visit Diagnosis: Status post open reduction with internal fixation (ORIF) of fracture of ankle  Pain in right ankle and joints of right foot  Difficulty in walking, not elsewhere classified  Localized edema     Problem List Patient Active Problem List   Diagnosis Date Noted  . Acute blood loss anemia   . Leukopenia   . Vitamin D deficiency   . Acute lower UTI   . Postoperative pain   . Tibia/fibula fracture, right, sequela 04/15/2020  . Closed fracture of medial malleolus of right ankle 04/10/2020   . Open fracture of distal end of fibula and tibia, right, type I or II, initial encounter 04/09/2020  . Vaginal cancer ([place]) 07/22/2019  . Morbid obesity with BMI of 40.0-44.9, adult ([place]) 06/11/2019  . 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 Visit for routine gyn exam 04/21/2019  . Vaginal atrophy 04/21/2019  . Painful lumpy right breast 07/27/2016  . Urinary tract infection 07/01/2015  . Diastolic dysfunction 27/25/3664  . Constipation 05/12/2015  . ESBL (extended spectrum beta-lactamase) producing bacteria infection 03/17/2015  . Diarrhea 03/03/2015  . Weakness 03/02/2015  . 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 Anxiety state 02/20/2015  . Claustrophobia 02/20/2015  . Transaminitis 02/18/2015  . Chest pain 02/18/2015  . Abdominal pain   . Dizziness   . Hypotension 01/04/2015  . Bipolar disorder ([place]) 06/29/2012     Haydee Monica, PT, DPT 10/02/20 6:26 PM  [facility] [address] [place], [place], [address] Phone: [phone]   Fax:  [phone]  Name: Jordan [name] MRN: [id number] Date of Birth: 04/11/1966

## 2020-10-03 ENCOUNTER — Telehealth: Payer: Self-pay | Admitting: *Deleted

## 2020-10-03 ENCOUNTER — Telehealth: Payer: Self-pay | Admitting: Oncology

## 2020-10-03 NOTE — Telephone Encounter (Signed)
Returned the patient's call and explained that Dr Denman George does recommend the COVID booster.

## 2020-10-03 NOTE — Telephone Encounter (Signed)
Jordan [name] called and said she doesn't have transportation for her 10/16/20 appointment for outpatient rehab.  She is wondering if she can use the transportation program for this appointment. Advised Toriann I will check with the transportation department and call her back.

## 2020-10-04 ENCOUNTER — Ambulatory Visit: Payer: Medicaid Other

## 2020-10-04 NOTE — Telephone Encounter (Signed)
Left a message for Jordan [name] that transportation has been arranged for 10/16/20.

## 2020-10-05 ENCOUNTER — Encounter: Payer: Self-pay | Admitting: General Practice

## 2020-10-05 NOTE — Progress Notes (Signed)
[place] Spiritual Care Note  Met with Jordan [name] for an hour-long Spiritual Care appointment in my office. She processed frustration with a medical condition and with family dynamics. At the end of the conversation, she proclaimed, "I am a strong woman.Marland KitchenMarland KitchenI am powerful..I am good!" This is significant insight and celebratory self-awareness, reframing her many life hardships as challenges that she has come through with grace and growth. Jordan [name] also noted, "I always feel better, less stressed, after I meet with you or [Counseling Intern Jordan [name]]]." We have a follow-up appointment on December 21 at 1:00pm.   [address] [name], [facility] Pager [phone] Voicemail [phone]

## 2020-10-09 ENCOUNTER — Other Ambulatory Visit: Payer: Self-pay

## 2020-10-09 ENCOUNTER — Ambulatory Visit: Payer: Medicaid Other

## 2020-10-09 ENCOUNTER — Encounter: Payer: Self-pay | Admitting: General Practice

## 2020-10-09 DIAGNOSIS — M25571 Pain in right ankle and joints of right foot: Secondary | ICD-10-CM

## 2020-10-09 DIAGNOSIS — R262 Difficulty in walking, not elsewhere classified: Secondary | ICD-10-CM

## 2020-10-09 DIAGNOSIS — Z8781 Personal history of (healed) traumatic fracture: Secondary | ICD-10-CM

## 2020-10-09 DIAGNOSIS — Z9889 Other specified postprocedural states: Secondary | ICD-10-CM

## 2020-10-09 DIAGNOSIS — R600 Localized edema: Secondary | ICD-10-CM

## 2020-10-09 NOTE — Progress Notes (Signed)
[place] Spiritual Care Note  Received and returned call from Aira seeking emotional support related to a family conflict. Meeyah talked herself through her frustration and into a place of recognizing how much work she has put into her spiritual and emotional growth over time. She notes that she "always feels better" after talking to Counseling Intern [name] or chaplain. We have a 1:00 appointment already scheduled for tomorrow afternoon.   [place], [place], [facility] Pager [phone] Voicemail [phone]

## 2020-10-09 NOTE — Therapy (Signed)
[place], [place], [address] Phone: [phone]   Fax:  [phone]  Physical Therapy Treatment  Patient Details  Name: Jordan [name] MRN: [id number] Date of Birth: 1966-06-21 Referring Provider (PT): Delray Alt, PA-C   Encounter Date: 10/09/2020   PT End of Session - 10/09/20 1445    Visit Number 10    Number of Visits 16    Date for PT Re-Evaluation 10/17/20    Authorization Type MCD    Authorization Time Period 09/06/2020 - 10/17/2020    Authorization - Visit Number 3    Authorization - Number of Visits 12    PT Start Time 9485    PT Stop Time 1526    PT Time Calculation (min) 41 min    Equipment Utilized During Treatment Gait belt   gait belt during step up and over and gait training   Activity Tolerance Patient tolerated treatment well    Behavior During Therapy [facility] for tasks assessed/performed           Past Medical History:  Diagnosis Date  . Alcohol abuse   . Anemia    patient denies  . Bipolar 1 disorder (HCC)    No medications currently  . CAP (community acquired pneumonia) 03/17/2015  . 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 Mental disorder   . Obesity   . PICC line infection 05/17/2015  . Sepsis due to Gram negative bacteria (MDR E Coli) 02/18/2015  . 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 UTI (lower urinary tract infection)   . Vaginal Pap smear, abnormal     Past Surgical History:  Procedure Laterality Date  . ABDOMINAL HYSTERECTOMY    . CERVICAL CONIZATION W/BX N/A 07/01/2016   Procedure: CONIZATION CERVIX WITH BIOPSY;  Surgeon: Chancy Milroy, MD;  Location: [place] [facility];  Service: Gynecology;  Laterality: N/A;  . CHOLECYSTECTOMY    . EXTERNAL FIXATION LEG Right 04/09/2020   Procedure: EXTERNAL FIXATION LEG;  Surgeon: Hiram Gash, MD;  Location: [place];  Service: Orthopedics;  Laterality: Right;  . HYSTEROSCOPY WITH D & C N/A 01/25/2016    Procedure: DILATATION AND CURETTAGE /HYSTEROSCOPY;  Surgeon: Mora Bellman, MD;  Location: [facility] [facility];  Service: Gynecology;  Laterality: N/A;  . LYMPH NODE BIOPSY Bilateral 07/22/2019   Procedure: LYMPH NODE BIOPSY;  Surgeon: Everitt Amber, MD;  [facility];  Service: Gynecology;  Laterality: Bilateral;  . OPEN REDUCTION INTERNAL FIXATION (ORIF) TIBIA/FIBULA FRACTURE Right 04/10/2020   Procedure: OPEN REDUCTION INTERNAL FIXATION (ORIF) TIBIA/FIBULA FRACTURE;  Surgeon: Shona Needles, MD;  Location: [place];  Service: Orthopedics;  Laterality: Right;  . ROBOT ASSISTED MYOMECTOMY N/A 07/22/2019   Procedure: XI ROBOTIC ASSISTED LAPAROSCOPIC RADICAL UPPER VAGINECTOMY, LEFT SALPINECTOMY, RIGHT SALPINGOOOPHERECTOMY;  Surgeon: Everitt Amber, MD;  [facility];  Service: Gynecology;  Laterality: N/A;  . VAGINAL HYSTERECTOMY N/A 03/11/2017   Procedure: HYSTERECTOMY VAGINAL WITH MORCELLATION;  Surgeon: Chancy Milroy, MD;  Location: [facility];  Service: Gynecology;  Laterality: N/A;    There were no vitals filed for this visit.   Subjective Assessment - 10/09/20 1907    Subjective Pt responds "good" when asked about pain upon arrival and later mentions having R lower leg pain.    How long can you walk comfortably? 10 min or so out of boot    Patient Stated Goals decrease pain, get out boot, walk without AD (PLOF)    Currently in Pain?  No/denies    Pain Score 0-No pain   did not provide numerical value but mentions "bad pain"   Pain Location Leg    Pain Onset More than a month ago              [facility] PT Assessment - 10/09/20 0001      Assessment   Medical Diagnosis s/p Rt tib-fib ORIF    Referring Provider (PT) Vivien Rota                         [facility] Adult PT Treatment/Exercise - 10/09/20 0001      Transfers   Five time sit to stand comments  20.46 seconds from low mat with BUE on thighs      Ambulation/Gait   Ambulation/Gait Yes    Ambulation/Gait Assistance 4:  Min guard    Ambulation Distance (Feet) --   2 x 55 feet with 3 minute seated rest break between trips; then 1 x 110 feet   Assistive device None    Gait Pattern Step-through pattern;Lateral trunk lean to right;Lateral trunk lean to left    Ambulation Surface Level;Indoor      Self-Care   Self-Care Other Self-Care Comments    Other Self-Care Comments  Cues to reach back for stable surface and to lock rollator prior to transfers; monitored rest breaks between interventions. Reviewed and updated HEP. Pt reports losing her HEP paper, so she was provided with a new printout and green theraband.      Ankle Exercises: Standing   Heel Raises Both;20 reps   in parallel bars   Other Standing Ankle Exercises Step up and over 4" step x 15 with cues for controlled descent    Other Standing Ankle Exercises Standing hip ABD with UE support in parallel bars and pt looking in mirror for cues to maintain upright posture      Ankle Exercises: Supine   T-Band 4-way with green band x 20 each   pt seated in chair     Ankle Exercises: Aerobic   Nustep L7 x 5 min LE only                  PT Education - 10/09/20 1909    Education Details Cues to reach back for stable surface and to lock rollator prior to transfers; monitored rest breaks between interventions. Reviewed and updated HEP. Pt reports losing her HEP paper, so she was provided with a new printout and green theraband.    Person(s) Educated Patient    Methods Explanation;Demonstration    Comprehension Verbalized understanding;Returned demonstration;Need further instruction            PT Short Term Goals - 10/09/20 1916      PT SHORT TERM GOAL #1   Title ankle DF to 5 deg    Baseline 10 deg 09/06/2020    Status Achieved      PT SHORT TERM GOAL #2   Title pt will demo full weight acceptance into Rt foot in static stance    Baseline able to demo full weight acceptance    Status Achieved             PT Long Term Goals - 08/22/20  1740      PT LONG TERM GOAL #1   Title pt will be able to ambulate safely around the clinic without AD    Baseline full time use of rollator at eval  Time 8   POC 6 weeks, incr time to accommodate MCD auth   Period Weeks    Status New    Target Date 10/17/20      PT LONG TERM GOAL #2   Title pain with ADLs <=2/10    Baseline 3/10 at rest using boot and AD at eval    Time 8    Period Weeks    Status New    Target Date 10/17/20      PT LONG TERM GOAL #3   Title pt will ambulate in the community using LRAD    Baseline rollator at eval, PLOF was without AD, will determine need for AD based on balance and safety    Time 8    Period Weeks    Status New    Target Date 10/17/20      PT LONG TERM GOAL #4   Title pt will demo SLS comfortably for at least 5s without UE assist to demo full weight acceptance as well as balance control for safety    Baseline able to accept weight on Rt LE today but unable to demo controlled SLS    Time 8    Period Weeks    Status New    Target Date 10/17/20                 Plan - 10/09/20 1911    Clinical Impression Statement Patient tolerated session well but was distracted for part of session due to not knowing if she had contacted transportation for a doctor's appointment tomorrow and for PT session Wednesday. Pt insisted on calling during PT session but performed interventions while on hold. Progressed step ups on 4" to step up and over on 4" with cues for controlled descent within parallel bars and with gait belt applied. Pt reports wanting to continue practicing step up and over prior to progressing to stair negotiation. Pt had fair tolerance with gait training without rollator but demonstrates excessive lateral trunk lean and trendelenburg gait.    Personal Factors and Comorbidities Fitness;Time since onset of injury/illness/exacerbation;Comorbidity 1;Education    Comorbidities mental disorder    Examination-Activity Limitations  Sit;Squat;Stairs;Stand;Transfers;Locomotion Level    Stability/Clinical Decision Making Evolving/Moderate complexity    Rehab Potential Good    PT Frequency 2x / week    PT Duration 6 weeks    PT Treatment/Interventions ADLs/Self Care Home Management;Cryotherapy;Electrical Stimulation;Gait training;Neuromuscular re-education;Therapeutic exercise;Therapeutic activities;Functional mobility training;Stair training;Patient/family education;Manual techniques;Taping;Passive range of motion;Scar mobilization;Vasopneumatic Device    PT Next Visit Plan stairs/step up and over, gait training without rollator, assess ankle AROM and BLE strength    PT Home Exercise Plan [email or web address]    Consulted and Agree with Plan of Care Patient           Patient will benefit from skilled therapeutic intervention in order to improve the following deficits and impairments:  Abnormal gait,Decreased range of motion,Difficulty walking,Decreased activity tolerance,Pain,Improper body mechanics,Impaired flexibility,Decreased strength  Visit Diagnosis: Status post open reduction with internal fixation (ORIF) of fracture of ankle  Pain in right ankle and joints of right foot  Difficulty in walking, not elsewhere classified  Localized edema     Problem List Patient Active Problem List   Diagnosis Date Noted  . Acute blood loss anemia   . Leukopenia   . Vitamin D deficiency   . Acute lower UTI   . Postoperative pain   . Tibia/fibula fracture, right, sequela 04/15/2020  . Closed fracture of medial malleolus of right ankle  04/10/2020  . Open fracture of distal end of fibula and tibia, right, type I or II, initial encounter 04/09/2020  . Vaginal cancer ([place]) 07/22/2019  . Morbid obesity with BMI of 40.0-44.9, adult ([place]) 06/11/2019  . 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 Visit for routine gyn exam 04/21/2019  . Vaginal atrophy 04/21/2019  . Painful lumpy right breast 07/27/2016  . Urinary tract  infection 07/01/2015  . Diastolic dysfunction 34/12/7094  . Constipation 05/12/2015  . ESBL (extended spectrum beta-lactamase) producing bacteria infection 03/17/2015  . Diarrhea 03/03/2015  . Weakness 03/02/2015  . 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 Anxiety state 02/20/2015  . Claustrophobia 02/20/2015  . Transaminitis 02/18/2015  . Chest pain 02/18/2015  . Abdominal pain   . Dizziness   . Hypotension 01/04/2015  . Bipolar disorder ([place]) 06/29/2012     Haydee Monica, PT, DPT 10/09/20 7:23 PM  [facility] [address] [place], [place], [address] Phone: [phone]   Fax:  [phone]  Name: Livana Yerian MRN: [id number] Date of Birth: May 15, 1966

## 2020-10-11 ENCOUNTER — Telehealth: Payer: Self-pay | Admitting: Oncology

## 2020-10-11 ENCOUNTER — Ambulatory Visit: Payer: Medicaid Other

## 2020-10-11 NOTE — Telephone Encounter (Signed)
Rindi called and wanted to talk about her family issues.  Actively listened and provided support.

## 2020-10-16 ENCOUNTER — Encounter: Payer: Self-pay | Admitting: General Practice

## 2020-10-16 ENCOUNTER — Other Ambulatory Visit: Payer: Self-pay

## 2020-10-16 ENCOUNTER — Ambulatory Visit: Payer: Medicaid Other | Admitting: Physical Therapy

## 2020-10-16 ENCOUNTER — Encounter: Payer: Self-pay | Admitting: Physical Therapy

## 2020-10-16 DIAGNOSIS — M25571 Pain in right ankle and joints of right foot: Secondary | ICD-10-CM

## 2020-10-16 DIAGNOSIS — Z8781 Personal history of (healed) traumatic fracture: Secondary | ICD-10-CM

## 2020-10-16 DIAGNOSIS — Z9889 Other specified postprocedural states: Secondary | ICD-10-CM | POA: Diagnosis not present

## 2020-10-16 DIAGNOSIS — R262 Difficulty in walking, not elsewhere classified: Secondary | ICD-10-CM

## 2020-10-16 DIAGNOSIS — R600 Localized edema: Secondary | ICD-10-CM

## 2020-10-16 NOTE — Progress Notes (Signed)
[facility] Spiritual Care Note  Returned calls from Jelisha at times inconvenient to her, so she plans to phone again tomorrow. She is processing some family and communication stress, which stirs up her grief about not being supported well through her cancer.   [address] [name], [place], [facility] Pager [phone] Voicemail [phone]

## 2020-10-16 NOTE — Patient Instructions (Signed)
Access Code: [email or web address] URL: [email or web address] Date: 10/16/2020 Prepared by: Rosana Hoes  Exercises Long Sitting Calf Stretch with Strap - 1-2 x daily - 7 x weekly - 2 sets - 30s hold Standing Gastroc Stretch at Counter - 1-2 x daily - 7 x weekly - 2 sets - 20 seconds hold Seated Ankle Plantar Flexion with Resistance Loop - 1-2 x daily - 7 x weekly - 2 sets - 20 reps Seated Ankle Eversion with Resistance - 1-2 x daily - 7 x weekly - 2 sets - 20 reps Ankle Inversion with Resistance - 1-2 x daily - 7 x weekly - 2 sets - 20 reps Seated Heel Toe Raises - 1-2 x daily - 7 x weekly - 2 sets - 20 reps Heel Toe Raises with Counter Support - 1-2 x daily - 7 x weekly - 3 sets - 10 reps Standing Hip Abduction with Counter Support - 1-2 x daily - 7 x weekly - 2 sets - 10 reps Standing Hip Extension with Counter Support - 1-2 x daily - 7 x weekly - 3 sets - 10 reps Standing March with Counter Support - 1-2 x daily - 7 x weekly - 2 sets - 10 reps Mini Squat with Counter Support - 1-2 x daily - 7 x weekly - 2 sets - 10 reps

## 2020-10-17 NOTE — Therapy (Signed)
[place], [place], [address] Phone: [phone]   Fax:  [phone]  Physical Therapy Treatment / ERO  Patient Details  Name: Jordan [name] MRN: [id number] Date of Birth: April 24, 1966 Referring Provider (PT): Delray Alt, PA-C   Encounter Date: 10/16/2020   PT End of Session - 10/16/20 1539    Visit Number 11    Number of Visits 15    Date for PT Re-Evaluation 11/20/20   5 weeks to allow for scheduling   Authorization Type MCD    Authorization Time Period 09/06/2020 - 10/17/2020    Authorization - Number of Visits 12    PT Start Time 2637    PT Stop Time 1615    PT Time Calculation (min) 45 min    Activity Tolerance Patient tolerated treatment well    Behavior During Therapy [facility] for tasks assessed/performed           Past Medical History:  Diagnosis Date  . Alcohol abuse   . Anemia    patient denies  . Bipolar 1 disorder (HCC)    No medications currently  . CAP (community acquired pneumonia) 03/17/2015  . 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 Mental disorder   . Obesity   . PICC line infection 05/17/2015  . Sepsis due to Gram negative bacteria (MDR E Coli) 02/18/2015  . 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 UTI (lower urinary tract infection)   . Vaginal Pap smear, abnormal     Past Surgical History:  Procedure Laterality Date  . ABDOMINAL HYSTERECTOMY    . CERVICAL CONIZATION W/BX N/A 07/01/2016   Procedure: CONIZATION CERVIX WITH BIOPSY;  Surgeon: Chancy Milroy, MD;  Location: [facility];  Service: Gynecology;  Laterality: N/A;  . CHOLECYSTECTOMY    . EXTERNAL FIXATION LEG Right 04/09/2020   Procedure: EXTERNAL FIXATION LEG;  Surgeon: Hiram Gash, MD;  Location: [facility];  Service: Orthopedics;  Laterality: Right;  . HYSTEROSCOPY WITH D & C N/A 01/25/2016   Procedure: DILATATION AND CURETTAGE /HYSTEROSCOPY;  Surgeon: Mora Bellman, MD;  Location: [facility];   Service: Gynecology;  Laterality: N/A;  . LYMPH NODE BIOPSY Bilateral 07/22/2019   Procedure: LYMPH NODE BIOPSY;  Surgeon: Everitt Amber, MD;  Location: [facility];  Service: Gynecology;  Laterality: Bilateral;  . OPEN REDUCTION INTERNAL FIXATION (ORIF) TIBIA/FIBULA FRACTURE Right 04/10/2020   Procedure: OPEN REDUCTION INTERNAL FIXATION (ORIF) TIBIA/FIBULA FRACTURE;  Surgeon: Shona Needles, MD;  Location: [facility];  Service: Orthopedics;  Laterality: Right;  . ROBOT ASSISTED MYOMECTOMY N/A 07/22/2019   Procedure: XI ROBOTIC ASSISTED LAPAROSCOPIC RADICAL UPPER VAGINECTOMY, LEFT SALPINECTOMY, RIGHT SALPINGOOOPHERECTOMY;  Surgeon: Everitt Amber, MD;  Location: [facility];  Service: Gynecology;  Laterality: N/A;  . VAGINAL HYSTERECTOMY N/A 03/11/2017   Procedure: HYSTERECTOMY VAGINAL WITH MORCELLATION;  Surgeon: Chancy Milroy, MD;  Location: [facility];  Service: Gynecology;  Laterality: N/A;    There were no vitals filed for this visit.   Subjective Assessment - 10/16/20 1534    Subjective Patient report she is still hurting in the leg. States that nothing has really changed. She really wants to work on stairs so she can improving getting in/out of her home. Currently she is only able to go out one door and feels this is unsafe in case she needs to get out quickly.    How long can you stand comfortably? 5 minutes    How long can you walk  comfortably? 10 minutes    Patient Stated Goals decrease pain, get out boot, walk without AD (PLOF)    Currently in Pain? Yes    Pain Score 8     Pain Location Leg    Pain Orientation Right;Lower    Pain Descriptors / Indicators Sore;Aching    Pain Type Chronic pain    Pain Onset More than a month ago    Pain Frequency Intermittent    Aggravating Factors  Walking, standing, constant    Pain Relieving Factors Nothing              [facility] PT Assessment - 10/17/20 0001      Assessment   Medical Diagnosis s/p Rt tib-fib ORIF    Referring Provider (PT) Despina Hidden,  PA-C    Onset Date/Surgical Date 04/10/20      Precautions   Precautions None      Restrictions   Weight Bearing Restrictions No      Balance Screen   How many times? No falls since starting therapy    Has the patient had a decrease in activity level because of a fear of falling?  Yes    Is the patient reluctant to leave their home because of a fear of falling?  Yes      Prior Function   Level of Independence Independent with household mobility with device;Independent with community mobility with device      Observation/Other Assessments   Observations Patient appears in no apparent distress    Focus on Therapeutic Outcomes (FOTO)  NA MCD      ROM / Strength   AROM / PROM / Strength AROM;Strength      AROM   Right Ankle Dorsiflexion 10    Right Ankle Plantar Flexion 60    Right Ankle Inversion 35    Right Ankle Eversion 20      Strength   Strength Assessment Site Ankle    Right/Left Ankle Right    Right Ankle Dorsiflexion 5/5    Right Ankle Plantar Flexion 4/5    Right Ankle Inversion 5/5    Right Ankle Eversion 4+/5      Transfers   Five time sit to stand comments  20.46 seconds from low mat with BUE on thighs      Ambulation/Gait   Ambulation/Gait Yes    Ambulation/Gait Assistance 5: Supervision    Ambulation Distance (Feet) --    Assistive device None    Gait Pattern Step-through pattern    Stairs Yes    Stairs Assistance 5: Supervision    Stair Management Technique Two rails   heavy reliance on bilateral railing for support   Number of Stairs 5    Height of Stairs 4    Gait Comments Slightly antalgic on right, trendelenburg bilaterally                         [facility] Adult PT Treatment/Exercise - 10/17/20 0001      Self-Care   Self-Care Other Self-Care Comments    Other Self-Care Comments  POC, stair negotiation, importance of compliance and progressing strengthening at home      Exercises   Exercises Ankle      Ankle Exercises:  Standing   Heel Raises 20 reps   2 sets   Toe Raise 20 reps   2 sets   Other Standing Ankle Exercises Standing hip abduction, extension, marching 2 x 10 each  Ankle Exercises: Stretches   Gastroc Stretch 30 seconds    Gastroc Stretch Limitations standing at [employer] 2 reps;30 seconds      Ankle Exercises: Aerobic   Nustep L7 x 5 min LE only      Ankle Exercises: Seated   Heel Raises 20 reps    Toe Raise 20 reps    Other Seated Ankle Exercises Reviewed banded ankle strengtheing with red band for HEP                  PT Education - 10/16/20 1537    Education Details POC, HEP, see self-care    Person(s) Educated Patient    Methods Explanation;Demonstration;Verbal cues;Handout    Comprehension Verbalized understanding;Need further instruction;Returned demonstration;Verbal cues required            PT Short Term Goals - 10/09/20 1916      PT SHORT TERM GOAL #1   Title ankle DF to 5 deg    Baseline 10 deg 09/06/2020    Status Achieved      PT SHORT TERM GOAL #2   Title pt will demo full weight acceptance into Rt foot in static stance    Baseline able to demo full weight acceptance    Status Achieved             PT Long Term Goals - 10/17/20 0258      PT LONG TERM GOAL #1   Title Patient will be able to ambulate >/= 500 ft safely using LRAD    Baseline Patient using rollator full-time, currently only able to ambulate approximately 300 feet    Time 4   POC 6 weeks, incr time to accommodate MCD auth   Period Weeks    Status Revised    Target Date 11/20/20      PT LONG TERM GOAL #2   Title Patient will report pain with ADLs <=2/10    Baseline Patient continues to report high level of pain with activity    Time 4    Period Weeks    Status On-going    Target Date 11/20/20      PT LONG TERM GOAL #3   Title pt will ambulate in the community using LRAD    Baseline Patient using rollator full-time, continues to be limited with  community ambulation    Time 4    Period Weeks    Status On-going    Target Date 11/20/20      PT LONG TERM GOAL #4   Title pt will demo SLS comfortably for at least 5s without UE assist to demo full weight acceptance as well as balance control for safety    Baseline Patient unable to perform SLS on right    Time 4    Period Weeks    Status On-going    Target Date 11/20/20      PT LONG TERM GOAL #5   Title Patient will be able to negotiate 4 standard stairs using single railing to improve ability to enter and leave home safely    Baseline Patient only able to negotiate small stairs using bilateral railings    Time 4    Period Weeks    Status New    Target Date 11/20/20                 Plan - 10/16/20 1554    Clinical Impression Statement Patient is progressing toward established goals but continues to  be limited with functional activities such as stair negotiation which creates a safetly issue in case patient is required to leaver he home quickly. She was able to negotiate 5 small 4" stairs with heavy reliance on bilateral UE support with supervision. Patient has demonstates improve ankle motion and strength and her home exercise program was progressed to focus on ankle and general BLE strengthening to improve functional mobility. Patient would benefit from continued skilled PT to progress her strength and balance in order to improve stair negotiation and maximize her functional level.    Examination-Activity Limitations Sit;Squat;Stairs;Stand;Transfers;Locomotion Level    PT Frequency 1x / week    PT Duration 4 weeks    PT Treatment/Interventions ADLs/Self Care Home Management;Cryotherapy;Electrical Stimulation;Gait training;Neuromuscular re-education;Therapeutic exercise;Therapeutic activities;Functional mobility training;Stair training;Patient/family education;Manual techniques;Taping;Passive range of motion;Scar mobilization;Vasopneumatic Device    PT Next Visit Plan Focus on  stairs and general LE strengthening, balance training    PT Home Exercise Plan [email or web address]    Consulted and Agree with Plan of Care Patient           Patient will benefit from skilled therapeutic intervention in order to improve the following deficits and impairments:  Abnormal gait,Decreased range of motion,Difficulty walking,Decreased activity tolerance,Pain,Improper body mechanics,Impaired flexibility,Decreased strength  Visit Diagnosis: Status post open reduction with internal fixation (ORIF) of fracture of ankle  Pain in right ankle and joints of right foot  Difficulty in walking, not elsewhere classified  Localized edema    Check all possible CPT codes: 67209- Therapeutic Exercise, [phone]- Neuro Re-education, [phone] - Gait Training, 97140 - Manual Therapy, 97530 - Therapeutic Activities and 97535 - Self Care         Problem List Patient Active Problem List   Diagnosis Date Noted  . Acute blood loss anemia   . Leukopenia   . Vitamin D deficiency   . Acute lower UTI   . Postoperative pain   . Tibia/fibula fracture, right, sequela 04/15/2020  . Closed fracture of medial malleolus of right ankle 04/10/2020  . Open fracture of distal end of fibula and tibia, right, type I or II, initial encounter 04/09/2020  . Vaginal cancer (HCC) 07/22/2019  . Morbid obesity with BMI of 40.0-44.9, adult (HCC) 06/11/2019  . 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HCC) 05/28/2019  . Visit for routine gyn exam 04/21/2019  . Vaginal atrophy 04/21/2019  . Painful lumpy right breast 07/27/2016  . Urinary tract infection 07/01/2015  . Diastolic dysfunction 07/01/2015  . Constipation 05/12/2015  . ESBL (extended spectrum beta-lactamase) producing bacteria infection 03/17/2015  . Diarrhea 03/03/2015  . Weakness 03/02/2015  . 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 Anxiety state 02/20/2015  . Claustrophobia 02/20/2015  . Transaminitis 02/18/2015  . Chest pain 02/18/2015  . Abdominal pain   . Dizziness    . Hypotension 01/04/2015  . Bipolar disorder (HCC) 06/29/2012    Rosana Hoes, PT, DPT, LAT, ATC 10/17/20  8:28 AM Phone: [phone] Fax: [phone]   [facility] [address] [place], [place], [address] Phone: [phone]   Fax:  [phone]  Name: Jordan [name] MRN: [id number] Date of Birth: 01/03/1966

## 2020-10-18 ENCOUNTER — Encounter: Payer: Self-pay | Admitting: General Practice

## 2020-10-18 NOTE — Progress Notes (Signed)
Jordan [name] Note  Met with Jordan [name] in my office for an [place] visit for interim emotional support while Counseling Intern [name] is on extended winter break. Jordan [name] is working through grief related to disappointment and family conflict and working to formulate constructive conversational responses to improve communication and preserve relationship. Provided empathic reflective listening, emotional support, and affirmation of strengths. We plan to follow up on Thursday, January 6 at 3:00pm.   Pauline Good, [facility] Pager [phone] Voicemail [phone]

## 2020-10-24 ENCOUNTER — Encounter: Payer: Self-pay | Admitting: General Practice

## 2020-10-24 NOTE — Progress Notes (Signed)
[place] Spiritual Care Note  Met with Zairah for extra emotional support per her request. She is working hard to be able to maneuver stairs again through PT, to navigate difficult family relationships while retaining her composure and her sense of personal integrity, and to utilize professional listening as a means to processing and releasing distress. Provided empathic reflective listening, emotional support, and affirmation of strengths. We plan to follow up on Thursday, January 6 at 3:00pm.  Pauline Good, [facility] Pager [phone] Voicemail [phone]

## 2020-10-26 ENCOUNTER — Encounter: Payer: Self-pay | Admitting: General Practice

## 2020-10-26 NOTE — Progress Notes (Signed)
[place] Spiritual Care Note  Met with Jordan [name] to talk through her anxiety about her upcoming procedure and how she is coping. She is working to express her feelings and then return to a place of calm confidence. Per her request we will follow up Monday before she resumes her appointments with Counseling Intern [name].   [place], [place], [facility] Pager [phone] Voicemail [phone]

## 2020-10-30 ENCOUNTER — Encounter: Payer: Self-pay | Admitting: General Practice

## 2020-10-30 NOTE — Progress Notes (Signed)
[place] Spiritual Care Note  Met with Jordan [name] to process her anxiety about her upcoming surgery on Thursday and her coping tools for coping with the fear and distress. One notable challenge is that her boyfriend Jordan [name] is sick himself and so not available for the kind of connecting that Jordan [name] would treasure. Jordan [name] is primarily using tools that help her be present in the moment and interrupt/distract her worrying, such as prayer, cleaning her apartment, watching movies, and talking to people who are close to her. She also uses empowering mantras, such as "I'm going to come through this with flying colors," to help encourage herself.  Jordan [name] will resume her appointments with Counseling Intern Jordan [name] starting tomorrow, 1/11, at 1:00pm, a transition we have discussed in detail.   [place], [place], [facility] Pager [phone] Voicemail [phone]

## 2020-10-31 NOTE — Progress Notes (Signed)
[place] Patient and [facility] Counseling Note   Session covered a variety of topics, including patient's upcoming surgery, patient's relationship with her siblings, feeling disrespected/unheard, and the struggle of fitting in. Patient identified her desire to be "calm and collected" for the surgery. Patient asked the question "where do I fit in this picture?" and asked why she even tries. Seeing her brother get the familial support she always wanted brings up a lot of feelings for her. Patient was oriented times three and presented with a scared, angry, and hurt mood. Her affect was labile and she showed no signs of SI/HI/NSSI. The upcoming surgery brings up fear and anger for the patient, especially as she recalls mistreatment from her family. She feels betrayed because her brother gets support when he is giving up and she never got support when she was fighting cancer. Additionally, the patient continues to have thoughts that people do not respect her or listen to her, which results in hurtful feelings and reactive responses. Next session will focus on how the patient is feeling post-surgery.    [name] Counseling Intern

## 2020-11-03 ENCOUNTER — Encounter: Payer: Self-pay | Admitting: General Practice

## 2020-11-03 NOTE — Progress Notes (Signed)
[facility] Spiritual Care Note  Per request from Counseling Intern [name], phoned [place] to let her know that Raquel Sarna needs to postpone their 1/18 appointment and will call to reschedule.   [place], [place], [facility] Pager [phone] Voicemail [phone]

## 2020-11-06 ENCOUNTER — Ambulatory Visit: Payer: Medicaid Other

## 2020-11-07 ENCOUNTER — Encounter: Payer: Self-pay | Admitting: General Practice

## 2020-11-07 ENCOUNTER — Telehealth: Payer: Self-pay | Admitting: *Deleted

## 2020-11-07 NOTE — Progress Notes (Signed)
Jordan [name] Spiritual Care Note  Received several calls from [place] regarding distress about a procedure that she underwent in a different health system. Connected by phone to allow her to share and process her concerns and what would help repair the communication damage.  We are also developing a new strategy of having one scheduled phone call each week so that we can focus on what is most important. Our scheduled call for this week is Thursday 1/20 at [place], [place], [facility] Pager [phone] Voicemail [phone]

## 2020-11-07 NOTE — Telephone Encounter (Signed)
Returned patient's phone call, lvm for a return call 

## 2020-11-07 NOTE — Telephone Encounter (Signed)
Patient called to confirm appts with Dr Sondra Come and Dr Denman George. Patient given the appt for Dr Denman George over the phone and mailed calendar with appts. Patient needed to change her appt with Dr Sondra Come. Explained that I was unable to change Dr Clabe Seal appt but I would transfer to his office to change the appt. Explained once that appt is changed I would mail that appt to her also. Patient transferred to radiation.

## 2020-11-09 ENCOUNTER — Encounter: Payer: Self-pay | Admitting: General Practice

## 2020-11-09 ENCOUNTER — Telehealth: Payer: Self-pay

## 2020-11-09 NOTE — Telephone Encounter (Signed)
Confirmed she will be able to come to afternoon session.  [name] Counseling Intern

## 2020-11-09 NOTE — Telephone Encounter (Signed)
Called patient to check in about surgery outcomes and rescheduled missed session.   [name] Counseling Intern

## 2020-11-09 NOTE — Progress Notes (Signed)
[place] Patient and [facility], [place] Counseling Note   Patient arrived late to session. Patient discussed how her doctors mistreated and lied to her, which she believes will happen to them one day. Counselor identified themes of the patient feeling like others think she is stupid, and wondering why other people do not care or do the right things. Patient connected these thoughts to her brother and how her family seems to chose him over her. Patient wants to know where she "fits' into their life. Patient was oriented times three, in an angry mood, and had a labile affect. There were no signs of HI/SI/NSSI. Patient does not understand the ways that people communicate with her, so she has thoughts like "They think I'm stupid," "They are mistreating me," or "I am stupid." These thoughts bring up anger, hurt, sadness and more. These feelings result in the patient lashing out, further distancing herself from others. Next session will focus on discussing where to go from here.   [name] Counseling Intern

## 2020-11-09 NOTE — Progress Notes (Signed)
[place] Spiritual Care Note  Jordan [name] and I are shifting our scheduled phone call to once monthly in order for her to focus on her work with Counseling Intern [name]. Next scheduled call TBD due to limited time on today's call.   [place], [place], [facility] Pager [phone] Voicemail [phone]

## 2020-11-10 ENCOUNTER — Telehealth: Payer: Self-pay | Admitting: *Deleted

## 2020-11-10 NOTE — Telephone Encounter (Signed)
Called patient back and gave the appt for Dr Sondra Come. Mailed the calendar with appt

## 2020-11-10 NOTE — Telephone Encounter (Signed)
Patient called and wanted to move her radiation appt. Explained that I would call and get the appt moved and call her back. Patient also stated "I wanted to let Dr Denman George know my results from the other doctor were negative.

## 2020-11-13 ENCOUNTER — Other Ambulatory Visit: Payer: Self-pay

## 2020-11-13 ENCOUNTER — Ambulatory Visit: Payer: Medicaid Other | Attending: Student

## 2020-11-13 DIAGNOSIS — R262 Difficulty in walking, not elsewhere classified: Secondary | ICD-10-CM | POA: Insufficient documentation

## 2020-11-13 DIAGNOSIS — Z9889 Other specified postprocedural states: Secondary | ICD-10-CM | POA: Insufficient documentation

## 2020-11-13 DIAGNOSIS — R600 Localized edema: Secondary | ICD-10-CM | POA: Diagnosis present

## 2020-11-13 DIAGNOSIS — Z8781 Personal history of (healed) traumatic fracture: Secondary | ICD-10-CM | POA: Diagnosis present

## 2020-11-13 DIAGNOSIS — M25571 Pain in right ankle and joints of right foot: Secondary | ICD-10-CM | POA: Diagnosis present

## 2020-11-13 NOTE — Therapy (Signed)
[place], [place], [address] Phone: [phone]   Fax:  [phone]  Physical Therapy Treatment/Re-evaluation  Patient Details  Name: Jordan [name] MRN: [id number] Date of Birth: [birth year] Referring Provider (PT): Delray Alt, PA-C   Encounter Date: 11/13/2020   PT End of Session - 11/13/20 1343    Visit Number 12    Number of Visits 16    Date for PT Re-Evaluation 12/16/20    Authorization Type MCD - submitted for re-auth 11/13/2020    Authorization Time Period 11/07/2019 - 11/26/2020    Authorization - Visit Number 1    Authorization - Number of Visits 3    PT Start Time 1400    PT Stop Time 1441    PT Time Calculation (min) 41 min    Activity Tolerance Patient tolerated treatment well    Behavior During Therapy [facility] for tasks assessed/performed           Past Medical History:  Diagnosis Date  . Alcohol abuse   . Anemia    patient denies  . Bipolar 1 disorder (HCC)    No medications currently  . CAP (community acquired pneumonia) 03/17/2015  . 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 Mental disorder   . Obesity   . PICC line infection 05/17/2015  . Sepsis due to Gram negative bacteria (MDR E Coli) 02/18/2015  . 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 UTI (lower urinary tract infection)   . Vaginal Pap smear, abnormal     Past Surgical History:  Procedure Laterality Date  . ABDOMINAL HYSTERECTOMY    . CERVICAL CONIZATION W/BX N/A 07/01/2016   Procedure: CONIZATION CERVIX WITH BIOPSY;  Surgeon: Chancy Milroy, MD;  Location: [facility];  Service: Gynecology;  Laterality: N/A;  . CHOLECYSTECTOMY    . EXTERNAL FIXATION LEG Right 04/09/2020   Procedure: EXTERNAL FIXATION LEG;  Surgeon: Hiram Gash, MD;  Location: [facility];  Service: Orthopedics;  Laterality: Right;  . HYSTEROSCOPY WITH D & C N/A 01/25/2016   Procedure: DILATATION AND CURETTAGE /HYSTEROSCOPY;  Surgeon: Mora Bellman, MD;  Location: [facility];  Service: Gynecology;  Laterality: N/A;  . LYMPH NODE BIOPSY Bilateral 07/22/2019   Procedure: LYMPH NODE BIOPSY;  Surgeon: Everitt Amber, MD;  Location: [facility];  Service: Gynecology;  Laterality: Bilateral;  . OPEN REDUCTION INTERNAL FIXATION (ORIF) TIBIA/FIBULA FRACTURE Right 04/10/2020   Procedure: OPEN REDUCTION INTERNAL FIXATION (ORIF) TIBIA/FIBULA FRACTURE;  Surgeon: Shona Needles, MD;  Location: [facility];  Service: Orthopedics;  Laterality: Right;  . ROBOT ASSISTED MYOMECTOMY N/A 07/22/2019   Procedure: XI ROBOTIC ASSISTED LAPAROSCOPIC RADICAL UPPER VAGINECTOMY, LEFT SALPINECTOMY, RIGHT SALPINGOOOPHERECTOMY;  Surgeon: Everitt Amber, MD;  Location: [facility];  Service: Gynecology;  Laterality: N/A;  . VAGINAL HYSTERECTOMY N/A 03/11/2017   Procedure: HYSTERECTOMY VAGINAL WITH MORCELLATION;  Surgeon: Chancy Milroy, MD;  Location: [facility];  Service: Gynecology;  Laterality: N/A;    There were no vitals filed for this visit.   Subjective Assessment - 11/13/20 1343    Subjective Patient reports continued pain in her right leg without any change in symptoms. She expresses still wanting to practice stair negotiation to allow for safe entry/exit at home due to two entrances having stairs and only one being level entry, limiting her options if she needs to get in or out quickly.    How long can you stand comfortably? 5 minutes    How long can  you walk comfortably? 10 minutes    Patient Stated Goals decrease pain, get out boot, walk without AD (PLOF)    Currently in Pain? Yes    Pain Score 8     Pain Location Leg    Pain Orientation Right;Lower    Pain Descriptors / Indicators Sore;Aching    Pain Type Chronic pain    Pain Onset More than a month ago    Pain Frequency Intermittent    Aggravating Factors  Walking, standing, constant    Pain Relieving Factors Nothing    Effect of Pain on Daily Activities Increased pain in weightbearing and difficulty navigating stairs               [facility] PT Assessment - 11/13/20 0001      Assessment   Medical Diagnosis s/p Rt tib-fib ORIF    Referring Provider (PT) Delray Alt, PA-C    Onset Date/Surgical Date 04/10/20      Precautions   Precautions None      Restrictions   Weight Bearing Restrictions No      Balance Screen   How many times? No falls since beginning skilled PT    Has the patient had a decrease in activity level because of a fear of falling?  Yes    Is the patient reluctant to leave their home because of a fear of falling?  Yes      Prior Function   Level of Independence Independent with household mobility with device;Independent with community mobility with device    Vocation On disability      Observation/Other Assessments   Observations Patient appears in no apparent distress    Focus on Therapeutic Outcomes (FOTO)  No FOTO - MCD      ROM / Strength   AROM / PROM / Strength AROM;Strength      AROM   AROM Assessment Site Ankle    Right/Left Ankle Right    Right Ankle Dorsiflexion 15    Right Ankle Plantar Flexion 60    Right Ankle Inversion 35    Right Ankle Eversion 20      Strength   Strength Assessment Site Ankle    Right/Left Ankle Right    Right Ankle Dorsiflexion 5/5    Right Ankle Plantar Flexion 4+/5    Right Ankle Inversion 4+/5    Right Ankle Eversion 4+/5      Transfers   Five time sit to stand comments  20.38 seconds from low mat using BUE on bilateral thighs                         [facility] Adult PT Treatment/Exercise - 11/13/20 0001      Ambulation/Gait   Ambulation/Gait Yes    Ambulation/Gait Assistance 5: Supervision    Assistive device Rollator    Gait Pattern Step-through pattern    Stairs Yes    Stairs Assistance 5: Supervision    Stair Management Technique Two rails    Number of Stairs 12    Height of Stairs 4    Gait Comments First set with step-to pattern ascending and descending steps. Second set reciprocal pattern ascending and  step-to descending. Then 3rd set ascending 4 inch step and descending 3 steps on 6 inch steps - pt unable to descend last 2 6-inch steps with decreased UE support. Pt ascended 6 inch steps again and descending to level ground via 4 inch steps.      Self-Care  Self-Care Other Self-Care Comments    Other Self-Care Comments  HEP, stair negotiation, POC      Ankle Exercises: Aerobic   Nustep L5 x 5 min LE only      Ankle Exercises: Seated   Heel Raises 20 reps    Toe Raise 20 reps                  PT Education - 11/13/20 2345    Education Details HEP, stair negotiation, POC    Person(s) Educated Patient    Methods Explanation;Demonstration;Verbal cues;Handout    Comprehension Verbalized understanding;Returned demonstration;Verbal cues required;Need further instruction            PT Short Term Goals - 10/09/20 1916      PT SHORT TERM GOAL #1   Title ankle DF to 5 deg    Baseline 10 deg 09/06/2020    Status Achieved      PT SHORT TERM GOAL #2   Title pt will demo full weight acceptance into Rt foot in static stance    Baseline able to demo full weight acceptance    Status Achieved             PT Long Term Goals - 11/13/20 1348      PT LONG TERM GOAL #1   Title Patient will be able to ambulate >/= 500 ft safely using LRAD    Baseline Patient using rollator full-time, currently only able to ambulate approximately 300 feet    Time 4   POC 6 weeks, incr time to accommodate MCD auth   Period Weeks    Status On-going      PT LONG TERM GOAL #2   Title Patient will report pain with ADLs <=2/10    Baseline Patient continues to report high level of pain with activity    Time 4    Period Weeks    Status On-going      PT LONG TERM GOAL #3   Title pt will ambulate in the community using LRAD    Baseline Patient using rollator full-time, continues to be limited with community ambulation    Time 4    Period Weeks    Status On-going      PT LONG TERM GOAL #4    Title pt will demo SLS comfortably for at least 5s without UE assist to demo full weight acceptance as well as balance control for safety    Baseline Patient unable to perform SLS on right    Time 4    Period Weeks    Status On-going      PT LONG TERM GOAL #5   Title Patient will be able to negotiate 4 standard stairs using single railing to improve ability to enter and leave home safely    Baseline Patient only able to negotiate small stairs using bilateral railings    Time 4    Period Weeks    Status On-going                 Plan - 11/13/20 1346    Clinical Impression Statement Patient presents to OPPT for re-evaluation for first time since re-evaluation 10/16/2020. She was able to ascend/descend set of small steps (4" steps) multiple times but had difficulty and hesitancy while attempting to navigate 6" steps (see flowsheet) but continues to require heavy use of BUE support on bilateral railings. She expresses interest in continuing stair training for increased safety and ability to allow her to enter/exit  through additional two entrances rather than the one level entry door she uses currently. Will extend POC 1x/week for 4 weeks to allow for strengthening and stair training to prepare patient for increased independence at home.    Examination-Activity Limitations Sit;Squat;Stairs;Stand;Transfers;Locomotion Level    PT Frequency 1x / week    PT Duration 4 weeks    PT Treatment/Interventions ADLs/Self Care Home Management;Cryotherapy;Electrical Stimulation;Gait training;Neuromuscular re-education;Therapeutic exercise;Therapeutic activities;Functional mobility training;Stair training;Patient/family education;Manual techniques;Taping;Passive range of motion;Scar mobilization;Vasopneumatic Device    PT Next Visit Plan Focus on stairs and general LE strengthening, balance training    PT Home Exercise Plan [id number]    Consulted and Agree with Plan of Care Patient           Patient  will benefit from skilled therapeutic intervention in order to improve the following deficits and impairments:  Abnormal gait,Decreased range of motion,Difficulty walking,Decreased activity tolerance,Pain,Improper body mechanics,Impaired flexibility,Decreased strength  Visit Diagnosis: Status post open reduction with internal fixation (ORIF) of fracture of ankle  Pain in right ankle and joints of right foot  Difficulty in walking, not elsewhere classified  Localized edema     Problem List Patient Active Problem List   Diagnosis Date Noted  . Acute blood loss anemia   . Leukopenia   . Vitamin D deficiency   . Acute lower UTI   . Postoperative pain   . Tibia/fibula fracture, right, sequela 04/15/2020  . Closed fracture of medial malleolus of right ankle 04/10/2020  . Open fracture of distal end of fibula and tibia, right, type I or II, initial encounter 04/09/2020  . Vaginal cancer ([place]) 07/22/2019  . Morbid obesity with BMI of 40.0-44.9, adult ([place]) 06/11/2019  . 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) 05/28/2019  . Visit for routine gyn exam 04/21/2019  . Vaginal atrophy 04/21/2019  . Painful lumpy right breast 07/27/2016  . Urinary tract infection 07/01/2015  . Diastolic dysfunction 25/02/3975  . Constipation 05/12/2015  . ESBL (extended spectrum beta-lactamase) producing bacteria infection 03/17/2015  . Diarrhea 03/03/2015  . Weakness 03/02/2015  . 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 Anxiety state 02/20/2015  . Claustrophobia 02/20/2015  . Transaminitis 02/18/2015  . Chest pain 02/18/2015  . Abdominal pain   . Dizziness   . Hypotension 01/04/2015  . Bipolar disorder ([place]) 06/29/2012     Haydee Monica, PT, DPT 11/13/20 11:55 PM  [facility] [address] [place], [place], [address] Phone: [phone]   Fax:  [phone]  Name: Jordan [name] MRN: [id number] Date of Birth: 03-08-1966

## 2020-11-14 ENCOUNTER — Telehealth: Payer: Self-pay

## 2020-11-14 NOTE — Telephone Encounter (Signed)
Returned patient's calls to confirm we have a session on Thursday.  [name] Counseling Intern

## 2020-11-16 NOTE — Progress Notes (Signed)
Jordan [name] Patient and [facility] Counseling Note  Session primarily focused on patient's relationships with others and her frustrations around the nursing home her boyfriend is living in. Patient talked about wanting to be listened to and actually heard, as well as how she is "tired of being alone." Patient expressed fears of her boyfriend or daughter dying, finding herself caught up in the "what ifs." Counselor provided validation of feelings, active listening, and empathy. Patient was oriented times three and while presented in a passive mood, became angry in session. Her affect was labile and there were no signs of SI/HI/NSSI. Patient's current situation of being alone most of the time has lead to increased feelings of loneliness. The anger she experiences appears to act as a shield to keep herself from being vulnerable. If patient can increase positive interactions with others, these feelings may decrease. Next session will explore what causes the "what-ifs."    [name] Counseling Intern

## 2020-11-21 ENCOUNTER — Telehealth: Payer: Self-pay

## 2020-11-21 NOTE — Telephone Encounter (Signed)
Returned patient's voicemails to confirm session on Thursday.   [name] Counseling Intern

## 2020-11-23 NOTE — Progress Notes (Signed)
Jordan [name] and [place] Counseling Note  [name] arrived late due to Medicaid ride. Session mainly focused on the nursing home pt's partner resides in and the change in his insurance. [name] described feeling ignored and like "they don't care." [name] said her "anger is so big" and she was confused. [name] was oriented times three and she was in an agitated mood with an exaggerated, labile affect. She showed no signs of SI/HI/NSSI. [name] does not understand the ways that people communicate with her, so she has thoughts like "They think I'm stupid," "They are mistreating me," or "I am stupid." These thoughts bring up anger, hurt, sadness and more. These feelings result in the [name] lashing out, further distancing herself from others. The anger she experiences appears to act as a shield to keep herself from being vulnerable. If [name] can increase positive interactions with others, these feelings may decrease. Next session will discuss what the [name] wants to do moving forward.     [name] Counseling Intern

## 2020-11-24 ENCOUNTER — Encounter: Payer: Self-pay | Admitting: General Practice

## 2020-11-24 NOTE — Progress Notes (Signed)
[facility] Spiritual Care Note  Received call from [place] to process recent stressors related to her SO Jeff's nursing-home care and coverage. Provided empathic listening and pastoral reflection.    [place], [place], [facility] Pager [phone] Voicemail [phone]

## 2020-11-27 ENCOUNTER — Ambulatory Visit: Payer: Medicaid Other | Attending: Student

## 2020-11-27 ENCOUNTER — Other Ambulatory Visit: Payer: Self-pay

## 2020-11-27 ENCOUNTER — Ambulatory Visit: Payer: Medicaid Other | Admitting: Physical Therapy

## 2020-11-27 DIAGNOSIS — M25571 Pain in right ankle and joints of right foot: Secondary | ICD-10-CM | POA: Insufficient documentation

## 2020-11-27 DIAGNOSIS — Z9889 Other specified postprocedural states: Secondary | ICD-10-CM | POA: Insufficient documentation

## 2020-11-27 DIAGNOSIS — Z8781 Personal history of (healed) traumatic fracture: Secondary | ICD-10-CM | POA: Insufficient documentation

## 2020-11-27 DIAGNOSIS — R262 Difficulty in walking, not elsewhere classified: Secondary | ICD-10-CM | POA: Insufficient documentation

## 2020-11-27 DIAGNOSIS — R600 Localized edema: Secondary | ICD-10-CM | POA: Insufficient documentation

## 2020-11-27 NOTE — Therapy (Signed)
[place], [place], [address] Phone: [phone]   Fax:  [phone]  Physical Therapy Treatment  Patient Details  Name: Jordan [name] MRN: [id number] Date of Birth: 12-May-1966 Referring Provider (PT): Delray Alt, PA-C   Encounter Date: 11/27/2020   PT End of Session - 11/27/20 1445    Visit Number 13    Number of Visits 16    Date for PT Re-Evaluation 12/16/20    Authorization Type MCD - submitted for re-auth 11/27/2020    Authorization Time Period --    Authorization - Visit Number --    Authorization - Number of Visits --    PT Start Time [id number]    PT Stop Time 1529    PT Time Calculation (min) 44 min    Activity Tolerance Patient tolerated treatment well;Other (comment)   limited due to fear of falling   Behavior During Therapy [facility] for tasks assessed/performed           Past Medical History:  Diagnosis Date  . Alcohol abuse   . Anemia    patient denies  . Bipolar 1 disorder (HCC)    No medications currently  . CAP (community acquired pneumonia) 03/17/2015  . 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 Mental disorder   . Obesity   . PICC line infection 05/17/2015  . Sepsis due to Gram negative bacteria (MDR E Coli) 02/18/2015  . 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 UTI (lower urinary tract infection)   . Vaginal Pap smear, abnormal     Past Surgical History:  Procedure Laterality Date  . ABDOMINAL HYSTERECTOMY    . CERVICAL CONIZATION W/BX N/A 07/01/2016   Procedure: CONIZATION CERVIX WITH BIOPSY;  Surgeon: Chancy Milroy, MD;  Location: [facility];  Service: Gynecology;  Laterality: N/A;  . CHOLECYSTECTOMY    . EXTERNAL FIXATION LEG Right 04/09/2020   Procedure: EXTERNAL FIXATION LEG;  Surgeon: Hiram Gash, MD;  Location: [place];  Service: Orthopedics;  Laterality: Right;  . HYSTEROSCOPY WITH D & C N/A 01/25/2016   Procedure: DILATATION AND CURETTAGE /HYSTEROSCOPY;   Surgeon: Mora Bellman, MD;  Location: [facility];  Service: Gynecology;  Laterality: N/A;  . LYMPH NODE BIOPSY Bilateral 07/22/2019   Procedure: LYMPH NODE BIOPSY;  Surgeon: Everitt Amber, MD;  Location: [facility];  Service: Gynecology;  Laterality: Bilateral;  . OPEN REDUCTION INTERNAL FIXATION (ORIF) TIBIA/FIBULA FRACTURE Right 04/10/2020   Procedure: OPEN REDUCTION INTERNAL FIXATION (ORIF) TIBIA/FIBULA FRACTURE;  Surgeon: Shona Needles, MD;  Location: [place];  Service: Orthopedics;  Laterality: Right;  . ROBOT ASSISTED MYOMECTOMY N/A 07/22/2019   Procedure: XI ROBOTIC ASSISTED LAPAROSCOPIC RADICAL UPPER VAGINECTOMY, LEFT SALPINECTOMY, RIGHT SALPINGOOOPHERECTOMY;  Surgeon: Everitt Amber, MD;  Location: [facility];  Service: Gynecology;  Laterality: N/A;  . VAGINAL HYSTERECTOMY N/A 03/11/2017   Procedure: HYSTERECTOMY VAGINAL WITH MORCELLATION;  Surgeon: Chancy Milroy, MD;  Location: [facility];  Service: Gynecology;  Laterality: N/A;    There were no vitals filed for this visit.   Subjective Assessment - 11/27/20 1743    Subjective "I talked to my doctor, and they said that my leg is healed up and I don't need to use the bone stimulation machine anymore so that is a good thing. It hurts more when it is cold but not as much when it is warm outside." Pt expressed concerns and asked questions about what to do if she were to fall and  how to get up properly (see flowsheet). She states she would like to continue working on stairs to feel safer entering/exiting home.    How long can you stand comfortably? 5 minutes    How long can you walk comfortably? 10 minutes    Patient Stated Goals decrease pain, get out boot, walk without AD (PLOF)    Currently in Pain? Yes    Pain Score 8     Pain Location Leg    Pain Orientation Right;Lower;Distal    Pain Descriptors / Indicators Sore;Aching    Pain Type Chronic pain    Pain Onset More than a month ago              [facility] PT Assessment - 11/27/20 0001       Assessment   Medical Diagnosis s/p Rt tib-fib ORIF    Referring Provider (PT) Delray Alt, PA-C    Onset Date/Surgical Date 04/10/20                         [facility] Adult PT Treatment/Exercise - 11/27/20 0001      Transfers   Comments Discussed and demonstrated sequencing to get up after fall (floor transfer) if pt does not having anything to hold onto nearby. Discussed that we can practice floor transfer in future session as it will be more time consuming since pt brought up concerns of how to get up mid session.      Ambulation/Gait   Stairs Yes    Stairs Assistance 5: Supervision;Other (comment)   supervision then standby assist while on 6" steps to decrease patient's anxiety as she had difficulty descending 6" step   Stair Management Technique Two rails   heavy reliance on handrails   Number of Stairs 18    Height of Stairs 4    Gait Comments Ascended/descended 4" steps x 3. Ascended 6" steps x 1 then descending 1 6" step before needing to turn around and descend 4" steps. Increased encouragement due to patient having difficulty primarily secondary to anxiety and fear of falling. Cues provided to allow for knee flexion with stance leg while descending as pt initially performs lateral lean towards railing.      Self-Care   Self-Care Other Self-Care Comments    Other Self-Care Comments  Discussed rationale for sit to stand without UE, tightened R grip on rollator for increased safety as pt complained of it being loose from transportation drivers placing it in/out of vehicle, explained and demonstrated eccentric quad control and optimal sequencing during stair negotiation. Discussed floor transfer in case of fall.      Ankle Exercises: Aerobic   Nustep L6 x 7 min UE and LE      Ankle Exercises: Seated   Other Seated Ankle Exercises Sit to stand from chair 2 x 10 with pt using BUE hands on thighs and rocking back to increase momentum. Cues to lean forward and attempt  without BUE use but pt able to try once and has difficulty following through with directions/heavy cues for multiple repetitions                  PT Education - 11/27/20 1748    Education Details Printed HEP for patient again, stair negotiation, reviewed and demonstrated floor transfer, reiterated rationale for exercises and eccentric quad control for safety on stairs and to allow for smoother and safer transfers.    Person(s) Educated Patient    Methods Explanation;Demonstration;Tactile cues;Verbal  cues;Handout    Comprehension Verbalized understanding;Returned demonstration;Verbal cues required;Tactile cues required;Need further instruction            PT Short Term Goals - 10/09/20 1916      PT SHORT TERM GOAL #1   Title ankle DF to 5 deg    Baseline 10 deg 09/06/2020    Status Achieved      PT SHORT TERM GOAL #2   Title pt will demo full weight acceptance into Rt foot in static stance    Baseline able to demo full weight acceptance    Status Achieved             PT Long Term Goals - 11/13/20 1348      PT LONG TERM GOAL #1   Title Patient will be able to ambulate >/= 500 ft safely using LRAD    Baseline Patient using rollator full-time, currently only able to ambulate approximately 300 feet    Time 4   POC 6 weeks, incr time to accommodate [employer] auth   Period Weeks    Status On-going      PT LONG TERM GOAL #2   Title Patient will report pain with ADLs <=2/10    Baseline Patient continues to report high level of pain with activity    Time 4    Period Weeks    Status On-going      PT LONG TERM GOAL #3   Title pt will ambulate in the community using LRAD    Baseline Patient using rollator full-time, continues to be limited with community ambulation    Time 4    Period Weeks    Status On-going      PT LONG TERM GOAL #4   Title pt will demo SLS comfortably for at least 5s without UE assist to demo full weight acceptance as well as balance control for safety     Baseline Patient unable to perform SLS on right    Time 4    Period Weeks    Status On-going      PT LONG TERM GOAL #5   Title Patient will be able to negotiate 4 standard stairs using single railing to improve ability to enter and leave home safely    Baseline Patient only able to negotiate small stairs using bilateral railings    Time 4    Period Weeks    Status On-going                 Plan - 11/27/20 1751    Clinical Impression Statement Patient had fair tolerance with treatment session with prolonged time taken especially while descending steps due to apprehension secondary to fear of falling. She does better ascending/descending 4" steps and explains that those are the height of her steps at home, but she demonstrates step-to pattern with difficulty attempting reciprocal step pattern ascending/descending. She has difficulty performing sit<>stand without UE assistance and without rocking back to increase momentum prior to standing as well as maintaining control during descent. Will plan to continue stair training for pt to be able to safely enter/exit through multiple entrances in home and spend a session practicing floor transfers as patient expressed concerns of not being able to get up if she falls.    Personal Factors and Comorbidities Fitness;Time since onset of injury/illness/exacerbation;Comorbidity 1;Education    Comorbidities mental disorder    Examination-Activity Limitations Sit;Squat;Stairs;Stand;Transfers;Locomotion Level    PT Frequency 1x / week    PT Duration 4 weeks  PT Treatment/Interventions ADLs/Self Care Home Management;Cryotherapy;Electrical Stimulation;Gait training;Neuromuscular re-education;Therapeutic exercise;Therapeutic activities;Functional mobility training;Stair training;Patient/family education;Manual techniques;Taping;Passive range of motion;Scar mobilization;Vasopneumatic Device    PT Next Visit Plan Focus on stairs and general LE  strengthening, balance training, floor transfers    PT [place] and Agree with Plan of Care Patient           Patient will benefit from skilled therapeutic intervention in order to improve the following deficits and impairments:  Abnormal gait,Decreased range of motion,Difficulty walking,Decreased activity tolerance,Pain,Improper body mechanics,Impaired flexibility,Decreased strength  Visit Diagnosis: Status post open reduction with internal fixation (ORIF) of fracture of ankle  Pain in right ankle and joints of right foot  Difficulty in walking, not elsewhere classified  Localized edema     Problem List Patient Active Problem List   Diagnosis Date Noted  . Acute blood loss anemia   . Leukopenia   . Vitamin D deficiency   . Acute lower UTI   . Postoperative pain   . Tibia/fibula fracture, right, sequela 04/15/2020  . Closed fracture of medial malleolus of right ankle 04/10/2020  . Open fracture of distal end of fibula and tibia, right, type I or II, initial encounter 04/09/2020  . Vaginal cancer ([place]) 07/22/2019  . Morbid obesity with BMI of 40.0-44.9, adult ([place]) 06/11/2019  . 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) 05/28/2019  . Visit for routine gyn exam 04/21/2019  . Vaginal atrophy 04/21/2019  . Painful lumpy right breast 07/27/2016  . Urinary tract infection 07/01/2015  . Diastolic dysfunction [id number]  . Constipation 05/12/2015  . ESBL (extended spectrum beta-lactamase) producing bacteria infection 03/17/2015  . Diarrhea 03/03/2015  . Weakness 03/02/2015  . 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 Anxiety state 02/20/2015  . Claustrophobia 02/20/2015  . Transaminitis 02/18/2015  . Chest pain 02/18/2015  . Abdominal pain   . Dizziness   . Hypotension 01/04/2015  . Bipolar disorder ([place]) 06/29/2012      Haydee Monica, PT, DPT 11/27/20 5:58 PM  [facility] [address] [place], [place], [address] Phone: [phone]   Fax:  [phone]  Name: Jordan [name] MRN: [id number] Date of Birth: [birth year]

## 2020-11-30 NOTE — Progress Notes (Signed)
[place] Patient and [facility] Counseling Note  Patient largely discussed her frustrations with her boyfriend, his nursing home, and how people in the medical profession treat her. Patient and counselor worked to identify emotions for patient. Counselor provided validation of feelings, reflective listening, and a safe space to process experiences. She was oriented times three and in an annoyed mood. Her affect was appropriate to context and there were no signs of SI/HI/NSSI. Patient does not understand the ways that people communicate with her, so she has thoughts like "They think I'm stupid," "They are mistreating me," or "I am stupid." These thoughts bring up anger, hurt, sadness and more. These feelings result in the patient lashing out, further distancing herself from others. The anger she experiences appears to act as a shield to keep herself from being vulnerable. If patient can increase positive interactions with others, these feelings may decrease. Next session will discuss patient's stressors and growth.   [name] Counseling Intern

## 2020-12-04 ENCOUNTER — Other Ambulatory Visit: Payer: Self-pay

## 2020-12-04 ENCOUNTER — Ambulatory Visit: Payer: Medicaid Other

## 2020-12-04 DIAGNOSIS — M25571 Pain in right ankle and joints of right foot: Secondary | ICD-10-CM

## 2020-12-04 DIAGNOSIS — Z9889 Other specified postprocedural states: Secondary | ICD-10-CM

## 2020-12-04 DIAGNOSIS — R262 Difficulty in walking, not elsewhere classified: Secondary | ICD-10-CM

## 2020-12-04 DIAGNOSIS — Z8781 Personal history of (healed) traumatic fracture: Secondary | ICD-10-CM

## 2020-12-04 DIAGNOSIS — R600 Localized edema: Secondary | ICD-10-CM | POA: Diagnosis present

## 2020-12-04 NOTE — Therapy (Signed)
[place], [place], [address] Phone: [phone]   Fax:  [phone]  Physical Therapy Treatment  Patient Details  Name: Jordan [name] MRN: [id number] Date of Birth: 1966-04-11 Referring Provider (PT): Delray Alt, PA-C   Encounter Date: 12/04/2020   PT End of Session - 12/04/20 1400    Visit Number 14    Number of Visits 16    Date for PT Re-Evaluation 12/16/20    Authorization Type MCD    Authorization Time Period 2/14//2022 - 12/31/2020    Authorization - Visit Number 1    Authorization - Number of Visits 4    PT Start Time 1400    PT Stop Time 7672    PT Time Calculation (min) 38 min    Activity Tolerance Patient tolerated treatment well;Other (comment)   limited due to fear of falling   Behavior During Therapy [facility] for tasks assessed/performed           Past Medical History:  Diagnosis Date  . Alcohol abuse   . Anemia    patient denies  . Bipolar 1 disorder (HCC)    No medications currently  . CAP (community acquired pneumonia) 03/17/2015  . 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 Mental disorder   . Obesity   . PICC line infection 05/17/2015  . Sepsis due to Gram negative bacteria (MDR E Coli) 02/18/2015  . 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 UTI (lower urinary tract infection)   . Vaginal Pap smear, abnormal     Past Surgical History:  Procedure Laterality Date  . ABDOMINAL HYSTERECTOMY    . CERVICAL CONIZATION W/BX N/A 07/01/2016   Procedure: CONIZATION CERVIX WITH BIOPSY;  Surgeon: Chancy Milroy, MD;  Location: [facility];  Service: Gynecology;  Laterality: N/A;  . CHOLECYSTECTOMY    . EXTERNAL FIXATION LEG Right 04/09/2020   Procedure: EXTERNAL FIXATION LEG;  Surgeon: Hiram Gash, MD;  Location: [place];  Service: Orthopedics;  Laterality: Right;  . HYSTEROSCOPY WITH D & C N/A 01/25/2016   Procedure: DILATATION AND CURETTAGE /HYSTEROSCOPY;  Surgeon: Mora Bellman, MD;  Location: [facility];  Service: Gynecology;  Laterality: N/A;  . LYMPH NODE BIOPSY Bilateral 07/22/2019   Procedure: LYMPH NODE BIOPSY;  Surgeon: Everitt Amber, MD;  [facility];  Service: Gynecology;  Laterality: Bilateral;  . OPEN REDUCTION INTERNAL FIXATION (ORIF) TIBIA/FIBULA FRACTURE Right 04/10/2020   Procedure: OPEN REDUCTION INTERNAL FIXATION (ORIF) TIBIA/FIBULA FRACTURE;  Surgeon: Shona Needles, MD;  Location: [place];  Service: Orthopedics;  Laterality: Right;  . ROBOT ASSISTED MYOMECTOMY N/A 07/22/2019   Procedure: XI ROBOTIC ASSISTED LAPAROSCOPIC RADICAL UPPER VAGINECTOMY, LEFT SALPINECTOMY, RIGHT SALPINGOOOPHERECTOMY;  Surgeon: Everitt Amber, MD;  Location: [facility];  Service: Gynecology;  Laterality: N/A;  . VAGINAL HYSTERECTOMY N/A 03/11/2017   Procedure: HYSTERECTOMY VAGINAL WITH MORCELLATION;  Surgeon: Chancy Milroy, MD;  Location: [place] [facility];  Service: Gynecology;  Laterality: N/A;    There were no vitals filed for this visit.   Subjective Assessment - 12/04/20 1404    Subjective "The pain started out good but now after having to go through the ride situation and having to get a different ride, I am in more pain now." Pt expresses her frustration with transportation via [facility] compared to [employer] transportation primarily due to type of vehicle and difficulty getting in/out and navigating her rollator.    How long can you stand comfortably? 5 minutes  How long can you walk comfortably? 10 minutes    Patient Stated Goals decrease pain, get out boot, walk without AD (PLOF)    Currently in Pain? Yes    Pain Score 5     Pain Location Leg    Pain Orientation Right;Lower;Distal    Pain Descriptors / Indicators Aching;Sore    Pain Type Chronic pain    Pain Onset More than a month ago              [facility] PT Assessment - 12/04/20 0001      Assessment   Medical Diagnosis s/p Rt tib-fib ORIF    Referring Provider (PT) Delray Alt, PA-C    Onset Date/Surgical Date  04/10/20                         [facility] Adult PT Treatment/Exercise - 12/04/20 0001      Ambulation/Gait   Stairs Yes    Stairs Assistance 5: Supervision    Gait Comments Small steps (4") x 4 with B handrails. Large (6") steps x 2 at end of session with B HR use. Continued heavy reliance on BUE for support with cues to use LE strength instead - cues for quad activation ascending and to allow for controlled knee FL while descending steps      Self-Care   Self-Care Other Self-Care Comments    Other Self-Care Comments  See patient education                  PT Education - 12/04/20 1449    Education Details Breathing and relaxation techniques when patient becomes hesitant while navigating stairs. Discussed that patient may need to follow up with doctor regarding her anxiety as she explains having a lot going on and her behavior reflects that she continues to have limitations secondary to her anxiety and fear of falling.    Person(s) Educated Patient    Methods Explanation;Demonstration;Tactile cues;Verbal cues    Comprehension Verbalized understanding;Returned demonstration;Verbal cues required;Tactile cues required;Need further instruction            PT Short Term Goals - 10/09/20 1916      PT SHORT TERM GOAL #1   Title ankle DF to 5 deg    Baseline 10 deg 09/06/2020    Status Achieved      PT SHORT TERM GOAL #2   Title pt will demo full weight acceptance into Rt foot in static stance    Baseline able to demo full weight acceptance    Status Achieved             PT Long Term Goals - 12/04/20 1451      PT LONG TERM GOAL #1   Title Patient will be able to ambulate >/= 500 ft safely using LRAD    Baseline Patient using rollator full-time, currently only able to ambulate approximately 300 feet - continues to fatigue with shorter distances requiring seated break    Time 4   POC 6 weeks, incr time to accommodate [employer] auth   Period Weeks    Status On-going       PT LONG TERM GOAL #2   Title Patient will report pain with ADLs <=2/10    Baseline Patient continues to report high level of pain with activity with 5/10 at rest this session    Time 4    Period Weeks    Status On-going      PT LONG  TERM GOAL #3   Title pt will ambulate in the community using LRAD    Baseline Patient using rollator full-time, continues to be limited with community ambulation    Time 4    Period Weeks    Status On-going      PT LONG TERM GOAL #4   Title pt will demo SLS comfortably for at least 5s without UE assist to demo full weight acceptance as well as balance control for safety    Baseline Unable to perform SLS - heavy reliance on BUE for support for balance    Time 4    Period Weeks    Status On-going      PT LONG TERM GOAL #5   Title Patient will be able to negotiate 4 standard stairs using single railing to improve ability to enter and leave home safely    Baseline Patient able to negotiate small and large stairs using bilateral railings - heavy reliance on railings for support    Time 4    Period Weeks    Status On-going                 Plan - 12/04/20 1400    Clinical Impression Statement Patient had fair tolerance with PT today, requiring frequent seated rest breaks throughout session. Focus continued on stair negotiation today as pt refused to practice floor transfers this session as she wanted to be able to navigate 6" steps before attempting floor transfers. Towards end of session, patient was able to ascend/descend 6" steps with step-to pattern and slow pace secondary to fear of falling 2x with rest break between attempts. PT showed patient the mats that would be used for floor transfers and described set up to ease patient's hesitation. PT discussed with patient that her anxiety and fear of falling seems to be limiting her more than her strength and ability to perform tasks. Will plan to re-evaluate and initiate floor transfers next  session.    Personal Factors and Comorbidities Fitness;Time since onset of injury/illness/exacerbation;Comorbidity 1;Education    Comorbidities mental disorder    Examination-Activity Limitations Sit;Squat;Stairs;Stand;Transfers;Locomotion Level    PT Frequency 1x / week    PT Duration 4 weeks    PT Treatment/Interventions ADLs/Self Care Home Management;Cryotherapy;Electrical Stimulation;Gait training;Neuromuscular re-education;Therapeutic exercise;Therapeutic activities;Functional mobility training;Stair training;Patient/family education;Manual techniques;Taping;Passive range of motion;Scar mobilization;Vasopneumatic Device    PT Next Visit Plan Re-evaluate. Floor transfers    PT [place] and Agree with Plan of Care Patient           Patient will benefit from skilled therapeutic intervention in order to improve the following deficits and impairments:  Abnormal gait,Decreased range of motion,Difficulty walking,Decreased activity tolerance,Pain,Improper body mechanics,Impaired flexibility,Decreased strength  Visit Diagnosis: Status post open reduction with internal fixation (ORIF) of fracture of ankle  Pain in right ankle and joints of right foot  Difficulty in walking, not elsewhere classified     Problem List Patient Active Problem List   Diagnosis Date Noted  . Acute blood loss anemia   . Leukopenia   . Vitamin D deficiency   . Acute lower UTI   . Postoperative pain   . Tibia/fibula fracture, right, sequela 04/15/2020  . Closed fracture of medial malleolus of right ankle 04/10/2020  . Open fracture of distal end of fibula and tibia, right, type I or II, initial encounter 04/09/2020  . Vaginal cancer ([place]) 07/22/2019  . Morbid obesity with BMI of 40.0-44.9, adult ([name]) 06/11/2019  .  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 Visit for routine gyn exam 04/21/2019  . Vaginal atrophy 04/21/2019  . Painful lumpy right breast  07/27/2016  . Urinary tract infection 07/01/2015  . Diastolic dysfunction 88/87/5797  . Constipation 05/12/2015  . ESBL (extended spectrum beta-lactamase) producing bacteria infection 03/17/2015  . Diarrhea 03/03/2015  . Weakness 03/02/2015  . 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 Anxiety state 02/20/2015  . Claustrophobia 02/20/2015  . Transaminitis 02/18/2015  . Chest pain 02/18/2015  . Abdominal pain   . Dizziness   . Hypotension 01/04/2015  . Bipolar disorder ([place]) 06/29/2012       Haydee Monica, PT, DPT 12/04/20 3:01 PM  [facility] [address] [place], [place], [address] Phone: [phone]   Fax:  [phone]  Name: Particia Strahm MRN: [id number] Date of Birth: [birth year]

## 2020-12-07 ENCOUNTER — Other Ambulatory Visit: Payer: Self-pay

## 2020-12-07 NOTE — Progress Notes (Signed)
[place] Patient and [facility] Counseling Note  Patient described stressors around her SO's nursing home, their mistreatment, and visiting other doctors. The patient has improved by reducing her "blow-ups" but does not know how to respond otherwise. Patient and counselor also discussed the worry for patient's daughter, dear friend and her longing for her SO. Counselor provided active listening, validation of feelings, and reflections of content. The patient was oriented times three and in an an irritated mood. Her affect was exaggerated but less than usual. No indications of SI/HI/NSSI were present. Patient does not understand the ways that people communicate with her, so she has thoughts like "They think I'm stupid," "They are mistreating me," or "I am stupid." These thoughts bring up anger, hurt, sadness and more. These feelings result in the patient lashing out, further distancing herself from others.The anger she experiences appears to act as a shield to keep herself from being vulnerable. If patient can increase positive interactions with others, these feelings may decrease.Next session will focus on coping skills, stressors, and growth.   [name] Counseling Intern

## 2020-12-11 ENCOUNTER — Telehealth: Payer: Self-pay | Admitting: *Deleted

## 2020-12-11 NOTE — Telephone Encounter (Signed)
Spoke with Jordan [name] and she states that she does not have any vaginal discharge,just the odor which is gets when she has a UTI.   She stated that her urologist's office at [facility] told her that she could come in for a Urinalysis/Culture. She has to look into [employer] or Medicaid transportation.  She may get a urine when she goes to the urologist with her boyfriend who has the same physician on 12-18-20.  Told her to let  us know if she has a UTI or not. If no UTI  And the odor continues then she could possibly see Dr. Lahoma Crocker for further evaluation.  [name] verbalized understanding.

## 2020-12-11 NOTE — Telephone Encounter (Signed)
Patient called and stated "I have a strong odor down there. I called and spoke with the nurse from the UTI doctor's office. That office told me to call my gyn office. I have being drinking a lot water to try and help it, but it is not working. I don't have any burning, bleeding or urine frequency. I don still have some pain." Patient stated that she does have an appt this week with her counselor downstairs. Explained the message would be given to [facility] APP and someone will call her back. Patient stated that she will be willing to give urine sample.

## 2020-12-12 ENCOUNTER — Telehealth: Payer: Self-pay

## 2020-12-12 NOTE — Telephone Encounter (Signed)
Called to remind of session tomorrow.    Vivyan Biggers Counseling Intern 

## 2020-12-13 NOTE — Progress Notes (Signed)
Jordan [name] and [facility] Counseling Note  The [name] was over an hour late to session due to Medicaid ride, so session became brief check-in. [name] shared updates and process briefly. Counselor provided active listening and validation of feelings. [name] was oriented times three and in an agitated mood. Her affect was exaggerated and she showed no signs of SI/HI/NSSI. [name] does not understand the ways that people communicate with her, so she has thoughts like "They think I'm stupid," "They are mistreating me," or "I am stupid." These thoughts bring up anger, hurt, sadness and more. These feelings result in the [name] lashing out, further distancing herself from others.The anger she experiences appears to act as a shield to keep herself from being vulnerable. If [name] can increase positive interactions with others, these feelings may decrease. Next session will explore coping skills.     [name] Counseling Intern

## 2020-12-14 ENCOUNTER — Encounter: Payer: Self-pay | Admitting: General Practice

## 2020-12-14 ENCOUNTER — Other Ambulatory Visit: Payer: Self-pay

## 2020-12-14 ENCOUNTER — Telehealth: Payer: Self-pay

## 2020-12-14 ENCOUNTER — Ambulatory Visit: Payer: Medicaid Other

## 2020-12-14 DIAGNOSIS — Z9889 Other specified postprocedural states: Secondary | ICD-10-CM | POA: Diagnosis not present

## 2020-12-14 DIAGNOSIS — R262 Difficulty in walking, not elsewhere classified: Secondary | ICD-10-CM

## 2020-12-14 DIAGNOSIS — Z8781 Personal history of (healed) traumatic fracture: Secondary | ICD-10-CM

## 2020-12-14 DIAGNOSIS — M25571 Pain in right ankle and joints of right foot: Secondary | ICD-10-CM

## 2020-12-14 DIAGNOSIS — R600 Localized edema: Secondary | ICD-10-CM

## 2020-12-14 NOTE — Progress Notes (Signed)
[place] Spiritual Care Note  Caught up with Arayla by phone. She used the opportunity to share and process her PT progress, as well as some personal concerns. We plan to follow up by phone next month.   [place], [place], [facility] Pager [phone] Voicemail [phone]

## 2020-12-14 NOTE — Telephone Encounter (Signed)
Returned patient's VM.  [name] Counseling Intern

## 2020-12-14 NOTE — Therapy (Signed)
[place], [place], [address] Phone: [phone]   Fax:  [phone]  Physical Therapy Treatment/Re-evaluation  Patient Details  Name: Jordan [name] MRN: [id number] Date of Birth: 06-13-1966 Referring Provider (PT): Delray Alt, PA-C   Encounter Date: 12/14/2020   PT End of Session - 12/14/20 1218    Visit Number 15    Number of Visits 19    Date for PT Re-Evaluation 01/13/21    Authorization Type MCD    Authorization Time Period 2/14//2022 - 12/31/2020    Authorization - Visit Number 2    Authorization - Number of Visits 4    PT Start Time 8502    PT Stop Time 1300    PT Time Calculation (min) 45 min    Equipment Utilized During Treatment Gait belt   gait belt during attempted floor transfer   Activity Tolerance Patient tolerated treatment well;Other (comment)   limited due to fear of falling   Behavior During Therapy [facility] for tasks assessed/performed           Past Medical History:  Diagnosis Date  . Alcohol abuse   . Anemia    patient denies  . Bipolar 1 disorder (HCC)    No medications currently  . CAP (community acquired pneumonia) 03/17/2015  . 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 Mental disorder   . Obesity   . PICC line infection 05/17/2015  . Sepsis due to Gram negative bacteria (MDR E Coli) 02/18/2015  . 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 UTI (lower urinary tract infection)   . Vaginal Pap smear, abnormal     Past Surgical History:  Procedure Laterality Date  . ABDOMINAL HYSTERECTOMY    . CERVICAL CONIZATION W/BX N/A 07/01/2016   Procedure: CONIZATION CERVIX WITH BIOPSY;  Surgeon: Chancy Milroy, MD;  Location: [facility];  Service: Gynecology;  Laterality: N/A;  . CHOLECYSTECTOMY    . EXTERNAL FIXATION LEG Right 04/09/2020   Procedure: EXTERNAL FIXATION LEG;  Surgeon: Hiram Gash, MD;  Location: [place];  Service: Orthopedics;  Laterality: Right;  .  HYSTEROSCOPY WITH D & C N/A 01/25/2016   Procedure: DILATATION AND CURETTAGE /HYSTEROSCOPY;  Surgeon: Mora Bellman, MD;  Location: [facility];  Service: Gynecology;  Laterality: N/A;  . LYMPH NODE BIOPSY Bilateral 07/22/2019   Procedure: LYMPH NODE BIOPSY;  Surgeon: Everitt Amber, MD;  Location: [facility];  Service: Gynecology;  Laterality: Bilateral;  . OPEN REDUCTION INTERNAL FIXATION (ORIF) TIBIA/FIBULA FRACTURE Right 04/10/2020   Procedure: OPEN REDUCTION INTERNAL FIXATION (ORIF) TIBIA/FIBULA FRACTURE;  Surgeon: Shona Needles, MD;  Location: [facility];  Service: Orthopedics;  Laterality: Right;  . ROBOT ASSISTED MYOMECTOMY N/A 07/22/2019   Procedure: XI ROBOTIC ASSISTED LAPAROSCOPIC RADICAL UPPER VAGINECTOMY, LEFT SALPINECTOMY, RIGHT SALPINGOOOPHERECTOMY;  Surgeon: Everitt Amber, MD;  Location: [facility];  Service: Gynecology;  Laterality: N/A;  . VAGINAL HYSTERECTOMY N/A 03/11/2017   Procedure: HYSTERECTOMY VAGINAL WITH MORCELLATION;  Surgeon: Chancy Milroy, MD;  Location: [facility];  Service: Gynecology;  Laterality: N/A;    There were no vitals filed for this visit.   Subjective Assessment - 12/14/20 1219    Subjective Patient denies pain upon arrival. She continues to feel frustration with her transportation situation and states that several uber drivers have been uncomfortable placing her rollator in the vehicle.    How long can you stand comfortably? 5 minutes    How long can you walk comfortably? 10  minutes    Patient Stated Goals decrease pain, get out boot, walk without AD (PLOF)    Currently in Pain? No/denies    Pain Score 0-No pain    Pain Onset More than a month ago              [facility] PT Assessment - 12/14/20 0001      Assessment   Medical Diagnosis s/p Rt tib-fib ORIF    Referring Provider (PT) Delray Alt, PA-C    Onset Date/Surgical Date 04/10/20      Observation/Other Assessments   Focus on Therapeutic Outcomes (FOTO)  No FOTO - MCD      AROM   Right/Left Ankle Right     Right Ankle Dorsiflexion 15    Right Ankle Plantar Flexion 60    Right Ankle Inversion 35    Right Ankle Eversion 20      Strength   Right Ankle Dorsiflexion 5/5    Right Ankle Plantar Flexion 5/5   modified test in sitting   Right Ankle Inversion 5/5    Right Ankle Eversion 4+/5                         [facility] Adult PT Treatment/Exercise - 12/14/20 0001      Ankle Exercises: Aerobic   Nustep L6 x 6 min LE only      Ankle Exercises: Standing   Other Standing Ankle Exercises Spent session attempting floor transfer from mat on ground with chair and low mat nearby - low mat used for BUE assist/support. Demonstration and heavy cues and encouragement throughout session (TC and VC)                  PT Education - 12/14/20 1307    Education Details Breathing and relaxation techniques when patient becomes hesitant while navigating floor transfer and functional tasks. Reiterated that patient may need to follow up with doctor regarding her anxiety as she explains having a lot going on and her behavior reflects that she continues to have limitations secondary to her anxiety and fear of falling. Provided new printout of HEP per patient request.    Person(s) Educated Patient    Methods Explanation;Demonstration;Tactile cues;Verbal cues;Handout    Comprehension Verbalized understanding;Returned demonstration;Verbal cues required;Tactile cues required;Need further instruction            PT Short Term Goals - 10/09/20 1916      PT SHORT TERM GOAL #1   Title ankle DF to 5 deg    Baseline 10 deg 09/06/2020    Status Achieved      PT SHORT TERM GOAL #2   Title pt will demo full weight acceptance into Rt foot in static stance    Baseline able to demo full weight acceptance    Status Achieved             PT Long Term Goals - 12/14/20 1322      PT LONG TERM GOAL #1   Title Patient will be able to ambulate >/= 500 ft safely using LRAD    Baseline Patient using  rollator full-time, currently only able to ambulate approximately 300 feet - continues to fatigue with shorter distances requiring seated break    Time 4   POC 6 weeks, incr time to accommodate MCD auth   Period Weeks    Status On-going      PT LONG TERM GOAL #2   Title Patient will report pain  with ADLs <=2/10    Baseline Patient continues to report high level of pain with activity with 5/10 at rest this session    Time 4    Period Weeks    Status On-going      PT LONG TERM GOAL #3   Title pt will ambulate in the community using LRAD    Baseline Patient using rollator full-time, continues to be limited with community ambulation    Time 4    Period Weeks    Status On-going      PT LONG TERM GOAL #4   Title pt will demo SLS comfortably for at least 5s without UE assist to demo full weight acceptance as well as balance control for safety    Baseline Unable to perform SLS - heavy reliance on BUE for support for balance    Time 4    Period Weeks    Status On-going      PT LONG TERM GOAL #5   Title Patient will be able to negotiate 4 standard stairs using single railing to improve ability to enter and leave home safely    Baseline Patient able to negotiate small and large stairs using bilateral railings - heavy reliance on railings for support    Time 4    Period Weeks    Status On-going                 Plan - 12/14/20 1309    Clinical Impression Statement Focus on attempted floor transfer from mat on floor using lower plinth for BUE support and assist. Patient demonstrates strength and mobility WFL but continues to be significantly limited by her fear and anxiety for which PT advised her to speak with her physician about. Emphasized relaxation techniques and visualization of sequencing for floor transfer before attempts. Patient was unable to perform floor transfer today but insists on spending a few more sessions practicing as she was able to with stair negotiation. She has  increased fear due to previous falls and explains that she is fearful that she will fall while along inside or outside of her home and not be able to get up or call for help. Will plan to continue practicing floor transfers and D/C if patient's participation continues to be significantly limited by fear as she will no longer progress with skilled PT until her anxiety and fear is addressed.    Personal Factors and Comorbidities Fitness;Time since onset of injury/illness/exacerbation;Comorbidity 1;Education    Comorbidities mental disorder    Examination-Activity Limitations Sit;Squat;Stairs;Stand;Transfers;Locomotion Level    PT Frequency 1x / week    PT Duration 4 weeks    PT Treatment/Interventions ADLs/Self Care Home Management;Cryotherapy;Electrical Stimulation;Gait training;Neuromuscular re-education;Therapeutic exercise;Therapeutic activities;Functional mobility training;Stair training;Patient/family education;Manual techniques;Taping;Passive range of motion;Scar mobilization;Vasopneumatic Device    PT Next Visit Plan Floor transfers    PT [place] and Agree with Plan of Care Patient           Patient will benefit from skilled therapeutic intervention in order to improve the following deficits and impairments:  Abnormal gait,Decreased range of motion,Difficulty walking,Decreased activity tolerance,Pain,Improper body mechanics,Impaired flexibility,Decreased strength  Visit Diagnosis: Status post open reduction with internal fixation (ORIF) of fracture of ankle  Pain in right ankle and joints of right foot  Difficulty in walking, not elsewhere classified  Localized edema     Problem List Patient Active Problem List   Diagnosis Date Noted  . Acute blood loss anemia   .  Leukopenia   . Vitamin D deficiency   . Acute lower UTI   . Postoperative pain   . Tibia/fibula fracture, right, sequela 04/15/2020  . Closed fracture of medial malleolus of right  ankle 04/10/2020  . Open fracture of distal end of fibula and tibia, right, type I or II, initial encounter 04/09/2020  . Vaginal cancer ([place]) 07/22/2019  . Morbid obesity with BMI of 40.0-44.9, adult ([place]) 06/11/2019  . 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 Visit for routine gyn exam 04/21/2019  . Vaginal atrophy 04/21/2019  . Painful lumpy right breast 07/27/2016  . Urinary tract infection 07/01/2015  . Diastolic dysfunction 29/79/8921  . Constipation 05/12/2015  . ESBL (extended spectrum beta-lactamase) producing bacteria infection 03/17/2015  . Diarrhea 03/03/2015  . Weakness 03/02/2015  . 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 Anxiety state 02/20/2015  . Claustrophobia 02/20/2015  . Transaminitis 02/18/2015  . Chest pain 02/18/2015  . Abdominal pain   . Dizziness   . Hypotension 01/04/2015  . Bipolar disorder ([place]) 06/29/2012      Haydee Monica, PT, DPT 12/14/20 1:23 PM  [place] [facility] [address] [place], [place], [address] Phone: [phone]   Fax:  [phone]  Name: Elma Shands MRN: [id number] Date of Birth: 11-Apr-1966

## 2020-12-19 ENCOUNTER — Other Ambulatory Visit: Payer: Self-pay

## 2020-12-19 ENCOUNTER — Encounter: Payer: Self-pay | Admitting: Physical Therapy

## 2020-12-19 ENCOUNTER — Encounter: Payer: Self-pay | Admitting: Obstetrics & Gynecology

## 2020-12-19 ENCOUNTER — Telehealth: Payer: Self-pay | Admitting: *Deleted

## 2020-12-19 ENCOUNTER — Telehealth: Payer: Self-pay

## 2020-12-19 ENCOUNTER — Ambulatory Visit: Payer: Medicaid Other | Attending: Student

## 2020-12-19 DIAGNOSIS — Z9889 Other specified postprocedural states: Secondary | ICD-10-CM | POA: Diagnosis present

## 2020-12-19 DIAGNOSIS — Z8781 Personal history of (healed) traumatic fracture: Secondary | ICD-10-CM | POA: Insufficient documentation

## 2020-12-19 DIAGNOSIS — M25571 Pain in right ankle and joints of right foot: Secondary | ICD-10-CM | POA: Diagnosis present

## 2020-12-19 DIAGNOSIS — R262 Difficulty in walking, not elsewhere classified: Secondary | ICD-10-CM | POA: Diagnosis present

## 2020-12-19 DIAGNOSIS — R600 Localized edema: Secondary | ICD-10-CM

## 2020-12-19 NOTE — Therapy (Addendum)
[place], [place], [address] Phone: [phone]   Fax:  [phone]  Physical Therapy Treatment  Patient Details  Name: Jordan [name] MRN: [id number] Date of Birth: Sep 17, 1966 Referring Provider (PT): Delray Alt, PA-C   Encounter Date: 12/19/2020   PT End of Session - 12/19/20 1347    Visit Number 16    Number of Visits 19    Date for PT Re-Evaluation 01/13/21    Authorization Type MCD    Authorization Time Period 2/14//2022 - 12/31/2020    Authorization - Visit Number 3    Authorization - Number of Visits 4    PT Start Time 1300    PT Stop Time 1345    PT Time Calculation (min) 45 min    Activity Tolerance Patient tolerated treatment well    Behavior During Therapy [facility] for tasks assessed/performed           Past Medical History:  Diagnosis Date  . Alcohol abuse   . Anemia    patient denies  . Bipolar 1 disorder (HCC)    No medications currently  . CAP (community acquired pneumonia) 03/17/2015  . 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 Mental disorder   . Obesity   . PICC line infection 05/17/2015  . Sepsis due to Gram negative bacteria (MDR E Coli) 02/18/2015  . 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 UTI (lower urinary tract infection)   . Vaginal Pap smear, abnormal     Past Surgical History:  Procedure Laterality Date  . ABDOMINAL HYSTERECTOMY    . CERVICAL CONIZATION W/BX N/A 07/01/2016   Procedure: CONIZATION CERVIX WITH BIOPSY;  Surgeon: Chancy Milroy, MD;  Location: [facility];  Service: Gynecology;  Laterality: N/A;  . CHOLECYSTECTOMY    . EXTERNAL FIXATION LEG Right 04/09/2020   Procedure: EXTERNAL FIXATION LEG;  Surgeon: Hiram Gash, MD;  Location: [place];  Service: Orthopedics;  Laterality: Right;  . HYSTEROSCOPY WITH D & C N/A 01/25/2016   Procedure: DILATATION AND CURETTAGE /HYSTEROSCOPY;  Surgeon: Mora Bellman, MD;  Location: [facility];  Service:  Gynecology;  Laterality: N/A;  . LYMPH NODE BIOPSY Bilateral 07/22/2019   Procedure: LYMPH NODE BIOPSY;  Surgeon: Everitt Amber, MD;  Location: [facility];  Service: Gynecology;  Laterality: Bilateral;  . OPEN REDUCTION INTERNAL FIXATION (ORIF) TIBIA/FIBULA FRACTURE Right 04/10/2020   Procedure: OPEN REDUCTION INTERNAL FIXATION (ORIF) TIBIA/FIBULA FRACTURE;  Surgeon: Shona Needles, MD;  Location: [place];  Service: Orthopedics;  Laterality: Right;  . ROBOT ASSISTED MYOMECTOMY N/A 07/22/2019   Procedure: XI ROBOTIC ASSISTED LAPAROSCOPIC RADICAL UPPER VAGINECTOMY, LEFT SALPINECTOMY, RIGHT SALPINGOOOPHERECTOMY;  Surgeon: Everitt Amber, MD;  Location[facility];  Service: Gynecology;  Laterality: N/A;  . VAGINAL HYSTERECTOMY N/A 03/11/2017   Procedure: HYSTERECTOMY VAGINAL WITH MORCELLATION;  Surgeon: Chancy Milroy, MD;  Location: [facility];  Service: Gynecology;  Laterality: N/A;    There were no vitals filed for this visit.   Subjective Assessment - 12/19/20 1358    Subjective Patient reports 1/10 pain in R anterior distal leg upon arrival. She had an appointment with family doctor but reports that she was unable to discuss her anxiety and fear of falling during it. She plans to schedule another appointment soon.    How long can you stand comfortably? 5 minutes    How long can you walk comfortably? 10 minutes    Patient Stated Goals decrease pain, get out boot,  walk without AD (PLOF)    Currently in Pain? Yes    Pain Score 1     Pain Location Leg    Pain Orientation Right;Lower;Distal    Pain Descriptors / Indicators Aching;Sore    Pain Type Chronic pain    Pain Onset More than a month ago              [facility] PT Assessment - 12/19/20 0001      Assessment   Medical Diagnosis s/p Rt tib-fib ORIF    Referring Provider (PT) Delray Alt, PA-C    Onset Date/Surgical Date 04/10/20           Objective measurements 12/19/2020 Right ankle AROM:  Dorsiflexion 15 degrees Plantarflexion 60  degrees Inversion 35 degrees Eversion 20 degrees  Ankle MMT 5/5 grossly except eversion 4+/5            [facility] Adult PT Treatment/Exercise - 12/19/20 0001      Self-Care   Self-Care Other Self-Care Comments    Other Self-Care Comments  See patient education      Ankle Exercises: Standing   Other Standing Ankle Exercises Spent session attempting floor transfer from mat on ground with chair and low mat nearby - low mat used for BUE assist/support. Demonstration and heavy cues and encouragement throughout session (TC and VC). Patient able to lunge down onto BOSU ball placed on mat but unable to perform directly onto mat or on airex + pillow.      Ankle Exercises: Aerobic   Nustep L6 x 5 min LE only                  PT Education - 12/19/20 1347    Education Details HEP    Person(s) Educated Patient    Methods Explanation    Comprehension Verbalized understanding;Need further instruction            PT Short Term Goals - 10/09/20 1916      PT SHORT TERM GOAL #1   Title ankle DF to 5 deg    Baseline 10 deg 09/06/2020    Status Achieved      PT SHORT TERM GOAL #2   Title pt will demo full weight acceptance into Rt foot in static stance    Baseline able to demo full weight acceptance    Status Achieved             PT Long Term Goals - 12/14/20 1322      PT LONG TERM GOAL #1   Title Patient will be able to ambulate >/= 500 ft safely using LRAD    Baseline Patient using rollator full-time, currently only able to ambulate approximately 300 feet - continues to fatigue with shorter distances requiring seated break    Time 4   POC 6 weeks, incr time to accommodate MCD auth   Period Weeks    Status On-going      PT LONG TERM GOAL #2   Title Patient will report pain with ADLs <=2/10    Baseline Patient continues to report high level of pain with activity with 5/10 at rest this session    Time 4    Period Weeks    Status On-going      PT LONG TERM GOAL #3    Title pt will ambulate in the community using LRAD    Baseline Patient using rollator full-time, continues to be limited with community ambulation    Time 4  Period Weeks    Status On-going      PT LONG TERM GOAL #4   Title pt will demo SLS comfortably for at least 5s without UE assist to demo full weight acceptance as well as balance control for safety    Baseline Unable to perform SLS - heavy reliance on BUE for support for balance    Time 4    Period Weeks    Status On-going      PT LONG TERM GOAL #5   Title Patient will be able to negotiate 4 standard stairs using single railing to improve ability to enter and leave home safely    Baseline Patient able to negotiate small and large stairs using bilateral railings - heavy reliance on railings for support    Time 4    Period Weeks    Status On-going                 Plan - 12/19/20 1359    Clinical Impression Statement Continued focus on floor transfer from mat on floor using lower plinth for BUE support and assist. BOSU placed on floor mat to allow for lunge/half kneeling on raised surface. Patient was able to perform standing <> half kneeling with BOSU. After several repetitions with prolonged rest breaks inbetween, patient was attempting to stand from half kneeling but hesitated and placed L knee back onto BOSU, during which it scooted forward slightly. Patient was then encouraged to place knee directly onto mat as BOSU was too large to place between her LE and mat. Heavy cues and assist of 2 for patient to transfer from floor mat to low mat/plinth at slow pace. Patient has ability to demonstrate optimal foot placement and can begin to push up as she did very well pushing up through LE at beginning of session, but she hesitates to do so because of fear of recurrent injury and anxiety. Will plan to practice lunging and squatting mechanics again prior to floor transfer attempt. She should continue to benefit from skilled PT  intervention for practice with these functional tasks, such as returning to standing in case of fall on floor, to improve her overall safety at home and in the community.   Personal Factors and Comorbidities Fitness;Time since onset of injury/illness/exacerbation;Comorbidity 1;Education    Comorbidities mental disorder    Examination-Activity Limitations Sit;Squat;Stairs;Stand;Transfers;Locomotion Level    PT Frequency 1x / week    PT Duration 4 weeks    PT Treatment/Interventions ADLs/Self Care Home Management;Cryotherapy;Electrical Stimulation;Gait training;Neuromuscular re-education;Therapeutic exercise;Therapeutic activities;Functional mobility training;Stair training;Patient/family education;Manual techniques;Taping;Passive range of motion;Scar mobilization;Vasopneumatic Device    PT Next Visit Plan Floor transfers, lunging, squatting, sit<>stand    PT Home Exercise Plan [id number]    Consulted and Agree with Plan of Care Patient           Patient will benefit from skilled therapeutic intervention in order to improve the following deficits and impairments:  Abnormal gait,Decreased range of motion,Difficulty walking,Decreased activity tolerance,Pain,Improper body mechanics,Impaired flexibility,Decreased strength  Visit Diagnosis: Status post open reduction with internal fixation (ORIF) of fracture of ankle  Pain in right ankle and joints of right foot  Difficulty in walking, not elsewhere classified  Localized edema     Problem List Patient Active Problem List   Diagnosis Date Noted  . Acute blood loss anemia   . Leukopenia   . Vitamin D deficiency   . Acute lower UTI   . Postoperative pain   . Tibia/fibula fracture, right, sequela 04/15/2020  .  Closed fracture of medial malleolus of right ankle 04/10/2020  . Open fracture of distal end of fibula and tibia, right, type I or II, initial encounter 04/09/2020  . Vaginal cancer ([place]) 07/22/2019  . Morbid obesity with BMI of  40.0-44.9, adult ([place]) 06/11/2019  . 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 Visit for routine gyn exam 04/21/2019  . Vaginal atrophy 04/21/2019  . Painful lumpy right breast 07/27/2016  . Urinary tract infection 07/01/2015  . Diastolic dysfunction 76/73/4193  . Constipation 05/12/2015  . ESBL (extended spectrum beta-lactamase) producing bacteria infection 03/17/2015  . Diarrhea 03/03/2015  . Weakness 03/02/2015  . 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 Anxiety state 02/20/2015  . Claustrophobia 02/20/2015  . Transaminitis 02/18/2015  . Chest pain 02/18/2015  . Abdominal pain   . Dizziness   . Hypotension 01/04/2015  . Bipolar disorder ([place]) 06/29/2012   Haydee Monica, PT, DPT 01/10/21 2:33 PM  [facility] [address] [place], [place], [address] Phone: [phone]   Fax:  [phone]  Name: Jordan [name] MRN: [id number] Date of Birth: November 25, 1965

## 2020-12-19 NOTE — Telephone Encounter (Signed)
Called to return VM and check-in with patient.  [name] Counseling Intern

## 2020-12-19 NOTE — Telephone Encounter (Signed)
Patient called and stated "I still have an odor down there. I forgot to leave a sample at the UTI doctor when I was there with my boyfriend. Can I just come there to see that person Dr Denman George wanted me to see." Appt scheduled

## 2020-12-20 ENCOUNTER — Encounter: Payer: Self-pay | Admitting: Obstetrics & Gynecology

## 2020-12-20 ENCOUNTER — Other Ambulatory Visit: Payer: Self-pay

## 2020-12-20 ENCOUNTER — Inpatient Hospital Stay: Payer: Medicaid Other | Attending: Obstetrics & Gynecology | Admitting: Obstetrics & Gynecology

## 2020-12-20 ENCOUNTER — Inpatient Hospital Stay: Payer: Medicaid Other

## 2020-12-20 VITALS — BP 125/94 | HR 86 | Temp 96.8°F | Resp 16 | Wt 255.0 lb

## 2020-12-20 DIAGNOSIS — C52 Malignant neoplasm of vagina: Secondary | ICD-10-CM | POA: Diagnosis not present

## 2020-12-20 DIAGNOSIS — F319 Bipolar disorder, unspecified: Secondary | ICD-10-CM | POA: Insufficient documentation

## 2020-12-20 DIAGNOSIS — G8929 Other chronic pain: Secondary | ICD-10-CM | POA: Insufficient documentation

## 2020-12-20 DIAGNOSIS — Z90721 Acquired absence of ovaries, unilateral: Secondary | ICD-10-CM | POA: Diagnosis not present

## 2020-12-20 DIAGNOSIS — Z9079 Acquired absence of other genital organ(s): Secondary | ICD-10-CM | POA: Diagnosis not present

## 2020-12-20 DIAGNOSIS — Z90710 Acquired absence of both cervix and uterus: Secondary | ICD-10-CM | POA: Diagnosis not present

## 2020-12-20 DIAGNOSIS — Z79899 Other long term (current) drug therapy: Secondary | ICD-10-CM | POA: Diagnosis not present

## 2020-12-20 DIAGNOSIS — Z923 Personal history of irradiation: Secondary | ICD-10-CM | POA: Diagnosis not present

## 2020-12-20 DIAGNOSIS — Z87440 Personal history of urinary (tract) infections: Secondary | ICD-10-CM | POA: Diagnosis not present

## 2020-12-20 DIAGNOSIS — N898 Other specified noninflammatory disorders of vagina: Secondary | ICD-10-CM

## 2020-12-20 LAB — URINALYSIS, ROUTINE W REFLEX MICROSCOPIC
Bilirubin Urine: NEGATIVE
Glucose, UA: NEGATIVE mg/dL
Ketones, ur: 20 mg/dL — AB
Nitrite: NEGATIVE
Protein, ur: 100 mg/dL — AB
Specific Gravity, Urine: 1.016 (ref 1.005–1.030)
WBC, UA: >50 WBC/hpf — ABNORMAL HIGH (ref 0–5)
pH: 6 (ref 5.0–8.0)

## 2020-12-20 LAB — WET PREP, GENITAL
Clue Cells Wet Prep HPF POC: NONE SEEN
Sperm: NONE SEEN
Trich, Wet Prep: NONE SEEN
WBC, Wet Prep HPF POC: NONE SEEN
Yeast Wet Prep HPF POC: NONE SEEN

## 2020-12-20 NOTE — Progress Notes (Signed)
Follow Up Note: Gyn-Onc  Jordan [name] 55 y.o. female  CC:  Chief Complaint  Patient presents with  . Vaginal odor  . Vaginal cancer ([place])    HPI:  Oncology History  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[place])  05/28/2019 Initial Diagnosis   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HCC), Stage IA   06/19/2019 Cancer Staging   staging CT scan of the chest abdomen and pelvis.  This revealed that she was status post hysterectomy with no evidence of metastatic disease in the abdomen or pelvis or chest.   06/26/2019 Imaging   MRI of the pelvis with IV contrast: susceptibility artifact in the vaginal cuff from suture material limiting evaluation.  However no focal soft tissue masses were seen involving the vaginal cuff and no abnormal soft tissue densities in the parametrial regions.  The right and left ovaries were grossly normal.   07/22/2019 Surgery   Robotic assisted radical upper vaginectomy with bilateral pelvic sentinel lymph node biopsy, right salpingo-oophorectomy, left salpingectomy.  Intraoperative findings were significant for no grossly visible vaginal lesions.  The ovaries and fallopian tubes were adherent to the rectum and vaginal cuff in the obliterated cul-de-sac.  The ovaries themselves are grossly normal-appearing but densely adherent with no suspicious lymph nodes.  There is clinical stage I disease, microscopic.   07/22/2019 Cancer Staging   Postoperatively final pathology returned as FIGO stage Ia microscopic poorly differentiated squamous cell carcinoma.  The tumor site was the posterior vaginal wall and measured 0.6 cm.  The posterior margin (the posterior vaginal cuff margin) was broadly positive for tumor cells.  The deep margin was negative.  There was 2.5 mm depth of invasion.  All sentinel lymph nodes were negative for metastases, as was the left fallopian tube and right tube and ovary.   08/24/2019 -  09/22/2019 Radiation Therapy   Vaginal brachytherapy   09/15/2020 PET scan   Soft tissue attenuating structure is noted within the left pelvis measuring 1.7 cm. This was favored to be a left ovary. No clear evidence for recurrence.      Interval History: She denies any vaginal bleeding/discharge, abdominal/pelvic pain, leg pain, cough or weight loss.  History of possible rUTIs, incontinence.  Wear incontinence undergarments.  She has chronic vaginal/genital malodor.  The odor is stronger over the past month.    Review of Systems  Review of Systems  Constitutional: Negative for weight loss.  Respiratory: Negative for cough.   Genitourinary:       Negative for vaginal bleeding  Musculoskeletal:       Negative for leg pain    Current Meds:  Outpatient Encounter Medications as of 12/20/2020  Medication Sig  . Ferrous Sulfate (IRON) 325 (65 Fe) MG TABS Take 1 tablet by mouth every other day.  . gabapentin (NEURONTIN) 100 MG capsule Take 1 capsule (100 mg total) by mouth 3 (three) times daily.  Marland Kitchen oxybutynin (DITROPAN-XL) 10 MG 24 hr tablet Take 10 mg by mouth daily.  . Oxycodone HCl 10 MG TABS Take 1-1.5 tablets (10-15 mg total) by mouth 4 (four) times daily as needed.  . [DISCONTINUED] cholecalciferol (VITAMIN D) 25 MCG tablet Take 1 tablet (1,000 Units total) by mouth daily.  Marland Kitchen acetaminophen (TYLENOL) 325 MG tablet Take 1-2 tablets (325-650 mg total) by mouth every 4 (four) hours as needed for mild pain. (Patient not taking: Reported on 12/19/2020)  . lurasidone (LATUDA) 20 MG TABS tablet Take 1 tablet (20 mg total) by mouth daily. (Patient not taking: Reported  on 12/19/2020)  . ondansetron (ZOFRAN) 4 MG tablet Take 1 tablet (4 mg total) by mouth every 6 (six) hours as needed for nausea. (Patient not taking: No sig reported)  . Vitamin D, Ergocalciferol, (DRISDOL) 1.25 MG (50000 UNIT) CAPS capsule Take 50,000 Units by mouth. Every 14 days  . [DISCONTINUED] ALPRAZolam (XANAX) 0.25 MG tablet Take  1 tablet (0.25 mg total) by mouth 2 (two) times daily as needed for anxiety.  . [DISCONTINUED] ibuprofen (ADVIL) 800 MG tablet Take 1 tablet (800 mg total) by mouth every 8 (eight) hours as needed.  . [DISCONTINUED] naproxen (NAPROSYN) 250 MG tablet Take 1 tablet (250 mg total) by mouth 2 (two) times daily with a meal.  . [DISCONTINUED] phenazopyridine (PYRIDIUM) 200 MG tablet Take 1 tablet by mouth 3 (three) times daily as needed. (Patient not taking: Reported on 09/19/2020)   No facility-administered encounter medications on file as of 12/20/2020.    Allergy:  Allergies  Allergen Reactions  . Ciprofloxacin Swelling    Lips swell, tongue swells, face swells  . Citalopram Other (See Comments)    Possible cause of pancytopenia. Swelling of tongue, face and throat  . Lamotrigine Other (See Comments)    Possible cause of pancytopenia. Swelling of face, throat and tongue  . Sulfa Antibiotics Anaphylaxis  . Tramadol Anaphylaxis, Shortness Of Breath and Swelling    Social Hx:   Social History   Socioeconomic History  . Marital status: Soil scientist    Spouse name: Not on file  . Number of children: Not on file  . Years of education: Not on file  . Highest education level: Not on file  Occupational History  . Not on file  Tobacco Use  . Smoking status: Never Smoker  . Smokeless tobacco: Never Used  Vaping Use  . Vaping Use: Never used  Substance and Sexual Activity  . Alcohol use: Not Currently  . Drug use: No  . Sexual activity: Yes    Birth control/protection: Post-menopausal, Surgical    Comment: perimenopausal; no sex in years  Other Topics Concern  . Not on file  Social History Narrative  . Not on file   Social Determinants of Health   Financial Resource Strain: Not on file  Food Insecurity: Not on file  Transportation Needs: Not on file  Physical Activity: Not on file  Stress: Not on file  Social Connections: Not on file  Intimate Partner Violence: Not on file     Past Surgical Hx:  Past Surgical History:  Procedure Laterality Date  . ABDOMINAL HYSTERECTOMY    . CERVICAL CONIZATION W/BX N/A 07/01/2016   Procedure: CONIZATION CERVIX WITH BIOPSY;  Surgeon: Chancy Milroy, MD;  Location: [facility];  Service: Gynecology;  Laterality: N/A;  . CHOLECYSTECTOMY    . EXTERNAL FIXATION LEG Right 04/09/2020   Procedure: EXTERNAL FIXATION LEG;  Surgeon: Hiram Gash, MD;  Location: [place];  Service: Orthopedics;  Laterality: Right;  . HYSTEROSCOPY WITH D & C N/A 01/25/2016   Procedure: DILATATION AND CURETTAGE /HYSTEROSCOPY;  Surgeon: Mora Bellman, MD;  Location: [facility];  Service: Gynecology;  Laterality: N/A;  . LYMPH NODE BIOPSY Bilateral 07/22/2019   Procedure: LYMPH NODE BIOPSY;  Surgeon: Everitt Amber, MD;  [facility];  Service: Gynecology;  Laterality: Bilateral;  . OPEN REDUCTION INTERNAL FIXATION (ORIF) TIBIA/FIBULA FRACTURE Right 04/10/2020   Procedure: OPEN REDUCTION INTERNAL FIXATION (ORIF) TIBIA/FIBULA FRACTURE;  Surgeon: Shona Needles, MD;  Location: [place];  Service: Orthopedics;  Laterality: Right;  .  ROBOT ASSISTED MYOMECTOMY N/A 07/22/2019   Procedure: XI ROBOTIC ASSISTED LAPAROSCOPIC RADICAL UPPER VAGINECTOMY, LEFT SALPINECTOMY, RIGHT SALPINGOOOPHERECTOMY;  Surgeon: Everitt Amber, MD;  [facility];  Service: Gynecology;  Laterality: N/A;  . VAGINAL HYSTERECTOMY N/A 03/11/2017   Procedure: HYSTERECTOMY VAGINAL WITH MORCELLATION;  Surgeon: Chancy Milroy, MD;  Location: [facility];  Service: Gynecology;  Laterality: N/A;    Past Medical Hx:  Past Medical History:  Diagnosis Date  . Alcohol abuse   . Anemia    patient denies  . Bipolar 1 disorder (HCC)    No medications currently  . CAP (community acquired pneumonia) 03/17/2015  . 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 Mental disorder   . Obesity   . PICC line infection 05/17/2015  . Sepsis due to Gram negative bacteria (MDR E Coli) 02/18/2015  . Squamous cell  carcinoma of vagina ([place])   . UTI (lower urinary tract infection)   . Vaginal Pap smear, abnormal     Family Hx:  Family History  Problem Relation Age of Onset  . Alcohol abuse Father   . Cancer Father   . Breast cancer Neg Hx     Vitals:  Blood pressure (!) 125/94, pulse 86, temperature (!) 96.8 F (36 C), temperature source Tympanic, resp. rate 16, weight 255 lb (115.7 kg), SpO2 99 %.  Physical Exam:  Physical Exam Exam conducted with a chaperone present.  Abdominal:     General: There is no distension.     Palpations: There is no mass.     Tenderness: There is no abdominal tenderness.     Comments: Red plaque under the pannus/inner thigh folds  Genitourinary:    Exam position: Lithotomy position.     Labia:        Right: No rash.        Left: No rash.      Vagina: Vaginal discharge (Thin, yellow-tinged) present. No tenderness or lesions.     Adnexa: Right adnexa normal and left adnexa normal.       Left: No mass.       Rectum: No mass.  Musculoskeletal:        General: No tenderness.     Right lower leg: No edema.     Left lower leg: No edema.  Lymphadenopathy:     Cervical:     Right cervical: No superficial cervical adenopathy.    Left cervical: No superficial cervical adenopathy.     Lower Body: No right inguinal adenopathy. No left inguinal adenopathy.  Neurological:     Mental Status: She is alert.     Assessment/Plan:  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HCC) H/O Stage IA SCC of vagina  Normal exam.  No evidence of recurrence Genital malodor--?etiology.  DDx--BV in the setting of h/o RT, urinary incontinence, excessive genital perspiration and local bacterial colonization related to obesity Candida intertrigo   >review the repeat pelvic MRI findings >review the wet prep findings >education materials provided re: candida intertrigo >consider a trial of antibiotics for anaerobic infection; may be a candidate for tap water douching daily x 1 mth, vaginal  estrogen therapy >continue q 3 mth f/u for 2 yrs following completion of treatment; annual cytology in 10/22 >see Dr Sondra Come in 3 mths; f/u w/GYN-ONC in 6 mths   I personally spent 30 minutes face-to-face and non-face-to-face in the care of this patient, which includes all pre, intra, and post visit time on the date of service.  Lahoma Crocker,  MD 12/20/2020, 1:49 PM

## 2020-12-20 NOTE — Assessment & Plan Note (Addendum)
H/O Stage IA SCC of vagina  Normal exam.  No evidence of recurrence Genital malodor--?etiology.  DDx--BV in the setting of h/o RT, urinary incontinence, excessive genital perspiration and local bacterial colonization related to obesity Candida intertrigo   >review the repeat pelvic MRI findings >review the wet prep findings >education materials provided re: candida intertrigo >consider a trial of antibiotics for anaerobic infection; may be a candidate for tap water douching daily x 1 mth, vaginal estrogen therapy >continue q 3 mth f/u for 2 yrs following completion of treatment; annual cytology in 10/22 >see Dr Sondra Come in 3 mths; f/u w/GYN-ONC in 6 mths

## 2020-12-20 NOTE — Patient Instructions (Signed)
INTERTRIGO What is Intertrigo and why does it develop? Intertrigo is inflammation of the skin caused by irritation in body folds--especially the groin, under the breasts, the armpits, under the abdominal fold, and around the anal area. In many people, a yeast called Candida albicans aggravates the condition. This yeast does not normally live on the skin and will not survive on normal dry skin. However, in warm, moist body folds the yeast infection may develop. Intertrigo can occur at any age in both men and women. In infants, intertrigo affecting the groin area is called a diaper rash.  What are the symptoms? Marland Kitchen The rash is bright red and varies from dry and flaky to moist and oozing. . Itching and burning are common.  How is it treated? If the skin is weeping: . Apply a cold water compress (use a clean washcloth soaked in cold tap water) or [place] compresses (available OTC; use the 1 to 40 dilution). . Leave the compresses on for 10-15 minutes. . Repeat the compresses twice a day until the rash has dried and is no longer oozing. . After the compresses, dry the area completely and apply the topical creams twice daily (see below). . If you are not doing compresses, just apply the creams to the rash area twice daily (see below). Topical creams: Miconazole cream AND Hydrocortisone 1% cream (both available OTC).   What can I do to prevent or control the intertrigo? As long as you have body folds touching each other, you will continue to be proned to intertrigo. Keeping your skin clean and dry can often prevent intertrigo. Follow these simple suggestions: Marland Kitchen Use a blow dryer on a low-setting to dry the body fold areas after bathing. Marland Kitchen Sprinkle a drying powder such as Zeasorb-AF powder on these areas. . Wear loose-fitting clothes. . Weight loss will be beneficial to minimize the body folds rubbing each other. . Throughout the day, if you find yourself sweating in the body fold areas, you  should blot the moisture and sweat with a tissue to keep the area as dry as possible.  Return in 6 mths

## 2020-12-20 NOTE — Addendum Note (Signed)
Addended by: Lahoma Crocker on: 12/20/2020 02:54 PM   Modules accepted: Orders

## 2020-12-21 ENCOUNTER — Encounter: Payer: Self-pay | Admitting: *Deleted

## 2020-12-21 LAB — URINE CULTURE

## 2020-12-22 ENCOUNTER — Telehealth: Payer: Self-pay

## 2020-12-22 NOTE — Telephone Encounter (Signed)
Told Jordan [name] that the Wet prep was normal and the urine culture suggests that it was contaminated and needs to be redone. She needs to f/u with Urologist at [facility] if symptoms persist. [place] said that she has an appointment there on Monday 12-25-20.

## 2020-12-22 NOTE — Telephone Encounter (Signed)
Pt called to confirm MRI appt. 3/14 at 1:00. Pt instructed to arrive at 12;30, 30 mins prior to her appt and nothing to eat for 4 hours prior to her appt.

## 2020-12-25 ENCOUNTER — Ambulatory Visit: Payer: Self-pay | Admitting: Radiation Oncology

## 2020-12-25 ENCOUNTER — Telehealth: Payer: Self-pay | Admitting: *Deleted

## 2020-12-25 ENCOUNTER — Other Ambulatory Visit: Payer: Self-pay | Admitting: Nurse Practitioner

## 2020-12-25 DIAGNOSIS — Z1231 Encounter for screening mammogram for malignant neoplasm of breast: Secondary | ICD-10-CM

## 2020-12-25 NOTE — Telephone Encounter (Signed)
Patient called and stated "I'm starting to go through menopause. I am having hot/cold flashes and mood swings. Who Do I need to see for help for this. I no longer have a gyn doctor." Explained that someone would call her back later today or tomorrow.

## 2020-12-26 ENCOUNTER — Telehealth: Payer: Self-pay

## 2020-12-26 DIAGNOSIS — N3941 Urge incontinence: Secondary | ICD-10-CM | POA: Insufficient documentation

## 2020-12-26 NOTE — Telephone Encounter (Signed)
Counselor provided active listening and validation of feelings. Counselor and patient still plan to meet on 3/10.  [name] Counseling Intern

## 2020-12-26 NOTE — Progress Notes (Signed)
[place] Patient and [facility] Counseling Note  The patient described her interactions with her family, as well as her ongoing frustrations with her boyfriend's nursing home. The patient also described thinking mental health care professionals doubt her and treat her like "crap." The patient also described feeling like she has "lost" her boyfriend and they no longer connect the way they used to. The counselor provided active listening, reflections, and validation of feelings. The patient was oriented times three, and in a sad, frustrated mood. Her affect was mostly congruent in this session. There were no indications of SI/HI/NSSI. agitated mood. Her affect was exaggerated and she showed no signs of SI/HI/NSSI. Patient does not understand the ways that people communicate with her, so she has thoughts like "They think I'm stupid," "They are mistreating me," or "I am stupid." These thoughts bring up anger, hurt, sadness and more. These feelings result in the patient lashing out, further distancing herself from others.The anger she experiences appears to act as a shield to keep herself from being vulnerable. If patient can increase positive interactions with others, these feelings may decrease. Next session will discuss updates and status of patient.  [name] Counseling Intern

## 2020-12-27 ENCOUNTER — Telehealth: Payer: Self-pay | Admitting: *Deleted

## 2020-12-27 NOTE — Telephone Encounter (Signed)
Patient called and stated "I'm starting to go through menopause. I am having hot/cold flashes and mood swings. Who Do I need to see for help for this. I no longer have a gyn doctor." Explained that someone would call her back later today or tomorrow.

## 2020-12-27 NOTE — Progress Notes (Signed)
Returned patients phone call .She wanted to make sure that her appointment was on 12/28/2020 @ 4 pm.

## 2020-12-28 ENCOUNTER — Encounter: Payer: Self-pay | Admitting: General Practice

## 2020-12-28 ENCOUNTER — Ambulatory Visit
Admission: RE | Admit: 2020-12-28 | Discharge: 2020-12-28 | Disposition: A | Discharge status: Home / Self Care | Payer: Medicaid Other | Source: Ambulatory Visit | Attending: Radiation Oncology | Admitting: Radiation Oncology

## 2020-12-28 ENCOUNTER — Other Ambulatory Visit: Payer: Self-pay

## 2020-12-28 ENCOUNTER — Encounter: Payer: Self-pay | Admitting: Radiation Oncology

## 2020-12-28 ENCOUNTER — Telehealth: Payer: Self-pay | Admitting: *Deleted

## 2020-12-28 VITALS — BP 112/84 | HR 88 | Temp 97.0°F | Resp 18 | Ht 68.0 in

## 2020-12-28 DIAGNOSIS — N898 Other specified noninflammatory disorders of vagina: Secondary | ICD-10-CM | POA: Insufficient documentation

## 2020-12-28 DIAGNOSIS — Z79899 Other long term (current) drug therapy: Secondary | ICD-10-CM | POA: Insufficient documentation

## 2020-12-28 DIAGNOSIS — Z8589 Personal history of malignant neoplasm of other organs and systems: Secondary | ICD-10-CM | POA: Insufficient documentation

## 2020-12-28 DIAGNOSIS — C52 Malignant neoplasm of vagina: Secondary | ICD-10-CM

## 2020-12-28 NOTE — Progress Notes (Signed)
[place] Patient and [facility] Counseling Note  The patient stated her "nerves are really bad," so counselor introduced basic deep breathing skills and encouraged patient to try when she feels really upset or angry. The counselor and patient discussed frustrations, concerns with doctors, and relationship with a significant other. The counselor provided empathic listening, validation of feelings, and reflections. The patient was oriented times three and in an angry mood. Her affect was mostly labile, but less so than in previous sessions. No evidence of SI/HI/NSSI was present. Patient does not understand the ways that people communicate with her, so she has thoughts like "They think I'm stupid," "They are mistreating me," or "I am stupid." These thoughts bring up anger, hurt, sadness and more. These feelings result in the patient lashing out, further distancing herself from others.The anger she experiences appears to act as a shield to keep herself from being vulnerable. If patient can increase positive interactions with others, these feelings may decrease. Next session will discuss deep breathing and current stressors.   [name] Counseling Intern

## 2020-12-28 NOTE — Progress Notes (Signed)
Weight and vitals stable. Denies pain. Reports vaginal odor continues to be very strong x 2 months. Denies vaginal itching or discharge. Denies vaginal bleeding. Reports continued bladder spasms. Reports an intermittent urine stream. Reports having to strain to start urination at time and empty. Denies dysuria or hematuria. Denies diarrhea or constipation. Patient denies securing a GYN physician.   BP 112/84 (BP Location: Left Arm, Patient Position: Sitting)   Pulse 88   Temp (!) 97 F (36.1 C) (Temporal)   Resp 18   Ht 5\' 8"  (1.727 m)   LMP  (LMP Unknown)   SpO2 97%   BMI 38.77 kg/m  Wt Readings from Last 3 Encounters:  12/20/20 255 lb (115.7 kg)  09/19/20 288 lb (130.6 kg)  08/13/20 (!) 340 lb (154.2 kg)

## 2020-12-28 NOTE — Progress Notes (Signed)
[facility] Spiritual Care Note  Returned Jordan [name]'s calls at a time inconvenient for her, so she plans to call back when needed.   [place], [place], [facility] Pager [phone] Voicemail [phone]

## 2020-12-28 NOTE — Telephone Encounter (Signed)
Returned the patient's call and left a message for the patient explaining Dr Denman George wants her to keep the MRI appt

## 2020-12-28 NOTE — Telephone Encounter (Signed)
Returned patient's call. Patient c/o continued vaginal odor, mood swings, and hot/cold flashes.  Patient saw urologist on 12/26/2020 and was prescribed antibiotics for UTI.  Patient stated she has not picked up antibiotics yet.  Patient encouraged to pick up antibiotics as UTI may be causing odor.  Patient informed to see PCP or gyn for hormone replacement.  Patient verbalized understanding of information provided.

## 2020-12-28 NOTE — Progress Notes (Signed)
Radiation Oncology         [phone] ________________________________  Name: Jordan [name] MRN: [id number]         Date: 06/19/2020                      DOB: 04-09-1966  Follow-Up Visit Note  CC: [name], NP  Everitt Amber, MD    ICD-10-CM   1. 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 Since her last visit, she was seen in the ED on 08/13/2020 for evaluation of lower abdominal pain and blood noted in toilet. Her symptoms were suspected to be related to an ongoing UTI.  She underwent a screening mammogram on 08/21/2020 that was negative for malignancy.   PET scan on 09/15/2020 showed soft tissue attenuating structure noted within the left pelvis that measured 1.7 cm, felt to likely represent the patient's remaining left ovary, which may have been adherent to the vaginal cuff and subsequently released. Metastatic adenopathy was considered less favored.  She was last seen by [name] on 09/19/2020. It was recommended that she undergo an MRI of the pelvis to evaluate the left adnexa, which is scheduled for 01/01/2021.  On review of systems, she reports a vaginal discharge which sometimes is malodorous.  She denies any vaginal bleeding or pelvic pain.  She reports undergoing cystoscopy by Dr. Amalia Hailey in [place].  Per discussion with the patient there was no signs of cancer in the bladder. She denies recent hematuria.  She reports having recurrent urinary tract infections managed by urology.Marland Kitchen  She wishes to switch her urologist to a  urologist here in [place] and will speak with [name] concerning this issue.  She did see Dr. Delsa Sale earlier this month and no evidence of recurrence was found.  ALLERGIES:  is allergic to ciprofloxacin, citalopram, lamotrigine, sulfa antibiotics, and tramadol.  Meds:       Current Outpatient Medications  Medication Sig Dispense Refill  . acetaminophen (TYLENOL) 325 MG tablet Take 1-2 tablets (325-650 mg total) by mouth every 4 (four) hours as needed for mild pain.    Marland Kitchen ALPRAZolam (XANAX) 0.25 MG tablet Take 1 tablet (0.25 mg total) by mouth 2 (two) times daily as needed for anxiety. 6 tablet 0  . cholecalciferol (VITAMIN D) 25 MCG tablet Take 1 tablet (1,000 Units total) by mouth daily.    Marland Kitchen enoxaparin (LOVENOX) 40 MG/0.4ML injection Inject 0.4 mLs (40 mg total) into the skin daily. 0 mL   . Ferrous Sulfate (IRON) 325 (65 Fe) MG TABS Take 1 tablet by mouth every other day.    . gabapentin (NEURONTIN) 100 MG capsule Take 1 capsule (100 mg total) by mouth 3 (three) times daily.    Marland Kitchen lurasidone (LATUDA) 20 MG TABS tablet Take 1 tablet (20 mg total) by mouth daily. 5 tablet 0  . naproxen (NAPROSYN) 250 MG tablet Take 1 tablet (250 mg total) by mouth 2 (two) times daily with a meal.    . ondansetron (ZOFRAN) 4 MG tablet Take 1 tablet (  4 mg total) by mouth every 6 (six) hours as needed for nausea. 20 tablet 0  . OXcarbazepine (TRILEPTAL) 150 MG tablet Take 0.5 tablets (75 mg total) by mouth in the morning and at bedtime. 10 tablet 0  . oxybutynin (DITROPAN-XL) 10 MG 24 hr tablet Take 10 mg by mouth daily.    . Oxycodone HCl 10 MG TABS Take 1-1.5 tablets (10-15 mg total) by mouth 4 (four) times daily as needed. 20 tablet 0  . saccharomyces boulardii (FLORASTOR) 250 MG capsule Take 1 capsule (250 mg total) by mouth 2 (two) times daily.     No current facility-administered medications for this encounter.    Physical Findings: The patient is in no acute distress.  Patient is alert and oriented. Lungs are clear to auscultation bilaterally. Heart has regular rate and rhythm. No palpable cervical, supraclavicular, or axillary adenopathy. Abdomen soft, non-tender, normal bowel sounds.  On pelvic examination the external genitalia were unremarkable. A speculum exam was performed. There are no mucosal lesions noted in the vaginal vault.  On bimanual  examination there were no pelvic masses appreciated.  The patient continues to be tender with palpation in the proximal vagina.  Surgical changes palpated but no mass is appreciated.  Lab Findings: Recent Labs       Lab Results  Component Value Date   WBC 3.8 (L) 05/03/2020   HGB 11.0 (L) 05/03/2020   HCT 34.5 (L) 05/03/2020   MCV 99.7 05/03/2020   PLT 212 05/03/2020      Radiographic Findings: Imaging Results  No results found.    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  She is not using the vaginal cylinder and we have given her an additional couple to try.  She seems to be having less discomfort with exam and may be able to tolerate the vaginal dilator.  Plan: Patient will follow-up with [name] in 3 months with pap smear planned in the fall.  and follow-up with radiation oncology in six months.  Total time spent in this encounter was 25 minutes which included reviewing the patient's most recent follow-up with Dr. Delsa Sale, ED visit, PET scan, screening mammogram, physical examination, and documentation.  ____________________________________   Blair Promise, PhD, MD  This document serves as a record of services personally performed by Gery Pray, MD. It was created on his behalf by Clerance Lav, a trained medical scribe. The creation of this record is based on the scribe's personal observations and the provider's statements to them. This document has been checked and approved by the  attending provider.

## 2021-01-01 ENCOUNTER — Telehealth: Payer: Self-pay | Admitting: *Deleted

## 2021-01-01 ENCOUNTER — Ambulatory Visit (HOSPITAL_COMMUNITY)
Admission: RE | Admit: 2021-01-01 | Discharge: 2021-01-01 | Disposition: A | Discharge status: Home / Self Care | Payer: Medicaid Other | Source: Ambulatory Visit | Attending: Obstetrics & Gynecology | Admitting: Obstetrics & Gynecology

## 2021-01-01 ENCOUNTER — Other Ambulatory Visit: Payer: Self-pay

## 2021-01-01 DIAGNOSIS — C52 Malignant neoplasm of vagina: Secondary | ICD-10-CM | POA: Diagnosis present

## 2021-01-01 MED ORDER — GADOBUTROL 1 MMOL/ML IV SOLN
10.0000 mL | Freq: Once | INTRAVENOUS | Status: AC | PRN
  Administered 2021-01-01: 10 mL via INTRAVENOUS

## 2021-01-01 NOTE — Telephone Encounter (Signed)
Patient called to confirm her appt date and time for 6/17 at 1 pm

## 2021-01-01 NOTE — Telephone Encounter (Signed)
Patient called the office back and wanted to know if Dr Denman George will meet with her for the MRI results. Per Dr Denman George, patient to be scheduled with Dr Delsa Sale on 3/23. Appt scheduled and patient aware

## 2021-01-01 NOTE — Telephone Encounter (Signed)
RETURNED PATIENT'S PHONE CALL, LVM FOR A RETURN CALL 

## 2021-01-03 ENCOUNTER — Telehealth: Payer: Self-pay | Admitting: Oncology

## 2021-01-03 NOTE — Telephone Encounter (Signed)
Jordan [name] called and double checked her appointment time for 01/10/21 with Dr. Delsa Sale.

## 2021-01-04 ENCOUNTER — Ambulatory Visit: Payer: Medicaid Other

## 2021-01-04 ENCOUNTER — Other Ambulatory Visit: Payer: Self-pay

## 2021-01-04 ENCOUNTER — Encounter: Payer: Self-pay | Admitting: General Practice

## 2021-01-04 NOTE — Progress Notes (Signed)
[place] Patient and [facility] Counseling Note  The counselor and patient spent time processing patient experiences of dismissal and pain. The patient described concerns with doctors, her SO, and her family. The patient reported that her family had always called or treated her like she was "stupid" or "dumb." The counselor provided psychoeducation, empathic listening, reflections, a safe space to talk, and validated patient feelings. The patient was oriented times three and in a mostly annoyed mood. Her affect was largely congruent and she showed no SI/HI/NSSI. Patient does not understand the ways that people communicate with her, so she has thoughts like "They think I'm stupid," "They are mistreating me," or "I am stupid." These thoughts bring up anger, hurt, sadness and more. These feelings result in the patient lashing out, further distancing herself from others.The anger she experiences appears to act as a shield to keep herself from being vulnerable. If patient can increase positive interactions with others, these feelings may decrease. Next session will discuss sibling relationships and current stressors.   [name] Counseling Intern

## 2021-01-04 NOTE — Progress Notes (Signed)
[facility] Spiritual Care Note  Received a pastoral check-in call from [place], providing empathic listening and emotional support. She plans to follow up by phone as needed.   [place], [place], [facility] Pager [phone] Voicemail [phone]

## 2021-01-05 ENCOUNTER — Telehealth: Payer: Self-pay | Admitting: *Deleted

## 2021-01-05 NOTE — Telephone Encounter (Signed)
Patient called to confirm her date/time for 3/23

## 2021-01-08 ENCOUNTER — Encounter: Payer: Self-pay | Admitting: General Practice

## 2021-01-08 ENCOUNTER — Telehealth: Payer: Self-pay | Admitting: *Deleted

## 2021-01-08 NOTE — Telephone Encounter (Signed)
Patient called earlier today to confirm her appt date/time for Wednesday 3/23 at 1 pm

## 2021-01-08 NOTE — Progress Notes (Signed)
[facility] Spiritual Care Note  Received and returned call from [place] with the requested information that her next appointment with [facility] Fogleman/Counseling Intern is Thursday, March 24 at [facility] [name], [facility] Pager [phone] Voicemail [phone]

## 2021-01-09 ENCOUNTER — Encounter: Payer: Self-pay | Admitting: Obstetrics & Gynecology

## 2021-01-09 ENCOUNTER — Telehealth: Payer: Self-pay | Admitting: *Deleted

## 2021-01-09 NOTE — Assessment & Plan Note (Addendum)
H/O Stage IA SCC of vagina Normal exam.  Recent imaging negative for recurrence Chronic pain in the setting RT; history suggestive of a chronic bladder pain syndrome  >attempt to titrate gabapentin >referral-->PFPT >continue urinary analgesics >f/u w/Urology >keep next scheduled appointment w/[name]

## 2021-01-09 NOTE — Telephone Encounter (Signed)
Patient called to report that she is still having the vaginal discomfort and pain.  She states she completed her antibiotic for UTI but the pain is still persisting.  She states that today when she wiped she also had some blood during wiping.    Patient is scheduled to see Dr. Delsa Sale at [facility] tomorrow.  Called and spoke with nurse with that doctor to let her know the details and per Dr Sondra Come he asks if she can evaluate this tomorrow when the patient is in for her visit.    Communicated same information to the patient. Patient began crying while on the phone stating she is in a lot of pain.  Encourage hydration/cranberry juice/tylenol or motrin and to ensure that she follows through with her appointment tomorrow.  She asked what would happen if she went to the emergency room.  Explained that if she did end up doing that then her visit with [name] might not be necessary as scheduled tomorrow.    Patient became upset and hung up.

## 2021-01-09 NOTE — Progress Notes (Signed)
Follow Up Note: Gyn-Onc  Jordan [name] 55 y.o. female  CC:  No chief complaint on file.   HPI:  Oncology History  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[place])  05/28/2019 Initial Diagnosis   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HCC), Stage IA   06/19/2019 Cancer Staging   staging CT scan of the chest abdomen and pelvis.  This revealed that she was status post hysterectomy with no evidence of metastatic disease in the abdomen or pelvis or chest.   06/26/2019 Imaging   MRI of the pelvis with IV contrast: susceptibility artifact in the vaginal cuff from suture material limiting evaluation.  However no focal soft tissue masses were seen involving the vaginal cuff and no abnormal soft tissue densities in the parametrial regions.  The right and left ovaries were grossly normal.   07/22/2019 Surgery   Robotic assisted radical upper vaginectomy with bilateral pelvic sentinel lymph node biopsy, right salpingo-oophorectomy, left salpingectomy.  Intraoperative findings were significant for no grossly visible vaginal lesions.  The ovaries and fallopian tubes were adherent to the rectum and vaginal cuff in the obliterated cul-de-sac.  The ovaries themselves are grossly normal-appearing but densely adherent with no suspicious lymph nodes.  There is clinical stage I disease, microscopic.   07/22/2019 Cancer Staging   Postoperatively final pathology returned as FIGO stage Ia microscopic poorly differentiated squamous cell carcinoma.  The tumor site was the posterior vaginal wall and measured 0.6 cm.  The posterior margin (the posterior vaginal cuff margin) was broadly positive for tumor cells.  The deep margin was negative.  There was 2.5 mm depth of invasion.  All sentinel lymph nodes were negative for metastases, as was the left fallopian tube and right tube and ovary.   08/24/2019 - 09/22/2019 Radiation Therapy   Vaginal brachytherapy    09/15/2020 PET scan   Soft tissue attenuating structure is noted within the left pelvis measuring 1.7 cm. This was favored to be a left ovary. No clear evidence for recurrence.    01/01/2021 Imaging   Pelvic MRI 1. No evidence of local tumor recurrence at the vaginal resection margin. 2. No evidence of metastatic disease in the pelvis. 3. Left adnexal 2.3 x 1.6 x 1.6 cm structure is compatible with an atrophic left ovary, unchanged from 08/31/2020 outside CT. 4. Chronic mild diffuse bladder wall thickening, unchanged, potentially the sequela of prior radiation therapy     Interval History: She denies any vaginal bleeding/discharge, leg pain, cough or weight loss.  History of possible rUTIs, incontinence, dysuria.  Wears incontinence undergarments.  She has chronic vaginal/genital malodor.  At the last visit the odor was stronger.  A wet prep was negative.  Review of Systems  Review of Systems  Constitutional: Negative for weight loss.  Respiratory: Negative for cough.   Genitourinary: Positive for dysuria.       Negative for vaginal bleeding  Musculoskeletal:       Negative for leg pain    Current Meds:  Outpatient Encounter Medications as of 01/10/2021  Medication Sig  . acetaminophen (TYLENOL) 325 MG tablet Take 1-2 tablets (325-650 mg total) by mouth every 4 (four) hours as needed for mild pain. (Patient not taking: No sig reported)  . cephALEXin (KEFLEX) 500 MG capsule Take 500 mg by mouth 3 (three) times daily.  . Ferrous Sulfate (IRON) 325 (65 Fe) MG TABS Take 1 tablet by mouth every other day.  . gabapentin (NEURONTIN) 100 MG capsule Take 1 capsule (100 mg total) by mouth 3 (three)  times daily.  Marland Kitchen oxybutynin (DITROPAN-XL) 10 MG 24 hr tablet Take 10 mg by mouth daily.  . Oxycodone HCl 10 MG TABS Take 1-1.5 tablets (10-15 mg total) by mouth 4 (four) times daily as needed.  . solifenacin (VESICARE) 10 MG tablet Take 10 mg by mouth daily.  . Vitamin D, Ergocalciferol,  (DRISDOL) 1.25 MG (50000 UNIT) CAPS capsule Take 50,000 Units by mouth. Every 14 days   No facility-administered encounter medications on file as of 01/10/2021.    Allergy:  Allergies  Allergen Reactions  . Ciprofloxacin Swelling    Lips swell, tongue swells, face swells  . Citalopram Other (See Comments)    Possible cause of pancytopenia. Swelling of tongue, face and throat  . Lamotrigine Other (See Comments)    Possible cause of pancytopenia. Swelling of face, throat and tongue  . Sulfa Antibiotics Anaphylaxis  . Tramadol Anaphylaxis, Shortness Of Breath and Swelling    Social Hx:   Social History   Socioeconomic History  . Marital status: Soil scientist    Spouse name: Not on file  . Number of children: Not on file  . Years of education: Not on file  . Highest education level: Not on file  Occupational History  . Not on file  Tobacco Use  . Smoking status: Never Smoker  . Smokeless tobacco: Never Used  Vaping Use  . Vaping Use: Never used  Substance and Sexual Activity  . Alcohol use: Not Currently  . Drug use: No  . Sexual activity: Yes    Birth control/protection: Post-menopausal, Surgical    Comment: perimenopausal; no sex in years  Other Topics Concern  . Not on file  Social History Narrative  . Not on file   Social Determinants of Health   Financial Resource Strain: Not on file  Food Insecurity: Not on file  Transportation Needs: Not on file  Physical Activity: Not on file  Stress: Not on file  Social Connections: Not on file  Intimate Partner Violence: Not on file    Past Surgical Hx:  Past Surgical History:  Procedure Laterality Date  . ABDOMINAL HYSTERECTOMY    . CERVICAL CONIZATION W/BX N/A 07/01/2016   Procedure: CONIZATION CERVIX WITH BIOPSY;  Surgeon: Chancy Milroy, MD;  Location: [facility];  Service: Gynecology;  Laterality: N/A;  . CHOLECYSTECTOMY    . EXTERNAL FIXATION LEG Right 04/09/2020   Procedure: EXTERNAL FIXATION LEG;  Surgeon:  Hiram Gash, MD;  Location: [facility];  Service: Orthopedics;  Laterality: Right;  . HYSTEROSCOPY WITH D & C N/A 01/25/2016   Procedure: DILATATION AND CURETTAGE /HYSTEROSCOPY;  Surgeon: Mora Bellman, MD;  Location: [facility];  Service: Gynecology;  Laterality: N/A;  . LYMPH NODE BIOPSY Bilateral 07/22/2019   Procedure: LYMPH NODE BIOPSY;  Surgeon: Everitt Amber, MD;  Location: [facility];  Service: Gynecology;  Laterality: Bilateral;  . OPEN REDUCTION INTERNAL FIXATION (ORIF) TIBIA/FIBULA FRACTURE Right 04/10/2020   Procedure: OPEN REDUCTION INTERNAL FIXATION (ORIF) TIBIA/FIBULA FRACTURE;  Surgeon: Shona Needles, MD;  Location: [facility];  Service: Orthopedics;  Laterality: Right;  . ROBOT ASSISTED MYOMECTOMY N/A 07/22/2019   Procedure: XI ROBOTIC ASSISTED LAPAROSCOPIC RADICAL UPPER VAGINECTOMY, LEFT SALPINECTOMY, RIGHT SALPINGOOOPHERECTOMY;  Surgeon: Everitt Amber, MD;  Location: [facility];  Service: Gynecology;  Laterality: N/A;  . VAGINAL HYSTERECTOMY N/A 03/11/2017   Procedure: HYSTERECTOMY VAGINAL WITH MORCELLATION;  Surgeon: Chancy Milroy, MD;  Location: [facility];  Service: Gynecology;  Laterality: N/A;    Past Medical Hx:  Past Medical  History:  Diagnosis Date  . Alcohol abuse   . Anemia    patient denies  . Bipolar 1 disorder (HCC)    No medications currently  . CAP (community acquired pneumonia) 03/17/2015  . History of radiation therapy 08/24/19-09/22/19   Vaginal Radiation;Dr. Gery Pray  . 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 Mental disorder   . Obesity   . PICC line infection 05/17/2015  . Sepsis due to Gram negative bacteria (MDR E Coli) 02/18/2015  . 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[place])   . UTI (lower urinary tract infection)   . Vaginal Pap smear, abnormal     Family Hx:  Family History  Problem Relation Age of Onset  . Alcohol abuse Father   . Cancer Father   . Breast cancer Neg Hx     Vitals:  BP (!) 124/99 (BP Location: Left Arm, Patient Position:  Sitting)   Pulse 90   Temp (!) 96.8 F (36 C) (Tympanic)   Resp 18   Wt 255 lb (115.7 kg)   LMP  (LMP Unknown)   SpO2 99% Comment: RA  BMI 38.77 kg/m  Exam deferred   Assessment/Plan:  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HCC) H/O Stage IA SCC of vagina Normal exam.  Recent imaging negative for recurrence Chronic pain in the setting RT; history suggestive of a chronic bladder pain syndrome  >attempt to titrate gabapentin >referral-->PFPT >continue urinary analgesics >f/u w/Urology >keep next scheduled appointment w/[name]   I personally spent 25 minutes face-to-face and non-face-to-face in the care of this patient, which includes all pre, intra, and post visit time on the date of service.   Lahoma Crocker, MD 01/09/2021, 10:09 AM

## 2021-01-10 ENCOUNTER — Encounter: Payer: Self-pay | Admitting: Obstetrics & Gynecology

## 2021-01-10 ENCOUNTER — Inpatient Hospital Stay (HOSPITAL_BASED_OUTPATIENT_CLINIC_OR_DEPARTMENT_OTHER): Payer: Medicaid Other | Admitting: Obstetrics & Gynecology

## 2021-01-10 ENCOUNTER — Other Ambulatory Visit: Payer: Self-pay

## 2021-01-10 ENCOUNTER — Ambulatory Visit: Payer: Medicaid Other

## 2021-01-10 VITALS — BP 124/99 | HR 90 | Temp 96.8°F | Resp 18 | Wt 255.0 lb

## 2021-01-10 DIAGNOSIS — R102 Pelvic and perineal pain: Secondary | ICD-10-CM | POA: Diagnosis not present

## 2021-01-10 DIAGNOSIS — C52 Malignant neoplasm of vagina: Secondary | ICD-10-CM | POA: Diagnosis not present

## 2021-01-10 NOTE — Patient Instructions (Addendum)
Keep next scheduled appointment w/[name]. Referral being made for pelvic floor physical therapy.  Will try to titrate up Gabapentin dosing if cleared with your primary care provider.   Starting Gabapentin (Neurontin) Dose Titration Schedule  _100_ mg capsules   Dates   Morning  Afternoon  Bedtime   _Day 1______  1   1   2    _Day 2______  2   1   2    _Day 3______  2   2   2    _Day 4______  2   2   3    _Day 5______  3   2   3    _Day 6    ____  3   3   3    Note: If you start experiencing intolerable side effects such as severe dizziness or altered mental status, reduce your intake to the highest dose you tolerated, and stay at that dose until you see your physician.

## 2021-01-11 ENCOUNTER — Telehealth: Payer: Self-pay | Admitting: *Deleted

## 2021-01-11 ENCOUNTER — Telehealth: Payer: Self-pay

## 2021-01-11 ENCOUNTER — Encounter: Payer: Self-pay | Admitting: Radiation Oncology

## 2021-01-11 NOTE — Telephone Encounter (Signed)
Returned patient's 10+ calls to reschedule today's appointment.  [name] Counseling Intern

## 2021-01-11 NOTE — Telephone Encounter (Signed)
Phone call to patient regarding her C/O vaginal pain.  Informed patient a message has been sent to her PCP to see if her gabapentin can be increased to 300 mg three times a day and a referral has been sent for her to begin pelvic floor PT.  Patient instructed to call her PCP about the gabapentin prescription if she wants.  Patient verbalizes understanding.

## 2021-01-11 NOTE — Telephone Encounter (Signed)
Patient called back stating "I am still hurt in my vagina. Plus the people from the rehab place called and said I can't start with them until I finish with leg PT." Explained that the office will call the pelvic PT office and check on this for her and call her tomorrow. Patient then stated "Well in the mean time do I just sit here in pain. It hurts to sit, to walk, to ly down and to pee. What if I just went to the ER." Explained that if she is hurting that much it may be best to go to the ER.

## 2021-01-11 NOTE — Telephone Encounter (Signed)
Patient called and stated "I came in yesterday for the results on my scan. I am still having pain in my vagina. It is a sharp stabbing pain, I have pain with urinating and it hurts to sit. Nothing makes it better." Explained that the office will call her back later today

## 2021-01-11 NOTE — Telephone Encounter (Signed)
Patient was seen by Lahoma Crocker, MD with [facility] and her evaluation included the continued pain she is having in her vaginal/urethra area.   Patient called this morning stating that she is still having pain.  Will discuss with Dr Sondra Come prior to calling patient back.

## 2021-01-11 NOTE — Progress Notes (Signed)
Follow-up note: Telephone contact  Late this afternoon and return patient's call.  We discussed the findings of no signs of cancer on her recent MRI and physical examine and that we are trying to get her gabapentin dose increased as well as setting her up for physical therapy with pelvic floor rehab.  She is agreeable to these ideas and is hopeful for improvement in her pelvic pain.  -----------------------------------  Blair Promise, PhD, MD

## 2021-01-12 ENCOUNTER — Telehealth: Payer: Self-pay | Admitting: *Deleted

## 2021-01-12 NOTE — Telephone Encounter (Signed)
Called [facility] at [place] and left a message to call the office back.   Called the PCP office and they will discuss the medicine then.

## 2021-01-12 NOTE — Telephone Encounter (Signed)
Per patient request called both the pelvic PT office regarding her appt and her PCP regarding increasing her gabapentin, left messages

## 2021-01-16 ENCOUNTER — Encounter: Payer: Self-pay | Admitting: General Practice

## 2021-01-16 NOTE — Progress Notes (Signed)
[place] Spiritual Care Note  Received and returned phone call from [place]. She was feeling down and discouraged due to recurring vaginal pain, but trying to be hopeful about PT. Not feeling well is making ADLs more burdensome. Provided empathic listening and emotional support. Lexia plans to reach out again as needed.   [place], [place], [facility] Pager [phone] Voicemail [phone]

## 2021-01-17 ENCOUNTER — Telehealth: Payer: Self-pay

## 2021-01-17 ENCOUNTER — Ambulatory Visit: Payer: Medicaid Other

## 2021-01-17 ENCOUNTER — Other Ambulatory Visit: Payer: Self-pay

## 2021-01-17 DIAGNOSIS — Z9889 Other specified postprocedural states: Secondary | ICD-10-CM | POA: Diagnosis not present

## 2021-01-17 DIAGNOSIS — R262 Difficulty in walking, not elsewhere classified: Secondary | ICD-10-CM

## 2021-01-17 DIAGNOSIS — M25571 Pain in right ankle and joints of right foot: Secondary | ICD-10-CM

## 2021-01-17 DIAGNOSIS — Z8781 Personal history of (healed) traumatic fracture: Secondary | ICD-10-CM

## 2021-01-17 NOTE — Telephone Encounter (Signed)
Called patient to confirm session and check-in. Returned VM.  [name] Counseling Intern

## 2021-01-18 ENCOUNTER — Telehealth: Payer: Self-pay | Admitting: Oncology

## 2021-01-18 ENCOUNTER — Telehealth: Payer: Self-pay | Admitting: *Deleted

## 2021-01-18 NOTE — Progress Notes (Signed)
[place] Patient and [facility] Counseling Note  The counselor and patient discussed the patient's experience with doctors, her boyfriend's nursing home, her family, her faith. The patient expressed a desire to change specialists, but has trepidation as well. The patient also described a fear of her boyfriend dying and anger that he has been mistreated at the nursing home. The counselor provided empathic listening, reflections, and a safe space to process. The patient was oriented times three in session. Her mood was upset with a labile affect. No evidence of SI/HI/NSSI. Patient does not understand the ways that people communicate with her, so she has thoughts like "They think I'm stupid," "They are mistreating me," or "I am stupid." These thoughts bring up anger, hurt, sadness and more. These feelings result in the patient lashing out, further distancing herself from others.The anger she experiences appears to act as a shield to keep herself from being vulnerable. If patient can increase positive interactions with others, these feelings may decrease. Next session will discuss current stressors and the possibility of getting a new specialist.    [name] Counseling Intern

## 2021-01-18 NOTE — Telephone Encounter (Signed)
Krystie called and is feeling upset by how she is treated by her urinary specialist.  Advised her that she can change doctors if it is really bothering her.  She verbalized agreement and will think about it.

## 2021-01-18 NOTE — Therapy (Signed)
[place], [place], [address] Phone: [phone]   Fax:  [phone]  Physical Therapy Treatment/Discharge  Patient Details  Name: Nachelle Negrette MRN: [id number] Date of Birth: 09-16-1966 Referring Provider (PT): Delray Alt, PA-C   Encounter Date: 01/17/2021   PT End of Session - 01/17/21 1442    Visit Number 17    Number of Visits 19    Date for PT Re-Evaluation 01/13/21    Authorization Type MCD    Authorization Time Period 01/17/2021 - 02/13/2021    Authorization - Visit Number 1    Authorization - Number of Visits 4    PT Start Time 3762    PT Stop Time 1528    PT Time Calculation (min) 43 min    Activity Tolerance Patient tolerated treatment well;Patient limited by fatigue    Behavior During Therapy [facility] for tasks assessed/performed           Past Medical History:  Diagnosis Date  . Alcohol abuse   . Anemia    patient denies  . Bipolar 1 disorder (HCC)    No medications currently  . CAP (community acquired pneumonia) 03/17/2015  . History of radiation therapy 08/24/19-09/22/19   Vaginal Radiation;Dr. Gery Pray  . 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 Mental disorder   . Obesity   . PICC line infection 05/17/2015  . Sepsis due to Gram negative bacteria (MDR E Coli) 02/18/2015  . 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 UTI (lower urinary tract infection)   . Vaginal Pap smear, abnormal     Past Surgical History:  Procedure Laterality Date  . ABDOMINAL HYSTERECTOMY    . CERVICAL CONIZATION W/BX N/A 07/01/2016   Procedure: CONIZATION CERVIX WITH BIOPSY;  Surgeon: Chancy Milroy, MD;  Location: [facility];  Service: Gynecology;  Laterality: N/A;  . CHOLECYSTECTOMY    . EXTERNAL FIXATION LEG Right 04/09/2020   Procedure: EXTERNAL FIXATION LEG;  Surgeon: Hiram Gash, MD;  Location: [facility];  Service: Orthopedics;  Laterality: Right;  . HYSTEROSCOPY WITH D & C N/A  01/25/2016   Procedure: DILATATION AND CURETTAGE /HYSTEROSCOPY;  Surgeon: Mora Bellman, MD;  Location: [facility];  Service: Gynecology;  Laterality: N/A;  . LYMPH NODE BIOPSY Bilateral 07/22/2019   Procedure: LYMPH NODE BIOPSY;  Surgeon: Everitt Amber, MD;  Location: [facility];  Service: Gynecology;  Laterality: Bilateral;  . OPEN REDUCTION INTERNAL FIXATION (ORIF) TIBIA/FIBULA FRACTURE Right 04/10/2020   Procedure: OPEN REDUCTION INTERNAL FIXATION (ORIF) TIBIA/FIBULA FRACTURE;  Surgeon: Shona Needles, MD;  Location: [facility];  Service: Orthopedics;  Laterality: Right;  . ROBOT ASSISTED MYOMECTOMY N/A 07/22/2019   Procedure: XI ROBOTIC ASSISTED LAPAROSCOPIC RADICAL UPPER VAGINECTOMY, LEFT SALPINECTOMY, RIGHT SALPINGOOOPHERECTOMY;  Surgeon: Everitt Amber, MD;  Location: [facility];  Service: Gynecology;  Laterality: N/A;  . VAGINAL HYSTERECTOMY N/A 03/11/2017   Procedure: HYSTERECTOMY VAGINAL WITH MORCELLATION;  Surgeon: Chancy Milroy, MD;  Location: [facility];  Service: Gynecology;  Laterality: N/A;    There were no vitals filed for this visit.   Subjective Assessment - 01/17/21 1442    Subjective "Sometimes the pain surprises me because it doesn't happen. I have been having pain in my vagina. I went to see the cancer doctor and they want to send me to a specialist to do therapy for my pain there."    How long can you stand comfortably? 5 minutes    How long can you  walk comfortably? 10 minutes    Patient Stated Goals decrease pain, get out boot, walk without AD (PLOF)    Currently in Pain? No/denies   "no not yet"   Pain Score 0-No pain    Pain Onset More than a month ago              [facility] PT Assessment - 01/17/21 0001      Assessment   Medical Diagnosis s/p Rt tib-fib ORIF    Referring Provider (PT) Delray Alt, PA-C    Onset Date/Surgical Date 04/10/20      Observation/Other Assessments   Focus on Therapeutic Outcomes (FOTO)  No FOTO - MCD      AROM   Right/Left Ankle Right    Right  Ankle Dorsiflexion 15    Right Ankle Plantar Flexion 60    Right Ankle Inversion 35    Right Ankle Eversion 18      Strength   Right Ankle Dorsiflexion 5/5    Right Ankle Plantar Flexion 5/5    Right Ankle Inversion 4+/5    Right Ankle Eversion 4/5                         [facility] Adult PT Treatment/Exercise - 01/17/21 0001      Ambulation/Gait   Ambulation/Gait Yes    Ambulation/Gait Assistance 5: Supervision    Ambulation Distance (Feet) --   60 feet x 3 (attempted lap around therapy gym with 3 seated rest breaks due to fatigue)   Assistive device Rollator    Gait Pattern Step-through pattern    Ambulation Surface Level;Indoor      Self-Care   Self-Care Other Self-Care Comments    Other Self-Care Comments  See patient education                  PT Education - 01/18/21 [id number]    Education Details Thoroughly reviewed final HEP, objective findings, discussed POC and discharge    Person(s) Educated Patient    Methods Explanation    Comprehension Verbalized understanding;Need further instruction            PT Short Term Goals - 10/09/20 1916      PT SHORT TERM GOAL #1   Title ankle DF to 5 deg    Baseline 10 deg 09/06/2020    Status Achieved      PT SHORT TERM GOAL #2   Title pt will demo full weight acceptance into Rt foot in static stance    Baseline able to demo full weight acceptance    Status Achieved             PT Long Term Goals - 01/17/21 1453      PT LONG TERM GOAL #1   Title Patient will be able to ambulate >/= 500 ft safely using LRAD    Baseline Patient using rollator full-time and fatigues with short distances    Time 4   POC 6 weeks, incr time to accommodate MCD auth   Period Weeks    Status On-going      PT LONG TERM GOAL #2   Title Patient will report pain with ADLs <=2/10    Baseline "10/10 sometimes but the pain is different everyday"    Time 4    Period Weeks    Status On-going      PT LONG TERM GOAL #3   Title  pt will ambulate in the community using LRAD  Baseline Patient using rollator full-time, continues to be limited with community ambulation    Time 4    Period Weeks    Status On-going      PT LONG TERM GOAL #4   Title pt will demo SLS comfortably for at least 5s without UE assist to demo full weight acceptance as well as balance control for safety    Baseline Unable to perform SLS - heavy reliance on BUE for support for balance    Time 4    Period Weeks    Status On-going      PT LONG TERM GOAL #5   Title Patient will be able to negotiate 4 standard stairs using single railing to improve ability to enter and leave home safely    Baseline Patient able to negotiate small and large stairs using bilateral railings - heavy reliance on railings for support    Time 4    Period Weeks    Status On-going                 Plan - 01/17/21 1443    Clinical Impression Statement Patient returns to PT for the first time since 12/19/2020 as she has had appointments for several other issues during that time, including increased vaginal pain and discomfort per patient. She explains that she plans to begin pelvic health therapy after completing physical therapy POC. Patient continues to demonstrate RLE MMT and R ankle AROM WFL without pain upon arrival but explains that pain fluctuates daily, often remaining at 10/10. She demonstrates improvement in strength and tolerance to interventions overall but continues to have limitations with functional tasks, such as prolonged walking, stairs, and floor transfers. Pt attributes increased fatigue today due to not eating a full meal with medication prior to attending PT. Patient explains she has not attempted stairs at home due to colder weather. She fatigues quickly during gait training, requiring prolonged seated rest and water breaks after ~60 feet. Discussed discharge from formal PT at this time and advised patient to continue performing HEP independently at  home for strengthening due to progress over the past several sessions being limited in part secondary to several other factors and issues patient is managing.    Personal Factors and Comorbidities Fitness;Time since onset of injury/illness/exacerbation;Comorbidity 1;Education    Comorbidities mental disorder    Examination-Activity Limitations Sit;Squat;Stairs;Stand;Transfers;Locomotion Level    PT Frequency 1x / week    PT Duration 4 weeks    PT Treatment/Interventions ADLs/Self Care Home Management;Cryotherapy;Electrical Stimulation;Gait training;Neuromuscular re-education;Therapeutic exercise;Therapeutic activities;Functional mobility training;Stair training;Patient/family education;Manual techniques;Taping;Passive range of motion;Scar mobilization;Vasopneumatic Device    PT Next Visit Plan Floor transfers, lunging, squatting, sit<>stand    PT Home Exercise Plan [id number]    Consulted and Agree with Plan of Care Patient           Patient will benefit from skilled therapeutic intervention in order to improve the following deficits and impairments:  Abnormal gait,Decreased range of motion,Difficulty walking,Decreased activity tolerance,Pain,Improper body mechanics,Impaired flexibility,Decreased strength  Visit Diagnosis: Status post open reduction with internal fixation (ORIF) of fracture of ankle  Pain in right ankle and joints of right foot  Difficulty in walking, not elsewhere classified     Problem List Patient Active Problem List   Diagnosis Date Noted  . Acute blood loss anemia   . Leukopenia   . Vitamin D deficiency   . Acute lower UTI   . Postoperative pain   . Tibia/fibula fracture, right, sequela 04/15/2020  . Closed fracture  of medial malleolus of right ankle 04/10/2020  . Open fracture of distal end of fibula and tibia, right, type I or II, initial encounter 04/09/2020  . Vaginal cancer ([place]) 07/22/2019  . Morbid obesity with BMI of 40.0-44.9, adult ([place]) 06/11/2019   . 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 Visit for routine gyn exam 04/21/2019  . Vaginal atrophy 04/21/2019  . Painful lumpy right breast 07/27/2016  . Urinary tract infection 07/01/2015  . Diastolic dysfunction 70/72/1711  . Constipation 05/12/2015  . ESBL (extended spectrum beta-lactamase) producing bacteria infection 03/17/2015  . Diarrhea 03/03/2015  . Weakness 03/02/2015  . 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 Anxiety state 02/20/2015  . Claustrophobia 02/20/2015  . Transaminitis 02/18/2015  . Chest pain 02/18/2015  . Abdominal pain   . Dizziness   . Hypotension 01/04/2015  . Bipolar disorder ([place]) 06/29/2012   PHYSICAL THERAPY DISCHARGE SUMMARY  Visits from Start of Care: 17  Current functional level related to goals / functional outcomes: See above   Remaining deficits: See above   Education / Equipment: See above  Plan: Patient agrees to discharge.  Patient goals were partially met. Patient is being discharged due to lack of progress.  ?????    Patient has progressed overall throughout POC but has had limitations with little progress recently partially attributed to several psychosocial factors (anxiety/fear with attempted interventions resulting in participation limitation and focus shifting to deep breathing and taking time to calm down rather than being able to perform exercises during PT sessions) and other health conditions patient is currently managing.    Haydee Monica, PT, DPT 01/18/21 12:55 AM  [facility] [address] [place], [place], [address] Phone: [phone]   Fax:  [phone]  Name: Sora Vrooman MRN: [id number] Date of Birth: 09-May-1966

## 2021-01-18 NOTE — Telephone Encounter (Signed)
Patient called and stated "I want to change the UTI doctor. The one in  I don't like him. He treats me like I'm stupid, and he tells me he doesn't understand me." Explained that the message will be given to [facility] APP and Dr Denman George; and the office will call her back.

## 2021-01-19 ENCOUNTER — Telehealth: Payer: Self-pay | Admitting: *Deleted

## 2021-01-19 NOTE — Telephone Encounter (Signed)
Patient called the office and stated "I called yesterday about coming in there to talk with Dr Denman George about changing UTI doctors. This doctor now has my nerves torn up. He says her doesn't understand me and makes me feel stupid. I'm not stupid. He says he took everything out at my surgery. I am scared he took out my ovaries." Explained the the message will be given to Dr Denman George and the office will call her back soon

## 2021-01-19 NOTE — Telephone Encounter (Signed)
Told Jordan [name] that [name] said that the referral to [facility] needs to come from PCP. She can arrange the appointment. The urology issue is not related to her history of vaginal cancer.  Pt. Verbalized understanding.

## 2021-01-22 ENCOUNTER — Encounter: Payer: Self-pay | Admitting: General Practice

## 2021-01-22 NOTE — Progress Notes (Signed)
[place] Spiritual Care Note  Received and returned phone call from Muriel, providing opportunity for her to share and process distress related to inability to pay a bill. She is attempting to pursue all possible resources, including [employer], [employer], and [employer]. She plans to phone again as needed.   [place], [place], [facility] Pager [phone] Voicemail [phone]

## 2021-01-23 ENCOUNTER — Telehealth: Payer: Self-pay | Admitting: *Deleted

## 2021-01-23 NOTE — Telephone Encounter (Signed)
Patient called and left a message stating "I'm having pain in my vagina. On a pain level of 0-10, the pain is over a 10. The pain is so excruciating and I can't take it anymore." Patient was crying on the phone. Message given to the desk RN

## 2021-01-23 NOTE — Telephone Encounter (Signed)
Spoke with Jordan [name]. She has been taking gabapentin 300 mg tid since 01-12-21 visit with [name], NP on 01-12-21. She has an appointment today with [name] today. She needs to keep appointment to discuss pain management .  If pain continues to be 10 or greater, She needs to go to the ED to be evaluated. Pt verbalized understanding.

## 2021-01-24 ENCOUNTER — Encounter: Payer: Self-pay | Admitting: General Practice

## 2021-01-24 ENCOUNTER — Telehealth: Payer: Self-pay

## 2021-01-24 ENCOUNTER — Ambulatory Visit: Payer: Medicaid Other

## 2021-01-24 ENCOUNTER — Encounter (HOSPITAL_COMMUNITY): Payer: Self-pay

## 2021-01-24 ENCOUNTER — Emergency Department (HOSPITAL_COMMUNITY)
Admission: EM | Admit: 2021-01-24 | Discharge: 2021-01-24 | Disposition: A | Discharge status: Home / Self Care | Payer: Medicaid Other | Attending: Emergency Medicine | Admitting: Emergency Medicine

## 2021-01-24 DIAGNOSIS — Z8544 Personal history of malignant neoplasm of other female genital organs: Secondary | ICD-10-CM | POA: Diagnosis not present

## 2021-01-24 DIAGNOSIS — N390 Urinary tract infection, site not specified: Secondary | ICD-10-CM | POA: Diagnosis not present

## 2021-01-24 DIAGNOSIS — G8929 Other chronic pain: Secondary | ICD-10-CM | POA: Insufficient documentation

## 2021-01-24 DIAGNOSIS — B9689 Other specified bacterial agents as the cause of diseases classified elsewhere: Secondary | ICD-10-CM | POA: Insufficient documentation

## 2021-01-24 DIAGNOSIS — R42 Dizziness and giddiness: Secondary | ICD-10-CM | POA: Insufficient documentation

## 2021-01-24 DIAGNOSIS — R102 Pelvic and perineal pain: Secondary | ICD-10-CM | POA: Diagnosis present

## 2021-01-24 LAB — CBC WITH DIFFERENTIAL/PLATELET
Abs Immature Granulocytes: 0.01 10*3/uL (ref 0.00–0.07)
Basophils Absolute: 0 10*3/uL (ref 0.0–0.1)
Basophils Relative: 0 %
Eosinophils Absolute: 0 10*3/uL (ref 0.0–0.5)
Eosinophils Relative: 0 %
HCT: 44.4 % (ref 36.0–46.0)
Hemoglobin: 15.4 g/dL — ABNORMAL HIGH (ref 12.0–15.0)
Immature Granulocytes: 0 %
Lymphocytes Relative: 27 %
Lymphs Abs: 0.9 10*3/uL (ref 0.7–4.0)
MCH: 33.4 pg (ref 26.0–34.0)
MCHC: 34.7 g/dL (ref 30.0–36.0)
MCV: 96.3 fL (ref 80.0–100.0)
Monocytes Absolute: 0.2 10*3/uL (ref 0.1–1.0)
Monocytes Relative: 6 %
Neutro Abs: 2.3 10*3/uL (ref 1.7–7.7)
Neutrophils Relative %: 67 %
Platelets: 150 10*3/uL (ref 150–400)
RBC: 4.61 MIL/uL (ref 3.87–5.11)
RDW: 14.8 % (ref 11.5–15.5)
WBC: 3.4 10*3/uL — ABNORMAL LOW (ref 4.0–10.5)
nRBC: 0 % (ref 0.0–0.2)

## 2021-01-24 LAB — URINALYSIS, ROUTINE W REFLEX MICROSCOPIC
Bilirubin Urine: NEGATIVE
Glucose, UA: NEGATIVE mg/dL
Ketones, ur: NEGATIVE mg/dL
Nitrite: NEGATIVE
Protein, ur: 100 mg/dL — AB
RBC / HPF: >50 RBC/hpf — ABNORMAL HIGH (ref 0–5)
Specific Gravity, Urine: 1.024 (ref 1.005–1.030)
WBC, UA: >50 WBC/hpf — ABNORMAL HIGH (ref 0–5)
pH: 6 (ref 5.0–8.0)

## 2021-01-24 LAB — BASIC METABOLIC PANEL
Anion gap: 8 (ref 5–15)
BUN: 8 mg/dL (ref 6–20)
CO2: 29 mmol/L (ref 22–32)
Calcium: 9.7 mg/dL (ref 8.9–10.3)
Chloride: 108 mmol/L (ref 98–111)
Creatinine, Ser: 0.78 mg/dL (ref 0.44–1.00)
GFR, Estimated: >60 mL/min (ref >60)
Glucose, Bld: 93 mg/dL (ref 70–99)
Potassium: 3.9 mmol/L (ref 3.5–5.1)
Sodium: 145 mmol/L (ref 135–145)

## 2021-01-24 LAB — WET PREP, GENITAL
Clue Cells Wet Prep HPF POC: NONE SEEN
Sperm: NONE SEEN
Trich, Wet Prep: NONE SEEN
Yeast Wet Prep HPF POC: NONE SEEN

## 2021-01-24 MED ORDER — OXYCODONE-ACETAMINOPHEN 5-325 MG PO TABS
1.0000 | ORAL_TABLET | Freq: Once | ORAL | Status: AC
Start: 2021-01-24 — End: 2021-01-24
  Administered 2021-01-24: 1 via ORAL
  Filled 2021-01-24: qty 1

## 2021-01-24 MED ORDER — SODIUM CHLORIDE 0.9 % IV BOLUS
500.0000 mL | Freq: Once | INTRAVENOUS | Status: AC
  Administered 2021-01-24: 500 mL via INTRAVENOUS

## 2021-01-24 MED ORDER — ONDANSETRON 4 MG PO TBDP
4.0000 mg | ORAL_TABLET | Freq: Once | ORAL | Status: AC
  Administered 2021-01-24: 4 mg via ORAL
  Filled 2021-01-24: qty 1

## 2021-01-24 MED ORDER — CEPHALEXIN 500 MG PO CAPS
500.0000 mg | ORAL_CAPSULE | Freq: Two times a day (BID) | ORAL | 0 refills | Status: DC
Start: 2021-01-24 — End: 2021-03-22

## 2021-01-24 NOTE — ED Triage Notes (Signed)
Pt BIB [facility] from home for vaginal pain x2 months. Noticed vaginal bleeding today. Pt states she 'has vaginal cancer from a sexually transmitted disease.' Seen dr multiple times. Also c/o dysuria and urine odor.  CBG 116 BP 155/88 HR 77 SpO2 96% RA Temp 97.1

## 2021-01-24 NOTE — Telephone Encounter (Signed)
Called to confirm appointment tomorrow.  [name] Counseling Intern

## 2021-01-24 NOTE — Telephone Encounter (Signed)
Returned patient VM, left VM and confirmed time of appointment and said it was okay if she needed to cancel.  [name] Counseling Intern

## 2021-01-24 NOTE — Progress Notes (Signed)
[place] Spiritual Care Note  Jordan [name] self-referred via [facility]/Counseling Intern. Met with Jordan [name] in [place] to provide support related to her emotional distress at ongoing vaginal pain and intense burning with urination. Jordan [name] verbalized appreciation for pastoral presence and used opportunity well to share and process her feelings and to receive support and encouragement. The whole [facility] team is very dear to her and helps create a sense of safety and respect for her. We plan to follow up by phone tomorrow.   [place], [place], [facility] Pager [phone] Voicemail [phone]

## 2021-01-24 NOTE — ED Provider Notes (Signed)
[facility] DEPT Provider Note   CSN: [id number] Arrival date & time: 01/24/21  1215     History Chief Complaint  Patient presents with  . Vaginal Bleeding  . Dysuria    Chaelyn Bunyan is a 55 y.o. female.  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  She was stage I when it was diagnosed in 2020.  She receives radiation therapy and has not had any recent evidence of recurrence.  She has had some chronic vaginal pain which she says is been worse over the last 2 months.  She describes as burning pain.  She is also said that she has a urinary tract infections been going on for 16 years.  She has burning when she urinates but says is not really different than it always has.  She notes a odor from her vaginal area which cream to record review appears to be a chronic finding although she says it is worse than it normally is.  She said that when she wiped this morning she noticed some blood.  She denies any associate abdominal pain.  She does have some dizziness which she describes as lightheadedness although this time she feels like the room is spinning.  Its not really related to position.  She said this both lying down and standing up.  She has no associated headache.  This is been going on for about 5 or 6 days.  When it started she had 2 episodes of vomiting but has not had any since that time.  No associate abdominal pain.  No fevers.  She is followed by urologist and has had a gyn/onc doctor which she says she is not currently seeing.  She has chronic pain management with oxycodone and gabapentin which is prescribed by her PCP at [facility].  She says that she does not like her current urologist and is currently in the process of getting a referral to a new one.         Past Medical History:   Diagnosis Date  . Alcohol abuse   . Anemia    patient denies  . Bipolar 1 disorder (HCC)    No medications currently  . CAP (community acquired pneumonia) 03/17/2015  . History of radiation therapy 08/24/19-09/22/19   Vaginal Radiation;Dr. Gery Pray  . 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 Mental disorder   . Obesity   . PICC line infection 05/17/2015  . Sepsis due to Gram negative bacteria (MDR E Coli) 02/18/2015  . 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[place])   . UTI (lower urinary tract infection)   . Vaginal Pap smear, abnormal     Patient Active Problem List   Diagnosis Date Noted  . Acute blood loss anemia   . Leukopenia   . Vitamin D deficiency   . Acute lower UTI   . Postoperative pain   . Tibia/fibula fracture, right, sequela 04/15/2020  . Closed fracture of medial malleolus of right ankle 04/10/2020  . Open fracture of distal end of fibula and tibia, right, type I or II, initial encounter 04/09/2020  . Vaginal cancer ([place]) 07/22/2019  . Morbid obesity with BMI of 40.0-44.9, adult ([place]) 06/11/2019  . 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[place]) 05/28/2019  . Visit for routine gyn exam 04/21/2019  . Vaginal atrophy 04/21/2019  . Painful lumpy right breast  07/27/2016  . Urinary tract infection 07/01/2015  . Diastolic dysfunction 30/86/5784  . Constipation 05/12/2015  . ESBL (extended spectrum beta-lactamase) producing bacteria infection 03/17/2015  . Diarrhea 03/03/2015  . Weakness 03/02/2015  . 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 Anxiety state 02/20/2015  . Claustrophobia 02/20/2015  . Transaminitis 02/18/2015  . Chest pain 02/18/2015  . Abdominal pain   . Dizziness   . Hypotension 01/04/2015  . Bipolar disorder ([place]) 06/29/2012    Past Surgical History:  Procedure Laterality Date  . ABDOMINAL HYSTERECTOMY    . CERVICAL CONIZATION W/BX N/A 07/01/2016   Procedure: CONIZATION CERVIX WITH BIOPSY;  Surgeon: Chancy Milroy, MD;  Location: [facility];   Service: Gynecology;  Laterality: N/A;  . CHOLECYSTECTOMY    . EXTERNAL FIXATION LEG Right 04/09/2020   Procedure: EXTERNAL FIXATION LEG;  Surgeon: Hiram Gash, MD;  Location: [facility];  Service: Orthopedics;  Laterality: Right;  . HYSTEROSCOPY WITH D & C N/A 01/25/2016   Procedure: DILATATION AND CURETTAGE /HYSTEROSCOPY;  Surgeon: Mora Bellman, MD;  Location: [facility];  Service: Gynecology;  Laterality: N/A;  . LYMPH NODE BIOPSY Bilateral 07/22/2019   Procedure: LYMPH NODE BIOPSY;  Surgeon: Everitt Amber, MD;  Location: [facility];  Service: Gynecology;  Laterality: Bilateral;  . OPEN REDUCTION INTERNAL FIXATION (ORIF) TIBIA/FIBULA FRACTURE Right 04/10/2020   Procedure: OPEN REDUCTION INTERNAL FIXATION (ORIF) TIBIA/FIBULA FRACTURE;  Surgeon: Shona Needles, MD;  Location: [facility];  Service: Orthopedics;  Laterality: Right;  . ROBOT ASSISTED MYOMECTOMY N/A 07/22/2019   Procedure: XI ROBOTIC ASSISTED LAPAROSCOPIC RADICAL UPPER VAGINECTOMY, LEFT SALPINECTOMY, RIGHT SALPINGOOOPHERECTOMY;  Surgeon: Everitt Amber, MD;  Location: [facility];  Service: Gynecology;  Laterality: N/A;  . VAGINAL HYSTERECTOMY N/A 03/11/2017   Procedure: HYSTERECTOMY VAGINAL WITH MORCELLATION;  Surgeon: Chancy Milroy, MD;  Location: [facility];  Service: Gynecology;  Laterality: N/A;     OB History    Gravida  1   Para  1   Term  1   Preterm      AB      Living  1     SAB      IAB      Ectopic      Multiple      Live Births  1           Family History  Problem Relation Age of Onset  . Alcohol abuse Father   . Cancer Father   . Breast cancer Neg Hx     Social History   Tobacco Use  . Smoking status: Never Smoker  . Smokeless tobacco: Never Used  Vaping Use  . Vaping Use: Never used  Substance Use Topics  . Alcohol use: Not Currently  . Drug use: No    Home Medications Prior to Admission medications   Medication Sig Start Date End Date Taking? Authorizing Provider  cephALEXin (KEFLEX) 500 MG capsule  Take 1 capsule (500 mg total) by mouth 2 (two) times daily. 01/24/21  Yes Malvin Johns, MD  acetaminophen (TYLENOL) 325 MG tablet Take 1-2 tablets (325-650 mg total) by mouth every 4 (four) hours as needed for mild pain. Patient not taking: No sig reported 05/04/20   Love, Ivan Anchors, PA-C  Baclofen 5 MG TABS Take 1 tablet by mouth 3 (three) times daily as needed. 01/03/21   [provider]  cromolyn (OPTICROM) 4 % ophthalmic solution 1 drop 2 (two) times daily. Patient not taking: Reported on 01/09/2021 12/29/20   [provider]  desvenlafaxine (  PRISTIQ) 50 MG 24 hr tablet Take 50 mg by mouth daily. 12/28/20   [provider]  Ferrous Sulfate (IRON) 325 (65 Fe) MG TABS Take 1 tablet by mouth every other day. 02/03/20   [provider]  fluconazole (DIFLUCAN) 150 MG tablet Take 150 mg by mouth every other day. 12/31/20   [provider]  gabapentin (NEURONTIN) 300 MG capsule Take 1 capsule by mouth 3 (three) times daily. 01/12/21   [provider]  neomycin-polymyxin b-dexamethasone (MAXITROL) 3.5-10000-0.1 OINT SMARTSIG:1 Inch(es) In Eye(s) Every Night Patient not taking: Reported on 01/09/2021 12/29/20   [provider]  nitrofurantoin, macrocrystal-monohydrate, (MACROBID) 100 MG capsule Take 100 mg by mouth 2 (two) times daily. 12/31/20   [provider]  oxybutynin (DITROPAN-XL) 10 MG 24 hr tablet Take 10 mg by mouth daily. 03/31/20   [provider]  Oxycodone HCl 10 MG TABS Take 1-1.5 tablets (10-15 mg total) by mouth 4 (four) times daily as needed. 05/04/20   Love, Ivan Anchors, PA-C  phenazopyridine (PYRIDIUM) 200 MG tablet TAKE 1 TABLET BY MOUTH THREE TIMES DAILY AS NEEDED FOR BLADDER PAIN 12/31/20   [provider]  prednisoLONE acetate (PRED FORTE) 1 % ophthalmic suspension 1 drop 2 (two) times daily. Patient not taking: Reported on 01/09/2021 12/29/20   [provider]  RESTASIS 0.05 % ophthalmic emulsion 1  drop 2 (two) times daily. Patient not taking: Reported on 01/09/2021 12/29/20   [provider]  Vitamin D, Ergocalciferol, (DRISDOL) 1.25 MG (50000 UNIT) CAPS capsule Take 50,000 Units by mouth. Every 14 days 11/13/20   [provider]    Allergies    Ciprofloxacin, Citalopram, Lamotrigine, Sulfa antibiotics, and Tramadol  Review of Systems   Review of Systems  Constitutional: Negative for chills, diaphoresis, fatigue and fever.  HENT: Negative for congestion, rhinorrhea and sneezing.   Eyes: Negative.   Respiratory: Negative for cough, chest tightness and shortness of breath.   Cardiovascular: Negative for chest pain and leg swelling.  Gastrointestinal: Positive for nausea and vomiting (Last episode 5 days ago). Negative for abdominal pain, blood in stool and diarrhea.  Genitourinary: Positive for dysuria, pelvic pain, vaginal bleeding, vaginal discharge and vaginal pain. Negative for difficulty urinating, flank pain, frequency and hematuria.  Musculoskeletal: Negative for arthralgias and back pain.  Skin: Negative for rash.  Neurological: Positive for dizziness and light-headedness. Negative for speech difficulty, weakness, numbness and headaches.    Physical Exam Updated Vital Signs BP (!) 121/101   Pulse 83   Temp 97.8 F (36.6 C) (Oral)   Resp (!) 23   Ht 5\' 8"  (1.727 m)   Wt 115.6 kg   LMP  (LMP Unknown)   SpO2 100%   BMI 38.75 kg/m   Physical Exam Constitutional:      Appearance: She is well-developed.  HENT:     Head: Normocephalic and atraumatic.  Eyes:     Pupils: Pupils are equal, round, and reactive to light.  Cardiovascular:     Rate and Rhythm: Normal rate and regular rhythm.     Heart sounds: Normal heart sounds.  Pulmonary:     Effort: Pulmonary effort is normal. No respiratory distress.     Breath sounds: Normal breath sounds. No wheezing or rales.  Chest:     Chest wall: No tenderness.  Abdominal:     General: Bowel sounds are  normal.     Palpations: Abdomen is soft.     Tenderness: There is no abdominal tenderness. There  is no guarding or rebound.  Genitourinary:    Comments: Scarring and contraction of the vaginal wall.  No obvious discharge or bleeding is noted. Musculoskeletal:        General: Normal range of motion.     Cervical back: Normal range of motion and neck supple.  Lymphadenopathy:     Cervical: No cervical adenopathy.  Skin:    General: Skin is warm and dry.     Findings: No rash.  Neurological:     Mental Status: She is alert and oriented to person, place, and time.     ED Results / Procedures / Treatments   Labs (all labs ordered are listed, but only abnormal results are displayed) Labs Reviewed  WET PREP, GENITAL - Abnormal; Notable for the following components:      Result Value   WBC, Wet Prep HPF POC FEW (*)    All other components within normal limits  CBC WITH DIFFERENTIAL/PLATELET - Abnormal; Notable for the following components:   WBC 3.4 (*)    Hemoglobin 15.4 (*)    All other components within normal limits  URINALYSIS, ROUTINE W REFLEX MICROSCOPIC - Abnormal; Notable for the following components:   Color, Urine AMBER (*)    APPearance CLOUDY (*)    Hgb urine dipstick LARGE (*)    Protein, ur 100 (*)    Leukocytes,Ua LARGE (*)    RBC / HPF >50 (*)    WBC, UA >50 (*)    Bacteria, UA RARE (*)    All other components within normal limits  URINE CULTURE  BASIC METABOLIC PANEL  GC/CHLAMYDIA PROBE AMP ([place]) NOT AT [facility]    EKG EKG Interpretation  Date/Time:  Wednesday January 24 2021 14:22:29 EDT Ventricular Rate:  74 PR Interval:  212 QRS Duration: 66 QT Interval:  387 QTC Calculation: 430 R Axis:   57 Text Interpretation: Sinus rhythm Prolonged PR interval Abnormal R-wave progression, early transition since last tracing no significant change Confirmed by Malvin Johns [phone]) on 01/24/2021 2:30:32 PM   Radiology No results  found.  Procedures Procedures   Medications Ordered in ED Medications  sodium chloride 0.9 % bolus 500 mL (0 mLs Intravenous Stopped 01/24/21 1648)  ondansetron (ZOFRAN-ODT) disintegrating tablet 4 mg (4 mg Oral Given 01/24/21 1407)  oxyCODONE-acetaminophen (PERCOCET/ROXICET) 5-325 MG per tablet 1 tablet (1 tablet Oral Given 01/24/21 1722)    ED Course  I have reviewed the triage vital signs and the nursing notes.  Pertinent labs & imaging results that were available during my care of the patient were reviewed by me and considered in my medical decision making (see chart for details).    MDM Rules/Calculators/A&P                         Patient is a 55 year old female who presents with vaginal burning and urinary symptoms.  It seems that these are mostly chronic complaints.  She is followed by both her gynecologic doctor and urologist.  I did do a pelvic exam which is unremarkable.  Her urine does show some suggestions of infection.  She was started on antibiotics.  She did have some dizziness which was nonspecific.  She had no nystagmus.  No neurologic deficits.  No headache.  Her labs are nonconcerning.  She was discharged home in good condition.  She was encouraged to follow-up with her GYN oncologist Dr. And or urologist.  Return precautions were given. Final Clinical Impression(s) / ED  Diagnoses Final diagnoses:  Lower urinary tract infectious disease    Rx / DC Orders ED Discharge Orders         Ordered    cephALEXin (KEFLEX) 500 MG capsule  2 times daily        01/24/21 1708           Malvin Johns, MD 01/24/21 2315

## 2021-01-25 ENCOUNTER — Telehealth: Payer: Self-pay

## 2021-01-25 ENCOUNTER — Other Ambulatory Visit: Payer: Self-pay

## 2021-01-25 LAB — URINE CULTURE: Culture: NO GROWTH

## 2021-01-25 LAB — GC/CHLAMYDIA PROBE AMP (~~LOC~~) NOT AT ARMC
Chlamydia: NEGATIVE
Comment: NEGATIVE
Comment: NORMAL
Neisseria Gonorrhea: NEGATIVE

## 2021-01-25 NOTE — Progress Notes (Signed)
[place] Patient and [facility] Counseling Note  The patient shared her current frustrations with the nursing home's treatment of her boyfriend and processed with counselor. The counselor and patient discussed other concerns with health professionals and her relationship with her boyfriend. The counselor provided active listening, validation of feelings, and reflections. The patient was oriented times three and in an angry mood. Her affect was extremely labile and she showed no signs of SI/HI/NSSI. Patient does not understand the ways that people communicate with her, so she has thoughts like "They think I'm stupid," "They are mistreating me," or "I am stupid." These thoughts bring up anger, hurt, sadness and more. These feelings result in the patient lashing out, further distancing herself from others.The anger she experiences appears to act as a shield to keep herself from being vulnerable. If patient can increase positive interactions with others, these feelings may decrease. Next session will process boyfriend's hospitalization and finding a new provider.    [name] Counseling Intern

## 2021-01-25 NOTE — Telephone Encounter (Signed)
Patient called to check in and confirm she would attend today's session. Counselor provided empathic listening to patient's concerns over her boyfriend.    [name] Counseling Intern

## 2021-01-30 ENCOUNTER — Encounter: Payer: Self-pay | Admitting: General Practice

## 2021-01-30 ENCOUNTER — Telehealth: Payer: Self-pay

## 2021-01-30 NOTE — Telephone Encounter (Signed)
Returned patient VM to confirm session.  [name] Counseling Intern

## 2021-01-30 NOTE — Progress Notes (Signed)
[place] Spiritual Care Note  Received call from [place] to process her mixed feelings about her relationship with her brother who has end-stage renal disease. She used the opportunity well to share and process her love and anger/resentment, and she plans to reach out again as needs arise.   [place], [place], [facility] Pager [phone] Voicemail [phone]

## 2021-02-01 NOTE — Progress Notes (Signed)
[place] Patient and [facility] Counseling Note  The counselor and patient discussed the patient's conflicts with family members and the emotions surrounding these conflicts. The patient frequently expressed a fear of her brother already being dead. The counselor and patient also discussed her boyfriend, her daughter, and nursing homes. The counselor provided active listening, normalization of feelings, and reflections of emotion. The patient was oriented times three and in an angry mood. Her affect was labile and she briefly cried multiple times. She showed no signs of SI/HI/NSSI. Patient does not understand the ways that people communicate with her, so she has thoughts like "They think I'm stupid," "They are mistreating me," or "I am stupid." These thoughts bring up anger, hurt, sadness and more. These feelings result in the patient lashing out, further distancing herself from others.The anger she experiences appears to act as a shield to keep herself from being vulnerable. If patient can increase positive interactions with others, these feelings may decrease. Next session will begin discussing termination and referrals.   [name] Counseling Intern

## 2021-02-02 ENCOUNTER — Telehealth: Payer: Self-pay | Admitting: *Deleted

## 2021-02-02 NOTE — Telephone Encounter (Signed)
Returned the patient's call and she stated "I'm not seeing that doctor at [facility], I'm going to see Dr Abner Greenspan at [facility].I I see him next month.  My stress, angry and anxiety levels are coming down."

## 2021-02-07 ENCOUNTER — Telehealth: Payer: Self-pay

## 2021-02-07 NOTE — Telephone Encounter (Signed)
Returned patient VM to process current concerns.   [name] Counseling Intern

## 2021-02-07 NOTE — Telephone Encounter (Signed)
Left VM to confirm date and time of session tomorrow.

## 2021-02-07 NOTE — Telephone Encounter (Signed)
Patient called and requested a call back from intern.  [name] Counseling Intern

## 2021-02-08 NOTE — Progress Notes (Signed)
[place] Patient and [facility] Counseling Note  The patient shared her recent concerns around getting a colonoscopy. The patient and counselor discussed specialists mistreating her, the nursing home that her boyfriend is in, as well as struggles with family, reading, and her concern that the counselor is leaving. The counselor also provided referrals and psychoeducation about what to expect from a new provider. The counselor offered empathy, active listening, and reflections. She was oriented times three and in an angry mood. Her affect was labile and she showed no signs of SI/HI/NSSI. Patient does not understand the ways that people communicate with her, so she has thoughts like "They think I'm stupid," "They are mistreating me," or "I am stupid." These thoughts bring up anger, hurt, sadness and more. These feelings result in the patient lashing out, further distancing herself from others.The anger she experiences appears to act as a shield to keep herself from being vulnerable. If patient can increase positive interactions with others, these feelings may decrease. Next session will discuss the end of the counseling relationship.   [name] Counseling Intern

## 2021-02-12 ENCOUNTER — Telehealth: Payer: Self-pay | Admitting: Oncology

## 2021-02-12 NOTE — Telephone Encounter (Signed)
Jordan [name] called and was upset about an incident that happened at her boyfriend's nursing home.  Actively listened and provided support.

## 2021-02-13 ENCOUNTER — Telehealth: Payer: Self-pay

## 2021-02-13 NOTE — Telephone Encounter (Signed)
Jordan [name] stated that her PCP said that she needs to see a gyn physician to manage her menopausal symptoms. Set an appointment up for her with her gyn Arlina Robes on 03-12-21 at 3:15 pm LM with information of appointment and address and phone number for office per patient request.

## 2021-02-13 NOTE — Telephone Encounter (Signed)
Patient called to check on appointment date and said none of the referrals accept her insurance plan.  [name] Counseling Intern

## 2021-02-15 ENCOUNTER — Encounter: Payer: Self-pay | Admitting: General Practice

## 2021-02-15 NOTE — Progress Notes (Signed)
[place] Patient and [facility] Counseling Note   The counselor and patient processed her concerns surrounding her boyfriend, his nursing home, and the workers there. The counselor and patient also discussed frustration with pt's daughter, her love/hate relationship with her brother, and her frustrations with the counselor leaving and there not being full time counselors on site. The counselor identified pt strengths and said goodbye to the patient. The counselor utilized active listening and reflection of emotions. The patient was in an angry mood with labile affect. She was oriented times three and showed no SI/NSSI/HI. Patient does not understand the ways that people communicate with her, so she has thoughts like "They think I'm stupid," "They are mistreating me," or "I am stupid." These thoughts bring up anger, hurt, sadness and more. These feelings result in the patient lashing out, further distancing herself from others.The anger she experiences appears to act as a shield to keep herself from being vulnerable. If patient can increase positive interactions with others, these feelings may decrease.   [name] Counseling Intern

## 2021-02-16 NOTE — Progress Notes (Signed)
[place] Note  Met with Jordan [name] following her appointment with Counseling Intern [name] for reassurance that our Support Team will help her find a fitting referral for counseling in the community now that [facility] internship has concluded, and Adrien has been out of active treatment for some time. Raquel Sarna has provided a list of four counseling centers that provide comprehensive evaluation and should take [name]'s insurance: [employer], [place]. Sahian is aware that [place] is a Designer, jewellery, but not a permanent or counseling relationship.   [place], [place], [facility] Pager [phone] Voicemail [phone]

## 2021-02-20 ENCOUNTER — Encounter: Payer: Self-pay | Admitting: General Practice

## 2021-02-20 NOTE — Progress Notes (Signed)
[place] Note  Per [facility], where Margretta's PCP is located, Amberg Ameh/PMHNP-BC, FNP-C provides psychiatric care and counseling and takes Arayah's Medicaid, [place] [employer]. Phoned Inette to let her know; Charnette plans to verify coverage and seek appointment.  [place], [place], [facility] Pager [phone] Voicemail [phone]

## 2021-02-20 NOTE — Progress Notes (Signed)
[place] Note  Jordan [name] has made multiple calls to providers trying to find a counselor who takes her insurance, [employer]. (I have had trouble too!) Her next steps are to ask her psychiatrist and her PCP for a referral. We plan to check in on progress next week.   [place], [place], [facility] Pager [phone] Voicemail [phone]

## 2021-03-02 ENCOUNTER — Encounter: Payer: Self-pay | Admitting: General Practice

## 2021-03-02 NOTE — Progress Notes (Signed)
[place] Spiritual Care Note  Received and returned phone call looking for food resources. Consulted with social workers, then suggested [facility], 9-12 on Saturdays, at [employer], [address] [place].   [place], [place], [facility] Pager [phone] Voicemail [phone]

## 2021-03-05 ENCOUNTER — Encounter: Payer: Self-pay | Admitting: Physical Therapy

## 2021-03-05 ENCOUNTER — Other Ambulatory Visit: Payer: Self-pay

## 2021-03-05 ENCOUNTER — Ambulatory Visit: Payer: Medicaid Other | Attending: Gynecologic Oncology | Admitting: Physical Therapy

## 2021-03-05 DIAGNOSIS — R278 Other lack of coordination: Secondary | ICD-10-CM | POA: Insufficient documentation

## 2021-03-05 DIAGNOSIS — M6281 Muscle weakness (generalized): Secondary | ICD-10-CM

## 2021-03-05 DIAGNOSIS — N39498 Other specified urinary incontinence: Secondary | ICD-10-CM

## 2021-03-05 DIAGNOSIS — R102 Pelvic and perineal pain: Secondary | ICD-10-CM

## 2021-03-05 DIAGNOSIS — R252 Cramp and spasm: Secondary | ICD-10-CM | POA: Diagnosis present

## 2021-03-05 DIAGNOSIS — C52 Malignant neoplasm of vagina: Secondary | ICD-10-CM

## 2021-03-05 NOTE — Therapy (Signed)
[facility] [address], [place], [place], [address] Phone: [phone]   Fax:  [phone]  Physical Therapy Evaluation  Patient Details  Name: Jordan [name] MRN: [id number] Date of Birth: 1966/10/12 Referring Provider (PT): Joylene John, NP   Encounter Date: 03/05/2021   PT End of Session - 03/05/21 1401    Visit Number 1    Date for PT Re-Evaluation 07/06/21    Authorization Type MCD    PT Start Time 1230    PT Stop Time 1315    PT Time Calculation (min) 45 min    Activity Tolerance Patient tolerated treatment well    Behavior During Therapy [facility] for tasks assessed/performed           Past Medical History:  Diagnosis Date  . Alcohol abuse   . Anemia    patient denies  . Bipolar 1 disorder (HCC)    No medications currently  . CAP (community acquired pneumonia) 03/17/2015  . History of radiation therapy 08/24/19-09/22/19   Vaginal Radiation;Dr. Gery Pray  . 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 Mental disorder   . Obesity   . PICC line infection 05/17/2015  . Sepsis due to Gram negative bacteria (MDR E Coli) 02/18/2015  . 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 UTI (lower urinary tract infection)   . Vaginal Pap smear, abnormal     Past Surgical History:  Procedure Laterality Date  . ABDOMINAL HYSTERECTOMY    . CERVICAL CONIZATION W/BX N/A 07/01/2016   Procedure: CONIZATION CERVIX WITH BIOPSY;  Surgeon: Chancy Milroy, MD;  Location: [facility];  Service: Gynecology;  Laterality: N/A;  . CHOLECYSTECTOMY    . EXTERNAL FIXATION LEG Right 04/09/2020   Procedure: EXTERNAL FIXATION LEG;  Surgeon: Hiram Gash, MD;  Location: [place];  Service: Orthopedics;  Laterality: Right;  . HYSTEROSCOPY WITH D & C N/A 01/25/2016   Procedure: DILATATION AND CURETTAGE /HYSTEROSCOPY;  Surgeon: Mora Bellman, MD;  Location: [facility];  Service: Gynecology;  Laterality: N/A;  . LYMPH NODE BIOPSY  Bilateral 07/22/2019   Procedure: LYMPH NODE BIOPSY;  Surgeon: Everitt Amber, MD;  Location: [facility];  Service: Gynecology;  Laterality: Bilateral;  . OPEN REDUCTION INTERNAL FIXATION (ORIF) TIBIA/FIBULA FRACTURE Right 04/10/2020   Procedure: OPEN REDUCTION INTERNAL FIXATION (ORIF) TIBIA/FIBULA FRACTURE;  Surgeon: Shona Needles, MD;  Location: [place];  Service: Orthopedics;  Laterality: Right;  . ROBOT ASSISTED MYOMECTOMY N/A 07/22/2019   Procedure: XI ROBOTIC ASSISTED LAPAROSCOPIC RADICAL UPPER VAGINECTOMY, LEFT SALPINECTOMY, RIGHT SALPINGOOOPHERECTOMY;  Surgeon: Everitt Amber, MD;  Location: [facility];  Service: Gynecology;  Laterality: N/A;  . VAGINAL HYSTERECTOMY N/A 03/11/2017   Procedure: HYSTERECTOMY VAGINAL WITH MORCELLATION;  Surgeon: Chancy Milroy, MD;  Location: [facility];  Service: Gynecology;  Laterality: N/A;    There were no vitals filed for this visit.    Subjective Assessment - 03/05/21 1241    Subjective Vaginal pain has improved. Pain is on the inside. Had radiation in the vaginal area    How long can you stand comfortably? ---    How long can you walk comfortably? --    Patient Stated Goals decrease pain vaginally    Currently in Pain? Yes    Pain Score 2    increase to 10/10   Pain Location Vagina    Pain Orientation Mid    Pain Descriptors / Indicators Burning    Pain Type Acute pain  Pain Onset More than a month ago    Pain Frequency Intermittent    Aggravating Factors  sitting, taking a shower, walking    Pain Relieving Factors pain medication    Multiple Pain Sites No              [facility] PT Assessment - 03/05/21 0001      Assessment   Medical Diagnosis C52 squamous cell carcinoma of vagina; R10.2 Vaginal pain    Referring Provider (PT) Joylene John, NP    Onset Date/Surgical Date 08/24/19    Prior Therapy not for pelvic pain      Precautions   Precautions Other (comment)    Precaution Comments cancer with radiation      Restrictions   Weight Bearing  Restrictions No      Balance Screen   Has the patient fallen in the past 6 months No    Has the patient had a decrease in activity level because of a fear of falling?  No    Is the patient reluctant to leave their home because of a fear of falling?  No      Home Ecologist residence      Prior Function   Level of Independence Independent    Vocation On disability      Cognition   Overall Cognitive Status Within Functional Limits for tasks assessed      Observation/Other Assessments   Focus on Therapeutic Outcomes (FOTO)  No FOTO - MCD      Posture/Postural Control   Posture/Postural Control No significant limitations      ROM / Strength   AROM / PROM / Strength AROM;PROM;Strength      PROM   Overall PROM Comments full bilateral hip ROM      Palpation   Palpation comment tenderness in the lower abdominal, pubic bone, ischiocavernosus                      Objective measurements completed on examination: See above findings.     Pelvic Floor Special Questions - 03/05/21 0001    Currently Sexually Active No    Urinary Leakage Yes    Pad use diaper 2; night 3    Activities that cause leaking With strong urge;Coughing;Walking;Other    Other activities that cause leaking just happens without knowing it    Urinary urgency Yes    Urinary frequency none    Fluid intake reduces fluid due to leakage    Skin Integrity Intact   dryness   External Palpation tenderness in the symphysis pubis, bil. ischiocavernosus    Pelvic Floor Internal Exam Patient confirms identificaiton and approves PT to assess pelvic floor assessment and treatment    Exam Type Vaginal    Palpation tenderness located on perineal body, puborectalis, ischiococcygeus; vaginal canal was 1.5 inches long and shortened    Strength weak squeeze, no lift                      PT Short Term Goals - 03/05/21 1516      PT SHORT TERM GOAL #1   Title independent  with initial HEP    Baseline not educated yet    Time 4    Period Weeks    Status New    Target Date 04/02/21      PT SHORT TERM GOAL #2   Title ---    Baseline ---  PT SHORT TERM GOAL #3   Title ---    Baseline ---             PT Long Term Goals - 03/05/21 1517      PT LONG TERM GOAL #1   Title independent with her HEP to perfrom manual work to the pelvic floor and understand vaginal health    Baseline not educated yet    Time 4    Period Months    Status New    Target Date 07/06/21      PT LONG TERM GOAL #2   Title able to use the vaginal dilator to increase the vaginal canal depth and reduce pain for vaginal exam    Baseline pain is 10/10 and not able to use the dilator at this time    Time 4    Period Months    Status New    Target Date 07/06/21      PT LONG TERM GOAL #3   Title urinary leakage decreased so she is using 1 diaper to none during the day and night    Baseline wears 2 diapers during the day and 3 at night    Time 4    Period Months    Status New    Target Date 07/06/21      PT LONG TERM GOAL #4   Title vaginal pain with sitting and walking decreased </= 1-2/10 due to improved tissue mobility and trigger points    Baseline pain level 2-10/10    Time 4    Period Months    Status New    Target Date 07/06/21      PT LONG TERM GOAL #5   Title ---    Baseline ---                  Plan - 03/05/21 1402    Clinical Impression Statement Patient is a 55 year old female with vaginal pain since she had radiation due to squamous cell carcioma on 05/28/2019. Patient had radiation from 08/24/2019 to 09/22/2019. She has a filator but has not used it due to vaginal pain. Patient reports her vaginal pain ranges from 2/10-10/10 and is intermittent with walking, sitting and taking a shower. Patient reports urinary leakage  with strong urge, coughing, walking, and happens without her knowing. Pelvic floor strength is 2/5 anteriorly and posteriorly. Her  vaginal canal is 1.5 inches in depth. She has tenderness located in the ischiocavernosus, perineal body, levator ani, and lower abdomen. Patient would benefit from skilled therapy to improve pelvic floor coordination and improve pain so she can return to using her dilator and reduce her leakage.    Personal Factors and Comorbidities Comorbidity 3+;Fitness    Comorbidities mental disorder; bipolar; vaginal cancer with radiation from 08/24/19-09/22/19; Abodminal hysterectomy 03/11/2017; Myomectomy 07/22/19    Examination-Activity Limitations Continence;Locomotion Level;Sit    Examination-Participation Restrictions Community Activity    Stability/Clinical Decision Making Evolving/Moderate complexity    Clinical Decision Making Moderate    Rehab Potential Good    PT Frequency 1x / week    PT Duration Other (comment)   4 months   PT Treatment/Interventions ADLs/Self Care Home Management;Biofeedback;Therapeutic activities;Therapeutic exercise;Neuromuscular re-education;Patient/family education;Manual techniques    PT Next Visit Plan manual work to pelvic floor and internall; abdominal manual work; diaphragmatic breathing to relax the pelvic floor, vaginal health    Consulted and Agree with Plan of Care Patient           Patient  will benefit from skilled therapeutic intervention in order to improve the following deficits and impairments:  Decreased coordination,Increased fascial restricitons,Pain,Decreased strength,Decreased activity tolerance,Decreased endurance,Increased muscle spasms  Visit Diagnosis: Muscle weakness (generalized) - Plan: PT plan of care cert/re-cert  Other lack of coordination - Plan: PT plan of care cert/re-cert  Cramp and spasm - Plan: PT plan of care cert/re-cert  Pelvic pain - Plan: PT plan of care cert/re-cert  Other urinary incontinence - Plan: PT plan of care cert/re-cert  Vaginal cancer [facility]) - Plan: PT plan of care cert/re-cert     Problem List Patient Active  Problem List   Diagnosis Date Noted  . Acute blood loss anemia   . Leukopenia   . Vitamin D deficiency   . Acute lower UTI   . Postoperative pain   . Tibia/fibula fracture, right, sequela 04/15/2020  . Closed fracture of medial malleolus of right ankle 04/10/2020  . Open fracture of distal end of fibula and tibia, right, type I or II, initial encounter 04/09/2020  . Vaginal cancer ([place]) 07/22/2019  . Morbid obesity with BMI of 40.0-44.9, adult ([place]) 06/11/2019  . 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 Visit for routine gyn exam 04/21/2019  . Vaginal atrophy 04/21/2019  . Painful lumpy right breast 07/27/2016  . Urinary tract infection 07/01/2015  . Diastolic dysfunction 16/07/9603  . Constipation 05/12/2015  . ESBL (extended spectrum beta-lactamase) producing bacteria infection 03/17/2015  . Diarrhea 03/03/2015  . Weakness 03/02/2015  . 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 Anxiety state 02/20/2015  . Claustrophobia 02/20/2015  . Transaminitis 02/18/2015  . Chest pain 02/18/2015  . Abdominal pain   . Dizziness   . Hypotension 01/04/2015  . Bipolar disorder ([place]) 06/29/2012    Earlie Counts, PT [facility] [address] [place], [place], [address] Phone: [phone]   Fax:  [phone]  Name: Jalise Zawistowski MRN: [id number] Date of Birth: [birth year]

## 2021-03-06 ENCOUNTER — Telehealth: Payer: Self-pay | Admitting: *Deleted

## 2021-03-06 NOTE — Telephone Encounter (Signed)
Jordan [name] called to find out the phone number to her orthopedic surgeon's office. She was given the phone number to the orthopedic trauma specialist office that did her surgery last June.

## 2021-03-08 ENCOUNTER — Telehealth: Payer: Self-pay | Admitting: Oncology

## 2021-03-08 NOTE — Telephone Encounter (Signed)
Jaiya called and asked if she can pick up lubricant for the dilator.  Advised her to call Dr. Clabe Seal nurse.

## 2021-03-12 ENCOUNTER — Telehealth: Payer: Self-pay | Admitting: *Deleted

## 2021-03-12 ENCOUNTER — Ambulatory Visit: Payer: Medicaid Other | Admitting: Obstetrics and Gynecology

## 2021-03-12 NOTE — Telephone Encounter (Signed)
Patient called and rescheduled her appt from 6/17 to 6/6 due to transportation

## 2021-03-13 ENCOUNTER — Telehealth: Payer: Self-pay | Admitting: Oncology

## 2021-03-13 NOTE — Telephone Encounter (Signed)
Jordan [name] called and said that she can have 22 visits per year that are covered with Medicaid.  She is wondering how many visits she has had so far this year.  Advised her I can tell her how many past visits she has had through [facility] but not through her primary care doctor.  Encouraged her to call Medicaid to see if they can tell her how many visits she has used.

## 2021-03-15 ENCOUNTER — Encounter: Payer: Self-pay | Admitting: General Practice

## 2021-03-15 NOTE — Progress Notes (Signed)
[facility] [place] [facility]ual [facility] Note  Returned Jordan [name]'s call at an inconvenient time, so she plans to phone back tomorrow.   [place], [place], [facility] Pager [phone] Voicemail [phone]

## 2021-03-16 ENCOUNTER — Telehealth: Payer: Self-pay

## 2021-03-16 NOTE — Telephone Encounter (Signed)
Jordan [name] called this morning requesting an appointment reminder be mailed to her for her upcoming appointment with [name] on June 6th. It has been printed and will be mailed today.

## 2021-03-21 ENCOUNTER — Ambulatory Visit: Payer: Medicaid Other | Admitting: Obstetrics and Gynecology

## 2021-03-22 ENCOUNTER — Encounter: Payer: Self-pay | Admitting: Gynecologic Oncology

## 2021-03-22 ENCOUNTER — Encounter: Payer: Self-pay | Admitting: General Practice

## 2021-03-22 NOTE — Progress Notes (Signed)
[facility] Spiritual Care Note  Received call from [place] for stopgap emotional support while she is between counselors. She used the opportunity to process family concerns and hopes that her upcoming appointment with Dr Denman George will go smoothly. She has been working hard to find a counselor who would available before late July because she so acutely feels the need to have someone to talk to and process with. Provided empathic listening, normalization of feelings, and emotional support. We plan to follow up by phone mid-month.   [place], [place], [facility] Pager [phone] Voicemail [phone]

## 2021-03-26 ENCOUNTER — Other Ambulatory Visit: Payer: Self-pay

## 2021-03-26 ENCOUNTER — Inpatient Hospital Stay: Payer: Medicaid Other | Attending: Obstetrics & Gynecology | Admitting: Gynecologic Oncology

## 2021-03-26 ENCOUNTER — Other Ambulatory Visit (HOSPITAL_COMMUNITY)
Admission: RE | Admit: 2021-03-26 | Discharge: 2021-03-26 | Disposition: A | Discharge status: Home / Self Care | Payer: Medicaid Other | Source: Ambulatory Visit | Attending: Gynecologic Oncology | Admitting: Gynecologic Oncology

## 2021-03-26 VITALS — BP 118/86 | HR 98 | Temp 97.5°F | Resp 18 | Ht 68.0 in | Wt 232.2 lb

## 2021-03-26 DIAGNOSIS — C52 Malignant neoplasm of vagina: Secondary | ICD-10-CM | POA: Insufficient documentation

## 2021-03-26 DIAGNOSIS — Z8544 Personal history of malignant neoplasm of other female genital organs: Secondary | ICD-10-CM | POA: Diagnosis present

## 2021-03-26 DIAGNOSIS — Z90710 Acquired absence of both cervix and uterus: Secondary | ICD-10-CM | POA: Insufficient documentation

## 2021-03-26 DIAGNOSIS — Z803 Family history of malignant neoplasm of breast: Secondary | ICD-10-CM | POA: Insufficient documentation

## 2021-03-26 DIAGNOSIS — Z87440 Personal history of urinary (tract) infections: Secondary | ICD-10-CM | POA: Insufficient documentation

## 2021-03-26 DIAGNOSIS — F319 Bipolar disorder, unspecified: Secondary | ICD-10-CM | POA: Insufficient documentation

## 2021-03-26 DIAGNOSIS — Z791 Long term (current) use of non-steroidal anti-inflammatories (NSAID): Secondary | ICD-10-CM | POA: Diagnosis not present

## 2021-03-26 DIAGNOSIS — Z86001 Personal history of in-situ neoplasm of cervix uteri: Secondary | ICD-10-CM | POA: Insufficient documentation

## 2021-03-26 DIAGNOSIS — Z90721 Acquired absence of ovaries, unilateral: Secondary | ICD-10-CM | POA: Insufficient documentation

## 2021-03-26 DIAGNOSIS — Z7952 Long term (current) use of systemic steroids: Secondary | ICD-10-CM | POA: Insufficient documentation

## 2021-03-26 DIAGNOSIS — Z9079 Acquired absence of other genital organ(s): Secondary | ICD-10-CM | POA: Diagnosis not present

## 2021-03-26 DIAGNOSIS — R102 Pelvic and perineal pain: Secondary | ICD-10-CM | POA: Insufficient documentation

## 2021-03-26 DIAGNOSIS — Z923 Personal history of irradiation: Secondary | ICD-10-CM | POA: Insufficient documentation

## 2021-03-26 DIAGNOSIS — Z79899 Other long term (current) drug therapy: Secondary | ICD-10-CM | POA: Diagnosis not present

## 2021-03-26 DIAGNOSIS — E669 Obesity, unspecified: Secondary | ICD-10-CM | POA: Diagnosis not present

## 2021-03-26 MED ORDER — IBUPROFEN 600 MG PO TABS: 600.0000 mg | ORAL_TABLET | Freq: Four times a day (QID) | ORAL | 0 refills | Status: DC | PRN

## 2021-03-26 NOTE — Patient Instructions (Addendum)
Follow up with [name] in  3 months.  We will call you with the results of the PAP Smear when available.

## 2021-03-26 NOTE — Progress Notes (Signed)
Follow-up Note: Gyn-Onc  Consult was requested by Dr. Rip Harbour for the evaluation of Jordan [name] 55 y.o. female  CC:  Chief Complaint  Patient presents with  . Vaginal cancer [facility])    Assessment/Plan:  Jordan [name]  is a 54 y.o.  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 S/p adjuvant vaginal brachytherapy for positive margins on specimen. Treatment completed December, 2021.  She will return to see me in 6 months and Dr Sondra Come in 3 months. We will continue 3 monthly surveillance for 2 years (until December, 2023).  I recommend pap testing of the vagina annually in June.  Pap taken today - if dysplastic she will return for colpo directed biopsies. If benign, she will return for surveillance in 6 months (3 months after Dr Sondra Come).  She will continue to see pelvic PT for pelvic pain - no organic source of pain identified.   HPI: [name] is a 55 year old P1 who is seen in consultation at the request of Dr Rip Harbour for squamous cell carcinoma of the vulva.  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  A follow-up Pap smear in 2018 showed H SIL.  She then underwent a vaginal hysterectomy which confirmed residual CIN-3 with negative margins at the hysterectomy specimen.  She underwent a Pap test for surveillance in April 21, 2019 which revealed the AIN 2-3, H SIL.  Subsequently a vaginal colposcopy was performed on May 28, 2019.  No visible lesions were seen.  There was minimal acetowhite changes noted after acetic acid was applied that was not noted which location.  Biopsies were obtained from the vagina.  Final pathology revealed poorly differentiated squamous cell carcinoma.  The tumor cells were positive for CK 5 6, p63, and P 16.  The patient has a history of bipolar disorder for which she intermittently takes medications. .   On June 18, 2019 she  underwent staging CT scan of the chest abdomen and pelvis.  This revealed that she was status post hysterectomy with no evidence of metastatic disease in the abdomen or pelvis or chest.  There was a 6 mm right lower lobe that was grossly unchanged from a 2017 CT scan felt to be benign.  A compression fracture was seen at L2 unchanged from prior radiographs.  An MRI of the pelvis with IV contrast was performed on June 26, 2019.  It revealed susceptibility artifact in the vaginal cuff from suture material limiting evaluation.  However no focal soft tissue masses were seen involving the vaginal cuff and no abnormal soft tissue densities in the parametrial regions.  The right and left ovaries were grossly normal.  On July 22, 2019 she underwent a robotic assisted radical upper vaginectomy with bilateral pelvic sentinel lymph node biopsy, right salpingo-oophorectomy, left salpingectomy.  Intraoperative findings were significant for no grossly visible vaginal lesions.  The ovaries and fallopian tubes were adherent to the rectum and vaginal cuff in the obliterated cul-de-sac.  The ovaries themselves are grossly normal-appearing but densely adherent with no suspicious lymph nodes.  There is clinical stage I disease, microscopic.  Postoperatively final pathology returned as FIGO stage Ia microscopic poorly differentiated squamous cell carcinoma.  The tumor site was the posterior vaginal wall and measured 0.6 cm.  The posterior margin (the posterior vaginal cuff margin) was broadly positive for tumor cells.  The deep margin was negative.  There was 2.5 mm depth of invasion.  All sentinel lymph nodes were negative for metastases, as was the left fallopian tube and right tube and ovary.  Due to positive margin status she was recommended adjuvant vaginal radiation postoperatively.  She completed adjuvant therapy with vaginal brachytherapy: Radiation Treatment Dates: 08/24/2019 through 09/22/2019 Site Technique  Total Dose (Gy) Dose per Fx (Gy) Completed Fx Beam Energies  Pelvis: Pelvis_vagina HDR-brachy 30/30 6 5/5 Ir-192   PET/CT on 09/15/20 showed soft tissue attenuating structure is noted within the left pelvis measuring 1.7 cm. This was favored to be a left ovary. Recommended MRI in 3 months for follow-up. No clear evidence for recurrence.   She had been seen at [facility] for recurrent UTI's. A CT abd/pelvis was ordered which showed findings concerning for an enlarged pelvic lymph node. Follow-up PET on 09/15/20 showed a soft tissue structure noted within the left pelvis measuring 1.7 cm.  This is consistent with the left ovary.  It was not concerning for metastatic adenopathy.  They recommended follow-up MRI to confirm stability. MRI pelvis w/wo contrast in February, 2022 to follow-up the previously seen adnexal mass showed an atrophic ovary at the vaginal cuff. No evidence of recurrence.  She had been referred to pelvic PT for chronic pelvic and vaginal pain without an apparent organic source.   Interval Hx:  Today she presented for follow-up with no symptoms concerning for recurrence.    Current Meds:  Outpatient Encounter Medications as of 03/26/2021  Medication Sig  . Baclofen 5 MG TABS Take 1 tablet by mouth 3 (three) times daily as needed.  . Ferrous Sulfate (IRON) 325 (65 Fe) MG TABS Take 1 tablet by mouth every other day.  . gabapentin (NEURONTIN) 400 MG capsule Take 400 mg by mouth 3 (three) times daily.  Marland Kitchen ibuprofen (ADVIL) 600 MG tablet Take 1 tablet (600 mg total) by mouth every 6 (six) hours as needed.  Marland Kitchen oxybutynin (DITROPAN XL) 15 MG 24 hr tablet Take 15 mg by mouth 2 (two) times daily.  . Oxycodone HCl 10 MG TABS Take 1-1.5 tablets (10-15 mg total) by mouth 4 (four) times daily as needed.  . Vitamin D, Ergocalciferol, (DRISDOL) 1.25 MG (50000 UNIT) CAPS capsule Take 50,000 Units by mouth. Every 14 days  . acetaminophen (TYLENOL) 325 MG tablet Take 1-2 tablets (325-650 mg total)  by mouth every 4 (four) hours as needed for mild pain. (Patient not taking: No sig reported)  . cromolyn (OPTICROM) 4 % ophthalmic solution 1 drop 2 (two) times daily. (Patient not taking: No sig reported)  . fluconazole (DIFLUCAN) 150 MG tablet Take 150 mg by mouth every other day.  . neomycin-polymyxin b-dexamethasone (MAXITROL) 3.5-10000-0.1 OINT SMARTSIG:1 Inch(es) In Eye(s) Every Night (Patient not taking: No sig reported)  . phenazopyridine (PYRIDIUM) 200 MG tablet TAKE 1 TABLET BY MOUTH THREE TIMES DAILY AS NEEDED FOR BLADDER PAIN (Patient not taking: Reported on 03/22/2021)  . prednisoLONE acetate (PRED FORTE) 1 % ophthalmic suspension 1 drop 2 (two) times daily. (Patient not taking: No sig reported)  . RESTASIS 0.05 % ophthalmic emulsion 1 drop 2 (two) times daily. (Patient not taking: No sig reported)  . [DISCONTINUED] cephALEXin (KEFLEX) 500 MG capsule Take 1 capsule (500 mg total) by mouth 2 (two) times daily.  . [DISCONTINUED] desvenlafaxine (PRISTIQ) 50 MG 24 hr tablet Take 50 mg by mouth daily.  . [DISCONTINUED] nitrofurantoin, macrocrystal-monohydrate, (MACROBID) 100 MG capsule Take 100 mg by mouth 2 (two) times daily.  . [DISCONTINUED]  oxybutynin (DITROPAN-XL) 10 MG 24 hr tablet Take 10 mg by mouth daily.   No facility-administered encounter medications on file as of 03/26/2021.    Allergy:  Allergies  Allergen Reactions  . Ciprofloxacin Swelling    Lips swell, tongue swells, face swells  . Citalopram Other (See Comments)    Possible cause of pancytopenia. Swelling of tongue, face and throat  . Lamotrigine Other (See Comments)    Possible cause of pancytopenia. Swelling of face, throat and tongue  . Sulfa Antibiotics Anaphylaxis  . Tramadol Anaphylaxis, Shortness Of Breath and Swelling    Social Hx:   Social History   Socioeconomic History  . Marital status: Soil scientist    Spouse name: Not on file  . Number of children: Not on file  . Years of education: Not on  file  . Highest education level: Not on file  Occupational History  . Not on file  Tobacco Use  . Smoking status: Never Smoker  . Smokeless tobacco: Never Used  Vaping Use  . Vaping Use: Never used  Substance and Sexual Activity  . Alcohol use: Not Currently  . Drug use: No  . Sexual activity: Yes    Birth control/protection: Post-menopausal, Surgical    Comment: perimenopausal; no sex in years  Other Topics Concern  . Not on file  Social History Narrative  . Not on file   Social Determinants of Health   Financial Resource Strain: Not on file  Food Insecurity: Not on file  Transportation Needs: Not on file  Physical Activity: Not on file  Stress: Not on file  Social Connections: Not on file  Intimate Partner Violence: Not on file    Past Surgical Hx:  Past Surgical History:  Procedure Laterality Date  . ABDOMINAL HYSTERECTOMY    . CERVICAL CONIZATION W/BX N/A 07/01/2016   Procedure: CONIZATION CERVIX WITH BIOPSY;  Surgeon: Chancy Milroy, MD;  Location: [facility];  Service: Gynecology;  Laterality: N/A;  . CHOLECYSTECTOMY    . EXTERNAL FIXATION LEG Right 04/09/2020   Procedure: EXTERNAL FIXATION LEG;  Surgeon: Hiram Gash, MD;  Location: [place];  Service: Orthopedics;  Laterality: Right;  . HYSTEROSCOPY WITH D & C N/A 01/25/2016   Procedure: DILATATION AND CURETTAGE /HYSTEROSCOPY;  Surgeon: Mora Bellman, MD;  Location: [facility];  Service: Gynecology;  Laterality: N/A;  . LYMPH NODE BIOPSY Bilateral 07/22/2019   Procedure: LYMPH NODE BIOPSY;  Surgeon: Everitt Amber, MD;  Location: [facility];  Service: Gynecology;  Laterality: Bilateral;  . OPEN REDUCTION INTERNAL FIXATION (ORIF) TIBIA/FIBULA FRACTURE Right 04/10/2020   Procedure: OPEN REDUCTION INTERNAL FIXATION (ORIF) TIBIA/FIBULA FRACTURE;  Surgeon: Shona Needles, MD;  Location: [place];  Service: Orthopedics;  Laterality: Right;  . ROBOT ASSISTED MYOMECTOMY N/A 07/22/2019   Procedure: XI ROBOTIC ASSISTED LAPAROSCOPIC RADICAL UPPER  VAGINECTOMY, LEFT SALPINECTOMY, RIGHT SALPINGOOOPHERECTOMY;  Surgeon: Everitt Amber, MD;  Location: [facility];  Service: Gynecology;  Laterality: N/A;  . VAGINAL HYSTERECTOMY N/A 03/11/2017   Procedure: HYSTERECTOMY VAGINAL WITH MORCELLATION;  Surgeon: Chancy Milroy, MD;  Location: [facility];  Service: Gynecology;  Laterality: N/A;    Past Medical Hx:  Past Medical History:  Diagnosis Date  . Alcohol abuse   . Anemia    patient denies  . Bipolar 1 disorder (HCC)    No medications currently  . CAP (community acquired pneumonia) 03/17/2015  . History of radiation therapy 08/24/19-09/22/19   Vaginal Radiation;Dr. Gery Pray  . 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 Mental disorder   . Obesity   . PICC line infection 05/17/2015  . Sepsis due to Gram negative bacteria (MDR E Coli) 02/18/2015  . 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[place])   . UTI (lower urinary tract infection)   . Vaginal Pap smear, abnormal     Past Gynecological History:  See HPI No LMP recorded (lmp unknown). Patient has had a hysterectomy.  Family Hx:  Family History  Problem Relation Age of Onset  . Alcohol abuse Father   . Cancer Father   . Breast cancer Neg Hx     Review of Systems:  Constitutional  Feels well,    ENT Normal appearing ears and nares bilaterally Skin/Breast  No rash, sores, jaundice, itching, dryness Cardiovascular  No chest pain, shortness of breath, or edema  Pulmonary  No cough or wheeze.  Gastro Intestinal  No nausea, vomitting, or diarrhoea. No bright red blood per rectum, no abdominal pain, change in bowel movement, or constipation.  Genito Urinary  No frequency, urgency, dysuria, or discharge. Not sexually active. + vaginal pain, no bleeding.  Musculo Skeletal  No myalgia, arthralgia, joint swelling or pain  Neurologic  No weakness, numbness, change in gait,  Psychology  No depression, anxiety, insomnia.   Vitals:  Blood pressure 118/86, pulse 98,  temperature (!) 97.5 F (36.4 C), temperature source Tympanic, resp. rate 18, height 5\' 8"  (1.727 m), weight 232 lb 3.2 oz (105.3 kg), SpO2 98 %.  Physical Exam: WD in NAD Neck  Supple NROM, without any enlargements.  Lymph Node Survey No cervical supraclavicular or inguinal adenopathy Cardiovascular  Pulse normal rate, regularity and rhythm. S1 and S2 normal.  Lungs  Clear to auscultation bilateraly, without wheezes/crackles/rhonchi. Good air movement.  Skin  No rash/lesions/breakdown  Psychiatry  Alert and oriented to person, place, and time  Abdomen  Normoactive bowel sounds, abdomen soft, non-tender and obese without evidence of hernia. Soft abdominal incisions.  Back No CVA tenderness Genito Urinary  Vulva/vagina: Normal external female genitalia.  No lesions. No discharge or bleeding.  Bladder/urethra:  No lesions or masses, well supported bladder  Vagina: vaginal cuff smooth, free of lesions or masses or blood.  Rectal  deferred Extremities  No bilateral cyanosis, clubbing or edema.   Thereasa Solo, MD  03/26/2021, 4:35 PM

## 2021-03-27 ENCOUNTER — Telehealth: Payer: Self-pay

## 2021-03-27 ENCOUNTER — Encounter: Payer: Self-pay | Admitting: General Practice

## 2021-03-27 NOTE — Telephone Encounter (Signed)
Spoke with Jordan [name] this afternoon and let her express her feelings. Reinforced the importance and benefits of her going to pelvic PT and to take her pain medication as needed.

## 2021-03-27 NOTE — Progress Notes (Signed)
[facility] Spiritual Care Note  Received and returned voicemail from [place].   Chaplain Chesley Veasey, MDiv, [facility] Pager [phone] Voicemail [phone] 

## 2021-03-27 NOTE — Telephone Encounter (Signed)
Patient called in and reported that she is having severe vaginal pain, rating pain 10/10. Patient was last seen by [name] on yesterday for pelvic exam. Patient reports having dysuria, urinary urgency and frequency. Patient has history of recurring UTI's. Patient last received radiation therapy to pelvis on 08/24/19-09/22/19.  Per Dr. Serita Grit note patient to be referred to Pelvic PT for chronic pelvic and vaginal pain. [name] office to follow up with patient.

## 2021-03-29 ENCOUNTER — Telehealth: Payer: Self-pay

## 2021-03-29 LAB — CYTOLOGY - PAP
Comment: NEGATIVE
Diagnosis: NEGATIVE
Diagnosis: REACTIVE
High risk HPV: NEGATIVE

## 2021-03-29 NOTE — Telephone Encounter (Signed)
Opened in error

## 2021-03-29 NOTE — Telephone Encounter (Signed)
Told Jordan [name] that the pap smear was normal and HPV high risk negative per Joylene John, NP.

## 2021-04-03 ENCOUNTER — Encounter: Payer: Self-pay | Admitting: General Practice

## 2021-04-03 NOTE — Progress Notes (Signed)
[facility] Spiritual Care Note  Received call from Brookelynne to share and process her finding a new counselor at [facility] via assistance from [place]. Affirmed her strengths of initiative and perseverance, providing empathic listening and emotional support as well.   [place], [place], [employer] Pager [phone] Voicemail [phone]

## 2021-04-05 ENCOUNTER — Telehealth: Payer: Self-pay | Admitting: Oncology

## 2021-04-05 NOTE — Telephone Encounter (Signed)
Jordan [name] called to discuss her upcoming bladder surgery with Dr. Amalia Hailey. She is optimistic it will help with her bladder issues.  Provided active listening and support.

## 2021-04-06 ENCOUNTER — Other Ambulatory Visit: Payer: Self-pay

## 2021-04-06 ENCOUNTER — Telehealth: Payer: Self-pay | Admitting: *Deleted

## 2021-04-06 ENCOUNTER — Ambulatory Visit: Payer: Medicaid Other | Attending: Gynecologic Oncology | Admitting: Physical Therapy

## 2021-04-06 ENCOUNTER — Encounter: Payer: Self-pay | Admitting: Physical Therapy

## 2021-04-06 ENCOUNTER — Ambulatory Visit: Payer: Medicaid Other | Admitting: Gynecologic Oncology

## 2021-04-06 DIAGNOSIS — R102 Pelvic and perineal pain unspecified side: Secondary | ICD-10-CM

## 2021-04-06 DIAGNOSIS — R278 Other lack of coordination: Secondary | ICD-10-CM

## 2021-04-06 DIAGNOSIS — N39498 Other specified urinary incontinence: Secondary | ICD-10-CM

## 2021-04-06 DIAGNOSIS — C52 Malignant neoplasm of vagina: Secondary | ICD-10-CM | POA: Diagnosis present

## 2021-04-06 DIAGNOSIS — M6281 Muscle weakness (generalized): Secondary | ICD-10-CM

## 2021-04-06 DIAGNOSIS — R252 Cramp and spasm: Secondary | ICD-10-CM | POA: Diagnosis present

## 2021-04-06 NOTE — Telephone Encounter (Signed)
Return patient's call and patient stated "PT went good."

## 2021-04-06 NOTE — Therapy (Signed)
[facility] [address], [place], [place], [address] Phone: [phone]   Fax:  [phone]  Physical Therapy Treatment  Patient Details  Name: Jordan [name] MRN: [id number] Date of Birth: 26-Dec-1965 Referring Provider (PT): Joylene John, NP   Encounter Date: 04/06/2021   PT End of Session - 04/06/21 1035     Visit Number 2    Date for PT Re-Evaluation 07/06/21    Authorization Type MCD    Authorization Time Period 6/17-7/7    Authorization - Visit Number 1    Authorization - Number of Visits 3    PT Start Time 1035   came late due to transportation   PT Stop Time 1100    PT Time Calculation (min) 25 min    Activity Tolerance Patient tolerated treatment well    Behavior During Therapy [facility] for tasks assessed/performed             Past Medical History:  Diagnosis Date   Alcohol abuse    Anemia    patient denies   Bipolar 1 disorder ([place])    No medications currently   CAP (community acquired pneumonia) 03/17/2015   History of radiation therapy 08/24/19-09/22/19   Vaginal Radiation;Dr. 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   Mental disorder    Obesity    PICC line infection 05/17/2015   Sepsis due to Gram negative bacteria (MDR E Coli) 02/18/2015   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HCC)    UTI (lower urinary tract infection)    Vaginal Pap smear, abnormal     Past Surgical History:  Procedure Laterality Date   ABDOMINAL HYSTERECTOMY     CERVICAL CONIZATION W/BX N/A 07/01/2016   Procedure: CONIZATION CERVIX WITH BIOPSY;  Surgeon: Chancy Milroy, MD;  Location: [facility];  Service: Gynecology;  Laterality: N/A;   CHOLECYSTECTOMY     EXTERNAL FIXATION LEG Right 04/09/2020   Procedure: EXTERNAL FIXATION LEG;  Surgeon: Hiram Gash, MD;  Location: [place];  Service: Orthopedics;  Laterality: Right;   HYSTEROSCOPY WITH D & C N/A 01/25/2016   Procedure: DILATATION AND CURETTAGE  /HYSTEROSCOPY;  Surgeon: Mora Bellman, MD;  Location: [facility];  Service: Gynecology;  Laterality: N/A;   LYMPH NODE BIOPSY Bilateral 07/22/2019   Procedure: LYMPH NODE BIOPSY;  Surgeon: Everitt Amber, MD;  [facility];  Service: Gynecology;  Laterality: Bilateral;   OPEN REDUCTION INTERNAL FIXATION (ORIF) TIBIA/FIBULA FRACTURE Right 04/10/2020   Procedure: OPEN REDUCTION INTERNAL FIXATION (ORIF) TIBIA/FIBULA FRACTURE;  Surgeon: Shona Needles, MD;  Location: [place];  Service: Orthopedics;  Laterality: Right;   ROBOT ASSISTED MYOMECTOMY N/A 07/22/2019   Procedure: XI ROBOTIC ASSISTED LAPAROSCOPIC RADICAL UPPER VAGINECTOMY, LEFT SALPINECTOMY, RIGHT SALPINGOOOPHERECTOMY;  Surgeon: Everitt Amber, MD;  [facility];  Service: Gynecology;  Laterality: N/A;   VAGINAL HYSTERECTOMY N/A 03/11/2017   Procedure: HYSTERECTOMY VAGINAL WITH MORCELLATION;  Surgeon: Chancy Milroy, MD;  Location: [facility];  Service: Gynecology;  Laterality: N/A;    There were no vitals filed for this visit.                      [facility] Adult PT Treatment/Exercise - 04/06/21 0001       Self-Care   Self-Care Other Self-Care Comments    Other Self-Care Comments  educated patient on vaginal health with using coconut oil on the vulva and labia, not using soap on the vulvar area, letting the  vulvar area air dry  for for 30 minutes per day      Neuro Re-ed    Neuro Re-ed Details  diaphramatic breathing in supine to place air into the abdomen to relax the pelvic floor      Manual Therapy   Manual Therapy Soft tissue mobilization    Soft tissue mobilization to bilateral hip adductors and ischiocavernosus to reduce trigger points; therapist educated pateint on how to perform the manual mobilitzation at home                    PT Education - 04/06/21 1105     Education Details Access Code: [facility]; how to perform manual mobilization to bilateral hip adductors and ischocavernosus    Person(s) Educated  Patient    Methods Explanation;Demonstration;Handout    Comprehension Verbalized understanding;Returned demonstration              PT Short Term Goals - 03/05/21 1516       PT SHORT TERM GOAL #1   Title independent with initial HEP    Baseline not educated yet    Time 4    Period Weeks    Status New    Target Date 04/02/21      PT SHORT TERM GOAL #2   Title ---    Baseline ---      PT SHORT TERM GOAL #3   Title ---    Baseline ---               PT Long Term Goals - 03/05/21 1517       PT LONG TERM GOAL #1   Title independent with her HEP to perfrom manual work to the pelvic floor and understand vaginal health    Baseline not educated yet    Time 4    Period Months    Status New    Target Date 07/06/21      PT LONG TERM GOAL #2   Title able to use the vaginal dilator to increase the vaginal canal depth and reduce pain for vaginal exam    Baseline pain is 10/10 and not able to use the dilator at this time    Time 4    Period Months    Status New    Target Date 07/06/21      PT LONG TERM GOAL #3   Title urinary leakage decreased so she is using 1 diaper to none during the day and night    Baseline wears 2 diapers during the day and 3 at night    Time 4    Period Months    Status New    Target Date 07/06/21      PT LONG TERM GOAL #4   Title vaginal pain with sitting and walking decreased </= 1-2/10 due to improved tissue mobility and trigger points    Baseline pain level 2-10/10    Time 4    Period Months    Status New    Target Date 07/06/21      PT LONG TERM GOAL #5   Title ---    Baseline ---                   Plan - 04/06/21 1109     Clinical Impression Statement Patient relies on tramsportation to get to therapy but today the transportation brought her 15 minutes late. Patient was educated on vaginal health, how to perform manual mobilization of the hip adductors and  ischiocavernosus, and diaphragmatic breathing. Patient has not  met goals due to just starting therapy. Patient reports her pain is deep inside the vagina. Patient will benefit from skilled therapy to improve pelvic floor coordination and improve pain so she can return to using her dilator and reduce her leakage.    Personal Factors and Comorbidities Comorbidity 3+;Fitness    Comorbidities mental disorder; bipolar; vaginal cancer with radiation from 08/24/19-09/22/19; Abodminal hysterectomy 03/11/2017; Myomectomy 07/22/19    Examination-Activity Limitations Continence;Locomotion Level;Sit    Examination-Participation Restrictions Community Activity    Rehab Potential Good    PT Frequency 1x / week    PT Duration Other (comment)   4 months   PT Treatment/Interventions ADLs/Self Care Home Management;Biofeedback;Therapeutic activities;Therapeutic exercise;Neuromuscular re-education;Patient/family education;Manual techniques    PT Next Visit Plan manual work to pelvic floor and internall; abdominal manual work; review vaginal health    PT Home Exercise Plan Access Code: [facility]    Recommended Other Services MD signed initial note    Consulted and Agree with Plan of Care Patient             Patient will benefit from skilled therapeutic intervention in order to improve the following deficits and impairments:  Decreased coordination, Increased fascial restricitons, Pain, Decreased strength, Decreased activity tolerance, Decreased endurance, Increased muscle spasms  Visit Diagnosis: Muscle weakness (generalized)  Other lack of coordination  Cramp and spasm  Pelvic pain  Other urinary incontinence  Vaginal cancer ([place])     Problem List Patient Active Problem List   Diagnosis Date Noted   Acute blood loss anemia    Leukopenia    Vitamin D deficiency    Acute lower UTI    Postoperative pain    Tibia/fibula fracture, right, sequela 04/15/2020   Closed fracture of medial malleolus of right ankle 04/10/2020   Open fracture of distal end of fibula  and tibia, right, type I or II, initial encounter 04/09/2020   Vaginal cancer ([place]) 07/22/2019   Morbid obesity with BMI of 40.0-44.9, adult ([place]) 06/11/2019   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[place]) 05/28/2019   Visit for routine gyn exam 04/21/2019   Vaginal atrophy 04/21/2019   Painful lumpy right breast 07/27/2016   Urinary tract infection 50/93/2671   Diastolic dysfunction 24/58/0998   Constipation 05/12/2015   ESBL (extended spectrum beta-lactamase) producing bacteria infection 03/17/2015   Diarrhea 03/03/2015   Weakness 03/02/2015   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, [place], [place], [address] Phone: [phone]   Fax:  [phone]  Name: Jordan [name] MRN: [id number] Date of Birth: 24-Aug-1966

## 2021-04-06 NOTE — Patient Instructions (Signed)
Access Code: [facility] URL: [email or web address] Date: 04/06/2021 Prepared by: Earlie Counts  Program Notes massage along the inner thighs 10 strokes each inner thigh massage from the pubic bone to the ischial tuberosity 10 strokes each side   Exercises Supine Diaphragmatic Breathing - 2 x daily - 7 x weekly - 1 sets - 10 reps  [facility] [address], [place], [place] [address] Phone # [phone] Fax [phone]

## 2021-04-09 ENCOUNTER — Encounter: Payer: Self-pay | Admitting: Physical Therapy

## 2021-04-09 ENCOUNTER — Other Ambulatory Visit: Payer: Self-pay

## 2021-04-09 ENCOUNTER — Ambulatory Visit: Payer: Medicaid Other | Admitting: Physical Therapy

## 2021-04-09 DIAGNOSIS — R278 Other lack of coordination: Secondary | ICD-10-CM

## 2021-04-09 DIAGNOSIS — M6281 Muscle weakness (generalized): Secondary | ICD-10-CM | POA: Diagnosis not present

## 2021-04-09 DIAGNOSIS — N39498 Other specified urinary incontinence: Secondary | ICD-10-CM

## 2021-04-09 DIAGNOSIS — R252 Cramp and spasm: Secondary | ICD-10-CM

## 2021-04-09 DIAGNOSIS — R102 Pelvic and perineal pain unspecified side: Secondary | ICD-10-CM

## 2021-04-09 DIAGNOSIS — C52 Malignant neoplasm of vagina: Secondary | ICD-10-CM

## 2021-04-09 NOTE — Therapy (Signed)
[facility] [address], [place], [place], [address] Phone: [phone]   Fax:  [phone]  Physical Therapy Treatment  Patient Details  Name: Jordan [name] MRN: [id number] Date of Birth: 03-01-1966 Referring Provider (PT): Joylene John, NP   Encounter Date: 04/09/2021   PT End of Session - 04/09/21 1432     Visit Number 3    Date for PT Re-Evaluation 07/06/21    Authorization Type MCD    Authorization Time Period 6/17-7/7    Authorization - Visit Number 2    Authorization - Number of Visits 3    PT Start Time 1430    PT Stop Time 1510    PT Time Calculation (min) 40 min    Activity Tolerance Patient tolerated treatment well    Behavior During Therapy [facility] for tasks assessed/performed             Past Medical History:  Diagnosis Date   Alcohol abuse    Anemia    patient denies   Bipolar 1 disorder ([place])    No medications currently   CAP (community acquired pneumonia) 03/17/2015   History of radiation therapy 08/24/19-09/22/19   Vaginal Radiation;Dr. 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   Mental disorder    Obesity    PICC line infection 05/17/2015   Sepsis due to Gram negative bacteria (MDR E Coli) 02/18/2015   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HCC)    UTI (lower urinary tract infection)    Vaginal Pap smear, abnormal     Past Surgical History:  Procedure Laterality Date   ABDOMINAL HYSTERECTOMY     CERVICAL CONIZATION W/BX N/A 07/01/2016   Procedure: CONIZATION CERVIX WITH BIOPSY;  Surgeon: Chancy Milroy, MD;  Location: [facility];  Service: Gynecology;  Laterality: N/A;   CHOLECYSTECTOMY     EXTERNAL FIXATION LEG Right 04/09/2020   Procedure: EXTERNAL FIXATION LEG;  Surgeon: Hiram Gash, MD;  Location: [facility];  Service: Orthopedics;  Laterality: Right;   HYSTEROSCOPY WITH D & C N/A 01/25/2016   Procedure: DILATATION AND CURETTAGE /HYSTEROSCOPY;  Surgeon: Mora Bellman, MD;  Location: [facility];  Service: Gynecology;  Laterality: N/A;   LYMPH NODE BIOPSY Bilateral 07/22/2019   Procedure: LYMPH NODE BIOPSY;  Surgeon: Everitt Amber, MD;  Location: [facility];  Service: Gynecology;  Laterality: Bilateral;   OPEN REDUCTION INTERNAL FIXATION (ORIF) TIBIA/FIBULA FRACTURE Right 04/10/2020   Procedure: OPEN REDUCTION INTERNAL FIXATION (ORIF) TIBIA/FIBULA FRACTURE;  Surgeon: Shona Needles, MD;  Location: [facility];  Service: Orthopedics;  Laterality: Right;   ROBOT ASSISTED MYOMECTOMY N/A 07/22/2019   Procedure: XI ROBOTIC ASSISTED LAPAROSCOPIC RADICAL UPPER VAGINECTOMY, LEFT SALPINECTOMY, RIGHT SALPINGOOOPHERECTOMY;  Surgeon: Everitt Amber, MD;  Location: [facility];  Service: Gynecology;  Laterality: N/A;   VAGINAL HYSTERECTOMY N/A 03/11/2017   Procedure: HYSTERECTOMY VAGINAL WITH MORCELLATION;  Surgeon: Chancy Milroy, MD;  Location: [facility];  Service: Gynecology;  Laterality: N/A;    There were no vitals filed for this visit.   Subjective Assessment - 04/09/21 1430     Subjective I feel good today. The last treatment helped the muscles for a little while.    Patient Stated Goals decrease pain vaginally    Currently in Pain? No/denies                            Pelvic Floor Special Questions - 04/09/21 0001  Pelvic Floor Internal Exam Patient confirms identificaiton and approves PT to assess pelvic floor assessment and treatment    Exam Type Vaginal    Strength weak squeeze, no lift   needs VC to contract just vagina and not adding other muscles and hold breath              [facility] Adult PT Treatment/Exercise - 04/09/21 0001       Self-Care   Self-Care Other Self-Care Comments    Other Self-Care Comments  discussed with patient on using moiturizers to lubricant the vaginal canal and looking for her dilators to bring in and practice      Neuro Re-ed    Neuro Re-ed Details  pelvic floor contraction with therapist finger in the canal to give  feedback on performing a correct contraction      Manual Therapy   Manual Therapy Internal Pelvic Floor    Internal Pelvic Floor gentle manual work to the vaginal canal going slowly due to the thin tissue and pain. Initiall therapist index finger from the PIP then to the MCP by the end                    PT Education - 04/09/21 1511     Education Details finding her vaginal dilator and brnging it in, gave patient samples of [employer] lube to moisturize the vaginal canal    Person(s) Educated Patient    Methods Explanation    Comprehension Verbalized understanding              PT Short Term Goals - 04/09/21 1522       PT SHORT TERM GOAL #1   Title independent with initial HEP    Time 4    Period Weeks    Status On-going               PT Long Term Goals - 03/05/21 1517       PT LONG TERM GOAL #1   Title independent with her HEP to perfrom manual work to the pelvic floor and understand vaginal health    Baseline not educated yet    Time 4    Period Months    Status New    Target Date 07/06/21      PT LONG TERM GOAL #2   Title able to use the vaginal dilator to increase the vaginal canal depth and reduce pain for vaginal exam    Baseline pain is 10/10 and not able to use the dilator at this time    Time 4    Period Months    Status New    Target Date 07/06/21      PT LONG TERM GOAL #3   Title urinary leakage decreased so she is using 1 diaper to none during the day and night    Baseline wears 2 diapers during the day and 3 at night    Time 4    Period Months    Status New    Target Date 07/06/21      PT LONG TERM GOAL #4   Title vaginal pain with sitting and walking decreased </= 1-2/10 due to improved tissue mobility and trigger points    Baseline pain level 2-10/10    Time 4    Period Months    Status New    Target Date 07/06/21      PT LONG TERM GOAL #5   Title ---    Baseline ---  Plan - 04/09/21 1517      Clinical Impression Statement Patient will be getting an interstim on 04/21/2021 for her urinary leakage. Patient is able to isolate a pelvic floor contraction 4 out of 8 times without compensation. Patient does need verbal cues to breath with pelvic floor contraction. Patient vaginal canal lengthern from therapist PIP to MCP by the end of the treatment. Patient had tenderness located at the roof of the vaginal vault and after manual work. Her vaginal canal is dry and the tissue is thin so the therapist went very slow and monitored for pain. Patient will look at home for her dilator and bring it in. Patient will benefit from skilled therapy to improve pelvic floor coordination and improve pain so she can return to using her dilator and reduce her leakage.    Personal Factors and Comorbidities Comorbidity 3+;Fitness    Comorbidities mental disorder; bipolar; vaginal cancer with radiation from 08/24/19-09/22/19; Abodminal hysterectomy 03/11/2017; Myomectomy 07/22/19    Examination-Activity Limitations Continence;Locomotion Level;Sit    Examination-Participation Restrictions Community Activity    Stability/Clinical Decision Making Evolving/Moderate complexity    Rehab Potential Good    PT Frequency 1x / week    PT Duration Other (comment)   4 months   PT Treatment/Interventions ADLs/Self Care Home Management;Biofeedback;Therapeutic activities;Therapeutic exercise;Neuromuscular re-education;Patient/family education;Manual techniques    PT Next Visit Plan manual work to pelvic floor and internall; abdominal manual work; see if she has found her dilator    PT Home Exercise Plan Access Code: [email or web address]    Consulted and Agree with Plan of Care Patient             Patient will benefit from skilled therapeutic intervention in order to improve the following deficits and impairments:  Decreased coordination, Increased fascial restricitons, Pain, Decreased strength, Decreased activity tolerance, Decreased endurance,  Increased muscle spasms  Visit Diagnosis: Muscle weakness (generalized)  Other lack of coordination  Cramp and spasm  Pelvic pain  Other urinary incontinence  Vaginal cancer ([place])     Problem List Patient Active Problem List   Diagnosis Date Noted   Acute blood loss anemia    Leukopenia    Vitamin D deficiency    Acute lower UTI    Postoperative pain    Tibia/fibula fracture, right, sequela 04/15/2020   Closed fracture of medial malleolus of right ankle 04/10/2020   Open fracture of distal end of fibula and tibia, right, type I or II, initial encounter 04/09/2020   Vaginal cancer ([place]) 07/22/2019   Morbid obesity with BMI of 40.0-44.9, adult ([place]) 06/11/2019   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[place]) 05/28/2019   Visit for routine gyn exam 04/21/2019   Vaginal atrophy 04/21/2019   Painful lumpy right breast 07/27/2016   Urinary tract infection 72/06/4708   Diastolic dysfunction 62/83/6629   Constipation 05/12/2015   ESBL (extended spectrum beta-lactamase) producing bacteria infection 03/17/2015   Diarrhea 03/03/2015   Weakness 03/02/2015   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 [place], [place], [address] Phone: [phone]   Fax:  [phone]  Name: Jordan [name] MRN: [id number] Date of Birth: [birth year]

## 2021-04-11 ENCOUNTER — Telehealth: Payer: Self-pay | Admitting: *Deleted

## 2021-04-11 NOTE — Telephone Encounter (Signed)
Lita called front desk and wanted to talk with a nurse. Call transferred to nurse. [place] reports she has been treated for vaginal cancer twice. States she is having severe menopausal symptoms of feeling very hot, then freezing, etc. She has appointment scheduled for first available for 05/02/21 . She would like to be seen sooner. I explained that is probably the first available with him specifically because he works in other locations.  I offered to see if another provider has availability, but she prefers to see only him. I explained I can not order anything for her symptoms and she will need to see him to discuss. I  explained I will send a message to registrar to schedule sooner if available. I explained I will send this message to Dr. Rip Harbour, and if he has any advice for her before her appointment he or one of [name] will let her know. I also encouraged her to call office when she can to see if there is any cancellations for Dr. Rip Harbour.  She voices understanding.  Monte Zinni,RN

## 2021-04-16 ENCOUNTER — Telehealth: Payer: Self-pay | Admitting: Oncology

## 2021-04-16 NOTE — Telephone Encounter (Signed)
Jordan [name] called and said she needs an appointment to pick up a dilator from [facility].  She needs an appointment to use the transportation system.  Apt scheduled for tomorrow at 11:30.

## 2021-04-17 ENCOUNTER — Telehealth: Payer: Self-pay | Admitting: Oncology

## 2021-04-17 ENCOUNTER — Ambulatory Visit
Admission: RE | Admit: 2021-04-17 | Discharge: 2021-04-17 | Disposition: A | Discharge status: Home / Self Care | Payer: Medicaid Other | Source: Ambulatory Visit | Attending: Radiation Oncology | Admitting: Radiation Oncology

## 2021-04-17 ENCOUNTER — Other Ambulatory Visit: Payer: Self-pay

## 2021-04-17 ENCOUNTER — Ambulatory Visit: Payer: Medicaid Other

## 2021-04-17 DIAGNOSIS — C52 Malignant neoplasm of vagina: Secondary | ICD-10-CM

## 2021-04-17 NOTE — Progress Notes (Signed)
Vaginal dilators and lubricant provided to patient. Education provided. Advised patient to take dilators to pelvic floor physical therapy appointment.    Home Care Instructions for the Insertion and Care of Your Vaginal Dilator  Why Do I Need a Vaginal Dilator?  Internal radiation therapy may cause scar tissue to form at the top of your vagina (vaginal cuff).  This may make vaginal examinations difficult in the future. You can prevent scar tissue from forming by using a vaginal dilator (a smooth plastic rod), and/or by having regular sexual intercourse.  If not using the dilator you should be having intercourse two or three times a week.  If you are unable to have intercourse, you should use your vaginal dilator.  You may have some spotting or bleeding from your dilator or intercourse the first few times. You may also have some discomfort. If discomfort occurs with intercourse, you and your partner may need to stop for a while and try again later.  How to Use Your Vaginal Dilator  Wash the dilator with soap and water before and after each use. Check the dilator to be sure it is smooth. Do not use the dilator if you find any roughspots. Coat the dilator with K-Y Jelly, Astroglide, or Replens. Do not use Vaseline, baby oil, or other oil based lubricants. They are not water-soluble and can be irritating to the tissues in the vagina. Lie on your back with your knees bent and legs apart. Insert the rounded end of the dilator into your vagina as far as it will go without causing pain or discomfort. Close your knees and slowly straighten your legs. Keep the dilator in your vagina for about 10 to 15 minutes.  Use three times a week (for example: Monday, Wednesday, and Friday) [place] your knees, open your legs, and gently remove the dilator. Gently cleanse the skin around the vaginal opening. Wash the dilator after each use.  It is important that you use the dilator routinely until instructed  otherwise by your doctor.

## 2021-04-17 NOTE — Telephone Encounter (Signed)
Jordan [name] called and asked to move her appointment today to 12:30.  Apt rescheduled and transportation was notified.

## 2021-04-17 NOTE — Patient Instructions (Signed)
  Home Care Instructions for the Insertion and Care of Your Vaginal Dilator  Why Do I Need a Vaginal Dilator?  Internal radiation therapy may cause scar tissue to form at the top of your vagina (vaginal cuff).  This may make vaginal examinations difficult in the future. You can prevent scar tissue from forming by using a vaginal dilator (a smooth plastic rod), and/or by having regular sexual intercourse.  If not using the dilator you should be having intercourse two or three times a week.  If you are unable to have intercourse, you should use your vaginal dilator.  You may have some spotting or bleeding from your dilator or intercourse the first few times. You may also have some discomfort. If discomfort occurs with intercourse, you and your partner may need to stop for a while and try again later.  How to Use Your Vaginal Dilator  Wash the dilator with soap and water before and after each use. Check the dilator to be sure it is smooth. Do not use the dilator if you find any roughspots. Coat the dilator with K-Y Jelly, Astroglide, or Replens. Do not use Vaseline, baby oil, or other oil based lubricants. They are not water-soluble and can be irritating to the tissues in the vagina. Lie on your back with your knees bent and legs apart. Insert the rounded end of the dilator into your vagina as far as it will go without causing pain or discomfort. Close your knees and slowly straighten your legs. Keep the dilator in your vagina for about 10 to 15 minutes.  Use three times a week (for example: Monday, Wednesday, and Friday) Bend your knees, open your legs, and gently remove the dilator. Gently cleanse the skin around the vaginal opening. Wash the dilator after each use.  It is important that you use the dilator routinely until instructed otherwise by your doctor.    

## 2021-04-18 ENCOUNTER — Encounter: Payer: Self-pay | Admitting: Physical Therapy

## 2021-04-18 ENCOUNTER — Ambulatory Visit: Payer: Medicaid Other | Admitting: Physical Therapy

## 2021-04-18 DIAGNOSIS — M6281 Muscle weakness (generalized): Secondary | ICD-10-CM | POA: Diagnosis not present

## 2021-04-18 DIAGNOSIS — R102 Pelvic and perineal pain unspecified side: Secondary | ICD-10-CM

## 2021-04-18 DIAGNOSIS — R252 Cramp and spasm: Secondary | ICD-10-CM

## 2021-04-18 DIAGNOSIS — R278 Other lack of coordination: Secondary | ICD-10-CM

## 2021-04-18 DIAGNOSIS — N39498 Other specified urinary incontinence: Secondary | ICD-10-CM

## 2021-04-18 DIAGNOSIS — C52 Malignant neoplasm of vagina: Secondary | ICD-10-CM

## 2021-04-18 NOTE — Patient Instructions (Signed)
PROTOCOL FOR VAGINAL DILATORS   Wash dilator with soap and water prior to insertion.    Lay on your back reclined. Knees are to be up and apart while on your bed or in the bathtub with warm water.   Lubricate the end of the dilator with a water-soluble lubricant.  Separate the labia.   Tense the pelvic floor muscles than relax; while relaxing, slide lubricated dilator( round side of dilator) into the vagina.  Insert toward the direction of your spine. Dilator should feel snug and no pain more than 3/10.   Tense muscles again while holding the dilator so it does not get pushed out; relax and slide it in a little further.   Try blowing out as if filling a balloon; this may relax the muscles and allow penetration.  Repeat blowing out to insert dilator further.  Keep dilator in for 10 minutes if tolerate, with the pelvic floor muscles relaxed to further stretch the canal.   Never force the dilator into the canal. Once the dilator is comfortable start to move in and out, side to side, move your hips in different directions  3-4 times per week Progression of dilator.  When you are able to place dilator into vaginal canal and feel no pain or able to move without difficulty you are ready for the next size.  Before you go to the next size start with the original size for 2 minutes then use the next size up for 5 minutes. When the next size up is easy to use, do not have to start with the smaller size.   [facility] [address], [place], [place] [address] Phone # [phone] Fax [phone]

## 2021-04-18 NOTE — Therapy (Signed)
[facility] [address], [place], [place], [address] Phone: [phone]   Fax:  [phone]  Physical Therapy Treatment  Patient Details  Name: Jordan [name] MRN: [id number] Date of Birth: 1965/12/07 Referring Provider (PT): Joylene John, NP   Encounter Date: 04/18/2021   PT End of Session - 04/18/21 1705     Visit Number 4    Date for PT Re-Evaluation 07/06/21    Authorization Type MCD    Authorization Time Period 6/17-7/7    Authorization - Visit Number 3    Authorization - Number of Visits 3    PT Start Time 9485    PT Stop Time 1655    PT Time Calculation (min) 40 min    Activity Tolerance Patient tolerated treatment well    Behavior During Therapy [facility] for tasks assessed/performed             Past Medical History:  Diagnosis Date   Alcohol abuse    Anemia    patient denies   Bipolar 1 disorder ([place])    No medications currently   CAP (community acquired pneumonia) 03/17/2015   History of radiation therapy 08/24/19-09/22/19   Vaginal Radiation;Dr. 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   Mental disorder    Obesity    PICC line infection 05/17/2015   Sepsis due to Gram negative bacteria (MDR E Coli) 02/18/2015   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HCC)    UTI (lower urinary tract infection)    Vaginal Pap smear, abnormal     Past Surgical History:  Procedure Laterality Date   ABDOMINAL HYSTERECTOMY     CERVICAL CONIZATION W/BX N/A 07/01/2016   Procedure: CONIZATION CERVIX WITH BIOPSY;  Surgeon: Chancy Milroy, MD;  Location: [facility];  Service: Gynecology;  Laterality: N/A;   CHOLECYSTECTOMY     EXTERNAL FIXATION LEG Right 04/09/2020   Procedure: EXTERNAL FIXATION LEG;  Surgeon: Hiram Gash, MD;  Location: [facility];  Service: Orthopedics;  Laterality: Right;   HYSTEROSCOPY WITH D & C N/A 01/25/2016   Procedure: DILATATION AND CURETTAGE /HYSTEROSCOPY;  Surgeon: Mora Bellman, MD;  Location: [facility];  Service: Gynecology;  Laterality: N/A;   LYMPH NODE BIOPSY Bilateral 07/22/2019   Procedure: LYMPH NODE BIOPSY;  Surgeon: Everitt Amber, MD;  Location: [facility];  Service: Gynecology;  Laterality: Bilateral;   OPEN REDUCTION INTERNAL FIXATION (ORIF) TIBIA/FIBULA FRACTURE Right 04/10/2020   Procedure: OPEN REDUCTION INTERNAL FIXATION (ORIF) TIBIA/FIBULA FRACTURE;  Surgeon: Shona Needles, MD;  Location: [facility];  Service: Orthopedics;  Laterality: Right;   ROBOT ASSISTED MYOMECTOMY N/A 07/22/2019   Procedure: XI ROBOTIC ASSISTED LAPAROSCOPIC RADICAL UPPER VAGINECTOMY, LEFT SALPINECTOMY, RIGHT SALPINGOOOPHERECTOMY;  Surgeon: Everitt Amber, MD;  Location: [facility];  Service: Gynecology;  Laterality: N/A;   VAGINAL HYSTERECTOMY N/A 03/11/2017   Procedure: HYSTERECTOMY VAGINAL WITH MORCELLATION;  Surgeon: Chancy Milroy, MD;  Location: [facility];  Service: Gynecology;  Laterality: N/A;    There were no vitals filed for this visit.   Subjective Assessment - 04/18/21 1619     Subjective I got the small dilators.    Patient Stated Goals decrease pain vaginally    Currently in Pain? No/denies                [facility] PT Assessment - 04/18/21 0001       Assessment   Medical Diagnosis C52 squamous cell carcinoma of vagina; R10.2 Vaginal pain  Referring Provider (PT) Joylene John, NP    Onset Date/Surgical Date 08/24/19    Prior Therapy not for pelvic pain      Precautions   Precautions Other (comment)    Precaution Comments cancer with radiation      Restrictions   Weight Bearing Restrictions No      [place] residence      Prior Function   Level of Independence Independent    Vocation On disability      Cognition   Overall Cognitive Status Within Functional Limits for tasks assessed      Observation/Other Assessments   Focus on Therapeutic Outcomes (FOTO)  No FOTO - MCD      PROM   Overall PROM Comments full  bilateral hip ROM      Palpation   Palpation comment tenderness in the lower abdominal, pubic bone, ischiocavernosus                        Pelvic Floor Special Questions - 04/18/21 0001     Currently Sexually Active No    Urinary Leakage Yes    Pad use diaper 2; night 3    Activities that cause leaking With strong urge;Coughing;Walking;Other    Other activities that cause leaking just happens without knowing it    Urinary urgency Yes    Urinary frequency none    Fluid intake reduces fluid due to leakage    Skin Integrity Intact   dryness   Strength weak squeeze, no lift   needs VC to contract just vagina and not adding other muscles and hold breath              [facility] Adult PT Treatment/Exercise - 04/18/21 0001       Self-Care   Self-Care Other Self-Care Comments    Other Self-Care Comments  educating patient on how to use the smallest dilator into the vaginal canal to elongate. She is using a towel to help keep the dilator into the canal and not to have more than 3/10 pain. Educated patient on what type of soap she is to use on the vulvar area and labia area to not irritate the skin.      Manual Therapy   Manual Therapy Internal Pelvic Floor    Internal Pelvic Floor manual work to the introitus, post. vaginal canal and on the side to improve widening of the canal and going slowly while monitoring for pain. educated patient on the labia, vulva, and the vaginal canal                    PT Education - 04/18/21 1704     Education Details education on using a dilator in the vaginal canal    Person(s) Educated Patient    Methods Explanation;Demonstration;Handout    Comprehension Verbalized understanding;Returned demonstration              PT Short Term Goals - 04/18/21 1711       PT SHORT TERM GOAL #1   Title independent with initial HEP    Time 4    Status Achieved               PT Long Term Goals - 04/18/21 1712       PT LONG  TERM GOAL #1   Title independent with her HEP to perfrom manual work to the pelvic floor and understand vaginal health    Baseline  continues to be educated as she progresses    Time 4    Period Months    Status On-going      PT LONG TERM GOAL #2   Title able to use the vaginal dilator to increase the vaginal canal depth and reduce pain for vaginal exam    Baseline just learned how to use her dilator and will need further instruction    Time 4    Period Months    Status On-going      PT LONG TERM GOAL #3   Title urinary leakage decreased so she is using 1 diaper to none during the day and night    Baseline will be having and interstim unit placed on 7/28 to reduce her urinary leakage    Time 4    Period Months    Status Deferred      PT LONG TERM GOAL #4   Title vaginal pain with sitting and walking decreased </= 1-2/10 due to improved tissue mobility and trigger points    Baseline pain level 2-10/10    Time 4    Period Months    Status On-going                   Plan - 04/18/21 1706     Clinical Impression Statement Patient was educated on how to use her dilator by inserting into the vaginal canal and keeping it in for 15 mintues. She was educated on the labia and vulvar structures to understand her anatomy. Patient was only able to place the vaginal dilator in 1 inche due to pain increased to 8/10 and she is not to have more pain than 3/10. Patient will have increased achiness in the pelvic region while she is using the dilator. Patient understands the soap she is to use on the vaginal area. Pelvic floor strength is 2/5. She does leak urine and will be getting an interstim to reduce the leakage in the next month. Patient will benefit from skilled therapy to improved pelvic floor coordination and improve pain so she can return to using her dilator and reduce her leakage.    Personal Factors and Comorbidities Comorbidity 3+;Fitness    Comorbidities mental disorder; bipolar;  vaginal cancer with radiation from 08/24/19-09/22/19; Abodminal hysterectomy 03/11/2017; Myomectomy 07/22/19    Examination-Activity Limitations Continence;Locomotion Level;Sit    Examination-Participation Restrictions Community Activity    Stability/Clinical Decision Making Evolving/Moderate complexity    Rehab Potential Good    PT Frequency 1x / week    PT Duration Other (comment)   4 months   PT Treatment/Interventions ADLs/Self Care Home Management;Biofeedback;Therapeutic activities;Therapeutic exercise;Neuromuscular re-education;Patient/family education;Manual techniques    PT Next Visit Plan manual work to pelvic floor and internall; abdominal manual work;    PT Home Exercise Plan Access Code: [email or web address]    Consulted and Agree with Plan of Care Patient             Patient will benefit from skilled therapeutic intervention in order to improve the following deficits and impairments:  Decreased coordination, Increased fascial restricitons, Pain, Decreased strength, Decreased activity tolerance, Decreased endurance, Increased muscle spasms  Visit Diagnosis: Muscle weakness (generalized)  Other lack of coordination  Cramp and spasm  Pelvic pain  Other urinary incontinence  Vaginal cancer [facility])     Problem List Patient Active Problem List   Diagnosis Date Noted   Acute blood loss anemia    Leukopenia    Vitamin D deficiency    Acute lower UTI  Postoperative pain    Tibia/fibula fracture, right, sequela 04/15/2020   Closed fracture of medial malleolus of right ankle 04/10/2020   Open fracture of distal end of fibula and tibia, right, type I or II, initial encounter 04/09/2020   Vaginal cancer ([place]) 07/22/2019   Morbid obesity with BMI of 40.0-44.9, adult ([place]) 06/11/2019   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[place]) 05/28/2019   Visit for routine gyn exam 04/21/2019   Vaginal atrophy 04/21/2019   Painful lumpy right breast 07/27/2016   Urinary tract infection 80/88/1103    Diastolic dysfunction 15/94/5859   Constipation 05/12/2015   ESBL (extended spectrum beta-lactamase) producing bacteria infection 03/17/2015   Diarrhea 03/03/2015   Weakness 03/02/2015   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, [place], [place], [address] Phone: [phone]   Fax:  [phone]  Name: Jordan [name] MRN: [id number] Date of Birth: Jan 01, 1966

## 2021-04-26 ENCOUNTER — Encounter: Payer: Self-pay | Admitting: General Practice

## 2021-04-26 NOTE — Progress Notes (Signed)
[facility] Spiritual Care Note  Received and returned check-in call from [place], but at an inconvenient time. She plans to return call later.   [place], [place], [facility] Pager [phone] Voicemail [phone]

## 2021-04-30 ENCOUNTER — Ambulatory Visit: Payer: Medicaid Other | Admitting: Physical Therapy

## 2021-04-30 ENCOUNTER — Telehealth: Payer: Self-pay | Admitting: Radiology

## 2021-04-30 ENCOUNTER — Ambulatory Visit: Payer: Medicaid Other | Admitting: Obstetrics and Gynecology

## 2021-04-30 ENCOUNTER — Telehealth: Payer: Self-pay | Admitting: Physical Therapy

## 2021-04-30 NOTE — Telephone Encounter (Signed)
Patient reports severe vaginal pain x 2 weeks. She has been using the vaginal dilators 3 times weekly. She states the pain started before using the dilators. She asks if there is a medication that could help. She has an appointment this week with Dr Rip Harbour. Please advise.

## 2021-04-30 NOTE — Telephone Encounter (Signed)
Spoke to patient on the phone. She is having increased pelvic pain with the dilator. She reports the pain medication is not helping. We went over the doctors orders to sit on a frozen bag of peas to reduce the swelling and pain if the pelvic area. Therapist explained to patient she is not to use the dilator due to the pain and bring in with her next appointment to review how to use the dilator. The therapist has a cream with lidocaine she can try for pain relief at her next visit.  Earlie Counts, PT @7 /08/2021@ 3:29 PM

## 2021-05-02 ENCOUNTER — Ambulatory Visit (INDEPENDENT_AMBULATORY_CARE_PROVIDER_SITE_OTHER): Payer: Medicaid Other | Admitting: Obstetrics and Gynecology

## 2021-05-02 ENCOUNTER — Telehealth: Payer: Self-pay | Admitting: *Deleted

## 2021-05-02 ENCOUNTER — Other Ambulatory Visit: Payer: Self-pay

## 2021-05-02 ENCOUNTER — Encounter: Payer: Self-pay | Admitting: Obstetrics and Gynecology

## 2021-05-02 DIAGNOSIS — N951 Menopausal and female climacteric states: Secondary | ICD-10-CM

## 2021-05-02 MED ORDER — ESTROGENS CONJUGATED 0.3 MG PO TABS: 0.3000 mg | ORAL_TABLET | Freq: Every day | ORAL | 5 refills | Status: DC

## 2021-05-02 NOTE — Patient Instructions (Signed)
https://www.womenshealth.gov/menopause/menopause-basics"> https://www.clinicalkey.com">  Menopause Menopause is the normal time of a woman's life when menstrual periods stop completely. It marks the natural end to a woman's ability to become pregnant. It can be defined as the absence of a menstrual period for 12 months without another medical cause. The transition to menopause (perimenopause) most often happens between the ages of 45 and 55, and can last for many years. During perimenopause, hormone levels change in your body, which can cause symptoms and affect your health. Menopause may increase your risk for: Weakened bones (osteoporosis), which causes fractures. Depression. Hardening and narrowing of the arteries (atherosclerosis), which can cause heart attacks and strokes. What are the causes? This condition is usually caused by a natural change in hormone levels that happens as you get older. The condition may also be caused by changes that are not natural, including: Surgery to remove both ovaries (surgical menopause). Side effects from some medicines, such as chemotherapy used to treat cancer (chemical menopause). What increases the risk? This condition is more likely to start at an earlier age if you have certain medical conditions or have undergone treatments, including: A tumor of the pituitary gland in the brain. A disease that affects the ovaries and hormones. Certain cancer treatments, such as chemotherapy or hormone therapy, or radiation therapy on the pelvis. Heavy smoking and excessive alcohol use. Family history of early menopause. This condition is also more likely to develop earlier in women who are verythin. What are the signs or symptoms? Symptoms of this condition include: Hot flashes. Irregular menstrual periods. Night sweats. Changes in feelings about sex. This could be a decrease in sex drive or an increased discomfort around your sexuality. Vaginal dryness and  thinning of the vaginal walls. This may cause painful sex. Dryness of the skin and development of wrinkles. Headaches. Problems sleeping (insomnia). Mood swings or irritability. Memory problems. Weight gain. Hair growth on the face and chest. Bladder infections or problems with urinating. How is this diagnosed? This condition is diagnosed based on your medical history, a physical exam, your age, your menstrual history, and your symptoms. Hormone tests may also bedone. How is this treated? In some cases, no treatment is needed. You and your health care provider should make a decision together about whether treatment is necessary. Treatment will be based on your individual condition and preferences. Treatment for this condition focuses on managing symptoms. Treatment may include: Menopausal hormone therapy (MHT). Medicines to treat specific symptoms or complications. Acupuncture. Vitamin or herbal supplements. Before starting treatment, make sure to let your health care provider know if you have a personal or family history of these conditions: Heart disease. Breast cancer. Blood clots. Diabetes. Osteoporosis. Follow these instructions at home: Lifestyle Do not use any products that contain nicotine or tobacco, such as cigarettes, e-cigarettes, and chewing tobacco. If you need help quitting, ask your health care provider. Get at least 30 minutes of physical activity on 5 or more days each week. Avoid alcoholic and caffeinated beverages, as well as spicy foods. This may help prevent hot flashes. Get 7-8 hours of sleep each night. If you have hot flashes, try: Dressing in layers. Avoiding things that may trigger hot flashes, such as spicy food, warm places, or stress. Taking slow, deep breaths when a hot flash starts. Keeping a fan in your home and office. Find ways to manage stress, such as deep breathing, meditation, or journaling. Consider going to group therapy with other women who  are having menopause symptoms. Ask your   health care provider about recommended group therapy meetings. Eating and drinking  Eat a healthy, balanced diet that contains whole grains, lean protein, low-fat dairy, and plenty of fruits and vegetables. Your health care provider may recommend adding more soy to your diet. Foods that contain soy include tofu, tempeh, and soy milk. Eat plenty of foods that contain calcium and vitamin D for bone health. Items that are rich in calcium include low-fat milk, yogurt, beans, almonds, sardines, broccoli, and kale.  Medicines Take over-the-counter and prescription medicines only as told by your health care provider. Talk with your health care provider before starting any herbal supplements. If prescribed, take vitamins and supplements as told by your health care provider. General instructions  Keep track of your menstrual periods, including: When they occur. How heavy they are and how long they last. How much time passes between periods. Keep track of your symptoms, noting when they start, how often you have them, and how long they last. Use vaginal lubricants or moisturizers to help with vaginal dryness and improve comfort during sex. Keep all follow-up visits. This is important. This includes any group therapy or counseling.  Contact a health care provider if: You are still having menstrual periods after age 55. You have pain during sex. You have not had a period for 12 months and you develop vaginal bleeding. Get help right away if you have: Severe depression. Excessive vaginal bleeding. Pain when you urinate. A fast or irregular heartbeat (palpitations). Severe headaches. Abdominal pain or severe indigestion. Summary Menopause is a normal time of life when menstrual periods stop completely. It is usually defined as the absence of a menstrual period for 12 months without another medical cause. The transition to menopause (perimenopause) most often  happens between the ages of 45 and 55 and can last for several years. Symptoms can be managed through medicines, lifestyle changes, and complementary therapies such as acupuncture. Eat a balanced diet that is rich in nutrients to promote bone health and heart health and to manage symptoms during menopause. This information is not intended to replace advice given to you by your health care provider. Make sure you discuss any questions you have with your healthcare provider. Document Revised: 07/07/2020 Document Reviewed: 03/23/2020 Elsevier Patient Education  2022 Elsevier Inc.  

## 2021-05-02 NOTE — Telephone Encounter (Signed)
CALLED PATIENT TO INFORM OF FU ON 05-07-21 @ 4 PM, LVM FOR A RETURN CALL

## 2021-05-02 NOTE — Progress Notes (Signed)
Jordan [name] presents with c/o menopasual Sx. Hot flashes, night sweats and mood swings. H/O Stage 1 vaginal cancer. S/p resection and radiation, followed by Gyn Onc Not sexual active H/O vaginal hyst Pap 03/2021 nl  PE AF VSS Lungs clear Heart RRR Abd soft + BS  A/P Menopausal Sx.  Discussed Tx options. Pt has had severe reaction to Celexa. So hesitate to try Effexor. Discussed ERT, R/B discussed. Pt states Sx are affecting her  life and desires to try. Will start Premarin 0.3 mg qd. F/U in 3 months

## 2021-05-04 ENCOUNTER — Ambulatory Visit
Admission: EM | Admit: 2021-05-04 | Discharge: 2021-05-04 | Disposition: A | Discharge status: Home / Self Care | Payer: Medicaid Other

## 2021-05-04 ENCOUNTER — Other Ambulatory Visit: Payer: Self-pay

## 2021-05-04 DIAGNOSIS — G894 Chronic pain syndrome: Secondary | ICD-10-CM | POA: Diagnosis not present

## 2021-05-04 DIAGNOSIS — K645 Perianal venous thrombosis: Secondary | ICD-10-CM | POA: Diagnosis not present

## 2021-05-04 DIAGNOSIS — K6289 Other specified diseases of anus and rectum: Secondary | ICD-10-CM

## 2021-05-04 DIAGNOSIS — F1190 Opioid use, unspecified, uncomplicated: Secondary | ICD-10-CM | POA: Diagnosis not present

## 2021-05-04 MED ORDER — DOCUSATE SODIUM 100 MG PO CAPS: 100.0000 mg | ORAL_CAPSULE | Freq: Two times a day (BID) | ORAL | 0 refills | Status: DC

## 2021-05-04 NOTE — Discharge Instructions (Addendum)
Please change her gauze roll dressing over the anal area 3-5 times a day.  This area can bleed.  Start docusate as a stool softener twice daily.  I sent a prescription to your pharmacy electronically.

## 2021-05-04 NOTE — ED Triage Notes (Signed)
Pt c/o a bump in the crease of her buttocks area today. Denies drainage.

## 2021-05-04 NOTE — ED Provider Notes (Signed)
[name]   MRN: [id number] DOB: 08-30-1966  Subjective:   Jordan [name] is a 55 y.o. female presenting for acute onset today of a very painful tender swollen area about her buttock.  She has chronic pain syndrome, uses oxycodone daily.  Has trouble with constipation as a result.  No current facility-administered medications for this encounter.  Current Outpatient Medications:    Oxycodone HCl 10 MG TABS, Take 15 mg by mouth., Disp: , Rfl:    baclofen (LIORESAL) 10 MG tablet, Take 10 mg by mouth 3 (three) times daily as needed., Disp: , Rfl:    Baclofen 5 MG TABS, Take 1 tablet by mouth 3 (three) times daily as needed., Disp: , Rfl:    desvenlafaxine (PRISTIQ) 50 MG 24 hr tablet, Take by mouth., Disp: , Rfl:    estrogens, conjugated, (PREMARIN) 0.3 MG tablet, Take 1 tablet (0.3 mg total) by mouth daily. Take daily for 21 days then do not take for 7 days., Disp: 30 tablet, Rfl: 5   Ferrous Sulfate (IRON) 325 (65 Fe) MG TABS, Take 1 tablet by mouth every other day., Disp: , Rfl:    ibuprofen (ADVIL) 600 MG tablet, Take 1 tablet (600 mg total) by mouth every 6 (six) hours as needed., Disp: 30 tablet, Rfl: 0   oxybutynin (DITROPAN XL) 15 MG 24 hr tablet, Take 15 mg by mouth 2 (two) times daily., Disp: , Rfl:    Vitamin D, Ergocalciferol, (DRISDOL) 1.25 MG (50000 UNIT) CAPS capsule, Take 50,000 Units by mouth. Every 14 days, Disp: , Rfl:    Allergies  Allergen Reactions   Ciprofloxacin Swelling    Lips swell, tongue swells, face swells   Citalopram Other (See Comments)    Possible cause of pancytopenia. Swelling of tongue, face and throat   Lamotrigine Other (See Comments)    Possible cause of pancytopenia. Swelling of face, throat and tongue   Sulfa Antibiotics Anaphylaxis   Tramadol Anaphylaxis, Shortness Of Breath and Swelling    Past Medical History:  Diagnosis Date   Alcohol abuse    Anemia    patient denies   Bipolar 1 disorder ([place])    No medications  currently   CAP (community acquired pneumonia) 03/17/2015   History of radiation therapy 08/24/19-09/22/19   Vaginal brachytherapy   Dr. 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   Mental disorder    Obesity    PICC line infection 05/17/2015   Sepsis due to Gram negative bacteria (MDR E Coli) 02/18/2015   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HCC)    UTI (lower urinary tract infection)    Vaginal Pap smear, abnormal      Past Surgical History:  Procedure Laterality Date   ABDOMINAL HYSTERECTOMY     CERVICAL CONIZATION W/BX N/A 07/01/2016   Procedure: CONIZATION CERVIX WITH BIOPSY;  Surgeon: Chancy Milroy, MD;  Location: [facility];  Service: Gynecology;  Laterality: N/A;   CHOLECYSTECTOMY     EXTERNAL FIXATION LEG Right 04/09/2020   Procedure: EXTERNAL FIXATION LEG;  Surgeon: Hiram Gash, MD;  Location: [facility];  Service: Orthopedics;  Laterality: Right;   HYSTEROSCOPY WITH D & C N/A 01/25/2016   Procedure: DILATATION AND CURETTAGE /HYSTEROSCOPY;  Surgeon: Mora Bellman, MD;  Location: [facility];  Service: Gynecology;  Laterality: N/A;   LYMPH NODE BIOPSY Bilateral 07/22/2019   Procedure: LYMPH NODE BIOPSY;  Surgeon: Everitt Amber, MD;  [facility];  Service: Gynecology;  Laterality: Bilateral;  OPEN REDUCTION INTERNAL FIXATION (ORIF) TIBIA/FIBULA FRACTURE Right 04/10/2020   Procedure: OPEN REDUCTION INTERNAL FIXATION (ORIF) TIBIA/FIBULA FRACTURE;  Surgeon: Shona Needles, MD;  Location: [facility];  Service: Orthopedics;  Laterality: Right;   ROBOT ASSISTED MYOMECTOMY N/A 07/22/2019   Procedure: XI ROBOTIC ASSISTED LAPAROSCOPIC RADICAL UPPER VAGINECTOMY, LEFT SALPINECTOMY, RIGHT SALPINGOOOPHERECTOMY;  Surgeon: Everitt Amber, MD;  [facility];  Service: Gynecology;  Laterality: N/A;   VAGINAL HYSTERECTOMY N/A 03/11/2017   Procedure: HYSTERECTOMY VAGINAL WITH MORCELLATION;  Surgeon: Chancy Milroy, MD;  Location: [facility];  Service: Gynecology;  Laterality: N/A;     Family History  Problem Relation Age of Onset   Alcohol abuse Father    Cancer Father    Breast cancer Neg Hx     Social History   Tobacco Use   Smoking status: Never   Smokeless tobacco: Never  Vaping Use   Vaping Use: Never used  Substance Use Topics   Alcohol use: Not Currently   Drug use: No    ROS   Objective:   Vitals: BP 129/78 (BP Location: Left Arm)   Pulse 92   Temp 98.1 F (36.7 C) (Oral)   Resp 18   LMP  (LMP Unknown)   SpO2 96%   Physical Exam Constitutional:      General: She is not in acute distress.    Appearance: Normal appearance. She is well-developed. She is obese. She is not ill-appearing.  HENT:     Head: Normocephalic and atraumatic.     Nose: Nose normal.     Mouth/Throat:     Mouth: Mucous membranes are moist.     Pharynx: Oropharynx is clear.  Eyes:     General: No scleral icterus.    Extraocular Movements: Extraocular movements intact.     Pupils: Pupils are equal, round, and reactive to light.  Cardiovascular:     Rate and Rhythm: Normal rate.  Pulmonary:     Effort: Pulmonary effort is normal.  Genitourinary:   Skin:    General: Skin is warm and dry.  Neurological:     General: No focal deficit present.     Mental Status: She is alert and oriented to person, place, and time.  Psychiatric:        Mood and Affect: Mood normal.        Behavior: Behavior normal.    Applied local topical anaesthetic. Alcohol prep pad and iodine used. Lanced the hemorrhoid using an 11 blade.  Hemostasis achieved with pressure dressing.  Assessment and Plan :   PDMP not reviewed this encounter.  1. Thrombosed external hemorrhoid   2. Perianal pain   3. Chronic pain syndrome   4. Opioid use     Counseled patient on need to discuss her medication regimen with her chronic pain doctor as I suspect this is the reason she is having hemorrhoids.  We did perform a successful hemorrhoidectomy today.  Counseled on wound care.  Start  docusate. Counseled patient on potential for adverse effects with medications prescribed/recommended today, ER and return-to-clinic precautions discussed, patient verbalized understanding.    Jaynee Eagles, PA-C 05/04/21 1747

## 2021-05-06 NOTE — Progress Notes (Signed)
Radiation Oncology         [phone] ________________________________  Name: Ryver Zadrozny MRN: [id number]  Date: 05/07/2021  DOB: 04/20/1966  Follow-Up Visit Note  CC: [name], NP  [name], NP    ICD-10-CM   1. 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HCC)  C52       Diagnosis:  FIGO Stage I (pT1a, pN0) Squamous Cell Carcinoma of the Vagina  Interval Since Last Radiation:  1 year 7 months, 2 weeks and 2 days   Radiation Treatment Dates: 08/24/2019 through 09/22/2019 Site Technique Total Dose (Gy) Dose per Fx (Gy) Completed Fx Beam Energies  Pelvis: Pelvis_vagina HDR-brachy 30/30 6 5/5 Ir-192   Narrative:  The patient returns today for follow-up earlier than expected.  Patient reports having severe vaginal pain.  She denies any bleeding or hematuria or rectal bleeding.  Recent imaging includes an MRI of the Pelvis on 01/01/21 which showed no evidence of local tumor recurrence at the vaginal resectioning margin, and no metastatic disease in the pelvis. 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  Since the patient was last seen, the patient presented to the ED on 01/24/21 with vaginal burning and urinary symptoms (mostly chronic). Her pelvic exam done during the visit was unremarkable, although her urine did show some signs of infection. She was accordingly started on antibiotics . The patient did report having some dizziness which was no specific. Her labs were non-concerning and she was discharged home in good condition.              The patient additionally presented to the ED on 05/04/21 with acute onset of a very painful tender swollen area about her buttocks. She reports having constipation troubles as a result.      Otherwise, the patients most recent follow up was with [name] on 03/26/21 in which the patient presented with no symptoms concerning for recurrence.   The patient  was recommended for pelvic floor exercises/rehab and agreed to this referral.  The patient has been undergoing physical therapy and reports this being helpful.  Patient reports to me that approximately 3 to 4 weeks ago she began having significant vaginal pain.  She denies any vaginal bleeding or discharge.  She reports significant pain with urination.  Her situation is complex in that she has severe urge urinary incontinence.  She has been seeing Dr. Amalia Hailey in urology at [facility].  The patient has failed medical therapy and will have placement of leads later this month for what sounds like neuromodulation. With the  Patient's medical history and weight issues Botox therapy would be too risky.  Allergies:  is allergic to ciprofloxacin, citalopram, lamotrigine, sulfa antibiotics, and tramadol.  Meds: Current Outpatient Medications  Medication Sig Dispense Refill   baclofen (LIORESAL) 10 MG tablet Take 10 mg by mouth 3 (three) times daily as needed.     Baclofen 5 MG TABS Take 1 tablet by mouth 3 (three) times daily as needed.     desvenlafaxine (PRISTIQ) 50 MG 24 hr tablet Take by mouth.     docusate sodium (COLACE) 100 MG capsule Take 1 capsule (100 mg total) by mouth every 12 (twelve) hours. 60 capsule 0   estrogens, conjugated, (PREMARIN) 0.3 MG tablet Take 1 tablet (0.3 mg total) by mouth daily. Take daily for 21 days then do not take for 7 days. 30 tablet 5   Ferrous  Sulfate (IRON) 325 (65 Fe) MG TABS Take 1 tablet by mouth every other day.     ibuprofen (ADVIL) 600 MG tablet Take 1 tablet (600 mg total) by mouth every 6 (six) hours as needed. 30 tablet 0   oxybutynin (DITROPAN XL) 15 MG 24 hr tablet Take 15 mg by mouth 2 (two) times daily.     Oxycodone HCl 10 MG TABS Take 15 mg by mouth.     Vitamin D, Ergocalciferol, (DRISDOL) 1.25 MG (50000 UNIT) CAPS capsule Take 50,000 Units by mouth. Every 14 days     No current facility-administered medications for this encounter.    Physical  Findings: The patient is in no acute distress. Patient is alert and oriented.  weight is 226 lb 9.6 oz (102.8 kg). Her temperature is 97.5 F (36.4 C) (abnormal). Her blood pressure is 120/44 (abnormal) and her pulse is 85. Her respiration is 18 and oxygen saturation is 100%. .  No significant changes. Lungs are clear to auscultation bilaterally. Heart has regular rate and rhythm. No palpable cervical, supraclavicular, or axillary adenopathy. Abdomen soft, non-tender, normal bowel sounds.  Pelvic examination the external genitalia were unremarkable.  The patient became quite anxious when proceeding with the pelvic examination which I feel part of her issue.  We did use 2% viscous Xylocaine to act as a lubricant during her speculum exam and digital exam.  A good view of the proximal vagina was noted.  There are no mucosal lesions noted in the vaginal vault.  On bimanual examination the upper vaginal area is intact.  No palpable masses.   Lab Findings: Lab Results  Component Value Date   WBC 3.4 (L) 01/24/2021   HGB 15.4 (H) 01/24/2021   HCT 44.4 01/24/2021   MCV 96.3 01/24/2021   PLT 150 01/24/2021    Radiographic Findings: No results found.  Impression:  FIGO Stage I (pT1a, pN0) Squamous Cell Carcinoma of the Vagina  She reports severe vaginal pain which is complicated by her history of severe urge incontinence as above.  She will undergo surgery later this month to address her severe urge incontinence issues.  This in all likelihood is a major portion of her pain/anxiety issues.  Patient reports that physical therapy is helping her and will continue as recommended.  Patient has been given samples of viscous Xylocaine to use when she places her vaginal dilator to see if this will help with her discomfort.  Plan: Patient will see [name] in September and radiation oncology and December of this year.  Have asked staff to cancel my follow-up appointment in September since the patient was seen  today.   25 minutes of total time was spent for this patient encounter, including preparation, face-to-face counseling with the patient and coordination of care, physical exam, and documentation of the encounter. ____________________________________  Blair Promise, PhD, MD   This document serves as a record of services personally performed by Gery Pray, MD. It was created on his behalf by Roney Mans, a trained medical scribe. The creation of this record is based on the scribe's personal observations and the provider's statements to them. This document has been checked and approved by the attending provider.

## 2021-05-07 ENCOUNTER — Ambulatory Visit: Payer: Medicaid Other | Attending: Gynecologic Oncology | Admitting: Physical Therapy

## 2021-05-07 ENCOUNTER — Encounter: Payer: Self-pay | Admitting: Physical Therapy

## 2021-05-07 ENCOUNTER — Ambulatory Visit
Admission: RE | Admit: 2021-05-07 | Discharge: 2021-05-07 | Disposition: A | Discharge status: Home / Self Care | Payer: Medicaid Other | Source: Ambulatory Visit | Attending: Radiation Oncology | Admitting: Radiation Oncology

## 2021-05-07 ENCOUNTER — Other Ambulatory Visit: Payer: Self-pay

## 2021-05-07 VITALS — BP 120/44 | HR 85 | Temp 97.5°F | Resp 18 | Wt 226.6 lb

## 2021-05-07 DIAGNOSIS — Z79899 Other long term (current) drug therapy: Secondary | ICD-10-CM | POA: Insufficient documentation

## 2021-05-07 DIAGNOSIS — R252 Cramp and spasm: Secondary | ICD-10-CM | POA: Insufficient documentation

## 2021-05-07 DIAGNOSIS — Z923 Personal history of irradiation: Secondary | ICD-10-CM | POA: Insufficient documentation

## 2021-05-07 DIAGNOSIS — M6281 Muscle weakness (generalized): Secondary | ICD-10-CM | POA: Diagnosis present

## 2021-05-07 DIAGNOSIS — R102 Pelvic and perineal pain: Secondary | ICD-10-CM

## 2021-05-07 DIAGNOSIS — K5900 Constipation, unspecified: Secondary | ICD-10-CM | POA: Insufficient documentation

## 2021-05-07 DIAGNOSIS — N3941 Urge incontinence: Secondary | ICD-10-CM | POA: Insufficient documentation

## 2021-05-07 DIAGNOSIS — N39498 Other specified urinary incontinence: Secondary | ICD-10-CM | POA: Insufficient documentation

## 2021-05-07 DIAGNOSIS — R278 Other lack of coordination: Secondary | ICD-10-CM | POA: Diagnosis present

## 2021-05-07 DIAGNOSIS — C52 Malignant neoplasm of vagina: Secondary | ICD-10-CM | POA: Insufficient documentation

## 2021-05-07 DIAGNOSIS — R309 Painful micturition, unspecified: Secondary | ICD-10-CM | POA: Insufficient documentation

## 2021-05-07 NOTE — Progress Notes (Signed)
Jordan [name] is here today for follow up post radiation to the pelvic.  They completed their radiation on: 09/22/2019   Does the patient complain of any of the following:  Pain:10/10 in the vagina Abdominal bloating: denies Diarrhea/Constipation: denies Nausea/Vomiting: denies Vaginal Discharge: denies Blood in Urine or Stool: denies Urinary Issues (dysuria/incomplete emptying/ incontinence/ increased frequency/urgency): burning and pain with urination, reports incontinence, incomplete bladder emptying Does patient report using vaginal dilator 2-3 times a week and/or sexually active 2-3 weeks: unable to use dilator regularly due to pain   Additional comments if applicable: none   Vitals:   05/07/21 1606  BP: (!) 120/44  Pulse: 85  Resp: 18  Temp: (!) 97.5 F (36.4 C)  SpO2: 100%

## 2021-05-07 NOTE — Therapy (Signed)
[facility] [address] [place], [place], [address] Phone: [phone]   Fax:  [phone]  Physical Therapy Treatment  Patient Details  Name: Jordan [name] MRN: [id number] Date of Birth: June 06, 1966 Referring Provider (PT): Joylene John, NP   Encounter Date: 05/07/2021   PT End of Session - 05/07/21 1513     Visit Number 5    Date for PT Re-Evaluation 07/06/21    Authorization Type MCD    Authorization Time Period 7/11-10/22    Authorization - Visit Number 1    Authorization - Number of Visits 12    PT Start Time 2458    PT Stop Time 1523    PT Time Calculation (min) 38 min    Activity Tolerance Patient tolerated treatment well    Behavior During Therapy [facility] for tasks assessed/performed             Past Medical History:  Diagnosis Date   Alcohol abuse    Anemia    patient denies   Bipolar 1 disorder ([place])    No medications currently   CAP (community acquired pneumonia) 03/17/2015   History of radiation therapy 08/24/19-09/22/19   Vaginal brachytherapy   Dr. 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   Mental disorder    Obesity    PICC line infection 05/17/2015   Sepsis due to Gram negative bacteria (MDR E Coli) 02/18/2015   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HCC)    UTI (lower urinary tract infection)    Vaginal Pap smear, abnormal     Past Surgical History:  Procedure Laterality Date   ABDOMINAL HYSTERECTOMY     CERVICAL CONIZATION W/BX N/A 07/01/2016   Procedure: CONIZATION CERVIX WITH BIOPSY;  Surgeon: Chancy Milroy, MD;  Location: [facility];  Service: Gynecology;  Laterality: N/A;   CHOLECYSTECTOMY     EXTERNAL FIXATION LEG Right 04/09/2020   Procedure: EXTERNAL FIXATION LEG;  Surgeon: Hiram Gash, MD;  Location: [facility];  Service: Orthopedics;  Laterality: Right;   HYSTEROSCOPY WITH D & C N/A 01/25/2016   Procedure: DILATATION AND CURETTAGE /HYSTEROSCOPY;   Surgeon: Mora Bellman, MD;  Location: [facility];  Service: Gynecology;  Laterality: N/A;   LYMPH NODE BIOPSY Bilateral 07/22/2019   Procedure: LYMPH NODE BIOPSY;  Surgeon: Everitt Amber, MD;  [facility];  Service: Gynecology;  Laterality: Bilateral;   OPEN REDUCTION INTERNAL FIXATION (ORIF) TIBIA/FIBULA FRACTURE Right 04/10/2020   Procedure: OPEN REDUCTION INTERNAL FIXATION (ORIF) TIBIA/FIBULA FRACTURE;  Surgeon: Shona Needles, MD;  Location: [facility];  Service: Orthopedics;  Laterality: Right;   ROBOT ASSISTED MYOMECTOMY N/A 07/22/2019   Procedure: XI ROBOTIC ASSISTED LAPAROSCOPIC RADICAL UPPER VAGINECTOMY, LEFT SALPINECTOMY, RIGHT SALPINGOOOPHERECTOMY;  Surgeon: Everitt Amber, MD;  [facility];  Service: Gynecology;  Laterality: N/A;   VAGINAL HYSTERECTOMY N/A 03/11/2017   Procedure: HYSTERECTOMY VAGINAL WITH MORCELLATION;  Surgeon: Chancy Milroy, MD;  Location: [facility];  Service: Gynecology;  Laterality: N/A;    There were no vitals filed for this visit.   Subjective Assessment - 05/07/21 1446     Subjective I had a bump and went to the urgent care and they had to lance it. I am having pain in the vaginal area.    Patient Stated Goals decrease pain vaginally    Currently in Pain? Yes    Pain Score 10-Worst pain ever    Pain Location Vagina    Pain Orientation Mid  Pain Descriptors / Indicators Burning    Pain Type Acute pain    Pain Onset More than a month ago    Pain Frequency Intermittent    Aggravating Factors  siting, taking a shower, walking    Pain Relieving Factors pain medication    Multiple Pain Sites No                               [facility] Adult PT Treatment/Exercise - 05/07/21 0001       Self-Care   Self-Care Other Self-Care Comments    Other Self-Care Comments  educated patient on using the dsert harvest reveleum daily into the vaginal canal to improve moisture in the area, not to sue the dilator for one week to improve the moisture and  reduce pain with using the dilator      Manual Therapy   Manual Therapy Internal Pelvic Floor;Soft tissue mobilization    Internal Pelvic Floor using the [employer] Har[employer] Reveluem in the vaginal canal to reduce pain with internal work, as the therapist placed her finger all the way into the canal there was more pain. therapist was able to place her whole index finger into the vaginal canal but on the eval was only to the DIP                    PT Education - 05/07/21 1526     Education Details education on using the [facility] REveleum to improve moisture in the vaginal canal and reduce pain    Person(s) Educated Patient    Methods Explanation    Comprehension Verbalized understanding              PT Short Term Goals - 04/18/21 1711       PT SHORT TERM GOAL #1   Title independent with initial HEP    Time 4    Status Achieved               PT Long Term Goals - 04/18/21 1712       PT LONG TERM GOAL #1   Title independent with her HEP to perfrom manual work to the pelvic floor and understand vaginal health    Baseline continues to be educated as she progresses    Time 4    Period Months    Status On-going      PT LONG TERM GOAL #2   Title able to use the vaginal dilator to increase the vaginal canal depth and reduce pain for vaginal exam    Baseline just learned how to use her dilator and will need further instruction    Time 4    Period Months    Status On-going      PT LONG TERM GOAL #3   Title urinary leakage decreased so she is using 1 diaper to none during the day and night    Baseline will be having and interstim unit placed on 7/28 to reduce her urinary leakage    Time 4    Period Months    Status Deferred      PT LONG TERM GOAL #4   Title vaginal pain with sitting and walking decreased </= 1-2/10 due to improved tissue mobility and trigger points    Baseline pain level 2-10/10    Time 4    Period Months    Status On-going  Plan - 05/07/21 1526     Clinical Impression Statement Patient reported she felt a pop one of the times when she was using the dilator slowly. Patient may of torn some adhesions in the vaignal canal due to the therapist able to place her finger all the way into the vaginal canal first time. Patient had 10/10 pain when therapist did this so therapist stopped perfroming internal work. Therapist worked on the external perineum while monitoring for pain. Patient continued to have burning and pain in the vaginal pain. She will be seeing the doctor today at 4 to be assessed further. Patient may benefit from lidocaine cream to use vaginal for reduction of pain and use of the internal dilator.    Personal Factors and Comorbidities Comorbidity 3+;Fitness    Comorbidities mental disorder; bipolar; vaginal cancer with radiation from 08/24/19-09/22/19; Abodminal hysterectomy 03/11/2017; Myomectomy 07/22/19    Examination-Activity Limitations Continence;Locomotion Level;Sit    Examination-Participation Restrictions Community Activity    Stability/Clinical Decision Making Evolving/Moderate complexity    Rehab Potential Good    PT Frequency 1x / week    PT Duration Other (comment)   4 months   PT Treatment/Interventions ADLs/Self Care Home Management;Biofeedback;Therapeutic activities;Therapeutic exercise;Neuromuscular re-education;Patient/family education;Manual techniques    PT Next Visit Plan see what MD says, attempt internal vaginal work; may benefit from lidocaine cream to use internally    PT Home Exercise Plan Access Code: [email or web address]    Consulted and Agree with Plan of Care Patient             Patient will benefit from skilled therapeutic intervention in order to improve the following deficits and impairments:  Decreased coordination, Increased fascial restricitons, Pain, Decreased strength, Decreased activity tolerance, Decreased endurance, Increased muscle spasms  Visit  Diagnosis: Muscle weakness (generalized)  Other lack of coordination  Cramp and spasm  Pelvic pain  Other urinary incontinence  Vaginal cancer ([place])     Problem List Patient Active Problem List   Diagnosis Date Noted   Menopausal symptoms 05/02/2021   Leukopenia    Vitamin D deficiency    Acute lower UTI    Vaginal cancer ([place]) 07/22/2019   Morbid obesity with BMI of 40.0-44.9, adult ([place]) 06/11/2019   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[place]) 05/28/2019   Visit for routine gyn exam 04/21/2019   Vaginal atrophy 04/21/2019   Urinary tract infection 97/98/9211   Diastolic dysfunction 94/17/4081   Constipation 05/12/2015   ESBL (extended spectrum beta-lactamase) producing bacteria infection 03/17/2015   Megaloblastic anemia 02/22/2015   Anxiety state 02/20/2015   Claustrophobia 02/20/2015   Transaminitis 02/18/2015   Chest pain 02/18/2015   Abdominal pain    Dizziness    Hypotension 01/04/2015   Bipolar disorder ([place]) 06/29/2012     [facility] [address] [place], [place], [address] Phone: [phone]   Fax:  [phone]  Name: Jordan [name] MRN: [id number] Date of Birth: [birth year]

## 2021-05-08 ENCOUNTER — Telehealth: Payer: Self-pay | Admitting: *Deleted

## 2021-05-08 NOTE — Telephone Encounter (Signed)
CALLED PATIENT TO INFORM OF FU APPT. WITH DR. KINARD ON 10-01-21 @ 3 PM, LVM FOR A RETURN CALL

## 2021-05-09 ENCOUNTER — Encounter: Payer: Self-pay | Admitting: General Practice

## 2021-05-09 NOTE — Progress Notes (Signed)
[place] Spiritual Care Note  Received and returned call from [place], providing spiritual and emotional support related to vaginal pain/treatment issues and family concerns. She is continuing to work with her counselor regularly, as well.   [place], [place], [facility] Pager [phone] Voicemail [phone]

## 2021-05-14 ENCOUNTER — Encounter: Payer: Self-pay | Admitting: General Practice

## 2021-05-14 NOTE — Progress Notes (Signed)
[place] Spiritual Care Note  Received call from [place] to process pain and gyn health issues. Provided empathic listening, affirmation of strengths, and encouragement. [place] calls readily as needed/desired.   [place], [place], [facility] Pager [phone] Voicemail [phone]

## 2021-05-15 ENCOUNTER — Encounter (HOSPITAL_COMMUNITY): Payer: Self-pay | Admitting: Emergency Medicine

## 2021-05-15 ENCOUNTER — Emergency Department (HOSPITAL_COMMUNITY)
Admission: EM | Admit: 2021-05-15 | Discharge: 2021-05-16 | Disposition: A | Discharge status: Home / Self Care | Payer: Medicaid Other | Attending: Emergency Medicine | Admitting: Emergency Medicine

## 2021-05-15 DIAGNOSIS — I5030 Unspecified diastolic (congestive) heart failure: Secondary | ICD-10-CM | POA: Insufficient documentation

## 2021-05-15 DIAGNOSIS — Z8544 Personal history of malignant neoplasm of other female genital organs: Secondary | ICD-10-CM | POA: Diagnosis not present

## 2021-05-15 DIAGNOSIS — R197 Diarrhea, unspecified: Secondary | ICD-10-CM | POA: Diagnosis present

## 2021-05-15 DIAGNOSIS — R102 Pelvic and perineal pain: Secondary | ICD-10-CM | POA: Diagnosis not present

## 2021-05-15 NOTE — ED Triage Notes (Signed)
Per EMS-states vaginal pain, bleeding and diarrhea-patient painful with movement-painful urination

## 2021-05-15 NOTE — ED Notes (Signed)
Screener informed NT that pt had and accident and needed help to be cleaned up NT offers to help pt get cleaned up pt states "She cant stand and wants a bed in the back" NT informed pt no beds available at the moment

## 2021-05-16 MED ORDER — KETOROLAC TROMETHAMINE 60 MG/2ML IM SOLN
15.0000 mg | Freq: Once | INTRAMUSCULAR | Status: AC
  Administered 2021-05-16: 15 mg via INTRAMUSCULAR
  Filled 2021-05-16: qty 2

## 2021-05-16 MED ORDER — ACETAMINOPHEN 500 MG PO TABS
1000.0000 mg | ORAL_TABLET | Freq: Once | ORAL | Status: AC
  Administered 2021-05-16: 1000 mg via ORAL
  Filled 2021-05-16: qty 2

## 2021-05-16 MED ORDER — OXYCODONE HCL 5 MG PO TABS
15.0000 mg | ORAL_TABLET | Freq: Once | ORAL | Status: AC
  Administered 2021-05-16: 15 mg via ORAL
  Filled 2021-05-16: qty 3

## 2021-05-16 NOTE — Discharge Instructions (Addendum)
Please follow-up with your oncologist about your ongoing vaginal pain.  Please take your medications as prescribed.  For diarrhea you can take Imodium which is over-the-counter.  Please follow the instructions on the bottle.  He can also try a barrier cream if you think that your diarrhea is irritating your skin.  A common one would be something like Desitin, please apply it to the skin after cleaning herself after a bowel movement and before bed.

## 2021-05-16 NOTE — ED Provider Notes (Signed)
[facility] DEPT Provider Note   CSN: [id number] Arrival date & time: 05/15/21  1159     History Chief Complaint  Patient presents with   Vaginal Pain    Jordan [name] is a 55 y.o. female.  55 yo F with a significant past medical history of vaginal cancer status postradiation therapy comes in with a chief complaint of vaginal pain and diarrhea.  She saw her radiation oncologist this week and was told that she might be having a side effect of the radiation.  She felt that the pain had gotten much worse and so took an ambulance here to be evaluated.  Denies any fevers or chills.  Denies any discharge.  The history is provided by the patient.  Vaginal Pain Pertinent negatives include no chest pain, no headaches and no shortness of breath.  Illness Severity:  Moderate Onset quality:  Gradual Duration:  6 months Timing:  Intermittent Progression:  Waxing and waning Chronicity:  Chronic Associated symptoms: diarrhea   Associated symptoms: no chest pain, no congestion, no fever, no headaches, no myalgias, no nausea, no rhinorrhea, no shortness of breath, no vomiting and no wheezing       Past Medical History:  Diagnosis Date   Alcohol abuse    Anemia    patient denies   Bipolar 1 disorder ([place])    No medications currently   CAP (community acquired pneumonia) 03/17/2015   History of radiation therapy 08/24/19-09/22/19   Vaginal brachytherapy   Dr. 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[place]) 05/28/2019   Visit for routine gyn exam 04/21/2019   Vaginal atrophy 04/21/2019   Urinary tract infection [id number]   Diastolic dysfunction [id number]   Constipation 05/12/2015   ESBL (extended spectrum beta-lactamase) producing bacteria infection 03/17/2015   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   Anxiety state 02/20/2015   Claustrophobia 02/20/2015   Transaminitis 02/18/2015   Chest pain 02/18/2015   Abdominal pain    Dizziness    Hypotension 01/04/2015   Bipolar disorder ([place]) 06/29/2012    Past Surgical History:  Procedure Laterality Date   ABDOMINAL HYSTERECTOMY     CERVICAL CONIZATION W/BX N/A 07/01/2016   Procedure: CONIZATION CERVIX WITH BIOPSY;  Surgeon: Chancy Milroy, MD;  Location: [facility];  Service: Gynecology;  Laterality: N/A;   CHOLECYSTECTOMY     EXTERNAL FIXATION LEG Right 04/09/2020   Procedure: EXTERNAL FIXATION LEG;  Surgeon: Hiram Gash, MD;  Location: [facility];  Service: Orthopedics;  Laterality: Right;   HYSTEROSCOPY WITH D & C N/A 01/25/2016   Procedure: DILATATION AND CURETTAGE /HYSTEROSCOPY;  Surgeon: Mora Bellman, MD;  Location: [facility];  Service: Gynecology;  Laterality: N/A;   LYMPH NODE BIOPSY Bilateral 07/22/2019   Procedure: LYMPH NODE BIOPSY;  Surgeon: Everitt Amber, MD;  Location: [facility];  Service: Gynecology;  Laterality: Bilateral;  OPEN REDUCTION INTERNAL FIXATION (ORIF) TIBIA/FIBULA FRACTURE Right 04/10/2020   Procedure: OPEN REDUCTION INTERNAL FIXATION (ORIF) TIBIA/FIBULA FRACTURE;  Surgeon: Shona Needles, MD;  Location: [facility];  Service: Orthopedics;  Laterality: Right;   ROBOT ASSISTED MYOMECTOMY N/A 07/22/2019   Procedure: XI ROBOTIC ASSISTED LAPAROSCOPIC RADICAL UPPER VAGINECTOMY, LEFT SALPINECTOMY, RIGHT SALPINGOOOPHERECTOMY;  Surgeon: Everitt Amber, MD;  [facility];  Service: Gynecology;  Laterality: N/A;   VAGINAL HYSTERECTOMY N/A 03/11/2017    Procedure: HYSTERECTOMY VAGINAL WITH MORCELLATION;  Surgeon: Chancy Milroy, MD;  Location: [facility];  Service: Gynecology;  Laterality: N/A;     OB History     Gravida  1   Para  1   Term  1   Preterm      AB      Living  1      SAB      IAB      Ectopic      Multiple      Live Births  1           Family History  Problem Relation Age of Onset   Alcohol abuse Father    Cancer Father    Breast cancer Neg Hx     Social History   Tobacco Use   Smoking status: Never   Smokeless tobacco: Never  Vaping Use   Vaping Use: Never used  Substance Use Topics   Alcohol use: Not Currently   Drug use: No    Home Medications Prior to Admission medications   Medication Sig Start Date End Date Taking? Authorizing Provider  baclofen (LIORESAL) 10 MG tablet Take 10 mg by mouth 3 (three) times daily as needed. 04/19/21   [provider]  Baclofen 5 MG TABS Take 1 tablet by mouth 3 (three) times daily as needed. 01/03/21   [provider]  desvenlafaxine (PRISTIQ) 50 MG 24 hr tablet Take by mouth.    [provider]  docusate sodium (COLACE) 100 MG capsule Take 1 capsule (100 mg total) by mouth every 12 (twelve) hours. 05/04/21   Jaynee Eagles, PA-C  estrogens, conjugated, (PREMARIN) 0.3 MG tablet Take 1 tablet (0.3 mg total) by mouth daily. Take daily for 21 days then do not take for 7 days. 05/02/21   Chancy Milroy, MD  Ferrous Sulfate (IRON) 325 (65 Fe) MG TABS Take 1 tablet by mouth every other day. 02/03/20   [provider]  ibuprofen (ADVIL) 600 MG tablet Take 1 tablet (600 mg total) by mouth every 6 (six) hours as needed. 03/26/21   Everitt Amber, MD  oxybutynin (DITROPAN XL) 15 MG 24 hr tablet Take 15 mg by mouth 2 (two) times daily. 01/25/21   [provider]  Oxycodone HCl 10 MG TABS Take 15 mg by mouth. 01/27/20   [provider]  Vitamin D, Ergocalciferol, (DRISDOL) 1.25 MG (50000 UNIT) CAPS capsule Take 50,000 Units  by mouth. Every 14 days 11/13/20   [provider]    Allergies    Ciprofloxacin, Citalopram, Lamotrigine, Sulfa antibiotics, and Tramadol  Review of Systems   Review of Systems  Constitutional:  Negative for chills and fever.  HENT:  Negative for congestion and rhinorrhea.   Eyes:  Negative for redness and visual disturbance.  Respiratory:  Negative for shortness of breath and wheezing.   Cardiovascular:  Negative for chest pain and palpitations.  Gastrointestinal:  Positive for diarrhea. Negative for nausea and vomiting.  Genitourinary:  Positive for difficulty urinating, pelvic pain,  urgency and vaginal pain. Negative for dysuria and vaginal discharge.  Musculoskeletal:  Negative for arthralgias and myalgias.  Skin:  Negative for pallor and wound.  Neurological:  Negative for dizziness and headaches.   Physical Exam Updated Vital Signs BP (!) 149/86 (BP Location: Right Arm)   Pulse 70   Temp 98 F (36.7 C)   Resp 18   LMP  (LMP Unknown)   SpO2 99%   Physical Exam Vitals and nursing note reviewed.  Constitutional:      General: She is not in acute distress.    Appearance: She is well-developed. She is not diaphoretic.  HENT:     Head: Normocephalic and atraumatic.  Eyes:     Pupils: Pupils are equal, round, and reactive to light.  Cardiovascular:     Rate and Rhythm: Normal rate and regular rhythm.     Heart sounds: No murmur heard.   No friction rub. No gallop.  Pulmonary:     Effort: Pulmonary effort is normal.     Breath sounds: No wheezing or rales.  Abdominal:     General: There is no distension.     Palpations: Abdomen is soft.     Tenderness: There is no abdominal tenderness.  Genitourinary:    Comments: No erythema or induration to the vulva or surrounding tissue.  There was a engorged area adjacent to the rectum, mildly tender. Musculoskeletal:        General: No tenderness.     Cervical back: Normal range of motion and neck supple.  Skin:     General: Skin is warm and dry.  Neurological:     Mental Status: She is alert and oriented to person, place, and time.  Psychiatric:        Behavior: Behavior normal.    ED Results / Procedures / Treatments   Labs (all labs ordered are listed, but only abnormal results are displayed) Labs Reviewed - No data to display  EKG None  Radiology No results found.  Procedures Procedures   Medications Ordered in ED Medications  ketorolac (TORADOL) injection 15 mg (has no administration in time range)  oxyCODONE (Oxy IR/ROXICODONE) immediate release tablet 15 mg (has no administration in time range)  acetaminophen (TYLENOL) tablet 1,000 mg (has no administration in time range)    ED Course  I have reviewed the triage vital signs and the nursing notes.  Pertinent labs & imaging results that were available during my care of the patient were reviewed by me and considered in my medical decision making (see chart for details).    MDM Rules/Calculators/A&P                           55 yo F with a chief complaint of vaginal pain and diarrhea.  This is been an ongoing issue for her worsening over the past week.  Has already seen her radiation oncologist for this is on multiple medications for her chronic pain.  I discussed with her that this needs to be managed by her provider that has been taking care of her for this medical problem.  She has no obvious concerning findings on exam.  There is a possible thrombosed hemorrhoid adjacent to her rectum though that does not appear to be the area of pain for her.  We will give her a dose of pain medicine here.  1:00 AM:  I have discussed the diagnosis/risks/treatment options with the patient and believe the pt  to be eligible for discharge home to follow-up with Rad Onc. We also discussed returning to the ED immediately if new or worsening sx occur. We discussed the sx which are most concerning (e.g., sudden worsening pain, fever, inability to tolerate  by mouth) that necessitate immediate return. Medications administered to the patient during their visit and any new prescriptions provided to the patient are listed below.  Medications given during this visit Medications  ketorolac (TORADOL) injection 15 mg (has no administration in time range)  oxyCODONE (Oxy IR/ROXICODONE) immediate release tablet 15 mg (has no administration in time range)  acetaminophen (TYLENOL) tablet 1,000 mg (has no administration in time range)     The patient appears reasonably screen and/or stabilized for discharge and I doubt any other medical condition or other [facility] requiring further screening, evaluation, or treatment in the ED at this time prior to discharge.     Final Clinical Impression(s) / ED Diagnoses Final diagnoses:  Vaginal pain  Diarrhea, unspecified type    Rx / DC Orders ED Discharge Orders     None        Deno Etienne, DO 05/16/21 0100

## 2021-05-18 ENCOUNTER — Telehealth: Payer: Self-pay | Admitting: Oncology

## 2021-05-18 ENCOUNTER — Other Ambulatory Visit: Payer: Self-pay | Admitting: Radiation Oncology

## 2021-05-18 DIAGNOSIS — C52 Malignant neoplasm of vagina: Secondary | ICD-10-CM

## 2021-05-18 NOTE — Telephone Encounter (Signed)
Jordan [name] called and said her urology surgery has been canceled.  She also asked to speak to Dr. Clabe Seal nurse regarding a possible CT scan.  Advised her that I will send a message to have Dr. Clabe Seal nurse call her.

## 2021-05-18 NOTE — Telephone Encounter (Signed)
Notified patient that CT has been ordered and she will be contacted by the radiation scheduler for an appointment once imaging has been approved by her insurance. Patient verbalized understanding.

## 2021-05-23 ENCOUNTER — Encounter: Payer: Self-pay | Admitting: Physical Therapy

## 2021-05-23 ENCOUNTER — Ambulatory Visit: Payer: Medicaid Other | Attending: Gynecologic Oncology | Admitting: Physical Therapy

## 2021-05-23 ENCOUNTER — Other Ambulatory Visit: Payer: Self-pay

## 2021-05-23 ENCOUNTER — Telehealth: Payer: Self-pay | Admitting: Oncology

## 2021-05-23 DIAGNOSIS — R252 Cramp and spasm: Secondary | ICD-10-CM

## 2021-05-23 DIAGNOSIS — M6281 Muscle weakness (generalized): Secondary | ICD-10-CM | POA: Diagnosis present

## 2021-05-23 DIAGNOSIS — C52 Malignant neoplasm of vagina: Secondary | ICD-10-CM | POA: Diagnosis present

## 2021-05-23 DIAGNOSIS — N39498 Other specified urinary incontinence: Secondary | ICD-10-CM | POA: Insufficient documentation

## 2021-05-23 DIAGNOSIS — R278 Other lack of coordination: Secondary | ICD-10-CM | POA: Diagnosis present

## 2021-05-23 DIAGNOSIS — R102 Pelvic and perineal pain unspecified side: Secondary | ICD-10-CM

## 2021-05-23 NOTE — Therapy (Signed)
[facility] [address], [place], [place], [address] Phone: [phone]   Fax:  [phone]  Physical Therapy Treatment  Patient Details  Name: Jordan [name] MRN: [id number] Date of Birth: 1966/01/03 Referring Provider (PT): Joylene John, NP   Encounter Date: 05/23/2021   PT End of Session - 05/23/21 1541     Visit Number 6    Date for PT Re-Evaluation 07/06/21    Authorization Type MCD    Authorization - Visit Number 2    Authorization - Number of Visits 12    PT Start Time [id number]    PT Stop Time 1608   alot of time was used to talk to patient   PT Time Calculation (min) 38 min    Activity Tolerance Patient tolerated treatment well    Behavior During Therapy [facility] for tasks assessed/performed             Past Medical History:  Diagnosis Date   Alcohol abuse    Anemia    patient denies   Bipolar 1 disorder ([place])    No medications currently   CAP (community acquired pneumonia) 03/17/2015   History of radiation therapy 08/24/19-09/22/19   Vaginal brachytherapy   Dr. 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   Mental disorder    Obesity    PICC line infection 05/17/2015   Sepsis due to Gram negative bacteria (MDR E Coli) 02/18/2015   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HCC)    UTI (lower urinary tract infection)    Vaginal Pap smear, abnormal     Past Surgical History:  Procedure Laterality Date   ABDOMINAL HYSTERECTOMY     CERVICAL CONIZATION W/BX N/A 07/01/2016   Procedure: CONIZATION CERVIX WITH BIOPSY;  Surgeon: Chancy Milroy, MD;  Location: [facility];  Service: Gynecology;  Laterality: N/A;   CHOLECYSTECTOMY     EXTERNAL FIXATION LEG Right 04/09/2020   Procedure: EXTERNAL FIXATION LEG;  Surgeon: Hiram Gash, MD;  Location: [facility];  Service: Orthopedics;  Laterality: Right;   HYSTEROSCOPY WITH D & C N/A 01/25/2016   Procedure: DILATATION AND CURETTAGE /HYSTEROSCOPY;   Surgeon: Mora Bellman, MD;  Location: [facility];  Service: Gynecology;  Laterality: N/A;   LYMPH NODE BIOPSY Bilateral 07/22/2019   Procedure: LYMPH NODE BIOPSY;  Surgeon: Everitt Amber, MD;  [facility];  Service: Gynecology;  Laterality: Bilateral;   OPEN REDUCTION INTERNAL FIXATION (ORIF) TIBIA/FIBULA FRACTURE Right 04/10/2020   Procedure: OPEN REDUCTION INTERNAL FIXATION (ORIF) TIBIA/FIBULA FRACTURE;  Surgeon: Shona Needles, MD;  Location: [facility];  Service: Orthopedics;  Laterality: Right;   ROBOT ASSISTED MYOMECTOMY N/A 07/22/2019   Procedure: XI ROBOTIC ASSISTED LAPAROSCOPIC RADICAL UPPER VAGINECTOMY, LEFT SALPINECTOMY, RIGHT SALPINGOOOPHERECTOMY;  Surgeon: Everitt Amber, MD;  [facility];  Service: Gynecology;  Laterality: N/A;   VAGINAL HYSTERECTOMY N/A 03/11/2017   Procedure: HYSTERECTOMY VAGINAL WITH MORCELLATION;  Surgeon: Chancy Milroy, MD;  Location: [facility];  Service: Gynecology;  Laterality: N/A;    There were no vitals filed for this visit.   Subjective Assessment - 05/23/21 1532     Subjective I went to the ER due to bleeding and pain. I took the ambulance to the ER. I will have a MRI tomorrow.  I have not used the dilator awhile back due to the pain.    Patient Stated Goals decrease pain vaginally    Currently in Pain? Yes    Pain Score 10-Worst pain  ever    Pain Location Vagina    Pain Orientation Right;Left    Pain Descriptors / Indicators Aching    Pain Type Acute pain    Pain Onset More than a month ago    Pain Frequency Intermittent    Aggravating Factors  sitting, taking a shower, walking    Pain Relieving Factors laying still on my back                               [facility] Adult PT Treatment/Exercise - 05/23/21 0001       Manual Therapy   Manual Therapy Soft tissue mobilization;Myofascial release    Soft tissue mobilization maual mobilizationof the hip adductor and the insertion into the pubic rami to relax the pelvic floor indirectly     Myofascial Release fascial release suprapubically to relax around the bladder and pelvic floor                      PT Short Term Goals - 04/18/21 1711       PT SHORT TERM GOAL #1   Title independent with initial HEP    Time 4    Status Achieved               PT Long Term Goals - 05/23/21 1543       PT LONG TERM GOAL #1   Title independent with her HEP to perfrom manual work to the pelvic floor and understand vaginal health    Baseline continues to be educated as she progresses    Time 4    Status On-going      PT LONG TERM GOAL #2   Title able to use the vaginal dilator to increase the vaginal canal depth and reduce pain for vaginal exam    Time 4    Period Months    Status On-going      PT LONG TERM GOAL #3   Title urinary leakage decreased so she is using 1 diaper to none during the day and night    Baseline will be having and interstim unit placed on 7/28 to reduce her urinary leakage    Time 4    Period Months    Status Deferred      PT LONG TERM GOAL #4   Title vaginal pain with sitting and walking decreased </= 1-2/10 due to improved tissue mobility and trigger points    Baseline pain level 10/10    Time 4    Period Months    Status On-going                   Plan - 05/23/21 1542     Clinical Impression Statement Patient continues to have increased pelvic pain at level 10/10. She will be having a MRI tomorrow. We will do internal work after the MRI and everything is cleared. Today we did outside the pelvic floor to indirectly relax the muscles. She is not using her dilator due to pain. Patient urinated on the table due to her difficulty with urine control. Patient will benefit from skilled therapy to reduce pain.    Personal Factors and Comorbidities Comorbidity 3+;Fitness    Comorbidities mental disorder; bipolar; vaginal cancer with radiation from 08/24/19-09/22/19; Abodminal hysterectomy 03/11/2017; Myomectomy 07/22/19     Examination-Activity Limitations Continence;Locomotion Level;Sit    Examination-Participation Restrictions Community Activity    Stability/Clinical Decision Making Evolving/Moderate complexity  Rehab Potential Good    PT Frequency 1x / week    PT Duration Other (comment)   4 months   PT Treatment/Interventions ADLs/Self Care Home Management;Biofeedback;Therapeutic activities;Therapeutic exercise;Neuromuscular re-education;Patient/family education;Manual techniques    PT Next Visit Plan see what MRI says,    PT Home Exercise Plan Access Code: [email or web address]    Consulted and Agree with Plan of Care Patient             Patient will benefit from skilled therapeutic intervention in order to improve the following deficits and impairments:  Decreased coordination, Increased fascial restricitons, Pain, Decreased strength, Decreased activity tolerance, Decreased endurance, Increased muscle spasms  Visit Diagnosis: Muscle weakness (generalized)  Other lack of coordination  Cramp and spasm  Pelvic pain     Problem List Patient Active Problem List   Diagnosis Date Noted   Menopausal symptoms 05/02/2021   Leukopenia    Vitamin D deficiency    Acute lower UTI    Vaginal cancer ([place]) 07/22/2019   Morbid obesity with BMI of 40.0-44.9, adult ([place]) 06/11/2019   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[place]) 05/28/2019   Visit for routine gyn exam 04/21/2019   Vaginal atrophy 04/21/2019   Urinary tract infection [id number]   Diastolic dysfunction XX123456   Constipation 05/12/2015   ESBL (extended spectrum beta-lactamase) producing bacteria infection 03/17/2015   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 [place], [place], [address] Phone: [phone]   Fax:  [phone]  Name: Jordan [name] MRN: [id number] Date of Birth: 1966/09/10

## 2021-05-23 NOTE — Telephone Encounter (Signed)
Jordan [name] called regarding her CT scan tomorrow. Advised her when and how to drink the contrast and that she should not eat or drink anything else after 12:00.  She verbalized agreement and will pick up the contrast today.

## 2021-05-24 ENCOUNTER — Ambulatory Visit (HOSPITAL_COMMUNITY)
Admission: RE | Admit: 2021-05-24 | Discharge: 2021-05-24 | Disposition: A | Discharge status: Home / Self Care | Payer: Medicaid Other | Source: Ambulatory Visit | Attending: Radiation Oncology | Admitting: Radiation Oncology

## 2021-05-24 DIAGNOSIS — C52 Malignant neoplasm of vagina: Secondary | ICD-10-CM

## 2021-05-24 MED ORDER — IOHEXOL 9 MG/ML PO SOLN
ORAL | Status: AC
  Filled 2021-05-24: qty 1000

## 2021-05-24 MED ORDER — IOHEXOL 350 MG/ML SOLN
80.0000 mL | Freq: Once | INTRAVENOUS | Status: AC | PRN
  Administered 2021-05-24: 80 mL via INTRAVENOUS

## 2021-05-24 MED ORDER — IOHEXOL 9 MG/ML PO SOLN
500.0000 mL | ORAL | Status: AC
  Administered 2021-05-24: 1000 mL via ORAL

## 2021-05-25 ENCOUNTER — Encounter: Payer: Self-pay | Admitting: General Practice

## 2021-05-25 ENCOUNTER — Telehealth: Payer: Self-pay | Admitting: *Deleted

## 2021-05-25 NOTE — Progress Notes (Signed)
[facility] Spiritual Care Note  Had lengthy birthday check-in call from [place]. She shared and processed anxiety about scan results, as well as some family concerns. Provided empathic listening, emotional support, and birthday felicitations. She plans to phone again as needed.   [place], [place], [facility] Pager [phone] Voicemail [phone]

## 2021-05-25 NOTE — Telephone Encounter (Signed)
RETURNED PATIENT'S PHONE CALL, SPOKE WITH PATIENT AND ARRANGED FU FOR HER TO GET CT RESULTS FROM DR. KINARD, APPT. IS 06-04-21 @ 4:30 PM, PATIENT AGREED TO DAY AND TIME

## 2021-05-28 ENCOUNTER — Other Ambulatory Visit: Payer: Self-pay

## 2021-05-28 ENCOUNTER — Ambulatory Visit: Payer: Medicaid Other | Admitting: Physical Therapy

## 2021-05-28 ENCOUNTER — Encounter: Payer: Self-pay | Admitting: Physical Therapy

## 2021-05-28 DIAGNOSIS — N39498 Other specified urinary incontinence: Secondary | ICD-10-CM

## 2021-05-28 DIAGNOSIS — R278 Other lack of coordination: Secondary | ICD-10-CM

## 2021-05-28 DIAGNOSIS — C52 Malignant neoplasm of vagina: Secondary | ICD-10-CM

## 2021-05-28 DIAGNOSIS — R102 Pelvic and perineal pain unspecified side: Secondary | ICD-10-CM

## 2021-05-28 DIAGNOSIS — M6281 Muscle weakness (generalized): Secondary | ICD-10-CM

## 2021-05-28 DIAGNOSIS — R252 Cramp and spasm: Secondary | ICD-10-CM

## 2021-05-28 NOTE — Therapy (Signed)
[facility] [address] [place], [place], [address] Phone: [phone]   Fax:  [phone]  Physical Therapy Treatment  Patient Details  Name: Jordan [name] MRN: [id number] Date of Birth: 1966-06-20 Referring Provider (PT): Joylene John, NP   Encounter Date: 05/28/2021   PT End of Session - 05/28/21 0809     Visit Number 7    Date for PT Re-Evaluation 10/05/21    Authorization Type MCD    Authorization Time Period 7/11-10/22    Authorization - Visit Number 3    Authorization - Number of Visits 12    PT Start Time 0800    PT Stop Time 0840    PT Time Calculation (min) 40 min    Activity Tolerance Patient tolerated treatment well    Behavior During Therapy [facility] for tasks assessed/performed             Past Medical History:  Diagnosis Date   Alcohol abuse    Anemia    patient denies   Bipolar 1 disorder ([place])    No medications currently   CAP (community acquired pneumonia) 03/17/2015   History of radiation therapy 08/24/19-09/22/19   Vaginal brachytherapy   Dr. 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   Mental disorder    Obesity    PICC line infection 05/17/2015   Sepsis due to Gram negative bacteria (MDR E Coli) 02/18/2015   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HCC)    UTI (lower urinary tract infection)    Vaginal Pap smear, abnormal     Past Surgical History:  Procedure Laterality Date   ABDOMINAL HYSTERECTOMY     CERVICAL CONIZATION W/BX N/A 07/01/2016   Procedure: CONIZATION CERVIX WITH BIOPSY;  Surgeon: Chancy Milroy, MD;  Location: [facility];  Service: Gynecology;  Laterality: N/A;   CHOLECYSTECTOMY     EXTERNAL FIXATION LEG Right 04/09/2020   Procedure: EXTERNAL FIXATION LEG;  Surgeon: Hiram Gash, MD;  Location: [place];  Service: Orthopedics;  Laterality: Right;   HYSTEROSCOPY WITH D & C N/A 01/25/2016   Procedure: DILATATION AND CURETTAGE /HYSTEROSCOPY;   Surgeon: Mora Bellman, MD;  Location: [facility];  Service: Gynecology;  Laterality: N/A;   LYMPH NODE BIOPSY Bilateral 07/22/2019   Procedure: LYMPH NODE BIOPSY;  Surgeon: Everitt Amber, MD;  Location: [facility];  Service: Gynecology;  Laterality: Bilateral;   OPEN REDUCTION INTERNAL FIXATION (ORIF) TIBIA/FIBULA FRACTURE Right 04/10/2020   Procedure: OPEN REDUCTION INTERNAL FIXATION (ORIF) TIBIA/FIBULA FRACTURE;  Surgeon: Shona Needles, MD;  Location: [place];  Service: Orthopedics;  Laterality: Right;   ROBOT ASSISTED MYOMECTOMY N/A 07/22/2019   Procedure: XI ROBOTIC ASSISTED LAPAROSCOPIC RADICAL UPPER VAGINECTOMY, LEFT SALPINECTOMY, RIGHT SALPINGOOOPHERECTOMY;  Surgeon: Everitt Amber, MD;  Location: [facility];  Service: Gynecology;  Laterality: N/A;   VAGINAL HYSTERECTOMY N/A 03/11/2017   Procedure: HYSTERECTOMY VAGINAL WITH MORCELLATION;  Surgeon: Chancy Milroy, MD;  Location: [facility];  Service: Gynecology;  Laterality: N/A;    There were no vitals filed for this visit.   Subjective Assessment - 05/28/21 0806     Subjective I had the MRI. I do not have the results of the test at this time. I forgot to bring the device.    Patient Stated Goals decrease pain vaginally    Currently in Pain? Yes    Pain Score 10-Worst pain ever    Pain Location Vagina    Pain Orientation Mid    Pain  Descriptors / Indicators Constant    Pain Type Chronic pain    Pain Onset More than a month ago    Pain Frequency Constant    Aggravating Factors  sitting, taking a shower, walking    Pain Relieving Factors laying still on my back    Multiple Pain Sites No                [facility] PT Assessment - 05/28/21 0001       Assessment   Medical Diagnosis C52 squamous cell carcinoma of vagina; R10.2 Vaginal pain    Referring Provider (PT) Joylene John, NP    Onset Date/Surgical Date 08/24/19    Prior Therapy not for pelvic pain      Precautions   Precautions Other (comment)    Precaution Comments cancer with  radiation      Restrictions   Weight Bearing Restrictions No      [place] residence      Prior Function   Level of Independence Independent    Vocation On disability      Cognition   Overall Cognitive Status Within Functional Limits for tasks assessed      Observation/Other Assessments   Focus on Therapeutic Outcomes (FOTO)  No FOTO - MCD      PROM   Overall PROM Comments full bilateral hip ROM      Palpation   Palpation comment tenderness in the lower abdominal, pubic bone, ischiocavernosus      Ambulation/Gait   Ambulation/Gait Yes    Assistive device Rolling walker                        Pelvic Floor Special Questions - 05/28/21 0001     Currently Sexually Active No    Urinary Leakage Yes    Pad use diaper 2-3; night 3    Activities that cause leaking With strong urge;Coughing;Walking;Other    Other activities that cause leaking just happens without knowing it    Urinary urgency Yes    Urinary frequency none    Fluid intake reduces fluid due to leakage    Skin Integrity Intact   dryness   Pelvic Floor Internal Exam Patient confirms identificaiton and approves PT to assess pelvic floor assessment and treatment    Exam Type Vaginal    Strength weak squeeze, no lift               [facility] Adult PT Treatment/Exercise - 05/28/21 0001       Manual Therapy   Manual Therapy Internal Pelvic Floor    Internal Pelvic Floor manual work to the vulvar and labia area while monitoring for pain and went gently; manual work around the introitus then going the therapist PIP for first time without increased pain   used [employer]                     PT Short Term Goals - 05/28/21 0813       PT SHORT TERM GOAL #1   Title independent with initial HEP    Baseline educated    Time 4    Period Weeks    Status Achieved    Target Date 04/02/21               PT Long Term Goals - 05/28/21  0812       PT LONG TERM GOAL #1   Title independent with  her HEP to perfrom manual work to the pelvic floor and understand vaginal health    Baseline continues to be educated as she progresses; not using the dilator due to pain    Time 4    Period Months    Status On-going      PT LONG TERM GOAL #2   Title able to use the vaginal dilator to increase the vaginal canal depth and reduce pain for vaginal exam    Baseline just learned how to use her dilator and will need further instruction; not using dilator due to pain    Period Months    Status On-going      PT LONG TERM GOAL #3   Title urinary leakage decreased so she is using 1 diaper to none during the day and night    Baseline will be having and interstim unit placed on 7/28 to reduce her urinary leakage    Time 4    Period Months    Status Deferred      PT LONG TERM GOAL #4   Title vaginal pain with sitting and walking decreased </= 1-2/10 due to improved tissue mobility and trigger points    Baseline pain level 10/10; had a MRI due to the pain    Time 4    Period Months    Status On-going                   Plan - 05/28/21 0839     Clinical Impression Statement Patient has had difficulty with using her dilator and therapist working internally due to pain. Patient has had a MRI and waiting for the results due to her pain. Patient was able to tolerate the therapist index finger into the canal to the PIP joint for first time without increased pain. Patient continues to leak urine and will have an interstim in September. She will start back with the dilator after she gets the results from the MRI. Patient has pain as the therapist goes further into the canal. Patient pain level in the vagina is 10/10 while using the dilator and during activity. Patient will benefit from skilled therapy to reduce pain and have her resume using the dilator.    Personal Factors and Comorbidities Comorbidity 3+;Fitness    Comorbidities mental  disorder; bipolar; vaginal cancer with radiation from 08/24/19-09/22/19; Abodminal hysterectomy 03/11/2017; Myomectomy 07/22/19    Examination-Activity Limitations Continence;Locomotion Level;Sit    Examination-Participation Restrictions Community Activity    Stability/Clinical Decision Making Evolving/Moderate complexity    Rehab Potential Good    PT Frequency 1x / week    PT Duration 12 weeks   4 months   PT Treatment/Interventions ADLs/Self Care Home Management;Biofeedback;Therapeutic activities;Therapeutic exercise;Neuromuscular re-education;Patient/family education;Manual techniques    PT Next Visit Plan internal manual therapy while monitoring for pain    PT Home Exercise Plan Access Code: [facility]    Recommended Other Services sent MD renewal note    Consulted and Agree with Plan of Care Patient             Patient will benefit from skilled therapeutic intervention in order to improve the following deficits and impairments:  Decreased coordination, Increased fascial restricitons, Pain, Decreased strength, Decreased activity tolerance, Decreased endurance, Increased muscle spasms  Visit Diagnosis: Muscle weakness (generalized) - Plan: PT plan of care cert/re-cert  Other lack of coordination - Plan: PT plan of care cert/re-cert  Cramp and spasm - Plan: PT plan of care cert/re-cert  Pelvic pain - Plan: PT  plan of care cert/re-cert  Other urinary incontinence - Plan: PT plan of care cert/re-cert  Vaginal cancer [facility]) - Plan: PT plan of care cert/re-cert     Problem List Patient Active Problem List   Diagnosis Date Noted   Menopausal symptoms 05/02/2021   Leukopenia    Vitamin D deficiency    Acute lower UTI    Vaginal cancer ([place]) 07/22/2019   Morbid obesity with BMI of 40.0-44.9, adult ([place]) 06/11/2019   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[place]) 05/28/2019   Visit for routine gyn exam 04/21/2019   Vaginal atrophy 04/21/2019   Urinary tract infection [id number]    Diastolic dysfunction [id number]   Constipation 05/12/2015   ESBL (extended spectrum beta-lactamase) producing bacteria infection 03/17/2015   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 [place], [place], [address] Phone: [phone]   Fax:  [phone]  Name: Luci Rei MRN: [id number] Date of Birth: Jul 13, 1966

## 2021-06-02 NOTE — Progress Notes (Signed)
Radiation Oncology         [phone] ________________________________  Name: Jordan [name] MRN: [id number]  Date: 06/04/2021  DOB: May 19, 1966  Follow-Up Visit Note  CC: [name], NP  Everitt Amber, MD    ICD-10-CM   1. Vaginal cancer ([place])  C52       Diagnosis: FIGO Stage I (pT1a, pN0) Squamous Cell Carcinoma of the Vagina  Interval Since Last Radiation:  1 year, 8 months, and 13 days   Radiation Treatment Dates: 08/24/2019 through 09/22/2019 Site Technique Total Dose (Gy) Dose per Fx (Gy) Completed Fx Beam Energies  Pelvis: Pelvis_vagina HDR-brachy 30/30 6 5/5 Ir-192   Narrative:  The patient returns today for routine follow-up to review recent CT results.    Pertinent imaging since the patient was last seen includes: CT of the abdomen and pelvis taken on 05/24/21 which demonstrated no evidence of recurrent or metastatic carcinoma within the pelvis or abdomen. Incidental findings from CT included stable diffuse bladder wall thickening; likely due to prior radiation therapy.  However the patient did not receive external beam radiation therapy and the bladder was not treated with her vaginal brachytherapy treatments.  Patient's CT scan did show some air in the bladder and question whether the patient may have a gas producing bladder infection or recent catheterization.  The patient does report when she does have urine specimens at [facility] that they will always do in and out cath which may explain the air in the bladder area.  To rule out potential infection the patient will return to our department for urinalysis culture and sensitivity tomorrow.  Her appointment today was too late to send her to the lab for this issue.  Of note: the patient presented to the [facility] ED on 05/15/21 with complaints of vaginal pain and diarrhea. The patient noted that the pain had gradually worsened over the past week. On ROS from visit, the patient indicated difficulty urinating, pelvic pain,  urgency, and vaginal pain. No concerning findings were found on physical exam; a possible thrombosed hemorrhoid to her rectum was noted although the patient denied rectal pain. The patient was accordingly given a toradol injection, prescribed oxycodone, then discharged home.    She denies any chills or temperature elevation.  sHe denies any vaginal bleeding or discharge.  She complains of pain in the vaginal area which she feels started after she completed her vaginal brachytherapy treatments.                           Allergies:  is allergic to ciprofloxacin, citalopram, lamotrigine, sulfa antibiotics, and tramadol.  Meds: Current Outpatient Medications  Medication Sig Dispense Refill   baclofen (LIORESAL) 10 MG tablet Take 10 mg by mouth 3 (three) times daily as needed.     Baclofen 5 MG TABS Take 1 tablet by mouth 3 (three) times daily as needed.     desvenlafaxine (PRISTIQ) 50 MG 24 hr tablet Take by mouth.     docusate sodium (COLACE) 100 MG capsule Take 1 capsule (100 mg total) by mouth every 12 (twelve) hours. 60 capsule 0   estrogens, conjugated, (PREMARIN) 0.3 MG tablet Take 1 tablet (0.3 mg total) by mouth daily. Take daily for 21 days then do not take for 7 days. 30 tablet 5   Ferrous Sulfate (IRON) 325 (65 Fe) MG TABS Take 1 tablet by mouth every other day.     ibuprofen (ADVIL) 600 MG tablet Take 1  tablet (600 mg total) by mouth every 6 (six) hours as needed. 30 tablet 0   oxybutynin (DITROPAN XL) 15 MG 24 hr tablet Take 15 mg by mouth 2 (two) times daily.     Oxycodone HCl 10 MG TABS Take 15 mg by mouth.     Vitamin D, Ergocalciferol, (DRISDOL) 1.25 MG (50000 UNIT) CAPS capsule Take 50,000 Units by mouth. Every 14 days     No current facility-administered medications for this encounter.    Physical Findings: The patient is in no acute distress. Patient is alert and oriented.  height is '5\' 8"'$  (1.727 m). Her temporal temperature is 96.7 F (35.9 C) (abnormal). Her blood pressure  is 122/76 and her pulse is 112 (abnormal). Her respiration is 112 (abnormal) and oxygen saturation is 97%. .  No significant changes. Lungs are clear to auscultation bilaterally. Heart has regular rate and rhythm. No palpable cervical, supraclavicular, or axillary adenopathy. Abdomen soft, non-tender, normal bowel sounds.  Pelvic exam not performed in light of her recent exam by myself.      Lab Findings: Lab Results  Component Value Date   WBC 3.4 (L) 01/24/2021   HGB 15.4 (H) 01/24/2021   HCT 44.4 01/24/2021   MCV 96.3 01/24/2021   PLT 150 01/24/2021    Radiographic Findings: CT Abdomen Pelvis W Contrast  Result Date: 05/25/2021 CLINICAL DATA:  Follow-up vaginal squamous cell carcinoma. Surveillance. EXAM: CT ABDOMEN AND PELVIS WITH CONTRAST TECHNIQUE: Multidetector CT imaging of the abdomen and pelvis was performed using the standard protocol following bolus administration of intravenous contrast. CONTRAST:  57m OMNIPAQUE IOHEXOL 350 MG/ML SOLN COMPARISON:  Pelvis MRI on 01/01/2021, and CT on 06/18/2019 FINDINGS: Lower Chest: Stable 6 mm right lower lobe pulmonary nodule, consistent with benign etiology. Hepatobiliary: No hepatic masses identified. Prior cholecystectomy. No evidence of biliary obstruction. Pancreas:  No mass or inflammatory changes. Spleen: Within normal limits in size and appearance. Adrenals/Urinary Tract: No masses identified. Small right renal cyst again noted. No evidence of ureteral calculi or hydronephrosis. Diffuse bladder wall thickening is again seen, likely due to prior radiation therapy. Small amount of gas noted within the bladder lumen, likely from recent instrumentation. Stomach/Bowel: No evidence of obstruction, inflammatory process or abnormal fluid collections. Normal appendix visualized. Vascular/Lymphatic: No pathologically enlarged lymph nodes. No acute vascular findings. Reproductive: Prior hysterectomy noted. Adnexal regions are unremarkable in  appearance. Other:  None. Musculoskeletal:  No suspicious bone lesions identified. IMPRESSION: No evidence of recurrent or metastatic carcinoma within the pelvis or abdomen. Stable diffuse bladder wall thickening, likely due to prior radiation therapy. Tiny amount of gas noted in urinary bladder; recommend correlation for recent bladder catheterization/instrumentation. If none, considerations would include gas-forming urinary tract infection or bladder fistula. Electronically Signed   By: JMarlaine HindM.D.   On: 05/25/2021 15:25    Impression: FIGO Stage I (pT1a, pN0) Squamous Cell Carcinoma of the Vagina  She reports persistent pain in the pelvis area in particular the vaginal region.  She continues to use oxycodone given to her by various urinary care physician as well as Neurontin given to her by her primary care physician.  We discussed use of topical anesthetic the patient would like to try this again.  She has been given samples of viscous Xylocaine to place on her vaginal dilator with placement in the vaginal region.  We again discussed that her scan does not show any signs of recurrence of her vaginal cancer and she was happy with this report.  She continues to be frustrated as to why no one can give her a good explanation for her ongoing pain.  Plan: Patient will proceed with planned procedure next month with Dr. Amalia Hailey at [facility]  She will also see [name] September 6 month follow-up.  Patient does report possibly having a pilonidal cyst and recommended she discuss this with [name] at that time.  Routine follow-up in approximately 3 months depending on Dr. Serita Grit exam and procedure with Dr. Amalia Hailey.   20 minutes of total time was spent for this patient encounter, including preparation, face-to-face counseling with the patient and coordination of care, physical exam, and documentation of the encounter. ____________________________________  Blair Promise, PhD, MD  This document  serves as a record of services personally performed by Gery Pray, MD. It was created on his behalf by Roney Mans, a trained medical scribe. The creation of this record is based on the scribe's personal observations and the provider's statements to them. This document has been checked and approved by the attending provider.

## 2021-06-04 ENCOUNTER — Ambulatory Visit
Admission: RE | Admit: 2021-06-04 | Discharge: 2021-06-04 | Disposition: A | Discharge status: Home / Self Care | Payer: Medicaid Other | Source: Ambulatory Visit | Attending: Radiation Oncology | Admitting: Radiation Oncology

## 2021-06-04 ENCOUNTER — Other Ambulatory Visit: Payer: Self-pay

## 2021-06-04 VITALS — BP 122/76 | HR 112 | Temp 96.7°F | Resp 112 | Ht 68.0 in

## 2021-06-04 DIAGNOSIS — C52 Malignant neoplasm of vagina: Secondary | ICD-10-CM | POA: Insufficient documentation

## 2021-06-04 DIAGNOSIS — R300 Dysuria: Secondary | ICD-10-CM | POA: Insufficient documentation

## 2021-06-04 NOTE — Progress Notes (Signed)
Jordan [name] is here today for follow up post radiation to the pelvic.  They completed their radiation on: 09/22/19  Does the patient complain of any of the following:  Pain:10 Abdominal bloating: No Diarrhea/Constipation: No Nausea/Vomiting: No Vaginal Discharge: No Blood in Urine or Stool: No Urinary Issues (dysuria/emptying/ /incontinence / increased frequency/urgency): yes Does patient report using vaginal dilator 2-3 times a week and/or sexually active 2-3 weeks: yes (dilator) Post radiation skin changes: No   Additional comments if applicable: Mild fatigue

## 2021-06-05 ENCOUNTER — Ambulatory Visit
Admission: RE | Admit: 2021-06-05 | Discharge: 2021-06-05 | Disposition: A | Discharge status: Home / Self Care | Payer: Medicaid Other | Source: Ambulatory Visit | Attending: Radiation Oncology | Admitting: Radiation Oncology

## 2021-06-05 ENCOUNTER — Other Ambulatory Visit: Payer: Self-pay | Admitting: Radiology

## 2021-06-05 ENCOUNTER — Telehealth: Payer: Self-pay | Admitting: *Deleted

## 2021-06-05 DIAGNOSIS — C52 Malignant neoplasm of vagina: Secondary | ICD-10-CM

## 2021-06-05 DIAGNOSIS — R300 Dysuria: Secondary | ICD-10-CM

## 2021-06-05 LAB — URINALYSIS, COMPLETE (UACMP) WITH MICROSCOPIC
Bilirubin Urine: NEGATIVE
Glucose, UA: NEGATIVE mg/dL
Ketones, ur: 5 mg/dL — AB
Nitrite: NEGATIVE
Protein, ur: 100 mg/dL — AB
Specific Gravity, Urine: 1.016 (ref 1.005–1.030)
WBC, UA: >50 WBC/hpf — ABNORMAL HIGH (ref 0–5)
pH: 6 (ref 5.0–8.0)

## 2021-06-05 NOTE — Telephone Encounter (Signed)
CALLED PATIENT TO ASK ABOUT COMING IN FOR LAB TODAY, PATIENT AGREED TO COME @ 3 PM TODAY

## 2021-06-06 LAB — URINE CULTURE

## 2021-06-07 ENCOUNTER — Ambulatory Visit: Payer: Medicaid Other | Admitting: Physical Therapy

## 2021-06-07 ENCOUNTER — Other Ambulatory Visit: Payer: Self-pay

## 2021-06-07 ENCOUNTER — Encounter: Payer: Self-pay | Admitting: Physical Therapy

## 2021-06-07 DIAGNOSIS — R252 Cramp and spasm: Secondary | ICD-10-CM

## 2021-06-07 DIAGNOSIS — R278 Other lack of coordination: Secondary | ICD-10-CM

## 2021-06-07 DIAGNOSIS — M6281 Muscle weakness (generalized): Secondary | ICD-10-CM | POA: Diagnosis not present

## 2021-06-07 DIAGNOSIS — R102 Pelvic and perineal pain: Secondary | ICD-10-CM

## 2021-06-07 NOTE — Therapy (Signed)
[facility] [address], [place], [place], [address] Phone: [phone]   Fax:  [phone]  Physical Therapy Treatment  Patient Details  Name: Jordan [name] MRN: [id number] Date of Birth: December 15, 1965 Referring Provider (PT): Joylene John, NP   Encounter Date: 06/07/2021   PT End of Session - 06/07/21 1541     Visit Number 8    Date for PT Re-Evaluation 10/05/21    Authorization Type MCD    Authorization Time Period 7/11-10/22    Authorization - Visit Number 4    Authorization - Number of Visits 12    PT Start Time [id number]    PT Stop Time 1608    PT Time Calculation (min) 38 min    Activity Tolerance Patient tolerated treatment well    Behavior During Therapy [facility] for tasks assessed/performed             Past Medical History:  Diagnosis Date   Alcohol abuse    Anemia    patient denies   Bipolar 1 disorder ([place])    No medications currently   CAP (community acquired pneumonia) 03/17/2015   History of radiation therapy 08/24/19-09/22/19   Vaginal brachytherapy   Dr. 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   Mental disorder    Obesity    PICC line infection 05/17/2015   Sepsis due to Gram negative bacteria (MDR E Coli) 02/18/2015   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HCC)    UTI (lower urinary tract infection)    Vaginal Pap smear, abnormal     Past Surgical History:  Procedure Laterality Date   ABDOMINAL HYSTERECTOMY     CERVICAL CONIZATION W/BX N/A 07/01/2016   Procedure: CONIZATION CERVIX WITH BIOPSY;  Surgeon: Chancy Milroy, MD;  Location: [place];  Service: Gynecology;  Laterality: N/A;   CHOLECYSTECTOMY     EXTERNAL FIXATION LEG Right 04/09/2020   Procedure: EXTERNAL FIXATION LEG;  Surgeon: Hiram Gash, MD;  Location: [place];  Service: Orthopedics;  Laterality: Right;   HYSTEROSCOPY WITH D & C N/A 01/25/2016   Procedure: DILATATION AND CURETTAGE /HYSTEROSCOPY;   Surgeon: Mora Bellman, MD;  Location: [facility];  Service: Gynecology;  Laterality: N/A;   LYMPH NODE BIOPSY Bilateral 07/22/2019   Procedure: LYMPH NODE BIOPSY;  Surgeon: Everitt Amber, MD;  Location: [facility];  Service: Gynecology;  Laterality: Bilateral;   OPEN REDUCTION INTERNAL FIXATION (ORIF) TIBIA/FIBULA FRACTURE Right 04/10/2020   Procedure: OPEN REDUCTION INTERNAL FIXATION (ORIF) TIBIA/FIBULA FRACTURE;  Surgeon: Shona Needles, MD;  Location: [place];  Service: Orthopedics;  Laterality: Right;   ROBOT ASSISTED MYOMECTOMY N/A 07/22/2019   Procedure: XI ROBOTIC ASSISTED LAPAROSCOPIC RADICAL UPPER VAGINECTOMY, LEFT SALPINECTOMY, RIGHT SALPINGOOOPHERECTOMY;  Surgeon: Everitt Amber, MD;  Location: [facility];  Service: Gynecology;  Laterality: N/A;   VAGINAL HYSTERECTOMY N/A 03/11/2017   Procedure: HYSTERECTOMY VAGINAL WITH MORCELLATION;  Surgeon: Chancy Milroy, MD;  Location: [facility];  Service: Gynecology;  Laterality: N/A;    There were no vitals filed for this visit.   Subjective Assessment - 06/07/21 1534     Subjective The inside of my vagina inflammed. I do not have cancer. I have a UTI right now and have had it the past 16 years. I am not using my dilator due to the pain I have.    Patient Stated Goals decrease pain vaginally    Currently in Pain? Yes    Pain Score 8  Pain Location Vagina    Pain Orientation Mid    Pain Descriptors / Indicators Constant    Pain Type Chronic pain    Pain Onset More than a month ago    Pain Frequency Constant    Aggravating Factors  sitting, taking a shower, walking    Pain Relieving Factors laying still on my back    Multiple Pain Sites No                               [facility] Adult PT Treatment/Exercise - 06/07/21 0001       Self-Care   Self-Care Other Self-Care Comments    Other Self-Care Comments  educated pateitn on which sizes she starts with for using the dilator and how to progress herself      Manual Therapy   Manual  Therapy Internal Pelvic Floor    Internal Pelvic Floor manual work tot he vaginal canal using the [employer] reveleum going very slowly, monitoring for pressure, monitoring for pain                    PT Education - 06/07/21 1612     Education Details educated patient to start using her dilator at home 3 times per week; educated pateint on how to use the syringe of Lidocaine on the perineum and dilator    Person(s) Educated Patient    Methods Explanation;Demonstration    Comprehension Verbalized understanding              PT Short Term Goals - 05/28/21 0813       PT SHORT TERM GOAL #1   Title independent with initial HEP    Baseline educated    Time 4    Period Weeks    Status Achieved    Target Date 04/02/21               PT Long Term Goals - 05/28/21 0812       PT LONG TERM GOAL #1   Title independent with her HEP to perfrom manual work to the pelvic floor and understand vaginal health    Baseline continues to be educated as she progresses; not using the dilator due to pain    Time 4    Period Months    Status On-going      PT LONG TERM GOAL #2   Title able to use the vaginal dilator to increase the vaginal canal depth and reduce pain for vaginal exam    Baseline just learned how to use her dilator and will need further instruction; not using dilator due to pain    Period Months    Status On-going      PT LONG TERM GOAL #3   Title urinary leakage decreased so she is using 1 diaper to none during the day and night    Baseline will be having and interstim unit placed on 7/28 to reduce her urinary leakage    Time 4    Period Months    Status Deferred      PT LONG TERM GOAL #4   Title vaginal pain with sitting and walking decreased </= 1-2/10 due to improved tissue mobility and trigger points    Baseline pain level 10/10; had a MRI due to the pain    Time 4    Period Months    Status On-going  Plan - 06/07/21  1613     Clinical Impression Statement Patient was not understanding how to use the syringe of lidocaine on the dilator and perineum and now she does. Patient was able tolerate the internal work for first time with therapist  going slowly and using the [employer]. Patient was able to tolerate the therapist to have the full length of the therapist index finger, 4 inches. Patient is not able to use the dilator. Patient will benefit from skilled therapy to reduce pain and have her resume using the dilator.    Personal Factors and Comorbidities Comorbidity 3+;Fitness    Comorbidities mental disorder; bipolar; vaginal cancer with radiation from 08/24/19-09/22/19; Abodminal hysterectomy 03/11/2017; Myomectomy 07/22/19    Examination-Activity Limitations Continence;Locomotion Level;Sit    Examination-Participation Restrictions Community Activity    Stability/Clinical Decision Making Evolving/Moderate complexity    Rehab Potential Good    PT Frequency 1x / week    PT Duration 12 weeks   4 months   PT Treatment/Interventions ADLs/Self Care Home Management;Biofeedback;Therapeutic activities;Therapeutic exercise;Neuromuscular re-education;Patient/family education;Manual techniques    PT Next Visit Plan internal manual therapy while monitoring for pain    PT Home Exercise Plan Access Code: [email or web address]    Consulted and Agree with Plan of Care Patient             Patient will benefit from skilled therapeutic intervention in order to improve the following deficits and impairments:  Decreased coordination, Increased fascial restricitons, Pain, Decreased strength, Decreased activity tolerance, Decreased endurance, Increased muscle spasms  Visit Diagnosis: Muscle weakness (generalized)  Other lack of coordination  Cramp and spasm  Pelvic pain     Problem List Patient Active Problem List   Diagnosis Date Noted   Menopausal symptoms 05/02/2021   Leukopenia    Vitamin D deficiency    Acute  lower UTI    Vaginal cancer ([place]) 07/22/2019   Morbid obesity with BMI of 40.0-44.9, adult ([place]) 06/11/2019   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[place]) 05/28/2019   Visit for routine gyn exam 04/21/2019   Vaginal atrophy 04/21/2019   Urinary tract infection [id number]   Diastolic dysfunction [id number]   Constipation 05/12/2015   ESBL (extended spectrum beta-lactamase) producing bacteria infection 03/17/2015   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 [place], [place], [address] Phone: [phone]   Fax:  [phone]  Name: Jordan [name] MRN: [id number] Date of Birth: [birth year]

## 2021-06-11 ENCOUNTER — Encounter: Payer: Self-pay | Admitting: General Practice

## 2021-06-11 NOTE — Progress Notes (Signed)
[place] Spiritual Care Note  Reached Braley by phone following her voicemails for emotional support and normalization of feelings related to coping with ongoing vaginal pain. She is nervous about her upcoming procedure but hopeful that it will help with bladder control. Provided empathic listening, pastoral presence, emotional support. Janari plans to call again as needed.   [place], [place], [facility] Pager [phone] Voicemail [phone]

## 2021-06-13 ENCOUNTER — Ambulatory Visit: Payer: Medicaid Other | Admitting: Physical Therapy

## 2021-06-13 ENCOUNTER — Other Ambulatory Visit: Payer: Self-pay

## 2021-06-13 ENCOUNTER — Encounter: Payer: Self-pay | Admitting: Physical Therapy

## 2021-06-13 DIAGNOSIS — R278 Other lack of coordination: Secondary | ICD-10-CM

## 2021-06-13 DIAGNOSIS — R102 Pelvic and perineal pain: Secondary | ICD-10-CM

## 2021-06-13 DIAGNOSIS — M6281 Muscle weakness (generalized): Secondary | ICD-10-CM | POA: Diagnosis not present

## 2021-06-13 DIAGNOSIS — R252 Cramp and spasm: Secondary | ICD-10-CM

## 2021-06-13 NOTE — Therapy (Addendum)
[facility] [address] [place], [place], [address] Phone: [phone]   Fax:  [phone]  Physical Therapy Treatment  Patient Details  Name: Jordan [name] MRN: [id number] Date of Birth: 01-30-1966 Referring Provider (PT): Joylene John, NP   Encounter Date: 06/13/2021   PT End of Session - 06/13/21 1556     Visit Number 9    Date for PT Re-Evaluation 10/05/21    Authorization Type MCD    Authorization Time Period 7/11-10/22    Authorization - Visit Number 5    Authorization - Number of Visits 12    PT Start Time 6808    PT Stop Time 1600   left early for transportation   PT Time Calculation (min) 30 min    Activity Tolerance Patient tolerated treatment well    Behavior During Therapy [facility] for tasks assessed/performed             Past Medical History:  Diagnosis Date   Alcohol abuse    Anemia    patient denies   Bipolar 1 disorder ([place])    No medications currently   CAP (community acquired pneumonia) 03/17/2015   History of radiation therapy 08/24/19-09/22/19   Vaginal brachytherapy   Dr. 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   Mental disorder    Obesity    PICC line infection 05/17/2015   Sepsis due to Gram negative bacteria (MDR E Coli) 02/18/2015   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HCC)    UTI (lower urinary tract infection)    Vaginal Pap smear, abnormal     Past Surgical History:  Procedure Laterality Date   ABDOMINAL HYSTERECTOMY     CERVICAL CONIZATION W/BX N/A 07/01/2016   Procedure: CONIZATION CERVIX WITH BIOPSY;  Surgeon: Chancy Milroy, MD;  Location: [facility];  Service: Gynecology;  Laterality: N/A;   CHOLECYSTECTOMY     EXTERNAL FIXATION LEG Right 04/09/2020   Procedure: EXTERNAL FIXATION LEG;  Surgeon: Hiram Gash, MD;  Location: [place];  Service: Orthopedics;  Laterality: Right;   HYSTEROSCOPY WITH D & C N/A 01/25/2016   Procedure: DILATATION AND  CURETTAGE /HYSTEROSCOPY;  Surgeon: Mora Bellman, MD;  Location: [facility];  Service: Gynecology;  Laterality: N/A;   LYMPH NODE BIOPSY Bilateral 07/22/2019   Procedure: LYMPH NODE BIOPSY;  Surgeon: Everitt Amber, MD;  Location[facility];  Service: Gynecology;  Laterality: Bilateral;   OPEN REDUCTION INTERNAL FIXATION (ORIF) TIBIA/FIBULA FRACTURE Right 04/10/2020   Procedure: OPEN REDUCTION INTERNAL FIXATION (ORIF) TIBIA/FIBULA FRACTURE;  Surgeon: Shona Needles, MD;  Location: [place];  Service: Orthopedics;  Laterality: Right;   ROBOT ASSISTED MYOMECTOMY N/A 07/22/2019   Procedure: XI ROBOTIC ASSISTED LAPAROSCOPIC RADICAL UPPER VAGINECTOMY, LEFT SALPINECTOMY, RIGHT SALPINGOOOPHERECTOMY;  Surgeon: Everitt Amber, MD;  Location: [facility];  Service: Gynecology;  Laterality: N/A;   VAGINAL HYSTERECTOMY N/A 03/11/2017   Procedure: HYSTERECTOMY VAGINAL WITH MORCELLATION;  Surgeon: Chancy Milroy, MD;  Location: [facility];  Service: Gynecology;  Laterality: N/A;    There were no vitals filed for this visit.   Subjective Assessment - 06/13/21 1533     Subjective I tried to use the dilator and it was okay. I got it in partly. I did the dilator 1 times.    Patient Stated Goals decrease pain vaginally    Currently in Pain? Yes    Pain Score 3     Pain Location Vagina    Pain Orientation Mid  Pain Descriptors / Indicators Constant    Pain Type Chronic pain    Pain Onset More than a month ago    Pain Frequency Constant    Aggravating Factors  sitting, taking a shower, walking    Pain Relieving Factors laying still on my back    Multiple Pain Sites No                               [facility] Adult PT Treatment/Exercise - 06/13/21 0001       Manual Therapy   Manual Therapy Internal Pelvic Floor    Internal Pelvic Floor manual work tot he vaginal canal using the [employer] reveleum going very slowly, monitoring for pressure, monitoring for pain                    PT Education  - 06/13/21 1556     Education Details educated patient to use the dilator 3 times per week    Person(s) Educated Patient    Methods Explanation    Comprehension Verbalized understanding              PT Short Term Goals - 05/28/21 0813       PT SHORT TERM GOAL #1   Title independent with initial HEP    Baseline educated    Time 4    Period Weeks    Status Achieved    Target Date 04/02/21               PT Long Term Goals - 05/28/21 0812       PT LONG TERM GOAL #1   Title independent with her HEP to perfrom manual work to the pelvic floor and understand vaginal health    Baseline continues to be educated as she progresses; not using the dilator due to pain    Time 4    Period Months    Status On-going      PT LONG TERM GOAL #2   Title able to use the vaginal dilator to increase the vaginal canal depth and reduce pain for vaginal exam    Baseline just learned how to use her dilator and will need further instruction; not using dilator due to pain    Period Months    Status On-going      PT LONG TERM GOAL #3   Title urinary leakage decreased so she is using 1 diaper to none during the day and night    Baseline will be having and interstim unit placed on 7/28 to reduce her urinary leakage    Time 4    Period Months    Status Deferred      PT LONG TERM GOAL #4   Title vaginal pain with sitting and walking decreased </= 1-2/10 due to improved tissue mobility and trigger points    Baseline pain level 10/10; had a MRI due to the pain    Time 4    Period Months    Status On-going                   Plan - 06/13/21 1600     Clinical Impression Statement Patiet was able to have the therapist place 2 fingers width into the vaginal canal without pain. Therapist was able to place her whole index finger into the vaginal canal and hit the top of the vault. Patient has tenderness located on the top of the  vaginal vault and the bladder. Patient did not leak urine  during the manual work. Patient was able to use the dilator at home 1 time without increased pain. She was instructed to use it 3 times per week. Patient will benefit from skilled therapy to reduce pain and have her resume using the dilator.    Personal Factors and Comorbidities Comorbidity 3+;Fitness    Comorbidities mental disorder; bipolar; vaginal cancer with radiation from 08/24/19-09/22/19; Abodminal hysterectomy 03/11/2017; Myomectomy 07/22/19    Examination-Activity Limitations Continence;Locomotion Level;Sit    Examination-Participation Restrictions Community Activity    Stability/Clinical Decision Making Evolving/Moderate complexity    Rehab Potential Good    PT Frequency 1x / week    PT Duration 12 weeks   4 months   PT Treatment/Interventions ADLs/Self Care Home Management;Biofeedback;Therapeutic activities;Therapeutic exercise;Neuromuscular re-education;Patient/family education;Manual techniques    PT Next Visit Plan internal manual therapy while monitoring for pain; see if she is using her dilators at home    PT Home Exercise Plan Access Code: [facility]    Recommended Other Services MD signed all notes    Consulted and Agree with Plan of Care Patient             Patient will benefit from skilled therapeutic intervention in order to improve the following deficits and impairments:  Decreased coordination, Increased fascial restricitons, Pain, Decreased strength, Decreased activity tolerance, Decreased endurance, Increased muscle spasms  Visit Diagnosis: Muscle weakness (generalized)  Other lack of coordination  Cramp and spasm  Pelvic pain     Problem List Patient Active Problem List   Diagnosis Date Noted   Menopausal symptoms 05/02/2021   Leukopenia    Vitamin D deficiency    Acute lower UTI    Vaginal cancer ([place]) 07/22/2019   Morbid obesity with BMI of 40.0-44.9, adult ([place]) 06/11/2019   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[place]) 05/28/2019   Visit for routine gyn  exam 04/21/2019   Vaginal atrophy 04/21/2019   Urinary tract infection 60/60/0459   Diastolic dysfunction 97/74/1423   Constipation 05/12/2015   ESBL (extended spectrum beta-lactamase) producing bacteria infection 03/17/2015   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, [place], [place], [address] Phone: [phone]   Fax:  [phone]  Name: Jordan [name] MRN: [id number] Date of Birth: 1966/10/17  PHYSICAL THERAPY DISCHARGE SUMMARY  Visits from Start of Care: 9  Current functional level related to goals / functional outcomes: See above. Patient has cancelled her last few appointments due to UTI and working on treating it. She has not called back for further appointments.    Remaining deficits: See above. Unable to assess patient due to her not returning to therapy.  Education:  HEP   Patient agrees to discharge. Patient goals were not met. Patient is being discharged due to not returning since the last visit. Thank you for the referral. Earlie Counts, PT 08/24/21 10:43 AM

## 2021-06-26 ENCOUNTER — Inpatient Hospital Stay: Payer: Medicaid Other | Attending: Obstetrics & Gynecology | Admitting: Gynecologic Oncology

## 2021-06-26 ENCOUNTER — Other Ambulatory Visit: Payer: Self-pay

## 2021-06-26 ENCOUNTER — Encounter: Payer: Self-pay | Admitting: Gynecologic Oncology

## 2021-06-26 VITALS — BP 117/79 | HR 101 | Temp 98.3°F | Resp 16 | Wt 209.2 lb

## 2021-06-26 DIAGNOSIS — C52 Malignant neoplasm of vagina: Secondary | ICD-10-CM

## 2021-06-26 DIAGNOSIS — Z923 Personal history of irradiation: Secondary | ICD-10-CM | POA: Diagnosis not present

## 2021-06-26 DIAGNOSIS — Z9079 Acquired absence of other genital organ(s): Secondary | ICD-10-CM | POA: Diagnosis not present

## 2021-06-26 DIAGNOSIS — Z8544 Personal history of malignant neoplasm of other female genital organs: Secondary | ICD-10-CM | POA: Diagnosis not present

## 2021-06-26 DIAGNOSIS — F319 Bipolar disorder, unspecified: Secondary | ICD-10-CM | POA: Insufficient documentation

## 2021-06-26 DIAGNOSIS — Z90721 Acquired absence of ovaries, unilateral: Secondary | ICD-10-CM | POA: Diagnosis not present

## 2021-06-26 DIAGNOSIS — Z90710 Acquired absence of both cervix and uterus: Secondary | ICD-10-CM | POA: Diagnosis not present

## 2021-06-26 NOTE — Progress Notes (Signed)
Follow-up Note: Gyn-Onc  Consult was requested by Dr. Rip Harbour for the evaluation of Jordan [name] 55 y.o. female  CC:  Chief Complaint  Patient presents with   Vaginal cancer [facility])    Assessment/Plan:  Jordan [name]  is a 55 y.o.  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 S/p adjuvant vaginal brachytherapy for positive margins on specimen. Treatment completed December, 2020.  She will return to see Dr Sondra Come in 3 months and my partner, Dr Delsa Sale, in 9 months. We will continue 3 monthly surveillance for 2 years (until December, 2022) after which we will transition to 6 monthly evaluations.  I recommend pap testing of the vagina annually in June.   She will continue to see pelvic PT for pelvic pain - no organic source of pain identified.   HPI: [name] is a 55 year old P1 who is seen in consultation at the request of Dr Rip Harbour for squamous cell carcinoma of the vulva.  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  A follow-up Pap smear in 2018 showed H SIL.  She then underwent a vaginal hysterectomy which confirmed residual CIN-3 with negative margins at the hysterectomy specimen.  She underwent a Pap test for surveillance in April 21, 2019 which revealed the AIN 2-3, H SIL.  Subsequently a vaginal colposcopy was performed on May 28, 2019.  No visible lesions were seen.  There was minimal acetowhite changes noted after acetic acid was applied that was not noted which location.  Biopsies were obtained from the vagina.  Final pathology revealed poorly differentiated squamous cell carcinoma.  The tumor cells were positive for CK 5 6, p63, and P 16.  The patient has a history of bipolar disorder for which she intermittently takes medications. .   On June 18, 2019 she underwent staging CT scan of the chest abdomen and pelvis.  This revealed that  she was status post hysterectomy with no evidence of metastatic disease in the abdomen or pelvis or chest.  There was a 6 mm right lower lobe that was grossly unchanged from a 2017 CT scan felt to be benign.  A compression fracture was seen at L2 unchanged from prior radiographs.  An MRI of the pelvis with IV contrast was performed on June 26, 2019.  It revealed susceptibility artifact in the vaginal cuff from suture material limiting evaluation.  However no focal soft tissue masses were seen involving the vaginal cuff and no abnormal soft tissue densities in the parametrial regions.  The right and left ovaries were grossly normal.  On July 22, 2019 she underwent a robotic assisted radical upper vaginectomy with bilateral pelvic sentinel lymph node biopsy, right salpingo-oophorectomy, left salpingectomy.  Intraoperative findings were significant for no grossly visible vaginal lesions.  The ovaries and fallopian tubes were adherent to the rectum and vaginal cuff in the obliterated cul-de-sac.  The ovaries themselves are grossly normal-appearing but densely adherent with no suspicious lymph nodes.  There is clinical stage I disease, microscopic.  Postoperatively final pathology returned as FIGO stage Ia microscopic poorly differentiated squamous cell carcinoma.  The tumor site was the posterior vaginal wall and measured 0.6 cm.  The posterior margin (the posterior vaginal cuff margin) was broadly positive for tumor cells.  The deep margin was negative.  There was 2.5 mm depth of invasion.  All sentinel lymph nodes were negative for  metastases, as was the left fallopian tube and right tube and ovary.  Due to positive margin status she was recommended adjuvant vaginal radiation postoperatively.  She completed adjuvant therapy with vaginal brachytherapy: Radiation Treatment Dates: 08/24/2019 through 09/22/2019 Site Technique Total Dose (Gy) Dose per Fx (Gy) Completed Fx Beam Energies  Pelvis:  Pelvis_vagina HDR-brachy 30/30 6 5/5 Ir-192   PET/CT on 09/15/20 showed soft tissue attenuating structure is noted within the left pelvis measuring 1.7 cm. This was favored to be a left ovary. Recommended MRI in 3 months for follow-up. No clear evidence for recurrence.   She had been seen at [facility] for recurrent UTI's. A CT abd/pelvis was ordered which showed findings concerning for an enlarged pelvic lymph node. Follow-up PET on 09/15/20 showed a soft tissue structure noted within the left pelvis measuring 1.7 cm.  This is consistent with the left ovary.  It was not concerning for metastatic adenopathy.  They recommended follow-up MRI to confirm stability. MRI pelvis w/wo contrast in February, 2022 to follow-up the previously seen adnexal mass showed an atrophic ovary at the vaginal cuff. No evidence of recurrence.  She had been referred to pelvic PT for chronic pelvic and vaginal pain without an apparent organic source.   Pap in June, 2022 was benign and negative for high risk HPV.  Interval Hx:  Today she presented for follow-up with no symptoms concerning for recurrence.    Current Meds:  Outpatient Encounter Medications as of 06/26/2021  Medication Sig   baclofen (LIORESAL) 10 MG tablet Take 10 mg by mouth 3 (three) times daily as needed.   Baclofen 5 MG TABS Take 1 tablet by mouth 3 (three) times daily as needed.   desvenlafaxine (PRISTIQ) 50 MG 24 hr tablet Take by mouth.   docusate sodium (COLACE) 100 MG capsule Take 1 capsule (100 mg total) by mouth every 12 (twelve) hours.   estrogens, conjugated, (PREMARIN) 0.3 MG tablet Take 1 tablet (0.3 mg total) by mouth daily. Take daily for 21 days then do not take for 7 days.   Ferrous Sulfate (IRON) 325 (65 Fe) MG TABS Take 1 tablet by mouth every other day.   ibuprofen (ADVIL) 600 MG tablet Take 1 tablet (600 mg total) by mouth every 6 (six) hours as needed.   oxybutynin (DITROPAN XL) 15 MG 24 hr tablet Take 15 mg by mouth 2 (two)  times daily.   Oxycodone HCl 10 MG TABS Take 15 mg by mouth.   Vitamin D, Ergocalciferol, (DRISDOL) 1.25 MG (50000 UNIT) CAPS capsule Take 50,000 Units by mouth. Every 14 days   No facility-administered encounter medications on file as of 06/26/2021.    Allergy:  Allergies  Allergen Reactions   Ciprofloxacin Swelling    Lips swell, tongue swells, face swells   Citalopram Other (See Comments)    Possible cause of pancytopenia. Swelling of tongue, face and throat   Lamotrigine Other (See Comments)    Possible cause of pancytopenia. Swelling of face, throat and tongue   Sulfa Antibiotics Anaphylaxis   Tramadol Anaphylaxis, Shortness Of Breath and Swelling    Social Hx:   Social History   Socioeconomic History   Marital status: Soil scientist    Spouse name: Not on file   Number of children: Not on file   Years of education: Not on file   Highest education level: Not on file  Occupational History   Not on file  Tobacco Use   Smoking status: Never   Smokeless tobacco:  Never  Vaping Use   Vaping Use: Never used  Substance and Sexual Activity   Alcohol use: Not Currently   Drug use: No   Sexual activity: Yes    Birth control/protection: Post-menopausal, Surgical    Comment: perimenopausal; no sex in years  Other Topics Concern   Not on file  Social History Narrative   Not on file   Social Determinants of Health   Financial Resource Strain: Not on file  Food Insecurity: Not on file  Transportation Needs: Not on file  Physical Activity: Not on file  Stress: Not on file  Social Connections: Not on file  Intimate Partner Violence: Not on file    Past Surgical Hx:  Past Surgical History:  Procedure Laterality Date   ABDOMINAL HYSTERECTOMY     CERVICAL CONIZATION W/BX N/A 07/01/2016   Procedure: CONIZATION CERVIX WITH BIOPSY;  Surgeon: Chancy Milroy, MD;  Location: [facility];  Service: Gynecology;  Laterality: N/A;   CHOLECYSTECTOMY     EXTERNAL FIXATION LEG Right  04/09/2020   Procedure: EXTERNAL FIXATION LEG;  Surgeon: Hiram Gash, MD;  Location: [place];  Service: Orthopedics;  Laterality: Right;   HYSTEROSCOPY WITH D & C N/A 01/25/2016   Procedure: DILATATION AND CURETTAGE /HYSTEROSCOPY;  Surgeon: Mora Bellman, MD;  Location: [facility];  Service: Gynecology;  Laterality: N/A;   LYMPH NODE BIOPSY Bilateral 07/22/2019   Procedure: LYMPH NODE BIOPSY;  Surgeon: Everitt Amber, MD;  Location: [facility];  Service: Gynecology;  Laterality: Bilateral;   OPEN REDUCTION INTERNAL FIXATION (ORIF) TIBIA/FIBULA FRACTURE Right 04/10/2020   Procedure: OPEN REDUCTION INTERNAL FIXATION (ORIF) TIBIA/FIBULA FRACTURE;  Surgeon: Shona Needles, MD;  Location: [place];  Service: Orthopedics;  Laterality: Right;   ROBOT ASSISTED MYOMECTOMY N/A 07/22/2019   Procedure: XI ROBOTIC ASSISTED LAPAROSCOPIC RADICAL UPPER VAGINECTOMY, LEFT SALPINECTOMY, RIGHT SALPINGOOOPHERECTOMY;  Surgeon: Everitt Amber, MD;  Location: [facility];  Service: Gynecology;  Laterality: N/A;   VAGINAL HYSTERECTOMY N/A 03/11/2017   Procedure: HYSTERECTOMY VAGINAL WITH MORCELLATION;  Surgeon: Chancy Milroy, MD;  Location: [facility];  Service: Gynecology;  Laterality: N/A;    Past Medical Hx:  Past Medical History:  Diagnosis Date   Alcohol abuse    Anemia    patient denies   Bipolar 1 disorder ([place])    No medications currently   CAP (community acquired pneumonia) 03/17/2015   History of radiation therapy 08/24/19-09/22/19   Vaginal brachytherapy   Dr. 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 Patient has had a hysterectomy.  Family Hx:  Family History  Problem Relation Age of  Onset   Alcohol abuse Father    Cancer Father    Breast cancer Neg Hx     Review of Systems:  Constitutional  Feels well,    ENT Normal appearing ears and nares bilaterally Skin/Breast  No rash, sores, jaundice, itching, dryness Cardiovascular  No chest pain, shortness of breath, or edema  Pulmonary  No cough or wheeze.  Gastro Intestinal  No nausea, vomitting, or diarrhoea. No bright red blood per rectum, no abdominal pain, change in bowel  movement, or constipation.  Genito Urinary  No frequency, urgency, dysuria, or discharge. Not sexually active. + vaginal pain, no bleeding.  Musculo Skeletal  No myalgia, arthralgia, joint swelling or pain  Neurologic  No weakness, numbness, change in gait,  Psychology  No depression, anxiety, insomnia.   Vitals:  Blood pressure 117/79, pulse (!) 101, temperature 98.3 F (36.8 C), temperature source Oral, resp. rate 16, weight 209 lb 3.2 oz (94.9 kg), SpO2 98 %.  Physical Exam: WD in NAD Neck  Supple NROM, without any enlargements.  Lymph Node Survey No cervical supraclavicular or inguinal adenopathy Cardiovascular  Pulse normal rate, regularity and rhythm. S1 and S2 normal.  Lungs  Clear to auscultation bilateraly, without wheezes/crackles/rhonchi. Good air movement.  Skin  No rash/lesions/breakdown  Psychiatry  Alert and oriented to person, place, and time  Abdomen  Normoactive bowel sounds, abdomen soft, non-tender and obese without evidence of hernia. Soft abdominal incisions.  Back No CVA tenderness Genito Urinary  Vulva/vagina: Normal external female genitalia.  No lesions. No discharge or bleeding.  Bladder/urethra:  No lesions or masses, well supported bladder  Vagina: vaginal cuff smooth, free of lesions or masses or blood.  Rectal  deferred Extremities  No bilateral cyanosis, clubbing or edema.   Thereasa Solo, MD  06/26/2021, 2:19 PM

## 2021-06-26 NOTE — Patient Instructions (Signed)
Please notify Dr Denman George at phone number [phone] if you notice vaginal bleeding, new pelvic or abdominal pains, bloating, feeling full easy, or a change in bladder or bowel function.   Dr Denman George is departing the [facility] in October, 2022. Her partners and colleagues including Dr Berline Lopes, Dr Delsa Sale and Joylene John, Nurse Practitioner will be available to continue your care.   You are next scheduled to return to the Gynecologic Oncology office at the [facility] in June, 2023. Please call [phone] in January to request an appointment for June with Dr Serita Grit partner, Dr Delsa Sale.

## 2021-06-28 ENCOUNTER — Telehealth: Payer: Self-pay | Admitting: *Deleted

## 2021-06-28 NOTE — Telephone Encounter (Signed)
Patient called and stated "When I was there I weighed 202 lbs and the appt before that was 232 lbs. So how much have I lost and when is my next follow up." Explained that she lost 23 lbs and her next follow up is in June with Dr Delsa Sale

## 2021-07-02 ENCOUNTER — Ambulatory Visit: Payer: Self-pay | Admitting: Radiation Oncology

## 2021-07-02 DIAGNOSIS — N3040 Irradiation cystitis without hematuria: Secondary | ICD-10-CM | POA: Insufficient documentation

## 2021-07-10 ENCOUNTER — Telehealth: Payer: Self-pay | Admitting: Physical Therapy

## 2021-07-10 ENCOUNTER — Encounter: Payer: Medicaid Other | Admitting: Physical Therapy

## 2021-07-10 NOTE — Telephone Encounter (Signed)
Spoke to patient and discussed why she is not coming to therapy. Patient reports she is bleeding and will see her urologist. Patient has canceled her appointment today.  Earlie Counts, PT @9 /20/2022@ 11:09 AM

## 2021-07-10 NOTE — Telephone Encounter (Signed)
Returned patient phone call. Left a message.  Earlie Counts, PT @9 /20/2022@ 10:55 AM

## 2021-07-16 ENCOUNTER — Ambulatory Visit: Payer: Medicaid Other | Admitting: Physical Therapy

## 2021-07-23 ENCOUNTER — Ambulatory Visit: Payer: Medicaid Other | Admitting: Obstetrics and Gynecology

## 2021-07-23 ENCOUNTER — Telehealth: Payer: Self-pay

## 2021-07-23 NOTE — Telephone Encounter (Addendum)
Patient called and requested a phone call prior to appt this afternoon. Returned pt's call. Pt states she is not feeling well and would like to reschedule. Virtual appt offered and declined by patient. Pt reports vaginal bleeding for the past month. Also states her other doctor wanted her to have a urine sample completed at today's visit. I explained that patient can come for a nurse visit to test urine if there is any concern for infection. [place] office notified to call patient to reschedule appt.

## 2021-07-24 ENCOUNTER — Telehealth: Payer: Self-pay | Admitting: Oncology

## 2021-07-24 NOTE — Telephone Encounter (Signed)
[place] called and wanted to discuss [name] leaving the practice.  Actively listened and provided psychosocial support.

## 2021-07-25 ENCOUNTER — Encounter: Payer: Medicaid Other | Admitting: Physical Therapy

## 2021-08-02 ENCOUNTER — Encounter: Payer: Self-pay | Admitting: General Practice

## 2021-08-02 NOTE — Progress Notes (Signed)
[place] Spiritual Care Note  Received voicemail from Jordan [name], sharing that her brother died after a long illness. Left return voicemail of care and concern, encouraging return call.   [place], [place], [facility] Pager [phone] Voicemail [phone]

## 2021-08-02 NOTE — Progress Notes (Signed)
[place] Spiritual Care Note  Connected with Jordan [name] by phone to provide bereavement support regarding the death of her brother. The grief is complicated by family relationships, and she didn't get to say goodbye. Provided pastoral presence, empathic listening, and normalization of feelings. This time she primarily needed to process; in the future, I may suggest creating a ritual that would help her say goodbye in her own way. [name] calls regularly, so we will talk again soon.   [place], [place], [facility] Pager [phone] Voicemail [phone]

## 2021-08-07 ENCOUNTER — Telehealth: Payer: Self-pay | Admitting: *Deleted

## 2021-08-07 NOTE — Telephone Encounter (Signed)
Spoke with Jordan [name] and discusses that pubic hair thins as we age.  Nothing is wrong.  Pt verbalized understanding.

## 2021-08-07 NOTE — Telephone Encounter (Signed)
Patient called and stated "I was a patient of Dr Denman George but she left. So I'm not sure who the new doctor is. But I have been losing hair down there for a week, what is causing that? I have not tired any new soaps or lotions." Explained that the message will be given to the [facility] APP and the office will call her back.

## 2021-08-08 ENCOUNTER — Ambulatory Visit: Payer: Medicaid Other | Admitting: Physical Therapy

## 2021-08-09 ENCOUNTER — Telehealth: Payer: Self-pay | Admitting: Obstetrics and Gynecology

## 2021-08-09 NOTE — Telephone Encounter (Signed)
Called patient and she reports that she has a lot going on and was having difficulty thinking of what she needed to say.   She is having vaginal pain all the time that has been going on for 6-7 months. She reports there is an "blood" odor that has been going on about 6-7 months also. She reports she is loosing her pubic hair in the past week. She has no burning or itching. She is not having vaginal discharge. Patient is with the same partner and not having sex.   Patient with history of vaginal cancer and has had a hysterectomy with radiation to vagina. She has been told that the pain is the result of the radiation by her Oncologist.   She is not on radiation or Chemo at this time and it has been a while.   Reviewed she can either wait for appointment on 10/31 or can come in for vaginal swab to check for BV as a nurse visit. Patient reports she would like to wait until her appointment on 10/31 to have vaginal swab done since this has been happening for several months.

## 2021-08-09 NOTE — Telephone Encounter (Signed)
Patient need a nurse to call her she said she have a lot of questions, she said she really want to speak to [place].

## 2021-08-10 ENCOUNTER — Telehealth: Payer: Self-pay | Admitting: General Practice

## 2021-08-10 NOTE — Telephone Encounter (Signed)
Patient called into front office requesting to speak with a nurse. Patient states she was supposed to give a urine sample to her urologist today due to a long history of UTI but isn't feeling well. Patient wants to know if she can give a urine sample at her appt with [name] on 10/31 and we run her urine. Told patient we can do that but she may want to check to see if her urologist is okay with that. They may want to perform the test themselves and not have her wait another 1.5 weeks to have this done. Advised she call and discuss with them. Patient verbalized understanding.

## 2021-08-13 NOTE — Telephone Encounter (Signed)
Call received from [place] at [facility]. Judson Roch states pt needs UA and culture at appt on 10/31 with Dr Rip Harbour. Advised will collect and [facility] will see results in mychart.  Colletta [place], RN

## 2021-08-20 ENCOUNTER — Other Ambulatory Visit: Payer: Self-pay

## 2021-08-20 ENCOUNTER — Ambulatory Visit (INDEPENDENT_AMBULATORY_CARE_PROVIDER_SITE_OTHER): Payer: Medicaid Other | Admitting: Obstetrics and Gynecology

## 2021-08-20 ENCOUNTER — Encounter: Payer: Self-pay | Admitting: Obstetrics and Gynecology

## 2021-08-20 VITALS — BP 101/74 | HR 103 | Ht 68.0 in | Wt 198.8 lb

## 2021-08-20 DIAGNOSIS — N951 Menopausal and female climacteric states: Secondary | ICD-10-CM

## 2021-08-20 DIAGNOSIS — N390 Urinary tract infection, site not specified: Secondary | ICD-10-CM

## 2021-08-20 NOTE — Progress Notes (Signed)
Ms Crago presents for follow up of menopausal Sx. She is not taking the Premarin. Still having menopausal Sx.  Having some UTI Sx and possible hematuria. Urologist request UC with this visit.  PE AF VSS Lungs clear  Heart RRR Abd soft + BS  GU Nl EGBUS, mild vaginal atrophy and stenotic vaginal canal, some bladder tenderness, no adnexal masses or tenderness  A/P Menopausal Sx.        Hematuria ? Discussed Tx options for menopausal Sx. Unable to take SSRI due to severe reaction to Celexa in the past. Discussed R/B of ERT. Pt states she is willing to try. UC for urology. F/U with me in 2-3 months

## 2021-08-22 ENCOUNTER — Telehealth: Payer: Self-pay | Admitting: Family Medicine

## 2021-08-22 ENCOUNTER — Telehealth: Payer: Self-pay | Admitting: *Deleted

## 2021-08-22 NOTE — Telephone Encounter (Signed)
I called Deiona and left a message I was returning her call; and I needed to give her some information- I am sending you a detailed MyChart - please read and reply or call us back. When she calls back  she needs to be notified if she is still having symptoms she will need to come in and give another urine sample. Unfortunately a urine culture was not collected 08/20/21 at her visit. Will send Mychart message.  Bay Jarquin,RN

## 2021-08-22 NOTE — Telephone Encounter (Signed)
Reba called front desk and states missed nurse call. I called Lorilyn back and explained I understand she is calling for results; but unfortunately her urine was not sent for culture and I need her to come back in and give another specimen if she is having symptoms. She confirms she is having symptoms and has been battling UTI for 16 years. She agreed to Friday am appointment. We also discussed she has a urology referral also.  Jamillia Closson,RN

## 2021-08-22 NOTE — Telephone Encounter (Signed)
Calling for results (urine)

## 2021-08-23 ENCOUNTER — Other Ambulatory Visit: Payer: Self-pay | Admitting: *Deleted

## 2021-08-23 ENCOUNTER — Ambulatory Visit: Payer: Medicaid Other

## 2021-08-23 DIAGNOSIS — N390 Urinary tract infection, site not specified: Secondary | ICD-10-CM

## 2021-08-23 LAB — POCT URINALYSIS DIP (DEVICE)
Bilirubin Urine: NEGATIVE
Glucose, UA: NEGATIVE mg/dL
Ketones, ur: NEGATIVE mg/dL
Nitrite: POSITIVE — AB
Protein, ur: NEGATIVE mg/dL
Specific Gravity, Urine: >=1.03 (ref 1.005–1.030)
Urobilinogen, UA: 2 mg/dL — ABNORMAL HIGH (ref 0.0–1.0)
pH: 6 (ref 5.0–8.0)

## 2021-08-24 ENCOUNTER — Other Ambulatory Visit: Payer: Medicaid Other

## 2021-08-27 ENCOUNTER — Other Ambulatory Visit: Payer: Self-pay

## 2021-08-27 ENCOUNTER — Ambulatory Visit (INDEPENDENT_AMBULATORY_CARE_PROVIDER_SITE_OTHER): Payer: Medicaid Other | Admitting: *Deleted

## 2021-08-27 DIAGNOSIS — N390 Urinary tract infection, site not specified: Secondary | ICD-10-CM

## 2021-08-27 DIAGNOSIS — R319 Hematuria, unspecified: Secondary | ICD-10-CM

## 2021-08-27 NOTE — Progress Notes (Signed)
Patient came in and collected clean catch urine sample, had been in on 08/20/21 and order to do culture but urine not sent- came in today just to give urine. Wynne Rozak,RN

## 2021-08-28 ENCOUNTER — Ambulatory Visit (HOSPITAL_BASED_OUTPATIENT_CLINIC_OR_DEPARTMENT_OTHER): Payer: Medicaid Other | Admitting: Radiology

## 2021-08-28 ENCOUNTER — Ambulatory Visit (HOSPITAL_BASED_OUTPATIENT_CLINIC_OR_DEPARTMENT_OTHER)
Admission: RE | Admit: 2021-08-28 | Discharge: 2021-08-28 | Disposition: A | Discharge status: Home / Self Care | Payer: Medicaid Other | Source: Ambulatory Visit | Attending: Nurse Practitioner | Admitting: Nurse Practitioner

## 2021-08-28 DIAGNOSIS — Z1231 Encounter for screening mammogram for malignant neoplasm of breast: Secondary | ICD-10-CM | POA: Diagnosis present

## 2021-08-29 ENCOUNTER — Telehealth: Payer: Self-pay | Admitting: Family Medicine

## 2021-08-29 NOTE — Telephone Encounter (Signed)
Returned patients call. She was informed that urine was sent off and the results should be back tomorrow. Patient informed she will be called if any abnormal results. Patient voiced understanding.

## 2021-08-29 NOTE — Telephone Encounter (Signed)
Patient called stating she wants to make sure her urine culture was sent off for testing from her previous visit and wanted to speak to a nurse in regards to this.

## 2021-08-31 LAB — URINE CULTURE

## 2021-09-03 ENCOUNTER — Telehealth: Payer: Self-pay

## 2021-09-03 NOTE — Telephone Encounter (Addendum)
Patient called office to ask if urine culture has resulted. Called pt back. Results given and explained that I have called Urologist to notify them of results. Explained their office will follow up with patient.

## 2021-09-03 NOTE — Telephone Encounter (Addendum)
-----   Message from Chancy Milroy, MD sent at 09/03/2021  8:44 AM EST ----- Please forward results to pt's urologist. Thanks Jane Canary [facility]. VM left for Dr. Rebekah [place] nursing staff stating urine culture results are available in Epic. Per chart review, nursing staff has been reviewing Care Everywhere frequently for these results.

## 2021-09-07 ENCOUNTER — Encounter: Payer: Self-pay | Admitting: General Practice

## 2021-09-07 NOTE — Progress Notes (Signed)
[place] Spiritual Care Note  Returned call from [place]. She used the opportunity to share and process coping with chronic vaginal pain, departure of her medical oncologist, and grief/family relationships. Provided empathic listening, normalization of feelings, and emotional support. [place] calls regularly as needed.   [place], [place], [facility] Pager [phone] Voicemail [phone]

## 2021-09-10 ENCOUNTER — Telehealth: Payer: Self-pay | Admitting: Oncology

## 2021-09-10 NOTE — Telephone Encounter (Signed)
Left a message advising that her next appointment with Gyn Oncology should be in June 2023.  We will call her when the schedule is available to make this appointment.

## 2021-09-11 ENCOUNTER — Encounter: Payer: Self-pay | Admitting: General Practice

## 2021-09-11 NOTE — Progress Notes (Signed)
[place] Spiritual Care Note  Returned call from Jordan [name], providing empathic listening, emotional support, and pastoral reflection as she shared and processed grief related to family conflict. She reaches out readily as needs arise.   [place], [place], [facility] Pager [phone] Voicemail [phone]

## 2021-09-19 ENCOUNTER — Ambulatory Visit (HOSPITAL_BASED_OUTPATIENT_CLINIC_OR_DEPARTMENT_OTHER): Payer: Medicaid Other | Admitting: Radiology

## 2021-09-25 ENCOUNTER — Telehealth: Payer: Self-pay | Admitting: *Deleted

## 2021-09-25 NOTE — Telephone Encounter (Signed)
Patient called and stated "I wanted to know when my next appt with you all is." Explained that the patient would be in June of 2023, and the office would call her once the schedule is available

## 2021-09-26 ENCOUNTER — Other Ambulatory Visit: Payer: Self-pay

## 2021-09-26 DIAGNOSIS — N3001 Acute cystitis with hematuria: Secondary | ICD-10-CM

## 2021-10-01 ENCOUNTER — Ambulatory Visit
Admission: RE | Admit: 2021-10-01 | Discharge: 2021-10-01 | Disposition: A | Discharge status: Home / Self Care | Payer: Medicaid Other | Source: Ambulatory Visit | Attending: Radiation Oncology | Admitting: Radiation Oncology

## 2021-10-01 ENCOUNTER — Encounter: Payer: Self-pay | Admitting: Radiation Oncology

## 2021-10-01 ENCOUNTER — Other Ambulatory Visit: Payer: Self-pay

## 2021-10-01 VITALS — BP 116/75 | HR 95 | Temp 97.6°F | Resp 20 | Ht 68.0 in | Wt 196.6 lb

## 2021-10-01 DIAGNOSIS — Z923 Personal history of irradiation: Secondary | ICD-10-CM | POA: Diagnosis not present

## 2021-10-01 DIAGNOSIS — N390 Urinary tract infection, site not specified: Secondary | ICD-10-CM | POA: Insufficient documentation

## 2021-10-01 DIAGNOSIS — C52 Malignant neoplasm of vagina: Secondary | ICD-10-CM | POA: Diagnosis not present

## 2021-10-01 DIAGNOSIS — N3001 Acute cystitis with hematuria: Secondary | ICD-10-CM

## 2021-10-01 DIAGNOSIS — Z79899 Other long term (current) drug therapy: Secondary | ICD-10-CM | POA: Insufficient documentation

## 2021-10-01 LAB — URINALYSIS, COMPLETE (UACMP) WITH MICROSCOPIC
Bilirubin Urine: NEGATIVE
Glucose, UA: NEGATIVE mg/dL
Ketones, ur: NEGATIVE mg/dL
Nitrite: POSITIVE — AB
Protein, ur: 100 mg/dL — AB
Specific Gravity, Urine: 1.015 (ref 1.005–1.030)
WBC, UA: >50 WBC/hpf — ABNORMAL HIGH (ref 0–5)
pH: 6 (ref 5.0–8.0)

## 2021-10-01 NOTE — Progress Notes (Signed)
Jordan [name] is here today for follow up post radiation to the pelvic.  They completed their radiation on: 09/22/19  Does the patient complain of any of the following:   Pain:10/10 Vaginal Abdominal bloating: No Diarrhea/Constipation: No Nausea/Vomiting: No Vaginal Discharge: No Blood in Urine or Stool: No Urinary Issues (dysuria/incomplete emptying/ incontinence/ increased frequency/urgency): Dysuria Does patient report using vaginal dilator 2-3 times a week and/or sexually active 2-3 weeks: No Post radiation skin changes: No   Additional comments if applicable: Vaginal pain/ burning 10/10

## 2021-10-01 NOTE — Progress Notes (Signed)
Radiation Oncology         [phone] ________________________________  Name: Jordan [name] MRN: [id number]  Date: 10/01/2021  DOB: 08/17/1966  Follow-Up Visit Note  CC: [name], NP  [name], NP    ICD-10-CM   1. 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  The patient was last seen here for follow up on 06/04/21. Since then, the patient has continued to see pelvic PT for her pelvic pain.   The patient followed up with [name] on 06/26/21. During which time, the patient reported no symptoms concerning for disease recurrence, and was NED on examination.   Of note: the patient presented to her urologist, Dr. Amalia Hailey, on 07/02/21, with recurrent UTI symptoms. The patient was scheduled for cystoscopy on this date, however, given a recent positive urine culture, the patient required treatment prior to cytoscopy.   The patient also followed up with her OBGYN, Dr. Rip Harbour, on 08/20/21 in regards to her menopausal symptoms. During which time, the patient also reported UTI symptoms and hematuria. Physical exam performed during this visit revealed mild vaginal atrophy, a stenotic vaginal canal, some bladder tenderness, and no adnexal masses or tenderness. A urine culture was also obtained during this visit for her persistent UTI Sx.  Pertinent imaging since the patient was last seen for follow up includes;  --bilateral screening mammogram on 08/28/21 which revealed no mammographic evidence of malignancy in either breast.   The patient has to be seen for pain in the  vaginal area as well as significant urinary symptoms suspicious for bladder infection.  She reports not having her cystoscopy with Dr. Amalia Hailey.  This is been postponed until May of next year.  She denies any vaginal bleeding.  She is not using her vaginal dilator anymore.  She denies any hematuria.  Reports 10 out of 10 pain with urination.                            Allergies:  is allergic to ciprofloxacin, citalopram, lamotrigine, sulfa antibiotics, and tramadol.  Meds: Current Outpatient Medications  Medication Sig Dispense Refill   baclofen (LIORESAL) 10 MG tablet Take 10 mg by mouth 3 (three) times daily as needed.     desvenlafaxine (PRISTIQ) 50 MG 24 hr tablet Take by mouth. (Patient not taking: Reported on 08/20/2021)     docusate sodium (COLACE) 100 MG capsule Take 1 capsule (100 mg total) by mouth every 12 (twelve) hours. (Patient not taking: Reported on 08/20/2021) 60 capsule 0   estrogens, conjugated, (PREMARIN) 0.3 MG tablet Take 1 tablet (0.3 mg total) by mouth daily. Take daily for 21 days then do not take for 7 days. (Patient not taking: Reported on 08/20/2021) 30 tablet 5   Ferrous Sulfate (IRON) 325 (65 Fe) MG TABS Take 1 tablet by mouth every other day.     ibuprofen (ADVIL) 600 MG tablet Take 1 tablet (600 mg total) by mouth every 6 (six) hours as needed. (Patient not taking:  Reported on 08/20/2021) 30 tablet 0   oxybutynin (DITROPAN XL) 15 MG 24 hr tablet Take 15 mg by mouth 2 (two) times daily.     Oxycodone HCl 10 MG TABS Take 15 mg by mouth.     Vitamin D, Ergocalciferol, (DRISDOL) 1.25 MG (50000 UNIT) CAPS capsule Take 50,000 Units by mouth. Every 14 days     No current facility-administered medications for this encounter.    Physical Findings: The patient is in no acute distress. Patient is alert and oriented.  height is 5\' 8"  (1.727 m) and weight is 196 lb 9.6 oz (89.2 kg). Her oral temperature is 97.6 F (36.4 C). Her blood pressure is 116/75 and her pulse is 95.  Her respiration is 20 and oxygen saturation is 98%. .  No significant changes. Lungs are clear to auscultation bilaterally. Heart has regular rate and rhythm. No palpable cervical, supraclavicular, or axillary adenopathy. Abdomen soft, non-tender, normal bowel sounds.  On pelvic examination the external genitalia were unremarkable. A speculum exam was performed. There are no mucosal lesions noted in the vaginal vault.  A small speculum could be used for the examination but could not be opened.  A pediatric speculum speculum was used which the patient tolerated better and allow for better view of the vaginal mucosa.  Patient was able to tolerate a digital exam.  On bimanual examination there are no pelvic masses appreciated.  Proximal vagina intact.   Lab Findings: Lab Results  Component Value Date   WBC 3.4 (L) 01/24/2021   HGB 15.4 (H) 01/24/2021   HCT 44.4 01/24/2021   MCV 96.3 01/24/2021   PLT 150 01/24/2021    Radiographic Findings: No results found.  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  Patient reports persistent pain in the vaginal region.  As above she has significant urinary symptoms.  Plan: Prior to exam today,  the patient presented to the lab for urinalysis culture and sensitivity.  If this shows multiple species and no consistent findings of an infection she likely will require an In-N-Out cath for better specimen.  Patient is now 2 years out from her treatment and can be seen at 65-month intervals.  She will see Dr. Berline Lopes in 6 months.  Routine follow-up in radiation oncology in 1 year.  She will proceed with cystoscopy in May of next year with Dr. Amalia Hailey at [facility].   25 minutes of total time was spent for this patient encounter, including preparation, face-to-face counseling with the patient and coordination of care, physical exam, and documentation of the  encounter. ____________________________________  Blair Promise, PhD, MD   This document serves as a record of services personally performed by Gery Pray, MD. It was created on his behalf by Roney Mans, a trained medical scribe. The creation of this record is based on the scribe's personal observations and the provider's statements to them. This document has been checked and approved by the attending provider.

## 2021-10-02 ENCOUNTER — Telehealth: Payer: Self-pay

## 2021-10-02 NOTE — Telephone Encounter (Signed)
Received call from [place] requesting to schedule her follow up appointment. She has been scheduled for 04/03/22 with Dr. Delsa Sale. Patient is in agreement of appointment date and time. Appointment details will be mailed to patient as per her request.

## 2021-10-04 ENCOUNTER — Other Ambulatory Visit: Payer: Self-pay | Admitting: Radiation Oncology

## 2021-10-04 ENCOUNTER — Other Ambulatory Visit: Payer: Self-pay | Admitting: Radiology

## 2021-10-04 ENCOUNTER — Other Ambulatory Visit (HOSPITAL_COMMUNITY): Payer: Self-pay

## 2021-10-04 DIAGNOSIS — C52 Malignant neoplasm of vagina: Secondary | ICD-10-CM

## 2021-10-04 DIAGNOSIS — N3001 Acute cystitis with hematuria: Secondary | ICD-10-CM

## 2021-10-04 LAB — URINE CULTURE: Culture: >=100000 — AB

## 2021-10-04 MED ORDER — CEPHALEXIN 500 MG PO CAPS: 500.0000 mg | ORAL_CAPSULE | Freq: Two times a day (BID) | ORAL | 0 refills | Status: DC

## 2021-10-05 ENCOUNTER — Telehealth: Payer: Self-pay

## 2021-10-05 ENCOUNTER — Ambulatory Visit: Payer: Medicaid Other

## 2021-10-05 ENCOUNTER — Other Ambulatory Visit: Payer: Medicaid Other

## 2021-10-05 ENCOUNTER — Encounter: Payer: Self-pay | Admitting: General Practice

## 2021-10-05 NOTE — Progress Notes (Signed)
[place] Spiritual Care Note  Received call from [place] for pastoral check-in. She is overall managing ok, but continues to struggle with pain and burning in her vaginal/vulvar area due to UTI and related issues. She values pastoral check-ins and opportunity to process feelings and stressors, and plans to call again as needed.   [place], [place], [facility] Pager [phone] Voicemail [phone]

## 2021-10-05 NOTE — Telephone Encounter (Signed)
Called and made patient aware of new appointment time. 10/08/21 @ 730am. Patient agreed. Transportation has been arranged.

## 2021-10-05 NOTE — Telephone Encounter (Signed)
Patient called in to report that she needs to  cancel her antibiotic infusion today. Educated patient on the importance of receiving antibiotics in a timely manner. Patient voiced understanding however patient remained adamant about not coming in today. Patient is requesting appointment be changed to Monday 12/19. Patient also reports cancelling [place] transportation for today.

## 2021-10-08 ENCOUNTER — Other Ambulatory Visit: Payer: Self-pay | Admitting: Radiology

## 2021-10-08 ENCOUNTER — Telehealth: Payer: Self-pay

## 2021-10-08 ENCOUNTER — Inpatient Hospital Stay: Payer: Medicaid Other | Attending: Radiation Oncology

## 2021-10-08 ENCOUNTER — Other Ambulatory Visit: Payer: Self-pay

## 2021-10-08 ENCOUNTER — Inpatient Hospital Stay: Payer: Medicaid Other

## 2021-10-08 ENCOUNTER — Ambulatory Visit: Payer: Medicaid Other

## 2021-10-08 VITALS — BP 126/78 | HR 97 | Temp 97.7°F | Resp 20

## 2021-10-08 DIAGNOSIS — N3001 Acute cystitis with hematuria: Secondary | ICD-10-CM | POA: Insufficient documentation

## 2021-10-08 DIAGNOSIS — C52 Malignant neoplasm of vagina: Secondary | ICD-10-CM

## 2021-10-08 LAB — CBC WITH DIFFERENTIAL (CANCER CENTER ONLY)
Abs Immature Granulocytes: 0.01 10*3/uL (ref 0.00–0.07)
Basophils Absolute: 0 10*3/uL (ref 0.0–0.1)
Basophils Relative: 0 %
Eosinophils Absolute: 0 10*3/uL (ref 0.0–0.5)
Eosinophils Relative: 0 %
HCT: 40.9 % (ref 36.0–46.0)
Hemoglobin: 14.4 g/dL (ref 12.0–15.0)
Immature Granulocytes: 0 %
Lymphocytes Relative: 26 %
Lymphs Abs: 1.2 10*3/uL (ref 0.7–4.0)
MCH: 33.8 pg (ref 26.0–34.0)
MCHC: 35.2 g/dL (ref 30.0–36.0)
MCV: 96 fL (ref 80.0–100.0)
Monocytes Absolute: 0.3 10*3/uL (ref 0.1–1.0)
Monocytes Relative: 7 %
Neutro Abs: 3.1 10*3/uL (ref 1.7–7.7)
Neutrophils Relative %: 67 %
Platelet Count: 126 10*3/uL — ABNORMAL LOW (ref 150–400)
RBC: 4.26 MIL/uL (ref 3.87–5.11)
RDW: 14.1 % (ref 11.5–15.5)
WBC Count: 4.6 10*3/uL (ref 4.0–10.5)
nRBC: 0 % (ref 0.0–0.2)

## 2021-10-08 LAB — BUN & CREATININE (CHCC)
BUN: 15 mg/dL (ref 6–20)
Creatinine: 0.71 mg/dL (ref 0.44–1.00)
GFR, Estimated: >60 mL/min (ref >60)

## 2021-10-08 MED ORDER — GENTAMICIN IV (FOR PTA / DISCHARGE USE ONLY): 360.0000 mg | Freq: Once | INTRAVENOUS | Status: DC

## 2021-10-08 MED ORDER — GENTAMICIN SULFATE 40 MG/ML IJ SOLN
5.0000 mg/kg | Freq: Once | INTRAVENOUS | Status: AC
  Administered 2021-10-08: 15:00:00 370 mg via INTRAVENOUS
  Filled 2021-10-08: qty 9.25

## 2021-10-08 NOTE — Telephone Encounter (Signed)
Called patient to make aware of appointment today at 130 pm for labs and 2 pm for infusion.  patient aware that transportation has been arranged. Patient voiced understanding.

## 2021-10-08 NOTE — Patient Instructions (Signed)
Gentamicin solution for injection What is this medication? GENTAMICIN (jen ta MYE sin) is an aminoglycoside antibiotic. It is used to treat certain kinds of bacterial infections. It will not work for colds, flu, or other viral infections. This medicine may be used for other purposes; ask your health care provider or pharmacist if you have questions. COMMON BRAND NAME(S): Garamycin What should I tell my care team before I take this medication? They need to know if you have any of these conditions: balance problems hearing problems kidney disease myasthenia gravis Parkinson's disease an unusual or allergic reaction to gentamicin, aminoglycosides, other medicines, sulfites, foods, dyes or preservatives pregnant or trying to get pregnant breast-feeding How should I use this medication? This medicine is for injection into a muscle or infusion into a vein. It is usually given by a health care professional in a hospital or clinic setting. If you get this medicine at home, you will be taught how to prepare and give this medicine. Use exactly as directed. Take your medicine at regular intervals. Do not take your medicine more often than directed. Take all of your medicine as directed even if you think you are better. Do not skip doses or stop your medicine early. It is important that you put your used needles and syringes in a special sharps container. Do not put them in a trash can. If you do not have a sharps container, call your pharmacist or healthcare provider to get one. Talk to your pediatrician regarding the use of this medicine in children. Special care may be needed. Overdosage: If you think you have taken too much of this medicine contact a poison control center or emergency room at once. NOTE: This medicine is only for you. Do not share this medicine with others. What if I miss a dose? If you miss a dose, use it as soon as you can. If it is almost time for your next dose, use only that  dose. Do not use double or extra doses. What may interact with this medication? Do not take this medicine with any of the following medications: cidofovir This medicine may also interact with the following medications: acyclovir birth control pills cisplatin colistin cyclosporine diuretics foscarnet ganciclovir medicines used during surgery for sleep or muscle relaxation other antibiotics This list may not describe all possible interactions. Give your health care provider a list of all the medicines, herbs, non-prescription drugs, or dietary supplements you use. Also tell them if you smoke, drink alcohol, or use illegal drugs. Some items may interact with your medicine. What should I watch for while using this medication? Your condition will be monitored carefully while you are receiving this medicine. Tell your doctor or health care professional if you have any hearing problems or problems passing urine. This medicine may cause a decrease in vitamin B6 and vitamin B12. You should make sure that you get enough vitamin B6 and vitamin B12 while you are taking this medicine. Discuss the foods you eat and the vitamins you take with your health care professional. What side effects may I notice from receiving this medication? Side effects that you should report to your doctor or health care professional as soon as possible: allergic reactions like skin rash, itching or hives, swelling of the face, lips, or tongue breathing problems changes in hearing confused, dizzy, disoriented fever loss of balance muscle twitch numb, tingling pain redness, blistering, peeling or loosening of the skin, including inside the mouth seizures trouble passing urine or change in the  amount of urine unusual bleeding or bruising unusually weak or tired Side effects that usually do not require medical attention (report to your doctor or health care professional if they continue or are  bothersome): diarrhea headache nausea, vomiting pain at site where injected This list may not describe all possible side effects. Call your doctor for medical advice about side effects. You may report side effects to FDA at 1-800-FDA-1088. Where should I keep my medication? Keep out of the reach of children. If you are using this medicine at home, you will be instructed on how to store this medicine. Throw away any unused medicine after the expiration date on the label. NOTE: This sheet is a summary. It may not cover all possible information. If you have questions about this medicine, talk to your doctor, pharmacist, or health care provider.  2022 Elsevier/Gold Standard (2017-06-10 00:00:00)

## 2021-10-09 ENCOUNTER — Encounter: Payer: Self-pay | Admitting: General Practice

## 2021-10-09 NOTE — Progress Notes (Signed)
[place] Spiritual Care Note  Returned call from [name], who is processing her boyfriend's illness, hospitalization, and mortality. She benefited from having a pastoral presence and listener as she shared her concerns and vulnerability. Jordan [name] reaches out regularly as needed.   [place], [place], [facility] Pager [phone] Voicemail [phone]

## 2021-10-26 ENCOUNTER — Encounter: Payer: Self-pay | Admitting: General Practice

## 2021-10-26 NOTE — Progress Notes (Signed)
[place] Spiritual Care Note  Received and returned voicemails from [place], who calls regularly when needed. Encouraged return call at a convenient time.   [place], [place], [facility] Pager [phone] Voicemail [phone]

## 2021-11-12 ENCOUNTER — Telehealth: Payer: Self-pay | Admitting: Oncology

## 2021-11-12 NOTE — Telephone Encounter (Signed)
Jordan [name] called to review upcoming appointements.  She asked if the appointment with Dr. Delsa Sale can be rescheduled sooner.  She said she does not have any issues right now.  Advised her that she needs to have follow ups every 6 months but that I will check to see if it can be moved up sooner.

## 2021-11-13 ENCOUNTER — Encounter: Payer: Self-pay | Admitting: General Practice

## 2021-11-13 NOTE — Progress Notes (Signed)
[place] Spiritual Care Note  Received and returned voicemail from [place]. She calls regularly as needed.   [place], [place], [facility] Pager [phone] Voicemail [phone]

## 2021-11-26 ENCOUNTER — Ambulatory Visit: Payer: Medicaid Other | Admitting: Obstetrics and Gynecology

## 2021-11-26 ENCOUNTER — Encounter: Payer: Self-pay | Admitting: General Practice

## 2021-11-26 NOTE — Progress Notes (Signed)
[place] Spiritual Care Note  Received and returned voicemail from [employer], encouraging return call.   [place], [place], [facility] Pager [phone] Voicemail [phone]

## 2021-12-07 ENCOUNTER — Telehealth: Payer: Self-pay

## 2021-12-07 NOTE — Telephone Encounter (Signed)
Received call from [place] this afternoon. Patient reports burning with urination for about a month. She has tried increasing fluids but burning has persisted. She denies fever or chills. Denies vaginal bleeding or discharge. Denies pain(abdomen and back). Endorses odorous urine. Patient states she has tried to get in with Dr. Amalia Hailey but can't see him until August. Advised patient that it appears she has an appointment with the urology office on 12/12/21. Patient does not want to go to this since she won't be seeing Dr. Amalia Hailey.  Patient reports going to urgent care the other day but was told there was a two hour wait time and was not able to stay due to transportation issues. Advised patient to keep appointment with urology to be evaluated. If symptoms worsen before then to contact her PCP or go to urgent care. Patient verbalized understanding.

## 2021-12-13 ENCOUNTER — Telehealth: Payer: Self-pay

## 2021-12-13 ENCOUNTER — Telehealth: Payer: Self-pay | Admitting: *Deleted

## 2021-12-13 NOTE — Telephone Encounter (Signed)
RETURNED PATIENT'S PHONE CALL, SPOKE WITH PATIENT. ?

## 2021-12-13 NOTE — Telephone Encounter (Signed)
Patient called in to report that she is having dysuria and a strong vaginal odor. Patient denies fever or other symptoms. Patient is unable to come in today for urinalysis.

## 2021-12-17 ENCOUNTER — Other Ambulatory Visit: Payer: Self-pay

## 2021-12-17 ENCOUNTER — Other Ambulatory Visit: Payer: Self-pay | Admitting: Radiology

## 2021-12-17 ENCOUNTER — Ambulatory Visit
Admission: RE | Admit: 2021-12-17 | Discharge: 2021-12-17 | Disposition: A | Discharge status: Home / Self Care | Payer: Medicaid Other | Source: Ambulatory Visit | Attending: Radiation Oncology | Admitting: Radiation Oncology

## 2021-12-17 DIAGNOSIS — R300 Dysuria: Secondary | ICD-10-CM

## 2021-12-17 DIAGNOSIS — Z8589 Personal history of malignant neoplasm of other organs and systems: Secondary | ICD-10-CM | POA: Insufficient documentation

## 2021-12-17 LAB — URINALYSIS, COMPLETE (UACMP) WITH MICROSCOPIC
Bilirubin Urine: NEGATIVE
Glucose, UA: NEGATIVE mg/dL
Ketones, ur: NEGATIVE mg/dL
Nitrite: POSITIVE — AB
Protein, ur: NEGATIVE mg/dL
Specific Gravity, Urine: 1.021 (ref 1.005–1.030)
pH: 5 (ref 5.0–8.0)

## 2021-12-19 ENCOUNTER — Other Ambulatory Visit: Payer: Self-pay | Admitting: Radiation Oncology

## 2021-12-19 LAB — URINE CULTURE: Culture: >=100000 — AB

## 2021-12-19 MED ORDER — CEPHALEXIN 500 MG PO CAPS: 500.0000 mg | ORAL_CAPSULE | Freq: Two times a day (BID) | ORAL | 0 refills | Status: DC

## 2022-01-20 NOTE — Progress Notes (Incomplete)
?Radiation Oncology         [phone] ?________________________________ ? ?Name: Jordan [name] MRN: [id number]  ?Date: 01/21/2022  DOB: 01-Aug-1966 ? ?Follow-Up Visit Note ? ?CC: [name], NP  [name], NP ? ?No diagnosis found. ? 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 ?Interval Since Last Radiation: 4 months and 1 day  ? ?Radiation Treatment Dates: 08/24/2019 through 09/22/2019 ?Site Technique Total Dose (Gy) Dose per Fx (Gy) Completed Fx Beam Energies  ?Pelvis: Pelvis_vagina HDR-brachy 30/30 6 5/5 Ir-192  ? ? ?Narrative:  The patient returns today for routine follow-up, she was last seen here for follow-up on 10/01/21.  ? ?No significant interval history since the patient was last seen.  ? ?*** ? ?Allergies:  is allergic to ciprofloxacin, citalopram, lamotrigine, sulfa antibiotics, and tramadol. ? ?Meds: ?Current Outpatient Medications  ?Medication Sig Dispense Refill  ? baclofen (LIORESAL) 10 MG tablet Take 10 mg by mouth 3 (three) times daily as needed.    ? cephALEXin (KEFLEX) 500 MG capsule Take 1 capsule (500 mg total) by mouth 2 (two) times daily. 14 capsule 0  ? desvenlafaxine (PRISTIQ) 50 MG 24 hr tablet Take by mouth. (Patient not taking: Reported on 08/20/2021)    ? docusate sodium (COLACE) 100 MG capsule Take 1 capsule (100 mg total) by mouth every 12 (twelve) hours. (Patient not taking: Reported on 08/20/2021) 60 capsule 0  ? estrogens, conjugated, (PREMARIN) 0.3 MG tablet Take 1 tablet (0.3 mg total) by mouth daily. Take daily for 21 days then do not take for 7 days. (Patient not taking: Reported on 08/20/2021) 30 tablet 5  ? Ferrous Sulfate (IRON) 325 (65 Fe) MG TABS Take 1 tablet by mouth every other day.    ? ibuprofen (ADVIL) 600 MG tablet Take 1 tablet (600 mg total) by mouth every 6 (six) hours as needed. (Patient not taking: Reported on 08/20/2021) 30 tablet 0  ? oxybutynin (DITROPAN XL) 15 MG 24 hr tablet Take 15 mg by mouth 2 (two) times daily.     ? Oxycodone HCl 10 MG TABS Take 15 mg by mouth.    ? Vitamin D, Ergocalciferol, (DRISDOL) 1.25 MG (50000 UNIT) CAPS capsule Take 50,000 Units by mouth. Every 14 days    ? ?No current facility-administered medications for this encounter.  ? ? ?Physical Findings: ?The patient is in no acute distress. Patient is alert and oriented. ? vitals were not taken for this visit. .  No significant changes. Lungs are clear to auscultation bilaterally. Heart has regular rate and rhythm. No palpable cervical, supraclavicular, or axillary adenopathy. Abdomen soft, non-tender, normal bowel sounds. ? ?On pelvic examination the external genitalia were unremarkable. A speculum exam was performed. There are no mucosal lesions noted in the vaginal vault. A Pap smear was obtained of the proximal vagina. On bimanual and rectovaginal examination there were no pelvic masses appreciated. *** ? ? ?Lab Findings: ?Lab Results  ?Component Value Date  ? WBC 4.6 10/08/2021  ? HGB 14.4 10/08/2021  ? HCT 40.9 10/08/2021  ? MCV 96.0 10/08/2021  ? PLT 126 (L) 10/08/2021  ? ? ?Radiographic Findings: ?No results found. ? 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 ?The patient is recovering from the effects of radiation.  *** ? ?Plan:  *** ? ? ?*** minutes of total time was spent for this patient encounter, including preparation, face-to-face counseling with the patient and coordination of care, physical exam,  and documentation of the encounter. ?____________________________________ ? ?Blair Promise, PhD, MD ? ? ?This document serves as a record of services personally performed by Gery Pray, MD. It was created on his behalf by Roney Mans, a trained medical scribe. The creation of this record is based on the scribe's personal observations and the provider's statements to them. This document has been checked and approved by the attending provider. ? ?

## 2022-01-21 ENCOUNTER — Ambulatory Visit
Admission: RE | Admit: 2022-01-21 | Discharge: 2022-01-21 | Disposition: A | Discharge status: Home / Self Care | Payer: Medicaid Other | Source: Ambulatory Visit | Attending: Radiation Oncology | Admitting: Radiation Oncology

## 2022-01-21 ENCOUNTER — Telehealth: Payer: Self-pay | Admitting: *Deleted

## 2022-01-21 DIAGNOSIS — Z8589 Personal history of malignant neoplasm of other organs and systems: Secondary | ICD-10-CM | POA: Insufficient documentation

## 2022-01-21 DIAGNOSIS — R300 Dysuria: Secondary | ICD-10-CM | POA: Insufficient documentation

## 2022-01-21 NOTE — Telephone Encounter (Signed)
CALLED PATIENT TO ASK ABOUT RESCHEDULING TODAY'S FU, LVM FOR A RETURN CALL ?

## 2022-01-28 ENCOUNTER — Ambulatory Visit: Payer: Medicaid Other | Admitting: Obstetrics and Gynecology

## 2022-02-20 ENCOUNTER — Encounter: Payer: Self-pay | Admitting: General Practice

## 2022-02-20 NOTE — Progress Notes (Signed)
[place] Spiritual Care Note ? ?Returned call from [place], learning that Merry Proud, her partner of 17 years, died this morning at [facility]. She is understandably sad and tearful--and reaching out for support, which is a big strength. Will continue to follow for emotional support. ? ? ?Chaplain [name], [place], [facility] ?Pager [phone] ?Voicemail [phone]  ?

## 2022-03-13 ENCOUNTER — Ambulatory Visit
Admission: RE | Admit: 2022-03-13 | Discharge: 2022-03-13 | Disposition: A | Discharge status: Home / Self Care | Payer: Medicaid Other | Source: Ambulatory Visit | Attending: Radiation Oncology | Admitting: Radiation Oncology

## 2022-03-13 ENCOUNTER — Other Ambulatory Visit: Payer: Self-pay

## 2022-03-13 ENCOUNTER — Telehealth: Payer: Self-pay

## 2022-03-13 ENCOUNTER — Encounter: Payer: Self-pay | Admitting: Radiation Oncology

## 2022-03-13 ENCOUNTER — Inpatient Hospital Stay: Payer: Medicaid Other | Attending: Radiation Oncology

## 2022-03-13 VITALS — BP 119/75 | HR 86 | Temp 97.3°F | Resp 18 | Ht 68.0 in | Wt 218.2 lb

## 2022-03-13 DIAGNOSIS — C52 Malignant neoplasm of vagina: Secondary | ICD-10-CM

## 2022-03-13 DIAGNOSIS — Z79899 Other long term (current) drug therapy: Secondary | ICD-10-CM | POA: Diagnosis not present

## 2022-03-13 DIAGNOSIS — Z8589 Personal history of malignant neoplasm of other organs and systems: Secondary | ICD-10-CM | POA: Insufficient documentation

## 2022-03-13 LAB — URINALYSIS, COMPLETE (UACMP) WITH MICROSCOPIC
Bilirubin Urine: NEGATIVE
Glucose, UA: NEGATIVE mg/dL
Ketones, ur: NEGATIVE mg/dL
Nitrite: POSITIVE — AB
Protein, ur: NEGATIVE mg/dL
Specific Gravity, Urine: 1.023 (ref 1.005–1.030)
pH: 5 (ref 5.0–8.0)

## 2022-03-13 NOTE — Progress Notes (Signed)
Radiation Oncology         (336) [phone] ________________________________  Name: Jordan [name] MRN: [id number]  Date: 03/13/2022  DOB: Apr 16, 1966  Follow-Up Visit Note  CC: [name], NP  [name], NP    ICD-10-CM   1. 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HCC)  C52 Urinalysis, Complete w Microscopic    Urine culture    2. Vaginal cancer ([place])  C52       Diagnosis: FIGO Stage I (pT1a, pN0) Squamous Cell Carcinoma of the Vagina  Interval Since Last Radiation: 2 years, 5 months, and 22 days   Radiation Treatment Dates: 08/24/2019 through 09/22/2019 Site Technique Total Dose (Gy) Dose per Fx (Gy) Completed Fx Beam Energies  Pelvis: Pelvis_vagina HDR-brachy 30/30 6 5/5 Ir-192    Narrative:  The patient returns today for routine follow-up, she was last seen here for follow up on 10/01/21. Since her last visit, the patient called our office on 12/13/21 with complaints of dysuria and a strong vaginal odor. Urine culture collected on 12/17/21 showed e.coli and she was sent in a prescription for Keflex  She called in earlier today with recurrence of her dysuria and vaginal odor and requested to be seen today for evaluation.      Otherwise, no significant interval history since the patient was last seen, however she does report her boyfriend of 17 years passed few weeks ago.  She has not been seen by gynecologic oncology or radiation oncology since late last year.                          Allergies:  is allergic to ciprofloxacin, citalopram, lamotrigine, sulfa antibiotics, and tramadol.  Meds: Current Outpatient Medications  Medication Sig Dispense Refill   baclofen (LIORESAL) 10 MG tablet Take 10 mg by mouth 3 (three) times daily as needed.     oxybutynin (DITROPAN XL) 15 MG 24 hr tablet Take 15 mg by mouth 2 (two) times daily.     Oxycodone HCl 10 MG TABS Take 15 mg by mouth.     Vitamin D, Ergocalciferol, (DRISDOL) 1.25 MG (50000 UNIT) CAPS capsule Take 50,000 Units by mouth.  Every 14 days     desvenlafaxine (PRISTIQ) 50 MG 24 hr tablet Take by mouth. (Patient not taking: Reported on 08/20/2021)     docusate sodium (COLACE) 100 MG capsule Take 1 capsule (100 mg total) by mouth every 12 (twelve) hours. (Patient not taking: Reported on 08/20/2021) 60 capsule 0   estrogens, conjugated, (PREMARIN) 0.3 MG tablet Take 1 tablet (0.3 mg total) by mouth daily. Take daily for 21 days then do not take for 7 days. (Patient not taking: Reported on 08/20/2021) 30 tablet 5   Ferrous Sulfate (IRON) 325 (65 Fe) MG TABS Take 1 tablet by mouth every other day. (Patient not taking: Reported on 03/13/2022)     ibuprofen (ADVIL) 600 MG tablet Take 1 tablet (600 mg total) by mouth every 6 (six) hours as needed. (Patient not taking: Reported on 08/20/2021) 30 tablet 0   No current facility-administered medications for this encounter.    Physical Findings: The patient is in no acute distress. Patient is alert and oriented.  height is '5\' 8"'$  (1.727 m) and weight is 218 lb 3.2 oz (99 kg). Her temporal temperature is 97.3 F (36.3 C) (abnormal). Her blood pressure is 119/75 and her pulse is 86. Her respiration is 18 and oxygen saturation is 98%. .  No significant changes. Lungs are  clear to auscultation bilaterally. Heart has regular rate and rhythm. No palpable cervical, supraclavicular, or axillary adenopathy. Abdomen soft, non-tender, normal bowel sounds.  On pelvic examination the external genitalia were unremarkable. A speculum exam was performed. There are no mucosal lesions noted in the vaginal vault.  She tolerated the pelvic exam much better today.  A small speculum could be easily used on exam today.  On bimanual examination there are no pelvic masses appreciated.  Upper vaginal vault intact.  No palpable lower pelvic masses appreciable.  She would not permit a rectal exam.   Lab Findings: Lab Results  Component Value Date   WBC 4.6 10/08/2021   HGB 14.4 10/08/2021   HCT 40.9  10/08/2021   MCV 96.0 10/08/2021   PLT 126 (L) 10/08/2021    Radiographic Findings: No results found.  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  Urine specimen is suspicious for another bladder infection.  Will await results of the culture before antibiotics as she has had some very unusual bladder infections in the past.  Plan: Recommended she cancel her appointment with Dr. Delsa Sale next month since I saw the patient today.  Gynecologic oncology can see the patient in November of this year and I will see the patient in 1 year for follow-up.  She continues to follow-up with her regular gynecologist in addition.   25 minutes of total time was spent for this patient encounter, including preparation, face-to-face counseling with the patient and coordination of care, physical exam, and documentation of the encounter. ____________________________________  Blair Promise, PhD, MD  This document serves as a record of services personally performed by Gery Pray, MD. It was created on his behalf by Roney Mans, a trained medical scribe. The creation of this record is based on the scribe's personal observations and the provider's statements to them. This document has been checked and approved by the attending provider.

## 2022-03-13 NOTE — Progress Notes (Signed)
Jordan [name] is here today for follow up post radiation to the pelvic.  They completed their radiation on:  09/22/19  Does the patient complain of any of the following:  Pain: Patient reports only having dysuria.  Abdominal bloating: No Diarrhea/Constipation: No Nausea/Vomiting: No Vaginal Discharge: No, patient reports having a strong vaginal odor.  Blood in Urine or Stool: No Urinary Issues (dysuria/incomplete emptying/ incontinence/ increased frequency/urgency): dysuria, urgency and frequency.  Does patient report using vaginal dilator 2-3 times a week and/or sexually active 2-3 weeks: No Post radiation skin changes: No   Additional comments if applicable:  BP 063/86 (BP Location: Left Arm, Patient Position: Sitting)   Pulse 86   Temp (!) 97.3 F (36.3 C) (Temporal)   Resp 18   Ht '5\' 8"'$  (1.727 m)   Wt 218 lb 3.2 oz (99 kg)   LMP  (LMP Unknown)   SpO2 98%   BMI 33.18 kg/m

## 2022-03-13 NOTE — Telephone Encounter (Signed)
Received call from patient stating she is have a strong vaginal odor and increased dysuria. Per patient she is staying well hydrated, however continues have odor. Patient denies any vaginal discharge. Patient is requesting an appointment with Dr. Sondra Come.

## 2022-03-15 ENCOUNTER — Other Ambulatory Visit: Payer: Self-pay | Admitting: Radiation Oncology

## 2022-03-15 LAB — URINE CULTURE: Culture: >=100000 — AB

## 2022-03-15 MED ORDER — NITROFURANTOIN MONOHYD MACRO 100 MG PO CAPS: 100.0000 mg | ORAL_CAPSULE | Freq: Two times a day (BID) | ORAL | 0 refills | Status: DC

## 2022-03-25 ENCOUNTER — Encounter: Payer: Self-pay | Admitting: General Practice

## 2022-03-25 NOTE — Progress Notes (Signed)
[place] Spiritual Care Note  Received and returned voicemail from [place]. Will keep trying if needed to make contact.   [place], [place], [facility] Pager [phone] Voicemail [phone]

## 2022-03-25 NOTE — Progress Notes (Signed)
[place] Spiritual Care Note  Spoke with Jordan [name] by phone to provide emotional and spiritual support as she continues to process her partner Jordan [name]'s death, as well as nervousness about upcoming cataract surgery. She reports that she is still working with her counselor, who has been an important source of support through this process. One of Jordan [name]'s legacies to Jordan [name] is his words of encouragement and confidence in her ability to manage anything that she must, including both past and future surgeries.  Jordan [name] also plans to return to [employer] in July for further tutoring, which is a source of empowerment and pride.  Provided empathic listening, emotional support, pastoral reflection, and affirmation of strengths. Jordan [name]  plans to reach out again as needed.   [place], [place], [facility] Pager [phone] Voicemail [phone]

## 2022-03-29 ENCOUNTER — Encounter: Payer: Self-pay | Admitting: General Practice

## 2022-03-29 NOTE — Progress Notes (Signed)
[place] Spiritual Care Note  Received call from [place] to process upcoming cancer checkup and eye surgery. She is feeling relieved that she will be put to sleep for the eye procedure, which makes it feel less scary and more doable to her.  Provided empathic listening, emotional support, and affirmation of strengths. Also provided [facility] transportation contact information so that she can arrange her ride for the cancer checkup.   [place], [place], [facility] Pager [phone] Voicemail [phone]

## 2022-04-01 ENCOUNTER — Encounter: Payer: Self-pay | Admitting: General Practice

## 2022-04-01 NOTE — Progress Notes (Signed)
[place] Spiritual Care Note  Returned Nayelie's voicemail to confirm that [phone] is the correct number to reach [place] with Transportation.   [place], [place], [facility] Pager [phone] Voicemail [phone]

## 2022-04-03 ENCOUNTER — Encounter: Payer: Self-pay | Admitting: Obstetrics & Gynecology

## 2022-04-03 ENCOUNTER — Inpatient Hospital Stay: Payer: Medicaid Other

## 2022-04-03 ENCOUNTER — Inpatient Hospital Stay: Payer: Medicaid Other | Attending: Radiation Oncology | Admitting: Obstetrics & Gynecology

## 2022-04-03 ENCOUNTER — Other Ambulatory Visit (HOSPITAL_COMMUNITY)
Admission: RE | Admit: 2022-04-03 | Discharge: 2022-04-03 | Disposition: A | Discharge status: Home / Self Care | Payer: Medicaid Other | Source: Ambulatory Visit | Attending: Obstetrics & Gynecology | Admitting: Obstetrics & Gynecology

## 2022-04-03 DIAGNOSIS — Z8544 Personal history of malignant neoplasm of other female genital organs: Secondary | ICD-10-CM

## 2022-04-03 DIAGNOSIS — C52 Malignant neoplasm of vagina: Secondary | ICD-10-CM | POA: Diagnosis present

## 2022-04-03 NOTE — Patient Instructions (Signed)
Return in 11/23.

## 2022-04-03 NOTE — Progress Notes (Signed)
Follow Up Note: Gyn-Onc  Jordan [name] 56 y.o. female  CC: Jordan [name] presents for a f/u visit   HPI: The oncology history was reviewed.  Interval History: Jordan [name] denies any vaginal bleeding, abdominal/pelvic pain, leg pain, cough or weight loss,   A Pap in 6/22 showed NILM-HR-HPV neg.  Jordan [name] was seen by Dr. Sondra Come last month.  Jordan [name] was found to have no clinical evidence of recurrence.  Jordan [name] reports periodic treatment for rUTI.  Review of Systems  Review of Systems  Constitutional:  Negative for malaise/fatigue and weight loss.  Respiratory:  Negative for shortness of breath and wheezing.   Cardiovascular:  Negative for chest pain and leg swelling.  Gastrointestinal:  Negative for abdominal pain, blood in stool, constipation, nausea and vomiting.  Genitourinary:  Negative for dysuria, frequency, hematuria and urgency.  Musculoskeletal:  Negative for joint pain and myalgias.  Neurological:  Negative for weakness.  Psychiatric/Behavioral:  Negative for depression. The patient does not have insomnia.    Current medications, allergy, social history, past surgical history, past medical history, family history were all reviewed.    Vitals:  Blood pressure 121/81, pulse 91, temperature 98.8 F (37.1 C), temperature source Tympanic, resp. rate 18, height '5\' 8"'$  (1.727 m), weight 210 lb (95.3 kg), SpO2 98 %.  Physical Exam:  Physical Exam Exam conducted with a chaperone present.  Constitutional:      General: Jordan [name] is not in acute distress. Cardiovascular:     Rate and Rhythm: Normal rate and regular rhythm.  Pulmonary:     Effort: Pulmonary effort is normal.     Breath sounds: Normal breath sounds. No wheezing or rhonchi.  Abdominal:     Palpations: Abdomen is soft.     Tenderness: There is no abdominal tenderness. There is no right CVA tenderness or left CVA tenderness.     Hernia: No hernia is present.  Genitourinary:    General: Normal vulva.     Urethra: No urethral lesion.     Vagina: No  lesions. No bleeding.  Foreshortened.  Pap collected Musculoskeletal:     Cervical back: Neck supple.     Right lower leg: No edema.     Left lower leg: No edema.  Lymphadenopathy:     Upper Body:     Right upper body: No supraclavicular adenopathy.     Left upper body: No supraclavicular adenopathy.     Lower Body: No right inguinal adenopathy. No left inguinal adenopathy.  Skin:    Findings: No rash.  Neurological:     Mental Status: Jordan [name] is oriented to person, place, and time.   Assessment/Plan:  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HCC) H/O Stage IA SCC of vagina Negative symptom review, normal exam.  No evidence of recurrence  >review the Pap test result >continue  q6 month surveillance; keep previously scheduled appointment w/Dr. Sondra Come. >continue pap testing of the vagina annually in June.   >return prn or in 11/23    Lahoma Crocker, MD

## 2022-04-03 NOTE — Assessment & Plan Note (Addendum)
H/O Stage IA SCC of vagina Negative symptom review, normal exam.  No evidence of recurrence  >review the Pap test result >continue  q6 month surveillance; keep previously scheduled appointment w/Dr. Sondra Come. >continue pap testing of the vagina annually in June.  >return prn or in 11/23

## 2022-04-10 ENCOUNTER — Telehealth: Payer: Self-pay

## 2022-04-10 NOTE — Telephone Encounter (Signed)
Per NP, add on HPV high risk. Spoke with Tonya in cytology at [facility],  she is going to add this on.

## 2022-04-12 LAB — CYTOLOGY - PAP
Adequacy: ABSENT
Comment: NEGATIVE
Diagnosis: UNDETERMINED — AB
High risk HPV: NEGATIVE

## 2022-04-17 ENCOUNTER — Other Ambulatory Visit: Payer: Self-pay

## 2022-04-17 ENCOUNTER — Ambulatory Visit (INDEPENDENT_AMBULATORY_CARE_PROVIDER_SITE_OTHER): Payer: Medicaid Other | Admitting: Obstetrics and Gynecology

## 2022-04-17 ENCOUNTER — Encounter: Payer: Self-pay | Admitting: Obstetrics and Gynecology

## 2022-04-17 ENCOUNTER — Encounter: Payer: Self-pay | Admitting: General Practice

## 2022-04-17 VITALS — BP 100/79 | HR 94 | Ht 68.0 in | Wt 215.0 lb

## 2022-04-17 DIAGNOSIS — F4321 Adjustment disorder with depressed mood: Secondary | ICD-10-CM | POA: Insufficient documentation

## 2022-04-17 DIAGNOSIS — N951 Menopausal and female climacteric states: Secondary | ICD-10-CM

## 2022-04-17 MED ORDER — ESTROGENS CONJUGATED 0.3 MG PO TABS
0.3000 mg | ORAL_TABLET | Freq: Every day | ORAL | 5 refills | Status: DC
Start: 2022-04-17 — End: 2022-07-18

## 2022-04-17 NOTE — Progress Notes (Signed)
[name] presents for f/u appt for her menopausal Sx. She did not start the Premarin due to concerns of risks and her boyfriend's illness Boyfriend has recently passed away She still has menopausal Sx  PE AF VSS Lungs clear Heart RRR Abd soft + BS  A/P Menopausal Sx        Grief  Discussed R/B of ERT with pt again. Pt still undecided but wants option to start if she desires. Rx renewed. Referral to [facility] for grief F/U in 3 months or PRN

## 2022-04-17 NOTE — Progress Notes (Signed)
[place] Spiritual Care Note  Received call from [place] to process today's gyn appointment, as well as some family concerns. She has received encouragement to return "home" to [place], where her daughter still lives, but feels clear that [place] is her home now. Denecia appears to be coping well with her boyfriend Jeff's death. A sense of ongoing connection brings comfort.  Curley plans to phone chaplain again as needed.   [place], [place], [facility] Pager [phone] Voicemail [phone]

## 2022-04-22 ENCOUNTER — Telehealth: Payer: Self-pay

## 2022-04-22 NOTE — Telephone Encounter (Signed)
Left another voicemail for pt to call me back regarding pap results.

## 2022-04-22 NOTE — Telephone Encounter (Signed)
LVM for pt to call back regarding pap smear results.

## 2022-04-24 NOTE — Telephone Encounter (Signed)
Unable to reach pt regarding recent pap smear results. LVM for pt to call back

## 2022-04-30 ENCOUNTER — Telehealth: Payer: Self-pay | Admitting: *Deleted

## 2022-04-30 NOTE — Telephone Encounter (Signed)
RETURNED PATIENT'S PHONE CALL, LVM FOR A RETURN CALL 

## 2022-05-03 ENCOUNTER — Telehealth: Payer: Self-pay | Admitting: Clinical

## 2022-05-03 NOTE — Telephone Encounter (Signed)
Attempt to call regarding referral; Left HIPPA-compliant message to call back Roselyn Reef from [employer] for [employer] at [facility] at  [phone] [facility] office) or front office at [phone].

## 2022-05-08 ENCOUNTER — Other Ambulatory Visit (HOSPITAL_BASED_OUTPATIENT_CLINIC_OR_DEPARTMENT_OTHER): Payer: Self-pay | Admitting: Nurse Practitioner

## 2022-05-08 DIAGNOSIS — Z1231 Encounter for screening mammogram for malignant neoplasm of breast: Secondary | ICD-10-CM

## 2022-05-23 ENCOUNTER — Telehealth: Payer: Self-pay | Admitting: *Deleted

## 2022-05-23 NOTE — Telephone Encounter (Signed)
Per patient request mailed appt calendar for December and March

## 2022-05-27 ENCOUNTER — Encounter: Payer: Self-pay | Admitting: General Practice

## 2022-05-27 NOTE — Progress Notes (Signed)
[place] Spiritual Care Note  Received several calls from [place] today. She reports that she is struggling with loneliness and grief since her longtime SO Merry Proud died. Shawnay states that she has no thoughts of self-harm, just sorrow and isolation. She is still working with her counselor regularly to process the life changes that come with this loss.  Provided empathic listening, normalization of feelings, and emotional support. Keeya calls regularly as needed.   [place], [place], [facility] Pager [phone] Voicemail [phone]

## 2022-06-26 ENCOUNTER — Telehealth: Payer: Self-pay | Admitting: Oncology

## 2022-06-26 NOTE — Telephone Encounter (Signed)
Jordan [name] called and requested to see Dr. Sondra Come for vaginal burning.  She said the burning went away for a while but is now back.  Advised her that Dr. Sondra Come is out of the office this week so scheduled her for follow up on 07/03/22.  Advised her to go to an urgent care/ER if the burning increases before she can see Dr. Sondra Come.  She verbalized understanding and agreement.

## 2022-06-28 ENCOUNTER — Encounter: Payer: Self-pay | Admitting: General Practice

## 2022-06-28 NOTE — Progress Notes (Signed)
[place] Spiritual Care Note  Received and returned voicemail from [place], encouraging return call when convenient.   [place], [place], [facility] Pager [phone] Voicemail [phone]

## 2022-07-02 NOTE — Progress Notes (Signed)
Radiation Oncology         (336) [phone] ________________________________  Name: Jordan [name] MRN: [id number]  Date: 07/03/2022  DOB: 02-10-1966  Follow-Up Visit Note  CC: [name], NP  [name], NP  No diagnosis found.  Diagnosis: FIGO Stage I (pT1a, pN0) Squamous Cell Carcinoma of the Vagina  Interval Since Last Radiation: 2 years, 9 months, and 11 days   Radiation Treatment Dates: 08/24/2019 through 09/22/2019 Site Technique Total Dose (Gy) Dose per Fx (Gy) Completed Fx Beam Energies  Pelvis: Pelvis_vagina HDR-brachy 30/30 6 5/5 Ir-192    Narrative:  The patient returns today for routine follow-up, she was last seen here for follow up on 03/13/22. Since her last visit, the patient followed up with gyn-onc, Dr. Delsa Sale, on 04/03/22. During which time, the patient denied any symptoms concerning for disease recurrence and was noted as NED one examination.     Otherwise, no significant interval history since the patient was last seen for follow-up.   ***                           Allergies:  is allergic to ciprofloxacin, citalopram, lamotrigine, sulfa antibiotics, and tramadol.  Meds: Current Outpatient Medications  Medication Sig Dispense Refill   estrogens, conjugated, (PREMARIN) 0.3 MG tablet Take 1 tablet (0.3 mg total) by mouth daily. Take daily for 21 days then do not take for 7 days. 30 tablet 5   Ferrous Sulfate (IRON) 325 (65 Fe) MG TABS Take 1 tablet by mouth every other day.     gabapentin (NEURONTIN) 800 MG tablet Take 800 mg by mouth 3 (three) times daily.     naproxen (NAPROSYN) 500 MG tablet Take 500 mg by mouth 2 (two) times daily with a meal.     oxybutynin (DITROPAN XL) 15 MG 24 hr tablet Take 15 mg by mouth 2 (two) times daily.     Oxycodone HCl 10 MG TABS Take 15 mg by mouth.     Vitamin D, Ergocalciferol, (DRISDOL) 1.25 MG (50000 UNIT) CAPS capsule Take 50,000 Units by mouth. Every 14 days     No current facility-administered medications for  this encounter.    Physical Findings: The patient is in no acute distress. Patient is alert and oriented.  vitals were not taken for this visit. .  No significant changes. Lungs are clear to auscultation bilaterally. Heart has regular rate and rhythm. No palpable cervical, supraclavicular, or axillary adenopathy. Abdomen soft, non-tender, normal bowel sounds.  On pelvic examination the external genitalia were unremarkable. A speculum exam was performed. There are no mucosal lesions noted in the vaginal vault. A Pap smear was obtained of the proximal vagina. On bimanual and rectovaginal examination there were no pelvic masses appreciated. ***    Lab Findings: Lab Results  Component Value Date   WBC 4.6 10/08/2021   HGB 14.4 10/08/2021   HCT 40.9 10/08/2021   MCV 96.0 10/08/2021   PLT 126 (L) 10/08/2021    Radiographic Findings: No results found.  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  ***  Plan:  ***   *** minutes of total time was spent for this patient encounter, including preparation, face-to-face counseling with the patient and coordination of care, physical exam, and documentation of the encounter. ____________________________________  Blair Promise, PhD, MD  This document serves as a record of services personally  performed by Gery Pray, MD. It was created on his behalf by Roney Mans, a trained medical scribe. The creation of this record is based on the scribe's personal observations and the provider's statements to them. This document has been checked and approved by the attending provider.

## 2022-07-03 ENCOUNTER — Ambulatory Visit
Admission: RE | Admit: 2022-07-03 | Discharge: 2022-07-03 | Disposition: A | Discharge status: Home / Self Care | Payer: Medicaid Other | Source: Ambulatory Visit | Attending: Radiation Oncology | Admitting: Radiation Oncology

## 2022-07-03 ENCOUNTER — Encounter: Payer: Self-pay | Admitting: Radiation Oncology

## 2022-07-03 ENCOUNTER — Other Ambulatory Visit: Payer: Self-pay

## 2022-07-03 ENCOUNTER — Inpatient Hospital Stay: Payer: Medicaid Other | Attending: Radiation Oncology

## 2022-07-03 VITALS — BP 107/71 | HR 110 | Temp 97.6°F | Resp 20 | Ht 68.0 in | Wt 212.4 lb

## 2022-07-03 DIAGNOSIS — Z923 Personal history of irradiation: Secondary | ICD-10-CM | POA: Diagnosis not present

## 2022-07-03 DIAGNOSIS — B372 Candidiasis of skin and nail: Secondary | ICD-10-CM | POA: Diagnosis not present

## 2022-07-03 DIAGNOSIS — Z79899 Other long term (current) drug therapy: Secondary | ICD-10-CM | POA: Insufficient documentation

## 2022-07-03 DIAGNOSIS — C52 Malignant neoplasm of vagina: Secondary | ICD-10-CM

## 2022-07-03 DIAGNOSIS — Z8544 Personal history of malignant neoplasm of other female genital organs: Secondary | ICD-10-CM | POA: Insufficient documentation

## 2022-07-03 LAB — URINALYSIS, COMPLETE (UACMP) WITH MICROSCOPIC
Bilirubin Urine: NEGATIVE
Glucose, UA: NEGATIVE mg/dL
Ketones, ur: 20 mg/dL — AB
Nitrite: POSITIVE — AB
Protein, ur: 30 mg/dL — AB
Specific Gravity, Urine: 1.017 (ref 1.005–1.030)
WBC, UA: >50 WBC/hpf — ABNORMAL HIGH (ref 0–5)
pH: 5 (ref 5.0–8.0)

## 2022-07-03 NOTE — Progress Notes (Signed)
Jordan [name] is here today for follow up post radiation to the pelvic.  They completed their radiation on: 09/22/19   Does the patient complain of any of the following:  Pain:Patient reports having severe pain to vagina. Patient states she has a strong vaginal odor.  Abdominal bloating: No  Diarrhea/Constipation: no Nausea/Vomiting: No Vaginal Discharge: No Blood in Urine or Stool: No Urinary Issues (dysuria/incomplete emptying/ incontinence/ increased frequency/urgency): Dysuria, urgency and frequency.  Does patient report using vaginal dilator 2-3 times a week and/or sexually active 2-3 weeks: No Post radiation skin changes: No   Additional comments if applicable:   BP 729/02 (BP Location: Left Arm, Patient Position: Sitting, Cuff Size: Normal)   Pulse (!) 110   Temp 97.6 F (36.4 C)   Resp 20   Ht '5\' 8"'$  (1.727 m)   Wt 212 lb 6.4 oz (96.3 kg)   LMP  (LMP Unknown)   SpO2 98%   BMI 32.30 kg/m

## 2022-07-05 ENCOUNTER — Telehealth: Payer: Self-pay | Admitting: Oncology

## 2022-07-05 LAB — URINE CULTURE: Culture: >=100000 — AB

## 2022-07-05 NOTE — Telephone Encounter (Signed)
Jordan [name] called and asked if her urine culture results are back yet.  Advised her the preliminary results are in but we are waiting for the sensitivity results to know which antibiotic to use.  Advised we will call her on Monday with treatment recommendations.  She verbalized understanding and agreement.

## 2022-07-08 ENCOUNTER — Other Ambulatory Visit: Payer: Self-pay | Admitting: Radiation Oncology

## 2022-07-08 MED ORDER — NITROFURANTOIN MONOHYD MACRO 100 MG PO CAPS: 100.0000 mg | ORAL_CAPSULE | Freq: Two times a day (BID) | ORAL | 0 refills | Status: DC

## 2022-07-08 NOTE — Telephone Encounter (Signed)
Called Jordan [name] and advised her of the urine culture results.  Discussed that nitrofurantoin is sensitive to it and asked if she has ever taken it before.  She said that she has and didn't have any issues with it.  She would like it sent to [facility] [address].

## 2022-07-11 ENCOUNTER — Encounter: Payer: Self-pay | Admitting: General Practice

## 2022-07-11 NOTE — Progress Notes (Signed)
[place] Spiritual Care Note  Returned call from [place], providing empathic listening, emotional support, and affirmation of strengths as she processed her how she is grieving the death of her boyfriend Merry Proud.  She plans to call again as needed.   [place], [place], [facility] Pager [phone] Voicemail [phone]

## 2022-07-16 ENCOUNTER — Other Ambulatory Visit: Payer: Self-pay | Admitting: Radiation Oncology

## 2022-07-16 ENCOUNTER — Telehealth: Payer: Self-pay

## 2022-07-16 MED ORDER — METRONIDAZOLE 0.75 % VA GEL: 1.0000 | Freq: Every day | VAGINAL | 0 refills | Status: DC

## 2022-07-16 MED ORDER — PHENAZOPYRIDINE HCL 200 MG PO TABS: 200.0000 mg | ORAL_TABLET | Freq: Three times a day (TID) | ORAL | 0 refills | Status: DC | PRN

## 2022-07-16 NOTE — Telephone Encounter (Signed)
Call placed to make aware that Dr. Sondra Come sent in new prescriptions for urinary issues. Patient voiced understanding.

## 2022-07-16 NOTE — Telephone Encounter (Signed)
Received a call from patient stating the she continues to have dysuria, strong odor and pain to vagina. Patient states she completed Macrobid for UTI as directed with no urinary  relief. Please advise.

## 2022-07-18 ENCOUNTER — Encounter: Payer: Self-pay | Admitting: Obstetrics and Gynecology

## 2022-07-18 ENCOUNTER — Ambulatory Visit: Payer: Self-pay | Admitting: Clinical

## 2022-07-18 ENCOUNTER — Ambulatory Visit: Payer: Medicaid Other | Admitting: Obstetrics and Gynecology

## 2022-07-18 ENCOUNTER — Ambulatory Visit (INDEPENDENT_AMBULATORY_CARE_PROVIDER_SITE_OTHER): Payer: Medicaid Other | Admitting: Obstetrics and Gynecology

## 2022-07-18 VITALS — BP 128/73 | HR 102 | Wt 218.5 lb

## 2022-07-18 DIAGNOSIS — F4321 Adjustment disorder with depressed mood: Secondary | ICD-10-CM | POA: Diagnosis not present

## 2022-07-18 DIAGNOSIS — N951 Menopausal and female climacteric states: Secondary | ICD-10-CM | POA: Diagnosis not present

## 2022-07-18 NOTE — Patient Instructions (Signed)

## 2022-07-18 NOTE — BH Specialist Note (Addendum)
[name] Initial In-Person Visit  I reviewed patient visit with the [facility] Intern, and I concur with the treatment plan, as documented in the [facility] Intern note.   No charge for this visit due to [facility] Intern seeing patient.  Vesta Mixer, MSW, [place] for [place] at [facility]   MRN: [id number] Name: Jordan [name]  Number of [place] Clinician visits: 1- Initial Visit  Session Start time: 8469    Session End time: 6295  Total time in minutes: 65  Types of Service: Individual psychotherapy   Interpretor:No. Interpretor Name and Language: N/A     Warm Hand Off Completed.             Subjective: Jordan [name] is a 56 y.o. female . Patient was referred by Dr. Legrand Como L. Ervin for Grief. Patient reports the following symptoms/concerns: concerns regarding the death of her long-time partner, grief, lack of support, food insecurity  Duration of problem: pt has been experiencing symptoms of grief for a few weeks now since the death of her partner   Objective: Mood: Angry and Hopeless and Affect: Appropriate and Tearful Risk of harm to self or others: No plan to harm self or others     Patient and/or Family's Strengths/Protective Factors: Concrete supports in place (healthy food, safe environments, etc.) and Sense of purpose   Goals Addressed: Patient will: Reduce symptoms of: depression, stress, and grief Increase knowledge and/or ability of: coping skills and stress reduction  Demonstrate ability to: Increase healthy adjustment to current life circumstances, Increase adequate support systems for patient/family, and continue healthy grieving over loss   Progress towards Goals: Ongoing   Interventions: Interventions utilized: Supportive Counseling and Supportive Reflection      Patient and/or Family Response:  Pt requested to be rescheduled for a follow-up and  seemed open to counseling around grief and bereavement.    Patient Centered Plan: Patient is on the following Treatment Plan(s):  -Continue prioritizing healthy self-care (regular meals, adequate rest; allowing practical help from supportive friends and family)      Assessment: Patient currently experiencing grief from the loss of her partner which is creating symptoms of depression   Patient may benefit from building up supports to help during bereavement    Plan Follow up with behavioral health clinician in two weeks  Behavioral recommendations:              - continue taking medication for symptoms of bipolar             - continue mindfulness exercises (journal, deep breathing etc) Referral(s): [place] (In Clinic)             -[employer] [place]

## 2022-07-18 NOTE — Progress Notes (Signed)
Jordan [name] here for follow up of her menopausal Sx. See prior office notes She decided not to start HRT  from last office visit She reports able to manage her menopausal Sx thus far Still working with the loss of her boyfriend  PE AF VSS Lungs clear Heart RRR Abd soft + BS  A/P Menopausal Sx          Grief  Will continue to manage Sx, conservatively at present. To see Roselyn Reef today F/U in 6 months

## 2022-07-18 NOTE — Progress Notes (Addendum)
error 

## 2022-07-22 ENCOUNTER — Telehealth: Payer: Self-pay | Admitting: Oncology

## 2022-07-22 DIAGNOSIS — R300 Dysuria: Secondary | ICD-10-CM

## 2022-07-22 NOTE — Telephone Encounter (Signed)
Jordan [name] called and has continued to have burning with urination and a strong odor.  She is taking the metrolgel and pyridium without relief.  She also reports having a rash between her legs. She would like to make an appointment with Dr. Sondra Come.

## 2022-07-22 NOTE — Telephone Encounter (Signed)
Called [place] back and scheduled appointments for lab at 1:00 and follow up with Dr. Sondra Come at 1:30 tomorrow. She verbalized understanding of appointment times.

## 2022-07-22 NOTE — Progress Notes (Signed)
Radiation Oncology         [phone] ________________________________  Name: Jordan [name] MRN: [id number]  Date: 07/23/2022  DOB: August 07, 1966  Follow-Up Visit Note  CC: [name], NP  [name], NP    ICD-10-CM   1. Vaginal cancer ([place])  C52     2. 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 During which time, the patient also presented with complaints of vaginal burning and she was given macrobid for a UTI. (Pelvic exam during her last visit was unremarkable).   On 07/16/22, the patient called radiation oncology with complaints of ongoing dysuria, strong odor and pain to vagina. She also stated that she completed Macrobid without relief.  She also completed using her MetroGel vaginal gel.  In the interval, the patient met with her OB/GYN, Dr. Rip Harbour, on 07/18/22 for follow-up of her menopausal symptoms. During which time, the patient reported being able to manage her menopausal symptoms so far. Per review of encounter notes, it seems as though her GU symptoms of concern were not addressed during this visit.    On evaluation today the patient continues to complain of discomfort in the vaginal area.  She has also noticed a rash along the right groin area.  She denies any hematuria or vaginal bleeding.  She denies any rectal bleeding.                         Allergies:  is allergic to ciprofloxacin, citalopram, lamotrigine, sulfa antibiotics, and tramadol.  Meds: Current Outpatient Medications   Medication Sig Dispense Refill   Ferrous Sulfate (IRON) 325 (65 Fe) MG TABS Take 1 tablet by mouth every other day.     fluconazole (DIFLUCAN) 100 MG tablet Take 1 tablet (100 mg total) by mouth daily. 7 tablet 0   gabapentin (NEURONTIN) 800 MG tablet Take 800 mg by mouth 3 (three) times daily.     metroNIDAZOLE (METROGEL VAGINAL) 0.75 % vaginal gel Place 1 Applicatorful vaginally at bedtime. For 7 days 70 g 0   naproxen (NAPROSYN) 500 MG tablet Take 500 mg by mouth 2 (two) times daily with a meal.     oxybutynin (DITROPAN XL) 15 MG 24 hr tablet Take 15 mg by mouth 2 (two) times daily.     Oxycodone HCl 10 MG TABS Take 15 mg by mouth.     phenazopyridine (PYRIDIUM) 200 MG tablet Take 1 tablet (200 mg total) by mouth 3 (three) times daily as needed for pain. 25 tablet 0   Vitamin D, Ergocalciferol, (DRISDOL) 1.25 MG (50000 UNIT) CAPS capsule Take 50,000 Units by mouth. Every 14 days     nitrofurantoin, macrocrystal-monohydrate, (MACROBID) 100 MG capsule Take 1 capsule (100 mg total) by mouth 2 (two) times daily. (Patient not taking: Reported on 07/23/2022) 14 capsule 0   No current facility-administered medications for this encounter.    Physical Findings: The patient is in no acute distress. Patient is alert and oriented.  height is _0  (1.727 m) and weight is 217 lb 6.4 oz (98.6 kg). Her temperature is 97.8 F (36.6 C). Her blood pressure is 110/68 and her pulse is 106 (abnormal). Her respiration is 20 and oxygen saturation is 96%. .  No significant changes. Lungs are clear to auscultation bilaterally. Heart has regular rate and rhythm. No palpable cervical, supraclavicular, or axillary adenopathy. Abdomen soft, non-tender, normal bowel sounds.  On pelvic examination the external genitalia were unremarkable. A speculum exam was performed. There are no mucosal lesions noted in the vaginal vault.  On bimanual  examination there were no pelvic masses appreciated.  White discharge in the vaginal  area consistent with a yeast infection.  She reports not using the MetroGel in several days.  She has significant erythema in the right inguinal and skin folds of the right lower abdominal region consistent with a yeast infection.    Lab Findings: Lab Results  Component Value Date   WBC 4.6 10/08/2021   HGB 14.4 10/08/2021   HCT 40.9 10/08/2021   MCV 96.0 10/08/2021   PLT 126 (L) 10/08/2021    Radiographic Findings: No results found.  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  The patient has developed a vaginal as well as in yeast infection.  She was given antifungal powder to place along the involved areas of her skin.  She will also be placed on Diflucan 100 mg for 7-day course.  Plan: She is to keep her scheduled follow-up appointment.  She did have a cath UA performed today and if she is found to have a bladder infection she will placed on appropriate antibiotics for this issue.   24 minutes of total time was spent for this patient encounter, including preparation, face-to-face counseling with the patient and coordination of care, physical exam, and documentation of the encounter. ____________________________________  Blair Promise, PhD, MD  This document serves as a record of services personally performed by Gery Pray, MD. It was created on his behalf by Roney Mans, a trained medical scribe. The creation of this record is based on the scribe's personal observations and the provider's statements to them. This document has been checked and approved by the attending provider.

## 2022-07-23 ENCOUNTER — Other Ambulatory Visit: Payer: Self-pay

## 2022-07-23 ENCOUNTER — Inpatient Hospital Stay: Payer: Medicaid Other | Attending: Radiation Oncology

## 2022-07-23 ENCOUNTER — Ambulatory Visit
Admission: RE | Admit: 2022-07-23 | Discharge: 2022-07-23 | Disposition: A | Discharge status: Home / Self Care | Payer: Medicaid Other | Source: Ambulatory Visit | Attending: Radiation Oncology | Admitting: Radiation Oncology

## 2022-07-23 ENCOUNTER — Inpatient Hospital Stay: Payer: Medicaid Other

## 2022-07-23 VITALS — BP 110/68 | HR 106 | Temp 97.8°F | Resp 20 | Ht 68.0 in | Wt 217.4 lb

## 2022-07-23 DIAGNOSIS — Z8544 Personal history of malignant neoplasm of other female genital organs: Secondary | ICD-10-CM | POA: Insufficient documentation

## 2022-07-23 DIAGNOSIS — Z79899 Other long term (current) drug therapy: Secondary | ICD-10-CM | POA: Insufficient documentation

## 2022-07-23 DIAGNOSIS — R300 Dysuria: Secondary | ICD-10-CM

## 2022-07-23 DIAGNOSIS — C52 Malignant neoplasm of vagina: Secondary | ICD-10-CM

## 2022-07-23 DIAGNOSIS — Z923 Personal history of irradiation: Secondary | ICD-10-CM | POA: Insufficient documentation

## 2022-07-23 LAB — URINALYSIS, COMPLETE (UACMP) WITH MICROSCOPIC
Bilirubin Urine: NEGATIVE
Glucose, UA: NEGATIVE mg/dL
Hgb urine dipstick: NEGATIVE
Ketones, ur: NEGATIVE mg/dL
Nitrite: POSITIVE — AB
Protein, ur: 30 mg/dL — AB
Specific Gravity, Urine: 1.018 (ref 1.005–1.030)
WBC, UA: >50 WBC/hpf — ABNORMAL HIGH (ref 0–5)
pH: 5 (ref 5.0–8.0)

## 2022-07-23 MED ORDER — FLUCONAZOLE 100 MG PO TABS: 100.0000 mg | ORAL_TABLET | Freq: Every day | ORAL | 0 refills | Status: DC

## 2022-07-23 NOTE — Progress Notes (Signed)
Jordan [name] is here today for follow up post radiation to the pelvic.   Does the patient complain of any of the following:  Pain:rating pain in her vaginal area at an 8/10 Abdominal bloating: no Diarrhea/Constipation: no Nausea/Vomiting: no Vaginal Discharge: no Blood in Urine or Stool: no Urinary Issues (dysuria/incomplete emptying/ incontinence/ increased frequency/urgency): dysuria and burning with radiation Does patient report using vaginal dilator 2-3 times a week and/or sexually active 2-3 weeks: no Post radiation skin changes: no   Additional comments if applicable: Reports having a rash on her right groin area.  She said it has a strong odor to it.  BP 110/68 (BP Location: Left Arm, Patient Position: Sitting, Cuff Size: Large)   Pulse (!) 106   Temp 97.8 F (36.6 C)   Resp 20   Ht '5\' 8"'$  (1.727 m)   Wt 217 lb 6.4 oz (98.6 kg)   LMP  (LMP Unknown)   SpO2 96%   BMI 33.06 kg/m

## 2022-07-25 ENCOUNTER — Other Ambulatory Visit: Payer: Self-pay | Admitting: Radiation Oncology

## 2022-07-25 LAB — URINE CULTURE: Culture: >=100000 — AB

## 2022-07-25 MED ORDER — NITROFURANTOIN MONOHYD MACRO 100 MG PO CAPS: 100.0000 mg | ORAL_CAPSULE | Freq: Two times a day (BID) | ORAL | 0 refills | Status: DC

## 2022-07-26 ENCOUNTER — Telehealth: Payer: Self-pay | Admitting: Oncology

## 2022-07-26 ENCOUNTER — Telehealth: Payer: Self-pay

## 2022-07-26 NOTE — Telephone Encounter (Signed)
Call placed to patient to make aware that antibiotic has been sent to pharmacy. Message left. Awaiting call back.

## 2022-07-26 NOTE — Telephone Encounter (Signed)
Jordan [name] called back and was advised of her urine culture results and that Dr. Sondra Come has sent in an antibiotic to the [facility].  She verbalized understanding and agreement.

## 2022-08-01 ENCOUNTER — Ambulatory Visit: Payer: Self-pay

## 2022-08-06 ENCOUNTER — Ambulatory Visit: Payer: Medicaid Other | Admitting: Audiology

## 2022-08-07 ENCOUNTER — Ambulatory Visit: Payer: Self-pay

## 2022-08-12 ENCOUNTER — Ambulatory Visit: Payer: Medicaid Other | Admitting: Audiology

## 2022-08-14 ENCOUNTER — Ambulatory Visit: Payer: Medicaid Other | Admitting: Clinical

## 2022-08-14 DIAGNOSIS — F4321 Adjustment disorder with depressed mood: Secondary | ICD-10-CM

## 2022-08-15 NOTE — BH Specialist Note (Signed)
Integrated Behavioral Health Follow Up In-Person Visit  MRN: [id number] Name: Jordan [name] I reviewed patient visit with the [facility] Intern, and I concur with the treatment plan, as documented in the [facility] Intern note.   No charge for this visit due to [facility] Intern seeing patient.  Vesta Mixer, MSW, [place] for [facility] at [facility]  Number of [place] Clinician visits: 2- Second Visit  Session Start time: [phone]   Session End time: 0425  Total time in minutes: 75   Types of Service: Individual psychotherapy  Interpretor:No.   Subjective: Jordan [name] is a 56 y.o. female  Patient was referred by Dr.Michael Ervin for Grief. Patient reports the following symptoms/concerns: Concerns with the recent loss of her long-term partner Duration of problem: ongoing;   Objective: Mood: Negative and Depressed and Affect: Appropriate Risk of harm to self or others: No plan to harm self or others  Life Context: Family and Social: Pt doesn't have any family in the area, all are staying in [place] School/Work: Pt currently does not work and is on a fixed monthly income  Life Changes: Pt's long-term partner recently passed away  Patient and/or Family's Strengths/Protective Factors: Sense of purpose  Goals Addressed: Patient will:  Reduce symptoms of: anxiety and depression   Increase knowledge and/or ability of: coping skills and stress reduction   Demonstrate ability to: Increase healthy adjustment to current life circumstances, Increase adequate support systems for patient/family, and Continue healthy grieving over loss   Progress towards Goals: Ongoing  Interventions: Interventions utilized:  Solution-Focused Strategies and Supportive Counseling Standardized Assessments completed: Not Needed  Patient and/or Family Response:  Pt agrees with treatment plan  Plan: Follow up with  behavioral health clinician on : November 8th, 2:00 PM Behavioral recommendations: -Continue prioritizing healthy self-care (regular meals, adequate rest; allowing practical help from supportive friends and family) Referral(s): [place] (In Clinic)   [place]

## 2022-08-17 ENCOUNTER — Encounter (HOSPITAL_COMMUNITY): Payer: Self-pay

## 2022-08-17 ENCOUNTER — Emergency Department (HOSPITAL_COMMUNITY): Payer: Medicaid Other

## 2022-08-17 ENCOUNTER — Other Ambulatory Visit: Payer: Self-pay

## 2022-08-17 ENCOUNTER — Inpatient Hospital Stay (HOSPITAL_COMMUNITY)
Admission: EM | Admit: 2022-08-17 | Discharge: 2022-08-22 | DRG: 872 | Disposition: A | Discharge status: Home Health | Payer: Medicaid Other | Attending: Internal Medicine | Admitting: Internal Medicine

## 2022-08-17 DIAGNOSIS — N390 Urinary tract infection, site not specified: Secondary | ICD-10-CM

## 2022-08-17 DIAGNOSIS — Z8544 Personal history of malignant neoplasm of other female genital organs: Secondary | ICD-10-CM

## 2022-08-17 DIAGNOSIS — Z20822 Contact with and (suspected) exposure to covid-19: Secondary | ICD-10-CM | POA: Diagnosis present

## 2022-08-17 DIAGNOSIS — Z91148 Patient's other noncompliance with medication regimen for other reason: Secondary | ICD-10-CM

## 2022-08-17 DIAGNOSIS — E538 Deficiency of other specified B group vitamins: Secondary | ICD-10-CM | POA: Diagnosis present

## 2022-08-17 DIAGNOSIS — R6520 Severe sepsis without septic shock: Secondary | ICD-10-CM | POA: Diagnosis present

## 2022-08-17 DIAGNOSIS — D638 Anemia in other chronic diseases classified elsewhere: Secondary | ICD-10-CM | POA: Diagnosis present

## 2022-08-17 DIAGNOSIS — B9620 Unspecified Escherichia coli [E. coli] as the cause of diseases classified elsewhere: Secondary | ICD-10-CM | POA: Diagnosis present

## 2022-08-17 DIAGNOSIS — Z923 Personal history of irradiation: Secondary | ICD-10-CM | POA: Diagnosis not present

## 2022-08-17 DIAGNOSIS — N952 Postmenopausal atrophic vaginitis: Secondary | ICD-10-CM

## 2022-08-17 DIAGNOSIS — F259 Schizoaffective disorder, unspecified: Secondary | ICD-10-CM | POA: Diagnosis present

## 2022-08-17 DIAGNOSIS — B3789 Other sites of candidiasis: Secondary | ICD-10-CM | POA: Diagnosis present

## 2022-08-17 DIAGNOSIS — D72819 Decreased white blood cell count, unspecified: Secondary | ICD-10-CM

## 2022-08-17 DIAGNOSIS — D531 Other megaloblastic anemias, not elsewhere classified: Secondary | ICD-10-CM | POA: Diagnosis present

## 2022-08-17 DIAGNOSIS — Z811 Family history of alcohol abuse and dependence: Secondary | ICD-10-CM | POA: Diagnosis not present

## 2022-08-17 DIAGNOSIS — D61818 Other pancytopenia: Secondary | ICD-10-CM

## 2022-08-17 DIAGNOSIS — F319 Bipolar disorder, unspecified: Secondary | ICD-10-CM | POA: Diagnosis present

## 2022-08-17 DIAGNOSIS — Z809 Family history of malignant neoplasm, unspecified: Secondary | ICD-10-CM

## 2022-08-17 DIAGNOSIS — Z90710 Acquired absence of both cervix and uterus: Secondary | ICD-10-CM | POA: Diagnosis not present

## 2022-08-17 DIAGNOSIS — C52 Malignant neoplasm of vagina: Secondary | ICD-10-CM | POA: Diagnosis present

## 2022-08-17 DIAGNOSIS — Z79899 Other long term (current) drug therapy: Secondary | ICD-10-CM | POA: Diagnosis not present

## 2022-08-17 DIAGNOSIS — A419 Sepsis, unspecified organism: Secondary | ICD-10-CM | POA: Diagnosis present

## 2022-08-17 DIAGNOSIS — Z859 Personal history of malignant neoplasm, unspecified: Secondary | ICD-10-CM | POA: Diagnosis present

## 2022-08-17 LAB — CBC WITH DIFFERENTIAL/PLATELET
Abs Immature Granulocytes: 0 10*3/uL (ref 0.00–0.07)
Basophils Absolute: 0 10*3/uL (ref 0.0–0.1)
Basophils Relative: 0 %
Eosinophils Absolute: 0 10*3/uL (ref 0.0–0.5)
Eosinophils Relative: 0 %
HCT: 21.6 % — ABNORMAL LOW (ref 36.0–46.0)
Hemoglobin: 7.6 g/dL — ABNORMAL LOW (ref 12.0–15.0)
Immature Granulocytes: 0 %
Lymphocytes Relative: 16 %
Lymphs Abs: 0.2 10*3/uL — ABNORMAL LOW (ref 0.7–4.0)
MCH: 42.9 pg — ABNORMAL HIGH (ref 26.0–34.0)
MCHC: 35.2 g/dL (ref 30.0–36.0)
MCV: 122 fL — ABNORMAL HIGH (ref 80.0–100.0)
Monocytes Absolute: 0 10*3/uL — ABNORMAL LOW (ref 0.1–1.0)
Monocytes Relative: 3 %
Neutro Abs: 1.1 10*3/uL — ABNORMAL LOW (ref 1.7–7.7)
Neutrophils Relative %: 81 %
Platelets: 71 10*3/uL — ABNORMAL LOW (ref 150–400)
RBC: 1.77 MIL/uL — ABNORMAL LOW (ref 3.87–5.11)
RDW: 15.8 % — ABNORMAL HIGH (ref 11.5–15.5)
WBC: 1.3 10*3/uL — CL (ref 4.0–10.5)
nRBC: 0 % (ref 0.0–0.2)

## 2022-08-17 LAB — URINALYSIS, ROUTINE W REFLEX MICROSCOPIC
Bilirubin Urine: NEGATIVE
Glucose, UA: NEGATIVE mg/dL
Hgb urine dipstick: NEGATIVE
Ketones, ur: 5 mg/dL — AB
Nitrite: NEGATIVE
Protein, ur: 30 mg/dL — AB
Specific Gravity, Urine: 1.016 (ref 1.005–1.030)
pH: 6 (ref 5.0–8.0)

## 2022-08-17 LAB — BASIC METABOLIC PANEL
Anion gap: 6 (ref 5–15)
BUN: 11 mg/dL (ref 6–20)
CO2: 24 mmol/L (ref 22–32)
Calcium: 8.2 mg/dL — ABNORMAL LOW (ref 8.9–10.3)
Chloride: 109 mmol/L (ref 98–111)
Creatinine, Ser: 0.66 mg/dL (ref 0.44–1.00)
GFR, Estimated: >60 mL/min (ref >60)
Glucose, Bld: 134 mg/dL — ABNORMAL HIGH (ref 70–99)
Potassium: 3.5 mmol/L (ref 3.5–5.1)
Sodium: 139 mmol/L (ref 135–145)

## 2022-08-17 LAB — LACTIC ACID, PLASMA
Lactic Acid, Venous: 2.4 mmol/L (ref 0.5–1.9)
Lactic Acid, Venous: 1 mmol/L (ref 0.5–1.9)

## 2022-08-17 LAB — RESP PANEL BY RT-PCR (FLU A&B, COVID) ARPGX2
Influenza A by PCR: NEGATIVE
Influenza B by PCR: NEGATIVE
SARS Coronavirus 2 by RT PCR: NEGATIVE

## 2022-08-17 LAB — VITAMIN B12: Vitamin B-12: <50 pg/mL — ABNORMAL LOW (ref 180–914)

## 2022-08-17 MED ORDER — IOHEXOL 300 MG/ML  SOLN
100.0000 mL | Freq: Once | INTRAMUSCULAR | Status: AC | PRN
  Administered 2022-08-17: 100 mL via INTRAVENOUS

## 2022-08-17 MED ORDER — LACTATED RINGERS IV SOLN: INTRAVENOUS | Status: AC

## 2022-08-17 MED ORDER — OXYCODONE HCL 5 MG PO TABS
5.0000 mg | ORAL_TABLET | Freq: Four times a day (QID) | ORAL | Status: DC | PRN
  Administered 2022-08-18 – 2022-08-20 (×4): 5 mg via ORAL
  Filled 2022-08-17 (×4): qty 1

## 2022-08-17 MED ORDER — POLYETHYLENE GLYCOL 3350 17 G PO PACK: 17.0000 g | PACK | Freq: Every day | ORAL | Status: DC | PRN

## 2022-08-17 MED ORDER — ACETAMINOPHEN 325 MG PO TABS
650.0000 mg | ORAL_TABLET | Freq: Four times a day (QID) | ORAL | Status: DC | PRN
  Administered 2022-08-18 – 2022-08-22 (×5): 650 mg via ORAL
  Filled 2022-08-17 (×5): qty 2

## 2022-08-17 MED ORDER — SODIUM CHLORIDE 0.9 % IV SOLN
1.0000 g | INTRAVENOUS | Status: AC
  Administered 2022-08-17: 1 g via INTRAVENOUS
  Filled 2022-08-17: qty 10

## 2022-08-17 MED ORDER — ENOXAPARIN SODIUM 40 MG/0.4ML IJ SOSY
40.0000 mg | PREFILLED_SYRINGE | INTRAMUSCULAR | Status: DC
  Administered 2022-08-17 – 2022-08-21 (×5): 40 mg via SUBCUTANEOUS
  Filled 2022-08-17 (×5): qty 0.4

## 2022-08-17 MED ORDER — MELATONIN 5 MG PO TABS
5.0000 mg | ORAL_TABLET | Freq: Every evening | ORAL | Status: DC | PRN
  Administered 2022-08-21: 5 mg via ORAL
  Filled 2022-08-17: qty 1

## 2022-08-17 MED ORDER — SODIUM CHLORIDE 0.9 % IV SOLN: 1.0000 g | Freq: Once | INTRAVENOUS | Status: DC

## 2022-08-17 MED ORDER — SODIUM CHLORIDE 0.9 % IV SOLN
2.0000 g | INTRAVENOUS | Status: DC
  Administered 2022-08-18 – 2022-08-22 (×5): 2 g via INTRAVENOUS
  Filled 2022-08-17 (×5): qty 20

## 2022-08-17 MED ORDER — ONDANSETRON HCL 4 MG/2ML IJ SOLN
4.0000 mg | Freq: Once | INTRAMUSCULAR | Status: AC
  Administered 2022-08-17: 4 mg via INTRAVENOUS
  Filled 2022-08-17: qty 2

## 2022-08-17 MED ORDER — ENOXAPARIN SODIUM 40 MG/0.4ML IJ SOSY: 40.0000 mg | PREFILLED_SYRINGE | INTRAMUSCULAR | Status: DC

## 2022-08-17 MED ORDER — PROCHLORPERAZINE EDISYLATE 10 MG/2ML IJ SOLN
5.0000 mg | Freq: Four times a day (QID) | INTRAMUSCULAR | Status: DC | PRN
  Administered 2022-08-18: 5 mg via INTRAVENOUS
  Filled 2022-08-17: qty 2

## 2022-08-17 MED ORDER — SODIUM CHLORIDE 0.9 % IV BOLUS
1000.0000 mL | Freq: Once | INTRAVENOUS | Status: AC
  Administered 2022-08-17: 1000 mL via INTRAVENOUS

## 2022-08-17 MED ORDER — SODIUM CHLORIDE 0.9 % IV SOLN
1.0000 g | Freq: Once | INTRAVENOUS | Status: AC
  Administered 2022-08-17: 1 g via INTRAVENOUS
  Filled 2022-08-17: qty 10

## 2022-08-17 MED ORDER — GABAPENTIN 400 MG PO CAPS
400.0000 mg | ORAL_CAPSULE | Freq: Three times a day (TID) | ORAL | Status: DC
  Administered 2022-08-17 – 2022-08-18 (×4): 400 mg via ORAL
  Filled 2022-08-17 (×5): qty 1

## 2022-08-17 NOTE — Sepsis Progress Note (Signed)
[place] following for Sepsis Protocol 

## 2022-08-17 NOTE — ED Provider Notes (Signed)
[facility] DEPT Provider Note   CSN: [id number] Arrival date & time: 08/17/22  1545     History  Chief Complaint  Patient presents with   Dysuria    Jordan [name] is a 56 y.o. female.   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  She states that she has been having suprapubic discomfort.  Additionally, she has felt somewhat lightheaded and unsteady on her feet over the past few days as well.  She denies any fevers or chills. She denies any dysarthria, dysphagia, facial droop, numbness or weakness. She arrives GCS 15, ABC intact.  Home Medications Prior to Admission medications   Medication Sig Start Date End Date Taking? Authorizing Provider  gabapentin (NEURONTIN) 800 MG tablet Take 800 mg by mouth 3 (three) times daily. 01/11/22  Yes [provider]  Oxycodone HCl 10 MG TABS Take 10 mg by mouth daily as needed. 01/27/20  Yes [provider]  fluconazole (DIFLUCAN) 100 MG tablet Take 1 tablet (100 mg total) by mouth daily. Patient not taking: Reported on 08/17/2022 07/23/22   Gery Pray, MD  metroNIDAZOLE (METROGEL VAGINAL) 0.75 % vaginal gel Place 1 Applicatorful vaginally at bedtime. For 7 days Patient not taking: Reported on 08/17/2022 07/16/22   Gery Pray, MD  nitrofurantoin, macrocrystal-monohydrate, (MACROBID) 100 MG capsule Take 1 capsule (100 mg total) by mouth 2 (two) times daily. Patient not taking: Reported on 08/17/2022 07/25/22   Gery Pray, MD  oxybutynin (DITROPAN XL) 15 MG 24 hr tablet Take 15 mg by mouth 2 (two) times daily. Patient not taking: Reported on 08/17/2022 01/25/21   [provider]  phenazopyridine (PYRIDIUM) 200 MG tablet Take 1 tablet (200 mg  total) by mouth 3 (three) times daily as needed for pain. Patient not taking: Reported on 08/17/2022 07/16/22   Gery Pray, MD      Allergies    Ciprofloxacin, Citalopram, Lamotrigine, Sulfa antibiotics, and Tramadol    Review of Systems   Review of Systems  Genitourinary:  Positive for dysuria.  All other systems reviewed and are negative.   Physical Exam Updated Vital Signs BP 111/64   Pulse (!) 106   Temp 100.3 F (37.9 C) (Oral)   Resp 14   Ht _0  (1.727 m)   Wt 104.3 kg   LMP  (LMP Unknown)   SpO2 99%   BMI 34.97 kg/m  Physical Exam Vitals and nursing note reviewed.  Constitutional:      General: She is not in acute distress.    Appearance: She is well-developed.  HENT:     Head: Normocephalic and atraumatic.  Eyes:     Conjunctiva/sclera: Conjunctivae normal.  Cardiovascular:     Rate and Rhythm: Regular rhythm. Tachycardia present.  Pulmonary:     Effort: Pulmonary effort is normal. No respiratory distress.     Breath sounds: Normal breath sounds.  Abdominal:     Palpations: Abdomen is soft.     Tenderness: There is abdominal tenderness in the suprapubic area. There is no guarding or rebound.  Musculoskeletal:        General: No swelling.     Cervical back: Neck supple.  Skin:    General: Skin is warm and dry.     Capillary Refill: Capillary refill takes less than 2 seconds.  Neurological:     Mental Status: She is alert.     GCS: GCS eye subscore is 4. GCS verbal subscore is 5. GCS motor subscore is 6.     Cranial Nerves: Cranial nerves 2-12 are intact.     Sensory: Sensation is intact.     Motor: Motor function is intact.     Coordination: Coordination is intact.  Psychiatric:        Mood and Affect: Mood normal.     ED Results / Procedures / Treatments   Labs (all labs ordered are listed, but only abnormal results are displayed) Labs Reviewed  URINALYSIS, ROUTINE W REFLEX MICROSCOPIC - Abnormal; Notable for the following components:       Result Value   Color, Urine AMBER (*)    APPearance CLOUDY (*)    Ketones, ur 5 (*)    Protein, ur 30 (*)    Leukocytes,Ua TRACE (*)    Bacteria, UA MANY (*)    All other components within normal limits  CBC WITH DIFFERENTIAL/PLATELET - Abnormal; Notable for the following components:   WBC 1.3 (*)    RBC 1.77 (*)    Hemoglobin 7.6 (*)    HCT 21.6 (*)    MCV 122.0 (*)    MCH 42.9 (*)    RDW 15.8 (*)    Platelets 71 (*)    Neutro Abs 1.1 (*)    Lymphs Abs 0.2 (*)    Monocytes Absolute 0.0 (*)    All other components within normal limits  BASIC METABOLIC PANEL - Abnormal; Notable for the following components:   Glucose, Bld 134 (*)    Calcium 8.2 (*)    All other components within normal limits  LACTIC ACID, PLASMA - Abnormal; Notable for the following components:   Lactic Acid, Venous 2.4 (*)    All other components within normal limits  URINE CULTURE  CULTURE, BLOOD (ROUTINE X 2)  CULTURE, BLOOD (ROUTINE X 2)  RESP PANEL BY RT-PCR (FLU A&B, COVID) ARPGX2  LACTIC ACID, PLASMA  VITAMIN B12  HIV ANTIBODY (ROUTINE TESTING W REFLEX)    EKG None  Radiology CT ABDOMEN PELVIS W CONTRAST  Result Date: 08/17/2022 CLINICAL DATA:  Acute abdominal pain. History of uterine/cervical cancer. EXAM: CT ABDOMEN AND PELVIS WITH CONTRAST TECHNIQUE: Multidetector CT imaging of the abdomen and pelvis was performed using the standard protocol following bolus administration of intravenous contrast. RADIATION DOSE REDUCTION: This exam was performed according to the departmental dose-optimization program which includes automated exposure control, adjustment of the mA and/or kV according to patient size and/or use of iterative reconstruction technique. CONTRAST:  145m OMNIPAQUE IOHEXOL 300 MG/ML  SOLN COMPARISON:  CT abdomen and pelvis 05/24/2021 and 06/18/2019. FINDINGS: Lower chest: Stable 6 mm pulmonary nodule in the right lower lobe compatible with benign etiology. Hepatobiliary: No focal liver  abnormality is seen. Status post cholecystectomy. No biliary dilatation. There is a calcified granuloma in the dome of the liver, unchanged. Pancreas: Unremarkable. No pancreatic ductal dilatation or surrounding inflammatory changes. Spleen: Mildly enlarged, unchanged. Adrenals/Urinary Tract: Bilateral renal cysts are present. The largest is in the superior pole the right kidney measuring 2.7 cm. Otherwise, the kidneys and adrenal glands are within normal limits. There is mild bladder wall thickening. Stomach/Bowel: Stomach is within normal limits. Appendix appears normal. No evidence of bowel wall thickening, distention, or inflammatory changes. There is a large amount of stool throughout the colon. Vascular/Lymphatic: No significant vascular findings are present. No enlarged abdominal or pelvic  lymph nodes. Reproductive: Status post hysterectomy. No adnexal masses. Other: No abdominal wall hernia or abnormality. No abdominopelvic ascites. Musculoskeletal: Chronic compression deformity of the superior endplate of L2 is unchanged. No acute fractures are seen. IMPRESSION: 1. Mild bladder wall thickening worrisome for cystitis. 2. No other acute localizing process in the abdomen or pelvis. 3. Stable mild splenomegaly. 4. Right Bosniak I benign renal cyst measuring 2.7 cm. No follow-up imaging is recommended. JACR 2018 Feb; 264-273, Management of the Incidental Renal Mass on CT, [employer] 2021; 814-848, Bosniak Classification of Cystic Renal Masses, Version 2019. Electronically Signed   By: Ronney Asters M.D.   On: 08/17/2022 19:16   CT HEAD WO CONTRAST (5MM)  Result Date: 08/17/2022 CLINICAL DATA:  Dizziness, altered mental status. EXAM: CT HEAD WITHOUT CONTRAST TECHNIQUE: Contiguous axial images were obtained from the base of the skull through the vertex without intravenous contrast. RADIATION DOSE REDUCTION: This exam was performed according to the departmental dose-optimization program which includes  automated exposure control, adjustment of the mA and/or kV according to patient size and/or use of iterative reconstruction technique. COMPARISON:  CT head dated 08/15/2015. FINDINGS: Motion artifact limits the sensitivity of the exam, particularly inferiorly. Brain: No evidence of acute infarction, hemorrhage, hydrocephalus, extra-axial collection or mass lesion/mass effect. Vascular: There are vascular calcifications in the carotid siphons. Skull: Normal. Negative for fracture or focal lesion. Sinuses/Orbits: No acute finding. Other: None. IMPRESSION: 1. No acute intracranial process, however motion artifact limits evaluation of the inferior portion of the head. Electronically Signed   By: Zerita Boers M.D.   On: 08/17/2022 19:12   DG Chest Portable 1 View  Result Date: 08/17/2022 CLINICAL DATA:  Possible sepsis EXAM: PORTABLE CHEST 1 VIEW COMPARISON:  05/04/2019 FINDINGS: The heart size and mediastinal contours are within normal limits. Both lungs are clear. The visualized skeletal structures are unremarkable. IMPRESSION: No active disease. Electronically Signed   By: Elmer Picker M.D.   On: 08/17/2022 16:56    Procedures .Critical Care  Performed by: Regan Lemming, MD Authorized by: Regan Lemming, MD   Critical care provider statement:    Critical care time (minutes):  30   Critical care was necessary to treat or prevent imminent or life-threatening deterioration of the following conditions:  Sepsis   Critical care was time spent personally by me on the following activities:  Development of treatment plan with patient or surrogate, discussions with consultants, evaluation of patient's response to treatment, examination of patient, ordering and review of laboratory studies, ordering and review of radiographic studies, ordering and performing treatments and interventions, pulse oximetry, re-evaluation of patient's condition and review of old charts   Care discussed with: admitting  provider       Medications Ordered in ED Medications  cefTRIAXone (ROCEPHIN) 2 g in sodium chloride 0.9 % 100 mL IVPB (has no administration in time range)  lactated ringers infusion (has no administration in time range)  enoxaparin (LOVENOX) injection 40 mg (has no administration in time range)  cefTRIAXone (ROCEPHIN) 1 g in sodium chloride 0.9 % 100 mL IVPB (1 g Intravenous New Bag/Given 08/17/22 2038)  sodium chloride 0.9 % bolus 1,000 mL (1,000 mLs Intravenous New Bag/Given 08/17/22 1713)  ondansetron (ZOFRAN) injection 4 mg (4 mg Intravenous Given 08/17/22 1714)  cefTRIAXone (ROCEPHIN) 1 g in sodium chloride 0.9 % 100 mL IVPB (1 g Intravenous New Bag/Given 08/17/22 1715)  iohexol (OMNIPAQUE) 300 MG/ML solution 100 mL (100 mLs Intravenous Contrast Given 08/17/22 1842)    ED Course/  Medical Decision Making/ A&P Clinical Course as of 08/17/22 2038  Sat Aug 17, 2022  1810 WBC(!!): 1.3 [JL]  1810 Hemoglobin(!): 7.6 [JL]  1810 Platelets(!): 71 [JL]  1810 Bacteria, UA(!): MANY [JL]  1810 WBC, UA: 21-50 [JL]  1810 Leukocytes,Ua(!): TRACE [JL]  1810 Lactic Acid, Venous(!!): 2.4 [JL]  1930 Leukocytes,Ua(!): TRACE [JL]  1930 Bacteria, UA(!): MANY [JL]  1930 WBC, UA: 21-50 [JL]  1930 Lactic Acid, Venous(!!): 2.4 [JL]    Clinical Course User Index [JL] Regan Lemming, MD                           Medical Decision Making Amount and/or Complexity of Data Reviewed Labs: ordered. Decision-making details documented in ED Course. Radiology: ordered.  Risk Prescription drug management. Decision regarding hospitalization.    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  She states that she has been having suprapubic discomfort.  Additionally, she has  felt somewhat lightheaded and unsteady on her feet over the past few days as well.  She denies any fevers or chills. She denies any dysarthria, dysphagia, facial droop, numbness or weakness. She arrives GCS 15, ABC intact.  On arrival, the patient was borderline febrile T1 100.3, tachycardic P1 29, BP 157/120, saturating 100% on room air.  Sinus tachycardia noted on cardiac telemetry.  Physical exam significant for an abdominal exam with suprapubic tenderness to palpation, no rebound or guarding, normal neurologic exam, lungs clear to auscultation bilaterally.  Differential diagnosis includes sepsis from urinary tract infection, pyelonephritis, considered nephrolithiasis, small bowel obstruction, appendicitis.  Patient is passing gas, she cannot remember the last time she had a bowel movement.  She endorses some lightheadedness and unsteadiness on her feet.  Lower can suspicion for posterior CVA with a reassuring and normal neurologic exam.  CT head was obtained and was generally unremarkable with motion artifact noted.  Chest x-ray performed revealed no acute cardiac or pulm abnormality.  CT abdomen pelvis was performed revealed the following: IMPRESSION:  1. Mild bladder wall thickening worrisome for cystitis.  2. No other acute localizing process in the abdomen or pelvis.  3. Stable mild splenomegaly.  4. Right Bosniak I benign renal cyst measuring 2.7 cm. No follow-up  imaging is recommended.  JACR 2018 Feb; 264-273, Management of the Incidental Renal Mass on  CT, [employer] 2021; 814-848, Bosniak Classification of Cystic  Renal Masses, Version 2019.   Patient laboratory evaluation significant for a pancytopenia with a CBC with a leukopenia to 1.3, anemia to 7.6, thrombocytopenia to 71.  This is new for the patient.  To prior measurements 1 year ago.  Urinalysis positive for trace leukocytes, 21-50 WBCs and many bacteria consistent with likely cystitis which correlates with the patient's CT  imaging.  A BMP was generally unremarkable with mild hypocalcemia 8.2, lactic acid was elevated to 2.4.  Repeat was collected and pending.  The patient was administered broad-spectrum antibiotics with IV Rocephin coverage.  She was administered 1 L IV fluid bolus and IV Zofran for nausea.  Spoke with Dr, Alvy Bimler who will see the patient in consultation. Bone marrow biopsy if needed can be done as early as Monday. She recommended checking a B12 level. The patient has a macrocytic anemia. This was ordered.  I spoke with Dr. Nevada Crane of hospitalist medicine  who subsequently excepted the patient in admission for further care and management.  Final Clinical Impression(s) / ED Diagnoses Final diagnoses:  Lower urinary tract infectious disease  Sepsis, due to unspecified organism, unspecified whether acute organ dysfunction present ([place])  Pancytopenia [facility])    Rx / DC Orders ED Discharge Orders     None         Regan Lemming, MD 08/17/22 2039    Regan Lemming, MD 08/18/22 1712

## 2022-08-17 NOTE — H&P (Signed)
History and Physical  [name] PPJ:[id number] DOB: 1966-04-14 DOA: 08/17/2022  Referring physician: Dr. Armandina Gemma, [place]  PCP: [name], NP  Outpatient Specialists: Medical oncology Patient coming from: Home  Chief Complaint: Burning with urination.  HPI: Jordan [name] is a 56 y.o. 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  She thought by drinking more water it would go away, but it has persisted.  States she has had multiple urinary tract infections in the past and symptoms are similar.  No reported subjective fevers.  Admits to having more chills recently.  Denies having any overt bleeding.  No vagina bleed.  No melena or hematochezia.  In the ED, work-up revealed UA positive for pyuria, leukopenic, tachycardic, tachypneic, with concern for sepsis.  Code sepsis was activated, cultures were obtained, and the patient was started on IV antibiotics empirically, Rocephin.  Lab studies were notable for pancytopenia which is new from prior medical records.  EDP discussed the case with medical oncology, Dr. Alvy Bimler, who will see the patient in consultation on Monday 08/19/22.  The patient was admitted by [facility], hospitalist medicine.  ED Course: Tmax 100.3.  BP 111/64, pulse 110, respiration rate 26, O2 saturation 94% on room air.  Lab studies remarkable for serum glucose 134, WBC 1.3, hemoglobin 7.6, platelet count 71.  COVID-19 screening test pending.  Review of Systems: Review of systems as noted in the HPI. All other systems reviewed and are negative.   Past Medical History:  Diagnosis Date   Alcohol abuse    Anemia    patient denies   Bipolar 1 disorder ([place])    No  medications currently   CAP (community acquired pneumonia) 03/17/2015   History of radiation therapy 08/24/19-09/22/19   Vaginal brachytherapy   Dr. 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   Mental disorder    Obesity    PICC line infection 05/17/2015   Sepsis due to Gram negative bacteria (MDR E Coli) 02/18/2015   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HCC)    UTI (lower urinary tract infection)    Vaginal Pap smear, abnormal    Past Surgical History:  Procedure Laterality Date   ABDOMINAL HYSTERECTOMY     CERVICAL CONIZATION W/BX N/A 07/01/2016   Procedure: CONIZATION CERVIX WITH BIOPSY;  Surgeon: Chancy Milroy, MD;  Location: [facility];  Service: Gynecology;  Laterality: N/A;   CHOLECYSTECTOMY     EXTERNAL FIXATION LEG Right 04/09/2020   Procedure: EXTERNAL FIXATION LEG;  Surgeon: Hiram Gash, MD;  Location: [place];  Service: Orthopedics;  Laterality: Right;   HYSTEROSCOPY WITH D & C N/A 01/25/2016   Procedure: DILATATION AND CURETTAGE /HYSTEROSCOPY;  Surgeon: Mora Bellman, MD;  Location: [facility];  Service: Gynecology;  Laterality: N/A;   LYMPH NODE BIOPSY Bilateral 07/22/2019   Procedure: LYMPH NODE BIOPSY;  Surgeon: Everitt Amber, MD;  Location: [facility];  Service: Gynecology;  Laterality: Bilateral;   OPEN REDUCTION INTERNAL FIXATION (ORIF) TIBIA/FIBULA FRACTURE Right 04/10/2020   Procedure: OPEN REDUCTION INTERNAL FIXATION (ORIF) TIBIA/FIBULA FRACTURE;  Surgeon: Shona Needles, MD;  Location: [place];  Service: Orthopedics;  Laterality: Right;   ROBOT ASSISTED MYOMECTOMY N/A 07/22/2019   Procedure: XI ROBOTIC  ASSISTED LAPAROSCOPIC RADICAL UPPER VAGINECTOMY, LEFT SALPINECTOMY, RIGHT SALPINGOOOPHERECTOMY;  Surgeon: Everitt Amber, MD;  Location: [facility];  Service: Gynecology;  Laterality: N/A;   VAGINAL HYSTERECTOMY N/A 03/11/2017   Procedure: HYSTERECTOMY VAGINAL WITH MORCELLATION;  Surgeon: Chancy Milroy, MD;  Location: [facility];  Service: Gynecology;  Laterality:  N/A;    Social History:  reports that she has never smoked. She has never used smokeless tobacco. She reports that she does not currently use alcohol. She reports that she does not use drugs.   Allergies  Allergen Reactions   Ciprofloxacin Swelling    Lips swell, tongue swells, face swells   Citalopram Other (See Comments)    Possible cause of pancytopenia. Swelling of tongue, face and throat   Lamotrigine Other (See Comments)    Possible cause of pancytopenia. Swelling of face, throat and tongue   Sulfa Antibiotics Anaphylaxis   Tramadol Anaphylaxis, Shortness Of Breath and Swelling    Family History  Problem Relation Age of Onset   Alcohol abuse Father    Cancer Father    Breast cancer Neg Hx       Prior to Admission medications   Medication Sig Start Date End Date Taking? Authorizing Provider  Ferrous Sulfate (IRON) 325 (65 Fe) MG TABS Take 1 tablet by mouth every other day. 02/03/20   [provider]  fluconazole (DIFLUCAN) 100 MG tablet Take 1 tablet (100 mg total) by mouth daily. 07/23/22   Gery Pray, MD  gabapentin (NEURONTIN) 800 MG tablet Take 800 mg by mouth 3 (three) times daily. 01/11/22   [provider]  metroNIDAZOLE (METROGEL VAGINAL) 0.75 % vaginal gel Place 1 Applicatorful vaginally at bedtime. For 7 days 07/16/22   Gery Pray, MD  naproxen (NAPROSYN) 500 MG tablet Take 500 mg by mouth 2 (two) times daily with a meal.    [provider]  nitrofurantoin, macrocrystal-monohydrate, (MACROBID) 100 MG capsule Take 1 capsule (100 mg total) by mouth 2 (two) times daily. 07/25/22   Gery Pray, MD  oxybutynin (DITROPAN XL) 15 MG 24 hr tablet Take 15 mg by mouth 2 (two) times daily. 01/25/21   [provider]  Oxycodone HCl 10 MG TABS Take 15 mg by mouth. 01/27/20   [provider]  phenazopyridine (PYRIDIUM) 200 MG tablet Take 1 tablet (200 mg total) by mouth 3 (three) times daily as needed for pain. 07/16/22   Gery Pray,  MD  Vitamin D, Ergocalciferol, (DRISDOL) 1.25 MG (50000 UNIT) CAPS capsule Take 50,000 Units by mouth. Every 14 days 11/13/20   [provider]    Physical Exam: BP 111/64   Pulse (!) 106   Temp 100.3 F (37.9 C) (Oral)   Resp 14   Ht '5\' 8"'$  (1.727 m)   Wt 104.3 kg   LMP  (LMP Unknown)   SpO2 99%   BMI 34.97 kg/m   General: 56 y.o. year-old female well developed well nourished in no acute distress.  Alert and oriented x3. Cardiovascular: Tachycardic with no rubs or gallops.  No thyromegaly or JVD noted.  No lower extremity edema. 2/4 pulses in all 4 extremities. Respiratory: Clear to auscultation with no wheezes or rales. Good inspiratory effort. Abdomen: Soft nontender nondistended with normal bowel sounds x4 quadrants. Muskuloskeletal: No cyanosis, clubbing or edema noted bilaterally Neuro: CN II-XII intact, strength, sensation, reflexes Skin: No ulcerative lesions noted or rashes Psychiatry: Judgement and insight appear normal. Mood is appropriate for condition and setting  Labs on Admission:  Basic Metabolic Panel: Recent Labs  Lab 08/17/22 1719  NA 139  K 3.5  CL 109  CO2 24  GLUCOSE 134*  BUN 11  CREATININE 0.66  CALCIUM 8.2*   Liver Function Tests: No results for input(s): "AST", "ALT", "ALKPHOS", "BILITOT", "PROT", "ALBUMIN" in the last 168 hours. No results for input(s): "LIPASE", "AMYLASE" in the last 168 hours. No results for input(s): "AMMONIA" in the last 168 hours. CBC: Recent Labs  Lab 08/17/22 1719  WBC 1.3*  NEUTROABS 1.1*  HGB 7.6*  HCT 21.6*  MCV 122.0*  PLT 71*   Cardiac Enzymes: No results for input(s): "CKTOTAL", "CKMB", "CKMBINDEX", "TROPONINI" in the last 168 hours.  BNP (last 3 results) No results for input(s): "BNP" in the last 8760 hours.  ProBNP (last 3 results) No results for input(s): "PROBNP" in the last 8760 hours.  CBG: No results for input(s): "GLUCAP" in the last 168 hours.  Radiological Exams on  Admission: CT ABDOMEN PELVIS W CONTRAST  Result Date: 08/17/2022 CLINICAL DATA:  Acute abdominal pain. History of uterine/cervical cancer. EXAM: CT ABDOMEN AND PELVIS WITH CONTRAST TECHNIQUE: Multidetector CT imaging of the abdomen and pelvis was performed using the standard protocol following bolus administration of intravenous contrast. RADIATION DOSE REDUCTION: This exam was performed according to the departmental dose-optimization program which includes automated exposure control, adjustment of the mA and/or kV according to patient size and/or use of iterative reconstruction technique. CONTRAST:  166m OMNIPAQUE IOHEXOL 300 MG/ML  SOLN COMPARISON:  CT abdomen and pelvis 05/24/2021 and 06/18/2019. FINDINGS: Lower chest: Stable 6 mm pulmonary nodule in the right lower lobe compatible with benign etiology. Hepatobiliary: No focal liver abnormality is seen. Status post cholecystectomy. No biliary dilatation. There is a calcified granuloma in the dome of the liver, unchanged. Pancreas: Unremarkable. No pancreatic ductal dilatation or surrounding inflammatory changes. Spleen: Mildly enlarged, unchanged. Adrenals/Urinary Tract: Bilateral renal cysts are present. The largest is in the superior pole the right kidney measuring 2.7 cm. Otherwise, the kidneys and adrenal glands are within normal limits. There is mild bladder wall thickening. Stomach/Bowel: Stomach is within normal limits. Appendix appears normal. No evidence of bowel wall thickening, distention, or inflammatory changes. There is a large amount of stool throughout the colon. Vascular/Lymphatic: No significant vascular findings are present. No enlarged abdominal or pelvic lymph nodes. Reproductive: Status post hysterectomy. No adnexal masses. Other: No abdominal wall hernia or abnormality. No abdominopelvic ascites. Musculoskeletal: Chronic compression deformity of the superior endplate of L2 is unchanged. No acute fractures are seen. IMPRESSION: 1. Mild  bladder wall thickening worrisome for cystitis. 2. No other acute localizing process in the abdomen or pelvis. 3. Stable mild splenomegaly. 4. Right Bosniak I benign renal cyst measuring 2.7 cm. No follow-up imaging is recommended. JACR 2018 Feb; 264-273, Management of the Incidental Renal Mass on CT, [employer] 2021; 814-848, Bosniak Classification of Cystic Renal Masses, Version 2019. Electronically Signed   By: ARonney AstersM.D.   On: 08/17/2022 19:16   CT HEAD WO CONTRAST (5MM)  Result Date: 08/17/2022 CLINICAL DATA:  Dizziness, altered mental status. EXAM: CT HEAD WITHOUT CONTRAST TECHNIQUE: Contiguous axial images were obtained from the base of the skull through the vertex without intravenous contrast. RADIATION DOSE REDUCTION: This exam was performed according to the departmental dose-optimization program which includes automated exposure control, adjustment of the mA and/or kV according to patient size and/or use of iterative reconstruction technique. COMPARISON:  CT head dated 08/15/2015. FINDINGS: Motion artifact limits the sensitivity  of the exam, particularly inferiorly. Brain: No evidence of acute infarction, hemorrhage, hydrocephalus, extra-axial collection or mass lesion/mass effect. Vascular: There are vascular calcifications in the carotid siphons. Skull: Normal. Negative for fracture or focal lesion. Sinuses/Orbits: No acute finding. Other: None. IMPRESSION: 1. No acute intracranial process, however motion artifact limits evaluation of the inferior portion of the head. Electronically Signed   By: Zerita Boers M.D.   On: 08/17/2022 19:12   DG Chest Portable 1 View  Result Date: 08/17/2022 CLINICAL DATA:  Possible sepsis EXAM: PORTABLE CHEST 1 VIEW COMPARISON:  05/04/2019 FINDINGS: The heart size and mediastinal contours are within normal limits. Both lungs are clear. The visualized skeletal structures are unremarkable. IMPRESSION: No active disease. Electronically Signed   By: Elmer Picker M.D.   On: 08/17/2022 16:56    EKG: I independently viewed the EKG done and my findings are as followed: Sinus rhythm rate of 74.  Nonspecific ST-T changes.  QTc 430.  Assessment/Plan Present on Admission:  Complicated UTI (urinary tract infection)  Principal Problem:   Complicated UTI (urinary tract infection)  Sepsis secondary to complicated UTI with history of radiation cystitis, POA Presented with dysuria, UA positive for pyuria, WBC 1.3, pulse 110, respiratory rate 26 Started on Rocephin in the ED, continue Follow urine culture and blood cultures for ID and sensitivities. Monitor fever curve and WBCs Gentle IV fluid hydration LR at 50 cc/h x 2 days Maintain MAP greater than 65  Squamous cell carcinoma of the vagina status post radiation Follows with radiation oncology outpatient  New pancytopenia, unclear etiology EDP discussed the case with Dr. Alvy Bimler, medical oncology, who will see the patient on Monday. Management per medical oncology  Anemia of chronic disease Hemoglobin 7.6 No overt bleeding reported Monitor H&H Transfuse hemoglobin less than 7.0 or if symptomatic with hemoglobin of less than 8.0.    DVT prophylaxis: Subcu Lovenox daily  Code Status: Full code  Family Communication: None at bedside  Disposition Plan: Admitted to telemetry unit  Consults called: Medical oncology consulted by EDP  Admission status: Inpatient status   Status is: Inpatient The patient requires at least 2 midnights for further evaluation and treatment of present condition   Kayleen Memos MD [facility] Pager [phone]  If 7PM-7AM, please contact night-coverage [email or web address] Password [email or web address]  08/17/2022, 7:52 PM

## 2022-08-17 NOTE — ED Triage Notes (Signed)
Pt arrived via EMS, c/o dysuria, urinary frequency. States multiple UTIs in the past.

## 2022-08-18 DIAGNOSIS — N390 Urinary tract infection, site not specified: Secondary | ICD-10-CM | POA: Diagnosis not present

## 2022-08-18 LAB — CBC WITH DIFFERENTIAL/PLATELET
Abs Immature Granulocytes: 0.01 10*3/uL (ref 0.00–0.07)
Basophils Absolute: 0 10*3/uL (ref 0.0–0.1)
Basophils Relative: 0 %
Eosinophils Absolute: 0 10*3/uL (ref 0.0–0.5)
Eosinophils Relative: 0 %
HCT: 18.2 % — ABNORMAL LOW (ref 36.0–46.0)
Hemoglobin: 6.4 g/dL — CL (ref 12.0–15.0)
Immature Granulocytes: 1 %
Lymphocytes Relative: 47 %
Lymphs Abs: 0.8 10*3/uL (ref 0.7–4.0)
MCH: 44.8 pg — ABNORMAL HIGH (ref 26.0–34.0)
MCHC: 35.2 g/dL (ref 30.0–36.0)
MCV: 127.3 fL — ABNORMAL HIGH (ref 80.0–100.0)
Monocytes Absolute: 0.1 10*3/uL (ref 0.1–1.0)
Monocytes Relative: 3 %
Neutro Abs: 0.8 10*3/uL — ABNORMAL LOW (ref 1.7–7.7)
Neutrophils Relative %: 49 %
Platelets: 62 10*3/uL — ABNORMAL LOW (ref 150–400)
RBC: 1.43 MIL/uL — ABNORMAL LOW (ref 3.87–5.11)
RDW: 15.4 % (ref 11.5–15.5)
WBC: 1.6 10*3/uL — ABNORMAL LOW (ref 4.0–10.5)
nRBC: 0 % (ref 0.0–0.2)

## 2022-08-18 LAB — COMPREHENSIVE METABOLIC PANEL
ALT: 13 U/L (ref 0–44)
AST: 16 U/L (ref 15–41)
Albumin: 3 g/dL — ABNORMAL LOW (ref 3.5–5.0)
Alkaline Phosphatase: 83 U/L (ref 38–126)
Anion gap: 2 — ABNORMAL LOW (ref 5–15)
BUN: 11 mg/dL (ref 6–20)
CO2: 27 mmol/L (ref 22–32)
Calcium: 8.1 mg/dL — ABNORMAL LOW (ref 8.9–10.3)
Chloride: 107 mmol/L (ref 98–111)
Creatinine, Ser: 0.51 mg/dL (ref 0.44–1.00)
GFR, Estimated: >60 mL/min (ref >60)
Glucose, Bld: 107 mg/dL — ABNORMAL HIGH (ref 70–99)
Potassium: 4.5 mmol/L (ref 3.5–5.1)
Sodium: 136 mmol/L (ref 135–145)
Total Bilirubin: 1.1 mg/dL (ref 0.3–1.2)
Total Protein: 5.1 g/dL — ABNORMAL LOW (ref 6.5–8.1)

## 2022-08-18 LAB — HIV ANTIBODY (ROUTINE TESTING W REFLEX): HIV Screen 4th Generation wRfx: NONREACTIVE

## 2022-08-18 LAB — MAGNESIUM: Magnesium: 2.1 mg/dL (ref 1.7–2.4)

## 2022-08-18 LAB — PREPARE RBC (CROSSMATCH)

## 2022-08-18 LAB — PHOSPHORUS: Phosphorus: 3.4 mg/dL (ref 2.5–4.6)

## 2022-08-18 MED ORDER — VITAMIN B-12 1000 MCG PO TABS: 1000.0000 ug | ORAL_TABLET | Freq: Every day | ORAL | Status: DC

## 2022-08-18 MED ORDER — FOLIC ACID 1 MG PO TABS
1.0000 mg | ORAL_TABLET | Freq: Every day | ORAL | Status: DC
  Administered 2022-08-18 – 2022-08-22 (×5): 1 mg via ORAL
  Filled 2022-08-18 (×5): qty 1

## 2022-08-18 MED ORDER — CYANOCOBALAMIN 1000 MCG/ML IJ SOLN
1000.0000 ug | INTRAMUSCULAR | Status: DC
  Administered 2022-08-18 – 2022-08-20 (×3): 1000 ug via INTRAMUSCULAR
  Filled 2022-08-18 (×3): qty 1

## 2022-08-18 MED ORDER — SODIUM CHLORIDE 0.9% IV SOLUTION: Freq: Once | INTRAVENOUS | Status: AC

## 2022-08-18 MED ORDER — ESTRADIOL 0.1 MG/GM VA CREA
1.0000 | TOPICAL_CREAM | Freq: Every day | VAGINAL | Status: DC
  Administered 2022-08-18 – 2022-08-21 (×4): 1 via VAGINAL
  Filled 2022-08-18 (×2): qty 42.5

## 2022-08-18 NOTE — Progress Notes (Signed)
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  Poor historian.  Hemodynamically stable in the ER.  Found to have pancytopenia and anemia as below.   Assessment & Plan:   Sepsis secondary to complicated UTI with history of radiation cystitis present on admission: Presented with symptomatology of UTI, leukopenia. Blood cultures and urine cultures pending.  Currently remains on Rocephin that we will continue.  Continue maintenance IV fluids and supportive therapies. She gets recurrent UTIs due to history of radiation cystitis and also being menopausal now.  She may benefit with long-term maintenance antibiotic therapy. She is seeing gynecologist, may benefit with estrogen suppositories.  Will defer to them.  Pancytopenia B12 levels 50, start oral and intramuscular replacement, will suggest aggressive replacement with intramuscular B12 while in the hospital and will need weekly injections. Hemoglobin is 6.4, iron levels are adequate.  1 unit of PRBC transfusion today.  Patient consented. Sudden drop of all cell lines with no explanation, oncology will follow.      DVT prophylaxis: enoxaparin (LOVENOX) injection 40 mg Start: 08/17/22 2200   Code Status: Full code Family Communication: None at the bedside Disposition Plan: Status is: Inpatient Remains inpatient appropriate because: Blood transfusion, antibiotics     Consultants:  Hematology oncology  Procedures:  None  Antimicrobials:  Rocephin 2 g IV daily 10/28---   Subjective: Patient seen and examined in the morning rounds.  Poor historian.  She keeps repeating same thing and she is  saying "my boyfriend who is dead now" approximately 10 times during encounter.  Afebrile.  Urinating without trouble.  On room air.  Objective: Vitals:   08/17/22 2147 08/18/22 0153 08/18/22 0600 08/18/22 1003  BP: 94/66 125/63 (!) 94/49 110/64  Pulse: 94 89 81 94  Resp: '19 16 18 18  '$ Temp: 98.2 F (36.8 C) 98.3 F (36.8 C) 98.5 F (36.9 C) 98.9 F (37.2 C)  TempSrc: Oral Oral    SpO2: 100% 100% (!) 88% 96%  Weight:      Height:        Intake/Output Summary (Last 24 hours) at 08/18/2022 1117 Last data filed at 08/18/2022 0700 Gross per 24 hour  Intake 428 ml  Output 950 ml  Net -522 ml   Filed Weights   08/17/22 1742  Weight: 104.3 kg    Examination:  General exam: Appears calm and comfortable  Appropriately anxious.  Impulsive and repetitive. Respiratory system: Clear to auscultation. Respiratory effort normal.  No added sounds. Cardiovascular system: S1 & S2 heard, RRR. No pedal edema. Gastrointestinal system: Soft.  Nontender.  Nondistended.  Bowel sound present. Central nervous system: Alert and oriented. No focal neurological deficits. Extremities: Symmetric 5 x 5 power. Skin: No rashes, lesions or ulcers Psychiatry: Judgement and insight appear normal.  Anxious mood and flat affect.    Data Reviewed: I have personally reviewed following labs and imaging studies  CBC: Recent Labs  Lab 08/17/22 1719 08/18/22 0523  WBC 1.3* 1.6*  NEUTROABS 1.1* 0.8*  HGB 7.6* 6.4*  HCT 21.6* 18.2*  MCV 122.0* 127.3*  PLT 71* 62*   Basic Metabolic Panel: Recent Labs  Lab 08/17/22 1719 08/18/22 0523  NA 139 136  K 3.5 4.5  CL 109 107  CO2 24 27  GLUCOSE 134* 107*  BUN 11 11  CREATININE 0.66 0.51  CALCIUM 8.2* 8.1*  MG  --  2.1  PHOS  --  3.4   GFR: Estimated Creatinine Clearance: 101.7 mL/min (by C-G formula based on SCr of 0.51 mg/dL). Liver Function Tests: Recent Labs  Lab 08/18/22 0523  AST 16  ALT 13  ALKPHOS 83  BILITOT 1.1  PROT 5.1*  ALBUMIN  3.0*   No results for input(s): "LIPASE", "AMYLASE" in the last 168 hours. No results for input(s): "AMMONIA" in the last 168 hours. Coagulation Profile: No results for input(s): "INR", "PROTIME" in the last 168 hours. Cardiac Enzymes: No results for input(s): "CKTOTAL", "CKMB", "CKMBINDEX", "TROPONINI" in the last 168 hours. BNP (last 3 results) No results for input(s): "PROBNP" in the last 8760 hours. HbA1C: No results for input(s): "HGBA1C" in the last 72 hours. CBG: No results for input(s): "GLUCAP" in the last 168 hours. Lipid Profile: No results for input(s): "CHOL", "HDL", "LDLCALC", "TRIG", "CHOLHDL", "LDLDIRECT" in the last 72 hours. Thyroid Function Tests: No results for input(s): "TSH", "T4TOTAL", "FREET4", "T3FREE", "THYROIDAB" in the last 72 hours. Anemia Panel: Recent Labs    08/17/22 2032  VITAMINB12 <50*   Sepsis Labs: Recent Labs  Lab 08/17/22 1718 08/17/22 2032  LATICACIDVEN 2.4* 1.0    Recent Results (from the past 240 hour(s))  Blood culture (routine x 2)     Status: None (Preliminary result)   Collection Time: 08/17/22  5:00 PM   Specimen: BLOOD  Result Value Ref Range Status   Specimen Description BLOOD SITE NOT SPECIFIED  Final   Special Requests   Final    BOTTLES DRAWN AEROBIC AND ANAEROBIC Blood Culture results may not be optimal due to an excessive volume of blood received in culture bottles Performed at Post [address] [place], [place] [address]    Culture PENDING  Incomplete   Report Status PENDING  Incomplete  Resp Panel by RT-PCR (Flu A&B, Covid) Anterior Nasal Swab     Status: None   Collection Time: 08/17/22  8:34 PM   Specimen: Anterior Nasal Swab  Result Value Ref Range Status   SARS Coronavirus 2 by RT PCR NEGATIVE NEGATIVE Final    Comment: (NOTE) SARS-CoV-2 target nucleic acids are NOT DETECTED.  The SARS-CoV-2 RNA is generally detectable in upper respiratory specimens during the acute phase of infection.  The lowest concentration of SARS-CoV-2 viral copies this assay can detect is 138 copies/mL. A negative result does not preclude SARS-Cov-2 infection and should not be used as the sole basis for treatment or other patient management decisions. A negative result may occur with  improper specimen collection/handling, submission of specimen other than nasopharyngeal swab, presence of viral mutation(s) within the areas targeted by this assay, and inadequate number of viral copies(<138 copies/mL). A negative result must be combined with clinical observations, patient history, and epidemiological information. The expected result is Negative.  Fact Sheet for Patients:  EntrepreneurPulse.com.au  Fact Sheet for Healthcare Providers:  IncredibleEmployment.be  This test is no t yet approved or cleared by the [place] FDA and  has been authorized for detection and/or diagnosis of SARS-CoV-2 by FDA under an Emergency Use Authorization (EUA). This EUA will remain  in effect (meaning this test can be used) for the duration of the COVID-19 declaration under Section 564(b)(1) of the Act, 21 U.S.C.section 360bbb-3(b)(1), unless the authorization is terminated  or revoked sooner.  Influenza A by PCR NEGATIVE NEGATIVE Final   Influenza B by PCR NEGATIVE NEGATIVE Final    Comment: (NOTE) The Xpert Xpress SARS-CoV-2/FLU/RSV plus assay is intended as an aid in the diagnosis of influenza from Nasopharyngeal swab specimens and should not be used as a sole basis for treatment. Nasal washings and aspirates are unacceptable for Xpert Xpress SARS-CoV-2/FLU/RSV testing.  Fact Sheet for Patients: EntrepreneurPulse.com.au  Fact Sheet for Healthcare Providers: IncredibleEmployment.be  This test is not yet approved or cleared by the [place] FDA and has been authorized for detection and/or diagnosis of SARS-CoV-2 by FDA  under an Emergency Use Authorization (EUA). This EUA will remain in effect (meaning this test can be used) for the duration of the COVID-19 declaration under Section 564(b)(1) of the Act, 21 U.S.C. section 360bbb-3(b)(1), unless the authorization is terminated or revoked.  Performed at [facility], [place] [address], [place], [place] [address]          Radiology Studies: CT ABDOMEN PELVIS W CONTRAST  Result Date: 08/17/2022 CLINICAL DATA:  Acute abdominal pain. History of uterine/cervical cancer. EXAM: CT ABDOMEN AND PELVIS WITH CONTRAST TECHNIQUE: Multidetector CT imaging of the abdomen and pelvis was performed using the standard protocol following bolus administration of intravenous contrast. RADIATION DOSE REDUCTION: This exam was performed according to the departmental dose-optimization program which includes automated exposure control, adjustment of the mA and/or kV according to patient size and/or use of iterative reconstruction technique. CONTRAST:  125m OMNIPAQUE IOHEXOL 300 MG/ML  SOLN COMPARISON:  CT abdomen and pelvis 05/24/2021 and 06/18/2019. FINDINGS: Lower chest: Stable 6 mm pulmonary nodule in the right lower lobe compatible with benign etiology. Hepatobiliary: No focal liver abnormality is seen. Status post cholecystectomy. No biliary dilatation. There is a calcified granuloma in the dome of the liver, unchanged. Pancreas: Unremarkable. No pancreatic ductal dilatation or surrounding inflammatory changes. Spleen: Mildly enlarged, unchanged. Adrenals/Urinary Tract: Bilateral renal cysts are present. The largest is in the superior pole the right kidney measuring 2.7 cm. Otherwise, the kidneys and adrenal glands are within normal limits. There is mild bladder wall thickening. Stomach/Bowel: Stomach is within normal limits. Appendix appears normal. No evidence of bowel wall thickening, distention, or inflammatory changes. There is a large amount of stool throughout  the colon. Vascular/Lymphatic: No significant vascular findings are present. No enlarged abdominal or pelvic lymph nodes. Reproductive: Status post hysterectomy. No adnexal masses. Other: No abdominal wall hernia or abnormality. No abdominopelvic ascites. Musculoskeletal: Chronic compression deformity of the superior endplate of L2 is unchanged. No acute fractures are seen. IMPRESSION: 1. Mild bladder wall thickening worrisome for cystitis. 2. No other acute localizing process in the abdomen or pelvis. 3. Stable mild splenomegaly. 4. Right Bosniak I benign renal cyst measuring 2.7 cm. No follow-up imaging is recommended. JACR 2018 Feb; 264-273, Management of the Incidental Renal Mass on CT, [employer] 2021; [id number], Bosniak Classification of Cystic Renal Masses, Version 2019. Electronically Signed   By: ARonney AstersM.D.   On: 08/17/2022 19:16   CT HEAD WO CONTRAST (5MM)  Result Date: 08/17/2022 CLINICAL DATA:  Dizziness, altered mental status. EXAM: CT HEAD WITHOUT CONTRAST TECHNIQUE: Contiguous axial images were obtained from the base of the skull through the vertex without intravenous contrast. RADIATION DOSE REDUCTION: This exam was performed according to the departmental dose-optimization program which includes automated exposure control, adjustment of the mA and/or kV according to patient size and/or use of iterative reconstruction technique. COMPARISON:  CT head dated 08/15/2015. FINDINGS: Motion artifact limits  the sensitivity of the exam, particularly inferiorly. Brain: No evidence of acute infarction, hemorrhage, hydrocephalus, extra-axial collection or mass lesion/mass effect. Vascular: There are vascular calcifications in the carotid siphons. Skull: Normal. Negative for fracture or focal lesion. Sinuses/Orbits: No acute finding. Other: None. IMPRESSION: 1. No acute intracranial process, however motion artifact limits evaluation of the inferior portion of the head. Electronically Signed   By:  Zerita Boers M.D.   On: 08/17/2022 19:12   DG Chest Portable 1 View  Result Date: 08/17/2022 CLINICAL DATA:  Possible sepsis EXAM: PORTABLE CHEST 1 VIEW COMPARISON:  05/04/2019 FINDINGS: The heart size and mediastinal contours are within normal limits. Both lungs are clear. The visualized skeletal structures are unremarkable. IMPRESSION: No active disease. Electronically Signed   By: Elmer Picker M.D.   On: 08/17/2022 16:56        Scheduled Meds:  sodium chloride   Intravenous Once   cyanocobalamin  1,000 mcg Intramuscular Q24H   [START ON 08/21/2022] vitamin B-12  1,000 mcg Oral Daily   enoxaparin (LOVENOX) injection  40 mg Subcutaneous Q24H   gabapentin  400 mg Oral TID   Continuous Infusions:  cefTRIAXone (ROCEPHIN)  IV     lactated ringers 50 mL/hr at 08/17/22 2223     LOS: 1 day    Time spent: 35 minutes    Barb Merino, MD [facility] Pager [phone]

## 2022-08-18 NOTE — Consult Note (Signed)
[facility] CONSULT NOTE  Patient Care Team: [name], NP as PCP - General (Nurse Practitioner)  ASSESSMENT & PLAN:  Severe pancytopenia, likely secondary to severe vitamin B12 deficiency She had remote history of pancytopenia in 2016 that has subsequently resolved Repeat blood work shows severe vitamin B12 deficiency I recommend blood transfusion support to keep hemoglobin greater than 8 and to institute daily vitamin B12 injection I will add folic acid for effective erythropoiesis  Significant dysuria, suspect vaginal atrophy/vaginitis Recommend trial of daily topical Estrace for a month before transitioning her to 3 times a week dose  History of vaginal cancer No evidence of disease on recent exam She will follow-up with radiation oncologist and GYN  Discharge planning She will likely be here for the next day or 2  I will check on her tomorrow   All questions were answered. The patient knows to call the clinic with any problems, questions or concerns.  The total time spent in the appointment was 80 minutes encounter with patients including review of chart and various tests results, discussions about plan of care and coordination of care plan  Heath Lark, MD 10/29/202312:58 PM   CHIEF COMPLAINTS/PURPOSE OF CONSULTATION:  Severe pancytopenia  HISTORY OF PRESENTING ILLNESS:  Jordan [name] 56 y.o. female is here because of severe pancytopenia The patient is a poor historian She lives by herself She was noted to have abnormal CBC in 2016 requiring bone marrow aspirate and biopsy  On 02/20/2015, she underwent bone marrow aspirate and biopsy, ordered by Dr. Beryle Beams.  The patient did not recall having the bone marrow biopsy done.  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 - SEE COMMENT. PERIPHERAL  BLOOD: - PANCYTOPENIA Diagnosis Note The bone marrow is hypercellular for age with trilineage hematopoiesis but with striking megaloblastic changes essentially involving all cell lines. No increase in blastic cells is identified by morphology or flow cytometric studies. The overall morphologic features may be due nutritional deficiency (vitamin B12, folate), medication (antibiotics, anticonvulsants, etc.), liver disease, thyroid disease, toxins, alcohol. While myelodysplasia is also in the differential diagnosis, it is not favored at this time. Clinical and cytogenetic correlation is recommended.  At that time, her white blood cell count was 1.3, hemoglobin 7.0, MCV of 105.1 and platelet count of 90,000  At that time, her B12 level was normal Her most recent normal CBC dated August 13, 2020 showed white count of 6.0, hemoglobin 14.9 and platelet count 171,000 On October 08, 2021, her white blood cell count was 4.6, hemoglobin 14.4 and platelet count 126,000 On admission, on August 17, 2022, her white blood cell count was 1.3, hemoglobin 7.6, MCV 122 and platelet count of 71,000 This morning, it dropped further with a white count of 1.6, hemoglobin 6.4, MCV of 127.3 and platelet count of 62 She was receiving blood transfusion when I entered the room She denies dizziness, chest pain or shortness of breath She had not noticed any recent bleeding such as epistaxis, hematuria or hematochezia The patient denies over the counter NSAID ingestion. She is not on antiplatelets agents. She has never have endoscopy evaluation She denies any pica and eats a variety of diet.  Of note, vitamin B12 level was drawn in the emergency department and it came back less than 50 She denies smoking or alcohol intake Denies over-the-counter supplement  She has history of vaginal cancer status post  radiation treatment Her last radiation treatment was in 2020 in which she was evaluated by Dr. Randa Ngo on July 23, 2022 She has chronic discomfort, vaginal discharge and severe dysuria since completion of radiation treatment, and was prescribed Diflucan She continues to have chronic symptoms of dysuria for a long time  MEDICAL HISTORY:  Past Medical History:  Diagnosis Date   Alcohol abuse    Anemia    patient denies   Bipolar 1 disorder ([place])    No medications currently   CAP (community acquired pneumonia) 03/17/2015   History of radiation therapy 08/24/19-09/22/19   Vaginal brachytherapy   Dr. 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   Mental disorder    Obesity    PICC line infection 05/17/2015   Sepsis due to Gram negative bacteria (MDR E Coli) 02/18/2015   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HCC)    UTI (lower urinary tract infection)    Vaginal Pap smear, abnormal     SURGICAL HISTORY: Past Surgical History:  Procedure Laterality Date   ABDOMINAL HYSTERECTOMY     CERVICAL CONIZATION W/BX N/A 07/01/2016   Procedure: CONIZATION CERVIX WITH BIOPSY;  Surgeon: Chancy Milroy, MD;  Location: [facility];  Service: Gynecology;  Laterality: N/A;   CHOLECYSTECTOMY     EXTERNAL FIXATION LEG Right 04/09/2020   Procedure: EXTERNAL FIXATION LEG;  Surgeon: Hiram Gash, MD;  Location: [facility];  Service: Orthopedics;  Laterality: Right;   HYSTEROSCOPY WITH D & C N/A 01/25/2016   Procedure: DILATATION AND CURETTAGE /HYSTEROSCOPY;  Surgeon: Mora Bellman, MD;  Location: [facility];  Service: Gynecology;  Laterality: N/A;   LYMPH NODE BIOPSY Bilateral 07/22/2019   Procedure: LYMPH NODE BIOPSY;  Surgeon: Everitt Amber, MD;  Location: [facility];  Service: Gynecology;  Laterality: Bilateral;   OPEN REDUCTION INTERNAL FIXATION (ORIF) TIBIA/FIBULA FRACTURE Right 04/10/2020   Procedure: OPEN REDUCTION INTERNAL FIXATION (ORIF) TIBIA/FIBULA FRACTURE;  Surgeon: Shona Needles, MD;  Location: [facility];  Service: Orthopedics;  Laterality: Right;   ROBOT ASSISTED MYOMECTOMY N/A 07/22/2019   Procedure:  XI ROBOTIC ASSISTED LAPAROSCOPIC RADICAL UPPER VAGINECTOMY, LEFT SALPINECTOMY, RIGHT SALPINGOOOPHERECTOMY;  Surgeon: Everitt Amber, MD;  Location: [facility];  Service: Gynecology;  Laterality: N/A;   VAGINAL HYSTERECTOMY N/A 03/11/2017   Procedure: HYSTERECTOMY VAGINAL WITH MORCELLATION;  Surgeon: Chancy Milroy, MD;  Location: [facility];  Service: Gynecology;  Laterality: N/A;    SOCIAL HISTORY: Social History   Socioeconomic History   Marital status: Soil scientist    Spouse name: Not on file   Number of children: Not on file   Years of education: Not on file   Highest education level: Not on file  Occupational History   Not on file  Tobacco Use   Smoking status: Never   Smokeless tobacco: Never  Vaping Use   Vaping Use: Never used  Substance and Sexual Activity   Alcohol use: Not Currently   Drug use: No   Sexual activity: Yes    Birth control/protection: Post-menopausal, Surgical    Comment: perimenopausal; no sex in years  Other Topics Concern   Not on file  Social History Narrative   Not on file   Social Determinants of Health   Financial Resource Strain: Not on file  Food Insecurity: Food Insecurity Present (08/18/2022)   Hunger Vital Sign    Worried About Running Out of Food in the Last Year: Sometimes true    Ran Out of Food in the Last  Year: Sometimes true  Transportation Needs: Unmet Transportation Needs (08/18/2022)   PRAPARE - Hydrologist (Medical): Yes    Lack of Transportation (Non-Medical): Yes  Physical Activity: Not on file  Stress: Not on file  Social Connections: Not on file  Intimate Partner Violence: Not At Risk (08/18/2022)   Humiliation, Afraid, Rape, and Kick questionnaire    Fear of Current or Ex-Partner: No    Emotionally Abused: No    Physically Abused: No    Sexually Abused: No    FAMILY HISTORY: Family History  Problem Relation Age of Onset   Alcohol abuse Father    Cancer Father    Breast cancer Neg Hx      ALLERGIES:  is allergic to ciprofloxacin, citalopram, lamotrigine, sulfa antibiotics, and tramadol.  MEDICATIONS:  Current Facility-Administered Medications  Medication Dose Route Frequency Provider Last Rate Last Admin   acetaminophen (TYLENOL) tablet 650 mg  650 mg Oral Q6H PRN Irene Pap N, DO   650 mg at 08/18/22 7096   cefTRIAXone (ROCEPHIN) 2 g in sodium chloride 0.9 % 100 mL IVPB  2 g Intravenous Q24H Hall, Carole N, DO       cyanocobalamin (VITAMIN B12) injection 1,000 mcg  1,000 mcg Intramuscular Q24H Barb Merino, MD   1,000 mcg at 08/18/22 1136   [START ON 08/21/2022] cyanocobalamin (VITAMIN B12) tablet 1,000 mcg  1,000 mcg Oral Daily Barb Merino, MD       enoxaparin (LOVENOX) injection 40 mg  40 mg Subcutaneous Q24H Hall, Carole N, DO   40 mg at 08/17/22 2219   estradiol (ESTRACE) vaginal cream 1 Applicatorful  1 Applicatorful Vaginal QHS Alvy Bimler, , MD       folic acid (FOLVITE) tablet 1 mg  1 mg Oral Daily Alvy Bimler, , MD       gabapentin (NEURONTIN) capsule 400 mg  400 mg Oral TID Irene Pap N, DO   400 mg at 08/18/22 1136   lactated ringers infusion   Intravenous Continuous Kayleen Memos, DO 50 mL/hr at 08/17/22 2223 New Bag at 08/17/22 2223   melatonin tablet 5 mg  5 mg Oral QHS PRN Irene Pap N, DO       oxyCODONE (Oxy IR/ROXICODONE) immediate release tablet 5 mg  5 mg Oral Q6H PRN Irene Pap N, DO   5 mg at 08/18/22 2836   polyethylene glycol (MIRALAX / GLYCOLAX) packet 17 g  17 g Oral Daily PRN Irene Pap N, DO       prochlorperazine (COMPAZINE) injection 5 mg  5 mg Intravenous Q6H PRN Kayleen Memos, DO        REVIEW OF SYSTEMS:   Constitutional: Denies fevers, chills or abnormal night sweats Eyes: Denies blurriness of vision, double vision or watery eyes Ears, nose, mouth, throat, and face: Denies mucositis or sore throat Respiratory: Denies cough, dyspnea or wheezes Cardiovascular: Denies palpitation, chest discomfort or lower extremity  swelling Gastrointestinal:  Denies nausea, heartburn or change in bowel habits Skin: Denies abnormal skin rashes Lymphatics: Denies new lymphadenopathy or easy bruising Neurological:Denies numbness, tingling or new weaknesses Behavioral/Psych: Mood is stable, no new changes  All other systems were reviewed with the patient and are negative.  PHYSICAL EXAMINATION: ECOG PERFORMANCE STATUS: 1 - Symptomatic but completely ambulatory  Vitals:   08/18/22 1144 08/18/22 1200  BP: (!) 105/59 (!) 103/59  Pulse: 99 95  Resp: 20 18  Temp: 98.5 F (36.9 C) 98.4 F (36.9 C)  SpO2: 98% 99%  Filed Weights   08/17/22 1742  Weight: 230 lb (104.3 kg)    GENERAL:alert, no distress and comfortable.  She looks very pale  SKIN: skin color, texture, turgor are normal, no rashes or significant lesions EYES: normal, conjunctiva are pink and non-injected, sclera clear OROPHARYNX:no exudate, no erythema and lips, buccal mucosa, and tongue normal  NECK: supple, thyroid normal size, non-tender, without nodularity LYMPH:  no palpable lymphadenopathy in the cervical, axillary or inguinal LUNGS: clear to auscultation and percussion with normal breathing effort HEART: regular rate & rhythm and no murmurs and no lower extremity edema ABDOMEN:abdomen soft, non-tender and normal bowel sounds Musculoskeletal:no cyanosis of digits and no clubbing  PSYCH: alert & oriented x 3 with fluent speech NEURO: no focal motor/sensory deficits  RADIOGRAPHIC STUDIES: I have personally reviewed the radiological images as listed and agreed with the findings in the report. CT ABDOMEN PELVIS W CONTRAST  Result Date: 08/17/2022 CLINICAL DATA:  Acute abdominal pain. History of uterine/cervical cancer. EXAM: CT ABDOMEN AND PELVIS WITH CONTRAST TECHNIQUE: Multidetector CT imaging of the abdomen and pelvis was performed using the standard protocol following bolus administration of intravenous contrast. RADIATION DOSE REDUCTION:  This exam was performed according to the departmental dose-optimization program which includes automated exposure control, adjustment of the mA and/or kV according to patient size and/or use of iterative reconstruction technique. CONTRAST:  161m OMNIPAQUE IOHEXOL 300 MG/ML  SOLN COMPARISON:  CT abdomen and pelvis 05/24/2021 and 06/18/2019. FINDINGS: Lower chest: Stable 6 mm pulmonary nodule in the right lower lobe compatible with benign etiology. Hepatobiliary: No focal liver abnormality is seen. Status post cholecystectomy. No biliary dilatation. There is a calcified granuloma in the dome of the liver, unchanged. Pancreas: Unremarkable. No pancreatic ductal dilatation or surrounding inflammatory changes. Spleen: Mildly enlarged, unchanged. Adrenals/Urinary Tract: Bilateral renal cysts are present. The largest is in the superior pole the right kidney measuring 2.7 cm. Otherwise, the kidneys and adrenal glands are within normal limits. There is mild bladder wall thickening. Stomach/Bowel: Stomach is within normal limits. Appendix appears normal. No evidence of bowel wall thickening, distention, or inflammatory changes. There is a large amount of stool throughout the colon. Vascular/Lymphatic: No significant vascular findings are present. No enlarged abdominal or pelvic lymph nodes. Reproductive: Status post hysterectomy. No adnexal masses. Other: No abdominal wall hernia or abnormality. No abdominopelvic ascites. Musculoskeletal: Chronic compression deformity of the superior endplate of L2 is unchanged. No acute fractures are seen. IMPRESSION: 1. Mild bladder wall thickening worrisome for cystitis. 2. No other acute localizing process in the abdomen or pelvis. 3. Stable mild splenomegaly. 4. Right Bosniak I benign renal cyst measuring 2.7 cm. No follow-up imaging is recommended. JACR 2018 Feb; 264-273, Management of the Incidental Renal Mass on CT, [employer] 2021; 814-848, Bosniak Classification of Cystic Renal  Masses, Version 2019. Electronically Signed   By: ARonney AstersM.D.   On: 08/17/2022 19:16   CT HEAD WO CONTRAST (5MM)  Result Date: 08/17/2022 CLINICAL DATA:  Dizziness, altered mental status. EXAM: CT HEAD WITHOUT CONTRAST TECHNIQUE: Contiguous axial images were obtained from the base of the skull through the vertex without intravenous contrast. RADIATION DOSE REDUCTION: This exam was performed according to the departmental dose-optimization program which includes automated exposure control, adjustment of the mA and/or kV according to patient size and/or use of iterative reconstruction technique. COMPARISON:  CT head dated 08/15/2015. FINDINGS: Motion artifact limits the sensitivity of the exam, particularly inferiorly. Brain: No evidence of acute infarction, hemorrhage, hydrocephalus, extra-axial collection or mass  lesion/mass effect. Vascular: There are vascular calcifications in the carotid siphons. Skull: Normal. Negative for fracture or focal lesion. Sinuses/Orbits: No acute finding. Other: None. IMPRESSION: 1. No acute intracranial process, however motion artifact limits evaluation of the inferior portion of the head. Electronically Signed   By: Zerita Boers M.D.   On: 08/17/2022 19:12   DG Chest Portable 1 View  Result Date: 08/17/2022 CLINICAL DATA:  Possible sepsis EXAM: PORTABLE CHEST 1 VIEW COMPARISON:  05/04/2019 FINDINGS: The heart size and mediastinal contours are within normal limits. Both lungs are clear. The visualized skeletal structures are unremarkable. IMPRESSION: No active disease. Electronically Signed   By: Elmer Picker M.D.   On: 08/17/2022 16:56

## 2022-08-19 ENCOUNTER — Other Ambulatory Visit: Payer: Self-pay | Admitting: Obstetrics and Gynecology

## 2022-08-19 ENCOUNTER — Encounter: Payer: Self-pay | Admitting: General Practice

## 2022-08-19 ENCOUNTER — Other Ambulatory Visit: Payer: Self-pay | Admitting: Hematology and Oncology

## 2022-08-19 DIAGNOSIS — N390 Urinary tract infection, site not specified: Secondary | ICD-10-CM

## 2022-08-19 DIAGNOSIS — E538 Deficiency of other specified B group vitamins: Secondary | ICD-10-CM | POA: Insufficient documentation

## 2022-08-19 LAB — CBC WITH DIFFERENTIAL/PLATELET
Abs Immature Granulocytes: 0 10*3/uL (ref 0.00–0.07)
Basophils Absolute: 0 10*3/uL (ref 0.0–0.1)
Basophils Relative: 0 %
Eosinophils Absolute: 0 10*3/uL (ref 0.0–0.5)
Eosinophils Relative: 0 %
HCT: 22.5 % — ABNORMAL LOW (ref 36.0–46.0)
Hemoglobin: 8.1 g/dL — ABNORMAL LOW (ref 12.0–15.0)
Immature Granulocytes: 0 %
Lymphocytes Relative: 41 %
Lymphs Abs: 0.8 10*3/uL (ref 0.7–4.0)
MCH: 40.1 pg — ABNORMAL HIGH (ref 26.0–34.0)
MCHC: 36 g/dL (ref 30.0–36.0)
MCV: 111.4 fL — ABNORMAL HIGH (ref 80.0–100.0)
Monocytes Absolute: 0.1 10*3/uL (ref 0.1–1.0)
Monocytes Relative: 7 %
Neutro Abs: 1 10*3/uL — ABNORMAL LOW (ref 1.7–7.7)
Neutrophils Relative %: 52 %
Platelets: 79 10*3/uL — ABNORMAL LOW (ref 150–400)
RBC: 2.02 MIL/uL — ABNORMAL LOW (ref 3.87–5.11)
WBC: 1.9 10*3/uL — ABNORMAL LOW (ref 4.0–10.5)
nRBC: 0 % (ref 0.0–0.2)

## 2022-08-19 LAB — TYPE AND SCREEN
ABO/RH(D): A NEG
Antibody Screen: NEGATIVE
Unit division: 0

## 2022-08-19 LAB — BPAM RBC
Blood Product Expiration Date: 202311222359
ISSUE DATE / TIME: 202310291154
Unit Type and Rh: 600

## 2022-08-19 LAB — PHOSPHORUS: Phosphorus: 4 mg/dL (ref 2.5–4.6)

## 2022-08-19 LAB — COMPREHENSIVE METABOLIC PANEL
ALT: 15 U/L (ref 0–44)
AST: 21 U/L (ref 15–41)
Albumin: 3 g/dL — ABNORMAL LOW (ref 3.5–5.0)
Alkaline Phosphatase: 76 U/L (ref 38–126)
Anion gap: 4 — ABNORMAL LOW (ref 5–15)
BUN: 10 mg/dL (ref 6–20)
CO2: 25 mmol/L (ref 22–32)
Calcium: 7.9 mg/dL — ABNORMAL LOW (ref 8.9–10.3)
Chloride: 108 mmol/L (ref 98–111)
Creatinine, Ser: 0.55 mg/dL (ref 0.44–1.00)
GFR, Estimated: >60 mL/min (ref >60)
Glucose, Bld: 101 mg/dL — ABNORMAL HIGH (ref 70–99)
Potassium: 4.2 mmol/L (ref 3.5–5.1)
Sodium: 137 mmol/L (ref 135–145)
Total Bilirubin: 0.7 mg/dL (ref 0.3–1.2)
Total Protein: 5.2 g/dL — ABNORMAL LOW (ref 6.5–8.1)

## 2022-08-19 LAB — MAGNESIUM: Magnesium: 2.1 mg/dL (ref 1.7–2.4)

## 2022-08-19 MED ORDER — MICONAZOLE NITRATE 2 % EX CREA
TOPICAL_CREAM | Freq: Two times a day (BID) | CUTANEOUS | Status: DC
  Filled 2022-08-19: qty 28

## 2022-08-19 MED ORDER — GABAPENTIN 400 MG PO CAPS
800.0000 mg | ORAL_CAPSULE | Freq: Every day | ORAL | Status: DC
  Administered 2022-08-19 – 2022-08-21 (×3): 800 mg via ORAL
  Filled 2022-08-19 (×3): qty 2

## 2022-08-19 NOTE — Progress Notes (Signed)
Sent a transportation referral to assist with appointments.

## 2022-08-19 NOTE — TOC Initial Note (Signed)
Transition of Care [facility]) - Initial/Assessment Note    Patient Details  Name: Markayla Reichart MRN: [id number] Date of Birth: Jan 29, 1966  [facility] CM/SW Contact:    Vassie Moselle, LCSW Phone Number: 08/19/2022, 11:28 AM  Clinical Narrative:                 Met with pt as she screened for food and transport insecurities. Pt shares she has access to transportation through Medicaid, [employer], and the [place] and denies any transportation needs/concerns.   Pt also denies having any food insecurity concerns. She states she has SNAP benefits and is aware of additional resources for food.   No further TOC needs identified at this time. Please consult TOC should needs arise.   Expected Discharge Plan: Home/Self Care Barriers to Discharge: No Barriers Identified   Patient Goals and CMS Choice Patient states their goals for this hospitalization and ongoing recovery are:: To go home CMS Medicare.gov Compare Post Acute Care list provided to:: Patient Choice offered to / list presented to : Patient  Expected Discharge Plan and Services Expected Discharge Plan: Home/Self Care In-house Referral: NA Discharge Planning Services: NA Post Acute Care Choice: NA Living arrangements for the past 2 months: Single Family Home                 DME Arranged: N/A DME Agency: NA                  Prior Living Arrangements/Services Living arrangements for the past 2 months: Single Family Home Lives with:: Significant Other Patient language and need for interpreter reviewed:: Yes Do you feel safe going back to the place where you live?: Yes      Need for Family Participation in Patient Care: No (Comment) Care giver support system in place?: No (comment)   Criminal Activity/Legal Involvement Pertinent to Current Situation/Hospitalization: No - Comment as needed  Activities of Daily Living Home Assistive Devices/Equipment: None ADL Screening (condition at time of  admission) Patient's cognitive ability adequate to safely complete daily activities?: Yes Is the patient deaf or have difficulty hearing?: No Does the patient have difficulty seeing, even when wearing glasses/contacts?: No Does the patient have difficulty concentrating, remembering, or making decisions?: No Patient able to express need for assistance with ADLs?: Yes Does the patient have difficulty dressing or bathing?: No Independently performs ADLs?: Yes (appropriate for developmental age) Does the patient have difficulty walking or climbing stairs?: No Weakness of Legs: None Weakness of Arms/Hands: None  Permission Sought/Granted   Permission granted to share information with : No              Emotional Assessment Appearance:: Appears stated age Attitude/Demeanor/Rapport: Engaged, Paranoid Affect (typically observed): Defensive Orientation: : Oriented to Self, Oriented to Place, Oriented to  Time, Oriented to Situation Alcohol / Substance Use: Not Applicable Psych Involvement: No (comment)  Admission diagnosis:  Lower urinary tract infectious disease [V95.6] Complicated UTI (urinary tract infection) [N39.0] Pancytopenia ([place]) [D61.818] Sepsis, due to unspecified organism, unspecified whether acute organ dysfunction present [facility]) [A41.9] Patient Active Problem List   Diagnosis Date Noted   Vitamin B12 deficiency 38/75/6433   Complicated UTI (urinary tract infection) 08/17/2022   Grief 04/17/2022   Menopausal symptoms 05/02/2021   Leukopenia    Vitamin D deficiency    Acute lower UTI    Vaginal cancer ([place]) 07/22/2019   Morbid obesity with BMI of 40.0-44.9, adult ([place]) 06/11/2019   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  [place]) 05/28/2019   Visit for routine gyn exam 04/21/2019   Vaginal atrophy 54/27/0623   Diastolic dysfunction 76/28/3151   Constipation 05/12/2015   ESBL (extended spectrum beta-lactamase) producing bacteria infection 03/17/2015   Megaloblastic anemia  02/22/2015   Anxiety state 02/20/2015   Claustrophobia 02/20/2015   Transaminitis 02/18/2015   Chest pain 02/18/2015   Pancytopenia ([place])    Abdominal pain    Dizziness    Hypotension 01/04/2015   Bipolar disorder ([place]) 06/29/2012   PCP:  [name], NP Pharmacy:   [facility], [address] [place] [address] Phone: [phone] Fax: [phone]     Social Determinants of Health (SDOH) Interventions Food Insecurity Interventions: Intervention Not Indicated Transportation Interventions: Intervention Not Indicated  Readmission Risk Interventions    08/19/2022   11:26 AM 04/11/2020   11:50 AM  Readmission Risk Prevention Plan  Post Dischage Appt Complete Complete  Medication Screening Complete Complete  Transportation Screening Complete Complete

## 2022-08-19 NOTE — Plan of Care (Signed)

## 2022-08-19 NOTE — Progress Notes (Signed)
Patient educated and demonstrated how to give cyancocobalamin (vitamin B12) intramuscular injection. Patient successfully gave herself the injection with this RN verbal instructions given. Pt verbalized understanding.

## 2022-08-19 NOTE — Progress Notes (Addendum)
This nurse and the charge nurse politely asked this patient to search her hand-bag after the patient appeared to be drowsy than normal. The patient agreed to have her hand-bag searched and two pill bottles were found in the patient's bag. The patient was educated on the hospital's policy regarded home medications, which the patient stated her understanding. The medication were counted by this nurse and the charge nurse and taken to the pharmacy. The [facility] and J. Olena Heckle, NP were made aware of the situation. Will continue to monitor the patient.

## 2022-08-19 NOTE — Progress Notes (Signed)
Mobility Specialist - Progress Note   08/19/22 1115  Mobility  Activity Ambulated with assistance in hallway  Level of Assistance Contact guard assist, steadying assist  Assistive Device Front wheel walker  Distance Ambulated (ft) 150 ft  Activity Response Tolerated well  Mobility Referral Yes  $Mobility charge 1 Mobility   Pt received in bed and agreed to mobility, no c/o pain but lots of dizziness throughout session. No LOB or observable signs of dizziness. Pt back to bed with all needs met and bed alarm on.   Roderick Pee Mobility Specialist

## 2022-08-19 NOTE — Progress Notes (Signed)
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  Poor historian.  Hemodynamically stable in the ER.  Found to have pancytopenia and anemia as below.   Assessment & Plan:   Sepsis secondary to complicated UTI with history of radiation cystitis present on admission: Presented with symptomatology of UTI, leukopenia. Blood cultures and urine cultures negative so far. Currently remains on Rocephin that we will continue.  Continue maintenance IV fluids and supportive therapies. She gets recurrent UTIs due to history of radiation cystitis and also being menopausal now.  She may benefit with long-term maintenance antibiotic therapy.  We will follow-up cultures. Started on estrogen vaginal suppository.  Pancytopenia, severe.  Severe B12 deficiency. B12 levels 50, start oral and intramuscular replacement, will suggest aggressive replacement with intramuscular B12 while in the hospital and will need weekly injections. Hemoglobin 6.4 -1 unit of PRBC transfusion with adequate response.  Iron levels are adequate.  MCV 120.    Mobilize with PT OT and to assess safe disposition home.      DVT prophylaxis: enoxaparin (LOVENOX) injection 40 mg Start: 08/17/22 2200   Code Status: Full code Family Communication: None at the bedside Disposition Plan: Status is: Inpatient Remains inpatient appropriate because: Blood transfusion, antibiotics     Consultants:  Hematology oncology  Procedures:  None  Antimicrobials:  Rocephin 2 g IV daily 10/28---   Subjective: Patient seen and examined.  Denied any complaints to me.  She has poor memory and recall.  She keeps  saying same thing again and again.  Objective: Vitals:   08/18/22 1200 08/18/22 1450 08/18/22 1943 08/19/22 0414  BP: (!) 103/59 (!) 105/57 (!) 115/57 (!) 116/59  Pulse: 95 96 (!) 101 94  Resp: '18  20 20  '$ Temp: 98.4 F (36.9 C) 99 F (37.2 C) 100 F (37.8 C) 97.8 F (36.6 C)  TempSrc: Oral Oral Oral Oral  SpO2: 99% 99% 98% 96%  Weight:      Height:        Intake/Output Summary (Last 24 hours) at 08/19/2022 1155 Last data filed at 08/19/2022 0800 Gross per 24 hour  Intake 1018.17 ml  Output 1800 ml  Net -781.83 ml    Filed Weights   08/17/22 1742  Weight: 104.3 kg    Examination:  General exam: Appears calm and comfortable, anxious and impulsive. Respiratory system: Clear to auscultation. Respiratory effort normal.  No added sounds. Cardiovascular system: S1 & S2 heard, RRR. No pedal edema. Gastrointestinal system: Soft.  Nontender.  Nondistended.  Bowel sound present. Central nervous system: Alert and oriented. No focal neurological deficits. Extremities: Symmetric 5 x 5 power. Skin: No rashes, lesions or ulcers Psychiatry: Judgement and insight appear normal.  Anxious mood and flat affect.    Data Reviewed: I have personally reviewed following labs and imaging studies  CBC: Recent Labs  Lab 08/17/22 1719 08/18/22 0523 08/19/22 0522  WBC 1.3* 1.6* 1.9*  NEUTROABS 1.1* 0.8* 1.0*  HGB 7.6* 6.4* 8.1*  HCT 21.6* 18.2* 22.5*  MCV 122.0* 127.3* 111.4*  PLT 71* 62* 79*    Basic Metabolic Panel: Recent Labs  Lab 08/17/22 1719 08/18/22 0523 08/19/22 0522  NA 139 136 137  K 3.5 4.5 4.2  CL 109 107 108  CO2 '24 27 25  '$ GLUCOSE 134* 107* 101*  BUN '11 11 10  '$ CREATININE 0.66 0.51 0.55  CALCIUM 8.2* 8.1* 7.9*  MG  --  2.1 2.1  PHOS  --  3.4 4.0    GFR: Estimated Creatinine Clearance: 101.7 mL/min (by C-G formula based on SCr of 0.55 mg/dL). Liver Function Tests: Recent Labs  Lab 08/18/22 0523 08/19/22 0522  AST 16 21  ALT 13 15  ALKPHOS 83 76   BILITOT 1.1 0.7  PROT 5.1* 5.2*  ALBUMIN 3.0* 3.0*    No results for input(s): "LIPASE", "AMYLASE" in the last 168 hours. No results for input(s): "AMMONIA" in the last 168 hours. Coagulation Profile: No results for input(s): "INR", "PROTIME" in the last 168 hours. Cardiac Enzymes: No results for input(s): "CKTOTAL", "CKMB", "CKMBINDEX", "TROPONINI" in the last 168 hours. BNP (last 3 results) No results for input(s): "PROBNP" in the last 8760 hours. HbA1C: No results for input(s): "HGBA1C" in the last 72 hours. CBG: No results for input(s): "GLUCAP" in the last 168 hours. Lipid Profile: No results for input(s): "CHOL", "HDL", "LDLCALC", "TRIG", "CHOLHDL", "LDLDIRECT" in the last 72 hours. Thyroid Function Tests: No results for input(s): "TSH", "T4TOTAL", "FREET4", "T3FREE", "THYROIDAB" in the last 72 hours. Anemia Panel: Recent Labs    08/17/22 2032  VITAMINB12 <50*    Sepsis Labs: Recent Labs  Lab 08/17/22 1718 08/17/22 2032  LATICACIDVEN 2.4* 1.0     Recent Results (from the past 240 hour(s))  Blood culture (routine x 2)     Status: None (Preliminary result)   Collection Time: 08/17/22  4:45 PM   Specimen: BLOOD  Result Value Ref Range Status   Specimen Description   Final    BLOOD RIGHT ANTECUBITAL Performed at [facility], [place] [address] [place], [place] [address]    Special Requests   Final    BOTTLES DRAWN AEROBIC AND ANAEROBIC Blood Culture results may not be optimal due to an excessive volume of blood received in culture bottles Performed at [place] [address], [place], [place] [address]    Culture   Final    NO GROWTH 2 DAYS Performed at [facility] [address], [place], [place] [address]    Report Status PENDING  Incomplete  Blood culture (routine x 2)     Status: None (Preliminary result)   Collection Time: 08/17/22  5:00 PM   Specimen: BLOOD  Result Value Ref Range Status   Specimen  Description BLOOD SITE NOT SPECIFIED  Final   Special Requests   Final    BOTTLES DRAWN AEROBIC AND ANAEROBIC Blood Culture results may not be optimal due to an excessive volume of blood received in culture bottles   Culture   Final    NO GROWTH 2 DAYS Performed at [facility], [place] [address], [place], [place] [address]    Report Status PENDING  Incomplete  Resp Panel by RT-PCR (Flu A&B, Covid) Anterior Nasal Swab     Status: None   Collection Time: 08/17/22  8:34 PM   Specimen: Anterior Nasal Swab  Result Value Ref Range Status   SARS Coronavirus 2 by RT PCR NEGATIVE NEGATIVE Final    Comment: (NOTE) SARS-CoV-2 target nucleic acids are NOT DETECTED.  The SARS-CoV-2 RNA is generally detectable in upper respiratory specimens during the acute phase of infection. The lowest concentration of SARS-CoV-2 viral copies this assay can detect is 138 copies/mL.  A negative result does not preclude SARS-Cov-2 infection and should not be used as the sole basis for treatment or other patient management decisions. A negative result may occur with  improper specimen collection/handling, submission of specimen other than nasopharyngeal swab, presence of viral mutation(s) within the areas targeted by this assay, and inadequate number of viral copies(<138 copies/mL). A negative result must be combined with clinical observations, patient history, and epidemiological information. The expected result is Negative.  Fact Sheet for Patients:  EntrepreneurPulse.com.au  Fact Sheet for Healthcare Providers:  IncredibleEmployment.be  This test is no t yet approved or cleared by the [place] FDA and  has been authorized for detection and/or diagnosis of SARS-CoV-2 by FDA under an Emergency Use Authorization (EUA). This EUA will remain  in effect (meaning this test can be used) for the duration of the COVID-19 declaration under Section 564(b)(1) of the Act,  21 U.S.C.section 360bbb-3(b)(1), unless the authorization is terminated  or revoked sooner.       Influenza A by PCR NEGATIVE NEGATIVE Final   Influenza B by PCR NEGATIVE NEGATIVE Final    Comment: (NOTE) The Xpert Xpress SARS-CoV-2/FLU/RSV plus assay is intended as an aid in the diagnosis of influenza from Nasopharyngeal swab specimens and should not be used as a sole basis for treatment. Nasal washings and aspirates are unacceptable for Xpert Xpress SARS-CoV-2/FLU/RSV testing.  Fact Sheet for Patients: EntrepreneurPulse.com.au  Fact Sheet for Healthcare Providers: IncredibleEmployment.be  This test is not yet approved or cleared by the [place] FDA and has been authorized for detection and/or diagnosis of SARS-CoV-2 by FDA under an Emergency Use Authorization (EUA). This EUA will remain in effect (meaning this test can be used) for the duration of the COVID-19 declaration under Section 564(b)(1) of the Act, 21 U.S.C. section 360bbb-3(b)(1), unless the authorization is terminated or revoked.  Performed at [facility], [place] [address], [place], [place] [address]          Radiology Studies: CT ABDOMEN PELVIS W CONTRAST  Result Date: 08/17/2022 CLINICAL DATA:  Acute abdominal pain. History of uterine/cervical cancer. EXAM: CT ABDOMEN AND PELVIS WITH CONTRAST TECHNIQUE: Multidetector CT imaging of the abdomen and pelvis was performed using the standard protocol following bolus administration of intravenous contrast. RADIATION DOSE REDUCTION: This exam was performed according to the departmental dose-optimization program which includes automated exposure control, adjustment of the mA and/or kV according to patient size and/or use of iterative reconstruction technique. CONTRAST:  154m OMNIPAQUE IOHEXOL 300 MG/ML  SOLN COMPARISON:  CT abdomen and pelvis 05/24/2021 and 06/18/2019. FINDINGS: Lower chest: Stable 6 mm  pulmonary nodule in the right lower lobe compatible with benign etiology. Hepatobiliary: No focal liver abnormality is seen. Status post cholecystectomy. No biliary dilatation. There is a calcified granuloma in the dome of the liver, unchanged. Pancreas: Unremarkable. No pancreatic ductal dilatation or surrounding inflammatory changes. Spleen: Mildly enlarged, unchanged. Adrenals/Urinary Tract: Bilateral renal cysts are present. The largest is in the superior pole the right kidney measuring 2.7 cm. Otherwise, the kidneys and adrenal glands are within normal limits. There is mild bladder wall thickening. Stomach/Bowel: Stomach is within normal limits. Appendix appears normal. No evidence of bowel wall thickening, distention, or inflammatory changes. There is a large amount of stool throughout the colon. Vascular/Lymphatic: No significant vascular findings are present. No enlarged abdominal or pelvic lymph nodes. Reproductive: Status post hysterectomy. No adnexal masses. Other: No abdominal wall hernia or abnormality. No abdominopelvic ascites. Musculoskeletal: Chronic compression deformity of the superior  endplate of L2 is unchanged. No acute fractures are seen. IMPRESSION: 1. Mild bladder wall thickening worrisome for cystitis. 2. No other acute localizing process in the abdomen or pelvis. 3. Stable mild splenomegaly. 4. Right Bosniak I benign renal cyst measuring 2.7 cm. No follow-up imaging is recommended. JACR 2018 Feb; 264-273, Management of the Incidental Renal Mass on CT, [employer] 2021; 814-848, Bosniak Classification of Cystic Renal Masses, Version 2019. Electronically Signed   By: Ronney Asters M.D.   On: 08/17/2022 19:16   CT HEAD WO CONTRAST (5MM)  Result Date: 08/17/2022 CLINICAL DATA:  Dizziness, altered mental status. EXAM: CT HEAD WITHOUT CONTRAST TECHNIQUE: Contiguous axial images were obtained from the base of the skull through the vertex without intravenous contrast. RADIATION DOSE  REDUCTION: This exam was performed according to the departmental dose-optimization program which includes automated exposure control, adjustment of the mA and/or kV according to patient size and/or use of iterative reconstruction technique. COMPARISON:  CT head dated 08/15/2015. FINDINGS: Motion artifact limits the sensitivity of the exam, particularly inferiorly. Brain: No evidence of acute infarction, hemorrhage, hydrocephalus, extra-axial collection or mass lesion/mass effect. Vascular: There are vascular calcifications in the carotid siphons. Skull: Normal. Negative for fracture or focal lesion. Sinuses/Orbits: No acute finding. Other: None. IMPRESSION: 1. No acute intracranial process, however motion artifact limits evaluation of the inferior portion of the head. Electronically Signed   By: Zerita Boers M.D.   On: 08/17/2022 19:12   DG Chest Portable 1 View  Result Date: 08/17/2022 CLINICAL DATA:  Possible sepsis EXAM: PORTABLE CHEST 1 VIEW COMPARISON:  05/04/2019 FINDINGS: The heart size and mediastinal contours are within normal limits. Both lungs are clear. The visualized skeletal structures are unremarkable. IMPRESSION: No active disease. Electronically Signed   By: Elmer Picker M.D.   On: 08/17/2022 16:56        Scheduled Meds:  cyanocobalamin  1,000 mcg Intramuscular Q24H   [START ON 08/21/2022] vitamin B-12  1,000 mcg Oral Daily   enoxaparin (LOVENOX) injection  40 mg Subcutaneous Q24H   estradiol  1 Applicatorful Vaginal QHS   folic acid  1 mg Oral Daily   gabapentin  400 mg Oral TID   miconazole   Topical BID   Continuous Infusions:  cefTRIAXone (ROCEPHIN)  IV 2 g (08/18/22 1705)   lactated ringers 50 mL/hr at 08/19/22 1025     LOS: 2 days    Time spent: 35 minutes    Barb Merino, MD [facility] Pager [phone]

## 2022-08-19 NOTE — Progress Notes (Signed)
Lynna Zamorano   DOB:1966/06/24   LO#:[id number]    ASSESSMENT & PLAN:   Severe pancytopenia, likely secondary to severe vitamin B12 deficiency She had remote history of pancytopenia in 2016 that has subsequently resolved Repeat blood work shows severe vitamin B12 deficiency I recommend blood transfusion support to keep hemoglobin greater than 8 and to institute daily vitamin B12 injection I will add folic acid for effective erythropoiesis If blood count remains stable tomorrow, she can be discharged with outpatient follow-up   Significant dysuria, suspect vaginal atrophy/vaginitis Recommend trial of daily topical Estrace for a month before transitioning her to 3 times a week dose   Recent neutropenic fever Cultures are so far negative I recommend continue on IV fluids today due to dizziness and hopefully discharge tomorrow with oral antibiotic therapy and follow cultures for 1 more day  Poor memory It appears that the patient has very poor memory recall I wonder if severe B12 deficiency is the cause of this  History of vaginal cancer No evidence of disease on recent exam She will follow-up with radiation oncologist and GYN   Discharge planning I am hopeful she can be discharged tomorrow I will try to arrange for outpatient follow-up   All questions were answered. The patient knows to call the clinic with any problems, questions or concerns.   The total time spent in the appointment was 40 minutes encounter with patients including review of chart and various tests results, discussions about plan of care and coordination of care plan  Heath Lark, MD 08/19/2022 9:06 AM  Subjective:  She felt dizzy today.  She has been afebrile since admission.  Her energy level has improved since blood transfusion.  The patient asked questions several times repeatedly, noted poor memory recall She is concerned about rash on her right groin  Objective:  Vitals:   08/18/22 1943 08/19/22 0414  BP:  (!) 115/57 (!) 116/59  Pulse: (!) 101 94  Resp: 20 20  Temp: 100 F (37.8 C) 97.8 F (36.6 C)  SpO2: 98% 96%     Intake/Output Summary (Last 24 hours) at 08/19/2022 1884 Last data filed at 08/19/2022 0800 Gross per 24 hour  Intake 1018.17 ml  Output 1800 ml  Net -781.83 ml    GENERAL:alert, no distress and comfortable Rash on her right groin is consistent with yeast infection NEURO: alert & oriented x 3 with fluent speech, no focal motor/sensory deficits.  Noted significant memory impairment with multiple recall   Labs:  Recent Labs    08/17/22 1719 08/18/22 0523 08/19/22 0522  NA 139 136 137  K 3.5 4.5 4.2  CL 109 107 108  CO2 '24 27 25  '$ GLUCOSE 134* 107* 101*  BUN '11 11 10  '$ CREATININE 0.66 0.51 0.55  CALCIUM 8.2* 8.1* 7.9*  GFRNONAA >60 >60 >60  PROT  --  5.1* 5.2*  ALBUMIN  --  3.0* 3.0*  AST  --  16 21  ALT  --  13 15  ALKPHOS  --  83 76  BILITOT  --  1.1 0.7    Studies:  CT ABDOMEN PELVIS W CONTRAST  Result Date: 08/17/2022 CLINICAL DATA:  Acute abdominal pain. History of uterine/cervical cancer. EXAM: CT ABDOMEN AND PELVIS WITH CONTRAST TECHNIQUE: Multidetector CT imaging of the abdomen and pelvis was performed using the standard protocol following bolus administration of intravenous contrast. RADIATION DOSE REDUCTION: This exam was performed according to the departmental dose-optimization program which includes automated exposure control, adjustment of the mA  and/or kV according to patient size and/or use of iterative reconstruction technique. CONTRAST:  16m OMNIPAQUE IOHEXOL 300 MG/ML  SOLN COMPARISON:  CT abdomen and pelvis 05/24/2021 and 06/18/2019. FINDINGS: Lower chest: Stable 6 mm pulmonary nodule in the right lower lobe compatible with benign etiology. Hepatobiliary: No focal liver abnormality is seen. Status post cholecystectomy. No biliary dilatation. There is a calcified granuloma in the dome of the liver, unchanged. Pancreas: Unremarkable. No  pancreatic ductal dilatation or surrounding inflammatory changes. Spleen: Mildly enlarged, unchanged. Adrenals/Urinary Tract: Bilateral renal cysts are present. The largest is in the superior pole the right kidney measuring 2.7 cm. Otherwise, the kidneys and adrenal glands are within normal limits. There is mild bladder wall thickening. Stomach/Bowel: Stomach is within normal limits. Appendix appears normal. No evidence of bowel wall thickening, distention, or inflammatory changes. There is a large amount of stool throughout the colon. Vascular/Lymphatic: No significant vascular findings are present. No enlarged abdominal or pelvic lymph nodes. Reproductive: Status post hysterectomy. No adnexal masses. Other: No abdominal wall hernia or abnormality. No abdominopelvic ascites. Musculoskeletal: Chronic compression deformity of the superior endplate of L2 is unchanged. No acute fractures are seen. IMPRESSION: 1. Mild bladder wall thickening worrisome for cystitis. 2. No other acute localizing process in the abdomen or pelvis. 3. Stable mild splenomegaly. 4. Right Bosniak I benign renal cyst measuring 2.7 cm. No follow-up imaging is recommended. JACR 2018 Feb; 264-273, Management of the Incidental Renal Mass on CT, [employer] 2021; 814-848, Bosniak Classification of Cystic Renal Masses, Version 2019. Electronically Signed   By: ARonney AstersM.D.   On: 08/17/2022 19:16   CT HEAD WO CONTRAST (5MM)  Result Date: 08/17/2022 CLINICAL DATA:  Dizziness, altered mental status. EXAM: CT HEAD WITHOUT CONTRAST TECHNIQUE: Contiguous axial images were obtained from the base of the skull through the vertex without intravenous contrast. RADIATION DOSE REDUCTION: This exam was performed according to the departmental dose-optimization program which includes automated exposure control, adjustment of the mA and/or kV according to patient size and/or use of iterative reconstruction technique. COMPARISON:  CT head dated 08/15/2015.  FINDINGS: Motion artifact limits the sensitivity of the exam, particularly inferiorly. Brain: No evidence of acute infarction, hemorrhage, hydrocephalus, extra-axial collection or mass lesion/mass effect. Vascular: There are vascular calcifications in the carotid siphons. Skull: Normal. Negative for fracture or focal lesion. Sinuses/Orbits: No acute finding. Other: None. IMPRESSION: 1. No acute intracranial process, however motion artifact limits evaluation of the inferior portion of the head. Electronically Signed   By: TZerita BoersM.D.   On: 08/17/2022 19:12   DG Chest Portable 1 View  Result Date: 08/17/2022 CLINICAL DATA:  Possible sepsis EXAM: PORTABLE CHEST 1 VIEW COMPARISON:  05/04/2019 FINDINGS: The heart size and mediastinal contours are within normal limits. Both lungs are clear. The visualized skeletal structures are unremarkable. IMPRESSION: No active disease. Electronically Signed   By: PElmer PickerM.D.   On: 08/17/2022 16:56

## 2022-08-19 NOTE — Progress Notes (Signed)
[place] Spiritual Care Note  Received and returned voicemail from [place]. Follow from [facility] side.   [place], [place], [facility] Pager [phone] Voicemail [phone]

## 2022-08-20 ENCOUNTER — Ambulatory Visit: Payer: Medicaid Other | Admitting: Audiology

## 2022-08-20 DIAGNOSIS — N390 Urinary tract infection, site not specified: Secondary | ICD-10-CM | POA: Diagnosis not present

## 2022-08-20 LAB — CBC WITH DIFFERENTIAL/PLATELET
Abs Immature Granulocytes: 0.03 10*3/uL (ref 0.00–0.07)
Basophils Absolute: 0 10*3/uL (ref 0.0–0.1)
Basophils Relative: 0 %
Eosinophils Absolute: 0 10*3/uL (ref 0.0–0.5)
Eosinophils Relative: 0 %
HCT: 27.3 % — ABNORMAL LOW (ref 36.0–46.0)
Hemoglobin: 9.3 g/dL — ABNORMAL LOW (ref 12.0–15.0)
Immature Granulocytes: 1 %
Lymphocytes Relative: 16 %
Lymphs Abs: 0.5 10*3/uL — ABNORMAL LOW (ref 0.7–4.0)
MCH: 39.6 pg — ABNORMAL HIGH (ref 26.0–34.0)
MCHC: 34.1 g/dL (ref 30.0–36.0)
MCV: 116.2 fL — ABNORMAL HIGH (ref 80.0–100.0)
Monocytes Absolute: 0.3 10*3/uL (ref 0.1–1.0)
Monocytes Relative: 10 %
Neutro Abs: 2.5 10*3/uL (ref 1.7–7.7)
Neutrophils Relative %: 73 %
Platelets: 104 10*3/uL — ABNORMAL LOW (ref 150–400)
RBC: 2.35 MIL/uL — ABNORMAL LOW (ref 3.87–5.11)
RDW: 23.5 % — ABNORMAL HIGH (ref 11.5–15.5)
WBC: 3.4 10*3/uL — ABNORMAL LOW (ref 4.0–10.5)
nRBC: 0 % (ref 0.0–0.2)

## 2022-08-20 LAB — URINE CULTURE: Culture: >=100000 — AB

## 2022-08-20 MED ORDER — OXYCODONE HCL 5 MG PO TABS
10.0000 mg | ORAL_TABLET | Freq: Four times a day (QID) | ORAL | Status: DC | PRN
  Administered 2022-08-20 – 2022-08-21 (×5): 10 mg via ORAL
  Filled 2022-08-20 (×5): qty 2

## 2022-08-20 MED ORDER — GUAIFENESIN-DM 100-10 MG/5ML PO SYRP
5.0000 mL | ORAL_SOLUTION | ORAL | Status: DC | PRN
  Administered 2022-08-20 – 2022-08-22 (×7): 5 mL via ORAL
  Filled 2022-08-20 (×7): qty 5

## 2022-08-20 MED ORDER — CYANOCOBALAMIN 1000 MCG/ML IJ SOLN
1000.0000 ug | INTRAMUSCULAR | Status: DC
  Administered 2022-08-21 – 2022-08-22 (×2): 1000 ug via INTRAMUSCULAR
  Filled 2022-08-20 (×2): qty 1

## 2022-08-20 NOTE — Progress Notes (Addendum)
Chaplain engaged in an initial visit with Mallerie, who has been grieving the death of her partner of 38 years.  She voiced that he passed some time ago and that she still feels very lonely.  She talked about the love they shared which for Wynona was "once in a lifetime." Her late boyfriend was a significant part of her life.   Kayah spent time talking about her independence and how valuable that has been to her.  She does not desire to ever go into a nursing home or facility because of seeing her late boyfriend's experience there, as well as her experience in having gone to a rehab facility.  Morgyn was adamant about wanting to continue doing things for herself.    Michele also talked about her complex family system.  She has a daughter and siblings but stated that she isn't "close" to any of them.  Veronique's family mostly lives in [place].  She is in [place] primarily by herself.  She voiced that her neighbors have become her friends but that she doesn't have anybody she is significantly close with.     Chaplain offered reflective listening, support through offering grief support resources, positive re-framing, hope and a compassionate presence.     08/20/22 1600  Clinical Encounter Type  Visited With Patient  Visit Type Initial;Spiritual support

## 2022-08-20 NOTE — Progress Notes (Signed)
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  She has schizoaffective disorder and non compliance to medicine, Poor historian.  Hemodynamically stable in the ER.  Found to have pancytopenia and anemia as below.   Assessment & Plan:   Sepsis secondary to complicated UTI with history of radiation cystitis present on admission: Presented with symptomatology of UTI, leukopenia. Blood cultures were negative. Urine cultures with pansensitive E. coli, will continue Rocephin today and treat with oral antibiotics on discharge. Started on estrogen vaginal suppository.  Pancytopenia, severe.  Severe B12 deficiency. B12 levels 50, started oral and intramuscular replacement, will suggest aggressive replacement with intramuscular B12 while in the hospital and will need weekly injections. With underlying schizoaffective disorder, it will be challenging for her to self administer B12.  On day 3 of 7 today.  Will prescribe another dose tomorrow morning. Hemoglobin 6.4 -1 unit of PRBC transfusion with adequate response.  Iron levels are adequate.  MCV 120.    Dizziness and giddiness: Chronic and persistent.  Work with Production designer, theatre/television/film multiple times today to improve mobility.  Check orthostatic blood pressures.  Schizoaffective disorder not on treatment, grief: She wants to talk to somebody.  will see if spiritual care team can talk to her.      DVT prophylaxis: enoxaparin (LOVENOX) injection 40 mg Start: 08/17/22 2200   Code Status: Full code Family Communication: None at the bedside Disposition Plan: Status is: Inpatient Remains inpatient appropriate because:  Blood transfusion, antibiotics, B12 injections.     Consultants:  Hematology oncology  Procedures:  None  Antimicrobials:  Rocephin 2 g IV daily 10/28---   Subjective:  Patient seen and examined.  She keeps talking about her boyfriend dying and how she had been sad. Starts crying in between. Her BF has died 2 years ago. She states she has a counselors. Poor historian. Difficult to get story correct. Does not look like she will take her B12 injection. Doubt she will be able to draw from a vial.   Objective: Vitals:   08/19/22 0414 08/19/22 1406 08/19/22 2102 08/20/22 0349  BP: (!) 116/59 106/60 114/68 123/73  Pulse: 94 94 92 97  Resp: '20 16 20 18  '$ Temp: 97.8 F (36.6 C) 98 F (36.7 C) 98 F (36.7 C) 98.1 F (36.7 C)  TempSrc: Oral Oral Oral Oral  SpO2: 96% 98% 96% 96%  Weight:      Height:        Intake/Output Summary (Last 24 hours) at 08/20/2022 1207 Last data filed at 08/20/2022 1000 Gross per 24 hour  Intake 600 ml  Output 2100 ml  Net -1500 ml    Filed Weights   08/17/22 1742  Weight: 104.3 kg    Examination:  General exam: Appears calm and comfortable, anxious and impulsive. Emotionally labile and keeps repeating about her boyfriend dead. Respiratory system: Clear to auscultation. Respiratory effort normal.  No added sounds. Cardiovascular system: S1 & S2 heard, RRR. No pedal edema. Gastrointestinal system: Soft.  Nontender.  Nondistended.  Bowel sound present. Central nervous system: Alert and oriented. No focal neurological deficits. Extremities: Symmetric 5 x 5 power. Skin: No rashes, lesions or ulcers Psychiatry: Judgement and insight appear normal.  Anxious  mood and flat affect.    Data Reviewed: I have personally reviewed following labs and imaging studies  CBC: Recent Labs  Lab 08/17/22 1719 08/18/22 0523 08/19/22 0522 08/20/22 0536  WBC 1.3* 1.6* 1.9* 3.4*  NEUTROABS 1.1* 0.8* 1.0* 2.5  HGB 7.6* 6.4* 8.1* 9.3*  HCT 21.6* 18.2*  22.5* 27.3*  MCV 122.0* 127.3* 111.4* 116.2*  PLT 71* 62* 79* 104*    Basic Metabolic Panel: Recent Labs  Lab 08/17/22 1719 08/18/22 0523 08/19/22 0522  NA 139 136 137  K 3.5 4.5 4.2  CL 109 107 108  CO2 '24 27 25  '$ GLUCOSE 134* 107* 101*  BUN '11 11 10  '$ CREATININE 0.66 0.51 0.55  CALCIUM 8.2* 8.1* 7.9*  MG  --  2.1 2.1  PHOS  --  3.4 4.0    GFR: Estimated Creatinine Clearance: 101.7 mL/min (by C-G formula based on SCr of 0.55 mg/dL). Liver Function Tests: Recent Labs  Lab 08/18/22 0523 08/19/22 0522  AST 16 21  ALT 13 15  ALKPHOS 83 76  BILITOT 1.1 0.7  PROT 5.1* 5.2*  ALBUMIN 3.0* 3.0*    No results for input(s): "LIPASE", "AMYLASE" in the last 168 hours. No results for input(s): "AMMONIA" in the last 168 hours. Coagulation Profile: No results for input(s): "INR", "PROTIME" in the last 168 hours. Cardiac Enzymes: No results for input(s): "CKTOTAL", "CKMB", "CKMBINDEX", "TROPONINI" in the last 168 hours. BNP (last 3 results) No results for input(s): "PROBNP" in the last 8760 hours. HbA1C: No results for input(s): "HGBA1C" in the last 72 hours. CBG: No results for input(s): "GLUCAP" in the last 168 hours. Lipid Profile: No results for input(s): "CHOL", "HDL", "LDLCALC", "TRIG", "CHOLHDL", "LDLDIRECT" in the last 72 hours. Thyroid Function Tests: No results for input(s): "TSH", "T4TOTAL", "FREET4", "T3FREE", "THYROIDAB" in the last 72 hours. Anemia Panel: Recent Labs    08/17/22 2032  VITAMINB12 <50*    Sepsis Labs: Recent Labs  Lab 08/17/22 1718 08/17/22 2032  LATICACIDVEN 2.4* 1.0     Recent Results (from the past 240 hour(s))  Blood culture (routine x 2)     Status: None (Preliminary result)   Collection Time: 08/17/22  4:45 PM   Specimen: BLOOD  Result Value Ref Range Status   Specimen Description   Final    BLOOD RIGHT ANTECUBITAL Performed at [facility], [place] [address], [place], [place] [address]    Special Requests    Final    BOTTLES DRAWN AEROBIC AND ANAEROBIC Blood Culture results may not be optimal due to an excessive volume of blood received in culture bottles Performed at [place] [address], [place], [place] [address]    Culture   Final    NO GROWTH 3 DAYS Performed at [facility], [place] [address] [place], [place] [address]    Report Status PENDING  Incomplete  Blood culture (routine x 2)     Status: None (Preliminary result)   Collection Time: 08/17/22  5:00 PM   Specimen: BLOOD  Result Value Ref Range Status   Specimen Description BLOOD SITE NOT SPECIFIED  Final   Special Requests   Final    BOTTLES DRAWN AEROBIC AND ANAEROBIC Blood Culture results may not be optimal due to an excessive volume of blood received in culture bottles   Culture   Final    NO GROWTH 3 DAYS Performed at [facility], [place] [address] [place],  [address]    Report  Status PENDING  Incomplete  Urine Culture     Status: Abnormal   Collection Time: 08/17/22  5:53 PM   Specimen: Urine, Clean Catch  Result Value Ref Range Status   Specimen Description   Final    URINE, CLEAN CATCH Performed at [facility], [place] [address] [place], [place] [address]    Special Requests   Final    NONE Performed at [facility], [place] [address], [place], [place] [address]    Culture >=100,000 COLONIES/mL ESCHERICHIA COLI (A)  Final   Report Status 08/20/2022 FINAL  Final   Organism ID, Bacteria ESCHERICHIA COLI (A)  Final      Susceptibility   Escherichia coli - MIC*    AMPICILLIN 16 INTERMEDIATE Intermediate     CEFAZOLIN <=4 SENSITIVE Sensitive     CEFEPIME <=0.12 SENSITIVE Sensitive     CEFTRIAXONE <=0.25 SENSITIVE Sensitive     CIPROFLOXACIN <=0.25 SENSITIVE Sensitive     GENTAMICIN <=1 SENSITIVE Sensitive     IMIPENEM <=0.25 SENSITIVE Sensitive     NITROFURANTOIN <=16 SENSITIVE Sensitive     TRIMETH/SULFA <=20 SENSITIVE Sensitive      AMPICILLIN/SULBACTAM 4 SENSITIVE Sensitive     PIP/TAZO <=4 SENSITIVE Sensitive     * >=100,000 COLONIES/mL ESCHERICHIA COLI  Resp Panel by RT-PCR (Flu A&B, Covid) Anterior Nasal Swab     Status: None   Collection Time: 08/17/22  8:34 PM   Specimen: Anterior Nasal Swab  Result Value Ref Range Status   SARS Coronavirus 2 by RT PCR NEGATIVE NEGATIVE Final    Comment: (NOTE) SARS-CoV-2 target nucleic acids are NOT DETECTED.  The SARS-CoV-2 RNA is generally detectable in upper respiratory specimens during the acute phase of infection. The lowest concentration of SARS-CoV-2 viral copies this assay can detect is 138 copies/mL. A negative result does not preclude SARS-Cov-2 infection and should not be used as the sole basis for treatment or other patient management decisions. A negative result may occur with  improper specimen collection/handling, submission of specimen other than nasopharyngeal swab, presence of viral mutation(s) within the areas targeted by this assay, and inadequate number of viral copies(<138 copies/mL). A negative result must be combined with clinical observations, patient history, and epidemiological information. The expected result is Negative.  Fact Sheet for Patients:  EntrepreneurPulse.com.au  Fact Sheet for Healthcare Providers:  IncredibleEmployment.be  This test is no t yet approved or cleared by the [place] FDA and  has been authorized for detection and/or diagnosis of SARS-CoV-2 by FDA under an Emergency Use Authorization (EUA). This EUA will remain  in effect (meaning this test can be used) for the duration of the COVID-19 declaration under Section 564(b)(1) of the Act, 21 U.S.C.section 360bbb-3(b)(1), unless the authorization is terminated  or revoked sooner.       Influenza A by PCR NEGATIVE NEGATIVE Final   Influenza B by PCR NEGATIVE NEGATIVE Final    Comment: (NOTE) The Xpert Xpress SARS-CoV-2/FLU/RSV  plus assay is intended as an aid in the diagnosis of influenza from Nasopharyngeal swab specimens and should not be used as a sole basis for treatment. Nasal washings and aspirates are unacceptable for Xpert Xpress SARS-CoV-2/FLU/RSV testing.  Fact Sheet for Patients: EntrepreneurPulse.com.au  Fact Sheet for Healthcare Providers: IncredibleEmployment.be  This test is not yet approved or cleared by the [place] FDA and has been authorized for detection and/or diagnosis of SARS-CoV-2 by FDA under an Emergency Use Authorization (EUA). This EUA will remain in effect (meaning  this test can be used) for the duration of the COVID-19 declaration under Section 564(b)(1) of the Act, 21 U.S.C. section 360bbb-3(b)(1), unless the authorization is terminated or revoked.  Performed at [facility], [place] [address] [place], [place] [address]          Radiology Studies: No results found.      Scheduled Meds:  cyanocobalamin  1,000 mcg Intramuscular Q24H   [START ON 08/21/2022] cyanocobalamin  1,000 mcg Intramuscular Q24H   [START ON 08/21/2022] vitamin B-12  1,000 mcg Oral Daily   enoxaparin (LOVENOX) injection  40 mg Subcutaneous Q24H   estradiol  1 Applicatorful Vaginal QHS   folic acid  1 mg Oral Daily   gabapentin  800 mg Oral QHS   miconazole   Topical BID   Continuous Infusions:  cefTRIAXone (ROCEPHIN)  IV 2 g (08/19/22 1511)     LOS: 3 days    Time spent: 35 minutes    Barb Merino, MD [facility] Pager [phone]

## 2022-08-20 NOTE — TOC Progression Note (Signed)
Transition of Care [facility]) - Progression Note    Patient Details  Name: Jordan [name] MRN: [id number] Date of Birth: 08-10-1966  Transition of Care [facility]) CM/SW [place], LCSW Phone Number: 08/20/2022, 11:21 AM  Clinical Narrative:    CSW received call from Levora Dredge [phone]) with [place] with concerns for this pt. She shares that this pt has been receiving case management services from their agency since 06/2021. She reports that this pt's mental health has greatly impacted her level of functioning and her physical health. Pt see's a provider through [employer]  online who she call her "counselor." However, this provider is not able to prescribe medications for her. Ms. Damita Dunnings does not believe she is taking or being prescribed medications for her bipolar disorder and also states she believes this pt has schizoaffective disorder. She reports pt often will stop taking medications or will over medicate. She self-medicates with gabapentin and oxycodone and will often take more than prescribed. She will go to several different providers in order to get the medications she wants. Ms. Damita Dunnings has concerns with pt being able to self-administer B-12 and feels that pt may not follow medication regimen as seen with past history.  Ms Damita Dunnings also shares that they have brought up different levels of in home services and ACTT services however, pt has declined. She is resistant to help and does not want people coming into her home as it is unkempt. Ms Damita Dunnings shares that this pt goes through cycles and will get hyper fixated on death and dying and will also focus on her boyfriend who passed away over 2 years ago.  Ms. Damita Dunnings plans to file an APS report and requests that hospital CSW also file APS report.  DC summary will need to be faxed to [facility] at [phone].    Expected Discharge Plan: Home/Self Care Barriers to Discharge: No Barriers Identified  Expected Discharge  Plan and Services Expected Discharge Plan: Home/Self Care In-house Referral: NA Discharge Planning Services: NA Post Acute Care Choice: NA Living arrangements for the past 2 months: Single Family Home                 DME Arranged: N/A DME Agency: NA                   Social Determinants of Health (SDOH) Interventions Food Insecurity Interventions: Intervention Not Indicated Transportation Interventions: Intervention Not Indicated  Readmission Risk Interventions    08/19/2022   11:26 AM 04/11/2020   11:50 AM  Readmission Risk Prevention Plan  Post Dischage Appt Complete Complete  Medication Screening Complete Complete  Transportation Screening Complete Complete

## 2022-08-20 NOTE — Progress Notes (Signed)
Mobility Specialist - Progress Note   08/20/22 1356  Mobility  Activity Ambulated with assistance in hallway  Level of Assistance Standby assist, set-up cues, supervision of patient - no hands on  Assistive Device Front wheel walker  Distance Ambulated (ft) 250 ft  Activity Response Tolerated well  Mobility Referral Yes  $Mobility charge 1 Mobility   Pt received in bed and agreed to mobility, no c/o pain during session, some dizziness during ambulation. Took one seated rest break. Returned to bed with all needs met.   Roderick Pee Mobility Specialist

## 2022-08-20 NOTE — Progress Notes (Signed)
Jordan [name]   DOB:[birth year]   RJ#:[id number]    ASSESSMENT & PLAN:   Severe pancytopenia, likely secondary to severe vitamin B12 deficiency She had remote history of pancytopenia in 2016 that has subsequently resolved Repeat blood work shows severe vitamin B12 deficiency I recommend blood transfusion support to keep hemoglobin greater than 8 and to institute daily vitamin B12 injection I will add folic acid for effective erythropoiesis Her blood counts are safe/improved She will continue daily B12 injection for 7 days I plan to bring her back on November 14 to repeat blood work   Significant dysuria, suspect vaginal atrophy/vaginitis Recommend trial of daily topical Estrace for a month before transitioning her to 3 times a week dose I will reassess in the outpatient clinic visit on November 14   Recent neutropenic fever Urine culture came back positive for E. Coli She has no more fever She can be transitioned to oral antibiotics  Significant dizziness She felt that she cannot go home due to dizziness I would defer to primary service for further investigation/evaluation   Poor memory It appears that the patient has very poor memory recall I wonder if severe B12 deficiency is the cause of this   History of vaginal cancer No evidence of disease on recent exam She will follow-up with radiation oncologist and GYN   Discharge planning I have set up outpatient follow-up and arrange for transportation for her appointment on November 14 I will sign off  Appointment details are given to the patient all questions were answered. The patient knows to call the clinic with any problems, questions or concerns.   The total time spent in the appointment was 25 minutes encounter with patients including review of chart and various tests results, discussions about plan of care and coordination of care plan  Heath Lark, MD 08/20/2022 9:10 AM  Subjective:  The patient ruminates and repeatedly  stated she is not ready to go home because she felt dizzy  Objective:  Vitals:   08/19/22 2102 08/20/22 0349  BP: 114/68 123/73  Pulse: 92 97  Resp: 20 18  Temp: 98 F (36.7 C) 98.1 F (36.7 C)  SpO2: 96% 96%     Intake/Output Summary (Last 24 hours) at 08/20/2022 0910 Last data filed at 08/20/2022 0349 Gross per 24 hour  Intake 360 ml  Output 1950 ml  Net -1590 ml    GENERAL:alert, no distress and comfortable  NEURO: alert & oriented x 3 with fluent speech, no focal motor/sensory deficits   Labs:  Recent Labs    08/17/22 1719 08/18/22 0523 08/19/22 0522  NA 139 136 137  K 3.5 4.5 4.2  CL 109 107 108  CO2 '24 27 25  '$ GLUCOSE 134* 107* 101*  BUN '11 11 10  '$ CREATININE 0.66 0.51 0.55  CALCIUM 8.2* 8.1* 7.9*  GFRNONAA >60 >60 >60  PROT  --  5.1* 5.2*  ALBUMIN  --  3.0* 3.0*  AST  --  16 21  ALT  --  13 15  ALKPHOS  --  83 76  BILITOT  --  1.1 0.7    Studies:  CT ABDOMEN PELVIS W CONTRAST  Result Date: 08/17/2022 CLINICAL DATA:  Acute abdominal pain. History of uterine/cervical cancer. EXAM: CT ABDOMEN AND PELVIS WITH CONTRAST TECHNIQUE: Multidetector CT imaging of the abdomen and pelvis was performed using the standard protocol following bolus administration of intravenous contrast. RADIATION DOSE REDUCTION: This exam was performed according to the departmental dose-optimization program which includes automated exposure  control, adjustment of the mA and/or kV according to patient size and/or use of iterative reconstruction technique. CONTRAST:  161m OMNIPAQUE IOHEXOL 300 MG/ML  SOLN COMPARISON:  CT abdomen and pelvis 05/24/2021 and 06/18/2019. FINDINGS: Lower chest: Stable 6 mm pulmonary nodule in the right lower lobe compatible with benign etiology. Hepatobiliary: No focal liver abnormality is seen. Status post cholecystectomy. No biliary dilatation. There is a calcified granuloma in the dome of the liver, unchanged. Pancreas: Unremarkable. No pancreatic ductal  dilatation or surrounding inflammatory changes. Spleen: Mildly enlarged, unchanged. Adrenals/Urinary Tract: Bilateral renal cysts are present. The largest is in the superior pole the right kidney measuring 2.7 cm. Otherwise, the kidneys and adrenal glands are within normal limits. There is mild bladder wall thickening. Stomach/Bowel: Stomach is within normal limits. Appendix appears normal. No evidence of bowel wall thickening, distention, or inflammatory changes. There is a large amount of stool throughout the colon. Vascular/Lymphatic: No significant vascular findings are present. No enlarged abdominal or pelvic lymph nodes. Reproductive: Status post hysterectomy. No adnexal masses. Other: No abdominal wall hernia or abnormality. No abdominopelvic ascites. Musculoskeletal: Chronic compression deformity of the superior endplate of L2 is unchanged. No acute fractures are seen. IMPRESSION: 1. Mild bladder wall thickening worrisome for cystitis. 2. No other acute localizing process in the abdomen or pelvis. 3. Stable mild splenomegaly. 4. Right Bosniak I benign renal cyst measuring 2.7 cm. No follow-up imaging is recommended. JACR 2018 Feb; 264-273, Management of the Incidental Renal Mass on CT, [employer] 2021; 814-848, Bosniak Classification of Cystic Renal Masses, Version 2019. Electronically Signed   By: ARonney AstersM.D.   On: 08/17/2022 19:16   CT HEAD WO CONTRAST (5MM)  Result Date: 08/17/2022 CLINICAL DATA:  Dizziness, altered mental status. EXAM: CT HEAD WITHOUT CONTRAST TECHNIQUE: Contiguous axial images were obtained from the base of the skull through the vertex without intravenous contrast. RADIATION DOSE REDUCTION: This exam was performed according to the departmental dose-optimization program which includes automated exposure control, adjustment of the mA and/or kV according to patient size and/or use of iterative reconstruction technique. COMPARISON:  CT head dated 08/15/2015. FINDINGS: Motion  artifact limits the sensitivity of the exam, particularly inferiorly. Brain: No evidence of acute infarction, hemorrhage, hydrocephalus, extra-axial collection or mass lesion/mass effect. Vascular: There are vascular calcifications in the carotid siphons. Skull: Normal. Negative for fracture or focal lesion. Sinuses/Orbits: No acute finding. Other: None. IMPRESSION: 1. No acute intracranial process, however motion artifact limits evaluation of the inferior portion of the head. Electronically Signed   By: TZerita BoersM.D.   On: 08/17/2022 19:12   DG Chest Portable 1 View  Result Date: 08/17/2022 CLINICAL DATA:  Possible sepsis EXAM: PORTABLE CHEST 1 VIEW COMPARISON:  05/04/2019 FINDINGS: The heart size and mediastinal contours are within normal limits. Both lungs are clear. The visualized skeletal structures are unremarkable. IMPRESSION: No active disease. Electronically Signed   By: PElmer PickerM.D.   On: 08/17/2022 16:56

## 2022-08-20 NOTE — Progress Notes (Signed)
Mobility Specialist - Progress Note   08/20/22 0919  Mobility  Activity Ambulated with assistance in hallway  Level of Assistance Standby assist, set-up cues, supervision of patient - no hands on  Assistive Device Front wheel walker  Distance Ambulated (ft) 140 ft  Activity Response Tolerated well  Mobility Referral Yes  $Mobility charge 1 Mobility   Pt received in bed and agreed to mobility, pain in low back and dizziness, pt took seated rest break due to dizziness, pt had back spasm when sitting. Once standing, pt returned to bed with all need met and bed alarm on.   Roderick Pee Mobility Specialist

## 2022-08-20 NOTE — Plan of Care (Signed)

## 2022-08-21 ENCOUNTER — Encounter: Payer: Self-pay | Admitting: Hematology and Oncology

## 2022-08-21 LAB — EXPECTORATED SPUTUM ASSESSMENT W GRAM STAIN, RFLX TO RESP C

## 2022-08-21 MED ORDER — IBUPROFEN 200 MG PO TABS
200.0000 mg | ORAL_TABLET | Freq: Once | ORAL | Status: AC
  Administered 2022-08-21: 200 mg via ORAL
  Filled 2022-08-21: qty 1

## 2022-08-21 MED ORDER — SODIUM CHLORIDE 0.9 % IV SOLN: INTRAVENOUS | Status: DC

## 2022-08-21 NOTE — TOC Progression Note (Signed)
Transition of Care [facility]) - Progression Note    Patient Details  Name: Tayte Childers MRN: [id number] Date of Birth: 23-Oct-1965  Transition of Care [facility]) CM/SW [place], LCSW Phone Number: 08/21/2022, 9:36 AM  Clinical Narrative:    CSW completed APS report for concerns with home environment and pt being able to care for self appropriately.    Expected Discharge Plan: Home/Self Care Barriers to Discharge: No Barriers Identified  Expected Discharge Plan and Services Expected Discharge Plan: Home/Self Care In-house Referral: NA Discharge Planning Services: NA Post Acute Care Choice: NA Living arrangements for the past 2 months: Single Family Home                 DME Arranged: N/A DME Agency: NA                   Social Determinants of Health (SDOH) Interventions Food Insecurity Interventions: Intervention Not Indicated Transportation Interventions: Intervention Not Indicated  Readmission Risk Interventions    08/19/2022   11:26 AM 04/11/2020   11:50 AM  Readmission Risk Prevention Plan  Post Dischage Appt Complete Complete  Medication Screening Complete Complete  Transportation Screening Complete Complete

## 2022-08-21 NOTE — Progress Notes (Signed)
Mobility Specialist - Progress Note   08/21/22 1300  Mobility  Activity Ambulated with assistance in hallway  Level of Assistance Standby assist, set-up cues, supervision of patient - no hands on  Assistive Device Front wheel walker  Distance Ambulated (ft) 210 ft  Activity Response Tolerated well  Mobility Referral Yes  $Mobility charge 1 Mobility   Pt received in bed and agreed to mobility, slight dizziness, a lot better than previous ambulation sessions. Pt back to bed with all needs met and bed alarm on.   Roderick Pee Mobility Specialist

## 2022-08-21 NOTE — Progress Notes (Signed)
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  She has schizoaffective disorder and non compliance to medicine, Poor historian.  Hemodynamically stable in the ER.  Found to have pancytopenia and anemia as below. Patient remains in the hospital, still having episodic fever.   Assessment & Plan:   Sepsis secondary to complicated UTI with history of radiation cystitis present on admission: Presented with symptomatology of UTI, leukopenia. Blood cultures were negative. Urine cultures with pansensitive E. coli, will continue Rocephin today as she continues to spike fever.  If another temperature spike, will draw blood cultures. Started on estrogen vaginal suppository.  Pancytopenia, severe.  Severe B12 deficiency. B12 levels 50, started oral and intramuscular replacement, will suggest aggressive replacement with intramuscular B12 while in the hospital and will need weekly injections. With underlying schizoaffective disorder, it will be challenging for her to self administer B12.  On day 4 of 7 today.  Will prescribe another dose tomorrow morning. Hemoglobin 6.4 -1 unit of PRBC transfusion with adequate response.  Iron levels are adequate.  MCV 120.    Dizziness and giddiness: Chronic and persistent.  Work with Production designer, theatre/television/film multiple times today to improve mobility.  Check orthostatic blood pressures.  Schizoaffective disorder not on treatment, grief: Education officer, museum following.  Currently not on any mental health medications.  She will follow-up with her psychiatrist.  Continue to mobilize.  Keeps complaining about dizziness but orthostatics are negative.   Improve mobility before discharge.     DVT prophylaxis: enoxaparin (LOVENOX) injection 40 mg Start: 08/17/22 2200   Code Status: Full code Family Communication: None at the bedside Disposition Plan: Status is: Inpatient Remains inpatient appropriate because: Febrile, antibiotics, B12 injections.     Consultants:  Hematology oncology  Procedures:  None  Antimicrobials:  Rocephin 2 g IV daily 10/28---   Subjective:  Patient seen and examined.  Overnight events noted.  She had a temperature of 102.9.  She was coughing up some mucoid sputum.  She was seen walking around with mobility technician, keeps complaining about dizziness however looks comfortable.  Orthostatics negative.  Objective: Vitals:   08/21/22 0514 08/21/22 0615 08/21/22 0712 08/21/22 1100  BP: 102/61 (!) 111/43 121/74 122/72  Pulse: (!) 123 (!) 116 (!) 116 99  Resp: (!) '22 20 18 18  '$ Temp: (!) 102.9 F (39.4 C) 100.3 F (37.9 C) 98.5 F (36.9 C) 98.7 F (37.1 C)  TempSrc: Oral Oral Oral Oral  SpO2: 95% 93% 95%   Weight:      Height:        Intake/Output Summary (Last 24 hours) at 08/21/2022 1255 Last data filed at 08/20/2022 2044 Gross per 24 hour  Intake 440 ml  Output --  Net 440 ml    Filed Weights   08/17/22 1742  Weight: 104.3 kg    Examination:  General exam: Appears calm and comfortable, anxious.  Emotionally labile. Respiratory system: Clear to auscultation. Respiratory effort normal.  No added sounds. Cardiovascular system: S1 & S2 heard, RRR. No pedal edema. Gastrointestinal system: Soft.  Nontender.  Nondistended.  Bowel sound present. Central nervous system: Alert and oriented. No focal neurological deficits. Extremities: Symmetric 5 x 5 power. Skin: No rashes, lesions or  ulcers Psychiatry: Judgement and insight appear normal.  Anxious mood and flat affect.    Data Reviewed: I have personally reviewed following labs and imaging studies  CBC: Recent Labs  Lab  08/17/22 1719 08/18/22 0523 08/19/22 0522 08/20/22 0536  WBC 1.3* 1.6* 1.9* 3.4*  NEUTROABS 1.1* 0.8* 1.0* 2.5  HGB 7.6* 6.4* 8.1* 9.3*  HCT 21.6* 18.2* 22.5* 27.3*  MCV 122.0* 127.3* 111.4* 116.2*  PLT 71* 62* 79* 104*    Basic Metabolic Panel: Recent Labs  Lab 08/17/22 1719 08/18/22 0523 08/19/22 0522  NA 139 136 137  K 3.5 4.5 4.2  CL 109 107 108  CO2 '24 27 25  '$ GLUCOSE 134* 107* 101*  BUN '11 11 10  '$ CREATININE 0.66 0.51 0.55  CALCIUM 8.2* 8.1* 7.9*  MG  --  2.1 2.1  PHOS  --  3.4 4.0    GFR: Estimated Creatinine Clearance: 101.7 mL/min (by C-G formula based on SCr of 0.55 mg/dL). Liver Function Tests: Recent Labs  Lab 08/18/22 0523 08/19/22 0522  AST 16 21  ALT 13 15  ALKPHOS 83 76  BILITOT 1.1 0.7  PROT 5.1* 5.2*  ALBUMIN 3.0* 3.0*    No results for input(s): "LIPASE", "AMYLASE" in the last 168 hours. No results for input(s): "AMMONIA" in the last 168 hours. Coagulation Profile: No results for input(s): "INR", "PROTIME" in the last 168 hours. Cardiac Enzymes: No results for input(s): "CKTOTAL", "CKMB", "CKMBINDEX", "TROPONINI" in the last 168 hours. BNP (last 3 results) No results for input(s): "PROBNP" in the last 8760 hours. HbA1C: No results for input(s): "HGBA1C" in the last 72 hours. CBG: No results for input(s): "GLUCAP" in the last 168 hours. Lipid Profile: No results for input(s): "CHOL", "HDL", "LDLCALC", "TRIG", "CHOLHDL", "LDLDIRECT" in the last 72 hours. Thyroid Function Tests: No results for input(s): "TSH", "T4TOTAL", "FREET4", "T3FREE", "THYROIDAB" in the last 72 hours. Anemia Panel: No results for input(s): "VITAMINB12", "FOLATE", "FERRITIN", "TIBC", "IRON", "RETICCTPCT" in the last 72 hours.  Sepsis Labs: Recent Labs  Lab 08/17/22 1718 08/17/22 2032  LATICACIDVEN 2.4* 1.0     Recent Results (from the past 240 hour(s))  Blood culture (routine x 2)     Status: None (Preliminary result)   Collection Time: 08/17/22  4:45 PM    Specimen: BLOOD  Result Value Ref Range Status   Specimen Description   Final    BLOOD RIGHT ANTECUBITAL Performed at [facility] [address] [place], [place] [address]    Special Requests   Final    BOTTLES DRAWN AEROBIC AND ANAEROBIC Blood Culture results may not be optimal due to an excessive volume of blood received in culture bottles Performed at [facility] [address] [place], [place] [address]    Culture   Final    NO GROWTH 4 DAYS Performed at [facility], [place] [address], [place], [place] [address]    Report Status PENDING  Incomplete  Blood culture (routine x 2)     Status: None (Preliminary result)   Collection Time: 08/17/22  5:00 PM   Specimen: BLOOD  Result Value Ref Range Status   Specimen Description BLOOD SITE NOT SPECIFIED  Final   Special Requests   Final    BOTTLES DRAWN AEROBIC AND ANAEROBIC Blood Culture results may not be optimal due to an excessive volume of blood received in culture bottles   Culture   Final    NO GROWTH 4 DAYS Performed at [facility]  Lab, [address], [place], [place] [address]    Report Status PENDING  Incomplete  Urine Culture     Status: Abnormal   Collection Time: 08/17/22  5:53 PM   Specimen: Urine, Clean Catch  Result Value Ref Range Status   Specimen Description   Final    URINE, CLEAN CATCH Performed at [facility], [place] [address], [place], [place] [address]    Special Requests   Final    NONE Performed at [facility], [place] [address] [place], [place] [address]    Culture >=100,000 COLONIES/mL ESCHERICHIA COLI (A)  Final   Report Status 08/20/2022 FINAL  Final   Organism ID, Bacteria ESCHERICHIA COLI (A)  Final      Susceptibility   Escherichia coli - MIC*    AMPICILLIN 16 INTERMEDIATE Intermediate     CEFAZOLIN <=4 SENSITIVE Sensitive     CEFEPIME <=0.12 SENSITIVE Sensitive     CEFTRIAXONE <=0.25 SENSITIVE  Sensitive     CIPROFLOXACIN <=0.25 SENSITIVE Sensitive     GENTAMICIN <=1 SENSITIVE Sensitive     IMIPENEM <=0.25 SENSITIVE Sensitive     NITROFURANTOIN <=16 SENSITIVE Sensitive     TRIMETH/SULFA <=20 SENSITIVE Sensitive     AMPICILLIN/SULBACTAM 4 SENSITIVE Sensitive     PIP/TAZO <=4 SENSITIVE Sensitive     * >=100,000 COLONIES/mL ESCHERICHIA COLI  Resp Panel by RT-PCR (Flu A&B, Covid) Anterior Nasal Swab     Status: None   Collection Time: 08/17/22  8:34 PM   Specimen: Anterior Nasal Swab  Result Value Ref Range Status   SARS Coronavirus 2 by RT PCR NEGATIVE NEGATIVE Final    Comment: (NOTE) SARS-CoV-2 target nucleic acids are NOT DETECTED.  The SARS-CoV-2 RNA is generally detectable in upper respiratory specimens during the acute phase of infection. The lowest concentration of SARS-CoV-2 viral copies this assay can detect is 138 copies/mL. A negative result does not preclude SARS-Cov-2 infection and should not be used as the sole basis for treatment or other patient management decisions. A negative result may occur with  improper specimen collection/handling, submission of specimen other than nasopharyngeal swab, presence of viral mutation(s) within the areas targeted by this assay, and inadequate number of viral copies(<138 copies/mL). A negative result must be combined with clinical observations, patient history, and epidemiological information. The expected result is Negative.  Fact Sheet for Patients:  EntrepreneurPulse.com.au  Fact Sheet for Healthcare Providers:  IncredibleEmployment.be  This test is no t yet approved or cleared by the [place] FDA and  has been authorized for detection and/or diagnosis of SARS-CoV-2 by FDA under an Emergency Use Authorization (EUA). This EUA will remain  in effect (meaning this test can be used) for the duration of the COVID-19 declaration under Section 564(b)(1) of the Act, 21 U.S.C.section  360bbb-3(b)(1), unless the authorization is terminated  or revoked sooner.       Influenza A by PCR NEGATIVE NEGATIVE Final   Influenza B by PCR NEGATIVE NEGATIVE Final    Comment: (NOTE) The Xpert Xpress SARS-CoV-2/FLU/RSV plus assay is intended as an aid in the diagnosis of influenza from Nasopharyngeal swab specimens and should not be used as a sole basis for treatment. Nasal washings and aspirates are unacceptable for Xpert Xpress SARS-CoV-2/FLU/RSV testing.  Fact Sheet for Patients: EntrepreneurPulse.com.au  Fact Sheet for Healthcare Providers: IncredibleEmployment.be  This test is not yet approved or cleared by the [place] FDA and has been authorized for detection and/or diagnosis of SARS-CoV-2 by FDA under  an Emergency Use Authorization (EUA). This EUA will remain in effect (meaning this test can be used) for the duration of the COVID-19 declaration under Section 564(b)(1) of the Act, 21 U.S.C. section 360bbb-3(b)(1), unless the authorization is terminated or revoked.  Performed at [facility], [place] [address], [place], [place] [address]   Expectorated Sputum Assessment w Gram Stain, Rflx to Resp Cult     Status: None   Collection Time: 08/21/22  9:58 AM   Specimen: Expectorated Sputum  Result Value Ref Range Status   Specimen Description EXPECTORATED SPUTUM  Final   Special Requests NONE  Final   Sputum evaluation   Final    THIS SPECIMEN IS ACCEPTABLE FOR SPUTUM CULTURE Performed at [facility], [place] [address] [place], [place] [address]    Report Status 08/21/2022 FINAL  Final         Radiology Studies: No results found.      Scheduled Meds:  cyanocobalamin  1,000 mcg Intramuscular Q24H   enoxaparin (LOVENOX) injection  40 mg Subcutaneous Q24H   estradiol  1 Applicatorful Vaginal QHS   folic acid  1 mg Oral Daily   gabapentin  800 mg Oral QHS   miconazole   Topical  BID   Continuous Infusions:  sodium chloride     cefTRIAXone (ROCEPHIN)  IV 2 g (08/20/22 1606)     LOS: 4 days    Time spent: 35 minutes    Barb Merino, MD [facility] Pager [phone]

## 2022-08-21 NOTE — Progress Notes (Deleted)
   08/21/22 0314  Assess: MEWS Score  Temp (!) 102.7 F (39.3 C)  BP 111/88  MAP (mmHg) 96  Pulse Rate (!) 117  Resp 18  SpO2 93 %  O2 Device Room Air  Assess: MEWS Score  MEWS Temp 2  MEWS Systolic 0  MEWS Pulse 2  MEWS RR 0  MEWS LOC 0  MEWS Score 4  MEWS Score Color Red  Assess: if the MEWS score is Yellow or Red  Were vital signs taken at a resting state? Yes  Focused Assessment No change from prior assessment  Does the patient meet 2 or more of the SIRS criteria? Yes  Does the patient have a confirmed or suspected source of infection? Yes  Provider and Rapid Response Notified? Yes  MEWS guidelines implemented *See Row Information* Yes  Take Vital Signs  Increase Vital Sign Frequency  Red: Q 1hr X 4 then Q 4hr X 4, if remains red, continue Q 4hrs  Escalate  MEWS: Escalate Red: discuss with charge nurse/RN and provider, consider discussing with RRT  Notify: Charge Nurse/RN  Name of Charge Nurse/RN Notified Tom RN  Date Charge Nurse/RN Notified 08/21/22  Notify: Provider  Provider Name/Title Olena Heckle NP  Date Provider Notified 08/21/22  Time Provider Notified [phone]  Method of Notification Page  Notification Reason Change in status  Provider response No new orders  Notify: Rapid Response  Name of Rapid Response RN Notified Mandy RN  Date Rapid Response Notified 08/21/22  Time Rapid Response Notified 0324  Assess: SIRS CRITERIA  SIRS Temperature  1  SIRS Pulse 1  SIRS Respirations  0  SIRS WBC 1  SIRS Score Sum  3

## 2022-08-21 NOTE — Progress Notes (Signed)
   08/21/22 0514  Assess: MEWS Score  Temp (!) 102.9 F (39.4 C)  BP 102/61  MAP (mmHg) 71  Pulse Rate (!) 123  Resp (!) 22  SpO2 95 %  O2 Device Room Air  Assess: MEWS Score  MEWS Temp 2  MEWS Systolic 0  MEWS Pulse 2  MEWS RR 1  MEWS LOC 0  MEWS Score 5  MEWS Score Color Red  Assess: if the MEWS score is Yellow or Red  Were vital signs taken at a resting state? Yes  Focused Assessment Change from prior assessment (see assessment flowsheet)  Does the patient meet 2 or more of the SIRS criteria? Yes  Does the patient have a confirmed or suspected source of infection? Yes  Provider and Rapid Response Notified? Yes  MEWS guidelines implemented *See Row Information* No, previously red, continue vital signs every 4 hours  Treat  MEWS Interventions Escalated (See documentation below)  Escalate  MEWS: Escalate Red: discuss with charge nurse/RN and provider, consider discussing with RRT  Notify: Charge Nurse/RN  Name of Charge Nurse/RN Notified Tom RN  Notify: Provider  Provider Name/Title Olena Heckle NP  Date Provider Notified 08/21/22  Time Provider Notified [phone]  Method of Notification Page  Provider response See new orders  Document  Patient Outcome Other (Comment) (remains on unit)  Assess: SIRS CRITERIA  SIRS Temperature  1  SIRS Pulse 1  SIRS Respirations  1  SIRS WBC 1  SIRS Score Sum  4

## 2022-08-21 NOTE — Progress Notes (Signed)
   08/21/22 0314  Assess: MEWS Score  Temp (!) 102.7 F (39.3 C)  BP 111/88  MAP (mmHg) 96  Pulse Rate (!) 117  Resp 18  SpO2 93 %  O2 Device Room Air  Assess: MEWS Score  MEWS Temp 2  MEWS Systolic 0  MEWS Pulse 2  MEWS RR 0  MEWS LOC 0  MEWS Score 4  MEWS Score Color Red  Assess: if the MEWS score is Yellow or Red  Were vital signs taken at a resting state? Yes  Focused Assessment No change from prior assessment  Does the patient meet 2 or more of the SIRS criteria? Yes  Does the patient have a confirmed or suspected source of infection? Yes  Provider and Rapid Response Notified? Yes  MEWS guidelines implemented *See Row Information* Yes  Take Vital Signs  Increase Vital Sign Frequency  Red: Q 1hr X 4 then Q 4hr X 4, if remains red, continue Q 4hrs  Escalate  MEWS: Escalate Red: discuss with charge nurse/RN and provider, consider discussing with RRT  Notify: Charge Nurse/RN  Name of Charge Nurse/RN Notified Tom RN  Date Charge Nurse/RN Notified 08/21/22  Notify: Provider  Provider Name/Title Olena Heckle NP  Date Provider Notified 08/21/22  Time Provider Notified [phone]  Method of Notification Page  Notification Reason Change in status  Provider response No new orders  Notify: Rapid Response  Name of Rapid Response RN Notified Mandy RN  Date Rapid Response Notified 08/21/22  Time Rapid Response Notified 8242  Document  Patient Outcome Other (Comment) (remains on unit)  Assess: SIRS CRITERIA  SIRS Temperature  1  SIRS Pulse 1  SIRS Respirations  0  SIRS WBC 1  SIRS Score Sum  3

## 2022-08-21 NOTE — Progress Notes (Signed)
Mobility Specialist - Progress Note   08/21/22 1000  Mobility  Activity Ambulated with assistance in hallway  Level of Assistance Contact guard assist, steadying assist  Assistive Device Front wheel walker  Distance Ambulated (ft) 130 ft  Activity Response Tolerated well  Mobility Referral Yes  $Mobility charge 1 Mobility   Pt received in bed and agreed to mobility, no c/o pain during session. Dizziness prior and during session. Pt back to bed with all needs met and still dizzy.     Mobility Specialist   

## 2022-08-22 DIAGNOSIS — N390 Urinary tract infection, site not specified: Secondary | ICD-10-CM | POA: Diagnosis not present

## 2022-08-22 LAB — CBC WITH DIFFERENTIAL/PLATELET
Abs Immature Granulocytes: 0.04 10*3/uL (ref 0.00–0.07)
Basophils Absolute: 0 10*3/uL (ref 0.0–0.1)
Basophils Relative: 0 %
Eosinophils Absolute: 0 10*3/uL (ref 0.0–0.5)
Eosinophils Relative: 0 %
HCT: 25.5 % — ABNORMAL LOW (ref 36.0–46.0)
Hemoglobin: 8.6 g/dL — ABNORMAL LOW (ref 12.0–15.0)
Immature Granulocytes: 2 %
Lymphocytes Relative: 35 %
Lymphs Abs: 0.8 10*3/uL (ref 0.7–4.0)
MCH: 39.6 pg — ABNORMAL HIGH (ref 26.0–34.0)
MCHC: 33.7 g/dL (ref 30.0–36.0)
MCV: 117.5 fL — ABNORMAL HIGH (ref 80.0–100.0)
Monocytes Absolute: 0.5 10*3/uL (ref 0.1–1.0)
Monocytes Relative: 20 %
Neutro Abs: 1 10*3/uL — ABNORMAL LOW (ref 1.7–7.7)
Neutrophils Relative %: 43 %
Platelets: 123 10*3/uL — ABNORMAL LOW (ref 150–400)
RBC: 2.17 MIL/uL — ABNORMAL LOW (ref 3.87–5.11)
RDW: 22.8 % — ABNORMAL HIGH (ref 11.5–15.5)
WBC: 2.3 10*3/uL — ABNORMAL LOW (ref 4.0–10.5)
nRBC: 0 % (ref 0.0–0.2)

## 2022-08-22 LAB — CULTURE, BLOOD (ROUTINE X 2)
Culture: NO GROWTH
Culture: NO GROWTH

## 2022-08-22 LAB — COMPREHENSIVE METABOLIC PANEL
ALT: 16 U/L (ref 0–44)
AST: 21 U/L (ref 15–41)
Albumin: 2.8 g/dL — ABNORMAL LOW (ref 3.5–5.0)
Alkaline Phosphatase: 72 U/L (ref 38–126)
Anion gap: 6 (ref 5–15)
BUN: 11 mg/dL (ref 6–20)
CO2: 24 mmol/L (ref 22–32)
Calcium: 8.2 mg/dL — ABNORMAL LOW (ref 8.9–10.3)
Chloride: 108 mmol/L (ref 98–111)
Creatinine, Ser: 0.64 mg/dL (ref 0.44–1.00)
GFR, Estimated: >60 mL/min (ref >60)
Glucose, Bld: 95 mg/dL (ref 70–99)
Potassium: 4.3 mmol/L (ref 3.5–5.1)
Sodium: 138 mmol/L (ref 135–145)
Total Bilirubin: 0.8 mg/dL (ref 0.3–1.2)
Total Protein: 5.2 g/dL — ABNORMAL LOW (ref 6.5–8.1)

## 2022-08-22 LAB — CULTURE, RESPIRATORY W GRAM STAIN: Culture: NORMAL

## 2022-08-22 LAB — MAGNESIUM: Magnesium: 2.2 mg/dL (ref 1.7–2.4)

## 2022-08-22 LAB — PHOSPHORUS: Phosphorus: 4.6 mg/dL (ref 2.5–4.6)

## 2022-08-22 MED ORDER — CYANOCOBALAMIN 1000 MCG/ML IJ SOLN: 1000.0000 ug | INTRAMUSCULAR | 0 refills | Status: DC

## 2022-08-22 MED ORDER — FOLIC ACID 1 MG PO TABS: 1.0000 mg | ORAL_TABLET | Freq: Every day | ORAL | 0 refills | Status: AC

## 2022-08-22 MED ORDER — ESTRADIOL 0.1 MG/GM VA CREA: 1.0000 | TOPICAL_CREAM | Freq: Every day | VAGINAL | 12 refills | Status: DC

## 2022-08-22 MED ORDER — VITAMIN B-12 1000 MCG PO TABS: 1000.0000 ug | ORAL_TABLET | Freq: Every day | ORAL | 0 refills | Status: AC

## 2022-08-22 MED ORDER — CEPHALEXIN 500 MG PO CAPS: 500.0000 mg | ORAL_CAPSULE | Freq: Three times a day (TID) | ORAL | 0 refills | Status: AC

## 2022-08-22 NOTE — Progress Notes (Signed)
Mobility Specialist - Progress Note   08/22/22 0949  Mobility  Activity Ambulated with assistance in hallway  Level of Assistance Modified independent, requires aide device or extra time  Assistive Device Front wheel walker  Distance Ambulated (ft) 250 ft  Activity Response Tolerated well  Mobility Referral Yes  $Mobility charge 1 Mobility   Pt received in bed and agreeable to mobility. C/o feeling dizzy post ambulation, but stated that was normal for her. No other complaints during mobility. Pt to bed after session.    [facility]

## 2022-08-22 NOTE — Plan of Care (Signed)
Patient is adequate for discharge, she is stable with no concerns at this time. AVS to be reviewed and IV lines to be removed.

## 2022-08-22 NOTE — Discharge Summary (Signed)
Physician Discharge Summary  Jordan [name] XBJ:[id number] DOB: 04-15-1966 DOA: 08/17/2022  PCP: [name], NP  Admit date: 08/17/2022 Discharge date: 08/22/2022  Admitted From: Home Disposition: Home  Recommendations for Outpatient Follow-up:  Follow up with PCP in 1-2 weeks Please obtain BMP/CBC in one week 3.  Follow-up with mental health counselor, psychiatry to initiate your treatment.  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  She has schizoaffective disorder and non compliance to medicine, Poor historian.  Hemodynamically stable in the ER.  Found to have pancytopenia and anemia as below. Patient remained in the hospital needing IV antibiotics and injectable vitamin B12.   Sepsis secondary to complicated UTI with history of radiation cystitis present on admission: Presented with symptomatology of UTI, leukopenia. Blood cultures were negative. Urine cultures with pansensitive E. coli, currently on Rocephin day 5 today.  Will discharged with 5 additional days of Keflex to complete 10 days of therapy for complicated UTI.  Patient may need chronic suppressive therapy with antibiotics, she will follow-up with urology.   Started on estrogen vaginal suppository.   Pancytopenia, severe.  Severe B12 deficiency. B12 levels were 50, started oral and intramuscular replacement, will suggest aggressive replacement with intramuscular B12 while in the hospital and will need weekly injections. Patient received 5 days of injectable vitamin B12 while in the hospital. With underlying  schizoaffective disorder, it will be challenging for her to self administer B12.  She has been is scheduled to follow-up at hematology clinic to get B12 injections every week, however doubt her compliance.  Will prescribe oral B12 1000 mcg daily on discharge today. Hemoglobin 6.4 -1 unit of PRBC transfusion with adequate response.  Iron levels are adequate.  MCV 120.     Dizziness and giddiness: Chronic issues.  Did not have any evidence of orthostatic changes.  Gait is stable and able to move around well.   Schizoaffective disorder not on treatment, grief: Education officer, museum following.  Currently not on any mental health medications.  She will follow-up with her psychiatrist.   Stable for discharge.  She will benefit with home safety evaluation, physical therapy, Education officer, museum and RN visit.    Discharge Diagnoses:  Principal Problem:   Complicated UTI (urinary tract infection) Active Problems:   Bipolar disorder ([place])   Pancytopenia (H[facility])   Megaloblastic anemia   Squamous cell carcinoma of vagina [facility])    Discharge Instructions  Discharge Instructions     Diet general   Complete by: As directed    Increase activity slowly   Complete by: As directed       Allergies as of 08/22/2022       Reactions   Ciprofloxacin Swelling   Lips swell, tongue swells, face swells   Citalopram Other (See Comments)   Possible cause of pancytopenia. Swelling of tongue, face and throat   Lamotrigine Other (See Comments)   Possible cause of pancytopenia. Swelling of face, throat and tongue   Sulfa Antibiotics Anaphylaxis   Tramadol Anaphylaxis, Shortness Of Breath, Swelling        Medication List     STOP taking these medications    fluconazole 100 MG tablet Commonly known as: DIFLUCAN   metroNIDAZOLE 0.75 %  vaginal gel Commonly known as: METROGEL VAGINAL   nitrofurantoin (macrocrystal-monohydrate) 100 MG capsule Commonly known as: Macrobid   oxybutynin 15 MG 24 hr tablet Commonly  known as: DITROPAN XL   phenazopyridine 200 MG tablet Commonly known as: Pyridium       TAKE these medications    cephALEXin 500 MG capsule Commonly known as: KEFLEX Take 1 capsule (500 mg total) by mouth 3 (three) times daily for 5 days.   cyanocobalamin 1000 MCG/ML injection Commonly known as: VITAMIN B12 Inject 1 mL (1,000 mcg total) into the muscle every 7 (seven) days.   cyanocobalamin 1000 MCG tablet Commonly known as: VITAMIN B12 Take 1 tablet (1,000 mcg total) by mouth daily.   estradiol 0.1 MG/GM vaginal cream Commonly known as: ESTRACE Place 1 Applicatorful vaginally at bedtime.   folic acid 1 MG tablet Commonly known as: FOLVITE Take 1 tablet (1 mg total) by mouth daily. Start taking on: August 23, 2022   gabapentin 800 MG tablet Commonly known as: NEURONTIN Take 800 mg by mouth 3 (three) times daily.   Oxycodone HCl 10 MG Tabs Take 10 mg by mouth daily as needed.        Allergies  Allergen Reactions   Ciprofloxacin Swelling    Lips swell, tongue swells, face swells   Citalopram Other (See Comments)    Possible cause of pancytopenia. Swelling of tongue, face and throat   Lamotrigine Other (See Comments)    Possible cause of pancytopenia. Swelling of face, throat and tongue   Sulfa Antibiotics Anaphylaxis   Tramadol Anaphylaxis, Shortness Of Breath and Swelling    Consultations: Hematology   Procedures/Studies: CT ABDOMEN PELVIS W CONTRAST  Result Date: 08/17/2022 CLINICAL DATA:  Acute abdominal pain. History of uterine/cervical cancer. EXAM: CT ABDOMEN AND PELVIS WITH CONTRAST TECHNIQUE: Multidetector CT imaging of the abdomen and pelvis was performed using the standard protocol following bolus administration of intravenous contrast. RADIATION DOSE REDUCTION: This exam was performed according to the departmental dose-optimization program which includes automated exposure control, adjustment of the mA and/or kV according to patient size and/or  use of iterative reconstruction technique. CONTRAST:  160m OMNIPAQUE IOHEXOL 300 MG/ML  SOLN COMPARISON:  CT abdomen and pelvis 05/24/2021 and 06/18/2019. FINDINGS: Lower chest: Stable 6 mm pulmonary nodule in the right lower lobe compatible with benign etiology. Hepatobiliary: No focal liver abnormality is seen. Status post cholecystectomy. No biliary dilatation. There is a calcified granuloma in the dome of the liver, unchanged. Pancreas: Unremarkable. No pancreatic ductal dilatation or surrounding inflammatory changes. Spleen: Mildly enlarged, unchanged. Adrenals/Urinary Tract: Bilateral renal cysts are present. The largest is in the superior pole the right kidney measuring 2.7 cm. Otherwise, the kidneys and adrenal glands are within normal limits. There is mild bladder wall thickening. Stomach/Bowel: Stomach is within normal limits. Appendix appears normal. No evidence of bowel wall thickening, distention, or inflammatory changes. There is a large amount of stool throughout the colon. Vascular/Lymphatic: No significant vascular findings are present. No enlarged abdominal or pelvic lymph nodes. Reproductive: Status post hysterectomy. No adnexal masses. Other: No abdominal wall hernia or abnormality. No abdominopelvic ascites. Musculoskeletal: Chronic compression deformity of the superior endplate of L2 is unchanged. No acute fractures are seen. IMPRESSION: 1. Mild bladder wall thickening worrisome for cystitis. 2. No other acute localizing process in the abdomen or pelvis. 3. Stable mild splenomegaly. 4. Right Bosniak I benign renal cyst measuring 2.7 cm. No follow-up imaging is recommended. JACR 2018 Feb; 264-273, Management of the Incidental Renal Mass  on CT, [employer] 2021; 814-848, Bosniak Classification of Cystic Renal Masses, Version 2019. Electronically Signed   By: Ronney Asters M.D.   On: 08/17/2022 19:16   CT HEAD WO CONTRAST (5MM)  Result Date: 08/17/2022 CLINICAL DATA:  Dizziness, altered  mental status. EXAM: CT HEAD WITHOUT CONTRAST TECHNIQUE: Contiguous axial images were obtained from the base of the skull through the vertex without intravenous contrast. RADIATION DOSE REDUCTION: This exam was performed according to the departmental dose-optimization program which includes automated exposure control, adjustment of the mA and/or kV according to patient size and/or use of iterative reconstruction technique. COMPARISON:  CT head dated 08/15/2015. FINDINGS: Motion artifact limits the sensitivity of the exam, particularly inferiorly. Brain: No evidence of acute infarction, hemorrhage, hydrocephalus, extra-axial collection or mass lesion/mass effect. Vascular: There are vascular calcifications in the carotid siphons. Skull: Normal. Negative for fracture or focal lesion. Sinuses/Orbits: No acute finding. Other: None. IMPRESSION: 1. No acute intracranial process, however motion artifact limits evaluation of the inferior portion of the head. Electronically Signed   By: Zerita Boers M.D.   On: 08/17/2022 19:12   DG Chest Portable 1 View  Result Date: 08/17/2022 CLINICAL DATA:  Possible sepsis EXAM: PORTABLE CHEST 1 VIEW COMPARISON:  05/04/2019 FINDINGS: The heart size and mediastinal contours are within normal limits. Both lungs are clear. The visualized skeletal structures are unremarkable. IMPRESSION: No active disease. Electronically Signed   By: Elmer Picker M.D.   On: 08/17/2022 16:56   (Echo, Carotid, EGD, Colonoscopy, ERCP)    Subjective: Patient was seen and examined.  Denies any complaints.  She looks more quiet and composed today and able to have conversation.  She tells me she is safe to go home and feels that she can take care of herself.  She had temperature 100.5 overnight.  Urinating without difficulties.   Discharge Exam: Vitals:   08/22/22 0025 08/22/22 0503  BP: (!) 100/57 110/61  Pulse:  89  Resp: 18 18  Temp: 100 F (37.8 C) 98.2 F (36.8 C)  SpO2: 94% 96%    Vitals:   08/21/22 1857 08/21/22 2302 08/22/22 0025 08/22/22 0503  BP: 132/84 117/62 (!) 100/57 110/61  Pulse: 94 94  89  Resp:  '20 18 18  '$ Temp: 98.4 F (36.9 C) (!) 100.5 F (38.1 C) 100 F (37.8 C) 98.2 F (36.8 C)  TempSrc: Oral Oral Oral Oral  SpO2: 96% 92% 94% 96%  Weight:      Height:        General: Pt is alert, awake, not in acute distress Walking around in the hallway with mobility technician. Patient is alert and awake.  She is oriented x4.  Just fixated repeatedly talks same thing. Cardiovascular: RRR, S1/S2 +, no rubs, no gallops Respiratory: CTA bilaterally, no wheezing, no rhonchi Abdominal: Soft, NT, ND, bowel sounds + Extremities: no edema, no cyanosis    The results of significant diagnostics from this hospitalization (including imaging, microbiology, ancillary and laboratory) are listed below for reference.     Microbiology: Recent Results (from the past 240 hour(s))  Blood culture (routine x 2)     Status: None   Collection Time: 08/17/22  4:45 PM   Specimen: BLOOD  Result Value Ref Range Status   Specimen Description   Final    BLOOD RIGHT ANTECUBITAL Performed at [facility] [address] [place], [place] [address]    Special Requests   Final    BOTTLES DRAWN AEROBIC AND ANAEROBIC Blood Culture  results may not be optimal due to an excessive volume of blood received in culture bottles Performed at [facility] [address] [place], [place] [address]    Culture   Final    NO GROWTH 5 DAYS Performed at [facility], [place] [address], [place], [place] [address]    Report Status 08/22/2022 FINAL  Final  Blood culture (routine x 2)     Status: None   Collection Time: 08/17/22  5:00 PM   Specimen: BLOOD  Result Value Ref Range Status   Specimen Description BLOOD SITE NOT SPECIFIED  Final   Special Requests   Final    BOTTLES DRAWN AEROBIC AND ANAEROBIC Blood Culture results may not be optimal due  to an excessive volume of blood received in culture bottles   Culture   Final    NO GROWTH 5 DAYS Performed at [facility], [place] [address], [place], [place] [address]    Report Status 08/22/2022 FINAL  Final  Urine Culture     Status: Abnormal   Collection Time: 08/17/22  5:53 PM   Specimen: Urine, Clean Catch  Result Value Ref Range Status   Specimen Description   Final    URINE, CLEAN CATCH Performed at [facility], [place] [address], [place], [place] [address]    Special Requests   Final    NONE Performed at [facility], [place] [address] [place], [place] [address]    Culture >=100,000 COLONIES/mL ESCHERICHIA COLI (A)  Final   Report Status 08/20/2022 FINAL  Final   Organism ID, Bacteria ESCHERICHIA COLI (A)  Final      Susceptibility   Escherichia coli - MIC*    AMPICILLIN 16 INTERMEDIATE Intermediate     CEFAZOLIN <=4 SENSITIVE Sensitive     CEFEPIME <=0.12 SENSITIVE Sensitive     CEFTRIAXONE <=0.25 SENSITIVE Sensitive     CIPROFLOXACIN <=0.25 SENSITIVE Sensitive     GENTAMICIN <=1 SENSITIVE Sensitive     IMIPENEM <=0.25 SENSITIVE Sensitive     NITROFURANTOIN <=16 SENSITIVE Sensitive     TRIMETH/SULFA <=20 SENSITIVE Sensitive     AMPICILLIN/SULBACTAM 4 SENSITIVE Sensitive     PIP/TAZO <=4 SENSITIVE Sensitive     * >=100,000 COLONIES/mL ESCHERICHIA COLI  Resp Panel by RT-PCR (Flu A&B, Covid) Anterior Nasal Swab     Status: None   Collection Time: 08/17/22  8:34 PM   Specimen: Anterior Nasal Swab  Result Value Ref Range Status   SARS Coronavirus 2 by RT PCR NEGATIVE NEGATIVE Final    Comment: (NOTE) SARS-CoV-2 target nucleic acids are NOT DETECTED.  The SARS-CoV-2 RNA is generally detectable in upper respiratory specimens during the acute phase of infection. The lowest concentration of SARS-CoV-2 viral copies this assay can detect is 138 copies/mL. A negative result does not preclude SARS-Cov-2 infection and should not be  used as the sole basis for treatment or other patient management decisions. A negative result may occur with  improper specimen collection/handling, submission of specimen other than nasopharyngeal swab, presence of viral mutation(s) within the areas targeted by this assay, and inadequate number of viral copies(<138 copies/mL). A negative result must be combined with clinical observations, patient history, and epidemiological information. The expected result is Negative.  Fact Sheet for Patients:  EntrepreneurPulse.com.au  Fact Sheet for Healthcare Providers:  IncredibleEmployment.be  This test is no t yet approved or cleared by the [place] FDA and  has been authorized for detection and/or diagnosis  of SARS-CoV-2 by FDA under an Emergency Use Authorization (EUA). This EUA will remain  in effect (meaning this test can be used) for the duration of the COVID-19 declaration under Section 564(b)(1) of the Act, 21 U.S.C.section 360bbb-3(b)(1), unless the authorization is terminated  or revoked sooner.       Influenza A by PCR NEGATIVE NEGATIVE Final   Influenza B by PCR NEGATIVE NEGATIVE Final    Comment: (NOTE) The Xpert Xpress SARS-CoV-2/FLU/RSV plus assay is intended as an aid in the diagnosis of influenza from Nasopharyngeal swab specimens and should not be used as a sole basis for treatment. Nasal washings and aspirates are unacceptable for Xpert Xpress SARS-CoV-2/FLU/RSV testing.  Fact Sheet for Patients: EntrepreneurPulse.com.au  Fact Sheet for Healthcare Providers: IncredibleEmployment.be  This test is not yet approved or cleared by the [place] FDA and has been authorized for detection and/or diagnosis of SARS-CoV-2 by FDA under an Emergency Use Authorization (EUA). This EUA will remain in effect (meaning this test can be used) for the duration of the COVID-19 declaration under Section  564(b)(1) of the Act, 21 U.S.C. section 360bbb-3(b)(1), unless the authorization is terminated or revoked.  Performed at [facility], [place] [address] [place], [place] [address]   Expectorated Sputum Assessment w Gram Stain, Rflx to Resp Cult     Status: None   Collection Time: 08/21/22  9:58 AM   Specimen: Expectorated Sputum  Result Value Ref Range Status   Specimen Description EXPECTORATED SPUTUM  Final   Special Requests NONE  Final   Sputum evaluation   Final    THIS SPECIMEN IS ACCEPTABLE FOR SPUTUM CULTURE Performed at [facility], [place] [address], [place], [place] [address]    Report Status 08/21/2022 FINAL  Final  Culture, Respiratory w Gram Stain     Status: None (Preliminary result)   Collection Time: 08/21/22  9:58 AM  Result Value Ref Range Status   Specimen Description   Final    EXPECTORATED SPUTUM Performed at [facility] [address] [place], [place] [address]    Special Requests   Final    NONE Reflexed from [id number] Performed at [facility], [place] [address], [place], [place] [address]    Gram Stain   Final    ABUNDANT WBC PRESENT, PREDOMINANTLY PMN NO ORGANISMS SEEN    Culture   Final    RARE Normal respiratory flora-no Staph aureus or Pseudomonas seen Performed at [facility] [address] [place],  [address]    Report Status PENDING  Incomplete     Labs: BNP (last 3 results) No results for input(s): "BNP" in the last 8760 hours. Basic Metabolic Panel: Recent Labs  Lab 08/17/22 1719 08/18/22 0523 08/19/22 0522 08/22/22 0511  NA 139 136 137 138  K 3.5 4.5 4.2 4.3  CL 109 107 108 108  CO2 '24 27 25 24  '$ GLUCOSE 134* 107* 101* 95  BUN '11 11 10 11  '$ CREATININE 0.66 0.51 0.55 0.64  CALCIUM 8.2* 8.1* 7.9* 8.2*  MG  --  2.1 2.1 2.2  PHOS  --  3.4 4.0 4.6   Liver Function Tests: Recent Labs  Lab 08/18/22 0523 08/19/22 0522 08/22/22 0511  AST '16 21  21  '$ ALT '13 15 16  '$ ALKPHOS 83 76 72  BILITOT 1.1 0.7 0.8  PROT 5.1* 5.2* 5.2*  ALBUMIN 3.0* 3.0* 2.8*   No results for input(s): "LIPASE", "AMYLASE" in the last 168  hours. No results for input(s): "AMMONIA" in the last 168 hours. CBC: Recent Labs  Lab 08/17/22 1719 08/18/22 0523 08/19/22 0522 08/20/22 0536 08/22/22 0511  WBC 1.3* 1.6* 1.9* 3.4* 2.3*  NEUTROABS 1.1* 0.8* 1.0* 2.5 1.0*  HGB 7.6* 6.4* 8.1* 9.3* 8.6*  HCT 21.6* 18.2* 22.5* 27.3* 25.5*  MCV 122.0* 127.3* 111.4* 116.2* 117.5*  PLT 71* 62* 79* 104* 123*   Cardiac Enzymes: No results for input(s): "CKTOTAL", "CKMB", "CKMBINDEX", "TROPONINI" in the last 168 hours. BNP: Invalid input(s): "POCBNP" CBG: No results for input(s): "GLUCAP" in the last 168 hours. D-Dimer No results for input(s): "DDIMER" in the last 72 hours. Hgb A1c No results for input(s): "HGBA1C" in the last 72 hours. Lipid Profile No results for input(s): "CHOL", "HDL", "LDLCALC", "TRIG", "CHOLHDL", "LDLDIRECT" in the last 72 hours. Thyroid function studies No results for input(s): "TSH", "T4TOTAL", "T3FREE", "THYROIDAB" in the last 72 hours.  Invalid input(s): "FREET3" Anemia work up No results for input(s): "VITAMINB12", "FOLATE", "FERRITIN", "TIBC", "IRON", "RETICCTPCT" in the last 72 hours. Urinalysis    Component Value Date/Time   COLORURINE AMBER (A) 08/17/2022 1752   APPEARANCEUR CLOUDY (A) 08/17/2022 1752   LABSPEC 1.016 08/17/2022 1752   PHURINE 6.0 08/17/2022 1752   GLUCOSEU NEGATIVE 08/17/2022 1752   HGBUR NEGATIVE 08/17/2022 1752   BILIRUBINUR NEGATIVE 08/17/2022 1752   KETONESUR 5 (A) 08/17/2022 1752   PROTEINUR 30 (A) 08/17/2022 1752   UROBILINOGEN 2.0 (H) 08/20/2021 1554   NITRITE NEGATIVE 08/17/2022 1752   LEUKOCYTESUR TRACE (A) 08/17/2022 1752   Sepsis Labs Recent Labs  Lab 08/18/22 0523 08/19/22 0522 08/20/22 0536 08/22/22 0511  WBC 1.6* 1.9* 3.4* 2.3*   Microbiology Recent Results (from the past 240 hour(s))   Blood culture (routine x 2)     Status: None   Collection Time: 08/17/22  4:45 PM   Specimen: BLOOD  Result Value Ref Range Status   Specimen Description   Final    BLOOD RIGHT ANTECUBITAL Performed at [facility], [place] [address], [place], [place] [address]    Special Requests   Final    BOTTLES DRAWN AEROBIC AND ANAEROBIC Blood Culture results may not be optimal due to an excessive volume of blood received in culture bottles Performed at [facility] [address], [place], [place] [address]    Culture   Final    NO GROWTH 5 DAYS Performed at [facility], [place] [address] [place], [place] [address]    Report Status 08/22/2022 FINAL  Final  Blood culture (routine x 2)     Status: None   Collection Time: 08/17/22  5:00 PM   Specimen: BLOOD  Result Value Ref Range Status   Specimen Description BLOOD SITE NOT SPECIFIED  Final   Special Requests   Final    BOTTLES DRAWN AEROBIC AND ANAEROBIC Blood Culture results may not be optimal due to an excessive volume of blood received in culture bottles   Culture   Final    NO GROWTH 5 DAYS Performed at [facility], [place] [address] [place], [place] [address]    Report Status 08/22/2022 FINAL  Final  Urine Culture     Status: Abnormal   Collection Time: 08/17/22  5:53 PM   Specimen: Urine, Clean Catch  Result Value Ref Range Status   Specimen Description   Final    URINE, CLEAN CATCH Performed at [facility], [place] [address] [place],  [address]  Special Requests   Final    NONE Performed at [facility], [place] [address] [place], [place] [address]    Culture >=100,000 COLONIES/mL ESCHERICHIA COLI (A)  Final   Report Status 08/20/2022 FINAL  Final   Organism ID, Bacteria ESCHERICHIA COLI (A)  Final      Susceptibility   Escherichia coli - MIC*    AMPICILLIN 16 INTERMEDIATE Intermediate     CEFAZOLIN <=4 SENSITIVE Sensitive      CEFEPIME <=0.12 SENSITIVE Sensitive     CEFTRIAXONE <=0.25 SENSITIVE Sensitive     CIPROFLOXACIN <=0.25 SENSITIVE Sensitive     GENTAMICIN <=1 SENSITIVE Sensitive     IMIPENEM <=0.25 SENSITIVE Sensitive     NITROFURANTOIN <=16 SENSITIVE Sensitive     TRIMETH/SULFA <=20 SENSITIVE Sensitive     AMPICILLIN/SULBACTAM 4 SENSITIVE Sensitive     PIP/TAZO <=4 SENSITIVE Sensitive     * >=100,000 COLONIES/mL ESCHERICHIA COLI  Resp Panel by RT-PCR (Flu A&B, Covid) Anterior Nasal Swab     Status: None   Collection Time: 08/17/22  8:34 PM   Specimen: Anterior Nasal Swab  Result Value Ref Range Status   SARS Coronavirus 2 by RT PCR NEGATIVE NEGATIVE Final    Comment: (NOTE) SARS-CoV-2 target nucleic acids are NOT DETECTED.  The SARS-CoV-2 RNA is generally detectable in upper respiratory specimens during the acute phase of infection. The lowest concentration of SARS-CoV-2 viral copies this assay can detect is 138 copies/mL. A negative result does not preclude SARS-Cov-2 infection and should not be used as the sole basis for treatment or other patient management decisions. A negative result may occur with  improper specimen collection/handling, submission of specimen other than nasopharyngeal swab, presence of viral mutation(s) within the areas targeted by this assay, and inadequate number of viral copies(<138 copies/mL). A negative result must be combined with clinical observations, patient history, and epidemiological information. The expected result is Negative.  Fact Sheet for Patients:  EntrepreneurPulse.com.au  Fact Sheet for Healthcare Providers:  IncredibleEmployment.be  This test is no t yet approved or cleared by the [place] FDA and  has been authorized for detection and/or diagnosis of SARS-CoV-2 by FDA under an Emergency Use Authorization (EUA). This EUA will remain  in effect (meaning this test can be used) for the duration of  the COVID-19 declaration under Section 564(b)(1) of the Act, 21 U.S.C.section 360bbb-3(b)(1), unless the authorization is terminated  or revoked sooner.       Influenza A by PCR NEGATIVE NEGATIVE Final   Influenza B by PCR NEGATIVE NEGATIVE Final    Comment: (NOTE) The Xpert Xpress SARS-CoV-2/FLU/RSV plus assay is intended as an aid in the diagnosis of influenza from Nasopharyngeal swab specimens and should not be used as a sole basis for treatment. Nasal washings and aspirates are unacceptable for Xpert Xpress SARS-CoV-2/FLU/RSV testing.  Fact Sheet for Patients: EntrepreneurPulse.com.au  Fact Sheet for Healthcare Providers: IncredibleEmployment.be  This test is not yet approved or cleared by the [place] FDA and has been authorized for detection and/or diagnosis of SARS-CoV-2 by FDA under an Emergency Use Authorization (EUA). This EUA will remain in effect (meaning this test can be used) for the duration of the COVID-19 declaration under Section 564(b)(1) of the Act, 21 U.S.C. section 360bbb-3(b)(1), unless the authorization is terminated or revoked.  Performed at [facility], [place] [address] [place], [place] [id number]   Expectorated Sputum Assessment w Gram Stain, Rflx to Resp Cult     Status: None  Collection Time: 08/21/22  9:58 AM   Specimen: Expectorated Sputum  Result Value Ref Range Status   Specimen Description EXPECTORATED SPUTUM  Final   Special Requests NONE  Final   Sputum evaluation   Final    THIS SPECIMEN IS ACCEPTABLE FOR SPUTUM CULTURE Performed at [facility], [place] [address] [place], [place] [address]    Report Status 08/21/2022 FINAL  Final  Culture, Respiratory w Gram Stain     Status: None (Preliminary result)   Collection Time: 08/21/22  9:58 AM  Result Value Ref Range Status   Specimen Description   Final    EXPECTORATED SPUTUM Performed at [facility] [address], [place], [place] [address]    Special Requests   Final    NONE Reflexed from [id number] Performed at [facility], [place] [address] [place], [place] [address]    Gram Stain   Final    ABUNDANT WBC PRESENT, PREDOMINANTLY PMN NO ORGANISMS SEEN    Culture   Final    RARE Normal respiratory flora-no Staph aureus or Pseudomonas seen Performed at [facility] [address], [place], [place] [address]    Report Status PENDING  Incomplete     Time coordinating discharge:  35 minutes  SIGNED:   Barb Merino, MD  [facility] 08/22/2022, 12:33 PM

## 2022-08-22 NOTE — TOC Transition Note (Addendum)
Transition of Care [facility]) - CM/SW Discharge Note   Patient Details  Name: Jordan [name] MRN: [id number] Date of Birth: Apr 03, 1966  Transition of Care [facility]) CM/SW Contact:  Vassie Moselle, [place] Phone Number: 08/22/2022, 3:08 PM   Clinical Narrative:    Spoke with pt regarding DME needs and home care needs. Pt shares she uses a rollator at home that her friend gave to her but, shares it is breaking down and she needs a new one. CSW attempted to order rollator for pt however, pt received rollator through her insurance in the past 5 years and is not eligible to receive a new one through her insurance at this time.   Pt also interested in home health PT/Aide/SW services. The following agencies have been contacted for home health services:  [facility]: Unable to accept Medicaid  [facility]- Unable to accept [facility]- Unable to accept Medicaid [facility]- Unable to accept [facility]- Unable to staff Medicaid  [facility]- Unable to accept  [facility]- Unable to accept Medicaid  [facility]- Unable to accept [facility]- Faxed referral, awaiting response   Update 4:20- Spoke with pt and shared barriers with obtaining DME and home health. Pt shares she will continue to use the rollator her friend gave to her. CSW added information for [facility] for pt to follow up regarding referral for services.    Final next level of care: Home/Self Care Barriers to Discharge: No Barriers Identified   Patient Goals and CMS Choice Patient states their goals for this hospitalization and ongoing recovery are:: To go home CMS Medicare.gov Compare Post Acute Care list provided to:: Patient Choice offered to / list presented to : Patient  Discharge Placement                       Discharge Plan and Services In-house Referral: NA Discharge Planning Services: NA Post Acute Care Choice: NA          DME Arranged: N/A DME Agency: NA                  Social Determinants of Health (SDOH)  Interventions Food Insecurity Interventions: Intervention Not Indicated Transportation Interventions: Intervention Not Indicated   Readmission Risk Interventions    08/22/2022    3:08 PM 08/19/2022   11:26 AM 04/11/2020   11:50 AM  Readmission Risk Prevention Plan  Post Dischage Appt  Complete Complete  Medication Screening  Complete Complete  Transportation Screening Complete Complete Complete  PCP or Specialist Appt within 5-7 Days Complete    Home Care Screening Complete    Medication Review (RN CM) Complete

## 2022-08-22 NOTE — Progress Notes (Signed)
AVS reviewed with the patient and she verbalized understanding, medication picked up from the pharmacy and given to patient, Jordan Sciara, RN witnessed medication return. IV line removed and patient was transported down to the Lobby for taxi transport home. Pt is stable

## 2022-08-23 ENCOUNTER — Telehealth: Payer: Self-pay | Admitting: Oncology

## 2022-08-23 ENCOUNTER — Encounter: Payer: Self-pay | Admitting: Hematology and Oncology

## 2022-08-23 NOTE — Telephone Encounter (Signed)
Jordan [name] called and said she was discharged from the hospital yesterday.  She picked up her prescription for a B12 injections but was not given any needles.  She is asking if she need to give herself the injections and how to get a needle.  [place] to give a verbal order for the needle per Dr. Alvy Bimler.  Per the pharmacist, they do not need a prescription because it is not covered by insurance and the co-pay is more than the needle would be by itself.  Called Jordan [name] back and advised that she does need to give herself the injection once a week.  She had an injection yesterday in the hospital so discussed when she should give herself the next injection next week.  Also advised that she can pick up the needle at the [facility] and that it will be $0.50.  She verbalized understanding and agreement of instructions in teach back mode.  She is going to pick up the needle tomorrow when she can take the [facility] bus.  Also reviewed her upcoming appointments on 09/03/22 and will send her a printed calendar.

## 2022-08-27 ENCOUNTER — Other Ambulatory Visit: Payer: Self-pay

## 2022-08-27 ENCOUNTER — Telehealth: Payer: Self-pay

## 2022-08-27 DIAGNOSIS — E538 Deficiency of other specified B group vitamins: Secondary | ICD-10-CM

## 2022-08-27 NOTE — Telephone Encounter (Addendum)
She left x 2 messages to call her. Then she called the infusion room and was transferred to the office. She is able to eat and drink with no problems. She is in the bed today and complaining of being lightheaded/ and dizzy. She said I feel the same way as when I went to hospital. Next appt at [facility] 11/14.  She has not started B12 injections and has not picked up needle from pharmacy due to no transportation.  Instructed to go the the ER for the worsening symptoms. She does not want to go to the ER. She will have to get transportation if appt needed. Told her that I would speak with Dr. Elson Areas and call her back. She verbalized understanding.

## 2022-08-27 NOTE — Telephone Encounter (Signed)
She has been dizzy in the hospital I recommend she drinks and track oral intake 90 ounces per day I do not have availability to see her today unless [facility] can see her today or tomorrow

## 2022-08-27 NOTE — Telephone Encounter (Signed)
Called back and given below message. She verbalized understanding. She will try to drink more fluids. She drinks mostly sodas and will try to add water. She is insisting on appt tomorrow. Given appt time for lab at 1230 tomorrow and at 1 pm she will see Anda Kraft in [facility]. She verbalized understanding.

## 2022-08-27 NOTE — Telephone Encounter (Signed)
Received a transferred message that she need a call from the office. Called and left a message asking her to call the office back if she needed assistance.

## 2022-08-28 ENCOUNTER — Inpatient Hospital Stay (HOSPITAL_BASED_OUTPATIENT_CLINIC_OR_DEPARTMENT_OTHER): Payer: Medicaid Other | Admitting: Physician Assistant

## 2022-08-28 ENCOUNTER — Inpatient Hospital Stay: Payer: Medicaid Other

## 2022-08-28 ENCOUNTER — Inpatient Hospital Stay: Payer: Medicaid Other | Attending: Radiation Oncology

## 2022-08-28 ENCOUNTER — Ambulatory Visit: Payer: Medicaid Other

## 2022-08-28 ENCOUNTER — Other Ambulatory Visit: Payer: Self-pay

## 2022-08-28 VITALS — BP 122/64 | HR 79 | Resp 16

## 2022-08-28 VITALS — BP 106/76 | HR 80 | Temp 97.9°F | Resp 16 | Wt 220.0 lb

## 2022-08-28 DIAGNOSIS — Z8544 Personal history of malignant neoplasm of other female genital organs: Secondary | ICD-10-CM | POA: Insufficient documentation

## 2022-08-28 DIAGNOSIS — C52 Malignant neoplasm of vagina: Secondary | ICD-10-CM

## 2022-08-28 DIAGNOSIS — E538 Deficiency of other specified B group vitamins: Secondary | ICD-10-CM | POA: Diagnosis not present

## 2022-08-28 DIAGNOSIS — E860 Dehydration: Secondary | ICD-10-CM | POA: Diagnosis present

## 2022-08-28 DIAGNOSIS — I959 Hypotension, unspecified: Secondary | ICD-10-CM

## 2022-08-28 DIAGNOSIS — D649 Anemia, unspecified: Secondary | ICD-10-CM | POA: Diagnosis present

## 2022-08-28 DIAGNOSIS — R300 Dysuria: Secondary | ICD-10-CM | POA: Diagnosis not present

## 2022-08-28 DIAGNOSIS — R638 Other symptoms and signs concerning food and fluid intake: Secondary | ICD-10-CM | POA: Diagnosis not present

## 2022-08-28 DIAGNOSIS — R42 Dizziness and giddiness: Secondary | ICD-10-CM

## 2022-08-28 LAB — CBC WITH DIFFERENTIAL (CANCER CENTER ONLY)
Abs Immature Granulocytes: 0.02 10*3/uL (ref 0.00–0.07)
Basophils Absolute: 0 10*3/uL (ref 0.0–0.1)
Basophils Relative: 0 %
Eosinophils Absolute: 0 10*3/uL (ref 0.0–0.5)
Eosinophils Relative: 0 %
HCT: 34.4 % — ABNORMAL LOW (ref 36.0–46.0)
Hemoglobin: 11.5 g/dL — ABNORMAL LOW (ref 12.0–15.0)
Immature Granulocytes: 0 %
Lymphocytes Relative: 16 %
Lymphs Abs: 0.8 10*3/uL (ref 0.7–4.0)
MCH: 37.6 pg — ABNORMAL HIGH (ref 26.0–34.0)
MCHC: 33.4 g/dL (ref 30.0–36.0)
MCV: 112.4 fL — ABNORMAL HIGH (ref 80.0–100.0)
Monocytes Absolute: 0.5 10*3/uL (ref 0.1–1.0)
Monocytes Relative: 9 %
Neutro Abs: 3.8 10*3/uL (ref 1.7–7.7)
Neutrophils Relative %: 75 %
Platelet Count: 286 10*3/uL (ref 150–400)
RBC: 3.06 MIL/uL — ABNORMAL LOW (ref 3.87–5.11)
RDW: 17.9 % — ABNORMAL HIGH (ref 11.5–15.5)
WBC Count: 5.1 10*3/uL (ref 4.0–10.5)
nRBC: 0 % (ref 0.0–0.2)

## 2022-08-28 LAB — CMP (CANCER CENTER ONLY)
ALT: 17 U/L (ref 0–44)
AST: 27 U/L (ref 15–41)
Albumin: 3.8 g/dL (ref 3.5–5.0)
Alkaline Phosphatase: 99 U/L (ref 38–126)
Anion gap: 4 — ABNORMAL LOW (ref 5–15)
BUN: 7 mg/dL (ref 6–20)
CO2: 28 mmol/L (ref 22–32)
Calcium: 8.8 mg/dL — ABNORMAL LOW (ref 8.9–10.3)
Chloride: 108 mmol/L (ref 98–111)
Creatinine: 0.55 mg/dL (ref 0.44–1.00)
GFR, Estimated: >60 mL/min (ref >60)
Glucose, Bld: 91 mg/dL (ref 70–99)
Potassium: 4.1 mmol/L (ref 3.5–5.1)
Sodium: 140 mmol/L (ref 135–145)
Total Bilirubin: 1.9 mg/dL — ABNORMAL HIGH (ref 0.3–1.2)
Total Protein: 6.2 g/dL — ABNORMAL LOW (ref 6.5–8.1)

## 2022-08-28 LAB — SAMPLE TO BLOOD BANK

## 2022-08-28 MED ORDER — SODIUM CHLORIDE 0.9 % IV SOLN: Freq: Once | INTRAVENOUS | Status: AC

## 2022-08-28 NOTE — Progress Notes (Signed)
Symptom Management Consult note [place]    Patient Care Team: [name], NP as PCP - General (Nurse Practitioner)    Name of the patient: Jordan [name]  [id number]  Mar 19, 1966   Date of visit: 08/28/2022   Chief Complaint/Reason for visit: dizziness   Current Therapy: B12 injections    ASSESSMENT & PLAN: Patient is a 56 y.o. 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 Alvy Bimler.  I have viewed most recent oncology note and lab work.    #) B12 deficiency and anemia -Scheduled for appointment with Dr. Alvy Bimler 11/14 for B12 injection and blood transfusion if necessary. -CBC with hemoglobin 11.6 today, no transfusion needed -Recommenced iron rich foods for patient to try incorporating into diet.  #)Dizziness -Acute on chronic per chart review. -Orthostatic vitals positive with increased HR 30 bpm. Patient given liter of IVF here in clinic as she clinically appears dehydrated. -normal neuro exam. CMP overall unremarkable. -Suspect dizziness worsened with her low fluid intake. After increasing from 20 to 40 oz of fluid today the dizziness has improved.   Strict ED precautions discussed should symptoms worsen.     Heme/Onc History: Oncology History  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[place])  05/28/2019 Initial Diagnosis   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HCC), Stage IA   06/19/2019 Cancer Staging   staging CT scan of the chest abdomen and pelvis.  This revealed that she was status post hysterectomy with no evidence of metastatic disease in the abdomen or pelvis or chest.   06/26/2019 Imaging   MRI of the pelvis with IV contrast: susceptibility artifact in the vaginal cuff from suture material limiting evaluation.  However no focal soft tissue masses were seen involving the vaginal cuff and no abnormal soft tissue densities in the parametrial  regions.  The right and left ovaries were grossly normal.   07/22/2019 Surgery   Robotic assisted radical upper vaginectomy with bilateral pelvic sentinel lymph node biopsy, right salpingo-oophorectomy, left salpingectomy.  Intraoperative findings were significant for no grossly visible vaginal lesions.  The ovaries and fallopian tubes were adherent to the rectum and vaginal cuff in the obliterated cul-de-sac.  The ovaries themselves are grossly normal-appearing but densely adherent with no suspicious lymph nodes.  There is clinical stage I disease, microscopic.   07/22/2019 Cancer Staging   Postoperatively final pathology returned as FIGO stage Ia microscopic poorly differentiated squamous cell carcinoma.  The tumor site was the posterior vaginal wall and measured 0.6 cm.  The posterior margin (the posterior vaginal cuff margin) was broadly positive for tumor cells.  The deep margin was negative.  There was 2.5 mm depth of invasion.  All sentinel lymph nodes were negative for metastases, as was the left fallopian tube and right tube and ovary.   08/24/2019 - 09/22/2019 Radiation Therapy   Vaginal brachytherapy   09/15/2020 PET scan   Soft tissue attenuating structure is noted within the left pelvis measuring 1.7 cm. This was favored to be a left ovary. No clear evidence for recurrence.    01/01/2021 Imaging   Pelvic MRI 1. No evidence of local tumor recurrence at the vaginal resection margin. 2. No evidence of metastatic disease in the pelvis. 3. Left adnexal 2.3 x 1.6 x 1.6 cm structure is compatible with an atrophic left ovary, unchanged from 08/31/2020 outside CT. 4. Chronic mild diffuse bladder wall  thickening, unchanged, potentially the sequela of prior radiation therapy       Interval history-: Jordan [name] is a 56 y.o. female with oncologic history as above presenting to [facility] today with chief complaint of dizziness x several years. Patient is unaccompanied to clinic visit today.   Patient reports her dizziness has been ongoing for awhile. It is worse when she changes positions. She has not had any falls. She admits to poor fluid intake, usually drinking 20 oz water/day. She was advised to increase her fluid intake yesterday by RN and drank 40 oz water. Today she admits her dizziness is not as bad. She denies any headache or visual changes. She did not take any OTC medications PTA. She admits her dysuria has improved after finishing the keflex x 3 days ago. No current urinary symptoms. Chart review shows patient had recent admission 10/28-11/02 for sepsis 2/2 complicated UT, severe pancytopenia and B12 deficiency.   ROS  All other systems are reviewed and are negative for acute change except as noted in the HPI.    Allergies  Allergen Reactions   Ciprofloxacin Swelling    Lips swell, tongue swells, face swells   Citalopram Other (See Comments)    Possible cause of pancytopenia. Swelling of tongue, face and throat   Lamotrigine Other (See Comments)    Possible cause of pancytopenia. Swelling of face, throat and tongue   Sulfa Antibiotics Anaphylaxis   Tramadol Anaphylaxis, Shortness Of Breath and Swelling     Past Medical History:  Diagnosis Date   Alcohol abuse    Anemia    patient denies   Bipolar 1 disorder ([place])    No medications currently   CAP (community acquired pneumonia) 03/17/2015   History of radiation therapy 08/24/19-09/22/19   Vaginal brachytherapy   Dr. 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   Mental disorder    Obesity    PICC line infection 05/17/2015   Sepsis due to Gram negative bacteria (MDR E Coli) 02/18/2015   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HCC)    UTI (lower urinary tract infection)    Vaginal Pap smear, abnormal      Past Surgical History:  Procedure Laterality Date   ABDOMINAL HYSTERECTOMY     CERVICAL CONIZATION W/BX N/A 07/01/2016   Procedure: CONIZATION CERVIX WITH BIOPSY;  Surgeon:  Chancy Milroy, MD;  Location: [facility];  Service: Gynecology;  Laterality: N/A;   CHOLECYSTECTOMY     EXTERNAL FIXATION LEG Right 04/09/2020   Procedure: EXTERNAL FIXATION LEG;  Surgeon: Hiram Gash, MD;  Location: [facility];  Service: Orthopedics;  Laterality: Right;   HYSTEROSCOPY WITH D & C N/A 01/25/2016   Procedure: DILATATION AND CURETTAGE /HYSTEROSCOPY;  Surgeon: Mora Bellman, MD;  Location: [facility];  Service: Gynecology;  Laterality: N/A;   LYMPH NODE BIOPSY Bilateral 07/22/2019   Procedure: LYMPH NODE BIOPSY;  Surgeon: Everitt Amber, MD;  Location: [facility];  Service: Gynecology;  Laterality: Bilateral;   OPEN REDUCTION INTERNAL FIXATION (ORIF) TIBIA/FIBULA FRACTURE Right 04/10/2020   Procedure: OPEN REDUCTION INTERNAL FIXATION (ORIF) TIBIA/FIBULA FRACTURE;  Surgeon: Shona Needles, MD;  Location: [facility];  Service: Orthopedics;  Laterality: Right;   ROBOT ASSISTED MYOMECTOMY N/A 07/22/2019   Procedure: XI ROBOTIC ASSISTED LAPAROSCOPIC RADICAL UPPER VAGINECTOMY, LEFT SALPINECTOMY, RIGHT SALPINGOOOPHERECTOMY;  Surgeon: Everitt Amber, MD;  Location: [facility];  Service: Gynecology;  Laterality: N/A;   VAGINAL HYSTERECTOMY N/A 03/11/2017   Procedure: HYSTERECTOMY VAGINAL WITH MORCELLATION;  Surgeon:  Chancy Milroy, MD;  Location: [facility];  Service: Gynecology;  Laterality: N/A;    Social History   Socioeconomic History   Marital status: Soil scientist    Spouse name: Not on file   Number of children: Not on file   Years of education: Not on file   Highest education level: Not on file  Occupational History   Not on file  Tobacco Use   Smoking status: Never   Smokeless tobacco: Never  Vaping Use   Vaping Use: Never used  Substance and Sexual Activity   Alcohol use: Not Currently   Drug use: No   Sexual activity: Yes    Birth control/protection: Post-menopausal, Surgical    Comment: perimenopausal; no sex in years  Other Topics Concern   Not on file  Social History Narrative   Not on file    Social Determinants of Health   Financial Resource Strain: Not on file  Food Insecurity: No Food Insecurity (08/19/2022)   Hunger Vital Sign    Worried About Running Out of Food in the Last Year: Never true    Ran Out of Food in the Last Year: Never true  Recent Concern: Food Insecurity - Food Insecurity Present (08/18/2022)   Hunger Vital Sign    Worried About Running Out of Food in the Last Year: Sometimes true    Ran Out of Food in the Last Year: Sometimes true  Transportation Needs: No Transportation Needs (08/19/2022)   PRAPARE - Hydrologist (Medical): No    Lack of Transportation (Non-Medical): No  Recent Concern: Transportation Needs - Unmet Transportation Needs (08/18/2022)   PRAPARE - Hydrologist (Medical): Yes    Lack of Transportation (Non-Medical): Yes  Physical Activity: Not on file  Stress: Not on file  Social Connections: Not on file  Intimate Partner Violence: Not At Risk (08/18/2022)   Humiliation, Afraid, Rape, and Kick questionnaire    Fear of Current or Ex-Partner: No    Emotionally Abused: No    Physically Abused: No    Sexually Abused: No    Family History  Problem Relation Age of Onset   Alcohol abuse Father    Cancer Father    Breast cancer Neg Hx      Current Outpatient Medications:    cyanocobalamin (VITAMIN B12) 1000 MCG tablet, Take 1 tablet (1,000 mcg total) by mouth daily., Disp: 30 tablet, Rfl: 0   cyanocobalamin (VITAMIN B12) 1000 MCG/ML injection, Inject 1 mL (1,000 mcg total) into the muscle every 7 (seven) days., Disp: 1 mL, Rfl: 0   estradiol (ESTRACE) 0.1 MG/GM vaginal cream, Place 1 Applicatorful vaginally at bedtime., Disp: 63.1 g, Rfl: 12   folic acid (FOLVITE) 1 MG tablet, Take 1 tablet (1 mg total) by mouth daily., Disp: 30 tablet, Rfl: 0   gabapentin (NEURONTIN) 800 MG tablet, Take 800 mg by mouth 3 (three) times daily., Disp: , Rfl:    Oxycodone HCl 10 MG TABS, Take 10  mg by mouth daily as needed., Disp: , Rfl:   PHYSICAL EXAM: ECOG FS:1 - Symptomatic but completely ambulatory    Vitals:   08/28/22 1318  BP: 106/76  Pulse: 80  Resp: 16  Temp: 97.9 F (36.6 C)  TempSrc: Oral  SpO2: 99%  Weight: 220 lb (99.8 kg)   Physical Exam Vitals and nursing note reviewed.  Constitutional:      Appearance: She is well-developed. She is not ill-appearing or toxic-appearing.  HENT:     Head: Normocephalic.     Nose: Nose normal.     Mouth/Throat:     Mouth: Mucous membranes are dry.  Eyes:     Conjunctiva/sclera: Conjunctivae normal.  Neck:     Vascular: No JVD.  Cardiovascular:     Rate and Rhythm: Normal rate and regular rhythm.     Pulses: Normal pulses.     Heart sounds: Normal heart sounds.  Pulmonary:     Effort: Pulmonary effort is normal.     Breath sounds: Normal breath sounds.  Abdominal:     General: There is no distension.     Palpations: Abdomen is soft. There is no mass.     Tenderness: There is no abdominal tenderness. There is no guarding or rebound.     Hernia: No hernia is present.  Musculoskeletal:     Cervical back: Normal range of motion.  Skin:    General: Skin is warm and dry.  Neurological:     Mental Status: She is oriented to person, place, and time.     Comments: Speech is clear and goal oriented, follows commands CN III-XII intact, no facial droop Normal strength in upper and lower extremities bilaterally including dorsiflexion and plantar flexion, strong and equal grip strength Sensation normal to light and sharp touch Moves extremities without ataxia, coordination intact Normal finger to nose and rapid alternating movements Normal gait and balance  Psychiatric:        Mood and Affect: Mood is anxious.        LABORATORY DATA: I have reviewed the data as listed    Latest Ref Rng & Units 08/28/2022   12:53 PM 08/22/2022    5:11 AM 08/20/2022    5:36 AM  CBC  WBC 4.0 - 10.5 K/uL 5.1  2.3  3.4    Hemoglobin 12.0 - 15.0 g/dL 11.5  8.6  9.3   Hematocrit 36.0 - 46.0 % 34.4  25.5  27.3   Platelets 150 - 400 K/uL 286  123  104         Latest Ref Rng & Units 08/28/2022   12:53 PM 08/22/2022    5:11 AM 08/19/2022    5:22 AM  CMP  Glucose 70 - 99 mg/dL 91  95  101   BUN 6 - 20 mg/dL '7  11  10   '$ Creatinine 0.44 - 1.00 mg/dL 0.55  0.64  0.55   Sodium 135 - 145 mmol/L 140  138  137   Potassium 3.5 - 5.1 mmol/L 4.1  4.3  4.2   Chloride 98 - 111 mmol/L 108  108  108   CO2 22 - 32 mmol/L '28  24  25   '$ Calcium 8.9 - 10.3 mg/dL 8.8  8.2  7.9   Total Protein 6.5 - 8.1 g/dL 6.2  5.2  5.2   Total Bilirubin 0.3 - 1.2 mg/dL 1.9  0.8  0.7   Alkaline Phos 38 - 126 U/L 99  72  76   AST 15 - 41 U/L '27  21  21   '$ ALT 0 - 44 U/L '17  16  15        '$ RADIOGRAPHIC STUDIES (from last 24 hours if applicable) I have personally reviewed the radiological images as listed and agreed with the findings in the report. No results found.      Visit Diagnosis: 1. Dizziness   2. Vitamin B12 deficiency   3. Poor fluid intake   4.  Anemia, unspecified type      No orders of the defined types were placed in this encounter.   All questions were answered. The patient knows to call the clinic with any problems, questions or concerns. No barriers to learning was detected.  I have spent a total of 20 minutes minutes of face-to-face and non-face-to-face time, preparing to see the patient, obtaining and/or reviewing separately obtained history, performing a medically appropriate examination, counseling and educating the patient, ordering tests, documenting clinical information in the electronic health record, and care coordination (communications with other health care professionals or caregivers).    Thank you for allowing me to participate in the care of this patient.    Barrie Folk, PA-C Department of Hematology/Oncology [facility] at [facility] Phone: [phone]   [phone]    08/28/2022 2:38 PM

## 2022-08-28 NOTE — Progress Notes (Signed)
Orthostatic vital signs obtained per Sherol Dade, PA, verbal order.

## 2022-08-28 NOTE — Patient Instructions (Signed)

## 2022-08-30 ENCOUNTER — Telehealth: Payer: Self-pay

## 2022-08-30 ENCOUNTER — Inpatient Hospital Stay (HOSPITAL_BASED_OUTPATIENT_CLINIC_OR_DEPARTMENT_OTHER): Admission: RE | Admit: 2022-08-30 | Payer: Medicaid Other | Source: Ambulatory Visit | Admitting: Radiology

## 2022-08-30 NOTE — Telephone Encounter (Signed)
Returned her call and left a message to call the office back. She left a message that she is dizzy and light headed.

## 2022-09-03 ENCOUNTER — Encounter: Payer: Self-pay | Admitting: Hematology and Oncology

## 2022-09-03 ENCOUNTER — Inpatient Hospital Stay: Payer: Medicaid Other

## 2022-09-03 ENCOUNTER — Inpatient Hospital Stay (HOSPITAL_BASED_OUTPATIENT_CLINIC_OR_DEPARTMENT_OTHER): Payer: Medicaid Other | Admitting: Hematology and Oncology

## 2022-09-03 ENCOUNTER — Other Ambulatory Visit: Payer: Self-pay

## 2022-09-03 VITALS — BP 106/91 | HR 79 | Temp 98.5°F | Resp 19 | Ht 68.0 in | Wt 215.0 lb

## 2022-09-03 DIAGNOSIS — E538 Deficiency of other specified B group vitamins: Secondary | ICD-10-CM

## 2022-09-03 DIAGNOSIS — D649 Anemia, unspecified: Secondary | ICD-10-CM | POA: Diagnosis not present

## 2022-09-03 DIAGNOSIS — Z859 Personal history of malignant neoplasm, unspecified: Secondary | ICD-10-CM

## 2022-09-03 DIAGNOSIS — R42 Dizziness and giddiness: Secondary | ICD-10-CM | POA: Diagnosis not present

## 2022-09-03 LAB — CBC WITH DIFFERENTIAL/PLATELET
Abs Immature Granulocytes: 0.01 10*3/uL (ref 0.00–0.07)
Basophils Absolute: 0 10*3/uL (ref 0.0–0.1)
Basophils Relative: 0 %
Eosinophils Absolute: 0 10*3/uL (ref 0.0–0.5)
Eosinophils Relative: 0 %
HCT: 32.8 % — ABNORMAL LOW (ref 36.0–46.0)
Hemoglobin: 11.1 g/dL — ABNORMAL LOW (ref 12.0–15.0)
Immature Granulocytes: 0 %
Lymphocytes Relative: 24 %
Lymphs Abs: 1.1 10*3/uL (ref 0.7–4.0)
MCH: 37.6 pg — ABNORMAL HIGH (ref 26.0–34.0)
MCHC: 33.8 g/dL (ref 30.0–36.0)
MCV: 111.2 fL — ABNORMAL HIGH (ref 80.0–100.0)
Monocytes Absolute: 0.4 10*3/uL (ref 0.1–1.0)
Monocytes Relative: 8 %
Neutro Abs: 3.1 10*3/uL (ref 1.7–7.7)
Neutrophils Relative %: 68 %
Platelets: 203 10*3/uL (ref 150–400)
RBC: 2.95 MIL/uL — ABNORMAL LOW (ref 3.87–5.11)
RDW: 13.9 % (ref 11.5–15.5)
WBC: 4.6 10*3/uL (ref 4.0–10.5)
nRBC: 0 % (ref 0.0–0.2)

## 2022-09-03 LAB — SAMPLE TO BLOOD BANK

## 2022-09-03 MED ORDER — CYANOCOBALAMIN 1000 MCG/ML IJ SOLN
1000.0000 ug | Freq: Once | INTRAMUSCULAR | Status: AC
  Administered 2022-09-03: 1000 ug via INTRAMUSCULAR
  Filled 2022-09-03: qty 1

## 2022-09-03 NOTE — Assessment & Plan Note (Signed)
She has chronic dizziness since discharge from the hospital I suspect she might have mild neuropathy from severe vitamin B12 deficiency She is recommended adequate hydration

## 2022-09-03 NOTE — Assessment & Plan Note (Signed)
She has marked improvement of her blood counts with regular dosing of vitamin B12 She was not able to get her supplies for self administration of vitamin B12 We discussed risk and benefits of coming here for treatment versus doing it at home Ultimately, the patient decides to return him monthly for injection I plan to recheck her vitamin B12 level in 3 months

## 2022-09-03 NOTE — Patient Instructions (Signed)

## 2022-09-03 NOTE — Progress Notes (Signed)
[place] OFFICE PROGRESS NOTE  Patient Care Team: [name], NP as PCP - General (Nurse Practitioner)  ASSESSMENT & PLAN:  Vitamin B12 deficiency She has marked improvement of her blood counts with regular dosing of vitamin B12 She was not able to get her supplies for self administration of vitamin B12 We discussed risk and benefits of coming here for treatment versus doing it at home Ultimately, the patient decides to return him monthly for injection I plan to recheck her vitamin B12 level in 3 months  Dizziness She has chronic dizziness since discharge from the hospital I suspect she might have mild neuropathy from severe vitamin B12 deficiency She is recommended adequate hydration  History of cancer She has history of vaginal cancer, treated She has appointment scheduled to see GYN oncologist next month  Orders Placed This Encounter  Procedures   Vitamin B12    Standing Status:   Standing    Number of Occurrences:   2    Standing Expiration Date:   09/04/2023    All questions were answered. The patient knows to call the clinic with any problems, questions or concerns. The total time spent in the appointment was 20 minutes encounter with patients including review of chart and various tests results, discussions about plan of care and coordination of care plan   Heath Lark, MD 09/03/2022 12:51 PM  INTERVAL HISTORY: Please see below for problem oriented charting. she returns for treatment follow-up due to recent findings of severe vitamin B12 deficiency She was not able to give herself vitamin B12 at home She continues to complain of chronic dizziness  REVIEW OF SYSTEMS:   Constitutional: Denies fevers, chills or abnormal weight loss Eyes: Denies blurriness of vision Ears, nose, mouth, throat, and face: Denies mucositis or sore throat Respiratory: Denies cough, dyspnea or wheezes Cardiovascular: Denies palpitation, chest discomfort or lower  extremity swelling Gastrointestinal:  Denies nausea, heartburn or change in bowel habits Skin: Denies abnormal skin rashes Lymphatics: Denies new lymphadenopathy or easy bruising Neurological:Denies numbness, tingling or new weaknesses Behavioral/Psych: Mood is stable, no new changes  All other systems were reviewed with the patient and are negative.  I have reviewed the past medical history, past surgical history, social history and family history with the patient and they are unchanged from previous note.  ALLERGIES:  is allergic to ciprofloxacin, citalopram, lamotrigine, sulfa antibiotics, and tramadol.  MEDICATIONS:  Current Outpatient Medications  Medication Sig Dispense Refill   cyanocobalamin (VITAMIN B12) 1000 MCG tablet Take 1 tablet (1,000 mcg total) by mouth daily. 30 tablet 0   cyanocobalamin (VITAMIN B12) 1000 MCG/ML injection Inject 1 mL (1,000 mcg total) into the muscle every 7 (seven) days. 1 mL 0   estradiol (ESTRACE) 0.1 MG/GM vaginal cream Place 1 Applicatorful vaginally at bedtime. 75.1 g 12   folic acid (FOLVITE) 1 MG tablet Take 1 tablet (1 mg total) by mouth daily. 30 tablet 0   gabapentin (NEURONTIN) 800 MG tablet Take 800 mg by mouth 3 (three) times daily.     Oxycodone HCl 10 MG TABS Take 10 mg by mouth daily as needed.     No current facility-administered medications for this visit.    SUMMARY OF ONCOLOGIC HISTORY: Oncology History  History of cancer  05/28/2019 Initial Diagnosis   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HCC), Stage IA   06/19/2019 Cancer Staging   staging CT scan of the chest abdomen and pelvis.  This revealed that she was status post hysterectomy with  no evidence of metastatic disease in the abdomen or pelvis or chest.   06/26/2019 Imaging   MRI of the pelvis with IV contrast: susceptibility artifact in the vaginal cuff from suture material limiting evaluation.  However no focal soft tissue masses were seen involving the vaginal cuff and no  abnormal soft tissue densities in the parametrial regions.  The right and left ovaries were grossly normal.   07/22/2019 Surgery   Robotic assisted radical upper vaginectomy with bilateral pelvic sentinel lymph node biopsy, right salpingo-oophorectomy, left salpingectomy.  Intraoperative findings were significant for no grossly visible vaginal lesions.  The ovaries and fallopian tubes were adherent to the rectum and vaginal cuff in the obliterated cul-de-sac.  The ovaries themselves are grossly normal-appearing but densely adherent with no suspicious lymph nodes.  There is clinical stage I disease, microscopic.   07/22/2019 Cancer Staging   Postoperatively final pathology returned as FIGO stage Ia microscopic poorly differentiated squamous cell carcinoma.  The tumor site was the posterior vaginal wall and measured 0.6 cm.  The posterior margin (the posterior vaginal cuff margin) was broadly positive for tumor cells.  The deep margin was negative.  There was 2.5 mm depth of invasion.  All sentinel lymph nodes were negative for metastases, as was the left fallopian tube and right tube and ovary.   08/24/2019 - 09/22/2019 Radiation Therapy   Vaginal brachytherapy   09/15/2020 PET scan   Soft tissue attenuating structure is noted within the left pelvis measuring 1.7 cm. This was favored to be a left ovary. No clear evidence for recurrence.    01/01/2021 Imaging   Pelvic MRI 1. No evidence of local tumor recurrence at the vaginal resection margin. 2. No evidence of metastatic disease in the pelvis. 3. Left adnexal 2.3 x 1.6 x 1.6 cm structure is compatible with an atrophic left ovary, unchanged from 08/31/2020 outside CT. 4. Chronic mild diffuse bladder wall thickening, unchanged, potentially the sequela of prior radiation therapy     PHYSICAL EXAMINATION: ECOG PERFORMANCE STATUS: 1 - Symptomatic but completely ambulatory  Vitals:   09/03/22 1110  BP: (!) 106/91  Pulse: 79  Resp: 19  Temp:  98.5 F (36.9 C)  SpO2: 99%   Filed Weights   09/03/22 1110  Weight: 215 lb (97.5 kg)    GENERAL:alert, no distress and comfortable  NEURO: alert & oriented x 3 with fluent speech, no focal motor/sensory deficits  LABORATORY DATA:  I have reviewed the data as listed    Component Value Date/Time   NA 140 08/28/2022 1253   K 4.1 08/28/2022 1253   CL 108 08/28/2022 1253   CO2 28 08/28/2022 1253   GLUCOSE 91 08/28/2022 1253   BUN 7 08/28/2022 1253   CREATININE 0.55 08/28/2022 1253   CALCIUM 8.8 (L) 08/28/2022 1253   PROT 6.2 (L) 08/28/2022 1253   ALBUMIN 3.8 08/28/2022 1253   AST 27 08/28/2022 1253   ALT 17 08/28/2022 1253   ALKPHOS 99 08/28/2022 1253   BILITOT 1.9 (H) 08/28/2022 1253   GFRNONAA >60 08/28/2022 1253   GFRAA >60 05/03/2020 0707    No results found for: "SPEP", "UPEP"  Lab Results  Component Value Date   WBC 4.6 09/03/2022   NEUTROABS 3.1 09/03/2022   HGB 11.1 (L) 09/03/2022   HCT 32.8 (L) 09/03/2022   MCV 111.2 (H) 09/03/2022   PLT 203 09/03/2022      Chemistry      Component Value Date/Time   NA 140 08/28/2022 1253  K 4.1 08/28/2022 1253   CL 108 08/28/2022 1253   CO2 28 08/28/2022 1253   BUN 7 08/28/2022 1253   CREATININE 0.55 08/28/2022 1253      Component Value Date/Time   CALCIUM 8.8 (L) 08/28/2022 1253   ALKPHOS 99 08/28/2022 1253   AST 27 08/28/2022 1253   ALT 17 08/28/2022 1253   BILITOT 1.9 (H) 08/28/2022 1253

## 2022-09-03 NOTE — Assessment & Plan Note (Signed)
She has history of vaginal cancer, treated She has appointment scheduled to see GYN oncologist next month

## 2022-09-04 ENCOUNTER — Telehealth: Payer: Self-pay | Admitting: Oncology

## 2022-09-04 ENCOUNTER — Ambulatory Visit: Payer: Medicaid Other | Admitting: Clinical

## 2022-09-04 DIAGNOSIS — F4321 Adjustment disorder with depressed mood: Secondary | ICD-10-CM

## 2022-09-04 NOTE — Telephone Encounter (Signed)
Jordan [name] called and wanted to see if her radiation treatments caused her UTI's to be worse.  Advised her that radiation may have caused irritation/dysuria during treatment but would not make her UTI's worse now.  She verbalized understanding and confirmed her next appointment on 03/10/23 with Dr. Sondra Come.  Also mailed her a calendar of her appointments per her request.

## 2022-09-04 NOTE — BH Specialist Note (Signed)
Integrated Behavioral Health via Telemedicine Visit  09/04/2022 [place] C[place] [id number]  Number of Integrated Behavioral Health Clinician visits: 2- Second Visit  Session Start time: 0310   Session End time: 0425  Total time in minutes: 75  I reviewed patient visit with the [facility] Intern, and I concur with the treatment plan, as documented in the [facility] Intern note.   No charge for this visit due to [facility] Intern seeing patient.  Vesta Mixer, MSW, [place] for [facility]   Referring Provider: Dr.Michael Ervin Patient/Family location: Pt is home  [facility] Provider location: Pharmacist, hospital for [facility]  All persons participating in visit: Pt and [facility] Intern  Types of Service: Individual psychotherapy  I connected with [name] ia  Telephone or Video Enabled Telemedicine Application  (Video is Caregility application) and verified that I am speaking with the correct person using two identifiers. Discussed confidentiality: Yes   I discussed the limitations of telemedicine and the availability of in person appointments.  Discussed there is a possibility of technology failure and discussed alternative modes of communication if that failure occurs.  I discussed that engaging in this telemedicine visit, they consent to the provision of behavioral healthcare and the services will be billed under their insurance.  Patient and/or legal guardian expressed understanding and consented to Telemedicine visit: Yes   Presenting Concerns: Patient and/or family reports the following symptoms/concerns: Pt stated concerns with recent hospitalization and loss of her long-term partner.  Duration of problem: Ongoing ; Severity of problem: moderate  Patient and/or Family's Strengths/Protective Factors: Social connections, Concrete supports in place (healthy food, safe environments, etc.), and Sense of purpose  Goals  Addressed: Patient will:  Reduce symptoms of: anxiety, depression, mood instability, and stress   Increase knowledge and/or ability of: coping skills, healthy habits, and stress reduction   Demonstrate ability to: Increase healthy adjustment to current life circumstances, Increase adequate support systems for patient/family, Improve medication compliance, and Continue healthy grieving over loss   Progress towards Goals: Ongoing  Interventions: Interventions utilized:  Motivational Interviewing, Solution-Focused Strategies, Supportive Counseling, and Supportive Reflection Standardized Assessments completed: Not Needed  Patient and/or Family Response: Pt agreeble  Plan: Follow up with behavioral health clinician on : November 30th at 1:15 PM Behavioral recommendations: -Continue prioritizing healthy self-care (regular meals, adequate rest; allowing practical help from supportive friends and family) until at least postpartum medical appointment Referral(s): [place] (In Clinic)  I discussed the assessment and treatment plan with the patient and/or parent/guardian. They were provided an opportunity to ask questions and all were answered. They agreed with the plan and demonstrated an understanding of the instructions.   They were advised to call back or seek an in-person evaluation if the symptoms worsen or if the condition fails to improve as anticipated.  Alba Destine

## 2022-09-05 ENCOUNTER — Ambulatory Visit: Payer: Medicaid Other | Attending: Audiology | Admitting: Audiology

## 2022-09-05 DIAGNOSIS — H903 Sensorineural hearing loss, bilateral: Secondary | ICD-10-CM | POA: Diagnosis not present

## 2022-09-05 NOTE — Procedures (Signed)
  Outpatient Audiology and [place], [place]  [id number] [phone]  AUDIOLOGICAL  EVALUATION  NAME: Jordan [name]     DOB:   March 28, 1966      MRN: [id number]                                                                                     DATE: 09/05/2022     REFERENT: [name], NP STATUS: Outpatient DIAGNOSIS: Sensorineural hearing loss, bilateral   History: [name] was seen for an audiological evaluation due to decreased hearing occurring for 3 months. [name] reports increased difficulty communicating with friends. She denies otalgia, tinnitus, aural fullness, and dizziness.   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 Test results are consistent with a mild to moderate sensorineural hearing loss, bilaterally. Speech Recognition Thresholds were obtained at 35 dB HL  in the right ear and at 35  dB HL in the left ear. Word Recognition Testing was completed at 75 dB HL and [name] scored 92%, bilaterally.     Results:  The test results and recommendations were reviewed with [name]. Today's test results are consistent with a mild to moderate sensorineural hearing loss, bilaterally. [name] will have communication difficulty in many listening environments and she will benefit form the use of good communication strategies and the use of amplification. Hearing Aids were reviewed. Due to [employer], she will need a referral to [facility] in [place] to their Hearing clinic for further management and to be fit with hearing aids.   Recommendations: 1.   Referral to [facility] in [place] for [place] Hearing Aids 2.   Communication Needs Assessment with an Audiologist to discuss the use of amplification    35 minutes spent testing and counseling on results.   If you have any questions please feel free to contact me at [phone].  Bari Mantis Audiologist, Au.D., CCC-A 09/05/2022  2:23 PM  Cc: [name], NP

## 2022-09-12 ENCOUNTER — Encounter: Payer: Self-pay | Admitting: Hematology and Oncology

## 2022-09-16 ENCOUNTER — Telehealth: Payer: Self-pay

## 2022-09-16 ENCOUNTER — Encounter: Payer: Self-pay | Admitting: Internal Medicine

## 2022-09-16 DIAGNOSIS — R270 Ataxia, unspecified: Secondary | ICD-10-CM | POA: Insufficient documentation

## 2022-09-16 NOTE — Telephone Encounter (Signed)
Received a call from patient stating that she continues to have dysuria and a strong vaginal odor.  Patient states she completed antibiotics as directed. Please advise.

## 2022-09-17 ENCOUNTER — Ambulatory Visit (HOSPITAL_BASED_OUTPATIENT_CLINIC_OR_DEPARTMENT_OTHER)
Admission: RE | Admit: 2022-09-17 | Discharge: 2022-09-17 | Disposition: A | Discharge status: Home / Self Care | Payer: Medicaid Other | Source: Ambulatory Visit | Attending: Nurse Practitioner | Admitting: Nurse Practitioner

## 2022-09-17 ENCOUNTER — Other Ambulatory Visit: Payer: Self-pay | Admitting: Oncology

## 2022-09-17 ENCOUNTER — Inpatient Hospital Stay: Payer: Medicaid Other

## 2022-09-17 ENCOUNTER — Ambulatory Visit: Payer: Medicaid Other

## 2022-09-17 DIAGNOSIS — Z1231 Encounter for screening mammogram for malignant neoplasm of breast: Secondary | ICD-10-CM | POA: Diagnosis present

## 2022-09-17 DIAGNOSIS — R300 Dysuria: Secondary | ICD-10-CM

## 2022-09-17 DIAGNOSIS — D649 Anemia, unspecified: Secondary | ICD-10-CM | POA: Diagnosis not present

## 2022-09-17 LAB — URINALYSIS, COMPLETE (UACMP) WITH MICROSCOPIC
Bilirubin Urine: NEGATIVE
Glucose, UA: NEGATIVE mg/dL
Hgb urine dipstick: NEGATIVE
Nitrite: POSITIVE — AB
Specific Gravity, Urine: 1.022 (ref 1.005–1.030)
WBC, UA: >50 WBC/hpf — ABNORMAL HIGH (ref 0–5)
pH: 5.5 (ref 5.0–8.0)

## 2022-09-17 NOTE — Progress Notes (Signed)
Radiation Oncology         [phone] ________________________________  Name: Jordan [name] MRN: [id number]  Date: 09/18/2022  DOB: 1966/07/03  Follow-Up Visit Note  CC: [name], NP  [name], NP  No diagnosis found.  Diagnosis: FIGO Stage I (pT1a, pN0) Squamous Cell Carcinoma of the Vagina   Interval Since Last Radiation: 2 years, 11 months, and 27 days  Radiation Treatment Dates: 08/24/2019 through 09/22/2019 Site Technique Total Dose (Gy) Dose per Fx (Gy) Completed Fx Beam Energies  Pelvis: Pelvis_vagina HDR-brachy 30/30 6 5/5 Ir-192   Narrative:  The patient returns today for routine follow-up, she was last seen here for follow-up on 07/23/22. Since her last visit, the patient was hospitalized form 08/17/22 through 08/22/22 with sepsis secondary to complicated UTI, severe pancytopenia, and anemia (also with cystitis present on admission). Hospital course included IV antibiotics and vitamin B12 injections for severe B12 deficiency. She was discharged with 5 additional days of Keflex to complete (for a total of 10 days of therapy for complicated UTI). She was also started on an estrogen vaginal suppository. Given her history of non-compliance with medications, she was prescribed oral B12 at discharge (vs presenting for injections). Imaging performed while inpatient includes:   -- Chest x-ray on 08/17/22 which showed no active disease or acute abnormalities.   -- CT of the head without contrast on 08/17/22 (prompted due to reports of dizziness and altered mental status on presentations) showed no acute intracranial process (however motion artifact limited this study).   -- CT of the abdomen and pelvis with contrast on 08/17/22 showed: mild bladder wall thickening worrisome for cystitis; a right bosniak I benign renal cyst measuring 2.7 cm; and stable splenomegaly. No other acute abnormalities or processes were appreciated in the abdomen or pelvis.   During her most recent  follow-up visit with Dr. Alvy Bimler on 09/03/22, labs reviewed showed a marked interval improvement of her blood counts with regular vitamin B12 use. However, the patient endorsed episodes of dizziness since being discharged from the hospital. Dr. Alvy Bimler also noted that she may have mild neuropathy from her severe B12 deficiency, and counseled the patient on the importance of adequate hydration. The patient also agreed to return for monthly vitamin B12 injections based on her above symptoms.      ***                         Allergies:  is allergic to ciprofloxacin, citalopram, lamotrigine, sulfa antibiotics, and tramadol.  Meds: Current Outpatient Medications  Medication Sig Dispense Refill   cyanocobalamin (VITAMIN B12) 1000 MCG tablet Take 1 tablet (1,000 mcg total) by mouth daily. 30 tablet 0   cyanocobalamin (VITAMIN B12) 1000 MCG/ML injection Inject 1 mL (1,000 mcg total) into the muscle every 7 (seven) days. 1 mL 0   estradiol (ESTRACE) 0.1 MG/GM vaginal cream Place 1 Applicatorful vaginally at bedtime. 37.6 g 12   folic acid (FOLVITE) 1 MG tablet Take 1 tablet (1 mg total) by mouth daily. 30 tablet 0   gabapentin (NEURONTIN) 800 MG tablet Take 800 mg by mouth 3 (three) times daily.     Oxycodone HCl 10 MG TABS Take 10 mg by mouth daily as needed.     No current facility-administered medications for this encounter.    Physical Findings: The patient is in no acute distress. Patient is alert and oriented.  vitals were not taken for this visit. .  No significant changes. Lungs are  clear to auscultation bilaterally. Heart has regular rate and rhythm. No palpable cervical, supraclavicular, or axillary adenopathy. Abdomen soft, non-tender, normal bowel sounds.  On pelvic examination the external genitalia were unremarkable. A speculum exam was performed. There are no mucosal lesions noted in the vaginal vault. A Pap smear was obtained of the proximal vagina. On bimanual and rectovaginal  examination there were no pelvic masses appreciated. ***    Lab Findings: Lab Results  Component Value Date   WBC 4.6 09/03/2022   HGB 11.1 (L) 09/03/2022   HCT 32.8 (L) 09/03/2022   MCV 111.2 (H) 09/03/2022   PLT 203 09/03/2022    Radiographic Findings: No results found.  Impression:  FIGO Stage I (pT1a, pN0) Squamous Cell Carcinoma of the Vagina   The patient is recovering from the effects of radiation.  ***  Plan:  ***   *** minutes of total time was spent for this patient encounter, including preparation, face-to-face counseling with the patient and coordination of care, physical exam, and documentation of the encounter. ____________________________________  Blair Promise, PhD, MD  This document serves as a record of services personally performed by Gery Pray, MD. It was created on his behalf by Roney Mans, a trained medical scribe. The creation of this record is based on the scribe's personal observations and the provider's statements to them. This document has been checked and approved by the attending provider.

## 2022-09-18 ENCOUNTER — Encounter: Payer: Self-pay | Admitting: Radiation Oncology

## 2022-09-18 ENCOUNTER — Ambulatory Visit
Admission: RE | Admit: 2022-09-18 | Discharge: 2022-09-18 | Disposition: A | Discharge status: Home / Self Care | Payer: Medicaid Other | Source: Ambulatory Visit | Attending: Radiation Oncology | Admitting: Radiation Oncology

## 2022-09-18 VITALS — BP 101/62 | HR 100 | Temp 97.6°F | Resp 20 | Ht 68.0 in | Wt 225.4 lb

## 2022-09-18 DIAGNOSIS — Z923 Personal history of irradiation: Secondary | ICD-10-CM | POA: Insufficient documentation

## 2022-09-18 DIAGNOSIS — R42 Dizziness and giddiness: Secondary | ICD-10-CM | POA: Insufficient documentation

## 2022-09-18 DIAGNOSIS — Z8544 Personal history of malignant neoplasm of other female genital organs: Secondary | ICD-10-CM | POA: Diagnosis present

## 2022-09-18 DIAGNOSIS — E538 Deficiency of other specified B group vitamins: Secondary | ICD-10-CM | POA: Diagnosis not present

## 2022-09-18 DIAGNOSIS — Z79899 Other long term (current) drug therapy: Secondary | ICD-10-CM | POA: Insufficient documentation

## 2022-09-18 DIAGNOSIS — C52 Malignant neoplasm of vagina: Secondary | ICD-10-CM

## 2022-09-18 DIAGNOSIS — Z859 Personal history of malignant neoplasm, unspecified: Secondary | ICD-10-CM

## 2022-09-18 MED ORDER — PHENAZOPYRIDINE HCL 200 MG PO TABS: 200.0000 mg | ORAL_TABLET | Freq: Three times a day (TID) | ORAL | 0 refills | Status: DC | PRN

## 2022-09-18 NOTE — Progress Notes (Signed)
Jordan [name] is here today for follow up post radiation to the pelvic.  They completed their radiation on: 09/22/2019   Does the patient complain of any of the following:  Pain: has burning with urination Abdominal bloating: no Diarrhea/Constipation: no Nausea/Vomiting: no Vaginal Discharge: no Blood in Urine or Stool: no Urinary Issues (dysuria/incomplete emptying/ incontinence/ increased frequency/urgency): burning with urination and a strong odor.  She reports this has been going on for 2-3 weeks. Does patient report using vaginal dilator 2-3 times a week and/or sexually active 2-3 weeks: no Post radiation skin changes: no   Additional comments if applicable: Has an appointment with the urologist in January.  BP 101/62 (BP Location: Left Arm, Patient Position: Sitting, Cuff Size: Large)   Pulse 100   Temp 97.6 F (36.4 C)   Resp 20   Ht '5\' 8"'$  (1.727 m)   Wt 225 lb 6.4 oz (102.2 kg)   LMP  (LMP Unknown)   SpO2 100%   BMI 34.27 kg/m

## 2022-09-19 ENCOUNTER — Telehealth: Payer: Self-pay

## 2022-09-19 ENCOUNTER — Ambulatory Visit: Payer: Medicaid Other | Admitting: Clinical

## 2022-09-19 DIAGNOSIS — F4321 Adjustment disorder with depressed mood: Secondary | ICD-10-CM

## 2022-09-19 LAB — URINE CULTURE: Culture: >=100000 — AB

## 2022-09-19 NOTE — Telephone Encounter (Signed)
Returned her call and left a message to call the office back. She left x 2 messages stating that she had a question.

## 2022-09-19 NOTE — BH Specialist Note (Signed)
Integrated Behavioral Health Follow Up In-Person Visit  MRN: [id number] Name: Elie Gragert  Number of [place] Clinician visits: 3- Third Visit  Session Start time: 0220   Session End time: 8937  Total time in minutes: 96   Types of Service: Individual psychotherapy  Interpretor:No. Interpretor Name and Language: N/A  Subjective: Varonica Siharath is a 56 y.o. female Patient was referred by Dr. Arlina Robes for grief . Patient reports the following symptoms/concerns: Grief associated with loss of her long-term partner and turbulent family relationships Duration of problem: ongoing; Severity of problem: moderate  Objective: Mood: Depressed and Irritable and Affect: Appropriate Risk of harm to self or others: No plan to harm self or others  Life Context: Family and Social: Pt just experienced first Thanksgiving without long term partner and is struggling with feeling around that  Self-Care: Pt has been spending more time with her landlord and has been working on her relationship with her daughter  Life Changes: Adjustment to passing of long-term partner through the holiday season  Patient and/or Family's Strengths/Protective Factors: Concrete supports in place (healthy food, safe environments, etc.) and Sense of purpose  Goals Addressed: Patient will:  Reduce symptoms of: anxiety and mood instability   Increase knowledge and/or ability of: coping skills, healthy habits, self-management skills, and stress reduction   Demonstrate ability to: Increase healthy adjustment to current life circumstances and continue healthy grieving over loss  Progress towards Goals: Ongoing  Interventions: Interventions utilized:  Motivational Interviewing and Supportive Counseling Standardized Assessments completed: Not Needed  Patient and/or Family Response: Pt agreeable to treatment plan   Plan: Follow up with behavioral health clinician on : December 14th @ 2:15,  virtual Behavioral recommendations:  -Continue prioritizing healthy self-care (regular meals, adequate rest; allowing practical help from supportive friends and family) ] Referral(s): [place] (In Clinic)  [place]

## 2022-09-20 ENCOUNTER — Other Ambulatory Visit: Payer: Self-pay

## 2022-09-20 ENCOUNTER — Telehealth: Payer: Self-pay

## 2022-09-20 ENCOUNTER — Encounter: Payer: Self-pay | Admitting: Hematology and Oncology

## 2022-09-20 ENCOUNTER — Encounter: Payer: Self-pay | Admitting: General Practice

## 2022-09-20 ENCOUNTER — Inpatient Hospital Stay: Payer: Medicaid Other

## 2022-09-20 ENCOUNTER — Inpatient Hospital Stay: Payer: Medicaid Other | Attending: Radiation Oncology

## 2022-09-20 VITALS — BP 122/71 | HR 86 | Temp 98.2°F | Resp 17

## 2022-09-20 DIAGNOSIS — N390 Urinary tract infection, site not specified: Secondary | ICD-10-CM | POA: Diagnosis present

## 2022-09-20 DIAGNOSIS — E538 Deficiency of other specified B group vitamins: Secondary | ICD-10-CM | POA: Insufficient documentation

## 2022-09-20 DIAGNOSIS — Z124 Encounter for screening for malignant neoplasm of cervix: Secondary | ICD-10-CM | POA: Insufficient documentation

## 2022-09-20 DIAGNOSIS — Z923 Personal history of irradiation: Secondary | ICD-10-CM | POA: Diagnosis not present

## 2022-09-20 DIAGNOSIS — Z8544 Personal history of malignant neoplasm of other female genital organs: Secondary | ICD-10-CM | POA: Diagnosis present

## 2022-09-20 MED ORDER — DEXTROSE 5 % IV SOLN
2.0000 g | Freq: Once | INTRAVENOUS | Status: AC
  Administered 2022-09-20: 2 g via INTRAVENOUS
  Filled 2022-09-20: qty 20

## 2022-09-20 MED ORDER — DEXTROSE 5 % IV SOLN: 2.0000 g | INTRAVENOUS | Status: DC

## 2022-09-20 MED ORDER — SODIUM CHLORIDE 0.9 % IV SOLN: Freq: Once | INTRAVENOUS | Status: AC

## 2022-09-20 MED FILL — Ceftriaxone Sodium For Inj 2 GM: INTRAMUSCULAR | Qty: 20 | Status: AC

## 2022-09-20 NOTE — Progress Notes (Signed)
[place] Spiritual Care Note  Received and returned voicemail from [place].   [place], [place], [facility] Pager [phone] Voicemail [phone]

## 2022-09-20 NOTE — Telephone Encounter (Signed)
Returned her call and left a message. She left a message earlier that she had a question. Ask her to call the office back.

## 2022-09-20 NOTE — Patient Instructions (Signed)
Ceftriaxone Injection What is this medication? CEFTRIAXONE (sef try AX one) treats infections caused by bacteria. It belongs to a group of medications called cephalosporin antibiotics. It will not treat colds, the flu, or infections caused by viruses. This medicine may be used for other purposes; ask your health care provider or pharmacist if you have questions. COMMON BRAND NAME(S): Ceftrisol Plus, Rocephin What should I tell my care team before I take this medication? They need to know if you have any of these conditions: Bleeding disorder High bilirubin level in newborn patients Kidney disease Liver disease Poor nutrition An unusual or allergic reaction to ceftriaxone, other penicillin or cephalosporin antibiotics, other medications, foods, dyes, or preservatives Pregnant or trying to get pregnant Breast-feeding How should I use this medication? This medication is injected into a vein or a muscle. It is usually given by your care team in a hospital or clinic setting. It may also be given at home. If you get this medication at home, you will be taught how to prepare and give it. Use exactly as directed. Take it as directed on the prescription label at the same time every day. Keep taking it even if you think you are better. It is important that you put your used needles and syringes in a special sharps container. Do not put them in a trash can. If you do not have a sharps container, call your pharmacist or care team to get one. Talk to your care team about the use of this medication in children. While it may be prescribed for children as young as newborns for selected conditions, precautions do apply. Overdosage: If you think you have taken too much of this medicine contact a poison control center or emergency room at once. NOTE: This medicine is only for you. Do not share this medicine with others. What if I miss a dose? If you get this medication at the hospital or clinic: It is important  not to miss your dose. Call your care team if you are unable to keep an appointment. If you give yourself this medication at home: If you miss a dose, take it as soon as you can. Then continue your normal schedule. If it is almost time for your next dose, take only that dose. Do not take double or extra doses. Call your care team with questions. What may interact with this medication? Estrogen or progestin hormones Intravenous calcium This list may not describe all possible interactions. Give your health care provider a list of all the medicines, herbs, non-prescription drugs, or dietary supplements you use. Also tell them if you smoke, drink alcohol, or use illegal drugs. Some items may interact with your medicine. What should I watch for while using this medication? Tell your care team if your symptoms do not start to get better or if they get worse. Do not treat diarrhea with over the counter products. Contact your care team if you have diarrhea that lasts more than 2 days or if it is severe and watery. If you have diabetes, you may get a false-positive result for sugar in your urine. Check with your care team. If you are being treated for a sexually transmitted infection (STI), avoid sexual contact until you have finished your treatment. Your partner may also need treatment. What side effects may I notice from receiving this medication? Side effects that you should report to your care team as soon as possible: Allergic reactions--skin rash, itching, hives, swelling of the face, lips, tongue, or throat   Hemolytic anemia--unusual weakness or fatigue, dizziness, headache, trouble breathing, dark urine, yellowing skin or eyes Severe diarrhea, fever Unusual vaginal discharge, itching, or odor Side effects that usually do not require medical attention (report to your care team if they continue or are bothersome): Diarrhea Headache Nausea Pain, redness, or irritation at injection site This list may  not describe all possible side effects. Call your doctor for medical advice about side effects. You may report side effects to FDA at 1-800-FDA-1088. Where should I keep my medication? Keep out of the reach of children and pets. You will be instructed on how to store this medication. Get rid of any unused medication after the expiration date. To get rid of medications that are no longer needed or have expired: Take the medication to a medication take-back program. Check with your pharmacy or law enforcement to find a location. If you cannot return the medication, ask your pharmacist or care team how to get rid of this medication safely. NOTE: This sheet is a summary. It may not cover all possible information. If you have questions about this medicine, talk to your doctor, pharmacist, or health care provider.  2023 Elsevier/Gold Standard (2007-11-28 00:00:00)  

## 2022-09-21 ENCOUNTER — Inpatient Hospital Stay: Payer: Medicaid Other

## 2022-09-21 VITALS — BP 124/96 | HR 80 | Temp 97.7°F | Resp 18

## 2022-09-21 DIAGNOSIS — N390 Urinary tract infection, site not specified: Secondary | ICD-10-CM

## 2022-09-21 DIAGNOSIS — E538 Deficiency of other specified B group vitamins: Secondary | ICD-10-CM

## 2022-09-21 DIAGNOSIS — Z8544 Personal history of malignant neoplasm of other female genital organs: Secondary | ICD-10-CM | POA: Diagnosis not present

## 2022-09-21 MED ORDER — SODIUM CHLORIDE 0.9 % IV SOLN: INTRAVENOUS | Status: DC | PRN

## 2022-09-21 MED ORDER — DEXTROSE 5 % IV SOLN
2.0000 g | Freq: Once | INTRAVENOUS | Status: AC
  Administered 2022-09-21: 2 g via INTRAVENOUS
  Filled 2022-09-21: qty 20

## 2022-09-21 NOTE — Patient Instructions (Signed)
Ceftriaxone Injection What is this medication? CEFTRIAXONE (sef try AX one) treats infections caused by bacteria. It belongs to a group of medications called cephalosporin antibiotics. It will not treat colds, the flu, or infections caused by viruses. This medicine may be used for other purposes; ask your health care provider or pharmacist if you have questions. COMMON BRAND NAME(S): Ceftrisol Plus, Rocephin What should I tell my care team before I take this medication? They need to know if you have any of these conditions: Bleeding disorder High bilirubin level in newborn patients Kidney disease Liver disease Poor nutrition An unusual or allergic reaction to ceftriaxone, other penicillin or cephalosporin antibiotics, other medications, foods, dyes, or preservatives Pregnant or trying to get pregnant Breast-feeding How should I use this medication? This medication is injected into a vein or a muscle. It is usually given by your care team in a hospital or clinic setting. It may also be given at home. If you get this medication at home, you will be taught how to prepare and give it. Use exactly as directed. Take it as directed on the prescription label at the same time every day. Keep taking it even if you think you are better. It is important that you put your used needles and syringes in a special sharps container. Do not put them in a trash can. If you do not have a sharps container, call your pharmacist or care team to get one. Talk to your care team about the use of this medication in children. While it may be prescribed for children as young as newborns for selected conditions, precautions do apply. Overdosage: If you think you have taken too much of this medicine contact a poison control center or emergency room at once. NOTE: This medicine is only for you. Do not share this medicine with others. What if I miss a dose? If you get this medication at the hospital or clinic: It is important  not to miss your dose. Call your care team if you are unable to keep an appointment. If you give yourself this medication at home: If you miss a dose, take it as soon as you can. Then continue your normal schedule. If it is almost time for your next dose, take only that dose. Do not take double or extra doses. Call your care team with questions. What may interact with this medication? Estrogen or progestin hormones Intravenous calcium This list may not describe all possible interactions. Give your health care provider a list of all the medicines, herbs, non-prescription drugs, or dietary supplements you use. Also tell them if you smoke, drink alcohol, or use illegal drugs. Some items may interact with your medicine. What should I watch for while using this medication? Tell your care team if your symptoms do not start to get better or if they get worse. Do not treat diarrhea with over the counter products. Contact your care team if you have diarrhea that lasts more than 2 days or if it is severe and watery. If you have diabetes, you may get a false-positive result for sugar in your urine. Check with your care team. If you are being treated for a sexually transmitted infection (STI), avoid sexual contact until you have finished your treatment. Your partner may also need treatment. What side effects may I notice from receiving this medication? Side effects that you should report to your care team as soon as possible: Allergic reactions--skin rash, itching, hives, swelling of the face, lips, tongue, or throat   Hemolytic anemia--unusual weakness or fatigue, dizziness, headache, trouble breathing, dark urine, yellowing skin or eyes Severe diarrhea, fever Unusual vaginal discharge, itching, or odor Side effects that usually do not require medical attention (report to your care team if they continue or are bothersome): Diarrhea Headache Nausea Pain, redness, or irritation at injection site This list may  not describe all possible side effects. Call your doctor for medical advice about side effects. You may report side effects to FDA at 1-800-FDA-1088. Where should I keep my medication? Keep out of the reach of children and pets. You will be instructed on how to store this medication. Get rid of any unused medication after the expiration date. To get rid of medications that are no longer needed or have expired: Take the medication to a medication take-back program. Check with your pharmacy or law enforcement to find a location. If you cannot return the medication, ask your pharmacist or care team how to get rid of this medication safely. NOTE: This sheet is a summary. It may not cover all possible information. If you have questions about this medicine, talk to your doctor, pharmacist, or health care provider.  2023 Elsevier/Gold Standard (2007-11-28 00:00:00)  

## 2022-09-23 ENCOUNTER — Telehealth: Payer: Self-pay

## 2022-09-23 ENCOUNTER — Inpatient Hospital Stay: Payer: Medicaid Other

## 2022-09-23 VITALS — BP 125/85 | HR 98 | Temp 97.9°F | Resp 18

## 2022-09-23 DIAGNOSIS — Z8544 Personal history of malignant neoplasm of other female genital organs: Secondary | ICD-10-CM | POA: Diagnosis not present

## 2022-09-23 DIAGNOSIS — E538 Deficiency of other specified B group vitamins: Secondary | ICD-10-CM

## 2022-09-23 MED ORDER — DEXTROSE 5 % IV SOLN
2.0000 g | Freq: Once | INTRAVENOUS | Status: AC
  Administered 2022-09-23: 2 g via INTRAVENOUS
  Filled 2022-09-23: qty 20

## 2022-09-23 MED ORDER — SODIUM CHLORIDE 0.9 % IV SOLN: INTRAVENOUS | Status: DC

## 2022-09-23 NOTE — Patient Instructions (Signed)
Ceftriaxone Injection What is this medication? CEFTRIAXONE (sef try AX one) treats infections caused by bacteria. It belongs to a group of medications called cephalosporin antibiotics. It will not treat colds, the flu, or infections caused by viruses. This medicine may be used for other purposes; ask your health care provider or pharmacist if you have questions. COMMON BRAND NAME(S): Ceftrisol Plus, Rocephin What should I tell my care team before I take this medication? They need to know if you have any of these conditions: Bleeding disorder High bilirubin level in newborn patients Kidney disease Liver disease Poor nutrition An unusual or allergic reaction to ceftriaxone, other penicillin or cephalosporin antibiotics, other medications, foods, dyes, or preservatives Pregnant or trying to get pregnant Breast-feeding How should I use this medication? This medication is injected into a vein or a muscle. It is usually given by your care team in a hospital or clinic setting. It may also be given at home. If you get this medication at home, you will be taught how to prepare and give it. Use exactly as directed. Take it as directed on the prescription label at the same time every day. Keep taking it even if you think you are better. It is important that you put your used needles and syringes in a special sharps container. Do not put them in a trash can. If you do not have a sharps container, call your pharmacist or care team to get one. Talk to your care team about the use of this medication in children. While it may be prescribed for children as young as newborns for selected conditions, precautions do apply. Overdosage: If you think you have taken too much of this medicine contact a poison control center or emergency room at once. NOTE: This medicine is only for you. Do not share this medicine with others. What if I miss a dose? If you get this medication at the hospital or clinic: It is important  not to miss your dose. Call your care team if you are unable to keep an appointment. If you give yourself this medication at home: If you miss a dose, take it as soon as you can. Then continue your normal schedule. If it is almost time for your next dose, take only that dose. Do not take double or extra doses. Call your care team with questions. What may interact with this medication? Estrogen or progestin hormones Intravenous calcium This list may not describe all possible interactions. Give your health care provider a list of all the medicines, herbs, non-prescription drugs, or dietary supplements you use. Also tell them if you smoke, drink alcohol, or use illegal drugs. Some items may interact with your medicine. What should I watch for while using this medication? Tell your care team if your symptoms do not start to get better or if they get worse. Do not treat diarrhea with over the counter products. Contact your care team if you have diarrhea that lasts more than 2 days or if it is severe and watery. If you have diabetes, you may get a false-positive result for sugar in your urine. Check with your care team. If you are being treated for a sexually transmitted infection (STI), avoid sexual contact until you have finished your treatment. Your partner may also need treatment. What side effects may I notice from receiving this medication? Side effects that you should report to your care team as soon as possible: Allergic reactions--skin rash, itching, hives, swelling of the face, lips, tongue, or throat   Hemolytic anemia--unusual weakness or fatigue, dizziness, headache, trouble breathing, dark urine, yellowing skin or eyes Severe diarrhea, fever Unusual vaginal discharge, itching, or odor Side effects that usually do not require medical attention (report to your care team if they continue or are bothersome): Diarrhea Headache Nausea Pain, redness, or irritation at injection site This list may  not describe all possible side effects. Call your doctor for medical advice about side effects. You may report side effects to FDA at 1-800-FDA-1088. Where should I keep my medication? Keep out of the reach of children and pets. You will be instructed on how to store this medication. Get rid of any unused medication after the expiration date. To get rid of medications that are no longer needed or have expired: Take the medication to a medication take-back program. Check with your pharmacy or law enforcement to find a location. If you cannot return the medication, ask your pharmacist or care team how to get rid of this medication safely. NOTE: This sheet is a summary. It may not cover all possible information. If you have questions about this medicine, talk to your doctor, pharmacist, or health care provider.  2023 Elsevier/Gold Standard (2007-11-28 00:00:00)  

## 2022-09-23 NOTE — Telephone Encounter (Signed)
Returned her call. She is asking if she can get b12 injection tomorrow with IV antibiotics appt in infusion. Per Dr. Alvy Bimler okay with B12 tomorrow. Added to appt notes for 12/5 and canceled 12/12 injection appt. She verbalized understanding.

## 2022-09-24 ENCOUNTER — Inpatient Hospital Stay: Payer: Medicaid Other

## 2022-09-24 ENCOUNTER — Other Ambulatory Visit: Payer: Self-pay

## 2022-09-24 ENCOUNTER — Other Ambulatory Visit: Payer: Self-pay | Admitting: *Deleted

## 2022-09-24 VITALS — BP 125/80 | HR 80 | Temp 98.4°F | Resp 18

## 2022-09-24 DIAGNOSIS — E538 Deficiency of other specified B group vitamins: Secondary | ICD-10-CM

## 2022-09-24 DIAGNOSIS — N3001 Acute cystitis with hematuria: Secondary | ICD-10-CM

## 2022-09-24 DIAGNOSIS — Z8544 Personal history of malignant neoplasm of other female genital organs: Secondary | ICD-10-CM | POA: Diagnosis not present

## 2022-09-24 MED ORDER — DEXTROSE 5 % IV SOLN
2.0000 g | Freq: Once | INTRAVENOUS | Status: AC
  Administered 2022-09-24: 2 g via INTRAVENOUS
  Filled 2022-09-24: qty 20

## 2022-09-24 MED ORDER — CYANOCOBALAMIN 1000 MCG/ML IJ SOLN
1000.0000 ug | Freq: Once | INTRAMUSCULAR | Status: AC
  Administered 2022-09-24: 1000 ug via INTRAMUSCULAR
  Filled 2022-09-24: qty 1

## 2022-09-24 MED ORDER — SODIUM CHLORIDE 0.9 % IV SOLN: Freq: Once | INTRAVENOUS | Status: DC

## 2022-09-24 MED ORDER — SODIUM CHLORIDE 0.9 % IV SOLN: Freq: Once | INTRAVENOUS | Status: AC

## 2022-09-24 NOTE — Patient Instructions (Signed)
Ceftriaxone Injection What is this medication? CEFTRIAXONE (sef try AX one) treats infections caused by bacteria. It belongs to a group of medications called cephalosporin antibiotics. It will not treat colds, the flu, or infections caused by viruses. This medicine may be used for other purposes; ask your health care provider or pharmacist if you have questions. COMMON BRAND NAME(S): Ceftrisol Plus, Rocephin What should I tell my care team before I take this medication? They need to know if you have any of these conditions: Bleeding disorder High bilirubin level in newborn patients Kidney disease Liver disease Poor nutrition An unusual or allergic reaction to ceftriaxone, other penicillin or cephalosporin antibiotics, other medications, foods, dyes, or preservatives Pregnant or trying to get pregnant Breast-feeding How should I use this medication? This medication is injected into a vein or a muscle. It is usually given by your care team in a hospital or clinic setting. It may also be given at home. If you get this medication at home, you will be taught how to prepare and give it. Use exactly as directed. Take it as directed on the prescription label at the same time every day. Keep taking it even if you think you are better. It is important that you put your used needles and syringes in a special sharps container. Do not put them in a trash can. If you do not have a sharps container, call your pharmacist or care team to get one. Talk to your care team about the use of this medication in children. While it may be prescribed for children as young as newborns for selected conditions, precautions do apply. Overdosage: If you think you have taken too much of this medicine contact a poison control center or emergency room at once. NOTE: This medicine is only for you. Do not share this medicine with others. What if I miss a dose? If you get this medication at the hospital or clinic: It is important  not to miss your dose. Call your care team if you are unable to keep an appointment. If you give yourself this medication at home: If you miss a dose, take it as soon as you can. Then continue your normal schedule. If it is almost time for your next dose, take only that dose. Do not take double or extra doses. Call your care team with questions. What may interact with this medication? Estrogen or progestin hormones Intravenous calcium This list may not describe all possible interactions. Give your health care provider a list of all the medicines, herbs, non-prescription drugs, or dietary supplements you use. Also tell them if you smoke, drink alcohol, or use illegal drugs. Some items may interact with your medicine. What should I watch for while using this medication? Tell your care team if your symptoms do not start to get better or if they get worse. Do not treat diarrhea with over the counter products. Contact your care team if you have diarrhea that lasts more than 2 days or if it is severe and watery. If you have diabetes, you may get a false-positive result for sugar in your urine. Check with your care team. If you are being treated for a sexually transmitted infection (STI), avoid sexual contact until you have finished your treatment. Your partner may also need treatment. What side effects may I notice from receiving this medication? Side effects that you should report to your care team as soon as possible: Allergic reactions--skin rash, itching, hives, swelling of the face, lips, tongue, or throat   Hemolytic anemia--unusual weakness or fatigue, dizziness, headache, trouble breathing, dark urine, yellowing skin or eyes Severe diarrhea, fever Unusual vaginal discharge, itching, or odor Side effects that usually do not require medical attention (report to your care team if they continue or are bothersome): Diarrhea Headache Nausea Pain, redness, or irritation at injection site This list may  not describe all possible side effects. Call your doctor for medical advice about side effects. You may report side effects to FDA at 1-800-FDA-1088. Where should I keep my medication? Keep out of the reach of children and pets. You will be instructed on how to store this medication. Get rid of any unused medication after the expiration date. To get rid of medications that are no longer needed or have expired: Take the medication to a medication take-back program. Check with your pharmacy or law enforcement to find a location. If you cannot return the medication, ask your pharmacist or care team how to get rid of this medication safely. NOTE: This sheet is a summary. It may not cover all possible information. If you have questions about this medicine, talk to your doctor, pharmacist, or health care provider.  2023 Elsevier/Gold Standard (2007-11-28 00:00:00)  

## 2022-09-25 ENCOUNTER — Inpatient Hospital Stay: Payer: Medicaid Other

## 2022-09-25 ENCOUNTER — Inpatient Hospital Stay (HOSPITAL_BASED_OUTPATIENT_CLINIC_OR_DEPARTMENT_OTHER): Payer: Medicaid Other | Admitting: Obstetrics & Gynecology

## 2022-09-25 ENCOUNTER — Encounter: Payer: Self-pay | Admitting: Obstetrics & Gynecology

## 2022-09-25 VITALS — BP 138/74 | HR 74 | Temp 97.8°F | Resp 16 | Wt 226.6 lb

## 2022-09-25 DIAGNOSIS — N390 Urinary tract infection, site not specified: Secondary | ICD-10-CM | POA: Diagnosis not present

## 2022-09-25 DIAGNOSIS — Z8544 Personal history of malignant neoplasm of other female genital organs: Secondary | ICD-10-CM | POA: Diagnosis not present

## 2022-09-25 DIAGNOSIS — E538 Deficiency of other specified B group vitamins: Secondary | ICD-10-CM

## 2022-09-25 DIAGNOSIS — C52 Malignant neoplasm of vagina: Secondary | ICD-10-CM

## 2022-09-25 DIAGNOSIS — Z87440 Personal history of urinary (tract) infections: Secondary | ICD-10-CM

## 2022-09-25 LAB — URINALYSIS, COMPLETE (UACMP) WITH MICROSCOPIC
Bilirubin Urine: NEGATIVE
Glucose, UA: NEGATIVE mg/dL
Hgb urine dipstick: NEGATIVE
Ketones, ur: NEGATIVE mg/dL
Leukocytes,Ua: NEGATIVE
Nitrite: NEGATIVE
Protein, ur: NEGATIVE mg/dL
Specific Gravity, Urine: 1.02 (ref 1.005–1.030)
pH: 6 (ref 5.0–8.0)

## 2022-09-25 NOTE — Progress Notes (Addendum)
Follow Up Note: Gyn-Onc  Jordan [name] 56 y.o. female  CC: She presents for a f/u visit   HPI: The oncology history was reviewed.  Interval History: She denies any vaginal bleeding, abdominal/pelvic pain, leg pain, cough or weight loss,    A Pap in 6/23 showed ASCUS; no reflex HPV testing. She was seen by Dr. Sondra Come last month.   She was found to have no clinical evidence of recurrence.  She reports episodic treatment for rUTI.  Completed a curse of antibx yesterday; reports persistent sxs. C/O vulva irritative sxs.  Declines topical/vaginal E2.    Review of Systems  Review of Systems  Constitutional:  Negative for malaise/fatigue and weight loss.  Respiratory:  Negative for shortness of breath and wheezing.   Cardiovascular:  Negative for chest pain and leg swelling.  Gastrointestinal:  Negative for abdominal pain, blood in stool, constipation, nausea and vomiting.  Genitourinary:  Positive for dysuria, frequency and urgency.  Musculoskeletal:  Negative for joint pain and myalgias.  Neurological:  Negative for weakness.  Psychiatric/Behavioral:  Negative for depression. The patient does not have insomnia.    Current medications, allergy, social history, past surgical history, past medical history, family history were all reviewed.    Vitals:  BP 138/74 (BP Location: Left Arm, Patient Position: Sitting)   Pulse 74   Temp 97.8 F (36.6 C) (Oral)   Resp 16   Wt 226 lb 9.6 oz (102.8 kg)   LMP  (LMP Unknown)   SpO2 100%   BMI 34.45 kg/m    Physical Exam:  Physical Exam Exam conducted with a chaperone present.  Constitutional:      General: She is not in acute distress. Cardiovascular:     Rate and Rhythm: Normal rate and regular rhythm.  Pulmonary:     Effort: Pulmonary effort is normal.     Breath sounds: Normal breath sounds. No wheezing or rhonchi.  Abdominal:     Palpations: Abdomen is soft.     Tenderness: There is no abdominal tenderness. There is no right CVA  tenderness or left CVA tenderness.     Hernia: No hernia is present.  Genitourinary:    General: Normal vulva.     Urethra: No urethral lesion.     Vagina: No lesions. No bleeding.  Foreshortened.  HPV collected Musculoskeletal:     Cervical back: Neck supple.     Right lower leg: No edema.     Left lower leg: No edema.  Lymphadenopathy:     Upper Body:     Right upper body: No supraclavicular adenopathy.     Left upper body: No supraclavicular adenopathy.     Lower Body: No right inguinal adenopathy. No left inguinal adenopathy.  Skin:    Findings: No rash.  Neurological:     Mental Status: She is oriented to person, place, and time.   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  No evidence of recurrence  >review the HR-HPV testing  >continue  q 6  month surveillance; keep previously scheduled appointment w/Dr. Sondra Come. >continue pap testing of the vagina annually in June.   >return prn or in 11/24    I personally spent 30 minutes face-to-face and non-face-to-face in the care of this patient, which includes all pre, intra, and post visit time on the date of service.    Lahoma Crocker, MD

## 2022-09-25 NOTE — Assessment & Plan Note (Addendum)
H/O Stage IA SCC of vagina Last Pap test showed ASCUS/no reflex HPV testing Negative symptom review, normal exam.  No evidence of recurrence  >review the HR-HPV testing  >continue  q 6  month surveillance; keep previously scheduled appointment w/Dr. Sondra Come. >continue pap testing of the vagina annually in June.   >return prn or in 11/24

## 2022-09-25 NOTE — Patient Instructions (Addendum)
Return in 1 year   Vulvar/Vaginal Moisturizers  Moisturizer Options: Vitamin E oil: pump or capsule form Vitamin E cream (Gene's vitamin E cream) Coconut oil: bottle or bead form Shea butter Blossom Organic Lubricant (organic and all natural; www.blossomorganics.com) PE suppository(coconut oil/vitamin E/palm oil) Desert Harvest Aloe Glide      Consider the ingredients of the product - the fewer the ingredients the better!  Directions for Use: Clean and dry your hands Gently dab the vulvar/vaginal area dry as needed Apply a "pea-sized" amount of the moisturizer onto your fingertip Using you other hand, open the labia   Apply the moisturizer to the vulvar/vaginal tissues Wear loose fitting underwear/clothing if possible following application  Use moisturize 2-3 times daily as desired. 

## 2022-09-26 ENCOUNTER — Telehealth: Payer: Self-pay | Admitting: *Deleted

## 2022-09-26 LAB — URINE CULTURE: Culture: NO GROWTH

## 2022-09-26 NOTE — Telephone Encounter (Signed)
Received a phone from cytology regarding to pap smear collected yesterday. Per cytology the order is wrong and needs to be changed. Spoke with Dr Delsa Sale and the patient had a pap smear in June 2023 with a negative HPV. Per Dr Delsa Sale order can be canceled and discard the specimen. Message given to the cytology department

## 2022-09-27 ENCOUNTER — Telehealth: Payer: Self-pay | Admitting: *Deleted

## 2022-09-27 NOTE — Telephone Encounter (Signed)
Patient called for her urine results. Explained that the office will touch base with Dr Delsa Sale and call her back with either the results or to let her know they are not ready

## 2022-09-27 NOTE — Telephone Encounter (Signed)
Spoke with patient to advise of negative urine culture.

## 2022-09-30 ENCOUNTER — Telehealth: Payer: Self-pay

## 2022-09-30 NOTE — Telephone Encounter (Signed)
Left message advising Pt to continue with adequate hydration r/t dizziness over the weekend.   Pt called back and we discussed proper hydration with water vs juice/soda. Pt stated she does not believe symptoms are hydration related. Advised Pt to seek care at Urgent Care or ED if symptoms worsen even with hydration. Pt verbalized understanding.

## 2022-10-01 ENCOUNTER — Inpatient Hospital Stay: Payer: Medicaid Other

## 2022-10-03 ENCOUNTER — Other Ambulatory Visit (HOSPITAL_COMMUNITY)
Admission: RE | Admit: 2022-10-03 | Discharge: 2022-10-03 | Disposition: A | Discharge status: Home / Self Care | Payer: Medicaid Other | Source: Ambulatory Visit | Attending: Family Medicine | Admitting: Family Medicine

## 2022-10-03 ENCOUNTER — Ambulatory Visit (INDEPENDENT_AMBULATORY_CARE_PROVIDER_SITE_OTHER): Payer: Medicaid Other

## 2022-10-03 ENCOUNTER — Ambulatory Visit: Payer: Medicaid Other | Admitting: Clinical

## 2022-10-03 VITALS — BP 136/83 | HR 80 | Ht 68.0 in | Wt 223.0 lb

## 2022-10-03 DIAGNOSIS — F4321 Adjustment disorder with depressed mood: Secondary | ICD-10-CM

## 2022-10-03 DIAGNOSIS — N898 Other specified noninflammatory disorders of vagina: Secondary | ICD-10-CM | POA: Insufficient documentation

## 2022-10-03 DIAGNOSIS — R8290 Unspecified abnormal findings in urine: Secondary | ICD-10-CM

## 2022-10-03 LAB — POCT URINALYSIS DIP (DEVICE)
Bilirubin Urine: NEGATIVE
Glucose, UA: NEGATIVE mg/dL
Ketones, ur: NEGATIVE mg/dL
Leukocytes,Ua: NEGATIVE
Nitrite: NEGATIVE
Protein, ur: NEGATIVE mg/dL
Specific Gravity, Urine: >=1.03 (ref 1.005–1.030)
Urobilinogen, UA: 1 mg/dL (ref 0.0–1.0)
pH: 6 (ref 5.0–8.0)

## 2022-10-03 NOTE — Progress Notes (Signed)
Pt here today with c/o vaginal odor, vaginal irritation, and burning.  Pt reports that she has a history of UTI.  I explained to the pt how to obtain a self swab and to leave a urine for UA.  UA resulted in trace of blood.  Pt notified we would contact her with abnormal results.  Pt verbalized understanding.   Mel Almond, RN  10/03/22

## 2022-10-03 NOTE — BH Specialist Note (Signed)
Integrated Behavioral Health Follow Up In-Person Visit  MRN: [id number] Name: Jordan [name]  Number of [facility] Clinician visits: 4- Fourth Visit  Session Start time: 0220   Session End time: 0345  Total time in minutes: 85   I reviewed patient visit with the [facility] Intern, and I concur with the treatment plan, as documented in the [facility] Intern note.   No charge for this visit due to [facility] Intern seeing patient.  Vesta Mixer, MSW, [place] for [facility] at [facility]   Types of Service: Individual psychotherapy  Interpretor:No.   Subjective: Jordan [name] is a 56 y.o. female  Patient was referred by Dr. Arlina Robes for grief . Patient reports the following symptoms/concerns: Grief associated with the loss of her long term partner. Concerns with recent medical diagnoses associated with recurrent UTI's/ Duration of problem: ongoing; Severity of problem: moderate  Objective: Mood: Angry, Anxious, Irritable, and Worthless and Affect: Appropriate Risk of harm to self or others: No plan to harm self or others  Life Context: Family and Social: Pt concerned about upcoming Christmas holiday without long-term partner  Self-Care: Pt recently gifted a cat but struggles to afford food for it but enjoys the company  Life Changes: Pt recently in contact with daughter more frequently   Patient and/or Family's Strengths/Protective Factors: Social connections and Sense of purpose  Goals Addressed: Patient will:  Reduce symptoms of: anxiety, depression, mood instability, and stress   Increase knowledge and/or ability of: coping skills, healthy habits, self-management skills, and stress reduction   Demonstrate ability to: Increase healthy adjustment to current life circumstances, Increase adequate support systems for patient/family, Improve medication compliance, and continue healthy grieving over  loss   Progress towards Goals: Ongoing  Interventions: Interventions utilized:  Motivational Interviewing, Solution-Focused Strategies, and Supportive Counseling Standardized Assessments completed: Patient declined screening  Patient and/or Family Response: Pt agrees with treatment plan    Plan: Follow up with behavioral health clinician on: December 28th, 3:15 PM  Behavioral recommendations: -Continue prioritizing healthy self-care (regular meals, adequate rest; allowing practical help from supportive friends and family)  -Prioritize building a relationship with daughter Referral(s): [place] [facility]) [place]

## 2022-10-04 LAB — CERVICOVAGINAL ANCILLARY ONLY
Bacterial Vaginitis (gardnerella): NEGATIVE
Candida Glabrata: NEGATIVE
Candida Vaginitis: NEGATIVE
Chlamydia: NEGATIVE
Comment: NEGATIVE
Comment: NEGATIVE
Comment: NEGATIVE
Comment: NEGATIVE
Comment: NEGATIVE
Comment: NORMAL
Neisseria Gonorrhea: NEGATIVE
Trichomonas: NEGATIVE

## 2022-10-07 ENCOUNTER — Ambulatory Visit: Payer: Self-pay | Admitting: Radiation Oncology

## 2022-10-07 ENCOUNTER — Telehealth: Payer: Self-pay

## 2022-10-07 NOTE — Telephone Encounter (Signed)
Returned her call and left a message. She left another message that she is feeling dizzy and light headed. She feels like she is drinking plenty of fluids and not dehydrated. Left a message instructing her to go to the ED now or call 911. Ask her to call the office back if needed.

## 2022-10-07 NOTE — Telephone Encounter (Signed)
Returned her call again. Left a message instructing her to go to urgent care, ED or call 911.

## 2022-10-07 NOTE — Telephone Encounter (Signed)
She called and left a message that she is feeling some better.

## 2022-10-07 NOTE — Telephone Encounter (Signed)
Called her back and left a message for her to call the office. She left a message earlier that she is feeling light headed and dizzy. Left a message to drink more fluids.

## 2022-10-08 ENCOUNTER — Telehealth: Payer: Self-pay | Admitting: Family Medicine

## 2022-10-08 NOTE — Telephone Encounter (Signed)
Pt called regarding results from UA and Cervicovaginal swab completed 10/03/2022. I gave patient results. She verbalized understanding and denied further questions.

## 2022-10-08 NOTE — Telephone Encounter (Signed)
Patient would like to a call regarding test result [phone]

## 2022-10-08 NOTE — Telephone Encounter (Signed)
Attempted to return pt call. Left VM. Will attempt to call at later time.

## 2022-10-09 LAB — URINE CULTURE

## 2022-10-10 ENCOUNTER — Other Ambulatory Visit: Payer: Self-pay

## 2022-10-10 ENCOUNTER — Other Ambulatory Visit: Payer: Self-pay | Admitting: Obstetrics and Gynecology

## 2022-10-10 MED ORDER — CEPHALEXIN 500 MG PO CAPS: 500.0000 mg | ORAL_CAPSULE | Freq: Four times a day (QID) | ORAL | 0 refills | Status: AC

## 2022-10-10 NOTE — Telephone Encounter (Signed)
Per Dr. Elgie Congo pt needs Keflex 500 mg po 4 times daily for UTI.  Left message for pt that we have sent an antibiotic for a UTI to her [facility] on [address].

## 2022-10-10 NOTE — Telephone Encounter (Signed)
Pt returned call.  Pt informed of UTI and Keflex sent to her [facility] on [address].  Pt verbalized understanding.    Frances Nickels  10/10/22

## 2022-10-11 ENCOUNTER — Encounter (HOSPITAL_COMMUNITY): Payer: Self-pay

## 2022-10-11 ENCOUNTER — Emergency Department (HOSPITAL_COMMUNITY)
Admission: EM | Admit: 2022-10-11 | Discharge: 2022-10-11 | Disposition: A | Discharge status: Home / Self Care | Payer: Medicaid Other | Attending: Emergency Medicine | Admitting: Emergency Medicine

## 2022-10-11 ENCOUNTER — Encounter: Payer: Self-pay | Admitting: General Practice

## 2022-10-11 DIAGNOSIS — R42 Dizziness and giddiness: Secondary | ICD-10-CM | POA: Diagnosis not present

## 2022-10-11 DIAGNOSIS — N390 Urinary tract infection, site not specified: Secondary | ICD-10-CM | POA: Diagnosis not present

## 2022-10-11 DIAGNOSIS — Z79899 Other long term (current) drug therapy: Secondary | ICD-10-CM | POA: Diagnosis not present

## 2022-10-11 DIAGNOSIS — I10 Essential (primary) hypertension: Secondary | ICD-10-CM | POA: Diagnosis not present

## 2022-10-11 DIAGNOSIS — N3090 Cystitis, unspecified without hematuria: Secondary | ICD-10-CM

## 2022-10-11 LAB — BASIC METABOLIC PANEL
Anion gap: 8 (ref 5–15)
BUN: 12 mg/dL (ref 6–20)
CO2: 28 mmol/L (ref 22–32)
Calcium: 9.3 mg/dL (ref 8.9–10.3)
Chloride: 104 mmol/L (ref 98–111)
Creatinine, Ser: 0.59 mg/dL (ref 0.44–1.00)
GFR, Estimated: >60 mL/min (ref >60)
Glucose, Bld: 91 mg/dL (ref 70–99)
Potassium: 5.4 mmol/L — ABNORMAL HIGH (ref 3.5–5.1)
Sodium: 140 mmol/L (ref 135–145)

## 2022-10-11 LAB — URINALYSIS, ROUTINE W REFLEX MICROSCOPIC
Bilirubin Urine: NEGATIVE
Glucose, UA: NEGATIVE mg/dL
Hgb urine dipstick: NEGATIVE
Ketones, ur: NEGATIVE mg/dL
Leukocytes,Ua: NEGATIVE
Nitrite: POSITIVE — AB
Protein, ur: NEGATIVE mg/dL
Specific Gravity, Urine: 1.013 (ref 1.005–1.030)
pH: 8 (ref 5.0–8.0)

## 2022-10-11 LAB — CBC
HCT: 46.7 % — ABNORMAL HIGH (ref 36.0–46.0)
Hemoglobin: 15.5 g/dL — ABNORMAL HIGH (ref 12.0–15.0)
MCH: 32.5 pg (ref 26.0–34.0)
MCHC: 33.2 g/dL (ref 30.0–36.0)
MCV: 97.9 fL (ref 80.0–100.0)
Platelets: 144 10*3/uL — ABNORMAL LOW (ref 150–400)
RBC: 4.77 MIL/uL (ref 3.87–5.11)
RDW: 12 % (ref 11.5–15.5)
WBC: 4.5 10*3/uL (ref 4.0–10.5)
nRBC: 0 % (ref 0.0–0.2)

## 2022-10-11 LAB — CBG MONITORING, ED: Glucose-Capillary: 81 mg/dL (ref 70–99)

## 2022-10-11 MED ORDER — SODIUM CHLORIDE 0.9 % IV BOLUS
500.0000 mL | Freq: Once | INTRAVENOUS | Status: AC
  Administered 2022-10-11: 500 mL via INTRAVENOUS

## 2022-10-11 MED ORDER — METOCLOPRAMIDE HCL 5 MG/ML IJ SOLN
10.0000 mg | Freq: Once | INTRAMUSCULAR | Status: AC
  Administered 2022-10-11: 10 mg via INTRAVENOUS
  Filled 2022-10-11: qty 2

## 2022-10-11 MED ORDER — SODIUM CHLORIDE 0.9 % IV SOLN
1.0000 g | Freq: Once | INTRAVENOUS | Status: AC
  Administered 2022-10-11: 1 g via INTRAVENOUS
  Filled 2022-10-11: qty 10

## 2022-10-11 MED ORDER — MECLIZINE HCL 25 MG PO TABS: 25.0000 mg | ORAL_TABLET | Freq: Three times a day (TID) | ORAL | 0 refills | Status: DC | PRN

## 2022-10-11 NOTE — Progress Notes (Signed)
[place] Spiritual Care Note  Received and returned call from [place]. She wanted to share holiday greetings. The timing of the call was inconvenient for her, so she plans to phone again to connect in the new year.   [place], [place], [facility] Pager [phone] Voicemail [phone]

## 2022-10-11 NOTE — ED Triage Notes (Signed)
Pt arrived via EMS, from drs office. C/o dizziness.

## 2022-10-11 NOTE — ED Provider Notes (Addendum)
[name] DEPT Provider Note   CSN: [id number] Arrival date & time: 10/11/22  1026     History  Chief Complaint  Patient presents with   Dizziness    [facility] [name] is a 56 y.o. female.   Dizziness   56 year old female presents emergency department with complaints of dizziness.  Patient reports history of anemia as well as orthostatic hypotension but states this feels different.  Reports symptoms beginning this morning no longer after awakening.  She states that she went to the restroom and laid back down which she noted acute onset dizziness described as room spinning type sensation.  She reports intermittent exacerbation of symptoms with positional changes of the head since onset.  Denies changes in vision, difficulty speaking, gait abnormalities from baseline, chest pain, shortness of breath, slurred speech, drooping of the face, weakness/sensory deficits.  Was seen at primary care and told to come the emergency department for further evaluation.  Has taken no medication for this.  She also states she has urinary tract infection tested by OB/GYN outpatient for which she has not taken any antibiotics and is requesting antibiotics given intravenously.  Past medical history significant for megaloblastic anemia, bipolar 1 disorder urinary tract infection  Home Medications Prior to Admission medications   Medication Sig Start Date End Date Taking? Authorizing Provider  meclizine (ANTIVERT) 25 MG tablet Take 1 tablet (25 mg total) by mouth 3 (three) times daily as needed for dizziness. 10/11/22  Yes Dion Saucier A, PA  cephALEXin (KEFLEX) 500 MG capsule Take 1 capsule (500 mg total) by mouth 4 (four) times daily for 7 days. 10/10/22 10/17/22  Griffin Basil, MD  cyanocobalamin (VITAMIN B12) 1000 MCG/ML injection Inject 1 mL (1,000 mcg total) into the muscle every 7 (seven) days. 08/22/22   Barb Merino, MD  gabapentin (NEURONTIN) 800 MG tablet Take 800  mg by mouth 3 (three) times daily. 01/11/22   [provider]  Oxycodone HCl 10 MG TABS Take 10 mg by mouth daily as needed. 01/27/20   [provider]      Allergies    Ciprofloxacin, Citalopram, Lamotrigine, Sulfa antibiotics, and Tramadol    Review of Systems   Review of Systems  Neurological:  Positive for dizziness.  All other systems reviewed and are negative.   Physical Exam Updated Vital Signs BP (!) 156/69 (BP Location: Left Arm)   Pulse 68   Temp 97.8 F (36.6 C) (Oral)   Resp 18   LMP  (LMP Unknown)   SpO2 100%  Physical Exam Vitals and nursing note reviewed.  Constitutional:      General: She is not in acute distress.    Appearance: She is well-developed.  HENT:     Head: Normocephalic and atraumatic.  Eyes:     Conjunctiva/sclera: Conjunctivae normal.  Cardiovascular:     Rate and Rhythm: Normal rate and regular rhythm.     Heart sounds: No murmur heard. Pulmonary:     Effort: Pulmonary effort is normal. No respiratory distress.     Breath sounds: Normal breath sounds.  Abdominal:     Palpations: Abdomen is soft.     Tenderness: There is no abdominal tenderness.  Musculoskeletal:        General: No swelling.     Cervical back: Neck supple.  Skin:    General: Skin is warm and dry.     Capillary Refill: Capillary refill takes less than 2 seconds.  Neurological:     Mental Status:  She is alert.     Comments: Alert and oriented to self, place, time and event.   Speech is fluent, clear without dysarthria or dysphasia.   Strength 5/5 in upper/lower extremities   Sensation intact in upper/lower extremities   Negative Romberg. No pronator drift.  Normal finger-to-nose and feet tapping.  CN I not tested  CN II grossly intact visual fields bilaterally. Did not visualize posterior eye.  CN III, IV, VI PERRLA and EOMs intact bilaterally  CN V Intact sensation to sharp and light touch to the face  CN VII facial movements symmetric  CN VIII  not tested  CN IX, X no uvula deviation, symmetric rise of soft palate  CN XI 5/5 SCM and trapezius strength bilaterally  CN XII Midline tongue protrusion, symmetric L/R movements   Head impulse test: Positive with right upper deviation.  EOMs noted rightward beating nystagmus.  Patient ambulates with walker at baseline and sometimes assisted per patient of which she had no difficulty in the room.  Psychiatric:        Mood and Affect: Mood normal.     ED Results / Procedures / Treatments   Labs (all labs ordered are listed, but only abnormal results are displayed) Labs Reviewed  BASIC METABOLIC PANEL - Abnormal; Notable for the following components:      Result Value   Potassium 5.4 (*)    All other components within normal limits  CBC - Abnormal; Notable for the following components:   Hemoglobin 15.5 (*)    HCT 46.7 (*)    Platelets 144 (*)    All other components within normal limits  URINE CULTURE  URINALYSIS, ROUTINE W REFLEX MICROSCOPIC  CBG MONITORING, ED    EKG None  Radiology No results found.  Procedures Procedures    Medications Ordered in ED Medications  sodium chloride 0.9 % bolus 500 mL (500 mLs Intravenous New Bag/Given 10/11/22 1234)  metoCLOPramide (REGLAN) injection 10 mg (10 mg Intravenous Given 10/11/22 1233)  cefTRIAXone (ROCEPHIN) 1 g in sodium chloride 0.9 % 100 mL IVPB (1 g Intravenous New Bag/Given 10/11/22 1234)    ED Course/ Medical Decision Making/ A&P                           Medical Decision Making Amount and/or Complexity of Data Reviewed Labs: ordered.  Risk Prescription drug management.   This patient presents to the ED for concern of dizziness, this involves an extensive number of treatment options, and is a complaint that carries with it a high risk of complications and morbidity.  The differential diagnosis includes anemia, arrhythmia, ACS, CVA, vertigo, electrolyte abnormalities, medication side effect, malignancy,  Mnire's disease, BPPV  Co morbidities that complicate the patient evaluation  See HPI   Additional history obtained:  Additional history obtained from EMR External records from outside source obtained and reviewed including hospital records   Lab Tests:  I Ordered, and personally interpreted labs.  The pertinent results include: No leukocytosis.  Elevation of hemoglobin of 15.5 as well as a thrombocytopenia of 144 which is near patient's baseline.  Mild elevation potassium 5.4 with no secondary EKG findings concerning for hyperkalemia.  No electrolyte abnormalities noted.  Renal function within normal limits.  UA significant for nitrite positive, rare bacteria; patient given 1 dose of Rocephin while in the emergency department with oral outpatient regimen to be continued with already prescribed Keflex; upon chart review, patient had urine cultures responding to  Ancef so Keflex would be appropriate antibiotic.   Imaging Studies ordered:  N/a   Cardiac Monitoring: / EKG:  The patient was maintained on a cardiac monitor.  I personally viewed and interpreted the cardiac monitored which showed an underlying rhythm of: Sinus rhythm without acute ischemic changes   Consultations Obtained:  N/a   Problem List / ED Course / Critical interventions / Medication management  Vertigo I ordered medication including 500 mL of normal saline for rehydration, Rocephin for patient's urinary tract infection, Reglan for peripheral cause of vertigo   Reevaluation of the patient after these medicines showed that the patient improved I have reviewed the patients home medicines and have made adjustments as needed   Social Determinants of Health:  Denies tobacco, licit drug use   Test / Admission - Considered:  Vertigo Vitals signs significant for hypertension with blood pressure 156/69.  Recommend follow-up with primary care regarding elevation blood pressure.. Otherwise within normal range  and stable throughout visit. Laboratory/imaging studies significant for: See above Patient with evidence of peripheral cause of vertigo.  Neurologic exam entirely benign besides rightward beating nystagmus and correction observed during head impulse test indicative of peripheral cause of patient's vertigo type symptoms.  Imaging of the head was held given physical exam findings and evaluation of patient's response to medications.  Patient noted significant improvement of symptoms with Reglan administered while in the emergency department.  Patient given instructions on Epley maneuver as well as given meclizine to take as needed for vertiginous-like symptoms.  Recommend follow-up with primary care regarding patient's symptoms.  Treatment plan discussed at length with patient and she acknowledged understanding was agreeable to said plan. Worrisome signs and symptoms were discussed with the patient, and the patient acknowledged understanding to return to the ED if noticed. Patient was stable upon discharge. '        Final Clinical Impression(s) / ED Diagnoses Final diagnoses:  Vertigo    Rx / DC Orders ED Discharge Orders          Ordered    meclizine (ANTIVERT) 25 MG tablet  3 times daily PRN        10/11/22 1309              Wilnette Kales, [place] 10/11/22 1317    Wilnette Kales, [place] 10/11/22 1320    Fredia Sorrow, MD 10/14/22 1709

## 2022-10-11 NOTE — Discharge Instructions (Addendum)
Note the workup today was overall reassuring.  As discussed, symptoms of vertigo most likely peripheral in nature.  Recommend taking meclizine at home every 8 hours as needed for feelings of dizziness or room spinning type sensation.  Recommend follow-up with primary care for reevaluation of your symptoms.  Please do not hesitate to return to emergency department for worrisome signs and symptoms we discussed become apparent.

## 2022-10-13 LAB — URINE CULTURE: Culture: >=100000 — AB

## 2022-10-14 ENCOUNTER — Telehealth (HOSPITAL_BASED_OUTPATIENT_CLINIC_OR_DEPARTMENT_OTHER): Payer: Self-pay

## 2022-10-14 NOTE — Telephone Encounter (Signed)
Post ED Visit - Positive Culture Follow-up  Culture report reviewed by antimicrobial stewardship pharmacist: [place] Team '[]'$  Elenor Quinones, Pharm.D. '[]'$  Heide Guile, Pharm.D., BCPS AQ-ID '[]'$  Parks Neptune, Pharm.D., BCPS '[]'$  Alycia Rossetti, Pharm.D., BCPS '[]'$  [place], Pharm.D., BCPS, AAHIVP '[]'$  Legrand Como, Pharm.D., BCPS, AAHIVP '[]'$  Salome Arnt, PharmD, BCPS '[]'$  Johnnette Gourd, PharmD, BCPS '[]'$  Hughes Better, PharmD, BCPS '[]'$  Leeroy Cha, PharmD '[]'$  Laqueta Linden, PharmD, BCPS '[]'$  Albertina Parr, PharmD  [place] Team '[x]'$  Dorena Bodo, PharmD '[]'$  Lindell Spar, PharmD '[]'$  Royetta Asal, PharmD '[]'$  Graylin Shiver, Rph '[]'$  Rema Fendt) Glennon Mac, PharmD '[]'$  Arlyn Dunning, PharmD '[]'$  Netta Cedars, PharmD '[]'$  Dia Sitter, PharmD '[]'$  Leone Haven, PharmD '[]'$  Gretta Arab, PharmD '[]'$  Theodis Shove, PharmD '[]'$  Peggyann Juba, PharmD '[]'$  Reuel Boom, PharmD   Positive urine culture Treated with Keflex, organism sensitive to the same and no further patient follow-up is required at this time.  Glennon Hamilton 10/14/2022, 10:14 AM

## 2022-10-15 ENCOUNTER — Other Ambulatory Visit: Payer: Self-pay | Admitting: Radiation Oncology

## 2022-10-15 ENCOUNTER — Telehealth: Payer: Self-pay

## 2022-10-15 MED ORDER — NITROFURANTOIN MONOHYD MACRO 100 MG PO CAPS: 100.0000 mg | ORAL_CAPSULE | Freq: Two times a day (BID) | ORAL | 0 refills | Status: DC

## 2022-10-15 NOTE — Telephone Encounter (Signed)
Patient called in to report having dysuria and a strong vaginal odor. Patient was recently placed on Keflex on 10/10/22  x 7 days for UTI. Patient states she has already completed antibiotics. Patient went into ED on 10/11/22, patient had repeat UA C&S with positive results. Will call [facility] ED to see if new antibiotic can be sent in to patients pharmacy.    Per [employer] nurse [facility] ED no prescription will be sent in unless results are critical. Requesting PCP or other MD call in new antibiotics for patient.

## 2022-10-15 NOTE — Telephone Encounter (Signed)
Call placed to make patient aware of new antibiotic prescription with instructions. Message left

## 2022-10-17 ENCOUNTER — Institutional Professional Consult (permissible substitution): Payer: Medicaid Other

## 2022-10-17 ENCOUNTER — Other Ambulatory Visit: Payer: Self-pay

## 2022-10-17 ENCOUNTER — Ambulatory Visit: Payer: Medicaid Other | Attending: Internal Medicine

## 2022-10-17 DIAGNOSIS — M6281 Muscle weakness (generalized): Secondary | ICD-10-CM | POA: Insufficient documentation

## 2022-10-17 DIAGNOSIS — R279 Unspecified lack of coordination: Secondary | ICD-10-CM | POA: Diagnosis present

## 2022-10-17 DIAGNOSIS — R293 Abnormal posture: Secondary | ICD-10-CM | POA: Diagnosis present

## 2022-10-17 DIAGNOSIS — M62838 Other muscle spasm: Secondary | ICD-10-CM | POA: Diagnosis present

## 2022-10-17 DIAGNOSIS — N390 Urinary tract infection, site not specified: Secondary | ICD-10-CM | POA: Diagnosis not present

## 2022-10-17 DIAGNOSIS — R102 Pelvic and perineal pain: Secondary | ICD-10-CM | POA: Diagnosis present

## 2022-10-17 DIAGNOSIS — N3941 Urge incontinence: Secondary | ICD-10-CM | POA: Insufficient documentation
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
## 2022-10-22 ENCOUNTER — Telehealth: Payer: Self-pay

## 2022-10-22 NOTE — Telephone Encounter (Signed)
Mrs. Kope called and would like to speak to a nurse.  She calls often and she was seen today for a UTI so she most likely will want to talk about this.  She has bladder issues often and she sometimes reports dizziness when she calls. Gardiner Rhyme, RN

## 2022-10-22 NOTE — Telephone Encounter (Signed)
Returned Pts call regarding dizziness. Pt was seen by urology today where no UTI was found but Pt still c/o dizziness. Discussed proper hydration and next B12 inj appt as, per Jordan [name] last note, these are to help with dizziness. Pt verbalized understanding.

## 2022-10-22 NOTE — Telephone Encounter (Signed)
Returned call to Pt and LVM. Call back number given.

## 2022-10-23 ENCOUNTER — Telehealth: Payer: Self-pay

## 2022-10-23 NOTE — Telephone Encounter (Signed)
Returned her call and left a message regarding next appt. Left details of next appt scheduled on 1/9 for b12 injection.

## 2022-10-24 ENCOUNTER — Encounter: Payer: Self-pay | Admitting: General Practice

## 2022-10-24 ENCOUNTER — Telehealth: Payer: Self-pay

## 2022-10-24 ENCOUNTER — Institutional Professional Consult (permissible substitution): Payer: Medicaid Other

## 2022-10-24 ENCOUNTER — Ambulatory Visit: Payer: Medicaid Other | Attending: Internal Medicine

## 2022-10-24 DIAGNOSIS — M6281 Muscle weakness (generalized): Secondary | ICD-10-CM | POA: Diagnosis present

## 2022-10-24 DIAGNOSIS — R293 Abnormal posture: Secondary | ICD-10-CM | POA: Diagnosis present

## 2022-10-24 DIAGNOSIS — R102 Pelvic and perineal pain: Secondary | ICD-10-CM | POA: Insufficient documentation

## 2022-10-24 DIAGNOSIS — N3941 Urge incontinence: Secondary | ICD-10-CM | POA: Insufficient documentation

## 2022-10-24 DIAGNOSIS — R278 Other lack of coordination: Secondary | ICD-10-CM | POA: Diagnosis present

## 2022-10-24 DIAGNOSIS — R279 Unspecified lack of coordination: Secondary | ICD-10-CM | POA: Diagnosis present

## 2022-10-24 DIAGNOSIS — M62838 Other muscle spasm: Secondary | ICD-10-CM | POA: Diagnosis present

## 2022-10-24 NOTE — Progress Notes (Signed)
[facility] Note  Received and returned voicemail from [place].   [place], [place], [facility] Pager [phone] Voicemail [phone]

## 2022-10-24 NOTE — Telephone Encounter (Signed)
Patient called in very tearful and upset stating that she continues to have dysuria and a foul  vaginal odor after completing antibiotics. Patient was seen by urology on 10/22/21, per patient she was told by urology that dysuria and odor was caused by radiation treatment. Urology collected UA.  Patient states she is staying well hydrated and took most recent antibiotics as directed. Patient requesting to be seen by Dr. Sondra Come. Please advise.

## 2022-10-24 NOTE — Telephone Encounter (Signed)
Returned her call. She is asking to move b12 injection appt to tomorrow. Appt moved to 2:30 pm and she is aware. She is complaining of dizziness and feeling light headed. She feels that she is drinking lots of fluids. She is asking if labs could be moved to tomorrow prior to injection?  Instructed to follow up with PCP regarding dizziness/ and feeling light headed. She states that she may but that have not been helpful.  She recently saw urology and does not have UTI. But is still having symptoms of UTI. Urology told her that the symptoms are probably from radiation. Instructed her to call Dr. Lois Huxley office to follow up. She verbalized understanding.
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
## 2022-10-25 ENCOUNTER — Inpatient Hospital Stay: Payer: Medicaid Other | Attending: Radiation Oncology

## 2022-10-25 ENCOUNTER — Other Ambulatory Visit: Payer: Self-pay

## 2022-10-25 ENCOUNTER — Other Ambulatory Visit (HOSPITAL_COMMUNITY): Payer: Self-pay

## 2022-10-25 ENCOUNTER — Encounter: Payer: Self-pay | Admitting: General Practice

## 2022-10-25 ENCOUNTER — Telehealth: Payer: Self-pay

## 2022-10-25 ENCOUNTER — Inpatient Hospital Stay: Payer: Medicaid Other

## 2022-10-25 ENCOUNTER — Encounter: Payer: Self-pay | Admitting: Hematology and Oncology

## 2022-10-25 DIAGNOSIS — E538 Deficiency of other specified B group vitamins: Secondary | ICD-10-CM

## 2022-10-25 DIAGNOSIS — D649 Anemia, unspecified: Secondary | ICD-10-CM | POA: Insufficient documentation

## 2022-10-25 DIAGNOSIS — R42 Dizziness and giddiness: Secondary | ICD-10-CM | POA: Insufficient documentation

## 2022-10-25 DIAGNOSIS — Z8544 Personal history of malignant neoplasm of other female genital organs: Secondary | ICD-10-CM | POA: Insufficient documentation

## 2022-10-25 LAB — CBC WITH DIFFERENTIAL/PLATELET
Abs Immature Granulocytes: 0 10*3/uL (ref 0.00–0.07)
Basophils Absolute: 0 10*3/uL (ref 0.0–0.1)
Basophils Relative: 0 %
Eosinophils Absolute: 0 10*3/uL (ref 0.0–0.5)
Eosinophils Relative: 0 %
HCT: 43.4 % (ref 36.0–46.0)
Hemoglobin: 15 g/dL (ref 12.0–15.0)
Immature Granulocytes: 0 %
Lymphocytes Relative: 32 %
Lymphs Abs: 1.4 10*3/uL (ref 0.7–4.0)
MCH: 31.8 pg (ref 26.0–34.0)
MCHC: 34.6 g/dL (ref 30.0–36.0)
MCV: 91.9 fL (ref 80.0–100.0)
Monocytes Absolute: 0.4 10*3/uL (ref 0.1–1.0)
Monocytes Relative: 9 %
Neutro Abs: 2.5 10*3/uL (ref 1.7–7.7)
Neutrophils Relative %: 59 %
Platelets: 126 10*3/uL — ABNORMAL LOW (ref 150–400)
RBC: 4.72 MIL/uL (ref 3.87–5.11)
RDW: 11.6 % (ref 11.5–15.5)
WBC: 4.3 10*3/uL (ref 4.0–10.5)
nRBC: 0 % (ref 0.0–0.2)

## 2022-10-25 LAB — VITAMIN B12: Vitamin B-12: >7500 pg/mL — ABNORMAL HIGH (ref 180–914)

## 2022-10-25 MED ORDER — CYANOCOBALAMIN 1000 MCG/ML IJ SOLN
1000.0000 ug | Freq: Once | INTRAMUSCULAR | Status: AC
  Administered 2022-10-25: 1000 ug via INTRAMUSCULAR
  Filled 2022-10-25: qty 1

## 2022-10-25 MED ORDER — CEPHALEXIN 250 MG PO CAPS
250.0000 mg | ORAL_CAPSULE | Freq: Every day | ORAL | 3 refills | Status: DC
  Filled 2022-10-25: qty 15, 15d supply, fill #0
  Filled 2022-10-25: qty 75, 75d supply, fill #0

## 2022-10-25 NOTE — Telephone Encounter (Signed)
Returned her call and left a message. Left appt times for today's appt. Lab appt added after b12 injection at 3 pm.

## 2022-10-25 NOTE — Telephone Encounter (Signed)
Called made to patient to advise her to increase water intake and take Keflex as directed by Dr. Amalia Hailey. Patient states, " I do not have money or a ride to pick up my prescription."  Noted that patient had appointment [facility] today @ 230pm , called [facility] and requested prescription  be sent to [facility] outpatient pharmacy. Picked up 15 tablets of Keflex from [facility] , remaining 75 capsules to be mailed directly to patient.

## 2022-10-25 NOTE — Progress Notes (Signed)
[facility] Spiritual Care Note  Received call from [place] to process her worry about health concerns, including dizziness, and her grief as she approaches her 18th anniversary, this year for the first time without her boyfriend Merry Proud.  Provided empathic listening, emotional support, normalization of feelings, and education about the process and duration of adjusting to grief.   [place], [place], [facility] Pager [phone] Voicemail [phone]

## 2022-10-25 NOTE — Patient Instructions (Signed)
Vitamin B12 Deficiency Vitamin B12 deficiency occurs when the body does not have enough of this important vitamin. The body needs this vitamin: To make red blood cells. To make DNA. This is the genetic material inside cells. To help the nerves work properly so they can carry messages from the brain to the body. Vitamin B12 deficiency can cause health problems, such as not having enough red blood cells in the blood (anemia). This can lead to nerve damage if untreated. What are the causes? This condition may be caused by: Not eating enough foods that contain vitamin B12. Not having enough stomach acid and digestive fluids to properly absorb vitamin B12 from the food that you eat. Having certain diseases that make it hard to absorb vitamin B12. These diseases include Crohn's disease, chronic pancreatitis, and cystic fibrosis. An autoimmune disorder in which the body does not make enough of a protein (intrinsic factor) within the stomach, resulting in not enough absorption of vitamin B12. Having a surgery in which part of the stomach or small intestine is removed. Taking certain medicines that make it hard for the body to absorb vitamin B12. These include: Heartburn medicines, such as antacids and proton pump inhibitors. Some medicines that are used to treat diabetes. What increases the risk? The following factors may make you more likely to develop a vitamin B12 deficiency: Being an older adult. Eating a vegetarian or vegan diet that does not include any foods that come from animals. Eating a poor diet while you are pregnant. Taking certain medicines. Having alcoholism. What are the signs or symptoms? In some cases, there are no symptoms of this condition. If the condition leads to anemia or nerve damage, various symptoms may occur, such as: Weakness. Tiredness (fatigue). Loss of appetite. Numbness or tingling in your hands and feet. Redness and burning of the tongue. Depression,  confusion, or memory problems. Trouble walking. If anemia is severe, symptoms can include: Shortness of breath. Dizziness. Rapid heart rate. How is this diagnosed? This condition may be diagnosed with a blood test to measure the level of vitamin B12 in your blood. You may also have other tests, including: A group of tests that measure certain characteristics of blood cells (complete blood count, CBC). A blood test to measure intrinsic factor. A procedure where a thin tube with a camera on the end is used to look into your stomach or intestines (endoscopy). Other tests may be needed to discover the cause of the deficiency. How is this treated? Treatment for this condition depends on the cause. This condition may be treated by: Changing your eating and drinking habits, such as: Eating more foods that contain vitamin B12. Drinking less alcohol or no alcohol. Getting vitamin B12 injections. Taking vitamin B12 supplements by mouth (orally). Your health care provider will tell you which dose is best for you. Follow these instructions at home: Eating and drinking  Include foods in your diet that come from animals and contain a lot of vitamin B12. These include: Meats and poultry. This includes beef, pork, chicken, turkey, and organ meats, such as liver. Seafood. This includes clams, rainbow trout, salmon, tuna, and haddock. Eggs. Dairy foods such as milk, yogurt, and cheese. Eat foods that have vitamin B12 added to them (are fortified), such as ready-to-eat breakfast cereals. Check the label on the package to see if a food is fortified. The items listed above may not be a complete list of foods and beverages you can eat and drink. Contact a dietitian for   more information. Alcohol use Do not drink alcohol if: Your health care provider tells you not to drink. You are pregnant, may be pregnant, or are planning to become pregnant. If you drink alcohol: Limit how much you have to: 0-1 drink a  day for women. 0-2 drinks a day for men. Know how much alcohol is in your drink. In the [place] one drink equals one 12 oz bottle of beer (355 mL), one 5 oz glass of wine (148 mL), or one 1 oz glass of hard liquor (44 mL). General instructions Get vitamin B12 injections if told to by your health care provider. Take supplements only as told by your health care provider. Follow the directions carefully. Keep all follow-up visits. This is important. Contact a health care provider if: Your symptoms come back. Your symptoms get worse or do not improve with treatment. Get help right away: You develop shortness of breath. You have a rapid heart rate. You have chest pain. You become dizzy or you faint. These symptoms may be an emergency. Get help right away. Call 911. Do not wait to see if the symptoms will go away. Do not drive yourself to the hospital. Summary Vitamin B12 deficiency occurs when the body does not have enough of this important vitamin. Common causes include not eating enough foods that contain vitamin B12, not being able to absorb vitamin B12 from the food that you eat, having a surgery in which part of the stomach or small intestine is removed, or taking certain medicines. Eat foods that have vitamin B12 in them. Treatment may include making a change in the way you eat and drink, getting vitamin B12 injections, or taking vitamin B12 supplements. This information is not intended to replace advice given to you by your health care provider. Make sure you discuss any questions you have with your health care provider. Document Revised: 06/01/2021 Document Reviewed: 06/01/2021 Elsevier Patient Education  2023 [employer] Inc.  

## 2022-10-26 ENCOUNTER — Other Ambulatory Visit (HOSPITAL_COMMUNITY): Payer: Self-pay

## 2022-10-28 ENCOUNTER — Encounter (HOSPITAL_COMMUNITY): Payer: Self-pay

## 2022-10-28 ENCOUNTER — Telehealth: Payer: Self-pay

## 2022-10-28 ENCOUNTER — Other Ambulatory Visit (HOSPITAL_COMMUNITY): Payer: Self-pay

## 2022-10-28 NOTE — Telephone Encounter (Signed)
Called and left message asking her to call the office back. °

## 2022-10-28 NOTE — Telephone Encounter (Signed)
Patient called in to report increased dizziness and she continues to have a strong  vaginal odor. Patient reports she stopped taking Keflex due to increased diarrhea. Encouraged patient to follow up with PCP.

## 2022-10-28 NOTE — Telephone Encounter (Signed)
Patient called back to report severe pain to pelvis. Encouraged patient to go to ED for evaluation. Patient states, " I do not want to wait for a long time in the waiting room at the ED."  Patient states she has a follow up appointment with her PCP tomorrow.

## 2022-10-28 NOTE — Telephone Encounter (Signed)
-----   Message from Heath Lark, MD sent at 10/28/2022  8:04 AM EST ----- Her B12 is very high PLease cancel her appt in Feb and reschedule to 6 months from now  for labs, see me and B12 inj

## 2022-10-28 NOTE — Telephone Encounter (Signed)
She called back. Given below message. She verbalized understanding. Rescheduled appts out 6 months and will mail a schedule to her address per her request.

## 2022-10-29 ENCOUNTER — Inpatient Hospital Stay: Payer: Medicaid Other

## 2022-10-30 ENCOUNTER — Telehealth: Payer: Self-pay | Admitting: Family Medicine

## 2022-10-30 ENCOUNTER — Institutional Professional Consult (permissible substitution): Payer: Medicaid Other

## 2022-10-30 NOTE — Telephone Encounter (Signed)
Itchiness, burning and a smell in vaginal area, would like a nurse to call back

## 2022-10-31 ENCOUNTER — Other Ambulatory Visit: Payer: Self-pay

## 2022-10-31 ENCOUNTER — Other Ambulatory Visit (HOSPITAL_COMMUNITY)
Admission: RE | Admit: 2022-10-31 | Discharge: 2022-10-31 | Disposition: A | Discharge status: Home / Self Care | Payer: Medicaid Other | Source: Ambulatory Visit | Attending: Family Medicine | Admitting: Family Medicine

## 2022-10-31 ENCOUNTER — Ambulatory Visit (INDEPENDENT_AMBULATORY_CARE_PROVIDER_SITE_OTHER): Payer: Medicaid Other

## 2022-10-31 ENCOUNTER — Ambulatory Visit: Payer: Medicaid Other | Admitting: Clinical

## 2022-10-31 VITALS — BP 117/65 | HR 80

## 2022-10-31 DIAGNOSIS — R300 Dysuria: Secondary | ICD-10-CM | POA: Diagnosis not present

## 2022-10-31 DIAGNOSIS — N898 Other specified noninflammatory disorders of vagina: Secondary | ICD-10-CM | POA: Insufficient documentation

## 2022-10-31 DIAGNOSIS — N949 Unspecified condition associated with female genital organs and menstrual cycle: Secondary | ICD-10-CM | POA: Insufficient documentation

## 2022-10-31 DIAGNOSIS — F4321 Adjustment disorder with depressed mood: Secondary | ICD-10-CM

## 2022-10-31 LAB — POCT URINALYSIS DIP (DEVICE)
Bilirubin Urine: NEGATIVE
Glucose, UA: NEGATIVE mg/dL
Nitrite: POSITIVE — AB
Protein, ur: NEGATIVE mg/dL
Specific Gravity, Urine: 1.025 (ref 1.005–1.030)
Urobilinogen, UA: 1 mg/dL (ref 0.0–1.0)
pH: 6 (ref 5.0–8.0)

## 2022-10-31 NOTE — BH Specialist Note (Signed)
Integrated Behavioral Health Follow Up In-Person Visit  MRN: [id number] Name: Jordan [name]  Number of Lane Clinician visits: 4- Fourth Visit  Session Start time: 9417   Session End time: 4081  Total time in minutes: 70   Types of Service: Individual psychotherapy  I reviewed patient visit with the [facility] Intern, and I concur with the treatment plan, as documented in the [facility] Intern note.   No charge for this visit due to [facility] Intern seeing patient.  Vesta Mixer, MSW, [place] for [facility] at [facility]    Subjective: Jordan [name] is a 57 y.o. female accompanied  Patient was referred by Arlina Robes, MD  for Grief . Patient reports the following symptoms/concerns: Death of long term partner  Duration of problem: ongoing ; Severity of problem: moderate  Objective: Mood: Anxious and Irritable and Affect: Appropriate Risk of harm to self or others: No plan to harm self or others  Patient and/or Family's Strengths/Protective Factors: Social connections, Concrete supports in place (healthy food, safe environments, etc.), and Sense of purpose  Goals Addressed: Patient will:  Reduce symptoms of: anxiety, depression, mood instability, and stress   Increase knowledge and/or ability of: coping skills, healthy habits, and stress reduction   Demonstrate ability to: Increase healthy adjustment to current life circumstances, Increase adequate support systems for patient/family, and Begin healthy grieving over loss  Progress towards Goals: Ongoing  Interventions: Interventions utilized:  Solution-Focused Strategies, Supportive Counseling, Psychoeducation and/or Health Education, and Supportive Reflection Standardized Assessments completed: Not Needed  Patient and/or Family Response: Pt agrees with treatment plan  Plan: Follow up with behavioral health clinician on :  11/21/22 Behavioral recommendations: -Continue prioritizing healthy self-care (regular meals, adequate rest; allowing practical help from supportive friends and family)   - Designer, television/film set to inquire about federally funded housing programs Referral(s): [place] (In Clinic)  [place]

## 2022-10-31 NOTE — Telephone Encounter (Signed)
Called pt; VM left with callback number and offer for patient to sent MyChart message.

## 2022-11-01 ENCOUNTER — Other Ambulatory Visit (HOSPITAL_COMMUNITY): Payer: Self-pay

## 2022-11-01 LAB — CERVICOVAGINAL ANCILLARY ONLY
Bacterial Vaginitis (gardnerella): NEGATIVE
Candida Glabrata: NEGATIVE
Candida Vaginitis: NEGATIVE
Comment: NEGATIVE
Comment: NEGATIVE
Comment: NEGATIVE

## 2022-11-01 NOTE — Progress Notes (Signed)
Jordan [name] is here today for behavioral health visit and requests nurse visit due to complaint of vaginal burning and abnormal vaginal odor. Patient reports chronic UTI for 2 years. States these symptoms are new within the last few weeks and she is unsure if related to UTI or past history of radiation; last radiation treatment 09/22/2019. Patient has contacted radiation oncology provider who states that this is not related to radiation. Pt is no longer taking antibiotics for chronic UTI due to diarrhea and feeling these do not work. Patient is very concerned that something may be wrong. States she PCP declined to do exam.  Clean catch urine and self swab instructions given and specimens obtained. Explained patient will be contacted with any abnormal results. Patient will return on 12/02/22 for gyn exam with Arlina Robes, MD; I emphasized that physical exam can be done that day by provider.  Annabell Howells, RN 11/01/2022

## 2022-11-02 LAB — URINE CULTURE

## 2022-11-05 ENCOUNTER — Telehealth: Payer: Self-pay

## 2022-11-05 NOTE — Telephone Encounter (Addendum)
-----  Message from Chancy Milroy, MD sent at 11/04/2022  8:47 AM EST ----- Please let pt know that she has a UTI. Send in Rx for Keflex 500 mg po bid x 10 days Send in Rx for Macrobid 1 po qhs x 21 days to start after Keflex is completed Send in Rx for Uribel 1 po bid x 7 days Thanks Legrand Como   Patient called into office and spoke with Ivin Booty, RN regarding results. Call was routed to [facility] MD for further clarification. See telephone note 11/06/22.

## 2022-11-06 ENCOUNTER — Telehealth: Payer: Self-pay | Admitting: Family Medicine

## 2022-11-06 ENCOUNTER — Other Ambulatory Visit: Payer: Self-pay | Admitting: *Deleted

## 2022-11-06 ENCOUNTER — Telehealth: Payer: Self-pay | Admitting: *Deleted

## 2022-11-06 DIAGNOSIS — E538 Deficiency of other specified B group vitamins: Secondary | ICD-10-CM

## 2022-11-06 MED ORDER — NITROFURANTOIN MONOHYD MACRO 100 MG PO CAPS: 100.0000 mg | ORAL_CAPSULE | Freq: Every day | ORAL | 0 refills | Status: DC

## 2022-11-06 MED ORDER — CEPHALEXIN 500 MG PO CAPS: 500.0000 mg | ORAL_CAPSULE | Freq: Two times a day (BID) | ORAL | 0 refills | Status: AC

## 2022-11-06 MED ORDER — URIBEL 81.6 MG PO TABS: 1.0000 | ORAL_TABLET | Freq: Two times a day (BID) | ORAL | 0 refills | Status: AC

## 2022-11-06 NOTE — Telephone Encounter (Signed)
Called patient to reviewed rest results and medication management. Patient did not answer. LM for her to call the office at [phone] to discuss results and medications.

## 2022-11-06 NOTE — Telephone Encounter (Signed)
Pt called with c/o dizziness for 2-3 days with no relief after taking prn medication. Advised pt on hydrating and lying still and until dizziness is better. Pt request to be seen as soon as possible. Schedule pt for [facility] and called and left message for scheduled appt. Advised to call for questions.

## 2022-11-06 NOTE — Telephone Encounter (Signed)
Patient called back. She was advised that she has an [place] UTI. Reviewed medications with patient. She reports she has to wait until 01/26 to get the medications as she has no money to pick up currently.   Patient is concerned the pills will not take care of infection as Dr. Sondra Come usually does IV treatment. Patient would prefer IV medication. Reviewed that Dr. Rip Harbour recommends oral medication. Will route to Dr. Rip Harbour for advisement.

## 2022-11-06 NOTE — Telephone Encounter (Signed)
Patient called in wanting to know her test results.

## 2022-11-07 NOTE — Telephone Encounter (Signed)
Spoke with Dr. Rip Harbour who does not advise IV antibiotics at this time. He also recommended patient come in after ATB completed to have a follow up urine culture.   Patient did not answer, LM on voicemail what Dr. Rip Harbour recommends. LM for patient to call the office with any questions or concerns. My Chart message sent.

## 2022-11-08 ENCOUNTER — Inpatient Hospital Stay: Payer: Medicaid Other

## 2022-11-08 ENCOUNTER — Inpatient Hospital Stay (HOSPITAL_BASED_OUTPATIENT_CLINIC_OR_DEPARTMENT_OTHER): Payer: Medicaid Other | Admitting: Physician Assistant

## 2022-11-08 ENCOUNTER — Inpatient Hospital Stay: Payer: Medicaid Other | Admitting: *Deleted

## 2022-11-08 ENCOUNTER — Other Ambulatory Visit: Payer: Self-pay

## 2022-11-08 VITALS — BP 118/71 | HR 81 | Resp 16 | Wt 237.9 lb

## 2022-11-08 DIAGNOSIS — E538 Deficiency of other specified B group vitamins: Secondary | ICD-10-CM

## 2022-11-08 DIAGNOSIS — R42 Dizziness and giddiness: Secondary | ICD-10-CM

## 2022-11-08 LAB — CBC WITH DIFFERENTIAL (CANCER CENTER ONLY)
Abs Immature Granulocytes: 0.01 10*3/uL (ref 0.00–0.07)
Basophils Absolute: 0 10*3/uL (ref 0.0–0.1)
Basophils Relative: 0 %
Eosinophils Absolute: 0 10*3/uL (ref 0.0–0.5)
Eosinophils Relative: 0 %
HCT: 42 % (ref 36.0–46.0)
Hemoglobin: 14.4 g/dL (ref 12.0–15.0)
Immature Granulocytes: 0 %
Lymphocytes Relative: 22 %
Lymphs Abs: 1 10*3/uL (ref 0.7–4.0)
MCH: 31.3 pg (ref 26.0–34.0)
MCHC: 34.3 g/dL (ref 30.0–36.0)
MCV: 91.3 fL (ref 80.0–100.0)
Monocytes Absolute: 0.5 10*3/uL (ref 0.1–1.0)
Monocytes Relative: 10 %
Neutro Abs: 3.2 10*3/uL (ref 1.7–7.7)
Neutrophils Relative %: 68 %
Platelet Count: 140 10*3/uL — ABNORMAL LOW (ref 150–400)
RBC: 4.6 MIL/uL (ref 3.87–5.11)
RDW: 11.8 % (ref 11.5–15.5)
WBC Count: 4.7 10*3/uL (ref 4.0–10.5)
nRBC: 0 % (ref 0.0–0.2)

## 2022-11-08 LAB — CMP (CANCER CENTER ONLY)
ALT: 27 U/L (ref 0–44)
AST: 24 U/L (ref 15–41)
Albumin: 3.8 g/dL (ref 3.5–5.0)
Alkaline Phosphatase: 127 U/L — ABNORMAL HIGH (ref 38–126)
Anion gap: 4 — ABNORMAL LOW (ref 5–15)
BUN: 16 mg/dL (ref 6–20)
CO2: 30 mmol/L (ref 22–32)
Calcium: 9.3 mg/dL (ref 8.9–10.3)
Chloride: 106 mmol/L (ref 98–111)
Creatinine: 0.62 mg/dL (ref 0.44–1.00)
GFR, Estimated: >60 mL/min (ref >60)
Glucose, Bld: 82 mg/dL (ref 70–99)
Potassium: 4.9 mmol/L (ref 3.5–5.1)
Sodium: 140 mmol/L (ref 135–145)
Total Bilirubin: 0.6 mg/dL (ref 0.3–1.2)
Total Protein: 6.2 g/dL — ABNORMAL LOW (ref 6.5–8.1)

## 2022-11-08 MED ORDER — SODIUM CHLORIDE 0.9 % IV SOLN: Freq: Once | INTRAVENOUS | Status: AC

## 2022-11-08 NOTE — Progress Notes (Signed)
Symptom Management Consult note Jordan [name]    Patient Care Team: Jordan Huh, NP as PCP - General (Nurse Practitioner)    Name of the patient: Jordan [name]  [id number]  [birth year]   Date of visit: 11/08/2022   Chief Complaint/Reason for visit: dizziness   Current Therapy: B12 injections    ASSESSMENT & PLAN: Patient is a 57 y.o. 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 Alvy Bimler. .  I have viewed most recent oncology note, ED note and lab work.    #) B12 deficiency and anemia -Chart review shows last B12 level was elevated and plan was to repeat lab in 6 months -CBC today without anemia. - Next appointment with oncologist is 04/29/23   #) Dizziness -Patient has been seen by me for this before. Dizziness is chronic. -Basic labs checked today and are overall unremarkable.  -Patient has non focal neuro exam.  No orthostatic hypotension. -Patient was seen by pcp for this recently and is waiting on results from her outpatient head CT.  As patient's PCP is at [place] I am unable to see those results in our system.  I encouraged patient to continue to follow up with pcp for management on this chronic condition. I discussed symptoms that would warrant ED evaluation. -Patient given 1L NS for hydration and encouraged to stay well hydrated at home.     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  This revealed that she was status post hysterectomy with no evidence of metastatic disease in the abdomen or pelvis or chest.   06/26/2019 Imaging   MRI of the pelvis with IV contrast: susceptibility artifact in the vaginal cuff from suture material limiting evaluation.  However  no focal soft tissue masses were seen involving the vaginal cuff and no abnormal soft tissue densities in the parametrial regions.  The right and left ovaries were grossly normal.   07/22/2019 Surgery   Robotic assisted radical upper vaginectomy with bilateral pelvic sentinel lymph node biopsy, right salpingo-oophorectomy, left salpingectomy.  Intraoperative findings were significant for no grossly visible vaginal lesions.  The ovaries and fallopian tubes were adherent to the rectum and vaginal cuff in the obliterated cul-de-sac.  The ovaries themselves are grossly normal-appearing but densely adherent with no suspicious lymph nodes.  There is clinical stage I disease, microscopic.   07/22/2019 Cancer Staging   Postoperatively final pathology returned as FIGO stage Ia microscopic poorly differentiated squamous cell carcinoma.  The tumor site was the posterior vaginal wall and measured 0.6 cm.  The posterior margin (the posterior vaginal cuff margin) was broadly positive for tumor cells.  The deep margin was negative.  There was 2.5 mm depth of invasion.  All sentinel lymph nodes were negative for metastases, as was the left fallopian tube and right tube and ovary.   08/24/2019 - 09/22/2019 Radiation Therapy   Vaginal brachytherapy   09/15/2020 PET scan   Soft tissue attenuating structure is noted within the left pelvis measuring 1.7 cm. This was favored to be a left ovary. No clear evidence for recurrence.    01/01/2021 Imaging   Pelvic MRI 1. No evidence of local tumor recurrence at the vaginal resection  margin. 2. No evidence of metastatic disease in the pelvis. 3. Left adnexal 2.3 x 1.6 x 1.6 cm structure is compatible with an atrophic left ovary, unchanged from 08/31/2020 outside CT. 4. Chronic mild diffuse bladder wall thickening, unchanged, potentially the sequela of prior radiation therapy       Interval history-: Jordan [name] is a 57 y.o. female with oncologic history as above  presenting to [facility] today with chief complaint of dizziness x several years. She presents unaccompanied to today's visit.   Patient reports her dizziness that she has been recently experiencing is unchanged from the past.  She states the dizziness is intermittent.  Sometimes it is worse when changing positions however that is not always the case.  Patient states she has been seeing her PCP for evaluation of her dizziness.  He went to the ED on 10/11/2022 for dizziness and was prescribed meclizine.  Patient states she did not take this because she wants the answered to why she is dizzy not just the treatment for it.  She denies any recent falls.  She does admit that over the last several days her fluid intake has been lower than usual.  She has not taken any recent over-the-counter medications.  Denies any fever, chills, headache, visual changes, numbness or tingling.  Denies any drug or alcohol use.    ROS  All other systems are reviewed and are negative for acute change except as noted in the HPI.    Allergies  Allergen Reactions   Ciprofloxacin Swelling    Lips swell, tongue swells, face swells   Citalopram Other (See Comments)    Possible cause of pancytopenia. Swelling of tongue, face and throat   Lamotrigine Other (See Comments)    Possible cause of pancytopenia. Swelling of face, throat and tongue   Sulfa Antibiotics Anaphylaxis   Tramadol Anaphylaxis, Shortness Of Breath and Swelling     Past Medical History:  Diagnosis Date   Alcohol abuse    Anemia    patient denies   Bipolar 1 disorder ([place])    No medications currently   CAP (community acquired pneumonia) 03/17/2015   History of radiation therapy 08/24/19-09/22/19   Vaginal brachytherapy   Dr. 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   Mental disorder    Obesity    PICC line infection 05/17/2015   Sepsis due to Gram negative bacteria (MDR E Coli) 02/18/2015   Squamous cell carcinoma of  vagina (HCC)    UTI (lower urinary tract infection)    Vaginal Pap smear, abnormal      Past Surgical History:  Procedure Laterality Date   ABDOMINAL HYSTERECTOMY     CERVICAL CONIZATION W/BX N/A 07/01/2016   Procedure: CONIZATION CERVIX WITH BIOPSY;  Surgeon: Chancy Milroy, MD;  Location: [facility];  Service: Gynecology;  Laterality: N/A;   CHOLECYSTECTOMY     EXTERNAL FIXATION LEG Right 04/09/2020   Procedure: EXTERNAL FIXATION LEG;  Surgeon: Hiram Gash, MD;  Location: [facility];  Service: Orthopedics;  Laterality: Right;   HYSTEROSCOPY WITH D & C N/A 01/25/2016   Procedure: DILATATION AND CURETTAGE /HYSTEROSCOPY;  Surgeon: Mora Bellman, MD;  Location: [facility];  Service: Gynecology;  Laterality: N/A;   LYMPH NODE BIOPSY Bilateral 07/22/2019   Procedure: LYMPH NODE BIOPSY;  Surgeon: Everitt Amber, MD;  Location: [facility];  Service: Gynecology;  Laterality: Bilateral;   OPEN REDUCTION INTERNAL FIXATION (ORIF) TIBIA/FIBULA FRACTURE Right 04/10/2020   Procedure: OPEN REDUCTION INTERNAL FIXATION (  ORIF) TIBIA/FIBULA FRACTURE;  Surgeon: Shona Needles, MD;  Location: [facility];  Service: Orthopedics;  Laterality: Right;   ROBOT ASSISTED MYOMECTOMY N/A 07/22/2019   Procedure: XI ROBOTIC ASSISTED LAPAROSCOPIC RADICAL UPPER VAGINECTOMY, LEFT SALPINECTOMY, RIGHT SALPINGOOOPHERECTOMY;  Surgeon: Everitt Amber, MD;  Location: [facility];  Service: Gynecology;  Laterality: N/A;   VAGINAL HYSTERECTOMY N/A 03/11/2017   Procedure: HYSTERECTOMY VAGINAL WITH MORCELLATION;  Surgeon: Chancy Milroy, MD;  Location: [facility];  Service: Gynecology;  Laterality: N/A;    Social History   Socioeconomic History   Marital status: Soil scientist    Spouse name: Not on file   Number of children: Not on file   Years of education: Not on file   Highest education level: Not on file  Occupational History   Not on file  Tobacco Use   Smoking status: Never   Smokeless tobacco: Never  Vaping Use   Vaping Use: Never used  Substance  and Sexual Activity   Alcohol use: Not Currently   Drug use: No   Sexual activity: Yes    Birth control/protection: Post-menopausal, Surgical    Comment: perimenopausal; no sex in years  Other Topics Concern   Not on file  Social History Narrative   Not on file   Social Determinants of Health   Financial Resource Strain: Not on file  Food Insecurity: No Food Insecurity (08/19/2022)   Hunger Vital Sign    Worried About Running Out of Food in the Last Year: Never true    Ran Out of Food in the Last Year: Never true  Recent Concern: Food Insecurity - Food Insecurity Present (08/18/2022)   Hunger Vital Sign    Worried About Running Out of Food in the Last Year: Sometimes true    Ran Out of Food in the Last Year: Sometimes true  Transportation Needs: Unmet Transportation Needs (11/06/2022)   PRAPARE - Hydrologist (Medical): Yes    Lack of Transportation (Non-Medical): Yes  Physical Activity: Not on file  Stress: Not on file  Social Connections: Not on file  Intimate Partner Violence: Not At Risk (08/18/2022)   Humiliation, Afraid, Rape, and Kick questionnaire    Fear of Current or Ex-Partner: No    Emotionally Abused: No    Physically Abused: No    Sexually Abused: No    Family History  Problem Relation Age of Onset   Alcohol abuse Father    Cancer Father    Breast cancer Neg Hx      Current Outpatient Medications:    cephALEXin (KEFLEX) 500 MG capsule, Take 1 capsule (500 mg total) by mouth 2 (two) times daily for 10 days., Disp: 20 capsule, Rfl: 0   cyanocobalamin (VITAMIN B12) 1000 MCG/ML injection, Inject 1 mL (1,000 mcg total) into the muscle every 7 (seven) days., Disp: 1 mL, Rfl: 0   gabapentin (NEURONTIN) 800 MG tablet, Take 800 mg by mouth 3 (three) times daily., Disp: , Rfl:    meclizine (ANTIVERT) 25 MG tablet, Take 1 tablet (25 mg total) by mouth 3 (three) times daily as needed for dizziness., Disp: 30 tablet, Rfl: 0   Meth-Hyo-M  Bl-Benz Acd-Ph Sal (URIBEL) 81.6 MG TABS, Take 1 tablet (81.6 mg total) by mouth 2 (two) times daily for 7 days., Disp: 14 tablet, Rfl: 0   nitrofurantoin, macrocrystal-monohydrate, (MACROBID) 100 MG capsule, Take 1 capsule (100 mg total) by mouth at bedtime. Start taking after Keflex prescription is completed., Disp: 21 capsule, Rfl:  0   oxybutynin (DITROPAN XL) 15 MG 24 hr tablet, Take 15 mg by mouth 2 (two) times daily., Disp: , Rfl:    Oxycodone HCl 10 MG TABS, Take 10 mg by mouth daily as needed., Disp: , Rfl:  No current facility-administered medications for this visit.  Facility-Administered Medications Ordered in Other Visits:    0.9 %  sodium chloride infusion, , Intravenous, Once, Gorsuch, Ni, MD  PHYSICAL EXAM: ECOG FS:1 - Symptomatic but completely ambulatory    Vitals:   11/08/22 1236 11/08/22 1451  BP:  118/71  Pulse:  81  Resp:  16  SpO2:  100%  Weight: 237 lb 14.4 oz (107.9 kg)    Physical Exam Vitals and nursing note reviewed.  Constitutional:      Appearance: She is well-developed. She is not ill-appearing or toxic-appearing.  HENT:     Head: Normocephalic.     Nose: Nose normal.  Eyes:     Conjunctiva/sclera: Conjunctivae normal.  Neck:     Vascular: No JVD.  Cardiovascular:     Rate and Rhythm: Normal rate and regular rhythm.     Pulses: Normal pulses.     Heart sounds: Normal heart sounds.  Pulmonary:     Effort: Pulmonary effort is normal.     Breath sounds: Normal breath sounds.  Abdominal:     General: There is no distension.  Musculoskeletal:     Cervical back: Normal range of motion.  Skin:    General: Skin is warm and dry.  Neurological:     Mental Status: She is oriented to person, place, and time.        LABORATORY DATA: I have reviewed the data as listed    Latest Ref Rng & Units 11/08/2022   11:47 AM 10/25/2022    3:32 PM 10/11/2022   11:12 AM  CBC  WBC 4.0 - 10.5 K/uL 4.7  4.3  4.5   Hemoglobin 12.0 - 15.0 g/dL 14.4  15.0  15.5    Hematocrit 36.0 - 46.0 % 42.0  43.4  46.7   Platelets 150 - 400 K/uL 140  126  144         Latest Ref Rng & Units 11/08/2022   11:47 AM 10/11/2022   11:12 AM 08/28/2022   12:53 PM  CMP  Glucose 70 - 99 mg/dL 82  91  91   BUN 6 - 20 mg/dL '16  12  7   '$ Creatinine 0.44 - 1.00 mg/dL 0.62  0.59  0.55   Sodium 135 - 145 mmol/L 140  140  140   Potassium 3.5 - 5.1 mmol/L 4.9  5.4  4.1   Chloride 98 - 111 mmol/L 106  104  108   CO2 22 - 32 mmol/L '30  28  28   '$ Calcium 8.9 - 10.3 mg/dL 9.3  9.3  8.8   Total Protein 6.5 - 8.1 g/dL 6.2   6.2   Total Bilirubin 0.3 - 1.2 mg/dL 0.6   1.9   Alkaline Phos 38 - 126 U/L 127   99   AST 15 - 41 U/L 24   27   ALT 0 - 44 U/L 27   17        RADIOGRAPHIC STUDIES (from last 24 hours if applicable) I have personally reviewed the radiological images as listed and agreed with the findings in the report. No results found.      Visit Diagnosis: 1. Dizziness   2. Vitamin B12 deficiency  No orders of the defined types were placed in this encounter.   All questions were answered. The patient knows to call the clinic with any problems, questions or concerns. No barriers to learning was detected.  I have spent a total of 20 minutes minutes of face-to-face and non-face-to-face time, preparing to see the patient, obtaining and/or reviewing separately obtained history, performing a medically appropriate examination, counseling and educating the patient, ordering tests, documenting clinical information in the electronic health record.  Thank you for allowing me to participate in the care of this patient.    Barrie Folk, PA-C Department of Hematology/Oncology [facility] Phone: [phone]  [phone]    11/08/2022 4:33 PM

## 2022-11-13 ENCOUNTER — Encounter: Payer: Self-pay | Admitting: Hematology and Oncology

## 2022-11-16 ENCOUNTER — Other Ambulatory Visit (HOSPITAL_COMMUNITY): Payer: Self-pay

## 2022-11-18 ENCOUNTER — Other Ambulatory Visit (HOSPITAL_COMMUNITY): Payer: Self-pay

## 2022-11-19 ENCOUNTER — Telehealth: Payer: Self-pay

## 2022-11-19 ENCOUNTER — Other Ambulatory Visit: Payer: Self-pay

## 2022-11-19 DIAGNOSIS — R300 Dysuria: Secondary | ICD-10-CM

## 2022-11-19 NOTE — Telephone Encounter (Signed)
Patient called to relay symptoms of vaginal odor and burning with urination. Audibly crying on the phone. Requested to be seen by Dr. Sondra Come and receive IV antibiotics. Informed her I would relay concerns to Dr. Sondra Come and call her back with an update.   Updated Dr. Sondra Come who advised to have patient come in and provide a urine sample before making a determination about administering IV antibiotics.   Called all 3 of Sarahanne's numbers but unable to reach her. Left VM on her cell phone [phone]) requesting return call to assist with setting patient up for transportation to [facility] for lab appointment this week

## 2022-11-20 ENCOUNTER — Encounter: Payer: Self-pay | Admitting: Radiation Oncology

## 2022-11-20 ENCOUNTER — Other Ambulatory Visit: Payer: Self-pay

## 2022-11-20 ENCOUNTER — Ambulatory Visit
Admission: RE | Admit: 2022-11-20 | Discharge: 2022-11-20 | Disposition: A | Discharge status: Home / Self Care | Payer: Medicaid Other | Source: Ambulatory Visit | Attending: Radiation Oncology | Admitting: Radiation Oncology

## 2022-11-20 ENCOUNTER — Ambulatory Visit: Payer: Medicaid Other

## 2022-11-20 VITALS — BP 93/73 | HR 87 | Temp 97.9°F | Resp 20 | Ht 68.0 in | Wt 234.4 lb

## 2022-11-20 DIAGNOSIS — Z923 Personal history of irradiation: Secondary | ICD-10-CM | POA: Insufficient documentation

## 2022-11-20 DIAGNOSIS — C52 Malignant neoplasm of vagina: Secondary | ICD-10-CM

## 2022-11-20 DIAGNOSIS — R300 Dysuria: Secondary | ICD-10-CM | POA: Diagnosis not present

## 2022-11-20 DIAGNOSIS — Z79899 Other long term (current) drug therapy: Secondary | ICD-10-CM | POA: Diagnosis not present

## 2022-11-20 DIAGNOSIS — M62838 Other muscle spasm: Secondary | ICD-10-CM

## 2022-11-20 DIAGNOSIS — R102 Pelvic and perineal pain: Secondary | ICD-10-CM

## 2022-11-20 DIAGNOSIS — M6281 Muscle weakness (generalized): Secondary | ICD-10-CM

## 2022-11-20 DIAGNOSIS — N390 Urinary tract infection, site not specified: Secondary | ICD-10-CM

## 2022-11-20 DIAGNOSIS — Z8544 Personal history of malignant neoplasm of other female genital organs: Secondary | ICD-10-CM | POA: Insufficient documentation

## 2022-11-20 DIAGNOSIS — R293 Abnormal posture: Secondary | ICD-10-CM

## 2022-11-20 DIAGNOSIS — N3941 Urge incontinence: Secondary | ICD-10-CM

## 2022-11-20 DIAGNOSIS — R279 Unspecified lack of coordination: Secondary | ICD-10-CM

## 2022-11-20 DIAGNOSIS — Z87440 Personal history of urinary (tract) infections: Secondary | ICD-10-CM | POA: Diagnosis not present

## 2022-11-20 LAB — URINALYSIS, COMPLETE (UACMP) WITH MICROSCOPIC
Bilirubin Urine: NEGATIVE
Glucose, UA: NEGATIVE mg/dL
Ketones, ur: NEGATIVE mg/dL
Nitrite: POSITIVE — AB
Protein, ur: NEGATIVE mg/dL
Specific Gravity, Urine: 1.021 (ref 1.005–1.030)
WBC, UA: >50 WBC/hpf (ref 0–5)
pH: 5 (ref 5.0–8.0)

## 2022-11-20 NOTE — Progress Notes (Signed)
Radiation Oncology         [phone] ________________________________  Name: Jordan [name] MRN: [id number]  Date: 11/20/2022  DOB: 11-09-1965  Follow-Up Visit Note  CC: [name], NP  [name], NP    ICD-10-CM   1. Complicated UTI (urinary tract infection)  N39.0 CT Abdomen Pelvis W Contrast    2. 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 The patient called our office yesterday in audible distress due to burning with urination and vaginal odor. She requested to meet with me today for evaluation and IV antibiotics. I advised that she give  a urine sample before we make a decision regarding IV abx.    On nurse evaluation today, the patient endorsed pelvic pain rated at a 4/10, dark brown vaginal discharge with a foul odor, dysuria, urinary urgency, and urinary frequency.     In the interval since she was last seen for follow-up on 09/18/22, the patient followed up with Dr. Delsa Sale on 09/25/22. During which time, the patient denied any vaginal bleeding, pelvic pain, or leg pain. The patient however endorsed persistent symptoms (vulvar irritation) from a recent UTI, for which she completed a course of abx for the day prior.     Of note: the patient presented to the ED on 10/11/22 with the cc of dizziness (sent by her PCP). The patient also requested IV abx at this time for an untreated UTI. ED course included Antivert for vertigo.   The patient was referred to Dr. Amalia Hailey, [facility] urology, on 10/22/22 for further evaluation and management of radiation cystitis and recurrent  UTI's.  Dr. Amalia Hailey reviewed some outside laboratory studies that show the last time she had a positive culture was actually for Staph. She was on Keflex 250 daily at the time of this visit, and urinalysis collected the date of this visit looked normal. Cystoscopy performed during this visit (indicated by a past history of benign ulcerated areas within the bladder) showed no visible tumors or stones, and an overall normal appearing bladder.  The patient was also recently seen in the symptom management clinic on 11/08/22 with the cc of dizziness x several years. The patient reported that she did not take the Antivert that was prescribed to her in the ED. Per encounter notes, the patient has been seen for her chronic dizziness in the clinic in the past. The patient stated that she had a head CT performed recently per her PCP at [facility], however this is unavailable on EMR. She has been instructed to continue following with her PCP for this issue.   She was prescribed nitrofurantoin in light of her frequent urinary tract infections as prophylaxis however the patient could not tolerate this medication with what she describes as severe diarrhea.                   Allergies:  is allergic to ciprofloxacin, citalopram, lamotrigine, sulfa antibiotics, and tramadol.  Meds: Current Outpatient Medications  Medication Sig Dispense Refill   gabapentin (NEURONTIN) 800 MG tablet Take 800 mg by  mouth 3 (three) times daily.     oxybutynin (DITROPAN XL) 15 MG 24 hr tablet Take 15 mg by mouth 2 (two) times daily.     Oxycodone HCl 10 MG TABS Take 10 mg by mouth daily as needed.     cyanocobalamin (VITAMIN B12) 1000 MCG/ML injection Inject 1 mL (1,000 mcg total) into the muscle every 7 (seven) days. (Patient not taking: Reported on 11/20/2022) 1 mL 0   meclizine (ANTIVERT) 25 MG tablet Take 1 tablet (25 mg total) by mouth 3 (three) times daily as needed for dizziness. (Patient not taking: Reported on  11/20/2022) 30 tablet 0   nitrofurantoin, macrocrystal-monohydrate, (MACROBID) 100 MG capsule Take 1 capsule (100 mg total) by mouth at bedtime. Start taking after Keflex prescription is completed. (Patient not taking: Reported on 11/20/2022) 21 capsule 0   No current facility-administered medications for this encounter.   Facility-Administered Medications Ordered in Other Encounters  Medication Dose Route Frequency Provider Last Rate Last Admin   0.9 %  sodium chloride infusion   Intravenous Once Heath Lark, MD        Physical Findings: The patient is in no acute distress. Patient is alert and oriented.  height is '5\' 8"'$  (1.727 m) and weight is 234 lb 6.4 oz (106.3 kg). Her temperature is 97.9 F (36.6 C). Her blood pressure is 93/73 and her pulse is 87. Her respiration is 20 and oxygen saturation is 97%. .   Lungs are clear to auscultation bilaterally. Heart has regular rate and rhythm. No palpable cervical, supraclavicular, or axillary adenopathy. Abdomen soft, non-tender, normal bowel sounds.  On pelvic examination the external genitalia were unremarkable. A speculum exam was performed. There are no mucosal lesions noted in the vaginal vault.  Vaginal mucosa atrophic  On bimanual and rectovaginal examination there were no pelvic masses appreciated.  Vaginal vault foreshortened in light of previous vaginectomy and radiation treatments.  No obvious brownish discharge noted in the vaginal vault.  No obvious rectovaginal fistula found.  Lab Findings: Lab Results  Component Value Date   WBC 4.7 11/08/2022   HGB 14.4 11/08/2022   HCT 42.0 11/08/2022   MCV 91.3 11/08/2022   PLT 140 (L) 11/08/2022    Radiographic Findings: No results found.  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  The patient continues to have frequent urinary tract infections and complaints of dysuria vaginal pain.  She also complains  of brownish discharge which makes one  think about a possible rectovaginal or rectovesical fistula with frequent bladder infections.   Plan: Awaiting results of urine culture.  Preliminary urinalysis suspicious for another bladder infection.  She is also been set up for CT scan of the abdomen and pelvis with IV and oral contrast to further evaluate for possible fistula per recommendations from radiology.   25 minutes of total time was spent for this patient encounter, including preparation, face-to-face counseling with the patient and coordination of care, physical exam, and documentation of the encounter. ____________________________________  Blair Promise, PhD, MD  This document serves as a record of services personally performed by Gery Pray, MD. It was created on his behalf by Roney Mans, a trained medical scribe. The creation of this record is based on the scribe's personal observations and the provider's statements to them. This document has been checked and approved by the attending provider.
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
## 2022-11-20 NOTE — Progress Notes (Signed)
Jordan [name] is here today for follow up post radiation to the pelvic.  They completed their radiation on: 09/22/19   Does the patient complain of any of the following:  Pain: Patient reports pelvic pain. Rating 4/10.  Abdominal bloating: No Diarrhea/Constipation: No Nausea/Vomiting: No Vaginal Discharge: Yes, patient reports having dark brown discharge with foul odor.  Blood in Urine or Stool: No Urinary Issues (dysuria/incomplete emptying/ incontinence/ increased frequency/urgency): Yes, dysuria, urgency and frequency.  Does patient report using vaginal dilator 2-3 times a week and/or sexually active 2-3 weeks: No, patient continues to attend pelvic PT appointments.  Post radiation skin changes: No   Additional comments if applicable: Patient reports being unable to take prophylactic macrobid because it caused diarrhea.   BP 93/73 (BP Location: Left Arm, Patient Position: Sitting, Cuff Size: Large)   Pulse 87   Temp 97.9 F (36.6 C)   Resp 20   Ht '5\' 8"'$  (1.727 m)   Wt 234 lb 6.4 oz (106.3 kg)   LMP  (LMP Unknown)   SpO2 97%   BMI 35.64 kg/m

## 2022-11-21 ENCOUNTER — Encounter: Payer: Self-pay | Admitting: Hematology and Oncology

## 2022-11-21 ENCOUNTER — Ambulatory Visit: Payer: Medicaid Other | Admitting: Radiation Oncology

## 2022-11-21 ENCOUNTER — Other Ambulatory Visit: Payer: Self-pay

## 2022-11-21 ENCOUNTER — Emergency Department (HOSPITAL_COMMUNITY)
Admission: EM | Admit: 2022-11-21 | Discharge: 2022-11-21 | Disposition: A | Discharge status: Home / Self Care | Payer: Medicaid Other | Attending: Emergency Medicine | Admitting: Emergency Medicine

## 2022-11-21 ENCOUNTER — Telehealth: Payer: Self-pay | Admitting: *Deleted

## 2022-11-21 ENCOUNTER — Ambulatory Visit: Payer: Medicaid Other

## 2022-11-21 ENCOUNTER — Encounter (HOSPITAL_COMMUNITY): Payer: Self-pay

## 2022-11-21 DIAGNOSIS — R110 Nausea: Secondary | ICD-10-CM | POA: Insufficient documentation

## 2022-11-21 DIAGNOSIS — R000 Tachycardia, unspecified: Secondary | ICD-10-CM | POA: Insufficient documentation

## 2022-11-21 DIAGNOSIS — R42 Dizziness and giddiness: Secondary | ICD-10-CM | POA: Diagnosis present

## 2022-11-21 DIAGNOSIS — Z85828 Personal history of other malignant neoplasm of skin: Secondary | ICD-10-CM | POA: Diagnosis not present

## 2022-11-21 LAB — COMPREHENSIVE METABOLIC PANEL
ALT: 14 U/L (ref 0–44)
AST: 17 U/L (ref 15–41)
Albumin: 3.3 g/dL — ABNORMAL LOW (ref 3.5–5.0)
Alkaline Phosphatase: 93 U/L (ref 38–126)
Anion gap: 7 (ref 5–15)
BUN: 9 mg/dL (ref 6–20)
CO2: 26 mmol/L (ref 22–32)
Calcium: 8 mg/dL — ABNORMAL LOW (ref 8.9–10.3)
Chloride: 109 mmol/L (ref 98–111)
Creatinine, Ser: 0.55 mg/dL (ref 0.44–1.00)
GFR, Estimated: >60 mL/min (ref >60)
Glucose, Bld: 82 mg/dL (ref 70–99)
Potassium: 3.5 mmol/L (ref 3.5–5.1)
Sodium: 142 mmol/L (ref 135–145)
Total Bilirubin: 0.8 mg/dL (ref 0.3–1.2)
Total Protein: 6.1 g/dL — ABNORMAL LOW (ref 6.5–8.1)

## 2022-11-21 LAB — CBC WITH DIFFERENTIAL/PLATELET
Abs Immature Granulocytes: 0.01 10*3/uL (ref 0.00–0.07)
Basophils Absolute: 0 10*3/uL (ref 0.0–0.1)
Basophils Relative: 0 %
Eosinophils Absolute: 0 10*3/uL (ref 0.0–0.5)
Eosinophils Relative: 0 %
HCT: 46.5 % — ABNORMAL HIGH (ref 36.0–46.0)
Hemoglobin: 15.6 g/dL — ABNORMAL HIGH (ref 12.0–15.0)
Immature Granulocytes: 0 %
Lymphocytes Relative: 28 %
Lymphs Abs: 1.1 10*3/uL (ref 0.7–4.0)
MCH: 30.6 pg (ref 26.0–34.0)
MCHC: 33.5 g/dL (ref 30.0–36.0)
MCV: 91.2 fL (ref 80.0–100.0)
Monocytes Absolute: 0.3 10*3/uL (ref 0.1–1.0)
Monocytes Relative: 8 %
Neutro Abs: 2.6 10*3/uL (ref 1.7–7.7)
Neutrophils Relative %: 64 %
Platelets: 143 10*3/uL — ABNORMAL LOW (ref 150–400)
RBC: 5.1 MIL/uL (ref 3.87–5.11)
RDW: 11.9 % (ref 11.5–15.5)
WBC: 4.1 10*3/uL (ref 4.0–10.5)
nRBC: 0 % (ref 0.0–0.2)

## 2022-11-21 LAB — URINALYSIS, ROUTINE W REFLEX MICROSCOPIC
Bilirubin Urine: NEGATIVE
Glucose, UA: NEGATIVE mg/dL
Ketones, ur: NEGATIVE mg/dL
Nitrite: POSITIVE — AB
Protein, ur: NEGATIVE mg/dL
Specific Gravity, Urine: 1.016 (ref 1.005–1.030)
pH: 6 (ref 5.0–8.0)

## 2022-11-21 MED ORDER — SODIUM CHLORIDE 0.9 % IV BOLUS
1000.0000 mL | Freq: Once | INTRAVENOUS | Status: AC
  Administered 2022-11-21: 1000 mL via INTRAVENOUS

## 2022-11-21 MED ORDER — LORAZEPAM 2 MG/ML IJ SOLN
1.0000 mg | Freq: Once | INTRAMUSCULAR | Status: AC
  Administered 2022-11-21: 1 mg via INTRAVENOUS
  Filled 2022-11-21: qty 1

## 2022-11-21 MED ORDER — MECLIZINE HCL 25 MG PO TABS
25.0000 mg | ORAL_TABLET | Freq: Once | ORAL | Status: AC
  Administered 2022-11-21: 25 mg via ORAL
  Filled 2022-11-21: qty 1

## 2022-11-21 MED ORDER — ONDANSETRON HCL 4 MG/2ML IJ SOLN
4.0000 mg | Freq: Once | INTRAMUSCULAR | Status: AC
  Administered 2022-11-21: 4 mg via INTRAVENOUS
  Filled 2022-11-21: qty 2

## 2022-11-21 MED ORDER — MECLIZINE HCL 25 MG PO TABS: 25.0000 mg | ORAL_TABLET | Freq: Three times a day (TID) | ORAL | 0 refills | Status: DC | PRN

## 2022-11-21 MED ORDER — DIAZEPAM 5 MG PO TABS: 5.0000 mg | ORAL_TABLET | Freq: Two times a day (BID) | ORAL | 0 refills | Status: DC | PRN

## 2022-11-21 MED ORDER — ONDANSETRON 4 MG PO TBDP: 4.0000 mg | ORAL_TABLET | Freq: Three times a day (TID) | ORAL | 0 refills | Status: DC | PRN

## 2022-11-21 NOTE — Progress Notes (Signed)
CSW has approved transportation as provider has reported that the patient can not take a bus.

## 2022-11-21 NOTE — ED Notes (Signed)
Purewick placed on pt due to dizziness. Laying on stretcher NAD, call bell within reach

## 2022-11-21 NOTE — Telephone Encounter (Signed)
RETURNED PATIENT'S PHONE CALL, LVM FOR A RETURN CALL 

## 2022-11-21 NOTE — ED Notes (Signed)
Repeat CMP sent to lab 

## 2022-11-21 NOTE — ED Provider Notes (Signed)
[place] EMERGENCY DEPARTMENT AT [facility] Provider Note   CSN: [id number] Arrival date & time: 11/21/22  1045     History  Chief Complaint  Patient presents with   Dizziness    Dizzy/lighthead started this morning when she woke up. Intermit since then .    Jordan [name] is a 57 y.o. female.  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  Pt has frequent episodes of dizziness.  She woke up this am feeling dizzy.  The pt feels nauseous.  Pt did not take anything for sx.  EMS brough her here, but did not give her anything.         Home Medications Prior to Admission medications   Medication Sig Start Date End Date Taking? Authorizing Provider  diazepam (VALIUM) 5 MG tablet Take 1 tablet (5 mg total) by mouth every 12 (twelve) hours as needed (dizziness). 11/21/22  Yes Isla Pence, MD  meclizine (ANTIVERT) 25 MG tablet Take 1 tablet (25 mg total) by mouth 3 (three) times daily as needed for dizziness. 11/21/22  Yes Isla Pence, MD  ondansetron (ZOFRAN-ODT) 4 MG disintegrating tablet Take 1 tablet (4 mg total) by mouth every 8 (eight) hours as needed. 11/21/22  Yes Isla Pence, MD  cyanocobalamin (VITAMIN B12) 1000 MCG/ML injection Inject 1 mL (1,000 mcg total) into the muscle every 7 (seven) days. Patient not taking: Reported on 11/20/2022 08/22/22   Barb Merino, MD  gabapentin (NEURONTIN) 800 MG tablet Take 800 mg by mouth 3 (three) times daily. 01/11/22   [provider]  meclizine (ANTIVERT) 25 MG tablet Take 1 tablet (25 mg total) by mouth 3 (three) times daily as needed for dizziness. Patient not taking: Reported on 11/20/2022 10/11/22   Wilnette Kales, PA  nitrofurantoin, macrocrystal-monohydrate, (MACROBID) 100 MG capsule Take 1 capsule (100 mg total) by mouth at bedtime. Start taking after Keflex prescription is completed. Patient not taking: Reported on  11/20/2022 11/06/22   Chancy Milroy, MD  oxybutynin (DITROPAN XL) 15 MG 24 hr tablet Take 15 mg by mouth 2 (two) times daily. 10/17/22   [provider]  Oxycodone HCl 10 MG TABS Take 10 mg by mouth daily as needed. 01/27/20   [provider]      Allergies    Ciprofloxacin, Citalopram, Lamotrigine, Sulfa antibiotics, and Tramadol    Review of Systems   Review of Systems  Gastrointestinal:  Positive for nausea.  Neurological:  Positive for dizziness.  All other systems reviewed and are negative.   Physical Exam Updated Vital Signs BP 115/84   Pulse (!) 106   Temp 98.6 F (37 C) (Oral)   Resp 20   Ht '5\' 8"'$  (1.727 m)   Wt 106.3 kg   LMP  (LMP Unknown)   SpO2 100%   BMI 35.63 kg/m  Physical Exam Vitals and nursing note reviewed.  Constitutional:      Appearance: Normal appearance.  HENT:     Head: Normocephalic and atraumatic.     Right Ear: External ear normal.     Left Ear: External ear normal.     Nose: Nose normal.     Mouth/Throat:     Mouth: Mucous membranes are dry.  Eyes:     Extraocular Movements: Extraocular movements intact.     Conjunctiva/sclera: Conjunctivae normal.     Pupils: Pupils are equal, round, and reactive to  light.  Cardiovascular:     Rate and Rhythm: Regular rhythm. Tachycardia present.     Pulses: Normal pulses.     Heart sounds: Normal heart sounds.  Pulmonary:     Effort: Pulmonary effort is normal.     Breath sounds: Normal breath sounds.  Abdominal:     General: Abdomen is flat. Bowel sounds are normal.     Palpations: Abdomen is soft.  Musculoskeletal:        General: Normal range of motion.     Cervical back: Normal range of motion and neck supple.  Skin:    General: Skin is warm.     Capillary Refill: Capillary refill takes less than 2 seconds.  Neurological:     General: No focal deficit present.     Mental Status: She is alert and oriented to person, place, and time.  Psychiatric:        Mood and Affect:  Mood normal.        Behavior: Behavior normal.     ED Results / Procedures / Treatments   Labs (all labs ordered are listed, but only abnormal results are displayed) Labs Reviewed  CBC WITH DIFFERENTIAL/PLATELET - Abnormal; Notable for the following components:      Result Value   Hemoglobin 15.6 (*)    HCT 46.5 (*)    Platelets 143 (*)    All other components within normal limits  URINALYSIS, ROUTINE W REFLEX MICROSCOPIC - Abnormal; Notable for the following components:   APPearance HAZY (*)    Hgb urine dipstick SMALL (*)    Nitrite POSITIVE (*)    Leukocytes,Ua TRACE (*)    Bacteria, UA FEW (*)    All other components within normal limits  COMPREHENSIVE METABOLIC PANEL - Abnormal; Notable for the following components:   Calcium 8.0 (*)    Total Protein 6.1 (*)    Albumin 3.3 (*)    All other components within normal limits    EKG EKG Interpretation  Date/Time:  Thursday November 21 2022 11:25:03 EST Ventricular Rate:  115 PR Interval:  202 QRS Duration: 76 QT Interval:  310 QTC Calculation: 429 R Axis:   30 Text Interpretation: Sinus tachycardia Borderline prolonged PR interval Abnormal R-wave progression, early transition Since last tracing rate faster Confirmed by Isla Pence [phone]) on 11/21/2022 11:49:10 AM  Radiology No results found.  Procedures Procedures    Medications Ordered in ED Medications  sodium chloride 0.9 % bolus 1,000 mL (1,000 mLs Intravenous New Bag/Given 11/21/22 1151)  ondansetron (ZOFRAN) injection 4 mg (4 mg Intravenous Given 11/21/22 1150)  LORazepam (ATIVAN) injection 1 mg (1 mg Intravenous Given 11/21/22 1151)  meclizine (ANTIVERT) tablet 25 mg (25 mg Oral Given 11/21/22 1151)    ED Course/ Medical Decision Making/ A&P                             Medical Decision Making Amount and/or Complexity of Data Reviewed Labs: ordered.  Risk Prescription drug management.   This patient presents to the ED for concern of dizziness, this  involves an extensive number of treatment options, and is a complaint that carries with it a high risk of complications and morbidity.  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  The pertinent results include:  cbc nl other than mild thrombocytopenia (plt 143 which is chronic); ua with + nitrite, tr le and only 11-20 wbcs   Imaging Studies ordered:   I agree with the radiologist interpretation   Cardiac Monitoring:  The patient was maintained on a cardiac monitor.  I personally viewed and interpreted the cardiac monitored which showed an underlying rhythm of: st   Medicines ordered and prescription drug management:  I ordered medication including IVFs, ativan, and antivert  for dehydration, anxiety, and dizziness  Reevaluation of the patient after these medicines showed that the patient improved I have reviewed the patients home medicines and have made adjustments as needed   Test Considered:  Mri, but pt has had dizziness chronically.  Pt was able to ambulate.  I don't think it's a cva if she's had this several times in the past.  Pt can f/u as an outpatient.  She is to return if worse.  F/u with pcp.   Critical Interventions:  meds   Problem List / ED Course:  Dizziness:  pt is feeling much better.  She is stable for d/c.  Return if worse.   Reevaluation:  After the interventions noted above, I reevaluated the patient and found that they have :improved   Social Determinants of Health:  Lives at home   Dispostion:  After consideration of the diagnostic  results and the patients response to treatment, I feel that the patent would benefit from discharge with outpatient f/u.          Final Clinical Impression(s) / ED Diagnoses Final diagnoses:  Dizziness    Rx / DC Orders ED Discharge Orders          Ordered    diazepam (VALIUM) 5 MG tablet  Every 12 hours PRN        11/21/22 1536    meclizine (ANTIVERT) 25 MG tablet  3 times daily PRN        11/21/22 1536    ondansetron (ZOFRAN-ODT) 4 MG disintegrating tablet  Every 8 hours PRN        11/21/22 1536              Isla Pence, MD 11/21/22 1539

## 2022-11-21 NOTE — ED Notes (Signed)
Pt had returned from using the bathroom using a walker. Pt was bedside and was trying to sit on the bed. Pt sat down on the bed but kept getting up stating "this isn't right." After a few attempts pt asked to sit in the chair. Chair bought closer to patient. Pt began to shake and stated her legs were getting weak. This RN grabbed by the wrist to assist to the chair. Pt let go of walker and fell to the floor.

## 2022-11-21 NOTE — ED Triage Notes (Signed)
Patient reports dizziness that started 1 hour ago when sitting and when standing.  Patient has hx of same 4 years ago.  She denies chest pain.  EKG 120 ST.  Patient is anxious.  She has hx of psych history.  CBG 114.  Bp 144/98, 99% RA.  Patient is alert and oriented.  Skin warm and dry.  She is coming from home.

## 2022-11-21 NOTE — ED Notes (Signed)
Patient continues to eat.  Encouraged her to eat her snack so that we can ambulate her.

## 2022-11-21 NOTE — ED Notes (Signed)
cmp

## 2022-11-21 NOTE — ED Notes (Signed)
Patient is eating a sandwich.  She is alert and remains on cardiac monitoring.  She is aware of plan to ambulate after she eats.

## 2022-11-21 NOTE — Discharge Instructions (Addendum)
Use the medicines prescribed to help with the dizziness.  Rest at home over the next 2 to 3 days.  Follow-up with your family doctor next week

## 2022-11-22 ENCOUNTER — Other Ambulatory Visit: Payer: Self-pay | Admitting: *Deleted

## 2022-11-22 ENCOUNTER — Other Ambulatory Visit: Payer: Self-pay

## 2022-11-22 ENCOUNTER — Other Ambulatory Visit: Payer: Self-pay | Admitting: Radiology

## 2022-11-22 ENCOUNTER — Telehealth: Payer: Self-pay

## 2022-11-22 DIAGNOSIS — N390 Urinary tract infection, site not specified: Secondary | ICD-10-CM

## 2022-11-22 DIAGNOSIS — C52 Malignant neoplasm of vagina: Secondary | ICD-10-CM

## 2022-11-22 LAB — URINE CULTURE: Culture: >=100000 — AB

## 2022-11-22 MED ORDER — DEXTROSE 5 % IV SOLN: 1.0000 g | INTRAVENOUS | Status: DC

## 2022-11-22 MED ORDER — DEXTROSE 5 % IV SOLN
1.0000 g | Freq: Once | INTRAVENOUS | Status: AC
  Administered 2022-11-23: 1 g via INTRAVENOUS
  Filled 2022-11-22: qty 10

## 2022-11-22 MED ORDER — DEXTROSE 5 % IV SOLN
1.0000 g | INTRAVENOUS | Status: DC
  Filled 2022-11-22: qty 10

## 2022-11-22 MED ORDER — DOXYCYCLINE HYCLATE 100 MG PO TABS: 100.0000 mg | ORAL_TABLET | Freq: Two times a day (BID) | ORAL | 0 refills | Status: DC

## 2022-11-22 NOTE — Telephone Encounter (Signed)
Call placed to patient to make aware of new appointment to receive IV antibiotics on 11/23/22. Transportation  to pick patient up at 830am. Patient voiced understanding.

## 2022-11-22 NOTE — Telephone Encounter (Signed)
Called placed to make aware the antibiotics for UTI  was sent to pharmacy listed in chart. Patient states," I would rather have IV antibiotics."  " I can wait until Monday if I have to."   Will discuss with PA and return patients call.

## 2022-11-22 NOTE — Telephone Encounter (Signed)
Returned Pts call regarding dizziness. Pt was seen in ED yesterday for same complaint, where IVF, Meclizine, Ativan, and ondansetron given with improvement. Pt encouraged to increase hydration and take Meclizine/Valium/Ondansetron as prescribed. Pt verbalized understanding with no further questions at this time.

## 2022-11-22 NOTE — Progress Notes (Signed)
Patient's urine culture came back positive on 11/22/22. Patient has allergies to Ciprofloxacin and Bactrim and was not able to tolerate Nitrofurantoin well due to severe diarrhea. Of note, patient has a history of frequent UTIs. I prescribed her 10 days of doxycycline for lower UTI. Marylene Land LPN is calling patient to inform her that the culture came back positive and that I have sent in a prescription for her to pick up.     Leona Singleton, [place]

## 2022-11-23 ENCOUNTER — Inpatient Hospital Stay: Payer: Medicaid Other | Attending: Radiation Oncology

## 2022-11-23 ENCOUNTER — Inpatient Hospital Stay: Payer: Medicaid Other

## 2022-11-23 VITALS — BP 119/67 | HR 71 | Temp 97.9°F | Resp 18

## 2022-11-23 DIAGNOSIS — N390 Urinary tract infection, site not specified: Secondary | ICD-10-CM | POA: Diagnosis present

## 2022-11-23 MED ORDER — DEXTROSE 5 % IV SOLN
1.0000 g | INTRAVENOUS | Status: DC
  Filled 2022-11-23: qty 10

## 2022-11-23 MED ORDER — SODIUM CHLORIDE 0.9 % IV SOLN: Freq: Once | INTRAVENOUS | Status: AC

## 2022-11-23 NOTE — Patient Instructions (Signed)
Ceftriaxone Injection What is this medication? CEFTRIAXONE (sef try AX one) treats infections caused by bacteria. It belongs to a group of medications called cephalosporin antibiotics. It will not treat colds, the flu, or infections caused by viruses. This medicine may be used for other purposes; ask your health care provider or pharmacist if you have questions. COMMON BRAND NAME(S): Ceftrisol Plus, Rocephin What should I tell my care team before I take this medication? They need to know if you have any of these conditions: Bleeding disorder High bilirubin level in newborn patients Kidney disease Liver disease Poor nutrition An unusual or allergic reaction to ceftriaxone, other penicillin or cephalosporin antibiotics, other medications, foods, dyes, or preservatives Pregnant or trying to get pregnant Breast-feeding How should I use this medication? This medication is injected into a vein or a muscle. It is usually given by your care team in a hospital or clinic setting. It may also be given at home. If you get this medication at home, you will be taught how to prepare and give it. Use exactly as directed. Take it as directed on the prescription label at the same time every day. Keep taking it even if you think you are better. It is important that you put your used needles and syringes in a special sharps container. Do not put them in a trash can. If you do not have a sharps container, call your pharmacist or care team to get one. Talk to your care team about the use of this medication in children. While it may be prescribed for children as young as newborns for selected conditions, precautions do apply. Overdosage: If you think you have taken too much of this medicine contact a poison control center or emergency room at once. NOTE: This medicine is only for you. Do not share this medicine with others. What if I miss a dose? If you get this medication at the hospital or clinic: It is important  not to miss your dose. Call your care team if you are unable to keep an appointment. If you give yourself this medication at home: If you miss a dose, take it as soon as you can. Then continue your normal schedule. If it is almost time for your next dose, take only that dose. Do not take double or extra doses. Call your care team with questions. What may interact with this medication? Estrogen or progestin hormones Intravenous calcium This list may not describe all possible interactions. Give your health care provider a list of all the medicines, herbs, non-prescription drugs, or dietary supplements you use. Also tell them if you smoke, drink alcohol, or use illegal drugs. Some items may interact with your medicine. What should I watch for while using this medication? Tell your care team if your symptoms do not start to get better or if they get worse. Do not treat diarrhea with over the counter products. Contact your care team if you have diarrhea that lasts more than 2 days or if it is severe and watery. If you have diabetes, you may get a false-positive result for sugar in your urine. Check with your care team. If you are being treated for a sexually transmitted infection (STI), avoid sexual contact until you have finished your treatment. Your partner may also need treatment. What side effects may I notice from receiving this medication? Side effects that you should report to your care team as soon as possible: Allergic reactions--skin rash, itching, hives, swelling of the face, lips, tongue, or throat   Hemolytic anemia--unusual weakness or fatigue, dizziness, headache, trouble breathing, dark urine, yellowing skin or eyes Severe diarrhea, fever Unusual vaginal discharge, itching, or odor Side effects that usually do not require medical attention (report to your care team if they continue or are bothersome): Diarrhea Headache Nausea Pain, redness, or irritation at injection site This list may  not describe all possible side effects. Call your doctor for medical advice about side effects. You may report side effects to FDA at 1-800-FDA-1088. Where should I keep my medication? Keep out of the reach of children and pets. You will be instructed on how to store this medication. Get rid of any unused medication after the expiration date. To get rid of medications that are no longer needed or have expired: Take the medication to a medication take-back program. Check with your pharmacy or law enforcement to find a location. If you cannot return the medication, ask your pharmacist or care team how to get rid of this medication safely. NOTE: This sheet is a summary. It may not cover all possible information. If you have questions about this medicine, talk to your doctor, pharmacist, or health care provider.  2023 Elsevier/Gold Standard (2007-11-28 00:00:00)

## 2022-11-25 ENCOUNTER — Inpatient Hospital Stay: Payer: Medicaid Other

## 2022-11-25 ENCOUNTER — Telehealth: Payer: Self-pay

## 2022-11-25 ENCOUNTER — Other Ambulatory Visit: Payer: Self-pay | Admitting: Hematology and Oncology

## 2022-11-25 ENCOUNTER — Other Ambulatory Visit: Payer: Self-pay

## 2022-11-25 DIAGNOSIS — N390 Urinary tract infection, site not specified: Secondary | ICD-10-CM | POA: Diagnosis not present

## 2022-11-25 MED ORDER — DEXTROSE 5 % IV SOLN: 1.0000 g | INTRAVENOUS | Status: DC

## 2022-11-25 MED ORDER — DEXTROSE 5 % IV SOLN
1.0000 g | Freq: Once | INTRAVENOUS | Status: AC
  Administered 2022-11-25: 1 g via INTRAVENOUS
  Filled 2022-11-25: qty 10

## 2022-11-25 NOTE — Patient Instructions (Signed)
Ceftriaxone Injection What is this medication? CEFTRIAXONE (sef try AX one) treats infections caused by bacteria. It belongs to a group of medications called cephalosporin antibiotics. It will not treat colds, the flu, or infections caused by viruses. This medicine may be used for other purposes; ask your health care provider or pharmacist if you have questions. COMMON BRAND NAME(S): Ceftrisol Plus, Rocephin What should I tell my care team before I take this medication? They need to know if you have any of these conditions: Bleeding disorder High bilirubin level in newborn patients Kidney disease Liver disease Poor nutrition An unusual or allergic reaction to ceftriaxone, other penicillin or cephalosporin antibiotics, other medications, foods, dyes, or preservatives Pregnant or trying to get pregnant Breast-feeding How should I use this medication? This medication is injected into a vein or a muscle. It is usually given by your care team in a hospital or clinic setting. It may also be given at home. If you get this medication at home, you will be taught how to prepare and give it. Use exactly as directed. Take it as directed on the prescription label at the same time every day. Keep taking it even if you think you are better. It is important that you put your used needles and syringes in a special sharps container. Do not put them in a trash can. If you do not have a sharps container, call your pharmacist or care team to get one. Talk to your care team about the use of this medication in children. While it may be prescribed for children as young as newborns for selected conditions, precautions do apply. Overdosage: If you think you have taken too much of this medicine contact a poison control center or emergency room at once. NOTE: This medicine is only for you. Do not share this medicine with others. What if I miss a dose? If you get this medication at the hospital or clinic: It is important  not to miss your dose. Call your care team if you are unable to keep an appointment. If you give yourself this medication at home: If you miss a dose, take it as soon as you can. Then continue your normal schedule. If it is almost time for your next dose, take only that dose. Do not take double or extra doses. Call your care team with questions. What may interact with this medication? Estrogen or progestin hormones Intravenous calcium This list may not describe all possible interactions. Give your health care provider a list of all the medicines, herbs, non-prescription drugs, or dietary supplements you use. Also tell them if you smoke, drink alcohol, or use illegal drugs. Some items may interact with your medicine. What should I watch for while using this medication? Tell your care team if your symptoms do not start to get better or if they get worse. Do not treat diarrhea with over the counter products. Contact your care team if you have diarrhea that lasts more than 2 days or if it is severe and watery. If you have diabetes, you may get a false-positive result for sugar in your urine. Check with your care team. If you are being treated for a sexually transmitted infection (STI), avoid sexual contact until you have finished your treatment. Your partner may also need treatment. What side effects may I notice from receiving this medication? Side effects that you should report to your care team as soon as possible: Allergic reactions--skin rash, itching, hives, swelling of the face, lips, tongue, or throat   Hemolytic anemia--unusual weakness or fatigue, dizziness, headache, trouble breathing, dark urine, yellowing skin or eyes Severe diarrhea, fever Unusual vaginal discharge, itching, or odor Side effects that usually do not require medical attention (report to your care team if they continue or are bothersome): Diarrhea Headache Nausea Pain, redness, or irritation at injection site This list may  not describe all possible side effects. Call your doctor for medical advice about side effects. You may report side effects to FDA at 1-800-FDA-1088. Where should I keep my medication? Keep out of the reach of children and pets. You will be instructed on how to store this medication. Get rid of any unused medication after the expiration date. To get rid of medications that are no longer needed or have expired: Take the medication to a medication take-back program. Check with your pharmacy or law enforcement to find a location. If you cannot return the medication, ask your pharmacist or care team how to get rid of this medication safely. NOTE: This sheet is a summary. It may not cover all possible information. If you have questions about this medicine, talk to your doctor, pharmacist, or health care provider.  2023 Elsevier/Gold Standard (2007-11-28 00:00:00)

## 2022-11-25 NOTE — Telephone Encounter (Signed)
Patient called in reporting that she felt nauseated and wanted to know if she should still come in for her IV antibiotics. Encouraged patient to attend appointment. Patient voiced understanding.

## 2022-11-26 ENCOUNTER — Inpatient Hospital Stay: Payer: Medicaid Other

## 2022-11-26 ENCOUNTER — Ambulatory Visit (HOSPITAL_COMMUNITY): Payer: Medicaid Other

## 2022-11-26 ENCOUNTER — Telehealth: Payer: Self-pay | Admitting: *Deleted

## 2022-11-26 ENCOUNTER — Inpatient Hospital Stay: Payer: Medicaid Other | Admitting: Hematology and Oncology

## 2022-11-26 ENCOUNTER — Telehealth: Payer: Self-pay

## 2022-11-26 NOTE — Telephone Encounter (Signed)
Patient called in to cancel infusion appointment and CT scan for today due to severe diarrhea. Encouraged patient to take Imodium. Patient states, " I don't have any." Patient states she will resume antibiotics tomorrow. Patient made aware that follow up appointment with Dr. Sondra Come  on 11/27/22 will also be reschedule. Patient voiced understanding. Patient will be notified once CT scan has been rescheduled.

## 2022-11-26 NOTE — Telephone Encounter (Signed)
CALLED PATIENT TO INFORM OF CT FOR 12-10-22- ARRIVAL TIME- 1:15 PM @ [facility], PATIENT TO BE NPO- 4 HRS. PRIOR TO TEST, PATIENT TO RECEIVE RESULTS FROM DR. KINARD ON 12-12-22 @ 11:15 AM, SPOKE WITH PATIENT AND SHE IS AWARE OF THESE APPTS. AND THE INSTRUCTIONS

## 2022-11-27 ENCOUNTER — Ambulatory Visit: Payer: Medicaid Other | Admitting: Radiation Oncology

## 2022-11-27 ENCOUNTER — Inpatient Hospital Stay: Payer: Medicaid Other

## 2022-11-27 ENCOUNTER — Ambulatory Visit: Payer: Medicaid Other

## 2022-11-27 ENCOUNTER — Other Ambulatory Visit: Payer: Self-pay

## 2022-11-27 DIAGNOSIS — N390 Urinary tract infection, site not specified: Secondary | ICD-10-CM

## 2022-11-27 MED ORDER — DEXTROSE 5 % IV SOLN
1.0000 g | Freq: Once | INTRAVENOUS | Status: AC
  Administered 2022-11-27: 1 g via INTRAVENOUS
  Filled 2022-11-27: qty 10

## 2022-11-27 NOTE — Patient Instructions (Signed)
Ceftriaxone Injection What is this medication? CEFTRIAXONE (sef try AX one) treats infections caused by bacteria. It belongs to a group of medications called cephalosporin antibiotics. It will not treat colds, the flu, or infections caused by viruses. This medicine may be used for other purposes; ask your health care provider or pharmacist if you have questions. COMMON BRAND NAME(S): Ceftrisol Plus, Rocephin What should I tell my care team before I take this medication? They need to know if you have any of these conditions: Bleeding disorder High bilirubin level in newborn patients Kidney disease Liver disease Poor nutrition An unusual or allergic reaction to ceftriaxone, other penicillin or cephalosporin antibiotics, other medications, foods, dyes, or preservatives Pregnant or trying to get pregnant Breast-feeding How should I use this medication? This medication is injected into a vein or a muscle. It is usually given by your care team in a hospital or clinic setting. It may also be given at home. If you get this medication at home, you will be taught how to prepare and give it. Use exactly as directed. Take it as directed on the prescription label at the same time every day. Keep taking it even if you think you are better. It is important that you put your used needles and syringes in a special sharps container. Do not put them in a trash can. If you do not have a sharps container, call your pharmacist or care team to get one. Talk to your care team about the use of this medication in children. While it may be prescribed for children as young as newborns for selected conditions, precautions do apply. Overdosage: If you think you have taken too much of this medicine contact a poison control center or emergency room at once. NOTE: This medicine is only for you. Do not share this medicine with others. What if I miss a dose? If you get this medication at the hospital or clinic: It is important  not to miss your dose. Call your care team if you are unable to keep an appointment. If you give yourself this medication at home: If you miss a dose, take it as soon as you can. Then continue your normal schedule. If it is almost time for your next dose, take only that dose. Do not take double or extra doses. Call your care team with questions. What may interact with this medication? Estrogen or progestin hormones Intravenous calcium This list may not describe all possible interactions. Give your health care provider a list of all the medicines, herbs, non-prescription drugs, or dietary supplements you use. Also tell them if you smoke, drink alcohol, or use illegal drugs. Some items may interact with your medicine. What should I watch for while using this medication? Tell your care team if your symptoms do not start to get better or if they get worse. Do not treat diarrhea with over the counter products. Contact your care team if you have diarrhea that lasts more than 2 days or if it is severe and watery. If you have diabetes, you may get a false-positive result for sugar in your urine. Check with your care team. If you are being treated for a sexually transmitted infection (STI), avoid sexual contact until you have finished your treatment. Your partner may also need treatment. What side effects may I notice from receiving this medication? Side effects that you should report to your care team as soon as possible: Allergic reactions--skin rash, itching, hives, swelling of the face, lips, tongue, or throat   Hemolytic anemia--unusual weakness or fatigue, dizziness, headache, trouble breathing, dark urine, yellowing skin or eyes Severe diarrhea, fever Unusual vaginal discharge, itching, or odor Side effects that usually do not require medical attention (report to your care team if they continue or are bothersome): Diarrhea Headache Nausea Pain, redness, or irritation at injection site This list may  not describe all possible side effects. Call your doctor for medical advice about side effects. You may report side effects to FDA at 1-800-FDA-1088. Where should I keep my medication? Keep out of the reach of children and pets. You will be instructed on how to store this medication. Get rid of any unused medication after the expiration date. To get rid of medications that are no longer needed or have expired: Take the medication to a medication take-back program. Check with your pharmacy or law enforcement to find a location. If you cannot return the medication, ask your pharmacist or care team how to get rid of this medication safely. NOTE: This sheet is a summary. It may not cover all possible information. If you have questions about this medicine, talk to your doctor, pharmacist, or health care provider.  2023 Elsevier/Gold Standard (2007-11-28 00:00:00)  

## 2022-11-28 ENCOUNTER — Inpatient Hospital Stay: Payer: Medicaid Other

## 2022-11-28 ENCOUNTER — Encounter: Payer: Self-pay | Admitting: General Practice

## 2022-11-28 DIAGNOSIS — N390 Urinary tract infection, site not specified: Secondary | ICD-10-CM

## 2022-11-28 MED ORDER — SODIUM CHLORIDE 0.9 % IV SOLN: Freq: Once | INTRAVENOUS | Status: AC

## 2022-11-28 MED ORDER — DEXTROSE 5 % IV SOLN
1.0000 g | Freq: Once | INTRAVENOUS | Status: AC
  Administered 2022-11-28: 1 g via INTRAVENOUS
  Filled 2022-11-28: qty 10

## 2022-11-28 NOTE — Progress Notes (Signed)
[facility]ual [facility] Note  Followed up with [name] in infusion per her request. She is still working through her grief after the death of her [name], especially through the holidays and approaching Valentine's Day, which would have been their 18th anniversary.   Provided reflective listening, normalization of feelings, and grief education. [name] calls regularly for follow-up support.   [place], [place], [facility] Pager [phone] Voicemail [phone]

## 2022-11-28 NOTE — Patient Instructions (Signed)
Ceftriaxone Injection What is this medication? CEFTRIAXONE (sef try AX one) treats infections caused by bacteria. It belongs to a group of medications called cephalosporin antibiotics. It will not treat colds, the flu, or infections caused by viruses. This medicine may be used for other purposes; ask your health care provider or pharmacist if you have questions. COMMON BRAND NAME(S): Ceftrisol Plus, Rocephin What should I tell my care team before I take this medication? They need to know if you have any of these conditions: Bleeding disorder High bilirubin level in newborn patients Kidney disease Liver disease Poor nutrition An unusual or allergic reaction to ceftriaxone, other penicillin or cephalosporin antibiotics, other medications, foods, dyes, or preservatives Pregnant or trying to get pregnant Breast-feeding How should I use this medication? This medication is injected into a vein or a muscle. It is usually given by your care team in a hospital or clinic setting. It may also be given at home. If you get this medication at home, you will be taught how to prepare and give it. Use exactly as directed. Take it as directed on the prescription label at the same time every day. Keep taking it even if you think you are better. It is important that you put your used needles and syringes in a special sharps container. Do not put them in a trash can. If you do not have a sharps container, call your pharmacist or care team to get one. Talk to your care team about the use of this medication in children. While it may be prescribed for children as young as newborns for selected conditions, precautions do apply. Overdosage: If you think you have taken too much of this medicine contact a poison control center or emergency room at once. NOTE: This medicine is only for you. Do not share this medicine with others. What if I miss a dose? If you get this medication at the hospital or clinic: It is important  not to miss your dose. Call your care team if you are unable to keep an appointment. If you give yourself this medication at home: If you miss a dose, take it as soon as you can. Then continue your normal schedule. If it is almost time for your next dose, take only that dose. Do not take double or extra doses. Call your care team with questions. What may interact with this medication? Estrogen or progestin hormones Intravenous calcium This list may not describe all possible interactions. Give your health care provider a list of all the medicines, herbs, non-prescription drugs, or dietary supplements you use. Also tell them if you smoke, drink alcohol, or use illegal drugs. Some items may interact with your medicine. What should I watch for while using this medication? Tell your care team if your symptoms do not start to get better or if they get worse. Do not treat diarrhea with over the counter products. Contact your care team if you have diarrhea that lasts more than 2 days or if it is severe and watery. If you have diabetes, you may get a false-positive result for sugar in your urine. Check with your care team. If you are being treated for a sexually transmitted infection (STI), avoid sexual contact until you have finished your treatment. Your partner may also need treatment. What side effects may I notice from receiving this medication? Side effects that you should report to your care team as soon as possible: Allergic reactions--skin rash, itching, hives, swelling of the face, lips, tongue, or throat   Hemolytic anemia--unusual weakness or fatigue, dizziness, headache, trouble breathing, dark urine, yellowing skin or eyes Severe diarrhea, fever Unusual vaginal discharge, itching, or odor Side effects that usually do not require medical attention (report to your care team if they continue or are bothersome): Diarrhea Headache Nausea Pain, redness, or irritation at injection site This list may  not describe all possible side effects. Call your doctor for medical advice about side effects. You may report side effects to FDA at 1-800-FDA-1088. Where should I keep my medication? Keep out of the reach of children and pets. You will be instructed on how to store this medication. Get rid of any unused medication after the expiration date. To get rid of medications that are no longer needed or have expired: Take the medication to a medication take-back program. Check with your pharmacy or law enforcement to find a location. If you cannot return the medication, ask your pharmacist or care team how to get rid of this medication safely. NOTE: This sheet is a summary. It may not cover all possible information. If you have questions about this medicine, talk to your doctor, pharmacist, or health care provider.  2023 Elsevier/Gold Standard (2007-11-28 00:00:00)

## 2022-11-29 ENCOUNTER — Other Ambulatory Visit: Payer: Self-pay

## 2022-11-29 ENCOUNTER — Inpatient Hospital Stay: Payer: Medicaid Other

## 2022-11-29 DIAGNOSIS — N390 Urinary tract infection, site not specified: Secondary | ICD-10-CM

## 2022-11-29 MED ORDER — DEXTROSE 5 % IV SOLN
1.0000 g | Freq: Once | INTRAVENOUS | Status: AC
  Administered 2022-11-29: 1 g via INTRAVENOUS
  Filled 2022-11-29: qty 10

## 2022-11-29 MED ORDER — SODIUM CHLORIDE 0.9 % IV SOLN: INTRAVENOUS | Status: AC

## 2022-11-29 NOTE — Patient Instructions (Signed)
Ceftriaxone Injection What is this medication? CEFTRIAXONE (sef try AX one) treats infections caused by bacteria. It belongs to a group of medications called cephalosporin antibiotics. It will not treat colds, the flu, or infections caused by viruses. This medicine may be used for other purposes; ask your health care provider or pharmacist if you have questions. COMMON BRAND NAME(S): Ceftrisol Plus, Rocephin What should I tell my care team before I take this medication? They need to know if you have any of these conditions: Bleeding disorder High bilirubin level in newborn patients Kidney disease Liver disease Poor nutrition An unusual or allergic reaction to ceftriaxone, other penicillin or cephalosporin antibiotics, other medications, foods, dyes, or preservatives Pregnant or trying to get pregnant Breast-feeding How should I use this medication? This medication is injected into a vein or a muscle. It is usually given by your care team in a hospital or clinic setting. It may also be given at home. If you get this medication at home, you will be taught how to prepare and give it. Use exactly as directed. Take it as directed on the prescription label at the same time every day. Keep taking it even if you think you are better. It is important that you put your used needles and syringes in a special sharps container. Do not put them in a trash can. If you do not have a sharps container, call your pharmacist or care team to get one. Talk to your care team about the use of this medication in children. While it may be prescribed for children as young as newborns for selected conditions, precautions do apply. Overdosage: If you think you have taken too much of this medicine contact a poison control center or emergency room at once. NOTE: This medicine is only for you. Do not share this medicine with others. What if I miss a dose? If you get this medication at the hospital or clinic: It is important  not to miss your dose. Call your care team if you are unable to keep an appointment. If you give yourself this medication at home: If you miss a dose, take it as soon as you can. Then continue your normal schedule. If it is almost time for your next dose, take only that dose. Do not take double or extra doses. Call your care team with questions. What may interact with this medication? Estrogen or progestin hormones Intravenous calcium This list may not describe all possible interactions. Give your health care provider a list of all the medicines, herbs, non-prescription drugs, or dietary supplements you use. Also tell them if you smoke, drink alcohol, or use illegal drugs. Some items may interact with your medicine. What should I watch for while using this medication? Tell your care team if your symptoms do not start to get better or if they get worse. Do not treat diarrhea with over the counter products. Contact your care team if you have diarrhea that lasts more than 2 days or if it is severe and watery. If you have diabetes, you may get a false-positive result for sugar in your urine. Check with your care team. If you are being treated for a sexually transmitted infection (STI), avoid sexual contact until you have finished your treatment. Your partner may also need treatment. What side effects may I notice from receiving this medication? Side effects that you should report to your care team as soon as possible: Allergic reactions--skin rash, itching, hives, swelling of the face, lips, tongue, or throat   Hemolytic anemia--unusual weakness or fatigue, dizziness, headache, trouble breathing, dark urine, yellowing skin or eyes Severe diarrhea, fever Unusual vaginal discharge, itching, or odor Side effects that usually do not require medical attention (report to your care team if they continue or are bothersome): Diarrhea Headache Nausea Pain, redness, or irritation at injection site This list may  not describe all possible side effects. Call your doctor for medical advice about side effects. You may report side effects to FDA at 1-800-FDA-1088. Where should I keep my medication? Keep out of the reach of children and pets. You will be instructed on how to store this medication. Get rid of any unused medication after the expiration date. To get rid of medications that are no longer needed or have expired: Take the medication to a medication take-back program. Check with your pharmacy or law enforcement to find a location. If you cannot return the medication, ask your pharmacist or care team how to get rid of this medication safely. NOTE: This sheet is a summary. It may not cover all possible information. If you have questions about this medicine, talk to your doctor, pharmacist, or health care provider.  2023 Elsevier/Gold Standard (2007-11-28 00:00:00)

## 2022-12-02 ENCOUNTER — Ambulatory Visit: Payer: Medicaid Other | Admitting: Obstetrics and Gynecology

## 2022-12-03 ENCOUNTER — Telehealth: Payer: Self-pay

## 2022-12-03 NOTE — Telephone Encounter (Signed)
Returned her call. Reviewed her up coming appts with Dr. Alvy Bimler. She verbalized understanding.

## 2022-12-04 ENCOUNTER — Ambulatory Visit: Payer: Medicaid Other

## 2022-12-04 ENCOUNTER — Ambulatory Visit: Payer: Medicaid Other | Admitting: Clinical

## 2022-12-04 DIAGNOSIS — F4321 Adjustment disorder with depressed mood: Secondary | ICD-10-CM

## 2022-12-04 NOTE — BH Specialist Note (Signed)
Integrated Behavioral Health Follow Up In-Person Visit  MRN: [id number] Name: Jordan [name]  Number of [place] Clinician visits: 5-Fifth Visit  Session Start time: 0200   Session End time: [id number]  Total time in minutes: 65   Types of Service: Individual psychotherapy  I reviewed patient visit with the [facility] Intern, and I concur with the treatment plan, as documented in the [employer] Intern note.   No charge for this visit due to [facility] Intern seeing patient.  Vesta Mixer, MSW, [place] for [facility] at [facility]   Interpretor:No.   Subjective: Jordan [name] is a 57 y.o. female  Patient was referred by Dr. Arlina Robes  for Grief. Patient reports the following symptoms/concerns: Grief surrounding the loss of her long-term partner and their upcoming anniversary date.  Duration of problem: ongoing ; Severity of problem: moderate  Objective: Mood: Hopeless and Irritable and Affect: Appropriate and Tearful Risk of harm to self or others: No plan to harm self or others  Life Context: Family and Social: Pt currently experiencing increased feelings of grief due to anniversary of her and her long-term partner  School/Work: Pt wants to go into assisted living but cannot afford it currently  Self-Care: Pt has been caring for her cat and talking to her daughter more  Life Changes: Pt is experiencing increasingly bad symptoms from UTI and has concerns about her health  Patient and/or Family's Strengths/Protective Factors: Social connections and Sense of purpose  Goals Addressed: Patient will:  Reduce symptoms of: anxiety, depression, and stress   Increase knowledge and/or ability of: coping skills, self-management skills, and stress reduction   Demonstrate ability to: Increase adequate support systems for patient/family and Begin healthy grieving over loss  Progress towards  Goals: Ongoing  Interventions: Interventions utilized:  Motivational Interviewing, Solution-Focused Strategies, and Supportive Reflection Standardized Assessments completed: Not Needed  Patient and/or Family Response: Pt agrees with treatment plan    Plan: Follow up with behavioral health clinician on : 2/28 at 10:00 AM  Behavioral recommendations: -Continue prioritizing healthy self-care (regular meals, adequate rest; allowing practical help from supportive friends and family Grounding Techniques  [phone] Grounding Technique: This grounding technique can be utilized when you're feeling stressed or anxious. It can also be useful when you are having trouble falling asleep when your mind is racing.  List 5 things you can see. Describe any details that stand out.    Ex: I can see my dog sleeping on the rug beside the couch. List 4 things you can touch. Describe how they feel.     Ex: The shirt I'm wearing is very soft. It feels silky to the touch. List 3 things you can hear. Think about how loud or quiet they may be.    Ex: I can hear a car going down the street. It sounds distant. List 2 things you can smell. Does the smell make you think of anything?    Ex: I can smell the lotion on my hands. It reminds me of when I bought it with my sister. List 1 thing you can taste. You can always place a piece of candy in your mouth or take a bite of something. What kind of flavors do you taste?     Ex: I can taste the toothpaste I used to brush my teeth.   Referral(s): [place] (In Clinic)  [place]

## 2022-12-05 ENCOUNTER — Telehealth: Payer: Self-pay

## 2022-12-05 NOTE — Telephone Encounter (Signed)
Returned her call. She is complaining nausea today and feeling lightheaded. She is able to drink fluids. She is wearing a heart monitor at home after 2/1 ER visit. Instructed to take the zofran to see if that helps with nausea. She has not picked it up from the pharmacy and will work on trying to find someone to pick it up. She has not taken the meclizine because she does not want to take pills. Encourage her to take the meclizine to see if that helps with the dizziness. Instructed to call PCP back or to urgent care/ ER to be evaluated, she has already called PCP today. At the end of the call, she said she may try calling PCP back.

## 2022-12-06 ENCOUNTER — Telehealth: Payer: Self-pay

## 2022-12-06 NOTE — Telephone Encounter (Signed)
Returned her call. She is at the doctors office and does not have any questions. She was just calling.

## 2022-12-09 ENCOUNTER — Encounter (HOSPITAL_BASED_OUTPATIENT_CLINIC_OR_DEPARTMENT_OTHER): Payer: Self-pay

## 2022-12-09 DIAGNOSIS — R0683 Snoring: Secondary | ICD-10-CM

## 2022-12-10 ENCOUNTER — Ambulatory Visit (HOSPITAL_COMMUNITY)
Admission: RE | Admit: 2022-12-10 | Discharge: 2022-12-10 | Disposition: A | Discharge status: Home / Self Care | Payer: Medicaid Other | Source: Ambulatory Visit | Attending: Radiation Oncology | Admitting: Radiation Oncology

## 2022-12-10 ENCOUNTER — Encounter (HOSPITAL_COMMUNITY): Payer: Self-pay

## 2022-12-10 DIAGNOSIS — C52 Malignant neoplasm of vagina: Secondary | ICD-10-CM | POA: Diagnosis not present

## 2022-12-10 DIAGNOSIS — N390 Urinary tract infection, site not specified: Secondary | ICD-10-CM | POA: Diagnosis present

## 2022-12-10 MED ORDER — IOHEXOL 300 MG/ML  SOLN
100.0000 mL | Freq: Once | INTRAMUSCULAR | Status: AC | PRN
  Administered 2022-12-10: 100 mL via INTRAVENOUS

## 2022-12-10 MED ORDER — IOHEXOL 9 MG/ML PO SOLN
500.0000 mL | ORAL | Status: AC
  Administered 2022-12-10: 1000 mL via ORAL

## 2022-12-10 MED ORDER — IOHEXOL 9 MG/ML PO SOLN
ORAL | Status: AC
  Filled 2022-12-10: qty 1000

## 2022-12-10 MED ORDER — SODIUM CHLORIDE (PF) 0.9 % IJ SOLN
INTRAMUSCULAR | Status: AC
  Filled 2022-12-10: qty 50

## 2022-12-11 NOTE — Progress Notes (Signed)
Radiation Oncology         [phone] ________________________________  Name: Jordan [name] MRN: [id number]  Date: 12/12/2022  DOB: [birth year]  Follow-Up Visit Note  CC: [name], NP  [name], NP  No diagnosis found.  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 She was last seen here for follow-up on 11/20/22. During her last visit, the patient complained of UTI symptoms. A urine culture was obtained which came back positive on 11/22/22 and she was prescribed a 10 day course of doxycycline. However, the patient contacted our office requesting IV abx instead (ceftriaxone). She received her final dose on 11/29/22.    Her most recent CT AP on 12/10/22 demonstrates no acute findings or evidence of metastatic disease in the abdomen or pelvis. CT also shows stability of the 0.6 cm solid RLL pulmonary nodule, and chronic mild diffuse bronchiectasis at the lung bases.     Of note: the patient presented to the [facility] ED on 11/21/22 with c/o nausea and dizziness which began after she woke up in the morning. ED course included IVFs, ativan, and antivert for dehydration, anxiety, and dizziness with improvement.   ***                          Allergies:  is allergic to ciprofloxacin, citalopram, lamotrigine, sulfa antibiotics, and tramadol.  Meds: Current Outpatient Medications  Medication Sig Dispense Refill   cyanocobalamin (VITAMIN B12) 1000 MCG/ML injection Inject 1 mL (1,000 mcg total) into the muscle every 7 (seven) days. (Patient not taking: Reported on 11/20/2022) 1 mL 0   diazepam (VALIUM) 5 MG tablet Take 1  tablet (5 mg total) by mouth every 12 (twelve) hours as needed (dizziness). 10 tablet 0   gabapentin (NEURONTIN) 800 MG tablet Take 800 mg by mouth 3 (three) times daily.     meclizine (ANTIVERT) 25 MG tablet Take 1 tablet (25 mg total) by mouth 3 (three) times daily as needed for dizziness. (Patient not taking: Reported on 11/20/2022) 30 tablet 0   meclizine (ANTIVERT) 25 MG tablet Take 1 tablet (25 mg total) by mouth 3 (three) times daily as needed for dizziness. 30 tablet 0   nitrofurantoin, macrocrystal-monohydrate, (MACROBID) 100 MG capsule Take 1 capsule (100 mg total) by mouth at bedtime. Start taking after Keflex prescription is completed. (Patient not taking: Reported on 11/20/2022) 21 capsule 0   ondansetron (ZOFRAN-ODT) 4 MG disintegrating tablet Take 1 tablet (4 mg total) by mouth every 8 (eight) hours as needed. 20 tablet 0   oxybutynin (DITROPAN XL) 15 MG 24 hr tablet Take 15 mg by mouth 2 (two) times daily.     Oxycodone HCl 10 MG TABS Take 10 mg by mouth daily as needed.     No current facility-administered medications for this encounter.   Facility-Administered Medications Ordered in Other Encounters  Medication Dose Route Frequency Provider Last Rate Last Admin   0.9 %  sodium chloride infusion   Intravenous Once Heath Lark, MD        Physical Findings: The patient is in no acute distress. Patient is  alert and oriented.  vitals were not taken for this visit. .  No significant changes. Lungs are clear to auscultation bilaterally. Heart has regular rate and rhythm. No palpable cervical, supraclavicular, or axillary adenopathy. Abdomen soft, non-tender, normal bowel sounds.  On pelvic examination the external genitalia were unremarkable. A speculum exam was performed. There are no mucosal lesions noted in the vaginal vault. A Pap smear was obtained of the proximal vagina. On bimanual and rectovaginal examination there were no pelvic masses appreciated. ***   Lab Findings: Lab  Results  Component Value Date   WBC 4.1 11/21/2022   HGB 15.6 (H) 11/21/2022   HCT 46.5 (H) 11/21/2022   MCV 91.2 11/21/2022   PLT 143 (L) 11/21/2022    Radiographic Findings: CT Abdomen Pelvis W Contrast  Result Date: 12/11/2022 CLINICAL DATA:  Vaginal cancer. Radiation cystitis. Complicated UTI. Urethral fistula. Pelvic pain, urgency and frequency. Concern for bladder fistula. * Tracking Code: BO * EXAM: CT ABDOMEN AND PELVIS WITH CONTRAST TECHNIQUE: Multidetector CT imaging of the abdomen and pelvis was performed using the standard protocol following bolus administration of intravenous contrast. RADIATION DOSE REDUCTION: This exam was performed according to the departmental dose-optimization program which includes automated exposure control, adjustment of the mA and/or kV according to patient size and/or use of iterative reconstruction technique. CONTRAST:  139m OMNIPAQUE IOHEXOL 300 MG/ML  SOLN COMPARISON:  08/09/2022 CT abdomen/pelvis. FINDINGS: Lower chest: Stable 0.6 cm solid right lower lobe pulmonary nodule (series 6/image 16). Chronic mild diffuse bronchiectasis at the right greater than left lung bases. No acute abnormality at the lung bases. Hepatobiliary: Normal liver size. Stable dystrophic subcentimeter coarse liver dome calcification. No liver masses. Cholecystectomy. No biliary ductal dilatation. Pancreas: Normal, with no mass or duct dilation. Spleen: Normal size. No mass. Adrenals/Urinary Tract: Normal adrenals. Simple 3.4 cm medial upper right renal cyst, for which no follow-up imaging is recommended. No suspicious renal masses. No hydronephrosis. Symmetric normal contrast nephrograms. Normal bladder with no definite bladder wall thickening. No bladder gas. No bladder stones. No evidence of a fistula involving the bladder. The urethra is not well evaluated by CT, with no gross urethral abnormality noted. Stomach/Bowel: Normal non-distended stomach. Normal caliber small bowel with no  small bowel wall thickening. Normal appendix. Oral contrast transits to the cecum. Normal large bowel with no diverticulosis, large bowel wall thickening or pericolonic fat stranding. Vascular/Lymphatic: Atherosclerotic nonaneurysmal abdominal aorta. Patent portal, splenic and renal veins. No pathologically enlarged lymph nodes in the abdomen or pelvis. Reproductive: Status post hysterectomy, with no abnormal findings at the vaginal cuff. No adnexal mass. Other: No pneumoperitoneum, ascites or focal fluid collection. Musculoskeletal: No aggressive appearing focal osseous lesions. Mild chronic L2 vertebral compression fracture. Mild lumbar degenerative disc disease, most prominent at L5-S1. IMPRESSION: 1. No acute abnormality. No CT findings of a bladder fistula. 2. No evidence of metastatic disease in the abdomen or pelvis. 3. Stable 0.6 cm solid right lower lobe pulmonary nodule. 4. Chronic mild diffuse bronchiectasis at the lung bases. 5.  Aortic Atherosclerosis (ICD10-I70.0). Electronically Signed   By: JIlona SorrelM.D.   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  ***  Plan:  ***   *** minutes of total time was spent for this patient encounter, including preparation, face-to-face counseling with the patient and coordination of care, physical exam, and documentation of the encounter.  ____________________________________  Blair Promise, PhD, MD  This document serves as a record of services personally performed by Gery Pray, MD. It was created on his behalf by Roney Mans, a trained medical scribe. The creation of this record is based on the scribe's personal observations and the provider's statements to them. This document has been checked and approved by the attending provider.

## 2022-12-12 ENCOUNTER — Ambulatory Visit
Admission: RE | Admit: 2022-12-12 | Discharge: 2022-12-12 | Disposition: A | Discharge status: Home / Self Care | Payer: Medicaid Other | Source: Ambulatory Visit | Attending: Radiation Oncology | Admitting: Radiation Oncology

## 2022-12-12 ENCOUNTER — Ambulatory Visit: Payer: Medicaid Other

## 2022-12-12 ENCOUNTER — Encounter: Payer: Self-pay | Admitting: Radiation Oncology

## 2022-12-12 ENCOUNTER — Ambulatory Visit
Admission: RE | Admit: 2022-12-12 | Discharge: 2022-12-12 | Disposition: A | Discharge status: Home / Self Care | Payer: Medicaid Other | Source: Ambulatory Visit | Attending: Nurse Practitioner | Admitting: Nurse Practitioner

## 2022-12-12 VITALS — BP 117/74 | HR 76 | Temp 97.9°F | Resp 20 | Ht 68.0 in | Wt 238.4 lb

## 2022-12-12 DIAGNOSIS — M4856XA Collapsed vertebra, not elsewhere classified, lumbar region, initial encounter for fracture: Secondary | ICD-10-CM | POA: Diagnosis not present

## 2022-12-12 DIAGNOSIS — M5137 Other intervertebral disc degeneration, lumbosacral region: Secondary | ICD-10-CM | POA: Diagnosis not present

## 2022-12-12 DIAGNOSIS — Z79899 Other long term (current) drug therapy: Secondary | ICD-10-CM | POA: Insufficient documentation

## 2022-12-12 DIAGNOSIS — Z8544 Personal history of malignant neoplasm of other female genital organs: Secondary | ICD-10-CM | POA: Insufficient documentation

## 2022-12-12 DIAGNOSIS — J479 Bronchiectasis, uncomplicated: Secondary | ICD-10-CM | POA: Insufficient documentation

## 2022-12-12 DIAGNOSIS — R300 Dysuria: Secondary | ICD-10-CM | POA: Diagnosis not present

## 2022-12-12 DIAGNOSIS — C52 Malignant neoplasm of vagina: Secondary | ICD-10-CM

## 2022-12-12 DIAGNOSIS — M5136 Other intervertebral disc degeneration, lumbar region: Secondary | ICD-10-CM | POA: Diagnosis not present

## 2022-12-12 LAB — URINALYSIS, COMPLETE (UACMP) WITH MICROSCOPIC
Bilirubin Urine: NEGATIVE
Glucose, UA: NEGATIVE mg/dL
Ketones, ur: NEGATIVE mg/dL
Nitrite: NEGATIVE
Protein, ur: NEGATIVE mg/dL
Specific Gravity, Urine: 1.024 (ref 1.005–1.030)
WBC, UA: >50 WBC/hpf (ref 0–5)
pH: 5 (ref 5.0–8.0)

## 2022-12-12 NOTE — Progress Notes (Signed)
[name] is here today for follow up post radiation to the pelvic.  They completed their radiation on: 09/22/19   Does the patient complain of any of the following:  Pain:Yes- to pelvis Abdominal bloating: No Diarrhea/Constipation: No Nausea/Vomiting: No Vaginal Discharge:No, however reports strong vaginal odor.  Blood in Urine or Stool: No Urinary Issues (dysuria/incomplete emptying/ incontinence/ increased frequency/urgency): Yes, dysuria, urgency and frequency.  Does patient report using vaginal dilator 2-3 times a week and/or sexually active 2-3 weeks: No Post radiation skin changes: No   Additional comments if applicable:    BP [id number] (BP Location: Left Arm, Patient Position: Sitting, Cuff Size: Large)   Pulse 76   Temp 97.9 F (36.6 C)   Resp 20   Ht 5' 8"$  (1.727 m)   Wt 238 lb 6.4 oz (108.1 kg)   LMP  (LMP Unknown)   SpO2 100%   BMI 36.25 kg/m

## 2022-12-13 ENCOUNTER — Telehealth: Payer: Self-pay

## 2022-12-13 NOTE — Telephone Encounter (Signed)
Returned her call requesting call. Left a message asking her to call the office back if needed.

## 2022-12-15 LAB — URINE CULTURE: Culture: >=100000 — AB

## 2022-12-17 ENCOUNTER — Other Ambulatory Visit: Payer: Self-pay

## 2022-12-17 ENCOUNTER — Telehealth: Payer: Self-pay | Admitting: Hematology and Oncology

## 2022-12-17 ENCOUNTER — Inpatient Hospital Stay: Payer: Medicaid Other

## 2022-12-17 ENCOUNTER — Ambulatory Visit (HOSPITAL_BASED_OUTPATIENT_CLINIC_OR_DEPARTMENT_OTHER): Payer: Medicaid Other | Admitting: Physician Assistant

## 2022-12-17 VITALS — BP 116/83 | HR 87 | Temp 98.0°F | Resp 18

## 2022-12-17 DIAGNOSIS — N390 Urinary tract infection, site not specified: Secondary | ICD-10-CM

## 2022-12-17 DIAGNOSIS — R21 Rash and other nonspecific skin eruption: Secondary | ICD-10-CM

## 2022-12-17 DIAGNOSIS — E538 Deficiency of other specified B group vitamins: Secondary | ICD-10-CM

## 2022-12-17 DIAGNOSIS — R300 Dysuria: Secondary | ICD-10-CM

## 2022-12-17 MED ORDER — DEXTROSE 5 % IV SOLN
1.0000 g | Freq: Once | INTRAVENOUS | Status: DC
  Filled 2022-12-17: qty 10

## 2022-12-17 MED ORDER — DIPHENHYDRAMINE HCL 25 MG PO CAPS
25.0000 mg | ORAL_CAPSULE | Freq: Once | ORAL | Status: AC
  Administered 2022-12-17: 25 mg via ORAL
  Filled 2022-12-17: qty 1

## 2022-12-17 MED ORDER — DEXTROSE 5 % IV SOLN
1.0000 g | Freq: Once | INTRAVENOUS | Status: AC
  Administered 2022-12-17: 1 g via INTRAVENOUS
  Filled 2022-12-17: qty 10

## 2022-12-17 MED ORDER — SODIUM CHLORIDE 0.9 % IV SOLN: Freq: Once | INTRAVENOUS | Status: DC

## 2022-12-17 MED ORDER — DEXTROSE 5 % IV SOLN: 1.0000 g | INTRAVENOUS | Status: DC

## 2022-12-17 MED ORDER — DEXTROSE 5 % IV SOLN: 1.0000 g | Freq: Once | INTRAVENOUS | Status: DC

## 2022-12-17 NOTE — Patient Instructions (Signed)
Ceftriaxone Injection What is this medication? CEFTRIAXONE (sef try AX one) treats infections caused by bacteria. It belongs to a group of medications called cephalosporin antibiotics. It will not treat colds, the flu, or infections caused by viruses. This medicine may be used for other purposes; ask your health care provider or pharmacist if you have questions. COMMON BRAND NAME(S): Ceftrisol Plus, Rocephin What should I tell my care team before I take this medication? They need to know if you have any of these conditions: Bleeding disorder High bilirubin level in newborn patients Kidney disease Liver disease Poor nutrition An unusual or allergic reaction to ceftriaxone, other penicillin or cephalosporin antibiotics, other medications, foods, dyes, or preservatives Pregnant or trying to get pregnant Breast-feeding How should I use this medication? This medication is injected into a vein or a muscle. It is usually given by your care team in a hospital or clinic setting. It may also be given at home. If you get this medication at home, you will be taught how to prepare and give it. Use exactly as directed. Take it as directed on the prescription label at the same time every day. Keep taking it even if you think you are better. It is important that you put your used needles and syringes in a special sharps container. Do not put them in a trash can. If you do not have a sharps container, call your pharmacist or care team to get one. Talk to your care team about the use of this medication in children. While it may be prescribed for children as young as newborns for selected conditions, precautions do apply. Overdosage: If you think you have taken too much of this medicine contact a poison control center or emergency room at once. NOTE: This medicine is only for you. Do not share this medicine with others. What if I miss a dose? If you get this medication at the hospital or clinic: It is important  not to miss your dose. Call your care team if you are unable to keep an appointment. If you give yourself this medication at home: If you miss a dose, take it as soon as you can. Then continue your normal schedule. If it is almost time for your next dose, take only that dose. Do not take double or extra doses. Call your care team with questions. What may interact with this medication? Estrogen or progestin hormones Intravenous calcium This list may not describe all possible interactions. Give your health care provider a list of all the medicines, herbs, non-prescription drugs, or dietary supplements you use. Also tell them if you smoke, drink alcohol, or use illegal drugs. Some items may interact with your medicine. What should I watch for while using this medication? Tell your care team if your symptoms do not start to get better or if they get worse. Do not treat diarrhea with over the counter products. Contact your care team if you have diarrhea that lasts more than 2 days or if it is severe and watery. If you have diabetes, you may get a false-positive result for sugar in your urine. Check with your care team. If you are being treated for a sexually transmitted infection (STI), avoid sexual contact until you have finished your treatment. Your partner may also need treatment. What side effects may I notice from receiving this medication? Side effects that you should report to your care team as soon as possible: Allergic reactions--skin rash, itching, hives, swelling of the face, lips, tongue, or throat   Hemolytic anemia--unusual weakness or fatigue, dizziness, headache, trouble breathing, dark urine, yellowing skin or eyes Severe diarrhea, fever Unusual vaginal discharge, itching, or odor Side effects that usually do not require medical attention (report to your care team if they continue or are bothersome): Diarrhea Headache Nausea Pain, redness, or irritation at injection site This list may  not describe all possible side effects. Call your doctor for medical advice about side effects. You may report side effects to FDA at 1-800-FDA-1088. Where should I keep my medication? Keep out of the reach of children and pets. You will be instructed on how to store this medication. Get rid of any unused medication after the expiration date. To get rid of medications that are no longer needed or have expired: Take the medication to a medication take-back program. Check with your pharmacy or law enforcement to find a location. If you cannot return the medication, ask your pharmacist or care team how to get rid of this medication safely. NOTE: This sheet is a summary. It may not cover all possible information. If you have questions about this medicine, talk to your doctor, pharmacist, or health care provider.  2023 Elsevier/Gold Standard (2021-12-17 00:00:00)  

## 2022-12-17 NOTE — Addendum Note (Signed)
Addended by: Tora Kindred on: 12/17/2022 11:33 AM   Modules accepted: Orders

## 2022-12-17 NOTE — Progress Notes (Signed)
Symptom Management Consult Note [place]    Patient Care Team: [name], NP as PCP - General (Nurse Practitioner)    Name / MRN / DOB: Jordan [name]  [id number]  1966/06/08   Date of visit: 12/17/2022   Chief Complaint/Reason for visit: rash   Current Therapy: B12 injections    ASSESSMENT & PLAN: Patient is a 57 y.o. 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 Alvy Bimler.  I have viewed most recent oncology note and lab work.    #B12 deficiency and anemia -Currently stable. Last CBC from 11/21/22 without anemia - Next appointment with oncologist is [id number]   #Complicated UTI -Patient currently being managed by radiation team for this. - Scheduled for IV antibiotic rocephin x 5 days starting today. Recently completed course as well - Patient noticed rash on face and left forearm during last several minutes of infusion. Patient unsure if she had rash prior to appointment today. She is also reporting tingling sensation in her tongue. - Given 25 mg PO Benadryl and tingling resolved. She had normal VS, clear lung sounds, and no evidence of angioedema. - She has received rocephin multiple times in the past without symptoms of allergic reaction. This would be unusual for her to suddenly develop allergy.  - Patient stable for discharge home. I will recheck her before and after infusion tomorrow. I informed Dr. Sondra Come of ?allergic reaction, he agrees with plan of care. - Strict ED precautions discussed should symptoms worsen. Patient agreeable with plan for discharge.       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  This revealed that she was status post hysterectomy with no  evidence of metastatic disease in the abdomen or pelvis or chest.   06/26/2019 Imaging   MRI of the pelvis with IV contrast: susceptibility artifact in the vaginal cuff from suture material limiting evaluation.  However no focal soft tissue masses were seen involving the vaginal cuff and no abnormal soft tissue densities in the parametrial regions.  The right and left ovaries were grossly normal.   07/22/2019 Surgery   Robotic assisted radical upper vaginectomy with bilateral pelvic sentinel lymph node biopsy, right salpingo-oophorectomy, left salpingectomy.  Intraoperative findings were significant for no grossly visible vaginal lesions.  The ovaries and fallopian tubes were adherent to the rectum and vaginal cuff in the obliterated cul-de-sac.  The ovaries themselves are grossly normal-appearing but densely adherent with no suspicious lymph nodes.  There is clinical stage I disease, microscopic.   07/22/2019 Cancer Staging   Postoperatively final pathology returned as FIGO stage Ia microscopic poorly differentiated squamous cell carcinoma.  The tumor site was the posterior vaginal wall and measured 0.6 cm.  The posterior margin (the posterior vaginal cuff margin) was broadly positive for tumor cells.  The deep margin was negative.  There was 2.5 mm depth of invasion.  All sentinel lymph nodes were negative for metastases, as was the left fallopian tube and right tube and ovary.   08/24/2019 - 09/22/2019 Radiation Therapy   Vaginal brachytherapy   09/15/2020 PET scan   Soft tissue attenuating structure is noted within the left pelvis measuring 1.7 cm. This was favored to be a left  ovary. No clear evidence for recurrence.    01/01/2021 Imaging   Pelvic MRI 1. No evidence of local tumor recurrence at the vaginal resection margin. 2. No evidence of metastatic disease in the pelvis. 3. Left adnexal 2.3 x 1.6 x 1.6 cm structure is compatible with an atrophic left ovary, unchanged from 08/31/2020 outside  CT. 4. Chronic mild diffuse bladder wall thickening, unchanged, potentially the sequela of prior radiation therapy       Interval history-: Jordan [name] is a 57 y.o. female with oncologic history as above seen in the infusion center today with chief complaint of sudden onset of rash. Patient is unaccompanied.  Infusion RN called to report patient noticed a rash on her left forearm and face. Patient thinks the rash started during the antibiotic infusion however is not entirely confident of this. Maybe she had the rash earlier today however she cannot recall a specific trigger. She denies associated pruritus. She reports having Rocephin multiple times in the past without adverse reactions. She later noticed a tingling sensation in her tongue. She does admit to eat hot soup earlier today while here in the infusion center, maybe she burned her tongue. Denies difficulty breathing, shortness of breath, wheezing, nausea.       ROS  All other systems are reviewed and are negative for acute change except as noted in the HPI.    Allergies  Allergen Reactions   Ciprofloxacin Swelling    Lips swell, tongue swells, face swells   Citalopram Other (See Comments)    Possible cause of pancytopenia. Swelling of tongue, face and throat   Lamotrigine Other (See Comments)    Possible cause of pancytopenia. Swelling of face, throat and tongue   Sulfa Antibiotics Anaphylaxis   Tramadol Anaphylaxis, Shortness Of Breath and Swelling     Past Medical History:  Diagnosis Date   Alcohol abuse    Anemia    patient denies   Bipolar 1 disorder ([place])    No medications currently   CAP (community acquired pneumonia) 03/17/2015   History of radiation therapy 08/24/19-09/22/19   Vaginal brachytherapy   Dr. 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   Mental disorder    Obesity    PICC line infection 05/17/2015   Sepsis due to Gram negative bacteria (MDR E Coli) 02/18/2015    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HCC)    UTI (lower urinary tract infection)    Vaginal Pap smear, abnormal      Past Surgical History:  Procedure Laterality Date   ABDOMINAL HYSTERECTOMY     CERVICAL CONIZATION W/BX N/A 07/01/2016   Procedure: CONIZATION CERVIX WITH BIOPSY;  Surgeon: Chancy Milroy, MD;  Location: [facility];  Service: Gynecology;  Laterality: N/A;   CHOLECYSTECTOMY     EXTERNAL FIXATION LEG Right 04/09/2020   Procedure: EXTERNAL FIXATION LEG;  Surgeon: Hiram Gash, MD;  Location: [facility];  Service: Orthopedics;  Laterality: Right;   HYSTEROSCOPY WITH D & C N/A 01/25/2016   Procedure: DILATATION AND CURETTAGE /HYSTEROSCOPY;  Surgeon: Mora Bellman, MD;  Location: [facility];  Service: Gynecology;  Laterality: N/A;   LYMPH NODE BIOPSY Bilateral 07/22/2019   Procedure: LYMPH NODE BIOPSY;  Surgeon: Everitt Amber, MD;  Location: [facility];  Service: Gynecology;  Laterality: Bilateral;   OPEN REDUCTION INTERNAL FIXATION (ORIF) TIBIA/FIBULA FRACTURE Right 04/10/2020   Procedure: OPEN REDUCTION INTERNAL FIXATION (ORIF) TIBIA/FIBULA FRACTURE;  Surgeon: Shona Needles, MD;  Location: [facility];  Service:  Orthopedics;  Laterality: Right;   ROBOT ASSISTED MYOMECTOMY N/A 07/22/2019   Procedure: XI ROBOTIC ASSISTED LAPAROSCOPIC RADICAL UPPER VAGINECTOMY, LEFT SALPINECTOMY, RIGHT SALPINGOOOPHERECTOMY;  Surgeon: Everitt Amber, MD;  Location: [facility];  Service: Gynecology;  Laterality: N/A;   VAGINAL HYSTERECTOMY N/A 03/11/2017   Procedure: HYSTERECTOMY VAGINAL WITH MORCELLATION;  Surgeon: Chancy Milroy, MD;  Location: [facility];  Service: Gynecology;  Laterality: N/A;    Social History   Socioeconomic History   Marital status: Soil scientist    Spouse name: Not on file   Number of children: Not on file   Years of education: Not on file   Highest education level: Not on file  Occupational History   Not on file  Tobacco Use   Smoking status: Never   Smokeless tobacco: Never  Vaping Use    Vaping Use: Never used  Substance and Sexual Activity   Alcohol use: Not Currently   Drug use: No   Sexual activity: Yes    Birth control/protection: Post-menopausal, Surgical    Comment: perimenopausal; no sex in years  Other Topics Concern   Not on file  Social History Narrative   Not on file   Social Determinants of Health   Financial Resource Strain: Not on file  Food Insecurity: No Food Insecurity (08/19/2022)   Hunger Vital Sign    Worried About Running Out of Food in the Last Year: Never true    Ran Out of Food in the Last Year: Never true  Recent Concern: Food Insecurity - Food Insecurity Present (08/18/2022)   Hunger Vital Sign    Worried About Running Out of Food in the Last Year: Sometimes true    Ran Out of Food in the Last Year: Sometimes true  Transportation Needs: Unmet Transportation Needs (11/25/2022)   PRAPARE - Hydrologist (Medical): Yes    Lack of Transportation (Non-Medical): Yes  Physical Activity: Not on file  Stress: Not on file  Social Connections: Not on file  Intimate Partner Violence: Not At Risk (08/18/2022)   Humiliation, Afraid, Rape, and Kick questionnaire    Fear of Current or Ex-Partner: No    Emotionally Abused: No    Physically Abused: No    Sexually Abused: No    Family History  Problem Relation Age of Onset   Alcohol abuse Father    Cancer Father    Breast cancer Neg Hx      Current Outpatient Medications:    cyanocobalamin (VITAMIN B12) 1000 MCG/ML injection, Inject 1 mL (1,000 mcg total) into the muscle every 7 (seven) days. (Patient not taking: Reported on 11/20/2022), Disp: 1 mL, Rfl: 0   diazepam (VALIUM) 5 MG tablet, Take 1 tablet (5 mg total) by mouth every 12 (twelve) hours as needed (dizziness)., Disp: 10 tablet, Rfl: 0   gabapentin (NEURONTIN) 800 MG tablet, Take 800 mg by mouth 3 (three) times daily., Disp: , Rfl:    meclizine (ANTIVERT) 25 MG tablet, Take 1 tablet (25 mg total) by mouth 3  (three) times daily as needed for dizziness. (Patient not taking: Reported on 11/20/2022), Disp: 30 tablet, Rfl: 0   meclizine (ANTIVERT) 25 MG tablet, Take 1 tablet (25 mg total) by mouth 3 (three) times daily as needed for dizziness. (Patient not taking: Reported on 12/12/2022), Disp: 30 tablet, Rfl: 0   nitrofurantoin, macrocrystal-monohydrate, (MACROBID) 100 MG capsule, Take 1 capsule (100 mg total) by mouth at bedtime. Start taking after Keflex prescription is completed. (Patient not taking:  Reported on 11/20/2022), Disp: 21 capsule, Rfl: 0   ondansetron (ZOFRAN-ODT) 4 MG disintegrating tablet, Take 1 tablet (4 mg total) by mouth every 8 (eight) hours as needed. (Patient not taking: Reported on 12/12/2022), Disp: 20 tablet, Rfl: 0   oxybutynin (DITROPAN XL) 15 MG 24 hr tablet, Take 15 mg by mouth 2 (two) times daily. (Patient not taking: Reported on 12/12/2022), Disp: , Rfl:    Oxycodone HCl 10 MG TABS, Take 10 mg by mouth daily as needed., Disp: , Rfl:  No current facility-administered medications for this visit.  Facility-Administered Medications Ordered in Other Visits:    0.9 %  sodium chloride infusion, , Intravenous, Once, Gorsuch, Ni, MD   0.9 %  sodium chloride infusion, , Intravenous, Once, Gery Pray, MD  PHYSICAL EXAM: ECOG FS:1 - Symptomatic but completely ambulatory   T: 98    BP: 116/83    HR: 87      Resp: 18    SpO2:100 % Physical Exam Vitals and nursing note reviewed.  Constitutional:      Appearance: She is well-developed. She is not ill-appearing or toxic-appearing.     Comments: Airway is patent  HENT:     Head: Normocephalic.     Nose: Nose normal.     Mouth/Throat:     Mouth: No oral lesions or angioedema.     Pharynx: Uvula midline. No uvula swelling.  Eyes:     Conjunctiva/sclera: Conjunctivae normal.  Neck:     Vascular: No JVD.  Cardiovascular:     Rate and Rhythm: Normal rate and regular rhythm.     Pulses: Normal pulses.     Heart sounds: Normal heart  sounds.  Pulmonary:     Effort: Pulmonary effort is normal.     Breath sounds: Normal breath sounds.  Abdominal:     General: There is no distension.  Musculoskeletal:     Cervical back: Normal range of motion.  Skin:    General: Skin is warm and dry.     Findings: Rash present. Rash is macular.     Comments: Rash located in perioral region and left forearm  Neurological:     Mental Status: She is oriented to person, place, and time.        LABORATORY DATA: I have reviewed the data as listed    Latest Ref Rng & Units 11/21/2022   11:43 AM 11/08/2022   11:47 AM 10/25/2022    3:32 PM  CBC  WBC 4.0 - 10.5 K/uL 4.1  4.7  4.3   Hemoglobin 12.0 - 15.0 g/dL 15.6  14.4  15.0   Hematocrit 36.0 - 46.0 % 46.5  42.0  43.4   Platelets 150 - 400 K/uL 143  140  126         Latest Ref Rng & Units 11/21/2022    1:15 PM 11/08/2022   11:47 AM 10/11/2022   11:12 AM  CMP  Glucose 70 - 99 mg/dL 82  82  91   BUN 6 - 20 mg/dL '9  16  12   '$ Creatinine 0.44 - 1.00 mg/dL 0.55  0.62  0.59   Sodium 135 - 145 mmol/L 142  140  140   Potassium 3.5 - 5.1 mmol/L 3.5  4.9  5.4   Chloride 98 - 111 mmol/L 109  106  104   CO2 22 - 32 mmol/L '26  30  28   '$ Calcium 8.9 - 10.3 mg/dL 8.0  9.3  9.3   Total  Protein 6.5 - 8.1 g/dL 6.1  6.2    Total Bilirubin 0.3 - 1.2 mg/dL 0.8  0.6    Alkaline Phos 38 - 126 U/L 93  127    AST 15 - 41 U/L 17  24    ALT 0 - 44 U/L 14  27         RADIOGRAPHIC STUDIES (from last 24 hours if applicable) I have personally reviewed the radiological images as listed and agreed with the findings in the report. No results found.      Visit Diagnosis: 1. Complicated UTI (urinary tract infection)   2. Rash and nonspecific skin eruption   3. Vitamin B12 deficiency      No orders of the defined types were placed in this encounter.   All questions were answered. The patient knows to call the clinic with any problems, questions or concerns. No barriers to learning was  detected.  I have spent a total of 20 minutes minutes of face-to-face and non-face-to-face time, preparing to see the patient, obtaining and/or reviewing separately obtained history, performing a medically appropriate examination, counseling and educating the patient, ordering medications, documenting clinical information in the electronic health record, and care coordination (communications with other health care professionals or caregivers).    Thank you for allowing me to participate in the care of this patient.    Barrie Folk, PA-C Department of Hematology/Oncology [facility] at [facility] Phone: [phone]  [phone]    12/17/2022 9:36 PM

## 2022-12-17 NOTE — Addendum Note (Signed)
Addended by: Tora Kindred on: 12/17/2022 11:20 AM   Modules accepted: Orders

## 2022-12-17 NOTE — Addendum Note (Signed)
Addended by: Tora Kindred on: 12/17/2022 11:15 AM   Modules accepted: Orders

## 2022-12-17 NOTE — Telephone Encounter (Signed)
Left patient a vm regarding all upcoming appointments

## 2022-12-17 NOTE — Addendum Note (Signed)
Addended by: Tora Kindred on: 12/17/2022 11:19 AM   Modules accepted: Orders

## 2022-12-18 ENCOUNTER — Inpatient Hospital Stay: Payer: Medicaid Other

## 2022-12-18 ENCOUNTER — Ambulatory Visit: Payer: Medicaid Other | Admitting: Clinical

## 2022-12-18 ENCOUNTER — Inpatient Hospital Stay (HOSPITAL_BASED_OUTPATIENT_CLINIC_OR_DEPARTMENT_OTHER): Payer: Medicaid Other | Admitting: Physician Assistant

## 2022-12-18 DIAGNOSIS — F4321 Adjustment disorder with depressed mood: Secondary | ICD-10-CM

## 2022-12-18 DIAGNOSIS — N390 Urinary tract infection, site not specified: Secondary | ICD-10-CM | POA: Diagnosis not present

## 2022-12-18 MED ORDER — SODIUM CHLORIDE 0.9 % IV SOLN: INTRAVENOUS | Status: DC

## 2022-12-18 MED ORDER — DEXTROSE 5 % IV SOLN
1.0000 g | Freq: Once | INTRAVENOUS | Status: AC
  Administered 2022-12-18: 1 g via INTRAVENOUS
  Filled 2022-12-18: qty 10
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
## 2022-12-18 NOTE — Progress Notes (Signed)
Symptom Management Consult note [place]    Patient Care Team: [name], NP as PCP - General (Nurse Practitioner)    Name / MRN / DOB: Jordan [name]  [id number]  23-Sep-1966   Date of visit: 12/18/2022   Chief Complaint/Reason for visit: recheck rash   Current Therapy: B12 injections    ASSESSMENT & PLAN: Patient is a 57 y.o. 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 Alvy Bimler.  I have viewed most recent oncology note and lab work.    #Complicated UTI - Patient seen yesterday during IV rocephin infusion for new rash that was ?allergic reaction. Based on patient's history and multiple rocephin infusions that were tolerated well it was felt to be unlikely a new allergy. - On exam today rash has resolved and she denies any new symptoms. - Patient can continue rocephin treatment as originally planned.  - Encouraged her to seek ED evaluation if any concerning symptoms develop while outside of the cancer center.   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  This revealed that she was status post hysterectomy with no evidence of metastatic disease in the abdomen or pelvis or chest.   06/26/2019 Imaging   MRI of the pelvis with IV contrast: susceptibility artifact in the vaginal cuff from suture material limiting evaluation.  However no focal soft tissue masses were seen involving the vaginal cuff and no abnormal soft tissue densities in the parametrial regions.  The right and left ovaries were grossly normal.   07/22/2019 Surgery   Robotic assisted radical upper vaginectomy with bilateral pelvic sentinel lymph node biopsy, right salpingo-oophorectomy, left  salpingectomy.  Intraoperative findings were significant for no grossly visible vaginal lesions.  The ovaries and fallopian tubes were adherent to the rectum and vaginal cuff in the obliterated cul-de-sac.  The ovaries themselves are grossly normal-appearing but densely adherent with no suspicious lymph nodes.  There is clinical stage I disease, microscopic.   07/22/2019 Cancer Staging   Postoperatively final pathology returned as FIGO stage Ia microscopic poorly differentiated squamous cell carcinoma.  The tumor site was the posterior vaginal wall and measured 0.6 cm.  The posterior margin (the posterior vaginal cuff margin) was broadly positive for tumor cells.  The deep margin was negative.  There was 2.5 mm depth of invasion.  All sentinel lymph nodes were negative for metastases, as was the left fallopian tube and right tube and ovary.   08/24/2019 - 09/22/2019 Radiation Therapy   Vaginal brachytherapy   09/15/2020 PET scan   Soft tissue attenuating structure is noted within the left pelvis measuring 1.7 cm. This was favored to be a left ovary. No clear evidence for recurrence.    01/01/2021 Imaging   Pelvic MRI 1. No evidence of local tumor recurrence at the vaginal resection margin. 2. No evidence of metastatic disease in the pelvis. 3. Left adnexal 2.3 x 1.6 x 1.6 cm structure is compatible with an atrophic left ovary, unchanged from 08/31/2020 outside CT. 4. Chronic mild diffuse bladder wall thickening, unchanged, potentially the sequela of prior radiation therapy       Interval history-: Jordan [name] is a 57 y.o. female with oncologic history as  above presenting to [facility] today with chief complaint of      ROS  All other systems are reviewed and are negative for acute change except as noted in the HPI.    Allergies  Allergen Reactions   Ciprofloxacin Swelling    Lips swell, tongue swells, face swells   Citalopram Other (See Comments)    Possible cause of pancytopenia.  Swelling of tongue, face and throat   Lamotrigine Other (See Comments)    Possible cause of pancytopenia. Swelling of face, throat and tongue   Sulfa Antibiotics Anaphylaxis   Tramadol Anaphylaxis, Shortness Of Breath and Swelling     Past Medical History:  Diagnosis Date   Alcohol abuse    Anemia    patient denies   Bipolar 1 disorder ([place])    No medications currently   CAP (community acquired pneumonia) 03/17/2015   History of radiation therapy 08/24/19-09/22/19   Vaginal brachytherapy   Dr. 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   Mental disorder    Obesity    PICC line infection 05/17/2015   Sepsis due to Gram negative bacteria (MDR E Coli) 02/18/2015   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HCC)    UTI (lower urinary tract infection)    Vaginal Pap smear, abnormal      Past Surgical History:  Procedure Laterality Date   ABDOMINAL HYSTERECTOMY     CERVICAL CONIZATION W/BX N/A 07/01/2016   Procedure: CONIZATION CERVIX WITH BIOPSY;  Surgeon: Chancy Milroy, MD;  Location: [facility];  Service: Gynecology;  Laterality: N/A;   CHOLECYSTECTOMY     EXTERNAL FIXATION LEG Right 04/09/2020   Procedure: EXTERNAL FIXATION LEG;  Surgeon: Hiram Gash, MD;  Location: [facility];  Service: Orthopedics;  Laterality: Right;   HYSTEROSCOPY WITH D & C N/A 01/25/2016   Procedure: DILATATION AND CURETTAGE /HYSTEROSCOPY;  Surgeon: Mora Bellman, MD;  Location: [facility];  Service: Gynecology;  Laterality: N/A;   LYMPH NODE BIOPSY Bilateral 07/22/2019   Procedure: LYMPH NODE BIOPSY;  Surgeon: Everitt Amber, MD;  Location: [facility];  Service: Gynecology;  Laterality: Bilateral;   OPEN REDUCTION INTERNAL FIXATION (ORIF) TIBIA/FIBULA FRACTURE Right 04/10/2020   Procedure: OPEN REDUCTION INTERNAL FIXATION (ORIF) TIBIA/FIBULA FRACTURE;  Surgeon: Shona Needles, MD;  Location: [facility];  Service: Orthopedics;  Laterality: Right;   ROBOT ASSISTED MYOMECTOMY N/A 07/22/2019   Procedure: XI  ROBOTIC ASSISTED LAPAROSCOPIC RADICAL UPPER VAGINECTOMY, LEFT SALPINECTOMY, RIGHT SALPINGOOOPHERECTOMY;  Surgeon: Everitt Amber, MD;  Location: [facility];  Service: Gynecology;  Laterality: N/A;   VAGINAL HYSTERECTOMY N/A 03/11/2017   Procedure: HYSTERECTOMY VAGINAL WITH MORCELLATION;  Surgeon: Chancy Milroy, MD;  Location: [facility];  Service: Gynecology;  Laterality: N/A;    Social History   Socioeconomic History   Marital status: Soil scientist    Spouse name: Not on file   Number of children: Not on file   Years of education: Not on file   Highest education level: Not on file  Occupational History   Not on file  Tobacco Use   Smoking status: Never   Smokeless tobacco: Never  Vaping Use   Vaping Use: Never used  Substance and Sexual Activity   Alcohol use: Not Currently   Drug use: No   Sexual activity: Yes    Birth control/protection: Post-menopausal, Surgical    Comment: perimenopausal; no sex in years  Other Topics Concern   Not on file  Social History Narrative   Not on file  Social Determinants of Health   Financial Resource Strain: Not on file  Food Insecurity: No Food Insecurity (08/19/2022)   Hunger Vital Sign    Worried About Running Out of Food in the Last Year: Never true    Ran Out of Food in the Last Year: Never true  Recent Concern: Food Insecurity - Food Insecurity Present (08/18/2022)   Hunger Vital Sign    Worried About Running Out of Food in the Last Year: Sometimes true    Ran Out of Food in the Last Year: Sometimes true  Transportation Needs: Unmet Transportation Needs (11/25/2022)   PRAPARE - Hydrologist (Medical): Yes    Lack of Transportation (Non-Medical): Yes  Physical Activity: Not on file  Stress: Not on file  Social Connections: Not on file  Intimate Partner Violence: Not At Risk (08/18/2022)   Humiliation, Afraid, Rape, and Kick questionnaire    Fear of Current or Ex-Partner: No    Emotionally Abused: No     Physically Abused: No    Sexually Abused: No    Family History  Problem Relation Age of Onset   Alcohol abuse Father    Cancer Father    Breast cancer Neg Hx      Current Outpatient Medications:    cyanocobalamin (VITAMIN B12) 1000 MCG/ML injection, Inject 1 mL (1,000 mcg total) into the muscle every 7 (seven) days. (Patient not taking: Reported on 11/20/2022), Disp: 1 mL, Rfl: 0   diazepam (VALIUM) 5 MG tablet, Take 1 tablet (5 mg total) by mouth every 12 (twelve) hours as needed (dizziness)., Disp: 10 tablet, Rfl: 0   gabapentin (NEURONTIN) 800 MG tablet, Take 800 mg by mouth 3 (three) times daily., Disp: , Rfl:    meclizine (ANTIVERT) 25 MG tablet, Take 1 tablet (25 mg total) by mouth 3 (three) times daily as needed for dizziness. (Patient not taking: Reported on 11/20/2022), Disp: 30 tablet, Rfl: 0   meclizine (ANTIVERT) 25 MG tablet, Take 1 tablet (25 mg total) by mouth 3 (three) times daily as needed for dizziness. (Patient not taking: Reported on 12/12/2022), Disp: 30 tablet, Rfl: 0   nitrofurantoin, macrocrystal-monohydrate, (MACROBID) 100 MG capsule, Take 1 capsule (100 mg total) by mouth at bedtime. Start taking after Keflex prescription is completed. (Patient not taking: Reported on 11/20/2022), Disp: 21 capsule, Rfl: 0   ondansetron (ZOFRAN-ODT) 4 MG disintegrating tablet, Take 1 tablet (4 mg total) by mouth every 8 (eight) hours as needed. (Patient not taking: Reported on 12/12/2022), Disp: 20 tablet, Rfl: 0   oxybutynin (DITROPAN XL) 15 MG 24 hr tablet, Take 15 mg by mouth 2 (two) times daily. (Patient not taking: Reported on 12/12/2022), Disp: , Rfl:    Oxycodone HCl 10 MG TABS, Take 10 mg by mouth daily as needed., Disp: , Rfl:  No current facility-administered medications for this visit.  Facility-Administered Medications Ordered in Other Visits:    0.9 %  sodium chloride infusion, , Intravenous, Once, Gorsuch, Ni, MD   0.9 %  sodium chloride infusion, , Intravenous, Continuous,  Gery Pray, MD, Last Rate: 20 mL/hr at 12/18/22 1529, New Bag at 12/18/22 1529  PHYSICAL EXAM: ECOG FS:1 - Symptomatic but completely ambulatory   T: 99.1    BP: 130/91    HR: 90   Resp: 18  SpO2: 99% RA Physical Exam Vitals and nursing note reviewed.  Constitutional:      Appearance: She is well-developed. She is not ill-appearing or toxic-appearing.  Comments: Airway patent  HENT:     Head: Normocephalic.     Nose: Nose normal.     Mouth/Throat:     Mouth: Mucous membranes are moist.     Pharynx: Oropharynx is clear. No posterior oropharyngeal erythema.  Neck:     Vascular: No JVD.  Cardiovascular:     Rate and Rhythm: Normal rate.     Pulses: Normal pulses.  Pulmonary:     Effort: Pulmonary effort is normal.  Abdominal:     General: There is no distension.  Musculoskeletal:        General: Normal range of motion.     Cervical back: Normal range of motion.  Skin:    General: Skin is warm and dry.     Findings: No rash.  Neurological:     Mental Status: She is oriented to person, place, and time.        LABORATORY DATA: I have reviewed the data as listed    Latest Ref Rng & Units 11/21/2022   11:43 AM 11/08/2022   11:47 AM 10/25/2022    3:32 PM  CBC  WBC 4.0 - 10.5 K/uL 4.1  4.7  4.3   Hemoglobin 12.0 - 15.0 g/dL 15.6  14.4  15.0   Hematocrit 36.0 - 46.0 % 46.5  42.0  43.4   Platelets 150 - 400 K/uL 143  140  126         Latest Ref Rng & Units 11/21/2022    1:15 PM 11/08/2022   11:47 AM 10/11/2022   11:12 AM  CMP  Glucose 70 - 99 mg/dL 82  82  91   BUN 6 - 20 mg/dL '9  16  12   '$ Creatinine 0.44 - 1.00 mg/dL 0.55  0.62  0.59   Sodium 135 - 145 mmol/L 142  140  140   Potassium 3.5 - 5.1 mmol/L 3.5  4.9  5.4   Chloride 98 - 111 mmol/L 109  106  104   CO2 22 - 32 mmol/L '26  30  28   '$ Calcium 8.9 - 10.3 mg/dL 8.0  9.3  9.3   Total Protein 6.5 - 8.1 g/dL 6.1  6.2    Total Bilirubin 0.3 - 1.2 mg/dL 0.8  0.6    Alkaline Phos 38 - 126 U/L 93  127    AST 15 -  41 U/L 17  24    ALT 0 - 44 U/L 14  27         RADIOGRAPHIC STUDIES (from last 24 hours if applicable) I have personally reviewed the radiological images as listed and agreed with the findings in the report. No results found.      Visit Diagnosis: 1. Complicated UTI (urinary tract infection)      No orders of the defined types were placed in this encounter.   All questions were answered. The patient knows to call the clinic with any problems, questions or concerns. No barriers to learning was detected.  I have spent a total of 10 minutes minutes of face-to-face and non-face-to-face time, preparing to see the patient, obtaining and/or reviewing separately obtained history, performing a medically appropriate examination, counseling and educating the patient, ordering tests, documenting clinical information in the electronic health record, and care coordination (communications with other health care professionals or caregivers).    Thank you for allowing me to participate in the care of this patient.    Barrie Folk, PA-C Department of Hematology/Oncology [facility] [place]  [place] at [facility] Phone: [phone]  [phone]    12/18/2022 4:31 PM

## 2022-12-18 NOTE — Patient Instructions (Signed)
Ceftriaxone Injection What is this medication? CEFTRIAXONE (sef try AX one) treats infections caused by bacteria. It belongs to a group of medications called cephalosporin antibiotics. It will not treat colds, the flu, or infections caused by viruses. This medicine may be used for other purposes; ask your health care provider or pharmacist if you have questions. COMMON BRAND NAME(S): Ceftrisol Plus, Rocephin What should I tell my care team before I take this medication? They need to know if you have any of these conditions: Bleeding disorder High bilirubin level in newborn patients Kidney disease Liver disease Poor nutrition An unusual or allergic reaction to ceftriaxone, other penicillin or cephalosporin antibiotics, other medications, foods, dyes, or preservatives Pregnant or trying to get pregnant Breast-feeding How should I use this medication? This medication is injected into a vein or a muscle. It is usually given by your care team in a hospital or clinic setting. It may also be given at home. If you get this medication at home, you will be taught how to prepare and give it. Use exactly as directed. Take it as directed on the prescription label at the same time every day. Keep taking it even if you think you are better. It is important that you put your used needles and syringes in a special sharps container. Do not put them in a trash can. If you do not have a sharps container, call your pharmacist or care team to get one. Talk to your care team about the use of this medication in children. While it may be prescribed for children as young as newborns for selected conditions, precautions do apply. Overdosage: If you think you have taken too much of this medicine contact a poison control center or emergency room at once. NOTE: This medicine is only for you. Do not share this medicine with others. What if I miss a dose? If you get this medication at the hospital or clinic: It is important  not to miss your dose. Call your care team if you are unable to keep an appointment. If you give yourself this medication at home: If you miss a dose, take it as soon as you can. Then continue your normal schedule. If it is almost time for your next dose, take only that dose. Do not take double or extra doses. Call your care team with questions. What may interact with this medication? Estrogen or progestin hormones Intravenous calcium This list may not describe all possible interactions. Give your health care provider a list of all the medicines, herbs, non-prescription drugs, or dietary supplements you use. Also tell them if you smoke, drink alcohol, or use illegal drugs. Some items may interact with your medicine. What should I watch for while using this medication? Tell your care team if your symptoms do not start to get better or if they get worse. Do not treat diarrhea with over the counter products. Contact your care team if you have diarrhea that lasts more than 2 days or if it is severe and watery. If you have diabetes, you may get a false-positive result for sugar in your urine. Check with your care team. If you are being treated for a sexually transmitted infection (STI), avoid sexual contact until you have finished your treatment. Your partner may also need treatment. What side effects may I notice from receiving this medication? Side effects that you should report to your care team as soon as possible: Allergic reactions--skin rash, itching, hives, swelling of the face, lips, tongue, or throat   Hemolytic anemia--unusual weakness or fatigue, dizziness, headache, trouble breathing, dark urine, yellowing skin or eyes Severe diarrhea, fever Unusual vaginal discharge, itching, or odor Side effects that usually do not require medical attention (report to your care team if they continue or are bothersome): Diarrhea Headache Nausea Pain, redness, or irritation at injection site This list may  not describe all possible side effects. Call your doctor for medical advice about side effects. You may report side effects to FDA at 1-800-FDA-1088. Where should I keep my medication? Keep out of the reach of children and pets. You will be instructed on how to store this medication. Get rid of any unused medication after the expiration date. To get rid of medications that are no longer needed or have expired: Take the medication to a medication take-back program. Check with your pharmacy or law enforcement to find a location. If you cannot return the medication, ask your pharmacist or care team how to get rid of this medication safely. NOTE: This sheet is a summary. It may not cover all possible information. If you have questions about this medicine, talk to your doctor, pharmacist, or health care provider.  2023 Elsevier/Gold Standard (2021-12-17 00:00:00)  

## 2022-12-19 ENCOUNTER — Inpatient Hospital Stay: Payer: Medicaid Other

## 2022-12-19 ENCOUNTER — Ambulatory Visit: Payer: Medicaid Other | Admitting: Family Medicine

## 2022-12-19 DIAGNOSIS — N390 Urinary tract infection, site not specified: Secondary | ICD-10-CM

## 2022-12-19 MED ORDER — DEXTROSE 5 % IV SOLN
1.0000 g | Freq: Once | INTRAVENOUS | Status: AC
  Administered 2022-12-19: 1 g via INTRAVENOUS
  Filled 2022-12-19: qty 10

## 2022-12-19 MED ORDER — SODIUM CHLORIDE 0.9 % IV SOLN: Freq: Once | INTRAVENOUS | Status: AC

## 2022-12-19 NOTE — Patient Instructions (Signed)
Ceftriaxone Injection What is this medication? CEFTRIAXONE (sef try AX one) treats infections caused by bacteria. It belongs to a group of medications called cephalosporin antibiotics. It will not treat colds, the flu, or infections caused by viruses. This medicine may be used for other purposes; ask your health care provider or pharmacist if you have questions. COMMON BRAND NAME(S): Ceftrisol Plus, Rocephin What should I tell my care team before I take this medication? They need to know if you have any of these conditions: Bleeding disorder High bilirubin level in newborn patients Kidney disease Liver disease Poor nutrition An unusual or allergic reaction to ceftriaxone, other penicillin or cephalosporin antibiotics, other medications, foods, dyes, or preservatives Pregnant or trying to get pregnant Breast-feeding How should I use this medication? This medication is injected into a vein or a muscle. It is usually given by your care team in a hospital or clinic setting. It may also be given at home. If you get this medication at home, you will be taught how to prepare and give it. Use exactly as directed. Take it as directed on the prescription label at the same time every day. Keep taking it even if you think you are better. It is important that you put your used needles and syringes in a special sharps container. Do not put them in a trash can. If you do not have a sharps container, call your pharmacist or care team to get one. Talk to your care team about the use of this medication in children. While it may be prescribed for children as young as newborns for selected conditions, precautions do apply. Overdosage: If you think you have taken too much of this medicine contact a poison control center or emergency room at once. NOTE: This medicine is only for you. Do not share this medicine with others. What if I miss a dose? If you get this medication at the hospital or clinic: It is important  not to miss your dose. Call your care team if you are unable to keep an appointment. If you give yourself this medication at home: If you miss a dose, take it as soon as you can. Then continue your normal schedule. If it is almost time for your next dose, take only that dose. Do not take double or extra doses. Call your care team with questions. What may interact with this medication? Estrogen or progestin hormones Intravenous calcium This list may not describe all possible interactions. Give your health care provider a list of all the medicines, herbs, non-prescription drugs, or dietary supplements you use. Also tell them if you smoke, drink alcohol, or use illegal drugs. Some items may interact with your medicine. What should I watch for while using this medication? Tell your care team if your symptoms do not start to get better or if they get worse. Do not treat diarrhea with over the counter products. Contact your care team if you have diarrhea that lasts more than 2 days or if it is severe and watery. If you have diabetes, you may get a false-positive result for sugar in your urine. Check with your care team. If you are being treated for a sexually transmitted infection (STI), avoid sexual contact until you have finished your treatment. Your partner may also need treatment. What side effects may I notice from receiving this medication? Side effects that you should report to your care team as soon as possible: Allergic reactions--skin rash, itching, hives, swelling of the face, lips, tongue, or throat   Hemolytic anemia--unusual weakness or fatigue, dizziness, headache, trouble breathing, dark urine, yellowing skin or eyes Severe diarrhea, fever Unusual vaginal discharge, itching, or odor Side effects that usually do not require medical attention (report to your care team if they continue or are bothersome): Diarrhea Headache Nausea Pain, redness, or irritation at injection site This list may  not describe all possible side effects. Call your doctor for medical advice about side effects. You may report side effects to FDA at 1-800-FDA-1088. Where should I keep my medication? Keep out of the reach of children and pets. You will be instructed on how to store this medication. Get rid of any unused medication after the expiration date. To get rid of medications that are no longer needed or have expired: Take the medication to a medication take-back program. Check with your pharmacy or law enforcement to find a location. If you cannot return the medication, ask your pharmacist or care team how to get rid of this medication safely. NOTE: This sheet is a summary. It may not cover all possible information. If you have questions about this medicine, talk to your doctor, pharmacist, or health care provider.  2023 Elsevier/Gold Standard (2021-12-17 00:00:00)  

## 2022-12-20 ENCOUNTER — Inpatient Hospital Stay: Payer: Medicaid Other | Attending: Radiation Oncology

## 2022-12-20 ENCOUNTER — Other Ambulatory Visit: Payer: Self-pay

## 2022-12-20 ENCOUNTER — Inpatient Hospital Stay: Payer: Medicaid Other

## 2022-12-20 DIAGNOSIS — N390 Urinary tract infection, site not specified: Secondary | ICD-10-CM

## 2022-12-20 MED ORDER — DEXTROSE 5 % IV SOLN
1.0000 g | Freq: Once | INTRAVENOUS | Status: AC
  Administered 2022-12-20: 1 g via INTRAVENOUS
  Filled 2022-12-20: qty 10

## 2022-12-20 MED FILL — Ceftriaxone Sodium For Inj 2 GM: INTRAMUSCULAR | Qty: 10 | Status: AC

## 2022-12-20 NOTE — Patient Instructions (Signed)
Ceftriaxone Injection What is this medication? CEFTRIAXONE (sef try AX one) treats infections caused by bacteria. It belongs to a group of medications called cephalosporin antibiotics. It will not treat colds, the flu, or infections caused by viruses. This medicine may be used for other purposes; ask your health care provider or pharmacist if you have questions. COMMON BRAND NAME(S): Ceftrisol Plus, Rocephin What should I tell my care team before I take this medication? They need to know if you have any of these conditions: Bleeding disorder High bilirubin level in newborn patients Kidney disease Liver disease Poor nutrition An unusual or allergic reaction to ceftriaxone, other penicillin or cephalosporin antibiotics, other medications, foods, dyes, or preservatives Pregnant or trying to get pregnant Breast-feeding How should I use this medication? This medication is injected into a vein or a muscle. It is usually given by your care team in a hospital or clinic setting. It may also be given at home. If you get this medication at home, you will be taught how to prepare and give it. Use exactly as directed. Take it as directed on the prescription label at the same time every day. Keep taking it even if you think you are better. It is important that you put your used needles and syringes in a special sharps container. Do not put them in a trash can. If you do not have a sharps container, call your pharmacist or care team to get one. Talk to your care team about the use of this medication in children. While it may be prescribed for children as young as newborns for selected conditions, precautions do apply. Overdosage: If you think you have taken too much of this medicine contact a poison control center or emergency room at once. NOTE: This medicine is only for you. Do not share this medicine with others. What if I miss a dose? If you get this medication at the hospital or clinic: It is important  not to miss your dose. Call your care team if you are unable to keep an appointment. If you give yourself this medication at home: If you miss a dose, take it as soon as you can. Then continue your normal schedule. If it is almost time for your next dose, take only that dose. Do not take double or extra doses. Call your care team with questions. What may interact with this medication? Estrogen or progestin hormones Intravenous calcium This list may not describe all possible interactions. Give your health care provider a list of all the medicines, herbs, non-prescription drugs, or dietary supplements you use. Also tell them if you smoke, drink alcohol, or use illegal drugs. Some items may interact with your medicine. What should I watch for while using this medication? Tell your care team if your symptoms do not start to get better or if they get worse. Do not treat diarrhea with over the counter products. Contact your care team if you have diarrhea that lasts more than 2 days or if it is severe and watery. If you have diabetes, you may get a false-positive result for sugar in your urine. Check with your care team. If you are being treated for a sexually transmitted infection (STI), avoid sexual contact until you have finished your treatment. Your partner may also need treatment. What side effects may I notice from receiving this medication? Side effects that you should report to your care team as soon as possible: Allergic reactions--skin rash, itching, hives, swelling of the face, lips, tongue, or throat   Hemolytic anemia--unusual weakness or fatigue, dizziness, headache, trouble breathing, dark urine, yellowing skin or eyes Severe diarrhea, fever Unusual vaginal discharge, itching, or odor Side effects that usually do not require medical attention (report to your care team if they continue or are bothersome): Diarrhea Headache Nausea Pain, redness, or irritation at injection site This list may  not describe all possible side effects. Call your doctor for medical advice about side effects. You may report side effects to FDA at 1-800-FDA-1088. Where should I keep my medication? Keep out of the reach of children and pets. You will be instructed on how to store this medication. Get rid of any unused medication after the expiration date. To get rid of medications that are no longer needed or have expired: Take the medication to a medication take-back program. Check with your pharmacy or law enforcement to find a location. If you cannot return the medication, ask your pharmacist or care team how to get rid of this medication safely. NOTE: This sheet is a summary. It may not cover all possible information. If you have questions about this medicine, talk to your doctor, pharmacist, or health care provider.  2023 Elsevier/Gold Standard (2021-12-17 00:00:00)  

## 2022-12-21 ENCOUNTER — Inpatient Hospital Stay: Payer: Medicaid Other

## 2022-12-21 VITALS — BP 129/80 | HR 80 | Temp 98.0°F | Resp 18

## 2022-12-21 DIAGNOSIS — E538 Deficiency of other specified B group vitamins: Secondary | ICD-10-CM

## 2022-12-21 DIAGNOSIS — N390 Urinary tract infection, site not specified: Secondary | ICD-10-CM

## 2022-12-21 MED ORDER — DEXTROSE 5 % IV SOLN
1.0000 g | Freq: Once | INTRAVENOUS | Status: AC
  Administered 2022-12-21: 1 g via INTRAVENOUS
  Filled 2022-12-21: qty 10

## 2022-12-21 MED ORDER — SODIUM CHLORIDE 0.9 % IV SOLN: INTRAVENOUS | Status: DC

## 2022-12-21 NOTE — Patient Instructions (Signed)
Ceftriaxone Injection What is this medication? CEFTRIAXONE (sef try AX one) treats infections caused by bacteria. It belongs to a group of medications called cephalosporin antibiotics. It will not treat colds, the flu, or infections caused by viruses. This medicine may be used for other purposes; ask your health care provider or pharmacist if you have questions. COMMON BRAND NAME(S): Ceftrisol Plus, Rocephin What should I tell my care team before I take this medication? They need to know if you have any of these conditions: Bleeding disorder High bilirubin level in newborn patients Kidney disease Liver disease Poor nutrition An unusual or allergic reaction to ceftriaxone, other penicillin or cephalosporin antibiotics, other medications, foods, dyes, or preservatives Pregnant or trying to get pregnant Breast-feeding How should I use this medication? This medication is injected into a vein or a muscle. It is usually given by your care team in a hospital or clinic setting. It may also be given at home. If you get this medication at home, you will be taught how to prepare and give it. Use exactly as directed. Take it as directed on the prescription label at the same time every day. Keep taking it even if you think you are better. It is important that you put your used needles and syringes in a special sharps container. Do not put them in a trash can. If you do not have a sharps container, call your pharmacist or care team to get one. Talk to your care team about the use of this medication in children. While it may be prescribed for children as young as newborns for selected conditions, precautions do apply. Overdosage: If you think you have taken too much of this medicine contact a poison control center or emergency room at once. NOTE: This medicine is only for you. Do not share this medicine with others. What if I miss a dose? If you get this medication at the hospital or clinic: It is important  not to miss your dose. Call your care team if you are unable to keep an appointment. If you give yourself this medication at home: If you miss a dose, take it as soon as you can. Then continue your normal schedule. If it is almost time for your next dose, take only that dose. Do not take double or extra doses. Call your care team with questions. What may interact with this medication? Estrogen or progestin hormones Intravenous calcium This list may not describe all possible interactions. Give your health care provider a list of all the medicines, herbs, non-prescription drugs, or dietary supplements you use. Also tell them if you smoke, drink alcohol, or use illegal drugs. Some items may interact with your medicine. What should I watch for while using this medication? Tell your care team if your symptoms do not start to get better or if they get worse. Do not treat diarrhea with over the counter products. Contact your care team if you have diarrhea that lasts more than 2 days or if it is severe and watery. If you have diabetes, you may get a false-positive result for sugar in your urine. Check with your care team. If you are being treated for a sexually transmitted infection (STI), avoid sexual contact until you have finished your treatment. Your partner may also need treatment. What side effects may I notice from receiving this medication? Side effects that you should report to your care team as soon as possible: Allergic reactions--skin rash, itching, hives, swelling of the face, lips, tongue, or throat   Hemolytic anemia--unusual weakness or fatigue, dizziness, headache, trouble breathing, dark urine, yellowing skin or eyes Severe diarrhea, fever Unusual vaginal discharge, itching, or odor Side effects that usually do not require medical attention (report to your care team if they continue or are bothersome): Diarrhea Headache Nausea Pain, redness, or irritation at injection site This list may  not describe all possible side effects. Call your doctor for medical advice about side effects. You may report side effects to FDA at 1-800-FDA-1088. Where should I keep my medication? Keep out of the reach of children and pets. You will be instructed on how to store this medication. Get rid of any unused medication after the expiration date. To get rid of medications that are no longer needed or have expired: Take the medication to a medication take-back program. Check with your pharmacy or law enforcement to find a location. If you cannot return the medication, ask your pharmacist or care team how to get rid of this medication safely. NOTE: This sheet is a summary. It may not cover all possible information. If you have questions about this medicine, talk to your doctor, pharmacist, or health care provider.  2023 Elsevier/Gold Standard (2021-12-17 00:00:00)  

## 2022-12-23 ENCOUNTER — Ambulatory Visit (HOSPITAL_BASED_OUTPATIENT_CLINIC_OR_DEPARTMENT_OTHER): Payer: Medicaid Other | Admitting: Internal Medicine

## 2022-12-23 ENCOUNTER — Telehealth: Payer: Self-pay

## 2022-12-23 NOTE — Telephone Encounter (Signed)
Returned her call. She is complaining of being cold. It started yesterday. The cold feeling comes and goes. She has not checked her temperature, but she does not think that she has a fever. She has not checked her temperature. She recently got IV antibiotics for UTI from Dr. Sondra Come. Instructed her to call PCP or she can call Dr. Sondra Come. She verbalized understanding.

## 2022-12-25 ENCOUNTER — Telehealth: Payer: Self-pay

## 2022-12-25 ENCOUNTER — Encounter: Payer: Self-pay | Admitting: Radiation Oncology

## 2022-12-25 ENCOUNTER — Ambulatory Visit: Payer: Medicaid Other

## 2022-12-25 NOTE — Telephone Encounter (Signed)
Pt made aware. She is in agreement.

## 2022-12-25 NOTE — Telephone Encounter (Signed)
Received message from [facility], LPN in radiation stating, "Good morning, this patient called me stating that she thinks she may need more vitamin B12. I see that she spoke with Harrel Lemon on 3/4. Are you able to let Dr. Alvy Bimler know?"  I called pt to obtain more information. Pt reports she often feels like she is freezing then she will feel really hot. She is concerned this could be related to her hx of anemia. She also verbalized wanting to have more B12 injections. Advised pt I would consult with MD and we would be back in touch with recommendation. She verbalized thanks and understanding.

## 2022-12-25 NOTE — Telephone Encounter (Signed)
Her B12 level from January was extremely high so we space out her treatment to every 6 months The appt currently scheduled is accurate, she does not need earlier appt Please let her know

## 2022-12-26 ENCOUNTER — Encounter: Payer: Self-pay | Admitting: Radiation Oncology

## 2023-01-01 ENCOUNTER — Ambulatory Visit: Payer: Medicaid Other | Admitting: Clinical

## 2023-01-01 ENCOUNTER — Ambulatory Visit: Payer: Medicaid Other

## 2023-01-01 DIAGNOSIS — F4321 Adjustment disorder with depressed mood: Secondary | ICD-10-CM

## 2023-01-01 DIAGNOSIS — Z658 Other specified problems related to psychosocial circumstances: Secondary | ICD-10-CM

## 2023-01-01 NOTE — BH Specialist Note (Signed)
Integrated Behavioral Health Follow Up In-Person Visit  I reviewed patient visit with the [facility] Intern, and I concur with the treatment plan, as documented in the [facility] Intern note.   No charge for this visit due to [facility] Intern seeing patient.  Vesta Mixer, MSW, [place] [facility] [employer] [facility]   MRN: [id number] Name: Jordan [name]  Number of [facility] Clinician visits: 1- Initial Visit  Session Start time: [id number]   Session End time: [id number]  Total time in minutes: 60   Types of Service: Individual psychotherapy  Interpretor:No.   Subjective: Patient was referred by Dr.Ervin for Grief. Patient reports the following symptoms/concerns: Concerns regarding the death of her long term partner and her daughter's recent move into her own apartment in [place].   Duration of problem: ongoing; Severity of problem: moderate  Objective: Mood: Anxious, Depressed, and Hopeless and Affect: Appropriate, Depressed, and Tearful Risk of harm to self or others: No plan to harm self or others  Life Context: Family and Social: Pt currently contemplating moving to home state to be with daughter or daughter coming to live with her in [place] School/Work: pt currently receiving disability, pt cooks and cleans her apartment to stay busy  Self-Care: Pt spends time with her landlord and calls her daughter on the phone    Patient and/or Family's Strengths/Protective Factors: Concrete supports in place (healthy food, safe environments, etc.) and Sense of purpose  Goals Addressed: Patient will:  Reduce symptoms of: anxiety, depression, and stress   Increase knowledge and/or ability of: coping skills and stress reduction   Demonstrate ability to: Increase healthy adjustment to current life circumstances, Increase adequate support systems for patient/family, Increase motivation to adhere to plan of care, Improve  medication compliance, and Begin healthy grieving over loss  Progress towards Goals: Ongoing  Interventions: Interventions utilized:  Supportive Counseling, Psychoeducation and/or Health Education, and Supportive Reflection Standardized Assessments completed: Not Needed  Patient and/or Family Response: Pt agrees with treatment plan    Plan: Follow up with behavioral health clinician on : [facility] Intern will call pt on 3/14 to schedule follow up  Behavioral recommendations: -Continue prioritizing healthy self-care (regular meals, adequate rest; allowing practical help from supportive friends and family) until   - Try calling daughter to talk about living together  Referral(s): [facility] (In Clinic)   [place]

## 2023-01-02 ENCOUNTER — Other Ambulatory Visit: Payer: Self-pay

## 2023-01-02 ENCOUNTER — Ambulatory Visit: Payer: Medicaid Other

## 2023-01-02 ENCOUNTER — Telehealth: Payer: Self-pay

## 2023-01-02 DIAGNOSIS — N390 Urinary tract infection, site not specified: Secondary | ICD-10-CM

## 2023-01-02 NOTE — Telephone Encounter (Signed)
Patient called in to report that she continues to have dysuria and a foul vaginal odor. Patient was seen by urology on 12/30/22. UA was performed in that office.  Patient wants to know what else could be done to help with UTI symptoms. Noted that urologist prescribed prophylactic antibiotic which patient states she will not take due to side effects. Please advise

## 2023-01-02 NOTE — Telephone Encounter (Signed)
Called patient to go over Dr. Clabe Seal recommendations, per Dr. Sondra Come patient to be referred to infectious disease due to recurrent UTI's, patient to come in to have repeat UA c&S. Patient agreed to come in on 01/03/23 for UA. Will call once infectious disease appointment scheduled. Patient voiced understanding.

## 2023-01-03 ENCOUNTER — Other Ambulatory Visit: Payer: Self-pay

## 2023-01-03 ENCOUNTER — Inpatient Hospital Stay: Payer: Medicaid Other

## 2023-01-03 ENCOUNTER — Ambulatory Visit
Admission: RE | Admit: 2023-01-03 | Discharge: 2023-01-03 | Disposition: A | Discharge status: Home / Self Care | Payer: Medicaid Other | Source: Ambulatory Visit | Attending: Radiation Oncology | Admitting: Radiation Oncology

## 2023-01-03 DIAGNOSIS — N390 Urinary tract infection, site not specified: Secondary | ICD-10-CM | POA: Insufficient documentation

## 2023-01-03 LAB — URINALYSIS, COMPLETE (UACMP) WITH MICROSCOPIC
Bilirubin Urine: NEGATIVE
Glucose, UA: NEGATIVE mg/dL
Ketones, ur: NEGATIVE mg/dL
Nitrite: POSITIVE — AB
Protein, ur: 30 mg/dL — AB
Specific Gravity, Urine: 1.028 (ref 1.005–1.030)
pH: 5 (ref 5.0–8.0)

## 2023-01-05 LAB — URINE CULTURE: Culture: >=100000 — AB

## 2023-01-06 ENCOUNTER — Encounter: Payer: Self-pay | Admitting: Radiation Oncology

## 2023-01-06 ENCOUNTER — Other Ambulatory Visit: Payer: Self-pay

## 2023-01-06 ENCOUNTER — Telehealth: Payer: Self-pay | Admitting: Hematology and Oncology

## 2023-01-06 ENCOUNTER — Ambulatory Visit (HOSPITAL_BASED_OUTPATIENT_CLINIC_OR_DEPARTMENT_OTHER): Payer: Medicaid Other | Attending: Nurse Practitioner | Admitting: Internal Medicine

## 2023-01-06 VITALS — Ht 68.0 in | Wt 250.0 lb

## 2023-01-06 DIAGNOSIS — R0683 Snoring: Secondary | ICD-10-CM | POA: Diagnosis present

## 2023-01-06 DIAGNOSIS — N390 Urinary tract infection, site not specified: Secondary | ICD-10-CM

## 2023-01-06 DIAGNOSIS — G4761 Periodic limb movement disorder: Secondary | ICD-10-CM | POA: Insufficient documentation

## 2023-01-06 NOTE — Telephone Encounter (Signed)
Called placed to make patient aware of Dr. Clabe Seal orders for IV Rocephin 1 gm every 24 hours x 5 days for recurrent UTI. Patient's infusion time and transportation has been arranged. Patient voiced understanding.

## 2023-01-06 NOTE — Telephone Encounter (Signed)
Scheduled appointments per 3/18 secure chat (per [place]). Left voicemail.

## 2023-01-07 ENCOUNTER — Inpatient Hospital Stay: Payer: Medicaid Other

## 2023-01-07 ENCOUNTER — Other Ambulatory Visit: Payer: Self-pay

## 2023-01-07 DIAGNOSIS — N390 Urinary tract infection, site not specified: Secondary | ICD-10-CM | POA: Diagnosis not present

## 2023-01-07 MED ORDER — DEXTROSE 5 % IV SOLN
1.0000 g | INTRAVENOUS | Status: AC
  Administered 2023-01-07: 1 g via INTRAVENOUS
  Filled 2023-01-07: qty 10

## 2023-01-07 MED ORDER — DEXTROSE 5 % IV SOLN: INTRAVENOUS | Status: DC

## 2023-01-07 NOTE — Patient Instructions (Signed)
Ceftriaxone Injection What is this medication? CEFTRIAXONE (sef try AX one) treats infections caused by bacteria. It belongs to a group of medications called cephalosporin antibiotics. It will not treat colds, the flu, or infections caused by viruses. This medicine may be used for other purposes; ask your health care provider or pharmacist if you have questions. COMMON BRAND NAME(S): Ceftrisol Plus, Rocephin What should I tell my care team before I take this medication? They need to know if you have any of these conditions: Bleeding disorder High bilirubin level in newborn patients Kidney disease Liver disease Poor nutrition An unusual or allergic reaction to ceftriaxone, other penicillin or cephalosporin antibiotics, other medications, foods, dyes, or preservatives Pregnant or trying to get pregnant Breast-feeding How should I use this medication? This medication is injected into a vein or a muscle. It is usually given by your care team in a hospital or clinic setting. It may also be given at home. If you get this medication at home, you will be taught how to prepare and give it. Use exactly as directed. Take it as directed on the prescription label at the same time every day. Keep taking it even if you think you are better. It is important that you put your used needles and syringes in a special sharps container. Do not put them in a trash can. If you do not have a sharps container, call your pharmacist or care team to get one. Talk to your care team about the use of this medication in children. While it may be prescribed for children as young as newborns for selected conditions, precautions do apply. Overdosage: If you think you have taken too much of this medicine contact a poison control center or emergency room at once. NOTE: This medicine is only for you. Do not share this medicine with others. What if I miss a dose? If you get this medication at the hospital or clinic: It is important  not to miss your dose. Call your care team if you are unable to keep an appointment. If you give yourself this medication at home: If you miss a dose, take it as soon as you can. Then continue your normal schedule. If it is almost time for your next dose, take only that dose. Do not take double or extra doses. Call your care team with questions. What may interact with this medication? Estrogen or progestin hormones Intravenous calcium This list may not describe all possible interactions. Give your health care provider a list of all the medicines, herbs, non-prescription drugs, or dietary supplements you use. Also tell them if you smoke, drink alcohol, or use illegal drugs. Some items may interact with your medicine. What should I watch for while using this medication? Tell your care team if your symptoms do not start to get better or if they get worse. Do not treat diarrhea with over the counter products. Contact your care team if you have diarrhea that lasts more than 2 days or if it is severe and watery. If you have diabetes, you may get a false-positive result for sugar in your urine. Check with your care team. If you are being treated for a sexually transmitted infection (STI), avoid sexual contact until you have finished your treatment. Your partner may also need treatment. What side effects may I notice from receiving this medication? Side effects that you should report to your care team as soon as possible: Allergic reactions--skin rash, itching, hives, swelling of the face, lips, tongue, or throat   Hemolytic anemia--unusual weakness or fatigue, dizziness, headache, trouble breathing, dark urine, yellowing skin or eyes Severe diarrhea, fever Unusual vaginal discharge, itching, or odor Side effects that usually do not require medical attention (report to your care team if they continue or are bothersome): Diarrhea Headache Nausea Pain, redness, or irritation at injection site This list may  not describe all possible side effects. Call your doctor for medical advice about side effects. You may report side effects to FDA at 1-800-FDA-1088. Where should I keep my medication? Keep out of the reach of children and pets. You will be instructed on how to store this medication. Get rid of any unused medication after the expiration date. To get rid of medications that are no longer needed or have expired: Take the medication to a medication take-back program. Check with your pharmacy or law enforcement to find a location. If you cannot return the medication, ask your pharmacist or care team how to get rid of this medication safely. NOTE: This sheet is a summary. It may not cover all possible information. If you have questions about this medicine, talk to your doctor, pharmacist, or health care provider.  2023 Elsevier/Gold Standard (2021-12-17 00:00:00)  

## 2023-01-08 ENCOUNTER — Inpatient Hospital Stay: Payer: Medicaid Other

## 2023-01-08 ENCOUNTER — Telehealth: Payer: Self-pay | Admitting: Hematology and Oncology

## 2023-01-08 DIAGNOSIS — N390 Urinary tract infection, site not specified: Secondary | ICD-10-CM | POA: Diagnosis not present

## 2023-01-08 MED ORDER — DEXTROSE 5 % IV SOLN
1.0000 g | INTRAVENOUS | Status: AC
  Administered 2023-01-08: 1 g via INTRAVENOUS
  Filled 2023-01-08: qty 10

## 2023-01-08 MED ORDER — SODIUM CHLORIDE 0.9 % IV SOLN: Freq: Once | INTRAVENOUS | Status: AC

## 2023-01-08 NOTE — Patient Instructions (Signed)
Ceftriaxone Injection What is this medication? CEFTRIAXONE (sef try AX one) treats infections caused by bacteria. It belongs to a group of medications called cephalosporin antibiotics. It will not treat colds, the flu, or infections caused by viruses. This medicine may be used for other purposes; ask your health care provider or pharmacist if you have questions. COMMON BRAND NAME(S): Ceftrisol Plus, Rocephin What should I tell my care team before I take this medication? They need to know if you have any of these conditions: Bleeding disorder High bilirubin level in newborn patients Kidney disease Liver disease Poor nutrition An unusual or allergic reaction to ceftriaxone, other penicillin or cephalosporin antibiotics, other medications, foods, dyes, or preservatives Pregnant or trying to get pregnant Breast-feeding How should I use this medication? This medication is injected into a vein or a muscle. It is usually given by your care team in a hospital or clinic setting. It may also be given at home. If you get this medication at home, you will be taught how to prepare and give it. Use exactly as directed. Take it as directed on the prescription label at the same time every day. Keep taking it even if you think you are better. It is important that you put your used needles and syringes in a special sharps container. Do not put them in a trash can. If you do not have a sharps container, call your pharmacist or care team to get one. Talk to your care team about the use of this medication in children. While it may be prescribed for children as young as newborns for selected conditions, precautions do apply. Overdosage: If you think you have taken too much of this medicine contact a poison control center or emergency room at once. NOTE: This medicine is only for you. Do not share this medicine with others. What if I miss a dose? If you get this medication at the hospital or clinic: It is important  not to miss your dose. Call your care team if you are unable to keep an appointment. If you give yourself this medication at home: If you miss a dose, take it as soon as you can. Then continue your normal schedule. If it is almost time for your next dose, take only that dose. Do not take double or extra doses. Call your care team with questions. What may interact with this medication? Estrogen or progestin hormones Intravenous calcium This list may not describe all possible interactions. Give your health care provider a list of all the medicines, herbs, non-prescription drugs, or dietary supplements you use. Also tell them if you smoke, drink alcohol, or use illegal drugs. Some items may interact with your medicine. What should I watch for while using this medication? Tell your care team if your symptoms do not start to get better or if they get worse. Do not treat diarrhea with over the counter products. Contact your care team if you have diarrhea that lasts more than 2 days or if it is severe and watery. If you have diabetes, you may get a false-positive result for sugar in your urine. Check with your care team. If you are being treated for a sexually transmitted infection (STI), avoid sexual contact until you have finished your treatment. Your partner may also need treatment. What side effects may I notice from receiving this medication? Side effects that you should report to your care team as soon as possible: Allergic reactions--skin rash, itching, hives, swelling of the face, lips, tongue, or throat   Hemolytic anemia--unusual weakness or fatigue, dizziness, headache, trouble breathing, dark urine, yellowing skin or eyes Severe diarrhea, fever Unusual vaginal discharge, itching, or odor Side effects that usually do not require medical attention (report to your care team if they continue or are bothersome): Diarrhea Headache Nausea Pain, redness, or irritation at injection site This list may  not describe all possible side effects. Call your doctor for medical advice about side effects. You may report side effects to FDA at 1-800-FDA-1088. Where should I keep my medication? Keep out of the reach of children and pets. You will be instructed on how to store this medication. Get rid of any unused medication after the expiration date. To get rid of medications that are no longer needed or have expired: Take the medication to a medication take-back program. Check with your pharmacy or law enforcement to find a location. If you cannot return the medication, ask your pharmacist or care team how to get rid of this medication safely. NOTE: This sheet is a summary. It may not cover all possible information. If you have questions about this medicine, talk to your doctor, pharmacist, or health care provider.  2023 Elsevier/Gold Standard (2021-12-17 00:00:00)  

## 2023-01-08 NOTE — Telephone Encounter (Signed)
Patient called to reschedule 3/22 appointment to later time. Per charge appointment was moved to later time. Called patient back and left voicemail with new appointment time. Forwarded information to transportation liaison to confirm changes.

## 2023-01-09 ENCOUNTER — Other Ambulatory Visit: Payer: Self-pay

## 2023-01-09 ENCOUNTER — Inpatient Hospital Stay: Payer: Medicaid Other

## 2023-01-09 ENCOUNTER — Ambulatory Visit: Payer: Medicaid Other

## 2023-01-09 DIAGNOSIS — N390 Urinary tract infection, site not specified: Secondary | ICD-10-CM | POA: Diagnosis not present

## 2023-01-09 MED ORDER — DEXTROSE 5 % IV SOLN
1.0000 g | Freq: Once | INTRAVENOUS | Status: AC
  Administered 2023-01-09: 1 g via INTRAVENOUS
  Filled 2023-01-09: qty 10

## 2023-01-09 MED ORDER — SODIUM CHLORIDE 0.9 % IV SOLN: INTRAVENOUS | Status: DC

## 2023-01-10 ENCOUNTER — Inpatient Hospital Stay: Payer: Medicaid Other

## 2023-01-10 VITALS — BP 131/70 | HR 81 | Temp 98.2°F | Resp 16

## 2023-01-10 DIAGNOSIS — N390 Urinary tract infection, site not specified: Secondary | ICD-10-CM

## 2023-01-10 MED ORDER — SODIUM CHLORIDE 0.9 % IV SOLN: Freq: Once | INTRAVENOUS | Status: AC

## 2023-01-10 MED ORDER — DEXTROSE 5 % IV SOLN
1.0000 g | INTRAVENOUS | Status: AC
  Administered 2023-01-10: 1 g via INTRAVENOUS
  Filled 2023-01-10: qty 10

## 2023-01-10 MED FILL — Ceftriaxone Sodium For Inj 2 GM: INTRAMUSCULAR | Qty: 10 | Status: AC

## 2023-01-10 NOTE — Patient Instructions (Signed)
Ceftriaxone Injection What is this medication? CEFTRIAXONE (sef try AX one) treats infections caused by bacteria. It belongs to a group of medications called cephalosporin antibiotics. It will not treat colds, the flu, or infections caused by viruses. This medicine may be used for other purposes; ask your health care provider or pharmacist if you have questions. COMMON BRAND NAME(S): Ceftrisol Plus, Rocephin What should I tell my care team before I take this medication? They need to know if you have any of these conditions: Bleeding disorder High bilirubin level in newborn patients Kidney disease Liver disease Poor nutrition An unusual or allergic reaction to ceftriaxone, other penicillin or cephalosporin antibiotics, other medications, foods, dyes, or preservatives Pregnant or trying to get pregnant Breast-feeding How should I use this medication? This medication is injected into a vein or a muscle. It is usually given by your care team in a hospital or clinic setting. It may also be given at home. If you get this medication at home, you will be taught how to prepare and give it. Use exactly as directed. Take it as directed on the prescription label at the same time every day. Keep taking it even if you think you are better. It is important that you put your used needles and syringes in a special sharps container. Do not put them in a trash can. If you do not have a sharps container, call your pharmacist or care team to get one. Talk to your care team about the use of this medication in children. While it may be prescribed for children as young as newborns for selected conditions, precautions do apply. Overdosage: If you think you have taken too much of this medicine contact a poison control center or emergency room at once. NOTE: This medicine is only for you. Do not share this medicine with others. What if I miss a dose? If you get this medication at the hospital or clinic: It is important  not to miss your dose. Call your care team if you are unable to keep an appointment. If you give yourself this medication at home: If you miss a dose, take it as soon as you can. Then continue your normal schedule. If it is almost time for your next dose, take only that dose. Do not take double or extra doses. Call your care team with questions. What may interact with this medication? Estrogen or progestin hormones Intravenous calcium This list may not describe all possible interactions. Give your health care provider a list of all the medicines, herbs, non-prescription drugs, or dietary supplements you use. Also tell them if you smoke, drink alcohol, or use illegal drugs. Some items may interact with your medicine. What should I watch for while using this medication? Tell your care team if your symptoms do not start to get better or if they get worse. Do not treat diarrhea with over the counter products. Contact your care team if you have diarrhea that lasts more than 2 days or if it is severe and watery. If you have diabetes, you may get a false-positive result for sugar in your urine. Check with your care team. If you are being treated for a sexually transmitted infection (STI), avoid sexual contact until you have finished your treatment. Your partner may also need treatment. What side effects may I notice from receiving this medication? Side effects that you should report to your care team as soon as possible: Allergic reactions--skin rash, itching, hives, swelling of the face, lips, tongue, or throat   Hemolytic anemia--unusual weakness or fatigue, dizziness, headache, trouble breathing, dark urine, yellowing skin or eyes Severe diarrhea, fever Unusual vaginal discharge, itching, or odor Side effects that usually do not require medical attention (report to your care team if they continue or are bothersome): Diarrhea Headache Nausea Pain, redness, or irritation at injection site This list may  not describe all possible side effects. Call your doctor for medical advice about side effects. You may report side effects to FDA at 1-800-FDA-1088. Where should I keep my medication? Keep out of the reach of children and pets. You will be instructed on how to store this medication. Get rid of any unused medication after the expiration date. To get rid of medications that are no longer needed or have expired: Take the medication to a medication take-back program. Check with your pharmacy or law enforcement to find a location. If you cannot return the medication, ask your pharmacist or care team how to get rid of this medication safely. NOTE: This sheet is a summary. It may not cover all possible information. If you have questions about this medicine, talk to your doctor, pharmacist, or health care provider.  2023 Elsevier/Gold Standard (2021-12-17 00:00:00)  

## 2023-01-11 ENCOUNTER — Inpatient Hospital Stay: Payer: Medicaid Other

## 2023-01-11 DIAGNOSIS — N390 Urinary tract infection, site not specified: Secondary | ICD-10-CM

## 2023-01-11 MED ORDER — SODIUM CHLORIDE 0.9 % IV SOLN: INTRAVENOUS | Status: DC

## 2023-01-11 MED ORDER — DEXTROSE 5 % IV SOLN
1.0000 g | INTRAVENOUS | Status: AC
  Administered 2023-01-11: 1 g via INTRAVENOUS
  Filled 2023-01-11: qty 10

## 2023-01-11 NOTE — Patient Instructions (Signed)
Ceftriaxone Injection What is this medication? CEFTRIAXONE (sef try AX one) treats infections caused by bacteria. It belongs to a group of medications called cephalosporin antibiotics. It will not treat colds, the flu, or infections caused by viruses. This medicine may be used for other purposes; ask your health care provider or pharmacist if you have questions. COMMON BRAND NAME(S): Ceftrisol Plus, Rocephin What should I tell my care team before I take this medication? They need to know if you have any of these conditions: Bleeding disorder High bilirubin level in newborn patients Kidney disease Liver disease Poor nutrition An unusual or allergic reaction to ceftriaxone, other penicillin or cephalosporin antibiotics, other medications, foods, dyes, or preservatives Pregnant or trying to get pregnant Breast-feeding How should I use this medication? This medication is injected into a vein or a muscle. It is usually given by your care team in a hospital or clinic setting. It may also be given at home. If you get this medication at home, you will be taught how to prepare and give it. Use exactly as directed. Take it as directed on the prescription label at the same time every day. Keep taking it even if you think you are better. It is important that you put your used needles and syringes in a special sharps container. Do not put them in a trash can. If you do not have a sharps container, call your pharmacist or care team to get one. Talk to your care team about the use of this medication in children. While it may be prescribed for children as young as newborns for selected conditions, precautions do apply. Overdosage: If you think you have taken too much of this medicine contact a poison control center or emergency room at once. NOTE: This medicine is only for you. Do not share this medicine with others. What if I miss a dose? If you get this medication at the hospital or clinic: It is important  not to miss your dose. Call your care team if you are unable to keep an appointment. If you give yourself this medication at home: If you miss a dose, take it as soon as you can. Then continue your normal schedule. If it is almost time for your next dose, take only that dose. Do not take double or extra doses. Call your care team with questions. What may interact with this medication? Estrogen or progestin hormones Intravenous calcium This list may not describe all possible interactions. Give your health care provider a list of all the medicines, herbs, non-prescription drugs, or dietary supplements you use. Also tell them if you smoke, drink alcohol, or use illegal drugs. Some items may interact with your medicine. What should I watch for while using this medication? Tell your care team if your symptoms do not start to get better or if they get worse. Do not treat diarrhea with over the counter products. Contact your care team if you have diarrhea that lasts more than 2 days or if it is severe and watery. If you have diabetes, you may get a false-positive result for sugar in your urine. Check with your care team. If you are being treated for a sexually transmitted infection (STI), avoid sexual contact until you have finished your treatment. Your partner may also need treatment. What side effects may I notice from receiving this medication? Side effects that you should report to your care team as soon as possible: Allergic reactions--skin rash, itching, hives, swelling of the face, lips, tongue, or throat   Hemolytic anemia--unusual weakness or fatigue, dizziness, headache, trouble breathing, dark urine, yellowing skin or eyes Severe diarrhea, fever Unusual vaginal discharge, itching, or odor Side effects that usually do not require medical attention (report to your care team if they continue or are bothersome): Diarrhea Headache Nausea Pain, redness, or irritation at injection site This list may  not describe all possible side effects. Call your doctor for medical advice about side effects. You may report side effects to FDA at 1-800-FDA-1088. Where should I keep my medication? Keep out of the reach of children and pets. You will be instructed on how to store this medication. Get rid of any unused medication after the expiration date. To get rid of medications that are no longer needed or have expired: Take the medication to a medication take-back program. Check with your pharmacy or law enforcement to find a location. If you cannot return the medication, ask your pharmacist or care team how to get rid of this medication safely. NOTE: This sheet is a summary. It may not cover all possible information. If you have questions about this medicine, talk to your doctor, pharmacist, or health care provider.  2023 Elsevier/Gold Standard (2021-12-17 00:00:00)  

## 2023-01-13 ENCOUNTER — Encounter: Payer: Self-pay | Admitting: General Practice

## 2023-01-13 NOTE — Progress Notes (Signed)
[facility] Spiritual Care Note  Received call from Jordan [name] to process her longstanding UTI, as well as grief and family relationships.  Provided reflective listening and emotional support.  [name] calls frequently as needed.   [place], [place], [facility] Pager [phone] Voicemail [phone]

## 2023-01-16 ENCOUNTER — Encounter: Payer: Self-pay | Admitting: Internal Medicine

## 2023-01-16 ENCOUNTER — Ambulatory Visit (INDEPENDENT_AMBULATORY_CARE_PROVIDER_SITE_OTHER): Payer: Medicaid Other | Admitting: Internal Medicine

## 2023-01-16 ENCOUNTER — Other Ambulatory Visit: Payer: Self-pay

## 2023-01-16 VITALS — BP 108/73 | HR 81 | Temp 97.5°F | Ht 68.0 in | Wt 245.0 lb

## 2023-01-16 DIAGNOSIS — R0683 Snoring: Secondary | ICD-10-CM | POA: Diagnosis not present

## 2023-01-16 DIAGNOSIS — N3090 Cystitis, unspecified without hematuria: Secondary | ICD-10-CM | POA: Insufficient documentation

## 2023-01-16 NOTE — Progress Notes (Signed)
Jordan [name] for Infectious Disease      Reason for Consult: recurrent cystitis    Referring Physician: Terrance Mass FNP    Patient ID: Jordan [name], female    DOB: [birth year], 57 y.o.   MRN: [id number]  HPI:   Jordan [name] is here with concerns for recurrent UTI. She reports 17 years of symptoms of dysuria, odor and vaginal pain.  She has a history of squamous cell carcinoma of the vagina treated with radiation in 2017 and followed by oncology.  She feels these symptoms represent infection and has been on serial courses of antibiotics.  Urine cultures with multiple bacteria species.  She has no relief of symptoms with antiboitcs at any time and no relief with any intervention.  She has recently been on ceftriaxone and no change in symptoms.    Past Medical History:  Diagnosis Date   Alcohol abuse    Anemia    patient denies   Bipolar 1 disorder ([place])    No medications currently   CAP (community acquired pneumonia) 03/17/2015   History of radiation therapy 08/24/19-09/22/19   Vaginal brachytherapy   Dr. 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 Authorizing Provider  cyanocobalamin (VITAMIN B12) 1000 MCG/ML injection Inject 1 mL (1,000 mcg total) into the muscle every 7 (seven) days. Patient not taking: Reported on 11/20/2022 08/22/22   Barb Merino, MD  diazepam (VALIUM) 5 MG tablet Take 1 tablet (5 mg total) by mouth every 12 (twelve) hours as needed (dizziness). 11/21/22   Isla Pence, MD  gabapentin (NEURONTIN) 800 MG tablet Take 800 mg by mouth 3 (three) times daily. 01/11/22   [provider]  meclizine (ANTIVERT) 25 MG  tablet Take 1 tablet (25 mg total) by mouth 3 (three) times daily as needed for dizziness. Patient not taking: Reported on 11/20/2022 10/11/22   Wilnette Kales, PA  meclizine (ANTIVERT) 25 MG tablet Take 1 tablet (25 mg total) by mouth 3 (three) times daily as needed for dizziness. Patient not taking: Reported on 12/12/2022 11/21/22   Isla Pence, MD  nitrofurantoin, macrocrystal-monohydrate, (MACROBID) 100 MG capsule Take 1 capsule (100 mg total) by mouth at bedtime. Start taking after Keflex prescription is completed. Patient not taking: Reported on 11/20/2022 11/06/22   Chancy Milroy, MD  ondansetron (ZOFRAN-ODT) 4 MG disintegrating tablet Take 1 tablet (4 mg total) by mouth every 8 (eight) hours as needed. Patient not taking: Reported on 12/12/2022 11/21/22   Isla Pence, MD  oxybutynin (DITROPAN XL) 15 MG 24 hr tablet Take 15 mg by mouth 2 (two) times daily. Patient not taking: Reported on 12/12/2022 10/17/22   [provider]  Oxycodone HCl 10 MG TABS Take 10 mg by mouth daily as needed. 01/27/20   [provider]    Allergies  Allergen Reactions   Ciprofloxacin Swelling    Lips swell, tongue swells, face swells   Citalopram Other (See Comments)    Possible cause of pancytopenia. Swelling of tongue, face and throat   Lamotrigine Other (See Comments)    Possible cause of pancytopenia. Swelling  of face, throat and tongue   Sulfa Antibiotics Anaphylaxis   Tramadol Anaphylaxis, Shortness Of Breath and Swelling    Social History   Tobacco Use   Smoking status: Never   Smokeless tobacco: Never  Vaping Use   Vaping Use: Never used  Substance Use Topics   Alcohol use: Not Currently   Drug use: No    Family History  Problem Relation Age of Onset   Alcohol abuse Father    Cancer Father    Breast cancer Neg Hx     Review of Systems  Constitutional: negative for fevers and chills All other systems reviewed and are negative    Constitutional: in no  apparent distress There were no vitals filed for this visit. EYES: anicteric Respiratory: normal respiratory effort Musculoskeletal: no edema  Labs: Lab Results  Component Value Date   WBC 4.1 11/21/2022   HGB 15.6 (H) 11/21/2022   HCT 46.5 (H) 11/21/2022   MCV 91.2 11/21/2022   PLT 143 (L) 11/21/2022    Lab Results  Component Value Date   CREATININE 0.55 11/21/2022   BUN 9 11/21/2022   NA 142 11/21/2022   K 3.5 11/21/2022   CL 109 11/21/2022   CO2 26 11/21/2022    Lab Results  Component Value Date   ALT 14 11/21/2022   AST 17 11/21/2022   ALKPHOS 93 11/21/2022   BILITOT 0.8 11/21/2022   INR 1.04 02/18/2015     Assessment: cystitis with no relief of symptoms with current interventions. I discussed with her that at this point it clearly is not an infection and antibiotics are not indicated for her.  I discussed colonization of bacteria.  No role for suppressive antibiotics.  I recommend urogynecology evaluation.    Plan: 1)  referral for urogynecology No antibiotics indicated Follow up here as needed.

## 2023-01-16 NOTE — Procedures (Signed)
       Patient Name: Jordan [name], Sill Date: 01/06/2023 Gender: Female D.O.B: 1966-05-24 Age (years): 76 Referring Provider: [name] NP Height (inches): 68 Interpreting Physician: Baird Lyons MD, ABSM Weight (lbs): 250 RPSGT: Carolin Coy BMI: 38 MRN: [id number] Neck Size: 14.50  CLINICAL INFORMATION Sleep Study Type: NPSG Indication for sleep study: Daytime Fatigue, Fatigue, Obesity, Snoring Epworth Sleepiness Score: 11  SLEEP STUDY TECHNIQUE As per the AASM Manual for the Scoring of Sleep and Associated Events v2.3 (April 2016) with a hypopnea requiring 4% desaturations.  The channels recorded and monitored were frontal, central and occipital EEG, electrooculogram (EOG), submentalis EMG (chin), nasal and oral airflow, thoracic and abdominal wall motion, anterior tibialis EMG, snore microphone, electrocardiogram, and pulse oximetry.  MEDICATIONS Medications self-administered by patient taken the night of the study : NAPROXEN  SLEEP ARCHITECTURE The study was initiated at 10:29:31 PM and ended at 4:54:16 AM.  Sleep onset time was 56.2 minutes and the sleep efficiency was 50.6%. The total sleep time was 194.5 minutes.  Stage REM latency was 142.5 minutes.  The patient spent 5.7% of the night in stage N1 sleep, 74.8% in stage N2 sleep, 12.3% in stage N3 and 7.2% in REM.  Alpha intrusion was absent.  Supine sleep was 100.00%.  RESPIRATORY PARAMETERS The overall apnea/hypopnea index (AHI) was 4.3 per hour. There were 0 total apneas, including 0 obstructive, 0 central and 0 mixed apneas. There were 14 hypopneas and 9 RERAs.  The AHI during Stage REM sleep was 30.0 per hour.  AHI while supine was 4.3 per hour.  The mean oxygen saturation was 92.9%. The minimum SpO2 during sleep was 87.0%.  soft snoring was noted during this study.  CARDIAC DATA The 2 lead EKG demonstrated sinus rhythm. The mean heart rate was 73.6 beats per minute. Other EKG findings  include: None.  LEG MOVEMENT DATA The total PLMS were 0 with a resulting PLMS index of 0.0. Associated arousal with leg movement index was 36.4 .  IMPRESSIONS - No significant obstructive sleep apnea occurred during this study (AHI = 4.3/h). - Mild oxygen desaturation was noted during this study (Min O2 = 87.0%, Mean 92.9%). - The patient snored with soft snoring volume. - Occasional PACs. - Limb movement total 333 (102.7/ hr). Limb movement with arousal/ awakening total 118 ( 36.4/ hr).  DIAGNOSIS - Periodic Limb Movement with Arousal   RECOMMENDATIONS - Consider trial of Mirapex, Requip, or Sinemet for treatment of Periodic Leg Movements of Sleep. - Be careful with alcohol, sedatives and other CNS depressants that may worsen sleep apnea and disrupt normal sleep architecture. - Sleep hygiene should be reviewed to assess factors that may improve sleep quality. - Weight management and regular exercise should be initiated or continued if appropriate.  [Electronically signed] 01/16/2023 01:46 PM  Baird Lyons MD, [place], American Board of Sleep Medicine NPI: [id number]                      [place], [place] [facility]  ELECTRONICALLY SIGNED ON:  01/16/2023, 1:42 PM [place] PH: [phone]   FX: [phone] [place]

## 2023-01-17 ENCOUNTER — Encounter: Payer: Self-pay | Admitting: General Practice

## 2023-01-17 NOTE — Progress Notes (Signed)
[place] Spiritual Care Note  Returned voicemail from [place] with note about availability for follow-up phone call next week.   [place], [place], [facility] Pager [phone] Voicemail [phone]

## 2023-01-20 ENCOUNTER — Encounter: Payer: Self-pay | Admitting: Radiation Oncology

## 2023-01-21 ENCOUNTER — Encounter: Payer: Self-pay | Admitting: Radiation Oncology

## 2023-01-21 ENCOUNTER — Encounter: Payer: Self-pay | Admitting: General Practice

## 2023-01-21 NOTE — Progress Notes (Signed)
[place] Spiritual Care Note  Returned call from Leinani so that she could process disappointment with lack of resolution from a recent medical appointment. She also used the opportunity to process her ongoing grief after the death of her partner Merry Proud.  Provided empathic listening and emotional support. Shareeta reaches out regularly as needed.   [place], [place], [facility] Pager [phone] Voicemail [phone]

## 2023-01-23 ENCOUNTER — Ambulatory Visit: Payer: Medicaid Other | Admitting: Clinical

## 2023-01-23 DIAGNOSIS — F4321 Adjustment disorder with depressed mood: Secondary | ICD-10-CM

## 2023-01-23 NOTE — BH Specialist Note (Signed)
Integrated Behavioral Health via Telemedicine Visit  01/23/2023 Jordan [name] [id number]  I reviewed patient visit with the [facility] [name], and I concur with the treatment plan, as documented in the [facility] [name] note.   No charge for this visit due to [facility] [name] seeing patient.  Jordan [name], MSW, [place] for [facility] Healthcare at [facility]   Number of [place] Clinician visits: 4- Fourth Visit  Session Start time: 0115   Session End time: 0220  Total time in minutes: 65   Referring Provider: Arlina Robes, MD Jordan [name], Jordan [name]) [facility] Provider location: [facility]  All persons participating in visit: Jordan [name] and Patient  Types of Service: Individual psychotherapy  I connected with Jordan [name] and/or Jordan [name]  Telephone or Video Enabled Telemedicine Application  (Video is Caregility application) and verified that I am speaking with the correct person using two identifiers. Discussed confidentiality: Yes   I discussed the limitations of telemedicine and the availability of in person appointments.  Discussed there is a possibility of technology failure and discussed alternative modes of communication if that failure occurs.  I discussed that engaging in this telemedicine visit, they consent to the provision of behavioral healthcare and the services will be billed under their insurance.  Patient and/or legal guardian expressed understanding and consented to Telemedicine visit: Yes   Presenting Concerns: Patient and/or family reports the following symptoms/concerns: Grief due to loss of long-term partner  Duration of problem: Ongoing ; Severity of problem: moderate  Patient and/or Family's Strengths/Protective Factors: Social connections, Concrete supports in place (healthy food, safe environments, etc.), and Sense of purpose  Goals  Addressed: Patient will:  Reduce symptoms of: depression and stress   Increase knowledge and/or ability of: coping skills, healthy habits, self-management skills, and stress reduction   Demonstrate ability to: Increase healthy adjustment to current life circumstances, Increase adequate support systems for Jordan, and Improve medication compliance  Progress towards Goals: Ongoing  Interventions: Interventions utilized:  Solution-Focused Strategies, Supportive Counseling, Psychoeducation and/or Health Education, and Supportive Reflection Standardized Assessments completed: Not Needed  Patient and/or Family Response: Pt agrees with treatment plan   Assessment: Patient currently experiencing challenges due to long-term UTI and chronic pain as well as grief from loss of her partner.   Patient may benefit from managing medications by using a pill box, tracking when she takes her pills, and receiving long-term outpatient treatment for grief. .  Plan: Follow up with behavioral health clinician on : 4/18 at 1:15 Pm  Behavioral recommendations: -Continue prioritizing healthy self-care (regular meals, adequate rest; allowing practical help from supportive friends and family)  - Consider Grief Support group  -Consider Outpatient therapy  -Call daughter to increase social supports  -Medication tracking for UTI medicine  Referral(s): [place] (In Clinic)  I discussed the assessment and treatment plan with the patient and/or parent/guardian. They were provided an opportunity to ask questions and all were answered. They agreed with the plan and demonstrated an understanding of the instructions.   They were advised to call back or seek an in-person evaluation if the symptoms worsen or if the condition fails to improve as anticipated.  Alba Destine

## 2023-01-24 ENCOUNTER — Telehealth: Payer: Self-pay

## 2023-01-24 NOTE — Telephone Encounter (Signed)
Patient called complaining of dysuria, malodorous vaginal smell, and desire to to be seen by Dr. Roselind Messier. Informed patient that Dr. Roselind Messier was out of the office today, but I would make note of her request for him to address when he returns Monday. Patient verbalized understanding and agreement.

## 2023-01-27 ENCOUNTER — Encounter: Payer: Self-pay | Admitting: Obstetrics and Gynecology

## 2023-01-27 ENCOUNTER — Ambulatory Visit (INDEPENDENT_AMBULATORY_CARE_PROVIDER_SITE_OTHER): Payer: Medicaid Other | Admitting: Obstetrics and Gynecology

## 2023-01-27 VITALS — BP 123/67 | HR 93 | Wt 245.0 lb

## 2023-01-27 DIAGNOSIS — N951 Menopausal and female climacteric states: Secondary | ICD-10-CM

## 2023-01-27 MED ORDER — ESTRADIOL 0.075 MG/24HR TD PTTW: 1.0000 | MEDICATED_PATCH | TRANSDERMAL | 12 refills | Status: DC

## 2023-01-27 NOTE — Progress Notes (Signed)
[name] presents for f/u of her menopausal Sx. She wants to try medication now  Still having chronic UTI problems, followed by Urology. They referred her to ID Saw ID which has referred her to Urogyn  PE AF VSS Lungs clear  Heart RRR Abd soft + BS    A/P Menopausal Sx        Chronic UTI  Will start Vivelle dot 0.75 mg. U/R/B reviewed with pt Will check UC per pt request F/U in 3 months. Encouraged pt to keep UroGyn appt

## 2023-01-30 ENCOUNTER — Telehealth: Payer: Self-pay | Admitting: Family Medicine

## 2023-01-30 LAB — CULTURE, OB URINE

## 2023-01-30 LAB — URINE CULTURE, OB REFLEX

## 2023-01-30 NOTE — Telephone Encounter (Signed)
I called Jordan [name] back and explained I am returning her call. She states she is wanting to know urine culture results. I explained the urine was sent out for culture but it can take up to 72 hours to get final results and we don't have results yet. I explained we should get results by sometime tomorrow and once provider reviews results and if positive provider may send her a MyChart message or have [name] call  her - and that may be tomorrow or it may not be until Monday as we close at 12 tomorrow. She states she has trouble with reading MyChart . I informed her if she doesn't hear anything and she wants to be sure, she can always call us back. She voices understanding. [name]

## 2023-01-30 NOTE — Telephone Encounter (Signed)
Patient would like a call back regarding test results. 

## 2023-02-03 ENCOUNTER — Telehealth: Payer: Self-pay | Admitting: *Deleted

## 2023-02-03 ENCOUNTER — Encounter: Payer: Self-pay | Admitting: *Deleted

## 2023-02-03 DIAGNOSIS — N390 Urinary tract infection, site not specified: Secondary | ICD-10-CM

## 2023-02-03 MED ORDER — CEPHALEXIN 500 MG PO CAPS: ORAL_CAPSULE | ORAL | 0 refills | Status: DC

## 2023-02-03 NOTE — Telephone Encounter (Signed)
Daleyza called back again and apologized she hung up earlier. She states she has been dealing with this for 17 years. I reviewed her instructions for taking the Keflex with [name] and explained she will take it for 10 days TID then every night until she sees the specialist( urogyn). She voices understanding. She also asked if I can explain how to take the Vivelle dots. I explained she applies a patch 2 times a week and each time takes off the old patch. I explained she can pick the days. She chose Monday and Thursday. She repeated back the instructions several times until she said she understood instructions.  [name]

## 2023-02-03 NOTE — Telephone Encounter (Signed)
-----   Message from Hermina Staggers, MD sent at 01/30/2023  5:17 PM EDT ----- Please let pt know that her urine culture was positive for E.Coli Send in Rx for Keflex 500 mg po tid x 10 days and then 1 tablet qhs until she sees [employer]

## 2023-02-03 NOTE — Telephone Encounter (Signed)
Patient also called asking for results this morning. I called Jordan [name] and informed her of results and treatment per Dr. Alysia Penna. She voiced she understood she needed to take the medicine and she said she hoped she could get the money to pay for the medicine . I was telling her she will be taking her the medicine instuctions and the call was ended.  I will send her a message about taking until she sees urogyn and about her medication. [name]

## 2023-02-03 NOTE — Telephone Encounter (Signed)
Jordan [name] called and left a message she needed to talk with nurse again. I called and left a message I am returning her call, please call office back if you still have a question. [name]

## 2023-02-06 ENCOUNTER — Ambulatory Visit: Payer: Medicaid Other | Admitting: Clinical

## 2023-02-06 DIAGNOSIS — F4321 Adjustment disorder with depressed mood: Secondary | ICD-10-CM

## 2023-02-06 NOTE — BH Specialist Note (Signed)
Integrated Behavioral Health Follow Up In-Person Visit  MRN: [id number] Name: Jordan [name]  Number of Integrated Behavioral Health Clinician visits: 5-Fifth Visit  Session Start time: 1039   Session End time: 1120  Total time in minutes: 41  I reviewed patient visit with the [facility] Intern, and I concur with the treatment plan, as documented in the [facility] Intern note.   No charge for this visit due to [facility] Intern seeing patient.  Hulda Marin, MSW, LCSW Integrated Behavioral Health Clinician Center for [facility] Healthcare at [facility] for [facility]   Types of Service: Individual psychotherapy  Interpretor:No. Subjective: Jordan [name] is a 57 y.o. female  Patient was referred by Jordan Elm, MD for Grief . Patient reports the following symptoms/concerns: Concerns regarding the death of long-term partner  Duration of problem: ongoing ; Severity of problem: moderate  Objective: Mood: Depressed and Affect: Appropriate and Tearful Risk of harm to self or others: No plan to harm self or others  Patient and/or Family's Strengths/Protective Factors: Concrete supports in place (healthy food, safe environments, etc.) and Sense of purpose  Goals Addressed: Patient will:  Reduce symptoms of: depression and stress   Increase knowledge and/or ability of: coping skills, healthy habits, self-management skills, and stress reduction   Demonstrate ability to: Increase healthy adjustment to current life circumstances, Increase adequate support systems for patient/family, and Begin healthy grieving over loss  Progress towards Goals: Ongoing  Interventions: Interventions utilized:  Solution-Focused Strategies, Supportive Counseling, and Psychoeducation and/or Health Education Standardized Assessments completed: Patient declined screening  Patient and/or Family Response: Pt agrees with treatment plan  Plan: Follow up with behavioral health clinician on : Pt will follow-up with Jordan [name]  on /  At 10:45 AM 2. Behavioral recommendations: -Continue prioritizing healthy self-care (regular meals, adequate rest; allowing practical help from supportive friends and family)  Referral(s): Integrated [employer] (In Clinic)  [place]

## 2023-02-10 ENCOUNTER — Telehealth: Payer: Self-pay | Admitting: Obstetrics and Gynecology

## 2023-02-10 DIAGNOSIS — M546 Pain in thoracic spine: Secondary | ICD-10-CM | POA: Insufficient documentation

## 2023-02-10 DIAGNOSIS — M5414 Radiculopathy, thoracic region: Secondary | ICD-10-CM | POA: Insufficient documentation

## 2023-02-10 DIAGNOSIS — F71 Moderate intellectual disabilities: Secondary | ICD-10-CM | POA: Insufficient documentation

## 2023-02-10 DIAGNOSIS — G8929 Other chronic pain: Secondary | ICD-10-CM | POA: Insufficient documentation

## 2023-02-10 DIAGNOSIS — F209 Schizophrenia, unspecified: Secondary | ICD-10-CM | POA: Insufficient documentation

## 2023-02-10 DIAGNOSIS — M5416 Radiculopathy, lumbar region: Secondary | ICD-10-CM | POA: Insufficient documentation

## 2023-02-10 NOTE — BH Specialist Note (Signed)
Integrated Behavioral Health Initial In-Person Visit  MRN: [id number] Name: Jordan [name]  Number of Integrated Behavioral Health Clinician visits: 1- Initial Visit  Session Start time: 1058    Session End time: 1157  Total time in minutes: 59   Types of Service: Individual psychotherapy  Interpretor:No. Interpretor Name and Language: n/a   Warm Hand Off Completed.        Subjective: Jordan [name] is a 57 y.o. female accompanied by  n/a Patient was referred by Nettie Elm, MD for positive depression screening Patient reports the following symptoms/concerns: Ongoing grief over loss of partner of 17 years, along with distress over UTI for 17 years; processing relationship with partner as well as family; food insecurity.  Duration of problem: Almost one year since loss; Severity of problem:  moderately severe  Objective: Mood: Anxious and Depressed and Affect: Tearful Risk of harm to self or others: No plan to harm self or others  Life Context: Family and Social: Pt lives by herself after partner passed away almost 1 year ago; not close with immediate or extended family School/Work: On disability Self-Care: Advocates for her own health care Life Changes: Ongoing health issues; grieving loss of partner of 17 years for almost one year  Patient and/or Family's Strengths/Protective Factors: Sense of purpose  Goals Addressed: Patient will: Reduce symptoms of: agitation, anxiety, depression, mood instability, and stress Increase knowledge and/or ability of: stress reduction  Demonstrate ability to: Increase healthy adjustment to current life circumstances and Increase motivation to adhere to plan of care  Progress towards Goals: Ongoing  Interventions: Interventions utilized: Motivational Interviewing and Supportive Reflection  Standardized Assessments completed: Not Needed  Patient and/or Family Response: Patient agrees with treatment plan.   Patient Centered  Plan: Patient is on the following Treatment Plan(s):  IBH  Assessment: Patient currently experiencing Bipolar affective disorder, mixed, unspecified; Grief   Patient may benefit from psychoeducation and brief therapeutic interventions regarding coping with symptoms of depression, anxiety, mood instability, life stress .  Plan: Follow up with behavioral health clinician on : Two weeks Behavioral recommendations:  -Continue attending psychiatry appointments; taking BH medication as prescribed by psychiatry -Visit [employer] today -Consider keeping communication open with daughter -Continue advocating for appropriate healthcare; attending all specialty appointments -Consider adding humor into daily life, as discussed Referral(s): Integrated [facility] [employer] (In Clinic) and Community Resources:  Food  [name] [place], [place]      07/18/2022    1:43 PM 04/17/2022   11:19 AM 08/20/2021    2:13 PM 05/24/2016   10:29 AM  Depression screen PHQ 2/9  Decreased Interest 1 0 0 0  Down, Depressed, Hopeless 1 1 0 3  PHQ - 2 Score 2 1 0 3  Altered sleeping 1 1 0 2  Tired, decreased energy 1 0 0 3  Change in appetite 1 0 0 3  Feeling bad or failure about yourself  0 2 0 3  Trouble concentrating  3 0 3  Moving slowly or fidgety/restless 1 0 0 2  Suicidal thoughts 0 0 0 3  PHQ-9 Score 6 7 0 22      07/18/2022    1:43 PM 04/17/2022   11:19 AM 08/20/2021    2:13 PM 05/24/2016   10:30 AM  GAD 7 : Generalized Anxiety Score  Nervous, Anxious, on Edge 1 1 0 0  Control/stop worrying 1 0 0 2  Worry too much - different things 1 0 0 2  Trouble relaxing 1 0 0  3  Restless 1 0 0 0  Easily annoyed or irritable 1 1 1 3   Afraid - awful might happen 0 1 0 3  Total GAD 7 Score 6 3 1  13

## 2023-02-10 NOTE — Telephone Encounter (Signed)
Patient has questions about her "Patch" want to know if she can get it wet

## 2023-02-12 NOTE — Telephone Encounter (Signed)
Left message that I am returning her call and that she can get patch wet.  If she continues to have questions to please give the office a call back.   Leonette Nutting

## 2023-02-18 ENCOUNTER — Telehealth: Payer: Self-pay | Admitting: Family Medicine

## 2023-02-18 NOTE — Telephone Encounter (Signed)
Patient would like a call back regarding her results she received, has more questions.

## 2023-02-19 ENCOUNTER — Encounter: Payer: Self-pay | Admitting: Radiation Oncology

## 2023-02-20 ENCOUNTER — Telehealth: Payer: Self-pay | Admitting: General Practice

## 2023-02-20 NOTE — Telephone Encounter (Signed)
Patient called into front office requesting a call back from a nurse for results.   Called patient, no answer- left message to call us back if she still needs assistance.

## 2023-02-20 NOTE — Telephone Encounter (Signed)
Left message that I am returning her call to please give the office call back if continue with questions or concerns.  Jordan [name]

## 2023-02-20 NOTE — Telephone Encounter (Signed)
Patient called back into front office stating she is having difficulty getting her patch to stay on from getting wet in the shower. Patient wants to know if she can use medical tape to help it stay on. Told patient she could but I'd be afraid it would pull the patch off. Suggested using a large bandaid instead since it has a part that isn't sticky. Patient verbalized understanding and states the urologist suggested she use coconut oil around her vagina but she isn't going to use that because that's for cooking. She asked what she could do. Told patient I can suggest stuff from the store but that would cost around $10-15 and I know finances are limited for her. Told patient it is possible the patch could help so let's give it time and see how it does. Patient verbalized understanding.

## 2023-02-24 ENCOUNTER — Ambulatory Visit (INDEPENDENT_AMBULATORY_CARE_PROVIDER_SITE_OTHER): Payer: Medicaid Other | Admitting: Clinical

## 2023-02-24 DIAGNOSIS — Z658 Other specified problems related to psychosocial circumstances: Secondary | ICD-10-CM

## 2023-02-24 DIAGNOSIS — F3160 Bipolar disorder, current episode mixed, unspecified: Secondary | ICD-10-CM

## 2023-02-24 DIAGNOSIS — F4321 Adjustment disorder with depressed mood: Secondary | ICD-10-CM

## 2023-02-25 ENCOUNTER — Telehealth: Payer: Self-pay | Admitting: *Deleted

## 2023-02-25 NOTE — Telephone Encounter (Signed)
RETURNED PATIENT'S PHONE CALL, LVM FOR A RETURN CALL 

## 2023-02-25 NOTE — BH Specialist Note (Signed)
Integrated Behavioral Health via Telemedicine Visit  03/10/2023 Jordan [name] [id number]  Number of Integrated Behavioral Health Clinician visits: 2- Second Visit  Session Start time: 1546   Session End time: 1640  Total time in minutes: 54  Referring Provider: Nettie Elm, MD Patient/Family location: [name] Provider location: Patient Jordan [name] and Jordan [name] Jordan [name]   All persons participating in visit: Patient Jordan [name] and Jordan [name] Jordan [name]   Types of Service: Individual psychotherapy and Video visit  I connected with Jordan [name] and/or Jordan [name]'s  n/a  via  Telephone or Video Enabled Telemedicine Application  (Video is Caregility application) and verified that I am speaking with the correct person using two identifiers. Discussed confidentiality: Yes   I discussed the limitations of telemedicine and the availability of in person appointments.  Discussed there is a possibility of technology failure and discussed alternative modes of communication if that failure occurs.  I discussed that engaging in this telemedicine visit, they consent to the provision of behavioral healthcare and the services will be billed under their insurance.  Patient and/or legal guardian expressed understanding and consented to Telemedicine visit: Yes   Presenting Concerns: Patient and/or family reports the following symptoms/concerns: Processing thoughts and feelings regarding grieving loss of partner about one year ago, as well as distress over pain of ongoing UTI; celebrating reconnection with daughter after a long time of no talking.  Duration of problem: Ongoing; Severity of problem: moderate  Patient and/or Family's Strengths/Protective Factors: Sense of purpose  Goals Addressed: Patient will:  Reduce symptoms of: depression and stress   Increase knowledge and/or ability of: stress reduction   Demonstrate ability to: Increase healthy adjustment to current life  circumstances  Progress towards Goals: Ongoing  Interventions: Interventions utilized:  Motivational Interviewing and Supportive Reflection Standardized Assessments completed:  not given today  Patient and/or Family Response: Patient agrees with treatment plan.   Assessment: Patient currently experiencing Prolonged grief disorder; Bipolar affective disorder; Psychosocial stress  Patient may benefit from continued therapeutic interventions.  Plan: Follow up with behavioral health clinician on : Two weeks Behavioral recommendations:  -Continue plan to attend psychiatry appointments as scheduled at [name]; discuss with psychiatry not taking BH medication  -Continue plan to call daughter again at least once more in the next two weeks; continue positive connections/conversations with neighbor and landlord/all supportive persons in life -Continue to advocate for appropriate healthcare; follow medical provider recommendations  Referral(s): Integrated [employer] (In Clinic)  I discussed the assessment and treatment plan with the patient and/or parent/guardian. They were provided an opportunity to ask questions and all were answered. They agreed with the plan and demonstrated an understanding of the instructions.   They were advised to call back or seek an in-person evaluation if the symptoms worsen or if the condition fails to improve as anticipated.  Jordan Close Elea Holtzcl[place], LCSW     07/18/2022    1:43 PM 04/17/2022   11:19 AM 08/20/2021    2:13 PM 05/24/2016   10:29 AM  Depression screen PHQ 2/9  Decreased Interest 1 0 0 0  Down, Depressed, Hopeless 1 1 0 3  PHQ - 2 Score 2 1 0 3  Altered sleeping 1 1 0 2  Tired, decreased energy 1 0 0 3  Change in appetite 1 0 0 3  Feeling bad or failure about yourself  0 2 0 3  Trouble concentrating  3 0 3  Moving slowly or fidgety/restless 1 0 0 2  Suicidal  thoughts 0 0 0 3  PHQ-9 Score 6 7 0 22      07/18/2022    1:43 PM  04/17/2022   11:19 AM 08/20/2021    2:13 PM 05/24/2016   10:30 AM  GAD 7 : Generalized Anxiety Score  Nervous, Anxious, on Edge 1 1 0 0  Control/stop worrying 1 0 0 2  Worry too much - different things 1 0 0 2  Trouble relaxing 1 0 0 3  Restless 1 0 0 0  Easily annoyed or irritable 1 1 1 3   Afraid - awful might happen 0 1 0 3  Total GAD 7 Score 6 3 1  13

## 2023-02-26 ENCOUNTER — Encounter: Payer: Self-pay | Admitting: Radiation Oncology

## 2023-02-28 ENCOUNTER — Telehealth: Payer: Self-pay | Admitting: *Deleted

## 2023-02-28 NOTE — Telephone Encounter (Signed)
RETURNED PATIENT'S PHONE CALL, LVM FOR A RETURN CALL 

## 2023-03-03 ENCOUNTER — Ambulatory Visit
Admission: RE | Admit: 2023-03-03 | Discharge: 2023-03-03 | Disposition: A | Discharge status: Home / Self Care | Payer: Medicaid Other | Source: Ambulatory Visit | Attending: Radiation Oncology | Admitting: Radiation Oncology

## 2023-03-03 ENCOUNTER — Telehealth: Payer: Self-pay | Admitting: Hematology and Oncology

## 2023-03-03 ENCOUNTER — Inpatient Hospital Stay: Payer: Medicaid Other | Attending: Radiation Oncology

## 2023-03-03 ENCOUNTER — Other Ambulatory Visit: Payer: Self-pay

## 2023-03-03 ENCOUNTER — Inpatient Hospital Stay: Admission: RE | Admit: 2023-03-03 | Payer: Medicaid Other | Source: Ambulatory Visit | Admitting: Radiation Oncology

## 2023-03-03 ENCOUNTER — Ambulatory Visit: Payer: Medicaid Other | Admitting: Radiation Oncology

## 2023-03-03 ENCOUNTER — Telehealth: Payer: Self-pay

## 2023-03-03 DIAGNOSIS — E538 Deficiency of other specified B group vitamins: Secondary | ICD-10-CM | POA: Diagnosis present

## 2023-03-03 DIAGNOSIS — N390 Urinary tract infection, site not specified: Secondary | ICD-10-CM

## 2023-03-03 LAB — CBC WITH DIFFERENTIAL/PLATELET
Abs Immature Granulocytes: 0.01 10*3/uL (ref 0.00–0.07)
Basophils Absolute: 0 10*3/uL (ref 0.0–0.1)
Basophils Relative: 0 %
Eosinophils Absolute: 0 10*3/uL (ref 0.0–0.5)
Eosinophils Relative: 0 %
HCT: 45.7 % (ref 36.0–46.0)
Hemoglobin: 15.7 g/dL — ABNORMAL HIGH (ref 12.0–15.0)
Immature Granulocytes: 0 %
Lymphocytes Relative: 21 %
Lymphs Abs: 1.3 10*3/uL (ref 0.7–4.0)
MCH: 31.7 pg (ref 26.0–34.0)
MCHC: 34.4 g/dL (ref 30.0–36.0)
MCV: 92.1 fL (ref 80.0–100.0)
Monocytes Absolute: 0.6 10*3/uL (ref 0.1–1.0)
Monocytes Relative: 10 %
Neutro Abs: 4.4 10*3/uL (ref 1.7–7.7)
Neutrophils Relative %: 69 %
Platelets: 169 10*3/uL (ref 150–400)
RBC: 4.96 MIL/uL (ref 3.87–5.11)
RDW: 12.3 % (ref 11.5–15.5)
WBC: 6.3 10*3/uL (ref 4.0–10.5)
nRBC: 0 % (ref 0.0–0.2)

## 2023-03-03 LAB — URINALYSIS, COMPLETE (UACMP) WITH MICROSCOPIC
Bilirubin Urine: NEGATIVE
Glucose, UA: NEGATIVE mg/dL
Ketones, ur: NEGATIVE mg/dL
Nitrite: POSITIVE — AB
Protein, ur: NEGATIVE mg/dL
Specific Gravity, Urine: 1.018 (ref 1.005–1.030)
WBC, UA: >50 WBC/hpf (ref 0–5)
pH: 5 (ref 5.0–8.0)

## 2023-03-03 NOTE — Telephone Encounter (Signed)
Returned her call. She is asking about her CBC results. Told her that the lab drew the CBC with her UA collection. The CBC was not ordered for today and the results are good. She verbalized understanding.

## 2023-03-04 ENCOUNTER — Telehealth: Payer: Self-pay

## 2023-03-04 LAB — URINE CULTURE: Culture: >=100000 — AB

## 2023-03-04 NOTE — Telephone Encounter (Signed)
Returned her call. She left a message that she is not feeling well. She is feeling light headed and dizzy. She feels like she may throw up. Left a message to go to urgent care, ED or call PCP. Ask her to call the office back for questions.

## 2023-03-04 NOTE — Telephone Encounter (Signed)
She called and left another message to call her. Called back and repeated the below message to please go to the ED, urgent care or PCP if she is still having complaints and not feeling well.  Ask her to call the office back for questions.

## 2023-03-05 ENCOUNTER — Telehealth: Payer: Self-pay

## 2023-03-05 LAB — URINE CULTURE

## 2023-03-05 NOTE — Telephone Encounter (Signed)
Called patient to make aware of UA C&S results. Left voicemail. Awaiting call back.

## 2023-03-06 ENCOUNTER — Telehealth: Payer: Self-pay

## 2023-03-06 ENCOUNTER — Other Ambulatory Visit: Payer: Self-pay

## 2023-03-06 DIAGNOSIS — N390 Urinary tract infection, site not specified: Secondary | ICD-10-CM

## 2023-03-06 NOTE — Telephone Encounter (Signed)
Returned her call. She left x 2 messages.  She is asking if she needs injections tomorrow. Told her no, Dr. Bertis Ruddy stopped the B12 injections. Tomorrow she is getting IV antibiotics that Dr. Roselind Messier ordered. She verbalized understanding.

## 2023-03-07 ENCOUNTER — Other Ambulatory Visit: Payer: Self-pay

## 2023-03-07 ENCOUNTER — Inpatient Hospital Stay: Payer: Medicaid Other

## 2023-03-07 ENCOUNTER — Ambulatory Visit (HOSPITAL_BASED_OUTPATIENT_CLINIC_OR_DEPARTMENT_OTHER): Payer: Medicaid Other | Admitting: Physician Assistant

## 2023-03-07 ENCOUNTER — Other Ambulatory Visit (HOSPITAL_COMMUNITY): Payer: Self-pay

## 2023-03-07 VITALS — BP 95/61 | HR 73 | Temp 97.0°F | Resp 18

## 2023-03-07 DIAGNOSIS — C52 Malignant neoplasm of vagina: Secondary | ICD-10-CM | POA: Diagnosis not present

## 2023-03-07 DIAGNOSIS — N390 Urinary tract infection, site not specified: Secondary | ICD-10-CM | POA: Diagnosis not present

## 2023-03-07 DIAGNOSIS — E538 Deficiency of other specified B group vitamins: Secondary | ICD-10-CM | POA: Insufficient documentation

## 2023-03-07 MED ORDER — FOSFOMYCIN TROMETHAMINE 3 G PO PACK
3.0000 g | PACK | Freq: Once | ORAL | 0 refills | Status: AC
  Filled 2023-03-07: qty 3, 1d supply, fill #0

## 2023-03-07 MED ORDER — FAMOTIDINE 20 MG IN NS 100 ML IVPB
20.0000 mg | Freq: Once | INTRAVENOUS | Status: AC
  Administered 2023-03-07: 20 mg via INTRAVENOUS

## 2023-03-07 MED ORDER — SODIUM CHLORIDE 0.9 % IV SOLN: Freq: Once | INTRAVENOUS | Status: DC

## 2023-03-07 MED ORDER — DEXTROSE 5 % IV SOLN
1.0000 g | Freq: Once | INTRAVENOUS | Status: AC
  Administered 2023-03-07: 1 g via INTRAVENOUS
  Filled 2023-03-07: qty 10

## 2023-03-07 MED ORDER — SODIUM CHLORIDE 0.9 % IV SOLN: INTRAVENOUS | Status: DC

## 2023-03-07 MED ORDER — DIPHENHYDRAMINE HCL 50 MG/ML IJ SOLN
50.0000 mg | Freq: Once | INTRAMUSCULAR | Status: AC
  Administered 2023-03-07: 50 mg via INTRAVENOUS

## 2023-03-07 MED ORDER — METHYLPREDNISOLONE SODIUM SUCC 125 MG IJ SOLR
125.0000 mg | Freq: Once | INTRAMUSCULAR | Status: AC
  Administered 2023-03-07: 125 mg via INTRAVENOUS

## 2023-03-07 NOTE — Patient Instructions (Signed)
Fosfomycin Powder for Solution What is this medication? FOSFOMYCIN (fos foe MYE sin) treats urinary tract infections caused by bacteria. It works by killing or preventing the growth of bacteria. It belongs to a group of medications called antibiotics. It will not treat colds, the flu, or infections caused by viruses. This medicine may be used for other purposes; ask your health care provider or pharmacist if you have questions. COMMON BRAND NAME(S): Monurol What should I tell my care team before I take this medication? They need to know if you have any of these conditions: Kidney disease An unusual or allergic reaction to fosfomycin, other medications, foods, dyes, or preservatives Pregnant or trying to get pregnant Breast-feeding How should I use this medication? Take this medication by mouth. Take it as directed on the prescription label. Mix the contents of package in 3 to 4 ounces (1/2 cup) of cold water, stir well, and drink. You can take it with or without food. Talk to your care team about the use of this medication in children. Special care may be needed. Overdosage: If you think you have taken too much of this medicine contact a poison control center or emergency room at once. NOTE: This medicine is only for you. Do not share this medicine with others. What if I miss a dose? This does not apply. This medication is taken as a one-time dose. What may interact with this medication? Metoclopramide This list may not describe all possible interactions. Give your health care provider a list of all the medicines, herbs, non-prescription drugs, or dietary supplements you use. Also tell them if you smoke, drink alcohol, or use illegal drugs. Some items may interact with your medicine. What should I watch for while using this medication? Tell your care team if your symptoms do not start to get better in 2 to 3 days or if they get worse. What side effects may I notice from receiving this  medication? Side effects that you should report to your care team as soon as possible: Allergic reactions--skin rash, itching, hives, swelling of the face, lips, tongue, or throat Severe diarrhea, fever Side effects that usually do not require medical attention (report to your care team if they continue or are bothersome): Diarrhea Dizziness Headache Nausea Unusual vaginal discharge, itching, or odor This list may not describe all possible side effects. Call your doctor for medical advice about side effects. You may report side effects to FDA at 1-800-FDA-1088. Where should I keep my medication? Keep out of the reach of children and pets. Store between 15 and 30 degrees C (59 and 86 degrees F). Keep the container tightly closed. Get rid of any unused medication after the expiration date. To get rid of medications that are no longer needed or have expired: Take the medication to a medication take-back program. Check with your pharmacy or law enforcement to find a location. If you cannot return the medication, ask your pharmacist or care team how to get rid of this medication safely. NOTE: This sheet is a summary. It may not cover all possible information. If you have questions about this medicine, talk to your doctor, pharmacist, or health care provider.  2023 Elsevier/Gold Standard (2007-11-28 00:00:00)

## 2023-03-07 NOTE — Progress Notes (Signed)
Symptom Management Consult Note [name]    Patient Care Team: Jordan Paris, NP as PCP - General (Nurse Practitioner)    Name / MRN / DOB: Jordan [name]  [id number]  [birth year]   Date of visit: 03/07/2023   Chief Complaint/Reason for visit: allergic reaction   ASSESSMENT & PLAN: Patient is a 57 y.o. female  with pertinent history of frequent UTIs followed by Dr. Bertis [name] and Dr. Roselind [name].  I have viewed most recent oncology note and lab work.    #UTI -Here for IV rocephin, originally see by rad onc and Dr. Roselind [name] placed orders. Patient with significant allergic reaction during rocephin infusion. Emergency medications given including benadryl, pepcid, solu-medrol and 1L NS. -BP became soft, recovered with the IVF. -Patient seen by me for rash during IV rocephin in the past, reaction was very mild and resolved with benadryl only. -Patient has multiple allergies to antibiotics with reactions of anaphylaxis.  -Discussed with pharmacy who agrees with Fosfomycin for UTI. This will also help patient with compliance as it is a one time dose. Per discussion with pharmacy Fosfomycin can continue to be used in the future for UTIs. -Fosfomysin ordered and will be delivered to her home from [facility] pharmacy. - Patient is agreeable with plan. She was monitored and discharged after returning to baseline.  Strict ED precautions discussed should symptoms worsen.   Heme/Onc History: Oncology History  Vaginal cancer (HCC)  05/28/2019 Initial Diagnosis   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HCC), Stage IA   06/19/2019 Cancer Staging   staging CT scan of the chest abdomen and pelvis.  This revealed that she was status post hysterectomy with no evidence of metastatic disease in the abdomen or pelvis or chest.   06/26/2019 Imaging   MRI of the pelvis with IV contrast: susceptibility artifact in the vaginal cuff from suture material limiting evaluation.  However no focal soft tissue masses  were seen involving the vaginal cuff and no abnormal soft tissue densities in the parametrial regions.  The right and left ovaries were grossly normal.   07/22/2019 Surgery   Robotic assisted radical upper vaginectomy with bilateral pelvic sentinel lymph node biopsy, right salpingo-oophorectomy, left salpingectomy.  Intraoperative findings were significant for no grossly visible vaginal lesions.  The ovaries and fallopian tubes were adherent to the rectum and vaginal cuff in the obliterated cul-de-sac.  The ovaries themselves are grossly normal-appearing but densely adherent with no suspicious lymph nodes.  There is clinical stage I disease, microscopic.   07/22/2019 Cancer Staging   Postoperatively final pathology returned as FIGO stage Ia microscopic poorly differentiated squamous cell carcinoma.  The tumor site was the posterior vaginal wall and measured 0.6 cm.  The posterior margin (the posterior vaginal cuff margin) was broadly positive for tumor cells.  The deep margin was negative.  There was 2.5 mm depth of invasion.  All sentinel lymph nodes were negative for metastases, as was the left fallopian tube and right tube and ovary.   08/24/2019 - 09/22/2019 Radiation Therapy   Vaginal brachytherapy   09/15/2020 PET scan   Soft tissue attenuating structure is noted within the left pelvis measuring 1.7 cm. This was favored to be a left ovary. No clear evidence for recurrence.    01/01/2021 Imaging   Pelvic MRI 1. No evidence of local tumor recurrence at the vaginal resection margin. 2. No evidence of metastatic disease in the pelvis. 3. Left adnexal 2.3 x 1.6 x 1.6 cm structure is compatible  with an atrophic left ovary, unchanged from 08/31/2020 outside CT. 4. Chronic mild diffuse bladder wall thickening, unchanged, potentially the sequela of prior radiation therapy       Interval history-: Jordan [name] is a 57 y.o. female with oncologic history as above presenting to [facility] today with chief  complaint of allergic reaction. Patient presents unaccompanied to the infusion center. Infusion RN assisted in history.  Patient here for IV rocephin for UTI. When the infusion was finishing patient reported itching and RN noticed hives on her arms, chest and face. Patient then complained of difficulty swallowing. She received IV benadryl, solu-medrol, and pepcid with 1 L NS. Patient reports her symptoms started to improve and eventually went away. Patient is supposed to come in the next several days of subsequent doses of IV Rocephin. She did have a mild rash in the past during last IV Rocephin infusion, without any additional symptoms.     ROS  All other systems are reviewed and are negative for acute change except as noted in the HPI.    Allergies  Allergen Reactions   Ceftriaxone Anaphylaxis    See progess note. Administration results in hives, throat pain, flushing and SOB.   Ciprofloxacin Swelling    Lips swell, tongue swells, face swells   Citalopram Other (See Comments)    Possible cause of pancytopenia. Swelling of tongue, face and throat   Lamotrigine Other (See Comments)    Possible cause of pancytopenia. Swelling of face, throat and tongue   Sulfa Antibiotics Anaphylaxis   Tramadol Anaphylaxis, Shortness Of Breath and Swelling     Past Medical History:  Diagnosis Date   Alcohol abuse    Anemia    patient denies   Bipolar 1 disorder (HCC)    No medications currently   CAP (community acquired pneumonia) 03/17/2015   History of radiation therapy 08/24/19-09/22/19   Vaginal brachytherapy   Dr. 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   Mental disorder    Obesity    PICC line infection 05/17/2015   Sepsis due to Gram negative bacteria (MDR E Coli) 02/18/2015   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HCC)    UTI (lower urinary tract infection)    Vaginal Pap smear, abnormal      Past Surgical History:  Procedure Laterality Date    ABDOMINAL HYSTERECTOMY     CERVICAL CONIZATION W/BX N/A 07/01/2016   Procedure: CONIZATION CERVIX WITH BIOPSY;  Surgeon: Jordan Staggers, MD;  Location: [facility];  Service: Gynecology;  Laterality: N/A;   CHOLECYSTECTOMY     EXTERNAL FIXATION LEG Right 04/09/2020   Procedure: EXTERNAL FIXATION LEG;  Surgeon: Bjorn Pippin, MD;  Location: [facility];  Service: Orthopedics;  Laterality: Right;   HYSTEROSCOPY WITH D & C N/A 01/25/2016   Procedure: DILATATION AND CURETTAGE /HYSTEROSCOPY;  Surgeon: Catalina Antigua, MD;  Location: [facility];  Service: Gynecology;  Laterality: N/A;   LYMPH NODE BIOPSY Bilateral 07/22/2019   Procedure: LYMPH NODE BIOPSY;  Surgeon: Adolphus Birchwood, MD;  Location: [facility];  Service: Gynecology;  Laterality: Bilateral;   OPEN REDUCTION INTERNAL FIXATION (ORIF) TIBIA/FIBULA FRACTURE Right 04/10/2020   Procedure: OPEN REDUCTION INTERNAL FIXATION (ORIF) TIBIA/FIBULA FRACTURE;  Surgeon: Roby Lofts, MD;  Location: [facility];  Service: Orthopedics;  Laterality: Right;   ROBOT ASSISTED MYOMECTOMY N/A 07/22/2019   Procedure: XI ROBOTIC ASSISTED LAPAROSCOPIC RADICAL UPPER VAGINECTOMY, LEFT SALPINECTOMY, RIGHT SALPINGOOOPHERECTOMY;  Surgeon: Adolphus Birchwood, MD;  Location: [facility];  Service: Gynecology;  Laterality: N/A;   VAGINAL HYSTERECTOMY N/A 03/11/2017   Procedure: HYSTERECTOMY VAGINAL WITH MORCELLATION;  Surgeon: Jordan Staggers, MD;  Location: [facility];  Service: Gynecology;  Laterality: N/A;    Social History   Socioeconomic History   Marital status: Media planner    Spouse name: Not on file   Number of children: Not on file   Years of education: Not on file   Highest education level: Not on file  Occupational History   Not on file  Tobacco Use   Smoking status: Never   Smokeless tobacco: Never  Vaping Use   Vaping Use: Never used  Substance and Sexual Activity   Alcohol use: Not Currently   Drug use: No   Sexual activity: Yes    Birth control/protection: Post-menopausal, Surgical     Comment: perimenopausal; no sex in years  Other Topics Concern   Not on file  Social History Narrative   Not on file   Social Determinants of Health   Financial Resource Strain: Not on file  Food Insecurity: No Food Insecurity (08/19/2022)   Hunger Vital Sign    Worried About Running Out of Food in the Last Year: Never true    Ran Out of Food in the Last Year: Never true  Recent Concern: Food Insecurity - Food Insecurity Present (08/18/2022)   Hunger Vital Sign    Worried About Running Out of Food in the Last Year: Sometimes true    Ran Out of Food in the Last Year: Sometimes true  Transportation Needs: Unmet Transportation Needs (01/02/2023)   PRAPARE - Administrator, Civil Service (Medical): Yes    Lack of Transportation (Non-Medical): Yes  Physical Activity: Not on file  Stress: Not on file  Social Connections: Not on file  Intimate Partner Violence: Not At Risk (08/18/2022)   Humiliation, Afraid, Rape, and Kick questionnaire    Fear of Current or Ex-Partner: No    Emotionally Abused: No    Physically Abused: No    Sexually Abused: No    Family History  Problem Relation Age of Onset   Alcohol abuse Father    Cancer Father    Breast cancer Neg Hx      Current Outpatient Medications:    fosfomycin (MONUROL) 3 g PACK, Take 3 g by mouth once for 1 dose., Disp: 3 g, Rfl: 0   cephALEXin (KEFLEX) 500 MG capsule, Take one tablet three times a day for 10 days then every bedtime until you see the Urogynecologist, Disp: 150 capsule, Rfl: 0   estradiol (VIVELLE-DOT) 0.075 MG/24HR, Place 1 patch onto the skin 2 (two) times a week., Disp: 8 patch, Rfl: 12   gabapentin (NEURONTIN) 800 MG tablet, Take 800 mg by mouth 3 (three) times daily., Disp: , Rfl:    naproxen (NAPROSYN) 500 MG tablet, Take 500 mg by mouth 2 (two) times daily as needed., Disp: , Rfl:    Oxycodone HCl 10 MG TABS, Take 10 mg by mouth daily as needed., Disp: , Rfl:  No current facility-administered  medications for this visit.  Facility-Administered Medications Ordered in Other Visits:    0.9 %  sodium chloride infusion, , Intravenous, Once, Gorsuch, Ni, MD   0.9 %  sodium chloride infusion, , Intravenous, Continuous, Jordan [name], Ni, MD, Stopped at 03/07/23 0838   0.9 %  sodium chloride infusion, , Intravenous, Once, Walisiewicz, Marliyah Reid E, PA-C   diphenhydrAMINE (BENADRYL) injection 50 mg, 50 mg, Intravenous, Once, Walisiewicz, Caroleen Hamman, PA-C  famotidine (PEPCID) IVPB 20 mg in NS 100 mL IVPB, 20 mg, Intravenous, Once, Walisiewicz, Shiloh Southern E, PA-C   methylPREDNISolone sodium succinate (SOLU-MEDROL) 125 mg/2 mL injection 125 mg, 125 mg, Intravenous, Once, Walisiewicz, Ashrita Chrismer E, PA-C  PHYSICAL EXAM: ECOG FS:1 - Symptomatic but completely ambulatory    Vitals:   03/07/23 0854  BP: 95/61  Pulse: 73  Resp: 18  Temp: (!) 97 F (36.1 C)  SpO2: 100%   Physical Exam Vitals and nursing note reviewed.  Constitutional:      Appearance: She is not ill-appearing or toxic-appearing.     Comments: Airway intact. No tongue swelling. Handling secretions  HENT:     Head: Normocephalic.  Eyes:     Conjunctiva/sclera: Conjunctivae normal.  Cardiovascular:     Rate and Rhythm: Normal rate and regular rhythm.     Pulses: Normal pulses.     Heart sounds: Normal heart sounds.  Pulmonary:     Effort: Pulmonary effort is normal.     Breath sounds: Normal breath sounds. No wheezing.  Abdominal:     General: There is no distension.  Musculoskeletal:     Cervical back: Normal range of motion.  Skin:    General: Skin is warm and dry.     Findings: Rash present. Rash is urticarial.     Comments: Hives on arms, chest, and face  Neurological:     Mental Status: She is alert.        LABORATORY DATA: I have reviewed the data as listed    Latest Ref Rng & Units 03/03/2023    2:47 PM 11/21/2022   11:43 AM 11/08/2022   11:47 AM  CBC  WBC 4.0 - 10.5 K/uL 6.3  4.1  4.7   Hemoglobin 12.0 - 15.0  g/dL 40.9  81.1  91.4   Hematocrit 36.0 - 46.0 % 45.7  46.5  42.0   Platelets 150 - 400 K/uL 169  143  140         Latest Ref Rng & Units 11/21/2022    1:15 PM 11/08/2022   11:47 AM 10/11/2022   11:12 AM  CMP  Glucose 70 - 99 mg/dL 82  82  91   BUN 6 - 20 mg/dL 9  16  12    Creatinine 0.44 - 1.00 mg/dL 7.82  9.56  2.13   Sodium 135 - 145 mmol/L 142  140  140   Potassium 3.5 - 5.1 mmol/L 3.5  4.9  5.4   Chloride 98 - 111 mmol/L 109  106  104   CO2 22 - 32 mmol/L 26  30  28    Calcium 8.9 - 10.3 mg/dL 8.0  9.3  9.3   Total Protein 6.5 - 8.1 g/dL 6.1  6.2    Total Bilirubin 0.3 - 1.2 mg/dL 0.8  0.6    Alkaline Phos 38 - 126 U/L 93  127    AST 15 - 41 U/L 17  24    ALT 0 - 44 U/L 14  27         RADIOGRAPHIC STUDIES (from last 24 hours if applicable) I have personally reviewed the radiological images as listed and agreed with the findings in the report. No results found.      Visit Diagnosis: 1. Allergic reaction, initial encounter   2. Complicated UTI (urinary tract infection)      No orders of the defined types were placed in this encounter.   All questions were answered. The patient knows to  call the clinic with any problems, questions or concerns. No barriers to learning was detected.  A total of more than 30 minutes were spent on this encounter with face-to-face time and non-face-to-face time, including preparing to see the patient, ordering tests and/or medications, counseling the patient and coordination of care as outlined above.    Thank you for allowing me to participate in the care of this patient.    Shanon Ace, PA-C Department of Hematology/Oncology [facility] at [facility] Phone: [phone]  [phone]    03/07/2023 10:20 AM

## 2023-03-07 NOTE — Progress Notes (Signed)
At approximately 8:40am during a rocephin infusion, this pt alerted this RN to feeling itching and burning sensation on her trunk and arms. This RN noted facial flushing, hives on arms bilaterally, and redness on chest.  Karie Fetch PA-C alerted at [phone] by Karn Pickler RN.  At 8:43am 25mg  benadryl was given.  At 8:46 20mg  pepcid was given. At this time the patient began complaining of throat pain, and Benedict Needy confirmed allergic reaction to rocephin infusion.  125 mg Solumedrol was given at 8:55. 25 mg benadryl was given at 8:57. The patient's vitals returned to baseline (refer to chart), and Benedict Needy and Lenon Curt [facility] agreed to have a one time dose of Fosfomycin delivered to the patient's house via [facility] pharmacy. The pt was agreeable to this plan and verbalized understanding. The pt was d/c with permission from [facility]. Pt ambulated to lobby with walker.

## 2023-03-08 ENCOUNTER — Inpatient Hospital Stay: Payer: Medicaid Other

## 2023-03-08 ENCOUNTER — Other Ambulatory Visit (HOSPITAL_COMMUNITY): Payer: Self-pay

## 2023-03-10 ENCOUNTER — Ambulatory Visit: Payer: Medicaid Other | Admitting: Radiation Oncology

## 2023-03-10 ENCOUNTER — Ambulatory Visit (INDEPENDENT_AMBULATORY_CARE_PROVIDER_SITE_OTHER): Payer: Medicaid Other | Admitting: Clinical

## 2023-03-10 ENCOUNTER — Encounter: Payer: Self-pay | Admitting: Radiation Oncology

## 2023-03-10 ENCOUNTER — Other Ambulatory Visit (HOSPITAL_COMMUNITY): Payer: Self-pay

## 2023-03-10 ENCOUNTER — Inpatient Hospital Stay: Payer: Medicaid Other

## 2023-03-10 DIAGNOSIS — F3160 Bipolar disorder, current episode mixed, unspecified: Secondary | ICD-10-CM

## 2023-03-10 DIAGNOSIS — F4381 Prolonged grief disorder: Secondary | ICD-10-CM | POA: Diagnosis not present

## 2023-03-10 DIAGNOSIS — Z658 Other specified problems related to psychosocial circumstances: Secondary | ICD-10-CM

## 2023-03-11 ENCOUNTER — Ambulatory Visit: Payer: Medicaid Other

## 2023-03-12 ENCOUNTER — Telehealth: Payer: Self-pay

## 2023-03-12 ENCOUNTER — Ambulatory Visit: Payer: Medicaid Other

## 2023-03-12 NOTE — Telephone Encounter (Signed)
Returned pt's call. She called [facility] asking to confirm Friday's appt. Confirmed pt's appt. She verbalized agreement.

## 2023-03-12 NOTE — Progress Notes (Signed)
Radiation Oncology         [phone] ________________________________  Name: Jordan [name] MRN: [id number]  Date: 03/13/2023  DOB: 1965-11-15  Follow-Up Visit Note  CC: Courtney Paris, NP  Courtney Paris, NP  No diagnosis found.  Diagnosis: FIGO Stage I (pT1a, pN0) Squamous Cell Carcinoma of the Vagina      Interval Since Last Radiation: 3 years, 5 months, and 21 days  Radiation Treatment Dates: 08/24/2019 through 09/22/2019 Site Technique Total Dose (Gy) Dose per Fx (Gy) Completed Fx Beam Energies  Pelvis: Pelvis_vagina HDR-brachy 30/30 6 5/5 Ir-192   Narrative:  The patient returns today for routine follow-up, she was last seen here for follow-up on 12/12/22.   Since her last visit, the patient continues to deal with recurrent UTI's/cystitis. She requires IV antibiotics (rocephin) given that she does not tolerate oral antibiotics well. She was seen by infectious disease on 01/16/23 to address this further. Given that antibiotics seem to provide minimal to no relief for her, the patient was recommended a referral to urogynecology for further management.   She also met with her OB/GYN, Dr. Alysia Penna, on 01/27/23 for follow up of her menopausal symptoms and recurrent UTI's. She was started on Vivelle dot and advised to keep her urogynecology appt.            She did meet with [facility] on 02/18/23 who collected cultures which came back positive for UTI.   She continued to receive rocephin. However, she did have a significant reaction to rocephin this past week. Dr. Maxine Glenn office contacted the pharmacy who recommends transitioning her to Fosfomycin which she will continue with in the future for recurrent UTI's.         ***  Allergies:  is allergic to ceftriaxone, ciprofloxacin, citalopram, lamotrigine, sulfa antibiotics, and tramadol.  Meds: Current Outpatient Medications  Medication Sig Dispense Refill   cephALEXin (KEFLEX) 500 MG capsule Take one tablet three times a day for  10 days then every bedtime until you see the Urogynecologist 150 capsule 0   estradiol (VIVELLE-DOT) 0.075 MG/24HR Place 1 patch onto the skin 2 (two) times a week. 8 patch 12   gabapentin (NEURONTIN) 800 MG tablet Take 800 mg by mouth 3 (three) times daily.     naproxen (NAPROSYN) 500 MG tablet Take 500 mg by mouth 2 (two) times daily as needed.     Oxycodone HCl 10 MG TABS Take 10 mg by mouth daily as needed.     No current facility-administered medications for this encounter.   Facility-Administered Medications Ordered in Other Encounters  Medication Dose Route Frequency Provider Last Rate Last Admin   0.9 %  sodium chloride infusion   Intravenous Once Artis Delay, MD        Physical Findings: The patient is in no acute distress. Patient is alert and oriented.  vitals were not taken for this visit. .  No significant changes. Lungs are clear to auscultation bilaterally. Heart has regular rate and rhythm. No palpable cervical, supraclavicular, or axillary adenopathy. Abdomen soft, non-tender, normal bowel sounds.  On pelvic examination the external genitalia were unremarkable. A speculum exam was performed. There are no mucosal lesions noted in the vaginal vault. A Pap smear was obtained of the proximal vagina. On bimanual and rectovaginal examination there were no pelvic masses appreciated. ***   Lab Findings: Lab Results  Component Value Date   WBC 6.3 03/03/2023   HGB 15.7 (H) 03/03/2023   HCT 45.7 03/03/2023   MCV 92.1  03/03/2023   PLT 169 03/03/2023    Radiographic Findings: No results found.  Impression: FIGO Stage I (pT1a, pN0) Squamous Cell Carcinoma of the Vagina      The patient is recovering from the effects of radiation.  ***  Plan:  ***   *** minutes of total time was spent for this patient encounter, including preparation, face-to-face counseling with the patient and coordination of care, physical exam, and documentation of the  encounter. ____________________________________  Billie Lade, PhD, MD  This document serves as a record of services personally performed by Antony Blackbird, MD. It was created on his behalf by Neena Rhymes, a trained medical scribe. The creation of this record is based on the scribe's personal observations and the provider's statements to them. This document has been checked and approved by the attending provider.

## 2023-03-13 ENCOUNTER — Encounter: Payer: Self-pay | Admitting: Radiation Oncology

## 2023-03-13 ENCOUNTER — Inpatient Hospital Stay: Payer: Medicaid Other

## 2023-03-13 ENCOUNTER — Ambulatory Visit
Admission: RE | Admit: 2023-03-13 | Discharge: 2023-03-13 | Disposition: A | Discharge status: Home / Self Care | Payer: Medicaid Other | Source: Ambulatory Visit | Attending: Radiation Oncology | Admitting: Radiation Oncology

## 2023-03-13 VITALS — BP 130/68 | HR 75 | Temp 97.0°F | Resp 20 | Wt 264.8 lb

## 2023-03-13 DIAGNOSIS — Z87440 Personal history of urinary (tract) infections: Secondary | ICD-10-CM | POA: Diagnosis not present

## 2023-03-13 DIAGNOSIS — Z923 Personal history of irradiation: Secondary | ICD-10-CM | POA: Insufficient documentation

## 2023-03-13 DIAGNOSIS — Z79899 Other long term (current) drug therapy: Secondary | ICD-10-CM | POA: Diagnosis not present

## 2023-03-13 DIAGNOSIS — C52 Malignant neoplasm of vagina: Secondary | ICD-10-CM

## 2023-03-13 DIAGNOSIS — Z8544 Personal history of malignant neoplasm of other female genital organs: Secondary | ICD-10-CM | POA: Diagnosis present

## 2023-03-13 DIAGNOSIS — N390 Urinary tract infection, site not specified: Secondary | ICD-10-CM | POA: Insufficient documentation

## 2023-03-13 NOTE — Progress Notes (Signed)
Jordan [name] is here today for follow up post radiation to the pelvic.  They completed their radiation on: 09/22/19   Does the patient complain of any of the following:  Pain: No Abdominal bloating: No Diarrhea/Constipation: No Nausea/Vomiting: No Vaginal Discharge: No Blood in Urine or Stool: No Urinary Issues (dysuria/incomplete emptying/ incontinence/ increased frequency/urgency): Yes, dysuria, and odor. Patient was placed on antibiotics by urologist, however patient not taking medication as prescribed. Does patient report using vaginal dilator 2-3 times a week and/or sexually active 2-3 weeks: No Post radiation skin changes: No   Additional comments if applicable:   BP 130/68   Pulse 75   Temp (!) 97 F (36.1 C)   Resp 20   Wt 264 lb 12.8 oz (120.1 kg)   LMP  (LMP Unknown)   SpO2 98%   BMI 40.26 kg/m

## 2023-03-13 NOTE — BH Specialist Note (Unsigned)
Integrated Behavioral Health Follow Up In-Person Visit  MRN: [id number] Name: Jordan [name]  Number of Integrated Behavioral Health Clinician visits: 3- Third Visit  Session Start time: 1555   Session End time: 1640  Total time in minutes: 45   Types of Service: Individual psychotherapy  Interpretor:No. Interpretor Name and Language: n/a  Subjective: Jordan [name] is a 57 y.o. female accompanied by  n/a Patient was referred by Nettie Elm, MD for positive depression screen. Patient reports the following symptoms/concerns: Ongoing distress over loss of partner, persistent UTI's, loneliness; working on not allowing herself to worry excessively over daughter.  Duration of problem: Ongoing; Severity of problem:  moderately severe  Objective: Mood: Irritable and Affect: Tearful Risk of harm to self or others: No plan to harm self or others  Life Context: Family and Social: Pt lives by herself  School/Work: Disability Self-Care: Nutritional therapist Life Changes: Loss of partner over one year  Patient and/or Family's Strengths/Protective Factors: Social connections, Concrete supports in place (healthy food, safe environments, etc.), and Sense of purpose  Goals Addressed: Patient will:  Reduce symptoms of: agitation, anxiety, depression, mood instability, and stress   Increase knowledge and/or ability of: stress reduction   Demonstrate ability to: Increase motivation to adhere to plan of care  Progress towards Goals: Ongoing  Interventions: Interventions utilized:  Motivational Interviewing Standardized Assessments completed: GAD-7 and PHQ 9  Patient and/or Family Response: Patient agrees with treatment plan.   Patient Centered Plan: Patient is on the following Treatment Plan(s): IBH Assessment: Patient currently experiencing Prolonged grief disorder, Bipolar affective disorder; Psychosocial stress.   Patient may benefit from continued therapeutic intervention   .  Plan: Follow up with behavioral health clinician on : Three weeks Behavioral recommendations:  -Continue to attend psychiatry appointments at [facility] -Consider starting [facility] medications as prescribed by psychiatry -Continue to maintain communication with daughter, as available; daily conversations with supportive people in life -Consider letting go of emotions related to things outside personal realm of control Referral(s): Integrated Art [employer] (In Clinic) and Community Resources:  Food  [name] [place], [place]     03/27/2023    4:22 PM 07/18/2022    1:43 PM 04/17/2022   11:19 AM 08/20/2021    2:13 PM 05/24/2016   10:29 AM  Depression screen PHQ 2/9  Decreased Interest 0 1 0 0 0  Down, Depressed, Hopeless 1 1 1  0 3  PHQ - 2 Score 1 2 1  0 3  Altered sleeping 2 1 1  0 2  Tired, decreased energy 1 1 0 0 3  Change in appetite 0 1 0 0 3  Feeling bad or failure about yourself  0 0 2 0 3  Trouble concentrating 1  3 0 3  Moving slowly or fidgety/restless 1 1 0 0 2  Suicidal thoughts 0 0 0 0 3  PHQ-9 Score 6 6 7  0 22      03/27/2023    4:25 PM 07/18/2022    1:43 PM 04/17/2022   11:19 AM 08/20/2021    2:13 PM  GAD 7 : Generalized Anxiety Score  Nervous, Anxious, on Edge 2 1 1  0  Control/stop worrying 1 1 0 0  Worry too much - different things 1 1 0 0  Trouble relaxing 1 1 0 0  Restless 1 1 0 0  Easily annoyed or irritable 1 1 1 1   Afraid - awful might happen 1 0 1 0  Total GAD 7 Score 8 6 3  1

## 2023-03-14 ENCOUNTER — Encounter: Payer: Self-pay | Admitting: Hematology and Oncology

## 2023-03-14 ENCOUNTER — Inpatient Hospital Stay (HOSPITAL_BASED_OUTPATIENT_CLINIC_OR_DEPARTMENT_OTHER): Payer: Medicaid Other | Admitting: Hematology and Oncology

## 2023-03-14 ENCOUNTER — Telehealth: Payer: Self-pay

## 2023-03-14 ENCOUNTER — Inpatient Hospital Stay: Payer: Medicaid Other

## 2023-03-14 ENCOUNTER — Other Ambulatory Visit: Payer: Self-pay

## 2023-03-14 VITALS — BP 129/63 | HR 90 | Temp 98.2°F | Resp 18 | Ht 68.0 in | Wt 266.4 lb

## 2023-03-14 DIAGNOSIS — E538 Deficiency of other specified B group vitamins: Secondary | ICD-10-CM

## 2023-03-14 LAB — CBC WITH DIFFERENTIAL/PLATELET
Abs Immature Granulocytes: 0.02 10*3/uL (ref 0.00–0.07)
Basophils Absolute: 0 10*3/uL (ref 0.0–0.1)
Basophils Relative: 0 %
Eosinophils Absolute: 0 10*3/uL (ref 0.0–0.5)
Eosinophils Relative: 0 %
HCT: 41.2 % (ref 36.0–46.0)
Hemoglobin: 14.1 g/dL (ref 12.0–15.0)
Immature Granulocytes: 0 %
Lymphocytes Relative: 23 %
Lymphs Abs: 1.2 10*3/uL (ref 0.7–4.0)
MCH: 31.9 pg (ref 26.0–34.0)
MCHC: 34.2 g/dL (ref 30.0–36.0)
MCV: 93.2 fL (ref 80.0–100.0)
Monocytes Absolute: 0.5 10*3/uL (ref 0.1–1.0)
Monocytes Relative: 10 %
Neutro Abs: 3.6 10*3/uL (ref 1.7–7.7)
Neutrophils Relative %: 67 %
Platelets: 151 10*3/uL (ref 150–400)
RBC: 4.42 MIL/uL (ref 3.87–5.11)
RDW: 12.4 % (ref 11.5–15.5)
WBC: 5.4 10*3/uL (ref 4.0–10.5)
nRBC: 0 % (ref 0.0–0.2)

## 2023-03-14 LAB — VITAMIN B12: Vitamin B-12: 176 pg/mL — ABNORMAL LOW (ref 180–914)

## 2023-03-14 NOTE — Telephone Encounter (Signed)
Called and LVM with low vitamin B12 level result. Advised Pt that Dr. Bertis Ruddy would like to resume monthly B12 injections and that one of our schedulers will be in touch with her either today or some time next week. Advised Pt that we are closed this coming Monday, 03/17/23. Gave call back number with any questions.

## 2023-03-14 NOTE — Progress Notes (Signed)
[facility] OFFICE PROGRESS NOTE  Patient Care Team: Courtney Paris, NP as PCP - General (Nurse Practitioner)  ASSESSMENT & PLAN:  Vitamin B12 deficiency She could not recall the reason why this appointment is made Now that she is here, I plan to repeat CBC and B12 level and will call her with test results  Orders Placed This Encounter  Procedures   Vitamin B12    Standing Status:   Future    Number of Occurrences:   1    Standing Expiration Date:   03/13/2024   CBC with Differential/Platelet    Standing Status:   Future    Number of Occurrences:   1    Standing Expiration Date:   03/13/2024    All questions were answered. The patient knows to call the clinic with any problems, questions or concerns. The total time spent in the appointment was 20 minutes encounter with patients including review of chart and various tests results, discussions about plan of care and coordination of care plan   Artis Delay, MD 03/14/2023 2:28 PM  INTERVAL HISTORY: Please see below for problem oriented charting. she returns for treatment follow-up She is not due for vitamin B12 injection until July She could not recall why this appointment is scheduled She feels fine We reviewed recent CBC results  REVIEW OF SYSTEMS:   Constitutional: Denies fevers, chills or abnormal weight loss Eyes: Denies blurriness of vision Ears, nose, mouth, throat, and face: Denies mucositis or sore throat Respiratory: Denies cough, dyspnea or wheezes Cardiovascular: Denies palpitation, chest discomfort or lower extremity swelling Gastrointestinal:  Denies nausea, heartburn or change in bowel habits Skin: Denies abnormal skin rashes Lymphatics: Denies new lymphadenopathy or easy bruising Neurological:Denies numbness, tingling or new weaknesses Behavioral/Psych: Mood is stable, no new changes  All other systems were reviewed with the patient and are negative.  I have reviewed the past medical history,  past surgical history, social history and family history with the patient and they are unchanged from previous note.  ALLERGIES:  is allergic to ceftriaxone, ciprofloxacin, citalopram, lamotrigine, sulfa antibiotics, and tramadol.  MEDICATIONS:  Current Outpatient Medications  Medication Sig Dispense Refill   cephALEXin (KEFLEX) 500 MG capsule Take one tablet three times a day for 10 days then every bedtime until you see the Urogynecologist 150 capsule 0   estradiol (VIVELLE-DOT) 0.075 MG/24HR Place 1 patch onto the skin 2 (two) times a week. 8 patch 12   gabapentin (NEURONTIN) 800 MG tablet Take 800 mg by mouth 3 (three) times daily.     naproxen (NAPROSYN) 500 MG tablet Take 500 mg by mouth 2 (two) times daily as needed.     Oxycodone HCl 10 MG TABS Take 10 mg by mouth daily as needed.     No current facility-administered medications for this visit.   Facility-Administered Medications Ordered in Other Visits  Medication Dose Route Frequency Provider Last Rate Last Admin   0.9 %  sodium chloride infusion   Intravenous Once Artis Delay, MD        SUMMARY OF ONCOLOGIC HISTORY: Oncology History  Vaginal cancer (HCC)  05/28/2019 Initial Diagnosis   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HCC), Stage IA   06/19/2019 Cancer Staging   staging CT scan of the chest abdomen and pelvis.  This revealed that she was status post hysterectomy with no evidence of metastatic disease in the abdomen or pelvis or chest.   06/26/2019 Imaging   MRI of the pelvis with IV contrast: susceptibility  artifact in the vaginal cuff from suture material limiting evaluation.  However no focal soft tissue masses were seen involving the vaginal cuff and no abnormal soft tissue densities in the parametrial regions.  The right and left ovaries were grossly normal.   07/22/2019 Surgery   Robotic assisted radical upper vaginectomy with bilateral pelvic sentinel lymph node biopsy, right salpingo-oophorectomy, left salpingectomy.   Intraoperative findings were significant for no grossly visible vaginal lesions.  The ovaries and fallopian tubes were adherent to the rectum and vaginal cuff in the obliterated cul-de-sac.  The ovaries themselves are grossly normal-appearing but densely adherent with no suspicious lymph nodes.  There is clinical stage I disease, microscopic.   07/22/2019 Cancer Staging   Postoperatively final pathology returned as FIGO stage Ia microscopic poorly differentiated squamous cell carcinoma.  The tumor site was the posterior vaginal wall and measured 0.6 cm.  The posterior margin (the posterior vaginal cuff margin) was broadly positive for tumor cells.  The deep margin was negative.  There was 2.5 mm depth of invasion.  All sentinel lymph nodes were negative for metastases, as was the left fallopian tube and right tube and ovary.   08/24/2019 - 09/22/2019 Radiation Therapy   Vaginal brachytherapy   09/15/2020 PET scan   Soft tissue attenuating structure is noted within the left pelvis measuring 1.7 cm. This was favored to be a left ovary. No clear evidence for recurrence.    01/01/2021 Imaging   Pelvic MRI 1. No evidence of local tumor recurrence at the vaginal resection margin. 2. No evidence of metastatic disease in the pelvis. 3. Left adnexal 2.3 x 1.6 x 1.6 cm structure is compatible with an atrophic left ovary, unchanged from 08/31/2020 outside CT. 4. Chronic mild diffuse bladder wall thickening, unchanged, potentially the sequela of prior radiation therapy   03/14/2023 Cancer Staging   Staging form: Vagina, AJCC 8th Edition - Clinical: Stage IA (cT1a, cN0, cM0) - Signed by Artis Delay, MD on 03/14/2023 Stage prefix: Initial diagnosis     PHYSICAL EXAMINATION: ECOG PERFORMANCE STATUS: 0 - Asymptomatic  Vitals:   03/14/23 1217  BP: 129/63  Pulse: 90  Resp: 18  Temp: 98.2 F (36.8 C)  SpO2: 96%   Filed Weights   03/14/23 1217  Weight: 266 lb 6.4 oz (120.8 kg)    GENERAL:alert, no  distress and comfortable  NEURO: alert & oriented x 3 with fluent speech, no focal motor/sensory deficits  LABORATORY DATA:  I have reviewed the data as listed    Component Value Date/Time   NA 142 11/21/2022 1315   K 3.5 11/21/2022 1315   CL 109 11/21/2022 1315   CO2 26 11/21/2022 1315   GLUCOSE 82 11/21/2022 1315   BUN 9 11/21/2022 1315   CREATININE 0.55 11/21/2022 1315   CREATININE 0.62 11/08/2022 1147   CALCIUM 8.0 (L) 11/21/2022 1315   PROT 6.1 (L) 11/21/2022 1315   ALBUMIN 3.3 (L) 11/21/2022 1315   AST 17 11/21/2022 1315   AST 24 11/08/2022 1147   ALT 14 11/21/2022 1315   ALT 27 11/08/2022 1147   ALKPHOS 93 11/21/2022 1315   BILITOT 0.8 11/21/2022 1315   BILITOT 0.6 11/08/2022 1147   GFRNONAA >60 11/21/2022 1315   GFRNONAA >60 11/08/2022 1147   GFRAA >60 05/03/2020 0707    No results found for: "SPEP", "UPEP"  Lab Results  Component Value Date   WBC 5.4 03/14/2023   NEUTROABS 3.6 03/14/2023   HGB 14.1 03/14/2023   HCT 41.2 03/14/2023  MCV 93.2 03/14/2023   PLT 151 03/14/2023      Chemistry      Component Value Date/Time   NA 142 11/21/2022 1315   K 3.5 11/21/2022 1315   CL 109 11/21/2022 1315   CO2 26 11/21/2022 1315   BUN 9 11/21/2022 1315   CREATININE 0.55 11/21/2022 1315   CREATININE 0.62 11/08/2022 1147      Component Value Date/Time   CALCIUM 8.0 (L) 11/21/2022 1315   ALKPHOS 93 11/21/2022 1315   AST 17 11/21/2022 1315   AST 24 11/08/2022 1147   ALT 14 11/21/2022 1315   ALT 27 11/08/2022 1147   BILITOT 0.8 11/21/2022 1315   BILITOT 0.6 11/08/2022 1147

## 2023-03-14 NOTE — Telephone Encounter (Signed)
Pt called to confirm her MD appt today at 1240. Pt also states she has transportation through [facility] and does not need [place] transportation today. [place] on [place], [place] notified.

## 2023-03-14 NOTE — Addendum Note (Signed)
Addended byBertis Ruddy, Cathy Ropp on: 03/14/2023 02:31 PM   Modules accepted: Orders

## 2023-03-14 NOTE — Assessment & Plan Note (Addendum)
She could not recall the reason why this appointment is made Now that she is here, I plan to repeat CBC and B12 level and will call her with test results At the time of dictation, her B12 level came back low We will resume vitamin B12 injection and we will call the patient for appointment There has been significant fluctuation of her B12 level over the past few months I recommend we schedule the patient to get vitamin B12 injection monthly

## 2023-03-18 ENCOUNTER — Telehealth: Payer: Self-pay

## 2023-03-18 ENCOUNTER — Telehealth: Payer: Self-pay | Admitting: Hematology and Oncology

## 2023-03-18 NOTE — Telephone Encounter (Signed)
She called back. Given below message regarding B12 and need for monthly B12 injections. She verbalized understanding and appreciated the call.

## 2023-03-18 NOTE — Telephone Encounter (Signed)
Spoke with patient confirming upcoming appointment  

## 2023-03-18 NOTE — Telephone Encounter (Signed)
Returned her call regarding lab work. Left a message, that her B12 is low and Dr. Bertis Ruddy wants her to start getting monthly B12 injections. The scheduler will be calling her to schedule.

## 2023-03-19 ENCOUNTER — Telehealth: Payer: Self-pay

## 2023-03-19 NOTE — Telephone Encounter (Signed)
She called back and is concerned that the B12 injection is scheduled too far out and she wants to get it this Friday 5/31. Sent a message to scheduler to call her.

## 2023-03-19 NOTE — Telephone Encounter (Signed)
Returned her call and reviewed appt date/time for 5/31 at 2:15 pm. She is aware of appt.

## 2023-03-20 ENCOUNTER — Telehealth: Payer: Self-pay

## 2023-03-20 NOTE — Telephone Encounter (Signed)
Returned her call and left a message for her to call the office back for questions. Left appt details for tomorrow appt time for injection.

## 2023-03-21 ENCOUNTER — Telehealth: Payer: Self-pay

## 2023-03-21 ENCOUNTER — Encounter: Payer: Self-pay | Admitting: Radiation Oncology

## 2023-03-21 ENCOUNTER — Other Ambulatory Visit: Payer: Self-pay | Admitting: Hematology and Oncology

## 2023-03-21 ENCOUNTER — Inpatient Hospital Stay: Payer: Medicaid Other

## 2023-03-21 DIAGNOSIS — E538 Deficiency of other specified B group vitamins: Secondary | ICD-10-CM | POA: Diagnosis not present

## 2023-03-21 MED ORDER — CYANOCOBALAMIN 1000 MCG/ML IJ SOLN
1000.0000 ug | Freq: Once | INTRAMUSCULAR | Status: AC
  Administered 2023-03-21: 1000 ug via INTRAMUSCULAR
  Filled 2023-03-21: qty 1

## 2023-03-21 NOTE — Telephone Encounter (Signed)
Returned her call and left a message with the appt time today for B12 injection at [facility], her appt is unchanged.

## 2023-03-24 ENCOUNTER — Telehealth: Payer: Self-pay

## 2023-03-24 NOTE — Telephone Encounter (Signed)
Returned her call. Reviewed upcoming appts. She verbalized understanding. Will mail her a schedule as she requested. Verified address and schedule mailed out.

## 2023-03-25 ENCOUNTER — Inpatient Hospital Stay: Payer: Medicaid Other

## 2023-03-27 ENCOUNTER — Ambulatory Visit (INDEPENDENT_AMBULATORY_CARE_PROVIDER_SITE_OTHER): Payer: Medicaid Other | Admitting: Clinical

## 2023-03-27 DIAGNOSIS — F4381 Prolonged grief disorder: Secondary | ICD-10-CM

## 2023-03-27 DIAGNOSIS — F3160 Bipolar disorder, current episode mixed, unspecified: Secondary | ICD-10-CM

## 2023-03-27 DIAGNOSIS — Z658 Other specified problems related to psychosocial circumstances: Secondary | ICD-10-CM

## 2023-03-28 ENCOUNTER — Telehealth: Payer: Self-pay

## 2023-03-28 NOTE — Telephone Encounter (Signed)
Returned her call and reviewed appts at [facility]. She verbalized understanding and appreciated the call.

## 2023-04-03 NOTE — BH Specialist Note (Signed)
Integrated Behavioral Health Follow Up In-Person Visit  MRN: [id number] Name: Jordan [name]  Number of Integrated Behavioral Health Clinician visits: 3- Third Visit  Session Start time: 1555   Session End time: 1640  Total time in minutes: 45   Types of Service: Individual psychotherapy  Interpretor:No. Interpretor Name and Language: n/a  Subjective: Jordan [name] is a 57 y.o. female accompanied by  n/a Patient was referred by Nettie Elm, MD for depression screening. Patient reports the following symptoms/concerns: Distressed over changes to Medicaid from previous plan to [facility] plan, worries that she will be unable to continue with current medical providers; also worries about daughter's health, nosy neighbors; prevented from taking [employer] classes for illiteracy as they are requiring she take [facility] medication prescribed for bipolar disorder, but prefers not to take due to side effect/drowsiness.  Duration of problem: Ongoing; Severity: moderate  Patient Centered Plan: Patient is on the following Treatment Plan(s): IBH Assessment: Patient currently experiencing Prolonged grief disorder, Bipolar affective disorder, Psychosocial stress .   Patient may benefit from continued therapeutic intervention  .  Plan: Follow up with behavioral health clinician on : Two weeks Behavioral recommendations:  -Continue routine psychiatry appointments at [facility]  -Continue maintaining routine communication with daughter; daily contact with supportive people in life -Continue to consider focusing on things within realm of control  -Call [employer] at number provided today; ask to speak to case manager Referral): Integrated [employer] (In Clinic)  [place], [place]

## 2023-04-08 ENCOUNTER — Encounter: Payer: Self-pay | Admitting: General Practice

## 2023-04-08 NOTE — Progress Notes (Signed)
[facility] Spiritual Care Note  Received and returned check-in voicemail from [place].   [address] [name], [place], [facility] Pager [phone] Voicemail [phone]

## 2023-04-10 ENCOUNTER — Encounter: Payer: Self-pay | Admitting: Hematology and Oncology

## 2023-04-14 ENCOUNTER — Encounter: Payer: Self-pay | Admitting: General Practice

## 2023-04-14 ENCOUNTER — Ambulatory Visit (INDEPENDENT_AMBULATORY_CARE_PROVIDER_SITE_OTHER): Payer: Medicaid Other | Admitting: Clinical

## 2023-04-14 DIAGNOSIS — F4381 Prolonged grief disorder: Secondary | ICD-10-CM

## 2023-04-14 DIAGNOSIS — F3160 Bipolar disorder, current episode mixed, unspecified: Secondary | ICD-10-CM

## 2023-04-14 DIAGNOSIS — Z658 Other specified problems related to psychosocial circumstances: Secondary | ICD-10-CM

## 2023-04-14 NOTE — Progress Notes (Signed)
[facility] Spiritual Care Note  Received and returned voicemails from [place] again.   [id number] [name], [place], [facility] Pager [phone] Voicemail [phone]

## 2023-04-15 NOTE — BH Specialist Note (Unsigned)
Integrated Behavioral Health via Telemedicine Visit  04/29/2023 Jordan [name] [id number]  Number of Integrated Behavioral Health Clinician visits: 5-Fifth Visit  Session Start time: 1435   Session End time: 1502  Total time in minutes: 27   Referring Provider: Nettie Elm, MD Patient/Family location: Home [facility] Provider location: [name] for [facility]  All persons participating in visit: Patient Jordan [name] and [name] Jordan [name]   Types of Service: Individual psychotherapy  I connected with Jordan [name] and/or Jordan [name]'s  n/a  via  Telephone or Video Enabled Telemedicine Application  (Video is Caregility application) and verified that I am speaking with the correct person using two identifiers. Discussed confidentiality: Yes   I discussed the limitations of telemedicine and the availability of in person appointments.  Discussed there is a possibility of technology failure and discussed alternative modes of communication if that failure occurs.  I discussed that engaging in this telemedicine visit, they consent to the provision of behavioral healthcare and the services will be billed under their insurance.  Patient and/or legal guardian expressed understanding and consented to Telemedicine visit: Yes   Presenting Concerns: Patient and/or family reports the following symptoms/concerns: Stress and uncertainty over switch to Jordan [name]; anxiety regarding transportation and other changes; worry that she may not be able to see all of her providers.  Duration of problem: Ongoing with recent increase; Severity of problem: moderate  Patient and/or Family's Strengths/Protective Factors: Social connections, Concrete supports in place (healthy food, safe environments, etc.), and Sense of purpose  Goals Addressed: Patient will:  Reduce symptoms of: agitation, anxiety, depression, mood instability, and stress   Increase knowledge and/or  ability of: stress reduction   Demonstrate ability to: Increase motivation to adhere to plan of care  Progress towards Goals: Ongoing  Interventions: Interventions utilized:  Motivational Interviewing Standardized Assessments completed: Not Needed  Patient and/or Family Response: Patient agrees with treatment plan.   Assessment: Patient currently experiencing Prolonged grief disorder; Bipolar affective disorder; Psychosocial stress  Patient may benefit from continued therapeutic intervention  .  Plan: Follow up with behavioral health clinician on : Two weeks Behavioral recommendations:  -Continue psychiatry appointments at [facility]; discuss with psychiatrists not taking BH medication -Continue maintaining connections with daughter and supportive neighbors -Consider keeping an open mind to current changes with [employer], as discussed Referral(s): Integrated [employer] (In Clinic)  I discussed the assessment and treatment plan with the patient and/or parent/guardian. They were provided an opportunity to ask questions and all were answered. They agreed with the plan and demonstrated an understanding of the instructions.   They were advised to call back or seek an in-person evaluation if the symptoms worsen or if the condition fails to improve as anticipated.  Jordan Close [name], LCSW     03/27/2023    4:22 PM 07/18/2022    1:43 PM 04/17/2022   11:19 AM 08/20/2021    2:13 PM 05/24/2016   10:29 AM  Depression screen PHQ 2/9  Decreased Interest 0 1 0 0 0  Down, Depressed, Hopeless 1 1 1  0 3  PHQ - 2 Score 1 2 1  0 3  Altered sleeping 2 1 1  0 2  Tired, decreased energy 1 1 0 0 3  Change in appetite 0 1 0 0 3  Feeling bad or failure about yourself  0 0 2 0 3  Trouble concentrating 1  3 0 3  Moving slowly or fidgety/restless 1 1 0 0 2  Suicidal thoughts 0 0 0 0 3  PHQ-9 Score 6 6 7  0 22      03/27/2023    4:25 PM 07/18/2022    1:43 PM 04/17/2022   11:19 AM  08/20/2021    2:13 PM  GAD 7 : Generalized Anxiety Score  Nervous, Anxious, on Edge 2 1 1  0  Control/stop worrying 1 1 0 0  Worry too much - different things 1 1 0 0  Trouble relaxing 1 1 0 0  Restless 1 1 0 0  Easily annoyed or irritable 1 1 1 1   Afraid - awful might happen 1 0 1 0  Total GAD 7 Score 8 6 3  1

## 2023-04-17 ENCOUNTER — Telehealth: Payer: Self-pay

## 2023-04-17 NOTE — Telephone Encounter (Signed)
Called her back and given appt date/ time for tomorrow. She verbalized understanding and appreciated the call.

## 2023-04-18 ENCOUNTER — Inpatient Hospital Stay: Payer: Medicaid Other | Attending: Radiation Oncology

## 2023-04-18 ENCOUNTER — Other Ambulatory Visit: Payer: Self-pay

## 2023-04-18 VITALS — BP 114/74 | HR 100 | Temp 97.7°F | Resp 16

## 2023-04-18 DIAGNOSIS — E538 Deficiency of other specified B group vitamins: Secondary | ICD-10-CM | POA: Insufficient documentation

## 2023-04-18 MED ORDER — CYANOCOBALAMIN 1000 MCG/ML IJ SOLN
1000.0000 ug | Freq: Once | INTRAMUSCULAR | Status: AC
  Administered 2023-04-18: 1000 ug via INTRAMUSCULAR

## 2023-04-18 NOTE — Patient Instructions (Signed)

## 2023-04-21 ENCOUNTER — Encounter: Payer: Self-pay | Admitting: Hematology and Oncology

## 2023-04-22 ENCOUNTER — Inpatient Hospital Stay: Payer: MEDICAID

## 2023-04-23 ENCOUNTER — Telehealth: Payer: Self-pay

## 2023-04-23 ENCOUNTER — Telehealth: Payer: Self-pay | Admitting: Hematology and Oncology

## 2023-04-23 NOTE — Telephone Encounter (Signed)
Returned her call and left a message. A scheduling message has been sent for her request to change her 7/26 injection appt date.

## 2023-04-23 NOTE — Telephone Encounter (Signed)
Spoke with patient confirming upcoming appointment  

## 2023-04-25 ENCOUNTER — Telehealth: Payer: Self-pay

## 2023-04-25 NOTE — Telephone Encounter (Signed)
Returned her call regarding upcoming appt, reviewed appt date. She verbalized understanding.

## 2023-04-28 ENCOUNTER — Ambulatory Visit (INDEPENDENT_AMBULATORY_CARE_PROVIDER_SITE_OTHER): Payer: MEDICAID | Admitting: Clinical

## 2023-04-28 ENCOUNTER — Encounter: Payer: Self-pay | Admitting: General Practice

## 2023-04-28 DIAGNOSIS — F4381 Prolonged grief disorder: Secondary | ICD-10-CM

## 2023-04-28 DIAGNOSIS — F3160 Bipolar disorder, current episode mixed, unspecified: Secondary | ICD-10-CM | POA: Diagnosis not present

## 2023-04-28 DIAGNOSIS — Z658 Other specified problems related to psychosocial circumstances: Secondary | ICD-10-CM

## 2023-04-28 NOTE — Progress Notes (Signed)
[facility] Spiritual Care Note  Received and returned voicemail from [place].   Chaplain Travonte Byard, MDiv, [facility] Pager [phone] Voicemail [phone] 

## 2023-04-29 ENCOUNTER — Inpatient Hospital Stay: Payer: MEDICAID | Admitting: Hematology and Oncology

## 2023-04-29 ENCOUNTER — Inpatient Hospital Stay: Payer: MEDICAID

## 2023-04-29 ENCOUNTER — Other Ambulatory Visit: Payer: Medicaid Other

## 2023-04-29 NOTE — BH Specialist Note (Signed)
Integrated Behavioral Health Follow Up In-Person Visit  MRN: [id number] Name: Jordan [name]  Number of Integrated Behavioral Health Clinician visits: 6-Sixth Visit  Session Start time: 1447   Session End time: 1551  Total time in minutes: 64   Types of Service: Individual psychotherapy  Interpretor:No. Interpretor Name and Language: n/a  Subjective: Jordan [name] is a 57 y.o. female accompanied by  n/a Patient was referred by Nettie Elm, MD for positive depression screening. Patient reports the following symptoms/concerns: Today was deceased partner's birthday; continued distress and confusion over insurance changes and fear over what her healthcare will look like moving forward.  Duration of problem: Ongoing; Severity of problem: moderate  Objective: Mood: Dysphoric and Affect: Appropriate Risk of harm to self or others: No plan to harm self or others  Life Context: Family and Social: Pt lives by herself; has grown daughter out-of[place]state School/Work: Disability Life Changes: Insurance changes with [employer], loss of partner over one year ago  Patient and/or Family's Strengths/Protective Factors: Social connections, Concrete supports in place (healthy food, safe environments, etc.), and Sense of purpose  Goals Addressed: Patient will:  Reduce symptoms of: agitation, anxiety, depression, mood instability, and stress   Increase knowledge and/or ability of: stress reduction   Demonstrate ability to: Increase motivation to adhere to plan of care  Progress towards Goals: Ongoing  Interventions: Interventions utilized:  Motivational Interviewing and Supportive Reflection Standardized Assessments completed: Not Needed  Patient and/or Family Response: Patient agrees with treatment plan.   Patient Centered Plan: Patient is on the following Treatment Plan(s): IBH Assessment: Patient currently experiencing Prolonged grief disorder; Bipolar affective disorder;  Psychosocial stress.   Patient may benefit from continued therapeutic intervention  .  Plan: Follow up with behavioral health clinician on : Two weeks Behavioral recommendations:  -Continue seeing psychiatrists at [facility]; continue with other medical appointments and follow medical recommendations -Continue efforts to reach out to daughter; maintaining connections with supportive neighbors and landlord -Consider spending time today in remembrance of boyfriend (listening to his favorite music; eating favorite foods, etc.) as discussed, to celebrate the life he lived Referral(s): [facility] [employer] [facility] (In Clinic)  [name] [place], [place]     03/27/2023    4:22 PM 07/18/2022    1:43 PM 04/17/2022   11:19 AM 08/20/2021    2:13 PM 05/24/2016   10:29 AM  Depression screen PHQ 2/9  Decreased Interest 0 1 0 0 0  Down, Depressed, Hopeless 1 1 1  0 3  PHQ - 2 Score 1 2 1  0 3  Altered sleeping 2 1 1  0 2  Tired, decreased energy 1 1 0 0 3  Change in appetite 0 1 0 0 3  Feeling bad or failure about yourself  0 0 2 0 3  Trouble concentrating 1  3 0 3  Moving slowly or fidgety/restless 1 1 0 0 2  Suicidal thoughts 0 0 0 0 3  PHQ-9 Score 6 6 7  0 22      03/27/2023    4:25 PM 07/18/2022    1:43 PM 04/17/2022   11:19 AM 08/20/2021    2:13 PM  GAD 7 : Generalized Anxiety Score  Nervous, Anxious, on Edge 2 1 1  0  Control/stop worrying 1 1 0 0  Worry too much - different things 1 1 0 0  Trouble relaxing 1 1 0 0  Restless 1 1 0 0  Easily annoyed or irritable 1 1 1 1   Afraid - awful might happen 1  0 1 0  Total GAD 7 Score 8 6 3  1

## 2023-05-01 ENCOUNTER — Encounter: Payer: Self-pay | Admitting: Hematology and Oncology

## 2023-05-02 ENCOUNTER — Telehealth: Payer: Self-pay | Admitting: Clinical

## 2023-05-02 ENCOUNTER — Encounter: Payer: Self-pay | Admitting: Hematology and Oncology

## 2023-05-02 NOTE — Telephone Encounter (Signed)
Returned pt call, upset about changes with [facility], has been calling all of her medical providers to make sure everyone is still taking [employer]; so far, all of them are taking [employer]. Pt doesn't like when people look at her a certain way when she tells them she "can't read that well"; overall, frustrated and fearful of [employer] changes. There is uncertainty whether or not pt is seeing a therapist or not through [facility] (unable to get clear answer about this). Pt agrees to follow up in-person visit with [facility] and Dr. Alysia Penna on 05/12/23.

## 2023-05-05 ENCOUNTER — Encounter: Payer: Self-pay | Admitting: General Practice

## 2023-05-05 ENCOUNTER — Telehealth: Payer: Self-pay

## 2023-05-05 NOTE — Telephone Encounter (Signed)
Returned her call and left a message. She is calling to verify next injection appt. Left date and time on 7/27 at 1145. Ask her to call the office for questions.

## 2023-05-05 NOTE — Progress Notes (Signed)
[facility] Spiritual Care Note  Received call from Sundi as she continues to process grief at the loss of her boyfriend Trey Paula, ongoing UTI symptoms, and new insurance frustrations.  Provided empathic listening and emotional support. Linet calls as needed for support.   [address] [name], [place], [facility] Pager [phone] Voicemail [phone]

## 2023-05-07 ENCOUNTER — Ambulatory Visit: Payer: MEDICAID

## 2023-05-07 ENCOUNTER — Ambulatory Visit
Admission: RE | Admit: 2023-05-07 | Discharge: 2023-05-07 | Disposition: A | Discharge status: Home / Self Care | Payer: MEDICAID | Source: Ambulatory Visit | Attending: Radiation Oncology | Admitting: Radiation Oncology

## 2023-05-07 ENCOUNTER — Other Ambulatory Visit: Payer: Self-pay

## 2023-05-07 ENCOUNTER — Ambulatory Visit: Payer: Self-pay | Admitting: Urology

## 2023-05-07 DIAGNOSIS — R300 Dysuria: Secondary | ICD-10-CM | POA: Insufficient documentation

## 2023-05-07 LAB — URINALYSIS, COMPLETE (UACMP) WITH MICROSCOPIC
Bilirubin Urine: NEGATIVE
Glucose, UA: NEGATIVE mg/dL
Ketones, ur: NEGATIVE mg/dL
Nitrite: POSITIVE — AB
Protein, ur: NEGATIVE mg/dL
Specific Gravity, Urine: 1.018 (ref 1.005–1.030)
WBC, UA: >50 WBC/hpf (ref 0–5)
pH: 5 (ref 5.0–8.0)

## 2023-05-07 NOTE — Progress Notes (Deleted)
Assessment: 1. Frequent UTI     Plan: I personally reviewed the patient's chart including provider notes, lab and imaging results.   Chief Complaint: No chief complaint on file.   History of Present Illness:  Jordan [name] is a 57 y.o. female who is seen in consultation from Courtney Paris, NP for evaluation of frequent UTI's.  Urine culture results: 10/09/22 50-100K Enterococcus 10/13/22 >100K staph simulans 11/02/22 >100K E. Coli 11/22/22  >100K E. Coli, >100K Strep viridans 12/15/22 >100K Klebsiella, >100K Enterococcus 01/05/23 >100K E. Coli 01/30/23  >100K E. Coli 03/05/23 >100K E. Coli  CT abdomen pelvis with contrast from 2/24 showed a right renal cyst, no hydronephrosis, no evidence of obstruction, no obvious fistula noted.  She has previously seen Dr. Benancio Deeds at [facility].  She was seen there on 04/25/2023.  In-N-Out catheterization showed 30 mL. Urine culture grew >100 K mixed organisms.  She was treated with doxycycline.  Past Medical History:  Past Medical History:  Diagnosis Date   Alcohol abuse    Anemia    patient denies   Bipolar 1 disorder (HCC)    No medications currently   CAP (community acquired pneumonia) 03/17/2015   History of radiation therapy 08/24/19-09/22/19   Vaginal brachytherapy   Dr. 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   Mental disorder    Obesity    PICC line infection 05/17/2015   Sepsis due to Gram negative bacteria (MDR E Coli) 02/18/2015   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HCC)    UTI (lower urinary tract infection)    Vaginal Pap smear, abnormal     Past Surgical History:  Past Surgical History:  Procedure Laterality Date   ABDOMINAL HYSTERECTOMY     CERVICAL CONIZATION W/BX N/A 07/01/2016   Procedure: CONIZATION CERVIX WITH BIOPSY;  Surgeon: Hermina Staggers, MD;  Location: [facility];  Service: Gynecology;  Laterality: N/A;   CHOLECYSTECTOMY     EXTERNAL FIXATION LEG Right 04/09/2020    Procedure: EXTERNAL FIXATION LEG;  Surgeon: Bjorn Pippin, MD;  Location: [facility];  Service: Orthopedics;  Laterality: Right;   HYSTEROSCOPY WITH D & C N/A 01/25/2016   Procedure: DILATATION AND CURETTAGE /HYSTEROSCOPY;  Surgeon: Catalina Antigua, MD;  Location: [facility];  Service: Gynecology;  Laterality: N/A;   LYMPH NODE BIOPSY Bilateral 07/22/2019   Procedure: LYMPH NODE BIOPSY;  Surgeon: Adolphus Birchwood, MD;  Location: [facility];  Service: Gynecology;  Laterality: Bilateral;   OPEN REDUCTION INTERNAL FIXATION (ORIF) TIBIA/FIBULA FRACTURE Right 04/10/2020   Procedure: OPEN REDUCTION INTERNAL FIXATION (ORIF) TIBIA/FIBULA FRACTURE;  Surgeon: Roby Lofts, MD;  Location: [facility];  Service: Orthopedics;  Laterality: Right;   ROBOT ASSISTED MYOMECTOMY N/A 07/22/2019   Procedure: XI ROBOTIC ASSISTED LAPAROSCOPIC RADICAL UPPER VAGINECTOMY, LEFT SALPINECTOMY, RIGHT SALPINGOOOPHERECTOMY;  Surgeon: Adolphus Birchwood, MD;  Location: [facility];  Service: Gynecology;  Laterality: N/A;   VAGINAL HYSTERECTOMY N/A 03/11/2017   Procedure: HYSTERECTOMY VAGINAL WITH MORCELLATION;  Surgeon: Hermina Staggers, MD;  Location: [facility];  Service: Gynecology;  Laterality: N/A;    Allergies:  Allergies  Allergen Reactions   Ceftriaxone Anaphylaxis    See progess note. Administration results in hives, throat pain, flushing and SOB.   Ciprofloxacin Swelling    Lips swell, tongue swells, face swells   Citalopram Other (See Comments)    Possible cause of pancytopenia. Swelling of tongue, face and throat   Lamotrigine Other (See Comments)    Possible cause of pancytopenia. Swelling  of face, throat and tongue   Sulfa Antibiotics Anaphylaxis   Tramadol Anaphylaxis, Shortness Of Breath and Swelling    Family History:  Family History  Problem Relation Age of Onset   Alcohol abuse Father    Cancer Father    Breast cancer Neg Hx     Social History:  Social History   Tobacco Use   Smoking status: Never   Smokeless tobacco: Never  Vaping  Use   Vaping status: Never Used  Substance Use Topics   Alcohol use: Not Currently   Drug use: No    Review of symptoms:  Constitutional:  Negative for unexplained weight loss, night sweats, fever, chills ENT:  Negative for nose bleeds, sinus pain, painful swallowing CV:  Negative for chest pain, shortness of breath, exercise intolerance, palpitations, loss of consciousness Resp:  Negative for cough, wheezing, shortness of breath GI:  Negative for nausea, vomiting, diarrhea, bloody stools GU:  Positives noted in HPI; otherwise negative for gross hematuria, dysuria, urinary incontinence Neuro:  Negative for seizures, poor balance, limb weakness, slurred speech Psych:  Negative for lack of energy, depression, anxiety Endocrine:  Negative for polydipsia, polyuria, symptoms of hypoglycemia (dizziness, hunger, sweating) Hematologic:  Negative for anemia, purpura, petechia, prolonged or excessive bleeding, use of anticoagulants  Allergic:  Negative for difficulty breathing or choking as a result of exposure to anything; no shellfish allergy; no allergic response (rash/itch) to materials, foods  Physical exam: LMP  (LMP Unknown)  GENERAL APPEARANCE:  Well appearing, well developed, well nourished, NAD HEENT: Atraumatic, Normocephalic, oropharynx clear. NECK: Supple without lymphadenopathy or thyromegaly. LUNGS: Clear to auscultation bilaterally. HEART: Regular Rate and Rhythm without murmurs, gallops, or rubs. ABDOMEN: Soft, non-tender, No Masses. EXTREMITIES: Moves all extremities well.  Without clubbing, cyanosis, or edema. NEUROLOGIC:  Alert and oriented x 3, normal gait, CN II-XII grossly intact.  MENTAL STATUS:  Appropriate. BACK:  Non-tender to palpation.  No CVAT SKIN:  Warm, dry and intact.    Results: No results found. However, due to the size of the patient record, not all encounters were searched. Please check Results Review for a complete set of results.

## 2023-05-07 NOTE — Progress Notes (Signed)
In & Out Cath Performed. Sample Sent To Lab.

## 2023-05-09 LAB — URINE CULTURE

## 2023-05-10 LAB — URINE CULTURE: Culture: >=100000 — AB

## 2023-05-12 ENCOUNTER — Other Ambulatory Visit: Payer: Self-pay | Admitting: Radiation Oncology

## 2023-05-12 ENCOUNTER — Ambulatory Visit: Payer: MEDICAID | Admitting: Obstetrics and Gynecology

## 2023-05-12 ENCOUNTER — Ambulatory Visit (INDEPENDENT_AMBULATORY_CARE_PROVIDER_SITE_OTHER): Payer: MEDICAID | Admitting: Clinical

## 2023-05-12 ENCOUNTER — Encounter: Payer: Self-pay | Admitting: Obstetrics and Gynecology

## 2023-05-12 ENCOUNTER — Other Ambulatory Visit: Payer: Self-pay

## 2023-05-12 VITALS — BP 122/54 | HR 70 | Ht 68.0 in | Wt 275.9 lb

## 2023-05-12 DIAGNOSIS — F3160 Bipolar disorder, current episode mixed, unspecified: Secondary | ICD-10-CM | POA: Diagnosis not present

## 2023-05-12 DIAGNOSIS — F4381 Prolonged grief disorder: Secondary | ICD-10-CM

## 2023-05-12 DIAGNOSIS — N951 Menopausal and female climacteric states: Secondary | ICD-10-CM

## 2023-05-12 DIAGNOSIS — Z658 Other specified problems related to psychosocial circumstances: Secondary | ICD-10-CM

## 2023-05-12 MED ORDER — FOSFOMYCIN TROMETHAMINE 3 G PO PACK: 3.0000 g | PACK | Freq: Once | ORAL | 0 refills | Status: AC

## 2023-05-12 MED ORDER — ESTRADIOL 0.075 MG/24HR TD PTTW
1.0000 | MEDICATED_PATCH | TRANSDERMAL | 12 refills | Status: DC
Start: 2023-05-12 — End: 2023-07-30

## 2023-05-12 NOTE — Progress Notes (Signed)
[name] presents for follow up of her menopausal Sx. She is using Vivelle dot. Her Sx are much improved and she is tolerating the patch well Desires to continue  PE AF VSS Lungs clear  Heart RRR Abd soft + BS  A/P Menopausal Sx  Stable Continue with Vivelle dot. Rx to pharmacy F/U in 6 months

## 2023-05-13 ENCOUNTER — Other Ambulatory Visit: Payer: Self-pay

## 2023-05-13 ENCOUNTER — Telehealth: Payer: Self-pay | Admitting: Family Medicine

## 2023-05-13 NOTE — BH Specialist Note (Unsigned)
Integrated Behavioral Health Follow Up In-Person Visit  MRN: [id number] Name: Jordan [name]  Number of Integrated Behavioral Health Clinician visits: Additional Visit  Session Start time: 1549   Session End time: 1645  Total time in minutes: 56   Types of Service: Individual psychotherapy  Interpretor:No. Interpretor Name and Language: n/a  Subjective: Jordan [name] is a 57 y.o. female accompanied by  n/a Patient was referred by Nettie Elm for depression screening. Patient reports the following symptoms/concerns: Frustration over attempts to get in touch with her therapist she saw for years, along with feeling sad at upcoming 57th birthday without her partner.  Duration of problem: Ongoing; Severity of problem: moderate  Objective: Mood: Dysphoric and Affect: Appropriate Risk of harm to self or others: No plan to harm self or others  Life Context: Family and Social: Lives by herself; grown daughter lives out-of[place]state School/Work: Disability Life changes: Loss of partner over one year ago and continued confusion and frustration with insurance changes  Self-care: attends medical appointments  Patient and/or Family's Strengths/Protective Factors: Social connections, Concrete supports in place (healthy food, safe environments, etc.), and Sense of purpose  Goals Addressed: Patient will:  Reduce symptoms of: agitation, anxiety, depression, mood instability, and stress    Demonstrate ability to: Increase motivation to adhere to plan of care  Progress towards Goals: Ongoing  Interventions: Interventions utilized:  Motivational Interviewing Standardized Assessments completed: Not Needed  Patient and/or Family Response: Patient agrees with treatment plan.   Patient Centered Plan: Patient is on the following Treatment Plan(s): IBH Assessment: Patient currently experiencing Bipolar affective disorder, mixed; Persistent grief disorder; Psychosocial stress  Patient may  benefit from continued therapeutic intervention; establishing with previous ongoing therapist  Plan: Follow up with behavioral health clinician on : Two weeks Behavioral recommendations:  -Continue attending scheduled medical appointments and follow medical recommendations -Continue maintaining communication with daughter and supportive people in life -Consider finding ways to celebrate self on birthday coming up; reach out to others that day Referral(s): [facility] [employer] [facility] (In Clinic)  [name] [place], [place]     03/27/2023    4:22 PM 07/18/2022    1:43 PM 04/17/2022   11:19 AM 08/20/2021    2:13 PM 05/24/2016   10:29 AM  Depression screen PHQ 2/9  Decreased Interest 0 1 0 0 0  Down, Depressed, Hopeless 1 1 1  0 3  PHQ - 2 Score 1 2 1  0 3  Altered sleeping 2 1 1  0 2  Tired, decreased energy 1 1 0 0 3  Change in appetite 0 1 0 0 3  Feeling bad or failure about yourself  0 0 2 0 3  Trouble concentrating 1  3 0 3  Moving slowly or fidgety/restless 1 1 0 0 2  Suicidal thoughts 0 0 0 0 3  PHQ-9 Score 6 6 7  0 22      03/27/2023    4:25 PM 07/18/2022    1:43 PM 04/17/2022   11:19 AM 08/20/2021    2:13 PM  GAD 7 : Generalized Anxiety Score  Nervous, Anxious, on Edge 2 1 1  0  Control/stop worrying 1 1 0 0  Worry too much - different things 1 1 0 0  Trouble relaxing 1 1 0 0  Restless 1 1 0 0  Easily annoyed or irritable 1 1 1 1   Afraid - awful might happen 1 0 1 0  Total GAD 7 Score 8 6 3  1

## 2023-05-13 NOTE — Telephone Encounter (Signed)
Patient called in stating she needs to speak with a doctor regarding a recurrent UTI.

## 2023-05-14 NOTE — Telephone Encounter (Signed)
Per chart review patient was seen in the office on 7/22. She was seen by [facility] with a urine culture performed on 7/17 was + for UTI. Dr. Roselind Messier sent in ATB prescription on 7/22 for treatment.   Returned call to patient. Patient did not answer. LM on patient identified voicemail for patient to call the office at her convenience with further questions. LM that a prescription was sent to her pharmacy by Dr. Roselind Messier for treatment of current UTI and may want to reach out to them with further questions or concerns.

## 2023-05-16 ENCOUNTER — Inpatient Hospital Stay: Payer: MEDICAID

## 2023-05-17 ENCOUNTER — Inpatient Hospital Stay: Payer: MEDICAID | Attending: Radiation Oncology

## 2023-05-17 VITALS — BP 112/71 | HR 86 | Temp 98.2°F | Resp 18

## 2023-05-17 DIAGNOSIS — E538 Deficiency of other specified B group vitamins: Secondary | ICD-10-CM | POA: Diagnosis present

## 2023-05-17 MED ORDER — CYANOCOBALAMIN 1000 MCG/ML IJ SOLN
1000.0000 ug | Freq: Once | INTRAMUSCULAR | Status: AC
  Administered 2023-05-17: 1000 ug via INTRAMUSCULAR

## 2023-05-17 NOTE — Patient Instructions (Signed)
Vitamin B12 Injection What is this medication? Vitamin B12 (VAHY tuh min B12) prevents and treats low vitamin B12 levels in your body. It is used in people who do not get enough vitamin B12 from their diet or when their digestive tract does not absorb enough. Vitamin B12 plays an important role in maintaining the health of your nervous system and red blood cells. This medicine may be used for other purposes; ask your health care provider or pharmacist if you have questions. COMMON BRAND NAME(S): B-12 Compliance Kit, B-12 Injection Kit, Cyomin, Dodex, LA-12, Nutri-Twelve, Physicians EZ Use B-12, Primabalt, Vitamin Deficiency Injectable System - B12 What should I tell my care team before I take this medication? They need to know if you have any of these conditions: Kidney disease Leber's disease Megaloblastic anemia An unusual or allergic reaction to cyanocobalamin, cobalt, other medications, foods, dyes, or preservatives Pregnant or trying to get pregnant Breast-feeding How should I use this medication? This medication is injected into a muscle or deeply under the skin. It is usually given in a clinic or care team's office. However, your care team may teach you how to inject yourself. Follow all instructions. Talk to your care team about the use of this medication in children. Special care may be needed. Overdosage: If you think you have taken too much of this medicine contact a poison control center or emergency room at once. NOTE: This medicine is only for you. Do not share this medicine with others. What if I miss a dose? If you are given your dose at a clinic or care team's office, call to reschedule your appointment. If you give your own injections, and you miss a dose, take it as soon as you can. If it is almost time for your next dose, take only that dose. Do not take double or extra doses. What may interact with this medication? Alcohol Colchicine This list may not describe all possible  interactions. Give your health care provider a list of all the medicines, herbs, non-prescription drugs, or dietary supplements you use. Also tell them if you smoke, drink alcohol, or use illegal drugs. Some items may interact with your medicine. What should I watch for while using this medication? Visit your care team regularly. You may need blood work done while you are taking this medication. You may need to follow a special diet. Talk to your care team. Limit your alcohol intake and avoid smoking to get the best benefit. What side effects may I notice from receiving this medication? Side effects that you should report to your care team as soon as possible: Allergic reactions--skin rash, itching, hives, swelling of the face, lips, tongue, or throat Swelling of the ankles, hands, or feet Trouble breathing Side effects that usually do not require medical attention (report to your care team if they continue or are bothersome): Diarrhea This list may not describe all possible side effects. Call your doctor for medical advice about side effects. You may report side effects to FDA at 1-800-FDA-1088. Where should I keep my medication? Keep out of the reach of children. Store at room temperature between 15 and 30 degrees C (59 and 85 degrees F). Protect from light. Throw away any unused medication after the expiration date. NOTE: This sheet is a summary. It may not cover all possible information. If you have questions about this medicine, talk to your doctor, pharmacist, or health care provider.  2024 Elsevier/Gold Standard (2021-06-19 00:00:00)

## 2023-05-19 ENCOUNTER — Telehealth: Payer: Self-pay

## 2023-05-19 DIAGNOSIS — N3090 Cystitis, unspecified without hematuria: Secondary | ICD-10-CM

## 2023-05-20 ENCOUNTER — Inpatient Hospital Stay: Payer: MEDICAID

## 2023-05-20 NOTE — Telephone Encounter (Signed)
Referral placed to Urogyn per recommendation of Dr. Alysia Penna.  They will call the pt with appt.   Jordan [name]  05/20/23

## 2023-05-22 ENCOUNTER — Telehealth: Payer: Self-pay | Admitting: Family Medicine

## 2023-05-22 ENCOUNTER — Ambulatory Visit: Payer: MEDICAID | Admitting: Clinical

## 2023-05-22 DIAGNOSIS — F4381 Prolonged grief disorder: Secondary | ICD-10-CM

## 2023-05-22 DIAGNOSIS — N3090 Cystitis, unspecified without hematuria: Secondary | ICD-10-CM

## 2023-05-22 DIAGNOSIS — F3160 Bipolar disorder, current episode mixed, unspecified: Secondary | ICD-10-CM

## 2023-05-22 DIAGNOSIS — Z658 Other specified problems related to psychosocial circumstances: Secondary | ICD-10-CM

## 2023-05-22 NOTE — Telephone Encounter (Signed)
Patient is trying to get referral to urogynecology, spoke to Dr. Alysia Penna about it in last visit

## 2023-05-26 ENCOUNTER — Encounter: Payer: Self-pay | Admitting: Family Medicine

## 2023-05-26 ENCOUNTER — Telehealth: Payer: Self-pay

## 2023-05-26 NOTE — BH Specialist Note (Signed)
Integrated Behavioral Health Follow Up In-Person Visit  MRN: [id number] Name: Jordan [name]  Number of Integrated Behavioral Health Clinician visits: Additional Visit  Session Start time: 1555   Session End time: 1650  Total time in minutes: 55   Types of Service: Individual psychotherapy  Interpretor:No. Interpretor Name and Language: n/a  Subjective: Jordan [name] is a 57 y.o. female accompanied by  n/a Patient was referred by Nettie Elm, MD for positive depression screening. Patient reports the following symptoms/concerns: Irritability and stress over housing and neighbor issues (conflict with neighbors; leak in kitchen coming from upstairs neighbors bathroom; worry over landlord mentioning possibility of selling house).  Duration of problem: Ongoing; Severity of problem: moderate  Objective: Mood: Dysphoric and Affect: Appropriate Risk of harm to self or others: No plan to harm self or others  Life Context: Family and Social: Lives by herself; grown daughter lives in [place] School/Work: On disability Self-Care: Attends all scheduled medical and behavioral health appointments; advocates for health care Life Changes: Loss of partner over one year ago; ongoing neighbor conflict  Patient and/or Family's Strengths/Protective Factors: Social connections, Concrete supports in place (healthy food, safe environments, etc.), and Sense of purpose  Goals Addressed: Patient will:  Reduce symptoms of: agitation, anxiety, depression, mood instability, and stress   Increase knowledge and/or ability of: stress reduction   Demonstrate ability to: Increase motivation to adhere to plan of care  Progress towards Goals: Ongoing  Interventions: Interventions utilized:  Motivational Interviewing and Supportive Reflection Standardized Assessments completed: Not Needed  Patient and/or Family Response: Patient agrees with treatment plan.   Patient Centered Plan: Patient is on the  following Treatment Plan(s): IBH Assessment: Patient currently experiencing Bipolar affective disorder, Prolonged grief disorder; Psychosocial stress  Patient may benefit from continued therapeutic intervention .   Plan: Follow up with behavioral health clinician on : Two weeks Behavioral recommendations:  -Continue attending scheduled medical appointments and follow medical recommendations -Continue maintaining communication with daughter and supportive people in life -Continue being extra careful walking into and out of the kitchen; limit time spent in kitchen due to leak until hole in ceiling is completely repaired with no leakage; let landlord know immediately of any further issues Referral(s): [facility] [employer] [facility] (In Clinic)  [name] [place], [place]     03/27/2023    4:22 PM 07/18/2022    1:43 PM 04/17/2022   11:19 AM 08/20/2021    2:13 PM 05/24/2016   10:29 AM  Depression screen PHQ 2/9  Decreased Interest 0 1 0 0 0  Down, Depressed, Hopeless 1 1 1  0 3  PHQ - 2 Score 1 2 1  0 3  Altered sleeping 2 1 1  0 2  Tired, decreased energy 1 1 0 0 3  Change in appetite 0 1 0 0 3  Feeling bad or failure about yourself  0 0 2 0 3  Trouble concentrating 1  3 0 3  Moving slowly or fidgety/restless 1 1 0 0 2  Suicidal thoughts 0 0 0 0 3  PHQ-9 Score 6 6 7  0 22      03/27/2023    4:25 PM 07/18/2022    1:43 PM 04/17/2022   11:19 AM 08/20/2021    2:13 PM  GAD 7 : Generalized Anxiety Score  Nervous, Anxious, on Edge 2 1 1  0  Control/stop worrying 1 1 0 0  Worry too much - different things 1 1 0 0  Trouble relaxing 1 1 0 0  Restless 1 1  0 0  Easily annoyed or irritable 1 1 1 1   Afraid - awful might happen 1 0 1 0  Total GAD 7 Score 8 6 3  1

## 2023-05-26 NOTE — Telephone Encounter (Signed)
Returned her call. Reviewed upcoming appt date and time. She verbalized understanding and is aware of appt.

## 2023-05-27 NOTE — Telephone Encounter (Signed)
Reordered referral via Urogynecology.

## 2023-05-29 NOTE — Telephone Encounter (Signed)
I called Sonita and informed her Dr. Alysia Penna approved referral to Urogynecology and we have sent the referral. I explained they are a bit behind because provider was out but they should call her within a few weeks to schedule appointment and she should call us back if she does not hear from them within 2-3 weeks. She voices understanding. [name]

## 2023-05-30 ENCOUNTER — Telehealth: Payer: Self-pay | Admitting: *Deleted

## 2023-05-30 NOTE — Telephone Encounter (Signed)
Contacted patient with response to questions on VM: What is risk of coming sooner to get injection and would getting a patch for menopause affect getting her injection? Dr. Bertis Ruddy informed of questions. Per Dr. Bertis Ruddy - Advised her that injections for B12 are scheduled a month apart for best effect and that insurance may not cover them closer than a month since they are ordered to be a month apart. Getting a patch will not affect receiving the injection. Patient verbalized understanding.

## 2023-06-02 ENCOUNTER — Encounter: Payer: Self-pay | Admitting: General Practice
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
## 2023-06-05 ENCOUNTER — Encounter: Payer: Self-pay | Admitting: Hematology and Oncology

## 2023-06-06 ENCOUNTER — Encounter: Payer: Self-pay | Admitting: Hematology and Oncology

## 2023-06-09 ENCOUNTER — Ambulatory Visit (INDEPENDENT_AMBULATORY_CARE_PROVIDER_SITE_OTHER): Payer: MEDICAID | Admitting: Clinical

## 2023-06-09 DIAGNOSIS — F3160 Bipolar disorder, current episode mixed, unspecified: Secondary | ICD-10-CM | POA: Diagnosis not present

## 2023-06-09 DIAGNOSIS — F4381 Prolonged grief disorder: Secondary | ICD-10-CM

## 2023-06-09 DIAGNOSIS — Z658 Other specified problems related to psychosocial circumstances: Secondary | ICD-10-CM

## 2023-06-09 NOTE — Patient Instructions (Signed)
[facility] [address] [place], [place] [address] [phone] (main office) [phone] (Jamie's office)   

## 2023-06-10 ENCOUNTER — Ambulatory Visit: Payer: MEDICAID

## 2023-06-10 ENCOUNTER — Ambulatory Visit: Payer: Self-pay | Admitting: Radiation Oncology

## 2023-06-10 ENCOUNTER — Telehealth: Payer: Self-pay

## 2023-06-10 NOTE — Telephone Encounter (Signed)
Patient called in to report that she is having severe vaginal odor,burning, and dizziness. Patient states, " The odor is 10 times worse than its ever been. I can't take this anymore." Patient asking if she can come in for a UA C&S. Patient due to see GYN Urology next week. Please advise.

## 2023-06-11 ENCOUNTER — Telehealth: Payer: Self-pay | Admitting: *Deleted

## 2023-06-11 ENCOUNTER — Inpatient Hospital Stay: Payer: MEDICAID | Attending: Radiation Oncology

## 2023-06-11 ENCOUNTER — Ambulatory Visit: Payer: MEDICAID

## 2023-06-11 ENCOUNTER — Ambulatory Visit: Payer: MEDICAID | Attending: Radiation Oncology

## 2023-06-11 ENCOUNTER — Telehealth: Payer: Self-pay

## 2023-06-11 DIAGNOSIS — E538 Deficiency of other specified B group vitamins: Secondary | ICD-10-CM | POA: Insufficient documentation

## 2023-06-11 NOTE — Telephone Encounter (Signed)
CALLED PATIENT TO ASK ABOUT RESCHEDULING TODAY 'S LAB AND FU, PATIENT AGREED TO COME  ON 06-13-23, APPTS. HAVE BEEN BOOKED

## 2023-06-13 ENCOUNTER — Ambulatory Visit: Payer: MEDICAID

## 2023-06-13 ENCOUNTER — Telehealth: Payer: Self-pay

## 2023-06-13 ENCOUNTER — Inpatient Hospital Stay: Payer: MEDICAID

## 2023-06-13 NOTE — Telephone Encounter (Signed)
Patient called in to cancel today's appointment. Per patient she does not feel well. Patient to call when she wants to reschedule.

## 2023-06-13 NOTE — Telephone Encounter (Signed)
She called to reschedule injection appt to Monday at 2:15 pm. She has a sinus headache and does not feel like coming in today for injection. She is aware of appt on 8/26.

## 2023-06-16 ENCOUNTER — Inpatient Hospital Stay: Payer: MEDICAID

## 2023-06-16 DIAGNOSIS — E538 Deficiency of other specified B group vitamins: Secondary | ICD-10-CM

## 2023-06-16 MED ORDER — CYANOCOBALAMIN 1000 MCG/ML IJ SOLN
1000.0000 ug | Freq: Once | INTRAMUSCULAR | Status: AC
  Administered 2023-06-16: 1000 ug via INTRAMUSCULAR
  Filled 2023-06-16: qty 1
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
## 2023-06-17 ENCOUNTER — Inpatient Hospital Stay: Payer: MEDICAID

## 2023-06-19 ENCOUNTER — Encounter: Payer: Self-pay | Admitting: Obstetrics and Gynecology

## 2023-06-19 ENCOUNTER — Ambulatory Visit: Payer: MEDICAID | Admitting: Obstetrics and Gynecology

## 2023-06-19 ENCOUNTER — Other Ambulatory Visit (HOSPITAL_COMMUNITY)
Admission: RE | Admit: 2023-06-19 | Discharge: 2023-06-19 | Disposition: A | Discharge status: Home / Self Care | Payer: MEDICAID | Source: Other Acute Inpatient Hospital | Attending: Obstetrics and Gynecology | Admitting: Obstetrics and Gynecology

## 2023-06-19 VITALS — BP 150/84 | HR 61 | Ht 67.84 in | Wt 273.6 lb

## 2023-06-19 DIAGNOSIS — M62838 Other muscle spasm: Secondary | ICD-10-CM

## 2023-06-19 DIAGNOSIS — R152 Fecal urgency: Secondary | ICD-10-CM

## 2023-06-19 DIAGNOSIS — R82998 Other abnormal findings in urine: Secondary | ICD-10-CM | POA: Diagnosis not present

## 2023-06-19 DIAGNOSIS — N3281 Overactive bladder: Secondary | ICD-10-CM

## 2023-06-19 DIAGNOSIS — R159 Full incontinence of feces: Secondary | ICD-10-CM

## 2023-06-19 DIAGNOSIS — R350 Frequency of micturition: Secondary | ICD-10-CM | POA: Diagnosis not present

## 2023-06-19 DIAGNOSIS — N390 Urinary tract infection, site not specified: Secondary | ICD-10-CM | POA: Diagnosis not present

## 2023-06-19 DIAGNOSIS — N952 Postmenopausal atrophic vaginitis: Secondary | ICD-10-CM | POA: Diagnosis not present

## 2023-06-19 LAB — POCT URINALYSIS DIPSTICK
Glucose, UA: NEGATIVE
Ketones, UA: NEGATIVE
Nitrite, UA: POSITIVE
Protein, UA: POSITIVE — AB
Spec Grav, UA: >=1.03 — AB (ref 1.010–1.025)
Urobilinogen, UA: 4 E.U./dL — AB
pH, UA: 6 (ref 5.0–8.0)

## 2023-06-19 MED ORDER — ESTROGENS CONJUGATED 0.625 MG/GM VA CREA
1.0000 | TOPICAL_CREAM | VAGINAL | 5 refills | Status: DC
Start: 2023-06-19 — End: 2023-09-13

## 2023-06-19 MED ORDER — NITROFURANTOIN MONOHYD MACRO 100 MG PO CAPS
100.0000 mg | ORAL_CAPSULE | Freq: Every day | ORAL | 5 refills | Status: DC
Start: 2023-06-19 — End: 2023-07-04

## 2023-06-19 NOTE — Progress Notes (Signed)
Jordan [name]  Referring Provider: Hermina Staggers, MD PCP: Courtney Paris, NP Date of Service: 06/19/2023  SUBJECTIVE Chief Complaint: New Patient (Initial Visit) Jordan [name] is a 57 y.o. female is for cystitis.)  History of Present Illness: Jordan [name] is a 57 y.o. White or Caucasian female seen in [name] at the request of Dr. Alysia Penna for evaluation of cystitis.    Review of records significant for: Urine cultures:  05/07/23: +E.coli and Klebsiella 03/03/23: +E.coli 01/27/23: + E.coli 01/03/23: + E.coli >100,000 colonies 12/12/22: +Klebsiella Pneumonia and Enterococcus >100,000 11/20/22: + E. Coli 100,000 10/31/22: +E.coli >100,000 10/11/22: + Staphylococcus Simulans 10/03/22: +Enterococcus species and Staphylococcus 09/25/22: No Growth 09/17/22: + E.coli >100,000 Colonies        + Aerococcus Species  CT Abdomen Pelvis w/ Contrast 12/10/22  Adrenals/Urinary Tract: Normal adrenals. Simple 3.4 cm medial upper right renal cyst, for which no follow-up imaging is recommended. No suspicious renal masses. No hydronephrosis. Symmetric normal contrast nephrograms. Normal bladder with no definite bladder wall thickening. No bladder gas. No bladder stones. No evidence of a fistula involving the bladder. The urethra is not well evaluated by CT, with no gross urethral abnormality noted.  Urinary Symptoms: Leaks urine with cough/ sneeze, laughing, lifting, and going from sitting to standing Leaks 5-6 time(s) per days.  Pad use: 6 pads per day.   She is bothered by her UI symptoms.  Day time voids 5-6.  Nocturia: 5-6 times per night to void. Voiding dysfunction: she empties her bladder well.  does not use a catheter to empty bladder.  When urinating, she feels difficulty starting urine stream and the need to urinate multiple times in a row Drinks: 60oz Water, 16-48oz Soda per day  UTIs: 10 UTI's in the last year.   Reports  history of blood in urine  Pelvic Organ Prolapse Symptoms:                  She Denies a feeling of a bulge the vaginal area.   Bowel Symptom: Bowel movements: 1 to 2 time(s) per day Stool consistency: soft  Straining: no.  Splinting: no.  Incomplete evacuation: no.  She Admits to accidental bowel leakage / fecal incontinence Bowel regimen: none Last colonoscopy: Never  Sexual Function Sexually active: no.  Sexual orientation: Straight Pain with sex: No  Pelvic Pain Admits to pelvic pain Location: Middle pelvis Improved by: laying in bed    Past Medical History:  Past Medical History:  Diagnosis Date   Alcohol abuse    Anemia    patient denies   Bipolar 1 disorder (HCC)    No medications currently   CAP (community acquired pneumonia) 03/17/2015   History of radiation therapy 08/24/19-09/22/19   Vaginal brachytherapy   Dr. 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   Mental disorder    Obesity    PICC line infection 05/17/2015   Sepsis due to Gram negative bacteria (MDR E Coli) 02/18/2015   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HCC)    UTI (lower urinary tract infection)    Vaginal Pap smear, abnormal      Past Surgical History:   Past Surgical History:  Procedure Laterality Date   ABDOMINAL HYSTERECTOMY     CERVICAL CONIZATION W/BX N/A 07/01/2016   Procedure: CONIZATION CERVIX WITH BIOPSY;  Surgeon: Hermina Staggers, MD;  Location: [facility];  Service: Gynecology;  Laterality: N/A;   CHOLECYSTECTOMY  EXTERNAL FIXATION LEG Right 04/09/2020   Procedure: EXTERNAL FIXATION LEG;  Surgeon: Bjorn Pippin, MD;  Location: [facility];  Service: Orthopedics;  Laterality: Right;   HYSTEROSCOPY WITH D & C N/A 01/25/2016   Procedure: DILATATION AND CURETTAGE /HYSTEROSCOPY;  Surgeon: Catalina Antigua, MD;  Location: [facility];  Service: Gynecology;  Laterality: N/A;   LYMPH NODE BIOPSY Bilateral 07/22/2019   Procedure: LYMPH NODE BIOPSY;  Surgeon: Adolphus Birchwood,  MD;  Location: [facility];  Service: Gynecology;  Laterality: Bilateral;   OPEN REDUCTION INTERNAL FIXATION (ORIF) TIBIA/FIBULA FRACTURE Right 04/10/2020   Procedure: OPEN REDUCTION INTERNAL FIXATION (ORIF) TIBIA/FIBULA FRACTURE;  Surgeon: Roby Lofts, MD;  Location: [facility];  Service: Orthopedics;  Laterality: Right;   ROBOT ASSISTED MYOMECTOMY N/A 07/22/2019   Procedure: XI ROBOTIC ASSISTED LAPAROSCOPIC RADICAL UPPER VAGINECTOMY, LEFT SALPINECTOMY, RIGHT SALPINGOOOPHERECTOMY;  Surgeon: Adolphus Birchwood, MD;  Location: [facility];  Service: Gynecology;  Laterality: N/A;   VAGINAL HYSTERECTOMY N/A 03/11/2017   Procedure: HYSTERECTOMY VAGINAL WITH MORCELLATION;  Surgeon: Hermina Staggers, MD;  Location: [facility];  Service: Gynecology;  Laterality: N/A;     Past OB/GYN History: G0 Menopausal: Yes Contraception: Hysterectomy . Any history of abnormal pap smears: yes.   Medications: She has a current medication list which includes the following prescription(s): conjugated estrogens, estradiol, gabapentin, naproxen, nitrofurantoin (macrocrystal-monohydrate), and oxycodone hcl, and the following Facility-Administered Medications: sodium chloride.   Allergies: Patient is allergic to ceftriaxone, ciprofloxacin, citalopram, lamotrigine, sulfa antibiotics, and tramadol.   Social History:  Social History   Tobacco Use   Smoking status: Never   Smokeless tobacco: Never  Vaping Use   Vaping status: Never Used  Substance Use Topics   Alcohol use: Not Currently   Drug use: No    Relationship status: single She lives with alone.   She is not employed . Regular exercise: No History of abuse: No  Family History:   Family History  Problem Relation Age of Onset   Alcohol abuse Father    Cancer Father    Breast cancer Neg Hx      Review of Systems: Review of Systems  Constitutional:  Negative for chills, fever and malaise/fatigue.  Respiratory:  Negative for cough, shortness of breath and wheezing.    Cardiovascular:  Negative for chest pain, palpitations and leg swelling.  Gastrointestinal:  Negative for abdominal pain and blood in stool.  Genitourinary:  Positive for dysuria.  Neurological:  Positive for dizziness. Negative for seizures and headaches.  Endo/Heme/Allergies:  Does not bruise/bleed easily.  Psychiatric/Behavioral:  Negative for depression and suicidal ideas. The patient is nervous/anxious.      OBJECTIVE Physical Exam: Vitals:   06/19/23 1404 06/19/23 1536  BP: (!) 143/80 (!) 150/84  Pulse: 61   Weight: 273 lb 9.6 oz (124.1 kg)   Height: 5' 7.84" (1.723 m)     Physical Exam Vitals reviewed. Exam conducted with a chaperone present.  Constitutional:      Appearance: She is obese.  Neurological:     Mental Status: She is alert and oriented to person, place, and time.  Psychiatric:        Behavior: Behavior is agitated.        Cognition and Memory: Memory is impaired.        Judgment: Judgment is impulsive.      GU / Detailed Urogynecologic Evaluation:  Pelvic Exam: Normal external female genitalia; Bartholin's and Skene's glands normal in appearance; urethral meatus normal in appearance, no urethral masses or discharge.  CST: negative   s/p hysterectomy: Speculum exam reveals normal vaginal mucosa with  atrophy and normal vaginal cuff.  Adnexa normal adnexa.    With apex supported, anterior compartment defect was reduced  Pelvic floor strength I/V  Pelvic floor musculature: Right levator tender, Right obturator tender, Left levator tender, Left obturator tender  POP-Q:   POP-Q  -2.5                                            Aa   -2.5                                           Ba  -4                                              C   3                                            Gh  4                                            Pb  5.5                                            tvl   -3                                            Ap  -3                                             Bp                                                 D      Rectal Exam:  Normal external vaginal exam  Post-Void Residual (PVR) by Bladder Scan: In order to evaluate bladder emptying, we discussed obtaining a postvoid residual and she agreed to this procedure.  Procedure: The ultrasound unit was placed on the patient's abdomen in the suprapubic region after the patient had voided. A PVR of 27 ml was obtained by bladder scan.  Laboratory Results: POC Urine (Straight Cath): Trace blood, protein, urobilinogen, and small leukocytes  ASSESSMENT AND PLAN Jordan [name] is a 57 y.o. with:  1. Recurrent UTI   2. Leukocytes in urine   3. Vaginal atrophy   4. Urinary frequency   5. OAB (overactive bladder)  6. Levator spasm   7. Incontinence of feces with fecal urgency    Patient has had multiple recurrent UTI's in the past year as listed above. Most prevalently is E.coli. This is most likely related to her FI and vaginal atrophy following radiation to her vagina. Will plan to start her on UTI prophylaxis with Macrobid 100mg  taken daily to assist in controlling her recurrent UTIs.  Straight cath urine sample positive for Trace Blood, positive protein and small leukocytes. Will send for culture.  Patient has vaginal atrophy, likely related to her prior radiation to the area. Will start her on vaginal estrogen. She is to place the cream nightly for two weeks and then twice weekly after.  Patient has significant  symptoms of overactive bladder and frequency. She has tried medications previously. Are requesting records from [facility] so we can see what medications she has tried previously.  We discussed the symptoms of overactive bladder (OAB), which include urinary urgency, urinary frequency, nocturia, with or without urge incontinence.  While we do not know the exact etiology of OAB, several treatment options exist. We discussed management including  behavioral therapy (decreasing bladder irritants, urge suppression strategies, timed voids, bladder retraining), physical therapy, medication. She would like to start with pelvic floor PT. May consider adding medication at next visit but did not want to overwhelm patient with too many things all at once.  Patient had significant pelvic floor muscle pain on exam. Pelvic floor PT will be helpful with this.  Patient is describing problems with incontinence of bowel. She may tentatively be a candidate for SNM, but I worry about her ability to undergo procedure. Will see how she improved with pelvic floor PT. May also consider medications in the anticholinergic family which may help in retaining her stools.   Patient to follow up in 6 weeks. She would like to meet one of the physicians as she reports she does not have a problem with nurse practitioners but likes to be established with a physician due to the complexity of her care.   Selmer Dominion, NP

## 2023-06-19 NOTE — Patient Instructions (Addendum)
Today we talked about ways to manage bladder urgency such as altering your diet to avoid irritative beverages and foods (bladder diet) as well as attempting to decrease stress and other exacerbating factors.    There is a website with helpful information for people with bladder irritation, called the IC Network at https://www.ic-network.com. This website has more information about a healthy bladder diet and patient forums for support.  The Most Bothersome Foods* The Least Bothersome Foods*  Coffee - Regular & Decaf Tea - caffeinated Carbonated beverages - cola, non-colas, diet & caffeine-free Alcohols - Beer, Red Wine, White Wine, [address] - Grapefruit, Hendricks, Orange, [employer] - Cranberry, Grapefruit, Orange, Pineapple Vegetables - Tomato & Tomato Products Flavor Enhancers - Hot peppers, Spicy foods, Chili, Horseradish, Vinegar, Monosodium glutamate (MSG) Artificial Sweeteners - NutraSweet, Sweet 'N Low, Equal (sweetener), Saccharin Ethnic foods - [place], [place], [place] food [employer] - low-fat & whole Fruits - Bananas, Blueberries, Honeydew melon, Pears, Raisins, Watermelon Vegetables - Broccoli, [address] Sprouts, [place], Carrots, Cauliflower, [place], Cucumber, Mushrooms, Peas, Radishes, Squash, Zucchini, White potatoes, Sweet potatoes & yams Poultry - Chicken, Eggs, [place], [employer] - Beef, Diplomatic Services operational officer, Lamb Seafood - Shrimp, [place] fish, Salmon Grains - Oat, Rice Snacks - Pretzels, Popcorn  *Lenward Chancellor et al. Diet and its role in interstitial cystitis/bladder pain syndrome (IC/BPS) and comorbid conditions. [employer]. BJU Int. 2012 Jan 11.   Please start the estrogen cream nightly for 2 weeks. After that do it twice a week for maintenance.   Start using the prophylactic antibiotic daily for the next 6 months.

## 2023-06-21 LAB — URINE CULTURE: Culture: >=100000 — AB

## 2023-06-25 ENCOUNTER — Telehealth: Payer: Self-pay | Admitting: Obstetrics and Gynecology

## 2023-06-25 ENCOUNTER — Encounter: Payer: Self-pay | Admitting: Physical Therapy

## 2023-06-25 MED ORDER — FOSFOMYCIN TROMETHAMINE 3 G PO PACK: 3.0000 g | PACK | Freq: Once | ORAL | 0 refills | Status: AC

## 2023-06-25 NOTE — Telephone Encounter (Signed)
Called and left a message with patient about her urine culture results being of the Klebsiella species. She was started on antibiotics at her last visit, but it is listed as intermediate coverage. Will plan for coverage with a dose of Fosfomycin as well due to her allergies and will plan for her to continue the Macrobid. If this is not working for patient can consider doing Q10 day doses of Fosfomycin.

## 2023-06-26 ENCOUNTER — Ambulatory Visit: Payer: MEDICAID | Admitting: Clinical

## 2023-06-26 DIAGNOSIS — F3160 Bipolar disorder, current episode mixed, unspecified: Secondary | ICD-10-CM

## 2023-06-26 DIAGNOSIS — F4381 Prolonged grief disorder: Secondary | ICD-10-CM

## 2023-06-26 DIAGNOSIS — Z658 Other specified problems related to psychosocial circumstances: Secondary | ICD-10-CM

## 2023-06-27 ENCOUNTER — Encounter: Payer: Self-pay | Admitting: General Practice

## 2023-06-27 NOTE — Progress Notes (Signed)
[facility] Spiritual Care Note  Received and returned voicemail from [place].   Chaplain Lisa Lundeen, MDiv, [facility] Pager [phone] Voicemail [phone] 

## 2023-06-30 ENCOUNTER — Other Ambulatory Visit (HOSPITAL_COMMUNITY): Payer: Self-pay

## 2023-07-03 NOTE — BH Specialist Note (Signed)
Integrated Behavioral Health Follow Up In-Person Visit  MRN: [id number] Name: Jordan [name]  Number of Integrated Behavioral Health Clinician visits: Additional Visit  Session Start time: 1620   Session End time: 1719  Total time in minutes: 59   Types of Service: Individual psychotherapy  Interpretor:No. Interpretor Name and Language: n/a  Subjective: Jordan [name] is a 57 y.o. female accompanied by  n/a Patient was referred by Nettie Elm, MD for positive depression screen. Patient reports the following symptoms/concerns: Stress over setting up new phone with low literacy, ongoing conflict with neighbor, political climate stress; attending PSR Program on days with no medical appointments.  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 Life Changes: Ongoing conflict with neighbor; loss of partner over one year  Patient and/or Family's Strengths/Protective Factors: Social connections, Concrete supports in place (healthy food, safe environments, etc.), and Sense of purpose  Goals Addressed: Patient will:  Reduce symptoms of: agitation, anxiety, depression, mood instability, and stress   Increase knowledge and/or ability of: stress reduction   Demonstrate ability to: Increase motivation to adhere to plan of care  Progress towards Goals: Ongoing  Interventions: Interventions utilized:  Supportive Reflection Standardized Assessments completed: GAD-7 and PHQ 9  Patient and/or Family Response: Patient agrees with treatment plan.   Patient Centered Plan: Patient is on the following Treatment Plan(s): IBH Assessment:;  Patient  currently experiencing Bipolar affective disorder; Prolonged grief disorder; Psychosocial stress; Illiteracy  Patient may benefit from continued therapeutic intervention  .  Plan: Follow up with behavioral health clinician on : Three weeks Behavioral recommendations:  -Continue plan to attend all upcoming medical appointments -Continue maintaining connection with daughter and landlord/supportive people in life; maintaining distance from stressful people in life -Continue attending daily PSR (Psychosocial Rehabiliation) Program via [facility]; continue plan to establish with ongoing therapist as soon as initial appointment is available -Contact Support Help Desk number for support setting up new phone; ask friend who gave to you if further help is needed  Referral(s): Integrated [employer] [facility] (In Clinic)  [name] [place], [place]     07/16/2023    5:18 PM 03/27/2023    4:22 PM 07/18/2022    1:43 PM 04/17/2022   11:19 AM 08/20/2021    2:13 PM  Depression screen PHQ 2/9  Decreased Interest 0 0 1 0 0  Down, Depressed, Hopeless 0 1 1 1  0  PHQ - 2 Score 0 1 2 1  0  Altered sleeping 0 2 1 1  0  Tired, decreased energy 0 1 1 0 0  Change in appetite 0 0 1 0 0  Feeling bad or failure about yourself  1 0 0 2 0  Trouble concentrating 1 1  3  0  Moving slowly or fidgety/restless 1 1 1  0 0  Suicidal thoughts 0 0 0 0 0  PHQ-9 Score 3 6 6 7  0      07/16/2023    5:18 PM 03/27/2023    4:25 PM 07/18/2022    1:43 PM 04/17/2022   11:19 AM  GAD 7 : Generalized Anxiety Score  Nervous, Anxious, on Edge 1 2 1 1   Control/stop worrying 1 1 1  0  Worry too much - different  things 1 1 1  0  Trouble relaxing 1 1 1  0  Restless 1 1 1  0  Easily annoyed or irritable 2 1 1 1   Afraid - awful might happen 0 1 0 1  Total GAD 7 Score 7 8 6  3

## 2023-07-04 ENCOUNTER — Other Ambulatory Visit (HOSPITAL_COMMUNITY)
Admission: RE | Admit: 2023-07-04 | Discharge: 2023-07-04 | Disposition: A | Discharge status: Home / Self Care | Payer: MEDICAID | Source: Other Acute Inpatient Hospital | Attending: Obstetrics | Admitting: Obstetrics

## 2023-07-04 ENCOUNTER — Ambulatory Visit (INDEPENDENT_AMBULATORY_CARE_PROVIDER_SITE_OTHER): Payer: MEDICAID | Admitting: Obstetrics

## 2023-07-04 ENCOUNTER — Encounter: Payer: Self-pay | Admitting: Obstetrics

## 2023-07-04 VITALS — BP 106/69 | HR 87

## 2023-07-04 DIAGNOSIS — N952 Postmenopausal atrophic vaginitis: Secondary | ICD-10-CM

## 2023-07-04 DIAGNOSIS — N390 Urinary tract infection, site not specified: Secondary | ICD-10-CM | POA: Diagnosis present

## 2023-07-04 DIAGNOSIS — R159 Full incontinence of feces: Secondary | ICD-10-CM

## 2023-07-04 DIAGNOSIS — R350 Frequency of micturition: Secondary | ICD-10-CM | POA: Diagnosis not present

## 2023-07-04 DIAGNOSIS — N3040 Irradiation cystitis without hematuria: Secondary | ICD-10-CM

## 2023-07-04 DIAGNOSIS — M62838 Other muscle spasm: Secondary | ICD-10-CM

## 2023-07-04 DIAGNOSIS — N3281 Overactive bladder: Secondary | ICD-10-CM | POA: Diagnosis not present

## 2023-07-04 DIAGNOSIS — R152 Fecal urgency: Secondary | ICD-10-CM

## 2023-07-04 LAB — POCT URINALYSIS DIPSTICK
Bilirubin, UA: NEGATIVE
Blood, UA: NEGATIVE
Glucose, UA: NEGATIVE
Ketones, UA: NEGATIVE
Leukocytes, UA: NEGATIVE
Nitrite, UA: POSITIVE
Protein, UA: NEGATIVE
Spec Grav, UA: >=1.03 — AB (ref 1.010–1.025)
Urobilinogen, UA: 0.2 U/dL
pH, UA: 6 (ref 5.0–8.0)

## 2023-07-04 MED ORDER — GEMTESA 75 MG PO TABS: 75.0000 mg | ORAL_TABLET | Freq: Every day | ORAL | 2 refills | Status: DC

## 2023-07-04 MED ORDER — PHENAZOPYRIDINE HCL 200 MG PO TABS
200.0000 mg | ORAL_TABLET | Freq: Three times a day (TID) | ORAL | 1 refills | Status: DC | PRN
Start: 2023-07-04 — End: 2023-09-13

## 2023-07-04 MED ORDER — GEMTESA 75 MG PO TABS: 75.0000 mg | ORAL_TABLET | Freq: Every day | ORAL | Status: DC

## 2023-07-04 MED ORDER — FOSFOMYCIN TROMETHAMINE 3 G PO PACK: 3.0000 g | PACK | ORAL | 0 refills | Status: DC

## 2023-07-04 NOTE — Progress Notes (Unsigned)
[facility] Return Visit  SUBJECTIVE  History of Present Illness: Jordan [name] is a 57 y.o. female seen in follow-up for recurrent UTI, vaginal atrophy, OAB, pelvic floor myofascial pain, and fecal incontinence. Plan at last visit was macrobid UTI suppression, vaginal estrogen, and start pelvic floor PT.   Switched from urology due to insurance restriction   Started macrobid with no change in urinary symptoms Used fosfomycin with some relief of symptoms.  Denies fever, chills, or back pain. Prior use of vaginal estrogen stopped per chart review from urology since patient stated she was advised against use from rad onc due to vaginal squamous cell carcinoma history. Per documentation from Dr. Roselind [name], estrace was discontinued since it was cost prohibitive and switched to pyridium. Negative CT abd/pelvis w/ contrast for fistula or bladder wall thickening 12/10/22. Vivelle dot managed by Dr. Alysia [name]  Previously evaluated by Jordan Fusi, DNP for radiation cystitis and recommended coconut oil for vaginal moisturizer, Keflex 250mg  daily.  Prior use of macrodantin 50mg  daily and discontinued due to diarrhea 06/19/23 UA + leuk/heme/protein/nit, culture >100K Klebsiella pneumoniae resistant to ampicillin and intermediate to gentamicin and macrobid.  Rx changed to fosfomycin, allergies to ceftriaxone, fluoroquinolone, and sulfa. Pending pelvic floor PT pending next month. History of sepsis requiring hospitalization from 08/17/22-08/22/22 secondary to complicated UTI, pancytopenia, and anemia.   Prior use of oxybutynin XL 15mg  twice daily and considered interstim in 2022 but cancelled procedure.  Diffuse bladder wall thickening likely due to prior radiation therapy per reports on CT abd/pelvis w/ contrast 05/24/21.  History of cystoscopy and bladder biopsy negative for cancer.  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   Past Surgical History: She  has a past surgical history that includes Cholecystectomy; Hysteroscopy with D & C (N/A, 01/25/2016); Cervical conization w/bx (N/A, 07/01/2016); Vaginal hysterectomy (N/A, 03/11/2017); Abdominal hysterectomy; Robot assisted mtomectomy (N/A, 07/22/2019); Lymph node biopsy (Bilateral, 07/22/2019); External fixation leg (Right, 04/09/2020); and Open reduction internal fixation (orif) tibia/fibula fracture (Right, 04/10/2020).   Medications: She has a current medication list which includes the following prescription(s): conjugated estrogens, estradiol, gabapentin, naproxen, nitrofurantoin (macrocrystal-monohydrate), and oxycodone hcl, and the following Facility-Administered Medications: sodium chloride.   Allergies: Patient is allergic to ceftriaxone, ciprofloxacin, citalopram, lamotrigine, sulfa antibiotics, and tramadol.   Social History: Patient  reports that she has never smoked. She has never used smokeless tobacco. She reports that she does not currently use alcohol. She reports that she does not use drugs.      OBJECTIVE     Physical Exam: There were no vitals filed for this visit. Gen: No apparent distress, A&O x 3.  Detailed Urogynecologic Evaluation:  Deferred. Prior exam showed:  Straight Catheterization Procedure for PVR: After verbal consent was obtained from the patient for catheterization to assess bladder emptying and residual volume the urethra and surrounding tissues were prepped with betadine and an in and out catheterization was performed.   PVR was ***mL.  Urine appeared {urine color:24768}. The patient tolerated the procedure well.       No data to display  ASSESSMENT AND PLAN    [name] is a 57 y.o. with:  1. Recurrent UTI   2. Vaginal atrophy   3. OAB (overactive bladder)   4. Incontinence of feces with fecal urgency   5. Levator spasm   6. Radiation cystitis    - ***probiotics, cranberry extract, D-mannose - continue vaginal estrogen, per chart review prior discontinuation due to cost.  - consider trimpex - start fiber supplementation for FI - pending pelvic floor PT for pelvic floor myofascial pain - ***offered Gemtesa for OAB symptoms. Consider bladder botox injections if rUTI resolves - *** discussed importance of urology follow-up due to history of radiation cystitis. Offered hyperbaric chamber for [facility] at [facility]  Time spent: I spent *** minutes dedicated to the care of this patient on the date of this encounter to include pre-visit review of records, face-to-face time with the patient discussing r-UTI management, need for urology follow-up due to history of radiation cystitis, OAB treatment, and post visit documentation and ordering medication/ testing.

## 2023-07-04 NOTE — Patient Instructions (Addendum)
Continue vaginal estrogen twice a week.   Use pyridium 200mg  three times a day as needed for UTI symptoms.   Trial of gemtesa one tab day for overactive bladder symptoms. Stop the medication if you experience difficulty with urination or skin rash.   Continue fosfomycin one packet a week for UTI suppression.  Please call if you experience worsening symptoms of UTI such as fever, blood  We will send a referral for hyperbaric chamber at [facility] for radiation cystitis.   Start pelvic floor physical therapy for your pelvic floor  muscle pain.

## 2023-07-06 LAB — URINE CULTURE: Culture: >=100000 — AB

## 2023-07-07 NOTE — Progress Notes (Signed)
Pt was cathed for 75cc and urine sent for culture

## 2023-07-08 ENCOUNTER — Telehealth: Payer: Self-pay

## 2023-07-08 NOTE — Telephone Encounter (Signed)
Returned her call and reviewed next appt date time. She verbalized understanding.

## 2023-07-09 ENCOUNTER — Telehealth: Payer: Self-pay

## 2023-07-09 ENCOUNTER — Other Ambulatory Visit: Payer: Self-pay

## 2023-07-09 DIAGNOSIS — N390 Urinary tract infection, site not specified: Secondary | ICD-10-CM

## 2023-07-09 NOTE — Telephone Encounter (Signed)
I agree

## 2023-07-09 NOTE — Telephone Encounter (Signed)
Called Mrs. Sassano back about her question regarding getting her B12 injection earlier.  Advised her that per Dr. Bertis Ruddy, the B12 works best if given 1 month apart and insurance may not cover it if it was given earlier.  Advised her to call back if the 9/20 appt was not a good time for her and we could reschedule her for 9/23 or 9/24.  Lorayne Marek, RN

## 2023-07-10 ENCOUNTER — Encounter: Payer: Self-pay | Admitting: Obstetrics

## 2023-07-10 MED ORDER — TRIMETHOPRIM 100 MG PO TABS: 100.0000 mg | ORAL_TABLET | Freq: Two times a day (BID) | ORAL | 0 refills | Status: AC

## 2023-07-10 NOTE — Telephone Encounter (Signed)
Patient called in and wants to get urine culture results.

## 2023-07-11 ENCOUNTER — Inpatient Hospital Stay: Payer: MEDICAID

## 2023-07-11 ENCOUNTER — Inpatient Hospital Stay: Payer: MEDICAID | Attending: Radiation Oncology

## 2023-07-11 ENCOUNTER — Encounter: Payer: Self-pay | Admitting: Hematology and Oncology

## 2023-07-11 VITALS — BP 122/67 | HR 91 | Temp 98.4°F

## 2023-07-11 DIAGNOSIS — E538 Deficiency of other specified B group vitamins: Secondary | ICD-10-CM | POA: Insufficient documentation

## 2023-07-11 MED ORDER — CYANOCOBALAMIN 1000 MCG/ML IJ SOLN
1000.0000 ug | Freq: Once | INTRAMUSCULAR | Status: AC
  Administered 2023-07-11: 1000 ug via INTRAMUSCULAR
  Filled 2023-07-11: qty 1

## 2023-07-11 NOTE — Patient Instructions (Signed)
Vitamin B12 Deficiency Vitamin B12 deficiency occurs when the body does not have enough of this important vitamin. The body needs this vitamin: To make red blood cells. To make DNA. This is the genetic material inside cells. To help the nerves work properly so they can carry messages from the brain to the body. Vitamin B12 deficiency can cause health problems, such as not having enough red blood cells in the blood (anemia). This can lead to nerve damage if untreated. What are the causes? This condition may be caused by: Not eating enough foods that contain vitamin B12. Not having enough stomach acid and digestive fluids to properly absorb vitamin B12 from the food that you eat. Having certain diseases that make it hard to absorb vitamin B12. These diseases include Crohn's disease, chronic pancreatitis, and cystic fibrosis. An autoimmune disorder in which the body does not make enough of a protein (intrinsic factor) within the stomach, resulting in not enough absorption of vitamin B12. Having a surgery in which part of the stomach or small intestine is removed. Taking certain medicines that make it hard for the body to absorb vitamin B12. These include: Heartburn medicines, such as antacids and proton pump inhibitors. Some medicines that are used to treat diabetes. What increases the risk? The following factors may make you more likely to develop a vitamin B12 deficiency: Being an older adult. Eating a vegetarian or vegan diet that does not include any foods that come from animals. Eating a poor diet while you are pregnant. Taking certain medicines. Having alcoholism. What are the signs or symptoms? In some cases, there are no symptoms of this condition. If the condition leads to anemia or nerve damage, various symptoms may occur, such as: Weakness. Tiredness (fatigue). Loss of appetite. Numbness or tingling in your hands and feet. Redness and burning of the tongue. Depression,  confusion, or memory problems. Trouble walking. If anemia is severe, symptoms can include: Shortness of breath. Dizziness. Rapid heart rate. How is this diagnosed? This condition may be diagnosed with a blood test to measure the level of vitamin B12 in your blood. You may also have other tests, including: A group of tests that measure certain characteristics of blood cells (complete blood count, CBC). A blood test to measure intrinsic factor. A procedure where a thin tube with a camera on the end is used to look into your stomach or intestines (endoscopy). Other tests may be needed to discover the cause of the deficiency. How is this treated? Treatment for this condition depends on the cause. This condition may be treated by: Changing your eating and drinking habits, such as: Eating more foods that contain vitamin B12. Drinking less alcohol or no alcohol. Getting vitamin B12 injections. Taking vitamin B12 supplements by mouth (orally). Your health care provider will tell you which dose is best for you. Follow these instructions at home: Eating and drinking  Include foods in your diet that come from animals and contain a lot of vitamin B12. These include: Meats and poultry. This includes beef, pork, chicken, [place], and organ meats, such as liver. Seafood. This includes clams, rainbow trout, salmon, tuna, and haddock. Eggs. Dairy foods such as milk, yogurt, and cheese. Eat foods that have vitamin B12 added to them (are fortified), such as ready-to-eat breakfast cereals. Check the label on the package to see if a food is fortified. The items listed above may not be a complete list of foods and beverages you can eat and drink. Contact a dietitian for  more information. Alcohol use Do not drink alcohol if: Your health care provider tells you not to drink. You are pregnant, may be pregnant, or are planning to become pregnant. If you drink alcohol: Limit how much you have to: 0-1 drink a  day for women. 0-2 drinks a day for men. Know how much alcohol is in your drink. In the [place] one drink equals one 12 oz bottle of beer (355 mL), one 5 oz glass of wine (148 mL), or one 1 oz glass of hard liquor (44 mL). General instructions Get vitamin B12 injections if told to by your health care provider. Take supplements only as told by your health care provider. Follow the directions carefully. Keep all follow-up visits. This is important. Contact a health care provider if: Your symptoms come back. Your symptoms get worse or do not improve with treatment. Get help right away: You develop shortness of breath. You have a rapid heart rate. You have chest pain. You become dizzy or you faint. These symptoms may be an emergency. Get help right away. Call 911. Do not wait to see if the symptoms will go away. Do not drive yourself to the hospital. Summary Vitamin B12 deficiency occurs when the body does not have enough of this important vitamin. Common causes include not eating enough foods that contain vitamin B12, not being able to absorb vitamin B12 from the food that you eat, having a surgery in which part of the stomach or small intestine is removed, or taking certain medicines. Eat foods that have vitamin B12 in them. Treatment may include making a change in the way you eat and drink, getting vitamin B12 injections, or taking vitamin B12 supplements. This information is not intended to replace advice given to you by your health care provider. Make sure you discuss any questions you have with your health care provider. Document Revised: 06/01/2021 Document Reviewed: 06/01/2021 Elsevier Patient Education  2024 [employer].

## 2023-07-15 ENCOUNTER — Ambulatory Visit: Payer: MEDICAID

## 2023-07-15 ENCOUNTER — Inpatient Hospital Stay: Payer: MEDICAID

## 2023-07-16 ENCOUNTER — Ambulatory Visit (INDEPENDENT_AMBULATORY_CARE_PROVIDER_SITE_OTHER): Payer: MEDICAID | Admitting: Clinical

## 2023-07-16 DIAGNOSIS — Z550 Illiteracy and low-level literacy: Secondary | ICD-10-CM | POA: Diagnosis not present

## 2023-07-16 DIAGNOSIS — F3160 Bipolar disorder, current episode mixed, unspecified: Secondary | ICD-10-CM

## 2023-07-16 DIAGNOSIS — F3131 Bipolar disorder, current episode depressed, mild: Secondary | ICD-10-CM | POA: Diagnosis not present

## 2023-07-16 DIAGNOSIS — Z658 Other specified problems related to psychosocial circumstances: Secondary | ICD-10-CM

## 2023-07-16 DIAGNOSIS — F4381 Prolonged grief disorder: Secondary | ICD-10-CM | POA: Diagnosis not present

## 2023-07-21 ENCOUNTER — Encounter: Payer: Self-pay | Admitting: General Practice

## 2023-07-21 NOTE — Progress Notes (Signed)
[facility] Spiritual Care Note  Returned voicemail with a voicemail about availability. Plan to phone again tomorrow if we miss each other today.   [address] [name], [place], [facility] Pager [phone] Voicemail [phone]

## 2023-07-22 ENCOUNTER — Encounter: Payer: Self-pay | Admitting: General Practice

## 2023-07-22 NOTE — Progress Notes (Signed)
[facility] Spiritual Care Note  Left follow-up voicemail encouraging return call when it would be helpful.   [address] [name], [place], [facility] Pager [phone] Voicemail [phone]

## 2023-07-24 ENCOUNTER — Encounter: Payer: Self-pay | Admitting: General Practice

## 2023-07-24 ENCOUNTER — Telehealth: Payer: Self-pay

## 2023-07-24 NOTE — Telephone Encounter (Signed)
Pt called this RN stating that she had received a call from [name] and was returning it. This RN states that it was not from this RN, but to try and call Misty Stanley from spiritual care since it seems like she had been trying to reach out to her and has LVMs. Pt verbalized understanding.   Pt asked if it is OK to proceed with B-12 inj on 10/18 if she received the inj on 09/20. This RN states to please plan to come in to proceed with appt. Pt verbalized understanding.

## 2023-07-24 NOTE — Progress Notes (Signed)
[facility] Spiritual Care Note  Received a call from Jordan [name] to process grief at the recent departure of one of her providers, as well as her experience in a PSR program, which has largely been helpful for her social life and weekly rhythm.  Provided empathic listening, emotional support, normalization of feelings, and continued grief education in the wake of her partner Jordan [name]'s death. Jordan [name] calls as needed for support.   [address] [name], [place], [facility] Pager [phone] Voicemail [phone]

## 2023-07-26 ENCOUNTER — Telehealth: Payer: Self-pay | Admitting: Obstetrics and Gynecology

## 2023-07-26 NOTE — Telephone Encounter (Signed)
Call from patient 07/26/23 @ 5:15 and 5:32 but patient did not answer return calls x2  Documented complaint: has bad odor, it burns and she is hurting, she cannot sleep  Medical records reviewed with las visit with Dr Olena Leatherwood: recurrent UTI with prior episode of sepsis, radiation cystitis from Squamous cell carcinoma of the vagina and multiple drug allergies. Last visit in September. Patient using Fosfomycin 1 x weekly for prophylaxis. Was given Gemtesa samples and awaiting to start pelvic floor PT for painful bladder syndrome.  Left VM with patient and confirmed with answering service: patient should be seen in Urgent Care or ED to be evaluated.

## 2023-07-29 NOTE — Telephone Encounter (Signed)
Called member 2x. LM on the VM for the patient to call us back.

## 2023-07-30 ENCOUNTER — Telehealth: Payer: Self-pay

## 2023-07-30 ENCOUNTER — Encounter: Payer: Self-pay | Admitting: Family Medicine

## 2023-07-30 ENCOUNTER — Ambulatory Visit: Payer: MEDICAID | Admitting: Family Medicine

## 2023-07-30 ENCOUNTER — Other Ambulatory Visit (HOSPITAL_COMMUNITY)
Admission: RE | Admit: 2023-07-30 | Discharge: 2023-07-30 | Disposition: A | Discharge status: Home / Self Care | Payer: MEDICAID | Source: Ambulatory Visit | Attending: Family Medicine | Admitting: Family Medicine

## 2023-07-30 VITALS — Wt 277.7 lb

## 2023-07-30 DIAGNOSIS — N951 Menopausal and female climacteric states: Secondary | ICD-10-CM

## 2023-07-30 DIAGNOSIS — Z124 Encounter for screening for malignant neoplasm of cervix: Secondary | ICD-10-CM | POA: Diagnosis present

## 2023-07-30 DIAGNOSIS — N898 Other specified noninflammatory disorders of vagina: Secondary | ICD-10-CM | POA: Insufficient documentation

## 2023-07-30 MED ORDER — METRONIDAZOLE 0.75 % VA GEL: 1.0000 | Freq: Every day | VAGINAL | 5 refills | Status: DC

## 2023-07-30 MED ORDER — ESTRADIOL 0.075 MG/24HR TD PTTW: 1.0000 | MEDICATED_PATCH | TRANSDERMAL | 12 refills | Status: DC

## 2023-07-30 NOTE — Telephone Encounter (Signed)
Called back and left message with appt date time on 10/18. She is asking if the appt is to close to her injection appt on 9/20. Left message that the appt date and time is okay. Ask her to call the office with questions.

## 2023-07-30 NOTE — Patient Instructions (Addendum)
You will start Metronidazole Gel at night to help with your vaginal odor  You will place an applicator full in your vagina every night for 10 nights. Then you will do every three days for  1 month and then can transition to weekly.   You should use your estrogen cream on the nights you do not use your metronidazole gel.   ----------   Natural Remedies for Bacterial Vaginosis  Option #1 1 Tbsp Fractitionated Coconut Oil 10 drops of Melaleuca (Tea Tree) Oil  Mix ingredients together well.  Soak 3-4 tampons (in applicators) in that mixture until all or mostly all mixture is soaked up into the tampons.  Insert 1 saturated tampon vaginally and wear overnight for 3-4 nights.    Option #2 (sometimes to be used in conjunction with option #1) Fill tub with enough to cover lap/lower abdomen warm water.  Mix 1/2 cup of baking soda in water.  Soak in water/baking soda mixture for at least 20 minutes.  Be sure to swish water in between legs to get as much in vagina as possible.  This soak should be done after sexual intercourse and menstrual cycles.     Option #3 (sometimes to be used in conjunction with option #1 and 2) Fill tub with enough to cover lap/lower abdomen warm water.  Mix 2-4 cup of apple cider vinegar in water.  Soak in water/vinegar mixture for at least 20 minutes.  Be sure to swish water in between legs to get as much in vagina as possible.  This soak should be done after sexual intercourse and menstrual cycles.    GO WHITE: Soap: UNSCENTED Dove (white box light green writing) Laundry detergent (underwear)- Dreft or [employer] unscented WHITE 100% cotton panties (NOT just cotton crouch) Sanitary napkin/panty liners: UNSCENTED.  If it doesn't SAY unscented it can have a scent/perfume    NO PERFUMES OR LOTIONS OR POTIONS in the vulvar area (may use regular [place]) Condoms: hypoallergenic only. Non dyed (no color) Toilet papers: white only Wash clothes: use a separate wash cloth.  WHITE.  Wash in [place].   You can purchase Tea Tree Oil locally at:  [employer] [address] [place] [address]  [employer] [address] [place] [address]  Advise that these alternatives will not replace the need to be evaluated if symptoms persist. You will need to seek care at an OB/GYN provider.

## 2023-07-30 NOTE — Progress Notes (Signed)
   GYNECOLOGY PROBLEM  VISIT ENCOUNTER NOTE  Subjective:   Jordan [name] is a 57 y.o. [id number] female here for a problem GYN visit.  Current complaints: vaginal odor.     Sees urology- recurrent UTIs. 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 Posterior margin of her vaginal cuff was broadly positive for tumor cells. Sentinel lymph nodes were negative.  Radiation from 08/24/2019- 09/22/2019  Last pap was in June 2023  Denies abnormal vaginal bleeding, discharge, pelvic pain, problems with intercourse or other gynecologic concerns.    Gynecologic History No LMP recorded (lmp unknown). Patient has had a hysterectomy.  Contraception: status post hysterectomy- for cancer and will need pap smears at regular intervals per guidelines.   Health Maintenance Due  Topic Date Due   Zoster Vaccines- Shingrix (1 of 2) Never done   Colonoscopy  Never done   COVID-19 Vaccine (3 - Moderna risk series) 03/11/2020   Cervical Cancer Screening (Pap smear)  04/04/2023   INFLUENZA VACCINE  Never done    The following portions of the patient's history were reviewed and updated as appropriate: allergies, current medications, past family history, past medical history, past social history, past surgical history and problem list.  Review of Systems Pertinent items are noted in HPI.   Objective:  Wt 277 lb 11.2 oz (126 kg)   LMP  (LMP Unknown)   BMI 42.43 kg/m   Gen: well appearing, NAD HEENT: no scleral icterus CV: RR Lung: Normal WOB Ext: warm well perfused  PELVIC: Normal appearing external genitalia; normal appearing vaginal mucosa and cervix.  No abnormal discharge noted.  Atropic tissue. + foul odor.  Pap smear obtained.  Normal uterine size, no other palpable masses, no uterine or adnexal tenderness.   Assessment and Plan:  1. Cervical cancer screening -  Cytology - PAP( [place])  2. Vaginal odor - metroNIDAZOLE (METROGEL) 0.75 % vaginal gel; Place 1 Applicatorful vaginally at bedtime. Apply one applicatorful to vagina at bedtime for 10 days, then every 3 days for 1 month and then weekly for 6 months.  Dispense: 70 g; Refill: 5 - Cervicovaginal ancillary only( [place])  3. Menopausal symptoms - estradiol (VIVELLE-DOT) 0.075 MG/24HR; Place 1 patch onto the skin 2 (two) times a week.  Dispense: 8 patch; Refill: 12   Please refer to After Visit Summary for other counseling recommendations.   Return in about 3 months (around 10/30/2023).  Future Appointments  Date Time Provider Department Center  08/08/2023  2:15 PM [facility] FLUSH [facility] None  08/11/2023  9:45 AM [facility]  09/05/2023 12:00 PM [facility] LAB [facility] None  09/05/2023 12:40 PM Artis Delay, MD [facility] None  09/05/2023  1:45 PM [facility] FLUSH [facility] None  09/15/2023 10:00 AM Antony Blackbird, MD [facility] None  09/25/2023  1:00 PM Theressa Millard, PT [facility]  09/30/2023  3:00 PM Theressa Millard, PT [facility]  10/07/2023  2:40 PM Loleta Chance, MD [facility]  10/09/2023  3:00 PM Theressa Millard, PT [facility]  10/23/2023  3:00 PM Theressa Millard, PT [facility]     Federico Flake, MD, MPH, ABFM Attending Physician Faculty Practice- Center for [facility]

## 2023-08-01 LAB — CERVICOVAGINAL ANCILLARY ONLY
Bacterial Vaginitis (gardnerella): NEGATIVE
Candida Glabrata: NEGATIVE
Candida Vaginitis: NEGATIVE
Comment: NEGATIVE
Comment: NEGATIVE
Comment: NEGATIVE
Comment: NEGATIVE
Trichomonas: NEGATIVE

## 2023-08-05 ENCOUNTER — Ambulatory Visit: Payer: MEDICAID | Admitting: Obstetrics and Gynecology

## 2023-08-05 ENCOUNTER — Telehealth: Payer: Self-pay | Admitting: Family Medicine

## 2023-08-05 LAB — CYTOLOGY - PAP: Diagnosis: NEGATIVE

## 2023-08-05 NOTE — Telephone Encounter (Signed)
Would like a call back regarding her test results

## 2023-08-06 ENCOUNTER — Telehealth: Payer: Self-pay

## 2023-08-06 ENCOUNTER — Other Ambulatory Visit: Payer: MEDICAID

## 2023-08-06 DIAGNOSIS — N9489 Other specified conditions associated with female genital organs and menstrual cycle: Secondary | ICD-10-CM

## 2023-08-06 DIAGNOSIS — R300 Dysuria: Secondary | ICD-10-CM

## 2023-08-06 NOTE — Telephone Encounter (Signed)
Patient called in again today because she did not get a call back about her results yesterday.

## 2023-08-06 NOTE — Telephone Encounter (Signed)
Patient called in to report severe dysuria and vaginal odor. Patient requesting  nurse visit to come in for UA C&S.

## 2023-08-06 NOTE — Telephone Encounter (Signed)
Returned call to patient regarding her recent test results. Informed patient her pap smear and vaginal swab was normal. Patient inquired about her urine culture results. Based on chart review, urine culture was not collected during the visit but advised patient to come to our office today to drop off a urine sample. Patient endorses vaginal odor, burning and vaginal pain. Patient will set up Medicaid transportation to come to our office this afternoon. Lab appointment made for patient to drop of urine at 1530. Patient states she will call back if she needs to reschedule appointment for tomorrow.  Marcelino Duster, RN

## 2023-08-07 ENCOUNTER — Ambulatory Visit: Payer: MEDICAID

## 2023-08-08 ENCOUNTER — Inpatient Hospital Stay: Payer: MEDICAID | Attending: Radiation Oncology

## 2023-08-08 ENCOUNTER — Telehealth: Payer: Self-pay

## 2023-08-08 DIAGNOSIS — E538 Deficiency of other specified B group vitamins: Secondary | ICD-10-CM | POA: Insufficient documentation

## 2023-08-08 MED ORDER — CYANOCOBALAMIN 1000 MCG/ML IJ SOLN
1000.0000 ug | Freq: Once | INTRAMUSCULAR | Status: AC
  Administered 2023-08-08: 1000 ug via INTRAMUSCULAR
  Filled 2023-08-08: qty 1

## 2023-08-08 NOTE — Telephone Encounter (Signed)
Called and reviewed her appt time for injection today. She is aware of appt date/time.

## 2023-08-11 ENCOUNTER — Other Ambulatory Visit: Payer: MEDICAID

## 2023-08-11 NOTE — BH Specialist Note (Unsigned)
error 

## 2023-08-11 NOTE — BH Specialist Note (Deleted)
Integrated Behavioral Health Follow Up In-Person Visit  MRN: [id number] Name: Jordan [name]  Number of Integrated Behavioral Health Clinician visits: Additional Visit  Session Start time: 1620   Session End time: 1719  Total time in minutes: 59   Types of Service: {CHL AMB TYPE OF [phone]}  Interpretor:Yes.   Interpretor Name and Language: n/a  Subjective: Jordan [name] is a 57 y.o. female accompanied by {Patient accompanied by:[phone]} Patient was referred by *** for ***. Patient reports the following symptoms/concerns: *** Duration of problem: ***; Severity of problem: {Mild/Moderate/Severe:20260}  Objective: Mood: {BHH MOOD:22306} and Affect: {BHH AFFECT:22307} Risk of harm to self or others: {CHL AMB BH Suicide Current Mental Status:21022748}  Life Context: Family and Social: *** School/Work: *** Self-Care: *** Life Changes: ***  Patient and/or Family's Strengths/Protective Factors: {CHL AMB BH PROTECTIVE FACTORS:[phone]}  Goals Addressed: Patient will:  Reduce symptoms of: {IBH Symptoms:21014056}   Increase knowledge and/or ability of: {IBH Patient Tools:21014057}   Demonstrate ability to: {IBH Goals:21014053}  Progress towards Goals: {CHL AMB BH PROGRESS TOWARDS GOALS:4190352399}  Interventions: Interventions utilized:  {IBH Interventions:21014054} Standardized Assessments completed: {IBH Screening Tools:21014051}  Patient and/or Family Response: Patient agrees with treatment plan.   Patient Centered Plan: Patient is on the following Treatment Plan(s): IBH Assessment: Patient currently experiencing ***.   Patient may benefit from continued therapeutic intervention *** .  Plan: Follow up with behavioral health clinician on : *** Behavioral recommendations:  -*** -*** Referral(s): {IBH Referrals:21014055}  [name], LCSW     07/16/2023    5:18 PM 03/27/2023    4:22 PM 07/18/2022    1:43 PM 04/17/2022   11:19 AM  08/20/2021    2:13 PM  Depression screen PHQ 2/9  Decreased Interest 0 0 1 0 0  Down, Depressed, Hopeless 0 1 1 1  0  PHQ - 2 Score 0 1 2 1  0  Altered sleeping 0 2 1 1  0  Tired, decreased energy 0 1 1 0 0  Change in appetite 0 0 1 0 0  Feeling bad or failure about yourself  1 0 0 2 0  Trouble concentrating 1 1  3  0  Moving slowly or fidgety/restless 1 1 1  0 0  Suicidal thoughts 0 0 0 0 0  PHQ-9 Score 3 6 6 7  0      07/16/2023    5:18 PM 03/27/2023    4:25 PM 07/18/2022    1:43 PM 04/17/2022   11:19 AM  GAD 7 : Generalized Anxiety Score  Nervous, Anxious, on Edge 1 2 1 1   Control/stop worrying 1 1 1  0  Worry too much - different things 1 1 1  0  Trouble relaxing 1 1 1  0  Restless 1 1 1  0  Easily annoyed or irritable 2 1 1 1   Afraid - awful might happen 0 1 0 1  Total GAD 7 Score 7 8 6  3

## 2023-08-12 ENCOUNTER — Inpatient Hospital Stay: Payer: MEDICAID

## 2023-08-14 ENCOUNTER — Ambulatory Visit (INDEPENDENT_AMBULATORY_CARE_PROVIDER_SITE_OTHER): Payer: MEDICAID | Admitting: Clinical

## 2023-08-14 DIAGNOSIS — F3160 Bipolar disorder, current episode mixed, unspecified: Secondary | ICD-10-CM

## 2023-08-14 DIAGNOSIS — Z658 Other specified problems related to psychosocial circumstances: Secondary | ICD-10-CM

## 2023-08-14 NOTE — BH Specialist Note (Signed)
Integrated Behavioral Health via Telemedicine Visit  08/14/2023 Nardos Skenandore [id number]  Number of Integrated Behavioral Health Clinician visits: Additional Visit  Session Start time: 1020   Session End time: 1049  Total time in minutes: 29   Referring Provider: Nettie Elm, MD Patient/Family location: Home [facility] Provider location: [facility]  All persons participating in visit: Patient Jordan [name] and [facility] Alda Gaultney   Types of Service: Individual psychotherapy and Telephone visit  I connected with [name] and/or Teea Drees's  n/a  via  Telephone or Video Enabled Telemedicine Application  (Video is Caregility application) and verified that I am speaking with the correct person using two identifiers. Discussed confidentiality: Yes   I discussed the limitations of telemedicine and the availability of in person appointments.  Discussed there is a possibility of technology failure and discussed alternative modes of communication if that failure occurs.  I discussed that engaging in this telemedicine visit, they consent to the provision of behavioral healthcare and the services will be billed under their insurance.  Patient and/or legal guardian expressed understanding and consented to Telemedicine visit: Yes   Presenting Concerns: Patient and/or family reports the following symptoms/concerns: Frustration with ongoing UTI, political climate and disappointment about losing her previous gynecologist/difficulty trusting new providers.  Duration of problem: Ongoing; Severity of problem: moderate  Patient and/or Family's Strengths/Protective Factors: Social connections, Concrete supports in place (healthy food, safe environments, etc.), and Sense of purpose  Goals Addressed: Patient will:  Reduce symptoms of: agitation, anxiety, depression, mood instability, and stress   Increase knowledge and/or ability of: stress  reduction   Demonstrate ability to: Increase motivation to adhere to plan of care  Progress towards Goals: Ongoing  Interventions: Interventions utilized:  Motivational Interviewing and Supportive Reflection Standardized Assessments completed: Not Needed  Patient and/or Family Response: Patient agrees with treatment plan.   Assessment: Patient currently experiencing Bipolar affective disorder: Prolonged grief disorder; Psychosocial stress.   Patient may benefit from continued therapeutic intervention; would benefit from establishing with ongoing therapy and psychiatry.  Plan: Follow up with behavioral health clinician on : Four weeks Behavioral recommendations:  -Continue attending [facility] for group support/activities -Continue maintaining positive connections with daughter and landlord -Continue plan to establish care with ongoing therapist via [facility] when that becomes available Referral(s): Integrated [employer] (In Clinic)  I discussed the assessment and treatment plan with the patient and/or parent/guardian. They were provided an opportunity to ask questions and all were answered. They agreed with the plan and demonstrated an understanding of the instructions.   They were advised to call back or seek an in-person evaluation if the symptoms worsen or if the condition fails to improve as anticipated.  [name] Tessia Kassin, LCSW

## 2023-08-15 ENCOUNTER — Telehealth: Payer: Self-pay | Admitting: *Deleted

## 2023-08-15 NOTE — Telephone Encounter (Signed)
Pt left message in apt VM wanting to be seen for a vaginal sore.  I called pt back and left message on VM advising to see her primary OBGYN for this problem and provided that number.  KWD CMA

## 2023-08-18 ENCOUNTER — Encounter: Payer: Self-pay | Admitting: Obstetrics and Gynecology

## 2023-08-18 ENCOUNTER — Telehealth: Payer: Self-pay | Admitting: Lactation Services

## 2023-08-18 ENCOUNTER — Ambulatory Visit (INDEPENDENT_AMBULATORY_CARE_PROVIDER_SITE_OTHER): Payer: MEDICAID | Admitting: Obstetrics and Gynecology

## 2023-08-18 VITALS — BP 139/72 | HR 97 | Wt 278.7 lb

## 2023-08-18 DIAGNOSIS — N9489 Other specified conditions associated with female genital organs and menstrual cycle: Secondary | ICD-10-CM

## 2023-08-18 MED ORDER — ESTRADIOL 10 MCG VA TABS
1.0000 | ORAL_TABLET | Freq: Every evening | VAGINAL | 12 refills | Status: DC | PRN
Start: 2023-08-18 — End: 2023-08-19

## 2023-08-18 NOTE — Telephone Encounter (Signed)
PA for [facility] sent through Cover My Meds. Awaiting response.    Jordan [name] (Key: [facility]) Rx #: [id number] Need Help? Call us at [phone] Status Sent to Plan today Drug Yuvafem tablets Form [employer] Electronic Prior Authorization Form Original Claim Info 75

## 2023-08-18 NOTE — Patient Instructions (Addendum)
Use the vaginal estrogen every night for 2 weeks and then 2 nights

## 2023-08-19 MED ORDER — IMVEXXY MAINTENANCE PACK 10 MCG VA INST
1.0000 | VAGINAL_INSERT | VAGINAL | 12 refills | Status: DC
Start: 2023-08-19 — End: 2023-08-20

## 2023-08-19 MED ORDER — IMVEXXY STARTER PACK 10 MCG VA INST
1.0000 | VAGINAL_INSERT | Freq: Every day | VAGINAL | 0 refills | Status: DC
Start: 2023-08-19 — End: 2023-08-20

## 2023-08-19 NOTE — Telephone Encounter (Signed)
DBriscoe Deutscher notified. Ordered Imvexxy instead. Will follow up with PA as needed.

## 2023-08-19 NOTE — Telephone Encounter (Signed)
Anson Fret PA denied by insurance. Will send to Dr. Briscoe Deutscher for advisement.    [name] (Key: [facility]) PA Case ID #: [id number] Rx #: [id number] Need Help? Call us at [phone] Outcome Denied on October 28 by [employer] 2017 Denied Drug Anson Fret tablets ePA cloud logo Form [employer] Electronic Prior Authorization Form Original Claim Info 75

## 2023-08-20 ENCOUNTER — Other Ambulatory Visit: Payer: Self-pay | Admitting: Obstetrics and Gynecology

## 2023-08-20 ENCOUNTER — Encounter: Payer: Self-pay | Admitting: Lactation Services

## 2023-08-20 DIAGNOSIS — N9489 Other specified conditions associated with female genital organs and menstrual cycle: Secondary | ICD-10-CM

## 2023-08-20 MED ORDER — ESTRADIOL 10 MCG VA TABS
1.0000 | ORAL_TABLET | Freq: Every day | VAGINAL | 12 refills | Status: DC
Start: 2023-08-20 — End: 2023-09-13

## 2023-08-28 ENCOUNTER — Telehealth: Payer: Self-pay

## 2023-08-28 NOTE — Telephone Encounter (Signed)
Second attempt to return patient's call.  Left voicemail with callback number and instructions on how to utilize our nurse triage line should patient need urgent assistance.

## 2023-08-28 NOTE — Telephone Encounter (Signed)
Returned patient's call regarding upcoming appointments. LVM on patient's phone with date and times for upcoming appointments on 11/15. Callback number provided should she have any follow-up questions.

## 2023-08-29 ENCOUNTER — Telehealth: Payer: Self-pay

## 2023-08-29 NOTE — Telephone Encounter (Signed)
Attempted to call patient back on requested primary number. Left voicemail with callback number and instructions on how to utilize the nurse triage line, should the patient have any urgent questions or concerns.

## 2023-09-01 ENCOUNTER — Telehealth: Payer: Self-pay

## 2023-09-01 ENCOUNTER — Ambulatory Visit
Admission: RE | Admit: 2023-09-01 | Discharge: 2023-09-01 | Disposition: A | Discharge status: Home / Self Care | Payer: MEDICAID | Source: Ambulatory Visit | Attending: Radiation Oncology | Admitting: Radiation Oncology

## 2023-09-01 ENCOUNTER — Ambulatory Visit
Admission: RE | Admit: 2023-09-01 | Discharge: 2023-09-01 | Disposition: A | Discharge status: Home / Self Care | Payer: MEDICAID | Source: Ambulatory Visit | Attending: Internal Medicine | Admitting: Internal Medicine

## 2023-09-01 ENCOUNTER — Other Ambulatory Visit: Payer: Self-pay

## 2023-09-01 DIAGNOSIS — R300 Dysuria: Secondary | ICD-10-CM

## 2023-09-01 DIAGNOSIS — E538 Deficiency of other specified B group vitamins: Secondary | ICD-10-CM

## 2023-09-01 DIAGNOSIS — Z8544 Personal history of malignant neoplasm of other female genital organs: Secondary | ICD-10-CM | POA: Diagnosis present

## 2023-09-01 LAB — URINALYSIS, COMPLETE (UACMP) WITH MICROSCOPIC
Bilirubin Urine: NEGATIVE
Glucose, UA: NEGATIVE mg/dL
Ketones, ur: NEGATIVE mg/dL
Leukocytes,Ua: NEGATIVE
Nitrite: POSITIVE — AB
Protein, ur: NEGATIVE mg/dL
Specific Gravity, Urine: 1.024 (ref 1.005–1.030)
pH: 5 (ref 5.0–8.0)

## 2023-09-01 LAB — CBC WITH DIFFERENTIAL (CANCER CENTER ONLY)
Abs Immature Granulocytes: 0.01 10*3/uL (ref 0.00–0.07)
Basophils Absolute: 0 10*3/uL (ref 0.0–0.1)
Basophils Relative: 0 %
Eosinophils Absolute: 0 10*3/uL (ref 0.0–0.5)
Eosinophils Relative: 0 %
HCT: 40.2 % (ref 36.0–46.0)
Hemoglobin: 14.4 g/dL (ref 12.0–15.0)
Immature Granulocytes: 0 %
Lymphocytes Relative: 21 %
Lymphs Abs: 1.1 10*3/uL (ref 0.7–4.0)
MCH: 32 pg (ref 26.0–34.0)
MCHC: 35.8 g/dL (ref 30.0–36.0)
MCV: 89.3 fL (ref 80.0–100.0)
Monocytes Absolute: 0.4 10*3/uL (ref 0.1–1.0)
Monocytes Relative: 7 %
Neutro Abs: 3.8 10*3/uL (ref 1.7–7.7)
Neutrophils Relative %: 72 %
Platelet Count: 175 10*3/uL (ref 150–400)
RBC: 4.5 MIL/uL (ref 3.87–5.11)
RDW: 12.2 % (ref 11.5–15.5)
WBC Count: 5.3 10*3/uL (ref 4.0–10.5)
nRBC: 0 % (ref 0.0–0.2)

## 2023-09-01 LAB — VITAMIN B12: Vitamin B-12: 546 pg/mL (ref 180–914)

## 2023-09-01 NOTE — Progress Notes (Signed)
  Patient called in to reports dysuria, and foul odor. Per Dr. Roselind Messier obtain UA C&S.  In and out cath performed by Chrystine Oiler NT 3.

## 2023-09-01 NOTE — Telephone Encounter (Signed)
Returned her call and reviewed 11/15 appt times. She verbalized understanding.

## 2023-09-01 NOTE — Telephone Encounter (Signed)
Jordan [name] is a 57 y.o. female called in complains of vaginal burning all the time with an odor. Pt said she stopped the estradiol cream because it made the smell worse.  Pt was advised to call her gyn Dr to have a vaginal swab done because there is no provider in office today. Pt agreed and will contact her gyn

## 2023-09-02 NOTE — BH Specialist Note (Unsigned)
Integrated Behavioral Health Follow Up In-Person Visit  MRN: [id number] Name: Jordan [name]  Number of Integrated Behavioral Health Clinician visits: Additional Visit  Session Start time: 1020   Session End time: 1049  Total time in minutes: 29   Types of Service: Individual psychotherapy  Interpretor:No. Interpretor Name and Language: n/a  Subjective: Jordan [name] is a 57 y.o. female accompanied by {Patient accompanied by:[phone]} Patient was referred by *** for ***. Patient reports the following symptoms/concerns: *** Duration of problem: ***; Severity of problem: {Mild/Moderate/Severe:20260}  Objective: Mood: {BHH MOOD:22306} and Affect: {BHH AFFECT:22307} Risk of harm to self or others: {CHL AMB BH Suicide Current Mental Status:21022748}  Life Context: Family and Social: *** School/Work: *** Self-Care: *** Life Changes: ***  Patient and/or Family's Strengths/Protective Factors: {CHL AMB BH PROTECTIVE [phone]}  Goals Addressed: Patient will:  Reduce symptoms of: {IBH Symptoms:21014056}   Increase knowledge and/or ability of: {IBH Patient Tools:21014057}   Demonstrate ability to: {IBH Goals:21014053}  Progress towards Goals: Ongoing  Interventions: Interventions utilized:  {IBH Interventions:21014054} Standardized Assessments completed: {IBH Screening Tools:21014051}  Patient and/or Family Response: Patient agrees with treatment plan.   Patient Centered Plan: Patient is on the following Treatment Plan(s): IBH Assessment: Patient currently experiencing ***.   Patient may benefit from continued therapeutic intervention *** .  Plan: Follow up with behavioral health clinician on : *** Behavioral recommendations:  -*** -*** Referral(s): {IBH Referrals:21014055}  [name] [name], LCSW     07/16/2023    5:18 PM 03/27/2023    4:22 PM 07/18/2022    1:43 PM 04/17/2022   11:19 AM 08/20/2021    2:13 PM  Depression screen PHQ 2/9  Decreased  Interest 0 0 1 0 0  Down, Depressed, Hopeless 0 1 1 1  0  PHQ - 2 Score 0 1 2 1  0  Altered sleeping 0 2 1 1  0  Tired, decreased energy 0 1 1 0 0  Change in appetite 0 0 1 0 0  Feeling bad or failure about yourself  1 0 0 2 0  Trouble concentrating 1 1  3  0  Moving slowly or fidgety/restless 1 1 1  0 0  Suicidal thoughts 0 0 0 0 0  PHQ-9 Score 3 6 6 7  0      07/16/2023    5:18 PM 03/27/2023    4:25 PM 07/18/2022    1:43 PM 04/17/2022   11:19 AM  GAD 7 : Generalized Anxiety Score  Nervous, Anxious, on Edge 1 2 1 1   Control/stop worrying 1 1 1  0  Worry too much - different things 1 1 1  0  Trouble relaxing 1 1 1  0  Restless 1 1 1  0  Easily annoyed or irritable 2 1 1 1   Afraid - awful might happen 0 1 0 1  Total GAD 7 Score 7 8 6  3

## 2023-09-04 LAB — URINE CULTURE: Culture: >=100000 — AB

## 2023-09-05 ENCOUNTER — Encounter: Payer: Self-pay | Admitting: Hematology and Oncology

## 2023-09-05 ENCOUNTER — Inpatient Hospital Stay: Payer: MEDICAID | Attending: Hematology and Oncology | Admitting: Hematology and Oncology

## 2023-09-05 ENCOUNTER — Inpatient Hospital Stay: Payer: MEDICAID

## 2023-09-05 VITALS — BP 127/77 | HR 75 | Temp 97.4°F | Resp 18 | Ht 67.84 in | Wt 280.8 lb

## 2023-09-05 DIAGNOSIS — D61818 Other pancytopenia: Secondary | ICD-10-CM | POA: Insufficient documentation

## 2023-09-05 DIAGNOSIS — E538 Deficiency of other specified B group vitamins: Secondary | ICD-10-CM | POA: Diagnosis not present

## 2023-09-05 MED ORDER — CYANOCOBALAMIN 1000 MCG/ML IJ SOLN
1000.0000 ug | Freq: Once | INTRAMUSCULAR | Status: AC
  Administered 2023-09-05: 1000 ug via INTRAMUSCULAR
  Filled 2023-09-05: qty 1

## 2023-09-05 NOTE — Progress Notes (Signed)
[facility] OFFICE PROGRESS NOTE  Courtney Paris, NP  ASSESSMENT & PLAN:  Vitamin B12 deficiency I reviewed test result with her She has excellent response to aggressive vitamin B12 injection monthly Her B12 level is now within normal range After injection today, I will space out her treatment to every 3 months I will see her back in 1 year  Orders Placed This Encounter  Procedures   CBC with Differential (Cancer Center Only)    Standing Status:   Future    Standing Expiration Date:   09/04/2024   Vitamin B12    Standing Status:   Future    Standing Expiration Date:   09/04/2024    The total time spent in the appointment was 20 minutes encounter with patients including review of chart and various tests results, discussions about plan of care and coordination of care plan   All questions were answered. The patient knows to call the clinic with any problems, questions or concerns. No barriers to learning was detected.    Artis Delay, MD 11/15/202412:34 PM  INTERVAL HISTORY: Mikeia Loftis 57 y.o. female returns for follow-up for history of vitamin B12 deficiency She has been receiving vitamin B12 injection monthly We discussed test results and future plan of care  SUMMARY OF HEMATOLOGIC HISTORY: Please see my original consult note dated August 12, 2022 for further details Samiha Eastridge was seen here because of severe pancytopenia The patient is a poor historian She lives by herself She was noted to have abnormal CBC in 2016 requiring bone marrow aspirate and biopsy   On 02/20/2015, she underwent bone marrow aspirate and biopsy, ordered by Dr. Cyndie Chime.  The patient did not recall having the bone marrow biopsy done.  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 - SEE COMMENT. PERIPHERAL BLOOD: -  PANCYTOPENIA Diagnosis Note The bone marrow is hypercellular for age with trilineage hematopoiesis but with striking megaloblastic changes essentially involving all cell lines. No increase in blastic cells is identified by morphology or flow cytometric studies. The overall morphologic features may be due nutritional deficiency (vitamin B12, folate), medication (antibiotics, anticonvulsants, etc.), liver disease, thyroid disease, toxins, alcohol. While myelodysplasia is also in the differential diagnosis, it is not favored at this time. Clinical and cytogenetic correlation is recommended.  At that time, her white blood cell count was 1.3, hemoglobin 7.0, MCV of 105.1 and platelet count of 90,000   At that time, her B12 level was normal Her most recent normal CBC dated August 13, 2020 showed white count of 6.0, hemoglobin 14.9 and platelet count 171,000 On October 08, 2021, her white blood cell count was 4.6, hemoglobin 14.4 and platelet count 126,000 On admission, on August 17, 2022, her white blood cell count was 1.3, hemoglobin 7.6, MCV 122 and platelet count of 71,000 This morning, it dropped further with a white count of 1.6, hemoglobin 6.4, MCV of 127.3 and platelet count of 62 She was receiving blood transfusion when I entered the room She denies dizziness, chest pain or shortness of breath She had not noticed any recent bleeding such as epistaxis, hematuria or hematochezia The patient denies over the counter NSAID ingestion. She is not on antiplatelets agents. She has never have endoscopy evaluation She denies any pica and eats a variety of diet.   Of note, vitamin B12 level was drawn in the emergency department and it came  back less than 50 She denies smoking or alcohol intake Denies over-the-counter supplement   She has history of vaginal cancer status post radiation treatment Her last radiation treatment was in 2020 in which she was evaluated by Dr. Roselind Messier on July 23, 2022 She  has chronic discomfort, vaginal discharge and severe dysuria since completion of radiation treatment, and was prescribed Diflucan She continues to have chronic symptoms of dysuria for a long time She started receiving high doses of vitamin B12 injection since 2023  I have reviewed the past medical history, past surgical history, social history and family history with the patient and they are unchanged from previous note.  ALLERGIES:  is allergic to ceftriaxone, ciprofloxacin, citalopram, lamotrigine, sulfa antibiotics, and tramadol.  MEDICATIONS:  Current Outpatient Medications  Medication Sig Dispense Refill   conjugated estrogens (PREMARIN) vaginal cream Place 1 Applicatorful vaginally 2 (two) times a week. Place 0.5g nightly for two weeks then twice a week after 30 g 5   estradiol (VIVELLE-DOT) 0.075 MG/24HR Place 1 patch onto the skin 2 (two) times a week. 8 patch 12   Estradiol 10 MCG TABS vaginal tablet Place 1 tablet (10 mcg total) vaginally at bedtime. Nightly at bedtime for 2 weeks then 2 nights a week afterwards 30 tablet 12   fosfomycin (MONUROL) 3 g PACK Take 3 g by mouth once a week. Dissolve packet in 3-4 oz of water and drink immediately. 12 each 0   gabapentin (NEURONTIN) 800 MG tablet Take 800 mg by mouth 3 (three) times daily.     metroNIDAZOLE (METROGEL) 0.75 % vaginal gel Place 1 Applicatorful vaginally at bedtime. Apply one applicatorful to vagina at bedtime for 10 days, then every 3 days for 1 month and then weekly for 6 months. 70 g 5   naproxen (NAPROSYN) 500 MG tablet Take 500 mg by mouth 2 (two) times daily as needed.     Oxycodone HCl 10 MG TABS Take 10 mg by mouth daily as needed.     phenazopyridine (PYRIDIUM) 200 MG tablet Take 1 tablet (200 mg total) by mouth 3 (three) times daily as needed for pain. 10 tablet 1   Vibegron (GEMTESA) 75 MG TABS Take 1 tablet (75 mg total) by mouth daily.     Vibegron (GEMTESA) 75 MG TABS Take 1 tablet (75 mg total) by mouth daily.  30 tablet 2   No current facility-administered medications for this visit.     REVIEW OF SYSTEMS:   Constitutional: Denies fevers, chills or night sweats Eyes: Denies blurriness of vision Ears, nose, mouth, throat, and face: Denies mucositis or sore throat Respiratory: Denies cough, dyspnea or wheezes Cardiovascular: Denies palpitation, chest discomfort or lower extremity swelling Gastrointestinal:  Denies nausea, heartburn or change in bowel habits Skin: Denies abnormal skin rashes Lymphatics: Denies new lymphadenopathy or easy bruising Neurological:Denies numbness, tingling or new weaknesses Behavioral/Psych: Mood is stable, no new changes  All other systems were reviewed with the patient and are negative.  PHYSICAL EXAMINATION: ECOG PERFORMANCE STATUS: 0 - Asymptomatic  Vitals:   09/05/23 1216  BP: 127/77  Pulse: 75  Resp: 18  Temp: (!) 97.4 F (36.3 C)  SpO2: 98%   Filed Weights   09/05/23 1216  Weight: 280 lb 12.8 oz (127.4 kg)    GENERAL:alert, no distress and comfortable   LABORATORY DATA:  I have reviewed the data as listed     Component Value Date/Time   NA 142 11/21/2022 1315   K 3.5 11/21/2022 1315  CL 109 11/21/2022 1315   CO2 26 11/21/2022 1315   GLUCOSE 82 11/21/2022 1315   BUN 9 11/21/2022 1315   CREATININE 0.55 11/21/2022 1315   CREATININE 0.62 11/08/2022 1147   CALCIUM 8.0 (L) 11/21/2022 1315   PROT 6.1 (L) 11/21/2022 1315   ALBUMIN 3.3 (L) 11/21/2022 1315   AST 17 11/21/2022 1315   AST 24 11/08/2022 1147   ALT 14 11/21/2022 1315   ALT 27 11/08/2022 1147   ALKPHOS 93 11/21/2022 1315   BILITOT 0.8 11/21/2022 1315   BILITOT 0.6 11/08/2022 1147   GFRNONAA >60 11/21/2022 1315   GFRNONAA >60 11/08/2022 1147   GFRAA >60 05/03/2020 0707    No results found for: "SPEP", "UPEP"  Lab Results  Component Value Date   WBC 5.3 09/01/2023   NEUTROABS 3.8 09/01/2023   HGB 14.4 09/01/2023   HCT 40.2 09/01/2023   MCV 89.3 09/01/2023   PLT 175  09/01/2023      Chemistry      Component Value Date/Time   NA 142 11/21/2022 1315   K 3.5 11/21/2022 1315   CL 109 11/21/2022 1315   CO2 26 11/21/2022 1315   BUN 9 11/21/2022 1315   CREATININE 0.55 11/21/2022 1315   CREATININE 0.62 11/08/2022 1147      Component Value Date/Time   CALCIUM 8.0 (L) 11/21/2022 1315   ALKPHOS 93 11/21/2022 1315   AST 17 11/21/2022 1315   AST 24 11/08/2022 1147   ALT 14 11/21/2022 1315   ALT 27 11/08/2022 1147   BILITOT 0.8 11/21/2022 1315   BILITOT 0.6 11/08/2022 1147

## 2023-09-05 NOTE — Assessment & Plan Note (Signed)
I reviewed test result with her She has excellent response to aggressive vitamin B12 injection monthly Her B12 level is now within normal range After injection today, I will space out her treatment to every 3 months I will see her back in 1 year

## 2023-09-08 ENCOUNTER — Telehealth: Payer: Self-pay

## 2023-09-08 NOTE — Telephone Encounter (Signed)
Ms. Rance called in to follow up with the results of her urinalysis and urine culture results.

## 2023-09-09 ENCOUNTER — Inpatient Hospital Stay: Payer: MEDICAID

## 2023-09-09 ENCOUNTER — Inpatient Hospital Stay: Payer: MEDICAID | Admitting: Hematology and Oncology

## 2023-09-09 NOTE — Telephone Encounter (Signed)
Called Jordan [name], to ask if she still had any remaining pills from antibiotics. No answer and sent to voicemail.

## 2023-09-10 ENCOUNTER — Other Ambulatory Visit: Payer: Self-pay | Admitting: Radiation Oncology

## 2023-09-10 MED ORDER — FOSFOMYCIN TROMETHAMINE 3 G PO PACK: 3.0000 g | PACK | ORAL | 0 refills | Status: DC

## 2023-09-11 ENCOUNTER — Encounter: Payer: Self-pay | Admitting: Hematology and Oncology

## 2023-09-13 ENCOUNTER — Emergency Department (HOSPITAL_COMMUNITY): Payer: MEDICAID

## 2023-09-13 ENCOUNTER — Inpatient Hospital Stay (HOSPITAL_COMMUNITY): Payer: MEDICAID

## 2023-09-13 ENCOUNTER — Encounter (HOSPITAL_COMMUNITY): Payer: Self-pay | Admitting: *Deleted

## 2023-09-13 ENCOUNTER — Other Ambulatory Visit: Payer: Self-pay

## 2023-09-13 ENCOUNTER — Inpatient Hospital Stay (HOSPITAL_COMMUNITY)
Admission: EM | Admit: 2023-09-13 | Discharge: 2023-09-29 | DRG: 504 | Disposition: A | Discharge status: Skilled Nursing Facility | Payer: MEDICAID | Attending: Internal Medicine | Admitting: Internal Medicine

## 2023-09-13 DIAGNOSIS — Z8544 Personal history of malignant neoplasm of other female genital organs: Secondary | ICD-10-CM

## 2023-09-13 DIAGNOSIS — Z90710 Acquired absence of both cervix and uterus: Secondary | ICD-10-CM

## 2023-09-13 DIAGNOSIS — S93324A Dislocation of tarsometatarsal joint of right foot, initial encounter: Secondary | ICD-10-CM | POA: Diagnosis present

## 2023-09-13 DIAGNOSIS — R197 Diarrhea, unspecified: Secondary | ICD-10-CM | POA: Diagnosis not present

## 2023-09-13 DIAGNOSIS — S92311A Displaced fracture of first metatarsal bone, right foot, initial encounter for closed fracture: Principal | ICD-10-CM | POA: Diagnosis present

## 2023-09-13 DIAGNOSIS — M978XXA Periprosthetic fracture around other internal prosthetic joint, initial encounter: Secondary | ICD-10-CM | POA: Diagnosis present

## 2023-09-13 DIAGNOSIS — Z923 Personal history of irradiation: Secondary | ICD-10-CM | POA: Diagnosis not present

## 2023-09-13 DIAGNOSIS — S8264XA Nondisplaced fracture of lateral malleolus of right fibula, initial encounter for closed fracture: Secondary | ICD-10-CM | POA: Diagnosis present

## 2023-09-13 DIAGNOSIS — Z888 Allergy status to other drugs, medicaments and biological substances status: Secondary | ICD-10-CM | POA: Diagnosis not present

## 2023-09-13 DIAGNOSIS — Z79899 Other long term (current) drug therapy: Secondary | ICD-10-CM

## 2023-09-13 DIAGNOSIS — W010XXA Fall on same level from slipping, tripping and stumbling without subsequent striking against object, initial encounter: Secondary | ICD-10-CM | POA: Diagnosis present

## 2023-09-13 DIAGNOSIS — S82491A Other fracture of shaft of right fibula, initial encounter for closed fracture: Secondary | ICD-10-CM | POA: Diagnosis present

## 2023-09-13 DIAGNOSIS — S92321A Displaced fracture of second metatarsal bone, right foot, initial encounter for closed fracture: Secondary | ICD-10-CM | POA: Diagnosis present

## 2023-09-13 DIAGNOSIS — Y9389 Activity, other specified: Secondary | ICD-10-CM

## 2023-09-13 DIAGNOSIS — S89191A Other physeal fracture of lower end of right tibia, initial encounter for closed fracture: Secondary | ICD-10-CM | POA: Diagnosis present

## 2023-09-13 DIAGNOSIS — Z6841 Body Mass Index (BMI) 40.0 and over, adult: Secondary | ICD-10-CM | POA: Diagnosis not present

## 2023-09-13 DIAGNOSIS — F413 Other mixed anxiety disorders: Secondary | ICD-10-CM | POA: Diagnosis not present

## 2023-09-13 DIAGNOSIS — F319 Bipolar disorder, unspecified: Secondary | ICD-10-CM | POA: Diagnosis present

## 2023-09-13 DIAGNOSIS — D696 Thrombocytopenia, unspecified: Secondary | ICD-10-CM | POA: Diagnosis not present

## 2023-09-13 DIAGNOSIS — G894 Chronic pain syndrome: Secondary | ICD-10-CM | POA: Diagnosis not present

## 2023-09-13 DIAGNOSIS — Z882 Allergy status to sulfonamides status: Secondary | ICD-10-CM

## 2023-09-13 DIAGNOSIS — Z881 Allergy status to other antibiotic agents status: Secondary | ICD-10-CM

## 2023-09-13 DIAGNOSIS — G8929 Other chronic pain: Secondary | ICD-10-CM | POA: Diagnosis present

## 2023-09-13 DIAGNOSIS — S82201A Unspecified fracture of shaft of right tibia, initial encounter for closed fracture: Principal | ICD-10-CM | POA: Diagnosis present

## 2023-09-13 DIAGNOSIS — Y92018 Other place in single-family (private) house as the place of occurrence of the external cause: Secondary | ICD-10-CM | POA: Diagnosis not present

## 2023-09-13 DIAGNOSIS — S82401A Unspecified fracture of shaft of right fibula, initial encounter for closed fracture: Secondary | ICD-10-CM | POA: Diagnosis not present

## 2023-09-13 DIAGNOSIS — Z885 Allergy status to narcotic agent status: Secondary | ICD-10-CM

## 2023-09-13 DIAGNOSIS — M79604 Pain in right leg: Secondary | ICD-10-CM | POA: Diagnosis present

## 2023-09-13 DIAGNOSIS — Z9049 Acquired absence of other specified parts of digestive tract: Secondary | ICD-10-CM

## 2023-09-13 LAB — CBC WITH DIFFERENTIAL/PLATELET
Abs Immature Granulocytes: 0.05 10*3/uL (ref 0.00–0.07)
Basophils Absolute: 0 10*3/uL (ref 0.0–0.1)
Basophils Relative: 0 %
Eosinophils Absolute: 0 10*3/uL (ref 0.0–0.5)
Eosinophils Relative: 0 %
HCT: 41.5 % (ref 36.0–46.0)
Hemoglobin: 13.7 g/dL (ref 12.0–15.0)
Immature Granulocytes: 0 %
Lymphocytes Relative: 8 %
Lymphs Abs: 0.9 10*3/uL (ref 0.7–4.0)
MCH: 30.5 pg (ref 26.0–34.0)
MCHC: 33 g/dL (ref 30.0–36.0)
MCV: 92.4 fL (ref 80.0–100.0)
Monocytes Absolute: 0.8 10*3/uL (ref 0.1–1.0)
Monocytes Relative: 7 %
Neutro Abs: 9.6 10*3/uL — ABNORMAL HIGH (ref 1.7–7.7)
Neutrophils Relative %: 85 %
Platelets: 160 10*3/uL (ref 150–400)
RBC: 4.49 MIL/uL (ref 3.87–5.11)
RDW: 12.9 % (ref 11.5–15.5)
WBC: 11.3 10*3/uL — ABNORMAL HIGH (ref 4.0–10.5)
nRBC: 0 % (ref 0.0–0.2)

## 2023-09-13 LAB — BASIC METABOLIC PANEL
Anion gap: 8 (ref 5–15)
BUN: 8 mg/dL (ref 6–20)
CO2: 23 mmol/L (ref 22–32)
Calcium: 8.9 mg/dL (ref 8.9–10.3)
Chloride: 107 mmol/L (ref 98–111)
Creatinine, Ser: 0.71 mg/dL (ref 0.44–1.00)
GFR, Estimated: >60 mL/min (ref >60)
Glucose, Bld: 108 mg/dL — ABNORMAL HIGH (ref 70–99)
Potassium: 3.6 mmol/L (ref 3.5–5.1)
Sodium: 138 mmol/L (ref 135–145)

## 2023-09-13 MED ORDER — ONDANSETRON HCL 4 MG/2ML IJ SOLN
4.0000 mg | Freq: Once | INTRAMUSCULAR | Status: AC
  Administered 2023-09-13: 4 mg via INTRAVENOUS
  Filled 2023-09-13: qty 2

## 2023-09-13 MED ORDER — HYDROMORPHONE HCL 1 MG/ML IJ SOLN
2.0000 mg | Freq: Once | INTRAMUSCULAR | Status: AC
  Administered 2023-09-13: 2 mg via INTRAMUSCULAR
  Filled 2023-09-13: qty 2

## 2023-09-13 MED ORDER — HYDROMORPHONE HCL 1 MG/ML IJ SOLN
1.0000 mg | Freq: Once | INTRAMUSCULAR | Status: AC
  Administered 2023-09-13: 1 mg via INTRAVENOUS
  Filled 2023-09-13: qty 1

## 2023-09-13 MED ORDER — DIVALPROEX SODIUM ER 250 MG PO TB24
250.0000 mg | ORAL_TABLET | Freq: Every day | ORAL | Status: DC
  Administered 2023-09-14 – 2023-09-28 (×16): 250 mg via ORAL
  Filled 2023-09-13 (×19): qty 1

## 2023-09-13 MED ORDER — OXYCODONE HCL 5 MG PO TABS
15.0000 mg | ORAL_TABLET | ORAL | Status: DC | PRN
  Administered 2023-09-14 – 2023-09-29 (×49): 15 mg via ORAL
  Filled 2023-09-13 (×52): qty 3

## 2023-09-13 MED ORDER — HYDROMORPHONE HCL 1 MG/ML IJ SOLN
0.5000 mg | INTRAMUSCULAR | Status: DC | PRN
  Administered 2023-09-14 (×4): 0.5 mg via INTRAVENOUS
  Filled 2023-09-13 (×4): qty 0.5

## 2023-09-13 MED ORDER — OXYCODONE HCL 5 MG PO TABS
5.0000 mg | ORAL_TABLET | ORAL | Status: DC | PRN
  Filled 2023-09-13: qty 1

## 2023-09-13 MED ORDER — OXYCODONE HCL 5 MG PO TABS
15.0000 mg | ORAL_TABLET | Freq: Once | ORAL | Status: AC
  Administered 2023-09-13: 15 mg via ORAL
  Filled 2023-09-13: qty 3

## 2023-09-13 MED ORDER — VENLAFAXINE HCL ER 75 MG PO CP24
75.0000 mg | ORAL_CAPSULE | Freq: Every day | ORAL | Status: DC
  Administered 2023-09-14 – 2023-09-28 (×15): 75 mg via ORAL
  Filled 2023-09-13 (×17): qty 1

## 2023-09-13 NOTE — ED Triage Notes (Addendum)
PT here via [facility] for RLE pain r/t fall.  Deformity noted to dorsum of R foot and toes are not aligned.  PT states she must have tripped checking the mail.  States she receives percocet from a pain clinic.

## 2023-09-13 NOTE — Progress Notes (Signed)
Orthopedic Tech Progress Note Patient Details:  Jordan [name] [birth year] [id number]  Ortho Devices Type of Ortho Device: CAM walker Ortho Device/Splint Location: rle Ortho Device/Splint Interventions: Ordered, Application, Adjustment   Post Interventions Patient Tolerated: Well Instructions Provided: Care of device, Adjustment of device  Trinna Post 09/13/2023, 9:16 PM

## 2023-09-13 NOTE — Assessment & Plan Note (Addendum)
Seems like she's on chronic oxy for pain, will complicate pain control.

## 2023-09-13 NOTE — ED Notes (Signed)
ED TO INPATIENT HANDOFF REPORT  ED Nurse Name and Phone #: Gillis Ends [phone]  S Name/Age/Gender Jordan [name] 57 y.o. female Room/Bed: 039C/039C  Code Status   Code Status: Full Code  Home/SNF/Other Home Patient oriented to: self, place, time, and situation Is this baseline? Yes   Triage Complete: Triage complete  Chief Complaint Closed fracture of right tibia and fibula [S82.201A, S82.401A]  Triage Note PT here via [facility] for RLE pain r/t fall.  Deformity noted to dorsum of R foot and toes are not aligned.  PT states she must have tripped checking the mail.  States she receives percocet from a pain clinic.   Allergies Allergies  Allergen Reactions   Ceftriaxone Anaphylaxis    See progess note. Administration results in hives, throat pain, flushing and SOB.   Ciprofloxacin Swelling    Lips swell, tongue swells, face swells   Citalopram Other (See Comments)    Possible cause of pancytopenia. Swelling of tongue, face and throat   Lamotrigine Other (See Comments)    Possible cause of pancytopenia. Swelling of face, throat and tongue   Sulfa Antibiotics Anaphylaxis   Tramadol Anaphylaxis, Shortness Of Breath and Swelling    Level of Care/Admitting Diagnosis ED Disposition     ED Disposition  Admit   Condition  --   Comment  Hospital Area: [facility] [[id number]]  Level of Care: Med-Surg [16]  May admit patient to [facility] or [facility] if equivalent level of care is available:: No  Covid Evaluation: Asymptomatic - no recent exposure (last 10 days) testing not required  Diagnosis: Closed fracture of right tibia and fibula [621308]  Admitting Physician: Hillary Bow [[id number]]  Attending Physician: Hillary Bow [phone]  Certification:: I certify this patient is being admitted for an inpatient-only procedure  Expected Medical Readiness: 09/15/2023          B Medical/Surgery History Past Medical History:  Diagnosis Date   Alcohol abuse     Anemia    patient denies   Bipolar 1 disorder (HCC)    No medications currently   CAP (community acquired pneumonia) 03/17/2015   History of radiation therapy 08/24/19-09/22/19   Vaginal brachytherapy   Dr. 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   Mental disorder    Obesity    PICC line infection 05/17/2015   Sepsis due to Gram negative bacteria (MDR E Coli) 02/18/2015   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HCC)    UTI (lower urinary tract infection)    Vaginal Pap smear, abnormal    Past Surgical History:  Procedure Laterality Date   ABDOMINAL HYSTERECTOMY     CERVICAL CONIZATION W/BX N/A 07/01/2016   Procedure: CONIZATION CERVIX WITH BIOPSY;  Surgeon: Hermina Staggers, MD;  Location: [facility];  Service: Gynecology;  Laterality: N/A;   CHOLECYSTECTOMY     EXTERNAL FIXATION LEG Right 04/09/2020   Procedure: EXTERNAL FIXATION LEG;  Surgeon: Bjorn Pippin, MD;  Location: [facility];  Service: Orthopedics;  Laterality: Right;   HYSTEROSCOPY WITH D & C N/A 01/25/2016   Procedure: DILATATION AND CURETTAGE /HYSTEROSCOPY;  Surgeon: Catalina Antigua, MD;  Location: [facility];  Service: Gynecology;  Laterality: N/A;   LYMPH NODE BIOPSY Bilateral 07/22/2019   Procedure: LYMPH NODE BIOPSY;  Surgeon: Adolphus Birchwood, MD;  Location: [facility];  Service: Gynecology;  Laterality: Bilateral;   OPEN REDUCTION INTERNAL FIXATION (ORIF) TIBIA/FIBULA FRACTURE Right 04/10/2020   Procedure: OPEN REDUCTION INTERNAL FIXATION (ORIF) TIBIA/FIBULA  FRACTURE;  Surgeon: Roby Lofts, MD;  Location: [facility];  Service: Orthopedics;  Laterality: Right;   ROBOT ASSISTED MYOMECTOMY N/A 07/22/2019   Procedure: XI ROBOTIC ASSISTED LAPAROSCOPIC RADICAL UPPER VAGINECTOMY, LEFT SALPINECTOMY, RIGHT SALPINGOOOPHERECTOMY;  Surgeon: Adolphus Birchwood, MD;  Location: [facility];  Service: Gynecology;  Laterality: N/A;   VAGINAL HYSTERECTOMY N/A 03/11/2017   Procedure: HYSTERECTOMY VAGINAL WITH MORCELLATION;  Surgeon: Hermina Staggers, MD;  Location: [facility];  Service: Gynecology;  Laterality: N/A;     A IV Location/Drains/Wounds Patient Lines/Drains/Airways Status     Active Line/Drains/Airways     Name Placement date Placement time Site Days   Peripheral IV 09/13/23 22 G Left;Posterior Hand 09/13/23  2156  Hand  less than 1   External Urinary Catheter 09/13/23  1930  --  less than 1            Intake/Output Last 24 hours No intake or output data in the 24 hours ending 09/13/23 2319  Labs/Imaging Results for orders placed or performed during the hospital encounter of 09/13/23 (from the past 48 hour(s))  CBC with Differential     Status: Abnormal   Collection Time: 09/13/23  9:28 PM  Result Value Ref Range   WBC 11.3 (H) 4.0 - 10.5 K/uL   RBC 4.49 3.87 - 5.11 MIL/uL   Hemoglobin 13.7 12.0 - 15.0 g/dL   HCT 84.1 32.4 - 40.1 %   MCV 92.4 80.0 - 100.0 fL   MCH 30.5 26.0 - 34.0 pg   MCHC 33.0 30.0 - 36.0 g/dL   RDW 02.7 25.3 - 66.4 %   Platelets 160 150 - 400 K/uL   nRBC 0.0 0.0 - 0.2 %   Neutrophils Relative % 85 %   Neutro Abs 9.6 (H) 1.7 - 7.7 K/uL   Lymphocytes Relative 8 %   Lymphs Abs 0.9 0.7 - 4.0 K/uL   Monocytes Relative 7 %   Monocytes Absolute 0.8 0.1 - 1.0 K/uL   Eosinophils Relative 0 %   Eosinophils Absolute 0.0 0.0 - 0.5 K/uL   Basophils Relative 0 %   Basophils Absolute 0.0 0.0 - 0.1 K/uL   Immature Granulocytes 0 %   Abs Immature Granulocytes 0.05 0.00 - 0.07 K/uL    Comment: Performed at [facility], [address], [place], [place] [address]  Basic metabolic panel     Status: Abnormal   Collection Time: 09/13/23  9:28 PM  Result Value Ref Range   Sodium 138 135 - 145 mmol/L   Potassium 3.6 3.5 - 5.1 mmol/L   Chloride 107 98 - 111 mmol/L   CO2 23 22 - 32 mmol/L   Glucose, Bld 108 (H) 70 - 99 mg/dL    Comment: Glucose reference range applies only to samples taken after fasting for at least 8 hours.   BUN 8 6 - 20 mg/dL   Creatinine, Ser 4.25 0.44 - 1.00 mg/dL    Calcium 8.9 8.9 - 95.6 mg/dL   GFR, Estimated >38 >75 mL/min    Comment: (NOTE) Calculated using the CKD-EPI Creatinine Equation (2021)    Anion gap 8 5 - 15    Comment: Performed at [facility], [address], [place], [place] [address]   *Note: Due to a large number of results and/or encounters for the requested time period, some results have not been displayed. A complete set of results can be found in Results Review.   DG Foot Complete Right  Result Date:  09/13/2023 CLINICAL DATA:  Right lower extremity pain after fall. Deformity to dorsum of right foot and toes are not aligned. EXAM: RIGHT FOOT COMPLETE - 3+ VIEW; RIGHT TIBIA AND FIBULA - 2 VIEW COMPARISON:  Radiographs 04/26/2020 and 06/19/2015 FINDINGS: Plate screw fixation of the remote fracture of the distal right tibia. Remote fracture deformities of the tibia and fibula. Acute nondisplaced oblique fracture of the mid right fibula. Question additional nondisplaced fracture of the mid to distal right tibia. This is only seen on lateral view and may be acute, chronic, or projection artifact. Acute displaced fracture of the base of the first metatarsal. The fracture line extends into the tarsometatarsal joint and Lisfranc joint. There is dorsal and medial displacement of the dominant distal fragment. Soft tissue swelling over the dorsum of the foot. IMPRESSION: 1. Acute displaced fracture of the base of the first metatarsal. The fracture line extends into the tarsometatarsal joint and Lisfranc joint. 2. Acute nondisplaced oblique fracture of the mid right fibula. 3. Question acute nondisplaced fracture of the mid to distal right tibia. This is only seen on lateral view. Differential considerations include chronic fracture or projection artifact. Consider CT for further evaluation. Electronically Signed   By: Minerva Fester M.D.   On: 09/13/2023 19:06   DG Tibia/Fibula Right  Result Date: 09/13/2023 CLINICAL DATA:  Right lower  extremity pain after fall. Deformity to dorsum of right foot and toes are not aligned. EXAM: RIGHT FOOT COMPLETE - 3+ VIEW; RIGHT TIBIA AND FIBULA - 2 VIEW COMPARISON:  Radiographs 04/26/2020 and 06/19/2015 FINDINGS: Plate screw fixation of the remote fracture of the distal right tibia. Remote fracture deformities of the tibia and fibula. Acute nondisplaced oblique fracture of the mid right fibula. Question additional nondisplaced fracture of the mid to distal right tibia. This is only seen on lateral view and may be acute, chronic, or projection artifact. Acute displaced fracture of the base of the first metatarsal. The fracture line extends into the tarsometatarsal joint and Lisfranc joint. There is dorsal and medial displacement of the dominant distal fragment. Soft tissue swelling over the dorsum of the foot. IMPRESSION: 1. Acute displaced fracture of the base of the first metatarsal. The fracture line extends into the tarsometatarsal joint and Lisfranc joint. 2. Acute nondisplaced oblique fracture of the mid right fibula. 3. Question acute nondisplaced fracture of the mid to distal right tibia. This is only seen on lateral view. Differential considerations include chronic fracture or projection artifact. Consider CT for further evaluation. Electronically Signed   By: Minerva Fester M.D.   On: 09/13/2023 19:06    Pending Labs Unresulted Labs (From admission, onward)     Start     Ordered   09/13/23 2304  HIV Antibody (routine testing w rflx)  (HIV Antibody (Routine testing w reflex) panel)  Once,   R        09/13/23 2306            Vitals/Pain Today's Vitals   09/13/23 2200 09/13/23 2215 09/13/23 2232 09/13/23 2305  BP: 102/78 (!) 148/126    Pulse: 82 85  67  Resp:   16 18  Temp:   99 F (37.2 C)   TempSrc:   Oral   SpO2: 99% 99%  100%  Weight:      Height:      PainSc:        Isolation Precautions No active isolations  Medications Medications  ondansetron (ZOFRAN) injection 4  mg (has no administration in  time range)  HYDROmorphone (DILAUDID) injection 0.5 mg (has no administration in time range)  oxyCODONE (Oxy IR/ROXICODONE) immediate release tablet 5-10 mg (has no administration in time range)  oxyCODONE (Oxy IR/ROXICODONE) immediate release tablet 15 mg (15 mg Oral Given 09/13/23 1717)  HYDROmorphone (DILAUDID) injection 2 mg (2 mg Intramuscular Given 09/13/23 2015)  HYDROmorphone (DILAUDID) injection 1 mg (1 mg Intravenous Given 09/13/23 2228)    Mobility non-ambulatory     Focused Assessments     R Recommendations: See Admitting Provider Note  Report given to:   Additional Notes:

## 2023-09-13 NOTE — ED Notes (Signed)
Requested pain meds from MD

## 2023-09-13 NOTE — ED Provider Notes (Signed)
[place] EMERGENCY DEPARTMENT AT [facility] Provider Note   CSN: [id number] Arrival date & time: 09/13/23  1618     History  Chief Complaint  Patient presents with   Marletta Lor    Jordan [name] is a 57 y.o. female.  HPI    57 year old female comes in with chief complaint of mechanical fall. Patient has previous history of orthopedic surgery to her right lower extremity.  She states that she was in a rush, tripped and injured her right lower extremity in the process.  She does not have pain anywhere else besides her right leg and foot.  Patient does not take any blood thinners.  She denies any headache, head trauma, one-sided weakness, numbness, slurred speech.  Home Medications Prior to Admission medications   Medication Sig Start Date End Date Taking? Authorizing Provider  conjugated estrogens (PREMARIN) vaginal cream Place 1 Applicatorful vaginally 2 (two) times a week. Place 0.5g nightly for two weeks then twice a week after 06/19/23   Selmer Dominion, NP  estradiol (VIVELLE-DOT) 0.075 MG/24HR Place 1 patch onto the skin 2 (two) times a week. 07/31/23   Federico Flake, MD  Estradiol 10 MCG TABS vaginal tablet Place 1 tablet (10 mcg total) vaginally at bedtime. Nightly at bedtime for 2 weeks then 2 nights a week afterwards 08/20/23   Lorriane Shire, MD  fosfomycin (MONUROL) 3 g PACK Take 3 g by mouth once a week. Dissolve packet in 3-4 oz of water and drink immediately. 09/10/23   Antony Blackbird, MD  gabapentin (NEURONTIN) 800 MG tablet Take 800 mg by mouth 3 (three) times daily. 01/11/22   [provider]  metroNIDAZOLE (METROGEL) 0.75 % vaginal gel Place 1 Applicatorful vaginally at bedtime. Apply one applicatorful to vagina at bedtime for 10 days, then every 3 days for 1 month and then weekly for 6 months. 07/30/23   Federico Flake, MD  naproxen (NAPROSYN) 500 MG tablet Take 500 mg by mouth 2 (two) times daily as needed.    [provider]  Oxycodone HCl 10 MG TABS Take 10 mg by mouth daily as needed. 01/27/20   [provider]  phenazopyridine (PYRIDIUM) 200 MG tablet Take 1 tablet (200 mg total) by mouth 3 (three) times daily as needed for pain. 07/04/23   Loleta Chance, MD  Vibegron (GEMTESA) 75 MG TABS Take 1 tablet (75 mg total) by mouth daily. 07/04/23   Loleta Chance, MD  Vibegron (GEMTESA) 75 MG TABS Take 1 tablet (75 mg total) by mouth daily. 07/04/23   Loleta Chance, MD      Allergies    Ceftriaxone, Ciprofloxacin, Citalopram, Lamotrigine, Sulfa antibiotics, and Tramadol    Review of Systems   Review of Systems  All other systems reviewed and are negative.   Physical Exam Updated Vital Signs BP (!) 115/58   Pulse 78   Temp 98.6 F (37 C) (Oral)   Resp 16   Ht 5\' 7"  (1.702 m)   Wt 127.4 kg   LMP  (LMP Unknown)   SpO2 100%   BMI 43.98 kg/m  Physical Exam Vitals and nursing note reviewed.  Constitutional:      Appearance: She is well-developed.  HENT:     Head: Atraumatic.  Neck:     Comments: No midline c-spine tenderness, pt able to turn head to 45 degrees bilaterally without any pain and able to flex neck to the chest and extend without any pain or neurologic symptoms.  Cardiovascular:     Rate and Rhythm: Normal rate.  Pulmonary:     Effort: Pulmonary effort is normal.  Musculoskeletal:        General: Swelling, tenderness and deformity present.     Cervical back: Normal range of motion and neck supple.     Comments: Right toe appears to be slightly deformed/angulated Patient has tenderness to palpation over the right distal leg and right anterior mid MID foot   Skin:    General: Skin is warm and dry.  Neurological:     Mental Status: She is alert and oriented to person, place, and time.     ED Results / Procedures / Treatments   Labs (all labs ordered are listed, but only abnormal results are displayed) Labs Reviewed  CBC WITH DIFFERENTIAL/PLATELET  BASIC METABOLIC PANEL     EKG None  Radiology DG Foot Complete Right  Result Date: 09/13/2023 CLINICAL DATA:  Right lower extremity pain after fall. Deformity to dorsum of right foot and toes are not aligned. EXAM: RIGHT FOOT COMPLETE - 3+ VIEW; RIGHT TIBIA AND FIBULA - 2 VIEW COMPARISON:  Radiographs 04/26/2020 and 06/19/2015 FINDINGS: Plate screw fixation of the remote fracture of the distal right tibia. Remote fracture deformities of the tibia and fibula. Acute nondisplaced oblique fracture of the mid right fibula. Question additional nondisplaced fracture of the mid to distal right tibia. This is only seen on lateral view and may be acute, chronic, or projection artifact. Acute displaced fracture of the base of the first metatarsal. The fracture line extends into the tarsometatarsal joint and Lisfranc joint. There is dorsal and medial displacement of the dominant distal fragment. Soft tissue swelling over the dorsum of the foot. IMPRESSION: 1. Acute displaced fracture of the base of the first metatarsal. The fracture line extends into the tarsometatarsal joint and Lisfranc joint. 2. Acute nondisplaced oblique fracture of the mid right fibula. 3. Question acute nondisplaced fracture of the mid to distal right tibia. This is only seen on lateral view. Differential considerations include chronic fracture or projection artifact. Consider CT for further evaluation. Electronically Signed   By: Minerva Fester M.D.   On: 09/13/2023 19:06   DG Tibia/Fibula Right  Result Date: 09/13/2023 CLINICAL DATA:  Right lower extremity pain after fall. Deformity to dorsum of right foot and toes are not aligned. EXAM: RIGHT FOOT COMPLETE - 3+ VIEW; RIGHT TIBIA AND FIBULA - 2 VIEW COMPARISON:  Radiographs 04/26/2020 and 06/19/2015 FINDINGS: Plate screw fixation of the remote fracture of the distal right tibia. Remote fracture deformities of the tibia and fibula. Acute nondisplaced oblique fracture of the mid right fibula. Question additional  nondisplaced fracture of the mid to distal right tibia. This is only seen on lateral view and may be acute, chronic, or projection artifact. Acute displaced fracture of the base of the first metatarsal. The fracture line extends into the tarsometatarsal joint and Lisfranc joint. There is dorsal and medial displacement of the dominant distal fragment. Soft tissue swelling over the dorsum of the foot. IMPRESSION: 1. Acute displaced fracture of the base of the first metatarsal. The fracture line extends into the tarsometatarsal joint and Lisfranc joint. 2. Acute nondisplaced oblique fracture of the mid right fibula. 3. Question acute nondisplaced fracture of the mid to distal right tibia. This is only seen on lateral view. Differential considerations include chronic fracture or projection artifact. Consider CT for further evaluation. Electronically Signed   By: Minerva Fester M.D.   On: 09/13/2023 19:06  Procedures Procedures    Medications Ordered in ED Medications  oxyCODONE (Oxy IR/ROXICODONE) immediate release tablet 15 mg (15 mg Oral Given 09/13/23 1717)  HYDROmorphone (DILAUDID) injection 2 mg (2 mg Intramuscular Given 09/13/23 2015)    ED Course/ Medical Decision Making/ A&P                                 Medical Decision Making Amount and/or Complexity of Data Reviewed Labs: ordered. Radiology: ordered.  Risk Prescription drug management.   57year old patient comes in after sustaining what appears to be a mechanical fall. Pertinent past medical includes : Chronic pain syndrome, previous surgery to the right leg. I reviewed patient's records.  It appears that Dr. Jena Gauss, orthopedic surgery was helping her before.  Based on my history and exam, differential diagnosis includes: - Long bone fractures - Contusions - Soft tissue injury -Ankle sprain/strain  Based on the initial assessment, the following workup was initiated x-ray of the foot and tibia.  I feel comfortable  clearing the brain and C-spine clinically.  I have independently interpreted the following imaging from the perspective of acute trauma: X-ray of the foot and the results indicate evidence of fracture at the base of the metatarsal.  This explains the deformity we see on gross exam.  We will consult general orthopedic surgery team.  Results discussed with the patient. She cannot walk, lives at home. Has BMI that is elevated. Will need admission for pain control.  Final Clinical Impression(s) / ED Diagnoses Final diagnoses:  None    Rx / DC Orders ED Discharge Orders     None         Derwood Kaplan, MD 09/13/23 2030

## 2023-09-13 NOTE — Assessment & Plan Note (Addendum)
Consider starting pt on Zepbound or Wegovy as outpt if these can be made financially viable.

## 2023-09-13 NOTE — ED Provider Notes (Signed)
  Physical Exam  BP 118/67   Pulse 82   Temp 98.6 F (37 C) (Oral)   Resp 16   Ht 5\' 7"  (1.702 m)   Wt 127.4 kg   LMP  (LMP Unknown)   SpO2 99%   BMI 43.98 kg/m   Physical Exam  Procedures  Procedures  ED Course / MDM    Medical Decision Making Care assumed at 8 PM.  Patient is here after a fall.  Patient had acute fracture of the first metatarsal and distal right fibula.  Signout pending Ortho consult and medicine admission for pain control  9 pm Discussed case with Dr. Eulah Pont who is covering Dr. Jena Gauss.  He recommend a boot and Ortho will see patient tomorrow to see if she needs another operation or treat conservatively.  He agreed with admission for pain control and request medicine service as she has multiple medical problems   Amount and/or Complexity of Data Reviewed Labs: ordered. Radiology: ordered.  Risk Prescription drug management.         Charlynne Pander, MD 09/16/23 [phone]

## 2023-09-13 NOTE — Assessment & Plan Note (Addendum)
Displaced R first metatarsal fx. Non-displaced preiprosthetic R tibia fx R fibula fx EDP spoke with ortho: EDP reports to me that these fractures are potentially surgical per his discussion with ortho NPO after MN Dr. Eulah Pont to see pt in AM Pain control complicated by chronic oxy use: Oxy 15mg  Q4H PRN (takes up to QID for pain at home) Dilaudid 0.5mg  IV Q2H PRN breakthrough pain Hip fx pathway Delay DVT ppx due to planned OR for repair in AM Gupta score = 0.5% (pt technically ASA 3 though only due to BMI > 40)

## 2023-09-13 NOTE — H&P (Signed)
History and Physical    Patient: Jordan [name] WUJ:[id number] DOB: 06/24/1966 DOA: 09/13/2023 DOS: the patient was seen and examined on 09/13/2023 PCP: Courtney Paris, NP  Patient coming from: Home  Chief Complaint:  Chief Complaint  Patient presents with   Fall   HPI: Jordan [name] is a 57 y.o. female with medical history significant of BPD1, CPS, morbid obesity.  Pt in to ED after mechanical fall.  Injured R lower leg and foot in fall.  Inability to bear wt or ambulate following fall.  Pain severe.  Review of Systems: As mentioned in the history of present illness. All other systems reviewed and are negative. Past Medical History:  Diagnosis Date   Alcohol abuse    Anemia    patient denies   Bipolar 1 disorder (HCC)    No medications currently   CAP (community acquired pneumonia) 03/17/2015   History of radiation therapy 08/24/19-09/22/19   Vaginal brachytherapy   Dr. 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   Mental disorder    Obesity    PICC line infection 05/17/2015   Sepsis due to Gram negative bacteria (MDR E Coli) 02/18/2015   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HCC)    UTI (lower urinary tract infection)    Vaginal Pap smear, abnormal    Past Surgical History:  Procedure Laterality Date   ABDOMINAL HYSTERECTOMY     CERVICAL CONIZATION W/BX N/A 07/01/2016   Procedure: CONIZATION CERVIX WITH BIOPSY;  Surgeon: Hermina Staggers, MD;  Location: [facility];  Service: Gynecology;  Laterality: N/A;   CHOLECYSTECTOMY     EXTERNAL FIXATION LEG Right 04/09/2020   Procedure: EXTERNAL FIXATION LEG;  Surgeon: Bjorn Pippin, MD;  Location: [facility];  Service: Orthopedics;  Laterality: Right;   HYSTEROSCOPY WITH D & C N/A 01/25/2016   Procedure: DILATATION AND CURETTAGE /HYSTEROSCOPY;  Surgeon: Catalina Antigua, MD;  Location: [facility];  Service: Gynecology;  Laterality: N/A;   LYMPH NODE BIOPSY Bilateral 07/22/2019   Procedure: LYMPH NODE BIOPSY;  Surgeon:  Adolphus Birchwood, MD;  Location: [facility];  Service: Gynecology;  Laterality: Bilateral;   OPEN REDUCTION INTERNAL FIXATION (ORIF) TIBIA/FIBULA FRACTURE Right 04/10/2020   Procedure: OPEN REDUCTION INTERNAL FIXATION (ORIF) TIBIA/FIBULA FRACTURE;  Surgeon: Roby Lofts, MD;  Location: [facility];  Service: Orthopedics;  Laterality: Right;   ROBOT ASSISTED MYOMECTOMY N/A 07/22/2019   Procedure: XI ROBOTIC ASSISTED LAPAROSCOPIC RADICAL UPPER VAGINECTOMY, LEFT SALPINECTOMY, RIGHT SALPINGOOOPHERECTOMY;  Surgeon: Adolphus Birchwood, MD;  Location: [facility];  Service: Gynecology;  Laterality: N/A;   VAGINAL HYSTERECTOMY N/A 03/11/2017   Procedure: HYSTERECTOMY VAGINAL WITH MORCELLATION;  Surgeon: Hermina Staggers, MD;  Location: [facility];  Service: Gynecology;  Laterality: N/A;   Social History:  reports that she has never smoked. She has never used smokeless tobacco. She reports that she does not currently use alcohol. She reports that she does not use drugs.  Allergies  Allergen Reactions   Ceftriaxone Anaphylaxis    See progess note. Administration results in hives, throat pain, flushing and SOB.   Ciprofloxacin Swelling    Lips swell, tongue swells, face swells   Citalopram Other (See Comments)    Possible cause of pancytopenia. Swelling of tongue, face and throat   Lamotrigine Other (See Comments)    Possible cause of pancytopenia. Swelling of face, throat and tongue   Sulfa Antibiotics Anaphylaxis   Tramadol Anaphylaxis, Shortness Of Breath and Swelling    Family History  Problem Relation Age  of Onset   Alcohol abuse Father    Cancer Father    Breast cancer Neg Hx     Prior to Admission medications   Medication Sig Start Date End Date Taking? Authorizing Provider  conjugated estrogens (PREMARIN) vaginal cream Place 1 Applicatorful vaginally 2 (two) times a week. Place 0.5g nightly for two weeks then twice a week after 06/19/23   Selmer Dominion, NP  estradiol (VIVELLE-DOT) 0.075 MG/24HR Place 1 patch onto  the skin 2 (two) times a week. 07/31/23   Federico Flake, MD  Estradiol 10 MCG TABS vaginal tablet Place 1 tablet (10 mcg total) vaginally at bedtime. Nightly at bedtime for 2 weeks then 2 nights a week afterwards 08/20/23   Lorriane Shire, MD  fosfomycin (MONUROL) 3 g PACK Take 3 g by mouth once a week. Dissolve packet in 3-4 oz of water and drink immediately. 09/10/23   Antony Blackbird, MD  gabapentin (NEURONTIN) 800 MG tablet Take 800 mg by mouth 3 (three) times daily. 01/11/22   [provider]  metroNIDAZOLE (METROGEL) 0.75 % vaginal gel Place 1 Applicatorful vaginally at bedtime. Apply one applicatorful to vagina at bedtime for 10 days, then every 3 days for 1 month and then weekly for 6 months. 07/30/23   Federico Flake, MD  naproxen (NAPROSYN) 500 MG tablet Take 500 mg by mouth 2 (two) times daily as needed.    [provider]  Oxycodone HCl 10 MG TABS Take 10 mg by mouth daily as needed. 01/27/20   [provider]  phenazopyridine (PYRIDIUM) 200 MG tablet Take 1 tablet (200 mg total) by mouth 3 (three) times daily as needed for pain. 07/04/23   Loleta Chance, MD  Vibegron (GEMTESA) 75 MG TABS Take 1 tablet (75 mg total) by mouth daily. 07/04/23   Loleta Chance, MD  Vibegron (GEMTESA) 75 MG TABS Take 1 tablet (75 mg total) by mouth daily. 07/04/23   Loleta Chance, MD    Physical Exam: Vitals:   09/13/23 2200 09/13/23 2215 09/13/23 2232 09/13/23 2305  BP: 102/78 (!) 148/126    Pulse: 82 85  67  Resp:   16 18  Temp:   99 F (37.2 C)   TempSrc:   Oral   SpO2: 99% 99%  100%  Weight:      Height:       Constitutional: NAD, calm, comfortable Respiratory: clear to auscultation bilaterally, no wheezing, no crackles. Normal respiratory effort. No accessory muscle use.  Cardiovascular: Regular rate and rhythm, no murmurs / rubs / gallops. No extremity edema. 2+ pedal pulses. No carotid bruits.  Abdomen: no tenderness, no masses palpated. No  hepatosplenomegaly. Bowel sounds positive.  Musculoskeletal: R foot and ankle in boot Neurologic: CN 2-12 grossly intact. Sensation intact, DTR normal. Strength 5/5 in all 4.  Psychiatric: Normal judgment and insight. Alert and oriented x 3. Normal mood.   Data Reviewed:    Labs on Admission: I have personally reviewed following labs and imaging studies  CBC: Recent Labs  Lab 09/13/23 2128  WBC 11.3*  NEUTROABS 9.6*  HGB 13.7  HCT 41.5  MCV 92.4  PLT 160   Basic Metabolic Panel: Recent Labs  Lab 09/13/23 2128  NA 138  K 3.6  CL 107  CO2 23  GLUCOSE 108*  BUN 8  CREATININE 0.71  CALCIUM 8.9   GFR: Estimated Creatinine Clearance: 110.3 mL/min (by C-G formula based on SCr of 0.71 mg/dL). Liver Function Tests: No results  for input(s): "AST", "ALT", "ALKPHOS", "BILITOT", "PROT", "ALBUMIN" in the last 168 hours. No results for input(s): "LIPASE", "AMYLASE" in the last 168 hours. No results for input(s): "AMMONIA" in the last 168 hours. Coagulation Profile: No results for input(s): "INR", "PROTIME" in the last 168 hours. Cardiac Enzymes: No results for input(s): "CKTOTAL", "CKMB", "CKMBINDEX", "TROPONINI" in the last 168 hours. BNP (last 3 results) No results for input(s): "PROBNP" in the last 8760 hours. HbA1C: No results for input(s): "HGBA1C" in the last 72 hours. CBG: No results for input(s): "GLUCAP" in the last 168 hours. Lipid Profile: No results for input(s): "CHOL", "HDL", "LDLCALC", "TRIG", "CHOLHDL", "LDLDIRECT" in the last 72 hours. Thyroid Function Tests: No results for input(s): "TSH", "T4TOTAL", "FREET4", "T3FREE", "THYROIDAB" in the last 72 hours. Anemia Panel: No results for input(s): "VITAMINB12", "FOLATE", "FERRITIN", "TIBC", "IRON", "RETICCTPCT" in the last 72 hours. Urine analysis:    Component Value Date/Time   COLORURINE YELLOW 09/01/2023 1332   APPEARANCEUR HAZY (A) 09/01/2023 1332   LABSPEC 1.024 09/01/2023 1332   PHURINE 5.0 09/01/2023  1332   GLUCOSEU NEGATIVE 09/01/2023 1332   HGBUR SMALL (A) 09/01/2023 1332   BILIRUBINUR NEGATIVE 09/01/2023 1332   BILIRUBINUR Negative 07/04/2023 1353   KETONESUR NEGATIVE 09/01/2023 1332   PROTEINUR NEGATIVE 09/01/2023 1332   UROBILINOGEN 0.2 07/04/2023 1353   UROBILINOGEN 1.0 10/31/2022 1524   NITRITE POSITIVE (A) 09/01/2023 1332   LEUKOCYTESUR NEGATIVE 09/01/2023 1332    Radiological Exams on Admission: DG Foot Complete Right  Result Date: 09/13/2023 CLINICAL DATA:  Right lower extremity pain after fall. Deformity to dorsum of right foot and toes are not aligned. EXAM: RIGHT FOOT COMPLETE - 3+ VIEW; RIGHT TIBIA AND FIBULA - 2 VIEW COMPARISON:  Radiographs 04/26/2020 and 06/19/2015 FINDINGS: Plate screw fixation of the remote fracture of the distal right tibia. Remote fracture deformities of the tibia and fibula. Acute nondisplaced oblique fracture of the mid right fibula. Question additional nondisplaced fracture of the mid to distal right tibia. This is only seen on lateral view and may be acute, chronic, or projection artifact. Acute displaced fracture of the base of the first metatarsal. The fracture line extends into the tarsometatarsal joint and Lisfranc joint. There is dorsal and medial displacement of the dominant distal fragment. Soft tissue swelling over the dorsum of the foot. IMPRESSION: 1. Acute displaced fracture of the base of the first metatarsal. The fracture line extends into the tarsometatarsal joint and Lisfranc joint. 2. Acute nondisplaced oblique fracture of the mid right fibula. 3. Question acute nondisplaced fracture of the mid to distal right tibia. This is only seen on lateral view. Differential considerations include chronic fracture or projection artifact. Consider CT for further evaluation. Electronically Signed   By: Minerva Fester M.D.   On: 09/13/2023 19:06   DG Tibia/Fibula Right  Result Date: 09/13/2023 CLINICAL DATA:  Right lower extremity pain after  fall. Deformity to dorsum of right foot and toes are not aligned. EXAM: RIGHT FOOT COMPLETE - 3+ VIEW; RIGHT TIBIA AND FIBULA - 2 VIEW COMPARISON:  Radiographs 04/26/2020 and 06/19/2015 FINDINGS: Plate screw fixation of the remote fracture of the distal right tibia. Remote fracture deformities of the tibia and fibula. Acute nondisplaced oblique fracture of the mid right fibula. Question additional nondisplaced fracture of the mid to distal right tibia. This is only seen on lateral view and may be acute, chronic, or projection artifact. Acute displaced fracture of the base of the first metatarsal. The fracture line extends into the tarsometatarsal joint  and Lisfranc joint. There is dorsal and medial displacement of the dominant distal fragment. Soft tissue swelling over the dorsum of the foot. IMPRESSION: 1. Acute displaced fracture of the base of the first metatarsal. The fracture line extends into the tarsometatarsal joint and Lisfranc joint. 2. Acute nondisplaced oblique fracture of the mid right fibula. 3. Question acute nondisplaced fracture of the mid to distal right tibia. This is only seen on lateral view. Differential considerations include chronic fracture or projection artifact. Consider CT for further evaluation. Electronically Signed   By: Minerva Fester M.D.   On: 09/13/2023 19:06    EKG: Independently reviewed.   Assessment and Plan: * Closed fracture of right tibia and fibula Displaced R first metatarsal fx. Non-displaced preiprosthetic R tibia fx R fibula fx EDP spoke with ortho: EDP reports to me that these fractures are potentially surgical per his discussion with ortho NPO after MN Dr. Eulah Pont to see pt in AM Pain control complicated by chronic oxy use: Oxy 15mg  Q4H PRN (takes up to QID for pain at home) Dilaudid 0.5mg  IV Q2H PRN breakthrough pain Hip fx pathway Delay DVT ppx due to planned OR for repair in AM Gupta score = 0.5% (pt technically ASA 3 though only due to BMI >  40)  Chronic pain Seems like she's on chronic oxy for pain, will complicate pain control.  Morbid obesity with BMI of 40.0-44.9, adult (HCC) Consider starting pt on Zepbound or Wegovy as outpt if these can be made financially viable.      Advance Care Planning:   Code Status: Full Code  Consults: Dr. Eulah Pont  Family Communication: No family in room  Severity of Illness: The appropriate patient status for this patient is INPATIENT. Inpatient status is judged to be reasonable and necessary in order to provide the required intensity of service to ensure the patient's safety. The patient's presenting symptoms, physical exam findings, and initial radiographic and laboratory data in the context of their chronic comorbidities is felt to place them at high risk for further clinical deterioration. Furthermore, it is not anticipated that the patient will be medically stable for discharge from the hospital within 2 midnights of admission.   * I certify that at the point of admission it is my clinical judgment that the patient will require inpatient hospital care spanning beyond 2 midnights from the point of admission due to high intensity of service, high risk for further deterioration and high frequency of surveillance required.*  Author: Hillary Bow., DO 09/13/2023 11:24 PM  For on call review [email or web address].

## 2023-09-14 ENCOUNTER — Inpatient Hospital Stay (HOSPITAL_COMMUNITY): Payer: MEDICAID

## 2023-09-14 DIAGNOSIS — S82201A Unspecified fracture of shaft of right tibia, initial encounter for closed fracture: Secondary | ICD-10-CM

## 2023-09-14 DIAGNOSIS — S92311A Displaced fracture of first metatarsal bone, right foot, initial encounter for closed fracture: Secondary | ICD-10-CM

## 2023-09-14 DIAGNOSIS — S82401A Unspecified fracture of shaft of right fibula, initial encounter for closed fracture: Secondary | ICD-10-CM

## 2023-09-14 LAB — HIV ANTIBODY (ROUTINE TESTING W REFLEX): HIV Screen 4th Generation wRfx: NONREACTIVE

## 2023-09-14 LAB — SURGICAL PCR SCREEN
MRSA, PCR: NEGATIVE
Staphylococcus aureus: NEGATIVE

## 2023-09-14 MED ORDER — LACTATED RINGERS IV BOLUS
500.0000 mL | Freq: Once | INTRAVENOUS | Status: AC
  Administered 2023-09-14: 500 mL via INTRAVENOUS

## 2023-09-14 MED ORDER — HYDROMORPHONE HCL 1 MG/ML IJ SOLN
1.0000 mg | INTRAMUSCULAR | Status: DC | PRN
  Administered 2023-09-15: 1 mg via INTRAVENOUS
  Filled 2023-09-14: qty 1

## 2023-09-14 MED ORDER — ADULT MULTIVITAMIN W/MINERALS CH
1.0000 | ORAL_TABLET | Freq: Every day | ORAL | Status: DC
  Administered 2023-09-14 – 2023-09-29 (×15): 1 via ORAL
  Filled 2023-09-14 (×15): qty 1

## 2023-09-14 MED ORDER — ENSURE ENLIVE PO LIQD
237.0000 mL | Freq: Two times a day (BID) | ORAL | Status: DC
  Administered 2023-09-14 – 2023-09-29 (×24): 237 mL via ORAL

## 2023-09-14 NOTE — Progress Notes (Signed)
Verbal order to place diet per PA [facility]

## 2023-09-14 NOTE — ED Notes (Addendum)
As per Dr Laban Emperor DO send per pt up to unit with current VS. IVF bolus in progress, O2 via [place] @ 2L/min.

## 2023-09-14 NOTE — ED Notes (Addendum)
Pt BP low Laban Emperor DO notified, pt seen at bedside. IVF bolus started

## 2023-09-14 NOTE — Progress Notes (Signed)
Patient received from ED after mechanical fall resulting closed fracture of right tibia and fibula. Alert and oriented X4, Vitals during the admission with blood pressure at lower side 114/54. Skin assessment with small skin break down at back. Dressing was done with mepilex. Patient came with home medicine (oxycodone and vitamin D) which is stored in pharmacy. Will continue to monitor the patient.

## 2023-09-14 NOTE — Progress Notes (Signed)
Initial Nutrition Assessment  DOCUMENTATION CODES:   Morbid obesity  INTERVENTION:  Continue current diet Ensure Plus High Protein po BID, each supplement provides 350 kcal and 20 grams of protein. Multivitamin/minerals   NUTRITION DIAGNOSIS:   Increased nutrient needs related to hip fracture as evidenced by estimated needs.    GOAL:   Patient will meet greater than or equal to 90% of their needs    MONITOR:   PO intake, Supplement acceptance, Weight trends, Labs  REASON FOR ASSESSMENT:   Consult Hip fracture protocol  ASSESSMENT:   57 y.o. Admitted related to mechanical fall injuring R lower leg, foot and hip. PMH: BPD1, CPS, morbid obesity. Patient not answer phone. All information through EMR and team. Patient noted to have good appetite independent feeding ability, Ambulates on own. No weight loss noted   Admit weight: 127.4 kg  Current weight: 127.4 kg  Weight history: 09/13/23 127.4 kg  09/05/23 127.4 kg  08/18/23 126.4 kg  07/30/23 126 kg  06/19/23 124.1 kg  05/12/23 125.1 kg  03/14/23 120.8 kg  03/13/23 120.1 kg  01/27/23 111.1 kg  01/16/23 111.1 kg       Average Meal Intake: Pending has been NPO for procedure today.   Nutritionally Relevant Medications: Scheduled Meds:  divalproex  250 mg Oral QHS   venlafaxine XR  75 mg Oral Q breakfast     Labs Reviewed  NUTRITION - FOCUSED PHYSICAL EXAM:  Deferred  Diet Order:   Diet Order             Diet regular Fluid consistency: Thin  Diet effective now                   EDUCATION NEEDS:   No education needs have been identified at this time  Skin:  Skin Assessment: Reviewed RN Assessment  Last BM:  PTA  Height:   Ht Readings from Last 1 Encounters:  09/13/23 5\' 7"  (1.702 m)    Weight:   Wt Readings from Last 1 Encounters:  09/13/23 127.4 kg    Ideal Body Weight:     BMI:  Body mass index is 43.98 kg/m.  Estimated Nutritional Needs:   Kcal:  1850-2150  kcal/d  Protein:  80-120 g/d  Fluid:  19ml/kcal    Jamelle Haring RDN, LDN Clinical Dietitian  RDN pager # available on Amion

## 2023-09-14 NOTE — Progress Notes (Signed)
[facility]  PROGRESS NOTE  Jordan [name] ZOX:[id number] DOB: 01-02-1966 DOA: 09/13/2023 PCP: Jordan Paris, NP   Brief HPI:   57 y.o. female with medical history significant of BPD1, CPS, morbid obesity.   Pt in to ED after mechanical fall.  Injured R lower leg and foot in fall.  Inability to bear wt or ambulate following fall.  Pain severe.    Assessment/Plan:    Closed fracture of right tibia and fibula Displaced R first metatarsal fx. Non-displaced preiprosthetic R tibia fx, right fibula fracture -Orthopedics consulted -Likely need surgical fixation -Continue pain management with oxycodone 15 mg every 4 hours as needed -Continue Dilaudid 0.5 mg IV every 2 hours as needed  Gupta score = 0.5% (pt technically ASA 3 though only due to BMI > 40)   Chronic pain Seems like she's on chronic oxy for pain, will complicate pain control.   Morbid obesity with BMI of 40.0-44.9, adult (HCC) Consider starting pt on Zepbound or Wegovy as outpt if these can be made financially viable.      Medications     divalproex  250 mg Oral QHS   venlafaxine XR  75 mg Oral Q breakfast     Data Reviewed:   CBG:  No results for input(s): "GLUCAP" in the last 168 hours.  SpO2: 100 % O2 Flow Rate (L/min): 2 L/min    Vitals:   09/14/23 0104 09/14/23 0536 09/14/23 0750 09/14/23 0937  BP: (!) 114/54 (!) 128/57 125/68 103/65  Pulse: 81 83  79  Resp: 20 18 18 15   Temp: 98 F (36.7 C) 98.3 F (36.8 C)  98.2 F (36.8 C)  TempSrc: Oral   Oral  SpO2: 94% 100%  100%  Weight:      Height:          Data Reviewed:  Basic Metabolic Panel: Recent Labs  Lab 09/13/23 2128  NA 138  K 3.6  CL 107  CO2 23  GLUCOSE 108*  BUN 8  CREATININE 0.71  CALCIUM 8.9    CBC: Recent Labs  Lab 09/13/23 2128  WBC 11.3*  NEUTROABS 9.6*  HGB 13.7  HCT 41.5  MCV 92.4  PLT 160    LFT No results for input(s): "AST", "ALT", "ALKPHOS", "BILITOT", "PROT", "ALBUMIN" in the last 168  hours.   Antibiotics: Anti-infectives (From admission, onward)    None        DVT prophylaxis: SCDs  Code Status: Full code  Family Communication: No family at bedside   CONSULTS orthopedics   Subjective   Complains of pain   Objective    Physical Examination:   General-appears in no acute distress Heart-S1-S2, regular, no murmur auscultated Lungs-clear to auscultation bilaterally, no wheezing or crackles auscultated Abdomen-soft, nontender, no organomegaly Extremities-right lower extremity in Ace wrap Neuro-alert, oriented x3, no focal deficit noted   Status is: Inpatient:             [name] S [name]   [facility] If 7PM-7AM, please contact night-coverage at [email or web address], Office  [phone]   09/14/2023, 11:28 AM  LOS: 1 day

## 2023-09-14 NOTE — Consult Note (Signed)
ORTHOPAEDIC CONSULTATION  REQUESTING PHYSICIAN: Meredeth Ide, MD  Chief Complaint: right foot and ankle pain  HPI: Jordan [name] is a 57 y.o. female who complains of pain in the right foot and ankle after falling at home last night. She was unable to get up and bear weight on the right leg after the fall. She has a remote history of an injury to this same ankle in 2021 treated with an ex fix by Dr. Everardo Pacific and then an ORIF by Dr. Jena Gauss.   Imaging shows:    1. Acute nondisplaced fractures through the previously healed distal tibial diaphyseal fracture. 2. Acute minimally angulated and impacted oblique fracture of the mid fibular diaphysis just superior to the previously healed diaphyseal fracture. 3. Acute nondisplaced oblique fracture through the lateral malleolus. 4. Acute fracture at the base of the first metatarsal with impaction and medial subluxation of the dorsal first metatarsal base with respect to the medial cuneiform. Second through fifth TMT joint alignment is normal. 5. Acute impacted fracture of the second metatarsal neck.    Orthopedics was consulted for evaluation.   Last meal before midnight. No history of MI, CVA, DVT, PE.  Previously ambulatory without assistive device.  The patient is living at home alone.    Past Medical History:  Diagnosis Date   Alcohol abuse    Anemia    patient denies   Bipolar 1 disorder (HCC)    No medications currently   CAP (community acquired pneumonia) 03/17/2015   History of radiation therapy 08/24/19-09/22/19   Vaginal brachytherapy   Dr. 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   Mental disorder    Obesity    PICC line infection 05/17/2015   Sepsis due to Gram negative bacteria (MDR E Coli) 02/18/2015   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HCC)    UTI (lower urinary tract infection)    Vaginal Pap smear, abnormal    Past Surgical History:  Procedure Laterality Date   ABDOMINAL  HYSTERECTOMY     CERVICAL CONIZATION W/BX N/A 07/01/2016   Procedure: CONIZATION CERVIX WITH BIOPSY;  Surgeon: Hermina Staggers, MD;  Location: [facility];  Service: Gynecology;  Laterality: N/A;   CHOLECYSTECTOMY     EXTERNAL FIXATION LEG Right 04/09/2020   Procedure: EXTERNAL FIXATION LEG;  Surgeon: Bjorn Pippin, MD;  Location: [facility];  Service: Orthopedics;  Laterality: Right;   HYSTEROSCOPY WITH D & C N/A 01/25/2016   Procedure: DILATATION AND CURETTAGE /HYSTEROSCOPY;  Surgeon: Catalina Antigua, MD;  Location: [facility];  Service: Gynecology;  Laterality: N/A;   LYMPH NODE BIOPSY Bilateral 07/22/2019   Procedure: LYMPH NODE BIOPSY;  Surgeon: Adolphus Birchwood, MD;  Location: [facility];  Service: Gynecology;  Laterality: Bilateral;   OPEN REDUCTION INTERNAL FIXATION (ORIF) TIBIA/FIBULA FRACTURE Right 04/10/2020   Procedure: OPEN REDUCTION INTERNAL FIXATION (ORIF) TIBIA/FIBULA FRACTURE;  Surgeon: Roby Lofts, MD;  Location: [facility];  Service: Orthopedics;  Laterality: Right;   ROBOT ASSISTED MYOMECTOMY N/A 07/22/2019   Procedure: XI ROBOTIC ASSISTED LAPAROSCOPIC RADICAL UPPER VAGINECTOMY, LEFT SALPINECTOMY, RIGHT SALPINGOOOPHERECTOMY;  Surgeon: Adolphus Birchwood, MD;  Location: [facility];  Service: Gynecology;  Laterality: N/A;   VAGINAL HYSTERECTOMY N/A 03/11/2017   Procedure: HYSTERECTOMY VAGINAL WITH MORCELLATION;  Surgeon: Hermina Staggers, MD;  Location: [facility];  Service: Gynecology;  Laterality: N/A;   Social History   Socioeconomic History   Marital status: Single    Spouse name: Not on file  Number of children: Not on file   Years of education: Not on file   Highest education level: Not on file  Occupational History   Not on file  Tobacco Use   Smoking status: Never   Smokeless tobacco: Never  Vaping Use   Vaping status: Never Used  Substance and Sexual Activity   Alcohol use: Not Currently   Drug use: No   Sexual activity: Not Currently    Birth control/protection: Post-menopausal, Surgical     Comment: perimenopausal; no sex in years  Other Topics Concern   Not on file  Social History Narrative   Not on file   Social Determinants of Health   Financial Resource Strain: Not on file  Food Insecurity: No Food Insecurity (09/14/2023)   Hunger Vital Sign    Worried About Running Out of Food in the Last Year: Never true    Ran Out of Food in the Last Year: Never true  Recent Concern: Food Insecurity - Food Insecurity Present (07/16/2023)   Hunger Vital Sign    Worried About Running Out of Food in the Last Year: Sometimes true    Ran Out of Food in the Last Year: Sometimes true  Transportation Needs: No Transportation Needs (09/14/2023)   PRAPARE - Administrator, Civil Service (Medical): No    Lack of Transportation (Non-Medical): No  Recent Concern: Transportation Needs - Unmet Transportation Needs (07/16/2023)   PRAPARE - Administrator, Civil Service (Medical): Yes    Lack of Transportation (Non-Medical): Yes  Physical Activity: Not on file  Stress: Not on file  Social Connections: Not on file   Family History  Problem Relation Age of Onset   Alcohol abuse Father    Cancer Father    Breast cancer Neg Hx    Allergies  Allergen Reactions   Ceftriaxone Anaphylaxis    See progess note. Administration results in hives, throat pain, flushing and SOB.   Ciprofloxacin Swelling    Lips swell, tongue swells, face swells   Citalopram Other (See Comments)    Possible cause of pancytopenia. Swelling of tongue, face and throat   Lamotrigine Other (See Comments)    Possible cause of pancytopenia. Swelling of face, throat and tongue   Sulfa Antibiotics Anaphylaxis   Tramadol Anaphylaxis, Shortness Of Breath and Swelling   Prior to Admission medications   Medication Sig Start Date End Date Taking? Authorizing Provider  desvenlafaxine (PRISTIQ) 50 MG 24 hr tablet Take 50 mg by mouth every morning. 07/07/23  Yes [provider]  divalproex (DEPAKOTE  ER) 250 MG 24 hr tablet Take 250 mg by mouth at bedtime. 07/07/23  Yes [provider]  gabapentin (NEURONTIN) 800 MG tablet Take 800 mg by mouth 3 (three) times daily as needed (for pain). 01/11/22  Yes [provider]  naproxen (NAPROSYN) 500 MG tablet Take 500 mg by mouth 2 (two) times daily as needed for moderate pain (pain score 4-6).   Yes [provider]  oxyCODONE (ROXICODONE) 15 MG immediate release tablet Take 15 mg by mouth 4 (four) times daily as needed (for pain). 01/27/20  Yes [provider]   CT FOOT RIGHT WO CONTRAST  Result Date: 09/14/2023 CLINICAL DATA:  Right lower leg and foot injury. EXAM: CT OF THE RIGHT TIBIA FIBULA WITHOUT CONTRAST CT OF THE RIGHT FOOT WITHOUT CONTRAST TECHNIQUE: Multidetector CT imaging of the right tibia fibula and foot was performed according to the standard protocol. Multiplanar CT image reconstructions  were also generated. RADIATION DOSE REDUCTION: This exam was performed according to the departmental dose-optimization program which includes automated exposure control, adjustment of the mA and/or kV according to patient size and/or use of iterative reconstruction technique. COMPARISON:  Right lower leg and foot x-rays from yesterday. FINDINGS: Bones/Joint/Cartilage Chronic, partially united fracture of the distal tibial diaphysis status post ORIF. Hardware is intact. New acute nondisplaced fractures through the previously healed distal tibial diaphyseal fracture (series 7, image 45; series 8, image 32). Acute minimally angulated and impacted oblique fracture of the mid fibular diaphysis just superior to the previously healed fibular diaphyseal fracture (series 7, image 43). Acute nondisplaced oblique fracture through the lateral malleolus (series 8, image 57). Acute fracture at the base of the first metatarsal with impaction and medial subluxation of the dorsal first metatarsal base with respect to the medial cuneiform. Second  through fifth TMT joint alignment is normal. Acute impacted fracture of the second metatarsal neck. Osteopenia.  Joint spaces are preserved. No joint effusion. Ligaments Ligaments are suboptimally evaluated by CT. Muscles and Tendons Grossly intact. Soft tissue Dorsal foot soft tissue swelling. No fluid collection or hematoma. No soft tissue mass. IMPRESSION: 1. Acute nondisplaced fractures through the previously healed distal tibial diaphyseal fracture. 2. Acute minimally angulated and impacted oblique fracture of the mid fibular diaphysis just superior to the previously healed diaphyseal fracture. 3. Acute nondisplaced oblique fracture through the lateral malleolus. 4. Acute fracture at the base of the first metatarsal with impaction and medial subluxation of the dorsal first metatarsal base with respect to the medial cuneiform. Second through fifth TMT joint alignment is normal. 5. Acute impacted fracture of the second metatarsal neck. Electronically Signed   By: Obie Dredge M.D.   On: 09/14/2023 11:44   CT TIBIA FIBULA RIGHT WO CONTRAST  Result Date: 09/14/2023 CLINICAL DATA:  Right lower leg and foot injury. EXAM: CT OF THE RIGHT TIBIA FIBULA WITHOUT CONTRAST CT OF THE RIGHT FOOT WITHOUT CONTRAST TECHNIQUE: Multidetector CT imaging of the right tibia fibula and foot was performed according to the standard protocol. Multiplanar CT image reconstructions were also generated. RADIATION DOSE REDUCTION: This exam was performed according to the departmental dose-optimization program which includes automated exposure control, adjustment of the mA and/or kV according to patient size and/or use of iterative reconstruction technique. COMPARISON:  Right lower leg and foot x-rays from yesterday. FINDINGS: Bones/Joint/Cartilage Chronic, partially united fracture of the distal tibial diaphysis status post ORIF. Hardware is intact. New acute nondisplaced fractures through the previously healed distal tibial diaphyseal  fracture (series 7, image 45; series 8, image 32). Acute minimally angulated and impacted oblique fracture of the mid fibular diaphysis just superior to the previously healed fibular diaphyseal fracture (series 7, image 43). Acute nondisplaced oblique fracture through the lateral malleolus (series 8, image 57). Acute fracture at the base of the first metatarsal with impaction and medial subluxation of the dorsal first metatarsal base with respect to the medial cuneiform. Second through fifth TMT joint alignment is normal. Acute impacted fracture of the second metatarsal neck. Osteopenia.  Joint spaces are preserved. No joint effusion. Ligaments Ligaments are suboptimally evaluated by CT. Muscles and Tendons Grossly intact. Soft tissue Dorsal foot soft tissue swelling. No fluid collection or hematoma. No soft tissue mass. IMPRESSION: 1. Acute nondisplaced fractures through the previously healed distal tibial diaphyseal fracture. 2. Acute minimally angulated and impacted oblique fracture of the mid fibular diaphysis just superior to the previously healed diaphyseal fracture. 3. Acute nondisplaced oblique  fracture through the lateral malleolus. 4. Acute fracture at the base of the first metatarsal with impaction and medial subluxation of the dorsal first metatarsal base with respect to the medial cuneiform. Second through fifth TMT joint alignment is normal. 5. Acute impacted fracture of the second metatarsal neck. Electronically Signed   By: Obie Dredge M.D.   On: 09/14/2023 11:44   Chest Portable 1 View  Result Date: 09/13/2023 CLINICAL DATA:  Preop chest exam EXAM: PORTABLE CHEST 1 VIEW COMPARISON:  08/17/2022 FINDINGS: Low lung volumes accentuate bronchovascular markings. Right basilar scarring. No focal consolidation, pleural effusion, or pneumothorax. Stable cardiomediastinal silhouette. Aortic atherosclerotic calcification. IMPRESSION: Low lung volumes without acute cardiopulmonary disease.  Electronically Signed   By: Minerva Fester M.D.   On: 09/13/2023 23:33   DG Foot Complete Right  Result Date: 09/13/2023 CLINICAL DATA:  Right lower extremity pain after fall. Deformity to dorsum of right foot and toes are not aligned. EXAM: RIGHT FOOT COMPLETE - 3+ VIEW; RIGHT TIBIA AND FIBULA - 2 VIEW COMPARISON:  Radiographs 04/26/2020 and 06/19/2015 FINDINGS: Plate screw fixation of the remote fracture of the distal right tibia. Remote fracture deformities of the tibia and fibula. Acute nondisplaced oblique fracture of the mid right fibula. Question additional nondisplaced fracture of the mid to distal right tibia. This is only seen on lateral view and may be acute, chronic, or projection artifact. Acute displaced fracture of the base of the first metatarsal. The fracture line extends into the tarsometatarsal joint and Lisfranc joint. There is dorsal and medial displacement of the dominant distal fragment. Soft tissue swelling over the dorsum of the foot. IMPRESSION: 1. Acute displaced fracture of the base of the first metatarsal. The fracture line extends into the tarsometatarsal joint and Lisfranc joint. 2. Acute nondisplaced oblique fracture of the mid right fibula. 3. Question acute nondisplaced fracture of the mid to distal right tibia. This is only seen on lateral view. Differential considerations include chronic fracture or projection artifact. Consider CT for further evaluation. Electronically Signed   By: Minerva Fester M.D.   On: 09/13/2023 19:06   DG Tibia/Fibula Right  Result Date: 09/13/2023 CLINICAL DATA:  Right lower extremity pain after fall. Deformity to dorsum of right foot and toes are not aligned. EXAM: RIGHT FOOT COMPLETE - 3+ VIEW; RIGHT TIBIA AND FIBULA - 2 VIEW COMPARISON:  Radiographs 04/26/2020 and 06/19/2015 FINDINGS: Plate screw fixation of the remote fracture of the distal right tibia. Remote fracture deformities of the tibia and fibula. Acute nondisplaced oblique  fracture of the mid right fibula. Question additional nondisplaced fracture of the mid to distal right tibia. This is only seen on lateral view and may be acute, chronic, or projection artifact. Acute displaced fracture of the base of the first metatarsal. The fracture line extends into the tarsometatarsal joint and Lisfranc joint. There is dorsal and medial displacement of the dominant distal fragment. Soft tissue swelling over the dorsum of the foot. IMPRESSION: 1. Acute displaced fracture of the base of the first metatarsal. The fracture line extends into the tarsometatarsal joint and Lisfranc joint. 2. Acute nondisplaced oblique fracture of the mid right fibula. 3. Question acute nondisplaced fracture of the mid to distal right tibia. This is only seen on lateral view. Differential considerations include chronic fracture or projection artifact. Consider CT for further evaluation. Electronically Signed   By: Minerva Fester M.D.   On: 09/13/2023 19:06    Positive ROS: All other systems have been reviewed and were otherwise negative  with the exception of those mentioned in the HPI and as above.  Objective: Labs cbc Recent Labs    09/13/23 2128  WBC 11.3*  HGB 13.7  HCT 41.5  PLT 160    Labs inflam No results for input(s): "CRP" in the last 72 hours.  Invalid input(s): "ESR"  Labs coag No results for input(s): "INR", "PTT" in the last 72 hours.  Invalid input(s): "PT"  Recent Labs    09/13/23 2128  NA 138  K 3.6  CL 107  CO2 23  GLUCOSE 108*  BUN 8  CREATININE 0.71  CALCIUM 8.9    Physical Exam: Vitals:   09/14/23 0750 09/14/23 0937  BP: 125/68 103/65  Pulse:  79  Resp: 18 15  Temp:  98.2 F (36.8 C)  SpO2:  100%   General: Alert, no acute distress.  Sitting up in bed, calm, comfortable Mental status: Alert and Oriented x3 Neurologic: Speech Clear and organized, no gross focal findings or movement disorder appreciated. Respiratory: No cyanosis, no use of accessory  musculature Cardiovascular: No pedal edema GI: Abdomen is soft and non-tender, non-distended. Skin: Warm and dry.  Extremities: Warm and well perfused w/o edema Psychiatric: Patient is competent for consent with normal mood and affect  MUSCULOSKELETAL:  R ankle and foot TTP, edema and ecchymosis, ROM ankle and toes limited d/t pain, calf compressible, NVI Other extremities are atraumatic with painless ROM and NVI.  Assessment / Plan: Principal Problem:   Closed fracture of right tibia and fibula Active Problems:   Morbid obesity with BMI of 40.0-44.9, adult (HCC)   Chronic pain   Closed displaced fracture of first metatarsal bone of right foot    Have shared the CT images with the ortho trauma team (Dr. Starr Sinclair) for their recommendations. Some of the fractures look more chronic then acute. Will likely need definitive fixation in the next few days but will defer to them for decision. For now will place patient in CAM boot and make NPO at midnight.    Weightbearing: NWB RLE Orthopedic device(s): Splint VTE prophylaxis:  per primary   Pain control: chronic pain patient so likely will be difficult to manage   Jenne Pane PA-C Office [phone] 09/14/2023 1:01 PM

## 2023-09-15 ENCOUNTER — Inpatient Hospital Stay (HOSPITAL_COMMUNITY): Payer: MEDICAID

## 2023-09-15 ENCOUNTER — Encounter (HOSPITAL_COMMUNITY)
Admission: EM | Disposition: A | Discharge status: Skilled Nursing Facility | Payer: Self-pay | Source: Home / Self Care | Attending: Internal Medicine

## 2023-09-15 ENCOUNTER — Inpatient Hospital Stay (HOSPITAL_COMMUNITY): Payer: MEDICAID | Admitting: Anesthesiology

## 2023-09-15 ENCOUNTER — Ambulatory Visit: Payer: Self-pay | Admitting: Radiation Oncology

## 2023-09-15 ENCOUNTER — Encounter: Payer: Self-pay | Admitting: General Practice

## 2023-09-15 ENCOUNTER — Other Ambulatory Visit: Payer: Self-pay

## 2023-09-15 ENCOUNTER — Encounter (HOSPITAL_COMMUNITY): Payer: Self-pay | Admitting: Internal Medicine

## 2023-09-15 DIAGNOSIS — F413 Other mixed anxiety disorders: Secondary | ICD-10-CM | POA: Diagnosis not present

## 2023-09-15 DIAGNOSIS — S93324A Dislocation of tarsometatarsal joint of right foot, initial encounter: Secondary | ICD-10-CM

## 2023-09-15 DIAGNOSIS — S82401A Unspecified fracture of shaft of right fibula, initial encounter for closed fracture: Secondary | ICD-10-CM | POA: Diagnosis not present

## 2023-09-15 DIAGNOSIS — S92311A Displaced fracture of first metatarsal bone, right foot, initial encounter for closed fracture: Secondary | ICD-10-CM | POA: Diagnosis not present

## 2023-09-15 DIAGNOSIS — S82201A Unspecified fracture of shaft of right tibia, initial encounter for closed fracture: Secondary | ICD-10-CM | POA: Diagnosis not present

## 2023-09-15 HISTORY — PX: OPEN REDUCTION INTERNAL FIXATION (ORIF) FOOT LISFRANC FRACTURE: SHX5990

## 2023-09-15 LAB — CBC
HCT: 36.8 % (ref 36.0–46.0)
Hemoglobin: 13 g/dL (ref 12.0–15.0)
MCH: 31.6 pg (ref 26.0–34.0)
MCHC: 35.3 g/dL (ref 30.0–36.0)
MCV: 89.3 fL (ref 80.0–100.0)
Platelets: 120 10*3/uL — ABNORMAL LOW (ref 150–400)
RBC: 4.12 MIL/uL (ref 3.87–5.11)
RDW: 12.5 % (ref 11.5–15.5)
WBC: 6.4 10*3/uL (ref 4.0–10.5)
nRBC: 0 % (ref 0.0–0.2)

## 2023-09-15 LAB — CREATININE, SERUM
Creatinine, Ser: 0.67 mg/dL (ref 0.44–1.00)
GFR, Estimated: >60 mL/min (ref >60)

## 2023-09-15 SURGERY — OPEN REDUCTION INTERNAL FIXATION (ORIF) FOOT LISFRANC FRACTURE
Anesthesia: General | Site: Foot | Laterality: Right

## 2023-09-15 MED ORDER — MIDAZOLAM HCL 2 MG/2ML IJ SOLN
INTRAMUSCULAR | Status: DC | PRN
  Administered 2023-09-15: 2 mg via INTRAVENOUS

## 2023-09-15 MED ORDER — HYDROMORPHONE HCL 1 MG/ML IJ SOLN: 0.2500 mg | INTRAMUSCULAR | Status: DC | PRN

## 2023-09-15 MED ORDER — HYDROMORPHONE HCL 1 MG/ML IJ SOLN
INTRAMUSCULAR | Status: AC
  Filled 2023-09-15: qty 0.5

## 2023-09-15 MED ORDER — KETOROLAC TROMETHAMINE 30 MG/ML IJ SOLN: 30.0000 mg | Freq: Once | INTRAMUSCULAR | Status: DC | PRN

## 2023-09-15 MED ORDER — FENTANYL CITRATE (PF) 100 MCG/2ML IJ SOLN
INTRAMUSCULAR | Status: AC
  Administered 2023-09-15: 100 ug via INTRAVENOUS
  Filled 2023-09-15: qty 2

## 2023-09-15 MED ORDER — PROPOFOL 10 MG/ML IV BOLUS
INTRAVENOUS | Status: DC | PRN
  Administered 2023-09-15: 150 mg via INTRAVENOUS

## 2023-09-15 MED ORDER — MEPERIDINE HCL 25 MG/ML IJ SOLN: 6.2500 mg | INTRAMUSCULAR | Status: DC | PRN

## 2023-09-15 MED ORDER — CLONIDINE HCL (ANALGESIA) 100 MCG/ML EP SOLN
EPIDURAL | Status: DC | PRN
  Administered 2023-09-15 (×2): 100 ug

## 2023-09-15 MED ORDER — CEFAZOLIN SODIUM-DEXTROSE 2-3 GM-%(50ML) IV SOLR
INTRAVENOUS | Status: DC | PRN
Start: 2023-09-15 — End: 2023-09-15
  Administered 2023-09-15: 3 g via INTRAVENOUS

## 2023-09-15 MED ORDER — KETAMINE HCL 50 MG/5ML IJ SOSY
PREFILLED_SYRINGE | INTRAMUSCULAR | Status: AC
  Filled 2023-09-15: qty 5

## 2023-09-15 MED ORDER — VANCOMYCIN HCL 1000 MG IV SOLR
INTRAVENOUS | Status: AC
Start: 2023-09-15 — End: ?
  Filled 2023-09-15: qty 20

## 2023-09-15 MED ORDER — CEFAZOLIN SODIUM 1 G IJ SOLR
INTRAMUSCULAR | Status: AC
  Filled 2023-09-15: qty 10

## 2023-09-15 MED ORDER — ENOXAPARIN SODIUM 40 MG/0.4ML IJ SOSY
40.0000 mg | PREFILLED_SYRINGE | INTRAMUSCULAR | Status: DC
  Administered 2023-09-16 – 2023-09-29 (×14): 40 mg via SUBCUTANEOUS
  Filled 2023-09-15 (×14): qty 0.4

## 2023-09-15 MED ORDER — LIDOCAINE 2% (20 MG/ML) 5 ML SYRINGE
INTRAMUSCULAR | Status: DC | PRN
  Administered 2023-09-15: 100 mg via INTRAVENOUS

## 2023-09-15 MED ORDER — CEFAZOLIN SODIUM 1 G IJ SOLR
INTRAMUSCULAR | Status: AC
  Filled 2023-09-15: qty 20

## 2023-09-15 MED ORDER — ONDANSETRON HCL 4 MG PO TABS: 4.0000 mg | ORAL_TABLET | Freq: Four times a day (QID) | ORAL | Status: DC | PRN

## 2023-09-15 MED ORDER — SODIUM CHLORIDE 0.9 % IV SOLN
12.5000 mg | INTRAVENOUS | Status: DC | PRN
  Filled 2023-09-15: qty 0.5

## 2023-09-15 MED ORDER — KETOROLAC TROMETHAMINE 15 MG/ML IJ SOLN
15.0000 mg | Freq: Four times a day (QID) | INTRAMUSCULAR | Status: DC
  Administered 2023-09-15 – 2023-09-16 (×3): 15 mg via INTRAVENOUS
  Filled 2023-09-15 (×3): qty 1

## 2023-09-15 MED ORDER — VANCOMYCIN HCL 1000 MG IV SOLR
INTRAVENOUS | Status: DC | PRN
  Administered 2023-09-15: 1000 mg via TOPICAL

## 2023-09-15 MED ORDER — METOCLOPRAMIDE HCL 5 MG PO TABS
5.0000 mg | ORAL_TABLET | Freq: Three times a day (TID) | ORAL | Status: DC | PRN
Start: 2023-09-15 — End: 2023-09-30

## 2023-09-15 MED ORDER — PROPOFOL 10 MG/ML IV BOLUS
INTRAVENOUS | Status: AC
  Filled 2023-09-15: qty 20

## 2023-09-15 MED ORDER — MIDAZOLAM HCL 2 MG/2ML IJ SOLN: 2.0000 mg | Freq: Once | INTRAMUSCULAR | Status: AC

## 2023-09-15 MED ORDER — GABAPENTIN 400 MG PO CAPS
800.0000 mg | ORAL_CAPSULE | Freq: Three times a day (TID) | ORAL | Status: DC | PRN
  Administered 2023-09-28 (×2): 800 mg via ORAL
  Filled 2023-09-15 (×4): qty 2

## 2023-09-15 MED ORDER — MIDAZOLAM HCL 2 MG/2ML IJ SOLN
INTRAMUSCULAR | Status: AC
Start: 2023-09-15 — End: ?
  Filled 2023-09-15: qty 2

## 2023-09-15 MED ORDER — CEFAZOLIN SODIUM-DEXTROSE 2-4 GM/100ML-% IV SOLN
2.0000 g | Freq: Three times a day (TID) | INTRAVENOUS | Status: AC
  Administered 2023-09-15 – 2023-09-16 (×3): 2 g via INTRAVENOUS
  Filled 2023-09-15 (×3): qty 100

## 2023-09-15 MED ORDER — METOCLOPRAMIDE HCL 5 MG/ML IJ SOLN: 5.0000 mg | Freq: Three times a day (TID) | INTRAMUSCULAR | Status: DC | PRN

## 2023-09-15 MED ORDER — LIDOCAINE 2% (20 MG/ML) 5 ML SYRINGE
INTRAMUSCULAR | Status: AC
  Filled 2023-09-15: qty 5

## 2023-09-15 MED ORDER — METHOCARBAMOL 500 MG PO TABS
500.0000 mg | ORAL_TABLET | Freq: Four times a day (QID) | ORAL | Status: DC | PRN
  Administered 2023-09-16 – 2023-09-27 (×28): 500 mg via ORAL
  Filled 2023-09-15 (×29): qty 1

## 2023-09-15 MED ORDER — LACTATED RINGERS IV SOLN: INTRAVENOUS | Status: DC

## 2023-09-15 MED ORDER — MIDAZOLAM HCL 2 MG/2ML IJ SOLN
INTRAMUSCULAR | Status: AC
  Administered 2023-09-15: 2 mg via INTRAVENOUS
  Filled 2023-09-15: qty 2

## 2023-09-15 MED ORDER — PHENYLEPHRINE 80 MCG/ML (10ML) SYRINGE FOR IV PUSH (FOR BLOOD PRESSURE SUPPORT)
PREFILLED_SYRINGE | INTRAVENOUS | Status: DC | PRN
  Administered 2023-09-15 (×2): 80 ug via INTRAVENOUS

## 2023-09-15 MED ORDER — ONDANSETRON HCL 4 MG/2ML IJ SOLN: 4.0000 mg | Freq: Four times a day (QID) | INTRAMUSCULAR | Status: DC | PRN

## 2023-09-15 MED ORDER — 0.9 % SODIUM CHLORIDE (POUR BTL) OPTIME
TOPICAL | Status: DC | PRN
  Administered 2023-09-15: 1000 mL

## 2023-09-15 MED ORDER — CHLORHEXIDINE GLUCONATE 0.12 % MT SOLN: 15.0000 mL | Freq: Once | OROMUCOSAL | Status: AC

## 2023-09-15 MED ORDER — ORAL CARE MOUTH RINSE
15.0000 mL | Freq: Once | OROMUCOSAL | Status: AC
  Administered 2023-09-15: 15 mL via OROMUCOSAL

## 2023-09-15 MED ORDER — FENTANYL CITRATE (PF) 100 MCG/2ML IJ SOLN: 100.0000 ug | Freq: Once | INTRAMUSCULAR | Status: AC

## 2023-09-15 MED ORDER — POLYETHYLENE GLYCOL 3350 17 G PO PACK: 17.0000 g | PACK | Freq: Every day | ORAL | Status: DC | PRN

## 2023-09-15 MED ORDER — ONDANSETRON HCL 4 MG/2ML IJ SOLN
INTRAMUSCULAR | Status: DC | PRN
  Administered 2023-09-15: 4 mg via INTRAVENOUS

## 2023-09-15 MED ORDER — DOCUSATE SODIUM 100 MG PO CAPS
100.0000 mg | ORAL_CAPSULE | Freq: Two times a day (BID) | ORAL | Status: DC
  Administered 2023-09-15 – 2023-09-25 (×6): 100 mg via ORAL
  Filled 2023-09-15 (×18): qty 1

## 2023-09-15 MED ORDER — DEXAMETHASONE SODIUM PHOSPHATE 10 MG/ML IJ SOLN
INTRAMUSCULAR | Status: DC | PRN
  Administered 2023-09-15 (×3): 5 mg via INTRAVENOUS

## 2023-09-15 MED ORDER — ROCURONIUM BROMIDE 10 MG/ML (PF) SYRINGE
PREFILLED_SYRINGE | INTRAVENOUS | Status: AC
  Filled 2023-09-15: qty 10

## 2023-09-15 MED ORDER — METHOCARBAMOL 1000 MG/10ML IJ SOLN: 500.0000 mg | Freq: Four times a day (QID) | INTRAMUSCULAR | Status: DC | PRN

## 2023-09-15 MED ORDER — ROPIVACAINE HCL 5 MG/ML IJ SOLN
INTRAMUSCULAR | Status: DC | PRN
  Administered 2023-09-15 (×8): 5 mL via PERINEURAL

## 2023-09-15 SURGICAL SUPPLY — 59 items
BAG COUNTER SPONGE SURGICOUNT (BAG) ×1 IMPLANT
BANDAGE ESMARK 6X9 LF (GAUZE/BANDAGES/DRESSINGS) ×1 IMPLANT
BIT DRILL CAL 2.5 ST W/SLV (BIT) IMPLANT
BNDG COHESIVE 4X5 TAN STRL (GAUZE/BANDAGES/DRESSINGS) ×1 IMPLANT
BNDG ELASTIC 4INX 5YD STR LF (GAUZE/BANDAGES/DRESSINGS) IMPLANT
BNDG ELASTIC 4X5.8 VLCR STR LF (GAUZE/BANDAGES/DRESSINGS) IMPLANT
BNDG ELASTIC 6INX 5YD STR LF (GAUZE/BANDAGES/DRESSINGS) IMPLANT
BNDG ELASTIC 6X5.8 VLCR STR LF (GAUZE/BANDAGES/DRESSINGS) IMPLANT
BNDG ESMARK 6X9 LF (GAUZE/BANDAGES/DRESSINGS) IMPLANT
BRUSH SCRUB EZ PLAIN DRY (MISCELLANEOUS) ×2 IMPLANT
CHLORAPREP W/TINT 26 (MISCELLANEOUS) ×1 IMPLANT
COVER SURGICAL LIGHT HANDLE (MISCELLANEOUS) ×1 IMPLANT
DRAPE C-ARM 42X72 X-RAY (DRAPES) ×1 IMPLANT
DRAPE C-ARMOR (DRAPES) ×1 IMPLANT
DRAPE EXTREMITY T 121X128X90 (DISPOSABLE) IMPLANT
DRAPE SURG ORHT 6 SPLT 77X108 (DRAPES) ×2 IMPLANT
DRAPE U-SHAPE 47X51 STRL (DRAPES) ×1 IMPLANT
DRIVER STRM ST T10 (ORTHOPEDIC DISPOSABLE SUPPLIES) IMPLANT
DRSG ADAPTIC 3X8 NADH LF (GAUZE/BANDAGES/DRESSINGS) IMPLANT
DRSG MEPITEL 4X7.2 (GAUZE/BANDAGES/DRESSINGS) IMPLANT
ELECT REM PT RETURN 9FT ADLT (ELECTROSURGICAL) ×1 IMPLANT
ELECTRODE REM PT RTRN 9FT ADLT (ELECTROSURGICAL) ×1 IMPLANT
GAUZE SPONGE 4X4 12PLY STRL (GAUZE/BANDAGES/DRESSINGS) IMPLANT
GLOVE BIO SURGEON STRL SZ 6.5 (GLOVE) ×3 IMPLANT
GLOVE BIO SURGEON STRL SZ7.5 (GLOVE) ×3 IMPLANT
GLOVE BIOGEL PI IND STRL 6.5 (GLOVE) ×1 IMPLANT
GLOVE BIOGEL PI IND STRL 7.5 (GLOVE) ×1 IMPLANT
GOWN STRL REUS W/ TWL LRG LVL3 (GOWN DISPOSABLE) ×2 IMPLANT
KIT STRATUM INSTRUMENT STD (KITS) IMPLANT
KIT TURNOVER KIT B (KITS) ×1 IMPLANT
MANIFOLD NEPTUNE II (INSTRUMENTS) ×1 IMPLANT
NDL HYPO 21X1.5 SAFETY (NEEDLE) IMPLANT
NDL HYPO 25GX1X1/2 BEV (NEEDLE) ×1 IMPLANT
NEEDLE HYPO 21X1.5 SAFETY (NEEDLE) IMPLANT
NEEDLE HYPO 25GX1X1/2 BEV (NEEDLE) ×1 IMPLANT
NS IRRIG 1000ML POUR BTL (IV SOLUTION) ×1 IMPLANT
PACK TOTAL JOINT (CUSTOM PROCEDURE TRAY) ×1 IMPLANT
PAD ARMBOARD 7.5X6 YLW CONV (MISCELLANEOUS) ×2 IMPLANT
PAD CAST 4YDX4 CTTN HI CHSV (CAST SUPPLIES) IMPLANT
PADDING CAST COTTON 6X4 STRL (CAST SUPPLIES) IMPLANT
PLATE SNGL LISFRANC HOLE 3 (Plate) IMPLANT
SCREW LP STRM NL 3.5X26 ST (Screw) IMPLANT
SCREW NL LP STRATUM 3.5X16 (Screw) IMPLANT
SCREW NONLOCK ST 3.5X28 (Screw) IMPLANT
SCREW STRATUM NL LP ST 3.5X22 (Screw) IMPLANT
SPLINT PLASTER CAST FAST 5X30 (CAST SUPPLIES) IMPLANT
SPONGE T-LAP 18X18 ~~LOC~~+RFID (SPONGE) IMPLANT
STAPLER VISISTAT 35W (STAPLE) ×1 IMPLANT
SUCTION TUBE FRAZIER 10FR DISP (SUCTIONS) ×1 IMPLANT
SUT ETHILON 3 0 PS 1 (SUTURE) ×2 IMPLANT
SUT PROLENE 0 CT (SUTURE) IMPLANT
SUT VIC AB 0 CT1 27XBRD ANBCTR (SUTURE) ×1 IMPLANT
SUT VIC AB 2-0 CT1 TAPERPNT 27 (SUTURE) ×2 IMPLANT
SUT VIC AB CT1 27XBRD ANBCTRL (SUTURE) ×1
SYR CONTROL 10ML LL (SYRINGE) ×1 IMPLANT
TOWEL GREEN STERILE (TOWEL DISPOSABLE) ×2 IMPLANT
TOWEL GREEN STERILE FF (TOWEL DISPOSABLE) ×1 IMPLANT
UNDERPAD 30X36 HEAVY ABSORB (UNDERPADS AND DIAPERS) ×1 IMPLANT
WATER STERILE IRR 1000ML POUR (IV SOLUTION) ×1 IMPLANT

## 2023-09-15 NOTE — Anesthesia Procedure Notes (Signed)
Procedure Name: LMA Insertion Date/Time: 09/15/2023 11:11 AM  Performed by: Caryn Bee A, CRNAPre-anesthesia Checklist: Patient identified, Emergency Drugs available, Suction available and Patient being monitored Patient Re-evaluated:Patient Re-evaluated prior to induction Oxygen Delivery Method: Circle System Utilized Preoxygenation: Pre-oxygenation with 100% oxygen Induction Type: IV induction Ventilation: Mask ventilation without difficulty LMA: LMA inserted LMA Size: 4.0 Number of attempts: 1 Airway Equipment and Method: Bite block Placement Confirmation: positive ETCO2 Tube secured with: Tape Dental Injury: Teeth and Oropharynx as per pre-operative assessment

## 2023-09-15 NOTE — Progress Notes (Signed)
[facility]  PROGRESS NOTE  Jordan [name] UUV:[id number] DOB: 1966-02-14 DOA: 09/13/2023 PCP: Courtney Paris, NP   Brief HPI:   57 y.o. female with medical history significant of BPD1, CPS, morbid obesity.   Pt in to ED after mechanical fall.  Injured R lower leg and foot in fall.  Inability to bear wt or ambulate following fall.  Pain severe.    Assessment/Plan:   Closed fracture of right tibia and fibula Displaced R first metatarsal fx. Non-displaced preiprosthetic R tibia fx, right fibula fracture -Orthopedics consulted -Underwent ORIF right first tarsometatarsal fracture/dislocation -Closed treatment of right tibia fracture -Continue pain management with oxycodone 15 mg every 4 hours as needed -Continue Dilaudid 0.5 mg IV every 2 hours as needed  Gupta score = 0.5% (pt technically ASA 3 though only due to BMI > 40)   Chronic pain -Continue oxycodone as above   Morbid obesity with BMI of 40.0-44.9, adult (HCC) Consider starting pt on Zepbound or Wegovy as outpt if these can be made financially viable.      Medications     divalproex  250 mg Oral QHS   docusate sodium  100 mg Oral BID   [START ON 09/16/2023] enoxaparin (LOVENOX) injection  40 mg Subcutaneous Q24H   feeding supplement  237 mL Oral BID BM   ketorolac  15 mg Intravenous Q6H   multivitamin with minerals  1 tablet Oral Daily   venlafaxine XR  75 mg Oral Q breakfast     Data Reviewed:   CBG:  No results for input(s): "GLUCAP" in the last 168 hours.  SpO2: 95 % O2 Flow Rate (L/min): 2 L/min    Vitals:   09/15/23 1315 09/15/23 1330 09/15/23 1345 09/15/23 1401  BP: 123/73 113/71 97/72 106/76  Pulse: 82 75 75 65  Resp: 18 18 (!) 21 16  Temp:   97.6 F (36.4 C) 98 F (36.7 C)  TempSrc:      SpO2: 99% 93% 94% 95%  Weight:      Height:          Data Reviewed:  Basic Metabolic Panel: Recent Labs  Lab 09/13/23 2128  NA 138  K 3.6  CL 107  CO2 23  GLUCOSE 108*  BUN 8   CREATININE 0.71  CALCIUM 8.9    CBC: Recent Labs  Lab 09/13/23 2128  WBC 11.3*  NEUTROABS 9.6*  HGB 13.7  HCT 41.5  MCV 92.4  PLT 160    LFT No results for input(s): "AST", "ALT", "ALKPHOS", "BILITOT", "PROT", "ALBUMIN" in the last 168 hours.   Antibiotics: Anti-infectives (From admission, onward)    Start     Dose/Rate Route Frequency Ordered Stop   09/15/23 1800  ceFAZolin (ANCEF) IVPB 2g/100 mL premix        2 g 200 mL/hr over 30 Minutes Intravenous Every 8 hours 09/15/23 1403 09/16/23 2159   09/15/23 1210  vancomycin (VANCOCIN) powder  Status:  Discontinued          As needed 09/15/23 1217 09/15/23 1223        DVT prophylaxis: SCDs  Code Status: Full code  Family Communication: No family at bedside   CONSULTS orthopedics   Subjective   Seen after surgery, denies pain   Objective    Physical Examination:   General-appears in no acute distress Heart-S1-S2, regular, no murmur auscultated Lungs-clear to auscultation bilaterally, no wheezing or crackles auscultated Abdomen-soft, nontender, no organomegaly Extremities-right lower extremity in dressing Neuro-alert, oriented x3, no focal  deficit noted   Status is: Inpatient:          Meredeth Ide   [facility] If 7PM-7AM, please contact night-coverage at [email or web address], Office  [phone]   09/15/2023, 2:19 PM  LOS: 2 days

## 2023-09-15 NOTE — Anesthesia Preprocedure Evaluation (Addendum)
Anesthesia Evaluation  Patient identified by MRN, date of birth, ID band Patient awake    Reviewed: Allergy & Precautions, H&P , NPO status , Patient's Chart, lab work & pertinent test results  Airway Mallampati: II       Dental  (+) Poor Dentition, Edentulous Upper, Missing,    Pulmonary    breath sounds clear to auscultation       Cardiovascular negative cardio ROS Normal cardiovascular exam     Neuro/Psych  PSYCHIATRIC DISORDERS Anxiety Depression Bipolar Disorder   Bipolar   GI/Hepatic negative GI ROS,,,(+)     substance abuse  alcohol use  Endo/Other    Class 4 obesity  Renal/GU negative Renal ROS  negative genitourinary   Musculoskeletal Open Fx right tibia/fibula   Abdominal  (+) + obese  Peds  Hematology   Anesthesia Other Findings   Reproductive/Obstetrics                             Anesthesia Physical Anesthesia Plan  ASA: III  Anesthesia Plan: General   Post-op Pain Management: Regional block*   Induction: Intravenous  PONV Risk Score and Plan: 3 and Ondansetron, Dexamethasone, Midazolam and Treatment may vary due to age or medical condition  Airway Management Planned: LMA  Additional Equipment: None  Intra-op Plan:   Post-operative Plan: Extubation in OR  Informed Consent: I have reviewed the patients History and Physical, chart, labs and discussed the procedure including the risks, benefits and alternatives for the proposed anesthesia with the patient or authorized representative who has indicated his/her understanding and acceptance.     Dental advisory given  Plan Discussed with: CRNA  Anesthesia Plan Comments:         Anesthesia Quick Evaluation

## 2023-09-15 NOTE — Anesthesia Procedure Notes (Signed)
Anesthesia Regional Block: Popliteal block   Pre-Anesthetic Checklist: , timeout performed,  Correct Patient, Correct Site, Correct Laterality,  Correct Procedure, Correct Position, site marked,  Risks and benefits discussed,  Surgical consent,  Pre-op evaluation,  At surgeon's request and post-op pain management  Laterality: Lower and Right  Prep: chloraprep       Needles:  Injection technique: Single-shot  Needle Type: Echogenic Needle     Needle Length: 9cm  Needle Gauge: 20   Needle insertion depth: 3 cm   Additional Needles:   Procedures:,,,, ultrasound used (permanent image in chart),,    Narrative:  Start time: 09/15/2023 9:50 AM End time: 09/15/2023 9:58 AM Injection made incrementally with aspirations every 5 mL.  Performed by: Personally  Anesthesiologist: Leilani Able, MD

## 2023-09-15 NOTE — Anesthesia Postprocedure Evaluation (Signed)
Anesthesia Post Note  Patient: Jordan [name]  Procedure(s) Performed: OPEN REDUCTION INTERNAL FIXATION (ORIF) FOOT LISFRANC FRACTURE (Right: Foot)     Patient location during evaluation: PACU Anesthesia Type: General Level of consciousness: sedated Pain management: pain level controlled Vital Signs Assessment: post-procedure vital signs reviewed and stable Respiratory status: spontaneous breathing Cardiovascular status: stable Postop Assessment: no apparent nausea or vomiting Anesthetic complications: no  No notable events documented.  Last Vitals:  Vitals:   09/15/23 1230 09/15/23 1245  BP: 104/61 106/60  Pulse: 62 62  Resp: 16 16  Temp: 36.4 C   SpO2: 98% 98%    Last Pain:  Vitals:   09/15/23 1245  TempSrc:   PainSc: Asleep                 Caren Macadam

## 2023-09-15 NOTE — Consult Note (Signed)
Reason for Consult:Right Lisfranc injury Referring Physician: Renaye Rakers Time called: [id number] Time at bedside: 0906   Jordan [name] is an 57 y.o. female.  HPI: Jordan [name] was hurrying to get her mail yesterday when she misstepped and fell. She had immediate right leg/foot pain and could not get up. She was brought to the ED where x-rays showed a right Lisfranc injury and tib/fib fxs and orthopedic surgery was consulted. Due to the complexity of the fractures orthopedic trauma consultation was requested. She lives at home and ambulates with the aid of a rollator.  Past Medical History:  Diagnosis Date   Alcohol abuse    Anemia    patient denies   Bipolar 1 disorder (HCC)    No medications currently   CAP (community acquired pneumonia) 03/17/2015   History of radiation therapy 08/24/19-09/22/19   Vaginal brachytherapy   Dr. 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   Mental disorder    Obesity    PICC line infection 05/17/2015   Sepsis due to Gram negative bacteria (MDR E Coli) 02/18/2015   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HCC)    UTI (lower urinary tract infection)    Vaginal Pap smear, abnormal     Past Surgical History:  Procedure Laterality Date   ABDOMINAL HYSTERECTOMY     CERVICAL CONIZATION W/BX N/A 07/01/2016   Procedure: CONIZATION CERVIX WITH BIOPSY;  Surgeon: Hermina Staggers, MD;  Location: [facility];  Service: Gynecology;  Laterality: N/A;   CHOLECYSTECTOMY     EXTERNAL FIXATION LEG Right 04/09/2020   Procedure: EXTERNAL FIXATION LEG;  Surgeon: Bjorn Pippin, MD;  Location: [facility];  Service: Orthopedics;  Laterality: Right;   HYSTEROSCOPY WITH D & C N/A 01/25/2016   Procedure: DILATATION AND CURETTAGE /HYSTEROSCOPY;  Surgeon: Catalina Antigua, MD;  Location: [facility];  Service: Gynecology;  Laterality: N/A;   LYMPH NODE BIOPSY Bilateral 07/22/2019   Procedure: LYMPH NODE BIOPSY;  Surgeon: Adolphus Birchwood, MD;  Location: [facility];  Service: Gynecology;   Laterality: Bilateral;   OPEN REDUCTION INTERNAL FIXATION (ORIF) TIBIA/FIBULA FRACTURE Right 04/10/2020   Procedure: OPEN REDUCTION INTERNAL FIXATION (ORIF) TIBIA/FIBULA FRACTURE;  Surgeon: Roby Lofts, MD;  Location: [facility];  Service: Orthopedics;  Laterality: Right;   ROBOT ASSISTED MYOMECTOMY N/A 07/22/2019   Procedure: XI ROBOTIC ASSISTED LAPAROSCOPIC RADICAL UPPER VAGINECTOMY, LEFT SALPINECTOMY, RIGHT SALPINGOOOPHERECTOMY;  Surgeon: Adolphus Birchwood, MD;  Location: [facility];  Service: Gynecology;  Laterality: N/A;   VAGINAL HYSTERECTOMY N/A 03/11/2017   Procedure: HYSTERECTOMY VAGINAL WITH MORCELLATION;  Surgeon: Hermina Staggers, MD;  Location: [facility];  Service: Gynecology;  Laterality: N/A;    Family History  Problem Relation Age of Onset   Alcohol abuse Father    Cancer Father    Breast cancer Neg Hx     Social History:  reports that she has never smoked. She has never used smokeless tobacco. She reports that she does not currently use alcohol. She reports that she does not use drugs.  Allergies:  Allergies  Allergen Reactions   Ceftriaxone Anaphylaxis    See progess note. Administration results in hives, throat pain, flushing and SOB.   Ciprofloxacin Swelling    Lips swell, tongue swells, face swells   Citalopram Other (See Comments)    Possible cause of pancytopenia. Swelling of tongue, face and throat   Lamotrigine Other (See Comments)    Possible cause of pancytopenia. Swelling of face, throat and tongue   Sulfa Antibiotics Anaphylaxis  Tramadol Anaphylaxis, Shortness Of Breath and Swelling    Medications: I have reviewed the patient's current medications.  Results for orders placed or performed during the hospital encounter of 09/13/23 (from the past 48 hour(s))  CBC with Differential     Status: Abnormal   Collection Time: 09/13/23  9:28 PM  Result Value Ref Range   WBC 11.3 (H) 4.0 - 10.5 K/uL   RBC 4.49 3.87 - 5.11 MIL/uL   Hemoglobin 13.7 12.0 - 15.0 g/dL   HCT  16.1 09.6 - 04.5 %   MCV 92.4 80.0 - 100.0 fL   MCH 30.5 26.0 - 34.0 pg   MCHC 33.0 30.0 - 36.0 g/dL   RDW 40.9 81.1 - 91.4 %   Platelets 160 150 - 400 K/uL   nRBC 0.0 0.0 - 0.2 %   Neutrophils Relative % 85 %   Neutro Abs 9.6 (H) 1.7 - 7.7 K/uL   Lymphocytes Relative 8 %   Lymphs Abs 0.9 0.7 - 4.0 K/uL   Monocytes Relative 7 %   Monocytes Absolute 0.8 0.1 - 1.0 K/uL   Eosinophils Relative 0 %   Eosinophils Absolute 0.0 0.0 - 0.5 K/uL   Basophils Relative 0 %   Basophils Absolute 0.0 0.0 - 0.1 K/uL   Immature Granulocytes 0 %   Abs Immature Granulocytes 0.05 0.00 - 0.07 K/uL    Comment: Performed at [facility], [address] [place], [place] [address]  Basic metabolic panel     Status: Abnormal   Collection Time: 09/13/23  9:28 PM  Result Value Ref Range   Sodium 138 135 - 145 mmol/L   Potassium 3.6 3.5 - 5.1 mmol/L   Chloride 107 98 - 111 mmol/L   CO2 23 22 - 32 mmol/L   Glucose, Bld 108 (H) 70 - 99 mg/dL    Comment: Glucose reference range applies only to samples taken after fasting for at least 8 hours.   BUN 8 6 - 20 mg/dL   Creatinine, Ser 6.21 0.44 - 1.00 mg/dL   Calcium 8.9 8.9 - 30.8 mg/dL   GFR, Estimated >65 >78 mL/min    Comment: (NOTE) Calculated using the CKD-EPI Creatinine Equation (2021)    Anion gap 8 5 - 15    Comment: Performed at [facility], [address], [place], [place] [address]  Surgical pcr screen     Status: None   Collection Time: 09/14/23  3:39 AM   Specimen: Nasal Mucosa; Nasal Swab  Result Value Ref Range   MRSA, PCR NEGATIVE NEGATIVE   Staphylococcus aureus NEGATIVE NEGATIVE    Comment: (NOTE) The Xpert SA Assay (FDA approved for NASAL specimens in patients 79 years of age and older), is one component of a comprehensive surveillance program. It is not intended to diagnose infection nor to guide or monitor treatment. Performed at [facility], [address] [place], [place] [address]   HIV Antibody (routine  testing w rflx)     Status: None   Collection Time: 09/14/23  6:16 AM  Result Value Ref Range   HIV Screen 4th Generation wRfx Non Reactive Non Reactive    Comment: Performed at [facility], [address], [place], [place] [address]   *Note: Due to a large number of results and/or encounters for the requested time period, some results have not been displayed. A complete set of results can be found in Results Review.    CT FOOT RIGHT WO  CONTRAST  Result Date: 09/14/2023 CLINICAL DATA:  Right lower leg and foot injury. EXAM: CT OF THE RIGHT TIBIA FIBULA WITHOUT CONTRAST CT OF THE RIGHT FOOT WITHOUT CONTRAST TECHNIQUE: Multidetector CT imaging of the right tibia fibula and foot was performed according to the standard protocol. Multiplanar CT image reconstructions were also generated. RADIATION DOSE REDUCTION: This exam was performed according to the departmental dose-optimization program which includes automated exposure control, adjustment of the mA and/or kV according to patient size and/or use of iterative reconstruction technique. COMPARISON:  Right lower leg and foot x-rays from yesterday. FINDINGS: Bones/Joint/Cartilage Chronic, partially united fracture of the distal tibial diaphysis status post ORIF. Hardware is intact. New acute nondisplaced fractures through the previously healed distal tibial diaphyseal fracture (series 7, image 45; series 8, image 32). Acute minimally angulated and impacted oblique fracture of the mid fibular diaphysis just superior to the previously healed fibular diaphyseal fracture (series 7, image 43). Acute nondisplaced oblique fracture through the lateral malleolus (series 8, image 57). Acute fracture at the base of the first metatarsal with impaction and medial subluxation of the dorsal first metatarsal base with respect to the medial cuneiform. Second through fifth TMT joint alignment is normal. Acute impacted fracture of the second metatarsal neck. Osteopenia.   Joint spaces are preserved. No joint effusion. Ligaments Ligaments are suboptimally evaluated by CT. Muscles and Tendons Grossly intact. Soft tissue Dorsal foot soft tissue swelling. No fluid collection or hematoma. No soft tissue mass. IMPRESSION: 1. Acute nondisplaced fractures through the previously healed distal tibial diaphyseal fracture. 2. Acute minimally angulated and impacted oblique fracture of the mid fibular diaphysis just superior to the previously healed diaphyseal fracture. 3. Acute nondisplaced oblique fracture through the lateral malleolus. 4. Acute fracture at the base of the first metatarsal with impaction and medial subluxation of the dorsal first metatarsal base with respect to the medial cuneiform. Second through fifth TMT joint alignment is normal. 5. Acute impacted fracture of the second metatarsal neck. Electronically Signed   By: Obie Dredge M.D.   On: 09/14/2023 11:44   CT TIBIA FIBULA RIGHT WO CONTRAST  Result Date: 09/14/2023 CLINICAL DATA:  Right lower leg and foot injury. EXAM: CT OF THE RIGHT TIBIA FIBULA WITHOUT CONTRAST CT OF THE RIGHT FOOT WITHOUT CONTRAST TECHNIQUE: Multidetector CT imaging of the right tibia fibula and foot was performed according to the standard protocol. Multiplanar CT image reconstructions were also generated. RADIATION DOSE REDUCTION: This exam was performed according to the departmental dose-optimization program which includes automated exposure control, adjustment of the mA and/or kV according to patient size and/or use of iterative reconstruction technique. COMPARISON:  Right lower leg and foot x-rays from yesterday. FINDINGS: Bones/Joint/Cartilage Chronic, partially united fracture of the distal tibial diaphysis status post ORIF. Hardware is intact. New acute nondisplaced fractures through the previously healed distal tibial diaphyseal fracture (series 7, image 45; series 8, image 32). Acute minimally angulated and impacted oblique fracture of  the mid fibular diaphysis just superior to the previously healed fibular diaphyseal fracture (series 7, image 43). Acute nondisplaced oblique fracture through the lateral malleolus (series 8, image 57). Acute fracture at the base of the first metatarsal with impaction and medial subluxation of the dorsal first metatarsal base with respect to the medial cuneiform. Second through fifth TMT joint alignment is normal. Acute impacted fracture of the second metatarsal neck. Osteopenia.  Joint spaces are preserved. No joint effusion. Ligaments Ligaments are suboptimally evaluated by CT. Muscles and Tendons Grossly intact. Soft tissue  Dorsal foot soft tissue swelling. No fluid collection or hematoma. No soft tissue mass. IMPRESSION: 1. Acute nondisplaced fractures through the previously healed distal tibial diaphyseal fracture. 2. Acute minimally angulated and impacted oblique fracture of the mid fibular diaphysis just superior to the previously healed diaphyseal fracture. 3. Acute nondisplaced oblique fracture through the lateral malleolus. 4. Acute fracture at the base of the first metatarsal with impaction and medial subluxation of the dorsal first metatarsal base with respect to the medial cuneiform. Second through fifth TMT joint alignment is normal. 5. Acute impacted fracture of the second metatarsal neck. Electronically Signed   By: Obie Dredge M.D.   On: 09/14/2023 11:44   Chest Portable 1 View  Result Date: 09/13/2023 CLINICAL DATA:  Preop chest exam EXAM: PORTABLE CHEST 1 VIEW COMPARISON:  08/17/2022 FINDINGS: Low lung volumes accentuate bronchovascular markings. Right basilar scarring. No focal consolidation, pleural effusion, or pneumothorax. Stable cardiomediastinal silhouette. Aortic atherosclerotic calcification. IMPRESSION: Low lung volumes without acute cardiopulmonary disease. Electronically Signed   By: Minerva Fester M.D.   On: 09/13/2023 23:33   DG Foot Complete Right  Result Date:  09/13/2023 CLINICAL DATA:  Right lower extremity pain after fall. Deformity to dorsum of right foot and toes are not aligned. EXAM: RIGHT FOOT COMPLETE - 3+ VIEW; RIGHT TIBIA AND FIBULA - 2 VIEW COMPARISON:  Radiographs 04/26/2020 and 06/19/2015 FINDINGS: Plate screw fixation of the remote fracture of the distal right tibia. Remote fracture deformities of the tibia and fibula. Acute nondisplaced oblique fracture of the mid right fibula. Question additional nondisplaced fracture of the mid to distal right tibia. This is only seen on lateral view and may be acute, chronic, or projection artifact. Acute displaced fracture of the base of the first metatarsal. The fracture line extends into the tarsometatarsal joint and Lisfranc joint. There is dorsal and medial displacement of the dominant distal fragment. Soft tissue swelling over the dorsum of the foot. IMPRESSION: 1. Acute displaced fracture of the base of the first metatarsal. The fracture line extends into the tarsometatarsal joint and Lisfranc joint. 2. Acute nondisplaced oblique fracture of the mid right fibula. 3. Question acute nondisplaced fracture of the mid to distal right tibia. This is only seen on lateral view. Differential considerations include chronic fracture or projection artifact. Consider CT for further evaluation. Electronically Signed   By: Minerva Fester M.D.   On: 09/13/2023 19:06   DG Tibia/Fibula Right  Result Date: 09/13/2023 CLINICAL DATA:  Right lower extremity pain after fall. Deformity to dorsum of right foot and toes are not aligned. EXAM: RIGHT FOOT COMPLETE - 3+ VIEW; RIGHT TIBIA AND FIBULA - 2 VIEW COMPARISON:  Radiographs 04/26/2020 and 06/19/2015 FINDINGS: Plate screw fixation of the remote fracture of the distal right tibia. Remote fracture deformities of the tibia and fibula. Acute nondisplaced oblique fracture of the mid right fibula. Question additional nondisplaced fracture of the mid to distal right tibia. This is only  seen on lateral view and may be acute, chronic, or projection artifact. Acute displaced fracture of the base of the first metatarsal. The fracture line extends into the tarsometatarsal joint and Lisfranc joint. There is dorsal and medial displacement of the dominant distal fragment. Soft tissue swelling over the dorsum of the foot. IMPRESSION: 1. Acute displaced fracture of the base of the first metatarsal. The fracture line extends into the tarsometatarsal joint and Lisfranc joint. 2. Acute nondisplaced oblique fracture of the mid right fibula. 3. Question acute nondisplaced fracture of the mid to  distal right tibia. This is only seen on lateral view. Differential considerations include chronic fracture or projection artifact. Consider CT for further evaluation. Electronically Signed   By: Minerva Fester M.D.   On: 09/13/2023 19:06    Review of Systems  HENT:  Negative for ear discharge, ear pain, hearing loss and tinnitus.   Eyes:  Negative for photophobia and pain.  Respiratory:  Negative for cough and shortness of breath.   Cardiovascular:  Negative for chest pain.  Gastrointestinal:  Negative for abdominal pain, nausea and vomiting.  Genitourinary:  Negative for dysuria, flank pain, frequency and urgency.  Musculoskeletal:  Positive for arthralgias (Right leg and foot). Negative for back pain, myalgias and neck pain.  Neurological:  Negative for dizziness and headaches.  Hematological:  Does not bruise/bleed easily.  Psychiatric/Behavioral:  The patient is not nervous/anxious.    Blood pressure 124/67, pulse 74, temperature 98.4 F (36.9 C), temperature source Oral, resp. rate 17, height 5\' 8"  (1.727 m), weight 127 kg, SpO2 96%. Physical Exam Constitutional:      General: She is not in acute distress.    Appearance: She is well-developed. She is not diaphoretic.  HENT:     Head: Normocephalic and atraumatic.  Eyes:     General: No scleral icterus.       Right eye: No discharge.         Left eye: No discharge.     Conjunctiva/sclera: Conjunctivae normal.  Cardiovascular:     Rate and Rhythm: Normal rate and regular rhythm.  Pulmonary:     Effort: Pulmonary effort is normal. No respiratory distress.  Musculoskeletal:     Cervical back: Normal range of motion.     Comments: RLE No traumatic wounds, ecchymosis, or rash  CAM boot in place  No knee effusion  Knee stable to varus/ valgus and anterior/posterior stress  Sens DPN, SPN, TN intact  Motor EHL, ext, flex, evers 1/5  No significant edema  Skin:    General: Skin is warm and dry.  Neurological:     Mental Status: She is alert.  Psychiatric:        Mood and Affect: Mood normal.        Behavior: Behavior normal.     Assessment/Plan: Right Lisfranc injury -- Plan ORIF today with Dr. Jena Gauss. Please keep NPO. Right tib/fib fx -- Plan non-operative management with [facility]    Freeman Caldron, PA-C Orthopedic Surgery [phone] 09/15/2023, 9:11 AM

## 2023-09-15 NOTE — Progress Notes (Signed)
[facility] Spiritual Care Note  Received call from Jordan [name] to provide update re fractures and surgery. She reports that her pain is well managed and plans to phone again this week when she is resting.   [address] [name], [place], [facility] Pager [phone] Voicemail [phone]

## 2023-09-15 NOTE — H&P (View-Only) (Signed)
Reason for Consult:Right Lisfranc injury Referring Physician: Renaye Rakers Time called: 2542 Time at bedside: 0906   Jordan [name] is an 57 y.o. female.  HPI: Jordan [name] was hurrying to get her mail yesterday when she misstepped and fell. She had immediate right leg/foot pain and could not get up. She was brought to the ED where x-rays showed a right Lisfranc injury and tib/fib fxs and orthopedic surgery was consulted. Due to the complexity of the fractures orthopedic trauma consultation was requested. She lives at home and ambulates with the aid of a rollator.  Past Medical History:  Diagnosis Date   Alcohol abuse    Anemia    patient denies   Bipolar 1 disorder (HCC)    No medications currently   CAP (community acquired pneumonia) 03/17/2015   History of radiation therapy 08/24/19-09/22/19   Vaginal brachytherapy   Dr. 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   Mental disorder    Obesity    PICC line infection 05/17/2015   Sepsis due to Gram negative bacteria (MDR E Coli) 02/18/2015   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HCC)    UTI (lower urinary tract infection)    Vaginal Pap smear, abnormal     Past Surgical History:  Procedure Laterality Date   ABDOMINAL HYSTERECTOMY     CERVICAL CONIZATION W/BX N/A 07/01/2016   Procedure: CONIZATION CERVIX WITH BIOPSY;  Surgeon: Hermina Staggers, MD;  Location: [facility];  Service: Gynecology;  Laterality: N/A;   CHOLECYSTECTOMY     EXTERNAL FIXATION LEG Right 04/09/2020   Procedure: EXTERNAL FIXATION LEG;  Surgeon: Bjorn Pippin, MD;  Location: [facility];  Service: Orthopedics;  Laterality: Right;   HYSTEROSCOPY WITH D & C N/A 01/25/2016   Procedure: DILATATION AND CURETTAGE /HYSTEROSCOPY;  Surgeon: Catalina Antigua, MD;  Location: [facility];  Service: Gynecology;  Laterality: N/A;   LYMPH NODE BIOPSY Bilateral 07/22/2019   Procedure: LYMPH NODE BIOPSY;  Surgeon: Adolphus Birchwood, MD;  Location: [facility];  Service: Gynecology;   Laterality: Bilateral;   OPEN REDUCTION INTERNAL FIXATION (ORIF) TIBIA/FIBULA FRACTURE Right 04/10/2020   Procedure: OPEN REDUCTION INTERNAL FIXATION (ORIF) TIBIA/FIBULA FRACTURE;  Surgeon: Roby Lofts, MD;  Location: [facility];  Service: Orthopedics;  Laterality: Right;   ROBOT ASSISTED MYOMECTOMY N/A 07/22/2019   Procedure: XI ROBOTIC ASSISTED LAPAROSCOPIC RADICAL UPPER VAGINECTOMY, LEFT SALPINECTOMY, RIGHT SALPINGOOOPHERECTOMY;  Surgeon: Adolphus Birchwood, MD;  Location: [facility];  Service: Gynecology;  Laterality: N/A;   VAGINAL HYSTERECTOMY N/A 03/11/2017   Procedure: HYSTERECTOMY VAGINAL WITH MORCELLATION;  Surgeon: Hermina Staggers, MD;  Location: [facility];  Service: Gynecology;  Laterality: N/A;    Family History  Problem Relation Age of Onset   Alcohol abuse Father    Cancer Father    Breast cancer Neg Hx     Social History:  reports that she has never smoked. She has never used smokeless tobacco. She reports that she does not currently use alcohol. She reports that she does not use drugs.  Allergies:  Allergies  Allergen Reactions   Ceftriaxone Anaphylaxis    See progess note. Administration results in hives, throat pain, flushing and SOB.   Ciprofloxacin Swelling    Lips swell, tongue swells, face swells   Citalopram Other (See Comments)    Possible cause of pancytopenia. Swelling of tongue, face and throat   Lamotrigine Other (See Comments)    Possible cause of pancytopenia. Swelling of face, throat and tongue   Sulfa Antibiotics Anaphylaxis  Tramadol Anaphylaxis, Shortness Of Breath and Swelling    Medications: I have reviewed the patient's current medications.  Results for orders placed or performed during the hospital encounter of 09/13/23 (from the past 48 hour(s))  CBC with Differential     Status: Abnormal   Collection Time: 09/13/23  9:28 PM  Result Value Ref Range   WBC 11.3 (H) 4.0 - 10.5 K/uL   RBC 4.49 3.87 - 5.11 MIL/uL   Hemoglobin 13.7 12.0 - 15.0 g/dL   HCT  57.8 46.9 - 62.9 %   MCV 92.4 80.0 - 100.0 fL   MCH 30.5 26.0 - 34.0 pg   MCHC 33.0 30.0 - 36.0 g/dL   RDW 52.8 41.3 - 24.4 %   Platelets 160 150 - 400 K/uL   nRBC 0.0 0.0 - 0.2 %   Neutrophils Relative % 85 %   Neutro Abs 9.6 (H) 1.7 - 7.7 K/uL   Lymphocytes Relative 8 %   Lymphs Abs 0.9 0.7 - 4.0 K/uL   Monocytes Relative 7 %   Monocytes Absolute 0.8 0.1 - 1.0 K/uL   Eosinophils Relative 0 %   Eosinophils Absolute 0.0 0.0 - 0.5 K/uL   Basophils Relative 0 %   Basophils Absolute 0.0 0.0 - 0.1 K/uL   Immature Granulocytes 0 %   Abs Immature Granulocytes 0.05 0.00 - 0.07 K/uL    Comment: Performed at [facility], [address], [place], [place] [address]  Basic metabolic panel     Status: Abnormal   Collection Time: 09/13/23  9:28 PM  Result Value Ref Range   Sodium 138 135 - 145 mmol/L   Potassium 3.6 3.5 - 5.1 mmol/L   Chloride 107 98 - 111 mmol/L   CO2 23 22 - 32 mmol/L   Glucose, Bld 108 (H) 70 - 99 mg/dL    Comment: Glucose reference range applies only to samples taken after fasting for at least 8 hours.   BUN 8 6 - 20 mg/dL   Creatinine, Ser 2.53 0.44 - 1.00 mg/dL   Calcium 8.9 8.9 - 66.4 mg/dL   GFR, Estimated >40 >34 mL/min    Comment: (NOTE) Calculated using the CKD-EPI Creatinine Equation (2021)    Anion gap 8 5 - 15    Comment: Performed at [facility], [address], [place], [place] [address]  Surgical pcr screen     Status: None   Collection Time: 09/14/23  3:39 AM   Specimen: Nasal Mucosa; Nasal Swab  Result Value Ref Range   MRSA, PCR NEGATIVE NEGATIVE   Staphylococcus aureus NEGATIVE NEGATIVE    Comment: (NOTE) The Xpert SA Assay (FDA approved for NASAL specimens in patients 50 years of age and older), is one component of a comprehensive surveillance program. It is not intended to diagnose infection nor to guide or monitor treatment. Performed at [facility], [address] [place], [place] [address]   HIV Antibody (routine  testing w rflx)     Status: None   Collection Time: 09/14/23  6:16 AM  Result Value Ref Range   HIV Screen 4th Generation wRfx Non Reactive Non Reactive    Comment: Performed at [facility], [address], [place], [place] [address]   *Note: Due to a large number of results and/or encounters for the requested time period, some results have not been displayed. A complete set of results can be found in Results Review.    CT FOOT RIGHT WO  CONTRAST  Result Date: 09/14/2023 CLINICAL DATA:  Right lower leg and foot injury. EXAM: CT OF THE RIGHT TIBIA FIBULA WITHOUT CONTRAST CT OF THE RIGHT FOOT WITHOUT CONTRAST TECHNIQUE: Multidetector CT imaging of the right tibia fibula and foot was performed according to the standard protocol. Multiplanar CT image reconstructions were also generated. RADIATION DOSE REDUCTION: This exam was performed according to the departmental dose-optimization program which includes automated exposure control, adjustment of the mA and/or kV according to patient size and/or use of iterative reconstruction technique. COMPARISON:  Right lower leg and foot x-rays from yesterday. FINDINGS: Bones/Joint/Cartilage Chronic, partially united fracture of the distal tibial diaphysis status post ORIF. Hardware is intact. New acute nondisplaced fractures through the previously healed distal tibial diaphyseal fracture (series 7, image 45; series 8, image 32). Acute minimally angulated and impacted oblique fracture of the mid fibular diaphysis just superior to the previously healed fibular diaphyseal fracture (series 7, image 43). Acute nondisplaced oblique fracture through the lateral malleolus (series 8, image 57). Acute fracture at the base of the first metatarsal with impaction and medial subluxation of the dorsal first metatarsal base with respect to the medial cuneiform. Second through fifth TMT joint alignment is normal. Acute impacted fracture of the second metatarsal neck. Osteopenia.   Joint spaces are preserved. No joint effusion. Ligaments Ligaments are suboptimally evaluated by CT. Muscles and Tendons Grossly intact. Soft tissue Dorsal foot soft tissue swelling. No fluid collection or hematoma. No soft tissue mass. IMPRESSION: 1. Acute nondisplaced fractures through the previously healed distal tibial diaphyseal fracture. 2. Acute minimally angulated and impacted oblique fracture of the mid fibular diaphysis just superior to the previously healed diaphyseal fracture. 3. Acute nondisplaced oblique fracture through the lateral malleolus. 4. Acute fracture at the base of the first metatarsal with impaction and medial subluxation of the dorsal first metatarsal base with respect to the medial cuneiform. Second through fifth TMT joint alignment is normal. 5. Acute impacted fracture of the second metatarsal neck. Electronically Signed   By: Obie Dredge M.D.   On: 09/14/2023 11:44   CT TIBIA FIBULA RIGHT WO CONTRAST  Result Date: 09/14/2023 CLINICAL DATA:  Right lower leg and foot injury. EXAM: CT OF THE RIGHT TIBIA FIBULA WITHOUT CONTRAST CT OF THE RIGHT FOOT WITHOUT CONTRAST TECHNIQUE: Multidetector CT imaging of the right tibia fibula and foot was performed according to the standard protocol. Multiplanar CT image reconstructions were also generated. RADIATION DOSE REDUCTION: This exam was performed according to the departmental dose-optimization program which includes automated exposure control, adjustment of the mA and/or kV according to patient size and/or use of iterative reconstruction technique. COMPARISON:  Right lower leg and foot x-rays from yesterday. FINDINGS: Bones/Joint/Cartilage Chronic, partially united fracture of the distal tibial diaphysis status post ORIF. Hardware is intact. New acute nondisplaced fractures through the previously healed distal tibial diaphyseal fracture (series 7, image 45; series 8, image 32). Acute minimally angulated and impacted oblique fracture of  the mid fibular diaphysis just superior to the previously healed fibular diaphyseal fracture (series 7, image 43). Acute nondisplaced oblique fracture through the lateral malleolus (series 8, image 57). Acute fracture at the base of the first metatarsal with impaction and medial subluxation of the dorsal first metatarsal base with respect to the medial cuneiform. Second through fifth TMT joint alignment is normal. Acute impacted fracture of the second metatarsal neck. Osteopenia.  Joint spaces are preserved. No joint effusion. Ligaments Ligaments are suboptimally evaluated by CT. Muscles and Tendons Grossly intact. Soft tissue  Dorsal foot soft tissue swelling. No fluid collection or hematoma. No soft tissue mass. IMPRESSION: 1. Acute nondisplaced fractures through the previously healed distal tibial diaphyseal fracture. 2. Acute minimally angulated and impacted oblique fracture of the mid fibular diaphysis just superior to the previously healed diaphyseal fracture. 3. Acute nondisplaced oblique fracture through the lateral malleolus. 4. Acute fracture at the base of the first metatarsal with impaction and medial subluxation of the dorsal first metatarsal base with respect to the medial cuneiform. Second through fifth TMT joint alignment is normal. 5. Acute impacted fracture of the second metatarsal neck. Electronically Signed   By: Obie Dredge M.D.   On: 09/14/2023 11:44   Chest Portable 1 View  Result Date: 09/13/2023 CLINICAL DATA:  Preop chest exam EXAM: PORTABLE CHEST 1 VIEW COMPARISON:  08/17/2022 FINDINGS: Low lung volumes accentuate bronchovascular markings. Right basilar scarring. No focal consolidation, pleural effusion, or pneumothorax. Stable cardiomediastinal silhouette. Aortic atherosclerotic calcification. IMPRESSION: Low lung volumes without acute cardiopulmonary disease. Electronically Signed   By: Minerva Fester M.D.   On: 09/13/2023 23:33   DG Foot Complete Right  Result Date:  09/13/2023 CLINICAL DATA:  Right lower extremity pain after fall. Deformity to dorsum of right foot and toes are not aligned. EXAM: RIGHT FOOT COMPLETE - 3+ VIEW; RIGHT TIBIA AND FIBULA - 2 VIEW COMPARISON:  Radiographs 04/26/2020 and 06/19/2015 FINDINGS: Plate screw fixation of the remote fracture of the distal right tibia. Remote fracture deformities of the tibia and fibula. Acute nondisplaced oblique fracture of the mid right fibula. Question additional nondisplaced fracture of the mid to distal right tibia. This is only seen on lateral view and may be acute, chronic, or projection artifact. Acute displaced fracture of the base of the first metatarsal. The fracture line extends into the tarsometatarsal joint and Lisfranc joint. There is dorsal and medial displacement of the dominant distal fragment. Soft tissue swelling over the dorsum of the foot. IMPRESSION: 1. Acute displaced fracture of the base of the first metatarsal. The fracture line extends into the tarsometatarsal joint and Lisfranc joint. 2. Acute nondisplaced oblique fracture of the mid right fibula. 3. Question acute nondisplaced fracture of the mid to distal right tibia. This is only seen on lateral view. Differential considerations include chronic fracture or projection artifact. Consider CT for further evaluation. Electronically Signed   By: Minerva Fester M.D.   On: 09/13/2023 19:06   DG Tibia/Fibula Right  Result Date: 09/13/2023 CLINICAL DATA:  Right lower extremity pain after fall. Deformity to dorsum of right foot and toes are not aligned. EXAM: RIGHT FOOT COMPLETE - 3+ VIEW; RIGHT TIBIA AND FIBULA - 2 VIEW COMPARISON:  Radiographs 04/26/2020 and 06/19/2015 FINDINGS: Plate screw fixation of the remote fracture of the distal right tibia. Remote fracture deformities of the tibia and fibula. Acute nondisplaced oblique fracture of the mid right fibula. Question additional nondisplaced fracture of the mid to distal right tibia. This is only  seen on lateral view and may be acute, chronic, or projection artifact. Acute displaced fracture of the base of the first metatarsal. The fracture line extends into the tarsometatarsal joint and Lisfranc joint. There is dorsal and medial displacement of the dominant distal fragment. Soft tissue swelling over the dorsum of the foot. IMPRESSION: 1. Acute displaced fracture of the base of the first metatarsal. The fracture line extends into the tarsometatarsal joint and Lisfranc joint. 2. Acute nondisplaced oblique fracture of the mid right fibula. 3. Question acute nondisplaced fracture of the mid to  distal right tibia. This is only seen on lateral view. Differential considerations include chronic fracture or projection artifact. Consider CT for further evaluation. Electronically Signed   By: Minerva Fester M.D.   On: 09/13/2023 19:06    Review of Systems  HENT:  Negative for ear discharge, ear pain, hearing loss and tinnitus.   Eyes:  Negative for photophobia and pain.  Respiratory:  Negative for cough and shortness of breath.   Cardiovascular:  Negative for chest pain.  Gastrointestinal:  Negative for abdominal pain, nausea and vomiting.  Genitourinary:  Negative for dysuria, flank pain, frequency and urgency.  Musculoskeletal:  Positive for arthralgias (Right leg and foot). Negative for back pain, myalgias and neck pain.  Neurological:  Negative for dizziness and headaches.  Hematological:  Does not bruise/bleed easily.  Psychiatric/Behavioral:  The patient is not nervous/anxious.    Blood pressure 124/67, pulse 74, temperature 98.4 F (36.9 C), temperature source Oral, resp. rate 17, height 5\' 8"  (1.727 m), weight 127 kg, SpO2 96%. Physical Exam Constitutional:      General: She is not in acute distress.    Appearance: She is well-developed. She is not diaphoretic.  HENT:     Head: Normocephalic and atraumatic.  Eyes:     General: No scleral icterus.       Right eye: No discharge.         Left eye: No discharge.     Conjunctiva/sclera: Conjunctivae normal.  Cardiovascular:     Rate and Rhythm: Normal rate and regular rhythm.  Pulmonary:     Effort: Pulmonary effort is normal. No respiratory distress.  Musculoskeletal:     Cervical back: Normal range of motion.     Comments: RLE No traumatic wounds, ecchymosis, or rash  CAM boot in place  No knee effusion  Knee stable to varus/ valgus and anterior/posterior stress  Sens DPN, SPN, TN intact  Motor EHL, ext, flex, evers 1/5  No significant edema  Skin:    General: Skin is warm and dry.  Neurological:     Mental Status: She is alert.  Psychiatric:        Mood and Affect: Mood normal.        Behavior: Behavior normal.     Assessment/Plan: Right Lisfranc injury -- Plan ORIF today with Dr. Jena Gauss. Please keep NPO. Right tib/fib fx -- Plan non-operative management with [facility]    Freeman Caldron, PA-C Orthopedic Surgery [phone] 09/15/2023, 9:11 AM

## 2023-09-15 NOTE — Interval H&P Note (Signed)
History and Physical Interval Note:  09/15/2023 10:21 AM  Jordan [name]  has presented today for surgery, with the diagnosis of Right lisfranc.  The various methods of treatment have been discussed with the patient and family. After consideration of risks, benefits and other options for treatment, the patient has consented to  Procedure(s): OPEN REDUCTION INTERNAL FIXATION (ORIF) FOOT LISFRANC FRACTURE (Right) as a surgical intervention.  The patient's history has been reviewed, patient examined, no change in status, stable for surgery.  I have reviewed the patient's chart and labs.  Questions were answered to the patient's satisfaction.     Caryn Bee P Brentt Fread

## 2023-09-15 NOTE — Transfer of Care (Signed)
Immediate Anesthesia Transfer of Care Note  Patient: Jordan [name]  Procedure(s) Performed: OPEN REDUCTION INTERNAL FIXATION (ORIF) FOOT LISFRANC FRACTURE (Right: Foot)  Patient Location: PACU  Anesthesia Type:General  Level of Consciousness: sedated  Airway & Oxygen Therapy: Patient Spontanous Breathing and Patient connected to face mask oxygen  Post-op Assessment: Report given to RN and Post -op Vital signs reviewed and stable  Post vital signs: Reviewed and stable  Last Vitals:  Vitals Value Taken Time  BP 104/61 09/15/23 1230  Temp    Pulse 65 09/15/23 1231  Resp 17 09/15/23 1231  SpO2 98 % 09/15/23 1231  Vitals shown include unfiled device data.  Last Pain:  Vitals:   09/15/23 1000  TempSrc:   PainSc: 8       Patients Stated Pain Goal: 3 (09/14/23 0726)  Complications: No notable events documented.

## 2023-09-15 NOTE — Op Note (Signed)
Orthopaedic Surgery Operative Note (CSN: [id number] ) Date of Surgery: 09/15/2023  Admit Date: 09/13/2023   Diagnoses: Pre-Op Diagnoses: Right 1st tarsometatarsal fracture/dislocation Right tibia refracture and right fibula fracture  Post-Op Diagnosis: Same  Procedures: CPT 28615-Open reduction internal fixation of right 1st tarsometatarsal fracture/dislocation CPT 27752-Closed treatment of right tibia fracture  Surgeons : Primary: Jordan Lofts, MD  Assistant: Jordan Southward, PA-C  Location: OR 3   Anesthesia: General with regional block   Antibiotics: Ancef 2g preop with 1 gm vancomycin powder placed topically   Tourniquet time: None    Estimated Blood Loss: 50 mL  Complications:* No complications entered in OR log *   Specimens:* No specimens in log *   Implants: Implant Name Type Inv. Item Serial No. Manufacturer Lot No. LRB No. Used Action  PLATE SNGL LISFRANC HOLE 3 - [id number] Plate PLATE SNGL LISFRANC HOLE 3  [employer](ORTH,TRAU,BIO,SG) [id number] Right 1 Implanted  SCREW STRATUM NL LP ST 3.5X22 - [id number] Screw SCREW STRATUM NL LP ST 3.5X22  [employer](ORTH,TRAU,BIO,SG) [id number] Right 1 Implanted  SCREW LP STRM NL 3.5X26 ST - [id number] Screw SCREW LP STRM NL 3.5X26 ST  [employer]ON(ORTH,TRAU,BIO,SG) [id number] Right 1 Implanted  SCREW NL LP STRATUM 3.5X16 - [id number] Screw SCREW NL LP STRATUM 3.5X16  [employer](ORTH,TRAU,BIO,SG) [id number] Right 1 Implanted  SCREW NONLOCK ST 3.5X28 - [id number] Screw SCREW NONLOCK ST 3.5X28  [employer](ORTH,TRAU,BIO,SG) [id number] Right 1 Implanted     Indications for Surgery: 57 year old female who tripped and fell sustaining a right first metatarsal fracture dislocation as well as a refracture to a previous healed tibial shaft fracture.  According to CT scan and x-rays the hardware that was in her tibia was still in place without any fracture.  Felt that she was indicated for open reduction internal fixation of her right first TMT  joint.  In regards to the tibia fracture I recommend stressing under fluoroscopic imaging and decide whether we pursue nonoperative and operative management.  Risks and benefits were discussed with the patient.  Risks include but not limited to bleeding, infection, malunion, nonunion, hardware failure, hardware rotation, nerve and blood vessel injury, DVT, even the possibility anesthetic complications.  She agreed to proceed with surgery consent was obtained.  Operative Findings: 1.  Open reduction internal fixation of right first tarsometatarsal fracture dislocation using the stratum 3 hole Lisfranc plate 2.  Stress examination under fluoroscopy of right tibia shaft fracture showing no fractured hardware and no significant and gross instability of the tibia.  Procedure: The patient was identified in the preoperative holding area. Consent was confirmed with the patient and their family and all questions were answered. The operative extremity was marked after confirmation with the patient. she was then brought back to the operating room by our anesthesia colleagues.  She was placed under general anesthetic and carefully transferred over to radiolucent flattop table.  The right lower extremity was prepped and draped in usual sterile fashion.  A timeout was performed to verify the patient, the procedure, and the extremity.  Preoperative antibiotics were dosed.  Fluoroscopic imaging of the foot showed the fracture dislocation the first tarsometatarsal joint.  I made incision between the first and second tarsometatarsal joints and carried it down through skin and subcutaneous tissue.  Identified the EHL and mobilized this and protected this throughout the case.  I did not expose the neurovascular bundle.  I then incised through the periosteum to visualize the first tarsometatarsal joint.  I then used a freer elevator to  manipulate the metatarsal base back into reduction and back into the joint.  I then  provisionally held it with a 1.6 mm K wire.  I confirmed reduction with fluoroscopy.  Did not appear that any of the other tarsometatarsal joints were disrupted.  As result I chose to use a 3 hole stratum Lisfranc plate.  I position the guide appropriately and then drilled guide holes for the tines of the plate in the medial cuneiform.  The plate was then placed.  I placed a nonlocking screw into the medial cuneiform.  I then placed 3 screws into the first metatarsal.  Final fluoroscopic imaging of the foot was obtained.  I then turned my attention to stress examination of the tibia.  The lateral view showed that the plate was in place with no signs of any hardware failure.  There were no cracks or breaks through the plate.  I then obtained AP and oblique views of the ankle and distal tibia and stressed to the live under fluoroscopic imaging with manipulation of the tibia.  The plate appears to be intact without any instability of the tibia.  There was some movement of the fibula however there is not any gross instability overall of the lower extremity.  The incision of the foot was then irrigated and closed with 2-0 Monocryl and 3-0 nylon.  Sterile dressings were applied.  The patient was then placed in a well-padded short leg splint.  Patient was then awoke from anesthesia and taken to the PACU in stable condition.  Post Op Plan/Instructions: Patient be nonweightbearing to the right lower extremity.  She will receive postoperative Ancef.  She will receive Lovenox for DVT prophylaxis and discharged on an oral DOAC.  Will have her mobilize with physical and Occupational Therapy.  I was present and performed the entire surgery.  Jordan Southward, PA-C did assist me throughout the case. An assistant was necessary given the difficulty in approach, maintenance of reduction and ability to instrument the fracture.   Jordan Merle, MD [facility]

## 2023-09-15 NOTE — Anesthesia Procedure Notes (Signed)
Anesthesia Regional Block: Adductor canal block   Pre-Anesthetic Checklist: , timeout performed,  Correct Patient, Correct Site, Correct Laterality,  Correct Procedure, Correct Position, site marked,  Risks and benefits discussed,  Surgical consent,  Pre-op evaluation,  At surgeon's request and post-op pain management  Laterality: Lower and Right  Prep: chloraprep       Needles:  Injection technique: Single-shot  Needle Type: Echogenic Needle     Needle Length: 9cm  Needle Gauge: 20   Needle insertion depth: 3.5 cm   Additional Needles:   Procedures:,,,, ultrasound used (permanent image in chart),,    Narrative:  Start time: 09/15/2023 9:40 AM End time: 09/15/2023 9:48 AM Injection made incrementally with aspirations every 5 mL.  Performed by: Personally  Anesthesiologist: Leilani Able, MD

## 2023-09-16 ENCOUNTER — Encounter (HOSPITAL_COMMUNITY): Payer: Self-pay | Admitting: Student

## 2023-09-16 ENCOUNTER — Ambulatory Visit: Payer: MEDICAID | Admitting: Clinical

## 2023-09-16 DIAGNOSIS — F3160 Bipolar disorder, current episode mixed, unspecified: Secondary | ICD-10-CM

## 2023-09-16 DIAGNOSIS — F4381 Prolonged grief disorder: Secondary | ICD-10-CM

## 2023-09-16 DIAGNOSIS — S92311A Displaced fracture of first metatarsal bone, right foot, initial encounter for closed fracture: Secondary | ICD-10-CM | POA: Diagnosis not present

## 2023-09-16 DIAGNOSIS — Z658 Other specified problems related to psychosocial circumstances: Secondary | ICD-10-CM

## 2023-09-16 DIAGNOSIS — S82401A Unspecified fracture of shaft of right fibula, initial encounter for closed fracture: Secondary | ICD-10-CM | POA: Diagnosis not present

## 2023-09-16 DIAGNOSIS — S82201A Unspecified fracture of shaft of right tibia, initial encounter for closed fracture: Secondary | ICD-10-CM | POA: Diagnosis not present

## 2023-09-16 LAB — BASIC METABOLIC PANEL
Anion gap: 8 (ref 5–15)
BUN: 17 mg/dL (ref 6–20)
CO2: 24 mmol/L (ref 22–32)
Calcium: 8.9 mg/dL (ref 8.9–10.3)
Chloride: 104 mmol/L (ref 98–111)
Creatinine, Ser: 0.89 mg/dL (ref 0.44–1.00)
GFR, Estimated: >60 mL/min (ref >60)
Glucose, Bld: 159 mg/dL — ABNORMAL HIGH (ref 70–99)
Potassium: 4.5 mmol/L (ref 3.5–5.1)
Sodium: 136 mmol/L (ref 135–145)

## 2023-09-16 LAB — CBC
HCT: 34.8 % — ABNORMAL LOW (ref 36.0–46.0)
Hemoglobin: 12.3 g/dL (ref 12.0–15.0)
MCH: 31.5 pg (ref 26.0–34.0)
MCHC: 35.3 g/dL (ref 30.0–36.0)
MCV: 89.2 fL (ref 80.0–100.0)
Platelets: 138 10*3/uL — ABNORMAL LOW (ref 150–400)
RBC: 3.9 MIL/uL (ref 3.87–5.11)
RDW: 12.5 % (ref 11.5–15.5)
WBC: 8.5 10*3/uL (ref 4.0–10.5)
nRBC: 0 % (ref 0.0–0.2)

## 2023-09-16 LAB — VITAMIN D 25 HYDROXY (VIT D DEFICIENCY, FRACTURES): Vit D, 25-Hydroxy: 33.82 ng/mL (ref 30–100)

## 2023-09-16 MED ORDER — HYDROMORPHONE HCL 1 MG/ML IJ SOLN: 1.0000 mg | INTRAMUSCULAR | Status: DC | PRN

## 2023-09-16 MED ORDER — KETOROLAC TROMETHAMINE 15 MG/ML IJ SOLN
15.0000 mg | Freq: Four times a day (QID) | INTRAMUSCULAR | Status: DC
Start: 2023-09-16 — End: 2023-09-19
  Administered 2023-09-16 – 2023-09-17 (×4): 15 mg via INTRAVENOUS
  Filled 2023-09-16 (×4): qty 1

## 2023-09-16 MED ORDER — ACETAMINOPHEN 325 MG PO TABS
650.0000 mg | ORAL_TABLET | Freq: Four times a day (QID) | ORAL | Status: DC
  Administered 2023-09-16 – 2023-09-17 (×3): 650 mg via ORAL
  Filled 2023-09-16 (×3): qty 2

## 2023-09-16 NOTE — Progress Notes (Signed)
[facility]  PROGRESS NOTE  Adrita Meyerhoff AOZ:[id number] DOB: Jun 06, 1966 DOA: 09/13/2023 PCP: Courtney Paris, NP   Brief HPI:   57 y.o. female with medical history significant of BPD1, CPS, morbid obesity.   Pt in to ED after mechanical fall.  Injured R lower leg and foot in fall.  Inability to bear wt or ambulate following fall.  Pain severe.    Assessment/Plan:   Closed fracture of right tibia and fibula Displaced R first metatarsal fx. Non-displaced preiprosthetic R tibia fx, right fibula fracture -Orthopedics consulted -Underwent ORIF right first tarsometatarsal fracture/dislocation -Closed treatment of right tibia fracture -Continue pain management with oxycodone 15 mg every 4 hours as needed -Continue Dilaudid 0.5 mg IV every 2 hours as needed -PT/OT   Chronic pain -Continue oxycodone as above   Morbid obesity with BMI of 40.0-44.9, adult (HCC) Consider starting pt on Zepbound or Wegovy as outpt if these can be made financially viable.      Medications     acetaminophen  650 mg Oral Q6H   divalproex  250 mg Oral QHS   docusate sodium  100 mg Oral BID   enoxaparin (LOVENOX) injection  40 mg Subcutaneous Q24H   feeding supplement  237 mL Oral BID BM   ketorolac  15 mg Intravenous Q6H   multivitamin with minerals  1 tablet Oral Daily   venlafaxine XR  75 mg Oral Q breakfast     Data Reviewed:   CBG:  No results for input(s): "GLUCAP" in the last 168 hours.  SpO2: 97 % O2 Flow Rate (L/min): 0 L/min    Vitals:   09/15/23 1600 09/15/23 1931 09/16/23 0502 09/16/23 0804  BP: 130/68 (!) 97/57 115/71 (!) 95/50  Pulse:  71 67 73  Resp:  18 18 18   Temp:  (!) 97.5 F (36.4 C) 98 F (36.7 C) 98.4 F (36.9 C)  TempSrc:  Oral Oral Oral  SpO2:  91% 97%   Weight:      Height:          Data Reviewed:  Basic Metabolic Panel: Recent Labs  Lab 09/13/23 2128 09/15/23 1458 09/16/23 0613  NA 138  --  136  K 3.6  --  4.5  CL 107  --  104  CO2 23  --   24  GLUCOSE 108*  --  159*  BUN 8  --  17  CREATININE 0.71 0.67 0.89  CALCIUM 8.9  --  8.9    CBC: Recent Labs  Lab 09/13/23 2128 09/15/23 1458 09/16/23 0613  WBC 11.3* 6.4 8.5  NEUTROABS 9.6*  --   --   HGB 13.7 13.0 12.3  HCT 41.5 36.8 34.8*  MCV 92.4 89.3 89.2  PLT 160 120* 138*    LFT No results for input(s): "AST", "ALT", "ALKPHOS", "BILITOT", "PROT", "ALBUMIN" in the last 168 hours.   Antibiotics: Anti-infectives (From admission, onward)    Start     Dose/Rate Route Frequency Ordered Stop   09/15/23 1800  ceFAZolin (ANCEF) IVPB 2g/100 mL premix        2 g 200 mL/hr over 30 Minutes Intravenous Every 8 hours 09/15/23 1403 09/16/23 2159   09/15/23 1210  vancomycin (VANCOCIN) powder  Status:  Discontinued          As needed 09/15/23 1217 09/15/23 1223        DVT prophylaxis: SCDs  Code Status: Full code  Family Communication: No family at bedside   CONSULTS orthopedics   Subjective  Denies pain.   Objective    Physical Examination:  General-appears in no acute distress Heart-S1-S2, regular, no murmur auscultated Lungs-clear to auscultation bilaterally, no wheezing or crackles auscultated Abdomen-soft, nontender, no organomegaly Extremities-no edema in the lower extremities Neuro-alert, oriented x3, no focal deficit noted   Status is: Inpatient:          Meredeth Ide   [facility] If 7PM-7AM, please contact night-coverage at [email or web address], Office  [phone]   09/16/2023, 9:44 AM  LOS: 3 days

## 2023-09-16 NOTE — Evaluation (Signed)
Physical Therapy Evaluation Patient Details Name: Jordan [name] MRN: [id number] DOB: August 27, 1966 Today's Date: 09/16/2023  History of Present Illness  Pt is a 57 y/o female presenting on 11/23 after fall.  Found with R 1st tarsometatarsal fx, R tibia refracture and R fibula fx.  11/25 S/P ORIF 1st tarsometatarsal fx and closed treatment of R tibia fx. PMH includes: alcohol abuse, anemia, bipolar 1, CAP, sepsis, UTI, obesity.  Clinical Impression  Pt presents with admitting diagnosis above. Pt today was able to transfer to chair with Min A via lateral scoot to drop arm recliner. Attempted to stand and pivot to chair however unable once standing with +2 Min A RW. PTA pt reports that she lived alone in an apartmetn fully independent however did demonstrate some apparent cognitive deficits during session today (see OT note for further details). Patient will benefit from continued inpatient follow up therapy, <3 hours/day. PT will continue to follow.       If plan is discharge home, recommend the following: Two people to help with walking and/or transfers;A lot of help with bathing/dressing/bathroom;Assistance with cooking/housework;Direct supervision/assist for medications management;Assist for transportation;Help with stairs or ramp for entrance;Supervision due to cognitive status   Can travel by private vehicle   No    Equipment Recommendations Other (comment) (Per accepting facility)  Recommendations for Other Services  Rehab consult (Per patient request)    Functional Status Assessment Patient has had a recent decline in their functional status and demonstrates the ability to make significant improvements in function in a reasonable and predictable amount of time.     Precautions / Restrictions Precautions Precautions: Fall Restrictions Weight Bearing Restrictions: Yes RLE Weight Bearing: Non weight bearing      Mobility  Bed Mobility Overal bed mobility: Needs Assistance Bed  Mobility: Supine to Sit, Sit to Supine     Supine to sit: Supervision Sit to supine: Supervision   General bed mobility comments: for safety    Transfers Overall transfer level: Needs assistance Equipment used: Rolling walker (2 wheels) Transfers: Sit to/from Stand, Bed to chair/wheelchair/BSC Sit to Stand: From elevated surface, +2 physical assistance, Min assist          Lateral/Scoot Transfers: Min assist General transfer comment: Pt was able to stand with +2 Min A from very elevated bed however unable to hop. Opted for lateral scoot to chair with drop arm which pt was able to do with Min A however required Mod cues for proper sequencing.    Ambulation/Gait               General Gait Details: unable  Stairs            Wheelchair Mobility     Tilt Bed    Modified Rankin (Stroke Patients Only)       Balance Overall balance assessment: Needs assistance Sitting-balance support: No upper extremity supported, Feet supported Sitting balance-Leahy Scale: Fair     Standing balance support: Bilateral upper extremity supported, During functional activity Standing balance-Leahy Scale: Poor Standing balance comment: relies on BUE and external support, max cueing to keep R LE NWB                             Pertinent Vitals/Pain Pain Assessment Pain Assessment: No/denies pain    Home Living Family/patient expects to be discharged to:: Private residence Living Arrangements: Alone Available Help at Discharge: Neighbor;Available PRN/intermittently Type of Home: Apartment Home Access: Level entry  Home Layout: One level Home Equipment: Administrator (4 wheels)      Prior Function Prior Level of Function : Independent/Modified Independent             Mobility Comments: ind with no AD, if in pain used rollator ADLs Comments: using [employer] to grocery, medicaid transportation to pharmacy, drs office      Extremity/Trunk Assessment   Upper Extremity Assessment Upper Extremity Assessment: Overall WFL for tasks assessed    Lower Extremity Assessment Lower Extremity Assessment: RLE deficits/detail RLE Deficits / Details: R tib/fib fx       Communication   Communication Communication: Difficulty following commands/understanding Following commands: Follows one step commands consistently;Follows multi-step commands inconsistently Cueing Techniques: Verbal cues;Tactile cues;Visual cues  Cognition Arousal: Alert Behavior During Therapy: Impulsive, Anxious Overall Cognitive Status: No family/caregiver present to determine baseline cognitive functioning Area of Impairment: Attention, Memory, Following commands, Awareness, Safety/judgement, Problem solving                   Current Attention Level: Focused Memory: Decreased recall of precautions, Decreased short-term memory Following Commands: Follows one step commands consistently, Follows one step commands with increased time, Follows multi-step commands inconsistently Safety/Judgement: Decreased awareness of safety, Decreased awareness of deficits Awareness: Emergent Problem Solving: Slow processing, Decreased initiation, Difficulty sequencing, Requires verbal cues, Requires tactile cues General Comments: patient oriented and following simple commands, but requires frequent redirection to task, cueing for safety and demonstrates difficulty following mulitple step commands. Requires repetition throughout session. Unsure if this is baseline.        General Comments General comments (skin integrity, edema, etc.): VSS    Exercises     Assessment/Plan    PT Assessment Patient needs continued PT services  PT Problem List Decreased strength;Decreased range of motion;Decreased activity tolerance;Decreased balance;Decreased mobility;Decreased coordination;Decreased cognition;Decreased knowledge of use of DME;Decreased safety  awareness;Decreased knowledge of precautions;Cardiopulmonary status limiting activity;Pain       PT Treatment Interventions DME instruction;Gait training;Functional mobility training;Stair training;Therapeutic activities;Therapeutic exercise;Balance training;Neuromuscular re-education;Cognitive remediation;Patient/family education    PT Goals (Current goals can be found in the Care Plan section)  Acute Rehab PT Goals Patient Stated Goal: to go home PT Goal Formulation: With patient Time For Goal Achievement: 09/30/23 Potential to Achieve Goals: Fair    Frequency Min 1X/week     Co-evaluation               AM-PAC PT "6 Clicks" Mobility  Outcome Measure Help needed turning from your back to your side while in a flat bed without using bedrails?: A Little Help needed moving from lying on your back to sitting on the side of a flat bed without using bedrails?: A Little Help needed moving to and from a bed to a chair (including a wheelchair)?: A Lot Help needed standing up from a chair using your arms (e.g., wheelchair or bedside chair)?: A Lot Help needed to walk in hospital room?: Total Help needed climbing 3-5 steps with a railing? : Total 6 Click Score: 12    End of Session Equipment Utilized During Treatment: Gait belt Activity Tolerance: Patient tolerated treatment well Patient left: in chair;with call bell/phone within reach;with chair alarm set Nurse Communication: Mobility status PT Visit Diagnosis: Other abnormalities of gait and mobility (R26.89)    Time: 0981-1914 PT Time Calculation (min) (ACUTE ONLY): 18 min   Charges:   PT Evaluation $PT Eval Moderate Complexity: 1 Mod   PT General Charges $$ ACUTE PT VISIT: 1 Visit  Lonell Face, DPT Acute Rehab Services [phone]   Gladys Damme 09/16/2023, 4:29 PM

## 2023-09-16 NOTE — Plan of Care (Signed)
Patient ID: Jordan [name], female   DOB: December 03, 1965, 57 y.o.   MRN: [id number]  Problem: Education: Goal: Knowledge of General Education information will improve Description: Including pain rating scale, medication(s)/side effects and non-pharmacologic comfort measures Outcome: Progressing   Problem: Health Behavior/Discharge Planning: Goal: Ability to manage health-related needs will improve Outcome: Progressing   Problem: Clinical Measurements: Goal: Ability to maintain clinical measurements within normal limits will improve Outcome: Progressing Goal: Will remain free from infection Outcome: Progressing Goal: Diagnostic test results will improve Outcome: Progressing Goal: Respiratory complications will improve Outcome: Progressing Goal: Cardiovascular complication will be avoided Outcome: Progressing   Problem: Activity: Goal: Risk for activity intolerance will decrease Outcome: Progressing   Problem: Nutrition: Goal: Adequate nutrition will be maintained Outcome: Progressing   Problem: Coping: Goal: Level of anxiety will decrease Outcome: Progressing   Problem: Elimination: Goal: Will not experience complications related to bowel motility Outcome: Progressing Goal: Will not experience complications related to urinary retention Outcome: Progressing   Problem: Pain Management: Goal: General experience of comfort will improve Outcome: Progressing   Problem: Safety: Goal: Ability to remain free from injury will improve Outcome: Progressing   Problem: Skin Integrity: Goal: Risk for impaired skin integrity will decrease Outcome: Progressing    Lidia Collum, RN

## 2023-09-16 NOTE — TOC CAGE-AID Note (Signed)
Transition of Care [facility]) - CAGE-AID Screening   Patient Details  Name: Jordan [name] MRN: [id number] Date of Birth: January 15, 1966  Transition of [facility]) CM/SW Contact:    Katha Hamming, RN Phone Number: 09/16/2023, 8:33 PM   Clinical Narrative:  Reports no alcohol/drug use, no resources indicated.  CAGE-AID Screening:    Have You Ever Felt You Ought to Cut Down on Your Drinking or Drug Use?: No Have People Annoyed You By Critizing Your Drinking Or Drug Use?: No Have You Felt Bad Or Guilty About Your Drinking Or Drug Use?: No Have You Ever Had a Drink or Used Drugs First Thing In The Morning to Steady Your Nerves or to Get Rid of a Hangover?: No CAGE-AID Score: 0  Substance Abuse Education Offered: No

## 2023-09-16 NOTE — Progress Notes (Signed)
Orthopaedic Trauma Progress Note  SUBJECTIVE: Doing well this morning.  Pain controlled.  Regional nerve block still partially in place.  Patient has not been up out of bed yet since surgery.  No chest pain. No SOB. No nausea/vomiting. No other complaints.   OBJECTIVE:  Vitals:   09/16/23 0502 09/16/23 0804  BP: 115/71 (!) 95/50  Pulse: 67 73  Resp: 18 18  Temp: 98 F (36.7 C) 98.4 F (36.9 C)  SpO2: 97%     General: Sitting up in bed, no acute distress Respiratory: No increased work of breathing.  RLE: Well-padded, well-fitting splint in place, remains clean, dry, intact.  Nontender above splint.  Endorses sensation to light touch over the toes.  Painless knee motion.  No motor function to the toes due to regional nerve blocks to being in place.  Toes are warm well-perfused  IMAGING: Stable post op imaging.   LABS:  Results for orders placed or performed during the hospital encounter of 09/13/23 (from the past 24 hour(s))  CBC     Status: Abnormal   Collection Time: 09/15/23  2:58 PM  Result Value Ref Range   WBC 6.4 4.0 - 10.5 K/uL   RBC 4.12 3.87 - 5.11 MIL/uL   Hemoglobin 13.0 12.0 - 15.0 g/dL   HCT 95.6 21.3 - 08.6 %   MCV 89.3 80.0 - 100.0 fL   MCH 31.6 26.0 - 34.0 pg   MCHC 35.3 30.0 - 36.0 g/dL   RDW 57.8 46.9 - 62.9 %   Platelets 120 (L) 150 - 400 K/uL   nRBC 0.0 0.0 - 0.2 %  Creatinine, serum     Status: None   Collection Time: 09/15/23  2:58 PM  Result Value Ref Range   Creatinine, Ser 0.67 0.44 - 1.00 mg/dL   GFR, Estimated >52 >84 mL/min  CBC     Status: Abnormal   Collection Time: 09/16/23  6:13 AM  Result Value Ref Range   WBC 8.5 4.0 - 10.5 K/uL   RBC 3.90 3.87 - 5.11 MIL/uL   Hemoglobin 12.3 12.0 - 15.0 g/dL   HCT 13.2 (L) 44.0 - 10.2 %   MCV 89.2 80.0 - 100.0 fL   MCH 31.5 26.0 - 34.0 pg   MCHC 35.3 30.0 - 36.0 g/dL   RDW 72.5 36.6 - 44.0 %   Platelets 138 (L) 150 - 400 K/uL   nRBC 0.0 0.0 - 0.2 %  Basic metabolic panel     Status: Abnormal    Collection Time: 09/16/23  6:13 AM  Result Value Ref Range   Sodium 136 135 - 145 mmol/L   Potassium 4.5 3.5 - 5.1 mmol/L   Chloride 104 98 - 111 mmol/L   CO2 24 22 - 32 mmol/L   Glucose, Bld 159 (H) 70 - 99 mg/dL   BUN 17 6 - 20 mg/dL   Creatinine, Ser 3.47 0.44 - 1.00 mg/dL   Calcium 8.9 8.9 - 42.5 mg/dL   GFR, Estimated >95 >63 mL/min   Anion gap 8 5 - 15   *Note: Due to a large number of results and/or encounters for the requested time period, some results have not been displayed. A complete set of results can be found in Results Review.    ASSESSMENT: Jordan [name] is a 57 y.o. female, 1 Day Post-Op s/p OPEN REDUCTION INTERNAL FIXATION RIGHT FOOT LISFRANC FRACTURE  CV/Blood loss: Hemoglobin 12.3 this morning, slight drop from preop but overall stable.  Hemodynamically stable  PLAN:  Weightbearing: NWB RLE ROM: Okay for knee motion.  Maintain splint RLE Incisional and dressing care: Dressings left intact until follow-up  Showering: Okay to shower, keep splint dry Orthopedic device(s): Splint RLE Pain management:  1. Tylenol 650 mg q 6 hours scheduled 2. Robaxin 500 mg q 6 hours PRN 3. Oxycodone 15 mg q 4 hours PRN 4. Dilaudid 1 mg q 3 hours PRN 5. Toradol 15 mg q 6 hours x 3 days 6. Neurontin 800 mg TID PRN VTE prophylaxis: Lovenox, SCDs ID:  Ancef 2gm post op Foley/Lines:  No foley, KVO IVFs Impediments to Fracture Healing: Vitamin D level pending, will start supplementation as indicated. Dispo: PT/OT evaluation today, dispo pending.  Patient may require SNF.  Okay for discharge from ortho standpoint once cleared by medicine team and therapies.  D/C recommendations: - Oxycodone 15 mg q 6 hours PRN, Robaxin for pain control - Eliquis 2.5 mg twice daily x 30 days for DVT prophylaxis - Possible need for Vit D supplementation  Follow - up plan: 2 weeks after discharge for wound check, splint removal, repeat x-rays   Contact information:  Truitt Merle MD, Thyra Breed  PA-C. After hours and holidays please check [email or web address] for group call information for [facility]   Thompson Caul, PA-C [phone] (office) [email or web address]

## 2023-09-16 NOTE — Progress Notes (Signed)
Occupational Therapy Treatment Patient Details Name: Jordan [name] MRN: [id number] DOB: 1966-06-07 Today's Date: 09/16/2023   History of present illness Pt is a 57 y/o female presenting on 11/23 after fall.  Found with R 1st tarsometatarsal fx, R tibia refracture and R fibula fx.  11/25 S/P ORIF 1st tarsometatarsal fx and closed treatment of R tibia fx. PMH includes: alcohol abuse, anemia, bipolar 1, CAP, sepsis, UTI, obesity.   OT comments  PTA patient reports independent with ADLs and mobility, using [facility] or medicaid transportation as needed.  Reports intermittent assistance available from neighbors, but living alone.  Admitted for above and presents with problem list below. Today, she requires min assist for lateral scoot transfers, mod assist to stand with RW from significant elevated EOB; setup to max assist for ADLs.  Pt requires frequent redirection during session and constant cueing to maintain NWB to R LE.  Based on performance today, believe patient will best benefit from continued OT services acutely and after dc at inpatient setting with <3hrs/day to optimize independence, safety with ADLs and mobility.         If plan is discharge home, recommend the following:  Two people to help with walking and/or transfers;A lot of help with bathing/dressing/bathroom;Assistance with cooking/housework;Assist for transportation;Help with stairs or ramp for entrance   Equipment Recommendations  Other (comment) (defer)    Recommendations for Other Services PT consult    Precautions / Restrictions Precautions Precautions: Fall Restrictions Weight Bearing Restrictions: Yes RLE Weight Bearing: Non weight bearing       Mobility Bed Mobility Overal bed mobility: Needs Assistance Bed Mobility: Supine to Sit, Sit to Supine     Supine to sit: Supervision Sit to supine: Supervision   General bed mobility comments: for safety    Transfers Overall transfer level: Needs assistance Equipment  used: Rolling walker (2 wheels) Transfers: Sit to/from Stand, Bed to chair/wheelchair/BSC Sit to Stand: Mod assist, From elevated surface          Lateral/Scoot Transfers: Min assist General transfer comment: simulated lateral scoot at EOB with min assist, standing from signiticantly elevated EOB with mod assist and cueing as unble to maintain NWB to R LE     Balance Overall balance assessment: Needs assistance Sitting-balance support: No upper extremity supported, Feet supported Sitting balance-Leahy Scale: Fair     Standing balance support: Bilateral upper extremity supported, During functional activity Standing balance-Leahy Scale: Poor Standing balance comment: relies on BUE and external support, max cueing to keep R LE NWB                           ADL either performed or assessed with clinical judgement   ADL Overall ADL's : Needs assistance/impaired     Grooming: Supervision/safety;Sitting           Upper Body Dressing : Set up;Sitting   Lower Body Dressing: Maximal assistance;Sitting/lateral leans;Sit to/from Market researcher Details (indicate cue type and reason): simulated lateral scooting at EOB with min assist         Functional mobility during ADLs: Maximal assistance;Rolling walker (2 wheels);Cueing for safety;Cueing for sequencing      Extremity/Trunk Assessment Upper Extremity Assessment Upper Extremity Assessment: Overall WFL for tasks assessed   Lower Extremity Assessment Lower Extremity Assessment: Defer to PT evaluation        Vision   Vision Assessment?: No apparent visual deficits   Perception     Praxis  Cognition Arousal: Alert Behavior During Therapy: Impulsive, Anxious Overall Cognitive Status: No family/caregiver present to determine baseline cognitive functioning Area of Impairment: Attention, Memory, Following commands, Awareness, Safety/judgement, Problem solving                   Current  Attention Level: Focused Memory: Decreased recall of precautions, Decreased short-term memory Following Commands: Follows one step commands consistently, Follows one step commands with increased time, Follows multi-step commands inconsistently Safety/Judgement: Decreased awareness of safety, Decreased awareness of deficits Awareness: Emergent Problem Solving: Slow processing, Decreased initiation, Difficulty sequencing, Requires verbal cues, Requires tactile cues General Comments: patient oriented and following simple commands, but requires frequent redirection to task, cueing for safety and demonstrates difficulty following mulitple step commands. Requires repetition throughout session. Unsure if this is baseline.        Exercises      Shoulder Instructions       General Comments pt reports having been to [facility] before, requesting OT to reach out to see if she can return there- reacher out to coordinators per pt request    Pertinent Vitals/ Pain       Pain Assessment Pain Assessment: No/denies pain  Home Living Family/patient expects to be discharged to:: Private residence Living Arrangements: Alone Available Help at Discharge: Neighbor;Available PRN/intermittently Type of Home: Apartment Home Access: Level entry     Home Layout: One level     Bathroom Shower/Tub: Producer, television/film/video: Standard Bathroom Accessibility: Yes How Accessible: Accessible via wheelchair Home Equipment: Shower seat;BSC/3in1;Rollator (4 wheels)          Prior Functioning/Environment              Frequency  Min 1X/week        Progress Toward Goals  OT Goals(current goals can now be found in the care plan section)     Acute Rehab OT Goals Patient Stated Goal: get better OT Goal Formulation: With patient Time For Goal Achievement: 09/30/23 Potential to Achieve Goals: Good  Plan      Co-evaluation                 AM-PAC OT "6 Clicks" Daily Activity      Outcome Measure   Help from another person eating meals?: None Help from another person taking care of personal grooming?: A Little Help from another person toileting, which includes using toliet, bedpan, or urinal?: A Lot Help from another person bathing (including washing, rinsing, drying)?: A Lot Help from another person to put on and taking off regular upper body clothing?: A Little Help from another person to put on and taking off regular lower body clothing?: Total 6 Click Score: 15    End of Session Equipment Utilized During Treatment: Gait belt;Rolling walker (2 wheels)  OT Visit Diagnosis: Other abnormalities of gait and mobility (R26.89);Muscle weakness (generalized) (M62.81);History of falling (Z91.81)   Activity Tolerance Patient tolerated treatment well   Patient Left in bed;with call bell/phone within reach;with bed alarm set   Nurse Communication Mobility status;Need for lift equipment        Time: 0920-1000 OT Time Calculation (min): 40 min  Charges: OT General Charges $OT Visit: 1 Visit OT Evaluation $OT Eval Moderate Complexity: 1 Mod OT Treatments $Self Care/Home Management : 23-37 mins  Barry Brunner, OT Acute Rehabilitation Services Office [phone]   Chancy Milroy 09/16/2023, 12:28 PM

## 2023-09-17 ENCOUNTER — Encounter: Payer: Self-pay | Admitting: General Practice

## 2023-09-17 DIAGNOSIS — S82401A Unspecified fracture of shaft of right fibula, initial encounter for closed fracture: Secondary | ICD-10-CM | POA: Diagnosis not present

## 2023-09-17 DIAGNOSIS — S82201A Unspecified fracture of shaft of right tibia, initial encounter for closed fracture: Secondary | ICD-10-CM | POA: Diagnosis not present

## 2023-09-17 LAB — CBC
HCT: 32.9 % — ABNORMAL LOW (ref 36.0–46.0)
Hemoglobin: 11 g/dL — ABNORMAL LOW (ref 12.0–15.0)
MCH: 30.8 pg (ref 26.0–34.0)
MCHC: 33.4 g/dL (ref 30.0–36.0)
MCV: 92.2 fL (ref 80.0–100.0)
Platelets: 148 K/uL — ABNORMAL LOW (ref 150–400)
RBC: 3.57 MIL/uL — ABNORMAL LOW (ref 3.87–5.11)
RDW: 13 % (ref 11.5–15.5)
WBC: 4.5 K/uL (ref 4.0–10.5)
nRBC: 0 % (ref 0.0–0.2)

## 2023-09-17 MED ORDER — VITAMIN D3 25 MCG PO TABS: 1000.0000 [IU] | ORAL_TABLET | Freq: Every day | ORAL | Status: AC

## 2023-09-17 MED ORDER — ONDANSETRON HCL 4 MG PO TABS: 4.0000 mg | ORAL_TABLET | Freq: Four times a day (QID) | ORAL | Status: DC | PRN

## 2023-09-17 MED ORDER — ACETAMINOPHEN 325 MG PO TABS: 650.0000 mg | ORAL_TABLET | Freq: Four times a day (QID) | ORAL | Status: AC | PRN

## 2023-09-17 MED ORDER — ACETAMINOPHEN 325 MG PO TABS
650.0000 mg | ORAL_TABLET | Freq: Four times a day (QID) | ORAL | Status: DC | PRN
  Administered 2023-09-22 – 2023-09-27 (×2): 650 mg via ORAL
  Filled 2023-09-17 (×3): qty 2

## 2023-09-17 MED ORDER — METHOCARBAMOL 500 MG PO TABS: 500.0000 mg | ORAL_TABLET | Freq: Four times a day (QID) | ORAL | 0 refills | Status: DC | PRN

## 2023-09-17 MED ORDER — OXYCODONE HCL 15 MG PO TABS: 15.0000 mg | ORAL_TABLET | ORAL | 0 refills | Status: DC | PRN

## 2023-09-17 MED ORDER — APIXABAN 2.5 MG PO TABS: 2.5000 mg | ORAL_TABLET | Freq: Two times a day (BID) | ORAL | 0 refills | Status: DC

## 2023-09-17 MED ORDER — VITAMIN D 25 MCG (1000 UNIT) PO TABS
1000.0000 [IU] | ORAL_TABLET | Freq: Every day | ORAL | Status: DC
  Administered 2023-09-17 – 2023-09-29 (×13): 1000 [IU] via ORAL
  Filled 2023-09-17 (×13): qty 1

## 2023-09-17 NOTE — Progress Notes (Signed)
Physical Therapy Treatment Patient Details Name: Jordan [name] MRN: [id number] DOB: 1966-05-24 Today's Date: 09/17/2023   History of Present Illness Pt is a 57 y/o female presenting on 11/23 after fall.  Found with R 1st tarsometatarsal fx, R tibia refracture and R fibula fx.  11/25 S/P ORIF 1st tarsometatarsal fx and closed treatment of R tibia fx. PMH includes: alcohol abuse, anemia, bipolar 1, CAP, sepsis, UTI, obesity.    PT Comments  Pt in bed upon arrival and agreeable to PT session. Worked on transfers and LE strength in today's session. Pt was able to stand with MinAx2 and RW with less assistance needed on the second stand. Pt continues to have difficulty adhering to R LE NWB precautions with physical assistance needed to keep R LE in extension while standing. Pt was unable to maintain standing for >20 seconds due to fatigue and pain. Pt performed lateral scoot to recliner with MinAx2 to assist with holding R LE and for safety. Pt is progressing slowly towards goals. Acute PT to follow.      If plan is discharge home, recommend the following: Two people to help with walking and/or transfers;A lot of help with bathing/dressing/bathroom;Assistance with cooking/housework;Direct supervision/assist for medications management;Assist for transportation;Help with stairs or ramp for entrance;Supervision due to cognitive status   Can travel by private vehicle     No  Equipment Recommendations  Other (comment) (Per accepting facility)    Recommendations for Other Services Rehab consult (Per patient request)     Precautions / Restrictions Precautions Precautions: Fall Restrictions Weight Bearing Restrictions: Yes RLE Weight Bearing: Non weight bearing     Mobility  Bed Mobility Overal bed mobility: Needs Assistance Bed Mobility: Supine to Sit     Supine to sit: Supervision     General bed mobility comments: for safety, increased time and effort    Transfers Overall transfer  level: Needs assistance Equipment used: Rolling walker (2 wheels) Transfers: Sit to/from Stand, Bed to chair/wheelchair/BSC Sit to Stand: From elevated surface, +2 physical assistance, Min assist          Lateral/Scoot Transfers: Min assist, +2 safety/equipment General transfer comment: MinAx2 from elevated surface for boost up. Pt required multiple attempts and preferred to push with B UE on RW despite cues. Assist to keep R LE from bearing weight while standing. Pt able to stand with CGA, however, unable to hop or maintain standing for >20 seconds. Performed lateral scoot to recliner with MinA to keep R LE from WB while scooting.    Ambulation/Gait  General Gait Details: unable         Balance Overall balance assessment: Needs assistance Sitting-balance support: No upper extremity supported, Feet supported Sitting balance-Leahy Scale: Fair     Standing balance support: Bilateral upper extremity supported, During functional activity Standing balance-Leahy Scale: Poor Standing balance comment: relies on BUE and external support, max cueing to keep R LE NWB       Cognition Arousal: Alert Behavior During Therapy: Impulsive, Anxious Overall Cognitive Status: No family/caregiver present to determine baseline cognitive functioning Area of Impairment: Memory, Following commands, Safety/judgement, Problem solving    Memory: Decreased recall of precautions, Decreased short-term memory Following Commands: Follows one step commands consistently, Follows one step commands with increased time, Follows multi-step commands inconsistently Safety/Judgement: Decreased awareness of safety, Decreased awareness of deficits   Problem Solving: Slow processing, Decreased initiation, Difficulty sequencing, Requires verbal cues, Requires tactile cues General Comments: cueing for safety and demonstrates difficulty following multiple step commands. Was  able to remember precautions, however, had  difficulty following with mobility requiring cues        Exercises Other Exercises Other Exercises: 2 serial STS w/ RW and MinAx2    General Comments General comments (skin integrity, edema, etc.): VSS on RA, R LE splint      Pertinent Vitals/Pain Pain Assessment Pain Assessment: Faces Faces Pain Scale: Hurts little more Pain Location: R LE Pain Descriptors / Indicators: Aching, Discomfort Pain Intervention(s): Limited activity within patient's tolerance, Monitored during session, Repositioned     PT Goals (current goals can now be found in the care plan section) Acute Rehab PT Goals Patient Stated Goal: to go home PT Goal Formulation: With patient Time For Goal Achievement: 09/30/23 Potential to Achieve Goals: Fair Progress towards PT goals: Progressing toward goals    Frequency    Min 1X/week       AM-PAC PT "6 Clicks" Mobility   Outcome Measure  Help needed turning from your back to your side while in a flat bed without using bedrails?: A Little Help needed moving from lying on your back to sitting on the side of a flat bed without using bedrails?: A Little Help needed moving to and from a bed to a chair (including a wheelchair)?: A Lot Help needed standing up from a chair using your arms (e.g., wheelchair or bedside chair)?: A Lot Help needed to walk in hospital room?: Total Help needed climbing 3-5 steps with a railing? : Total 6 Click Score: 12    End of Session Equipment Utilized During Treatment: Gait belt Activity Tolerance: Patient tolerated treatment well Patient left: in chair;with call bell/phone within reach;with chair alarm set Nurse Communication: Mobility status PT Visit Diagnosis: Other abnormalities of gait and mobility (R26.89)     Time: 1133-1203 PT Time Calculation (min) (ACUTE ONLY): 30 min  Charges:    $Therapeutic Exercise: 8-22 mins $Therapeutic Activity: 8-22 mins PT General Charges $$ ACUTE PT VISIT: 1 Visit                     Hilton Cork, PT, DPT Secure Chat Preferred  Rehab Office [phone]   Arturo Morton Brion Aliment 09/17/2023, 12:42 PM

## 2023-09-17 NOTE — TOC Initial Note (Signed)
Transition of Care [facility]) - Initial/Assessment Note    Patient Details  Name: Jordan [name] MRN: [id number] Date of Birth: [birth year]  Transition of Care [facility]) CM/SW Contact:    Lorri Frederick, LCSW Phone Number: 09/17/2023, 3:20 PM  Clinical Narrative:    CSW met with pt regarding PT recommendations for SNF.  Pt asking about [facility], states she was there one time before and this would be first choice.  She would consider SNF if cannot go to [facility].  Pt from home alone, no current services.  Pt reports she has been sober from alcohol and all drugs for "years", she had difficulty estimating but endorsed no use in past 5 years.    CSW reached out to [facility].  Will need PASSR if SNF.               Expected Discharge Plan: Skilled Nursing Facility Barriers to Discharge: Continued Medical Work up, SNF Pending bed offer   Patient Goals and CMS Choice Patient states their goals for this hospitalization and ongoing recovery are:: walking          Expected Discharge Plan and Services In-house Referral: Clinical Social Work   Post Acute Care Choice: IP Rehab Living arrangements for the past 2 months: Single Family Home                                      Prior Living Arrangements/Services Living arrangements for the past 2 months: Single Family Home Lives with:: Self Patient language and need for interpreter reviewed:: Yes Do you feel safe going back to the place where you live?: Yes      Need for Family Participation in Patient Care: No (Comment) Care giver support system in place?: Yes (comment) Current home services: Other (comment) (none) Criminal Activity/Legal Involvement Pertinent to Current Situation/Hospitalization: No - Comment as needed  Activities of Daily Living   ADL Screening (condition at time of admission) Independently performs ADLs?: Yes (appropriate for developmental age) Is the patient deaf or have difficulty hearing?: No Does the patient have  difficulty seeing, even when wearing glasses/contacts?: No Does the patient have difficulty concentrating, remembering, or making decisions?: No  Permission Sought/Granted Permission sought to share information with : Family Supports Permission granted to share information with : Yes, Verbal Permission Granted  Share Information with NAME: daughter [place], friend Firefighter granted to share info w AGENCY: SNF        Emotional Assessment Appearance:: Appears stated age Attitude/Demeanor/Rapport: Engaged Affect (typically observed): Appropriate, Pleasant Orientation: : Oriented to Self, Oriented to Place, Oriented to  Time, Oriented to Situation      Admission diagnosis:  Closed fracture of right tibia and fibula [S82.201A, S82.401A] Patient Active Problem List   Diagnosis Date Noted   Closed fracture of right tibia and fibula 09/13/2023   Closed displaced fracture of first metatarsal bone of right foot 09/13/2023   Thoracic radiculitis 02/10/2023   Schizophrenia (HCC) 02/10/2023   Pain in thoracic spine 02/10/2023   Moderate intellectual disability 02/10/2023   Lumbar radiculopathy 02/10/2023   Chronic pain 02/10/2023   Cystitis 01/16/2023   Dysuria 12/12/2022   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   Vitamin B12 deficiency 08/19/2022   Complicated UTI (urinary tract infection) 08/17/2022   Grief 04/17/2022   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   Menopausal symptoms 05/02/2021   Urge incontinence of urine 12/26/2020   Leukopenia    Vitamin  D deficiency    Acute lower UTI    Morbid obesity with BMI of 40.0-44.9, adult (HCC) 06/11/2019   Vaginal cancer (HCC) 05/28/2019   Visit for routine gyn exam 04/21/2019   Vaginal atrophy 04/21/2019   Urinary tract infection with hematuria 07/01/2015   Diastolic dysfunction 07/01/2015   Constipation 05/12/2015   ESBL (extended spectrum beta-lactamase) producing bacteria infection 03/17/2015   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 [place] [address] Phone: [phone] Fax: [phone]  [place] [facility] [address] [place] [address] Phone: [phone] Fax: [phone]     Social Determinants of Health (SDOH) Social History: SDOH Screenings   Food Insecurity: No Food Insecurity (09/14/2023)  Recent Concern: Food Insecurity - Food Insecurity Present (07/16/2023)  Housing: Low Risk  (09/14/2023)  Transportation Needs: No Transportation Needs (09/14/2023)  Recent Concern: Transportation Needs - Unmet Transportation Needs (07/16/2023)  Utilities: Not At Risk (09/14/2023)  Depression (PHQ2-9): Low Risk  (07/16/2023)  Tobacco Use: Low Risk  (09/15/2023)   SDOH Interventions:     Readmission Risk Interventions    08/22/2022    3:08 PM 08/19/2022   11:26 AM  Readmission Risk Prevention Plan  Post Dischage Appt  Complete  Medication Screening  Complete  Transportation Screening Complete Complete  PCP or Specialist Appt within 5-7 Days Complete   Home Care Screening Complete   Medication Review (RN CM) Complete

## 2023-09-17 NOTE — Progress Notes (Signed)
Inpatient Rehab Admissions Coordinator:   Per TOC/pt request pt was screened for [facility] by Estill Dooms, PT, DPT.  Also discussed with therapy yesterday.  Pt admitted with R ankle fx s/p ORIF.  She has a history of the same, for which she received rehab services in 2021.  Pt discharged to SNF from [facility] due to lack of support to facilitate safe d/c home with intermittent supervision, per review of the documentation.  Pt's current insurance would make it impossible for [name] to successfully seek SNF placement for her after CIR.  We cannot offer her a bed.    Estill Dooms, PT, DPT Admissions Coordinator [phone] 09/17/23  3:35 PM

## 2023-09-17 NOTE — [place] FL2 (Signed)
[place] MEDICAID FL2 LEVEL OF CARE FORM     IDENTIFICATION  Patient Name: Jordan [name] Birthdate: April 19, 1966 Sex: female Admission Date (Current Location): 09/13/2023  [place] and [place] Number:  [name] [id number] P Facility and Address:  [facility], [address] [place], [place] [address]      Provider Number: [id number]  Attending Physician Name and Address:  [name], DO  Relative Name and Phone Number:  Cori Razor   [phone]    Current Level of Care: Hospital Recommended Level of Care: Skilled Nursing Facility Prior Approval Number:    Date Approved/Denied:   PASRR Number:    Discharge Plan: SNF    Current Diagnoses: Patient Active Problem List   Diagnosis Date Noted   Closed fracture of right tibia and fibula 09/13/2023   Closed displaced fracture of first metatarsal bone of right foot 09/13/2023   Thoracic radiculitis 02/10/2023   Schizophrenia (HCC) 02/10/2023   Pain in thoracic spine 02/10/2023   Moderate intellectual disability 02/10/2023   Lumbar radiculopathy 02/10/2023   Chronic pain 02/10/2023   Cystitis 01/16/2023   Dysuria 12/12/2022   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   Vitamin B12 deficiency 08/19/2022   Complicated UTI (urinary tract infection) 08/17/2022   Grief 04/17/2022   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   Menopausal symptoms 05/02/2021   Urge incontinence of urine 12/26/2020   Leukopenia    Vitamin D deficiency    Acute lower UTI    Morbid obesity with BMI of 40.0-44.9, adult (HCC) 06/11/2019   Vaginal cancer (HCC) 05/28/2019   Visit for routine gyn exam 04/21/2019   Vaginal atrophy 04/21/2019   Urinary tract infection with hematuria 07/01/2015   Diastolic dysfunction 07/01/2015   Constipation 05/12/2015   ESBL (extended spectrum beta-lactamase) producing bacteria infection 03/17/2015   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   Anxiety state 02/20/2015   Claustrophobia 02/20/2015   Generalized  anxiety disorder 02/20/2015   Transaminitis 02/18/2015   Chest pain 02/18/2015   Pancytopenia (HCC)    Abdominal pain    Dizziness    Hypotension 01/04/2015   Bipolar disorder (HCC) 06/29/2012    Orientation RESPIRATION BLADDER Height & Weight     Self, Time, Situation, Place  Normal Continent Weight: 279 lb 15.8 oz (127 kg) Height:  5\' 8"  (172.7 cm)  BEHAVIORAL SYMPTOMS/MOOD NEUROLOGICAL BOWEL NUTRITION STATUS      Continent Diet (see discharge summary)  AMBULATORY STATUS COMMUNICATION OF NEEDS Skin   Total Care Verbally Surgical wounds                       Personal Care Assistance Level of Assistance  Bathing, Feeding, Dressing Bathing Assistance: Limited assistance Feeding assistance: Independent Dressing Assistance: Limited assistance     Functional Limitations Info  Sight, Hearing, Speech Sight Info: Adequate Hearing Info: Adequate Speech Info: Adequate    SPECIAL CARE FACTORS FREQUENCY  PT (By licensed PT), OT (By licensed OT)     PT Frequency: 5x week OT Frequency: 5x week            Contractures Contractures Info: Not present    Additional Factors Info  Code Status, Allergies Code Status Info: full Allergies Info: Ceftriaxone, Ciprofloxacin, Citalopram, Lamotrigine, Sulfa Antibiotics, Tramadol           Current Medications (09/17/2023):  This is the current hospital active medication list Current Facility-Administered Medications  Medication Dose Route Frequency Provider Last Rate Last Admin   acetaminophen (TYLENOL) tablet 650 mg  650 mg  Oral Q6H PRN West Bali, PA-C       cholecalciferol (VITAMIN D3) 25 MCG (1000 UNIT) tablet 1,000 Units  1,000 Units Oral Daily West Bali, PA-C   1,000 Units at 09/17/23 1149   divalproex (DEPAKOTE ER) 24 hr tablet 250 mg  250 mg Oral QHS West Bali, PA-C   250 mg at 09/16/23 2050   docusate sodium (COLACE) capsule 100 mg  100 mg Oral BID West Bali, PA-C   100 mg at 09/16/23 2051    enoxaparin (LOVENOX) injection 40 mg  40 mg Subcutaneous Q24H West Bali, PA-C   40 mg at 09/17/23 8657   feeding supplement (ENSURE ENLIVE / ENSURE PLUS) liquid 237 mL  237 mL Oral BID BM West Bali, PA-C   237 mL at 09/16/23 8469   gabapentin (NEURONTIN) capsule 800 mg  800 mg Oral TID PRN West Bali, PA-C       methocarbamol (ROBAXIN) tablet 500 mg  500 mg Oral Q6H PRN West Bali, PA-C   500 mg at 09/16/23 2051   Or   methocarbamol (ROBAXIN) injection 500 mg  500 mg Intravenous Q6H PRN West Bali, PA-C       metoCLOPramide (REGLAN) tablet 5-10 mg  5-10 mg Oral Q8H PRN Sharon Seller, Sarah A, PA-C       Or   metoCLOPramide (REGLAN) injection 5-10 mg  5-10 mg Intravenous Q8H PRN Sharon Seller, Sarah A, PA-C       multivitamin with minerals tablet 1 tablet  1 tablet Oral Daily West Bali, PA-C   1 tablet at 09/17/23 0909   ondansetron (ZOFRAN) tablet 4 mg  4 mg Oral Q6H PRN West Bali, PA-C       Or   ondansetron (ZOFRAN) injection 4 mg  4 mg Intravenous Q6H PRN Sharon Seller, Sarah A, PA-C       oxyCODONE (Oxy IR/ROXICODONE) immediate release tablet 15 mg  15 mg Oral Q4H PRN West Bali, PA-C   15 mg at 09/17/23 1227   polyethylene glycol (MIRALAX / GLYCOLAX) packet 17 g  17 g Oral Daily PRN West Bali, PA-C       venlafaxine XR (EFFEXOR-XR) 24 hr capsule 75 mg  75 mg Oral Q breakfast West Bali, PA-C   75 mg at 09/16/23 2050     Discharge Medications: Please see discharge summary for a list of discharge medications.  Relevant Imaging Results:  Relevant Lab Results:   Additional Information SS#:[id number]  Lorri Frederick, LCSW

## 2023-09-17 NOTE — Plan of Care (Signed)
Problem: Nutrition: Goal: Adequate nutrition will be maintained Outcome: Progressing   Problem: Coping: Goal: Level of anxiety will decrease Outcome: Progressing   Problem: Pain Management: Goal: General experience of comfort will improve Outcome: Progressing

## 2023-09-17 NOTE — Progress Notes (Signed)
Mobility Specialist: Progress Note   09/17/23 1600  Mobility  Activity Transferred from chair to bed  Level of Assistance +2 (takes two people) (minA)  Assistive Device None  RLE Weight Bearing NWB  Activity Response Tolerated well  Mobility Referral Yes  $Mobility charge 1 Mobility  Mobility Specialist Start Time (ACUTE ONLY) 1414  Mobility Specialist Stop Time (ACUTE ONLY) 1429  Mobility Specialist Time Calculation (min) (ACUTE ONLY) 15 min    Pt was agreeable to mobility session - received in chair. Dropped the L arm of recliner and laterally slide leading with her L side, pt stated that was easier for her as she is L side dominant. Left in bed with all needs met, call bell in reach. Chair alarm on.   Maurene Capes Mobility Specialist Please contact via SecureChat or Rehab office at [phone]

## 2023-09-17 NOTE — Discharge Instructions (Addendum)
   Truitt Merle, MD Thyra Breed PA-C [facility] [address] [phone] (tel)   [phone] (fax)                                  POST-OPERATIVE INSTRUCTIONS     WEIGHT BEARING STATUS: non-weightbearing right lower extremity  RANGE OF MOTION/ACTIVITY:  Ok for knee motion. Do not remove splint from right lower leg  WOUND CARE Wash your incision daily with soap and water Change dressing daily to the foot  EXERCISES You may come out of your boot to start gentle ankle motion DO NOT PUT ANY WEIGHT ON YOUR OPERATIVE LEG Please use crutches or a walker to avoid weight bearing.   DVT/PE prophylaxis:  Eliqius 2.5 mg twice daily x 30 days  DIET: As you were eating previously.  Can use over the counter stool softeners and bowel preparations, such as Miralax, to help with bowel movements.  Narcotics can be constipating.  Be sure to drink plenty of fluids  POST-OP MEDICATIONS- Multimodal approach to pain control  In general your pain will be controlled with a combination of substances.  Prescriptions unless otherwise discussed are electronically sent to your pharmacy.  This is a carefully made plan we use to minimize narcotic use.     - Acetaminophen - Non-narcotic pain medicine taken on a scheduled basis   - Oxycodone - This is a strong narcotic, to be used only on an "as needed" basis for pain.  -  Eliquis - This medicine is used to minimize the risk of blood clots after surgery.             -          Zofran - take as needed for nausea   FOLLOW-UP If you develop a Fever (>101.5), Redness or Drainage from the surgical incision site, please call our office to arrange for an evaluation. Please call the office to schedule a follow-up appointment for your incision check if you do not already have one, 7-10 days post-operatively.   VISIT OUR WEBSITE FOR ADDITIONAL INFORMATION: [email or web address]   HELPFUL INFORMATION  You should wean off your narcotic  medicines as soon as you are able.  Most patients will be off or using minimal narcotics before their first postop appointment.   Do not drink alcoholic beverages or take illicit drugs when taking pain medications.  In most states it is against the law to drive while you are in a splint or sling.  And certainly against the law to drive while taking narcotics.  Pain medication may make you constipated.  Below are a few solutions to try in this order: Decrease the amount of pain medication if you aren't having pain. Drink lots of decaffeinated fluids. Drink prune juice and/or each dried prunes  If the first 3 don't work start with additional solutions Take Colace - an over-the-counter stool softener Take Senokot - an over-the-counter laxative Take Miralax - a stronger over-the-counter laxative

## 2023-09-17 NOTE — Progress Notes (Signed)
[facility]  PROGRESS NOTE  Jordan [name]:[id number] DOB: 06-19-1966 DOA: 09/13/2023 PCP: Courtney Paris, NP   Brief HPI:   57 y.o. female with medical history significant of BPD1, CPS, morbid obesity.   Pt in to ED after mechanical fall.  Injured R lower leg and foot in fall.  Inability to bear wt or ambulate following fall.  Pain severe.    Assessment/Plan:   Closed fracture of right tibia and fibula Displaced R first metatarsal fx. Non-displaced preiprosthetic R tibia fx, right fibula fracture -Orthopedics consulted -Underwent ORIF right first tarsometatarsal fracture/dislocation -Closed treatment of right tibia fracture -Continue pain management  -PT/OT- SNF   Chronic pain -Continue oxycodone as above   Morbid obesity  Estimated body mass index is 42.57 kg/m as calculated from the following:   Height as of this encounter: 5\' 8"  (1.727 m).   Weight as of this encounter: 127 kg.    Thrombocytopenia -trending up   Medications     cholecalciferol  1,000 Units Oral Daily   divalproex  250 mg Oral QHS   docusate sodium  100 mg Oral BID   enoxaparin (LOVENOX) injection  40 mg Subcutaneous Q24H   feeding supplement  237 mL Oral BID BM   multivitamin with minerals  1 tablet Oral Daily   venlafaxine XR  75 mg Oral Q breakfast     Data Reviewed:   CBG:  No results for input(s): "GLUCAP" in the last 168 hours.  SpO2: 99 % O2 Flow Rate (L/min): 0 L/min    Vitals:   09/16/23 1537 09/16/23 2022 09/17/23 0427 09/17/23 0713  BP: (!) 102/55 103/75 116/64 103/74  Pulse: 72 86 71 70  Resp: 18 18 18 18   Temp: 98.6 F (37 C) 98.2 F (36.8 C) 98.5 F (36.9 C) 97.8 F (36.6 C)  TempSrc: Oral Oral Oral Oral  SpO2: 96% 95% 99% 99%  Weight:      Height:          Data Reviewed:  Basic Metabolic Panel: Recent Labs  Lab 09/13/23 2128 09/15/23 1458 09/16/23 0613  NA 138  --  136  K 3.6  --  4.5  CL 107  --  104  CO2 23  --  24  GLUCOSE 108*  --   159*  BUN 8  --  17  CREATININE 0.71 0.67 0.89  CALCIUM 8.9  --  8.9    CBC: Recent Labs  Lab 09/13/23 2128 09/15/23 1458 09/16/23 0613 09/17/23 0613  WBC 11.3* 6.4 8.5 4.5  NEUTROABS 9.6*  --   --   --   HGB 13.7 13.0 12.3 11.0*  HCT 41.5 36.8 34.8* 32.9*  MCV 92.4 89.3 89.2 92.2  PLT 160 120* 138* 148*    LFT No results for input(s): "AST", "ALT", "ALKPHOS", "BILITOT", "PROT", "ALBUMIN" in the last 168 hours.   Antibiotics: Anti-infectives (From admission, onward)    Start     Dose/Rate Route Frequency Ordered Stop   09/15/23 1800  ceFAZolin (ANCEF) IVPB 2g/100 mL premix        2 g 200 mL/hr over 30 Minutes Intravenous Every 8 hours 09/15/23 1403 09/16/23 2054   09/15/23 1210  vancomycin (VANCOCIN) powder  Status:  Discontinued          As needed 09/15/23 1217 09/15/23 1223         Code Status: Full code  Family Communication: No family at bedside   CONSULTS orthopedics   Subjective   Agreeable to  SNF   Objective    Physical Examination:   General: Appearance:    Obese female in no acute distress     Lungs:      respirations unlabored  Heart:    Normal heart rate.    MS:   All extremities are intact.   Neurologic:   Awake, alert      Status is: Inpatient:          [name]   [facility] If 7PM-7AM, please contact night-coverage at [email or web address], Office  [phone]   09/17/2023, 11:38 AM  LOS: 4 days

## 2023-09-17 NOTE — Progress Notes (Signed)
[facility] Progress Note  SUBJECTIVE: Doing well this morning.  Pain controlled.  Regional nerve block has worn off. States therapies went decent yesterday but had a hard time keeping weight off her left leg  No chest pain. No SOB. No nausea/vomiting. No other complaints. Is amenable to a SNF  OBJECTIVE:  Vitals:   09/17/23 0427 09/17/23 0713  BP: 116/64 103/74  Pulse: 71 70  Resp: 18 18  Temp: 98.5 F (36.9 C) 97.8 F (36.6 C)  SpO2: 99% 99%    General: Sitting up in bed, no acute distress Respiratory: No increased work of breathing.  RLE: Well-padded, well-fitting splint in place, remains clean, dry, intact.  Nontender above splint.  Endorses sensation to light touch over the toes.  Painless knee motion.  Wiggles toes. Toes are warm well-perfused  IMAGING: Stable post op imaging.   LABS:  Results for orders placed or performed during the hospital encounter of 09/13/23 (from the past 24 hour(s))  CBC     Status: Abnormal   Collection Time: 09/17/23  6:13 AM  Result Value Ref Range   WBC 4.5 4.0 - 10.5 K/uL   RBC 3.57 (L) 3.87 - 5.11 MIL/uL   Hemoglobin 11.0 (L) 12.0 - 15.0 g/dL   HCT 35.5 (L) 73.2 - 20.2 %   MCV 92.2 80.0 - 100.0 fL   MCH 30.8 26.0 - 34.0 pg   MCHC 33.4 30.0 - 36.0 g/dL   RDW 54.2 70.6 - 23.7 %   Platelets 148 (L) 150 - 400 K/uL   nRBC 0.0 0.0 - 0.2 %   *Note: Due to a large number of results and/or encounters for the requested time period, some results have not been displayed. A complete set of results can be found in Results Review.    ASSESSMENT: Jordan [name] is a 57 y.o. female, 2 Days Post-Op s/p OPEN REDUCTION INTERNAL FIXATION RIGHT FOOT LISFRANC FRACTURE  CV/Blood loss: Hemoglobin 12.3 this morning, slight drop from preop but overall stable.  Hemodynamically stable  PLAN: Weightbearing: NWB RLE ROM: Okay for knee motion.  Maintain splint RLE Incisional and dressing care: Dressings left intact until follow-up  Showering: Okay to  shower, keep splint dry Orthopedic device(s): Splint RLE Pain management:  1. Tylenol 650 mg q 6 hours PRN 2. Robaxin 500 mg q 6 hours PRN 3. Oxycodone 15 mg q 4 hours PRN 4. Dilaudid 1 mg q 3 hours PRN 5. Neurontin 800 mg TID PRN VTE prophylaxis: Lovenox, SCDs ID:  Ancef 2gm post op completed Foley/Lines:  No foley, KVO IVFs Impediments to Fracture Healing: Vitamin D level low normal at 33. Start supplementation Dispo: PT/OT evaluation ongoing, recommending SNF.  Okay for discharge from ortho standpoint once cleared by medicine team and therapies.  D/C recommendations: - Oxycodone 15 mg q 4 hours PRN, Robaxin for pain control - Eliquis 2.5 mg twice daily x 30 days for DVT prophylaxis - Continue 1000 units Vit D supplementation daily x 30 days  Follow - up plan: 2 weeks after discharge for wound check, splint removal, repeat x-rays   Contact information:  Truitt Merle MD, Thyra Breed PA-C. After hours and holidays please check [email or web address] for group call information for [facility]   Thompson Caul, PA-C [phone] (office) [email or web address]

## 2023-09-17 NOTE — Progress Notes (Signed)
[facility] Spiritual Care Note  Received and returned voicemail from Jordan [name] to check in about current admission to [facility] for leg injury.  Also aware that loneliness and social isolation have felt particularly daunting to her as the holidays approach, especially as she continues to grieve the loss of her SO Jordan [name].  Jordan [name] reaches out regularly for support.   [address] [name], [place], [facility] Pager [phone] Voicemail [phone]

## 2023-09-18 DIAGNOSIS — S82201A Unspecified fracture of shaft of right tibia, initial encounter for closed fracture: Secondary | ICD-10-CM | POA: Diagnosis not present

## 2023-09-18 DIAGNOSIS — S82401A Unspecified fracture of shaft of right fibula, initial encounter for closed fracture: Secondary | ICD-10-CM | POA: Diagnosis not present

## 2023-09-18 NOTE — Progress Notes (Signed)
[facility]  PROGRESS NOTE  Jordan [name] NWG:[id number] DOB: [birth year] DOA: 09/13/2023 PCP: Courtney Paris, NP   Brief HPI:   57 y.o. female with medical history significant of BPD1, CPS, morbid obesity.   Pt in to ED after mechanical fall.  Injured R lower leg and foot in fall.  Inability to bear wt or ambulate following fall.  Pain severe. S/p surgery and now TOC is working on placement    Assessment/Plan:   Closed fracture of right tibia and fibula Displaced R first metatarsal fx. Non-displaced preiprosthetic R tibia fx, right fibula fracture -Orthopedics consulted -Underwent ORIF right first tarsometatarsal fracture/dislocation -Closed treatment of right tibia fracture -Continue pain management  -PT/OT- SNF- TOC consulted   Chronic pain -Continue oxycodone as above   Morbid obesity  Estimated body mass index is 42.57 kg/m as calculated from the following:   Height as of this encounter: 5\' 8"  (1.727 m).   Weight as of this encounter: 127 kg.    Thrombocytopenia -trending up   Medications     cholecalciferol  1,000 Units Oral Daily   divalproex  250 mg Oral QHS   docusate sodium  100 mg Oral BID   enoxaparin (LOVENOX) injection  40 mg Subcutaneous Q24H   feeding supplement  237 mL Oral BID BM   multivitamin with minerals  1 tablet Oral Daily   venlafaxine XR  75 mg Oral Q breakfast     Data Reviewed:   CBG:  No results for input(s): "GLUCAP" in the last 168 hours.  SpO2: 96 % O2 Flow Rate (L/min): 0 L/min    Vitals:   09/17/23 0427 09/17/23 0713 09/18/23 0451 09/18/23 0835  BP: 116/64 103/74 (!) 112/57 132/61  Pulse: 71 70 76 92  Resp: 18 18 18 18   Temp: 98.5 F (36.9 C) 97.8 F (36.6 C) 98.7 F (37.1 C) 98.6 F (37 C)  TempSrc: Oral Oral Oral Oral  SpO2: 99% 99% 95% 96%  Weight:      Height:          Data Reviewed:  Basic Metabolic Panel: Recent Labs  Lab 09/13/23 2128 09/15/23 1458 09/16/23 0613  NA 138  --  136  K 3.6   --  4.5  CL 107  --  104  CO2 23  --  24  GLUCOSE 108*  --  159*  BUN 8  --  17  CREATININE 0.71 0.67 0.89  CALCIUM 8.9  --  8.9    CBC: Recent Labs  Lab 09/13/23 2128 09/15/23 1458 09/16/23 0613 09/17/23 0613  WBC 11.3* 6.4 8.5 4.5  NEUTROABS 9.6*  --   --   --   HGB 13.7 13.0 12.3 11.0*  HCT 41.5 36.8 34.8* 32.9*  MCV 92.4 89.3 89.2 92.2  PLT 160 120* 138* 148*    LFT No results for input(s): "AST", "ALT", "ALKPHOS", "BILITOT", "PROT", "ALBUMIN" in the last 168 hours.   Antibiotics: Anti-infectives (From admission, onward)    Start     Dose/Rate Route Frequency Ordered Stop   09/15/23 1800  ceFAZolin (ANCEF) IVPB 2g/100 mL premix        2 g 200 mL/hr over 30 Minutes Intravenous Every 8 hours 09/15/23 1403 09/16/23 2054   09/15/23 1210  vancomycin (VANCOCIN) powder  Status:  Discontinued          As needed 09/15/23 1217 09/15/23 1223         Code Status: Full code  Family Communication: No family at bedside  CONSULTS orthopedics   Subjective   Agreeable to SNF   Objective    Physical Examination:   General: Appearance:    Obese female in no acute distress     Lungs:      respirations unlabored  Heart:    Normal heart rate.    MS:   All extremities are intact.   Neurologic:   Awake, alert      Status is: Inpatient:          [name]   [facility] If 7PM-7AM, please contact night-coverage at [email or web address], Office  [phone]   09/18/2023, 10:07 AM  LOS: 5 days

## 2023-09-19 DIAGNOSIS — S82201A Unspecified fracture of shaft of right tibia, initial encounter for closed fracture: Secondary | ICD-10-CM | POA: Diagnosis not present

## 2023-09-19 DIAGNOSIS — S82401A Unspecified fracture of shaft of right fibula, initial encounter for closed fracture: Secondary | ICD-10-CM | POA: Diagnosis not present

## 2023-09-19 LAB — CBC
HCT: 33.9 % — ABNORMAL LOW (ref 36.0–46.0)
Hemoglobin: 11.4 g/dL — ABNORMAL LOW (ref 12.0–15.0)
MCH: 31.1 pg (ref 26.0–34.0)
MCHC: 33.6 g/dL (ref 30.0–36.0)
MCV: 92.4 fL (ref 80.0–100.0)
Platelets: 149 10*3/uL — ABNORMAL LOW (ref 150–400)
RBC: 3.67 MIL/uL — ABNORMAL LOW (ref 3.87–5.11)
RDW: 13.1 % (ref 11.5–15.5)
WBC: 4.6 10*3/uL (ref 4.0–10.5)
nRBC: 0 % (ref 0.0–0.2)

## 2023-09-19 LAB — BASIC METABOLIC PANEL
Anion gap: 9 (ref 5–15)
BUN: 13 mg/dL (ref 6–20)
CO2: 25 mmol/L (ref 22–32)
Calcium: 8.4 mg/dL — ABNORMAL LOW (ref 8.9–10.3)
Chloride: 102 mmol/L (ref 98–111)
Creatinine, Ser: 0.6 mg/dL (ref 0.44–1.00)
GFR, Estimated: >60 mL/min (ref >60)
Glucose, Bld: 111 mg/dL — ABNORMAL HIGH (ref 70–99)
Potassium: 4.5 mmol/L (ref 3.5–5.1)
Sodium: 136 mmol/L (ref 135–145)

## 2023-09-19 NOTE — Plan of Care (Signed)
  Problem: Clinical Measurements: Goal: Will remain free from infection Outcome: Progressing Goal: Cardiovascular complication will be avoided Outcome: Progressing   Problem: Nutrition: Goal: Adequate nutrition will be maintained Outcome: Progressing   Problem: Elimination: Goal: Will not experience complications related to bowel motility Outcome: Progressing Goal: Will not experience complications related to urinary retention Outcome: Progressing   Problem: Pain Management: Goal: General experience of comfort will improve Outcome: Progressing

## 2023-09-19 NOTE — Progress Notes (Addendum)
Physical Therapy Treatment Patient Details Name: Jordan [name] MRN: [id number] DOB: Jan 19, 1966 Today's Date: 09/19/2023   History of Present Illness Pt is a 57 y/o female presenting on 11/23 after fall.  Found with R 1st tarsometatarsal fx, R tibia refracture and R fibula fx.  11/25 S/P ORIF 1st tarsometatarsal fx and closed treatment of R tibia fx. PMH includes: alcohol abuse, anemia, bipolar 1, CAP, sepsis, UTI, obesity.    PT Comments  Pt in bed upon arrival and agreeable to PT session. Worked on transfers, LE strength, and mobility while adhering to WB precautions in today's session. Pt requires multiple verbal and tactile cues to adhere to R LE NWB. Pt needs multiple attempts with rocks to build momentum in order to stand. When performing lateral scoots to the recliner, pt needs increased time and effort with MinAx2 to ensure pt is not utilizing R LE to push. Pt will likely be wheelchair level upon discharge home w/ lateral scoots or slide board transfer. Current d/c recs remain appropriate. Pt is progressing towards goals. Acute PT to follow.      If plan is discharge home, recommend the following: Two people to help with walking and/or transfers;A lot of help with bathing/dressing/bathroom;Assistance with cooking/housework;Direct supervision/assist for medications management;Assist for transportation;Help with stairs or ramp for entrance;Supervision due to cognitive status   Can travel by private vehicle     No  Equipment Recommendations  Other (comment) (Per accepting facility)    Recommendations for Other Services Rehab consult (Per patient request)     Precautions / Restrictions Precautions Precautions: Fall Restrictions Weight Bearing Restrictions: Yes RLE Weight Bearing: Non weight bearing     Mobility  Bed Mobility Overal bed mobility: Needs Assistance Bed Mobility: Supine to Sit     Supine to sit: Supervision     General bed mobility comments: for safety,  increased time and effort    Transfers Overall transfer level: Needs assistance Equipment used: Rolling walker (2 wheels) Transfers: Sit to/from Stand, Bed to chair/wheelchair/BSC Sit to Stand: From elevated surface, +2 physical assistance, Min assist          Lateral/Scoot Transfers: Min assist, +2 safety/equipment General transfer comment: MinAx2 from very elevated surface for boost up. Pt has difficulty adhering to WB precautions, requiring assistance to hold R LE off the ground. Performed x2 lateral scoots to the left with MinA to adhere to WB precautions    Ambulation/Gait  General Gait Details: unable    Balance Overall balance assessment: Needs assistance Sitting-balance support: No upper extremity supported, Feet supported Sitting balance-Leahy Scale: Fair     Standing balance support: Bilateral upper extremity supported, During functional activity Standing balance-Leahy Scale: Poor Standing balance comment: relies on BUE and external support, max cueing to keep R LE NWB       Cognition Arousal: Alert Behavior During Therapy: Impulsive, Anxious Overall Cognitive Status: No family/caregiver present to determine baseline cognitive functioning Area of Impairment: Memory, Following commands, Safety/judgement, Problem solving    Memory: Decreased recall of precautions, Decreased short-term memory Following Commands: Follows one step commands consistently, Follows one step commands with increased time, Follows multi-step commands inconsistently Safety/Judgement: Decreased awareness of safety, Decreased awareness of deficits   Problem Solving: Slow processing, Decreased initiation, Difficulty sequencing, Requires verbal cues, Requires tactile cues General Comments: cueing for safety and demonstrates difficulty following multiple step commands. Was able to remember precautions, however, had difficulty following with mobility requiring cues        Exercises General  Exercises -  Lower Extremity Long Arc Quad: AROM, Right, 10 reps, Seated Hip ABduction/ADduction: AROM, Right, 10 reps, Seated Other Exercises Other Exercises: R LE isometric hold in extension, x10 sec    General Comments General comments (skin integrity, edema, etc.): VSS on RA      Pertinent Vitals/Pain Pain Assessment Pain Assessment: Faces Faces Pain Scale: Hurts little more Pain Location: R LE (with movement) Pain Descriptors / Indicators: Aching, Discomfort Pain Intervention(s): Limited activity within patient's tolerance, Monitored during session, Repositioned, Premedicated before session     PT Goals (current goals can now be found in the care plan section) Acute Rehab PT Goals Patient Stated Goal: to go home PT Goal Formulation: With patient Time For Goal Achievement: 09/30/23 Potential to Achieve Goals: Fair Progress towards PT goals: Progressing toward goals    Frequency    Min 1X/week      PT Plan      Co-evaluation PT/OT/SLP Co-Evaluation/Treatment: Yes Reason for Co-Treatment: Necessary to address cognition/behavior during functional activity;For patient/therapist safety;To address functional/ADL transfers PT goals addressed during session: Mobility/safety with mobility;Balance;Proper use of DME OT goals addressed during session: ADL's and self-care      AM-PAC PT "6 Clicks" Mobility   Outcome Measure  Help needed turning from your back to your side while in a flat bed without using bedrails?: A Little Help needed moving from lying on your back to sitting on the side of a flat bed without using bedrails?: A Little Help needed moving to and from a bed to a chair (including a wheelchair)?: A Lot Help needed standing up from a chair using your arms (e.g., wheelchair or bedside chair)?: A Lot Help needed to walk in hospital room?: Total Help needed climbing 3-5 steps with a railing? : Total 6 Click Score: 12    End of Session Equipment Utilized During  Treatment: Gait belt Activity Tolerance: Patient tolerated treatment well Patient left: with call bell/phone within reach;in bed;with bed alarm set Nurse Communication: Mobility status PT Visit Diagnosis: Other abnormalities of gait and mobility (R26.89)     Time: 5366-4403 PT Time Calculation (min) (ACUTE ONLY): 36 min  Charges:    $Therapeutic Activity: 8-22 mins PT General Charges $$ ACUTE PT VISIT: 1 Visit                     Hilton Cork, PT, DPT Secure Chat Preferred  Rehab Office [phone]    Arturo Morton Brion Aliment 09/19/2023, 11:47 AM

## 2023-09-19 NOTE — Progress Notes (Addendum)
       RE:  Jordan [name]    Date of Birth:  02/12/1966   Date:   09/19/2023      To Whom It May Concern:  Please be advised that the above-named patient will require a short-term nursing home stay - anticipated 30 days or less for rehabilitation and strengthening.  The plan is for return home.

## 2023-09-19 NOTE — Progress Notes (Signed)
Occupational Therapy Treatment Patient Details Name: Jordan [name] MRN: [id number] DOB: 13-Jan-1966 Today's Date: 09/19/2023   History of present illness Pt is a 57 y/o female presenting on 11/23 after fall.  Found with R 1st tarsometatarsal fx, R tibia refracture and R fibula fx.  11/25 S/P ORIF 1st tarsometatarsal fx and closed treatment of R tibia fx. PMH includes: alcohol abuse, anemia, bipolar 1, CAP, sepsis, UTI, obesity.   OT comments  Patient slowly progressing towards OT goals.  She demonstrates poor adherence to NWB to R LE during ADLS and transfers without cueing.  She requires min assist +2 safety for lateral scoot transfers, min assist +2 physical assist to stand briefly.  She was educated on lateral leans for LB self care, requires mod assist for LB dressing and verbal cueing for safety/precaution adherence.  Continues to be limited by pain, difficulty following multiple step commands/sequencing, and NWB precautions to R LE.  Pt will likely be w/c level, and plan to focus on seated ADLs/ lateral scoot or slide board transfers in order to optimize independence and safety. Continue to recommend inpatient setting with <3hrs/day to optimize independence, safety with ADLs and mobility.        If plan is discharge home, recommend the following:  Two people to help with walking and/or transfers;A lot of help with bathing/dressing/bathroom;Assistance with cooking/housework;Assist for transportation;Help with stairs or ramp for entrance   Equipment Recommendations  Wheelchair (measurements OT);Wheelchair cushion (measurements OT);BSC/3in1;Hospital bed (drop arm BSC)    Recommendations for Other Services      Precautions / Restrictions Precautions Precautions: Fall Restrictions Weight Bearing Restrictions: Yes RLE Weight Bearing: Non weight bearing       Mobility Bed Mobility Overal bed mobility: Needs Assistance Bed Mobility: Supine to Sit     Supine to sit: Supervision      General bed mobility comments: for safety, increased time and effort    Transfers Overall transfer level: Needs assistance Equipment used: Rolling walker (2 wheels) Transfers: Sit to/from Stand, Bed to chair/wheelchair/BSC Sit to Stand: From elevated surface, +2 physical assistance, Min assist          Lateral/Scoot Transfers: Min assist, +2 safety/equipment General transfer comment: MinAx2 from very elevated surface for boost up. Pt has difficulty adhering to WB precautions, requiring assistance to hold R LE off the ground. Performed x2 lateral scoots to the left with MinA to adhere to WB precautions     Balance Overall balance assessment: Needs assistance Sitting-balance support: No upper extremity supported, Feet supported Sitting balance-Leahy Scale: Fair     Standing balance support: Bilateral upper extremity supported, During functional activity Standing balance-Leahy Scale: Poor Standing balance comment: relies on BUE and external support, max cueing to keep R LE NWB                           ADL either performed or assessed with clinical judgement   ADL Overall ADL's : Needs assistance/impaired     Grooming: Supervision/safety;Sitting           Upper Body Dressing : Set up;Sitting   Lower Body Dressing: Moderate assistance;Sitting/lateral leans Lower Body Dressing Details (indicate cue type and reason): simulated at EOB with lateral lean to place new pad under pt, increased assist due to body habitus; donning L sock in long sitting but requires cueing to not weightbear through R LE during task Toilet Transfer: Minimal assistance;+2 for safety/equipment Toilet Transfer Details (indicate cue type and reason):  simulated lateral scoot to drop arm recliner         Functional mobility during ADLs: Minimal assistance;+2 for safety/equipment      Extremity/Trunk Assessment              Vision       Perception     Praxis      Cognition  Arousal: Alert Behavior During Therapy: Impulsive, Anxious Overall Cognitive Status: No family/caregiver present to determine baseline cognitive functioning Area of Impairment: Memory, Following commands, Safety/judgement, Problem solving, Awareness, Attention                   Current Attention Level: Sustained Memory: Decreased recall of precautions, Decreased short-term memory Following Commands: Follows one step commands consistently, Follows one step commands with increased time, Follows multi-step commands inconsistently Safety/Judgement: Decreased awareness of safety, Decreased awareness of deficits Awareness: Emergent Problem Solving: Slow processing, Decreased initiation, Difficulty sequencing, Requires verbal cues, Requires tactile cues General Comments: cueing for safety and demonstrates difficulty following multiple step commands. Was able to remember precautions, however, had difficulty following with mobility requiring cues.  pt easily distracted, but able to attend when cued.  She is impulsive and requires cueing for safeyt        Exercises      Shoulder Instructions       General Comments VSS on RA    Pertinent Vitals/ Pain       Pain Assessment Pain Assessment: Faces Faces Pain Scale: Hurts little more Pain Location: R LE (with movement) Pain Descriptors / Indicators: Aching, Discomfort Pain Intervention(s): Limited activity within patient's tolerance, Monitored during session, Repositioned  Home Living                                          Prior Functioning/Environment              Frequency  Min 1X/week        Progress Toward Goals  OT Goals(current goals can now be found in the care plan section)  Progress towards OT goals: Progressing toward goals  Acute Rehab OT Goals Patient Stated Goal: get home after rehab OT Goal Formulation: With patient Time For Goal Achievement: 09/30/23 Potential to Achieve Goals: Good   Plan      Co-evaluation    PT/OT/SLP Co-Evaluation/Treatment: Yes Reason for Co-Treatment: Necessary to address cognition/behavior during functional activity;For patient/therapist safety;To address functional/ADL transfers PT goals addressed during session: Mobility/safety with mobility;Balance;Proper use of DME OT goals addressed during session: ADL's and self-care      AM-PAC OT "6 Clicks" Daily Activity     Outcome Measure   Help from another person eating meals?: None Help from another person taking care of personal grooming?: A Little Help from another person toileting, which includes using toliet, bedpan, or urinal?: A Lot Help from another person bathing (including washing, rinsing, drying)?: A Lot Help from another person to put on and taking off regular upper body clothing?: A Little Help from another person to put on and taking off regular lower body clothing?: A Lot 6 Click Score: 16    End of Session Equipment Utilized During Treatment: Gait belt;Rolling walker (2 wheels)  OT Visit Diagnosis: Other abnormalities of gait and mobility (R26.89);Muscle weakness (generalized) (M62.81);History of falling (Z91.81)   Activity Tolerance Patient tolerated treatment well   Patient Left in bed;with call bell/phone within reach;with bed  alarm set   Nurse Communication Mobility status        Time: [phone] OT Time Calculation (min): 36 min  Charges: OT General Charges $OT Visit: 1 Visit OT Treatments $Self Care/Home Management : 8-22 mins  Barry Brunner, OT Acute Rehabilitation Services Office [phone]   Chancy Milroy 09/19/2023, 11:19 AM

## 2023-09-19 NOTE — TOC Progression Note (Addendum)
Transition of Care [facility]) - Progression Note    Patient Details  Name: Jordan [name] MRN: [id number] Date of Birth: 01-14-1966  [facility] CM/SW Contact  Erin Sons, [place] Phone Number: 09/19/2023, 2:22 PM  Clinical Narrative:     SNF referrals have been faxed out. CSW met with pt and updated her that [facility] is unable to offer bed. CSW provided pt with SNF bed offers. [facility] would [facility] her first choice and [facility] would be her second choice. CSW answered pt's questions about SNF stay and insurance auth process.   CSW contacted [facility] and confirmed they can take pt pending SNF auth. They will run financials and initiate SNF auth request. TOC will continue to follow.   PASRR Pending(documents uploaded to [facility])  CSW updated pt on [facility] confirming bed and updated her landlord, Odella Aquas, who was bedside at pt's request.   Expected Discharge Plan: Skilled Nursing Facility Barriers to Discharge: Insurance Authorization  Expected Discharge Plan and Services In-house Referral: Clinical Social Work   Post Acute Care Choice: IP Rehab Living arrangements for the past 2 months: Single Family Home                                       Social Determinants of Health (SDOH) Interventions SDOH Screenings   Food Insecurity: No Food Insecurity (09/14/2023)  Recent Concern: Food Insecurity - Food Insecurity Present (07/16/2023)  Housing: Low Risk  (09/14/2023)  Transportation Needs: No Transportation Needs (09/14/2023)  Recent Concern: Transportation Needs - Unmet Transportation Needs (07/16/2023)  Utilities: Not At Risk (09/14/2023)  Depression (PHQ2-9): Low Risk  (07/16/2023)  Tobacco Use: Low Risk  (09/15/2023)    Readmission Risk Interventions    08/22/2022    3:08 PM 08/19/2022   11:26 AM  Readmission Risk Prevention Plan  Post Dischage Appt  Complete  Medication Screening  Complete  Transportation Screening Complete Complete   PCP or Specialist Appt within 5-7 Days Complete   Home Care Screening Complete   Medication Review (RN CM) Complete

## 2023-09-19 NOTE — Progress Notes (Signed)
[facility]  PROGRESS NOTE  Jordan [name] XLK:[id number] DOB: [birth year] DOA: 09/13/2023 PCP: Jordan Paris, NP   Brief HPI:   57 y.o. female with medical history significant of BPD1, CPS, morbid obesity.   Pt in to ED after mechanical fall.  Injured R lower leg and foot in fall.  Inability to bear wt or ambulate following fall.  Pain severe. S/p surgery and now TOC is working on placement    Assessment/Plan:   Closed fracture of right tibia and fibula Displaced R first metatarsal fx. Non-displaced preiprosthetic R tibia fx, right fibula fracture -Orthopedics consulted -Underwent ORIF right first tarsometatarsal fracture/dislocation -Closed treatment of right tibia fracture -Continue pain management  -PT/OT- SNF- TOC consulted   Chronic pain -Continue oxycodone as above   Morbid obesity  Estimated body mass index is 42.57 kg/m as calculated from the following:   Height as of this encounter: 5\' 8"  (1.727 m).   Weight as of this encounter: 127 kg.    Thrombocytopenia -trending up   Medications     cholecalciferol  1,000 Units Oral Daily   divalproex  250 mg Oral QHS   docusate sodium  100 mg Oral BID   enoxaparin (LOVENOX) injection  40 mg Subcutaneous Q24H   feeding supplement  237 mL Oral BID BM   multivitamin with minerals  1 tablet Oral Daily   venlafaxine XR  75 mg Oral Q breakfast     Data Reviewed:   CBG:  No results for input(s): "GLUCAP" in the last 168 hours.  SpO2: 95 % O2 Flow Rate (L/min): 0 L/min    Vitals:   09/18/23 1405 09/18/23 2114 09/19/23 0501 09/19/23 0735  BP: (!) 106/56 128/73 121/63 133/75  Pulse: 94 96 86 95  Resp: 18 19 18 18   Temp: 99 F (37.2 C) 98.5 F (36.9 C) 98.1 F (36.7 C) 98.6 F (37 C)  TempSrc: Oral Oral Oral Oral  SpO2: 94% 96% 95% 95%  Weight:      Height:          Data Reviewed:  Basic Metabolic Panel: Recent Labs  Lab 09/13/23 2128 09/15/23 1458 09/16/23 0613 09/19/23 0616  NA 138  --   136 136  K 3.6  --  4.5 4.5  CL 107  --  104 102  CO2 23  --  24 25  GLUCOSE 108*  --  159* 111*  BUN 8  --  17 13  CREATININE 0.71 0.67 0.89 0.60  CALCIUM 8.9  --  8.9 8.4*    CBC: Recent Labs  Lab 09/13/23 2128 09/15/23 1458 09/16/23 0613 09/17/23 0613 09/19/23 0616  WBC 11.3* 6.4 8.5 4.5 4.6  NEUTROABS 9.6*  --   --   --   --   HGB 13.7 13.0 12.3 11.0* 11.4*  HCT 41.5 36.8 34.8* 32.9* 33.9*  MCV 92.4 89.3 89.2 92.2 92.4  PLT 160 120* 138* 148* 149*    LFT No results for input(s): "AST", "ALT", "ALKPHOS", "BILITOT", "PROT", "ALBUMIN" in the last 168 hours.   Antibiotics: Anti-infectives (From admission, onward)    Start     Dose/Rate Route Frequency Ordered Stop   09/15/23 1800  ceFAZolin (ANCEF) IVPB 2g/100 mL premix        2 g 200 mL/hr over 30 Minutes Intravenous Every 8 hours 09/15/23 1403 09/16/23 2054   09/15/23 1210  vancomycin (VANCOCIN) powder  Status:  Discontinued          As needed 09/15/23 1217 09/15/23  1223         Code Status: Full code  Family Communication: No family at bedside   CONSULTS orthopedics   Subjective   Agreeable to SNF   Objective    Physical Examination:   In bed, NAD  Status is: Inpatient:          [name]   [facility] If 7PM-7AM, please contact night-coverage at [email or web address], Office  [phone]   09/19/2023, 12:57 PM  LOS: 6 days

## 2023-09-20 DIAGNOSIS — S82201A Unspecified fracture of shaft of right tibia, initial encounter for closed fracture: Secondary | ICD-10-CM | POA: Diagnosis not present

## 2023-09-20 DIAGNOSIS — S82401A Unspecified fracture of shaft of right fibula, initial encounter for closed fracture: Secondary | ICD-10-CM | POA: Diagnosis not present

## 2023-09-20 NOTE — Progress Notes (Signed)
[facility]  PROGRESS NOTE  Jordan [name]:[id number] DOB: February 06, 1966 DOA: 09/13/2023 PCP: Courtney Paris, NP   Brief HPI:   57 y.o. female with medical history significant of BPD1, CPS, morbid obesity.   Pt in to ED after mechanical fall.  Injured R lower leg and foot in fall.  Inability to bear wt or ambulate following fall.  Pain severe. S/p surgery and now TOC is working on placement    Assessment/Plan:   Closed fracture of right tibia and fibula Displaced R first metatarsal fx. Non-displaced preiprosthetic R tibia fx, right fibula fracture -Orthopedics consulted -Underwent ORIF right first tarsometatarsal fracture/dislocation -Closed treatment of right tibia fracture -Continue pain management  -PT/OT- SNF- TOC consulted   Chronic pain -Continue oxycodone as above   Morbid obesity  Estimated body mass index is 42.57 kg/m as calculated from the following:   Height as of this encounter: 5\' 8"  (1.727 m).   Weight as of this encounter: 127 kg.    Thrombocytopenia -trending up   Medications     cholecalciferol  1,000 Units Oral Daily   divalproex  250 mg Oral QHS   docusate sodium  100 mg Oral BID   enoxaparin (LOVENOX) injection  40 mg Subcutaneous Q24H   feeding supplement  237 mL Oral BID BM   multivitamin with minerals  1 tablet Oral Daily   venlafaxine XR  75 mg Oral Q breakfast     Data Reviewed:   CBG:  No results for input(s): "GLUCAP" in the last 168 hours.  SpO2: 95 % O2 Flow Rate (L/min): 0 L/min    Vitals:   09/19/23 1522 09/19/23 2000 09/20/23 0414 09/20/23 0912  BP: 134/66 (!) 144/78 118/72 127/63  Pulse: (!) 110 (!) 110 86 (!) 101  Resp: 18 19 16 15   Temp: 98.3 F (36.8 C) 98.5 F (36.9 C) 98.4 F (36.9 C) 97.8 F (36.6 C)  TempSrc: Oral Oral Oral Oral  SpO2: 95% 96% 96% 95%  Weight:      Height:          Data Reviewed:  Basic Metabolic Panel: Recent Labs  Lab 09/13/23 2128 09/15/23 1458 09/16/23 0613  09/19/23 0616  NA 138  --  136 136  K 3.6  --  4.5 4.5  CL 107  --  104 102  CO2 23  --  24 25  GLUCOSE 108*  --  159* 111*  BUN 8  --  17 13  CREATININE 0.71 0.67 0.89 0.60  CALCIUM 8.9  --  8.9 8.4*    CBC: Recent Labs  Lab 09/13/23 2128 09/15/23 1458 09/16/23 0613 09/17/23 0613 09/19/23 0616  WBC 11.3* 6.4 8.5 4.5 4.6  NEUTROABS 9.6*  --   --   --   --   HGB 13.7 13.0 12.3 11.0* 11.4*  HCT 41.5 36.8 34.8* 32.9* 33.9*  MCV 92.4 89.3 89.2 92.2 92.4  PLT 160 120* 138* 148* 149*      Code Status: Full code  Family Communication: No family at bedside   CONSULTS orthopedics   Subjective   Asking same questions daily   Objective    Physical Examination:   In bed, NAD- seems to have some short term memory issues  Status is: Inpatient:          [name]   [facility] If 7PM-7AM, please contact night-coverage at [email or web address], Office  [phone]   09/20/2023, 1:08 PM  LOS: 7 days

## 2023-09-20 NOTE — TOC Progression Note (Signed)
Transition of Care [facility]) - Progression Note    Patient Details  Name: Jordan [name] MRN: [id number] Date of Birth: January 13, 1966  [facility] CM/SW Contact  Donnalee Curry, LCSWA Phone Number: 09/20/2023, 10:54 AM  Clinical Narrative:     PASRR under manual review.  Expected Discharge Plan: Skilled Nursing Facility Barriers to Discharge: Insurance Authorization  Expected Discharge Plan and Services In-house Referral: Clinical Social Work   Post Acute Care Choice: IP Rehab Living arrangements for the past 2 months: Single Family Home                                       Social Determinants of Health (SDOH) Interventions SDOH Screenings   Food Insecurity: No Food Insecurity (09/14/2023)  Recent Concern: Food Insecurity - Food Insecurity Present (07/16/2023)  Housing: Low Risk  (09/14/2023)  Transportation Needs: No Transportation Needs (09/14/2023)  Recent Concern: Transportation Needs - Unmet Transportation Needs (07/16/2023)  Utilities: Not At Risk (09/14/2023)  Depression (PHQ2-9): Low Risk  (07/16/2023)  Tobacco Use: Low Risk  (09/15/2023)    Readmission Risk Interventions    08/22/2022    3:08 PM 08/19/2022   11:26 AM  Readmission Risk Prevention Plan  Post Dischage Appt  Complete  Medication Screening  Complete  Transportation Screening Complete Complete  PCP or Specialist Appt within 5-7 Days Complete   Home Care Screening Complete   Medication Review (RN CM) Complete

## 2023-09-21 DIAGNOSIS — S82201A Unspecified fracture of shaft of right tibia, initial encounter for closed fracture: Secondary | ICD-10-CM | POA: Diagnosis not present

## 2023-09-21 DIAGNOSIS — S82401A Unspecified fracture of shaft of right fibula, initial encounter for closed fracture: Secondary | ICD-10-CM | POA: Diagnosis not present

## 2023-09-21 NOTE — Progress Notes (Signed)
   09/21/23 1030  Mobility  Activity Dangled on edge of bed  Level of Assistance Standby assist, set-up cues, supervision of patient - no hands on  Assistive Device Other (Comment) (bedrails)  RLE Weight Bearing NWB  Activity Response Tolerated fair  Mobility Referral Yes  $Mobility charge 1 Mobility  Mobility Specialist Start Time (ACUTE ONLY) 0950  Mobility Specialist Stop Time (ACUTE ONLY) 1030  Mobility Specialist Time Calculation (min) (ACUTE ONLY) 40 min   Mobility Specialist: Progress Note  Pt agreeable to mobility session - received in bed. Required SB using bedrails. Lateral scoot x1 EOB following NWB precautions. No complaints throughout. Returned to bed with all needs met - call bell within reach. Further ambulation deferred d/t fatigue from scooting.   Barnie Mort, BS Mobility Specialist Please contact via SecureChat or Rehab office at [phone].

## 2023-09-21 NOTE — Plan of Care (Signed)

## 2023-09-21 NOTE — Progress Notes (Signed)
[facility]  PROGRESS NOTE  Zandrea Oles ZOX:[id number] DOB: 1966/08/02 DOA: 09/13/2023 PCP: Courtney Paris, NP   Brief HPI:   57 y.o. female with medical history significant of BPD1, CPS, morbid obesity.   Pt in to ED after mechanical fall.  Injured R lower leg and foot in fall.  Inability to bear wt or ambulate following fall.  Pain severe. S/p surgery and now TOC is working on placement    Assessment/Plan:   Closed fracture of right tibia and fibula Displaced R first metatarsal fx. Non-displaced preiprosthetic R tibia fx, right fibula fracture -Orthopedics consulted -Underwent ORIF right first tarsometatarsal fracture/dislocation -Closed treatment of right tibia fracture -Continue pain management  -PT/OT- SNF- TOC consulted   Chronic pain -Continue oxycodone as above   Morbid obesity  Estimated body mass index is 42.57 kg/m as calculated from the following:   Height as of this encounter: 5\' 8"  (1.727 m).   Weight as of this encounter: 127 kg.    Thrombocytopenia -trending up   Medications     cholecalciferol  1,000 Units Oral Daily   divalproex  250 mg Oral QHS   docusate sodium  100 mg Oral BID   enoxaparin (LOVENOX) injection  40 mg Subcutaneous Q24H   feeding supplement  237 mL Oral BID BM   multivitamin with minerals  1 tablet Oral Daily   venlafaxine XR  75 mg Oral Q breakfast     Data Reviewed:   CBG:  No results for input(s): "GLUCAP" in the last 168 hours.  SpO2: 95 % O2 Flow Rate (L/min): 0 L/min    Vitals:   09/20/23 1602 09/20/23 2025 09/21/23 0422 09/21/23 0831  BP: 125/65 118/77 136/62 (!) 102/56  Pulse: (!) 103 (!) 101 85 87  Resp:  20 20   Temp: 98.6 F (37 C) 98.4 F (36.9 C) 98.5 F (36.9 C) 97.9 F (36.6 C)  TempSrc: Oral Oral Oral Oral  SpO2: 95% 97% 97% 95%  Weight:      Height:          Data Reviewed:  Basic Metabolic Panel: Recent Labs  Lab 09/15/23 1458 09/16/23 0613 09/19/23 0616  NA  --  136 136  K   --  4.5 4.5  CL  --  104 102  CO2  --  24 25  GLUCOSE  --  159* 111*  BUN  --  17 13  CREATININE 0.67 0.89 0.60  CALCIUM  --  8.9 8.4*    CBC: Recent Labs  Lab 09/15/23 1458 09/16/23 0613 09/17/23 0613 09/19/23 0616  WBC 6.4 8.5 4.5 4.6  HGB 13.0 12.3 11.0* 11.4*  HCT 36.8 34.8* 32.9* 33.9*  MCV 89.3 89.2 92.2 92.4  PLT 120* 138* 148* 149*      Code Status: Full code  Family Communication: No family at bedside   CONSULTS orthopedics   Subjective   Plans to work with PT and get bath today   Objective    Physical Examination:   In bed, NAD  Status is: Inpatient:          [name]   [facility] If 7PM-7AM, please contact night-coverage at [email or web address], Office  [phone]   09/21/2023, 11:15 AM  LOS: 8 days

## 2023-09-22 DIAGNOSIS — S82401A Unspecified fracture of shaft of right fibula, initial encounter for closed fracture: Secondary | ICD-10-CM | POA: Diagnosis not present

## 2023-09-22 DIAGNOSIS — S82201A Unspecified fracture of shaft of right tibia, initial encounter for closed fracture: Secondary | ICD-10-CM | POA: Diagnosis not present

## 2023-09-22 LAB — CREATININE, SERUM
Creatinine, Ser: 0.61 mg/dL (ref 0.44–1.00)
GFR, Estimated: >60 mL/min (ref >60)

## 2023-09-22 NOTE — Progress Notes (Signed)
Physical Therapy Treatment Patient Details Name: Jordan [name] MRN: [id number] DOB: [birth year] Today's Date: 09/22/2023   History of Present Illness Pt is a 57 y/o female presenting on 11/23 after fall.  Found with R 1st tarsometatarsal fx, R tibia refracture and R fibula fx.  11/25 S/P ORIF 1st tarsometatarsal fx and closed treatment of R tibia fx. PMH includes: alcohol abuse, anemia, bipolar 1, CAP, sepsis, UTI, obesity.    PT Comments  Pt received in recliner, agreeable to therapy session and with good participation as able in transfer training. Pt reports severe pain in RLE, faces pain scale not matching numerical score, pt encouraged to ice+ elevated RLE to assist with pain management. Pt needing consistent minA for RLE assist to prevent accidental weight bearing during lateral scoot transfer from bed>chair and bed mobility. Pt with difficulty following verbal/visual cues for improved technique and tends to wiggle back into bed in supine posture, using LLE mostly and unable to follow cues for squat pivot to utilize BUE more with transfer. Secretary asked to order drop arm bari BSC for her room so she doesn't have to rely on bed pan. Not safe today to attempt hop pivot transfer as pt not following cues for anterior lean over BOS during transfer. Pt will continue to benefit from skilled rehab in a post acute setting <3 hours/day to maximize functional gains before returning home.     If plan is discharge home, recommend the following: Two people to help with walking and/or transfers;A lot of help with bathing/dressing/bathroom;Assistance with cooking/housework;Direct supervision/assist for medications management;Assist for transportation;Help with stairs or ramp for entrance;Supervision due to cognitive status   Can travel by private vehicle     No  Equipment Recommendations  Other (comment) (TBD post-acute)    Recommendations for Other Services       Precautions / Restrictions  Precautions Precautions: Fall Required Braces or Orthoses: Splint/Cast Splint/Cast: RLE short leg cast Splint/Cast - Date Prophylactic Dressing Applied (if applicable): 09/15/23 Restrictions Weight Bearing Restrictions: Yes RLE Weight Bearing: Non weight bearing     Mobility  Bed Mobility Overal bed mobility: Needs Assistance Bed Mobility: Sit to Supine       Sit to supine: Min assist, Used rails   General bed mobility comments: minA to prevent RLE WB while scooting in bed (PTA holding R limb elevated slightly throughout).    Transfers Overall transfer level: Needs assistance Equipment used: None Transfers: Bed to chair/wheelchair/BSC            Lateral/Scoot Transfers: Min assist, +2 safety/equipment General transfer comment: min assist to laterally scoot towards L side from chair>EOB.  Educated and demonstrated head/hip relationship as well as squat pivot to increase ease and improve safety but pt unable to follow commands to complete this way, she continue to place her L foot up on the bed multiple times to scoot towards recliner. PTA holding her RLE elevated to prevent pt accidentally weight bearing and pt often requesting to have RLE let down, but when RLE resting down, pt starts to try to use it for leverage  and WB through limb, so PTA has to hold RLE up at those times. Pt tending to extend hips and scoot in supine using L foot for leverage rather than squatting forward to allow for better anterior weight shift and UE weight bearing as cued. RN present for safety with standy-by but not needing to physically assist.    Ambulation/Gait  General Gait Details: unable   Stairs             Wheelchair Mobility     Tilt Bed    Modified Rankin (Stroke Patients Only)       Balance Overall balance assessment: Needs assistance Sitting-balance support: No upper extremity supported, Feet supported Sitting balance-Leahy Scale: Fair Sitting  balance - Comments: impulsive to return to supine while attempting to scoot but appears to be behavioral/poor technique and not due to trunk weakness/dizziness.       Standing balance comment: defer, pt having difficulty following commands for safe technique when scooting, would need +2-3 assist likely for safety with standing                            Cognition Arousal: Alert Behavior During Therapy: Impulsive, Anxious Overall Cognitive Status: No family/caregiver present to determine baseline cognitive functioning Area of Impairment: Memory, Following commands, Safety/judgement, Problem solving, Awareness, Attention                   Current Attention Level: Sustained Memory: Decreased short-term memory, Decreased recall of precautions Following Commands: Follows one step commands consistently, Follows one step commands with increased time, Follows multi-step commands inconsistently Safety/Judgement: Decreased awareness of deficits, Decreased awareness of safety Awareness: Emergent Problem Solving: Slow processing, Requires verbal cues, Requires tactile cues, Difficulty sequencing General Comments: pt continues to demonstrate difficulty with sequencing and following multiple step commands. Pt frequently apologizing for any requests made to PTA, pt given gentle encouragement and encouraged to take her time, but pt often states "I'm rushing, sorry" although encouraged not to rush.        Exercises General Exercises - Lower Extremity Long Arc Quad: AROM, 10 reps, Seated, Both Heel Slides: AROM, Both, 5 reps, Supine (ADduction pillow squeezes) Other Exercises Other Exercises: IS x 5 reps (pt achieves 1250 mL, encouraged hourly use)    General Comments General comments (skin integrity, edema, etc.): No acute s/sx distress other than c/o pain; pt tending to hold RLE flexed and hip in eversion at rest, pillow and sheet positioned to encourage elevated neutral R hiph  posture but pt unable to maintain this posture due to c/o RLE pain. ice placed over R ankle short leg cast.      Pertinent Vitals/Pain Pain Assessment Pain Assessment: 0-10 Pain Score: 10-Worst pain ever Faces Pain Scale: Hurts even more Breathing: normal Negative Vocalization: occasional moan/groan, low speech, negative/disapproving quality Facial Expression: sad, frightened, frown Body Language: tense, distressed pacing, fidgeting Consolability: distracted or reassured by voice/touch PAINAD Score: 4 Pain Location: R LE Pain Descriptors / Indicators: Aching, Discomfort, Grimacing, Moaning Pain Intervention(s): Limited activity within patient's tolerance, Monitored during session, Repositioned, RN gave pain meds during session, Ice applied    Home Living                          Prior Function            PT Goals (current goals can now be found in the care plan section) Acute Rehab PT Goals PT Goal Formulation: With patient Time For Goal Achievement: 09/30/23 Progress towards PT goals: Progressing toward goals    Frequency    Min 1X/week      PT Plan      Co-evaluation              AM-PAC PT "6 Clicks" Mobility   Outcome  Measure  Help needed turning from your back to your side while in a flat bed without using bedrails?: A Little Help needed moving from lying on your back to sitting on the side of a flat bed without using bedrails?: A Little Help needed moving to and from a bed to a chair (including a wheelchair)?: A Lot Help needed standing up from a chair using your arms (e.g., wheelchair or bedside chair)?: A Lot Help needed to walk in hospital room?: Total Help needed climbing 3-5 steps with a railing? : Total 6 Click Score: 12    End of Session Equipment Utilized During Treatment: Gait belt Activity Tolerance: Patient tolerated treatment well;Patient limited by pain Patient left: in bed;with bed alarm set;with call bell/phone within  reach;Other (comment) (RLE elevated, ice over R cast/ankle) Nurse Communication: Mobility status;Patient requests pain meds;Other (comment) (pt needs bari drop arm BSC, secretary already notified and will order) PT Visit Diagnosis: Other abnormalities of gait and mobility (R26.89)     Time: 1340-1406 PT Time Calculation (min) (ACUTE ONLY): 26 min  Charges:    $Therapeutic Exercise: 8-22 mins $Therapeutic Activity: 8-22 mins PT General Charges $$ ACUTE PT VISIT: 1 Visit                     Laneah Luft P., PTA Acute Rehabilitation Services Secure Chat Preferred 9a-5:30pm Office: [phone]    Dorathy Kinsman [facility] 09/22/2023, 2:28 PM

## 2023-09-22 NOTE — TOC Progression Note (Signed)
Transition of Care [facility]) - Progression Note    Patient Details  Name: Jordan [name] MRN: [id number] Date of Birth: 1966-03-17  [facility] CM/SW Contact  Lorri Frederick, LCSW Phone Number: 09/22/2023, 1:21 PM  Clinical Narrative:   Message sent to [facility] for update on insurance auth.  1320: PASSR received: [id number]    Expected Discharge Plan: Skilled Nursing Facility Barriers to Discharge: Insurance Authorization  Expected Discharge Plan and Services In-house Referral: Clinical Social Work   Post Acute Care Choice: IP Rehab Living arrangements for the past 2 months: Single Family Home                                       Social Determinants of Health (SDOH) Interventions SDOH Screenings   Food Insecurity: No Food Insecurity (09/14/2023)  Recent Concern: Food Insecurity - Food Insecurity Present (07/16/2023)  Housing: Low Risk  (09/14/2023)  Transportation Needs: No Transportation Needs (09/14/2023)  Recent Concern: Transportation Needs - Unmet Transportation Needs (07/16/2023)  Utilities: Not At Risk (09/14/2023)  Depression (PHQ2-9): Low Risk  (07/16/2023)  Tobacco Use: Low Risk  (09/15/2023)    Readmission Risk Interventions    08/22/2022    3:08 PM 08/19/2022   11:26 AM  Readmission Risk Prevention Plan  Post Dischage Appt  Complete  Medication Screening  Complete  Transportation Screening Complete Complete  PCP or Specialist Appt within 5-7 Days Complete   Home Care Screening Complete   Medication Review (RN CM) Complete

## 2023-09-22 NOTE — Progress Notes (Signed)
Occupational Therapy Treatment Patient Details Name: Jordan [name] MRN: [id number] DOB: 04-16-1966 Today's Date: 09/22/2023   History of present illness Pt is a 57 y/o female presenting on 11/23 after fall.  Found with R 1st tarsometatarsal fx, R tibia refracture and R fibula fx.  11/25 S/P ORIF 1st tarsometatarsal fx and closed treatment of R tibia fx. PMH includes: alcohol abuse, anemia, bipolar 1, CAP, sepsis, UTI, obesity.   OT comments  Patient completing lateral scoot transfers with min assist into recliner.  Poor safety and technique used will not be safe to w/c or drop arm BSC, educated and demonstrated safe technique to pt but pt with limited flexibility to change technique while completing transfer.  Maybe benefit from slide board training next session to optimize safety. She requires max assist for posterior hygiene from bed level due to body habitus.  Further AE training recommended as well.  Will follow acutely, and continue to recommend <3 hrs/day inpatient setting at dc.       If plan is discharge home, recommend the following:  A lot of help with bathing/dressing/bathroom;Assistance with cooking/housework;Assist for transportation;Help with stairs or ramp for entrance;A little help with walking and/or transfers   Equipment Recommendations  Wheelchair (measurements OT);Wheelchair cushion (measurements OT);BSC/3in1;Hospital bed (drop arm BSC)    Recommendations for Other Services      Precautions / Restrictions Precautions Precautions: Fall Restrictions Weight Bearing Restrictions: Yes RLE Weight Bearing: Non weight bearing       Mobility Bed Mobility Overal bed mobility: Needs Assistance Bed Mobility: Supine to Sit     Supine to sit: Supervision          Transfers Overall transfer level: Needs assistance   Transfers: Bed to chair/wheelchair/BSC            Lateral/Scoot Transfers: Min assist General transfer comment: min assist to laterally scoot towards  L side in to recliner.  Educated and demonstrated head/hip relationship as well as squat pivot to increase ease and improve safety but pt unable to follow commnads to complete this way, she continue to place her L foot up on the bed multiple times to scoot towards recliner.     Balance Overall balance assessment: Needs assistance Sitting-balance support: No upper extremity supported, Feet supported Sitting balance-Leahy Scale: Fair                                     ADL either performed or assessed with clinical judgement   ADL Overall ADL's : Needs assistance/impaired     Grooming: Supervision/safety;Sitting               Lower Body Dressing: Moderate assistance;Sitting/lateral leans   Toilet Transfer: Minimal assistance Toilet Transfer Details (indicate cue type and reason): lateral scoot simulated to recliner Toileting- Clothing Manipulation and Hygiene: Maximal assistance;Sitting/lateral lean Toileting - Clothing Manipulation Details (indicate cue type and reason): patient requires assist for hygiene from sidelying, pt unable to reach posterior hygiene from lateral leaning     Functional mobility during ADLs: Minimal assistance;Cueing for safety;Cueing for sequencing      Extremity/Trunk Assessment              Vision       Perception     Praxis      Cognition Arousal: Alert Behavior During Therapy: Impulsive, Anxious Overall Cognitive Status: No family/caregiver present to determine baseline cognitive functioning Area of Impairment: Memory, Following commands,  Safety/judgement, Problem solving, Awareness, Attention                   Current Attention Level: Sustained Memory: Decreased short-term memory, Decreased recall of precautions Following Commands: Follows one step commands consistently, Follows one step commands with increased time, Follows multi-step commands inconsistently Safety/Judgement: Decreased awareness of deficits,  Decreased awareness of safety Awareness: Emergent Problem Solving: Slow processing, Decreased initiation, Difficulty sequencing, Requires verbal cues, Requires tactile cues General Comments: pt continues to demonstrate difficulty with sequencing and following multiple step commands        Exercises      Shoulder Instructions       General Comments      Pertinent Vitals/ Pain       Pain Assessment Pain Assessment: Faces Faces Pain Scale: Hurts a little bit Pain Location: R LE Pain Descriptors / Indicators: Aching, Discomfort Pain Intervention(s): Limited activity within patient's tolerance, Monitored during session, Repositioned  Home Living                                          Prior Functioning/Environment              Frequency  Min 1X/week        Progress Toward Goals  OT Goals(current goals can now be found in the care plan section)  Progress towards OT goals: Progressing toward goals  Acute Rehab OT Goals Patient Stated Goal: home OT Goal Formulation: With patient Time For Goal Achievement: 09/30/23 Potential to Achieve Goals: Good  Plan      Co-evaluation                 AM-PAC OT "6 Clicks" Daily Activity     Outcome Measure   Help from another person eating meals?: None Help from another person taking care of personal grooming?: A Little Help from another person toileting, which includes using toliet, bedpan, or urinal?: A Lot Help from another person bathing (including washing, rinsing, drying)?: A Little Help from another person to put on and taking off regular upper body clothing?: A Little Help from another person to put on and taking off regular lower body clothing?: A Lot 6 Click Score: 17    End of Session    OT Visit Diagnosis: Other abnormalities of gait and mobility (R26.89);Muscle weakness (generalized) (M62.81);History of falling (Z91.81)   Activity Tolerance Patient tolerated treatment well    Patient Left in chair;with call bell/phone within reach;with chair alarm set   Nurse Communication Mobility status        Time: 2725-3664 OT Time Calculation (min): 25 min  Charges: OT General Charges $OT Visit: 1 Visit OT Treatments $Self Care/Home Management : 23-37 mins  Barry Brunner, OT Acute Rehabilitation Services Office [phone]   Chancy Milroy 09/22/2023, 1:59 PM

## 2023-09-22 NOTE — Plan of Care (Signed)

## 2023-09-22 NOTE — Progress Notes (Signed)
[facility]  PROGRESS NOTE  Wanell Newville MVH:[id number] DOB: 21-Oct-1966 DOA: 09/13/2023 PCP: Courtney Paris, NP   Brief HPI:   57 y.o. female with medical history significant of BPD1, CPS, morbid obesity.   Pt in to ED after mechanical fall.  Injured R lower leg and foot in fall.  Inability to bear wt or ambulate following fall.  Pain severe. S/p surgery and now TOC is working on placement.      Assessment/Plan:   Closed fracture of right tibia and fibula Displaced R first metatarsal fx. Non-displaced preiprosthetic R tibia fx, right fibula fracture -Orthopedics consulted -Underwent ORIF right first tarsometatarsal fracture/dislocation -Closed treatment of right tibia fracture -Continue pain management  -PT/OT- SNF- TOC consulted   Chronic pain -Continue oxycodone as above   Morbid obesity  Estimated body mass index is 42.57 kg/m as calculated from the following:   Height as of this encounter: 5\' 8"  (1.727 m).   Weight as of this encounter: 127 kg.    Thrombocytopenia -trending up   Medications     cholecalciferol  1,000 Units Oral Daily   divalproex  250 mg Oral QHS   docusate sodium  100 mg Oral BID   enoxaparin (LOVENOX) injection  40 mg Subcutaneous Q24H   feeding supplement  237 mL Oral BID BM   multivitamin with minerals  1 tablet Oral Daily   venlafaxine XR  75 mg Oral Q breakfast     Data Reviewed:   CBG:  No results for input(s): "GLUCAP" in the last 168 hours.  SpO2: 99 % O2 Flow Rate (L/min): 0 L/min    Vitals:   09/21/23 1449 09/21/23 2017 09/22/23 0520 09/22/23 0740  BP: 124/66 126/69 128/70 124/61  Pulse: (!) 105 95 96 91  Resp:  18 18 18   Temp: 98.5 F (36.9 C) 98.5 F (36.9 C) 98.5 F (36.9 C) 97.7 F (36.5 C)  TempSrc: Oral Oral Oral Oral  SpO2: 95% 95% 98% 99%  Weight:      Height:          Data Reviewed:  Basic Metabolic Panel: Recent Labs  Lab 09/15/23 1458 09/16/23 0613 09/19/23 0616 09/22/23 0556  NA  --   136 136  --   K  --  4.5 4.5  --   CL  --  104 102  --   CO2  --  24 25  --   GLUCOSE  --  159* 111*  --   BUN  --  17 13  --   CREATININE 0.67 0.89 0.60 0.61  CALCIUM  --  8.9 8.4*  --     CBC: Recent Labs  Lab 09/15/23 1458 09/16/23 0613 09/17/23 0613 09/19/23 0616  WBC 6.4 8.5 4.5 4.6  HGB 13.0 12.3 11.0* 11.4*  HCT 36.8 34.8* 32.9* 33.9*  MCV 89.3 89.2 92.2 92.4  PLT 120* 138* 148* 149*      Code Status: Full code  Family Communication: No family at bedside   CONSULTS orthopedics   Subjective   No SOB, no CP   Objective    Physical Examination:   In bed, NAD  Status is: Inpatient:          [name]   [facility] If 7PM-7AM, please contact night-coverage at [email or web address], Office  [phone]   09/22/2023, 11:01 AM  LOS: 9 days

## 2023-09-22 NOTE — Progress Notes (Signed)
Nutrition Follow-up  DOCUMENTATION CODES:   Morbid obesity  INTERVENTION:   -Continue regular diet.  -Continue Ensure Enlive po BID, each supplement provides 350 kcal and 20 grams of protein. Once intakes consistently >75%, would discontinue oral supplement.  -Please update weight.   NUTRITION DIAGNOSIS:   Increased nutrient needs related to hip fracture as evidenced by estimated needs.  -addressing with meals, oral supplements, MVI/Minerals  GOAL:   Patient will meet greater than or equal to 90% of their needs  -Progressing  MONITOR:   PO intake, Supplement acceptance, Weight trends, Labs  REASON FOR ASSESSMENT:   Follow up  ASSESSMENT:   57 y.o. Admitted related to mechanical fall injuring R lower leg, foot and hip. PMH: BPD1, CPS, morbid obesity.  Intakes recorded average 74% x 8 meals.  States she is taking the Ensure well and wants to continue. Patient notes she was eating well prior to admission with exception of one day before after fall which is resolved.  Tolerates all foods, per inspection of oral cavity, poor dentition, missing teeth. Weight not updated since 11/25. Edema non-pitting right lower extremity.  Medications reviewed and include vitamin D3 25 mcg daily, colace, MVI/Minerals-1 Tab daily.   Labs reviewed   NUTRITION - FOCUSED PHYSICAL EXAM:  Flowsheet Row Most Recent Value  Orbital Region No depletion  Upper Arm Region No depletion  Thoracic and Lumbar Region No depletion  Buccal Region No depletion  Temple Region Moderate depletion  [scooping of temporalis may be usual for this patient]  Clavicle Bone Region No depletion  Clavicle and Acromion Bone Region No depletion  Scapular Bone Region No depletion  Dorsal Hand No depletion  Patellar Region No depletion  Anterior Thigh Region No depletion  Posterior Calf Region No depletion       Diet Order:   Diet Order             Diet regular Room service appropriate? Yes; Fluid  consistency: Thin  Diet effective now                   EDUCATION NEEDS:   No education needs have been identified at this time  Skin:  Skin Assessment: Skin Integrity Issues: Skin Integrity Issues:: Incisions Incisions: R foot  Last BM:  09/21/23  Height:   Ht Readings from Last 1 Encounters:  09/15/23 5\' 8"  (1.727 m)    Weight:   Wt Readings from Last 1 Encounters:  09/15/23 127 kg    Ideal Body Weight:  65.9 kg  BMI:  Body mass index is 42.57 kg/m.  Estimated Nutritional Needs:   Kcal:  1850-2150 kcal/d  Protein:  80-120 g/d  Fluid:  10ml/kcal    Alvino Chapel, RDLD Clinical Dietitian See AMION for contact information

## 2023-09-23 ENCOUNTER — Encounter: Payer: Self-pay | Admitting: General Practice

## 2023-09-23 DIAGNOSIS — S82201A Unspecified fracture of shaft of right tibia, initial encounter for closed fracture: Secondary | ICD-10-CM | POA: Diagnosis not present

## 2023-09-23 DIAGNOSIS — S82401A Unspecified fracture of shaft of right fibula, initial encounter for closed fracture: Secondary | ICD-10-CM | POA: Diagnosis not present

## 2023-09-23 NOTE — Progress Notes (Signed)
Received request to visit with patient from Clarene Critchley in the cancer center.  Attempted visit however patient was unavailable as therapy was in progress. Advised patient that chaplain will return later on today. Patient welcomed visit.

## 2023-09-23 NOTE — Plan of Care (Signed)
  Problem: Health Behavior/Discharge Planning: Goal: Ability to manage health-related needs will improve Outcome: Progressing   Problem: Clinical Measurements: Goal: Ability to maintain clinical measurements within normal limits will improve Outcome: Progressing Goal: Will remain free from infection Outcome: Progressing Goal: Diagnostic test results will improve Outcome: Progressing Goal: Cardiovascular complication will be avoided Outcome: Progressing

## 2023-09-23 NOTE — Progress Notes (Signed)
Physical Therapy Treatment Patient Details Name: Jordan [name] MRN: [id number] DOB: Feb 17, 1966 Today's Date: 09/23/2023   History of Present Illness Pt is a 57 y/o female presenting on 11/23 after fall.  Found with R 1st tarsometatarsal fx, R tibia refracture and R fibula fx.  11/25 S/P ORIF 1st tarsometatarsal fx and closed treatment of R tibia fx. PMH includes: alcohol abuse, anemia, bipolar 1, CAP, sepsis, UTI, obesity.    PT Comments  Received pt semi-reclined in bed and agreeable to PT treatment. Pt performed bed mobility with supervision using bed features with increased time/effort. Attempted to stand from elevated EOB using bari RW. Pt required max multimodal cues for technique and began to initiate stand, but then would stop and sit down, stating "I'm sorry". Pt ultimately limited by fear and increased pain in RLE - therefore performed lateral scoot bed<>drop arm recliner with min A of 1 with increased time and cues for sequencing. Pt needs continued practice with technique/safety with lateral scooting. Continue to recommend further rehab <3 hrs/day to address current deficits. Acute PT to cont to follow.     If plan is discharge home, recommend the following: Two people to help with walking and/or transfers;A lot of help with bathing/dressing/bathroom;Assistance with cooking/housework;Direct supervision/assist for medications management;Assist for transportation;Help with stairs or ramp for entrance;Supervision due to cognitive status   Can travel by private vehicle     No  Equipment Recommendations  Other (comment) (TBD post-acute)    Recommendations for Other Services       Precautions / Restrictions Precautions Precautions: Fall Required Braces or Orthoses: Splint/Cast Splint/Cast: RLE short leg cast Splint/Cast - Date Prophylactic Dressing Applied (if applicable): 09/15/23 Restrictions Weight Bearing Restrictions: Yes RLE Weight Bearing: Non weight bearing     Mobility   Bed Mobility Overal bed mobility: Needs Assistance Bed Mobility: Rolling, Supine to Sit Rolling: Supervision   Supine to sit: Supervision     General bed mobility comments: HOB elevated and use of bedrails Patient Response: Anxious  Transfers Overall transfer level: Needs assistance Equipment used: None Transfers: Bed to chair/wheelchair/BSC            Lateral/Scoot Transfers: Min assist General transfer comment: Attempted to stand from elevated EOB with RW. Pt would initate movement, then stop staying "I'm sorry" - limited by fear and increased pain in RLE.    Ambulation/Gait               General Gait Details: unable   Stairs             Wheelchair Mobility     Tilt Bed Tilt Bed Patient Response: Anxious  Modified Rankin (Stroke Patients Only)       Balance Overall balance assessment: Needs assistance Sitting-balance support: No upper extremity supported, Feet supported Sitting balance-Leahy Scale: Fair         Standing balance comment: unable to stand                            Cognition Arousal: Alert Behavior During Therapy: Anxious Overall Cognitive Status: No family/caregiver present to determine baseline cognitive functioning Area of Impairment: Memory, Following commands, Safety/judgement, Problem solving, Awareness, Attention                     Memory: Decreased short-term memory, Decreased recall of precautions Following Commands: Follows one step commands consistently, Follows one step commands with increased time, Follows multi-step commands inconsistently Safety/Judgement: Decreased awareness  of deficits, Decreased awareness of safety   Problem Solving: Slow processing, Requires verbal cues, Requires tactile cues, Difficulty sequencing General Comments: pt required significantly increased time with mobility. Pt demo poor sequencing and inability to follow cues for technqiue        Exercises       General Comments        Pertinent Vitals/Pain Pain Assessment Pain Assessment: 0-10 Pain Score: 10-Worst pain ever Pain Location: R LE Pain Descriptors / Indicators: Aching, Discomfort, Grimacing, Moaning Pain Intervention(s): Limited activity within patient's tolerance, Monitored during session, Repositioned, Patient requesting pain meds-RN notified    Home Living                          Prior Function            PT Goals (current goals can now be found in the care plan section) Acute Rehab PT Goals Patient Stated Goal: to go home PT Goal Formulation: With patient Time For Goal Achievement: 09/30/23 Potential to Achieve Goals: Fair Progress towards PT goals: Progressing toward goals    Frequency    Min 1X/week      PT Plan      Co-evaluation              AM-PAC PT "6 Clicks" Mobility   Outcome Measure  Help needed turning from your back to your side while in a flat bed without using bedrails?: A Little Help needed moving from lying on your back to sitting on the side of a flat bed without using bedrails?: A Little Help needed moving to and from a bed to a chair (including a wheelchair)?: A Little Help needed standing up from a chair using your arms (e.g., wheelchair or bedside chair)?: Total Help needed to walk in hospital room?: Total Help needed climbing 3-5 steps with a railing? : Total 6 Click Score: 12    End of Session Equipment Utilized During Treatment: Gait belt Activity Tolerance: Patient limited by pain Patient left: with call bell/phone within reach;in chair;with chair alarm set Nurse Communication: Mobility status;Patient requests pain meds PT Visit Diagnosis: Other abnormalities of gait and mobility (R26.89);Muscle weakness (generalized) (M62.81);Pain Pain - Right/Left: Right Pain - part of body: Leg     Time: 0801-0824 PT Time Calculation (min) (ACUTE ONLY): 23 min  Charges:    $Therapeutic Activity: 23-37 mins PT  General Charges $$ ACUTE PT VISIT: 1 Visit                     Jordan [name] PT, DPT Jordan [name] 09/23/2023, 9:27 AM

## 2023-09-23 NOTE — TOC Progression Note (Signed)
Transition of Care [facility]) - Progression Note    Patient Details  Name: Jordan [name] MRN: [id number] Date of Birth: [birth year]  [facility] CM/SW Contact  Lorri Frederick, LCSW Phone Number: 09/23/2023, 11:34 AM  Clinical Narrative:   Message from [facility].  Auth still pending.      Expected Discharge Plan: Skilled Nursing Facility Barriers to Discharge: Insurance Authorization  Expected Discharge Plan and Services In-house Referral: Clinical Social Work   Post Acute Care Choice: IP Rehab Living arrangements for the past 2 months: Single Family Home                                       Social Determinants of Health (SDOH) Interventions SDOH Screenings   Food Insecurity: No Food Insecurity (09/14/2023)  Recent Concern: Food Insecurity - Food Insecurity Present (07/16/2023)  Housing: Low Risk  (09/14/2023)  Transportation Needs: No Transportation Needs (09/14/2023)  Recent Concern: Transportation Needs - Unmet Transportation Needs (07/16/2023)  Utilities: Not At Risk (09/14/2023)  Depression (PHQ2-9): Low Risk  (07/16/2023)  Tobacco Use: Low Risk  (09/15/2023)    Readmission Risk Interventions    08/22/2022    3:08 PM 08/19/2022   11:26 AM  Readmission Risk Prevention Plan  Post Dischage Appt  Complete  Medication Screening  Complete  Transportation Screening Complete Complete  PCP or Specialist Appt within 5-7 Days Complete   Home Care Screening Complete   Medication Review (RN CM) Complete

## 2023-09-23 NOTE — Progress Notes (Signed)
Mobility Specialist: Progress Note   09/23/23 1157  Mobility  Activity Transferred from chair to bed;Turned to right side;Turned to left side  Level of Assistance Standby assist, set-up cues, supervision of patient - no hands on  Range of Motion/Exercises Right leg;Left leg;Active  RLE Weight Bearing NWB  Activity Response Tolerated well  Mobility Referral Yes  $Mobility charge 1 Mobility  Mobility Specialist Start Time (ACUTE ONLY) 1051  Mobility Specialist Stop Time (ACUTE ONLY) 1120  Mobility Specialist Time Calculation (min) (ACUTE ONLY) 29 min    Pt was agreeable to mobility session - received in chair. Performed seated marches and knee kicks (20x each leg) ind. Completed transfer via lateral scoot towards her L side under SV with extra time needed and max cues to adhere to WB status during scoot and in bed. No physical assistance needed throughout other than moving the chair to other side of bed. Left in bed with all needs met, call bell in reach. Bed alarm on.   Maurene Capes Mobility Specialist Please contact via SecureChat or Rehab office at [phone]

## 2023-09-23 NOTE — Plan of Care (Signed)

## 2023-09-23 NOTE — Progress Notes (Signed)
[facility]  PROGRESS NOTE  Jordan [name] ZOX:[id number] DOB: 1966-07-01 DOA: 09/13/2023 PCP: Courtney Paris, NP   Brief HPI:   57 y.o. female with medical history significant of BPD1, CPS, morbid obesity.   Pt in to ED after mechanical fall.  Injured R lower leg and foot in fall.  Inability to bear wt or ambulate following fall.  Pain severe. S/p surgery and now TOC is working on placement.      Assessment/Plan:   Closed fracture of right tibia and fibula Displaced R first metatarsal fx. Non-displaced preiprosthetic R tibia fx, right fibula fracture -Orthopedics consulted -Underwent ORIF right first tarsometatarsal fracture/dislocation -Closed treatment of right tibia fracture -Continue pain management  -PT/OT- SNF- TOC consulted   Chronic pain -Continue oxycodone as above   Morbid obesity  Estimated body mass index is 42.57 kg/m as calculated from the following:   Height as of this encounter: 5\' 8"  (1.727 m).   Weight as of this encounter: 127 kg.    Thrombocytopenia -trending up   Medications     cholecalciferol  1,000 Units Oral Daily   divalproex  250 mg Oral QHS   docusate sodium  100 mg Oral BID   enoxaparin (LOVENOX) injection  40 mg Subcutaneous Q24H   feeding supplement  237 mL Oral BID BM   multivitamin with minerals  1 tablet Oral Daily   venlafaxine XR  75 mg Oral Q breakfast     Data Reviewed:   CBG:  No results for input(s): "GLUCAP" in the last 168 hours.  SpO2: 97 % O2 Flow Rate (L/min): 0 L/min    Vitals:   09/22/23 1440 09/22/23 1931 09/23/23 0441 09/23/23 0858  BP: 117/67 (!) 146/87 (!) 154/100 101/63  Pulse: 98 93 88 98  Resp: 18 18 18 18   Temp: 98 F (36.7 C) 98.3 F (36.8 C) 98.2 F (36.8 C) 98 F (36.7 C)  TempSrc: Oral Oral Oral Oral  SpO2: 97% 97% 97% 97%  Weight:      Height:          Data Reviewed:  Basic Metabolic Panel: Recent Labs  Lab 09/19/23 0616 09/22/23 0556  NA 136  --   K 4.5  --   CL 102   --   CO2 25  --   GLUCOSE 111*  --   BUN 13  --   CREATININE 0.60 0.61  CALCIUM 8.4*  --     CBC: Recent Labs  Lab 09/17/23 0613 09/19/23 0616  WBC 4.5 4.6  HGB 11.0* 11.4*  HCT 32.9* 33.9*  MCV 92.2 92.4  PLT 148* 149*      Code Status: Full code  Family Communication: No family at bedside   CONSULTS orthopedics   Subjective   No SOB, no CP   Objective    Physical Examination:   Sitting in chair, NAD Rrr No increased work of breathing   Status is: Inpatient:          [name]   [facility] If 7PM-7AM, please contact night-coverage at [email or web address], Office  [phone]   09/23/2023, 12:01 PM  LOS: 10 days

## 2023-09-23 NOTE — Progress Notes (Signed)
[facility] Spiritual Care Note  Returned call from Basmah for processing updates about her healing process and disposition plan. She would really value an in-person chaplain visit to help with social isolation and loneliness; placed a referral with [facility] Spiritual Care team per her request. She plans to follow up with [facility] chaplain as well when there are more updates.   [address] [name], [place], [facility] Pager [phone] Voicemail [phone]

## 2023-09-24 DIAGNOSIS — S82201A Unspecified fracture of shaft of right tibia, initial encounter for closed fracture: Secondary | ICD-10-CM | POA: Diagnosis not present

## 2023-09-24 DIAGNOSIS — G8929 Other chronic pain: Secondary | ICD-10-CM | POA: Diagnosis not present

## 2023-09-24 DIAGNOSIS — S92311A Displaced fracture of first metatarsal bone, right foot, initial encounter for closed fracture: Secondary | ICD-10-CM | POA: Diagnosis not present

## 2023-09-24 NOTE — Progress Notes (Signed)
[facility]                                                                              Jayelyn Banther, is a 57 y.o. female, DOB - Nov 15, 1965, HKV:[id number] Admit date - 09/13/2023    Outpatient Primary MD for the patient is Courtney Paris, NP  LOS - 11  days  Chief Complaint  Patient presents with   Fall       Brief summary   Patient is a 57 year old female with history of bipolar disorder, obesity, presented to ED after mechanical fall.  Patient injured her right lower leg and foot in the fall and was unable to bear weight or ambulate after the fall.  Pain was severe. She was found to have closed fracture of right tibia and fibula, displaced right first metatarsal fracture Status post surgery  Assessment & Plan    Principal Problem:   Closed fracture of right tibia and fibula Closed displaced fracture of the first metatarsal bone of the right foot -Orthopedics was consulted, patient underwent open reduction, internal fixation of the right first tarsometatarsal fracture/dislocation on 11/25 -Underwent closed treatment of right tibia fracture on 11/25 -Orthopedics following, recommended Eliquis 2.5 mg twice daily for 30 days for DVT prophylaxis -Continue vitamin D 1000 units daily, pain control, PT OT   Active Problems: Chronic pain -Continue oxycodone as needed for chronic, moderate pain  Thrombocytopenia -Improved    Morbid obesity with BMI of 40.0-44.9, adult (HCC) Estimated body mass index is 42.57 kg/m as calculated from the following:   Height as of this encounter: 5\' 8"  (1.727 m).   Weight as of this encounter: 127 kg.  Code Status: Full code DVT Prophylaxis:  enoxaparin (LOVENOX) injection 40 mg Start: 09/16/23 0800 SCDs Start: 09/15/23 1404 SCDs Start: 09/13/23 2304   Level of Care: Level of care: Med-Surg Family Communication: Updated patient Disposition Plan:      Remains inpatient appropriate: Awaiting placement   Procedures:  11/25 -open reduction, internal fixation of the right first tarsometatarsal fracture/dislocation -Underwent closed treatment of right tibia fracture  Consultants:   Orthopedics  Antimicrobials:   Anti-infectives (From admission, onward)    Start     Dose/Rate Route Frequency Ordered Stop   09/15/23 1800  ceFAZolin (ANCEF) IVPB 2g/100 mL premix        2 g 200 mL/hr over 30 Minutes Intravenous Every 8 hours 09/15/23 1403 09/16/23 2054   09/15/23 1210  vancomycin (VANCOCIN) powder  Status:  Discontinued          As needed 09/15/23 1217 09/15/23 1223          Medications  cholecalciferol  1,000 Units Oral Daily   divalproex  250 mg Oral QHS   docusate sodium  100 mg Oral BID   enoxaparin (LOVENOX) injection  40 mg Subcutaneous Q24H   feeding supplement  237 mL Oral BID BM   multivitamin with minerals  1 tablet Oral Daily   venlafaxine XR  75 mg Oral Q breakfast      Subjective:   Female Masis was seen and examined today.  No acute complaints.  Pain controlled.  Awaiting placement.  No acute issues overnight.    Objective:   Vitals:   09/23/23 1500 09/23/23 1957 09/24/23 0812 09/24/23 1328  BP: 117/67 (!) 157/74 (!) 103/56 115/66  Pulse: 85 (!) 101 72 68  Resp: 18 18 17 18   Temp: 98 F (36.7 C) 97.7 F (36.5 C) 98.4 F (36.9 C) 98.5 F (36.9 C)  TempSrc: Oral Oral Oral Oral  SpO2: 97% 96% 96% 98%  Weight:      Height:        Intake/Output Summary (Last 24 hours) at 09/24/2023 1332 Last data filed at 09/24/2023 0250 Gross per 24 hour  Intake --  Output 1100 ml  Net -1100 ml     Wt Readings from Last 3 Encounters:  09/15/23 127 kg  09/05/23 127.4 kg  08/18/23 126.4 kg     Exam General: Alert and oriented x 3, NAD Cardiovascular: S1 S2 auscultated,  RRR Respiratory: Clear to auscultation bilaterally, no wheezing Gastrointestinal: Soft, nontender, nondistended, + bowel sounds Ext: no pedal edema bilaterally Neuro: no new deficits Psych: Normal  affect     Data Reviewed:  I have personally reviewed following labs    CBC Lab Results  Component Value Date   WBC 4.6 09/19/2023   RBC 3.67 (L) 09/19/2023   HGB 11.4 (L) 09/19/2023   HCT 33.9 (L) 09/19/2023   MCV 92.4 09/19/2023   MCH 31.1 09/19/2023   PLT 149 (L) 09/19/2023   MCHC 33.6 09/19/2023   RDW 13.1 09/19/2023   LYMPHSABS 0.9 09/13/2023   MONOABS 0.8 09/13/2023   EOSABS 0.0 09/13/2023   BASOSABS 0.0 09/13/2023     Last metabolic panel Lab Results  Component Value Date   NA 136 09/19/2023   K 4.5 09/19/2023   CL 102 09/19/2023   CO2 25 09/19/2023   BUN 13 09/19/2023   CREATININE 0.61 09/22/2023   GLUCOSE 111 (H) 09/19/2023   GFRNONAA >60 09/22/2023   GFRAA >60 05/03/2020   CALCIUM 8.4 (L) 09/19/2023   PHOS 4.6 08/22/2022   PROT 6.1 (L) 11/21/2022   ALBUMIN 3.3 (L) 11/21/2022   BILITOT 0.8 11/21/2022   ALKPHOS 93 11/21/2022   AST 17 11/21/2022   ALT 14 11/21/2022   ANIONGAP 9 09/19/2023    CBG (last 3)  No results for input(s): "GLUCAP" in the last 72 hours.    Coagulation Profile: No results for input(s): "INR", "PROTIME" in the last 168 hours.   Radiology Studies: I have personally reviewed the imaging studies  No results found.     Thad Ranger M.D. [facility] 09/24/2023, 1:32 PM  Available via Epic secure chat 7am-7pm After 7 pm, please refer to night coverage provider listed on amion.

## 2023-09-24 NOTE — Progress Notes (Signed)
   09/24/23 1601  Spiritual Encounters  Type of Visit Follow up  Care provided to: Patient  Referral source Chaplain team  Reason for visit Routine spiritual support  OnCall Visit No   Visited with patient who was dealing with a broken foot from a fall. Patient also concerned about possible discharge. Patient will need rehab, however the doesn't think her insurance will cover. Patient also concerned about returning home as she is bullied by neighbor about moving, however patient unable to move to new residence as it is above he income.    We had a conversation  about patient's decease spouse and how she has been handling it.also discussed her relationship with daughter who lives long distance and wanting to visit for christmas which will not be possible.   Patient welcomes return visits from chaplains.

## 2023-09-24 NOTE — TOC Progression Note (Signed)
Transition of Care [facility]) - Progression Note    Patient Details  Name: Jordan [name] MRN: [id number] Date of Birth: 1965/12/25  [facility] CM/SW Contact  Lorri Frederick, LCSW Phone Number: 09/24/2023, 2:23 PM  Clinical Narrative:   Pt still awaiting insurance authorization.  Pt updated that auth still pending. TOC will continue to follow.    Expected Discharge Plan: Skilled Nursing Facility Barriers to Discharge: Insurance Authorization  Expected Discharge Plan and Services In-house Referral: Clinical Social Work   Post Acute Care Choice: IP Rehab Living arrangements for the past 2 months: Single Family Home                                       Social Determinants of Health (SDOH) Interventions SDOH Screenings   Food Insecurity: No Food Insecurity (09/14/2023)  Recent Concern: Food Insecurity - Food Insecurity Present (07/16/2023)  Housing: Low Risk  (09/14/2023)  Transportation Needs: No Transportation Needs (09/14/2023)  Recent Concern: Transportation Needs - Unmet Transportation Needs (07/16/2023)  Utilities: Not At Risk (09/14/2023)  Depression (PHQ2-9): Low Risk  (07/16/2023)  Tobacco Use: Low Risk  (09/15/2023)    Readmission Risk Interventions    08/22/2022    3:08 PM 08/19/2022   11:26 AM  Readmission Risk Prevention Plan  Post Dischage Appt  Complete  Medication Screening  Complete  Transportation Screening Complete Complete  PCP or Specialist Appt within 5-7 Days Complete   Home Care Screening Complete   Medication Review (RN CM) Complete

## 2023-09-24 NOTE — Plan of Care (Signed)
  Problem: Education: Goal: Knowledge of General Education information will improve Description: Including pain rating scale, medication(s)/side effects and non-pharmacologic comfort measures Outcome: Progressing   Problem: Health Behavior/Discharge Planning: Goal: Ability to manage health-related needs will improve Outcome: Progressing   Problem: Clinical Measurements: Goal: Ability to maintain clinical measurements within normal limits will improve Outcome: Progressing Goal: Will remain free from infection Outcome: Progressing Goal: Diagnostic test results will improve Outcome: Progressing Goal: Cardiovascular complication will be avoided Outcome: Progressing   Problem: Activity: Goal: Risk for activity intolerance will decrease Outcome: Progressing   Problem: Nutrition: Goal: Adequate nutrition will be maintained Outcome: Progressing   Problem: Pain Management: Goal: General experience of comfort will improve Outcome: Progressing   Problem: Safety: Goal: Ability to remain free from injury will improve Outcome: Progressing   Problem: Skin Integrity: Goal: Risk for impaired skin integrity will decrease Outcome: Progressing

## 2023-09-24 NOTE — Therapy (Unsigned)
OUTPATIENT PHYSICAL THERAPY FEMALE PELVIC EVALUATION   Patient Name: Jordan [name] MRN: [id number] DOB:1966-02-15, 57 y.o., female Today's Date: 09/24/2023  END OF SESSION:   Past Medical History:  Diagnosis Date   Alcohol abuse    Anemia    patient denies   Bipolar 1 disorder (HCC)    No medications currently   CAP (community acquired pneumonia) 03/17/2015   History of radiation therapy 08/24/19-09/22/19   Vaginal brachytherapy   Dr. 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   Mental disorder    Obesity    PICC line infection 05/17/2015   Sepsis due to Gram negative bacteria (MDR E Coli) 02/18/2015   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HCC)    UTI (lower urinary tract infection)    Vaginal Pap smear, abnormal    Past Surgical History:  Procedure Laterality Date   ABDOMINAL HYSTERECTOMY     CERVICAL CONIZATION W/BX N/A 07/01/2016   Procedure: CONIZATION CERVIX WITH BIOPSY;  Surgeon: Hermina Staggers, MD;  Location: [facility];  Service: Gynecology;  Laterality: N/A;   CHOLECYSTECTOMY     EXTERNAL FIXATION LEG Right 04/09/2020   Procedure: EXTERNAL FIXATION LEG;  Surgeon: Bjorn Pippin, MD;  Location: [facility];  Service: Orthopedics;  Laterality: Right;   HYSTEROSCOPY WITH D & C N/A 01/25/2016   Procedure: DILATATION AND CURETTAGE /HYSTEROSCOPY;  Surgeon: Catalina Antigua, MD;  Location: [facility];  Service: Gynecology;  Laterality: N/A;   LYMPH NODE BIOPSY Bilateral 07/22/2019   Procedure: LYMPH NODE BIOPSY;  Surgeon: Adolphus Birchwood, MD;  Location: [facility];  Service: Gynecology;  Laterality: Bilateral;   OPEN REDUCTION INTERNAL FIXATION (ORIF) FOOT LISFRANC FRACTURE Right 09/15/2023   Procedure: OPEN REDUCTION INTERNAL FIXATION (ORIF) FOOT LISFRANC FRACTURE;  Surgeon: Roby Lofts, MD;  Location: [facility];  Service: Orthopedics;  Laterality: Right;   OPEN REDUCTION INTERNAL FIXATION (ORIF) TIBIA/FIBULA FRACTURE Right 04/10/2020   Procedure: OPEN REDUCTION INTERNAL  FIXATION (ORIF) TIBIA/FIBULA FRACTURE;  Surgeon: Roby Lofts, MD;  Location: [facility];  Service: Orthopedics;  Laterality: Right;   ROBOT ASSISTED MYOMECTOMY N/A 07/22/2019   Procedure: XI ROBOTIC ASSISTED LAPAROSCOPIC RADICAL UPPER VAGINECTOMY, LEFT SALPINECTOMY, RIGHT SALPINGOOOPHERECTOMY;  Surgeon: Adolphus Birchwood, MD;  Location: [facility];  Service: Gynecology;  Laterality: N/A;   VAGINAL HYSTERECTOMY N/A 03/11/2017   Procedure: HYSTERECTOMY VAGINAL WITH MORCELLATION;  Surgeon: Hermina Staggers, MD;  Location: [facility];  Service: Gynecology;  Laterality: N/A;   Patient Active Problem List   Diagnosis Date Noted   Closed fracture of right tibia and fibula 09/13/2023   Closed displaced fracture of first metatarsal bone of right foot 09/13/2023   Thoracic radiculitis 02/10/2023   Schizophrenia (HCC) 02/10/2023   Pain in thoracic spine 02/10/2023   Moderate intellectual disability 02/10/2023   Lumbar radiculopathy 02/10/2023   Chronic pain 02/10/2023   Cystitis 01/16/2023   Dysuria 12/12/2022   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   Vitamin B12 deficiency 08/19/2022   Complicated UTI (urinary tract infection) 08/17/2022   Grief 04/17/2022   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   Menopausal symptoms 05/02/2021   Urge incontinence of urine 12/26/2020   Leukopenia    Vitamin D deficiency    Acute lower UTI    Morbid obesity with BMI of 40.0-44.9, adult (HCC) 06/11/2019   Vaginal cancer (HCC) 05/28/2019   Visit for routine gyn exam 04/21/2019   Vaginal atrophy 04/21/2019   Urinary tract infection with hematuria 07/01/2015   Diastolic dysfunction 07/01/2015   Constipation 05/12/2015  ESBL (extended spectrum beta-lactamase) producing bacteria infection 03/17/2015   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  Rationale for Evaluation and Treatment: Rehabilitation  ONSET DATE: ***  SUBJECTIVE:                                                                                                                                                                                           SUBJECTIVE STATEMENT: *** Fluid intake: {Yes/No:304960894}   PAIN:  Are you having pain? {yes/no:20286} NPRS scale: ***/10 Pain location: {pelvic pain location:27098}  Pain type: {type:313116} Pain description: {PAIN DESCRIPTION:21022940}   Aggravating factors: *** Relieving factors: ***  PRECAUTIONS: {Therapy precautions:24002}  RED FLAGS: {PT Red Flags:29287}   WEIGHT BEARING RESTRICTIONS: {Yes ***/No:24003}  FALLS:  Has patient fallen in last 6 months? {fallsyesno:27318}  LIVING ENVIRONMENT: Lives with: {OPRC lives with:25569::"lives with their family"} Lives in: {Lives in:25570} Stairs: {opstairs:27293} Has following equipment at home: {Assistive devices:23999}  OCCUPATION: ***  PLOF: {PLOF:24004}  PATIENT GOALS: ***  PERTINENT HISTORY:  *** Sexual abuse: {Yes/No:304960894}  BOWEL MOVEMENT: Pain with bowel movement: {yes/no:20286} Type of bowel movement:{PT BM type:27100} Fully empty rectum: {Yes/No:304960894} Leakage: {Yes/No:304960894} Pads: {Yes/No:304960894} Fiber supplement: {Yes/No:304960894}  URINATION: Pain with urination: {yes/no:20286} Fully empty bladder: {Yes/No:304960894} Stream: {PT urination:27102} Urgency: {Yes/No:304960894} Frequency: *** Leakage: {PT leakage:27103} Pads:  {Yes/No:304960894}  INTERCOURSE: Pain with intercourse: {pain with intercourse PA:27099} Ability to have vaginal penetration:  {Yes/No:304960894} Climax: *** Marinoff Scale: ***/3  PREGNANCY: Vaginal deliveries *** Tearing {Yes***/No:304960894} C-section deliveries *** Currently pregnant {Yes***/No:304960894}  PROLAPSE: {PT prolapse:27101}   OBJECTIVE:  Note: Objective measures were completed at Evaluation unless otherwise noted.  DIAGNOSTIC FINDINGS:  ***  PATIENT SURVEYS:  {rehab surveys:24030}  PFIQ-7 ***  COGNITION: Overall cognitive status: {cognition:24006}     SENSATION: Light touch: {intact/deficits:24005} Proprioception: {intact/deficits:24005}  MUSCLE LENGTH: Hamstrings: Right *** deg; Left *** deg Thomas test: Right *** deg; Left *** deg  LUMBAR SPECIAL TESTS:  {lumbar special test:25242}  FUNCTIONAL TESTS:  {Functional tests:24029}  GAIT: Distance walked: *** Assistive device utilized: {Assistive devices:23999} Level  of assistance: {Levels of assistance:24026} Comments: ***  POSTURE: {posture:25561}  PELVIC ALIGNMENT:  LUMBARAROM/PROM:  A/PROM A/PROM  eval  Flexion   Extension   Right lateral flexion   Left lateral flexion   Right rotation   Left rotation    (Blank rows = not tested)  LOWER EXTREMITY ROM:  {AROM/PROM:27142} ROM Right eval Left eval  Hip flexion    Hip extension    Hip abduction    Hip adduction    Hip internal rotation    Hip external rotation    Knee flexion    Knee extension    Ankle dorsiflexion    Ankle plantarflexion    Ankle inversion    Ankle eversion     (Blank rows = not tested)  LOWER EXTREMITY MMT:  MMT Right eval Left eval  Hip flexion    Hip extension    Hip abduction    Hip adduction    Hip internal rotation    Hip external rotation    Knee flexion    Knee extension    Ankle dorsiflexion    Ankle plantarflexion    Ankle inversion    Ankle eversion     PALPATION:    General  ***                External Perineal Exam ***                             Internal Pelvic Floor ***  Patient confirms identification and approves PT to assess internal pelvic floor and treatment {yes/no:20286}  PELVIC MMT:   MMT eval  Vaginal   Internal Anal Sphincter   External Anal Sphincter   Puborectalis   Diastasis Recti   (Blank rows = not tested)        TONE: ***  PROLAPSE: ***  TODAY'S TREATMENT:                                                                                                                              DATE: ***  EVAL ***   PATIENT EDUCATION:  Education details: *** Person educated: {Person educated:25204} Education method: {Education Method:25205} Education comprehension: {Education Comprehension:25206}  HOME EXERCISE PROGRAM: ***  ASSESSMENT:  CLINICAL IMPRESSION: Patient is a *** y.o. *** who was seen today for physical therapy evaluation and treatment for ***.   OBJECTIVE IMPAIRMENTS: {opptimpairments:25111}.   ACTIVITY LIMITATIONS: {activitylimitations:27494}  PARTICIPATION LIMITATIONS: {participationrestrictions:25113}  PERSONAL FACTORS: {Personal factors:25162} are also affecting patient's functional outcome.   REHAB POTENTIAL: {rehabpotential:25112}  CLINICAL DECISION MAKING: {clinical decision making:25114}  EVALUATION COMPLEXITY: {Evaluation complexity:25115}   GOALS: Goals reviewed with patient? {yes/no:20286}  SHORT TERM GOALS: Target date: ***  *** Baseline: Goal status: INITIAL  2.  *** Baseline:  Goal status: INITIAL  3.  *** Baseline:  Goal status: INITIAL  4.  *** Baseline:  Goal status: INITIAL  5.  *** Baseline:  Goal status: INITIAL  6.  *** Baseline:  Goal status: INITIAL  LONG TERM GOALS: Target date: ***  *** Baseline:  Goal status: INITIAL  2.  *** Baseline:  Goal status: INITIAL  3.  *** Baseline:  Goal status: INITIAL  4.  *** Baseline:  Goal status:  INITIAL  5.  *** Baseline:  Goal status: INITIAL  6.  *** Baseline:  Goal status: INITIAL  PLAN:  PT FREQUENCY: {rehab frequency:25116}  PT DURATION: {rehab duration:25117}  PLANNED INTERVENTIONS: {rehab planned interventions:25118::"97110-Therapeutic exercises","97530- Therapeutic [phone]- Neuromuscular re-education","97535- Self GNFA","21308- Manual therapy"}  PLAN FOR NEXT SESSION: ***   Cris Talavera, PT 09/24/2023, 8:20 AM

## 2023-09-25 ENCOUNTER — Encounter: Payer: Self-pay | Admitting: General Practice

## 2023-09-25 ENCOUNTER — Ambulatory Visit: Payer: MEDICAID | Admitting: Physical Therapy

## 2023-09-25 DIAGNOSIS — S82201A Unspecified fracture of shaft of right tibia, initial encounter for closed fracture: Secondary | ICD-10-CM | POA: Diagnosis not present

## 2023-09-25 DIAGNOSIS — G894 Chronic pain syndrome: Secondary | ICD-10-CM | POA: Diagnosis not present

## 2023-09-25 DIAGNOSIS — S82401A Unspecified fracture of shaft of right fibula, initial encounter for closed fracture: Secondary | ICD-10-CM | POA: Diagnosis not present

## 2023-09-25 LAB — BASIC METABOLIC PANEL
Anion gap: 8 (ref 5–15)
BUN: 19 mg/dL (ref 6–20)
CO2: 26 mmol/L (ref 22–32)
Calcium: 8.3 mg/dL — ABNORMAL LOW (ref 8.9–10.3)
Chloride: 105 mmol/L (ref 98–111)
Creatinine, Ser: 0.72 mg/dL (ref 0.44–1.00)
GFR, Estimated: >60 mL/min (ref >60)
Glucose, Bld: 92 mg/dL (ref 70–99)
Potassium: 4.3 mmol/L (ref 3.5–5.1)
Sodium: 139 mmol/L (ref 135–145)

## 2023-09-25 LAB — CBC
HCT: 35 % — ABNORMAL LOW (ref 36.0–46.0)
Hemoglobin: 11.2 g/dL — ABNORMAL LOW (ref 12.0–15.0)
MCH: 30.2 pg (ref 26.0–34.0)
MCHC: 32 g/dL (ref 30.0–36.0)
MCV: 94.3 fL (ref 80.0–100.0)
Platelets: 198 10*3/uL (ref 150–400)
RBC: 3.71 MIL/uL — ABNORMAL LOW (ref 3.87–5.11)
RDW: 13.7 % (ref 11.5–15.5)
WBC: 4.7 10*3/uL (ref 4.0–10.5)
nRBC: 0 % (ref 0.0–0.2)

## 2023-09-25 NOTE — Progress Notes (Addendum)
Occupational Therapy Treatment Patient Details Name: Jordan [name] MRN: [id number] DOB: [birth year] Today's Date: 09/25/2023   History of present illness Pt is a 57 y/o female presenting on 11/23 after fall.  Found with R 1st tarsometatarsal fx, R tibia refracture and R fibula fx.  11/25 S/P ORIF 1st tarsometatarsal fx and closed treatment of R tibia fx. PMH includes: alcohol abuse, anemia, bipolar 1, CAP, sepsis, UTI, obesity.   OT comments  Pt assisted to/from DABSC, commode from facilities is too low and is not adjustable, pt requiring increased time, increased cueing to transfer from recliner to [facility], DABSC to bed.  Poor ability to sequence and follow commands, highly encouraged head/hip relationship as well as anterior lean but pt consistently leans posteriorly even after being cued to stay forward. Overall needing min-mod assist for transfer, mod assist for toileting. She would do better with adjustable DABSC (located one but it was in use and unavailable) and possible use of sliding board to provide visual feedback on direction to transfer.  Noted improved ability to maintain NWB precautions to R LE during transfers and ADLs. Discussed recommendations for w/c level, pt becoming frustrated and reporting "she will not use a wheelchair" but agreeable once further education as a temporary means until she is able to weight bear through her R LE.  Will follow acutely, continue to recommend inpatient setting with <3hrs/day.       If plan is discharge home, recommend the following:  A lot of help with bathing/dressing/bathroom;Assistance with cooking/housework;Assist for transportation;Help with stairs or ramp for entrance;A little help with walking and/or transfers   Equipment Recommendations  Wheelchair (measurements OT);Wheelchair cushion (measurements OT);BSC/3in1;Hospital bed (drop arm bsc)    Recommendations for Other Services      Precautions / Restrictions Precautions Precautions:  Fall Required Braces or Orthoses: Splint/Cast Splint/Cast: RLE short leg cast Splint/Cast - Date Prophylactic Dressing Applied (if applicable): 09/15/23 Restrictions Weight Bearing Restrictions: Yes RLE Weight Bearing: Non weight bearing       Mobility Bed Mobility Overal bed mobility: Needs Assistance Bed Mobility: Rolling, Sit to Supine Rolling: Supervision     Sit to supine: Supervision   General bed mobility comments: cueing to avoid pushing through L LE once back in bed    Transfers Overall transfer level: Needs assistance Equipment used: None Transfers: Bed to chair/wheelchair/BSC            Lateral/Scoot Transfers: Min assist, Mod assist General transfer comment: lateral scoot from recliner to [facility] with min assist and increased time, transferred from [facility] to EOB (going up hill) with pt not following commands to sequence and follow head/hip relationship.  Ultimately she was able to scoot partially onto EOB and then roll into bed.     Balance Overall balance assessment: Needs assistance Sitting-balance support: No upper extremity supported, Feet supported Sitting balance-Leahy Scale: Fair                                     ADL either performed or assessed with clinical judgement   ADL Overall ADL's : Needs assistance/impaired     Grooming: Supervision/safety;Sitting                   Toilet Transfer: Minimal assistance;Moderate assistance Toilet Transfer Details (indicate cue type and reason): lateral scoot to drop arm bsc Toileting- Clothing Manipulation and Hygiene: Moderate assistance;Sitting/lateral lean Toileting - Clothing Manipulation Details (indicate cue type and  reason): pt able to complete anterior peri care, but requires total assist for posterior peri care     Functional mobility during ADLs: Minimal assistance;Cueing for safety;Cueing for sequencing General ADL Comments: utilized DABSC in room, but it is really too  low for pt to safely transfer (we managed, but do not recommend her use this one again)    Extremity/Trunk Assessment              Vision       Perception     Praxis      Cognition Arousal: Alert Behavior During Therapy: Anxious Overall Cognitive Status: No family/caregiver present to determine baseline cognitive functioning Area of Impairment: Memory, Following commands, Safety/judgement, Problem solving, Awareness, Attention                   Current Attention Level: Sustained Memory: Decreased short-term memory, Decreased recall of precautions Following Commands: Follows one step commands consistently, Follows one step commands with increased time, Follows multi-step commands inconsistently Safety/Judgement: Decreased awareness of deficits, Decreased awareness of safety Awareness: Emergent Problem Solving: Slow processing, Requires verbal cues, Requires tactile cues, Difficulty sequencing General Comments: pt required significantly increased time with mobility. Pt demo poor sequencing and inability to follow cues for technqiue        Exercises      Shoulder Instructions       General Comments notified RN of BM, and not to use DABSC in room with pt at this time    Pertinent Vitals/ Pain       Pain Assessment Pain Assessment: Faces Faces Pain Scale: Hurts little more Pain Location: R LE Pain Descriptors / Indicators: Aching, Discomfort, Grimacing Pain Intervention(s): Limited activity within patient's tolerance, Monitored during session, Repositioned  Home Living                                          Prior Functioning/Environment              Frequency  Min 1X/week        Progress Toward Goals  OT Goals(current goals can now be found in the care plan section)  Progress towards OT goals: Progressing toward goals  Acute Rehab OT Goals Patient Stated Goal: home OT Goal Formulation: With patient Time For Goal  Achievement: 09/30/23 Potential to Achieve Goals: Good  Plan      Co-evaluation                 AM-PAC OT "6 Clicks" Daily Activity     Outcome Measure   Help from another person eating meals?: None Help from another person taking care of personal grooming?: A Little Help from another person toileting, which includes using toliet, bedpan, or urinal?: A Lot Help from another person bathing (including washing, rinsing, drying)?: A Little Help from another person to put on and taking off regular upper body clothing?: A Little Help from another person to put on and taking off regular lower body clothing?: A Lot 6 Click Score: 17    End of Session    OT Visit Diagnosis: Other abnormalities of gait and mobility (R26.89);Muscle weakness (generalized) (M62.81);History of falling (Z91.81)   Activity Tolerance Patient tolerated treatment well   Patient Left in bed;with call bell/phone within reach;with bed alarm set   Nurse Communication Mobility status;Patient requests pain meds        Time: [phone]  OT Time Calculation (min): 52 min  Charges: OT General Charges $OT Visit: 1 Visit OT Treatments $Self Care/Home Management : 38-52 mins  Barry Brunner, OT Acute Rehabilitation Services Office [phone]   Chancy Milroy 09/25/2023, 2:03 PM

## 2023-09-25 NOTE — Progress Notes (Signed)
[facility] Spiritual Care Note  Returned call from Jordan [name] so she could share and process updates on her hospital stay and recovery process. Provided empathic listening and emotional support. She continues to call as needed.   [address] [name], [place], [facility] Pager [phone] Voicemail [phone]

## 2023-09-25 NOTE — Progress Notes (Signed)
[facility]                                                                              Cha Lockette, is a 57 y.o. female, DOB - [birth year], RUE:[id number] Admit date - 09/13/2023    Outpatient Primary MD for the patient is Courtney Paris, NP  LOS - 12  days  Chief Complaint  Patient presents with   Fall       Brief summary   Patient is a 57 year old female with history of bipolar disorder, obesity, presented to ED after mechanical fall.  Patient injured her right lower leg and foot in the fall and was unable to bear weight or ambulate after the fall.  Pain was severe. She was found to have closed fracture of right tibia and fibula, displaced right first metatarsal fracture Status post surgery  Assessment & Plan    Principal Problem:   Closed fracture of right tibia and fibula Closed displaced fracture of the first metatarsal bone of the right foot -Orthopedics was consulted, patient underwent open reduction, internal fixation of the right first tarsometatarsal fracture/dislocation on 11/25 -Underwent closed treatment of right tibia fracture on 11/25 -Orthopedics following, recommended Eliquis 2.5 mg twice daily for 30 days for DVT prophylaxis -Continue vitamin D 1000 units daily, pain control, PT OT -No acute complaints, awaiting SNF  Active Problems: Chronic pain -Continue oxycodone as needed for chronic, moderate pain  Thrombocytopenia -Resolved    Morbid obesity with BMI of 40.0-44.9, adult (HCC) Estimated body mass index is 42.57 kg/m as calculated from the following:   Height as of this encounter: 5\' 8"  (1.727 m).   Weight as of this encounter: 127 kg.  Code Status: Full code DVT Prophylaxis:  enoxaparin (LOVENOX) injection 40 mg Start: 09/16/23 0800 SCDs Start: 09/15/23 1404 SCDs Start: 09/13/23 2304   Level of Care: Level of care: Med-Surg Family Communication: Updated patient Disposition Plan:      Remains inpatient appropriate:  Awaiting placement   Procedures: 11/25 -open reduction, internal fixation of the right first tarsometatarsal fracture/dislocation -Underwent closed treatment of right tibia fracture  Consultants:   Orthopedics  Antimicrobials:   Anti-infectives (From admission, onward)    Start     Dose/Rate Route Frequency Ordered Stop   09/15/23 1800  ceFAZolin (ANCEF) IVPB 2g/100 mL premix        2 g 200 mL/hr over 30 Minutes Intravenous Every 8 hours 09/15/23 1403 09/16/23 2054   09/15/23 1210  vancomycin (VANCOCIN) powder  Status:  Discontinued          As needed 09/15/23 1217 09/15/23 1223          Medications  cholecalciferol  1,000 Units Oral Daily   divalproex  250 mg Oral QHS   docusate sodium  100 mg Oral BID   enoxaparin (LOVENOX) injection  40 mg Subcutaneous Q24H   feeding supplement  237 mL Oral BID BM   multivitamin with minerals  1 tablet Oral Daily   venlafaxine XR  75 mg Oral Q breakfast      Subjective:   Leina Bas was seen and examined today.  No acute complaints,  awaiting SNF.  No acute issues overnight.  Pain controlled.   Objective:   Vitals:   09/24/23 1328 09/24/23 2156 09/25/23 0600 09/25/23 0822  BP: 115/66 112/62 135/64 (!) 146/64  Pulse: 68  83 86  Resp: 18 19 19 16   Temp: 98.5 F (36.9 C)  98.6 F (37 C) 98.3 F (36.8 C)  TempSrc: Oral  Oral Oral  SpO2: 98% 95% 96% 95%  Weight:      Height:        Intake/Output Summary (Last 24 hours) at 09/25/2023 1324 Last data filed at 09/25/2023 0600 Gross per 24 hour  Intake --  Output 900 ml  Net -900 ml     Wt Readings from Last 3 Encounters:  09/15/23 127 kg  09/05/23 127.4 kg  08/18/23 126.4 kg    Physical Exam General: Alert and oriented x 3, NAD Cardiovascular: S1 S2 clear, RRR.  Respiratory: CTAB, no wheezing Gastrointestinal: Soft, nontender, nondistended, NBS Ext: no pedal edema bilaterally, RLE dressing intact Neuro: no new deficits Psych: Normal affect    Data  Reviewed:  I have personally reviewed following labs    CBC Lab Results  Component Value Date   WBC 4.7 09/25/2023   RBC 3.71 (L) 09/25/2023   HGB 11.2 (L) 09/25/2023   HCT 35.0 (L) 09/25/2023   MCV 94.3 09/25/2023   MCH 30.2 09/25/2023   PLT 198 09/25/2023   MCHC 32.0 09/25/2023   RDW 13.7 09/25/2023   LYMPHSABS 0.9 09/13/2023   MONOABS 0.8 09/13/2023   EOSABS 0.0 09/13/2023   BASOSABS 0.0 09/13/2023     Last metabolic panel Lab Results  Component Value Date   NA 139 09/25/2023   K 4.3 09/25/2023   CL 105 09/25/2023   CO2 26 09/25/2023   BUN 19 09/25/2023   CREATININE 0.72 09/25/2023   GLUCOSE 92 09/25/2023   GFRNONAA >60 09/25/2023   GFRAA >60 05/03/2020   CALCIUM 8.3 (L) 09/25/2023   PHOS 4.6 08/22/2022   PROT 6.1 (L) 11/21/2022   ALBUMIN 3.3 (L) 11/21/2022   BILITOT 0.8 11/21/2022   ALKPHOS 93 11/21/2022   AST 17 11/21/2022   ALT 14 11/21/2022   ANIONGAP 8 09/25/2023    CBG (last 3)  No results for input(s): "GLUCAP" in the last 72 hours.    Coagulation Profile: No results for input(s): "INR", "PROTIME" in the last 168 hours.   Radiology Studies: I have personally reviewed the imaging studies  No results found.     Thad Ranger M.D. [facility] 09/25/2023, 1:24 PM  Available via Epic secure chat 7am-7pm After 7 pm, please refer to night coverage provider listed on amion.

## 2023-09-25 NOTE — Progress Notes (Signed)
Physical Therapy Treatment Patient Details Name: Jordan [name] MRN: [id number] DOB: May 28, 1966 Today's Date: 09/25/2023   History of Present Illness Pt is a 57 y/o female presenting on 11/23 after fall.  Found with R 1st tarsometatarsal fx, R tibia refracture and R fibula fx.  11/25 S/P ORIF 1st tarsometatarsal fx and closed treatment of R tibia fx. PMH includes: alcohol abuse, anemia, bipolar 1, CAP, sepsis, UTI, obesity.    PT Comments  Received pt semi-reclined in bed reporting having BM but not on bedpan. Pt actively having BM before therapist could place pt on bedpan and dependent for hygiene management via rolling in bed. Pt performed bed mobility with supervision using bed features and performed lateral scoot into recliner with CGA. Pt still unreceptive to cues for safety/technique and opting to do it her own way. Pt instructed in seated exercises (hip flexion and knee extension) to promote LE strength/ROM. Pt with questions regarding DABSC and encouraged pt to speak with OT. Current discharge plan remain appropriate. Acute PT to cont to follow.    If plan is discharge home, recommend the following: Two people to help with walking and/or transfers;A lot of help with bathing/dressing/bathroom;Assistance with cooking/housework;Direct supervision/assist for medications management;Assist for transportation;Help with stairs or ramp for entrance;Supervision due to cognitive status   Can travel by private vehicle     No  Equipment Recommendations  Other (comment) (TBD in next venue)    Recommendations for Other Services       Precautions / Restrictions Precautions Precautions: Fall Required Braces or Orthoses: Splint/Cast Splint/Cast: RLE short leg cast Splint/Cast - Date Prophylactic Dressing Applied (if applicable): 09/15/23 Restrictions Weight Bearing Restrictions: Yes RLE Weight Bearing: Non weight bearing     Mobility  Bed Mobility Overal bed mobility: Needs Assistance Bed  Mobility: Rolling, Supine to Sit Rolling: Supervision   Supine to sit: Supervision     General bed mobility comments: HOB elevated and heavy reliance on bedrails    Transfers Overall transfer level: Needs assistance Equipment used: None Transfers: Bed to chair/wheelchair/BSC            Lateral/Scoot Transfers: Contact guard assist General transfer comment: pt transferred bed<>recliner to L via lateral scoot. Pt did not follow cues for safety    Ambulation/Gait               General Gait Details: unable   Stairs             Wheelchair Mobility     Tilt Bed    Modified Rankin (Stroke Patients Only)       Balance Overall balance assessment: Needs assistance Sitting-balance support: No upper extremity supported, Feet supported Sitting balance-Leahy Scale: Fair Sitting balance - Comments: able to maintain static sitting balance with supervision                                    Cognition Arousal: Alert Behavior During Therapy: Anxious Overall Cognitive Status: No family/caregiver present to determine baseline cognitive functioning Area of Impairment: Memory, Following commands, Safety/judgement, Problem solving, Awareness, Attention                     Memory: Decreased short-term memory, Decreased recall of precautions Following Commands: Follows one step commands consistently, Follows one step commands with increased time, Follows multi-step commands inconsistently Safety/Judgement: Decreased awareness of deficits, Decreased awareness of safety   Problem Solving: Slow processing, Requires verbal  cues, Requires tactile cues, Difficulty sequencing General Comments: pt unable to follow cues for safety with tranfer technique, has "her own way" of doing it        Exercises      General Comments General comments (skin integrity, edema, etc.): notified RN of BM, and not to use DABSC in room with pt at this time       Pertinent Vitals/Pain Pain Assessment Pain Assessment: Faces Faces Pain Scale: Hurts little more Pain Location: R LE Pain Descriptors / Indicators: Discomfort, Grimacing Pain Intervention(s): Limited activity within patient's tolerance, Monitored during session, Repositioned    Home Living                          Prior Function            PT Goals (current goals can now be found in the care plan section) Acute Rehab PT Goals Patient Stated Goal: to go home PT Goal Formulation: With patient Time For Goal Achievement: 09/30/23 Potential to Achieve Goals: Fair Progress towards PT goals: Progressing toward goals    Frequency    Min 1X/week      PT Plan      Co-evaluation              AM-PAC PT "6 Clicks" Mobility   Outcome Measure  Help needed turning from your back to your side while in a flat bed without using bedrails?: A Little Help needed moving from lying on your back to sitting on the side of a flat bed without using bedrails?: A Little Help needed moving to and from a bed to a chair (including a wheelchair)?: A Little Help needed standing up from a chair using your arms (e.g., wheelchair or bedside chair)?: Total Help needed to walk in hospital room?: Total Help needed climbing 3-5 steps with a railing? : Total 6 Click Score: 12    End of Session Equipment Utilized During Treatment: Gait belt Activity Tolerance: Patient tolerated treatment well Patient left: with call bell/phone within reach;in chair;with chair alarm set Nurse Communication: Mobility status PT Visit Diagnosis: Other abnormalities of gait and mobility (R26.89);Muscle weakness (generalized) (M62.81);Pain Pain - Right/Left: Right Pain - part of body: Leg     Time: 1010-1034 PT Time Calculation (min) (ACUTE ONLY): 24 min  Charges:    $Therapeutic Activity: 23-37 mins PT General Charges $$ ACUTE PT VISIT: 1 Visit                     Blima Rich PT, DPT [facility]  [place] 09/25/2023, 2:47 PM

## 2023-09-26 DIAGNOSIS — S82201A Unspecified fracture of shaft of right tibia, initial encounter for closed fracture: Secondary | ICD-10-CM | POA: Diagnosis not present

## 2023-09-26 DIAGNOSIS — G8929 Other chronic pain: Secondary | ICD-10-CM | POA: Diagnosis not present

## 2023-09-26 DIAGNOSIS — S92311A Displaced fracture of first metatarsal bone, right foot, initial encounter for closed fracture: Secondary | ICD-10-CM | POA: Diagnosis not present

## 2023-09-26 NOTE — Progress Notes (Signed)
[facility]                                                                              Jordan [name], is a 57 y.o. female, DOB - 1966/02/22, UYQ:[id number] Admit date - 09/13/2023    Outpatient Primary MD for the patient is Courtney Paris, NP  LOS - 13  days  Chief Complaint  Patient presents with   Fall       Brief summary   Patient is a 57 year old female with history of bipolar disorder, obesity, presented to ED after mechanical fall.  Patient injured her right lower leg and foot in the fall and was unable to bear weight or ambulate after the fall.  Pain was severe. She was found to have closed fracture of right tibia and fibula, displaced right first metatarsal fracture Status post surgery  Assessment & Plan    Principal Problem:   Closed fracture of right tibia and fibula Closed displaced fracture of the first metatarsal bone of the right foot -Orthopedics was consulted, patient underwent open reduction, internal fixation of the right first tarsometatarsal fracture/dislocation on 11/25 -Underwent closed treatment of right tibia fracture on 11/25 -Orthopedics following, recommended Eliquis 2.5 mg twice daily for 30 days for DVT prophylaxis -Continue vitamin D 1000 units daily, pain control, PT OT -No acute issues, awaiting SNF  Active Problems: Chronic pain -Continue oxycodone as needed for chronic, moderate pain  Thrombocytopenia -Resolved    Morbid obesity with BMI of 40.0-44.9, adult (HCC) Estimated body mass index is 42.57 kg/m as calculated from the following:   Height as of this encounter: 5\' 8"  (1.727 m).   Weight as of this encounter: 127 kg.  Code Status: Full code DVT Prophylaxis:  enoxaparin (LOVENOX) injection 40 mg Start: 09/16/23 0800 SCDs Start: 09/15/23 1404 SCDs Start: 09/13/23 2304   Level of Care: Level of care: Med-Surg Family Communication: Updated patient Disposition Plan:      Remains inpatient appropriate: Awaiting  insurance authorization for placement   Procedures: 11/25 -open reduction, internal fixation of the right first tarsometatarsal fracture/dislocation -Underwent closed treatment of right tibia fracture  Consultants:   Orthopedics  Antimicrobials:   Anti-infectives (From admission, onward)    Start     Dose/Rate Route Frequency Ordered Stop   09/15/23 1800  ceFAZolin (ANCEF) IVPB 2g/100 mL premix        2 g 200 mL/hr over 30 Minutes Intravenous Every 8 hours 09/15/23 1403 09/16/23 2054   09/15/23 1210  vancomycin (VANCOCIN) powder  Status:  Discontinued          As needed 09/15/23 1217 09/15/23 1223          Medications  cholecalciferol  1,000 Units Oral Daily   divalproex  250 mg Oral QHS   docusate sodium  100 mg Oral BID   enoxaparin (LOVENOX) injection  40 mg Subcutaneous Q24H   feeding supplement  237 mL Oral BID BM   multivitamin with minerals  1 tablet Oral Daily   venlafaxine XR  75 mg Oral Q breakfast      Subjective:   Jordan [name] was seen and examined today.  No acute complaints.  Awaiting SNF.  Pain controlled.    Objective:   Vitals:   09/25/23 2053 09/26/23 0447 09/26/23 0848 09/26/23 1459  BP: 128/66 131/63 (!) 133/59 108/66  Pulse: 81 78 86   Resp: 17 16 18 18   Temp: 98 F (36.7 C) 98 F (36.7 C) 98.4 F (36.9 C) 98.4 F (36.9 C)  TempSrc: Oral Oral Oral Oral  SpO2: 95% 97% 94% 95%  Weight:      Height:        Intake/Output Summary (Last 24 hours) at 09/26/2023 1557 Last data filed at 09/26/2023 0851 Gross per 24 hour  Intake 240 ml  Output 2950 ml  Net -2710 ml     Wt Readings from Last 3 Encounters:  09/15/23 127 kg  09/05/23 127.4 kg  08/18/23 126.4 kg   Physical Exam General: Alert and oriented x 3, NAD Cardiovascular: S1 S2 clear, RRR.  Respiratory: CTAB Gastrointestinal: Soft, nontender, nondistended, NBS Ext:  RLE dressing intact Neuro: no new deficits Psych: Normal affect    Data Reviewed:  I have personally  reviewed following labs    CBC Lab Results  Component Value Date   WBC 4.7 09/25/2023   RBC 3.71 (L) 09/25/2023   HGB 11.2 (L) 09/25/2023   HCT 35.0 (L) 09/25/2023   MCV 94.3 09/25/2023   MCH 30.2 09/25/2023   PLT 198 09/25/2023   MCHC 32.0 09/25/2023   RDW 13.7 09/25/2023   LYMPHSABS 0.9 09/13/2023   MONOABS 0.8 09/13/2023   EOSABS 0.0 09/13/2023   BASOSABS 0.0 09/13/2023     Last metabolic panel Lab Results  Component Value Date   NA 139 09/25/2023   K 4.3 09/25/2023   CL 105 09/25/2023   CO2 26 09/25/2023   BUN 19 09/25/2023   CREATININE 0.72 09/25/2023   GLUCOSE 92 09/25/2023   GFRNONAA >60 09/25/2023   GFRAA >60 05/03/2020   CALCIUM 8.3 (L) 09/25/2023   PHOS 4.6 08/22/2022   PROT 6.1 (L) 11/21/2022   ALBUMIN 3.3 (L) 11/21/2022   BILITOT 0.8 11/21/2022   ALKPHOS 93 11/21/2022   AST 17 11/21/2022   ALT 14 11/21/2022   ANIONGAP 8 09/25/2023    CBG (last 3)  No results for input(s): "GLUCAP" in the last 72 hours.    Coagulation Profile: No results for input(s): "INR", "PROTIME" in the last 168 hours.   Radiology Studies: I have personally reviewed the imaging studies  No results found.     Thad Ranger M.D. [facility] 09/26/2023, 3:57 PM  Available via Epic secure chat 7am-7pm After 7 pm, please refer to night coverage provider listed on amion.

## 2023-09-26 NOTE — Progress Notes (Signed)
Occupational Therapy Treatment Patient Details Name: Jordan [name] MRN: [id number] DOB: 10-01-1966 Today's Date: 09/26/2023   History of present illness Pt is a 57 y/o female presenting on 11/23 after fall.  Found with R 1st tarsometatarsal fx, R tibia refracture and R fibula fx.  11/25 S/P ORIF 1st tarsometatarsal fx and closed treatment of R tibia fx. PMH includes: alcohol abuse, anemia, bipolar 1, CAP, sepsis, UTI, obesity.   OT comments  Patient seen with mobility specialist Maurene Capes due to patient wanting to attempt standing from EOB. Patient able to get to EOB with supervision and states she needed to use BSC. Patient performed transfer to drop arm BSC with min/mod assist due to difficulty sliding on BSC surface and difficulty retuning to EOB due to [facility] is lower surface. Patient able to perform toilet hygiene front and required assistance for back. Patient performed 2 stands from EOB with RW requiring mod assist +2 and tolerating 30-45 seconds of standing. Patient will benefit from continued inpatient follow up therapy, <3 hours/day for further OT services to address toilet transfers, bathing, and dressing. Acute OT to continue to follow.       If plan is discharge home, recommend the following:  A lot of help with bathing/dressing/bathroom;Assistance with cooking/housework;Assist for transportation;Help with stairs or ramp for entrance;A little help with walking and/or transfers   Equipment Recommendations  Wheelchair (measurements OT);Wheelchair cushion (measurements OT);BSC/3in1;Hospital bed (drop arm BSC)    Recommendations for Other Services      Precautions / Restrictions Precautions Precautions: Fall Required Braces or Orthoses: Splint/Cast Splint/Cast: RLE short leg cast Splint/Cast - Date Prophylactic Dressing Applied (if applicable): 09/15/23 Restrictions Weight Bearing Restrictions: Yes RLE Weight Bearing: Non weight bearing       Mobility Bed Mobility Overal bed  mobility: Needs Assistance Bed Mobility: Rolling, Supine to Sit, Sit to Supine Rolling: Supervision   Supine to sit: Supervision Sit to supine: Supervision   General bed mobility comments: cues not to WB through RLE during bed mobility    Transfers Overall transfer level: Needs assistance Equipment used: Rolling walker (2 wheels), None Transfers: Sit to/from Stand, Bed to chair/wheelchair/BSC Sit to Stand: Mod assist, +2 physical assistance, From elevated surface          Lateral/Scoot Transfers: Min assist, Mod assist General transfer comment: performed transfer to/from drop arm BSC with min/mod assist due to patient with difficulty sliding on BSC and returning to EOB  from lower surface     Balance Overall balance assessment: Needs assistance Sitting-balance support: No upper extremity supported, Feet supported Sitting balance-Leahy Scale: Fair     Standing balance support: Bilateral upper extremity supported, During functional activity Standing balance-Leahy Scale: Poor Standing balance comment: Stood with mod assist of 2 and patient able to maintain NWB on RW tolerating 30-45 seconds. Heavy reliance on RW                           ADL either performed or assessed with clinical judgement   ADL Overall ADL's : Needs assistance/impaired                         Toilet Transfer: Minimal assistance;Moderate assistance;BSC/3in1 Statistician Details (indicate cue type and reason): lateral scoot to drop arm bsc Toileting- Clothing Manipulation and Hygiene: Moderate assistance;Sitting/lateral lean Toileting - Clothing Manipulation Details (indicate cue type and reason): pt able to complete anterior peri care, but requires total assist for  posterior peri care       General ADL Comments: difficulty transfer back to EOB due to lower surface    Extremity/Trunk Assessment              Vision       Perception     Praxis      Cognition  Arousal: Alert Behavior During Therapy: Anxious Overall Cognitive Status: No family/caregiver present to determine baseline cognitive functioning Area of Impairment: Memory, Following commands, Safety/judgement, Problem solving, Awareness, Attention                   Current Attention Level: Sustained Memory: Decreased short-term memory, Decreased recall of precautions Following Commands: Follows one step commands consistently, Follows one step commands with increased time, Follows multi-step commands inconsistently Safety/Judgement: Decreased awareness of deficits, Decreased awareness of safety Awareness: Emergent Problem Solving: Slow processing, Requires verbal cues, Requires tactile cues, Difficulty sequencing General Comments: frequent cues for safety with transfers and standing        Exercises      Shoulder Instructions       General Comments      Pertinent Vitals/ Pain       Pain Assessment Pain Assessment: Faces Faces Pain Scale: Hurts little more Pain Location: R LE Pain Descriptors / Indicators: Discomfort, Grimacing Pain Intervention(s): Limited activity within patient's tolerance, Monitored during session, Repositioned  Home Living                                          Prior Functioning/Environment              Frequency  Min 1X/week        Progress Toward Goals  OT Goals(current goals can now be found in the care plan section)  Progress towards OT goals: Progressing toward goals  Acute Rehab OT Goals Patient Stated Goal: get stronger OT Goal Formulation: With patient Time For Goal Achievement: 09/30/23 Potential to Achieve Goals: Good ADL Goals Pt Will Perform Grooming: with modified independence;sitting Pt Will Perform Lower Body Dressing: with min assist;sitting/lateral leans;with adaptive equipment Pt Will Transfer to Toilet: with transfer board;bedside commode;with supervision Pt Will Perform Toileting -  Clothing Manipulation and hygiene: sitting/lateral leans;with supervision Additional ADL Goal #1: Pt will stand with 1-2 hand support with min assist while maintaining NWB to R LE for 1 minute in prep for ADL performance.  Plan      Co-evaluation                 AM-PAC OT "6 Clicks" Daily Activity     Outcome Measure   Help from another person eating meals?: None Help from another person taking care of personal grooming?: A Little Help from another person toileting, which includes using toliet, bedpan, or urinal?: A Lot Help from another person bathing (including washing, rinsing, drying)?: A Little Help from another person to put on and taking off regular upper body clothing?: A Little Help from another person to put on and taking off regular lower body clothing?: A Lot 6 Click Score: 17    End of Session Equipment Utilized During Treatment: Gait belt;Rolling walker (2 wheels)  OT Visit Diagnosis: Other abnormalities of gait and mobility (R26.89);Muscle weakness (generalized) (M62.81);History of falling (Z91.81)   Activity Tolerance Patient tolerated treatment well   Patient Left in bed;with call bell/phone within reach;with bed alarm set  Nurse Communication Mobility status;Patient requests pain meds        Time: 6295-2841 OT Time Calculation (min): 30 min  Charges: OT General Charges $OT Visit: 1 Visit OT Treatments $Self Care/Home Management : 8-22 mins $Therapeutic Activity: 8-22 mins  Alfonse Flavors, OTA Acute Rehabilitation Services  Office [phone]   Dewain Penning 09/26/2023, 2:47 PM

## 2023-09-26 NOTE — Progress Notes (Signed)
Mobility Specialist: Progress Note   09/26/23 1208  Mobility  Activity Dangled on edge of bed;Turned to left side;Turned to right side  Range of Motion/Exercises Active Assistive;Active;Right leg;Left leg  RLE Weight Bearing NWB  Activity Response Tolerated well  Mobility Referral Yes  Mobility visit 1 Mobility  Mobility Specialist Start Time (ACUTE ONLY) [id number]  Mobility Specialist Stop Time (ACUTE ONLY) 1017  Mobility Specialist Time Calculation (min) (ACUTE ONLY) 30 min    Pt was agreeable to mobility session - received in bed. Performed bed level and EOB exercises. Completed leg lifts, lying marches, and L ankle pumps. Then sat EOB and completed seated marches and knee kicks. Assisted with hygiene with NT as well. Left in bed with all needs met, call bell in reach.   Maurene Capes Mobility Specialist Please contact via SecureChat or Rehab office at [phone]

## 2023-09-26 NOTE — TOC Progression Note (Addendum)
Transition of Care [facility]) - Progression Note    Patient Details  Name: Jordan [name] MRN: [id number] Date of Birth: [birth year]  [facility] CM/SW Contact  Lorri Frederick, LCSW Phone Number: 09/26/2023, 11:05 AM  Clinical Narrative:   [facility].  They are calling insurance to check on auth, will call again today.  They are only told the request remains pending.    1410: Message from [place]. She was able to speak with [facility], they are going to expedite request.  Anticipate response today or tomorrow.  Expected Discharge Plan: Skilled Nursing Facility Barriers to Discharge: Insurance Authorization  Expected Discharge Plan and Services In-house Referral: Clinical Social Work   Post Acute Care Choice: IP Rehab Living arrangements for the past 2 months: Single Family Home                                       Social Determinants of Health (SDOH) Interventions SDOH Screenings   Food Insecurity: No Food Insecurity (09/14/2023)  Recent Concern: Food Insecurity - Food Insecurity Present (07/16/2023)  Housing: Low Risk  (09/14/2023)  Transportation Needs: No Transportation Needs (09/14/2023)  Recent Concern: Transportation Needs - Unmet Transportation Needs (07/16/2023)  Utilities: Not At Risk (09/14/2023)  Depression (PHQ2-9): Low Risk  (07/16/2023)  Tobacco Use: Low Risk  (09/15/2023)    Readmission Risk Interventions    08/22/2022    3:08 PM 08/19/2022   11:26 AM  Readmission Risk Prevention Plan  Post Dischage Appt  Complete  Medication Screening  Complete  Transportation Screening Complete Complete  PCP or Specialist Appt within 5-7 Days Complete   Home Care Screening Complete   Medication Review (RN CM) Complete

## 2023-09-26 NOTE — Plan of Care (Signed)
  Problem: Education: Goal: Knowledge of General Education information will improve Description: Including pain rating scale, medication(s)/side effects and non-pharmacologic comfort measures Outcome: Progressing   Problem: Clinical Measurements: Goal: Ability to maintain clinical measurements within normal limits will improve Outcome: Progressing Goal: Will remain free from infection Outcome: Progressing   Problem: Activity: Goal: Risk for activity intolerance will decrease Outcome: Progressing   Problem: Nutrition: Goal: Adequate nutrition will be maintained Outcome: Progressing   Problem: Coping: Goal: Level of anxiety will decrease Outcome: Progressing   Problem: Elimination: Goal: Will not experience complications related to bowel motility Outcome: Progressing

## 2023-09-27 DIAGNOSIS — G8929 Other chronic pain: Secondary | ICD-10-CM | POA: Diagnosis not present

## 2023-09-27 DIAGNOSIS — S82201A Unspecified fracture of shaft of right tibia, initial encounter for closed fracture: Secondary | ICD-10-CM | POA: Diagnosis not present

## 2023-09-27 DIAGNOSIS — S92311A Displaced fracture of first metatarsal bone, right foot, initial encounter for closed fracture: Secondary | ICD-10-CM | POA: Diagnosis not present

## 2023-09-27 MED ORDER — LOPERAMIDE HCL 2 MG PO CAPS
2.0000 mg | ORAL_CAPSULE | Freq: Once | ORAL | Status: AC
  Administered 2023-09-27: 2 mg via ORAL
  Filled 2023-09-27: qty 1

## 2023-09-27 NOTE — Plan of Care (Signed)
  Problem: Clinical Measurements: Goal: Will remain free from infection 09/27/2023 0753 by Beverely Low, RN Outcome: Progressing 09/27/2023 0753 by Beverely Low, RN Outcome: Progressing   Problem: Safety: Goal: Ability to remain free from injury will improve 09/27/2023 0753 by Beverely Low, RN Outcome: Progressing 09/27/2023 0753 by Beverely Low, RN Outcome: Progressing   Problem: Skin Integrity: Goal: Risk for impaired skin integrity will decrease 09/27/2023 0753 by Beverely Low, RN Outcome: Progressing 09/27/2023 0753 by Beverely Low, RN Outcome: Progressing

## 2023-09-27 NOTE — Plan of Care (Signed)
  Problem: Education: Goal: Knowledge of General Education information will improve Description: Including pain rating scale, medication(s)/side effects and non-pharmacologic comfort measures Outcome: Progressing   Problem: Clinical Measurements: Goal: Cardiovascular complication will be avoided Outcome: Progressing   Problem: Activity: Goal: Risk for activity intolerance will decrease Outcome: Progressing   Problem: Nutrition: Goal: Adequate nutrition will be maintained Outcome: Progressing   Problem: Coping: Goal: Level of anxiety will decrease Outcome: Progressing   Problem: Elimination: Goal: Will not experience complications related to bowel motility Outcome: Progressing   Problem: Pain Management: Goal: General experience of comfort will improve Outcome: Progressing

## 2023-09-27 NOTE — Progress Notes (Signed)
Mobility Specialist: Progress Note   09/27/23 1255  Mobility  Activity Dangled on edge of bed  Level of Assistance Standby assist, set-up cues, supervision of patient - no hands on  Range of Motion/Exercises Active Assistive;Active;Right leg;Left leg  RLE Weight Bearing NWB  Activity Response Tolerated well  Mobility Referral Yes  Mobility visit 1 Mobility  Mobility Specialist Start Time (ACUTE ONLY) [id number]  Mobility Specialist Stop Time (ACUTE ONLY) 0932  Mobility Specialist Time Calculation (min) (ACUTE ONLY) 27 min    Pt was agreeable to mobility session - received in bed. SV for bed mobility. Performed seated marches, knee kicks, and L ankle pumps on EOB. Had a BM in bed - Nt and MS assisted with peri care. Left in bed with all needs met, call bell in reach. Agreeable for MS to return later so she could transfer to chair.   MS returned around 1215 so pt could lateral scoot to chair - received in bed. SV throughout attempt. Having issues and needing extra time for lateral scoot - not following advice given by MS for a smoother transfer. After a few attempts pt had a BM in bed - further mobility was deferred. Pt returned to bed and MS assisted with pericare. Left in bed with all needs met, call bell in reach. NT notified.   Maurene Capes Mobility Specialist Please contact via SecureChat or Rehab office at [phone]

## 2023-09-27 NOTE — Progress Notes (Signed)
[facility]                                                                              Jordan [name], is a 57 y.o. female, DOB - 09-May-1966, ONG:[id number] Admit date - 09/13/2023    Outpatient Primary MD for the patient is Courtney Paris, NP  LOS - 14  days  Chief Complaint  Patient presents with   Fall       Brief summary   Patient is a 57 year old female with history of bipolar disorder, obesity, presented to ED after mechanical fall.  Patient injured her right lower leg and foot in the fall and was unable to bear weight or ambulate after the fall.  Pain was severe. She was found to have closed fracture of right tibia and fibula, displaced right first metatarsal fracture Status post surgery  Assessment & Plan    Principal Problem:   Closed fracture of right tibia and fibula Closed displaced fracture of the first metatarsal bone of the right foot -Orthopedics was consulted, patient underwent open reduction, internal fixation of the right first tarsometatarsal fracture/dislocation on 11/25 -Underwent closed treatment of right tibia fracture on 11/25 -Orthopedics following, recommended Eliquis 2.5 mg twice daily for 30 days for DVT prophylaxis -Continue vitamin D 1000 units daily, pain control, PT OT -No acute issues, working with PT  Active Problems: Chronic pain -Continue oxycodone as needed for chronic, moderate pain  Thrombocytopenia -Resolved  Diarrhea -Will DC Colace, MiraLAX - loperamide, x 1    Morbid obesity with BMI of 40.0-44.9, adult (HCC) Estimated body mass index is 42.57 kg/m as calculated from the following:   Height as of this encounter: 5\' 8"  (1.727 m).   Weight as of this encounter: 127 kg.  Code Status: Full code DVT Prophylaxis:  enoxaparin (LOVENOX) injection 40 mg Start: 09/16/23 0800 SCDs Start: 09/15/23 1404 SCDs Start: 09/13/23 2304   Level of Care: Level of care: Med-Surg Family Communication: Updated  patient Disposition Plan:      Remains inpatient appropriate: Awaiting insurance authorization for placement   Procedures: 11/25 -open reduction, internal fixation of the right first tarsometatarsal fracture/dislocation -Underwent closed treatment of right tibia fracture  Consultants:   Orthopedics  Antimicrobials:   Anti-infectives (From admission, onward)    Start     Dose/Rate Route Frequency Ordered Stop   09/15/23 1800  ceFAZolin (ANCEF) IVPB 2g/100 mL premix        2 g 200 mL/hr over 30 Minutes Intravenous Every 8 hours 09/15/23 1403 09/16/23 2054   09/15/23 1210  vancomycin (VANCOCIN) powder  Status:  Discontinued          As needed 09/15/23 1217 09/15/23 1223          Medications  cholecalciferol  1,000 Units Oral Daily   divalproex  250 mg Oral QHS   docusate sodium  100 mg Oral BID   enoxaparin (LOVENOX) injection  40 mg Subcutaneous Q24H   feeding supplement  237 mL Oral BID BM   multivitamin with minerals  1 tablet Oral Daily   venlafaxine XR  75 mg Oral Q breakfast  Subjective:   Florabelle Crump was seen and examined today.  Per patient, had 3 BMs yesterday.  Pain controlled, awaiting SNF.  Working with mobility specialist.   Objective:   Vitals:   09/26/23 0848 09/26/23 1459 09/26/23 2011 09/27/23 0534  BP: (!) 133/59 108/66 (!) 106/59 (!) 141/60  Pulse: 86  97 89  Resp: 18 18 18 18   Temp: 98.4 F (36.9 C) 98.4 F (36.9 C) 98.2 F (36.8 C) 98 F (36.7 C)  TempSrc: Oral Oral Oral Oral  SpO2: 94% 95% 96% 97%  Weight:      Height:        Intake/Output Summary (Last 24 hours) at 09/27/2023 1237 Last data filed at 09/27/2023 0539 Gross per 24 hour  Intake --  Output 1600 ml  Net -1600 ml     Wt Readings from Last 3 Encounters:  09/15/23 127 kg  09/05/23 127.4 kg  08/18/23 126.4 kg    Physical Exam General: Alert and oriented x 3, NAD Cardiovascular: S1 S2 clear, RRR.  Respiratory: CTAB Gastrointestinal: Soft, NT, ND  NBS Ext:  RLE dressing intact Neuro: no new deficits Psych: Normal affect    Data Reviewed:  I have personally reviewed following labs    CBC Lab Results  Component Value Date   WBC 4.7 09/25/2023   RBC 3.71 (L) 09/25/2023   HGB 11.2 (L) 09/25/2023   HCT 35.0 (L) 09/25/2023   MCV 94.3 09/25/2023   MCH 30.2 09/25/2023   PLT 198 09/25/2023   MCHC 32.0 09/25/2023   RDW 13.7 09/25/2023   LYMPHSABS 0.9 09/13/2023   MONOABS 0.8 09/13/2023   EOSABS 0.0 09/13/2023   BASOSABS 0.0 09/13/2023     Last metabolic panel Lab Results  Component Value Date   NA 139 09/25/2023   K 4.3 09/25/2023   CL 105 09/25/2023   CO2 26 09/25/2023   BUN 19 09/25/2023   CREATININE 0.72 09/25/2023   GLUCOSE 92 09/25/2023   GFRNONAA >60 09/25/2023   GFRAA >60 05/03/2020   CALCIUM 8.3 (L) 09/25/2023   PHOS 4.6 08/22/2022   PROT 6.1 (L) 11/21/2022   ALBUMIN 3.3 (L) 11/21/2022   BILITOT 0.8 11/21/2022   ALKPHOS 93 11/21/2022   AST 17 11/21/2022   ALT 14 11/21/2022   ANIONGAP 8 09/25/2023    CBG (last 3)  No results for input(s): "GLUCAP" in the last 72 hours.    Coagulation Profile: No results for input(s): "INR", "PROTIME" in the last 168 hours.   Radiology Studies: I have personally reviewed the imaging studies  No results found.     Thad Ranger M.D. [facility] 09/27/2023, 12:37 PM  Available via Epic secure chat 7am-7pm After 7 pm, please refer to night coverage provider listed on amion.

## 2023-09-28 DIAGNOSIS — S82201A Unspecified fracture of shaft of right tibia, initial encounter for closed fracture: Secondary | ICD-10-CM | POA: Diagnosis not present

## 2023-09-28 DIAGNOSIS — S92311A Displaced fracture of first metatarsal bone, right foot, initial encounter for closed fracture: Secondary | ICD-10-CM | POA: Diagnosis not present

## 2023-09-28 DIAGNOSIS — G8929 Other chronic pain: Secondary | ICD-10-CM | POA: Diagnosis not present

## 2023-09-28 LAB — BASIC METABOLIC PANEL
Anion gap: 8 (ref 5–15)
BUN: 21 mg/dL — ABNORMAL HIGH (ref 6–20)
CO2: 26 mmol/L (ref 22–32)
Calcium: 8.7 mg/dL — ABNORMAL LOW (ref 8.9–10.3)
Chloride: 105 mmol/L (ref 98–111)
Creatinine, Ser: 0.8 mg/dL (ref 0.44–1.00)
GFR, Estimated: >60 mL/min (ref >60)
Glucose, Bld: 94 mg/dL (ref 70–99)
Potassium: 4.3 mmol/L (ref 3.5–5.1)
Sodium: 139 mmol/L (ref 135–145)

## 2023-09-28 LAB — CBC
HCT: 38.6 % (ref 36.0–46.0)
Hemoglobin: 12.8 g/dL (ref 12.0–15.0)
MCH: 31.8 pg (ref 26.0–34.0)
MCHC: 33.2 g/dL (ref 30.0–36.0)
MCV: 96 fL (ref 80.0–100.0)
Platelets: 200 10*3/uL (ref 150–400)
RBC: 4.02 MIL/uL (ref 3.87–5.11)
RDW: 14 % (ref 11.5–15.5)
WBC: 5.8 10*3/uL (ref 4.0–10.5)
nRBC: 0 % (ref 0.0–0.2)

## 2023-09-28 NOTE — Progress Notes (Signed)
[facility]                                                                              Hettie Gallmeyer, is a 57 y.o. female, DOB - 04/05/1966, EAV:[id number] Admit date - 09/13/2023    Outpatient Primary MD for the patient is Courtney Paris, NP  LOS - 15  days  Chief Complaint  Patient presents with   Fall       Brief summary   Patient is a 57 year old female with history of bipolar disorder, obesity, presented to ED after mechanical fall.  Patient injured her right lower leg and foot in the fall and was unable to bear weight or ambulate after the fall.  Pain was severe. She was found to have closed fracture of right tibia and fibula, displaced right first metatarsal fracture Status post surgery  Assessment & Plan    Principal Problem:   Closed fracture of right tibia and fibula Closed displaced fracture of the first metatarsal bone of the right foot -Orthopedics was consulted, patient underwent open reduction, internal fixation of the right first tarsometatarsal fracture/dislocation on 11/25 -Underwent closed treatment of right tibia fracture on 11/25 -Orthopedics following, recommended Eliquis 2.5 mg twice daily for 30 days for DVT prophylaxis -Continue vitamin D 1000 units daily, pain control, PT OT -No acute issues, working with PT -Labs reviewed  Active Problems: Chronic pain -Continue oxycodone as needed for chronic, moderate pain  Thrombocytopenia -Resolved  Diarrhea -Resolved with loperamide x 1    Morbid obesity with BMI of 40.0-44.9, adult (HCC) Estimated body mass index is 42.57 kg/m as calculated from the following:   Height as of this encounter: 5\' 8"  (1.727 m).   Weight as of this encounter: 127 kg.  Code Status: Full code DVT Prophylaxis:  enoxaparin (LOVENOX) injection 40 mg Start: 09/16/23 0800 SCDs Start: 09/15/23 1404 SCDs Start: 09/13/23 2304   Level of Care: Level of care: Med-Surg Family Communication: Updated  patient Disposition Plan:      Remains inpatient appropriate: Awaiting insurance authorization for placement   Procedures: 11/25 -open reduction, internal fixation of the right first tarsometatarsal fracture/dislocation -Underwent closed treatment of right tibia fracture  Consultants:   Orthopedics  Antimicrobials:   Anti-infectives (From admission, onward)    Start     Dose/Rate Route Frequency Ordered Stop   09/15/23 1800  ceFAZolin (ANCEF) IVPB 2g/100 mL premix        2 g 200 mL/hr over 30 Minutes Intravenous Every 8 hours 09/15/23 1403 09/16/23 2054   09/15/23 1210  vancomycin (VANCOCIN) powder  Status:  Discontinued          As needed 09/15/23 1217 09/15/23 1223          Medications  cholecalciferol  1,000 Units Oral Daily   divalproex  250 mg Oral QHS   enoxaparin (LOVENOX) injection  40 mg Subcutaneous Q24H   feeding supplement  237 mL Oral BID BM   multivitamin with minerals  1 tablet Oral Daily   venlafaxine XR  75 mg Oral Q breakfast      Subjective:   Irine Chambliss was seen and examined today.  States diarrhea resolved.  Has been working with PT.  Pain controlled.   Objective:   Vitals:   09/27/23 1449 09/27/23 2023 09/28/23 0454 09/28/23 0710  BP: 117/66 119/63 114/61 (!) 109/56  Pulse: 96 92 78 79  Resp: 18 18 18 18   Temp: 98.5 F (36.9 C) 97.9 F (36.6 C) 97.7 F (36.5 C) 97.6 F (36.4 C)  TempSrc: Oral Oral Oral Oral  SpO2: 97% 95% 94% 96%  Weight:      Height:        Intake/Output Summary (Last 24 hours) at 09/28/2023 1302 Last data filed at 09/28/2023 0900 Gross per 24 hour  Intake 240 ml  Output 2400 ml  Net -2160 ml     Wt Readings from Last 3 Encounters:  09/15/23 127 kg  09/05/23 127.4 kg  08/18/23 126.4 kg   Physical Exam General: Alert and oriented x 3, NAD Cardiovascular: S1 S2 clear, RRR.  Respiratory: CTAB, no wheezing Gastrointestinal: Soft, nontender, nondistended, NBS Ext: RLE dressing intact, LLE no  edema Skin: Right lower extremity dressing intact Psych: Normal affect   Data Reviewed:  I have personally reviewed following labs    CBC Lab Results  Component Value Date   WBC 5.8 09/28/2023   RBC 4.02 09/28/2023   HGB 12.8 09/28/2023   HCT 38.6 09/28/2023   MCV 96.0 09/28/2023   MCH 31.8 09/28/2023   PLT 200 09/28/2023   MCHC 33.2 09/28/2023   RDW 14.0 09/28/2023   LYMPHSABS 0.9 09/13/2023   MONOABS 0.8 09/13/2023   EOSABS 0.0 09/13/2023   BASOSABS 0.0 09/13/2023     Last metabolic panel Lab Results  Component Value Date   NA 139 09/28/2023   K 4.3 09/28/2023   CL 105 09/28/2023   CO2 26 09/28/2023   BUN 21 (H) 09/28/2023   CREATININE 0.80 09/28/2023   GLUCOSE 94 09/28/2023   GFRNONAA >60 09/28/2023   GFRAA >60 05/03/2020   CALCIUM 8.7 (L) 09/28/2023   PHOS 4.6 08/22/2022   PROT 6.1 (L) 11/21/2022   ALBUMIN 3.3 (L) 11/21/2022   BILITOT 0.8 11/21/2022   ALKPHOS 93 11/21/2022   AST 17 11/21/2022   ALT 14 11/21/2022   ANIONGAP 8 09/28/2023    CBG (last 3)  No results for input(s): "GLUCAP" in the last 72 hours.    Coagulation Profile: No results for input(s): "INR", "PROTIME" in the last 168 hours.   Radiology Studies: I have personally reviewed the imaging studies  No results found.     Thad Ranger M.D. [facility] 09/28/2023, 1:02 PM  Available via Epic secure chat 7am-7pm After 7 pm, please refer to night coverage provider listed on amion.

## 2023-09-28 NOTE — Plan of Care (Signed)
  Problem: Clinical Measurements: Goal: Will remain free from infection Outcome: Not Progressing   Problem: Coping: Goal: Level of anxiety will decrease Outcome: Not Progressing   Problem: Pain Management: Goal: General experience of comfort will improve Outcome: Not Progressing   Problem: Safety: Goal: Ability to remain free from injury will improve Outcome: Not Progressing   Problem: Skin Integrity: Goal: Risk for impaired skin integrity will decrease Outcome: Not Progressing

## 2023-09-29 ENCOUNTER — Encounter: Payer: Self-pay | Admitting: General Practice

## 2023-09-29 ENCOUNTER — Inpatient Hospital Stay (HOSPITAL_COMMUNITY): Payer: MEDICAID

## 2023-09-29 DIAGNOSIS — S82201A Unspecified fracture of shaft of right tibia, initial encounter for closed fracture: Secondary | ICD-10-CM | POA: Diagnosis not present

## 2023-09-29 DIAGNOSIS — G8929 Other chronic pain: Secondary | ICD-10-CM | POA: Diagnosis not present

## 2023-09-29 DIAGNOSIS — S92311A Displaced fracture of first metatarsal bone, right foot, initial encounter for closed fracture: Secondary | ICD-10-CM | POA: Diagnosis not present

## 2023-09-29 LAB — CREATININE, SERUM
Creatinine, Ser: 0.84 mg/dL (ref 0.44–1.00)
GFR, Estimated: >60 mL/min (ref >60)

## 2023-09-29 MED ORDER — LOPERAMIDE HCL 2 MG PO CAPS: 2.0000 mg | ORAL_CAPSULE | Freq: Three times a day (TID) | ORAL | Status: AC | PRN

## 2023-09-29 MED ORDER — ADULT MULTIVITAMIN W/MINERALS CH: 1.0000 | ORAL_TABLET | Freq: Every day | ORAL | Status: AC

## 2023-09-29 MED ORDER — LOPERAMIDE HCL 2 MG PO CAPS: 2.0000 mg | ORAL_CAPSULE | Freq: Three times a day (TID) | ORAL | Status: DC | PRN

## 2023-09-29 NOTE — Discharge Summary (Signed)
Physician Discharge Summary   Patient: Jordan [name] MRN: [id number] DOB: December 16, 1965  Admit date:     09/13/2023  Discharge date: 09/29/23  Discharge Physician: Thad Ranger, MD    PCP: Courtney Paris, NP   Recommendations at discharge:   Per orthopedics, continue Eliquis 2.5 mg twice daily for 30 days for DVT prophylaxis Continue PT OT and outpatient follow-up with Dr Jena Gauss in 2 weeks  Incisional and dressing care: Dressings left intact until follow-up  Showering: Okay to shower, keep splint dry Orthopedic device(s): Splint RLE Follow - up plan: 2 weeks after discharge for wound check, splint removal, repeat x-rays   Discharge Diagnoses:    Closed fracture of right tibia and fibula   Closed displaced fracture of first metatarsal bone of right foot   Morbid obesity with BMI of 40.0-44.9, adult (HCC)   Chronic pain    Thrombocytopenia resolved     Hospital Course:  Patient is a 57 year old female with history of bipolar disorder, obesity, presented to ED after mechanical fall.  Patient injured her right lower leg and foot in the fall and was unable to bear weight or ambulate after the fall.  Pain was severe. She was found to have closed fracture of right tibia and fibula, displaced right first metatarsal fracture Status post surgery    Assessment and Plan:  Closed fracture of right tibia and fibula Closed displaced fracture of the first metatarsal bone of the right foot -Orthopedics was consulted, patient underwent open reduction, internal fixation of the right first tarsometatarsal fracture/dislocation on 11/25 -Underwent closed treatment of right tibia fracture on 11/25 -Orthopedics recommended Eliquis 2.5 mg twice daily for 30 days for DVT prophylaxis -Continue vitamin D 1000 units daily, pain control, PT OT -Outpatient follow-up with Dr. Jena Gauss in 2 weeks for splint removal, wound check and repeat x-rays  Chronic pain -Continue oxycodone as needed for chronic,  moderate pain   Thrombocytopenia -Resolved   Diarrhea -Improving, continue loperamide as needed.      Morbid obesity with BMI of 40.0-44.9, adult (HCC) Estimated body mass index is 42.57 kg/m as calculated from the following:   Height as of this encounter: 5\' 8"  (1.727 m).   Weight as of this encounter: 127 kg. -Placed on heart healthy diet, continue PT OT     Pain control - [place] Controlled Substance Reporting System database was reviewed. and patient was instructed, not to drive, operate heavy machinery, perform activities at heights, swimming or participation in water activities or provide baby-sitting services while on Pain, Sleep and Anxiety Medications; until their outpatient Physician has advised to do so again. Also recommended to not to take more than prescribed Pain, Sleep and Anxiety Medications.  Consultants: Orthopedics Procedures performed: open reduction, internal fixation of the right first tarsometatarsal fracture/dislocation on 11/25  Disposition: Skilled nursing facility Diet recommendation:  Discharge Diet Orders (From admission, onward)     Start     Ordered   09/29/23 0000  Diet - low sodium heart healthy        09/29/23 1432           Cardiac diet DISCHARGE MEDICATION: Allergies as of 09/29/2023       Reactions   Ceftriaxone Anaphylaxis   See progess note. Administration results in hives, throat pain, flushing and SOB.   Ciprofloxacin Swelling   Lips swell, tongue swells, face swells   Citalopram Other (See Comments)   Possible cause of pancytopenia. Swelling of tongue, face and throat   Lamotrigine Other (  See Comments)   Possible cause of pancytopenia. Swelling of face, throat and tongue   Sulfa Antibiotics Anaphylaxis   Tramadol Anaphylaxis, Shortness Of Breath, Swelling        Medication List     STOP taking these medications    naproxen 500 MG tablet Commonly known as: NAPROSYN       TAKE these medications     acetaminophen 325 MG tablet Commonly known as: TYLENOL Take 2 tablets (650 mg total) by mouth every 6 (six) hours as needed for mild pain (pain score 1-3), fever or headache.   apixaban 2.5 MG Tabs tablet Commonly known as: Eliquis Take 1 tablet (2.5 mg total) by mouth 2 (two) times daily.   desvenlafaxine 50 MG 24 hr tablet Commonly known as: PRISTIQ Take 50 mg by mouth every morning.   divalproex 250 MG 24 hr tablet Commonly known as: DEPAKOTE ER Take 250 mg by mouth at bedtime.   gabapentin 800 MG tablet Commonly known as: NEURONTIN Take 800 mg by mouth 3 (three) times daily as needed (for pain).   loperamide 2 MG capsule Commonly known as: IMODIUM Take 1 capsule (2 mg total) by mouth every 8 (eight) hours as needed for diarrhea or loose stools.   methocarbamol 500 MG tablet Commonly known as: ROBAXIN Take 1 tablet (500 mg total) by mouth every 6 (six) hours as needed for muscle spasms.   multivitamin with minerals Tabs tablet Take 1 tablet by mouth daily. Start taking on: September 30, 2023   ondansetron 4 MG tablet Commonly known as: ZOFRAN Take 1 tablet (4 mg total) by mouth every 6 (six) hours as needed for nausea.   oxyCODONE 15 MG immediate release tablet Commonly known as: ROXICODONE Take 1 tablet (15 mg total) by mouth every 4 (four) hours as needed for severe pain (pain score 7-10). What changed:  when to take this reasons to take this   vitamin D3 25 MCG tablet Commonly known as: CHOLECALCIFEROL Take 1 tablet (1,000 Units total) by mouth daily.        Follow-up Information     Haddix, Gillie Manners, MD. Schedule an appointment as soon as possible for a visit in 2 week(s).   Specialty: Orthopedic Surgery Why: for wound check, splint/suture removal Contact information: [address] [place] [address] [phone]         Courtney Paris, NP. Schedule an appointment as soon as possible for a visit in 3 week(s).   Specialty: Nurse  Practitioner Why: for hospital follow-up Contact information: [address] [place] [address] [phone]                Discharge Exam: Ceasar Mons Weights   09/13/23 1630 09/15/23 0809  Weight: 127.4 kg 127 kg   S: No acute complaints, working with PT OT.  Pain controlled  BP 128/74 (BP Location: Right Arm)   Pulse (!) 101   Temp 98 F (36.7 C) (Oral)   Resp 16   Ht 5\' 8"  (1.727 m)   Wt 127 kg   LMP  (LMP Unknown)   SpO2 98%   BMI 42.57 kg/m   Physical Exam General: Alert and oriented x 3, NAD Cardiovascular: S1 S2 clear, RRR.  Respiratory: CTAB, no wheezing, rales or rhonchi Gastrointestinal: Soft, nontender, nondistended, NBS Ext: no pedal edema bilaterally, RLE dressing intact Psych: Normal affect    Condition at discharge: fair  The results of significant diagnostics from this hospitalization (including imaging, microbiology, ancillary and laboratory)  are listed below for reference.   Imaging Studies: DG Foot Complete Right  Result Date: 09/29/2023 CLINICAL DATA:  Status post internal fixation of right foot fracture for 2 weeks. EXAM: RIGHT FOOT COMPLETE - 3+ VIEW COMPARISON:  September 15, 2023. FINDINGS: Status post surgical fusion of first tarsometatarsal joint for treatment of first metatarsal fracture. No other fracture or dislocation is noted. Status post surgical internal fixation of distal right tibial fracture. Foot has been casted and immobilized. IMPRESSION: Postsurgical changes as noted above. Electronically Signed   By: Lupita Raider M.D.   On: 09/29/2023 14:05   DG Foot Complete Right  Result Date: 09/15/2023 CLINICAL DATA:  Postop fracture fixation. EXAM: RIGHT FOOT COMPLETE - 3+ VIEW COMPARISON:  Preoperative imaging FINDINGS: Interval plate and screw fixation of the first tarsal metatarsal joint traversing comminuted proximal metatarsal fracture. Improve Lisfranc alignment. The second metatarsal neck fracture is faintly visualized.  Overlying splint material limits osseous and soft tissue fine detail. Distal tibial fracture fixation hardware is partially included in the field of view. IMPRESSION: Interval plate and screw fixation of the first tarsometatarsal joint traversing comminuted proximal metatarsal fracture. Electronically Signed   By: Narda Rutherford M.D.   On: 09/15/2023 15:36   DG Foot Complete Right  Result Date: 09/15/2023 CLINICAL DATA:  Elective surgery. EXAM: RIGHT FOOT COMPLETE - 3+ VIEW COMPARISON:  Preoperative imaging. FINDINGS: Nine fluoroscopic spot views of the right foot obtained in the operating room. Digital images during first tarsal metatarsal fusion. Fluoroscopy time 51.8 seconds. Dose 1.3 mGy. IMPRESSION: Intraoperative fluoroscopy during right foot surgery. Electronically Signed   By: Narda Rutherford M.D.   On: 09/15/2023 15:35   DG C-Arm 1-60 Min-No Report  Result Date: 09/15/2023 Fluoroscopy was utilized by the requesting physician.  No radiographic interpretation.   CT FOOT RIGHT WO CONTRAST  Result Date: 09/14/2023 CLINICAL DATA:  Right lower leg and foot injury. EXAM: CT OF THE RIGHT TIBIA FIBULA WITHOUT CONTRAST CT OF THE RIGHT FOOT WITHOUT CONTRAST TECHNIQUE: Multidetector CT imaging of the right tibia fibula and foot was performed according to the standard protocol. Multiplanar CT image reconstructions were also generated. RADIATION DOSE REDUCTION: This exam was performed according to the departmental dose-optimization program which includes automated exposure control, adjustment of the mA and/or kV according to patient size and/or use of iterative reconstruction technique. COMPARISON:  Right lower leg and foot x-rays from yesterday. FINDINGS: Bones/Joint/Cartilage Chronic, partially united fracture of the distal tibial diaphysis status post ORIF. Hardware is intact. New acute nondisplaced fractures through the previously healed distal tibial diaphyseal fracture (series 7, image 45; series  8, image 32). Acute minimally angulated and impacted oblique fracture of the mid fibular diaphysis just superior to the previously healed fibular diaphyseal fracture (series 7, image 43). Acute nondisplaced oblique fracture through the lateral malleolus (series 8, image 57). Acute fracture at the base of the first metatarsal with impaction and medial subluxation of the dorsal first metatarsal base with respect to the medial cuneiform. Second through fifth TMT joint alignment is normal. Acute impacted fracture of the second metatarsal neck. Osteopenia.  Joint spaces are preserved. No joint effusion. Ligaments Ligaments are suboptimally evaluated by CT. Muscles and Tendons Grossly intact. Soft tissue Dorsal foot soft tissue swelling. No fluid collection or hematoma. No soft tissue mass. IMPRESSION: 1. Acute nondisplaced fractures through the previously healed distal tibial diaphyseal fracture. 2. Acute minimally angulated and impacted oblique fracture of the mid fibular diaphysis just superior to the previously healed diaphyseal  fracture. 3. Acute nondisplaced oblique fracture through the lateral malleolus. 4. Acute fracture at the base of the first metatarsal with impaction and medial subluxation of the dorsal first metatarsal base with respect to the medial cuneiform. Second through fifth TMT joint alignment is normal. 5. Acute impacted fracture of the second metatarsal neck. Electronically Signed   By: Obie Dredge M.D.   On: 09/14/2023 11:44   CT TIBIA FIBULA RIGHT WO CONTRAST  Result Date: 09/14/2023 CLINICAL DATA:  Right lower leg and foot injury. EXAM: CT OF THE RIGHT TIBIA FIBULA WITHOUT CONTRAST CT OF THE RIGHT FOOT WITHOUT CONTRAST TECHNIQUE: Multidetector CT imaging of the right tibia fibula and foot was performed according to the standard protocol. Multiplanar CT image reconstructions were also generated. RADIATION DOSE REDUCTION: This exam was performed according to the departmental  dose-optimization program which includes automated exposure control, adjustment of the mA and/or kV according to patient size and/or use of iterative reconstruction technique. COMPARISON:  Right lower leg and foot x-rays from yesterday. FINDINGS: Bones/Joint/Cartilage Chronic, partially united fracture of the distal tibial diaphysis status post ORIF. Hardware is intact. New acute nondisplaced fractures through the previously healed distal tibial diaphyseal fracture (series 7, image 45; series 8, image 32). Acute minimally angulated and impacted oblique fracture of the mid fibular diaphysis just superior to the previously healed fibular diaphyseal fracture (series 7, image 43). Acute nondisplaced oblique fracture through the lateral malleolus (series 8, image 57). Acute fracture at the base of the first metatarsal with impaction and medial subluxation of the dorsal first metatarsal base with respect to the medial cuneiform. Second through fifth TMT joint alignment is normal. Acute impacted fracture of the second metatarsal neck. Osteopenia.  Joint spaces are preserved. No joint effusion. Ligaments Ligaments are suboptimally evaluated by CT. Muscles and Tendons Grossly intact. Soft tissue Dorsal foot soft tissue swelling. No fluid collection or hematoma. No soft tissue mass. IMPRESSION: 1. Acute nondisplaced fractures through the previously healed distal tibial diaphyseal fracture. 2. Acute minimally angulated and impacted oblique fracture of the mid fibular diaphysis just superior to the previously healed diaphyseal fracture. 3. Acute nondisplaced oblique fracture through the lateral malleolus. 4. Acute fracture at the base of the first metatarsal with impaction and medial subluxation of the dorsal first metatarsal base with respect to the medial cuneiform. Second through fifth TMT joint alignment is normal. 5. Acute impacted fracture of the second metatarsal neck. Electronically Signed   By: Obie Dredge M.D.    On: 09/14/2023 11:44   Chest Portable 1 View  Result Date: 09/13/2023 CLINICAL DATA:  Preop chest exam EXAM: PORTABLE CHEST 1 VIEW COMPARISON:  08/17/2022 FINDINGS: Low lung volumes accentuate bronchovascular markings. Right basilar scarring. No focal consolidation, pleural effusion, or pneumothorax. Stable cardiomediastinal silhouette. Aortic atherosclerotic calcification. IMPRESSION: Low lung volumes without acute cardiopulmonary disease. Electronically Signed   By: Minerva Fester M.D.   On: 09/13/2023 23:33   DG Foot Complete Right  Result Date: 09/13/2023 CLINICAL DATA:  Right lower extremity pain after fall. Deformity to dorsum of right foot and toes are not aligned. EXAM: RIGHT FOOT COMPLETE - 3+ VIEW; RIGHT TIBIA AND FIBULA - 2 VIEW COMPARISON:  Radiographs 04/26/2020 and 06/19/2015 FINDINGS: Plate screw fixation of the remote fracture of the distal right tibia. Remote fracture deformities of the tibia and fibula. Acute nondisplaced oblique fracture of the mid right fibula. Question additional nondisplaced fracture of the mid to distal right tibia. This is only seen on lateral view and may be  acute, chronic, or projection artifact. Acute displaced fracture of the base of the first metatarsal. The fracture line extends into the tarsometatarsal joint and Lisfranc joint. There is dorsal and medial displacement of the dominant distal fragment. Soft tissue swelling over the dorsum of the foot. IMPRESSION: 1. Acute displaced fracture of the base of the first metatarsal. The fracture line extends into the tarsometatarsal joint and Lisfranc joint. 2. Acute nondisplaced oblique fracture of the mid right fibula. 3. Question acute nondisplaced fracture of the mid to distal right tibia. This is only seen on lateral view. Differential considerations include chronic fracture or projection artifact. Consider CT for further evaluation. Electronically Signed   By: Minerva Fester M.D.   On: 09/13/2023 19:06   DG  Tibia/Fibula Right  Result Date: 09/13/2023 CLINICAL DATA:  Right lower extremity pain after fall. Deformity to dorsum of right foot and toes are not aligned. EXAM: RIGHT FOOT COMPLETE - 3+ VIEW; RIGHT TIBIA AND FIBULA - 2 VIEW COMPARISON:  Radiographs 04/26/2020 and 06/19/2015 FINDINGS: Plate screw fixation of the remote fracture of the distal right tibia. Remote fracture deformities of the tibia and fibula. Acute nondisplaced oblique fracture of the mid right fibula. Question additional nondisplaced fracture of the mid to distal right tibia. This is only seen on lateral view and may be acute, chronic, or projection artifact. Acute displaced fracture of the base of the first metatarsal. The fracture line extends into the tarsometatarsal joint and Lisfranc joint. There is dorsal and medial displacement of the dominant distal fragment. Soft tissue swelling over the dorsum of the foot. IMPRESSION: 1. Acute displaced fracture of the base of the first metatarsal. The fracture line extends into the tarsometatarsal joint and Lisfranc joint. 2. Acute nondisplaced oblique fracture of the mid right fibula. 3. Question acute nondisplaced fracture of the mid to distal right tibia. This is only seen on lateral view. Differential considerations include chronic fracture or projection artifact. Consider CT for further evaluation. Electronically Signed   By: Minerva Fester M.D.   On: 09/13/2023 19:06    Microbiology: Results for orders placed or performed during the hospital encounter of 09/13/23  Surgical pcr screen     Status: None   Collection Time: 09/14/23  3:39 AM   Specimen: Nasal Mucosa; Nasal Swab  Result Value Ref Range Status   MRSA, PCR NEGATIVE NEGATIVE Final   Staphylococcus aureus NEGATIVE NEGATIVE Final    Comment: (NOTE) The Xpert SA Assay (FDA approved for NASAL specimens in patients 14 years of age and older), is one component of a comprehensive surveillance program. It is not intended to  diagnose infection nor to guide or monitor treatment. Performed at [facility], [address], [place], [place] [address]    *Note: Due to a large number of results and/or encounters for the requested time period, some results have not been displayed. A complete set of results can be found in Results Review.    Labs: CBC: Recent Labs  Lab 09/25/23 0931 09/28/23 0912  WBC 4.7 5.8  HGB 11.2* 12.8  HCT 35.0* 38.6  MCV 94.3 96.0  PLT 198 200   Basic Metabolic Panel: Recent Labs  Lab 09/25/23 0931 09/28/23 0912 09/29/23 0427  NA 139 139  --   K 4.3 4.3  --   CL 105 105  --   CO2 26 26  --   GLUCOSE 92 94  --   BUN 19 21*  --   CREATININE 0.72 0.80 0.84  CALCIUM 8.3* 8.7*  --    Liver Function Tests: No results for input(s): "AST", "ALT", "ALKPHOS", "BILITOT", "PROT", "ALBUMIN" in the last 168 hours. CBG: No results for input(s): "GLUCAP" in the last 168 hours.  Discharge time spent: greater than 30 minutes.  Signed: Thad Ranger, MD [facility] 09/29/2023

## 2023-09-29 NOTE — Progress Notes (Addendum)
[facility]                                                                              Jordan [name], is a 57 y.o. female, DOB - 13-Nov-1965, YHC:[id number] Admit date - 09/13/2023    Outpatient Primary MD for the patient is Courtney Paris, NP  LOS - 16  days  Chief Complaint  Patient presents with   Fall       Brief summary   Patient is a 57 year old female with history of bipolar disorder, obesity, presented to ED after mechanical fall.  Patient injured her right lower leg and foot in the fall and was unable to bear weight or ambulate after the fall.  Pain was severe. She was found to have closed fracture of right tibia and fibula, displaced right first metatarsal fracture Status post surgery  Assessment & Plan    Principal Problem:   Closed fracture of right tibia and fibula Closed displaced fracture of the first metatarsal bone of the right foot -Orthopedics was consulted, patient underwent open reduction, internal fixation of the right first tarsometatarsal fracture/dislocation on 11/25 -Underwent closed treatment of right tibia fracture on 11/25 -Orthopedics following, recommended Eliquis 2.5 mg twice daily for 30 days for DVT prophylaxis -Continue vitamin D 1000 units daily, pain control, PT OT -No acute issues, awaiting authorization for SNF  Active Problems: Chronic pain -Continue oxycodone as needed for chronic, moderate pain  Thrombocytopenia -Resolved  Diarrhea -Resolved with loperamide x 1    Morbid obesity with BMI of 40.0-44.9, adult (HCC) Estimated body mass index is 42.57 kg/m as calculated from the following:   Height as of this encounter: 5\' 8"  (1.727 m).   Weight as of this encounter: 127 kg.  Code Status: Full code DVT Prophylaxis:  enoxaparin (LOVENOX) injection 40 mg Start: 09/16/23 0800 SCDs Start: 09/15/23 1404 SCDs Start: 09/13/23 2304   Level of Care: Level of care: Med-Surg Family Communication: Updated  patient Disposition Plan:      Remains inpatient appropriate: Awaiting insurance authorization for placement   Procedures: 11/25 -open reduction, internal fixation of the right first tarsometatarsal fracture/dislocation -Underwent closed treatment of right tibia fracture  Consultants:   Orthopedics  Antimicrobials:   Anti-infectives (From admission, onward)    Start     Dose/Rate Route Frequency Ordered Stop   09/15/23 1800  ceFAZolin (ANCEF) IVPB 2g/100 mL premix        2 g 200 mL/hr over 30 Minutes Intravenous Every 8 hours 09/15/23 1403 09/16/23 2054   09/15/23 1210  vancomycin (VANCOCIN) powder  Status:  Discontinued          As needed 09/15/23 1217 09/15/23 1223          Medications  cholecalciferol  1,000 Units Oral Daily   divalproex  250 mg Oral QHS   enoxaparin (LOVENOX) injection  40 mg Subcutaneous Q24H   feeding supplement  237 mL Oral BID BM   multivitamin with minerals  1 tablet Oral Daily   venlafaxine XR  75 mg Oral Q breakfast      Subjective:   Jordan [name] was seen and examined today.  Diarrhea resolved, no acute complaints, awaiting SNF authorization.  Has been working with PT.    Objective:   Vitals:   09/28/23 1423 09/28/23 2018 09/29/23 0423 09/29/23 0758  BP: 114/69 103/71 129/74 128/74  Pulse:  (!) 107 94 (!) 101  Resp: 18 18 18 16   Temp: 97.8 F (36.6 C) 97.7 F (36.5 C) 98.6 F (37 C) 98 F (36.7 C)  TempSrc: Oral Oral Oral Oral  SpO2: 90% 96% 96% 98%  Weight:      Height:        Intake/Output Summary (Last 24 hours) at 09/29/2023 1317 Last data filed at 09/29/2023 0839 Gross per 24 hour  Intake 360 ml  Output 2600 ml  Net -2240 ml     Wt Readings from Last 3 Encounters:  09/15/23 127 kg  09/05/23 127.4 kg  08/18/23 126.4 kg    Physical Exam General: Alert and oriented x 3, NAD Cardiovascular: S1 S2 clear, RRR.  Respiratory: CTAB Gastrointestinal: Soft, nontender, nondistended, NBS Ext: no pedal edema  bilaterally Neuro: no new deficits Skin: RLE dressing intact Psych: Normal affect   Data Reviewed:  I have personally reviewed following labs    CBC Lab Results  Component Value Date   WBC 5.8 09/28/2023   RBC 4.02 09/28/2023   HGB 12.8 09/28/2023   HCT 38.6 09/28/2023   MCV 96.0 09/28/2023   MCH 31.8 09/28/2023   PLT 200 09/28/2023   MCHC 33.2 09/28/2023   RDW 14.0 09/28/2023   LYMPHSABS 0.9 09/13/2023   MONOABS 0.8 09/13/2023   EOSABS 0.0 09/13/2023   BASOSABS 0.0 09/13/2023     Last metabolic panel Lab Results  Component Value Date   NA 139 09/28/2023   K 4.3 09/28/2023   CL 105 09/28/2023   CO2 26 09/28/2023   BUN 21 (H) 09/28/2023   CREATININE 0.84 09/29/2023   GLUCOSE 94 09/28/2023   GFRNONAA >60 09/29/2023   GFRAA >60 05/03/2020   CALCIUM 8.7 (L) 09/28/2023   PHOS 4.6 08/22/2022   PROT 6.1 (L) 11/21/2022   ALBUMIN 3.3 (L) 11/21/2022   BILITOT 0.8 11/21/2022   ALKPHOS 93 11/21/2022   AST 17 11/21/2022   ALT 14 11/21/2022   ANIONGAP 8 09/28/2023    CBG (last 3)  No results for input(s): "GLUCAP" in the last 72 hours.    Coagulation Profile: No results for input(s): "INR", "PROTIME" in the last 168 hours.   Radiology Studies: I have personally reviewed the imaging studies  No results found.     Thad Ranger M.D. [facility] 09/29/2023, 1:17 PM  Available via Epic secure chat 7am-7pm After 7 pm, please refer to night coverage provider listed on amion.

## 2023-09-29 NOTE — Progress Notes (Signed)
Pt discharging to a SNF, pt has home meds locked up in the main pharmacy, pt will not be able to carry them with her to be used at the SNF. Spoke with pt, pt called a friend, Suzette Battiest, she will come to the hospital and will pick up the pt's home medications (pt's RN Ladona Ridgel will pick up the meds from main pharmacy and bring them up to unit) and will sign them back to the pt and pt/pt's nurse Ladona Ridgel will give the medications to her friend [place] when she arrives.  Pt's nurse Ladona Ridgel aware of the plan.   Pt's AVS updated with next meds due, pt's RN Ladona Ridgel will update again if any PRN meds are given and print for PTAR when they arrive and also give the pt a copy as pt has asked for a copy for herself.   Parilee Hally,RN SWOT

## 2023-09-29 NOTE — TOC Progression Note (Addendum)
Transition of Care [facility]) - Progression Note    Patient Details  Name: Jordan [name] MRN: [id number] Date of Birth: January 20, 1966  [facility] CM/SW Contact  Lorri Frederick, LCSW Phone Number: 09/29/2023, 10:21 AM  Clinical Narrative:   CSW waiting on update from [facility] regarding SNF auth  1430: Message from [facility]  SNF auth approved, they can receive pt today.  MD notified.   Expected Discharge Plan: Skilled Nursing Facility Barriers to Discharge: Insurance Authorization  Expected Discharge Plan and Services In-house Referral: Clinical Social Work   Post Acute Care Choice: IP Rehab Living arrangements for the past 2 months: Single Family Home                                       Social Determinants of Health (SDOH) Interventions SDOH Screenings   Food Insecurity: No Food Insecurity (09/14/2023)  Recent Concern: Food Insecurity - Food Insecurity Present (07/16/2023)  Housing: Low Risk  (09/14/2023)  Transportation Needs: No Transportation Needs (09/14/2023)  Recent Concern: Transportation Needs - Unmet Transportation Needs (07/16/2023)  Utilities: Not At Risk (09/14/2023)  Depression (PHQ2-9): Low Risk  (07/16/2023)  Tobacco Use: Low Risk  (09/15/2023)    Readmission Risk Interventions    08/22/2022    3:08 PM 08/19/2022   11:26 AM  Readmission Risk Prevention Plan  Post Dischage Appt  Complete  Medication Screening  Complete  Transportation Screening Complete Complete  PCP or Specialist Appt within 5-7 Days Complete   Home Care Screening Complete   Medication Review (RN CM) Complete

## 2023-09-29 NOTE — Progress Notes (Signed)
Mobility Specialist: Progress Note   09/29/23 1448  Mobility  Range of Motion/Exercises Active Assistive;Active;Right arm;Right leg;Left arm;Left leg  RLE Weight Bearing NWB  Activity Response Tolerated well  Mobility Referral Yes  Mobility visit 1 Mobility  Mobility Specialist Start Time (ACUTE ONLY) 1303  Mobility Specialist Stop Time (ACUTE ONLY) 1320  Mobility Specialist Time Calculation (min) (ACUTE ONLY) 17 min    Pt was agreeable to mobility session - received in bed. Performed lying marches, leg lifts, and L ankle pumps as well as arm exercises using bed rails. Had c/o ankle during BLE mobility. Placed on bed pan for BM and left with all needs met, call bell in reach. NT notified.   Maurene Capes Mobility Specialist Please contact via SecureChat or Rehab office at [phone]

## 2023-09-29 NOTE — Progress Notes (Signed)
[facility] Spiritual Care Note  Received call from [place] to request inpatient chaplain visit to help with social isolation and encouragement. Will place referral accordingly.   [address] [name], [place], [facility] Pager [phone] Voicemail [phone]

## 2023-09-29 NOTE — TOC Transition Note (Signed)
Transition of Care [facility]) - CM/SW Discharge Note   Patient Details  Name: Jordan [name] MRN: [id number] Date of Birth: 1966/03/17  [facility] CM/SW Contact:  Lorri Frederick, LCSW Phone Number: 09/29/2023, 2:54 PM   Clinical Narrative:   Pt discharging to [facility].  RN call report to [phone].     Final next level of care: Skilled Nursing Facility Barriers to Discharge: Barriers Resolved   Patient Goals and CMS Choice      Discharge Placement                Patient chooses bed at:  [facility]) Patient to be transferred to facility by: ptar Name of family member notified: friend Jone Patient and family notified of of transfer: 09/29/23  Discharge Plan and Services Additional resources added to the After Visit Summary for   In-house Referral: Clinical Social Work   Post Acute Care Choice: IP Rehab                               Social Determinants of Health (SDOH) Interventions SDOH Screenings   Food Insecurity: No Food Insecurity (09/14/2023)  Recent Concern: Food Insecurity - Food Insecurity Present (07/16/2023)  Housing: Low Risk  (09/14/2023)  Transportation Needs: No Transportation Needs (09/14/2023)  Recent Concern: Transportation Needs - Unmet Transportation Needs (07/16/2023)  Utilities: Not At Risk (09/14/2023)  Depression (PHQ2-9): Low Risk  (07/16/2023)  Tobacco Use: Low Risk  (09/15/2023)     Readmission Risk Interventions    08/22/2022    3:08 PM 08/19/2022   11:26 AM  Readmission Risk Prevention Plan  Post Dischage Appt  Complete  Medication Screening  Complete  Transportation Screening Complete Complete  PCP or Specialist Appt within 5-7 Days Complete   Home Care Screening Complete   Medication Review (RN CM) Complete

## 2023-09-29 NOTE — Progress Notes (Signed)
Pts home meds retrieved from pharmacy and given to Pt. Pt then gave meds to friend, Suzette Battiest who took them to pts home. Pt updated on discharge plan, xrays per Thyra Breed, PA, and AVS updated. Report called to [facility].

## 2023-09-29 NOTE — Progress Notes (Signed)
Orthopaedic Trauma Progress Note  SUBJECTIVE: Doing well this morning.  Pain controlled.  No specific issues.  Continues to await insurance authorization for SNF.  Feels that she is making progress with therapies.  OBJECTIVE:  Vitals:   09/29/23 0423 09/29/23 0758  BP: 129/74 128/74  Pulse: 94 (!) 101  Resp: 18 16  Temp: 98.6 F (37 C) 98 F (36.7 C)  SpO2: 96% 98%    General: Sitting up in bed, no acute distress Respiratory: No increased work of breathing.  RLE: Splint removed, incision as below.  Notable bruising and swelling throughout the foot.  Sutures left in place.  Nontender throughout the ankle or calf.  Tolerates gentle ankle motion.  Able to wiggle toes.  + DP pulse   IMAGING: Repeat imaging done this morning appears stable.  No signs of hardware failure or loosening.  LABS:  Results for orders placed or performed during the hospital encounter of 09/13/23 (from the past 24 hour(s))  Creatinine, serum     Status: None   Collection Time: 09/29/23  4:27 AM  Result Value Ref Range   Creatinine, Ser 0.84 0.44 - 1.00 mg/dL   GFR, Estimated >16 >10 mL/min   *Note: Due to a large number of results and/or encounters for the requested time period, some results have not been displayed. A complete set of results can be found in Results Review.    ASSESSMENT: Jordan [name] is a 57 y.o. female, 14 Days Post-Op s/p OPEN REDUCTION INTERNAL FIXATION RIGHT FOOT LISFRANC FRACTURE  CV/Blood loss: Hemoglobin 11.2 on 09/28/2023.  Hemodynamically stable  PLAN: Weightbearing: NWB RLE ROM: Okay for unrestricted knee ROM.  Okay to come out of the boot for gentle ankle motion as tolerated Incisional and dressing care: Change dressing as needed Showering: Ok to shower, ok to get incision wet Orthopedic device(s): Transitioned to cam boot RLE today Pain management: Continue multimodal regimen VTE prophylaxis: Lovenox, SCDs ID:  Ancef 2gm post op completed Foley/Lines:  No foley, KVO  IVFs Impediments to Fracture Healing: Vitamin D level low normal at 33. Start supplementation Dispo: PT/OT evaluation ongoing, recommending SNF.  Okay for discharge from ortho standpoint once insurance authorization received for SNF.    D/C recommendations: - Oxycodone 15 mg q 4 hours PRN, Robaxin for pain control - Eliquis 2.5 mg twice daily x 30 days for DVT prophylaxis - Continue 1000 units Vit D supplementation daily x 30 days  Follow - up plan: I will plan to follow-up with patient later this week for suture removal if she is still in the hospital.  If she discharges before 10/01/2023, will plan to see her back in the office in 1 week for wound check and suture removal  Contact information:  Truitt Merle MD, Jordan Breed PA-C. After hours and holidays please check [email or web address] for group call information for [facility]   Thompson Caul, PA-C [phone] (office) [email or web address]

## 2023-09-29 NOTE — Progress Notes (Signed)
Physical Therapy Treatment Patient Details Name: Jordan [name] MRN: [id number] DOB: Mar 03, 1966 Today's Date: 09/29/2023   History of Present Illness Pt is a 57 y/o female presenting on 11/23 after fall.  Found with R 1st tarsometatarsal fx, R tibia refracture and R fibula fx.  11/25 S/P ORIF 1st tarsometatarsal fx and closed treatment of R tibia fx. PMH includes: alcohol abuse, anemia, bipolar 1, CAP, sepsis, UTI, obesity.    PT Comments  Continuing work on functional mobility and activity tolerance;  Session focused on functional transfers, and notable progress in ability to heel-toe pivot on LLE; pt seemed pleased with the abiltiy to work on standing; Patient will benefit from continued inpatient follow up therapy, <3 hours/day     If plan is discharge home, recommend the following: A lot of help with walking and/or transfers;A lot of help with bathing/dressing/bathroom   Can travel by private vehicle     No  Equipment Recommendations  Other (comment) (TBD in next venue)    Recommendations for Other Services       Precautions / Restrictions Precautions Precautions: Fall Required Braces or Orthoses: Other Brace Other Brace: Cam boot RLE Restrictions RLE Weight Bearing: Non weight bearing     Mobility  Bed Mobility Overal bed mobility: Needs Assistance Bed Mobility: Supine to Sit     Supine to sit: Supervision     General bed mobility comments: cues not to WB through RLE during bed mobility    Transfers Overall transfer level: Needs assistance Equipment used: Rolling walker (2 wheels), None Transfers: Sit to/from Stand, Bed to chair/wheelchair/BSC Sit to Stand: Mod assist, +2 physical assistance, From elevated surface   Step pivot transfers: Mod assist, +2 safety/equipment       General transfer comment: Performed stand-pivot transfer bed to recliner on her L; good use of RW for stability; occasional touchign down of toe part of boot, pt states she did not shift  weight onto R foot    Ambulation/Gait               General Gait Details: unable   Stairs             Wheelchair Mobility     Tilt Bed    Modified Rankin (Stroke Patients Only)       Balance     Sitting balance-Leahy Scale: Fair       Standing balance-Leahy Scale: Poor                              Cognition Arousal: Alert Behavior During Therapy: WFL for tasks assessed/performed, Anxious Overall Cognitive Status: No family/caregiver present to determine baseline cognitive functioning Area of Impairment: Memory, Following commands, Safety/judgement, Problem solving, Awareness, Attention                               General Comments: frequent cues for safety with transfers and standing        Exercises      General Comments        Pertinent Vitals/Pain Pain Assessment Pain Assessment: Faces Faces Pain Scale: Hurts little more Pain Location: R LE Pain Descriptors / Indicators: Discomfort, Grimacing Pain Intervention(s): Monitored during session    Home Living                          Prior Function  PT Goals (current goals can now be found in the care plan section) Acute Rehab PT Goals Patient Stated Goal: to go home PT Goal Formulation: With patient Time For Goal Achievement: 09/30/23 Potential to Achieve Goals: Fair Progress towards PT goals: Progressing toward goals    Frequency    Min 1X/week      PT Plan      Co-evaluation              AM-PAC PT "6 Clicks" Mobility   Outcome Measure  Help needed turning from your back to your side while in a flat bed without using bedrails?: A Little Help needed moving from lying on your back to sitting on the side of a flat bed without using bedrails?: A Little Help needed moving to and from a bed to a chair (including a wheelchair)?: A Little Help needed standing up from a chair using your arms (e.g., wheelchair or bedside  chair)?: Total Help needed to walk in hospital room?: Total Help needed climbing 3-5 steps with a railing? : Total 6 Click Score: 12    End of Session Equipment Utilized During Treatment: Gait belt Activity Tolerance: Patient tolerated treatment well Patient left: with call bell/phone within reach;in chair;with chair alarm set Nurse Communication: Mobility status PT Visit Diagnosis: Other abnormalities of gait and mobility (R26.89);Muscle weakness (generalized) (M62.81);Pain Pain - Right/Left: Right Pain - part of body: Leg     Time: 1032-1050 PT Time Calculation (min) (ACUTE ONLY): 18 min  Charges:    $Therapeutic Activity: 8-22 mins PT General Charges $$ ACUTE PT VISIT: 1 Visit                     Van Clines, PT  Acute Rehabilitation Services Office [phone] Secure Chat welcomed    Jordan [name] 09/29/2023, 2:03 PM

## 2023-09-30 ENCOUNTER — Encounter: Payer: MEDICAID | Admitting: Physical Therapy

## 2023-10-03 ENCOUNTER — Ambulatory Visit: Payer: MEDICAID | Admitting: Obstetrics

## 2023-10-07 ENCOUNTER — Encounter: Payer: MEDICAID | Admitting: Physical Therapy

## 2023-10-07 ENCOUNTER — Ambulatory Visit: Payer: MEDICAID | Admitting: Obstetrics
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
## 2023-10-09 ENCOUNTER — Encounter: Payer: MEDICAID | Admitting: Physical Therapy

## 2023-10-10 ENCOUNTER — Encounter: Payer: Self-pay | Admitting: General Practice

## 2023-10-10 NOTE — Progress Notes (Signed)
[facility] Spiritual Care Note  Returned Javionna's call. She wanted to process an interpersonal frustration. Provided brief reflective listening and emotional support.   [address] [name], [place], [facility] Pager [phone] Voicemail [phone]

## 2023-10-13 ENCOUNTER — Ambulatory Visit: Payer: MEDICAID | Admitting: Radiation Oncology

## 2023-10-21 ENCOUNTER — Ambulatory Visit: Payer: MEDICAID | Admitting: Clinical

## 2023-10-21 ENCOUNTER — Encounter: Payer: MEDICAID | Admitting: Physical Therapy

## 2023-10-21 DIAGNOSIS — F3160 Bipolar disorder, current episode mixed, unspecified: Secondary | ICD-10-CM

## 2023-10-21 DIAGNOSIS — Z658 Other specified problems related to psychosocial circumstances: Secondary | ICD-10-CM

## 2023-10-23 ENCOUNTER — Encounter: Payer: MEDICAID | Admitting: Physical Therapy

## 2023-10-27 NOTE — BH Specialist Note (Signed)
 Integrated Behavioral Health via Telemedicine Visit  11/05/2023 Jordan [name] [id number]  Number of Integrated Behavioral Health Clinician visits: Additional Visit  Session Start time: 1448   Session End time: 1514  Total time in minutes: 26   Referring Provider: Baby Bolt, MD Jordan/Family location: Home [facility] Provider location: Center for [facility] at [facility]  All persons participating in visit: Jordan [name] and [facility] Kota Ciancio   Types of Service: Individual psychotherapy and Video visit  I connected with [name] and/or Fairy Privette's  n/a  via  Telephone or Video Enabled Telemedicine Application  (Video is Caregility application) and verified that I am speaking with the correct person using two identifiers. Discussed confidentiality: Yes   I discussed the limitations of telemedicine and the availability of in person appointments.  Discussed there is a possibility of technology failure and discussed alternative modes of communication if that failure occurs.  I discussed that engaging in this telemedicine visit, they consent to the provision of behavioral healthcare and the services will be billed under their insurance.  Jordan and/or legal guardian expressed understanding and consented to Telemedicine visit: Yes   Presenting Concerns:  Jordan and/or family reports the following symptoms/concerns: Tired, frustrated, lonely, while waiting for break in foot to heal up, during temporary stay at [facility] facility; literacy barrier has prevented her from figuring out how to order her "junk food" that helps her cope with current life stress. Pt is considering daughter's offer to stay in [place]  for 1-2 months, before making a decision to move or not, after she is discharged from the nursing facility.  Duration of problem: Ongoing; Severity of problem: moderate  Jordan and/or Family's Strengths/Protective  Factors: Social connections, Concrete supports in place (healthy food, safe environments, etc.), and Sense of purpose  Goals Addressed: Jordan will:  Reduce symptoms of: agitation, anxiety, depression, mood instability, and stress   Increase knowledge and/or ability of: stress reduction   Demonstrate ability to: Increase healthy adjustment to current life circumstances  Progress towards Goals: Ongoing  Interventions: Interventions utilized:  Motivational Interviewing and Supportive Reflection Standardized Assessments completed: Not Needed  Jordan and/or Family Response: Jordan agrees with treatment plan.   Assessment: Jordan currently experiencing Bipolar affective disorder; Psychosocial stress.   Jordan may benefit from continued therapeutic intervention  .  Plan: Follow up with behavioral health clinician on : Two weeks Behavioral recommendations:  -Continue to keep a hopeful outlook; maintaining positive connections with daughter and landlord during this time -Consider participation in nursing facility activities with others, for extra support Referral(s): Integrated [employer] (In Clinic)  I discussed the assessment and treatment plan with the Jordan and/or parent/guardian. They were provided an opportunity to ask questions and all were answered. They agreed with the plan and demonstrated an understanding of the instructions.   They were advised to call back or seek an in-person evaluation if the symptoms worsen or if the condition fails to improve as anticipated.  Georgia Kipper, LCSW     07/16/2023    5:18 PM 03/27/2023    4:22 PM 07/18/2022    1:43 PM 04/17/2022   11:19 AM 08/20/2021    2:13 PM  Depression screen PHQ 2/9  Decreased Interest 0 0 1 0 0  Down, Depressed, Hopeless 0 1 1 1  0  PHQ - 2 Score 0 1 2 1  0  Altered sleeping 0 2 1 1  0  Tired, decreased energy 0 1 1 0 0  Change in appetite 0 0 1 0 0  Feeling bad or failure about  yourself  1 0 0 2 0  Trouble concentrating 1 1  3  0  Moving slowly or fidgety/restless 1 1 1  0 0  Suicidal thoughts 0 0 0 0 0  PHQ-9 Score 3 6 6 7  0      07/16/2023    5:18 PM 03/27/2023    4:25 PM 07/18/2022    1:43 PM 04/17/2022   11:19 AM  GAD 7 : Generalized Anxiety Score  Nervous, Anxious, on Edge 1 2 1 1   Control/stop worrying 1 1 1  0  Worry too much - different things 1 1 1  0  Trouble relaxing 1 1 1  0  Restless 1 1 1  0  Easily annoyed or irritable 2 1 1 1   Afraid - awful might happen 0 1 0 1  Total GAD 7 Score 7 8 6  3

## 2023-11-05 ENCOUNTER — Telehealth: Payer: Self-pay | Admitting: Radiation Oncology

## 2023-11-05 ENCOUNTER — Ambulatory Visit (INDEPENDENT_AMBULATORY_CARE_PROVIDER_SITE_OTHER): Payer: MEDICAID | Admitting: Clinical

## 2023-11-05 DIAGNOSIS — F3160 Bipolar disorder, current episode mixed, unspecified: Secondary | ICD-10-CM | POA: Diagnosis not present

## 2023-11-05 DIAGNOSIS — Z658 Other specified problems related to psychosocial circumstances: Secondary | ICD-10-CM

## 2023-11-05 NOTE — BH Specialist Note (Deleted)
 Integrated Behavioral Health via Telemedicine Visit  11/05/2023 Shaylyn Bawa [id number]  Number of Integrated Behavioral Health Clinician visits: Additional Visit  Session Start time: 1448   Session End time: 1514  Total time in minutes: 26   Referring Provider: *** Patient/Family location: [facility] Provider location: *** All persons participating in visit: *** Types of Service: {CHL AMB TYPE OF SERVICE:[phone]}  I connected with [name] and/or Celese Orrego's {family members:20773} via  Telephone or Engineer, civil (consulting)  (Video is Caregility application) and verified that I am speaking with the correct person using two identifiers. Discussed confidentiality: {YES/NO:21197}  I discussed the limitations of telemedicine and the availability of in person appointments.  Discussed there is a possibility of technology failure and discussed alternative modes of communication if that failure occurs.  I discussed that engaging in this telemedicine visit, they consent to the provision of behavioral healthcare and the services will be billed under their insurance.  Patient and/or legal guardian expressed understanding and consented to Telemedicine visit: {YES/NO:21197}  Presenting Concerns: Patient and/or family reports the following symptoms/concerns: *** Duration of problem: ***; Severity of problem: {Mild/Moderate/Severe:20260}  Patient and/or Family's Strengths/Protective Factors: {CHL AMB BH PROTECTIVE [phone]}  Goals Addressed: Patient will:  Reduce symptoms of: {IBH Symptoms:21014056}   Increase knowledge and/or ability of: {IBH Patient Tools:21014057}   Demonstrate ability to: {IBH Goals:21014053}  Progress towards Goals: {CHL AMB BH PROGRESS TOWARDS GOALS:[id number]}  Interventions: Interventions utilized:  {IBH Interventions:21014054} Standardized Assessments completed: {IBH Screening Tools:21014051}  Patient and/or Family  Response: ***  Assessment: Patient currently experiencing ***.   Patient may benefit from ***.  Plan: Follow up with behavioral health clinician on : *** Behavioral recommendations: *** Referral(s): {IBH Referrals:21014055}  I discussed the assessment and treatment plan with the patient and/or parent/guardian. They were provided an opportunity to ask questions and all were answered. They agreed with the plan and demonstrated an understanding of the instructions.   They were advised to call back or seek an in-person evaluation if the symptoms worsen or if the condition fails to improve as anticipated.  [name] Jasiya Markie, LCSW

## 2023-11-05 NOTE — Telephone Encounter (Signed)
 1/15 @ 1:10 pm Patient called to speak about her good news from her Surgeon concerning her foot.  Called [facility] and transfer call as requested.

## 2023-11-10 ENCOUNTER — Ambulatory Visit: Payer: MEDICAID | Admitting: Radiation Oncology

## 2023-11-10 ENCOUNTER — Telehealth: Payer: Self-pay | Admitting: *Deleted

## 2023-11-10 NOTE — Telephone Encounter (Signed)
RETURNED PATIENT'S PHONE CALL, SPOKE WITH PATIENT. ?

## 2023-11-26 NOTE — BH Specialist Note (Signed)
Pt did not arrive to video visit and did not answer the phone; Left HIPPA-compliant message to call back Asher Muir from [employer] for [employer] at [facility] at  [phone] [facility] office).  ?; left MyChart message for patient.  ? ?

## 2023-12-04 ENCOUNTER — Ambulatory Visit: Payer: MEDICAID | Admitting: Clinical

## 2023-12-08 ENCOUNTER — Inpatient Hospital Stay: Payer: MEDICAID | Attending: Hematology and Oncology

## 2023-12-08 VITALS — BP 147/85 | HR 112 | Temp 98.0°F | Resp 18

## 2023-12-08 DIAGNOSIS — E538 Deficiency of other specified B group vitamins: Secondary | ICD-10-CM | POA: Diagnosis present

## 2023-12-08 MED ORDER — CYANOCOBALAMIN 1000 MCG/ML IJ SOLN
1000.0000 ug | Freq: Once | INTRAMUSCULAR | Status: AC
  Administered 2023-12-08: 1000 ug via INTRAMUSCULAR
  Filled 2023-12-08: qty 1

## 2023-12-22 ENCOUNTER — Emergency Department (HOSPITAL_COMMUNITY)
Admission: EM | Admit: 2023-12-22 | Discharge: 2023-12-23 | Disposition: A | Discharge status: Home / Self Care | Payer: MEDICAID | Source: Home / Self Care | Attending: Emergency Medicine | Admitting: Emergency Medicine

## 2023-12-22 ENCOUNTER — Other Ambulatory Visit: Payer: Self-pay

## 2023-12-22 ENCOUNTER — Encounter (HOSPITAL_COMMUNITY): Payer: Self-pay

## 2023-12-22 ENCOUNTER — Emergency Department (HOSPITAL_COMMUNITY): Payer: MEDICAID

## 2023-12-22 DIAGNOSIS — N3000 Acute cystitis without hematuria: Secondary | ICD-10-CM | POA: Insufficient documentation

## 2023-12-22 DIAGNOSIS — Z7901 Long term (current) use of anticoagulants: Secondary | ICD-10-CM | POA: Insufficient documentation

## 2023-12-22 DIAGNOSIS — Z85828 Personal history of other malignant neoplasm of skin: Secondary | ICD-10-CM | POA: Insufficient documentation

## 2023-12-22 LAB — CBC WITH DIFFERENTIAL/PLATELET
Abs Immature Granulocytes: 0.02 10*3/uL (ref 0.00–0.07)
Basophils Absolute: 0 10*3/uL (ref 0.0–0.1)
Basophils Relative: 0 %
Eosinophils Absolute: 0 10*3/uL (ref 0.0–0.5)
Eosinophils Relative: 0 %
HCT: 43.2 % (ref 36.0–46.0)
Hemoglobin: 14.8 g/dL (ref 12.0–15.0)
Immature Granulocytes: 0 %
Lymphocytes Relative: 15 %
Lymphs Abs: 0.8 10*3/uL (ref 0.7–4.0)
MCH: 30.4 pg (ref 26.0–34.0)
MCHC: 34.3 g/dL (ref 30.0–36.0)
MCV: 88.7 fL (ref 80.0–100.0)
Monocytes Absolute: 0.5 10*3/uL (ref 0.1–1.0)
Monocytes Relative: 9 %
Neutro Abs: 4 10*3/uL (ref 1.7–7.7)
Neutrophils Relative %: 76 %
Platelets: 167 10*3/uL (ref 150–400)
RBC: 4.87 MIL/uL (ref 3.87–5.11)
RDW: 12.6 % (ref 11.5–15.5)
WBC: 5.3 10*3/uL (ref 4.0–10.5)
nRBC: 0 % (ref 0.0–0.2)

## 2023-12-22 LAB — COMPREHENSIVE METABOLIC PANEL
ALT: 16 U/L (ref 0–44)
AST: 26 U/L (ref 15–41)
Albumin: 3.9 g/dL (ref 3.5–5.0)
Alkaline Phosphatase: 123 U/L (ref 38–126)
Anion gap: 10 (ref 5–15)
BUN: 9 mg/dL (ref 6–20)
CO2: 24 mmol/L (ref 22–32)
Calcium: 9.2 mg/dL (ref 8.9–10.3)
Chloride: 104 mmol/L (ref 98–111)
Creatinine, Ser: 0.71 mg/dL (ref 0.44–1.00)
GFR, Estimated: >60 mL/min (ref >60)
Glucose, Bld: 93 mg/dL (ref 70–99)
Potassium: 4 mmol/L (ref 3.5–5.1)
Sodium: 138 mmol/L (ref 135–145)
Total Bilirubin: 0.9 mg/dL (ref 0.0–1.2)
Total Protein: 7 g/dL (ref 6.5–8.1)

## 2023-12-22 LAB — URINALYSIS, MICROSCOPIC (REFLEX)

## 2023-12-22 LAB — URINALYSIS, ROUTINE W REFLEX MICROSCOPIC
Bilirubin Urine: NEGATIVE
Glucose, UA: NEGATIVE mg/dL
Ketones, ur: 40 mg/dL — AB
Leukocytes,Ua: NEGATIVE
Nitrite: POSITIVE — AB
Protein, ur: NEGATIVE mg/dL
Specific Gravity, Urine: 1.025 (ref 1.005–1.030)
pH: 6 (ref 5.0–8.0)

## 2023-12-22 LAB — LIPASE, BLOOD: Lipase: 27 U/L (ref 11–51)

## 2023-12-22 MED ORDER — SODIUM CHLORIDE 0.9 % IV BOLUS
1000.0000 mL | Freq: Once | INTRAVENOUS | Status: AC
  Administered 2023-12-22: 1000 mL via INTRAVENOUS

## 2023-12-22 MED ORDER — ONDANSETRON HCL 4 MG/2ML IJ SOLN
4.0000 mg | Freq: Once | INTRAMUSCULAR | Status: AC
  Administered 2023-12-22: 4 mg via INTRAVENOUS
  Filled 2023-12-22: qty 2

## 2023-12-22 MED ORDER — FOSFOMYCIN TROMETHAMINE 3 G PO PACK: PACK | ORAL | 0 refills | Status: DC

## 2023-12-22 MED ORDER — IOHEXOL 350 MG/ML SOLN
75.0000 mL | Freq: Once | INTRAVENOUS | Status: AC | PRN
  Administered 2023-12-22: 75 mL via INTRAVENOUS

## 2023-12-22 MED ORDER — FOSFOMYCIN TROMETHAMINE 3 G PO PACK
3.0000 g | PACK | Freq: Once | ORAL | Status: AC
  Administered 2023-12-22: 3 g via ORAL
  Filled 2023-12-22: qty 3

## 2023-12-22 NOTE — Care Management (Addendum)
 ED RNCM provided patient with PCS paperwork as requeewsted to have completed by her PCP, patient verbalized understanding. Patient also mentioned that she was supposed to start OP Rehab but had not heard from them.  RNCM noted a message from [facility] at [facility] requesting patient to call them to schedule appointment, placed info on AVS and updated patient as well.  No further ED RNCM needs identified.

## 2023-12-22 NOTE — BH Specialist Note (Unsigned)
 Pt did not arrive to video visit and did not answer the phone; Left HIPPA-compliant message to call back Asher Muir from [employer] for [employer] at [facility] at  [phone] [facility] office).  ?; left MyChart message for patient.  ? ?

## 2023-12-22 NOTE — ED Notes (Signed)
 After standing and sitting back down pt blood pressure read 79/27

## 2023-12-22 NOTE — ED Notes (Signed)
 Discharge pending TOC consult

## 2023-12-22 NOTE — ED Notes (Signed)
 [place]wick placed on pt at this time

## 2023-12-22 NOTE — ED Notes (Signed)
 Lying vs  Hr 86 Spo2 100% Rr 19 Bp 132/92  Sitting  VS - pt reports feeling dizzy and shaky HR 95 Spo2 94% Rr 36 Bp 146/81  Standing - pt unable to stand long enough for blood pressure cuff to run

## 2023-12-22 NOTE — Discharge Instructions (Addendum)
 As we discussed, your work-up in the ER revealed that you have a UTI. We have treated you with the first dose of antibiotics in the ER today and recommend that you take as prescribed in its entirety. We have also given you social work resources to help you adjust to being back home. Please call your pcp to follow-up at your earliest convenience.   Return if development of any new or worsening symptoms

## 2023-12-22 NOTE — ED Triage Notes (Signed)
 Pt bib ems from home c/o abdominal pain. Pt endorses N/V/D that started two days ago. No blood in stool and orthostatic negative.    BP 140/84 RA 98% HR 90 CBG 102

## 2023-12-22 NOTE — ED Notes (Signed)
Pt unable to ambulate at this time

## 2023-12-22 NOTE — ED Provider Notes (Signed)
 [place] EMERGENCY DEPARTMENT AT [facility] Provider Note   CSN: [id number] Arrival date & time: 12/22/23  4098     History {Add pertinent medical, surgical, social history, OB history to HPI:1} Chief Complaint  Patient presents with   Abdominal Pain    Ardyce Heyer is a 58 y.o. female.  Patient history of alcohol abuse, bipolar, megaloblastic anemia, obesity, squamous cell carcinoma of the vagina, frequent UTIs presents today with complaints of abdominal pain.  She states the same began 2 days ago and has been persistent since then.  She denies any hematemesis, hematochezia, or melena.  Pain is generalized throughout her lower abdomen and does not radiate.  Denies any history of similar symptoms previously.  Does endorse dysuria.  No fevers or chills. No recent antibiotics.   The history is provided by the patient. No language interpreter was used.  Abdominal Pain Associated symptoms: diarrhea, nausea and vomiting        Home Medications Prior to Admission medications   Medication Sig Start Date End Date Taking? Authorizing Provider  acetaminophen (TYLENOL) 325 MG tablet Take 2 tablets (650 mg total) by mouth every 6 (six) hours as needed for mild pain (pain score 1-3), fever or headache. 09/17/23   West Bali, PA-C  apixaban (ELIQUIS) 2.5 MG TABS tablet Take 1 tablet (2.5 mg total) by mouth 2 (two) times daily. 09/17/23 10/17/23  West Bali, PA-C  desvenlafaxine (PRISTIQ) 50 MG 24 hr tablet Take 50 mg by mouth every morning. 07/07/23   [provider]  divalproex (DEPAKOTE ER) 250 MG 24 hr tablet Take 250 mg by mouth at bedtime. 07/07/23   [provider]  gabapentin (NEURONTIN) 800 MG tablet Take 800 mg by mouth 3 (three) times daily as needed (for pain). 01/11/22   [provider]  loperamide (IMODIUM) 2 MG capsule Take 1 capsule (2 mg total) by mouth every 8 (eight) hours as needed for diarrhea or loose stools. 09/29/23   Rai,  Delene Ruffini, MD  methocarbamol (ROBAXIN) 500 MG tablet Take 1 tablet (500 mg total) by mouth every 6 (six) hours as needed for muscle spasms. 09/17/23   West Bali, PA-C  Multiple Vitamin (MULTIVITAMIN WITH MINERALS) TABS tablet Take 1 tablet by mouth daily. 09/30/23   Rai, Ripudeep K, MD  ondansetron (ZOFRAN) 4 MG tablet Take 1 tablet (4 mg total) by mouth every 6 (six) hours as needed for nausea. 09/17/23   West Bali, PA-C  oxyCODONE (ROXICODONE) 15 MG immediate release tablet Take 1 tablet (15 mg total) by mouth every 4 (four) hours as needed for severe pain (pain score 7-10). 09/17/23   West Bali, PA-C      Allergies    Ceftriaxone, Ciprofloxacin, Citalopram, Lamotrigine, Sulfa antibiotics, and Tramadol    Review of Systems   Review of Systems  Gastrointestinal:  Positive for abdominal pain, diarrhea, nausea and vomiting.  All other systems reviewed and are negative.   Physical Exam Updated Vital Signs BP 118/82   Pulse 86   Temp 97.8 F (36.6 C)   Resp 16   Ht 5\' 8"  (1.727 m)   Wt (!) 145.2 kg   LMP  (LMP Unknown)   SpO2 95%   BMI 48.66 kg/m  Physical Exam Vitals and nursing note reviewed.  Constitutional:      General: She is not in acute distress.    Appearance: Normal appearance. She is normal weight. She is not ill-appearing, toxic-appearing or diaphoretic.  HENT:     Head: Normocephalic and atraumatic.  Cardiovascular:     Rate and Rhythm: Normal rate.  Pulmonary:     Effort: Pulmonary effort is normal. No respiratory distress.  Abdominal:     General: Abdomen is flat.     Palpations: Abdomen is soft.     Tenderness: There is abdominal tenderness in the right lower quadrant, suprapubic area and left lower quadrant. There is no right CVA tenderness or left CVA tenderness.  Musculoskeletal:        General: Normal range of motion.     Cervical back: Normal range of motion.  Skin:    General: Skin is warm and dry.  Neurological:     General:  No focal deficit present.     Mental Status: She is alert.  Psychiatric:        Mood and Affect: Mood normal.        Behavior: Behavior normal.     ED Results / Procedures / Treatments   Labs (all labs ordered are listed, but only abnormal results are displayed) Labs Reviewed  URINALYSIS, ROUTINE W REFLEX MICROSCOPIC - Abnormal; Notable for the following components:      Result Value   APPearance HAZY (*)    Hgb urine dipstick MODERATE (*)    Ketones, ur 40 (*)    Nitrite POSITIVE (*)    All other components within normal limits  URINALYSIS, MICROSCOPIC (REFLEX) - Abnormal; Notable for the following components:   Bacteria, UA MANY (*)    All other components within normal limits  URINE CULTURE  COMPREHENSIVE METABOLIC PANEL  LIPASE, BLOOD  CBC WITH DIFFERENTIAL/PLATELET    EKG None  Radiology CT ABDOMEN PELVIS W CONTRAST Result Date: 12/22/2023 CLINICAL DATA:  Abdominal pain, acute, nonlocalized. History of uterine/cervical cancer. EXAM: CT ABDOMEN AND PELVIS WITH CONTRAST TECHNIQUE: Multidetector CT imaging of the abdomen and pelvis was performed using the standard protocol following bolus administration of intravenous contrast. RADIATION DOSE REDUCTION: This exam was performed according to the departmental dose-optimization program which includes automated exposure control, adjustment of the mA and/or kV according to patient size and/or use of iterative reconstruction technique. CONTRAST:  75mL OMNIPAQUE IOHEXOL 350 MG/ML SOLN COMPARISON:  CT abdomen/pelvis dated 12/10/2022. FINDINGS: Lower chest: Stable 7 mm nodule at the right lung base, essentially unchanged since April 2017, most compatible with a benign etiology. Linear atelectasis/scarring in the right middle lobe. Hepatobiliary: No suspicious focal hepatic lesion. Calcified granuloma again noted at the right hepatic lobe. Status post cholecystectomy. No biliary dilatation. Pancreas: Unremarkable. No pancreatic ductal  dilatation or surrounding inflammatory changes. Spleen: Mildly enlarged, unchanged. Adrenals/Urinary Tract: Adrenal glands are unremarkable. Stable 3.8 cm cyst arising from the superior pole of the right kidney, for which no follow-up imaging is recommended. No suspicious focal lesion. No renal calculi or hydronephrosis. Bladder is partially distended with mild circumferential bladder wall thickening. Stomach/Bowel: Stomach is within normal limits. Appendix appears normal. No evidence of bowel wall thickening, distention, or inflammatory changes. Vascular/Lymphatic: The abdominal aorta is normal in caliber with mild atherosclerotic calcification. No enlarged abdominal or pelvic lymph nodes. Reproductive: Status post hysterectomy. No adnexal masses. Other: No abdominopelvic ascites. No intraperitoneal free air. No abdominal wall hernia. Musculoskeletal: Similar chronic superior endplate compression deformity of L2. No suspicious osseous lesion. IMPRESSION: 1. Mild circumferential bladder wall thickening, concerning for cystitis. Recommend correlation with urinalysis. 2. Additional unchanged chronic findings, as described above. 3.  Aortic Atherosclerosis (ICD10-I70.0). Electronically Signed   By: Criss Rosales  Lateef M.D.   On: 12/22/2023 13:55    Procedures Procedures  {Document cardiac monitor, telemetry assessment procedure when appropriate:1}  Medications Ordered in ED Medications  sodium chloride 0.9 % bolus 1,000 mL (0 mLs Intravenous Stopped 12/22/23 1051)  ondansetron (ZOFRAN) injection 4 mg (4 mg Intravenous Given 12/22/23 0949)  ondansetron (ZOFRAN) injection 4 mg (4 mg Intravenous Given 12/22/23 1106)  iohexol (OMNIPAQUE) 350 MG/ML injection 75 mL (75 mLs Intravenous Contrast Given 12/22/23 1208)  fosfomycin (MONUROL) packet 3 g (3 g Oral Given 12/22/23 1531)    ED Course/ Medical Decision Making/ A&P   {   Click here for ABCD2, HEART and other calculatorsREFRESH Note before signing :1}                               Medical Decision Making Amount and/or Complexity of Data Reviewed Labs: ordered. Radiology: ordered.  Risk Prescription drug management.   This patient is a 58 y.o. female who presents to the ED for concern of abdominal pain, this involves an extensive number of treatment options, and is a complaint that carries with it a high risk of complications and morbidity. The emergent differential diagnosis prior to evaluation includes, but is not limited to,  UTI, AAA, gastroenteritis, appendicitis, Bowel obstruction, Bowel perforation. Gastroparesis, DKA, Hernia, Inflammatory bowel disease, mesenteric ischemia, pancreatitis, peritonitis SBP, volvulus.  This is not an exhaustive differential.   Past Medical History / Co-morbidities / Social History:  has a past medical history of Alcohol abuse, Anemia, Bipolar 1 disorder (HCC), CAP (community acquired pneumonia) (03/17/2015), History of radiation therapy (08/24/19-09/22/19), Megaloblastic anemia (02/22/2015), Mental disorder, Obesity, PICC line infection (05/17/2015), Sepsis due to Gram negative bacteria (MDR E Coli) (02/18/2015), 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HCC), UTI (lower urinary tract infection), and Vaginal Pap smear, abnormal.  Additional history: Chart reviewed. Pertinent results include:  Does have history of UTI previously, several antibiotic allergies  Physical Exam: Physical exam performed. The pertinent findings include: overall well appearing, abdomen mildly tender throughout without rebound or guarding  Lab Tests: I ordered, and personally interpreted labs.  The pertinent results include:  ***   Imaging Studies: I ordered imaging studies including ***. I independently visualized and interpreted imaging which showed ***. I agree with the radiologist interpretation.   Cardiac Monitoring:  The patient was maintained on a cardiac monitor.  My attending physician Dr. Marland Kitchen viewed and interpreted the cardiac monitored  which showed an underlying rhythm of: ***. I agree with this interpretation.   Medications: I ordered medication including ***  for ***. Reevaluation of the patient after these medicines showed that the patient {resolved/improved/worsened:23923::"improved"}. I have reviewed the patients home medicines and have made adjustments as needed.  Consultations Obtained: I requested consultation with the ***,  and discussed lab and imaging findings as well as pertinent plan - they recommend: ***   Disposition: After consideration of the diagnostic results and the patients response to treatment, I feel that *** .   ***emergency department workup does not suggest an emergent condition requiring admission or immediate intervention beyond what has been performed at this time. The plan is: ***. The patient is safe for discharge and has been instructed to return immediately for worsening symptoms, change in symptoms or any other concerns.  I discussed this case with my attending physician Dr. Marland Kitchen who cosigned this note including patient's presenting symptoms, physical exam, and planned diagnostics and interventions. Attending physician stated agreement with  plan or made changes to plan which were implemented.     {Document critical care time when appropriate:1} {Document review of labs and clinical decision tools ie heart score, Chads2Vasc2 etc:1}  {Document your independent review of radiology images, and any outside records:1} {Document your discussion with family members, caretakers, and with consultants:1} {Document social determinants of health affecting pt's care:1} {Document your decision making why or why not admission, treatments were needed:1} Final Clinical Impression(s) / ED Diagnoses Final diagnoses:  None    Rx / DC Orders ED Discharge Orders     None

## 2023-12-23 ENCOUNTER — Inpatient Hospital Stay (HOSPITAL_COMMUNITY)
Admission: EM | Admit: 2023-12-23 | Discharge: 2023-12-27 | DRG: 690 | Disposition: A | Discharge status: Skilled Nursing Facility | Payer: MEDICAID | Attending: Family Medicine | Admitting: Family Medicine

## 2023-12-23 ENCOUNTER — Telehealth: Payer: Self-pay | Admitting: *Deleted

## 2023-12-23 ENCOUNTER — Encounter (HOSPITAL_COMMUNITY): Payer: Self-pay

## 2023-12-23 ENCOUNTER — Other Ambulatory Visit: Payer: Self-pay

## 2023-12-23 DIAGNOSIS — F319 Bipolar disorder, unspecified: Secondary | ICD-10-CM | POA: Diagnosis present

## 2023-12-23 DIAGNOSIS — R21 Rash and other nonspecific skin eruption: Secondary | ICD-10-CM | POA: Diagnosis present

## 2023-12-23 DIAGNOSIS — B9689 Other specified bacterial agents as the cause of diseases classified elsewhere: Secondary | ICD-10-CM | POA: Diagnosis present

## 2023-12-23 DIAGNOSIS — Z6841 Body Mass Index (BMI) 40.0 and over, adult: Secondary | ICD-10-CM | POA: Diagnosis not present

## 2023-12-23 DIAGNOSIS — Z881 Allergy status to other antibiotic agents status: Secondary | ICD-10-CM

## 2023-12-23 DIAGNOSIS — Z885 Allergy status to narcotic agent status: Secondary | ICD-10-CM

## 2023-12-23 DIAGNOSIS — Z87440 Personal history of urinary (tract) infections: Secondary | ICD-10-CM

## 2023-12-23 DIAGNOSIS — E66813 Obesity, class 3: Secondary | ICD-10-CM | POA: Diagnosis present

## 2023-12-23 DIAGNOSIS — Z79899 Other long term (current) drug therapy: Secondary | ICD-10-CM

## 2023-12-23 DIAGNOSIS — D696 Thrombocytopenia, unspecified: Secondary | ICD-10-CM | POA: Diagnosis present

## 2023-12-23 DIAGNOSIS — R531 Weakness: Secondary | ICD-10-CM | POA: Diagnosis not present

## 2023-12-23 DIAGNOSIS — Z8544 Personal history of malignant neoplasm of other female genital organs: Secondary | ICD-10-CM

## 2023-12-23 DIAGNOSIS — Z923 Personal history of irradiation: Secondary | ICD-10-CM

## 2023-12-23 DIAGNOSIS — Z888 Allergy status to other drugs, medicaments and biological substances status: Secondary | ICD-10-CM

## 2023-12-23 DIAGNOSIS — N3001 Acute cystitis with hematuria: Secondary | ICD-10-CM | POA: Diagnosis present

## 2023-12-23 DIAGNOSIS — N390 Urinary tract infection, site not specified: Secondary | ICD-10-CM | POA: Diagnosis present

## 2023-12-23 DIAGNOSIS — Z7901 Long term (current) use of anticoagulants: Secondary | ICD-10-CM

## 2023-12-23 DIAGNOSIS — Z809 Family history of malignant neoplasm, unspecified: Secondary | ICD-10-CM | POA: Diagnosis not present

## 2023-12-23 DIAGNOSIS — T361X5A Adverse effect of cephalosporins and other beta-lactam antibiotics, initial encounter: Secondary | ICD-10-CM | POA: Diagnosis present

## 2023-12-23 DIAGNOSIS — E876 Hypokalemia: Secondary | ICD-10-CM | POA: Diagnosis present

## 2023-12-23 DIAGNOSIS — D72819 Decreased white blood cell count, unspecified: Secondary | ICD-10-CM | POA: Diagnosis present

## 2023-12-23 DIAGNOSIS — Z882 Allergy status to sulfonamides status: Secondary | ICD-10-CM

## 2023-12-23 DIAGNOSIS — Z811 Family history of alcohol abuse and dependence: Secondary | ICD-10-CM

## 2023-12-23 DIAGNOSIS — N3000 Acute cystitis without hematuria: Secondary | ICD-10-CM

## 2023-12-23 LAB — COMPREHENSIVE METABOLIC PANEL
ALT: 18 U/L (ref 0–44)
AST: 24 U/L (ref 15–41)
Albumin: 4.3 g/dL (ref 3.5–5.0)
Alkaline Phosphatase: 131 U/L — ABNORMAL HIGH (ref 38–126)
Anion gap: 11 (ref 5–15)
BUN: 12 mg/dL (ref 6–20)
CO2: 24 mmol/L (ref 22–32)
Calcium: 8.8 mg/dL — ABNORMAL LOW (ref 8.9–10.3)
Chloride: 101 mmol/L (ref 98–111)
Creatinine, Ser: 0.76 mg/dL (ref 0.44–1.00)
GFR, Estimated: >60 mL/min (ref >60)
Glucose, Bld: 82 mg/dL (ref 70–99)
Potassium: 3.6 mmol/L (ref 3.5–5.1)
Sodium: 136 mmol/L (ref 135–145)
Total Bilirubin: 1.5 mg/dL — ABNORMAL HIGH (ref 0.0–1.2)
Total Protein: 7.4 g/dL (ref 6.5–8.1)

## 2023-12-23 LAB — CBC WITH DIFFERENTIAL/PLATELET
Abs Immature Granulocytes: 0.02 10*3/uL (ref 0.00–0.07)
Basophils Absolute: 0 10*3/uL (ref 0.0–0.1)
Basophils Relative: 0 %
Eosinophils Absolute: 0 10*3/uL (ref 0.0–0.5)
Eosinophils Relative: 0 %
HCT: 44.8 % (ref 36.0–46.0)
Hemoglobin: 14.7 g/dL (ref 12.0–15.0)
Immature Granulocytes: 0 %
Lymphocytes Relative: 12 %
Lymphs Abs: 0.6 10*3/uL — ABNORMAL LOW (ref 0.7–4.0)
MCH: 30.1 pg (ref 26.0–34.0)
MCHC: 32.8 g/dL (ref 30.0–36.0)
MCV: 91.6 fL (ref 80.0–100.0)
Monocytes Absolute: 0.5 10*3/uL (ref 0.1–1.0)
Monocytes Relative: 9 %
Neutro Abs: 4.1 10*3/uL (ref 1.7–7.7)
Neutrophils Relative %: 79 %
Platelets: 180 10*3/uL (ref 150–400)
RBC: 4.89 MIL/uL (ref 3.87–5.11)
RDW: 12.6 % (ref 11.5–15.5)
WBC: 5.2 10*3/uL (ref 4.0–10.5)
nRBC: 0 % (ref 0.0–0.2)

## 2023-12-23 LAB — URINALYSIS, ROUTINE W REFLEX MICROSCOPIC
Bilirubin Urine: NEGATIVE
Glucose, UA: NEGATIVE mg/dL
Ketones, ur: 80 mg/dL — AB
Nitrite: NEGATIVE
Protein, ur: NEGATIVE mg/dL
Specific Gravity, Urine: 1.023 (ref 1.005–1.030)
pH: 5 (ref 5.0–8.0)

## 2023-12-23 MED ORDER — DIVALPROEX SODIUM ER 250 MG PO TB24
250.0000 mg | ORAL_TABLET | Freq: Every day | ORAL | Status: DC
Start: 2023-12-23 — End: 2023-12-23

## 2023-12-23 MED ORDER — LOPERAMIDE HCL 2 MG PO CAPS: 2.0000 mg | ORAL_CAPSULE | Freq: Three times a day (TID) | ORAL | Status: DC | PRN

## 2023-12-23 MED ORDER — VENLAFAXINE HCL ER 75 MG PO CP24
75.0000 mg | ORAL_CAPSULE | Freq: Every day | ORAL | Status: DC
  Administered 2023-12-24 – 2023-12-27 (×4): 75 mg via ORAL
  Filled 2023-12-23 (×4): qty 1

## 2023-12-23 MED ORDER — TRAZODONE HCL 50 MG PO TABS
25.0000 mg | ORAL_TABLET | Freq: Every evening | ORAL | Status: DC | PRN
  Administered 2023-12-24: 25 mg via ORAL
  Filled 2023-12-23: qty 1

## 2023-12-23 MED ORDER — DIPHENHYDRAMINE HCL 50 MG/ML IJ SOLN
25.0000 mg | Freq: Once | INTRAMUSCULAR | Status: AC
Start: 2023-12-23 — End: 2023-12-23
  Administered 2023-12-23: 25 mg via INTRAVENOUS
  Filled 2023-12-23: qty 1

## 2023-12-23 MED ORDER — NYSTATIN 100000 UNIT/GM EX POWD
Freq: Two times a day (BID) | CUTANEOUS | Status: DC
  Filled 2023-12-23: qty 15

## 2023-12-23 MED ORDER — METHYLPREDNISOLONE SODIUM SUCC 125 MG IJ SOLR
125.0000 mg | Freq: Once | INTRAMUSCULAR | Status: AC
  Administered 2023-12-23: 125 mg via INTRAVENOUS
  Filled 2023-12-23: qty 2

## 2023-12-23 MED ORDER — APIXABAN 2.5 MG PO TABS: 2.5000 mg | ORAL_TABLET | Freq: Two times a day (BID) | ORAL | Status: DC

## 2023-12-23 MED ORDER — ACETAMINOPHEN 650 MG RE SUPP: 650.0000 mg | Freq: Four times a day (QID) | RECTAL | Status: DC | PRN

## 2023-12-23 MED ORDER — ONDANSETRON HCL 4 MG/2ML IJ SOLN
4.0000 mg | Freq: Four times a day (QID) | INTRAMUSCULAR | Status: DC | PRN
  Administered 2023-12-24: 4 mg via INTRAVENOUS
  Filled 2023-12-23: qty 2

## 2023-12-23 MED ORDER — SODIUM CHLORIDE 0.9 % IV SOLN: 1.0000 g | INTRAVENOUS | Status: DC

## 2023-12-23 MED ORDER — ONDANSETRON HCL 4 MG/2ML IJ SOLN
4.0000 mg | Freq: Once | INTRAMUSCULAR | Status: AC
Start: 2023-12-23 — End: 2023-12-23
  Administered 2023-12-23: 4 mg via INTRAVENOUS
  Filled 2023-12-23: qty 2

## 2023-12-23 MED ORDER — OXYCODONE HCL 5 MG PO TABS
15.0000 mg | ORAL_TABLET | ORAL | Status: DC | PRN
  Administered 2023-12-27: 15 mg via ORAL
  Filled 2023-12-23: qty 3

## 2023-12-23 MED ORDER — ALBUTEROL SULFATE (2.5 MG/3ML) 0.083% IN NEBU: 2.5000 mg | INHALATION_SOLUTION | RESPIRATORY_TRACT | Status: DC | PRN

## 2023-12-23 MED ORDER — SODIUM CHLORIDE 0.9 % IV SOLN
1.0000 g | Freq: Once | INTRAVENOUS | Status: AC
  Administered 2023-12-23: 1 g via INTRAVENOUS
  Filled 2023-12-23: qty 10

## 2023-12-23 MED ORDER — ACETAMINOPHEN 325 MG PO TABS
650.0000 mg | ORAL_TABLET | Freq: Four times a day (QID) | ORAL | Status: DC | PRN
  Administered 2023-12-23: 650 mg via ORAL
  Filled 2023-12-23 (×2): qty 2

## 2023-12-23 MED ORDER — ENOXAPARIN SODIUM 80 MG/0.8ML IJ SOSY
0.5000 mg/kg | PREFILLED_SYRINGE | INTRAMUSCULAR | Status: DC
  Administered 2023-12-23 – 2023-12-26 (×4): 75 mg via SUBCUTANEOUS
  Filled 2023-12-23 (×4): qty 0.8

## 2023-12-23 MED ORDER — GABAPENTIN 400 MG PO CAPS: 800.0000 mg | ORAL_CAPSULE | Freq: Three times a day (TID) | ORAL | Status: DC | PRN

## 2023-12-23 MED ORDER — SODIUM CHLORIDE 0.9 % IV BOLUS
1000.0000 mL | Freq: Once | INTRAVENOUS | Status: AC
  Administered 2023-12-23: 1000 mL via INTRAVENOUS

## 2023-12-23 MED ORDER — ONDANSETRON HCL 4 MG PO TABS: 4.0000 mg | ORAL_TABLET | Freq: Four times a day (QID) | ORAL | Status: DC | PRN

## 2023-12-23 NOTE — ED Provider Notes (Signed)
 [place] [facility] AT [facility] Provider Note   CSN: [id number] Arrival date & time: 12/23/23  4098     History  Chief Complaint  Patient presents with   Abdominal Pain   Nausea    Jordan [name] is a 58 y.o. female.  Patient history of alcohol abuse, bipolar, megaloblastic anemia, obesity, squamous cell carcinoma of the vagina, frequent UTIs returns today with complaints of abdominal pain and weakness. Patient seen at [facility] yesterday by me, found to have a UTI and discharged with fosfomycin. However, returns today stating she is not feeling any better. She continues to have nausea but without any vomiting. States that she has not had diarrhea since yesterday morning. She does not have an appetite and struggles to care for and prepare food for herself so she has not been eating much. She still has suprapubic pain and dysuria. No fevers or chills. She continues to request SNF placement, states that she can't care for herself at home. Was recently discharged from rehab after she broke her foot. Denies any issues with the foot anymore, no pain or weakness in this area, but feels like she is still recovering from being immobilized with this. Does feel like this has gotten worse in the past few days. She now feels lightheaded and weak when she walks with a rollator which is her baseline. Denies headaches or vision changes, no unilateral weakness, or numbness/tingling.   The history is provided by the patient. No language interpreter was used.  Abdominal Pain Associated symptoms: nausea        Home Medications Prior to Admission medications   Medication Sig Start Date End Date Taking? Authorizing Provider  acetaminophen (TYLENOL) 325 MG tablet Take 2 tablets (650 mg total) by mouth every 6 (six) hours as needed for mild pain (pain score 1-3), fever or headache. 09/17/23   West Bali, PA-C  apixaban (ELIQUIS) 2.5 MG TABS tablet Take 1 tablet (2.5 mg total)  by mouth 2 (two) times daily. 09/17/23 10/17/23  West Bali, PA-C  desvenlafaxine (PRISTIQ) 50 MG 24 hr tablet Take 50 mg by mouth every morning. 07/07/23   [provider]  divalproex (DEPAKOTE ER) 250 MG 24 hr tablet Take 250 mg by mouth at bedtime. 07/07/23   [provider]  fosfomycin (MONUROL) 3 g PACK Take second dose in 3 days, on 3/6 and third dose in 3 more days on 3/9 12/22/23   Perl Folmar A, PA-C  gabapentin (NEURONTIN) 800 MG tablet Take 800 mg by mouth 3 (three) times daily as needed (for pain). 01/11/22   [provider]  loperamide (IMODIUM) 2 MG capsule Take 1 capsule (2 mg total) by mouth every 8 (eight) hours as needed for diarrhea or loose stools. 09/29/23   Rai, Delene Ruffini, MD  methocarbamol (ROBAXIN) 500 MG tablet Take 1 tablet (500 mg total) by mouth every 6 (six) hours as needed for muscle spasms. 09/17/23   West Bali, PA-C  Multiple Vitamin (MULTIVITAMIN WITH MINERALS) TABS tablet Take 1 tablet by mouth daily. 09/30/23   Rai, Ripudeep K, MD  ondansetron (ZOFRAN) 4 MG tablet Take 1 tablet (4 mg total) by mouth every 6 (six) hours as needed for nausea. 09/17/23   West Bali, PA-C  oxyCODONE (ROXICODONE) 15 MG immediate release tablet Take 1 tablet (15 mg total) by mouth every 4 (four) hours as needed for severe pain (pain score 7-10). 09/17/23   West Bali, PA-C  Allergies    Ceftriaxone, Ciprofloxacin, Citalopram, Lamotrigine, Sulfa antibiotics, and Tramadol    Review of Systems   Review of Systems  Gastrointestinal:  Positive for abdominal pain and nausea.  All other systems reviewed and are negative.   Physical Exam Updated Vital Signs BP (!) 157/74   Pulse 78   Temp 98.7 F (37.1 C)   Resp 18   LMP  (LMP Unknown)   SpO2 99%  Physical Exam Vitals and nursing note reviewed.  Constitutional:      General: She is not in acute distress.    Appearance: Normal appearance. She is normal weight. She is not  ill-appearing, toxic-appearing or diaphoretic.  HENT:     Head: Normocephalic and atraumatic.  Cardiovascular:     Rate and Rhythm: Normal rate.  Pulmonary:     Effort: Pulmonary effort is normal. No respiratory distress.  Abdominal:     General: Abdomen is flat.     Palpations: Abdomen is soft.     Tenderness: There is abdominal tenderness in the suprapubic area. There is no guarding or rebound.  Musculoskeletal:        General: Normal range of motion.     Cervical back: Normal range of motion.  Skin:    General: Skin is warm and dry.  Neurological:     General: No focal deficit present.     Mental Status: She is alert.  Psychiatric:        Mood and Affect: Mood normal.        Behavior: Behavior normal.     ED Results / Procedures / Treatments   Labs (all labs ordered are listed, but only abnormal results are displayed) Labs Reviewed  COMPREHENSIVE METABOLIC PANEL - Abnormal; Notable for the following components:      Result Value   Calcium 8.8 (*)    Alkaline Phosphatase 131 (*)    Total Bilirubin 1.5 (*)    All other components within normal limits  CBC WITH DIFFERENTIAL/PLATELET - Abnormal; Notable for the following components:   Lymphs Abs 0.6 (*)    All other components within normal limits  URINALYSIS, ROUTINE W REFLEX MICROSCOPIC - Abnormal; Notable for the following components:   APPearance HAZY (*)    Hgb urine dipstick MODERATE (*)    Ketones, ur 80 (*)    Leukocytes,Ua TRACE (*)    Bacteria, UA MANY (*)    All other components within normal limits  URINE CULTURE  GASTROINTESTINAL PANEL BY PCR, STOOL (REPLACES STOOL CULTURE)  C DIFFICILE QUICK SCREEN W PCR REFLEX      EKG None  Radiology CT ABDOMEN PELVIS W CONTRAST Result Date: 12/22/2023 CLINICAL DATA:  Abdominal pain, acute, nonlocalized. History of uterine/cervical cancer. EXAM: CT ABDOMEN AND PELVIS WITH CONTRAST TECHNIQUE: Multidetector CT imaging of the abdomen and pelvis was performed using the  standard protocol following bolus administration of intravenous contrast. RADIATION DOSE REDUCTION: This exam was performed according to the departmental dose-optimization program which includes automated exposure control, adjustment of the mA and/or kV according to patient size and/or use of iterative reconstruction technique. CONTRAST:  75mL OMNIPAQUE IOHEXOL 350 MG/ML SOLN COMPARISON:  CT abdomen/pelvis dated 12/10/2022. FINDINGS: Lower chest: Stable 7 mm nodule at the right lung base, essentially unchanged since April 2017, most compatible with a benign etiology. Linear atelectasis/scarring in the right middle lobe. Hepatobiliary: No suspicious focal hepatic lesion. Calcified granuloma again noted at the right hepatic lobe. Status post cholecystectomy. No biliary dilatation. Pancreas: Unremarkable. No pancreatic ductal  dilatation or surrounding inflammatory changes. Spleen: Mildly enlarged, unchanged. Adrenals/Urinary Tract: Adrenal glands are unremarkable. Stable 3.8 cm cyst arising from the superior pole of the right kidney, for which no follow-up imaging is recommended. No suspicious focal lesion. No renal calculi or hydronephrosis. Bladder is partially distended with mild circumferential bladder wall thickening. Stomach/Bowel: Stomach is within normal limits. Appendix appears normal. No evidence of bowel wall thickening, distention, or inflammatory changes. Vascular/Lymphatic: The abdominal aorta is normal in caliber with mild atherosclerotic calcification. No enlarged abdominal or pelvic lymph nodes. Reproductive: Status post hysterectomy. No adnexal masses. Other: No abdominopelvic ascites. No intraperitoneal free air. No abdominal wall hernia. Musculoskeletal: Similar chronic superior endplate compression deformity of L2. No suspicious osseous lesion. IMPRESSION: 1. Mild circumferential bladder wall thickening, concerning for cystitis. Recommend correlation with urinalysis. 2. Additional unchanged chronic  findings, as described above. 3.  Aortic Atherosclerosis (ICD10-I70.0). Electronically Signed   By: Hart Robinsons M.D.   On: 12/22/2023 13:55    Procedures Procedures    Medications Ordered in ED Medications  oxyCODONE (Oxy IR/ROXICODONE) immediate release tablet 15 mg (has no administration in time range)  venlafaxine XR (EFFEXOR-XR) 24 hr capsule 75 mg (has no administration in time range)  loperamide (IMODIUM) capsule 2 mg (has no administration in time range)  divalproex (DEPAKOTE ER) 24 hr tablet 250 mg (has no administration in time range)  gabapentin (NEURONTIN) tablet 800 mg (has no administration in time range)  acetaminophen (TYLENOL) tablet 650 mg (has no administration in time range)    Or  acetaminophen (TYLENOL) suppository 650 mg (has no administration in time range)  ondansetron (ZOFRAN) tablet 4 mg (has no administration in time range)    Or  ondansetron (ZOFRAN) injection 4 mg (has no administration in time range)  traZODone (DESYREL) tablet 25 mg (has no administration in time range)  albuterol (PROVENTIL) (2.5 MG/3ML) 0.083% nebulizer solution 2.5 mg (has no administration in time range)  enoxaparin (LOVENOX) injection 40 mg (has no administration in time range)  sodium chloride 0.9 % bolus 1,000 mL (0 mLs Intravenous Stopped 12/23/23 1015)  ondansetron (ZOFRAN) injection 4 mg (4 mg Intravenous Given 12/23/23 0836)  diphenhydrAMINE (BENADRYL) injection 25 mg (25 mg Intravenous Given 12/23/23 0946)  cefTRIAXone (ROCEPHIN) 1 g in sodium chloride 0.9 % 100 mL IVPB (0 g Intravenous Stopped 12/23/23 1015)  methylPREDNISolone sodium succinate (SOLU-MEDROL) 125 mg/2 mL injection 125 mg (125 mg Intravenous Given 12/23/23 1100)    ED Course/ Medical Decision Making/ A&P                                 Medical Decision Making Amount and/or Complexity of Data Reviewed Labs: ordered.  Risk Prescription drug management. Decision regarding hospitalization.   This patient is  a 58 y.o. female who presents to the ED for concern of abdominal pain, nausea, weakness, this involves an extensive number of treatment options, and is a complaint that carries with it a high risk of complications and morbidity. The emergent differential diagnosis prior to evaluation includes, but is not limited to,  UTI, sepsis, dehydration, AAA, gastroenteritis, appendicitis, Bowel obstruction, Bowel perforation. Gastroparesis, DKA, Hernia, Inflammatory bowel disease, mesenteric ischemia, pancreatitis, peritonitis SBP, volvulus.   This is not an exhaustive differential.   Past Medical History / Co-morbidities / Social History:  has a past medical history of Alcohol abuse, Anemia, Bipolar 1 disorder (HCC), CAP (community acquired pneumonia) (03/17/2015), History of radiation therapy (  08/24/19-09/22/19), Megaloblastic anemia (02/22/2015), Mental disorder, Obesity, PICC line infection (05/17/2015), Sepsis due to Gram negative bacteria (MDR E Coli) (02/18/2015), 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HCC), UTI (lower urinary tract infection), and Vaginal Pap smear, abnormal.   Additional history: Chart reviewed. Pertinent results include: seen by me yesterday, discharged after being given 1 dose of fosfomycin yesterday and being discharged with 2 additional doses of fosfomycin which she was instructed to take every 3 days.  She was also given a social work consult to help with SNF placement  Physical Exam: Physical exam performed. The pertinent findings include: Mild suprapubic tenderness to palpation without rebound or guarding.  Lab Tests: I ordered, and personally interpreted labs.  The pertinent results include:  no leukocytosis, UA infectious with ketones, culture pending   Imaging Studies: CT performed yesterday showed bladder wall thickening likely due to UTI.   Medications: I ordered medication including fluids, rocephin, zofran  for UTI, nausea, dehydration. Reevaluation of the patient after  these medicines showed that the patient improved. I have reviewed the patients home medicines and have made adjustments as needed.  Consultations Obtained: I requested consultation with the pharmacy on call,  and discussed lab and imaging findings as well as pertinent plan - they recommend: patient does have listed allergy to rocephin, however looks like she received this medication 54 times before having any allergic response. Chart reviewed, looks like she actually just had some hives, managed with benadryl and solu-medrol. She never received epinephrine. Given she has allergies to ciprofloxacin and sulfa as well, limited options for her given her culture sensitivities. Discussed same with patient and my attending Dr. Elayne Snare, plan to trial Rocephin to challenge allergic response. Patient understanding and is okay with proceeding with this. Will give benadryl with rocephin.    Disposition: After consideration of the diagnostic results and the patients response to treatment, I feel that patient will require admission for persistent UTI, weakness, generalized deconditioning. Patient is requesting admission as she would like to seek SNF placement. She lives alone and has no one to help care for her. Suspect she is having these issues due to her poor oral intake at home as well as limited mobility at home whereas she was getting daily PT/OT in rehab, likely also exacerbated by UTI.  Patient is understanding and in agreement with this plan.  Discussed patient with hospitalist Dr. [name] Crigler who is patient for imaging.  10:45 AM: Informed by nursing staff that the patient had called out saying that she was having itching and a rash after Rocephin infusion. No lip, tongue, or throat swelling. No shortness of breath or chest tightness. Will give Solu-medrol and continue to monitor. Hospitalist informed of same. No indication for epi at this time. Will likely need ID involvement to determine appropriate regimen for  UTI given multiple drug allergies.    I discussed this case with my attending physician Dr. Elayne Snare who cosigned this note including patient's presenting symptoms, physical exam, and planned diagnostics and interventions. Attending physician stated agreement with plan or made changes to plan which were implemented.    Final Clinical Impression(s) / ED Diagnoses Final diagnoses:  Weakness generalized  Acute cystitis with hematuria    Rx / DC Orders ED Discharge Orders     None         Silva Bandy, PA-C 12/23/23 1353    Estelle June A, DO 12/24/23 1012

## 2023-12-23 NOTE — ED Triage Notes (Signed)
 EMS reports from home, called out for abdominal pain and nausea X 3 days. Seen at [facility] yesterday for same, DX with acute Cystitis and hematuria ,Dc after fluids.   BP 140/72 HR 86 RR 18 Sp02 98 RA CBG 98

## 2023-12-23 NOTE — H&P (Signed)
 History and Physical  Jordan [name] VFI:[id number] DOB: 08/16/1966 DOA: 12/23/2023  PCP: Courtney Paris, NP   Chief Complaint: Dysuria, abdominal pain, weakness  HPI: Jordan [name] is a 58 y.o. female with medical history significant for bipolar disorder, recent hospitalization and outpatient rehab for right ankle fracture status post surgical repair being admitted to the hospital with weakness and UTI.  She was evaluated in the emergency department yesterday, workup revealed UTI she was started on fosfomycin due to her multiple allergies.  However she returned to the emergency department this morning, with complaints of low pelvic pain, dysuria, she denies any fever, nausea, vomiting, diarrhea or other complaints.  She also states that she has difficulty performing her ADLs and wants to be in rehab.  From what she tells me, seems that overall she was doing okay when she was discharged home, but over the last few weeks she has developed progressive weakness, particularly feels dizzy and lightheaded when she tries to ambulate.  Beyond that, she is not very forthcoming with details and not a good historian.  Workup in the emergency department is relatively benign, however given her low pelvic pain and dysuria, there is concern for ongoing UTI.  She has multiple drug allergies including Rocephin, however per pharmacy and ER provider she has previously received many doses of Rocephin without issue.  After discussion by ER provider with pharmacy, they have ordered one-time dose of IV Rocephin with Benadryl.  About 1 hour after receiving this, patient has developed some splotchy itchy red rash on her upper thighs and upper arms.  Vital signs have remained stable.  Review of Systems: Please see HPI for pertinent positives and negatives. A complete 10 system review of systems are otherwise negative.  Past Medical History:  Diagnosis Date   Alcohol abuse    Anemia    patient denies   Bipolar 1 disorder (HCC)     No medications currently   CAP (community acquired pneumonia) 03/17/2015   History of radiation therapy 08/24/19-09/22/19   Vaginal brachytherapy   Dr. 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   Mental disorder    Obesity    PICC line infection 05/17/2015   Sepsis due to Gram negative bacteria (MDR E Coli) 02/18/2015   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HCC)    UTI (lower urinary tract infection)    Vaginal Pap smear, abnormal    Past Surgical History:  Procedure Laterality Date   ABDOMINAL HYSTERECTOMY     CERVICAL CONIZATION W/BX N/A 07/01/2016   Procedure: CONIZATION CERVIX WITH BIOPSY;  Surgeon: Hermina Staggers, MD;  Location: [facility];  Service: Gynecology;  Laterality: N/A;   CHOLECYSTECTOMY     EXTERNAL FIXATION LEG Right 04/09/2020   Procedure: EXTERNAL FIXATION LEG;  Surgeon: Bjorn Pippin, MD;  Location: [facility];  Service: Orthopedics;  Laterality: Right;   HYSTEROSCOPY WITH D & C N/A 01/25/2016   Procedure: DILATATION AND CURETTAGE /HYSTEROSCOPY;  Surgeon: Catalina Antigua, MD;  Location: [facility];  Service: Gynecology;  Laterality: N/A;   LYMPH NODE BIOPSY Bilateral 07/22/2019   Procedure: LYMPH NODE BIOPSY;  Surgeon: Adolphus Birchwood, MD;  Location: [facility];  Service: Gynecology;  Laterality: Bilateral;   OPEN REDUCTION INTERNAL FIXATION (ORIF) FOOT LISFRANC FRACTURE Right 09/15/2023   Procedure: OPEN REDUCTION INTERNAL FIXATION (ORIF) FOOT LISFRANC FRACTURE;  Surgeon: Roby Lofts, MD;  Location: [facility];  Service: Orthopedics;  Laterality: Right;   OPEN REDUCTION INTERNAL FIXATION (ORIF) TIBIA/FIBULA FRACTURE  Right 04/10/2020   Procedure: OPEN REDUCTION INTERNAL FIXATION (ORIF) TIBIA/FIBULA FRACTURE;  Surgeon: Roby Lofts, MD;  Location: [facility];  Service: Orthopedics;  Laterality: Right;   ROBOT ASSISTED MYOMECTOMY N/A 07/22/2019   Procedure: XI ROBOTIC ASSISTED LAPAROSCOPIC RADICAL UPPER VAGINECTOMY, LEFT SALPINECTOMY, RIGHT SALPINGOOOPHERECTOMY;   Surgeon: Adolphus Birchwood, MD;  Location: [facility];  Service: Gynecology;  Laterality: N/A;   VAGINAL HYSTERECTOMY N/A 03/11/2017   Procedure: HYSTERECTOMY VAGINAL WITH MORCELLATION;  Surgeon: Hermina Staggers, MD;  Location: [facility];  Service: Gynecology;  Laterality: N/A;   Social History:  reports that she has never smoked. She has never used smokeless tobacco. She reports that she does not currently use alcohol. She reports that she does not use drugs.  Allergies  Allergen Reactions   Ceftriaxone Anaphylaxis    See progess note. Administration results in hives, throat pain, flushing and SOB.   Ciprofloxacin Swelling    Lips swell, tongue swells, face swells   Citalopram Other (See Comments)    Possible cause of pancytopenia. Swelling of tongue, face and throat   Lamotrigine Other (See Comments)    Possible cause of pancytopenia. Swelling of face, throat and tongue   Sulfa Antibiotics Anaphylaxis   Tramadol Anaphylaxis, Shortness Of Breath and Swelling    Family History  Problem Relation Age of Onset   Alcohol abuse Father    Cancer Father    Breast cancer Neg Hx      Prior to Admission medications   Medication Sig Start Date End Date Taking? Authorizing Provider  acetaminophen (TYLENOL) 325 MG tablet Take 2 tablets (650 mg total) by mouth every 6 (six) hours as needed for mild pain (pain score 1-3), fever or headache. 09/17/23   West Bali, PA-C  apixaban (ELIQUIS) 2.5 MG TABS tablet Take 1 tablet (2.5 mg total) by mouth 2 (two) times daily. 09/17/23 10/17/23  [place] Bali, PA-C  desvenlafaxine (PRISTIQ) 50 MG 24 hr tablet Take 50 mg by mouth every morning. 07/07/23   [provider]  divalproex (DEPAKOTE ER) 250 MG 24 hr tablet Take 250 mg by mouth at bedtime. 07/07/23   [provider]  fosfomycin (MONUROL) 3 g PACK Take second dose in 3 days, on 3/6 and third dose in 3 more days on 3/9 12/22/23   Smoot, Sarah A, PA-C  gabapentin (NEURONTIN) 800 MG tablet Take  800 mg by mouth 3 (three) times daily as needed (for pain). 01/11/22   [provider]  loperamide (IMODIUM) 2 MG capsule Take 1 capsule (2 mg total) by mouth every 8 (eight) hours as needed for diarrhea or loose stools. 09/29/23   Rai, Delene Ruffini, MD  methocarbamol (ROBAXIN) 500 MG tablet Take 1 tablet (500 mg total) by mouth every 6 (six) hours as needed for muscle spasms. 09/17/23   [place] Bali, PA-C  Multiple Vitamin (MULTIVITAMIN WITH MINERALS) TABS tablet Take 1 tablet by mouth daily. 09/30/23   Rai, Ripudeep K, MD  ondansetron (ZOFRAN) 4 MG tablet Take 1 tablet (4 mg total) by mouth every 6 (six) hours as needed for nausea. 09/17/23   [place] Bali, PA-C  oxyCODONE (ROXICODONE) 15 MG immediate release tablet Take 1 tablet (15 mg total) by mouth every 4 (four) hours as needed for severe pain (pain score 7-10). 09/17/23   [place] Bali, PA-C    Physical Exam: BP 131/80   Pulse 92   Resp (!) 26   LMP  (LMP Unknown)   SpO2 98%  General:  Alert, oriented, calm, in no acute distress, obese female appearing her stated age. Cardiovascular: RRR, no murmurs or rubs, no peripheral edema  Respiratory: clear to auscultation bilaterally, no wheezes, no crackles  Abdomen: soft, nontender, nondistended, normal bowel tones heard  Skin: dry, no rashes  Musculoskeletal: no joint effusions, normal range of motion  Psychiatric: appropriate affect, normal speech  Neurologic: extraocular muscles intact, clear speech, moving all extremities with intact sensorium         Labs on Admission:  Basic Metabolic Panel: Recent Labs  Lab 12/22/23 0835 12/23/23 0826  NA 138 136  K 4.0 3.6  CL 104 101  CO2 24 24  GLUCOSE 93 82  BUN 9 12  CREATININE 0.71 0.76  CALCIUM 9.2 8.8*   Liver Function Tests: Recent Labs  Lab 12/22/23 0835 12/23/23 0826  AST 26 24  ALT 16 18  ALKPHOS 123 131*  BILITOT 0.9 1.5*  PROT 7.0 7.4  ALBUMIN 3.9 4.3   Recent Labs  Lab 12/22/23 0835  LIPASE  27   No results for input(s): "AMMONIA" in the last 168 hours. CBC: Recent Labs  Lab 12/22/23 0835 12/23/23 0826  WBC 5.3 5.2  NEUTROABS 4.0 4.1  HGB 14.8 14.7  HCT 43.2 44.8  MCV 88.7 91.6  PLT 167 180   Cardiac Enzymes: No results for input(s): "CKTOTAL", "CKMB", "CKMBINDEX", "TROPONINI" in the last 168 hours. BNP (last 3 results) No results for input(s): "BNP" in the last 8760 hours.  ProBNP (last 3 results) No results for input(s): "PROBNP" in the last 8760 hours.  CBG: No results for input(s): "GLUCAP" in the last 168 hours.  Radiological Exams on Admission: CT ABDOMEN PELVIS W CONTRAST Result Date: 12/22/2023 CLINICAL DATA:  Abdominal pain, acute, nonlocalized. History of uterine/cervical cancer. EXAM: CT ABDOMEN AND PELVIS WITH CONTRAST TECHNIQUE: Multidetector CT imaging of the abdomen and pelvis was performed using the standard protocol following bolus administration of intravenous contrast. RADIATION DOSE REDUCTION: This exam was performed according to the departmental dose-optimization program which includes automated exposure control, adjustment of the mA and/or kV according to patient size and/or use of iterative reconstruction technique. CONTRAST:  75mL OMNIPAQUE IOHEXOL 350 MG/ML SOLN COMPARISON:  CT abdomen/pelvis dated 12/10/2022. FINDINGS: Lower chest: Stable 7 mm nodule at the right lung base, essentially unchanged since April 2017, most compatible with a benign etiology. Linear atelectasis/scarring in the right middle lobe. Hepatobiliary: No suspicious focal hepatic lesion. Calcified granuloma again noted at the right hepatic lobe. Status post cholecystectomy. No biliary dilatation. Pancreas: Unremarkable. No pancreatic ductal dilatation or surrounding inflammatory changes. Spleen: Mildly enlarged, unchanged. Adrenals/Urinary Tract: Adrenal glands are unremarkable. Stable 3.8 cm cyst arising from the superior pole of the right kidney, for which no follow-up imaging is  recommended. No suspicious focal lesion. No renal calculi or hydronephrosis. Bladder is partially distended with mild circumferential bladder wall thickening. Stomach/Bowel: Stomach is within normal limits. Appendix appears normal. No evidence of bowel wall thickening, distention, or inflammatory changes. Vascular/Lymphatic: The abdominal aorta is normal in caliber with mild atherosclerotic calcification. No enlarged abdominal or pelvic lymph nodes. Reproductive: Status post hysterectomy. No adnexal masses. Other: No abdominopelvic ascites. No intraperitoneal free air. No abdominal wall hernia. Musculoskeletal: Similar chronic superior endplate compression deformity of L2. No suspicious osseous lesion. IMPRESSION: 1. Mild circumferential bladder wall thickening, concerning for cystitis. Recommend correlation with urinalysis. 2. Additional unchanged chronic findings, as described above. 3.  Aortic Atherosclerosis (ICD10-I70.0). Electronically Signed   By: Maryan Char.D.  On: 12/22/2023 13:55   Assessment/Plan Jordan [name] is a 58 y.o. female with medical history significant for bipolar disorder, recent hospitalization and outpatient rehab for right ankle fracture status post surgical repair being admitted to the hospital with weakness and UTI.  UTI-without fever, leukocytosis, but has persistent lower pelvic pain, dysuria.  Received a dose of fosfomycin in the ER 3/3.  Urinalysis remains abnormal. -Inpatient admission -Follow-up urine culture obtained today 3/4 -Received a dose of empiric IV Rocephin today, but has had an allergic reaction -Nonurgent ID consult requested via secure chat to Dr. Elinor Parkinson for antibiotic recommendations  Bipolar disorder-not currently on any medication  Weakness-etiology this is unclear, she did have some hypotension on previous hospital admission. -Check orthostatic vitals every shift -PT/OT consult  DVT prophylaxis: Lovenox     Code Status: Full  Code  Consults called: None  Admission status: The appropriate patient status for this patient is INPATIENT. Inpatient status is judged to be reasonable and necessary in order to provide the required intensity of service to ensure the patient's safety. The patient's presenting symptoms, physical exam findings, and initial radiographic and laboratory data in the context of their chronic comorbidities is felt to place them at high risk for further clinical deterioration. Furthermore, it is not anticipated that the patient will be medically stable for discharge from the hospital within 2 midnights of admission.    I certify that at the point of admission it is my clinical judgment that the patient will require inpatient hospital care spanning beyond 2 midnights from the point of admission due to high intensity of service, high risk for further deterioration and high frequency of surveillance required  Time spent: 54 minutes  Anyelo Mccue Sharlette Dense MD [facility] Pager [phone]  If 7PM-7AM, please contact night-coverage [email or web address] Password [facility]  12/23/2023, 10:31 AM

## 2023-12-23 NOTE — Telephone Encounter (Signed)
 Pharmacy called related to Rx: fosfomycin dosing...EDCM reviewed chart to find that "Also discussed with ED pharmacist about antibiotic management of UTI. Patient has listed allergies to Rocephin, ciprofloxacin, and sulfa antibiotics. She has also failed fosfomycin previously. Given this, plan for 3 rounds of fosfomycin, first dose today, 2nd in 3 days, third in 3 more days."  Information relayed to Pharm D to fill as written.

## 2023-12-23 NOTE — ED Notes (Signed)
 [facility] called fro transport back to address on file

## 2023-12-23 NOTE — Consult Note (Signed)
 [facility]                                                                                        Patient Identification: Patient Name: Jordan [name] MRN: [id number] Admit Date: 12/23/2023  7:20 AM Today's Date: 12/23/2023 Reason for consult: UTI Requesting provider: Dr Lonna Duval  Active Problems:   UTI (urinary tract infection)   Antibiotics:  Fosfomycin 3/3 Ceftriaxone 3/4  Lines/Hardware:  Assessment # Probable acute uncomplicated cystitis - Unclear if truly has UTI since reports burning with urination and frequency is chronic related to weak bladder. UA with leukocytes and bacteria. 3/4 urine cx Morganella, 3/3 urine cx pending   # Rashes in the context of ceftriaxone use - mostly in her b/l arms and b/l upper thighs, faint in the distal extremities, appears improving   Recommendations  - ertapenem 1g IV daily for 3 days pending sensi of Morganella  - OP urology evaluation  - Involve social work for need of assistance at home vs SNF placement which is her primary concern - Universal/standard isolation precautions    Rest of the management as per the primary team. Please call with questions or concerns.  Thank you for the consult  __________________________________________________________________________________________________________ HPI and Hospital Course: 58 year old female with squamous cell carcinoma vagina, prior history of alcohol abuse, bipolar 1, megaloblastic anemia, obesity, recurrent UTI who initially presented to the ED on 3/3 with generalized weakness, abdominal pain associated with nausea, vomiting, diarrhea for 2 days.  No blood in the stool.  Reported dysuria but no fever or chills.  Denied recent antibiotic use.  She was just discharged from a rehab facility after ORIF for right Lisfranc fracture in 08/2023. She is currently living at home alone and requesting  social work assistance for ADLs.  She walks with a rollator at baseline since her surgery.  She was discharged from ED with possible cystitis and treated with fosfomycin due to multiple antibiotic allergies.  However she returned the following day with low pelvic pain, dysuria.  Denied fever, nausea vomiting or diarrhea or other complaints.  She was given a dose of ceftriaxone trial in the ED with benadryl but later developed itchy blotchy rashes in her b/l upper arms and b/l upper thighs and hands, which was discontinued and ID consulted for antibiotic recommendation  At ED, afebrile CBC and CMP with no significant abnormality, UA with many leukocytes and bacteria, urine cx Morganella  CT abdomen pelvis 3/3  IMPRESSION: 1. Mild circumferential bladder wall thickening, concerning for cystitis. Recommend correlation with urinalysis. 2. Additional unchanged chronic findings, as described above. 3.  Aortic Atherosclerosis (ICD10-I70.0).  She reports she came to ED due to weakness, dizziness and difficulty with getting around house for ADLs in the setting of recent rt ankle surgery. She reports chronic burning with urination and increased frequency which she reports is due to her bladder being weak. She is not able to get up in time to bathroom. She has some nausea, but denies abdominal pain, diarrhea has resolved. She is asking for help to go back to SNF.   ROS: General- Denies fever, chills, loss of  appetite and loss of weight HEENT - Denies headache, blurry vision, neck pain, sinus pain Chest - Denies any chest pain, SOB or cough CVS- Denies any dizziness/lightheadedness, syncopal attacks, palpitations Abdomen- Denies any vomiting, abdominal pain, hematochezia and diarrhea Neuro - Denies any weakness, numbness, tingling sensation Psych - Denies any changes in mood irritability or depressive symptoms GU- Denies any hematuria or urgency  Skin - denies any rashes/lesions MSK - denies any joint  pain/swelling or restricted ROM   Past Medical History:  Diagnosis Date   Alcohol abuse    Anemia    patient denies   Bipolar 1 disorder (HCC)    No medications currently   CAP (community acquired pneumonia) 03/17/2015   History of radiation therapy 08/24/19-09/22/19   Vaginal brachytherapy   Dr. 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   Mental disorder    Obesity    PICC line infection 05/17/2015   Sepsis due to Gram negative bacteria (MDR E Coli) 02/18/2015   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HCC)    UTI (lower urinary tract infection)    Vaginal Pap smear, abnormal    Past Surgical History:  Procedure Laterality Date   ABDOMINAL HYSTERECTOMY     CERVICAL CONIZATION W/BX N/A 07/01/2016   Procedure: CONIZATION CERVIX WITH BIOPSY;  Surgeon: Hermina Staggers, MD;  Location: [facility];  Service: Gynecology;  Laterality: N/A;   CHOLECYSTECTOMY     EXTERNAL FIXATION LEG Right 04/09/2020   Procedure: EXTERNAL FIXATION LEG;  Surgeon: Bjorn Pippin, MD;  Location: [facility];  Service: Orthopedics;  Laterality: Right;   HYSTEROSCOPY WITH D & C N/A 01/25/2016   Procedure: DILATATION AND CURETTAGE /HYSTEROSCOPY;  Surgeon: Catalina Antigua, MD;  Location: [facility];  Service: Gynecology;  Laterality: N/A;   LYMPH NODE BIOPSY Bilateral 07/22/2019   Procedure: LYMPH NODE BIOPSY;  Surgeon: Adolphus Birchwood, MD;  Location: [facility];  Service: Gynecology;  Laterality: Bilateral;   OPEN REDUCTION INTERNAL FIXATION (ORIF) FOOT LISFRANC FRACTURE Right 09/15/2023   Procedure: OPEN REDUCTION INTERNAL FIXATION (ORIF) FOOT LISFRANC FRACTURE;  Surgeon: Roby Lofts, MD;  Location: [facility];  Service: Orthopedics;  Laterality: Right;   OPEN REDUCTION INTERNAL FIXATION (ORIF) TIBIA/FIBULA FRACTURE Right 04/10/2020   Procedure: OPEN REDUCTION INTERNAL FIXATION (ORIF) TIBIA/FIBULA FRACTURE;  Surgeon: Roby Lofts, MD;  Location: [facility];  Service: Orthopedics;  Laterality: Right;   ROBOT ASSISTED  MYOMECTOMY N/A 07/22/2019   Procedure: XI ROBOTIC ASSISTED LAPAROSCOPIC RADICAL UPPER VAGINECTOMY, LEFT SALPINECTOMY, RIGHT SALPINGOOOPHERECTOMY;  Surgeon: Adolphus Birchwood, MD;  Location: [facility];  Service: Gynecology;  Laterality: N/A;   VAGINAL HYSTERECTOMY N/A 03/11/2017   Procedure: HYSTERECTOMY VAGINAL WITH MORCELLATION;  Surgeon: Hermina Staggers, MD;  Location: [facility];  Service: Gynecology;  Laterality: N/A;   Scheduled Meds:  enoxaparin (LOVENOX) injection  0.5 mg/kg Subcutaneous Q24H   nystatin   Topical BID   [START ON 12/24/2023] venlafaxine XR  75 mg Oral Q breakfast   Continuous Infusions: PRN Meds:.acetaminophen **OR** acetaminophen, albuterol, gabapentin, loperamide, ondansetron **OR** ondansetron (ZOFRAN) IV, oxyCODONE, traZODone  Allergies  Allergen Reactions   Ceftriaxone Anaphylaxis    See progess note. Administration results in hives, throat pain, flushing and SOB.   Ciprofloxacin Swelling    Lips swell, tongue swells, face swells   Citalopram Other (See Comments)    Possible cause of pancytopenia. Swelling of tongue, face and throat   Lamotrigine Other (See Comments)    Possible cause of pancytopenia. Swelling of  face, throat and tongue   Sulfa Antibiotics Anaphylaxis   Tramadol Anaphylaxis, Shortness Of Breath and Swelling   Social History   Socioeconomic History   Marital status: Single    Spouse name: Not on file   Number of children: Not on file   Years of education: Not on file   Highest education level: Not on file  Occupational History   Not on file  Tobacco Use   Smoking status: Never   Smokeless tobacco: Never  Vaping Use   Vaping status: Never Used  Substance and Sexual Activity   Alcohol use: Not Currently   Drug use: No   Sexual activity: Not Currently    Birth control/protection: Post-menopausal, Surgical    Comment: perimenopausal; no sex in years  Other Topics Concern   Not on file  Social History Narrative   Not on file   Social Drivers  of Health   Financial Resource Strain: Not on file  Food Insecurity: No Food Insecurity (12/23/2023)   Hunger Vital Sign    Worried About Running Out of Food in the Last Year: Never true    Ran Out of Food in the Last Year: Never true  Transportation Needs: No Transportation Needs (12/23/2023)   PRAPARE - Administrator, Civil Service (Medical): No    Lack of Transportation (Non-Medical): No  Physical Activity: Not on file  Stress: Not on file  Social Connections: Not on file  Intimate Partner Violence: Not At Risk (12/23/2023)   Humiliation, Afraid, Rape, and Kick questionnaire    Fear of Current or Ex-Partner: No    Emotionally Abused: No    Physically Abused: No    Sexually Abused: No   Family History  Problem Relation Age of Onset   Alcohol abuse Father    Cancer Father    Breast cancer Neg Hx    Vitals BP (!) 157/76 (BP Location: Right Arm)   Pulse 78   Temp 98.1 F (36.7 C) (Oral)   Resp 16   Ht 5\' 8"  (1.727 m)   Wt (!) 148.2 kg   LMP  (LMP Unknown)   SpO2 94%   BMI 49.68 kg/m    Physical Exam Constitutional:  adult female lying in the bed, not in acute distress    Comments: HEENT wnl  Cardiovascular:     Rate and Rhythm: Normal rate and regular rhythm.     Heart sounds: s1s2  Pulmonary:     Effort: Pulmonary effort is normal.     Comments: Normal breath sounds   Abdominal:     Palpations: Abdomen is soft.     Tenderness: non distended and non tender   Musculoskeletal:        General: No swelling or tenderness in peripheral joints, rt foot wound has healed with no signs of infcetion  Skin:    Comments: diffuse round erythematous macular rash in the b/l upper extremities and lower extremities including palms, rashes are fain the distal extremities.   Neurological:     General: awake, alert and oriented, grossly non focal  Psychiatric:        Mood and Affect: Mood normal.    Pertinent Microbiology Results for orders placed or performed  during the hospital encounter of 09/13/23  Surgical pcr screen     Status: None   Collection Time: 09/14/23  3:39 AM   Specimen: Nasal Mucosa; Nasal Swab  Result Value Ref Range Status   MRSA, PCR NEGATIVE NEGATIVE Final  Staphylococcus aureus NEGATIVE NEGATIVE Final    Comment: (NOTE) The Xpert SA Assay (FDA approved for NASAL specimens in patients 27 years of age and older), is one component of a comprehensive surveillance program. It is not intended to diagnose infection nor to guide or monitor treatment. Performed at [facility], [address], [place], [place] [address]    *Note: Due to a large number of results and/or encounters for the requested time period, some results have not been displayed. A complete set of results can be found in Results Review.   Pertinent Lab seen by me:    Latest Ref Rng & Units 12/24/2023    6:07 AM 12/23/2023    8:26 AM 12/22/2023    8:35 AM  CBC  WBC 4.0 - 10.5 K/uL 6.0  5.2  5.3   Hemoglobin 12.0 - 15.0 g/dL 60.4  54.0  98.1   Hematocrit 36.0 - 46.0 % 39.7  44.8  43.2   Platelets 150 - 400 K/uL 177  180  167       Latest Ref Rng & Units 12/23/2023    8:26 AM 12/22/2023    8:35 AM 09/29/2023    4:27 AM  CMP  Glucose 70 - 99 mg/dL 82  93    BUN 6 - 20 mg/dL 12  9    Creatinine 1.91 - 1.00 mg/dL 4.78  2.95  6.21   Sodium 135 - 145 mmol/L 136  138    Potassium 3.5 - 5.1 mmol/L 3.6  4.0    Chloride 98 - 111 mmol/L 101  104    CO2 22 - 32 mmol/L 24  24    Calcium 8.9 - 10.3 mg/dL 8.8  9.2    Total Protein 6.5 - 8.1 g/dL 7.4  7.0    Total Bilirubin 0.0 - 1.2 mg/dL 1.5  0.9    Alkaline Phos 38 - 126 U/L 131  123    AST 15 - 41 U/L 24  26    ALT 0 - 44 U/L 18  16     Pertinent Imagings/Other Imagings Plain films and CT images have been personally visualized and interpreted; radiology reports have been reviewed. Decision making incorporated into the Impression / Recommendations.  CT ABDOMEN PELVIS W CONTRAST Result Date:  12/22/2023 CLINICAL DATA:  Abdominal pain, acute, nonlocalized. History of uterine/cervical cancer. EXAM: CT ABDOMEN AND PELVIS WITH CONTRAST TECHNIQUE: Multidetector CT imaging of the abdomen and pelvis was performed using the standard protocol following bolus administration of intravenous contrast. RADIATION DOSE REDUCTION: This exam was performed according to the departmental dose-optimization program which includes automated exposure control, adjustment of the mA and/or kV according to patient size and/or use of iterative reconstruction technique. CONTRAST:  75mL OMNIPAQUE IOHEXOL 350 MG/ML SOLN COMPARISON:  CT abdomen/pelvis dated 12/10/2022. FINDINGS: Lower chest: Stable 7 mm nodule at the right lung base, essentially unchanged since April 2017, most compatible with a benign etiology. Linear atelectasis/scarring in the right middle lobe. Hepatobiliary: No suspicious focal hepatic lesion. Calcified granuloma again noted at the right hepatic lobe. Status post cholecystectomy. No biliary dilatation. Pancreas: Unremarkable. No pancreatic ductal dilatation or surrounding inflammatory changes. Spleen: Mildly enlarged, unchanged. Adrenals/Urinary Tract: Adrenal glands are unremarkable. Stable 3.8 cm cyst arising from the superior pole of the right kidney, for which no follow-up imaging is recommended. No suspicious focal lesion. No renal calculi or hydronephrosis. Bladder is partially distended with mild circumferential bladder wall thickening. Stomach/Bowel: Stomach is within normal limits. Appendix appears  normal. No evidence of bowel wall thickening, distention, or inflammatory changes. Vascular/Lymphatic: The abdominal aorta is normal in caliber with mild atherosclerotic calcification. No enlarged abdominal or pelvic lymph nodes. Reproductive: Status post hysterectomy. No adnexal masses. Other: No abdominopelvic ascites. No intraperitoneal free air. No abdominal wall hernia. Musculoskeletal: Similar chronic  superior endplate compression deformity of L2. No suspicious osseous lesion. IMPRESSION: 1. Mild circumferential bladder wall thickening, concerning for cystitis. Recommend correlation with urinalysis. 2. Additional unchanged chronic findings, as described above. 3.  Aortic Atherosclerosis (ICD10-I70.0). Electronically Signed   By: Hart Robinsons M.D.   On: 12/22/2023 13:55   I have personally spent 84 minutes involved in face-to-face and non-face-to-face activities for this patient on the day of the visit. Professional time spent includes the following activities: Preparing to see the patient (review of tests), Obtaining and/or reviewing separately obtained history (admission/discharge record), Performing a medically appropriate examination and/or evaluation , Ordering medications/tests/procedures, referring and communicating with other health care professionals, Documenting clinical information in the EMR, Independently interpreting results (not separately reported), Communicating results to the patient/family/caregiver, Counseling and educating the patient/family/caregiver and Care coordination (not separately reported).  Electronically signed by:   Plan d/w requesting provider as well as ID pharm D  Of note, portions of this note may have been created with voice recognition software. While this note has been edited for accuracy, occasional wrong-word or 'sound-a-like' substitutions may have occurred due to the inherent limitations of voice recognition software.   Odette Fraction, MD Infectious Disease Physician [facility] Pager: [phone]

## 2023-12-24 DIAGNOSIS — R531 Weakness: Secondary | ICD-10-CM | POA: Diagnosis not present

## 2023-12-24 DIAGNOSIS — N3001 Acute cystitis with hematuria: Secondary | ICD-10-CM | POA: Diagnosis not present

## 2023-12-24 LAB — URINE CULTURE

## 2023-12-24 LAB — CBC
HCT: 39.7 % (ref 36.0–46.0)
Hemoglobin: 13.2 g/dL (ref 12.0–15.0)
MCH: 30 pg (ref 26.0–34.0)
MCHC: 33.2 g/dL (ref 30.0–36.0)
MCV: 90.2 fL (ref 80.0–100.0)
Platelets: 177 10*3/uL (ref 150–400)
RBC: 4.4 MIL/uL (ref 3.87–5.11)
RDW: 12.7 % (ref 11.5–15.5)
WBC: 6 10*3/uL (ref 4.0–10.5)
nRBC: 0 % (ref 0.0–0.2)

## 2023-12-24 LAB — BASIC METABOLIC PANEL
Anion gap: 9 (ref 5–15)
BUN: 13 mg/dL (ref 6–20)
CO2: 23 mmol/L (ref 22–32)
Calcium: 8.6 mg/dL — ABNORMAL LOW (ref 8.9–10.3)
Chloride: 102 mmol/L (ref 98–111)
Creatinine, Ser: 0.58 mg/dL (ref 0.44–1.00)
GFR, Estimated: >60 mL/min (ref >60)
Glucose, Bld: 103 mg/dL — ABNORMAL HIGH (ref 70–99)
Potassium: 3.2 mmol/L — ABNORMAL LOW (ref 3.5–5.1)
Sodium: 134 mmol/L — ABNORMAL LOW (ref 135–145)

## 2023-12-24 MED ORDER — POTASSIUM CHLORIDE CRYS ER 20 MEQ PO TBCR
40.0000 meq | EXTENDED_RELEASE_TABLET | Freq: Once | ORAL | Status: AC
  Administered 2023-12-24: 40 meq via ORAL
  Filled 2023-12-24: qty 2

## 2023-12-24 MED ORDER — CEFAZOLIN SODIUM-DEXTROSE 1-4 GM/50ML-% IV SOLN
1.0000 g | Freq: Three times a day (TID) | INTRAVENOUS | Status: DC
  Filled 2023-12-24: qty 50

## 2023-12-24 MED ORDER — SODIUM CHLORIDE 0.9 % IV SOLN
1.0000 g | Freq: Every day | INTRAVENOUS | Status: AC
  Administered 2023-12-24 – 2023-12-26 (×3): 1 g via INTRAVENOUS
  Filled 2023-12-24 (×3): qty 1000

## 2023-12-24 NOTE — Evaluation (Signed)
 Occupational Therapy Evaluation Patient Details Name: Jordan [name] MRN: [id number] DOB: 1966-02-05 Today's Date: 12/24/2023   History of Present Illness   Pt is 58 yo female admitted on 12/23/23 with weakness and UTI.  Pt also found to have hypokalemia.  Pt with hx including but not limited to bipolar disorder, recent (12/24) R ankle fx, ETOH abuse, squamous cell CA of vagina with hysterectomy     Clinical Impressions Pt presents with decline in function and safety with ADLs and ADL mobility with impaired strength, balance and endurance. PTA pt lived alone and was Ind prior to fall in December - Ind with ADLs and IADLs, home mgt, cooking, does not drive , since December struggling due to lightheadedness/weakness; was at [facility] for [facility]. Walks with a Product manager. Pt declined sitting EOB and OOB activity with OT (just worked with PT). Pt agreeable to rolling in bed and seated bed level activity. Pt requires set up with grooming and UB ADLs, extensive assist with LB ADLs and toileting. Per PT note, pt required mod A sit - stand. Recommend short term rehab at [facility] after acute stay d/c. OT wil follow acutely to maximize level of function and safety     If plan is discharge home, recommend the following:   A lot of help with bathing/dressing/bathroom;A lot of help with walking and/or transfers;Assistance with cooking/housework;Assist for transportation;Help with stairs or ramp for entrance     Functional Status Assessment   Patient has had a recent decline in their functional status and demonstrates the ability to make significant improvements in function in a reasonable and predictable amount of time.     Equipment Recommendations    (bariatris shower chair)     Recommendations for Other Services         Precautions/Restrictions   Precautions Precautions: Fall Restrictions Weight Bearing Restrictions Per Provider Order: No     Mobility Bed Mobility Overal bed mobility: Needs  Assistance Bed Mobility: Rolling Rolling: Supervision         General bed mobility comments: pt initially areeable ot sit EOB and standing, then changed her mind stating that she "feels too comfortable right now    Transfers                   General transfer comment: pt declined. Per PT note, pt mod A sit - stand. Pt reports that she walked with NTs to bathroom today.      Balance       Sitting balance - Comments: NT       Standing balance comment: NT                           ADL either performed or assessed with clinical judgement   ADL Overall ADL's : Needs assistance/impaired Eating/Feeding: Independent;Sitting;Bed level   Grooming: Wash/dry hands;Wash/dry face;Set up;Sitting;Bed level   Upper Body Bathing: Supervision/ safety;Set up;Sitting;Bed level Upper Body Bathing Details (indicate cue type and reason): simulated Lower Body Bathing: Maximal assistance;Bed level   Upper Body Dressing : Supervision/safety;Set up;Sitting;Bed level   Lower Body Dressing: Maximal assistance         Toileting - Clothing Manipulation Details (indicate cue type and reason): using PureWick, reports that she walked to bathroom with NT today with assist required for toileting (unable to quantify)       General ADL Comments: pt declined OOB activity     Vision Ability to See in Adequate Light: 0 Adequate Patient  Visual Report: No change from baseline       Perception         Praxis         Pertinent Vitals/Pain Pain Assessment Pain Assessment: No/denies pain     Extremity/Trunk Assessment Upper Extremity Assessment Upper Extremity Assessment: Overall WFL for tasks assessed   Lower Extremity Assessment Lower Extremity Assessment: Defer to PT evaluation RLE Deficits / Details: ROM WFL; MMT 4/5 LLE Deficits / Details: ROM WFL; MMT 4/5   Cervical / Trunk Assessment Cervical / Trunk Assessment: Normal   Communication     Cognition Arousal:  Alert Behavior During Therapy: WFL for tasks assessed/performed Cognition: No apparent impairments             OT - Cognition Comments: pleasant, talkative                 Following commands: Intact       Cueing  General Comments          Exercises     Shoulder Instructions      Home Living Family/patient expects to be discharged to:: Private residence Living Arrangements: Alone Available Help at Discharge: Neighbor;Available PRN/intermittently Type of Home: Apartment Home Access: Level entry     Home Layout: One level     Bathroom Shower/Tub: Producer, television/film/video: Standard Bathroom Accessibility: Yes   Home Equipment: BSC/3in1;Rollator (4 wheels)   Additional Comments: Reports had shower seat but now too small and that she bathes as sink      Prior Functioning/Environment Prior Level of Function : Independent/Modified Independent             Mobility Comments: Has been home from SNF for a few weeks.  States at SNF walking with rollator and doing well but since home struggling b/c dizzy/lightheaded; near fall but no actual fall ADLs Comments: Pt was Ind prior to fall in December - Ind with ADLs and IADLs, home mt, cooking, does not drive .  Since December struggling due to lightheadedness/weakness.    OT Problem List: Decreased strength;Impaired balance (sitting and/or standing);Decreased activity tolerance;Decreased coordination;Decreased knowledge of use of DME or AE   OT Treatment/Interventions: Self-care/ADL training;Therapeutic activities;DME and/or AE instruction;Balance training;Patient/family education;Energy conservation      OT Goals(Current goals can be found in the care plan section)   Acute Rehab OT Goals Patient Stated Goal: get better OT Goal Formulation: With patient Time For Goal Achievement: 01/07/24 Potential to Achieve Goals: Good ADL Goals Pt Will Perform Grooming: with min assist;with contact guard  assist;standing Pt Will Perform Lower Body Bathing: with mod assist;sitting/lateral leans Pt Will Perform Lower Body Dressing: with mod assist;sitting/lateral leans Pt Will Transfer to Toilet: with min assist;ambulating;regular height toilet;grab bars Pt Will Perform Toileting - Clothing Manipulation and hygiene: with max assist;with mod assist;sitting/lateral leans;sit to/from stand   OT Frequency:  Min 2X/week    Co-evaluation              AM-PAC OT "6 Clicks" Daily Activity     Outcome Measure Help from another person eating meals?: None Help from another person taking care of personal grooming?: A Little Help from another person toileting, which includes using toliet, bedpan, or urinal?: A Lot Help from another person bathing (including washing, rinsing, drying)?: A Lot Help from another person to put on and taking off regular upper body clothing?: A Little Help from another person to put on and taking off regular lower body clothing?: A Lot 6 Click  Score: 16   End of Session    Activity Tolerance: Patient limited by fatigue Patient left: in bed;with call bell/phone within reach;with bed alarm set  OT Visit Diagnosis: Other abnormalities of gait and mobility (R26.89);Muscle weakness (generalized) (M62.81);Dizziness and giddiness (R42)                Time: 4098-1191 OT Time Calculation (min): 24 min Charges:  OT General Charges $OT Visit: 1 Visit OT Evaluation $OT Eval Low Complexity: 1 Low OT Treatments $Therapeutic Activity: 8-22 mins    Galen Manila 12/24/2023, 4:51 PM

## 2023-12-24 NOTE — Plan of Care (Signed)
   Problem: Education: Goal: Knowledge of General Education information will improve Description: Including pain rating scale, medication(s)/side effects and non-pharmacologic comfort measures Outcome: Progressing   Problem: Clinical Measurements: Goal: Will remain free from infection Outcome: Progressing Goal: Diagnostic test results will improve Outcome: Progressing   Problem: Elimination: Goal: Will not experience complications related to urinary retention Outcome: Progressing

## 2023-12-24 NOTE — Plan of Care (Signed)

## 2023-12-24 NOTE — Evaluation (Signed)
 Physical Therapy Evaluation Patient Details Name: Jordan [name] MRN: [id number] DOB: June 26, 1966 Today's Date: 12/24/2023  History of Present Illness  Pt is 58 yo female admitted on 12/23/23 with weakness and UTI.  Pt also found to have hypokalemia.  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 At baseline pt lived alone.  She reports prior to fracture and hospitalization in December was completely independent.  She went to SNF for several weeks and reports doing well and ambulatory with rollator; however, upon return home developed nausea, dizziness, lightheadedness for several weeks that worsened until unable to mobilize and fearful of falling.  Today, pt required mod A to stand , was very shakey, and only able to take a few side steps.  She did ambulate to bathroom earlier with tech but reports felt unsteady. Her orthostatic BP were negative.  [employer] and Horizontal Roll negative and symptoms not really consistent with BPPV as symptoms are constant.  No signs of central cause for dizziness (negative HINTS).  Potentially some vestibular hypofunction (+head thrust, slow gaze stabilization).  Will benefit from acute PT services. Pt currently with functional limitations due to the deficits listed below (see PT Problem List). Pt will benefit from acute skilled PT to increase their independence and safety with mobility to allow discharge.  Due to poor balance and level of assist required Patient will benefit from continued inpatient follow up therapy, <3 hours/day at d/c.          If plan is discharge home, recommend the following: A lot of help with walking and/or transfers;A lot of help with bathing/dressing/bathroom   Can travel by private vehicle   Yes    Equipment Recommendations Wheelchair cushion (measurements PT);Wheelchair (measurements PT)   Recommendations for Other Services       Functional Status Assessment Patient has had a recent decline in their functional status and demonstrates the ability to make significant improvements in function in a reasonable and predictable amount of time.     Precautions / Restrictions Precautions Precautions: Fall      Mobility  Bed Mobility Overal bed mobility: Needs Assistance Bed Mobility: Supine to Sit, Sit to Supine     Supine to sit: Supervision, HOB elevated, Used rails Sit to supine: Supervision, HOB elevated, Used rails        Transfers Overall transfer level: Needs assistance Equipment used: Rolling walker (2 wheels) Transfers: Sit to/from Stand Sit to Stand: From elevated surface, Mod assist           General transfer comment: Mod A to stand and steady    Ambulation/Gait Ambulation/Gait assistance: Min assist Gait Distance (Feet): 2 Feet Assistive device: Rolling walker (2 wheels) Gait Pattern/deviations: Step-to pattern, Decreased stride length Gait velocity: decreased     General Gait Details: side steps at EOB: shakey needing min A for RW and balance; fatigued easily  Stairs            Wheelchair Mobility     Tilt Bed    Modified Rankin (Stroke Patients Only)       Balance Overall balance assessment: Needs assistance Sitting-balance support: No upper extremity supported Sitting balance-Leahy Scale: Fair     Standing balance support: Bilateral upper extremity supported Standing balance-Leahy Scale: Poor  Pertinent Vitals/Pain Pain Assessment Pain Assessment: No/denies pain    Home Living Family/patient expects to be discharged to:: Private residence Living Arrangements: Alone Available Help at Discharge: Neighbor;Available PRN/intermittently (limited) Type of Home: Apartment Home Access: Level entry       Home Layout: One level Home Equipment: BSC/3in1;Rollator (4  wheels) Additional Comments: Reports had shower seat but now too small    Prior Function Prior Level of Function : Independent/Modified Independent             Mobility Comments: Has been home from SNF for a few weeks.  States at SNF walking with rollator and doing well but since home struggling b/c dizzy/lightheaded; near fall but no actual fall ADLs Comments: Pt was independent prior to fall in December - independent with adls and iadls .  Since December struggling due to lightheadedness/weakness.     Extremity/Trunk Assessment   Upper Extremity Assessment Upper Extremity Assessment: Overall WFL for tasks assessed    Lower Extremity Assessment Lower Extremity Assessment: LLE deficits/detail;RLE deficits/detail RLE Deficits / Details: ROM WFL; MMT 4/5 LLE Deficits / Details: ROM WFL; MMT 4/5    Cervical / Trunk Assessment Cervical / Trunk Assessment: Normal  Communication        Cognition Arousal: Alert Behavior During Therapy: WFL for tasks assessed/performed   PT - Cognitive impairments: No apparent impairments                       PT - Cognition Comments: Pt alert and oriented; provided detailed history that seemed consistent with documentation; tangential at times         Cueing       General Comments   Vestibular:  History: Pt had c/o lightheadedness and dizziness at admission.  She reports doing great at [facility] and walking no issues with dizzy/lightheadedness.  Went home and dizziness/lightheadedness and nausea developed a few days after being home and going on ~3 weeks.  Reports dizzy/lightheadedness nearly constant, sometimes is ok if still but tends to occur in bed, sitting, standing, and mobilizing.  Objective:  Orthostatic BP were Negative:  supine 131/69 with HR 80; Sitting 145/73 with HR 88; Standing 152/88 HR 89  Resting Nystagmus: Negative Gaze Induced Nystagmus: Negative EOEM/smooth pursuit: Intact Gaze Stabilization: slow movement due  to dizzy but intact Head Thrust: Dizzy and Positive Test of Skew: Horizontal correction bil but negative vertical correction [place] Dizzy: Dizzy  but no worse , no nystagmus, symptoms not consistent with BPPV Horizontal Roll: Dizzy  but no worse , no nystagmus, symptoms not consistent with BPPV  Educated on compensation with focus points and encouraged to perform gaze stabilization exercises throughout the day for habituation.    Exercises     Assessment/Plan    PT Assessment Patient needs continued PT services  PT Problem List Decreased strength;Pain;Decreased range of motion;Decreased activity tolerance;Decreased balance;Decreased mobility;Decreased knowledge of use of DME       PT Treatment Interventions DME instruction;Therapeutic exercise;Gait training;Stair training;Functional mobility training;Therapeutic activities;Patient/family education;Balance training;Neuromuscular re-education    PT Goals (Current goals can be found in the Care Plan section)  Acute Rehab PT Goals Patient Stated Goal: "back to normal, feel like doing things" PT Goal Formulation: With patient Time For Goal Achievement: 01/07/24 Potential to Achieve Goals: Good    Frequency Min 2X/week     Co-evaluation               AM-PAC PT "6 Clicks" Mobility  Outcome Measure Help  needed turning from your back to your side while in a flat bed without using bedrails?: A Little Help needed moving from lying on your back to sitting on the side of a flat bed without using bedrails?: A Little Help needed moving to and from a bed to a chair (including a wheelchair)?: A Little Help needed standing up from a chair using your arms (e.g., wheelchair or bedside chair)?: A Lot Help needed to walk in hospital room?: Total Help needed climbing 3-5 steps with a railing? : Total 6 Click Score: 13    End of Session Equipment Utilized During Treatment: Gait belt Activity Tolerance: Patient limited by fatigue Patient  left: in bed;with call bell/phone within reach;with bed alarm set Nurse Communication: Mobility status PT Visit Diagnosis: Other abnormalities of gait and mobility (R26.89);Muscle weakness (generalized) (M62.81)    Time: 1610-9604 PT Time Calculation (min) (ACUTE ONLY): 43 min   Charges:   PT Evaluation $PT Eval Low Complexity: 1 Low PT Treatments $Therapeutic Activity: 23-37 mins PT General Charges $$ ACUTE PT VISIT: 1 Visit         Anise Salvo, PT Acute Rehab [facility] [phone]   Rayetta Humphrey 12/24/2023, 3:56 PM

## 2023-12-24 NOTE — Progress Notes (Signed)
 PROGRESS NOTE    Jordan [name]  ZOX:[id number] DOB: 31-May-1966 DOA: 12/23/2023 PCP: Courtney Paris, NP  Chief Complaint  Patient presents with   Abdominal Pain   Nausea    Brief Narrative:   Jordan [name] is Jordan [name] 58 y.o. female with medical history significant for bipolar disorder, recent hospitalization and outpatient rehab for right ankle fracture status post surgical repair being admitted to the hospital with weakness and UTI.   Assessment & Plan:   Active Problems:   UTI (urinary tract infection)  UTI-without fever, leukocytosis, but has persistent lower pelvic pain, dysuria.  Received Jaun Galluzzo dose of fosfomycin in the ER 3/3.  Urinalysis remains abnormal. -Inpatient admission -Follow-up urine culture with morganella morganii awaiting sensitivities -continue invanz, appreciate ID recs   Diffuse Rash - allergic reaction to ceftriaxone (on allergy list).  Could be fixed drug eruption?  - will continue to monitor   Generalized Weakness - presumed related to UTI above - orthostatics - PT/OT - monitor response on abx  Hypokalemia Replace and follo  Bipolar disorder-not currently on any medication     DVT prophylaxis: Lovenox Code Status: full Family Communication: none Disposition:   Status is: Inpatient Remains inpatient appropriate because: need for ongoing inpatient care   Consultants:  ID  Procedures:  none  Antimicrobials:  Anti-infectives (From admission, onward)    Start     Dose/Rate Route Frequency Ordered Stop   12/24/23 1100  ertapenem (INVANZ) 1 g in sodium chloride 0.9 % 100 mL IVPB        1 g 200 mL/hr over 30 Minutes Intravenous Daily 12/24/23 1014 12/27/23 0959   12/24/23 1030  ceFAZolin (ANCEF) IVPB 1 g/50 mL premix  Status:  Discontinued        1 g 100 mL/hr over 30 Minutes Intravenous Every 8 hours 12/24/23 0941 12/24/23 1014   12/24/23 0900  cefTRIAXone (ROCEPHIN) 1 g in sodium chloride 0.9 % 100 mL IVPB  Status:  Discontinued        1 g 200  mL/hr over 30 Minutes Intravenous Every 24 hours 12/23/23 1026 12/23/23 1047   12/23/23 0945  cefTRIAXone (ROCEPHIN) 1 g in sodium chloride 0.9 % 100 mL IVPB        1 g 200 mL/hr over 30 Minutes Intravenous  Once 12/23/23 0938 12/23/23 1015       Subjective: C/o feeling poorly   Objective: Vitals:   12/23/23 1702 12/23/23 2116 12/24/23 0258 12/24/23 1334  BP: (!) 178/96 135/67 (!) 157/76 (!) 149/70  Pulse: 81 81 78 80  Resp: 18 20 16 18   Temp: 99.2 F (37.3 C) 98.1 F (36.7 C) 98.1 F (36.7 C) 98.7 F (37.1 C)  TempSrc: Oral  Oral   SpO2: 93% 96% 94% 96%  Weight: (!) 148.2 kg     Height: 5\' 8"  (1.727 m)       Intake/Output Summary (Last 24 hours) at 12/24/2023 1349 Last data filed at 12/24/2023 1222 Gross per 24 hour  Intake 480 ml  Output 425 ml  Net 55 ml   Filed Weights   12/23/23 1702  Weight: (!) 148.2 kg    Examination:  General exam: obese, appears mildly uncomfortable Respiratory system: unlabored Cardiovascular system: RRR Gastrointestinal system: Abdomen is nondistended, soft and nontender. Central nervous system: Alert and oriented. No focal neurological deficits. Diffuse round erythematous patches, blanching - including palms - on extremities Extremities: no LEE    Data Reviewed: I have personally reviewed following labs and imaging studies  CBC: Recent Labs  Lab 12/22/23 0835 12/23/23 0826 12/24/23 0607  WBC 5.3 5.2 6.0  NEUTROABS 4.0 4.1  --   HGB 14.8 14.7 13.2  HCT 43.2 44.8 39.7  MCV 88.7 91.6 90.2  PLT 167 180 177    Basic Metabolic Panel: Recent Labs  Lab 12/22/23 0835 12/23/23 0826 12/24/23 0607  NA 138 136 134*  K 4.0 3.6 3.2*  CL 104 101 102  CO2 24 24 23   GLUCOSE 93 82 103*  BUN 9 12 13   CREATININE 0.71 0.76 0.58  CALCIUM 9.2 8.8* 8.6*    GFR: Estimated Creatinine Clearance: 122.4 mL/min (by C-G formula based on SCr of 0.58 mg/dL).  Liver Function Tests: Recent Labs  Lab 12/22/23 0835 12/23/23 0826  AST 26  24  ALT 16 18  ALKPHOS 123 131*  BILITOT 0.9 1.5*  PROT 7.0 7.4  ALBUMIN 3.9 4.3    CBG: No results for input(s): "GLUCAP" in the last 168 hours.   Recent Results (from the past 240 hours)  Urine Culture     Status: Abnormal   Collection Time: 12/22/23  2:48 PM   Specimen: Urine, Clean Catch  Result Value Ref Range Status   Specimen Description URINE, CLEAN CATCH  Final   Special Requests   Final    NONE Performed at [facility], [address], [place], [place] [address]    Culture MULTIPLE SPECIES PRESENT, SUGGEST RECOLLECTION (Bianey Tesoro)  Final   Report Status 12/24/2023 FINAL  Final  Urine Culture     Status: Abnormal (Preliminary result)   Collection Time: 12/23/23  7:58 AM   Specimen: Urine, Clean Catch  Result Value Ref Range Status   Specimen Description   Final    URINE, CLEAN CATCH Performed at [facility], [address] [place], [place] [address]    Special Requests   Final    NONE Performed at [facility], [address], [place], [place] [address]    Culture (Lacresha Fusilier)  Final    >=100,000 COLONIES/mL MORGANELLA MORGANII CULTURE REINCUBATED FOR BETTER GROWTH SUSCEPTIBILITIES TO FOLLOW Performed at [facility], [address], [place], [place] [address]    Report Status PENDING  Incomplete         Radiology Studies: No results found.      Scheduled Meds:  enoxaparin (LOVENOX) injection  0.5 mg/kg Subcutaneous Q24H   nystatin   Topical BID   venlafaxine XR  75 mg Oral Q breakfast   Continuous Infusions:  ertapenem 1 g (12/24/23 1148)     LOS: 1 day    Time spent: over 30 min    Lacretia Nicks, MD [facility]   To contact the attending provider between 7A-7P or the covering provider during after hours 7P-7A, please log into the web site [email or web address] and access using universal [place] password for that web site. If you do not have the password, please call the hospital  operator.  12/24/2023, 1:49 PM

## 2023-12-25 ENCOUNTER — Inpatient Hospital Stay (HOSPITAL_COMMUNITY): Payer: MEDICAID

## 2023-12-25 DIAGNOSIS — R531 Weakness: Secondary | ICD-10-CM | POA: Diagnosis not present

## 2023-12-25 LAB — COMPREHENSIVE METABOLIC PANEL
ALT: 14 U/L (ref 0–44)
AST: 24 U/L (ref 15–41)
Albumin: 3.4 g/dL — ABNORMAL LOW (ref 3.5–5.0)
Alkaline Phosphatase: 97 U/L (ref 38–126)
Anion gap: 10 (ref 5–15)
BUN: 18 mg/dL (ref 6–20)
CO2: 24 mmol/L (ref 22–32)
Calcium: 8.5 mg/dL — ABNORMAL LOW (ref 8.9–10.3)
Chloride: 106 mmol/L (ref 98–111)
Creatinine, Ser: 0.65 mg/dL (ref 0.44–1.00)
GFR, Estimated: >60 mL/min (ref >60)
Glucose, Bld: 98 mg/dL (ref 70–99)
Potassium: 4.2 mmol/L (ref 3.5–5.1)
Sodium: 140 mmol/L (ref 135–145)
Total Bilirubin: 0.6 mg/dL (ref 0.0–1.2)
Total Protein: 5.9 g/dL — ABNORMAL LOW (ref 6.5–8.1)

## 2023-12-25 LAB — MAGNESIUM: Magnesium: 2.1 mg/dL (ref 1.7–2.4)

## 2023-12-25 LAB — CBC WITH DIFFERENTIAL/PLATELET
Abs Immature Granulocytes: 0.01 10*3/uL (ref 0.00–0.07)
Basophils Absolute: 0 10*3/uL (ref 0.0–0.1)
Basophils Relative: 0 %
Eosinophils Absolute: 0 10*3/uL (ref 0.0–0.5)
Eosinophils Relative: 0 %
HCT: 45 % (ref 36.0–46.0)
Hemoglobin: 14.7 g/dL (ref 12.0–15.0)
Immature Granulocytes: 0 %
Lymphocytes Relative: 30 %
Lymphs Abs: 0.9 10*3/uL (ref 0.7–4.0)
MCH: 30.1 pg (ref 26.0–34.0)
MCHC: 32.7 g/dL (ref 30.0–36.0)
MCV: 92 fL (ref 80.0–100.0)
Monocytes Absolute: 0.3 10*3/uL (ref 0.1–1.0)
Monocytes Relative: 12 %
Neutro Abs: 1.6 10*3/uL — ABNORMAL LOW (ref 1.7–7.7)
Neutrophils Relative %: 58 %
Platelets: 105 10*3/uL — ABNORMAL LOW (ref 150–400)
RBC: 4.89 MIL/uL (ref 3.87–5.11)
RDW: 13.3 % (ref 11.5–15.5)
WBC: 2.9 10*3/uL — ABNORMAL LOW (ref 4.0–10.5)
nRBC: 0 % (ref 0.0–0.2)

## 2023-12-25 LAB — PHOSPHORUS: Phosphorus: 4.4 mg/dL (ref 2.5–4.6)

## 2023-12-25 MED ORDER — LORAZEPAM 2 MG/ML IJ SOLN
0.5000 mg | Freq: Once | INTRAMUSCULAR | Status: AC | PRN
  Administered 2023-12-25: 0.5 mg via INTRAVENOUS
  Filled 2023-12-25: qty 1

## 2023-12-25 NOTE — Progress Notes (Signed)
 ID brief note  Remains afebrile Labs with mild leukopenia and thrombocytopenia      Latest Ref Rng & Units 12/25/2023    5:59 AM 12/24/2023    6:07 AM 12/23/2023    8:26 AM  CBC  WBC 4.0 - 10.5 K/uL 2.9  6.0  5.2   Hemoglobin 12.0 - 15.0 g/dL 46.9  62.9  52.8   Hematocrit 36.0 - 46.0 % 45.0  39.7  44.8   Platelets 150 - 400 K/uL 105  177  180       Latest Ref Rng & Units 12/25/2023    5:59 AM 12/24/2023    6:07 AM 12/23/2023    8:26 AM  CMP  Glucose 70 - 99 mg/dL 98  413  82   BUN 6 - 20 mg/dL 18  13  12    Creatinine 0.44 - 1.00 mg/dL 2.44  0.10  2.72   Sodium 135 - 145 mmol/L 140  134  136   Potassium 3.5 - 5.1 mmol/L 4.2  3.2  3.6   Chloride 98 - 111 mmol/L 106  102  101   CO2 22 - 32 mmol/L 24  23  24    Calcium 8.9 - 10.3 mg/dL 8.5  8.6  8.8   Total Protein 6.5 - 8.1 g/dL 5.9   7.4   Total Bilirubin 0.0 - 1.2 mg/dL 0.6   1.5   Alkaline Phos 38 - 126 U/L 97   131   AST 15 - 41 U/L 24   24   ALT 0 - 44 U/L 14   18    3/3 urine cx multiple sp 3/4 urine cx serratia marcescens, S to Imipenem  Complete 3 days of ertapenem as planned, EOT 12/26/23  ID will so, please recall back if needed, questions   Odette Fraction, MD Infectious Disease Physician [facility] [address] [place], [place] [address] Phone: [phone]  Fax: [phone]

## 2023-12-25 NOTE — TOC Initial Note (Signed)
 Transition of Care [facility]) - Initial/Assessment Note    Patient Details  Name: Jordan [name] MRN: [id number] Date of Birth: [birth year]  [facility] CM/SW Contact:    Otelia Santee, LCSW Phone Number: 12/25/2023, 11:01 AM  Clinical Narrative:                 Pt from home alone. Pt has BSC and rollator she uses at baseline. Pt agreeable to SNF recommendation and reports she was recently at [facility]. Pt unsure of how long she was at [facility]. CSW discussed possibility of insurance not covering further stay depending on how many days she was at [facility] previously.  PASRR has been requested and is currently under review. H&P, FL-2, and progress notes have been uploaded for review.  Referrals have been sent for SNF placement and currently awaiting bed offers.   Expected Discharge Plan: Skilled Nursing Facility Barriers to Discharge: Awaiting State Approval [facility]), SNF Pending bed offer   Patient Goals and CMS Choice Patient states their goals for this hospitalization and ongoing recovery are:: To go to SNF          Expected Discharge Plan and Services In-house Referral: Clinical Social Work Discharge Planning Services: NA Post Acute Care Choice: Skilled Nursing Facility Living arrangements for the past 2 months: Apartment                 DME Arranged: N/A DME Agency: NA                  Prior Living Arrangements/Services Living arrangements for the past 2 months: Apartment Lives with:: Self Patient language and need for interpreter reviewed:: Yes Do you feel safe going back to the place where you live?: Yes      Need for Family Participation in Patient Care: No (Comment) Care giver support system in place?: No (comment) Current home services: DME (BSC, rollator) Criminal Activity/Legal Involvement Pertinent to Current Situation/Hospitalization: No - Comment as needed  Activities of Daily Living   ADL Screening (condition at time of  admission) Independently performs ADLs?: Yes (appropriate for developmental age) Is the patient deaf or have difficulty hearing?: No Does the patient have difficulty seeing, even when wearing glasses/contacts?: No Does the patient have difficulty concentrating, remembering, or making decisions?: No  Permission Sought/Granted Permission sought to share information with : Facility Industrial/product designer granted to share information with : Yes, Verbal Permission Granted     Permission granted to share info w AGENCY: SNF's        Emotional Assessment Appearance:: Appears stated age Attitude/Demeanor/Rapport: Engaged Affect (typically observed): Accepting Orientation: : Oriented to Self, Oriented to Place, Oriented to  Time, Oriented to Situation Alcohol / Substance Use: Not Applicable Psych Involvement: No (comment)  Admission diagnosis:  UTI (urinary tract infection) [N39.0] Weakness generalized [R53.1] Acute cystitis with hematuria [N30.01] Patient Active Problem List   Diagnosis Date Noted   UTI (urinary tract infection) 12/23/2023   Closed fracture of right tibia and fibula 09/13/2023   Closed displaced fracture of first metatarsal bone of right foot 09/13/2023   Thoracic radiculitis 02/10/2023   Schizophrenia (HCC) 02/10/2023   Pain in thoracic spine 02/10/2023   Moderate intellectual disability 02/10/2023   Lumbar radiculopathy 02/10/2023   Chronic pain 02/10/2023   Cystitis 01/16/2023   Dysuria 12/12/2022   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   Vitamin B12 deficiency 08/19/2022   Complicated UTI (urinary tract infection) 08/17/2022   Grief 04/17/2022   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  Menopausal symptoms 05/02/2021   Urge incontinence of urine 12/26/2020   Leukopenia    Vitamin D deficiency    Acute lower UTI    Morbid obesity with BMI of 40.0-44.9, adult (HCC) 06/11/2019   Vaginal cancer (HCC) 05/28/2019   Visit for routine gyn exam 04/21/2019   Vaginal atrophy  04/21/2019   Urinary tract infection with hematuria 07/01/2015   Diastolic dysfunction 07/01/2015   Constipation 05/12/2015   ESBL (extended spectrum beta-lactamase) producing bacteria infection 03/17/2015   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 [place] [address] Phone: [phone] Fax: [phone]  [place] [facility] [address] [place] [address] Phone: [phone] Fax: [phone]     Social Drivers of Health (SDOH) Social History: SDOH Screenings   Food Insecurity: No Food Insecurity (12/23/2023)  Housing: Low Risk  (12/23/2023)  Transportation Needs: No Transportation Needs (12/23/2023)  Utilities: Not At Risk (12/23/2023)  Depression (PHQ2-9): Low Risk  (07/16/2023)  Tobacco Use: Low Risk  (12/23/2023)   SDOH Interventions:     Readmission Risk Interventions    12/25/2023   10:59 AM 08/22/2022    3:08 PM 08/19/2022   11:26 AM  Readmission Risk Prevention Plan  Post Dischage Appt   Complete  Medication Screening   Complete  Transportation Screening Complete Complete Complete  PCP or Specialist Appt within 5-7 Days Complete Complete   Home Care Screening Complete Complete   Medication Review (RN CM) Complete Complete

## 2023-12-25 NOTE — Plan of Care (Signed)

## 2023-12-25 NOTE — Progress Notes (Signed)
 PROGRESS NOTE    Jordan [name]  HQI:[id number] DOB: 08-17-1966 DOA: 12/23/2023 PCP: Courtney Paris, NP  Chief Complaint  Patient presents with   Abdominal Pain   Nausea    Brief Narrative:   Jordan [name] is Jordan [name] 58 y.o. female with medical history significant for bipolar disorder, recent hospitalization and outpatient rehab for right ankle fracture status post surgical repair being admitted to the hospital with weakness and UTI.   Assessment & Plan:   Active Problems:   UTI (urinary tract infection)  Morganella Morganii UTI-without fever, leukocytosis, but has persistent lower pelvic pain, dysuria.  Received Jordan [name] dose of fosfomycin in the ER 3/3.  Urinalysis remains abnormal. -Inpatient admission -Follow-up urine culture with morganella morganii awaiting sensitivities (sensitive to imipenem) -continue invanz, appreciate ID recs   Dizziness - not clear if this is vertigo or lightheadedness (she mentions Jordan [name] little of both?) - I don't see any orthostatics done (will discuss with RN) - will consider MRI brain  - appreciated continued therapy eval   Generalized Weakness - presumed related to UTI above - orthostatics (pending) - PT/OT -> recommending SNF - monitor response on abx  Diffuse Rash - allergic reaction to ceftriaxone (on allergy list).  Could be fixed drug eruption?  - will continue to monitor   Thrombocytopenia  Leukopenia - will monitor  Hypokalemia Replace and follow  Bipolar disorder-not currently on any medication     DVT prophylaxis: Lovenox Code Status: full Family Communication: none Disposition:   Status is: Inpatient Remains inpatient appropriate because: need for ongoing inpatient care   Consultants:  ID  Procedures:  none  Antimicrobials:  Anti-infectives (From admission, onward)    Start     Dose/Rate Route Frequency Ordered Stop   12/24/23 1100  ertapenem (INVANZ) 1 g in sodium chloride 0.9 % 100 mL IVPB        1 g 200 mL/hr over 30  Minutes Intravenous Daily 12/24/23 1014 12/27/23 0959   12/24/23 1030  ceFAZolin (ANCEF) IVPB 1 g/50 mL premix  Status:  Discontinued        1 g 100 mL/hr over 30 Minutes Intravenous Every 8 hours 12/24/23 0941 12/24/23 1014   12/24/23 0900  cefTRIAXone (ROCEPHIN) 1 g in sodium chloride 0.9 % 100 mL IVPB  Status:  Discontinued        1 g 200 mL/hr over 30 Minutes Intravenous Every 24 hours 12/23/23 1026 12/23/23 1047   12/23/23 0945  cefTRIAXone (ROCEPHIN) 1 g in sodium chloride 0.9 % 100 mL IVPB        1 g 200 mL/hr over 30 Minutes Intravenous  Once 12/23/23 0938 12/23/23 1015       Subjective: Persistent dizziness Feels Jordan [name] little better  Objective: Vitals:   12/24/23 1334 12/24/23 2031 12/25/23 0457 12/25/23 1148  BP: (!) 149/70 (!) 147/67 (!) 146/79 (!) 144/64  Pulse: 80 72 78 73  Resp: 18 18 16    Temp: 98.7 F (37.1 C) 98.5 F (36.9 C) 98.4 F (36.9 C) 98.5 F (36.9 C)  TempSrc:   Oral Oral  SpO2: 96% 93% 95% 96%  Weight:      Height:        Intake/Output Summary (Last 24 hours) at 12/25/2023 1532 Last data filed at 12/25/2023 0700 Gross per 24 hour  Intake 340 ml  Output 600 ml  Net -260 ml   Filed Weights   12/23/23 1702  Weight: (!) 148.2 kg    Examination:  General: No acute distress. Cardiovascular:  RRR Lungs: unlabored Abdomen: Soft, nontender, nondistended  Neurological: Alert and oriented 3. Moves all extremities 4 . Cranial nerves II through XII grossly intact. Skin: rash seems to be improving Extremities: No clubbing or cyanosis. No edema.    Data Reviewed: I have personally reviewed following labs and imaging studies  CBC: Recent Labs  Lab 12/22/23 0835 12/23/23 0826 12/24/23 0607 12/25/23 0559  WBC 5.3 5.2 6.0 2.9*  NEUTROABS 4.0 4.1  --  1.6*  HGB 14.8 14.7 13.2 14.7  HCT 43.2 44.8 39.7 45.0  MCV 88.7 91.6 90.2 92.0  PLT 167 180 177 105*    Basic Metabolic Panel: Recent Labs  Lab 12/22/23 0835 12/23/23 0826 12/24/23 0607  12/25/23 0559  NA 138 136 134* 140  K 4.0 3.6 3.2* 4.2  CL 104 101 102 106  CO2 24 24 23 24   GLUCOSE 93 82 103* 98  BUN 9 12 13 18   CREATININE 0.71 0.76 0.58 0.65  CALCIUM 9.2 8.8* 8.6* 8.5*  MG  --   --   --  2.1  PHOS  --   --   --  4.4    GFR: Estimated Creatinine Clearance: 122.4 mL/min (by C-G formula based on SCr of 0.65 mg/dL).  Liver Function Tests: Recent Labs  Lab 12/22/23 0835 12/23/23 0826 12/25/23 0559  AST 26 24 24   ALT 16 18 14   ALKPHOS 123 131* 97  BILITOT 0.9 1.5* 0.6  PROT 7.0 7.4 5.9*  ALBUMIN 3.9 4.3 3.4*    CBG: No results for input(s): "GLUCAP" in the last 168 hours.   Recent Results (from the past 240 hours)  Urine Culture     Status: Abnormal   Collection Time: 12/22/23  2:48 PM   Specimen: Urine, Clean Catch  Result Value Ref Range Status   Specimen Description URINE, CLEAN CATCH  Final   Special Requests   Final    NONE Performed at [facility], [address] [place], [place] [address]    Culture MULTIPLE SPECIES PRESENT, SUGGEST RECOLLECTION (Jordan [name])  Final   Report Status 12/24/2023 FINAL  Final  Urine Culture     Status: Abnormal (Preliminary result)   Collection Time: 12/23/23  7:58 AM   Specimen: Urine, Clean Catch  Result Value Ref Range Status   Specimen Description URINE, CLEAN CATCH  Final   Special Requests NONE  Final   Culture (Jordan [name])  Final    >=100,000 COLONIES/mL MORGANELLA MORGANII CULTURE REINCUBATED FOR BETTER GROWTH SUSCEPTIBILITIES TO FOLLOW    Report Status PENDING  Incomplete   Organism ID, Bacteria MORGANELLA MORGANII (Jordan [name])  Final      Susceptibility   Morganella morganii - MIC*    AMPICILLIN >=32 RESISTANT Resistant     CIPROFLOXACIN 2 RESISTANT Resistant     GENTAMICIN <=1 SENSITIVE Sensitive     IMIPENEM 2 SENSITIVE Sensitive     NITROFURANTOIN 128 RESISTANT Resistant     TRIMETH/SULFA >=320 RESISTANT Resistant     AMPICILLIN/SULBACTAM <=2 SENSITIVE Sensitive     PIP/TAZO Value in next row Sensitive  ug/mL     <=4 SENSITIVEPerformed at [facility], [address] [place], [place] [address]    * >=100,000 COLONIES/mL Jordan [name] MORGANII         Radiology Studies: No results found.      Scheduled Meds:  enoxaparin (LOVENOX) injection  0.5 mg/kg Subcutaneous Q24H   nystatin   Topical BID   venlafaxine XR  75 mg Oral Q breakfast  Continuous Infusions:  ertapenem 1 g (12/25/23 0902)     LOS: 2 days    Time spent: over 30 min    Lacretia Nicks, MD [facility]   To contact the attending provider between 7A-7P or the covering provider during after hours 7P-7A, please log into the web site [email or web address] and access using universal [place] password for that web site. If you do not have the password, please call the hospital operator.  12/25/2023, 3:32 PM

## 2023-12-25 NOTE — Plan of Care (Signed)
  Problem: Education: Goal: Knowledge of General Education information will improve Description: Including pain rating scale, medication(s)/side effects and non-pharmacologic comfort measures Outcome: Progressing   Problem: Clinical Measurements: Goal: Diagnostic test results will improve Outcome: Progressing   Problem: Elimination: Goal: Will not experience complications related to urinary retention Outcome: Progressing   

## 2023-12-25 NOTE — [place] FL2 (Signed)
 [place] MEDICAID FL2 LEVEL OF CARE FORM     IDENTIFICATION  Patient Name: Jordan [name] Birthdate: 1965/11/22 Sex: female Admission Date (Current Location): 12/23/2023  [facility] and [place] Number:  Producer, television/film/video and Address:  [facility],  [address] [place], [place] [address]      Provider Number: [id number]  Attending Physician Name and Address:  Zigmund Daniel., *  Relative Name and Phone Number:  harding,jone Clearence Cheek)  [phone]    Current Level of Care: Hospital Recommended Level of Care: Skilled Nursing Facility Prior Approval Number:    Date Approved/Denied:   PASRR Number: Pending  Discharge Plan: SNF    Current Diagnoses: Patient Active Problem List   Diagnosis Date Noted   UTI (urinary tract infection) 12/23/2023   Closed fracture of right tibia and fibula 09/13/2023   Closed displaced fracture of first metatarsal bone of right foot 09/13/2023   Thoracic radiculitis 02/10/2023   Schizophrenia (HCC) 02/10/2023   Pain in thoracic spine 02/10/2023   Moderate intellectual disability 02/10/2023   Lumbar radiculopathy 02/10/2023   Chronic pain 02/10/2023   Cystitis 01/16/2023   Dysuria 12/12/2022   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   Vitamin B12 deficiency 08/19/2022   Complicated UTI (urinary tract infection) 08/17/2022   Grief 04/17/2022   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   Menopausal symptoms 05/02/2021   Urge incontinence of urine 12/26/2020   Leukopenia    Vitamin D deficiency    Acute lower UTI    Morbid obesity with BMI of 40.0-44.9, adult (HCC) 06/11/2019   Vaginal cancer (HCC) 05/28/2019   Visit for routine gyn exam 04/21/2019   Vaginal atrophy 04/21/2019   Urinary tract infection with hematuria 07/01/2015   Diastolic dysfunction 07/01/2015   Constipation 05/12/2015   ESBL (extended spectrum beta-lactamase) producing bacteria infection 03/17/2015   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   Anxiety state 02/20/2015   Claustrophobia  02/20/2015   Generalized anxiety disorder 02/20/2015   Transaminitis 02/18/2015   Chest pain 02/18/2015   Pancytopenia (HCC)    Abdominal pain    Dizziness    Hypotension 01/04/2015   Bipolar disorder (HCC) 06/29/2012    Orientation RESPIRATION BLADDER Height & Weight     Self, Time, Situation, Place  Normal Continent Weight: (!) 326 lb 11.6 oz (148.2 kg) Height:  5\' 8"  (172.7 cm)  BEHAVIORAL SYMPTOMS/MOOD NEUROLOGICAL BOWEL NUTRITION STATUS      Continent Diet (Regular)  AMBULATORY STATUS COMMUNICATION OF NEEDS Skin   Limited Assist Verbally Normal                       Personal Care Assistance Level of Assistance  Bathing, Feeding, Dressing Bathing Assistance: Maximum assistance Feeding assistance: Independent Dressing Assistance: Maximum assistance     Functional Limitations Info  Sight, Hearing, Speech Sight Info: Impaired Hearing Info: Adequate Speech Info: Adequate    SPECIAL CARE FACTORS FREQUENCY  PT (By licensed PT), OT (By licensed OT)     PT Frequency: 5x/wk OT Frequency: 5x/wk            Contractures Contractures Info: Not present    Additional Factors Info  Code Status, Allergies, Psychotropic Code Status Info: FULL Allergies Info: Ceftriaxone, Ciprofloxacin, Citalopram, Lamotrigine, Sulfa Antibiotics, Tramadol Psychotropic Info: Effexor         Current Medications (12/25/2023):  This is the current hospital active medication list Current Facility-Administered Medications  Medication Dose Route Frequency Provider Last Rate Last Admin   acetaminophen (TYLENOL) tablet 650 mg  650 mg Oral Q6H PRN Maryln Gottron, MD   650 mg at 12/23/23 1925   Or   acetaminophen (TYLENOL) suppository 650 mg  650 mg Rectal Q6H PRN [name] Crigler, Mir M, MD       albuterol (PROVENTIL) (2.5 MG/3ML) 0.083% nebulizer solution 2.5 mg  2.5 mg Nebulization Q2H PRN [name] Crigler, Mir M, MD       enoxaparin (LOVENOX) injection 75 mg  0.5 mg/kg Subcutaneous Q24H  [name] Crigler, Mir M, MD   75 mg at 12/24/23 2133   ertapenem [facility]) 1 g in sodium chloride 0.9 % 100 mL IVPB  1 g Intravenous Daily Odette Fraction, MD 200 mL/hr at 12/25/23 0902 1 g at 12/25/23 0902   gabapentin (NEURONTIN) capsule 800 mg  800 mg Oral TID PRN Maryln Gottron, MD       loperamide (IMODIUM) capsule 2 mg  2 mg Oral Q8H PRN [name] Crigler, Mir M, MD       nystatin (MYCOSTATIN/NYSTOP) topical powder   Topical BID Maryln Gottron, MD   Given at 12/25/23 0902   ondansetron [facility]) tablet 4 mg  4 mg Oral Q6H PRN [name] Crigler, Mir M, MD       Or   ondansetron [facility]) injection 4 mg  4 mg Intravenous Q6H PRN [name] Crigler, Mir M, MD   4 mg at 12/24/23 0901   oxyCODONE (Oxy IR/ROXICODONE) immediate release tablet 15 mg  15 mg Oral Q4H PRN [name] Crigler, Mir M, MD       traZODone (DESYREL) tablet 25 mg  25 mg Oral QHS PRN [name] Crigler, Mir M, MD   25 mg at 12/24/23 2137   venlafaxine XR (EFFEXOR-XR) 24 hr capsule 75 mg  75 mg Oral Q breakfast [name] Crigler, Mir M, MD   75 mg at 12/25/23 0900     Discharge Medications: Please see discharge summary for a list of discharge medications.  Relevant Imaging Results:  Relevant Lab Results:   Additional Information SS#:[id number]  Otelia Santee, LCSW

## 2023-12-26 DIAGNOSIS — R531 Weakness: Secondary | ICD-10-CM | POA: Diagnosis not present

## 2023-12-26 LAB — COMPREHENSIVE METABOLIC PANEL
ALT: 13 U/L (ref 0–44)
AST: 15 U/L (ref 15–41)
Albumin: 3.1 g/dL — ABNORMAL LOW (ref 3.5–5.0)
Alkaline Phosphatase: 94 U/L (ref 38–126)
Anion gap: 10 (ref 5–15)
BUN: 17 mg/dL (ref 6–20)
CO2: 25 mmol/L (ref 22–32)
Calcium: 8.4 mg/dL — ABNORMAL LOW (ref 8.9–10.3)
Chloride: 105 mmol/L (ref 98–111)
Creatinine, Ser: 0.5 mg/dL (ref 0.44–1.00)
GFR, Estimated: >60 mL/min (ref >60)
Glucose, Bld: 104 mg/dL — ABNORMAL HIGH (ref 70–99)
Potassium: 4 mmol/L (ref 3.5–5.1)
Sodium: 140 mmol/L (ref 135–145)
Total Bilirubin: 0.6 mg/dL (ref 0.0–1.2)
Total Protein: 5.7 g/dL — ABNORMAL LOW (ref 6.5–8.1)

## 2023-12-26 LAB — MAGNESIUM: Magnesium: 2 mg/dL (ref 1.7–2.4)

## 2023-12-26 LAB — CBC WITH DIFFERENTIAL/PLATELET
Abs Immature Granulocytes: 0.01 10*3/uL (ref 0.00–0.07)
Basophils Absolute: 0 10*3/uL (ref 0.0–0.1)
Basophils Relative: 0 %
Eosinophils Absolute: 0 10*3/uL (ref 0.0–0.5)
Eosinophils Relative: 0 %
HCT: 42.5 % (ref 36.0–46.0)
Hemoglobin: 13.4 g/dL (ref 12.0–15.0)
Immature Granulocytes: 0 %
Lymphocytes Relative: 27 %
Lymphs Abs: 1 10*3/uL (ref 0.7–4.0)
MCH: 30.3 pg (ref 26.0–34.0)
MCHC: 31.5 g/dL (ref 30.0–36.0)
MCV: 96.2 fL (ref 80.0–100.0)
Monocytes Absolute: 0.5 10*3/uL (ref 0.1–1.0)
Monocytes Relative: 12 %
Neutro Abs: 2.3 10*3/uL (ref 1.7–7.7)
Neutrophils Relative %: 61 %
Platelets: 161 10*3/uL (ref 150–400)
RBC: 4.42 MIL/uL (ref 3.87–5.11)
RDW: 13.2 % (ref 11.5–15.5)
WBC: 3.7 10*3/uL — ABNORMAL LOW (ref 4.0–10.5)
nRBC: 0 % (ref 0.0–0.2)

## 2023-12-26 LAB — URINE CULTURE: Culture: >=100000 — AB

## 2023-12-26 LAB — PHOSPHORUS: Phosphorus: 4 mg/dL (ref 2.5–4.6)

## 2023-12-26 NOTE — Plan of Care (Signed)
  Problem: Education: Goal: Knowledge of General Education information will improve Description: Including pain rating scale, medication(s)/side effects and non-pharmacologic comfort measures Outcome: Progressing   Problem: Clinical Measurements: Goal: Ability to maintain clinical measurements within normal limits will improve Outcome: Progressing Goal: Diagnostic test results will improve Outcome: Progressing   Problem: Activity: Goal: Risk for activity intolerance will decrease Outcome: Progressing   Problem: Nutrition: Goal: Adequate nutrition will be maintained Outcome: Progressing   Problem: Elimination: Goal: Will not experience complications related to urinary retention Outcome: Progressing

## 2023-12-26 NOTE — Progress Notes (Signed)
 PROGRESS NOTE    Jordan [name]  ZOX:[id number] DOB: Oct 25, 1965 DOA: 12/23/2023 PCP: Jordan Paris, NP  Chief Complaint  Patient presents with   Abdominal Pain   Nausea    Brief Narrative:   Jordan [name] is Jordan [name] 58 y.o. female with medical history significant for bipolar disorder, recent hospitalization and outpatient rehab for right ankle fracture status post surgical repair being admitted to the hospital with weakness and UTI.   Assessment & Plan:   Active Problems:   Weakness generalized   UTI (urinary tract infection)  Morganella Morganii UTI-without fever, leukocytosis, but has persistent lower pelvic pain, dysuria.  Received [name] dose of fosfomycin in the ER 3/3.  Urinalysis remains abnormal. -Inpatient admission -Follow-up urine culture with morganella morganii and klebsiella - ID recommended 3 days invanz -appreciate ID recs   Dizziness - not clear if this is vertigo or lightheadedness (she mentions [name] little of both?) - negative orthostatics - MRI brain without acute abnormality - continue therapy, OOB - she's spending most of time in bed, could be worsening orthostasis (though negative orthostatics), needs to get OOB and participate with therapy (therapy mentioned she's avoiding drinking sometimes to avoid getting up to the bathroom - which would also worsen orthostasis)  Generalized Weakness - presumed related to UTI above - orthostatics (pending) - PT/OT -> recommending SNF - monitor response on abx  Diffuse Rash - allergic reaction to ceftriaxone (on allergy list).  Could be fixed drug eruption?  - will continue to monitor   Thrombocytopenia  Leukopenia - will monitor  Hypokalemia Replace and follow  Bipolar disorder-not currently on any medication     DVT prophylaxis: Lovenox Code Status: full Family Communication: none Disposition:   Status is: Inpatient Remains inpatient appropriate because: need for ongoing inpatient care   Consultants:   ID  Procedures:  none  Antimicrobials:  Anti-infectives (From admission, onward)    Start     Dose/Rate Route Frequency Ordered Stop   12/24/23 1100  ertapenem (INVANZ) 1 g in sodium chloride 0.9 % 100 mL IVPB        1 g 200 mL/hr over 30 Minutes Intravenous Daily 12/24/23 1014 12/26/23 0937   12/24/23 1030  ceFAZolin (ANCEF) IVPB 1 g/50 mL premix  Status:  Discontinued        1 g 100 mL/hr over 30 Minutes Intravenous Every 8 hours 12/24/23 0941 12/24/23 1014   12/24/23 0900  cefTRIAXone (ROCEPHIN) 1 g in sodium chloride 0.9 % 100 mL IVPB  Status:  Discontinued        1 g 200 mL/hr over 30 Minutes Intravenous Every 24 hours 12/23/23 1026 12/23/23 1047   12/23/23 0945  cefTRIAXone (ROCEPHIN) 1 g in sodium chloride 0.9 % 100 mL IVPB        1 g 200 mL/hr over 30 Minutes Intravenous  Once 12/23/23 0938 12/23/23 1015       Subjective: Still c/o lh   Objective: Vitals:   12/25/23 1148 12/25/23 2016 12/26/23 0440 12/26/23 1336  BP: (!) 144/64 (!) 141/69 (!) 121/56 139/68  Pulse: 73 72 68 81  Resp:  20 19 17   Temp: 98.5 F (36.9 C) 98.7 F (37.1 C) 98 F (36.7 C) 98.2 F (36.8 C)  TempSrc: Oral     SpO2: 96% 97% 95% 90%  Weight:      Height:        Intake/Output Summary (Last 24 hours) at 12/26/2023 1614 Last data filed at 12/26/2023 1403 Gross per 24 hour  Intake 580 ml  Output 1800 ml  Net -1220 ml   Filed Weights   12/23/23 1702  Weight: (!) 148.2 kg    Examination:  General: No acute distress. Cardiovascular: RRR Lungs: unlabored Abdomen: Soft, nontender, nondistended  Neurological: Alert and oriented 3. Moves all extremities 4 with equal strength. Cranial nerves II through XII grossly intact. Extremities: No clubbing or cyanosis. No edema.   Data Reviewed: I have personally reviewed following labs and imaging studies  CBC: Recent Labs  Lab 12/22/23 0835 12/23/23 0826 12/24/23 0607 12/25/23 0559 12/26/23 0524  WBC 5.3 5.2 6.0 2.9* 3.7*   NEUTROABS 4.0 4.1  --  1.6* 2.3  HGB 14.8 14.7 13.2 14.7 13.4  HCT 43.2 44.8 39.7 45.0 42.5  MCV 88.7 91.6 90.2 92.0 96.2  PLT 167 180 177 105* 161    Basic Metabolic Panel: Recent Labs  Lab 12/22/23 0835 12/23/23 0826 12/24/23 0607 12/25/23 0559 12/26/23 0524  NA 138 136 134* 140 140  K 4.0 3.6 3.2* 4.2 4.0  CL 104 101 102 106 105  CO2 24 24 23 24 25   GLUCOSE 93 82 103* 98 104*  BUN 9 12 13 18 17   CREATININE 0.71 0.76 0.58 0.65 0.50  CALCIUM 9.2 8.8* 8.6* 8.5* 8.4*  MG  --   --   --  2.1 2.0  PHOS  --   --   --  4.4 4.0    GFR: Estimated Creatinine Clearance: 122.4 mL/min (by C-G formula based on SCr of 0.5 mg/dL).  Liver Function Tests: Recent Labs  Lab 12/22/23 0835 12/23/23 0826 12/25/23 0559 12/26/23 0524  AST 26 24 24 15   ALT 16 18 14 13   ALKPHOS 123 131* 97 94  BILITOT 0.9 1.5* 0.6 0.6  PROT 7.0 7.4 5.9* 5.7*  ALBUMIN 3.9 4.3 3.4* 3.1*    CBG: No results for input(s): "GLUCAP" in the last 168 hours.   Recent Results (from the past 240 hours)  Urine Culture     Status: Abnormal   Collection Time: 12/22/23  2:48 PM   Specimen: Urine, Clean Catch  Result Value Ref Range Status   Specimen Description URINE, CLEAN CATCH  Final   Special Requests   Final    NONE Performed at [facility], [name], [place], [place] [address]    Culture MULTIPLE SPECIES PRESENT, SUGGEST RECOLLECTION ([name])  Final   Report Status 12/24/2023 FINAL  Final  Urine Culture     Status: Abnormal   Collection Time: 12/23/23  7:58 AM   Specimen: Urine, Clean Catch  Result Value Ref Range Status   Specimen Description   Final    URINE, CLEAN CATCH Performed at [facility], [address] [place], [place] [address]    Special Requests   Final    NONE Performed at [facility], [address], [name], [place] [address]    Culture ([name])  Final    >=100,000 COLONIES/mL MORGANELLA MORGANII 40,000 COLONIES/mL KLEBSIELLA  PNEUMONIAE    Report Status 12/26/2023 FINAL  Final   Organism ID, Bacteria MORGANELLA MORGANII ([name])  Final   Organism ID, Bacteria KLEBSIELLA PNEUMONIAE ([name])  Final      Susceptibility   Klebsiella pneumoniae - MIC*    AMPICILLIN RESISTANT Resistant     CEFAZOLIN <=4 SENSITIVE Sensitive     CEFEPIME <=0.12 SENSITIVE Sensitive     CEFTRIAXONE <=0.25 SENSITIVE Sensitive     CIPROFLOXACIN <=0.25 SENSITIVE Sensitive  GENTAMICIN 8 INTERMEDIATE Intermediate     IMIPENEM <=0.25 SENSITIVE Sensitive     NITROFURANTOIN 32 SENSITIVE Sensitive     TRIMETH/SULFA <=20 SENSITIVE Sensitive     AMPICILLIN/SULBACTAM 4 SENSITIVE Sensitive     PIP/TAZO <=4 SENSITIVE Sensitive ug/mL    * 40,000 COLONIES/mL KLEBSIELLA PNEUMONIAE   Morganella morganii - MIC*    AMPICILLIN >=32 RESISTANT Resistant     CIPROFLOXACIN 2 RESISTANT Resistant     GENTAMICIN <=1 SENSITIVE Sensitive     IMIPENEM 2 SENSITIVE Sensitive     NITROFURANTOIN 128 RESISTANT Resistant     TRIMETH/SULFA >=320 RESISTANT Resistant     AMPICILLIN/SULBACTAM <=2 SENSITIVE Sensitive     PIP/TAZO <=4 SENSITIVE Sensitive ug/mL    * >=100,000 COLONIES/mL [facility] MORGANII         Radiology Studies: MR BRAIN WO CONTRAST Result Date: 12/26/2023 CLINICAL DATA:  Vertigo. EXAM: MRI HEAD WITHOUT CONTRAST TECHNIQUE: Multiplanar, multiecho pulse sequences of the brain and surrounding structures were obtained without intravenous contrast. COMPARISON:  CT head August 17, 2022. FINDINGS: Brain: No acute infarction, hemorrhage, hydrocephalus, extra-axial collection or mass lesion. Mild, small T2/FLAIR hyperintensities in the white matter, which are nonspecific but considered within normal limits for patient age. Vascular: Major arterial flow voids are maintained at the skull base. Skull and upper cervical spine: Normal marrow signal. Sinuses/Orbits: Clear sinuses.  No acute orbital findings. Other: No mastoid effusions. IMPRESSION: No acute abnormality.  Electronically Signed   By: Feliberto Harts M.D.   On: 12/26/2023 02:54        Scheduled Meds:  enoxaparin (LOVENOX) injection  0.5 mg/kg Subcutaneous Q24H   nystatin   Topical BID   venlafaxine XR  75 mg Oral Q breakfast   Continuous Infusions:     LOS: 3 days    Time spent: over 30 min    Lacretia Nicks, MD [facility]   To contact the attending provider between 7A-7P or the covering provider during after hours 7P-7A, please log into the web site [email or web address] and access using universal [place] password for that web site. If you do not have the password, please call the hospital operator.  12/26/2023, 4:14 PM

## 2023-12-26 NOTE — Plan of Care (Signed)

## 2023-12-26 NOTE — Progress Notes (Signed)
 Physical Therapy Treatment Patient Details Name: Jordan [name] MRN: [id number] DOB: 01/10/1966 Today's Date: 12/26/2023   History of Present Illness Pt is 58 yo female admitted on 12/23/23 with weakness and UTI.  Pt also found to have hypokalemia.  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 Pt continues to report ongoing lightheaded and dizziness - not consistent with BPPV and negative orthostatic. Had her perform gaze stabilization exercises - tolerated well slower speed. Pt expressing lots of anxiety/depression in regards to limited help/interaction at home and worried about long term options - notified MD and TOC (see comments).  Patient will benefit from continued inpatient follow up therapy, <3 hours/day at d/c.     Orthostatic Vitals: NEGATIVE Supine 139/68 Sitting 148/85 Standing 134/86 Walking -pt stated "oh boy" and had to sit, expressed both dizzy and lightheaded.  BP was 155/102, HR 90's, sats 93% Sitting after 5 mins 137/106: pt talking and still anxious Several mins later, had pt take deep breaths, relax, not talking, reclined 108/87   If plan is discharge home, recommend the following: A lot of help with walking and/or transfers;A lot of help with bathing/dressing/bathroom;Assistance with cooking/housework;Direct supervision/assist for financial management;Help with stairs or ramp for entrance   Can travel by private vehicle     Yes  Equipment Recommendations  Wheelchair cushion (measurements PT);Wheelchair (measurements PT)    Recommendations for Other Services       Precautions / Restrictions Precautions Precautions: Fall     Mobility  Bed Mobility Overal bed mobility: Needs Assistance Bed Mobility: Supine to Sit     Supine to sit: Supervision, HOB elevated, Used  rails     General bed mobility comments: increased time; heavy use of rail    Transfers Overall transfer level: Needs assistance Equipment used: Rolling walker (2 wheels) Transfers: Sit to/from Stand Sit to Stand: Min assist           General transfer comment: STS x 4 during session - from elevated bed x 2 and recliner x 2.  Cues for hand placement and scooting forward    Ambulation/Gait Ambulation/Gait assistance: Min assist Gait Distance (Feet): 8 Feet (5', 2', 8') Assistive device: Rolling walker (2 wheels) Gait Pattern/deviations: Step-to pattern, Decreased stride length, Decreased weight shift to right Gait velocity: decreased     General Gait Details: Unsteady requiring min A and chair follow; fatigues easily; reports dizzy/lightheaded   Stairs             Wheelchair Mobility     Tilt Bed    Modified Rankin (Stroke Patients Only)       Balance Overall balance assessment: Needs assistance Sitting-balance support: No upper extremity supported Sitting balance-Leahy Scale: Fair     Standing balance support: Bilateral upper extremity supported, Reliant on assistive device for balance Standing balance-Leahy Scale: Poor                              Communication    Cognition Arousal: Alert Behavior During Therapy: Anxious   PT - Cognitive impairments: No apparent impairments                       PT - Cognition Comments: Tangential at times; expressed anxiety, worry,  etc        Cueing    Exercises      General Comments   Increased time spent active listening and responding within scope of practice when able. Pt noted on board "what's important to you today?"  She started conversation in regards to worry about getting better and managing at home.  Expressed has no assist available and not able to manage on her own.  States going to SNF but coverage may end after 1 week, "then what?"  Expressed did well at SNF b/c people  helping to manage her needs as well as for interaction. Reports issues with insurance an long term options (aides, etc) in past.  Expressed overwhelmed with medical and insurance information that she doesn't always understand and reports feels like hears different things from different team members.  Tried to encourage pt that she made progress at rehab last time and will continue to work with therapy.  Also, verbalized understanding of her concern and notified MD and TOC.     Pt asking about her dizziness.  Again discussed negative orthostatic and negative BPPV.  Felt could be multifactoral - anxiety, vestibular hypofunction, decreased activity, UTI.  Performed gaze stabilization exercise in supine 30 sec x 1 slow speed.  Also during session, asked pt if she wanted water and stated "no, then I'll just have to go to bathroom" - discussed some dizziness/weakness at home possibly due to dehydration/nutrition if not eating/drinking well.      Pertinent Vitals/Pain Pain Assessment Pain Assessment: No/denies pain    Home Living                          Prior Function            PT Goals (current goals can now be found in the care plan section) Progress towards PT goals: Progressing toward goals    Frequency    Min 2X/week      PT Plan      Co-evaluation              AM-PAC PT "6 Clicks" Mobility   Outcome Measure  Help needed turning from your back to your side while in a flat bed without using bedrails?: A Little Help needed moving from lying on your back to sitting on the side of a flat bed without using bedrails?: A Lot ((heavy use of rails)) Help needed moving to and from a bed to a chair (including a wheelchair)?: A Little Help needed standing up from a chair using your arms (e.g., wheelchair or bedside chair)?: A Little Help needed to walk in hospital room?: Total (<20') Help needed climbing 3-5 steps with a railing? : Total 6 Click Score: 13    End of  Session Equipment Utilized During Treatment: Gait belt Activity Tolerance: Patient limited by fatigue Patient left: with call bell/phone within reach;with chair alarm set;in chair Nurse Communication: Mobility status PT Visit Diagnosis: Other abnormalities of gait and mobility (R26.89);Muscle weakness (generalized) (M62.81)     Time: 9562-1308 PT Time Calculation (min) (ACUTE ONLY): 51 min  Charges:    $Gait Training: 8-22 mins $Therapeutic Activity: 23-37 mins PT General Charges $$ ACUTE PT VISIT: 1 Visit                     Anise Salvo, PT Acute Rehab [facility] [phone]    Rayetta Humphrey 12/26/2023, 4:12 PM

## 2023-12-26 NOTE — TOC Progression Note (Addendum)
 Transition of Care [facility]) - Progression Note    Patient Details  Name: Jordan [name] MRN: [id number] Date of Birth: 1966/02/28  [facility] CM/SW Contact  Otelia Santee, LCSW Phone Number: 12/26/2023, 3:11 PM  Clinical Narrative:    Titus Dubin with [facility] to meet with pt for PASRR review.  Pt accepted bed offer for [facility]. [facility] only able to accept pt with LOG. Hospital able to provide a 1 week LOG.  Pt expected to be medically stable to discharge to [facility] on Saturday 12/27/23. Pt will be going to room 216B. RN to call report to [phone]. Referral made to [employer] for assistance with LTC.   Expected Discharge Plan: Skilled Nursing Facility Barriers to Discharge: Awaiting State Approval Cherlyn Roberts), SNF Pending bed offer  Expected Discharge Plan and Services In-house Referral: Clinical Social Work Discharge Planning Services: NA Post Acute Care Choice: Skilled Nursing Facility Living arrangements for the past 2 months: Apartment                 DME Arranged: N/A DME Agency: NA                   Social Determinants of Health (SDOH) Interventions SDOH Screenings   Food Insecurity: No Food Insecurity (12/23/2023)  Housing: Low Risk  (12/23/2023)  Transportation Needs: No Transportation Needs (12/23/2023)  Utilities: Not At Risk (12/23/2023)  Depression (PHQ2-9): Low Risk  (07/16/2023)  Tobacco Use: Low Risk  (12/23/2023)    Readmission Risk Interventions    12/26/2023    3:11 PM 12/26/2023    2:48 PM 12/25/2023   10:59 AM  Readmission Risk Prevention Plan  Transportation Screening Complete Complete Complete  PCP or Specialist Appt within 5-7 Days Complete Complete Complete  Home Care Screening  Complete Complete  Medication Review (RN CM)  Complete Complete

## 2023-12-26 NOTE — Plan of Care (Signed)
  Problem: Education: Goal: Knowledge of General Education information will improve Description: Including pain rating scale, medication(s)/side effects and non-pharmacologic comfort measures Outcome: Progressing   Problem: Clinical Measurements: Goal: Ability to maintain clinical measurements within normal limits will improve Outcome: Progressing Goal: Diagnostic test results will improve Outcome: Progressing   Problem: Nutrition: Goal: Adequate nutrition will be maintained Outcome: Progressing   Problem: Elimination: Goal: Will not experience complications related to urinary retention Outcome: Progressing   

## 2023-12-27 DIAGNOSIS — R531 Weakness: Secondary | ICD-10-CM | POA: Diagnosis not present

## 2023-12-27 MED ORDER — POLYETHYLENE GLYCOL 3350 17 G PO PACK
17.0000 g | PACK | Freq: Two times a day (BID) | ORAL | Status: DC
Start: 2023-12-27 — End: 2023-12-27
  Administered 2023-12-27: 17 g via ORAL
  Filled 2023-12-27: qty 1

## 2023-12-27 MED ORDER — OXYCODONE HCL 15 MG PO TABS: 15.0000 mg | ORAL_TABLET | ORAL | 0 refills | Status: AC | PRN

## 2023-12-27 MED ORDER — POLYETHYLENE GLYCOL 3350 17 G PO PACK: 17.0000 g | PACK | Freq: Every day | ORAL | 0 refills | Status: DC

## 2023-12-27 NOTE — Progress Notes (Signed)
 Mobility Specialist - Progress Note   12/27/23 0924  Mobility  Activity Ambulated with assistance to bathroom  Level of Assistance Standby assist, set-up cues, supervision of patient - no hands on  Assistive Device Front wheel walker  Distance Ambulated (ft) 20 ft  Activity Response Tolerated well  Mobility Referral Yes  Mobility visit 1 Mobility  Mobility Specialist Start Time (ACUTE ONLY) [id number]  Mobility Specialist Stop Time (ACUTE ONLY) [id number]  Mobility Specialist Time Calculation (min) (ACUTE ONLY) 25 min   Pt received in bed and agreeable to mobility. Prior to ambulating, pt requested assistance to the bathroom. Upon returning from bathroom, pt c/o feeling dizzy. Opted out to sitting EOB for ~78min. Pt to bed after session with all needs met.    [facility]

## 2023-12-27 NOTE — Discharge Summary (Signed)
 Physician Discharge Summary  Jordan [name] HYQ:[id number] DOB: 01-04-1966 DOA: 12/23/2023  PCP: Courtney Paris, NP  Admit date: 12/23/2023 Discharge date: 12/27/2023  Time spent: 40 minutes  Recommendations for Outpatient Follow-up:  Follow outpatient CBC/CMP  Consider checking TSH/cortisol outpatient if orthostasis is persistent Encourage working with therapy, out of bed, time in chair, elevate head of bed - time spent flat in bed is potentially worsening her orthostasis  Please facilitate follow up with psychiatry/counselor   Discharge Diagnoses:  Active Problems:   Weakness generalized   UTI (urinary tract infection)   Discharge Condition: stable  Diet recommendation: heart healthy  Filed Weights   12/23/23 1702  Weight: (!) 148.2 kg    History of present illness:   Jordan [name] is Jordan [name] 58 y.o. female with medical history significant for bipolar disorder, recent hospitalization and outpatient rehab for right ankle fracture status post surgical repair being admitted to the hospital with weakness and UTI.   She's been treated for Jordan [name] UTI.   Stable for discharge, still c/o dizziness, but workup so far unrevealing.  Needs to continue to work with therapy, consider additional workup outpatient.  Hospital Course:  Assessment and Plan:  Morganella Morganii UTI-without fever, leukocytosis, but has persistent lower pelvic pain, dysuria.  Received Melonee Gerstel dose of fosfomycin in the ER 3/3.  Urinalysis remains abnormal. -Inpatient admission -Follow-up urine culture with morganella morganii and klebsiella - ID recommended 3 days invanz -appreciate ID recs   Dizziness - not clear if this is vertigo or lightheadedness (she mentions Jordan [name] little of both?) - she describes symptom as constant, even when she's lying in bed.  Jordan [name] bit hard to sort out significance.   - negative orthostatics (Positive yesterday for HR) - MRI brain without acute abnormality - continue therapy, OOB, elevated head of bed over 45  degrees - she's spending most of time in bed, could be contributing to worsening orthostasis (BP increases with standing, HR meets criteria for orthostasis), needs to get OOB and participate with therapy (therapy mentioned she's avoiding drinking sometimes to avoid getting up to the bathroom - which would also worsen orthostasis).   - consider following TSH and cortisol outpatient    Generalized Weakness - presumed related to UTI above - completed antibiotics - orthostatics (positive by heart rate) - PT/OT -> recommending SNF - encourage out of bed, participation with therapy, more upright    Diffuse Rash - allergic reaction to ceftriaxone (on allergy list).  Could be fixed drug eruption?  - will continue to monitor - seems to be improving   Thrombocytopenia  Leukopenia - will monitor -> improving at discharge   Hypokalemia Replace and follow   Bipolar disorder-pristiq and depakote Notes depression, denies SI - needs f/u with psychiatry/counselor outpatient, at SNF if possible    Procedures: none   Consultations: none  Discharge Exam: Vitals:   12/26/23 2013 12/27/23 0429  BP: 133/79 135/79  Pulse: 77 75  Resp: 18 18  Temp: 98 F (36.7 C) 97.9 F (36.6 C)  SpO2: 93% 98%   No complaints Feels ok Still c/o dizziness Discussed plan for d/c to SNF  General: No acute distress. Cardiovascular: RRR Lungs: unlabored Abdomen: Soft, nontender, nondistended  Neurological: Alert and oriented 3. Moves all extremities 4 . Cranial nerves II through XII grossly intact. Extremities: No clubbing or cyanosis. No edema.   Discharge Instructions   Discharge Instructions     Call MD for:  difficulty breathing, headache or visual disturbances   Complete by: As  directed    Call MD for:  extreme fatigue   Complete by: As directed    Call MD for:  hives   Complete by: As directed    Call MD for:  persistant dizziness or light-headedness   Complete by: As directed    Call MD  for:  persistant nausea and vomiting   Complete by: As directed    Call MD for:  redness, tenderness, or signs of infection (pain, swelling, redness, odor or green/yellow discharge around incision site)   Complete by: As directed    Call MD for:  severe uncontrolled pain   Complete by: As directed    Call MD for:  temperature >100.4   Complete by: As directed    Diet - low sodium heart healthy   Complete by: As directed    Discharge instructions   Complete by: As directed    You were seen for Jordan [name] urinary tract infection.    You've been treated with antibiotics.    You have dizziness and lightheadedness.  We got an MRI of your brain and it was normal.  I think part of your lightheadedness and dizziness comes from being flat for an extended period of time.  It will be extremely important for you to work with therapy outpatient and get out of bed and try to spend as much time upright as possible.  If you have continued issues with dizziness outpatient your can ask your PCP to consider checking your thyroid and cortisol as well as seeing what additional workup they may recommend.  I think the more you participate with therapy, they more independent you can be which will allow you to be more independent when you get home.    You should ask about Makinzie Considine psychiatrist or counselor for your mood.   Return for new, recurrent, or worsening symptoms.  Please ask your PCP to request records from this hospitalization so they know what was done and what the next steps will be.   Increase activity slowly   Complete by: As directed       Allergies as of 12/27/2023       Reactions   Ceftriaxone Anaphylaxis   See progess note. Administration results in hives, throat pain, flushing and SOB.   Ciprofloxacin Swelling   Lips swell, tongue swells, face swells   Citalopram Other (See Comments)   Possible cause of pancytopenia. Swelling of tongue, face and throat   Lamotrigine Other (See Comments)   Possible  cause of pancytopenia. Swelling of face, throat and tongue   Sulfa Antibiotics Anaphylaxis   Tramadol Anaphylaxis, Shortness Of Breath, Swelling        Medication List     STOP taking these medications    divalproex 250 MG 24 hr tablet Commonly known as: DEPAKOTE ER   fosfomycin 3 g Pack Commonly known as: MONUROL       TAKE these medications    acetaminophen 325 MG tablet Commonly known as: TYLENOL Take 2 tablets (650 mg total) by mouth every 6 (six) hours as needed for mild pain (pain score 1-3), fever or headache.   cyanocobalamin 1000 MCG/ML injection Commonly known as: VITAMIN B12 Inject 1,000 mcg into the muscle every 30 (thirty) days.   desvenlafaxine 50 MG 24 hr tablet Commonly known as: PRISTIQ Take 50 mg by mouth every morning.   gabapentin 800 MG tablet Commonly known as: NEURONTIN Take 800 mg by mouth 3 (three) times daily as needed (for pain).   loperamide  2 MG capsule Commonly known as: IMODIUM Take 1 capsule (2 mg total) by mouth every 8 (eight) hours as needed for diarrhea or loose stools.   multivitamin with minerals Tabs tablet Take 1 tablet by mouth daily.   oxyCODONE 15 MG immediate release tablet Commonly known as: ROXICODONE Take 1 tablet (15 mg total) by mouth every 4 (four) hours as needed for up to 1 day. Additional prescriptions to be filled by SNF What changed:  reasons to take this additional instructions   polyethylene glycol 17 g packet Commonly known as: MIRALAX / GLYCOLAX Take 17 g by mouth daily.       Allergies  Allergen Reactions   Ceftriaxone Anaphylaxis    See progess note. Administration results in hives, throat pain, flushing and SOB.   Ciprofloxacin Swelling    Lips swell, tongue swells, face swells   Citalopram Other (See Comments)    Possible cause of pancytopenia. Swelling of tongue, face and throat   Lamotrigine Other (See Comments)    Possible cause of pancytopenia. Swelling of face, throat and tongue    Sulfa Antibiotics Anaphylaxis   Tramadol Anaphylaxis, Shortness Of Breath and Swelling    Contact information for follow-up providers     [facility] [employer] [facility]. Call.   Why: Call for assistance with seeking long term care placement Contact information: Phone:  [phone]             Contact information for after-discharge care     Destination     [facility] .   Service: Skilled Nursing Contact information: [address] [place] [address] [phone]                      The results of significant diagnostics from this hospitalization (including imaging, microbiology, ancillary and laboratory) are listed below for reference.    Significant Diagnostic Studies: MR BRAIN WO CONTRAST Result Date: 12/26/2023 CLINICAL DATA:  Vertigo. EXAM: MRI HEAD WITHOUT CONTRAST TECHNIQUE: Multiplanar, multiecho pulse sequences of the brain and surrounding structures were obtained without intravenous contrast. COMPARISON:  CT head August 17, 2022. FINDINGS: Brain: No acute infarction, hemorrhage, hydrocephalus, extra-axial collection or mass lesion. Mild, small T2/FLAIR hyperintensities in the white matter, which are nonspecific but considered within normal limits for patient age. Vascular: Major arterial flow voids are maintained at the skull base. Skull and upper cervical spine: Normal marrow signal. Sinuses/Orbits: Clear sinuses.  No acute orbital findings. Other: No mastoid effusions. IMPRESSION: No acute abnormality. Electronically Signed   By: Feliberto Harts M.D.   On: 12/26/2023 02:54   CT ABDOMEN PELVIS W CONTRAST Result Date: 12/22/2023 CLINICAL DATA:  Abdominal pain, acute, nonlocalized. History of uterine/cervical cancer. EXAM: CT ABDOMEN AND PELVIS WITH CONTRAST TECHNIQUE: Multidetector CT imaging of the abdomen and pelvis was performed using the standard protocol following bolus administration of intravenous contrast. RADIATION DOSE  REDUCTION: This exam was performed according to the departmental dose-optimization program which includes automated exposure control, adjustment of the mA and/or kV according to patient size and/or use of iterative reconstruction technique. CONTRAST:  75mL OMNIPAQUE IOHEXOL 350 MG/ML SOLN COMPARISON:  CT abdomen/pelvis dated 12/10/2022. FINDINGS: Lower chest: Stable 7 mm nodule at the right lung base, essentially unchanged since April 2017, most compatible with Jordan [name] benign etiology. Linear atelectasis/scarring in the right middle lobe. Hepatobiliary: No suspicious focal hepatic lesion. Calcified granuloma again noted at the right hepatic lobe. Status post cholecystectomy. No biliary dilatation. Pancreas: Unremarkable. No pancreatic ductal dilatation  or surrounding inflammatory changes. Spleen: Mildly enlarged, unchanged. Adrenals/Urinary Tract: Adrenal glands are unremarkable. Stable 3.8 cm cyst arising from the superior pole of the right kidney, for which no follow-up imaging is recommended. No suspicious focal lesion. No renal calculi or hydronephrosis. Bladder is partially distended with mild circumferential bladder wall thickening. Stomach/Bowel: Stomach is within normal limits. Appendix appears normal. No evidence of bowel wall thickening, distention, or inflammatory changes. Vascular/Lymphatic: The abdominal aorta is normal in caliber with mild atherosclerotic calcification. No enlarged abdominal or pelvic lymph nodes. Reproductive: Status post hysterectomy. No adnexal masses. Other: No abdominopelvic ascites. No intraperitoneal free air. No abdominal wall hernia. Musculoskeletal: Similar chronic superior endplate compression deformity of L2. No suspicious osseous lesion. IMPRESSION: 1. Mild circumferential bladder wall thickening, concerning for cystitis. Recommend correlation with urinalysis. 2. Additional unchanged chronic findings, as described above. 3.  Aortic Atherosclerosis (ICD10-I70.0). Electronically  Signed   By: Hart Robinsons M.D.   On: 12/22/2023 13:55    Microbiology: Recent Results (from the past 240 hours)  Urine Culture     Status: Abnormal   Collection Time: 12/22/23  2:48 PM   Specimen: Urine, Clean Catch  Result Value Ref Range Status   Specimen Description URINE, CLEAN CATCH  Final   Special Requests   Final    NONE Performed at [facility], [address], [place], [place] [address]    Culture MULTIPLE SPECIES PRESENT, SUGGEST RECOLLECTION (Tavon Corriher)  Final   Report Status 12/24/2023 FINAL  Final  Urine Culture     Status: Abnormal   Collection Time: 12/23/23  7:58 AM   Specimen: Urine, Clean Catch  Result Value Ref Range Status   Specimen Description   Final    URINE, CLEAN CATCH Performed at [facility], [address], [place], [place] [address]    Special Requests   Final    NONE Performed at [facility], [address] [place], [place] [address]    Culture (Dalan Cowger)  Final    >=100,000 COLONIES/mL MORGANELLA MORGANII 40,000 COLONIES/mL KLEBSIELLA PNEUMONIAE    Report Status 12/26/2023 FINAL  Final   Organism ID, Bacteria MORGANELLA MORGANII (Randell Detter)  Final   Organism ID, Bacteria KLEBSIELLA PNEUMONIAE (Saher Davee)  Final      Susceptibility   Klebsiella pneumoniae - MIC*    AMPICILLIN RESISTANT Resistant     CEFAZOLIN <=4 SENSITIVE Sensitive     CEFEPIME <=0.12 SENSITIVE Sensitive     CEFTRIAXONE <=0.25 SENSITIVE Sensitive     CIPROFLOXACIN <=0.25 SENSITIVE Sensitive     GENTAMICIN 8 INTERMEDIATE Intermediate     IMIPENEM <=0.25 SENSITIVE Sensitive     NITROFURANTOIN 32 SENSITIVE Sensitive     TRIMETH/SULFA <=20 SENSITIVE Sensitive     AMPICILLIN/SULBACTAM 4 SENSITIVE Sensitive     PIP/TAZO <=4 SENSITIVE Sensitive ug/mL    * 40,000 COLONIES/mL KLEBSIELLA PNEUMONIAE   Morganella morganii - MIC*    AMPICILLIN >=32 RESISTANT Resistant     CIPROFLOXACIN 2 RESISTANT Resistant     GENTAMICIN <=1 SENSITIVE Sensitive      IMIPENEM 2 SENSITIVE Sensitive     NITROFURANTOIN 128 RESISTANT Resistant     TRIMETH/SULFA >=320 RESISTANT Resistant     AMPICILLIN/SULBACTAM <=2 SENSITIVE Sensitive     PIP/TAZO <=4 SENSITIVE Sensitive ug/mL    * >=100,000 COLONIES/mL MORGANELLA MORGANII     Labs: Basic Metabolic Panel: Recent Labs  Lab 12/22/23 0835 12/23/23 0826 12/24/23 0607 12/25/23 0559 12/26/23 0524  NA 138 136 134* 140 140  K 4.0 3.6 3.2* 4.2 4.0  CL 104 101 102 106 105  CO2 24 24 23 24 25   GLUCOSE 93 82 103* 98 104*  BUN 9 12 13 18 17   CREATININE 0.71 0.76 0.58 0.65 0.50  CALCIUM 9.2 8.8* 8.6* 8.5* 8.4*  MG  --   --   --  2.1 2.0  PHOS  --   --   --  4.4 4.0   Liver Function Tests: Recent Labs  Lab 12/22/23 0835 12/23/23 0826 12/25/23 0559 12/26/23 0524  AST 26 24 24 15   ALT 16 18 14 13   ALKPHOS 123 131* 97 94  BILITOT 0.9 1.5* 0.6 0.6  PROT 7.0 7.4 5.9* 5.7*  ALBUMIN 3.9 4.3 3.4* 3.1*   Recent Labs  Lab 12/22/23 0835  LIPASE 27   No results for input(s): "AMMONIA" in the last 168 hours. CBC: Recent Labs  Lab 12/22/23 0835 12/23/23 0826 12/24/23 0607 12/25/23 0559 12/26/23 0524  WBC 5.3 5.2 6.0 2.9* 3.7*  NEUTROABS 4.0 4.1  --  1.6* 2.3  HGB 14.8 14.7 13.2 14.7 13.4  HCT 43.2 44.8 39.7 45.0 42.5  MCV 88.7 91.6 90.2 92.0 96.2  PLT 167 180 177 105* 161   Cardiac Enzymes: No results for input(s): "CKTOTAL", "CKMB", "CKMBINDEX", "TROPONINI" in the last 168 hours. BNP: BNP (last 3 results) No results for input(s): "BNP" in the last 8760 hours.  ProBNP (last 3 results) No results for input(s): "PROBNP" in the last 8760 hours.  CBG: No results for input(s): "GLUCAP" in the last 168 hours.     Signed:  Lacretia Nicks MD.  [facility] 12/27/2023, 9:54 AM

## 2023-12-27 NOTE — Progress Notes (Signed)
 Report given to RN Tami in [facility].

## 2023-12-27 NOTE — TOC Initial Note (Deleted)
 Transition of Care [facility]) - Initial/Assessment Note    Patient Details  Name: Jordan [name] MRN: [id number] Date of Birth: June 28, 1966  [facility]) CM/SW Contact:    Diona Browner, LCSW Phone Number: 12/27/2023, 12:00 PM  Clinical Narrative:                 Pt medically ready to d/c to [facility] room 216B. Call to report [phone] given to RN. Pt notified of transfer. PTAR called at 11:25am. D/c packet w/ signed scripts left at nurses station.   Expected Discharge Plan: Skilled Nursing Facility Barriers to Discharge: Barriers Resolved   Patient Goals and CMS Choice Patient states their goals for this hospitalization and ongoing recovery are:: SNF CMS Medicare.gov Compare Post Acute Care list provided to:: Patient Choice offered to / list presented to : Patient [place] ownership interest in [facility].provided to:: Patient    Expected Discharge Plan and Services In-house Referral: Clinical Social Work Discharge Planning Services: NA Post Acute Care Choice: Skilled Nursing Facility Living arrangements for the past 2 months: Apartment Expected Discharge Date: 12/27/23               DME Arranged: N/A DME Agency: NA       HH Arranged: NA          Prior Living Arrangements/Services Living arrangements for the past 2 months: Apartment Lives with:: Self Patient language and need for interpreter reviewed:: Yes Do you feel safe going back to the place where you live?: Yes      Need for Family Participation in Patient Care: No (Comment) Care giver support system in place?: No (comment) Current home services: DME (BSC, rollator) Criminal Activity/Legal Involvement Pertinent to Current Situation/Hospitalization: No - Comment as needed  Activities of Daily Living   ADL Screening (condition at time of admission) Independently performs ADLs?: Yes (appropriate for developmental age) Is the patient deaf or have difficulty hearing?: No Does the  patient have difficulty seeing, even when wearing glasses/contacts?: No Does the patient have difficulty concentrating, remembering, or making decisions?: No  Permission Sought/Granted Permission sought to share information with : Facility Industrial/product designer granted to share information with : Yes, Verbal Permission Granted     Permission granted to share info w AGENCY: SNF's        Emotional Assessment Appearance:: Appears stated age Attitude/Demeanor/Rapport: Engaged Affect (typically observed): Accepting Orientation: : Oriented to Self, Oriented to Place, Oriented to  Time, Oriented to Situation Alcohol / Substance Use: Not Applicable Psych Involvement: No (comment)  Admission diagnosis:  UTI (urinary tract infection) [N39.0] Weakness generalized [R53.1] Acute cystitis with hematuria [N30.01] Patient Active Problem List   Diagnosis Date Noted   UTI (urinary tract infection) 12/23/2023   Closed fracture of right tibia and fibula 09/13/2023   Closed displaced fracture of first metatarsal bone of right foot 09/13/2023   Thoracic radiculitis 02/10/2023   Schizophrenia (HCC) 02/10/2023   Pain in thoracic spine 02/10/2023   Moderate intellectual disability 02/10/2023   Lumbar radiculopathy 02/10/2023   Chronic pain 02/10/2023   Cystitis 01/16/2023   Dysuria 12/12/2022   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   Vitamin B12 deficiency 08/19/2022   Complicated UTI (urinary tract infection) 08/17/2022   Grief 04/17/2022   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   Menopausal symptoms 05/02/2021   Urge incontinence of urine 12/26/2020   Leukopenia    Vitamin D deficiency    Acute lower UTI    Morbid obesity with BMI of 40.0-44.9, adult (  HCC) 06/11/2019   Vaginal cancer (HCC) 05/28/2019   Visit for routine gyn exam 04/21/2019   Vaginal atrophy 04/21/2019   Urinary tract infection with hematuria 07/01/2015   Diastolic dysfunction 07/01/2015   Constipation 05/12/2015   ESBL (extended  spectrum beta-lactamase) producing bacteria infection 03/17/2015   Weakness generalized 03/02/2015   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 [place] [address] Phone: [phone] Fax: [phone]  [facility] [address] [place] [address] Phone: [phone] Fax: [phone]     Social Drivers of Health (SDOH) Social History: SDOH Screenings   Food Insecurity: No Food Insecurity (12/23/2023)  Housing: Low Risk  (12/23/2023)  Transportation Needs: No Transportation Needs (12/23/2023)  Utilities: Not At Risk (12/23/2023)  Depression (PHQ2-9): Low Risk  (07/16/2023)  Tobacco Use: Low Risk  (12/23/2023)   SDOH Interventions:     Readmission Risk Interventions    12/26/2023    3:11 PM 12/26/2023    2:48 PM 12/25/2023   10:59 AM  Readmission Risk Prevention Plan  Transportation Screening Complete Complete Complete  PCP or Specialist Appt within 5-7 Days Complete Complete Complete  Home Care Screening  Complete Complete  Medication Review (RN CM)  Complete Complete

## 2023-12-27 NOTE — TOC Transition Note (Signed)
 Transition of Care [facility]) - Discharge Note   Patient Details  Name: Jordan [name] MRN: [id number] Date of Birth: 12/24/1965  Transition [facility] Care [facility]) CM/SW Contact:  Diona Browner, LCSW Phone Number: 12/27/2023, 12:04 PM   Clinical Narrative:    Pt medically ready to d/c to [facility] room 216B. Call to report [phone] given to RN. Pt notified of transfer. PTAR called at 11:25am. D/c packet w/ signed scripts left at nurses station.   Final next level of care: Skilled Nursing Facility Barriers to Discharge: Barriers Resolved   Patient Goals and CMS Choice Patient states their goals for this hospitalization and ongoing recovery are:: SNF CMS Medicare.gov Compare Post Acute Care list provided to:: Patient Choice offered to / list presented to : Patient [place] ownership interest in [facility].provided to:: Patient    Discharge Placement PASRR number recieved: 12/25/23            Patient chooses bed at: Other - please specify in the comment section below: [facility]) Patient to be transferred to facility by: PTAR Name of family member notified: NA Patient and family notified of of transfer: 12/27/23  Discharge Plan and Services Additional resources added to the After Visit Summary for   In-house Referral: Clinical Social Work Discharge Planning Services: NA Post Acute Care Choice: Skilled Nursing Facility          DME Arranged: N/A DME Agency: NA       HH Arranged: NA          Social Drivers of Health (SDOH) Interventions SDOH Screenings   Food Insecurity: No Food Insecurity (12/23/2023)  Housing: Low Risk  (12/23/2023)  Transportation Needs: No Transportation Needs (12/23/2023)  Utilities: Not At Risk (12/23/2023)  Depression (PHQ2-9): Low Risk  (07/16/2023)  Tobacco Use: Low Risk  (12/23/2023)     Readmission Risk Interventions    12/26/2023    3:11 PM 12/26/2023    2:48 PM 12/25/2023   10:59 AM  Readmission Risk Prevention Plan   Transportation Screening Complete Complete Complete  PCP or Specialist Appt within 5-7 Days Complete Complete Complete  Home Care Screening  Complete Complete  Medication Review (RN CM)  Complete Complete

## 2023-12-29 ENCOUNTER — Ambulatory Visit: Payer: MEDICAID | Admitting: Radiation Oncology

## 2023-12-29 ENCOUNTER — Telehealth: Payer: Self-pay | Admitting: Radiation Oncology

## 2023-12-29 NOTE — Telephone Encounter (Signed)
 LVM r/s missed appt from 3/10 to 3/20@2 :30pm. Left c/b number if pt needs appt changed.

## 2024-01-01 ENCOUNTER — Ambulatory Visit: Payer: MEDICAID | Admitting: Clinical

## 2024-01-01 ENCOUNTER — Telehealth: Payer: Self-pay

## 2024-01-01 NOTE — Telephone Encounter (Signed)
 Returned her call and left a message with her next appt date and time for injection.

## 2024-01-12 ENCOUNTER — Ambulatory Visit: Admission: RE | Admit: 2024-01-12 | Payer: MEDICAID | Source: Ambulatory Visit | Admitting: Radiation Oncology

## 2024-01-12 ENCOUNTER — Encounter: Payer: Self-pay | Admitting: General Practice

## 2024-01-12 NOTE — Progress Notes (Signed)
[facility] Spiritual Care Note  Received and returned voicemail from [place].   Chaplain Lisa Lundeen, MDiv, [facility] Pager [phone] Voicemail [phone] 

## 2024-01-19 ENCOUNTER — Emergency Department (HOSPITAL_COMMUNITY)
Admission: EM | Admit: 2024-01-19 | Discharge: 2024-01-19 | Disposition: A | Discharge status: Home / Self Care | Payer: MEDICAID | Attending: Emergency Medicine | Admitting: Emergency Medicine

## 2024-01-19 ENCOUNTER — Other Ambulatory Visit: Payer: Self-pay

## 2024-01-19 ENCOUNTER — Emergency Department (HOSPITAL_COMMUNITY): Payer: MEDICAID

## 2024-01-19 DIAGNOSIS — D72829 Elevated white blood cell count, unspecified: Secondary | ICD-10-CM | POA: Diagnosis not present

## 2024-01-19 DIAGNOSIS — R110 Nausea: Secondary | ICD-10-CM | POA: Diagnosis present

## 2024-01-19 DIAGNOSIS — J180 Bronchopneumonia, unspecified organism: Secondary | ICD-10-CM | POA: Diagnosis not present

## 2024-01-19 DIAGNOSIS — N3000 Acute cystitis without hematuria: Secondary | ICD-10-CM | POA: Diagnosis not present

## 2024-01-19 LAB — URINALYSIS, W/ REFLEX TO CULTURE (INFECTION SUSPECTED)
Bilirubin Urine: NEGATIVE
Glucose, UA: NEGATIVE mg/dL
Ketones, ur: NEGATIVE mg/dL
Nitrite: NEGATIVE
Protein, ur: 100 mg/dL — AB
Specific Gravity, Urine: 1.013 (ref 1.005–1.030)
pH: 9 — ABNORMAL HIGH (ref 5.0–8.0)

## 2024-01-19 LAB — COMPREHENSIVE METABOLIC PANEL WITH GFR
ALT: 25 U/L (ref 0–44)
AST: 27 U/L (ref 15–41)
Albumin: 3 g/dL — ABNORMAL LOW (ref 3.5–5.0)
Alkaline Phosphatase: 95 U/L (ref 38–126)
Anion gap: 8 (ref 5–15)
BUN: 10 mg/dL (ref 6–20)
CO2: 23 mmol/L (ref 22–32)
Calcium: 7.8 mg/dL — ABNORMAL LOW (ref 8.9–10.3)
Chloride: 108 mmol/L (ref 98–111)
Creatinine, Ser: 0.57 mg/dL (ref 0.44–1.00)
GFR, Estimated: >60 mL/min (ref >60)
Glucose, Bld: 92 mg/dL (ref 70–99)
Potassium: 4 mmol/L (ref 3.5–5.1)
Sodium: 139 mmol/L (ref 135–145)
Total Bilirubin: 1.1 mg/dL (ref 0.0–1.2)
Total Protein: 5.4 g/dL — ABNORMAL LOW (ref 6.5–8.1)

## 2024-01-19 LAB — CBC WITH DIFFERENTIAL/PLATELET
Abs Immature Granulocytes: 0.05 10*3/uL (ref 0.00–0.07)
Basophils Absolute: 0 10*3/uL (ref 0.0–0.1)
Basophils Relative: 0 %
Eosinophils Absolute: 0 10*3/uL (ref 0.0–0.5)
Eosinophils Relative: 0 %
HCT: 39.2 % (ref 36.0–46.0)
Hemoglobin: 12.9 g/dL (ref 12.0–15.0)
Immature Granulocytes: 0 %
Lymphocytes Relative: 9 %
Lymphs Abs: 1.2 10*3/uL (ref 0.7–4.0)
MCH: 30.1 pg (ref 26.0–34.0)
MCHC: 32.9 g/dL (ref 30.0–36.0)
MCV: 91.6 fL (ref 80.0–100.0)
Monocytes Absolute: 0.8 10*3/uL (ref 0.1–1.0)
Monocytes Relative: 6 %
Neutro Abs: 11.5 10*3/uL — ABNORMAL HIGH (ref 1.7–7.7)
Neutrophils Relative %: 85 %
Platelets: 168 10*3/uL (ref 150–400)
RBC: 4.28 MIL/uL (ref 3.87–5.11)
RDW: 13.3 % (ref 11.5–15.5)
WBC: 13.5 10*3/uL — ABNORMAL HIGH (ref 4.0–10.5)
nRBC: 0 % (ref 0.0–0.2)

## 2024-01-19 LAB — TROPONIN I (HIGH SENSITIVITY): Troponin I (High Sensitivity): 12 ng/L (ref <18)

## 2024-01-19 MED ORDER — AZITHROMYCIN 250 MG PO TABS
500.0000 mg | ORAL_TABLET | Freq: Once | ORAL | Status: AC
  Administered 2024-01-19: 500 mg via ORAL
  Filled 2024-01-19: qty 2

## 2024-01-19 MED ORDER — CEPHALEXIN 500 MG PO CAPS
500.0000 mg | ORAL_CAPSULE | Freq: Once | ORAL | Status: AC
  Administered 2024-01-19: 500 mg via ORAL
  Filled 2024-01-19: qty 1

## 2024-01-19 MED ORDER — SODIUM CHLORIDE 0.9 % IV SOLN: 1.0000 g | Freq: Once | INTRAVENOUS | Status: DC

## 2024-01-19 MED ORDER — IOHEXOL 350 MG/ML SOLN
75.0000 mL | Freq: Once | INTRAVENOUS | Status: AC | PRN
  Administered 2024-01-19: 75 mL via INTRAVENOUS

## 2024-01-19 MED ORDER — SODIUM CHLORIDE 0.9 % IV BOLUS
1000.0000 mL | Freq: Once | INTRAVENOUS | Status: AC
  Administered 2024-01-19: 1000 mL via INTRAVENOUS

## 2024-01-19 MED ORDER — AZITHROMYCIN 250 MG PO TABS: 250.0000 mg | ORAL_TABLET | Freq: Every day | ORAL | 0 refills | Status: DC

## 2024-01-19 MED ORDER — CEPHALEXIN 500 MG PO CAPS: 500.0000 mg | ORAL_CAPSULE | Freq: Four times a day (QID) | ORAL | 0 refills | Status: DC

## 2024-01-19 MED ORDER — FOSFOMYCIN TROMETHAMINE 3 G PO PACK
3.0000 g | PACK | Freq: Once | ORAL | Status: AC
  Administered 2024-01-19: 3 g via ORAL
  Filled 2024-01-19: qty 3

## 2024-01-19 NOTE — ED Notes (Signed)
 Pt is in CT attempted to get temp.

## 2024-01-19 NOTE — ED Notes (Signed)
 Reviewed D/C information with the patient, pt verbalized understanding. No additional concerns at this time.

## 2024-01-19 NOTE — ED Provider Notes (Signed)
 [place] EMERGENCY DEPARTMENT AT [facility] Provider Note   CSN: [id number] Arrival date & time: 01/19/24  1415     History  Chief Complaint  Patient presents with   Chest Pain   Dizziness   Nausea    Jordan [name] is a 58 y.o. female.  Patient to ED from assisted living with history of schizophrenia, megaloblastic anemia, recent hospitalization for UTI discharge 3/8, recent ankle fracture (08/2023), who presents with SOB/DOE, pleuritic chest pain, and dizziness that started yesterday. She reports she has had dizziness in the past but it is worse today. Symptoms are worse with position. She endorses cough without fever. No vomiting.   The history is provided by the patient. No language interpreter was used.  Chest Pain Associated symptoms: dizziness   Dizziness Associated symptoms: chest pain        Home Medications Prior to Admission medications   Medication Sig Start Date End Date Taking? Authorizing Provider  azithromycin (ZITHROMAX Z-PAK) 250 MG tablet Take 1 tablet (250 mg total) by mouth daily. 01/19/24  Yes Maximus Hoffert, Melvenia Beam, PA-C  cephALEXin (KEFLEX) 500 MG capsule Take 1 capsule (500 mg total) by mouth 4 (four) times daily. 01/19/24  Yes Elpidio Anis, PA-C  acetaminophen (TYLENOL) 325 MG tablet Take 2 tablets (650 mg total) by mouth every 6 (six) hours as needed for mild pain (pain score 1-3), fever or headache. 09/17/23   [place] Bali, PA-C  cyanocobalamin (VITAMIN B12) 1000 MCG/ML injection Inject 1,000 mcg into the muscle every 30 (thirty) days.    [provider]  desvenlafaxine (PRISTIQ) 50 MG 24 hr tablet Take 50 mg by mouth every morning. Patient not taking: Reported on 12/23/2023 07/07/23   [provider]  gabapentin (NEURONTIN) 800 MG tablet Take 800 mg by mouth 3 (three) times daily as needed (for pain). 01/11/22   [provider]  loperamide (IMODIUM) 2 MG capsule Take 1 capsule (2 mg total) by mouth every 8 (eight)  hours as needed for diarrhea or loose stools. 09/29/23   Rai, Delene Ruffini, MD  Multiple Vitamin (MULTIVITAMIN WITH MINERALS) TABS tablet Take 1 tablet by mouth daily. Patient not taking: Reported on 12/23/2023 09/30/23   Rai, Delene Ruffini, MD  polyethylene glycol (MIRALAX / GLYCOLAX) 17 g packet Take 17 g by mouth daily. 12/27/23   Zigmund Daniel., MD      Allergies    Ceftriaxone, Ciprofloxacin, Citalopram, Lamotrigine, Sulfa antibiotics, and Tramadol    Review of Systems   Review of Systems  Cardiovascular:  Positive for chest pain.  Neurological:  Positive for dizziness.    Physical Exam Updated Vital Signs BP (!) 153/76   Pulse 100   Temp 98.2 F (36.8 C) (Oral)   Resp 18   Ht 5\' 8"  (1.727 m)   Wt (!) 142.9 kg   LMP  (LMP Unknown)   SpO2 95%   BMI 47.90 kg/m  Physical Exam Vitals and nursing note reviewed.  Constitutional:      Appearance: She is well-developed. She is obese. She is not ill-appearing.  HENT:     Head: Normocephalic.  Eyes:     Pupils: Pupils are equal, round, and reactive to light.  Cardiovascular:     Rate and Rhythm: Regular rhythm. Tachycardia present.     Heart sounds: No murmur heard. Pulmonary:     Effort: Pulmonary effort is normal. No respiratory distress.     Breath sounds: No stridor. Examination of the right-lower field reveals rhonchi.  Examination of the left-lower field reveals rhonchi. Rhonchi present. No wheezing or rales.  Abdominal:     Palpations: Abdomen is soft.     Tenderness: There is no abdominal tenderness. There is no guarding or rebound.  Musculoskeletal:        General: Normal range of motion.     Cervical back: Normal range of motion and neck supple.     Right lower leg: No edema.     Left lower leg: No edema.  Skin:    General: Skin is warm and dry.     Findings: No erythema.  Neurological:     General: No focal deficit present.     Mental Status: She is alert and oriented to person, place, and time.     ED  Results / Procedures / Treatments   Labs (all labs ordered are listed, but only abnormal results are displayed) Labs Reviewed  CBC WITH DIFFERENTIAL/PLATELET - Abnormal; Notable for the following components:      Result Value   WBC 13.5 (*)    Neutro Abs 11.5 (*)    All other components within normal limits  URINALYSIS, W/ REFLEX TO CULTURE (INFECTION SUSPECTED) - Abnormal; Notable for the following components:   APPearance HAZY (*)    pH 9.0 (*)    Hgb urine dipstick SMALL (*)    Protein, ur 100 (*)    Leukocytes,Ua TRACE (*)    Bacteria, UA MANY (*)    All other components within normal limits  COMPREHENSIVE METABOLIC PANEL WITH GFR - Abnormal; Notable for the following components:   Calcium 7.8 (*)    Total Protein 5.4 (*)    Albumin 3.0 (*)    All other components within normal limits  TROPONIN I (HIGH SENSITIVITY)  TROPONIN I (HIGH SENSITIVITY)   Results for orders placed or performed during the hospital encounter of 01/19/24  Urinalysis, w/ Reflex to Culture (Infection Suspected) -Urine, Clean Catch   Collection Time: 01/19/24  3:17 PM  Result Value Ref Range   Specimen Source URINE, CLEAN CATCH    Color, Urine YELLOW YELLOW   APPearance HAZY (A) CLEAR   Specific Gravity, Urine 1.013 1.005 - 1.030   pH 9.0 (H) 5.0 - 8.0   Glucose, UA NEGATIVE NEGATIVE mg/dL   Hgb urine dipstick SMALL (A) NEGATIVE   Bilirubin Urine NEGATIVE NEGATIVE   Ketones, ur NEGATIVE NEGATIVE mg/dL   Protein, ur 161 (A) NEGATIVE mg/dL   Nitrite NEGATIVE NEGATIVE   Leukocytes,Ua TRACE (A) NEGATIVE   RBC / HPF 11-20 0 - 5 RBC/hpf   WBC, UA 6-10 0 - 5 WBC/hpf   Bacteria, UA MANY (A) NONE SEEN   Squamous Epithelial / HPF 0-5 0 - 5 /HPF  CBC with Differential   Collection Time: 01/19/24  3:26 PM  Result Value Ref Range   WBC 13.5 (H) 4.0 - 10.5 K/uL   RBC 4.28 3.87 - 5.11 MIL/uL   Hemoglobin 12.9 12.0 - 15.0 g/dL   HCT 09.6 04.5 - 40.9 %   MCV 91.6 80.0 - 100.0 fL   MCH 30.1 26.0 - 34.0 pg    MCHC 32.9 30.0 - 36.0 g/dL   RDW 81.1 91.4 - 78.2 %   Platelets 168 150 - 400 K/uL   nRBC 0.0 0.0 - 0.2 %   Neutrophils Relative % 85 %   Neutro Abs 11.5 (H) 1.7 - 7.7 K/uL   Lymphocytes Relative 9 %   Lymphs Abs 1.2 0.7 - 4.0 K/uL  Monocytes Relative 6 %   Monocytes Absolute 0.8 0.1 - 1.0 K/uL   Eosinophils Relative 0 %   Eosinophils Absolute 0.0 0.0 - 0.5 K/uL   Basophils Relative 0 %   Basophils Absolute 0.0 0.0 - 0.1 K/uL   Immature Granulocytes 0 %   Abs Immature Granulocytes 0.05 0.00 - 0.07 K/uL  Comprehensive metabolic panel with GFR   Collection Time: 01/19/24  4:53 PM  Result Value Ref Range   Sodium 139 135 - 145 mmol/L   Potassium 4.0 3.5 - 5.1 mmol/L   Chloride 108 98 - 111 mmol/L   CO2 23 22 - 32 mmol/L   Glucose, Bld 92 70 - 99 mg/dL   BUN 10 6 - 20 mg/dL   Creatinine, Ser 4.78 0.44 - 1.00 mg/dL   Calcium 7.8 (L) 8.9 - 10.3 mg/dL   Total Protein 5.4 (L) 6.5 - 8.1 g/dL   Albumin 3.0 (L) 3.5 - 5.0 g/dL   AST 27 15 - 41 U/L   ALT 25 0 - 44 U/L   Alkaline Phosphatase 95 38 - 126 U/L   Total Bilirubin 1.1 0.0 - 1.2 mg/dL   GFR, Estimated >29 >56 mL/min   Anion gap 8 5 - 15  Troponin I (High Sensitivity)   Collection Time: 01/19/24  4:53 PM  Result Value Ref Range   Troponin I (High Sensitivity) 12 <18 ng/L   *Note: Due to a large number of results and/or encounters for the requested time period, some results have not been displayed. A complete set of results can be found in Results Review.    EKG EKG Interpretation Date/Time:  Monday January 19 2024 14:30:55 EDT Ventricular Rate:  115 PR Interval:  179 QRS Duration:  68 QT Interval:  297 QTC Calculation: 411 R Axis:   46  Text Interpretation: Sinus tachycardia Abnormal R-wave progression, early transition Since last tracing rate faster Confirmed by Jacalyn Lefevre [phone]) on 01/19/2024 3:09:16 PM  Radiology CT Angio Chest PE W and/or Wo Contrast Result Date: 01/19/2024 CLINICAL DATA:  Nausea,  dizziness, chest pain.  PE suspected EXAM: CT ANGIOGRAPHY CHEST WITH CONTRAST TECHNIQUE: Multidetector CT imaging of the chest was performed using the standard protocol during bolus administration of intravenous contrast. Multiplanar CT image reconstructions and MIPs were obtained to evaluate the vascular anatomy. RADIATION DOSE REDUCTION: This exam was performed according to the departmental dose-optimization program which includes automated exposure control, adjustment of the mA and/or kV according to patient size and/or use of iterative reconstruction technique. CONTRAST:  75mL OMNIPAQUE IOHEXOL 350 MG/ML SOLN COMPARISON:  Same day radiograph and CTA chest 04/28/2017 FINDINGS: Cardiovascular: Negative for acute pulmonary embolism noting decreased sensitivity for segmental or subsegmental pulmonary emboli due to respiratory motion. No pericardial effusion. Mild aortic atherosclerotic calcification. Coronary artery calcification. Mediastinum/Nodes: Bowing of the posterior trachea compatible with expiratory phase imaging. Esophagus is unremarkable. No thoracic adenopathy. Lungs/Pleura: Bronchial wall thickening and mucous plugging in the lower lobes. Associated peribronchovascular consolidation and ground-glass opacities. Geographic ground-glass opacities in the right-greater-than-left upper lobes likely due to a combination of infection and air trapping. No pleural effusion or pneumothorax. Upper Abdomen: Hepatic steatosis. Splenomegaly. No acute abnormality. Musculoskeletal: No acute fracture. Review of the MIP images confirms the above findings. IMPRESSION: 1. Negative for acute pulmonary embolism. 2. Findings compatible with bronchopneumonia greatest in the lower lobes. 3. Hepatic steatosis. 4. Splenomegaly. 5. Aortic Atherosclerosis (ICD10-I70.0). Electronically Signed   By: Minerva Fester M.D.   On: 01/19/2024 19:37   DG  Chest Portable 1 View Result Date: 01/19/2024 CLINICAL DATA:  Nausea, dizziness, chest  pain EXAM: PORTABLE CHEST 1 VIEW COMPARISON:  09/13/2023 FINDINGS: Single frontal view of the chest demonstrates an unremarkable cardiac silhouette. Increased pulmonary vascular congestion, without acute airspace disease, effusion, or pneumothorax. No acute bony abnormalities. IMPRESSION: 1. Pulmonary vascular congestion without overt edema. Electronically Signed   By: Sharlet Salina M.D.   On: 01/19/2024 15:59    Procedures Procedures    Medications Ordered in ED Medications  sodium chloride 0.9 % bolus 1,000 mL (0 mLs Intravenous Stopped 01/19/24 1729)  iohexol (OMNIPAQUE) 350 MG/ML injection 75 mL (75 mLs Intravenous Contrast Given 01/19/24 1848)  fosfomycin (MONUROL) packet 3 g (3 g Oral Given 01/19/24 2015)  azithromycin (ZITHROMAX) tablet 500 mg (500 mg Oral Given 01/19/24 2016)  cephALEXin (KEFLEX) capsule 500 mg (500 mg Oral Given 01/19/24 2016)    ED Course/ Medical Decision Making/ A&P                                 Medical Decision Making This patient presents to the ED for concern of DOE, this involves an extensive number of treatment options, and is a complaint that carries with it a high risk of complications and morbidity.  The differential diagnosis includes PE, PTX, CHF, ACS, PNA   Co morbidities that complicate the patient evaluation  Morbid obesity, schizophrenia,    Additional history obtained:  Additional history and/or information obtained from chart review, notable for review of recent discharge note 3/8 - dizziness during admission ongoing but work up unrevealing, to be continued in outpatient setting.    Lab Tests:  I Ordered, and personally interpreted labs.  The pertinent results include:  WBC 13.5, hgb normal; UA c/w infection w/many bacteria WBC 6-10 (culture pending)    Imaging Studies ordered:  I ordered imaging studies including CXR I independently visualized and interpreted imaging which showed vascular congestion without infiltrates CTA PE  study for high suspicion PE - per radiology, bronchopneumonia in lower lobes, no PE    Cardiac Monitoring:  The patient was maintained on a cardiac monitor.  I personally viewed and interpreted the cardiac monitored which showed an underlying rhythm of: sinus tachycardia     Test Considered:  CTA PE - tachycardia, pleuritic pain, DOE   Critical Interventions:  none   Consultations Obtained:  I requested consultation with the non,  and discussed lab and imaging findings as well as pertinent plan - they recommend: n/a   Problem List / ED Course:  Reports SOB/DOE and pleuritic chest pain. No fever, vomiting Has dizziness that is worse than her chronic dizziness IV fluids provided with improvement in tachycardia. Discussed allergy list with pharmacy -  Rocephin had been given in the last month with development of rash only (listed as anaphylaxis). Patient is comfortable with taking Keflex orally as suggested by pharmacy should be fine. Will give Keflex and Azithromycin for PNA. Will give single dose fosfomycin for symptomatic UTI pending review of cultures   Reevaluation:  After the interventions noted above, I reevaluated the patient and found that they have :improved   Social Determinants of Health:  Assisted living resident   Disposition:  After consideration of the diagnostic results and the patients response to treatment, I feel that the patient would benefit from discharge back to facility.   Amount and/or Complexity of Data Reviewed Labs: ordered. Radiology: ordered.  Risk Prescription  drug management.           Final Clinical Impression(s) / ED Diagnoses Final diagnoses:  Bronchopneumonia  Acute cystitis without hematuria    Rx / DC Orders ED Discharge Orders          Ordered    azithromycin (ZITHROMAX Z-PAK) 250 MG tablet  Daily        01/19/24 2023    cephALEXin (KEFLEX) 500 MG capsule  4 times daily        01/19/24 2023               Elpidio Anis, PA-C 01/19/24 2025    Anders Simmonds T, DO 01/19/24 2319

## 2024-01-19 NOTE — Discharge Instructions (Signed)
 Your labs and xrays today show that you have a community acquired pneumonia that is being treated with prescribed antibiotics. There is also some bacteria in your urine which is considered a urinary tract infection that was treated in the emergency department with Fosfomycin single dose. Your doctor should review the culture results in 2-3 days to ensure you received adequate treatment.

## 2024-01-19 NOTE — ED Triage Notes (Signed)
 Patient to ED by EMS from [name] with c/o Nausea, dizziness and chest pain when she takes a deep breath. She states pain started today. EMS started 22g in L hand and gave 500cc of NS.  128/84 110 94% CBG: 111

## 2024-01-19 NOTE — ED Notes (Signed)
 Pt back from CT

## 2024-01-19 NOTE — ED Notes (Signed)
Pt provided a sandwich and water

## 2024-01-20 ENCOUNTER — Encounter: Payer: Self-pay | Admitting: General Practice

## 2024-01-20 NOTE — Progress Notes (Signed)
[facility] Spiritual Care Note  Received and returned voicemail from [place].   Chaplain Lisa Lundeen, MDiv, [facility] Pager [phone] Voicemail [phone] 

## 2024-01-26 ENCOUNTER — Encounter: Payer: Self-pay | Admitting: Hematology and Oncology

## 2024-02-01 NOTE — Progress Notes (Signed)
 Radiation Oncology         ([phone]) [phone] ________________________________  Name: Jordan [name] MRN: [id number]  Date: 02/02/2024  DOB: Apr 04, 1966  Follow-Up Visit Note  CC: Earlis Glimpse, NP  Earlis Glimpse, NP  No diagnosis found.  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 Although she was seen by nursing here this past July and November for management of dysuria, she was last seen by myself for a follow-up visit on 03/13/23.   In addition to repetitive issues with dysuria and UTIs in the interval since her last visit, she has continued to routinely meet with behavioral health for management of her bipolar affective disorder and to receive support pertaining to her psychosocial stressors.  Pertaining to her recurrent UTI's/urinary symptoms, she established care with a new Urogynecologist this past September 2024, Dr. Aron Lard. She has also continued to meet routinely with her OB/GYN, Dr. Elester Grim. She has had numerous visits with both of these providers since her last follow-up visit, with each visit usually being related to the above in addition to issues with vaginal odor. She was seen by our nursing staff for this in November and UA and cultures were obtained which came back positive for UTI (managed accordingly).  She has also continued to meet with Dr. Marton Sleeper and receive monthly B12 injections in the setting of severe pancytopenia.  In most recent history (in 2025), she has several hospital hospital encounters; briefly outlined as follows:  -- ED 12/22/23: Presented to the ED with abdominal pain, dysuria, nausea, vomiting, and diarrhea.  A CT AP  was performed which showed mild circumferential bladder wall thickening, concerning for cystitis. She was ultimately discharged with a fosfomycin rx. Referrals to social work were also placed to arrange to SNF placement at the patient's request.  -- Admission 12/23/23 - 12/27/23: Presented with weakness and concerns for UTI with persistent pelvic pain. Admission was prompted due to c/o weakness and dizziness secondary to prolonged UTI. An MRI of the brain was subsequently performed which showed unremarkable findings. Hospital course overall consisted of abx for UTI. -- ED 01/19/24: Presented with SOB/DOE, pleuritic chest pain, and dizziness which began the day prior. A CTA of the chest was performed to rule out PE which showed evidence of bronchopneumonia in the bilateral lower lobes and no evidence of PE. She was subsequently prescribed Keflex and Azithromycin for PNA. She was also given a single dose of fosfomycin for symptomatic UTI pending review of cultures,  ***   Allergies:  is allergic to ceftriaxone, ciprofloxacin, citalopram, lamotrigine, sulfa antibiotics, and tramadol.  Meds: Current Outpatient Medications  Medication Sig Dispense Refill   acetaminophen (TYLENOL) 325 MG tablet Take 2 tablets (650 mg total) by mouth every 6 (six) hours as needed for mild pain (pain score 1-3), fever or headache.     azithromycin (ZITHROMAX Z-PAK) 250 MG tablet Take 1 tablet (250 mg total) by mouth daily. 4 tablet 0   cephALEXin (KEFLEX) 500 MG capsule Take 1 capsule (500 mg total) by mouth 4 (four) times daily. 40 capsule 0   cyanocobalamin (VITAMIN B12) 1000 MCG/ML injection Inject 1,000 mcg into the muscle every 30 (thirty) days.  desvenlafaxine (PRISTIQ) 50 MG 24 hr tablet Take 50 mg by mouth every morning. (Patient not taking: Reported on 12/23/2023)     gabapentin (NEURONTIN) 800 MG tablet Take 800 mg by mouth 3 (three) times daily as needed (for pain).     loperamide (IMODIUM) 2 MG capsule Take 1  capsule (2 mg total) by mouth every 8 (eight) hours as needed for diarrhea or loose stools.     Multiple Vitamin (MULTIVITAMIN WITH MINERALS) TABS tablet Take 1 tablet by mouth daily. (Patient not taking: Reported on 12/23/2023)     polyethylene glycol (MIRALAX / GLYCOLAX) 17 g packet Take 17 g by mouth daily. 14 each 0   No current facility-administered medications for this encounter.    Physical Findings: The patient is in no acute distress. Patient is alert and oriented.  vitals were not taken for this visit. .  No significant changes. Lungs are clear to auscultation bilaterally. Heart has regular rate and rhythm. No palpable cervical, supraclavicular, or axillary adenopathy. Abdomen soft, non-tender, normal bowel sounds.  On pelvic examination the external genitalia were unremarkable. A speculum exam was performed. There are no mucosal lesions noted in the vaginal vault. A Pap smear was obtained of the proximal vagina. On bimanual and rectovaginal examination there were no pelvic masses appreciated. ***   Lab Findings: Lab Results  Component Value Date   WBC 13.5 (H) 01/19/2024   HGB 12.9 01/19/2024   HCT 39.2 01/19/2024   MCV 91.6 01/19/2024   PLT 168 01/19/2024    Radiographic Findings: CT Angio Chest PE W and/or Wo Contrast Result Date: 01/19/2024 CLINICAL DATA:  Nausea, dizziness, chest pain.  PE suspected EXAM: CT ANGIOGRAPHY CHEST WITH CONTRAST TECHNIQUE: Multidetector CT imaging of the chest was performed using the standard protocol during bolus administration of intravenous contrast. Multiplanar CT image reconstructions and MIPs were obtained to evaluate the vascular anatomy. RADIATION DOSE REDUCTION: This exam was performed according to the departmental dose-optimization program which includes automated exposure control, adjustment of the mA and/or kV according to patient size and/or use of iterative reconstruction technique. CONTRAST:  75mL OMNIPAQUE IOHEXOL 350 MG/ML SOLN  COMPARISON:  Same day radiograph and CTA chest 04/28/2017 FINDINGS: Cardiovascular: Negative for acute pulmonary embolism noting decreased sensitivity for segmental or subsegmental pulmonary emboli due to respiratory motion. No pericardial effusion. Mild aortic atherosclerotic calcification. Coronary artery calcification. Mediastinum/Nodes: Bowing of the posterior trachea compatible with expiratory phase imaging. Esophagus is unremarkable. No thoracic adenopathy. Lungs/Pleura: Bronchial wall thickening and mucous plugging in the lower lobes. Associated peribronchovascular consolidation and ground-glass opacities. Geographic ground-glass opacities in the right-greater-than-left upper lobes likely due to a combination of infection and air trapping. No pleural effusion or pneumothorax. Upper Abdomen: Hepatic steatosis. Splenomegaly. No acute abnormality. Musculoskeletal: No acute fracture. Review of the MIP images confirms the above findings. IMPRESSION: 1. Negative for acute pulmonary embolism. 2. Findings compatible with bronchopneumonia greatest in the lower lobes. 3. Hepatic steatosis. 4. Splenomegaly. 5. Aortic Atherosclerosis (ICD10-I70.0). Electronically Signed   By: Rozell Cornet M.D.   On: 01/19/2024 19:37   DG Chest Portable 1 View Result Date: 01/19/2024 CLINICAL DATA:  Nausea, dizziness, chest pain EXAM: PORTABLE CHEST 1 VIEW COMPARISON:  09/13/2023 FINDINGS: Single frontal view of the chest demonstrates an unremarkable cardiac silhouette. Increased pulmonary vascular congestion, without acute airspace disease, effusion, or pneumothorax. No acute bony abnormalities. IMPRESSION: 1. Pulmonary vascular congestion without overt edema. Electronically Signed   By: Bobbye Burrow M.D.   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  ***  Plan:  ***   *** minutes of total time was spent for  this patient encounter, including preparation, face-to-face counseling with the patient and coordination of care, physical exam, and documentation of the encounter. ____________________________________  Noralee Beam, PhD, MD  This document serves as a record of services personally performed by Retta Caster, MD. It was created on his behalf by Aleta Anda, a trained medical scribe. The creation of this record is based on the scribe's personal observations and the provider's statements to them. This document has been checked and approved by the attending provider.

## 2024-02-02 ENCOUNTER — Encounter: Payer: Self-pay | Admitting: Radiation Oncology

## 2024-02-02 ENCOUNTER — Ambulatory Visit
Admission: RE | Admit: 2024-02-02 | Discharge: 2024-02-02 | Disposition: A | Discharge status: Home / Self Care | Payer: MEDICAID | Source: Ambulatory Visit | Attending: Radiation Oncology | Admitting: Radiation Oncology

## 2024-02-02 ENCOUNTER — Other Ambulatory Visit: Payer: Self-pay

## 2024-02-02 ENCOUNTER — Encounter: Payer: Self-pay | Admitting: *Deleted

## 2024-02-02 ENCOUNTER — Ambulatory Visit: Attending: Nurse Practitioner

## 2024-02-02 VITALS — BP 144/83 | HR 98 | Temp 97.5°F | Resp 18 | Ht 68.0 in | Wt 333.8 lb

## 2024-02-02 DIAGNOSIS — Z923 Personal history of irradiation: Secondary | ICD-10-CM | POA: Insufficient documentation

## 2024-02-02 DIAGNOSIS — R0989 Other specified symptoms and signs involving the circulatory and respiratory systems: Secondary | ICD-10-CM | POA: Diagnosis not present

## 2024-02-02 DIAGNOSIS — R300 Dysuria: Secondary | ICD-10-CM | POA: Diagnosis not present

## 2024-02-02 DIAGNOSIS — Z8544 Personal history of malignant neoplasm of other female genital organs: Secondary | ICD-10-CM | POA: Diagnosis present

## 2024-02-02 DIAGNOSIS — F319 Bipolar disorder, unspecified: Secondary | ICD-10-CM | POA: Insufficient documentation

## 2024-02-02 DIAGNOSIS — K760 Fatty (change of) liver, not elsewhere classified: Secondary | ICD-10-CM | POA: Diagnosis not present

## 2024-02-02 DIAGNOSIS — E538 Deficiency of other specified B group vitamins: Secondary | ICD-10-CM | POA: Diagnosis not present

## 2024-02-02 DIAGNOSIS — C52 Malignant neoplasm of vagina: Secondary | ICD-10-CM

## 2024-02-02 DIAGNOSIS — I700 Atherosclerosis of aorta: Secondary | ICD-10-CM | POA: Insufficient documentation

## 2024-02-02 DIAGNOSIS — R161 Splenomegaly, not elsewhere classified: Secondary | ICD-10-CM | POA: Diagnosis not present

## 2024-02-02 DIAGNOSIS — D61818 Other pancytopenia: Secondary | ICD-10-CM | POA: Insufficient documentation

## 2024-02-02 LAB — URINALYSIS, ROUTINE W REFLEX MICROSCOPIC
Bilirubin Urine: NEGATIVE
Glucose, UA: NEGATIVE mg/dL
Ketones, ur: NEGATIVE mg/dL
Nitrite: NEGATIVE
Protein, ur: >=300 mg/dL — AB
RBC / HPF: >50 RBC/hpf (ref 0–5)
Specific Gravity, Urine: 1.022 (ref 1.005–1.030)
Squamous Epithelial / HPF: >50 /HPF (ref 0–5)
WBC, UA: >50 WBC/hpf (ref 0–5)
pH: 8 (ref 5.0–8.0)

## 2024-02-02 NOTE — Progress Notes (Addendum)
 Jordan [name] is here today for follow up post radiation to the pelvic.  They completed their radiation on: 09/22/2019   Does the patient complain of any of the following:  Pain:  Denies Abdominal bloating: Denies Diarrhea/Constipation: Denies Nausea/Vomiting: Denies Vaginal Discharge: Denies Blood in Urine or Stool: Denies Urinary Issues (dysuria/incomplete emptying/ incontinence/ increased frequency/urgency): Dysuria Does patient report using vaginal dilator 2-3 times a week and/or sexually active 2-3 weeks: Denies Post radiation skin changes: Denies   BP (!) 144/83 (BP Location: Left Arm, Patient Position: Sitting)   Pulse 98   Temp (!) 97.5 F (36.4 C) (Temporal)   Resp 18   Ht 5\' 8"  (1.727 m)   LMP  (LMP Unknown)   SpO2 94%   BMI 47.90 kg/m  :333.8 lbs

## 2024-02-03 NOTE — BH Specialist Note (Deleted)
 Integrated Behavioral Health via Telemedicine Visit  02/03/2024 Lilli Polczynski [id number]  Number of Integrated Behavioral Health Clinician visits: Additional Visit  Session Start time: 1448   Session End time: 1514  Total time in minutes: 26   Referring Provider: *** Patient/Family location: Home*** [facility] Provider location: [facility]  All persons participating in visit: Patient Jordan [name] and [facility] Markice Torbert ***  Types of Service: {CHL AMB TYPE OF [phone]}  I connected with [name] and/or Milly Gramlich's {family members:20773} via  Telephone or Engineer, civil (consulting)  (Video is Caregility application) and verified that I am speaking with the correct person using two identifiers. Discussed confidentiality: Yes   I discussed the limitations of telemedicine and the availability of in person appointments.  Discussed there is a possibility of technology failure and discussed alternative modes of communication if that failure occurs.  I discussed that engaging in this telemedicine visit, they consent to the provision of behavioral healthcare and the services will be billed under their insurance.  Patient and/or legal guardian expressed understanding and consented to Telemedicine visit: Yes   Presenting Concerns: Patient and/or family reports the following symptoms/concerns: *** Duration of problem: ***; Severity of problem: {Mild/Moderate/Severe:20260}  Patient and/or Family's Strengths/Protective Factors: {CHL AMB BH PROTECTIVE [phone]}  Goals Addressed: Patient will:  Reduce symptoms of: {IBH Symptoms:21014056}   Increase knowledge and/or ability of: {IBH Patient Tools:21014057}   Demonstrate ability to: {IBH Goals:21014053}  Progress towards Goals: {CHL AMB BH PROGRESS TOWARDS GOALS:3085962027}  Interventions: Interventions utilized:  {IBH  Interventions:21014054} Standardized Assessments completed: {IBH Screening Tools:21014051}  Patient and/or Family Response: Patient agrees with treatment plan.   Assessment: Patient currently experiencing ***.   Patient may benefit from continued therapeutic intervention *** .  Plan: Follow up with behavioral health clinician on : *** Behavioral recommendations:  -*** -*** Referral(s): {IBH Referrals:21014055}  I discussed the assessment and treatment plan with the patient and/or parent/guardian. They were provided an opportunity to ask questions and all were answered. They agreed with the plan and demonstrated an understanding of the instructions.   They were advised to call back or seek an in-person evaluation if the symptoms worsen or if the condition fails to improve as anticipated.  Elfredia Grippe Dovey Fatzinger, LCSW     07/16/2023    5:18 PM 03/27/2023    4:22 PM 07/18/2022    1:43 PM 04/17/2022   11:19 AM 08/20/2021    2:13 PM  Depression screen PHQ 2/9  Decreased Interest 0 0 1 0 0  Down, Depressed, Hopeless 0 1 1 1  0  PHQ - 2 Score 0 1 2 1  0  Altered sleeping 0 2 1 1  0  Tired, decreased energy 0 1 1 0 0  Change in appetite 0 0 1 0 0  Feeling bad or failure about yourself  1 0 0 2 0  Trouble concentrating 1 1  3  0  Moving slowly or fidgety/restless 1 1 1  0 0  Suicidal thoughts 0 0 0 0 0  PHQ-9 Score 3 6 6 7  0      07/16/2023    5:18 PM 03/27/2023    4:25 PM 07/18/2022    1:43 PM 04/17/2022   11:19 AM  GAD 7 : Generalized Anxiety Score  Nervous, Anxious, on Edge 1 2 1 1   Control/stop worrying 1 1 1  0  Worry too much - different things 1 1 1  0  Trouble relaxing 1 1 1  0  Restless 1 1 1  0  Easily annoyed or irritable 2 1 1 1   Afraid - awful might happen 0 1 0 1  Total GAD 7 Score 7 8 6  3

## 2024-02-05 ENCOUNTER — Other Ambulatory Visit: Payer: Self-pay | Admitting: Radiology

## 2024-02-05 ENCOUNTER — Other Ambulatory Visit (HOSPITAL_COMMUNITY): Payer: Self-pay

## 2024-02-05 LAB — URINE CULTURE: Culture: >=100000 — AB

## 2024-02-09 ENCOUNTER — Encounter: Payer: Self-pay | Admitting: Hematology and Oncology

## 2024-02-09 ENCOUNTER — Telehealth: Payer: Self-pay

## 2024-02-13 ENCOUNTER — Emergency Department (HOSPITAL_COMMUNITY)

## 2024-02-13 ENCOUNTER — Other Ambulatory Visit: Payer: Self-pay

## 2024-02-13 ENCOUNTER — Encounter (HOSPITAL_COMMUNITY): Payer: Self-pay | Admitting: Emergency Medicine

## 2024-02-13 ENCOUNTER — Emergency Department (HOSPITAL_COMMUNITY)
Admission: EM | Admit: 2024-02-13 | Discharge: 2024-02-13 | Disposition: A | Discharge status: Home / Self Care | Attending: Emergency Medicine | Admitting: Emergency Medicine

## 2024-02-13 DIAGNOSIS — M79672 Pain in left foot: Secondary | ICD-10-CM | POA: Insufficient documentation

## 2024-02-13 MED ORDER — OXYCODONE HCL 5 MG PO TABS
5.0000 mg | ORAL_TABLET | Freq: Once | ORAL | Status: AC
  Administered 2024-02-13: 5 mg via ORAL
  Filled 2024-02-13: qty 1

## 2024-02-13 NOTE — ED Notes (Signed)
 [facility] called

## 2024-02-13 NOTE — ED Notes (Signed)
 Spoke with Quin Brush at facility. Advised patient is coming back

## 2024-02-13 NOTE — ED Provider Notes (Signed)
 [place] EMERGENCY DEPARTMENT AT [facility] Provider Note   CSN: [id number] Arrival date & time: 02/13/24  1408     History Chief Complaint  Patient presents with   Foot Pain    HPI Jordan [name] is a 58 y.o. female presenting for chief complaint of left foot pain.  Acute on chronic.  Facility stated she had a minor trauma 3 days ago but patient does not recall this.  She endorses pain in her left foot. Patient's recorded medical, surgical, social, medication list and allergies were reviewed in the Snapshot window as part of the initial history.   Review of Systems   Review of Systems  Constitutional:  Negative for chills and fever.  HENT:  Negative for ear pain and sore throat.   Eyes:  Negative for pain and visual disturbance.  Respiratory:  Negative for cough and shortness of breath.   Cardiovascular:  Negative for chest pain and palpitations.  Gastrointestinal:  Negative for abdominal pain and vomiting.  Genitourinary:  Negative for dysuria and hematuria.  Musculoskeletal:  Negative for arthralgias and back pain.  Skin:  Negative for color change and rash.  Neurological:  Negative for seizures and syncope.  All other systems reviewed and are negative.   Physical Exam Updated Vital Signs BP 109/63 (BP Location: Left Arm)   Pulse 88   Temp 98.2 F (36.8 C) (Oral)   Resp 16   Ht 5\' 8"  (1.727 m)   Wt (!) 151.5 kg   LMP  (LMP Unknown)   SpO2 100%   BMI 50.78 kg/m  Physical Exam Vitals and nursing note reviewed.  Constitutional:      General: She is not in acute distress.    Appearance: She is well-developed.  HENT:     Head: Normocephalic and atraumatic.  Eyes:     Conjunctiva/sclera: Conjunctivae normal.  Cardiovascular:     Rate and Rhythm: Normal rate and regular rhythm.     Heart sounds: No murmur heard. Pulmonary:     Effort: Pulmonary effort is normal. No respiratory distress.     Breath sounds: Normal breath sounds.  Abdominal:      General: There is no distension.     Palpations: Abdomen is soft.     Tenderness: There is no abdominal tenderness. There is no right CVA tenderness or left CVA tenderness.  Musculoskeletal:        General: Tenderness (TTP left midfoot) present. No swelling or signs of injury. Normal range of motion.     Cervical back: Neck supple.  Skin:    General: Skin is warm and dry.  Neurological:     General: No focal deficit present.     Mental Status: She is alert and oriented to person, place, and time. Mental status is at baseline.     Cranial Nerves: No cranial nerve deficit.      ED Course/ Medical Decision Making/ A&P    Procedures Procedures   Medications Ordered in ED Medications  oxyCODONE  (Oxy IR/ROXICODONE ) immediate release tablet 5 mg (5 mg Oral Given 02/13/24 2142)    Medical Decision Making:   Jordan [name] is a 58 y.o. female who presented to the ED today with left foot pain detailed above.    Handoff received from EMS.  Patient placed on continuous vitals and telemetry monitoring while in ED which was reviewed periodically.  Complete initial physical exam performed, notably the patient  was hemodynamically stable no acute distress.    Reviewed and confirmed  nursing documentation for past medical history, family history, social history.    Initial Assessment:   With the patient's presentation of left foot pain, most likely diagnosis is nonspecific etiology. Other diagnoses were considered including (but not limited to) orthopedic injury. These are considered less likely due to history of present illness and physical exam findings.   This is most consistent with an acute complicated illness  Initial Plan:  X-ray left foot Objective evaluation as below reviewed   Initial Study Results:    Radiology:  All images reviewed independently. Agree with radiology report at this time.   DG Foot Complete Left Result Date: 02/13/2024 CLINICAL DATA:  Left foot pain after injury  3 days ago. EXAM: LEFT FOOT - COMPLETE 3+ VIEW COMPARISON:  May 27, 2013. FINDINGS: Nondisplaced fracture is seen involving the third middle phalanx with sclerotic margins suggesting subacute to old fracture. There is no evidence of arthropathy or other focal bone abnormality. Soft tissues are unremarkable. IMPRESSION: Subacute to old nondisplaced third middle phalangeal fracture. Electronically Signed   By: Rosalene Colon M.D.   On: 02/13/2024 16:17   CT Angio Chest PE W and/or Wo Contrast Result Date: 01/19/2024 CLINICAL DATA:  Nausea, dizziness, chest pain.  PE suspected EXAM: CT ANGIOGRAPHY CHEST WITH CONTRAST TECHNIQUE: Multidetector CT imaging of the chest was performed using the standard protocol during bolus administration of intravenous contrast. Multiplanar CT image reconstructions and MIPs were obtained to evaluate the vascular anatomy. RADIATION DOSE REDUCTION: This exam was performed according to the departmental dose-optimization program which includes automated exposure control, adjustment of the mA and/or kV according to patient size and/or use of iterative reconstruction technique. CONTRAST:  75mL OMNIPAQUE  IOHEXOL  350 MG/ML SOLN COMPARISON:  Same day radiograph and CTA chest 04/28/2017 FINDINGS: Cardiovascular: Negative for acute pulmonary embolism noting decreased sensitivity for segmental or subsegmental pulmonary emboli due to respiratory motion. No pericardial effusion. Mild aortic atherosclerotic calcification. Coronary artery calcification. Mediastinum/Nodes: Bowing of the posterior trachea compatible with expiratory phase imaging. Esophagus is unremarkable. No thoracic adenopathy. Lungs/Pleura: Bronchial wall thickening and mucous plugging in the lower lobes. Associated peribronchovascular consolidation and ground-glass opacities. Geographic ground-glass opacities in the right-greater-than-left upper lobes likely due to a combination of infection and air trapping. No pleural effusion  or pneumothorax. Upper Abdomen: Hepatic steatosis. Splenomegaly. No acute abnormality. Musculoskeletal: No acute fracture. Review of the MIP images confirms the above findings. IMPRESSION: 1. Negative for acute pulmonary embolism. 2. Findings compatible with bronchopneumonia greatest in the lower lobes. 3. Hepatic steatosis. 4. Splenomegaly. 5. Aortic Atherosclerosis (ICD10-I70.0). Electronically Signed   By: Rozell Cornet M.D.   On: 01/19/2024 19:37   DG Chest Portable 1 View Result Date: 01/19/2024 CLINICAL DATA:  Nausea, dizziness, chest pain EXAM: PORTABLE CHEST 1 VIEW COMPARISON:  09/13/2023 FINDINGS: Single frontal view of the chest demonstrates an unremarkable cardiac silhouette. Increased pulmonary vascular congestion, without acute airspace disease, effusion, or pneumothorax. No acute bony abnormalities. IMPRESSION: 1. Pulmonary vascular congestion without overt edema. Electronically Signed   By: Bobbye Burrow M.D.   On: 01/19/2024 15:59     Reassessment and Plan:   Patient with metatarsal fracture.  Will place into cam boot for stabilization and refer to her normal outpatient podiatrist for definitive management and reassessment.  Pain medication administered in the emergency room and patient monitored.   Patient already located in a rehab facility to continue outpatient care.  Disposition:  I have considered need for hospitalization, however, considering all of the above, I believe  this patient is stable for discharge at this time.  Patient/family educated about specific return precautions for given chief complaint and symptoms.  Patient/family educated about follow-up with PCP.     Patient/family expressed understanding of return precautions and need for follow-up. Patient spoken to regarding all imaging and laboratory results and appropriate follow up for these results. All education provided in verbal form with additional information in written form. Time was allowed for answering of  patient questions. Patient discharged.    Emergency Department Medication Summary:   Medications  oxyCODONE  (Oxy IR/ROXICODONE ) immediate release tablet 5 mg (5 mg Oral Given 02/13/24 2142)    Clinical Impression:  1. Left foot pain      Discharge   Final Clinical Impression(s) / ED Diagnoses Final diagnoses:  Left foot pain    Rx / DC Orders ED Discharge Orders     None         Onetha Bile, MD 02/13/24 2249

## 2024-02-13 NOTE — ED Triage Notes (Signed)
 Pt via [facility] from [facility] c/o left foot pain after bumping her leg on something three days ago. Pt has hx BLE edema so EMS is unable to assess any new swelling present. Vitals WNL en route. Pain rated 10/10. A/O x 4. Additional hx bipolar disorder and schizophrenia.

## 2024-02-13 NOTE — Progress Notes (Signed)
 Orthopedic Tech Progress Note Patient Details:  Jordan [name] May 09, 1966 [id number]  Ortho Devices Type of Ortho Device: CAM walker Ortho Device/Splint Location: LLE Ortho Device/Splint Interventions: Application   Post Interventions Patient Tolerated: Well  [name] [name] 02/13/2024, 6:31 PM

## 2024-02-16 ENCOUNTER — Encounter (HOSPITAL_COMMUNITY): Payer: Self-pay

## 2024-02-16 ENCOUNTER — Ambulatory Visit (HOSPITAL_COMMUNITY)

## 2024-02-16 ENCOUNTER — Telehealth: Payer: Self-pay | Admitting: Clinical

## 2024-02-16 ENCOUNTER — Other Ambulatory Visit: Payer: Self-pay

## 2024-02-16 ENCOUNTER — Emergency Department (HOSPITAL_COMMUNITY)
Admission: EM | Admit: 2024-02-16 | Discharge: 2024-02-16 | Disposition: A | Discharge status: Home / Self Care | Attending: Emergency Medicine | Admitting: Emergency Medicine

## 2024-02-16 DIAGNOSIS — M7989 Other specified soft tissue disorders: Secondary | ICD-10-CM | POA: Diagnosis not present

## 2024-02-16 DIAGNOSIS — R609 Edema, unspecified: Secondary | ICD-10-CM

## 2024-02-16 DIAGNOSIS — R600 Localized edema: Secondary | ICD-10-CM | POA: Diagnosis present

## 2024-02-16 LAB — COMPREHENSIVE METABOLIC PANEL WITH GFR
ALT: 16 U/L (ref 0–44)
AST: 23 U/L (ref 15–41)
Albumin: 3.5 g/dL (ref 3.5–5.0)
Alkaline Phosphatase: 141 U/L — ABNORMAL HIGH (ref 38–126)
Anion gap: 11 (ref 5–15)
BUN: 10 mg/dL (ref 6–20)
CO2: 23 mmol/L (ref 22–32)
Calcium: 8.5 mg/dL — ABNORMAL LOW (ref 8.9–10.3)
Chloride: 104 mmol/L (ref 98–111)
Creatinine, Ser: 0.45 mg/dL (ref 0.44–1.00)
GFR, Estimated: >60 mL/min (ref >60)
Glucose, Bld: 81 mg/dL (ref 70–99)
Potassium: 4.7 mmol/L (ref 3.5–5.1)
Sodium: 138 mmol/L (ref 135–145)
Total Bilirubin: 0.6 mg/dL (ref 0.0–1.2)
Total Protein: 6.7 g/dL (ref 6.5–8.1)

## 2024-02-16 LAB — CBC WITH DIFFERENTIAL/PLATELET
Abs Immature Granulocytes: 0.04 10*3/uL (ref 0.00–0.07)
Basophils Absolute: 0 10*3/uL (ref 0.0–0.1)
Basophils Relative: 0 %
Eosinophils Absolute: 0 10*3/uL (ref 0.0–0.5)
Eosinophils Relative: 0 %
HCT: 43.8 % (ref 36.0–46.0)
Hemoglobin: 13.8 g/dL (ref 12.0–15.0)
Immature Granulocytes: 1 %
Lymphocytes Relative: 24 %
Lymphs Abs: 0.8 10*3/uL (ref 0.7–4.0)
MCH: 30.2 pg (ref 26.0–34.0)
MCHC: 31.5 g/dL (ref 30.0–36.0)
MCV: 95.8 fL (ref 80.0–100.0)
Monocytes Absolute: 0.3 10*3/uL (ref 0.1–1.0)
Monocytes Relative: 10 %
Neutro Abs: 2.2 10*3/uL (ref 1.7–7.7)
Neutrophils Relative %: 65 %
Platelets: 154 10*3/uL (ref 150–400)
RBC: 4.57 MIL/uL (ref 3.87–5.11)
RDW: 13.4 % (ref 11.5–15.5)
WBC: 3.3 10*3/uL — ABNORMAL LOW (ref 4.0–10.5)
nRBC: 0 % (ref 0.0–0.2)

## 2024-02-16 MED ORDER — OXYCODONE-ACETAMINOPHEN 5-325 MG PO TABS
2.0000 | ORAL_TABLET | Freq: Once | ORAL | Status: AC
  Administered 2024-02-16: 2 via ORAL
  Filled 2024-02-16: qty 2

## 2024-02-16 MED ORDER — OXYCODONE-ACETAMINOPHEN 5-325 MG PO TABS: 1.0000 | ORAL_TABLET | ORAL | 0 refills | Status: AC | PRN

## 2024-02-16 NOTE — ED Provider Triage Note (Signed)
 Emergency Medicine Provider Triage Evaluation Note  Jordan [name] , a 58 y.o. female  was evaluated in triage.  Pt complains of swelling to left leg.  Pt reports she ws told that she had a fracture in her foot.  Pt reports whole leg is swollen and painful   Review of Systems  Positive: Swelling left leg Negative: No injury  Physical Exam  LMP  (LMP Unknown)  Gen:   Awake, uncomfortable Resp:  Normal effort  MSK:   Swollen left leg painful to touch Other:    Medical Decision Making  Medically screening exam initiated at 12:48 PM.  Appropriate orders placed.  Elika Kampf was informed that the remainder of the evaluation will be completed by another provider, this initial triage assessment does not replace that evaluation, and the importance of remaining in the ED until their evaluation is complete.     Sandi Crosby, [place] 02/16/24 1251

## 2024-02-16 NOTE — ED Provider Notes (Signed)
 Jordan [name] Provider Note   CSN: [id number] Arrival date & time: 02/16/24  1237     History {Add pertinent medical, surgical, social history, OB history to HPI:1} Chief Complaint  Patient presents with   Leg Pain    [name] is a 58 y.o. female.  Pt complains of swelling to her left leg.  Pt was seen here 3 days ago and was diagnosed with a fracture to her foot.  Pt complains of swelling to her leg.  Patient reports her whole leg is painful.  Patient states that she did not do anything to injure her leg.  Patient reports that she was told 2 different things on her previous visit she was told that she had a fracture of her toe and then told that she just had leg pain.  Patient denies any fever she denies any chills.  The history is provided by the patient. No language interpreter was used.  Leg Pain      Home Medications Prior to Admission medications   Medication Sig Start Date End Date Taking? Authorizing Provider  acetaminophen  (TYLENOL ) 325 MG tablet Take 2 tablets (650 mg total) by mouth every 6 (six) hours as needed for mild pain (pain score 1-3), fever or headache. 09/17/23   Versie Gores, PA-C  azithromycin  (ZITHROMAX  Z-PAK) 250 MG tablet Take 1 tablet (250 mg total) by mouth daily. Patient not taking: Reported on 02/02/2024 01/19/24   Mandy Second, PA-C  cephALEXin  (KEFLEX ) 500 MG capsule Take 1 capsule (500 mg total) by mouth 4 (four) times daily. 01/19/24   Mandy Second, PA-C  cyanocobalamin  (VITAMIN B12) 1000 MCG/ML injection Inject 1,000 mcg into the muscle every 30 (thirty) days.    [provider]  desvenlafaxine  (PRISTIQ ) 50 MG 24 hr tablet Take 50 mg by mouth every morning. 07/07/23   [provider]  gabapentin  (NEURONTIN ) 800 MG tablet Take 800 mg by mouth 3 (three) times daily as needed (for pain). 01/11/22   [provider]  loperamide  (IMODIUM ) 2 MG capsule Take 1 capsule (2 mg  total) by mouth every 8 (eight) hours as needed for diarrhea or loose stools. 09/29/23   Rai, Hurman Maiden, MD  Multiple Vitamin (MULTIVITAMIN WITH MINERALS) TABS tablet Take 1 tablet by mouth daily. 09/30/23   Rai, Hurman Maiden, MD  polyethylene glycol (MIRALAX  / GLYCOLAX ) 17 g packet Take 17 g by mouth daily. 12/27/23   Etter Hermann., MD      Allergies    Ceftriaxone , Ciprofloxacin , Citalopram , Lamotrigine , Sulfa  antibiotics, and Tramadol    Review of Systems   Review of Systems  Respiratory:  Negative for shortness of breath.   Cardiovascular:  Negative for chest pain.  All other systems reviewed and are negative.   Physical Exam Updated Vital Signs BP 121/66 (BP Location: Left Arm)   Pulse 84   Temp 97.8 F (36.6 C) (Oral)   Resp 18   Ht 5\' 8"  (1.727 m)   Wt (!) 151 kg   LMP  (LMP Unknown)   SpO2 99%   BMI 50.62 kg/m  Physical Exam Vitals reviewed.  Constitutional:      Appearance: Normal appearance.  HENT:     Head: Normocephalic.     Nose: Nose normal.     Mouth/Throat:     Mouth: Mucous membranes are moist.  Eyes:     Pupils: Pupils are equal, round, and reactive to light.  Cardiovascular:  Rate and Rhythm: Normal rate.  Pulmonary:     Effort: Pulmonary effort is normal.  Abdominal:     General: Abdomen is flat.  Musculoskeletal:        General: Swelling and tenderness present. Normal range of motion.     Comments: Swollen bilateral legs.  Left greater than right.  Skin:    General: Skin is warm.  Neurological:     General: No focal deficit present.     Mental Status: She is alert.  Psychiatric:        Mood and Affect: Mood normal.     ED Results / Procedures / Treatments   Labs (all labs ordered are listed, but only abnormal results are displayed) Labs Reviewed  COMPREHENSIVE METABOLIC PANEL WITH GFR - Abnormal; Notable for the following components:      Result Value   Calcium 8.5 (*)    Alkaline Phosphatase 141 (*)    All other  components within normal limits  CBC WITH DIFFERENTIAL/PLATELET - Abnormal; Notable for the following components:   WBC 3.3 (*)    All other components within normal limits  CBC WITH DIFFERENTIAL/PLATELET    EKG None  Radiology VAS US  LOWER EXTREMITY VENOUS (DVT) (ONLY [facility] & [facility]) Result Date: 02/16/2024  Lower Venous DVT Study Patient Name:  Jordan [name]  Date of Exam:   02/16/2024 Medical Rec #: [id number]      Accession #:    [id number] Date of Birth: 1966/01/23       Patient Gender: F Patient Age:   41 years Exam Location:  [facility] Procedure:      VAS US  LOWER EXTREMITY VENOUS (DVT) Referring Phys: Abdullahi Vallone --------------------------------------------------------------------------------  Indications: Pain, and Swelling.  Risk Factors: Sedentary lifestyle, obesity Fractured left foot, placed in cam boot on 02/13/2024. Limitations: Body habitus and poor ultrasound/tissue interface. Comparison Study: Previous exam on 04/26/2020 was negative for DVT Performing Technologist: Arlyce Berger RVT, RDMS  Examination Guidelines: A complete evaluation includes B-mode imaging, spectral Doppler, color Doppler, and power Doppler as needed of all accessible portions of each vessel. Bilateral testing is considered an integral part of a complete examination. Limited examinations for reoccurring indications may be performed as noted. The reflux portion of the exam is performed with the patient in reverse Trendelenburg.  +-----+---------------+---------+-----------+----------+--------------+ RIGHTCompressibilityPhasicitySpontaneityPropertiesThrombus Aging +-----+---------------+---------+-----------+----------+--------------+ CFV  Full           Yes      Yes                                 +-----+---------------+---------+-----------+----------+--------------+   +---------+---------------+---------+-----------+----------+-------------------+ LEFT      CompressibilityPhasicitySpontaneityPropertiesThrombus Aging      +---------+---------------+---------+-----------+----------+-------------------+ CFV      Full           Yes      Yes                                      +---------+---------------+---------+-----------+----------+-------------------+ SFJ      Full                                                             +---------+---------------+---------+-----------+----------+-------------------+ FV  Prox  Full           Yes      Yes                                      +---------+---------------+---------+-----------+----------+-------------------+ FV Mid                                                unable to visualize +---------+---------------+---------+-----------+----------+-------------------+ FV Distal                                             unable to visualize +---------+---------------+---------+-----------+----------+-------------------+ PFV      Full           Yes      Yes                                      +---------+---------------+---------+-----------+----------+-------------------+ POP      Full           Yes      Yes                  Not well visualized +---------+---------------+---------+-----------+----------+-------------------+ PTV                                                   unable to visualize +---------+---------------+---------+-----------+----------+-------------------+ PERO                                                  unable to visualize +---------+---------------+---------+-----------+----------+-------------------+   Left Technical Findings: Not visualized segments include FV (mid and distal), PTV, and peroneal veins. VERY difficult and limited exam. Extremely poor ultrasound/tissue interface and diffuse subcutaneous edema in addition to body habitus (BMI 50.62)   Summary: RIGHT: - No evidence of common femoral vein obstruction.   LEFT: - There is  no evidence of deep vein thrombosis in the lower extremity. However, portions of this examination were limited- see technologist comments above.  - No cystic structure found in the popliteal fossa. Diffuse subcutaneous edema of lower extremity.  *See table(s) above for measurements and observations.    Preliminary     Procedures Procedures  {Document cardiac monitor, telemetry assessment procedure when appropriate:1}  Medications Ordered in ED Medications  oxyCODONE -acetaminophen  (PERCOCET/ROXICET) 5-325 MG per tablet 2 tablet (has no administration in time range)    ED Course/ Medical Decision Making/ A&P   {   Click here for ABCD2, HEART and other calculatorsREFRESH Note before signing :1}                              Medical Decision Making Patient complains of swelling and pain to her left leg.  Amount and/or Complexity of Data Reviewed Labs: ordered.  Risk Prescription drug management.     {  Document critical care time when appropriate:1} {Document review of labs and clinical decision tools ie heart score, Chads2Vasc2 etc:1}  {Document your independent review of radiology images, and any outside records:1} {Document your discussion with family members, caretakers, and with consultants:1} {Document social determinants of health affecting pt's care:1} {Document your decision making why or why not admission, treatments were needed:1} Final Clinical Impression(s) / ED Diagnoses Final diagnoses:  None    Rx / DC Orders ED Discharge Orders     None

## 2024-02-16 NOTE — Progress Notes (Signed)
 LLE venous duplex has been completed.  Preliminary results given to [facility], PA-C.    Results can be found under chart review under CV PROC. 02/16/2024 3:43 PM Drey Shaff RVT, RDMS

## 2024-02-16 NOTE — ED Notes (Signed)
 Confirmed pt is on list for [facility] transport

## 2024-02-16 NOTE — Telephone Encounter (Signed)
 Left message for pt to call back; pt called, poor connection, as she's currently hospitalized at ED. Pt says she will call sometime tomorrow, 02/17/24, when she has a better connection to reschedule appointment. Pt is encouraged to take good care of herself and make sure her daughter knows what is going on with her health; pt says her daughter has been made aware.

## 2024-02-27 ENCOUNTER — Telehealth: Payer: Self-pay | Admitting: Clinical

## 2024-02-27 NOTE — Telephone Encounter (Signed)
 Attempt to call back pt, at pt request; Left HIPPA-compliant message to call back Carolyn Cisco from [employer] for [employer] at [facility] at  [phone] [facility] office).

## 2024-03-01 ENCOUNTER — Other Ambulatory Visit: Payer: Self-pay | Admitting: Radiology

## 2024-03-01 ENCOUNTER — Encounter: Payer: Self-pay | Admitting: General Practice

## 2024-03-01 ENCOUNTER — Encounter: Payer: Self-pay | Admitting: Hematology and Oncology

## 2024-03-01 MED ORDER — FOSFOMYCIN TROMETHAMINE 3 G PO PACK: 3.0000 g | PACK | Freq: Once | ORAL | 0 refills | Status: AC

## 2024-03-01 NOTE — Progress Notes (Signed)
 [facility] Spiritual Care Note  Received and returned voicemail from [place], leaving direct number and encouraging return call as needed/desired.   [address] Dorice [name], [place], [name] Pager [phone] Voicemail [phone]

## 2024-03-01 NOTE — Telephone Encounter (Signed)
 Prescription for Fosfomycin 3 g  packets sent to [facility] [place]. Patient continues to have UTI symptoms. Called and made facility aware.

## 2024-03-04 ENCOUNTER — Inpatient Hospital Stay: Payer: MEDICAID | Attending: Hematology and Oncology

## 2024-03-04 DIAGNOSIS — E538 Deficiency of other specified B group vitamins: Secondary | ICD-10-CM | POA: Insufficient documentation

## 2024-03-08 ENCOUNTER — Inpatient Hospital Stay: Payer: MEDICAID

## 2024-03-08 DIAGNOSIS — E538 Deficiency of other specified B group vitamins: Secondary | ICD-10-CM | POA: Diagnosis present

## 2024-03-08 MED ORDER — CYANOCOBALAMIN 1000 MCG/ML IJ SOLN
1000.0000 ug | Freq: Once | INTRAMUSCULAR | Status: AC
  Administered 2024-03-08: 1000 ug via INTRAMUSCULAR
  Filled 2024-03-08: qty 1

## 2024-03-09 ENCOUNTER — Encounter: Payer: Self-pay | Admitting: General Practice

## 2024-03-09 NOTE — Progress Notes (Signed)
 [facility] Spiritual Care Note  Received and returned voicemail from [place], encouraging return call as needed.   [address] Jordan [name], [place], [facility] Pager [phone] Voicemail [phone]

## 2024-03-11 ENCOUNTER — Telehealth: Payer: Self-pay

## 2024-03-11 NOTE — Telephone Encounter (Signed)
 LVM x2 for Jordan [name] to call office and schedule a follow up appointment in October to see Dr.Jackson-Moore

## 2024-03-15 ENCOUNTER — Encounter: Payer: Self-pay | Admitting: Hematology and Oncology

## 2024-03-16 ENCOUNTER — Emergency Department (HOSPITAL_COMMUNITY)
Admission: EM | Admit: 2024-03-16 | Discharge: 2024-03-16 | Disposition: A | Discharge status: Home / Self Care | Payer: MEDICAID | Attending: Emergency Medicine | Admitting: Emergency Medicine

## 2024-03-16 ENCOUNTER — Encounter: Payer: Self-pay | Admitting: Hematology and Oncology

## 2024-03-16 ENCOUNTER — Emergency Department (HOSPITAL_COMMUNITY): Payer: MEDICAID

## 2024-03-16 ENCOUNTER — Encounter (HOSPITAL_COMMUNITY): Payer: Self-pay

## 2024-03-16 ENCOUNTER — Other Ambulatory Visit: Payer: Self-pay

## 2024-03-16 DIAGNOSIS — R2242 Localized swelling, mass and lump, left lower limb: Secondary | ICD-10-CM | POA: Insufficient documentation

## 2024-03-16 DIAGNOSIS — R600 Localized edema: Secondary | ICD-10-CM

## 2024-03-16 LAB — CBC WITH DIFFERENTIAL/PLATELET
Abs Immature Granulocytes: 0.01 10*3/uL (ref 0.00–0.07)
Basophils Absolute: 0 10*3/uL (ref 0.0–0.1)
Basophils Relative: 0 %
Eosinophils Absolute: 0 10*3/uL (ref 0.0–0.5)
Eosinophils Relative: 0 %
HCT: 44.2 % (ref 36.0–46.0)
Hemoglobin: 13.9 g/dL (ref 12.0–15.0)
Immature Granulocytes: 0 %
Lymphocytes Relative: 19 %
Lymphs Abs: 0.8 10*3/uL (ref 0.7–4.0)
MCH: 30.5 pg (ref 26.0–34.0)
MCHC: 31.4 g/dL (ref 30.0–36.0)
MCV: 96.9 fL (ref 80.0–100.0)
Monocytes Absolute: 0.4 10*3/uL (ref 0.1–1.0)
Monocytes Relative: 9 %
Neutro Abs: 3 10*3/uL (ref 1.7–7.7)
Neutrophils Relative %: 72 %
Platelets: 173 10*3/uL (ref 150–400)
RBC: 4.56 MIL/uL (ref 3.87–5.11)
RDW: 13.7 % (ref 11.5–15.5)
WBC: 4.2 10*3/uL (ref 4.0–10.5)
nRBC: 0 % (ref 0.0–0.2)

## 2024-03-16 LAB — I-STAT CG4 LACTIC ACID, ED
Lactic Acid, Venous: 0.7 mmol/L (ref 0.5–1.9)
Lactic Acid, Venous: 0.5 mmol/L (ref 0.5–1.9)

## 2024-03-16 LAB — BASIC METABOLIC PANEL WITH GFR
Anion gap: 7 (ref 5–15)
BUN: 14 mg/dL (ref 6–20)
CO2: 28 mmol/L (ref 22–32)
Calcium: 9.1 mg/dL (ref 8.9–10.3)
Chloride: 104 mmol/L (ref 98–111)
Creatinine, Ser: 0.73 mg/dL (ref 0.44–1.00)
GFR, Estimated: >60 mL/min (ref >60)
Glucose, Bld: 92 mg/dL (ref 70–99)
Potassium: 4.4 mmol/L (ref 3.5–5.1)
Sodium: 139 mmol/L (ref 135–145)

## 2024-03-16 MED ORDER — OXYCODONE HCL 5 MG PO TABS
5.0000 mg | ORAL_TABLET | Freq: Once | ORAL | Status: AC
  Administered 2024-03-16: 5 mg via ORAL
  Filled 2024-03-16: qty 1

## 2024-03-16 MED ORDER — ACETAMINOPHEN 500 MG PO TABS
1000.0000 mg | ORAL_TABLET | Freq: Once | ORAL | Status: AC
  Administered 2024-03-16: 1000 mg via ORAL
  Filled 2024-03-16: qty 2

## 2024-03-16 MED ORDER — IOHEXOL 350 MG/ML SOLN
100.0000 mL | Freq: Once | INTRAVENOUS | Status: AC | PRN
  Administered 2024-03-16: 100 mL via INTRAVENOUS

## 2024-03-16 MED ORDER — SODIUM CHLORIDE (PF) 0.9 % IJ SOLN
INTRAMUSCULAR | Status: AC
  Filled 2024-03-16: qty 50

## 2024-03-16 NOTE — ED Triage Notes (Signed)
 [facility] reports pt coming from [facility] for bilateral leg pain and swelling. Decreased mobility due to the swelling and pain.

## 2024-03-16 NOTE — ED Notes (Signed)
 Jordan [name], [facility] @ [facility] made aware of pt. Discharge.

## 2024-03-16 NOTE — ED Notes (Signed)
 [facility] called for transport.

## 2024-03-16 NOTE — ED Provider Notes (Signed)
 [place] EMERGENCY DEPARTMENT AT [facility] Provider Note   CSN: [id number] Arrival date & time: 03/16/24  1053     History  Chief Complaint  Patient presents with   Leg Swelling    Jordan [name] is a 58 y.o. female.  58 yo F with a chief complaints left leg swelling.  She tells me this been going on for about a month.  States never really bothered her before.  She has trouble elevating it.  Tells me that makes it hurt worse.  She usually dangles it off of her bed.  She had been seen in the emergency department recently for this.  Had a negative workup and was told to do supportive measures.  She tried using compression stockings has tried elevation but due to discomfort has been unable.  She denies any difficulty breathing.  No fevers.  Tells me that it has been red for a while as well.        Home Medications Prior to Admission medications   Medication Sig Start Date End Date Taking? Authorizing Provider  acetaminophen  (TYLENOL ) 325 MG tablet Take 2 tablets (650 mg total) by mouth every 6 (six) hours as needed for mild pain (pain score 1-3), fever or headache. Patient taking differently: Take 650 mg by mouth every 6 (six) hours as needed for mild pain (pain score 1-3). 09/17/23  Yes McClung, Sarah A, PA-C  BAZA PROTECT MOISTURE BARRIER 12 % CREA Apply 1 application  topically See admin instructions. Apply to reddened, diaper area after each cleansing   Yes [provider]  desvenlafaxine  (PRISTIQ ) 50 MG 24 hr tablet Take 50 mg by mouth every morning. 07/07/23  Yes [provider]  divalproex  (DEPAKOTE ) 125 MG DR tablet Take 125 mg by mouth 3 (three) times daily.   Yes [provider]  gabapentin  (NEURONTIN ) 800 MG tablet Take 800 mg by mouth 3 (three) times daily. 01/11/22  Yes [provider]  loperamide  (IMODIUM ) 2 MG capsule Take 1 capsule (2 mg total) by mouth every 8 (eight) hours as needed for diarrhea or loose stools. Patient  taking differently: Take 2 mg by mouth every 8 (eight) hours as needed (after loose stools). 09/29/23  Yes Rai, Ripudeep K, MD  meclizine  (ANTIVERT ) 25 MG tablet Take 25 mg by mouth every 6 (six) hours as needed for dizziness.   Yes [provider]  Multiple Vitamin (TAB-A-VITE) TABS Take 1 tablet by mouth daily with breakfast.   Yes [provider]  nystatin  powder Apply 1 Application topically 2 (two) times daily as needed (for groin irritation).   Yes [provider]  oxyCODONE  (ROXICODONE ) 15 MG immediate release tablet Take 15 mg by mouth every 4 (four) hours as needed for pain.   Yes [provider]  QUEtiapine  (SEROQUEL ) 50 MG tablet Take 50 mg by mouth in the morning and at bedtime.   Yes [provider]  cephALEXin  (KEFLEX ) 500 MG capsule Take 1 capsule (500 mg total) by mouth 4 (four) times daily. Patient not taking: Reported on 03/16/2024 01/19/24   Mandy Second, PA-C  Multiple Vitamin (MULTIVITAMIN WITH MINERALS) TABS tablet Take 1 tablet by mouth daily. Patient not taking: Reported on 03/16/2024 09/30/23   Rai, Hurman Maiden, MD  polyethylene glycol (MIRALAX  / GLYCOLAX ) 17 g packet Take 17 g by mouth daily. Patient not taking: Reported on 03/16/2024 12/27/23   Etter Hermann., MD      Allergies    Ceftriaxone , Ciprofloxacin , Citalopram ,  Lamotrigine , Sulfa  antibiotics, and Tramadol    Review of Systems   Review of Systems  Physical Exam Updated Vital Signs BP (!) 140/67 (BP Location: Left Arm)   Pulse 97   Temp (!) 97.4 F (36.3 C) (Oral)   Resp 18   LMP  (LMP Unknown)   SpO2 93%  Physical Exam Vitals and nursing note reviewed.  Constitutional:      General: She is not in acute distress.    Appearance: She is well-developed. She is not diaphoretic.  HENT:     Head: Normocephalic and atraumatic.  Eyes:     Pupils: Pupils are equal, round, and reactive to light.  Cardiovascular:     Rate and Rhythm: Normal rate and regular  rhythm.     Heart sounds: No murmur heard.    No friction rub. No gallop.  Pulmonary:     Effort: Pulmonary effort is normal.     Breath sounds: No wheezing or rales.  Abdominal:     General: There is no distension.     Palpations: Abdomen is soft.     Tenderness: There is no abdominal tenderness.  Musculoskeletal:        General: No tenderness.     Cervical back: Normal range of motion and neck supple.  Skin:    General: Skin is warm and dry.     Comments: Edema erythema and warmth to the left lower extremity from the groin down.  Dopplerable dorsalis pedis pulse.  Motor and sensation intact as well.  Neurological:     Mental Status: She is alert and oriented to person, place, and time.  Psychiatric:        Behavior: Behavior normal.     ED Results / Procedures / Treatments   Labs (all labs ordered are listed, but only abnormal results are displayed) Labs Reviewed  CULTURE, BLOOD (ROUTINE X 2)  CULTURE, BLOOD (ROUTINE X 2)  CBC WITH DIFFERENTIAL/PLATELET  BASIC METABOLIC PANEL WITH GFR  I-STAT CG4 LACTIC ACID, ED  I-STAT CG4 LACTIC ACID, ED    EKG None  Radiology CT VENOGRAM ABD/PELVIS/LOWER EXT BILAT Result Date: 03/16/2024 CLINICAL DATA:  Bilateral lower extremity pain and swelling. Assess for DVT. EXAM: CT VENOGRAM ABDOMEN AND PELVIS AND LOWER EXTREMITY BILATERAL TECHNIQUE: Venographic phase images of the abdomen, pelvis and lower extremities were obtained following the administration of intravenous contrast. Multiplanar reformats and maximum intensity projections were generated. RADIATION DOSE REDUCTION: This exam was performed according to the departmental dose-optimization program which includes automated exposure control, adjustment of the mA and/or kV according to patient size and/or use of iterative reconstruction technique. CONTRAST:  OMNIPAQUE  IOHEXOL  350 MG/ML SOLN COMPARISON:  CT scan of the abdomen and pelvis 12/22/2023; CT scan of the chest 01/19/2024; CT  scan of the abdomen and pelvis 08/17/2022 FINDINGS: Lower chest: Approximately 0.7 cm pulmonary nodule in the inferior right lower lobe (image 14 series 14). This is unchanged compared to prior imaging from October of 2023 consistent with a benign process. Diffuse lower lobe bronchial wall thickening consistent with chronic bronchitis. Hepatobiliary: No focal liver abnormality is seen. Status post cholecystectomy. No biliary dilatation. Pancreas: Unremarkable. No pancreatic ductal dilatation or surrounding inflammatory changes. Spleen: Normal in size without focal abnormality. Adrenals/Urinary Tract: Circumscribed 3.9 cm water  attenuation simple cyst exophytic from the upper pole of the right kidney. No imaging follow-up is recommended. No hydronephrosis, nephrolithiasis or enhancing renal mass. The ureters and bladder are unremarkable. Stomach/Bowel: No focal bowel wall thickening or  evidence of obstruction. Vascular/Lymphatic: Trace atherosclerotic calcifications along the abdominal aorta. No evidence of aneurysm or dissection. No suspicious lymphadenopathy. Reproductive: Surgical changes of prior hysterectomy. Other: No abdominal wall hernia or abnormality. No abdominopelvic ascites. Musculoskeletal: No acute or significant osseous findings. Chronic compression deformity of the superior endplate of L2. 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 Extensive subcutaneous edema is present involving the left thigh and upper calf. There is associated skin thickening. IVC: No evidence for thrombus or stenosis. Portal and mesenteric veins: No evidence for thrombus or stenosis. Bilateral iliac veins: No evidence for thrombus or stenosis. Right lower extremity: No evidence for thrombus visualized. Left lower extremity: No evidence for thrombus visualized. IMPRESSION: 1. No evidence of iliocaval or lower extremity DVT. 2. No convincing evidence of iliac venous compression anatomy. 3. Nonspecific extensive reticulation of  the subcutaneous fat and thickening of the skin involving the left lower extremity from the upper thigh through the upper calf. Findings suggest either edema or cellulitis. 4. Atherosclerotic vascular calcifications in the abdominal aorta. Aortic Atherosclerosis (ICD10-I70.0). 5. Additional ancillary findings as above. Electronically Signed   By: Fernando Hoyer M.D.   On: 03/16/2024 15:12    Procedures Procedures    Medications Ordered in ED Medications  acetaminophen  (TYLENOL ) tablet 1,000 mg (has no administration in time range)  oxyCODONE  (Oxy IR/ROXICODONE ) immediate release tablet 5 mg (has no administration in time range)  iohexol  (OMNIPAQUE ) 350 MG/ML injection 100 mL (100 mLs Intravenous Contrast Given 03/16/24 1336)    ED Course/ Medical Decision Making/ A&P                                 Medical Decision Making Amount and/or Complexity of Data Reviewed Labs: ordered. Radiology: ordered.  Risk OTC drugs. Prescription drug management.   58 yo F with a chief complaint of left leg pain and swelling.  This been going on for about a month.  She has been seen in the emergency department for this previously.  Had a negative DVT study.  She has tried conservative measures but has had some difficulty performing them.  Clinically it looks like she has cellulitis though the timeline does not make sense.  It also would be unlikely that the patient would have such profound cellulitis and not have a fever.  I did consider deep venous obstruction.  Will obtain CT imaging.  CT imaging without obvious obstructive pathology.  No DVT.  Again I think unlikely to be cellulitis.  Will discharge home.  PCP follow-up.  3:17 PM:  I have discussed the diagnosis/risks/treatment options with the patient.  Evaluation and diagnostic testing in the emergency department does not suggest an emergent condition requiring admission or immediate intervention beyond what has been performed at this time.   They will follow up with PCP. We also discussed returning to the ED immediately if new or worsening sx occur. We discussed the sx which are most concerning (e.g., sudden worsening pain, fever, inability to tolerate by mouth) that necessitate immediate return. Medications administered to the patient during their visit and any new prescriptions provided to the patient are listed below.  Medications given during this visit Medications  acetaminophen  (TYLENOL ) tablet 1,000 mg (has no administration in time range)  oxyCODONE  (Oxy IR/ROXICODONE ) immediate release tablet 5 mg (has no administration in time range)  iohexol  (OMNIPAQUE ) 350 MG/ML injection 100 mL (100 mLs Intravenous Contrast Given 03/16/24 1336)     The  patient appears reasonably screen and/or stabilized for discharge and I doubt any other medical condition or other [facility] requiring further screening, evaluation, or treatment in the ED at this time prior to discharge.          Final Clinical Impression(s) / ED Diagnoses Final diagnoses:  Peripheral edema    Rx / DC Orders ED Discharge Orders     None         Albertus Hughs, DO 03/16/24 1517

## 2024-03-16 NOTE — Discharge Instructions (Signed)
 Try to elevated your leg whenever you are not up and moving around.  Compression stockings.  Follow up with your family doc in the office.

## 2024-03-17 LAB — BLOOD CULTURE ID PANEL (REFLEXED) - BCID2

## 2024-03-17 NOTE — ED Notes (Signed)
 Lab called 03/17/24 at 1:30pm. Stated 1 out of 4 bottles positive for staph species. Emergency Department Provider Debbra Fairy, PA notified. No new orders.

## 2024-03-18 NOTE — BH Specialist Note (Signed)
 Integrated Behavioral Health via Telemedicine Visit  03/22/2024 Jordan [name] [id number]  Number of Integrated Behavioral Health Clinician visits: Additional Visit  Session Start time: 1401   Session End time: 1433  Total time in minutes: 32   Referring Provider: Baby Bolt, MD Jordan/Family location: [facility] (skilled nursing facility) [facility] Provider location: [facility] [employer] [facility]  All persons participating in visit: Jordan [name] and [name] Orlena Garmon   Types of Service: Individual psychotherapy and Telephone visit  I connected with [name] and/or [name] via  Telephone or Video Enabled Telemedicine Application  (Video is Caregility application) and verified that I am speaking with the correct person using two identifiers. Discussed confidentiality: Yes   I discussed the limitations of telemedicine and the availability of in person appointments.  Discussed there is a possibility of technology failure and discussed alternative modes of communication if that failure occurs.  I discussed that engaging in this telemedicine visit, they consent to the provision of behavioral healthcare and the services will be billed under their insurance.  Jordan and/or legal guardian expressed understanding and consented to Telemedicine visit: Yes   Presenting Concerns: Jordan and/or family reports the following symptoms/concerns: Anxious regarding life changes the past few months (ongoing swollen leg, completing radiation treatments, move from her rental into assisted living, frustration at lack of cleanliness/order at current address and lack of activities). Pt is looking forward to moving to [place]  to be with her daughter, though uncertain how soon the move will take place; worry over daughter's current relationship. Pt is also anxious to establish care with a new gynecologist. Pt is currently coping  best with supportive people in life.  Duration of problem: Ongoing; Severity of problem: moderate  Jordan and/or Family's Strengths/Protective Factors: Social connections, Concrete supports in place (healthy food, safe environments, etc.), and Sense of purpose  Goals Addressed: Jordan will:  Reduce symptoms of: anxiety   Increase knowledge and/or ability of: stress reduction   Demonstrate ability to: Increase healthy adjustment to current life circumstances  Progress towards Goals: Ongoing  Interventions: Interventions utilized:  Motivational Interviewing and Supportive Reflection Standardized Assessments completed: Not Needed  Jordan and/or Family Response: Jordan agrees with treatment plan.   Assessment: Jordan currently experiencing Bipolar affective disorder; Psychosocial stress.   Jordan may benefit from continued therapeutic intervention  .  Plan: Follow up with behavioral health clinician on : Three weeks Behavioral recommendations:  -Continue taking medications as prescribed (including Seroquel  for [facility]) -Continue maintaining positive communication with daughter, best friend/previous landlord and current roommate -Consider attending current assisted living facility activities, as able -Consider following medical recommendations for ongoing health improvement Referral(s): Integrated [employer] (In Clinic)  I discussed the assessment and treatment plan with the Jordan and/or parent/guardian. They were provided an opportunity to ask questions and all were answered. They agreed with the plan and demonstrated an understanding of the instructions.   They were advised to call back or seek an in-person evaluation if the symptoms worsen or if the condition fails to improve as anticipated.  [name], LCSW     07/16/2023    5:18 PM 03/27/2023    4:22 PM 07/18/2022    1:43 PM 04/17/2022   11:19 AM 08/20/2021    2:13 PM  Depression screen PHQ 2/9   Decreased Interest 0 0 1 0 0  Down, Depressed, Hopeless 0 1 1 1  0  PHQ - 2 Score 0 1 2  1 0  Altered sleeping 0 2 1 1  0  Tired, decreased energy 0 1 1 0 0  Change in appetite 0 0 1 0 0  Feeling bad or failure about yourself  1 0 0 2 0  Trouble concentrating 1 1  3  0  Moving slowly or fidgety/restless 1 1 1  0 0  Suicidal thoughts 0 0 0 0 0  PHQ-9 Score 3 6 6 7  0      07/16/2023    5:18 PM 03/27/2023    4:25 PM 07/18/2022    1:43 PM 04/17/2022   11:19 AM  GAD 7 : Generalized Anxiety Score  Nervous, Anxious, on Edge 1 2 1 1   Control/stop worrying 1 1 1  0  Worry too much - different things 1 1 1  0  Trouble relaxing 1 1 1  0  Restless 1 1 1  0  Easily annoyed or irritable 2 1 1 1   Afraid - awful might happen 0 1 0 1  Total GAD 7 Score 7 8 6  3

## 2024-03-19 LAB — CULTURE, BLOOD (ROUTINE X 2)

## 2024-03-20 ENCOUNTER — Telehealth (HOSPITAL_BASED_OUTPATIENT_CLINIC_OR_DEPARTMENT_OTHER): Payer: Self-pay | Admitting: *Deleted

## 2024-03-20 NOTE — Telephone Encounter (Signed)
 Post ED Visit - Positive Culture Follow-up  Culture report reviewed by antimicrobial stewardship pharmacist: [facility] []  Court Distance, Pharm.D. []  Skeet Duke, Pharm.D., BCPS AQ-ID []  Leslee Rase, Pharm.D., BCPS []  Garland Junk, Pharm.D., BCPS []  [place], [address].D., BCPS, AAHIVP []  Alcide Aly, Pharm.D., BCPS, AAHIVP []  Jerri Morale, PharmD, BCPS []  Graham Laws, PharmD, BCPS []  Cleda Curly, PharmD, BCPS []  Tamar Fairly, PharmD []  Ballard Levels, PharmD, BCPS []  Ollen Beverage, PharmD  [facility] []  Arlyne Bering, PharmD []  Sherryle Don, PharmD []  Van Gelinas, PharmD []  Delila Felty, Rph []  [place]) Cleora Daft, PharmD []  Augustina Block, PharmD []  Arie Kurtz, PharmD []  Sharlyn Deaner, PharmD []  Agnes Hose, PharmD []  Kendall Pauls, PharmD []  Gladstone Lamer, PharmD []  Armanda Bern, PharmD [x]  Delpha Fickle, PharmD   Positive blood culture Contaminant and no further patient follow-up is required at this time.  Georgine Kitchens 03/20/2024, 8:34 AM

## 2024-03-21 LAB — CULTURE, BLOOD (ROUTINE X 2): Culture: NO GROWTH

## 2024-03-22 ENCOUNTER — Encounter: Payer: Self-pay | Admitting: Hematology and Oncology

## 2024-03-22 ENCOUNTER — Ambulatory Visit: Payer: MEDICAID | Admitting: Clinical

## 2024-03-22 DIAGNOSIS — F3160 Bipolar disorder, current episode mixed, unspecified: Secondary | ICD-10-CM

## 2024-03-22 NOTE — Patient Instructions (Signed)
 [facility] [address] [place], [place] [address] [phone] (main office) [phone] (Konner Saiz's office)

## 2024-03-24 ENCOUNTER — Telehealth: Payer: Self-pay

## 2024-03-24 NOTE — Telephone Encounter (Signed)
 Several attempts have been made by our office, leaving several voicemails, for Ms.Albanese to call office and schedule a follow up appointment with Dr.Jackson-Moore for October.

## 2024-04-13 NOTE — BH Specialist Note (Deleted)
 Integrated Behavioral Health via Telemedicine Visit  04/13/2024 [name] [id number]  Number of Integrated Behavioral Health Clinician visits: Additional Visit  Session Start time: 1401   Session End time: 1433  Total time in minutes: 32    Referring Provider: Glenys Birk, MD Patient/Family location: Home*** *** [facility] Provider location: [facility]  All persons participating in visit: Patient Jordan [name] and [facility] H[facility] [place] ***  Types of Service: Psychotherapy and Telephone visit***  I connected with [name] and/or Breyon Feinstein's n/a via  Telephone or Video Enabled Telemedicine Application  (Video is Caregility application) and verified that I am speaking with the correct person using two identifiers. Discussed confidentiality: Yes   I discussed the limitations of telemedicine and the availability of in person appointments.  Discussed there is a possibility of technology failure and discussed alternative modes of communication if that failure occurs.  I discussed that engaging in this telemedicine visit, they consent to the provision of behavioral healthcare and the services will be billed under their insurance.  Patient and/or legal guardian expressed understanding and consented to Telemedicine visit: Yes   Presenting Concerns: Patient and/or family reports the following symptoms/concerns: *** Duration of problem: ***; Severity of problem: {Mild/Moderate/Severe:20260}  Patient and/or Family's Strengths/Protective Factors: {CHL AMB BH PROTECTIVE [phone]}  Goals Addressed: Patient will:  Reduce symptoms of: {IBH Symptoms:21014056}   Increase knowledge and/or ability of: {IBH Patient Tools:21014057}   Demonstrate ability to: {IBH Goals:21014053}  Progress towards Goals: {CHL AMB BH PROGRESS TOWARDS GOALS:[phone]}    Interventions: Interventions utilized:  {IBH  Interventions:21014054} Standardized Assessments completed: {IBH Screening Tools:21014051}    Patient and/or Family Response: Patient agrees with treatment plan.   Clinical Assessment/Diagnosis  No diagnosis found. .   Patient may benefit from continued therapeutic intervention *** .  Plan: Follow up with behavioral health clinician on : *** Behavioral recommendations:  -*** -*** Referral(s): {IBH Referrals:21014055}  I discussed the assessment and treatment plan with the patient and/or parent/guardian. They were provided an opportunity to ask questions and all were answered. They agreed with the plan and demonstrated an understanding of the instructions.   They were advised to call back or seek an in-person evaluation if the symptoms worsen or if the condition fails to improve as anticipated.  Warren BROCKS Rielyn Krupinski, LCSW     07/16/2023    5:18 PM 03/27/2023    4:22 PM 07/18/2022    1:43 PM 04/17/2022   11:19 AM 08/20/2021    2:13 PM  Depression screen PHQ 2/9  Decreased Interest 0 0 1 0 0  Down, Depressed, Hopeless 0 1 1 1  0  PHQ - 2 Score 0 1 2 1  0  Altered sleeping 0 2 1 1  0  Tired, decreased energy 0 1 1 0 0  Change in appetite 0 0 1 0 0  Feeling bad or failure about yourself  1 0 0 2 0  Trouble concentrating 1 1  3  0  Moving slowly or fidgety/restless 1 1 1  0 0  Suicidal thoughts 0 0 0 0 0  PHQ-9 Score 3 6 6 7  0      07/16/2023    5:18 PM 03/27/2023    4:25 PM 07/18/2022    1:43 PM 04/17/2022   11:19 AM  GAD 7 : Generalized Anxiety Score  Nervous, Anxious, on Edge 1 2 1 1   Control/stop worrying 1 1 1  0  Worry too much - different things 1 1 1  0  Trouble relaxing 1 1 1  0  Restless 1 1 1  0  Easily annoyed or irritable 2 1 1 1   Afraid - awful might happen 0 1 0 1  Total GAD 7 Score 7 8 6  3

## 2024-04-14 ENCOUNTER — Encounter: Payer: MEDICAID | Admitting: Clinical

## 2024-04-19 ENCOUNTER — Ambulatory Visit: Payer: MEDICAID | Admitting: Obstetrics & Gynecology

## 2024-04-19 ENCOUNTER — Encounter: Payer: Self-pay | Admitting: Obstetrics & Gynecology

## 2024-04-19 VITALS — BP 117/65 | HR 89

## 2024-04-19 DIAGNOSIS — N951 Menopausal and female climacteric states: Secondary | ICD-10-CM

## 2024-04-19 DIAGNOSIS — Z79890 Hormone replacement therapy (postmenopausal): Secondary | ICD-10-CM

## 2024-04-19 MED ORDER — ESTRADIOL 0.075 MG/24HR TD PTTW
1.0000 | MEDICATED_PATCH | TRANSDERMAL | 12 refills | Status: AC
Start: 2024-04-19 — End: ?

## 2024-04-19 MED ORDER — ESTRADIOL 0.075 MG/24HR TD PTTW: 1.0000 | MEDICATED_PATCH | TRANSDERMAL | 12 refills | Status: DC

## 2024-04-19 NOTE — Progress Notes (Signed)
 GYNECOLOGY OFFICE VISIT NOTE  History:  Jordan [name] is a 58 y.o. G1P1001 with history of vaginal cancer, hysterectomy and other issues here for a refill of her Vivelle  Dot for menopausal symptoms.  Reports hot flashes and mood symptoms, usually ameliorated by her Vivelle  Dot.  Has been on this for years, no issues.  She denies any abnormal vaginal discharge, bleeding, pelvic pain or other concerns.  Past Medical History:  Diagnosis Date   Alcohol abuse    Anemia    patient denies   Bipolar 1 disorder (HCC)    No medications currently   CAP (community acquired pneumonia) 03/17/2015   History of radiation therapy 08/24/19-09/22/19   Vaginal brachytherapy   Dr. 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   Mental disorder    Obesity    PICC line infection 05/17/2015   Sepsis due to Gram negative bacteria (MDR E Coli) 02/18/2015   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HCC)    UTI (lower urinary tract infection)    Vaginal Pap smear, abnormal     Past Surgical History:  Procedure Laterality Date   ABDOMINAL HYSTERECTOMY     CERVICAL CONIZATION W/BX N/A 07/01/2016   Procedure: CONIZATION CERVIX WITH BIOPSY;  Surgeon: Ozell LITTIE Cowman, MD;  Location: [facility];  Service: Gynecology;  Laterality: N/A;   CHOLECYSTECTOMY     EXTERNAL FIXATION LEG Right 04/09/2020   Procedure: EXTERNAL FIXATION LEG;  Surgeon: Cristy Bonner DASEN, MD;  Location: [facility];  Service: Orthopedics;  Laterality: Right;   HYSTEROSCOPY WITH D & C N/A 01/25/2016   Procedure: DILATATION AND CURETTAGE /HYSTEROSCOPY;  Surgeon: Winton Felt, MD;  Location: [facility];  Service: Gynecology;  Laterality: N/A;   LYMPH NODE BIOPSY Bilateral 07/22/2019   Procedure: LYMPH NODE BIOPSY;  Surgeon: Eloy Herring, MD;  Location: [facility];  Service: Gynecology;  Laterality: Bilateral;   OPEN REDUCTION INTERNAL FIXATION (ORIF) FOOT LISFRANC FRACTURE Right 09/15/2023   Procedure: OPEN REDUCTION INTERNAL FIXATION (ORIF) FOOT  LISFRANC FRACTURE;  Surgeon: Kendal Franky SQUIBB, MD;  Location: [facility];  Service: Orthopedics;  Laterality: Right;   OPEN REDUCTION INTERNAL FIXATION (ORIF) TIBIA/FIBULA FRACTURE Right 04/10/2020   Procedure: OPEN REDUCTION INTERNAL FIXATION (ORIF) TIBIA/FIBULA FRACTURE;  Surgeon: Kendal Franky SQUIBB, MD;  Location: [facility];  Service: Orthopedics;  Laterality: Right;   ROBOT ASSISTED MYOMECTOMY N/A 07/22/2019   Procedure: XI ROBOTIC ASSISTED LAPAROSCOPIC RADICAL UPPER VAGINECTOMY, LEFT SALPINECTOMY, RIGHT SALPINGOOOPHERECTOMY;  Surgeon: Eloy Herring, MD;  Location: [facility];  Service: Gynecology;  Laterality: N/A;   VAGINAL HYSTERECTOMY N/A 03/11/2017   Procedure: HYSTERECTOMY VAGINAL WITH MORCELLATION;  Surgeon: Cowman Ozell LITTIE, MD;  Location: [facility];  Service: Gynecology;  Laterality: N/A;   The following portions of the patient's history were reviewed and updated as appropriate: allergies, current medications, past family history, past medical history, past social history, past surgical history and problem list.    Review of Systems:  Pertinent items noted in HPI and remainder of comprehensive ROS otherwise negative.  Physical Exam:  BP 117/65   Pulse 89   LMP  (LMP Unknown)  CONSTITUTIONAL: Well-developed, well-nourished female in no acute distress.  SKIN: No rash noted. Not diaphoretic. No erythema. No pallor. MUSCULOSKELETAL: Normal range of motion. No edema noted. NEUROLOGIC: Alert and oriented to person, place, and time. Normal muscle tone coordination. No cranial nerve deficit noted on observation. PSYCHIATRIC: Normal mood and affect. Normal behavior. Normal judgment and thought content. CARDIOVASCULAR: Normal heart rate noted  RESPIRATORY: Effort and breath sounds normal, no problems with respiration noted ABDOMEN: No masses or other overt distention noted on observation. No tenderness.   PELVIC: Deferred  Labs and Imaging No results found.  Assessment and Plan:     1. Menopausal syndrome  on hormone replacement therapy (Primary) Vivelle -Dot refilled. Told to follow up as needed and for her annual. - estradiol  (VIVELLE -DOT) 0.075 MG/24HR; Place 1 patch onto the skin 2 (two) times a week.  Dispense: 8 patch; Refill: 12 Routine preventative health maintenance measures emphasized. Please refer to After Visit Summary for other counseling recommendations.   Return for follow up as recommended.    I spent 25 minutes dedicated to the care of this patient including pre-visit review of records, face to face time with the patient discussing her conditions and treatments, post visit ordering of medications and appropriate tests or procedures, coordinating care and documenting this visit encounter.    GLORIS HUGGER, MD, FACOG Obstetrician & Gynecologist, [facility] for [employer], [facility]

## 2024-05-02 ENCOUNTER — Other Ambulatory Visit: Payer: Self-pay

## 2024-05-02 ENCOUNTER — Encounter (HOSPITAL_COMMUNITY): Payer: Self-pay | Admitting: Emergency Medicine

## 2024-05-02 ENCOUNTER — Emergency Department (HOSPITAL_COMMUNITY)
Admission: EM | Admit: 2024-05-02 | Discharge: 2024-05-02 | Disposition: A | Discharge status: Home / Self Care | Payer: MEDICAID | Attending: Emergency Medicine | Admitting: Emergency Medicine

## 2024-05-02 DIAGNOSIS — L03312 Cellulitis of back [any part except buttock]: Secondary | ICD-10-CM | POA: Insufficient documentation

## 2024-05-02 DIAGNOSIS — L918 Other hypertrophic disorders of the skin: Secondary | ICD-10-CM

## 2024-05-02 DIAGNOSIS — L02212 Cutaneous abscess of back [any part, except buttock]: Secondary | ICD-10-CM | POA: Insufficient documentation

## 2024-05-02 DIAGNOSIS — L0291 Cutaneous abscess, unspecified: Secondary | ICD-10-CM

## 2024-05-02 MED ORDER — GABAPENTIN 300 MG PO CAPS
300.0000 mg | ORAL_CAPSULE | Freq: Once | ORAL | Status: AC
  Administered 2024-05-02: 300 mg via ORAL
  Filled 2024-05-02: qty 1

## 2024-05-02 MED ORDER — LIDOCAINE HCL (PF) 1 % IJ SOLN
10.0000 mL | Freq: Once | INTRAMUSCULAR | Status: AC
  Administered 2024-05-02: 10 mL
  Filled 2024-05-02: qty 30

## 2024-05-02 MED ORDER — DOXYCYCLINE HYCLATE 100 MG PO CAPS: 100.0000 mg | ORAL_CAPSULE | Freq: Two times a day (BID) | ORAL | 0 refills | Status: DC

## 2024-05-02 NOTE — ED Notes (Addendum)
 Gm non emergency called for ptar to snf

## 2024-05-02 NOTE — ED Notes (Signed)
 I&D tray and lidocaine  abs.

## 2024-05-02 NOTE — Discharge Instructions (Addendum)
 Please use Tylenol or ibuprofen for pain.  You may use 600 mg ibuprofen every 6 hours or 1000 mg of Tylenol every 6 hours.  You may choose to alternate between the 2.  This would be most effective.  Not to exceed 4 g of Tylenol within 24 hours.  Not to exceed 3200 mg ibuprofen 24 hours.

## 2024-05-02 NOTE — ED Triage Notes (Signed)
 Pt bib g[facility]ms from [facility] [place], abscess to back and skin tag to back as well. Seen pcp last week was told it needed to be lanced  Pain has gotten worse today

## 2024-05-02 NOTE — ED Provider Notes (Signed)
 [place] EMERGENCY DEPARTMENT AT [facility] Provider Note   CSN: [id number] Arrival date & time: 05/02/24  1008     Patient presents with: Abscess   Jordan [name] is a 58 y.o. female with past medical history significant for bipolar disorder, morbid obesity, chronic pain, alcohol abuse, who presents from [facility] [place] with concern for an abscess on back and skin tag that is irritated on back.  Seen by PCP last week and told he needed to be lanced but that they would not do it today.  Reports pain is gotten worse today.    Abscess      Prior to Admission medications   Medication Sig Start Date End Date Taking? Authorizing Provider  doxycycline  (VIBRAMYCIN ) 100 MG capsule Take 1 capsule (100 mg total) by mouth 2 (two) times daily. 05/02/24  Yes Treana Lacour H, PA-C  acetaminophen  (TYLENOL ) 325 MG tablet Take 2 tablets (650 mg total) by mouth every 6 (six) hours as needed for mild pain (pain score 1-3), fever or headache. Patient taking differently: Take 650 mg by mouth every 6 (six) hours as needed for mild pain (pain score 1-3). 09/17/23   Danton Lauraine LABOR, PA-C  BAZA PROTECT MOISTURE BARRIER 12 % CREA Apply 1 application  topically See admin instructions. Apply to reddened, diaper area after each cleansing    [provider]  cephALEXin  (KEFLEX ) 500 MG capsule Take 1 capsule (500 mg total) by mouth 4 (four) times daily. Patient not taking: Reported on 04/19/2024 01/19/24   Odell Balls, PA-C  desvenlafaxine  (PRISTIQ ) 50 MG 24 hr tablet Take 50 mg by mouth every morning. 07/07/23   [provider]  divalproex  (DEPAKOTE ) 125 MG DR tablet Take 125 mg by mouth 3 (three) times daily.    [provider]  estradiol  (VIVELLE -DOT) 0.075 MG/24HR Place 1 patch onto the skin 2 (two) times a week. 04/19/24   Anyanwu, Ugonna A, MD  gabapentin  (NEURONTIN ) 800 MG tablet Take 800 mg by mouth 3 (three) times daily. 01/11/22   [provider]  loperamide  (IMODIUM ) 2 MG capsule Take 1 capsule (2 mg total) by mouth every 8 (eight) hours as needed for diarrhea or loose stools. 09/29/23   Rai, Nydia POUR, MD  meclizine  (ANTIVERT ) 25 MG tablet Take 25 mg by mouth every 6 (six) hours as needed for dizziness.    [provider]  Multiple Vitamin (MULTIVITAMIN WITH MINERALS) TABS tablet Take 1 tablet by mouth daily. Patient not taking: Reported on 04/19/2024 09/30/23   Davia Nydia POUR, MD  Multiple Vitamin (TAB-A-VITE) TABS Take 1 tablet by mouth daily with breakfast.    [provider]  nystatin  powder Apply 1 Application topically 2 (two) times daily as needed (for groin irritation).    [provider]  oxyCODONE  (ROXICODONE ) 15 MG immediate release tablet Take 15 mg by mouth every 4 (four) hours as needed for pain.    [provider]  polyethylene glycol (MIRALAX  / GLYCOLAX ) 17 g packet Take 17 g by mouth daily. Patient not taking: Reported on 04/19/2024 12/27/23   Perri LABOR Meliton Mickey., MD  QUEtiapine  (SEROQUEL ) 50 MG tablet Take 50 mg by mouth in the morning and at bedtime.    [provider]    Allergies: Ceftriaxone , Ciprofloxacin , Citalopram , Lamotrigine , Sulfa  antibiotics, and Tramadol    Review of Systems  All other systems reviewed and are negative.   Updated Vital Signs BP 130/78 (BP Location: Left Arm)   Pulse 82  Temp (!) 97.5 F (36.4 C) (Oral)   Resp 18   Ht 5' 8 (1.727 m)   Wt (!) 151 kg   LMP  (LMP Unknown)   SpO2 97%   BMI 50.62 kg/m   Physical Exam Vitals and nursing note reviewed.  Constitutional:      General: She is not in acute distress.    Appearance: Normal appearance.  HENT:     Head: Normocephalic and atraumatic.  Eyes:     General:        Right eye: No discharge.        Left eye: No discharge.  Cardiovascular:     Rate and Rhythm: Normal rate and regular rhythm.  Pulmonary:     Effort: Pulmonary effort is normal. No respiratory  distress.  Musculoskeletal:        General: No deformity.  Skin:    General: Skin is warm and dry.     Comments: 2x3 cm cellulitis with Abscess noted to right mid back, irritated skin tag on left with surrounding redness  Neurological:     Mental Status: She is alert and oriented to person, place, and time.  Psychiatric:        Mood and Affect: Mood normal.        Behavior: Behavior normal.     (all labs ordered are listed, but only abnormal results are displayed) Labs Reviewed - No data to display  EKG: None  Radiology: No results found.   .Incision and Drainage  Date/Time: 05/02/2024 11:55 AM  Performed by: Rosan Sherlean DEL, PA-C Authorized by: Rosan Sherlean DEL, PA-C   Consent:    Consent obtained:  Verbal   Consent given by:  Patient   Risks, benefits, and alternatives were discussed: yes     Risks discussed:  Bleeding, incomplete drainage, infection, pain and damage to other organs   Alternatives discussed:  No treatment Universal protocol:    Procedure explained and questions answered to patient or proxy's satisfaction: yes     Patient identity confirmed:  Verbally with patient Location:    Type:  Abscess   Size:  2x3   Location:  Trunk   Trunk location:  Back Pre-procedure details:    Skin preparation:  Povidone-iodine  Sedation:    Sedation type:  None Anesthesia:    Anesthesia method:  Local infiltration   Local anesthetic:  Lidocaine  1% w/o epi Procedure type:    Complexity:  Simple Procedure details:    Incision types:  Single straight   Incision depth:  Dermal   Wound management:  Probed and deloculated   Drainage:  Bloody and purulent   Drainage amount:  Moderate   Wound treatment:  Wound left open   Packing materials:  None Post-procedure details:    Procedure completion:  Tolerated    Medications Ordered in the ED  lidocaine  (PF) (XYLOCAINE ) 1 % injection 10 mL (10 mLs Infiltration Given by Other 05/02/24 1153)  gabapentin   (NEURONTIN ) capsule 300 mg (300 mg Oral Given 05/02/24 1157)                                    Medical Decision Making Risk Prescription drug management.   This patient is a 58 y.o. female who presents to the ED for concern of abscess to back, skin tag,.   Differential diagnoses prior to evaluation: Abscess, cellulitis vs other systemic infection  Past Medical History /  Social History / Additional history: Chart reviewed. Pertinent results include: bipolar disorder, morbid obesity, chronic pain, alcohol abuse,  Physical Exam: Physical exam performed. The pertinent findings include: 2x3 cm cellulitis with Abscess noted to right mid back, irritated skin tag on left with surrounding redness   Medications / Treatment: Incision and drainage performed as above, skin tag trimmed, bandage placed over both wounds.  Discharged with antibiotics.   Disposition: After consideration of the diagnostic results and the patients response to treatment, I feel that patient is stable for discharge with plan as above .   emergency department workup does not suggest an emergent condition requiring admission or immediate intervention beyond what has been performed at this time. The plan is: as above. The patient is safe for discharge and has been instructed to return immediately for worsening symptoms, change in symptoms or any other concerns.   Final diagnoses:  Abscess  Cellulitis of back except buttock  Inflamed skin tag    ED Discharge Orders          Ordered    doxycycline  (VIBRAMYCIN ) 100 MG capsule  2 times daily        05/02/24 1154               Annalyn Blecher, [place] H, PA-C 05/02/24 1158    Elnor Savant A, DO 05/07/24 1550

## 2024-05-10 ENCOUNTER — Ambulatory Visit (INDEPENDENT_AMBULATORY_CARE_PROVIDER_SITE_OTHER): Payer: MEDICAID | Admitting: Clinical

## 2024-05-10 DIAGNOSIS — F3160 Bipolar disorder, current episode mixed, unspecified: Secondary | ICD-10-CM | POA: Diagnosis not present

## 2024-05-10 DIAGNOSIS — Z658 Other specified problems related to psychosocial circumstances: Secondary | ICD-10-CM

## 2024-05-10 DIAGNOSIS — F4381 Prolonged grief disorder: Secondary | ICD-10-CM

## 2024-05-10 NOTE — Patient Instructions (Signed)
 [facility] [address] [place], [place] [address] [phone] (main office) [phone] (Konner Saiz's office)

## 2024-05-10 NOTE — BH Specialist Note (Unsigned)
 Integrated Behavioral Health Follow Up In-Person Visit  MRN: [id number] Name: Jordan [name]  Number of Integrated Behavioral Health Clinician visits: Additional Visit  Session Start time: 1401   Session End time: 1433  Total time in minutes: 32    Types of Service: {CHL AMB TYPE OF SERVICE:[phone]}  Interpretor:No. Interpretor Name and Language: n/a  Subjective: Jordan [name] is a 58 y.o. female accompanied by {Patient accompanied [phone]} Patient was referred by *** for ***. Patient reports the following symptoms/concerns: *** Duration of problem: ***; Severity of problem: {Mild/Moderate/Severe:20260}  Objective: Mood: {BHH MOOD:22306} and Affect: {BHH AFFECT:22307} Risk of harm to self or others: {CHL AMB BH Suicide Current Mental Status:21022748}  Life Context: Family and Social: *** School/Work: *** Self-Care: *** Life Changes: ***  Patient and/or Family's Strengths/Protective Factors: {CHL AMB BH PROTECTIVE [phone]}  Goals Addressed: Patient will:  Reduce symptoms of: {IBH Symptoms:21014056}   Increase knowledge and/or ability of: {IBH Patient Tools:21014057}   Demonstrate ability to: {IBH Goals:21014053}  Progress towards Goals: {CHL AMB BH PROGRESS TOWARDS GOALS:[phone]}  Interventions: Interventions utilized:  {IBH Interventions:21014054} Standardized Assessments completed: {IBH Screening Tools:21014051}      Patient and/or Family Response: Patient agrees with treatment plan.   Patient Centered Plan: Patient is on the following Treatment Plan(s): IBH  Clinical Assessment/Diagnosis  No diagnosis found. .   Patient may benefit from continued therapeutic intervention *** .  Plan: Follow up with behavioral health clinician on : *** Behavioral recommendations:  -*** -*** Referral(s): {IBH Referrals:21014055}  Warren BROCKS Crisol Muecke, LCSW     07/16/2023    5:18 PM 03/27/2023    4:22 PM 07/18/2022    1:43 PM 04/17/2022    11:19 AM 08/20/2021    2:13 PM  Depression screen PHQ 2/9  Decreased Interest 0 0 1 0 0  Down, Depressed, Hopeless 0 1 1 1  0  PHQ - 2 Score 0 1 2 1  0  Altered sleeping 0 2 1 1  0  Tired, decreased energy 0 1 1 0 0  Change in appetite 0 0 1 0 0  Feeling bad or failure about yourself  1 0 0 2 0  Trouble concentrating 1 1  3  0  Moving slowly or fidgety/restless 1 1 1  0 0  Suicidal thoughts 0 0 0 0 0  PHQ-9 Score 3 6 6 7  0      07/16/2023    5:18 PM 03/27/2023    4:25 PM 07/18/2022    1:43 PM 04/17/2022   11:19 AM  GAD 7 : Generalized Anxiety Score  Nervous, Anxious, on Edge 1 2 1 1   Control/stop worrying 1 1 1  0  Worry too much - different things 1 1 1  0  Trouble relaxing 1 1 1  0  Restless 1 1 1  0  Easily annoyed or irritable 2 1 1 1   Afraid - awful might happen 0 1 0 1  Total GAD 7 Score 7 8 6  3

## 2024-05-19 ENCOUNTER — Telehealth: Payer: Self-pay | Admitting: Family Medicine

## 2024-05-19 NOTE — Telephone Encounter (Signed)
 Received a call from Jordan [name] requesting her appointment information. When I gave it to her, she put someone from the facility on the phone. The lady I spoke to requested if we could speak to them before scheduling because they need to set up the transportation.

## 2024-05-21 NOTE — BH Specialist Note (Signed)
 Integrated Behavioral Health Follow Up In-Person Visit  MRN: [id number] Name: Jordan [name]  Number of Integrated Behavioral Health Clinician visits: 2- Second Visit  Session Start time: 1415   Session End time: 1513  Total time in minutes: 58  Types of Service: Individual psychotherapy  Interpretor:No. Interpretor Name and Language: n/a  Subjective: Jordan [name] is a 58 y.o. female accompanied by n/a Patient was referred by Gloris Hugger, MD for emotional distress. Patient reports the following symptoms/concerns: Mourning loss of beloved partner; processing thoughts and emotions regarding his recent birthday, as well as her own upcoming birthday tomorrow (facing aging) that will be spent in a place she dislikes; holding onto hope that her daughter will move her back home to [place]  before her next birthday.  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 School/Work: Disability Self-Care: Continued self-advocacy Life Changes: New roommate at assisted living facility  Patient and/or Family's Strengths/Protective Factors: Social connections, Concrete supports in place (healthy food, safe environments, etc.), and Sense of purpose  Goals Addressed: Patient will:  Reduce symptoms of: depression, mood instability, and stress   Increase knowledge and/or ability of: self-management skills and stress reduction   Demonstrate ability to: Increase healthy adjustment to current life circumstances and Increase motivation to adhere to plan of care  Progress towards Goals: Ongoing  Interventions: Interventions utilized:  Motivational Interviewing and Supportive Reflection Standardized Assessments completed: Not  Needed      Patient and/or Family Response: Patient agrees with treatment plan.   Patient Centered Plan: Patient is on the following Treatment Plan(s): IBH  Clinical Assessment/Diagnosis  Prolonged grief disorder  Bipolar affective disorder, current episode mixed, current episode severity unspecified (HCC)  Psychosocial stressors .   Patient may benefit from continued therapeutic intervention  .  Plan: Follow up with behavioral health clinician on : Two weeks Behavioral recommendations:  -Maintain hope; maintain daily communication with supportive people in life (daughter; friends) -Continue participating in activities planned at assisted living facility (Bingo, etc.); continue efforts to keep voice down; continue to consider headphones while watching shows as needed Referral(s): [facility] [employer] (In Clinic)  Warren BROCKS [place], [place]     07/16/2023    5:18 PM 03/27/2023    4:22 PM 07/18/2022    1:43 PM 04/17/2022   11:19 AM 08/20/2021    2:13 PM  Depression screen PHQ 2/9  Decreased Interest 0 0 1 0 0  Down, Depressed, Hopeless 0 1 1 1  0  PHQ - 2 Score 0 1 2 1  0  Altered sleeping 0 2 1 1  0  Tired, decreased energy 0 1 1 0 0  Change in appetite 0 0 1 0 0  Feeling bad or failure about yourself  1 0 0 2 0  Trouble concentrating 1 1  3  0  Moving slowly or fidgety/restless 1 1 1  0 0  Suicidal thoughts 0 0 0 0 0  PHQ-9 Score 3 6 6 7  0      07/16/2023    5:18 PM 03/27/2023    4:25 PM 07/18/2022    1:43 PM 04/17/2022   11:19 AM  GAD 7 : Generalized Anxiety Score  Nervous, Anxious, on Edge 1 2 1 1   Control/stop worrying 1 1 1  0  Worry too  much - different things 1 1 1  0  Trouble relaxing 1 1 1  0  Restless 1 1 1  0  Easily annoyed or irritable 2 1 1 1   Afraid - awful might happen 0 1 0 1  Total GAD 7 Score 7 8 6  3

## 2024-05-24 ENCOUNTER — Ambulatory Visit: Payer: MEDICAID | Admitting: Clinical

## 2024-05-24 DIAGNOSIS — F4381 Prolonged grief disorder: Secondary | ICD-10-CM

## 2024-05-24 DIAGNOSIS — F3160 Bipolar disorder, current episode mixed, unspecified: Secondary | ICD-10-CM

## 2024-05-24 DIAGNOSIS — Z658 Other specified problems related to psychosocial circumstances: Secondary | ICD-10-CM

## 2024-05-25 ENCOUNTER — Encounter: Payer: Self-pay | Admitting: Hematology and Oncology

## 2024-06-04 ENCOUNTER — Inpatient Hospital Stay: Payer: MEDICAID | Attending: Hematology and Oncology

## 2024-06-04 ENCOUNTER — Telehealth: Payer: Self-pay

## 2024-06-04 DIAGNOSIS — E538 Deficiency of other specified B group vitamins: Secondary | ICD-10-CM | POA: Insufficient documentation

## 2024-06-04 MED ORDER — CYANOCOBALAMIN 1000 MCG/ML IJ SOLN
1000.0000 ug | Freq: Once | INTRAMUSCULAR | Status: AC
  Administered 2024-06-04: 1000 ug via INTRAMUSCULAR
  Filled 2024-06-04: qty 1

## 2024-06-04 NOTE — Telephone Encounter (Signed)
 Patient called with questions about upcoming appointment. LVM with today's appointment date and time on patient's primary number.

## 2024-06-08 ENCOUNTER — Ambulatory Visit: Payer: MEDICAID | Admitting: Clinical

## 2024-06-08 DIAGNOSIS — F3160 Bipolar disorder, current episode mixed, unspecified: Secondary | ICD-10-CM

## 2024-06-08 DIAGNOSIS — Z658 Other specified problems related to psychosocial circumstances: Secondary | ICD-10-CM

## 2024-06-08 DIAGNOSIS — F4381 Prolonged grief disorder: Secondary | ICD-10-CM
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
## 2024-06-09 ENCOUNTER — Ambulatory Visit: Payer: MEDICAID | Admitting: Family Medicine

## 2024-06-09 VITALS — BP 109/71 | HR 80

## 2024-06-09 DIAGNOSIS — Z0000 Encounter for general adult medical examination without abnormal findings: Secondary | ICD-10-CM | POA: Diagnosis not present

## 2024-06-09 DIAGNOSIS — R300 Dysuria: Secondary | ICD-10-CM

## 2024-06-09 LAB — POCT URINALYSIS DIP (DEVICE)
Glucose, UA: NEGATIVE mg/dL
Ketones, ur: NEGATIVE mg/dL
Nitrite: POSITIVE — AB
Protein, ur: 100 mg/dL — AB
Specific Gravity, Urine: 1.025 (ref 1.005–1.030)
Urobilinogen, UA: >=8 mg/dL (ref 0.0–1.0)
pH: 7 (ref 5.0–8.0)

## 2024-06-09 MED ORDER — NITROFURANTOIN MONOHYD MACRO 100 MG PO CAPS: 100.0000 mg | ORAL_CAPSULE | Freq: Two times a day (BID) | ORAL | 0 refills | Status: DC

## 2024-06-10 ENCOUNTER — Telehealth: Payer: Self-pay | Admitting: Obstetrics & Gynecology

## 2024-06-10 ENCOUNTER — Other Ambulatory Visit: Payer: Self-pay | Admitting: Obstetrics & Gynecology

## 2024-06-10 DIAGNOSIS — R300 Dysuria: Secondary | ICD-10-CM

## 2024-06-10 LAB — URINALYSIS
Bilirubin, UA: NEGATIVE
Glucose, UA: NEGATIVE
Ketones, UA: NEGATIVE
Nitrite, UA: POSITIVE — AB
Specific Gravity, UA: 1.026 (ref 1.005–1.030)
Urobilinogen, Ur: 1 mg/dL (ref 0.2–1.0)
pH, UA: 8.5 — ABNORMAL HIGH (ref 5.0–7.5)

## 2024-06-10 MED ORDER — NITROFURANTOIN MONOHYD MACRO 100 MG PO CAPS: 100.0000 mg | ORAL_CAPSULE | Freq: Two times a day (BID) | ORAL | 0 refills | Status: DC

## 2024-06-10 NOTE — Addendum Note (Signed)
 Addended by: JOSHUA METH C on: 06/10/2024 10:23 AM   Modules accepted: Orders

## 2024-06-10 NOTE — Telephone Encounter (Signed)
 Answering service message regarding antibiotic Upon review of her chart it does look like antibiotic was ordered, but pharmacy was not listed [facility] was contacted and prescription sent in Detailed voicemail left  Kahmari Herard, DO Attending Obstetrician & Gynecologist, [facility] for [employer], [facility]

## 2024-06-10 NOTE — Progress Notes (Signed)
 Upon review of her chart- no pharmacy was listed- added and sent in  Ellieana Dolecki, DO Attending Obstetrician & Gynecologist, [facility] for [employer], [facility]

## 2024-06-10 NOTE — Progress Notes (Signed)
 ANNUAL EXAM Patient name: Jordan [name] MRN [id number]  Date of birth: March 23, 1966 Chief Complaint:   Gynecologic Exam  History of Present Illness:   Jordan [name] is a 58 y.o. G52P1001 Caucasian female being seen today for a routine annual exam.  Current complaints: Complaining of UTI symptoms.  Reports she has had UTI symptoms for years but has not had treatment yet.  Burning with urination.  No LMP recorded (lmp unknown). Patient has had a hysterectomy.   The pregnancy intention screening data noted above was reviewed. Potential methods of contraception were discussed. The patient elected to proceed with No data recorded.   Last pap 07/30/2023. Results were: NILM w/ HRHPV negative. H/O abnormal pap: yes ASCUS on 04/03/2022 Last mammogram: 09/17/2022. Results were: normal. Family h/o breast cancer: no Last colonoscopy: Never done. Results were: N/A. Family h/o colorectal cancer: no     07/16/2023    5:18 PM 03/27/2023    4:22 PM 07/18/2022    1:43 PM 04/17/2022   11:19 AM 08/20/2021    2:13 PM  Depression screen PHQ 2/9  Decreased Interest 0 0 1 0 0  Down, Depressed, Hopeless 0 1 1 1  0  PHQ - 2 Score 0 1 2 1  0  Altered sleeping 0 2 1 1  0  Tired, decreased energy 0 1 1 0 0  Change in appetite 0 0 1 0 0  Feeling bad or failure about yourself  1 0 0 2 0  Trouble concentrating 1 1  3  0  Moving slowly or fidgety/restless 1 1 1  0 0  Suicidal thoughts 0 0 0 0 0  PHQ-9 Score 3 6 6 7  0        07/16/2023    5:18 PM 03/27/2023    4:25 PM 07/18/2022    1:43 PM 04/17/2022   11:19 AM  GAD 7 : Generalized Anxiety Score  Nervous, Anxious, on Edge 1 2 1 1   Control/stop worrying 1 1 1  0  Worry too much - different things 1 1 1  0  Trouble relaxing 1 1 1  0  Restless 1 1 1  0  Easily annoyed or irritable 2 1 1 1   Afraid - awful might happen 0 1 0 1  Total GAD 7 Score 7 8 6 3      Review of Systems:   Pertinent items are noted in HPI Denies any headaches, blurred vision, fatigue, shortness of  breath, chest pain, abdominal pain, abnormal vaginal discharge/itching/odor/irritation, problems with periods, bowel movements, urination, or intercourse unless otherwise stated above. Pertinent History Reviewed:  Reviewed past medical,surgical, social and family history.  Reviewed problem list, medications and allergies. Physical Assessment:   Vitals:   06/09/24 1331  BP: 109/71  Pulse: 80  There is no height or weight on file to calculate BMI.        Physical Examination:   General appearance - well appearing, and in no distress  Mental status - alert, oriented to person, place, and time, tangential  Psych:  She has a normal mood, flat affect  Skin - warm and dry, normal color, no suspicious lesions noted  Chest - effort normal, all lung fields clear to auscultation bilaterally  Heart - normal rate and regular rhythm  Neck:  midline trachea, no thyromegaly or nodules  Abdomen - soft, nontender, nondistended, no masses or organomegaly  Extremities:  No swelling or varicosities noted  Chaperone present for exam  Results for orders placed or performed in visit on 06/09/24 (from the past  24 hours)  POCT urinalysis dip (device)   Collection Time: 06/09/24  2:03 PM  Result Value Ref Range   Glucose, UA NEGATIVE NEGATIVE mg/dL   Bilirubin Urine SMALL (A) NEGATIVE   Ketones, ur NEGATIVE NEGATIVE mg/dL   Specific Gravity, Urine 1.025 1.005 - 1.030   Hgb urine dipstick MODERATE (A) NEGATIVE   pH 7.0 5.0 - 8.0   Protein, ur 100 (A) NEGATIVE mg/dL   Urobilinogen, UA >=1.9 0.0 - 1.0 mg/dL   Nitrite POSITIVE (A) NEGATIVE   Leukocytes,Ua LARGE (A) NEGATIVE  Urinalysis   Collection Time: 06/09/24  5:01 PM  Result Value Ref Range   Specific Gravity, UA 1.026 1.005 - 1.030   pH, UA 8.5 (H) 5.0 - 7.5   Color, UA Yellow Yellow   Appearance Ur Turbid (A) Clear   Leukocytes,UA 3+ (A) Negative   Protein,UA 2+ (A) Negative/Trace   Glucose, UA Negative Negative   Ketones, UA Negative  Negative   RBC, UA 1+ (A) Negative   Bilirubin, UA Negative Negative   Urobilinogen, Ur 1.0 0.2 - 1.0 mg/dL   Nitrite, UA Positive (A) Negative   *Note: Due to a large number of results and/or encounters for the requested time period, some results have not been displayed. A complete set of results can be found in Results Review.    Assessment & Plan:  1) Well-Woman Exam  2) burning with urination-patient with chronic UTI symptoms.  Reports she was seen by urology and told there was nothing they could do but feels like she may have a UTI now and would like to be evaluated for this.  Urinalysis and urine culture sent.  Urinalysis consistent with UTI with positive nitrates.  Will send prescription for Macrobid  to her pharmacy.  Pending results of culture may need to change treatment.  Will treat as needed.  Labs/procedures today: Urinalysis, urine culture  Mammogram: schedule screening mammo as soon as possible, or sooner if problems Colonoscopy: Recommended colonoscopy.  Offered to place referral for colonoscopy but patient reported she did not want to do one and would not do one.  Orders Placed This Encounter  Procedures   Urine Culture   MM 3D SCREENING MAMMOGRAM BILATERAL BREAST   Urinalysis   POCT urinalysis dip (device)    Meds:  Meds ordered this encounter  Medications   nitrofurantoin , macrocrystal-monohydrate, (MACROBID ) 100 MG capsule    Sig: Take 1 capsule (100 mg total) by mouth 2 (two) times daily.    Dispense:  14 capsule    Refill:  0    Follow-up: No follow-ups on file.  Travaris Kosh V Elver Stadler, MD 06/10/2024 9:39 AM

## 2024-06-12 LAB — URINE CULTURE

## 2024-06-15 ENCOUNTER — Encounter: Payer: Self-pay | Admitting: General Practice

## 2024-06-15 NOTE — Progress Notes (Signed)
[place] Spiritual Care Note  Received and returned voicemail from [employer], encouraging return call.   [place], [place], [facility] Pager [phone] Voicemail [phone]

## 2024-06-22 NOTE — BH Specialist Note (Unsigned)
 Integrated Behavioral Health Follow Up In-Person Visit  MRN: [id number] Name: Jordan [name]  Number of Integrated Behavioral Health Clinician visits: 4- Fourth Visit  Session Start time: 1415   Session End time: 1514  Total time in minutes: 59    Types of Service: Individual psychotherapy  Interpretor:No. Interpretor Name and Language: n/a  Subjective: Jordan [name] is a 58 y.o. female accompanied by n/a Patient was referred by Gloris Hugger, MD for emotional distress. Patient reports the following symptoms/concerns: Processing decision to move to [place]  to be with her daughter; impatience and uncertainty at having to wait an indefinite amount of time before moving.  Duration of problem: Ongoing; Severity of problem: moderate  Objective: Mood: Anxious and Affect: Appropriate Risk of harm to self or others: No plan to harm self or others  Life Context: Family and Social: Assisted living facility; new roommate within past two weeks; daughter and extended family in [place] ; best friend local School/Work: disability Self-Care: Continued self-advocacy Life Changes: Adjusting to another new roommate  Patient and/or Family's Strengths/Protective Factors: Social connections, Concrete supports in place (healthy food, safe environments, etc.), and Sense of purpose  Goals Addressed: Patient will:  Reduce symptoms of: agitation, anxiety, depression, mood instability, and stress   Increase knowledge and/or ability of: stress reduction   Demonstrate ability to: Increase healthy adjustment to current life circumstances and Increase motivation to adhere to plan of care  Progress towards Goals: Ongoing  Interventions: Interventions utilized:  Motivational Interviewing and Supportive Reflection Standardized Assessments completed: Not Needed      Patient and/or Family Response: Patient agrees with treatment plan.   Patient Centered Plan: Patient is on the following Treatment  Plan(s): IBH  Clinical Assessment/Diagnosis  Bipolar affective disorder, current episode mixed, current episode severity unspecified (HCC)  Prolonged grief disorder  Psychosocial stressors .   Patient may benefit from continued therapeutic intervention  .  Plan: Follow up with behavioral health clinician on : Two weeks Behavioral recommendations:  -Continue maintaining hopeful outlook and continued communication with daughter and other supportive people in life -Consider resuming onsite activities in residence, after they begin again -Continue deep breathing exercises for emotional regulation and watching shows on phone for distraction from worry Referral(s): Integrated [employer] [facility] (In Clinic)  Warren BROCKS [place], [place]     07/16/2023    5:18 PM 03/27/2023    4:22 PM 07/18/2022    1:43 PM 04/17/2022   11:19 AM 08/20/2021    2:13 PM  Depression screen PHQ 2/9  Decreased Interest 0 0 1 0 0  Down, Depressed, Hopeless 0 1 1 1  0  PHQ - 2 Score 0 1 2 1  0  Altered sleeping 0 2 1 1  0  Tired, decreased energy 0 1 1 0 0  Change in appetite 0 0 1 0 0  Feeling bad or failure about yourself  1 0 0 2 0  Trouble concentrating 1 1  3  0  Moving slowly or fidgety/restless 1 1 1  0 0  Suicidal thoughts 0 0 0 0 0  PHQ-9 Score 3 6 6 7  0      07/16/2023    5:18 PM 03/27/2023    4:25 PM 07/18/2022    1:43 PM 04/17/2022   11:19 AM  GAD 7 : Generalized Anxiety Score  Nervous, Anxious, on Edge 1 2 1 1   Control/stop worrying 1 1 1  0  Worry too much - different things 1 1 1  0  Trouble relaxing 1 1 1  0  Restless  1 1 1  0  Easily annoyed or irritable 2 1 1 1   Afraid - awful might happen 0 1 0 1  Total GAD 7 Score 7 8 6  3

## 2024-06-23 ENCOUNTER — Ambulatory Visit (INDEPENDENT_AMBULATORY_CARE_PROVIDER_SITE_OTHER): Payer: MEDICAID | Admitting: Clinical

## 2024-06-23 DIAGNOSIS — Z658 Other specified problems related to psychosocial circumstances: Secondary | ICD-10-CM

## 2024-06-23 DIAGNOSIS — F3160 Bipolar disorder, current episode mixed, unspecified: Secondary | ICD-10-CM | POA: Diagnosis not present

## 2024-06-23 DIAGNOSIS — F4381 Prolonged grief disorder: Secondary | ICD-10-CM

## 2024-06-28 ENCOUNTER — Ambulatory Visit
Admission: RE | Admit: 2024-06-28 | Discharge: 2024-06-28 | Disposition: A | Discharge status: Home / Self Care | Payer: MEDICAID | Source: Ambulatory Visit | Attending: Family Medicine

## 2024-06-28 DIAGNOSIS — Z0000 Encounter for general adult medical examination without abnormal findings: Secondary | ICD-10-CM

## 2024-06-29 ENCOUNTER — Telehealth: Payer: Self-pay

## 2024-06-29 ENCOUNTER — Other Ambulatory Visit: Payer: Self-pay

## 2024-06-29 DIAGNOSIS — R300 Dysuria: Secondary | ICD-10-CM

## 2024-06-29 MED ORDER — NITROFURANTOIN MONOHYD MACRO 100 MG PO CAPS: 100.0000 mg | ORAL_CAPSULE | Freq: Two times a day (BID) | ORAL | 0 refills | Status: DC

## 2024-06-29 NOTE — Telephone Encounter (Signed)
 [place], called on behalf of Jordan [name], stating that the pt never received her Rx for her UTI.  Per chart review, the Macrobid  Rx printed and did not electronically send to pharmacy.  Medication e-prescribed to pt's desired pharmacy.    Analuisa Tudor,RN  06/29/24

## 2024-07-02 ENCOUNTER — Other Ambulatory Visit: Payer: Self-pay | Admitting: Family Medicine

## 2024-07-02 DIAGNOSIS — R928 Other abnormal and inconclusive findings on diagnostic imaging of breast: Secondary | ICD-10-CM

## 2024-07-02 NOTE — BH Specialist Note (Signed)
 Integrated Behavioral Health Follow Up In-Person Visit  MRN: [id number] Name: Jordan [name]  Number of Integrated Behavioral Health Clinician visits: 5-Fifth Visit  Session Start time: 1515   Session End time: 1555  Total time in minutes: 40    Types of Service: Individual psychotherapy  Interpretor:No. Interpretor Name and Language: n/a  Subjective: Jordan [name] is a 58 y.o. female accompanied by n/a Patient was referred by Gloris Hugger, MD for emotional distress. Patient reports the following symptoms/concerns: Anxiety regarding upcoming mammogram; fear of possible breast cancer and losing breasts; fear of death and worry about forgetting what her deceased partner looks like when she meets him in the afterlife; mourning life without him. Pt doesn't have a copy of his obituary and can't remember his exact death date.  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  Life Changes: Loss of partner 2 years 4 months ago (02/20/2022); move to assisted living facility  Patient and/or Family's Strengths/Protective Factors: Social connections, Concrete supports in place (healthy food, safe environments, etc.), and Sense of purpose  Goals Addressed: Patient will:  Reduce symptoms of: anxiety, depression, and mood instability   Increase knowledge and/or ability of: coping skills   Demonstrate ability to: Increase healthy adjustment to current life circumstances and Increase motivation to adhere to plan of care  Progress towards Goals: Ongoing  Interventions: Interventions utilized:  Motivational Interviewing and Supportive Reflection Standardized Assessments completed: Not Needed  Patient and/or Family  Response: Patient agrees with treatment plan.   Patient Centered Plan: Patient is on the following Treatment Plan(s): IBH  Clinical Assessment/Diagnosis  Bipolar affective disorder, current episode mixed, current episode severity unspecified (HCC)  Prolonged grief disorder  Psychosocial stressors.   Patient may benefit from continued therapeutic intervention.  Plan: Follow up with behavioral health clinician on : Two weeks Behavioral recommendations:  -Continue taking BH medications as prescribed -Continue maintaining communication daily with supportive people in life -Take home printed copies of partner's obituary today -Continue daily breathing exercises as needed daily; watching shows on phone for distraction and entertainment Referral(s): Integrated [employer] (In Clinic)  [place], LCSW

## 2024-07-07 ENCOUNTER — Ambulatory Visit: Payer: MEDICAID | Admitting: Clinical

## 2024-07-07 DIAGNOSIS — F3160 Bipolar disorder, current episode mixed, unspecified: Secondary | ICD-10-CM | POA: Diagnosis not present

## 2024-07-07 DIAGNOSIS — F4381 Prolonged grief disorder: Secondary | ICD-10-CM

## 2024-07-07 DIAGNOSIS — Z658 Other specified problems related to psychosocial circumstances: Secondary | ICD-10-CM

## 2024-07-08 ENCOUNTER — Other Ambulatory Visit: Payer: Self-pay | Admitting: Family Medicine

## 2024-07-08 ENCOUNTER — Ambulatory Visit
Admission: RE | Admit: 2024-07-08 | Discharge: 2024-07-08 | Disposition: A | Discharge status: Home / Self Care | Payer: MEDICAID | Source: Ambulatory Visit | Attending: Family Medicine

## 2024-07-08 ENCOUNTER — Ambulatory Visit
Admission: RE | Admit: 2024-07-08 | Discharge: 2024-07-08 | Disposition: A | Discharge status: Home / Self Care | Payer: MEDICAID | Source: Ambulatory Visit | Attending: Family Medicine | Admitting: Family Medicine

## 2024-07-08 DIAGNOSIS — R928 Other abnormal and inconclusive findings on diagnostic imaging of breast: Secondary | ICD-10-CM

## 2024-07-08 DIAGNOSIS — N641 Fat necrosis of breast: Secondary | ICD-10-CM

## 2024-07-26 ENCOUNTER — Ambulatory Visit (INDEPENDENT_AMBULATORY_CARE_PROVIDER_SITE_OTHER): Payer: MEDICAID | Admitting: Clinical

## 2024-07-26 DIAGNOSIS — F3160 Bipolar disorder, current episode mixed, unspecified: Secondary | ICD-10-CM

## 2024-07-26 DIAGNOSIS — F4381 Prolonged grief disorder: Secondary | ICD-10-CM

## 2024-07-26 DIAGNOSIS — Z658 Other specified problems related to psychosocial circumstances: Secondary | ICD-10-CM

## 2024-07-26 NOTE — BH Specialist Note (Unsigned)
 Integrated Behavioral Health Follow Up In-Person Visit  MRN: [id number] Name: Jordan [name]  Number of Integrated Behavioral Health Clinician visits: 6-Sixth Visit  Session Start time: 1459   Session End time: 1543  Total time in minutes: 44   Types of Service: Individual psychotherapy  Interpretor:No. Interpretor Name and Language: n/a  Subjective: Jordan [name] is a 58 y.o. female accompanied by self Patient was referred by Gloris Hugger, MD for emotional distress. Patient reports the following symptoms/concerns: Irritated about Medicaid transportation taking her to the wrong location and being late today; doesn't have pictures of her deceased partner; unhappy in her current living situation; worries about keeping a similar Medicaid plan when she moves back to [place]  to be with her daughter one day; distressing memories of her siblings.  Duration of problem: Ongoing; Severity of problem: moderate  Objective: Mood: Irritable and Affect: Depressed Risk of harm to self or others: Intention to act on suicide plan  Life Context: Family and Social: Pt lives in assisted living facility with one roommate (blind with Alzheimer's) School/Work: disability Self-Care: continued self-advocacy Life Changes: Loss of partner (02/20/22) and move to assisted living facility  Patient and/or Family's Strengths/Protective Factors: Social connections, Concrete supports in place (healthy food, safe environments, etc.), and Sense of purpose  Goals Addressed: Patient will:  Reduce symptoms of: agitation, anxiety, depression, and mood instability   Increase knowledge and/or ability of: healthy habits and stress reduction   Demonstrate ability to: Increase healthy adjustment to current life circumstances and Increase motivation to adhere to plan of care  Progress towards Goals: Revised and Ongoing  Interventions: Interventions utilized:  Motivational Interviewing and Supportive  Reflection Standardized Assessments completed: Not Needed  Patient and/or Family Response: Patient agrees with treatment plan.   Patient Centered Plan: Patient is on the following Treatment Plan(s): IBH  Clinical Assessment/Diagnosis  Bipolar affective disorder, current episode mixed, current episode severity unspecified (HCC)  Prolonged grief disorder  Psychosocial stressors   Patient may benefit from continued therapeutic intervention.  Plan: Follow up with behavioral health clinician on : Two weeks Behavioral recommendations:  -Continue taking BH medication as prescribed  -Continue self-coping strategies daily (self-advocacy, breathing exercises, shows on phone; advocating for roommate when needed; communication with supportive people in life) -Consider asking others who knew partner if they have pictures of him to send Referral(s): Integrated [employer] (In Clinic)  [place], LCSW

## 2024-08-05 NOTE — BH Specialist Note (Deleted)
 Integrated Behavioral Health Follow Up In-Person Visit  MRN: [id number] Name: Jordan [name]  Number of Integrated Behavioral Health Clinician visits: 6-Sixth Visit  Session Start time: 1459   Session End time: 1543  Total time in minutes: 44    Types of Service: Individual psychotherapy  Interpretor:No. Interpretor Name and Language: n/a  Subjective: Jordan [name] is a 58 y.o. female accompanied by self Patient was referred by *** for ***. Patient reports the following symptoms/concerns: *** Duration of problem: Ongoing; Severity of problem: {Mild/Moderate/Severe:20260}  Objective: Mood: {BHH MOOD:22306} and Affect: {BHH AFFECT:22307} Risk of harm to self or others: {CHL AMB BH Suicide Current Mental Status:21022748}  Life Context: Family and Social: *** School/Work: *** Self-Care: *** Life Changes: ***  Patient and/or Family's Strengths/Protective Factors: {CHL AMB BH PROTECTIVE FACTORS:6108234678}  Goals Addressed: Patient will:  Reduce symptoms of: {IBH Symptoms:21014056}   Increase knowledge and/or ability of: {IBH Patient Tools:21014057}   Demonstrate ability to: {IBH Goals:21014053}  Progress towards Goals: {CHL AMB BH PROGRESS TOWARDS GOALS:9291849757}  Interventions: Interventions utilized:  {IBH Interventions:21014054} Standardized Assessments completed: {IBH Screening Tools:21014051}   Patient and/or Family Response: Patient agrees with treatment plan.  Patient Centered Plan: Patient is on the following Treatment Plan(s): ***  Clinical Assessment/Diagnosis  No diagnosis found.   Patient may benefit from continued therapeutic intervention ***  Plan: Follow up with behavioral health clinician on : *** Behavioral recommendations: *** Referral(s): {IBH Referrals:21014055}  Warren BROCKS [place], LCSW     07/16/2023    5:18 PM 03/27/2023    4:22 PM 07/18/2022    1:43 PM 04/17/2022   11:19 AM 08/20/2021    2:13 PM  Depression screen PHQ 2/9   Decreased Interest 0 0 1 0 0  Down, Depressed, Hopeless 0 1 1 1  0  PHQ - 2 Score 0 1 2 1  0  Altered sleeping 0 2 1 1  0  Tired, decreased energy 0 1 1 0 0  Change in appetite 0 0 1 0 0  Feeling bad or failure about yourself  1 0 0 2 0  Trouble concentrating 1 1  3  0  Moving slowly or fidgety/restless 1 1 1  0 0  Suicidal thoughts 0 0 0 0 0  PHQ-9 Score 3 6 6 7  0      07/16/2023    5:18 PM 03/27/2023    4:25 PM 07/18/2022    1:43 PM 04/17/2022   11:19 AM  GAD 7 : Generalized Anxiety Score  Nervous, Anxious, on Edge 1 2 1 1   Control/stop worrying 1 1 1  0  Worry too much - different things 1 1 1  0  Trouble relaxing 1 1 1  0  Restless 1 1 1  0  Easily annoyed or irritable 2 1 1 1   Afraid - awful might happen 0 1 0 1  Total GAD 7 Score 7 8 6  3

## 2024-08-06 ENCOUNTER — Inpatient Hospital Stay (HOSPITAL_COMMUNITY)
Admission: EM | Admit: 2024-08-06 | Discharge: 2024-08-10 | DRG: 871 | Disposition: A | Discharge status: Nursing Home (Includes ICF) | Payer: MEDICAID | Attending: Internal Medicine | Admitting: Internal Medicine

## 2024-08-06 ENCOUNTER — Other Ambulatory Visit: Payer: Self-pay

## 2024-08-06 ENCOUNTER — Emergency Department (HOSPITAL_COMMUNITY): Payer: MEDICAID

## 2024-08-06 ENCOUNTER — Encounter (HOSPITAL_COMMUNITY): Payer: Self-pay

## 2024-08-06 DIAGNOSIS — I272 Pulmonary hypertension, unspecified: Secondary | ICD-10-CM | POA: Diagnosis present

## 2024-08-06 DIAGNOSIS — N390 Urinary tract infection, site not specified: Secondary | ICD-10-CM | POA: Diagnosis present

## 2024-08-06 DIAGNOSIS — B9789 Other viral agents as the cause of diseases classified elsewhere: Secondary | ICD-10-CM | POA: Diagnosis present

## 2024-08-06 DIAGNOSIS — Z6841 Body Mass Index (BMI) 40.0 and over, adult: Secondary | ICD-10-CM

## 2024-08-06 DIAGNOSIS — J189 Pneumonia, unspecified organism: Principal | ICD-10-CM | POA: Diagnosis present

## 2024-08-06 DIAGNOSIS — B9620 Unspecified Escherichia coli [E. coli] as the cause of diseases classified elsewhere: Secondary | ICD-10-CM | POA: Diagnosis present

## 2024-08-06 DIAGNOSIS — Z79899 Other long term (current) drug therapy: Secondary | ICD-10-CM

## 2024-08-06 DIAGNOSIS — Z8544 Personal history of malignant neoplasm of other female genital organs: Secondary | ICD-10-CM | POA: Diagnosis not present

## 2024-08-06 DIAGNOSIS — Z1152 Encounter for screening for COVID-19: Secondary | ICD-10-CM

## 2024-08-06 DIAGNOSIS — F411 Generalized anxiety disorder: Secondary | ICD-10-CM | POA: Diagnosis present

## 2024-08-06 DIAGNOSIS — Z923 Personal history of irradiation: Secondary | ICD-10-CM

## 2024-08-06 DIAGNOSIS — E872 Acidosis, unspecified: Secondary | ICD-10-CM | POA: Diagnosis present

## 2024-08-06 DIAGNOSIS — Z9981 Dependence on supplemental oxygen: Secondary | ICD-10-CM | POA: Diagnosis not present

## 2024-08-06 DIAGNOSIS — F319 Bipolar disorder, unspecified: Secondary | ICD-10-CM | POA: Diagnosis present

## 2024-08-06 DIAGNOSIS — J9601 Acute respiratory failure with hypoxia: Secondary | ICD-10-CM | POA: Diagnosis present

## 2024-08-06 DIAGNOSIS — Z881 Allergy status to other antibiotic agents status: Secondary | ICD-10-CM

## 2024-08-06 DIAGNOSIS — I5033 Acute on chronic diastolic (congestive) heart failure: Secondary | ICD-10-CM | POA: Diagnosis present

## 2024-08-06 DIAGNOSIS — G8929 Other chronic pain: Secondary | ICD-10-CM | POA: Diagnosis present

## 2024-08-06 DIAGNOSIS — Z90710 Acquired absence of both cervix and uterus: Secondary | ICD-10-CM | POA: Diagnosis not present

## 2024-08-06 DIAGNOSIS — E669 Obesity, unspecified: Secondary | ICD-10-CM | POA: Diagnosis present

## 2024-08-06 DIAGNOSIS — N3090 Cystitis, unspecified without hematuria: Secondary | ICD-10-CM | POA: Diagnosis present

## 2024-08-06 DIAGNOSIS — C52 Malignant neoplasm of vagina: Secondary | ICD-10-CM | POA: Diagnosis present

## 2024-08-06 DIAGNOSIS — R079 Chest pain, unspecified: Secondary | ICD-10-CM | POA: Diagnosis present

## 2024-08-06 DIAGNOSIS — A419 Sepsis, unspecified organism: Principal | ICD-10-CM | POA: Diagnosis present

## 2024-08-06 DIAGNOSIS — Z87440 Personal history of urinary (tract) infections: Secondary | ICD-10-CM | POA: Diagnosis not present

## 2024-08-06 LAB — COMPREHENSIVE METABOLIC PANEL WITH GFR
ALT: 28 U/L (ref 0–44)
AST: 36 U/L (ref 15–41)
Albumin: 3.6 g/dL (ref 3.5–5.0)
Alkaline Phosphatase: 139 U/L — ABNORMAL HIGH (ref 38–126)
Anion gap: 11 (ref 5–15)
BUN: 13 mg/dL (ref 6–20)
CO2: 25 mmol/L (ref 22–32)
Calcium: 8.7 mg/dL — ABNORMAL LOW (ref 8.9–10.3)
Chloride: 105 mmol/L (ref 98–111)
Creatinine, Ser: 0.73 mg/dL (ref 0.44–1.00)
GFR, Estimated: >60 mL/min (ref >60)
Glucose, Bld: 151 mg/dL — ABNORMAL HIGH (ref 70–99)
Potassium: 4.3 mmol/L (ref 3.5–5.1)
Sodium: 140 mmol/L (ref 135–145)
Total Bilirubin: 0.9 mg/dL (ref 0.0–1.2)
Total Protein: 6.4 g/dL — ABNORMAL LOW (ref 6.5–8.1)

## 2024-08-06 LAB — HCG, SERUM, QUALITATIVE: Preg, Serum: NEGATIVE

## 2024-08-06 LAB — RESPIRATORY PANEL BY PCR

## 2024-08-06 LAB — CBC WITH DIFFERENTIAL/PLATELET
Abs Immature Granulocytes: 0.05 K/uL (ref 0.00–0.07)
Basophils Absolute: 0 K/uL (ref 0.0–0.1)
Basophils Relative: 0 %
Eosinophils Absolute: 0 K/uL (ref 0.0–0.5)
Eosinophils Relative: 0 %
HCT: 38 % (ref 36.0–46.0)
Hemoglobin: 12.1 g/dL (ref 12.0–15.0)
Immature Granulocytes: 0 %
Lymphocytes Relative: 3 %
Lymphs Abs: 0.4 K/uL — ABNORMAL LOW (ref 0.7–4.0)
MCH: 29.8 pg (ref 26.0–34.0)
MCHC: 31.8 g/dL (ref 30.0–36.0)
MCV: 93.6 fL (ref 80.0–100.0)
Monocytes Absolute: 0.8 K/uL (ref 0.1–1.0)
Monocytes Relative: 6 %
Neutro Abs: 12.2 K/uL — ABNORMAL HIGH (ref 1.7–7.7)
Neutrophils Relative %: 91 %
Platelets: 194 K/uL (ref 150–400)
RBC: 4.06 MIL/uL (ref 3.87–5.11)
RDW: 13.5 % (ref 11.5–15.5)
WBC: 13.4 K/uL — ABNORMAL HIGH (ref 4.0–10.5)
nRBC: 0 % (ref 0.0–0.2)

## 2024-08-06 LAB — I-STAT CG4 LACTIC ACID, ED
Lactic Acid, Venous: 2 mmol/L (ref 0.5–1.9)
Lactic Acid, Venous: 1.8 mmol/L (ref 0.5–1.9)

## 2024-08-06 LAB — URINALYSIS, W/ REFLEX TO CULTURE (INFECTION SUSPECTED)
Glucose, UA: NEGATIVE mg/dL
Hgb urine dipstick: NEGATIVE
Ketones, ur: NEGATIVE mg/dL
Nitrite: NEGATIVE
Protein, ur: 30 mg/dL — AB
Specific Gravity, Urine: 1.028 (ref 1.005–1.030)
WBC, UA: >50 WBC/hpf (ref 0–5)
pH: 7 (ref 5.0–8.0)

## 2024-08-06 LAB — RESP PANEL BY RT-PCR (RSV, FLU A&B, COVID)  RVPGX2
Influenza A by PCR: NEGATIVE
Influenza B by PCR: NEGATIVE
Resp Syncytial Virus by PCR: NEGATIVE
SARS Coronavirus 2 by RT PCR: NEGATIVE

## 2024-08-06 LAB — LIPASE, BLOOD: Lipase: 23 U/L (ref 11–51)

## 2024-08-06 LAB — I-STAT CHEM 8, ED
BUN: 11 mg/dL (ref 6–20)
Calcium, Ion: 1.13 mmol/L — ABNORMAL LOW (ref 1.15–1.40)
Chloride: 104 mmol/L (ref 98–111)
Creatinine, Ser: 0.7 mg/dL (ref 0.44–1.00)
Glucose, Bld: 153 mg/dL — ABNORMAL HIGH (ref 70–99)
HCT: 36 % (ref 36.0–46.0)
Hemoglobin: 12.2 g/dL (ref 12.0–15.0)
Potassium: 4.2 mmol/L (ref 3.5–5.1)
Sodium: 140 mmol/L (ref 135–145)
TCO2: 25 mmol/L (ref 22–32)

## 2024-08-06 LAB — BLOOD GAS, VENOUS
Acid-base deficit: 0.1 mmol/L (ref 0.0–2.0)
Bicarbonate: 23.9 mmol/L (ref 20.0–28.0)
O2 Saturation: 98.7 %
Patient temperature: 37
pCO2, Ven: 36 mmHg — ABNORMAL LOW (ref 44–60)
pH, Ven: 7.43 (ref 7.25–7.43)
pO2, Ven: 180 mmHg — ABNORMAL HIGH (ref 32–45)

## 2024-08-06 LAB — TROPONIN T, HIGH SENSITIVITY
Troponin T High Sensitivity: <15 ng/L (ref 0–19)
Troponin T High Sensitivity: <15 ng/L (ref 0–19)

## 2024-08-06 LAB — PRO BRAIN NATRIURETIC PEPTIDE: Pro Brain Natriuretic Peptide: 366 pg/mL — ABNORMAL HIGH (ref <300.0)

## 2024-08-06 LAB — PROTIME-INR
INR: 1 (ref 0.8–1.2)
Prothrombin Time: 14.1 s (ref 11.4–15.2)

## 2024-08-06 LAB — D-DIMER, QUANTITATIVE: D-Dimer, Quant: 1.51 ug{FEU}/mL — ABNORMAL HIGH (ref 0.00–0.50)

## 2024-08-06 LAB — PROCALCITONIN: Procalcitonin: 0.8 ng/mL

## 2024-08-06 MED ORDER — LINEZOLID 600 MG/300ML IV SOLN
600.0000 mg | Freq: Two times a day (BID) | INTRAVENOUS | Status: DC
  Administered 2024-08-06 – 2024-08-09 (×7): 600 mg via INTRAVENOUS
  Filled 2024-08-06 (×7): qty 300

## 2024-08-06 MED ORDER — LACTATED RINGERS IV SOLN: INTRAVENOUS | Status: DC

## 2024-08-06 MED ORDER — ACETAMINOPHEN 650 MG RE SUPP: 650.0000 mg | Freq: Four times a day (QID) | RECTAL | Status: DC | PRN

## 2024-08-06 MED ORDER — SODIUM CHLORIDE 0.9 % IV SOLN
1.0000 g | Freq: Three times a day (TID) | INTRAVENOUS | Status: DC
  Administered 2024-08-06 – 2024-08-09 (×11): 1 g via INTRAVENOUS
  Filled 2024-08-06 (×12): qty 20

## 2024-08-06 MED ORDER — LACTATED RINGERS IV BOLUS (SEPSIS)
2600.0000 mL | Freq: Once | INTRAVENOUS | Status: AC
  Administered 2024-08-06: 2600 mL via INTRAVENOUS

## 2024-08-06 MED ORDER — HYDROCODONE-ACETAMINOPHEN 5-325 MG PO TABS
1.0000 | ORAL_TABLET | ORAL | Status: DC | PRN
  Administered 2024-08-07 – 2024-08-08 (×2): 1 via ORAL
  Administered 2024-08-08 – 2024-08-10 (×4): 2 via ORAL
  Filled 2024-08-06 (×2): qty 2
  Filled 2024-08-06 (×2): qty 1
  Filled 2024-08-06 (×2): qty 2

## 2024-08-06 MED ORDER — FUROSEMIDE 10 MG/ML IJ SOLN
20.0000 mg | Freq: Once | INTRAMUSCULAR | Status: AC
  Administered 2024-08-06: 20 mg via INTRAVENOUS
  Filled 2024-08-06: qty 4

## 2024-08-06 MED ORDER — ONDANSETRON HCL 4 MG PO TABS: 4.0000 mg | ORAL_TABLET | Freq: Four times a day (QID) | ORAL | Status: DC | PRN

## 2024-08-06 MED ORDER — ONDANSETRON HCL 4 MG/2ML IJ SOLN: 4.0000 mg | Freq: Four times a day (QID) | INTRAMUSCULAR | Status: DC | PRN

## 2024-08-06 MED ORDER — MORPHINE SULFATE (PF) 2 MG/ML IV SOLN
2.0000 mg | INTRAVENOUS | Status: DC | PRN
Start: 2024-08-06 — End: 2024-08-11
  Administered 2024-08-06 – 2024-08-09 (×17): 2 mg via INTRAVENOUS
  Filled 2024-08-06 (×18): qty 1

## 2024-08-06 MED ORDER — METOPROLOL TARTRATE 5 MG/5ML IV SOLN: 5.0000 mg | Freq: Four times a day (QID) | INTRAVENOUS | Status: DC | PRN

## 2024-08-06 MED ORDER — ACETAMINOPHEN 325 MG PO TABS: 650.0000 mg | ORAL_TABLET | Freq: Four times a day (QID) | ORAL | Status: DC | PRN

## 2024-08-06 MED ORDER — GABAPENTIN 400 MG PO CAPS
800.0000 mg | ORAL_CAPSULE | Freq: Three times a day (TID) | ORAL | Status: DC
  Administered 2024-08-07 – 2024-08-10 (×11): 800 mg via ORAL
  Filled 2024-08-06 (×11): qty 2

## 2024-08-06 MED ORDER — ENOXAPARIN SODIUM 80 MG/0.8ML IJ SOSY
80.0000 mg | PREFILLED_SYRINGE | INTRAMUSCULAR | Status: DC
  Administered 2024-08-06 – 2024-08-09 (×4): 80 mg via SUBCUTANEOUS
  Filled 2024-08-06 (×5): qty 0.8

## 2024-08-06 MED ORDER — IOHEXOL 350 MG/ML SOLN
100.0000 mL | Freq: Once | INTRAVENOUS | Status: AC | PRN
  Administered 2024-08-06: 100 mL via INTRAVENOUS

## 2024-08-06 MED ORDER — KETOROLAC TROMETHAMINE 15 MG/ML IJ SOLN
15.0000 mg | Freq: Once | INTRAMUSCULAR | Status: AC
  Administered 2024-08-06: 15 mg via INTRAVENOUS
  Filled 2024-08-06: qty 1

## 2024-08-06 NOTE — Assessment & Plan Note (Signed)
 Continue gabapentin  800mg  TID Pmp website confirmed

## 2024-08-06 NOTE — Assessment & Plan Note (Deleted)
 Atypical and reproducible over right anterior chest wall  Troponin wnl x 2 and no acute changes on EKG  Clinically indicative MSK etiology, but echo pending for CHF exac  Heating pad only if afebrile  Conservative therapy

## 2024-08-06 NOTE — ED Notes (Signed)
 Purewick placed on Pt.

## 2024-08-06 NOTE — Assessment & Plan Note (Signed)
 Atypical and reproducible over right anterior chest wall  Troponin wnl x 2 and no acute changes on EKG  Clinically indicative MSK etiology, but echo pending for CHF exac  Heating pad only if afebrile  Conservative therapy

## 2024-08-06 NOTE — ED Notes (Signed)
 Pt requesting to eat. Dr. Waddell notified for diet order

## 2024-08-06 NOTE — Assessment & Plan Note (Signed)
 Med rec still not done. Waiting on fax from SNF

## 2024-08-06 NOTE — ED Notes (Signed)
 Pt hasn't been able to produce a urine specimen

## 2024-08-06 NOTE — ED Notes (Signed)
 Lab called to add on procalcitonin and 20 pathogen respiratory swab

## 2024-08-06 NOTE — ED Provider Notes (Signed)
 [place] EMERGENCY DEPARTMENT AT [facility] Provider Note   CSN: [id number] Arrival date & time: 08/06/24  0746     History Chief Complaint  Patient presents with   Fever    HPI: Latoia Eyster is a 58 y.o. female with history pertinent for bipolar disorder, elevated BMI, chronic pain, pancytopenia, prior ESBL infection, CHF, vulvar cancer/atrophy, prior UTI who presents complaining of several complaints. Patient arrived via EMS from facility Social worker).  History provided by patient and EMS.  No interpreter required during this encounter.  Patient reports that she has chronic vulvar pain and dysuria, however reports that this has been progressively worsening over the past month.  Reports that over the past several days she has also had worsening abdominal pain, and chest pain on exertion.  Reports that she has had fever for the past several days which has been worsening.  Patient developed nonproductive cough this morning, as well as a headache that she reports is her typical headache that she gets when she is sick.  Reports that she had dizziness upon standing this morning, therefore was unable to go to the bathroom, therefore EMS was called.  EMS reports that en route patient was tachycardic, febrile, saturating in the upper 80s on room air, administered 650 Tylenol  and route, no additional interventions and route.  Patient's recorded medical, surgical, social, medication list and allergies were reviewed in the Snapshot window as part of the initial history.   Prior to Admission medications   Medication Sig Start Date End Date Taking? Authorizing Provider  acetaminophen  (TYLENOL ) 325 MG tablet Take 2 tablets (650 mg total) by mouth every 6 (six) hours as needed for mild pain (pain score 1-3), fever or headache. 09/17/23   Danton Lauraine LABOR, PA-C  BAZA PROTECT MOISTURE BARRIER 12 % CREA Apply 1 application  topically See admin instructions. Apply to reddened, diaper area  after each cleansing    [provider]  cephALEXin  (KEFLEX ) 500 MG capsule Take 1 capsule (500 mg total) by mouth 4 (four) times daily. Patient not taking: Reported on 06/09/2024 01/19/24   Odell Balls, PA-C  desvenlafaxine  (PRISTIQ ) 50 MG 24 hr tablet Take 50 mg by mouth every morning. 07/07/23   [provider]  divalproex  (DEPAKOTE ) 125 MG DR tablet Take 125 mg by mouth 3 (three) times daily.    [provider]  doxycycline  (VIBRAMYCIN ) 100 MG capsule Take 1 capsule (100 mg total) by mouth 2 (two) times daily. 05/02/24   Prosperi, Christian H, PA-C  estradiol  (VIVELLE -DOT) 0.075 MG/24HR Place 1 patch onto the skin 2 (two) times a week. 04/19/24   Anyanwu, Ugonna A, MD  gabapentin  (NEURONTIN ) 800 MG tablet Take 800 mg by mouth 3 (three) times daily. 01/11/22   [provider]  loperamide  (IMODIUM ) 2 MG capsule Take 1 capsule (2 mg total) by mouth every 8 (eight) hours as needed for diarrhea or loose stools. 09/29/23   Rai, Nydia POUR, MD  meclizine  (ANTIVERT ) 25 MG tablet Take 25 mg by mouth every 6 (six) hours as needed for dizziness.    [provider]  Multiple Vitamin (MULTIVITAMIN WITH MINERALS) TABS tablet Take 1 tablet by mouth daily. Patient not taking: Reported on 06/09/2024 09/30/23   Davia Nydia POUR, MD  Multiple Vitamin (TAB-A-VITE) TABS Take 1 tablet by mouth daily with breakfast.    [provider]  nitrofurantoin , macrocrystal-monohydrate, (MACROBID ) 100 MG capsule Take 1 capsule (100 mg total) by mouth 2 (two) times daily. 06/29/24  Ozan, Jennifer, DO  nystatin  powder Apply 1 Application topically 2 (two) times daily as needed (for groin irritation).    [provider]  oxyCODONE  (ROXICODONE ) 15 MG immediate release tablet Take 15 mg by mouth every 4 (four) hours as needed for pain.    [provider]  polyethylene glycol (MIRALAX  / GLYCOLAX ) 17 g packet Take 17 g by mouth daily. 12/27/23   Perri DELENA Meliton Mickey., MD   QUEtiapine  (SEROQUEL ) 50 MG tablet Take 50 mg by mouth in the morning and at bedtime. Patient not taking: Reported on 06/09/2024    [provider]     Allergies: Ceftriaxone , Ciprofloxacin , Citalopram , Lamotrigine , Sulfa  antibiotics, and Tramadol   Review of Systems   ROS as per HPI  Physical Exam Updated Vital Signs BP (!) 118/59   Pulse 95   Temp 98.1 F (36.7 C) (Oral)   Resp 19   Wt (!) 177.4 kg   LMP  (LMP Unknown)   SpO2 97%   BMI 59.45 kg/m  Physical Exam Vitals and nursing note reviewed.  Constitutional:      General: She is not in acute distress.    Appearance: She is well-developed. She is obese.  HENT:     Head: Normocephalic and atraumatic.  Eyes:     Extraocular Movements: Extraocular movements intact.     Conjunctiva/sclera: Conjunctivae normal.  Cardiovascular:     Rate and Rhythm: Regular rhythm. Tachycardia present.     Heart sounds: No murmur heard. Pulmonary:     Effort: Pulmonary effort is normal. No respiratory distress.     Breath sounds: Normal breath sounds.  Abdominal:     Palpations: Abdomen is soft.     Tenderness: There is abdominal tenderness (Diffusely, most predominant in the right hemiabdomen).  Musculoskeletal:        General: No swelling.     Cervical back: Neck supple.  Skin:    General: Skin is warm and dry.     Capillary Refill: Capillary refill takes less than 2 seconds.  Neurological:     Mental Status: She is alert.  Psychiatric:        Mood and Affect: Mood normal.     ED Course/ Medical Decision Making/ A&P    Procedures .Critical Care  Performed by: Rogelia Jerilynn RAMAN, MD Authorized by: Rogelia Jerilynn RAMAN, MD   Critical care provider statement:    Critical care time (minutes):  31   Critical care was necessary to treat or prevent imminent or life-threatening deterioration of the following conditions:  Sepsis and shock   Critical care was time spent personally by me on the following activities:   Development of treatment plan with patient or surrogate, discussions with consultants, evaluation of patient's response to treatment, examination of patient, ordering and review of laboratory studies, ordering and review of radiographic studies, ordering and performing treatments and interventions, pulse oximetry, re-evaluation of patient's condition and review of old charts    Medications Ordered in ED Medications  lactated ringers  infusion ( Intravenous New Bag/Given 08/06/24 1226)  linezolid (ZYVOX) IVPB 600 mg (0 mg Intravenous Stopped 08/06/24 1030)  meropenem (MERREM) 1 g in sodium chloride  0.9 % 100 mL IVPB (0 g Intravenous Stopped 08/06/24 1528)  morphine  (PF) 2 MG/ML injection 2 mg (2 mg Intravenous Given 08/06/24 1213)  furosemide  (LASIX ) injection 20 mg (has no administration in time range)  lactated ringers  bolus 2,600 mL (0 mLs Intravenous Stopped 08/06/24 1235)  ketorolac  (TORADOL ) 15 MG/ML injection 15 mg (  15 mg Intravenous Given 08/06/24 1029)  iohexol  (OMNIPAQUE ) 350 MG/ML injection 100 mL (100 mLs Intravenous Contrast Given 08/06/24 1257)    Medical Decision Making:   Aubery Date is a 58 y.o. female who presents for numerous complaints as per above.  Physical exam is pertinent for tachycardia, right hemiabdominal tenderness, hypoxia on room air improved on 2 L nasal cannula.  The differential includes but is not limited to sepsis, Sirs, pneumonia, PE, ACS, UTI, pyelonephritis, hepatobiliary pathology.  Independent historian: EMS  External data reviewed: Labs: Reviewed patient's prior culture data, on 02/02/24, urine culture grew resistant Enterococcus faecium and Klebsiella pneumoniae, and urine culture from 12/23/23 grew resistant Morganella morganii I as well as Klebsiella pneumoniae  Labs: Ordered, Independent interpretation, and Details: CBC with leukocytosis to 13, no anemia or thrombocytopenia.  CMP without AKI, marginal dry gentian, emergent LFT abnormality.  Lipase  WNL.  Serum pregnancy negative.  UA with LE, WBC, bacteria concerning for UTI. Initial lactic acid elevated at 2, repeat improved at 1.8.  Coags WNL.  D-dimer elevated at 1.5.  Djan delta troponin negative.  RVP negative.  Radiology: Ordered, Independent interpretation, Details: Chest x-ray with increased right sided airspace opacifications concerning for pneumonia, no pleural effusion, pneumothorax, cardiomediastinal sweat derangement.  CT PE study without large central PE, redemonstrated parenchymal infiltrates consistent with pneumonia.  CT of the abdomen and pelvis without free air, significant free fluid, focal fluid collection, obstructive pathology, and All images reviewed independently.  Agree with radiology report at this time.   CT Angio Chest PE W and/or Wo Contrast Result Date: 08/06/2024 CLINICAL DATA:  Pulmonary embolism (PE) suspected, low to intermediate prob, positive D-dimer; Sepsis right sided abdominal pain, dysuria, sepsis. History of squamous cell carcinoma of the vagina. * Tracking Code: BO * EXAM: CT ANGIOGRAPHY CHEST CT ABDOMEN AND PELVIS WITH CONTRAST TECHNIQUE: Multidetector CT imaging of the chest was performed using the standard protocol during bolus administration of intravenous contrast. Multiplanar CT image reconstructions and MIPs were obtained to evaluate the vascular anatomy. Multidetector CT imaging of the abdomen and pelvis was performed using the standard protocol during bolus administration of intravenous contrast. RADIATION DOSE REDUCTION: This exam was performed according to the departmental dose-optimization program which includes automated exposure control, adjustment of the mA and/or kV according to patient size and/or use of iterative reconstruction technique. CONTRAST:  OMNIPAQUE  IOHEXOL  350 MG/ML SOLN COMPARISON:  CT venogram abdomen and pelvis from 03/16/2024 and nuclear medicine PET scan from 09/15/2020. FINDINGS: CTA CHEST FINDINGS Cardiovascular: No  evidence of embolism to the proximal subsegmental pulmonary artery level. Normal cardiac size. No pericardial effusion. No aortic aneurysm. There are coronary artery calcifications, in keeping with coronary artery disease. There are also mild peripheral atherosclerotic vascular calcifications of thoracic aorta and its major branches. There is dilation of the main pulmonary trunk measuring up to 3.2 cm, which is nonspecific but can be seen with pulmonary artery hypertension. Mediastinum/Nodes: Visualized thyroid gland appears grossly unremarkable. No solid / cystic mediastinal masses. The esophagus is nondistended precluding optimal assessment. There are few mildly prominent mediastinal lymph nodes, which do not meet the size criteria for lymphadenopathy and though indeterminate most likely benign in etiology. No axillary or hilar lymphadenopathy by size criteria. Lungs/Pleura: The central tracheo-bronchial tree is patent. There are heterogeneous airspace opacities throughout the right lung without volume loss, compatible with multilobar pneumonia. Follow-up to clearing is recommended. There is mosaic attenuation of predominantly left lung, consistent with heterogeneous air trapping related to small airways  disease. No mass or consolidation. No pleural effusion or pneumothorax. No suspicious lung nodules. There is a stable sub 6 mm nodule in the right lung lower lobe (series 18, image 11), which is unchanged since the prior study from 12/10/2022. Musculoskeletal: The visualized soft tissues of the chest wall are grossly unremarkable. No suspicious osseous lesions. There are mild multilevel degenerative changes in the visualized spine. Review of the MIP images confirms the above findings. CT ABDOMEN and PELVIS FINDINGS Hepatobiliary: The liver is normal in size. Non-cirrhotic configuration. No suspicious mass. No intrahepatic or extrahepatic bile duct dilation. Gallbladder is surgically absent. Pancreas: Unremarkable.  No pancreatic ductal dilatation or surrounding inflammatory changes. Spleen: Within normal limits. No focal lesion. Adrenals/Urinary Tract: Adrenal glands are unremarkable. No suspicious renal mass. There is a partially exophytic 3.6 x 4.0 cm cyst arising from the right kidney upper pole. There is a 2-3 mm nonobstructing calculus in the right kidney upper pole. No other nephroureterolithiasis or obstructive uropathy on either side. Urinary bladder is under distended, precluding optimal assessment. However, no large mass or stones identified. No perivesical fat stranding. Stomach/Bowel: There is a small diverticulum arising from the second part of duodenum. No disproportionate dilation of the small or large bowel loops. No evidence of abnormal bowel wall thickening or inflammatory changes. The appendix is unremarkable. Vascular/Lymphatic: No ascites or pneumoperitoneum. No abdominal or pelvic lymphadenopathy, by size criteria. No aneurysmal dilation of the major abdominal arteries. There are mild peripheral atherosclerotic vascular calcifications of the aorta and its major branches. Reproductive: The uterus is surgically absent. No large adnexal mass. Other: There is a tiny fat containing umbilical hernia. The soft tissues and abdominal wall are otherwise unremarkable. Musculoskeletal: No suspicious osseous lesions. There are mild multilevel degenerative changes in the visualized spine. Redemonstration of mild anterior wedging deformity of L2 vertebral body, similar to the prior study. There is a stable hemangioma in the posterior aspect of L1 vertebral body. Review of the MIP images confirms the above findings. IMPRESSION: 1. No embolism to the proximal subsegmental pulmonary artery level. 2. Multilobar pneumonia involving the right lung. Follow-up to clearing is recommended. 3. No acute inflammatory process identified within the abdomen or pelvis. 4. No metastatic disease identified within the chest, abdomen or  pelvis. 5. Multiple other nonacute observations, as described above. Aortic Atherosclerosis (ICD10-I70.0). Electronically Signed   By: Ree Molt M.D.   On: 08/06/2024 13:56   CT ABDOMEN PELVIS W CONTRAST Result Date: 08/06/2024 CLINICAL DATA:  Pulmonary embolism (PE) suspected, low to intermediate prob, positive D-dimer; Sepsis right sided abdominal pain, dysuria, sepsis. History of squamous cell carcinoma of the vagina. * Tracking Code: BO * EXAM: CT ANGIOGRAPHY CHEST CT ABDOMEN AND PELVIS WITH CONTRAST TECHNIQUE: Multidetector CT imaging of the chest was performed using the standard protocol during bolus administration of intravenous contrast. Multiplanar CT image reconstructions and MIPs were obtained to evaluate the vascular anatomy. Multidetector CT imaging of the abdomen and pelvis was performed using the standard protocol during bolus administration of intravenous contrast. RADIATION DOSE REDUCTION: This exam was performed according to the departmental dose-optimization program which includes automated exposure control, adjustment of the mA and/or kV according to patient size and/or use of iterative reconstruction technique. CONTRAST:  OMNIPAQUE  IOHEXOL  350 MG/ML SOLN COMPARISON:  CT venogram abdomen and pelvis from 03/16/2024 and nuclear medicine PET scan from 09/15/2020. FINDINGS: CTA CHEST FINDINGS Cardiovascular: No evidence of embolism to the proximal subsegmental pulmonary artery level. Normal cardiac size. No pericardial effusion. No aortic  aneurysm. There are coronary artery calcifications, in keeping with coronary artery disease. There are also mild peripheral atherosclerotic vascular calcifications of thoracic aorta and its major branches. There is dilation of the main pulmonary trunk measuring up to 3.2 cm, which is nonspecific but can be seen with pulmonary artery hypertension. Mediastinum/Nodes: Visualized thyroid gland appears grossly unremarkable. No solid / cystic mediastinal  masses. The esophagus is nondistended precluding optimal assessment. There are few mildly prominent mediastinal lymph nodes, which do not meet the size criteria for lymphadenopathy and though indeterminate most likely benign in etiology. No axillary or hilar lymphadenopathy by size criteria. Lungs/Pleura: The central tracheo-bronchial tree is patent. There are heterogeneous airspace opacities throughout the right lung without volume loss, compatible with multilobar pneumonia. Follow-up to clearing is recommended. There is mosaic attenuation of predominantly left lung, consistent with heterogeneous air trapping related to small airways disease. No mass or consolidation. No pleural effusion or pneumothorax. No suspicious lung nodules. There is a stable sub 6 mm nodule in the right lung lower lobe (series 18, image 11), which is unchanged since the prior study from 12/10/2022. Musculoskeletal: The visualized soft tissues of the chest wall are grossly unremarkable. No suspicious osseous lesions. There are mild multilevel degenerative changes in the visualized spine. Review of the MIP images confirms the above findings. CT ABDOMEN and PELVIS FINDINGS Hepatobiliary: The liver is normal in size. Non-cirrhotic configuration. No suspicious mass. No intrahepatic or extrahepatic bile duct dilation. Gallbladder is surgically absent. Pancreas: Unremarkable. No pancreatic ductal dilatation or surrounding inflammatory changes. Spleen: Within normal limits. No focal lesion. Adrenals/Urinary Tract: Adrenal glands are unremarkable. No suspicious renal mass. There is a partially exophytic 3.6 x 4.0 cm cyst arising from the right kidney upper pole. There is a 2-3 mm nonobstructing calculus in the right kidney upper pole. No other nephroureterolithiasis or obstructive uropathy on either side. Urinary bladder is under distended, precluding optimal assessment. However, no large mass or stones identified. No perivesical fat stranding.  Stomach/Bowel: There is a small diverticulum arising from the second part of duodenum. No disproportionate dilation of the small or large bowel loops. No evidence of abnormal bowel wall thickening or inflammatory changes. The appendix is unremarkable. Vascular/Lymphatic: No ascites or pneumoperitoneum. No abdominal or pelvic lymphadenopathy, by size criteria. No aneurysmal dilation of the major abdominal arteries. There are mild peripheral atherosclerotic vascular calcifications of the aorta and its major branches. Reproductive: The uterus is surgically absent. No large adnexal mass. Other: There is a tiny fat containing umbilical hernia. The soft tissues and abdominal wall are otherwise unremarkable. Musculoskeletal: No suspicious osseous lesions. There are mild multilevel degenerative changes in the visualized spine. Redemonstration of mild anterior wedging deformity of L2 vertebral body, similar to the prior study. There is a stable hemangioma in the posterior aspect of L1 vertebral body. Review of the MIP images confirms the above findings. IMPRESSION: 1. No embolism to the proximal subsegmental pulmonary artery level. 2. Multilobar pneumonia involving the right lung. Follow-up to clearing is recommended. 3. No acute inflammatory process identified within the abdomen or pelvis. 4. No metastatic disease identified within the chest, abdomen or pelvis. 5. Multiple other nonacute observations, as described above. Aortic Atherosclerosis (ICD10-I70.0). Electronically Signed   By: Ree Molt M.D.   On: 08/06/2024 13:56   DG Chest Portable 1 View Result Date: 08/06/2024 CLINICAL DATA:  Shortness of breath, hypoxia, sepsis. EXAM: PORTABLE CHEST 1 VIEW COMPARISON:  01/19/2024 FINDINGS: Stable enlarged cardiac silhouette. Stable mild diffuse chronic interstitial prominence. Mild  increase in prominence of the pulmonary vasculature. Interval mild patchy density in the right mid and lower lung zones. Unremarkable  bones. IMPRESSION: 1. Interval mild pneumonia in the right mid and lower lung zones. 2. Interval mild pulmonary vascular congestion. 3. Stable cardiomegaly and mild chronic interstitial lung disease. Electronically Signed   By: Elspeth Bathe M.D.   On: 08/06/2024 09:00   EKG/Medicine tests: Ordered and Independent interpretation EKG Interpretation: Sinus tachycardia Abnormal R-wave progression, early transition Confirmed by Rogelia Satterfield ([id number]) on 08/06/2024 5:27:20 PM                 Interventions: Toradol , LR, meropenem, linezolid, morphine   See the EMR for full details regarding lab and imaging results.  Patient presents to the emergency department for numerous complaints across numerous body systems.  Patient is normotensive, however tachycardic, febrile, tachypneic, and saturating in the upper 80s with an appropriate waveform.  Patient meets sepsis criteria, therefore activated as code sepsis, will administer 30 cc/kg bolus adjusted for BMI, broad labs that also evaluate for abdominal pain and chest pain.  Additionally I reviewed the patient's culture data, patient has a history of multidrug-resistant infections, spoke with ED pharmacist for further guidance on antibiotic selection, and they recommended discussing with infectious disease pharmacist given patient's multiple drug-resistant infections.  Discussed with Damien Quiet, [facility], who recommended linezolid and meropenem.  Lab workup reveals UA with UTI, elevation of D-dimer, as well as concern for pneumonia on chest x-ray.  Given elevated D-dimer, tachycardia, CT PE study was obtained and fortunately does not demonstrate PE, however does demonstrate multilobar pneumonia.  Patient with improvement in vitals after Toradol  for defervescence as well as 30 cc/kg adjusted fluid bolus.  Given patient's history of multidrug-resistant infections, as well as sepsis do feel that she requires admission.  Medicine consulted for admission, discussed with  Dr. Gala who accepted the patient to her service.  No additional acute events while patient was under my care.  Presentation is most consistent with acute complicated illness and I did consider and rule out acute life/limb-threatening illness  Discussion of management or test interpretations with external provider(s): Dr. Gala, hospitalists.  Risk Drugs:Prescription drug management and Parenteral controlled substances Treatment: Decision regarding hospitalization Critical Care: 31 minutes  Disposition: ADMIT: I believe the patient requires admission for further care and management. The patient was admitted to hospitalists. Please see inpatient provider note for additional treatment plan details.   MDM generated using voice dictation software and may contain dictation errors.  Please contact me for any clarification or with any questions.  Clinical Impression:  1. Pneumonia of right lung due to infectious organism, unspecified part of lung   2. Cystitis      Admit   Final Clinical Impression(s) / ED Diagnoses Final diagnoses:  Pneumonia of right lung due to infectious organism, unspecified part of lung  Cystitis    Rx / DC Orders ED Discharge Orders     None        Rogelia Satterfield RAMAN, MD 08/06/24 1730

## 2024-08-06 NOTE — Assessment & Plan Note (Signed)
 S/p radiation

## 2024-08-06 NOTE — ED Triage Notes (Signed)
   From [facility] Uti like symptoms for about a month and progressively worse  Tylenol  650mg  for fever 101 initially follow up 99.7  Dysuria polyuria foul odor to urine body aches esp lower back and HA  Right sided interior chest pain   No noted difficulty breathing Spo2 has been in the 80s on transport

## 2024-08-06 NOTE — H&P (Signed)
 History and Physical    Patient: Jordan [name] FMW:[id number] DOB: 08-03-1966 DOA: 08/06/2024 DOS: the patient was seen and examined on 08/06/2024 PCP: Patient, No Pcp Per  Patient coming from: SNF [facility]. Uses WC.    Chief Complaint: dizzy/light headed, cough/fever.   HPI: Shawanna Zanders is a 58 y.o. female with medical history significant of SCC of vagina, bipolar disorder, anemia, alcohol abuse who presents to ED from her SNF for UTI symptoms. She has had dysuria x 1 month that has progressively gotten worse. She has dysuria, urgency and frequency.  She also had fever, cough that started about one week ago. Cough is dry in nature. She has felt short of breath all week. Fever started today. She denies any leg swelling, orthopnea, or weight gain.   She also endorses chest pain that started today. Chest pain is in under her right breast. Reproducible. Does not radiate. Rates it a 10/10. Described as dull. Medicine has helped the pain.   Dizziness and lightheaded started this morning. She needed help getting out of bed. She is not currently dizzy, but hasn't tried to get up.   Denies any vision changes, +headaches, +chest pain, no palpitations,  abdominal pain, N/V/D or leg swelling.   She does not smoke or drink alcohol.   ER Course:  vitals: tmax: 101.5, bp: 129/80, HR; 129, RR: 19, oxygen 89>97%on 2L oxygen Pertinent labs: wbc: 13.4, pro bnp: 366, lactic acid: 2.0>1.8, d-dimer; 1.5  CXR: interval mild pneumonia in the right mid and lower lung zones. Interval mild pulmonary vascular congestion. Stable cardiomegaly and mild chronic interstitial lung disease.  CTA chest: no PE. Multilobar pneumonia involving right lung. F/u to clearing is recommended.  CT abdomen/pelvis: no acute process identified In ED: code sepsis activated. Given 30mg /kg IVF bolus, started on meropenem and linezolid. BC were obtained. Urine culture pending. TR[facility] asked to admit.   Review of Systems: As mentioned in  the history of present illness. All other systems reviewed and are negative. Past Medical History:  Diagnosis Date   Alcohol abuse    Anemia    patient denies   Bipolar 1 disorder (HCC)    No medications currently   CAP (community acquired pneumonia) 03/17/2015   History of radiation therapy 08/24/19-09/22/19   Vaginal brachytherapy   Dr. 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   Mental disorder    Obesity    PICC line infection 05/17/2015   Sepsis due to Gram negative bacteria (MDR E Coli) 02/18/2015   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HCC)    UTI (lower urinary tract infection)    Vaginal Pap smear, abnormal    Past Surgical History:  Procedure Laterality Date   ABDOMINAL HYSTERECTOMY     CERVICAL CONIZATION W/BX N/A 07/01/2016   Procedure: CONIZATION CERVIX WITH BIOPSY;  Surgeon: Ozell LITTIE Cowman, MD;  Location: [facility];  Service: Gynecology;  Laterality: N/A;   CHOLECYSTECTOMY     EXTERNAL FIXATION LEG Right 04/09/2020   Procedure: EXTERNAL FIXATION LEG;  Surgeon: Cristy Bonner DASEN, MD;  Location: [facility];  Service: Orthopedics;  Laterality: Right;   HYSTEROSCOPY WITH D & C N/A 01/25/2016   Procedure: DILATATION AND CURETTAGE /HYSTEROSCOPY;  Surgeon: Winton Felt, MD;  Location: [facility];  Service: Gynecology;  Laterality: N/A;   LYMPH NODE BIOPSY Bilateral 07/22/2019   Procedure: LYMPH NODE BIOPSY;  Surgeon: Eloy Herring, MD;  Location: [facility];  Service: Gynecology;  Laterality: Bilateral;   OPEN REDUCTION INTERNAL  FIXATION (ORIF) FOOT LISFRANC FRACTURE Right 09/15/2023   Procedure: OPEN REDUCTION INTERNAL FIXATION (ORIF) FOOT LISFRANC FRACTURE;  Surgeon: Kendal Franky SQUIBB, MD;  Location: [facility];  Service: Orthopedics;  Laterality: Right;   OPEN REDUCTION INTERNAL FIXATION (ORIF) TIBIA/FIBULA FRACTURE Right 04/10/2020   Procedure: OPEN REDUCTION INTERNAL FIXATION (ORIF) TIBIA/FIBULA FRACTURE;  Surgeon: Kendal Franky SQUIBB, MD;  Location: [facility];  Service:  Orthopedics;  Laterality: Right;   ROBOT ASSISTED MYOMECTOMY N/A 07/22/2019   Procedure: XI ROBOTIC ASSISTED LAPAROSCOPIC RADICAL UPPER VAGINECTOMY, LEFT SALPINECTOMY, RIGHT SALPINGOOOPHERECTOMY;  Surgeon: Eloy Herring, MD;  Location: [facility];  Service: Gynecology;  Laterality: N/A;   VAGINAL HYSTERECTOMY N/A 03/11/2017   Procedure: HYSTERECTOMY VAGINAL WITH MORCELLATION;  Surgeon: Lorence Ozell CROME, MD;  Location: [facility];  Service: Gynecology;  Laterality: N/A;   Social History:  reports that she has never smoked. She has never used smokeless tobacco. She reports that she does not currently use alcohol. She reports that she does not use drugs.  Allergies  Allergen Reactions   Ceftriaxone  Anaphylaxis, Hives, Shortness Of Breath and Other (See Comments)    See progess note. Administration results in hives, throat pain, flushing and SOB.   Ciprofloxacin  Swelling and Other (See Comments)    Lips swell, tongue swells, face swells   Citalopram  Swelling and Other (See Comments)    Possible cause of pancytopenia. Swelling of tongue, face and throat   Lamotrigine  Swelling and Other (See Comments)    Possible cause of pancytopenia. Swelling of face, throat and tongue   Sulfa  Antibiotics Anaphylaxis   Tramadol Anaphylaxis, Shortness Of Breath and Swelling    Family History  Problem Relation Age of Onset   Alcohol abuse Father    Cancer Father    Breast cancer Neg Hx     Prior to Admission medications   Medication Sig Start Date End Date Taking? Authorizing Provider  acetaminophen  (TYLENOL ) 325 MG tablet Take 2 tablets (650 mg total) by mouth every 6 (six) hours as needed for mild pain (pain score 1-3), fever or headache. 09/17/23   Danton Lauraine LABOR, PA-C  BAZA PROTECT MOISTURE BARRIER 12 % CREA Apply 1 application  topically See admin instructions. Apply to reddened, diaper area after each cleansing    [provider]  cephALEXin  (KEFLEX ) 500 MG capsule Take 1 capsule (500 mg total) by  mouth 4 (four) times daily. Patient not taking: Reported on 06/09/2024 01/19/24   Odell Balls, PA-C  desvenlafaxine  (PRISTIQ ) 50 MG 24 hr tablet Take 50 mg by mouth every morning. 07/07/23   [provider]  divalproex  (DEPAKOTE ) 125 MG DR tablet Take 125 mg by mouth 3 (three) times daily.    [provider]  doxycycline  (VIBRAMYCIN ) 100 MG capsule Take 1 capsule (100 mg total) by mouth 2 (two) times daily. 05/02/24   Prosperi, Christian H, PA-C  estradiol  (VIVELLE -DOT) 0.075 MG/24HR Place 1 patch onto the skin 2 (two) times a week. 04/19/24   Anyanwu, Ugonna A, MD  gabapentin  (NEURONTIN ) 800 MG tablet Take 800 mg by mouth 3 (three) times daily. 01/11/22   [provider]  loperamide  (IMODIUM ) 2 MG capsule Take 1 capsule (2 mg total) by mouth every 8 (eight) hours as needed for diarrhea or loose stools. 09/29/23   Rai, Nydia POUR, MD  meclizine  (ANTIVERT ) 25 MG tablet Take 25 mg by mouth every 6 (six) hours as needed for dizziness.    [provider]  Multiple Vitamin (MULTIVITAMIN WITH MINERALS) TABS tablet Take 1 tablet  by mouth daily. Patient not taking: Reported on 06/09/2024 09/30/23   Davia Nydia POUR, MD  Multiple Vitamin (TAB-A-VITE) TABS Take 1 tablet by mouth daily with breakfast.    [provider]  nitrofurantoin , macrocrystal-monohydrate, (MACROBID ) 100 MG capsule Take 1 capsule (100 mg total) by mouth 2 (two) times daily. 06/29/24   Ozan, Jennifer, DO  nystatin  powder Apply 1 Application topically 2 (two) times daily as needed (for groin irritation).    [provider]  oxyCODONE  (ROXICODONE ) 15 MG immediate release tablet Take 15 mg by mouth every 4 (four) hours as needed for pain.    [provider]  polyethylene glycol (MIRALAX  / GLYCOLAX ) 17 g packet Take 17 g by mouth daily. 12/27/23   Perri DELENA Meliton Mickey., MD  QUEtiapine  (SEROQUEL ) 50 MG tablet Take 50 mg by mouth in the morning and at bedtime. Patient not taking: Reported on  06/09/2024    [provider]    Physical Exam: Vitals:   08/06/24 1700 08/06/24 1734 08/06/24 1900 08/06/24 2042  BP: (!) 157/73  139/86 (!) 157/73  Pulse: 94  93 93  Resp: 18  (!) 27 (!) 21  Temp:  98.2 F (36.8 C)  98.2 F (36.8 C)  TempSrc:  Oral  Oral  SpO2: 98%  98% 100%  Weight:       General:  Appears calm and comfortable and is in NAD. Obese.  Eyes:  PERRL, EOMI, normal lids, iris ENT:  grossly normal hearing, lips & tongue, mmm; poor dentition Neck:  no LAD, masses or thyromegaly; no carotid bruits, no JVD  Cardiovascular:  RRR, no m/r/g. 1+ LE edema  Respiratory:   crackles in bilateral bases. Right mid lung with crackles/decreased air movement. No wheezing. Normal effort.  Abdomen:  soft, NT, ND, NABS Back:   normal alignment, no CVAT Skin:  cut with scab on anterior left lower leg. Mild erythema,  Musculoskeletal:  grossly normal tone BUE/BLE, good ROM, no bony abnormality Lower extremity:   Limited foot exam with no ulcerations.  2+ distal pulses. Psychiatric:  grossly normal mood and affect, speech fluent and appropriate, AOx3 Neurologic:  CN 2-12 grossly intact, moves all extremities in coordinated fashion, sensation intact   Radiological Exams on Admission: Independently reviewed - see discussion in A/P where applicable  CT Angio Chest PE W and/or Wo Contrast Result Date: 08/06/2024 CLINICAL DATA:  Pulmonary embolism (PE) suspected, low to intermediate prob, positive D-dimer; Sepsis right sided abdominal pain, dysuria, sepsis. History of squamous cell carcinoma of the vagina. * Tracking Code: BO * EXAM: CT ANGIOGRAPHY CHEST CT ABDOMEN AND PELVIS WITH CONTRAST TECHNIQUE: Multidetector CT imaging of the chest was performed using the standard protocol during bolus administration of intravenous contrast. Multiplanar CT image reconstructions and MIPs were obtained to evaluate the vascular anatomy. Multidetector CT imaging of the abdomen and pelvis was performed  using the standard protocol during bolus administration of intravenous contrast. RADIATION DOSE REDUCTION: This exam was performed according to the departmental dose-optimization program which includes automated exposure control, adjustment of the mA and/or kV according to patient size and/or use of iterative reconstruction technique. CONTRAST:  OMNIPAQUE  IOHEXOL  350 MG/ML SOLN COMPARISON:  CT venogram abdomen and pelvis from 03/16/2024 and nuclear medicine PET scan from 09/15/2020. FINDINGS: CTA CHEST FINDINGS Cardiovascular: No evidence of embolism to the proximal subsegmental pulmonary artery level. Normal cardiac size. No pericardial effusion. No aortic aneurysm. There are coronary artery calcifications, in keeping with coronary artery disease. There are also  mild peripheral atherosclerotic vascular calcifications of thoracic aorta and its major branches. There is dilation of the main pulmonary trunk measuring up to 3.2 cm, which is nonspecific but can be seen with pulmonary artery hypertension. Mediastinum/Nodes: Visualized thyroid gland appears grossly unremarkable. No solid / cystic mediastinal masses. The esophagus is nondistended precluding optimal assessment. There are few mildly prominent mediastinal lymph nodes, which do not meet the size criteria for lymphadenopathy and though indeterminate most likely benign in etiology. No axillary or hilar lymphadenopathy by size criteria. Lungs/Pleura: The central tracheo-bronchial tree is patent. There are heterogeneous airspace opacities throughout the right lung without volume loss, compatible with multilobar pneumonia. Follow-up to clearing is recommended. There is mosaic attenuation of predominantly left lung, consistent with heterogeneous air trapping related to small airways disease. No mass or consolidation. No pleural effusion or pneumothorax. No suspicious lung nodules. There is a stable sub 6 mm nodule in the right lung lower lobe (series 18, image  11), which is unchanged since the prior study from 12/10/2022. Musculoskeletal: The visualized soft tissues of the chest wall are grossly unremarkable. No suspicious osseous lesions. There are mild multilevel degenerative changes in the visualized spine. Review of the MIP images confirms the above findings. CT ABDOMEN and PELVIS FINDINGS Hepatobiliary: The liver is normal in size. Non-cirrhotic configuration. No suspicious mass. No intrahepatic or extrahepatic bile duct dilation. Gallbladder is surgically absent. Pancreas: Unremarkable. No pancreatic ductal dilatation or surrounding inflammatory changes. Spleen: Within normal limits. No focal lesion. Adrenals/Urinary Tract: Adrenal glands are unremarkable. No suspicious renal mass. There is a partially exophytic 3.6 x 4.0 cm cyst arising from the right kidney upper pole. There is a 2-3 mm nonobstructing calculus in the right kidney upper pole. No other nephroureterolithiasis or obstructive uropathy on either side. Urinary bladder is under distended, precluding optimal assessment. However, no large mass or stones identified. No perivesical fat stranding. Stomach/Bowel: There is a small diverticulum arising from the second part of duodenum. No disproportionate dilation of the small or large bowel loops. No evidence of abnormal bowel wall thickening or inflammatory changes. The appendix is unremarkable. Vascular/Lymphatic: No ascites or pneumoperitoneum. No abdominal or pelvic lymphadenopathy, by size criteria. No aneurysmal dilation of the major abdominal arteries. There are mild peripheral atherosclerotic vascular calcifications of the aorta and its major branches. Reproductive: The uterus is surgically absent. No large adnexal mass. Other: There is a tiny fat containing umbilical hernia. The soft tissues and abdominal wall are otherwise unremarkable. Musculoskeletal: No suspicious osseous lesions. There are mild multilevel degenerative changes in the visualized  spine. Redemonstration of mild anterior wedging deformity of L2 vertebral body, similar to the prior study. There is a stable hemangioma in the posterior aspect of L1 vertebral body. Review of the MIP images confirms the above findings. IMPRESSION: 1. No embolism to the proximal subsegmental pulmonary artery level. 2. Multilobar pneumonia involving the right lung. Follow-up to clearing is recommended. 3. No acute inflammatory process identified within the abdomen or pelvis. 4. No metastatic disease identified within the chest, abdomen or pelvis. 5. Multiple other nonacute observations, as described above. Aortic Atherosclerosis (ICD10-I70.0). Electronically Signed   By: Ree Molt M.D.   On: 08/06/2024 13:56   CT ABDOMEN PELVIS W CONTRAST Result Date: 08/06/2024 CLINICAL DATA:  Pulmonary embolism (PE) suspected, low to intermediate prob, positive D-dimer; Sepsis right sided abdominal pain, dysuria, sepsis. History of squamous cell carcinoma of the vagina. * Tracking Code: BO * EXAM: CT ANGIOGRAPHY CHEST CT ABDOMEN AND PELVIS WITH CONTRAST  TECHNIQUE: Multidetector CT imaging of the chest was performed using the standard protocol during bolus administration of intravenous contrast. Multiplanar CT image reconstructions and MIPs were obtained to evaluate the vascular anatomy. Multidetector CT imaging of the abdomen and pelvis was performed using the standard protocol during bolus administration of intravenous contrast. RADIATION DOSE REDUCTION: This exam was performed according to the departmental dose-optimization program which includes automated exposure control, adjustment of the mA and/or kV according to patient size and/or use of iterative reconstruction technique. CONTRAST:  OMNIPAQUE  IOHEXOL  350 MG/ML SOLN COMPARISON:  CT venogram abdomen and pelvis from 03/16/2024 and nuclear medicine PET scan from 09/15/2020. FINDINGS: CTA CHEST FINDINGS Cardiovascular: No evidence of embolism to the proximal  subsegmental pulmonary artery level. Normal cardiac size. No pericardial effusion. No aortic aneurysm. There are coronary artery calcifications, in keeping with coronary artery disease. There are also mild peripheral atherosclerotic vascular calcifications of thoracic aorta and its major branches. There is dilation of the main pulmonary trunk measuring up to 3.2 cm, which is nonspecific but can be seen with pulmonary artery hypertension. Mediastinum/Nodes: Visualized thyroid gland appears grossly unremarkable. No solid / cystic mediastinal masses. The esophagus is nondistended precluding optimal assessment. There are few mildly prominent mediastinal lymph nodes, which do not meet the size criteria for lymphadenopathy and though indeterminate most likely benign in etiology. No axillary or hilar lymphadenopathy by size criteria. Lungs/Pleura: The central tracheo-bronchial tree is patent. There are heterogeneous airspace opacities throughout the right lung without volume loss, compatible with multilobar pneumonia. Follow-up to clearing is recommended. There is mosaic attenuation of predominantly left lung, consistent with heterogeneous air trapping related to small airways disease. No mass or consolidation. No pleural effusion or pneumothorax. No suspicious lung nodules. There is a stable sub 6 mm nodule in the right lung lower lobe (series 18, image 11), which is unchanged since the prior study from 12/10/2022. Musculoskeletal: The visualized soft tissues of the chest wall are grossly unremarkable. No suspicious osseous lesions. There are mild multilevel degenerative changes in the visualized spine. Review of the MIP images confirms the above findings. CT ABDOMEN and PELVIS FINDINGS Hepatobiliary: The liver is normal in size. Non-cirrhotic configuration. No suspicious mass. No intrahepatic or extrahepatic bile duct dilation. Gallbladder is surgically absent. Pancreas: Unremarkable. No pancreatic ductal dilatation or  surrounding inflammatory changes. Spleen: Within normal limits. No focal lesion. Adrenals/Urinary Tract: Adrenal glands are unremarkable. No suspicious renal mass. There is a partially exophytic 3.6 x 4.0 cm cyst arising from the right kidney upper pole. There is a 2-3 mm nonobstructing calculus in the right kidney upper pole. No other nephroureterolithiasis or obstructive uropathy on either side. Urinary bladder is under distended, precluding optimal assessment. However, no large mass or stones identified. No perivesical fat stranding. Stomach/Bowel: There is a small diverticulum arising from the second part of duodenum. No disproportionate dilation of the small or large bowel loops. No evidence of abnormal bowel wall thickening or inflammatory changes. The appendix is unremarkable. Vascular/Lymphatic: No ascites or pneumoperitoneum. No abdominal or pelvic lymphadenopathy, by size criteria. No aneurysmal dilation of the major abdominal arteries. There are mild peripheral atherosclerotic vascular calcifications of the aorta and its major branches. Reproductive: The uterus is surgically absent. No large adnexal mass. Other: There is a tiny fat containing umbilical hernia. The soft tissues and abdominal wall are otherwise unremarkable. Musculoskeletal: No suspicious osseous lesions. There are mild multilevel degenerative changes in the visualized spine. Redemonstration of mild anterior wedging deformity of L2 vertebral body, similar  to the prior study. There is a stable hemangioma in the posterior aspect of L1 vertebral body. Review of the MIP images confirms the above findings. IMPRESSION: 1. No embolism to the proximal subsegmental pulmonary artery level. 2. Multilobar pneumonia involving the right lung. Follow-up to clearing is recommended. 3. No acute inflammatory process identified within the abdomen or pelvis. 4. No metastatic disease identified within the chest, abdomen or pelvis. 5. Multiple other nonacute  observations, as described above. Aortic Atherosclerosis (ICD10-I70.0). Electronically Signed   By: Ree Molt M.D.   On: 08/06/2024 13:56   DG Chest Portable 1 View Result Date: 08/06/2024 CLINICAL DATA:  Shortness of breath, hypoxia, sepsis. EXAM: PORTABLE CHEST 1 VIEW COMPARISON:  01/19/2024 FINDINGS: Stable enlarged cardiac silhouette. Stable mild diffuse chronic interstitial prominence. Mild increase in prominence of the pulmonary vasculature. Interval mild patchy density in the right mid and lower lung zones. Unremarkable bones. IMPRESSION: 1. Interval mild pneumonia in the right mid and lower lung zones. 2. Interval mild pulmonary vascular congestion. 3. Stable cardiomegaly and mild chronic interstitial lung disease. Electronically Signed   By: Elspeth Bathe M.D.   On: 08/06/2024 09:00    EKG: Independently reviewed.  Sinus tachycardia with rate 127; nonspecific ST changes with no evidence of acute ischemia   Labs on Admission: I have personally reviewed the available labs and imaging studies at the time of the admission.  Pertinent labs:   wbc: 13.4,  pro bnp: 366,  lactic acid: 2.0>1.8  Assessment and Plan: Principal Problem:   Sepsis due to pneumonia and UTI Active Problems:   Acute on chronic diastolic CHF (congestive heart failure) (HCC)   Acute respiratory failure with hypoxia (HCC)   Chest pain   Chronic pain   Bipolar disorder (HCC)   Anxiety state   Vaginal cancer (HCC)   Sepsis secondary to UTI (HCC)    Assessment and Plan: * Sepsis due to pneumonia and UTI 58 year old presenting to ED with complaints of worsening dysuria/urgency and frequency x1 month as well as new onset dry cough, shortness of breat and fever found to be septic with fever to 101.5, tachycardia and leukocytosis secondary to multilobar pneumonia of right lung as well as UTI -admit to progressive -continue meropenem and linezolid due to allergies and hx of recurrent UTI (morganella morganii and  klebsiella). EDP discussed with pharmacy  -cultures pending -urinary antigens -sputum culture  -given 30mg /kg IVF in ED -lactic acidosis resolved (2.0>1.8)  -hold continued IVF with CHF exac -IS to bedside/flutter valve   -hx of complicated recurrent UTIs and radiation cystitis   Acute on chronic diastolic CHF (congestive heart failure) (HCC) Presenting with leg swelling, shortness of breath, CXR with pulmonary vascular congestion and elevated BNP requiring 2L oxygen consistent with acute on chronic diastolic CHF  -strict I/o and daily weights -last echo in 2016 with normal EF and grade 2 DD. ? Pulmonary Htn as well -repeat echo pending -give one dose of IV lasix  today and watch output since lasix  naive  -low sodium diet   Acute respiratory failure with hypoxia (HCC) Oxygen recorded at 89% on RA, requiring 2L oxygen via [place]. No baseline oxygen requirement  Likely secondary to right multilobar pneumonia + diastolic CHF exacerbation PE ruled out with CTA  Continue to treat #1/2 Wean as tolerated   Chest pain Atypical and reproducible over right anterior chest wall  Troponin wnl x 2 and no acute changes on EKG  Clinically indicative MSK etiology, but echo pending for  CHF exac  Heating pad only if afebrile  Conservative therapy   Chronic pain Continue gabapentin  800mg  TID Pmp website confirmed   Bipolar disorder (HCC) Med rec still not done. Waiting on fax from SNF   Anxiety state Follow up on MAR   Vaginal cancer [facility]) S/p radiation     Advance Care Planning:   Code Status: Full Code   Consults: none   DVT Prophylaxis: lovenox    Family Communication: none   Severity of Illness: The appropriate patient status for this patient is INPATIENT. Inpatient status is judged to be reasonable and necessary in order to provide the required intensity of service to ensure the patient's safety. The patient's presenting symptoms, physical exam findings, and initial radiographic  and laboratory data in the context of their chronic comorbidities is felt to place them at high risk for further clinical deterioration. Furthermore, it is not anticipated that the patient will be medically stable for discharge from the hospital within 2 midnights of admission.   * I certify that at the point of admission it is my clinical judgment that the patient will require inpatient hospital care spanning beyond 2 midnights from the point of admission due to high intensity of service, high risk for further deterioration and high frequency of surveillance required.*  Author: Isaiah Geralds, MD 08/06/2024 11:09 PM  For on call review [email or web address].

## 2024-08-06 NOTE — Assessment & Plan Note (Addendum)
 58 year old presenting to ED with complaints of worsening dysuria/urgency and frequency x1 month as well as new onset dry cough, shortness of breat and fever found to be septic with fever to 101.5, tachycardia and leukocytosis secondary to multilobar pneumonia of right lung as well as UTI -admit to progressive -continue meropenem and linezolid due to allergies and hx of recurrent UTI (morganella morganii and klebsiella). EDP discussed with pharmacy  -cultures pending -urinary antigens -sputum culture  -given 30mg /kg IVF in ED -lactic acidosis resolved (2.0>1.8)  -hold continued IVF with CHF exac -IS to bedside/flutter valve   -hx of complicated recurrent UTIs and radiation cystitis

## 2024-08-06 NOTE — Sepsis Progress Note (Signed)
 Elink will follow per sepsis protocol.

## 2024-08-06 NOTE — Assessment & Plan Note (Signed)
 Follow up on [facility]

## 2024-08-06 NOTE — Assessment & Plan Note (Signed)
 Oxygen recorded at 89% on RA, requiring 2L oxygen via [place]. No baseline oxygen requirement  Likely secondary to right multilobar pneumonia + diastolic CHF exacerbation PE ruled out with CTA  Continue to treat #1/2 Wean as tolerated

## 2024-08-06 NOTE — Assessment & Plan Note (Addendum)
 Presenting with leg swelling, shortness of breath, CXR with pulmonary vascular congestion and elevated BNP requiring 2L oxygen consistent with acute on chronic diastolic CHF  -strict I/o and daily weights -last echo in 2016 with normal EF and grade 2 DD. ? Pulmonary Htn as well -repeat echo pending -give one dose of IV lasix  today and watch output since lasix  naive  -low sodium diet

## 2024-08-07 ENCOUNTER — Inpatient Hospital Stay (HOSPITAL_COMMUNITY): Payer: MEDICAID

## 2024-08-07 DIAGNOSIS — I5033 Acute on chronic diastolic (congestive) heart failure: Secondary | ICD-10-CM

## 2024-08-07 DIAGNOSIS — J189 Pneumonia, unspecified organism: Secondary | ICD-10-CM | POA: Diagnosis not present

## 2024-08-07 DIAGNOSIS — A419 Sepsis, unspecified organism: Secondary | ICD-10-CM | POA: Diagnosis not present

## 2024-08-07 LAB — ECHOCARDIOGRAM COMPLETE
Area-P 1/2: 3.81 cm2
Height: 68 in
S' Lateral: 3.3 cm
Weight: 6123.5 [oz_av]

## 2024-08-07 LAB — BASIC METABOLIC PANEL WITH GFR
Anion gap: 7 (ref 5–15)
BUN: 10 mg/dL (ref 6–20)
CO2: 29 mmol/L (ref 22–32)
Calcium: 8.5 mg/dL — ABNORMAL LOW (ref 8.9–10.3)
Chloride: 103 mmol/L (ref 98–111)
Creatinine, Ser: 0.6 mg/dL (ref 0.44–1.00)
GFR, Estimated: >60 mL/min (ref >60)
Glucose, Bld: 94 mg/dL (ref 70–99)
Potassium: 4.2 mmol/L (ref 3.5–5.1)
Sodium: 138 mmol/L (ref 135–145)

## 2024-08-07 LAB — CBC
HCT: 34.2 % — ABNORMAL LOW (ref 36.0–46.0)
Hemoglobin: 10.8 g/dL — ABNORMAL LOW (ref 12.0–15.0)
MCH: 30.4 pg (ref 26.0–34.0)
MCHC: 31.6 g/dL (ref 30.0–36.0)
MCV: 96.3 fL (ref 80.0–100.0)
Platelets: 161 K/uL (ref 150–400)
RBC: 3.55 MIL/uL — ABNORMAL LOW (ref 3.87–5.11)
RDW: 13.4 % (ref 11.5–15.5)
WBC: 8.4 K/uL (ref 4.0–10.5)
nRBC: 0 % (ref 0.0–0.2)

## 2024-08-07 LAB — TSH: TSH: 1.87 u[IU]/mL (ref 0.350–4.500)

## 2024-08-07 LAB — PROCALCITONIN: Procalcitonin: 1.39 ng/mL

## 2024-08-07 MED ORDER — GUAIFENESIN ER 600 MG PO TB12
1200.0000 mg | ORAL_TABLET | Freq: Two times a day (BID) | ORAL | Status: DC
  Administered 2024-08-07 – 2024-08-10 (×7): 1200 mg via ORAL
  Filled 2024-08-07 (×8): qty 2

## 2024-08-07 MED ORDER — ALBUTEROL SULFATE (2.5 MG/3ML) 0.083% IN NEBU: 2.5000 mg | INHALATION_SOLUTION | RESPIRATORY_TRACT | Status: DC | PRN

## 2024-08-07 MED ORDER — PERFLUTREN LIPID MICROSPHERE
1.0000 mL | INTRAVENOUS | Status: AC | PRN
  Administered 2024-08-07: 2 mL via INTRAVENOUS

## 2024-08-07 MED ORDER — IPRATROPIUM-ALBUTEROL 0.5-2.5 (3) MG/3ML IN SOLN
3.0000 mL | Freq: Four times a day (QID) | RESPIRATORY_TRACT | Status: DC
  Administered 2024-08-07 (×2): 3 mL via RESPIRATORY_TRACT
  Filled 2024-08-07 (×2): qty 3

## 2024-08-07 MED ORDER — BUDESONIDE 0.25 MG/2ML IN SUSP
0.2500 mg | Freq: Two times a day (BID) | RESPIRATORY_TRACT | Status: DC
  Administered 2024-08-07 – 2024-08-10 (×8): 0.25 mg via RESPIRATORY_TRACT
  Filled 2024-08-07 (×8): qty 2

## 2024-08-07 MED ORDER — LIDOCAINE 5 % EX PTCH
1.0000 | MEDICATED_PATCH | Freq: Every day | CUTANEOUS | Status: DC
  Administered 2024-08-07 – 2024-08-10 (×4): 1 via TRANSDERMAL
  Filled 2024-08-07 (×4): qty 1

## 2024-08-07 MED ORDER — VENLAFAXINE HCL ER 75 MG PO CP24
75.0000 mg | ORAL_CAPSULE | Freq: Every day | ORAL | Status: DC
  Administered 2024-08-08 – 2024-08-10 (×3): 75 mg via ORAL
  Filled 2024-08-07 (×3): qty 1

## 2024-08-07 MED ORDER — QUETIAPINE FUMARATE 25 MG PO TABS
50.0000 mg | ORAL_TABLET | Freq: Every day | ORAL | Status: DC
Start: 2024-08-07 — End: 2024-08-11
  Administered 2024-08-07 – 2024-08-09 (×3): 50 mg via ORAL
  Filled 2024-08-07 (×3): qty 2

## 2024-08-07 MED ORDER — DIVALPROEX SODIUM 125 MG PO DR TAB
125.0000 mg | DELAYED_RELEASE_TABLET | Freq: Three times a day (TID) | ORAL | Status: DC
  Administered 2024-08-07 – 2024-08-10 (×11): 125 mg via ORAL
  Filled 2024-08-07 (×12): qty 1

## 2024-08-07 MED ORDER — ACETAMINOPHEN 500 MG PO TABS
500.0000 mg | ORAL_TABLET | Freq: Three times a day (TID) | ORAL | Status: DC
  Administered 2024-08-07 – 2024-08-10 (×11): 500 mg via ORAL
  Filled 2024-08-07 (×11): qty 1

## 2024-08-07 MED ORDER — ADULT MULTIVITAMIN W/MINERALS CH
1.0000 | ORAL_TABLET | Freq: Every day | ORAL | Status: DC
  Administered 2024-08-07 – 2024-08-10 (×4): 1 via ORAL
  Filled 2024-08-07 (×4): qty 1

## 2024-08-07 NOTE — Progress Notes (Signed)
 PROGRESS NOTE    Jordan [name]  FMW:[id number] DOB: 1966/05/12 DOA: 08/06/2024 PCP: Patient, No Pcp Per   Brief Narrative: 58 year old with past medical history significant for SCC of vagina, bipolar disorder, anemia, alcohol abuse who presents with lightheadedness, cough, fever dysuria.  She was also complaining of chest pain.  In the ED she was noted to be febrile, heart rate 129, lactic acid 2.0.  Chest x-ray interval mild pneumonia in the right mid and lower lung zones, mild pulmonary vascular congestion.  CT chest multilobar pneumonia.   Assessment & Plan:   Principal Problem:   Sepsis due to pneumonia and UTI Active Problems:   Acute on chronic diastolic CHF (congestive heart failure) (HCC)   Acute respiratory failure with hypoxia (HCC)   Chest pain   Chronic pain   Bipolar disorder (HCC)   Anxiety state   Vaginal cancer (HCC)   Sepsis secondary to UTI (HCC)   Sepsis secondary to PNA and UTI - Patient presents with fever, tachycardia, leukocytosis, CT showed multifocal pneumonia UA consistent with UTI - Continue linezolid and meropenem - Follow cultures -Received IV fluid Hold on IV due to concern for heart failure exacerbation  -Acute on chronic heart failure diastolic exacerbation - She received IV Lasix . Due to sepsis will hold further diuresis.  Monitor blood pressure.   Acute hypoxic respiratory failure Multifocal pneumonia -Respiratory panel positive for rhinovirus - Continue IV antibiotic -Will add DuoNeb  UTI: UA with more than 50 white blood cell. Follow urine culture. Continue meropenem  Chest pain Atypical, reproducible, also related to cough Echo reassurance troponin negative  Chronic pain: Continue gabapentin   Bipolar: Resume Depakote , Seroquel  and Effexor   Anxiety; resume Depakote   Vaginal cancer postradiation      Estimated body mass index is 58.19 kg/m as calculated from the following:   Height as of this encounter: 5' 8  (1.727 m).   Weight as of this encounter: 173.6 kg.   DVT prophylaxis: Lovenox  Code Status: Full code Family Communication: Care discussed with patient Disposition Plan:  Status is: Inpatient Remains inpatient appropriate because: management of Sepsis     Consultants:  none  Procedures:  ECHO  Antimicrobials:    Subjective: She is alert and conversant, appears anxious complaining of chest pain especially when she   Objective: Vitals:   08/06/24 2042 08/07/24 0025 08/07/24 0454 08/07/24 0457  BP: (!) 157/73 117/60  133/71  Pulse: 93 92  89  Resp: (!) 21 20  20   Temp: 98.2 F (36.8 C) 98.6 F (37 C)  97.8 F (36.6 C)  TempSrc: Oral Oral  Oral  SpO2: 100% 98%  100%  Weight:   (!) 173.6 kg   Height:   5' 8 (1.727 m)     Intake/Output Summary (Last 24 hours) at 08/07/2024 0733 Last data filed at 08/07/2024 0647 Gross per 24 hour  Intake 4582.42 ml  Output 950 ml  Net 3632.42 ml   Filed Weights   08/06/24 0804 08/07/24 0454  Weight: (!) 177.4 kg (!) 173.6 kg    Examination:  General exam: Appears calm and comfortable  Respiratory system: Bilateral rhonchi Cardiovascular system: S1 & S2 heard, RRR. No JVD, murmurs, rubs, gallops or clicks. No pedal edema. Gastrointestinal system: Abdomen is nondistended, soft and nontender. No organomegaly or masses felt. Normal bowel sounds heard. Central nervous system: Alert and oriented. No focal neurological deficits. Extremities: Symmetric 5 x 5 power.   Data Reviewed: I have personally reviewed following labs and imaging studies  CBC: Recent Labs  Lab 08/06/24 0835 08/06/24 0848 08/07/24 0540  WBC 13.4*  --  8.4  NEUTROABS 12.2*  --   --   HGB 12.1 12.2 10.8*  HCT 38.0 36.0 34.2*  MCV 93.6  --  96.3  PLT 194  --  161   Basic Metabolic Panel: Recent Labs  Lab 08/06/24 0835 08/06/24 0848 08/07/24 0540  NA 140 140 138  K 4.3 4.2 4.2  CL 105 104 103  CO2 25  --  29  GLUCOSE 151* 153* 94  BUN 13 11  10   CREATININE 0.73 0.70 0.60  CALCIUM 8.7*  --  8.5*   GFR: Estimated Creatinine Clearance: 133.6 mL/min (by C-G formula based on SCr of 0.6 mg/dL). Liver Function Tests: Recent Labs  Lab 08/06/24 0835  AST 36  ALT 28  ALKPHOS 139*  BILITOT 0.9  PROT 6.4*  ALBUMIN 3.6   Recent Labs  Lab 08/06/24 0835  LIPASE 23   No results for input(s): AMMONIA in the last 168 hours. Coagulation Profile: Recent Labs  Lab 08/06/24 0835  INR 1.0   Cardiac Enzymes: No results for input(s): CKTOTAL, CKMB, CKMBINDEX, TROPONINI in the last 168 hours. BNP (last 3 results) Recent Labs    08/06/24 0835  PROBNP 366.0*   HbA1C: No results for input(s): HGBA1C in the last 72 hours. CBG: No results for input(s): GLUCAP in the last 168 hours. Lipid Profile: No results for input(s): CHOL, HDL, LDLCALC, TRIG, CHOLHDL, LDLDIRECT in the last 72 hours. Thyroid Function Tests: Recent Labs    08/07/24 0540  TSH 1.870   Anemia Panel: No results for input(s): VITAMINB12, FOLATE, FERRITIN, TIBC, IRON , RETICCTPCT in the last 72 hours. Sepsis Labs: Recent Labs  Lab 08/06/24 0848 08/06/24 1113 08/06/24 1127 08/07/24 0540  PROCALCITON  --  0.80  --  1.39  LATICACIDVEN 2.0*  --  1.8  --     Recent Results (from the past 240 hours)  Resp panel by RT-PCR (RSV, Flu A&B, Covid) Anterior Nasal Swab     Status: None   Collection Time: 08/06/24  8:35 AM   Specimen: Anterior Nasal Swab  Result Value Ref Range Status   SARS Coronavirus 2 by RT PCR NEGATIVE NEGATIVE Final    Comment: (NOTE) SARS-CoV-2 target nucleic acids are NOT DETECTED.  The SARS-CoV-2 RNA is generally detectable in upper respiratory specimens during the acute phase of infection. The lowest concentration of SARS-CoV-2 viral copies this assay can detect is 138 copies/mL. A negative result does not preclude SARS-Cov-2 infection and should not be used as the sole basis for treatment  or other patient management decisions. A negative result may occur with  improper specimen collection/handling, submission of specimen other than nasopharyngeal swab, presence of viral mutation(s) within the areas targeted by this assay, and inadequate number of viral copies(<138 copies/mL). A negative result must be combined with clinical observations, patient history, and epidemiological information. The expected result is Negative.  Fact Sheet for Patients:  BloggerCourse.com  Fact Sheet for Healthcare Providers:  SeriousBroker.it  This test is no t yet approved or cleared by the [place]  FDA and  has been authorized for detection and/or diagnosis of SARS-CoV-2 by FDA under an Emergency Use Authorization (EUA). This EUA will remain  in effect (meaning this test can be used) for the duration of the COVID-19 declaration under Section 564(b)(1) of the Act, 21 U.S.C.section 360bbb-3(b)(1), unless the authorization is terminated  or revoked sooner.  Influenza A by PCR NEGATIVE NEGATIVE Final   Influenza B by PCR NEGATIVE NEGATIVE Final    Comment: (NOTE) The Xpert Xpress SARS-CoV-2/FLU/RSV plus assay is intended as an aid in the diagnosis of influenza from Nasopharyngeal swab specimens and should not be used as a sole basis for treatment. Nasal washings and aspirates are unacceptable for Xpert Xpress SARS-CoV-2/FLU/RSV testing.  Fact Sheet for Patients: BloggerCourse.com  Fact Sheet for Healthcare Providers: SeriousBroker.it  This test is not yet approved or cleared by the [place]  FDA and has been authorized for detection and/or diagnosis of SARS-CoV-2 by FDA under an Emergency Use Authorization (EUA). This EUA will remain in effect (meaning this test can be used) for the duration of the COVID-19 declaration under Section 564(b)(1) of the Act, 21 U.S.C. section  360bbb-3(b)(1), unless the authorization is terminated or revoked.     Resp Syncytial Virus by PCR NEGATIVE NEGATIVE Final    Comment: (NOTE) Fact Sheet for Patients: BloggerCourse.com  Fact Sheet for Healthcare Providers: SeriousBroker.it  This test is not yet approved or cleared by the [place]  FDA and has been authorized for detection and/or diagnosis of SARS-CoV-2 by FDA under an Emergency Use Authorization (EUA). This EUA will remain in effect (meaning this test can be used) for the duration of the COVID-19 declaration under Section 564(b)(1) of the Act, 21 U.S.C. section 360bbb-3(b)(1), unless the authorization is terminated or revoked.  Performed at [facility], [address], [place], [place] [address]   Respiratory (~20 pathogens) panel by PCR     Status: Abnormal   Collection Time: 08/06/24  8:35 AM   Specimen: Nasopharyngeal Swab; Respiratory  Result Value Ref Range Status   Adenovirus NOT DETECTED NOT DETECTED Final   Coronavirus 229E NOT DETECTED NOT DETECTED Final    Comment: (NOTE) The Coronavirus on the Respiratory Panel, DOES NOT test for the novel  Coronavirus (2019 nCoV)    Coronavirus HKU1 NOT DETECTED NOT DETECTED Final   Coronavirus NL63 NOT DETECTED NOT DETECTED Final   Coronavirus OC43 NOT DETECTED NOT DETECTED Final   Metapneumovirus NOT DETECTED NOT DETECTED Final   Rhinovirus / Enterovirus DETECTED (A) NOT DETECTED Final   Influenza A NOT DETECTED NOT DETECTED Final   Influenza B NOT DETECTED NOT DETECTED Final   Parainfluenza Virus 1 NOT DETECTED NOT DETECTED Final   Parainfluenza Virus 2 NOT DETECTED NOT DETECTED Final   Parainfluenza Virus 3 NOT DETECTED NOT DETECTED Final   Parainfluenza Virus 4 NOT DETECTED NOT DETECTED Final   Respiratory Syncytial Virus NOT DETECTED NOT DETECTED Final   Bordetella pertussis NOT DETECTED NOT DETECTED Final   Bordetella  Parapertussis NOT DETECTED NOT DETECTED Final   Chlamydophila pneumoniae NOT DETECTED NOT DETECTED Final   Mycoplasma pneumoniae NOT DETECTED NOT DETECTED Final    Comment: Performed at [facility], [address], [place], [place] [address]         Radiology Studies: CT Angio Chest PE W and/or Wo Contrast Result Date: 08/06/2024 CLINICAL DATA:  Pulmonary embolism (PE) suspected, low to intermediate prob, positive D-dimer; Sepsis right sided abdominal pain, dysuria, sepsis. History of squamous cell carcinoma of the vagina. * Tracking Code: BO * EXAM: CT ANGIOGRAPHY CHEST CT ABDOMEN AND PELVIS WITH CONTRAST TECHNIQUE: Multidetector CT imaging of the chest was performed using the standard protocol during bolus administration of intravenous contrast. Multiplanar CT image reconstructions and MIPs were obtained to evaluate the vascular anatomy. Multidetector CT imaging of the abdomen and pelvis  was performed using the standard protocol during bolus administration of intravenous contrast. RADIATION DOSE REDUCTION: This exam was performed according to the departmental dose-optimization program which includes automated exposure control, adjustment of the mA and/or kV according to patient size and/or use of iterative reconstruction technique. CONTRAST:  OMNIPAQUE  IOHEXOL  350 MG/ML SOLN COMPARISON:  CT venogram abdomen and pelvis from 03/16/2024 and nuclear medicine PET scan from 09/15/2020. FINDINGS: CTA CHEST FINDINGS Cardiovascular: No evidence of embolism to the proximal subsegmental pulmonary artery level. Normal cardiac size. No pericardial effusion. No aortic aneurysm. There are coronary artery calcifications, in keeping with coronary artery disease. There are also mild peripheral atherosclerotic vascular calcifications of thoracic aorta and its major branches. There is dilation of the main pulmonary trunk measuring up to 3.2 cm, which is nonspecific but can be seen with pulmonary artery  hypertension. Mediastinum/Nodes: Visualized thyroid gland appears grossly unremarkable. No solid / cystic mediastinal masses. The esophagus is nondistended precluding optimal assessment. There are few mildly prominent mediastinal lymph nodes, which do not meet the size criteria for lymphadenopathy and though indeterminate most likely benign in etiology. No axillary or hilar lymphadenopathy by size criteria. Lungs/Pleura: The central tracheo-bronchial tree is patent. There are heterogeneous airspace opacities throughout the right lung without volume loss, compatible with multilobar pneumonia. Follow-up to clearing is recommended. There is mosaic attenuation of predominantly left lung, consistent with heterogeneous air trapping related to small airways disease. No mass or consolidation. No pleural effusion or pneumothorax. No suspicious lung nodules. There is a stable sub 6 mm nodule in the right lung lower lobe (series 18, image 11), which is unchanged since the prior study from 12/10/2022. Musculoskeletal: The visualized soft tissues of the chest wall are grossly unremarkable. No suspicious osseous lesions. There are mild multilevel degenerative changes in the visualized spine. Review of the MIP images confirms the above findings. CT ABDOMEN and PELVIS FINDINGS Hepatobiliary: The liver is normal in size. Non-cirrhotic configuration. No suspicious mass. No intrahepatic or extrahepatic bile duct dilation. Gallbladder is surgically absent. Pancreas: Unremarkable. No pancreatic ductal dilatation or surrounding inflammatory changes. Spleen: Within normal limits. No focal lesion. Adrenals/Urinary Tract: Adrenal glands are unremarkable. No suspicious renal mass. There is a partially exophytic 3.6 x 4.0 cm cyst arising from the right kidney upper pole. There is a 2-3 mm nonobstructing calculus in the right kidney upper pole. No other nephroureterolithiasis or obstructive uropathy on either side. Urinary bladder is under  distended, precluding optimal assessment. However, no large mass or stones identified. No perivesical fat stranding. Stomach/Bowel: There is a small diverticulum arising from the second part of duodenum. No disproportionate dilation of the small or large bowel loops. No evidence of abnormal bowel wall thickening or inflammatory changes. The appendix is unremarkable. Vascular/Lymphatic: No ascites or pneumoperitoneum. No abdominal or pelvic lymphadenopathy, by size criteria. No aneurysmal dilation of the major abdominal arteries. There are mild peripheral atherosclerotic vascular calcifications of the aorta and its major branches. Reproductive: The uterus is surgically absent. No large adnexal mass. Other: There is a tiny fat containing umbilical hernia. The soft tissues and abdominal wall are otherwise unremarkable. Musculoskeletal: No suspicious osseous lesions. There are mild multilevel degenerative changes in the visualized spine. Redemonstration of mild anterior wedging deformity of L2 vertebral body, similar to the prior study. There is a stable hemangioma in the posterior aspect of L1 vertebral body. Review of the MIP images confirms the above findings. IMPRESSION: 1. No embolism to the proximal subsegmental pulmonary artery level. 2. Multilobar pneumonia  involving the right lung. Follow-up to clearing is recommended. 3. No acute inflammatory process identified within the abdomen or pelvis. 4. No metastatic disease identified within the chest, abdomen or pelvis. 5. Multiple other nonacute observations, as described above. Aortic Atherosclerosis (ICD10-I70.0). Electronically Signed   By: Ree Molt M.D.   On: 08/06/2024 13:56   CT ABDOMEN PELVIS W CONTRAST Result Date: 08/06/2024 CLINICAL DATA:  Pulmonary embolism (PE) suspected, low to intermediate prob, positive D-dimer; Sepsis right sided abdominal pain, dysuria, sepsis. History of squamous cell carcinoma of the vagina. * Tracking Code: BO * EXAM: CT  ANGIOGRAPHY CHEST CT ABDOMEN AND PELVIS WITH CONTRAST TECHNIQUE: Multidetector CT imaging of the chest was performed using the standard protocol during bolus administration of intravenous contrast. Multiplanar CT image reconstructions and MIPs were obtained to evaluate the vascular anatomy. Multidetector CT imaging of the abdomen and pelvis was performed using the standard protocol during bolus administration of intravenous contrast. RADIATION DOSE REDUCTION: This exam was performed according to the departmental dose-optimization program which includes automated exposure control, adjustment of the mA and/or kV according to patient size and/or use of iterative reconstruction technique. CONTRAST:  OMNIPAQUE  IOHEXOL  350 MG/ML SOLN COMPARISON:  CT venogram abdomen and pelvis from 03/16/2024 and nuclear medicine PET scan from 09/15/2020. FINDINGS: CTA CHEST FINDINGS Cardiovascular: No evidence of embolism to the proximal subsegmental pulmonary artery level. Normal cardiac size. No pericardial effusion. No aortic aneurysm. There are coronary artery calcifications, in keeping with coronary artery disease. There are also mild peripheral atherosclerotic vascular calcifications of thoracic aorta and its major branches. There is dilation of the main pulmonary trunk measuring up to 3.2 cm, which is nonspecific but can be seen with pulmonary artery hypertension. Mediastinum/Nodes: Visualized thyroid gland appears grossly unremarkable. No solid / cystic mediastinal masses. The esophagus is nondistended precluding optimal assessment. There are few mildly prominent mediastinal lymph nodes, which do not meet the size criteria for lymphadenopathy and though indeterminate most likely benign in etiology. No axillary or hilar lymphadenopathy by size criteria. Lungs/Pleura: The central tracheo-bronchial tree is patent. There are heterogeneous airspace opacities throughout the right lung without volume loss, compatible with  multilobar pneumonia. Follow-up to clearing is recommended. There is mosaic attenuation of predominantly left lung, consistent with heterogeneous air trapping related to small airways disease. No mass or consolidation. No pleural effusion or pneumothorax. No suspicious lung nodules. There is a stable sub 6 mm nodule in the right lung lower lobe (series 18, image 11), which is unchanged since the prior study from 12/10/2022. Musculoskeletal: The visualized soft tissues of the chest wall are grossly unremarkable. No suspicious osseous lesions. There are mild multilevel degenerative changes in the visualized spine. Review of the MIP images confirms the above findings. CT ABDOMEN and PELVIS FINDINGS Hepatobiliary: The liver is normal in size. Non-cirrhotic configuration. No suspicious mass. No intrahepatic or extrahepatic bile duct dilation. Gallbladder is surgically absent. Pancreas: Unremarkable. No pancreatic ductal dilatation or surrounding inflammatory changes. Spleen: Within normal limits. No focal lesion. Adrenals/Urinary Tract: Adrenal glands are unremarkable. No suspicious renal mass. There is a partially exophytic 3.6 x 4.0 cm cyst arising from the right kidney upper pole. There is a 2-3 mm nonobstructing calculus in the right kidney upper pole. No other nephroureterolithiasis or obstructive uropathy on either side. Urinary bladder is under distended, precluding optimal assessment. However, no large mass or stones identified. No perivesical fat stranding. Stomach/Bowel: There is a small diverticulum arising from the second part of duodenum. No disproportionate dilation of  the small or large bowel loops. No evidence of abnormal bowel wall thickening or inflammatory changes. The appendix is unremarkable. Vascular/Lymphatic: No ascites or pneumoperitoneum. No abdominal or pelvic lymphadenopathy, by size criteria. No aneurysmal dilation of the major abdominal arteries. There are mild peripheral atherosclerotic  vascular calcifications of the aorta and its major branches. Reproductive: The uterus is surgically absent. No large adnexal mass. Other: There is a tiny fat containing umbilical hernia. The soft tissues and abdominal wall are otherwise unremarkable. Musculoskeletal: No suspicious osseous lesions. There are mild multilevel degenerative changes in the visualized spine. Redemonstration of mild anterior wedging deformity of L2 vertebral body, similar to the prior study. There is a stable hemangioma in the posterior aspect of L1 vertebral body. Review of the MIP images confirms the above findings. IMPRESSION: 1. No embolism to the proximal subsegmental pulmonary artery level. 2. Multilobar pneumonia involving the right lung. Follow-up to clearing is recommended. 3. No acute inflammatory process identified within the abdomen or pelvis. 4. No metastatic disease identified within the chest, abdomen or pelvis. 5. Multiple other nonacute observations, as described above. Aortic Atherosclerosis (ICD10-I70.0). Electronically Signed   By: Ree Molt M.D.   On: 08/06/2024 13:56   DG Chest Portable 1 View Result Date: 08/06/2024 CLINICAL DATA:  Shortness of breath, hypoxia, sepsis. EXAM: PORTABLE CHEST 1 VIEW COMPARISON:  01/19/2024 FINDINGS: Stable enlarged cardiac silhouette. Stable mild diffuse chronic interstitial prominence. Mild increase in prominence of the pulmonary vasculature. Interval mild patchy density in the right mid and lower lung zones. Unremarkable bones. IMPRESSION: 1. Interval mild pneumonia in the right mid and lower lung zones. 2. Interval mild pulmonary vascular congestion. 3. Stable cardiomegaly and mild chronic interstitial lung disease. Electronically Signed   By: Elspeth Bathe M.D.   On: 08/06/2024 09:00        Scheduled Meds:  enoxaparin  (LOVENOX ) injection  80 mg Subcutaneous Q24H   gabapentin   800 mg Oral TID   Continuous Infusions:  linezolid (ZYVOX) IV Stopped (08/06/24 2317)    meropenem (MERREM) IV Stopped (08/07/24 0532)     LOS: 1 day    Time spent: 35 Minutes    Charman Blasco A Tyreanna Bisesi, MD [facility]   If 7PM-7AM, please contact night-coverage [email or web address]  08/07/2024, 7:33 AM

## 2024-08-07 NOTE — Plan of Care (Signed)

## 2024-08-07 NOTE — Plan of Care (Signed)

## 2024-08-07 NOTE — Consult Note (Signed)
 WOC Nurse Consult Note: Reason for Consult:  wound assessment Patient from SNF; bipolar, CHF, vulvar ca.  Wound type: Left hip;full thickness;linear ulceration, pink, clean, pale tissue 3 areas on the left buttock; 2 full thickness punctate ulcerations; 2 full thickness, 1 partial thickness. All are clean and pink; Does not present like pressure injury  Pressure Injury POA: No Measurement: each less than 1cm on the buttock, see nursing flow sheets for measurement of hip wound  Wound bed:see above  Drainage (amount, consistency, odor) see nursing flow sheets Periwound:intact  Dressing procedure/placement/frequency: Cleanse buttock and hip wounds with saline, pat dry. Cover or fill each wound with small piece of xeroform gauze, top with foam. OK to change every 3 days or PRN soilage.     Re consult if needed, will not follow at this time. Thanks  Gilmer Kaminsky [employer], RN,CWOCN, CNS, [employer] [phone]

## 2024-08-08 DIAGNOSIS — A419 Sepsis, unspecified organism: Secondary | ICD-10-CM | POA: Diagnosis not present

## 2024-08-08 DIAGNOSIS — J189 Pneumonia, unspecified organism: Secondary | ICD-10-CM | POA: Diagnosis not present

## 2024-08-08 MED ORDER — IPRATROPIUM-ALBUTEROL 0.5-2.5 (3) MG/3ML IN SOLN
3.0000 mL | Freq: Three times a day (TID) | RESPIRATORY_TRACT | Status: DC
  Administered 2024-08-08 – 2024-08-10 (×9): 3 mL via RESPIRATORY_TRACT
  Filled 2024-08-08 (×9): qty 3

## 2024-08-08 NOTE — Progress Notes (Signed)
 PROGRESS NOTE    Jordan [name]  FMW:[id number] DOB: May 20, 1966 DOA: 08/06/2024 PCP: Patient, No Pcp Per   Brief Narrative: 58 year old with past medical history significant for SCC of vagina, bipolar disorder, anemia, alcohol abuse who presents with lightheadedness, cough, fever dysuria.  She was also complaining of chest pain.  In the ED she was noted to be febrile, heart rate 129, lactic acid 2.0.  Chest x-ray interval mild pneumonia in the right mid and lower lung zones, mild pulmonary vascular congestion.  CT chest multilobar pneumonia.   Assessment & Plan:   Principal Problem:   Sepsis due to pneumonia and UTI Active Problems:   Acute on chronic diastolic CHF (congestive heart failure) (HCC)   Acute respiratory failure with hypoxia (HCC)   Chest pain   Chronic pain   Bipolar disorder (HCC)   Anxiety state   Vaginal cancer (HCC)   Sepsis secondary to UTI (HCC)   Sepsis secondary to PNA and UTI - Patient presents with fever, tachycardia, leukocytosis, CT showed multifocal pneumonia UA consistent with UTI - Continue linezolid and meropenem - Follow urine  cultures; growing E coli.  -Received IV fluid Hold on IV due to concern for heart failure exacerbation  -Acute on chronic heart failure diastolic exacerbation - She received IV Lasix . Due to sepsis will hold further diuresis.  Monitor blood pressure. Oxygen requirement decreased to 2 L ECHO normal EF>   Acute hypoxic respiratory failure Multifocal pneumonia -Respiratory panel positive for rhinovirus - Continue IV antibiotic -Continue with  DuoNeb  UTI: UA with more than 50 white blood cell. Follow urine culture. E coli Continue meropenem  Chest pain Atypical, reproducible, also related to cough Echo reassurance troponin negative Related to cough.   Chronic pain: Continue gabapentin   Bipolar: Resume Depakote , Seroquel  and Effexor   Anxiety; resume Depakote   Vaginal cancer  postradiation      Estimated body mass index is 55.98 kg/m as calculated from the following:   Height as of this encounter: 5' 8 (1.727 m).   Weight as of this encounter: 167 kg.   DVT prophylaxis: Lovenox  Code Status: Full code Family Communication: Care discussed with patient Disposition Plan:  Status is: Inpatient Remains inpatient appropriate because: management of Sepsis     Consultants:  none  Procedures:  ECHO  Antimicrobials:    Subjective: She is still complaining of cough and chest pain after coughing.  She was feeling dizzy this morning.    Objective: Vitals:   08/07/24 2009 08/07/24 2105 08/08/24 0408 08/08/24 0426  BP:  (!) 147/67 (!) 147/79   Pulse:  89 77   Resp:  20 17   Temp:  97.9 F (36.6 C) 97.8 F (36.6 C)   TempSrc:  Oral Oral   SpO2: 96% 100% 99%   Weight:    (!) 167 kg  Height:        Intake/Output Summary (Last 24 hours) at 08/08/2024 0733 Last data filed at 08/08/2024 0656 Gross per 24 hour  Intake 1748.2 ml  Output 3350 ml  Net -1601.8 ml   Filed Weights   08/06/24 0804 08/07/24 0454 08/08/24 0426  Weight: (!) 177.4 kg (!) 173.6 kg (!) 167 kg    Examination:  General exam: NAD Respiratory system: BL ronchus.  Cardiovascular system: S 1, S 2 rRRR Gastrointestinal system: BS present, soft, nt Central nervous system: alert, non focal.  Extremities: Symmetric 5 x 5 power.   Data Reviewed: I have personally reviewed following labs and imaging studies  CBC:  Recent Labs  Lab 08/06/24 0835 08/06/24 0848 08/07/24 0540  WBC 13.4*  --  8.4  NEUTROABS 12.2*  --   --   HGB 12.1 12.2 10.8*  HCT 38.0 36.0 34.2*  MCV 93.6  --  96.3  PLT 194  --  161   Basic Metabolic Panel: Recent Labs  Lab 08/06/24 0835 08/06/24 0848 08/07/24 0540  NA 140 140 138  K 4.3 4.2 4.2  CL 105 104 103  CO2 25  --  29  GLUCOSE 151* 153* 94  BUN 13 11 10   CREATININE 0.73 0.70 0.60  CALCIUM 8.7*  --  8.5*   GFR: Estimated  Creatinine Clearance: 130.3 mL/min (by C-G formula based on SCr of 0.6 mg/dL). Liver Function Tests: Recent Labs  Lab 08/06/24 0835  AST 36  ALT 28  ALKPHOS 139*  BILITOT 0.9  PROT 6.4*  ALBUMIN 3.6   Recent Labs  Lab 08/06/24 0835  LIPASE 23   No results for input(s): AMMONIA in the last 168 hours. Coagulation Profile: Recent Labs  Lab 08/06/24 0835  INR 1.0   Cardiac Enzymes: No results for input(s): CKTOTAL, CKMB, CKMBINDEX, TROPONINI in the last 168 hours. BNP (last 3 results) Recent Labs    08/06/24 0835  PROBNP 366.0*   HbA1C: No results for input(s): HGBA1C in the last 72 hours. CBG: No results for input(s): GLUCAP in the last 168 hours. Lipid Profile: No results for input(s): CHOL, HDL, LDLCALC, TRIG, CHOLHDL, LDLDIRECT in the last 72 hours. Thyroid Function Tests: Recent Labs    08/07/24 0540  TSH 1.870   Anemia Panel: No results for input(s): VITAMINB12, FOLATE, FERRITIN, TIBC, IRON , RETICCTPCT in the last 72 hours. Sepsis Labs: Recent Labs  Lab 08/06/24 0848 08/06/24 1113 08/06/24 1127 08/07/24 0540  PROCALCITON  --  0.80  --  1.39  LATICACIDVEN 2.0*  --  1.8  --     Recent Results (from the past 240 hours)  Resp panel by RT-PCR (RSV, Flu A&B, Covid) Anterior Nasal Swab     Status: None   Collection Time: 08/06/24  8:35 AM   Specimen: Anterior Nasal Swab  Result Value Ref Range Status   SARS Coronavirus 2 by RT PCR NEGATIVE NEGATIVE Final    Comment: (NOTE) SARS-CoV-2 target nucleic acids are NOT DETECTED.  The SARS-CoV-2 RNA is generally detectable in upper respiratory specimens during the acute phase of infection. The lowest concentration of SARS-CoV-2 viral copies this assay can detect is 138 copies/mL. A negative result does not preclude SARS-Cov-2 infection and should not be used as the sole basis for treatment or other patient management decisions. A negative result may occur with  improper  specimen collection/handling, submission of specimen other than nasopharyngeal swab, presence of viral mutation(s) within the areas targeted by this assay, and inadequate number of viral copies(<138 copies/mL). A negative result must be combined with clinical observations, patient history, and epidemiological information. The expected result is Negative.  Fact Sheet for Patients:  BloggerCourse.com  Fact Sheet for Healthcare Providers:  SeriousBroker.it  This test is no t yet approved or cleared by the [place]  FDA and  has been authorized for detection and/or diagnosis of SARS-CoV-2 by FDA under an Emergency Use Authorization (EUA). This EUA will remain  in effect (meaning this test can be used) for the duration of the COVID-19 declaration under Section 564(b)(1) of the Act, 21 U.S.C.section 360bbb-3(b)(1), unless the authorization is terminated  or revoked sooner.  Influenza A by PCR NEGATIVE NEGATIVE Final   Influenza B by PCR NEGATIVE NEGATIVE Final    Comment: (NOTE) The Xpert Xpress SARS-CoV-2/FLU/RSV plus assay is intended as an aid in the diagnosis of influenza from Nasopharyngeal swab specimens and should not be used as a sole basis for treatment. Nasal washings and aspirates are unacceptable for Xpert Xpress SARS-CoV-2/FLU/RSV testing.  Fact Sheet for Patients: BloggerCourse.com  Fact Sheet for Healthcare Providers: SeriousBroker.it  This test is not yet approved or cleared by the [place]  FDA and has been authorized for detection and/or diagnosis of SARS-CoV-2 by FDA under an Emergency Use Authorization (EUA). This EUA will remain in effect (meaning this test can be used) for the duration of the COVID-19 declaration under Section 564(b)(1) of the Act, 21 U.S.C. section 360bbb-3(b)(1), unless the authorization is terminated or revoked.     Resp  Syncytial Virus by PCR NEGATIVE NEGATIVE Final    Comment: (NOTE) Fact Sheet for Patients: BloggerCourse.com  Fact Sheet for Healthcare Providers: SeriousBroker.it  This test is not yet approved or cleared by the [place]  FDA and has been authorized for detection and/or diagnosis of SARS-CoV-2 by FDA under an Emergency Use Authorization (EUA). This EUA will remain in effect (meaning this test can be used) for the duration of the COVID-19 declaration under Section 564(b)(1) of the Act, 21 U.S.C. section 360bbb-3(b)(1), unless the authorization is terminated or revoked.  Performed at [facility], [address], [place], [place] [address]   Blood Culture (routine x 2)     Status: None (Preliminary result)   Collection Time: 08/06/24  8:35 AM   Specimen: BLOOD LEFT HAND  Result Value Ref Range Status   Specimen Description   Final    BLOOD LEFT HAND Performed at [facility], [address] [place], [place] [address]    Special Requests   Final    BOTTLES DRAWN AEROBIC AND ANAEROBIC Blood Culture results may not be optimal due to an inadequate volume of blood received in culture bottles Performed at [facility], [address], [place], [place] [address]    Culture   Final    NO GROWTH < 24 HOURS Performed at [facility], [address] [place], [place] [address]    Report Status PENDING  Incomplete  Respiratory (~20 pathogens) panel by PCR     Status: Abnormal   Collection Time: 08/06/24  8:35 AM   Specimen: Nasopharyngeal Swab; Respiratory  Result Value Ref Range Status   Adenovirus NOT DETECTED NOT DETECTED Final   Coronavirus 229E NOT DETECTED NOT DETECTED Final    Comment: (NOTE) The Coronavirus on the Respiratory Panel, DOES NOT test for the novel  Coronavirus (2019 nCoV)    Coronavirus HKU1 NOT DETECTED NOT DETECTED Final   Coronavirus NL63 NOT DETECTED NOT  DETECTED Final   Coronavirus OC43 NOT DETECTED NOT DETECTED Final   Metapneumovirus NOT DETECTED NOT DETECTED Final   Rhinovirus / Enterovirus DETECTED (A) NOT DETECTED Final   Influenza A NOT DETECTED NOT DETECTED Final   Influenza B NOT DETECTED NOT DETECTED Final   Parainfluenza Virus 1 NOT DETECTED NOT DETECTED Final   Parainfluenza Virus 2 NOT DETECTED NOT DETECTED Final   Parainfluenza Virus 3 NOT DETECTED NOT DETECTED Final   Parainfluenza Virus 4 NOT DETECTED NOT DETECTED Final   Respiratory Syncytial Virus NOT DETECTED NOT DETECTED Final   Bordetella pertussis NOT DETECTED NOT DETECTED Final   Bordetella Parapertussis NOT  DETECTED NOT DETECTED Final   Chlamydophila pneumoniae NOT DETECTED NOT DETECTED Final   Mycoplasma pneumoniae NOT DETECTED NOT DETECTED Final    Comment: Performed at [facility], [address], [place], [place] [address]  Blood Culture (routine x 2)     Status: None (Preliminary result)   Collection Time: 08/06/24  8:49 AM   Specimen: BLOOD RIGHT HAND  Result Value Ref Range Status   Specimen Description   Final    BLOOD RIGHT HAND Performed at [facility], [address] [place], [place] [address]    Special Requests   Final    BOTTLES DRAWN AEROBIC AND ANAEROBIC Blood Culture results may not be optimal due to an inadequate volume of blood received in culture bottles Performed at [facility], [address], [place], [place] [address]    Culture   Final    NO GROWTH < 24 HOURS Performed at [facility], [address] [place], [place] [address]    Report Status PENDING  Incomplete         Radiology Studies: ECHOCARDIOGRAM COMPLETE Result Date: 08/07/2024    ECHOCARDIOGRAM REPORT   Patient Name:   LINDSAY STRAKA Date of Exam: 08/07/2024 Medical Rec #:  [id number]     Height:       68.0 in Accession #:    [id number]    Weight:       382.7 lb Date of Birth:  Feb 11, 1966      BSA:          2.693 m Patient Age:     58 years      BP:           133/71 mmHg Patient Gender: F             HR:           80 bpm. Exam Location:  Inpatient Procedure: 2D Echo and Intracardiac Opacification Agent (Both Spectral and Color            Flow Doppler were utilized during procedure). Indications:    CHF  History:        Patient has prior history of Echocardiogram examinations.  Sonographer:    Charmaine Gaskins Referring Phys: [id number] ALLISON WOLFE  Sonographer Comments: Technically challenging study due to limited acoustic windows and Technically difficult study due to poor echo windows. Image acquisition challenging due to patient body habitus. IMPRESSIONS  1. Left ventricular ejection fraction, by estimation, is 55 to 60%. The left ventricle has normal function. The left ventricle has no regional wall motion abnormalities. Left ventricular diastolic parameters were normal.  2. Right ventricular systolic function is normal. The right ventricular size is normal.  3. Left atrial size was mildly dilated.  4. The mitral valve is abnormal. Trivial mitral valve regurgitation. No evidence of mitral stenosis.  5. The aortic valve is tricuspid. Aortic valve regurgitation is not visualized. No aortic stenosis is present.  6. The inferior vena cava is normal in size with greater than 50% respiratory variability, suggesting right atrial pressure of 3 mmHg. FINDINGS  Left Ventricle: Left ventricular ejection fraction, by estimation, is 55 to 60%. The left ventricle has normal function. The left ventricle has no regional wall motion abnormalities. Definity contrast agent was given IV to delineate the left ventricular  endocardial borders. Strain was performed and the global longitudinal strain is indeterminate. The left ventricular internal cavity size was normal in size. There is no left ventricular  hypertrophy. Left ventricular diastolic parameters were normal. Right Ventricle: The right ventricular size is normal. No increase in right ventricular wall  thickness. Right ventricular systolic function is normal. Left Atrium: Left atrial size was mildly dilated. Right Atrium: Right atrial size was normal in size. Pericardium: There is no evidence of pericardial effusion. Mitral Valve: The mitral valve is abnormal. There is mild thickening of the mitral valve leaflet(s). There is mild calcification of the mitral valve leaflet(s). Trivial mitral valve regurgitation. No evidence of mitral valve stenosis. Tricuspid Valve: The tricuspid valve is normal in structure. Tricuspid valve regurgitation is not demonstrated. No evidence of tricuspid stenosis. Aortic Valve: The aortic valve is tricuspid. Aortic valve regurgitation is not visualized. No aortic stenosis is present. Pulmonic Valve: The pulmonic valve was normal in structure. Pulmonic valve regurgitation is trivial. No evidence of pulmonic stenosis. Aorta: The aortic root is normal in size and structure. Venous: The inferior vena cava is normal in size with greater than 50% respiratory variability, suggesting right atrial pressure of 3 mmHg. IAS/Shunts: No atrial level shunt detected by color flow Doppler. Additional Comments: 3D was performed not requiring image post processing on an independent workstation and was indeterminate.  LEFT VENTRICLE PLAX 2D LVIDd:         4.60 cm   Diastology LVIDs:         3.30 cm   LV e' medial:    7.83 cm/s LV PW:         1.00 cm   LV E/e' medial:  14.2 LV IVS:        1.10 cm   LV e' lateral:   10.70 cm/s LVOT diam:     2.30 cm   LV E/e' lateral: 10.4 LVOT Area:     4.15 cm  RIGHT VENTRICLE RV S prime:     9.79 cm/s LEFT ATRIUM           Index LA diam:      3.90 cm 1.45 cm/m LA Vol (A4C): 72.2 ml 26.81 ml/m   AORTA Ao Root diam: 3.70 cm Ao Asc diam:  3.30 cm MITRAL VALVE MV Area (PHT): 3.81 cm     SHUNTS MV Decel Time: 199 msec     Systemic Diam: 2.30 cm MV E velocity: 111.00 cm/s MV A velocity: 96.70 cm/s MV E/A ratio:  1.15 Maude Emmer MD Electronically signed by Maude Emmer MD  Signature Date/Time: 08/07/2024/9:57:57 AM    Final    CT Angio Chest PE W and/or Wo Contrast Result Date: 08/06/2024 CLINICAL DATA:  Pulmonary embolism (PE) suspected, low to intermediate prob, positive D-dimer; Sepsis right sided abdominal pain, dysuria, sepsis. History of squamous cell carcinoma of the vagina. * Tracking Code: BO * EXAM: CT ANGIOGRAPHY CHEST CT ABDOMEN AND PELVIS WITH CONTRAST TECHNIQUE: Multidetector CT imaging of the chest was performed using the standard protocol during bolus administration of intravenous contrast. Multiplanar CT image reconstructions and MIPs were obtained to evaluate the vascular anatomy. Multidetector CT imaging of the abdomen and pelvis was performed using the standard protocol during bolus administration of intravenous contrast. RADIATION DOSE REDUCTION: This exam was performed according to the departmental dose-optimization program which includes automated exposure control, adjustment of the mA and/or kV according to patient size and/or use of iterative reconstruction technique. CONTRAST:  OMNIPAQUE  IOHEXOL  350 MG/ML SOLN COMPARISON:  CT venogram abdomen and pelvis from 03/16/2024 and nuclear medicine PET scan from 09/15/2020. FINDINGS: CTA CHEST FINDINGS Cardiovascular: No evidence of embolism to the proximal  subsegmental pulmonary artery level. Normal cardiac size. No pericardial effusion. No aortic aneurysm. There are coronary artery calcifications, in keeping with coronary artery disease. There are also mild peripheral atherosclerotic vascular calcifications of thoracic aorta and its major branches. There is dilation of the main pulmonary trunk measuring up to 3.2 cm, which is nonspecific but can be seen with pulmonary artery hypertension. Mediastinum/Nodes: Visualized thyroid gland appears grossly unremarkable. No solid / cystic mediastinal masses. The esophagus is nondistended precluding optimal assessment. There are few mildly prominent mediastinal lymph  nodes, which do not meet the size criteria for lymphadenopathy and though indeterminate most likely benign in etiology. No axillary or hilar lymphadenopathy by size criteria. Lungs/Pleura: The central tracheo-bronchial tree is patent. There are heterogeneous airspace opacities throughout the right lung without volume loss, compatible with multilobar pneumonia. Follow-up to clearing is recommended. There is mosaic attenuation of predominantly left lung, consistent with heterogeneous air trapping related to small airways disease. No mass or consolidation. No pleural effusion or pneumothorax. No suspicious lung nodules. There is a stable sub 6 mm nodule in the right lung lower lobe (series 18, image 11), which is unchanged since the prior study from 12/10/2022. Musculoskeletal: The visualized soft tissues of the chest wall are grossly unremarkable. No suspicious osseous lesions. There are mild multilevel degenerative changes in the visualized spine. Review of the MIP images confirms the above findings. CT ABDOMEN and PELVIS FINDINGS Hepatobiliary: The liver is normal in size. Non-cirrhotic configuration. No suspicious mass. No intrahepatic or extrahepatic bile duct dilation. Gallbladder is surgically absent. Pancreas: Unremarkable. No pancreatic ductal dilatation or surrounding inflammatory changes. Spleen: Within normal limits. No focal lesion. Adrenals/Urinary Tract: Adrenal glands are unremarkable. No suspicious renal mass. There is a partially exophytic 3.6 x 4.0 cm cyst arising from the right kidney upper pole. There is a 2-3 mm nonobstructing calculus in the right kidney upper pole. No other nephroureterolithiasis or obstructive uropathy on either side. Urinary bladder is under distended, precluding optimal assessment. However, no large mass or stones identified. No perivesical fat stranding. Stomach/Bowel: There is a small diverticulum arising from the second part of duodenum. No disproportionate dilation of the  small or large bowel loops. No evidence of abnormal bowel wall thickening or inflammatory changes. The appendix is unremarkable. Vascular/Lymphatic: No ascites or pneumoperitoneum. No abdominal or pelvic lymphadenopathy, by size criteria. No aneurysmal dilation of the major abdominal arteries. There are mild peripheral atherosclerotic vascular calcifications of the aorta and its major branches. Reproductive: The uterus is surgically absent. No large adnexal mass. Other: There is a tiny fat containing umbilical hernia. The soft tissues and abdominal wall are otherwise unremarkable. Musculoskeletal: No suspicious osseous lesions. There are mild multilevel degenerative changes in the visualized spine. Redemonstration of mild anterior wedging deformity of L2 vertebral body, similar to the prior study. There is a stable hemangioma in the posterior aspect of L1 vertebral body. Review of the MIP images confirms the above findings. IMPRESSION: 1. No embolism to the proximal subsegmental pulmonary artery level. 2. Multilobar pneumonia involving the right lung. Follow-up to clearing is recommended. 3. No acute inflammatory process identified within the abdomen or pelvis. 4. No metastatic disease identified within the chest, abdomen or pelvis. 5. Multiple other nonacute observations, as described above. Aortic Atherosclerosis (ICD10-I70.0). Electronically Signed   By: Ree Molt M.D.   On: 08/06/2024 13:56   CT ABDOMEN PELVIS W CONTRAST Result Date: 08/06/2024 CLINICAL DATA:  Pulmonary embolism (PE) suspected, low to intermediate prob, positive D-dimer; Sepsis right sided  abdominal pain, dysuria, sepsis. History of squamous cell carcinoma of the vagina. * Tracking Code: BO * EXAM: CT ANGIOGRAPHY CHEST CT ABDOMEN AND PELVIS WITH CONTRAST TECHNIQUE: Multidetector CT imaging of the chest was performed using the standard protocol during bolus administration of intravenous contrast. Multiplanar CT image reconstructions and  MIPs were obtained to evaluate the vascular anatomy. Multidetector CT imaging of the abdomen and pelvis was performed using the standard protocol during bolus administration of intravenous contrast. RADIATION DOSE REDUCTION: This exam was performed according to the departmental dose-optimization program which includes automated exposure control, adjustment of the mA and/or kV according to patient size and/or use of iterative reconstruction technique. CONTRAST:  OMNIPAQUE  IOHEXOL  350 MG/ML SOLN COMPARISON:  CT venogram abdomen and pelvis from 03/16/2024 and nuclear medicine PET scan from 09/15/2020. FINDINGS: CTA CHEST FINDINGS Cardiovascular: No evidence of embolism to the proximal subsegmental pulmonary artery level. Normal cardiac size. No pericardial effusion. No aortic aneurysm. There are coronary artery calcifications, in keeping with coronary artery disease. There are also mild peripheral atherosclerotic vascular calcifications of thoracic aorta and its major branches. There is dilation of the main pulmonary trunk measuring up to 3.2 cm, which is nonspecific but can be seen with pulmonary artery hypertension. Mediastinum/Nodes: Visualized thyroid gland appears grossly unremarkable. No solid / cystic mediastinal masses. The esophagus is nondistended precluding optimal assessment. There are few mildly prominent mediastinal lymph nodes, which do not meet the size criteria for lymphadenopathy and though indeterminate most likely benign in etiology. No axillary or hilar lymphadenopathy by size criteria. Lungs/Pleura: The central tracheo-bronchial tree is patent. There are heterogeneous airspace opacities throughout the right lung without volume loss, compatible with multilobar pneumonia. Follow-up to clearing is recommended. There is mosaic attenuation of predominantly left lung, consistent with heterogeneous air trapping related to small airways disease. No mass or consolidation. No pleural effusion or  pneumothorax. No suspicious lung nodules. There is a stable sub 6 mm nodule in the right lung lower lobe (series 18, image 11), which is unchanged since the prior study from 12/10/2022. Musculoskeletal: The visualized soft tissues of the chest wall are grossly unremarkable. No suspicious osseous lesions. There are mild multilevel degenerative changes in the visualized spine. Review of the MIP images confirms the above findings. CT ABDOMEN and PELVIS FINDINGS Hepatobiliary: The liver is normal in size. Non-cirrhotic configuration. No suspicious mass. No intrahepatic or extrahepatic bile duct dilation. Gallbladder is surgically absent. Pancreas: Unremarkable. No pancreatic ductal dilatation or surrounding inflammatory changes. Spleen: Within normal limits. No focal lesion. Adrenals/Urinary Tract: Adrenal glands are unremarkable. No suspicious renal mass. There is a partially exophytic 3.6 x 4.0 cm cyst arising from the right kidney upper pole. There is a 2-3 mm nonobstructing calculus in the right kidney upper pole. No other nephroureterolithiasis or obstructive uropathy on either side. Urinary bladder is under distended, precluding optimal assessment. However, no large mass or stones identified. No perivesical fat stranding. Stomach/Bowel: There is a small diverticulum arising from the second part of duodenum. No disproportionate dilation of the small or large bowel loops. No evidence of abnormal bowel wall thickening or inflammatory changes. The appendix is unremarkable. Vascular/Lymphatic: No ascites or pneumoperitoneum. No abdominal or pelvic lymphadenopathy, by size criteria. No aneurysmal dilation of the major abdominal arteries. There are mild peripheral atherosclerotic vascular calcifications of the aorta and its major branches. Reproductive: The uterus is surgically absent. No large adnexal mass. Other: There is a tiny fat containing umbilical hernia. The soft tissues and abdominal wall are otherwise  unremarkable. Musculoskeletal: No suspicious osseous lesions. There are mild multilevel degenerative changes in the visualized spine. Redemonstration of mild anterior wedging deformity of L2 vertebral body, similar to the prior study. There is a stable hemangioma in the posterior aspect of L1 vertebral body. Review of the MIP images confirms the above findings. IMPRESSION: 1. No embolism to the proximal subsegmental pulmonary artery level. 2. Multilobar pneumonia involving the right lung. Follow-up to clearing is recommended. 3. No acute inflammatory process identified within the abdomen or pelvis. 4. No metastatic disease identified within the chest, abdomen or pelvis. 5. Multiple other nonacute observations, as described above. Aortic Atherosclerosis (ICD10-I70.0). Electronically Signed   By: Ree Molt M.D.   On: 08/06/2024 13:56   DG Chest Portable 1 View Result Date: 08/06/2024 CLINICAL DATA:  Shortness of breath, hypoxia, sepsis. EXAM: PORTABLE CHEST 1 VIEW COMPARISON:  01/19/2024 FINDINGS: Stable enlarged cardiac silhouette. Stable mild diffuse chronic interstitial prominence. Mild increase in prominence of the pulmonary vasculature. Interval mild patchy density in the right mid and lower lung zones. Unremarkable bones. IMPRESSION: 1. Interval mild pneumonia in the right mid and lower lung zones. 2. Interval mild pulmonary vascular congestion. 3. Stable cardiomegaly and mild chronic interstitial lung disease. Electronically Signed   By: Elspeth Bathe M.D.   On: 08/06/2024 09:00        Scheduled Meds:  acetaminophen   500 mg Oral TID   budesonide (PULMICORT) nebulizer solution  0.25 mg Nebulization BID   divalproex   125 mg Oral TID   enoxaparin  (LOVENOX ) injection  80 mg Subcutaneous Q24H   gabapentin   800 mg Oral TID   guaiFENesin   1,200 mg Oral BID   ipratropium-albuterol   3 mL Nebulization TID   lidocaine   1 patch Transdermal Daily   multivitamin with minerals  1 tablet Oral Daily    QUEtiapine   50 mg Oral QHS   venlafaxine  XR  75 mg Oral Q breakfast   Continuous Infusions:  linezolid (ZYVOX) IV 600 mg (08/07/24 2212)   meropenem (MERREM) IV 1 g (08/08/24 0538)     LOS: 2 days    Time spent: 35 Minutes    Alcario Tinkey A Temple Ewart, MD [facility]   If 7PM-7AM, please contact night-coverage [email or web address]  08/08/2024, 7:33 AM

## 2024-08-08 NOTE — Evaluation (Signed)
 Physical Therapy Evaluation Patient Details Name: Jordan [name] MRN: [id number] DOB: 1966/03/02 Today's Date: 08/08/2024  History of Present Illness  Pt is 58 yo female admitted on 08/06/24 with sepsis due to pne and UTI.  Pt has c/o lightheadedness, dizziness, and chest pain.  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 At baseline, pt was residing at LTC facility.  She was ambulatory in room via furniture surfing.  Reports unable to get new RW.  Today, pt anxious with movement and reports R chest pain and dizziness.  She required frequent cues for relaxation.  She required CGA for transfers and only able to stand briefly and take steps to [facility] - limited by pain, anxiety, dizziness.  Orthostatic BP were negative in supine/sit, unable to get in standing.  Low suspicion for vestibular cause of dizziness based on hx but will continue to monitor.  Pt currently with functional limitations due to the deficits listed below (see PT Problem List). Pt will benefit from acute skilled PT to increase their independence and safety with mobility to allow discharge.  Recommend Patient will benefit from continued inpatient follow up therapy, <3 hours/day at d/c.          If plan is discharge home, recommend the following: A little help with walking and/or transfers;A little help with bathing/dressing/bathroom   Can travel by private vehicle   No    Equipment Recommendations Rolling walker (2 wheels)  Recommendations for Other Services       Functional Status Assessment Patient has had a recent decline in their functional status and demonstrates the ability to make significant improvements in function in a reasonable and predictable amount of time.     Precautions / Restrictions Precautions Precautions: Fall      Mobility  Bed Mobility Overal bed mobility: Needs  Assistance Bed Mobility: Supine to Sit, Sit to Supine     Supine to sit: Supervision, HOB elevated, Used rails Sit to supine: Supervision, HOB elevated, Used rails   General bed mobility comments: increased time and use of rails but no physical assist needed    Transfers Overall transfer level: Needs assistance Equipment used: Rolling walker (2 wheels) Transfers: Sit to/from Stand Sit to Stand: Contact guard assist           General transfer comment: Stood x 2 from significantly elevated bed; pt anxious with c/o pain and dizziness causing return to sitting; cues for relaxation; declined tx to chair but did take side steps at EOB    Ambulation/Gait Ambulation/Gait assistance: Contact guard assist Gait Distance (Feet): 2 Feet Assistive device: Rolling walker (2 wheels) Gait Pattern/deviations: Step-to pattern, Decreased stride length, Shuffle Gait velocity: decreased     General Gait Details: side steps at EOB; limited by pain and dizzienss  Stairs            Wheelchair Mobility     Tilt Bed    Modified Rankin (Stroke Patients Only)       Balance Overall balance assessment: Needs assistance Sitting-balance support: No upper extremity supported Sitting balance-Leahy Scale: Good     Standing balance support: Bilateral upper extremity supported, Reliant on assistive device for balance Standing balance-Leahy Scale: Poor                               Pertinent Vitals/Pain Pain Assessment  Pain Assessment: 0-10 Pain Score: 10-Worst pain ever Pain Location: chest-right Pain Descriptors / Indicators: Discomfort Pain Intervention(s): Limited activity within patient's tolerance, Monitored during session    Home Living Family/patient expects to be discharged to:: Skilled nursing facility                   Additional Comments: Pt resides at [facility].  Reports she has a w/c but does not have a RW as hers broke    Prior Function Prior  Level of Function : Needs assist             Mobility Comments: Pt ambulates in room holding onto furniture; uses w/c for longer distance; From [facility] no longer getting therapy ADLs Comments: Independent with ADLs     Extremity/Trunk Assessment   Upper Extremity Assessment Upper Extremity Assessment: Defer to OT evaluation    Lower Extremity Assessment Lower Extremity Assessment: Overall WFL for tasks assessed (appears strong and ROM WFL; did not MMT due to pt anxious and with c/o R chest pain (has lidocaine  patch))    Cervical / Trunk Assessment Cervical / Trunk Assessment: Other exceptions Cervical / Trunk Exceptions: Increased body habitus  Communication        Cognition Arousal: Alert Behavior During Therapy: Anxious   PT - Cognitive impairments: Problem solving                       PT - Cognition Comments: Frequent cues for relaxing and breathing         Cueing       General Comments General comments (skin integrity, edema, etc.):   Pt on 2 L O2 with sats >93% during evaluation.  BP in supine 143/70 (89) and HR 98. BP sitting 162/90 (110) and HR 101.  Unable to tolerate standing long enough for BP.  Pt reports dizziness/lightheadedness began with this illness.  She had dizziness/lightheadedness back in March but that had resolved.  She describes current symptoms as spinning and lightheaded with any movement.  No nystagmus noted.  Supine/sit BP stable.  Smooth pursuit -intact  but some dizziness with peripheral gaze.  Pt not tolerating further testing.  Did note similar symptoms with UTI in past and was negative for BPPV at that time.     Exercises     Assessment/Plan    PT Assessment Patient needs continued PT services  PT Problem List Decreased strength;Decreased mobility;Decreased range of motion;Decreased activity tolerance;Decreased balance;Decreased knowledge of use of DME;Pain;Decreased cognition;Decreased knowledge of  precautions;Decreased safety awareness       PT Treatment Interventions DME instruction;Gait training;Functional mobility training;Therapeutic activities;Patient/family education;Balance training;Therapeutic exercise;Neuromuscular re-education    PT Goals (Current goals can be found in the Care Plan section)  Acute Rehab PT Goals Patient Stated Goal: return to facility; improve pain PT Goal Formulation: With patient Time For Goal Achievement: 08/22/24 Potential to Achieve Goals: Good    Frequency Min 1X/week     Co-evaluation               AM-PAC PT 6 Clicks Mobility  Outcome Measure Help needed turning from your back to your side while in a flat bed without using bedrails?: A Little Help needed moving from lying on your back to sitting on the side of a flat bed without using bedrails?: A Little Help needed moving to and from a bed to a chair (including a wheelchair)?: A Little Help needed standing up from a chair using your arms (e.g.,  wheelchair or bedside chair)?: A Little Help needed to walk in hospital room?: A Little Help needed climbing 3-5 steps with a railing? : A Lot 6 Click Score: 17    End of Session Equipment Utilized During Treatment: Oxygen Activity Tolerance: Patient limited by pain;Other (comment) (limited by pain , anxious, dizzy) Patient left: in bed;with call bell/phone within reach;with bed alarm set Nurse Communication: Mobility status PT Visit Diagnosis: Other abnormalities of gait and mobility (R26.89);Muscle weakness (generalized) (M62.81)    Time: 8975-8952 PT Time Calculation (min) (ACUTE ONLY): 23 min   Charges:   PT Evaluation $PT Eval Low Complexity: 1 Low   PT General Charges $$ ACUTE PT VISIT: 1 Visit         Benjiman, PT Acute Rehab Services [facility] [phone]   Benjiman VEAR Mulberry 08/08/2024, 11:06 AM

## 2024-08-08 NOTE — Evaluation (Signed)
 Occupational Therapy Evaluation Patient Details Name: Jordan [name] MRN: [id number] DOB: 1966/05/09 Today's Date: 08/08/2024   History of Present Illness   Pt is 58 yo female admitted on 08/06/24 with sepsis due to pne and UTI.  Pt has c/o lightheadedness, dizziness, and chest pain.  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 Pt reports occasional assist for donning socks, but primarily independent in self care. Pt presents with dizziness (stable BP from supine to sit), chest pain, decreased activity tolerance, generalized weakness and anxiety. She does not require assist for bed mobility and is able to stand with CGA from elevated bed height. She was only able to tolerate side steps with RW along EOB this visit. SPow 93% on 2 L O2 throughout session.  Pt needs set up to total assist for ADLs. Patient will benefit from continued inpatient follow up therapy, <3 hours/day      If plan is discharge home, recommend the following:   A little help with walking and/or transfers;A lot of help with bathing/dressing/bathroom;Assistance with cooking/housework;Direct supervision/assist for medications management;Direct supervision/assist for financial management;Assist for transportation;Help with stairs or ramp for entrance     Functional Status Assessment   Patient has had a recent decline in their functional status and demonstrates the ability to make significant improvements in function in a reasonable and predictable amount of time.     Equipment Recommendations   None recommended by OT     Recommendations for Other Services         Precautions/Restrictions   Precautions Precautions: Fall Restrictions Weight Bearing Restrictions Per Provider Order: No      Mobility Bed Mobility Overal bed mobility: Needs Assistance Bed Mobility: Supine to Sit, Sit to Supine     Supine to sit: Supervision, HOB elevated, Used rails Sit to supine: Supervision, HOB elevated, Used rails        Transfers Overall transfer level: Needs assistance Equipment used: Rolling walker (2 wheels) Transfers: Sit to/from Stand Sit to Stand: Contact guard assist           General transfer comment: pt with anxiety and dizziness with sitting and standing, able to side step with RW along EOB only      Balance Overall balance assessment: Needs assistance   Sitting balance-Leahy Scale: Good     Standing balance support: Bilateral upper extremity supported, Reliant on assistive device for balance Standing balance-Leahy Scale: Poor                             ADL either performed or assessed with clinical judgement   ADL Overall ADL's : Needs assistance/impaired Eating/Feeding: Independent;Bed level   Grooming: Minimal assistance;Sitting   Upper Body Bathing: Moderate assistance;Sitting   Lower Body Bathing: Maximal assistance;Sit to/from stand   Upper Body Dressing : Minimal assistance;Sitting   Lower Body Dressing: Total assistance;Bed level       Toileting- Clothing Manipulation and Hygiene: Total assistance;Sit to/from stand       Functional mobility during ADLs: +2 for physical assistance;Minimal assistance;Rolling walker (2 wheels) (side steps along EOB) General ADL Comments: Poor activity tolerance.     Vision Ability to See in Adequate Light: 0 Adequate Patient Visual Report: No change from baseline  Perception         Praxis         Pertinent Vitals/Pain Pain Assessment Pain Assessment: Faces Faces Pain Scale: Hurts worst Pain Location: chest-right Pain Descriptors / Indicators: Discomfort, Grimacing, Guarding Pain Intervention(s): Monitored during session, Repositioned, Premedicated before session      Extremity/Trunk Assessment Upper Extremity Assessment Upper Extremity Assessment: Overall WFL for tasks assessed   Lower Extremity Assessment Lower Extremity Assessment: Defer to PT evaluation   Cervical / Trunk Assessment Cervical / Trunk Assessment: Other exceptions Cervical / Trunk Exceptions: Increased body habitus   Communication Communication Communication: No apparent difficulties;Other (comment) (pressured speech)   Cognition Arousal: Alert Behavior During Therapy: Anxious Cognition: No family/caregiver present to determine baseline                               Following commands: Intact       Cueing  General Comments   Cueing Techniques: Verbal cues  Pt on 2 L O2 with sats >93% during evaluation.  BP in supine 143/70 (89) and HR 98. BP sitting 162/90 (110) and HR 101.  Unable to tolerate standing long enough for BP.   Exercises     Shoulder Instructions      Home Living Family/patient expects to be discharged to:: Skilled nursing facility                                 Additional Comments: Pt resides at [facility].  Reports she has a w/c but does not have a RW as hers broke      Prior Functioning/Environment Prior Level of Function : Needs assist             Mobility Comments: Pt ambulates in room holding onto furniture; uses w/c for longer distance; From [facility] no longer getting therapy ADLs Comments: staff sometimes help don socks, primarily independent in basice self care    OT Problem List: Decreased strength;Decreased activity tolerance;Impaired balance (sitting and/or standing);Decreased cognition;Decreased safety awareness;Cardiopulmonary status limiting activity;Decreased knowledge of use of DME or AE;Obesity;Pain   OT Treatment/Interventions: Self-care/ADL training;DME and/or AE instruction;Therapeutic activities;Patient/family education;Balance training      OT Goals(Current goals can be  found in the care plan section)   Acute Rehab OT Goals OT Goal Formulation: With patient Time For Goal Achievement: 08/22/24 Potential to Achieve Goals: Good ADL Goals Pt Will Perform Grooming: with contact guard assist;standing (one activity) Pt Will Perform Lower Body Bathing: with min assist;sit to/from stand Pt Will Perform Lower Body Dressing: with min assist;sit to/from stand Pt Will Transfer to Toilet: with contact guard assist;ambulating;bedside commode Pt Will Perform Toileting - Clothing Manipulation and hygiene: with min assist;sit to/from stand Additional ADL Goal #1: Pt will employ pursed lip breathing technique during exertion with min verbal cues.   OT Frequency:  Min 2X/week    Co-evaluation              AM-PAC OT 6 Clicks Daily Activity     Outcome Measure Help from another person eating meals?: None Help from another person taking care of personal grooming?: A Little Help from another person toileting, which includes using toliet, bedpan, or urinal?: Total Help from another person bathing (including washing, rinsing, drying)?: A Lot Help from another person to put on and taking off regular upper body clothing?: A Little Help from another person to put  on and taking off regular lower body clothing?: Total 6 Click Score: 14   End of Session Equipment Utilized During Treatment: Gait belt;Rolling walker (2 wheels) Nurse Communication: Mobility status  Activity Tolerance: Patient limited by pain;Patient limited by fatigue Patient left: in bed;with bed alarm set;with call bell/phone within reach  OT Visit Diagnosis: Unsteadiness on feet (R26.81);Other symptoms and signs involving cognitive function;Muscle weakness (generalized) (M62.81);Pain;Other (comment) (decreased activity tolerance)                Time: 8976-8953 OT Time Calculation (min): 23 min Charges:  OT General Charges $OT Visit: 1 Visit OT Evaluation $OT Eval Moderate Complexity: 1 Mod  Mliss HERO, OTR/L Acute Rehabilitation Services Office: [phone]   Kennth Mliss Helling 08/08/2024, 2:45 PM

## 2024-08-09 ENCOUNTER — Ambulatory Visit: Payer: MEDICAID

## 2024-08-09 DIAGNOSIS — J189 Pneumonia, unspecified organism: Secondary | ICD-10-CM | POA: Diagnosis not present

## 2024-08-09 DIAGNOSIS — A419 Sepsis, unspecified organism: Secondary | ICD-10-CM | POA: Diagnosis not present

## 2024-08-09 LAB — BASIC METABOLIC PANEL WITH GFR
Anion gap: 10 (ref 5–15)
BUN: 13 mg/dL (ref 6–20)
CO2: 28 mmol/L (ref 22–32)
Calcium: 9.1 mg/dL (ref 8.9–10.3)
Chloride: 102 mmol/L (ref 98–111)
Creatinine, Ser: 0.55 mg/dL (ref 0.44–1.00)
GFR, Estimated: >60 mL/min (ref >60)
Glucose, Bld: 99 mg/dL (ref 70–99)
Potassium: 4.4 mmol/L (ref 3.5–5.1)
Sodium: 139 mmol/L (ref 135–145)

## 2024-08-09 LAB — URINE CULTURE: Culture: 60000 — AB

## 2024-08-09 LAB — CBC
HCT: 38 % (ref 36.0–46.0)
Hemoglobin: 11.9 g/dL — ABNORMAL LOW (ref 12.0–15.0)
MCH: 29.5 pg (ref 26.0–34.0)
MCHC: 31.3 g/dL (ref 30.0–36.0)
MCV: 94.3 fL (ref 80.0–100.0)
Platelets: 210 K/uL (ref 150–400)
RBC: 4.03 MIL/uL (ref 3.87–5.11)
RDW: 13.2 % (ref 11.5–15.5)
WBC: 4.1 K/uL (ref 4.0–10.5)
nRBC: 0 % (ref 0.0–0.2)

## 2024-08-09 MED ORDER — CEFAZOLIN SODIUM-DEXTROSE 2-4 GM/100ML-% IV SOLN
2.0000 g | Freq: Three times a day (TID) | INTRAVENOUS | Status: DC
  Administered 2024-08-09 – 2024-08-10 (×2): 2 g via INTRAVENOUS
  Filled 2024-08-09 (×2): qty 100

## 2024-08-09 MED ORDER — AZITHROMYCIN 500 MG PO TABS
500.0000 mg | ORAL_TABLET | Freq: Every day | ORAL | Status: DC
  Administered 2024-08-09 – 2024-08-10 (×2): 500 mg via ORAL
  Filled 2024-08-09: qty 1
  Filled 2024-08-09: qty 2

## 2024-08-09 NOTE — Progress Notes (Signed)
 PROGRESS NOTE    Jordan [name]  FMW:[id number] DOB: February 01, 1966 DOA: 08/06/2024 PCP: Patient, No Pcp Per   Brief Narrative: 58 year old with past medical history significant for SCC of vagina, bipolar disorder, anemia, alcohol abuse who presents with lightheadedness, cough, fever dysuria.  She was also complaining of chest pain.  In the ED she was noted to be febrile, heart rate 129, lactic acid 2.0.  Chest x-ray interval mild pneumonia in the right mid and lower lung zones, mild pulmonary vascular congestion.  CT chest multilobar pneumonia.   Assessment & Plan:   Principal Problem:   Sepsis due to pneumonia and UTI Active Problems:   Acute on chronic diastolic CHF (congestive heart failure) (HCC)   Acute respiratory failure with hypoxia (HCC)   Chest pain   Chronic pain   Bipolar disorder (HCC)   Anxiety state   Vaginal cancer (HCC)   Sepsis secondary to UTI (HCC)   Sepsis secondary to PNA and UTI - Patient presents with fever, tachycardia, leukocytosis, CT showed multifocal pneumonia UA consistent with UTI - Continue linezolid and meropenem - Follow urine  cultures; growing E coli.  -Received IV fluid Hold on IV due to concern for heart failure exacerbation  -Acute on chronic heart failure diastolic exacerbation - She received IV Lasix . Due to sepsis will hold further diuresis.  Monitor blood pressure. Oxygen requirement decreased to 2 L ECHO normal EF>  She has good urine out put.   Acute hypoxic respiratory failure Multifocal pneumonia -Respiratory panel positive for rhinovirus - Continue IV antibiotic--will discuss with pharmacy oral options.  -Continue with  DuoNeb  UTI: UA with more than 50 white blood cell. Follow urine culture. E coli and strep viridian's.  Continue meropenem  Chest pain Atypical, reproducible, also related to cough Echo reassurance troponin negative Related to cough.   Chronic pain: Continue gabapentin   Bipolar: Resume Depakote ,  Seroquel  and Effexor   Anxiety; resume Depakote   Vaginal cancer postradiation  Dizziness; PT eval.     Estimated body mass index is 56.55 kg/m as calculated from the following:   Height as of this encounter: 5' 8 (1.727 m).   Weight as of this encounter: 168.7 kg.   DVT prophylaxis: Lovenox  Code Status: Full code Family Communication: Care discussed with patient Disposition Plan:  Status is: Inpatient Remains inpatient appropriate because: management of Sepsis     Consultants:  none  Procedures:  ECHO  Antimicrobials:    Subjective: She is still complaining of chest pain. We discussed is likely related to PNA>  She doesn't know if she is still dizzy awaiting for PT eval today    Objective: Vitals:   08/09/24 0751 08/09/24 0753 08/09/24 0858 08/09/24 1305  BP:    (!) 140/95  Pulse:    87  Resp:    20  Temp:    98.2 F (36.8 C)  TempSrc:    Oral  SpO2: 97% 97%  94%  Weight:   (!) 168.7 kg   Height:        Intake/Output Summary (Last 24 hours) at 08/09/2024 1324 Last data filed at 08/09/2024 0910 Gross per 24 hour  Intake 1420 ml  Output 1650 ml  Net -230 ml   Filed Weights   08/07/24 0454 08/08/24 0426 08/09/24 0858  Weight: (!) 173.6 kg (!) 167 kg (!) 168.7 kg    Examination:  General exam: NAD Respiratory system: CTA Cardiovascular system: S 1, S 2 RRR Gastrointestinal system: BBS present, soft nt Central nervous system: Alert.  Extremities: Symmetric 5 x 5 power.   Data Reviewed: I have personally reviewed following labs and imaging studies  CBC: Recent Labs  Lab 08/06/24 0835 08/06/24 0848 08/07/24 0540 08/09/24 0831  WBC 13.4*  --  8.4 4.1  NEUTROABS 12.2*  --   --   --   HGB 12.1 12.2 10.8* 11.9*  HCT 38.0 36.0 34.2* 38.0  MCV 93.6  --  96.3 94.3  PLT 194  --  161 210   Basic Metabolic Panel: Recent Labs  Lab 08/06/24 0835 08/06/24 0848 08/07/24 0540 08/09/24 0831  NA 140 140 138 139  K 4.3 4.2 4.2 4.4  CL 105 104  103 102  CO2 25  --  29 28  GLUCOSE 151* 153* 94 99  BUN 13 11 10 13   CREATININE 0.73 0.70 0.60 0.55  CALCIUM 8.7*  --  8.5* 9.1   GFR: Estimated Creatinine Clearance: 131.1 mL/min (by C-G formula based on SCr of 0.55 mg/dL). Liver Function Tests: Recent Labs  Lab 08/06/24 0835  AST 36  ALT 28  ALKPHOS 139*  BILITOT 0.9  PROT 6.4*  ALBUMIN 3.6   Recent Labs  Lab 08/06/24 0835  LIPASE 23   No results for input(s): AMMONIA in the last 168 hours. Coagulation Profile: Recent Labs  Lab 08/06/24 0835  INR 1.0   Cardiac Enzymes: No results for input(s): CKTOTAL, CKMB, CKMBINDEX, TROPONINI in the last 168 hours. BNP (last 3 results) Recent Labs    08/06/24 0835  PROBNP 366.0*   HbA1C: No results for input(s): HGBA1C in the last 72 hours. CBG: No results for input(s): GLUCAP in the last 168 hours. Lipid Profile: No results for input(s): CHOL, HDL, LDLCALC, TRIG, CHOLHDL, LDLDIRECT in the last 72 hours. Thyroid Function Tests: Recent Labs    08/07/24 0540  TSH 1.870   Anemia Panel: No results for input(s): VITAMINB12, FOLATE, FERRITIN, TIBC, IRON , RETICCTPCT in the last 72 hours. Sepsis Labs: Recent Labs  Lab 08/06/24 0848 08/06/24 1113 08/06/24 1127 08/07/24 0540  PROCALCITON  --  0.80  --  1.39  LATICACIDVEN 2.0*  --  1.8  --     Recent Results (from the past 240 hours)  Resp panel by RT-PCR (RSV, Flu A&B, Covid) Anterior Nasal Swab     Status: None   Collection Time: 08/06/24  8:35 AM   Specimen: Anterior Nasal Swab  Result Value Ref Range Status   SARS Coronavirus 2 by RT PCR NEGATIVE NEGATIVE Final    Comment: (NOTE) SARS-CoV-2 target nucleic acids are NOT DETECTED.  The SARS-CoV-2 RNA is generally detectable in upper respiratory specimens during the acute phase of infection. The lowest concentration of SARS-CoV-2 viral copies this assay can detect is 138 copies/mL. A negative result does not preclude  SARS-Cov-2 infection and should not be used as the sole basis for treatment or other patient management decisions. A negative result may occur with  improper specimen collection/handling, submission of specimen other than nasopharyngeal swab, presence of viral mutation(s) within the areas targeted by this assay, and inadequate number of viral copies(<138 copies/mL). A negative result must be combined with clinical observations, patient history, and epidemiological information. The expected result is Negative.  Fact Sheet for Patients:  BloggerCourse.com  Fact Sheet for Healthcare Providers:  SeriousBroker.it  This test is no t yet approved or cleared by the [place]  FDA and  has been authorized for detection and/or diagnosis of SARS-CoV-2 by FDA under an Emergency Use Authorization (EUA). This EUA will  remain  in effect (meaning this test can be used) for the duration of the COVID-19 declaration under Section 564(b)(1) of the Act, 21 U.S.C.section 360bbb-3(b)(1), unless the authorization is terminated  or revoked sooner.       Influenza A by PCR NEGATIVE NEGATIVE Final   Influenza B by PCR NEGATIVE NEGATIVE Final    Comment: (NOTE) The Xpert Xpress SARS-CoV-2/FLU/RSV plus assay is intended as an aid in the diagnosis of influenza from Nasopharyngeal swab specimens and should not be used as a sole basis for treatment. Nasal washings and aspirates are unacceptable for Xpert Xpress SARS-CoV-2/FLU/RSV testing.  Fact Sheet for Patients: BloggerCourse.com  Fact Sheet for Healthcare Providers: SeriousBroker.it  This test is not yet approved or cleared by the [place]  FDA and has been authorized for detection and/or diagnosis of SARS-CoV-2 by FDA under an Emergency Use Authorization (EUA). This EUA will remain in effect (meaning this test can be used) for the duration of  the COVID-19 declaration under Section 564(b)(1) of the Act, 21 U.S.C. section 360bbb-3(b)(1), unless the authorization is terminated or revoked.     Resp Syncytial Virus by PCR NEGATIVE NEGATIVE Final    Comment: (NOTE) Fact Sheet for Patients: BloggerCourse.com  Fact Sheet for Healthcare Providers: SeriousBroker.it  This test is not yet approved or cleared by the [place]  FDA and has been authorized for detection and/or diagnosis of SARS-CoV-2 by FDA under an Emergency Use Authorization (EUA). This EUA will remain in effect (meaning this test can be used) for the duration of the COVID-19 declaration under Section 564(b)(1) of the Act, 21 U.S.C. section 360bbb-3(b)(1), unless the authorization is terminated or revoked.  Performed at [facility], [address], [place], [place] [address]   Blood Culture (routine x 2)     Status: None (Preliminary result)   Collection Time: 08/06/24  8:35 AM   Specimen: BLOOD LEFT HAND  Result Value Ref Range Status   Specimen Description   Final    BLOOD LEFT HAND Performed at [facility], [address], [place], [place] [address]    Special Requests   Final    BOTTLES DRAWN AEROBIC AND ANAEROBIC Blood Culture results may not be optimal due to an inadequate volume of blood received in culture bottles Performed at [facility], [address], [place], [place] [address]    Culture   Final    NO GROWTH 3 DAYS Performed at [facility], [address] [place], [place] [address]    Report Status PENDING  Incomplete  Respiratory (~20 pathogens) panel by PCR     Status: Abnormal   Collection Time: 08/06/24  8:35 AM   Specimen: Nasopharyngeal Swab; Respiratory  Result Value Ref Range Status   Adenovirus NOT DETECTED NOT DETECTED Final   Coronavirus 229E NOT DETECTED NOT DETECTED Final    Comment: (NOTE) The Coronavirus on the  Respiratory Panel, DOES NOT test for the novel  Coronavirus (2019 nCoV)    Coronavirus HKU1 NOT DETECTED NOT DETECTED Final   Coronavirus NL63 NOT DETECTED NOT DETECTED Final   Coronavirus OC43 NOT DETECTED NOT DETECTED Final   Metapneumovirus NOT DETECTED NOT DETECTED Final   Rhinovirus / Enterovirus DETECTED (A) NOT DETECTED Final   Influenza A NOT DETECTED NOT DETECTED Final   Influenza B NOT DETECTED NOT DETECTED Final   Parainfluenza Virus 1 NOT DETECTED NOT DETECTED Final   Parainfluenza Virus 2 NOT DETECTED NOT DETECTED Final   Parainfluenza Virus  3 NOT DETECTED NOT DETECTED Final   Parainfluenza Virus 4 NOT DETECTED NOT DETECTED Final   Respiratory Syncytial Virus NOT DETECTED NOT DETECTED Final   Bordetella pertussis NOT DETECTED NOT DETECTED Final   Bordetella Parapertussis NOT DETECTED NOT DETECTED Final   Chlamydophila pneumoniae NOT DETECTED NOT DETECTED Final   Mycoplasma pneumoniae NOT DETECTED NOT DETECTED Final    Comment: Performed at [facility], [address], [place], [place] [address]  Blood Culture (routine x 2)     Status: None (Preliminary result)   Collection Time: 08/06/24  8:49 AM   Specimen: BLOOD RIGHT HAND  Result Value Ref Range Status   Specimen Description   Final    BLOOD RIGHT HAND Performed at [facility], [address], [place], [place] [address]    Special Requests   Final    BOTTLES DRAWN AEROBIC AND ANAEROBIC Blood Culture results may not be optimal due to an inadequate volume of blood received in culture bottles Performed at [facility], [address], [place], [place] [address]    Culture   Final    NO GROWTH 3 DAYS Performed at [facility], [address] [place], [place] [address]    Report Status PENDING  Incomplete  Urine Culture     Status: Abnormal   Collection Time: 08/06/24  1:01 PM   Specimen: Urine, Random  Result Value Ref Range Status   Specimen Description   Final    URINE,  RANDOM Performed at [facility], [address], [place], [place] [address]    Special Requests   Final    NONE Reflexed from [id number] Performed at [facility], [address], [place], [place] [address]    Culture (A)  Final    60,000 COLONIES/mL ESCHERICHIA COLI 40,000 COLONIES/mL VIRIDANS STREPTOCOCCUS Standardized susceptibility testing for this organism is not available. Performed at [facility], [address], [place], [place] [address]    Report Status 08/09/2024 FINAL  Final   Organism ID, Bacteria ESCHERICHIA COLI (A)  Final      Susceptibility   Escherichia coli - MIC*    AMPICILLIN 4 SENSITIVE Sensitive     CEFAZOLIN  (URINE) Value in next row Sensitive      <=1 SENSITIVEThis is a modified FDA-approved test that has been validated and its performance characteristics determined by the reporting laboratory.  This laboratory is certified under the Clinical Laboratory Improvement Amendments CLIA as qualified to perform high complexity clinical laboratory testing.    CEFEPIME  Value in next row Sensitive      <=1 SENSITIVEThis is a modified FDA-approved test that has been validated and its performance characteristics determined by the reporting laboratory.  This laboratory is certified under the Clinical Laboratory Improvement Amendments CLIA as qualified to perform high complexity clinical laboratory testing.    ERTAPENEM  Value in next row Sensitive      <=1 SENSITIVEThis is a modified FDA-approved test that has been validated and its performance characteristics determined by the reporting laboratory.  This laboratory is certified under the Clinical Laboratory Improvement Amendments CLIA as qualified to perform high complexity clinical laboratory testing.    CEFTRIAXONE  Value in next row Sensitive      <=1 SENSITIVEThis is a modified FDA-approved test that has been validated and its performance characteristics determined by the reporting  laboratory.  This laboratory is certified under the Clinical Laboratory Improvement Amendments CLIA as qualified to perform high  complexity clinical laboratory testing.    CIPROFLOXACIN  Value in next row Sensitive      <=1 SENSITIVEThis is a modified FDA-approved test that has been validated and its performance characteristics determined by the reporting laboratory.  This laboratory is certified under the Clinical Laboratory Improvement Amendments CLIA as qualified to perform high complexity clinical laboratory testing.    GENTAMICIN  Value in next row Sensitive      <=1 SENSITIVEThis is a modified FDA-approved test that has been validated and its performance characteristics determined by the reporting laboratory.  This laboratory is certified under the Clinical Laboratory Improvement Amendments CLIA as qualified to perform high complexity clinical laboratory testing.    NITROFURANTOIN  Value in next row Sensitive      <=1 SENSITIVEThis is a modified FDA-approved test that has been validated and its performance characteristics determined by the reporting laboratory.  This laboratory is certified under the Clinical Laboratory Improvement Amendments CLIA as qualified to perform high complexity clinical laboratory testing.    TRIMETH /SULFA  Value in next row Sensitive      <=1 SENSITIVEThis is a modified FDA-approved test that has been validated and its performance characteristics determined by the reporting laboratory.  This laboratory is certified under the Clinical Laboratory Improvement Amendments CLIA as qualified to perform high complexity clinical laboratory testing.    AMPICILLIN/SULBACTAM Value in next row Sensitive      <=1 SENSITIVEThis is a modified FDA-approved test that has been validated and its performance characteristics determined by the reporting laboratory.  This laboratory is certified under the Clinical Laboratory Improvement Amendments CLIA as qualified to perform high complexity clinical  laboratory testing.    PIP/TAZO Value in next row Sensitive      <=4 SENSITIVEThis is a modified FDA-approved test that has been validated and its performance characteristics determined by the reporting laboratory.  This laboratory is certified under the Clinical Laboratory Improvement Amendments CLIA as qualified to perform high complexity clinical laboratory testing.    MEROPENEM Value in next row Sensitive      <=4 SENSITIVEThis is a modified FDA-approved test that has been validated and its performance characteristics determined by the reporting laboratory.  This laboratory is certified under the Clinical Laboratory Improvement Amendments CLIA as qualified to perform high complexity clinical laboratory testing.    * 60,000 COLONIES/mL ESCHERICHIA COLI         Radiology Studies: No results found.       Scheduled Meds:  acetaminophen   500 mg Oral TID   budesonide (PULMICORT) nebulizer solution  0.25 mg Nebulization BID   divalproex   125 mg Oral TID   enoxaparin  (LOVENOX ) injection  80 mg Subcutaneous Q24H   gabapentin   800 mg Oral TID   guaiFENesin   1,200 mg Oral BID   ipratropium-albuterol   3 mL Nebulization TID   lidocaine   1 patch Transdermal Daily   multivitamin with minerals  1 tablet Oral Daily   QUEtiapine   50 mg Oral QHS   venlafaxine  XR  75 mg Oral Q breakfast   Continuous Infusions:  linezolid (ZYVOX) IV 600 mg (08/09/24 1015)   meropenem (MERREM) IV 1 g (08/09/24 1307)     LOS: 3 days    Time spent: 35 Minutes    Jayda White A Briann Sarchet, MD [facility]   If 7PM-7AM, please contact night-coverage [email or web address]  08/09/2024, 1:24 PM

## 2024-08-09 NOTE — Progress Notes (Signed)
 Physical Therapy Treatment Patient Details Name: Jordan [name] MRN: [id number] DOB: 1966-01-28 Today's Date: 08/09/2024   History of Present Illness Pt is 58 yo female admitted on 08/06/24 with sepsis due to pne and UTI.  Pt has c/o lightheadedness, dizziness, and chest pain.  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 Continues to c/o dizziness, chest pain, dyspnea-also seems to have some anxiety. BP: 133/68 in sitting, 117/96 (pt barely able to stand the entire time). O2: 93% on RA at rest, 86% on RA with minimal activity, 95% on 2L with minimal activity. Pt stood x 3 times and performed a stand pivot, with RW, during session. Encouraged pt to try to mobilize to/from [facility] with nursing during the day to increase activity/OOB. Will continue to follow and progress activity as tolerated.     If plan is discharge home, recommend the following: Help with stairs or ramp for entrance;Assist for transportation;Assistance with cooking/housework;A little help with bathing/dressing/bathroom;A little help with walking and/or transfers   Can travel by private vehicle     No  Equipment Recommendations  Rolling walker (2 wheels) (bariatric)    Recommendations for Other Services       Precautions / Restrictions Precautions Precautions: Fall Precaution/Restrictions Comments: monitor O2 Restrictions Weight Bearing Restrictions Per Provider Order: No     Mobility  Bed Mobility Overal bed mobility: Needs Assistance Bed Mobility: Sit to Supine       Sit to supine: Supervision   General bed mobility comments: Supv for lines only-no physical assistance    Transfers Overall transfer level: Needs assistance Equipment used: Rolling walker (2 wheels) Transfers: Sit to/from Stand, Bed to chair/wheelchair/BSC Sit to Stand: Contact guard assist   Step pivot transfers: Contact  guard assist       General transfer comment: pt with anxiety and dizziness with standing-barely standing long enough to get standing BP. Pt propping forearm on RW handle with report to symptoms. Sit to stand x 3 with RW. Increased time. Cues for safety, hand placement.    Ambulation/Gait               General Gait Details: Deferred for safety reasons; limited by pt report of dizziness, chest pain, dyspnea-pt barely able to stand long enough to assess standing BP.    Stairs             Wheelchair Mobility     Tilt Bed    Modified Rankin (Stroke Patients Only)       Balance Overall balance assessment: Needs assistance         Standing balance support: Bilateral upper extremity supported, During functional activity, Reliant on assistive device for balance Standing balance-Leahy Scale: Poor                              Communication Communication Communication: No apparent difficulties;Other (comment)  Cognition Arousal: Alert Behavior During Therapy: Anxious   PT - Cognitive impairments: Problem solving                       PT - Cognition Comments: Frequent cues for relaxing and breathing Following commands: Intact      Cueing Cueing Techniques: Verbal cues  Exercises      General Comments        Pertinent Vitals/Pain Pain  Assessment Pain Assessment: Faces Faces Pain Scale: Hurts worst Pain Location: chest Pain Descriptors / Indicators: Discomfort, Grimacing, Guarding Pain Intervention(s): Limited activity within patient's tolerance, Monitored during session, Repositioned    Home Living                          Prior Function            PT Goals (current goals can now be found in the care plan section) Progress towards PT goals: Progressing toward goals    Frequency    Min 2X/week      PT Plan      Co-evaluation              AM-PAC PT 6 Clicks Mobility   Outcome Measure  Help  needed turning from your back to your side while in a flat bed without using bedrails?: A Little Help needed moving from lying on your back to sitting on the side of a flat bed without using bedrails?: A Little Help needed moving to and from a bed to a chair (including a wheelchair)?: A Little Help needed standing up from a chair using your arms (e.g., wheelchair or bedside chair)?: A Little Help needed to walk in hospital room?: A Lot Help needed climbing 3-5 steps with a railing? : A Lot 6 Click Score: 16    End of Session Equipment Utilized During Treatment: Oxygen Activity Tolerance: Patient limited by fatigue (limited by anxiety) Patient left: in bed;with call bell/phone within reach;with bed alarm set   PT Visit Diagnosis: Other abnormalities of gait and mobility (R26.89);Muscle weakness (generalized) (M62.81)     Time: 8363-8289 PT Time Calculation (min) (ACUTE ONLY): 34 min  Charges:    $Therapeutic Activity: 23-37 mins PT General Charges $$ ACUTE PT VISIT: 1 Visit                       Dannial SQUIBB, PT Acute Rehabilitation  Office: [phone]

## 2024-08-09 NOTE — TOC Initial Note (Addendum)
 Transition of Care [facility]) - Initial/Assessment Note    Patient Details  Name: Jordan [name] MRN: [id number] Date of Birth: July 04, 1966  Transition of Care [facility]) CM/SW Contact:    Bascom Service, RN Phone Number: 08/09/2024, 11:09 AM  Clinical Narrative: From [facility] confirmed w/patient, & [facility] adm asst [place]- for return. Uses w/c, PTA L hip wound-indep w/care. On 02-will monitor. Fax fl2 to [phone]. -3:36p confirmed w/[facility] received faxed fl2-await when ready for return.                  Expected Discharge Plan: Assisted Living Barriers to Discharge: Continued Medical Work up   Patient Goals and CMS Choice Patient states their goals for this hospitalization and ongoing recovery are:: Return back to [facility] CMS Medicare.gov Compare Post Acute Care list provided to:: Patient Choice offered to / list presented to : Patient [place] ownership interest in [facility].provided to:: Patient    Expected Discharge Plan and Services   Discharge Planning Services: CM Consult   Living arrangements for the past 2 months: Assisted Living Facility                                      Prior Living Arrangements/Services Living arrangements for the past 2 months: Assisted Living Facility Lives with:: Self              Current home services: DME (w/c)    Activities of Daily Living   ADL Screening (condition at time of admission) Independently performs ADLs?: No Does the patient have a NEW difficulty with bathing/dressing/toileting/self-feeding that is expected to last >3 days?: Yes (Initiates electronic notice to provider for possible OT consult) Does the patient have a NEW difficulty with getting in/out of bed, walking, or climbing stairs that is expected to last >3 days?: Yes (Initiates electronic notice to provider for possible PT consult) Does the patient have a NEW difficulty with communication that is expected to  last >3 days?: No Is the patient deaf or have difficulty hearing?: No Does the patient have difficulty seeing, even when wearing glasses/contacts?: No Does the patient have difficulty concentrating, remembering, or making decisions?: No  Permission Sought/Granted                  Emotional Assessment              Admission diagnosis:  Cystitis [N30.90] Sepsis due to pneumonia (HCC) [J18.9, A41.9] Pneumonia of right lung due to infectious organism, unspecified part of lung [J18.9] Patient Active Problem List   Diagnosis Date Noted   Sepsis due to pneumonia and UTI 08/06/2024   Acute on chronic diastolic CHF (congestive heart failure) (HCC) 08/06/2024   Sepsis secondary to UTI (HCC) 08/06/2024   Acute respiratory failure with hypoxia (HCC) 08/06/2024   UTI (urinary tract infection) 12/23/2023   Closed fracture of right tibia and fibula 09/13/2023   Closed displaced fracture of first metatarsal bone of right foot 09/13/2023   Thoracic radiculitis 02/10/2023   Schizophrenia (HCC) 02/10/2023   Pain in thoracic spine 02/10/2023   Moderate intellectual disability 02/10/2023   Lumbar radiculopathy 02/10/2023   Chronic pain 02/10/2023   Cystitis 01/16/2023   Dysuria 12/12/2022   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   Vitamin B12 deficiency 08/19/2022   Complicated UTI (urinary tract infection) 08/17/2022   Grief 04/17/2022   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   Menopausal  symptoms 05/02/2021   Urge incontinence of urine 12/26/2020   Leukopenia    Vitamin D  deficiency    Morbid obesity with BMI of 40.0-44.9, adult (HCC) 06/11/2019   Vaginal cancer (HCC) 05/28/2019   Visit for routine gyn exam 04/21/2019   Vaginal atrophy 04/21/2019   Urinary tract infection with hematuria 07/01/2015   Diastolic dysfunction 07/01/2015   Constipation 05/12/2015   ESBL (extended spectrum beta-lactamase) producing bacteria infection 03/17/2015   Weakness generalized 03/02/2015   Megaloblastic anemia  02/22/2015   Anxiety state 02/20/2015   Claustrophobia 02/20/2015   Generalized anxiety disorder 02/20/2015   Transaminitis 02/18/2015   Chest pain 02/18/2015   Pancytopenia (HCC)    Abdominal pain    Dizziness    Hypotension 01/04/2015   Bipolar disorder (HCC) 06/29/2012   PCP:  Patient, No Pcp Per Pharmacy:   [facility] [place], [place] - [address] [place] [address] Phone: [phone] Fax: [phone]     Social Drivers of Health (SDOH) Social History: SDOH Screenings   Food Insecurity: Food Insecurity Present (02/02/2024)  Housing: Low Risk  (12/23/2023)  Transportation Needs: No Transportation Needs (12/23/2023)  Utilities: Not At Risk (12/23/2023)  Depression (PHQ2-9): Low Risk  (07/16/2023)  Tobacco Use: Low Risk  (08/06/2024)   SDOH Interventions:     Readmission Risk Interventions    12/26/2023    3:11 PM 12/26/2023    2:48 PM 12/25/2023   10:59 AM  Readmission Risk Prevention Plan  Transportation Screening Complete Complete Complete  PCP or Specialist Appt within 5-7 Days Complete Complete Complete  Home Care Screening  Complete Complete  Medication Review (RN CM)  Complete Complete

## 2024-08-09 NOTE — Plan of Care (Signed)

## 2024-08-09 NOTE — Progress Notes (Signed)
 Mobility Specialist - Progress Note   During mobility: 169/70 mmHg BP     08/09/24 1558  Mobility  Activity Pivoted/transferred from bed to chair  Level of Assistance Minimal assist, patient does 75% or more  Assistive Device Front wheel walker  Distance Ambulated (ft) 3 ft  Activity Response Tolerated poorly  Mobility Referral Yes  Mobility visit 1 Mobility  Mobility Specialist Start Time (ACUTE ONLY) 1530  Mobility Specialist Stop Time (ACUTE ONLY) 1555  Mobility Specialist Time Calculation (min) (ACUTE ONLY) 25 min   Pt was received in bed and agreed to mobility. Min A x2 failed sit to stand, became dizzy. Took BP (given above). Stated having left chest pain. Returned to recliner with all needs met. Call bell in reach.  [employer] - Mobility Specialist

## 2024-08-09 NOTE — NC FL2 (Signed)
 [place]  MEDICAID FL2 LEVEL OF CARE FORM     IDENTIFICATION  Patient Name: Jordan [name] Birthdate: 12/30/1965 Sex: female Admission Date (Current Location): 08/06/2024  [facility] and [place] Number:  Producer, television/film/video and Address:  [facility],  [address], [place] [address]      Provider Number: [id number]  Attending Physician Name and Address:  Madelyne Owen LABOR, MD  Relative Name and Phone Number:  Joni(Harding) Friend [phone]    Current Level of Care: Hospital Recommended Level of Care: Assisted Living Facility Prior Approval Number:    Date Approved/Denied:   PASRR Number:    Discharge Plan: Other (Comment) (Return [facility])    Current Diagnoses: Patient Active Problem List   Diagnosis Date Noted   Sepsis due to pneumonia and UTI 08/06/2024   Acute on chronic diastolic CHF (congestive heart failure) (HCC) 08/06/2024   Sepsis secondary to UTI (HCC) 08/06/2024   Acute respiratory failure with hypoxia (HCC) 08/06/2024   UTI (urinary tract infection) 12/23/2023   Closed fracture of right tibia and fibula 09/13/2023   Closed displaced fracture of first metatarsal bone of right foot 09/13/2023   Thoracic radiculitis 02/10/2023   Schizophrenia (HCC) 02/10/2023   Pain in thoracic spine 02/10/2023   Moderate intellectual disability 02/10/2023   Lumbar radiculopathy 02/10/2023   Chronic pain 02/10/2023   Cystitis 01/16/2023   Dysuria 12/12/2022   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   Vitamin B12 deficiency 08/19/2022   Complicated UTI (urinary tract infection) 08/17/2022   Grief 04/17/2022   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   Menopausal symptoms 05/02/2021   Urge incontinence of urine 12/26/2020   Leukopenia    Vitamin D  deficiency    Morbid obesity with BMI of 40.0-44.9, adult (HCC) 06/11/2019   Vaginal cancer (HCC) 05/28/2019   Visit for routine gyn exam 04/21/2019   Vaginal atrophy 04/21/2019   Urinary tract infection with hematuria  07/01/2015   Diastolic dysfunction 07/01/2015   Constipation 05/12/2015   ESBL (extended spectrum beta-lactamase) producing bacteria infection 03/17/2015   Weakness generalized 03/02/2015   Megaloblastic anemia 02/22/2015   Anxiety state 02/20/2015   Claustrophobia 02/20/2015   Generalized anxiety disorder 02/20/2015   Transaminitis 02/18/2015   Chest pain 02/18/2015   Pancytopenia (HCC)    Abdominal pain    Dizziness    Hypotension 01/04/2015   Bipolar disorder (HCC) 06/29/2012    Orientation RESPIRATION BLADDER Height & Weight     Self, Time, Situation, Place  O2 Continent Weight: (!) 168.7 kg Height:  5' 8 (172.7 cm)  BEHAVIORAL SYMPTOMS/MOOD NEUROLOGICAL BOWEL NUTRITION STATUS      Continent Diet (Heart Healthy)  AMBULATORY STATUS COMMUNICATION OF NEEDS Skin   Limited Assist Verbally Other (Comment) (L hip wound-see d/c summary)                       Personal Care Assistance Level of Assistance  Bathing, Feeding, Dressing Bathing Assistance: Independent Feeding assistance: Independent Dressing Assistance: Independent     Functional Limitations Info  Sight, Hearing, Speech Sight Info: Adequate Hearing Info: Adequate Speech Info: Adequate    SPECIAL CARE FACTORS FREQUENCY  PT (By licensed PT), OT (By licensed OT)     PT Frequency: 1x week OT Frequency: 1x week            Contractures Contractures Info: Not present    Additional Factors Info  Code Status, Allergies Code Status Info: Full Allergies Info: Ceftriaxone , Ciprofloxacin , Citalopram , Lamotrigine , Sulfa  Antibiotics, Tramadol  Current Medications (08/09/2024):  This is the current hospital active medication list Current Facility-Administered Medications  Medication Dose Route Frequency Provider Last Rate Last Admin   acetaminophen  (TYLENOL ) tablet 650 mg  650 mg Oral Q6H PRN Waddell Rake, MD       Or   acetaminophen  (TYLENOL ) suppository 650 mg  650 mg Rectal Q6H PRN Waddell Rake, MD       acetaminophen  (TYLENOL ) tablet 500 mg  500 mg Oral TID Regalado, Belkys A, MD   500 mg at 08/09/24 1011   albuterol  (PROVENTIL ) (2.5 MG/3ML) 0.083% nebulizer solution 2.5 mg  2.5 mg Nebulization Q4H PRN Regalado, Belkys A, MD       budesonide (PULMICORT) nebulizer solution 0.25 mg  0.25 mg Nebulization BID Regalado, Belkys A, MD   0.25 mg at 08/09/24 0749   divalproex  (DEPAKOTE ) DR tablet 125 mg  125 mg Oral TID Regalado, Belkys A, MD   125 mg at 08/09/24 1011   enoxaparin  (LOVENOX ) injection 80 mg  80 mg Subcutaneous Q24H Waddell Rake, MD   80 mg at 08/08/24 2149   gabapentin  (NEURONTIN ) capsule 800 mg  800 mg Oral TID Waddell Rake, MD   800 mg at 08/09/24 1011   guaiFENesin  (MUCINEX ) 12 hr tablet 1,200 mg  1,200 mg Oral BID Regalado, Belkys A, MD   1,200 mg at 08/09/24 1011   HYDROcodone -acetaminophen  (NORCO/VICODIN) 5-325 MG per tablet 1-2 tablet  1-2 tablet Oral Q4H PRN Waddell Rake, MD   2 tablet at 08/08/24 2121   ipratropium-albuterol  (DUONEB) 0.5-2.5 (3) MG/3ML nebulizer solution 3 mL  3 mL Nebulization TID Regalado, Belkys A, MD   3 mL at 08/09/24 0749   lidocaine  (LIDODERM ) 5 % 1 patch  1 patch Transdermal Daily Regalado, Belkys A, MD   1 patch at 08/09/24 1010   linezolid (ZYVOX) IVPB 600 mg  600 mg Intravenous Q12H Rogelia Jerilynn RAMAN, MD 300 mL/hr at 08/09/24 1015 600 mg at 08/09/24 1015   meropenem (MERREM) 1 g in sodium chloride  0.9 % 100 mL IVPB  1 g Intravenous Q8H Rogelia Jerilynn RAMAN, MD 200 mL/hr at 08/09/24 0621 1 g at 08/09/24 9378   metoprolol tartrate (LOPRESSOR) injection 5 mg  5 mg Intravenous Q6H PRN Waddell Rake, MD       morphine  (PF) 2 MG/ML injection 2 mg  2 mg Intravenous Q2H PRN Rogelia Jerilynn RAMAN, MD   2 mg at 08/09/24 9192   multivitamin with minerals tablet 1 tablet  1 tablet Oral Daily Regalado, Belkys A, MD   1 tablet at 08/09/24 1011   ondansetron  (ZOFRAN ) tablet 4 mg  4 mg Oral Q6H PRN Waddell Rake, MD       Or   ondansetron  (ZOFRAN )  injection 4 mg  4 mg Intravenous Q6H PRN Waddell Rake, MD       QUEtiapine  (SEROQUEL ) tablet 50 mg  50 mg Oral QHS Regalado, Belkys A, MD   50 mg at 08/08/24 2121   venlafaxine  XR (EFFEXOR -XR) 24 hr capsule 75 mg  75 mg Oral Q breakfast Regalado, Belkys A, MD   75 mg at 08/09/24 0807     Discharge Medications: Please see discharge summary for a list of discharge medications.  Relevant Imaging Results:  Relevant Lab Results:   Additional Information ss#[phone] [place], [place]

## 2024-08-10 ENCOUNTER — Encounter: Payer: Self-pay | Admitting: Hematology and Oncology

## 2024-08-10 DIAGNOSIS — A419 Sepsis, unspecified organism: Secondary | ICD-10-CM | POA: Diagnosis not present

## 2024-08-10 DIAGNOSIS — J189 Pneumonia, unspecified organism: Secondary | ICD-10-CM | POA: Diagnosis not present

## 2024-08-10 MED ORDER — IPRATROPIUM-ALBUTEROL 0.5-2.5 (3) MG/3ML IN SOLN: 3.0000 mL | Freq: Three times a day (TID) | RESPIRATORY_TRACT | 0 refills | Status: AC

## 2024-08-10 MED ORDER — ALBUTEROL SULFATE (2.5 MG/3ML) 0.083% IN NEBU: 2.5000 mg | INHALATION_SOLUTION | RESPIRATORY_TRACT | 12 refills | Status: AC | PRN

## 2024-08-10 MED ORDER — LIDOCAINE 5 % EX PTCH: 1.0000 | MEDICATED_PATCH | Freq: Every day | CUTANEOUS | 0 refills | Status: AC

## 2024-08-10 MED ORDER — GUAIFENESIN ER 600 MG PO TB12: 1200.0000 mg | ORAL_TABLET | Freq: Two times a day (BID) | ORAL | 0 refills | Status: AC

## 2024-08-10 MED ORDER — AZITHROMYCIN 500 MG PO TABS: 500.0000 mg | ORAL_TABLET | Freq: Every day | ORAL | 0 refills | Status: AC

## 2024-08-10 MED ORDER — POLYETHYLENE GLYCOL 3350 17 G PO PACK: 17.0000 g | PACK | Freq: Every day | ORAL | 0 refills | Status: AC

## 2024-08-10 MED ORDER — CEFADROXIL 500 MG PO CAPS: 500.0000 mg | ORAL_CAPSULE | Freq: Two times a day (BID) | ORAL | 0 refills | Status: AC

## 2024-08-10 MED ORDER — CEFADROXIL 500 MG PO CAPS
500.0000 mg | ORAL_CAPSULE | Freq: Two times a day (BID) | ORAL | Status: DC
  Administered 2024-08-10: 500 mg via ORAL
  Filled 2024-08-10 (×2): qty 1

## 2024-08-10 NOTE — Progress Notes (Addendum)
 Per PT   SATURATION QUALIFICATIONS: (This note is used to comply with regulatory documentation for home oxygen)  Patient Saturations on Room Air at Rest = 93%  Patient Saturations on Room Air while Ambulating = 86%  Patient Saturations on 2 Liters of oxygen while Ambulating = 95%  Please briefly explain why patient needs home oxygen:

## 2024-08-10 NOTE — Progress Notes (Signed)
 Report called to facility.  Awaiting PTAR. Issacc Merlo B

## 2024-08-10 NOTE — TOC CM/SW Note (Signed)
    Durable Medical Equipment  (From admission, onward)           Start     Ordered   08/10/24 1355  For home use only DME oxygen  Once       Question Answer Comment  Length of Need 6 Months   Mode or (Route) Nasal cannula   Liters per Minute 2   Frequency Continuous (stationary and portable oxygen unit needed)   Oxygen delivery system Gas      08/10/24 1354

## 2024-08-10 NOTE — Discharge Summary (Signed)
 Physician Discharge Summary   Patient: Jordan [name] MRN: [id number] DOB: Sep 02, 1966  Admit date:     08/06/2024  Discharge date: 08/10/24  Discharge Physician: Owen DELENA Lore   PCP: Patient, No Pcp Per   Recommendations at discharge:   Follow up resolution of PNA>  She will need 2 L oxygen.   Discharge Diagnoses: Principal Problem:   Sepsis due to pneumonia and UTI Active Problems:   Acute on chronic diastolic CHF (congestive heart failure) (HCC)   Acute respiratory failure with hypoxia (HCC)   Chest pain   Chronic pain   Bipolar disorder (HCC)   Anxiety state   Vaginal cancer (HCC)   Sepsis secondary to UTI [facility])  Resolved Problems:   * No resolved hospital problems. *  Hospital Course: 58 year old with past medical history significant for SCC of vagina, bipolar disorder, anemia, alcohol abuse who presents with lightheadedness, cough, fever dysuria.  She was also complaining of chest pain.  In the ED she was noted to be febrile, heart rate 129, lactic acid 2.0.  Chest x-ray interval mild pneumonia in the right mid and lower lung zones, mild pulmonary vascular congestion.  CT chest multilobar pneumonia.    Assessment and Plan: Sepsis secondary to PNA and UTI - Patient presents with fever, tachycardia, leukocytosis, CT showed multifocal pneumonia UA consistent with UTI - Continue linezolid and meropenem - Follow urine  cultures; growing E coli.  -Received IV fluid Hold on IV due to concern for heart failure exacerbation She will need 2 L oxygen.  Oxygen requirement: BP: O2: 93% on RA at rest, 86% on RA with minimal activity, 95% on 2L with minimal activity.  Discharge on Cefadroxil.   -Acute on chronic heart failure diastolic exacerbation - She received IV Lasix . Due to sepsis will hold further diuresis.  Monitor blood pressure. Oxygen requirement decreased to 2 L ECHO normal EF>  She has good urine out put.  No further diuretics needed.   Acute hypoxic  respiratory failure Multifocal pneumonia -Respiratory panel positive for rhinovirus - treated with ancef  and Azithromycin . Discharge on  -Continue with  DuoNeb   UTI: UA with more than 50 white blood cell. Follow urine culture. E coli and strep viridian's.  Treated with  meropenem, discharge on cefadroxil/.    Chest pain Atypical, reproducible, also related to cough Echo reassurance troponin negative Related to cough.    Chronic pain: Continue gabapentin    Bipolar: Resume Depakote , Seroquel  and Effexor    Anxiety; resume Depakote    Vaginal cancer postradiation   Dizziness; PT eval.         Consultants: None Procedures performed: none Disposition: Assisted living Diet recommendation:  Discharge Diet Orders (From admission, onward)     Start     Ordered   08/10/24 0000  Diet - low sodium heart healthy        08/10/24 1350           Carb modified diet DISCHARGE MEDICATION: Allergies as of 08/10/2024       Reactions   Ceftriaxone  Anaphylaxis, Hives, Shortness Of Breath, Other (See Comments)   See progess note. Administration results in hives, throat pain, flushing and SOB.   Ciprofloxacin  Swelling, Other (See Comments)   Lips swell, tongue swells, face swells   Citalopram  Swelling, Other (See Comments)   Possible cause of pancytopenia. Swelling of tongue, face and throat   Lamotrigine  Swelling, Other (See Comments)   Possible cause of pancytopenia. Swelling of face, throat and tongue   Sulfa  Antibiotics  Anaphylaxis   Tramadol Anaphylaxis, Shortness Of Breath, Swelling        Medication List     TAKE these medications    acetaminophen  325 MG tablet Commonly known as: TYLENOL  Take 2 tablets (650 mg total) by mouth every 6 (six) hours as needed for mild pain (pain score 1-3), fever or headache.   albuterol  (2.5 MG/3ML) 0.083% nebulizer solution Commonly known as: PROVENTIL  Take 3 mLs (2.5 mg total) by nebulization every 4 (four) hours as needed  for wheezing or shortness of breath.   azithromycin  500 MG tablet Commonly known as: ZITHROMAX  Take 1 tablet (500 mg total) by mouth daily for 1 day. Start taking on: August 11, 2024   [facility] Protect Moisture Barrier 12 % Crea Generic drug: Zinc Oxide Apply 1 application  topically See admin instructions. Apply to reddened, diaper area after each cleansing   cefadroxil 500 MG capsule Commonly known as: DURICEF Take 1 capsule (500 mg total) by mouth 2 (two) times daily for 3 days.   desvenlafaxine  50 MG 24 hr tablet Commonly known as: PRISTIQ  Take 50 mg by mouth every morning.   divalproex  125 MG DR tablet Commonly known as: DEPAKOTE  Take 125 mg by mouth 3 (three) times daily.   estradiol  0.075 MG/24HR Commonly known as: VIVELLE -DOT Place 1 patch onto the skin 2 (two) times a week.   gabapentin  800 MG tablet Commonly known as: NEURONTIN  Take 800 mg by mouth 3 (three) times daily.   guaiFENesin  600 MG 12 hr tablet Commonly known as: MUCINEX  Take 2 tablets (1,200 mg total) by mouth 2 (two) times daily.   ipratropium-albuterol  0.5-2.5 (3) MG/3ML Soln Commonly known as: DUONEB Take 3 mLs by nebulization 3 (three) times daily.   lidocaine  5 % Commonly known as: LIDODERM  Place 1 patch onto the skin daily. Remove & Discard patch within 12 hours or as directed by MD Start taking on: August 11, 2024   loperamide  2 MG capsule Commonly known as: IMODIUM  Take 1 capsule (2 mg total) by mouth every 8 (eight) hours as needed for diarrhea or loose stools.   meclizine  25 MG tablet Commonly known as: ANTIVERT  Take 25 mg by mouth every 6 (six) hours as needed for dizziness.   multivitamin with minerals Tabs tablet Take 1 tablet by mouth daily.   nystatin  powder Apply 1 Application topically 2 (two) times daily as needed (for groin irritation).   polyethylene glycol 17 g packet Commonly known as: MIRALAX  / GLYCOLAX  Take 17 g by mouth daily.   QUEtiapine  50 MG tablet Commonly  known as: SEROQUEL  Take 50 mg by mouth in the morning and at bedtime.   Tab-A-Vite Tabs Take 1 tablet by mouth daily with breakfast.               Durable Medical Equipment  (From admission, onward)           Start     Ordered   08/10/24 1355  For home use only DME oxygen  Once       Question Answer Comment  Length of Need 6 Months   Mode or (Route) Nasal cannula   Liters per Minute 2   Frequency Continuous (stationary and portable oxygen unit needed)   Oxygen delivery system Gas      08/10/24 1354              Discharge Care Instructions  (From admission, onward)           Start  Ordered   08/10/24 0000  Discharge wound care:       Comments: See above   08/10/24 1350            Contact information for after-discharge care     Destination     [facility] .   Service: Assisted Living Contact information: [phone] [address] [place]  [phone]                    Discharge Exam: Filed Weights   08/08/24 0426 08/09/24 0858 08/10/24 0500  Weight: (!) 167 kg (!) 168.7 kg (!) 168.7 kg   General NAD  Condition at discharge: stable  The results of significant diagnostics from this hospitalization (including imaging, microbiology, ancillary and laboratory) are listed below for reference.   Imaging Studies: ECHOCARDIOGRAM COMPLETE Result Date: 08/07/2024    ECHOCARDIOGRAM REPORT   Patient Name:   Jordan [name] Date of Exam: 08/07/2024 Medical Rec #:  [id number]     Height:       68.0 in Accession #:    [id number]    Weight:       382.7 lb Date of Birth:  Apr 30, 1966      BSA:          2.693 m Patient Age:    58 years      BP:           133/71 mmHg Patient Gender: F             HR:           80 bpm. Exam Location:  Inpatient Procedure: 2D Echo and Intracardiac Opacification Agent (Both Spectral and Color            Flow Doppler were utilized during procedure). Indications:    CHF  History:         Patient has prior history of Echocardiogram examinations.  Sonographer:    Charmaine Gaskins Referring Phys: [id number] ALLISON WOLFE  Sonographer Comments: Technically challenging study due to limited acoustic windows and Technically difficult study due to poor echo windows. Image acquisition challenging due to patient body habitus. IMPRESSIONS  1. Left ventricular ejection fraction, by estimation, is 55 to 60%. The left ventricle has normal function. The left ventricle has no regional wall motion abnormalities. Left ventricular diastolic parameters were normal.  2. Right ventricular systolic function is normal. The right ventricular size is normal.  3. Left atrial size was mildly dilated.  4. The mitral valve is abnormal. Trivial mitral valve regurgitation. No evidence of mitral stenosis.  5. The aortic valve is tricuspid. Aortic valve regurgitation is not visualized. No aortic stenosis is present.  6. The inferior vena cava is normal in size with greater than 50% respiratory variability, suggesting right atrial pressure of 3 mmHg. FINDINGS  Left Ventricle: Left ventricular ejection fraction, by estimation, is 55 to 60%. The left ventricle has normal function. The left ventricle has no regional wall motion abnormalities. Definity contrast agent was given IV to delineate the left ventricular  endocardial borders. Strain was performed and the global longitudinal strain is indeterminate. The left ventricular internal cavity size was normal in size. There is no left ventricular hypertrophy. Left ventricular diastolic parameters were normal. Right Ventricle: The right ventricular size is normal. No increase in right ventricular wall thickness. Right ventricular systolic function is normal. Left Atrium: Left atrial size was mildly dilated. Right Atrium: Right atrial size was normal in size. Pericardium: There is no evidence  of pericardial effusion. Mitral Valve: The mitral valve is abnormal. There is mild thickening of the  mitral valve leaflet(s). There is mild calcification of the mitral valve leaflet(s). Trivial mitral valve regurgitation. No evidence of mitral valve stenosis. Tricuspid Valve: The tricuspid valve is normal in structure. Tricuspid valve regurgitation is not demonstrated. No evidence of tricuspid stenosis. Aortic Valve: The aortic valve is tricuspid. Aortic valve regurgitation is not visualized. No aortic stenosis is present. Pulmonic Valve: The pulmonic valve was normal in structure. Pulmonic valve regurgitation is trivial. No evidence of pulmonic stenosis. Aorta: The aortic root is normal in size and structure. Venous: The inferior vena cava is normal in size with greater than 50% respiratory variability, suggesting right atrial pressure of 3 mmHg. IAS/Shunts: No atrial level shunt detected by color flow Doppler. Additional Comments: 3D was performed not requiring image post processing on an independent workstation and was indeterminate.  LEFT VENTRICLE PLAX 2D LVIDd:         4.60 cm   Diastology LVIDs:         3.30 cm   LV e' medial:    7.83 cm/s LV PW:         1.00 cm   LV E/e' medial:  14.2 LV IVS:        1.10 cm   LV e' lateral:   10.70 cm/s LVOT diam:     2.30 cm   LV E/e' lateral: 10.4 LVOT Area:     4.15 cm  RIGHT VENTRICLE RV S prime:     9.79 cm/s LEFT ATRIUM           Index LA diam:      3.90 cm 1.45 cm/m LA Vol (A4C): 72.2 ml 26.81 ml/m   AORTA Ao Root diam: 3.70 cm Ao Asc diam:  3.30 cm MITRAL VALVE MV Area (PHT): 3.81 cm     SHUNTS MV Decel Time: 199 msec     Systemic Diam: 2.30 cm MV E velocity: 111.00 cm/s MV A velocity: 96.70 cm/s MV E/A ratio:  1.15 Maude Emmer MD Electronically signed by Maude Emmer MD Signature Date/Time: 08/07/2024/9:57:57 AM    Final    CT Angio Chest PE W and/or Wo Contrast Result Date: 08/06/2024 CLINICAL DATA:  Pulmonary embolism (PE) suspected, low to intermediate prob, positive D-dimer; Sepsis right sided abdominal pain, dysuria, sepsis. History of squamous cell  carcinoma of the vagina. * Tracking Code: BO * EXAM: CT ANGIOGRAPHY CHEST CT ABDOMEN AND PELVIS WITH CONTRAST TECHNIQUE: Multidetector CT imaging of the chest was performed using the standard protocol during bolus administration of intravenous contrast. Multiplanar CT image reconstructions and MIPs were obtained to evaluate the vascular anatomy. Multidetector CT imaging of the abdomen and pelvis was performed using the standard protocol during bolus administration of intravenous contrast. RADIATION DOSE REDUCTION: This exam was performed according to the departmental dose-optimization program which includes automated exposure control, adjustment of the mA and/or kV according to patient size and/or use of iterative reconstruction technique. CONTRAST:  OMNIPAQUE  IOHEXOL  350 MG/ML SOLN COMPARISON:  CT venogram abdomen and pelvis from 03/16/2024 and nuclear medicine PET scan from 09/15/2020. FINDINGS: CTA CHEST FINDINGS Cardiovascular: No evidence of embolism to the proximal subsegmental pulmonary artery level. Normal cardiac size. No pericardial effusion. No aortic aneurysm. There are coronary artery calcifications, in keeping with coronary artery disease. There are also mild peripheral atherosclerotic vascular calcifications of thoracic aorta and its major branches. There is dilation of the main pulmonary trunk measuring up to  3.2 cm, which is nonspecific but can be seen with pulmonary artery hypertension. Mediastinum/Nodes: Visualized thyroid gland appears grossly unremarkable. No solid / cystic mediastinal masses. The esophagus is nondistended precluding optimal assessment. There are few mildly prominent mediastinal lymph nodes, which do not meet the size criteria for lymphadenopathy and though indeterminate most likely benign in etiology. No axillary or hilar lymphadenopathy by size criteria. Lungs/Pleura: The central tracheo-bronchial tree is patent. There are heterogeneous airspace opacities throughout the  right lung without volume loss, compatible with multilobar pneumonia. Follow-up to clearing is recommended. There is mosaic attenuation of predominantly left lung, consistent with heterogeneous air trapping related to small airways disease. No mass or consolidation. No pleural effusion or pneumothorax. No suspicious lung nodules. There is a stable sub 6 mm nodule in the right lung lower lobe (series 18, image 11), which is unchanged since the prior study from 12/10/2022. Musculoskeletal: The visualized soft tissues of the chest wall are grossly unremarkable. No suspicious osseous lesions. There are mild multilevel degenerative changes in the visualized spine. Review of the MIP images confirms the above findings. CT ABDOMEN and PELVIS FINDINGS Hepatobiliary: The liver is normal in size. Non-cirrhotic configuration. No suspicious mass. No intrahepatic or extrahepatic bile duct dilation. Gallbladder is surgically absent. Pancreas: Unremarkable. No pancreatic ductal dilatation or surrounding inflammatory changes. Spleen: Within normal limits. No focal lesion. Adrenals/Urinary Tract: Adrenal glands are unremarkable. No suspicious renal mass. There is a partially exophytic 3.6 x 4.0 cm cyst arising from the right kidney upper pole. There is a 2-3 mm nonobstructing calculus in the right kidney upper pole. No other nephroureterolithiasis or obstructive uropathy on either side. Urinary bladder is under distended, precluding optimal assessment. However, no large mass or stones identified. No perivesical fat stranding. Stomach/Bowel: There is a small diverticulum arising from the second part of duodenum. No disproportionate dilation of the small or large bowel loops. No evidence of abnormal bowel wall thickening or inflammatory changes. The appendix is unremarkable. Vascular/Lymphatic: No ascites or pneumoperitoneum. No abdominal or pelvic lymphadenopathy, by size criteria. No aneurysmal dilation of the major abdominal  arteries. There are mild peripheral atherosclerotic vascular calcifications of the aorta and its major branches. Reproductive: The uterus is surgically absent. No large adnexal mass. Other: There is a tiny fat containing umbilical hernia. The soft tissues and abdominal wall are otherwise unremarkable. Musculoskeletal: No suspicious osseous lesions. There are mild multilevel degenerative changes in the visualized spine. Redemonstration of mild anterior wedging deformity of L2 vertebral body, similar to the prior study. There is a stable hemangioma in the posterior aspect of L1 vertebral body. Review of the MIP images confirms the above findings. IMPRESSION: 1. No embolism to the proximal subsegmental pulmonary artery level. 2. Multilobar pneumonia involving the right lung. Follow-up to clearing is recommended. 3. No acute inflammatory process identified within the abdomen or pelvis. 4. No metastatic disease identified within the chest, abdomen or pelvis. 5. Multiple other nonacute observations, as described above. Aortic Atherosclerosis (ICD10-I70.0). Electronically Signed   By: Ree Molt M.D.   On: 08/06/2024 13:56   CT ABDOMEN PELVIS W CONTRAST Result Date: 08/06/2024 CLINICAL DATA:  Pulmonary embolism (PE) suspected, low to intermediate prob, positive D-dimer; Sepsis right sided abdominal pain, dysuria, sepsis. History of squamous cell carcinoma of the vagina. * Tracking Code: BO * EXAM: CT ANGIOGRAPHY CHEST CT ABDOMEN AND PELVIS WITH CONTRAST TECHNIQUE: Multidetector CT imaging of the chest was performed using the standard protocol during bolus administration of intravenous contrast. Multiplanar CT image reconstructions  and MIPs were obtained to evaluate the vascular anatomy. Multidetector CT imaging of the abdomen and pelvis was performed using the standard protocol during bolus administration of intravenous contrast. RADIATION DOSE REDUCTION: This exam was performed according to the departmental  dose-optimization program which includes automated exposure control, adjustment of the mA and/or kV according to patient size and/or use of iterative reconstruction technique. CONTRAST:  OMNIPAQUE  IOHEXOL  350 MG/ML SOLN COMPARISON:  CT venogram abdomen and pelvis from 03/16/2024 and nuclear medicine PET scan from 09/15/2020. FINDINGS: CTA CHEST FINDINGS Cardiovascular: No evidence of embolism to the proximal subsegmental pulmonary artery level. Normal cardiac size. No pericardial effusion. No aortic aneurysm. There are coronary artery calcifications, in keeping with coronary artery disease. There are also mild peripheral atherosclerotic vascular calcifications of thoracic aorta and its major branches. There is dilation of the main pulmonary trunk measuring up to 3.2 cm, which is nonspecific but can be seen with pulmonary artery hypertension. Mediastinum/Nodes: Visualized thyroid gland appears grossly unremarkable. No solid / cystic mediastinal masses. The esophagus is nondistended precluding optimal assessment. There are few mildly prominent mediastinal lymph nodes, which do not meet the size criteria for lymphadenopathy and though indeterminate most likely benign in etiology. No axillary or hilar lymphadenopathy by size criteria. Lungs/Pleura: The central tracheo-bronchial tree is patent. There are heterogeneous airspace opacities throughout the right lung without volume loss, compatible with multilobar pneumonia. Follow-up to clearing is recommended. There is mosaic attenuation of predominantly left lung, consistent with heterogeneous air trapping related to small airways disease. No mass or consolidation. No pleural effusion or pneumothorax. No suspicious lung nodules. There is a stable sub 6 mm nodule in the right lung lower lobe (series 18, image 11), which is unchanged since the prior study from 12/10/2022. Musculoskeletal: The visualized soft tissues of the chest wall are grossly unremarkable. No  suspicious osseous lesions. There are mild multilevel degenerative changes in the visualized spine. Review of the MIP images confirms the above findings. CT ABDOMEN and PELVIS FINDINGS Hepatobiliary: The liver is normal in size. Non-cirrhotic configuration. No suspicious mass. No intrahepatic or extrahepatic bile duct dilation. Gallbladder is surgically absent. Pancreas: Unremarkable. No pancreatic ductal dilatation or surrounding inflammatory changes. Spleen: Within normal limits. No focal lesion. Adrenals/Urinary Tract: Adrenal glands are unremarkable. No suspicious renal mass. There is a partially exophytic 3.6 x 4.0 cm cyst arising from the right kidney upper pole. There is a 2-3 mm nonobstructing calculus in the right kidney upper pole. No other nephroureterolithiasis or obstructive uropathy on either side. Urinary bladder is under distended, precluding optimal assessment. However, no large mass or stones identified. No perivesical fat stranding. Stomach/Bowel: There is a small diverticulum arising from the second part of duodenum. No disproportionate dilation of the small or large bowel loops. No evidence of abnormal bowel wall thickening or inflammatory changes. The appendix is unremarkable. Vascular/Lymphatic: No ascites or pneumoperitoneum. No abdominal or pelvic lymphadenopathy, by size criteria. No aneurysmal dilation of the major abdominal arteries. There are mild peripheral atherosclerotic vascular calcifications of the aorta and its major branches. Reproductive: The uterus is surgically absent. No large adnexal mass. Other: There is a tiny fat containing umbilical hernia. The soft tissues and abdominal wall are otherwise unremarkable. Musculoskeletal: No suspicious osseous lesions. There are mild multilevel degenerative changes in the visualized spine. Redemonstration of mild anterior wedging deformity of L2 vertebral body, similar to the prior study. There is a stable hemangioma in the posterior  aspect of L1 vertebral body. Review of the MIP images  confirms the above findings. IMPRESSION: 1. No embolism to the proximal subsegmental pulmonary artery level. 2. Multilobar pneumonia involving the right lung. Follow-up to clearing is recommended. 3. No acute inflammatory process identified within the abdomen or pelvis. 4. No metastatic disease identified within the chest, abdomen or pelvis. 5. Multiple other nonacute observations, as described above. Aortic Atherosclerosis (ICD10-I70.0). Electronically Signed   By: Ree Molt M.D.   On: 08/06/2024 13:56   DG Chest Portable 1 View Result Date: 08/06/2024 CLINICAL DATA:  Shortness of breath, hypoxia, sepsis. EXAM: PORTABLE CHEST 1 VIEW COMPARISON:  01/19/2024 FINDINGS: Stable enlarged cardiac silhouette. Stable mild diffuse chronic interstitial prominence. Mild increase in prominence of the pulmonary vasculature. Interval mild patchy density in the right mid and lower lung zones. Unremarkable bones. IMPRESSION: 1. Interval mild pneumonia in the right mid and lower lung zones. 2. Interval mild pulmonary vascular congestion. 3. Stable cardiomegaly and mild chronic interstitial lung disease. Electronically Signed   By: Elspeth Bathe M.D.   On: 08/06/2024 09:00    Microbiology: Results for orders placed or performed during the hospital encounter of 08/06/24  Resp panel by RT-PCR (RSV, Flu A&B, Covid) Anterior Nasal Swab     Status: None   Collection Time: 08/06/24  8:35 AM   Specimen: Anterior Nasal Swab  Result Value Ref Range Status   SARS Coronavirus 2 by RT PCR NEGATIVE NEGATIVE Final    Comment: (NOTE) SARS-CoV-2 target nucleic acids are NOT DETECTED.  The SARS-CoV-2 RNA is generally detectable in upper respiratory specimens during the acute phase of infection. The lowest concentration of SARS-CoV-2 viral copies this assay can detect is 138 copies/mL. A negative result does not preclude SARS-Cov-2 infection and should not be used as the  sole basis for treatment or other patient management decisions. A negative result may occur with  improper specimen collection/handling, submission of specimen other than nasopharyngeal swab, presence of viral mutation(s) within the areas targeted by this assay, and inadequate number of viral copies(<138 copies/mL). A negative result must be combined with clinical observations, patient history, and epidemiological information. The expected result is Negative.  Fact Sheet for Patients:  BloggerCourse.com  Fact Sheet for Healthcare Providers:  SeriousBroker.it  This test is no t yet approved or cleared by the [place]  FDA and  has been authorized for detection and/or diagnosis of SARS-CoV-2 by FDA under an Emergency Use Authorization (EUA). This EUA will remain  in effect (meaning this test can be used) for the duration of the COVID-19 declaration under Section 564(b)(1) of the Act, 21 U.S.C.section 360bbb-3(b)(1), unless the authorization is terminated  or revoked sooner.       Influenza A by PCR NEGATIVE NEGATIVE Final   Influenza B by PCR NEGATIVE NEGATIVE Final    Comment: (NOTE) The Xpert Xpress SARS-CoV-2/FLU/RSV plus assay is intended as an aid in the diagnosis of influenza from Nasopharyngeal swab specimens and should not be used as a sole basis for treatment. Nasal washings and aspirates are unacceptable for Xpert Xpress SARS-CoV-2/FLU/RSV testing.  Fact Sheet for Patients: BloggerCourse.com  Fact Sheet for Healthcare Providers: SeriousBroker.it  This test is not yet approved or cleared by the [place]  FDA and has been authorized for detection and/or diagnosis of SARS-CoV-2 by FDA under an Emergency Use Authorization (EUA). This EUA will remain in effect (meaning this test can be used) for the duration of the COVID-19 declaration under Section 564(b)(1) of the  Act, 21 U.S.C. section 360bbb-3(b)(1), unless the authorization is terminated or revoked.  Resp Syncytial Virus by PCR NEGATIVE NEGATIVE Final    Comment: (NOTE) Fact Sheet for Patients: BloggerCourse.com  Fact Sheet for Healthcare Providers: SeriousBroker.it  This test is not yet approved or cleared by the [place]  FDA and has been authorized for detection and/or diagnosis of SARS-CoV-2 by FDA under an Emergency Use Authorization (EUA). This EUA will remain in effect (meaning this test can be used) for the duration of the COVID-19 declaration under Section 564(b)(1) of the Act, 21 U.S.C. section 360bbb-3(b)(1), unless the authorization is terminated or revoked.  Performed at [facility], [address] [place], [place] [address]   Blood Culture (routine x 2)     Status: None (Preliminary result)   Collection Time: 08/06/24  8:35 AM   Specimen: BLOOD LEFT HAND  Result Value Ref Range Status   Specimen Description   Final    BLOOD LEFT HAND Performed at [facility], [address] [place], [place] [address]    Special Requests   Final    BOTTLES DRAWN AEROBIC AND ANAEROBIC Blood Culture results may not be optimal due to an inadequate volume of blood received in culture bottles Performed at [facility], [address] [place], [place] [address]    Culture   Final    NO GROWTH 4 DAYS Performed at [facility], [address] [place], [place] [address]    Report Status PENDING  Incomplete  Respiratory (~20 pathogens) panel by PCR     Status: Abnormal   Collection Time: 08/06/24  8:35 AM   Specimen: Nasopharyngeal Swab; Respiratory  Result Value Ref Range Status   Adenovirus NOT DETECTED NOT DETECTED Final   Coronavirus 229E NOT DETECTED NOT DETECTED Final    Comment: (NOTE) The Coronavirus on the Respiratory Panel, DOES NOT test for the novel  Coronavirus  (2019 nCoV)    Coronavirus HKU1 NOT DETECTED NOT DETECTED Final   Coronavirus NL63 NOT DETECTED NOT DETECTED Final   Coronavirus OC43 NOT DETECTED NOT DETECTED Final   Metapneumovirus NOT DETECTED NOT DETECTED Final   Rhinovirus / Enterovirus DETECTED (A) NOT DETECTED Final   Influenza A NOT DETECTED NOT DETECTED Final   Influenza B NOT DETECTED NOT DETECTED Final   Parainfluenza Virus 1 NOT DETECTED NOT DETECTED Final   Parainfluenza Virus 2 NOT DETECTED NOT DETECTED Final   Parainfluenza Virus 3 NOT DETECTED NOT DETECTED Final   Parainfluenza Virus 4 NOT DETECTED NOT DETECTED Final   Respiratory Syncytial Virus NOT DETECTED NOT DETECTED Final   Bordetella pertussis NOT DETECTED NOT DETECTED Final   Bordetella Parapertussis NOT DETECTED NOT DETECTED Final   Chlamydophila pneumoniae NOT DETECTED NOT DETECTED Final   Mycoplasma pneumoniae NOT DETECTED NOT DETECTED Final    Comment: Performed at [facility], [address] [place], [place] [address]  Blood Culture (routine x 2)     Status: None (Preliminary result)   Collection Time: 08/06/24  8:49 AM   Specimen: BLOOD RIGHT HAND  Result Value Ref Range Status   Specimen Description   Final    BLOOD RIGHT HAND Performed at [facility], [address], [place], [place] [address]    Special Requests   Final    BOTTLES DRAWN AEROBIC AND ANAEROBIC Blood Culture results may not be optimal due to an inadequate volume of blood received in culture bottles Performed at [facility], [address] [place], [place] [address]    Culture  Final    NO GROWTH 4 DAYS Performed at [facility], [address], [place], [place] [address]    Report Status PENDING  Incomplete  Urine Culture     Status: Abnormal   Collection Time: 08/06/24  1:01 PM   Specimen: Urine, Random  Result Value Ref Range Status   Specimen Description   Final    URINE, RANDOM Performed at [facility], [address] [place], [place] [address]    Special Requests   Final    NONE Reflexed from [id number] Performed at [facility], [address], [place], [place] [address]    Culture (A)  Final    60,000 COLONIES/mL ESCHERICHIA COLI 40,000 COLONIES/mL VIRIDANS STREPTOCOCCUS Standardized susceptibility testing for this organism is not available. Performed at [facility], [address] [place], [place] [address]    Report Status 08/09/2024 FINAL  Final   Organism ID, Bacteria ESCHERICHIA COLI (A)  Final      Susceptibility   Escherichia coli - MIC*    AMPICILLIN 4 SENSITIVE Sensitive     CEFAZOLIN  (URINE) Value in next row Sensitive      <=1 SENSITIVEThis is a modified FDA-approved test that has been validated and its performance characteristics determined by the reporting laboratory.  This laboratory is certified under the Clinical Laboratory Improvement Amendments CLIA as qualified to perform high complexity clinical laboratory testing.    CEFEPIME  Value in next row Sensitive      <=1 SENSITIVEThis is a modified FDA-approved test that has been validated and its performance characteristics determined by the reporting laboratory.  This laboratory is certified under the Clinical Laboratory Improvement Amendments CLIA as qualified to perform high complexity clinical laboratory testing.    ERTAPENEM  Value in next row Sensitive      <=1 SENSITIVEThis is a modified FDA-approved test that has been validated and its performance characteristics determined by the reporting laboratory.  This laboratory is certified under the Clinical Laboratory Improvement Amendments CLIA as qualified to perform high complexity clinical laboratory testing.    CEFTRIAXONE  Value in next row Sensitive      <=1 SENSITIVEThis is a modified FDA-approved test that has been validated and its performance characteristics determined by the reporting laboratory.  This laboratory is certified under the Clinical  Laboratory Improvement Amendments CLIA as qualified to perform high complexity clinical laboratory testing.    CIPROFLOXACIN  Value in next row Sensitive      <=1 SENSITIVEThis is a modified FDA-approved test that has been validated and its performance characteristics determined by the reporting laboratory.  This laboratory is certified under the Clinical Laboratory Improvement Amendments CLIA as qualified to perform high complexity clinical laboratory testing.    GENTAMICIN  Value in next row Sensitive      <=1 SENSITIVEThis is a modified FDA-approved test that has been validated and its performance characteristics determined by the reporting laboratory.  This laboratory is certified under the Clinical Laboratory Improvement Amendments CLIA as qualified to perform high complexity clinical laboratory testing.    NITROFURANTOIN  Value in next row Sensitive      <=1 SENSITIVEThis is a modified FDA-approved test that has been validated and its performance characteristics determined by the reporting laboratory.  This laboratory is certified under the Clinical Laboratory Improvement Amendments CLIA as qualified to perform high complexity clinical laboratory testing.    TRIMETH /SULFA  Value in next row Sensitive      <=1 SENSITIVEThis is a modified FDA-approved test that has  been validated and its performance characteristics determined by the reporting laboratory.  This laboratory is certified under the Clinical Laboratory Improvement Amendments CLIA as qualified to perform high complexity clinical laboratory testing.    AMPICILLIN/SULBACTAM Value in next row Sensitive      <=1 SENSITIVEThis is a modified FDA-approved test that has been validated and its performance characteristics determined by the reporting laboratory.  This laboratory is certified under the Clinical Laboratory Improvement Amendments CLIA as qualified to perform high complexity clinical laboratory testing.    PIP/TAZO Value in next row Sensitive       <=4 SENSITIVEThis is a modified FDA-approved test that has been validated and its performance characteristics determined by the reporting laboratory.  This laboratory is certified under the Clinical Laboratory Improvement Amendments CLIA as qualified to perform high complexity clinical laboratory testing.    MEROPENEM Value in next row Sensitive      <=4 SENSITIVEThis is a modified FDA-approved test that has been validated and its performance characteristics determined by the reporting laboratory.  This laboratory is certified under the Clinical Laboratory Improvement Amendments CLIA as qualified to perform high complexity clinical laboratory testing.    * 60,000 COLONIES/mL ESCHERICHIA COLI   *Note: Due to a large number of results and/or encounters for the requested time period, some results have not been displayed. A complete set of results can be found in Results Review.    Labs: CBC: Recent Labs  Lab 08/06/24 0835 08/06/24 0848 08/07/24 0540 08/09/24 0831  WBC 13.4*  --  8.4 4.1  NEUTROABS 12.2*  --   --   --   HGB 12.1 12.2 10.8* 11.9*  HCT 38.0 36.0 34.2* 38.0  MCV 93.6  --  96.3 94.3  PLT 194  --  161 210   Basic Metabolic Panel: Recent Labs  Lab 08/06/24 0835 08/06/24 0848 08/07/24 0540 08/09/24 0831  NA 140 140 138 139  K 4.3 4.2 4.2 4.4  CL 105 104 103 102  CO2 25  --  29 28  GLUCOSE 151* 153* 94 99  BUN 13 11 10 13   CREATININE 0.73 0.70 0.60 0.55  CALCIUM 8.7*  --  8.5* 9.1   Liver Function Tests: Recent Labs  Lab 08/06/24 0835  AST 36  ALT 28  ALKPHOS 139*  BILITOT 0.9  PROT 6.4*  ALBUMIN 3.6   CBG: No results for input(s): GLUCAP in the last 168 hours.  Discharge time spent: greater than 30 minutes.  Signed: Owen DELENA Lore, MD [facility] 08/10/2024

## 2024-08-10 NOTE — TOC Transition Note (Addendum)
 Transition of Care [facility]) - Discharge Note   Patient Details  Name: Jordan [name] MRN: [id number] Date of Birth: May 17, 1966  Transition of Care [facility]) CM/SW Contact:  Bascom Service, RN Phone Number: 08/10/2024, 2:18 PM   Clinical Narrative: Patient declines ST SNF-wants return back to [facility] accepts back, already uses w/c. Ordered for home 02-[employer] rep Jermaine to deliver home 02 to [facility] prior patient d/c to [facility] will call nsg on floor once home 02 delivered to facility-provided with Rexene tel#[phone], & charge nurse Lonell tel#[phone] to contact they will call  me up till 5p if after 5p Rexene or Lonell will call PTAR once confirmed.PTAR once confirmed dme home 02 @ [facility]. PTAR forms @ nsg station. -5:31p Received call from nsg dme home 02 in facility. PTAR called  no further CM needs.    Final next level of care: Assisted Living Barriers to Discharge: No Barriers Identified   Patient Goals and CMS Choice Patient states their goals for this hospitalization and ongoing recovery are:: [facility] CMS Medicare.gov Compare Post Acute Care list provided to:: Patient Choice offered to / list presented to : Patient [facility] ownership interest in [facility].provided to:: Patient    Discharge Placement              Patient chooses bed at:  [facility]) Patient to be transferred to facility by: PTAR Name of family member notified: Patient Patient and family notified of of transfer: 08/10/24  Discharge Plan and Services Additional resources added to the After Visit Summary for     Discharge Planning Services: CM Consult Post Acute Care Choice: Durable Medical Equipment (Home 02-[employer]-deliver to [facility] prior patient d/c to [facility])                               Social Drivers of Health (SDOH) Interventions SDOH Screenings   Food Insecurity: Food Insecurity Present  (02/02/2024)  Housing: Low Risk  (12/23/2023)  Transportation Needs: No Transportation Needs (12/23/2023)  Utilities: Not At Risk (12/23/2023)  Depression (PHQ2-9): Low Risk  (07/16/2023)  Tobacco Use: Low Risk  (08/06/2024)     Readmission Risk Interventions    12/26/2023    3:11 PM 12/26/2023    2:48 PM 12/25/2023   10:59 AM  Readmission Risk Prevention Plan  Transportation Screening Complete Complete Complete  PCP or Specialist Appt within 5-7 Days Complete Complete Complete  Home Care Screening  Complete Complete  Medication Review (RN CM)  Complete Complete

## 2024-08-11 LAB — CULTURE, BLOOD (ROUTINE X 2)
Culture: NO GROWTH
Culture: NO GROWTH

## 2024-08-16 ENCOUNTER — Encounter: Payer: Self-pay | Admitting: Family Medicine

## 2024-08-16 ENCOUNTER — Ambulatory Visit: Payer: MEDICAID | Admitting: Family Medicine

## 2024-08-16 VITALS — BP 123/75 | HR 87

## 2024-08-16 DIAGNOSIS — R300 Dysuria: Secondary | ICD-10-CM | POA: Diagnosis not present

## 2024-08-16 DIAGNOSIS — N3090 Cystitis, unspecified without hematuria: Secondary | ICD-10-CM | POA: Diagnosis not present

## 2024-08-16 DIAGNOSIS — N3941 Urge incontinence: Secondary | ICD-10-CM | POA: Diagnosis not present

## 2024-08-16 LAB — POCT URINALYSIS DIP (DEVICE)
Bilirubin Urine: NEGATIVE
Glucose, UA: NEGATIVE mg/dL
Hgb urine dipstick: NEGATIVE
Ketones, ur: NEGATIVE mg/dL
Nitrite: NEGATIVE
Protein, ur: NEGATIVE mg/dL
Specific Gravity, Urine: 1.02 (ref 1.005–1.030)
Urobilinogen, UA: 0.2 mg/dL (ref 0.0–1.0)
pH: 6.5 (ref 5.0–8.0)

## 2024-08-16 NOTE — Assessment & Plan Note (Signed)
 Patient with long history of urge incontinence.  Previously saw pelvic floor therapy and reported that it helped.  She would like a another referral to see them.  Referral placed.

## 2024-08-16 NOTE — Progress Notes (Signed)
    Subjective:  Jordan [name] is a 58 y.o. female who presents to the clinic today for recurrent cystitis  HPI:  Patient with recurrent cystitis.  Reports she was recently hospitalized and diagnosed with pneumonia as well as a UTI.  She was started on antibiotics.  She was hospitalized for 3 days.  Is currently taking cefadroxil.  Reports that she is still having burning with urination as well as increased urinary frequency and issues with urge incontinence.  Reports that she frequently has to go to the restroom and has previously seen pelvic floor therapy and felt like that helped with her ability to hold her urine.  She has not seen pelvic floor therapy, her urologist, or her urogynecologist in several years per patient's report.  Patient denies any other issues at this time.  Objective:  Physical Exam: BP 123/75   Pulse 87   LMP  (LMP Unknown)   General: Alert, well-appearing, no acute distress Cardiac: Regular rate Respiratory: Normal work of breathing, speaking in full sentences, on 2 L nasal cannula O2 Abdomen: Soft, nontender MSK: Trace edema of lower extremities bilaterally, in a wheelchair but is able to ambulate  Results for orders placed or performed in visit on 07/26/24 (from the past 72 hours)  POCT urinalysis dip (device)     Status: Abnormal   Collection Time: 08/16/24 12:45 PM  Result Value Ref Range   Glucose, UA NEGATIVE NEGATIVE mg/dL   Bilirubin Urine NEGATIVE NEGATIVE   Ketones, ur NEGATIVE NEGATIVE mg/dL   Specific Gravity, Urine 1.020 1.005 - 1.030   Hgb urine dipstick NEGATIVE NEGATIVE   pH 6.5 5.0 - 8.0   Protein, ur NEGATIVE NEGATIVE mg/dL   Urobilinogen, UA 0.2 0.0 - 1.0 mg/dL   Nitrite NEGATIVE NEGATIVE   Leukocytes,Ua SMALL (A) NEGATIVE    Comment: Biochemical Testing Only. Please order routine urinalysis from main lab if confirmatory testing is needed.   *Note: Due to a large number of results and/or encounters for the requested time period, some  results have not been displayed. A complete set of results can be found in Results Review.     Assessment/Plan:  Cystitis Patient with chronic issues of recurrent cystitis.  Has seen urology and urogynecology in the past but reports that it has been many years.  Has also seen pelvic floor therapy.  Recently hospitalized for pneumonia and also told she had a UTI.  She was started on cefadroxil.  Is currently taking it and reports she is still having symptoms.  Urinalysis showing small leukocytes but otherwise within normal limits.  Will send for urine culture to ensure current antibiotic regiment is adequately treating UTI.  Referral placed for urogynecology.  Urge incontinence Patient with long history of urge incontinence.  Previously saw pelvic floor therapy and reported that it helped.  She would like a another referral to see them.  Referral placed.   Lab Orders  No laboratory test(s) ordered today    No orders of the defined types were placed in this encounter.     Steffan Rover, MD Attending Family Medicine Physician, [facility] for [facility]   08/16/24 1:59 PM

## 2024-08-16 NOTE — Assessment & Plan Note (Signed)
 Patient with chronic issues of recurrent cystitis.  Has seen urology and urogynecology in the past but reports that it has been many years.  Has also seen pelvic floor therapy.  Recently hospitalized for pneumonia and also told she had a UTI.  She was started on cefadroxil.  Is currently taking it and reports she is still having symptoms.  Urinalysis showing small leukocytes but otherwise within normal limits.  Will send for urine culture to ensure current antibiotic regiment is adequately treating UTI.  Referral placed for urogynecology.

## 2024-08-19 LAB — URINE CULTURE

## 2024-08-23 ENCOUNTER — Telehealth: Payer: Self-pay | Admitting: Clinical

## 2024-08-23 NOTE — Telephone Encounter (Signed)
 Received a call from patient stating she wanted her visit to be in person. After speaking to Lolita, she was okay to have an in person visit. Called patient to give her the information, and was not able to reach her. Message left that it was fine foe her to come in the office.

## 2024-08-31 ENCOUNTER — Telehealth: Payer: Self-pay

## 2024-08-31 NOTE — Telephone Encounter (Signed)
 Returned her call and left a message with next appt date/ time at [facility].

## 2024-09-01 NOTE — BH Specialist Note (Signed)
 Integrated Behavioral Health Follow Up In-Person Visit  MRN: [id number] Name: Jordan [name]  Number of Integrated Behavioral Health Clinician visits: Additional Visit  Session Start time: 1416   Session End time: 1529  Total time in minutes: 73  Types of Service: Individual psychotherapy  Interpretor:No. Interpretor Name and Language: n/a  Subjective: Jordan [name] is a 58 y.o. female accompanied by n/a Patient was referred by Gloris Hugger, MD for emotional distress. Patient reports the following symptoms/concerns: Processing recent hospitalization, as well as finding that her half-sister reached out to pt's daughter via letter, after many years of estrangement; pt felt abandoned by her siblings after the loss of their parents, followed by the birth of her daughter with no support from family all these years. Pt is happy to hear that her daughter is making preparations to  bring her back to [place]  permanently, sometime in the next 4-6 months; holding onto hope she will see her daughter again in-person.  Duration of problem: Ongoing life stressors; Severity of problem: moderate  Objective: Mood: hopeful and Affect: Tearful Risk of harm to self or others: No plan to harm self or others  Life Context: Family and Social: Temporarily in assisted living facility with roommate School/Work: Disability Self-Care: Continued self-advocacy Life Changes: Loss of partner of 17 years (02/20/22); move to assisted living facility  Patient and/or Family's Strengths/Protective Factors: Social connections, Concrete supports in place (healthy food, safe environments, etc.), and Sense of purpose  Goals Addressed: Patient will:  Reduce symptoms of: agitation, anxiety, depression, mood instability, and stress   Increase knowledge and/or ability of: stress reduction   Demonstrate ability to: Increase motivation to adhere to plan of care  Progress towards  Goals: Ongoing  Interventions: Interventions utilized:  Motivational Interviewing and Supportive Reflection Standardized Assessments completed: Not Needed  Patient and/or Family Response: Patient agrees with treatment plan.  Patient Centered Plan: Patient is on the following Treatment Plan(s): IBH  Clinical Assessment/Diagnosis  No diagnosis found.   Patient may benefit from continued therapeutic intervention.  Plan: Follow up with behavioral health clinician on : Two weeks Behavioral recommendations:  -Continue taking BH medication as prescribed -Continue daily self-coping strategies (self-advocacy; breathing exercises; watching favorite shows on phone; maintain communication with supportive people in life; time for good memories of partner) -Consider forgiveness of past wrongs by siblings, for your own peace of mind (not theirs); will discuss further at upcoming visit Referral(s): Integrated [facility] (In Clinic)  Warren BROCKS [place], [place]     08/16/2024    1:37 PM 07/16/2023    5:18 PM 03/27/2023    4:22 PM 07/18/2022    1:43 PM 04/17/2022   11:19 AM  Depression screen PHQ 2/9  Decreased Interest 0 0 0 1 0  Down, Depressed, Hopeless 1 0 1 1 1   PHQ - 2 Score 1 0 1 2 1   Altered sleeping 1 0 2 1 1   Tired, decreased energy 0 0 1 1 0  Change in appetite 0 0 0 1 0  Feeling bad or failure about yourself  1 1 0 0 2  Trouble concentrating 0 1 1  3   Moving slowly or fidgety/restless 0 1 1 1  0  Suicidal thoughts 0 0 0 0 0  PHQ-9 Score 3  3  6  6  7       Data saved with a previous flowsheet row definition      08/16/2024    1:37 PM 07/16/2023    5:18 PM 03/27/2023  4:25 PM 07/18/2022    1:43 PM  GAD 7 : Generalized Anxiety Score  Nervous, Anxious, on Edge 0 1 2 1   Control/stop worrying 1 1 1 1   Worry too much - different things 1 1 1 1   Trouble relaxing 0 1 1 1   Restless 0 1 1 1   Easily annoyed or irritable 1 2 1 1   Afraid - awful might happen 1 0 1 0   Total GAD 7 Score 4 7 8  6

## 2024-09-02 ENCOUNTER — Ambulatory Visit: Payer: MEDICAID

## 2024-09-02 DIAGNOSIS — F3160 Bipolar disorder, current episode mixed, unspecified: Secondary | ICD-10-CM | POA: Diagnosis not present

## 2024-09-02 DIAGNOSIS — Z658 Other specified problems related to psychosocial circumstances: Secondary | ICD-10-CM

## 2024-09-02 DIAGNOSIS — F4381 Prolonged grief disorder: Secondary | ICD-10-CM

## 2024-09-06 ENCOUNTER — Other Ambulatory Visit: Payer: Self-pay | Admitting: Hematology and Oncology

## 2024-09-06 DIAGNOSIS — E538 Deficiency of other specified B group vitamins: Secondary | ICD-10-CM

## 2024-09-07 ENCOUNTER — Telehealth: Payer: Self-pay

## 2024-09-07 ENCOUNTER — Inpatient Hospital Stay: Payer: MEDICAID

## 2024-09-07 ENCOUNTER — Encounter: Payer: Self-pay | Admitting: Hematology and Oncology

## 2024-09-07 ENCOUNTER — Inpatient Hospital Stay: Payer: MEDICAID | Attending: Hematology and Oncology | Admitting: Hematology and Oncology

## 2024-09-07 ENCOUNTER — Telehealth: Payer: Self-pay | Admitting: Hematology and Oncology

## 2024-09-07 VITALS — BP 133/67 | HR 99 | Temp 98.8°F | Resp 18 | Ht 68.0 in | Wt 372.8 lb

## 2024-09-07 DIAGNOSIS — C52 Malignant neoplasm of vagina: Secondary | ICD-10-CM

## 2024-09-07 DIAGNOSIS — E538 Deficiency of other specified B group vitamins: Secondary | ICD-10-CM | POA: Diagnosis not present

## 2024-09-07 LAB — CBC WITH DIFFERENTIAL (CANCER CENTER ONLY)
Abs Immature Granulocytes: 0.02 K/uL (ref 0.00–0.07)
Basophils Absolute: 0 K/uL (ref 0.0–0.1)
Basophils Relative: 0 %
Eosinophils Absolute: 0 K/uL (ref 0.0–0.5)
Eosinophils Relative: 0 %
HCT: 40.3 % (ref 36.0–46.0)
Hemoglobin: 13.1 g/dL (ref 12.0–15.0)
Immature Granulocytes: 0 %
Lymphocytes Relative: 15 %
Lymphs Abs: 0.8 K/uL (ref 0.7–4.0)
MCH: 30.1 pg (ref 26.0–34.0)
MCHC: 32.5 g/dL (ref 30.0–36.0)
MCV: 92.6 fL (ref 80.0–100.0)
Monocytes Absolute: 0.4 K/uL (ref 0.1–1.0)
Monocytes Relative: 8 %
Neutro Abs: 4 K/uL (ref 1.7–7.7)
Neutrophils Relative %: 77 %
Platelet Count: 179 K/uL (ref 150–400)
RBC: 4.35 MIL/uL (ref 3.87–5.11)
RDW: 14 % (ref 11.5–15.5)
WBC Count: 5.2 K/uL (ref 4.0–10.5)
nRBC: 0 % (ref 0.0–0.2)

## 2024-09-07 LAB — VITAMIN B12: Vitamin B-12: 690 pg/mL (ref 180–914)

## 2024-09-07 MED ORDER — CYANOCOBALAMIN 1000 MCG/ML IJ SOLN
1000.0000 ug | Freq: Once | INTRAMUSCULAR | Status: AC
  Administered 2024-09-07: 1000 ug via INTRAMUSCULAR
  Filled 2024-09-07: qty 1

## 2024-09-07 NOTE — Telephone Encounter (Signed)
 left vm for pt about scheduled appts date and times. Encouraged to call back if need to reschedule

## 2024-09-07 NOTE — Telephone Encounter (Signed)
 Called and left below message. Ask her to call the office for questions. Left message scheduler would call to schedule appt in 6 months for labs, MD and injection.

## 2024-09-07 NOTE — Progress Notes (Signed)
 [facility] OFFICE PROGRESS NOTE  Patient, No Pcp Per  ASSESSMENT & PLAN:  Assessment & Plan Vitamin B12 deficiency I reviewed test result with her She has excellent response to vitamin B12 injection  CBC is normal Vitamin B12 level is pending She will proceed with injection today and I will call her with results of her vitamin B12 level If the result is still high, we will space out her next injection to 6 months Vaginal cancer (HCC) She has history of vaginal cancer, treated     Orders Placed This Encounter  Procedures   Vitamin B12    Standing Status:   Future    Expiration Date:   09/07/2025   CBC with Differential (Cancer Center Only)    Standing Status:   Future    Expiration Date:   09/07/2025    INTERVAL HISTORY: Patient returns for recurrent anemia Symptoms of anemia includes none We reviewed CBC result  SUMMARY OF HEMATOLOGIC HISTORY: : Please see my original consult note dated August 12, 2022 for further details Jordan [name] was seen here because of severe pancytopenia The patient is a poor historian She lives by herself She was noted to have abnormal CBC in 2016 requiring bone marrow aspirate and biopsy   On 02/20/2015, she underwent bone marrow aspirate and biopsy, ordered by Dr. Freddie.  The patient did not recall having the bone marrow biopsy done.  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 - SEE COMMENT. 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 No increase in blastic cells is identified by morphology or flow cytometric studies. The overall morphologic features may be due nutritional deficiency (vitamin B12, folate),  medication (antibiotics, anticonvulsants, etc.), liver disease, thyroid disease, toxins, alcohol. While myelodysplasia is also in the differential diagnosis, it is not favored at this time. Clinical and cytogenetic correlation is recommended.  At that time, her white blood cell count was 1.3, hemoglobin 7.0, MCV of 105.1 and platelet count of 90,000   At that time, her B12 level was normal Her most recent normal CBC dated August 13, 2020 showed white count of 6.0, hemoglobin 14.9 and platelet count 171,000 On October 08, 2021, her white blood cell count was 4.6, hemoglobin 14.4 and platelet count 126,000 On admission, on August 17, 2022, her white blood cell count was 1.3, hemoglobin 7.6, MCV 122 and platelet count of 71,000 This morning, it dropped further with a white count of 1.6, hemoglobin 6.4, MCV of 127.3 and platelet count of 62 She was receiving blood transfusion when I entered the room She denies dizziness, chest pain or shortness of breath She had not noticed any recent bleeding such as epistaxis, hematuria or hematochezia The patient denies over the counter NSAID ingestion. She is not on antiplatelets agents. She has never have endoscopy evaluation She denies any pica and eats a variety of diet.   Of note, vitamin B12 level was drawn in the emergency department and it came back less than 50 She denies smoking or alcohol intake Denies over-the-counter supplement   She has history of vaginal cancer status post radiation treatment Her last radiation treatment was in 2020 in which she was evaluated by Dr. Shannon on July 23, 2022 She has chronic discomfort, vaginal discharge and severe dysuria since  completion of radiation treatment, and was prescribed Diflucan  She continues to have chronic symptoms of dysuria for a long time She started receiving high doses of vitamin B12 injection since 2023 Her vitamin B12 level is then spaced to every 3 months in 2024-2025  Lab Results   Component Value Date   VITAMINB12 546 09/01/2023   FERRITIN 618 (H) 01/05/2015   HGB 13.1 09/07/2024   RBC 4.35 09/07/2024   Vitals:   09/07/24 1207  BP: 133/67  Pulse: 99  Resp: 18  Temp: 98.8 F (37.1 C)  SpO2: 92%

## 2024-09-07 NOTE — Assessment & Plan Note (Addendum)
 I reviewed test result with her She has excellent response to vitamin B12 injection  CBC is normal Vitamin B12 level is pending She will proceed with injection today and I will call her with results of her vitamin B12 level If the result is still high, we will space out her next injection to 6 months

## 2024-09-07 NOTE — Telephone Encounter (Signed)
-----   Message from Jordan [name] sent at 09/07/2024  2:55 PM EST ----- Her b12 is good Please call her Please schedule labs, see me and inj in 6 months Thanks

## 2024-09-07 NOTE — Assessment & Plan Note (Addendum)
 She has history of vaginal cancer, treated

## 2024-09-08 ENCOUNTER — Encounter: Payer: Self-pay | Admitting: General Practice

## 2024-09-08 ENCOUNTER — Telehealth: Payer: Self-pay

## 2024-09-08 NOTE — Telephone Encounter (Signed)
 Patient is planning to relocate with her daughter to [place] . She will require transition of care to an oncologist in [place] . Tentative plan is to move in March, though details are not finalized. Patient was advised that she is already scheduled to see Dr. Lonn in May. If relocation occurs, she should call and inform the clinic.

## 2024-09-08 NOTE — Progress Notes (Signed)
 SPIRITUAL CARE AND COUNSELING CONSULT NOTE   VISIT SUMMARY Received call from Deidra with update about her plan to move out of state to live with her daughter, who is her closest family member and a key source of support. Mickaela also processed some extended family conflict and related communication goals. Provided empathic listening and emotional support. Also encouraged investigating local providers to facilitate transfer of care to new location in advance of move.  SPIRITUAL ENCOUNTER                                                                                                                                                                      Type of Visit: Follow up Care provided to:: Patient Referral source: Patient request Reason for visit: Routine spiritual support  SPIRITUAL FRAMEWORK  Presenting Themes: Caregiving needs, Significant life change, Goals in life/care Community/Connection: Limited (Planning to relocate to live with daughter) Patient Stress Factors: Lack of caregivers, Lack of knowledge, Major life changes (Patient unclear about why 52mo f/u with Dr Lonn - encouraged her to inquire)   GOALS   Self/Personal Goals: Move in with daughter for logistical support and meaning-making   INTERVENTIONS   Spiritual Care Interventions Made: Compassionate presence, Reflective listening, Reconciliation with self/others, Meaning making   INTERVENTION OUTCOMES   Outcomes: Reduced isolation  SPIRITUAL CARE PLAN   Follow up plan : Patient plans to phone again before move    Chaplain Olam Filiberto Lemming, [facility] Pager [phone] Voicemail [phone]

## 2024-09-21 NOTE — BH Specialist Note (Unsigned)
 Integrated Behavioral Health Initial In-Person Visit  MRN: [id number] Name: Jordan [name]  Number of Integrated Behavioral Health Clinician visits: Additional Visit  Session Start time: 1416    Session End time: 1529  Total time in minutes: 73  Types of Service: {CHL AMB TYPE OF [phone]}  Interpretor:{yes wn:[id number]} Interpretor Name and Language: ***  Subjective: Jordan [name] is a 58 y.o. female accompanied by {CHL AMB ACCOMPANIED AB:[id number]} Patient was referred by *** for ***. Patient reports the following symptoms/concerns: *** Duration of problem: ***; Severity of problem: {Mild/Moderate/Severe:20260}  Objective: Mood: {BHH MOOD:22306} and Affect: {BHH AFFECT:22307} Risk of harm to self or others: {CHL AMB BH Suicide Current Mental Status:21022748}  Life Context: Family and Social: *** School/Work: *** Self-Care: *** Life Changes: ***  Patient and/or Family's Strengths/Protective Factors: {CHL AMB BH PROTECTIVE FACTORS:[phone]}  Goals Addressed: Patient will: Reduce symptoms of: {IBH Symptoms:21014056} Increase knowledge and/or ability of: {IBH Patient Tools:21014057}  Demonstrate ability to: {IBH Goals:21014053}  Progress towards Goals: {CHL AMB BH PROGRESS TOWARDS GOALS:[phone]}  Interventions: Interventions utilized: {IBH Interventions:21014054}  Standardized Assessments completed: {IBH Screening Tools:21014051}     Patient and/or Family Response: ***  Patient Centered Plan: Patient is on the following Treatment Plan(s):  ***  Clinical Assessment/Diagnosis  No diagnosis found.   Assessment: Patient currently experiencing ***.   Patient may benefit from ***.  Plan: Follow up with behavioral health clinician on : *** Behavioral recommendations: *** Referral(s): {IBH Referrals:21014055}  Jordan [name] Jordan Loureiro, LCSW

## 2024-09-23 ENCOUNTER — Ambulatory Visit: Payer: MEDICAID | Admitting: Clinical

## 2024-09-23 DIAGNOSIS — Z658 Other specified problems related to psychosocial circumstances: Secondary | ICD-10-CM

## 2024-09-23 DIAGNOSIS — F3160 Bipolar disorder, current episode mixed, unspecified: Secondary | ICD-10-CM

## 2024-09-24 NOTE — Patient Instructions (Signed)
 Center for [facility] at [facility] [address] [place], [place] [address] [phone] (main office) [phone] (Adalina Dopson's office)

## 2024-10-08 ENCOUNTER — Telehealth: Payer: Self-pay

## 2024-10-08 NOTE — Telephone Encounter (Signed)
Returned her call and left a message asking her to call the office back. 

## 2024-10-11 ENCOUNTER — Encounter: Payer: Self-pay | Admitting: General Practice

## 2024-10-11 NOTE — Progress Notes (Signed)
 [facility] Spiritual Care Note  Lilias called to notify chaplain of her move to live with her daughter in [place] . Provided reflective listening and encouragement for meeting such a big goal.  Elia Olam Corrigan, MDiv, [facility] Pager [phone] Voicemail [phone]

## 2024-10-12 ENCOUNTER — Telehealth: Payer: Self-pay

## 2024-10-12 NOTE — Telephone Encounter (Signed)
 FYI  The patient requested a transfer of care. She no longer resides in the area, as she has relocated to [place]  with her daughter.

## 2024-10-19 ENCOUNTER — Other Ambulatory Visit: Payer: Self-pay | Admitting: Hematology and Oncology

## 2024-11-04 ENCOUNTER — Encounter: Payer: MEDICAID | Admitting: Physical Therapy

## 2024-11-18 ENCOUNTER — Encounter: Payer: MEDICAID | Admitting: Physical Therapy

## 2024-11-25 ENCOUNTER — Encounter: Payer: MEDICAID | Admitting: Physical Therapy

## 2024-12-02 ENCOUNTER — Encounter: Payer: MEDICAID | Admitting: Physical Therapy

## 2025-01-07 ENCOUNTER — Other Ambulatory Visit: Payer: MEDICAID

## 2025-03-07 ENCOUNTER — Inpatient Hospital Stay: Payer: MEDICAID

## 2025-03-07 ENCOUNTER — Inpatient Hospital Stay: Payer: MEDICAID | Admitting: Hematology and Oncology
